# Patient Record
Sex: Female | Born: 1955 | Race: Black or African American | Hispanic: No | Marital: Single | Smoking: Former smoker
Health system: Southern US, Community
[De-identification: ages and names within clinical notes are randomized; demographics above are authoritative.]

## PROBLEM LIST (undated history)

## (undated) DIAGNOSIS — F99 Mental disorder, not otherwise specified: Secondary | ICD-10-CM

## (undated) DIAGNOSIS — T1491XA Suicide attempt, initial encounter: Secondary | ICD-10-CM

## (undated) DIAGNOSIS — J8410 Pulmonary fibrosis, unspecified: Secondary | ICD-10-CM

## (undated) DIAGNOSIS — M1990 Unspecified osteoarthritis, unspecified site: Secondary | ICD-10-CM

## (undated) DIAGNOSIS — R51 Headache: Secondary | ICD-10-CM

## (undated) DIAGNOSIS — J449 Chronic obstructive pulmonary disease, unspecified: Secondary | ICD-10-CM

## (undated) DIAGNOSIS — K621 Rectal polyp: Secondary | ICD-10-CM

## (undated) DIAGNOSIS — F32A Depression, unspecified: Secondary | ICD-10-CM

## (undated) DIAGNOSIS — C20 Malignant neoplasm of rectum: Secondary | ICD-10-CM

## (undated) DIAGNOSIS — F329 Major depressive disorder, single episode, unspecified: Secondary | ICD-10-CM

## (undated) DIAGNOSIS — K219 Gastro-esophageal reflux disease without esophagitis: Secondary | ICD-10-CM

## (undated) DIAGNOSIS — R52 Pain, unspecified: Secondary | ICD-10-CM

## (undated) DIAGNOSIS — J45909 Unspecified asthma, uncomplicated: Secondary | ICD-10-CM

## (undated) DIAGNOSIS — G629 Polyneuropathy, unspecified: Secondary | ICD-10-CM

## (undated) DIAGNOSIS — I10 Essential (primary) hypertension: Secondary | ICD-10-CM

## (undated) HISTORY — DX: Essential (primary) hypertension: I10

## (undated) HISTORY — PX: TONSILLECTOMY: SUR1361

## (undated) HISTORY — DX: Unspecified osteoarthritis, unspecified site: M19.90

## (undated) HISTORY — PX: TONSILLECTOMY AND ADENOIDECTOMY: SHX28

## (undated) HISTORY — DX: Mental disorder, not otherwise specified: F99

---

## 1998-01-28 ENCOUNTER — Encounter: Admission: RE | Admit: 1998-01-28 | Discharge: 1998-01-28 | Payer: Self-pay | Admitting: *Deleted

## 1998-03-27 ENCOUNTER — Encounter: Admission: RE | Admit: 1998-03-27 | Discharge: 1998-03-27 | Payer: Self-pay | Admitting: Internal Medicine

## 1998-04-10 ENCOUNTER — Encounter: Admission: RE | Admit: 1998-04-10 | Discharge: 1998-04-10 | Payer: Self-pay | Admitting: Internal Medicine

## 1998-04-30 ENCOUNTER — Encounter: Admission: RE | Admit: 1998-04-30 | Discharge: 1998-04-30 | Payer: Self-pay | Admitting: Internal Medicine

## 1998-09-21 ENCOUNTER — Encounter: Admission: RE | Admit: 1998-09-21 | Discharge: 1998-09-21 | Payer: Self-pay | Admitting: Internal Medicine

## 1998-09-24 ENCOUNTER — Ambulatory Visit (HOSPITAL_COMMUNITY): Admission: RE | Admit: 1998-09-24 | Discharge: 1998-09-24 | Payer: Self-pay

## 1998-10-06 ENCOUNTER — Encounter: Admission: RE | Admit: 1998-10-06 | Discharge: 1998-10-06 | Payer: Self-pay | Admitting: Hematology and Oncology

## 1998-10-15 ENCOUNTER — Encounter: Admission: RE | Admit: 1998-10-15 | Discharge: 1998-10-15 | Payer: Self-pay | Admitting: Internal Medicine

## 1999-03-03 ENCOUNTER — Encounter: Admission: RE | Admit: 1999-03-03 | Discharge: 1999-03-03 | Payer: Self-pay | Admitting: Internal Medicine

## 1999-03-03 ENCOUNTER — Encounter: Payer: Self-pay | Admitting: Internal Medicine

## 1999-03-03 ENCOUNTER — Ambulatory Visit (HOSPITAL_COMMUNITY): Admission: RE | Admit: 1999-03-03 | Discharge: 1999-03-03 | Payer: Self-pay | Admitting: Internal Medicine

## 1999-04-07 ENCOUNTER — Ambulatory Visit (HOSPITAL_COMMUNITY): Admission: RE | Admit: 1999-04-07 | Discharge: 1999-04-07 | Payer: Self-pay | Admitting: Internal Medicine

## 1999-04-07 ENCOUNTER — Encounter: Admission: RE | Admit: 1999-04-07 | Discharge: 1999-04-07 | Payer: Self-pay | Admitting: Internal Medicine

## 1999-04-12 ENCOUNTER — Ambulatory Visit (HOSPITAL_COMMUNITY): Admission: RE | Admit: 1999-04-12 | Discharge: 1999-04-12 | Payer: Self-pay | Admitting: *Deleted

## 1999-05-25 ENCOUNTER — Other Ambulatory Visit: Admission: RE | Admit: 1999-05-25 | Discharge: 1999-05-25 | Payer: Self-pay | Admitting: *Deleted

## 1999-05-25 ENCOUNTER — Encounter: Admission: RE | Admit: 1999-05-25 | Discharge: 1999-05-25 | Payer: Self-pay | Admitting: Internal Medicine

## 1999-06-08 ENCOUNTER — Encounter: Admission: RE | Admit: 1999-06-08 | Discharge: 1999-06-08 | Payer: Self-pay | Admitting: Hematology and Oncology

## 1999-06-30 ENCOUNTER — Encounter: Admission: RE | Admit: 1999-06-30 | Discharge: 1999-06-30 | Payer: Self-pay | Admitting: *Deleted

## 1999-07-04 ENCOUNTER — Ambulatory Visit (HOSPITAL_COMMUNITY): Admission: RE | Admit: 1999-07-04 | Discharge: 1999-07-04 | Payer: Self-pay | Admitting: *Deleted

## 1999-11-16 ENCOUNTER — Encounter: Admission: RE | Admit: 1999-11-16 | Discharge: 1999-11-16 | Payer: Self-pay | Admitting: Internal Medicine

## 1999-12-06 ENCOUNTER — Ambulatory Visit (HOSPITAL_COMMUNITY): Admission: RE | Admit: 1999-12-06 | Discharge: 1999-12-06 | Payer: Self-pay | Admitting: *Deleted

## 2000-04-05 ENCOUNTER — Encounter: Admission: RE | Admit: 2000-04-05 | Discharge: 2000-04-05 | Payer: Self-pay | Admitting: Internal Medicine

## 2000-04-10 ENCOUNTER — Ambulatory Visit (HOSPITAL_COMMUNITY): Admission: RE | Admit: 2000-04-10 | Discharge: 2000-04-10 | Payer: Self-pay | Admitting: Internal Medicine

## 2000-04-27 ENCOUNTER — Encounter: Admission: RE | Admit: 2000-04-27 | Discharge: 2000-04-27 | Payer: Self-pay | Admitting: Hematology and Oncology

## 2000-05-26 ENCOUNTER — Encounter: Admission: RE | Admit: 2000-05-26 | Discharge: 2000-05-26 | Payer: Self-pay | Admitting: Internal Medicine

## 2000-06-28 ENCOUNTER — Ambulatory Visit (HOSPITAL_COMMUNITY): Admission: RE | Admit: 2000-06-28 | Discharge: 2000-06-28 | Payer: Self-pay | Admitting: *Deleted

## 2000-08-31 ENCOUNTER — Encounter: Admission: RE | Admit: 2000-08-31 | Discharge: 2000-08-31 | Payer: Self-pay | Admitting: Hematology and Oncology

## 2000-10-13 ENCOUNTER — Encounter: Admission: RE | Admit: 2000-10-13 | Discharge: 2000-10-13 | Payer: Self-pay | Admitting: Internal Medicine

## 2000-10-13 ENCOUNTER — Other Ambulatory Visit: Admission: RE | Admit: 2000-10-13 | Discharge: 2000-10-13 | Payer: Self-pay | Admitting: Internal Medicine

## 2000-10-19 ENCOUNTER — Ambulatory Visit (HOSPITAL_BASED_OUTPATIENT_CLINIC_OR_DEPARTMENT_OTHER): Admission: RE | Admit: 2000-10-19 | Discharge: 2000-10-19 | Payer: Self-pay | Admitting: *Deleted

## 2001-01-22 ENCOUNTER — Encounter: Admission: RE | Admit: 2001-01-22 | Discharge: 2001-01-22 | Payer: Self-pay | Admitting: Internal Medicine

## 2001-04-02 ENCOUNTER — Encounter: Admission: RE | Admit: 2001-04-02 | Discharge: 2001-04-02 | Payer: Self-pay

## 2001-08-10 ENCOUNTER — Encounter: Admission: RE | Admit: 2001-08-10 | Discharge: 2001-08-10 | Payer: Self-pay

## 2001-10-18 ENCOUNTER — Encounter: Admission: RE | Admit: 2001-10-18 | Discharge: 2001-10-18 | Payer: Self-pay | Admitting: Internal Medicine

## 2001-10-23 ENCOUNTER — Encounter: Admission: RE | Admit: 2001-10-23 | Discharge: 2001-10-23 | Payer: Self-pay | Admitting: Internal Medicine

## 2001-12-15 ENCOUNTER — Encounter: Payer: Self-pay | Admitting: Emergency Medicine

## 2001-12-15 ENCOUNTER — Emergency Department (HOSPITAL_COMMUNITY): Admission: EM | Admit: 2001-12-15 | Discharge: 2001-12-15 | Payer: Self-pay | Admitting: Emergency Medicine

## 2002-01-15 ENCOUNTER — Encounter: Admission: RE | Admit: 2002-01-15 | Discharge: 2002-01-15 | Payer: Self-pay | Admitting: Internal Medicine

## 2002-03-11 ENCOUNTER — Emergency Department (HOSPITAL_COMMUNITY): Admission: EM | Admit: 2002-03-11 | Discharge: 2002-03-11 | Payer: Self-pay | Admitting: Emergency Medicine

## 2002-03-11 ENCOUNTER — Encounter: Payer: Self-pay | Admitting: Emergency Medicine

## 2002-03-15 ENCOUNTER — Encounter: Admission: RE | Admit: 2002-03-15 | Discharge: 2002-03-15 | Payer: Self-pay | Admitting: Internal Medicine

## 2002-04-16 ENCOUNTER — Encounter: Admission: RE | Admit: 2002-04-16 | Discharge: 2002-04-16 | Payer: Self-pay | Admitting: Internal Medicine

## 2002-07-02 ENCOUNTER — Encounter: Admission: RE | Admit: 2002-07-02 | Discharge: 2002-07-02 | Payer: Self-pay | Admitting: Internal Medicine

## 2002-07-15 ENCOUNTER — Encounter: Admission: RE | Admit: 2002-07-15 | Discharge: 2002-07-15 | Payer: Self-pay | Admitting: Internal Medicine

## 2002-08-07 ENCOUNTER — Encounter: Admission: RE | Admit: 2002-08-07 | Discharge: 2002-08-07 | Payer: Self-pay | Admitting: Internal Medicine

## 2002-08-26 ENCOUNTER — Encounter: Admission: RE | Admit: 2002-08-26 | Discharge: 2002-08-26 | Payer: Self-pay | Admitting: Internal Medicine

## 2002-08-28 ENCOUNTER — Ambulatory Visit (HOSPITAL_COMMUNITY): Admission: RE | Admit: 2002-08-28 | Discharge: 2002-08-28 | Payer: Self-pay | Admitting: *Deleted

## 2002-09-04 ENCOUNTER — Ambulatory Visit (HOSPITAL_COMMUNITY): Admission: RE | Admit: 2002-09-04 | Discharge: 2002-09-04 | Payer: Self-pay | Admitting: Internal Medicine

## 2002-11-25 ENCOUNTER — Encounter: Admission: RE | Admit: 2002-11-25 | Discharge: 2002-11-25 | Payer: Self-pay | Admitting: Internal Medicine

## 2002-12-02 ENCOUNTER — Encounter: Admission: RE | Admit: 2002-12-02 | Discharge: 2002-12-02 | Payer: Self-pay | Admitting: Internal Medicine

## 2003-02-24 ENCOUNTER — Emergency Department (HOSPITAL_COMMUNITY): Admission: EM | Admit: 2003-02-24 | Discharge: 2003-02-25 | Payer: Self-pay | Admitting: Emergency Medicine

## 2003-03-23 ENCOUNTER — Emergency Department (HOSPITAL_COMMUNITY): Admission: EM | Admit: 2003-03-23 | Discharge: 2003-03-24 | Payer: Self-pay | Admitting: Emergency Medicine

## 2003-03-23 ENCOUNTER — Encounter: Payer: Self-pay | Admitting: Emergency Medicine

## 2003-03-24 ENCOUNTER — Encounter: Admission: RE | Admit: 2003-03-24 | Discharge: 2003-03-24 | Payer: Self-pay | Admitting: Internal Medicine

## 2003-04-02 ENCOUNTER — Emergency Department (HOSPITAL_COMMUNITY): Admission: AD | Admit: 2003-04-02 | Discharge: 2003-04-02 | Payer: Self-pay | Admitting: Family Medicine

## 2003-04-02 ENCOUNTER — Encounter: Payer: Self-pay | Admitting: Family Medicine

## 2003-04-07 ENCOUNTER — Encounter: Admission: RE | Admit: 2003-04-07 | Discharge: 2003-04-07 | Payer: Self-pay | Admitting: Internal Medicine

## 2004-01-09 ENCOUNTER — Ambulatory Visit (HOSPITAL_COMMUNITY): Admission: RE | Admit: 2004-01-09 | Discharge: 2004-01-09 | Payer: Self-pay | Admitting: Internal Medicine

## 2004-01-09 ENCOUNTER — Encounter: Admission: RE | Admit: 2004-01-09 | Discharge: 2004-01-09 | Payer: Self-pay | Admitting: Internal Medicine

## 2004-02-12 ENCOUNTER — Ambulatory Visit (HOSPITAL_COMMUNITY): Admission: RE | Admit: 2004-02-12 | Discharge: 2004-02-12 | Payer: Self-pay | Admitting: Internal Medicine

## 2004-03-26 ENCOUNTER — Ambulatory Visit: Payer: Self-pay | Admitting: Internal Medicine

## 2004-08-26 ENCOUNTER — Ambulatory Visit: Payer: Self-pay | Admitting: Internal Medicine

## 2004-11-04 ENCOUNTER — Ambulatory Visit: Payer: Self-pay | Admitting: Internal Medicine

## 2005-12-08 ENCOUNTER — Ambulatory Visit: Payer: Self-pay | Admitting: Internal Medicine

## 2005-12-08 ENCOUNTER — Encounter (INDEPENDENT_AMBULATORY_CARE_PROVIDER_SITE_OTHER): Payer: Self-pay | Admitting: Internal Medicine

## 2008-01-24 ENCOUNTER — Emergency Department (HOSPITAL_COMMUNITY): Admission: EM | Admit: 2008-01-24 | Discharge: 2008-01-24 | Payer: Self-pay | Admitting: Family Medicine

## 2008-01-24 IMAGING — CR DG CHEST 2V
2 series · 2 of 2 positions shown · non-contrast
Comparison: [DATE] study report

CLINICAL DATA: History given of shortness of breath, chest pain,
and palpitations.  History of hypertension and tobacco smoking.

CHEST - 2 VIEW

[w chest pa]
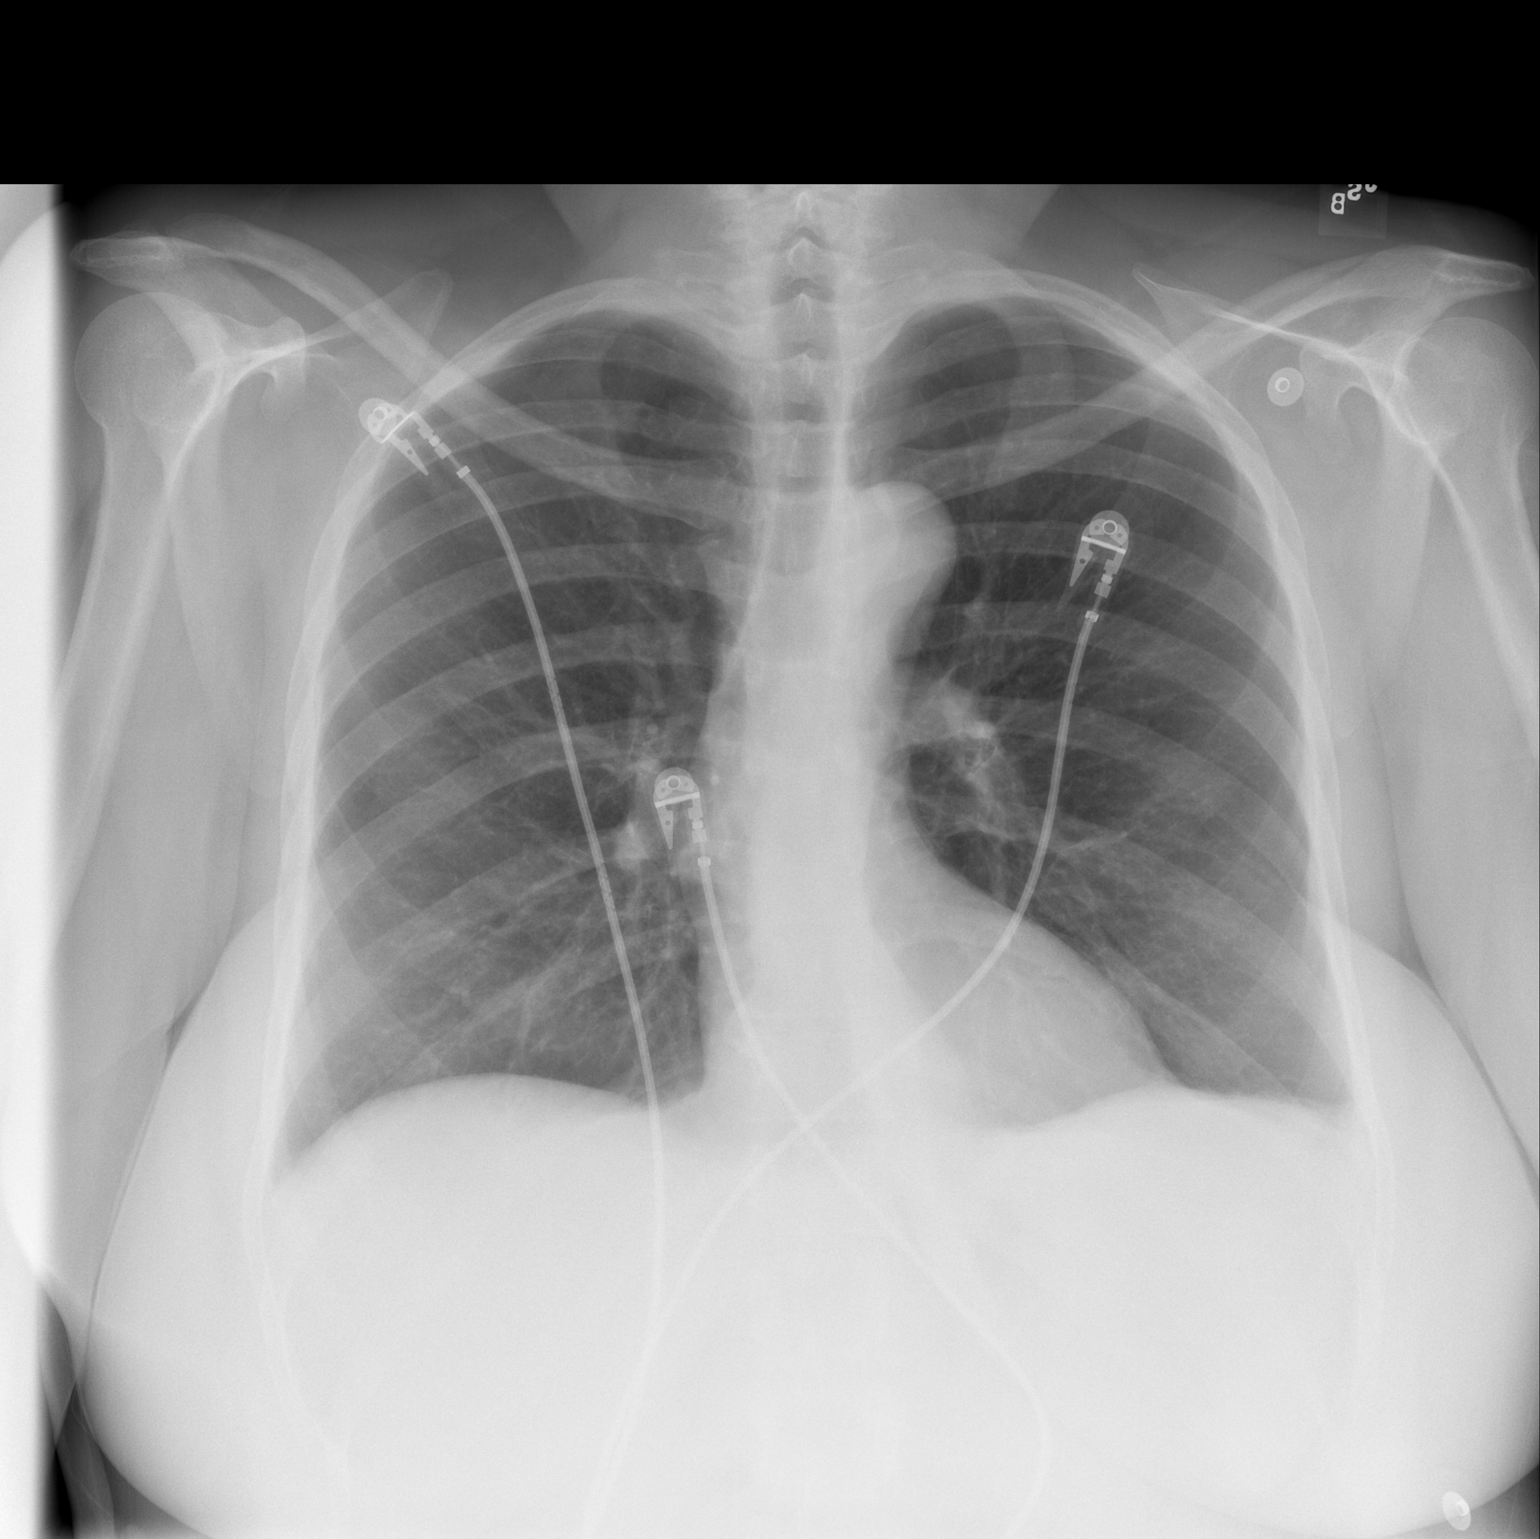

[w chest lat]
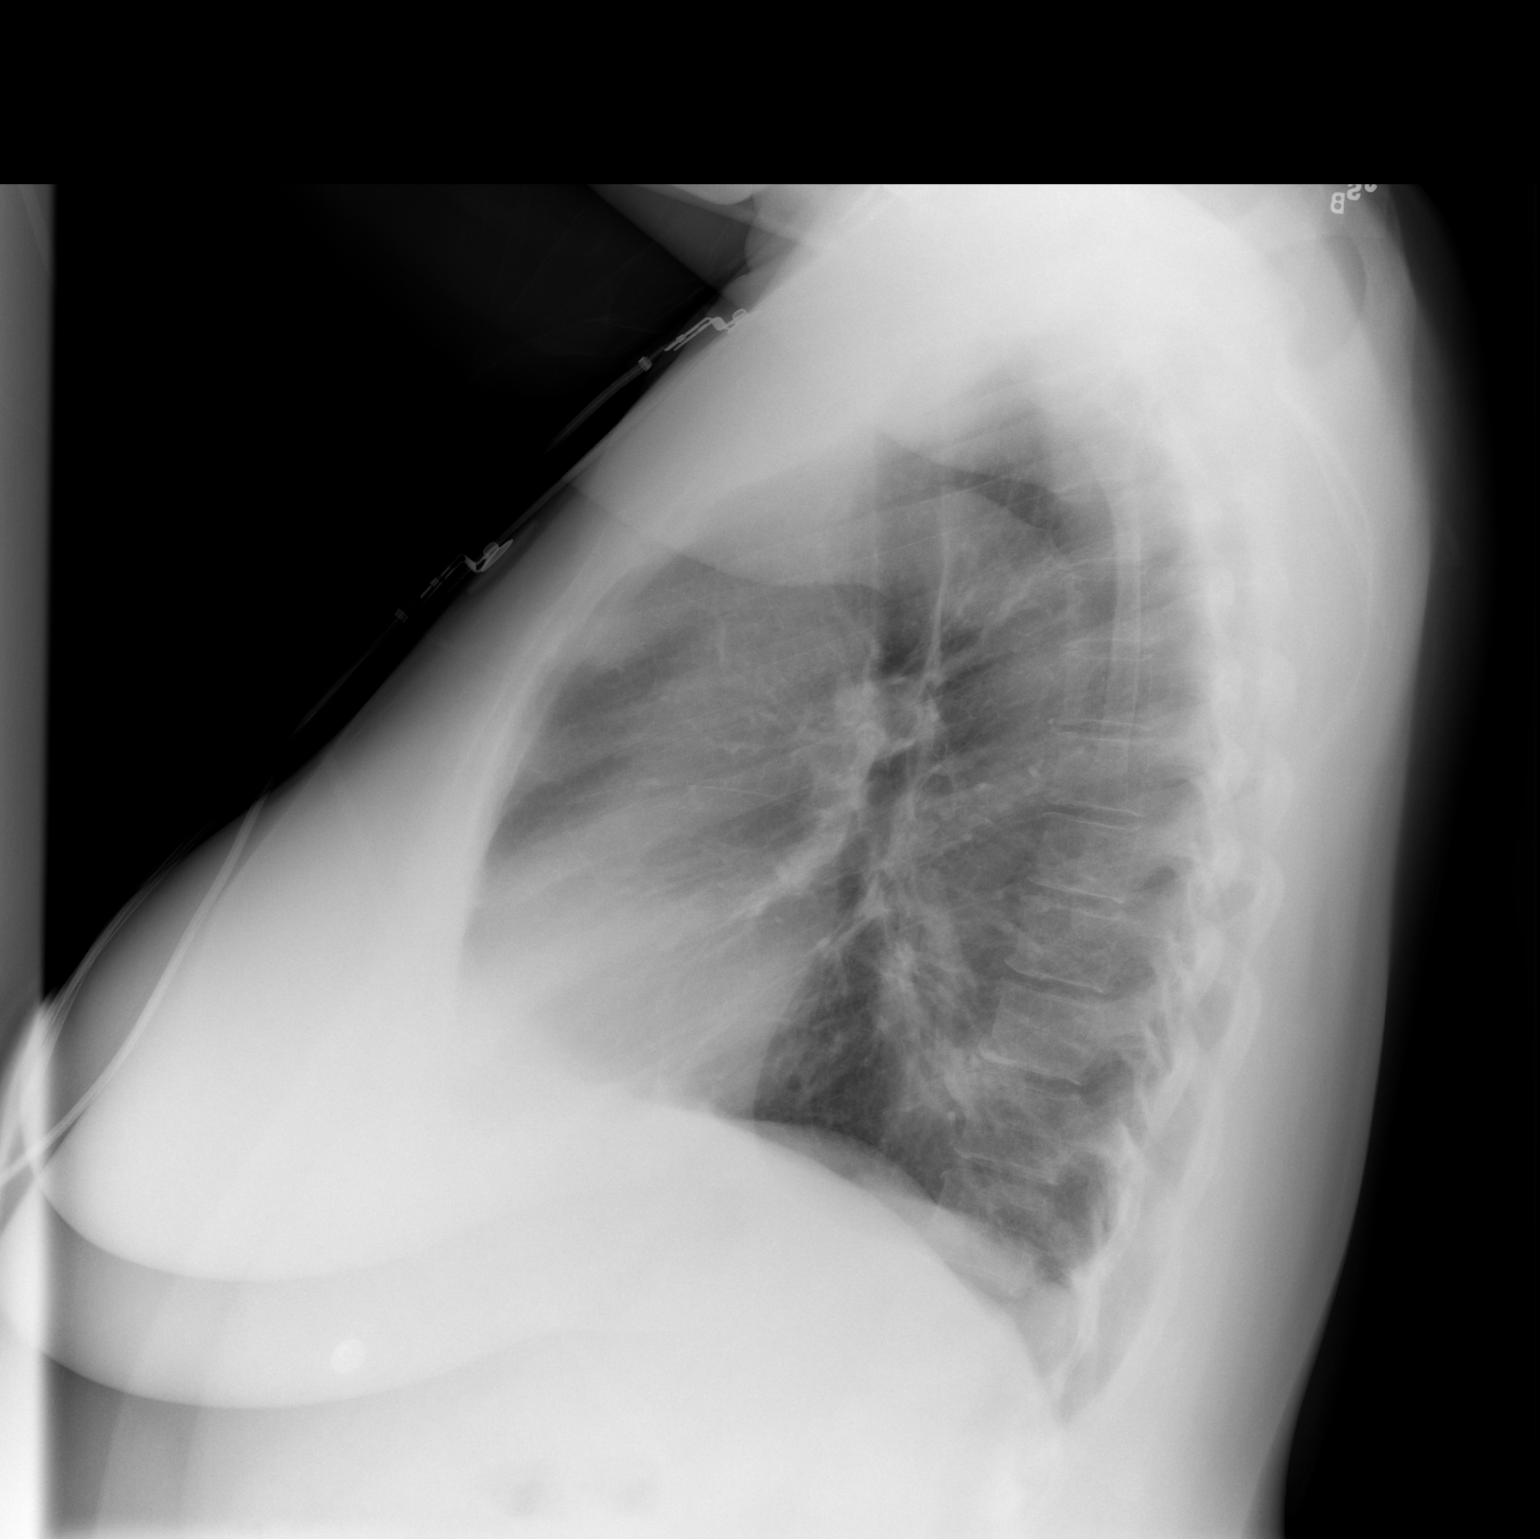

[2 of 2 positions shown; findings below may reference images not displayed]

FINDINGS: Cardiac silhouette is not enlarged.  The lungs are free
of infiltrates.  No pleural effusion is seen.  Bones appear average
for age.  There is tortuosity of the thoracic aorta.
IMPRESSION: No acute process seen.

## 2008-01-24 IMAGING — CR DG KNEE COMPLETE 4+V*R*
4 series · 4 of 4 positions shown · non-contrast
Comparison: None

CLINICAL DATA: Knee pain status post fall.

RIGHT KNEE - COMPLETE 4+ VIEW

[t knee ap right]
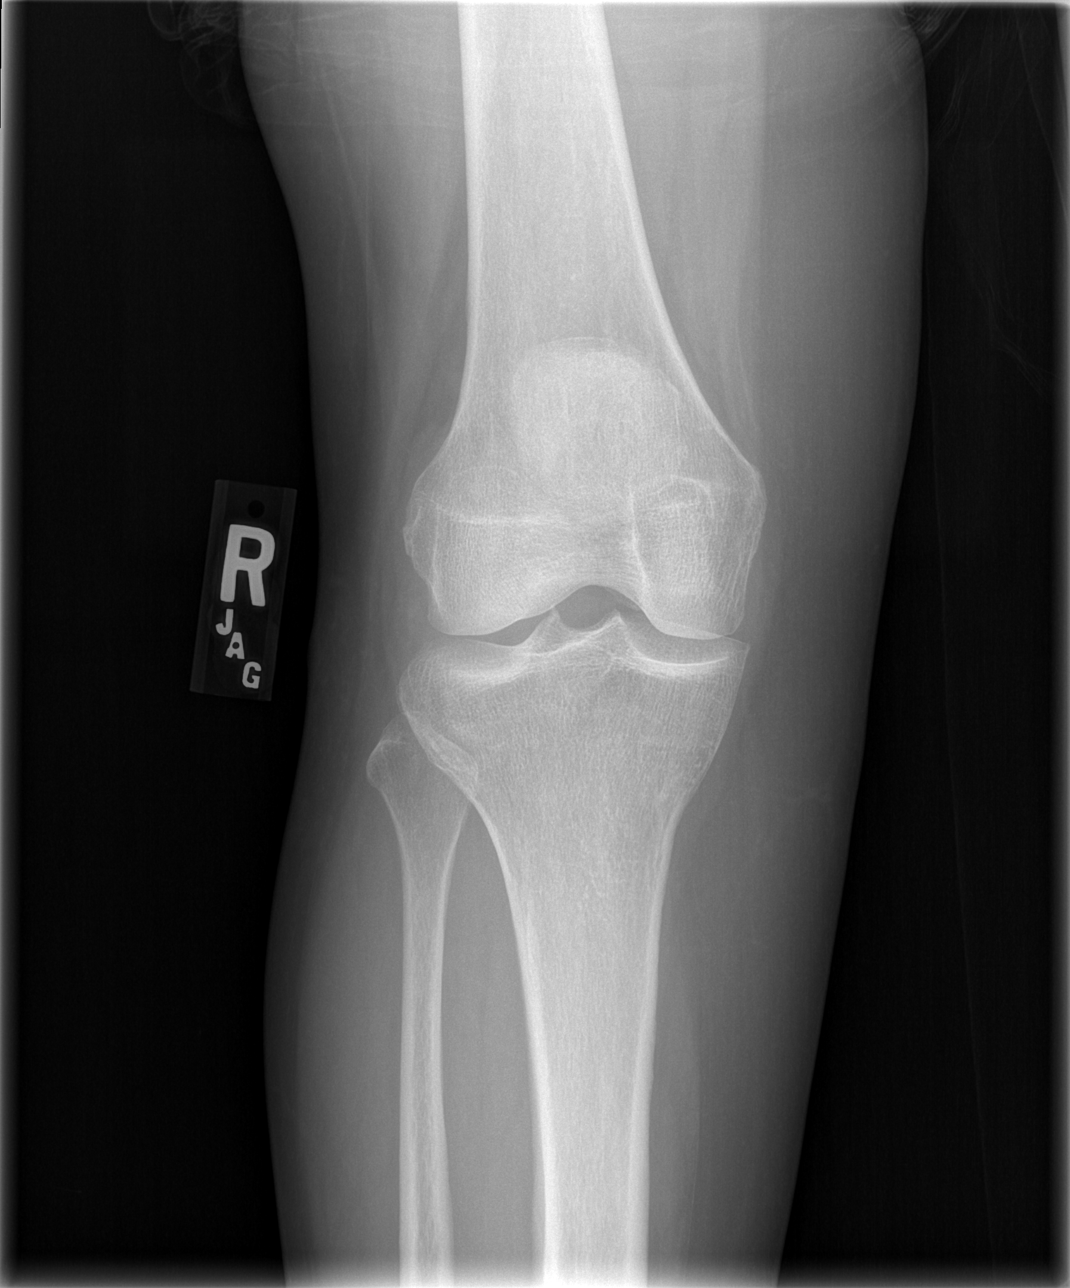

[t knee oblique right (1 of 2)]
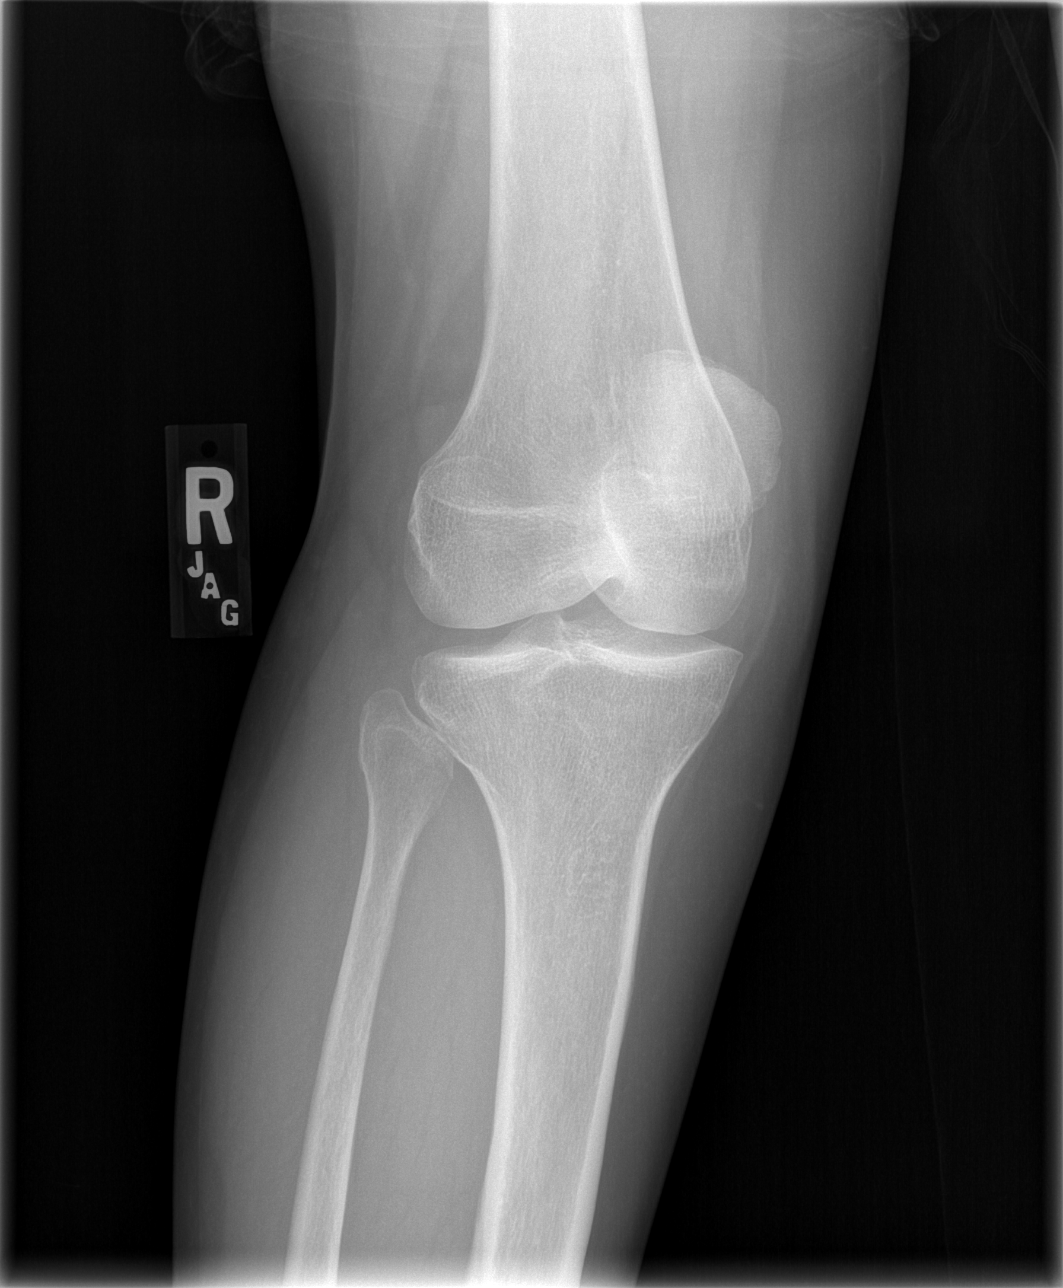

[t knee oblique right (2 of 2)]
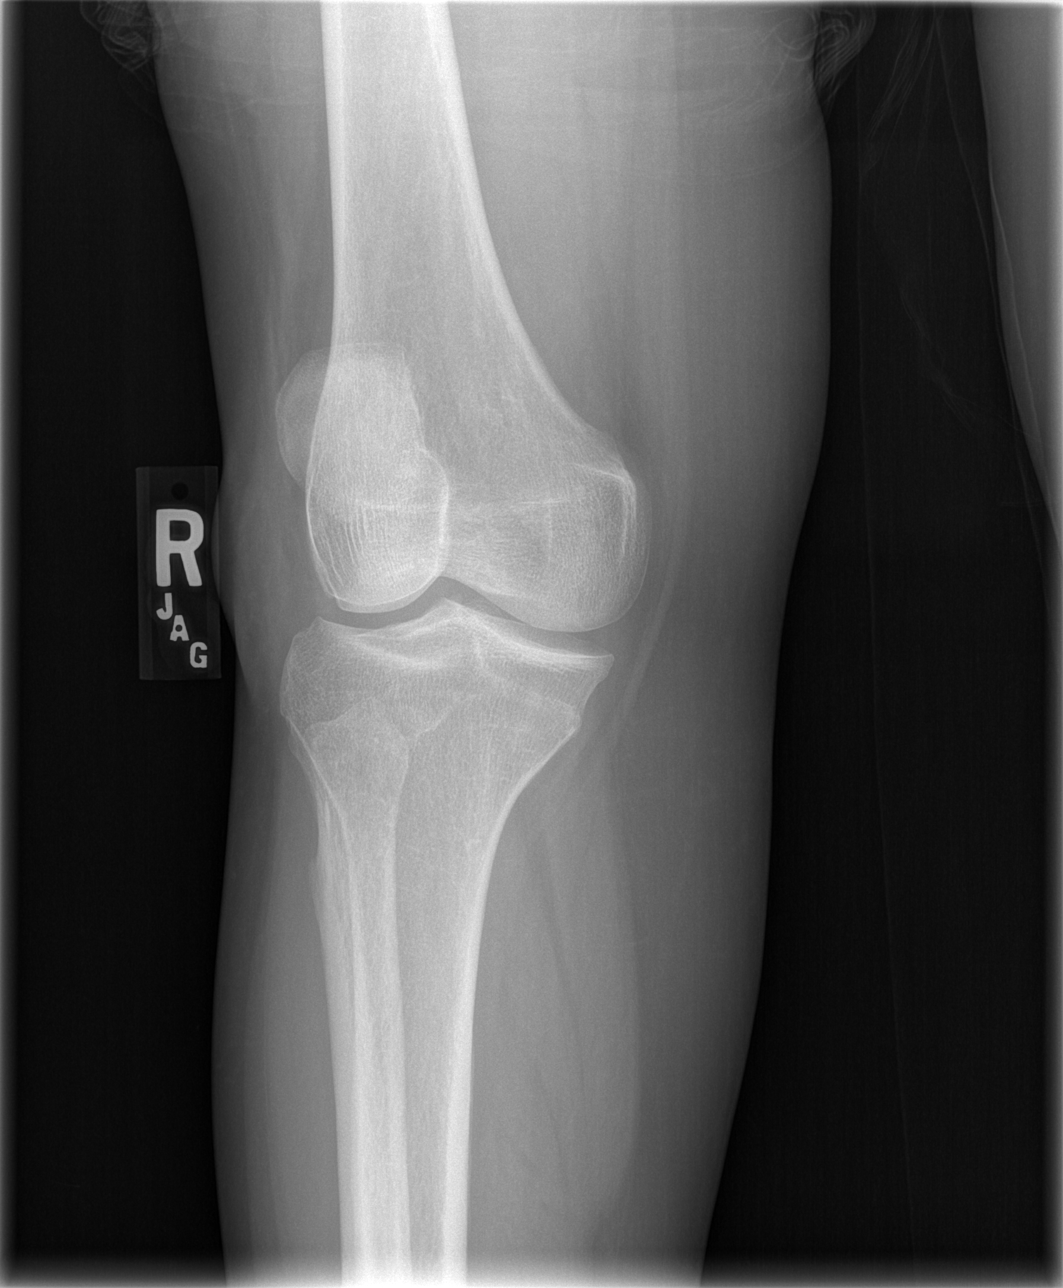

[t knee lat right]
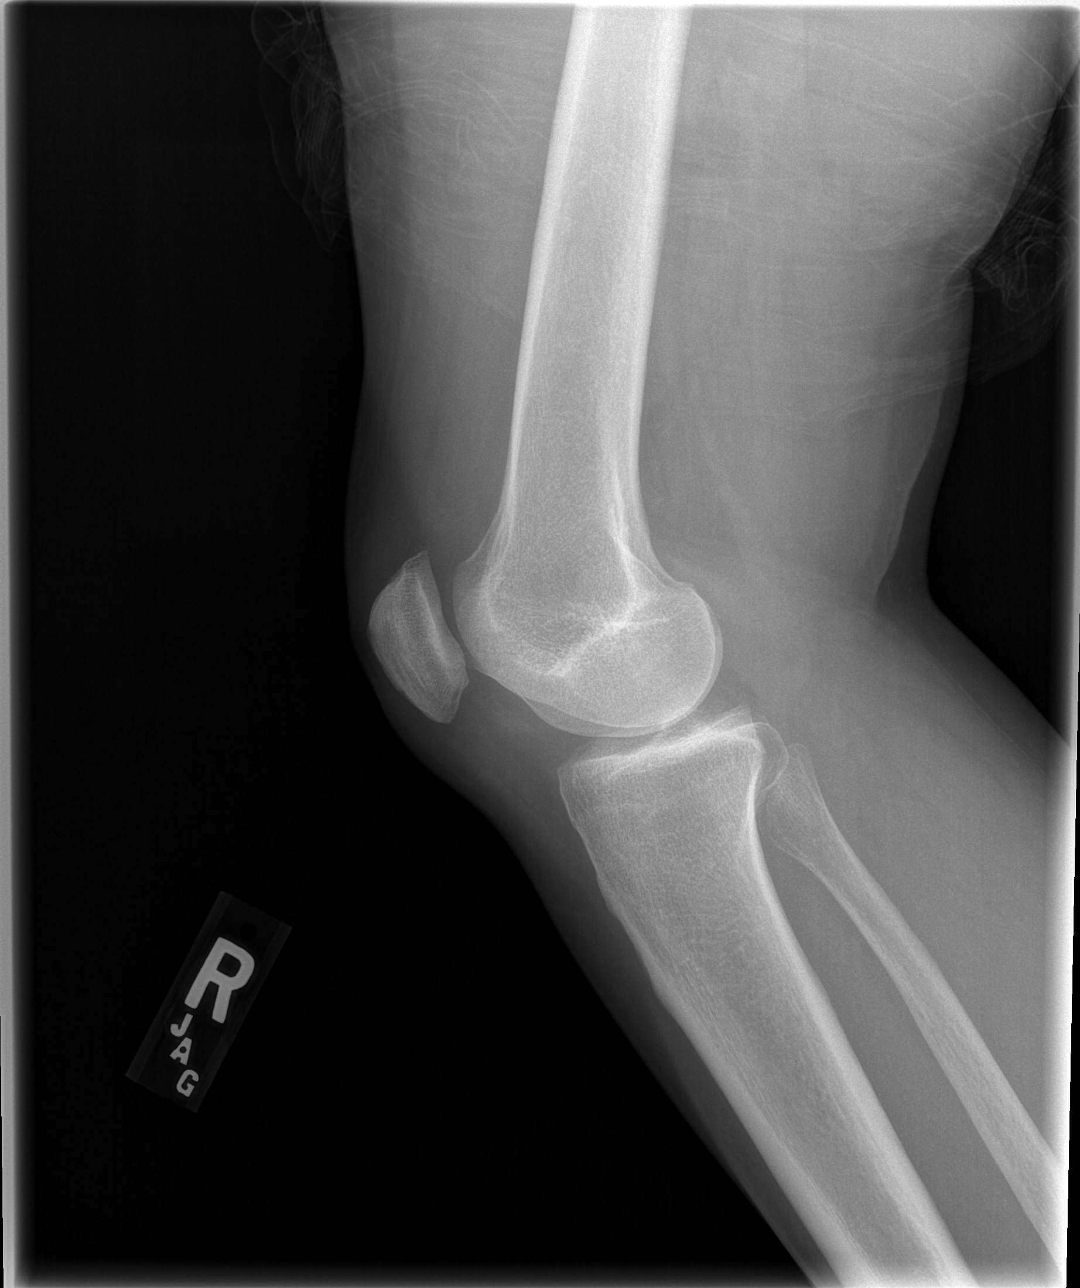

[4 of 4 positions shown; findings below may reference images not displayed]

FINDINGS: There is a small knee joint effusion.  No acute fracture
or dislocation is demonstrated.  The joint spaces appear preserved.
IMPRESSION: Small knee joint effusion.  No acute osseous findings.

## 2008-01-28 ENCOUNTER — Ambulatory Visit: Payer: Self-pay | Admitting: Family Medicine

## 2008-01-28 ENCOUNTER — Ambulatory Visit: Payer: Self-pay | Admitting: *Deleted

## 2008-01-28 LAB — CONVERTED CEMR LAB: Microalb, Ur: 3.57 mg/dL — ABNORMAL HIGH (ref 0.00–1.89)

## 2008-02-01 ENCOUNTER — Ambulatory Visit: Payer: Self-pay | Admitting: Internal Medicine

## 2008-02-05 ENCOUNTER — Ambulatory Visit: Payer: Self-pay | Admitting: Family Medicine

## 2008-06-09 ENCOUNTER — Ambulatory Visit: Payer: Self-pay | Admitting: Family Medicine

## 2008-07-15 ENCOUNTER — Ambulatory Visit: Payer: Self-pay | Admitting: Internal Medicine

## 2008-08-21 ENCOUNTER — Ambulatory Visit: Payer: Self-pay | Admitting: Internal Medicine

## 2008-08-21 ENCOUNTER — Encounter (INDEPENDENT_AMBULATORY_CARE_PROVIDER_SITE_OTHER): Payer: Self-pay | Admitting: Family Medicine

## 2008-08-21 LAB — CONVERTED CEMR LAB
ALT: 11 units/L (ref 0–35)
AST: 14 units/L (ref 0–37)
Albumin: 4.1 g/dL (ref 3.5–5.2)
Alkaline Phosphatase: 66 units/L (ref 39–117)
BUN: 15 mg/dL (ref 6–23)
Basophils Absolute: 0 10*3/uL (ref 0.0–0.1)
Basophils Relative: 0 % (ref 0–1)
CO2: 21 meq/L (ref 19–32)
Calcium: 9 mg/dL (ref 8.4–10.5)
Chloride: 109 meq/L (ref 96–112)
Cholesterol: 148 mg/dL (ref 0–200)
Creatinine, Ser: 1.18 mg/dL (ref 0.40–1.20)
Eosinophils Absolute: 0.1 10*3/uL (ref 0.0–0.7)
Eosinophils Relative: 3 % (ref 0–5)
Glucose, Bld: 101 mg/dL — ABNORMAL HIGH (ref 70–99)
HCT: 31.4 % — ABNORMAL LOW (ref 36.0–46.0)
HDL: 56 mg/dL (ref >39)
Hemoglobin: 11.3 g/dL — ABNORMAL LOW (ref 12.0–15.0)
LDL Cholesterol: 67 mg/dL (ref 0–99)
Lymphocytes Relative: 26 % (ref 12–46)
Lymphs Abs: 1.1 10*3/uL (ref 0.7–4.0)
MCHC: 36 g/dL (ref 30.0–36.0)
MCV: 92.4 fL (ref 78.0–100.0)
Monocytes Absolute: 0.3 10*3/uL (ref 0.1–1.0)
Monocytes Relative: 6 % (ref 3–12)
Neutro Abs: 2.6 10*3/uL (ref 1.7–7.7)
Neutrophils Relative %: 65 % (ref 43–77)
Platelets: 185 10*3/uL (ref 150–400)
Potassium: 3.5 meq/L (ref 3.5–5.3)
RBC: 3.4 M/uL — ABNORMAL LOW (ref 3.87–5.11)
RDW: 15.5 % (ref 11.5–15.5)
Sodium: 143 meq/L (ref 135–145)
TSH: 2.562 microintl units/mL (ref 0.350–4.500)
Total Bilirubin: 1.4 mg/dL — ABNORMAL HIGH (ref 0.3–1.2)
Total CHOL/HDL Ratio: 2.6
Total Protein: 7.1 g/dL (ref 6.0–8.3)
Triglycerides: 127 mg/dL (ref <150)
VLDL: 25 mg/dL (ref 0–40)
Vit D, 25-Hydroxy: 7 ng/mL — ABNORMAL LOW (ref 30–89)
WBC: 4.1 10*3/uL (ref 4.0–10.5)

## 2008-09-26 ENCOUNTER — Encounter (INDEPENDENT_AMBULATORY_CARE_PROVIDER_SITE_OTHER): Payer: Self-pay | Admitting: Family Medicine

## 2008-09-26 ENCOUNTER — Ambulatory Visit: Payer: Self-pay | Admitting: Family Medicine

## 2008-10-15 ENCOUNTER — Ambulatory Visit (HOSPITAL_COMMUNITY): Admission: RE | Admit: 2008-10-15 | Discharge: 2008-10-15 | Payer: Self-pay | Admitting: Family Medicine

## 2008-10-15 IMAGING — MG MM DIGITAL SCREENING BILAT W/ CAD
5 series · 5 of 5 positions shown · non-contrast
Comparison: none

DG SCREEN MAMMOGRAM BILATERAL
Bilateral CC and MLO view(s) were taken.
Technologist: PACHECO ARAYA, RT, RM

DIGITAL SCREENING MAMMOGRAM WITH CAD:
The breast tissue is almost entirely fatty.  No masses or malignant type calcifications are 
identified.  Compared with prior studies.

[R CC]
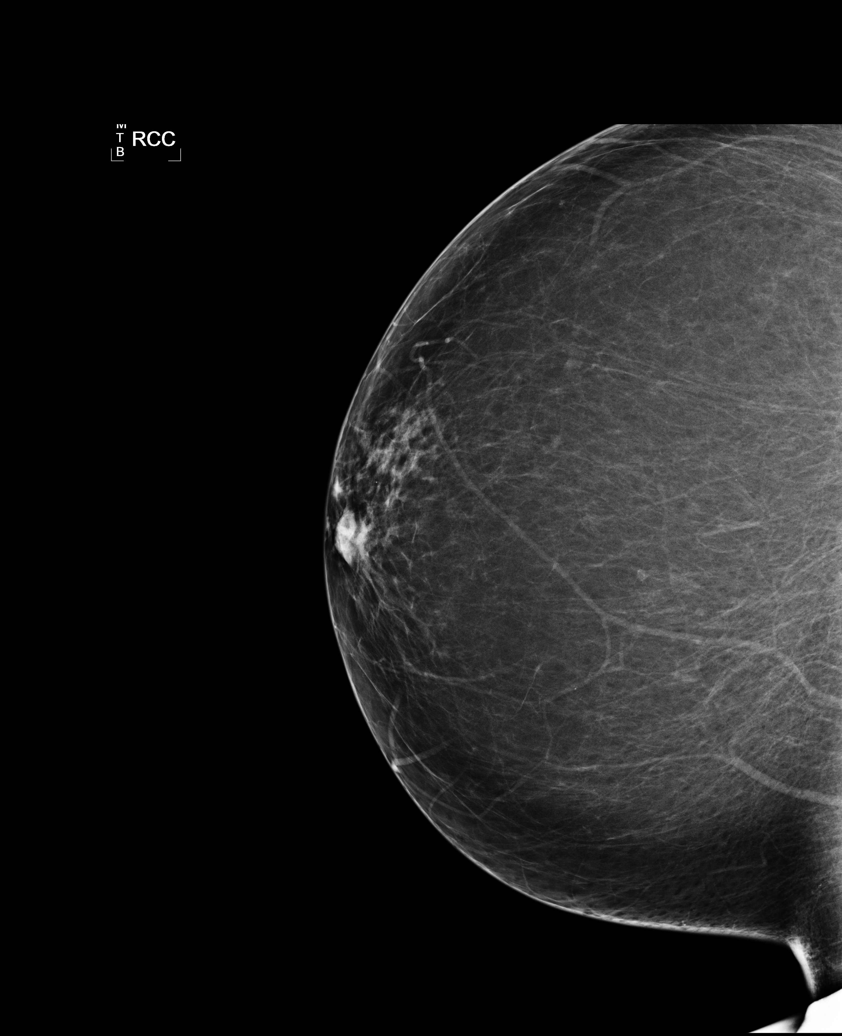

[R MLO]
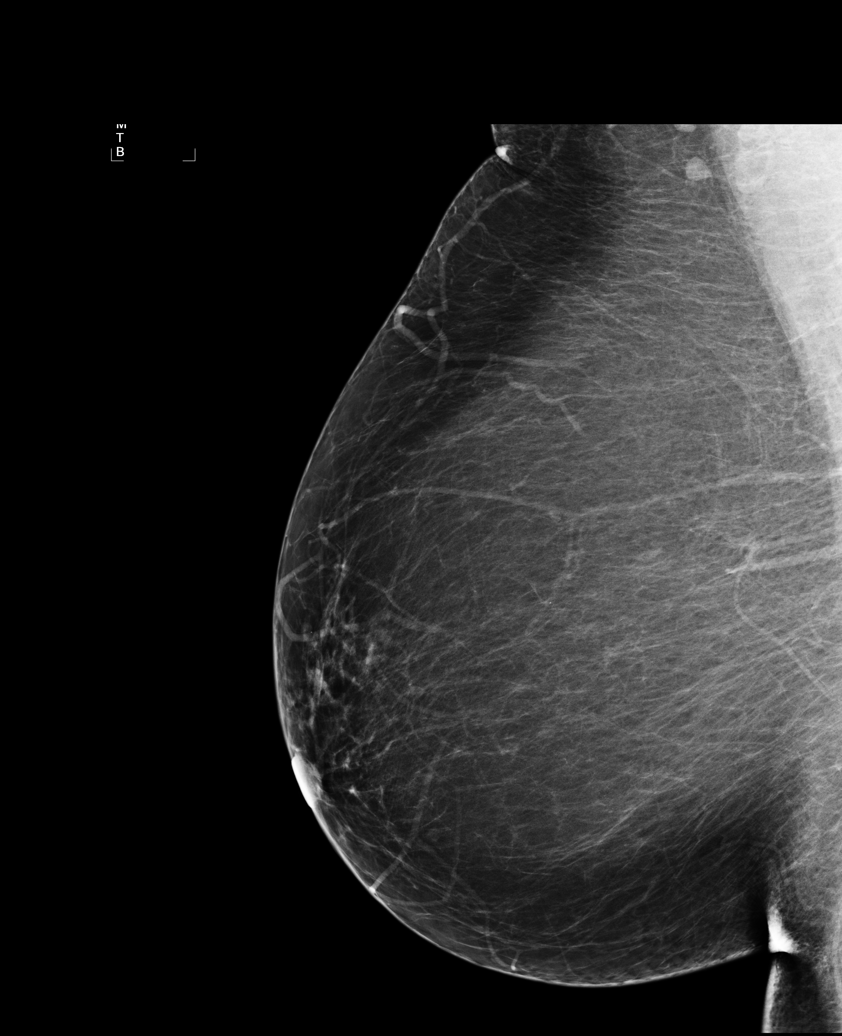

[L CC]
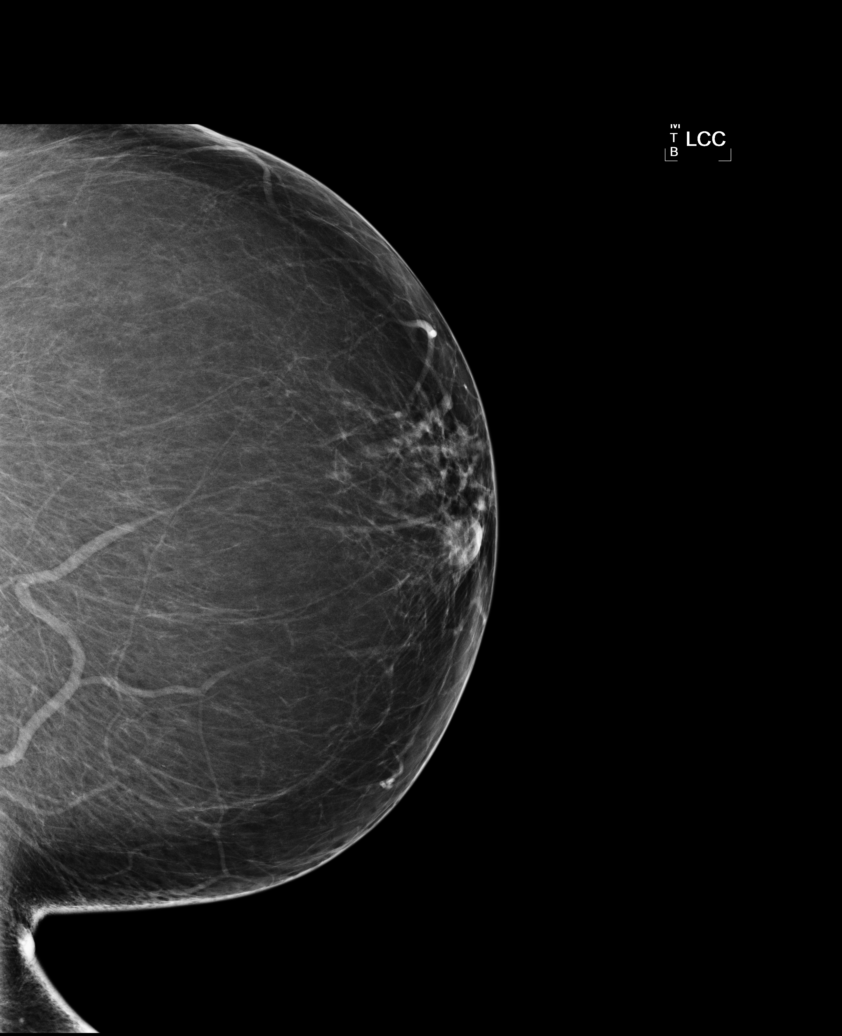

[L MLO (1 of 2)]
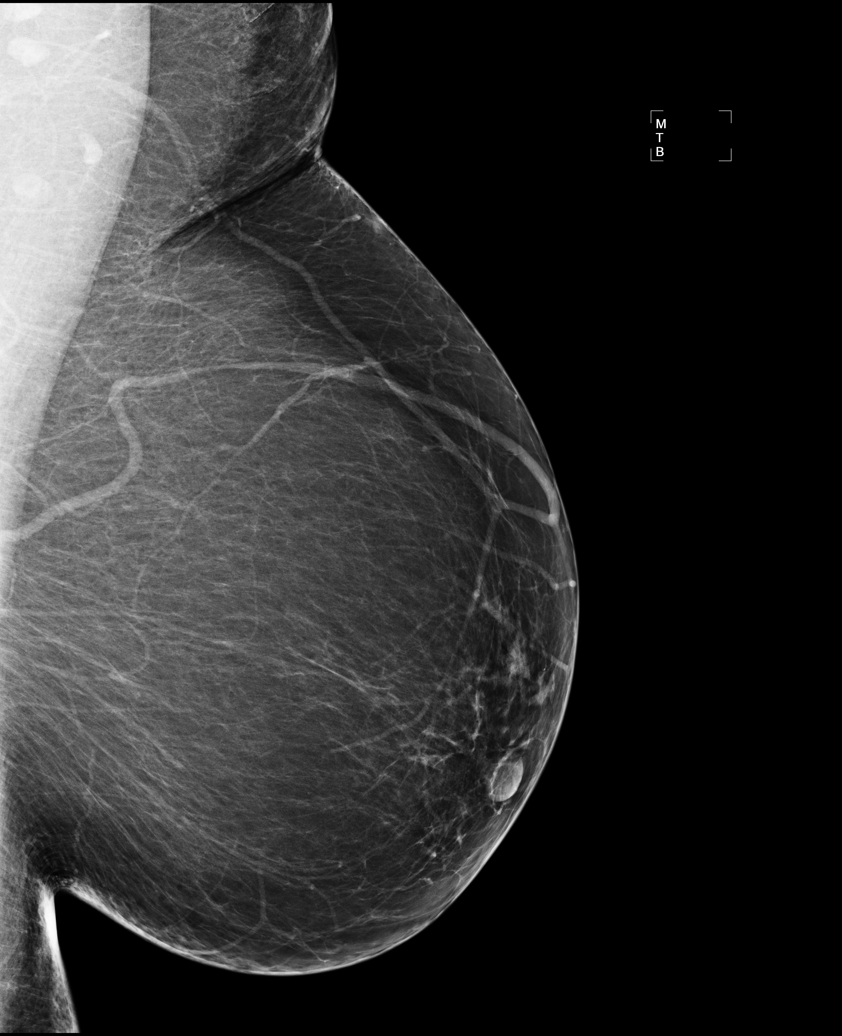

[L MLO (2 of 2)]
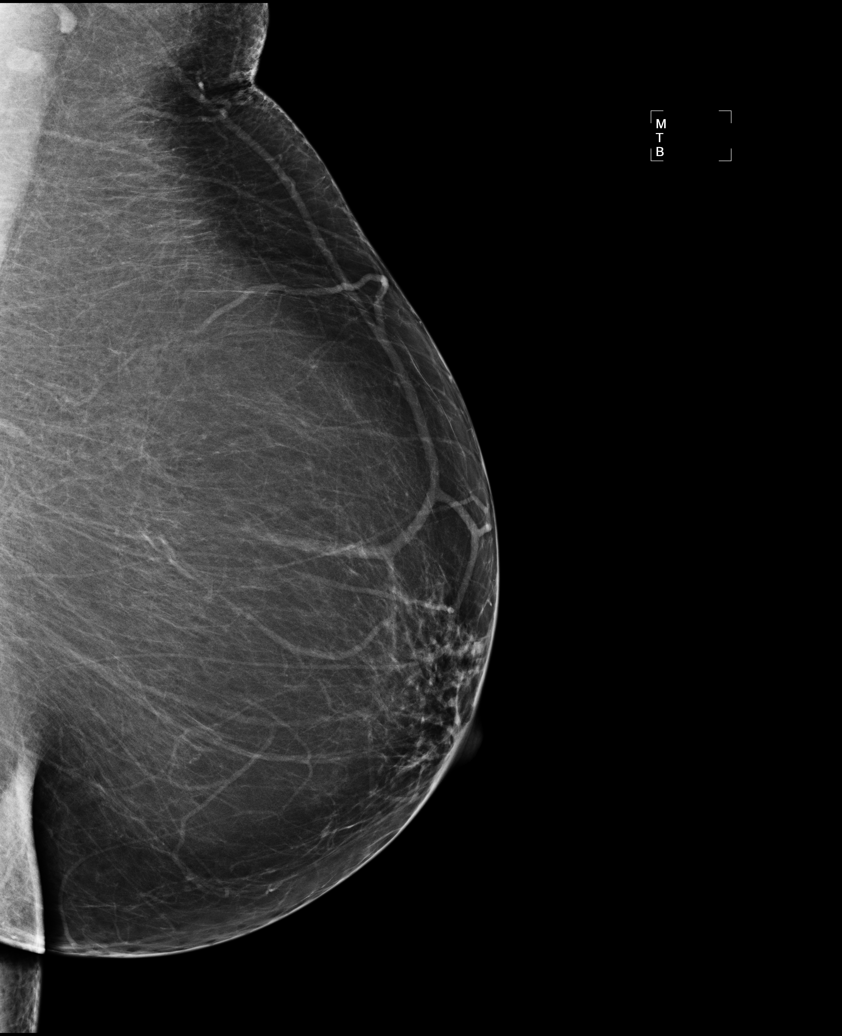

[5 of 5 positions shown; findings below may reference images not displayed]

IMPRESSION: No specific mammographic evidence of malignancy.  Next screening mammogram is recommended in one 
year.

A result letter of this screening mammogram will be mailed directly to the patient.

ASSESSMENT: Negative - BI-RADS 1

Screening mammogram in 1 year.
ANALYZED BY COMPUTER AIDED DETECTION. , THIS PROCEDURE WAS A DIGITAL MAMMOGRAM.

## 2009-01-16 ENCOUNTER — Ambulatory Visit: Payer: Self-pay | Admitting: Family Medicine

## 2009-01-16 LAB — CONVERTED CEMR LAB
Microalb, Ur: 4.69 mg/dL — ABNORMAL HIGH (ref 0.00–1.89)
Sed Rate: 8 mm/hr (ref 0–22)
Vit D, 25-Hydroxy: 29 ng/mL — ABNORMAL LOW (ref 30–89)

## 2009-01-17 ENCOUNTER — Encounter (INDEPENDENT_AMBULATORY_CARE_PROVIDER_SITE_OTHER): Payer: Self-pay | Admitting: Family Medicine

## 2009-04-14 ENCOUNTER — Ambulatory Visit: Payer: Self-pay | Admitting: Internal Medicine

## 2009-08-11 ENCOUNTER — Ambulatory Visit: Payer: Self-pay | Admitting: Internal Medicine

## 2009-08-18 ENCOUNTER — Ambulatory Visit: Payer: Self-pay | Admitting: Family Medicine

## 2009-08-26 ENCOUNTER — Ambulatory Visit: Payer: Self-pay | Admitting: Internal Medicine

## 2009-08-26 ENCOUNTER — Ambulatory Visit (HOSPITAL_COMMUNITY): Admission: RE | Admit: 2009-08-26 | Discharge: 2009-08-26 | Payer: Self-pay | Admitting: Family Medicine

## 2009-08-26 IMAGING — CR DG FOOT COMPLETE 3+V*R*
3 series · 3 of 3 positions shown · non-contrast
Comparison: None

CLINICAL DATA: Bilateral foot pain

RIGHT FOOT COMPLETE - 3+ VIEW

[t foot ap right]
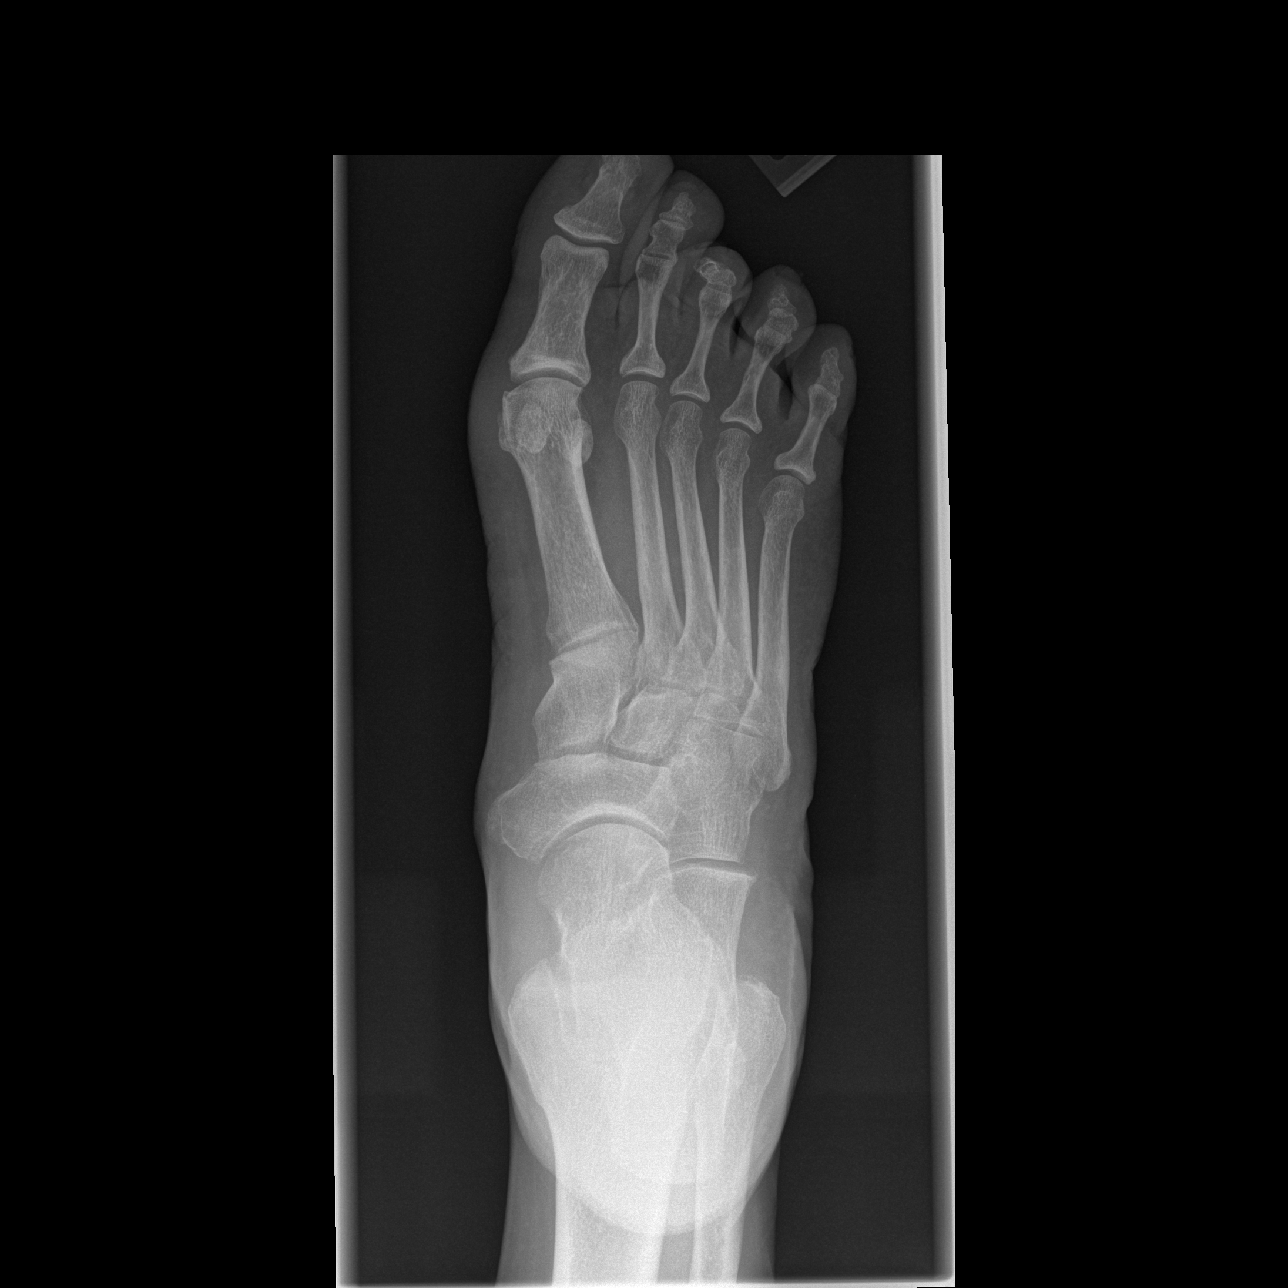

[t foot oblique right]
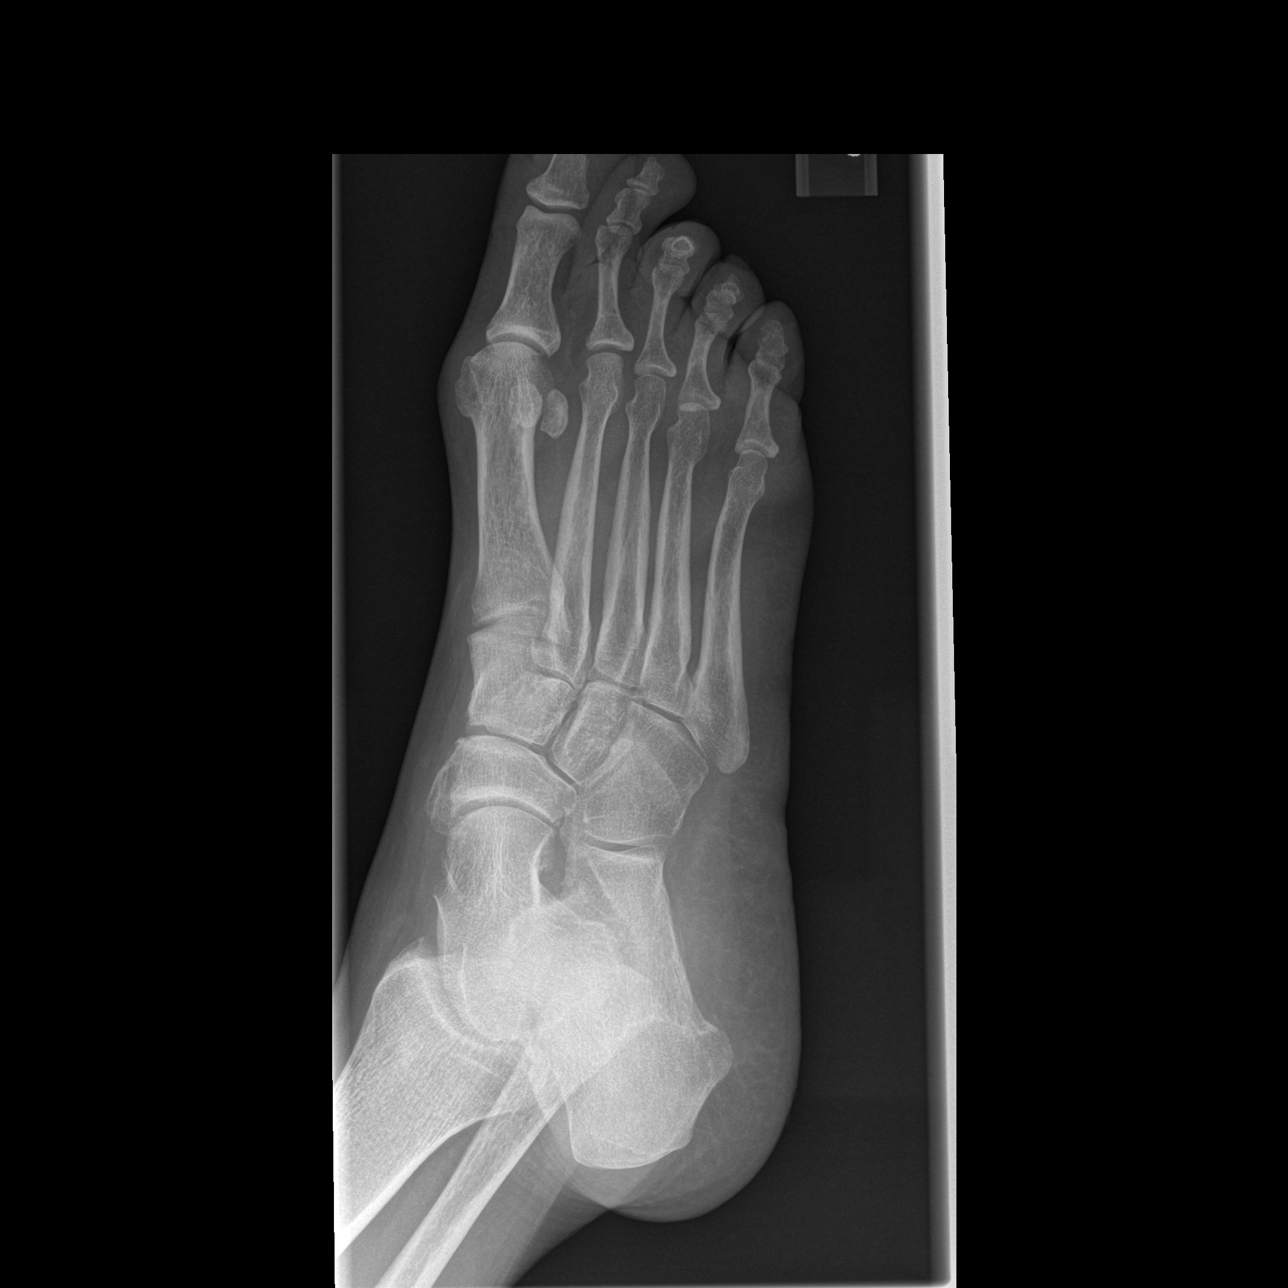

[t foot lat right]
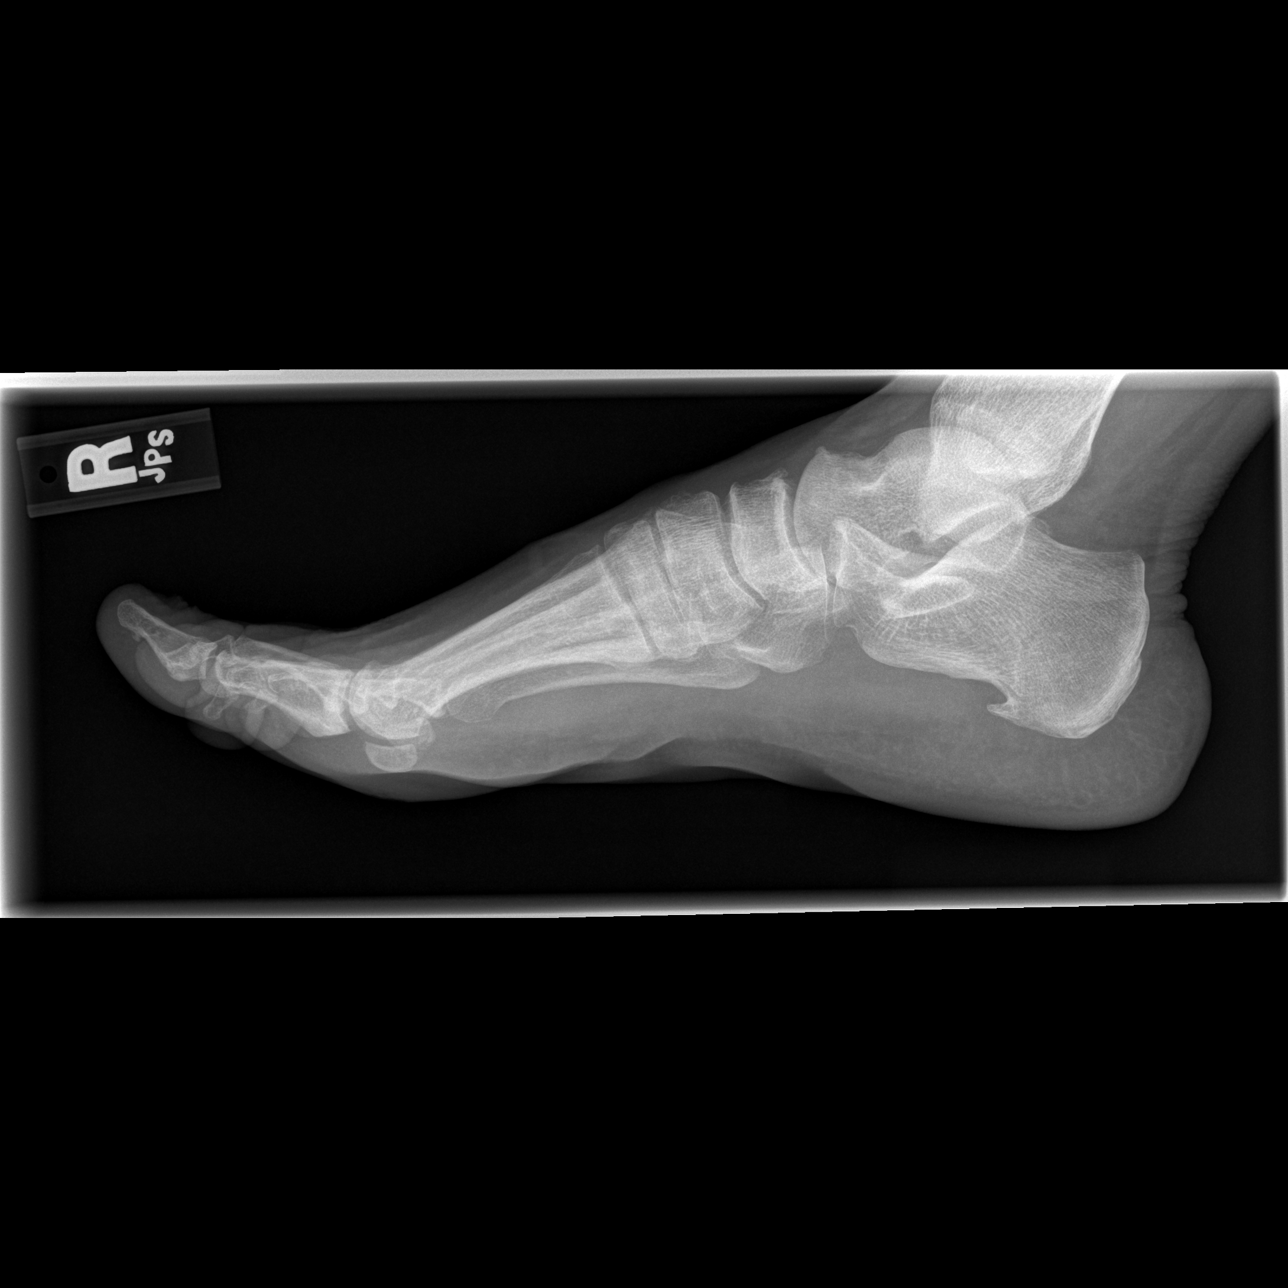

[3 of 3 positions shown; findings below may reference images not displayed]

FINDINGS: No fracture or dislocation.  There is mild to moderate
degenerative change of the first MTP joint.  A calcaneal spur is
noted.
IMPRESSION: 1.  Degenerative changes of the first MTP joint and calcaneal spur.
2.  No acute findings.

## 2009-08-26 IMAGING — CR DG FOOT COMPLETE 3+V*L*
3 series · 3 of 3 positions shown · non-contrast
Comparison: None

CLINICAL DATA: Bilateral foot pain

LEFT FOOT - COMPLETE 3+ VIEW

[t foot ap left]
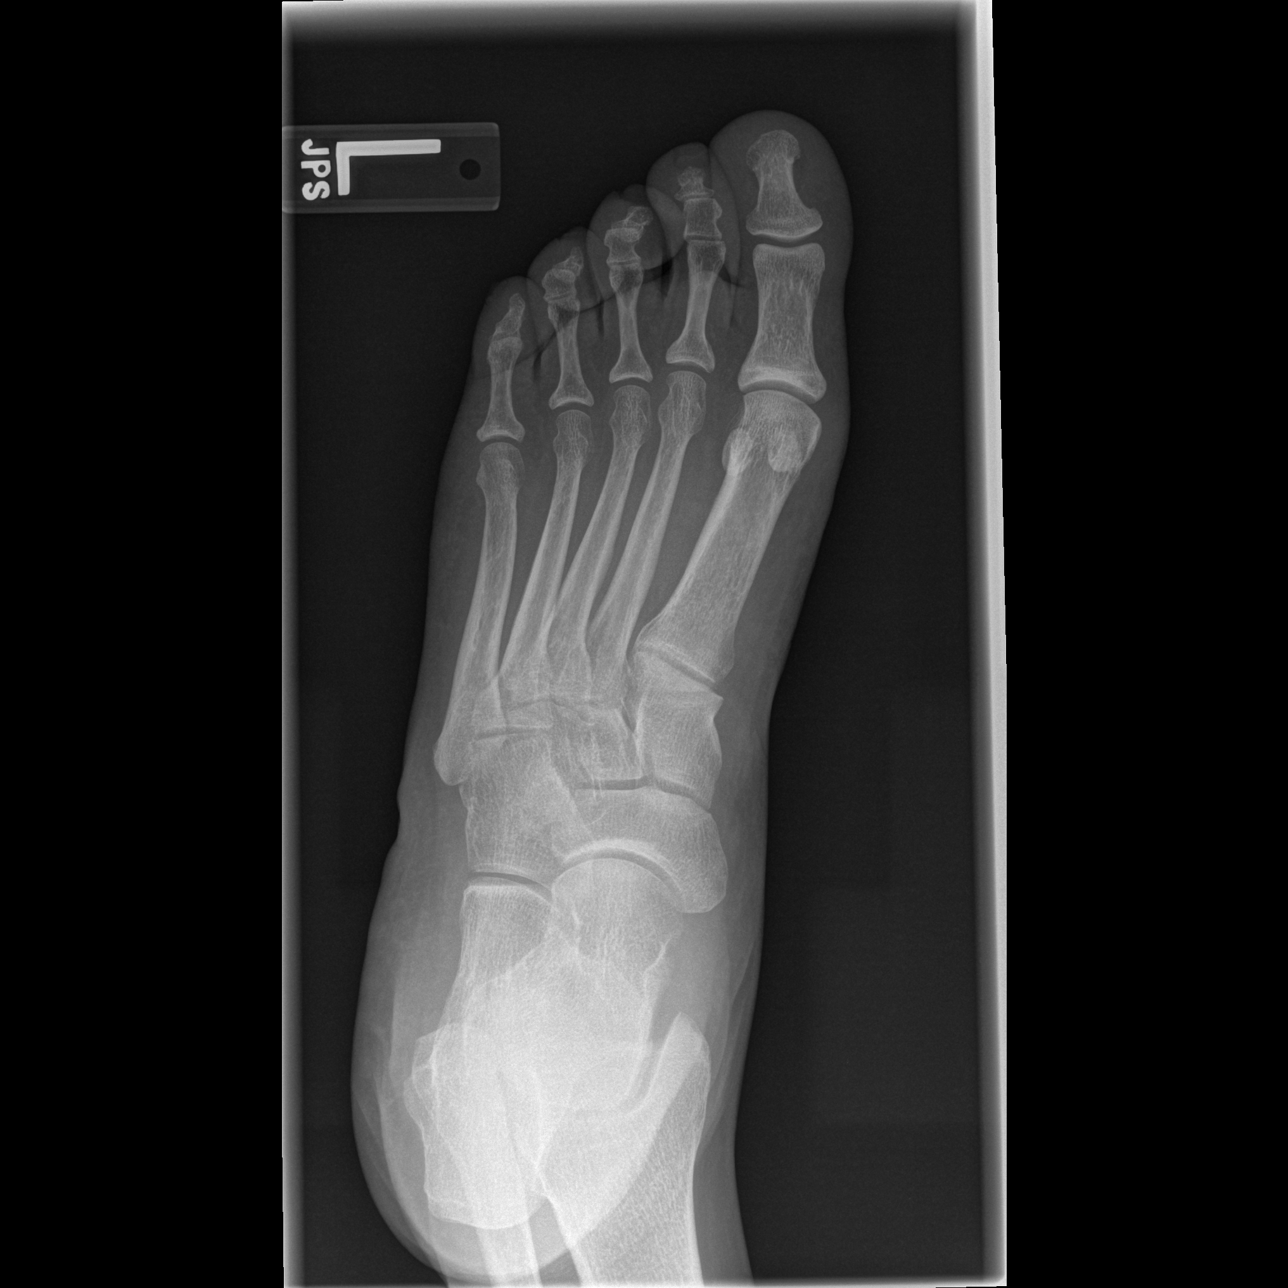

[t foot oblique left]
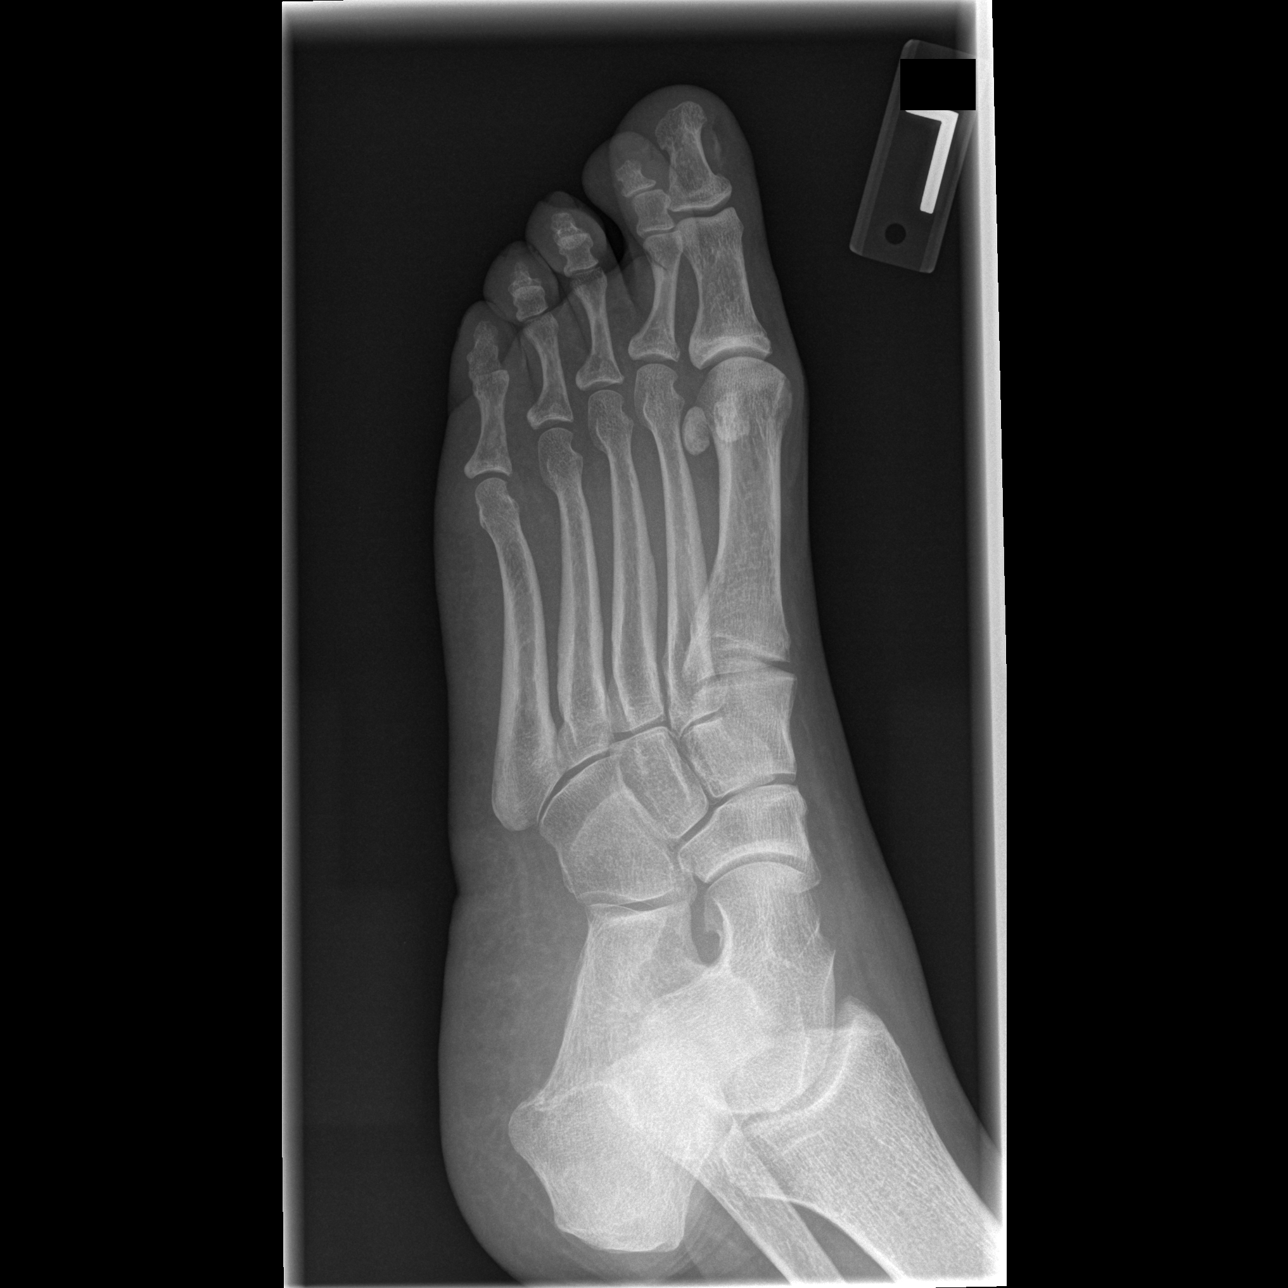

[t foot lat left]
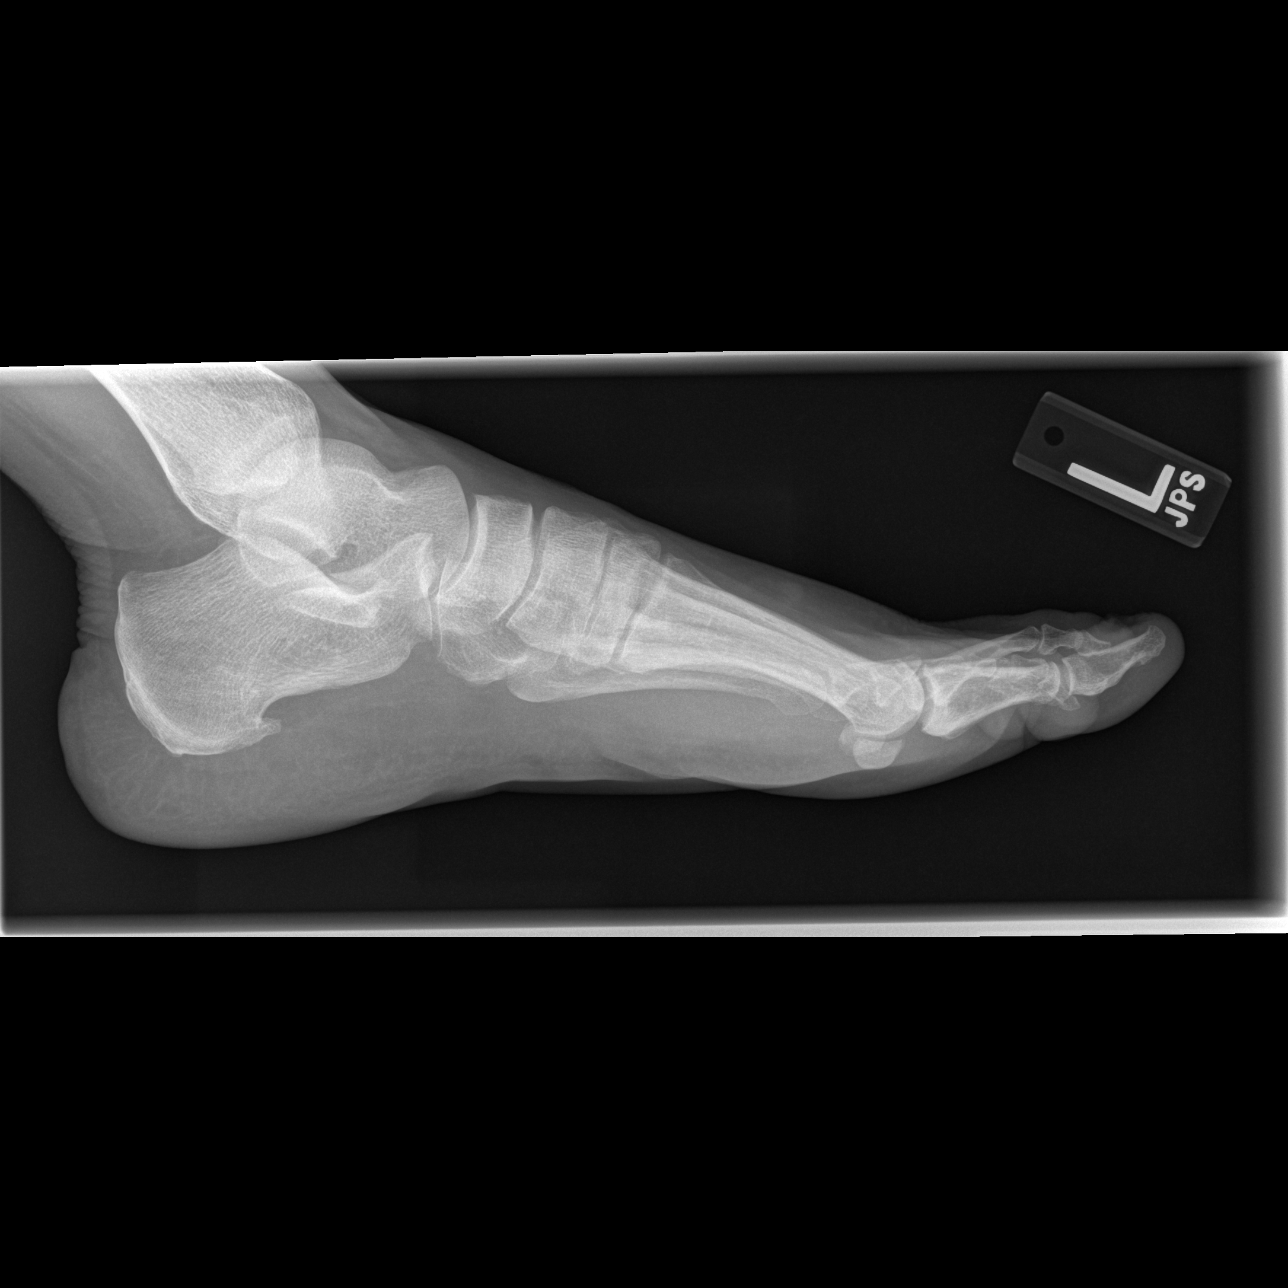

[3 of 3 positions shown; findings below may reference images not displayed]

FINDINGS: No fracture, dislocation, or obvious bony deformities.
There is a prominent calcaneal spur.  No significant arthropathy.
IMPRESSION: No acute or significant findings.  A calcaneal spur is noted.

## 2009-10-26 ENCOUNTER — Ambulatory Visit: Payer: Self-pay | Admitting: Family Medicine

## 2009-10-26 LAB — CONVERTED CEMR LAB
ALT: 14 units/L (ref 0–35)
AST: 15 units/L (ref 0–37)
Albumin: 4.3 g/dL (ref 3.5–5.2)
Alkaline Phosphatase: 69 units/L (ref 39–117)
BUN: 19 mg/dL (ref 6–23)
CO2: 23 meq/L (ref 19–32)
Calcium: 9.6 mg/dL (ref 8.4–10.5)
Chloride: 103 meq/L (ref 96–112)
Creatinine, Ser: 1.21 mg/dL — ABNORMAL HIGH (ref 0.40–1.20)
Glucose, Bld: 103 mg/dL — ABNORMAL HIGH (ref 70–99)
Potassium: 3.9 meq/L (ref 3.5–5.3)
Sodium: 139 meq/L (ref 135–145)
Total Bilirubin: 1.3 mg/dL — ABNORMAL HIGH (ref 0.3–1.2)
Total Protein: 7.3 g/dL (ref 6.0–8.3)
Vit D, 25-Hydroxy: 44 ng/mL (ref 30–89)

## 2009-11-24 ENCOUNTER — Ambulatory Visit: Payer: Self-pay | Admitting: Family Medicine

## 2010-01-26 ENCOUNTER — Ambulatory Visit: Payer: Self-pay | Admitting: Internal Medicine

## 2010-04-04 ENCOUNTER — Ambulatory Visit (HOSPITAL_COMMUNITY): Admission: RE | Admit: 2010-04-04 | Discharge: 2010-04-04 | Payer: Self-pay | Admitting: Family Medicine

## 2010-04-04 IMAGING — CR DG LUMBAR SPINE COMPLETE 4+V
5 series · 5 of 5 positions shown · non-contrast
Comparison: [DATE]

CLINICAL DATA: Low back pain, hip pain

LUMBAR SPINE - COMPLETE 4+ VIEW

[t l-spine a.p.]
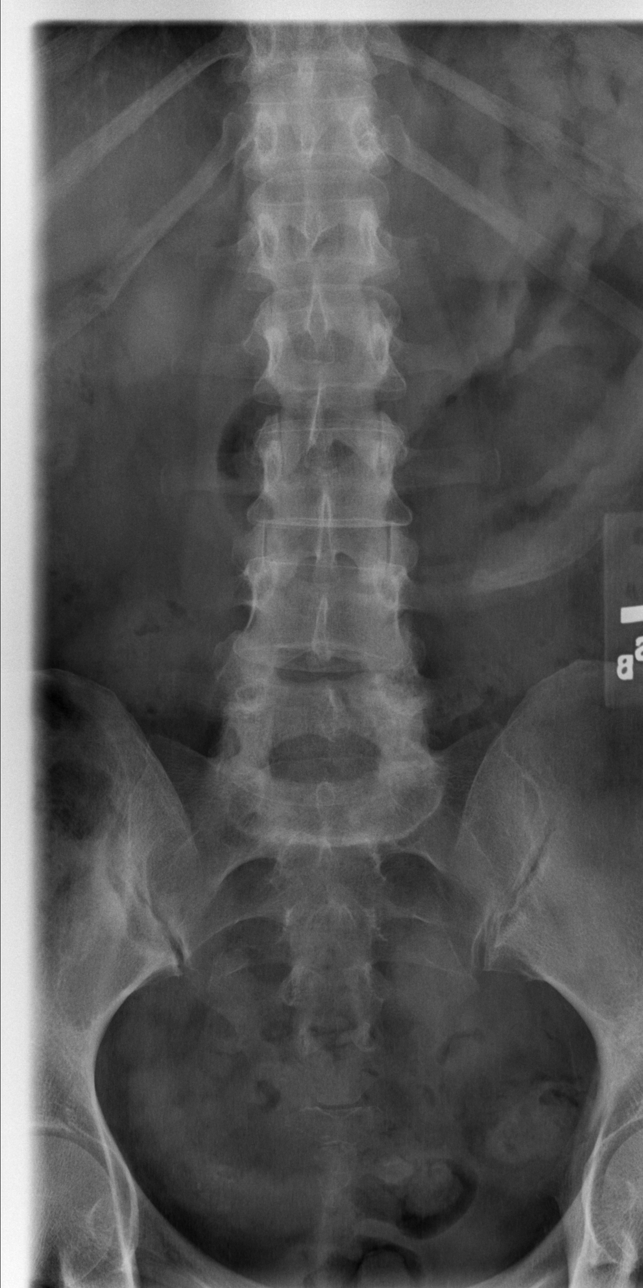

[t l-spine oblique exposure (1 of 2)]
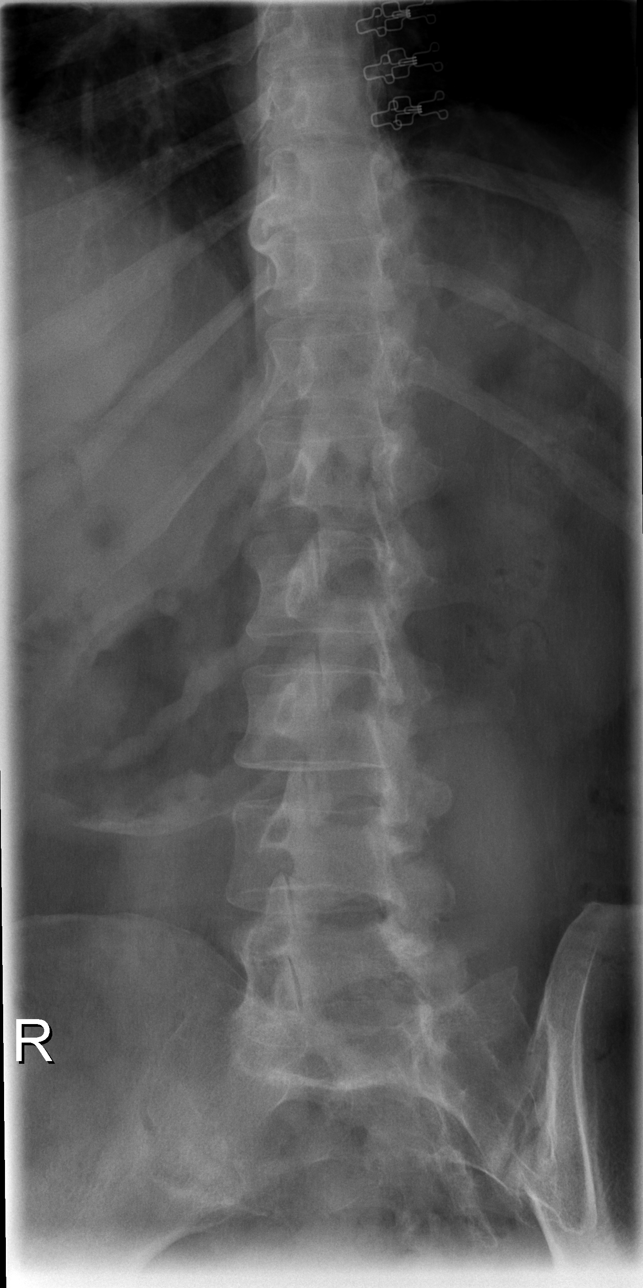

[t l-spine oblique exposure (2 of 2)]
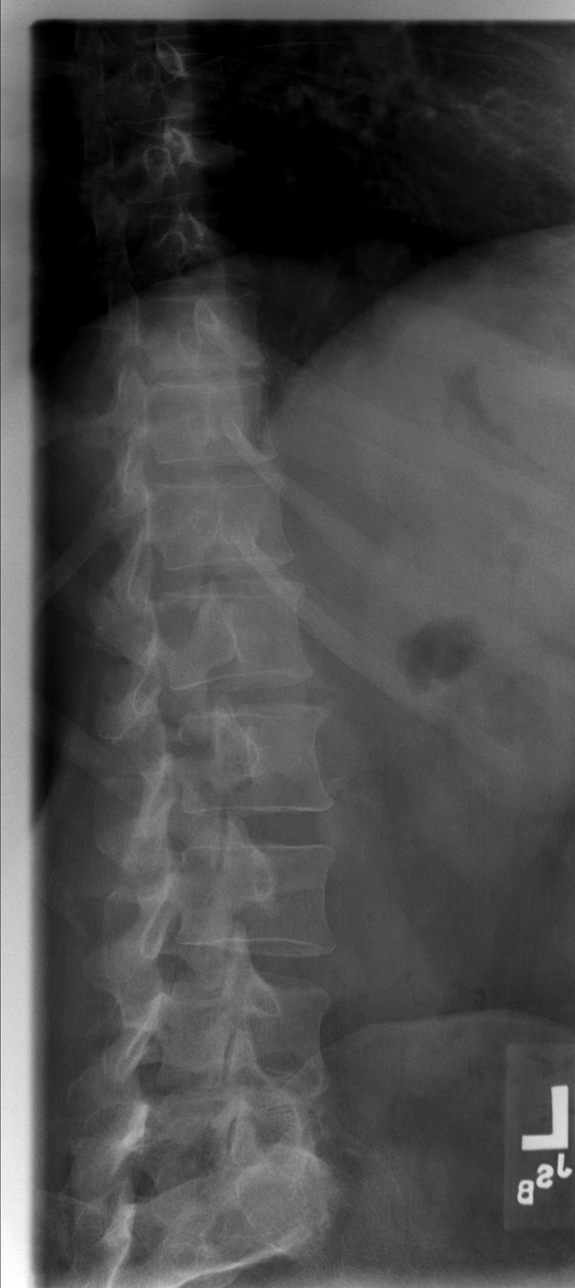

[t l-spine lat]
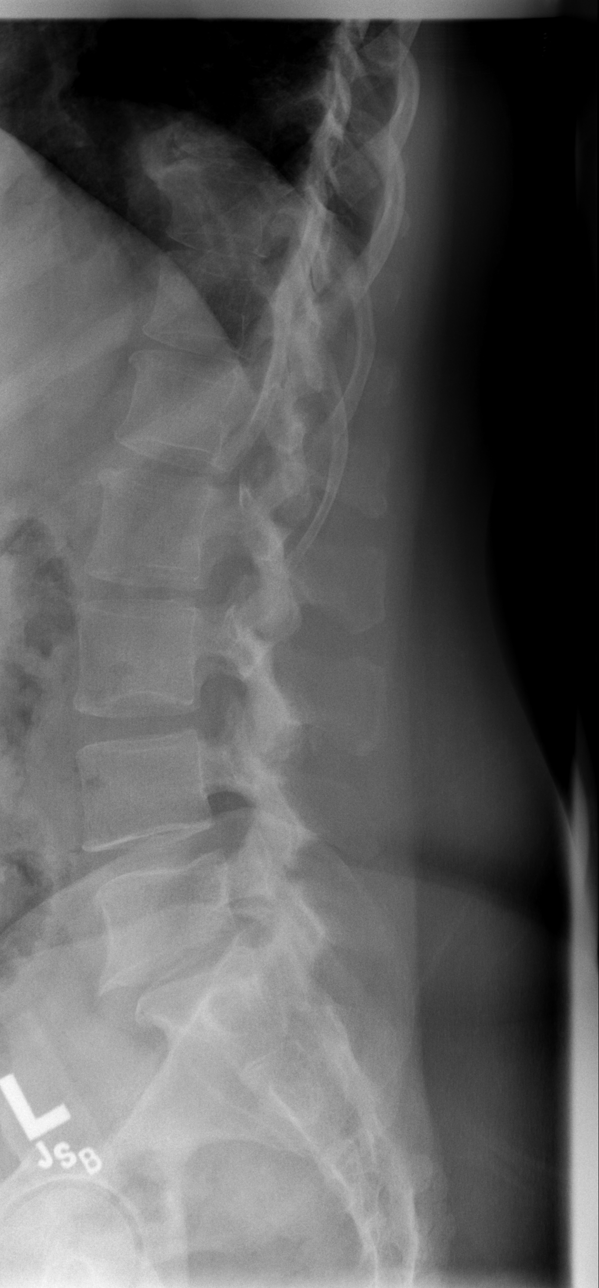

[t l-spine l5-s1 spot]
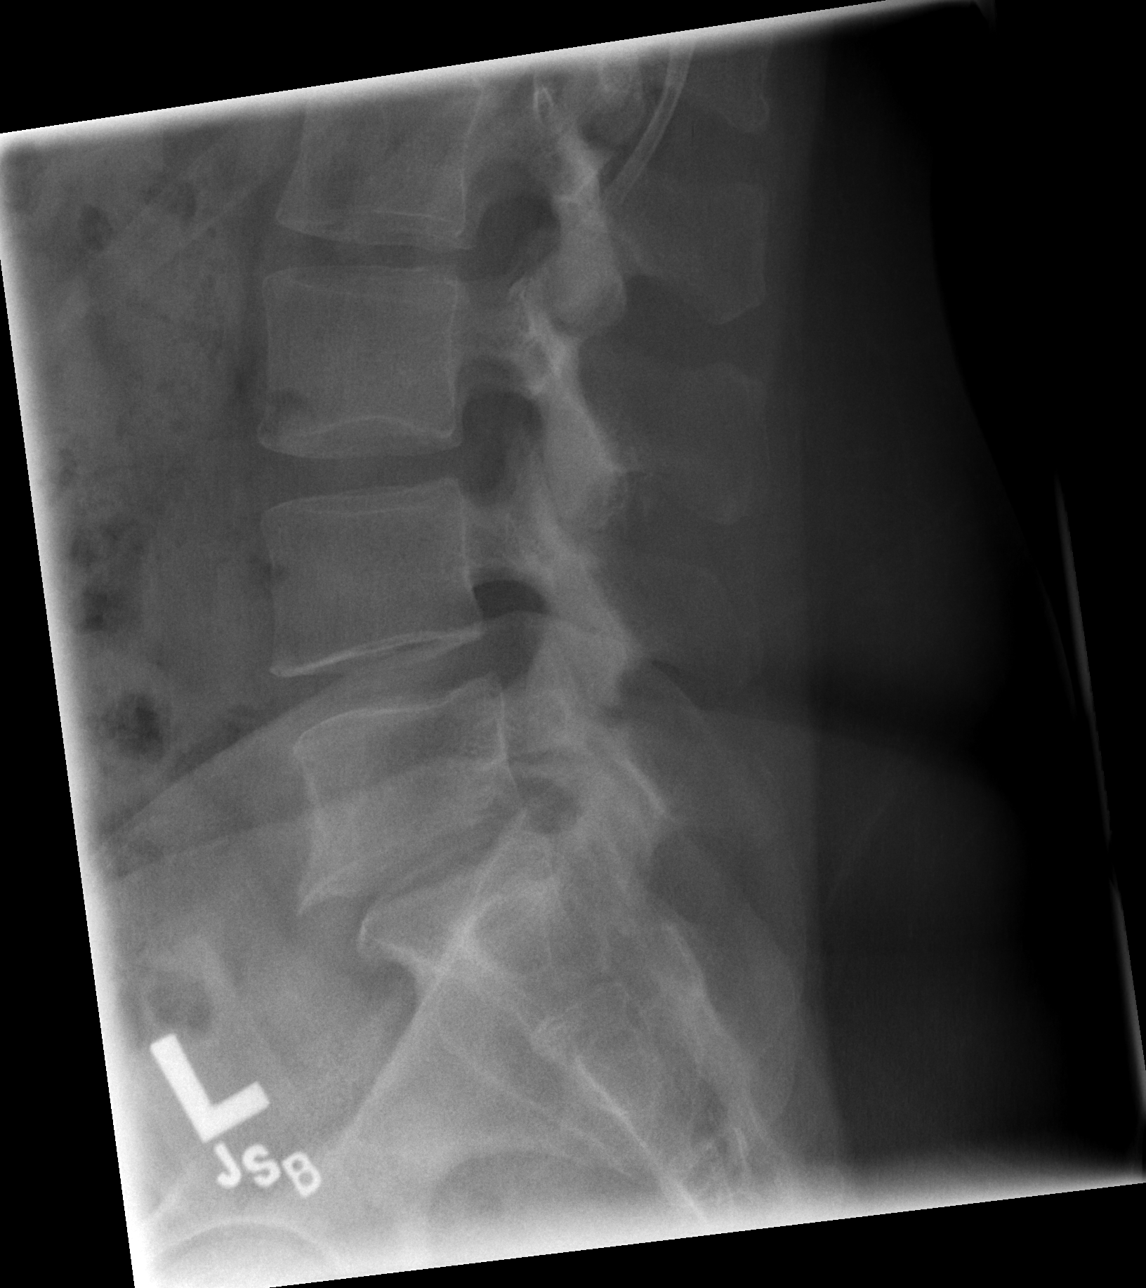

[5 of 5 positions shown; findings below may reference images not displayed]

FINDINGS: Normal lumbar spine alignment.  Degenerative changes of
the lower thoracic spine and L5 S1 level.  Negative for compression
fracture, wedge shaped deformity.  No pars defects.  Intact
pedicles.  Minimal sclerosis of the right SI joint consistent with
mild arthropathy.
IMPRESSION: Lower thoracic and L5 S1 degenerative changes.
Right SI joint mild sclerosis.

## 2010-04-04 IMAGING — CR DG SI JOINTS 3+V
3 series · 3 of 3 positions shown · non-contrast
Comparison: [DATE]

CLINICAL DATA: Low back pain, left hip pain

BILATERAL SACROILIAC JOINTS - 3+ VIEW

[t sacroiliac joints (1 of 3)]
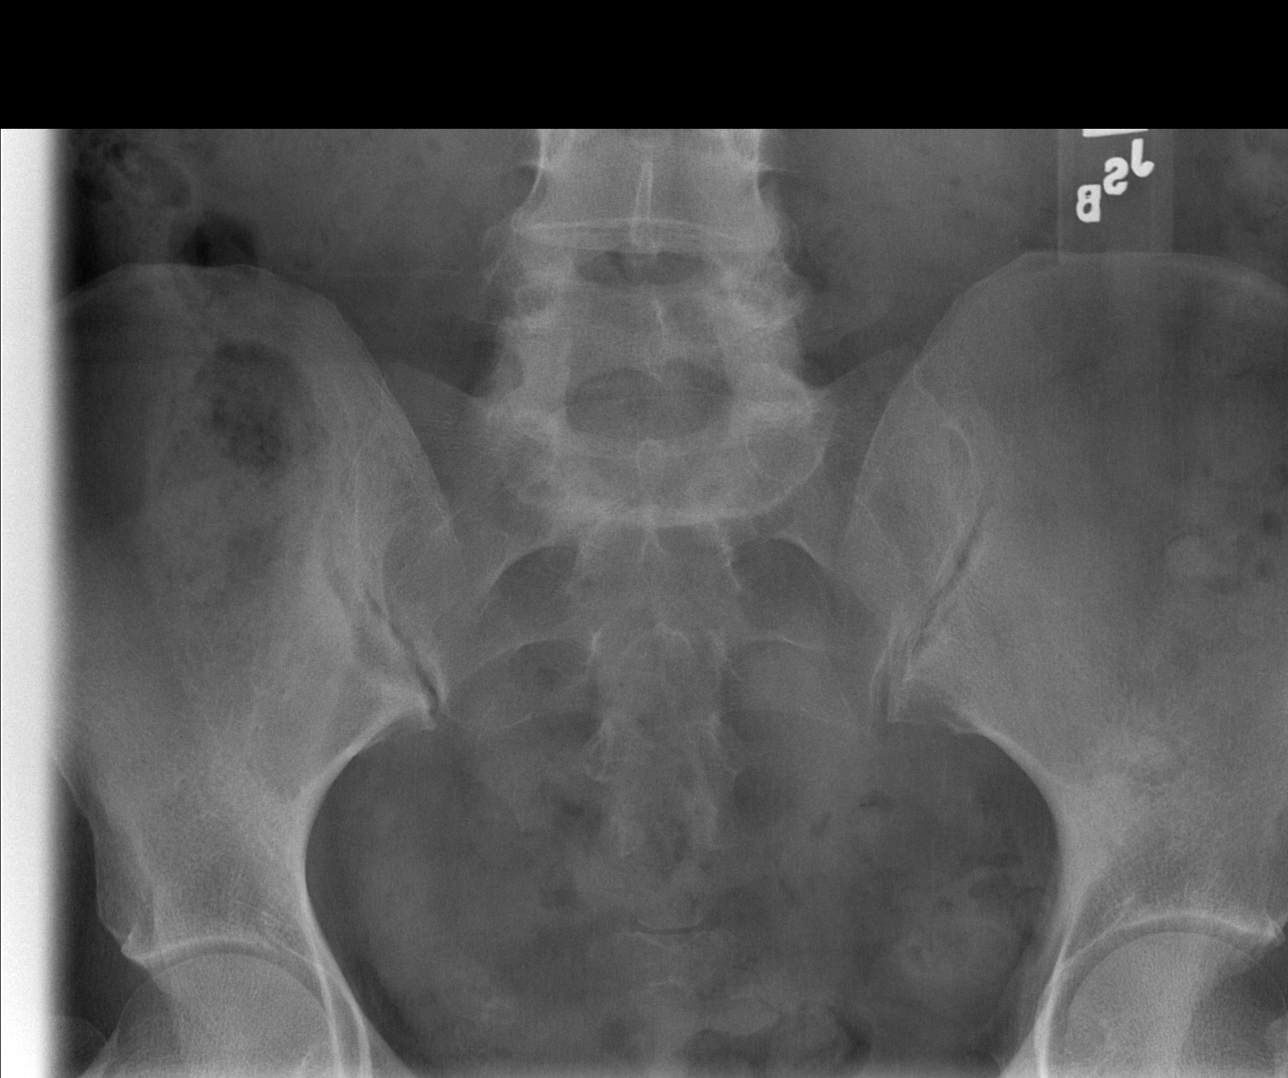

[t sacroiliac joints (2 of 3)]
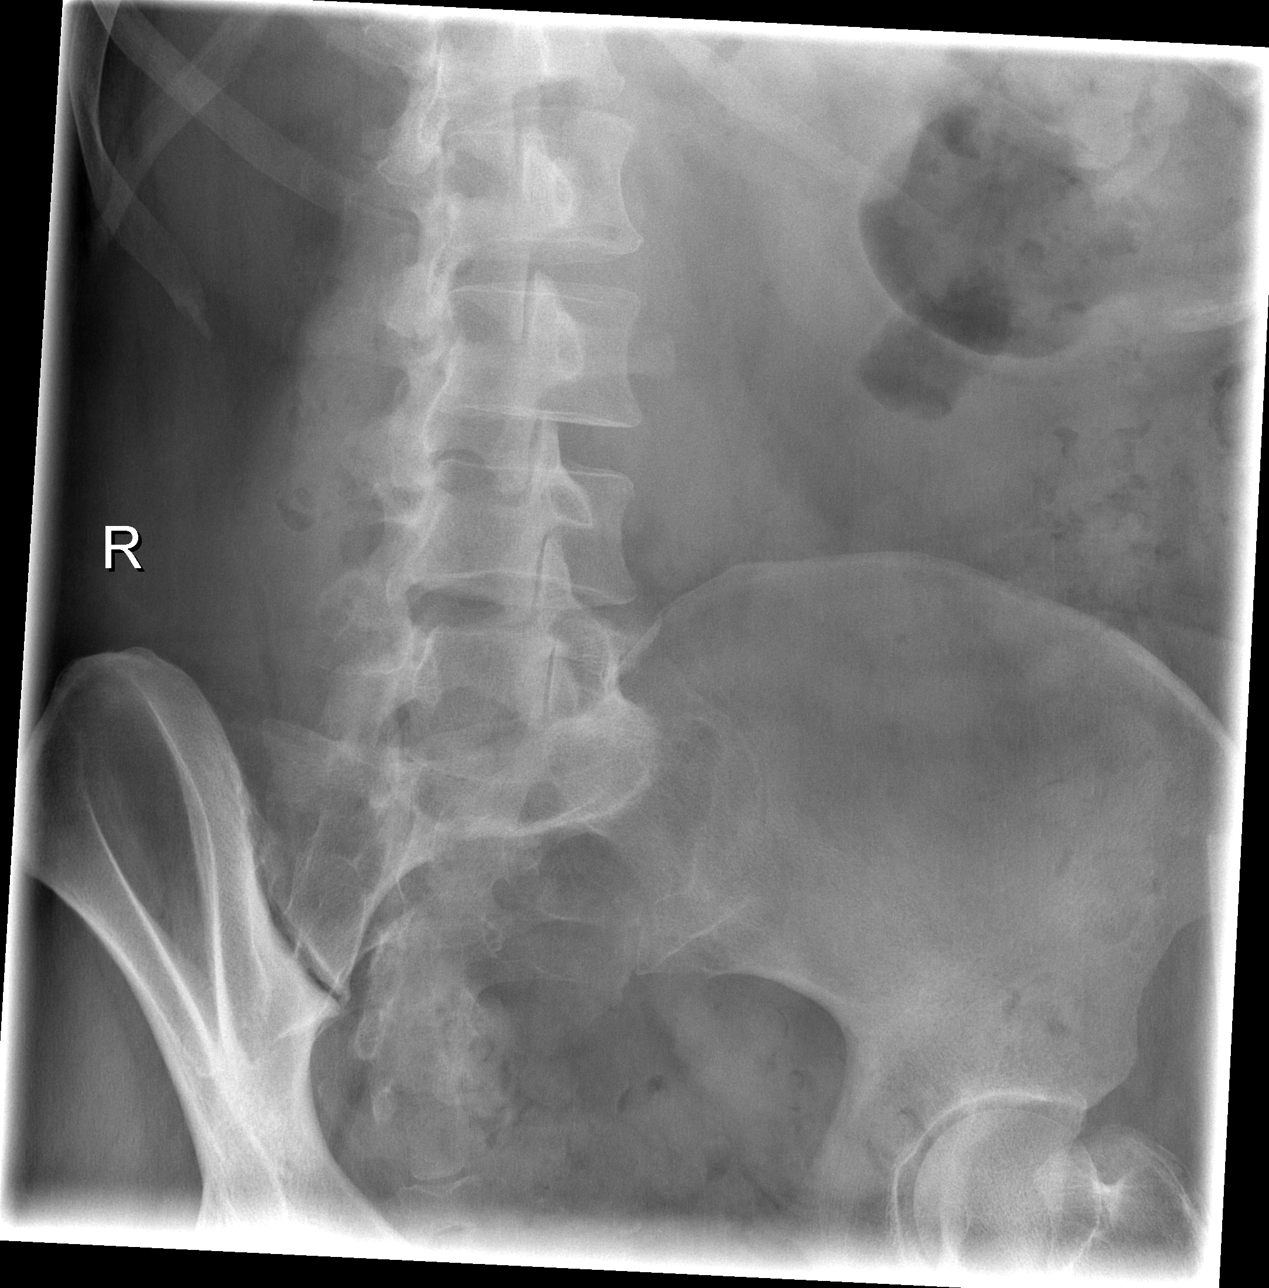

[t sacroiliac joints (3 of 3)]
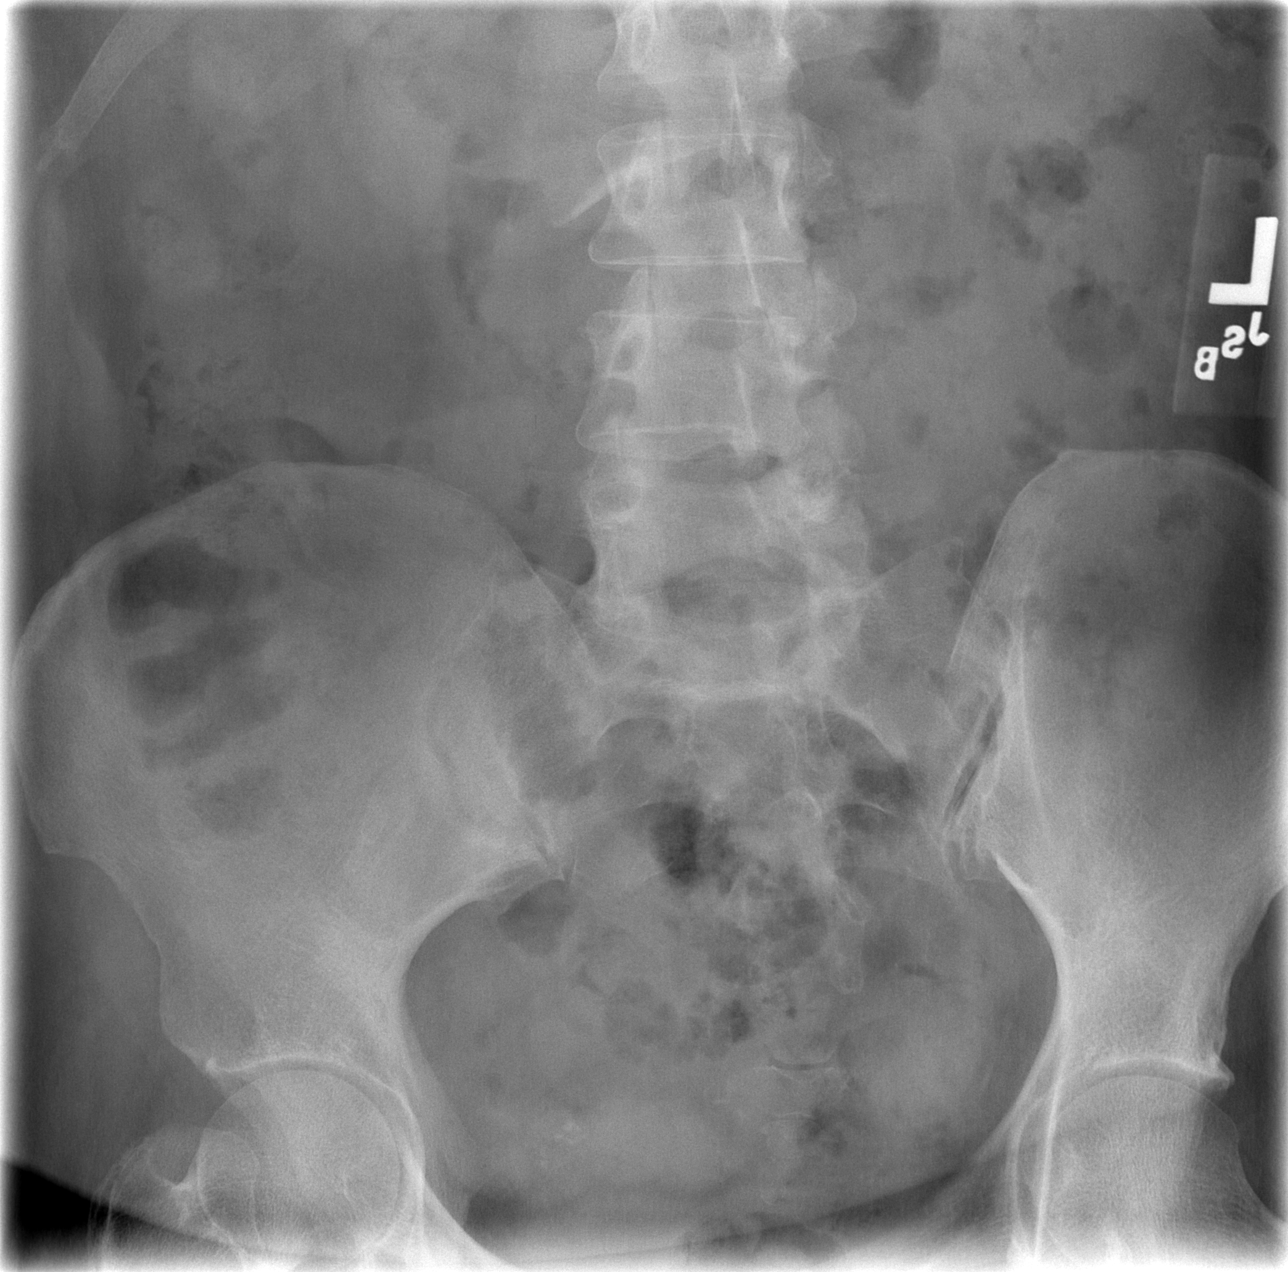

[3 of 3 positions shown; findings below may reference images not displayed]

FINDINGS: Slight sclerosis of the right SI joint consistent with
mild arthropathy.  Both SI joints remain patent.  No fusion or
ankylosis.  Left SI joint unremarkable.  Sacrum intact.
IMPRESSION: Mild right SI joint arthropathy

## 2010-05-26 ENCOUNTER — Encounter
Admission: RE | Admit: 2010-05-26 | Discharge: 2010-06-12 | Payer: Self-pay | Source: Home / Self Care | Attending: Family Medicine | Admitting: Family Medicine

## 2010-08-03 ENCOUNTER — Encounter (INDEPENDENT_AMBULATORY_CARE_PROVIDER_SITE_OTHER): Payer: Self-pay | Admitting: Family Medicine

## 2010-08-03 LAB — CONVERTED CEMR LAB: Microalb, Ur: 4.87 mg/dL — ABNORMAL HIGH (ref 0.00–1.89)

## 2010-10-07 ENCOUNTER — Other Ambulatory Visit (HOSPITAL_COMMUNITY): Payer: Self-pay | Admitting: Family Medicine

## 2010-10-07 DIAGNOSIS — G459 Transient cerebral ischemic attack, unspecified: Secondary | ICD-10-CM

## 2010-10-07 DIAGNOSIS — R569 Unspecified convulsions: Secondary | ICD-10-CM

## 2010-10-08 ENCOUNTER — Ambulatory Visit (HOSPITAL_COMMUNITY)
Admission: RE | Admit: 2010-10-08 | Discharge: 2010-10-08 | Disposition: A | Discharge status: Home / Self Care | Payer: Self-pay | Source: Ambulatory Visit | Attending: Family Medicine | Admitting: Family Medicine

## 2010-10-08 DIAGNOSIS — R569 Unspecified convulsions: Secondary | ICD-10-CM | POA: Insufficient documentation

## 2010-10-08 DIAGNOSIS — Z8673 Personal history of transient ischemic attack (TIA), and cerebral infarction without residual deficits: Secondary | ICD-10-CM | POA: Insufficient documentation

## 2010-10-08 DIAGNOSIS — G459 Transient cerebral ischemic attack, unspecified: Secondary | ICD-10-CM

## 2010-10-08 DIAGNOSIS — I672 Cerebral atherosclerosis: Secondary | ICD-10-CM | POA: Insufficient documentation

## 2010-10-08 IMAGING — CT CT HEAD W/O CM
2 series · 16 of 30 positions shown, 20 images · non-contrast
Comparison: [DATE] MR by report only

CLINICAL DATA: TIA, seizures

CT HEAD WITHOUT CONTRAST
TECHNIQUE: Contiguous axial images were obtained from the base of
the skull through the vertex without contrast.

[Series 2: head w/o · axial · non-contrast · 0.49mm/px · z∈[+795,+915]mm · 13 of 30 slices shown, 17 images]
[im 3/30  brain]
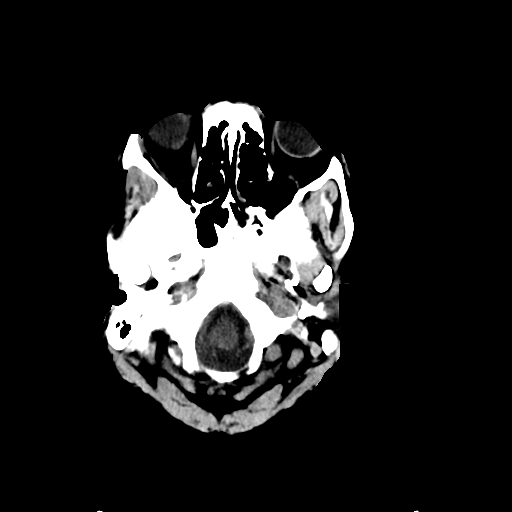
[im 3/30  bone]
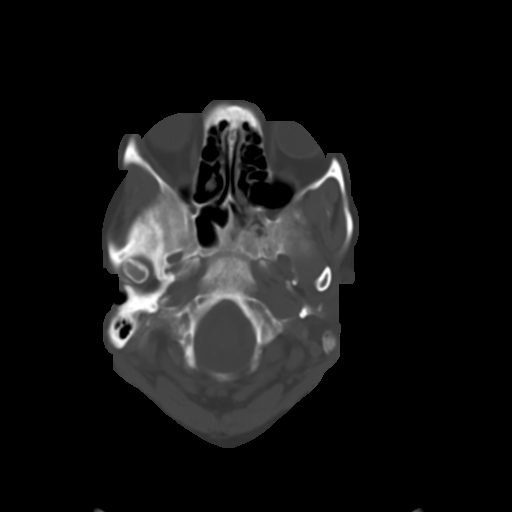
[im 5/30  brain]
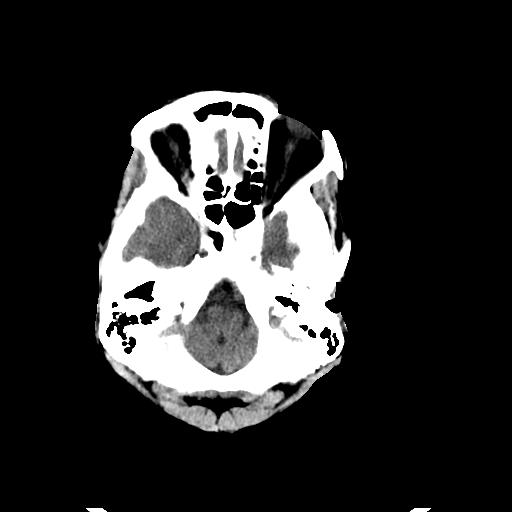
[im 7/30  brain]
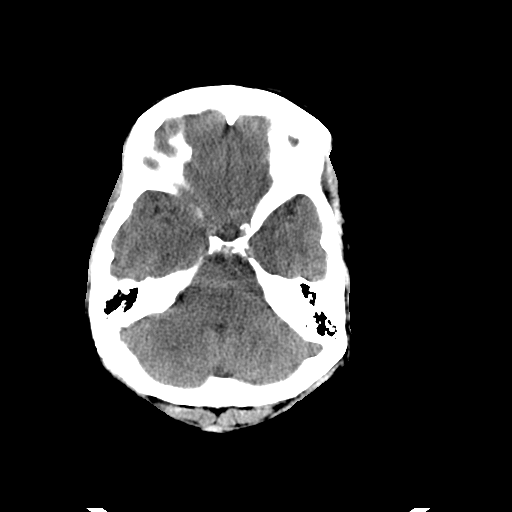
[im 9/30  brain]
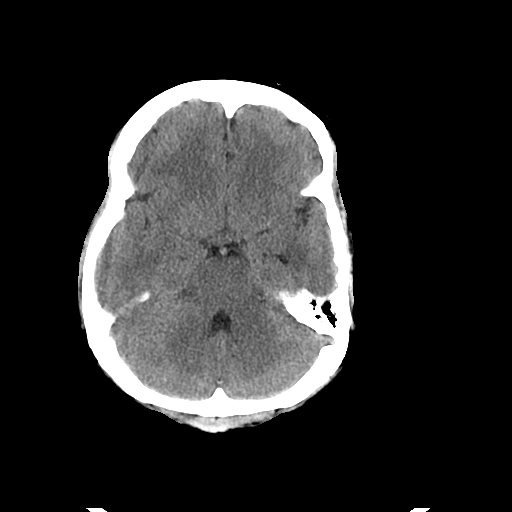
[im 11/30  brain]
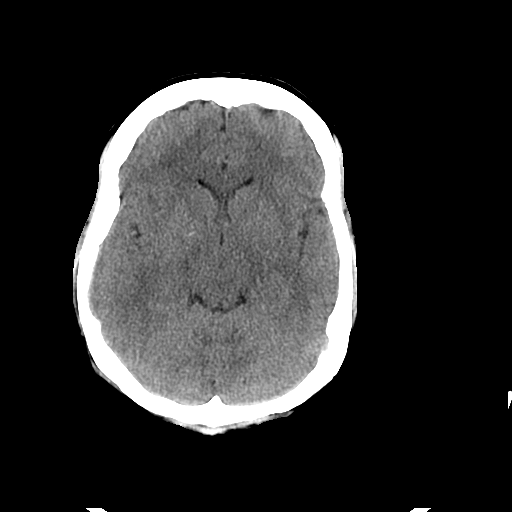
[im 11/30  bone]
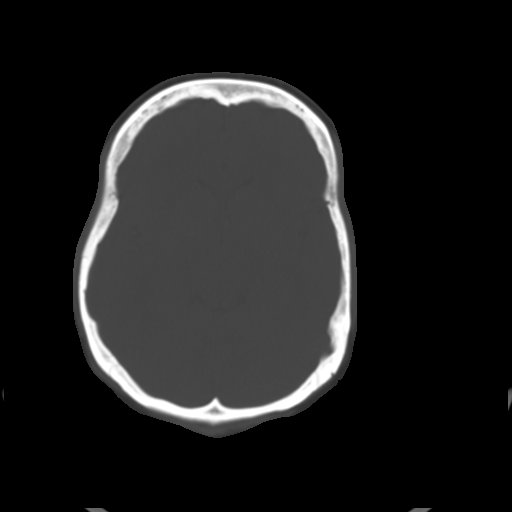
[im 13/30  brain]
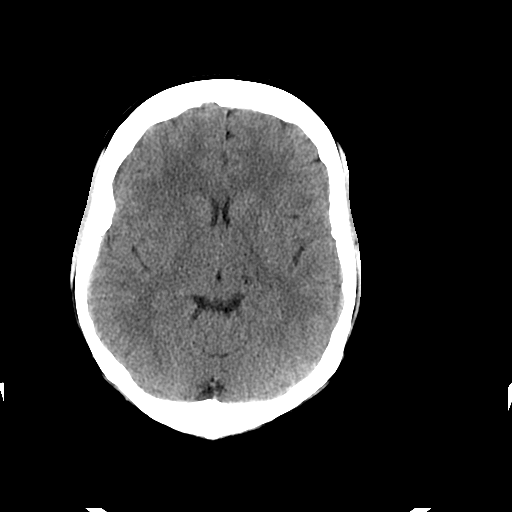
[im 15/30  brain]
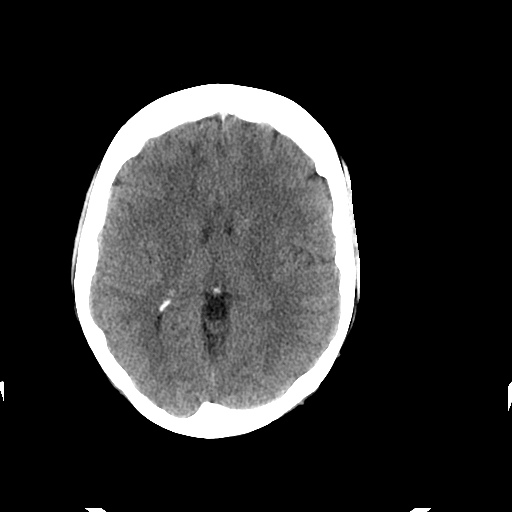
[im 17/30  brain]
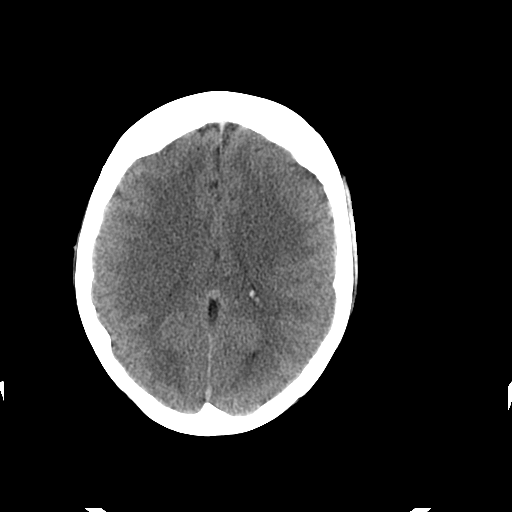
[im 19/30  brain]
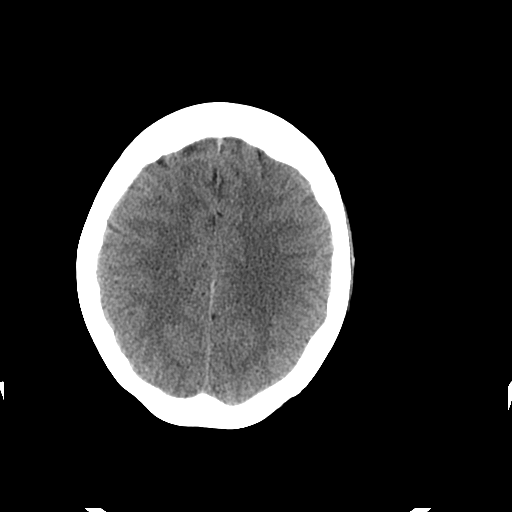
[im 19/30  bone]
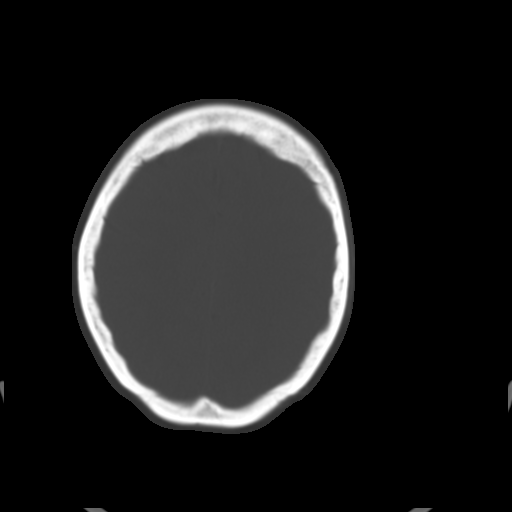
[im 21/30  brain]
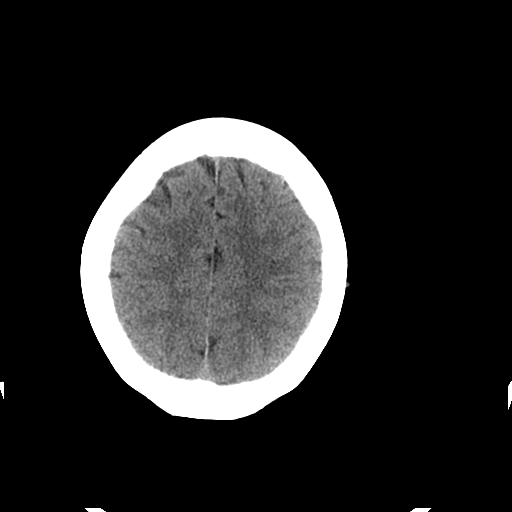
[im 23/30  brain]
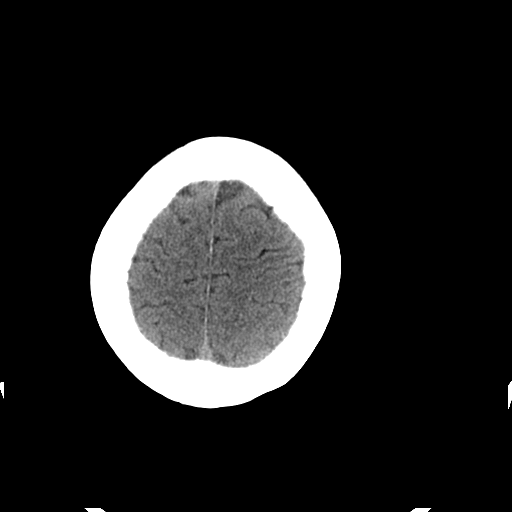
[im 25/30  brain]
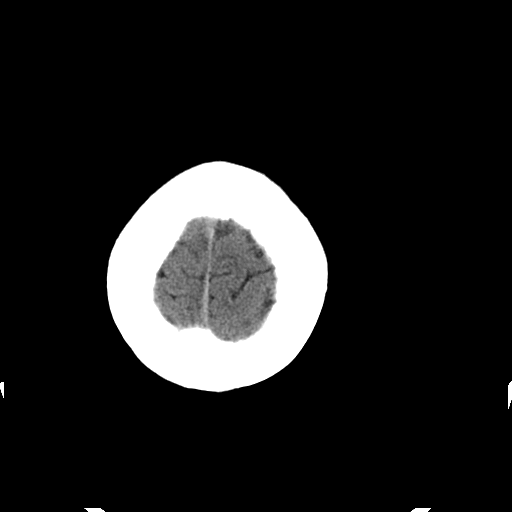
[im 27/30  brain]
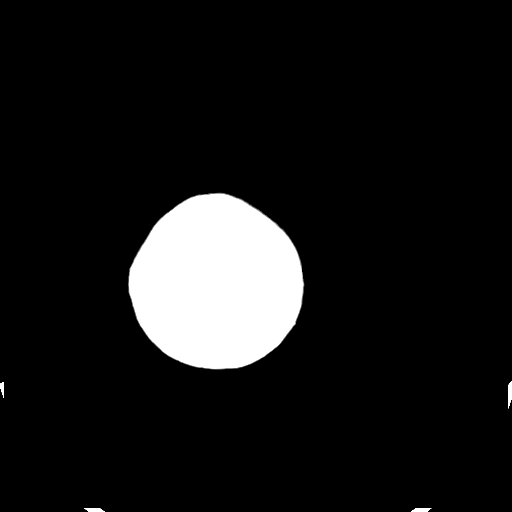
[im 27/30  bone]
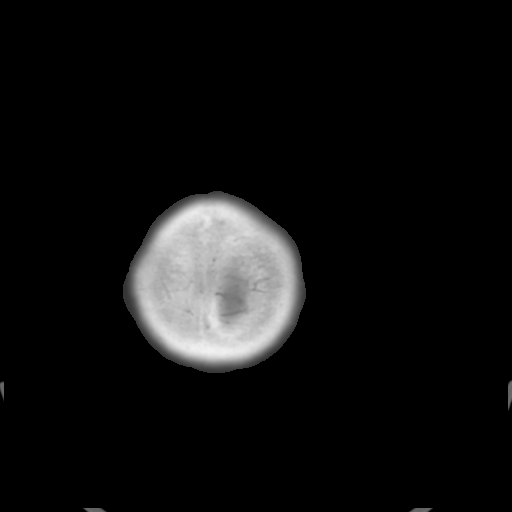

[Series 3: head w/o bone · axial · non-contrast · 0.49mm/px · z∈[+795,+835]mm · 3 of 30 slices shown]
[im 3/30  bone]
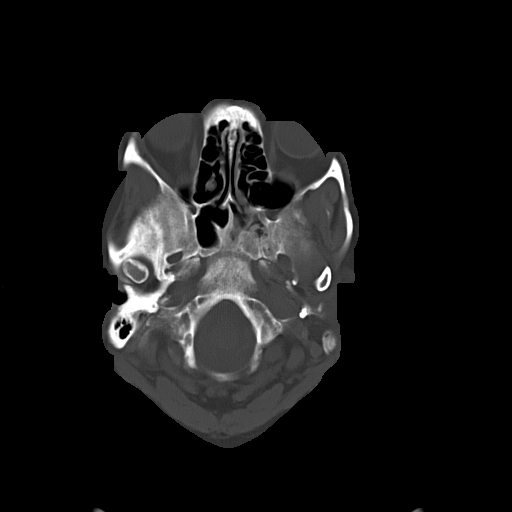
[im 7/30  bone]
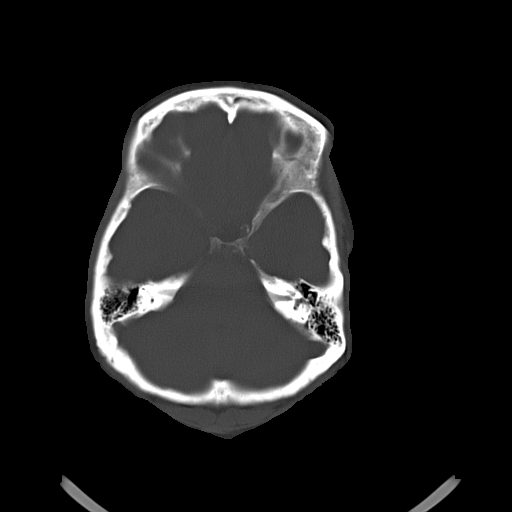
[im 11/30  bone]
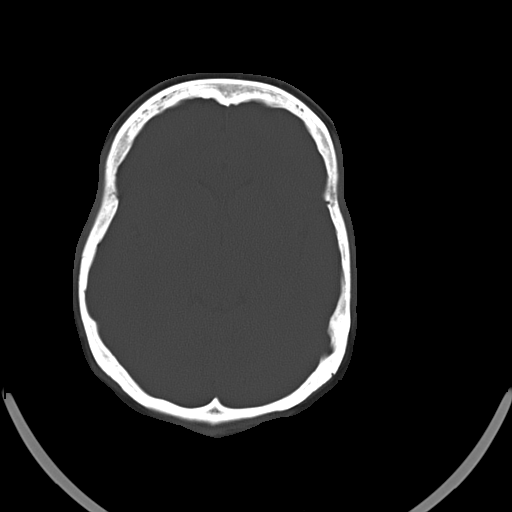

[16 of 30 positions shown; findings below may reference images not displayed]

FINDINGS: Atherosclerotic and physiologic intracranial
calcifications.  There is a subtle nonspecific low attenuation
focus in the deep right parietal white matter image [DATE]. There is
no evidence of acute intracranial hemorrhage,   mass lesion, acute
infarction,   mass effect, or midline shift. Acute infarct may be
inapparent on noncontrast CT.  No other intra-axial abnormalities
are seen, and the ventricles and sulci are within normal limits in
size and symmetry.   No abnormal extra-axial fluid collections or
masses are identified.  No significant calvarial abnormality.
IMPRESSION: 1. Negative for bleed or other acute intracranial process.
2.  Nonspecific hypoattenuation in the right parietal white matter.

## 2011-03-11 LAB — DIFFERENTIAL
Basophils Absolute: 0
Basophils Relative: 0
Eosinophils Absolute: 0.1
Eosinophils Relative: 1
Lymphocytes Relative: 28
Lymphs Abs: 1.4
Monocytes Absolute: 0.3
Monocytes Relative: 7
Neutro Abs: 3.1
Neutrophils Relative %: 64

## 2011-03-11 LAB — BASIC METABOLIC PANEL
BUN: 15
CO2: 27
Calcium: 9.7
Chloride: 98
Creatinine, Ser: 1.09
GFR calc Af Amer: >60
GFR calc non Af Amer: 53 — ABNORMAL LOW
Glucose, Bld: 449 — ABNORMAL HIGH
Potassium: 3.5
Sodium: 134 — ABNORMAL LOW

## 2011-03-11 LAB — CBC
HCT: 42
Hemoglobin: 14.5
MCHC: 34.5
MCV: 98.2
Platelets: 200
RBC: 4.27
RDW: 14.3
WBC: 4.9

## 2011-03-15 ENCOUNTER — Ambulatory Visit: Payer: No Typology Code available for payment source | Attending: Family Medicine

## 2011-09-08 ENCOUNTER — Other Ambulatory Visit (HOSPITAL_COMMUNITY): Payer: Self-pay | Admitting: Internal Medicine

## 2011-09-08 DIAGNOSIS — Z1231 Encounter for screening mammogram for malignant neoplasm of breast: Secondary | ICD-10-CM

## 2011-09-16 ENCOUNTER — Ambulatory Visit (HOSPITAL_COMMUNITY)
Admission: RE | Admit: 2011-09-16 | Discharge: 2011-09-16 | Disposition: A | Discharge status: Home / Self Care | Payer: Self-pay | Source: Ambulatory Visit | Attending: Internal Medicine | Admitting: Internal Medicine

## 2011-09-16 DIAGNOSIS — Z1231 Encounter for screening mammogram for malignant neoplasm of breast: Secondary | ICD-10-CM | POA: Insufficient documentation

## 2011-09-19 ENCOUNTER — Other Ambulatory Visit: Payer: Self-pay | Admitting: Internal Medicine

## 2011-09-19 DIAGNOSIS — R928 Other abnormal and inconclusive findings on diagnostic imaging of breast: Secondary | ICD-10-CM

## 2011-10-11 ENCOUNTER — Other Ambulatory Visit: Payer: Self-pay | Admitting: Obstetrics and Gynecology

## 2011-10-11 ENCOUNTER — Ambulatory Visit (INDEPENDENT_AMBULATORY_CARE_PROVIDER_SITE_OTHER): Payer: Self-pay | Admitting: *Deleted

## 2011-10-11 ENCOUNTER — Encounter: Payer: Self-pay | Admitting: *Deleted

## 2011-10-11 VITALS — BP 153/96 | HR 88 | Temp 98.7°F | Ht 63.0 in | Wt 173.4 lb

## 2011-10-11 DIAGNOSIS — R928 Other abnormal and inconclusive findings on diagnostic imaging of breast: Secondary | ICD-10-CM

## 2011-10-11 DIAGNOSIS — Z01419 Encounter for gynecological examination (general) (routine) without abnormal findings: Secondary | ICD-10-CM

## 2011-10-11 NOTE — Progress Notes (Signed)
Patient referred from the [facility] due to needing additional imaging of the left breast. Screening mammogram was completed 09/16/11.  Pap Smear:    Pap smear completed today. Patients last Pap smear was 09/26/08 and was normal. Per patient no history of abnormal Pap smears. Pap smear results above is in EPIC.  Physical exam: Breasts Breasts symmetrical. No skin abnormalities bilateral breasts. No nipple retraction bilateral breasts. No nipple discharge bilateral breasts. No lymphadenopathy. No lumps palpated bilateral breasts. No complaints of pain or tenderness on exam. Patient referred to the [facility] for a left breast diagnostic mammogram per recommendation from the [facility]. Appointment scheduled for Friday, Oct 14, 2011 at 1000.         Pelvic/Bimanual   Ext Genitalia No lesions, no swelling and no discharge observed on external genitalia.         Vagina Vagina pink and normal texture. No lesions or discharge observed in vagina.          Cervix Cervix is present. Cervix pink and of normal texture. Cervix friable. No discharge observed at cervical os.     Uterus Uterus is present and palpable. Uterus in normal position and normal size.        Adnexae Bilateral ovaries present and palpable. No tenderness on palpation.          Rectovaginal No rectal exam completed today since patient had no rectal complaints. No skin abnormalities observed on exam.

## 2011-10-11 NOTE — Patient Instructions (Signed)
Taught patient how to perform BSE and gave educational materials to take home. Let her know [facility] will cover Pap smears every 3 years unless has a history of abnormal Pap smears. Patient referred to the [facility] for a left breast diagnostic mammogram per recommendation from the [facility]. Appointment scheduled for Friday, Oct 14, 2011 at 1000. Patient aware of appointment and will be there. Discussed with patient elevated BP today of 153/96. Encouraged to have BP taken several times and to keep a log of results. Gave patient form to complete. Patient goes to [facility] for primary care and encouraged her to follow up with them on her BP. Let patient know will follow up with her within the next couple weeks with results. Patient verbalized understanding.

## 2011-10-19 ENCOUNTER — Ambulatory Visit
Admission: RE | Admit: 2011-10-19 | Discharge: 2011-10-19 | Disposition: A | Discharge status: Home / Self Care | Payer: No Typology Code available for payment source | Source: Ambulatory Visit | Attending: Obstetrics and Gynecology | Admitting: Obstetrics and Gynecology

## 2011-10-19 ENCOUNTER — Other Ambulatory Visit: Payer: Self-pay | Admitting: Obstetrics and Gynecology

## 2011-10-19 DIAGNOSIS — R928 Other abnormal and inconclusive findings on diagnostic imaging of breast: Secondary | ICD-10-CM

## 2011-10-19 IMAGING — MG MM DIGITAL DIAGNOSTIC LIMITED*L*
3 series · 3 of 3 positions shown · non-contrast
Comparison: [DATE]

CLINICAL DATA: The patient returns for evaluation of
calcifications in the left breast noted on recent screening study
dated [DATE].

DIGITAL DIAGNOSTIC LEFT MAMMOGRAM

[L CC]
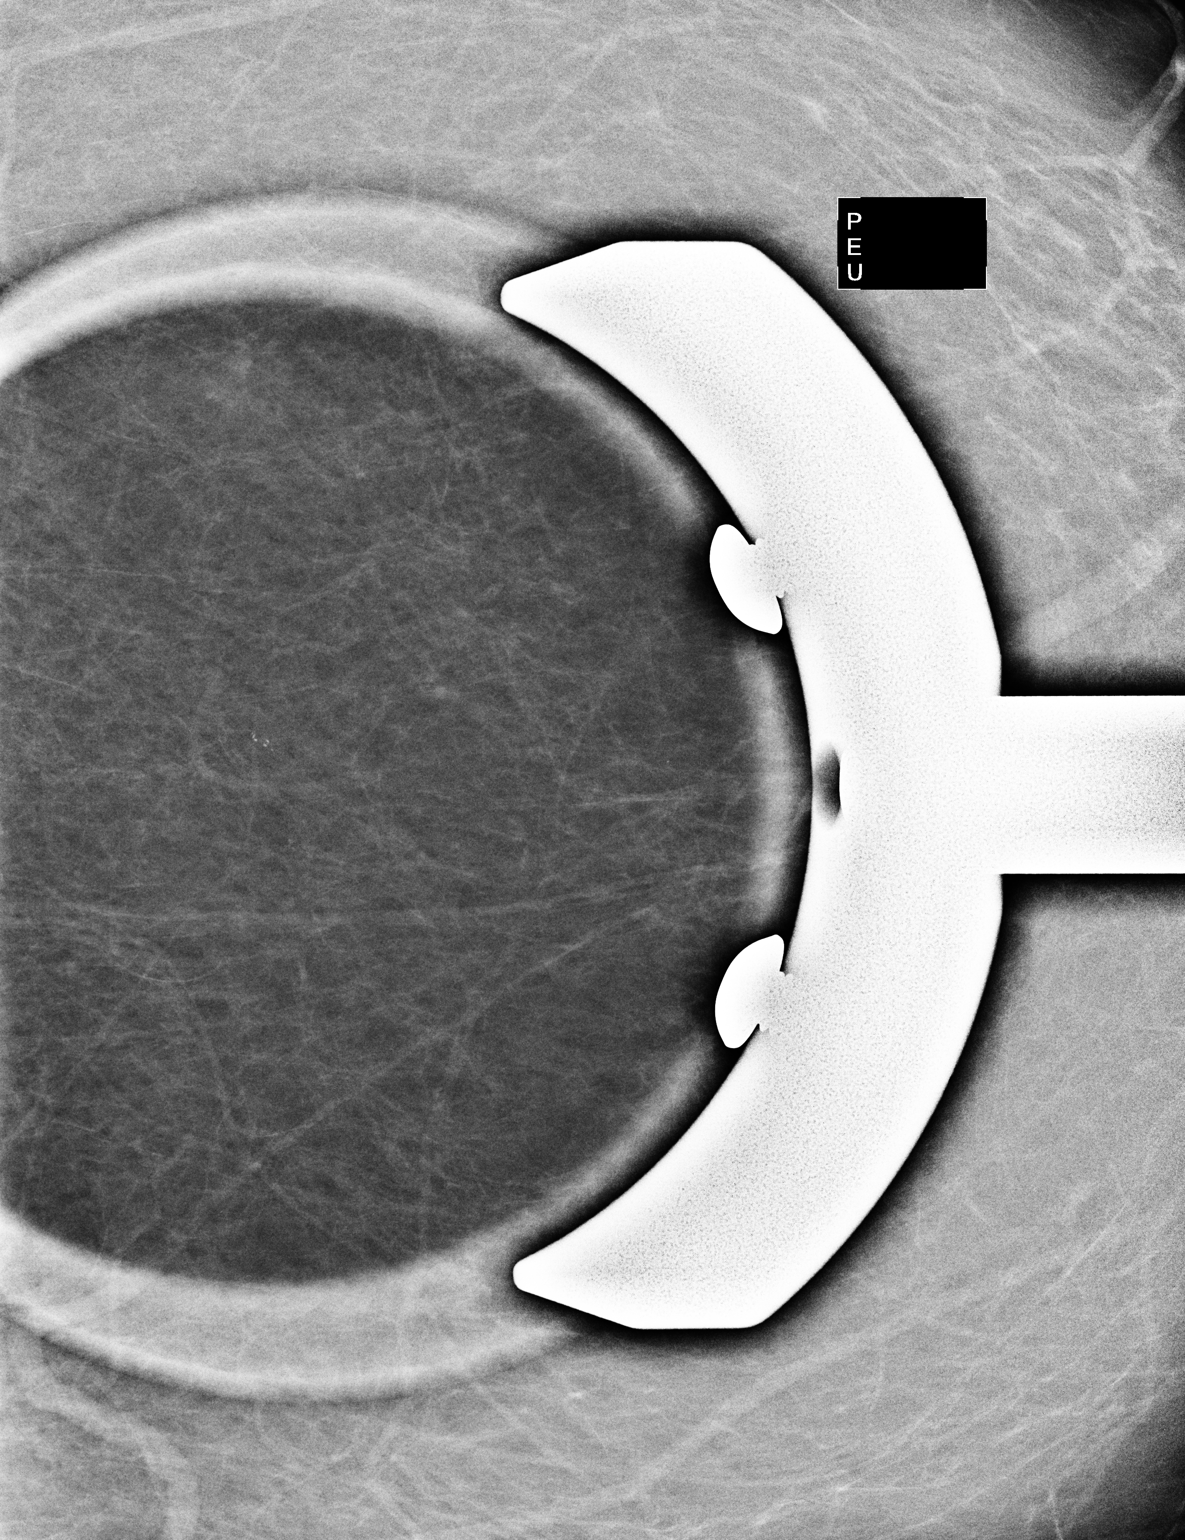

[L ML (1 of 2)]
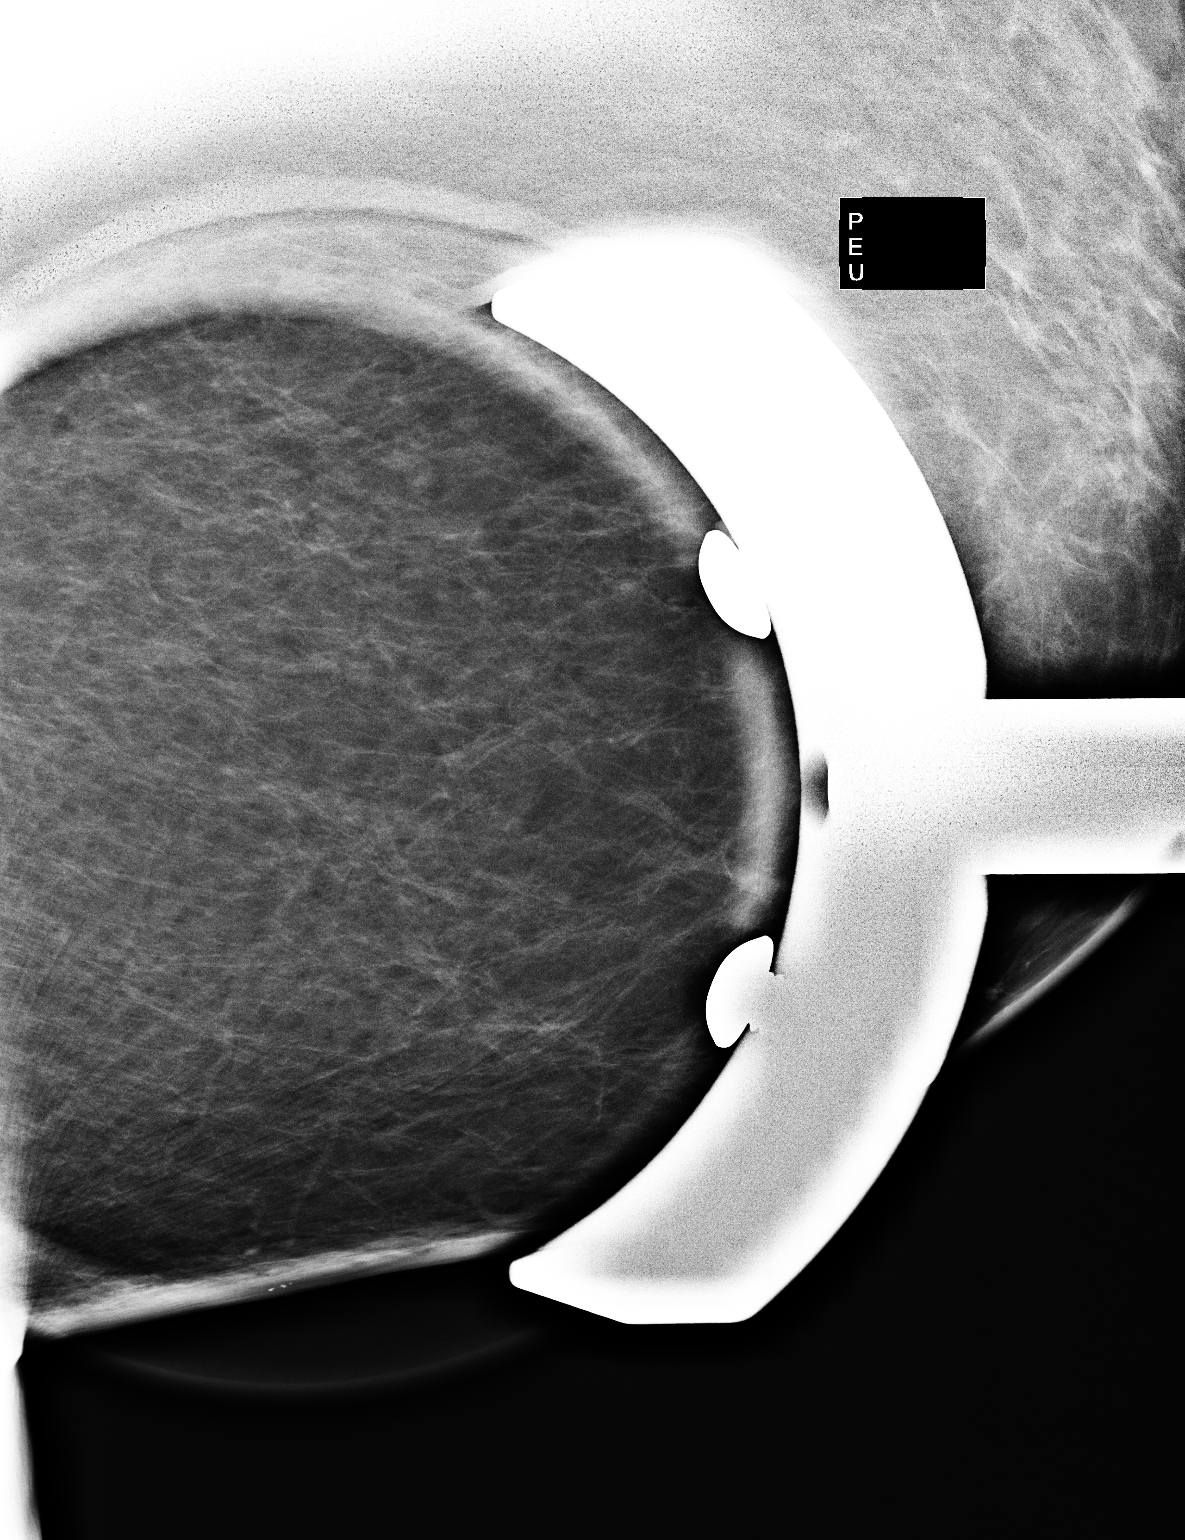

[L ML (2 of 2)]
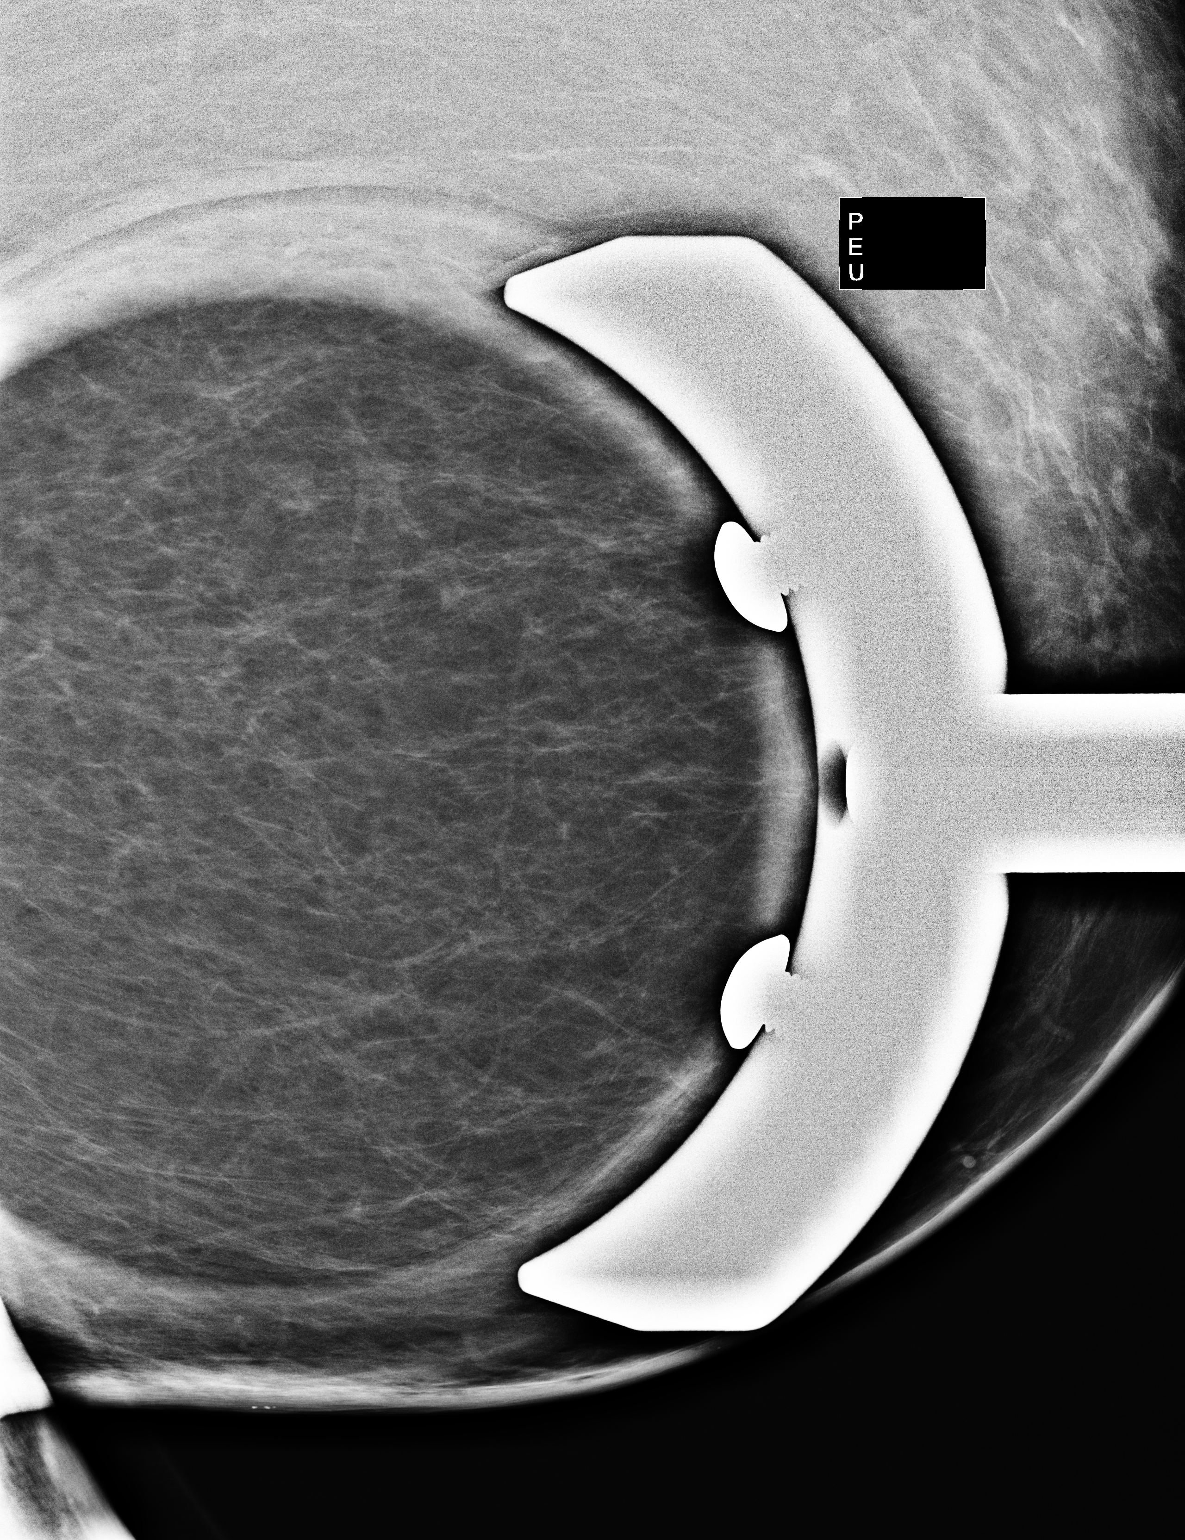

[3 of 3 positions shown; findings below may reference images not displayed]

FINDINGS: Additional views confirm the presence of dermal
calcifications in the 6 o'clock position of the left breast.
IMPRESSION: Benign dermal calcifications at 6 o'clock in the left breast. No
mammographic evidence of malignancy.  Yearly screening mammography
is suggested.

BI-RADS CATEGORY 2:  Benign finding(s).

## 2011-10-21 ENCOUNTER — Telehealth: Payer: Self-pay | Admitting: *Deleted

## 2011-10-21 NOTE — Telephone Encounter (Signed)
Telephoned patient at home # and left message to return call to [employer] [phone]

## 2011-10-25 ENCOUNTER — Telehealth: Payer: Self-pay

## 2011-10-25 NOTE — Telephone Encounter (Signed)
Called and informed pt of normal pap results and to verify that she knew of making yearly screening mammograms from [facility] appt.  Pt stated that she knew of the mammo results and to make yearly screening mammograms.  Pt had no further questions.

## 2012-09-13 ENCOUNTER — Other Ambulatory Visit: Payer: Self-pay | Admitting: Gastroenterology

## 2012-10-12 ENCOUNTER — Ambulatory Visit (INDEPENDENT_AMBULATORY_CARE_PROVIDER_SITE_OTHER): Payer: Medicare Other | Admitting: General Surgery

## 2012-10-19 ENCOUNTER — Ambulatory Visit (INDEPENDENT_AMBULATORY_CARE_PROVIDER_SITE_OTHER): Payer: Medicare Other | Admitting: General Surgery

## 2012-10-19 ENCOUNTER — Encounter (INDEPENDENT_AMBULATORY_CARE_PROVIDER_SITE_OTHER): Payer: Self-pay | Admitting: General Surgery

## 2012-10-19 VITALS — BP 132/84 | HR 70 | Temp 98.9°F | Resp 18 | Ht 63.0 in | Wt 172.2 lb

## 2012-10-19 DIAGNOSIS — K635 Polyp of colon: Secondary | ICD-10-CM

## 2012-10-19 DIAGNOSIS — D126 Benign neoplasm of colon, unspecified: Secondary | ICD-10-CM

## 2012-10-19 NOTE — Progress Notes (Signed)
Patient ID: Erica [name], female   DOB: 27-Aug-1955, 57 y.o.   MRN: [id number]  Chief Complaint  Patient presents with  . New Evaluation    polyp    [name] is a 57 y.o. female.  referred by Dr Tanna Savoy 35 yof who underwent a screening colonoscopy by Dr. Paulita Fujita which showed 3 sessile polyps in the descending and transverse colon that were removed. She has some occasional loose stools and had some brb on toilet paper but is otherwise asymptomatic.There were 12 sessile polyps found in the sigmoid, descending, transverse, and descending colon that were removed completely. There were also many diverticuli found in the sigmoid colon. There was also a sessile polyp found in the sigmoid colon that was 35 mm in size. It was friable. This was not amenable to endoscopic removal. This was biopsied and was tattooed with [place] ink. The remainder of the scope was negative. Her pathology from this showed all of the other polyps to be multiple tubular and tubulovillous adenomas that were resected completely. 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 She comes in today to discuss these findings as well as a possible surgery.  Past Medical History  Diagnosis Date  . Diabetes mellitus   . Hypertension   . Mental disorder     depression  . Arthritis     Past Surgical History  Procedure Laterality Date  . Tonsillectomy and adenoidectomy    . Cesarean section      Family History  Problem Relation Age of Onset  . Hypertension Father   . Stroke Father   . Diabetes Father   . Heart disease Mother   . Hypertension Mother   . Hyperlipidemia Mother   . Hypertension Sister   . Hypertension Brother   . Hypertension Sister   . Hypertension Brother     Social History History  Substance Use Topics  . Smoking status: Current Every Day Smoker -- 0.25 packs/day for 20 years    Types: Cigarettes  . Smokeless tobacco: Not on file  . Alcohol Use: 4.2 oz/week   7 Cans of beer per week    Allergies  Allergen Reactions  . Penicillins     Current Outpatient Prescriptions  Medication Sig Dispense Refill  . amLODipine (NORVASC) 10 MG tablet       . baclofen (LIORESAL) 10 MG tablet       . busPIRone (BUSPAR) 15 MG tablet Take 15 mg by mouth 2 (two) times daily. 1 1/2 bid      . citalopram (CELEXA) 20 MG tablet       . gabapentin (NEURONTIN) 300 MG capsule       . hydrochlorothiazide (HYDRODIURIL) 25 MG tablet Take 12.5 mg by mouth daily.      . metFORMIN (GLUCOPHAGE) 1000 MG tablet Take 1,000 mg by mouth 2 (two) times daily with a meal.      . pantoprazole (PROTONIX) 40 MG tablet Take 40 mg by mouth daily.      Marland Kitchen PROAIR HFA 108 (90 BASE) MCG/ACT inhaler       . traMADol (ULTRAM) 50 MG tablet        No current facility-administered medications for this visit.    Review of Systems Review of Systems  Constitutional: Positive for chills and fatigue. Negative for fever and unexpected weight change.  HENT: Negative for hearing loss, congestion, sore throat, trouble swallowing and voice change.   Eyes: Negative for visual disturbance.  Respiratory:  Positive for shortness of breath. Negative for cough and wheezing.   Cardiovascular: Negative for chest pain, palpitations and leg swelling.  Gastrointestinal: Positive for diarrhea. Negative for nausea, vomiting, abdominal pain, constipation, blood in stool, abdominal distention and anal bleeding.  Genitourinary: Negative for hematuria, vaginal bleeding and difficulty urinating.  Musculoskeletal: Negative for arthralgias.  Skin: Negative for rash and wound.  Neurological: Negative for seizures, syncope and headaches.  Hematological: Negative for adenopathy. Bruises/bleeds easily.  Psychiatric/Behavioral: Negative for confusion.    Blood pressure 132/84, pulse 70, temperature 98.9 F (37.2 C), resp. rate 18, height 5' 3" (1.6 m), weight 172 lb 3.2 oz (78.109 kg).  Physical Exam Physical Exam    Vitals reviewed. Constitutional: She is oriented to person, place, and time. She appears well-developed and well-nourished.  Eyes: No scleral icterus.  Neck: Neck supple.  Cardiovascular: Normal rate, regular rhythm and normal heart sounds.   Pulmonary/Chest: Effort normal and breath sounds normal. She has no wheezes. She has no rales.  Abdominal: Soft. Bowel sounds are normal. She exhibits no distension. There is no tenderness. No hernia.    Musculoskeletal: Normal range of motion.  Lymphadenopathy:    She has no cervical adenopathy.  Neurological: She is alert and oriented to person, place, and time.    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 This is not amenable to endoscopic therapy. I discussed with her the indication for surgery to be both prevention of cancer as well as a possibility there could also be cancer in this larger lesion especially with high-grade dysplasia. I discussed a laparoscopic sigmoid colectomy with the extraction port. We discussed the surgery, preparation, hospital stay being about 4-7 days. We discussed that she would have restrictions for 3-4 weeks and would take probably 2-3 months before she was back to her normal self. She does live alone and I talked to her sister today about the fact that she will need help postoperatively also. We discussed the risks of surgery being but not limited to bleeding, infection which I told her about 10%, anastomotic leak, incision being infected requiring this to be open and healing by secondary intention, intra-abdominal abscess requiring drainage, reoperation, DVT, PE, cardiac complications. She understands all these risks as well as the fact that this may change her  bowel habits moving forward in the future.She and her sister both asked questions. She did have a vasovagal response once we were talking with this and she had a lie down after hearing the details of the procedure as well.I used a  diagram to show the procedure as well. We'll plan on proceeding in the next several weeks.       Mitchelle Sultan 10/19/2012, 9:41 AM

## 2012-10-26 ENCOUNTER — Encounter (INDEPENDENT_AMBULATORY_CARE_PROVIDER_SITE_OTHER): Payer: Self-pay

## 2012-11-02 ENCOUNTER — Other Ambulatory Visit (HOSPITAL_COMMUNITY): Payer: Self-pay | Admitting: General Surgery

## 2012-11-02 NOTE — Patient Instructions (Addendum)
20 KAZIA GRISANTI  11/02/2012   Your procedure is scheduled on: 11-14-2012  Report to [facility] at 630  AM.  Call this number if you have problems the morning of surgery [phone]   Remember: follow bowel prep  Instructions and please bring inhaler on day of surgery    Do not eat food or drink liquids :After Midnight.    Take these medicines the morning of surgery with A SIP OF WATER: amlodipine, citalopram, gabapentin, pantaprozole, tramadol if needed                                [place]   Do not wear jewelry, make-up or nail polish.  Do not wear lotions, powders, or perfumes. You may wear deodorant.   Men may shave face and neck.  Do not bring valuables to the hospital.  Contacts, dentures or bridgework may not be worn into surgery.  Leave suitcase in the car. After surgery it may be brought to your room.  For patients admitted to the hospital, checkout time is 11:00 AM the day of discharge.    Please read over the following fact sheets that you were given: MRSA Information, blood fact sheet  Call Paulette Blanch RN pre op nurse if needed [phone]    [facility]. PATIENT SIGNATURE___________________________________________

## 2012-11-06 ENCOUNTER — Encounter (HOSPITAL_COMMUNITY): Payer: Self-pay | Admitting: Pharmacy Technician

## 2012-11-06 ENCOUNTER — Ambulatory Visit (HOSPITAL_COMMUNITY)
Admission: RE | Admit: 2012-11-06 | Discharge: 2012-11-06 | Disposition: A | Discharge status: Home / Self Care | Payer: Medicare Other | Source: Ambulatory Visit | Attending: General Surgery | Admitting: General Surgery

## 2012-11-06 ENCOUNTER — Encounter (HOSPITAL_COMMUNITY): Payer: Self-pay

## 2012-11-06 ENCOUNTER — Encounter (HOSPITAL_COMMUNITY)
Admission: RE | Admit: 2012-11-06 | Discharge: 2012-11-06 | Disposition: A | Discharge status: Home / Self Care | Payer: Medicare Other | Source: Ambulatory Visit | Attending: General Surgery | Admitting: General Surgery

## 2012-11-06 DIAGNOSIS — Z0183 Encounter for blood typing: Secondary | ICD-10-CM | POA: Insufficient documentation

## 2012-11-06 DIAGNOSIS — J45909 Unspecified asthma, uncomplicated: Secondary | ICD-10-CM | POA: Insufficient documentation

## 2012-11-06 DIAGNOSIS — Z01818 Encounter for other preprocedural examination: Secondary | ICD-10-CM | POA: Insufficient documentation

## 2012-11-06 DIAGNOSIS — I10 Essential (primary) hypertension: Secondary | ICD-10-CM | POA: Insufficient documentation

## 2012-11-06 DIAGNOSIS — Z01810 Encounter for preprocedural cardiovascular examination: Secondary | ICD-10-CM | POA: Insufficient documentation

## 2012-11-06 DIAGNOSIS — Z01812 Encounter for preprocedural laboratory examination: Secondary | ICD-10-CM | POA: Insufficient documentation

## 2012-11-06 HISTORY — DX: Headache: R51

## 2012-11-06 HISTORY — DX: Unspecified asthma, uncomplicated: J45.909

## 2012-11-06 HISTORY — DX: Major depressive disorder, single episode, unspecified: F32.9

## 2012-11-06 HISTORY — DX: Gastro-esophageal reflux disease without esophagitis: K21.9

## 2012-11-06 HISTORY — DX: Depression, unspecified: F32.A

## 2012-11-06 LAB — CBC WITH DIFFERENTIAL/PLATELET
Basophils Absolute: 0 10*3/uL (ref 0.0–0.1)
Basophils Relative: 0 % (ref 0–1)
Eosinophils Absolute: 0.1 10*3/uL (ref 0.0–0.7)
Eosinophils Relative: 2 % (ref 0–5)
HCT: 30.4 % — ABNORMAL LOW (ref 36.0–46.0)
Hemoglobin: 11 g/dL — ABNORMAL LOW (ref 12.0–15.0)
Lymphocytes Relative: 25 % (ref 12–46)
Lymphs Abs: 0.9 10*3/uL (ref 0.7–4.0)
MCH: 33.2 pg (ref 26.0–34.0)
MCHC: 36.2 g/dL — ABNORMAL HIGH (ref 30.0–36.0)
MCV: 91.8 fL (ref 78.0–100.0)
Monocytes Absolute: 0.3 10*3/uL (ref 0.1–1.0)
Monocytes Relative: 8 % (ref 3–12)
Neutro Abs: 2.4 10*3/uL (ref 1.7–7.7)
Neutrophils Relative %: 66 % (ref 43–77)
Platelets: 186 10*3/uL (ref 150–400)
RBC: 3.31 MIL/uL — ABNORMAL LOW (ref 3.87–5.11)
RDW: 15 % (ref 11.5–15.5)
WBC: 3.6 10*3/uL — ABNORMAL LOW (ref 4.0–10.5)

## 2012-11-06 LAB — COMPREHENSIVE METABOLIC PANEL
ALT: 11 U/L (ref 0–35)
AST: 23 U/L (ref 0–37)
Albumin: 3.5 g/dL (ref 3.5–5.2)
Alkaline Phosphatase: 82 U/L (ref 39–117)
BUN: 24 mg/dL — ABNORMAL HIGH (ref 6–23)
CO2: 22 mEq/L (ref 19–32)
Calcium: 9.1 mg/dL (ref 8.4–10.5)
Chloride: 108 mEq/L (ref 96–112)
Creatinine, Ser: 1.46 mg/dL — ABNORMAL HIGH (ref 0.50–1.10)
GFR calc Af Amer: 45 mL/min — ABNORMAL LOW (ref >90)
GFR calc non Af Amer: 39 mL/min — ABNORMAL LOW (ref >90)
Glucose, Bld: 127 mg/dL — ABNORMAL HIGH (ref 70–99)
Potassium: 4 mEq/L (ref 3.5–5.1)
Sodium: 140 mEq/L (ref 135–145)
Total Bilirubin: 0.9 mg/dL (ref 0.3–1.2)
Total Protein: 8 g/dL (ref 6.0–8.3)

## 2012-11-06 LAB — SURGICAL PCR SCREEN
MRSA, PCR: NEGATIVE
Staphylococcus aureus: NEGATIVE

## 2012-11-06 LAB — CEA: CEA: 3.8 ng/mL (ref 0.0–5.0)

## 2012-11-06 IMAGING — CR DG CHEST 2V
2 series · 2 of 2 positions shown · non-contrast
Comparison: [DATE]

CLINICAL DATA: Asthma.  Hypertension.  Preop respiratory exam for
sigmoid colectomy.

CHEST - 2 VIEW

[w chest pa]
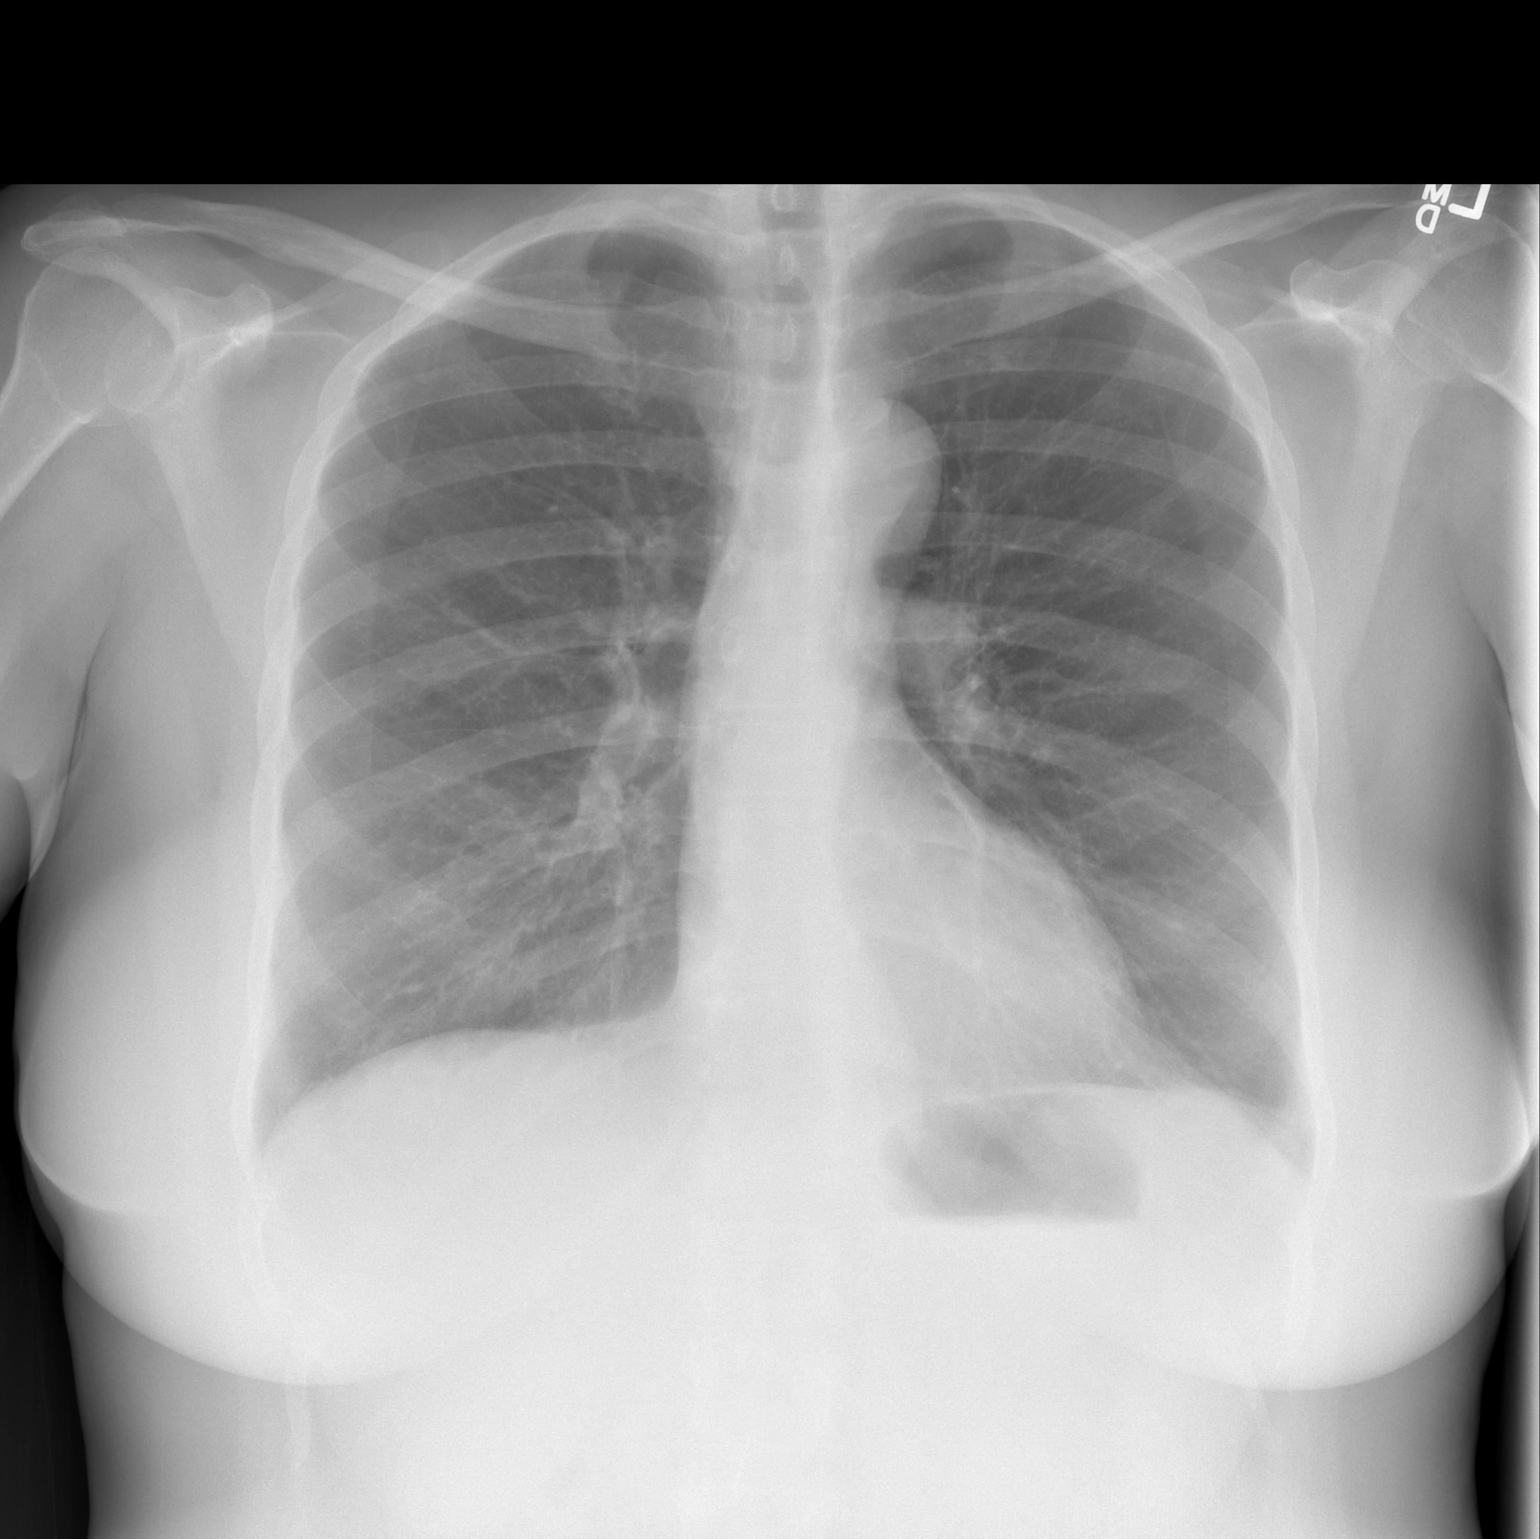

[w chest lat]
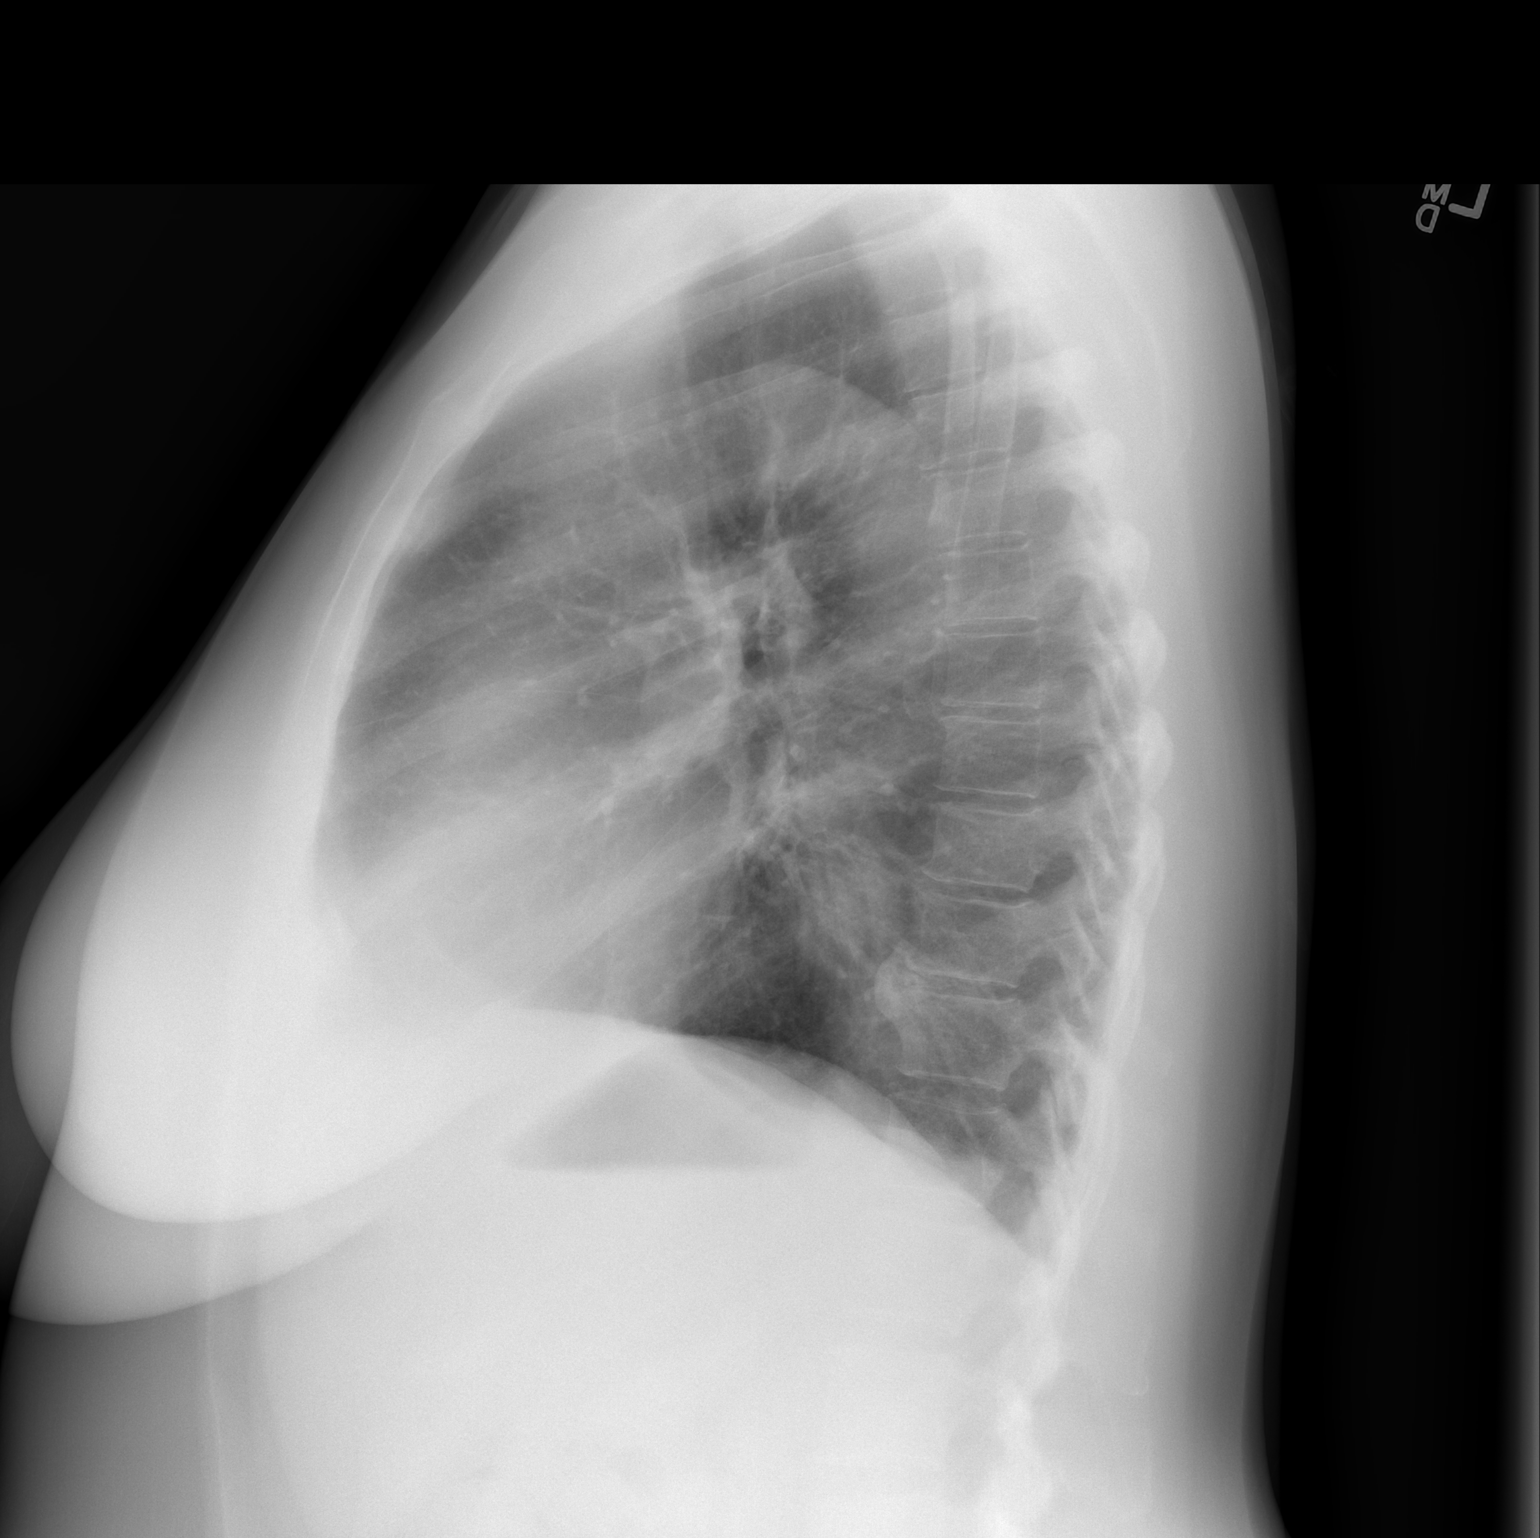

[2 of 2 positions shown; findings below may reference images not displayed]

FINDINGS: Heart size is normal.  Both lungs are clear.  No evidence
of pleural effusion.  Mild tortuosity of thoracic aorta stable.  No
mass or lymphadenopathy identified.
IMPRESSION: Stable exam.  No active disease.

## 2012-11-07 LAB — ABO/RH: ABO/RH(D): O POS

## 2012-11-13 MED ORDER — CLINDAMYCIN PHOSPHATE 900 MG/50ML IV SOLN
900.0000 mg | INTRAVENOUS | Status: DC
  Filled 2012-11-13: qty 50

## 2012-11-13 MED ORDER — GENTAMICIN SULFATE 40 MG/ML IJ SOLN
310.0000 mg | INTRAVENOUS | Status: DC
  Filled 2012-11-13 (×2): qty 7.75

## 2012-11-14 ENCOUNTER — Observation Stay (HOSPITAL_COMMUNITY)
Admission: RE | Admit: 2012-11-14 | Discharge: 2012-11-15 | Disposition: A | Discharge status: Home / Self Care | Payer: Medicare Other | Source: Ambulatory Visit | Attending: General Surgery | Admitting: General Surgery

## 2012-11-14 ENCOUNTER — Encounter (HOSPITAL_COMMUNITY): Payer: Self-pay | Admitting: *Deleted

## 2012-11-14 ENCOUNTER — Encounter (HOSPITAL_COMMUNITY)
Admission: RE | Disposition: A | Discharge status: Home / Self Care | Payer: Self-pay | Source: Ambulatory Visit | Attending: General Surgery

## 2012-11-14 ENCOUNTER — Inpatient Hospital Stay (HOSPITAL_COMMUNITY): Payer: Medicare Other | Admitting: *Deleted

## 2012-11-14 DIAGNOSIS — Z79899 Other long term (current) drug therapy: Secondary | ICD-10-CM | POA: Insufficient documentation

## 2012-11-14 DIAGNOSIS — K620 Anal polyp: Principal | ICD-10-CM | POA: Insufficient documentation

## 2012-11-14 DIAGNOSIS — K621 Rectal polyp: Principal | ICD-10-CM | POA: Insufficient documentation

## 2012-11-14 DIAGNOSIS — F17200 Nicotine dependence, unspecified, uncomplicated: Secondary | ICD-10-CM | POA: Insufficient documentation

## 2012-11-14 DIAGNOSIS — J45909 Unspecified asthma, uncomplicated: Secondary | ICD-10-CM | POA: Insufficient documentation

## 2012-11-14 DIAGNOSIS — K219 Gastro-esophageal reflux disease without esophagitis: Secondary | ICD-10-CM | POA: Insufficient documentation

## 2012-11-14 DIAGNOSIS — D371 Neoplasm of uncertain behavior of stomach: Secondary | ICD-10-CM

## 2012-11-14 DIAGNOSIS — E119 Type 2 diabetes mellitus without complications: Secondary | ICD-10-CM | POA: Insufficient documentation

## 2012-11-14 DIAGNOSIS — D378 Neoplasm of uncertain behavior of other specified digestive organs: Secondary | ICD-10-CM

## 2012-11-14 DIAGNOSIS — I10 Essential (primary) hypertension: Secondary | ICD-10-CM | POA: Insufficient documentation

## 2012-11-14 DIAGNOSIS — D375 Neoplasm of uncertain behavior of rectum: Secondary | ICD-10-CM

## 2012-11-14 HISTORY — PX: LAPAROSCOPIC SIGMOID COLECTOMY: SHX5928

## 2012-11-14 LAB — TYPE AND SCREEN
ABO/RH(D): O POS
Antibody Screen: NEGATIVE

## 2012-11-14 LAB — CBC
HCT: 24.5 % — ABNORMAL LOW (ref 36.0–46.0)
Hemoglobin: 9.1 g/dL — ABNORMAL LOW (ref 12.0–15.0)
MCH: 33.8 pg (ref 26.0–34.0)
MCHC: 37.1 g/dL — ABNORMAL HIGH (ref 30.0–36.0)
MCV: 91.1 fL (ref 78.0–100.0)
Platelets: 149 10*3/uL — ABNORMAL LOW (ref 150–400)
RBC: 2.69 MIL/uL — ABNORMAL LOW (ref 3.87–5.11)
RDW: 15 % (ref 11.5–15.5)
WBC: 6.5 10*3/uL (ref 4.0–10.5)

## 2012-11-14 LAB — CREATININE, SERUM
Creatinine, Ser: 1.36 mg/dL — ABNORMAL HIGH (ref 0.50–1.10)
GFR calc Af Amer: 49 mL/min — ABNORMAL LOW (ref >90)
GFR calc non Af Amer: 42 mL/min — ABNORMAL LOW (ref >90)

## 2012-11-14 LAB — GLUCOSE, CAPILLARY
Glucose-Capillary: 140 mg/dL — ABNORMAL HIGH (ref 70–99)
Glucose-Capillary: 165 mg/dL — ABNORMAL HIGH (ref 70–99)
Glucose-Capillary: 138 mg/dL — ABNORMAL HIGH (ref 70–99)
Glucose-Capillary: 150 mg/dL — ABNORMAL HIGH (ref 70–99)

## 2012-11-14 SURGERY — COLECTOMY, SIGMOID, LAPAROSCOPIC
Anesthesia: General | Wound class: Clean

## 2012-11-14 MED ORDER — ACETAMINOPHEN 650 MG RE SUPP: 650.0000 mg | Freq: Four times a day (QID) | RECTAL | Status: DC | PRN

## 2012-11-14 MED ORDER — CISATRACURIUM BESYLATE (PF) 10 MG/5ML IV SOLN
INTRAVENOUS | Status: DC | PRN
  Administered 2012-11-14: 8 mg via INTRAVENOUS

## 2012-11-14 MED ORDER — LIP MEDEX EX OINT
TOPICAL_OINTMENT | CUTANEOUS | Status: AC
  Administered 2012-11-14: 16:00:00
  Filled 2012-11-14: qty 7

## 2012-11-14 MED ORDER — LACTATED RINGERS IV SOLN
INTRAVENOUS | Status: DC | PRN
  Administered 2012-11-14: 08:00:00 via INTRAVENOUS

## 2012-11-14 MED ORDER — ACETAMINOPHEN 325 MG PO TABS: 650.0000 mg | ORAL_TABLET | Freq: Four times a day (QID) | ORAL | Status: DC | PRN

## 2012-11-14 MED ORDER — ONDANSETRON HCL 4 MG/2ML IJ SOLN: 4.0000 mg | Freq: Four times a day (QID) | INTRAMUSCULAR | Status: DC | PRN

## 2012-11-14 MED ORDER — 0.9 % SODIUM CHLORIDE (POUR BTL) OPTIME
TOPICAL | Status: DC | PRN
  Administered 2012-11-14: 2000 mL

## 2012-11-14 MED ORDER — PANTOPRAZOLE SODIUM 40 MG PO TBEC
40.0000 mg | DELAYED_RELEASE_TABLET | Freq: Every day | ORAL | Status: DC
  Administered 2012-11-15: 40 mg via ORAL
  Filled 2012-11-14: qty 1

## 2012-11-14 MED ORDER — HYDROMORPHONE HCL PF 1 MG/ML IJ SOLN
0.2500 mg | INTRAMUSCULAR | Status: DC | PRN
  Administered 2012-11-14 (×2): 0.5 mg via INTRAVENOUS

## 2012-11-14 MED ORDER — AMLODIPINE BESYLATE 10 MG PO TABS
10.0000 mg | ORAL_TABLET | Freq: Every morning | ORAL | Status: DC
Start: 2012-11-15 — End: 2012-11-15
  Administered 2012-11-15: 10 mg via ORAL
  Filled 2012-11-14: qty 1

## 2012-11-14 MED ORDER — HYDROCHLOROTHIAZIDE 12.5 MG PO CAPS
12.5000 mg | ORAL_CAPSULE | Freq: Every morning | ORAL | Status: DC
  Administered 2012-11-15: 12.5 mg via ORAL
  Filled 2012-11-14: qty 1

## 2012-11-14 MED ORDER — MORPHINE SULFATE 2 MG/ML IJ SOLN
2.0000 mg | INTRAMUSCULAR | Status: DC | PRN
  Administered 2012-11-14: 2 mg via INTRAVENOUS
  Filled 2012-11-14: qty 1

## 2012-11-14 MED ORDER — ALVIMOPAN 12 MG PO CAPS
12.0000 mg | ORAL_CAPSULE | Freq: Once | ORAL | Status: AC
  Administered 2012-11-14: 12 mg via ORAL
  Filled 2012-11-14: qty 1

## 2012-11-14 MED ORDER — SODIUM CHLORIDE 0.9 % IV SOLN
INTRAVENOUS | Status: DC | PRN
  Administered 2012-11-14 (×2): via INTRAVENOUS

## 2012-11-14 MED ORDER — MEPERIDINE HCL 50 MG/ML IJ SOLN
6.2500 mg | INTRAMUSCULAR | Status: DC | PRN
  Administered 2012-11-14: 12.5 mg via INTRAVENOUS

## 2012-11-14 MED ORDER — CLINDAMYCIN PHOSPHATE 900 MG/50ML IV SOLN
INTRAVENOUS | Status: DC | PRN
  Administered 2012-11-14: 900 mg via INTRAVENOUS

## 2012-11-14 MED ORDER — ALBUTEROL SULFATE HFA 108 (90 BASE) MCG/ACT IN AERS
2.0000 | INHALATION_SPRAY | RESPIRATORY_TRACT | Status: DC | PRN
  Filled 2012-11-14: qty 6.7

## 2012-11-14 MED ORDER — INSULIN ASPART 100 UNIT/ML ~~LOC~~ SOLN
0.0000 [IU] | Freq: Three times a day (TID) | SUBCUTANEOUS | Status: DC
  Administered 2012-11-14: 2 [IU] via SUBCUTANEOUS

## 2012-11-14 MED ORDER — PROMETHAZINE HCL 25 MG/ML IJ SOLN: 6.2500 mg | INTRAMUSCULAR | Status: DC | PRN

## 2012-11-14 MED ORDER — LIDOCAINE HCL 4 % MT SOLN
OROMUCOSAL | Status: DC | PRN
  Administered 2012-11-14: 4 mL via TOPICAL

## 2012-11-14 MED ORDER — OXYCODONE HCL 5 MG PO TABS
5.0000 mg | ORAL_TABLET | ORAL | Status: DC | PRN
  Administered 2012-11-15 (×2): 5 mg via ORAL
  Filled 2012-11-14 (×2): qty 1

## 2012-11-14 MED ORDER — LIP MEDEX EX OINT: TOPICAL_OINTMENT | CUTANEOUS | Status: DC | PRN

## 2012-11-14 MED ORDER — NEOSTIGMINE METHYLSULFATE 1 MG/ML IJ SOLN
INTRAMUSCULAR | Status: DC | PRN
  Administered 2012-11-14: 5 mg via INTRAVENOUS

## 2012-11-14 MED ORDER — FENTANYL CITRATE 0.05 MG/ML IJ SOLN
INTRAMUSCULAR | Status: DC | PRN
  Administered 2012-11-14 (×2): 100 ug via INTRAVENOUS
  Administered 2012-11-14: 50 ug via INTRAVENOUS

## 2012-11-14 MED ORDER — DEXAMETHASONE SODIUM PHOSPHATE 10 MG/ML IJ SOLN
INTRAMUSCULAR | Status: DC | PRN
  Administered 2012-11-14: 10 mg via INTRAVENOUS

## 2012-11-14 MED ORDER — BUPIVACAINE-EPINEPHRINE 0.25% -1:200000 IJ SOLN
INTRAMUSCULAR | Status: DC | PRN
  Administered 2012-11-14: 15 mL

## 2012-11-14 MED ORDER — LIDOCAINE HCL (CARDIAC) 20 MG/ML IV SOLN
INTRAVENOUS | Status: DC | PRN
  Administered 2012-11-14: 75 mg via INTRAVENOUS

## 2012-11-14 MED ORDER — CISATRACURIUM BESYLATE (PF) 10 MG/5ML IV SOLN: INTRAVENOUS | Status: DC | PRN

## 2012-11-14 MED ORDER — BACLOFEN 10 MG PO TABS
10.0000 mg | ORAL_TABLET | Freq: Every day | ORAL | Status: DC | PRN
  Filled 2012-11-14: qty 1

## 2012-11-14 MED ORDER — ONDANSETRON HCL 4 MG/2ML IJ SOLN
INTRAMUSCULAR | Status: DC | PRN
  Administered 2012-11-14 (×2): 2 mg via INTRAVENOUS

## 2012-11-14 MED ORDER — LACTATED RINGERS IR SOLN
Status: DC | PRN
  Administered 2012-11-14: 1000 mL

## 2012-11-14 MED ORDER — CITALOPRAM HYDROBROMIDE 20 MG PO TABS
20.0000 mg | ORAL_TABLET | Freq: Every morning | ORAL | Status: DC
  Administered 2012-11-15: 20 mg via ORAL
  Filled 2012-11-14: qty 1

## 2012-11-14 MED ORDER — GABAPENTIN 300 MG PO CAPS
300.0000 mg | ORAL_CAPSULE | Freq: Two times a day (BID) | ORAL | Status: DC
  Administered 2012-11-14 – 2012-11-15 (×2): 300 mg via ORAL
  Filled 2012-11-14 (×4): qty 1

## 2012-11-14 MED ORDER — GLYCOPYRROLATE 0.2 MG/ML IJ SOLN
INTRAMUSCULAR | Status: DC | PRN
  Administered 2012-11-14: .5 mg via INTRAVENOUS
  Administered 2012-11-14: .2 mg via INTRAVENOUS
  Administered 2012-11-14: 0.2 mg via INTRAVENOUS

## 2012-11-14 MED ORDER — SODIUM CHLORIDE 0.9 % IV SOLN: INTRAVENOUS | Status: DC

## 2012-11-14 MED ORDER — MIDAZOLAM HCL 5 MG/5ML IJ SOLN
INTRAMUSCULAR | Status: DC | PRN
  Administered 2012-11-14 (×2): 1 mg via INTRAVENOUS

## 2012-11-14 MED ORDER — SUCCINYLCHOLINE CHLORIDE 20 MG/ML IJ SOLN
INTRAMUSCULAR | Status: DC | PRN
  Administered 2012-11-14: 100 mg via INTRAVENOUS

## 2012-11-14 MED ORDER — SODIUM CHLORIDE 0.9 % IV SOLN
115.5000 mg | INTRAVENOUS | Status: DC | PRN
  Administered 2012-11-14: 310 mg via INTRAVENOUS

## 2012-11-14 MED ORDER — PROPOFOL 10 MG/ML IV BOLUS
INTRAVENOUS | Status: DC | PRN
  Administered 2012-11-14: 175 mg via INTRAVENOUS

## 2012-11-14 MED ORDER — HEPARIN SODIUM (PORCINE) 5000 UNIT/ML IJ SOLN
5000.0000 [IU] | Freq: Three times a day (TID) | INTRAMUSCULAR | Status: DC
  Administered 2012-11-14 – 2012-11-15 (×2): 5000 [IU] via SUBCUTANEOUS
  Filled 2012-11-14 (×5): qty 1

## 2012-11-14 MED ORDER — LACTATED RINGERS IV SOLN: INTRAVENOUS | Status: DC

## 2012-11-14 MED ORDER — MEPERIDINE HCL 50 MG/ML IJ SOLN: 6.2500 mg | INTRAMUSCULAR | Status: DC | PRN

## 2012-11-14 SURGICAL SUPPLY — 86 items
ADH SKN CLS APL DERMABOND .7 (GAUZE/BANDAGES/DRESSINGS) ×1
APPLIER CLIP 5 13 M/L LIGAMAX5 (MISCELLANEOUS)
APR CLP MED LRG 5 ANG JAW (MISCELLANEOUS)
BLADE EXTENDED COATED 6.5IN (ELECTRODE) IMPLANT
BLADE HEX COATED 2.75 (ELECTRODE) ×3 IMPLANT
BLADE SURG SZ10 CARB STEEL (BLADE) IMPLANT
CABLE HIGH FREQUENCY MONO STRZ (ELECTRODE) ×2 IMPLANT
CANISTER SUCTION 2500CC (MISCELLANEOUS) ×2 IMPLANT
CELLS DAT CNTRL 66122 CELL SVR (MISCELLANEOUS) IMPLANT
CHLORAPREP W/TINT 26ML (MISCELLANEOUS) ×2 IMPLANT
CLIP APPLIE 5 13 M/L LIGAMAX5 (MISCELLANEOUS) IMPLANT
CLOTH BEACON ORANGE TIMEOUT ST (SAFETY) ×2 IMPLANT
CONNECTOR 5 IN 1 STRAIGHT STRL (MISCELLANEOUS) IMPLANT
COVER MAYO STAND STRL (DRAPES) ×2 IMPLANT
COVER SURGICAL LIGHT HANDLE (MISCELLANEOUS) ×1 IMPLANT
DECANTER SPIKE VIAL GLASS SM (MISCELLANEOUS) ×2 IMPLANT
DERMABOND ADVANCED (GAUZE/BANDAGES/DRESSINGS) ×1
DERMABOND ADVANCED .7 DNX12 (GAUZE/BANDAGES/DRESSINGS) ×1 IMPLANT
DRAIN CHANNEL 19F RND (DRAIN) ×1 IMPLANT
DRAPE LAPAROSCOPIC ABDOMINAL (DRAPES) ×2 IMPLANT
DRAPE LG THREE QUARTER DISP (DRAPES) IMPLANT
DRAPE WARM FLUID 44X44 (DRAPE) ×2 IMPLANT
DRSG TEGADERM 6X8 (GAUZE/BANDAGES/DRESSINGS) ×1 IMPLANT
DRSG TELFA PLUS 4X6 ADH ISLAND (GAUZE/BANDAGES/DRESSINGS) ×1 IMPLANT
ELECT REM PT RETURN 9FT ADLT (ELECTROSURGICAL) ×2
ELECTRODE REM PT RTRN 9FT ADLT (ELECTROSURGICAL) ×1 IMPLANT
ENSEAL DEVICE STD TIP 35CM (ENDOMECHANICALS) IMPLANT
EVACUATOR SILICONE 100CC (DRAIN) ×2 IMPLANT
GLOVE BIOGEL PI IND STRL 7.0 (GLOVE) ×1 IMPLANT
GLOVE BIOGEL PI IND STRL 7.5 (GLOVE) ×1 IMPLANT
GLOVE BIOGEL PI INDICATOR 7.0 (GLOVE) ×1
GLOVE BIOGEL PI INDICATOR 7.5 (GLOVE) ×1
GLOVE SS BIOGEL STRL SZ 7 (GLOVE) ×1 IMPLANT
GLOVE SUPERSENSE BIOGEL SZ 7 (GLOVE) ×1
GOWN STRL NON-REIN LRG LVL3 (GOWN DISPOSABLE) ×2 IMPLANT
GOWN STRL REIN XL XLG (GOWN DISPOSABLE) ×4 IMPLANT
HAND ACTIVATED (MISCELLANEOUS) ×2 IMPLANT
KIT BASIN OR (CUSTOM PROCEDURE TRAY) ×2 IMPLANT
LEGGING LITHOTOMY PAIR STRL (DRAPES) ×1 IMPLANT
LIGASURE IMPACT 36 18CM CVD LR (INSTRUMENTS) ×1 IMPLANT
NS IRRIG 1000ML POUR BTL (IV SOLUTION) ×4 IMPLANT
PENCIL BUTTON HOLSTER BLD 10FT (ELECTRODE) ×3 IMPLANT
RETRACTOR WND ALEXIS 18 MED (MISCELLANEOUS) IMPLANT
RTRCTR WOUND ALEXIS 18CM MED (MISCELLANEOUS)
SCALPEL HARMONIC ACE (MISCELLANEOUS) ×2 IMPLANT
SCISSORS LAP 5X35 DISP (ENDOMECHANICALS) IMPLANT
SET IRRIG TUBING LAPAROSCOPIC (IRRIGATION / IRRIGATOR) IMPLANT
SLEEVE XCEL OPT CAN 5 100 (ENDOMECHANICALS) ×3 IMPLANT
SOLUTION ANTI FOG 6CC (MISCELLANEOUS) ×2 IMPLANT
SPONGE DRAIN TRACH 4X4 STRL 2S (GAUZE/BANDAGES/DRESSINGS) ×1 IMPLANT
SPONGE GAUZE 4X4 12PLY (GAUZE/BANDAGES/DRESSINGS) ×2 IMPLANT
SPONGE LAP 18X18 X RAY DECT (DISPOSABLE) ×4 IMPLANT
STAPLER VISISTAT 35W (STAPLE) ×2 IMPLANT
STRIP CLOSURE SKIN 1/2X4 (GAUZE/BANDAGES/DRESSINGS) IMPLANT
SUCTION POOLE TIP (SUCTIONS) ×2 IMPLANT
SUT MNCRL AB 4-0 PS2 18 (SUTURE) ×1 IMPLANT
SUT MON AB 4-0 SH 27 (SUTURE) ×2 IMPLANT
SUT PDS AB 1 CT1 27 (SUTURE) IMPLANT
SUT PDS AB 1 CTX 36 (SUTURE) IMPLANT
SUT PDS AB 4-0 SH 27 (SUTURE) IMPLANT
SUT PROLENE 2 0 KS (SUTURE) IMPLANT
SUT SILK 2 0 (SUTURE)
SUT SILK 2 0 SH CR/8 (SUTURE) ×2 IMPLANT
SUT SILK 2-0 18XBRD TIE 12 (SUTURE) ×1 IMPLANT
SUT SILK 3 0 (SUTURE)
SUT SILK 3 0 SH CR/8 (SUTURE) ×2 IMPLANT
SUT SILK 3-0 18XBRD TIE 12 (SUTURE) ×1 IMPLANT
SUT VIC AB 2-0 CT1 27 (SUTURE)
SUT VIC AB 2-0 CT1 27XBRD (SUTURE) IMPLANT
SUT VIC AB 3-0 PS2 18 (SUTURE)
SUT VIC AB 3-0 PS2 18XBRD (SUTURE) IMPLANT
SUT VIC AB 4-0 SH 18 (SUTURE) IMPLANT
SUT VICRYL 0 UR6 27IN ABS (SUTURE) IMPLANT
SYR BULB IRRIGATION 50ML (SYRINGE) ×2 IMPLANT
SYS LAPSCP GELPORT 120MM (MISCELLANEOUS)
SYSTEM LAPSCP GELPORT 120MM (MISCELLANEOUS) ×1 IMPLANT
TOWEL OR 17X26 10 PK STRL BLUE (TOWEL DISPOSABLE) ×3 IMPLANT
TRAY FOLEY CATH 14FRSI W/METER (CATHETERS) ×2 IMPLANT
TRAY LAP CHOLE (CUSTOM PROCEDURE TRAY) ×2 IMPLANT
TROCAR BLADELESS OPT 5 100 (ENDOMECHANICALS) ×1 IMPLANT
TROCAR XCEL BLUNT TIP 100MML (ENDOMECHANICALS) IMPLANT
TROCAR XCEL NON-BLD 5MMX100MML (ENDOMECHANICALS) ×2 IMPLANT
TUBING CONNECTING 10 (TUBING) IMPLANT
TUBING FILTER THERMOFLATOR (ELECTROSURGICAL) ×2 IMPLANT
YANKAUER SUCT BULB TIP 10FT TU (MISCELLANEOUS) ×2 IMPLANT
YANKAUER SUCT BULB TIP NO VENT (SUCTIONS) ×3 IMPLANT

## 2012-11-14 NOTE — Discharge Instructions (Signed)
[facility] LAPAROSCOP[facility]: POST OP INSTRUCTIONS  Always review your discharge instruction sheet given to you by the facility where your surgery was performed. IF YOU HAVE DISABILITY OR FAMILY LEAVE FORMS, YOU MUST BRING THEM TO THE OFFICE FOR PROCESSING.   DO NOT GIVE THEM TO YOUR DOCTOR.  1. A prescription for pain medication may be given to you upon discharge.  Take your pain medication as prescribed, if needed.  If narcotic pain medicine is not needed, then you may take acetaminophen (Tylenol), naprosyn (Alleve), or ibuprofen (Advil) as needed. 2. Take your usually prescribed medications unless otherwise directed. 3. If you need a refill on your pain medication, please contact your pharmacy.  They will contact our office to request authorization. Prescriptions will not be filled after 5pm or on week-ends. 4. You should follow a light diet the first few days after arrival home, such as soup and crackers, etc.  Be sure to include lots of fluids daily. 5. Most patients will experience some swelling and bruising in the area of the incisions.  Ice packs will help.  Swelling and bruising can take several days to resolve.  6. It is common to experience some constipation if taking pain medication after surgery.  Increasing fluid intake and taking a stool softener (such as Colace) will usually help or prevent this problem from occurring.  A mild laxative (Milk of Magnesia or Miralax) should be taken according to package instructions if there are no bowel movements after 48 hours. 7. Unless discharge instructions indicate otherwise, you may remove your bandages 48 hours after surgery, and you may shower at that time.  You may have steri-strips (small skin tapes) in place directly over the incision.  These strips should be left on the skin for 7-10 days.  If your surgeon used skin glue on the incision, you may shower in 24 hours.  The glue will flake  off over the next 2-3 weeks.  Any sutures or staples will be removed at the office during your follow-up visit. 8. ACTIVITIES:  You may resume regular (light) daily activities beginning the next day--such as daily self-care, walking, climbing stairs--gradually increasing activities as tolerated.  You may have sexual intercourse when it is comfortable.  Refrain from any heavy lifting or straining until approved by your doctor. a. You may drive when you are no longer taking prescription pain medication, you can comfortably wear a seatbelt, and you can safely maneuver your car and apply brakes. b. RETURN TO WORK:  __________________________________________________________ 9. You should see your doctor in the office for a follow-up appointment approximately 2-3 weeks after your surgery.  Make sure that you call for this appointment within a day or two after you arrive home to insure a convenient appointment time. 10. OTHER INSTRUCTIONS: __________________________________________________________________________________________________________________________ __________________________________________________________________________________________________________________________ WHEN TO CALL YOUR DOCTOR: 1. Fever over 101.0 2. Inability to urinate 3. Continued bleeding from incision. 4. Increased pain, redness, or drainage from the incision. 5. Increasing abdominal pain  The clinic staff is available to answer your questions during regular business hours.  Please don't hesitate to call and ask to speak to one of the nurses for clinical concerns.  If you have a medical emergency, go to the nearest emergency room or call 911.  A surgeon from [facility] is always on call at the hospital. [name], [place], [place], [place]  [address] ? [address], [place], [place]   [address] [phone] ? [phone] ? [phone] Web site: [email or web address]

## 2012-11-14 NOTE — Op Note (Signed)
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 Diagnostic laparoscopy 2. Sigmoidoscopy Surgeon: Dr. Serita Grammes Asst.: Dr. Leighton Ruff Anesthesia: Gen. Estimated blood loss: Minimal Drains: None Complications: None Specimens: None Sponge needle count correct at end of operation Disposition to recovery stable  Indications: This is a 57 year old female underwent a screening colonoscopy with the finding of multiple polyps. Her number Detrol resected and benign. 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 She I discussed going to the operating room for a sigmoid colectomy. We discussed the risks and benefits prior to beginning.  Procedure: After informed consent was obtained the patient was taken to the operating room. She underwent a bowel preparation at home. She was administered antibiotics per the protocol. She was also administered subcutaneous heparin and entereg. She had sequential compression devices on her legs for DVT prophylaxis. She was placed under general anesthesia. She was placed in the lithotomy position and appropriately padded. A Foley catheter was placed. She was then prepped and draped in the standard sterile surgical fashion. A surgical timeout was performed.  I infiltrated Marcaine in her left upper quadrant. I made an incision and used the Optiview technique to enter into her peritoneum. This was done without any injury. Her abdomen was then insufflated to 15 mmHg pressure. I then placed 3 further 5 mm trocars. One was near the umbilicus, one in the right lower quadrant, and one in the midline upper abdomen. I then used a Harmonic scalpel to release the sigmoid colon from the sidewall. I really wasn't able to view anything until I had done this as it was very adherent. Once I  had done this I moved the small bowel and the omentum out of the way. I then mobilized the sigmoid colon and did not visualize a tattoo. I looked proximally and did not notice a tattoo on the way up into the descending colon. At this point I went into the pelvis. I used a Harmonic scalpel to release the rectum from the sidewall on the left side. As I was doing this several centimeters down on the rectum I noticed that there was an appearance of a tattoo. This was much lower than what we had thought previously. Dr Marcello Moores then performed a rigid proctoscopy while I viewed from above. It appears that this is about at 13-14 cm in the rectum. We brought the sigmoidoscope in and visualized this as well. This is a mass in the posterolateral position. This was certainly in the rectum and not in the sigmoid colon. Biopsies were benign at this point. We discussed the case and decided that the best option would be to stop where we were right now. She certainly might be a candidate for a TEM and not need what would be an LAR.  We will plan on sending her for a rectal ultrasound first. If that appears that this is a benign polyp and I think discussion for a TEM would be reasonable. We then removed the scope. I then removed my trocars and desufflated the abdomen. I then closed these with 4 Monocryl and Dermabond. She tolerated this well was extubated and transferred to recovery stable.

## 2012-11-14 NOTE — H&P (View-Only) (Signed)
Patient ID: Erica [name], female   DOB: 02/17/1956, 57 y.o.   MRN: [id number]  Chief Complaint  Patient presents with  . New Evaluation    polyp    [name] is a 57 y.o. female.  referred by Dr OutlawHPI 57 yof who underwent a screening colonoscopy by Dr. Outlaw which showed 3 sessile polyps in the descending and transverse colon that were removed. She has some occasional loose stools and had some brb on toilet paper but is otherwise asymptomatic.There were 12 sessile polyps found in the sigmoid, descending, transverse, and descending colon that were removed completely. There were also many diverticuli found in the sigmoid colon. There was also a sessile polyp found in the sigmoid colon that was 35 mm in size. It was friable. This was not amenable to endoscopic removal. This was biopsied and was tattooed with [place] ink. The remainder of the scope was negative. Her pathology from this showed all of the other polyps to be multiple tubular and tubulovillous adenomas that were resected completely. 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 She comes in today to discuss these findings as well as a possible surgery.  Past Medical History  Diagnosis Date  . Diabetes mellitus   . Hypertension   . Mental disorder     depression  . Arthritis     Past Surgical History  Procedure Laterality Date  . Tonsillectomy and adenoidectomy    . Cesarean section      Family History  Problem Relation Age of Onset  . Hypertension Father   . Stroke Father   . Diabetes Father   . Heart disease Mother   . Hypertension Mother   . Hyperlipidemia Mother   . Hypertension Sister   . Hypertension Brother   . Hypertension Sister   . Hypertension Brother     Social History History  Substance Use Topics  . Smoking status: Current Every Day Smoker -- 0.25 packs/day for 20 years    Types: Cigarettes  . Smokeless tobacco: Not on file  . Alcohol Use: 4.2 oz/week   7 Cans of beer per week    Allergies  Allergen Reactions  . Penicillins     Current Outpatient Prescriptions  Medication Sig Dispense Refill  . amLODipine (NORVASC) 10 MG tablet       . baclofen (LIORESAL) 10 MG tablet       . busPIRone (BUSPAR) 15 MG tablet Take 15 mg by mouth 2 (two) times daily. 1 1/2 bid      . citalopram (CELEXA) 20 MG tablet       . gabapentin (NEURONTIN) 300 MG capsule       . hydrochlorothiazide (HYDRODIURIL) 25 MG tablet Take 12.5 mg by mouth daily.      . metFORMIN (GLUCOPHAGE) 1000 MG tablet Take 1,000 mg by mouth 2 (two) times daily with a meal.      . pantoprazole (PROTONIX) 40 MG tablet Take 40 mg by mouth daily.      . PROAIR HFA 108 (90 BASE) MCG/ACT inhaler       . traMADol (ULTRAM) 50 MG tablet        No current facility-administered medications for this visit.    Review of Systems Review of Systems  Constitutional: Positive for chills and fatigue. Negative for fever and unexpected weight change.  HENT: Negative for hearing loss, congestion, sore throat, trouble swallowing and voice change.   Eyes: Negative for visual disturbance.  Respiratory:   Positive for shortness of breath. Negative for cough and wheezing.   Cardiovascular: Negative for chest pain, palpitations and leg swelling.  Gastrointestinal: Positive for diarrhea. Negative for nausea, vomiting, abdominal pain, constipation, blood in stool, abdominal distention and anal bleeding.  Genitourinary: Negative for hematuria, vaginal bleeding and difficulty urinating.  Musculoskeletal: Negative for arthralgias.  Skin: Negative for rash and wound.  Neurological: Negative for seizures, syncope and headaches.  Hematological: Negative for adenopathy. Bruises/bleeds easily.  Psychiatric/Behavioral: Negative for confusion.    Blood pressure 132/84, pulse 70, temperature 98.9 F (37.2 C), resp. rate 18, height 5' 3" (1.6 m), weight 172 lb 3.2 oz (78.109 kg).  Physical Exam Physical Exam    Vitals reviewed. Constitutional: She is oriented to person, place, and time. She appears well-developed and well-nourished.  Eyes: No scleral icterus.  Neck: Neck supple.  Cardiovascular: Normal rate, regular rhythm and normal heart sounds.   Pulmonary/Chest: Effort normal and breath sounds normal. She has no wheezes. She has no rales.  Abdominal: Soft. Bowel sounds are normal. She exhibits no distension. There is no tenderness. No hernia.    Musculoskeletal: Normal range of motion.  Lymphadenopathy:    She has no cervical adenopathy.  Neurological: She is alert and oriented to person, place, and time.    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 This is not amenable to endoscopic therapy. I discussed with her the indication for surgery to be both prevention of cancer as well as a possibility there could also be cancer in this larger lesion especially with high-grade dysplasia. I discussed a laparoscopic sigmoid colectomy with the extraction port. We discussed the surgery, preparation, hospital stay being about 4-7 days. We discussed that she would have restrictions for 3-4 weeks and would take probably 2-3 months before she was back to her normal self. She does live alone and I talked to her sister today about the fact that she will need help postoperatively also. We discussed the risks of surgery being but not limited to bleeding, infection which I told her about 10%, anastomotic leak, incision being infected requiring this to be open and healing by secondary intention, intra-abdominal abscess requiring drainage, reoperation, DVT, PE, cardiac complications. She understands all these risks as well as the fact that this may change her  bowel habits moving forward in the future.She and her sister both asked questions. She did have a vasovagal response once we were talking with this and she had a lie down after hearing the details of the procedure as well.I used a  diagram to show the procedure as well. We'll plan on proceeding in the next several weeks.       Lace Chenevert 10/19/2012, 9:41 AM

## 2012-11-14 NOTE — Preoperative (Signed)
Beta Blockers   Reason not to administer Beta Blockers:Not Applicable 

## 2012-11-14 NOTE — Transfer of Care (Signed)
Immediate Anesthesia Transfer of Care Note  Patient: Erica [name]  Procedure(s) Performed: Procedure(s): DIAGNOSTIC LAPAROSCOPY AND SIGMOIDMOIDOSCOPY  (N/A)  Patient Location: PACU  Anesthesia Type:General  Level of Consciousness: awake, alert , oriented and patient cooperative  Airway & Oxygen Therapy: Patient Spontanous Breathing and Patient connected to face mask oxygen  Post-op Assessment: Report given to PACU RN, Post -op Vital signs reviewed and stable and Patient moving all extremities  Post vital signs: Reviewed and stable  Complications: No apparent anesthesia complications

## 2012-11-14 NOTE — Interval H&P Note (Signed)
History and Physical Interval Note:  11/14/2012 8:02 AM  Erica [name]  has presented today for surgery, with the diagnosis of sigmoid polyp  The various methods of treatment have been discussed with the patient and family. After consideration of risks, benefits and other options for treatment, the patient has consented to  Procedure(s): LAPAROSCOPIC SIGMOID COLECTOMY (N/A) as a surgical intervention .  The patient's history has been reviewed, patient examined, no change in status, stable for surgery.  I have reviewed the patient's chart and labs.  Questions were answered to the patient's satisfaction.     Yahia Bottger

## 2012-11-14 NOTE — Anesthesia Preprocedure Evaluation (Addendum)
Anesthesia Evaluation  Patient identified by MRN, date of birth, ID band Patient awake    Reviewed: Allergy & Precautions, H&P , NPO status , Patient's Chart, lab work & pertinent test results  Airway Mallampati: II TM Distance: >3 FB Neck ROM: Full    Dental no notable dental hx.    Pulmonary asthma , Current Smoker,  breath sounds clear to auscultation  Pulmonary exam normal       Cardiovascular hypertension, Pt. on medications Rhythm:Regular Rate:Normal     Neuro/Psych negative neurological ROS  negative psych ROS   GI/Hepatic Neg liver ROS, GERD-  Medicated and Controlled,  Endo/Other  negative endocrine ROSdiabetes, Type 2, Oral Hypoglycemic Agents  Renal/GU negative Renal ROS  negative genitourinary   Musculoskeletal negative musculoskeletal ROS (+)   Abdominal   Peds negative pediatric ROS (+)  Hematology negative hematology ROS (+)   Anesthesia Other Findings Upper front teeth caps  Reproductive/Obstetrics negative OB ROS                          Anesthesia Physical Anesthesia Plan  ASA: II  Anesthesia Plan: General   Post-op Pain Management:    Induction: Intravenous  Airway Management Planned: Oral ETT  Additional Equipment:   Intra-op Plan:   Post-operative Plan: Extubation in OR  Informed Consent: I have reviewed the patients History and Physical, chart, labs and discussed the procedure including the risks, benefits and alternatives for the proposed anesthesia with the patient or authorized representative who has indicated his/her understanding and acceptance.   Dental advisory given  Plan Discussed with: CRNA  Anesthesia Plan Comments:         Anesthesia Quick Evaluation

## 2012-11-14 NOTE — Anesthesia Postprocedure Evaluation (Signed)
  Anesthesia Post-op Note  Patient: Erica [name]  Procedure(s) Performed: Procedure(s) (LRB): DIAGNOSTIC LAPAROSCOPY AND SIGMOIDMOIDOSCOPY  (N/A)  Patient Location: PACU  Anesthesia Type: General  Level of Consciousness: awake and alert   Airway and Oxygen Therapy: Patient Spontanous Breathing  Post-op Pain: mild  Post-op Assessment: Post-op Vital signs reviewed, Patient's Cardiovascular Status Stable, Respiratory Function Stable, Patent Airway and No signs of Nausea or vomiting  Last Vitals:  Filed Vitals:   11/14/12 1345  BP: 112/71  Pulse: 60  Temp: 36.7 C  Resp: 14    Post-op Vital Signs: stable   Complications: No apparent anesthesia complications

## 2012-11-14 NOTE — Progress Notes (Signed)
Pt iv line disconnected line from or extension tubing.  Tubing back up with blood and small amount on floor. Tubing clamped and removed.  IV line flushed and tubing changed.  Dr Marcello Moores made aware.  Patient vital signs are stable (see flowsheet).  Patient reports no ill effects.  Patient requeted foley catheter removed Dr Marcello Moores aware catheter removed.   No new orders at this time

## 2012-11-15 ENCOUNTER — Encounter (HOSPITAL_COMMUNITY): Payer: Self-pay | Admitting: General Surgery

## 2012-11-15 LAB — GLUCOSE, CAPILLARY: Glucose-Capillary: 87 mg/dL (ref 70–99)

## 2012-11-15 MED ORDER — SODIUM CHLORIDE 0.9 % IJ SOLN: 3.0000 mL | Freq: Two times a day (BID) | INTRAMUSCULAR | Status: DC

## 2012-11-15 MED ORDER — OXYCODONE HCL 5 MG PO TABS: 5.0000 mg | ORAL_TABLET | ORAL | Status: DC | PRN

## 2012-11-15 NOTE — Progress Notes (Signed)
Assessment unchanged. Pt verbalized understanding of dc instructions as well as My Chart introduction through teach back. Scripts X 1 given as provided by MD. Discharged via wheelchair to front entrance to meet awaiting vehicle to carry home. Accompanied by family and NT.

## 2012-11-15 NOTE — Care Management Note (Deleted)
Page 1 of 2   11/15/2012     11:17:14 AM Rolm Bookbinder, MD Physician Signed  Discharge Summaries Service date: 11/15/2012 7:54 AM  Physician Discharge Summary    Patient ID: Erica [name] MRN: [id number] DOB/AGE: 58-23-1957 57 y.o.   Admit date: 11/14/2012 Discharge date: 11/15/2012   Admission Diagnoses: Unresectable sigmoid tva with hgd Diabetes   Hypertension   Discharge Diagnoses:   Same as above except rectal tva   Discharged Condition: good   Hospital Course: 7 yof who had sigmoid polyp that was unresectable by endoscopy and presented yesterday for resection.  This eventually was found to be in rectum and might be amenable to more local therapy.  We did diagnostic lsc and sigmoidoscopy to find polyp at 13-14 cm.  Decided to stop and she has recovered well from this.  Will be discharged today with plan to see Dr Paulita Fujita to undergo ultrasound and then I will see her back.   Consults: None   Significant Diagnostic Studies: none   Treatments: surgery: dx lsc     Discharge Exam: Blood pressure 124/74, pulse 61, temperature 98.5 F (36.9 C), temperature source Oral, resp. rate 20, height _0  (1.6 m), weight 171 lb (77.565 kg), SpO2 96.00%. General appearance: no distress GI: soft approp tender wounds clean   Disposition:home    Medication List      TAKE these medications           albuterol 108 (90 BASE) MCG/ACT inhaler   Commonly known as:  PROVENTIL HFA;VENTOLIN HFA   Inhale 2 puffs into the lungs every 4 (four) hours as needed for wheezing.         amLODipine 10 MG tablet   Commonly known as:  NORVASC   Take 10 mg by mouth every morning.         baclofen 10 MG tablet   Commonly known as:  LIORESAL   Take 10 mg by mouth daily as needed (for stomach).         citalopram 20 MG tablet   Commonly known as:  CELEXA   Take 20 mg by mouth every morning.         gabapentin 300 MG capsule   Commonly known as:  NEURONTIN   Take 300 mg by mouth 2  (two) times daily.         hydrochlorothiazide 12.5 MG capsule   Commonly known as:  MICROZIDE   Take 12.5 mg by mouth every morning.         metFORMIN 1000 MG tablet   Commonly known as:  GLUCOPHAGE   Take 1,000 mg by mouth 2 (two) times daily with [name] meal.         oxyCODONE 5 MG immediate release tablet   Commonly known as:  Oxy IR/ROXICODONE   Take 1 tablet (5 mg total) by mouth every 4 (four) hours as needed.         pantoprazole 40 MG tablet   Commonly known as:  PROTONIX   Take 40 mg by mouth daily.         traMADol 50 MG tablet   Commonly known as:  ULTRAM   Take 50 mg by mouth every 6 (six) hours as needed.                 Follow-up Information     Follow up with [facility], MD In 3 weeks.     Contact information:     [address] [place] [address] [phone]          Signed: Rolm Bookbinder 11/15/2012, 7:54 AM      Routing History...     Date/Time From To Method   11/15/2012 7:56 AM Rolm Bookbinder, MD Rolm Bookbinder, MD In Basket   11/15/2012 7:56 AM Rolm Bookbinder, MD Philis Fendt, MD Fax         CARE MANAGEMENT NOTE 11/15/2012  Patient:  Erica [name],Erica [name]   Account Number:  [id number]  Date Initiated:  11/15/2012  Documentation initiated by:  Sunday Spillers  Subjective/Objective Assessment:   57 yo female admitted s/p laproscopy. PTA lived at home alone.     Action/Plan:   Home when stable   Anticipated DC Date:  11/15/2012   Anticipated DC Plan:  [place]  CM consult      Choice offered to / List presented to:             Status of service:  Completed, signed off Medicare Important Message given?   (If response is "NO", the following Medicare IM given date fields will be blank) Date Medicare IM given:   Date Additional Medicare IM given:    Discharge Disposition:  HOME/SELF CARE  Per UR Regulation:  Reviewed for med. necessity/level of care/duration of  stay  If discussed at [place] of Stay Meetings, dates discussed:    Comments:

## 2012-11-15 NOTE — Discharge Summary (Signed)
Physician Discharge Summary  Patient ID: Erica [name] MRN: [id number] DOB/AGE: Jul 03, 1955 57 y.o.  Admit date: 11/14/2012 Discharge date: 11/15/2012  Admission Diagnoses: Unresectable sigmoid tva with hgd Diabetes  Hypertension  Discharge Diagnoses:  Same as above except rectal tva  Discharged Condition: good  Hospital Course: 58 yof who had sigmoid polyp that was unresectable by endoscopy and presented yesterday for resection.  This eventually was found to be in rectum and might be amenable to more local therapy.  We did diagnostic lsc and sigmoidoscopy to find polyp at 13-14 cm.  Decided to stop and she has recovered well from this.  Will be discharged today with plan to see Dr Paulita Fujita to undergo ultrasound and then I will see her back.  Consults: None  Significant Diagnostic Studies: none  Treatments: surgery: dx lsc   Discharge Exam: Blood pressure 124/74, pulse 61, temperature 98.5 F (36.9 C), temperature source Oral, resp. rate 20, height _0  (1.6 m), weight 171 lb (77.565 kg), SpO2 96.00%. General appearance: no distress GI: soft approp tender wounds clean  Disposition:home    Medication List    TAKE these medications       albuterol 108 (90 BASE) MCG/ACT inhaler  Commonly known as:  PROVENTIL HFA;VENTOLIN HFA  Inhale 2 puffs into the lungs every 4 (four) hours as needed for wheezing.     amLODipine 10 MG tablet  Commonly known as:  NORVASC  Take 10 mg by mouth every morning.     baclofen 10 MG tablet  Commonly known as:  LIORESAL  Take 10 mg by mouth daily as needed (for stomach).     citalopram 20 MG tablet  Commonly known as:  CELEXA  Take 20 mg by mouth every morning.     gabapentin 300 MG capsule  Commonly known as:  NEURONTIN  Take 300 mg by mouth 2 (two) times daily.     hydrochlorothiazide 12.5 MG capsule  Commonly known as:  MICROZIDE  Take 12.5 mg by mouth every morning.     metFORMIN 1000 MG tablet  Commonly known as:  GLUCOPHAGE  Take  1,000 mg by mouth 2 (two) times daily with a meal.     oxyCODONE 5 MG immediate release tablet  Commonly known as:  Oxy IR/ROXICODONE  Take 1 tablet (5 mg total) by mouth every 4 (four) hours as needed.     pantoprazole 40 MG tablet  Commonly known as:  PROTONIX  Take 40 mg by mouth daily.     traMADol 50 MG tablet  Commonly known as:  ULTRAM  Take 50 mg by mouth every 6 (six) hours as needed.           Follow-up Information   Follow up with [facility], MD In 3 weeks.   Contact information:   [address] [place] [address] [phone]       Signed: Rolm Bookbinder 11/15/2012, 7:54 AM

## 2012-11-15 NOTE — Care Management Note (Signed)
    Page 1 of 1   11/15/2012     11:19:30 AM   CARE MANAGEMENT NOTE 11/15/2012  Patient:  Erica [name], Erica [name]   Account Number:  [id number]  Date Initiated:  11/15/2012  Documentation initiated by:  Sunday Spillers  Subjective/Objective Assessment:   57 yo female admitted s/p laproscopy. PTA lived at home alone.     Action/Plan:   Home when stable   Anticipated DC Date:  11/15/2012   Anticipated DC Plan:  [place]  CM consult      Choice offered to / List presented to:             Status of service:  Completed, signed off Medicare Important Message given?   (If response is "NO", the following Medicare IM given date fields will be blank) Date Medicare IM given:   Date Additional Medicare IM given:    Discharge Disposition:  HOME/SELF CARE  Per UR Regulation:  Reviewed for med. necessity/level of care/duration of stay  If discussed at [place] of Stay Meetings, dates discussed:    Comments:

## 2012-11-19 ENCOUNTER — Telehealth (INDEPENDENT_AMBULATORY_CARE_PROVIDER_SITE_OTHER): Payer: Self-pay

## 2012-11-19 NOTE — Telephone Encounter (Signed)
Called pt's son to speak to him about the disability paper work but he didn't answer. You can't leave messages on his phone. I will notify medical records that i have tried to call him.

## 2012-11-19 NOTE — Telephone Encounter (Signed)
Jeneen Rinks returned my call. We talked about the disability forms that he has for [place] to complete for him to be out of work to help with his mom's recovery. The pt had surgery on 11/14/12 by Dr Donne Hazel but it was not like they had planned so Dr Donne Hazel backed out of the pt just doing an exploratory so the recovery only being for about a week. The pt was then referred to Dr Paulita Fujita for a EUS that is being done on 11/21/12. The pt will follow back up with [place] once Dr Donne Hazel gets the EUS results to see what the next step will be from [place]. The pt's son wants [place] to wait on the disability forms for now since everything has changed so much. I did advise that they pt's son might have to get additional forms for Dr Paulita Fujita to complete to. The pt's son understands.

## 2012-11-20 ENCOUNTER — Other Ambulatory Visit: Payer: Self-pay | Admitting: Gastroenterology

## 2012-11-20 NOTE — Addendum Note (Signed)
Addended by: Arta Silence on: 11/20/2012 01:32 PM   Modules accepted: Orders

## 2012-11-21 ENCOUNTER — Encounter (HOSPITAL_COMMUNITY): Payer: Self-pay | Admitting: *Deleted

## 2012-11-21 ENCOUNTER — Encounter (HOSPITAL_COMMUNITY)
Admission: RE | Disposition: A | Discharge status: Home / Self Care | Payer: Self-pay | Source: Ambulatory Visit | Attending: Gastroenterology

## 2012-11-21 ENCOUNTER — Ambulatory Visit (HOSPITAL_COMMUNITY)
Admission: RE | Admit: 2012-11-21 | Discharge: 2012-11-21 | Disposition: A | Discharge status: Home / Self Care | Payer: Medicare Other | Source: Ambulatory Visit | Attending: Gastroenterology | Admitting: Gastroenterology

## 2012-11-21 DIAGNOSIS — K621 Rectal polyp: Secondary | ICD-10-CM | POA: Insufficient documentation

## 2012-11-21 DIAGNOSIS — D126 Benign neoplasm of colon, unspecified: Secondary | ICD-10-CM | POA: Insufficient documentation

## 2012-11-21 DIAGNOSIS — K620 Anal polyp: Secondary | ICD-10-CM | POA: Insufficient documentation

## 2012-11-21 HISTORY — PX: FLEXIBLE SIGMOIDOSCOPY: SHX5431

## 2012-11-21 HISTORY — PX: EUS: SHX5427

## 2012-11-21 LAB — GLUCOSE, CAPILLARY: Glucose-Capillary: 137 mg/dL — ABNORMAL HIGH (ref 70–99)

## 2012-11-21 SURGERY — ULTRASOUND, LOWER GI TRACT, ENDOSCOPIC
Anesthesia: Moderate Sedation

## 2012-11-21 MED ORDER — SODIUM CHLORIDE 0.9 % IV SOLN
INTRAVENOUS | Status: DC
  Administered 2012-11-21: 500 mL via INTRAVENOUS

## 2012-11-21 MED ORDER — DIPHENHYDRAMINE HCL 50 MG/ML IJ SOLN
INTRAMUSCULAR | Status: AC
  Filled 2012-11-21: qty 1

## 2012-11-21 MED ORDER — MIDAZOLAM HCL 10 MG/2ML IJ SOLN
INTRAMUSCULAR | Status: AC
  Filled 2012-11-21: qty 2

## 2012-11-21 MED ORDER — FENTANYL CITRATE 0.05 MG/ML IJ SOLN
INTRAMUSCULAR | Status: DC | PRN
  Administered 2012-11-21 (×2): 25 ug via INTRAVENOUS

## 2012-11-21 MED ORDER — FENTANYL CITRATE 0.05 MG/ML IJ SOLN
INTRAMUSCULAR | Status: AC
  Filled 2012-11-21: qty 2

## 2012-11-21 MED ORDER — MIDAZOLAM HCL 10 MG/2ML IJ SOLN
INTRAMUSCULAR | Status: DC | PRN
  Administered 2012-11-21 (×2): 2 mg via INTRAVENOUS

## 2012-11-21 NOTE — Discharge Instructions (Signed)
Endorectal ultrasound (rectal ultrasound)  Post procedure instructions:  Read the instructions outlined below and refer to this sheet in the next few weeks. These discharge instructions provide you with general information on caring for yourself after you leave the hospital. Your doctor may also give you specific instructions. While your treatment has been planned according to the most current medical practices available, unavoidable complications occasionally occur. If you have any problems or questions after discharge, call Dr. Paulita Fujita at [facility] [phone]).  HOME CARE INSTRUCTIONS  ACTIVITY:  You may resume your regular activity, but move at a slower pace for the next 24 hours.   Take frequent rest periods for the next 24 hours.   Walking will help get rid of the air and reduce the bloated feeling in your belly (abdomen).   No driving for 24 hours (because of the medicine (anesthesia) used during the test).   You may shower.   Do not sign any important legal documents or operate any machinery for 24 hours (because of the anesthesia used during the test).  NUTRITION:  Drink plenty of fluids.   You may resume your normal diet as instructed by your doctor.   Begin with a light meal and progress to your normal diet. Heavy or fried foods are harder to digest and may make you feel sick to your stomach (nauseated).   Avoid alcoholic beverages for 24 hours or as instructed.  MEDICATIONS:  You may resume your normal medications unless your doctor tells you otherwise.  WHAT TO EXPECT TODAY:  Some feelings of bloating in the abdomen.   Passage of more gas than usual.   Spotting of blood in your stool or on the toilet paper.  IF YOU HAD POLYPS REMOVED DURING THE COLONOSCOPY:  No aspirin products for 7 days or as instructed.   No alcohol for 7 days or as instructed.   Eat a soft diet for the next 24 hours.   FINDING OUT THE RESULTS OF YOUR TEST  Not all test  results are available during your visit. If your test results are not back during the visit, make an appointment with your caregiver to find out the results. Do not assume everything is normal if you have not heard from your caregiver or the medical facility. It is important for you to follow up on all of your test results.     SEEK IMMEDIATE MEDICAL CARE IF:   You have more than a spotting of blood in your stool.   Your belly is swollen (abdominal distention).   You are nauseated or vomiting.   You have a fever.   You have abdominal pain or discomfort that is severe or gets worse throughout the day.    Document Released: 01/12/2004 Document Revised: 02/09/2011 Document Reviewed: 01/10/2008 [facility] Patient Information 2012 [place].

## 2012-11-21 NOTE — H&P (Signed)
Patient interval history reviewed.  Patient examined again.  There has been no change from documented H/P dated 11/20/12 (scanned into chart from our office) except as documented above.  Assessment:  1.  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  Plan:  1.  Rectal ultrasound. 2.  Risks (bleeding, infection, bowel perforation that could require surgery, sedation-related changes in cardiopulmonary systems), benefits (identification and possible treatment of source of symptoms, exclusion of certain causes of symptoms), and alternatives (watchful waiting, radiographic imaging studies, empiric medical treatment) of endorectal ultrasound were explained to patient in detail and patient wishes to proceed.

## 2012-11-21 NOTE — Op Note (Addendum)
[facility] [place], [id number]   OPERATIVE PROCEDURE REPORT  PATIENT: Erica [name], Erica [name]  MR#: [id number] BIRTHDATE: 24-Apr-1956  GENDER: Female ENDOSCOPIST: Arta Silence, MD REFERRED BY:  Rolm Bookbinder, M.D. PROCEDURE DATE:  11/21/2012 PROCEDURE:   Endorectal ultrasound ASA CLASS:   Class III INDICATIONS:colon polyp with high grade dysplasia MEDICATIONS: Fentanyl 50 mcg IV and Versed 4 mg IV  DESCRIPTION OF PROCEDURE:   After the risks benefits and alternatives of the procedure were thoroughly explained, informed consent was obtained.  Throughout the procedure, the patients blood pressure, pulse and oxygen saturations were monitored continuously. Under direct visualization, the     endoscope was introduced through the anus  and advanced to the descending colon      . Water was used as necessary to provide an acoustic interface. Imaging was obtained at 7.5 and 12Mhz. Upon completion of the imaging, water was removed and the patient was sent to the recovery room in satisfactory condition.    FINDINGS:   Polyp not palpable on digital rectal exam. Endoscopically, tattoo maring and polyp was seen proximal to the valves of [place].   Endoscopically, the lesion still appears to be in the distal sigmoid colon, with the understanding that surgical findings suggest otherwise.  The polyp was in a region of several twists and turns, and thus good en face views of the polyp were not possible.  There was a large hyperechoic round spot with anechoic spaces distal to the polyp, which appears most typical of the uterus.  I did not see any perirectal adenopathy.  STAGING: Distal colonic polyp, unable to obtain en face views for reasons as above.  ENDOSCOPIC IMPRESSION: Distal colonic polyp, unable to obtain en face views for reasons as above.  RECOMMENDATIONS: 1.  Watch for potential complications of procedure. 2.  If views of lesional wall are needed  pre-operatively, would consider MRI pelvis. 3.  Otherwise, would remove lesion in whatever operative fashion is deemed best.   _______________________________ eSigned:  Arta Silence, MD 11/21/2012 8:38 AM   CC:

## 2012-12-03 ENCOUNTER — Telehealth (INDEPENDENT_AMBULATORY_CARE_PROVIDER_SITE_OTHER): Payer: Self-pay | Admitting: General Surgery

## 2012-12-03 NOTE — Telephone Encounter (Signed)
Message copied by Margarette Asal on Mon Dec 03, 2012  1:58 PM ------      Message from: [place], [place]      Created: Fri Nov 30, 2012  5:40 PM       Lars Mage      She should have had rectal [place]. I haven't seen results and asked outlaw but haven't heard. Would you check on it. If it looks benign I want to have gross look at her for tem      MW ------

## 2012-12-03 NOTE — Telephone Encounter (Signed)
Patient returning call from our office.  Patient given appointment for 12/04/2012 @ 9:40 am w/Dr. Marcello Moores.  Patient advised to arrive 20 minutes prior to appt for registration.

## 2012-12-03 NOTE — Telephone Encounter (Signed)
Erica [name] talked to dr Marcello Moores. She will see her and talk about endoscopic resection. Will you call [name] and tell her and get her [name] with dr Marcello Moores ASAP please. Mw

## 2012-12-03 NOTE — Telephone Encounter (Signed)
I called patient to offer an appt for tomorrow or on 12/10/2012 with Dr Marcello Moores in one of her new patient ca spots. The evaluation is to discuss rectal polyp. Awaiting call back to make appt.

## 2012-12-04 ENCOUNTER — Ambulatory Visit (INDEPENDENT_AMBULATORY_CARE_PROVIDER_SITE_OTHER): Payer: Medicare Other | Admitting: General Surgery

## 2012-12-17 ENCOUNTER — Ambulatory Visit (INDEPENDENT_AMBULATORY_CARE_PROVIDER_SITE_OTHER): Payer: Medicare Other | Admitting: General Surgery

## 2012-12-17 ENCOUNTER — Encounter (INDEPENDENT_AMBULATORY_CARE_PROVIDER_SITE_OTHER): Payer: Self-pay | Admitting: General Surgery

## 2012-12-17 VITALS — BP 128/70 | HR 86 | Temp 98.0°F | Resp 18 | Ht 63.0 in | Wt 173.4 lb

## 2012-12-17 DIAGNOSIS — Z86010 Personal history of colonic polyps: Secondary | ICD-10-CM

## 2012-12-17 NOTE — Progress Notes (Signed)
Chief Complaint  Patient presents with  . New Evaluation    eval rectal polyps per Donne Hazel    HISTORY: Erica [name] is a 57 y.o. female who presents to the office with a rectal polyp.  This was found on colonoscopy and biopsy showed high grade dysplasia.  She was originally thought to have a sigmoid polyp but on laparoscopy this was seen to be more proximal rectum and well below sacral promentory.  Given the morbidity increase with an LAR, it was decided to abort the procedure.  Workup thus far has included a rectal [place] that was unable to be completed due to interference and CEA, which was WNL.  It is unclear if there is invasion or not.   Past Medical History  Diagnosis Date  . Diabetes mellitus   . Hypertension   . Mental disorder     depression  . Arthritis   . Depression   . Asthma   . GERD (gastroesophageal reflux disease)   . [id number](725.3)       Past Surgical History  Procedure Laterality Date  . Tonsillectomy and adenoidectomy    . Cesarean section    . Tonsillectomy    . Laparoscopic sigmoid colectomy N/A 11/14/2012    Procedure: DIAGNOSTIC LAPAROSCOPY AND SIGMOIDMOIDOSCOPY ;  Surgeon: Rolm Bookbinder, MD;  Location: [facility];  Service: General;  Laterality: N/A;  . Eus N/A 11/21/2012    Procedure: LOWER ENDOSCOPIC ULTRASOUND (EUS);  Surgeon: Arta Silence, MD;  Location: [facility];  Service: Endoscopy;  Laterality: N/A;  . Flexible sigmoidoscopy N/A 11/21/2012    Procedure: FLEXIBLE SIGMOIDOSCOPY;  Surgeon: Arta Silence, MD;  Location: [facility];  Service: Endoscopy;  Laterality: N/A;      Current Outpatient Prescriptions  Medication Sig Dispense Refill  . albuterol (PROVENTIL HFA;VENTOLIN HFA) 108 (90 BASE) MCG/ACT inhaler Inhale 2 puffs into the lungs every 4 (four) hours as needed for wheezing.      Marland Kitchen amLODipine (NORVASC) 10 MG tablet Take 10 mg by mouth every morning.       . baclofen (LIORESAL) 10 MG tablet Take 10 mg by mouth daily as needed (for  stomach).       . citalopram (CELEXA) 20 MG tablet Take 20 mg by mouth every morning.       . gabapentin (NEURONTIN) 300 MG capsule Take 300 mg by mouth 2 (two) times daily.       . hydrochlorothiazide (MICROZIDE) 12.5 MG capsule Take 12.5 mg by mouth every morning.      . metFORMIN (GLUCOPHAGE) 1000 MG tablet Take 1,000 mg by mouth 2 (two) times daily with a meal.      . pantoprazole (PROTONIX) 40 MG tablet Take 40 mg by mouth daily.      . traMADol (ULTRAM) 50 MG tablet Take 50 mg by mouth every 6 (six) hours as needed.        No current facility-administered medications for this visit.      Allergies  Allergen Reactions  . Penicillins Hives, Itching and Swelling    Tongue swells up      Family History  Problem Relation Age of Onset  . Hypertension Father   . Stroke Father   . Diabetes Father   . Heart disease Mother   . Hypertension Mother   . Hyperlipidemia Mother   . Hypertension Sister   . Hypertension Brother   . Hypertension Sister   . Hypertension Brother       History   Social History  .  Marital Status: Single    Spouse Name: N/A    Number of Children: N/A  . Years of Education: N/A   Social History Main Topics  . Smoking status: Current Every Day Smoker -- 0.25 packs/day for 20 years    Types: Cigarettes  . Smokeless tobacco: Never Used     Comment: one every 2-3 days  . Alcohol Use: 0.0 oz/week     Comment: 3 cans a week  . Drug Use: Yes    Special: "Crack" cocaine     Comment: last time was a year ago   . Sexually Active: No   Other Topics Concern  . None   Social History Narrative  . None       REVIEW OF SYSTEMS - PERTINENT POSITIVES ONLY: Review of Systems - General ROS: negative for - chills, fever or weight loss Hematological and Lymphatic ROS: negative for - bleeding problems or blood clots Respiratory ROS: no cough, shortness of breath, or wheezing Cardiovascular ROS: no chest pain or dyspnea on exertion Gastrointestinal ROS: no  abdominal pain, change in bowel habits, or black or bloody stools Genito-Urinary ROS: no dysuria, trouble voiding, or hematuria  EXAM: Filed Vitals:   12/17/12 1332  BP: 128/70  Pulse: 86  Temp: 98 F (36.7 C)  Resp: 18    Gen:  No acute distress.  Well nourished and well groomed.   Neurological: Alert and oriented to person, place, and time. Coordination normal.  Head: Normocephalic and atraumatic.  Eyes: Conjunctivae are normal. Pupils are equal, round, and reactive to light. No scleral icterus.  Neck: Normal range of motion. Neck supple. No tracheal deviation or thyromegaly present.  No cervical lymphadenopathy. Cardiovascular: Normal rate, regular rhythm Respiratory: Effort normal.  No respiratory distress. No chest wall tenderness. Breath sounds normal.  No wheezes, rales or rhonchi.  GI: Soft. Bowel sounds are normal. The abdomen is soft and nontender.  There is no rebound and no guarding.  Musculoskeletal: Normal range of motion. Extremities are nontender.  Skin: Skin is warm and dry. No rash noted. No diaphoresis. No erythema. No pallor. No clubbing, cyanosis, or edema.   Psychiatric: Normal mood and affect. Behavior is normal. Judgment and thought content normal.   Anorectal Exam: deferred due to previous my endoscopic exam in OR which showed tumor in proximal rectum in left lateral position   LABORATORY RESULTS: Available labs are reviewed  Lab Results  Component Value Date   WBC 6.5 11/14/2012   HGB 9.1* 11/14/2012   HCT 24.5* 11/14/2012   MCV 91.1 11/14/2012   PLT 149* 11/14/2012    Lab Results  Component Value Date   CEA 3.8 11/06/2012     ASSESSMENT AND PLAN: Erica [name] is a 57 y.o. F with a concerning rectal polyp.  We had a long discussion regarding her treatment.  We discussed the risks associated with LAR and endoscopic resection (recurrence, bleeding, perforation).  She would like to avoid a rectal resection unless absolutely necessary.  Given this information,  I have suggested that we perform a sigmoidoscopy and attempt resection.  If we are able to get this to lift up, then we will proceed with this plan.  If it doesn't lift off the submucosa, I will assume cancer is present and plan on an LAR the following day, since she will already be prepped.  We discussed both procedures in detail, and she has agreed to proceed.   The surgery and anatomy were described to the patient as  well as the risks of surgery and the possible complications.  These include: Bleeding, infection and possible wound complications such as hernia, damage to adjacent structures, leak of surgical connections, which can lead to other surgeries and possibly an ostomy (5-7%), possible need for other procedures, such as abscess drains in radiology, possible prolonged hospital stay, possible diarrhea from removal of part of the colon, possible constipation from narcotics, prolonged fatigue/weakness or appetite loss, possible early recurrence of cancer, possible complications of their medical problems such as heart disease or arrhythmias or lung problems, death (less than 1%). I believe the patient understands and wishes to proceed with the surgery.   Rosario Adie, MD Colon and Rectal Surgery / [facility]      Visit Diagnoses: 1. Personal history of colonic polyps     Primary Care Physician: Philis Fendt, MD

## 2012-12-17 NOTE — Patient Instructions (Signed)
[facility]  ONE-DAY (1) PRE-OP HOME COLON PREP INSTRUCTIONS: ** MIRALAX / GATORADE PREP **  You must follow the instructions below carefully.  If you have questions or problems, please call and speak to someone in the clinic department at our office:   [phone].     INSTRUCTIONS: 1. Five days prior to your procedure do not eat nuts, popcorn, or fruit with seeds.  Stop all fiber supplements such as Metamucil, Citrucel, etc. 2. Two days before surgery fill the prescription at a pharmacy of your choice and purchase the additional supplies below.         MIRALAX - GATORADE -- DULCOLAX TABS:   Purchase a bottle of MIRALAX  (255 gm bottle)    In addition, purchase four (4) DULCOLAX TABLETS (no prescription required- ask the pharmacist if you can't find them)    Purchase one 64 oz GATORADE.  (Do NOT purchase red Gatorade; any other flavor is acceptable) and place in refrigerator to get cold.  3.   Day Before Surgery:   6 am: take the 4 Dulcolax tablets   You may only have clear liquids (tea, coffee, juice, broth, jello, soft drinks, gummy bears).  You cannot have solid foods, cream, milk or milk products.  Drink at lease 8 ounces of liquids every hour while awake.   Mix the entire bottle of MiraLax and the Gatorade in a large container.    10:00am: Begin drinking the Gatorade mixture until gone (8 oz every 15-30 minutes).      You may suck on a lime wedge or hard candy to "freshen your palate" in between glasses   If you are a diabetic, take your blood sugar reading several time throughout the prep.  Have some juice available to take if your sugar level gets too low   You may feel chilled while taking the prep.  Have some warm tea or broth to help warm up.   Continue clear liquids until midnight or bedtime  3. The day of your procedure:   Do not eat or drink ANYTHING after midnight before your surgery.     If you take Heart or Blood Pressure medicine, ask the pre-op nurses about  these during your preop appointment.   Further pre-operative instructions will be given to you from the hospital.   Expect to be contacted 5-7 days before your surgery.

## 2012-12-28 ENCOUNTER — Telehealth (INDEPENDENT_AMBULATORY_CARE_PROVIDER_SITE_OTHER): Payer: Self-pay | Admitting: *Deleted

## 2012-12-28 NOTE — Telephone Encounter (Signed)
Patient called in to report that she has begun seeing bright red blood in the commode following most of her BM's.  Patient states her BMs have been more on the loose side rather than hard.  Patient denies any pain or other symptoms.  Instructed patient that it could just be one of her polyps that has a small acute bleed following a BM however this message will be sent on to Dr. Marcello Moores for her opinion.  Explained that if it is a polyp then there really isn't anything to do for it but to just watch it and call back if bleeding becomes moderate to severe.

## 2012-12-28 NOTE — Telephone Encounter (Signed)
Could be from polyp, could be hemorrhoid or fissure.  Ok as long as it is a Forensic psychologist.  She should be concerned if she begins to pass large amts of blood or blood clots.  Otherwise we will evaluate at time of scope

## 2012-12-28 NOTE — Telephone Encounter (Signed)
I notified the pt of Dr Marcello Moores' advice.  She understands.

## 2013-01-15 ENCOUNTER — Encounter (HOSPITAL_COMMUNITY): Payer: Self-pay | Admitting: Pharmacy Technician

## 2013-01-21 NOTE — Progress Notes (Signed)
Dr. Marcello Moores - Erica [name] is coming to [facility] tomorrow  8/12 at 10:00 am for her preop - her surgery is on 8/19.  Please enter her preop orders in Epic for the surgery.   Thanks

## 2013-01-22 ENCOUNTER — Inpatient Hospital Stay (HOSPITAL_COMMUNITY)
Admission: RE | Admit: 2013-01-22 | Discharge: 2013-01-22 | Disposition: A | Discharge status: Home / Self Care | Payer: Medicare Other | Source: Ambulatory Visit

## 2013-01-23 ENCOUNTER — Encounter (HOSPITAL_COMMUNITY)
Admission: RE | Admit: 2013-01-23 | Discharge: 2013-01-23 | Disposition: A | Discharge status: Home / Self Care | Payer: Medicare Other | Source: Ambulatory Visit | Attending: General Surgery | Admitting: General Surgery

## 2013-01-23 ENCOUNTER — Telehealth (INDEPENDENT_AMBULATORY_CARE_PROVIDER_SITE_OTHER): Payer: Self-pay

## 2013-01-23 ENCOUNTER — Encounter (HOSPITAL_COMMUNITY): Payer: Self-pay

## 2013-01-23 DIAGNOSIS — K621 Rectal polyp: Secondary | ICD-10-CM | POA: Insufficient documentation

## 2013-01-23 DIAGNOSIS — K620 Anal polyp: Secondary | ICD-10-CM | POA: Insufficient documentation

## 2013-01-23 DIAGNOSIS — Z01812 Encounter for preprocedural laboratory examination: Secondary | ICD-10-CM | POA: Insufficient documentation

## 2013-01-23 DIAGNOSIS — Z0183 Encounter for blood typing: Secondary | ICD-10-CM | POA: Insufficient documentation

## 2013-01-23 HISTORY — DX: Polyneuropathy, unspecified: G62.9

## 2013-01-23 HISTORY — DX: Rectal polyp: K62.1

## 2013-01-23 HISTORY — DX: Pain, unspecified: R52

## 2013-01-23 LAB — BASIC METABOLIC PANEL
BUN: 24 mg/dL — ABNORMAL HIGH (ref 6–23)
CO2: 23 mEq/L (ref 19–32)
Calcium: 9.3 mg/dL (ref 8.4–10.5)
Chloride: 105 mEq/L (ref 96–112)
Creatinine, Ser: 1.12 mg/dL — ABNORMAL HIGH (ref 0.50–1.10)
GFR calc Af Amer: 62 mL/min — ABNORMAL LOW (ref >90)
GFR calc non Af Amer: 53 mL/min — ABNORMAL LOW (ref >90)
Glucose, Bld: 150 mg/dL — ABNORMAL HIGH (ref 70–99)
Potassium: 3.8 mEq/L (ref 3.5–5.1)
Sodium: 138 mEq/L (ref 135–145)

## 2013-01-23 LAB — CBC
HCT: 33.4 % — ABNORMAL LOW (ref 36.0–46.0)
Hemoglobin: 12.3 g/dL (ref 12.0–15.0)
MCH: 33.8 pg (ref 26.0–34.0)
MCHC: 36.8 g/dL — ABNORMAL HIGH (ref 30.0–36.0)
MCV: 91.8 fL (ref 78.0–100.0)
Platelets: 208 10*3/uL (ref 150–400)
RBC: 3.64 MIL/uL — ABNORMAL LOW (ref 3.87–5.11)
RDW: 14.9 % (ref 11.5–15.5)
WBC: 3.9 10*3/uL — ABNORMAL LOW (ref 4.0–10.5)

## 2013-01-23 NOTE — Telephone Encounter (Signed)
Late Entry --  Pat @ [facility] calling in to the office requesting a copy of Pre-Op Bowel Prep.  Faxed bowel prep to [phone] attn:  Fraser Din.  Fax confirmation rec'd.

## 2013-01-23 NOTE — Patient Instructions (Signed)
YOUR SURGERY IS SCHEDULED AT [facility]  ON:  Tuesday 8/19  REPORT TO [facility] AT:  5:30 AM      PHONE # FOR SHORT STAY IS [phone]  DO NOT EAT OR DRINK ANYTHING AFTER MIDNIGHT THE NIGHT BEFORE YOUR SURGERY.  YOU MAY BRUSH YOUR TEETH, RINSE OUT YOUR MOUTH--BUT NO WATER, NO FOOD, NO CHEWING GUM, NO MINTS, NO CANDIES, NO CHEWING TOBACCO.  PLEASE TAKE THE FOLLOWING MEDICATIONS THE AM OF YOUR SURGERY WITH A FEW SIPS OF WATER:  AMLODIPINE, CITALOPRAM, GABAPENTIN, PANTOPRAZOLE, TRAMADOL IF NEEDED FOR PAIN.   USE YOUR ALBUTEROL INHALER.  IF YOU USE INHALERS--USE YOUR INHALERS THE AM OF YOUR SURGERY AND BRING INHALERS TO [place].    IF YOU ARE DIABETIC:  DO NOT TAKE ANY DIABETIC MEDICATIONS THE AM OF YOUR SURGERY.  IF YOU TAKE INSULIN IN THE EVENINGS--PLEASE ONLY TAKE 1/2 NORMAL EVENING DOSE THE NIGHT BEFORE YOUR SURGERY.  NO INSULIN THE AM OF YOUR SURGERY.   DO NOT BRING VALUABLES, MONEY, CREDIT CARDS.  DO NOT WEAR JEWELRY, MAKE-UP, NAIL POLISH AND NO METAL PINS OR CLIPS IN YOUR HAIR. CONTACT LENS, DENTURES / PARTIALS, GLASSES SHOULD NOT BE WORN TO SURGERY AND IN MOST CASES-HEARING AIDS WILL NEED TO BE REMOVED.  BRING YOUR GLASSES CASE, ANY EQUIPMENT NEEDED FOR YOUR CONTACT LENS. FOR PATIENTS ADMITTED TO THE HOSPITAL--CHECK OUT TIME THE DAY OF DISCHARGE IS 11:00 AM.  ALL INPATIENT ROOMS ARE PRIVATE - WITH BATHROOM, TELEPHONE, TELEVISION AND WIFI INTERNET.                                PLEASE READ OVER ANY  FACT SHEETS THAT YOU WERE GIVEN:  BLOOD TRANSFUSION INFORMATION FAILURE TO FOLLOW THESE INSTRUCTIONS MAY RESULT IN THE CANCELLATION OF YOUR SURGERY.   PATIENT SIGNATURE_________________________________

## 2013-01-23 NOTE — Pre-Procedure Instructions (Signed)
PT'S PREOP BUN AND CREATININE ABNORMAL - REVIEWED IN EPIC BY DR. Marcello Moores.

## 2013-01-23 NOTE — Pre-Procedure Instructions (Signed)
NO PREOP ORDERS IN EPIC FOR DAY OF SURGERY.  LABS CBC, BMET, T/S WERE DONE TODAY AT PREOP VISIT AS PER ANESTHESIOLOGIST'S GUIDELINES. CXR AND EKG REPORTS ARE IN EPIC FROM 11/06/12.  PT STATED NOT SURE IF SHE HAS HER BOWEL PREP INSTRUCTIONS - DR. THOMAS'S OFFICE FAXED A COPY OF ONE DAY MIRALAX PREP THAT I GAVE TO PT AND PT STATES SHE WILL CALL OFFICE IF ANY QUESTIONS ABOUT THE PREP.

## 2013-01-28 ENCOUNTER — Other Ambulatory Visit (INDEPENDENT_AMBULATORY_CARE_PROVIDER_SITE_OTHER): Payer: Self-pay | Admitting: General Surgery

## 2013-01-28 ENCOUNTER — Ambulatory Visit (HOSPITAL_COMMUNITY)
Admission: RE | Admit: 2013-01-28 | Discharge: 2013-01-28 | Disposition: A | Discharge status: Home / Self Care | Payer: Medicare Other | Source: Ambulatory Visit | Attending: General Surgery | Admitting: General Surgery

## 2013-01-28 ENCOUNTER — Encounter (HOSPITAL_COMMUNITY)
Admission: RE | Disposition: A | Discharge status: Home / Self Care | Payer: Self-pay | Source: Ambulatory Visit | Attending: General Surgery

## 2013-01-28 ENCOUNTER — Encounter (HOSPITAL_COMMUNITY): Payer: Self-pay | Admitting: *Deleted

## 2013-01-28 DIAGNOSIS — C19 Malignant neoplasm of rectosigmoid junction: Secondary | ICD-10-CM

## 2013-01-28 DIAGNOSIS — E119 Type 2 diabetes mellitus without complications: Secondary | ICD-10-CM | POA: Insufficient documentation

## 2013-01-28 DIAGNOSIS — J45909 Unspecified asthma, uncomplicated: Secondary | ICD-10-CM | POA: Insufficient documentation

## 2013-01-28 DIAGNOSIS — K219 Gastro-esophageal reflux disease without esophagitis: Secondary | ICD-10-CM | POA: Insufficient documentation

## 2013-01-28 DIAGNOSIS — D128 Benign neoplasm of rectum: Secondary | ICD-10-CM | POA: Insufficient documentation

## 2013-01-28 DIAGNOSIS — Z79899 Other long term (current) drug therapy: Secondary | ICD-10-CM | POA: Insufficient documentation

## 2013-01-28 DIAGNOSIS — I10 Essential (primary) hypertension: Secondary | ICD-10-CM | POA: Insufficient documentation

## 2013-01-28 HISTORY — PX: FLEXIBLE SIGMOIDOSCOPY: SHX5431

## 2013-01-28 SURGERY — SIGMOIDOSCOPY, FLEXIBLE
Anesthesia: Moderate Sedation

## 2013-01-28 MED ORDER — MIDAZOLAM HCL 10 MG/2ML IJ SOLN
INTRAMUSCULAR | Status: AC
  Filled 2013-01-28: qty 4

## 2013-01-28 MED ORDER — SPOT INK MARKER SYRINGE KIT
PACK | SUBMUCOSAL | Status: AC
  Filled 2013-01-28: qty 5

## 2013-01-28 MED ORDER — FENTANYL CITRATE 0.05 MG/ML IJ SOLN
INTRAMUSCULAR | Status: DC | PRN
  Administered 2013-01-28 (×2): 25 ug via INTRAVENOUS

## 2013-01-28 MED ORDER — SODIUM CHLORIDE 0.9 % IV SOLN
INTRAVENOUS | Status: DC
  Administered 2013-01-28: 500 mL via INTRAVENOUS

## 2013-01-28 MED ORDER — GENTAMICIN SULFATE 40 MG/ML IJ SOLN
300.0000 mg | INTRAVENOUS | Status: AC
  Administered 2013-01-29: 300 mg via INTRAVENOUS
  Filled 2013-01-28: qty 7.5

## 2013-01-28 MED ORDER — CLINDAMYCIN PHOSPHATE 900 MG/50ML IV SOLN
900.0000 mg | INTRAVENOUS | Status: AC
  Administered 2013-01-29: 900 mg via INTRAVENOUS
  Filled 2013-01-28: qty 50

## 2013-01-28 MED ORDER — EPINEPHRINE HCL 0.1 MG/ML IJ SOSY
PREFILLED_SYRINGE | INTRAMUSCULAR | Status: AC
  Filled 2013-01-28: qty 10

## 2013-01-28 MED ORDER — FENTANYL CITRATE 0.05 MG/ML IJ SOLN
INTRAMUSCULAR | Status: AC
  Filled 2013-01-28: qty 4

## 2013-01-28 MED ORDER — MIDAZOLAM HCL 10 MG/2ML IJ SOLN
INTRAMUSCULAR | Status: DC | PRN
  Administered 2013-01-28: 2 mg via INTRAVENOUS
  Administered 2013-01-28: 1 mg via INTRAVENOUS
  Administered 2013-01-28: 2 mg via INTRAVENOUS

## 2013-01-28 MED ORDER — DIPHENHYDRAMINE HCL 50 MG/ML IJ SOLN
INTRAMUSCULAR | Status: AC
  Filled 2013-01-28: qty 1

## 2013-01-28 NOTE — H&P (Signed)
Chief Complaint   Patient presents with   .  New Evaluation     eval rectal polyps per Donne Hazel   HISTORY:  Erica [name] is a 57 y.o. female who presents to the office with a rectal polyp. This was found on colonoscopy and biopsy showed high grade dysplasia. She was originally thought to have a sigmoid polyp but on laparoscopy this was seen to be more proximal rectum and well below sacral promentory. Given the morbidity increase with an LAR, it was decided to abort the procedure. Workup thus far has included a rectal [place] that was unable to be completed due to interference and CEA, which was WNL. It is unclear if there is invasion or not.  Past Medical History   Diagnosis  Date   .  Diabetes mellitus    .  Hypertension    .  Mental disorder      depression   .  Arthritis    .  Depression    .  Asthma    .  GERD (gastroesophageal reflux disease)    .  ZOXWRUEA(540.9)     Past Surgical History   Procedure  Laterality  Date   .  Tonsillectomy and adenoidectomy     .  Cesarean section     .  Tonsillectomy     .  Laparoscopic sigmoid colectomy  N/A  11/14/2012     Procedure: DIAGNOSTIC LAPAROSCOPY AND SIGMOIDMOIDOSCOPY ; Surgeon: Rolm Bookbinder, MD; Location: [facility]; Service: General; Laterality: N/A;   .  Eus  N/A  11/21/2012     Procedure: LOWER ENDOSCOPIC ULTRASOUND (EUS); Surgeon: Arta Silence, MD; Location: [facility]; Service: Endoscopy; Laterality: N/A;   .  Flexible sigmoidoscopy  N/A  11/21/2012     Procedure: FLEXIBLE SIGMOIDOSCOPY; Surgeon: Arta Silence, MD; Location: [facility]; Service: Endoscopy; Laterality: N/A;    Current Outpatient Prescriptions   Medication  Sig  Dispense  Refill   .  albuterol (PROVENTIL HFA;VENTOLIN HFA) 108 (90 BASE) MCG/ACT inhaler  Inhale 2 puffs into the lungs every 4 (four) hours as needed for wheezing.     Marland Kitchen  amLODipine (NORVASC) 10 MG tablet  Take 10 mg by mouth every morning.     .  baclofen (LIORESAL) 10 MG tablet  Take 10 mg by mouth  daily as needed (for stomach).     .  citalopram (CELEXA) 20 MG tablet  Take 20 mg by mouth every morning.     .  gabapentin (NEURONTIN) 300 MG capsule  Take 300 mg by mouth 2 (two) times daily.     .  hydrochlorothiazide (MICROZIDE) 12.5 MG capsule  Take 12.5 mg by mouth every morning.     .  metFORMIN (GLUCOPHAGE) 1000 MG tablet  Take 1,000 mg by mouth 2 (two) times daily with a meal.     .  pantoprazole (PROTONIX) 40 MG tablet  Take 40 mg by mouth daily.     .  traMADol (ULTRAM) 50 MG tablet  Take 50 mg by mouth every 6 (six) hours as needed.      No current facility-administered medications for this visit.    Allergies   Allergen  Reactions   .  Penicillins  Hives, Itching and Swelling     Tongue swells up    Family History   Problem  Relation  Age of Onset   .  Hypertension  Father    .  Stroke  Father    .  Diabetes  Father    .  Heart disease  Mother    .  Hypertension  Mother    .  Hyperlipidemia  Mother    .  Hypertension  Sister    .  Hypertension  Brother    .  Hypertension  Sister    .  Hypertension  Brother     History    Social History   .  Marital Status:  Single     Spouse Name:  N/A     Number of Children:  N/A   .  Years of Education:  N/A    Social History Main Topics   .  Smoking status:  Current Every Day Smoker -- 0.25 packs/day for 20 years     Types:  Cigarettes   .  Smokeless tobacco:  Never Used      Comment: one every 2-3 days   .  Alcohol Use:  0.0 oz/week      Comment: 3 cans a week   .  Drug Use:  Yes     Special:  "Crack" cocaine      Comment: last time was a year ago   .  Sexually Active:  No    Other Topics  Concern   .  None    Social History Narrative   .  None   REVIEW OF SYSTEMS - PERTINENT POSITIVES ONLY:  Review of Systems - General ROS: negative for - chills, fever or weight loss  Hematological and Lymphatic ROS: negative for - bleeding problems or blood clots  Respiratory ROS: no cough, shortness of breath, or wheezing   Cardiovascular ROS: no chest pain or dyspnea on exertion  Gastrointestinal ROS: no abdominal pain, change in bowel habits, or black or bloody stools  Genito-Urinary ROS: no dysuria, trouble voiding, or hematuria  EXAM:  Filed Vitals:   01/28/13 0659  BP: 146/94  Temp: 98 F (36.7 C)  Resp: 12   Gen: No acute distress. Well nourished and well groomed.  Neurological: Alert and oriented to person, place, and time. Coordination normal.  Head: Normocephalic and atraumatic.  Eyes: Conjunctivae are normal. Pupils are equal, round, and reactive to light. No scleral icterus.  Neck: Normal range of motion. Neck supple. No tracheal deviation or thyromegaly present. No cervical lymphadenopathy.  Cardiovascular: Normal rate, regular rhythm  Respiratory: Effort normal. No respiratory distress. No chest wall tenderness. Breath sounds normal. No wheezes, rales or rhonchi.  GI: Soft. Bowel sounds are normal. The abdomen is soft and nontender. There is no rebound and no guarding.  Musculoskeletal: Normal range of motion. Extremities are nontender.  Skin: Skin is warm and dry. No rash noted. No diaphoresis. No erythema. No pallor. No clubbing, cyanosis, or edema.  Psychiatric: Normal mood and affect. Behavior is normal. Judgment and thought content normal.  Anorectal Exam: deferred due to previous my endoscopic exam in OR which showed tumor in proximal rectum in left lateral position  LABORATORY RESULTS:  Available labs are reviewed  Lab Results   Component  Value  Date    WBC  6.5  11/14/2012    HGB  9.1*  11/14/2012    HCT  24.5*  11/14/2012    MCV  91.1  11/14/2012    PLT  149*  11/14/2012    Lab Results   Component  Value  Date    CEA  3.8  11/06/2012   ASSESSMENT AND PLAN:  Erica [name] is a 57 y.o. F with a concerning  rectal polyp. We had a long discussion regarding her treatment. We discussed the risks associated with LAR and endoscopic resection (recurrence, bleeding, perforation). She would like to  avoid a rectal resection unless absolutely necessary. Given this information, I have suggested that we perform a sigmoidoscopy and attempt resection. If we are able to get this to lift up, then we will proceed with this plan. If it doesn't lift off the submucosa, I will assume cancer is present and plan on an LAR the following day, since she will already be prepped. We discussed both procedures in detail, and she has agreed to proceed.  The surgery and anatomy were described to the patient as well as the risks of surgery and the possible complications.  These include: Bleeding, infection and possible wound complications such as hernia, damage to adjacent structures, leak of surgical connections, which can lead to other surgeries and possibly an ostomy (5-7%), possible need for other procedures, such as abscess drains in radiology, possible prolonged hospital stay, possible diarrhea from removal of part of the colon, possible constipation from narcotics, prolonged fatigue/weakness or appetite loss, possible early recurrence of cancer, possible complications of their medical problems such as heart disease or arrhythmias or lung problems, death (less than 1%).  I believe the patient understands and wishes to proceed with the surgery.  Rosario Adie, MD  Colon and Rectal Surgery / [facility]

## 2013-01-28 NOTE — Discharge Instructions (Signed)
Sedation or General Anesthesia, Adult Care After Refer to this sheet in the next 24 hours. These instructions provide you with information on caring for yourself after your procedure. Your caregiver may also give you more specific instructions. Your treatment has been planned according to current medical practices, but problems sometimes occur. Call your caregiver if you have any problems or questions after your procedure.  HOME CARE INSTRUCTIONS   Do not participate in any activities that require you to be alert or coordinated. Do not:  Drive.  Swim.  Ride a bicycle.  Operate heavy machinery.  Cook.  Use power tools.  Climb ladders.  Work at [employer].  Take a bath.  Do not drink alcohol.  Do not make any important decisions or sign legal documents.  Stay with an adult.  The first meal following your procedure should be light and small. Avoid solid foods if you feel sick to your stomach (nauseous) or if you throw up (vomit).  Drink enough fluids to keep your urine clear or pale yellow.  Only take your usual medicines or new medicines if your caregiver approves them.  Only take over-the-counter or prescription medicines for pain, discomfort, or fever as directed by your caregiver.  Keep all follow-up appointments as directed by your caregiver. SEEK IMMEDIATE MEDICAL CARE IF:   You are not feeling normal or behaving normally after 24 hours.  You have persistent nausea and vomiting.  You are unable to drink fluids or eat food.  You have difficulty urinating.  You have difficulty breathing or speaking.  You have blue or gray skin.  There is difficulty waking or you cannot be woken up.  You have heavy bleeding, redness, or a lot of swelling where the sedative or anesthesia entered your skin (intravenous site).  You have a rash. MAKE SURE YOU:  Understand these instructions.  Will watch your condition.  Will get help right away if you are not doing well or  get worse. Document Released: 05/30/2005 Document Revised: 11/29/2011 Document Reviewed: 09/28/2011 [facility] Patient Information 2014 [place], [place].   Post Colonoscopy Instructions  1. DIET: FClear liquids only today.  Nothing by mouth after midnight.  2. You may have some mild rectal bleeding for the first few days after the procedure.  This should get less and less with time.  Resume any blood thinners 2 days after your procedure unless directed otherwise by your physician. 3. Take your usually prescribed home medications unless otherwise directed. a. If you have any pain, it is helpful to get up and walk around, as it is usually from excess gas. b. If this is not helpful, you can take an over-the-counter pain medication.  Choose one of the following that works best for you: i. Naproxen (Aleve, etc)  Two 29m tabs twice a day ii. Ibuprofen (Advil, etc) Three 2053mtabs four times a day (every meal & bedtime) iii. If you still have pain after using one of these, please call the office 4. It is normal to not have a bowel movement for 2-3 days after colonoscopy.    5. ACTIVITIES as tolerated:   6. You may resume regular (light) daily activities beginning the next day--such as daily self-care, walking, climbing stairs--gradually increasing activities as tolerated.    WHEN TO CALL [id number]. Fever over 101.5 F (38.5 C)  2. Severe abdominal or chest pain  3. Large amount of rectal bleeding, passing multiple blood clots  4. Dizziness or shortness of breath 5. Increasing nausea  or vomiting   The clinic staff is available to answer your questions during regular business hours (8:30am-5pm).  Please don't hesitate to call and ask to speak to one of our nurses for clinical concerns.   If you have a medical emergency, go to the nearest emergency room or call 911.  A surgeon from [facility] is always on call at the [facility], [place], [place], [place], [place]  [address] ? MAIN: [phone] ? TOLL FREE: [phone] ?  FAX [phone] [email or web address]

## 2013-01-28 NOTE — Op Note (Signed)
[facility] [place], [address]   FLEXIBLE SIGMOIDOSCOPY PROCEDURE REPORT  PATIENT: Erica [name], Erica [name]  MR#: [id number] BIRTHDATE: [birth year] , 41  yrs. old GENDER: Female ENDOSCOPIST: Rosario Adie, MD REFERRED BY: PROCEDURE DATE:  01/28/2013 PROCEDURE:   Sigmoidoscopy, diagnostic and Proctosigmoidoscopy, diagnostoc ASA CLASS:   Class III INDICATIONS:follow up of rectal adenomatous polyp(s). MEDICATIONS: Fentanyl 50 mcg IV and Versed 5 mg IV  DESCRIPTION OF PROCEDURE:   After the risks benefits and alternatives of the procedure were thoroughly explained, informed consent was obtained.  revealed no abnormalities of the rectum. The [id number] ([id number])  endoscope was introduced through the anus  and advanced to the sigmoid colon , limited by No adverse events experienced.   The quality of the prep was adequate .  The instrument was then slowly withdrawn as the mucosa was fully examined.         COLON FINDINGS: A flat polyp was found in the rectum.  A 1:10,000 Epinephrine solution and saline Injection was given to lift the mucosal wall.  Injection therapy was attempted but not successful. Polyp did not lift well.  There is concern for invasive component. Distal margin injected with SPOT. Retroflexion was not performed.    The scope was then withdrawn from the patient and the procedure terminated.  COMPLICATIONS: There were no complications.  ENDOSCOPIC IMPRESSION: Flat polyp was found in the rectum  RECOMMENDATIONS: surgery  [  _______________________________ eSigned:  Rosario Adie, MD 88/75/7972 8:10 AM   CC:

## 2013-01-29 ENCOUNTER — Encounter (HOSPITAL_COMMUNITY)
Admission: RE | Disposition: A | Discharge status: Home Health | Payer: Self-pay | Source: Ambulatory Visit | Attending: General Surgery

## 2013-01-29 ENCOUNTER — Inpatient Hospital Stay (HOSPITAL_COMMUNITY): Payer: Medicare Other | Admitting: Anesthesiology

## 2013-01-29 ENCOUNTER — Inpatient Hospital Stay (HOSPITAL_COMMUNITY)
Admission: RE | Admit: 2013-01-29 | Discharge: 2013-02-08 | DRG: 333 | Disposition: A | Discharge status: Home Health | Payer: Medicare Other | Source: Ambulatory Visit | Attending: General Surgery | Admitting: General Surgery

## 2013-01-29 ENCOUNTER — Encounter (HOSPITAL_COMMUNITY): Payer: Self-pay | Admitting: *Deleted

## 2013-01-29 ENCOUNTER — Encounter (HOSPITAL_COMMUNITY): Payer: Self-pay | Admitting: Anesthesiology

## 2013-01-29 DIAGNOSIS — Y838 Other surgical procedures as the cause of abnormal reaction of the patient, or of later complication, without mention of misadventure at the time of the procedure: Secondary | ICD-10-CM | POA: Diagnosis not present

## 2013-01-29 DIAGNOSIS — F17200 Nicotine dependence, unspecified, uncomplicated: Secondary | ICD-10-CM | POA: Diagnosis present

## 2013-01-29 DIAGNOSIS — Z79899 Other long term (current) drug therapy: Secondary | ICD-10-CM

## 2013-01-29 DIAGNOSIS — C19 Malignant neoplasm of rectosigmoid junction: Principal | ICD-10-CM | POA: Diagnosis present

## 2013-01-29 DIAGNOSIS — I10 Essential (primary) hypertension: Secondary | ICD-10-CM | POA: Diagnosis present

## 2013-01-29 DIAGNOSIS — K560 Paralytic ileus: Secondary | ICD-10-CM | POA: Diagnosis not present

## 2013-01-29 DIAGNOSIS — T8140XA Infection following a procedure, unspecified, initial encounter: Secondary | ICD-10-CM | POA: Diagnosis not present

## 2013-01-29 DIAGNOSIS — Z01812 Encounter for preprocedural laboratory examination: Secondary | ICD-10-CM

## 2013-01-29 DIAGNOSIS — F3289 Other specified depressive episodes: Secondary | ICD-10-CM | POA: Diagnosis present

## 2013-01-29 DIAGNOSIS — Z791 Long term (current) use of non-steroidal anti-inflammatories (NSAID): Secondary | ICD-10-CM

## 2013-01-29 DIAGNOSIS — C20 Malignant neoplasm of rectum: Secondary | ICD-10-CM | POA: Insufficient documentation

## 2013-01-29 DIAGNOSIS — F329 Major depressive disorder, single episode, unspecified: Secondary | ICD-10-CM | POA: Diagnosis present

## 2013-01-29 DIAGNOSIS — N289 Disorder of kidney and ureter, unspecified: Secondary | ICD-10-CM | POA: Diagnosis not present

## 2013-01-29 DIAGNOSIS — Y9210 Unspecified residential institution as the place of occurrence of the external cause: Secondary | ICD-10-CM | POA: Diagnosis not present

## 2013-01-29 DIAGNOSIS — E119 Type 2 diabetes mellitus without complications: Secondary | ICD-10-CM | POA: Diagnosis present

## 2013-01-29 DIAGNOSIS — K5900 Constipation, unspecified: Secondary | ICD-10-CM | POA: Diagnosis not present

## 2013-01-29 DIAGNOSIS — E871 Hypo-osmolality and hyponatremia: Secondary | ICD-10-CM | POA: Diagnosis not present

## 2013-01-29 DIAGNOSIS — K219 Gastro-esophageal reflux disease without esophagitis: Secondary | ICD-10-CM | POA: Diagnosis present

## 2013-01-29 HISTORY — PX: LAPAROSCOPIC LOW ANTERIOR RESECTION: SHX5904

## 2013-01-29 LAB — TYPE AND SCREEN
ABO/RH(D): O POS
Antibody Screen: NEGATIVE

## 2013-01-29 LAB — GLUCOSE, CAPILLARY
Glucose-Capillary: 144 mg/dL — ABNORMAL HIGH (ref 70–99)
Glucose-Capillary: 211 mg/dL — ABNORMAL HIGH (ref 70–99)
Glucose-Capillary: 153 mg/dL — ABNORMAL HIGH (ref 70–99)
Glucose-Capillary: 128 mg/dL — ABNORMAL HIGH (ref 70–99)
Glucose-Capillary: 123 mg/dL — ABNORMAL HIGH (ref 70–99)

## 2013-01-29 SURGERY — RESECTION, RECTUM, LOW ANTERIOR, LAPAROSCOPIC
Anesthesia: General | Site: Abdomen | Wound class: Contaminated

## 2013-01-29 MED ORDER — PROPOFOL 10 MG/ML IV BOLUS
INTRAVENOUS | Status: DC | PRN
  Administered 2013-01-29: 120 mg via INTRAVENOUS

## 2013-01-29 MED ORDER — INSULIN ASPART 100 UNIT/ML ~~LOC~~ SOLN
0.0000 [IU] | SUBCUTANEOUS | Status: DC
  Administered 2013-01-29: 2 [IU] via SUBCUTANEOUS
  Administered 2013-01-29: 3 [IU] via SUBCUTANEOUS
  Administered 2013-01-30: 2 [IU] via SUBCUTANEOUS
  Administered 2013-01-30: 08:00:00 via SUBCUTANEOUS
  Administered 2013-01-30 – 2013-01-31 (×6): 3 [IU] via SUBCUTANEOUS
  Administered 2013-01-31: 5 [IU] via SUBCUTANEOUS
  Administered 2013-01-31 – 2013-02-01 (×3): 3 [IU] via SUBCUTANEOUS
  Administered 2013-02-01: 2 [IU] via SUBCUTANEOUS
  Administered 2013-02-01 (×4): 3 [IU] via SUBCUTANEOUS
  Administered 2013-02-02: 2 [IU] via SUBCUTANEOUS
  Administered 2013-02-02 (×2): 3 [IU] via SUBCUTANEOUS
  Administered 2013-02-02: 2 [IU] via SUBCUTANEOUS

## 2013-01-29 MED ORDER — SODIUM CHLORIDE 0.9 % IJ SOLN: 9.0000 mL | INTRAMUSCULAR | Status: DC | PRN

## 2013-01-29 MED ORDER — LACTATED RINGERS IV SOLN
INTRAVENOUS | Status: DC
  Administered 2013-01-29: 14:00:00 via INTRAVENOUS

## 2013-01-29 MED ORDER — GLYCOPYRROLATE 0.2 MG/ML IJ SOLN
INTRAMUSCULAR | Status: DC | PRN
  Administered 2013-01-29: .8 mg via INTRAVENOUS

## 2013-01-29 MED ORDER — MORPHINE SULFATE (PF) 1 MG/ML IV SOLN
INTRAVENOUS | Status: DC
  Administered 2013-01-29: 12:00:00 via INTRAVENOUS
  Administered 2013-01-29: 3 mg via INTRAVENOUS
  Administered 2013-01-29: 1.5 mg via INTRAVENOUS
  Administered 2013-01-30: 4.5 mg via INTRAVENOUS
  Administered 2013-01-30: 1.5 mg via INTRAVENOUS
  Administered 2013-01-30: 3 mg via INTRAVENOUS
  Administered 2013-01-30: 21:00:00 via INTRAVENOUS
  Administered 2013-01-31: 3 mg via INTRAVENOUS
  Administered 2013-01-31 (×2): 1.5 mg via INTRAVENOUS
  Administered 2013-01-31: 4 mg via INTRAVENOUS
  Administered 2013-01-31: 3 mg via INTRAVENOUS
  Administered 2013-02-01: 1.5 mg via INTRAVENOUS
  Administered 2013-02-01: 6 mg via INTRAVENOUS
  Administered 2013-02-01: 18:00:00 via INTRAVENOUS
  Administered 2013-02-01: 1.5 mg via INTRAVENOUS
  Administered 2013-02-01: 4.5 mg via INTRAVENOUS
  Administered 2013-02-02 (×3): 3 mg via INTRAVENOUS
  Administered 2013-02-02: 1.5 mg via INTRAVENOUS
  Administered 2013-02-02: 4.7 mg via INTRAVENOUS
  Administered 2013-02-02: 1.5 mg via INTRAVENOUS
  Administered 2013-02-02 – 2013-02-03 (×2): 3 mg via INTRAVENOUS
  Administered 2013-02-03: 01:00:00 via INTRAVENOUS
  Administered 2013-02-03: 3 mg via INTRAVENOUS
  Administered 2013-02-03: 1.5 mg via INTRAVENOUS
  Administered 2013-02-03: 4.5 mg via INTRAVENOUS
  Administered 2013-02-03 – 2013-02-04 (×3): 1.5 mg via INTRAVENOUS
  Filled 2013-01-29 (×3): qty 25

## 2013-01-29 MED ORDER — 0.9 % SODIUM CHLORIDE (POUR BTL) OPTIME
TOPICAL | Status: DC | PRN
  Administered 2013-01-29: 2000 mL

## 2013-01-29 MED ORDER — NEOSTIGMINE METHYLSULFATE 1 MG/ML IJ SOLN
INTRAMUSCULAR | Status: DC | PRN
  Administered 2013-01-29: 4 mg via INTRAVENOUS

## 2013-01-29 MED ORDER — DIPHENHYDRAMINE HCL 12.5 MG/5ML PO ELIX: 12.5000 mg | ORAL_SOLUTION | Freq: Four times a day (QID) | ORAL | Status: DC | PRN

## 2013-01-29 MED ORDER — SUFENTANIL CITRATE 50 MCG/ML IV SOLN
INTRAVENOUS | Status: DC | PRN
  Administered 2013-01-29: 15 ug via INTRAVENOUS
  Administered 2013-01-29 (×2): 10 ug via INTRAVENOUS
  Administered 2013-01-29: 5 ug via INTRAVENOUS
  Administered 2013-01-29: 10 ug via INTRAVENOUS

## 2013-01-29 MED ORDER — ALVIMOPAN 12 MG PO CAPS
12.0000 mg | ORAL_CAPSULE | Freq: Once | ORAL | Status: AC
  Administered 2013-01-29: 12 mg via ORAL
  Filled 2013-01-29: qty 1

## 2013-01-29 MED ORDER — PROMETHAZINE HCL 25 MG/ML IJ SOLN: 6.2500 mg | INTRAMUSCULAR | Status: DC | PRN

## 2013-01-29 MED ORDER — HYDROCHLOROTHIAZIDE 12.5 MG PO CAPS
12.5000 mg | ORAL_CAPSULE | Freq: Every morning | ORAL | Status: DC
  Administered 2013-01-30 – 2013-01-31 (×2): 12.5 mg via ORAL
  Filled 2013-01-29 (×3): qty 1

## 2013-01-29 MED ORDER — HYDROMORPHONE HCL PF 1 MG/ML IJ SOLN
INTRAMUSCULAR | Status: DC | PRN
  Administered 2013-01-29 (×2): 1 mg via INTRAVENOUS

## 2013-01-29 MED ORDER — MIDAZOLAM HCL 5 MG/5ML IJ SOLN
INTRAMUSCULAR | Status: DC | PRN
  Administered 2013-01-29: 2 mg via INTRAVENOUS

## 2013-01-29 MED ORDER — LIDOCAINE HCL (CARDIAC) 20 MG/ML IV SOLN
INTRAVENOUS | Status: DC | PRN
  Administered 2013-01-29: 90 mg via INTRAVENOUS

## 2013-01-29 MED ORDER — DIPHENHYDRAMINE HCL 50 MG/ML IJ SOLN: 12.5000 mg | Freq: Four times a day (QID) | INTRAMUSCULAR | Status: DC | PRN

## 2013-01-29 MED ORDER — HYDROMORPHONE HCL PF 1 MG/ML IJ SOLN
0.2500 mg | INTRAMUSCULAR | Status: DC | PRN
  Administered 2013-01-29 (×2): 0.5 mg via INTRAVENOUS

## 2013-01-29 MED ORDER — PANTOPRAZOLE SODIUM 40 MG PO TBEC
40.0000 mg | DELAYED_RELEASE_TABLET | Freq: Every day | ORAL | Status: DC
  Administered 2013-01-30 – 2013-02-08 (×10): 40 mg via ORAL
  Filled 2013-01-29 (×11): qty 1

## 2013-01-29 MED ORDER — ONDANSETRON HCL 4 MG/2ML IJ SOLN
INTRAMUSCULAR | Status: DC | PRN
  Administered 2013-01-29: 4 mg via INTRAVENOUS

## 2013-01-29 MED ORDER — LACTATED RINGERS IV SOLN
INTRAVENOUS | Status: DC | PRN
  Administered 2013-01-29 (×2): via INTRAVENOUS

## 2013-01-29 MED ORDER — NALOXONE HCL 0.4 MG/ML IJ SOLN: 0.4000 mg | INTRAMUSCULAR | Status: DC | PRN

## 2013-01-29 MED ORDER — LACTATED RINGERS IV SOLN: INTRAVENOUS | Status: DC

## 2013-01-29 MED ORDER — BACLOFEN 10 MG PO TABS
10.0000 mg | ORAL_TABLET | Freq: Every day | ORAL | Status: DC | PRN
  Filled 2013-01-29: qty 1

## 2013-01-29 MED ORDER — SUCCINYLCHOLINE CHLORIDE 20 MG/ML IJ SOLN
INTRAMUSCULAR | Status: DC | PRN
  Administered 2013-01-29: 100 mg via INTRAVENOUS

## 2013-01-29 MED ORDER — METFORMIN HCL 500 MG PO TABS
1000.0000 mg | ORAL_TABLET | Freq: Two times a day (BID) | ORAL | Status: DC
  Administered 2013-01-31 (×2): 1000 mg via ORAL
  Filled 2013-01-29 (×5): qty 2

## 2013-01-29 MED ORDER — BUPIVACAINE-EPINEPHRINE 0.25% -1:200000 IJ SOLN
INTRAMUSCULAR | Status: DC | PRN
  Administered 2013-01-29: 13 mL

## 2013-01-29 MED ORDER — CLINDAMYCIN PHOSPHATE 900 MG/50ML IV SOLN
900.0000 mg | Freq: Once | INTRAVENOUS | Status: AC
  Administered 2013-01-29: 900 mg via INTRAVENOUS
  Filled 2013-01-29: qty 50

## 2013-01-29 MED ORDER — MEPERIDINE HCL 50 MG/ML IJ SOLN: 6.2500 mg | INTRAMUSCULAR | Status: DC | PRN

## 2013-01-29 MED ORDER — GABAPENTIN 300 MG PO CAPS
300.0000 mg | ORAL_CAPSULE | Freq: Two times a day (BID) | ORAL | Status: DC
Start: 2013-01-29 — End: 2013-02-08
  Administered 2013-01-29 – 2013-02-08 (×20): 300 mg via ORAL
  Filled 2013-01-29 (×22): qty 1

## 2013-01-29 MED ORDER — ALBUTEROL SULFATE HFA 108 (90 BASE) MCG/ACT IN AERS
2.0000 | INHALATION_SPRAY | RESPIRATORY_TRACT | Status: DC | PRN
  Filled 2013-01-29: qty 6.7

## 2013-01-29 MED ORDER — ENOXAPARIN SODIUM 40 MG/0.4ML ~~LOC~~ SOLN
40.0000 mg | SUBCUTANEOUS | Status: DC
  Administered 2013-01-30 – 2013-01-31 (×2): 40 mg via SUBCUTANEOUS
  Filled 2013-01-29 (×4): qty 0.4

## 2013-01-29 MED ORDER — LACTATED RINGERS IR SOLN
Status: DC | PRN
  Administered 2013-01-29: 1000 mL

## 2013-01-29 MED ORDER — CITALOPRAM HYDROBROMIDE 20 MG PO TABS
20.0000 mg | ORAL_TABLET | Freq: Every morning | ORAL | Status: DC
  Administered 2013-01-30 – 2013-02-08 (×10): 20 mg via ORAL
  Filled 2013-01-29 (×10): qty 1

## 2013-01-29 MED ORDER — ROCURONIUM BROMIDE 100 MG/10ML IV SOLN
INTRAVENOUS | Status: DC | PRN
  Administered 2013-01-29: 40 mg via INTRAVENOUS
  Administered 2013-01-29: 10 mg via INTRAVENOUS

## 2013-01-29 MED ORDER — AMLODIPINE BESYLATE 10 MG PO TABS
10.0000 mg | ORAL_TABLET | Freq: Every morning | ORAL | Status: DC
  Administered 2013-01-30 – 2013-02-08 (×10): 10 mg via ORAL
  Filled 2013-01-29 (×11): qty 1

## 2013-01-29 MED ORDER — HEPARIN SODIUM (PORCINE) 5000 UNIT/ML IJ SOLN
5000.0000 [IU] | Freq: Once | INTRAMUSCULAR | Status: AC
  Administered 2013-01-29: 5000 [IU] via SUBCUTANEOUS
  Filled 2013-01-29: qty 1

## 2013-01-29 MED ORDER — ONDANSETRON HCL 4 MG/2ML IJ SOLN
4.0000 mg | Freq: Four times a day (QID) | INTRAMUSCULAR | Status: DC | PRN
  Administered 2013-01-30 – 2013-02-03 (×3): 4 mg via INTRAVENOUS
  Filled 2013-01-29 (×3): qty 2

## 2013-01-29 MED ORDER — ALVIMOPAN 12 MG PO CAPS
12.0000 mg | ORAL_CAPSULE | Freq: Two times a day (BID) | ORAL | Status: AC
Start: 2013-01-30 — End: 2013-02-05
  Administered 2013-01-30 – 2013-02-05 (×14): 12 mg via ORAL
  Filled 2013-01-29 (×15): qty 1

## 2013-01-29 SURGICAL SUPPLY — 84 items
ADH SKN CLS APL DERMABOND .7 (GAUZE/BANDAGES/DRESSINGS) ×1
APPLIER CLIP 5 13 M/L LIGAMAX5 (MISCELLANEOUS) ×2
APR CLP MED LRG 5 ANG JAW (MISCELLANEOUS) ×1
BLADE EXTENDED COATED 6.5IN (ELECTRODE) ×2 IMPLANT
BLADE HEX COATED 2.75 (ELECTRODE) ×4 IMPLANT
BLADE SURG SZ10 CARB STEEL (BLADE) ×2 IMPLANT
CABLE HIGH FREQUENCY MONO STRZ (ELECTRODE) ×2 IMPLANT
CANISTER SUCTION 2500CC (MISCELLANEOUS) ×2 IMPLANT
CELLS DAT CNTRL 66122 CELL SVR (MISCELLANEOUS) IMPLANT
CHLORAPREP W/TINT 26ML (MISCELLANEOUS) ×2 IMPLANT
CLIP APPLIE 5 13 M/L LIGAMAX5 (MISCELLANEOUS) ×1 IMPLANT
CLOTH BEACON ORANGE TIMEOUT ST (SAFETY) ×2 IMPLANT
COVER MAYO STAND STRL (DRAPES) ×4 IMPLANT
DECANTER SPIKE VIAL GLASS SM (MISCELLANEOUS) ×1 IMPLANT
DERMABOND ADVANCED (GAUZE/BANDAGES/DRESSINGS) ×1
DERMABOND ADVANCED .7 DNX12 (GAUZE/BANDAGES/DRESSINGS) IMPLANT
DRAIN CHANNEL 19F RND (DRAIN) IMPLANT
DRAPE LAPAROSCOPIC ABDOMINAL (DRAPES) ×2 IMPLANT
DRAPE LG THREE QUARTER DISP (DRAPES) ×1 IMPLANT
DRAPE UTILITY 15X26 (DRAPE) ×4 IMPLANT
DRAPE WARM FLUID 44X44 (DRAPE) ×2 IMPLANT
DRSG OPSITE POSTOP 4X8 (GAUZE/BANDAGES/DRESSINGS) ×1 IMPLANT
ELECT REM PT RETURN 9FT ADLT (ELECTROSURGICAL) ×2
ELECTRODE REM PT RTRN 9FT ADLT (ELECTROSURGICAL) ×1 IMPLANT
EVACUATOR SILICONE 100CC (DRAIN) ×1 IMPLANT
GLOVE BIO SURGEON STRL SZ 6.5 (GLOVE) ×6 IMPLANT
GLOVE BIOGEL PI IND STRL 7.0 (GLOVE) ×2 IMPLANT
GLOVE BIOGEL PI IND STRL 8 (GLOVE) IMPLANT
GLOVE BIOGEL PI INDICATOR 7.0 (GLOVE) ×4
GLOVE BIOGEL PI INDICATOR 8 (GLOVE) ×2
GLOVE ECLIPSE 6.5 STRL STRAW (GLOVE) ×2 IMPLANT
GLOVE ECLIPSE 8.0 STRL XLNG CF (GLOVE) ×2 IMPLANT
GOWN BRE IMP PREV XXLGXLNG (GOWN DISPOSABLE) ×4 IMPLANT
GOWN STRL REIN XL XLG (GOWN DISPOSABLE) ×10 IMPLANT
KIT BASIN OR (CUSTOM PROCEDURE TRAY) ×2 IMPLANT
LEGGING LITHOTOMY PAIR STRL (DRAPES) ×2 IMPLANT
LIGASURE IMPACT 36 18CM CVD LR (INSTRUMENTS) IMPLANT
NS IRRIG 1000ML POUR BTL (IV SOLUTION) ×3 IMPLANT
PENCIL BUTTON HOLSTER BLD 10FT (ELECTRODE) ×4 IMPLANT
RETRACTOR LONE STAR DISPOSABLE (INSTRUMENTS) ×2 IMPLANT
RETRACTOR STAY HOOK 5MM (MISCELLANEOUS) ×2 IMPLANT
RETRACTOR WND ALEXIS 18 MED (MISCELLANEOUS) IMPLANT
RTRCTR WOUND ALEXIS 18CM MED (MISCELLANEOUS)
SCISSORS LAP 5X35 DISP (ENDOMECHANICALS) ×2 IMPLANT
SEALER TISSUE G2 CVD JAW 35 (ENDOMECHANICALS) IMPLANT
SEALER TISSUE G2 CVD JAW 45CM (ENDOMECHANICALS)
SEALER TISSUE G2 STRG ARTC 35C (ENDOMECHANICALS) ×2 IMPLANT
SET IRRIG TUBING LAPAROSCOPIC (IRRIGATION / IRRIGATOR) ×2 IMPLANT
SLEEVE XCEL OPT CAN 5 100 (ENDOMECHANICALS) ×5 IMPLANT
SOLUTION ANTI FOG 6CC (MISCELLANEOUS) ×2 IMPLANT
SPONGE DRAIN TRACH 4X4 STRL 2S (GAUZE/BANDAGES/DRESSINGS) ×1 IMPLANT
SPONGE GAUZE 4X4 12PLY (GAUZE/BANDAGES/DRESSINGS) ×2 IMPLANT
SPONGE LAP 18X18 X RAY DECT (DISPOSABLE) ×4 IMPLANT
STAPLER CIRC ILS CVD 33MM 37CM (STAPLE) ×1 IMPLANT
STAPLER CUT CVD 40MM BLUE (STAPLE) ×1 IMPLANT
STAPLER CUT RELOAD BLUE (STAPLE) ×1 IMPLANT
STAPLER VISISTAT 35W (STAPLE) ×2 IMPLANT
SUCTION POOLE TIP (SUCTIONS) ×2 IMPLANT
SUT ETHILON 2 0 PS N (SUTURE) ×3 IMPLANT
SUT NOVA NAB DX-16 0-1 5-0 T12 (SUTURE) IMPLANT
SUT PDS AB 1 CTX 36 (SUTURE) ×2 IMPLANT
SUT PDS AB 1 TP1 96 (SUTURE) ×2 IMPLANT
SUT PROLENE 2 0 KS (SUTURE) ×2 IMPLANT
SUT SILK 2 0 (SUTURE)
SUT SILK 2 0 SH CR/8 (SUTURE) ×2 IMPLANT
SUT SILK 2-0 18XBRD TIE 12 (SUTURE) ×1 IMPLANT
SUT SILK 3 0 (SUTURE)
SUT SILK 3 0 SH CR/8 (SUTURE) ×2 IMPLANT
SUT SILK 3-0 18XBRD TIE 12 (SUTURE) ×1 IMPLANT
SUT VIC AB 2-0 CT1 27 (SUTURE) ×2
SUT VIC AB 2-0 CT1 TAPERPNT 27 (SUTURE) IMPLANT
SUT VIC AB 2-0 SH 18 (SUTURE) ×2 IMPLANT
SUT VIC AB 4-0 PS2 18 (SUTURE) ×1 IMPLANT
SUT VICRYL 2 0 18  UND BR (SUTURE)
SUT VICRYL 2 0 18 UND BR (SUTURE) IMPLANT
SYS LAPSCP GELPORT 120MM (MISCELLANEOUS)
SYSTEM LAPSCP GELPORT 120MM (MISCELLANEOUS) IMPLANT
TOWEL OR 17X26 10 PK STRL BLUE (TOWEL DISPOSABLE) ×4 IMPLANT
TRAY FOLEY CATH 14FRSI W/METER (CATHETERS) ×2 IMPLANT
TRAY LAP CHOLE (CUSTOM PROCEDURE TRAY) ×2 IMPLANT
TROCAR BLADELESS OPT 5 100 (ENDOMECHANICALS) ×2 IMPLANT
TROCAR XCEL BLUNT TIP 100MML (ENDOMECHANICALS) ×2 IMPLANT
TUBING FILTER THERMOFLATOR (ELECTROSURGICAL) ×2 IMPLANT
YANKAUER SUCT BULB TIP 10FT TU (MISCELLANEOUS) ×4 IMPLANT

## 2013-01-29 NOTE — Anesthesia Preprocedure Evaluation (Addendum)
Anesthesia Evaluation  Patient identified by MRN, date of birth, ID band Patient awake    Reviewed: Allergy & Precautions, H&P , NPO status , Patient's Chart, lab work & pertinent test results  Airway Mallampati: II TM Distance: >3 FB Neck ROM: Full    Dental no notable dental hx.    Pulmonary asthma , Current Smoker,  breath sounds clear to auscultation  Pulmonary exam normal       Cardiovascular hypertension, Pt. on medications Rhythm:Regular Rate:Normal     Neuro/Psych negative neurological ROS  negative psych ROS   GI/Hepatic Neg liver ROS, GERD-  Medicated and Controlled,  Endo/Other  negative endocrine ROSdiabetes, Type 2, Oral Hypoglycemic Agents  Renal/GU negative Renal ROS  negative genitourinary   Musculoskeletal negative musculoskeletal ROS (+)   Abdominal   Peds negative pediatric ROS (+)  Hematology negative hematology ROS (+)   Anesthesia Other Findings Upper front teeth caps  Reproductive/Obstetrics negative OB ROS                          Anesthesia Physical Anesthesia Plan  ASA: II  Anesthesia Plan: General   Post-op Pain Management:    Induction: Intravenous  Airway Management Planned: Oral ETT  Additional Equipment:   Intra-op Plan:   Post-operative Plan: Extubation in OR  Informed Consent: I have reviewed the patients History and Physical, chart, labs and discussed the procedure including the risks, benefits and alternatives for the proposed anesthesia with the patient or authorized representative who has indicated his/her understanding and acceptance.   Dental advisory given  Plan Discussed with: CRNA  Anesthesia Plan Comments:         Anesthesia Quick Evaluation  

## 2013-01-29 NOTE — H&P (View-Only) (Signed)
Dr. Marcello Moores - Erica [name] is coming to [facility] tomorrow  8/12 at 10:00 am for her preop - her surgery is on 8/19.  Please enter her preop orders in Epic for the surgery.   Thanks

## 2013-01-29 NOTE — Op Note (Signed)
01/29/2013  11:05 AM  PATIENT:  Erica [name]  57 y.o. 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  PROCEDURE:  LAPAROSCOPIC LOW ANTERIOR RESECTION, Rigid Proctoscopy  SURGEON:  Surgeon(s): Leighton Ruff, MD Adin Hector, MD  ASSISTANT: Johney [place]   ANESTHESIA:   local and general  EBL: 176m  Total I/O In: 1000 [I.V.:1000] Out: -   Delay start of Pharmacological VTE agent (>24hrs) due to surgical blood loss or risk of bleeding:  no  DRAINS: (90F) Jackson-Pratt drain(s) with closed bulb suction in the pelvis   SPECIMEN:  Source of Specimen:  Rectosigmoid  DISPOSITION OF SPECIMEN:  PATHOLOGY  COUNTS:  YES  PLAN OF CARE: Admit to inpatient   PATIENT DISPOSITION:  PACU - hemodynamically stable.  INDICATION: this is a 57year old female with a tubulovillous adenoma noted in the proximal rectum on previous exam. An endoscopic resection was attempted but was unsuccessful. She is here today for a low anterior resection. The risks and benefits of this procedure were explained to the patient present OR and consent was signed and placed on chart.  OR FINDINGS: Rectal polyp noted at ~13 cm, anastomosis at ~9cm  DESCRIPTION: The patient was identified in the preoperative holding area and taken to the OR where they were laid supine on the operating room table. Gen. anesthesia was smoothly induced.  The patient was then placed in lithotomy position. A Foley catheter was inserted under sterile conditions.  The patient was then prepped and draped in the usual sterile fashion. A surgical timeout was performed indicating the correct patient, procedure, positioning and preoperative antibiotics. SCDs were noted to be in place and functioning prior to the operation.  I began by gaining entry  into the abdomen using an optical entry trocar. My entry was clean. There were no signs of damage to underlying structures. The abdomen was insufflated to approximately 15 mm of mercury. A second 5 mm port was placed in the right upper quadrant under direct visualization. A third port was placed in the upper midline also under direct visualization. Lastly a 5 mm port was placed approximately 2 cm above the pubis under direct visualization.  The abdomen was evaluated. The small and large bowel appeared to be normal caliber. There were no signs of peritoneal metastases. The liver appeared normal with no signs of metastatic disease.   The patient was then placed in Trendelenburg position. The small bowel was evacuated from the pelvis. The omentum was placed into the upper abdomen. The sigmoid colon was identified adherent to the left lateral sidewall. These adhesions were taken down using scopic scissors. After this was completed it was determined that the patient had a large redundant sigmoid and will most likely not need any mobilization of the splenic flexure. I identified the IMA at the takeoff of the aorta. I opened the mesentery approximately 2 cm above this. I dissected out the artery and vein. I identified the left ureter and its standard position. This was mobilized away from the vascular structures. The vascular structures were then transected using the Enseal device.   After this was completed I continued to mobilize the rectum from the presacral space. This was done using blunt dissection and the Enseal device. I mobilized posteriorly down to the level of the coccyx. I identified the tattoo placed previously endoscopically.  I mobilized the lateral sidewalls and transected the peritoneum using the Enseal device. I continued anteriorly to the level of the anterior flexion. Once my 2 dissection planes were connected anteriorly, the rectum was freely mobilized.   After this I performed a rigid proctoscopy  to evaluate the exact location of the polyp. This was identified as well as a distal resection margin. I then used the Enseal device to fan out the distal rectum mesentery. Once this was cleared, I made a Pfannenstiel incision approximately 2 cm above the pubis an Alexis wound protector was placed. The sigmoid colon was mobilized out of the pelvis. A portion of the distal sigmoid was transected to be involved with the specimen. This was done using a blue load contour device. The remaining mesentery was divided down to the level of our previous dissection. I then placed a contour blue load stapler into the pelvis and transected the rectum at the previously identified distal rectal margin. The specimen was then marked with a suture on the distal margin. We opened this on the back table and the polyp was noted within 3-4 cm of the distal margin.  After this was completed the remaining sigmoid was evaluated. The staple line was removed. EEA sizers were used to identify the correct size of stapler. A 33 mm EEA stapler was selected. A pursestring here was placed across the colon. A 2-0 Prolene suture was placed into the pursestring device.  5 3-0 silk sutures were used to secure the pursestring suture in place. The anvil was then placed into the colon. The pursestring suture was tied into place over the anvil. This was then placed back into the pelvis. An EEA stapler was inserted into the rectum and brought out to the rectal stump margin. Our anastomosis was created with no other surrounding tissues within it. There was no tension noted up across the anastomosis. The anastomosis was insufflated under water and there were no air leaks. The anastomosis was evaluated using the proctoscope and was noted to be approximately 9 cm from the anal verge. At this point a 32 [place] Blake drain was placed into the pelvis. This was brought out through the right port site. This was secured in place with a 2-0 nylon suture. The abdomen  was irrigated with normal saline. All dirty equipment was then removed. We switched to clean instruments drapes and gowns.  The peritoneum was then closed using 2 running 2-0 Vicryl sutures. The fascia was then closed using 2 #1 looped PDS sutures.  The subcutaneous tissue was reapproximated using interrupted 2-0 Vicryl sutures. The dermal layer was also reapproximated using interrupted 2-0 Vicryl sutures. The skin was closed using a running 4-0 Vicryl suture.  The remaining 25 mm port sites were closed using 4-0 Vicryl suture. Dermabond was placed over these port sites. A sterile dressing was placed over the Pfannenstiel incision. The patient was then awakened from anesthesia and sent to the postanesthesia care unit in stable condition. All counts were correct per operating room staff.

## 2013-01-29 NOTE — Progress Notes (Signed)
Pt complains of lower right lip numbness and swelling; Dr. Marcell Barlow, anesthes, in to check pt; pt reassured

## 2013-01-29 NOTE — OR Nursing (Signed)
Timeout for procedure done at 0810 not 0820 as first documented. Correct time added. Adonis Huguenin RN

## 2013-01-29 NOTE — Interval H&P Note (Signed)
History and Physical Interval Note:  01/29/2013 7:16 AM  Erica [name]  has presented today for surgery, with the diagnosis of rectal polyp  The various methods of treatment have been discussed with the patient and family. After consideration of risks, benefits and other options for treatment, the patient has consented to  Procedure(s): LAPAROSCOPIC LOW ANTERIOR RESECTION (N/A) as a surgical intervention .  The patient's history has been reviewed, patient examined, no change in status, stable for surgery.  I have reviewed the patient's chart and labs.  Questions were answered to the patient's satisfaction.     Rosario Adie, MD  Colorectal and [facility]

## 2013-01-29 NOTE — Transfer of Care (Signed)
Immediate Anesthesia Transfer of Care Note  Patient: Erica [name]  Procedure(s) Performed: Procedure(s): LAPAROSCOPIC LOW ANTERIOR RESECTION, Rigid Proctoscopy (N/A)  Patient Location: PACU  Anesthesia Type:General  Level of Consciousness: sedated  Airway & Oxygen Therapy: Patient Spontanous Breathing and Patient connected to face mask oxygen  Post-op Assessment: Report given to PACU RN and Post -op Vital signs reviewed and stable  Post vital signs: Reviewed and stable  Complications: No apparent anesthesia complications

## 2013-01-29 NOTE — Anesthesia Postprocedure Evaluation (Signed)
  Anesthesia Post-op Note  Patient: Erica [name]  Procedure(s) Performed: Procedure(s) (LRB): LAPAROSCOPIC LOW ANTERIOR RESECTION, Rigid Proctoscopy (N/A)  Patient Location: PACU  Anesthesia Type: General  Level of Consciousness: awake and alert   Airway and Oxygen Therapy: Patient Spontanous Breathing  Post-op Pain: mild  Post-op Assessment: Post-op Vital signs reviewed, Patient's Cardiovascular Status Stable, Respiratory Function Stable, Patent Airway and No signs of Nausea or vomiting  Last Vitals:  Filed Vitals:   01/29/13 1315  BP: 111/80  Pulse: 94  Temp:   Resp: 16    Post-op Vital Signs: stable   Complications: Pt c/o numb lower right lip. Slight amount of swelling. ? Emergence self inflicted bite or pressure from ETT. Ressurance of resolution expressed to patient.

## 2013-01-30 ENCOUNTER — Encounter (HOSPITAL_COMMUNITY): Payer: Self-pay | Admitting: General Surgery

## 2013-01-30 DIAGNOSIS — C20 Malignant neoplasm of rectum: Secondary | ICD-10-CM | POA: Insufficient documentation

## 2013-01-30 LAB — CBC
HCT: 30.2 % — ABNORMAL LOW (ref 36.0–46.0)
Hemoglobin: 10.9 g/dL — ABNORMAL LOW (ref 12.0–15.0)
MCH: 32.5 pg (ref 26.0–34.0)
MCHC: 36.1 g/dL — ABNORMAL HIGH (ref 30.0–36.0)
MCV: 90.1 fL (ref 78.0–100.0)
Platelets: 163 10*3/uL (ref 150–400)
RBC: 3.35 MIL/uL — ABNORMAL LOW (ref 3.87–5.11)
RDW: 14.9 % (ref 11.5–15.5)
WBC: 10.6 10*3/uL — ABNORMAL HIGH (ref 4.0–10.5)

## 2013-01-30 LAB — BASIC METABOLIC PANEL
BUN: 16 mg/dL (ref 6–23)
CO2: 26 mEq/L (ref 19–32)
Calcium: 8.7 mg/dL (ref 8.4–10.5)
Chloride: 100 mEq/L (ref 96–112)
Creatinine, Ser: 1.47 mg/dL — ABNORMAL HIGH (ref 0.50–1.10)
GFR calc Af Amer: 44 mL/min — ABNORMAL LOW (ref >90)
GFR calc non Af Amer: 38 mL/min — ABNORMAL LOW (ref >90)
Glucose, Bld: 121 mg/dL — ABNORMAL HIGH (ref 70–99)
Potassium: 3.8 mEq/L (ref 3.5–5.1)
Sodium: 133 mEq/L — ABNORMAL LOW (ref 135–145)

## 2013-01-30 LAB — GLUCOSE, CAPILLARY
Glucose-Capillary: 117 mg/dL — ABNORMAL HIGH (ref 70–99)
Glucose-Capillary: 124 mg/dL — ABNORMAL HIGH (ref 70–99)
Glucose-Capillary: 158 mg/dL — ABNORMAL HIGH (ref 70–99)
Glucose-Capillary: 192 mg/dL — ABNORMAL HIGH (ref 70–99)
Glucose-Capillary: 166 mg/dL — ABNORMAL HIGH (ref 70–99)

## 2013-01-30 MED ORDER — KCL IN DEXTROSE-NACL 20-5-0.45 MEQ/L-%-% IV SOLN
INTRAVENOUS | Status: DC
  Administered 2013-01-30 – 2013-02-01 (×3): via INTRAVENOUS
  Administered 2013-02-01: 50 mL/h via INTRAVENOUS
  Administered 2013-02-02: 100 mL/h via INTRAVENOUS
  Administered 2013-02-02: 75 mL/h via INTRAVENOUS
  Administered 2013-02-03: 01:00:00 via INTRAVENOUS
  Filled 2013-01-30 (×9): qty 1000

## 2013-01-30 NOTE — Care Management Note (Addendum)
    Page 1 of 1   02/08/2013     3:18:33 PM   CARE MANAGEMENT NOTE 02/08/2013  Patient:  Erica [name], Erica [name]   Account Number:  [id number]  Date Initiated:  01/30/2013  Documentation initiated by:  Sunday Spillers  Subjective/Objective Assessment:   57 yo female admitted s/p LAR.  PTA lived at home alone.     Action/Plan:   Home when stable   Anticipated DC Date:  02/09/2013   Anticipated DC Plan:  [place]  CM consult      [facility] Choice  HOME HEALTH   Choice offered to / List presented to:  C-1 Patient        [place] arranged  HH-1 RN      [place].   Status of service:  Completed, signed off Medicare Important Message given?   (If response is "NO", the following Medicare IM given date fields will be blank) Date Medicare IM given:   Date Additional Medicare IM given:    Discharge Disposition:  [place]  Per UR Regulation:  Reviewed for med. necessity/level of care/duration of stay  If discussed at [place] of Stay Meetings, dates discussed:    Comments:  02-08-13 Sunday Spillers RN CM 1500 Recieived [name] request to arrange [facility] RN for wound care. Spoke with patient states she has services with [facility] and wants to use them for wound care. Contacted them at [phone] and they state they do not provide skilled services only custodial. Patient then chose [facility] for [facility] services, contacted [facility] and they will follow for [facility] services.

## 2013-01-30 NOTE — Progress Notes (Signed)
1 Day Post-Op lap LAR Subjective: Doing ok, "very sore".  No nausea, has been oob once  Objective: Vital signs in last 24 hours: Temp:  [97.7 F (36.5 C)-99.7 F (37.6 C)] 98.5 F (36.9 C) (08/20 0959) Pulse Rate:  [71-98] 72 (08/20 0959) Resp:  [8-22] 22 (08/20 0959) BP: (107-152)/(68-96) 131/86 mmHg (08/20 0959) SpO2:  [97 %-100 %] 100 % (08/20 0959) Weight:  [176 lb (79.833 kg)] 176 lb (79.833 kg) (08/19 1530)   Intake/Output from previous day: 08/19 0701 - 08/20 0700 In: 2868.8 [I.V.:2868.8] Out: 2110 [Urine:1950; Drains:160] Intake/Output this shift: Total I/O In: 0  Out: 615 [Urine:600; Drains:15]   General appearance: alert and cooperative GI: normal findings: soft, non-tender  Incision: no significant drainage  Lab Results:   Recent Labs  01/30/13 0405  WBC 10.6*  HGB 10.9*  HCT 30.2*  PLT 163   BMET  Recent Labs  01/30/13 0405  NA 133*  K 3.8  CL 100  CO2 26  GLUCOSE 121*  BUN 16  CREATININE 1.47*  CALCIUM 8.7   PT/INR No results found for this basename: LABPROT, INR,  in the last 72 hours ABG No results found for this basename: PHART, PCO2, PO2, HCO3,  in the last 72 hours  MEDS, Scheduled . alvimopan  12 mg Oral BID  . amLODipine  10 mg Oral q morning - 10a  . citalopram  20 mg Oral q morning - 10a  . enoxaparin (LOVENOX) injection  40 mg Subcutaneous Q24H  . gabapentin  300 mg Oral BID  . hydrochlorothiazide  12.5 mg Oral q morning - 10a  . insulin aspart  0-15 Units Subcutaneous Q4H  . [START ON 01/31/2013] metFORMIN  1,000 mg Oral BID WC  . morphine   Intravenous Q4H  . pantoprazole  40 mg Oral Daily    Studies/Results: No results found.  Assessment: s/p Procedure(s): LAPAROSCOPIC LOW ANTERIOR RESECTION, Rigid Proctoscopy There are no active problems to display for this patient.   Doing well.  Expected post op course  Plan: Advance diet to clears, decrease IVF's to 50m/h Cont PCA for pain control Ambulate TID D/C  foley tomorrow    LOS: 1 day     .ARosario Adie MWhittierSurgery, [place]  01/30/2013 10:12 AM

## 2013-01-30 NOTE — Evaluation (Signed)
Physical Therapy Evaluation Patient Details Name: Erica [name] MRN: [id number] DOB: 09/22/1955 Today's Date: 01/30/2013 Time: 9024-0973 PT Time Calculation (min): 20 min  PT Assessment / Plan / Recommendation History of Present Illness  57 yo female admitted for Laparocopic anterior resection of rectal polyp performed 01/29/13  Clinical Impression  Pt limited by pain and lightheadedness today   PT Assessment  Patient needs continued PT services    Follow Up Recommendations  No PT follow up    Does the patient have the potential to tolerate intense rehabilitation      Barriers to Discharge   pt lives in second floor apartment    Equipment Recommendations  Rolling walker with 5" wheels    Recommendations for Other Services     Frequency Min 3X/week    Precautions / Restrictions     Pertinent Vitals/Pain Pt using PCA, but is still limited by abdominal pain      Mobility  Bed Mobility Bed Mobility: Supine to Sit;Sit to Supine Supine to Sit: 3: Mod assist;HOB elevated Sit to Supine: 1: +2 Total assist;HOB elevated Sit to Supine: Patient Percentage: 50% Details for Bed Mobility Assistance: pt very limited by pain  Transfers Transfers: Sit to Stand;Stand to Sit Sit to Stand: 3: Mod assist Stand to Sit: 3: Mod assist Details for Transfer Assistance: painful  Ambulation/Gait Ambulation/Gait Assistance: 3: Mod assist Ambulation Distance (Feet): 3 Feet Assistive device: Rolling walker Ambulation/Gait Assistance Details: mod assist needed as pt became very lightheaded and had to return to supine  Pt had just used PCA  Gait Pattern: Step-to pattern;Decreased step length - right;Decreased step length - left;Antalgic General Gait Details: limited by pain in abdomen    Exercises     PT Diagnosis: Difficulty walking;Acute pain;Generalized weakness  PT Problem List: Pain;Decreased activity tolerance;Decreased mobility;Decreased strength;Decreased safety awareness;Decreased  balance;Decreased knowledge of use of DME PT Treatment Interventions: Gait training;Functional mobility training;Therapeutic activities;Therapeutic exercise;Balance training;Patient/family education     PT Goals(Current goals can be found in the care plan section) Acute Rehab PT Goals Patient Stated Goal: to have pain decrease PT Goal Formulation: With patient Time For Goal Achievement: 02/13/13 Potential to Achieve Goals: Good  Visit Information  Last PT Received On: 01/30/13 Assistance Needed: +2 History of Present Illness: 57 yo female admitted for Laparocopic anterior resection of rectal polyp performed 01/29/13       Prior [place] expects to be discharged to:: Private residence Living Arrangements: Other relatives Available Help at Discharge: Family Type of Home: Apartment Home Access: Stairs to enter Technical brewer of Steps: flight Entrance Stairs-Rails: Right;Left Home Layout: One level Home Equipment: None Prior Function Level of Independence: Independent Communication Communication: No difficulties    Cognition  Cognition Arousal/Alertness: Awake/alert Behavior During Therapy: WFL for tasks assessed/performed Overall Cognitive Status: Within Functional Limits for tasks assessed    Extremity/Trunk Assessment Lower Extremity Assessment Lower Extremity Assessment: Generalized weakness;Overall [facility] for tasks assessed Cervical / Trunk Assessment Cervical / Trunk Assessment: Other exceptions Cervical / Trunk Exceptions: pt with round abdomen, guarding, not able to perform abd sets due to pain   Balance Balance Balance Assessed: Yes Static Sitting Balance Static Sitting - Balance Support: No upper extremity supported;Feet supported Static Sitting - Level of Assistance: 5: Stand by assistance Static Standing Balance Static Standing - Balance Support: Bilateral upper extremity supported Static Standing - Level of Assistance: 3:  Mod assist Static Standing - Comment/# of Minutes: Pt limited by pain and lightheadedness  End of  Session PT - End of Session Activity Tolerance: Patient limited by pain Patient left: in bed Nurse Communication: Mobility status  GP    Donato Heinz. [place], [place] 01/30/2013, 4:24 PM

## 2013-01-31 LAB — CBC
HCT: 34.1 % — ABNORMAL LOW (ref 36.0–46.0)
Hemoglobin: 12.3 g/dL (ref 12.0–15.0)
MCH: 32.5 pg (ref 26.0–34.0)
MCHC: 36.1 g/dL — ABNORMAL HIGH (ref 30.0–36.0)
MCV: 90.2 fL (ref 78.0–100.0)
Platelets: 190 10*3/uL (ref 150–400)
RBC: 3.78 MIL/uL — ABNORMAL LOW (ref 3.87–5.11)
RDW: 14.8 % (ref 11.5–15.5)
WBC: 9.9 10*3/uL (ref 4.0–10.5)

## 2013-01-31 LAB — BASIC METABOLIC PANEL
BUN: 15 mg/dL (ref 6–23)
CO2: 26 mEq/L (ref 19–32)
Calcium: 9.2 mg/dL (ref 8.4–10.5)
Chloride: 100 mEq/L (ref 96–112)
Creatinine, Ser: 1.14 mg/dL — ABNORMAL HIGH (ref 0.50–1.10)
GFR calc Af Amer: 60 mL/min — ABNORMAL LOW (ref >90)
GFR calc non Af Amer: 52 mL/min — ABNORMAL LOW (ref >90)
Glucose, Bld: 217 mg/dL — ABNORMAL HIGH (ref 70–99)
Potassium: 3.8 mEq/L (ref 3.5–5.1)
Sodium: 136 mEq/L (ref 135–145)

## 2013-01-31 LAB — GLUCOSE, CAPILLARY
Glucose-Capillary: 198 mg/dL — ABNORMAL HIGH (ref 70–99)
Glucose-Capillary: 203 mg/dL — ABNORMAL HIGH (ref 70–99)
Glucose-Capillary: 181 mg/dL — ABNORMAL HIGH (ref 70–99)
Glucose-Capillary: 177 mg/dL — ABNORMAL HIGH (ref 70–99)
Glucose-Capillary: 163 mg/dL — ABNORMAL HIGH (ref 70–99)
Glucose-Capillary: 179 mg/dL — ABNORMAL HIGH (ref 70–99)

## 2013-01-31 NOTE — Progress Notes (Signed)
2 Days Post-Op lap LAR Subjective: Doing better, "less sore".  No nausea, ambulating with PT Objective: Vital signs in last 24 hours: Temp:  [98.8 F (37.1 C)-99.5 F (37.5 C)] 98.8 F (37.1 C) (08/21 0521) Pulse Rate:  [72-102] 102 (08/21 0521) Resp:  [10-23] 15 (08/21 0833) BP: (126-151)/(82-99) 133/88 mmHg (08/21 0521) SpO2:  [98 %-100 %] 100 % (08/21 0833)   Intake/Output from previous day: 08/20 0701 - 08/21 0700 In: 1557.1 [P.O.:360; I.V.:1197.1] Out: 3006 [Urine:2801; Drains:205] Intake/Output this shift: Total I/O In: -  Out: 170 [Urine:100; Drains:70]   General appearance: alert and cooperative GI: normal findings: soft, non-tender  Incision: no significant drainage  Lab Results:   Recent Labs  01/30/13 0405 01/31/13 0415  WBC 10.6* 9.9  HGB 10.9* 12.3  HCT 30.2* 34.1*  PLT 163 190   BMET  Recent Labs  01/30/13 0405 01/31/13 0415  NA 133* 136  K 3.8 3.8  CL 100 100  CO2 26 26  GLUCOSE 121* 217*  BUN 16 15  CREATININE 1.47* 1.14*  CALCIUM 8.7 9.2   PT/INR No results found for this basename: LABPROT, INR,  in the last 72 hours ABG No results found for this basename: PHART, PCO2, PO2, HCO3,  in the last 72 hours  MEDS, Scheduled . alvimopan  12 mg Oral BID  . amLODipine  10 mg Oral q morning - 10a  . citalopram  20 mg Oral q morning - 10a  . enoxaparin (LOVENOX) injection  40 mg Subcutaneous Q24H  . gabapentin  300 mg Oral BID  . hydrochlorothiazide  12.5 mg Oral q morning - 10a  . insulin aspart  0-15 Units Subcutaneous Q4H  . metFORMIN  1,000 mg Oral BID WC  . morphine   Intravenous Q4H  . pantoprazole  40 mg Oral Daily    Studies/Results: No results found.  Assessment: s/p Procedure(s): LAPAROSCOPIC LOW ANTERIOR RESECTION, Rigid Proctoscopy Patient Active Problem List   Diagnosis Date Noted  . Rectal cancer 01/30/2013    Doing well.  Expected post op course  Plan: Cont clears, IVF's to 64m/h Cont PCA for pain  control Ambulate TID D/C foley     LOS: 2 days     .ARosario Adie MTiv[place]Surgery, [place]  01/31/2013 10:13 AM

## 2013-01-31 NOTE — Progress Notes (Addendum)
Physical Therapy Treatment Patient Details Name: TANINA BARB MRN: [id number] DOB: 03-24-1956 Today's Date: 01/31/2013 Time: 1000-1023 PT Time Calculation (min): 23 min  PT Assessment / Plan / Recommendation  History of Present Illness 57 yo female admitted for Laparocopic anterior resection of rectal polyp performed 01/29/13   PT Comments   Progressing slowly with mobility. Still limited significantly by pain. Will definitely need to work on bed mobility.  Follow Up Recommendations  No PT follow up     Does the patient have the potential to tolerate intense rehabilitation     Barriers to Discharge        Equipment Recommendations  Rolling walker with 5" wheels    Recommendations for Other Services    Frequency Min 3X/week   Progress towards PT Goals Progress towards PT goals: Progressing toward goals  Plan Current plan remains appropriate    Precautions / Restrictions Precautions Precautions: Fall Precaution Comments: abdominal surgery, drain Restrictions Weight Bearing Restrictions: No   Pertinent Vitals/Pain Abdomen 6/10 at rest; 8/10 with activity. PCA available.     Mobility  Bed Mobility Bed Mobility: Supine to Sit;Sit to Supine Supine to Sit: 3: Mod assist Sit to Supine: 3: Mod assist Details for Bed Mobility Assistance: Limited by pain. Assist for trunk to upright/supine (HOB 45 degrees) and bil LEs onto bed. Increased time.  Transfers Transfers: Sit to Stand;Stand to Sit Sit to Stand: 4: Min assist;From bed Stand to Sit: 4: Min assist;To bed Ambulation/Gait Ambulation/Gait Assistance: 4: Min assist Ambulation Distance (Feet): 135 Feet Assistive device: Rolling walker Ambulation/Gait Assistance Details: Slow gait speed. assist to stabilize and maneuver with RW intermittently.  Gait Pattern: Step-through pattern;Decreased stride length    Exercises     PT Diagnosis:    PT Problem List:   PT Treatment Interventions:     PT Goals (current goals can now  be found in the care plan section)    Visit Information  Last PT Received On: 01/31/13 Assistance Needed: +1 History of Present Illness: 57 yo female admitted for Laparocopic anterior resection of rectal polyp performed 01/29/13    Subjective Data      Cognition  Cognition Arousal/Alertness: Awake/alert Behavior During Therapy: WFL for tasks assessed/performed Overall Cognitive Status: Within Functional Limits for tasks assessed    Balance     End of Session PT - End of Session Activity Tolerance: Patient limited by pain Patient left: in bed;with call bell/phone within reach;with family/visitor present   GP     Weston Anna, MPT Pager: [phone]

## 2013-02-01 ENCOUNTER — Other Ambulatory Visit (INDEPENDENT_AMBULATORY_CARE_PROVIDER_SITE_OTHER): Payer: Self-pay | Admitting: General Surgery

## 2013-02-01 DIAGNOSIS — C20 Malignant neoplasm of rectum: Secondary | ICD-10-CM

## 2013-02-01 LAB — BASIC METABOLIC PANEL
BUN: 21 mg/dL (ref 6–23)
CO2: 27 mEq/L (ref 19–32)
Calcium: 8.9 mg/dL (ref 8.4–10.5)
Chloride: 97 mEq/L (ref 96–112)
Creatinine, Ser: 1.7 mg/dL — ABNORMAL HIGH (ref 0.50–1.10)
GFR calc Af Amer: 37 mL/min — ABNORMAL LOW (ref >90)
GFR calc non Af Amer: 32 mL/min — ABNORMAL LOW (ref >90)
Glucose, Bld: 153 mg/dL — ABNORMAL HIGH (ref 70–99)
Potassium: 4.1 mEq/L (ref 3.5–5.1)
Sodium: 131 mEq/L — ABNORMAL LOW (ref 135–145)

## 2013-02-01 LAB — GLUCOSE, CAPILLARY
Glucose-Capillary: 132 mg/dL — ABNORMAL HIGH (ref 70–99)
Glucose-Capillary: 155 mg/dL — ABNORMAL HIGH (ref 70–99)
Glucose-Capillary: 160 mg/dL — ABNORMAL HIGH (ref 70–99)
Glucose-Capillary: 165 mg/dL — ABNORMAL HIGH (ref 70–99)
Glucose-Capillary: 193 mg/dL — ABNORMAL HIGH (ref 70–99)

## 2013-02-01 LAB — CBC
HCT: 30.1 % — ABNORMAL LOW (ref 36.0–46.0)
Hemoglobin: 11 g/dL — ABNORMAL LOW (ref 12.0–15.0)
MCH: 33 pg (ref 26.0–34.0)
MCHC: 36.5 g/dL — ABNORMAL HIGH (ref 30.0–36.0)
MCV: 90.4 fL (ref 78.0–100.0)
Platelets: 175 10*3/uL (ref 150–400)
RBC: 3.33 MIL/uL — ABNORMAL LOW (ref 3.87–5.11)
RDW: 14.4 % (ref 11.5–15.5)
WBC: 9.4 10*3/uL (ref 4.0–10.5)

## 2013-02-01 MED ORDER — HEPARIN SODIUM (PORCINE) 5000 UNIT/ML IJ SOLN
5000.0000 [IU] | Freq: Three times a day (TID) | INTRAMUSCULAR | Status: DC
  Administered 2013-02-01 – 2013-02-08 (×23): 5000 [IU] via SUBCUTANEOUS
  Filled 2013-02-01 (×26): qty 1

## 2013-02-01 NOTE — Progress Notes (Signed)
PHARMACIST - PHYSICIAN COMMUNICATION CONCERNING:  METFORMIN SAFE ADMINISTRATION POLICY  DESCRIPTION:  The Pharmacy Committee has adopted a policy that restricts the use of metformin in hospitalized patients until all the contraindications to administration have been ruled out. Specific contraindications are: _0  Serum creatinine ? 1.5 for males _1  Serum creatinine ? 1.4 for females _2  Shock, acute MI, sepsis, hypoxemia, dehydration _3  Planned administration of intravenous iodinated contrast media _4  Heart Failure patients with low EF _5  Acute or chronic metabolic acidosis (including DKA)  ACTION: Metformin has been placed on DISCONTINUE STATUS due to SCr = 1.7.  RECOMMENDATION: Consider resuming metformin when SCr is improved to <1.4.   Clayburn Pert, PharmD, BCPS Pager: [phone] 02/01/2013  6:26 AM

## 2013-02-01 NOTE — Progress Notes (Signed)
3 Days Post-Op lap LAR Subjective: Doing better, urinating well. Pain controlled.  No nausea, ambulating with PT Objective: Vital signs in last 24 hours: Temp:  [99.2 F (37.3 C)-99.5 F (37.5 C)] 99.2 F (37.3 C) (08/22 0518) Pulse Rate:  [86-100] 88 (08/22 0518) Resp:  [15-20] 20 (08/22 0518) BP: (101-148)/(52-88) 140/88 mmHg (08/22 0518) SpO2:  [100 %] 100 % (08/22 0518)   Intake/Output from previous day: 08/21 0701 - 08/22 0700 In: 1429.2 [P.O.:240; I.V.:1189.2] Out: 2979 [Urine:1500; Drains:275] Intake/Output this shift:    General appearance: alert and cooperative GI: normal findings: soft, non-tender JP: serosanguinous output Incision: no significant drainage  Lab Results:   Recent Labs  01/31/13 0415 02/01/13 0433  WBC 9.9 9.4  HGB 12.3 11.0*  HCT 34.1* 30.1*  PLT 190 175   BMET  Recent Labs  01/31/13 0415 02/01/13 0433  NA 136 131*  K 3.8 4.1  CL 100 97  CO2 26 27  GLUCOSE 217* 153*  BUN 15 21  CREATININE 1.14* 1.70*  CALCIUM 9.2 8.9   PT/INR No results found for this basename: LABPROT, INR,  in the last 72 hours ABG No results found for this basename: PHART, PCO2, PO2, HCO3,  in the last 72 hours  MEDS, Scheduled . alvimopan  12 mg Oral BID  . amLODipine  10 mg Oral q morning - 10a  . citalopram  20 mg Oral q morning - 10a  . gabapentin  300 mg Oral BID  . heparin subcutaneous  5,000 Units Subcutaneous Q8H  . insulin aspart  0-15 Units Subcutaneous Q4H  . morphine   Intravenous Q4H  . pantoprazole  40 mg Oral Daily    Studies/Results: No results found.  Assessment: s/p Procedure(s): LAPAROSCOPIC LOW ANTERIOR RESECTION, Rigid Proctoscopy Patient Active Problem List   Diagnosis Date Noted  . Rectal cancer 01/30/2013    Doing well.   Plan: Pt's Cr up, GFR down, uop adequate (Pt has baseline renal insufficiency and baseline cr of 1.3-1.4)- Will increase IVF's to 162m/h, will switch to Hep SQ for dvt prophylaxis, will stop HCTZ  and hold Metformin.  Will recheck labs in AM.  If not resolving by tomorrow with added fluids, may need nephrology consult  Cont PCA for pain control Ambulate TID Will advance diet to fulls     LOS: 3 days     .ARosario Adie MWhite SalmonSurgery, [place]  02/01/2013 7:51 AM

## 2013-02-02 LAB — CBC
HCT: 28.4 % — ABNORMAL LOW (ref 36.0–46.0)
Hemoglobin: 10.3 g/dL — ABNORMAL LOW (ref 12.0–15.0)
MCH: 32.5 pg (ref 26.0–34.0)
MCHC: 36.3 g/dL — ABNORMAL HIGH (ref 30.0–36.0)
MCV: 89.6 fL (ref 78.0–100.0)
Platelets: 192 10*3/uL (ref 150–400)
RBC: 3.17 MIL/uL — ABNORMAL LOW (ref 3.87–5.11)
RDW: 14.2 % (ref 11.5–15.5)
WBC: 7.7 10*3/uL (ref 4.0–10.5)

## 2013-02-02 LAB — BASIC METABOLIC PANEL
BUN: 19 mg/dL (ref 6–23)
CO2: 25 mEq/L (ref 19–32)
Calcium: 8.9 mg/dL (ref 8.4–10.5)
Chloride: 99 mEq/L (ref 96–112)
Creatinine, Ser: 1.26 mg/dL — ABNORMAL HIGH (ref 0.50–1.10)
GFR calc Af Amer: 53 mL/min — ABNORMAL LOW (ref >90)
GFR calc non Af Amer: 46 mL/min — ABNORMAL LOW (ref >90)
Glucose, Bld: 174 mg/dL — ABNORMAL HIGH (ref 70–99)
Potassium: 4.4 mEq/L (ref 3.5–5.1)
Sodium: 131 mEq/L — ABNORMAL LOW (ref 135–145)

## 2013-02-02 LAB — GLUCOSE, CAPILLARY
Glucose-Capillary: 144 mg/dL — ABNORMAL HIGH (ref 70–99)
Glucose-Capillary: 149 mg/dL — ABNORMAL HIGH (ref 70–99)
Glucose-Capillary: 150 mg/dL — ABNORMAL HIGH (ref 70–99)
Glucose-Capillary: 170 mg/dL — ABNORMAL HIGH (ref 70–99)
Glucose-Capillary: 192 mg/dL — ABNORMAL HIGH (ref 70–99)

## 2013-02-02 MED ORDER — METFORMIN HCL 500 MG PO TABS
1000.0000 mg | ORAL_TABLET | Freq: Two times a day (BID) | ORAL | Status: DC
  Administered 2013-02-02 – 2013-02-07 (×11): 1000 mg via ORAL
  Filled 2013-02-02 (×14): qty 2

## 2013-02-02 MED ORDER — INSULIN ASPART 100 UNIT/ML ~~LOC~~ SOLN
0.0000 [IU] | Freq: Three times a day (TID) | SUBCUTANEOUS | Status: DC
Start: 2013-02-03 — End: 2013-02-08
  Administered 2013-02-03 (×2): 2 [IU] via SUBCUTANEOUS
  Administered 2013-02-03: 3 [IU] via SUBCUTANEOUS
  Administered 2013-02-04 – 2013-02-08 (×6): via SUBCUTANEOUS
  Administered 2013-02-08: 2 [IU] via SUBCUTANEOUS

## 2013-02-02 NOTE — Progress Notes (Signed)
Patient ID: Erica [name], female   DOB: [birth year], 57 y.o.   MRN: [id number] 4 Days Post-Op  Subjective: New complaints. Some incisional pain but it is gradually better. She denies nausea. Tolerating full liquids. Would like something to eat. No bowel movements or flatus yet.  Objective: Vital signs in last 24 hours: Temp:  [98.2 F (36.8 C)-99.4 F (37.4 C)] 98.2 F (36.8 C) (08/23 0530) Pulse Rate:  [82-89] 82 (08/23 0530) Resp:  [13-21] 17 (08/23 0530) BP: (115-158)/(78-106) 137/82 mmHg (08/23 0530) SpO2:  [98 %-100 %] 100 % (08/23 0530) Last BM Date: 01/28/13  Intake/Output from previous day: 08/22 0701 - 08/23 0700 In: 2485 [P.O.:240; I.V.:2245] Out: 2185 [Urine:1850; Drains:335] Intake/Output this shift:    General appearance: alert, cooperative and no distress Resp: clear to auscultation bilaterally GI: normal findings: soft, non-tender Incision/Wound: dressing clean and dry. No evidence of infection. JP drainage is serous.  Lab Results:   Recent Labs  02/01/13 0433 02/02/13 0430  WBC 9.4 7.7  HGB 11.0* 10.3*  HCT 30.1* 28.4*  PLT 175 192   BMET  Recent Labs  02/01/13 0433 02/02/13 0430  NA 131* 131*  K 4.1 4.4  CL 97 99  CO2 27 25  GLUCOSE 153* 174*  BUN 21 19  CREATININE 1.70* 1.26*  CALCIUM 8.9 8.9   CBG (last 3)   Recent Labs  02/01/13 1958 02/02/13 0029 02/02/13 0420  GLUCAP 160* 192* 170*      Studies/Results: No results found.  Anti-infectives: Anti-infectives   Start     Dose/Rate Route Frequency Ordered Stop   01/29/13 1800  clindamycin (CLEOCIN) IVPB 900 mg     900 mg 100 mL/hr over 30 Minutes Intravenous One time only - 1800 01/29/13 1423 01/29/13 1900   01/29/13 0600  clindamycin (CLEOCIN) IVPB 900 mg     900 mg 100 mL/hr over 30 Minutes Intravenous On call to O.R. 01/28/13 1609 01/29/13 0713   01/29/13 0600  gentamicin (GARAMYCIN) 300 mg in dextrose 5 % 100 mL IVPB     300 mg 107.5 mL/hr over 60 Minutes Intravenous  On call to O.R. 01/28/13 1609 01/29/13 0731      Assessment/Plan: s/p Procedure(s): LAPAROSCOPIC LOW ANTERIOR RESECTION, Rigid Proctoscopy Doing well without apparent complication. She is hungry and will advance diet. She will start this cautiously. Renal function improved. Very good urine output. Diabetes mellitus. Will restart metformin as renal function improved.   LOS: 4 days    Lindbergh Winkles T 02/02/2013

## 2013-02-03 LAB — CBC
HCT: 28.9 % — ABNORMAL LOW (ref 36.0–46.0)
Hemoglobin: 10.5 g/dL — ABNORMAL LOW (ref 12.0–15.0)
MCH: 32.8 pg (ref 26.0–34.0)
MCHC: 36.3 g/dL — ABNORMAL HIGH (ref 30.0–36.0)
MCV: 90.3 fL (ref 78.0–100.0)
Platelets: 206 10*3/uL (ref 150–400)
RBC: 3.2 MIL/uL — ABNORMAL LOW (ref 3.87–5.11)
RDW: 13.9 % (ref 11.5–15.5)
WBC: 7.9 10*3/uL (ref 4.0–10.5)

## 2013-02-03 LAB — BASIC METABOLIC PANEL
BUN: 15 mg/dL (ref 6–23)
CO2: 26 mEq/L (ref 19–32)
Calcium: 9 mg/dL (ref 8.4–10.5)
Chloride: 98 mEq/L (ref 96–112)
Creatinine, Ser: 1.15 mg/dL — ABNORMAL HIGH (ref 0.50–1.10)
GFR calc Af Amer: 60 mL/min — ABNORMAL LOW (ref >90)
GFR calc non Af Amer: 51 mL/min — ABNORMAL LOW (ref >90)
Glucose, Bld: 174 mg/dL — ABNORMAL HIGH (ref 70–99)
Potassium: 4.4 mEq/L (ref 3.5–5.1)
Sodium: 130 mEq/L — ABNORMAL LOW (ref 135–145)

## 2013-02-03 LAB — GLUCOSE, CAPILLARY
Glucose-Capillary: 174 mg/dL — ABNORMAL HIGH (ref 70–99)
Glucose-Capillary: 145 mg/dL — ABNORMAL HIGH (ref 70–99)
Glucose-Capillary: 137 mg/dL — ABNORMAL HIGH (ref 70–99)
Glucose-Capillary: 120 mg/dL — ABNORMAL HIGH (ref 70–99)

## 2013-02-03 MED ORDER — METOPROLOL TARTRATE 1 MG/ML IV SOLN
5.0000 mg | Freq: Four times a day (QID) | INTRAVENOUS | Status: DC | PRN
  Filled 2013-02-03: qty 5

## 2013-02-03 MED ORDER — OXYCODONE-ACETAMINOPHEN 5-325 MG PO TABS
1.0000 | ORAL_TABLET | ORAL | Status: DC | PRN
  Administered 2013-02-03 (×2): 1 via ORAL
  Administered 2013-02-04: 2 via ORAL
  Administered 2013-02-04: 1 via ORAL
  Administered 2013-02-04 – 2013-02-08 (×22): 2 via ORAL
  Filled 2013-02-03 (×9): qty 2
  Filled 2013-02-03: qty 1
  Filled 2013-02-03 (×5): qty 2
  Filled 2013-02-03: qty 1
  Filled 2013-02-03 (×6): qty 2
  Filled 2013-02-03: qty 1
  Filled 2013-02-03 (×4): qty 2

## 2013-02-03 MED ORDER — HYDROCHLOROTHIAZIDE 12.5 MG PO CAPS
12.5000 mg | ORAL_CAPSULE | Freq: Every morning | ORAL | Status: DC
  Administered 2013-02-03 – 2013-02-08 (×6): 12.5 mg via ORAL
  Filled 2013-02-03 (×7): qty 1

## 2013-02-03 MED ORDER — POTASSIUM CHLORIDE IN NACL 20-0.9 MEQ/L-% IV SOLN
INTRAVENOUS | Status: DC
  Administered 2013-02-03 – 2013-02-05 (×2): 50 mL/h via INTRAVENOUS
  Filled 2013-02-03 (×3): qty 1000

## 2013-02-03 NOTE — Progress Notes (Signed)
Physical Therapy Treatment Patient Details Name: Erica [name] MRN: [id number] DOB: [birth year] Today's Date: 02/03/2013 Time: 1115-1140 PT Time Calculation (min): 25 min  PT Assessment / Plan / Recommendation  History of Present Illness 57 yo female admitted for Laparocopic anterior resection of rectal polyp performed 01/29/13   PT Comments   Progressing slowly with mobility. Still having most difficulty with bed mobility. Possible d/c home on Monday.   Follow Up Recommendations  No PT follow up     Does the patient have the potential to tolerate intense rehabilitation     Barriers to Discharge        Equipment Recommendations  Rolling walker with 5" wheels    Recommendations for Other Services OT consult  Frequency Min 3X/week   Progress towards PT Goals Progress towards PT goals: Progressing toward goals  Plan Current plan remains appropriate    Precautions / Restrictions Precautions Precautions: Fall Precaution Comments: abdominal surgery, drain Restrictions Weight Bearing Restrictions: No   Pertinent Vitals/Pain Abdomen 5/10 with activity. Also groin, perineal area with sitting.     Mobility  Bed Mobility Bed Mobility: Supine to Sit Supine to Sit: HOB elevated;With rails;4: Min assist Details for Bed Mobility Assistance: Increased time. Heavy reliance on bed rail. HOB 60 degrees.  Transfers Transfers: Sit to Stand;Stand to Sit Sit to Stand: 4: Min assist;From bed Stand to Sit: 4: Min assist;To chair/3-in-1 Details for Transfer Assistance: VCs safety, hand placement. assist to rise, stabilize, control descent Ambulation/Gait Ambulation/Gait Assistance: 4: Min guard Ambulation Distance (Feet): 500 Feet Assistive device: Rolling walker Ambulation/Gait Assistance Details: slow gait speed.  Gait Pattern: Step-through pattern;Decreased stride length    Exercises     PT Diagnosis:    PT Problem List:   PT Treatment Interventions:     PT Goals (current goals can  now be found in the care plan section)    Visit Information  Last PT Received On: 02/03/13 Assistance Needed: +1 History of Present Illness: 57 yo female admitted for Laparocopic anterior resection of rectal polyp performed 01/29/13    Subjective Data      Cognition  Cognition Arousal/Alertness: Awake/alert Behavior During Therapy: WFL for tasks assessed/performed Overall Cognitive Status: Within Functional Limits for tasks assessed    Balance     End of Session PT - End of Session Activity Tolerance: Patient limited by pain Patient left: in chair;with call bell/phone within reach   GP     Weston Anna, MPT Pager: [phone]

## 2013-02-03 NOTE — Progress Notes (Addendum)
Patient ID: Erica [name], female   DOB: [birth year], 57 y.o.   MRN: [id number] 5 Days Post-Op  Subjective: No major complaints. Has incisional pain when getting up and down. Tolerating regular diet without nausea. Having flatus but no bowel movements yet  Objective: Vital signs in last 24 hours: Temp:  [98.6 F (37 C)-98.7 F (37.1 C)] 98.6 F (37 C) (08/24 0500) Pulse Rate:  [79-92] 92 (08/24 0500) Resp:  [13-24] 17 (08/24 0500) BP: (132-157)/(92-98) 157/98 mmHg (08/24 0500) SpO2:  [98 %-100 %] 100 % (08/24 0500) Last BM Date: 01/28/13  Intake/Output from previous day: 08/23 0701 - 08/24 0700 In: 2041.7 [P.O.:80; I.V.:1961.7] Out: 2250 [Urine:1950; Drains:300] Intake/Output this shift:    General appearance: alert, cooperative and no distress GI: normal findings: soft, non-tender Incision/Wound: no erythema or drainage. JP drainage is serous.  Lab Results:   Recent Labs  02/02/13 0430 02/03/13 0428  WBC 7.7 7.9  HGB 10.3* 10.5*  HCT 28.4* 28.9*  PLT 192 206   BMET  Recent Labs  02/02/13 0430 02/03/13 0428  NA 131* 130*  K 4.4 4.4  CL 99 98  CO2 25 26  GLUCOSE 174* 174*  BUN 19 15  CREATININE 1.26* 1.15*  CALCIUM 8.9 9.0     Studies/Results: No results found.  Anti-infectives: Anti-infectives   Start     Dose/Rate Route Frequency Ordered Stop   01/29/13 1800  clindamycin (CLEOCIN) IVPB 900 mg     900 mg 100 mL/hr over 30 Minutes Intravenous One time only - 1800 01/29/13 1423 01/29/13 1900   01/29/13 0600  clindamycin (CLEOCIN) IVPB 900 mg     900 mg 100 mL/hr over 30 Minutes Intravenous On call to O.R. 01/28/13 1609 01/29/13 0713   01/29/13 0600  gentamicin (GARAMYCIN) 300 mg in dextrose 5 % 100 mL IVPB     300 mg 107.5 mL/hr over 60 Minutes Intravenous On call to O.R. 01/28/13 1609 01/29/13 0731      Assessment/Plan: s/p Procedure(s): LAPAROSCOPIC LOW ANTERIOR RESECTION, Rigid Proctoscopy Doing well postoperatively without complication  apparent. Renal function has returned to baseline. Hyponatremia. Will decrease IV fluids and change to normal saline Intermittent hypertension. And IV Lopressor when necessary. Try oral pain medication.   LOS: 5 days    Asna Muldrow T 02/03/2013

## 2013-02-04 ENCOUNTER — Telehealth: Payer: Self-pay | Admitting: Oncology

## 2013-02-04 LAB — GLUCOSE, CAPILLARY
Glucose-Capillary: 150 mg/dL — ABNORMAL HIGH (ref 70–99)
Glucose-Capillary: 120 mg/dL — ABNORMAL HIGH (ref 70–99)
Glucose-Capillary: 122 mg/dL — ABNORMAL HIGH (ref 70–99)
Glucose-Capillary: 115 mg/dL — ABNORMAL HIGH (ref 70–99)
Glucose-Capillary: 113 mg/dL — ABNORMAL HIGH (ref 70–99)

## 2013-02-04 NOTE — Progress Notes (Signed)
Pt refused morning labs.  

## 2013-02-04 NOTE — Progress Notes (Signed)
PT Cancellation Note  Patient Details Name: Erica [name] MRN: [id number] DOB: 03-02-1956   Cancelled Treatment:    Reason Eval/Treat Not Completed: Medical issues which prohibited therapy (pt getting up with nursing, states not able to walk  at the )   Claretha Cooper 02/04/2013, 3:54 PM Tresa Endo PT [phone]

## 2013-02-04 NOTE — Progress Notes (Signed)
No change in assessment. Will continue current plan of care. Stacey Drain

## 2013-02-04 NOTE — Progress Notes (Signed)
Patient ID: Erica [name], female   DOB: 01-13-1956, 57 y.o.   MRN: [id number] 6 Days Post-Op  Subjective: No major complaints.Tolerating regular diet with some nausea and gets full after only a few bites.  Having flatus but no bowel movements yet.    Objective: Vital signs in last 24 hours: Temp:  [97.4 F (36.3 C)-99.4 F (37.4 C)] 99.4 F (37.4 C) (08/25 0537) Pulse Rate:  [78-87] 87 (08/25 0537) Resp:  [14-18] 18 (08/25 0823) BP: (143-155)/(84-92) 143/85 mmHg (08/25 0537) SpO2:  [100 %] 100 % (08/25 0823) FiO2 (%):  [39 %] 39 % (08/25 0823) Last BM Date: 01/28/13  Intake/Output from previous day: 08/24 0701 - 08/25 0700 In: 1260.8 [P.O.:160; I.V.:1100.8] Out: 2450 [Urine:2250; Drains:200] Intake/Output this shift: Total I/O In: -  Out: 430 [Urine:400; Drains:30]  General appearance: alert, cooperative and no distress GI: normal findings: soft, non-tender Incision/Wound: no erythema or drainage. JP drainage is serous.  Lab Results:   Recent Labs  02/02/13 0430 02/03/13 0428  WBC 7.7 7.9  HGB 10.3* 10.5*  HCT 28.4* 28.9*  PLT 192 206   BMET  Recent Labs  02/02/13 0430 02/03/13 0428  NA 131* 130*  K 4.4 4.4  CL 99 98  CO2 25 26  GLUCOSE 174* 174*  BUN 19 15  CREATININE 1.26* 1.15*  CALCIUM 8.9 9.0     Studies/Results: No results found.  Anti-infectives: Anti-infectives   Start     Dose/Rate Route Frequency Ordered Stop   01/29/13 1800  clindamycin (CLEOCIN) IVPB 900 mg     900 mg 100 mL/hr over 30 Minutes Intravenous One time only - 1800 01/29/13 1423 01/29/13 1900   01/29/13 0600  clindamycin (CLEOCIN) IVPB 900 mg     900 mg 100 mL/hr over 30 Minutes Intravenous On call to O.R. 01/28/13 1609 01/29/13 0713   01/29/13 0600  gentamicin (GARAMYCIN) 300 mg in dextrose 5 % 100 mL IVPB     300 mg 107.5 mL/hr over 60 Minutes Intravenous On call to O.R. 01/28/13 1609 01/29/13 0731      Assessment/Plan: s/p Procedure(s): LAPAROSCOPIC LOW ANTERIOR  RESECTION, Rigid Proctoscopy Doing well postoperatively without complication apparent, although seems to have a bit of an ileus Renal function has returned to baseline. Hyponatremia.Cont normal saline Intermittent hypertension. And IV Lopressor when necessary. Try oral pain medication. Cont diet as tolerated Cont ambulation   LOS: 6 days    Erica [name] C. 3/84/5364

## 2013-02-04 NOTE — Telephone Encounter (Signed)
C/D 02/04/13 for appt. 02/21/13

## 2013-02-05 LAB — CREATININE, SERUM
Creatinine, Ser: 1.23 mg/dL — ABNORMAL HIGH (ref 0.50–1.10)
GFR calc Af Amer: 55 mL/min — ABNORMAL LOW (ref >90)
GFR calc non Af Amer: 47 mL/min — ABNORMAL LOW (ref >90)

## 2013-02-05 LAB — GLUCOSE, CAPILLARY
Glucose-Capillary: 123 mg/dL — ABNORMAL HIGH (ref 70–99)
Glucose-Capillary: 128 mg/dL — ABNORMAL HIGH (ref 70–99)
Glucose-Capillary: 117 mg/dL — ABNORMAL HIGH (ref 70–99)
Glucose-Capillary: 118 mg/dL — ABNORMAL HIGH (ref 70–99)

## 2013-02-05 NOTE — Progress Notes (Signed)
Patient ID: Erica [name], female   DOB: 06/22/1955, 57 y.o.   MRN: [id number] 7 Days Post-Op  Subjective: No major complaints.Tolerating regular diet better.  Having flatus but no bowel movements yet.    Objective: Vital signs in last 24 hours: Temp:  [98.9 F (37.2 C)-99 F (37.2 C)] 98.9 F (37.2 C) (08/26 0600) Pulse Rate:  [94-102] 102 (08/26 0600) Resp:  [18] 18 (08/26 0600) BP: (126-145)/(81-90) 145/83 mmHg (08/26 0600) SpO2:  [100 %] 100 % (08/26 0600) Last BM Date: 01/28/13  Intake/Output from previous day: 08/25 0701 - 08/26 0700 In: 1474.5 [P.O.:240; I.V.:1234.5] Out: 0891 [Urine:3050; Drains:140] Intake/Output this shift:    General appearance: alert, cooperative and no distress GI: normal findings: soft, non-tender Incision/Wound: no erythema or drainage. JP drainage is serous.  Lab Results:   Recent Labs  02/03/13 0428  WBC 7.9  HGB 10.5*  HCT 28.9*  PLT 206   BMET  Recent Labs  02/03/13 0428 02/05/13 0418  NA 130*  --   K 4.4  --   CL 98  --   CO2 26  --   GLUCOSE 174*  --   BUN 15  --   CREATININE 1.15* 1.23*  CALCIUM 9.0  --      Studies/Results: No results found.  Anti-infectives: Anti-infectives   Start     Dose/Rate Route Frequency Ordered Stop   01/29/13 1800  clindamycin (CLEOCIN) IVPB 900 mg     900 mg 100 mL/hr over 30 Minutes Intravenous One time only - 1800 01/29/13 1423 01/29/13 1900   01/29/13 0600  clindamycin (CLEOCIN) IVPB 900 mg     900 mg 100 mL/hr over 30 Minutes Intravenous On call to O.R. 01/28/13 1609 01/29/13 0713   01/29/13 0600  gentamicin (GARAMYCIN) 300 mg in dextrose 5 % 100 mL IVPB     300 mg 107.5 mL/hr over 60 Minutes Intravenous On call to O.R. 01/28/13 1609 01/29/13 0731      Assessment/Plan: s/p Procedure(s): LAPAROSCOPIC LOW ANTERIOR RESECTION, Rigid Proctoscopy Doing well postoperatively without complication apparent, seems that ileus is resolving Renal function has returned to  baseline. Intermittent hypertension. IV Lopressor when necessary. Cont oral pain medication. Cont diet as tolerated Cont ambulation Cont JP until BM Hopefully home later today or tom   LOS: 7 days    [place], [name] 0/07/6283

## 2013-02-05 NOTE — Progress Notes (Signed)
Physical Therapy Treatment Patient Details Name: Erica [name] MRN: [id number] DOB: 1956-01-29 Today's Date: 02/05/2013 Time: 5894-8347 PT Time Calculation (min): 15 min  PT Assessment / Plan / Recommendation  History of Present Illness 57 yo female admitted for Laparocopic anterior resection of rectal polyp performed 01/29/13   PT Comments   Pt reports feeling dizzy and in pain which limited ambulation today. Pt states she will walk again later.  Follow Up Recommendations  No PT follow up     Does the patient have the potential to tolerate intense rehabilitation     Barriers to Discharge        Equipment Recommendations  Rolling walker with 5" wheels    Recommendations for Other Services    Frequency Min 3X/week   Progress towards PT Goals Progress towards PT goals: Progressing toward goals  Plan Current plan remains appropriate    Precautions / Restrictions Precautions Precautions: Fall Precaution Comments: abdominal surgery, drain   Pertinent Vitals/Pain Reports abdominal pain, not rated.    Mobility  Bed Mobility Supine to Sit: 6: Modified independent (Device/Increase time) Details for Bed Mobility Assistance: Pt getting up and heading to bathroom independently. Cautioned pt to call for assist due to c/o feeling dizzy. Transfers Sit to Stand: 6: Modified independent (Device/Increase time) Stand to Sit: 6: Modified independent (Device/Increase time) Ambulation/Gait Ambulation/Gait Assistance: 4: Min guard Ambulation Distance (Feet): 80 Feet Assistive device: Rolling walker Ambulation/Gait Assistance Details: Up in room without RW, noted to hold onto bed, doorframe, etc. Gait Pattern: Step-through pattern General Gait Details: limited by pain in abdomen    Exercises     PT Diagnosis:    PT Problem List:   PT Treatment Interventions:     PT Goals (current goals can now be found in the care plan section)    Visit Information  Last PT Received On:  02/05/13 Assistance Needed: +1 History of Present Illness: 57 yo female admitted for Laparocopic anterior resection of rectal polyp performed 01/29/13    Subjective Data      Cognition  Cognition Arousal/Alertness: Awake/alert Behavior During Therapy: [facility] for tasks assessed/performed    Balance     End of Session PT - End of Session Activity Tolerance: Patient limited by fatigue (c/o dizziness.) Patient left: in chair;with call bell/phone within reach   GP     Claretha Cooper 02/05/2013, 11:47 AM Tresa Endo PT [phone]

## 2013-02-06 ENCOUNTER — Inpatient Hospital Stay (HOSPITAL_COMMUNITY): Payer: Medicare Other

## 2013-02-06 LAB — GLUCOSE, CAPILLARY
Glucose-Capillary: 99 mg/dL (ref 70–99)
Glucose-Capillary: 108 mg/dL — ABNORMAL HIGH (ref 70–99)
Glucose-Capillary: 130 mg/dL — ABNORMAL HIGH (ref 70–99)
Glucose-Capillary: 144 mg/dL — ABNORMAL HIGH (ref 70–99)

## 2013-02-06 IMAGING — CR DG ABDOMEN ACUTE W/ 1V CHEST
3 series · 3 of 3 positions shown · non-contrast
Comparison: Chest radiograph [DATE]

CLINICAL DATA: Abdominal pain, diabetes, hypertension, asthma,
history GERD

ACUTE ABDOMEN SERIES (ABDOMEN 2 VIEW & CHEST 1 VIEW)

[w chest pa]
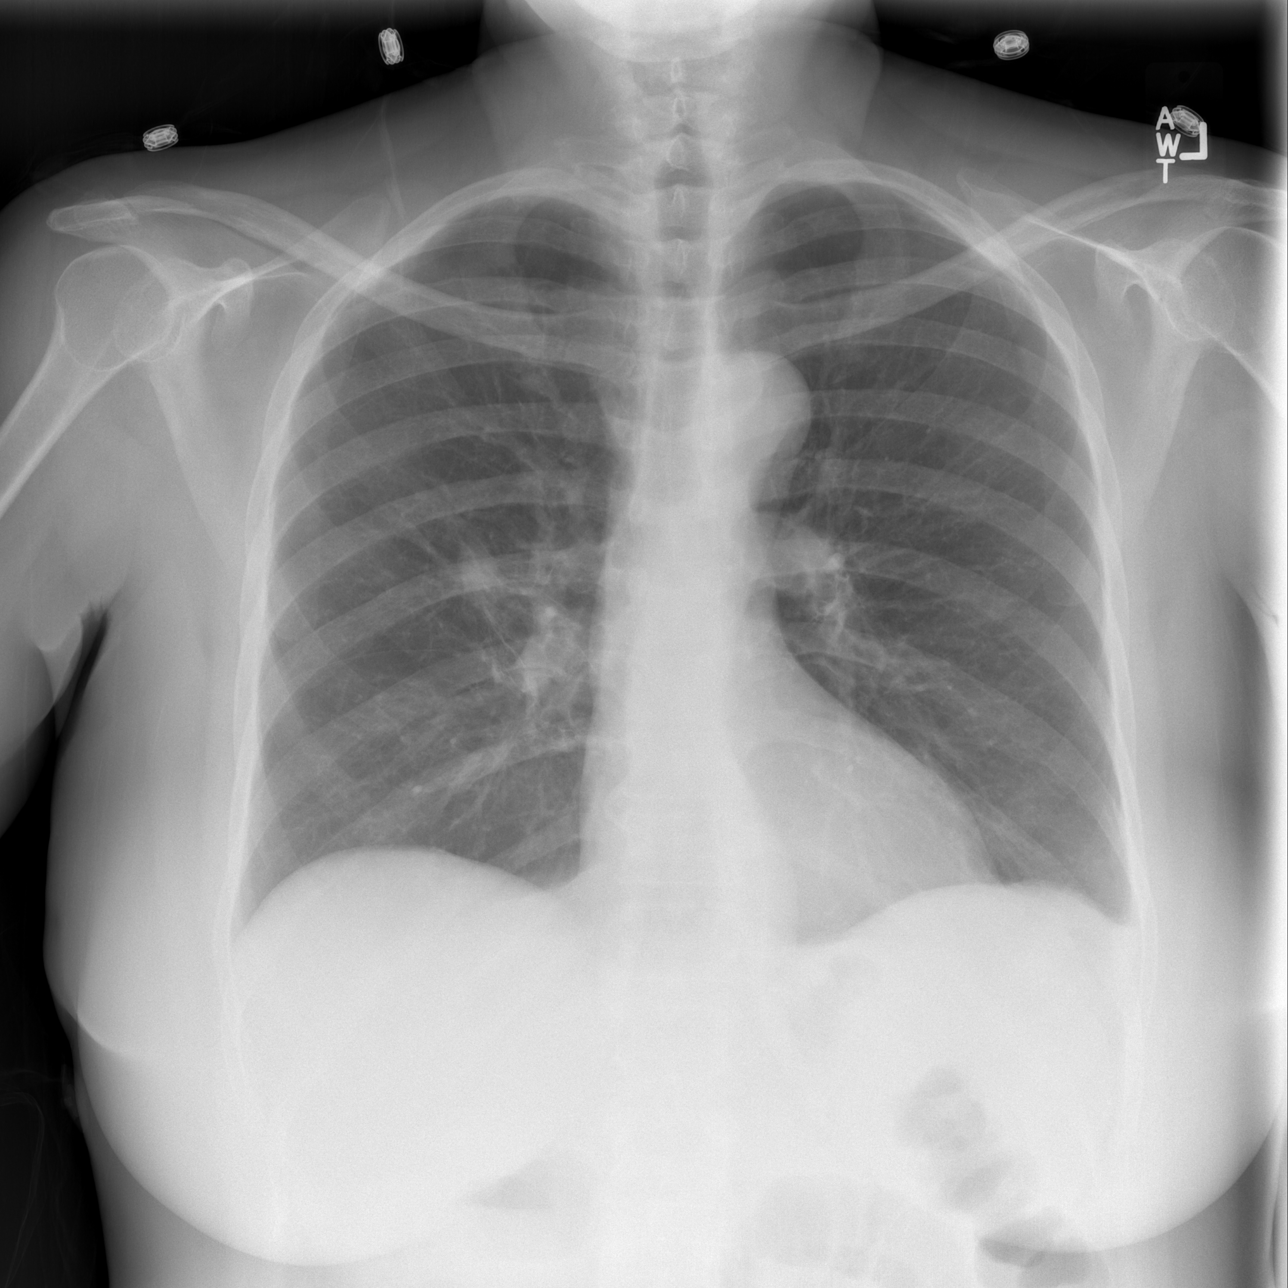

[w abdomen upright *]
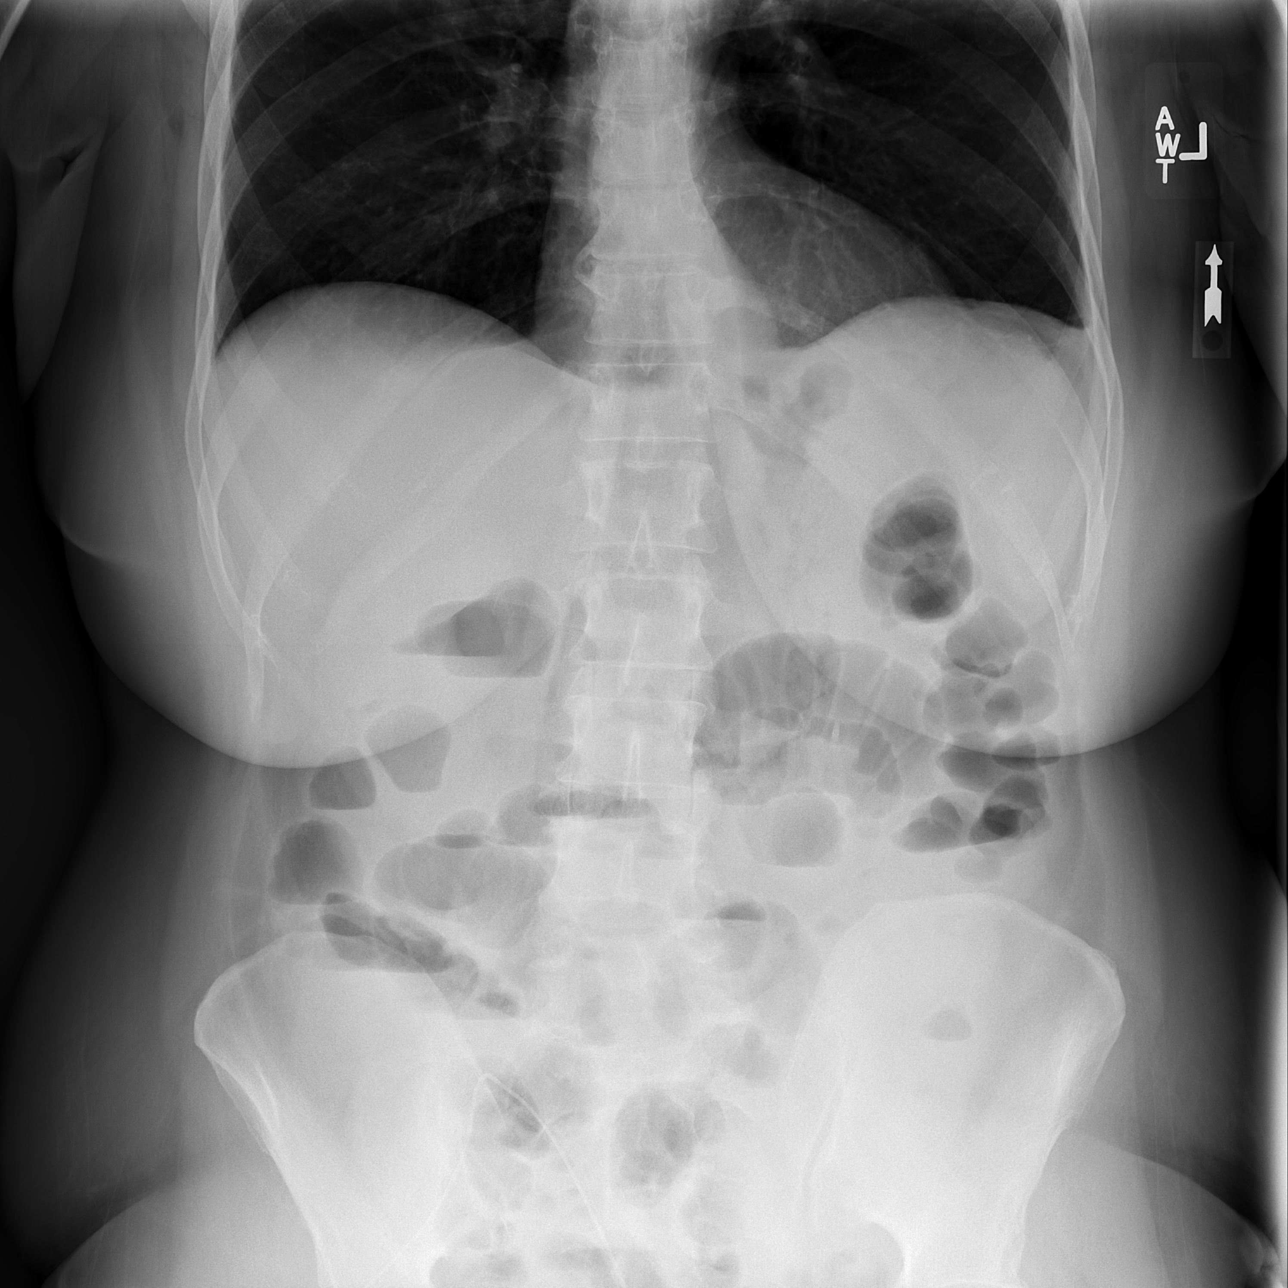

[t abdomen supine]
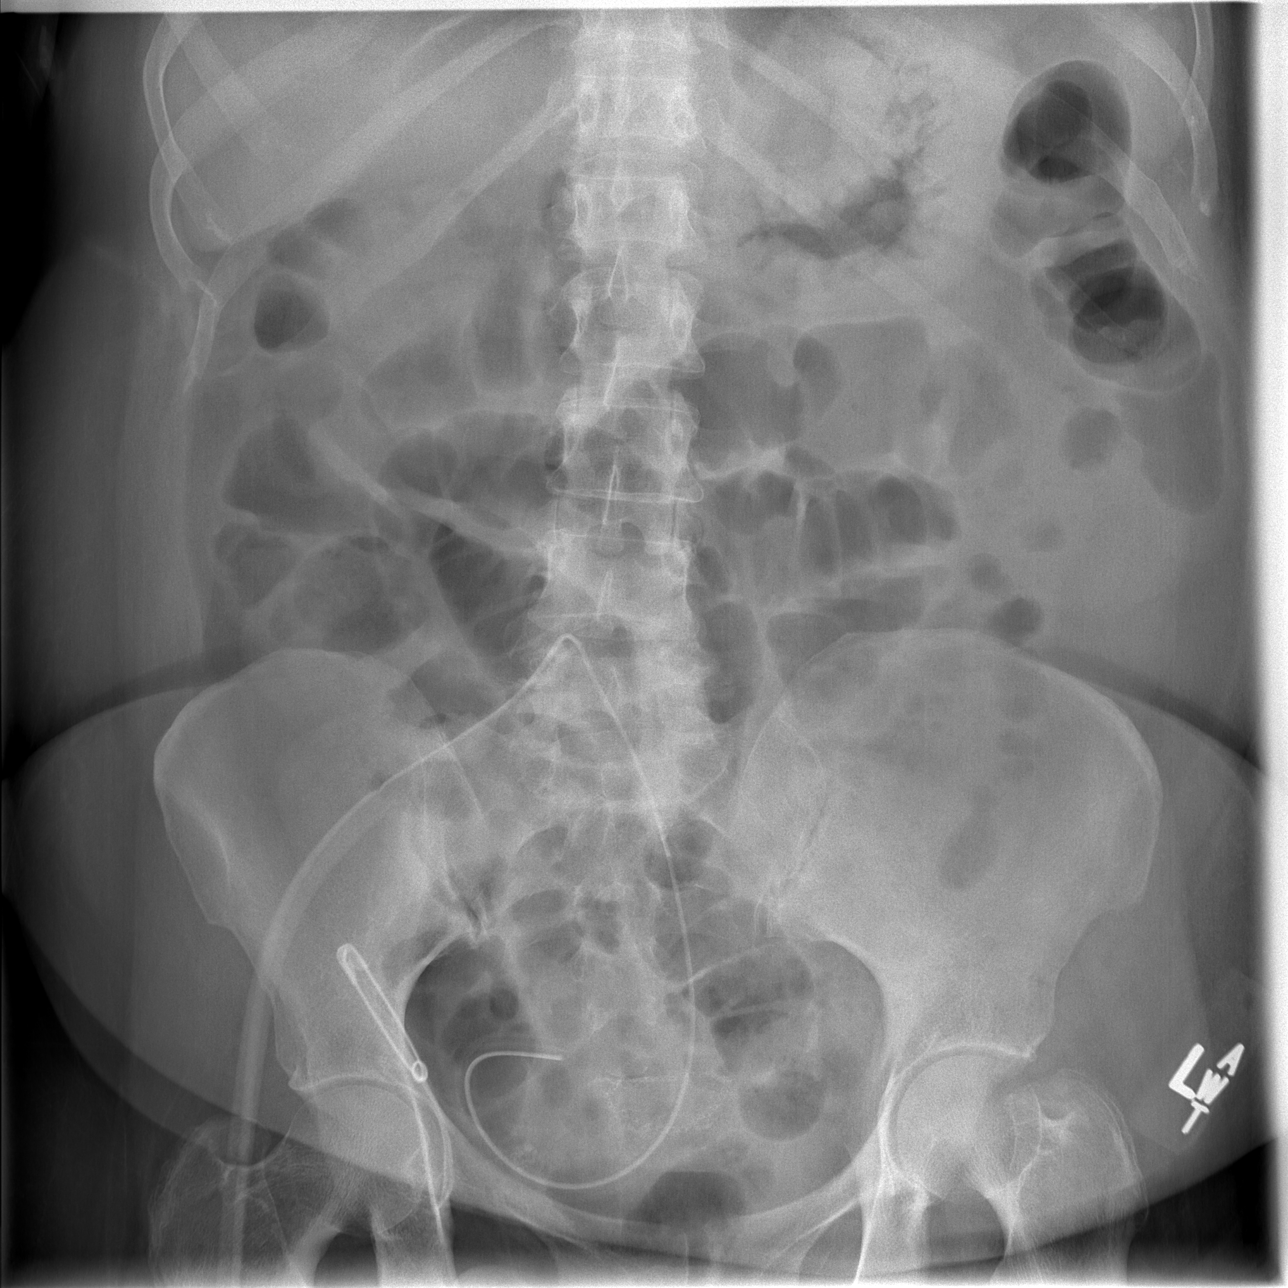

[3 of 3 positions shown; findings below may reference images not displayed]

FINDINGS: Normal heart size and pulmonary vascularity.
Tortuous thoracic aorta.
New right perihilar nodular opacity 2.3 x 1.4 cm diameter cannot
exclude nodule.
Suspect small left pleural effusion.
No pneumothorax.
Catheter in pelvis.
Air filled loops of small bowel the mid abdomen, minimally
prominent, though gas is present throughout the colon.
No definite evidence of obstruction, wall thickening or free
intraperitoneal air.
Bones unremarkable.
No urinary tract calcification.
IMPRESSION: Nonspecific bowel gas pattern.
Question minimal left pleural effusion.
New right perihilar nodular density 2.3 x 1.4 cm in size, cannot
exclude developing pulmonary mass/nodule; CT chest with contrast
recommended to exclude neoplasm.

## 2013-02-06 MED ORDER — KCL IN DEXTROSE-NACL 10-5-0.45 MEQ/L-%-% IV SOLN
INTRAVENOUS | Status: DC
  Administered 2013-02-06 – 2013-02-07 (×2): via INTRAVENOUS
  Administered 2013-02-07: 20 mL/h via INTRAVENOUS
  Administered 2013-02-08: 04:00:00 via INTRAVENOUS
  Filled 2013-02-06 (×3): qty 1000

## 2013-02-06 MED ORDER — PROMETHAZINE HCL 25 MG/ML IJ SOLN: 12.5000 mg | Freq: Four times a day (QID) | INTRAMUSCULAR | Status: DC | PRN

## 2013-02-06 MED ORDER — KETOROLAC TROMETHAMINE 15 MG/ML IJ SOLN
15.0000 mg | Freq: Once | INTRAMUSCULAR | Status: AC
  Administered 2013-02-06: 15 mg via INTRAVENOUS
  Filled 2013-02-06: qty 1

## 2013-02-06 MED ORDER — MORPHINE SULFATE 2 MG/ML IJ SOLN: 0.5000 mg | INTRAMUSCULAR | Status: DC | PRN

## 2013-02-06 NOTE — Progress Notes (Signed)
Discussed with Dr.Martin via telephone that patient states her po percocet are not holding her pain,  She says about "2 hours and then I am in pain again".  Discussed that patient has had BM today but still having intermittent nausea.  IV medications are ordered in addition to po percocet for breakthrough pain control as well as IV antiemetics.

## 2013-02-06 NOTE — Progress Notes (Signed)
Patient ID: TAYLORE HINDE, female   DOB: 02-01-1956, 57 y.o.   MRN: [id number] 8 Days Post-Op  Subjective: No major complaints.  Having more nausea.  Having flatus but no bowel movements yet.  Having more lower back and abdominal pain.     Objective: Vital signs in last 24 hours: Temp:  [98.9 F (37.2 C)-99.5 F (37.5 C)] 98.9 F (37.2 C) (08/27 0618) Pulse Rate:  [100-103] 102 (08/27 0618) Resp:  [16-18] 16 (08/27 0618) BP: (124-140)/(68-85) 124/68 mmHg (08/27 0618) SpO2:  [95 %-100 %] 95 % (08/27 0618) Last BM Date: 01/28/13  Intake/Output from previous day: 08/26 0701 - 08/27 0700 In: 440 [P.O.:240; I.V.:200] Out: 510 [Urine:400; Drains:110] Intake/Output this shift: Total I/O In: 60 [P.O.:60] Out: 10 [Drains:10]  General appearance: alert, cooperative and no distress GI: normal findings: soft, non-tender Incision/Wound: no erythema or drainage. JP drainage is serous.  Lab Results:  No results found for this basename: WBC, HGB, HCT, PLT,  in the last 72 hours BMET  Recent Labs  02/05/13 0418  CREATININE 1.23*     Studies/Results: No results found.  Anti-infectives: Anti-infectives   Start     Dose/Rate Route Frequency Ordered Stop   01/29/13 1800  clindamycin (CLEOCIN) IVPB 900 mg     900 mg 100 mL/hr over 30 Minutes Intravenous One time only - 1800 01/29/13 1423 01/29/13 1900   01/29/13 0600  clindamycin (CLEOCIN) IVPB 900 mg     900 mg 100 mL/hr over 30 Minutes Intravenous On call to O.R. 01/28/13 1609 01/29/13 0713   01/29/13 0600  gentamicin (GARAMYCIN) 300 mg in dextrose 5 % 100 mL IVPB     300 mg 107.5 mL/hr over 60 Minutes Intravenous On call to O.R. 01/28/13 1609 01/29/13 0731      Assessment/Plan: s/p Procedure(s): LAPAROSCOPIC LOW ANTERIOR RESECTION, Rigid Proctoscopy Post op ileus Renal function has returned to baseline. Intermittent hypertension. IV Lopressor when necessary. Cont oral pain medication. Cont diet as tolerated Cont  ambulation Cont JP until BM Will get AAS to evaluate abdomen.  Will try miralax if no signs of obstruction.  Will repeat labs and possibly get a CT in AM if pain/constipation doesn't get better with miralax    LOS: 8 days    Avaleigh Decuir C. 08/17/3541

## 2013-02-07 ENCOUNTER — Other Ambulatory Visit (HOSPITAL_COMMUNITY): Payer: Medicare Other

## 2013-02-07 LAB — BASIC METABOLIC PANEL
BUN: 24 mg/dL — ABNORMAL HIGH (ref 6–23)
CO2: 25 mEq/L (ref 19–32)
Calcium: 8.2 mg/dL — ABNORMAL LOW (ref 8.4–10.5)
Chloride: 95 mEq/L — ABNORMAL LOW (ref 96–112)
Creatinine, Ser: 1.51 mg/dL — ABNORMAL HIGH (ref 0.50–1.10)
GFR calc Af Amer: 43 mL/min — ABNORMAL LOW (ref >90)
GFR calc non Af Amer: 37 mL/min — ABNORMAL LOW (ref >90)
Glucose, Bld: 161 mg/dL — ABNORMAL HIGH (ref 70–99)
Potassium: 3.6 mEq/L (ref 3.5–5.1)
Sodium: 128 mEq/L — ABNORMAL LOW (ref 135–145)

## 2013-02-07 LAB — CBC
HCT: 26.1 % — ABNORMAL LOW (ref 36.0–46.0)
Hemoglobin: 9.7 g/dL — ABNORMAL LOW (ref 12.0–15.0)
MCH: 32.9 pg (ref 26.0–34.0)
MCHC: 37.2 g/dL — ABNORMAL HIGH (ref 30.0–36.0)
MCV: 88.5 fL (ref 78.0–100.0)
Platelets: 225 10*3/uL (ref 150–400)
RBC: 2.95 MIL/uL — ABNORMAL LOW (ref 3.87–5.11)
RDW: 14.1 % (ref 11.5–15.5)
WBC: 14.4 10*3/uL — ABNORMAL HIGH (ref 4.0–10.5)

## 2013-02-07 LAB — GLUCOSE, CAPILLARY
Glucose-Capillary: 133 mg/dL — ABNORMAL HIGH (ref 70–99)
Glucose-Capillary: 101 mg/dL — ABNORMAL HIGH (ref 70–99)
Glucose-Capillary: 106 mg/dL — ABNORMAL HIGH (ref 70–99)

## 2013-02-07 MED ORDER — SODIUM CHLORIDE 0.9 % IV BOLUS (SEPSIS)
500.0000 mL | Freq: Once | INTRAVENOUS | Status: AC
  Administered 2013-02-07: 500 mL via INTRAVENOUS

## 2013-02-07 MED ORDER — CIPROFLOXACIN HCL 500 MG PO TABS
500.0000 mg | ORAL_TABLET | Freq: Two times a day (BID) | ORAL | Status: DC
  Administered 2013-02-07 – 2013-02-08 (×3): 500 mg via ORAL
  Filled 2013-02-07 (×5): qty 1

## 2013-02-07 MED ORDER — IOHEXOL 300 MG/ML  SOLN: 25.0000 mL | INTRAMUSCULAR | Status: AC

## 2013-02-07 MED ORDER — METRONIDAZOLE 500 MG PO TABS
500.0000 mg | ORAL_TABLET | Freq: Three times a day (TID) | ORAL | Status: DC
  Administered 2013-02-07 – 2013-02-08 (×4): 500 mg via ORAL
  Filled 2013-02-07 (×6): qty 1

## 2013-02-07 NOTE — Progress Notes (Signed)
Patient received x1 dose of IV Toradol and po Percocet for pain on 7-11 shift.  Patient states pain at a comfortable level.  Denies nausea.  Resting in bed with eyes closed

## 2013-02-07 NOTE — Progress Notes (Signed)
Physical Therapy Treatment Patient Details Name: Erica [name] MRN: [id number] DOB: Aug 30, 1955 Today's Date: 02/07/2013 Time: 1510-1530 PT Time Calculation (min): 20 min  PT Assessment / Plan / Recommendation  History of Present Illness 57 yo female admitted for Laparocopic anterior resection of rectal polyp performed 01/29/13   PT Comments   Pt tolerated longer ambulation today. Encouraged pt to walk more if feeling up to it.  Follow Up Recommendations  No PT follow up     Does the patient have the potential to tolerate intense rehabilitation     Barriers to Discharge        Equipment Recommendations  Rolling walker with 5" wheels    Recommendations for Other Services    Frequency Min 3X/week   Progress towards PT Goals Progress towards PT goals: Progressing toward goals  Plan Current plan remains appropriate    Precautions / Restrictions Precautions Precaution Comments: abdominal surgery,    Pertinent Vitals/Pain abd 6 had meds.    Mobility  Transfers Sit to Stand: 6: Modified independent (Device/Increase time) Stand to Sit: 6: Modified independent (Device/Increase time) Ambulation/Gait Ambulation/Gait Assistance: 4: Min guard Ambulation Distance (Feet): 400 Feet Assistive device: Rolling walker Ambulation/Gait Assistance Details: stopped x 3 for rest, Gait Pattern: Step-through pattern General Gait Details: limited by pain in abdomen    Exercises     PT Diagnosis:    PT Problem List:   PT Treatment Interventions:     PT Goals (current goals can now be found in the care plan section)    Visit Information  Last PT Received On: 02/07/13 History of Present Illness: 57 yo female admitted for Laparocopic anterior resection of rectal polyp performed 01/29/13    Subjective Data      Cognition  Cognition Arousal/Alertness: Awake/alert    Balance     End of Session PT - End of Session Activity Tolerance: Patient limited by pain Patient left:  (in bathroom)    GP     Claretha Cooper 02/07/2013, 3:39 PM

## 2013-02-07 NOTE — Progress Notes (Signed)
PHARMACIST - PHYSICIAN COMMUNICATION DR:  Marcello Moores CONCERNING:  METFORMIN SAFE ADMINISTRATION POLICY  RECOMMENDATION: Metformin has been placed on DISCONTINUE (rejected order) STATUS and should be reordered only after any of the conditions below are ruled out.  Current safety recommendations include avoiding metformin for a minimum of 48 hours after the patient's exposure to intravenous contrast media.  DESCRIPTION:  The Pharmacy Committee has adopted a policy that restricts the use of metformin in hospitalized patients until all the contraindications to administration have been ruled out. Specific contraindications are: _0  Serum creatinine ? 1.5 for males _1  Serum creatinine ? 1.4 for females _2  Shock, acute MI, sepsis, hypoxemia, dehydration _3  Planned administration of intravenous iodinated contrast media _4  Heart Failure patients with low EF _5  Acute or chronic metabolic acidosis (including DKA)     Current SCr 1.51, and levels have been quite variable during this admission. Will monitor SCr closely and resume Metformin when appropriate. Currently on Novolog sliding scale.  Thank you  Minda Ditto PharmD Pager [phone] 02/07/2013, 2:45 PM

## 2013-02-07 NOTE — Progress Notes (Signed)
Patient called out in panic stated she was bleeding. Went to room and noticed foul smelling red-pus like drainage from incision. Cleaned patient up, dressed incision, reassured patient. Notified MD of patient and family concerns. Will continue to monitor.

## 2013-02-07 NOTE — Progress Notes (Signed)
Patient ID: Erica [name], female   DOB: Oct 19, 1955, 57 y.o.   MRN: [id number] 9 Days Post-Op  Subjective: Stated she had several BM's last night.  This morning she felt better but began to have drainage from her wound.  Objective: Vital signs in last 24 hours: Temp:  [98.6 F (37 C)-99.2 F (37.3 C)] 98.6 F (37 C) (08/28 0526) Pulse Rate:  [96-116] 96 (08/28 0954) Resp:  [18] 18 (08/28 0526) BP: (111-153)/(77-90) 111/86 mmHg (08/28 0954) SpO2:  [100 %] 100 % (08/28 0526) Last BM Date: 02/07/13  Intake/Output from previous day: 08/27 0701 - 08/28 0700 In: 7939 [P.O.:300; I.V.:1015] Out: 6886 [Urine:1200; Drains:55] Intake/Output this shift: Total I/O In: 240 [P.O.:240] Out: -   General appearance: alert, cooperative and no distress GI: normal findings: soft, non-tender Incision/Wound: no erythema, purulent drainage. JP drainage is serous.  Lab Results:   Recent Labs  02/07/13 0447  WBC 14.4*  HGB 9.7*  HCT 26.1*  PLT 225   BMET  Recent Labs  02/05/13 0418 02/07/13 0447  NA  --  128*  K  --  3.6  CL  --  95*  CO2  --  25  GLUCOSE  --  161*  BUN  --  24*  CREATININE 1.23* 1.51*  CALCIUM  --  8.2*     Studies/Results: Dg Abd Acute W/chest  02/06/2013   *RADIOLOGY REPORT*  Clinical Data: Abdominal pain, diabetes, hypertension, asthma, history GERD  ACUTE ABDOMEN SERIES (ABDOMEN 2 VIEW & CHEST 1 VIEW)  Comparison: Chest radiograph 11/06/2012  Findings: Normal heart size and pulmonary vascularity. Tortuous thoracic aorta. New right perihilar nodular opacity 2.3 x 1.4 cm diameter cannot exclude nodule. Suspect small left pleural effusion. No pneumothorax. Catheter in pelvis. Air filled loops of small bowel the mid abdomen, minimally prominent, though gas is present throughout the colon. No definite evidence of obstruction, wall thickening or free intraperitoneal air. Bones unremarkable. No urinary tract calcification.  IMPRESSION: Nonspecific bowel gas pattern.  Question minimal left pleural effusion. New right perihilar nodular density 2.3 x 1.4 cm in size, cannot exclude developing pulmonary mass/nodule; CT chest with contrast recommended to exclude neoplasm.   Original Report Authenticated By: Lavonia Dana, M.D.    Anti-infectives: Anti-infectives   Start     Dose/Rate Route Frequency Ordered Stop   01/29/13 1800  clindamycin (CLEOCIN) IVPB 900 mg     900 mg 100 mL/hr over 30 Minutes Intravenous One time only - 1800 01/29/13 1423 01/29/13 1900   01/29/13 0600  clindamycin (CLEOCIN) IVPB 900 mg     900 mg 100 mL/hr over 30 Minutes Intravenous On call to O.R. 01/28/13 1609 01/29/13 0713   01/29/13 0600  gentamicin (GARAMYCIN) 300 mg in dextrose 5 % 100 mL IVPB     300 mg 107.5 mL/hr over 60 Minutes Intravenous On call to O.R. 01/28/13 1609 01/29/13 0731      Assessment/Plan: s/p Procedure(s): LAPAROSCOPIC LOW ANTERIOR RESECTION, Rigid Proctoscopy Post op ileus resolving Renal function slightly elevated from baseline, 529m bolus given  Intermittent hypertension. IV Lopressor when necessary. Cont oral pain medication. Cont diet as tolerated Cont ambulation D/C JP Wound infection: wound opened and packed, cultures sent, will place on 10 day course of cipro and flagyl   LOS: 9 days    Haidynn Almendarez C. 84/84/7207

## 2013-02-08 LAB — CBC
HCT: 25.9 % — ABNORMAL LOW (ref 36.0–46.0)
Hemoglobin: 9.5 g/dL — ABNORMAL LOW (ref 12.0–15.0)
MCH: 32.2 pg (ref 26.0–34.0)
MCHC: 36.7 g/dL — ABNORMAL HIGH (ref 30.0–36.0)
MCV: 87.8 fL (ref 78.0–100.0)
Platelets: 252 10*3/uL (ref 150–400)
RBC: 2.95 MIL/uL — ABNORMAL LOW (ref 3.87–5.11)
RDW: 14.2 % (ref 11.5–15.5)
WBC: 11 10*3/uL — ABNORMAL HIGH (ref 4.0–10.5)

## 2013-02-08 LAB — GLUCOSE, CAPILLARY
Glucose-Capillary: 118 mg/dL — ABNORMAL HIGH (ref 70–99)
Glucose-Capillary: 126 mg/dL — ABNORMAL HIGH (ref 70–99)
Glucose-Capillary: 120 mg/dL — ABNORMAL HIGH (ref 70–99)
Glucose-Capillary: 128 mg/dL — ABNORMAL HIGH (ref 70–99)

## 2013-02-08 LAB — BASIC METABOLIC PANEL
BUN: 19 mg/dL (ref 6–23)
CO2: 24 mEq/L (ref 19–32)
Calcium: 8.6 mg/dL (ref 8.4–10.5)
Chloride: 102 mEq/L (ref 96–112)
Creatinine, Ser: 1.19 mg/dL — ABNORMAL HIGH (ref 0.50–1.10)
GFR calc Af Amer: 57 mL/min — ABNORMAL LOW (ref >90)
GFR calc non Af Amer: 49 mL/min — ABNORMAL LOW (ref >90)
Glucose, Bld: 155 mg/dL — ABNORMAL HIGH (ref 70–99)
Potassium: 3.6 mEq/L (ref 3.5–5.1)
Sodium: 133 mEq/L — ABNORMAL LOW (ref 135–145)

## 2013-02-08 MED ORDER — CIPROFLOXACIN HCL 500 MG PO TABS: 500.0000 mg | ORAL_TABLET | Freq: Two times a day (BID) | ORAL | Status: AC

## 2013-02-08 MED ORDER — METRONIDAZOLE 500 MG PO TABS: 500.0000 mg | ORAL_TABLET | Freq: Three times a day (TID) | ORAL | Status: DC

## 2013-02-08 MED ORDER — CIPROFLOXACIN HCL 500 MG PO TABS: 500.0000 mg | ORAL_TABLET | Freq: Two times a day (BID) | ORAL | Status: DC

## 2013-02-08 MED ORDER — OXYCODONE-ACETAMINOPHEN 5-325 MG PO TABS: 1.0000 | ORAL_TABLET | ORAL | Status: DC | PRN

## 2013-02-08 MED ORDER — METRONIDAZOLE 500 MG PO TABS: 500.0000 mg | ORAL_TABLET | Freq: Three times a day (TID) | ORAL | Status: AC

## 2013-02-08 NOTE — Progress Notes (Signed)
Assessment unchanged. Pt and sister verbalized understanding of dc instructions through teach back. Familiar with My Chart with plans to sign up. Home Health arranged by Case Manager with [facility]. Pt's sister works medically and understands how and observed dressing change this am. Dressing change to be done daily and HHRN will be in home to continue teaching with pt and sister. Scripts x 1 given as provided by MD. Dressing supplies provided. Discharged via wc to front entrance to meet awaiting vehicle to carry home. Accompanied by sister and NT.

## 2013-02-08 NOTE — Progress Notes (Signed)
Patient refused labs. Janeth Rase, RN

## 2013-02-08 NOTE — Discharge Instructions (Signed)
WOUND CARE  It is important that the wound be kept open.   -Keeping the skin edges apart will allow the wound to gradually heal from the base upwards.   - If the skin edges of the wound close too early, a new fluid pocket can form and infection can occur. -This is the reason to pack deeper wounds with gauze or ribbon -This is why drained wounds cannot be sewed closed right away  A healthy wound should form a lining of bright red "beefy" granulating tissue that will help shrink the wound and help the edges grow new skin into it.   -A little mucus / yellow discharge is normal (the body's natural way to try and form a scab) and should be gently washed off with soap and water with daily dressing changes.  -Green or foul smelling drainage implies bacterial colonization and can slow wound healing - a short course of antibiotic ointment (3-5 days) can help it clear up.  Call the doctor if it does not improve or worsens  -Avoid use of antibiotic ointments for more than a week as they can slow wound healing over time.    -Sometimes other wound care products will be used to reduce need for dressing changes and/or help clean up dirty wounds -Sometimes the surgeon needs to debride the wound in the office to remove dead or infected tissue out of the wound so it can heal more quickly and safely.    Change the dressing at least once a day -Wash the wound with mild soap and water gently every day.  It is good to shower or bathe the wound to help it clean out. -Use clean 4x4 gauze for medium/large wounds or ribbon plain NU-gauze for smaller wounds (it does not need to be sterile, just clean) -Keep the raw wound moist with a little saline or KY (saline) gel on the gauze.  -A dry wound will take longer to heal.  -Keep the skin dry around the wound to prevent breakdown and irritation. -Pack the wound down to the base -The goal is to keep the skin apart, not overpack the wound -Use a Q-tip or blunt-tipped kabob  stick toothpick to push the gauze down to the base in narrow or deep wounds   -Cover with a clean gauze and tape -paper or Medipore tape tend to be gentle on the skin -rotate the orientation of the tape to avoid repeated stress/trauma on the skin -using an ACE or Coban wrap on wounds on arms or legs can be used instead.  Complete all antibiotics through the entire prescription to help the infection heal and prevent new places of infection   Returning the see the surgeon is helpful to follow the healing process and help the wound close as fast as possible.   

## 2013-02-08 NOTE — Discharge Summary (Signed)
Doing much better after wound opened.  Labs much better as well.  Sister is an Therapist, sports, who is going to help her with her wound.

## 2013-02-08 NOTE — Discharge Summary (Signed)
Physician Discharge Summary  Erica [name] GYJ:[id number] DOB: July 12, 1955 DOA: 01/29/2013  PCP: Philis Fendt, MD  Consultation: none  Admit date: 01/29/2013 Discharge date: 02/08/2013  Recommendations for Outpatient Follow-up:  1. HH for dressing changes.  Her sister who is a retired Marine scientist will assist with the need dressing changes  Follow-up Information   Follow up with Rosario Adie., MD. Schedule an appointment as soon as possible for a visit in 3 weeks.   Specialty:  General Surgery   Contact information:   [address] [place] [address] [phone]      Discharge Diagnoses:  1. Adenocarcinoma of colon, stage I 2. Post operative ileus 3. High blood pressure 4. Acute renal insufficiency 5. Wound infection   Surgical Procedure: Laparoscopic low anterior resection, rigid proctoscopy(Dr. Marcello Moores 01/29/13)  Discharge Condition: stable Disposition: home  Diet recommendation: bland, low fiber diet for 2 weeks.  Then regular diet may be resumed.    Filed Weights   01/29/13 1530  Weight: 176 lb (79.833 kg)     Hospital Course:  Erica [name] is a 57 year old female with a history of diabetes mellitus, HTN, GERD and depression who was found to have a rectal polyp following a colonoscopy.  The biopsy showed a high grade dysplasia which subsequently resulted in a laparoscopic lower anterior resection on 01/29/13.  She did not have any intraoperative complications.  She was transferred to the floor.  She developed renal insufficiency on POD #3, she was managed with IVFs, avoiding nephrotoxins which continued to improve and normalized on POD #6.   She had intermittent hypertension which was treated with IV metoprolol PRN.  Her diet was advanced, but she developed an ileus which subsequently resolved.  Her JP drain was DC'd.  She developed a wound site infection and started on Cipro/Flagyl, she will continue this for an additional 6 days along with NS wet to dry  dressing changes for which home health was consulted.  On POD #10 she was tolerating a diet, pain was under adequate control with oral medication, having BMs and VSS.  She was therefore felt stable for discharge.     Discharge Instructions   Future Appointments Provider Department Dept Phone   5/88/5027 74:12 AM Leighton Ruff, MD [facility], [place] [phone]       Medication List         albuterol 108 (90 BASE) MCG/ACT inhaler  Commonly known as:  PROVENTIL HFA;VENTOLIN HFA  Inhale 2 puffs into the lungs every 4 (four) hours as needed for wheezing.     amLODipine 10 MG tablet  Commonly known as:  NORVASC  Take 10 mg by mouth every morning.     baclofen 10 MG tablet  Commonly known as:  LIORESAL  Take 10 mg by mouth daily as needed (for stomach).     ciprofloxacin 500 MG tablet  Commonly known as:  CIPRO  Take 1 tablet (500 mg total) by mouth 2 (two) times daily.     citalopram 20 MG tablet  Commonly known as:  CELEXA  Take 20 mg by mouth every morning.     gabapentin 300 MG capsule  Commonly known as:  NEURONTIN  Take 300 mg by mouth 2 (two) times daily.     hydrochlorothiazide 12.5 MG capsule  Commonly known as:  MICROZIDE  Take 12.5 mg by mouth every morning.     metFORMIN 1000 MG tablet  Commonly known as:  GLUCOPHAGE  Take 1,000  mg by mouth 2 (two) times daily with a meal.     metroNIDAZOLE 500 MG tablet  Commonly known as:  FLAGYL  Take 1 tablet (500 mg total) by mouth every 8 (eight) hours.     oxyCODONE-acetaminophen 5-325 MG per tablet  Commonly known as:  PERCOCET/ROXICET  Take 1-2 tablets by mouth every 4 (four) hours as needed.     pantoprazole 40 MG tablet  Commonly known as:  PROTONIX  Take 40 mg by mouth daily.     traMADol 50 MG tablet  Commonly known as:  ULTRAM  Take 50 mg by mouth every 6 (six) hours as needed for pain.           Follow-up Information   Follow up with Rosario Adie., MD. Schedule an appointment as  soon as possible for a visit in 3 weeks.   Specialty:  General Surgery   Contact information:   [address] [place] [address] [phone]        The results of significant diagnostics from this hospitalization (including imaging, microbiology, ancillary and laboratory) are listed below for reference.    Significant Diagnostic Studies: Dg Abd Acute W/chest  02/06/2013   *RADIOLOGY REPORT*  Clinical Data: Abdominal pain, diabetes, hypertension, asthma, history GERD  ACUTE ABDOMEN SERIES (ABDOMEN 2 VIEW & CHEST 1 VIEW)  Comparison: Chest radiograph 11/06/2012  Findings: Normal heart size and pulmonary vascularity. Tortuous thoracic aorta. New right perihilar nodular opacity 2.3 x 1.4 cm diameter cannot exclude nodule. Suspect small left pleural effusion. No pneumothorax. Catheter in pelvis. Air filled loops of small bowel the mid abdomen, minimally prominent, though gas is present throughout the colon. No definite evidence of obstruction, wall thickening or free intraperitoneal air. Bones unremarkable. No urinary tract calcification.  IMPRESSION: Nonspecific bowel gas pattern. Question minimal left pleural effusion. New right perihilar nodular density 2.3 x 1.4 cm in size, cannot exclude developing pulmonary mass/nodule; CT chest with contrast recommended to exclude neoplasm.   Original Report Authenticated By: Lavonia Dana, M.D.    Microbiology: Recent Results (from the past 240 hour(s))  WOUND CULTURE     Status: None   Collection Time    02/07/13 10:59 AM      Result Value Range Status   Specimen Description WOUND   Final   Special Requests Normal   Final   Gram Stain     Final   Value: ABUNDANT WBC PRESENT, PREDOMINANTLY PMN     NO SQUAMOUS EPITHELIAL CELLS SEEN     FEW GRAM POSITIVE COCCI IN PAIRS     Performed at [facility]   Culture     Final   Value: NO GROWTH 1 DAY     Performed at [facility]   Report Status PENDING   Incomplete      Labs: Basic Metabolic Panel:  Recent Labs Lab 02/02/13 0430 02/03/13 0428 02/05/13 0418 02/07/13 0447 02/08/13 0845  NA 131* 130*  --  128* 133*  K 4.4 4.4  --  3.6 3.6  CL 99 98  --  95* 102  CO2 25 26  --  25 24  GLUCOSE 174* 174*  --  161* 155*  BUN 19 15  --  24* 19  CREATININE 1.26* 1.15* 1.23* 1.51* 1.19*  CALCIUM 8.9 9.0  --  8.2* 8.6   CBC:  Recent Labs Lab 02/02/13 0430 02/03/13 0428 02/07/13 0447 02/08/13 0845  WBC 7.7 7.9 14.4* 11.0*  HGB 10.3*  10.5* 9.7* 9.5*  HCT 28.4* 28.9* 26.1* 25.9*  MCV 89.6 90.3 88.5 87.8  PLT 192 206 225 252   CBG:  Recent Labs Lab 02/07/13 1136 02/07/13 1513 02/07/13 2132 02/08/13 0718 02/08/13 1144  GLUCAP 101* 106* 118* 126* 120*    Principal Problem:   Rectal cancer   Time coordinating discharge: 30 mins  Signed:  Edna Rede, ANP-BC

## 2013-02-08 NOTE — Progress Notes (Signed)
Physical Therapy Treatment Patient Details Name: Erica [name] MRN: [id number] DOB: 1956-03-26 Today's Date: 02/08/2013 Time: 0920-0940 PT Time Calculation (min): 20 min  PT Assessment / Plan / Recommendation  History of Present Illness Pt improving. hopes to go home this weekend   PT Comments   Pt is able to walk moderate distances with RW independently and is able to go up and down stairs.  She is ready to d/c to home from PT standpoint  Follow Up Recommendations  No PT follow up     Does the patient have the potential to tolerate intense rehabilitation     Barriers to Discharge        Equipment Recommendations  Rolling walker with 5" wheels    Recommendations for Other Services    Frequency     Progress towards PT Goals Progress towards PT goals: Progressing toward goals  Plan Current plan remains appropriate    Precautions / Restrictions     Pertinent Vitals/Pain C/o increased pain at end of walk    Mobility  Bed Mobility Supine to Sit: 6: Modified independent (Device/Increase time);7: Independent Transfers Transfers: Sit to Stand;Stand to Sit Sit to Stand: 7: Independent Stand to Sit: 7: Independent Details for Transfer Assistance: pt uses care to descend into sitting Ambulation/Gait Ambulation/Gait Assistance: 6: Modified independent (Device/Increase time) Ambulation Distance (Feet): 200 Feet Assistive device: Rolling walker Ambulation/Gait Assistance Details: safety Gait Pattern: Step-through pattern Gait velocity: slowed down as pt fatigues General Gait Details: pt is improving in amulation tolerance, but still is limited by fatigue Stairs: Yes Stairs Assistance: 6: Modified independent (Device/Increase time) Stair Management Technique: One rail Right Number of Stairs: 3    Exercises Other Exercises Other Exercises: pt educated to do small bouts of activity througout the day  Other Exercises: standing with core activation and UE ROM    PT Diagnosis:     PT Problem List:   PT Treatment Interventions:     PT Goals (current goals can now be found in the care plan section)    Visit Information  Last PT Received On: 02/08/13 Assistance Needed: +1 History of Present Illness: Pt improving. hopes to go home this weekend    Subjective Data      Cognition  Cognition Arousal/Alertness: Awake/alert Behavior During Therapy: WFL for tasks assessed/performed Overall Cognitive Status: Within Functional Limits for tasks assessed    Balance  Static Sitting Balance Static Sitting - Balance Support: No upper extremity supported Static Sitting - Level of Assistance: 7: Independent Static Standing Balance Static Standing - Balance Support: No upper extremity supported Static Standing - Level of Assistance: 7: Independent  End of Session PT - End of Session Activity Tolerance: Patient tolerated treatment well Patient left: in chair   GP   Helene Kelp K. Owens Shark, [place] 02/08/2013, 9:49 AM

## 2013-02-09 LAB — WOUND CULTURE
Culture: NO GROWTH
Special Requests: NORMAL

## 2013-02-15 ENCOUNTER — Telehealth (INDEPENDENT_AMBULATORY_CARE_PROVIDER_SITE_OTHER): Payer: Self-pay | Admitting: General Surgery

## 2013-02-15 NOTE — Telephone Encounter (Signed)
Pt called to report a new pain on the Lt side (only) of her incision, describing it as "like a bee sting."  It comes about every 30 minutes or so.  She denies fever and states the wound is healing well.  Discussed with Dr. Marcello Moores, who states it is probable nerve irritation.  Recommended scheduled NSAIDs and okay to use ice on it.  Called pt with this recommendation and she understands to take ibuprofen 400-600 mg Q6H around the clock for 48-72 hours, then prn, in addition to whatever narcotic pain reliever she needs.  Pt will call back next week if no improvement or worsening.

## 2013-02-18 ENCOUNTER — Encounter (INDEPENDENT_AMBULATORY_CARE_PROVIDER_SITE_OTHER): Payer: Self-pay

## 2013-02-18 ENCOUNTER — Telehealth (INDEPENDENT_AMBULATORY_CARE_PROVIDER_SITE_OTHER): Payer: Self-pay

## 2013-02-18 NOTE — Telephone Encounter (Signed)
Patient ask if she should continue to pack her incision site. I advised her she should continue to do the packing  As much is allowed due to healing. She has P/o appt 02-26-13   With DR. Marcello Moores

## 2013-02-21 ENCOUNTER — Ambulatory Visit: Payer: Medicare Other | Admitting: Oncology

## 2013-02-21 ENCOUNTER — Ambulatory Visit: Payer: Medicare Other

## 2013-02-26 ENCOUNTER — Ambulatory Visit (INDEPENDENT_AMBULATORY_CARE_PROVIDER_SITE_OTHER): Payer: Medicare Other | Admitting: General Surgery

## 2013-02-26 ENCOUNTER — Encounter (INDEPENDENT_AMBULATORY_CARE_PROVIDER_SITE_OTHER): Payer: Self-pay | Admitting: General Surgery

## 2013-02-26 VITALS — BP 120/78 | HR 90 | Temp 98.4°F | Resp 18 | Ht 63.0 in | Wt 168.0 lb

## 2013-02-26 DIAGNOSIS — Z5189 Encounter for other specified aftercare: Secondary | ICD-10-CM

## 2013-02-26 DIAGNOSIS — T814XXD Infection following a procedure, subsequent encounter: Secondary | ICD-10-CM

## 2013-02-26 MED ORDER — OXYCODONE-ACETAMINOPHEN 5-325 MG PO TABS: 1.0000 | ORAL_TABLET | ORAL | Status: DC | PRN

## 2013-02-26 NOTE — Progress Notes (Signed)
Erica [name] is a 57 y.o. female who is status post a LAR on 8/19.  This was complicated by a wound infection.  She is packing this daily.  She is having some urgency but otherwise is doing well.  She is still having some pain laterally from her incision.    Objective: Filed Vitals:   02/26/13 1004  BP: 120/78  Pulse: 90  Temp: 98.4 F (36.9 C)  Resp: 18    General appearance: alert and cooperative GI: soft, non-tender; bowel sounds normal; no masses,  no organomegaly  Incision: healing well, good granulation tissue   Assessment: s/p  Patient Active Problem List   Diagnosis Date Noted  . Rectal cancer 01/30/2013   Stage 1 rectal cancer  Plan: Doing well.  Refilled narcotics.  She will try to wean as tolerated.  Cont packing.  RTO in 3-4 wks    .Rosario Adie, [facility], [place]   02/26/2013 10:18 AM

## 2013-02-26 NOTE — Patient Instructions (Signed)
Continue packing daily.  Increase activity slowly.  No heavy lifting for 4 more weeks.

## 2013-03-01 ENCOUNTER — Telehealth (INDEPENDENT_AMBULATORY_CARE_PROVIDER_SITE_OTHER): Payer: Self-pay | Admitting: General Surgery

## 2013-03-01 NOTE — Telephone Encounter (Signed)
Pt called for advice on gas and no BM in last several days.  Explained she should be walking AMAP to help with gas.  She states she has been walking and would like to try OTC meds for gas.  Stated that would be okay to try.  Also asked about using Miralax to help promote regular BM.  Stated that would be okay as well, and to mix a capful into 10-12 oz of fluid; okay to use it BID until she becomes regular.

## 2013-03-06 ENCOUNTER — Encounter (HOSPITAL_COMMUNITY): Payer: Self-pay

## 2013-03-21 ENCOUNTER — Ambulatory Visit (INDEPENDENT_AMBULATORY_CARE_PROVIDER_SITE_OTHER): Payer: Medicare Other | Admitting: General Surgery

## 2013-03-21 ENCOUNTER — Encounter (INDEPENDENT_AMBULATORY_CARE_PROVIDER_SITE_OTHER): Payer: Self-pay | Admitting: General Surgery

## 2013-03-21 ENCOUNTER — Encounter (INDEPENDENT_AMBULATORY_CARE_PROVIDER_SITE_OTHER): Payer: Self-pay

## 2013-03-21 VITALS — BP 122/78 | HR 84 | Temp 98.4°F | Resp 15 | Ht 63.0 in | Wt 169.0 lb

## 2013-03-21 DIAGNOSIS — Z9889 Other specified postprocedural states: Secondary | ICD-10-CM

## 2013-03-21 NOTE — Patient Instructions (Signed)
Continue packing wound daily

## 2013-03-21 NOTE — Progress Notes (Signed)
Erica [name] is a 57 y.o. female who is status post a LAR on 8/19. This was complicated by a wound infection. She is packing this daily.  Her BM's are getting better.  Her pain is getting better.  Objective:  Filed Vitals:   03/21/13 1647  BP: 122/78  Pulse: 84  Temp: 98.4 F (36.9 C)  Resp: 15    General appearance: alert and cooperative  GI: soft, non-tender; bowel sounds normal; no masses, no organomegaly  Incision: healing well, good granulation tissue   Assessment:  s/p  Patient Active Problem List    Diagnosis  Date Noted   .  Rectal cancer  01/30/2013   Stage 1 rectal cancer  Plan:  Doing well. Will transition to non narcotic pain relievers.  Cont packing. RTO in 3-4 wks

## 2013-04-09 ENCOUNTER — Telehealth (INDEPENDENT_AMBULATORY_CARE_PROVIDER_SITE_OTHER): Payer: Self-pay | Admitting: *Deleted

## 2013-04-09 NOTE — Telephone Encounter (Signed)
Erica [name] with [facility] called to ask for approval to continue wound care.  She states that patient is not comfortable with her own dressing changes yet and they also give patient supplies.  Explained that I would send Dr. Marcello Moores a message to ask then we would give her a call back to update her.  Erica [name] states understanding at this time.

## 2013-04-10 NOTE — Telephone Encounter (Signed)
That's fine.  I signed something for that yesterday, I believe.

## 2013-04-10 NOTE — Telephone Encounter (Signed)
Called and spoke to Erica [name] to update her on below message from Dr. Marcello Moores.  She states understanding at this time.

## 2013-04-12 ENCOUNTER — Ambulatory Visit (INDEPENDENT_AMBULATORY_CARE_PROVIDER_SITE_OTHER): Payer: Medicare Other | Admitting: General Surgery

## 2013-04-12 ENCOUNTER — Encounter (INDEPENDENT_AMBULATORY_CARE_PROVIDER_SITE_OTHER): Payer: Self-pay | Admitting: General Surgery

## 2013-04-12 VITALS — BP 138/88 | HR 92 | Temp 98.0°F | Resp 15 | Ht 63.0 in | Wt 169.8 lb

## 2013-04-12 DIAGNOSIS — R109 Unspecified abdominal pain: Secondary | ICD-10-CM

## 2013-04-12 NOTE — Patient Instructions (Signed)
Keep your scheduled f/u apt

## 2013-04-12 NOTE — Progress Notes (Signed)
Erica [name] is a 57 y.o. female who is status post a LAR on 8/19. This was complicated by a wound infection. She is packing this daily.  She presents today with an increase in abd pain. She states that this is periumbilical and describes it as a sharp, shooting pain.  She denies nausea or constipation.  She relates the pain to movement.    Objective:  Filed Vitals:   04/12/13 1610  BP: 138/88  Pulse: 92  Temp: 98 F (36.7 C)  Resp: 15   General appearance: alert and cooperative  GI: soft, non-tender; bowel sounds normal; no masses, no organomegaly  Incision: healing well, good granulation tissue   Assessment:  s/p  Patient Active Problem List    Diagnosis  Date Noted   .  Rectal cancer  01/30/2013   Stage 1 rectal cancer   Plan:  Sounds most like nerve pain.  She will cont the ibuprofen.  I will see her back on 11/10.  If she is still having symptoms then, we may order a CT to investigate further.

## 2013-04-22 ENCOUNTER — Ambulatory Visit (INDEPENDENT_AMBULATORY_CARE_PROVIDER_SITE_OTHER): Payer: Medicare Other | Admitting: General Surgery

## 2013-04-22 ENCOUNTER — Encounter (INDEPENDENT_AMBULATORY_CARE_PROVIDER_SITE_OTHER): Payer: Self-pay | Admitting: General Surgery

## 2013-04-22 VITALS — BP 140/86 | HR 92 | Temp 98.1°F | Resp 15 | Ht 63.0 in | Wt 169.8 lb

## 2013-04-22 DIAGNOSIS — C20 Malignant neoplasm of rectum: Secondary | ICD-10-CM

## 2013-04-22 NOTE — Patient Instructions (Signed)
Return to the office in 3 months.  Stop packing and cover with a dry dressing until no drainage on dressing.  We will have you get a CEA lab as well.

## 2013-04-22 NOTE — Progress Notes (Signed)
Erica [name] is a 57 y.o. female who is here for a follow up visit regarding her stage 1 rectal cancer.  She is s/p LAR in late Aug 2014.  This was complicated by a wound infection.  She has been packing this daily.    Objective: Filed Vitals:   04/22/13 1024  BP: 140/86  Pulse: 92  Temp: 98.1 F (36.7 C)  Resp: 15    General appearance: alert and cooperative GI: soft, non-tender; bowel sounds normal; no masses,  no organomegaly wound: healing well  Path:  1. Colon, segmental resection, rectosigmoid - INVASIVE WELL DIFFERENTIATED ADENOCARCINOMA ARISING IN BACKGROUND OF TUBULOVILLOUS ADENOMA, INVADING INTO THE SUBMUCOSA. - NO EVIDENCE OF ANGIOLYMPHATIC INVASION IDENTIFIED. - EIGHT LYMPH NODES, NEGATIVE FOR METASTATIC CARCINOMA (0/8 ), PLEASE SEE COMMENT - MULTIPLE SERRATED POLYPS/ADENOMA. - RESECTION MARGINS, NEGATIVE FOR ATYPIA OR MALIGNANCY. 2. Colon, resection margin (donut), anastomotic rings - BENIGN COLONIC MUCOSA, NO EVIDENCE OF MALIGNANCY.  Lab Results  Component Value Date   CEA 3.8 11/06/2012     Assessment and Plan: Erica [name] is doing well.  She is 3 mo s/p resection of her stage 1 rectal cancer.  I will get a CEA level today.  I will see her back in 4 months.      Rosario Adie, [facility], [place]

## 2013-05-18 LAB — CEA: CEA: 3.9 ng/mL (ref 0.0–5.0)

## 2013-05-20 ENCOUNTER — Telehealth (INDEPENDENT_AMBULATORY_CARE_PROVIDER_SITE_OTHER): Payer: Self-pay

## 2013-05-20 NOTE — Telephone Encounter (Signed)
Called and notified the pt of her CEA results.

## 2013-05-20 NOTE — Telephone Encounter (Signed)
Message copied by Gweneth Fritter on Mon May 20, 2013  4:36 PM ------      Message from: [place], Gifford Shave.      Created: Mon May 20, 2013  8:38 AM      Regarding: please notify pt       CEA level is stable.  Thanks            AT      ----- Message -----         From: [facility] Interface         Sent: 05/18/2013   1:21 AM           To: Leighton Ruff, MD                   ------

## 2013-05-22 ENCOUNTER — Other Ambulatory Visit (HOSPITAL_COMMUNITY): Payer: Self-pay | Admitting: Internal Medicine

## 2013-05-22 DIAGNOSIS — Z1231 Encounter for screening mammogram for malignant neoplasm of breast: Secondary | ICD-10-CM

## 2013-05-23 ENCOUNTER — Ambulatory Visit (HOSPITAL_COMMUNITY)
Admission: RE | Admit: 2013-05-23 | Discharge: 2013-05-23 | Disposition: A | Discharge status: Home / Self Care | Payer: Medicare Other | Source: Ambulatory Visit | Attending: Internal Medicine | Admitting: Internal Medicine

## 2013-05-23 DIAGNOSIS — Z1231 Encounter for screening mammogram for malignant neoplasm of breast: Secondary | ICD-10-CM

## 2013-05-23 IMAGING — MG MM 3D TOMO
6 of 10 series · 6 of 26 positions shown · non-contrast
Comparison: Previous exam(s)

ACR Breast Density Category a: The breast tissue is almost entirely
fatty.

CLINICAL DATA: Screening.

EXAM:
DIGITAL SCREENING BILATERAL MAMMOGRAM WITH 3D TOMO WITH CAD
DIGITAL BREAST TOMOSYNTHESIS
Digital breast tomosynthesis images are acquired in two projections.
These images are reviewed in combination with the digital mammogram,
confirming the findings below.

[R MLO (1 of 2)]
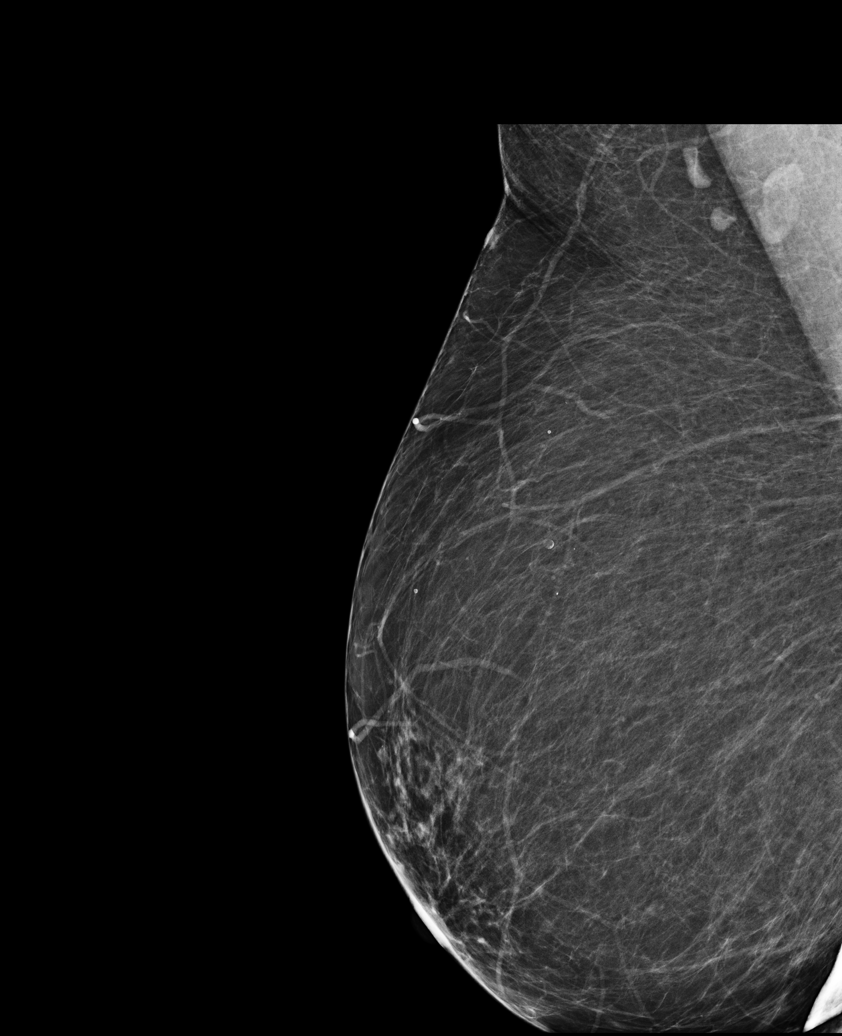

[L MLO (1 of 2)]
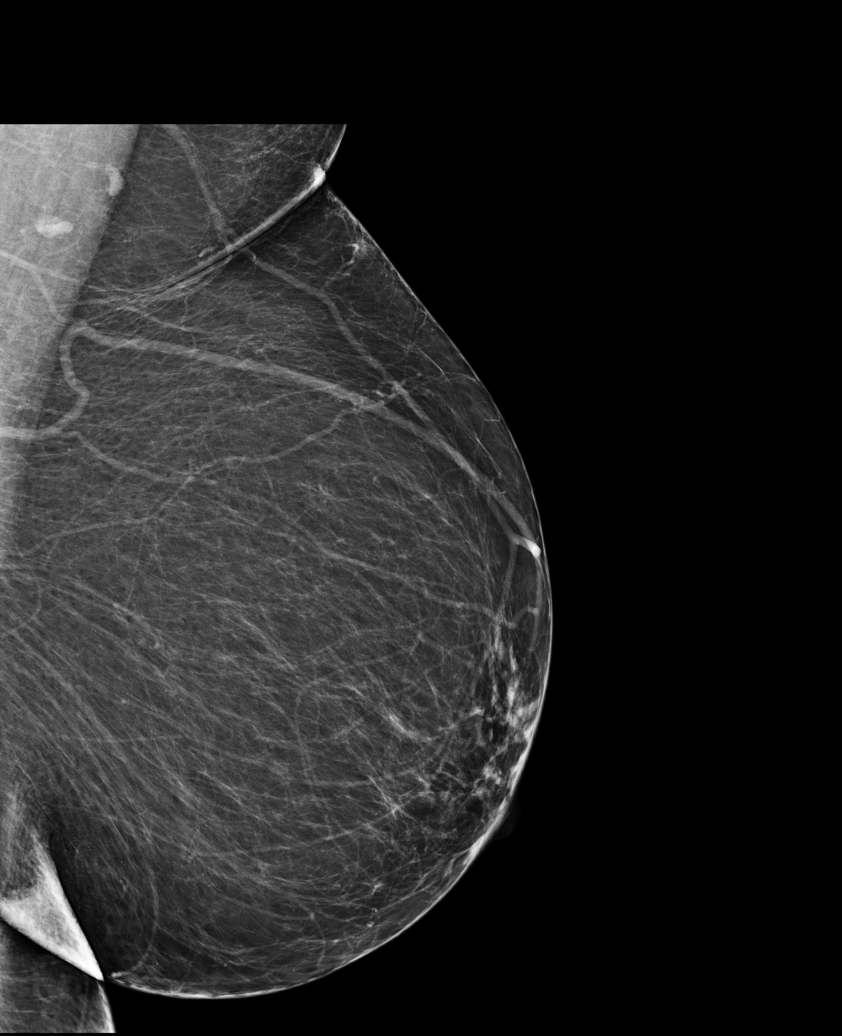

[R CC]
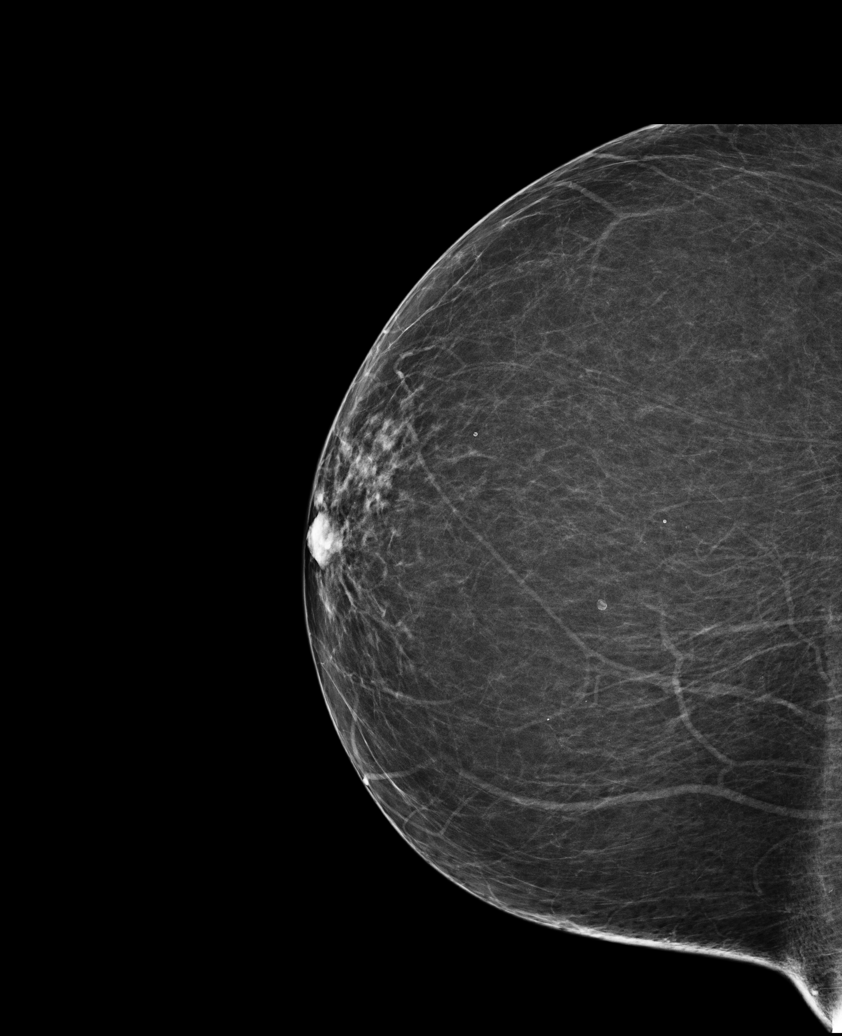

[L MLO (2 of 2)]
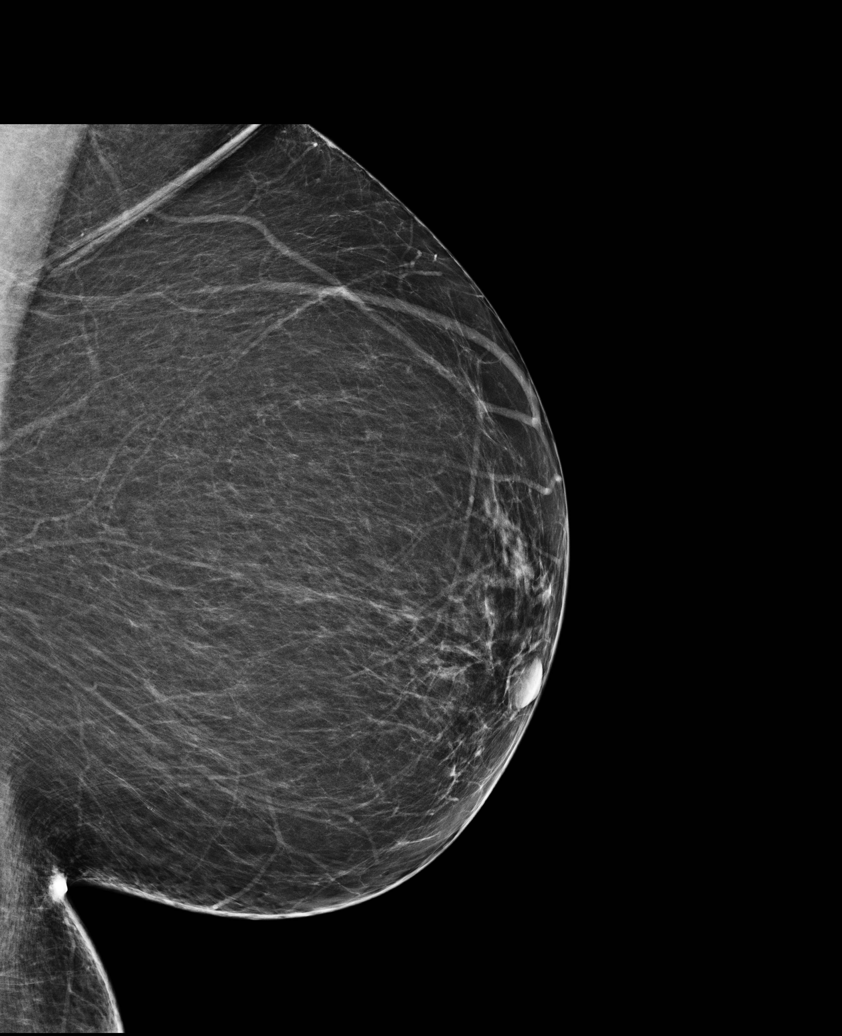

[L CC]
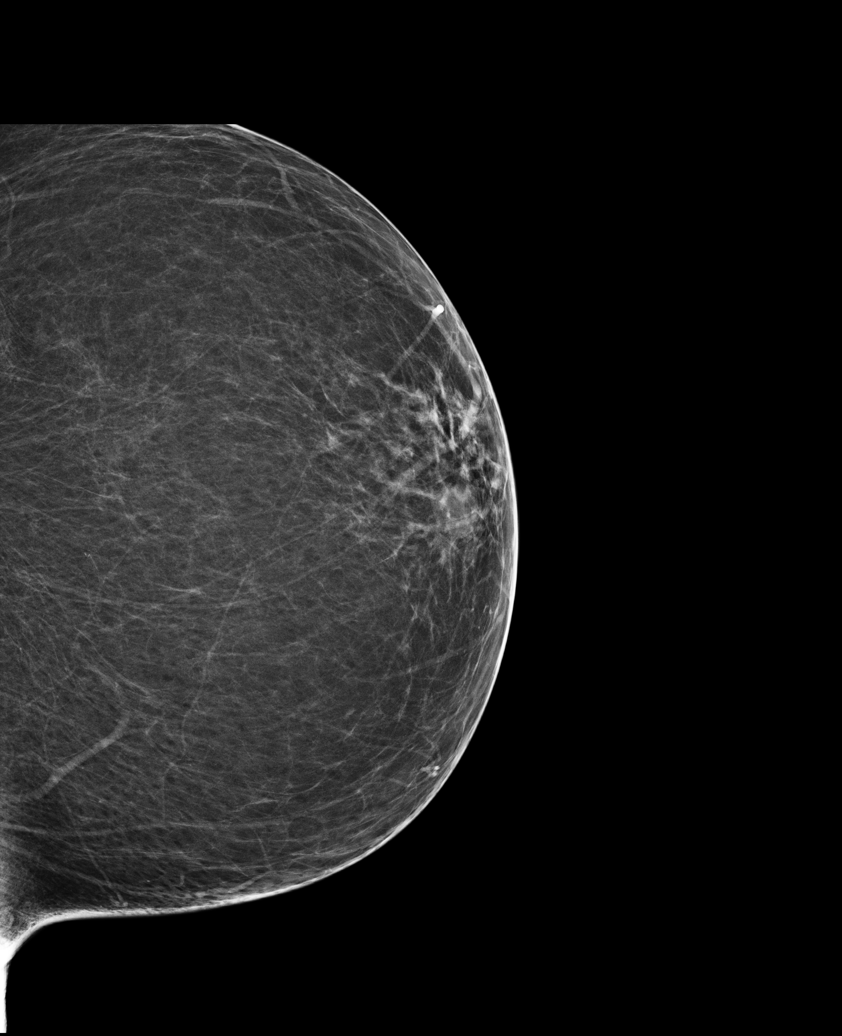

[R MLO (2 of 2)]
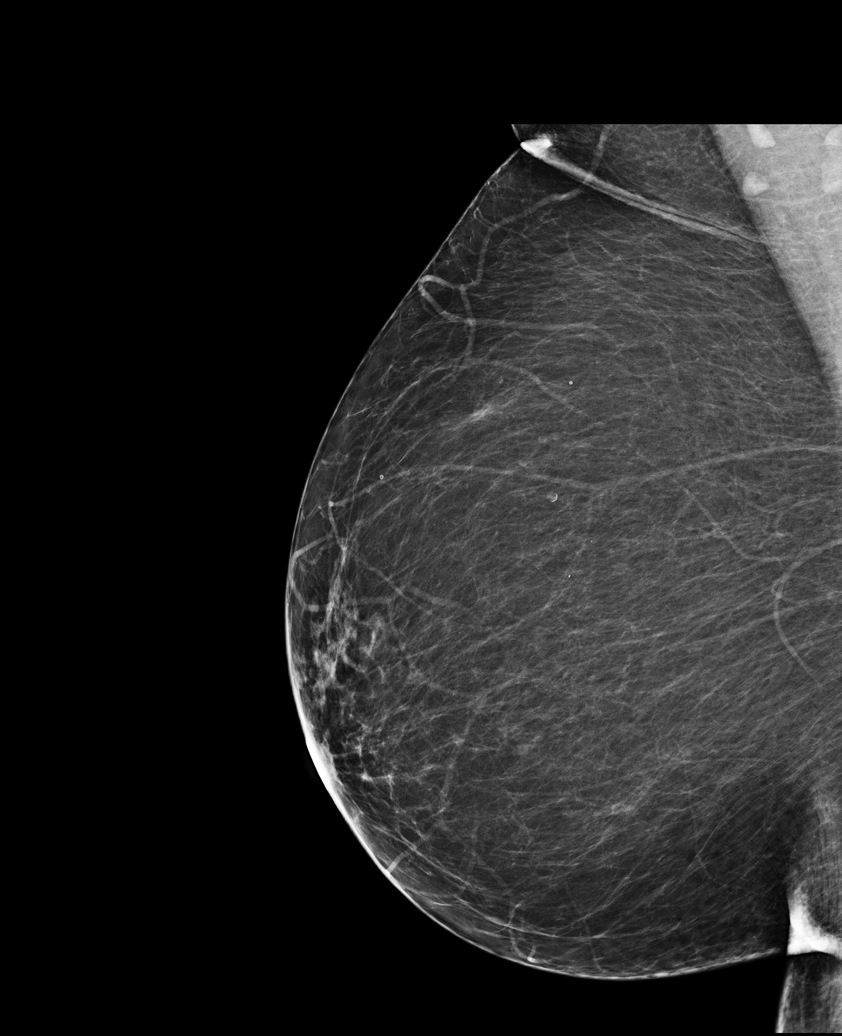

[6 of 26 positions shown; findings below may reference images not displayed]

FINDINGS: There are no findings suspicious for malignancy. Images were
processed with CAD.
IMPRESSION: No mammographic evidence of malignancy. A result letter of this
screening mammogram will be mailed directly to the patient.

RECOMMENDATION:
Screening mammogram in one year. (Code:[Q7])

BI-RADS CATEGORY  1: Negative

## 2013-06-24 ENCOUNTER — Telehealth (INDEPENDENT_AMBULATORY_CARE_PROVIDER_SITE_OTHER): Payer: Self-pay

## 2013-06-24 NOTE — Telephone Encounter (Signed)
Patient states she is having bilaterally abdomen pain, denies constipation ,diarrhea , she has not changed her diet. She is asking for appt the next week or two please advise

## 2013-06-24 NOTE — Telephone Encounter (Signed)
That's fine.  Please schedule apt

## 2013-06-24 NOTE — Telephone Encounter (Signed)
I called pt and scheduled her to come in and get checked tomorrow.  I had a spot that came open.

## 2013-06-25 ENCOUNTER — Encounter (INDEPENDENT_AMBULATORY_CARE_PROVIDER_SITE_OTHER): Payer: Medicare Other | Admitting: General Surgery

## 2013-07-15 ENCOUNTER — Encounter (INDEPENDENT_AMBULATORY_CARE_PROVIDER_SITE_OTHER): Payer: Medicare Other | Admitting: General Surgery

## 2013-07-24 ENCOUNTER — Encounter (HOSPITAL_COMMUNITY): Payer: Self-pay

## 2013-07-30 ENCOUNTER — Ambulatory Visit (INDEPENDENT_AMBULATORY_CARE_PROVIDER_SITE_OTHER): Payer: Medicare Other | Admitting: General Surgery

## 2013-08-01 ENCOUNTER — Telehealth (INDEPENDENT_AMBULATORY_CARE_PROVIDER_SITE_OTHER): Payer: Self-pay
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
## 2013-08-19 ENCOUNTER — Other Ambulatory Visit (INDEPENDENT_AMBULATORY_CARE_PROVIDER_SITE_OTHER): Payer: Self-pay

## 2013-08-19 ENCOUNTER — Ambulatory Visit (INDEPENDENT_AMBULATORY_CARE_PROVIDER_SITE_OTHER): Payer: Medicare Other | Admitting: General Surgery

## 2013-08-19 ENCOUNTER — Encounter (INDEPENDENT_AMBULATORY_CARE_PROVIDER_SITE_OTHER): Payer: Self-pay | Admitting: General Surgery

## 2013-08-19 VITALS — BP 132/76 | HR 76 | Temp 98.0°F | Resp 18 | Ht 63.0 in | Wt 180.0 lb

## 2013-08-19 DIAGNOSIS — C20 Malignant neoplasm of rectum: Secondary | ICD-10-CM

## 2013-08-19 DIAGNOSIS — C189 Malignant neoplasm of colon, unspecified: Secondary | ICD-10-CM

## 2013-08-19 NOTE — Patient Instructions (Signed)
Please go get your CEA level checked.  Dr. Erlinda Hong office to be colon to schedule colonoscopy same. If you do not hear from the office, let me know.

## 2013-08-19 NOTE — Progress Notes (Signed)
Erica [name] is a 58 y.o. female who is here for a follow up visit regarding Who is status post a low anterior resection for a stage I rectal cancer. She reports good bowel movements. She denies any rectal bleeding. She is still having some left-sided abdominal pain which correlates with a scar from her previous incisional wound infection.  She denies any weight loss.  Objective: Filed Vitals:   08/19/13 1720  BP: 132/76  Pulse: 76  Temp: 98 F (36.7 C)  Resp: 18    General appearance: alert and cooperative Resp: clear to auscultation bilaterally Cardio: regular rate and rhythm GI: normal findings: soft, non-tender No hernias palpated at Pfannenstiel incision. Tenderness noted at area of scar tissue on the left portion of the Pfannenstiel incision.  Assessment and Plan: TERRANCE LANAHAN Seems to be doing well after surgery. She has a stage I rectal cancer and her CEA levels have been normal. She will need to undergo a repeat colonoscopy in the next few months. I will repeat her CEA level today. I will see her back in 3 months.    Rosario Adie, MD [facility], [place]

## 2013-08-29 ENCOUNTER — Ambulatory Visit (INDEPENDENT_AMBULATORY_CARE_PROVIDER_SITE_OTHER): Payer: Medicare Other | Admitting: General Surgery

## 2013-09-24 ENCOUNTER — Emergency Department (HOSPITAL_COMMUNITY)
Admission: EM | Admit: 2013-09-24 | Discharge: 2013-09-24 | Disposition: A | Discharge status: Home / Self Care | Payer: No Typology Code available for payment source | Attending: Emergency Medicine | Admitting: Emergency Medicine

## 2013-09-24 ENCOUNTER — Emergency Department (HOSPITAL_COMMUNITY): Payer: No Typology Code available for payment source

## 2013-09-24 ENCOUNTER — Encounter (HOSPITAL_COMMUNITY): Payer: Self-pay | Admitting: Emergency Medicine

## 2013-09-24 DIAGNOSIS — S59909A Unspecified injury of unspecified elbow, initial encounter: Secondary | ICD-10-CM | POA: Diagnosis not present

## 2013-09-24 DIAGNOSIS — I10 Essential (primary) hypertension: Secondary | ICD-10-CM | POA: Diagnosis not present

## 2013-09-24 DIAGNOSIS — S99929A Unspecified injury of unspecified foot, initial encounter: Secondary | ICD-10-CM

## 2013-09-24 DIAGNOSIS — Y92410 Unspecified street and highway as the place of occurrence of the external cause: Secondary | ICD-10-CM | POA: Insufficient documentation

## 2013-09-24 DIAGNOSIS — S0993XA Unspecified injury of face, initial encounter: Secondary | ICD-10-CM | POA: Diagnosis not present

## 2013-09-24 DIAGNOSIS — K219 Gastro-esophageal reflux disease without esophagitis: Secondary | ICD-10-CM | POA: Diagnosis not present

## 2013-09-24 DIAGNOSIS — S79919A Unspecified injury of unspecified hip, initial encounter: Secondary | ICD-10-CM | POA: Diagnosis not present

## 2013-09-24 DIAGNOSIS — Z8719 Personal history of other diseases of the digestive system: Secondary | ICD-10-CM | POA: Insufficient documentation

## 2013-09-24 DIAGNOSIS — Z79899 Other long term (current) drug therapy: Secondary | ICD-10-CM | POA: Diagnosis not present

## 2013-09-24 DIAGNOSIS — Z880 Allergy status to penicillin: Secondary | ICD-10-CM | POA: Insufficient documentation

## 2013-09-24 DIAGNOSIS — S46909A Unspecified injury of unspecified muscle, fascia and tendon at shoulder and upper arm level, unspecified arm, initial encounter: Secondary | ICD-10-CM | POA: Diagnosis present

## 2013-09-24 DIAGNOSIS — S199XXA Unspecified injury of neck, initial encounter: Secondary | ICD-10-CM

## 2013-09-24 DIAGNOSIS — S8990XA Unspecified injury of unspecified lower leg, initial encounter: Secondary | ICD-10-CM | POA: Diagnosis not present

## 2013-09-24 DIAGNOSIS — S6990XA Unspecified injury of unspecified wrist, hand and finger(s), initial encounter: Secondary | ICD-10-CM

## 2013-09-24 DIAGNOSIS — E119 Type 2 diabetes mellitus without complications: Secondary | ICD-10-CM | POA: Insufficient documentation

## 2013-09-24 DIAGNOSIS — S4980XA Other specified injuries of shoulder and upper arm, unspecified arm, initial encounter: Secondary | ICD-10-CM | POA: Insufficient documentation

## 2013-09-24 DIAGNOSIS — S59919A Unspecified injury of unspecified forearm, initial encounter: Secondary | ICD-10-CM

## 2013-09-24 DIAGNOSIS — S79929A Unspecified injury of unspecified thigh, initial encounter: Secondary | ICD-10-CM

## 2013-09-24 DIAGNOSIS — Z8739 Personal history of other diseases of the musculoskeletal system and connective tissue: Secondary | ICD-10-CM | POA: Insufficient documentation

## 2013-09-24 DIAGNOSIS — IMO0002 Reserved for concepts with insufficient information to code with codable children: Secondary | ICD-10-CM

## 2013-09-24 DIAGNOSIS — Y9389 Activity, other specified: Secondary | ICD-10-CM | POA: Diagnosis not present

## 2013-09-24 DIAGNOSIS — Z8669 Personal history of other diseases of the nervous system and sense organs: Secondary | ICD-10-CM | POA: Diagnosis not present

## 2013-09-24 DIAGNOSIS — S99919A Unspecified injury of unspecified ankle, initial encounter: Secondary | ICD-10-CM

## 2013-09-24 DIAGNOSIS — J45909 Unspecified asthma, uncomplicated: Secondary | ICD-10-CM | POA: Insufficient documentation

## 2013-09-24 DIAGNOSIS — M25519 Pain in unspecified shoulder: Secondary | ICD-10-CM

## 2013-09-24 DIAGNOSIS — M25569 Pain in unspecified knee: Secondary | ICD-10-CM

## 2013-09-24 DIAGNOSIS — F17200 Nicotine dependence, unspecified, uncomplicated: Secondary | ICD-10-CM | POA: Insufficient documentation

## 2013-09-24 DIAGNOSIS — F329 Major depressive disorder, single episode, unspecified: Secondary | ICD-10-CM | POA: Insufficient documentation

## 2013-09-24 DIAGNOSIS — F3289 Other specified depressive episodes: Secondary | ICD-10-CM | POA: Diagnosis not present
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
## 2013-09-24 MED ORDER — HYDROCODONE-ACETAMINOPHEN 5-325 MG PO TABS: 1.0000 | ORAL_TABLET | Freq: Four times a day (QID) | ORAL | Status: DC | PRN

## 2013-09-24 NOTE — Discharge Instructions (Signed)
Acromioclavicular Injuries The AC (acromioclavicular) joint is the joint in the shoulder where the collarbone (clavicle) meets the shoulder blade (scapula). The part of the shoulder blade connected to the collarbone is called the acromion. Common problems with and treatments for the [facility] joint are detailed below. ARTHRITIS Arthritis occurs when the joint has been injured and the smooth padding between the joints (cartilage) is lost. This is the wear and tear seen in most joints of the body if they have been overused. This causes the joint to produce pain and swelling which is worse with activity.  AC JOINT SEPARATION AC joint separation means that the ligaments connecting the acromion of the shoulder blade and collarbone have been damaged, and the two bones no longer line up. AC separations can be anywhere from mild to severe, and are "graded" depending upon which ligaments are torn and how badly they are torn.  Grade I Injury: the least damage is done, and the [facility] joint still lines up.  Grade II Injury: damage to the ligaments which reinforce the [facility] joint. In a Grade II injury, these ligaments are stretched but not entirely torn. When stressed, the [facility] joint becomes painful and unstable.  Grade III Injury: AC and secondary ligaments are completely torn, and the collarbone is no longer attached to the shoulder blade. This results in deformity; a prominence of the end of the clavicle. AC JOINT FRACTURE AC joint fracture means that there has been a break in the bones of the [facility] joint, usually the end of the clavicle. TREATMENT TREATMENT OF AC ARTHRITIS  There is currently no way to replace the cartilage damaged by arthritis. The best way to improve the condition is to decrease the activities which aggravate the problem. Application of ice to the joint helps decrease pain and soreness (inflammation). The use of non-steroidal anti-inflammatory medication is helpful.  If less conservative measures do not  work, then cortisone shots (injections) may be used. These are anti-inflammatories; they decrease the soreness in the joint and swelling.  If non-surgical measures fail, surgery may be recommended. The procedure is generally removal of a portion of the end of the clavicle. This is the part of the collarbone closest to your acromion which is stabilized with ligaments to the acromion of the shoulder blade. This surgery may be performed using a tube-like instrument with a light (arthroscope) for looking into a joint. It may also be performed as an open surgery through a small incision by the surgeon. Most patients will have good range of motion within 6 weeks and may return to all activity including sports by 8-12 weeks, barring complications. TREATMENT OF AN AC SEPARATION  The initial treatment is to decrease pain. This is best accomplished by immobilizing the arm in a sling and placing an ice pack to the shoulder for 20 to 30 minutes every 2 hours as needed. As the pain starts to subside, it is important to begin moving the fingers, wrist, elbow and eventually the shoulder in order to prevent a stiff or "frozen" shoulder. Instruction on when and how much to move the shoulder will be provided by your caregiver. The length of time needed to regain full motion and function depends on the amount or grade of the injury. Recovery from a Grade I AC separation usually takes 10 to 14 days, whereas a Grade III may take 6 to 8 weeks.  Grade I and II separations usually do not require surgery. Even Grade III injuries usually allow return to full  activity with few restrictions. Treatment is also based on the activity demands of the injured shoulder. For example, a high level quarterback with an injured throwing arm will receive more aggressive treatment than someone with a desk job who rarely uses his/her arm for strenuous activities. In some cases, a painful lump may persist which could require a later surgery. Surgery  can be very successful, but the benefits must be weighed against the potential risks. TREATMENT OF AN AC JOINT FRACTURE Fracture treatment depends on the type of fracture. Sometimes a splint or sling may be all that is required. Other times surgery may be required for repair. This is more frequently the case when the ligaments supporting the clavicle are completely torn. Your caregiver will help you with these decisions and together you can decide what will be the best treatment. HOME CARE INSTRUCTIONS   Apply ice to the injury for 15-20 minutes each hour while awake for 2 days. Put the ice in a plastic bag and place a towel between the bag of ice and skin.  If a sling has been applied, wear it constantly for as long as directed by your caregiver, even at night. The sling or splint can be removed for bathing or showering or as directed. Be sure to keep the shoulder in the same place as when the sling is on. Do not lift the arm.  If a figure-of-eight splint has been applied it should be tightened gently by another person every day. Tighten it enough to keep the shoulders held back. Allow enough room to place the index finger between the body and strap. Loosen the splint immediately if there is numbness or tingling in the hands.  Take over-the-counter or prescription medicines for pain, discomfort or fever as directed by your caregiver.  If you or your child has received a follow up appointment, it is very important to keep that appointment in order to avoid long term complications, chronic pain or disability. SEEK MEDICAL CARE IF:   The pain is not relieved with medications.  There is increased swelling or discoloration that continues to get worse rather than better.  You or your child has been unable to follow up as instructed.  There is progressive numbness and tingling in the arm, forearm or hand. SEEK IMMEDIATE MEDICAL CARE IF:   The arm is numb, cold or pale.  There is increasing pain  in the hand, forearm or fingers. MAKE SURE YOU:   Understand these instructions.  Will watch your condition.  Will get help right away if you are not doing well or get worse. Document Released: 03/09/2005 Document Revised: 08/22/2011 Document Reviewed: 09/01/2008 [facility] Patient Information 2014 [place]. Knee Effusion The medical term for having fluid in your knee is effusion. This is often due to an internal derangement of the knee. This means something is wrong inside the knee. Some of the causes of fluid in the knee may be torn cartilage, a torn ligament, or bleeding into the joint from an injury. Your knee is likely more difficult to bend and move. This is often because there is increased pain and pressure in the joint. The time it takes for recovery from a knee effusion depends on different factors, including:   Type of injury.  Your age.  Physical and medical conditions.  Rehabilitation Strategies. How long you will be away from your normal activities will depend on what kind of knee problem you have and how much damage is present. Your knee has two  types of cartilage. Articular cartilage covers the bone ends and lets your knee bend and move smoothly. Two menisci, thick pads of cartilage that form a rim inside the joint, help absorb shock and stabilize your knee. Ligaments bind the bones together and support your knee joint. Muscles move the joint, help support your knee, and take stress off the joint itself. CAUSES  Often an effusion in the knee is caused by an injury to one of the menisci. This is often a tear in the cartilage. Recovery after a meniscus injury depends on how much meniscus is damaged and whether you have damaged other knee tissue. Small tears may heal on their own with conservative treatment. Conservative means rest, limited weight bearing activity and muscle strengthening exercises. Your recovery may take up to 6 weeks.  TREATMENT  Larger tears may require  surgery. Meniscus injuries may be treated during arthroscopy. Arthroscopy is a procedure in which your surgeon uses a small telescope like instrument to look in your knee. Your caregiver can make a more accurate diagnosis (learning what is wrong) by performing an arthroscopic procedure. If your injury is on the inner margin of the meniscus, your surgeon may trim the meniscus back to a smooth rim. In other cases your surgeon will try to repair a damaged meniscus with stitches (sutures). This may make rehabilitation take longer, but may provide better long term result by helping your knee keep its shock absorption capabilities. Ligaments which are completely torn usually require surgery for repair. HOME CARE INSTRUCTIONS  Use crutches as instructed.  If a brace is applied, use as directed.  Once you are home, an ice pack applied to your swollen knee may help with discomfort and help decrease swelling.  Keep your knee raised (elevated) when you are not up and around or on crutches.  Only take over-the-counter or prescription medicines for pain, discomfort, or fever as directed by your caregiver.  Your caregivers will help with instructions for rehabilitation of your knee. This often includes strengthening exercises.  You may resume a normal diet and activities as directed. SEEK MEDICAL CARE IF:   There is increased swelling in your knee.  You notice redness, swelling, or increasing pain in your knee.  An unexplained oral temperature above 102 F (38.9 C) develops. SEEK IMMEDIATE MEDICAL CARE IF:   You develop a rash.  You have difficulty breathing.  You have any allergic reactions from medications you may have been given.  There is severe pain with any motion of the knee. MAKE SURE YOU:   Understand these instructions.  Will watch your condition.  Will get help right away if you are not doing well or get worse. Document Released: 08/20/2003 Document Revised: 08/22/2011 Document  Reviewed: 10/24/2007 [place] [facility] Patient Information 2014 [place].
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
## 2013-09-24 NOTE — ED Notes (Signed)
Transported to radiology.
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
## 2013-09-27 NOTE — ED Provider Notes (Signed)
Medical screening examination/treatment/procedure(s) were performed by non-physician practitioner and as supervising physician I was immediately available for consultation/collaboration.   EKG Interpretation None       Erica [name]. Alvino Chapel, MD 09/27/13 1100

## 2013-10-10 LAB — CEA: CEA: 5 ng/mL (ref 0.0–5.0)

## 2013-10-15 ENCOUNTER — Other Ambulatory Visit (INDEPENDENT_AMBULATORY_CARE_PROVIDER_SITE_OTHER): Payer: Self-pay

## 2013-10-15 ENCOUNTER — Telehealth (INDEPENDENT_AMBULATORY_CARE_PROVIDER_SITE_OTHER): Payer: Self-pay

## 2013-10-15 DIAGNOSIS — C20 Malignant neoplasm of rectum: Secondary | ICD-10-CM

## 2013-10-15 NOTE — Telephone Encounter (Signed)
Informed patient her CEA was slightly elevated but with in normal limits per Dr. Marcello Moores Appointment with DR. Marcello Moores 11/20/13 @ 4:45 Mailed Lab CEA to be drawn before next OV

## 2013-11-18 ENCOUNTER — Other Ambulatory Visit (INDEPENDENT_AMBULATORY_CARE_PROVIDER_SITE_OTHER): Payer: Self-pay | Admitting: General Surgery

## 2013-11-19 LAB — CEA: CEA: 5 ng/mL (ref 0.0–5.0)

## 2013-11-20 ENCOUNTER — Ambulatory Visit (INDEPENDENT_AMBULATORY_CARE_PROVIDER_SITE_OTHER): Payer: Medicare Other | Admitting: General Surgery

## 2013-11-20 ENCOUNTER — Encounter (INDEPENDENT_AMBULATORY_CARE_PROVIDER_SITE_OTHER): Payer: Self-pay | Admitting: General Surgery

## 2013-11-20 VITALS — BP 142/90 | HR 78 | Temp 98.2°F | Resp 18 | Ht 63.0 in | Wt 178.0 lb

## 2013-11-20 DIAGNOSIS — C20 Malignant neoplasm of rectum: Secondary | ICD-10-CM

## 2013-11-20 NOTE — Patient Instructions (Signed)
Return to the office in 3 months.  We will repeat your CT scans at that point.

## 2013-11-20 NOTE — Progress Notes (Signed)
Erica [name] is a 58 y.o. female who is here for a follow up visit regarding her rectal cancer.  She is status post a low anterior resection for a stage I rectal cancer in Aug 2014. She reports good bowel movements. She has occasional rectal bleeding when wiping after several loose stools.  She denies constipation. She is still having some left-sided abdominal pain which correlates with a scar from her previous incisional wound infection. She denies any weight loss. She is having some urgency after meals.  She is scheduled for a colonoscopy in Aug.   Objective:  Filed Vitals:   11/20/13 1628  BP: 142/90  Pulse: 78  Temp: 98.2 F (36.8 C)  Resp: 18    General appearance: alert and cooperative  Resp: clear to auscultation bilaterally  Cardio: regular rate and rhythm  GI: normal findings: soft, non-tender  No hernias palpated at Pfannenstiel incision. Tenderness noted at area of scar tissue on the left portion of the Pfannenstiel incision.   Lab Results  Component Value Date   CEA 5.0 11/18/2013  3/15: 5.0 12/14: 3.9 5/14: 3.7 Path:  1. 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  - NO EVIDENCE OF ANGIOLYMPHATIC INVASION IDENTIFIED.  - EIGHT LYMPH NODES, NEGATIVE FOR METASTATIC CARCINOMA (0/8 ), PLEASE SEE COMMENT  - MULTIPLE SERRATED POLYPS/ADENOMA.  - RESECTION MARGINS, NEGATIVE FOR ATYPIA OR MALIGNANCY.  2. Colon, resection margin (donut), anastomotic rings  - BENIGN COLONIC MUCOSA, NO EVIDENCE OF MALIGNANCY. T1N0  Assessment and Plan:  ANYI FELS Seems to be doing well after surgery. She had a stage I rectal cancer and her CEA levels have been normal. Her prognosis is excellent.  She will need to undergo a repeat colonoscopy in the next few months. I will repeat her CEA level in 3 months. I will see her back in 3 months.

## 2014-02-04 ENCOUNTER — Other Ambulatory Visit: Payer: Self-pay | Admitting: Gastroenterology

## 2014-03-06 ENCOUNTER — Other Ambulatory Visit (INDEPENDENT_AMBULATORY_CARE_PROVIDER_SITE_OTHER): Payer: Self-pay | Admitting: General Surgery

## 2014-03-06 DIAGNOSIS — C20 Malignant neoplasm of rectum: Secondary | ICD-10-CM

## 2014-03-06 DIAGNOSIS — Z85048 Personal history of other malignant neoplasm of rectum, rectosigmoid junction, and anus: Secondary | ICD-10-CM

## 2014-03-11 ENCOUNTER — Ambulatory Visit
Admission: RE | Admit: 2014-03-11 | Discharge: 2014-03-11 | Disposition: A | Discharge status: Home / Self Care | Payer: Medicare Other | Source: Ambulatory Visit | Attending: General Surgery | Admitting: General Surgery

## 2014-03-11 DIAGNOSIS — C20 Malignant neoplasm of rectum: Secondary | ICD-10-CM

## 2014-03-11 IMAGING — CT CT ABD-PELV W/ CM
2 of 7 series · 16 of 46 positions shown, 18 images · IV contrast (READICAT/WATER & [ID] OMNI 300)
Comparison: None.

CLINICAL DATA: Rectal cancer.  Diarrhea.

EXAM:
CT CHEST, ABDOMEN, AND PELVIS WITH CONTRAST
TECHNIQUE: Multidetector CT imaging of the chest, abdomen and pelvis was
performed following the standard protocol during bolus
administration of intravenous contrast.
CONTRAST:  100mL OMNIPAQUE IOHEXOL 300 MG/ML  SOLN

[Series 4: thin recons · axial · 0.82mm/px · z∈[-630,-56]mm · 13 of 656 slices shown, 15 images]
[im 41/656  soft-tissue]
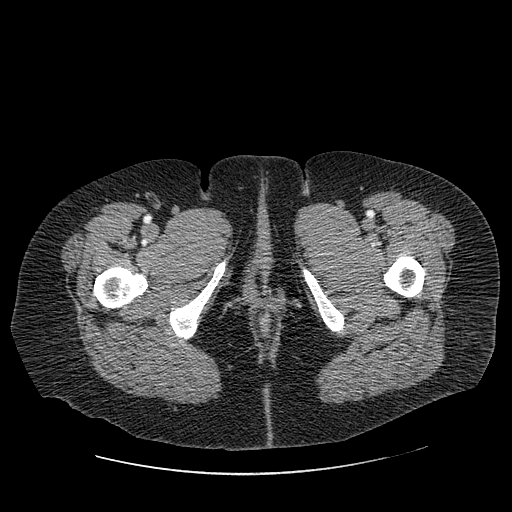
[im 41/656  bone]
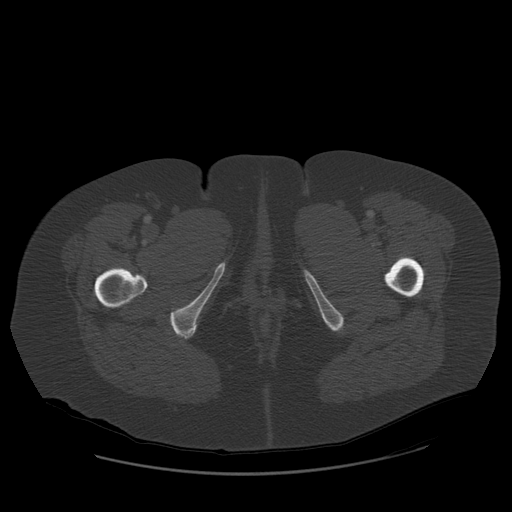
[im 82/656  soft-tissue]
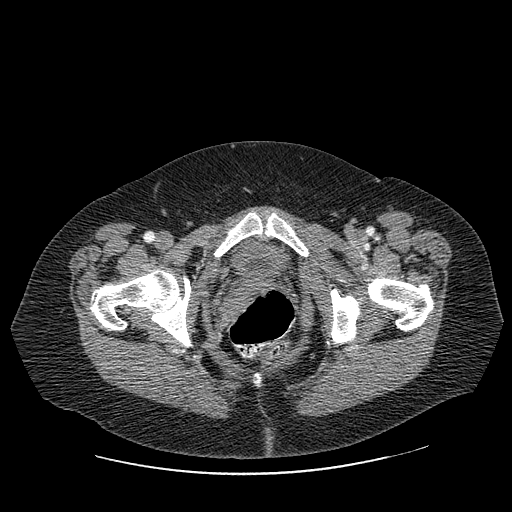
[im 123/656  soft-tissue]
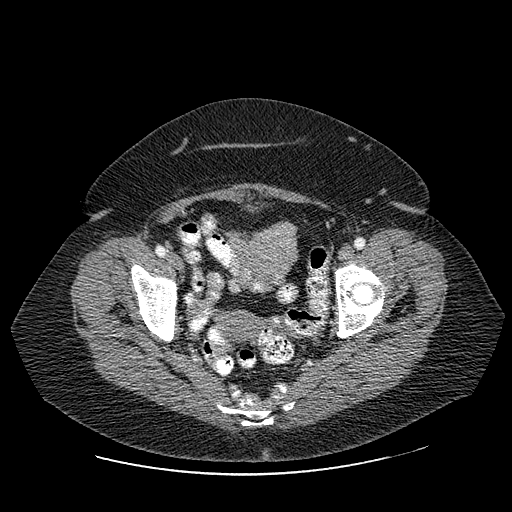
[im 205/656  soft-tissue]
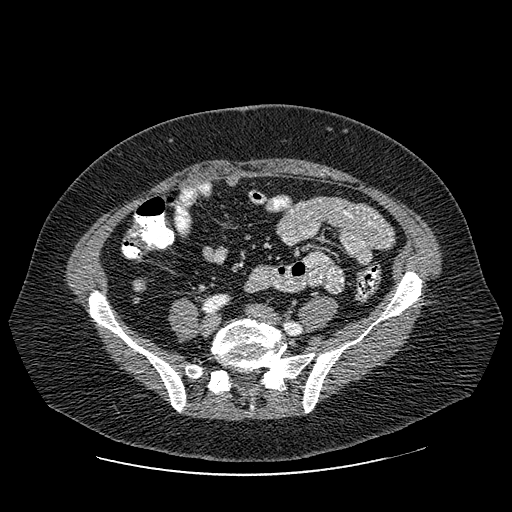
[im 246/656  soft-tissue]
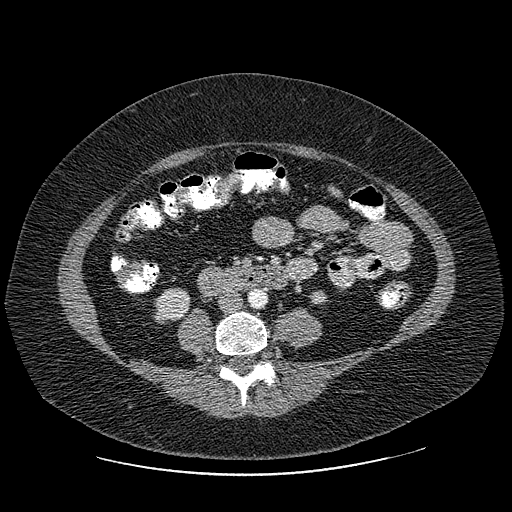
[im 287/656  soft-tissue]
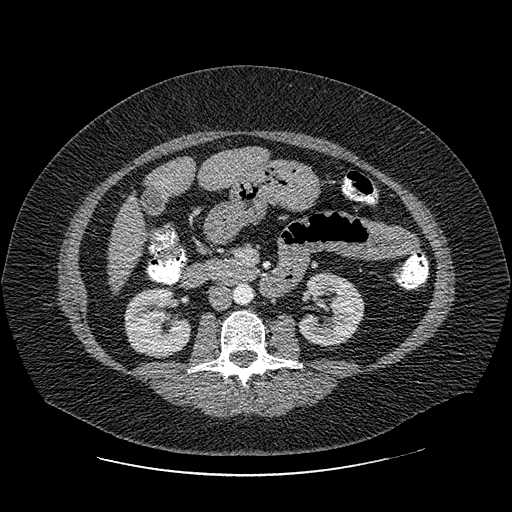
[im 328/656  soft-tissue]
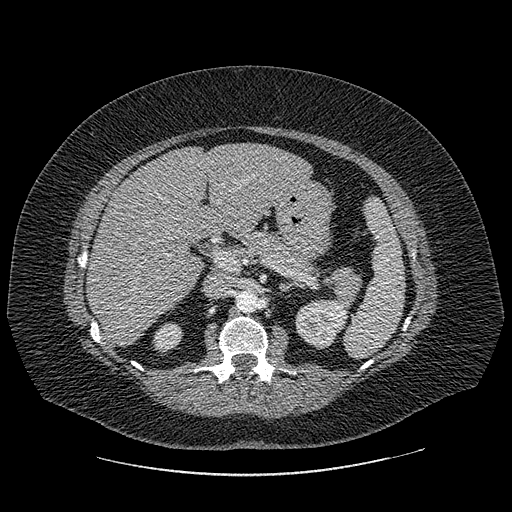
[im 369/656  soft-tissue]
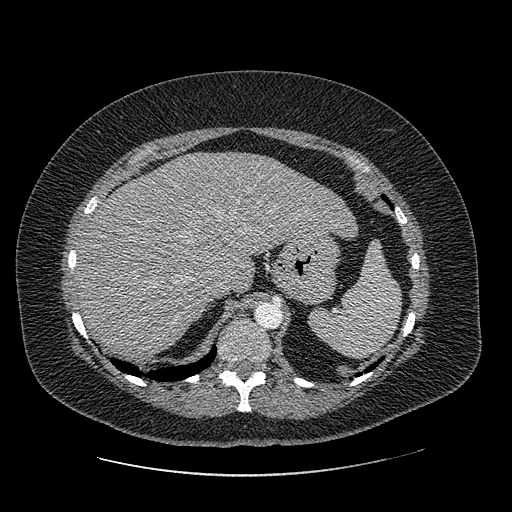
[im 410/656  soft-tissue]
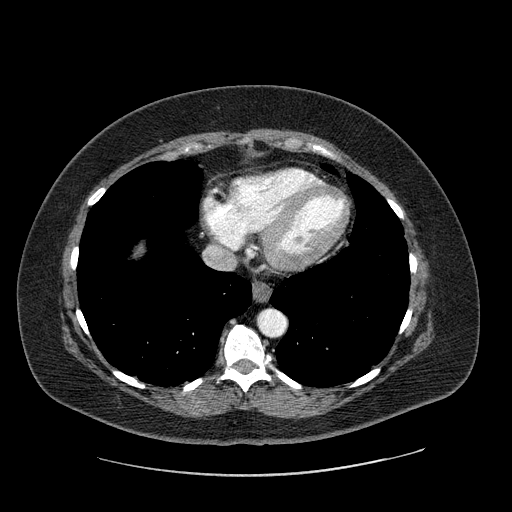
[im 410/656  bone]
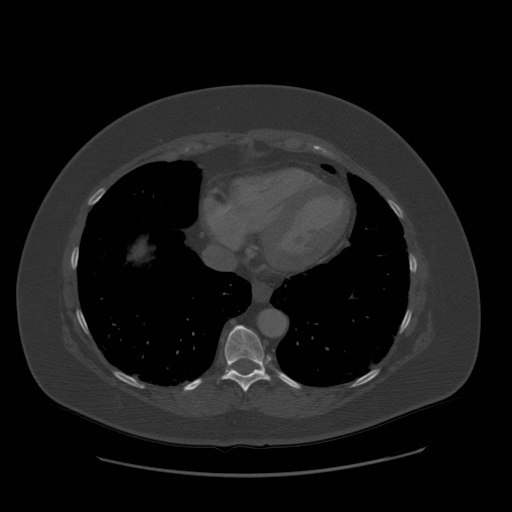
[im 451/656  soft-tissue]
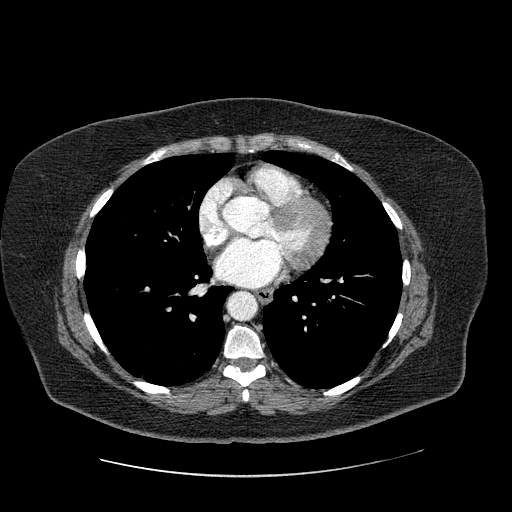
[im 533/656  soft-tissue]
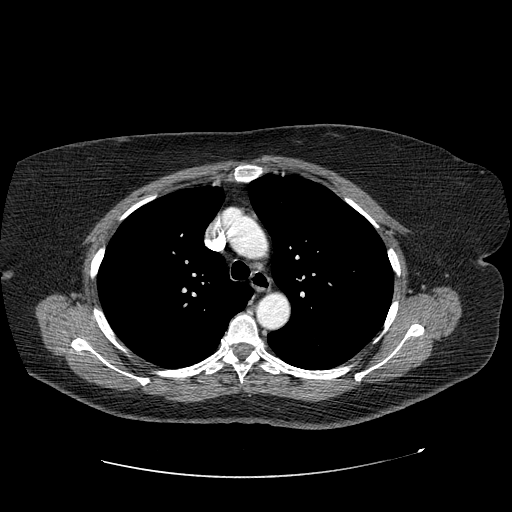
[im 574/656  soft-tissue]
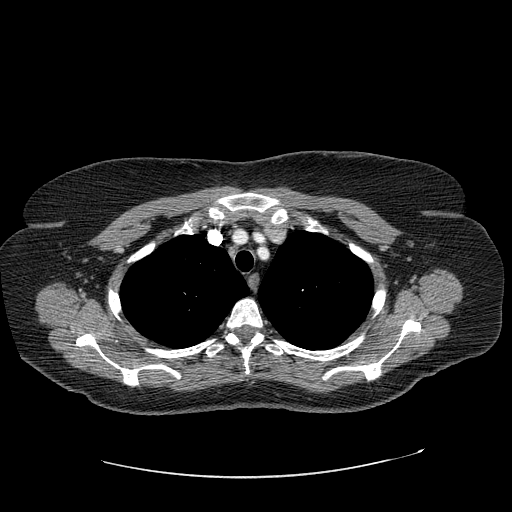
[im 615/656  soft-tissue]
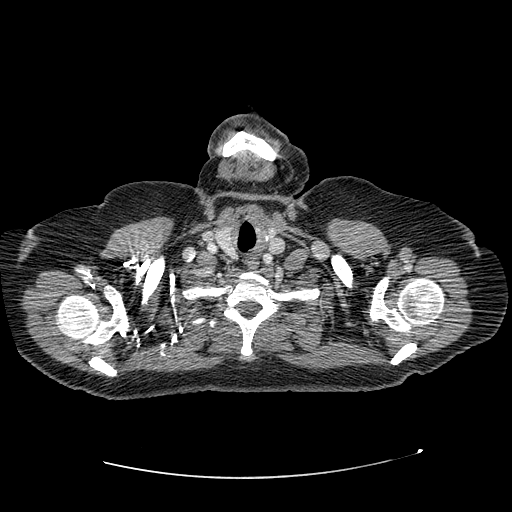

[Series 400: cor · coronal · 1.28mm/px · 3 of 168 slices shown]
[im 56/168  soft-tissue]
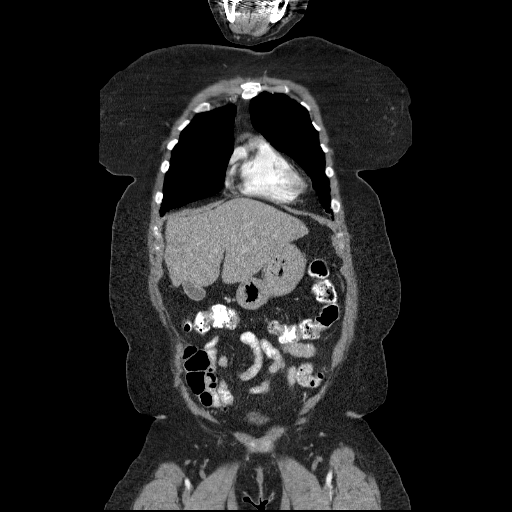
[im 75/168  soft-tissue]
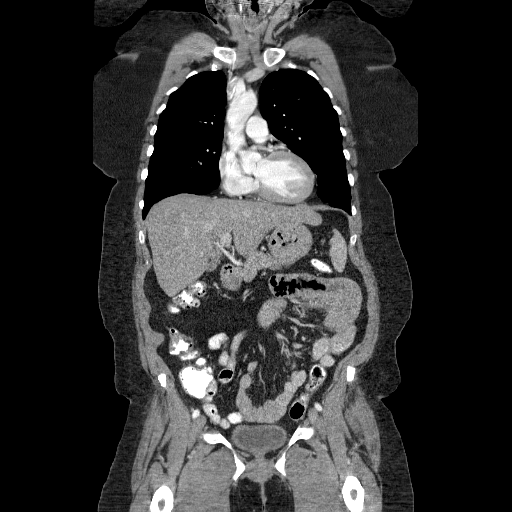
[im 93/168  soft-tissue]
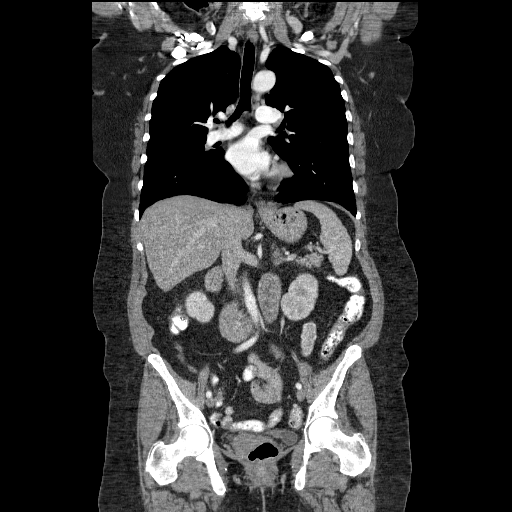

[16 of 46 positions shown; findings below may reference images not displayed]

FINDINGS: CT CHEST FINDINGS

Chest wall:

No breast masses, supraclavicular or axillary lymphadenopathy. The
thyroid gland is grossly normal. The bony thorax is intact. No
destructive bone lesions or spinal canal compromise.

Mediastinum:

The heart is normal in size. No pericardial effusion. No mediastinal
or hilar mass or lymphadenopathy. Small scattered lymph nodes are
noted. The aorta is normal in caliber. No dissection. Moderate
tortuosity. The branch vessels are patent. Coronary artery
calcifications are noted. The esophagus is grossly normal.

Lungs:

Biapical scarring changes. Mild emphysematous changes. Peripheral
interstitial thickening, faint nodularity and early architectural
distortion could reflect early changes of interstitial lung disease.
Nodularity along the left major fissure is most likely small
intrapulmonary lymph nodes. No focal airspace consolidation, pleural
effusion or bronchiectasis. The tracheobronchial tree is grossly
normal.

CT ABDOMEN AND PELVIS FINDINGS

Hepatobiliary: No focal hepatic lesions or intrahepatic biliary
dilatation. The portal and hepatic veins are patent. The gallbladder
is within normal limits. No common bile duct dilatation.

Pancreas: Normal.

Spleen: Normal.

Adrenals/Urinary Tract: The adrenal glands and kidneys are normal.

Stomach/Bowel: The stomach, duodenum, small bowel and colon are
unremarkable. No inflammatory changes, mass lesions or obstructive
findings. Mild sigmoid diverticulosis but no findings for acute
diverticulitis. The terminal ileum is normal. The appendix is
normal. No findings to suggest recurrent rectal tumor.

Vascular/Lymphatic: The aorta is normal in caliber. The branch
vessels are patent. The major venous structures are patent. No
mesenteric or retroperitoneal mass or lymphadenopathy.

Other: The uterus and ovaries are normal. The bladder is normal. No
pelvic mass, adenopathy or free pelvic fluid collections. No
inguinal mass or adenopathy.

Musculoskeletal: No significant bony findings. There is a rim
calcified disc protrusion and degenerative disc disease noted at
L5-S1.
IMPRESSION: 1. No acute abdominal/pelvic findings, mass lesions or adenopathy.
No findings to suggest recurrent rectal cancer and no adenopathy or
metastatic disease.
2. Emphysematous changes.
3. Suspect early interstitial lung disease.
4. Tortuous and mildly ectatic thoracoabdominal aorta but no
significant atherosclerotic calcifications, aneurysm or dissection.

## 2014-03-11 MED ORDER — IOHEXOL 300 MG/ML  SOLN
100.0000 mL | Freq: Once | INTRAMUSCULAR | Status: AC | PRN
  Administered 2014-03-11: 100 mL via INTRAVENOUS

## 2014-04-14 ENCOUNTER — Encounter (INDEPENDENT_AMBULATORY_CARE_PROVIDER_SITE_OTHER): Payer: Self-pay | Admitting: General Surgery

## 2014-04-18 ENCOUNTER — Emergency Department (INDEPENDENT_AMBULATORY_CARE_PROVIDER_SITE_OTHER)
Admission: EM | Admit: 2014-04-18 | Discharge: 2014-04-18 | Disposition: A | Discharge status: Home / Self Care | Payer: Medicare Other | Source: Home / Self Care | Attending: Family Medicine | Admitting: Family Medicine

## 2014-04-18 ENCOUNTER — Ambulatory Visit (HOSPITAL_COMMUNITY): Payer: Medicare Other | Attending: Emergency Medicine

## 2014-04-18 ENCOUNTER — Encounter (HOSPITAL_COMMUNITY): Payer: Self-pay | Admitting: Emergency Medicine

## 2014-04-18 DIAGNOSIS — R05 Cough: Secondary | ICD-10-CM | POA: Diagnosis present

## 2014-04-18 DIAGNOSIS — J449 Chronic obstructive pulmonary disease, unspecified: Secondary | ICD-10-CM | POA: Insufficient documentation

## 2014-04-18 DIAGNOSIS — J441 Chronic obstructive pulmonary disease with (acute) exacerbation: Secondary | ICD-10-CM

## 2014-04-18 DIAGNOSIS — R509 Fever, unspecified: Secondary | ICD-10-CM | POA: Insufficient documentation

## 2014-04-18 LAB — POCT I-STAT, CHEM 8
BUN: 14 mg/dL (ref 6–23)
Calcium, Ion: 1.16 mmol/L (ref 1.12–1.23)
Chloride: 104 mEq/L (ref 96–112)
Creatinine, Ser: 1.1 mg/dL (ref 0.50–1.10)
Glucose, Bld: 179 mg/dL — ABNORMAL HIGH (ref 70–99)
HCT: 35 % — ABNORMAL LOW (ref 36.0–46.0)
Hemoglobin: 11.9 g/dL — ABNORMAL LOW (ref 12.0–15.0)
Potassium: 4.5 mEq/L (ref 3.7–5.3)
Sodium: 141 mEq/L (ref 137–147)
TCO2: 28 mmol/L (ref 0–100)

## 2014-04-18 IMAGING — CR DG CHEST 2V
2 series · 2 of 2 positions shown · non-contrast
Comparison: [DATE]

CLINICAL DATA: Fever and cough

EXAM:
CHEST  2 VIEW

[w chest pa]
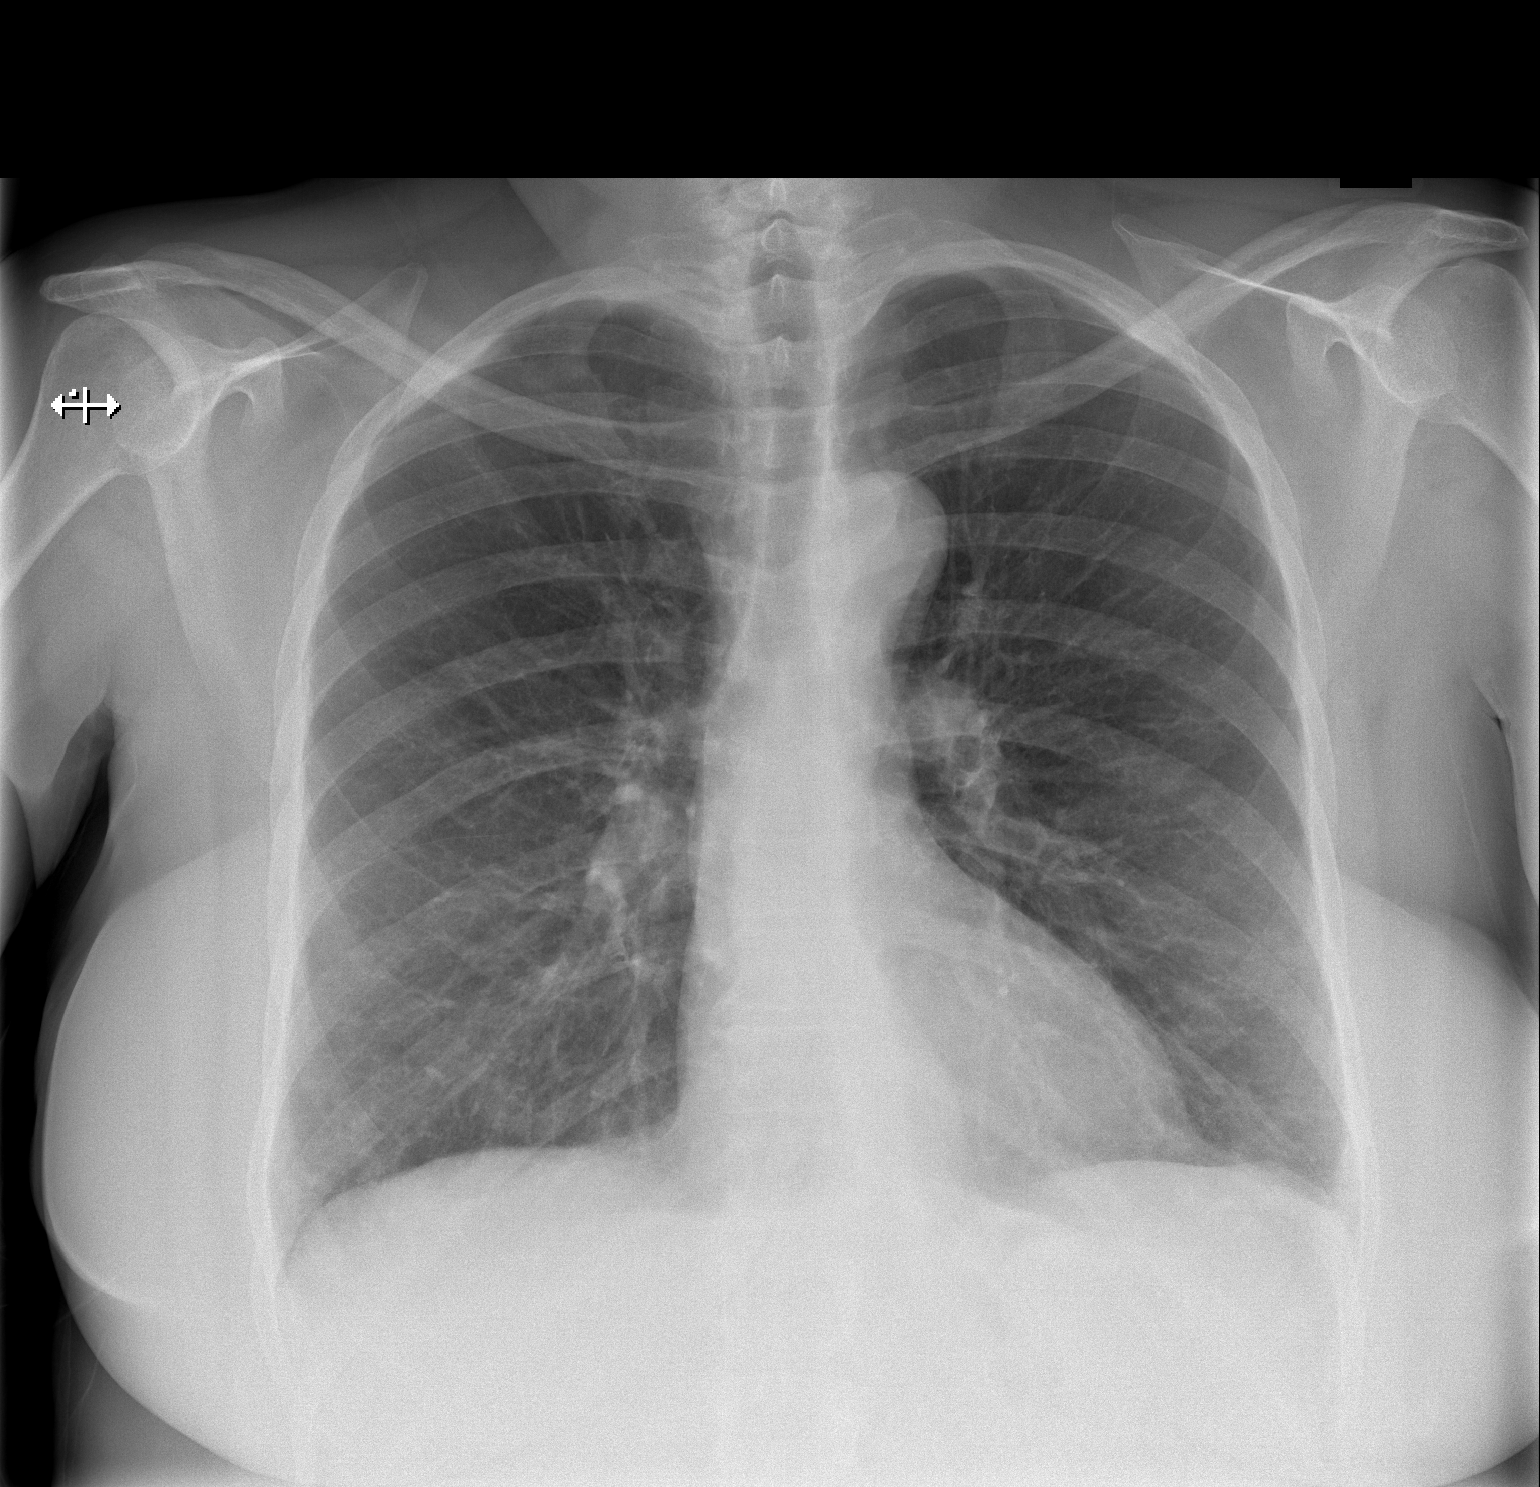

[w chest lat]
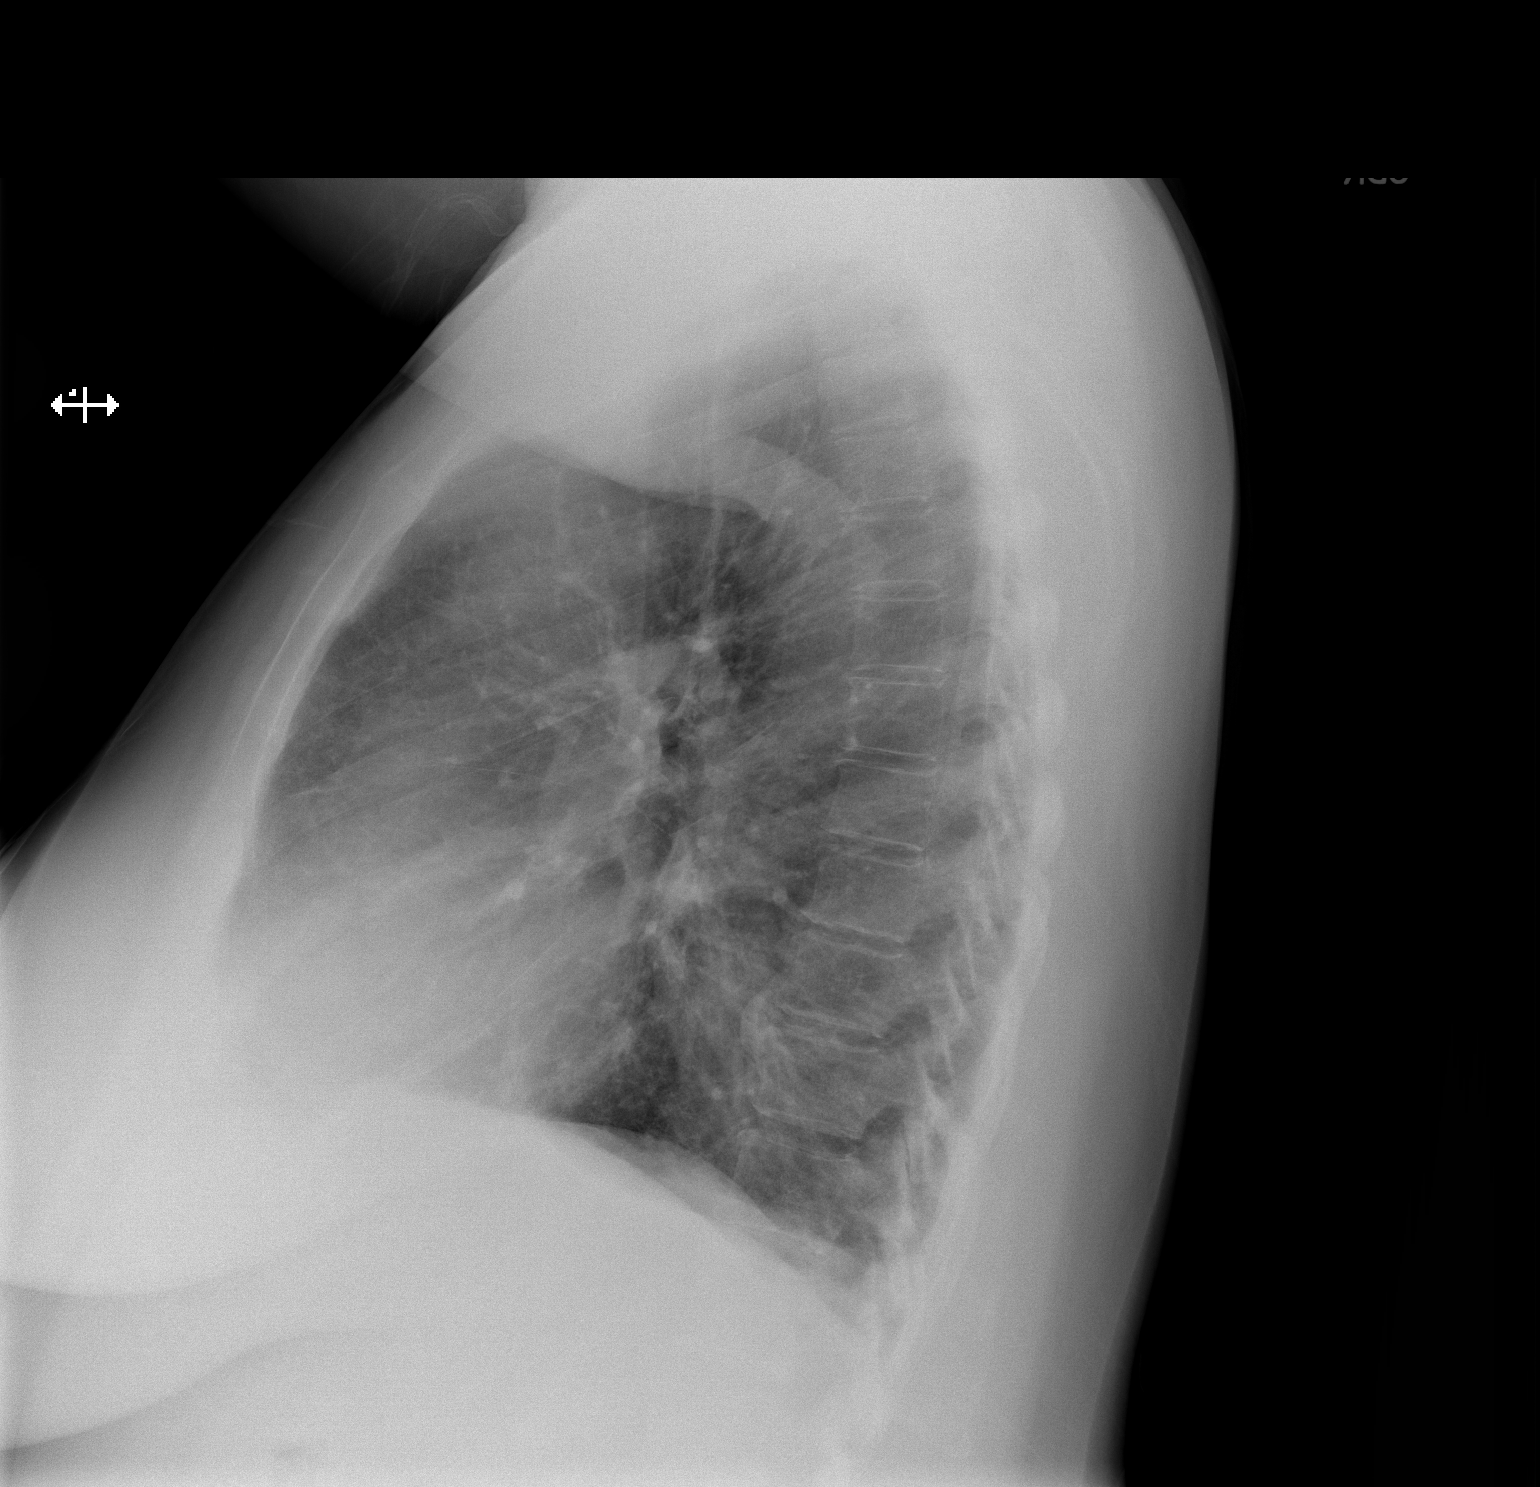

[2 of 2 positions shown; findings below may reference images not displayed]

FINDINGS: Cardiac shadow is within normal limits. The lungs are well aerated
bilaterally with and mildly hyperinflated. No focal infiltrate or
sizable effusion is seen. No bony abnormality is noted.
IMPRESSION: COPD without acute abnormality.

## 2014-04-18 MED ORDER — GUAIFENESIN-CODEINE 100-10 MG/5ML PO SOLN: 5.0000 mL | ORAL | Status: DC | PRN

## 2014-04-18 MED ORDER — LEVOFLOXACIN 500 MG PO TABS: 500.0000 mg | ORAL_TABLET | Freq: Every day | ORAL | Status: DC

## 2014-04-18 NOTE — Discharge Instructions (Signed)
Chronic Obstructive Pulmonary Disease Exacerbation Chronic obstructive pulmonary disease (COPD) is a common lung condition in which airflow from the lungs is limited. COPD is a general term that can be used to describe many different lung problems that limit airflow, including chronic bronchitis and emphysema. COPD exacerbations are episodes when breathing symptoms become much worse and require extra treatment. Without treatment, COPD exacerbations can be life threatening, and frequent COPD exacerbations can cause further damage to your lungs. CAUSES   Respiratory infections.   Exposure to smoke.   Exposure to air pollution, chemical fumes, or dust. Sometimes there is no apparent cause or trigger. RISK FACTORS  Smoking cigarettes.  Older age.  Frequent prior COPD exacerbations. SIGNS AND SYMPTOMS   Increased coughing.   Increased thick spit (sputum) production.   Increased wheezing.   Increased shortness of breath.   Rapid breathing.   Chest tightness. DIAGNOSIS  Your medical history, a physical exam, and tests will help your health care provider make a diagnosis. Tests may include:  A chest X-ray.  Basic lab tests.  Sputum testing.  An arterial blood gas test. TREATMENT  Depending on the severity of your COPD exacerbation, you may need to be admitted to a hospital for treatment. Some of the treatments commonly used to treat COPD exacerbations are:   Antibiotic medicines.   Bronchodilators. These are drugs that expand the air passages. They may be given with an inhaler or nebulizer. Spacer devices may be needed to help improve drug delivery.  Corticosteroid medicines.  Supplemental oxygen therapy.  HOME CARE INSTRUCTIONS   Do not smoke. Quitting smoking is very important to prevent COPD from getting worse and exacerbations from happening as often.  Avoid exposure to all substances that irritate the airway, especially to tobacco smoke.   If you were  prescribed an antibiotic medicine, finish it all even if you start to feel better.  Take all medicines as directed by your health care provider.It is important to use correct technique with inhaled medicines.  Drink enough fluids to keep your urine clear or pale yellow (unless you have a medical condition that requires fluid restriction).  Use a cool mist vaporizer. This makes it easier to clear your chest when you cough.   If you have a home nebulizer and oxygen, continue to use them as directed.   Maintain all necessary vaccinations to prevent infections.   Exercise regularly.   Eat a healthy diet.   Keep all follow-up appointments as directed by your health care provider. SEEK IMMEDIATE MEDICAL CARE IF:  You have worsening shortness of breath.   You have trouble talking.   You have severe chest pain.  You have blood in your sputum.  You have a fever.  You have weakness, vomit repeatedly, or faint.   You feel confused.   You continue to get worse. MAKE SURE YOU:   Understand these instructions.  Will watch your condition.  Will get help right away if you are not doing well or get worse. Document Released: 03/27/2007 Document Revised: 10/14/2013 Document Reviewed: 02/01/2013 [facility] Patient Information 2015 [place], [place]. This information is not intended to replace advice given to you by your health care provider. Make sure you discuss any questions you have with your health care provider.

## 2014-04-18 NOTE — ED Provider Notes (Signed)
CSN: [id number]     Arrival date & time 04/18/14  1424 History   None    Chief Complaint  Patient presents with  . Cough  . Fever  . Sore Throat   (Consider location/radiation/quality/duration/timing/severity/associated sxs/prior Treatment) Patient is a 58 y.o. female presenting with cough, fever, and pharyngitis.  Cough Associated symptoms: chills, fever, shortness of breath and sore throat   Associated symptoms: no chest pain   Fever Associated symptoms: chills, congestion, cough, diarrhea (chronic ), nausea, sore throat and vomiting   Associated symptoms: no chest pain   Sore Throat Associated symptoms include shortness of breath. Pertinent negatives include no chest pain and no abdominal pain.         58 year old female with a history of hypertension, diabetes, rectal cancer status post excision, presents complaining of being sick for 2 weeks. For 2 weeks she has had a productive cough that has gotten worse. She has shortness of breath with coughing. She has a sore throat and has had a few episodes of vomiting, as recently as last night. She has had subjective fever and chills as well. She has been taking over-the-counter cough and cold preparations with only mild relief of her symptoms. Her symptoms have progressively worsened for the entire 2 weeks. She has no recent travel or sick contacts. No leg swelling. No pleuritic chest pain. She has no previous history of pneumonia.she is a current every day smoker, 0.25 packs of cigarettes daily.    Past Medical History  Diagnosis Date  . Diabetes mellitus   . Hypertension   . Mental disorder     depression  . Depression   . Asthma   . GERD (gastroesophageal reflux disease)   . Headache(784.0)     "mild"  . Neuropathy     feet   . Arthritis     especially in shoulders  . Rectal polyp     very little bleeding with bowel movements- no pain  . Pain     arthritis pain - takes tramadol as needed   Past Surgical History  Procedure  Laterality Date  . Tonsillectomy and adenoidectomy    . Cesarean section    . Tonsillectomy    . Laparoscopic sigmoid colectomy N/A 11/14/2012    Procedure: DIAGNOSTIC LAPAROSCOPY AND SIGMOIDMOIDOSCOPY ;  Surgeon: Rolm Bookbinder, MD;  Location: [facility];  Service: General;  Laterality: N/A;  . Eus N/A 11/21/2012    Procedure: LOWER ENDOSCOPIC ULTRASOUND (EUS);  Surgeon: Arta Silence, MD;  Location: [facility];  Service: Endoscopy;  Laterality: N/A;  . Flexible sigmoidoscopy N/A 11/21/2012    Procedure: FLEXIBLE SIGMOIDOSCOPY;  Surgeon: Arta Silence, MD;  Location: [facility];  Service: Endoscopy;  Laterality: N/A;  . Flexible sigmoidoscopy N/A 01/28/2013    Procedure: FLEXIBLE SIGMOIDOSCOPY;  Surgeon: Leighton Ruff, MD;  Location: [facility];  Service: Endoscopy;  Laterality: N/A;  . Laparoscopic low anterior resection N/A 01/29/2013    Procedure: LAPAROSCOPIC LOW ANTERIOR RESECTION, Rigid Proctoscopy;  Surgeon: Leighton Ruff, MD;  Location: [facility];  Service: General;  Laterality: N/A;   Family History  Problem Relation Age of Onset  . Hypertension Father   . Stroke Father   . Diabetes Father   . Heart disease Mother   . Hypertension Mother   . Hyperlipidemia Mother   . Hypertension Sister   . Hypertension Brother   . Hypertension Sister   . Hypertension Brother    History  Substance Use Topics  . Smoking status: Current Every Day Smoker --  0.25 packs/day for 20 years    Types: Cigarettes  . Smokeless tobacco: Never Used     Comment: one every 2-3 days  . Alcohol Use: 0.0 oz/week     Comment: 3 cans beer a week      pt states crack cocaine was 5 or 6 yrs ago    OB History    Gravida Para Term Preterm AB TAB SAB Ectopic Multiple Living   _0 Review of Systems  Constitutional: Positive for fever, chills and fatigue.  HENT: Positive for congestion and sore throat.   Respiratory: Positive for cough and shortness of breath.   Cardiovascular: Negative for  chest pain.  Gastrointestinal: Positive for nausea, vomiting and diarrhea (chronic ). Negative for abdominal pain.  All other systems reviewed and are negative.   Allergies  Penicillins  Home Medications   Prior to Admission medications   Medication Sig Start Date End Date Taking? Authorizing Provider  albuterol (PROVENTIL HFA;VENTOLIN HFA) 108 (90 BASE) MCG/ACT inhaler Inhale 2 puffs into the lungs every 4 (four) hours as needed for wheezing.   Yes Historical Provider, MD  amLODipine (NORVASC) 10 MG tablet Take 10 mg by mouth every morning.  09/24/12  Yes Historical Provider, MD  baclofen (LIORESAL) 10 MG tablet Take 10 mg by mouth daily as needed (for stomach).  10/04/12  Yes Historical Provider, MD  citalopram (CELEXA) 20 MG tablet Take 20 mg by mouth every morning.  09/28/12  Yes Historical Provider, MD  gabapentin (NEURONTIN) 300 MG capsule Take 300 mg by mouth 2 (two) times daily.  08/09/12  Yes Historical Provider, MD  hydrochlorothiazide (MICROZIDE) 12.5 MG capsule Take 12.5 mg by mouth every morning.   Yes Historical Provider, MD  metFORMIN (GLUCOPHAGE) 1000 MG tablet Take 1,000 mg by mouth 2 (two) times daily with a meal.   Yes Historical Provider, MD  pantoprazole (PROTONIX) 40 MG tablet Take 40 mg by mouth daily.   Yes Historical Provider, MD  pravastatin (PRAVACHOL) 10 MG tablet Take 10 mg by mouth daily.  07/02/13  Yes Historical Provider, MD  traMADol (ULTRAM) 50 MG tablet Take 50 mg by mouth every 6 (six) hours as needed for pain.  08/09/12  Yes Historical Provider, MD  guaiFENesin-codeine 100-10 MG/5ML syrup Take 5 mLs by mouth every 4 (four) hours as needed for cough. 04/18/14   Liam Graham, PA-C  HYDROcodone-acetaminophen (NORCO/VICODIN) 5-325 MG per tablet Take 1-2 tablets by mouth every 6 (six) hours as needed. 09/24/13   Montine Circle, PA-C  levofloxacin (LEVAQUIN) 500 MG tablet Take 1 tablet (500 mg total) by mouth daily. 04/18/14   Liam Graham, PA-C  meloxicam  (MOBIC) 15 MG tablet daily. 11/14/13   Historical Provider, MD   BP 166/87 mmHg  Pulse 58  Temp(Src) 100.4 F (38 C) (Oral)  Resp 18  SpO2 100% Physical Exam  Constitutional: She is oriented to person, place, and time. Vital signs are normal. She appears well-developed and well-nourished. No distress.  HENT:  Head: Normocephalic and atraumatic.  Right Ear: External ear normal.  Left Ear: External ear normal.  Nose: Nose normal. Right sinus exhibits no maxillary sinus tenderness and no frontal sinus tenderness. Left sinus exhibits no maxillary sinus tenderness and no frontal sinus tenderness.  Mouth/Throat: Oropharynx is clear and moist. No oropharyngeal exudate.  Eyes: Conjunctivae are normal. Right eye exhibits no discharge. Left eye exhibits no discharge.  Neck: Normal range  of motion. Neck supple.  Cardiovascular: Normal rate, regular rhythm and normal heart sounds.   Pulmonary/Chest: Effort normal. No respiratory distress. She has no wheezes. She has no rhonchi. She has rales in the right lower field.  Lymphadenopathy:    She has no cervical adenopathy.  Neurological: She is alert and oriented to person, place, and time. She has normal strength. Coordination normal.  Skin: Skin is warm and dry. No rash noted. She is not diaphoretic.  Psychiatric: She has a normal mood and affect. Judgment normal.  Nursing note and vitals reviewed.   ED Course  Procedures (including critical care time) Labs Review Labs Reviewed  POCT I-STAT, CHEM 8 - Abnormal; Notable for the following:    Glucose, Bld 179 (*)    Hemoglobin 11.9 (*)    HCT 35.0 (*)    All other components within normal limits    Imaging Review Dg Chest 2 View  04/18/2014   CLINICAL DATA:  Fever and cough  EXAM: CHEST  2 VIEW  COMPARISON:  03/11/2014  FINDINGS: Cardiac shadow is within normal limits. The lungs are well aerated bilaterally with and mildly hyperinflated. No focal infiltrate or sizable effusion is seen. No bony  abnormality is noted.  IMPRESSION: COPD without acute abnormality.   Electronically Signed   By: Inez Catalina M.D.   On: 04/18/2014 15:53     MDM   1. COPD with exacerbation    She has a recent CT scan of the chest showing emphysematous changes in the lungs and early interstitial lung disease.   XR shows COPD, no consolidation.  Treat As an exacerbation of COPD. She has poorly controlled diabetes, avoid prednisone in this patient. Will treat with antibiotic and cough suppressant. She will follow-up with her primary care physician for adjustment of her diabetes treatment. Emergency department if worsening   Meds ordered this encounter  Medications  . levofloxacin (LEVAQUIN) 500 MG tablet    Sig: Take 1 tablet (500 mg total) by mouth daily.    Dispense:  7 tablet    Refill:  0    Order Specific Question:  Supervising Provider    Answer:  Billy Fischer [phone]  . guaiFENesin-codeine 100-10 MG/5ML syrup    Sig: Take 5 mLs by mouth every 4 (four) hours as needed for cough.    Dispense:  120 mL    Refill:  0    Order Specific Question:  Supervising Provider    Answer:  Ihor Gully D [[id number]]     Liam Graham, PA-C 04/18/14 1610

## 2014-04-18 NOTE — ED Notes (Signed)
Pt started with a cold two weeks ago.  Pt states it has gotten worse with the cough being the most troublesome.  She states her throat has been sore and now she is hoarse.

## 2014-06-11 ENCOUNTER — Other Ambulatory Visit (INDEPENDENT_AMBULATORY_CARE_PROVIDER_SITE_OTHER): Payer: Self-pay | Admitting: General Surgery

## 2014-06-11 DIAGNOSIS — C20 Malignant neoplasm of rectum: Secondary | ICD-10-CM

## 2014-07-01 ENCOUNTER — Other Ambulatory Visit (HOSPITAL_COMMUNITY): Payer: Self-pay | Admitting: Internal Medicine

## 2014-07-01 DIAGNOSIS — Z1231 Encounter for screening mammogram for malignant neoplasm of breast: Secondary | ICD-10-CM

## 2014-07-08 ENCOUNTER — Ambulatory Visit (HOSPITAL_COMMUNITY): Payer: Medicare Other

## 2014-07-10 ENCOUNTER — Ambulatory Visit (HOSPITAL_COMMUNITY)
Admission: RE | Admit: 2014-07-10 | Discharge: 2014-07-10 | Disposition: A | Discharge status: Home / Self Care | Payer: Medicare Other | Source: Ambulatory Visit | Attending: Internal Medicine | Admitting: Internal Medicine

## 2014-07-10 DIAGNOSIS — Z1231 Encounter for screening mammogram for malignant neoplasm of breast: Secondary | ICD-10-CM | POA: Diagnosis present

## 2014-07-10 IMAGING — MG MM SCREENING BREAST TOMO BILATERAL
8 series · 8 of 24 positions shown · non-contrast
Comparison: Previous exam(s)

ACR Breast Density Category a: The breast tissue is almost entirely
fatty.

CLINICAL DATA: Screening.

EXAM:
DIGITAL SCREENING BILATERAL MAMMOGRAM WITH 3D TOMO WITH CAD

[R CC]
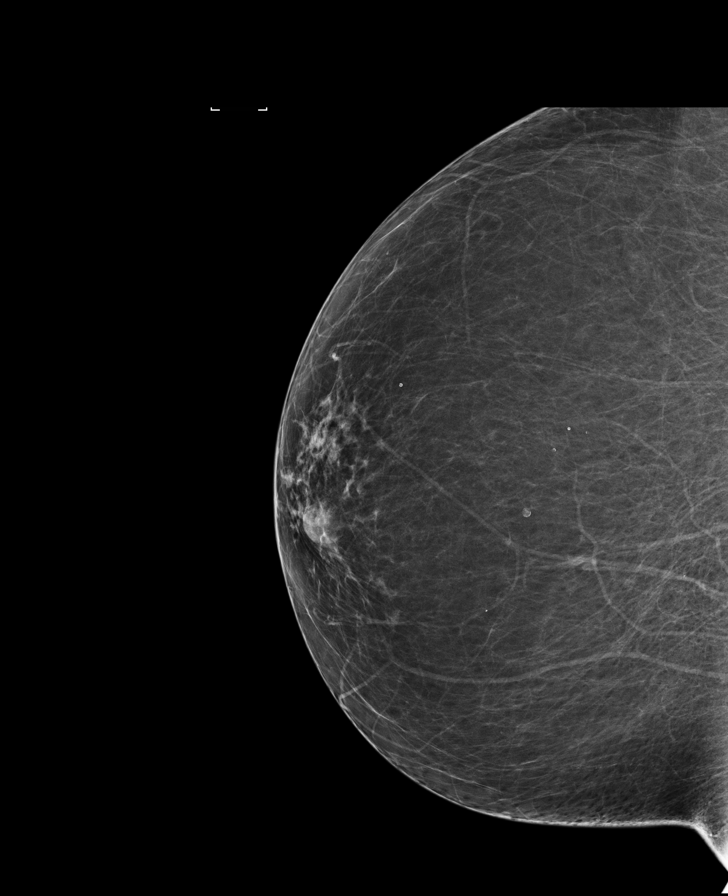

[L CC]
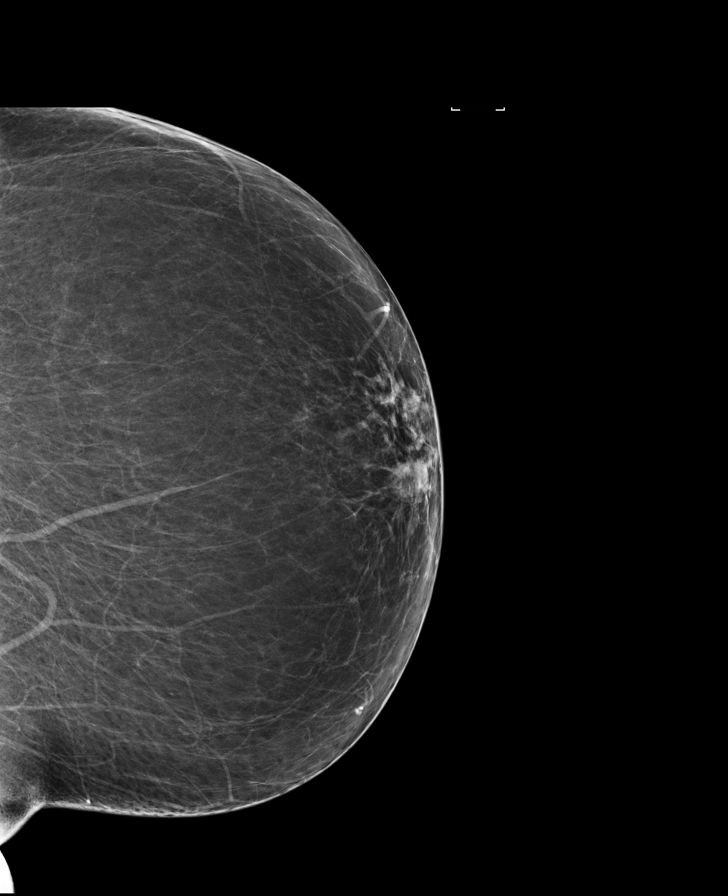

[L MLO]
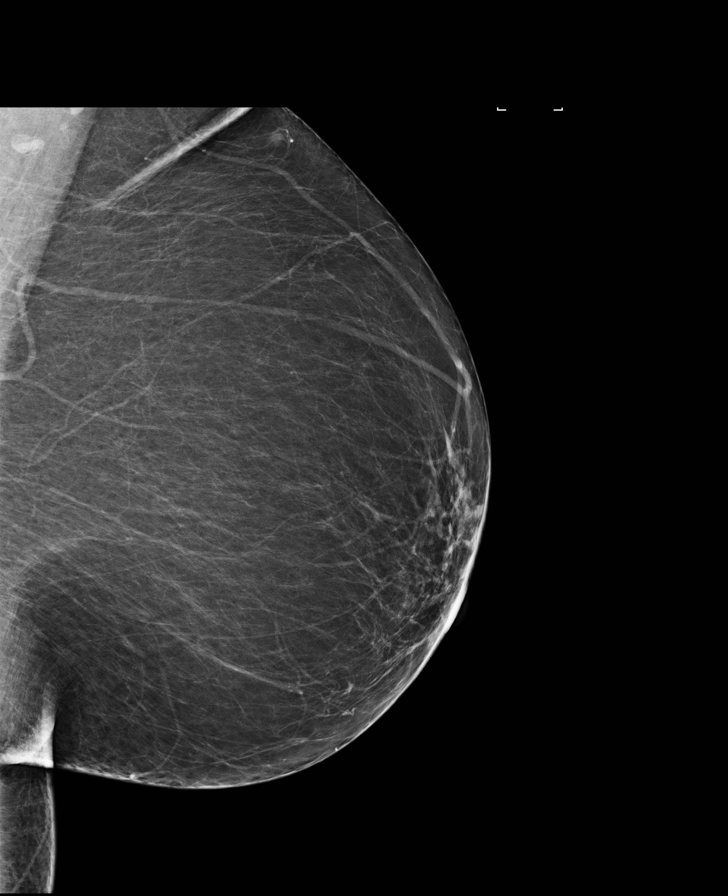

[R MLO]
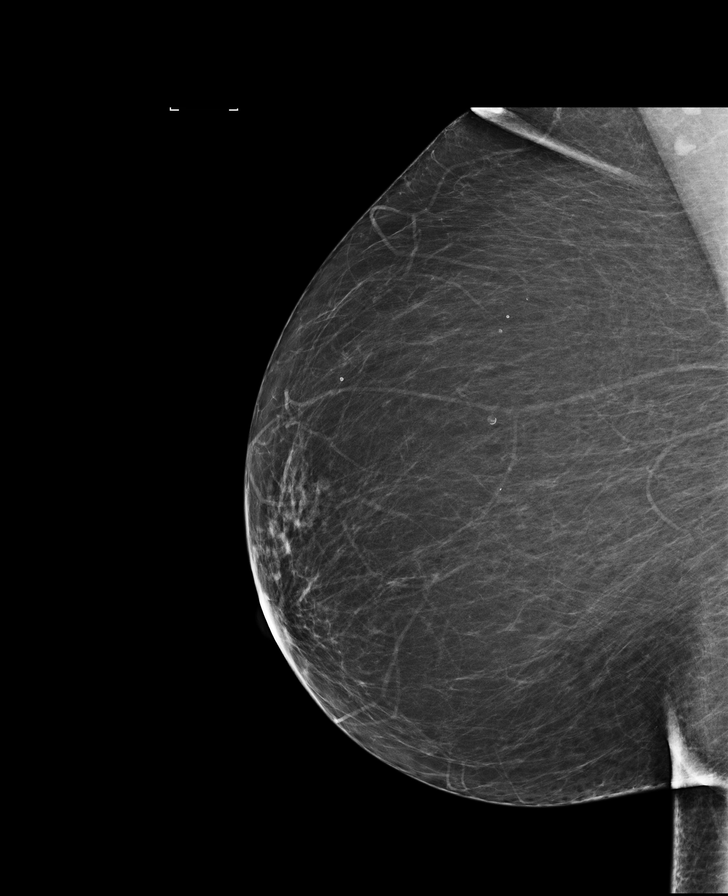

[L CC tomo · tomo slice 37/72.0]
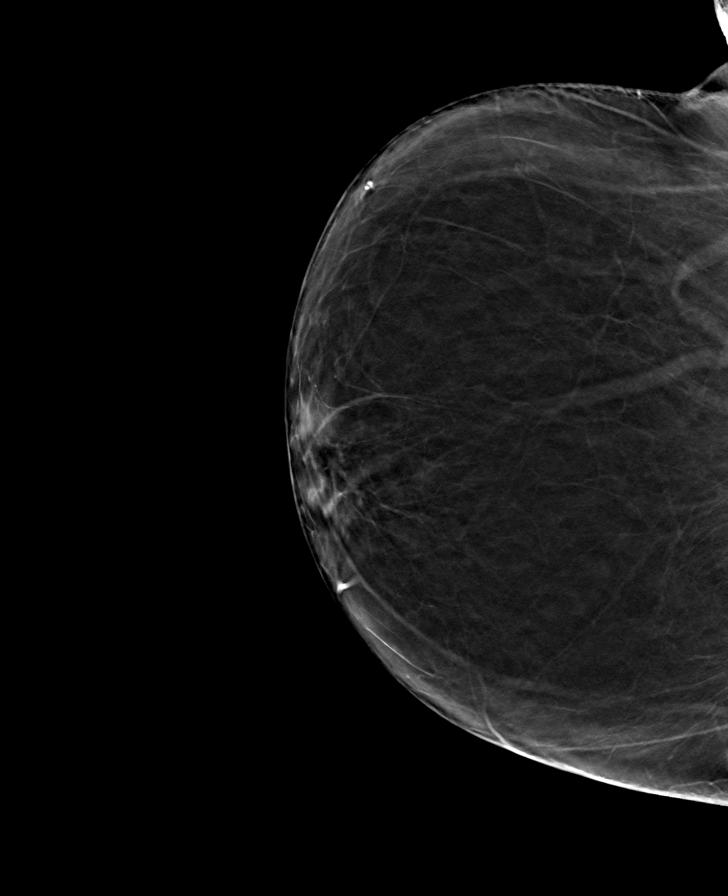

[R CC tomo · tomo slice 39/77.0]
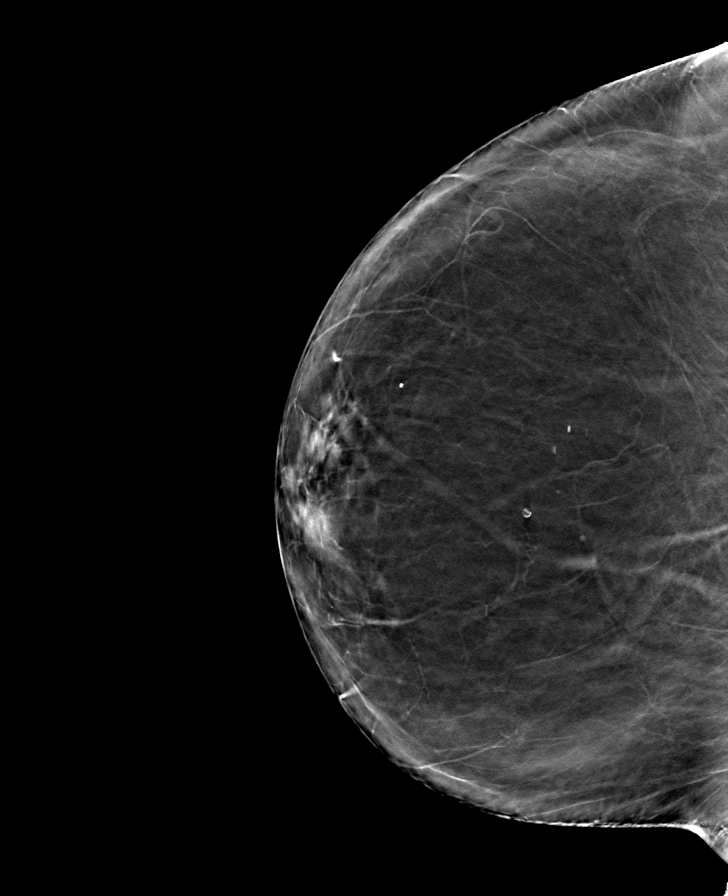

[R MLO tomo · tomo slice 41/82.0]
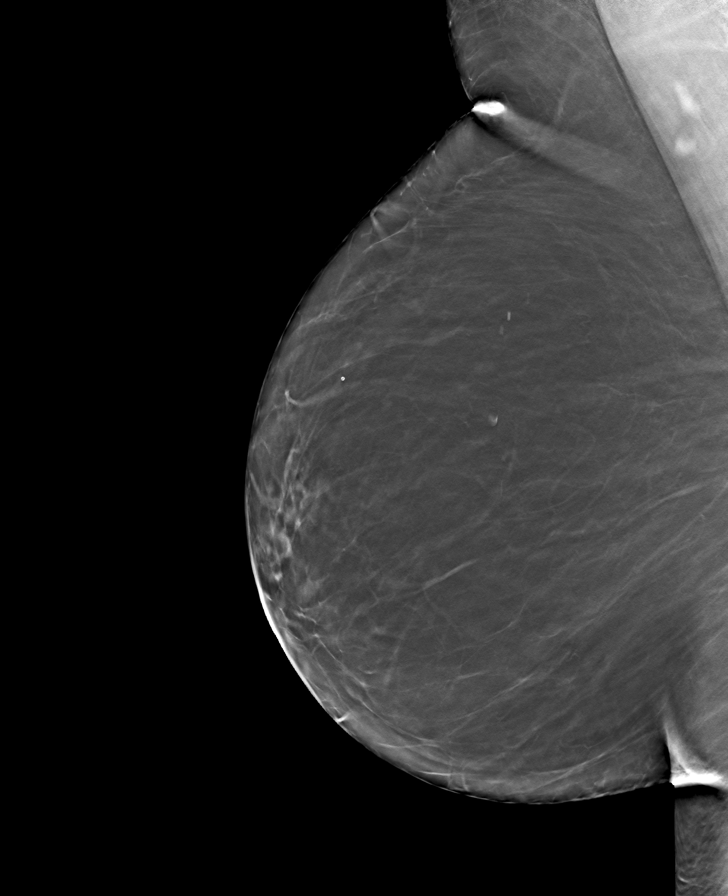

[L MLO tomo · tomo slice 40/79.0]
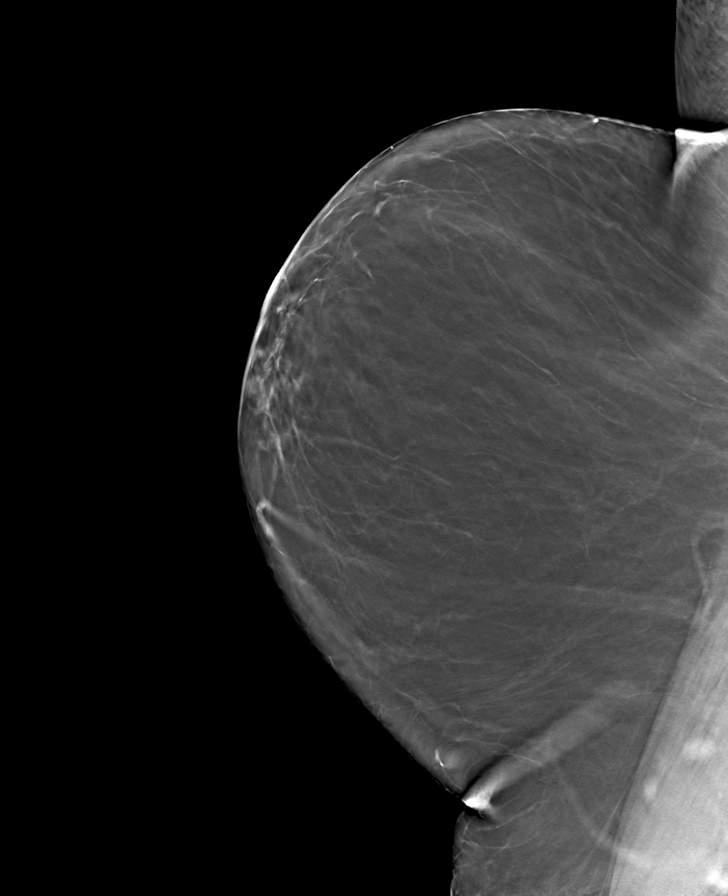

[8 of 24 positions shown; findings below may reference images not displayed]

FINDINGS: There are no findings suspicious for malignancy. Images were
processed with CAD.
IMPRESSION: No mammographic evidence of malignancy. A result letter of this
screening mammogram will be mailed directly to the patient.

RECOMMENDATION:
Screening mammogram in one year. (Code:[[place]])

BI-RADS CATEGORY  1: Negative.

## 2014-07-25 ENCOUNTER — Encounter: Payer: Self-pay | Admitting: *Deleted

## 2014-07-25 ENCOUNTER — Ambulatory Visit: Payer: Medicare Other | Admitting: Obstetrics and Gynecology

## 2014-07-25 ENCOUNTER — Telehealth: Payer: Self-pay | Admitting: *Deleted

## 2014-07-25 NOTE — Telephone Encounter (Signed)
Erica [name] missed an appointment for a pap smear/annual exam. Called home number- heard message this person is not accepting calls. Called work number was told she was not there, was at [employer] appointment. Will send letter.

## 2014-09-19 ENCOUNTER — Other Ambulatory Visit: Payer: Self-pay | Admitting: General Surgery

## 2014-09-19 DIAGNOSIS — C20 Malignant neoplasm of rectum: Secondary | ICD-10-CM

## 2014-09-22 ENCOUNTER — Telehealth: Payer: Self-pay | Admitting: *Deleted

## 2014-09-22 NOTE — Telephone Encounter (Signed)
Left VM for patient to call office regarding an appointment for Friday. Gave direct # to navigator office.

## 2014-09-23 ENCOUNTER — Telehealth: Payer: Self-pay | Admitting: *Deleted

## 2014-09-23 NOTE — Telephone Encounter (Signed)
Spoke with patient and provided new patient appointment for 09/26/14 to arrive at 0900 for 0930 appointment with Dr. Burr Medico. Informed of location of Power, valet service, and registration process. Reminded to bring insurance cards and a current medication list, including supplements. Patient verbalizes understanding.

## 2014-09-26 ENCOUNTER — Encounter: Payer: Self-pay | Admitting: Physical Therapy

## 2014-09-26 ENCOUNTER — Ambulatory Visit: Payer: Medicare Other | Attending: Hematology | Admitting: Physical Therapy

## 2014-09-26 ENCOUNTER — Ambulatory Visit: Payer: Medicare Other | Admitting: Nutrition

## 2014-09-26 ENCOUNTER — Encounter: Payer: Self-pay | Admitting: Hematology

## 2014-09-26 ENCOUNTER — Telehealth: Payer: Self-pay | Admitting: Hematology

## 2014-09-26 ENCOUNTER — Encounter: Payer: Self-pay | Admitting: *Deleted

## 2014-09-26 ENCOUNTER — Other Ambulatory Visit (HOSPITAL_BASED_OUTPATIENT_CLINIC_OR_DEPARTMENT_OTHER): Payer: Medicare Other

## 2014-09-26 ENCOUNTER — Ambulatory Visit: Payer: Medicare Other

## 2014-09-26 ENCOUNTER — Ambulatory Visit (HOSPITAL_BASED_OUTPATIENT_CLINIC_OR_DEPARTMENT_OTHER): Payer: Medicare Other | Admitting: Hematology

## 2014-09-26 VITALS — BP 157/83 | HR 74 | Temp 97.8°F | Resp 19 | Ht 63.0 in | Wt 174.4 lb

## 2014-09-26 DIAGNOSIS — E119 Type 2 diabetes mellitus without complications: Secondary | ICD-10-CM | POA: Insufficient documentation

## 2014-09-26 DIAGNOSIS — C20 Malignant neoplasm of rectum: Secondary | ICD-10-CM | POA: Diagnosis not present

## 2014-09-26 DIAGNOSIS — I10 Essential (primary) hypertension: Secondary | ICD-10-CM | POA: Insufficient documentation

## 2014-09-26 DIAGNOSIS — K219 Gastro-esophageal reflux disease without esophagitis: Secondary | ICD-10-CM | POA: Insufficient documentation

## 2014-09-26 DIAGNOSIS — M6289 Other specified disorders of muscle: Secondary | ICD-10-CM

## 2014-09-26 DIAGNOSIS — G629 Polyneuropathy, unspecified: Secondary | ICD-10-CM | POA: Diagnosis not present

## 2014-09-26 DIAGNOSIS — J45909 Unspecified asthma, uncomplicated: Secondary | ICD-10-CM | POA: Insufficient documentation

## 2014-09-26 DIAGNOSIS — N8184 Pelvic muscle wasting: Secondary | ICD-10-CM | POA: Diagnosis present

## 2014-09-26 DIAGNOSIS — Z85048 Personal history of other malignant neoplasm of rectum, rectosigmoid junction, and anus: Secondary | ICD-10-CM

## 2014-09-26 DIAGNOSIS — R970 Elevated carcinoembryonic antigen [CEA]: Secondary | ICD-10-CM

## 2014-09-26 LAB — CBC & DIFF AND RETIC
BASO%: 0.2 % (ref 0.0–2.0)
Basophils Absolute: 0 10*3/uL (ref 0.0–0.1)
EOS%: 2.1 % (ref 0.0–7.0)
Eosinophils Absolute: 0.1 10*3/uL (ref 0.0–0.5)
HCT: 34.5 % — ABNORMAL LOW (ref 34.8–46.6)
HGB: 12.5 g/dL (ref 11.6–15.9)
Immature Retic Fract: 8 % (ref 1.60–10.00)
LYMPH%: 21.9 % (ref 14.0–49.7)
MCH: 33.8 pg (ref 25.1–34.0)
MCHC: 36.2 g/dL — ABNORMAL HIGH (ref 31.5–36.0)
MCV: 93.2 fL (ref 79.5–101.0)
MONO#: 0.3 10*3/uL (ref 0.1–0.9)
MONO%: 7.4 % (ref 0.0–14.0)
NEUT#: 3 10*3/uL (ref 1.5–6.5)
NEUT%: 68.4 % (ref 38.4–76.8)
Platelets: 202 10*3/uL (ref 145–400)
RBC: 3.7 10*6/uL (ref 3.70–5.45)
RDW: 15.1 % — ABNORMAL HIGH (ref 11.2–14.5)
Retic %: 3.7 % — ABNORMAL HIGH (ref 0.70–2.10)
Retic Ct Abs: 136.9 10*3/uL — ABNORMAL HIGH (ref 33.70–90.70)
WBC: 4.3 10*3/uL (ref 3.9–10.3)
lymph#: 1 10*3/uL (ref 0.9–3.3)

## 2014-09-26 LAB — COMPREHENSIVE METABOLIC PANEL (CC13)
ALT: 11 U/L (ref 0–55)
AST: 16 U/L (ref 5–34)
Albumin: 3.4 g/dL — ABNORMAL LOW (ref 3.5–5.0)
Alkaline Phosphatase: 100 U/L (ref 40–150)
Anion Gap: 11 mEq/L (ref 3–11)
BUN: 21.5 mg/dL (ref 7.0–26.0)
CO2: 22 mEq/L (ref 22–29)
Calcium: 7.3 mg/dL — ABNORMAL LOW (ref 8.4–10.4)
Chloride: 106 mEq/L (ref 98–109)
Creatinine: 1.3 mg/dL — ABNORMAL HIGH (ref 0.6–1.1)
EGFR: 52 mL/min/{1.73_m2} — ABNORMAL LOW (ref >90)
Glucose: 193 mg/dl — ABNORMAL HIGH (ref 70–140)
Potassium: 4.5 mEq/L (ref 3.5–5.1)
Sodium: 139 mEq/L (ref 136–145)
Total Bilirubin: 1.26 mg/dL — ABNORMAL HIGH (ref 0.20–1.20)
Total Protein: 8 g/dL (ref 6.4–8.3)

## 2014-09-26 NOTE — Progress Notes (Signed)
[facility]  [phone] Fax:([phone]) [phone]  Clinic New Consult Note   Patient Care Team: Nolene Ebbs, MD as PCP - General (Internal Medicine) 09/26/2014  CHIEF COMPLAINTS/PURPOSE OF CONSULTATION:  History of rectal cancer, arising CEA    Oncology History   Rectal cancer   Staging form: Colon and Rectum, AJCC 7th Edition     Pathologic: Stage I (T1, N0, cM0) - Signed by Truitt Merle, MD on 09/26/2014       Rectal cancer   09/13/2012 Procedure Colonoscopy & endorectal ultrasound-Dr. Outlaw=Distal colonic polyp   11/14/2012 Procedure Sigmoidoscopy & Laparoscopy=Dr. Donne Hazel   01/29/2013 Pathologic Stage pT1N0cM0, stage I, well-differentiated adenocarcinoma arising in background of tubular villous adenoma, invading into the submucosa. No evidence of angiolymphatic invasion. 18 lymph nodes were negative.   01/29/2013 Surgery Rectosigmoid segmental resection by Dr. Marcello Moores. Surgical margin was negative.   02/04/2013 Initial Diagnosis Rectal cancer   03/11/2014 Imaging CT C/A/P=No acute abdominal/pelvic findings, mass lesions or adenopathy. No findings to suggest recurrent rectal cancer and no adenopathy or metastatic disease. 2. Emphysematous changes. 3. Suspect early interstitial lung disease. 4. Tortuous and mil   09/12/2014 Tumor Marker CEA=6.4    HISTORY OF PRESENTING ILLNESS:  Erica [name] 59 y.o. female is here because of her history of stage I rectal cancer and recent abnormal CEA. She presents with her sister to the clinic today.   Her rectal cancer was diagnosed in August 2014 by screening colonoscopy. She was found to have multiple colon polyps in April 2014, which were removed by colonoscopy and pathology showedtubular or tubulovillous adenoma, and a rectal polyp biopsy showed high-grade dysplasia. She underwent LAR on 01/29/2013, surgical pathology showed invasive well-differentiated adenocarcinoma, pT1N0, 8 lymph nodes were negative. She has been followed by  Dr. Marcello Moores since then. Her last CTs scan was in September 2015 which was negative for disease recurrence, and colonoscopy on 02/04/2014 showed no evidence of disease recurrence, except 3 colon polyps which were removed. Her CEA however on 09/12/2014 was elevated at 6.4. She was referred by Dr. Marcello Moores for further evaluation.  She has some residual low abdominal pain since the surgery, she has loose BM 2-4 times daily, no blood, appetite and energy level is good, no significant change lately, except low back pain for the past 6 month, which is 6/10, no radiation pain. No recently weight loss.   MEDICAL HISTORY:  Past Medical History  Diagnosis Date  . Diabetes mellitus   . Hypertension   . Mental disorder     depression  . Depression   . Asthma   . GERD (gastroesophageal reflux disease)   . Headache(784.0)     "mild"  . Neuropathy     feet   . Arthritis     especially in shoulders  . Rectal polyp     very little bleeding with bowel movements- no pain  . Pain     arthritis pain - takes tramadol as needed    SURGICAL HISTORY: Past Surgical History  Procedure Laterality Date  . Tonsillectomy and adenoidectomy    . Cesarean section    . Tonsillectomy    . Laparoscopic sigmoid colectomy N/A 11/14/2012    Procedure: DIAGNOSTIC LAPAROSCOPY AND SIGMOIDMOIDOSCOPY ;  Surgeon: Rolm Bookbinder, MD;  Location: [facility];  Service: General;  Laterality: N/A;  . Eus N/A 11/21/2012    Procedure: LOWER ENDOSCOPIC ULTRASOUND (EUS);  Surgeon: Arta Silence, MD;  Location: [facility];  Service: Endoscopy;  Laterality: N/A;  .  Flexible sigmoidoscopy N/A 11/21/2012    Procedure: FLEXIBLE SIGMOIDOSCOPY;  Surgeon: Arta Silence, MD;  Location: [facility];  Service: Endoscopy;  Laterality: N/A;  . Flexible sigmoidoscopy N/A 01/28/2013    Procedure: FLEXIBLE SIGMOIDOSCOPY;  Surgeon: Leighton Ruff, MD;  Location: [facility];  Service: Endoscopy;  Laterality: N/A;  . Laparoscopic low anterior resection  N/A 01/29/2013    Procedure: LAPAROSCOPIC LOW ANTERIOR RESECTION, Rigid Proctoscopy;  Surgeon: Leighton Ruff, MD;  Location: [facility];  Service: General;  Laterality: N/A;    SOCIAL HISTORY: History   Social History  . Marital Status: Single    Spouse Name: N/A  . Number of Children: One son, 33   . Years of Education: N/A   Occupational History  . Disabled due to depression, DM    Social History Main Topics  . Smoking status: Current Every Day Smoker -- 0.25 packs/day for 20 years    Types: Cigarettes  . Smokeless tobacco: Never Used     Comment: one every 2-3 days  . Alcohol Use: 0.0 oz/week     Comment: 3 cans beer a week      pt states crack cocaine was 5 or 6 yrs ago   . Drug Use: Yes    Special: "Crack" cocaine     Comment: last time was a year ago   . Sexual Activity: No   Other Topics Concern  . Not on file   Social History Narrative    FAMILY HISTORY: Family History  Problem Relation Age of Onset  . Hypertension Father   . Stroke Father   . Diabetes Father   . Heart disease Mother   . Hypertension Mother   . Hyperlipidemia Mother   . Hypertension Sister   . Hypertension Brother   . Hypertension Sister   . Hypertension Brother     ALLERGIES:  is allergic to penicillins.  MEDICATIONS:  Current Outpatient Prescriptions  Medication Sig Dispense Refill  . albuterol (PROVENTIL HFA;VENTOLIN HFA) 108 (90 BASE) MCG/ACT inhaler Inhale 2 puffs into the lungs every 4 (four) hours as needed for wheezing.    Marland Kitchen amLODipine (NORVASC) 10 MG tablet Take 10 mg by mouth every morning.     . baclofen (LIORESAL) 10 MG tablet Take 10 mg by mouth daily as needed (for stomach).     . citalopram (CELEXA) 20 MG tablet Take 20 mg by mouth every morning.     . gabapentin (NEURONTIN) 300 MG capsule Take 300 mg by mouth 2 (two) times daily.     Marland Kitchen guaiFENesin-codeine 100-10 MG/5ML syrup Take 5 mLs by mouth every 4 (four) hours as needed for cough. 120 mL 0  . hydrochlorothiazide  (MICROZIDE) 12.5 MG capsule Take 12.5 mg by mouth every morning.    Marland Kitchen HYDROcodone-acetaminophen (NORCO/VICODIN) 5-325 MG per tablet Take 1-2 tablets by mouth every 6 (six) hours as needed. 15 tablet 0  . levofloxacin (LEVAQUIN) 500 MG tablet Take 1 tablet (500 mg total) by mouth daily. 7 tablet 0  . meloxicam (MOBIC) 15 MG tablet daily.    . metFORMIN (GLUCOPHAGE) 1000 MG tablet Take 1,000 mg by mouth 2 (two) times daily with a meal.    . pantoprazole (PROTONIX) 40 MG tablet Take 40 mg by mouth daily.    . pravastatin (PRAVACHOL) 10 MG tablet Take 10 mg by mouth daily.     . traMADol (ULTRAM) 50 MG tablet Take 50 mg by mouth every 6 (six) hours as needed for pain.  No current facility-administered medications for this visit.    REVIEW OF SYSTEMS:   Constitutional: Denies fevers, chills or abnormal night sweats Eyes: Denies blurriness of vision, double vision or watery eyes Ears, nose, mouth, throat, and face: Denies mucositis or sore throat Respiratory: Denies cough, dyspnea or wheezes Cardiovascular: Denies palpitation, chest discomfort or lower extremity swelling Gastrointestinal:  Denies nausea, heartburn or change in bowel habits Skin: Denies abnormal skin rashes Lymphatics: Denies new lymphadenopathy or easy bruising Neurological:Denies numbness, tingling or new weaknesses Behavioral/Psych: Mood is stable, no new changes  All other systems were reviewed with the patient and are negative.  PHYSICAL EXAMINATION: ECOG PERFORMANCE STATUS: 1 - Symptomatic but completely ambulatory  There were no vitals filed for this visit. There were no vitals filed for this visit.  GENERAL:alert, no distress and comfortable SKIN: skin color, texture, turgor are normal, no rashes or significant lesions EYES: normal, conjunctiva are pink and non-injected, sclera clear OROPHARYNX:no exudate, no erythema and lips, buccal mucosa, and tongue normal  NECK: supple, thyroid normal size, non-tender,  without nodularity LYMPH:  no palpable lymphadenopathy in the cervical, axillary or inguinal LUNGS: clear to auscultation and percussion with normal breathing effort HEART: regular rate & rhythm and no murmurs and no lower extremity edema ABDOMEN:abdomen soft, mild tenderness at the low pelvic area, bo rebound pain, and normal bowel sounds Musculoskeletal:no cyanosis of digits and no clubbing  PSYCH: alert & oriented x 3 with fluent speech NEURO: no focal motor/sensory deficits  LABORATORY DATA:  I have reviewed the data as listed Lab Results  Component Value Date   WBC 11.0* 02/08/2013   HGB 11.9* 04/18/2014   HCT 35.0* 04/18/2014   MCV 87.8 02/08/2013   PLT 252 02/08/2013    Recent Labs  04/18/14 1500  NA 141  K 4.5  CL 104  GLUCOSE 179*  BUN 14  CREATININE 1.10   PATHOLOGY REPORT:  Diagnosis 02/04/2014 Surgical [P], ascending, transverse, descending, polyp - FRAGMENTS OF TUBULAR ADENOMAS (X3); NEGATIVE FOR HIGH GRADE DYSPLASIA OR MALIGNANCY.  Diagnosis 01/29/2013 1. Colon, segmental resection, rectosigmoid - INVASIVE WELL DIFFERENTIATED ADENOCARCINOMA ARISING IN BACKGROUND OF TUBULOVILLOUS ADENOMA, INVADING INTO THE SUBMUCOSA. - NO EVIDENCE OF ANGIOLYMPHATIC INVASION IDENTIFIED. - EIGHT LYMPH NODES, NEGATIVE FOR METASTATIC CARCINOMA (0/8 ), PLEASE SEE COMMENT - MULTIPLE SERRATED POLYPS/ADENOMA. - RESECTION MARGINS, NEGATIVE FOR ATYPIA OR MALIGNANCY. 2. Colon, resection margin (donut), anastomotic rings - BENIGN COLONIC MUCOSA, NO EVIDENCE OF MALIGNANCY. Microscopic Comment 1. COLON AND RECTUM Specimen: Rectosigmoid colon Procedure: Segmental resection Tumor site: Upper rectum Specimen integrity: Intact Macroscopic intactness of mesorectum: Near complete Macroscopic tumor perforation: No Invasive tumor: Maximum size: 1 cm Histologic type(s): Invasive adenocarcinoma Histologic grade and differentiation: G1: well differentiated/low grade Type of polyp in  which invasive carcinoma arose: Tubulovillous adenoma Microscopic extension of invasive tumor: Invading into submucosa Lymph-Vascular invasion: Not identified Peri-neural invasion: Not identified Tumor deposit(s) (discontinuous extramural extension): No Resection margins: Proximal: 11.2 cm Distal: 2.5 cm Radial: 1.6 cm Treatment effect (neoadjuvant therapy): Not identified Number of lymph nodes examined 6+2=8 ; Number positive 0 Additional polyp(s): Multiple serrated polyps. Non-neoplastic findings: Not identified Pathologic Staging: T1, N0, Mx Ancillary studies: The tumor block was sent to [facility] for MSI testing. Comment: The pericolonic fatty tissue was submitted for clearing process. Six lymph nodes were identified and sections show no evidence of metastatic carcinoma. Per Dr. Leighton Ruff' request, ten additional tissue blocks of pericolonic fatty tissue were submitted for microscopic examination. Two tiny small lymph nodes were identified  microscopically and show no evidence of metastatic carcinoma. Case was discussed with Dr. Marcello Moores on 02-04-2013. RADIOGRAPHIC STUDIES: I have personally reviewed the radiological images as listed and agreed with the findings in the report. No results found.  ASSESSMENT & PLAN:  59 year old female, with past medical history of stage I rectal adenocarcinoma in August 2014, status post complete surgical resection. Now presents with a rising CEA.  1. History of rectal adenocarcinoma, stage I, ruled out recurrence -I reviewed her last colonoscopy, CT scan, and lab findings with patient and her sister in great details. -Giving the early stage rectal cancer, normal colonoscopy in August 2015, negative CT scan in September 2015, the possibility of cancer recurrence is not very high. However giving the right CVA, cancer recurrence or secondary GI malignancy needs to be ruled out -I recommend to repeat her CEA today, and obtain a PET scan for further  evaluation. -Depends on the PET scan finding, she may need further GI workup or biopsy if the PET scan is abnormal. -If the PET scan is negative, I recommend her to be followed closely, with repeating CEA every 3 months, and repeating colonoscopy and CT scan in one year.  2. HTN, DM, depression -Follow up with primary care physician  Follow-up: I'll plan to see her back after PET scan.  All questions were answered. The patient knows to call the clinic with any problems, questions or concerns. I spent 45 minutes counseling the patient face to face. The total time spent in the appointment was 60 minutes and more than 50% was on counseling.     Truitt Merle, MD 09/26/2014 7:30 AM

## 2014-09-26 NOTE — Telephone Encounter (Signed)
Appointments by amber,and avs printed for patient

## 2014-09-26 NOTE — Progress Notes (Signed)
Patient is seen in GI Clinic.  59 year old female diagnosed with Rectal cancer, stage 1 in August 2014 now with rising CEA.  PMH includes HTN, DM, depression, asthma, GERD.  Medications include celexa, glucophage, protonix.  Labs were reviewed.  Height:  63 inches. Weight: 174.4 pounds. UBW: 175 pounds. BMI: 30.9.  Patient noted to have rising CEA.  Patient being evaluated by MD. Patient denies problems with oral intake or weight changes. She states she has occasional diarrhea. She describes a lactose intolerance. She presents today with her older sister.  Nutrition Diagnosis:  Food and Nutrition Related Knowledge Deficit related to possible recurrent rectal cancer as evidenced by no prior need for nutrition related knowledge.  Intervention: Educated patient to consume 3 meals with snacks consisting of plant based diet that is CHO controlled. Encouraged increased protein foods. Education provided on eating with diarrhea. Fact sheets given.  Questions answered. Teach back method used.  Monitoring, Evaluation, Goals: Patient will consume healthy plant based diet for glycemic control.  Next Visit:  To be scheduled as needed.

## 2014-09-26 NOTE — Progress Notes (Signed)
Checked in new patient with no issues prior to seeing the dr. She has not traveled.

## 2014-09-26 NOTE — Therapy (Signed)
[name] [address] [place], [place], [address] Phone: [phone]   Fax:  [phone]  Physical Therapy Evaluation  Patient Details  Name: Erica [name] MRN: [id number] Date of Birth: 06-24-1955 Referring Provider:  Truitt Merle, MD  Encounter Date: 09/26/2014      PT End of Session - 09/26/14 1243    Visit Number 1   PT Start Time 0830   PT Stop Time 0900   PT Time Calculation (min) 30 min   Activity Tolerance Patient tolerated treatment well   Behavior During Therapy [facility] for tasks assessed/performed      Past Medical History  Diagnosis Date  . Diabetes mellitus   . Hypertension   . Mental disorder     depression  . Depression   . Asthma   . GERD (gastroesophageal reflux disease)   . Headache(784.0)     "mild"  . Neuropathy     feet   . Arthritis     especially in shoulders  . Rectal polyp     very little bleeding with bowel movements- no pain  . Pain     arthritis pain - takes tramadol as needed    Past Surgical History  Procedure Laterality Date  . Tonsillectomy and adenoidectomy    . Cesarean section    . Tonsillectomy    . Laparoscopic sigmoid colectomy N/A 11/14/2012    Procedure: DIAGNOSTIC LAPAROSCOPY AND SIGMOIDMOIDOSCOPY ;  Surgeon: Rolm Bookbinder, MD;  Location: [facility];  Service: General;  Laterality: N/A;  . Eus N/A 11/21/2012    Procedure: LOWER ENDOSCOPIC ULTRASOUND (EUS);  Surgeon: Arta Silence, MD;  Location: [facility];  Service: Endoscopy;  Laterality: N/A;  . Flexible sigmoidoscopy N/A 11/21/2012    Procedure: FLEXIBLE SIGMOIDOSCOPY;  Surgeon: Arta Silence, MD;  Location: [facility];  Service: Endoscopy;  Laterality: N/A;  . Flexible sigmoidoscopy N/A 01/28/2013    Procedure: FLEXIBLE SIGMOIDOSCOPY;  Surgeon: Leighton Ruff, MD;  Location: [facility];  Service: Endoscopy;  Laterality: N/A;  . Laparoscopic low anterior resection N/A 01/29/2013    Procedure: LAPAROSCOPIC  LOW ANTERIOR RESECTION, Rigid Proctoscopy;  Surgeon: Leighton Ruff, MD;  Location: [facility];  Service: General;  Laterality: N/A;    There were no vitals filed for this visit.  Visit Diagnosis:  Pelvic floor dysfunction - Plan: PT plan of care cert/re-cert      Subjective Assessment - 09/26/14 1237    Subjective Patient is a 59 year old female with diagnosis of rectal cancer on 09/14/2014.  Patient is present in GI clinic.     Currently in Pain? Yes   Pain Score 6    Pain Location Abdomen   Pain Orientation Lower   Pain Descriptors / Indicators Dull   Pain Type Chronic pain   Pain Onset More than a month ago   Pain Frequency Constant   Aggravating Factors  strenous exercise   Pain Relieving Factors rest   Multiple Pain Sites No            [facility] PT Assessment - 09/26/14 0001    Assessment   Medical Diagnosis rectal cancer   Onset Date 09/14/14   Precautions   Precautions Other (comment)   Precaution Comments No ultrasound   Balance Screen   Has the patient fallen in the past 6 months No   Has the patient had a decrease in activity level because of a fear of falling?  No   Is the patient reluctant to leave  their home because of a fear of falling?  No   Prior Function   Level of Independence Independent with basic ADLs   Observation/Other Assessments   Focus on Therapeutic Outcomes (FOTO)  None   AROM   Overall AROM  --  lumbar ROM is full   Strength   Overall Strength --  hip strength is 4/5   Balance   Balance Assessed --  reach 5 inches with body going backwards                           PT Education - 10-11-2014 1242    Education provided Yes   Education Details toileting technique, energy conservation , pelvic floor contraction, skin care of rectum, walking program   Person(s) Educated Patient   Methods Explanation;Demonstration;Verbal cues;Handout   Comprehension Returned demonstration;Verbalized understanding             PT Long  Term Goals - 2014/10/11 1247    PT LONG TERM GOAL #1   Title understand toileting technique, energy conservation, walking program, pelvic floor contraction,  and skin care of rectum.    Time 1   Period Days   Status Achieved               Plan - 10-11-14 1244    Clinical Impression Statement Patient is a 59 year old female with diagnosis of Rectal Cancer. Patient was in GI clinic for education on toileting technique, energy conservation, walking program, pelvic floor contraction,  and skin care of rectum.    Pt will benefit from skilled therapeutic intervention in order to improve on the following deficits --  toileting technique, energy conservation, walking program, pelvic floor contraction,  and skin care of rectum.    Rehab Potential Excellent   PT Frequency 1x / week   PT Duration --  1 time visit   PT Treatment/Interventions Patient/family education;Therapeutic exercise   PT Next Visit Plan after treatments patient may benefit from physical therapy for pelvic floor rehab    PT Home Exercise Plan current HEP   Recommended Other Services None   Consulted and Agree with Plan of Care Patient          G-Codes - 10-11-2014 1247    Functional Assessment Tool Used Therapist discretion   Functional Limitation Other PT primary   Other PT Primary Current Status (I0973) At least 1 percent but less than 20 percent impaired, limited or restricted   Other PT Primary Goal Status (Z3299) At least 1 percent but less than 20 percent impaired, limited or restricted   Other PT Primary Discharge Status (M4268) At least 1 percent but less than 20 percent impaired, limited or restricted       Problem List Patient Active Problem List   Diagnosis Date Noted  . Rectal cancer 01/30/2013    [name],PT 10/11/14, 12:50 PM  [facility] [address], [place], [place], [address] Phone: [phone]   Fax:   [phone]

## 2014-09-26 NOTE — Patient Instructions (Signed)
Skin Care and Bowel Hygiene  Anyone who has frequent bowel movements, diarrhea, or bowel leakage (fecal incontinence) may experience soreness or skin irritation around the anal region.  Occasionally, the skin can become so inflamed that it breaks into open sores.  Prevent skin breakdown by following good skin care habits.  Cleaning and Washing Techniques After having a bowel movement, men and women should tighten their anal sphincter before wiping.  Women should always wipe from front to back to prevent fecal matter from getting into the urethra and vagina.   Tips for Cleaning and Washing . wipe from front to back towards the anus . always wipe gently with soft toilet paper, or ideally with moist toilet paper . wipe only once with each piece of toilet paper so as not to re-contaminate the area . wash in warm water alone or with a minimal amount of mild, fragrance-free soap . use non-biological washing powder . gently pat skin completely dry, avoiding rubbing . if drying the skin after washing is difficult or uncomfortable, try using a hairdryer on a low setting (use very carefully) . allow air to get to the irritated area for some part of every day . use protective skin creams containing zinc as recommended by your doctor  What To Avoid . baths with extra-hot water . soaking for long periods of time in the bathtub . disinfectants and antiseptics  . bath oils, bath salts, and talcum powder . using plastic pants, pads, and sheets, which cause sweating . scratching at the irritated area  Additional Tips . some people find that citrus and acidic foods cause or worsen skin irritation . eat a healthy, balanced diet that is high in fiber  . drink plenty of fluids . wear cotton underwear to allow the skin to breath . talk to your healthcare provider about further treatment options; persistent problems need medical attention   Tips for Energy Conservation for Activities of Daily  Living . Plan ahead to avoid rushing. . Sit down to bathe and dry off. Wear a terry robe instead of drying off. . Use a shower/bath organizer to decrease leaning and reaching. . Use extension handles on sponges and brushes. Susa Simmonds grab rails in the bathroom or use an elevated toilet seat. Hoyle Barr out clothes and toiletries before dressing. . Minimize leaning over to put on clothes and shoes. Bring your foot to your knee to apply socks and shoes. . Wear comfortable shoes and low-heeled, slip on shoes. Wear button front shirts rather than pullovers. Housekeeping . Schedule household tasks throughout the week. . Do housework sitting down when possible. . Delegate heavy housework, shopping, laundry and child care when possible. . Drag or slide objects rather than lifting. . Sit when ironing and take rest periods. . Stop working before becoming overly tired. Shopping . Organize list by aisle. . Use a grocery cart for support. Marland Kitchen Shop at less busy times. . Ask for help with getting to the car. Meal Preparation . Use convenience and easy-to-prepare foods. . Use small appliances that take less effort to use. Marland Kitchen Prepare meals sitting down. . Soak dishes instead of scrubbing and let dishes air dry. . Prepare double portions and freeze half. Child Care . Plan activities that can be done sitting down, such as drawing pictures, playing games, reading, and computer games. . Encourage children to climb up onto your lap or into the highchair instead of being lifted. . Make a game of the household chores so that children  will want to help. . Delegate child care when possible.  Toileting Techniques for Bowel Movements (Defecation) Using your belly (abdomen) and pelvic floor muscles to have a bowel movement is usually instinctive.  Sometimes people can have problems with these muscles and have to relearn proper defecation (emptying) techniques.  If you have weakness in your muscles, organs that are  falling out, decreased sensation in your pelvis, or ignore your urge to go, you may find yourself straining to have a bowel movement.  You are straining if you are: . holding your breath or taking in a huge gulp of air and holding it  . keeping your lips and jaw tensed and closed tightly . turning red in the face because of excessive pushing or forcing . developing or worsening your  hemorrhoids . getting faint while pushing . not emptying completely and have to defecate many times a day  If you are straining, you are actually making it harder for yourself to have a bowel movement.  Many people find they are pulling up with the pelvic floor muscles and closing off instead of opening the anus. Due to lack pelvic floor relaxation and coordination the abdominal muscles, one has to work harder to push the feces out.  Many people have never been taught how to defecate efficiently and effectively.  Notice what happens to your body when you are having a bowel movement.  While you are sitting on the toilet pay attention to the following areas: . Jaw and mouth position . Angle of your hips   . Whether your feet touch the ground or not . Arm placement  . Spine position . Waist . Belly tension . Anus (opening of the anal canal)  An Evacuation/Defecation Plan   Here are the 4 basic points:  1. Lean forward enough for your elbows to rest on your knees 2. Support your feet on the floor or use a low stool if your feet don't touch the floor  3. Push out your belly as if you have swallowed a beach ball--you should feel a widening of your waist 4. Open and relax your pelvic floor muscles, rather than tightening around the anus      The following conditions my require modifications to your toileting posture:  . If you have had surgery in the past that limits your back, hip, pelvic, knee or ankle flexibility . Constipation   Your healthcare practitioner may make the following additional suggestions  and adjustments:  1) Sit on the toilet  a) Make sure your feet are supported. b) Notice your hip angle and spine position--most people find it effective to lean forward or raise their knees, which can help the muscles around the anus to relax  c) When you lean forward, place your forearms on your thighs for support  2) Relax suggestions a) Breath deeply in through your nose and out slowly through your mouth as if you are smelling the flowers and blowing out the candles. b) To become aware of how to relax your muscles, contracting and releasing muscles can be helpful.  Pull your pelvic floor muscles in tightly by using the image of holding back gas, or closing around the anus (visualize making a circle smaller) and lifting the anus up and in.  Then release the muscles and your anus should drop down and feel open. Repeat 5 times ending with the feeling of relaxation. c) Keep your pelvic floor muscles relaxed; let your belly bulge out. d) The digestive tract  starts at the mouth and ends at the anal opening, so be sure to relax both ends of the tube.  Place your tongue on the roof of your mouth with your teeth separated.  This helps relax your mouth and will help to relax the anus at the same time.  3) Empty (defecation) a) Keep your pelvic floor and sphincter relaxed, then bulge your anal muscles.  Make the anal opening wide.  b) Stick your belly out as if you have swallowed a beach ball. c) Make your belly wall hard using your belly muscles while continuing to breathe. Doing this makes it easier to open your anus. d) Breath out and give a grunt (or try using other sounds such as ahhhh, shhhhh, ohhhh or grrrrrrr).  4) Finish a) As you finish your bowel movement, pull the pelvic floor muscles up and in.  This will leave your anus in the proper place rather than remaining pushed out and down. If you leave your anus pushed out and down, it will start to feel as though that is normal and give you  incorrect signals about needing to have a bowel movement.     Ways to get started on an exercise program 1.  Start for 10 minutes per day with a walking program. 2. Work towards 30 minutes of exercise per day 3. When you do an aerobic exercise program start on a low level 4. Water aerobics is a good place due to decreased strain on your joints 5. Begin your exercise program gradually and progress slowly over time 6. When exercising use correct form. a. Keep neutral spine b. Engage abdominals c. Keep chest up  d. Chin down e. Do not lock your knees  Pelvic Floor Exercises for Bowel Control Exercises using both the external anal sphincter and the deep pelvic floor muscles can help you to improve your bowel control. When done correctly, these exercises can tone and strengthen the muscles to help you hold back gas and prevent fecal incontinence (leakage of stool). Exercise programs take time; you may not see any noticeable change in your bowel control immediately.  In some cases it may take several months to regain control. Bowel Control Muscles The anus and the anal canal, has rings of muscle around it. The outer ring of muscle is called the external anal sphincter; it is a voluntary muscle which you can learn to tighten and close more efficiently. When you contract it you will feel the skin around your anus tighten and pull in as if the anus is winking. Try to keep the buttocks muscles relaxed. The inner ring around the anus is the internal anal sphincter. It is an involuntary and automatic muscle; you don't have to think to keep it closed or open.  This muscle should be closed at all times, except when you are actually trying to have a bowel movement.  In addition to the sphincter muscles, there are deeper muscles called the levator ani that form a sling from your tailbone to your pubic bone. The levator ani muscle has a specific part called the puborectalis that holds stool in until you give  the signal to relax and empty.  When you contract these muscles it creates a feeling of lifting the anus inward.   External Anal Sphincter     Levator ani deep layer   Effective Exercises for Control of Gas and Bowels . Identify the specific areas of the pelvic floor muscles you need to use.  This can  be done using a mirror to see if you are contracting the correct muscles or by placing the pad of your finger at or just inside the anal opening. . Develop an exercise plan for strength, endurance and quick response of the muscles and stick with it.  You must make the muscles do more than they are used to doing. . Incorporate the exercises into your daily activities. Patient was instructed today in a home exercise program, posture, and was educated on the importance of a walking program and energy conservation.

## 2014-09-26 NOTE — [facility] Oncology Navigator Note (Signed)
Met with patient and family member during GI [facility] new visit. Explained the role of the GI Nurse Navigator and provided New Patient Packet with information on: 1.  Colorectal cancer 2. Support groups 3. Advanced Directives 4. Fall Safety Plan 5. PET scan  Answered questions, reviewed current treatment plan using TEACH back and provided emotional support. Provided copy of current treatment plan.   , RN, BSN GI Oncology Navigator [facility] 

## 2014-09-26 NOTE — Progress Notes (Signed)
[facility] Psychosocial Distress Screening Clinical Social Work  Clinical Social Work met with and her sister at [facility] to introduce self, review role of CSW and distress screening protocol.The patient scored a 7 on the Psychosocial Distress Thermometer which indicates severe distress. Pt did share her physical concerns with MD. She feels her biggest issues are related to coming up with a treatment plan which has led to additional anxiety. CSW reviewed common emotions and coping techniques. She gets some help with her anxiety by playing with her dog, Ernie. Pt very interested in GI Group and CSW encouraged to attend. Pt is very close with her 88 yr old niece and concerns about helping her cope. CSW reviewed resources to assist such as [facility]. They are interested in attending the next Family Night at the [facility]. Transportation should not be an issue, but CSW provided resources to assist as needed. Pt and sister agree to reach out as needed.   ONCBCN DISTRESS SCREENING 09/26/2014  Screening Type Initial Screening  Distress experienced in past week (1-10) 7  Emotional problem type Nervousness/Anxiety  Physical Problem type Sleep/insomnia;Changes in urination  Physician notified of physical symptoms Yes  Referral to clinical social work Yes     Clinical Social Worker follow up needed: No.  If yes, follow up plan:  Loren Racer, [place]  [facility] Phone: [phone] Fax: [phone]

## 2014-09-27 LAB — CEA: CEA: 7.1 ng/mL — ABNORMAL HIGH (ref 0.0–5.0)

## 2014-09-28 ENCOUNTER — Encounter: Payer: Self-pay | Admitting: Hematology

## 2014-10-07 ENCOUNTER — Ambulatory Visit (HOSPITAL_COMMUNITY): Admission: RE | Admit: 2014-10-07 | Payer: Medicare Other | Source: Ambulatory Visit

## 2014-10-14 ENCOUNTER — Encounter (HOSPITAL_COMMUNITY)
Admission: RE | Admit: 2014-10-14 | Discharge: 2014-10-14 | Disposition: A | Discharge status: Home / Self Care | Payer: Medicare Other | Source: Ambulatory Visit | Attending: Hematology | Admitting: Hematology

## 2014-10-14 DIAGNOSIS — Z79899 Other long term (current) drug therapy: Secondary | ICD-10-CM | POA: Diagnosis not present

## 2014-10-14 DIAGNOSIS — C20 Malignant neoplasm of rectum: Secondary | ICD-10-CM

## 2014-10-14 LAB — GLUCOSE, CAPILLARY: Glucose-Capillary: 186 mg/dL — ABNORMAL HIGH (ref 70–99)

## 2014-10-14 IMAGING — PT NM PET TUM IMG INITIAL (PI) SKULL BASE T - THIGH
8 series · 25 of 25 positions shown · non-contrast
Comparison: [DATE] CT scans

CLINICAL DATA: Subsequent Treatment strategy for rectal cancer.
Rising CEA.

EXAM:
NUCLEAR MEDICINE PET SKULL BASE TO THIGH
TECHNIQUE: 9.5 mCi F-18 FDG was injected intravenously. Full-ring PET imaging
was performed from the skull base to thigh after the radiotracer. CT
data was obtained and used for attenuation correction and anatomic
localization.
FASTING BLOOD GLUCOSE:  Value: 186 Mg/dl

[Series 3: pet sk_thigh ac · axial · 5.0mm · 4.07mm/px · z∈[-1272,-364]mm · 4 of 228 slices shown]
[im 1/228]
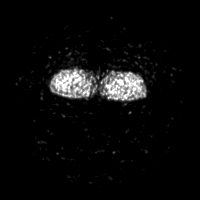
[im 76/228]
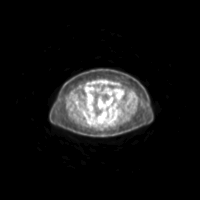
[im 152/228]
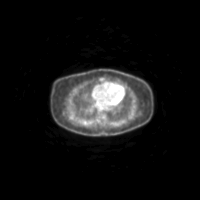
[im 228/228]
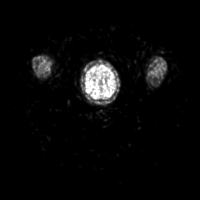

[Series 4: ct sk_thigh 5.0 b31f · axial · 5.0mm · 0.98mm/px · z∈[-1272,-364]mm · 5 of 228 slices shown]
[im 1/228]
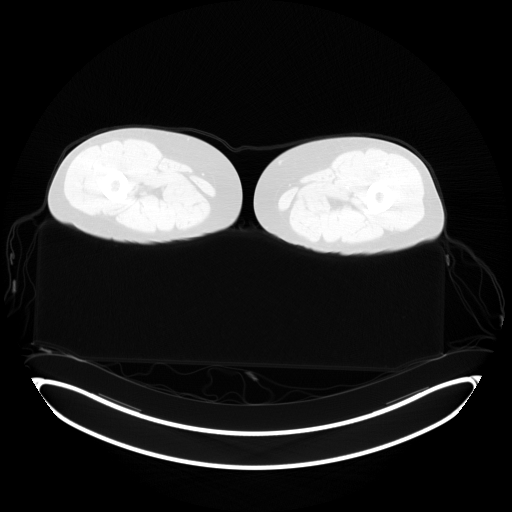
[im 57/228]
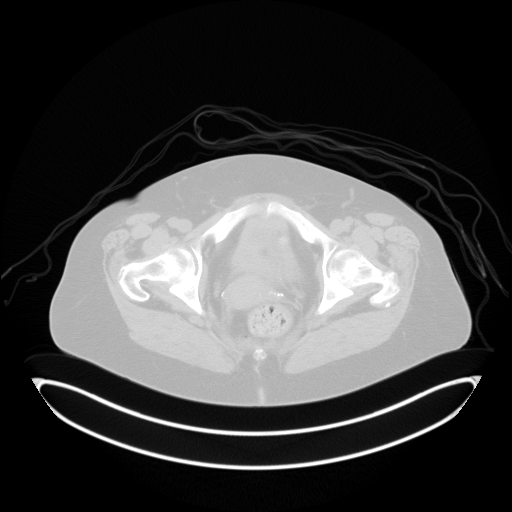
[im 114/228]
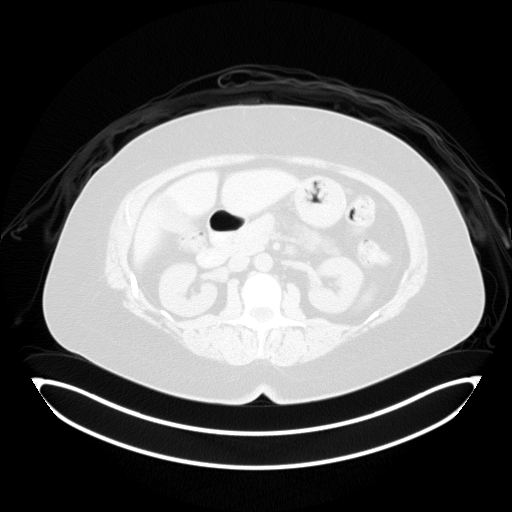
[im 171/228]
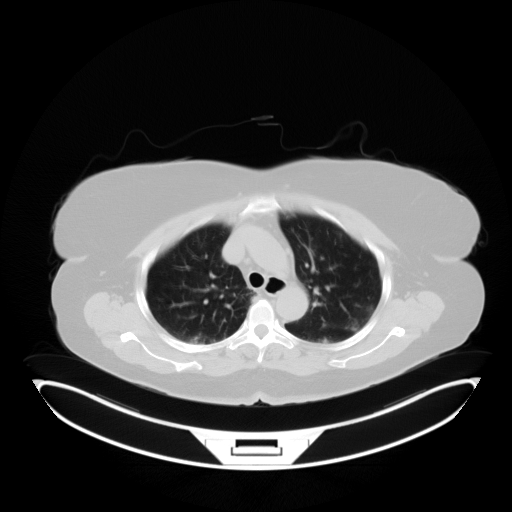
[im 228/228  brain]
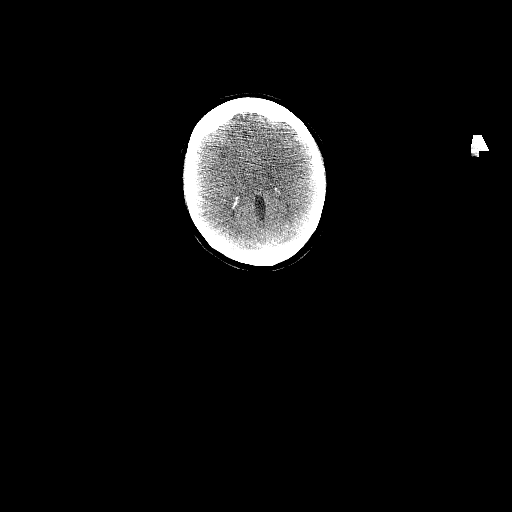

[Series 6: ct sk_thigh 5.0 b70f (id)_bone · axial · 5.0mm · 0.66mm/px · z∈[-768,-496]mm · 2 of 69 slices shown]
[im 1/69  bone]
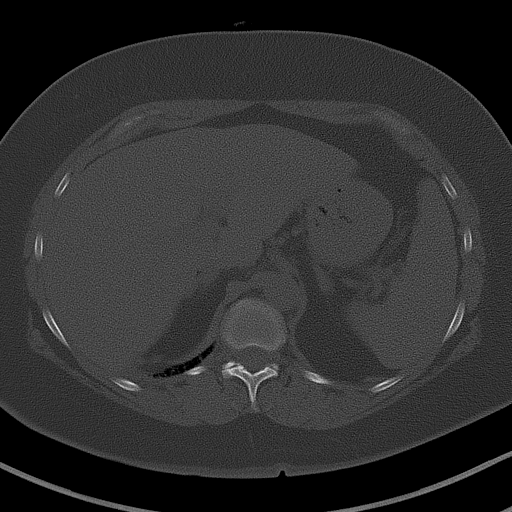
[im 69/69  bone]
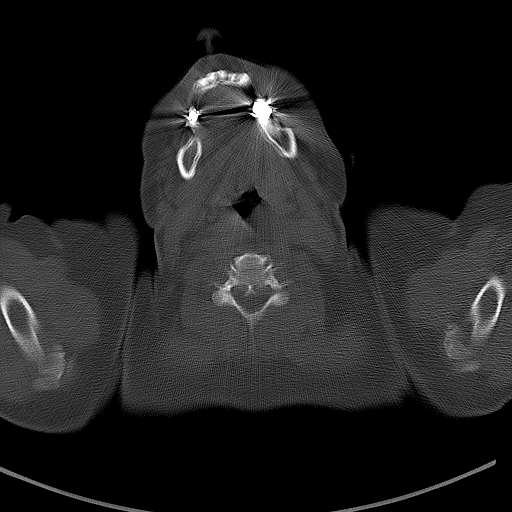

[Series 8: pet sk_thigh nac · axial · 5.0mm · 4.07mm/px · z∈[-1272,-364]mm · 5 of 228 slices shown]
[im 1/228]
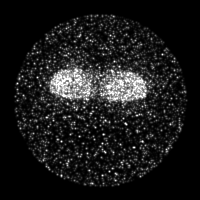
[im 57/228]
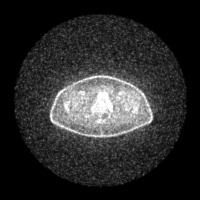
[im 114/228]
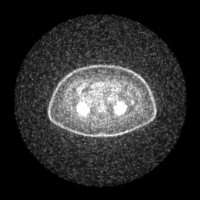
[im 171/228]
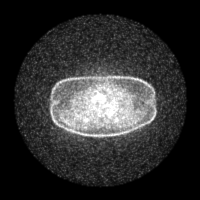
[im 228/228]
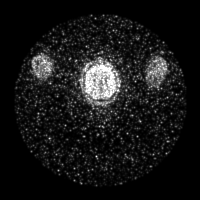

[Series 604: mip collection<mip range> · coronal · 1.88mm/px · 1 of 32 slices shown]
[im 1/32]
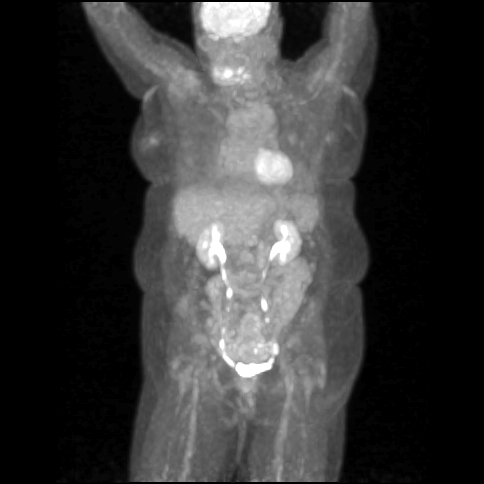

[Series 605: range-ct sk_thigh 5.0 (id)<alpha range> · 2 of 65 slices shown (1 of 2)]
[im 1/65]
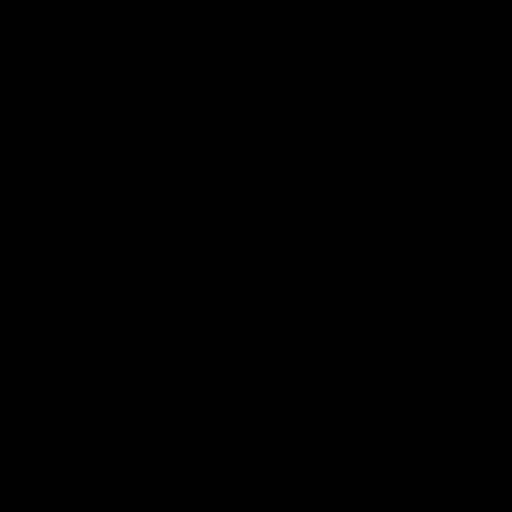
[im 65/65]
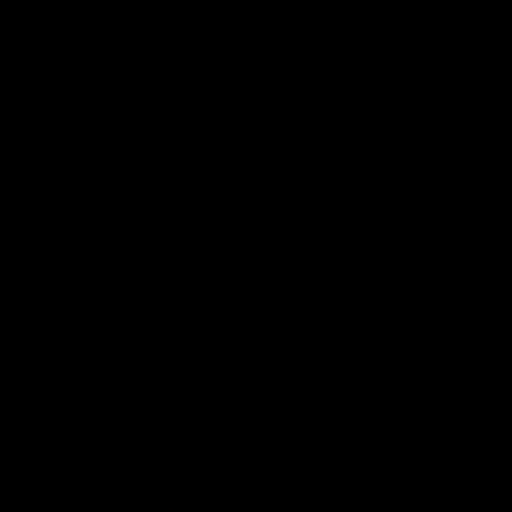

[Series 607: range-ct sk_thigh 5.0 (id)<alpha range> · 5 of 222 slices shown (2 of 2)]
[im 1/222]
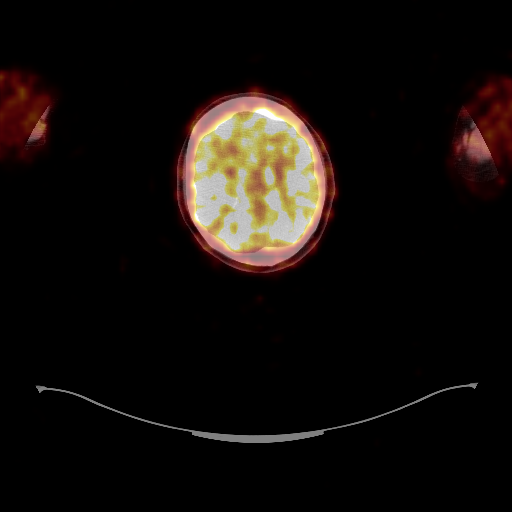
[im 56/222]
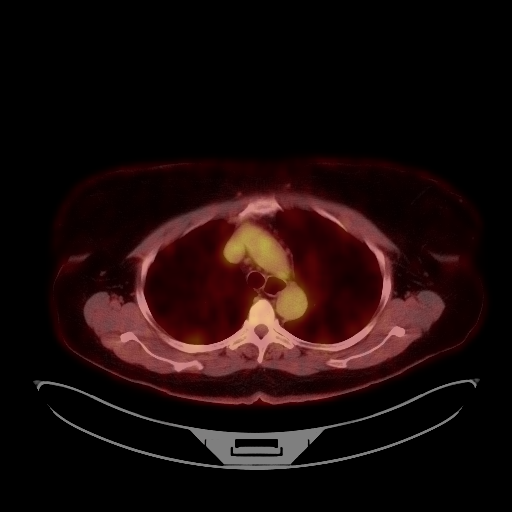
[im 111/222]
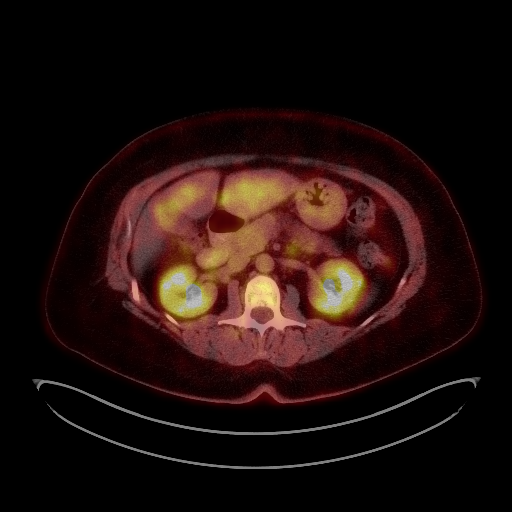
[im 166/222]
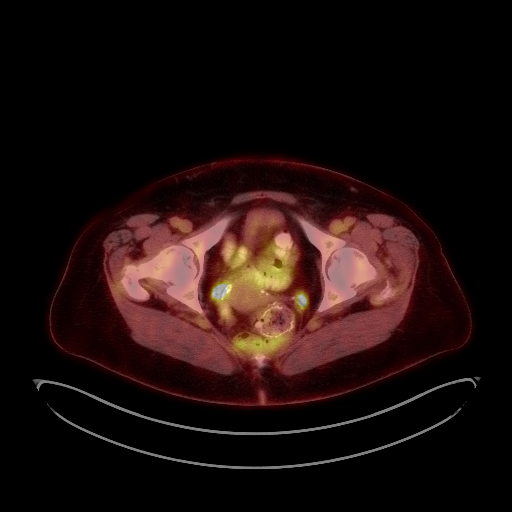
[im 222/222]
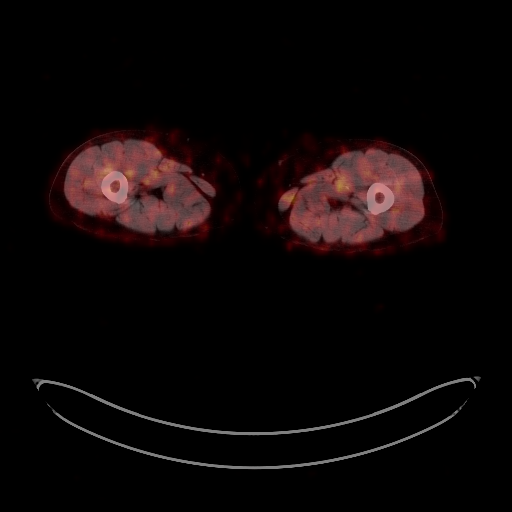

[Series 1035: results mm oncology reading · 0.89mm/px · 1 of 3 slices shown]
[im 1/3]
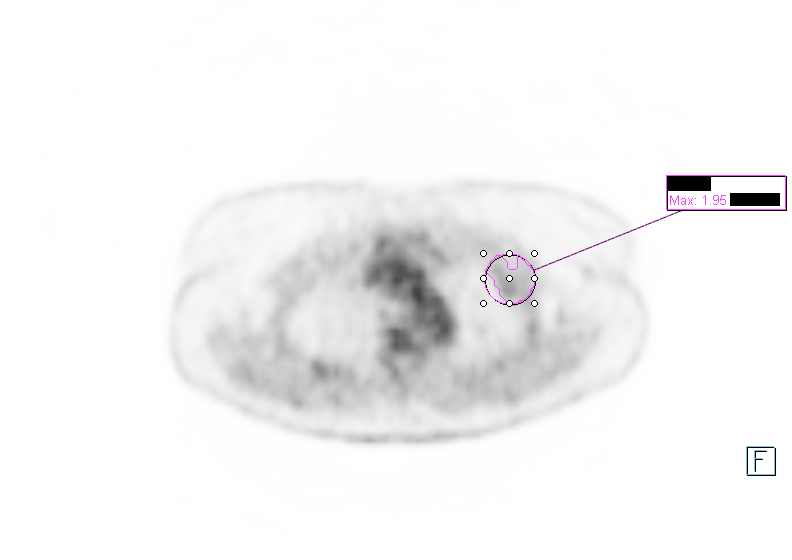

[25 of 25 positions shown; findings below may reference images not displayed]

FINDINGS: NECK

No hypermetabolic lymph nodes in the neck.

CHEST

Examination of the lungs is somewhat limited by breathing motion
artifact. There is peribronchial thickening and patchy ground-glass
opacity in both lungs. There are also numerous ill-defined pulmonary
nodules. A left upper lobe lesion on image number 21 measures a
maximum of 8.5 mm and is mildly hypermetabolic with SUV max of 1.95.

A lingular lesion on image number 40 measures 10 mm and is
hypermetabolic with SUV max of 4.17.

A vague nodular density in the left lower lobe peripherally measures
approximately 9 mm and is mildly hypermetabolic with SUV max of 2.5.

Nodules along the left major fissure were present on the prior study
but have enlarged slightly. No obvious hypermetabolism.

No enlarged or hypermetabolic mediastinal or hilar lymph nodes.
Small scattered nodes are noted.

ABDOMEN/PELVIS

No abnormal hypermetabolic activity within the liver, pancreas,
adrenal glands, or spleen. No hypermetabolic lymph nodes in the
abdomen or pelvis.

SKELETON

No focal hypermetabolic activity to suggest skeletal metastasis.
IMPRESSION: 1. New and enlarging vague pulmonary nodules bilaterally worrisome
for metastatic disease.
2. No metabolically active mediastinal or hilar mass or adenopathy.
3. No findings for abdominal/pelvic metastatic disease.

## 2014-10-14 MED ORDER — FLUDEOXYGLUCOSE F - 18 (FDG) INJECTION
9.5000 | Freq: Once | INTRAVENOUS | Status: AC | PRN
  Administered 2014-10-14: 9.5 via INTRAVENOUS

## 2014-10-17 ENCOUNTER — Other Ambulatory Visit: Payer: Self-pay | Admitting: *Deleted

## 2014-10-17 ENCOUNTER — Ambulatory Visit: Payer: Medicare Other | Admitting: Hematology

## 2014-10-20 ENCOUNTER — Telehealth: Payer: Self-pay | Admitting: Hematology

## 2014-10-20 NOTE — Telephone Encounter (Signed)
Unable to reach to confirm appointment. Mailed calendar

## 2014-10-28 ENCOUNTER — Ambulatory Visit (HOSPITAL_BASED_OUTPATIENT_CLINIC_OR_DEPARTMENT_OTHER): Payer: Medicare Other

## 2014-10-28 ENCOUNTER — Encounter: Payer: Self-pay | Admitting: Hematology

## 2014-10-28 ENCOUNTER — Ambulatory Visit (HOSPITAL_BASED_OUTPATIENT_CLINIC_OR_DEPARTMENT_OTHER): Payer: Medicare Other | Admitting: Hematology

## 2014-10-28 ENCOUNTER — Telehealth: Payer: Self-pay | Admitting: Hematology

## 2014-10-28 VITALS — BP 140/88 | HR 69 | Temp 97.8°F | Resp 18 | Ht 63.0 in | Wt 177.5 lb

## 2014-10-28 DIAGNOSIS — R970 Elevated carcinoembryonic antigen [CEA]: Secondary | ICD-10-CM

## 2014-10-28 DIAGNOSIS — C20 Malignant neoplasm of rectum: Secondary | ICD-10-CM

## 2014-10-28 DIAGNOSIS — Z720 Tobacco use: Secondary | ICD-10-CM

## 2014-10-28 DIAGNOSIS — Z85048 Personal history of other malignant neoplasm of rectum, rectosigmoid junction, and anus: Secondary | ICD-10-CM

## 2014-10-28 LAB — COMPREHENSIVE METABOLIC PANEL (CC13)
ALT: 10 U/L (ref 0–55)
AST: 16 U/L (ref 5–34)
Albumin: 3.4 g/dL — ABNORMAL LOW (ref 3.5–5.0)
Alkaline Phosphatase: 106 U/L (ref 40–150)
Anion Gap: 10 mEq/L (ref 3–11)
BUN: 22.5 mg/dL (ref 7.0–26.0)
CO2: 23 mEq/L (ref 22–29)
Calcium: 9 mg/dL (ref 8.4–10.4)
Chloride: 107 mEq/L (ref 98–109)
Creatinine: 1.2 mg/dL — ABNORMAL HIGH (ref 0.6–1.1)
EGFR: 55 mL/min/{1.73_m2} — ABNORMAL LOW (ref >90)
Glucose: 177 mg/dl — ABNORMAL HIGH (ref 70–140)
Potassium: 4.2 mEq/L (ref 3.5–5.1)
Sodium: 140 mEq/L (ref 136–145)
Total Bilirubin: 1.01 mg/dL (ref 0.20–1.20)
Total Protein: 7.8 g/dL (ref 6.4–8.3)

## 2014-10-28 LAB — CBC WITH DIFFERENTIAL/PLATELET
BASO%: 1.2 % (ref 0.0–2.0)
Basophils Absolute: 0.1 10*3/uL (ref 0.0–0.1)
EOS%: 1.8 % (ref 0.0–7.0)
Eosinophils Absolute: 0.1 10*3/uL (ref 0.0–0.5)
HCT: 34.4 % — ABNORMAL LOW (ref 34.8–46.6)
HGB: 11.9 g/dL (ref 11.6–15.9)
LYMPH%: 24.6 % (ref 14.0–49.7)
MCH: 33.1 pg (ref 25.1–34.0)
MCHC: 34.7 g/dL (ref 31.5–36.0)
MCV: 95.6 fL (ref 79.5–101.0)
MONO#: 0.3 10*3/uL (ref 0.1–0.9)
MONO%: 5.7 % (ref 0.0–14.0)
NEUT#: 3.1 10*3/uL (ref 1.5–6.5)
NEUT%: 66.7 % (ref 38.4–76.8)
Platelets: 198 10*3/uL (ref 145–400)
RBC: 3.6 10*6/uL — ABNORMAL LOW (ref 3.70–5.45)
RDW: 15 % — ABNORMAL HIGH (ref 11.2–14.5)
WBC: 4.7 10*3/uL (ref 3.9–10.3)
lymph#: 1.2 10*3/uL (ref 0.9–3.3)

## 2014-10-28 NOTE — Progress Notes (Signed)
[place]  [phone] Fax:([phone]) [phone]  Clinic New Consult Note   Patient Care Team: Nolene Ebbs, MD as PCP - General (Internal Medicine) 10/28/2014  CHIEF COMPLAINTS/PURPOSE OF CONSULTATION:  History of rectal cancer, arising CEA    Oncology History   Rectal cancer   Staging form: Colon and Rectum, AJCC 7th Edition     Pathologic: Stage I (T1, N0, cM0) - Signed by Truitt Merle, MD on 09/26/2014       Rectal cancer   09/13/2012 Procedure Colonoscopy & endorectal ultrasound-Dr. Outlaw=Distal colonic polyp   11/14/2012 Procedure Sigmoidoscopy & Laparoscopy=Dr. Donne Hazel   01/29/2013 Pathologic Stage pT1N0cM0, stage I, well-differentiated adenocarcinoma arising in background of tubular villous adenoma, invading into the submucosa. No evidence of angiolymphatic invasion. 18 lymph nodes were negative.   01/29/2013 Surgery Rectosigmoid segmental resection by Dr. Marcello Moores. Surgical margin was negative.   02/04/2013 Initial Diagnosis Rectal cancer   03/11/2014 Imaging CT C/A/P=No acute abdominal/pelvic findings, mass lesions or adenopathy. No findings to suggest recurrent rectal cancer and no adenopathy or metastatic disease. 2. Emphysematous changes. 3. Suspect early interstitial lung disease. 4. Tortuous and mil   09/12/2014 Tumor Marker CEA=6.4    HISTORY OF PRESENTING ILLNESS:  Erica [name] 59 y.o. female is here because of her history of stage I rectal cancer and recent abnormal CEA. She presents with her sister to the clinic today.   Her rectal cancer was diagnosed in August 2014 by screening colonoscopy. She was found to have multiple colon polyps in April 2014, which were removed by colonoscopy and pathology showedtubular or tubulovillous adenoma, and a rectal polyp biopsy showed high-grade dysplasia. She underwent LAR on 01/29/2013, surgical pathology showed invasive well-differentiated adenocarcinoma, pT1N0, 8 lymph nodes were negative. She has been followed by  Dr. Marcello Moores since then. Her last CTs scan was in September 2015 which was negative for disease recurrence, and colonoscopy on 02/04/2014 showed no evidence of disease recurrence, except 3 colon polyps which were removed. Her CEA however on 09/12/2014 was elevated at 6.4. She was referred by Dr. Marcello Moores for further evaluation.  She has some residual low abdominal pain since the surgery, she has loose BM 2-4 times daily, no blood, appetite and energy level is good, no significant change lately, except low back pain for the past 6 month, which is 6/10, no radiation pain. No recently weight loss.   INTERIM HISTORY: Arraya returns for follow-up. She has been doing well, only complaint is intermittent pain in the left low back, no radiation pain, no leg weakness or numbness. She had similar pain before. She has mild intermittent chronic dry cough from her COPD and smoking. She has cut down her cigarette smoking, but has not quit yet. She otherwise feels well, no abdominal discomfort, no nausea, bowel movement is normal. She is going to follow-up with Dr. Marcello Moores in a few weeks.  MEDICAL HISTORY:  Past Medical History  Diagnosis Date  . Diabetes mellitus   . Hypertension   . Mental disorder     depression  . Depression   . Asthma   . GERD (gastroesophageal reflux disease)   . Headache(784.0)     "mild"  . Neuropathy     feet   . Arthritis     especially in shoulders  . Rectal polyp     very little bleeding with bowel movements- no pain  . Pain     arthritis pain - takes tramadol as needed    SURGICAL HISTORY: Past Surgical  History  Procedure Laterality Date  . Tonsillectomy and adenoidectomy    . Cesarean section    . Tonsillectomy    . Laparoscopic sigmoid colectomy N/A 11/14/2012    Procedure: DIAGNOSTIC LAPAROSCOPY AND SIGMOIDMOIDOSCOPY ;  Surgeon: Rolm Bookbinder, MD;  Location: [facility];  Service: General;  Laterality: N/A;  . Eus N/A 11/21/2012    Procedure: LOWER ENDOSCOPIC ULTRASOUND  (EUS);  Surgeon: Arta Silence, MD;  Location: [facility];  Service: Endoscopy;  Laterality: N/A;  . Flexible sigmoidoscopy N/A 11/21/2012    Procedure: FLEXIBLE SIGMOIDOSCOPY;  Surgeon: Arta Silence, MD;  Location: [facility];  Service: Endoscopy;  Laterality: N/A;  . Flexible sigmoidoscopy N/A 01/28/2013    Procedure: FLEXIBLE SIGMOIDOSCOPY;  Surgeon: Leighton Ruff, MD;  Location: [facility];  Service: Endoscopy;  Laterality: N/A;  . Laparoscopic low anterior resection N/A 01/29/2013    Procedure: LAPAROSCOPIC LOW ANTERIOR RESECTION, Rigid Proctoscopy;  Surgeon: Leighton Ruff, MD;  Location: [facility];  Service: General;  Laterality: N/A;    SOCIAL HISTORY: History   Social History  . Marital Status: Single    Spouse Name: N/A  . Number of Children: One son, 106   . Years of Education: N/A   Occupational History  . Disabled due to depression, DM    Social History Main Topics  . Smoking status: Current Every Day Smoker -- 0.25 packs/day for 20 years    Types: Cigarettes  . Smokeless tobacco: Never Used     Comment: one every 2-3 days  . Alcohol Use: 0.0 oz/week     Comment: 3 cans beer a week      pt states crack cocaine was 5 or 6 yrs ago   . Drug Use: Yes    Special: "Crack" cocaine     Comment: last time was a year ago   . Sexual Activity: No   Other Topics Concern  . Not on file   Social History Narrative    FAMILY HISTORY: Family History  Problem Relation Age of Onset  . Hypertension Father   . Stroke Father   . Diabetes Father   . Heart disease Mother   . Hypertension Mother   . Hyperlipidemia Mother   . Hypertension Sister   . Hypertension Brother   . Hypertension Sister   . Hypertension Brother   . Cancer Maternal Aunt     cancer, unknown type   . Cancer Maternal Uncle     bone cancer   . Cancer Paternal Aunt     lung cancer  . Cancer Paternal Uncle     lung cancer  . Cancer Maternal Uncle     colon cancer and prostate cancer   . Cancer  Maternal Uncle     prostate cancer   . Cancer Other 47    colon cancer     ALLERGIES:  is allergic to penicillins.  MEDICATIONS:  Current Outpatient Prescriptions  Medication Sig Dispense Refill  . albuterol (PROVENTIL HFA;VENTOLIN HFA) 108 (90 BASE) MCG/ACT inhaler Inhale 2 puffs into the lungs every 4 (four) hours as needed for wheezing.    Marland Kitchen amLODipine (NORVASC) 10 MG tablet Take 10 mg by mouth every morning.     . citalopram (CELEXA) 20 MG tablet Take 20 mg by mouth every morning.     . gabapentin (NEURONTIN) 300 MG capsule Take 300 mg by mouth 2 (two) times daily.     . hydrochlorothiazide (MICROZIDE) 12.5 MG capsule Take 12.5 mg by mouth every morning.    Marland Kitchen  ibuprofen (ADVIL,MOTRIN) 800 MG tablet Take 1 tablet by mouth as needed.    . metFORMIN (GLUCOPHAGE) 1000 MG tablet Take 500 mg by mouth 2 (two) times daily with a meal.     . pantoprazole (PROTONIX) 40 MG tablet Take 40 mg by mouth daily.    . pravastatin (PRAVACHOL) 10 MG tablet Take 10 mg by mouth daily.     . traMADol (ULTRAM) 50 MG tablet Take 50 mg by mouth every 6 (six) hours as needed for pain.      No current facility-administered medications for this visit.    REVIEW OF SYSTEMS:   Constitutional: Denies fevers, chills or abnormal night sweats Eyes: Denies blurriness of vision, double vision or watery eyes Ears, nose, mouth, throat, and face: Denies mucositis or sore throat Respiratory: mild intermittent dry cough, no dyspnea or wheezes Cardiovascular: Denies palpitation, chest discomfort or lower extremity swelling Gastrointestinal:  Denies nausea, heartburn or change in bowel habits Skin: Denies abnormal skin rashes Lymphatics: Denies new lymphadenopathy or easy bruising Neurological:Denies numbness, tingling or new weaknesses Behavioral/Psych: Mood is stable, no new changes  All other systems were reviewed with the patient and are negative.  PHYSICAL EXAMINATION: ECOG PERFORMANCE STATUS: 1 - Symptomatic but  completely ambulatory  Filed Vitals:   10/28/14 1318  BP: 140/88  Pulse: 69  Temp: 97.8 F (36.6 C)  Resp: 18   Filed Weights   10/28/14 1318  Weight: 177 lb 8 oz (80.513 kg)    GENERAL:alert, no distress and comfortable SKIN: skin color, texture, turgor are normal, no rashes or significant lesions EYES: normal, conjunctiva are pink and non-injected, sclera clear OROPHARYNX:no exudate, no erythema and lips, buccal mucosa, and tongue normal  NECK: supple, thyroid normal size, non-tender, without nodularity LYMPH:  no palpable lymphadenopathy in the cervical, axillary or inguinal LUNGS: clear to auscultation and percussion with normal breathing effort HEART: regular rate & rhythm and no murmurs and no lower extremity edema ABDOMEN:abdomen soft, mild tenderness at the low pelvic area, bo rebound pain, and normal bowel sounds Musculoskeletal:no cyanosis of digits and no clubbing. (+) tenderness at the left upper back, posterior iliac crest area  PSYCH: alert & oriented x 3 with fluent speech NEURO: no focal motor/sensory deficits  LABORATORY DATA:  I have reviewed the data as listed Lab Results  Component Value Date   WBC 4.3 09/26/2014   HGB 12.5 09/26/2014   HCT 34.5* 09/26/2014   MCV 93.2 09/26/2014   PLT 202 09/26/2014    Recent Labs  04/18/14 1500 09/26/14 1012  NA 141 139  K 4.5 4.5  CL 104  --   CO2  --  22  GLUCOSE 179* 193*  BUN 14 21.5  CREATININE 1.10 1.3*  CALCIUM  --  7.3*  PROT  --  8.0  ALBUMIN  --  3.4*  AST  --  16  ALT  --  11  ALKPHOS  --  100  BILITOT  --  1.26*   PATHOLOGY REPORT:  Diagnosis 02/04/2014 Surgical [P], ascending, transverse, descending, polyp - FRAGMENTS OF TUBULAR ADENOMAS (X3); NEGATIVE FOR HIGH GRADE DYSPLASIA OR MALIGNANCY.  Diagnosis 01/29/2013 1. Colon, segmental resection, rectosigmoid - INVASIVE WELL DIFFERENTIATED ADENOCARCINOMA ARISING IN BACKGROUND OF TUBULOVILLOUS ADENOMA, INVADING INTO THE SUBMUCOSA. - NO  EVIDENCE OF ANGIOLYMPHATIC INVASION IDENTIFIED. - EIGHT LYMPH NODES, NEGATIVE FOR METASTATIC CARCINOMA (0/8 ), PLEASE SEE COMMENT - MULTIPLE SERRATED POLYPS/ADENOMA. - RESECTION MARGINS, NEGATIVE FOR ATYPIA OR MALIGNANCY. 2. Colon, resection margin (donut), anastomotic rings -  BENIGN COLONIC MUCOSA, NO EVIDENCE OF MALIGNANCY. Microscopic Comment 1. COLON AND RECTUM Specimen: Rectosigmoid colon Procedure: Segmental resection Tumor site: Upper rectum Specimen integrity: Intact Macroscopic intactness of mesorectum: Near complete Macroscopic tumor perforation: No Invasive tumor: Maximum size: 1 cm Histologic type(s): Invasive adenocarcinoma Histologic grade and differentiation: G1: well differentiated/low grade Type of polyp in which invasive carcinoma arose: Tubulovillous adenoma Microscopic extension of invasive tumor: Invading into submucosa Lymph-Vascular invasion: Not identified Peri-neural invasion: Not identified Tumor deposit(s) (discontinuous extramural extension): No Resection margins: Proximal: 11.2 cm Distal: 2.5 cm Radial: 1.6 cm Treatment effect (neoadjuvant therapy): Not identified Number of lymph nodes examined 6+2=8 ; Number positive 0 Additional polyp(s): Multiple serrated polyps. Non-neoplastic findings: Not identified Pathologic Staging: T1, N0, Mx Ancillary studies: The tumor block was sent to [facility] for MSI testing. Comment: The pericolonic fatty tissue was submitted for clearing process. Six lymph nodes were identified and sections show no evidence of metastatic carcinoma. Per Dr. Leighton Ruff' request, ten additional tissue blocks of pericolonic fatty tissue were submitted for microscopic examination. Two tiny small lymph nodes were identified microscopically and show no evidence of metastatic carcinoma. Case was discussed with Dr. Marcello Moores on 02-04-2013. RADIOGRAPHIC STUDIES: I have personally reviewed the radiological images as listed and agreed with the  findings in the report.  Nm Pet Image Initial (pi) Skull Base To Thigh  10/14/2014   CLINICAL DATA:  Subsequent Treatment strategy for rectal cancer. Rising CEA.  EXAM: NUCLEAR MEDICINE PET SKULL BASE TO THIGH  TECHNIQUE: 9.5 mCi F-18 FDG was injected intravenously. Full-ring PET imaging was performed from the skull base to thigh after the radiotracer. CT data was obtained and used for attenuation correction and anatomic localization.  FASTING BLOOD GLUCOSE:  Value: 186 Mg/dl  COMPARISON:  03/11/2014 CT scans  FINDINGS: NECK  No hypermetabolic lymph nodes in the neck.  CHEST  Examination of the lungs is somewhat limited by breathing motion artifact. There is peribronchial thickening and patchy ground-glass opacity in both lungs. There are also numerous ill-defined pulmonary nodules. A left upper lobe lesion on image number 21 measures a maximum of 8.5 mm and is mildly hypermetabolic with SUV max of 4.94.  A lingular lesion on image number 40 measures 10 mm and is hypermetabolic with SUV max of 4.96.  A vague nodular density in the left lower lobe peripherally measures approximately 9 mm and is mildly hypermetabolic with SUV max of 2.5.  Nodules along the left major fissure were present on the prior study but have enlarged slightly. No obvious hypermetabolism.  No enlarged or hypermetabolic mediastinal or hilar lymph nodes. Small scattered nodes are noted.  ABDOMEN/PELVIS  No abnormal hypermetabolic activity within the liver, pancreas, adrenal glands, or spleen. No hypermetabolic lymph nodes in the abdomen or pelvis.  SKELETON  No focal hypermetabolic activity to suggest skeletal metastasis.  IMPRESSION: 1. New and enlarging vague pulmonary nodules bilaterally worrisome for metastatic disease. 2. No metabolically active mediastinal or hilar mass or adenopathy. 3. No findings for abdominal/pelvic metastatic disease.   Electronically Signed   By: Marijo Sanes M.D.   On: 10/14/2014 15:35    ASSESSMENT & PLAN:    59 year old female, with past medical history of stage I rectal adenocarcinoma in August 2014, status post complete surgical resection. Now presents with a rising CEA.  1. History of rectal adenocarcinoma, stage I, arising CEA, indeterminate lung nodules -she is now 9 months out of her colon cancer surgery. -she has slightly elevated CEA 7.1 on 09/26/2014, previously  was 5 about 11 month ago (preop). -I reviewed her PET scan results, which showed multiple faint small lung nodules, new since her last CT scan in September 2015. I reviewed the scan with radiologist, who feels those nodules are difficult to biopsy.  PET scan otherwise was negative for definitive metastatic disease. -I recommend a close follow-up for her lung nodules, we'll repeat CT chest with IV contrast in 3 month -Repeat CEA and her lab today   2. HTN, DM, depression -Follow up with primary care physician  Follow-up: I'll see her back in 3 months with repeated CT chest and lab.  All questions were answered. The patient knows to call the clinic with any problems, questions or concerns. I spent 25 minutes counseling the patient face to face. The total time spent in the appointment was 30 minutes and more than 50% was on counseling.     Truitt Merle, MD 10/28/2014 1:41 PM

## 2014-10-28 NOTE — Telephone Encounter (Signed)
Pt confirmed labs/ov per 05/17 POF, gave pt AVS and Calendar.... KJ

## 2014-10-29 LAB — CEA: CEA: 7 ng/mL — ABNORMAL HIGH (ref 0.0–5.0)

## 2014-11-04 ENCOUNTER — Telehealth: Payer: Self-pay | Admitting: Hematology

## 2014-11-04 NOTE — Telephone Encounter (Signed)
Pt came in to find out about order for CT Chest w/IV contrast, still no orders but shows on 05/17 POF, I s/w Dr. Burr Medico on the 17th and she was going to put the order in, s/w Dr. Ernestina Penna desk nurse 05/24 she is going to get with Dr. Burr Medico concerning the order for this week and the next one before next visit..... KJ

## 2014-11-05 ENCOUNTER — Other Ambulatory Visit: Payer: Self-pay | Admitting: *Deleted

## 2014-11-05 ENCOUNTER — Other Ambulatory Visit: Payer: Self-pay | Admitting: Hematology

## 2014-11-05 DIAGNOSIS — C20 Malignant neoplasm of rectum: Secondary | ICD-10-CM

## 2015-01-07 ENCOUNTER — Other Ambulatory Visit: Payer: Self-pay | Admitting: *Deleted

## 2015-01-07 ENCOUNTER — Telehealth: Payer: Self-pay

## 2015-01-07 NOTE — Telephone Encounter (Signed)
Pt called requesting time of her visit on 8/2. She also mentioned Dr Burr Medico wanted CT before her next visit. Per radiology the CT order stated "expected date of 8/3" so was not scheduled yet Called pt back and left voice message and told her there were 4 slots available on 8/1 for CT and to call radiology scheduling ASAP, gave phone number. Also mentioned this will be with contrast and she needs to come by to pick that up. Clarified the pt's date and time with Dr Burr Medico.

## 2015-01-09 ENCOUNTER — Telehealth: Payer: Self-pay | Admitting: Hematology

## 2015-01-09 ENCOUNTER — Other Ambulatory Visit: Payer: Self-pay | Admitting: *Deleted

## 2015-01-09 DIAGNOSIS — C20 Malignant neoplasm of rectum: Secondary | ICD-10-CM

## 2015-01-09 NOTE — Telephone Encounter (Signed)
Left message to confirm lab 08/01

## 2015-01-12 ENCOUNTER — Other Ambulatory Visit (HOSPITAL_BASED_OUTPATIENT_CLINIC_OR_DEPARTMENT_OTHER): Payer: Medicare Other

## 2015-01-12 ENCOUNTER — Ambulatory Visit (HOSPITAL_COMMUNITY)
Admission: RE | Admit: 2015-01-12 | Discharge: 2015-01-12 | Disposition: A | Discharge status: Home / Self Care | Payer: Medicare Other | Source: Ambulatory Visit | Attending: Hematology | Admitting: Hematology

## 2015-01-12 DIAGNOSIS — J849 Interstitial pulmonary disease, unspecified: Secondary | ICD-10-CM | POA: Diagnosis not present

## 2015-01-12 DIAGNOSIS — R970 Elevated carcinoembryonic antigen [CEA]: Secondary | ICD-10-CM | POA: Diagnosis present

## 2015-01-12 DIAGNOSIS — R918 Other nonspecific abnormal finding of lung field: Secondary | ICD-10-CM | POA: Diagnosis present

## 2015-01-12 DIAGNOSIS — C20 Malignant neoplasm of rectum: Secondary | ICD-10-CM

## 2015-01-12 LAB — CBC WITH DIFFERENTIAL/PLATELET
BASO%: 0.5 % (ref 0.0–2.0)
Basophils Absolute: 0 10*3/uL (ref 0.0–0.1)
EOS%: 2.1 % (ref 0.0–7.0)
Eosinophils Absolute: 0.1 10*3/uL (ref 0.0–0.5)
HCT: 34.1 % — ABNORMAL LOW (ref 34.8–46.6)
HGB: 12.1 g/dL (ref 11.6–15.9)
LYMPH%: 27.7 % (ref 14.0–49.7)
MCH: 34 pg (ref 25.1–34.0)
MCHC: 35.5 g/dL (ref 31.5–36.0)
MCV: 95.6 fL (ref 79.5–101.0)
MONO#: 0.2 10*3/uL (ref 0.1–0.9)
MONO%: 6.2 % (ref 0.0–14.0)
NEUT#: 2.3 10*3/uL (ref 1.5–6.5)
NEUT%: 63.5 % (ref 38.4–76.8)
Platelets: 198 10*3/uL (ref 145–400)
RBC: 3.57 10*6/uL — ABNORMAL LOW (ref 3.70–5.45)
RDW: 14.5 % (ref 11.2–14.5)
WBC: 3.6 10*3/uL — ABNORMAL LOW (ref 3.9–10.3)
lymph#: 1 10*3/uL (ref 0.9–3.3)

## 2015-01-12 LAB — COMPREHENSIVE METABOLIC PANEL (CC13)
ALT: 13 U/L (ref 0–55)
AST: 15 U/L (ref 5–34)
Albumin: 3.4 g/dL — ABNORMAL LOW (ref 3.5–5.0)
Alkaline Phosphatase: 118 U/L (ref 40–150)
Anion Gap: 6 mEq/L (ref 3–11)
BUN: 20.7 mg/dL (ref 7.0–26.0)
CO2: 23 mEq/L (ref 22–29)
Calcium: 9 mg/dL (ref 8.4–10.4)
Chloride: 106 mEq/L (ref 98–109)
Creatinine: 1.5 mg/dL — ABNORMAL HIGH (ref 0.6–1.1)
EGFR: 42 mL/min/{1.73_m2} — ABNORMAL LOW (ref >90)
Glucose: 395 mg/dl — ABNORMAL HIGH (ref 70–140)
Potassium: 3.8 mEq/L (ref 3.5–5.1)
Sodium: 135 mEq/L — ABNORMAL LOW (ref 136–145)
Total Bilirubin: 1.41 mg/dL — ABNORMAL HIGH (ref 0.20–1.20)
Total Protein: 7.8 g/dL (ref 6.4–8.3)

## 2015-01-12 IMAGING — CT CT CHEST W/ CM
2 of 4 series · 15 of 36 positions shown, 18 images · IV contrast (OMNIPAQUE)
Comparison: PET-CT on [DATE] and chest CT on [DATE]

CLINICAL DATA: Followup indeterminate pulmonary nodules. Rectal
carcinoma.

EXAM:
CT CHEST WITH CONTRAST
TECHNIQUE: Multidetector CT imaging of the chest was performed during
intravenous contrast administration.
CONTRAST:  60mL OMNIPAQUE IOHEXOL 300 MG/ML  SOLN

[Series 2: chest with st · axial · 0.73mm/px · z∈[-334,-54]mm · 12 of 66 slices shown, 15 images]
[im 5/66  mediastinal]
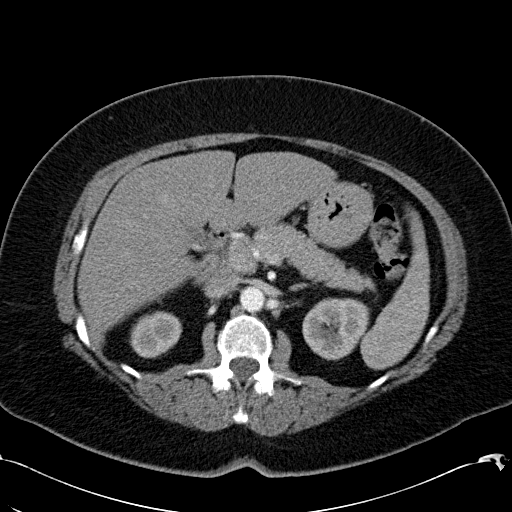
[im 5/66  lung]
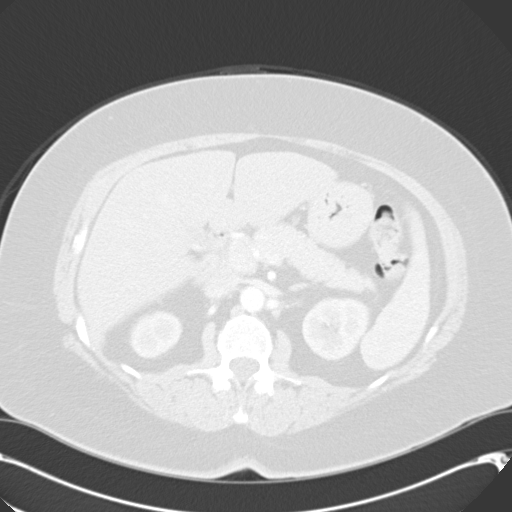
[im 10/66  lung]
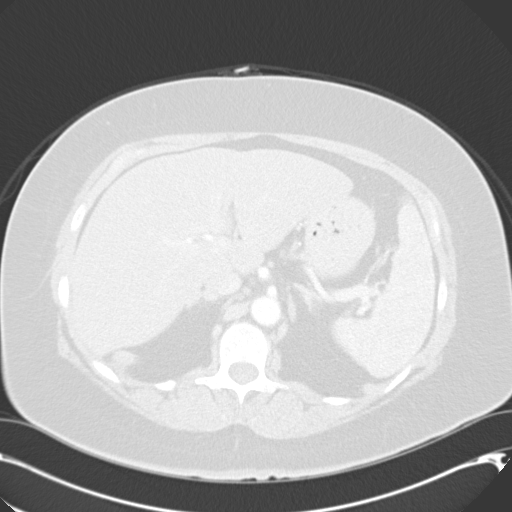
[im 14/66  lung]
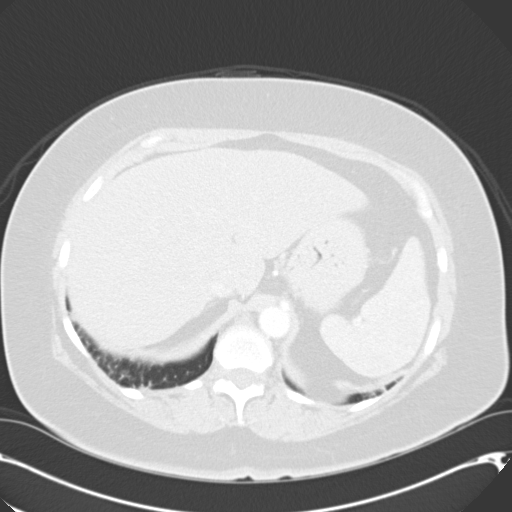
[im 19/66  lung]
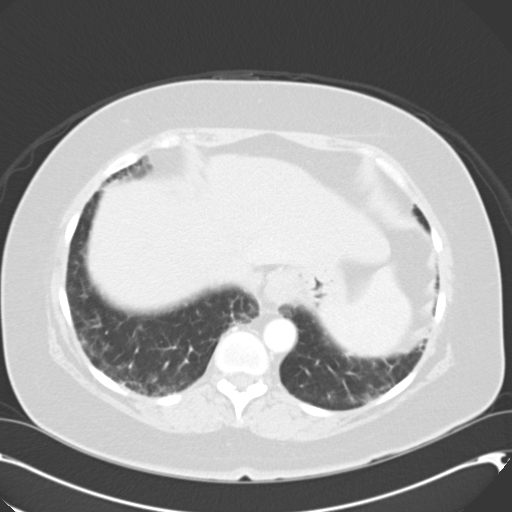
[im 24/66  mediastinal]
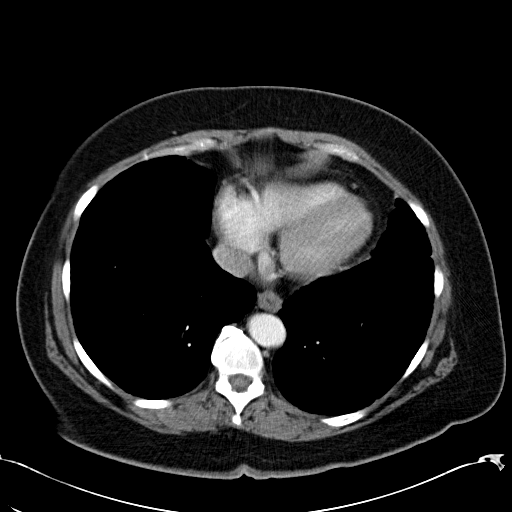
[im 24/66  lung]
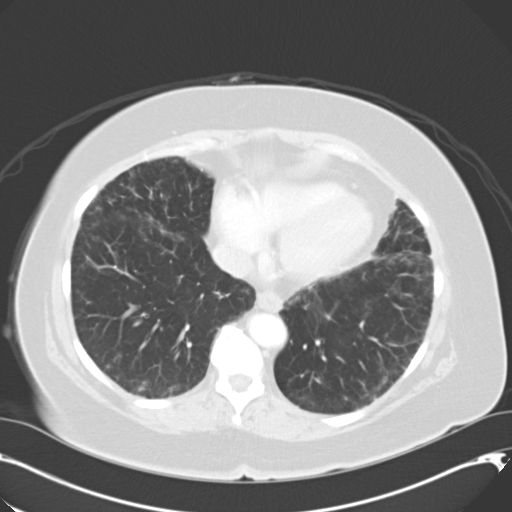
[im 28/66  lung]
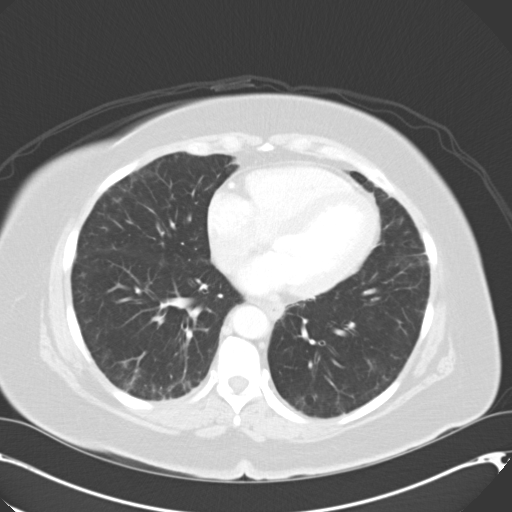
[im 38/66  lung]
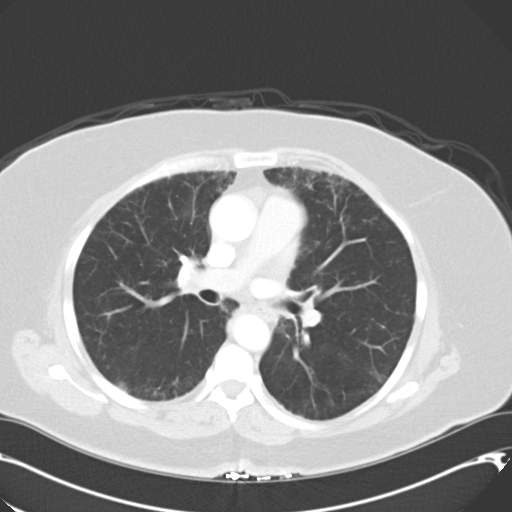
[im 42/66  lung]
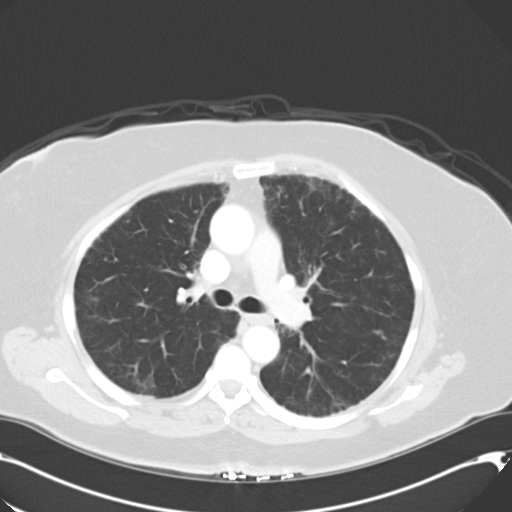
[im 47/66  mediastinal]
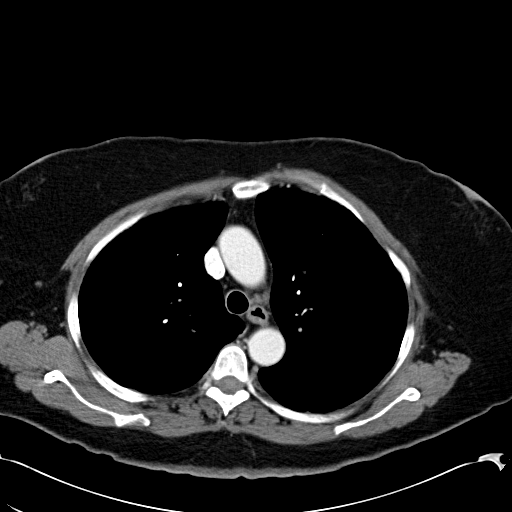
[im 47/66  lung]
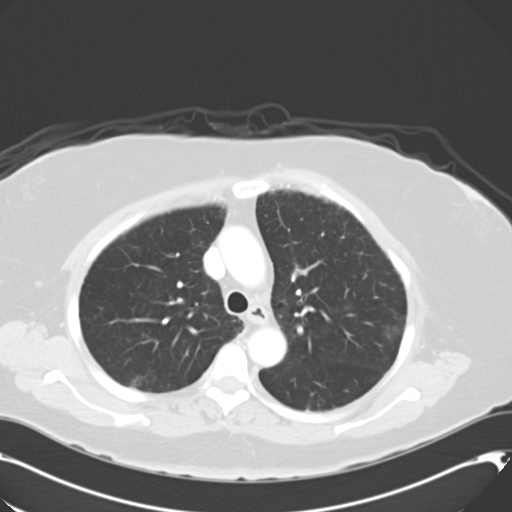
[im 52/66  lung]
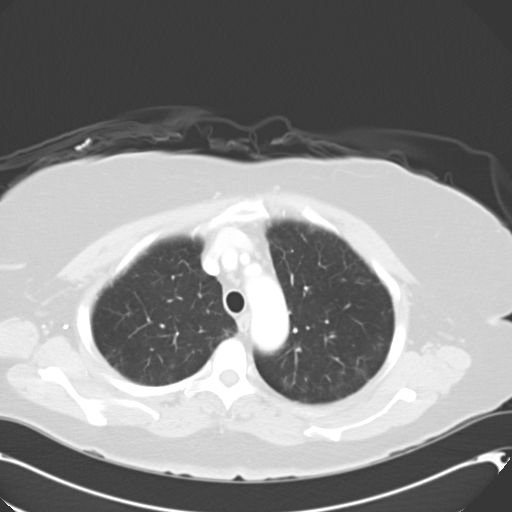
[im 56/66  lung]
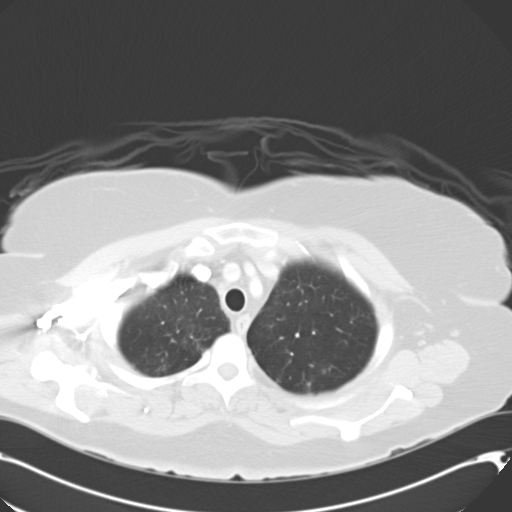
[im 61/66  lung]
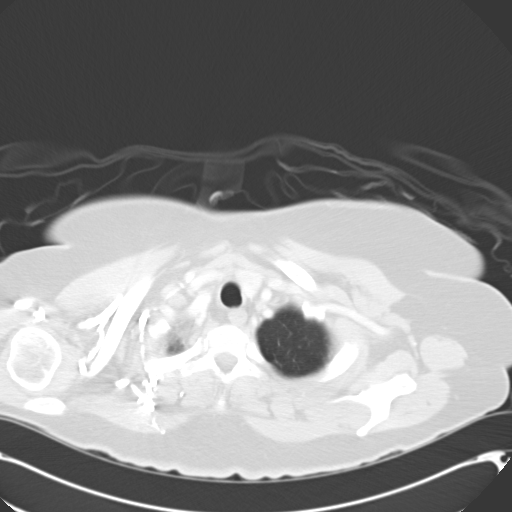

[Series 602: cor · coronal · 0.73mm/px · 3 of 79 slices shown]
[im 16/79  lung]
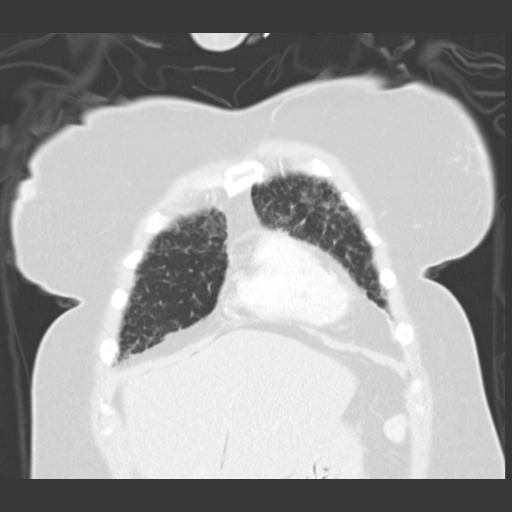
[im 32/79  lung]
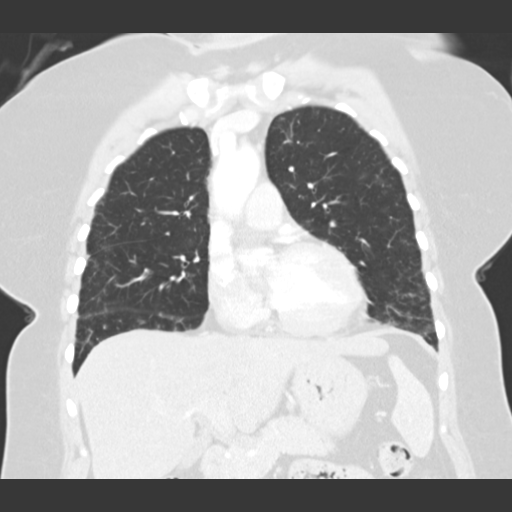
[im 47/79  lung]
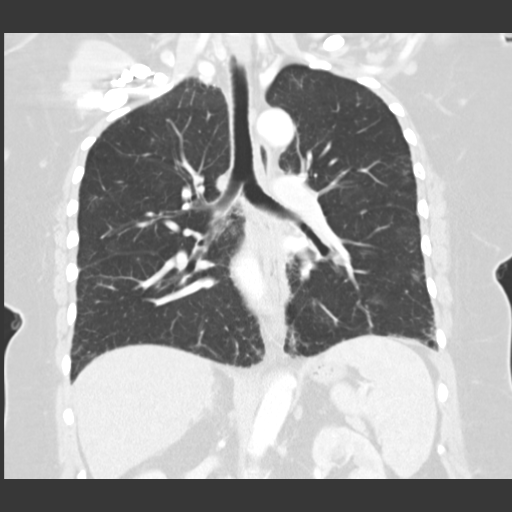

[15 of 36 positions shown; findings below may reference images not displayed]

FINDINGS: Mediastinum/Lymph Nodes: No masses or pathologically enlarged lymph
nodes identified. Small less than 1 cm mediastinal lymph nodes
remain stable.

Lungs/Pleura: Prominence of pulmonary interstitial markings and
thickening of interlobular septi in the subpleural lung zones is
again seen bilaterally, consistent with chronic interstitial lung
disease. Several ill-defined pulmonary nodules seen on previous
study have decreased in size since previous exam. Other small sub-cm
solid appearing pulmonary nodules are again noted, without
significant change. Largest of these seen in the left midlung
adjacent to the major fissure measures 7 mm, and most likely
represents an intrapulmonary lymph node. No evidence of pleural
effusion.

Musculoskeletal/Soft Tissues: No suspicious bone lesions or other
significant chest wall abnormality.

Upper Abdomen:  Unremarkable.
IMPRESSION: No significant change in small bilateral sub-cm solid pulmonary
nodules, which are likely benign. Continued attention on follow-up
CT recommended.

Chronic interstitial lung disease.  No acute findings identified.

## 2015-01-12 MED ORDER — IOHEXOL 300 MG/ML  SOLN
100.0000 mL | Freq: Once | INTRAMUSCULAR | Status: AC | PRN
  Administered 2015-01-12: 60 mL via INTRAVENOUS

## 2015-01-13 ENCOUNTER — Encounter: Payer: Self-pay | Admitting: Hematology

## 2015-01-13 ENCOUNTER — Telehealth: Payer: Self-pay | Admitting: Hematology

## 2015-01-13 ENCOUNTER — Ambulatory Visit (HOSPITAL_BASED_OUTPATIENT_CLINIC_OR_DEPARTMENT_OTHER): Payer: Medicare Other | Admitting: Hematology

## 2015-01-13 VITALS — BP 137/84 | HR 77 | Temp 97.7°F | Resp 18 | Ht 63.0 in | Wt 168.8 lb

## 2015-01-13 DIAGNOSIS — Z85048 Personal history of other malignant neoplasm of rectum, rectosigmoid junction, and anus: Secondary | ICD-10-CM

## 2015-01-13 DIAGNOSIS — I10 Essential (primary) hypertension: Secondary | ICD-10-CM

## 2015-01-13 DIAGNOSIS — F329 Major depressive disorder, single episode, unspecified: Secondary | ICD-10-CM

## 2015-01-13 DIAGNOSIS — R911 Solitary pulmonary nodule: Secondary | ICD-10-CM

## 2015-01-13 DIAGNOSIS — E119 Type 2 diabetes mellitus without complications: Secondary | ICD-10-CM

## 2015-01-13 DIAGNOSIS — C20 Malignant neoplasm of rectum: Secondary | ICD-10-CM

## 2015-01-13 DIAGNOSIS — R970 Elevated carcinoembryonic antigen [CEA]: Secondary | ICD-10-CM

## 2015-01-13 LAB — CEA: CEA: 8.1 ng/mL — ABNORMAL HIGH (ref 0.0–5.0)

## 2015-01-13 NOTE — Progress Notes (Signed)
[place]  [phone] [phone]  Clinic New Consult Note   Patient Care Team: Nolene Ebbs, MD as PCP - General (Internal Medicine) Truitt Merle, MD as Consulting Physician (Hematology) Leighton Ruff, MD as Consulting Physician (General Surgery) Arta Silence, MD as Consulting Physician (Gastroenterology) 01/13/2015  CHIEF COMPLAINTS/PURPOSE OF CONSULTATION:  History of rectal cancer, arising CEA    Oncology History   Rectal cancer   Staging form: Colon and Rectum, AJCC 7th Edition     Pathologic: Stage I (T1, N0, cM0) - Signed by Truitt Merle, MD on 09/26/2014       Rectal cancer   09/13/2012 Procedure Colonoscopy & endorectal ultrasound-Dr. Outlaw=Distal colonic polyp   11/14/2012 Procedure Sigmoidoscopy & Laparoscopy=Dr. Donne Hazel   01/29/2013 Pathologic Stage pT1N0cM0, stage I, well-differentiated adenocarcinoma arising in background of tubular villous adenoma, invading into the submucosa. No evidence of angiolymphatic invasion. 18 lymph nodes were negative.   01/29/2013 Surgery Rectosigmoid segmental resection by Dr. Marcello Moores. Surgical margin was negative.   02/04/2013 Initial Diagnosis Rectal cancer   03/11/2014 Imaging CT C/A/P=No acute abdominal/pelvic findings, mass lesions or adenopathy. No findings to suggest recurrent rectal cancer and no adenopathy or metastatic disease. 2. Emphysematous changes. 3. Suspect early interstitial lung disease. 4. Tortuous and mil   09/12/2014 Tumor Marker CEA=6.4   10/14/2014 Imaging PET showed new and enlarging vague pulmonary nodules bilaterally worrisome for metastatic disease, no other evidence of cancer recurrence    12/09/2014 Tumor Marker 7.5   01/12/2015 Imaging CT chest showed no significant change in small bilateral subcentimeter solid pulmonary nodules, which are likely benign. Chronic interstitial lung disease.   01/12/2015 Tumor Marker CEA 8.1    HISTORY OF PRESENTING ILLNESS:  Erica [name] 59 y.o. female is  here because of her history of stage I rectal cancer and recent abnormal CEA. She presents with her sister to the clinic today.   Her rectal cancer was diagnosed in August 2014 by screening colonoscopy. She was found to have multiple colon polyps in April 2014, which were removed by colonoscopy and pathology showedtubular or tubulovillous adenoma, and a rectal polyp biopsy showed high-grade dysplasia. She underwent LAR on 01/29/2013, surgical pathology showed invasive well-differentiated adenocarcinoma, pT1N0, 8 lymph nodes were negative. She has been followed by Dr. Marcello Moores since then. Her last CTs scan was in September 2015 which was negative for disease recurrence, and colonoscopy on 02/04/2014 showed no evidence of disease recurrence, except 3 colon polyps which were removed. Her CEA however on 09/12/2014 was elevated at 6.4. She was referred by Dr. Marcello Moores for further evaluation.  She has some residual low abdominal pain since the surgery, she has loose BM 2-4 times daily, no blood, appetite and energy level is good, no significant change lately, except low back pain for the past 6 month, which is 6/10, no radiation pain. No recently weight loss.   INTERIM HISTORY: Erica [name] returns for follow-up. She is doing well overall. She had back pain flare last week, and was treated for UTI and felt better. She has occasional low abdominal cramps, no nausea, bloating, change of her bowel habits. She'll continue to see blood in her stool she contributes to her hemorrhoids. She has good appetite and energy level, no other new complaints.   MEDICAL HISTORY:  Past Medical History  Diagnosis Date  . Diabetes mellitus   . Hypertension   . Mental disorder     depression  . Depression   . Asthma   . GERD (gastroesophageal reflux  disease)   . Headache(784.0)     "mild"  . Neuropathy     feet   . Arthritis     especially in shoulders  . Rectal polyp     very little bleeding with bowel movements- no pain  .  Pain     arthritis pain - takes tramadol as needed    SURGICAL HISTORY: Past Surgical History  Procedure Laterality Date  . Tonsillectomy and adenoidectomy    . Cesarean section    . Tonsillectomy    . Laparoscopic sigmoid colectomy N/A 11/14/2012    Procedure: DIAGNOSTIC LAPAROSCOPY AND SIGMOIDMOIDOSCOPY ;  Surgeon: Rolm Bookbinder, MD;  Location: [facility];  Service: General;  Laterality: N/A;  . Eus N/A 11/21/2012    Procedure: LOWER ENDOSCOPIC ULTRASOUND (EUS);  Surgeon: Arta Silence, MD;  Location: [facility];  Service: Endoscopy;  Laterality: N/A;  . Flexible sigmoidoscopy N/A 11/21/2012    Procedure: FLEXIBLE SIGMOIDOSCOPY;  Surgeon: Arta Silence, MD;  Location: [facility];  Service: Endoscopy;  Laterality: N/A;  . Flexible sigmoidoscopy N/A 01/28/2013    Procedure: FLEXIBLE SIGMOIDOSCOPY;  Surgeon: Leighton Ruff, MD;  Location: [facility];  Service: Endoscopy;  Laterality: N/A;  . Laparoscopic low anterior resection N/A 01/29/2013    Procedure: LAPAROSCOPIC LOW ANTERIOR RESECTION, Rigid Proctoscopy;  Surgeon: Leighton Ruff, MD;  Location: [facility];  Service: General;  Laterality: N/A;    SOCIAL HISTORY: History   Social History  . Marital Status: Single    Spouse Name: N/A  . Number of Children: One son, 79   . Years of Education: N/A   Occupational History  . Disabled due to depression, DM    Social History Main Topics  . Smoking status: Current Every Day Smoker -- 0.25 packs/day for 20 years    Types: Cigarettes  . Smokeless tobacco: Never Used     Comment: one every 2-3 days  . Alcohol Use: 0.0 oz/week     Comment: 3 cans beer a week      pt states crack cocaine was 5 or 6 yrs ago   . Drug Use: Yes    Special: "Crack" cocaine     Comment: last time was a year ago   . Sexual Activity: No   Other Topics Concern  . Not on file   Social History Narrative    FAMILY HISTORY: Family History  Problem Relation Age of Onset  . Hypertension Father   . Stroke  Father   . Diabetes Father   . Heart disease Mother   . Hypertension Mother   . Hyperlipidemia Mother   . Hypertension Sister   . Hypertension Brother   . Hypertension Sister   . Hypertension Brother   . Cancer Maternal Aunt     cancer, unknown type   . Cancer Maternal Uncle     bone cancer   . Cancer Paternal Aunt     lung cancer  . Cancer Paternal Uncle     lung cancer  . Cancer Maternal Uncle     colon cancer and prostate cancer   . Cancer Maternal Uncle     prostate cancer   . Cancer Other 47    colon cancer     ALLERGIES:  is allergic to penicillins.  MEDICATIONS:  Current Outpatient Prescriptions  Medication Sig Dispense Refill  . albuterol (PROVENTIL HFA;VENTOLIN HFA) 108 (90 BASE) MCG/ACT inhaler Inhale 2 puffs into the lungs every 4 (four) hours as needed for wheezing.    Marland Kitchen amLODipine (NORVASC)  10 MG tablet Take 10 mg by mouth every morning.     . ciprofloxacin (CIPRO) 500 MG tablet Take 1 tablet by mouth 2 (two) times daily.  0  . citalopram (CELEXA) 20 MG tablet Take 20 mg by mouth every morning.     . gabapentin (NEURONTIN) 300 MG capsule Take 300 mg by mouth 2 (two) times daily.     . hydrochlorothiazide (MICROZIDE) 12.5 MG capsule Take 12.5 mg by mouth every morning.    Marland Kitchen ibuprofen (ADVIL,MOTRIN) 800 MG tablet Take 1 tablet by mouth as needed.    . metFORMIN (GLUCOPHAGE) 1000 MG tablet Take 500 mg by mouth 2 (two) times daily with a meal.     . pantoprazole (PROTONIX) 40 MG tablet Take 40 mg by mouth daily.    . traMADol (ULTRAM) 50 MG tablet Take 50 mg by mouth every 6 (six) hours as needed for pain.     . pravastatin (PRAVACHOL) 10 MG tablet Take 10 mg by mouth daily.      No current facility-administered medications for this visit.    REVIEW OF SYSTEMS:   Constitutional: Denies fevers, chills or abnormal night sweats Eyes: Denies blurriness of vision, double vision or watery eyes Ears, nose, mouth, throat, and face: Denies mucositis or sore  throat Respiratory: mild intermittent dry cough, no dyspnea or wheezes Cardiovascular: Denies palpitation, chest discomfort or lower extremity swelling Gastrointestinal:  Denies nausea, heartburn or change in bowel habits Skin: Denies abnormal skin rashes Lymphatics: Denies new lymphadenopathy or easy bruising Neurological:Denies numbness, tingling or new weaknesses Behavioral/Psych: Mood is stable, no new changes  All other systems were reviewed with the patient and are negative.  PHYSICAL EXAMINATION: ECOG PERFORMANCE STATUS: 1 - Symptomatic but completely ambulatory  Filed Vitals:   01/13/15 1031  BP: 137/84  Pulse: 77  Temp: 97.7 F (36.5 C)  Resp: 18   Filed Weights   01/13/15 1031  Weight: 168 lb 12.8 oz (76.567 kg)    GENERAL:alert, no distress and comfortable SKIN: skin color, texture, turgor are normal, no rashes or significant lesions EYES: normal, conjunctiva are pink and non-injected, sclera clear OROPHARYNX:no exudate, no erythema and lips, buccal mucosa, and tongue normal  NECK: supple, thyroid normal size, non-tender, without nodularity LYMPH:  no palpable lymphadenopathy in the cervical, axillary or inguinal LUNGS: clear to auscultation and percussion with normal breathing effort HEART: regular rate & rhythm and no murmurs and no lower extremity edema ABDOMEN:abdomen soft, mild tenderness at the low pelvic area, bo rebound pain, and normal bowel sounds Musculoskeletal:no cyanosis of digits and no clubbing. (+) tenderness at the left upper back, posterior iliac crest area  PSYCH: alert & oriented x 3 with fluent speech NEURO: no focal motor/sensory deficits  LABORATORY DATA:  I have reviewed the data as listed CBC Latest Ref Rng 01/12/2015 10/28/2014 09/26/2014  WBC 3.9 - 10.3 10e3/uL 3.6(L) 4.7 4.3  Hemoglobin 11.6 - 15.9 g/dL 12.1 11.9 12.5  Hematocrit 34.8 - 46.6 % 34.1(L) 34.4(L) 34.5(L)  Platelets 145 - 400 10e3/uL 198 198 202    Recent Labs   04/18/14 1500 09/26/14 1012 10/28/14 1424 01/12/15 1444  NA 141 139 140 135*  K 4.5 4.5 4.2 3.8  CL 104  --   --   --   CO2  --  _0 GLUCOSE 179* 193* 177* 395*  BUN 14 21.5 22.5 20.7  CREATININE 1.10 1.3* 1.2* 1.5*  CALCIUM  --  7.3* 9.0 9.0  PROT  --  8.0 7.8 7.8  ALBUMIN  --  3.4* 3.4* 3.4*  AST  --  _0 ALT  --  _1 ALKPHOS  --  100 106 118  BILITOT  --  1.26* 1.01 1.41*   CEA  Status: Finalresult Visible to patient:  Not Released Nextappt: Today at 10:00 AM in Oncology Burr Medico, Krista Blue, MD) Dx:  Rectal cancer              Ref Range 1d ago  68moago  360mogo     CEA 0.0 - 5.0 ng/mL 8.1 (H) 7.0 (H) 7.1 (H)         PATHOLOGY REPORT: Diagnosis 02/04/2014 Surgical [P], ascending, transverse, descending, polyp - FRAGMENTS OF TUBULAR ADENOMAS (X3); NEGATIVE FOR HIGH GRADE DYSPLASIA OR MALIGNANCY.  Diagnosis 01/29/2013 1. Colon, segmental resection, rectosigmoid - INVASIVE WELL DIFFERENTIATED ADENOCARCINOMA ARISING IN BACKGROUND OF TUBULOVILLOUS ADENOMA, INVADING INTO THE SUBMUCOSA. - NO EVIDENCE OF ANGIOLYMPHATIC INVASION IDENTIFIED. - EIGHT LYMPH NODES, NEGATIVE FOR METASTATIC CARCINOMA (0/8 ), PLEASE SEE COMMENT - MULTIPLE SERRATED POLYPS/ADENOMA. - RESECTION MARGINS, NEGATIVE FOR ATYPIA OR MALIGNANCY. 2. Colon, resection margin (donut), anastomotic rings - BENIGN COLONIC MUCOSA, NO EVIDENCE OF MALIGNANCY. Microscopic Comment 1. COLON AND RECTUM Specimen: Rectosigmoid colon Procedure: Segmental resection Tumor site: Upper rectum Specimen integrity: Intact Macroscopic intactness of mesorectum: Near complete Macroscopic tumor perforation: No Invasive tumor: Maximum size: 1 cm Histologic type(s): Invasive adenocarcinoma Histologic grade and differentiation: G1: well differentiated/low grade Type of polyp in which invasive carcinoma arose: Tubulovillous adenoma Microscopic extension of invasive tumor: Invading into  submucosa Lymph-Vascular invasion: Not identified Peri-neural invasion: Not identified Tumor deposit(s) (discontinuous extramural extension): No Resection margins: Proximal: 11.2 cm Distal: 2.5 cm Radial: 1.6 cm Treatment effect (neoadjuvant therapy): Not identified Number of lymph nodes examined 6+2=8 ; Number positive 0 Additional polyp(s): Multiple serrated polyps. Non-neoplastic findings: Not identified Pathologic Staging: T1, N0, Mx Ancillary studies: The tumor block was sent to [facility] for MSI testing. Comment: The pericolonic fatty tissue was submitted for clearing process. Six lymph nodes were identified and sections show no evidence of metastatic carcinoma. Per Dr. AlLeighton Ruffrequest, ten additional tissue blocks of pericolonic fatty tissue were submitted for microscopic examination. Two tiny small lymph nodes were identified microscopically and show no evidence of metastatic carcinoma. Case was discussed with Dr. ThMarcello Mooresn 02-04-2013. RADIOGRAPHIC STUDIES: I have personally reviewed the radiological images as listed and agreed with the findings in the report.  Ct Chest W Contrast  01/12/2015   CLINICAL DATA:  Followup indeterminate pulmonary nodules. Rectal carcinoma.  EXAM: CT CHEST WITH CONTRAST  TECHNIQUE: Multidetector CT imaging of the chest was performed during intravenous contrast administration.  CONTRAST:  6052mMNIPAQUE IOHEXOL 300 MG/ML  SOLN  COMPARISON:  PET-CT on 10/14/2014 and chest CT on 03/11/2014  FINDINGS: Mediastinum/Lymph Nodes: No masses or pathologically enlarged lymph nodes identified. Small less than 1 cm mediastinal lymph nodes remain stable.  Lungs/Pleura: Prominence of pulmonary interstitial markings and thickening of interlobular septi in the subpleural lung zones is again seen bilaterally, consistent with chronic interstitial lung disease. Several ill-defined pulmonary nodules seen on previous study have decreased in size since previous exam. Other  small sub-cm solid appearing pulmonary nodules are again noted, without significant change. Largest of these seen in the left midlung adjacent to the major fissure measures 7 mm, and most likely represents an intrapulmonary lymph node. No evidence of pleural effusion.  Musculoskeletal/Soft Tissues: No suspicious bone lesions or other significant  chest wall abnormality.  Upper Abdomen:  Unremarkable.  IMPRESSION: No significant change in small bilateral sub-cm solid pulmonary nodules, which are likely benign. Continued attention on follow-up CT recommended.  Chronic interstitial lung disease.  No acute findings identified.   Electronically Signed   By: Earle Gell M.D.   On: 01/12/2015 16:13    ASSESSMENT & PLAN:  59 year old female, with past medical history of stage I rectal adenocarcinoma in August 2014, status post complete surgical resection. Now presents with a rising CEA.  1. History of rectal adenocarcinoma, stage I, arising CEA, indeterminate lung nodules -she is now 2 years out of her colon cancer surgery. -Her CEA has been mildly elevated, overall stable, it was 8.1 yesterday -I reviewed her CT chest from yesterday, which showed stable bilateral small lung nodules, some are smaller than last time, most likely benign changes. -I'll continue surveillance, we'll obtain CEA every 3 months, we'll repeat CT scan in 6 months, [place] if her CEA significantly higher in 3 month    2. HTN, DM, depression -Follow up with primary care physician -Her blood glucose was 395 yesterday, she was off metformin due to the CT scan, I strongly encouraged her to follow-up with her primary care physician  Follow-up: I'll see her back in 3 months with repeated lab   All questions were answered. The patient knows to call the clinic with any problems, questions or concerns. I spent 25 minutes counseling the patient face to face. The total time spent in the appointment was 30 minutes and more than 50% was on  counseling.     Truitt Merle, MD 01/13/2015 11:03 AM

## 2015-01-13 NOTE — Telephone Encounter (Signed)
per pof to sch pt appt-gave pt copy of avs °

## 2015-03-16 ENCOUNTER — Telehealth: Payer: Self-pay | Admitting: *Deleted

## 2015-03-16 NOTE — Telephone Encounter (Signed)
  Oncology Nurse Navigator Documentation    Navigator Encounter Type: 6 month;Telephone (03/16/15 1125): Left VM for Mahati to return call to GI Nurse Navigator.

## 2015-03-17 NOTE — Telephone Encounter (Signed)
Returned call to Ono: Bowel function is normal. Weight is stable. Her blood sugars are still not under control. PCP stopped her metformin and she is now on daily insulin 10 units and her sugars are up to 596. She has a call into her PCP and is awaiting return call with instructions. Has been on this dose for about 2 weeks now. Plans to ask PCP to refer her to dermatologist also for some areas on her skin. Reviewed her follow up appointments with her for lab and seeing Dr. Burr Medico. She has no needs from oncology standpoint.

## 2015-03-17 NOTE — Telephone Encounter (Signed)
Patient called requesting to speak with Randa Lynn navigator.  "Returning call."  Call transferred.

## 2015-04-08 ENCOUNTER — Other Ambulatory Visit (HOSPITAL_BASED_OUTPATIENT_CLINIC_OR_DEPARTMENT_OTHER): Payer: Commercial Managed Care - HMO

## 2015-04-08 DIAGNOSIS — R911 Solitary pulmonary nodule: Secondary | ICD-10-CM

## 2015-04-08 DIAGNOSIS — Z85048 Personal history of other malignant neoplasm of rectum, rectosigmoid junction, and anus: Secondary | ICD-10-CM

## 2015-04-08 DIAGNOSIS — C20 Malignant neoplasm of rectum: Secondary | ICD-10-CM

## 2015-04-08 LAB — CBC WITH DIFFERENTIAL/PLATELET
BASO%: 0.9 % (ref 0.0–2.0)
Basophils Absolute: 0 10*3/uL (ref 0.0–0.1)
EOS%: 2.5 % (ref 0.0–7.0)
Eosinophils Absolute: 0.1 10*3/uL (ref 0.0–0.5)
HCT: 34.3 % — ABNORMAL LOW (ref 34.8–46.6)
HGB: 12 g/dL (ref 11.6–15.9)
LYMPH%: 30.8 % (ref 14.0–49.7)
MCH: 33.3 pg (ref 25.1–34.0)
MCHC: 35.1 g/dL (ref 31.5–36.0)
MCV: 94.9 fL (ref 79.5–101.0)
MONO#: 0.3 10*3/uL (ref 0.1–0.9)
MONO%: 7.6 % (ref 0.0–14.0)
NEUT#: 2.2 10*3/uL (ref 1.5–6.5)
NEUT%: 58.2 % (ref 38.4–76.8)
Platelets: 196 10*3/uL (ref 145–400)
RBC: 3.61 10*6/uL — ABNORMAL LOW (ref 3.70–5.45)
RDW: 15 % — ABNORMAL HIGH (ref 11.2–14.5)
WBC: 3.8 10*3/uL — ABNORMAL LOW (ref 3.9–10.3)
lymph#: 1.2 10*3/uL (ref 0.9–3.3)

## 2015-04-08 LAB — COMPREHENSIVE METABOLIC PANEL (CC13)
ALT: 11 U/L (ref 0–55)
AST: 11 U/L (ref 5–34)
Albumin: 3.5 g/dL (ref 3.5–5.0)
Alkaline Phosphatase: 181 U/L — ABNORMAL HIGH (ref 40–150)
Anion Gap: 9 mEq/L (ref 3–11)
BUN: 30.4 mg/dL — ABNORMAL HIGH (ref 7.0–26.0)
CO2: 22 mEq/L (ref 22–29)
Calcium: 9.2 mg/dL (ref 8.4–10.4)
Chloride: 103 mEq/L (ref 98–109)
Creatinine: 1.7 mg/dL — ABNORMAL HIGH (ref 0.6–1.1)
EGFR: 37 mL/min/{1.73_m2} — ABNORMAL LOW (ref >90)
Glucose: 423 mg/dl — ABNORMAL HIGH (ref 70–140)
Potassium: 4.4 mEq/L (ref 3.5–5.1)
Sodium: 134 mEq/L — ABNORMAL LOW (ref 136–145)
Total Bilirubin: 0.97 mg/dL (ref 0.20–1.20)
Total Protein: 7.7 g/dL (ref 6.4–8.3)

## 2015-04-09 LAB — CEA: CEA: 6.9 ng/mL — ABNORMAL HIGH (ref 0.0–5.0)

## 2015-04-15 ENCOUNTER — Ambulatory Visit (HOSPITAL_BASED_OUTPATIENT_CLINIC_OR_DEPARTMENT_OTHER): Payer: Medicare Other | Admitting: Hematology

## 2015-04-15 ENCOUNTER — Encounter: Payer: Self-pay | Admitting: Hematology

## 2015-04-15 ENCOUNTER — Telehealth: Payer: Self-pay | Admitting: Hematology

## 2015-04-15 VITALS — BP 122/78 | HR 74 | Temp 98.4°F | Resp 18 | Ht 63.0 in | Wt 167.1 lb

## 2015-04-15 DIAGNOSIS — I10 Essential (primary) hypertension: Secondary | ICD-10-CM

## 2015-04-15 DIAGNOSIS — Z85048 Personal history of other malignant neoplasm of rectum, rectosigmoid junction, and anus: Secondary | ICD-10-CM

## 2015-04-15 DIAGNOSIS — N189 Chronic kidney disease, unspecified: Secondary | ICD-10-CM

## 2015-04-15 DIAGNOSIS — R911 Solitary pulmonary nodule: Secondary | ICD-10-CM

## 2015-04-15 DIAGNOSIS — E119 Type 2 diabetes mellitus without complications: Secondary | ICD-10-CM | POA: Diagnosis not present

## 2015-04-15 DIAGNOSIS — F329 Major depressive disorder, single episode, unspecified: Secondary | ICD-10-CM

## 2015-04-15 DIAGNOSIS — C20 Malignant neoplasm of rectum: Secondary | ICD-10-CM

## 2015-04-15 NOTE — Telephone Encounter (Signed)
Gave and printed appts ched and avs for pt for DEC  °

## 2015-04-15 NOTE — Progress Notes (Signed)
[facility]  [phone] [phone]  Clinic follow up Note   Patient Care Team: Nolene Ebbs, MD as PCP - General (Internal Medicine) Truitt Merle, MD as Consulting Physician (Hematology) Leighton Ruff, MD as Consulting Physician (General Surgery) Arta Silence, MD as Consulting Physician (Gastroenterology) 04/15/2015  CHIEF COMPLAINTS:  History of rectal cancer, elevated CEA    Oncology History   Rectal cancer   Staging form: Colon and Rectum, AJCC 7th Edition     Pathologic: Stage I (T1, N0, cM0) - Signed by Truitt Merle, MD on 09/26/2014       Rectal cancer ([place])   09/13/2012 Procedure Colonoscopy & endorectal ultrasound-Dr. Outlaw=Distal colonic polyp   11/14/2012 Procedure Sigmoidoscopy & Laparoscopy=Dr. Donne Hazel   01/29/2013 Pathologic Stage pT1N0cM0, stage I, well-differentiated adenocarcinoma arising in background of tubular villous adenoma, invading into the submucosa. No evidence of angiolymphatic invasion. 18 lymph nodes were negative.   01/29/2013 Surgery Rectosigmoid segmental resection by Dr. Marcello Moores. Surgical margin was negative.   02/04/2013 Initial Diagnosis Rectal cancer   03/11/2014 Imaging CT C/A/P=No acute abdominal/pelvic findings, mass lesions or adenopathy. No findings to suggest recurrent rectal cancer and no adenopathy or metastatic disease. 2. Emphysematous changes. 3. Suspect early interstitial lung disease. 4. Tortuous and mil   09/12/2014 Tumor Marker CEA=6.4   10/14/2014 Imaging PET showed new and enlarging vague pulmonary nodules bilaterally worrisome for metastatic disease, no other evidence of cancer recurrence    12/09/2014 Tumor Marker 7.5   01/12/2015 Imaging CT chest showed no significant change in small bilateral subcentimeter solid pulmonary nodules, which are likely benign. Chronic interstitial lung disease.   01/12/2015 Tumor Marker CEA 8.1    HISTORY OF PRESENTING ILLNESS:  Erica [name] 59 y.o. female is here because of  her history of stage I rectal cancer and recent abnormal CEA. She presents with her sister to the clinic today.   Her rectal cancer was diagnosed in August 2014 by screening colonoscopy. She was found to have multiple colon polyps in April 2014, which were removed by colonoscopy and pathology showedtubular or tubulovillous adenoma, and a rectal polyp biopsy showed high-grade dysplasia. She underwent LAR on 01/29/2013, surgical pathology showed invasive well-differentiated adenocarcinoma, pT1N0, 8 lymph nodes were negative. She has been followed by Dr. Marcello Moores since then. Her last CTs scan was in September 2015 which was negative for disease recurrence, and colonoscopy on 02/04/2014 showed no evidence of disease recurrence, except 3 colon polyps which were removed. Her CEA however on 09/12/2014 was elevated at 6.4. She was referred by Dr. Marcello Moores for further evaluation.  She has some residual low abdominal pain since the surgery, she has loose BM 2-4 times daily, no blood, appetite and energy level is good, no significant change lately, except low back pain for the past 6 month, which is 6/10, no radiation pain. No recently weight loss.   INTERIM HISTORY: Evan returns for follow-up. She is not feeling very well lately. She complains of intermittent nausea, and she vomited a few times. She has occasional low abdominal cramps, her bowel movements normal, no change. Her other main concern is her uncontrolled hyperglycemia. She has been diabetic for 5 years, her blood glucose has been controlled in the range of 100-200 up to a few months ago, and her BG has been in 300-450 mg/dl lately. Her primary care physician initially increased her metformin dose, and just started her on insulin earlier this week. Her appetite and po intake are fair, and she lost about 10  lbs in the past 6 months.   MEDICAL HISTORY:  Past Medical History  Diagnosis Date  . Diabetes mellitus   . Hypertension   . Mental disorder      depression  . Depression   . Asthma   . GERD (gastroesophageal reflux disease)   . Headache(784.0)     "mild"  . Neuropathy ([facility])     feet   . Arthritis     especially in shoulders  . Rectal polyp     very little bleeding with bowel movements- no pain  . Pain     arthritis pain - takes tramadol as needed    SURGICAL HISTORY: Past Surgical History  Procedure Laterality Date  . Tonsillectomy and adenoidectomy    . Cesarean section    . Tonsillectomy    . Laparoscopic sigmoid colectomy N/A 11/14/2012    Procedure: DIAGNOSTIC LAPAROSCOPY AND SIGMOIDMOIDOSCOPY ;  Surgeon: Rolm Bookbinder, MD;  Location: [facility];  Service: General;  Laterality: N/A;  . Eus N/A 11/21/2012    Procedure: LOWER ENDOSCOPIC ULTRASOUND (EUS);  Surgeon: Arta Silence, MD;  Location: [facility];  Service: Endoscopy;  Laterality: N/A;  . Flexible sigmoidoscopy N/A 11/21/2012    Procedure: FLEXIBLE SIGMOIDOSCOPY;  Surgeon: Arta Silence, MD;  Location: [facility];  Service: Endoscopy;  Laterality: N/A;  . Flexible sigmoidoscopy N/A 01/28/2013    Procedure: FLEXIBLE SIGMOIDOSCOPY;  Surgeon: Leighton Ruff, MD;  Location: [facility];  Service: Endoscopy;  Laterality: N/A;  . Laparoscopic low anterior resection N/A 01/29/2013    Procedure: LAPAROSCOPIC LOW ANTERIOR RESECTION, Rigid Proctoscopy;  Surgeon: Leighton Ruff, MD;  Location: [facility];  Service: General;  Laterality: N/A;    SOCIAL HISTORY: History   Social History  . Marital Status: Single    Spouse Name: N/A  . Number of Children: One son, 70   . Years of Education: N/A   Occupational History  . Disabled due to depression, DM    Social History Main Topics  . Smoking status: Current Every Day Smoker -- 0.25 packs/day for 20 years    Types: Cigarettes  . Smokeless tobacco: Never Used     Comment: one every 2-3 days  . Alcohol Use: 0.0 oz/week     Comment: 3 cans beer a week      pt states crack cocaine was 5 or 6 yrs ago   . Drug Use: Yes     Special: "Crack" cocaine     Comment: last time was a year ago   . Sexual Activity: No   Other Topics Concern  . Not on file   Social History Narrative    FAMILY HISTORY: Family History  Problem Relation Age of Onset  . Hypertension Father   . Stroke Father   . Diabetes Father   . Heart disease Mother   . Hypertension Mother   . Hyperlipidemia Mother   . Hypertension Sister   . Hypertension Brother   . Hypertension Sister   . Hypertension Brother   . Cancer Maternal Aunt     cancer, unknown type   . Cancer Maternal Uncle     bone cancer   . Cancer Paternal Aunt     lung cancer  . Cancer Paternal Uncle     lung cancer  . Cancer Maternal Uncle     colon cancer and prostate cancer   . Cancer Maternal Uncle     prostate cancer   . Cancer Other 47    colon cancer  ALLERGIES:  is allergic to penicillins.  MEDICATIONS:  Current Outpatient Prescriptions  Medication Sig Dispense Refill  . albuterol (PROVENTIL HFA;VENTOLIN HFA) 108 (90 BASE) MCG/ACT inhaler Inhale 2 puffs into the lungs every 4 (four) hours as needed for wheezing.    Marland Kitchen amLODipine (NORVASC) 10 MG tablet Take 10 mg by mouth every morning.     . ciprofloxacin (CIPRO) 500 MG tablet Take 1 tablet by mouth 2 (two) times daily.  0  . citalopram (CELEXA) 20 MG tablet Take 20 mg by mouth every morning.     . gabapentin (NEURONTIN) 300 MG capsule Take 300 mg by mouth 2 (two) times daily.     . hydrochlorothiazide (MICROZIDE) 12.5 MG capsule Take 12.5 mg by mouth every morning.    Marland Kitchen ibuprofen (ADVIL,MOTRIN) 800 MG tablet Take 1 tablet by mouth as needed.    . metFORMIN (GLUCOPHAGE) 1000 MG tablet Take 500 mg by mouth 2 (two) times daily with a meal.     . pantoprazole (PROTONIX) 40 MG tablet Take 40 mg by mouth daily.    . pravastatin (PRAVACHOL) 10 MG tablet Take 10 mg by mouth daily.     . traMADol (ULTRAM) 50 MG tablet Take 50 mg by mouth every 6 (six) hours as needed for pain.      No current  facility-administered medications for this visit.    REVIEW OF SYSTEMS:   Constitutional: Denies fevers, chills or abnormal night sweats Eyes: Denies blurriness of vision, double vision or watery eyes Ears, nose, mouth, throat, and face: Denies mucositis or sore throat Respiratory: mild intermittent dry cough, no dyspnea or wheezes Cardiovascular: Denies palpitation, chest discomfort or lower extremity swelling Gastrointestinal:  Denies nausea, heartburn or change in bowel habits Skin: Denies abnormal skin rashes Lymphatics: Denies new lymphadenopathy or easy bruising Neurological:Denies numbness, tingling or new weaknesses Behavioral/Psych: Mood is stable, no new changes  All other systems were reviewed with the patient and are negative.  PHYSICAL EXAMINATION: ECOG PERFORMANCE STATUS: 1 - Symptomatic but completely ambulatory  Filed Vitals:   04/15/15 1002  BP: 122/78  Pulse: 74  Temp: 98.4 F (36.9 C)  Resp: 18   Filed Weights   04/15/15 1002  Weight: 167 lb 1.6 oz (75.796 kg)    GENERAL:alert, no distress and comfortable SKIN: skin color, texture, turgor are normal, no rashes or significant lesions EYES: normal, conjunctiva are pink and non-injected, sclera clear OROPHARYNX:no exudate, no erythema and lips, buccal mucosa, and tongue normal  NECK: supple, thyroid normal size, non-tender, without nodularity LYMPH:  no palpable lymphadenopathy in the cervical, axillary or inguinal LUNGS: clear to auscultation and percussion with normal breathing effort HEART: regular rate & rhythm and no murmurs and no lower extremity edema ABDOMEN:abdomen soft, mild tenderness at the low pelvic area, bo rebound pain, and normal bowel sounds Musculoskeletal:no cyanosis of digits and no clubbing. No focal tenderness  PSYCH: alert & oriented x 3 with fluent speech NEURO: no focal motor/sensory deficits  LABORATORY DATA:  I have reviewed the data as listed CBC Latest Ref Rng 04/08/2015  01/12/2015 10/28/2014  WBC 3.9 - 10.3 10e3/uL 3.8(L) 3.6(L) 4.7  Hemoglobin 11.6 - 15.9 g/dL 12.0 12.1 11.9  Hematocrit 34.8 - 46.6 % 34.3(L) 34.1(L) 34.4(L)  Platelets 145 - 400 10e3/uL 196 198 198    Recent Labs  04/18/14 1500  10/28/14 1424 01/12/15 1444 04/08/15 0928  NA 141  < > 140 135* 134*  K 4.5  < > 4.2 3.8 4.4  CL 104  --   --   --   --  CO2  --   < > _0 GLUCOSE 179*  < > 177* 395* 423*  BUN 14  < > 22.5 20.7 30.4*  CREATININE 1.10  < > 1.2* 1.5* 1.7*  CALCIUM  --   < > 9.0 9.0 9.2  PROT  --   < > 7.8 7.8 7.7  ALBUMIN  --   < > 3.4* 3.4* 3.5  AST  --   < > _1 ALT  --   < > _2 ALKPHOS  --   < > 106 118 181*  BILITOT  --   < > 1.01 1.41* 0.97  < > = values in this interval not displayed.   CEA  Status: Finalresult Visible to patient:  Not Released Nextappt: Today at 09:30 AM in Oncology Burr Medico, Krista Blue, MD) Dx:  Rectal cancer [facility])              Ref Range 7d ago  62moago  573mogo  13m33moo     CEA 0.0 - 5.0 ng/mL 6.9 (H) 8.1 (H) 7.0 (H) 7.1 (H)         PATHOLOGY REPORT: Diagnosis 02/04/2014 Surgical [P], ascending, transverse, descending, polyp - FRAGMENTS OF TUBULAR ADENOMAS (X3); NEGATIVE FOR HIGH GRADE DYSPLASIA OR MALIGNANCY.  Diagnosis 01/29/2013 1. Colon, segmental resection, rectosigmoid - INVASIVE WELL DIFFERENTIATED ADENOCARCINOMA ARISING IN BACKGROUND OF TUBULOVILLOUS ADENOMA, INVADING INTO THE SUBMUCOSA. - NO EVIDENCE OF ANGIOLYMPHATIC INVASION IDENTIFIED. - EIGHT LYMPH NODES, NEGATIVE FOR METASTATIC CARCINOMA (0/8 ), PLEASE SEE COMMENT - MULTIPLE SERRATED POLYPS/ADENOMA. - RESECTION MARGINS, NEGATIVE FOR ATYPIA OR MALIGNANCY. 2. Colon, resection margin (donut), anastomotic rings - BENIGN COLONIC MUCOSA, NO EVIDENCE OF MALIGNANCY. Microscopic Comment 1. COLON AND RECTUM Specimen: Rectosigmoid colon Procedure: Segmental resection Tumor site: Upper rectum Specimen integrity: Intact Macroscopic  intactness of mesorectum: Near complete Macroscopic tumor perforation: No Invasive tumor: Maximum size: 1 cm Histologic type(s): Invasive adenocarcinoma Histologic grade and differentiation: G1: well differentiated/low grade Type of polyp in which invasive carcinoma arose: Tubulovillous adenoma Microscopic extension of invasive tumor: Invading into submucosa Lymph-Vascular invasion: Not identified Peri-neural invasion: Not identified Tumor deposit(s) (discontinuous extramural extension): No Resection margins: Proximal: 11.2 cm Distal: 2.5 cm Radial: 1.6 cm Treatment effect (neoadjuvant therapy): Not identified Number of lymph nodes examined 6+2=8 ; Number positive 0 Additional polyp(s): Multiple serrated polyps. Non-neoplastic findings: Not identified Pathologic Staging: T1, N0, Mx Ancillary studies: The tumor block was sent to [facility] for MSI testing. Comment: The pericolonic fatty tissue was submitted for clearing process. Six lymph nodes were identified and sections show no evidence of metastatic carcinoma. Per Dr. AliLeighton Ruffequest, ten additional tissue blocks of pericolonic fatty tissue were submitted for microscopic examination. Two tiny small lymph nodes were identified microscopically and show no evidence of metastatic carcinoma. Case was discussed with Dr. ThoMarcello Moores 02-04-2013. RADIOGRAPHIC STUDIES: I have personally reviewed the radiological images as listed and agreed with the findings in the report.  No results found.  ASSESSMENT & PLAN:  59 6ar old female, with past medical history of stage I rectal adenocarcinoma in August 2014, status post complete surgical resection. Now presents with a rising CEA.  1. History of rectal adenocarcinoma, stage I, arising CEA, indeterminate lung nodules -she is now 2 years out of her colon cancer surgery. -Her CEA has been mildly elevated, overall stable, it was 6.9 last week -her repeated CT chest from 01/12/2015 showed stable  bilateral small lung nodules, some are smaller than last  time, most likely benign changes. -given her GI symptoms (nausea and mild abdominal pain), and recently uncontrolled hyperglycemia, I would like to obtain a repeated PET scan (not able to do CT with contrast due to her renal issue) -If PET scan negative, I'll continue surveillance, she will follow up with lab including CEA every 3-4 month, if NED, then every 6-12 months -She is scheduled to see Dr. Marcello Moores and GI for colonoscopy later this month    2. HTN, DM, depression -Follow up with primary care physician -Her Cr is 1.7 with GFR 37 last week, I am wondering if she needs to come off metformin and just use long and short acting insulin  3. CKD -her Cr has been increasing in the past 3 month, likely related to uncontrolled DM -will avoid nephrotoxins such as NSAIDs and IV contrast  Follow-up: I'll see her back in 1 month with PET scan. She will follow up with Dr. Jeanie Cooks for her DM and CKD medication adjustment    All questions were answered. The patient knows to call the clinic with any problems, questions or concerns. I spent 20 minutes counseling the patient face to face. The total time spent in the appointment was 25 minutes and more than 50% was on counseling.     Truitt Merle, MD 04/15/2015 7:06 AM

## 2015-05-06 ENCOUNTER — Ambulatory Visit (HOSPITAL_COMMUNITY)
Admission: RE | Admit: 2015-05-06 | Discharge: 2015-05-06 | Disposition: A | Discharge status: Home / Self Care | Payer: Commercial Managed Care - HMO | Source: Ambulatory Visit | Attending: Hematology | Admitting: Hematology

## 2015-05-06 ENCOUNTER — Telehealth: Payer: Self-pay | Admitting: *Deleted

## 2015-05-06 DIAGNOSIS — Z0189 Encounter for other specified special examinations: Secondary | ICD-10-CM | POA: Insufficient documentation

## 2015-05-06 DIAGNOSIS — C20 Malignant neoplasm of rectum: Secondary | ICD-10-CM

## 2015-05-06 LAB — GLUCOSE, CAPILLARY: Glucose-Capillary: 308 mg/dL — ABNORMAL HIGH (ref 65–99)

## 2015-05-06 NOTE — Telephone Encounter (Signed)
Erica [name] with [name] called reporting "patient's blood sugar was elevated too high past the limit for PET scan today.  FYI to notify Dr. Burr Medico scan has been rescheduled."  Patient's PET scan has been rescheduled to May 14, 2015 at 10:00 pm.  Next scheduled F/U 05-20-2015.

## 2015-05-13 DIAGNOSIS — I10 Essential (primary) hypertension: Secondary | ICD-10-CM | POA: Diagnosis not present

## 2015-05-14 ENCOUNTER — Encounter (HOSPITAL_COMMUNITY)
Admission: RE | Admit: 2015-05-14 | Discharge: 2015-05-14 | Disposition: A | Discharge status: Home / Self Care | Payer: Commercial Managed Care - HMO | Source: Ambulatory Visit | Attending: Hematology | Admitting: Hematology

## 2015-05-14 DIAGNOSIS — C20 Malignant neoplasm of rectum: Secondary | ICD-10-CM | POA: Insufficient documentation

## 2015-05-14 DIAGNOSIS — J849 Interstitial pulmonary disease, unspecified: Secondary | ICD-10-CM | POA: Insufficient documentation

## 2015-05-14 DIAGNOSIS — K5730 Diverticulosis of large intestine without perforation or abscess without bleeding: Secondary | ICD-10-CM | POA: Diagnosis not present

## 2015-05-14 LAB — GLUCOSE, CAPILLARY: Glucose-Capillary: 229 mg/dL — ABNORMAL HIGH (ref 65–99)

## 2015-05-14 IMAGING — CT NM PET TUM IMG RESTAG (PS) SKULL BASE T - THIGH
1 of 7 series · 1 of 25 positions shown · non-contrast
Comparison: [DATE]

CLINICAL DATA: Subsequent treatment strategy for rectal carcinoma.

EXAM:
NUCLEAR MEDICINE PET SKULL BASE TO THIGH
TECHNIQUE: 8.9 mCi F-18 FDG was injected intravenously. Full-ring PET imaging
was performed from the skull base to thigh after the radiotracer. CT
data was obtained and used for attenuation correction and anatomic
localization.
FASTING BLOOD GLUCOSE:  Value: 229 mg/dl

[Series 3: pet sk_thigh ac · axial · 5.0mm · 4.07mm/px · 1 of 206 slices shown]
[im 103/206]
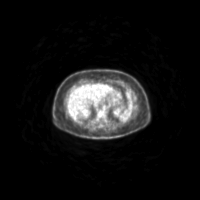

[1 of 25 positions shown; findings below may reference images not displayed]

FINDINGS: NECK

No hypermetabolic lymph nodes in the neck.

CHEST

No hypermetabolic mediastinal or hilar nodes. 9 mm right
paratracheal mediastinal lymph node on image 62/series 4 is slightly
decreased in size compared to 11 mm previously, and shows no
metabolic activity.

Chronic interstitial lung disease is again demonstrated. Small
ill-defined nodular opacities in both lungs have resolved or
decreased in size compared to previous study, consistent with
resolving inflammatory or infectious etiology. Stable sub-cm left
lung nodules along the major fissure are likely due to small
pulmonary lymph nodes.

ABDOMEN/PELVIS

No abnormal hypermetabolic activity within the liver, pancreas,
adrenal glands, or spleen. No hypermetabolic lymph nodes in the
abdomen or pelvis.

Colonic diverticulosis again noted, without evidence of
diverticulitis. No hypermetabolic masses or nodes seen in rectal
region.

SKELETON

No focal hypermetabolic activity to suggest skeletal metastasis.
IMPRESSION: No evidence of recurrent or metastatic carcinoma within the chest,
abdomen, or pelvis.

Resolved and/or decreased size of ill-defined bilateral pulmonary
nodular opacities, consistent with resolving infectious or
inflammatory etiology. Chronic interstitial lung disease again
noted.

## 2015-05-14 MED ORDER — FLUDEOXYGLUCOSE F - 18 (FDG) INJECTION
8.8500 | Freq: Once | INTRAVENOUS | Status: AC | PRN
  Administered 2015-05-14: 8.85 via INTRAVENOUS

## 2015-05-20 ENCOUNTER — Telehealth: Payer: Self-pay | Admitting: Hematology

## 2015-05-20 ENCOUNTER — Encounter: Payer: Medicare Other | Admitting: Hematology

## 2015-05-20 ENCOUNTER — Encounter: Payer: Self-pay | Admitting: Hematology

## 2015-05-20 NOTE — Telephone Encounter (Signed)
pt cld to r/s appt-gave r/s time & date

## 2015-05-20 NOTE — Progress Notes (Signed)
  This encounter was created in error - please disregard.

## 2015-05-27 ENCOUNTER — Encounter: Payer: Self-pay | Admitting: Hematology

## 2015-05-27 ENCOUNTER — Encounter: Payer: Commercial Managed Care - HMO | Admitting: Hematology

## 2015-05-27 NOTE — Progress Notes (Signed)
  This encounter was created in error - please disregard.

## 2015-07-17 DIAGNOSIS — K219 Gastro-esophageal reflux disease without esophagitis: Secondary | ICD-10-CM | POA: Diagnosis not present

## 2015-07-17 DIAGNOSIS — J4520 Mild intermittent asthma, uncomplicated: Secondary | ICD-10-CM | POA: Diagnosis not present

## 2015-07-17 DIAGNOSIS — M79609 Pain in unspecified limb: Secondary | ICD-10-CM | POA: Diagnosis not present

## 2015-07-17 DIAGNOSIS — I10 Essential (primary) hypertension: Secondary | ICD-10-CM | POA: Diagnosis not present

## 2015-07-17 DIAGNOSIS — E1321 Other specified diabetes mellitus with diabetic nephropathy: Secondary | ICD-10-CM | POA: Diagnosis not present

## 2015-07-17 DIAGNOSIS — Z719 Counseling, unspecified: Secondary | ICD-10-CM | POA: Diagnosis not present

## 2015-07-21 ENCOUNTER — Telehealth: Payer: Self-pay | Admitting: Hematology

## 2015-07-21 NOTE — Telephone Encounter (Signed)
pt wanted to r/s appt-gave pt copy of avs

## 2015-07-29 ENCOUNTER — Encounter: Payer: Self-pay | Admitting: Hematology

## 2015-07-29 ENCOUNTER — Ambulatory Visit (HOSPITAL_BASED_OUTPATIENT_CLINIC_OR_DEPARTMENT_OTHER): Payer: Medicare HMO | Admitting: Hematology

## 2015-07-29 ENCOUNTER — Ambulatory Visit (HOSPITAL_BASED_OUTPATIENT_CLINIC_OR_DEPARTMENT_OTHER): Payer: Medicare HMO

## 2015-07-29 ENCOUNTER — Telehealth: Payer: Self-pay | Admitting: Hematology

## 2015-07-29 VITALS — BP 147/79 | HR 76 | Temp 97.4°F | Resp 18 | Ht 63.0 in | Wt 174.5 lb

## 2015-07-29 DIAGNOSIS — E119 Type 2 diabetes mellitus without complications: Secondary | ICD-10-CM | POA: Diagnosis not present

## 2015-07-29 DIAGNOSIS — Z720 Tobacco use: Secondary | ICD-10-CM

## 2015-07-29 DIAGNOSIS — R911 Solitary pulmonary nodule: Secondary | ICD-10-CM | POA: Diagnosis not present

## 2015-07-29 DIAGNOSIS — C20 Malignant neoplasm of rectum: Secondary | ICD-10-CM | POA: Diagnosis not present

## 2015-07-29 DIAGNOSIS — S6991XA Unspecified injury of right wrist, hand and finger(s), initial encounter: Secondary | ICD-10-CM

## 2015-07-29 DIAGNOSIS — Z85038 Personal history of other malignant neoplasm of large intestine: Secondary | ICD-10-CM

## 2015-07-29 DIAGNOSIS — F329 Major depressive disorder, single episode, unspecified: Secondary | ICD-10-CM

## 2015-07-29 DIAGNOSIS — N189 Chronic kidney disease, unspecified: Secondary | ICD-10-CM

## 2015-07-29 DIAGNOSIS — I10 Essential (primary) hypertension: Secondary | ICD-10-CM

## 2015-07-29 LAB — CBC WITH DIFFERENTIAL/PLATELET
BASO%: 0.4 % (ref 0.0–2.0)
Basophils Absolute: 0 10*3/uL (ref 0.0–0.1)
EOS%: 1.7 % (ref 0.0–7.0)
Eosinophils Absolute: 0.1 10*3/uL (ref 0.0–0.5)
HCT: 38 % (ref 34.8–46.6)
HGB: 12.9 g/dL (ref 11.6–15.9)
LYMPH%: 16.3 % (ref 14.0–49.7)
MCH: 31.7 pg (ref 25.1–34.0)
MCHC: 34.1 g/dL (ref 31.5–36.0)
MCV: 93 fL (ref 79.5–101.0)
MONO#: 0.4 10*3/uL (ref 0.1–0.9)
MONO%: 6.1 % (ref 0.0–14.0)
NEUT#: 4.9 10*3/uL (ref 1.5–6.5)
NEUT%: 75.5 % (ref 38.4–76.8)
Platelets: 186 10*3/uL (ref 145–400)
RBC: 4.08 10*6/uL (ref 3.70–5.45)
RDW: 14.5 % (ref 11.2–14.5)
WBC: 6.5 10*3/uL (ref 3.9–10.3)
lymph#: 1.1 10*3/uL (ref 0.9–3.3)

## 2015-07-29 LAB — COMPREHENSIVE METABOLIC PANEL
ALT: 11 U/L (ref 0–55)
AST: 15 U/L (ref 5–34)
Albumin: 3.9 g/dL (ref 3.5–5.0)
Alkaline Phosphatase: 122 U/L (ref 40–150)
Anion Gap: 10 mEq/L (ref 3–11)
BUN: 25.3 mg/dL (ref 7.0–26.0)
CO2: 23 mEq/L (ref 22–29)
Calcium: 9.5 mg/dL (ref 8.4–10.4)
Chloride: 106 mEq/L (ref 98–109)
Creatinine: 1.6 mg/dL — ABNORMAL HIGH (ref 0.6–1.1)
EGFR: 40 mL/min/{1.73_m2} — ABNORMAL LOW (ref >90)
Glucose: 264 mg/dl — ABNORMAL HIGH (ref 70–140)
Potassium: 4.6 mEq/L (ref 3.5–5.1)
Sodium: 139 mEq/L (ref 136–145)
Total Bilirubin: 1.49 mg/dL — ABNORMAL HIGH (ref 0.20–1.20)
Total Protein: 8.9 g/dL — ABNORMAL HIGH (ref 6.4–8.3)

## 2015-07-29 NOTE — Telephone Encounter (Signed)
per pof to sch pt appt-gave pt copy of avs-sent back to la

## 2015-07-29 NOTE — Telephone Encounter (Signed)
tried calling patient again, phone saying not avaiable right now

## 2015-07-29 NOTE — Telephone Encounter (Signed)
per pof to sch pt appt for Xray-pt left and could not get hrough phone-Dr Burr Medico put order in after pt left labs

## 2015-07-29 NOTE — Progress Notes (Signed)
[place]  [phone] Fax:([phone]) [phone]  Clinic follow up Note   Patient Care Team: Nolene Ebbs, MD as PCP - General (Internal Medicine) Truitt Merle, MD as Consulting Physician (Hematology) Leighton Ruff, MD as Consulting Physician (General Surgery) Arta Silence, MD as Consulting Physician (Gastroenterology) 07/29/2015  CHIEF COMPLAINTS:  History of rectal cancer, elevated CEA    Oncology History   Rectal cancer   Staging form: Colon and Rectum, AJCC 7th Edition     Pathologic: Stage I (T1, N0, cM0) - Signed by Truitt Merle, MD on 09/26/2014       Rectal cancer ([place])   09/13/2012 Procedure Colonoscopy & endorectal ultrasound-Dr. Outlaw=Distal colonic polyp   11/14/2012 Procedure Sigmoidoscopy & Laparoscopy=Dr. Donne Hazel   01/29/2013 Pathologic Stage pT1N0cM0, stage I, well-differentiated adenocarcinoma arising in background of tubular villous adenoma, invading into the submucosa. No evidence of angiolymphatic invasion. 18 lymph nodes were negative.   01/29/2013 Surgery Rectosigmoid segmental resection by Dr. Marcello Moores. Surgical margin was negative.   02/04/2013 Initial Diagnosis Rectal cancer   03/11/2014 Imaging CT C/A/P=No acute abdominal/pelvic findings, mass lesions or adenopathy. No findings to suggest recurrent rectal cancer and no adenopathy or metastatic disease. 2. Emphysematous changes. 3. Suspect early interstitial lung disease. 4. Tortuous and mil   09/12/2014 Tumor Marker CEA=6.4   10/14/2014 Imaging PET showed new and enlarging vague pulmonary nodules bilaterally worrisome for metastatic disease, no other evidence of cancer recurrence    12/09/2014 Tumor Marker 7.5   01/12/2015 Imaging CT chest showed no significant change in small bilateral subcentimeter solid pulmonary nodules, which are likely benign. Chronic interstitial lung disease.   01/12/2015 Tumor Marker CEA 8.1    HISTORY OF PRESENTING ILLNESS:  Erica [name] 60 y.o. female is here because of  her history of stage I rectal cancer and recent abnormal CEA. She presents with her sister to the clinic today.   Her rectal cancer was diagnosed in August 2014 by screening colonoscopy. She was found to have multiple colon polyps in April 2014, which were removed by colonoscopy and pathology showedtubular or tubulovillous adenoma, and a rectal polyp biopsy showed high-grade dysplasia. She underwent LAR on 01/29/2013, surgical pathology showed invasive well-differentiated adenocarcinoma, pT1N0, 8 lymph nodes were negative. She has been followed by Dr. Marcello Moores since then. Her last CTs scan was in September 2015 which was negative for disease recurrence, and colonoscopy on 02/04/2014 showed no evidence of disease recurrence, except 3 colon polyps which were removed. Her CEA however on 09/12/2014 was elevated at 6.4. She was referred by Dr. Marcello Moores for further evaluation.  She has some residual low abdominal pain since the surgery, she has loose BM 2-4 times daily, no blood, appetite and energy level is good, no significant change lately, except low back pain for the past 6 month, which is 6/10, no radiation pain. No recently weight loss.   CURRENT THERAPY: Surveillance  INTERIM HISTORY: Erica [name] returns for follow-up. She was initially scheduled to see me in early December, and has rescheduled her appointment twice due to cold and a sinus infection. She complains about bilateral leg stiffness and pain in the past 2 months, especially when she walks. She was seen by her primary care physician, and was giving pain medication tramadol, and her diabetic and hypertension medication will adjusted also. She also has arthritis pain in her fingers, ankles. She incidentally slammed her finger at a door last week, and still has moderate pain and limited range of motion Of the right middle finger.  MEDICAL HISTORY:  Past Medical History  Diagnosis Date  . Diabetes mellitus   . Hypertension   . Mental disorder      depression  . Depression   . Asthma   . GERD (gastroesophageal reflux disease)   . Headache(784.0)     "mild"  . Neuropathy (H[facility])     feet   . Arthritis     especially in shoulders  . Rectal polyp     very little bleeding with bowel movements- no pain  . Pain     arthritis pain - takes tramadol as needed    SURGICAL HISTORY: Past Surgical History  Procedure Laterality Date  . Tonsillectomy and adenoidectomy    . Cesarean section    . Tonsillectomy    . Laparoscopic sigmoid colectomy N/A 11/14/2012    Procedure: DIAGNOSTIC LAPAROSCOPY AND SIGMOIDMOIDOSCOPY ;  Surgeon: Rolm Bookbinder, MD;  Location: [facility];  Service: General;  Laterality: N/A;  . Eus N/A 11/21/2012    Procedure: LOWER ENDOSCOPIC ULTRASOUND (EUS);  Surgeon: Arta Silence, MD;  Location: [facility];  Service: Endoscopy;  Laterality: N/A;  . Flexible sigmoidoscopy N/A 11/21/2012    Procedure: FLEXIBLE SIGMOIDOSCOPY;  Surgeon: Arta Silence, MD;  Location: [facility];  Service: Endoscopy;  Laterality: N/A;  . Flexible sigmoidoscopy N/A 01/28/2013    Procedure: FLEXIBLE SIGMOIDOSCOPY;  Surgeon: Leighton Ruff, MD;  Location: [facility];  Service: Endoscopy;  Laterality: N/A;  . Laparoscopic low anterior resection N/A 01/29/2013    Procedure: LAPAROSCOPIC LOW ANTERIOR RESECTION, Rigid Proctoscopy;  Surgeon: Leighton Ruff, MD;  Location: [facility];  Service: General;  Laterality: N/A;    SOCIAL HISTORY: History   Social History  . Marital Status: Single    Spouse Name: N/A  . Number of Children: One son, 13   . Years of Education: N/A   Occupational History  . Disabled due to depression, DM    Social History Main Topics  . Smoking status: Current Every Day Smoker -- 0.25 packs/day for 20 years    Types: Cigarettes  . Smokeless tobacco: Never Used     Comment: one every 2-3 days  . Alcohol Use: 0.0 oz/week     Comment: 3 cans beer a week      pt states crack cocaine was 5 or 6 yrs ago   . Drug Use: Yes     Special: "Crack" cocaine     Comment: last time was a year ago   . Sexual Activity: No   Other Topics Concern  . Not on file   Social History Narrative    FAMILY HISTORY: Family History  Problem Relation Age of Onset  . Hypertension Father   . Stroke Father   . Diabetes Father   . Heart disease Mother   . Hypertension Mother   . Hyperlipidemia Mother   . Hypertension Sister   . Hypertension Brother   . Hypertension Sister   . Hypertension Brother   . Cancer Maternal Aunt     cancer, unknown type   . Cancer Maternal Uncle     bone cancer   . Cancer Paternal Aunt     lung cancer  . Cancer Paternal Uncle     lung cancer  . Cancer Maternal Uncle     colon cancer and prostate cancer   . Cancer Maternal Uncle     prostate cancer   . Cancer Other 47    colon cancer     ALLERGIES:  is allergic to penicillins.  MEDICATIONS:  Current Outpatient Prescriptions  Medication Sig Dispense Refill  . albuterol (PROVENTIL HFA;VENTOLIN HFA) 108 (90 BASE) MCG/ACT inhaler Inhale 2 puffs into the lungs every 4 (four) hours as needed for wheezing.    Marland Kitchen amLODipine (NORVASC) 10 MG tablet Take 10 mg by mouth every morning.     . citalopram (CELEXA) 20 MG tablet Take 20 mg by mouth every morning.     . gabapentin (NEURONTIN) 300 MG capsule Take 300 mg by mouth 2 (two) times daily.     . hydrochlorothiazide (MICROZIDE) 12.5 MG capsule Take 12.5 mg by mouth every morning.    Marland Kitchen ibuprofen (ADVIL,MOTRIN) 800 MG tablet Take 1 tablet by mouth as needed.    . metFORMIN (GLUCOPHAGE) 1000 MG tablet Take 500 mg by mouth 2 (two) times daily with a meal.     . pantoprazole (PROTONIX) 40 MG tablet Take 40 mg by mouth daily.    Nelva Nay SOLOSTAR 300 UNIT/ML SOPN Inject 20 Units into the skin at bedtime.  1  . traMADol (ULTRAM) 50 MG tablet Take 50 mg by mouth every 6 (six) hours as needed for pain.      No current facility-administered medications for this visit.    REVIEW OF SYSTEMS:     Constitutional: Denies fevers, chills or abnormal night sweats Eyes: Denies blurriness of vision, double vision or watery eyes Ears, nose, mouth, throat, and face: Denies mucositis or sore throat Respiratory: mild intermittent dry cough, no dyspnea or wheezes Cardiovascular: Denies palpitation, chest discomfort or lower extremity swelling Gastrointestinal:  Denies nausea, heartburn or change in bowel habits Skin: Denies abnormal skin rashes Lymphatics: Denies new lymphadenopathy or easy bruising Neurological:Denies numbness, tingling or new weaknesses Behavioral/Psych: Mood is stable, no new changes  All other systems were reviewed with the patient and are negative.  PHYSICAL EXAMINATION: ECOG PERFORMANCE STATUS: 1 - Symptomatic but completely ambulatory  There were no vitals filed for this visit. There were no vitals filed for this visit.  GENERAL:alert, no distress and comfortable SKIN: skin color, texture, turgor are normal, no rashes or significant lesions EYES: normal, conjunctiva are pink and non-injected, sclera clear OROPHARYNX:no exudate, no erythema and lips, buccal mucosa, and tongue normal  NECK: supple, thyroid normal size, non-tender, without nodularity LYMPH:  no palpable lymphadenopathy in the cervical, axillary or inguinal LUNGS: clear to auscultation and percussion with normal breathing effort HEART: regular rate & rhythm and no murmurs and no lower extremity edema ABDOMEN:abdomen soft, mild tenderness at the low pelvic area, bo rebound pain, and normal bowel sounds Musculoskeletal:no cyanosis of digits and no clubbing. No focal tenderness  PSYCH: alert & oriented x 3 with fluent speech NEURO: no focal motor/sensory deficits  LABORATORY DATA:  I have reviewed the data as listed CBC Latest Ref Rng 04/08/2015 01/12/2015 10/28/2014  WBC 3.9 - 10.3 10e3/uL 3.8(L) 3.6(L) 4.7  Hemoglobin 11.6 - 15.9 g/dL 12.0 12.1 11.9  Hematocrit 34.8 - 46.6 % 34.3(L) 34.1(L) 34.4(L)   Platelets 145 - 400 10e3/uL 196 198 198    Recent Labs  10/28/14 1424 01/12/15 1444 04/08/15 0928  NA 140 135* 134*  K 4.2 3.8 4.4  CO2 _0 GLUCOSE 177* 395* 423*  BUN 22.5 20.7 30.4*  CREATININE 1.2* 1.5* 1.7*  CALCIUM 9.0 9.0 9.2  PROT 7.8 7.8 7.7  ALBUMIN 3.4* 3.4* 3.5  AST _1 ALT _2 ALKPHOS 106 118 181*  BILITOT 1.01 1.41* 0.97     CEA  Status: Finalresult Visible to patient:  Not Released Nextappt: Today at 09:30 AM in [facility], Krista Blue, MD) Dx:  Rectal cancer [facility])              Ref Range 7d ago  5moago  539mogo  42m12moo     CEA 0.0 - 5.0 ng/mL 6.9 (H) 8.1 (H) 7.0 (H) 7.1 (H)         PATHOLOGY REPORT: Diagnosis 02/04/2014 Surgical [P], ascending, transverse, descending, polyp - FRAGMENTS OF TUBULAR ADENOMAS (X3); NEGATIVE FOR HIGH GRADE DYSPLASIA OR MALIGNANCY.  Diagnosis 01/29/2013 1. Colon, segmental resection, rectosigmoid - INVASIVE WELL DIFFERENTIATED ADENOCARCINOMA ARISING IN BACKGROUND OF TUBULOVILLOUS ADENOMA, INVADING INTO THE SUBMUCOSA. - NO EVIDENCE OF ANGIOLYMPHATIC INVASION IDENTIFIED. - EIGHT LYMPH NODES, NEGATIVE FOR METASTATIC CARCINOMA (0/8 ), PLEASE SEE COMMENT - MULTIPLE SERRATED POLYPS/ADENOMA. - RESECTION MARGINS, NEGATIVE FOR ATYPIA OR MALIGNANCY. 2. Colon, resection margin (donut), anastomotic rings - BENIGN COLONIC MUCOSA, NO EVIDENCE OF MALIGNANCY. Microscopic Comment 1. COLON AND RECTUM Specimen: Rectosigmoid colon Procedure: Segmental resection Tumor site: Upper rectum Specimen integrity: Intact Macroscopic intactness of mesorectum: Near complete Macroscopic tumor perforation: No Invasive tumor: Maximum size: 1 cm Histologic type(s): Invasive adenocarcinoma Histologic grade and differentiation: G1: well differentiated/low grade Type of polyp in which invasive carcinoma arose: Tubulovillous adenoma Microscopic extension of invasive tumor: Invading into  submucosa Lymph-Vascular invasion: Not identified Peri-neural invasion: Not identified Tumor deposit(s) (discontinuous extramural extension): No Resection margins: Proximal: 11.2 cm Distal: 2.5 cm Radial: 1.6 cm Treatment effect (neoadjuvant therapy): Not identified Number of lymph nodes examined 6+2=8 ; Number positive 0 Additional polyp(s): Multiple serrated polyps. Non-neoplastic findings: Not identified Pathologic Staging: T1, N0, Mx Ancillary studies: The tumor block was sent to [facility] for MSI testing. Comment: The pericolonic fatty tissue was submitted for clearing process. Six lymph nodes were identified and sections show no evidence of metastatic carcinoma. Per Dr. AliLeighton Ruffequest, ten additional tissue blocks of pericolonic fatty tissue were submitted for microscopic examination. Two tiny small lymph nodes were identified microscopically and show no evidence of metastatic carcinoma. Case was discussed with Dr. ThoMarcello Moores 02-04-2013. RADIOGRAPHIC STUDIES: I have personally reviewed the radiological images as listed and agreed with the findings in the report.  PET 05/14/2015  IMPRESSION: No evidence of recurrent or metastatic carcinoma within the chest, abdomen, or pelvis.  Resolved and/or decreased size of ill-defined bilateral pulmonary nodular opacities, consistent with resolving infectious or inflammatory etiology. Chronic interstitial lung disease again noted.  ASSESSMENT & PLAN:  [age]ar old female, with past medical history of stage I rectal adenocarcinoma in August 2014, status post complete surgical resection. Now presents with a rising CEA.  1. History of rectal adenocarcinoma, stage I, arising CEA, indeterminate lung nodules -she is now 2 years out of her colon cancer surgery. -Her CEA has been mildly elevated, overall stable, it was 6.9 last week -her repeated CT chest from 01/12/2015 showed stable bilateral small lung nodules, some are smaller than last  time, most likely benign changes. -I discussed her PET scan from 05/14/2015, which showed no evidence of recurrent disease. Pulmonary nodules has decreased in size, consistent with benign process. -continue surveillance, she will follow up with lab including CEA every 6 months  2. HTN, DM, depression -Follow up with primary care physician -Her Cr is 1.7 with GFR 37 last week, I am wondering if she needs to come off metformin and just use long and short acting insulin  3. CKD -her Cr has been increasing in the  past 3 month, likely related to uncontrolled DM -will avoid nephrotoxins such as NSAIDs and IV contrast  4. Right finer injury -will obtain a hand x-ray to rule out fracture.  Follow-up: I'll see her back in 6 month with lab one week before   All questions were answered. The patient knows to call the clinic with any problems, questions or concerns. I spent 20 minutes counseling the patient face to face. The total time spent in the appointment was 25 minutes and more than 50% was on counseling.     Truitt Merle, MD 07/29/2015 7:17 AM

## 2015-07-30 LAB — CEA (PARALLEL TESTING): CEA: 5.6 ng/mL

## 2015-07-30 LAB — CEA: CEA: 9.1 ng/mL — ABNORMAL HIGH (ref 0.0–4.7)

## 2015-08-04 ENCOUNTER — Telehealth: Payer: Self-pay | Admitting: *Deleted

## 2015-08-04 DIAGNOSIS — C20 Malignant neoplasm of rectum: Secondary | ICD-10-CM

## 2015-08-04 NOTE — Telephone Encounter (Signed)
-----  Message from Truitt Merle, MD sent at 08/02/2015 11:03 PM EST ----- Erica [name],  Please call pt and let her know lab results. Her CEA was normal, but CMP showed mildly elevated tbil, and I recommend her to repeat lab in 3 months,  please send a POF for lab appointment in 3 month, she is scheduled to see me back in 6 months with lab.  Truitt Merle

## 2015-08-04 NOTE — Progress Notes (Signed)
Done

## 2015-08-05 ENCOUNTER — Telehealth: Payer: Self-pay | Admitting: Hematology

## 2015-08-05 NOTE — Telephone Encounter (Signed)
cld & spoke to pt and gave appt time & date for 5/22 lab appt

## 2015-08-31 ENCOUNTER — Other Ambulatory Visit: Payer: Self-pay

## 2015-08-31 DIAGNOSIS — Z1231 Encounter for screening mammogram for malignant neoplasm of breast: Secondary | ICD-10-CM

## 2015-09-03 ENCOUNTER — Other Ambulatory Visit (HOSPITAL_COMMUNITY): Payer: Self-pay | Admitting: Internal Medicine

## 2015-09-03 ENCOUNTER — Ambulatory Visit (HOSPITAL_COMMUNITY)
Admission: RE | Admit: 2015-09-03 | Discharge: 2015-09-03 | Disposition: A | Discharge status: Home / Self Care | Payer: Commercial Managed Care - HMO | Source: Ambulatory Visit | Attending: Vascular Surgery | Admitting: Vascular Surgery

## 2015-09-03 DIAGNOSIS — M79609 Pain in unspecified limb: Secondary | ICD-10-CM

## 2015-09-03 DIAGNOSIS — M79604 Pain in right leg: Secondary | ICD-10-CM | POA: Insufficient documentation

## 2015-09-03 DIAGNOSIS — M79605 Pain in left leg: Secondary | ICD-10-CM | POA: Diagnosis present

## 2015-09-22 ENCOUNTER — Ambulatory Visit: Payer: Commercial Managed Care - HMO

## 2015-11-02 ENCOUNTER — Other Ambulatory Visit (HOSPITAL_BASED_OUTPATIENT_CLINIC_OR_DEPARTMENT_OTHER): Payer: Commercial Managed Care - HMO

## 2015-11-02 DIAGNOSIS — C20 Malignant neoplasm of rectum: Secondary | ICD-10-CM | POA: Diagnosis not present

## 2015-11-02 LAB — COMPREHENSIVE METABOLIC PANEL
ALT: <9 U/L (ref 0–55)
AST: 11 U/L (ref 5–34)
Albumin: 3.6 g/dL (ref 3.5–5.0)
Alkaline Phosphatase: 110 U/L (ref 40–150)
Anion Gap: 8 mEq/L (ref 3–11)
BUN: 30.6 mg/dL — ABNORMAL HIGH (ref 7.0–26.0)
CO2: 19 mEq/L — ABNORMAL LOW (ref 22–29)
Calcium: 8.9 mg/dL (ref 8.4–10.4)
Chloride: 108 mEq/L (ref 98–109)
Creatinine: 2 mg/dL — ABNORMAL HIGH (ref 0.6–1.1)
EGFR: 30 mL/min/{1.73_m2} — ABNORMAL LOW (ref >90)
Glucose: 280 mg/dl — ABNORMAL HIGH (ref 70–140)
Potassium: 4.2 mEq/L (ref 3.5–5.1)
Sodium: 135 mEq/L — ABNORMAL LOW (ref 136–145)
Total Bilirubin: 1.45 mg/dL — ABNORMAL HIGH (ref 0.20–1.20)
Total Protein: 8 g/dL (ref 6.4–8.3)

## 2015-11-02 LAB — CBC WITH DIFFERENTIAL/PLATELET
BASO%: 0 % (ref 0.0–2.0)
Basophils Absolute: 0 10*3/uL (ref 0.0–0.1)
EOS%: 1 % (ref 0.0–7.0)
Eosinophils Absolute: 0 10*3/uL (ref 0.0–0.5)
HCT: 33.2 % — ABNORMAL LOW (ref 34.8–46.6)
HGB: 11.9 g/dL (ref 11.6–15.9)
LYMPH%: 25.5 % (ref 14.0–49.7)
MCH: 33.1 pg (ref 25.1–34.0)
MCHC: 35.8 g/dL (ref 31.5–36.0)
MCV: 92.5 fL (ref 79.5–101.0)
MONO#: 0.3 10*3/uL (ref 0.1–0.9)
MONO%: 6.6 % (ref 0.0–14.0)
NEUT#: 2.7 10*3/uL (ref 1.5–6.5)
NEUT%: 66.9 % (ref 38.4–76.8)
Platelets: 169 10*3/uL (ref 145–400)
RBC: 3.59 10*6/uL — ABNORMAL LOW (ref 3.70–5.45)
RDW: 16.3 % — ABNORMAL HIGH (ref 11.2–14.5)
WBC: 4.1 10*3/uL (ref 3.9–10.3)
lymph#: 1 10*3/uL (ref 0.9–3.3)

## 2015-11-03 LAB — CEA: CEA: 8.3 ng/mL — ABNORMAL HIGH (ref 0.0–4.7)

## 2015-11-03 LAB — CEA (PARALLEL TESTING): CEA: 5.8 ng/mL — ABNORMAL HIGH

## 2016-01-26 ENCOUNTER — Other Ambulatory Visit (HOSPITAL_BASED_OUTPATIENT_CLINIC_OR_DEPARTMENT_OTHER): Payer: Medicare HMO

## 2016-01-26 DIAGNOSIS — C20 Malignant neoplasm of rectum: Secondary | ICD-10-CM | POA: Diagnosis not present

## 2016-01-26 LAB — CBC WITH DIFFERENTIAL/PLATELET
BASO%: 0.9 % (ref 0.0–2.0)
Basophils Absolute: 0 10*3/uL (ref 0.0–0.1)
EOS%: 2.7 % (ref 0.0–7.0)
Eosinophils Absolute: 0.1 10*3/uL (ref 0.0–0.5)
HCT: 35.4 % (ref 34.8–46.6)
HGB: 12.1 g/dL (ref 11.6–15.9)
LYMPH%: 32.1 % (ref 14.0–49.7)
MCH: 33.2 pg (ref 25.1–34.0)
MCHC: 34.3 g/dL (ref 31.5–36.0)
MCV: 96.9 fL (ref 79.5–101.0)
MONO#: 0.3 10*3/uL (ref 0.1–0.9)
MONO%: 8.7 % (ref 0.0–14.0)
NEUT#: 1.8 10*3/uL (ref 1.5–6.5)
NEUT%: 55.6 % (ref 38.4–76.8)
Platelets: 193 10*3/uL (ref 145–400)
RBC: 3.65 10*6/uL — ABNORMAL LOW (ref 3.70–5.45)
RDW: 15.6 % — ABNORMAL HIGH (ref 11.2–14.5)
WBC: 3.3 10*3/uL — ABNORMAL LOW (ref 3.9–10.3)
lymph#: 1.1 10*3/uL (ref 0.9–3.3)

## 2016-01-26 LAB — CEA (IN HOUSE-CHCC): CEA (CHCC-In House): 6.34 ng/mL — ABNORMAL HIGH (ref 0.00–5.00)

## 2016-01-26 LAB — COMPREHENSIVE METABOLIC PANEL
ALT: <9 U/L (ref 0–55)
AST: 12 U/L (ref 5–34)
Albumin: 3.4 g/dL — ABNORMAL LOW (ref 3.5–5.0)
Alkaline Phosphatase: 102 U/L (ref 40–150)
Anion Gap: 8 mEq/L (ref 3–11)
BUN: 31.8 mg/dL — ABNORMAL HIGH (ref 7.0–26.0)
CO2: 20 mEq/L — ABNORMAL LOW (ref 22–29)
Calcium: 8.8 mg/dL (ref 8.4–10.4)
Chloride: 108 mEq/L (ref 98–109)
Creatinine: 1.9 mg/dL — ABNORMAL HIGH (ref 0.6–1.1)
EGFR: 31 mL/min/{1.73_m2} — ABNORMAL LOW (ref >90)
Glucose: 174 mg/dl — ABNORMAL HIGH (ref 70–140)
Potassium: 4.2 mEq/L (ref 3.5–5.1)
Sodium: 137 mEq/L (ref 136–145)
Total Bilirubin: 1.07 mg/dL (ref 0.20–1.20)
Total Protein: 7.6 g/dL (ref 6.4–8.3)

## 2016-01-27 LAB — CEA: CEA: 6.8 ng/mL — ABNORMAL HIGH (ref 0.0–4.7)

## 2016-02-02 ENCOUNTER — Ambulatory Visit (HOSPITAL_BASED_OUTPATIENT_CLINIC_OR_DEPARTMENT_OTHER): Payer: Medicare HMO | Admitting: Hematology

## 2016-02-02 ENCOUNTER — Encounter: Payer: Self-pay | Admitting: Hematology

## 2016-02-02 ENCOUNTER — Telehealth: Payer: Self-pay | Admitting: Hematology

## 2016-02-02 VITALS — BP 127/83 | HR 59 | Temp 97.5°F | Resp 18 | Ht 63.0 in | Wt 174.1 lb

## 2016-02-02 DIAGNOSIS — G8929 Other chronic pain: Secondary | ICD-10-CM | POA: Diagnosis not present

## 2016-02-02 DIAGNOSIS — Z85038 Personal history of other malignant neoplasm of large intestine: Secondary | ICD-10-CM | POA: Diagnosis not present

## 2016-02-02 DIAGNOSIS — M545 Low back pain: Secondary | ICD-10-CM

## 2016-02-02 DIAGNOSIS — I10 Essential (primary) hypertension: Secondary | ICD-10-CM

## 2016-02-02 DIAGNOSIS — N189 Chronic kidney disease, unspecified: Secondary | ICD-10-CM | POA: Diagnosis not present

## 2016-02-02 DIAGNOSIS — F329 Major depressive disorder, single episode, unspecified: Secondary | ICD-10-CM

## 2016-02-02 DIAGNOSIS — R918 Other nonspecific abnormal finding of lung field: Secondary | ICD-10-CM

## 2016-02-02 DIAGNOSIS — C20 Malignant neoplasm of rectum: Secondary | ICD-10-CM

## 2016-02-02 DIAGNOSIS — E119 Type 2 diabetes mellitus without complications: Secondary | ICD-10-CM

## 2016-02-02 DIAGNOSIS — Z720 Tobacco use: Secondary | ICD-10-CM

## 2016-02-02 NOTE — Progress Notes (Signed)
[place]  [phone]) [phone] [phone]) [phone]  Clinic follow up Note   Patient Care Team: Nolene Ebbs, MD as PCP - General (Internal Medicine) Truitt Merle, MD as Consulting Physician (Hematology) Leighton Ruff, MD as Consulting Physician (General Surgery) Arta Silence, MD as Consulting Physician (Gastroenterology) 02/02/2016  CHIEF COMPLAINTS:  History of rectal cancer, elevated CEA    Oncology History   Rectal cancer   Staging form: Colon and Rectum, AJCC 7th Edition     Pathologic: Stage I (T1, N0, cM0) - Signed by Truitt Merle, MD on 09/26/2014       Rectal cancer ([place])   09/13/2012 Procedure    Colonoscopy & endorectal ultrasound-Dr. Outlaw=Distal colonic polyp      11/14/2012 Procedure    Sigmoidoscopy & Laparoscopy=Dr. Donne Hazel      01/29/2013 Pathologic Stage    pT1N0cM0, stage I, well-differentiated adenocarcinoma arising in background of tubular villous adenoma, invading into the submucosa. No evidence of angiolymphatic invasion. 18 lymph nodes were negative.      01/29/2013 Surgery    Rectosigmoid segmental resection by Dr. Marcello Moores. Surgical margin was negative.      02/04/2013 Initial Diagnosis    Rectal cancer      03/11/2014 Imaging    CT C/A/P=No acute abdominal/pelvic findings, mass lesions or adenopathy. No findings to suggest recurrent rectal cancer and no adenopathy or metastatic disease. 2. Emphysematous changes. 3. Suspect early interstitial lung disease. 4. Tortuous and mil      09/12/2014 Tumor Marker    CEA=6.4      10/14/2014 Imaging    PET showed new and enlarging vague pulmonary nodules bilaterally worrisome for metastatic disease, no other evidence of cancer recurrence       12/09/2014 Tumor Marker    7.5      01/12/2015 Imaging    CT chest showed no significant change in small bilateral subcentimeter solid pulmonary nodules, which are likely benign. Chronic interstitial lung disease.      01/12/2015 Tumor Marker   CEA 8.1       HISTORY OF PRESENTING ILLNESS:  Erica [name] 60 y.o. female is here because of her history of stage I rectal cancer and recent abnormal CEA. She presents with her sister to the clinic today.   Her rectal cancer was diagnosed in August 2014 by screening colonoscopy. She was found to have multiple colon polyps in April 2014, which were removed by colonoscopy and pathology showedtubular or tubulovillous adenoma, and a rectal polyp biopsy showed high-grade dysplasia. She underwent LAR on 01/29/2013, surgical pathology showed invasive well-differentiated adenocarcinoma, pT1N0, 8 lymph nodes were negative. She has been followed by Dr. Marcello Moores since then. Her last CTs scan was in September 2015 which was negative for disease recurrence, and colonoscopy on 02/04/2014 showed no evidence of disease recurrence, except 3 colon polyps which were removed. Her CEA however on 09/12/2014 was elevated at 6.4. She was referred by Dr. Marcello Moores for further evaluation.  She has some residual low abdominal pain since the surgery, she has loose BM 2-4 times daily, no blood, appetite and energy level is good, no significant change lately, except low back pain for the past 6 month, which is 6/10, no radiation pain. No recently weight loss.   CURRENT THERAPY: Surveillance  INTERIM HISTORY: Jamilia returns for follow-up. She was last seen by me 6 months ago. She is doing well overall, she has chronic back pain, which has been worse in the past year. She is seen primary care physician  for that, and takes pain medication as needed. She is able to function well. She denies any other pain, no abdominal discomfort, bloating, nausea, or change of her bowel habits. Her weight is stable. No other new complaints.  MEDICAL HISTORY:  Past Medical History:  Diagnosis Date  . Arthritis    especially in shoulders  . Asthma   . Depression   . Diabetes mellitus   . GERD (gastroesophageal reflux disease)   . Headache(784.0)      "mild"  . Hypertension   . Mental disorder    depression  . Neuropathy ([facility])    feet   . Pain    arthritis pain - takes tramadol as needed  . Rectal polyp    very little bleeding with bowel movements- no pain    SURGICAL HISTORY: Past Surgical History:  Procedure Laterality Date  . CESAREAN SECTION    . EUS N/A 11/21/2012   Procedure: LOWER ENDOSCOPIC ULTRASOUND (EUS);  Surgeon: Arta Silence, MD;  Location: [facility];  Service: Endoscopy;  Laterality: N/A;  . FLEXIBLE SIGMOIDOSCOPY N/A 11/21/2012   Procedure: FLEXIBLE SIGMOIDOSCOPY;  Surgeon: Arta Silence, MD;  Location: [facility];  Service: Endoscopy;  Laterality: N/A;  . FLEXIBLE SIGMOIDOSCOPY N/A 01/28/2013   Procedure: FLEXIBLE SIGMOIDOSCOPY;  Surgeon: Leighton Ruff, MD;  Location: [facility];  Service: Endoscopy;  Laterality: N/A;  . LAPAROSCOPIC LOW ANTERIOR RESECTION N/A 01/29/2013   Procedure: LAPAROSCOPIC LOW ANTERIOR RESECTION, Rigid Proctoscopy;  Surgeon: Leighton Ruff, MD;  Location: [facility];  Service: General;  Laterality: N/A;  . LAPAROSCOPIC SIGMOID COLECTOMY N/A 11/14/2012   Procedure: DIAGNOSTIC LAPAROSCOPY AND SIGMOIDMOIDOSCOPY ;  Surgeon: Rolm Bookbinder, MD;  Location: [facility];  Service: General;  Laterality: N/A;  . TONSILLECTOMY    . TONSILLECTOMY AND ADENOIDECTOMY      SOCIAL HISTORY: History   Social History  . Marital Status: Single    Spouse Name: N/A  . Number of Children: One son, 14   . Years of Education: N/A   Occupational History  . Disabled due to depression, DM    Social History Main Topics  . Smoking status: Current Every Day Smoker -- 0.25 packs/day for 20 years    Types: Cigarettes  . Smokeless tobacco: Never Used     Comment: one every 2-3 days  . Alcohol Use: 0.0 oz/week     Comment: 3 cans beer a week      pt states crack cocaine was 5 or 6 yrs ago   . Drug Use: Yes    Special: "Crack" cocaine     Comment: last time was a year ago   . Sexual Activity: No   Other  Topics Concern  . Not on file   Social History Narrative    FAMILY HISTORY: Family History  Problem Relation Age of Onset  . Hypertension Father   . Stroke Father   . Diabetes Father   . Heart disease Mother   . Hypertension Mother   . Hyperlipidemia Mother   . Hypertension Sister   . Hypertension Brother   . Hypertension Sister   . Hypertension Brother   . Cancer Maternal Aunt     cancer, unknown type   . Cancer Maternal Uncle     bone cancer   . Cancer Paternal Aunt     lung cancer  . Cancer Paternal Uncle     lung cancer  . Cancer Maternal Uncle     colon cancer and prostate cancer   . Cancer Maternal  Uncle     prostate cancer   . Cancer Other 47    colon cancer     ALLERGIES:  is allergic to penicillins.  MEDICATIONS:  Current Outpatient Prescriptions  Medication Sig Dispense Refill  . albuterol (PROVENTIL HFA;VENTOLIN HFA) 108 (90 BASE) MCG/ACT inhaler Inhale 2 puffs into the lungs every 4 (four) hours as needed for wheezing.    . canagliflozin (INVOKANA) 300 MG TABS tablet Take 300 mg by mouth daily before breakfast.    . citalopram (CELEXA) 20 MG tablet Take 20 mg by mouth every morning.     . gabapentin (NEURONTIN) 300 MG capsule Take 100 mg by mouth 3 (three) times daily.     Marland Kitchen ibuprofen (ADVIL,MOTRIN) 800 MG tablet Take 1 tablet by mouth as needed.    Marland Kitchen lisinopril-hydrochlorothiazide (PRINZIDE,ZESTORETIC) 10-12.5 MG tablet Take 1 tablet by mouth daily.  5  . methocarbamol (ROBAXIN) 500 MG tablet Reported on 07/29/2015    . pantoprazole (PROTONIX) 40 MG tablet Take 40 mg by mouth daily.    . simvastatin (ZOCOR) 10 MG tablet TK 1 T PO D HS  5  . TOUJEO SOLOSTAR 300 UNIT/ML SOPN Inject 30 Units into the skin at bedtime.   1  . traMADol (ULTRAM) 50 MG tablet Take 50 mg by mouth every 6 (six) hours as needed for pain.      No current facility-administered medications for this visit.     REVIEW OF SYSTEMS:   Constitutional: Denies fevers, chills or abnormal  night sweats Eyes: Denies blurriness of vision, double vision or watery eyes Ears, nose, mouth, throat, and face: Denies mucositis or sore throat Respiratory: mild intermittent dry cough, no dyspnea or wheezes Cardiovascular: Denies palpitation, chest discomfort or lower extremity swelling Gastrointestinal:  Denies nausea, heartburn or change in bowel habits Skin: Denies abnormal skin rashes Lymphatics: Denies new lymphadenopathy or easy bruising Neurological:Denies numbness, tingling or new weaknesses Behavioral/Psych: Mood is stable, no new changes  All other systems were reviewed with the patient and are negative.  PHYSICAL EXAMINATION: ECOG PERFORMANCE STATUS: 1 - Symptomatic but completely ambulatory  Vitals:   02/02/16 1044  BP: 127/83  Pulse: (!) 59  Resp: 18  Temp: 97.5 F (36.4 C)   Filed Weights   02/02/16 1044  Weight: 174 lb 1.6 oz (79 kg)    GENERAL:alert, no distress and comfortable SKIN: skin color, texture, turgor are normal, no rashes or significant lesions EYES: normal, conjunctiva are pink and non-injected, sclera clear OROPHARYNX:no exudate, no erythema and lips, buccal mucosa, and tongue normal  NECK: supple, thyroid normal size, non-tender, without nodularity LYMPH:  no palpable lymphadenopathy in the cervical, axillary or inguinal LUNGS: clear to auscultation and percussion with normal breathing effort HEART: regular rate & rhythm and no murmurs and no lower extremity edema ABDOMEN:abdomen soft, mild tenderness at the low pelvic area, bo rebound pain, and normal bowel sounds Musculoskeletal:no cyanosis of digits and no clubbing. No focal tenderness  PSYCH: alert & oriented x 3 with fluent speech NEURO: no focal motor/sensory deficits  LABORATORY DATA:  I have reviewed the data as listed CBC Latest Ref Rng & Units 01/26/2016 11/02/2015 07/29/2015  WBC 3.9 - 10.3 10e3/uL 3.3(L) 4.1 6.5  Hemoglobin 11.6 - 15.9 g/dL 12.1 11.9 12.9  Hematocrit 34.8 - 46.6  % 35.4 33.2(L) 38.0  Platelets 145 - 400 10e3/uL 193 169 186    Recent Labs  07/29/15 1206 11/02/15 1017 01/26/16 0942  NA 139 135* 137  K 4.6 4.2 4.2  CO2 23 19* 20*  GLUCOSE 264* 280* 174*  BUN 25.3 30.6* 31.8*  CREATININE 1.6* 2.0* 1.9*  CALCIUM 9.5 8.9 8.8  PROT 8.9* 8.0 7.6  ALBUMIN 3.9 3.6 3.4*  AST _0 ALT 11 <9 <9  ALKPHOS 122 110 102  BILITOT 1.49* 1.45* 1.07    Results for DARRIONA, DEHAAS (MRN [id number]) as of 02/02/2016 11:14  Ref. Range 04/08/2015 09:28 07/29/2015 12:06 11/02/2015 10:17 01/26/2016 09:42  CEA Latest Units: ng/mL 6.9 (H) 5.6 5.8 (H)   CEA Latest Ref Range: 0.0 - 4.7 ng/mL  9.1 (H) 8.3 (H) 6.8 (H)  CEA ([facility]) Latest Ref Range: 0.00 - 5.00 ng/mL    6.34 (H)    PATHOLOGY REPORT: Diagnosis 02/04/2014 Surgical [P], ascending, transverse, descending, polyp - FRAGMENTS OF TUBULAR ADENOMAS (X3); NEGATIVE FOR HIGH GRADE DYSPLASIA OR MALIGNANCY.  Diagnosis 01/29/2013 1. Colon, segmental resection, rectosigmoid - INVASIVE WELL DIFFERENTIATED ADENOCARCINOMA ARISING IN BACKGROUND OF TUBULOVILLOUS ADENOMA, INVADING INTO THE SUBMUCOSA. - NO EVIDENCE OF ANGIOLYMPHATIC INVASION IDENTIFIED. - EIGHT LYMPH NODES, NEGATIVE FOR METASTATIC CARCINOMA (0/8 ), PLEASE SEE COMMENT - MULTIPLE SERRATED POLYPS/ADENOMA. - RESECTION MARGINS, NEGATIVE FOR ATYPIA OR MALIGNANCY. 2. Colon, resection margin (donut), anastomotic rings - BENIGN COLONIC MUCOSA, NO EVIDENCE OF MALIGNANCY. Microscopic Comment 1. COLON AND RECTUM Specimen: Rectosigmoid colon Procedure: Segmental resection Tumor site: Upper rectum Specimen integrity: Intact Macroscopic intactness of mesorectum: Near complete Macroscopic tumor perforation: No Invasive tumor: Maximum size: 1 cm Histologic type(s): Invasive adenocarcinoma Histologic grade and differentiation: G1: well differentiated/low grade Type of polyp in which invasive carcinoma arose: Tubulovillous adenoma Microscopic extension of  invasive tumor: Invading into submucosa Lymph-Vascular invasion: Not identified Peri-neural invasion: Not identified Tumor deposit(s) (discontinuous extramural extension): No Resection margins: Proximal: 11.2 cm Distal: 2.5 cm Radial: 1.6 cm Treatment effect (neoadjuvant therapy): Not identified Number of lymph nodes examined 6+2=8 ; Number positive 0 Additional polyp(s): Multiple serrated polyps. Non-neoplastic findings: Not identified Pathologic Staging: T1, N0, Mx Ancillary studies: The tumor block was sent to [facility] for MSI testing. Comment: The pericolonic fatty tissue was submitted for clearing process. Six lymph nodes were identified and sections show no evidence of metastatic carcinoma. Per Dr. Leighton Ruff' request, ten additional tissue blocks of pericolonic fatty tissue were submitted for microscopic examination. Two tiny small lymph nodes were identified microscopically and show no evidence of metastatic carcinoma. Case was discussed with Dr. Marcello Moores on 02-04-2013. RADIOGRAPHIC STUDIES: I have personally reviewed the radiological images as listed and agreed with the findings in the report.  PET 05/14/2015  IMPRESSION: No evidence of recurrent or metastatic carcinoma within the chest, abdomen, or pelvis.  Resolved and/or decreased size of ill-defined bilateral pulmonary nodular opacities, consistent with resolving infectious or inflammatory etiology. Chronic interstitial lung disease again noted.  ASSESSMENT & PLAN:  60 year old female, with past medical history of stage I rectal adenocarcinoma in August 2014, status post complete surgical resection. Now presents with a rising CEA.  1. History of rectal adenocarcinoma, stage I, arising CEA, indeterminate lung nodules -she is now 3 years out of her colon cancer surgery. -We discussed that risk of cancer recurrence after 3 years will further decrease -Her CEA has been mildly elevated, overall stable, it was 6.8 last  week, slightly lower than before. -her repeated CT chest from 01/12/2015 showed stable bilateral small lung nodules, some are smaller than last time, most likely benign changes. -I discussed her PET scan from 05/14/2015, which showed no evidence of recurrent disease. Pulmonary nodules has  decreased in size, consistent with benign process. -continue surveillance, she will follow up with lab including CEA every 6 months, plan to repeat CT scan without contrast in 6 months.  2. HTN, DM, depression -Follow up with primary care physician -Her medication has been adjusted recently.  3. CKD -her Cr was 1.9 last week, overall stable -will avoid nephrotoxins such as NSAIDs and IV contrast  4. Worsening back pain -She has chronic low back pain for several years, worse in the past year -I encouraged her to follow-up with her primary care physician, to see if she needs to see orthopedic surgeon  Follow-up: I'll see her back in 6 month with lab and CT chest, abdomen pelvis without contrast one week before   All questions were answered. The patient knows to call the clinic with any problems, questions or concerns. I spent 20 minutes counseling the patient face to face. The total time spent in the appointment was 25 minutes and more than 50% was on counseling.     Truitt Merle, MD 02/02/2016 11:12 AM

## 2016-02-02 NOTE — Telephone Encounter (Signed)
Gave patient avs report and appointments for February. [facility] will call re scan - patient aware.

## 2016-02-06 ENCOUNTER — Other Ambulatory Visit: Payer: Self-pay | Admitting: Internal Medicine

## 2016-02-06 DIAGNOSIS — E2839 Other primary ovarian failure: Secondary | ICD-10-CM

## 2016-06-02 ENCOUNTER — Encounter (HOSPITAL_COMMUNITY): Payer: Self-pay | Admitting: Emergency Medicine

## 2016-06-02 ENCOUNTER — Emergency Department (HOSPITAL_COMMUNITY): Payer: Medicare HMO

## 2016-06-02 ENCOUNTER — Emergency Department (HOSPITAL_COMMUNITY)
Admission: EM | Admit: 2016-06-02 | Discharge: 2016-06-02 | Disposition: A | Discharge status: Home / Self Care | Payer: Medicare HMO | Attending: Emergency Medicine | Admitting: Emergency Medicine

## 2016-06-02 DIAGNOSIS — J45909 Unspecified asthma, uncomplicated: Secondary | ICD-10-CM | POA: Insufficient documentation

## 2016-06-02 DIAGNOSIS — E1140 Type 2 diabetes mellitus with diabetic neuropathy, unspecified: Secondary | ICD-10-CM | POA: Insufficient documentation

## 2016-06-02 DIAGNOSIS — Z79899 Other long term (current) drug therapy: Secondary | ICD-10-CM | POA: Diagnosis not present

## 2016-06-02 DIAGNOSIS — R0789 Other chest pain: Secondary | ICD-10-CM

## 2016-06-02 DIAGNOSIS — F17210 Nicotine dependence, cigarettes, uncomplicated: Secondary | ICD-10-CM | POA: Insufficient documentation

## 2016-06-02 DIAGNOSIS — I10 Essential (primary) hypertension: Secondary | ICD-10-CM | POA: Diagnosis not present

## 2016-06-02 DIAGNOSIS — R079 Chest pain, unspecified: Secondary | ICD-10-CM | POA: Diagnosis present

## 2016-06-02 DIAGNOSIS — J0110 Acute frontal sinusitis, unspecified: Secondary | ICD-10-CM | POA: Diagnosis not present

## 2016-06-02 DIAGNOSIS — Z85048 Personal history of other malignant neoplasm of rectum, rectosigmoid junction, and anus: Secondary | ICD-10-CM | POA: Insufficient documentation

## 2016-06-02 LAB — I-STAT TROPONIN, ED
Troponin i, poc: 0 ng/mL (ref 0.00–0.08)
Troponin i, poc: 0 ng/mL (ref 0.00–0.08)

## 2016-06-02 LAB — CBC WITH DIFFERENTIAL/PLATELET
Basophils Absolute: 0 10*3/uL (ref 0.0–0.1)
Basophils Relative: 0 %
Eosinophils Absolute: 0.1 10*3/uL (ref 0.0–0.7)
Eosinophils Relative: 1 %
HCT: 32.6 % — ABNORMAL LOW (ref 36.0–46.0)
Hemoglobin: 12 g/dL (ref 12.0–15.0)
Lymphocytes Relative: 19 %
Lymphs Abs: 1.1 10*3/uL (ref 0.7–4.0)
MCH: 32.8 pg (ref 26.0–34.0)
MCHC: 36.8 g/dL — ABNORMAL HIGH (ref 30.0–36.0)
MCV: 89.3 fL (ref 78.0–100.0)
Monocytes Absolute: 0.4 10*3/uL (ref 0.1–1.0)
Monocytes Relative: 7 %
Neutro Abs: 4 10*3/uL (ref 1.7–7.7)
Neutrophils Relative %: 73 %
Platelets: 212 10*3/uL (ref 150–400)
RBC: 3.65 MIL/uL — ABNORMAL LOW (ref 3.87–5.11)
RDW: 15.4 % (ref 11.5–15.5)
WBC: 5.5 10*3/uL (ref 4.0–10.5)

## 2016-06-02 LAB — COMPREHENSIVE METABOLIC PANEL
ALT: 13 U/L — ABNORMAL LOW (ref 14–54)
AST: 20 U/L (ref 15–41)
Albumin: 3.7 g/dL (ref 3.5–5.0)
Alkaline Phosphatase: 94 U/L (ref 38–126)
Anion gap: 10 (ref 5–15)
BUN: 15 mg/dL (ref 6–20)
CO2: 22 mmol/L (ref 22–32)
Calcium: 9.2 mg/dL (ref 8.9–10.3)
Chloride: 106 mmol/L (ref 101–111)
Creatinine, Ser: 1.3 mg/dL — ABNORMAL HIGH (ref 0.44–1.00)
GFR calc Af Amer: 50 mL/min — ABNORMAL LOW (ref >60)
GFR calc non Af Amer: 43 mL/min — ABNORMAL LOW (ref >60)
Glucose, Bld: 136 mg/dL — ABNORMAL HIGH (ref 65–99)
Potassium: 4.1 mmol/L (ref 3.5–5.1)
Sodium: 138 mmol/L (ref 135–145)
Total Bilirubin: 1 mg/dL (ref 0.3–1.2)
Total Protein: 8.5 g/dL — ABNORMAL HIGH (ref 6.5–8.1)

## 2016-06-02 IMAGING — DX DG CHEST 2V
2 series · 2 of 2 positions shown · non-contrast
Comparison: Chest radiographs [DATE], chest CT [DATE] and
PET-CT [DATE].

CLINICAL DATA: Central chest pain for 1 or 2 weeks. Cough. Rectal
carcinoma.

EXAM:
CHEST  2 VIEW

[chest pa]
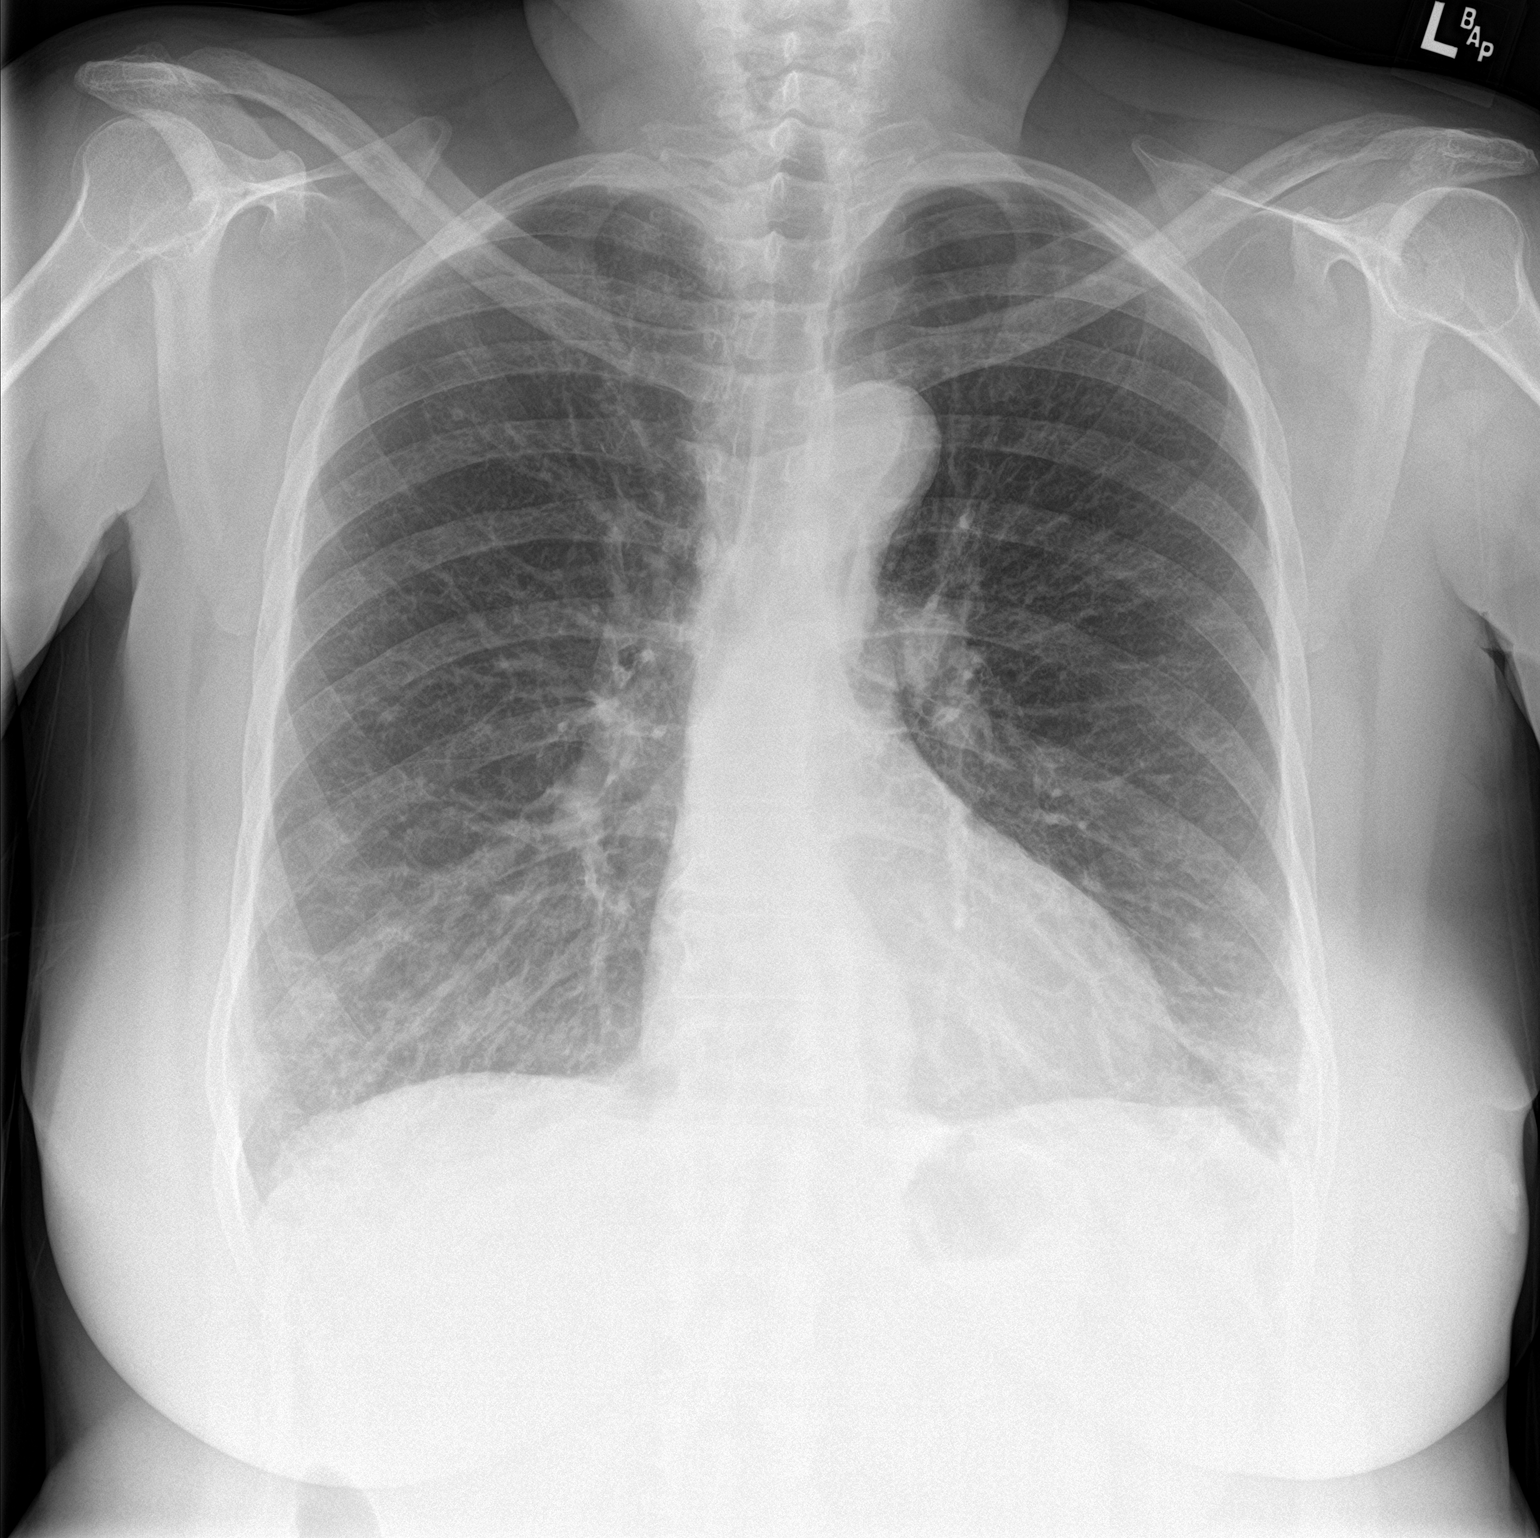

[chest lat]
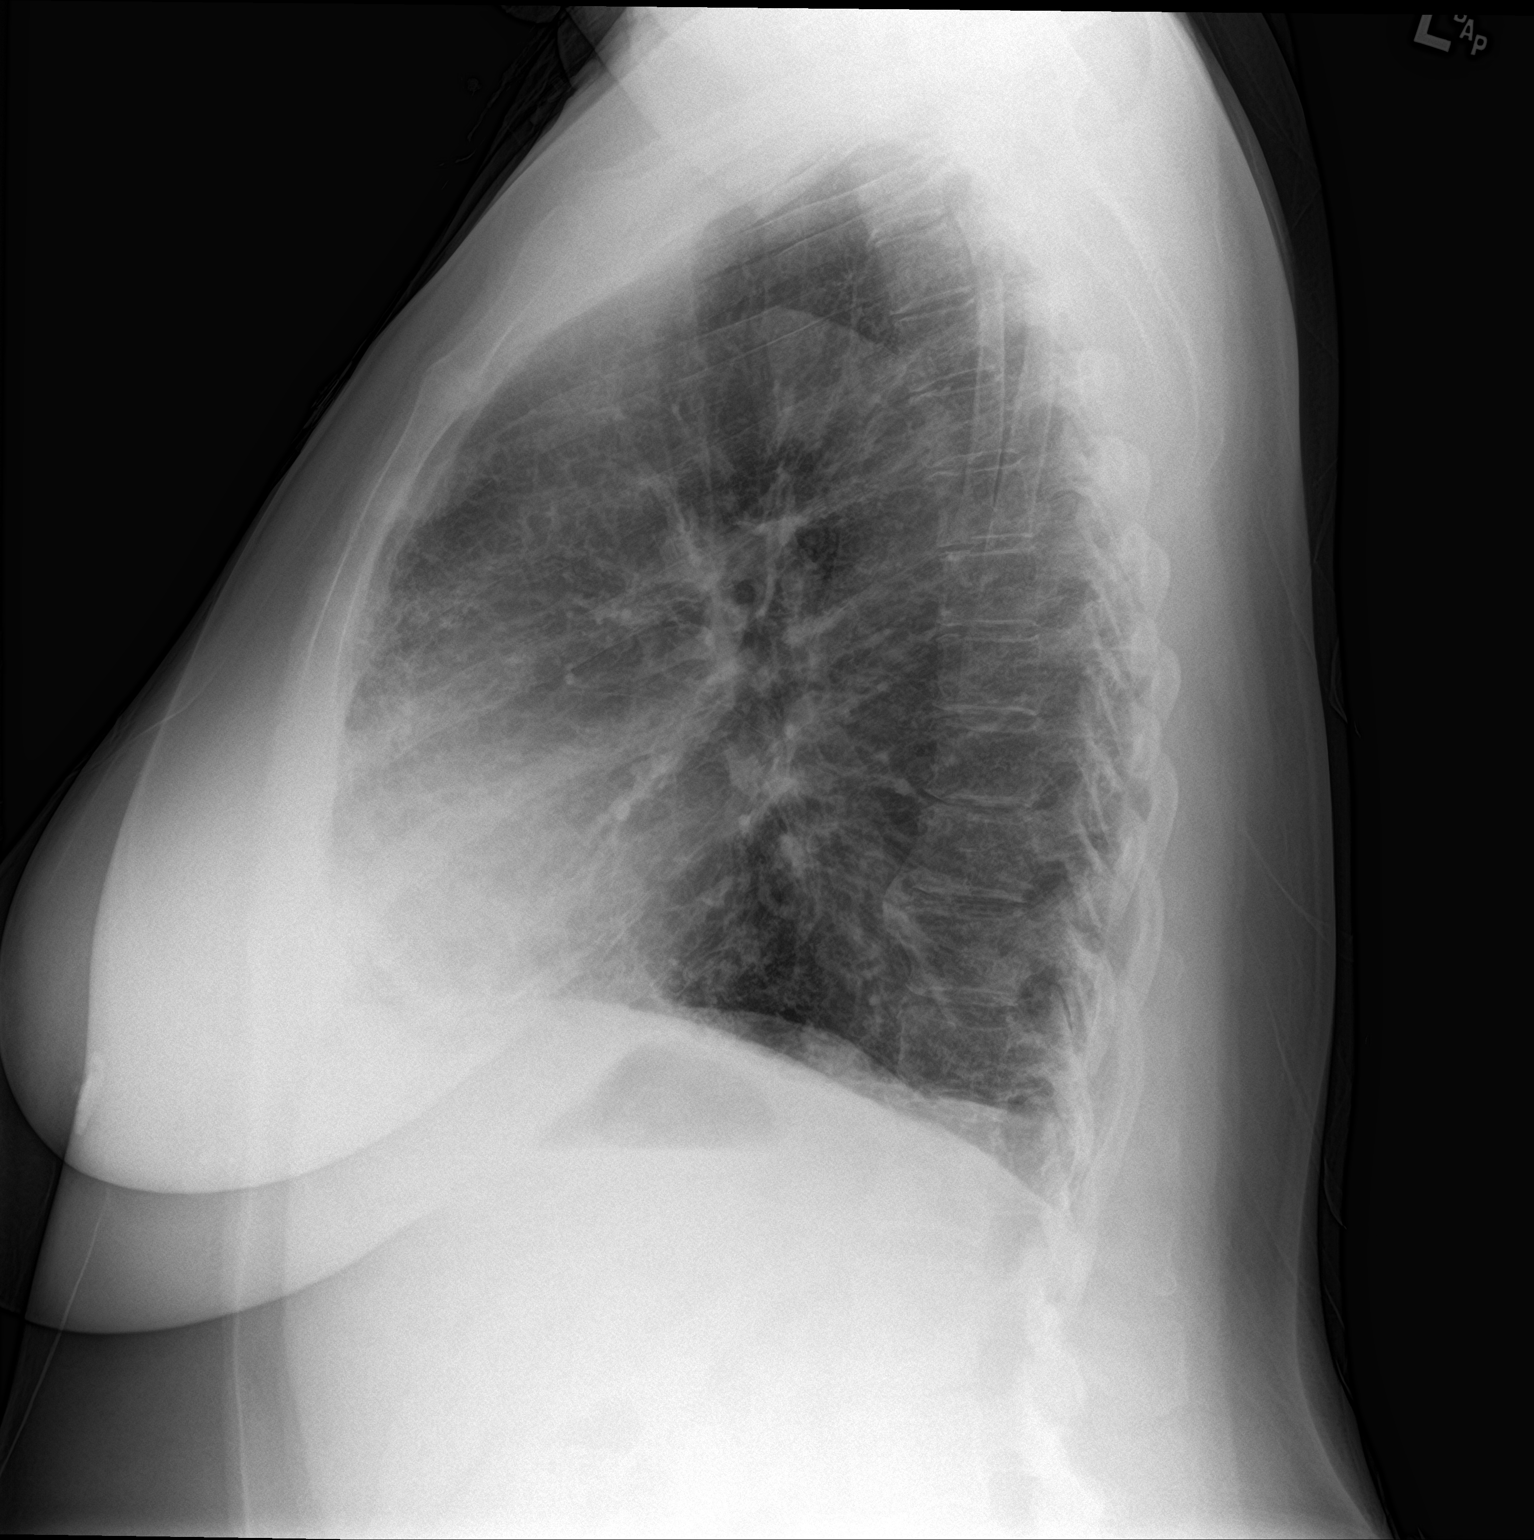

[2 of 2 positions shown; findings below may reference images not displayed]

FINDINGS: The heart size and mediastinal contours are stable. There is mild
interstitial prominence at both lung bases which appears mildly
progressive on the lateral view. No edema, confluent airspace
opacity, suspicious pulmonary nodule or pleural effusion identified.
The bones appear unchanged.
IMPRESSION: Suspected increased basilar interstitial lung disease, suspicious
for pulmonary fibrosis. No acute findings identified.

## 2016-06-02 MED ORDER — SALINE SPRAY 0.65 % NA SOLN: 1.0000 | NASAL | 0 refills | Status: DC | PRN

## 2016-06-02 MED ORDER — DOXYCYCLINE HYCLATE 100 MG PO CAPS: 100.0000 mg | ORAL_CAPSULE | Freq: Two times a day (BID) | ORAL | 0 refills | Status: DC

## 2016-06-02 MED ORDER — FLUTICASONE PROPIONATE 50 MCG/ACT NA SUSP: 2.0000 | Freq: Every day | NASAL | 2 refills | Status: DC

## 2016-06-02 MED ORDER — ONDANSETRON HCL 4 MG/2ML IJ SOLN
4.0000 mg | Freq: Once | INTRAMUSCULAR | Status: AC
  Administered 2016-06-02: 4 mg via INTRAVENOUS
  Filled 2016-06-02: qty 2

## 2016-06-02 MED ORDER — KETOROLAC TROMETHAMINE 30 MG/ML IJ SOLN
30.0000 mg | Freq: Once | INTRAMUSCULAR | Status: AC
  Administered 2016-06-02: 30 mg via INTRAVENOUS
  Filled 2016-06-02: qty 1

## 2016-06-02 MED ORDER — SODIUM CHLORIDE 0.9 % IV BOLUS (SEPSIS)
1000.0000 mL | Freq: Once | INTRAVENOUS | Status: AC
  Administered 2016-06-02: 1000 mL via INTRAVENOUS

## 2016-06-02 MED ORDER — PREDNISONE 10 MG PO TABS: 20.0000 mg | ORAL_TABLET | Freq: Every day | ORAL | 0 refills | Status: AC

## 2016-06-02 NOTE — ED Provider Notes (Signed)
Emergency Department Provider Note   I have reviewed the triage vital signs and the nursing notes.   HISTORY  Chief Complaint Chest Pain   [name] is a 60 y.o. female with past medical history significant for hypertension, diabetes and rectal cancer came to ED with complaint of persistent cough with cloudy colored mucus, sinus congestion, postnasal drip, chest pain and pain at roof of her mouth for 1-1/2 month. She saw her PCP with the same complaint one and a half month ago, she was given a course of antibiotic, with no relief. She states that her sinus pain and chest pain is getting worse. She developed pain in her upper palate for the last 2 weeks. Her chest pain is associated with coughing, she denies any worsening of pain with deep breathing. She denies any fever or chills. She also complained of headache, denies any change in vision. Also complained of mild nausea and vomiting since yesterday. She also complained of chronic abdominal pain and occasional diarrhea. No recent change in her abdominal pain or diarrhea.   Past Medical History:  Diagnosis Date  . Arthritis    especially in shoulders  . Asthma   . Depression   . Diabetes mellitus   . GERD (gastroesophageal reflux disease)   . Headache(784.0)    "mild"  . Hypertension   . Mental disorder    depression  . Neuropathy (HCC)    feet   . Pain    arthritis pain - takes tramadol as needed  . Rectal polyp    very little bleeding with bowel movements- no pain    Patient Active Problem List   Diagnosis Date Noted  . Rectal cancer ([place]) 01/30/2013    Past Surgical History:  Procedure Laterality Date  . CESAREAN SECTION    . EUS N/A 11/21/2012   Procedure: LOWER ENDOSCOPIC ULTRASOUND (EUS);  Surgeon: Arta Silence, MD;  Location: [facility];  Service: Endoscopy;  Laterality: N/A;  . FLEXIBLE SIGMOIDOSCOPY N/A 11/21/2012   Procedure: FLEXIBLE SIGMOIDOSCOPY;  Surgeon: Arta Silence, MD;  Location:  [facility];  Service: Endoscopy;  Laterality: N/A;  . FLEXIBLE SIGMOIDOSCOPY N/A 01/28/2013   Procedure: FLEXIBLE SIGMOIDOSCOPY;  Surgeon: Leighton Ruff, MD;  Location: [facility];  Service: Endoscopy;  Laterality: N/A;  . LAPAROSCOPIC LOW ANTERIOR RESECTION N/A 01/29/2013   Procedure: LAPAROSCOPIC LOW ANTERIOR RESECTION, Rigid Proctoscopy;  Surgeon: Leighton Ruff, MD;  Location: [facility];  Service: General;  Laterality: N/A;  . LAPAROSCOPIC SIGMOID COLECTOMY N/A 11/14/2012   Procedure: DIAGNOSTIC LAPAROSCOPY AND SIGMOIDMOIDOSCOPY ;  Surgeon: Rolm Bookbinder, MD;  Location: [facility];  Service: General;  Laterality: N/A;  . TONSILLECTOMY    . TONSILLECTOMY AND ADENOIDECTOMY      Current Outpatient Rx  . Order #: [id number] Class: Historical Med  . Order #: [id number] Class: Historical Med  . Order #: [id number] Class: Historical Med  . Order #: [id number] Class: Historical Med  . Order #: [id number] Class: Historical Med  . Order #: [id number] Class: Historical Med  . Order #: [id number] Class: Historical Med  . Order #: [id number] Class: Historical Med  . Order #: [id number] Class: Historical Med    Allergies Penicillins  Family History  Problem Relation Age of Onset  . Hypertension Father   . Stroke Father   . Diabetes Father   . Heart disease Mother   . Hypertension Mother   . Hyperlipidemia Mother   . Hypertension Sister   . Hypertension Brother   . Hypertension Sister   . Hypertension Brother   .  Cancer Other 47    colon cancer   . Cancer Maternal Aunt     cancer, unknown type   . Cancer Maternal Uncle     bone cancer   . Cancer Paternal Aunt     lung cancer  . Cancer Paternal Uncle     lung cancer  . Cancer Maternal Uncle     colon cancer and prostate cancer   . Cancer Maternal Uncle     prostate cancer     Social History Social History  Substance Use Topics  . Smoking status: Current Every Day Smoker    Packs/day: 0.25    Years: 20.00    Types: Cigarettes  . Smokeless  tobacco: Never Used     Comment: one every 2-3 days  . Alcohol use 0.0 oz/week     Comment: 3 cans beer a week      pt states crack cocaine was 5 or 6 yrs ago     Review of Systems Constitutional: No fever/chills Eyes: No visual changes. ENT: No sore throat.Sinus pain and congestion. Cardiovascular: chest pain. Respiratory: Denies shortness of breath.Cough with cloudy mucus. Gastrointestinal:  abdominal pain.  nausea,  vomiting.  Intermittent diarrhea.  No constipation. Genitourinary: Negative for dysuria. Musculoskeletal: Negative for back pain. Skin: Negative for rash. Neurological: Negative for headaches, focal weakness or numbness. 10-point ROS otherwise negative.  ____________________________________________   PHYSICAL EXAM:  VITAL SIGNS: ED Triage Vitals  Enc Vitals Group     BP 06/02/16 2014 165/85     Pulse Rate 06/02/16 2014 80     Resp 06/02/16 2014 18     Temp 06/02/16 2014 98.1 F (36.7 C)     Temp Source 06/02/16 2014 Oral     SpO2 06/02/16 2014 100 %     Weight 06/02/16 2014 179 lb (81.2 kg)     Height 06/02/16 2014 _0  (1.6 m)     Head Circumference --      Peak Flow --      Pain Score 06/02/16 2015 7     Pain Loc --      Pain Edu? --      Excl. in Cut Off? --    Constitutional: Alert and oriented. Well appearing and in no acute distress. Eyes: Conjunctivae are normal. PERRL. EOMI. Head: Atraumatic.Tenderness along the frontal and maxillary sinus. Nose: Erythematous and the meatus both nasal turbinate. No visible discharge. Mouth/Throat: Tender, nodular swelling at upper palate, Mucous membranes are moist.  Oropharynx non-erythematous. Neck: No stridor.  No meningeal signs.  Cardiovascular: Normal rate, regular rhythm. Good peripheral circulation. Grossly normal heart sounds.   Respiratory: Normal respiratory effort.  No retractions. Lungs CTAB. Gastrointestinal: Soft and nontender. No distention.  Musculoskeletal: No lower extremity tenderness nor  edema. No gross deformities of extremities. Neurologic:  Normal speech and language. No gross focal neurologic deficits are appreciated.  Skin:  Skin is warm, dry and intact. No rash noted. Psychiatric: Mood and affect are normal. Speech and behavior are normal.  ____________________________________________   LABS (all labs ordered are listed, but only abnormal results are displayed)  Labs Reviewed  CBC WITH DIFFERENTIAL/PLATELET - Abnormal; Notable for the following:       Result Value   RBC 3.65 (*)    HCT 32.6 (*)    MCHC 36.8 (*)    All other components within normal limits  COMPREHENSIVE METABOLIC PANEL - Abnormal; Notable for the following:    Glucose, Bld 136 (*)    Creatinine,  Ser 1.30 (*)    Total Protein 8.5 (*)    ALT 13 (*)    GFR calc non Af Amer 43 (*)    GFR calc Af Amer 50 (*)    All other components within normal limits  I-STAT TROPOININ, ED  I-STAT TROPOININ, ED   ____________________________________________  EKG   EKG Interpretation  Date/Time:    Ventricular Rate:    PR Interval:    QRS Duration:   QT Interval:    QTC Calculation:   R Axis:     Text Interpretation:        ____________________________________________  RADIOLOGY  Dg Chest 2 View  Result Date: 06/02/2016 CLINICAL DATA:  Central chest pain for 1 or 2 weeks. Cough. Rectal carcinoma. EXAM: CHEST  2 VIEW COMPARISON:  Chest radiographs 04/18/2014, chest CT 01/12/2015 and PET-CT 05/14/2015. FINDINGS: The heart size and mediastinal contours are stable. There is mild interstitial prominence at both lung bases which appears mildly progressive on the lateral view. No edema, confluent airspace opacity, suspicious pulmonary nodule or pleural effusion identified. The bones appear unchanged. IMPRESSION: Suspected increased basilar interstitial lung disease, suspicious for pulmonary fibrosis. No acute findings identified. Electronically Signed   By: Richardean Sale M.D.   On: 06/02/2016 21:05     ____________________________________________   PROCEDURES  Procedure(s) performed:   Procedures   ____________________________________________   INITIAL IMPRESSION / ASSESSMENT AND PLAN / ED COURSE  Pertinent labs & imaging results that were available during my care of the patient were reviewed by me and considered in my medical decision making (see chart for details).  Erica [name] is a 60 y.o. female with past medical history significant for hypertension, diabetes and rectal cancer came to ED with complaint of persistent cough with cloudy colored mucus, sinus congestion, postnasal drip, chest pain and pain at roof of her mouth for 1-1/2 month. She saw her PCP with the same complaint one and a half month ago, she was given a course of antibiotic, with no relief. She states that her sinus pain and chest pain is getting worse. She developed pain in her upper palate for the last 2 weeks. Her chest pain is associated with coughing, she denies any worsening of pain with deep breathing. She denies any fever or chills. She also complained of headache, denies any change in vision. Also complained of mild nausea and vomiting since yesterday.  Her chest x-ray showed increased bibasilar interstitial lung disease, suspicious for pulmonary fibrosis.  Her basic labs and EKG including troponin X 2 are within normal limits.  She was given some fluid and Zofran and Toradol. She needs to be seen by a pulmonologist for her lung findings.  She is being discharged home with a course of doxycycline, prednisone and Flonase.  ____________________________________________  FINAL CLINICAL IMPRESSION(S) / ED DIAGNOSES   sinusitis, pulmonary fibrosis.   MEDICATIONS GIVEN DURING THIS VISIT:  Medications  sodium chloride 0.9 % bolus 1,000 mL (1,000 mLs Intravenous New Bag/Given 06/02/16 2218)  ondansetron (ZOFRAN) injection 4 mg (4 mg Intravenous Given 06/02/16 2219)  ketorolac (TORADOL) 30 MG/ML  injection 30 mg (30 mg Intravenous Given 06/02/16 2219)     NEW OUTPATIENT MEDICATIONS STARTED DURING THIS VISIT:  New Prescriptions   No medications on file      Note:  This document was prepared using Dragon voice recognition software and may include unintentional dictation errors.  Emergency Medicine   Lorella Nimrod, MD 06/02/16 [id number]    Veryl Speak, MD 06/02/16  [id number]

## 2016-06-02 NOTE — ED Notes (Signed)
Patient Alert and oriented X4. Stable and ambulatory. Patient verbalized understanding of the discharge instructions.  Patient belongings were taken by the patient.  

## 2016-06-02 NOTE — ED Triage Notes (Signed)
Pt to ED with c/o upper chest pain with cough x's 1 month.  Pt st's she was seen by her MD and given antibiotics and cough meds. But st's she is not any better

## 2016-06-02 NOTE — Discharge Instructions (Signed)
Thank you for visiting ED today. Please take your medications as directed. Please make an appointment with pulmonologist at the above-mentioned number for your persistent cough.

## 2016-06-08 ENCOUNTER — Other Ambulatory Visit: Payer: Self-pay | Admitting: General Surgery

## 2016-06-21 ENCOUNTER — Encounter: Payer: Self-pay | Admitting: Pulmonary Disease

## 2016-06-21 ENCOUNTER — Other Ambulatory Visit: Payer: Self-pay | Admitting: Hematology

## 2016-06-21 ENCOUNTER — Ambulatory Visit (INDEPENDENT_AMBULATORY_CARE_PROVIDER_SITE_OTHER): Payer: Medicare HMO | Admitting: Pulmonary Disease

## 2016-06-21 VITALS — BP 140/80 | HR 76 | Ht 63.0 in | Wt 178.8 lb

## 2016-06-21 DIAGNOSIS — G4710 Hypersomnia, unspecified: Secondary | ICD-10-CM | POA: Insufficient documentation

## 2016-06-21 DIAGNOSIS — J439 Emphysema, unspecified: Secondary | ICD-10-CM | POA: Diagnosis not present

## 2016-06-21 DIAGNOSIS — J449 Chronic obstructive pulmonary disease, unspecified: Secondary | ICD-10-CM | POA: Insufficient documentation

## 2016-06-21 DIAGNOSIS — J849 Interstitial pulmonary disease, unspecified: Secondary | ICD-10-CM | POA: Diagnosis not present

## 2016-06-21 DIAGNOSIS — Z720 Tobacco use: Secondary | ICD-10-CM | POA: Insufficient documentation

## 2016-06-21 DIAGNOSIS — K219 Gastro-esophageal reflux disease without esophagitis: Secondary | ICD-10-CM | POA: Insufficient documentation

## 2016-06-21 DIAGNOSIS — R059 Cough, unspecified: Secondary | ICD-10-CM | POA: Insufficient documentation

## 2016-06-21 DIAGNOSIS — R05 Cough: Secondary | ICD-10-CM

## 2016-06-21 MED ORDER — MONTELUKAST SODIUM 10 MG PO TABS: 10.0000 mg | ORAL_TABLET | Freq: Every day | ORAL | 0 refills | Status: DC

## 2016-06-21 NOTE — Assessment & Plan Note (Signed)
Tried to take Protonix at bedtime. Head of bed 30. Dietary changes. May need Zantac 150 twice a day if not better.

## 2016-06-21 NOTE — Assessment & Plan Note (Signed)
Smoking cessation  

## 2016-06-21 NOTE — Assessment & Plan Note (Signed)
Patient with recent  sinus issues necessitating ER visit in December 2017. Chest x-ray done in December 2017 showed increased interstitial markings at the bottom. Patient known to have rectal cancer, in remission. She had a chest CT scan in August 2016 which showed several subcentimeter pulmonary nodules, bilateral as well as some atelectasis/mild thickening of the interstitium at the bases. During my exam, I do not her much crackles. We need to get a chest CT scan, HRCT, to determine whether she has ILD or not. Chest CT scan will be good also to make sure that her lung nodules are stable given the fact that she had rectal cancer. She is scheduled to have a scan of her abdomen and chest next month by her [place] MD.  I will need to touch base with her so that we only do the test once.

## 2016-06-21 NOTE — Patient Instructions (Signed)
It was a pleasure taking care of you today!  You are diagnosed with Chronic Obstructive Pulmonary Disease or COPD.  COPD is a preventable and treatable disease that makes it difficult to empty air out of the lungs (airflow obstruction).  This can lead to shortness of breath.   Smoking makes COPD worse.   Rescue medications: Albuterol 2 puffs every 4 hours as needed for shortness of breath.   Please rinse your mouth each time you use your maintenance medication.  Please call the office if you are having issues with your medications  You also diagnosed with reflux disease making your cough worse. Please take your Protonix at bedtime. Continue using her Flonase, 2 squirts per nostril at bedtime. We will prescribe you  Singulair, 10 mg per tablet, 1 tablet at bedtime.  We will get back at you  regarding when chest CT scan will be done. We'll schedule you for a breathing test. We will also do a sleep study to determine whether we have CPAP or not.   Return to clinic in 6-8 weeks with Dr. Corrie Dandy or NP

## 2016-06-21 NOTE — Assessment & Plan Note (Signed)
Has hypersomnia, snoring, fatigue, gasping, unrefreshed sleep. Hypersomnia affects her functionality. ESS 10.  Plan for a split night sleep study. Her niece has sleep apnea and uses CPAP. Anticipate no issues with CPAP.

## 2016-06-21 NOTE — Assessment & Plan Note (Signed)
Patient with one pack per week smoking history since her 42s. Down to a cigarette a day. Was diagnosed with asthma/COPD several years ago. Uses albuterol when necessary, usually 1-2 times a week. Her dyspnea is not significantly worse. She is functional.  Plan : 1. Smoking cessation done. 2. She is happy with her current exertional dyspnea being controlled. She uses albuterol when necessary. I told her to give [place] a call if she gets more symptomatic. At that point, she'll probably need Symbicort or Dulera or breo.  3. Needs PFT.  4. Will need alpha one 5. Need to ask re: PNA vaccine on f/u.

## 2016-06-21 NOTE — Assessment & Plan Note (Signed)
Related to upper airway cough syndrome and reflux disease. Cough usually worse at night. Rx GERD. Cont Flonase. Add singulair 10 mg/tab, 1 tab at HS

## 2016-06-21 NOTE — Progress Notes (Signed)
Subjective:    Patient ID: Erica [name], female    DOB: 1955-09-28, 61 y.o.   MRN: [id number]  HPI   This is the case of Erica [name], 61 y.o. Female, who was referred by Dr. Lorella Nimrod in consultation regarding abnormal chest ct scan.   As you very well know, patient smoked 1 pack per week since her 61s, curently down to a cigarette a day, was diagnosed with asthma/copd several yrs ago. Uses alb MDI prn, usually 1-2 x/week. She gets more than SOB with more than ADLs. She is not on o2.   She had rectal CA in 2014 for which she had surgery. Still in remission.  She had a chest ct scan in 01/2015 as part of CA surveillance.  That showed several pulm nodules (subcm) which have been stable, and some smaller.  Also with increased markings at the bases.   She had sinus issues in December.  Wen tot ER as she was not getting better.  CXR showed inc interstitial markings, hence the consult today. She is almost back to her baseline.   She has GERD, controlled with meds. She coughs daily, coughs in sleep. Cough worse at night.  On and off wheezing at HS.   Her sinus issues are better. Has PND.    Has snoring, gaspin, choking, unrefreshed sleep.  Wakes up tired and fatigued.  ESS 10. Hypersomnia affects her fxnality.     Review of Systems  Constitutional: Positive for unexpected weight change. Negative for fever.  HENT: Positive for congestion, postnasal drip and sneezing. Negative for dental problem, ear pain, nosebleeds, rhinorrhea, sinus pressure, sore throat and trouble swallowing.   Eyes: Negative.  Negative for redness and itching.  Respiratory: Positive for cough, shortness of breath and wheezing. Negative for chest tightness.   Cardiovascular: Negative.  Negative for palpitations and leg swelling.  Gastrointestinal: Negative.  Negative for nausea and vomiting.  Genitourinary: Negative.  Negative for dysuria.  Musculoskeletal: Negative.  Negative for joint swelling.  Skin: Negative.   Negative for rash.  Neurological: Positive for headaches.  Hematological: Negative.  Does not bruise/bleed easily.  Psychiatric/Behavioral: Negative.  Negative for dysphoric mood. The patient is not nervous/anxious.     Past Medical History:  Diagnosis Date  . Arthritis    especially in shoulders  . Asthma   . Depression   . Diabetes mellitus   . GERD (gastroesophageal reflux disease)   . Headache(784.0)    "mild"  . Hypertension   . Mental disorder    depression  . Neuropathy (HCC)    feet   . Pain    arthritis pain - takes tramadol as needed  . Rectal polyp    very little bleeding with bowel movements- no pain   (-) other CA. (-) DVT  Family History  Problem Relation Age of Onset  . Hypertension Father   . Stroke Father   . Diabetes Father   . Heart disease Mother   . Hypertension Mother   . Hyperlipidemia Mother   . Hypertension Sister   . Hypertension Brother   . Hypertension Sister   . Hypertension Brother   . Cancer Other 47    colon cancer   . Cancer Maternal Aunt     cancer, unknown type   . Cancer Maternal Uncle     bone cancer   . Cancer Paternal Aunt     lung cancer  . Cancer Paternal Uncle     lung cancer  .  Cancer Maternal Uncle     colon cancer and prostate cancer   . Cancer Maternal Uncle     prostate cancer      Past Surgical History:  Procedure Laterality Date  . CESAREAN SECTION    . EUS N/A 11/21/2012   Procedure: LOWER ENDOSCOPIC ULTRASOUND (EUS);  Surgeon: Arta Silence, MD;  Location: [facility];  Service: Endoscopy;  Laterality: N/A;  . FLEXIBLE SIGMOIDOSCOPY N/A 11/21/2012   Procedure: FLEXIBLE SIGMOIDOSCOPY;  Surgeon: Arta Silence, MD;  Location: [facility];  Service: Endoscopy;  Laterality: N/A;  . FLEXIBLE SIGMOIDOSCOPY N/A 01/28/2013   Procedure: FLEXIBLE SIGMOIDOSCOPY;  Surgeon: Leighton Ruff, MD;  Location: [facility];  Service: Endoscopy;  Laterality: N/A;  . LAPAROSCOPIC LOW ANTERIOR RESECTION N/A 01/29/2013    Procedure: LAPAROSCOPIC LOW ANTERIOR RESECTION, Rigid Proctoscopy;  Surgeon: Leighton Ruff, MD;  Location: [facility];  Service: General;  Laterality: N/A;  . LAPAROSCOPIC SIGMOID COLECTOMY N/A 11/14/2012   Procedure: DIAGNOSTIC LAPAROSCOPY AND SIGMOIDMOIDOSCOPY ;  Surgeon: Rolm Bookbinder, MD;  Location: [facility];  Service: General;  Laterality: N/A;  . TONSILLECTOMY    . TONSILLECTOMY AND ADENOIDECTOMY      Social History   Social History  . Marital status: Single    Spouse name: N/A  . Number of children: N/A  . Years of education: N/A   Occupational History  . Not on file.   Social History Main Topics  . Smoking status: Current Every Day Smoker    Packs/day: 0.25    Years: 20.00    Types: Cigarettes  . Smokeless tobacco: Never Used     Comment: one every 2-3 days  . Alcohol use 0.0 oz/week     Comment: 3 cans beer a week      pt states crack cocaine was 5 or 6 yrs ago   . Drug use:     Types: "Crack" cocaine     Comment: last time was a year ago   . Sexual activity: No   Other Topics Concern  . Not on file   Social History Narrative  . No narrative on file    [place]. Worked as a Theme park manager. Occasionally drinks beer.   Allergies  Allergen Reactions  . Penicillins Hives, Itching and Swelling    Tongue swells up Has patient had a PCN reaction causing immediate rash, facial/tongue/throat swelling, SOB or lightheadedness with hypotension: Yes Has patient had a PCN reaction causing severe rash involving mucus membranes or skin necrosis: Yes Has patient had a PCN reaction that required hospitalization Yes Has patient had a PCN reaction occurring within the last 10 years: No If all of the above answers are "NO", then may proceed with Cephalosporin use.      Outpatient Medications Prior to Visit  Medication Sig Dispense Refill  . albuterol (PROVENTIL HFA;VENTOLIN HFA) 108 (90 BASE) MCG/ACT inhaler Inhale 2 puffs into the lungs every 4 (four) hours as needed  for wheezing.    . fluticasone (FLONASE) 50 MCG/ACT nasal spray Place 2 sprays into both nostrils daily. 16 g 2  . gabapentin (NEURONTIN) 300 MG capsule Take 300 mg by mouth 3 (three) times daily.     Marland Kitchen losartan-hydrochlorothiazide (HYZAAR) 50-12.5 MG tablet Take 1 tablet by mouth daily.    . Multiple Vitamin (MULTIVITAMIN WITH MINERALS) TABS tablet Take 1 tablet by mouth daily.    . pantoprazole (PROTONIX) 40 MG tablet Take 40 mg by mouth daily.    . simvastatin (ZOCOR) 10 MG tablet Take  89ms at bedtime  5  . sodium chloride (OCEAN) 0.65 % SOLN nasal spray Place 1 spray into both nostrils as needed for congestion. 88 mL 0  . tiZANidine (ZANAFLEX) 4 MG tablet Take 4 mg by mouth 3 (three) times daily as needed for spasms.  5  . TOUJEO SOLOSTAR 300 UNIT/ML SOPN Inject 40 Units into the skin at bedtime.   1  . traMADol (ULTRAM) 50 MG tablet Take 50 mg by mouth 2 (two) times daily as needed for pain.    .Marland Kitchenvortioxetine HBr (TRINTELLIX) 20 MG TABS Take 20 mg by mouth daily.    .Marland Kitchendoxycycline (VIBRAMYCIN) 100 MG capsule Take 1 capsule (100 mg total) by mouth 2 (two) times daily. (Patient not taking: Reported on 06/21/2016) 20 capsule 0   No facility-administered medications prior to visit.    Meds ordered this encounter  Medications  . montelukast (SINGULAIR) 10 MG tablet    Sig: Take 1 tablet (10 mg total) by mouth at bedtime.    Dispense:  30 tablet    Refill:  0         Objective:   Physical Exam  Vitals:  Vitals:   06/21/16 1446  BP: 140/80  Pulse: 76  SpO2: 100%  Weight: 178 lb 12.8 oz (81.1 kg)  Height: _0  (1.6 m)    Constitutional/General:  Pleasant, well-nourished, well-developed, not in any distress,  Comfortably seating.  Well kempt  Body mass index is 31.67 kg/m. Wt Readings from Last 3 Encounters:  06/21/16 178 lb 12.8 oz (81.1 kg)  06/02/16 179 lb (81.2 kg)  02/02/16 174 lb 1.6 oz (79 kg)    HEENT: Pupils equal and reactive to light and accommodation.  Anicteric sclerae. Normal nasal mucosa.   No oral  lesions,  mouth clear,  oropharynx clear, no postnasal drip. (-) Oral thrush. No dental caries.  Airway - Mallampati class III  Neck: No masses. Midline trachea. No JVD, (-) LAD. (-) bruits appreciated.  Respiratory/Chest: Grossly normal chest. (-) deformity. (-) Accessory muscle use.  Symmetric expansion. (-) Tenderness on palpation.  Resonant on percussion.  Diminished BS on both lower lung zones. (-) wheezing,  rhonchi . Occasional bibasilar crackles.  (-) egophony  Cardiovascular: Regular rate and  rhythm, heart sounds normal, no murmur or gallops, no peripheral edema  Gastrointestinal:  Normal bowel sounds. Soft, non-tender. No hepatosplenomegaly.  (-) masses.   Musculoskeletal:  Normal muscle tone. Normal gait.   Extremities: Grossly normal. (-) clubbing, cyanosis.  (-) edema  Skin: (-) rash,lesions seen.   Neurological/Psychiatric : alert, oriented to time, place, person. Normal mood and affect          Assessment & Plan:  Interstitial lung disease ([facility] Patient with recent  sinus issues necessitating ER visit in December 2017. Chest x-ray done in December 2017 showed increased interstitial markings at the bottom. Patient known to have rectal cancer, in remission. She had a chest CT scan in August 2016 which showed several subcentimeter pulmonary nodules, bilateral as well as some atelectasis/mild thickening of the interstitium at the bases. During my exam, I do not her much crackles. We need to get a chest CT scan, HRCT, to determine whether she has ILD or not. Chest CT scan will be good also to make sure that her lung nodules are stable given the fact that she had rectal cancer. She is scheduled to have a scan of her abdomen and chest next month by her CA MD.  I will need  to touch base with her so that we only do the test once.  COPD (chronic obstructive pulmonary disease) ([place]) Patient with one pack per week  smoking history since her 60s. Down to a cigarette a day. Was diagnosed with asthma/COPD several years ago. Uses albuterol when necessary, usually 1-2 times a week. Her dyspnea is not significantly worse. She is functional.  Plan : 1. Smoking cessation done. 2. She is happy with her current exertional dyspnea being controlled. She uses albuterol when necessary. I told her to give [place] a call if she gets more symptomatic. At that point, she'll probably need Symbicort or Dulera or breo.  3. Needs PFT.  4. Will need alpha one 5. Need to ask re: PNA vaccine on f/u.   Cough Related to upper airway cough syndrome and reflux disease. Cough usually worse at night. Rx GERD. Cont Flonase. Add singulair 10 mg/tab, 1 tab at HS  GERD (gastroesophageal reflux disease) Tried to take Protonix at bedtime. Head of bed 30. Dietary changes. May need Zantac 150 twice a day if not better.  Hypersomnia Has hypersomnia, snoring, fatigue, gasping, unrefreshed sleep. Hypersomnia affects her functionality. ESS 10.  Plan for a split night sleep study. Her niece has sleep apnea and uses CPAP. Anticipate no issues with CPAP.  Tobacco user Smoking cessation      Thank you very much for letting me participate in this patient's care. Please do not hesitate to give me a call if you have any questions or concerns regarding the treatment plan.   Patient will follow up with me in 6-8 weeks    J. Shirl Harris, MD 06/21/2016   3:24 PM Pulmonary and [place] Pager: [phone] Office: [phone], Fax: [phone]

## 2016-06-22 ENCOUNTER — Other Ambulatory Visit: Payer: Self-pay

## 2016-06-22 DIAGNOSIS — J849 Interstitial pulmonary disease, unspecified: Secondary | ICD-10-CM

## 2016-06-30 ENCOUNTER — Inpatient Hospital Stay: Admission: RE | Admit: 2016-06-30 | Payer: Medicare HMO | Source: Ambulatory Visit

## 2016-07-06 ENCOUNTER — Ambulatory Visit (INDEPENDENT_AMBULATORY_CARE_PROVIDER_SITE_OTHER)
Admission: RE | Admit: 2016-07-06 | Discharge: 2016-07-06 | Disposition: A | Discharge status: Home / Self Care | Payer: Medicare HMO | Source: Ambulatory Visit | Attending: Pulmonary Disease | Admitting: Pulmonary Disease

## 2016-07-06 DIAGNOSIS — J849 Interstitial pulmonary disease, unspecified: Secondary | ICD-10-CM

## 2016-07-06 IMAGING — CT CT CHEST HIGH RESOLUTION W/O CM
2 of 5 series · 15 of 36 positions shown, 18 images · non-contrast
Comparison: [DATE] and [DATE].

CLINICAL DATA: Chronic shortness of breath and productive cough.
Smoker. Interstitial lung disease.

EXAM:
CT CHEST WITHOUT CONTRAST
TECHNIQUE: Multidetector CT imaging of the chest was performed following the
standard protocol without intravenous contrast. High resolution
imaging of the lungs, as well as inspiratory and expiratory imaging,
was performed.

[Series 2: high resolution · axial · 0.68mm/px · z∈[-308,-32]mm · 12 of 154 slices shown, 15 images]
[im 8/154  mediastinal]
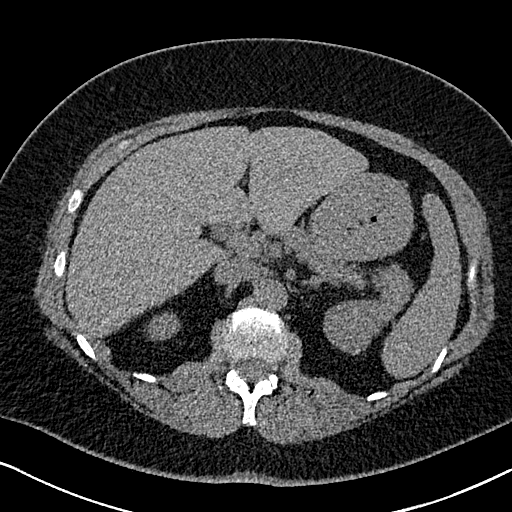
[im 8/154  lung]
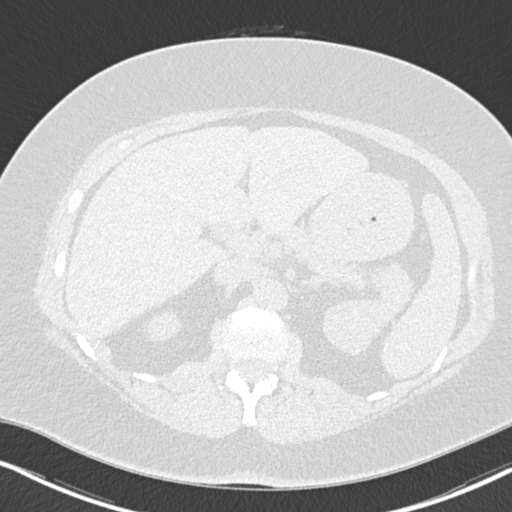
[im 22/154  lung]
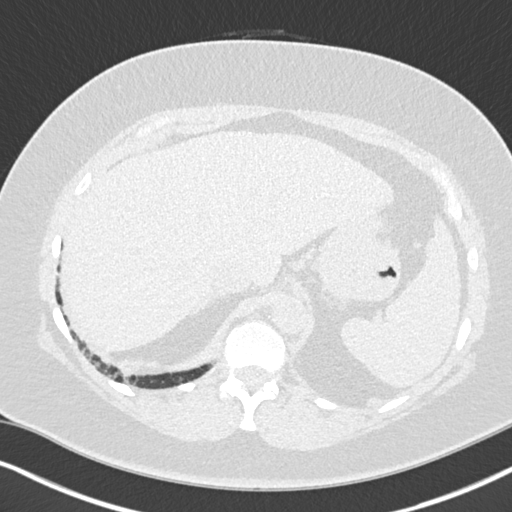
[im 37/154  lung]
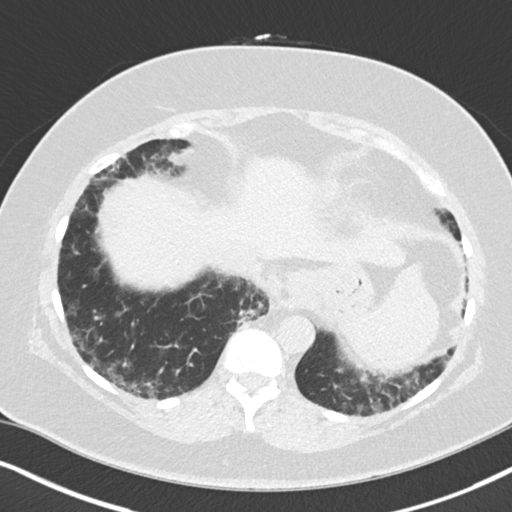
[im 44/154  lung]
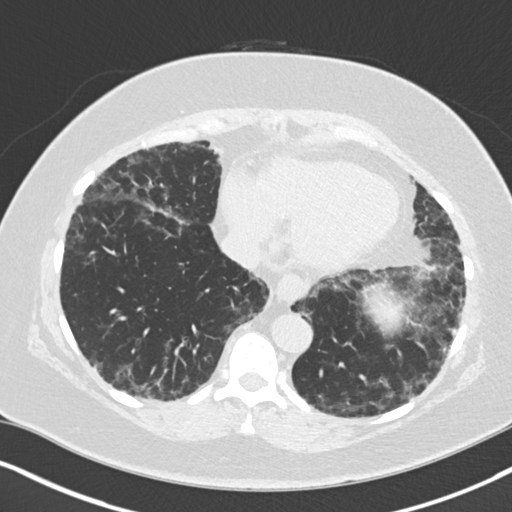
[im 59/154  mediastinal]
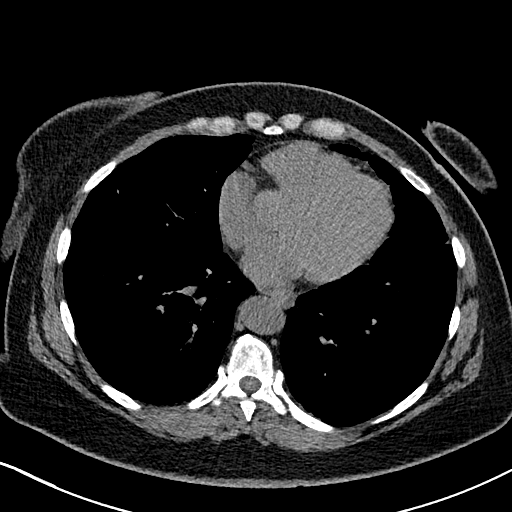
[im 59/154  lung]
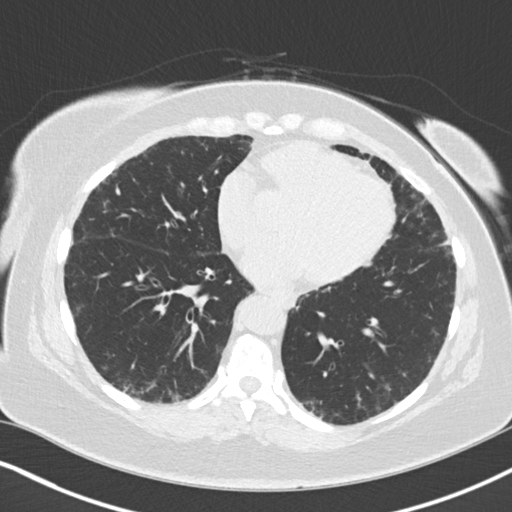
[im 73/154  lung]
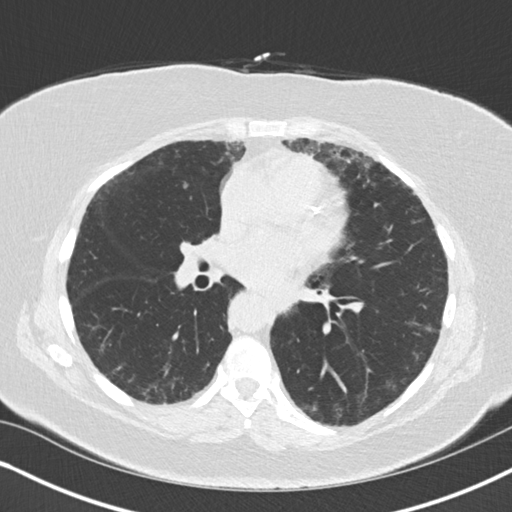
[im 81/154  lung]
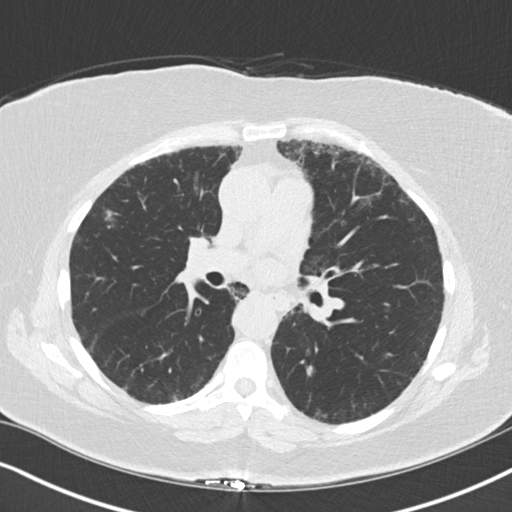
[im 95/154  lung]
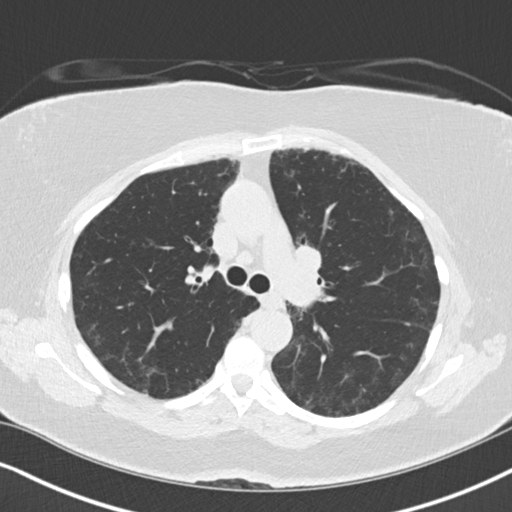
[im 110/154  mediastinal]
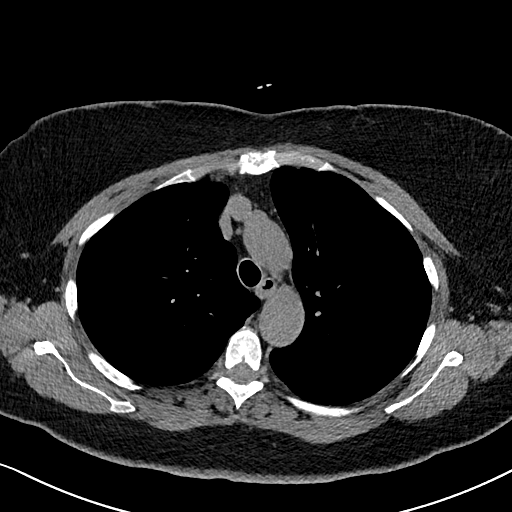
[im 110/154  lung]
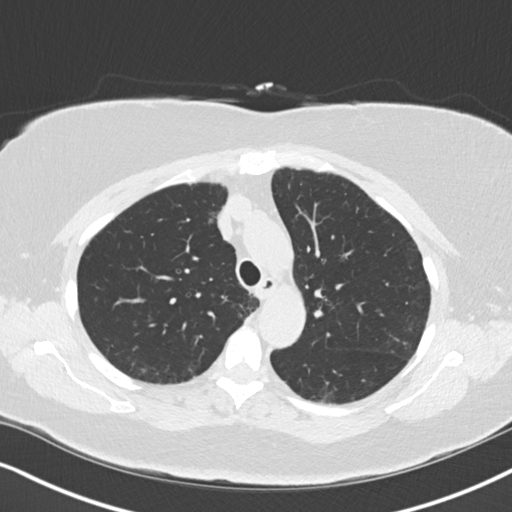
[im 117/154  lung]
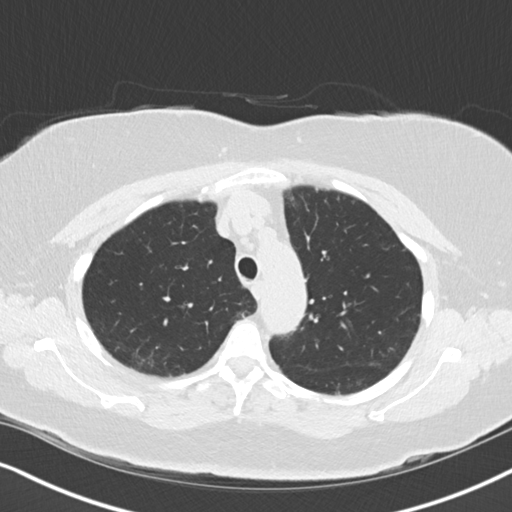
[im 132/154  lung]
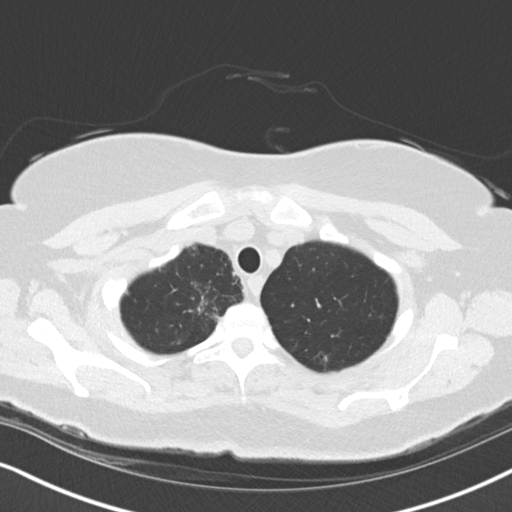
[im 146/154  lung]
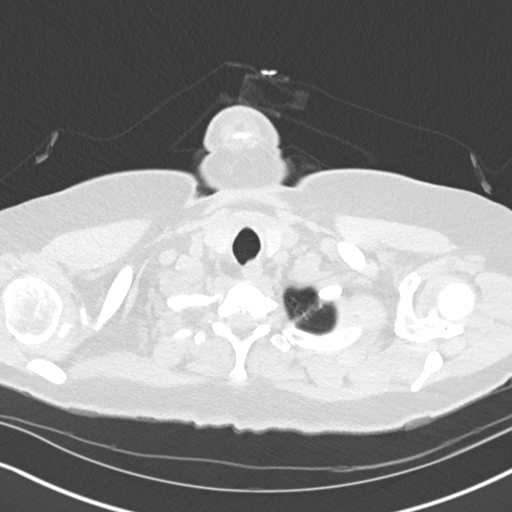

[Series 8: coronal · coronal · 0.61mm/px · 3 of 124 slices shown]
[im 25/124  lung]
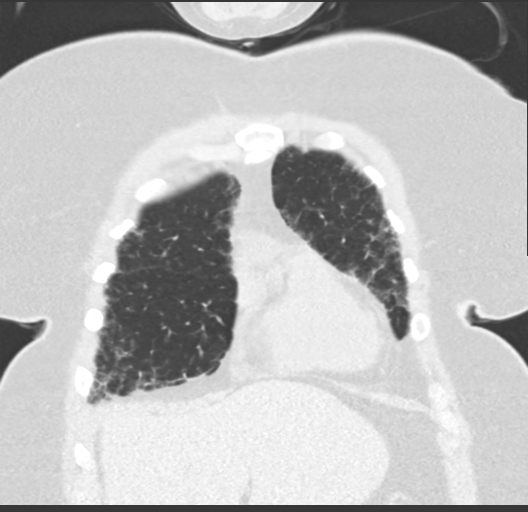
[im 50/124  lung]
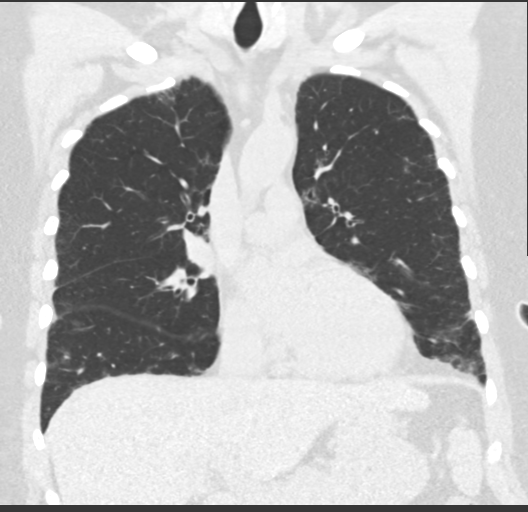
[im 74/124  lung]
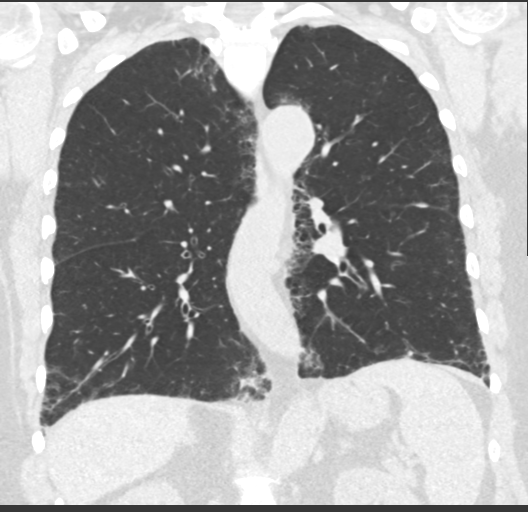

[15 of 36 positions shown; findings below may reference images not displayed]

FINDINGS: Cardiovascular: Coronary artery calcification. Heart size normal. No
pericardial effusion.

Mediastinum/Nodes: 11 mm low right paratracheal lymph node, similar.
Hilar regions are difficult to definitively evaluate without IV
contrast. No axillary adenopathy. Esophagus is grossly unremarkable.

Lungs/Pleura: Peripheral and basilar predominant pattern of
subpleural reticulation, ground-glass and traction bronchiolectasis.
No honeycombing. Findings have progressed from [DATE]. Scattered
subpleural lymph nodes are unchanged from [DATE]. No pleural
fluid. Debris is seen dependently in the trachea. Airway is
otherwise unremarkable.

Upper Abdomen: Visualized portion of the liver is unremarkable.
Stones are seen in the gallbladder. Visualized portions of the
adrenal glands, kidneys, spleen, pancreas, stomach and bowel are
grossly unremarkable. No upper abdominal adenopathy.

Musculoskeletal: No worrisome lytic or sclerotic lesions.
IMPRESSION: 1. Pulmonary parenchymal pattern of fibrosis is progressive from
[DATE], indicative of usual interstitial pneumonitis.
2. Coronary artery calcification.
3. Cholelithiasis.

## 2016-07-13 ENCOUNTER — Encounter (HOSPITAL_COMMUNITY)
Admission: RE | Disposition: A | Discharge status: Home / Self Care | Payer: Self-pay | Source: Ambulatory Visit | Attending: General Surgery

## 2016-07-13 ENCOUNTER — Ambulatory Visit (HOSPITAL_COMMUNITY)
Admission: RE | Admit: 2016-07-13 | Discharge: 2016-07-13 | Disposition: A | Discharge status: Home / Self Care | Payer: Medicare HMO | Source: Ambulatory Visit | Attending: General Surgery | Admitting: General Surgery

## 2016-07-13 DIAGNOSIS — E119 Type 2 diabetes mellitus without complications: Secondary | ICD-10-CM | POA: Insufficient documentation

## 2016-07-13 DIAGNOSIS — R159 Full incontinence of feces: Secondary | ICD-10-CM | POA: Insufficient documentation

## 2016-07-13 DIAGNOSIS — Z7951 Long term (current) use of inhaled steroids: Secondary | ICD-10-CM | POA: Insufficient documentation

## 2016-07-13 DIAGNOSIS — J45909 Unspecified asthma, uncomplicated: Secondary | ICD-10-CM | POA: Diagnosis not present

## 2016-07-13 DIAGNOSIS — K219 Gastro-esophageal reflux disease without esophagitis: Secondary | ICD-10-CM | POA: Insufficient documentation

## 2016-07-13 DIAGNOSIS — G629 Polyneuropathy, unspecified: Secondary | ICD-10-CM | POA: Diagnosis not present

## 2016-07-13 DIAGNOSIS — Z85048 Personal history of other malignant neoplasm of rectum, rectosigmoid junction, and anus: Secondary | ICD-10-CM | POA: Insufficient documentation

## 2016-07-13 DIAGNOSIS — F17210 Nicotine dependence, cigarettes, uncomplicated: Secondary | ICD-10-CM | POA: Diagnosis not present

## 2016-07-13 DIAGNOSIS — F329 Major depressive disorder, single episode, unspecified: Secondary | ICD-10-CM | POA: Diagnosis not present

## 2016-07-13 DIAGNOSIS — Z79899 Other long term (current) drug therapy: Secondary | ICD-10-CM | POA: Insufficient documentation

## 2016-07-13 DIAGNOSIS — I10 Essential (primary) hypertension: Secondary | ICD-10-CM | POA: Diagnosis not present

## 2016-07-13 HISTORY — PX: RECTAL ULTRASOUND: SHX2306

## 2016-07-13 HISTORY — PX: ANAL RECTAL MANOMETRY: SHX6358

## 2016-07-13 SURGERY — US RECTUM
Anesthesia: Moderate Sedation

## 2016-07-13 SURGERY — MANOMETRY, ANORECTAL

## 2016-07-13 NOTE — Discharge Instructions (Signed)
Follow up in 2-3 weeks

## 2016-07-13 NOTE — H&P (Addendum)
Erica [name] is an 61 y.o. female.   Chief Complaint: fecal incontinence HPI: Pt with fecal incontinence on a weekly basis.  Here for further workup.  She has a h/o an LAR for proximal rectal cancer.  Past Medical History:  Diagnosis Date  . Arthritis    especially in shoulders  . Asthma   . Depression   . Diabetes mellitus   . GERD (gastroesophageal reflux disease)   . Headache(784.0)    "mild"  . Hypertension   . Mental disorder    depression  . Neuropathy (HCC)    feet   . Pain    arthritis pain - takes tramadol as needed  . Rectal polyp    very little bleeding with bowel movements- no pain    Past Surgical History:  Procedure Laterality Date  . CESAREAN SECTION    . EUS N/A 11/21/2012   Procedure: LOWER ENDOSCOPIC ULTRASOUND (EUS);  Surgeon: Arta Silence, MD;  Location: [facility];  Service: Endoscopy;  Laterality: N/A;  . FLEXIBLE SIGMOIDOSCOPY N/A 11/21/2012   Procedure: FLEXIBLE SIGMOIDOSCOPY;  Surgeon: Arta Silence, MD;  Location: [facility];  Service: Endoscopy;  Laterality: N/A;  . FLEXIBLE SIGMOIDOSCOPY N/A 01/28/2013   Procedure: FLEXIBLE SIGMOIDOSCOPY;  Surgeon: Leighton Ruff, MD;  Location: [facility];  Service: Endoscopy;  Laterality: N/A;  . LAPAROSCOPIC LOW ANTERIOR RESECTION N/A 01/29/2013   Procedure: LAPAROSCOPIC LOW ANTERIOR RESECTION, Rigid Proctoscopy;  Surgeon: Leighton Ruff, MD;  Location: [facility];  Service: General;  Laterality: N/A;  . LAPAROSCOPIC SIGMOID COLECTOMY N/A 11/14/2012   Procedure: DIAGNOSTIC LAPAROSCOPY AND SIGMOIDMOIDOSCOPY ;  Surgeon: Rolm Bookbinder, MD;  Location: [facility];  Service: General;  Laterality: N/A;  . TONSILLECTOMY    . TONSILLECTOMY AND ADENOIDECTOMY      Family History  Problem Relation Age of Onset  . Hypertension Father   . Stroke Father   . Diabetes Father   . Heart disease Mother   . Hypertension Mother   . Hyperlipidemia Mother   . Hypertension Sister   . Hypertension Brother   . Hypertension Sister    . Hypertension Brother   . Cancer Other 47    colon cancer   . Cancer Maternal Aunt     cancer, unknown type   . Cancer Maternal Uncle     bone cancer   . Cancer Paternal Aunt     lung cancer  . Cancer Paternal Uncle     lung cancer  . Cancer Maternal Uncle     colon cancer and prostate cancer   . Cancer Maternal Uncle     prostate cancer    Social History:  reports that she has been smoking Cigarettes.  She has a 5.00 pack-year smoking history. She has never used smokeless tobacco. She reports that she drinks alcohol. She reports that she uses drugs, including "Crack" cocaine.  Allergies:  Allergies  Allergen Reactions  . Penicillins Hives, Itching and Swelling    Tongue swells up Has patient had a PCN reaction causing immediate rash, facial/tongue/throat swelling, SOB or lightheadedness with hypotension: Yes Has patient had a PCN reaction causing severe rash involving mucus membranes or skin necrosis: Yes Has patient had a PCN reaction that required hospitalization Yes Has patient had a PCN reaction occurring within the last 10 years: No If all of the above answers are "NO", then may proceed with Cephalosporin use.     Medications Prior to Admission  Medication Sig Dispense Refill  . albuterol (PROVENTIL HFA;VENTOLIN HFA) 108 (90  BASE) MCG/ACT inhaler Inhale 2 puffs into the lungs every 4 (four) hours as needed for wheezing.    . fluticasone (FLONASE) 50 MCG/ACT nasal spray Place 2 sprays into both nostrils daily. 16 g 2  . gabapentin (NEURONTIN) 300 MG capsule Take 300 mg by mouth 3 (three) times daily.     Marland Kitchen losartan-hydrochlorothiazide (HYZAAR) 50-12.5 MG tablet Take 1 tablet by mouth daily.    . Multiple Vitamin (MULTIVITAMIN WITH MINERALS) TABS tablet Take 1 tablet by mouth daily.    . pantoprazole (PROTONIX) 40 MG tablet Take 40 mg by mouth daily.    . simvastatin (ZOCOR) 10 MG tablet Take 42ms at bedtime  5  . sodium chloride (OCEAN) 0.65 % SOLN nasal spray Place 1  spray into both nostrils as needed for congestion. 88 mL 0  . tiZANidine (ZANAFLEX) 4 MG tablet Take 4 mg by mouth 3 (three) times daily as needed for spasms.  5  . TOUJEO SOLOSTAR 300 UNIT/ML SOPN Inject 40 Units into the skin at bedtime.   1  . traMADol (ULTRAM) 50 MG tablet Take 50 mg by mouth 2 (two) times daily as needed for pain.    .Marland Kitchenvortioxetine HBr (TRINTELLIX) 20 MG TABS Take 20 mg by mouth daily.    .Marland Kitchendoxycycline (VIBRAMYCIN) 100 MG capsule Take 1 capsule (100 mg total) by mouth 2 (two) times daily. (Patient not taking: Reported on 06/21/2016) 20 capsule 0  . montelukast (SINGULAIR) 10 MG tablet Take 1 tablet (10 mg total) by mouth at bedtime. 30 tablet 0    No results found for this or any previous visit (from the past 48 hour(s)). No results found.  Review of Systems  Constitutional: Negative for chills and fever.  HENT: Negative for hearing loss and tinnitus.   Eyes: Negative for blurred vision and double vision.  Respiratory: Negative for cough and shortness of breath.   Cardiovascular: Negative for chest pain and palpitations.  Gastrointestinal: Negative for abdominal pain, nausea and vomiting.  Genitourinary: Negative for dysuria, frequency and urgency.  Skin: Negative for itching and rash.    Height (P) _0  (1.6 m), weight (P) 80.7 kg (178 lb). Physical Exam  Constitutional: She is oriented to person, place, and time. She appears well-developed and well-nourished.  HENT:  Head: Normocephalic and atraumatic.  Eyes: Conjunctivae and EOM are normal. Pupils are equal, round, and reactive to light.  Neck: Normal range of motion. Neck supple.  Cardiovascular: Normal rate and regular rhythm.   Respiratory: Effort normal and breath sounds normal. No respiratory distress.  GI: Soft. She exhibits no distension. There is no tenderness.  Musculoskeletal: Normal range of motion.  Neurological: She is alert and oriented to person, place, and time.     Assessment/Plan Anal [place] and manometry  eval bowel diary  TRosario Adie, MD 11/32/4401 11:28 AM

## 2016-07-13 NOTE — Op Note (Signed)
07/13/2016  11:31 AM  PATIENT:  Erica [name]  61 y.o. female  Patient Care Team: Nolene Ebbs, MD as PCP - General (Internal Medicine) Truitt Merle, MD as Consulting Physician (Hematology) Leighton Ruff, MD as Consulting Physician (General Surgery) Arta Silence, MD as Consulting Physician (Gastroenterology)  PRE-OPERATIVE DIAGNOSIS:  fecal incontinence  POST-OPERATIVE DIAGNOSIS:  Fecal incontinence  PROCEDURE:  Procedure(s): RECTAL ULTRASOUND  SURGEON:  Surgeon(s): Leighton Ruff, MD  ANESTHESIA:   none  EBL: none  PATIENT DISPOSITION:  PACU - hemodynamically stable.   INDICATION: fecal incontinence  OR FINDINGS: intact sphincters  DESCRIPTION: The patient was identified in the holding area and taken to the procedure room where they were laid in lateral decubitus position on the exam room table.  A timeout was performed indicating the correct patient and procedure.  I began by performing a digital rectal exam.  There were no abnormal masses.   The US probe was inserted without difficulty.  The levators were identified and the probe was withdrawn slowly.  The internal sphincter was identified and followed distally.   The external sphincter was also identified and followed distally. There were no obvious defects in the external sphincter.  The internal sphincter did thin distally.    Distal thinning of int sphincter    External shincter    Int sphincter    Pelvic floor

## 2016-07-14 ENCOUNTER — Encounter (HOSPITAL_COMMUNITY): Payer: Self-pay | Admitting: General Surgery

## 2016-07-26 ENCOUNTER — Telehealth: Payer: Self-pay | Admitting: Pulmonary Disease

## 2016-07-26 NOTE — Telephone Encounter (Signed)
Notes Recorded by Rush Landmark, MD on 07/06/2016 at 3:42 PM EST Please call the patient that the chest CT scan shows scarring at the bottom of her lungs, mildly worse from 2016 and 2015. I can show it to her on follow-up in March. We can also do blood work in March to help determine the cause.   lmomtcb x 1

## 2016-07-28 NOTE — Telephone Encounter (Signed)
lmomtcb x 2  

## 2016-07-29 NOTE — Telephone Encounter (Signed)
lmomtcb x 3  

## 2016-08-01 NOTE — Telephone Encounter (Signed)
lmtcb for pt.

## 2016-08-02 ENCOUNTER — Other Ambulatory Visit: Payer: Medicare HMO

## 2016-08-02 NOTE — Telephone Encounter (Signed)
Unable to contact pt.  Will close this message per protocol.

## 2016-08-04 ENCOUNTER — Ambulatory Visit (HOSPITAL_BASED_OUTPATIENT_CLINIC_OR_DEPARTMENT_OTHER): Payer: Medicare HMO | Attending: Pulmonary Disease | Admitting: Pulmonary Disease

## 2016-08-04 DIAGNOSIS — G4710 Hypersomnia, unspecified: Secondary | ICD-10-CM | POA: Insufficient documentation

## 2016-08-05 ENCOUNTER — Other Ambulatory Visit (HOSPITAL_BASED_OUTPATIENT_CLINIC_OR_DEPARTMENT_OTHER): Payer: Medicare HMO

## 2016-08-05 DIAGNOSIS — Z85038 Personal history of other malignant neoplasm of large intestine: Secondary | ICD-10-CM

## 2016-08-05 DIAGNOSIS — C20 Malignant neoplasm of rectum: Secondary | ICD-10-CM

## 2016-08-05 LAB — COMPREHENSIVE METABOLIC PANEL
ALT: 12 U/L (ref 0–55)
AST: 14 U/L (ref 5–34)
Albumin: 3.3 g/dL — ABNORMAL LOW (ref 3.5–5.0)
Alkaline Phosphatase: 105 U/L (ref 40–150)
Anion Gap: 6 mEq/L (ref 3–11)
BUN: 19.1 mg/dL (ref 7.0–26.0)
CO2: 24 mEq/L (ref 22–29)
Calcium: 9.1 mg/dL (ref 8.4–10.4)
Chloride: 110 mEq/L — ABNORMAL HIGH (ref 98–109)
Creatinine: 1.2 mg/dL — ABNORMAL HIGH (ref 0.6–1.1)
EGFR: 55 mL/min/{1.73_m2} — ABNORMAL LOW (ref >90)
Glucose: 188 mg/dl — ABNORMAL HIGH (ref 70–140)
Potassium: 4.5 mEq/L (ref 3.5–5.1)
Sodium: 141 mEq/L (ref 136–145)
Total Bilirubin: 1.29 mg/dL — ABNORMAL HIGH (ref 0.20–1.20)
Total Protein: 7.4 g/dL (ref 6.4–8.3)

## 2016-08-05 LAB — CBC WITH DIFFERENTIAL/PLATELET
BASO%: 0 % (ref 0.0–2.0)
Basophils Absolute: 0 10*3/uL (ref 0.0–0.1)
EOS%: 1.8 % (ref 0.0–7.0)
Eosinophils Absolute: 0.1 10*3/uL (ref 0.0–0.5)
HCT: 32.4 % — ABNORMAL LOW (ref 34.8–46.6)
HGB: 11.6 g/dL (ref 11.6–15.9)
LYMPH%: 25.5 % (ref 14.0–49.7)
MCH: 32.6 pg (ref 25.1–34.0)
MCHC: 35.8 g/dL (ref 31.5–36.0)
MCV: 91 fL (ref 79.5–101.0)
MONO#: 0.3 10*3/uL (ref 0.1–0.9)
MONO%: 6.1 % (ref 0.0–14.0)
NEUT#: 2.9 10*3/uL (ref 1.5–6.5)
NEUT%: 66.6 % (ref 38.4–76.8)
Platelets: 177 10*3/uL (ref 145–400)
RBC: 3.56 10*6/uL — ABNORMAL LOW (ref 3.70–5.45)
RDW: 16.8 % — ABNORMAL HIGH (ref 11.2–14.5)
WBC: 4.4 10*3/uL (ref 3.9–10.3)
lymph#: 1.1 10*3/uL (ref 0.9–3.3)

## 2016-08-05 LAB — CEA (IN HOUSE-CHCC): CEA (CHCC-In House): 12.27 ng/mL — ABNORMAL HIGH (ref 0.00–5.00)

## 2016-08-06 LAB — CEA: CEA: 11.1 ng/mL — ABNORMAL HIGH (ref 0.0–4.7)

## 2016-08-09 ENCOUNTER — Telehealth: Payer: Self-pay | Admitting: *Deleted

## 2016-08-09 ENCOUNTER — Telehealth: Payer: Self-pay | Admitting: Hematology

## 2016-08-09 ENCOUNTER — Ambulatory Visit: Payer: Medicare HMO | Admitting: Hematology

## 2016-08-09 NOTE — Telephone Encounter (Signed)
lvm to inform pt of cxl appt for 2/27 and r/s to 3/5 due to CT scan not done yet. leftcontact number for central radiology for pt to return their call to sch scans asap

## 2016-08-09 NOTE — Telephone Encounter (Signed)
Attempted to call pt unsuccessfully.  Left message requesting a call back to collaborative nurse for CT scan info. Per Morey Hummingbird in radiology central scheduling, she had tried to contact pt several times without answers to schedule CT scan.  This nurse attempted to call both sister [name] and son Jeneen Rinks - also without answers, and no voice mail available.

## 2016-08-10 ENCOUNTER — Telehealth: Payer: Self-pay | Admitting: Pulmonary Disease

## 2016-08-10 NOTE — Procedures (Signed)
    NAME: SEQUOYAH RAMONE DATE OF BIRTH:  04-11-1956 MEDICAL RECORD NUMBER [id number]  LOCATION: [facility]  PHYSICIAN: [place] OF STUDY: 08/04/2016  SLEEP STUDY TYPE: Out of Center Sleep Test               CLINICAL INFORMATION  Sleep Study Type: NPSG  Indication for sleep study: Excessive Daytime Sleepiness  Epworth Sleepiness Score: 5   SLEEP STUDY TECHNIQUE  As per the AASM Manual for the Scoring of Sleep and Associated Events v2.3 (April 2016) with a hypopnea requiring 4% desaturations.  The channels recorded and monitored were frontal, central and occipital EEG, electrooculogram (EOG), submentalis EMG (chin), nasal and oral airflow, thoracic and abdominal wall motion, anterior tibialis EMG, snore microphone, electrocardiogram, and pulse oximetry.    MEDICATIONS  Medications self-administered by patient taken the night of the study : [place]. meds reviewed per chart review.  SLEEP ARCHITECTURE  The study was initiated at 10:29:52 PM and ended at 4:42:50 AM.  Sleep onset time was 107.8 minutes and the sleep efficiency was 56.0%. The total sleep time was 209.0 minutes.  Stage REM latency was 206.0 minutes.  The patient spent 5.26% of the night in stage N1 sleep, 67.22% in stage N2 sleep, 0.00% in stage N3 and 27.51% in REM.  Alpha intrusion was absent.  Supine sleep was 91.30%.   RESPIRATORY PARAMETERS  The overall apnea/hypopnea index (AHI) was 1.4 per hour. There were 5 total apneas, including 1 obstructive, 4 central and 0 mixed apneas. There were 0 hypopneas and 1 RERAs.  The AHI during Stage REM sleep was 5.2 per hour. AHI while supine was 1.6 per hour.  The mean oxygen saturation was 98.18%. The minimum SpO2 during sleep was 94.00%.  Loud snoring was noted during this study.  CARDIAC DATA  The 2 lead EKG demonstrated sinus rhythm. The mean heart rate was 52.72 beats per minute. Other EKG findings include: None.    LEG MOVEMENT DATA  The total PLMS were 378 with a resulting PLMS index of 108.52. Associated arousal with leg movement index was 22.1 .  IMPRESSIONS  1. No significant obstructive sleep apnea occurred during this study (AHI = 1.4/h). 2. No significant central sleep apnea occurred during this study (CAI = 1.1/h). 3. No significant oxygen desaturation was noted during this study (Min O2 = 94.00%). 4. The patient snored with Loud snoring volume. 5. No cardiac abnormalities were noted during this study. 6. Severe periodic limb movements of sleep occurred during the study. Associated arousals were significant. 7.  DIAGNOSIS  1. No evidence for significant OSA based on this study.  RECOMMENDATIONS  1. Avoid alcohol, sedatives and other CNS depressants that may worsen sleep apnea and disrupt normal sleep architecture. 2. Sleep hygiene should be reviewed to assess factors that may improve sleep quality. 3. Weight management and regular exercise should be initiated or continued if appropriate. 4. Follow up in the office to discuss results as scheduled.  Monica Becton, MD 08/10/2016, 2:34 AM [place] Pager [phone] After 3 pm or if no answer, call [phone]

## 2016-08-10 NOTE — Telephone Encounter (Signed)
lmom tcb x1

## 2016-08-10 NOTE — Telephone Encounter (Signed)
  Please call the pt and tell the pt the LAB SLEEP STUDY did not show OSA. There were some events but over all it was normal. No sleep apnea seen.   Follow up as scheduled  Thanks!   J. Shirl Harris, MD 08/10/2016, 2:36 AM

## 2016-08-11 ENCOUNTER — Ambulatory Visit: Payer: Medicare HMO | Admitting: Pulmonary Disease

## 2016-08-11 ENCOUNTER — Ambulatory Visit (INDEPENDENT_AMBULATORY_CARE_PROVIDER_SITE_OTHER): Payer: Medicare HMO | Admitting: Pulmonary Disease

## 2016-08-11 ENCOUNTER — Telehealth: Payer: Self-pay | Admitting: *Deleted

## 2016-08-11 DIAGNOSIS — J449 Chronic obstructive pulmonary disease, unspecified: Secondary | ICD-10-CM

## 2016-08-11 LAB — PULMONARY FUNCTION TEST
DL/VA % pred: 66 %
DL/VA: 3.18 ml/min/mmHg/L
DLCO cor % pred: 60 %
DLCO cor: 14.73 ml/min/mmHg
DLCO unc % pred: 61 %
DLCO unc: 14.81 ml/min/mmHg
FEF 25-75 Post: 1.72 L/sec
FEF 25-75 Pre: 0.95 L/sec
FEF2575-%Change-Post: 81 %
FEF2575-%Pred-Post: 86 %
FEF2575-%Pred-Pre: 47 %
FEV1-%Change-Post: 16 %
FEV1-%Pred-Post: 98 %
FEV1-%Pred-Pre: 84 %
FEV1-Post: 2.01 L
FEV1-Pre: 1.72 L
FEV1FVC-%Change-Post: 0 %
FEV1FVC-%Pred-Pre: 84 %
FEV6-%Change-Post: 17 %
FEV6-%Pred-Post: 117 %
FEV6-%Pred-Pre: 100 %
FEV6-Post: 2.95 L
FEV6-Pre: 2.51 L
FEV6FVC-%Change-Post: 0 %
FEV6FVC-%Pred-Post: 101 %
FEV6FVC-%Pred-Pre: 101 %
FVC-%Change-Post: 16 %
FVC-%Pred-Post: 114 %
FVC-%Pred-Pre: 98 %
FVC-Post: 2.99 L
FVC-Pre: 2.56 L
Post FEV1/FVC ratio: 67 %
Post FEV6/FVC ratio: 99 %
Pre FEV1/FVC ratio: 67 %
Pre FEV6/FVC Ratio: 98 %
RV % pred: 136 %
RV: 2.77 L
TLC % pred: 105 %
TLC: 5.31 L

## 2016-08-11 NOTE — Telephone Encounter (Signed)
Spoke with pt today and gave pt phone number to radiology scheduling dept to schedule CT scans prior to office visit on 3/15 with Dr. Burr Medico.   Gave pt date and time for lab on Wed 08/17/16.  Pt voiced understanding and stated she would contact radiology scheduling now.

## 2016-08-12 ENCOUNTER — Ambulatory Visit (INDEPENDENT_AMBULATORY_CARE_PROVIDER_SITE_OTHER): Payer: Medicare HMO | Admitting: Pulmonary Disease

## 2016-08-12 ENCOUNTER — Encounter: Payer: Self-pay | Admitting: Pulmonary Disease

## 2016-08-12 ENCOUNTER — Other Ambulatory Visit (INDEPENDENT_AMBULATORY_CARE_PROVIDER_SITE_OTHER): Payer: Medicare HMO

## 2016-08-12 DIAGNOSIS — J439 Emphysema, unspecified: Secondary | ICD-10-CM | POA: Diagnosis not present

## 2016-08-12 DIAGNOSIS — R05 Cough: Secondary | ICD-10-CM | POA: Diagnosis not present

## 2016-08-12 DIAGNOSIS — J849 Interstitial pulmonary disease, unspecified: Secondary | ICD-10-CM

## 2016-08-12 DIAGNOSIS — K219 Gastro-esophageal reflux disease without esophagitis: Secondary | ICD-10-CM

## 2016-08-12 DIAGNOSIS — R059 Cough, unspecified: Secondary | ICD-10-CM

## 2016-08-12 LAB — SEDIMENTATION RATE: Sed Rate: 14 mm/hr (ref 0–30)

## 2016-08-12 LAB — C-REACTIVE PROTEIN: CRP: 0.6 mg/dL (ref 0.5–20.0)

## 2016-08-12 LAB — APTT: aPTT: 26.7 s (ref 23.4–32.7)

## 2016-08-12 NOTE — Progress Notes (Signed)
Subjective:    Patient ID: Erica [name], female    DOB: 1955-10-21, 61 y.o.   MRN: [id number]  HPI   This is the case of Erica [name], 61 y.o. Female, who was referred by Dr. Lorella Nimrod in consultation regarding abnormal chest ct scan.   As you very well know, patient smoked 1 pack per week since her 31s, curently down to a cigarette a day, was diagnosed with asthma/copd several yrs ago. Uses alb MDI prn, usually 1-2 x/week. She gets more than SOB with more than ADLs. She is not on o2.   She had rectal CA in 2014 for which she had surgery. Still in remission.  She had a chest ct scan in 01/2015 as part of CA surveillance.  That showed several pulm nodules (subcm) which have been stable, and some smaller.  Also with increased markings at the bases.   She had sinus issues in December.  Wen tot ER as she was not getting better.  CXR showed inc interstitial markings, hence the consult today. She is almost back to her baseline.   She has GERD, controlled with meds. She coughs daily, coughs in sleep. Cough worse at night.  On and off wheezing at HS.   Her sinus issues are better. Has PND.    Has snoring, gaspin, choking, unrefreshed sleep.  Wakes up tired and fatigued.  ESS 10. Hypersomnia affects her fxnality.    ROV  08/12/16 Patient returns to the office as follow-up after her tests. Since last seen, she has quit smoking. She had PFT  which showed mild COPD with an FEV1 of 1. 72 or 84% of predicted. Diffusion capacity was 61%. Her sleep study was (-) for OSA. She is able to do her ADLs without much difficulty. Gets winded with more than ADLs. Her reflux is controlled with medicines and diet changes. Her cough is also stable.   Review of Systems  Constitutional: Positive for unexpected weight change. Negative for fever.  HENT: Positive for congestion, postnasal drip and sneezing. Negative for dental problem, ear pain, nosebleeds, rhinorrhea, sinus pressure, sore throat and trouble  swallowing.   Eyes: Negative.  Negative for redness and itching.  Respiratory: Positive for cough, shortness of breath and wheezing. Negative for chest tightness.   Cardiovascular: Negative.  Negative for palpitations and leg swelling.  Gastrointestinal: Negative.  Negative for nausea and vomiting.  Genitourinary: Negative.  Negative for dysuria.  Musculoskeletal: Negative.  Negative for joint swelling.  Skin: Negative.  Negative for rash.  Neurological: Positive for headaches.  Hematological: Negative.  Does not bruise/bleed easily.  Psychiatric/Behavioral: Negative.  Negative for dysphoric mood. The patient is not nervous/anxious.            Objective:   Physical Exam  Vitals:  Vitals:   08/12/16 1336  BP: (!) 132/100  Pulse: 87  SpO2: 98%  Weight: 174 lb (78.9 kg)  Height: _0  (1.6 m)    Constitutional/General:  Pleasant, well-nourished, well-developed, not in any distress,  Comfortably seating.  Well kempt  Body mass index is 30.82 kg/m. Wt Readings from Last 3 Encounters:  08/12/16 174 lb (78.9 kg)  08/04/16 174 lb (78.9 kg)  07/13/16 (P) 178 lb (80.7 kg)    HEENT: Pupils equal and reactive to light and accommodation. Anicteric sclerae. Normal nasal mucosa.   No oral  lesions,  mouth clear,  oropharynx clear, no postnasal drip. (-) Oral thrush. No dental caries.  Airway - Mallampati class III  Neck: No masses. Midline trachea. No JVD, (-) LAD. (-) bruits appreciated.  Respiratory/Chest: Grossly normal chest. (-) deformity. (-) Accessory muscle use.  Symmetric expansion. (-) Tenderness on palpation.  Resonant on percussion.  Diminished BS on both lower lung zones. (-) wheezing,  rhonchi . Occasional bibasilar crackles.  (-) egophony  Cardiovascular: Regular rate and  rhythm, heart sounds normal, no murmur or gallops, no peripheral edema  Gastrointestinal:  Normal bowel sounds. Soft, non-tender. No hepatosplenomegaly.  (-) masses.   Musculoskeletal:   Normal muscle tone. Normal gait.   Extremities: Grossly normal. (-) clubbing, cyanosis.  (-) edema  Skin: (-) rash,lesions seen.   Neurological/Psychiatric : alert, oriented to time, place, person. Normal mood and affect          Assessment & Plan:  Interstitial lung disease [facility]) Patient with recent  sinus issues necessitating ER visit in December 2017. Chest x-ray done in December 2017 showed increased interstitial markings at the bottom. Patient known to have rectal cancer, in remission. She had a chest CT scan in August 2016 which showed several subcentimeter pulmonary nodules, bilateral as well as some atelectasis/mild thickening of the interstitium at the bases.  HRCT chest done on 07/06/16 showed changes c/w IPF/UIP pattern, mildly worse that 2016 and 2015 ct scan.   Plan :  I extensively discussed with the patient the  findings on the CXR/Chest CT scan.  I discussed the consideration of Interstitial Lung Disease (ILD) and the need for extensive work up.   We will obtain the following blood work:  ESR, CRP ANA, anti DS DNA Ab RF,  Anti CCP Ab CK total Anti Jo Ab ANCA (C-ANCA, P-ANCA) Anti Scl Ab  We will hold off on biopsy as her chest CT scan is typical of IPF. I discussed with patient regarding treatment such as pirfenidone. She wanted to hold off. I discussed with her side effects of medicine. I agree with her holding off on the medicines as she is not too symptomatic. Medicines might cause more harm than good. I told her however, if she gets more symptomatic, we need to start her on medicines.  I mentioned to her to importance of having reflux controlled as untreated reflux can make IPF worse.  Her sinus issues are also controlled.  May need an HRCT in 6-12 mos as f/u, sooner if she is more symptomatic.      COPD (chronic obstructive pulmonary disease) ([place]) Patient with one pack per week smoking history since her 53s. Down to a cigarette a day. Was  diagnosed with asthma/COPD several years ago. Uses albuterol when necessary, usually 1-2 times a week. Her dyspnea is not significantly worse. She is functional.  Pt has quit smoking since last seen!  Plan : 1. She is happy with her current exertional dyspnea being controlled. She uses albuterol when necessary. I told her to give [place] a call if she gets more symptomatic. At that point, she'll probably need Symbicort or Dulera or breo.  2.  Will need alpha one on 08/12/16 3. She had a pneumonia vaccine last year. She got it from her pharmacy. She will call pharmacy to determine which one she got. She will need the other pneumonia vaccine once she finds out which vaccine she got. Yearly influenza vaccine.  GERD (gastroesophageal reflux disease) Cont Protonix at bedtime. Head of bed 30. Dietary changes. May need Zantac 150 twice a day if not better.  Cough Related to upper airway cough syndrome and reflux disease. Cough usually  worse at night. Rx GERD. Cont Flonase,  singulair 10 mg/tab, 1 tab at [facility]      Patient will follow up with [place]  in 6 mos with NP.     Monica Becton, MD 08/12/2016   2:12 PM [facility] [place] Pager: [phone] Office: [phone], Fax: [phone]

## 2016-08-12 NOTE — Assessment & Plan Note (Addendum)
Patient with recent  sinus issues necessitating ER visit in December 2017. Chest x-ray done in December 2017 showed increased interstitial markings at the bottom. Patient known to have rectal cancer, in remission. She had a chest CT scan in August 2016 which showed several subcentimeter pulmonary nodules, bilateral as well as some atelectasis/mild thickening of the interstitium at the bases.  HRCT chest done on 07/06/16 showed changes c/w IPF/UIP pattern, mildly worse that 2016 and 2015 ct scan.   Plan :  I extensively discussed with the patient the  findings on the CXR/Chest CT scan.  I discussed the consideration of Interstitial Lung Disease (ILD) and the need for extensive work up.   We will obtain the following blood work:  ESR, CRP ANA, anti DS DNA Ab RF,  Anti CCP Ab CK total Anti Jo Ab ANCA (C-ANCA, P-ANCA) Anti Scl Ab  We will hold off on biopsy as her chest CT scan is typical of IPF. I discussed with patient regarding treatment such as pirfenidone. She wanted to hold off. I discussed with her side effects of medicine. I agree with her holding off on the medicines as she is not too symptomatic. Medicines might cause more harm than good. I told her however, if she gets more symptomatic, we need to start her on medicines.  I mentioned to her to importance of having reflux controlled as untreated reflux can make IPF worse.  Her sinus issues are also controlled.  May need an HRCT in 6-12 mos as f/u, sooner if she is more symptomatic.

## 2016-08-12 NOTE — Assessment & Plan Note (Addendum)
Related to upper airway cough syndrome and reflux disease. Cough usually worse at night. Rx GERD. Cont Flonase,  singulair 10 mg/tab, 1 tab at HS

## 2016-08-12 NOTE — Assessment & Plan Note (Addendum)
Patient with one pack per week smoking history since her 34s. Down to a cigarette a day. Was diagnosed with asthma/COPD several years ago. Uses albuterol when necessary, usually 1-2 times a week. Her dyspnea is not significantly worse. She is functional.  Pt has quit smoking since last seen!  Plan : 1. She is happy with her current exertional dyspnea being controlled. She uses albuterol when necessary. I told her to give [place] a call if she gets more symptomatic. At that point, she'll probably need Symbicort or Dulera or breo.  2.  Will need alpha one on 08/12/16 3. She had a pneumonia vaccine last year. She got it from her pharmacy. She will call pharmacy to determine which one she got. She will need the other pneumonia vaccine once she finds out which vaccine she got. Yearly influenza vaccine.

## 2016-08-12 NOTE — Telephone Encounter (Signed)
Pt came into the office today 08/12/16, and results were given to her by AD. Nothing further is needed at this time.

## 2016-08-12 NOTE — Patient Instructions (Signed)
It was a pleasure taking care of you today!  You are diagnosed with Interstitial Lung Disease (ILD).  This usually causes swelling and/or scarring of your lungs causing you to be short of breath, have cough, or have low oxygen level.  You also have COPD, GERD.   We will do blood work today.  Please make sure you take your medications on a regular basis.  Your medications include the following: Albuterol 2 puffs every 4 hrs as needed for shortness of breath. Take Protonix at bedtime.   Please call the office if your symptoms worsen. You may have a flare up of ILD and this may require steroids and antibiotics.   Please make sure your vaccines are up to date.   Return to clinic in 6 months.

## 2016-08-12 NOTE — Assessment & Plan Note (Addendum)
Cont Protonix at bedtime. Head of bed 30. Dietary changes. May need Zantac 150 twice a day if not better.

## 2016-08-13 LAB — CK TOTAL AND CKMB (NOT AT ARMC)
CK, MB: 0.8 ng/mL (ref 0.0–5.0)
Relative Index: 1.2 (ref 0.0–4.0)
Total CK: 68 U/L (ref 7–177)

## 2016-08-13 LAB — ANTI-JO 1 ANTIBODY, IGG: Anti JO-1: <0.2 AI (ref 0.0–0.9)

## 2016-08-15 ENCOUNTER — Ambulatory Visit: Payer: Medicare HMO | Admitting: Hematology

## 2016-08-15 LAB — ANA: Anti Nuclear Antibody(ANA): NEGATIVE

## 2016-08-15 LAB — CYCLIC CITRUL PEPTIDE ANTIBODY, IGG: Cyclic Citrullin Peptide Ab: <16 Units

## 2016-08-16 LAB — ANTI-DNA ANTIBODY, DOUBLE-STRANDED: ds DNA Ab: <1 IU/mL

## 2016-08-16 LAB — ANTI-SCLERODERMA ANTIBODY: Scleroderma (Scl-70) (ENA) Antibody, IgG: <1

## 2016-08-16 LAB — ANCA SCREEN W REFLEX TITER: ANCA Screen: POSITIVE — AB

## 2016-08-16 LAB — C-ANCA TITER: C-ANCA: 1:40 {titer} — ABNORMAL HIGH

## 2016-08-17 ENCOUNTER — Other Ambulatory Visit (HOSPITAL_BASED_OUTPATIENT_CLINIC_OR_DEPARTMENT_OTHER): Payer: Medicare HMO

## 2016-08-17 ENCOUNTER — Ambulatory Visit (HOSPITAL_COMMUNITY)
Admission: RE | Admit: 2016-08-17 | Discharge: 2016-08-17 | Disposition: A | Discharge status: Home / Self Care | Payer: Medicare HMO | Source: Ambulatory Visit | Attending: Hematology | Admitting: Hematology

## 2016-08-17 DIAGNOSIS — K8020 Calculus of gallbladder without cholecystitis without obstruction: Secondary | ICD-10-CM | POA: Diagnosis not present

## 2016-08-17 DIAGNOSIS — C20 Malignant neoplasm of rectum: Secondary | ICD-10-CM

## 2016-08-17 DIAGNOSIS — I700 Atherosclerosis of aorta: Secondary | ICD-10-CM | POA: Diagnosis not present

## 2016-08-17 DIAGNOSIS — Z85038 Personal history of other malignant neoplasm of large intestine: Secondary | ICD-10-CM | POA: Diagnosis not present

## 2016-08-17 DIAGNOSIS — K5730 Diverticulosis of large intestine without perforation or abscess without bleeding: Secondary | ICD-10-CM | POA: Diagnosis not present

## 2016-08-17 LAB — COMPREHENSIVE METABOLIC PANEL
ALT: 8 U/L (ref 0–55)
AST: 14 U/L (ref 5–34)
Albumin: 3.4 g/dL — ABNORMAL LOW (ref 3.5–5.0)
Alkaline Phosphatase: 132 U/L (ref 40–150)
Anion Gap: 6 mEq/L (ref 3–11)
BUN: 19 mg/dL (ref 7.0–26.0)
CO2: 26 mEq/L (ref 22–29)
Calcium: 9 mg/dL (ref 8.4–10.4)
Chloride: 106 mEq/L (ref 98–109)
Creatinine: 1.3 mg/dL — ABNORMAL HIGH (ref 0.6–1.1)
EGFR: 51 mL/min/{1.73_m2} — ABNORMAL LOW (ref >90)
Glucose: 202 mg/dl — ABNORMAL HIGH (ref 70–140)
Potassium: 4.4 mEq/L (ref 3.5–5.1)
Sodium: 137 mEq/L (ref 136–145)
Total Bilirubin: 1.23 mg/dL — ABNORMAL HIGH (ref 0.20–1.20)
Total Protein: 7.6 g/dL (ref 6.4–8.3)

## 2016-08-17 LAB — CBC WITH DIFFERENTIAL/PLATELET
BASO%: 0.7 % (ref 0.0–2.0)
Basophils Absolute: 0 10*3/uL (ref 0.0–0.1)
EOS%: 3.1 % (ref 0.0–7.0)
Eosinophils Absolute: 0.1 10*3/uL (ref 0.0–0.5)
HCT: 33.9 % — ABNORMAL LOW (ref 34.8–46.6)
HGB: 11.7 g/dL (ref 11.6–15.9)
LYMPH%: 25.7 % (ref 14.0–49.7)
MCH: 33.1 pg (ref 25.1–34.0)
MCHC: 34.7 g/dL (ref 31.5–36.0)
MCV: 95.4 fL (ref 79.5–101.0)
MONO#: 0.3 10*3/uL (ref 0.1–0.9)
MONO%: 7.4 % (ref 0.0–14.0)
NEUT#: 2.5 10*3/uL (ref 1.5–6.5)
NEUT%: 63.1 % (ref 38.4–76.8)
Platelets: 183 10*3/uL (ref 145–400)
RBC: 3.55 10*6/uL — ABNORMAL LOW (ref 3.70–5.45)
RDW: 15.3 % — ABNORMAL HIGH (ref 11.2–14.5)
WBC: 3.9 10*3/uL (ref 3.9–10.3)
lymph#: 1 10*3/uL (ref 0.9–3.3)

## 2016-08-17 LAB — CEA (IN HOUSE-CHCC): CEA (CHCC-In House): 14.56 ng/mL — ABNORMAL HIGH (ref 0.00–5.00)

## 2016-08-17 IMAGING — CT CT ABD-PELV W/O CM
2 of 4 series · 16 of 46 positions shown, 18 images · non-contrast
Comparison: [DATE] high-resolution chest CT. [DATE] PET-CT.

CLINICAL DATA: Stage I rectal carcinoma diagnosed [8T] status post
rectosigmoid resection [DATE], presenting for restaging on
surveillance.

EXAM:
CT ABDOMEN AND PELVIS WITHOUT CONTRAST
TECHNIQUE: Multidetector CT imaging of the abdomen and pelvis was performed
following the standard protocol without IV contrast.

[Series 2: axial st · axial · 0.96mm/px · z∈[+1088,+1452]mm · 13 of 83 slices shown, 15 images]
[im 5/83  soft-tissue]
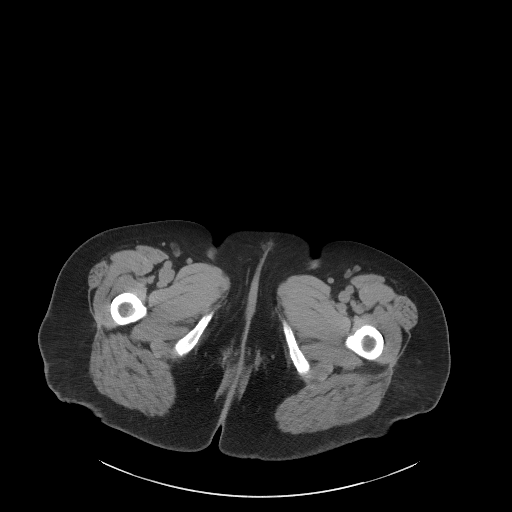
[im 5/83  bone]
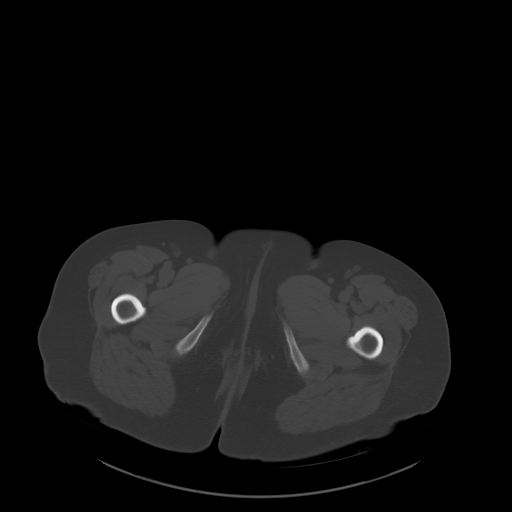
[im 13/83  soft-tissue]
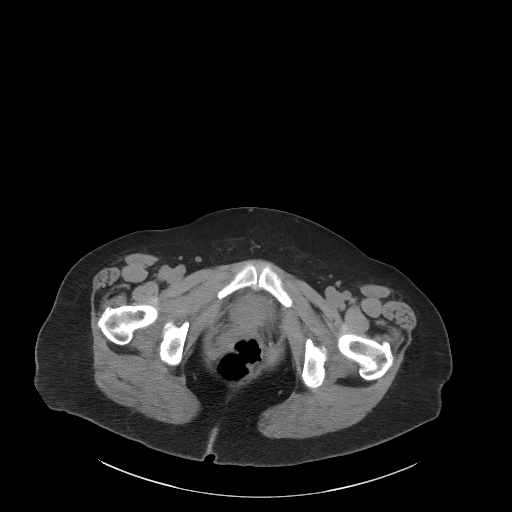
[im 17/83  soft-tissue]
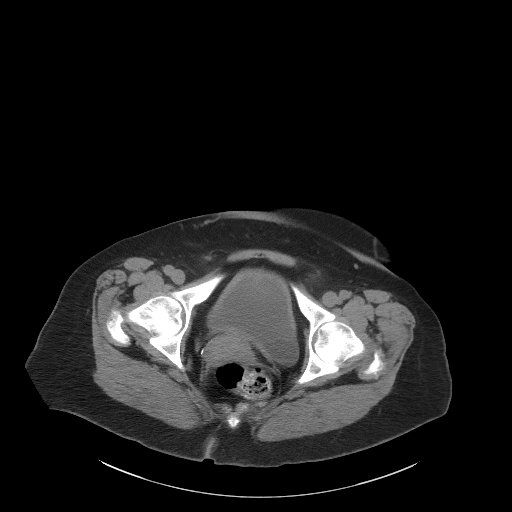
[im 25/83  soft-tissue]
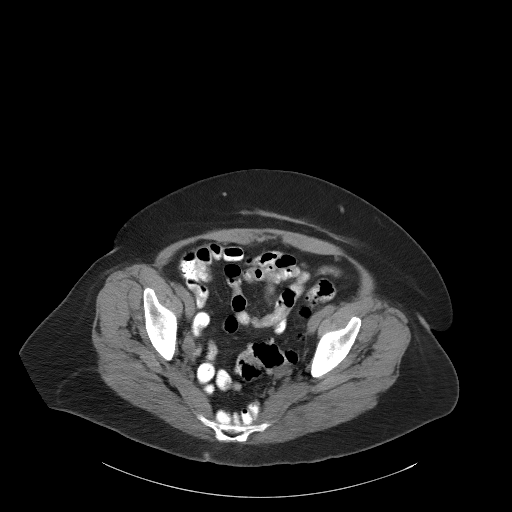
[im 29/83  soft-tissue]
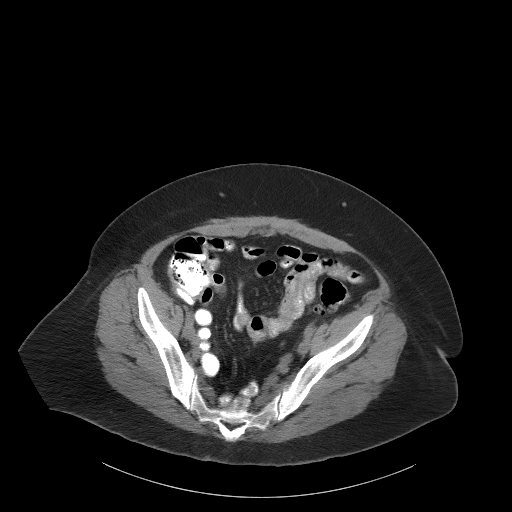
[im 37/83  soft-tissue]
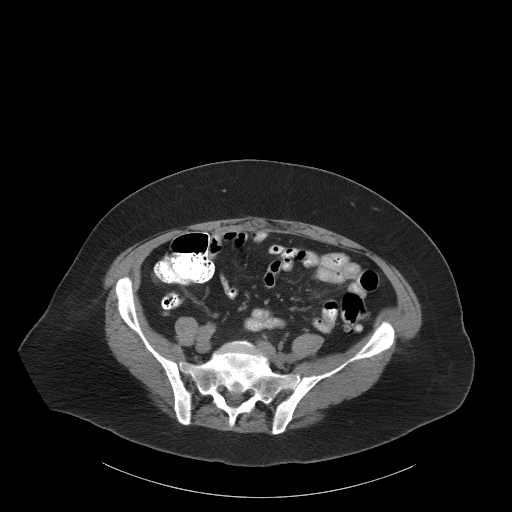
[im 42/83  soft-tissue]
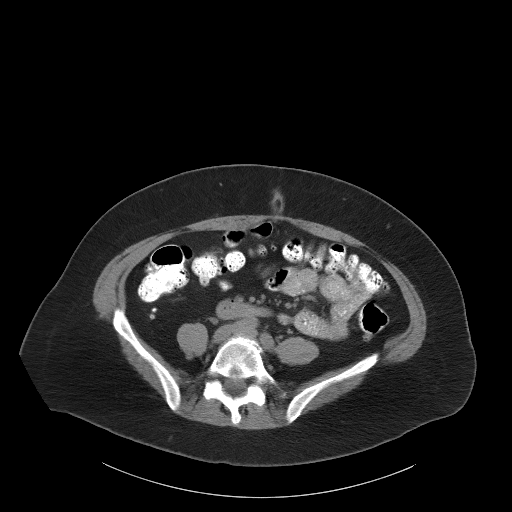
[im 46/83  soft-tissue]
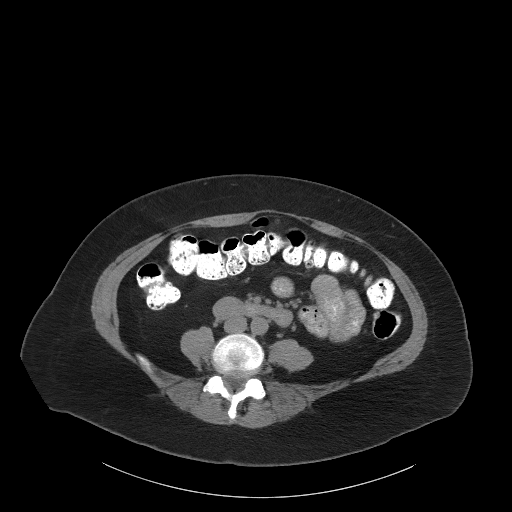
[im 54/83  soft-tissue]
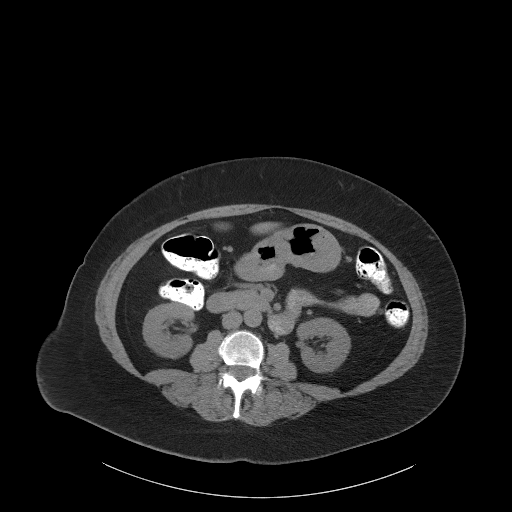
[im 54/83  bone]
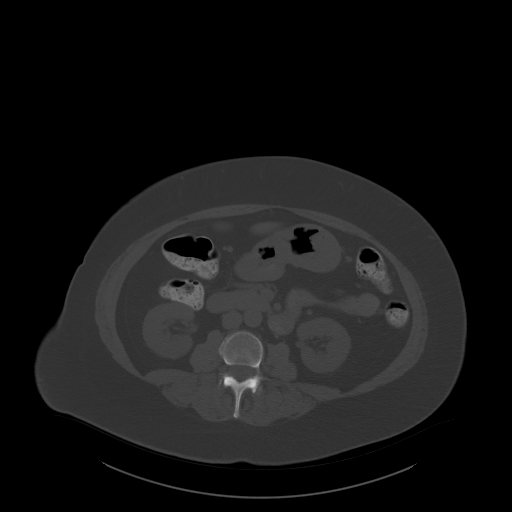
[im 58/83  soft-tissue]
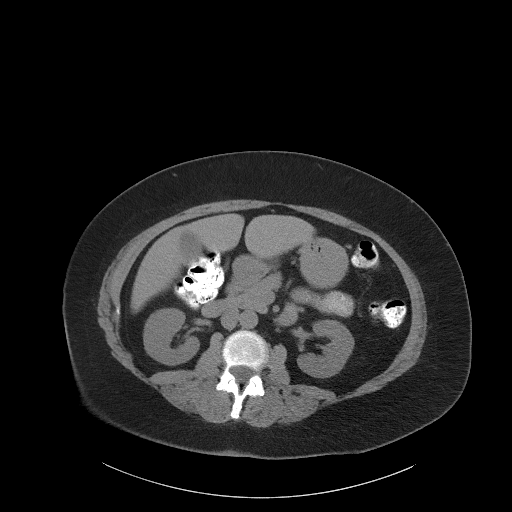
[im 66/83  soft-tissue]
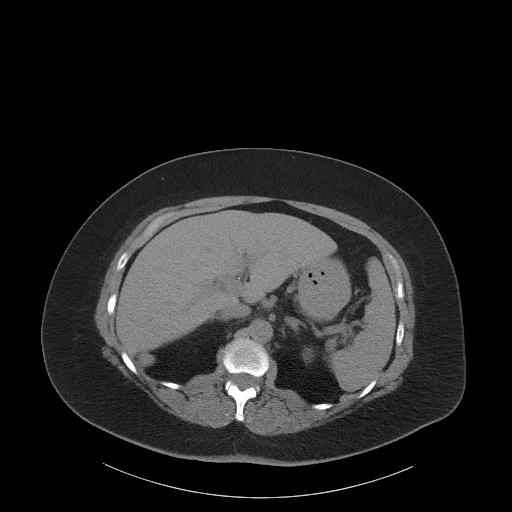
[im 70/83  soft-tissue]
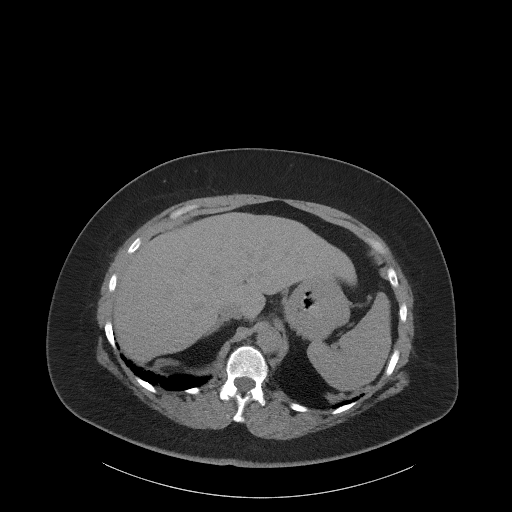
[im 78/83  soft-tissue]
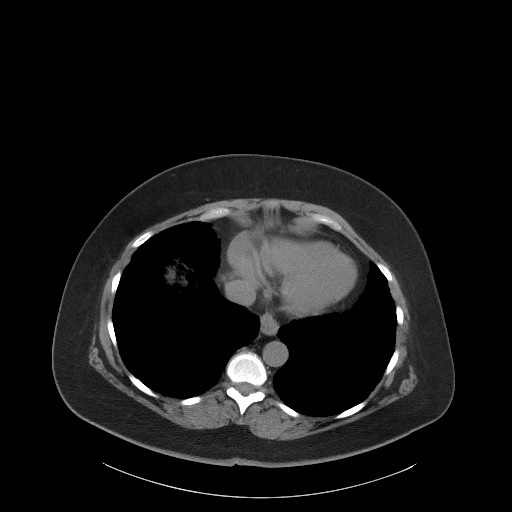

[Series 5: coronal st · coronal · 0.73mm/px · 3 of 101 slices shown]
[im 34/101  soft-tissue]
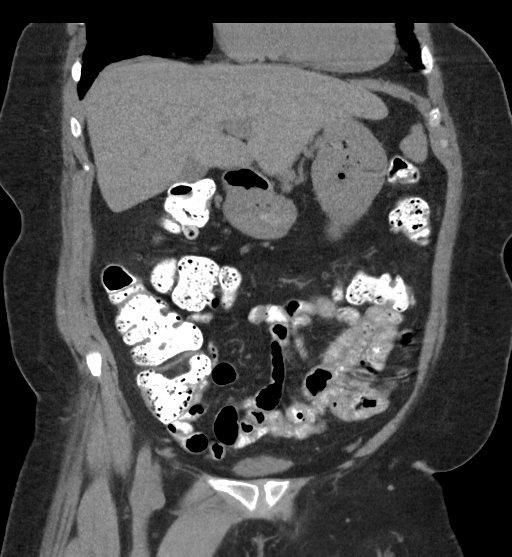
[im 45/101  soft-tissue]
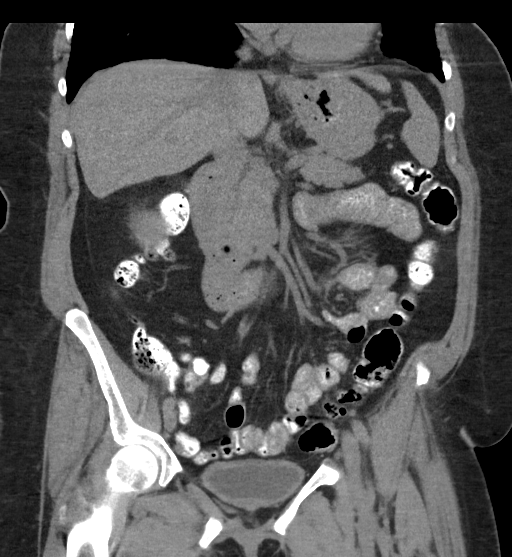
[im 56/101  soft-tissue]
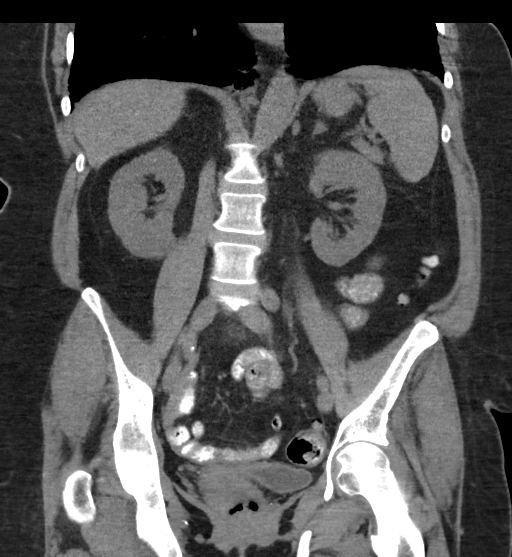

[16 of 46 positions shown; findings below may reference images not displayed]

FINDINGS: Lower chest: Patchy subpleural reticulation and ground-glass
attenuation at both lung bases, not convincingly changed since
[DATE]. No significant pulmonary nodules at the visualized lung
bases.

Hepatobiliary: Normal liver with no liver mass. Cholelithiasis, with
no gallbladder distention, gallbladder wall thickening or
pericholecystic fluid. No biliary ductal dilatation.

Pancreas: Normal, with no mass or duct dilation.

Spleen: Normal size. No mass.

Adrenals/Urinary Tract: Stable appearance of the adrenal glands with
no discrete nodules . No renal stones. No hydronephrosis. No contour
deforming renal mass. Relatively collapsed and grossly normal
bladder with no definite bladder wall thickening.

Stomach/Bowel: Collapsed and grossly normal stomach. Normal caliber
small bowel with no small bowel wall thickening. Normal appendix.
Stable postsurgical changes from subtotal rectosigmoid resection
with intact appearing rectosigmoid anastomosis, with no mass or wall
thickening at the colorectal anastomosis. Oral contrast progresses
to the distal rectum. Scattered mild colonic diverticulosis, most
prominent in the sigmoid colon, with no large bowel wall thickening
or pericolonic fat stranding.

Vascular/Lymphatic: Mildly atherosclerotic nonaneurysmal abdominal
aorta. No pathologically enlarged lymph nodes in the abdomen or
pelvis.

Reproductive: Grossly normal uterus.  No adnexal mass.

Other: No pneumoperitoneum, ascites or focal fluid collection.

Musculoskeletal: No aggressive appearing focal osseous lesions.
Moderate thoracolumbar spondylosis.
IMPRESSION: 1. No evidence of local tumor recurrence at the colorectal
anastomosis.
2. No evidence of metastatic disease in the abdomen or pelvis.
3. No appreciable interval change in interstitial lung disease at
the lung bases.
4. Additional findings include aortic atherosclerosis,
cholelithiasis and mild colonic diverticulosis.

## 2016-08-18 LAB — ALPHA-1 ANTITRYPSIN PHENOTYPE: A-1 Antitrypsin: 120 mg/dL (ref 83–199)

## 2016-08-23 NOTE — Progress Notes (Signed)
[place]  [phone] [phone]  Clinic follow up Note   Patient Care Team: Nolene Ebbs, MD as PCP - General (Internal Medicine) Truitt Merle, MD as Consulting Physician (Hematology) Leighton Ruff, MD as Consulting Physician (General Surgery) Arta Silence, MD as Consulting Physician (Gastroenterology) 08/25/2016  CHIEF COMPLAINTS:  History of rectal cancer, elevated CEA    Oncology History   Rectal cancer   Staging form: Colon and Rectum, AJCC 7th Edition     Pathologic: Stage I (T1, N0, cM0) - Signed by Truitt Merle, MD on 09/26/2014       Rectal cancer ([place])   09/13/2012 Procedure    Colonoscopy & endorectal ultrasound-Dr. Outlaw=Distal colonic polyp      11/14/2012 Procedure    Sigmoidoscopy & Laparoscopy=Dr. Donne Hazel      01/29/2013 Pathologic Stage    pT1N0cM0, stage I, well-differentiated adenocarcinoma arising in background of tubular villous adenoma, invading into the submucosa. No evidence of angiolymphatic invasion. 18 lymph nodes were negative.      01/29/2013 Surgery    Rectosigmoid segmental resection by Dr. Marcello Moores. Surgical margin was negative.      02/04/2013 Initial Diagnosis    Rectal cancer      03/11/2014 Imaging    CT C/A/P=No acute abdominal/pelvic findings, mass lesions or adenopathy. No findings to suggest recurrent rectal cancer and no adenopathy or metastatic disease. 2. Emphysematous changes. 3. Suspect early interstitial lung disease. 4. Tortuous and mil      09/12/2014 Tumor Marker    CEA=6.4      10/14/2014 Imaging    PET showed new and enlarging vague pulmonary nodules bilaterally worrisome for metastatic disease, no other evidence of cancer recurrence       12/09/2014 Tumor Marker    7.5      01/12/2015 Imaging    CT chest showed no significant change in small bilateral subcentimeter solid pulmonary nodules, which are likely benign. Chronic interstitial lung disease.      01/12/2015 Tumor Marker   CEA 8.1      07/13/2016 - 07/13/2016 Hospital Admission    Admit date: 07/13/2016  Patient presented to ED with complain of fecal incontinence      08/17/2016 Imaging     CT A/P WO CONTRAST IMPRESSION: 1. No evidence of local tumor recurrence at the colorectal anastomosis. 2. No evidence of metastatic disease in the abdomen or pelvis. 3. No appreciable interval change in interstitial lung disease at the lung bases. 4. Additional findings include aortic atherosclerosis, cholelithiasis and mild colonic diverticulosis.       HISTORY OF PRESENTING ILLNESS:  Erica [name] 61 y.o. female is here because of her history of stage I rectal cancer and recent abnormal CEA. She presents with her sister to the clinic today.   Her rectal cancer was diagnosed in August 2014 by screening colonoscopy. She was found to have multiple colon polyps in April 2014, which were removed by colonoscopy and pathology showedtubular or tubulovillous adenoma, and a rectal polyp biopsy showed high-grade dysplasia. She underwent LAR on 01/29/2013, surgical pathology showed invasive well-differentiated adenocarcinoma, pT1N0, 8 lymph nodes were negative. She has been followed by Dr. Marcello Moores since then. Her last CTs scan was in September 2015 which was negative for disease recurrence, and colonoscopy on 02/04/2014 showed no evidence of disease recurrence, except 3 colon polyps which were removed. Her CEA however on 09/12/2014 was elevated at 6.4. She was referred by Dr. Marcello Moores for further evaluation.  She has some residual low  abdominal pain since the surgery, she has loose BM 2-4 times daily, no blood, appetite and energy level is good, no significant change lately, except low back pain for the past 6 month, which is 6/10, no radiation pain. No recently weight loss.   CURRENT THERAPY: Surveillance  INTERIM HISTORY: Kaleigha returns for follow-up.Patient is feeling good but has discovered she has gallbladder stones. She had  ultrasounds done. Breathing is breather, has a cough which is dry on some days. Blood counts are normal.  MEDICAL HISTORY:  Past Medical History:  Diagnosis Date  . Arthritis    especially in shoulders  . Asthma   . Depression   . Diabetes mellitus   . GERD (gastroesophageal reflux disease)   . Headache(784.0)    "mild"  . Hypertension   . Mental disorder    depression  . Neuropathy ([facility])    feet   . Pain    arthritis pain - takes tramadol as needed  . Rectal polyp    very little bleeding with bowel movements- no pain    SURGICAL HISTORY: Past Surgical History:  Procedure Laterality Date  . ANAL RECTAL MANOMETRY N/A 07/13/2016   Procedure: ANO RECTAL MANOMETRY;  Surgeon: Leighton Ruff, MD;  Location: [facility];  Service: Endoscopy;  Laterality: N/A;  . CESAREAN SECTION    . EUS N/A 11/21/2012   Procedure: LOWER ENDOSCOPIC ULTRASOUND (EUS);  Surgeon: Arta Silence, MD;  Location: [facility];  Service: Endoscopy;  Laterality: N/A;  . FLEXIBLE SIGMOIDOSCOPY N/A 11/21/2012   Procedure: FLEXIBLE SIGMOIDOSCOPY;  Surgeon: Arta Silence, MD;  Location: [facility];  Service: Endoscopy;  Laterality: N/A;  . FLEXIBLE SIGMOIDOSCOPY N/A 01/28/2013   Procedure: FLEXIBLE SIGMOIDOSCOPY;  Surgeon: Leighton Ruff, MD;  Location: [facility];  Service: Endoscopy;  Laterality: N/A;  . LAPAROSCOPIC LOW ANTERIOR RESECTION N/A 01/29/2013   Procedure: LAPAROSCOPIC LOW ANTERIOR RESECTION, Rigid Proctoscopy;  Surgeon: Leighton Ruff, MD;  Location: [facility];  Service: General;  Laterality: N/A;  . LAPAROSCOPIC SIGMOID COLECTOMY N/A 11/14/2012   Procedure: DIAGNOSTIC LAPAROSCOPY AND SIGMOIDMOIDOSCOPY ;  Surgeon: Rolm Bookbinder, MD;  Location: [facility];  Service: General;  Laterality: N/A;  . RECTAL ULTRASOUND N/A 07/13/2016   Procedure: RECTAL ULTRASOUND;  Surgeon: Leighton Ruff, MD;  Location: [facility];  Service: Endoscopy;  Laterality: N/A;  . TONSILLECTOMY    . TONSILLECTOMY AND ADENOIDECTOMY       SOCIAL HISTORY: History   Social History  . Marital Status: Single    Spouse Name: N/A  . Number of Children: One son, 70   . Years of Education: N/A   Occupational History  . Disabled due to depression, DM    Social History Main Topics  . Smoking status: Current Every Day Smoker -- 0.25 packs/day for 20 years    Types: Cigarettes  . Smokeless tobacco: Never Used     Comment: one every 2-3 days  . Alcohol Use: 0.0 oz/week     Comment: 3 cans beer a week      pt states crack cocaine was 5 or 6 yrs ago   . Drug Use: Yes    Special: "Crack" cocaine     Comment: last time was a year ago   . Sexual Activity: No   Other Topics Concern  . Not on file   Social History Narrative    FAMILY HISTORY: Family History  Problem Relation Age of Onset  . Hypertension Father   . Stroke Father   . Diabetes Father   .  Heart disease Mother   . Hypertension Mother   . Hyperlipidemia Mother   . Hypertension Sister   . Hypertension Brother   . Hypertension Sister   . Hypertension Brother   . Cancer Other 47    colon cancer   . Cancer Maternal Aunt     cancer, unknown type   . Cancer Maternal Uncle     bone cancer   . Cancer Paternal Aunt     lung cancer  . Cancer Paternal Uncle     lung cancer  . Cancer Maternal Uncle     colon cancer and prostate cancer   . Cancer Maternal Uncle     prostate cancer     ALLERGIES:  is allergic to penicillins.  MEDICATIONS:  Current Outpatient Prescriptions  Medication Sig Dispense Refill  . albuterol (PROVENTIL HFA;VENTOLIN HFA) 108 (90 BASE) MCG/ACT inhaler Inhale 2 puffs into the lungs every 4 (four) hours as needed for wheezing.    . fluticasone (FLONASE) 50 MCG/ACT nasal spray Place 2 sprays into both nostrils daily. 16 g 2  . gabapentin (NEURONTIN) 300 MG capsule Take 300 mg by mouth 3 (three) times daily.     Marland Kitchen losartan-hydrochlorothiazide (HYZAAR) 50-12.5 MG tablet Take 1 tablet by mouth daily.    . montelukast (SINGULAIR) 10  MG tablet Take 1 tablet (10 mg total) by mouth at bedtime. 30 tablet 0  . Multiple Vitamin (MULTIVITAMIN WITH MINERALS) TABS tablet Take 1 tablet by mouth daily.    . pantoprazole (PROTONIX) 40 MG tablet Take 40 mg by mouth daily.    . simvastatin (ZOCOR) 10 MG tablet Take 1ms at bedtime  5  . sodium chloride (OCEAN) 0.65 % SOLN nasal spray Place 1 spray into both nostrils as needed for congestion. 88 mL 0  . tiZANidine (ZANAFLEX) 4 MG tablet Take 4 mg by mouth 3 (three) times daily as needed for spasms.  5  . TOUJEO SOLOSTAR 300 UNIT/ML SOPN Inject 50 Units into the skin at bedtime.   1  . traMADol (ULTRAM) 50 MG tablet Take 50 mg by mouth 2 (two) times daily as needed for pain.     No current facility-administered medications for this visit.     REVIEW OF SYSTEMS:   Constitutional: Denies fevers, chills or abnormal night sweats Eyes: Denies blurriness of vision, double vision or watery eyes Ears, nose, mouth, throat, and face: Denies mucositis or sore throat Respiratory: no dyspnea or wheezes (+) cough  Cardiovascular: Denies palpitation, chest discomfort or lower extremity swelling Gastrointestinal:  Denies nausea, heartburn or change in bowel habits Skin: Denies abnormal skin rashes Lymphatics: Denies new lymphadenopathy or easy bruising Neurological:Denies numbness, tingling or new weaknesses Behavioral/Psych: Mood is stable, no new changes  All other systems were reviewed with the patient and are negative.  PHYSICAL EXAMINATION:  ECOG PERFORMANCE STATUS: 1 - Symptomatic but completely ambulatory  Vitals:   08/25/16 1433  BP: (!) 151/78  Pulse: 66  Resp: 18  Temp: 98.5 F (36.9 C)   Filed Weights   08/25/16 1433  Weight: 175 lb 4.8 oz (79.5 kg)    GENERAL:alert, no distress and comfortable SKIN: skin color, texture, turgor are normal, no rashes or significant lesions EYES: normal, conjunctiva are pink and non-injected, sclera clear OROPHARYNX:no exudate, no erythema  and lips, buccal mucosa, and tongue normal  NECK: supple, thyroid normal size, non-tender, without nodularity LYMPH:  no palpable lymphadenopathy in the cervical, axillary or inguinal LUNGS: clear to auscultation and percussion with normal  breathing effort HEART: regular rate & rhythm and no murmurs and no lower extremity edema ABDOMEN:abdomen soft, mild tenderness at the low pelvic area, bo rebound pain, and normal bowel sounds Musculoskeletal:no cyanosis of digits and no clubbing. No focal tenderness  PSYCH: alert & oriented x 3 with fluent speech NEURO: no focal motor/sensory deficits  LABORATORY DATA:  I have reviewed the data as listed CBC Latest Ref Rng & Units 08/17/2016 08/05/2016 06/02/2016  WBC 3.9 - 10.3 10e3/uL 3.9 4.4 5.5  Hemoglobin 11.6 - 15.9 g/dL 11.7 11.6 12.0  Hematocrit 34.8 - 46.6 % 33.9(L) 32.4(L) 32.6(L)  Platelets 145 - 400 10e3/uL 183 177 212    Recent Labs  06/02/16 2017 08/05/16 1117 08/17/16 1145  NA 138 141 137  K 4.1 4.5 4.4  CL 106  --   --   CO2 _0 GLUCOSE 136* 188* 202*  BUN 15 19.1 19.0  CREATININE 1.30* 1.2* 1.3*  CALCIUM 9.2 9.1 9.0  GFRNONAA 43*  --   --   GFRAA 50*  --   --   PROT 8.5* 7.4 7.6  ALBUMIN 3.7 3.3* 3.4*  AST _1 ALT 13* 12 8  ALKPHOS 94 105 132  BILITOT 1.0 1.29* 1.23*    Results for SUSANA, DUELL (MRN [id number]) as of 08/23/2016 14:17  Ref. Range 07/29/2015 12:06 11/02/2015 10:17 01/26/2016 09:42 08/05/2016 11:17 08/17/2016 11:45  CEA Latest Units: ng/mL 5.6 5.8 (H)     CEA Latest Ref Range: 0.0 - 4.7 ng/mL 9.1 (H) 8.3 (H) 6.8 (H) 11.1 (H)   CEA ([facility]) Latest Ref Range: 0.00 - 5.00 ng/mL   6.34 (H) 12.27 (H) 14.56 (H)   PATHOLOGY REPORT: Diagnosis 02/04/2014 Surgical [P], ascending, transverse, descending, polyp - FRAGMENTS OF TUBULAR ADENOMAS (X3); NEGATIVE FOR HIGH GRADE DYSPLASIA OR MALIGNANCY.  Diagnosis 01/29/2013 1. Colon, segmental resection, rectosigmoid - INVASIVE WELL DIFFERENTIATED  ADENOCARCINOMA ARISING IN BACKGROUND OF TUBULOVILLOUS ADENOMA, INVADING INTO THE SUBMUCOSA. - NO EVIDENCE OF ANGIOLYMPHATIC INVASION IDENTIFIED. - EIGHT LYMPH NODES, NEGATIVE FOR METASTATIC CARCINOMA (0/8 ), PLEASE SEE COMMENT - MULTIPLE SERRATED POLYPS/ADENOMA. - RESECTION MARGINS, NEGATIVE FOR ATYPIA OR MALIGNANCY. 2. Colon, resection margin (donut), anastomotic rings - BENIGN COLONIC MUCOSA, NO EVIDENCE OF MALIGNANCY.  RADIOGRAPHIC STUDIES: I have personally reviewed the radiological images as listed and agreed with the findings in the report.  PET 05/14/2015  IMPRESSION: No evidence of recurrent or metastatic carcinoma within the chest, abdomen, or pelvis.  Resolved and/or decreased size of ill-defined bilateral pulmonary nodular opacities, consistent with resolving infectious or inflammatory etiology. Chronic interstitial lung disease again Noted.  CT CHEST HIGH RESOLUTION 07/06/16 IMPRESSION: 1. Pulmonary parenchymal pattern of fibrosis is progressive from 03/11/2014, indicative of usual interstitial pneumonitis. 2. Coronary artery calcification. 3. Cholelithiasis.  CT A/P WO CONTRAST 08/17/2016 IMPRESSION: 1. No evidence of local tumor recurrence at the colorectal anastomosis. 2. No evidence of metastatic disease in the abdomen or pelvis. 3. No appreciable interval change in interstitial lung disease at the lung bases. 4. Additional findings include aortic atherosclerosis, cholelithiasis and mild colonic diverticulosis.   ASSESSMENT & PLAN:  61 y.o. female, with past medical history of stage I rectal adenocarcinoma in August 2014, status post complete surgical resection. Now presents with a rising CEA.  1. History of rectal adenocarcinoma, stage I, arising CEA, indeterminate lung nodules -she is more than 3 years out of her colon cancer surgery. -We discussed that risk of cancer recurrence after 3 years will further decrease -  Her CEA has been mildly elevated,  overall stable, it was 14.56 last week, slightly higher than before. -her repeated CT chest from 01/12/2015 showed stable bilateral small lung nodules, some are smaller than last time, most likely benign changes. -I discussed her PET scan from 05/14/2015, which showed no evidence of recurrent disease. Pulmonary nodules has previously decreased in size, consistent with benign process. -She is clinically doing well, exam was unremarkable, no clinical concern for recurrence. -I discussed her surveillance CT scan from 08/17/2016, which showed no evidence of recurrence. -It has been 4 years out of her initial diagnosis, the risk of recurrence is much lower now. -continue surveillance, she will follow up with lab including CEA every 6 months. If she is clinically doing well, I do not think this needs more surveillance CT scans. -Patient has mild stool incontinence, I encouraged her to do pelvic physical therapy.  2. HTN, DM, depression -Follow up with primary care physician -Her medication has been adjusted previously.  3. CKD -her Cr was 1.9 previously , overall stable -will avoid nephrotoxins such as NSAIDs and IV contrast  4. Worsening back pain -She has had chronic low back pain for several years, worse in the past year -I previously encouraged her to follow-up with her primary care physician, to see if she needs to see orthopedic surgeon  5. Smoking - CEA, 14.36 on 08/17/2016, could be related to smoking - Patient reports she quit smoking a month ago. Smokes only 1-2 times a week.  - I advised the patient to quit smoking completely   Plan - I will refer her to a pelvic PT - I have printed out her lab results and we discussed them - f/u & lab in 6 months  All questions were answered. The patient knows to call the clinic with any problems, questions or concerns.  I spent 20 minutes counseling the patient face to face. The total time spent in the appointment was 25 minutes and more than 50%  was on counseling.  This document serves as a record of services personally performed by Truitt Merle, MD. It was created on her behalf by Brandt Loosen, a trained medical scribe. The creation of this record is based on the scribe's personal observations and the provider's statements to them. This document has been checked and approved by the attending provider.     Truitt Merle, MD 08/25/2016

## 2016-08-25 ENCOUNTER — Ambulatory Visit (HOSPITAL_BASED_OUTPATIENT_CLINIC_OR_DEPARTMENT_OTHER): Payer: Medicare HMO | Admitting: Hematology

## 2016-08-25 VITALS — BP 151/78 | HR 66 | Temp 98.5°F | Resp 18 | Ht 63.0 in | Wt 175.3 lb

## 2016-08-25 DIAGNOSIS — E119 Type 2 diabetes mellitus without complications: Secondary | ICD-10-CM | POA: Diagnosis not present

## 2016-08-25 DIAGNOSIS — Z720 Tobacco use: Secondary | ICD-10-CM

## 2016-08-25 DIAGNOSIS — F329 Major depressive disorder, single episode, unspecified: Secondary | ICD-10-CM

## 2016-08-25 DIAGNOSIS — R911 Solitary pulmonary nodule: Secondary | ICD-10-CM

## 2016-08-25 DIAGNOSIS — N189 Chronic kidney disease, unspecified: Secondary | ICD-10-CM

## 2016-08-25 DIAGNOSIS — I10 Essential (primary) hypertension: Secondary | ICD-10-CM

## 2016-08-25 DIAGNOSIS — C20 Malignant neoplasm of rectum: Secondary | ICD-10-CM | POA: Diagnosis not present

## 2016-08-25 DIAGNOSIS — R970 Elevated carcinoembryonic antigen [CEA]: Secondary | ICD-10-CM

## 2016-08-26 ENCOUNTER — Encounter: Payer: Self-pay | Admitting: Hematology

## 2016-08-31 ENCOUNTER — Other Ambulatory Visit: Payer: Self-pay

## 2016-08-31 DIAGNOSIS — J849 Interstitial pulmonary disease, unspecified: Secondary | ICD-10-CM

## 2016-08-31 NOTE — Progress Notes (Signed)
Pt returned Jasmine's call earlier this afternoon - discussed results/recs as stated by AD.  Pt was okay with the additional labs and will come at her convenience for lab draw.  Spoke with [facility] for assistance on ordering the correct labs.    When [place] went to sign the orders within Epic, she was unable to do so - sounds like the lab has not been 'built' within the system yet.  Have LMOM TCB x1 to ask pt wait a few days before coming in for the lab draw.

## 2016-09-07 ENCOUNTER — Telehealth: Payer: Self-pay | Admitting: Hematology

## 2016-09-07 ENCOUNTER — Other Ambulatory Visit: Payer: Medicare HMO

## 2016-09-07 DIAGNOSIS — J849 Interstitial pulmonary disease, unspecified: Secondary | ICD-10-CM

## 2016-09-07 NOTE — Telephone Encounter (Signed)
Called patient to inform her of next scheduled appointments.

## 2016-09-08 LAB — ANTIMYELOPEROXIDASE (MPO) ABS: Myeloperoxidase Ab: <9 U/mL (ref 0.0–9.0)

## 2016-09-08 LAB — ANTI-JO 1 ANTIBODY, IGG: Anti JO-1: <0.2 AI (ref 0.0–0.9)

## 2016-09-08 LAB — ANTIPROTEINASE 3 (PR-3) ABS: ANCA Proteinase 3: <3.5 U/mL (ref 0.0–3.5)

## 2017-02-15 ENCOUNTER — Ambulatory Visit: Payer: Medicare HMO | Admitting: Adult Health

## 2017-02-28 NOTE — Progress Notes (Signed)
[facility]  [phone]) [phone] Fax:([phone]) [phone]  Clinic follow up Note   Patient Care Team: Nolene Ebbs, MD as PCP - General (Internal Medicine) Truitt Merle, MD as Consulting Physician (Hematology) Leighton Ruff, MD as Consulting Physician (General Surgery) Arta Silence, MD as Consulting Physician (Gastroenterology) 03/02/2017    CHIEF COMPLAINTS:  History of rectal cancer, elevated CEA    Oncology History   Rectal cancer   Staging form: Colon and Rectum, AJCC 7th Edition     Pathologic: Stage I (T1, N0, cM0) - Signed by Truitt Merle, MD on 09/26/2014       Rectal cancer ([place])   09/13/2012 Procedure    Colonoscopy & endorectal ultrasound-Dr. Outlaw=Distal colonic polyp      11/14/2012 Procedure    Sigmoidoscopy & Laparoscopy=Dr. Donne Hazel      01/29/2013 Pathologic Stage    pT1N0cM0, stage I, well-differentiated adenocarcinoma arising in background of tubular villous adenoma, invading into the submucosa. No evidence of angiolymphatic invasion. 18 lymph nodes were negative.      01/29/2013 Surgery    Rectosigmoid segmental resection by Dr. Marcello Moores. Surgical margin was negative.      02/04/2013 Initial Diagnosis    Rectal cancer      03/11/2014 Imaging    CT C/A/P=No acute abdominal/pelvic findings, mass lesions or adenopathy. No findings to suggest recurrent rectal cancer and no adenopathy or metastatic disease. 2. Emphysematous changes. 3. Suspect early interstitial lung disease. 4. Tortuous and mil      09/12/2014 Tumor Marker    CEA=6.4      10/14/2014 Imaging    PET showed new and enlarging vague pulmonary nodules bilaterally worrisome for metastatic disease, no other evidence of cancer recurrence       12/09/2014 Tumor Marker    7.5      01/12/2015 Imaging    CT chest showed no significant change in small bilateral subcentimeter solid pulmonary nodules, which are likely benign. Chronic interstitial lung disease.      01/12/2015 Tumor Marker     CEA 8.1      07/13/2016 - 07/13/2016 Hospital Admission    Admit date: 07/13/2016  Patient presented to ED with complain of fecal incontinence      08/17/2016 Imaging     CT A/P WO CONTRAST IMPRESSION: 1. No evidence of local tumor recurrence at the colorectal anastomosis. 2. No evidence of metastatic disease in the abdomen or pelvis. 3. No appreciable interval change in interstitial lung disease at the lung bases. 4. Additional findings include aortic atherosclerosis, cholelithiasis and mild colonic diverticulosis.       HISTORY OF PRESENTING ILLNESS:  Erica [name] 61 y.o. female is here because of her history of stage I rectal cancer and recent abnormal CEA. She presents with her sister to the clinic today.   Her rectal cancer was diagnosed in August 2014 by screening colonoscopy. She was found to have multiple colon polyps in April 2014, which were removed by colonoscopy and pathology showedtubular or tubulovillous adenoma, and a rectal polyp biopsy showed high-grade dysplasia. She underwent LAR on 01/29/2013, surgical pathology showed invasive well-differentiated adenocarcinoma, pT1N0, 8 lymph nodes were negative. She has been followed by Dr. Marcello Moores since then. Her last CTs scan was in September 2015 which was negative for disease recurrence, and colonoscopy on 02/04/2014 showed no evidence of disease recurrence, except 3 colon polyps which were removed. Her CEA however on 09/12/2014 was elevated at 6.4. She was referred by Dr. Marcello Moores for further evaluation.  She  has some residual low abdominal pain since the surgery, she has loose BM 2-4 times daily, no blood, appetite and energy level is good, no significant change lately, except low back pain for the past 6 month, which is 6/10, no radiation pain. No recently weight loss.   CURRENT THERAPY: Surveillance  INTERIM HISTORY: Wenda returns for follow-up.She has been doing well since our last visit. She takes her insulin at night now  and has not eaten today. She checks her glucose about 3x weekly at home. She says she is drinking water adequately. Her smoking has decreased and she has a good appetite.   MEDICAL HISTORY:  Past Medical History:  Diagnosis Date  . Arthritis    especially in shoulders  . Asthma   . Depression   . Diabetes mellitus   . GERD (gastroesophageal reflux disease)   . Headache(784.0)    "mild"  . Hypertension   . Mental disorder    depression  . Neuropathy    feet   . Pain    arthritis pain - takes tramadol as needed  . Rectal polyp    very little bleeding with bowel movements- no pain    SURGICAL HISTORY: Past Surgical History:  Procedure Laterality Date  . ANAL RECTAL MANOMETRY N/A 07/13/2016   Procedure: ANO RECTAL MANOMETRY;  Surgeon: Leighton Ruff, MD;  Location: [facility];  Service: Endoscopy;  Laterality: N/A;  . CESAREAN SECTION    . EUS N/A 11/21/2012   Procedure: LOWER ENDOSCOPIC ULTRASOUND (EUS);  Surgeon: Arta Silence, MD;  Location: [facility];  Service: Endoscopy;  Laterality: N/A;  . FLEXIBLE SIGMOIDOSCOPY N/A 11/21/2012   Procedure: FLEXIBLE SIGMOIDOSCOPY;  Surgeon: Arta Silence, MD;  Location: [facility];  Service: Endoscopy;  Laterality: N/A;  . FLEXIBLE SIGMOIDOSCOPY N/A 01/28/2013   Procedure: FLEXIBLE SIGMOIDOSCOPY;  Surgeon: Leighton Ruff, MD;  Location: [facility];  Service: Endoscopy;  Laterality: N/A;  . LAPAROSCOPIC LOW ANTERIOR RESECTION N/A 01/29/2013   Procedure: LAPAROSCOPIC LOW ANTERIOR RESECTION, Rigid Proctoscopy;  Surgeon: Leighton Ruff, MD;  Location: [facility];  Service: General;  Laterality: N/A;  . LAPAROSCOPIC SIGMOID COLECTOMY N/A 11/14/2012   Procedure: DIAGNOSTIC LAPAROSCOPY AND SIGMOIDMOIDOSCOPY ;  Surgeon: Rolm Bookbinder, MD;  Location: [facility];  Service: General;  Laterality: N/A;  . RECTAL ULTRASOUND N/A 07/13/2016   Procedure: RECTAL ULTRASOUND;  Surgeon: Leighton Ruff, MD;  Location: [facility];  Service: Endoscopy;  Laterality:  N/A;  . TONSILLECTOMY    . TONSILLECTOMY AND ADENOIDECTOMY      SOCIAL HISTORY: History   Social History  . Marital Status: Single    Spouse Name: N/A  . Number of Children: One son, 30   . Years of Education: N/A   Occupational History  . Disabled due to depression, DM    Social History Main Topics  . Smoking status: Current Every Day Smoker -- 0.25 packs/day for 20 years    Types: Cigarettes  . Smokeless tobacco: Never Used     Comment: one every 2-3 days  . Alcohol Use: 0.0 oz/week     Comment: 3 cans beer a week      pt states crack cocaine was 5 or 6 yrs ago   . Drug Use: Yes    Special: "Crack" cocaine     Comment: last time was a year ago   . Sexual Activity: No   Other Topics Concern  . Not on file   Social History Narrative    FAMILY HISTORY: Family History  Problem Relation  Age of Onset  . Hypertension Father   . Stroke Father   . Diabetes Father   . Heart disease Mother   . Hypertension Mother   . Hyperlipidemia Mother   . Hypertension Sister   . Hypertension Brother   . Hypertension Sister   . Hypertension Brother   . Cancer Other 47       colon cancer   . Cancer Maternal Aunt        cancer, unknown type   . Cancer Maternal Uncle        bone cancer   . Cancer Paternal Aunt        lung cancer  . Cancer Paternal Uncle        lung cancer  . Cancer Maternal Uncle        colon cancer and prostate cancer   . Cancer Maternal Uncle        prostate cancer     ALLERGIES:  is allergic to penicillins.  MEDICATIONS:  Current Outpatient Prescriptions  Medication Sig Dispense Refill  . albuterol (PROVENTIL HFA;VENTOLIN HFA) 108 (90 BASE) MCG/ACT inhaler Inhale 2 puffs into the lungs every 4 (four) hours as needed for wheezing.    . fluticasone (FLONASE) 50 MCG/ACT nasal spray Place 2 sprays into both nostrils daily. 16 g 2  . gabapentin (NEURONTIN) 300 MG capsule Take 300 mg by mouth 3 (three) times daily.     Marland Kitchen losartan-hydrochlorothiazide  (HYZAAR) 50-12.5 MG tablet Take 1 tablet by mouth daily.    . montelukast (SINGULAIR) 10 MG tablet Take 1 tablet (10 mg total) by mouth at bedtime. 30 tablet 0  . Multiple Vitamin (MULTIVITAMIN WITH MINERALS) TABS tablet Take 1 tablet by mouth daily.    . pantoprazole (PROTONIX) 40 MG tablet Take 40 mg by mouth daily.    . simvastatin (ZOCOR) 10 MG tablet Take 43ms at bedtime  5  . sodium chloride (OCEAN) 0.65 % SOLN nasal spray Place 1 spray into both nostrils as needed for congestion. 88 mL 0  . tiZANidine (ZANAFLEX) 4 MG tablet Take 4 mg by mouth 3 (three) times daily as needed for spasms.  5  . TOUJEO SOLOSTAR 300 UNIT/ML SOPN Inject 50 Units into the skin at bedtime.   1  . traMADol (ULTRAM) 50 MG tablet Take 50 mg by mouth 2 (two) times daily as needed for pain.     No current facility-administered medications for this visit.     REVIEW OF SYSTEMS:   Constitutional: Denies fevers, chills or abnormal night sweats Eyes: Denies blurriness of vision, double vision or watery eyes Ears, nose, mouth, throat, and face: Denies mucositis or sore throat Respiratory: no dyspnea or wheezes  Cardiovascular: Denies palpitation, chest discomfort or lower extremity swelling Gastrointestinal:  Denies nausea, heartburn or change in bowel habits Skin: Denies abnormal skin rashes Lymphatics: Denies new lymphadenopathy or easy bruising Neurological:Denies numbness, tingling or new weaknesses Behavioral/Psych: Mood is stable, no new changes  All other systems were reviewed with the patient and are negative.  PHYSICAL EXAMINATION:  ECOG PERFORMANCE STATUS: 1 - Symptomatic but completely ambulatory  Vitals:   03/02/17 1508  BP: (!) 188/103  Pulse: 62  Resp: (!) 24  Temp: 98.2 F (36.8 C)  SpO2: 100%   Filed Weights   03/02/17 1508  Weight: 171 lb 11.2 oz (77.9 kg)    GENERAL:alert, no distress and comfortable SKIN: skin color, texture, turgor are normal, no rashes or significant  lesions EYES: normal, conjunctiva are pink  and non-injected, sclera clear OROPHARYNX:no exudate, no erythema and lips, buccal mucosa, and tongue normal  NECK: supple, thyroid normal size, non-tender, without nodularity LYMPH:  no palpable lymphadenopathy in the cervical, axillary or inguinal LUNGS: clear to auscultation and percussion with normal breathing effort HEART: regular rate & rhythm and no murmurs and no lower extremity edema ABDOMEN:abdomen soft, mild tenderness at the low pelvic area, bo rebound pain, and normal bowel sounds Musculoskeletal:no cyanosis of digits and no clubbing. No focal tenderness  PSYCH: alert & oriented x 3 with fluent speech NEURO: no focal motor/sensory deficits  LABORATORY DATA:  I have reviewed the data as listed CBC Latest Ref Rng & Units 03/02/2017 08/17/2016 08/05/2016  WBC 3.9 - 10.3 10e3/uL 4.4 3.9 4.4  Hemoglobin 11.6 - 15.9 g/dL 12.5 11.7 11.6  Hematocrit 34.8 - 46.6 % 35.4 33.9(L) 32.4(L)  Platelets 145 - 400 10e3/uL 192 183 177    Recent Labs  06/02/16 2017 08/05/16 1117 08/17/16 1145 03/02/17 1428  NA 138 141 137 131*  K 4.1 4.5 4.4 4.8  CL 106  --   --   --   CO2 _0 GLUCOSE 136* 188* 202* 482*  BUN 15 19.1 19.0 20.0  CREATININE 1.30* 1.2* 1.3* 1.7*  CALCIUM 9.2 9.1 9.0 9.2  GFRNONAA 43*  --   --   --   GFRAA 50*  --   --   --   PROT 8.5* 7.4 7.6 8.0  ALBUMIN 3.7 3.3* 3.4* 3.2*  AST _1 ALT 13* _2 ALKPHOS 94 105 132 139  BILITOT 1.0 1.29* 1.23* 0.99    Results for DYNESHIA, BACCAM (MRN [id number]) as of 08/23/2016 14:17  Ref. Range 07/29/2015 12:06 11/02/2015 10:17 01/26/2016 09:42 08/05/2016 11:17 08/17/2016 11:45  CEA Latest Units: ng/mL 5.6 5.8 (H)     CEA Latest Ref Range: 0.0 - 4.7 ng/mL 9.1 (H) 8.3 (H) 6.8 (H) 11.1 (H)   CEA ([facility]) Latest Ref Range: 0.00 - 5.00 ng/mL   6.34 (H) 12.27 (H) 14.56 (H)   PATHOLOGY REPORT: Diagnosis 02/04/2014 Surgical [P], ascending, transverse, descending, polyp -  FRAGMENTS OF TUBULAR ADENOMAS (X3); NEGATIVE FOR HIGH GRADE DYSPLASIA OR MALIGNANCY.  Diagnosis 01/29/2013 1. Colon, segmental resection, rectosigmoid - INVASIVE WELL DIFFERENTIATED ADENOCARCINOMA ARISING IN BACKGROUND OF TUBULOVILLOUS ADENOMA, INVADING INTO THE SUBMUCOSA. - NO EVIDENCE OF ANGIOLYMPHATIC INVASION IDENTIFIED. - EIGHT LYMPH NODES, NEGATIVE FOR METASTATIC CARCINOMA (0/8 ), PLEASE SEE COMMENT - MULTIPLE SERRATED POLYPS/ADENOMA. - RESECTION MARGINS, NEGATIVE FOR ATYPIA OR MALIGNANCY. 2. Colon, resection margin (donut), anastomotic rings - BENIGN COLONIC MUCOSA, NO EVIDENCE OF MALIGNANCY.  RADIOGRAPHIC STUDIES: I have personally reviewed the radiological images as listed and agreed with the findings in the report.  PET 05/14/2015  IMPRESSION: No evidence of recurrent or metastatic carcinoma within the chest, abdomen, or pelvis.  Resolved and/or decreased size of ill-defined bilateral pulmonary nodular opacities, consistent with resolving infectious or inflammatory etiology. Chronic interstitial lung disease again Noted.  CT CHEST HIGH RESOLUTION 07/06/16 IMPRESSION: 1. Pulmonary parenchymal pattern of fibrosis is progressive from 03/11/2014, indicative of usual interstitial pneumonitis. 2. Coronary artery calcification. 3. Cholelithiasis.  CT A/P WO CONTRAST 08/17/2016 IMPRESSION: 1. No evidence of local tumor recurrence at the colorectal anastomosis. 2. No evidence of metastatic disease in the abdomen or pelvis. 3. No appreciable interval change in interstitial lung disease at the lung bases. 4. Additional findings include aortic atherosclerosis, cholelithiasis and mild colonic diverticulosis.   ASSESSMENT &  PLAN:  61 y.o. female, with past medical history of stage I rectal adenocarcinoma in August 2014, status post complete surgical resection. Now presents with a rising CEA.  1. History of rectal adenocarcinoma, stage I, arising CEA, indeterminate lung  nodules -she is 4.5 years out of her colon cancer surgery. -We discussed that risk of cancer recurrence after 3 years will further decrease -Her CEA has been mildly elevated, overall stable, it was 14.56 6 months ago, today's results still pending. -her repeated CT chest from 01/12/2015 showed stable bilateral small lung nodules, some are smaller than last time, most likely benign changes. -I previously discussed her PET scan from 05/14/2015, which showed no evidence of recurrent disease. Pulmonary nodules has previously decreased in size, consistent with benign process. -I discussed her surveillance CT scan from 08/17/2016, which showed no evidence of recurrence. --She is clinically doing well, exam was unremarkable, no clinical concern for recurrence. CEA pending. - I strongly encouraged her to quit smoking entirely. - f/u in 6 months and then I will discharge her after 5 years of cancer surveillance  2.  DM and hyperglycemia -Follow up with primary care physician -Her medication has been adjusted previously. - Glucose 482 on 03/02/17, she is asymptomatic, anion gap and CO2 normal. No signs of DKA. she received insulin last night, and has not eaten anything today - I strongly encouraged her to drink more water, I offered her IVF here, she declined  -I will call her PCP about insulin dose adjustment   3. CKD -her Cr was 1.7 today, worse than before, likely related to her uncontrolled hyperglycemia -I strongly encouraged her to drink more water -will avoid nephrotoxins such as NSAIDs and IV contrast  4. HTN -Continue medication, and a follow-up with primary care physician  5. Smoking cessation  - CEA, 14.36 on 08/17/2016, could be related to smoking - Patient reports she quit smoking a month ago. Smokes only 1-2 times a week.  - I advised the patient to quit smoking completely   Plan - We have discussed her labs in detail, especially her hyperglycemia - I have printed out her lab results  and we discussed them - f/u in 6 months. She can be discharged after next visit  -I will fax note to PCP, and ask my nurse to call so Dr. Jeanie Cooks will advise her insulin dose adjustment  - she will receive flu shot with PCP   All questions were answered. The patient knows to call the clinic with any problems, questions or concerns.  I spent 20 minutes counseling the patient face to face. The total time spent in the appointment was 25 minutes and more than 50% was on counseling.  This document serves as a record of services personally performed by Truitt Merle, MD. It was created on her behalf by Brandt Loosen, a trained medical scribe. The creation of this record is based on the scribe's personal observations and the provider's statements to them. This document has been checked and approved by the attending provider.     Truitt Merle, MD 03/02/2017

## 2017-03-02 ENCOUNTER — Other Ambulatory Visit (HOSPITAL_BASED_OUTPATIENT_CLINIC_OR_DEPARTMENT_OTHER): Payer: Medicare HMO

## 2017-03-02 ENCOUNTER — Ambulatory Visit (HOSPITAL_BASED_OUTPATIENT_CLINIC_OR_DEPARTMENT_OTHER): Payer: Medicare HMO | Admitting: Hematology

## 2017-03-02 ENCOUNTER — Encounter: Payer: Self-pay | Admitting: Hematology

## 2017-03-02 ENCOUNTER — Telehealth: Payer: Self-pay | Admitting: Hematology

## 2017-03-02 ENCOUNTER — Other Ambulatory Visit: Payer: Self-pay | Admitting: *Deleted

## 2017-03-02 VITALS — BP 188/103 | HR 62 | Temp 98.2°F | Resp 24 | Ht 63.0 in | Wt 171.7 lb

## 2017-03-02 DIAGNOSIS — R911 Solitary pulmonary nodule: Secondary | ICD-10-CM | POA: Diagnosis not present

## 2017-03-02 DIAGNOSIS — C20 Malignant neoplasm of rectum: Secondary | ICD-10-CM

## 2017-03-02 DIAGNOSIS — E119 Type 2 diabetes mellitus without complications: Secondary | ICD-10-CM | POA: Diagnosis not present

## 2017-03-02 DIAGNOSIS — Z720 Tobacco use: Secondary | ICD-10-CM | POA: Diagnosis not present

## 2017-03-02 DIAGNOSIS — N189 Chronic kidney disease, unspecified: Secondary | ICD-10-CM

## 2017-03-02 DIAGNOSIS — R739 Hyperglycemia, unspecified: Secondary | ICD-10-CM

## 2017-03-02 DIAGNOSIS — I10 Essential (primary) hypertension: Secondary | ICD-10-CM | POA: Diagnosis not present

## 2017-03-02 DIAGNOSIS — Z794 Long term (current) use of insulin: Secondary | ICD-10-CM | POA: Diagnosis not present

## 2017-03-02 LAB — COMPREHENSIVE METABOLIC PANEL
ALT: 8 U/L (ref 0–55)
AST: 12 U/L (ref 5–34)
Albumin: 3.2 g/dL — ABNORMAL LOW (ref 3.5–5.0)
Alkaline Phosphatase: 139 U/L (ref 40–150)
Anion Gap: 7 mEq/L (ref 3–11)
BUN: 20 mg/dL (ref 7.0–26.0)
CO2: 24 mEq/L (ref 22–29)
Calcium: 9.2 mg/dL (ref 8.4–10.4)
Chloride: 100 mEq/L (ref 98–109)
Creatinine: 1.7 mg/dL — ABNORMAL HIGH (ref 0.6–1.1)
EGFR: 37 mL/min/{1.73_m2} — ABNORMAL LOW (ref >90)
Glucose: 482 mg/dl — ABNORMAL HIGH (ref 70–140)
Potassium: 4.8 mEq/L (ref 3.5–5.1)
Sodium: 131 mEq/L — ABNORMAL LOW (ref 136–145)
Total Bilirubin: 0.99 mg/dL (ref 0.20–1.20)
Total Protein: 8 g/dL (ref 6.4–8.3)

## 2017-03-02 LAB — CBC WITH DIFFERENTIAL/PLATELET
BASO%: 1.3 % (ref 0.0–2.0)
Basophils Absolute: 0.1 10*3/uL (ref 0.0–0.1)
EOS%: 1.7 % (ref 0.0–7.0)
Eosinophils Absolute: 0.1 10*3/uL (ref 0.0–0.5)
HCT: 35.4 % (ref 34.8–46.6)
HGB: 12.5 g/dL (ref 11.6–15.9)
LYMPH%: 25.8 % (ref 14.0–49.7)
MCH: 33.2 pg (ref 25.1–34.0)
MCHC: 35.2 g/dL (ref 31.5–36.0)
MCV: 94.3 fL (ref 79.5–101.0)
MONO#: 0.4 10*3/uL (ref 0.1–0.9)
MONO%: 8.3 % (ref 0.0–14.0)
NEUT#: 2.8 10*3/uL (ref 1.5–6.5)
NEUT%: 62.9 % (ref 38.4–76.8)
Platelets: 192 10*3/uL (ref 145–400)
RBC: 3.75 10*6/uL (ref 3.70–5.45)
RDW: 14.6 % — ABNORMAL HIGH (ref 11.2–14.5)
WBC: 4.4 10*3/uL (ref 3.9–10.3)
lymph#: 1.1 10*3/uL (ref 0.9–3.3)

## 2017-03-02 LAB — CEA (IN HOUSE-CHCC): CEA (CHCC-In House): 18.87 ng/mL — ABNORMAL HIGH (ref 0.00–5.00)

## 2017-03-02 NOTE — Telephone Encounter (Signed)
Gave avs and calendar for march 2019

## 2017-03-10 ENCOUNTER — Telehealth: Payer: Self-pay | Admitting: *Deleted

## 2017-03-10 NOTE — Telephone Encounter (Signed)
Labs faxed to Dr Thedora Hinders PCP.  Nurse Vicente Males informed of Pt's hyperglycemia (482) and to discuss as  insulin adjustment per Dr Burr Medico.   Nurse Vicente Males verified receiving fax

## 2017-05-03 ENCOUNTER — Encounter (HOSPITAL_COMMUNITY): Payer: Self-pay

## 2017-08-25 ENCOUNTER — Other Ambulatory Visit: Payer: Self-pay | Admitting: Internal Medicine

## 2017-08-25 DIAGNOSIS — Z1231 Encounter for screening mammogram for malignant neoplasm of breast: Secondary | ICD-10-CM

## 2017-08-28 ENCOUNTER — Ambulatory Visit: Payer: Medicare HMO

## 2017-08-28 NOTE — Progress Notes (Signed)
[place]  [phone] [phone]  Clinic follow up Note   Patient Care Team: Nolene Ebbs, MD as PCP - General (Internal Medicine) Truitt Merle, MD as Consulting Physician (Hematology) Leighton Ruff, MD as Consulting Physician (General Surgery) Arta Silence, MD as Consulting Physician (Gastroenterology) 08/30/2017    CHIEF COMPLAINTS:  History of rectal cancer, elevated CEA    Oncology History   Rectal cancer   Staging form: Colon and Rectum, AJCC 7th Edition     Pathologic: Stage I (T1, N0, cM0) - Signed by Truitt Merle, MD on 09/26/2014       Rectal cancer ([place])   09/13/2012 Procedure    Colonoscopy & endorectal ultrasound-Dr. Outlaw=Distal colonic polyp      11/14/2012 Procedure    Sigmoidoscopy & Laparoscopy=Dr. Donne Hazel      01/29/2013 Pathologic Stage    pT1N0cM0, stage I, well-differentiated adenocarcinoma arising in background of tubular villous adenoma, invading into the submucosa. No evidence of angiolymphatic invasion. 18 lymph nodes were negative.      01/29/2013 Surgery    Rectosigmoid segmental resection by Dr. Marcello Moores. Surgical margin was negative.      02/04/2013 Initial Diagnosis    Rectal cancer      03/11/2014 Imaging    CT C/A/P=No acute abdominal/pelvic findings, mass lesions or adenopathy. No findings to suggest recurrent rectal cancer and no adenopathy or metastatic disease. 2. Emphysematous changes. 3. Suspect early interstitial lung disease. 4. Tortuous and mil      09/12/2014 Tumor Marker    CEA=6.4      10/14/2014 Imaging    PET showed new and enlarging vague pulmonary nodules bilaterally worrisome for metastatic disease, no other evidence of cancer recurrence       12/09/2014 Tumor Marker    7.5      01/12/2015 Imaging    CT chest showed no significant change in small bilateral subcentimeter solid pulmonary nodules, which are likely benign. Chronic interstitial lung disease.      01/12/2015 Tumor Marker     CEA 8.1      07/13/2016 - 07/13/2016 Hospital Admission    Admit date: 07/13/2016  Patient presented to ED with complain of fecal incontinence      08/17/2016 Imaging     CT A/P WO CONTRAST IMPRESSION: 1. No evidence of local tumor recurrence at the colorectal anastomosis. 2. No evidence of metastatic disease in the abdomen or pelvis. 3. No appreciable interval change in interstitial lung disease at the lung bases. 4. Additional findings include aortic atherosclerosis, cholelithiasis and mild colonic diverticulosis.       HISTORY OF PRESENTING ILLNESS:  Erica [name] 62 y.o. female is here because of her history of stage I rectal cancer and recent abnormal CEA. She presents with her sister to the clinic today.   Her rectal cancer was diagnosed in August 2014 by screening colonoscopy. She was found to have multiple colon polyps in April 2014, which were removed by colonoscopy and pathology showedtubular or tubulovillous adenoma, and a rectal polyp biopsy showed high-grade dysplasia. She underwent LAR on 01/29/2013, surgical pathology showed invasive well-differentiated adenocarcinoma, pT1N0, 8 lymph nodes were negative. She has been followed by Dr. Marcello Moores since then. Her last CTs scan was in September 2015 which was negative for disease recurrence, and colonoscopy on 02/04/2014 showed no evidence of disease recurrence, except 3 colon polyps which were removed. Her CEA however on 09/12/2014 was elevated at 6.4. She was referred by Dr. Marcello Moores for further evaluation.  She  has some residual low abdominal pain since the surgery, she has loose BM 2-4 times daily, no blood, appetite and energy level is good, no significant change lately, except low back pain for the past 6 month, which is 6/10, no radiation pain. No recently weight loss.   CURRENT THERAPY: Surveillance  INTERIM HISTORY: FRANSHESCA CHIPMAN returns for follow-up. She presents to the clinic today with a friend. She reports she has  been doing well other than intermittent abdominal pain for the past 2 months. She saw Dr. Paulita Fujita and he prescribed FDgard for functional dyspepsia. She has been taking it for 2 weeks. She states she has been having normal bowel movements. She reports Dr. Marcello Moores discharged her from her service 1 year ago. She states she has been having a productive cough, secondary to tobacco use. She hasn't seen her pulmonologist in 1 year. She has interstitial lung disease.   On review of systems, pt denies abnormal bleeding, melena, or any other complaints at this time. Pertinent positives are listed and detailed within the above HPI.   MEDICAL HISTORY:  Past Medical History:  Diagnosis Date  . Arthritis    especially in shoulders  . Asthma   . Depression   . Diabetes mellitus   . GERD (gastroesophageal reflux disease)   . Headache(784.0)    "mild"  . Hypertension   . Mental disorder    depression  . Neuropathy    feet   . Pain    arthritis pain - takes tramadol as needed  . Rectal polyp    very little bleeding with bowel movements- no pain    SURGICAL HISTORY: Past Surgical History:  Procedure Laterality Date  . ANAL RECTAL MANOMETRY N/A 07/13/2016   Procedure: ANO RECTAL MANOMETRY;  Surgeon: Leighton Ruff, MD;  Location: [facility];  Service: Endoscopy;  Laterality: N/A;  . CESAREAN SECTION    . EUS N/A 11/21/2012   Procedure: LOWER ENDOSCOPIC ULTRASOUND (EUS);  Surgeon: Arta Silence, MD;  Location: [facility];  Service: Endoscopy;  Laterality: N/A;  . FLEXIBLE SIGMOIDOSCOPY N/A 11/21/2012   Procedure: FLEXIBLE SIGMOIDOSCOPY;  Surgeon: Arta Silence, MD;  Location: [facility];  Service: Endoscopy;  Laterality: N/A;  . FLEXIBLE SIGMOIDOSCOPY N/A 01/28/2013   Procedure: FLEXIBLE SIGMOIDOSCOPY;  Surgeon: Leighton Ruff, MD;  Location: [facility];  Service: Endoscopy;  Laterality: N/A;  . LAPAROSCOPIC LOW ANTERIOR RESECTION N/A 01/29/2013   Procedure: LAPAROSCOPIC LOW ANTERIOR RESECTION,  Rigid Proctoscopy;  Surgeon: Leighton Ruff, MD;  Location: [facility];  Service: General;  Laterality: N/A;  . LAPAROSCOPIC SIGMOID COLECTOMY N/A 11/14/2012   Procedure: DIAGNOSTIC LAPAROSCOPY AND SIGMOIDMOIDOSCOPY ;  Surgeon: Rolm Bookbinder, MD;  Location: [facility];  Service: General;  Laterality: N/A;  . RECTAL ULTRASOUND N/A 07/13/2016   Procedure: RECTAL ULTRASOUND;  Surgeon: Leighton Ruff, MD;  Location: [facility];  Service: Endoscopy;  Laterality: N/A;  . TONSILLECTOMY    . TONSILLECTOMY AND ADENOIDECTOMY      SOCIAL HISTORY: History   Social History  . Marital Status: Single    Spouse Name: N/A  . Number of Children: One son, 90   . Years of Education: N/A   Occupational History  . Disabled due to depression, DM    Social History Main Topics  . Smoking status: Current Every Day Smoker -- 0.25 packs/day for 20 years    Types: Cigarettes  . Smokeless tobacco: Never Used     Comment: one every 2-3 days  . Alcohol Use: 0.0 oz/week  Comment: 3 cans beer a week      pt states crack cocaine was 5 or 6 yrs ago   . Drug Use: Yes    Special: "Crack" cocaine     Comment: last time was a year ago   . Sexual Activity: No   Other Topics Concern  . Not on file   Social History Narrative    FAMILY HISTORY: Family History  Problem Relation Age of Onset  . Hypertension Father   . Stroke Father   . Diabetes Father   . Heart disease Mother   . Hypertension Mother   . Hyperlipidemia Mother   . Hypertension Sister   . Hypertension Brother   . Hypertension Sister   . Hypertension Brother   . Cancer Other 47       colon cancer   . Cancer Maternal Aunt        cancer, unknown type   . Cancer Maternal Uncle        bone cancer   . Cancer Paternal Aunt        lung cancer  . Cancer Paternal Uncle        lung cancer  . Cancer Maternal Uncle        colon cancer and prostate cancer   . Cancer Maternal Uncle        prostate cancer     ALLERGIES:  is allergic to  penicillins.  MEDICATIONS:  Current Outpatient Medications  Medication Sig Dispense Refill  . albuterol (PROVENTIL HFA;VENTOLIN HFA) 108 (90 BASE) MCG/ACT inhaler Inhale 2 puffs into the lungs every 4 (four) hours as needed for wheezing.    . fluticasone (FLONASE) 50 MCG/ACT nasal spray Place 2 sprays into both nostrils daily. 16 g 2  . gabapentin (NEURONTIN) 300 MG capsule Take 300 mg by mouth 3 (three) times daily.     Marland Kitchen losartan-hydrochlorothiazide (HYZAAR) 50-12.5 MG tablet Take 1 tablet by mouth daily.    . montelukast (SINGULAIR) 10 MG tablet Take 1 tablet (10 mg total) by mouth at bedtime. 30 tablet 0  . Multiple Vitamin (MULTIVITAMIN WITH MINERALS) TABS tablet Take 1 tablet by mouth daily.    . NON FORMULARY Take 1 capsule by mouth 4 (four) times daily. FDgard for reflux.    . nortriptyline (PAMELOR) 25 MG capsule TK 1 C PO QHS PRF SLP  5  . pantoprazole (PROTONIX) 40 MG tablet Take 40 mg by mouth daily.    . simvastatin (ZOCOR) 10 MG tablet Take 67ms at bedtime  5  . sodium chloride (OCEAN) 0.65 % SOLN nasal spray Place 1 spray into both nostrils as needed for congestion. 88 mL 0  . tiZANidine (ZANAFLEX) 4 MG tablet Take 4 mg by mouth 3 (three) times daily as needed for spasms.  5  . TOUJEO SOLOSTAR 300 UNIT/ML SOPN Inject 100 Units into the skin at bedtime.   1  . traMADol (ULTRAM) 50 MG tablet Take 50 mg by mouth 2 (two) times daily as needed for pain.     No current facility-administered medications for this visit.     REVIEW OF SYSTEMS:   Constitutional: Denies fevers, chills or abnormal night sweats Eyes: Denies blurriness of vision, double vision or watery eyes Ears, nose, mouth, throat, and face: Denies mucositis or sore throat Respiratory: no dyspnea or wheezes  Cardiovascular: Denies palpitation, chest discomfort or lower extremity swelling Gastrointestinal:  Denies nausea, change in bowel habits (+) indegestion Skin: Denies abnormal skin rashes Lymphatics: Denies  new  lymphadenopathy or easy bruising Neurological:Denies numbness, tingling or new weaknesses Behavioral/Psych: Mood is stable, no new changes  All other systems were reviewed with the patient and are negative.  PHYSICAL EXAMINATION:  ECOG PERFORMANCE STATUS: 1 - Symptomatic but completely ambulatory  Vitals:   08/30/17 0945  BP: (!) 175/92  Pulse: 72  Resp: 19  Temp: 97.8 F (36.6 C)  SpO2: 100%   Filed Weights   08/30/17 0945  Weight: 170 lb 14.4 oz (77.5 kg)    GENERAL:alert, no distress and comfortable SKIN: skin color, texture, turgor are normal, no rashes or significant lesions EYES: normal, conjunctiva are pink and non-injected, sclera clear OROPHARYNX:no exudate, no erythema and lips, buccal mucosa, and tongue normal  NECK: supple, thyroid normal size, non-tender, without nodularity LYMPH:  no palpable lymphadenopathy in the cervical, axillary or inguinal LUNGS: (+) bilateral fine rales at the base of the lungs HEART: regular rate & rhythm and no murmurs and no lower extremity edema ABDOMEN:abdomen soft, mild tenderness in mid abdomen, bo rebound pain, and normal bowel sounds Musculoskeletal:no cyanosis of digits and no clubbing. No focal tenderness  PSYCH: alert & oriented x 3 with fluent speech NEURO: no focal motor/sensory deficits  LABORATORY DATA:  I have reviewed the data as listed CBC Latest Ref Rng & Units 08/30/2017 03/02/2017 08/17/2016  WBC 3.9 - 10.3 K/uL 4.5 4.4 3.9  Hemoglobin 11.6 - 15.9 g/dL - 12.5 11.7  Hematocrit 34.8 - 46.6 % 31.0(L) 35.4 33.9(L)  Platelets 145 - 400 K/uL 166 192 183   Recent Labs    03/02/17 1428 08/30/17 0905  NA 131* 136  K 4.8 4.4  CL  --  105  CO2 24 24  GLUCOSE 482* 269*  BUN 20.0 22  CREATININE 1.7* 1.36*  CALCIUM 9.2 8.8  GFRNONAA  --  40*  GFRAA  --  47*  PROT 8.0 8.1  ALBUMIN 3.2* 3.0*  AST 12 14  ALT 8 10  ALKPHOS 139 148  BILITOT 0.99 1.1   CEA (IN HOUSE-[facility])  Order: [id number]  Status:  Final result  Visible to patient:  No (Not Released) Next appt:  09/19/2017 at 12:30 PM in Radiology ([facility]) Dx:  Rectal cancer ([place])   Ref Range & Units 09:05 (08/30/17) 56moago (03/02/17) 119yrgo (08/17/16) 1y39yro (08/05/16) 41yr66yr (01/26/16)  CEA ([facility]-In House) 0.00 - 5.00 ng/mL 19.85 Abnormally high   18.87 Abnormally high  CM 14.56 Abnormally high  CM 12.27 Abnormally high  CM 6.34 Abnormally high           PATHOLOGY REPORT: Diagnosis 02/04/2014 Surgical [P], ascending, transverse, descending, polyp - FRAGMENTS OF TUBULAR ADENOMAS (X3); NEGATIVE FOR HIGH GRADE DYSPLASIA OR MALIGNANCY.  Diagnosis 01/29/2013 1. Colon, segmental resection, rectosigmoid - INVASIVE WELL DIFFERENTIATED ADENOCARCINOMA ARISING IN BACKGROUND OF TUBULOVILLOUS ADENOMA, INVADING INTO THE SUBMUCOSA. - NO EVIDENCE OF ANGIOLYMPHATIC INVASION IDENTIFIED. - EIGHT LYMPH NODES, NEGATIVE FOR METASTATIC CARCINOMA (0/8 ), PLEASE SEE COMMENT - MULTIPLE SERRATED POLYPS/ADENOMA. - RESECTION MARGINS, NEGATIVE FOR ATYPIA OR MALIGNANCY. 2. Colon, resection margin (donut), anastomotic rings - BENIGN COLONIC MUCOSA, NO EVIDENCE OF MALIGNANCY.  RADIOGRAPHIC STUDIES: I have personally reviewed the radiological images as listed and agreed with the findings in the report.  PET 05/14/2015  IMPRESSION: No evidence of recurrent or metastatic carcinoma within the chest, abdomen, or pelvis. Resolved and/or decreased size of ill-defined bilateral pulmonary nodular opacities, consistent with resolving infectious or inflammatory etiology. Chronic interstitial lung disease again Noted.  CT CHEST HIGH  RESOLUTION 07/06/16 IMPRESSION: 1. Pulmonary parenchymal pattern of fibrosis is progressive from 03/11/2014, indicative of usual interstitial pneumonitis. 2. Coronary artery calcification. 3. Cholelithiasis.  CT A/P WO CONTRAST 08/17/2016 IMPRESSION: 1. No evidence of local tumor recurrence at the colorectal anastomosis. 2. No evidence of  metastatic disease in the abdomen or pelvis. 3. No appreciable interval change in interstitial lung disease at the lung bases. 4. Additional findings include aortic atherosclerosis, cholelithiasis and mild colonic diverticulosis.   ASSESSMENT & PLAN:  62 y.o. female, with past medical history of stage I rectal adenocarcinoma in August 2014, status post complete surgical resection. Now presents with a rising CEA.  1. History of rectal adenocarcinoma, stage I, arising CEA, indeterminate lung nodules -she is 4.5 years out of her colon cancer surgery. -We previously discussed that risk of cancer recurrence after 3 years will further decrease -her repeated CT chest from 01/12/2015 showed stable bilateral small lung nodules, some are smaller than last time, most likely benign changes. -I previously discussed her PET scan from 05/14/2015, which showed no evidence of recurrent disease. Pulmonary nodules has previously decreased in size, consistent with benign process. - I strongly encouraged her to quit smoking entirely. -I previously discussed her surveillance CT scan from 08/17/2016, which showed no evidence of recurrence. -Her CEA has been mildly elevated, overall stable, it was 14.56 1 year ago and 18.87 6 months ago, and 19.85 today -She is clinically doing well other than intermittent indigestion and bilateral rales in her lungs, exam was unremarkable, no clinical concern for recurrence.  -labs reviewed, she has developed mild anemia now. She denies melena or any abnormal bleeding. She is due for a colonoscopy and I encouraged her to f/u with Dr. Paulita Fujita and have one done this year. I encouraged her to start taking a multivitamin with iron - f/u in 1 year and I will likely discharge her afterwards   2.  DM and hyperglycemia -Follow up with primary care physician -Her medication has been adjusted previously. - Glucose 482 on 03/02/17, she is asymptomatic, anion gap and CO2 normal. No signs of DKA.  she received insulin the night before, and has not eaten anything today - I previously encouraged her to drink more water, I offered her IVF here, she declined  -I will call her PCP about insulin dose adjustment   3. CKD -her Cr was 1.7 previously, worse than before, likely related to her uncontrolled hyperglycemia -I strongly encouraged her to drink more water -will avoid nephrotoxins such as NSAIDs and IV contrast  4. HTN -Continue medication, and a follow-up with primary care physician  5. Smoking cessation  - CEA, 14.36 on 08/17/2016, could be related to smoking - Patient reports she quit smoking a month ago. Smokes only 1-2 times a week.  - I previously advised the patient to quit smoking completely   6. Interstitial Lung Disease -She was previously followed by pulmonologist, Dr. Corrie Dandy at [facility] [place].  -CT Chest revealed fibrosis -Today she has bilateral rales consistent with fibrosis and has not seen her pulmonologist in over a year. I advised her she needs to follow up with them and quit smoking   Plan -F/u with pulmonology  -F/u with Dr. Paulita Fujita and schedule colonoscopy  -Start multivitamin with iron  -F/u in 1 year with lab and I will likely discharge her at that time    All questions were answered. The patient knows to call the clinic with any problems, questions or concerns.  I spent 20 minutes counseling  the patient face to face. The total time spent in the appointment was 25 minutes and more than 50% was on counseling.  This document serves as a record of services personally performed by Truitt Merle, MD. It was created on her behalf by Theresia Bough, a trained medical scribe. The creation of this record is based on the scribe's personal observations and the provider's statements to them.   I have reviewed the above documentation for accuracy and completeness, and I agree with the above.     Truitt Merle, MD 08/30/2017 2:29 PM

## 2017-08-29 ENCOUNTER — Other Ambulatory Visit: Payer: Self-pay | Admitting: *Deleted

## 2017-08-29 ENCOUNTER — Other Ambulatory Visit: Payer: Self-pay | Admitting: Hematology

## 2017-08-29 DIAGNOSIS — C20 Malignant neoplasm of rectum: Secondary | ICD-10-CM

## 2017-08-30 ENCOUNTER — Inpatient Hospital Stay: Payer: Medicare HMO | Attending: Hematology | Admitting: Hematology

## 2017-08-30 ENCOUNTER — Inpatient Hospital Stay: Payer: Medicare HMO

## 2017-08-30 ENCOUNTER — Encounter: Payer: Self-pay | Admitting: Hematology

## 2017-08-30 ENCOUNTER — Telehealth: Payer: Self-pay | Admitting: Hematology

## 2017-08-30 VITALS — BP 175/92 | HR 72 | Temp 97.8°F | Resp 19 | Ht 63.0 in | Wt 170.9 lb

## 2017-08-30 DIAGNOSIS — E1122 Type 2 diabetes mellitus with diabetic chronic kidney disease: Secondary | ICD-10-CM | POA: Diagnosis not present

## 2017-08-30 DIAGNOSIS — D649 Anemia, unspecified: Secondary | ICD-10-CM | POA: Diagnosis not present

## 2017-08-30 DIAGNOSIS — C20 Malignant neoplasm of rectum: Secondary | ICD-10-CM | POA: Diagnosis present

## 2017-08-30 DIAGNOSIS — J849 Interstitial pulmonary disease, unspecified: Secondary | ICD-10-CM | POA: Diagnosis not present

## 2017-08-30 DIAGNOSIS — R970 Elevated carcinoembryonic antigen [CEA]: Secondary | ICD-10-CM | POA: Diagnosis not present

## 2017-08-30 DIAGNOSIS — Z794 Long term (current) use of insulin: Secondary | ICD-10-CM

## 2017-08-30 DIAGNOSIS — Z720 Tobacco use: Secondary | ICD-10-CM | POA: Diagnosis not present

## 2017-08-30 DIAGNOSIS — N189 Chronic kidney disease, unspecified: Secondary | ICD-10-CM | POA: Diagnosis not present

## 2017-08-30 DIAGNOSIS — R911 Solitary pulmonary nodule: Secondary | ICD-10-CM | POA: Diagnosis not present

## 2017-08-30 DIAGNOSIS — E1165 Type 2 diabetes mellitus with hyperglycemia: Secondary | ICD-10-CM | POA: Insufficient documentation

## 2017-08-30 DIAGNOSIS — J439 Emphysema, unspecified: Secondary | ICD-10-CM

## 2017-08-30 DIAGNOSIS — I129 Hypertensive chronic kidney disease with stage 1 through stage 4 chronic kidney disease, or unspecified chronic kidney disease: Secondary | ICD-10-CM

## 2017-08-30 LAB — CMP (CANCER CENTER ONLY)
ALT: 10 U/L (ref 0–55)
AST: 14 U/L (ref 5–34)
Albumin: 3 g/dL — ABNORMAL LOW (ref 3.5–5.0)
Alkaline Phosphatase: 148 U/L (ref 40–150)
Anion gap: 7 (ref 3–11)
BUN: 22 mg/dL (ref 7–26)
CO2: 24 mmol/L (ref 22–29)
Calcium: 8.8 mg/dL (ref 8.4–10.4)
Chloride: 105 mmol/L (ref 98–109)
Creatinine: 1.36 mg/dL — ABNORMAL HIGH (ref 0.60–1.10)
GFR, Est AFR Am: 47 mL/min — ABNORMAL LOW (ref >60)
GFR, Estimated: 40 mL/min — ABNORMAL LOW (ref >60)
Glucose, Bld: 269 mg/dL — ABNORMAL HIGH (ref 70–140)
Potassium: 4.4 mmol/L (ref 3.5–5.1)
Sodium: 136 mmol/L (ref 136–145)
Total Bilirubin: 1.1 mg/dL (ref 0.2–1.2)
Total Protein: 8.1 g/dL (ref 6.4–8.3)

## 2017-08-30 LAB — CBC WITH DIFFERENTIAL (CANCER CENTER ONLY)
Basophils Absolute: 0 10*3/uL (ref 0.0–0.1)
Basophils Relative: 1 %
Eosinophils Absolute: 0.1 10*3/uL (ref 0.0–0.5)
Eosinophils Relative: 2 %
HCT: 31 % — ABNORMAL LOW (ref 34.8–46.6)
Hemoglobin: 11 g/dL — ABNORMAL LOW (ref 11.6–15.9)
Lymphocytes Relative: 22 %
Lymphs Abs: 1 10*3/uL (ref 0.9–3.3)
MCH: 33 pg (ref 25.1–34.0)
MCHC: 35.4 g/dL (ref 31.5–36.0)
MCV: 93.2 fL (ref 79.5–101.0)
Monocytes Absolute: 0.3 10*3/uL (ref 0.1–0.9)
Monocytes Relative: 8 %
Neutro Abs: 3.1 10*3/uL (ref 1.5–6.5)
Neutrophils Relative %: 67 %
Platelet Count: 166 10*3/uL (ref 145–400)
RBC: 3.33 MIL/uL — ABNORMAL LOW (ref 3.70–5.45)
RDW: 15.2 % — ABNORMAL HIGH (ref 11.2–14.5)
WBC Count: 4.5 10*3/uL (ref 3.9–10.3)

## 2017-08-30 LAB — CEA (IN HOUSE-CHCC): CEA (CHCC-In House): 19.85 ng/mL — ABNORMAL HIGH (ref 0.00–5.00)

## 2017-08-30 NOTE — Telephone Encounter (Signed)
Appointments scheduled AVS/Calendar printed per 3/20 los

## 2017-09-04 ENCOUNTER — Telehealth: Payer: Self-pay | Admitting: *Deleted

## 2017-09-04 NOTE — Telephone Encounter (Signed)
TCT patient regarding recent lab results from last week. Spoke with pt and informed her that her CEA remained slightly elevated-similar to last year. Pt voiced understanding. Per Dr. Ernestina Penna note last week, pt comes in yearly to see her and lab work. No change in this at this time.

## 2017-09-06 ENCOUNTER — Ambulatory Visit (INDEPENDENT_AMBULATORY_CARE_PROVIDER_SITE_OTHER): Payer: Medicare HMO | Admitting: Adult Health

## 2017-09-06 ENCOUNTER — Ambulatory Visit (INDEPENDENT_AMBULATORY_CARE_PROVIDER_SITE_OTHER)
Admission: RE | Admit: 2017-09-06 | Discharge: 2017-09-06 | Disposition: A | Discharge status: Home / Self Care | Payer: Medicare HMO | Source: Ambulatory Visit | Attending: Adult Health | Admitting: Adult Health

## 2017-09-06 ENCOUNTER — Encounter: Payer: Self-pay | Admitting: Adult Health

## 2017-09-06 VITALS — BP 126/70 | HR 77 | Ht 63.0 in | Wt 170.0 lb

## 2017-09-06 DIAGNOSIS — J439 Emphysema, unspecified: Secondary | ICD-10-CM | POA: Diagnosis not present

## 2017-09-06 DIAGNOSIS — J849 Interstitial pulmonary disease, unspecified: Secondary | ICD-10-CM | POA: Diagnosis not present

## 2017-09-06 IMAGING — DX DG CHEST 2V
2 series · 2 of 2 positions shown · non-contrast
Comparison: Chest x-ray of [DATE] and chest CT scan of
[DATE]

CLINICAL DATA: History of pulmonary fibrosis, COPD, current smoker.
No current complaints.

EXAM:
CHEST - 2 VIEW

[chest pa]
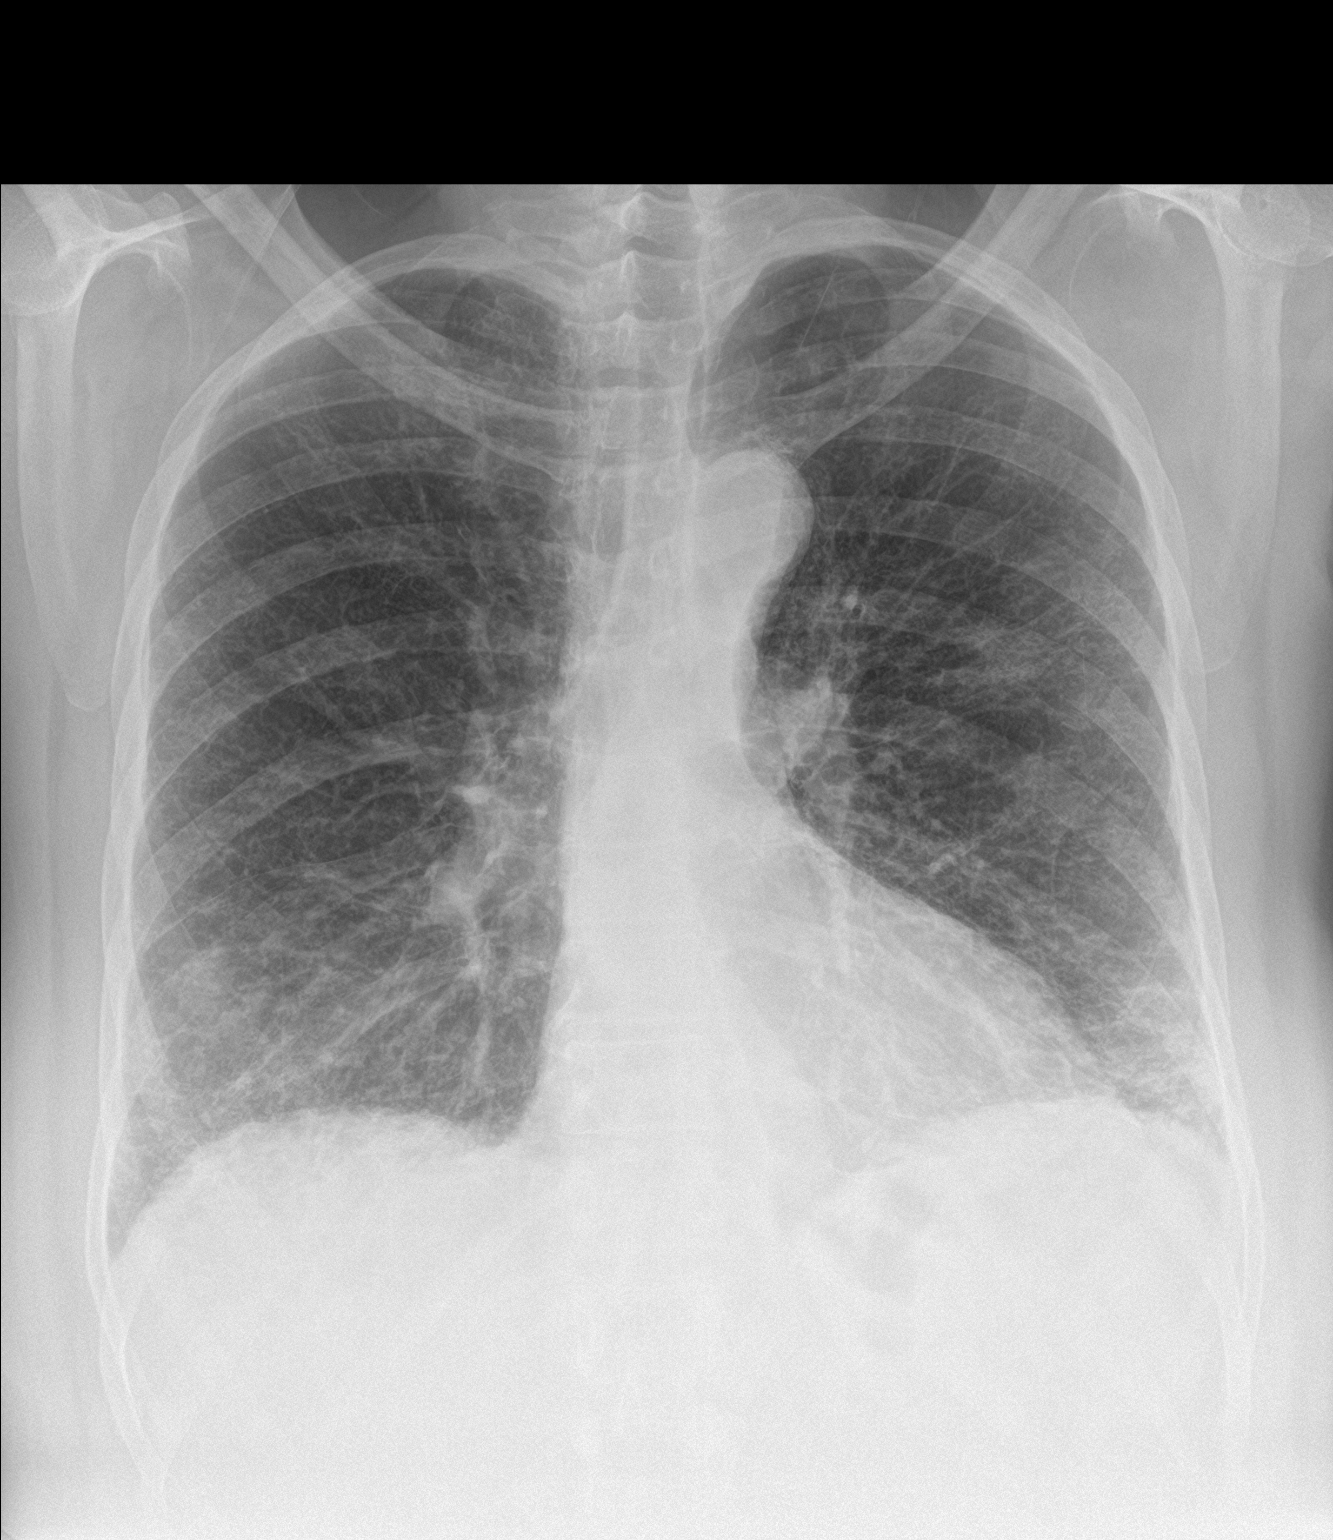

[chest lat]
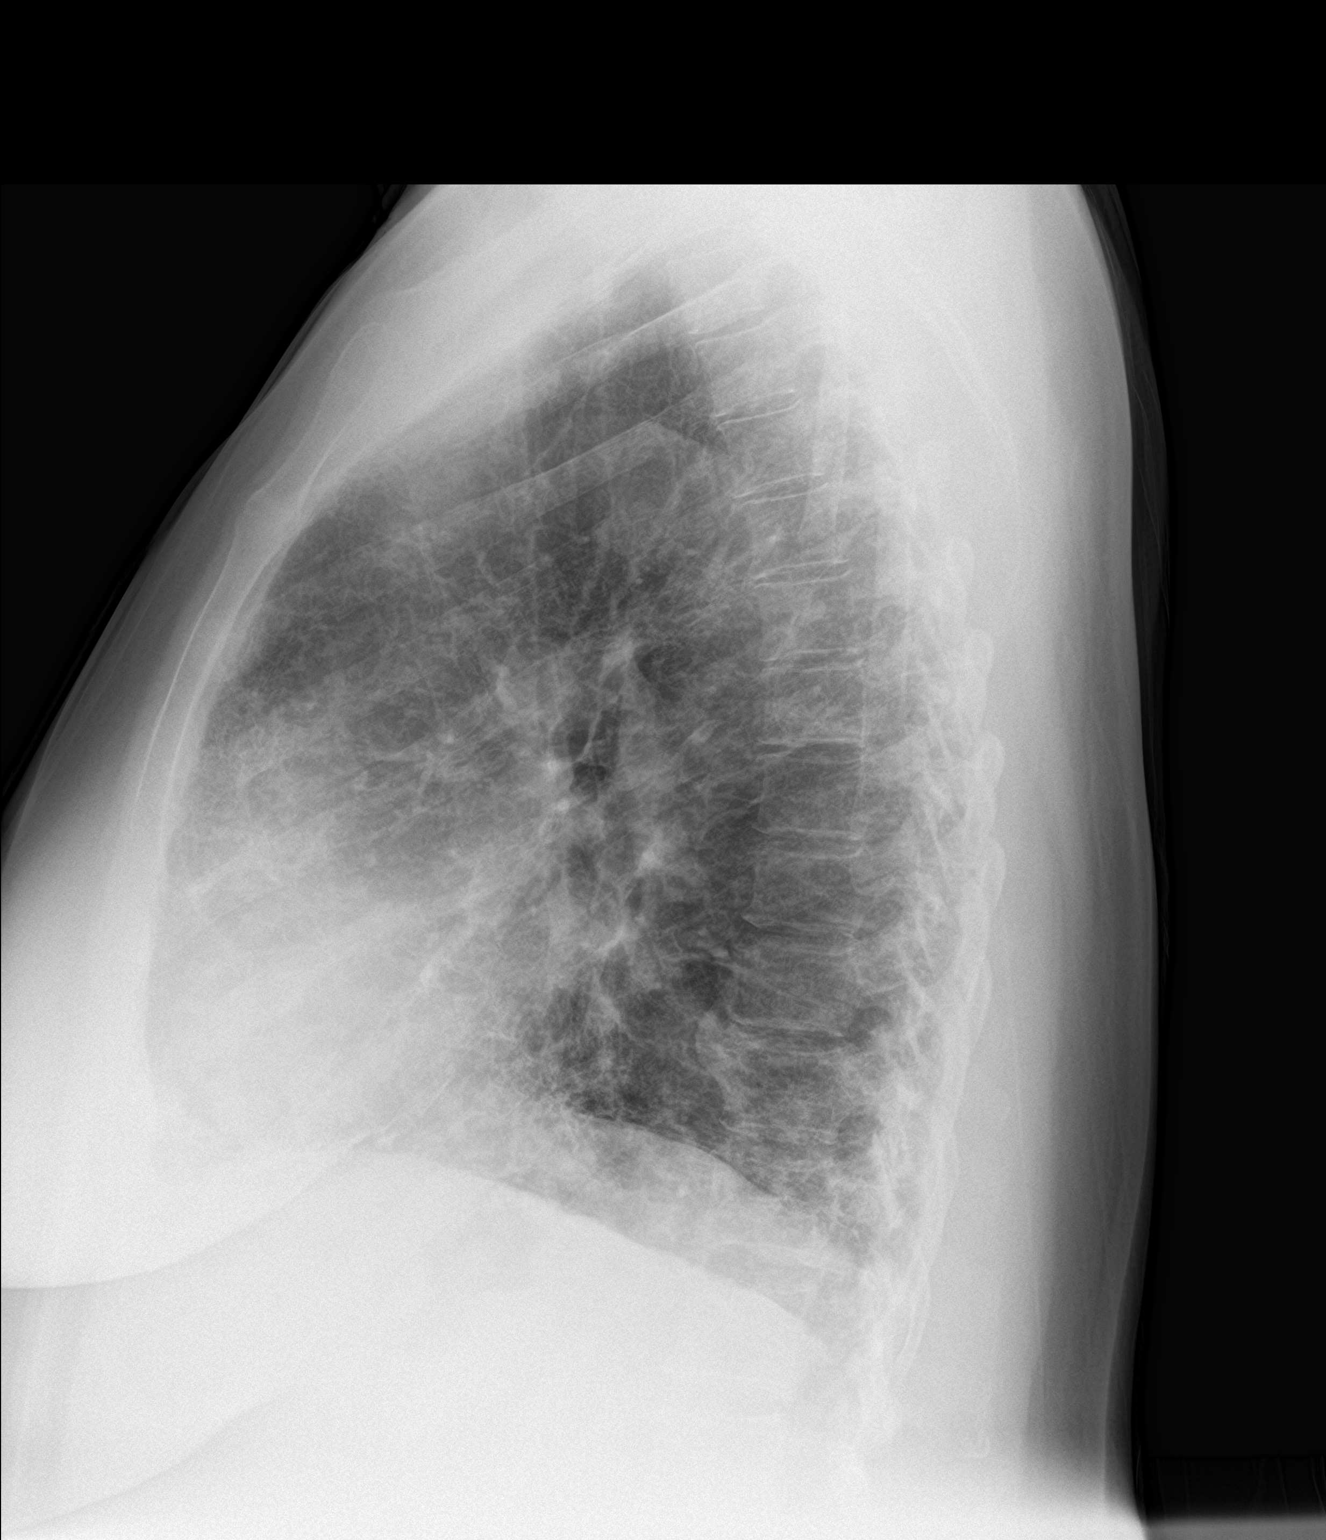

[2 of 2 positions shown; findings below may reference images not displayed]

FINDINGS: The lungs are adequately inflated. The interstitial markings are
increased bilaterally. This is especially prominent in the lower
lobes laterally. There is no alveolar infiltrate or pleural
effusion. The heart and pulmonary vascularity are normal. The
mediastinum is normal in width. The bony thorax exhibits no acute
abnormality.
IMPRESSION: Interval progression of the pulmonary fibrotic process bilaterally.
No acute pneumonia nor CHF.

## 2017-09-06 NOTE — Assessment & Plan Note (Signed)
ILD changes on CT chest . Autoimmune neg  Check PFT on return , consider adding antifibrotics on return  Check cxr today . , may need HRCT chest repeated.   Plan  Patient Instructions  Chest xray today  Follow up with Dr. Chase Caller in [facility] clinic in 6 weeks with PFT  Please contact office for sooner follow up if symptoms do not improve or worsen or seek emergency care

## 2017-09-06 NOTE — Patient Instructions (Signed)
Chest xray today  Follow up with Dr. Chase Caller in [facility] clinic in 6 weeks with PFT  Please contact office for sooner follow up if symptoms do not improve or worsen or seek emergency care

## 2017-09-06 NOTE — Assessment & Plan Note (Signed)
Smoking cessation  Check PFT on return  Consider adding maintenance therapy on return

## 2017-09-06 NOTE — Progress Notes (Signed)
_0  ID: Erica [name], female    DOB: [birth year], 62 y.o.   MRN: [id number]  Chief Complaint  Patient presents with  . Follow-up    ILD     Referring provider: Nolene Ebbs, MD  HPI: 62 yo female former smoker followed for COPD and ILD  PMH + for Rectal cancer (2014)   TEST  PFT  which showed mild COPD with an FEV1 of 1. 72 or 84% of predicted. Diffusion capacity was 61%.  sleep study was (-) for OSA HRCT chest done on 07/06/16 showed changes c/w IPF/UIP pattern, mildly worse that 2016 and 2015 ct scan.    09/06/2017 Follow up : COPD and ILD  Pt returns for 1 year follow up . She was last seen in March 2018 . Found to have mild COPD and ILD changes on CT chest that are mild worse than 2015 CT scan .  She had autoimmune labs that essentially neg. She did not keep follow up .  Recommended on smoking cessation.  Says that she has baseline dyspnea with activity none at rest , and dry cough .  Over last year feels dyspnea with activity has gotten worse. She gets winded easily .  No increased cough .  Still smoking, cessation discussed.    Allergies  Allergen Reactions  . Penicillins Hives, Itching and Swelling    Tongue swells up Has patient had a PCN reaction causing immediate rash, facial/tongue/throat swelling, SOB or lightheadedness with hypotension: Yes Has patient had a PCN reaction causing severe rash involving mucus membranes or skin necrosis: Yes Has patient had a PCN reaction that required hospitalization Yes Has patient had a PCN reaction occurring within the last 10 years: No If all of the above answers are "NO", then may proceed with Cephalosporin use.     Immunization History  Administered Date(s) Administered  . Influenza Split 03/13/2017  . Influenza-Unspecified 03/13/2016    Past Medical History:  Diagnosis Date  . Arthritis    especially in shoulders  . Asthma   . Depression   . Diabetes mellitus   . GERD (gastroesophageal reflux disease)   .  Headache(784.0)    "mild"  . Hypertension   . Mental disorder    depression  . Neuropathy    feet   . Pain    arthritis pain - takes tramadol as needed  . Rectal polyp    very little bleeding with bowel movements- no pain    Tobacco History: Social History   Tobacco Use  Smoking Status Former Smoker  . Packs/day: 0.25  . Years: 20.00  . Pack years: 5.00  . Types: Cigarettes  . Last attempt to quit: 07/15/2016  . Years since quitting: 1.1  Smokeless Tobacco Never Used  Tobacco Comment   started smoking again July 2018, 2 cigs daily   Counseling given: Not Answered Comment: started smoking again July 2018, 2 cigs daily   Outpatient Encounter Medications as of 09/06/2017  Medication Sig  . albuterol (PROVENTIL HFA;VENTOLIN HFA) 108 (90 BASE) MCG/ACT inhaler Inhale 2 puffs into the lungs every 4 (four) hours as needed for wheezing.  . fluticasone (FLONASE) 50 MCG/ACT nasal spray Place 2 sprays into both nostrils daily.  Marland Kitchen gabapentin (NEURONTIN) 300 MG capsule Take 300 mg by mouth 3 (three) times daily.   Marland Kitchen losartan-hydrochlorothiazide (HYZAAR) 50-12.5 MG tablet Take 1 tablet by mouth daily.  . montelukast (SINGULAIR) 10 MG tablet Take 1 tablet (10 mg total) by mouth at bedtime.  Marland Kitchen  Multiple Vitamin (MULTIVITAMIN WITH MINERALS) TABS tablet Take 1 tablet by mouth daily.  . NON FORMULARY Take 1 capsule by mouth 4 (four) times daily. FDgard for reflux.  . nortriptyline (PAMELOR) 25 MG capsule TK 1 C PO QHS PRF SLP  . pantoprazole (PROTONIX) 40 MG tablet Take 40 mg by mouth daily.  . simvastatin (ZOCOR) 10 MG tablet Take 31ms at bedtime  . sodium chloride (OCEAN) 0.65 % SOLN nasal spray Place 1 spray into both nostrils as needed for congestion.  .Marland KitchentiZANidine (ZANAFLEX) 4 MG tablet Take 4 mg by mouth 3 (three) times daily as needed for spasms.  .Nelva NaySOLOSTAR 300 UNIT/ML SOPN Inject 100 Units into the skin at bedtime.   . [DISCONTINUED] traMADol (ULTRAM) 50 MG tablet Take 50 mg  by mouth 2 (two) times daily as needed for pain.   No facility-administered encounter medications on file as of 09/06/2017.      Review of Systems  Constitutional:   No  weight loss, night sweats,  Fevers, chills, + fatigue, or  lassitude.  HEENT:   No headaches,  Difficulty swallowing,  Tooth/dental problems, or  Sore throat,                No sneezing, itching, ear ache, nasal congestion, post nasal drip,   CV:  No chest pain,  Orthopnea, PND, swelling in lower extremities, anasarca, dizziness, palpitations, syncope.   GI  No heartburn, indigestion, abdominal pain, nausea, vomiting, diarrhea, change in bowel habits, loss of appetite, bloody stools.   Resp: .  No chest wall deformity  Skin: no rash or lesions.  GU: no dysuria, change in color of urine, no urgency or frequency.  No flank pain, no hematuria   MS:  No joint pain or swelling.  No decreased range of motion.  No back pain.    Physical Exam  BP 126/70 (BP Location: Left Arm, Cuff Size: Normal)   Pulse 77   Ht _0  (1.6 m)   Wt 170 lb (77.1 kg)   SpO2 100%   BMI 30.11 kg/m   GEN: A/Ox3; pleasant , NAD   HEENT:  [place]/AT,  EACs-clear, TMs-wnl, NOSE-clear, THROAT-clear, no lesions, no postnasal drip or exudate noted.   NECK:  Supple w/ fair ROM; no JVD; normal carotid impulses w/o bruits; no thyromegaly or nodules palpated; no lymphadenopathy.    RESP  Faint BB crackles . no accessory muscle use, no dullness to percussion  CARD:  RRR, no m/r/g, no peripheral edema, pulses intact, no cyanosis or clubbing.  GI:   Soft & nt; nml bowel sounds; no organomegaly or masses detected.   Musco: Warm bil, no deformities or joint swelling noted.   Neuro: alert, no focal deficits noted.    Skin: Warm, no lesions or rashes    Lab Results:  CBC   BNP No results found for: BNP  ProBNP No results found for: PROBNP  Imaging: Dg Chest 2 View  Result Date: 09/06/2017 CLINICAL DATA:  History of pulmonary  fibrosis, COPD, current smoker. No current complaints. EXAM: CHEST - 2 VIEW COMPARISON:  Chest x-ray of June 02, 2016 and chest CT scan of July 06, 2016 FINDINGS: The lungs are adequately inflated. The interstitial markings are increased bilaterally. This is especially prominent in the lower lobes laterally. There is no alveolar infiltrate or pleural effusion. The heart and pulmonary vascularity are normal. The mediastinum is normal in width. The bony thorax exhibits no acute abnormality. IMPRESSION: Interval progression of the  pulmonary fibrotic process bilaterally. No acute pneumonia nor CHF. Electronically Signed   By: David  Martinique M.D.   On: 09/06/2017 12:24     Assessment & Plan:   COPD (chronic obstructive pulmonary disease) (HCC) Smoking cessation  Check PFT on return  Consider adding maintenance therapy on return    Interstitial lung disease ([place]) ILD changes on CT chest . Autoimmune neg  Check PFT on return , consider adding antifibrotics on return  Check cxr today . , may need HRCT chest repeated.   Plan  Patient Instructions  Chest xray today  Follow up with Dr. Chase Caller in ILD clinic in 6 weeks with PFT  Please contact office for sooner follow up if symptoms do not improve or worsen or seek emergency care         Rexene Edison, NP 09/06/2017

## 2017-09-12 NOTE — Progress Notes (Signed)
LMOM TCB x2

## 2017-09-15 ENCOUNTER — Encounter: Payer: Self-pay | Admitting: *Deleted

## 2017-09-15 NOTE — Progress Notes (Signed)
LMOM TCB x3

## 2017-09-19 ENCOUNTER — Ambulatory Visit: Payer: Medicare HMO

## 2017-10-23 ENCOUNTER — Other Ambulatory Visit: Payer: Self-pay | Admitting: Internal Medicine

## 2017-10-23 DIAGNOSIS — R05 Cough: Secondary | ICD-10-CM

## 2017-10-23 DIAGNOSIS — R059 Cough, unspecified: Secondary | ICD-10-CM

## 2017-10-24 ENCOUNTER — Encounter: Payer: Self-pay | Admitting: Internal Medicine

## 2017-10-24 ENCOUNTER — Ambulatory Visit (INDEPENDENT_AMBULATORY_CARE_PROVIDER_SITE_OTHER): Payer: Medicare HMO | Admitting: Internal Medicine

## 2017-10-24 ENCOUNTER — Ambulatory Visit: Payer: Medicare HMO | Admitting: Internal Medicine

## 2017-10-24 VITALS — BP 120/80 | HR 82 | Ht 63.0 in

## 2017-10-24 DIAGNOSIS — J439 Emphysema, unspecified: Secondary | ICD-10-CM

## 2017-10-24 DIAGNOSIS — J849 Interstitial pulmonary disease, unspecified: Secondary | ICD-10-CM | POA: Diagnosis not present

## 2017-10-24 DIAGNOSIS — J019 Acute sinusitis, unspecified: Secondary | ICD-10-CM

## 2017-10-24 DIAGNOSIS — Z87891 Personal history of nicotine dependence: Secondary | ICD-10-CM

## 2017-10-24 DIAGNOSIS — R059 Cough, unspecified: Secondary | ICD-10-CM

## 2017-10-24 DIAGNOSIS — R05 Cough: Secondary | ICD-10-CM

## 2017-10-24 LAB — PULMONARY FUNCTION TEST
FEF 25-75 Pre: 1.25 L/sec
FEF2575-%Pred-Pre: 64 %
FEV1-%Pred-Pre: 95 %
FEV1-Pre: 1.93 L
FEV1FVC-%Pred-Pre: 87 %
FEV6-%Pred-Pre: 112 %
FEV6-Pre: 2.78 L
FEV6FVC-%Pred-Pre: 103 %
FVC-%Pred-Pre: 108 %
FVC-Pre: 2.79 L
Pre FEV1/FVC ratio: 69 %
Pre FEV6/FVC Ratio: 100 %

## 2017-10-24 MED ORDER — DOXYCYCLINE HYCLATE 100 MG PO TABS: 100.0000 mg | ORAL_TABLET | Freq: Two times a day (BID) | ORAL | 0 refills | Status: DC

## 2017-10-24 MED ORDER — TIOTROPIUM BROMIDE MONOHYDRATE 1.25 MCG/ACT IN AERS: 2.0000 | INHALATION_SPRAY | Freq: Every day | RESPIRATORY_TRACT | 5 refills | Status: DC

## 2017-10-24 NOTE — Progress Notes (Signed)
Subjective:     Patient ID: Erica [name], female   DOB: November 28, 1955, 62 y.o.   MRN: [id number]  HPI   This is the case of Erica [name], 62 y.o. Female, who was referred by Dr. Lorella Nimrod in consultation regarding abnormal chest ct scan.   As you very well know, patient smoked 1 pack per week since her 62s, curently down to a cigarette a day, was diagnosed with asthma/copd several yrs ago. Uses alb MDI prn, usually 1-2 x/week. She gets more than SOB with more than ADLs. She is not on o2.   She had rectal CA in 2014 for which she had surgery. Still in remission.  She had a chest ct scan in 01/2015 as part of CA surveillance.  That showed several pulm nodules (subcm) which have been stable, and some smaller.  Also with increased markings at the bases.   She had sinus issues in December.  Wen tot ER as she was not getting better.  CXR showed inc interstitial markings, hence the consult today. She is almost back to her baseline.   She has GERD, controlled with meds. She coughs daily, coughs in sleep. Cough worse at night.  On and off wheezing at HS.   Her sinus issues are better. Has PND.    Has snoring, gaspin, choking, unrefreshed sleep.  Wakes up tired and fatigued.  ESS 10. Hypersomnia affects her fxnality.    ROV  08/12/16 Patient returns to the office as follow-up after her tests. Since last seen, she has quit smoking. She had PFT  which showed mild COPD with an FEV1 of 1. 72 or 84% of predicted. Diffusion capacity was 61%. Her sleep study was (-) for OSA. She is able to do her ADLs without much difficulty. Gets winded with more than ADLs. Her reflux is controlled with medicines and diet changes. Her cough is also stable.     09/06/2017 Follow up : COPD and ILD  Pt returns for 1 year follow up . She was last seen in March 2018 . Found to have mild COPD and ILD changes on CT chest that are mild worse than 2015 CT scan .  She had autoimmune labs that essentially neg. She did not keep  follow up .  Recommended on smoking cessation.  Says that she has baseline dyspnea with activity none at rest , and dry cough .  Over last year feels dyspnea with activity has gotten worse. She gets winded easily .  No increased cough .  Still smoking, cessation discussed.   OV 10/24/2017  Chief Complaint  Patient presents with  . Follow-up    PFT was attempted today but pt unable to complete due to some sinus problems. Pt has some yellow to green mucus postnasally and also is coughing up a lot of mucus as well. Pt also states she has increased SOB due to symptoms.    Erica [name] presents to the interstitial lung disease clinic for evaluation of potential interstitial lung disease.  She is followed in the general pulmonary clinic as stated above.  Her other issues ongoing smoking.  She is somewhat of a poor historian and as soon as I walked in she reported a new complaint of acute sinus congestion for the last 2 weeks after having picked up a cold from her sister.  Also the ongoing spring pollen season is making things worse.  She is coughing up some green sputum.  She feels this needs to be treated  by me and she wants an antibiotic for this.  There is no associated wheezing or cough or shortness of breath because of this.  We discussed her underlying lung disease and she tells me that she is aware that she has COPD/emphysema.  But she is totally unaware about pulmonary fibrosis or interstitial lung disease.  She tells me that overall compared to last year she is stable and she only has mild dyspnea on exertion without much of a cough.  There is no chest pain no orthopnea proximal nocturnal dyspnea wheezing or hemoptysis or weight loss.  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  Only takes albuerol as needed.  Her last CT scan of the chest high resolution was in January 2018 that thoracic radiology Dr. Lorin Picket described as indicative of UIP.  I  personally visualized the CT and I personally think this is indeterminate for UIP.  There is bilateral bibasal craniocaudal gradients reticular abnormalities but I am not so sure that these are subpleural and more of I do not see clear-cut traction bronchiectasis or honeycombing although I will agree that there is no groundglass.   She has ongoing smoking    [name], Erica [name] (MRN [id number]) as of 10/24/2017 16:28  Ref. Range 08/12/2016 14:23 09/07/2016 11:20  Anit Nuclear Antibody(ANA) Latest Ref Range: NEGATIVE  NEG   ANCA Proteinase 3 Latest Ref Range: 0.0 - 3.5 U/mL  <3.5  Anti JO-1 Latest Ref Range: 0.0 - 0.9 AI <1.7 <6.1  Cyclic Citrullin Peptide Ab Latest Units: Units <16   ds DNA Ab Latest Units: IU/mL <1   Myeloperoxidase Ab Latest Ref Range: 0.0 - 9.0 U/mL  <9.0  Cytoplasmic (C-ANCA) Latest Ref Range: <1:20  1:40 (H)   Scleroderma (Scl-70) (ENA) Antibody, IgG Latest Ref Range: <1.0 NEG AI  <1.0 NEG      [name], Erica [name] (MRN [id number]) as of 10/24/2017 16:28  Ref. Range 08/11/2016 10:13 10/24/2017 14:58  FVC-Pre Latest Units: L 2.56 2.79  FVC-%Pred-Pre Latest Units: % 98 108  [name], Erica [name] (MRN [id number]) as of 10/24/2017 16:28  Ref. Range 08/11/2016 10:13  DLCO cor Latest Units: ml/min/mmHg 14.73  DLCO cor % pred Latest Units: % 60    Simple office walk 185 feet x  3 laps goal with forehead probe 10/24/2017   O2 used Room air  Number laps completed 3  Comments about pace Normal pace  Resting Pulse Ox/HR 97% and 77/min  Final Pulse Ox/HR 97% and 103/min  Desaturated </= 88% no  Desaturated <= 3% points no  Got Tachycardic >/= 90/min yes  Symptoms at end of test No comment  Miscellaneous comments No comment from CMA      has a past medical history of Arthritis, Asthma, Depression, Diabetes mellitus, GERD (gastroesophageal reflux disease), Headache(784.0), Hypertension, Mental disorder, Neuropathy, Pain, and Rectal polyp.   reports that she has  been smoking cigarettes.  She has a 5.00 pack-year smoking history. She has never used smokeless tobacco.  Past Surgical History:  Procedure Laterality Date  . ANAL RECTAL MANOMETRY N/A 07/13/2016   Procedure: ANO RECTAL MANOMETRY;  Surgeon: Leighton Ruff, MD;  Location: [facility];  Service: Endoscopy;  Laterality: N/A;  . CESAREAN SECTION    . EUS N/A 11/21/2012   Procedure: LOWER ENDOSCOPIC ULTRASOUND (EUS);  Surgeon: Arta Silence, MD;  Location: [facility];  Service: Endoscopy;  Laterality: N/A;  . FLEXIBLE SIGMOIDOSCOPY N/A 11/21/2012  Procedure: FLEXIBLE SIGMOIDOSCOPY;  Surgeon: Arta Silence, MD;  Location: [facility];  Service: Endoscopy;  Laterality: N/A;  . FLEXIBLE SIGMOIDOSCOPY N/A 01/28/2013   Procedure: FLEXIBLE SIGMOIDOSCOPY;  Surgeon: Leighton Ruff, MD;  Location: [facility];  Service: Endoscopy;  Laterality: N/A;  . LAPAROSCOPIC LOW ANTERIOR RESECTION N/A 01/29/2013   Procedure: LAPAROSCOPIC LOW ANTERIOR RESECTION, Rigid Proctoscopy;  Surgeon: Leighton Ruff, MD;  Location: [facility];  Service: General;  Laterality: N/A;  . LAPAROSCOPIC SIGMOID COLECTOMY N/A 11/14/2012   Procedure: DIAGNOSTIC LAPAROSCOPY AND SIGMOIDMOIDOSCOPY ;  Surgeon: Rolm Bookbinder, MD;  Location: [facility];  Service: General;  Laterality: N/A;  . RECTAL ULTRASOUND N/A 07/13/2016   Procedure: RECTAL ULTRASOUND;  Surgeon: Leighton Ruff, MD;  Location: [facility];  Service: Endoscopy;  Laterality: N/A;  . TONSILLECTOMY    . TONSILLECTOMY AND ADENOIDECTOMY      Allergies  Allergen Reactions  . Penicillins Hives, Itching and Swelling    Tongue swells up Has patient had a PCN reaction causing immediate rash, facial/tongue/throat swelling, SOB or lightheadedness with hypotension: Yes Has patient had a PCN reaction causing severe rash involving mucus membranes or skin necrosis: Yes Has patient had a PCN reaction that required hospitalization Yes Has patient had a PCN reaction occurring within the last 10  years: No If all of the above answers are "NO", then may proceed with Cephalosporin use.     Immunization History  Administered Date(s) Administered  . Influenza Split 03/13/2017  . Influenza-Unspecified 03/13/2016    Family History  Problem Relation Age of Onset  . Hypertension Father   . Stroke Father   . Diabetes Father   . Heart disease Mother   . Hypertension Mother   . Hyperlipidemia Mother   . Hypertension Sister   . Hypertension Brother   . Hypertension Sister   . Hypertension Brother   . Cancer Other 47       colon cancer   . Cancer Maternal Aunt        cancer, unknown type   . Cancer Maternal Uncle        bone cancer   . Cancer Paternal Aunt        lung cancer  . Cancer Paternal Uncle        lung cancer  . Cancer Maternal Uncle        colon cancer and prostate cancer   . Cancer Maternal Uncle        prostate cancer      Current Outpatient Medications:  .  albuterol (PROVENTIL HFA;VENTOLIN HFA) 108 (90 BASE) MCG/ACT inhaler, Inhale 2 puffs into the lungs every 4 (four) hours as needed for wheezing., Disp: , Rfl:  .  amLODipine (NORVASC) 10 MG tablet, TK 1 T PO QD, Disp: , Rfl: 2 .  fluticasone (FLONASE) 50 MCG/ACT nasal spray, Place 2 sprays into both nostrils daily., Disp: 16 g, Rfl: 2 .  gabapentin (NEURONTIN) 300 MG capsule, Take 300 mg by mouth 3 (three) times daily. , Disp: , Rfl:  .  losartan-hydrochlorothiazide (HYZAAR) 50-12.5 MG tablet, Take 1 tablet by mouth daily., Disp: , Rfl:  .  Multiple Vitamin (MULTIVITAMIN WITH MINERALS) TABS tablet, Take 1 tablet by mouth daily., Disp: , Rfl:  .  NON FORMULARY, Take 1 capsule by mouth 4 (four) times daily. FDgard for reflux., Disp: , Rfl:  .  nortriptyline (PAMELOR) 25 MG capsule, TK 1 C PO QHS PRF SLP, Disp: , Rfl: 5 .  pantoprazole (PROTONIX) 40 MG tablet, Take 40 mg  by mouth daily., Disp: , Rfl:  .  simvastatin (ZOCOR) 10 MG tablet, Take 55ms at bedtime, Disp: , Rfl: 5 .  tiZANidine (ZANAFLEX) 4 MG  tablet, Take 4 mg by mouth 3 (three) times daily as needed for spasms., Disp: , Rfl: 5 .  TOUJEO SOLOSTAR 300 UNIT/ML SOPN, Inject 100 Units into the skin at bedtime. , Disp: , Rfl: 1 .  traMADol (ULTRAM) 50 MG tablet, TK 1 T PO Q 6 H PRF SEVERE PAIN, Disp: , Rfl: 0 .  montelukast (SINGULAIR) 10 MG tablet, Take 1 tablet (10 mg total) by mouth at bedtime. (Patient not taking: Reported on 10/24/2017), Disp: 30 tablet, Rfl: 0 .  sodium chloride (OCEAN) 0.65 % SOLN nasal spray, Place 1 spray into both nostrils as needed for congestion. (Patient not taking: Reported on 10/24/2017), Disp: 88 mL, Rfl: 0    Review of Systems     Objective:   Physical Exam  Constitutional: She is oriented to person, place, and time. She appears well-developed and well-nourished. No distress.  HENT:  Head: Normocephalic and atraumatic.  Right Ear: External ear normal.  Left Ear: External ear normal.  Mouth/Throat: Oropharynx is clear and moist. No oropharyngeal exudate.  Runny nose, congested naris  Eyes: Pupils are equal, round, and reactive to light. Conjunctivae and EOM are normal. Right eye exhibits no discharge. Left eye exhibits no discharge. No scleral icterus.  Neck: Normal range of motion. Neck supple. No JVD present. No tracheal deviation present. No thyromegaly present.  Cardiovascular: Normal rate, regular rhythm, normal heart sounds and intact distal pulses. Exam reveals no gallop and no friction rub.  No murmur heard. Pulmonary/Chest: Effort normal and breath sounds normal. No respiratory distress. She has no wheezes. She has no rales. She exhibits no tenderness.  Abdominal: Soft. Bowel sounds are normal. She exhibits no distension and no mass. There is no tenderness. There is no rebound and no guarding.  Musculoskeletal: Normal range of motion. She exhibits no edema or tenderness.  Lymphadenopathy:    She has no cervical adenopathy.  Neurological: She is alert and oriented to person, place, and time.  She has normal reflexes. No cranial nerve deficit. She exhibits normal muscle tone. Coordination normal.  Skin: Skin is warm and dry. No rash noted. She is not diaphoretic. No erythema. No pallor.  Psychiatric: She has a normal mood and affect. Her behavior is normal. Judgment and thought content normal.  Vitals reviewed.  Today's Vitals   10/24/17 1548  BP: 120/80  Pulse: 82  SpO2: 99%  Height: _0  (1.6 m)       Assessment:       ICD-10-CM   1. Acute non-recurrent sinusitis, unspecified location J01.90   2. Pulmonary emphysema, unspecified emphysema type ([place] J43.9   3. Interstitial lung disease ([place] J84.9   4. Smoking history Z87.891   5. Interstitial pulmonary disease ([place] J84.9        Plan:     Acute non-recurrent sinusitis, unspecified location - new - you got it from sick contact of your sister and pollen season - Take doxycycline 1037mpo twice daily x 5 days; take after meals and avoid sunlight    Pulmonary emphysema, unspecified emphysema type ([place]-ok to use albuterol byut only as needed - start spiriva respimat daily - take sample if we have it, will send script  [facility]- this was [facility] Jan 2018 of uknown cause and unspecificed type - unclear if you have it  now based on walk test and exam with the lack of crackles - in 4-6 weeks do HRCT supine and prone  Smoking history - need to quit  Followup - return in 4-6 weeks to regular clinic 15 min slot but after HRCT    Dr. Brand Males, M.D., [facility].C.P Pulmonary and Critical Care Medicine Staff Physician, [place] Director - Interstitial Lung Disease  Program  Pulmonary [place] at [place], [place], [address]  Pager: [phone], If no answer or between  15:00h - 7:00h: call [phone] Telephone: [phone]

## 2017-10-24 NOTE — Patient Instructions (Addendum)
Acute non-recurrent sinusitis, unspecified location - you got it from sick contact of your sister and pollen season - Take doxycycline 150m po twice daily x 5 days; take after meals and avoid sunlight    Pulmonary emphysema, unspecified emphysema type ([place] -ok to use albuterol byut only as needed - start spiriva respimat daily - take sample if we have it, will send script  Interstitial lung disease ([place] - this was there Jan 2018 of uknown cause and unspecificed type - unclear if you have it now based on walk test and exam - in 4-6 weeks do HRCT supine and prone  Smoking history - need to quit  Followup - return in 4-6 weeks to regular clinic 15 min slot but after HRCT

## 2017-10-24 NOTE — Progress Notes (Signed)
Patient could not completed the spiro in beginning of the PFT. Stopped PFT due to not feeling well. Advised MR nurse.

## 2017-11-03 ENCOUNTER — Inpatient Hospital Stay: Admission: RE | Admit: 2017-11-03 | Payer: Medicare HMO | Source: Ambulatory Visit

## 2017-11-22 ENCOUNTER — Inpatient Hospital Stay: Admission: RE | Admit: 2017-11-22 | Payer: Medicare HMO | Source: Ambulatory Visit

## 2018-01-12 ENCOUNTER — Ambulatory Visit: Payer: Medicare HMO | Admitting: Pulmonary Disease

## 2018-03-08 ENCOUNTER — Ambulatory Visit (INDEPENDENT_AMBULATORY_CARE_PROVIDER_SITE_OTHER): Payer: Medicare HMO | Admitting: Primary Care

## 2018-03-08 ENCOUNTER — Encounter: Payer: Self-pay | Admitting: Primary Care

## 2018-03-08 DIAGNOSIS — Z720 Tobacco use: Secondary | ICD-10-CM

## 2018-03-08 DIAGNOSIS — J849 Interstitial pulmonary disease, unspecified: Secondary | ICD-10-CM

## 2018-03-08 MED ORDER — ALBUTEROL SULFATE (2.5 MG/3ML) 0.083% IN NEBU: 2.5000 mg | INHALATION_SOLUTION | Freq: Four times a day (QID) | RESPIRATORY_TRACT | 12 refills | Status: DC | PRN

## 2018-03-08 MED ORDER — TIOTROPIUM BROMIDE MONOHYDRATE 1.25 MCG/ACT IN AERS: 2.0000 | INHALATION_SPRAY | Freq: Every day | RESPIRATORY_TRACT | 0 refills | Status: DC

## 2018-03-08 MED ORDER — PREDNISONE 10 MG PO TABS: ORAL_TABLET | ORAL | 0 refills | Status: DC

## 2018-03-08 MED ORDER — MONTELUKAST SODIUM 10 MG PO TABS: 10.0000 mg | ORAL_TABLET | Freq: Every day | ORAL | 5 refills | Status: DC

## 2018-03-08 MED ORDER — SALINE SPRAY 0.65 % NA SOLN: 1.0000 | NASAL | 5 refills | Status: DC | PRN

## 2018-03-08 NOTE — Patient Instructions (Addendum)
Start short course of oral steroid  Referral to DME company for new nebulizer start Use Albuterol nebulizer every 4-6 hours for sob/wheezing    Please reschedule HRCT  Needs follow-up with Dr. Chase Caller only in 4 weeks (after completed HRCT)

## 2018-03-08 NOTE — Assessment & Plan Note (Signed)
Recently quit smoking, congratulated on her hard work

## 2018-03-08 NOTE — Assessment & Plan Note (Signed)
Needs to complete HRCT and follow back with Dr. Chase Caller

## 2018-03-08 NOTE — Progress Notes (Signed)
_0  ID: Erica [name], female    DOB: Oct 06, 1955, 61 y.o.   MRN: [id number]  Chief Complaint  Patient presents with  . Acute Visit    SOB and cough with white to yellow mucus with waking at night for a few months    Referring provider: Nolene Ebbs, MD  HPI: 62 year old female, former smoker (quit 12/2017). PMH mild COPD, interstitial lung disease, GERD, rectal cancer. Patient of Dr. Chase Caller, last seen in May 2019. Ordered for HRCT and recommended 4-6 week follow, neither of which have been done.   10/24/17- OV Ramaswamy  Erica [name] presents to the interstitial lung disease clinic for evaluation of potential interstitial lung disease.  She is followed in the general pulmonary clinic as stated above.  Her other issues ongoing smoking.  She is somewhat of a poor historian and as soon as I walked in she reported a new complaint of acute sinus congestion for the last 2 weeks after having picked up a cold from her sister.  Also the ongoing spring pollen season is making things worse.  She is coughing up some green sputum.  She feels this needs to be treated by me and she wants an antibiotic for this.  There is no associated wheezing or cough or shortness of breath because of this.  We discussed her underlying lung disease and she tells me that she is aware that she has COPD/emphysema.  But she is totally unaware about pulmonary fibrosis or interstitial lung disease.  She tells me that overall compared to last year she is stable and she only has mild dyspnea on exertion without much of a cough.  There is no chest pain no orthopnea proximal nocturnal dyspnea wheezing or hemoptysis or weight loss.  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  Only takes albuerol as needed.  Her last CT scan of the chest high resolution was in January 2018 that thoracic radiology Dr. Lorin Picket described as indicative of UIP.  I personally visualized the CT and I  personally think this is indeterminate for UIP.  There is bilateral bibasal craniocaudal gradients reticular abnormalities but I am not so sure that these are subpleural and more of I do not see clear-cut traction bronchiectasis or honeycombing although I will agree that there is no groundglass. She has ongoing smoking.   03/08/2018  Patient presents today with complaints of cough and shortness of breath x2 months. Recently quit smoking. States that she missed HRCT because she was sick. Cough is described as productive with foamy white/light yellow coloring. She was given Norel AD for cough and congestion. Completed a course of doxycyline.Gets tired when walking to the car or down an asile in the grocery store. Sob with stairs only. Continues Spiriva respimat, using rescue inhaler occasionally with no real improvement.    Testing reviewed: > PFTs 08/11/17- mild COPD or emphysema/ FVC 2.99 (114%), FEV1 (2.01) ratio 67 > CXR 09/06/17- interval progression of the pulmonary fibrotic process bilaterally. No acute PNA or CHF.  > PFTs 10/25/27- FVC 2.79 (108%), FEV1 1.93 (95%), ratio 69 (pre-bronchodilator only)    Allergies  Allergen Reactions  . Penicillins Hives, Itching and Swelling    Tongue swells up Has patient had a PCN reaction causing immediate rash, facial/tongue/throat swelling, SOB or lightheadedness with hypotension: Yes Has patient had a PCN reaction causing severe rash involving mucus membranes or skin necrosis: Yes Has patient had a PCN reaction that  required hospitalization Yes Has patient had a PCN reaction occurring within the last 10 years: No If all of the above answers are "NO", then may proceed with Cephalosporin use.     Immunization History  Administered Date(s) Administered  . Influenza Split 03/13/2017  . Influenza-Unspecified 03/13/2016    Past Medical History:  Diagnosis Date  . Arthritis    especially in shoulders  . Asthma   . Depression   . Diabetes  mellitus   . GERD (gastroesophageal reflux disease)   . Headache(784.0)    "mild"  . Hypertension   . Mental disorder    depression  . Neuropathy    feet   . Pain    arthritis pain - takes tramadol as needed  . Rectal polyp    very little bleeding with bowel movements- no pain    Tobacco History: Social History   Tobacco Use  Smoking Status Former Smoker  . Packs/day: 0.25  . Years: 20.00  . Pack years: 5.00  . Types: Cigarettes  . Last attempt to quit: 01/05/2018  . Years since quitting: 0.1  Smokeless Tobacco Never Used  Tobacco Comment   started smoking again July 2018, 2 cigs daily   Counseling given: Not Answered Comment: started smoking again July 2018, 2 cigs daily   Outpatient Medications Prior to Visit  Medication Sig Dispense Refill  . albuterol (PROVENTIL HFA;VENTOLIN HFA) 108 (90 BASE) MCG/ACT inhaler Inhale 2 puffs into the lungs every 4 (four) hours as needed for wheezing.    Marland Kitchen amLODipine (NORVASC) 10 MG tablet TK 1 T PO QD  2  . fluticasone (FLONASE) 50 MCG/ACT nasal spray Place 2 sprays into both nostrils daily. 16 g 2  . gabapentin (NEURONTIN) 300 MG capsule Take 300 mg by mouth 3 (three) times daily.     Marland Kitchen losartan-hydrochlorothiazide (HYZAAR) 50-12.5 MG tablet Take 1 tablet by mouth daily.    . Multiple Vitamin (MULTIVITAMIN WITH MINERALS) TABS tablet Take 1 tablet by mouth daily.    . NON FORMULARY Take 1 capsule by mouth 4 (four) times daily. FDgard for reflux.    . nortriptyline (PAMELOR) 25 MG capsule TK 1 C PO QHS PRF SLP  5  . pantoprazole (PROTONIX) 40 MG tablet Take 40 mg by mouth daily.    . simvastatin (ZOCOR) 10 MG tablet Take 88ms at bedtime  5  . Tiotropium Bromide Monohydrate (SPIRIVA RESPIMAT) 1.25 MCG/ACT AERS Inhale 2 puffs into the lungs daily. 1 Inhaler 5  . tiZANidine (ZANAFLEX) 4 MG tablet Take 4 mg by mouth 3 (three) times daily as needed for spasms.  5  . TOUJEO SOLOSTAR 300 UNIT/ML SOPN Inject 100 Units into the skin at  bedtime.   1  . doxycycline (VIBRA-TABS) 100 MG tablet Take 1 tablet (100 mg total) by mouth 2 (two) times daily. 10 tablet 0  . montelukast (SINGULAIR) 10 MG tablet Take 1 tablet (10 mg total) by mouth at bedtime. (Patient not taking: Reported on 10/24/2017) 30 tablet 0  . sodium chloride (OCEAN) 0.65 % SOLN nasal spray Place 1 spray into both nostrils as needed for congestion. (Patient not taking: Reported on 10/24/2017) 88 mL 0  . traMADol (ULTRAM) 50 MG tablet TK 1 T PO Q 6 H PRF SEVERE PAIN  0   No facility-administered medications prior to visit.     Review of Systems  Review of Systems  Constitutional: Negative.   HENT: Negative.   Respiratory: Positive for cough and shortness of breath. Negative  for wheezing.   Cardiovascular: Negative.     Physical Exam  BP 138/84 (BP Location: Left Arm, Cuff Size: Normal)   Pulse 80   Ht _0  (1.6 m)   Wt 173 lb (78.5 kg)   SpO2 98%   BMI 30.65 kg/m  Physical Exam  Constitutional: She appears well-developed and well-nourished.  HENT:  Head: Normocephalic and atraumatic.  Eyes: Pupils are equal, round, and reactive to light. EOM are normal.  Neck: Normal range of motion. Neck supple.  Cardiovascular: Normal rate and regular rhythm.  Pulmonary/Chest: Effort normal. No respiratory distress. She has no wheezes.  Clear upper lobes, fine rales bases.   Skin: Skin is warm and dry.  Psychiatric: She has a normal mood and affect. Her behavior is normal. Judgment and thought content normal.     Lab Results:  CBC    Component Value Date/Time   WBC 4.5 08/30/2017 0905   WBC 4.4 03/02/2017 1428   WBC 5.5 06/02/2016 2017   RBC 3.33 (L) 08/30/2017 0905   HGB 11.0 (L) 08/30/2017 0905   HGB 12.5 03/02/2017 1428   HCT 31.0 (L) 08/30/2017 0905   HCT 35.4 03/02/2017 1428   PLT 166 08/30/2017 0905   PLT 192 03/02/2017 1428   MCV 93.2 08/30/2017 0905   MCV 94.3 03/02/2017 1428   MCH 33.0 08/30/2017 0905   MCHC 35.4 08/30/2017 0905   RDW  15.2 (H) 08/30/2017 0905   RDW 14.6 (H) 03/02/2017 1428   LYMPHSABS 1.0 08/30/2017 0905   LYMPHSABS 1.1 03/02/2017 1428   MONOABS 0.3 08/30/2017 0905   MONOABS 0.4 03/02/2017 1428   EOSABS 0.1 08/30/2017 0905   EOSABS 0.1 03/02/2017 1428   BASOSABS 0.0 08/30/2017 0905   BASOSABS 0.1 03/02/2017 1428    BMET    Component Value Date/Time   NA 136 08/30/2017 0905   NA 131 (L) 03/02/2017 1428   K 4.4 08/30/2017 0905   K 4.8 03/02/2017 1428   CL 105 08/30/2017 0905   CO2 24 08/30/2017 0905   CO2 24 03/02/2017 1428   GLUCOSE 269 (H) 08/30/2017 0905   GLUCOSE 482 (H) 03/02/2017 1428   BUN 22 08/30/2017 0905   BUN 20.0 03/02/2017 1428   CREATININE 1.36 (H) 08/30/2017 0905   CREATININE 1.7 (H) 03/02/2017 1428   CALCIUM 8.8 08/30/2017 0905   CALCIUM 9.2 03/02/2017 1428   GFRNONAA 40 (L) 08/30/2017 0905   GFRAA 47 (L) 08/30/2017 0905    BNP No results found for: BNP  ProBNP No results found for: PROBNP  Imaging: No results found.   Assessment & Plan:   COPD (chronic obstructive pulmonary disease) (HCC) Mild COPD exacerbation Tx prednisone taper and albuterol neb No clinical concern for bacterial infection, defer abx at this time  Continue Spirivia respimat 2 puffs daily (sample given)  Interstitial lung disease ([place]) Needs to complete HRCT and follow back with Dr. Chase Caller   Tobacco user Recently quit smoking, congratulated on her hard work      Martyn Ehrich, NP 03/08/2018

## 2018-03-08 NOTE — Assessment & Plan Note (Addendum)
Mild COPD exacerbation Tx prednisone taper and albuterol neb No clinical concern for bacterial infection, defer abx at this time  Continue Spirivia respimat 2 puffs daily (sample given)

## 2018-03-20 ENCOUNTER — Inpatient Hospital Stay: Admission: RE | Admit: 2018-03-20 | Payer: Medicare HMO | Source: Ambulatory Visit

## 2018-04-09 ENCOUNTER — Ambulatory Visit (INDEPENDENT_AMBULATORY_CARE_PROVIDER_SITE_OTHER)
Admission: RE | Admit: 2018-04-09 | Discharge: 2018-04-09 | Disposition: A | Discharge status: Home / Self Care | Payer: Medicare HMO | Source: Ambulatory Visit | Attending: Primary Care | Admitting: Primary Care

## 2018-04-09 DIAGNOSIS — J849 Interstitial pulmonary disease, unspecified: Secondary | ICD-10-CM

## 2018-04-09 IMAGING — CT CT CHEST HIGH RESOLUTION W/O CM
2 of 7 series · 14 of 36 positions shown, 17 images · non-contrast
Comparison: [DATE], PET [DATE] and CT chest [DATE].

CLINICAL DATA: Interstitial lung disease.

EXAM:
CT CHEST WITHOUT CONTRAST
TECHNIQUE: Multidetector CT imaging of the chest was performed following the
standard protocol without intravenous contrast. High resolution
imaging of the lungs, as well as inspiratory and expiratory imaging,
was performed.

[Series 5: high resolution · axial · 0.61mm/px · z∈[-396,-148]mm · 11 of 150 slices shown, 14 images]
[im 13/150  mediastinal]
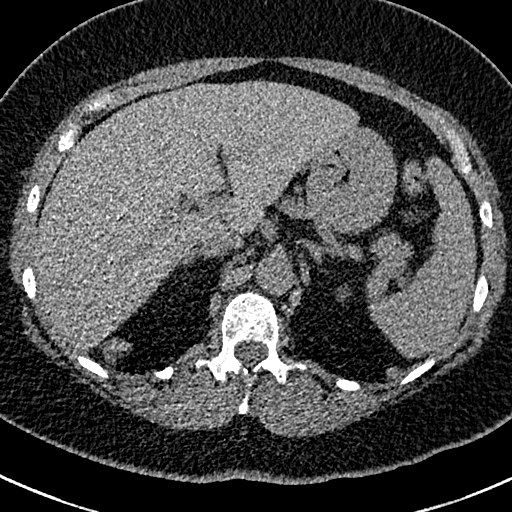
[im 13/150  lung]
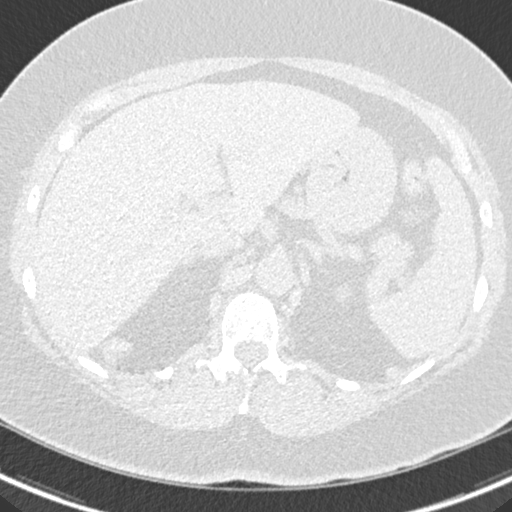
[im 25/150  lung]
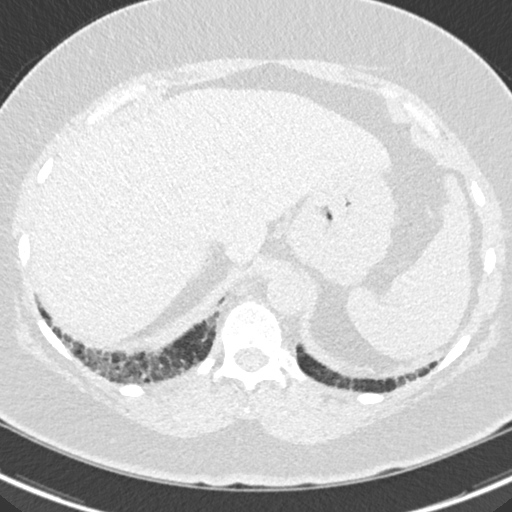
[im 38/150  lung]
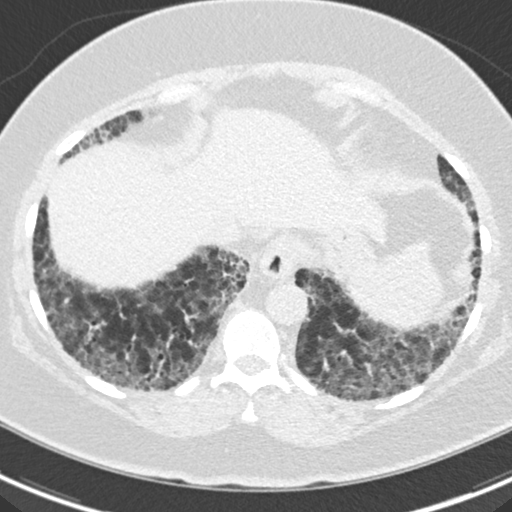
[im 50/150  lung]
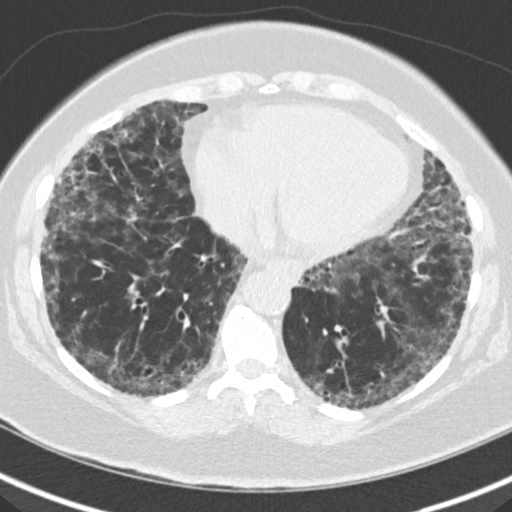
[im 63/150  mediastinal]
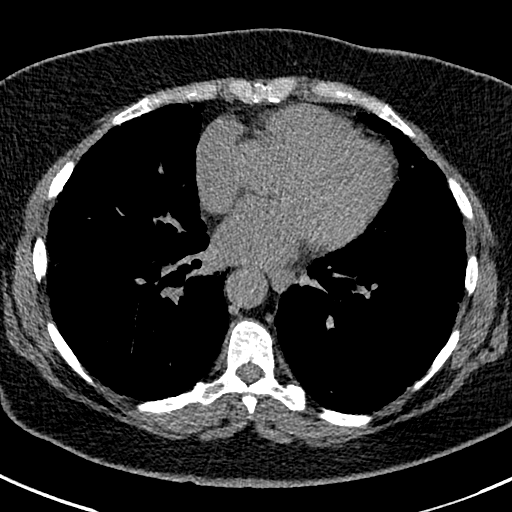
[im 63/150  lung]
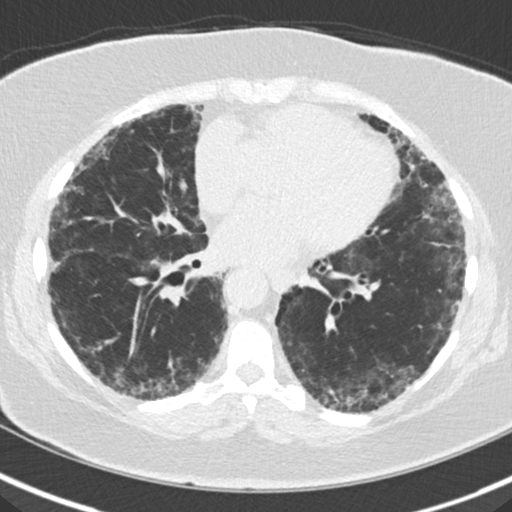
[im 75/150  lung]
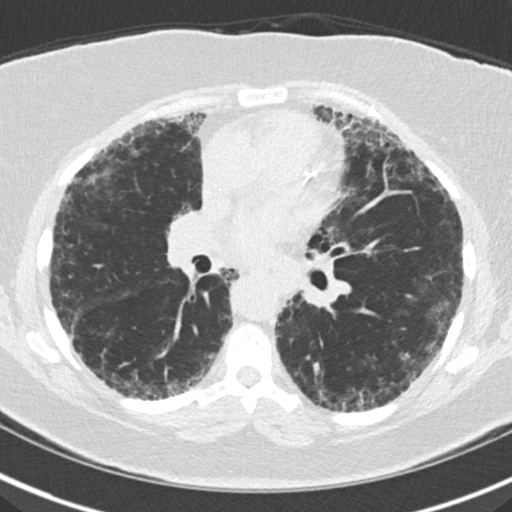
[im 87/150  lung]
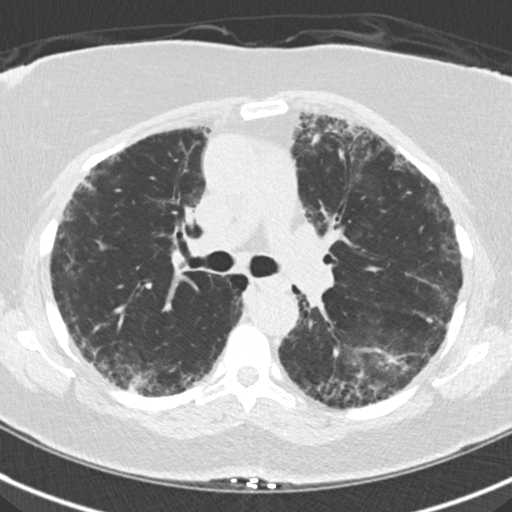
[im 100/150  lung]
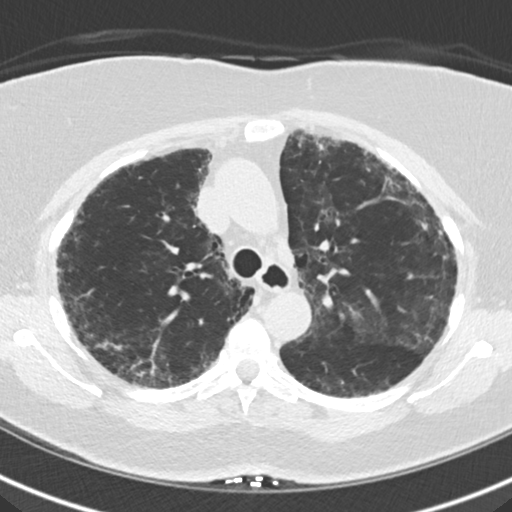
[im 112/150  mediastinal]
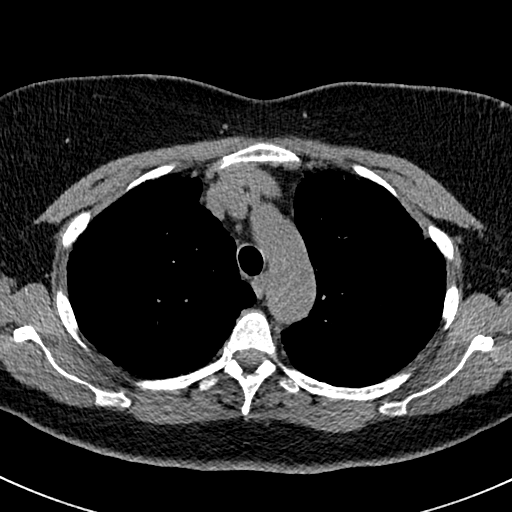
[im 112/150  lung]
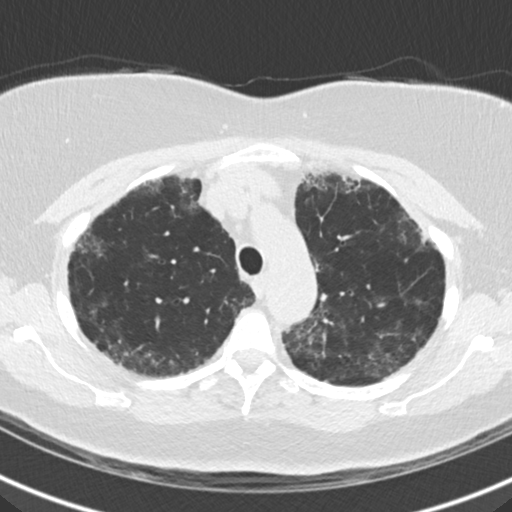
[im 125/150  lung]
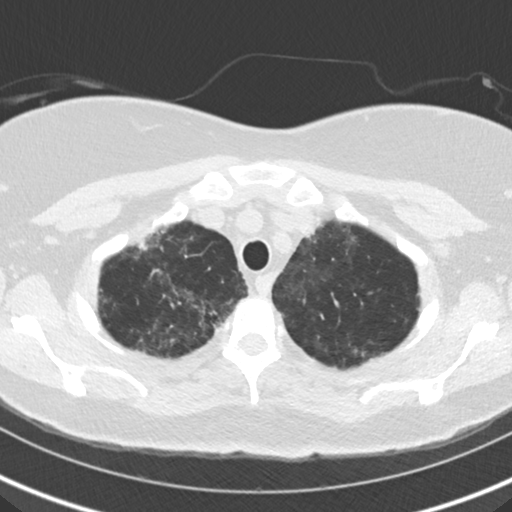
[im 137/150  lung]
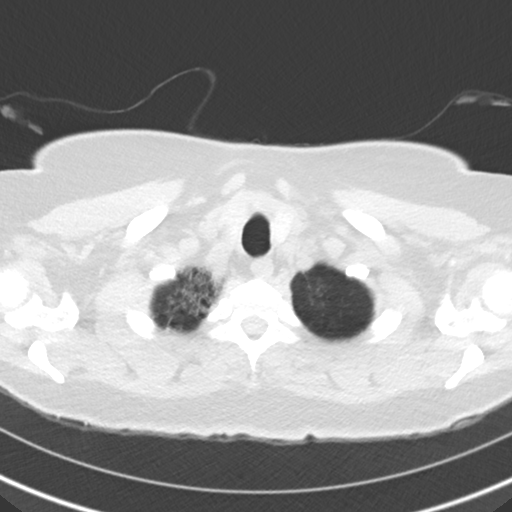

[Series 8: coronal · coronal · 0.59mm/px · 3 of 151 slices shown]
[im 31/151  lung]
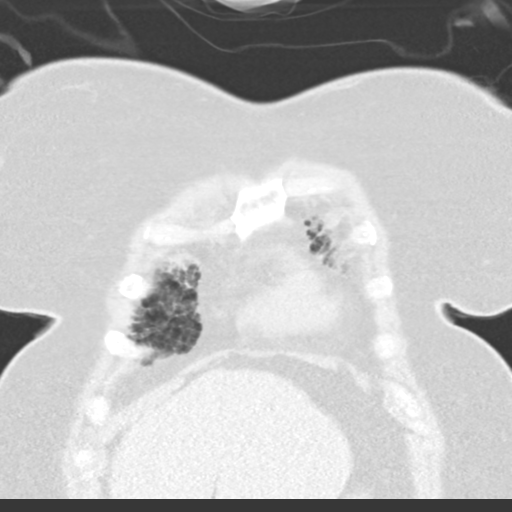
[im 61/151  lung]
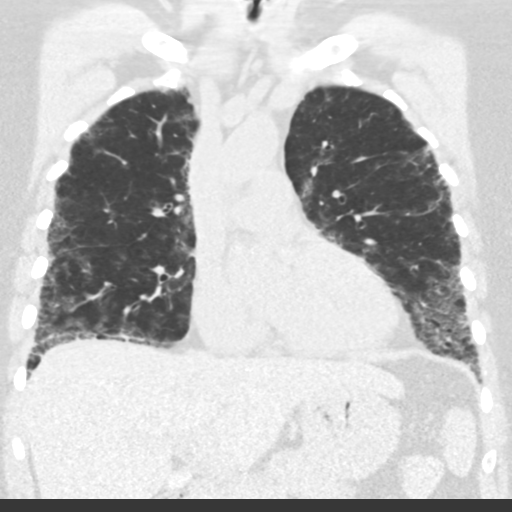
[im 91/151  lung]
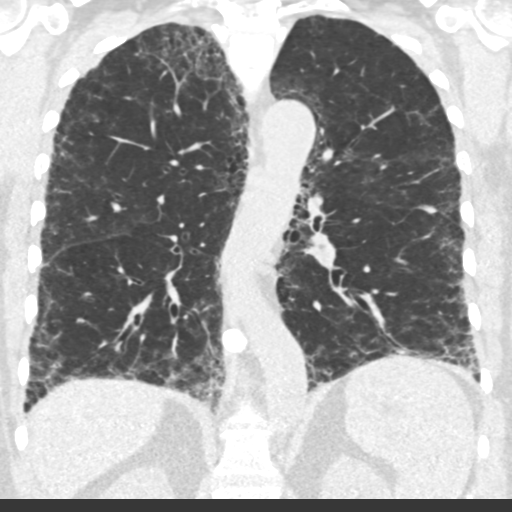

[14 of 36 positions shown; findings below may reference images not displayed]

FINDINGS: Cardiovascular: Coronary artery calcification. Heart is at the upper
limits of normal in size to mildly enlarged. No pericardial
effusion.

Mediastinum/Nodes: Mediastinal lymph nodes measure up to 1.2 cm in
the low right paratracheal station, as before. Hilar regions are
difficult to evaluate without IV contrast. No axillary adenopathy.
Mild esophageal dilatation. Esophagus is otherwise unremarkable.

Lungs/Pleura: Worsening peripheral and basilar predominant
subpleural reticulation, ground-glass, traction
bronchiectasis/bronchiolectasis and probable honeycombing. No air
trapping. Perifissural nodules measure up to 8 x 13 mm along the
left major fissure, minimally increased from [DATE] with still
felt to represent subpleural lymph nodes. No pleural fluid. Airway
is unremarkable.

Upper Abdomen: Visualized portion of the liver is unremarkable.
Stones layer in the gallbladder. Visualized portions of the adrenal
glands, kidneys, spleen, pancreas, stomach and bowel are grossly
unremarkable. There may be a small hiatal hernia. Upper abdominal
lymph nodes are not enlarged by CT size criteria.

Musculoskeletal: Degenerative changes in the spine.
IMPRESSION: 1. Progressive pulmonary parenchymal pattern of fibrosis, as
described above. Findings are consistent with UIP per consensus
guidelines: Diagnosis of Idiopathic Pulmonary Fibrosis: An Official
ATS/ERS/JRS/ALAT Clinical Practice Guideline. Am J Respir Crit Care
Med Vol 198, MIGDAEL 5, [T7]-e[DATE].
2. Cholelithiasis.
3. Coronary artery calcification.

## 2018-04-10 ENCOUNTER — Ambulatory Visit: Payer: Medicare HMO | Admitting: Internal Medicine

## 2018-04-13 ENCOUNTER — Ambulatory Visit: Payer: Medicare HMO | Admitting: Internal Medicine

## 2018-04-26 ENCOUNTER — Telehealth: Payer: Self-pay | Admitting: Internal Medicine

## 2018-04-27 NOTE — Telephone Encounter (Signed)
Pt had HRCT performed 04/09/18.  Attempted to call pt but unable to reach pt. Left message for pt to return call.

## 2018-04-30 NOTE — Telephone Encounter (Signed)
She had a follow-up apt on 10/29 to with Dr. Chase Caller which she no showed. CT showed progressive pulmonary fibrosis. Needs to follow up with Dr. Chase Caller in [facility] clinic please.

## 2018-04-30 NOTE — Telephone Encounter (Signed)
Called and spoke with pt who is requesting to know the results of her CT that was performed 04/09/18. Due to MR being out of the office and since Derl Barrow, NP sending this to [facility] to see if she can let us know the results of pt's scan.  Beth, please advise on this for pt. Thanks!

## 2018-04-30 NOTE — Telephone Encounter (Signed)
Called and spoke to patient, made aware of CT results. Appointment made for 05/15/2018 @ 11:30am. Patient voiced understanding. Nothing further is needed at this time.

## 2018-05-15 ENCOUNTER — Ambulatory Visit: Payer: Medicare HMO | Admitting: Internal Medicine

## 2018-06-01 ENCOUNTER — Ambulatory Visit: Payer: Medicare HMO | Admitting: Nurse Practitioner

## 2018-07-27 ENCOUNTER — Encounter: Payer: Self-pay | Admitting: Nurse Practitioner

## 2018-07-27 ENCOUNTER — Ambulatory Visit (INDEPENDENT_AMBULATORY_CARE_PROVIDER_SITE_OTHER): Payer: Medicare HMO | Admitting: Nurse Practitioner

## 2018-07-27 VITALS — BP 138/82 | HR 82 | Ht 63.0 in | Wt 181.4 lb

## 2018-07-27 DIAGNOSIS — J439 Emphysema, unspecified: Secondary | ICD-10-CM

## 2018-07-27 DIAGNOSIS — J849 Interstitial pulmonary disease, unspecified: Secondary | ICD-10-CM

## 2018-07-27 DIAGNOSIS — Z720 Tobacco use: Secondary | ICD-10-CM

## 2018-07-27 MED ORDER — TIOTROPIUM BROMIDE MONOHYDRATE 1.25 MCG/ACT IN AERS: 2.0000 | INHALATION_SPRAY | Freq: Every day | RESPIRATORY_TRACT | 0 refills | Status: DC

## 2018-07-27 MED ORDER — PREDNISONE 10 MG PO TABS: ORAL_TABLET | ORAL | 0 refills | Status: DC

## 2018-07-27 NOTE — Assessment & Plan Note (Signed)
Discussed with patient that she needs to follow-up with Dr. Chase Caller.  She has had multiple no-shows.  We discussed that her last CT scan was abnormal.  He has scheduled a follow-up appointment with Dr. Chase Caller in March.  States that she will keep this appointment.

## 2018-07-27 NOTE — Assessment & Plan Note (Signed)
Concern for COPD exacerbation.  Will treat with prednisone taper.  Will defer antibiotics at this time.  Will give samples of Spiriva so the patient can restart her inhaler.  Discussed that she needs to follow-up with Dr. Chase Caller as scheduled due to abnormal CT scan.  She states that she will keep upcoming appointment.  Patient Instructions  Will order prednisone taper Will restart Spiriva - 2 puffs daily - samples given Continue Proventil as needed Follow up with Dr. Chase Caller in March as scheduled

## 2018-07-27 NOTE — Patient Instructions (Signed)
Will order prednisone taper Will restart Spiriva - 2 puffs daily - samples given Continue Proventil as needed Follow up with Dr. Chase Caller in March as scheduled

## 2018-07-27 NOTE — Progress Notes (Signed)
_0  ID: Erica [name], female    DOB: 1956-01-23, 63 y.o.   MRN: [id number]  Chief Complaint  Patient presents with  . Follow-up    follow up    Referring provider: Nolene Ebbs, MD  HPI 63 year old female, former smoker (quit 12/2017). PMH mild COPD, interstitial lung disease, GERD, rectal cancer. Patient of Dr. Chase [name] HRCT in October showed IPF  - patient was instructed to follow up in ILD clinic with Dr. Chase [name], but has no showed 4 times with Dr. Chase [name] and NP.   Tests: HRCT 04/09/18 - Progressive pulmonary parenchymal pattern of fibrosis, as described above. Findings are consistent with UIP per consensus guidelines: Diagnosis of Idiopathic Pulmonary Fibrosis: An Official ATS/ERS/JRS/ALAT Clinical Practice Guideline.  CXR 09/06/17 - Interval progression of the pulmonary fibrotic process bilaterally. No acute pneumonia nor CHF.  PFT: PFT Results Latest Ref Rng & Units 10/24/2017 08/11/2016  FVC-Pre L 2.79 2.56  FVC-Predicted Pre % 108 98  FVC-Post L - 2.99  FVC-Predicted Post % - 114  Pre FEV1/FVC % % 69 67  Post FEV1/FCV % % - 67  FEV1-Pre L 1.93 1.72  FEV1-Predicted Pre % 95 84  FEV1-Post L - 2.01  DLCO UNC% % - 61  DLCO COR %Predicted % - 66  TLC L - 5.31  TLC % Predicted % - 105  RV % Predicted % - 136   10/24/17 - OV Erica [name] presents to the interstitial lung disease clinic for evaluation of potential interstitial lung disease.  She is followed in the general pulmonary clinic as stated above.  Her other issues ongoing smoking.  She is somewhat of a poor historian and as soon as I walked in she reported a new complaint of acute sinus congestion for the last 2 weeks after having picked up a cold from her sister.  Also the ongoing spring pollen season is making things worse.  She is coughing up some green sputum.  She feels this needs to be treated by me and she wants an antibiotic for this.  There is no associated wheezing or cough or shortness of  breath because of this.  We discussed her underlying lung disease and she tells me that she is aware that she has COPD/emphysema.  But she is totally unaware about pulmonary fibrosis or interstitial lung disease.  She tells me that overall compared to last year she is stable and she only has mild dyspnea on exertion without much of a cough.  There is no chest pain no orthopnea proximal nocturnal dyspnea wheezing or hemoptysis or weight loss.  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  Only takes albuerol as needed.  Her last CT scan of the chest high resolution was in January 2018 that thoracic radiology Dr. Lorin [name] described as indicative of UIP.  I personally visualized the CT and I personally think this is indeterminate for UIP.  There is bilateral bibasal craniocaudal gradients reticular abnormalities but I am not so sure that these are subpleural and more of I do not see clear-cut traction bronchiectasis or honeycombing although I will agree that there is no groundglass.  OV 07/27/18 - Follow up Patient presents today for a follow-up visit.  She was last seen by Erica [name] on 03/08/2018.  She had a follow-up HRCT in October 2019 and was advised to follow-up with Dr. Chase [name] after scan.  That scan showed progressive pulmonary pattern of fibrosis consistent with  UIP with diagnosis of idiopathic pulmonary fibrosis.  Patient has had multiple no-shows since that visit.  She states today that over the last 3 to 4 weeks she has had increased shortness of breath. States that she ran out of her Spiriva.  She has not used her Spiriva in the last 2 to 3 months.  She states that she gets tired when walking to her car or down the aisle in the grocery store.  She gets short of breath going up stairs.  She denies any recent fever or sinus congestion.  She denies any chest pain or edema.  She states that since her last visit she did start back smoking.  She states that  she has quit smoking again.  She has not smoked in the past month.   Allergies  Allergen Reactions  . Penicillins Hives, Itching and Swelling    Tongue swells up Has patient had a PCN reaction causing immediate rash, facial/tongue/throat swelling, SOB or lightheadedness with hypotension: Yes Has patient had a PCN reaction causing severe rash involving mucus membranes or skin necrosis: Yes Has patient had a PCN reaction that required hospitalization Yes Has patient had a PCN reaction occurring within the last 10 years: No If all of the above answers are "NO", then may proceed with Cephalosporin use.     Immunization History  Administered Date(s) Administered  . Influenza Split 03/13/2017  . Influenza Whole 04/26/2018  . Influenza-Unspecified 03/13/2016    Past Medical History:  Diagnosis Date  . Arthritis    especially in shoulders  . Asthma   . Depression   . Diabetes mellitus   . GERD (gastroesophageal reflux disease)   . Headache(784.0)    "mild"  . Hypertension   . Mental disorder    depression  . Neuropathy    feet   . Pain    arthritis pain - takes tramadol as needed  . Rectal polyp    very little bleeding with bowel movements- no pain    Tobacco History: Social History   Tobacco Use  Smoking Status Former Smoker  . Packs/day: 0.25  . Years: 20.00  . Pack years: 5.00  . Types: Cigarettes  . Last attempt to quit: 01/05/2018  . Years since quitting: 0.5  Smokeless Tobacco Never Used  Tobacco Comment   started smoking again July 2018, 2 cigs daily   Counseling given: Yes Comment: started smoking again July 2018, 2 cigs daily   Outpatient Encounter Medications as of 07/27/2018  Medication Sig  . albuterol (PROVENTIL HFA;VENTOLIN HFA) 108 (90 BASE) MCG/ACT inhaler Inhale 2 puffs into the lungs every 4 (four) hours as needed for wheezing.  Marland Kitchen albuterol (PROVENTIL) (2.5 MG/3ML) 0.083% nebulizer solution Take 3 mLs (2.5 mg total) by nebulization every 6  (six) hours as needed for wheezing or shortness of breath.  Marland Kitchen amLODipine (NORVASC) 10 MG tablet TK 1 T PO QD  . fluticasone (FLONASE) 50 MCG/ACT nasal spray Place 2 sprays into both nostrils daily.  Marland Kitchen gabapentin (NEURONTIN) 300 MG capsule Take 300 mg by mouth 3 (three) times daily.   Marland Kitchen losartan-hydrochlorothiazide (HYZAAR) 50-12.5 MG tablet Take 1 tablet by mouth daily.  . montelukast (SINGULAIR) 10 MG tablet Take 1 tablet (10 mg total) by mouth at bedtime.  . Multiple Vitamin (MULTIVITAMIN WITH MINERALS) TABS tablet Take 1 tablet by mouth daily.  . NON FORMULARY Take 1 capsule by mouth 4 (four) times daily. FDgard for reflux.  . nortriptyline (PAMELOR) 25 MG capsule TK 1 C  PO QHS PRF SLP  . pantoprazole (PROTONIX) 40 MG tablet Take 40 mg by mouth daily.  . simvastatin (ZOCOR) 10 MG tablet Take 53ms at bedtime  . sodium chloride (OCEAN) 0.65 % SOLN nasal spray Place 1 spray into both nostrils as needed for congestion.  . Tiotropium Bromide Monohydrate (SPIRIVA RESPIMAT) 1.25 MCG/ACT AERS Inhale 2 puffs into the lungs daily.  . Tiotropium Bromide Monohydrate (SPIRIVA RESPIMAT) 1.25 MCG/ACT AERS Inhale 2 puffs into the lungs daily.  .Marland KitchentiZANidine (ZANAFLEX) 4 MG tablet Take 4 mg by mouth 3 (three) times daily as needed for spasms.  .Nelva NaySOLOSTAR 300 UNIT/ML SOPN Inject 100 Units into the skin at bedtime.   . predniSONE (DELTASONE) 10 MG tablet Take 4 tabs for 2 days, then 3 tabs for 2 days, then 2 tabs for 2 days, then 1 tab for 2 days, then stop  . Tiotropium Bromide Monohydrate (SPIRIVA RESPIMAT) 1.25 MCG/ACT AERS Inhale 2 puffs into the lungs daily.  . [DISCONTINUED] predniSONE (DELTASONE) 10 MG tablet Take 4 tabs po daily x 3 days; then 3 tabs daily x3 days; then 2 tabs daily x3 days; then 1 tab daily x 3 days; then stop (Patient not taking: Reported on 07/27/2018)   No facility-administered encounter medications on file as of 07/27/2018.      Review of Systems  Review of Systems    Constitutional: Negative.  Negative for chills and fever.  HENT: Negative.   Respiratory: Positive for shortness of breath. Negative for cough and wheezing.   Cardiovascular: Negative.  Negative for chest pain, palpitations and leg swelling.  Gastrointestinal: Negative.   Allergic/Immunologic: Negative.   Neurological: Negative.   Psychiatric/Behavioral: Negative.        Physical Exam  BP 138/82   Pulse 82   Ht _0  (1.6 m)   Wt 181 lb 6.4 oz (82.3 kg)   SpO2 95%   BMI 32.13 kg/m   Wt Readings from Last 5 Encounters:  07/27/18 181 lb 6.4 oz (82.3 kg)  03/08/18 173 lb (78.5 kg)  09/06/17 170 lb (77.1 kg)  08/30/17 170 lb 14.4 oz (77.5 kg)  03/02/17 171 lb 11.2 oz (77.9 kg)     Physical Exam Vitals signs and nursing note reviewed.  Constitutional:      General: She is not in acute distress.    Appearance: She is well-developed.  Cardiovascular:     Rate and Rhythm: Normal rate and regular rhythm.  Pulmonary:     Effort: Pulmonary effort is normal. No respiratory distress.     Breath sounds: Normal breath sounds. No wheezing or rhonchi.  Musculoskeletal:        General: No swelling.  Neurological:     Mental Status: She is alert and oriented to person, place, and time.       Assessment & Plan:   COPD (chronic obstructive pulmonary disease) ([facility] [place] Concern for COPD exacerbation.  Will treat with prednisone taper.  Will defer antibiotics at this time.  Will give samples of Spiriva so the patient can restart her inhaler.  Discussed that she needs to follow-up with Dr. RChase Calleras scheduled due to abnormal CT scan.  She states that she will keep upcoming appointment.  Patient Instructions  Will order prednisone taper Will restart Spiriva - 2 puffs daily - samples given Continue Proventil as needed Follow up with Dr. RChase Callerin March as scheduled    Interstitial lung disease ([facility] Discussed with patient that she needs to follow-up with Dr. RChase [name]  She  has had multiple no-shows.  We discussed that her last CT scan was abnormal.  He has scheduled a follow-up appointment with Dr. Chase [name] in March.  States that she will keep this appointment.  Tobacco user Patient states that she has quit smoking. Smoking cessation instruction/counseling given:  commended patient for quitting and reviewed strategies for preventing relapses      Fenton Foy, NP 07/27/2018

## 2018-07-27 NOTE — Assessment & Plan Note (Signed)
Patient states that she has quit smoking. Smoking cessation instruction/counseling given:  commended patient for quitting and reviewed strategies for preventing relapses

## 2018-08-27 NOTE — Progress Notes (Signed)
[place]   Telephone:([phone]) [phone] [phone]   Clinic Follow up Note   Patient Care Team: Nolene Ebbs, MD as PCP - General (Internal Medicine) Truitt Merle, MD as Consulting Physician (Hematology) Leighton Ruff, MD as Consulting Physician (General Surgery) Arta Silence, MD as Consulting Physician (Gastroenterology)  Date of Service:  08/29/2018  CHIEF COMPLAINT: F/u for History of rectal cancer  SUMMARY OF ONCOLOGIC HISTORY: Oncology History   Rectal cancer   Staging form: Colon and Rectum, AJCC 7th Edition     Pathologic: Stage I (T1, N0, cM0) - Signed by Truitt Merle, MD on 09/26/2014       Rectal cancer ([place])   09/13/2012 Procedure    Colonoscopy & endorectal ultrasound-Dr. Outlaw=Distal colonic polyp    11/14/2012 Procedure    Sigmoidoscopy & Laparoscopy=Dr. Donne Hazel    01/29/2013 Pathologic Stage    pT1N0cM0, stage I, well-differentiated adenocarcinoma arising in background of tubular villous adenoma, invading into the submucosa. No evidence of angiolymphatic invasion. 18 lymph nodes were negative.    01/29/2013 Surgery    Rectosigmoid segmental resection by Dr. Marcello Moores. Surgical margin was negative.    02/04/2013 Initial Diagnosis    Rectal cancer    03/11/2014 Imaging    CT C/A/P=No acute abdominal/pelvic findings, mass lesions or adenopathy. No findings to suggest recurrent rectal cancer and no adenopathy or metastatic disease. 2. Emphysematous changes. 3. Suspect early interstitial lung disease. 4. Tortuous and mil    09/12/2014 Tumor Marker    CEA=6.4    10/14/2014 Imaging    PET showed new and enlarging vague pulmonary nodules bilaterally worrisome for metastatic disease, no other evidence of cancer recurrence     12/09/2014 Tumor Marker    7.5    01/12/2015 Imaging    CT chest showed no significant change in small bilateral subcentimeter solid pulmonary nodules, which are likely benign. Chronic interstitial lung disease.    01/12/2015 Tumor  Marker    CEA 8.1    07/13/2016 - 07/13/2016 Hospital Admission    Admit date: 07/13/2016  Patient presented to ED with complain of fecal incontinence    08/17/2016 Imaging     CT A/P WO CONTRAST IMPRESSION: 1. No evidence of local tumor recurrence at the colorectal anastomosis. 2. No evidence of metastatic disease in the abdomen or pelvis. 3. No appreciable interval change in interstitial lung disease at the lung bases. 4. Additional findings include aortic atherosclerosis, cholelithiasis and mild colonic diverticulosis.      CURRENT THERAPY:  Surveillance  INTERVAL HISTORY:  Erica [name] is here for a follow up. She was last seen by me 1 year ago. She presents to the clinic today with her friend. She notes she is doing well. She denies pain or abdominal issues. She has been seeing her pulmonologist for COPD and lung fibrosis. She is not currently on Oxygen. She cannot walk more than 14-20 steps without SOB. She has quit smoking but will have a cigarette occasionally.  She had a coloscopy in the last 6 months with Dr. Paulita Fujita and to repeat in 5 years. She notes she is due for mammogram. Her last was in 2016.     REVIEW OF SYSTEMS:   Constitutional: Denies fevers, chills or abnormal weight loss Eyes: Denies blurriness of vision Ears, nose, mouth, throat, and face: Denies mucositis or sore throat Respiratory: (+) SOB (+) Cough  Cardiovascular: Denies palpitation, chest discomfort or lower extremity swelling Gastrointestinal:  Denies nausea, heartburn or change in bowel habits Skin: Denies  abnormal skin rashes Lymphatics: Denies new lymphadenopathy or easy bruising Neurological:Denies numbness, tingling or new weaknesses Behavioral/Psych: Mood is stable, no new changes  All other systems were reviewed with the patient and are negative.  MEDICAL HISTORY:  Past Medical History:  Diagnosis Date  . Arthritis    especially in shoulders  . Asthma   . Depression   . Diabetes  mellitus   . GERD (gastroesophageal reflux disease)   . Headache(784.0)    "mild"  . Hypertension   . Mental disorder    depression  . Neuropathy    feet   . Pain    arthritis pain - takes tramadol as needed  . Rectal polyp    very little bleeding with bowel movements- no pain    SURGICAL HISTORY: Past Surgical History:  Procedure Laterality Date  . ANAL RECTAL MANOMETRY N/A 07/13/2016   Procedure: ANO RECTAL MANOMETRY;  Surgeon: Leighton Ruff, MD;  Location: [facility];  Service: Endoscopy;  Laterality: N/A;  . CESAREAN SECTION    . EUS N/A 11/21/2012   Procedure: LOWER ENDOSCOPIC ULTRASOUND (EUS);  Surgeon: Arta Silence, MD;  Location: [facility];  Service: Endoscopy;  Laterality: N/A;  . FLEXIBLE SIGMOIDOSCOPY N/A 11/21/2012   Procedure: FLEXIBLE SIGMOIDOSCOPY;  Surgeon: Arta Silence, MD;  Location: [facility];  Service: Endoscopy;  Laterality: N/A;  . FLEXIBLE SIGMOIDOSCOPY N/A 01/28/2013   Procedure: FLEXIBLE SIGMOIDOSCOPY;  Surgeon: Leighton Ruff, MD;  Location: [facility];  Service: Endoscopy;  Laterality: N/A;  . LAPAROSCOPIC LOW ANTERIOR RESECTION N/A 01/29/2013   Procedure: LAPAROSCOPIC LOW ANTERIOR RESECTION, Rigid Proctoscopy;  Surgeon: Leighton Ruff, MD;  Location: [facility]S;  Service: General;  Laterality: N/A;  . LAPAROSCOPIC SIGMOID COLECTOMY N/A 11/14/2012   Procedure: DIAGNOSTIC LAPAROSCOPY AND SIGMOIDMOIDOSCOPY ;  Surgeon: Rolm Bookbinder, MD;  Location: [facility];  Service: General;  Laterality: N/A;  . RECTAL ULTRASOUND N/A 07/13/2016   Procedure: RECTAL ULTRASOUND;  Surgeon: Leighton Ruff, MD;  Location: [facility];  Service: Endoscopy;  Laterality: N/A;  . TONSILLECTOMY    . TONSILLECTOMY AND ADENOIDECTOMY      I have reviewed the social history and family history with the patient and they are unchanged from previous note.  ALLERGIES:  is allergic to penicillins.  MEDICATIONS:  Current Outpatient Medications  Medication Sig Dispense Refill  .  albuterol (PROVENTIL HFA;VENTOLIN HFA) 108 (90 BASE) MCG/ACT inhaler Inhale 2 puffs into the lungs every 4 (four) hours as needed for wheezing.    Marland Kitchen albuterol (PROVENTIL) (2.5 MG/3ML) 0.083% nebulizer solution Take 3 mLs (2.5 mg total) by nebulization every 6 (six) hours as needed for wheezing or shortness of breath. 75 mL 12  . amLODipine (NORVASC) 10 MG tablet TK 1 T PO QD  2  . fluticasone (FLONASE) 50 MCG/ACT nasal spray Place 2 sprays into both nostrils daily. 16 g 2  . gabapentin (NEURONTIN) 300 MG capsule Take 300 mg by mouth 3 (three) times daily.     Marland Kitchen losartan-hydrochlorothiazide (HYZAAR) 50-12.5 MG tablet Take 1 tablet by mouth daily.    . montelukast (SINGULAIR) 10 MG tablet Take 1 tablet (10 mg total) by mouth at bedtime. 30 tablet 5  . Multiple Vitamin (MULTIVITAMIN WITH MINERALS) TABS tablet Take 1 tablet by mouth daily.    . NON FORMULARY Take 1 capsule by mouth 4 (four) times daily. FDgard for reflux.    . nortriptyline (PAMELOR) 25 MG capsule TK 1 C PO QHS PRF SLP  5  . pantoprazole (PROTONIX) 40 MG tablet Take  40 mg by mouth daily.    . simvastatin (ZOCOR) 10 MG tablet Take 28ms at bedtime  5  . sodium chloride (OCEAN) 0.65 % SOLN nasal spray Place 1 spray into both nostrils as needed for congestion. 88 mL 5  . Tiotropium Bromide Monohydrate (SPIRIVA RESPIMAT) 1.25 MCG/ACT AERS Inhale 2 puffs into the lungs daily. 1 Inhaler 5  . Tiotropium Bromide Monohydrate (SPIRIVA RESPIMAT) 1.25 MCG/ACT AERS Inhale 2 puffs into the lungs daily. 4 g 0  . Tiotropium Bromide Monohydrate (SPIRIVA RESPIMAT) 1.25 MCG/ACT AERS Inhale 2 puffs into the lungs daily. 2 Inhaler 0  . tiZANidine (ZANAFLEX) 4 MG tablet Take 4 mg by mouth 3 (three) times daily as needed for spasms.  5  . TOUJEO SOLOSTAR 300 UNIT/ML SOPN Inject 100 Units into the skin at bedtime.   1   No current facility-administered medications for this visit.     PHYSICAL EXAMINATION: ECOG PERFORMANCE STATUS: 2 - Symptomatic, <50%  confined to bed  Vitals:   08/29/18 0944 08/29/18 0946  BP: (!) 157/92 (!) 170/88  Pulse: 67   Resp: 17   Temp: 97.6 F (36.4 C)   SpO2: 98%    Filed Weights   08/29/18 0944  Weight: 185 lb 4.8 oz (84.1 kg)    GENERAL:alert, no distress and comfortable SKIN: skin color, texture, turgor are normal, no rashes or significant lesions EYES: normal, Conjunctiva are pink and non-injected, sclera clear OROPHARYNX:no exudate, no erythema and lips, buccal mucosa, and tongue normal  NECK: supple, thyroid normal size, non-tender, without nodularity LYMPH:  no palpable lymphadenopathy in the cervical, axillary or inguinal LUNGS: clear to auscultation and percussion with normal breathing effort (+) B/l crackles at base of lungs related to fibrosis HEART: regular rate & rhythm and no murmurs and no lower extremity edema ABDOMEN:abdomen soft, non-tender and normal bowel sounds Musculoskeletal:no cyanosis of digits and no clubbing  NEURO: alert & oriented x 3 with fluent speech, no focal motor/sensory deficits  LABORATORY DATA:  I have reviewed the data as listed CBC Latest Ref Rng & Units 08/29/2018 08/30/2017 03/02/2017  WBC 4.0 - 10.5 K/uL 4.2 4.5 4.4  Hemoglobin 12.0 - 15.0 g/dL 11.0(L) 11.0(L) 12.5  Hematocrit 36.0 - 46.0 % 32.0(L) 31.0(L) 35.4  Platelets 150 - 400 K/uL 171 166 192     CMP Latest Ref Rng & Units 08/29/2018 08/30/2017 03/02/2017  Glucose 70 - 99 mg/dL 189(H) 269(H) 482(H)  BUN 8 - 23 mg/dL 19 22 20.0  Creatinine 0.44 - 1.00 mg/dL 1.38(H) 1.36(H) 1.7(H)  Sodium 135 - 145 mmol/L 139 136 131(L)  Potassium 3.5 - 5.1 mmol/L 4.0 4.4 4.8  Chloride 98 - 111 mmol/L 109 105 -  CO2 22 - 32 mmol/L 21(L) 24 24  Calcium 8.9 - 10.3 mg/dL 8.2(L) 8.8 9.2  Total Protein 6.5 - 8.1 g/dL 8.0 8.1 8.0  Total Bilirubin 0.3 - 1.2 mg/dL 1.2 1.1 0.99  Alkaline Phos 38 - 126 U/L 124 148 139  AST 15 - 41 U/L _0 ALT 0 - 44 U/L _1 RADIOGRAPHIC STUDIES: I have personally  reviewed the radiological images as listed and agreed with the findings in the report. No results found.   ASSESSMENT & PLAN:  Erica TAKACHis a 63y.o. female with   1. History of rectal adenocarcinoma, stage I, arising CEA, indeterminate lung nodules -She was diagnosed in 01/2013. She is s/p surgical resection.  -She has had stable  b/l small lung nodules on scans, latest 04/09/18. Most likely benign changes.  -Latest colonoscopy was in the last 6 months with Dr. Paulita Fujita. She plans to repeat in 5 years.  -From a colon cancer standpoint she is clinically doing well. Labs reviewed, CBC WNL except mild anemia with hg of 11, CMP within normal limits except hyperglycemia and mildly elevated creatinine. CEA is still pending. Physical exam stable. There is clinically no concern for recurrence.  -She is more than 5 years since diagnosis. Her risk for recurrence is now minimal. I discussed discharging her from my care as she can follow up with her PCP and GI. She is agreeable.  -I discussed the signs and symptoms of cancer recurrence to watch for. She can see me as needed in the future.   2. DM and hyperglycemia -Not completely controlled. Continue to follow up with primary care physician  3. CKD -likely related to her uncontrolled hyperglycemia -will avoid nephrotoxins such as NSAIDs and IV contrast  4. HTN  -Continue medication, and a follow-up with primary care physician -BP elevated today at 170/88 (08/29/18)  5. Smoking cessation  -Mildly Elevated CEA could be related to smoking -Patient reports she quit smoking a month ago. Smokes only occasionally  -I advised her to continue cessation.   6. Interstitial Lung Disease -Evidence of fibrosis seen on scans  -She now has a cough and continues to have SOB due to COPD. She is not on oxygen.  -On exam today she had B/l crackles at base of lungs related to fibrosis -She will continue to follow up with her pulmonologist.  -She notes she quit  smoking but will have a cigarette occasionally. I encouraged her to continue cessation.   7. Cancer screening  -I strongly encouraged her to remain up to date on all her appropriate cancer screening. Last mammogram was in 2016. She agreed to proceed with Mammogram this month and I encouraged her to continue yearly.  -She will follow-up with her PCP for Pap smear   Plan Mamm[facility] at [facility] in a month F/u open and as needed     No problem-specific Assessment & Plan notes found for this encounter.   Orders Placed This Encounter  Procedures  . MM Digital Screening    Standing Status:   Future    Standing Expiration Date:   08/29/2019    Order Specific Question:   Reason for Exam (SYMPTOM  OR DIAGNOSIS REQUIRED)    Answer:   screening    Order Specific Question:   Preferred imaging location?    Answer:   [facility]   All questions were answered. The patient knows to call the clinic with any problems, questions or concerns. No barriers to learning was detected. I spent 15 minutes counseling the patient face to face. The total time spent in the appointment was 20 minutes and more than 50% was on counseling and review of test results     Truitt Merle, MD 08/29/2018   I, Joslyn Devon, am acting as scribe for Truitt Merle, MD.   I have reviewed the above documentation for accuracy and completeness, and I agree with the above.

## 2018-08-29 ENCOUNTER — Inpatient Hospital Stay (HOSPITAL_BASED_OUTPATIENT_CLINIC_OR_DEPARTMENT_OTHER): Payer: Medicare HMO | Admitting: Hematology

## 2018-08-29 ENCOUNTER — Inpatient Hospital Stay: Payer: Medicare HMO | Attending: Hematology

## 2018-08-29 ENCOUNTER — Telehealth: Payer: Self-pay | Admitting: Hematology

## 2018-08-29 ENCOUNTER — Other Ambulatory Visit: Payer: Self-pay

## 2018-08-29 ENCOUNTER — Encounter: Payer: Self-pay | Admitting: Hematology

## 2018-08-29 VITALS — BP 170/88 | HR 67 | Temp 97.6°F | Resp 17 | Ht 63.0 in | Wt 185.3 lb

## 2018-08-29 DIAGNOSIS — J449 Chronic obstructive pulmonary disease, unspecified: Secondary | ICD-10-CM | POA: Diagnosis not present

## 2018-08-29 DIAGNOSIS — E1122 Type 2 diabetes mellitus with diabetic chronic kidney disease: Secondary | ICD-10-CM

## 2018-08-29 DIAGNOSIS — E1165 Type 2 diabetes mellitus with hyperglycemia: Secondary | ICD-10-CM

## 2018-08-29 DIAGNOSIS — J8410 Pulmonary fibrosis, unspecified: Secondary | ICD-10-CM

## 2018-08-29 DIAGNOSIS — Z720 Tobacco use: Secondary | ICD-10-CM

## 2018-08-29 DIAGNOSIS — Z1231 Encounter for screening mammogram for malignant neoplasm of breast: Secondary | ICD-10-CM

## 2018-08-29 DIAGNOSIS — I129 Hypertensive chronic kidney disease with stage 1 through stage 4 chronic kidney disease, or unspecified chronic kidney disease: Secondary | ICD-10-CM | POA: Diagnosis not present

## 2018-08-29 DIAGNOSIS — C20 Malignant neoplasm of rectum: Secondary | ICD-10-CM

## 2018-08-29 DIAGNOSIS — Z85048 Personal history of other malignant neoplasm of rectum, rectosigmoid junction, and anus: Secondary | ICD-10-CM

## 2018-08-29 DIAGNOSIS — N189 Chronic kidney disease, unspecified: Secondary | ICD-10-CM

## 2018-08-29 LAB — CBC WITH DIFFERENTIAL (CANCER CENTER ONLY)
Abs Immature Granulocytes: 0.02 10*3/uL (ref 0.00–0.07)
Basophils Absolute: 0 10*3/uL (ref 0.0–0.1)
Basophils Relative: 0 %
Eosinophils Absolute: 0.1 10*3/uL (ref 0.0–0.5)
Eosinophils Relative: 1 %
HCT: 32 % — ABNORMAL LOW (ref 36.0–46.0)
Hemoglobin: 11 g/dL — ABNORMAL LOW (ref 12.0–15.0)
Immature Granulocytes: 1 %
Lymphocytes Relative: 22 %
Lymphs Abs: 0.9 10*3/uL (ref 0.7–4.0)
MCH: 31.6 pg (ref 26.0–34.0)
MCHC: 34.4 g/dL (ref 30.0–36.0)
MCV: 92 fL (ref 80.0–100.0)
Monocytes Absolute: 0.3 10*3/uL (ref 0.1–1.0)
Monocytes Relative: 7 %
Neutro Abs: 2.9 10*3/uL (ref 1.7–7.7)
Neutrophils Relative %: 69 %
Platelet Count: 171 10*3/uL (ref 150–400)
RBC: 3.48 MIL/uL — ABNORMAL LOW (ref 3.87–5.11)
RDW: 16.7 % — ABNORMAL HIGH (ref 11.5–15.5)
WBC Count: 4.2 10*3/uL (ref 4.0–10.5)
nRBC: 0 % (ref 0.0–0.2)

## 2018-08-29 LAB — CMP (CANCER CENTER ONLY)
ALT: 11 U/L (ref 0–44)
AST: 16 U/L (ref 15–41)
Albumin: 2.9 g/dL — ABNORMAL LOW (ref 3.5–5.0)
Alkaline Phosphatase: 124 U/L (ref 38–126)
Anion gap: 9 (ref 5–15)
BUN: 19 mg/dL (ref 8–23)
CO2: 21 mmol/L — ABNORMAL LOW (ref 22–32)
Calcium: 8.2 mg/dL — ABNORMAL LOW (ref 8.9–10.3)
Chloride: 109 mmol/L (ref 98–111)
Creatinine: 1.38 mg/dL — ABNORMAL HIGH (ref 0.44–1.00)
GFR, Est AFR Am: 47 mL/min — ABNORMAL LOW (ref >60)
GFR, Estimated: 40 mL/min — ABNORMAL LOW (ref >60)
Glucose, Bld: 189 mg/dL — ABNORMAL HIGH (ref 70–99)
Potassium: 4 mmol/L (ref 3.5–5.1)
Sodium: 139 mmol/L (ref 135–145)
Total Bilirubin: 1.2 mg/dL (ref 0.3–1.2)
Total Protein: 8 g/dL (ref 6.5–8.1)

## 2018-08-29 LAB — CEA (IN HOUSE-CHCC): CEA (CHCC-In House): 25.61 ng/mL — ABNORMAL HIGH (ref 0.00–5.00)

## 2018-08-29 NOTE — Telephone Encounter (Signed)
Per los, f/u open

## 2018-08-30 ENCOUNTER — Ambulatory Visit: Payer: Medicare HMO | Admitting: Internal Medicine

## 2018-08-30 ENCOUNTER — Encounter: Payer: Self-pay | Admitting: Nurse Practitioner

## 2018-08-30 ENCOUNTER — Ambulatory Visit (INDEPENDENT_AMBULATORY_CARE_PROVIDER_SITE_OTHER): Payer: Medicare HMO | Admitting: Nurse Practitioner

## 2018-08-30 DIAGNOSIS — J849 Interstitial pulmonary disease, unspecified: Secondary | ICD-10-CM

## 2018-08-30 NOTE — Assessment & Plan Note (Signed)
Patient has been stable since last visit.  He is now taking Symbicort and Spiriva.  She states that this is working well for her.  Patient Instructions  Continue Spiriva Continue Symbicort - started by PCP Continue Singulair Continue Flonase  Follow up: Follow up with Dr. Chase Caller , only, not NP,  at his first available appointment in around 2 months or sooner if needed

## 2018-08-30 NOTE — Progress Notes (Signed)
_0  ID: Erica [name], female    DOB: 1956/05/30, 63 y.o.   MRN: [id number]  Chief Complaint  Patient presents with  . Follow-up    Interstitial lung disease, emphysema    Referring provider: Nolene Ebbs, MD  HPI 63 year old female,former smoker (quit 12/2017). PMHmildCOPD, interstitial lung disease, GERD, rectal cancer. Patient of Dr. Chase Caller HRCT in October showed IPF  - patient was instructed to follow up in ILD clinic with Dr. Chase Caller, but has no showed 4 times with Dr. Chase Caller and NP.   Tests: HRCT 04/09/18 - Progressive pulmonary parenchymal pattern of fibrosis, as described above. Findings are consistent with UIP per consensus guidelines: Diagnosis of Idiopathic Pulmonary Fibrosis: An Official ATS/ERS/JRS/ALAT Clinical Practice Guideline.  CXR 09/06/17 - Interval progression of the pulmonary fibrotic process bilaterally. No acute pneumonia nor CHF.  PFT Results Latest Ref Rng & Units 10/24/2017 08/11/2016  FVC-Pre L 2.79 2.56  FVC-Predicted Pre % 108 98  FVC-Post L - 2.99  FVC-Predicted Post % - 114  Pre FEV1/FVC % % 69 67  Post FEV1/FCV % % - 67  FEV1-Pre L 1.93 1.72  FEV1-Predicted Pre % 95 84  FEV1-Post L - 2.01  DLCO UNC% % - 61  DLCO COR %Predicted % - 66  TLC L - 5.31  TLC % Predicted % - 105  RV % Predicted % - 136    10/24/17 - OV Erica [name] presents to the interstitial lung disease clinic for evaluation of potential interstitial lung disease. She is followed in the general pulmonary clinic as stated above. Her other issues ongoing smoking. She is somewhat of a poor historian and as soon as I walked in she reported a new complaint of acute sinus congestion for the last 2 weeks after having picked up a cold from her sister. Also the ongoing spring pollen season is making things worse. She is coughing up some green sputum. She feels this needs to be treated by me and she wants an antibiotic for this. There is no associated wheezing or  cough or shortness of breath because of this.  We discussed her underlying lung disease and she tells me that she is aware that she has COPD/emphysema. But she is totally unaware about pulmonary fibrosis or interstitial lung disease. She tells me that overall compared to last year she is stable and she only has mild dyspnea on exertion without much of a cough. There is no chest pain no orthopnea proximal nocturnal dyspnea wheezing or hemoptysis or weight loss. 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 Only takes albuerol as needed. Her last CT scan of the chest high resolution was in January 2018 that thoracic radiology Dr. Lorin Picket described as indicative of UIP. I personally visualized the CT and I personally think this is indeterminate for UIP. There is bilateral bibasal craniocaudal gradients reticular abnormalities but I am not so sure that these are subpleural and more of I do not see clear-cut traction bronchiectasis or honeycombing although I will agree that there is no groundglass.  OV 07/27/18 - Follow up Patient presents today for a follow-up visit.  She was last seen by Derl Barrow on 03/08/2018.  She had a follow-up HRCT in October 2019 and was advised to follow-up with Dr. Chase Caller after scan.  That scan showed progressive pulmonary pattern of fibrosis consistent with UIP with diagnosis of idiopathic pulmonary fibrosis.  Patient has had multiple no-shows since that visit.  She states today that over the last 3 to 4 weeks she has had increased shortness of breath. States that she ran out of her Spiriva.  She has not used her Spiriva in the last 2 to 3 months.  She states that she gets tired when walking to her car or down the aisle in the grocery store.  She gets short of breath going up stairs.  She denies any recent fever or sinus congestion.  She denies any chest pain or edema.  She states that since her last visit she did start back  smoking.  She states that she has quit smoking again.  She has not smoked in the past month. Plan: Will order prednisone taper Will restart Spiriva - 2 puffs daily - samples given Continue Proventil as needed  OV 08/30/18 - Follow up Patient presents today for a follow-up.  She states that she has been doing well since her last visit with me on 07/27/2018.  She was treated for COPD exacerbation at last visit.  She completed her prednisone taper.  States that she is feeling much better.  Since her last visit with me she has seen her PCP who started her on Symbicort along with Spiriva.  She states that this is working well for her.  She states that she can walk further without getting winded now.  She has noticed in the afternoon she can go further walking her dog. Denies f/c/s, n/v/d, hemoptysis, PND, leg swelling.     Allergies  Allergen Reactions  . Penicillins Hives, Itching and Swelling    Tongue swells up Has patient had a PCN reaction causing immediate rash, facial/tongue/throat swelling, SOB or lightheadedness with hypotension: Yes Has patient had a PCN reaction causing severe rash involving mucus membranes or skin necrosis: Yes Has patient had a PCN reaction that required hospitalization Yes Has patient had a PCN reaction occurring within the last 10 years: No If all of the above answers are "NO", then may proceed with Cephalosporin use.     Immunization History  Administered Date(s) Administered  . Influenza Split 03/13/2017  . Influenza Whole 04/26/2018  . Influenza-Unspecified 03/13/2016    Past Medical History:  Diagnosis Date  . Arthritis    especially in shoulders  . Asthma   . Depression   . Diabetes mellitus   . GERD (gastroesophageal reflux disease)   . Headache(784.0)    "mild"  . Hypertension   . Mental disorder    depression  . Neuropathy    feet   . Pain    arthritis pain - takes tramadol as needed  . Rectal polyp    very little bleeding with bowel  movements- no pain    Tobacco History: Social History   Tobacco Use  Smoking Status Former Smoker  . Packs/day: 0.25  . Years: 20.00  . Pack years: 5.00  . Types: Cigarettes  . Last attempt to quit: 01/05/2018  . Years since quitting: 0.6  Smokeless Tobacco Never Used  Tobacco Comment   started smoking again July 2018, 2 cigs daily   Counseling given: Not Answered Comment: started smoking again July 2018, 2 cigs daily   Outpatient Encounter Medications as of 08/30/2018  Medication Sig  . albuterol (PROVENTIL HFA;VENTOLIN HFA) 108 (90 BASE) MCG/ACT inhaler Inhale 2 puffs into the lungs every 4 (four) hours as needed for wheezing.  Marland Kitchen albuterol (PROVENTIL) (2.5 MG/3ML) 0.083% nebulizer solution Take 3 mLs (2.5 mg total) by nebulization every 6 (six) hours as needed  for wheezing or shortness of breath.  Marland Kitchen amLODipine (NORVASC) 10 MG tablet TK 1 T PO QD  . gabapentin (NEURONTIN) 300 MG capsule Take 300 mg by mouth 3 (three) times daily.   Marland Kitchen losartan-hydrochlorothiazide (HYZAAR) 50-12.5 MG tablet Take 1 tablet by mouth daily.  . montelukast (SINGULAIR) 10 MG tablet Take 1 tablet (10 mg total) by mouth at bedtime.  . Multiple Vitamin (MULTIVITAMIN WITH MINERALS) TABS tablet Take 1 tablet by mouth daily.  . NON FORMULARY Take 1 capsule by mouth 4 (four) times daily. FDgard for reflux.  . nortriptyline (PAMELOR) 25 MG capsule TK 1 C PO QHS PRF SLP  . pantoprazole (PROTONIX) 40 MG tablet Take 40 mg by mouth daily.  . simvastatin (ZOCOR) 10 MG tablet Take 53ms at bedtime  . Tiotropium Bromide Monohydrate (SPIRIVA RESPIMAT) 1.25 MCG/ACT AERS Inhale 2 puffs into the lungs daily.  .Marland KitchentiZANidine (ZANAFLEX) 4 MG tablet Take 4 mg by mouth 3 (three) times daily as needed for spasms.  .Nelva NaySOLOSTAR 300 UNIT/ML SOPN Inject 100 Units into the skin at bedtime.   . [DISCONTINUED] Tiotropium Bromide Monohydrate (SPIRIVA RESPIMAT) 1.25 MCG/ACT AERS Inhale 2 puffs into the lungs daily.  .  [DISCONTINUED] Tiotropium Bromide Monohydrate (SPIRIVA RESPIMAT) 1.25 MCG/ACT AERS Inhale 2 puffs into the lungs daily.  . [DISCONTINUED] fluticasone (FLONASE) 50 MCG/ACT nasal spray Place 2 sprays into both nostrils daily. (Patient not taking: Reported on 08/30/2018)  . [DISCONTINUED] sodium chloride (OCEAN) 0.65 % SOLN nasal spray Place 1 spray into both nostrils as needed for congestion. (Patient not taking: Reported on 08/30/2018)   No facility-administered encounter medications on file as of 08/30/2018.      Review of Systems  Review of Systems  Constitutional: Negative.  Negative for chills and fever.  HENT: Negative.   Respiratory: Positive for shortness of breath. Negative for cough and wheezing.   Cardiovascular: Negative.  Negative for chest pain, palpitations and leg swelling.  Gastrointestinal: Negative.   Allergic/Immunologic: Negative.   Neurological: Negative.   Psychiatric/Behavioral: Negative.        Physical Exam  BP 130/70 (BP Location: Right Arm, Patient Position: Sitting, Cuff Size: Normal)   Pulse 69   Ht _0  (1.6 m)   Wt 182 lb 12.8 oz (82.9 kg)   SpO2 98%   BMI 32.38 kg/m   Wt Readings from Last 5 Encounters:  08/30/18 182 lb 12.8 oz (82.9 kg)  08/29/18 185 lb 4.8 oz (84.1 kg)  07/27/18 181 lb 6.4 oz (82.3 kg)  03/08/18 173 lb (78.5 kg)  09/06/17 170 lb (77.1 kg)     Physical Exam Vitals signs and nursing note reviewed.  Constitutional:      General: She is not in acute distress.    Appearance: She is well-developed.  Cardiovascular:     Rate and Rhythm: Normal rate and regular rhythm.  Pulmonary:     Effort: Pulmonary effort is normal. No respiratory distress.     Breath sounds: Normal breath sounds. No wheezing or rhonchi.  Musculoskeletal:        General: No swelling.  Neurological:     Mental Status: She is alert and oriented to person, place, and time.       Assessment & Plan:   Interstitial lung disease ([place] Patient has  been stable since last visit.  He is now taking Symbicort and Spiriva.  She states that this is working well for her.  Patient Instructions  Continue Spiriva Continue Symbicort - started by  PCP Continue Singulair Continue Flonase  Follow up: Follow up with Dr. Chase Caller , only, not NP,  at his first available appointment in around 2 months or sooner if needed       Fenton Foy, NP 08/30/2018

## 2018-08-30 NOTE — Patient Instructions (Signed)
Continue Spiriva Continue Symbicort - started by PCP Continue Singulair Continue Flonase  Follow up: Follow up with Dr. Chase Caller , only, not NP,  at his first available appointment in around 2 months or sooner if needed

## 2018-08-31 ENCOUNTER — Ambulatory Visit: Payer: Medicare HMO | Admitting: Hematology

## 2018-08-31 ENCOUNTER — Other Ambulatory Visit: Payer: Medicare HMO

## 2018-10-10 ENCOUNTER — Other Ambulatory Visit: Payer: Self-pay

## 2018-10-10 ENCOUNTER — Ambulatory Visit (INDEPENDENT_AMBULATORY_CARE_PROVIDER_SITE_OTHER): Payer: Medicare HMO | Admitting: Primary Care

## 2018-10-10 DIAGNOSIS — J449 Chronic obstructive pulmonary disease, unspecified: Secondary | ICD-10-CM

## 2018-10-10 DIAGNOSIS — J40 Bronchitis, not specified as acute or chronic: Secondary | ICD-10-CM | POA: Diagnosis not present

## 2018-10-10 DIAGNOSIS — J440 Chronic obstructive pulmonary disease with acute lower respiratory infection: Secondary | ICD-10-CM | POA: Diagnosis not present

## 2018-10-10 MED ORDER — TIOTROPIUM BROMIDE MONOHYDRATE 1.25 MCG/ACT IN AERS: 2.0000 | INHALATION_SPRAY | Freq: Every day | RESPIRATORY_TRACT | 5 refills | Status: DC

## 2018-10-10 MED ORDER — DOXYCYCLINE HYCLATE 100 MG PO TABS: 100.0000 mg | ORAL_TABLET | Freq: Two times a day (BID) | ORAL | 0 refills | Status: DC

## 2018-10-10 NOTE — Patient Instructions (Addendum)
Continue Symbicort twice daily Continue Spiriva daily (refill sent) Albuterol inh/neb every 6 hours for shortness of breath/wheezing  Continue Mucinex twice a day Continue Singulair and Zyretc   RX: Doxycycline twice daily x 1 week   Follow up: If no improvement please return or call     Allergies, Adult An allergy means that your body reacts to something that bothers it (allergen). It is not a normal reaction. This can happen from something that you:  Eat.  Breathe in.  Touch. You can have an allergy (be allergic) to:  Outdoor things, like: ? Pollen. ? Grass. ? Weeds.  Indoor things, like: ? Dust. ? Smoke. ? Pet dander.  Foods.  Medicines.  Things that bother your skin, like: ? Detergents. ? Chemicals. ? Latex.  Perfume.  Bugs. An allergy cannot spread from person to person (is not contagious). Follow these instructions at home:         Stay away from things that you know you are allergic to.  If you have allergies to things in the air, wash out your nose each day. Do it with one of these: ? A salt-water (saline) spray. ? A container (neti pot).  Take over-the-counter and prescription medicines only as told by your doctor.  Keep all follow-up visits as told by your doctor. This is important.  If you are at risk for a very bad allergy reaction (anaphylaxis), keep an auto-injector with you all the time. This is called an epinephrine injection. ? This is pre-measured medicine with a needle. You can put it into your skin by yourself. ? Right after you have a very bad allergy reaction, you or a person with you must give the medicine in less than a few minutes. This is an emergency.  If you have ever had a very bad allergy reaction, wear a medical alert bracelet or necklace. Your very bad allergy should be written on it. Contact a health care provider if:  Your symptoms do not get better with treatment. Get help right away if:  You have symptoms of  a very bad allergy reaction. These include: ? A swollen mouth, tongue, or throat. ? Pain or tightness in your chest. ? Trouble breathing. ? Being short of breath. ? Dizziness. ? Fainting. ? Very bad pain in your belly (abdomen). ? Throwing up (vomiting). ? Watery poop (diarrhea). Summary  An allergy means that your body reacts to something that bothers it (allergen). It is not a normal reaction.  Stay away from things that make your body react.  Take over-the-counter and prescription medicines only as told by your doctor.  If you are at risk for a very bad allergy reaction, carry an auto-injector (epinephrine injection) all the time. Also, wear a medical alert bracelet or necklace so people know about your allergy. This information is not intended to replace advice given to you by your health care provider. Make sure you discuss any questions you have with your health care provider. Document Released: 09/24/2012 Document Revised: 09/12/2016 Document Reviewed: 09/12/2016 Elsevier Interactive Patient Education  2019 [employer]  Acute Bronchitis, Adult Acute bronchitis is when air tubes (bronchi) in the lungs suddenly get swollen. The condition can make it hard to breathe. It can also cause these symptoms:  A cough.  Coughing up clear, yellow, or green mucus.  Wheezing.  Chest congestion.  Shortness of breath.  A fever.  Body aches.  Chills.  A sore throat. Follow these instructions at home:  Medicines  Take  over-the-counter and prescription medicines only as told by your doctor.  If you were prescribed an antibiotic medicine, take it as told by your doctor. Do not stop taking the antibiotic even if you start to feel better. General instructions  Rest.  Drink enough fluids to keep your pee (urine) pale yellow.  Avoid smoking and secondhand smoke. If you smoke and you need help quitting, ask your doctor. Quitting will help your lungs heal faster.  Use an  inhaler, cool mist vaporizer, or humidifier as told by your doctor.  Keep all follow-up visits as told by your doctor. This is important. How is this prevented? To lower your risk of getting this condition again:  Wash your hands often with soap and water. If you cannot use soap and water, use hand sanitizer.  Avoid contact with people who have cold symptoms.  Try not to touch your hands to your mouth, nose, or eyes.  Make sure to get the flu shot every year. Contact a doctor if:  Your symptoms do not get better in 2 weeks. Get help right away if:  You cough up blood.  You have chest pain.  You have very bad shortness of breath.  You become dehydrated.  You faint (pass out) or keep feeling like you are going to pass out.  You keep throwing up (vomiting).  You have a very bad headache.  Your fever or chills gets worse. This information is not intended to replace advice given to you by your health care provider. Make sure you discuss any questions you have with your health care provider. Document Released: 11/16/2007 Document Revised: 01/11/2017 Document Reviewed: 11/18/2015 Elsevier Interactive Patient Education  2019 [employer].

## 2018-10-10 NOTE — Progress Notes (Signed)
Virtual Visit via Telephone Note  I connected with Erica [name] on 10/10/18 at  4:00 PM EDT by telephone and verified that I am speaking with the correct person using two identifiers.  Location: Patient: Erica [name] Provider: Dr. Waldron Labs, NP   I discussed the limitations, risks, security and privacy concerns of performing an evaluation and management service by telephone and the availability of in person appointments. I also discussed with the patient that there may be a patient responsible charge related to this service. The patient expressed understanding and agreed to proceed.   History of Present Illness: 63 year old female, former smoker. PMH significant for COPD, ILD, GERD. Patient of Dr. Chase Caller, last seen by pulmonary NP on 08/30/18. Maintained on Symbicort 80, Spiriva respimat 1.27mg.    10/10/2018 Patient called for acute visit. Complains of cough with cloudy white-yellow mucus. Feels that it has been more productive recently. Associated nasal congestion, runny nose, sneezing. Taking mucinex twice a day. Started Zyrtec allergy medication this week. Breathing has been a little better. Uses nebulizer at night. Denies wheezing.   Observations/Objective:  - No shortness of breath, wheezing or cough noted during phone conversation  Assessment and Plan:  COPD - Stable; continue Symbicort and Spiriva  Bronchitis: - Likely triggered by seasonal allergies  - Take doxycycline 1 tab twice daily x 7 days - Continue mucinex twice daily  Allergic rhinitis: - Continue Zyrtec daily OTC prn seasonal allergy symptoms  - Continue Singulair 12mqhs   Follow Up Instructions:  - Return if no improvement    I discussed the assessment and treatment plan with the patient. The patient was provided an opportunity to ask questions and all were answered. The patient agreed with the plan and demonstrated an understanding of the instructions.   The patient was advised to call  back or seek an in-person evaluation if the symptoms worsen or if the condition fails to improve as anticipated.  I provided 15 minutes of non-face-to-face time during this encounter.   ElMartyn EhrichNP

## 2018-10-11 ENCOUNTER — Encounter: Payer: Self-pay | Admitting: Primary Care

## 2018-11-02 ENCOUNTER — Other Ambulatory Visit: Payer: Self-pay | Admitting: Hematology

## 2018-11-02 ENCOUNTER — Ambulatory Visit: Payer: Medicare HMO | Admitting: Internal Medicine

## 2018-11-02 DIAGNOSIS — N644 Mastodynia: Secondary | ICD-10-CM

## 2018-11-22 ENCOUNTER — Ambulatory Visit
Admission: RE | Admit: 2018-11-22 | Discharge: 2018-11-22 | Disposition: A | Discharge status: Home / Self Care | Payer: Medicare HMO | Source: Ambulatory Visit | Attending: Hematology | Admitting: Hematology

## 2018-11-22 ENCOUNTER — Other Ambulatory Visit: Payer: Self-pay

## 2018-11-22 DIAGNOSIS — N644 Mastodynia: Secondary | ICD-10-CM

## 2018-11-22 IMAGING — MG DIGITAL DIAGNOSTIC BILATERAL MAMMOGRAM WITH TOMO AND CAD
6 of 10 series · 6 of 30 positions shown · non-contrast
Comparison: Previous exam(s).

CLINICAL DATA: 63-year-old female presenting for evaluation of
focal left breast and left axillary pain.

EXAM:
DIGITAL DIAGNOSTIC BILATERAL MAMMOGRAM WITH CAD AND TOMO
ULTRASOUND LEFT BREAST

[L MLO synth-2D]
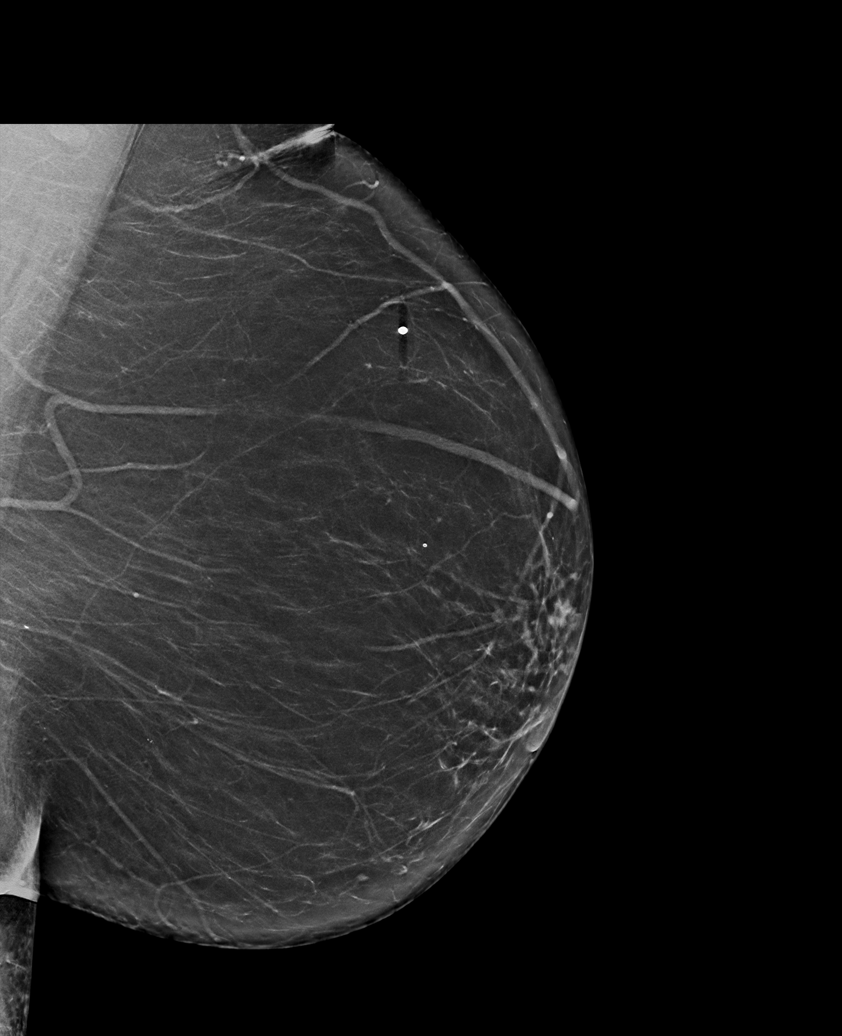

[L CC synth-2D (1 of 2)]
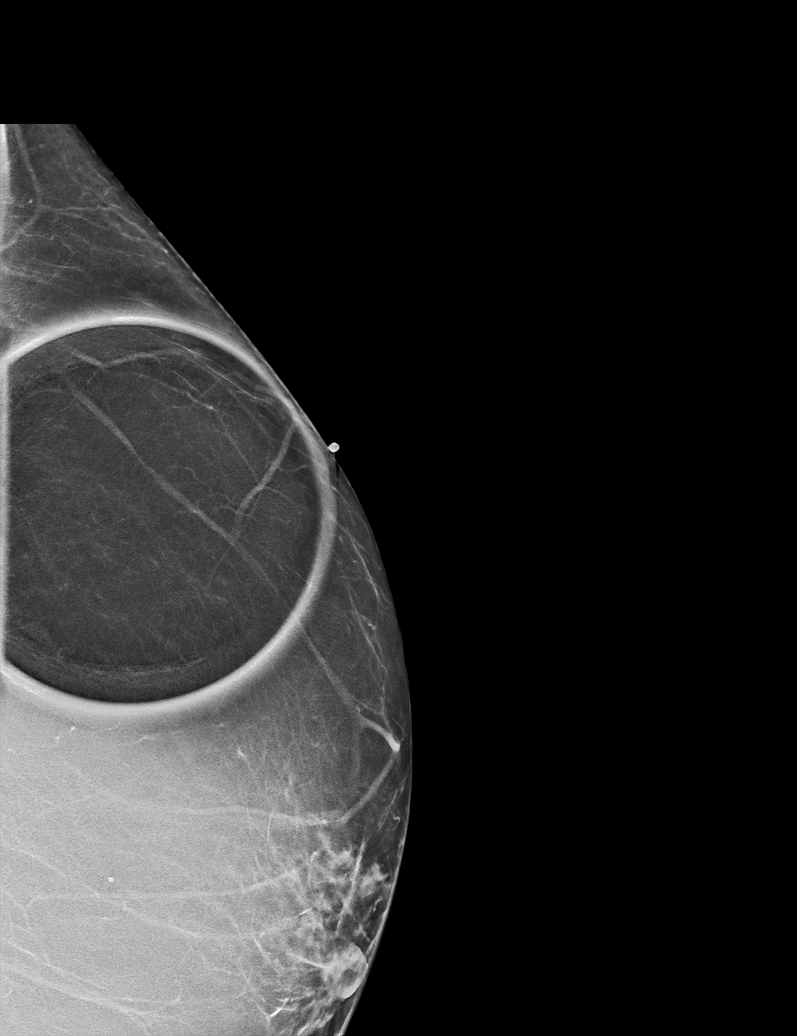

[L CC synth-2D (2 of 2)]
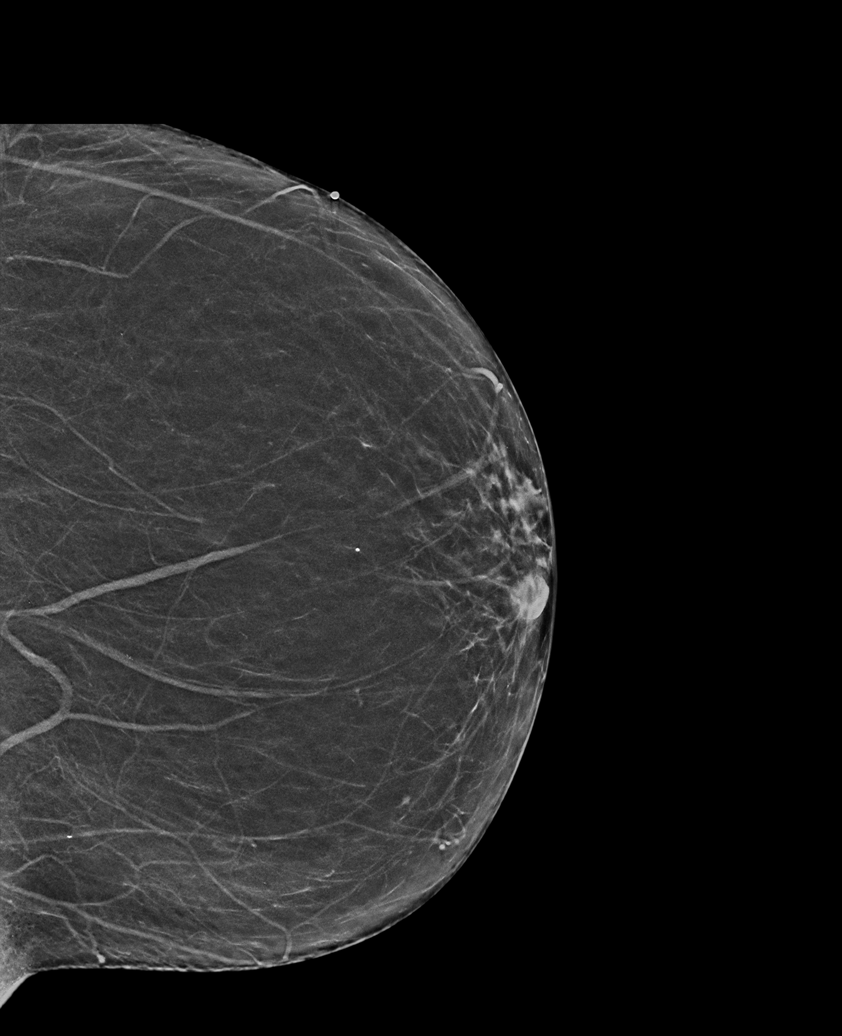

[R MLO synth-2D]
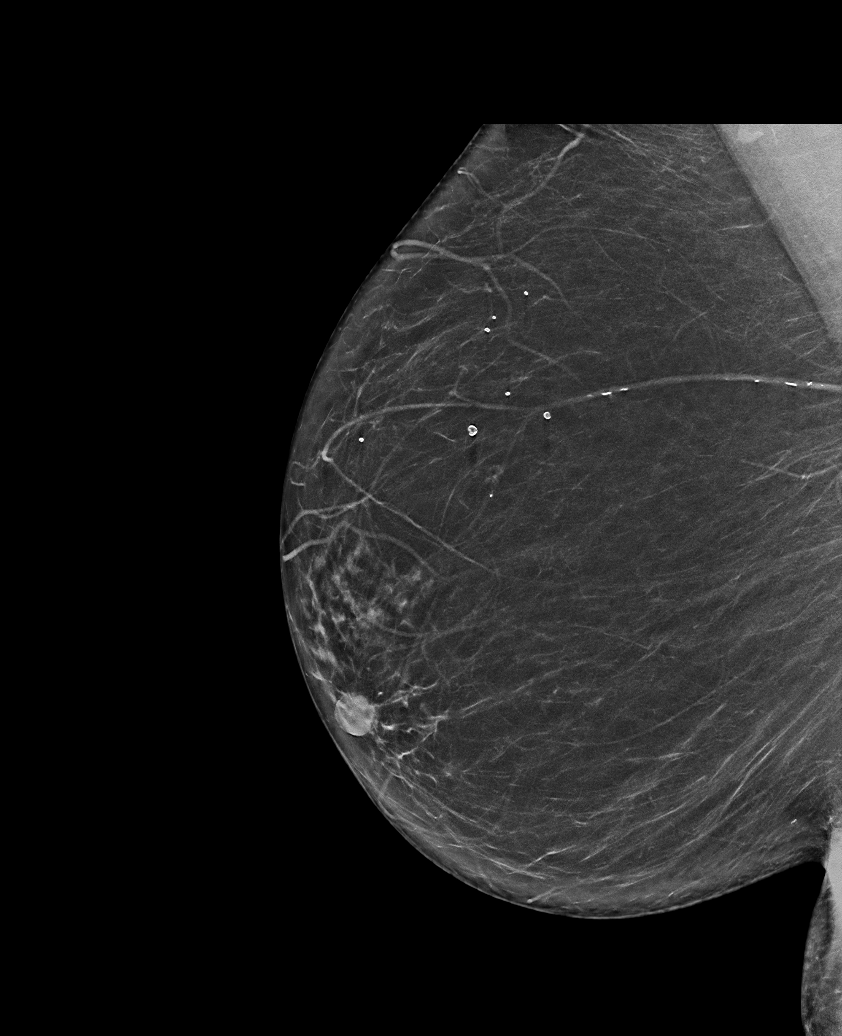

[R CC synth-2D]
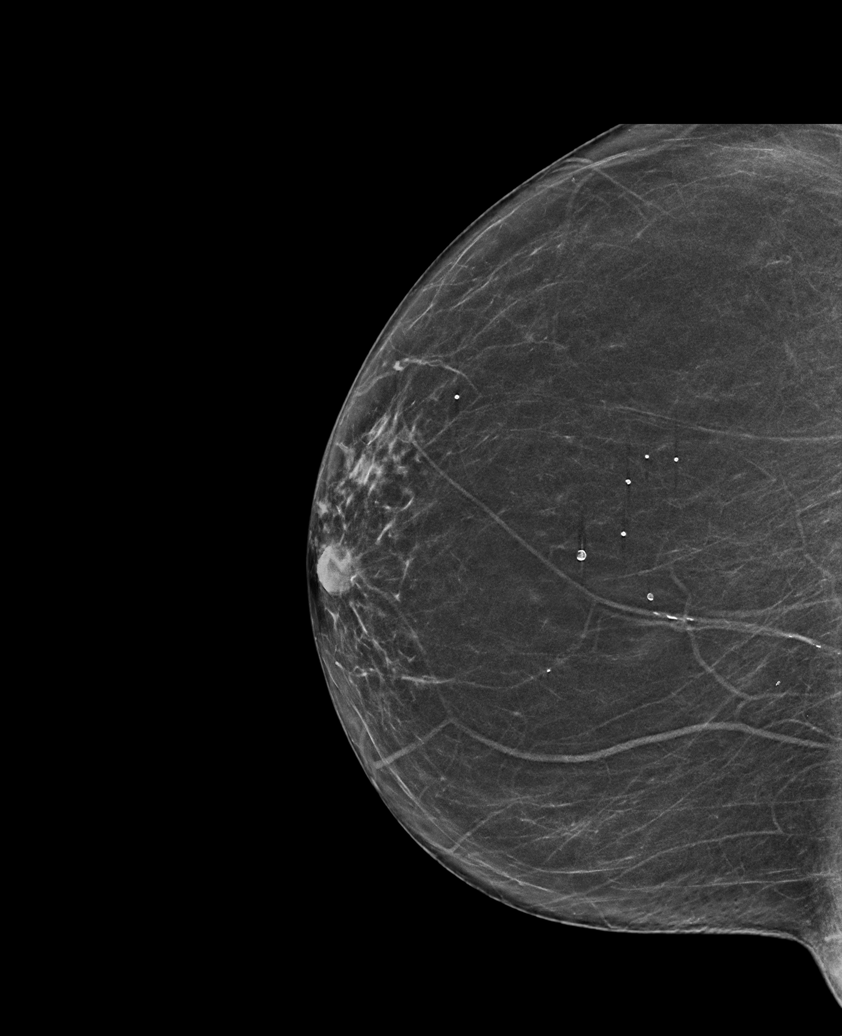

[L CC tomo · tomo slice 31/62.0]
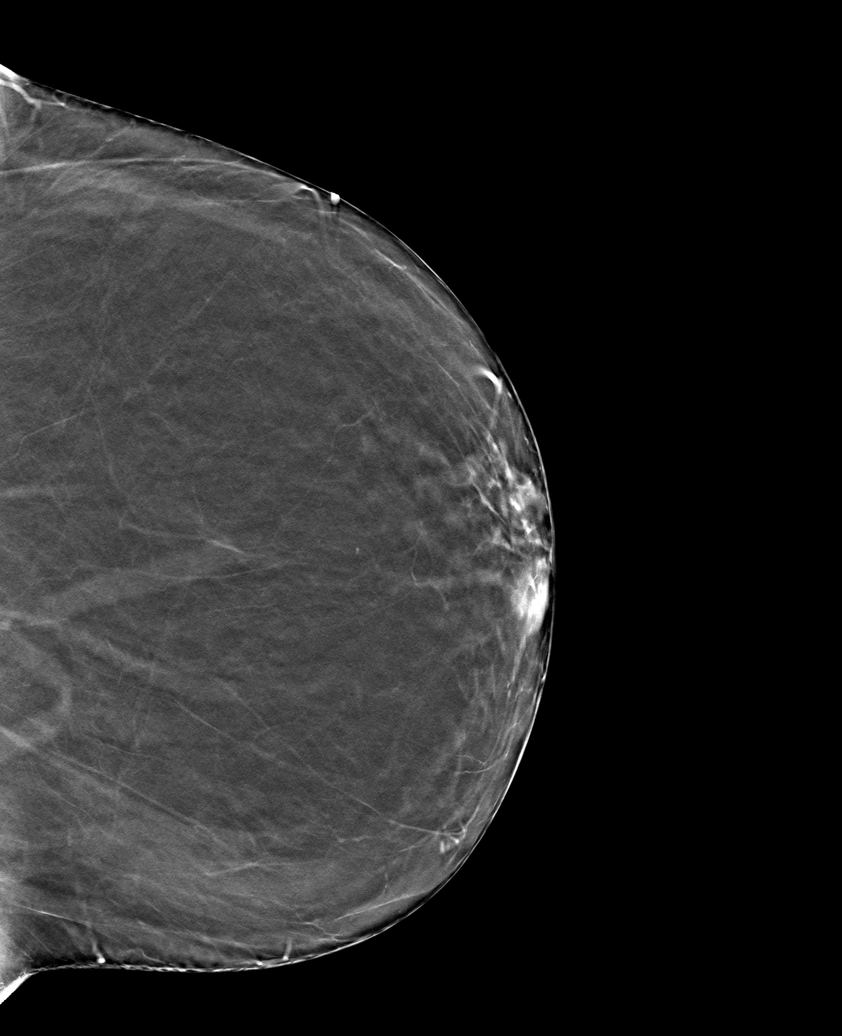

[6 of 30 positions shown; findings below may reference images not displayed]

ACR Breast Density Category b: There are scattered areas of
fibroglandular density.
FINDINGS: A BB has been placed along the upper-outer aspect of the left breast
indicating the tender site of concern. There are no suspicious
mammographic findings deep to the marker. No suspicious
calcifications, masses or areas of distortion are seen in the
bilateral breasts.

Mammographic images were processed with CAD.

Physical exam of the tender site in the upper-outer left breast
demonstrates no discrete palpable masses. At the time of the
ultrasound, the patient also described pain that extended into her
left axilla.

Ultrasound targeted to the upper-outer quadrant of the left breast
demonstrates normal fibroglandular tissue. No masses or suspicious
areas of shadowing are identified. Ultrasound of the left axilla
demonstrates multiple normal-appearing lymph nodes.
IMPRESSION: 1. There are no suspicious mammographic or targeted sonographic
abnormalities at the palpable site of concern in the upper-outer
left breast or in the left axilla.

2.  No mammographic evidence of malignancy in the bilateral breasts.

RECOMMENDATION:
1. Clinical follow-up recommended for the tender areas of concern in
the left breast and left axilla. Any further workup should be based
on clinical grounds.

2.  Screening mammogram in one year.(Code:[DG])

I have discussed the findings and recommendations with the patient.
Results were also provided in writing at the conclusion of the
visit. If applicable, a reminder letter will be sent to the patient
regarding the next appointment.

BI-RADS CATEGORY  1: Negative.

## 2018-11-22 IMAGING — US ULTRASOUND LEFT BREAST LIMITED
1 series · 3 of 3 positions shown · non-contrast
Comparison: Previous exam(s).

CLINICAL DATA: 63-year-old female presenting for evaluation of
focal left breast and left axillary pain.

EXAM:
DIGITAL DIAGNOSTIC BILATERAL MAMMOGRAM WITH CAD AND TOMO
ULTRASOUND LEFT BREAST

[Series 1: ultrasound left breast limited · 0.06mm/px · 3 of 3 slices shown]
[im 1/3]
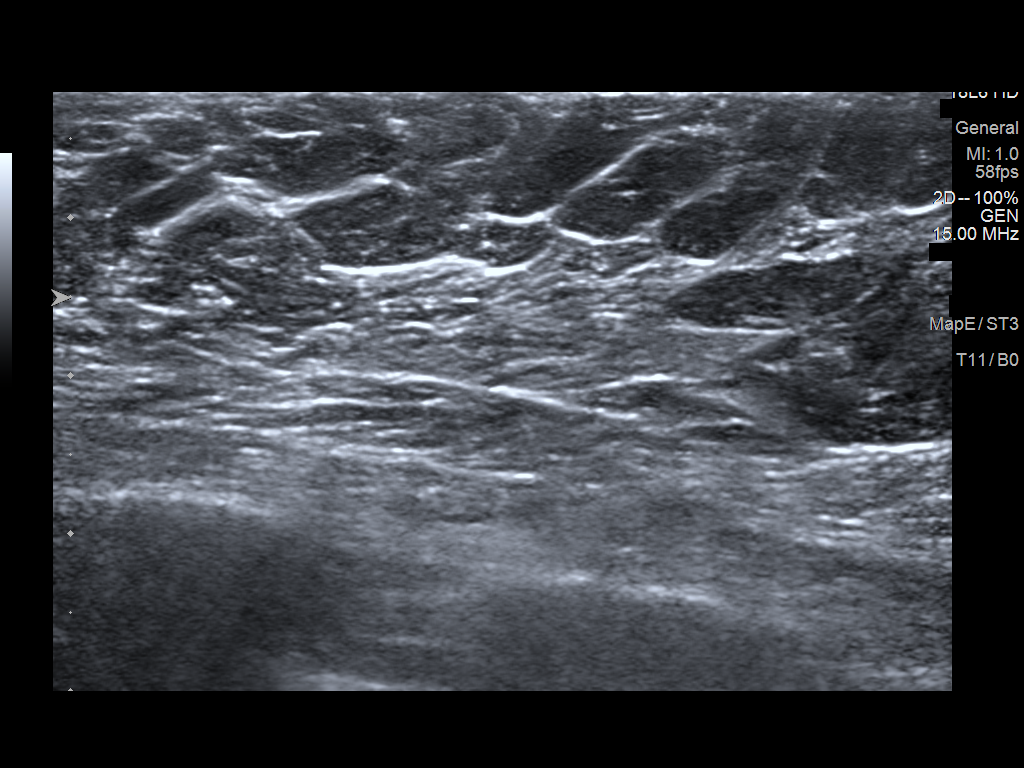
[im 2/3]
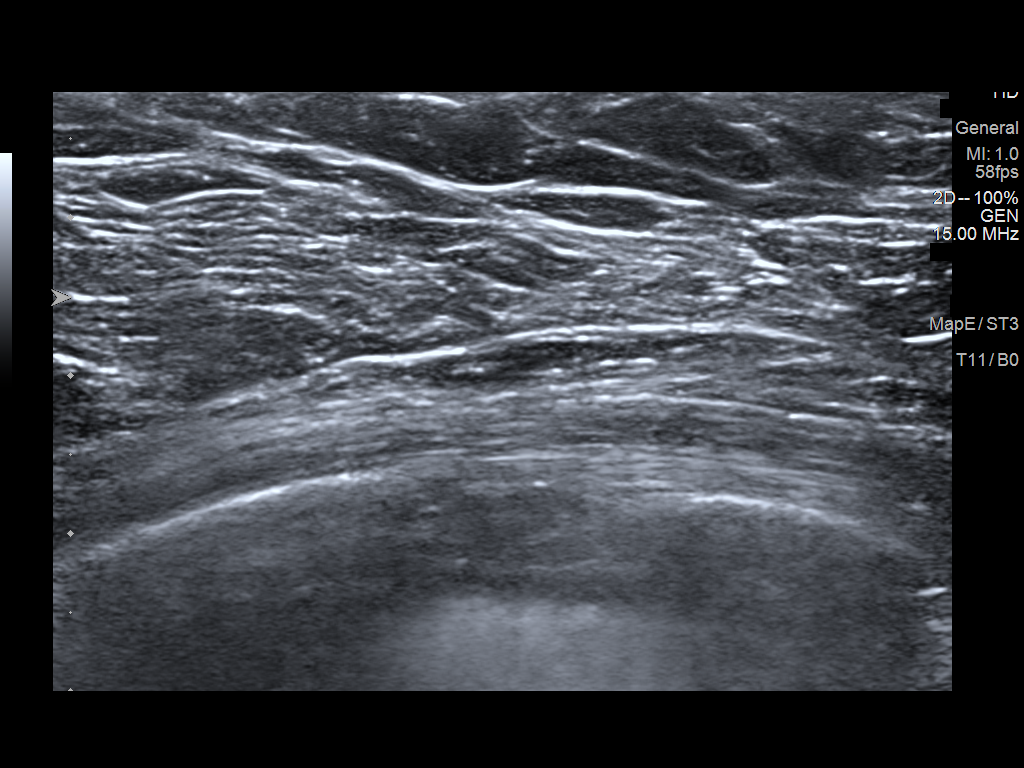
[im 3/3]
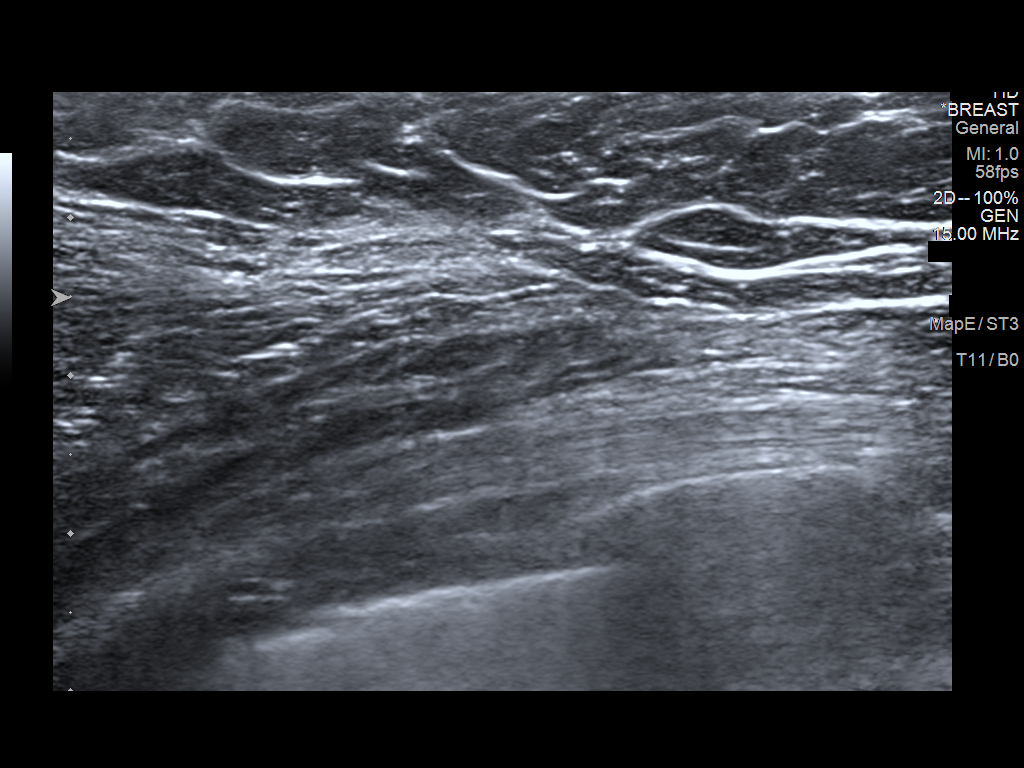

[3 of 3 positions shown; findings below may reference images not displayed]

ACR Breast Density Category b: There are scattered areas of
fibroglandular density.
FINDINGS: A BB has been placed along the upper-outer aspect of the left breast
indicating the tender site of concern. There are no suspicious
mammographic findings deep to the marker. No suspicious
calcifications, masses or areas of distortion are seen in the
bilateral breasts.

Mammographic images were processed with CAD.

Physical exam of the tender site in the upper-outer left breast
demonstrates no discrete palpable masses. At the time of the
ultrasound, the patient also described pain that extended into her
left axilla.

Ultrasound targeted to the upper-outer quadrant of the left breast
demonstrates normal fibroglandular tissue. No masses or suspicious
areas of shadowing are identified. Ultrasound of the left axilla
demonstrates multiple normal-appearing lymph nodes.
IMPRESSION: 1. There are no suspicious mammographic or targeted sonographic
abnormalities at the palpable site of concern in the upper-outer
left breast or in the left axilla.

2.  No mammographic evidence of malignancy in the bilateral breasts.

RECOMMENDATION:
1. Clinical follow-up recommended for the tender areas of concern in
the left breast and left axilla. Any further workup should be based
on clinical grounds.

2.  Screening mammogram in one year.(Code:[DG])

I have discussed the findings and recommendations with the patient.
Results were also provided in writing at the conclusion of the
visit. If applicable, a reminder letter will be sent to the patient
regarding the next appointment.

BI-RADS CATEGORY  1: Negative.

## 2018-12-03 ENCOUNTER — Telehealth: Payer: Self-pay | Admitting: Primary Care

## 2018-12-03 DIAGNOSIS — J40 Bronchitis, not specified as acute or chronic: Secondary | ICD-10-CM

## 2018-12-03 MED ORDER — PREDNISONE 10 MG PO TABS: ORAL_TABLET | ORAL | 0 refills | Status: DC

## 2018-12-03 NOTE — Telephone Encounter (Signed)
Attempted to call pt but unable to reach. Left message for pt to return call.  We will go ahead and place specimen cups up front for pt with the instructions for the sputum specimen collection so that way when she calls back, we can let her know all the information stated by [facility].

## 2018-12-03 NOTE — Telephone Encounter (Signed)
Left message for patient to call back.

## 2018-12-03 NOTE — Telephone Encounter (Signed)
Rx prednisone taper and needs respiratory sputum sample. If not better needs televisit. Orders already placed.

## 2018-12-03 NOTE — Telephone Encounter (Signed)
Patient is returning phone call.  Patient phone number is [phone].

## 2018-12-03 NOTE — Telephone Encounter (Signed)
Primary Pulmonologist: MR Last office visit and with whom: 10/10/2018 with Derl Barrow What do we see them for (pulmonary problems): ILD Last OV assessment/plan: Instructions  Continue Symbicort twice daily Continue Spiriva daily (refill sent) Albuterol inh/neb every 6 hours for shortness of breath/wheezing  Continue Mucinex twice a day Continue Singulair and Zyretc   RX: Doxycycline twice daily x 1 week   Follow up: If no improvement please return or call      Was appointment offered to patient (explain)?  Pt wants recommendations   Reason for call: Called and spoke with pt who stated she has had worsening SOB over the last two weeks. Pt also has complaints of cough with yellow phlegm.  Pt also has had a lot of headaches and has had pain in her face. Pt does have some rattling in chest.   Pt has had to use rescue inhaler at least three times and has used nebulizer at least once daily to help with her symptoms.   Pt is still using spiriva as well as all her other medications which are prescribed for her to take. Pt has not tried any other meds other than what she is currently taking.  Pt denied any complaints of loss of smell, no weakness, no diarrhea or muscle pain. Pt also denied any complaints of fever, rash or sore throat and also denied any joint pain or vomiting.  Pt wants to know what we recommend to help with her symptoms. Beth, please advise on this for pt. Thanks!  (

## 2018-12-04 NOTE — Telephone Encounter (Signed)
Pt returning call and can be reached @ [phone].Erica [name]

## 2018-12-04 NOTE — Telephone Encounter (Signed)
Called and spoke with pt letting her know the pred Rx had been sent to pharmacy for her and also let her know that we were needing to do a sputum culture. Stated to pt that the specimen collection kit and info was up front at foyer for her and she could come by office to pick it up. Pt expressed understanding. Nothing further needed.

## 2018-12-04 NOTE — Telephone Encounter (Signed)
ATC pt, no answer. Left message for pt to call back.  

## 2018-12-06 ENCOUNTER — Other Ambulatory Visit: Payer: Medicare HMO

## 2018-12-06 DIAGNOSIS — J40 Bronchitis, not specified as acute or chronic: Secondary | ICD-10-CM

## 2018-12-09 LAB — RESPIRATORY CULTURE OR RESPIRATORY AND SPUTUM CULTURE
ISOLATE 1:: NORMAL
MICRO NUMBER:: 607546
SPECIMEN QUALITY:: ADEQUATE

## 2018-12-10 ENCOUNTER — Telehealth: Payer: Self-pay

## 2018-12-10 NOTE — Progress Notes (Signed)
Please let patient know sputum culture was normal

## 2018-12-10 NOTE — Progress Notes (Signed)
Left voicemail for patient to return call.

## 2018-12-10 NOTE — Telephone Encounter (Signed)
-----  Message from Martyn Ehrich, NP sent at 12/10/2018  8:57 AM EDT ----- Please let patient know sputum culture was normal

## 2018-12-11 ENCOUNTER — Telehealth: Payer: Self-pay | Admitting: Primary Care

## 2018-12-11 NOTE — Telephone Encounter (Signed)
Relayed results.  Nothing further is needed.

## 2019-01-10 ENCOUNTER — Encounter (HOSPITAL_COMMUNITY): Payer: Self-pay | Admitting: *Deleted

## 2019-01-10 ENCOUNTER — Emergency Department (HOSPITAL_COMMUNITY)
Admission: EM | Admit: 2019-01-10 | Discharge: 2019-01-11 | Disposition: A | Discharge status: Home / Self Care | Payer: Medicare HMO | Attending: Emergency Medicine | Admitting: Emergency Medicine

## 2019-01-10 ENCOUNTER — Other Ambulatory Visit: Payer: Self-pay

## 2019-01-10 DIAGNOSIS — H5711 Ocular pain, right eye: Secondary | ICD-10-CM | POA: Insufficient documentation

## 2019-01-10 DIAGNOSIS — Z5321 Procedure and treatment not carried out due to patient leaving prior to being seen by health care provider: Secondary | ICD-10-CM | POA: Insufficient documentation

## 2019-01-10 DIAGNOSIS — H538 Other visual disturbances: Secondary | ICD-10-CM | POA: Insufficient documentation
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
## 2019-02-20 ENCOUNTER — Ambulatory Visit (INDEPENDENT_AMBULATORY_CARE_PROVIDER_SITE_OTHER): Payer: Medicare HMO | Admitting: Podiatry

## 2019-02-20 ENCOUNTER — Other Ambulatory Visit: Payer: Self-pay

## 2019-02-20 ENCOUNTER — Encounter: Payer: Self-pay | Admitting: Podiatry

## 2019-02-20 VITALS — BP 170/95 | HR 69

## 2019-02-20 DIAGNOSIS — L03031 Cellulitis of right toe: Secondary | ICD-10-CM | POA: Diagnosis not present

## 2019-02-20 MED ORDER — NEOMYCIN-POLYMYXIN-HC 3.5-10000-1 OT SOLN: OTIC | 0 refills | Status: DC

## 2019-02-20 NOTE — Patient Instructions (Signed)

## 2019-02-23 NOTE — Progress Notes (Signed)
Subjective:   Patient ID: Erica [name], female   DOB: 63 y.o.   MRN: [id number]   HPI Patient presents with a very damaged right hallux nail that she states is been draining locally and has been painful.  Does not seem to remember any specific injury associated with this and states that it has been localized and painful.  Patient does not smoke likes to be active   Review of Systems  All other systems reviewed and are negative.       Objective:  Physical Exam Vitals signs and nursing note reviewed.  Constitutional:      Appearance: She is well-developed.  Pulmonary:     Effort: Pulmonary effort is normal.  Musculoskeletal: Normal range of motion.  Skin:    General: Skin is warm.  Neurological:     Mental Status: She is alert.     Neurovascular status intact muscle strength was found to be adequate with patient's right hallux nail found to be partially loose painful when palpated and gradually making shoe gear more difficult with no proximal edema erythema drainage noted and patient having good digital perfusion     Assessment:  Significant paronychia infection localized right hallux to the medial lateral and central border     Plan:  H&P and using sterile instrumentation I remove the medial lateral border and I did note localized necrotic tissue process which I flushed and did not note any proximal extension or infection.  The rest of the nail was loose I went ahead and removed it and I explained soaks and the fact that this is a local process and should heal uneventfully.  Reappoint if any issues were to occur and encouraged this to call very strongly if she has any redness swelling or other pathology

## 2019-03-05 ENCOUNTER — Other Ambulatory Visit: Payer: Self-pay

## 2019-03-05 ENCOUNTER — Ambulatory Visit (INDEPENDENT_AMBULATORY_CARE_PROVIDER_SITE_OTHER): Payer: Medicare HMO | Admitting: Podiatry

## 2019-03-05 ENCOUNTER — Encounter: Payer: Self-pay | Admitting: Podiatry

## 2019-03-05 DIAGNOSIS — L03031 Cellulitis of right toe: Secondary | ICD-10-CM

## 2019-03-05 MED ORDER — DOXYCYCLINE HYCLATE 100 MG PO TABS: 100.0000 mg | ORAL_TABLET | Freq: Two times a day (BID) | ORAL | 0 refills | Status: DC

## 2019-03-11 NOTE — Progress Notes (Signed)
Subjective:   Patient ID: Erica [name], female   DOB: 63 y.o.   MRN: [id number]   HPI Patient presents stating concerned about some irritation around the ingrown toenail correction left stating there is some redness and drainage and she was concerned about this   ROS      Objective:  Physical Exam  Neurovascular status unchanged with mild redness around the left hallux medial side localized in nature with no proximal edema erythema drainage noted     Assessment:  Low-grade paronychia with previous ingrown that appears to be localized with no proximal spread currently     Plan:  H&P condition reviewed and discussed and as precautionary measure placed on doxycycline and instructed that if any further redness or drainage were to occur to inform us immediately

## 2019-04-08 ENCOUNTER — Other Ambulatory Visit: Payer: Self-pay

## 2019-04-08 ENCOUNTER — Ambulatory Visit (INDEPENDENT_AMBULATORY_CARE_PROVIDER_SITE_OTHER): Payer: Medicare HMO | Admitting: Internal Medicine

## 2019-04-08 ENCOUNTER — Encounter: Payer: Self-pay | Admitting: Internal Medicine

## 2019-04-08 VITALS — BP 132/76 | HR 78 | Temp 97.0°F | Ht 63.0 in | Wt 188.6 lb

## 2019-04-08 DIAGNOSIS — J849 Interstitial pulmonary disease, unspecified: Secondary | ICD-10-CM | POA: Diagnosis not present

## 2019-04-08 NOTE — Patient Instructions (Addendum)
ICD-10-CM   1. Interstitial lung disease ([place])  J84.9      - I am concerned you  have Interstitial Lung Disease (ILD) other wise called Pulmonary fibrosis - this is getting worse I I think  -  There are MANY varieties of this - the variety you likely have is one called IPF   - To narrow down possibilities, assess severity and discuss treatment please do the following tests  - do spirometry and dlco test (no BD response, no DLCO; choose a location depending on schedule and convenience)  - do walking test on room air in the office (not 6 min walk test)  - do overnight oxygen test on room air  - do High Resolution CT chest wo contrast (only Dr Rosario Jacks or Dr Weber Cooks or Dr Polly Cobia to read)   Followup - next few to several weeks but after completing above  - pneumovax vaccine at followup  - address coronary artery calcification at followup

## 2019-04-08 NOTE — Progress Notes (Addendum)
HPI   This is the case of Erica [name], 63 y.o. Female, who was referred by Dr. Lorella Nimrod in consultation regarding abnormal chest ct scan.   As you very well know, patient smoked 1 pack per week since her 21s, curently down to a cigarette a day, was diagnosed with asthma/copd several yrs ago. Uses alb MDI prn, usually 1-2 x/week. She gets more than SOB with more than ADLs. She is not on o2.   She had rectal CA in 2014 for which she had surgery. Still in remission.  She had a chest ct scan in 01/2015 as part of CA surveillance.  That showed several pulm nodules (subcm) which have been stable, and some smaller.  Also with increased markings at the bases.   She had sinus issues in December.  Wen tot ER as she was not getting better.  CXR showed inc interstitial markings, hence the consult today. She is almost back to her baseline.   She has GERD, controlled with meds. She coughs daily, coughs in sleep. Cough worse at night.  On and off wheezing at HS.   Her sinus issues are better. Has PND.    Has snoring, gaspin, choking, unrefreshed sleep.  Wakes up tired and fatigued.  ESS 10. Hypersomnia affects her fxnality.    ROV  08/12/16 Patient returns to the office as follow-up after her tests. Since last seen, she has quit smoking. She had PFT  which showed mild COPD with an FEV1 of 1. 72 or 84% of predicted. Diffusion capacity was 61%. Her sleep study was (-) for OSA. She is able to do her ADLs without much difficulty. Gets winded with more than ADLs. Her reflux is controlled with medicines and diet changes. Her cough is also stable.     09/06/2017 Follow up : COPD and ILD  Pt returns for 1 year follow up . She was last seen in March 2018 . Found to have mild COPD and ILD changes on CT chest that are mild worse than 2015 CT scan .  She had autoimmune labs that essentially neg. She did not keep follow up .  Recommended on smoking cessation.  Says that she has baseline dyspnea with  activity none at rest , and dry cough .  Over last year feels dyspnea with activity has gotten worse. She gets winded easily .  No increased cough .  Still smoking, cessation discussed.   OV 10/24/2017  Chief Complaint  Patient presents with  . Follow-up    PFT was attempted today but pt unable to complete due to some sinus problems. Pt has some yellow to green mucus postnasally and also is coughing up a lot of mucus as well. Pt also states she has increased SOB due to symptoms.    Erica [name] presents to the interstitial lung disease clinic for evaluation of potential interstitial lung disease.  She is followed in the general pulmonary clinic as stated above.  Her other issues ongoing smoking.  She is somewhat of a poor historian and as soon as I walked in she reported a new complaint of acute sinus congestion for the last 2 weeks after having picked up a cold from her sister.  Also the ongoing spring pollen season is making things worse.  She is coughing up some green sputum.  She feels this needs to be treated by me and she wants an antibiotic for this.  There is no associated wheezing or cough or shortness  of breath because of this.  We discussed her underlying lung disease and she tells me that she is aware that she has COPD/emphysema.  But she is totally unaware about pulmonary fibrosis or interstitial lung disease.  She tells me that overall compared to last year she is stable and she only has mild dyspnea on exertion without much of a cough.  There is no chest pain no orthopnea proximal nocturnal dyspnea wheezing or hemoptysis or weight loss.  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  Only takes albuerol as needed.  Her last CT scan of the chest high resolution was in January 2018 that thoracic radiology Dr. Lorin Picket described as indicative of UIP.  I personally visualized the CT and I personally think this is indeterminate for UIP.  There is  bilateral bibasal craniocaudal gradients reticular abnormalities but I am not so sure that these are subpleural and more of I do not see clear-cut traction bronchiectasis or honeycombing although I will agree that there is no groundglass.   She has ongoing smoking    Results for SHEKELA, GOODRIDGE (MRN [id number]) as of 10/24/2017 16:28  Ref. Range 08/12/2016 14:23 09/07/2016 11:20  Anit Nuclear Antibody(ANA) Latest Ref Range: NEGATIVE  NEG   ANCA Proteinase 3 Latest Ref Range: 0.0 - 3.5 U/mL  <3.5  Anti JO-1 Latest Ref Range: 0.0 - 0.9 AI <4.7 <4.2  Cyclic Citrullin Peptide Ab Latest Units: Units <16   ds DNA Ab Latest Units: IU/mL <1   Myeloperoxidase Ab Latest Ref Range: 0.0 - 9.0 U/mL  <9.0  Cytoplasmic (C-ANCA) Latest Ref Range: <1:20  1:40 (H)   Scleroderma (Scl-70) (ENA) Antibody, IgG Latest Ref Range: <1.0 NEG AI  <1.0 NEG        OV 04/08/2019  Subjective:  Patient ID: Erica [name], female , DOB: 26-Jun-1955 , age 71 y.o. , MRN: [id number] , ADDRESS: [place] [id number]   04/08/2019 -   Chief Complaint  Patient presents with  . Follow-up    Patient reports that she still has sob with exertion and some cough. She states that some days are better than others.    3Follow-up combination interstitial lung disease/UIP pattern with negative serology and emphysema  [name] 63 y.o. -returns for follow-up.  Last seen by myself in May 2019.  At that point in time I ordered a high-resolution CT chest.  She had it but has not properly followed up.  She in between had a telephone visit with one of the nurse practitioners and was given COPD medication refill.  After that she had a visit with another nurse practitioner.  She tells me that in the last year she is quit smoking.  She is also quit working because of dyspnea on exertion relieved by rest.  She tells me that since last year her dyspnea is progressive.  She is able to climb 1 flight of stairs and then  has to stop because of extreme shortness of breath.  She is continuing with her inhalers.  She does not have nighttime or daytime oxygen.  Review of the CT scan and visualization of the CT scan from 1 year ago shows UIP features.  There are no other new issues.      Simple office walk 185 feet x  3 laps goal with forehead probe 10/24/2017  04/08/2019   O2 used Room air Room air  Number laps completed 3 3  Comments about pace Normal pace   Resting Pulse Ox/HR 97% and 77/min 100%  Final Pulse Ox/HR 97% and 103/min 97% and 105/min  Desaturated </= 88% no no  Desaturated <= 3% points no Yes, 3 points  Got Tachycardic >/= 90/min yes yes  Symptoms at end of test No comment x  Miscellaneous comments No comment from CMA x     Results for LAYNE, LEBON (MRN [id number]) as of 10/24/2017 16:28  Ref. Range 08/11/2016 10:13 10/24/2017 14:58  FVC-Pre Latest Units: L 2.56 2.79  FVC-%Pred-Pre Latest Units: % 98 108  Results for NORRIS, BODLEY (MRN [id number]) as of 10/24/2017 16:28  Ref. Range 08/11/2016 10:13  DLCO cor Latest Units: ml/min/mmHg 14.73  DLCO cor % pred Latest Units: % 60       CLINICAL DATA:  Interstitial lung disease.  EXAM: CT CHEST WITHOUT CONTRAST - 04/09/2018  TECHNIQUE: Multidetector CT imaging of the chest was performed following the standard protocol without intravenous contrast. High resolution imaging of the lungs, as well as inspiratory and expiratory imaging, was performed.  COMPARISON:  07/06/2016, PET 05/14/2015 and CT chest 01/12/2015.  FINDINGS: Cardiovascular: Coronary artery calcification. Heart is at the upper limits of normal in size to mildly enlarged. No pericardial effusion.  Mediastinum/Nodes: Mediastinal lymph nodes measure up to 1.2 cm in the low right paratracheal station, as before. Hilar regions are difficult to evaluate without IV contrast. No axillary adenopathy. Mild esophageal dilatation. Esophagus is otherwise  unremarkable.  Lungs/Pleura: Worsening peripheral and basilar predominant subpleural reticulation, ground-glass, traction bronchiectasis/bronchiolectasis and probable honeycombing. No air trapping. Perifissural nodules measure up to 8 x 13 mm along the left major fissure, minimally increased from 07/06/2016 with still felt to represent subpleural lymph nodes. No pleural fluid. Airway is unremarkable.  Upper Abdomen: Visualized portion of the liver is unremarkable. Stones layer in the gallbladder. Visualized portions of the adrenal glands, kidneys, spleen, pancreas, stomach and bowel are grossly unremarkable. There may be a small hiatal hernia. Upper abdominal lymph nodes are not enlarged by CT size criteria.  Musculoskeletal: Degenerative changes in the spine.  IMPRESSION: 1. Progressive pulmonary parenchymal pattern of fibrosis, as described above. Findings are consistent with UIP per consensus guidelines: Diagnosis of Idiopathic Pulmonary Fibrosis: An Official ATS/ERS/JRS/ALAT Clinical Practice Guideline. [place], Iss 5, [phone], Feb 11 2017. 2. Cholelithiasis. 3. Coronary artery calcification.   ROS - per HPI     has a past medical history of Arthritis, Asthma, Depression, Diabetes mellitus, GERD (gastroesophageal reflux disease), Headache(784.0), Hypertension, Mental disorder, Neuropathy, Pain, and Rectal polyp.   reports that she quit smoking about 15 months ago. Her smoking use included cigarettes. She has a 5.00 pack-year smoking history. She has never used smokeless tobacco.  Past Surgical History:  Procedure Laterality Date  . ANAL RECTAL MANOMETRY N/A 07/13/2016   Procedure: ANO RECTAL MANOMETRY;  Surgeon: Leighton Ruff, MD;  Location: [facility];  Service: Endoscopy;  Laterality: N/A;  . CESAREAN SECTION    . EUS N/A 11/21/2012   Procedure: LOWER ENDOSCOPIC ULTRASOUND (EUS);  Surgeon: Arta Silence, MD;  Location: [facility];   Service: Endoscopy;  Laterality: N/A;  . FLEXIBLE SIGMOIDOSCOPY N/A 11/21/2012   Procedure: FLEXIBLE SIGMOIDOSCOPY;  Surgeon: Arta Silence, MD;  Location: [facility];  Service: Endoscopy;  Laterality: N/A;  . FLEXIBLE SIGMOIDOSCOPY N/A 01/28/2013   Procedure: FLEXIBLE SIGMOIDOSCOPY;  Surgeon: Leighton Ruff, MD;  Location: [facility];  Service: Endoscopy;  Laterality:  N/A;  . LAPAROSCOPIC LOW ANTERIOR RESECTION N/A 01/29/2013   Procedure: LAPAROSCOPIC LOW ANTERIOR RESECTION, Rigid Proctoscopy;  Surgeon: Leighton Ruff, MD;  Location: [facility];  Service: General;  Laterality: N/A;  . LAPAROSCOPIC SIGMOID COLECTOMY N/A 11/14/2012   Procedure: DIAGNOSTIC LAPAROSCOPY AND SIGMOIDMOIDOSCOPY ;  Surgeon: Rolm Bookbinder, MD;  Location: [facility];  Service: General;  Laterality: N/A;  . RECTAL ULTRASOUND N/A 07/13/2016   Procedure: RECTAL ULTRASOUND;  Surgeon: Leighton Ruff, MD;  Location: [facility];  Service: Endoscopy;  Laterality: N/A;  . TONSILLECTOMY    . TONSILLECTOMY AND ADENOIDECTOMY      Allergies  Allergen Reactions  . Penicillins Hives, Itching and Swelling    Tongue swells up Has patient had a PCN reaction causing immediate rash, facial/tongue/throat swelling, SOB or lightheadedness with hypotension: Yes Has patient had a PCN reaction causing severe rash involving mucus membranes or skin necrosis: Yes Has patient had a PCN reaction that required hospitalization Yes Has patient had a PCN reaction occurring within the last 10 years: No If all of the above answers are "NO", then may proceed with Cephalosporin use.     Immunization History  Administered Date(s) Administered  . Influenza Split 03/13/2017  . Influenza Whole 04/26/2018  . Influenza,inj,Quad PF,6+ Mos 03/09/2019  . Influenza-Unspecified 03/13/2016    Family History  Problem Relation Age of Onset  . Hypertension Father   . Stroke Father   . Diabetes Father   . Heart disease Mother   . Hypertension Mother   .  Hyperlipidemia Mother   . Hypertension Sister   . Hypertension Brother   . Hypertension Sister   . Hypertension Brother   . Cancer Other 47       colon cancer   . Cancer Maternal Aunt        cancer, unknown type   . Cancer Maternal Uncle        bone cancer   . Cancer Paternal Aunt        lung cancer  . Cancer Paternal Uncle        lung cancer  . Cancer Maternal Uncle        colon cancer and prostate cancer   . Cancer Maternal Uncle        prostate cancer      Current Outpatient Medications:  .  albuterol (PROVENTIL HFA;VENTOLIN HFA) 108 (90 BASE) MCG/ACT inhaler, Inhale 2 puffs into the lungs every 4 (four) hours as needed for wheezing., Disp: , Rfl:  .  albuterol (PROVENTIL) (2.5 MG/3ML) 0.083% nebulizer solution, Take 3 mLs (2.5 mg total) by nebulization every 6 (six) hours as needed for wheezing or shortness of breath., Disp: 75 mL, Rfl: 12 .  amLODipine (NORVASC) 10 MG tablet, TK 1 T PO QD, Disp: , Rfl: 2 .  cetirizine (ZYRTEC) 10 MG tablet, TK 1 T PO QPM PRF ALLERGIES, Disp: , Rfl:  .  doxycycline (VIBRA-TABS) 100 MG tablet, Take 1 tablet (100 mg total) by mouth 2 (two) times daily., Disp: 20 tablet, Rfl: 0 .  gabapentin (NEURONTIN) 300 MG capsule, Take 300 mg by mouth 3 (three) times daily. , Disp: , Rfl:  .  losartan-hydrochlorothiazide (HYZAAR) 50-12.5 MG tablet, Take 1 tablet by mouth daily., Disp: , Rfl:  .  montelukast (SINGULAIR) 10 MG tablet, Take 1 tablet (10 mg total) by mouth at bedtime., Disp: 30 tablet, Rfl: 5 .  Multiple Vitamin (MULTIVITAMIN WITH MINERALS) TABS tablet, Take 1 tablet by mouth daily., Disp: , Rfl:  .  neomycin-polymyxin-hydrocortisone (CORTISPORIN) OTIC solution, Apply 1 to 2 drops to toe BID after soaking, Disp: 10 mL, Rfl: 0 .  NON FORMULARY, Take 1 capsule by mouth 4 (four) times daily. FDgard for reflux., Disp: , Rfl:  .  nortriptyline (PAMELOR) 25 MG capsule, TK 1 C PO QHS PRF SLP, Disp: , Rfl: 5 .  nystatin (MYCOSTATIN) 100000 UNIT/ML  suspension, SAS 5 ML PO Q 6 H FOR 5 DAYS, Disp: , Rfl:  .  pantoprazole (PROTONIX) 40 MG tablet, Take 40 mg by mouth daily., Disp: , Rfl:  .  simvastatin (ZOCOR) 10 MG tablet, Take 22ms at bedtime, Disp: , Rfl: 5 .  SYMBICORT 160-4.5 MCG/ACT inhaler, INL 2 PFS PO BID, Disp: , Rfl:  .  Tiotropium Bromide Monohydrate (SPIRIVA RESPIMAT) 1.25 MCG/ACT AERS, Inhale 2 puffs into the lungs daily., Disp: 1 Inhaler, Rfl: 5 .  tiZANidine (ZANAFLEX) 4 MG tablet, Take 4 mg by mouth 3 (three) times daily as needed for spasms., Disp: , Rfl: 5 .  tobramycin-dexamethasone (TOBRADEX) ophthalmic solution, , Disp: , Rfl:  .  TOUJEO SOLOSTAR 300 UNIT/ML SOPN, Inject 100 Units into the skin at bedtime. , Disp: , Rfl: 1 .  traMADol (ULTRAM) 50 MG tablet, TK 1 T PO  BID PRN, Disp: , Rfl:  .  venlafaxine XR (EFFEXOR-XR) 75 MG 24 hr capsule, TK ONE C PO D, Disp: , Rfl:       Objective:   Vitals:   04/08/19 1202  BP: 132/76  Pulse: 78  Temp: (!) 97 F (36.1 C)  TempSrc: Temporal  SpO2: 98%  Weight: 85.5 kg  Height: _0  (1.6 m)    Estimated body mass index is 33.41 kg/m as calculated from the following:   Height as of this encounter: _1  (1.6 m).   Weight as of this encounter: 85.5 kg.  _2 @  Filed Weights   04/08/19 1202  Weight: 85.5 kg     Physical Exam  General Appearance:    Alert, cooperative, no distress, appears stated age - yes , Deconditioned looking - no , OBESE  - yes, Sitting on Wheelchair -  no  Head:    Normocephalic, without obvious abnormality, atraumatic  Eyes:    PERRL, conjunctiva/corneas clear,  Ears:    Normal TM's and external ear canals, both ears  Nose:   Nares normal, septum midline, mucosa normal, no drainage    or sinus tenderness. OXYGEN ON  - no . Patient is @ ra   Throat:   Lips, mucosa, and tongue normal; teeth and gums normal. Cyanosis on lips - no  Neck:   Supple, symmetrical, trachea midline, no adenopathy;    thyroid:  no  enlargement/tenderness/nodules; no carotid   bruit or JVD  Back:     Symmetric, no curvature, ROM normal, no CVA tenderness  Lungs:     Distress - no , Wheeze no, Barrell Chest - no, Purse lip breathing - no, Crackles - yes at base   Chest Wall:    No tenderness or deformity.    Heart:    Regular rate and rhythm, S1 and S2 normal, no rub   or gallop, Murmur - no  Breast Exam:    NOT DONE  Abdomen:     Soft, non-tender, bowel sounds active all four quadrants,    no masses, no organomegaly. Visceral obesity - yes  Genitalia:   NOT DONE  Rectal:   NOT DONE  Extremities:   Extremities - normal, Has Cane -  yes, Clubbing - no, Edema - no  Pulses:   2+ and symmetric all extremities  Skin:   Stigmata of Connective Tissue Disease - no  Lymph nodes:   Cervical, supraclavicular, and axillary nodes normal  Psychiatric:  Neurologic:   Pleasant - yes, Anxious - no, Flat affect - yes  CAm-ICU - neg, Alert and Oriented x 3 - yes, Moves all 4s - yes, Speech - normal, Cognition - intact           Assessment:       ICD-10-CM   1. Interstitial lung disease ([place])  J84.9        Plan:     Patient Instructions     ICD-10-CM   1. Interstitial lung disease ([place])  J84.9      - I am concerned you  have Interstitial Lung Disease (ILD) other wise called Pulmonary fibrosis - this is getting worse I I think  -  There are MANY varieties of this - the variety you likely have is one called IPF   - To narrow down possibilities and assess severity please do the following tests  - do spirometry and dlco test (no BD response, no DLCO; choose a location depending on schedule and convenience)  - do walking test on room air in the office (not 6 min walk test)  - do overnight oxygen test on room air  - do High Resolution CT chest wo contrast (only Dr Rosario Jacks or Dr Weber Cooks or Dr Polly Cobia to read)   Followup - next few to several weeks but after completing above  - pneumovax vaccine at followup   > 50% of  this > 25 min visit spent in face to face counseling or coordination of care - by this undersigned MD - Dr Brand Males. This includes one or more of the following documented above: discussion of test results, diagnostic or treatment recommendations, prognosis, risks and benefits of management options, instructions, education, compliance or risk-factor reduction   SIGNATURE    Dr. Brand Males, M.D., F.C.C.P,  Pulmonary and Critical Care Medicine Staff Physician, [place] Director - Interstitial Lung Disease  Program  Pulmonary [place] at [place], [place], [address]  Pager: [phone], If no answer or between  15:00h - 7:00h: call [phone] Telephone: [phone]  12:34 PM 04/08/2019

## 2019-04-12 ENCOUNTER — Encounter (HOSPITAL_COMMUNITY): Payer: Self-pay

## 2019-04-12 ENCOUNTER — Other Ambulatory Visit: Payer: Self-pay

## 2019-04-12 ENCOUNTER — Ambulatory Visit (HOSPITAL_COMMUNITY)
Admission: RE | Admit: 2019-04-12 | Discharge: 2019-04-12 | Disposition: A | Discharge status: Home / Self Care | Payer: Medicare HMO | Source: Ambulatory Visit | Attending: Internal Medicine | Admitting: Internal Medicine

## 2019-04-12 DIAGNOSIS — J849 Interstitial pulmonary disease, unspecified: Secondary | ICD-10-CM | POA: Diagnosis present

## 2019-04-12 IMAGING — CT CT CHEST HIGH RESOLUTION W/O CM
2 of 6 series · 15 of 36 positions shown, 18 images · non-contrast
Comparison: High-resolution chest CT [DATE].

CLINICAL DATA: 63-year-old female with history of rectal cancer
diagnosed in [KM]. Cough and shortness of breath on exertion for 1
year. Evaluate for interstitial lung disease.

EXAM:
CT CHEST WITHOUT CONTRAST
TECHNIQUE: Multidetector CT imaging of the chest was performed following the
standard protocol without intravenous contrast. High resolution
imaging of the lungs, as well as inspiratory and expiratory imaging,
was performed.

[Series 2: thorax · axial · 0.71mm/px · z∈[-314,-60]mm · 12 of 151 slices shown, 15 images]
[im 12/151  mediastinal]
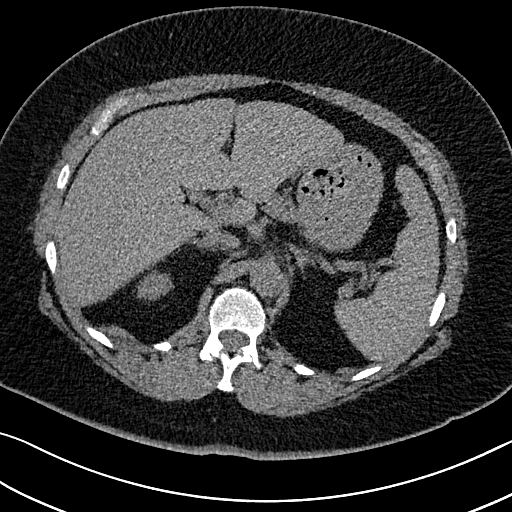
[im 12/151  lung]
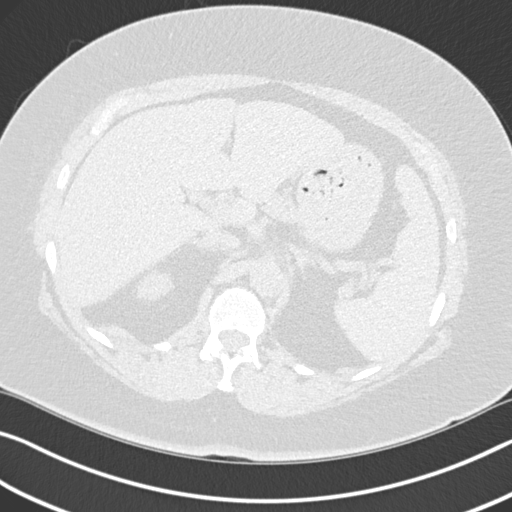
[im 24/151  lung]
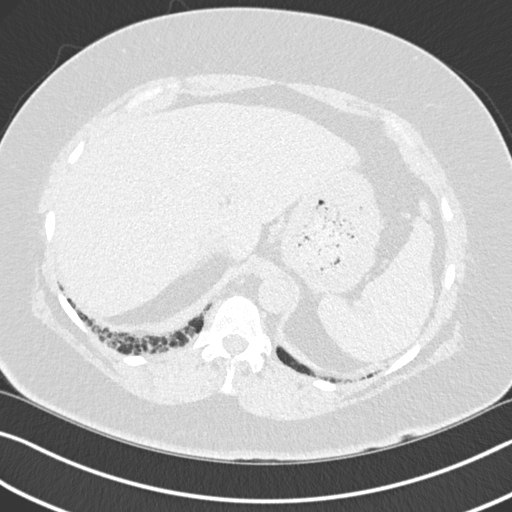
[im 35/151  lung]
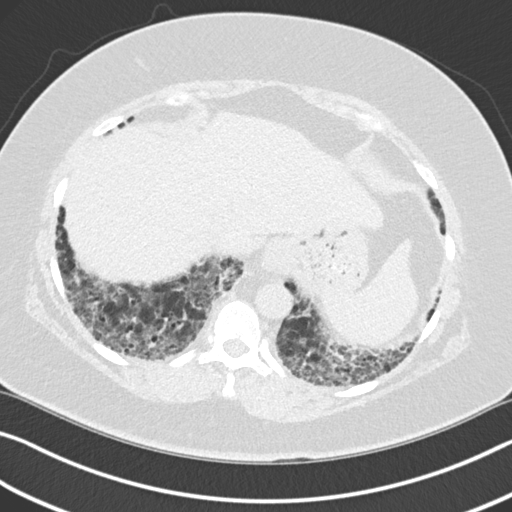
[im 47/151  lung]
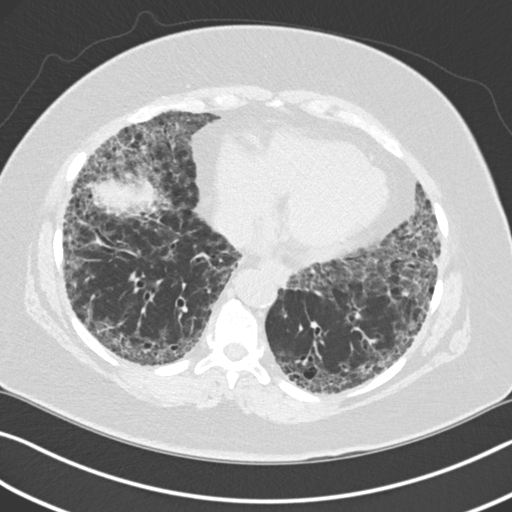
[im 58/151  mediastinal]
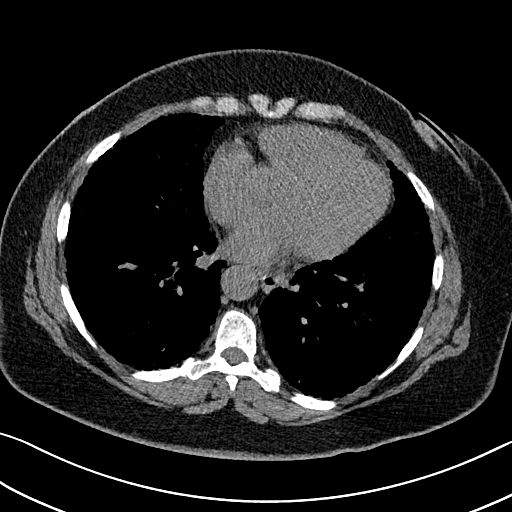
[im 58/151  lung]
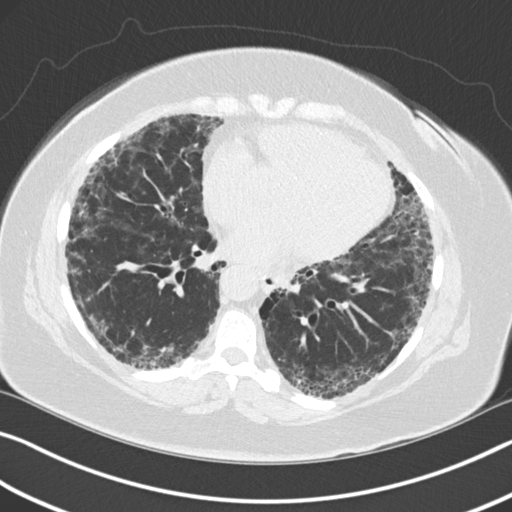
[im 70/151  lung]
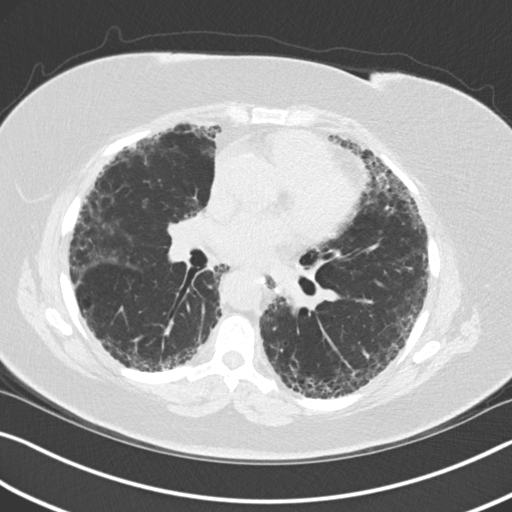
[im 81/151  lung]
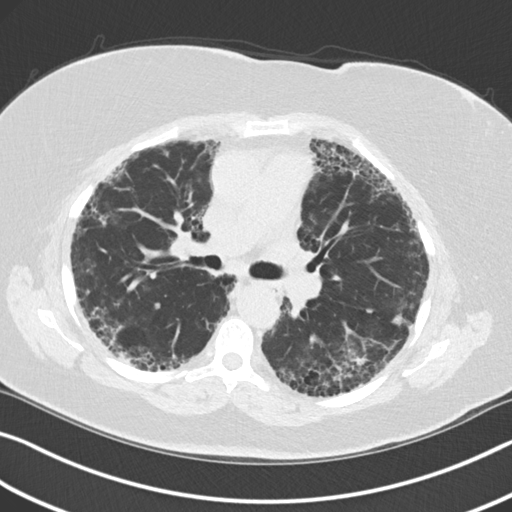
[im 93/151  lung]
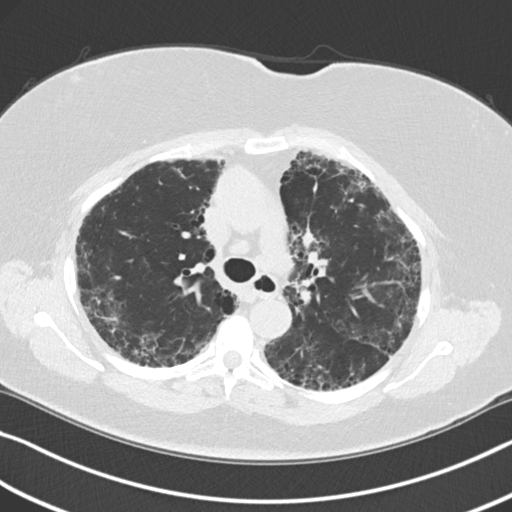
[im 104/151  mediastinal]
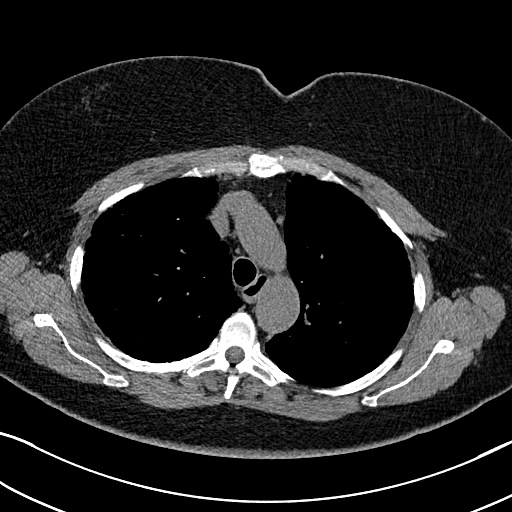
[im 104/151  lung]
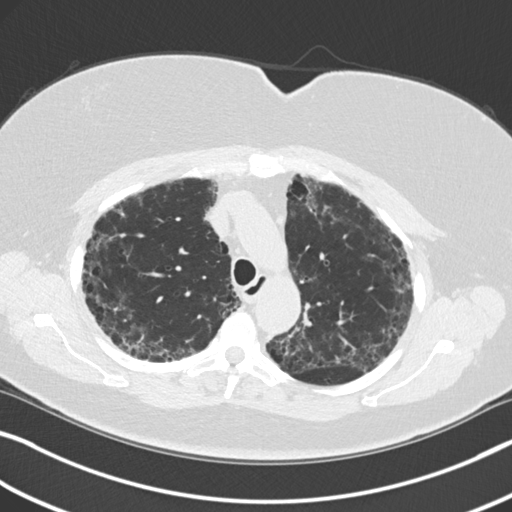
[im 116/151  lung]
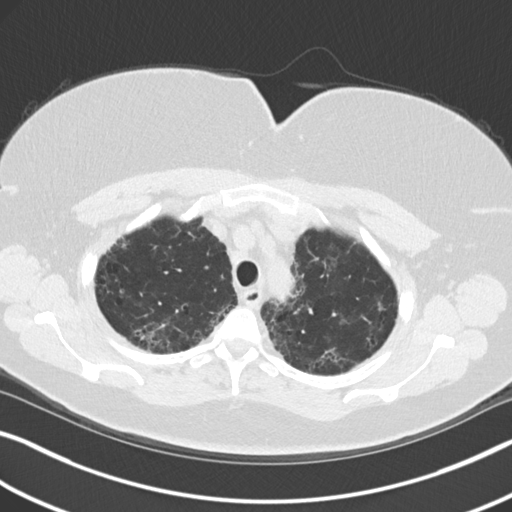
[im 127/151  lung]
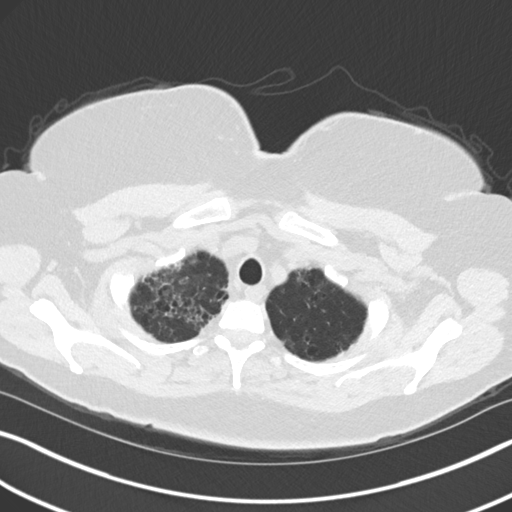
[im 139/151  lung]
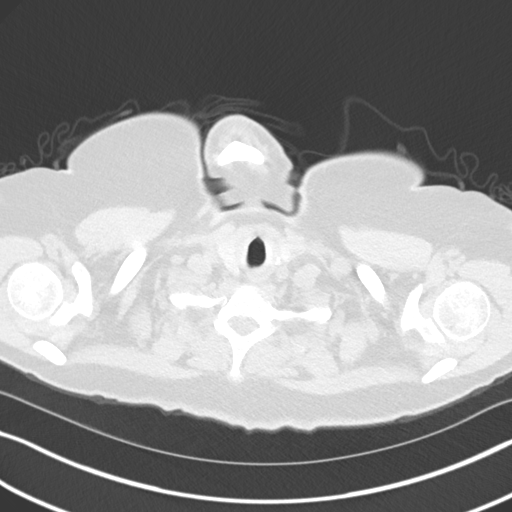

[Series 8: coronal · coronal · 0.67mm/px · 3 of 107 slices shown]
[im 22/107  lung]
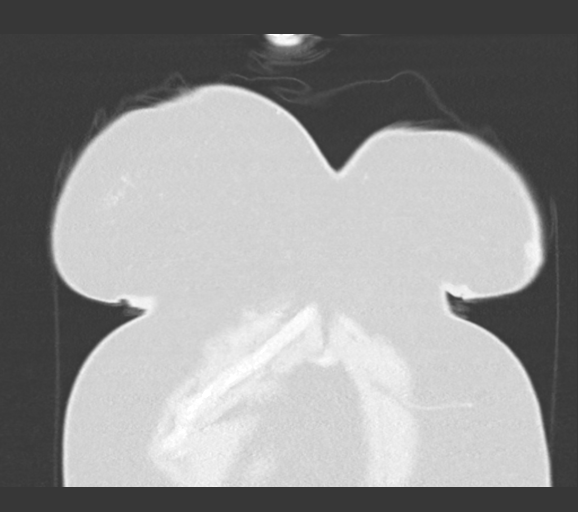
[im 43/107  lung]
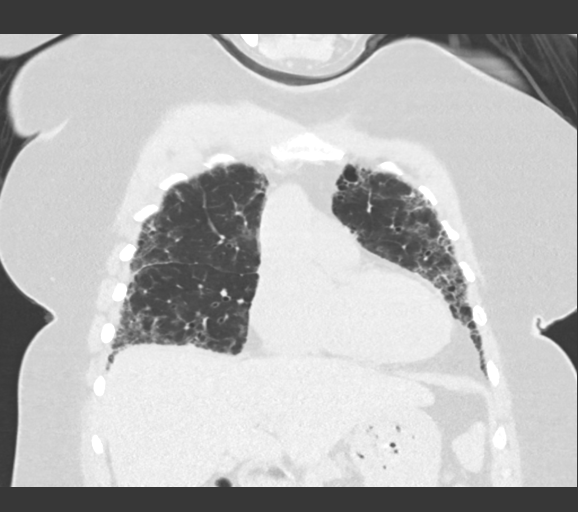
[im 64/107  lung]
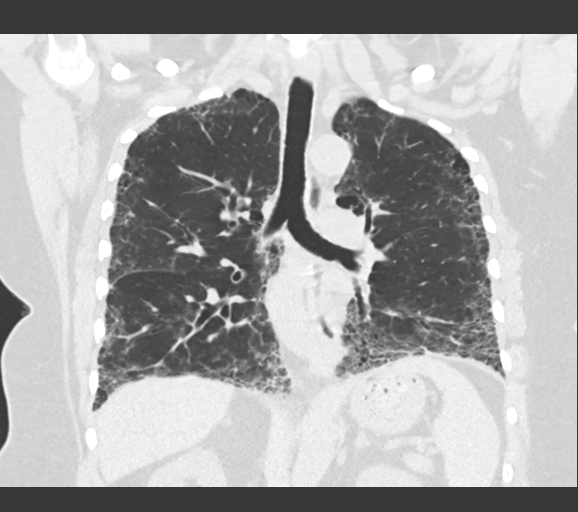

[15 of 36 positions shown; findings below may reference images not displayed]

FINDINGS: Cardiovascular: Heart size is normal. There is no significant
pericardial fluid, thickening or pericardial calcification. There is
aortic atherosclerosis, as well as atherosclerosis of the great
vessels of the mediastinum and the coronary arteries, including
calcified atherosclerotic plaque in the left anterior descending
coronary arteries.

Mediastinum/Nodes: No pathologically enlarged mediastinal or hilar
lymph nodes. Please note that accurate exclusion of hilar adenopathy
is limited on noncontrast CT scans. Esophagus is unremarkable in
appearance. No axillary lymphadenopathy.

Lungs/Pleura: High-resolution images demonstrate widespread patchy
areas of ground-glass attenuation, septal thickening, thickening of
the peribronchovascular interstitium, cylindrical bronchiectasis,
peripheral bronchiolectasis and areas of honeycombing. These
findings have a definitive craniocaudal gradient and arm minimally
progressive compared to the prior study. Inspiratory and expiratory
imaging is unremarkable. No acute consolidative airspace disease. No
pleural effusions. No suspicious appearing pulmonary nodules or
masses are noted.

Upper Abdomen: Partially calcified gallstone in the neck of the
gallbladder.

Musculoskeletal: There are no aggressive appearing lytic or blastic
lesions noted in the visualized portions of the skeleton.
IMPRESSION: 1. The appearance of the lungs remains compatible with interstitial
lung disease, with a spectrum of findings considered diagnostic of
usual interstitial pneumonia (UIP) per current ATS guidelines.
Minimal progression compared to the prior study.
2. Aortic atherosclerosis, in addition to left anterior descending
coronary artery disease. Please note that although the presence of
coronary artery calcium documents the presence of coronary artery
disease, the severity of this disease and any potential stenosis
cannot be assessed on this non-gated CT examination. Assessment for
potential risk factor modification, dietary therapy or pharmacologic
therapy may be warranted, if clinically indicated.
3. Cholelithiasis.

Aortic Atherosclerosis ([KM]-[KM]).

## 2019-04-26 ENCOUNTER — Other Ambulatory Visit: Payer: Self-pay

## 2019-04-26 ENCOUNTER — Ambulatory Visit (INDEPENDENT_AMBULATORY_CARE_PROVIDER_SITE_OTHER): Payer: Medicare HMO | Admitting: Pulmonary Disease

## 2019-04-26 ENCOUNTER — Encounter: Payer: Self-pay | Admitting: Pulmonary Disease

## 2019-04-26 ENCOUNTER — Telehealth: Payer: Self-pay | Admitting: Internal Medicine

## 2019-04-26 DIAGNOSIS — J849 Interstitial pulmonary disease, unspecified: Secondary | ICD-10-CM | POA: Diagnosis not present

## 2019-04-26 DIAGNOSIS — Z720 Tobacco use: Secondary | ICD-10-CM | POA: Diagnosis not present

## 2019-04-26 DIAGNOSIS — J439 Emphysema, unspecified: Secondary | ICD-10-CM | POA: Diagnosis not present

## 2019-04-26 MED ORDER — PREDNISONE 10 MG PO TABS: ORAL_TABLET | ORAL | 0 refills | Status: DC

## 2019-04-26 MED ORDER — AZITHROMYCIN 250 MG PO TABS: ORAL_TABLET | ORAL | 0 refills | Status: DC

## 2019-04-26 MED ORDER — SPIRIVA RESPIMAT 1.25 MCG/ACT IN AERS: 2.0000 | INHALATION_SPRAY | Freq: Every day | RESPIRATORY_TRACT | 0 refills | Status: DC

## 2019-04-26 NOTE — Telephone Encounter (Signed)
Primary Pulmonologist: Ramaswamy Last office visit and with whom: 04/08/19 w/ MR What do we see them for (pulmonary problems): ILD Last OV assessment/plan:  Plan:     Patient Instructions        ICD-10-CM    1. Interstitial lung disease ([place])  J84.9         - I am concerned you  have Interstitial Lung Disease (ILD) other wise called Pulmonary fibrosis - this is getting worse I I think  -  There are MANY varieties of this - the variety you likely have is one called IPF   - To narrow down possibilities and assess severity please do the following tests             - do spirometry and dlco test (no BD response, no DLCO; choose a location depending on schedule and convenience)             - do walking test on room air in the office (not 6 min walk test)             - do overnight oxygen test on room air             - do High Resolution CT chest wo contrast (only Dr Rosario Jacks or Dr Weber Cooks or Dr Polly Cobia to read)     Followup - next few to several weeks but after completing above             - pneumovax vaccine at followup    > 50% of this > 25 min visit spent in face to face counseling or coordination of care - by this undersigned MD - Dr Brand Males. This includes one or more of the following documented above: discussion of test results, diagnostic or treatment recommendations, prognosis, risks and benefits of management options, instructions, education, compliance or risk-factor reduction   Was appointment offered to patient (explain)?  Patient is scheduled for televisit with Mack NP for today 04/26/19 @ 1630 (pt is not at home to do this earlier and needs the help of her son).  Patient unable to do video as she does not have MyChart and did not want to sign up for this without son present.  Reason for call: called spoke with patient who reports prod cough with 'gold' mucus, wheezing, increased SHOB x2 weeks.  Denies any hemoptysis, head congestion, PND, tightness, chest pain, sick  contacts.  Would also like 10.30.2020 HRCT results.  ONO has not yet been performed.  PFT and follow up visit scheduled for 12.30.2020.

## 2019-04-26 NOTE — Assessment & Plan Note (Signed)
Continue to not smoke

## 2019-04-26 NOTE — Assessment & Plan Note (Signed)
Likely COPD exacerbation today Patient is all out of Spiriva Respimat 1.25  Plan: Z-Pak today Prednisone taper today Continue Symbicort 160 Continue Spiriva Respimat 1.25, samples provided today Patient likely would be a good candidate for breztri, this can be considered if we have samples in December/2020 office visit after pulmonary function testing Explained that patient needs to contact her office if symptoms are worsening

## 2019-04-26 NOTE — Assessment & Plan Note (Signed)
Likely IPF So far negative connective tissue work-up October/2020 high-resolution CT chest diagnostic for UIP  Plan: At next office visit after pulmonary function testing discussed potential need for antifibrotic's if we are noticing pulmonary function testing worsening.  CT scan in October/2020 shows minimal progression.

## 2019-04-26 NOTE — Telephone Encounter (Signed)
OK noted. Happy to speak with her.   Aaron Edelman

## 2019-04-26 NOTE — Patient Instructions (Signed)
You were seen today by Lauraine Rinne, NP  for:   1. Interstitial lung disease ([place])  Complete pulmonary function testing as scheduled with EW NP in December/2020  2. Pulmonary emphysema, unspecified emphysema type ([place])  - azithromycin (ZITHROMAX) 250 MG tablet; 541m (two tablets) today, then 2524m(1 tablet) for the next 4 days  Dispense: 6 tablet; Refill: 0 - predniSONE (DELTASONE) 10 MG tablet; Take 2 tablets (2060motal) daily for the next 5 days. Take in the AM with food.  Dispense: 10 tablet; Refill: 0  Symbicort 160 >>> 2 puffs in the morning right when you wake up, rinse out your mouth after use, 12 hours later 2 puffs, rinse after use >>> Take this daily, no matter what >>> This is not a rescue inhaler   Spiriva Respimat 1.25 >>> 2 puffs daily >>> Do this every day >>>This is not a rescue inhaler  Only use your albuterol as a rescue medication to be used if you can't catch your breath by resting or doing a relaxed purse lip breathing pattern.  - The less you use it, the better it will work when you need it. - Ok to use up to 2 puffs  every 4 hours if you must but call for immediate appointment if use goes up over your usual need - Don't leave home without it !!  (think of it like the spare tire for your car)   Note your daily symptoms > remember "red flags" for COPD:   >>>Increase in cough >>>increase in sputum production >>>increase in shortness of breath or activity  intolerance.   If you notice these symptoms, please call the office to be seen.    3.  Former tobacco user  Continue to not smoke   We recommend today:  No orders of the defined types were placed in this encounter.  No orders of the defined types were placed in this encounter.  Meds ordered this encounter  Medications  . azithromycin (ZITHROMAX) 250 MG tablet    Sig: 500m45mwo tablets) today, then 250mg38mtablet) for the next 4 days    Dispense:  6 tablet    Refill:  0  . predniSONE  (DELTASONE) 10 MG tablet    Sig: Take 2 tablets (20mg 21ml) daily for the next 5 days. Take in the AM with food.    Dispense:  10 tablet    Refill:  0  . Tiotropium Bromide Monohydrate (SPIRIVA RESPIMAT) 1.25 MCG/ACT AERS    Sig: Inhale 2 puffs into the lungs daily.    Dispense:  24 g    Refill:  0    Order Specific Question:   Lot Number?    Answer:   [id number] Specific Question:   Quantity    Answer:   6    Follow Up:    Return in about 6 weeks (around 06/07/2019), or if symptoms worsen or fail to improve, for Follow up with Beth WDerl BarrowBC, Follow up for PFT.   Please do your part to reduce the spread of COVID-19:      Reduce your risk of any infection  and COVID19 by using the similar precautions used for avoiding the common cold or flu:  . WashMarland Kitchenyour hands often with soap and warm water for at least 20 seconds.  If soap and water are not readily available, use an alcohol-based hand sanitizer with at least 60% alcohol.  . If coughing or sneezing,  cover your mouth and nose by coughing or sneezing into the elbow areas of your shirt or coat, into a tissue or into your sleeve (not your hands). Langley Gauss A MASK when in public  . Avoid shaking hands with others and consider head nods or verbal greetings only. . Avoid touching your eyes, nose, or mouth with unwashed hands.  . Avoid close contact with people who are sick. . Avoid places or events with large numbers of people in one location, like concerts or sporting events. . If you have some symptoms but not all symptoms, continue to monitor at home and seek medical attention if your symptoms worsen. . If you are having a medical emergency, call 911.   [place] / e-Visit: [email or web address]         [facility]: [facility]: [phone]                   [facility]: [phone]     It is flu season:   >>> Best ways to protect herself from the flu: Receive the yearly flu vaccine, practice good hand hygiene washing with soap and also using hand sanitizer when available, eat a nutritious meals, get adequate rest, hydrate appropriately   Please contact the office if your symptoms worsen or you have concerns that you are not improving.   Thank you for choosing [facility] for your healthcare, and for allowing [place] to partner with you on your healthcare journey. I am thankful to be able to provide care to you today.   Wyn Quaker FNP-C

## 2019-04-26 NOTE — Progress Notes (Signed)
Virtual Visit via Telephone Note  I connected with Erica Erica [name] on 04/26/19 at  4:30 PM EST by telephone and verified that I am speaking with the correct person using two identifiers.  Location: Patient: Home Provider: [facility] - [address], [place], [place],  [address]   I discussed the limitations, risks, security and privacy concerns of performing an evaluation and management service by telephone and the availability of in person appointments. I also discussed with the patient that there may be a patient responsible charge related to this service. The patient expressed understanding and agreed to proceed.  Patient consented to consult via telephone: Yes People present and their role in pt care: Pt   History of Present Illness:  63 year old Erica [name] former smoker followed in our office for COPD and interstitial lung disease  Past medical history: Rectal cancer, GERD Smoking history: Former smoker, quit 2019, 10-pack-year smoking history Maintenance: Symbicort 160, Spiriva Respimat 1.25 Patient of Dr. Chase Caller  Chief complaint: Cough, congestion, wheezing  Patient was last seen in our office on 04/08/2019.  It was felt at that office visit that her interstitial lung disease or pulmonary fibrosis may have been getting worse.  It was recommended that she complete a breathing test, walking test, overnight oximetry as well as a high-resolution CT chest.  63 year old Erica [name] former smoker contacted our office on 04/26/2019 due to acute worsening symptoms.  Patient reporting that patient contacted our office reporting she is had a productive cough with colored mucus, increased wheezing as well as increased shortness of breath for the last 2 weeks.  Patient denied hemoptysis, postnasal drip, tightness or chest pain.  She also denied recent sick contacts.  Patient reporting that mucus production increased 2 weeks ago.  She has had increased mucus production as well as  mucus color changed to yellow gold mucus.  At baseline she typically does not have a cough and does not produce mucus.  Over the last couple days she is also noticed increased wheezing.  She continues to be adherent to Symbicort 160.  Unfortunately she ran out of Spiriva Respimat 1 week ago.  She did not contact our office.  She is wondering if we have any samples.  Patient did complete high-resolution CT chest.  Patient is scheduled on 06/12/2019 with a EW NP as well as pulmonary function testing.  Observations/Objective:  04/12/2019-high-resolution CT chest-appearance of the lungs remains compatible with interstitial lung disease consider diagnostic for UIP, minimal progression compared to prior study, aortic arthrosclerosis  08/11/2016-pulmonary function test-FVC 2.56 (98% predicted), postbronchodilator ratio 67, postbronchodilator FEV1 2.01 (98% predicted), positive bronchodilator response, DLCO 14.81 (61% predicted)   Ref. Range 08/12/2016 14:23 09/07/2016 11:20  Anit Nuclear Antibody(ANA) Latest Ref Range: NEGATIVE  NEG   ANCA Proteinase 3 Latest Ref Range: 0.0 - 3.5 U/mL  <3.5  Anti JO-1 Latest Ref Range: 0.0 - 0.9 AI <5.7 <8.4  Cyclic Citrullin Peptide Ab Latest Units: Units <16   ds DNA Ab Latest Units: IU/mL <1   Myeloperoxidase Ab Latest Ref Range: 0.0 - 9.0 U/mL  <9.0  Cytoplasmic (C-ANCA) Latest Ref Range: <1:20  1:40 (H)   Scleroderma (Scl-70) (ENA) Antibody, IgG Latest Ref Range: <1.0 NEG AI  <1.0 NEG    SIX MIN WALK 04/08/2019 10/24/2017  Supplimental Oxygen during Test? (L/min) No No  Tech Comments: pt walked a moderate pace, tolerated walk well. Fast paced walk  no sob noted.     Assessment and Plan:  Interstitial lung  disease ([place]) Likely IPF So far negative connective tissue work-up October/2020 high-resolution CT chest diagnostic for UIP  Plan: At next office visit after pulmonary function testing discussed potential need for antifibrotic's if we are  noticing pulmonary function testing worsening.  CT scan in October/2020 shows minimal progression.  COPD (chronic obstructive pulmonary disease) ([place]) Likely COPD exacerbation today Patient is all out of Spiriva Respimat 1.25  Plan: Z-Pak today Prednisone taper today Continue Symbicort 160 Continue Spiriva Respimat 1.25, samples provided today Patient likely would be a good candidate for breztri, this can be considered if we have samples in December/2020 office visit after pulmonary function testing Explained that patient needs to contact her office if symptoms are worsening   Tobacco user Continue to not smoke   Follow Up Instructions:  Return in about 6 weeks (around 06/07/2019), or if symptoms worsen or fail to improve, for Follow up with Derl Barrow AGNP-BC, Follow up for PFT.   I discussed the assessment and treatment plan with the patient. The patient was provided an opportunity to ask questions and all were answered. The patient agreed with the plan and demonstrated an understanding of the instructions.   The patient was advised to call back or seek an in-person evaluation if the symptoms worsen or if the condition fails to improve as anticipated.  I provided 24 minutes of non-face-to-face time during this encounter.   Lauraine Rinne, NP

## 2019-05-09 ENCOUNTER — Encounter (HOSPITAL_COMMUNITY): Payer: Self-pay | Admitting: Family Medicine

## 2019-05-09 ENCOUNTER — Emergency Department (HOSPITAL_COMMUNITY)
Admission: EM | Admit: 2019-05-09 | Discharge: 2019-05-14 | Disposition: A | Discharge status: Other Acute Inpatient Hospital | Payer: Medicare HMO | Attending: Emergency Medicine | Admitting: Emergency Medicine

## 2019-05-09 ENCOUNTER — Other Ambulatory Visit: Payer: Self-pay

## 2019-05-09 ENCOUNTER — Inpatient Hospital Stay: Admission: RE | Admit: 2019-05-09 | Payer: Self-pay | Source: Intra-hospital | Admitting: Psychiatry

## 2019-05-09 DIAGNOSIS — Z79899 Other long term (current) drug therapy: Secondary | ICD-10-CM | POA: Diagnosis not present

## 2019-05-09 DIAGNOSIS — F1410 Cocaine abuse, uncomplicated: Secondary | ICD-10-CM | POA: Diagnosis not present

## 2019-05-09 DIAGNOSIS — J45909 Unspecified asthma, uncomplicated: Secondary | ICD-10-CM | POA: Diagnosis not present

## 2019-05-09 DIAGNOSIS — E119 Type 2 diabetes mellitus without complications: Secondary | ICD-10-CM | POA: Insufficient documentation

## 2019-05-09 DIAGNOSIS — F332 Major depressive disorder, recurrent severe without psychotic features: Secondary | ICD-10-CM | POA: Diagnosis not present

## 2019-05-09 DIAGNOSIS — Z20828 Contact with and (suspected) exposure to other viral communicable diseases: Secondary | ICD-10-CM | POA: Diagnosis not present

## 2019-05-09 DIAGNOSIS — F1010 Alcohol abuse, uncomplicated: Secondary | ICD-10-CM | POA: Diagnosis not present

## 2019-05-09 DIAGNOSIS — I10 Essential (primary) hypertension: Secondary | ICD-10-CM | POA: Diagnosis not present

## 2019-05-09 DIAGNOSIS — T50904A Poisoning by unspecified drugs, medicaments and biological substances, undetermined, initial encounter: Secondary | ICD-10-CM | POA: Diagnosis present

## 2019-05-09 DIAGNOSIS — F322 Major depressive disorder, single episode, severe without psychotic features: Secondary | ICD-10-CM

## 2019-05-09 DIAGNOSIS — Z87891 Personal history of nicotine dependence: Secondary | ICD-10-CM | POA: Insufficient documentation

## 2019-05-09 DIAGNOSIS — T1491XA Suicide attempt, initial encounter: Secondary | ICD-10-CM

## 2019-05-09 LAB — COMPREHENSIVE METABOLIC PANEL
ALT: 16 U/L (ref 0–44)
AST: 31 U/L (ref 15–41)
Albumin: 3.3 g/dL — ABNORMAL LOW (ref 3.5–5.0)
Alkaline Phosphatase: 132 U/L — ABNORMAL HIGH (ref 38–126)
Anion gap: 12 (ref 5–15)
BUN: 32 mg/dL — ABNORMAL HIGH (ref 8–23)
CO2: 17 mmol/L — ABNORMAL LOW (ref 22–32)
Calcium: 8.5 mg/dL — ABNORMAL LOW (ref 8.9–10.3)
Chloride: 101 mmol/L (ref 98–111)
Creatinine, Ser: 1.51 mg/dL — ABNORMAL HIGH (ref 0.44–1.00)
GFR calc Af Amer: 42 mL/min — ABNORMAL LOW (ref >60)
GFR calc non Af Amer: 36 mL/min — ABNORMAL LOW (ref >60)
Glucose, Bld: 494 mg/dL — ABNORMAL HIGH (ref 70–99)
Potassium: 4.3 mmol/L (ref 3.5–5.1)
Sodium: 130 mmol/L — ABNORMAL LOW (ref 135–145)
Total Bilirubin: 1.2 mg/dL (ref 0.3–1.2)
Total Protein: 8 g/dL (ref 6.5–8.1)

## 2019-05-09 LAB — CBC
HCT: 32.7 % — ABNORMAL LOW (ref 36.0–46.0)
Hemoglobin: 11.5 g/dL — ABNORMAL LOW (ref 12.0–15.0)
MCH: 32.5 pg (ref 26.0–34.0)
MCHC: 35.2 g/dL (ref 30.0–36.0)
MCV: 92.4 fL (ref 80.0–100.0)
Platelets: 181 10*3/uL (ref 150–400)
RBC: 3.54 MIL/uL — ABNORMAL LOW (ref 3.87–5.11)
RDW: 15.5 % (ref 11.5–15.5)
WBC: 6.6 10*3/uL (ref 4.0–10.5)
nRBC: 0 % (ref 0.0–0.2)

## 2019-05-09 LAB — SALICYLATE LEVEL: Salicylate Lvl: <7 mg/dL (ref 2.8–30.0)

## 2019-05-09 LAB — RAPID URINE DRUG SCREEN, HOSP PERFORMED
Amphetamines: NOT DETECTED
Barbiturates: NOT DETECTED
Benzodiazepines: NOT DETECTED
Cocaine: POSITIVE — AB
Opiates: NOT DETECTED
Tetrahydrocannabinol: NOT DETECTED

## 2019-05-09 LAB — ACETAMINOPHEN LEVEL: Acetaminophen (Tylenol), Serum: <10 ug/mL — ABNORMAL LOW (ref 10–30)

## 2019-05-09 LAB — SARS CORONAVIRUS 2 (TAT 6-24 HRS): SARS Coronavirus 2: NEGATIVE

## 2019-05-09 LAB — CBG MONITORING, ED
Glucose-Capillary: 436 mg/dL — ABNORMAL HIGH (ref 70–99)
Glucose-Capillary: 382 mg/dL — ABNORMAL HIGH (ref 70–99)
Glucose-Capillary: 362 mg/dL — ABNORMAL HIGH (ref 70–99)

## 2019-05-09 LAB — ETHANOL: Alcohol, Ethyl (B): 21 mg/dL — ABNORMAL HIGH (ref <10)

## 2019-05-09 MED ORDER — HYDROCHLOROTHIAZIDE 12.5 MG PO CAPS
12.5000 mg | ORAL_CAPSULE | Freq: Every day | ORAL | Status: DC
  Administered 2019-05-09 – 2019-05-14 (×6): 12.5 mg via ORAL
  Filled 2019-05-09 (×6): qty 1

## 2019-05-09 MED ORDER — NORTRIPTYLINE HCL 25 MG PO CAPS
25.0000 mg | ORAL_CAPSULE | Freq: Every evening | ORAL | Status: DC | PRN
  Administered 2019-05-14: 25 mg via ORAL
  Filled 2019-05-09 (×3): qty 1

## 2019-05-09 MED ORDER — GABAPENTIN 300 MG PO CAPS
300.0000 mg | ORAL_CAPSULE | Freq: Three times a day (TID) | ORAL | Status: DC
  Administered 2019-05-09 – 2019-05-14 (×16): 300 mg via ORAL
  Filled 2019-05-09 (×17): qty 1

## 2019-05-09 MED ORDER — INSULIN GLARGINE 100 UNIT/ML ~~LOC~~ SOLN
100.0000 [IU] | Freq: Every day | SUBCUTANEOUS | Status: DC
  Administered 2019-05-09 – 2019-05-13 (×5): 100 [IU] via SUBCUTANEOUS
  Filled 2019-05-09 (×6): qty 1

## 2019-05-09 MED ORDER — SODIUM CHLORIDE 0.9 % IV BOLUS
1000.0000 mL | Freq: Once | INTRAVENOUS | Status: AC
  Administered 2019-05-09: 1000 mL via INTRAVENOUS

## 2019-05-09 MED ORDER — PANTOPRAZOLE SODIUM 40 MG PO TBEC
40.0000 mg | DELAYED_RELEASE_TABLET | Freq: Every day | ORAL | Status: DC
  Administered 2019-05-09 – 2019-05-14 (×6): 40 mg via ORAL
  Filled 2019-05-09 (×6): qty 1

## 2019-05-09 MED ORDER — ALBUTEROL SULFATE HFA 108 (90 BASE) MCG/ACT IN AERS
2.0000 | INHALATION_SPRAY | RESPIRATORY_TRACT | Status: DC | PRN
  Administered 2019-05-11 – 2019-05-13 (×3): 2 via RESPIRATORY_TRACT
  Filled 2019-05-09: qty 13.4

## 2019-05-09 MED ORDER — SIMVASTATIN 10 MG PO TABS
10.0000 mg | ORAL_TABLET | Freq: Every day | ORAL | Status: DC
  Administered 2019-05-09 – 2019-05-13 (×4): 10 mg via ORAL
  Filled 2019-05-09 (×5): qty 1

## 2019-05-09 MED ORDER — AMLODIPINE BESYLATE 5 MG PO TABS
10.0000 mg | ORAL_TABLET | Freq: Every day | ORAL | Status: DC
  Administered 2019-05-09 – 2019-05-14 (×6): 10 mg via ORAL
  Filled 2019-05-09 (×6): qty 2

## 2019-05-09 MED ORDER — LOSARTAN POTASSIUM 50 MG PO TABS
50.0000 mg | ORAL_TABLET | Freq: Every day | ORAL | Status: DC
  Administered 2019-05-09 – 2019-05-14 (×6): 50 mg via ORAL
  Filled 2019-05-09 (×6): qty 1

## 2019-05-09 MED ORDER — LOSARTAN POTASSIUM-HCTZ 50-12.5 MG PO TABS: 1.0000 | ORAL_TABLET | Freq: Every day | ORAL | Status: DC

## 2019-05-09 NOTE — ED Notes (Signed)
Informed Dr. Maryan Rued that pt needs something for CBG =385. Takes Toujeo at home.

## 2019-05-09 NOTE — ED Notes (Signed)
Pt ambulated to restroom x2 assist.

## 2019-05-09 NOTE — Consult Note (Signed)
  [place], 63 y.o., Yr. old female patient at [facility] assessed via tele psych by this provider and Dr. Tamsen Meek chart reviewed on 05/09/19.     On evaluation HALEEMA VANDERHEYDEN reports she is at the hospital because she took too many pills.  Patient states that the overdose was intentional reporting her stressor is having a lot of things happening but would not elaborate on what was happening with her.  Patient reports that she lives alone would not give any information about any family.  Patient also reports that she has a history of prior suicide attempt and 1 psychiatric hospitalization.  Reports she is taking on psychotropic medication that is prescribed to her by her PCP but cannot remember the name of it.  Patient admits to cocaine use with no further elaboration.   Recommendations: Inpatient psychiatric treatment  Disposition:   Recommend psychiatric Inpatient admission when medically cleared.   Earleen Newport, NP

## 2019-05-09 NOTE — ED Notes (Signed)
In bed resting. Took po meds and is cooperative with staff. Able to do ADLs.

## 2019-05-09 NOTE — Progress Notes (Signed)
Received Erica [name] awake in her bed at the change of shift watching TV with the sitter at the bedside. Later she was OOB to the bathroom without incident. She continues to have thoughts about suicide. She was compliant with her HS medications and insulin.  She slept throughout the night.

## 2019-05-09 NOTE — BH Assessment (Addendum)
Assessment Note  Erica [name] is an 63 y.o. female that presents this date with IVC after intentionally overdosing. Per IVC patient has been depressed over the last week and overdosed on her medical medications prior to arrival. Patient is observed to be very drowsy and renders limited history as this Probation officer attempts to assess. Patient's memory is noted to be recent impaired and patient is oriented x 2. Patient speaks in a low soft voice that is inaudible at times. Patient admits to ongoing thoughts of self harm and stated she "took a handful of muscle relaxers" in an attempt to end her life. Patient denies any H/I or AVH. Patient reports one prior attempt at self harm over 30 years ago and cannot recall exact time frame or where she was hospitalized. Patient states she was diagnosed with depression "years ago" and currently receiving OP care from [facility] who assists with medication management. Patient reports current medication compliance although reports worsening symptoms of depression over the last week to include: feeling hopeless and isolating. Patient states she currently resides alone although reports her son has recently moved back in with her to assist with ongoing care. Patient reports current stressors to include ongoing concerns with the COVID pandemic and lack of family support. Patient's UDS was positive for cocaine and BAL was noted to be 21. Patient states she has been using  crack cocaine once or twice a week for the last six months with last use on 05/08/19 when she reported using 2 grams. Patient also reports periodic alcohol use stating she "drinks a couple beers once in a while" with last use on 05/08/19 when she reported drinking one 40 once beer. Patient denies any current withdrawals. Patient denies any history of abuse or current legal charges. Patient is observed to be drowsy at the time of assessment and displays active thought blocking. Patient denies any H/I or AVH although  reports ongoing thoughts of self harm. Patient does not appear to be responding to internal stimuli. Patient denies access to weapons.  Patient'smood was depressed and judgement is impaired. Patient'sconcentration was poor. Patient'sinsight and impulse control are poor. Case was staffed with Rankin NP who recommended inpatient admission.   Diagnosis: F33.2 MDD recurrent without psychotic features, severe, Cocaine use, Alcohol abuse  Past Medical History:  Past Medical History:  Diagnosis Date  . Arthritis    especially in shoulders  . Asthma   . Depression   . Diabetes mellitus   . GERD (gastroesophageal reflux disease)   . Headache(784.0)    "mild"  . Hypertension   . Mental disorder    depression  . Neuropathy    feet   . Pain    arthritis pain - takes tramadol as needed  . Rectal polyp    very little bleeding with bowel movements- no pain    Past Surgical History:  Procedure Laterality Date  . ANAL RECTAL MANOMETRY N/A 07/13/2016   Procedure: ANO RECTAL MANOMETRY;  Surgeon: Leighton Ruff, MD;  [facility];  Service: Endoscopy;  Laterality: N/A;  . CESAREAN SECTION    . EUS N/A 11/21/2012   Procedure: LOWER ENDOSCOPIC ULTRASOUND (EUS);  Surgeon: Arta Silence, MD;  Location: [facility];  Service: Endoscopy;  Laterality: N/A;  . FLEXIBLE SIGMOIDOSCOPY N/A 11/21/2012   Procedure: FLEXIBLE SIGMOIDOSCOPY;  Surgeon: Arta Silence, MD;  [facility];  Service: Endoscopy;  Laterality: N/A;  . FLEXIBLE SIGMOIDOSCOPY N/A 01/28/2013   Procedure: FLEXIBLE SIGMOIDOSCOPY;  Surgeon: Leighton Ruff, MD;  Location:  [facility];  Service: Endoscopy;  Laterality: N/A;  . LAPAROSCOPIC LOW ANTERIOR RESECTION N/A 01/29/2013   Procedure: LAPAROSCOPIC LOW ANTERIOR RESECTION, Rigid Proctoscopy;  Surgeon: Leighton Ruff, MD;  [facility];  Service: General;  Laterality: N/A;  . LAPAROSCOPIC SIGMOID COLECTOMY N/A 11/14/2012   Procedure: DIAGNOSTIC LAPAROSCOPY AND  SIGMOIDMOIDOSCOPY ;  Surgeon: Rolm Bookbinder, MD;  [facility];  Service: General;  Laterality: N/A;  . RECTAL ULTRASOUND N/A 07/13/2016   Procedure: RECTAL ULTRASOUND;  Surgeon: Leighton Ruff, MD;  [facility];  Service: Endoscopy;  Laterality: N/A;  . TONSILLECTOMY    . TONSILLECTOMY AND ADENOIDECTOMY      Family History:  Family History  Problem Relation Age of Onset  . Hypertension Father   . Stroke Father   . Diabetes Father   . Heart disease Mother   . Hypertension Mother   . Hyperlipidemia Mother   . Hypertension Sister   . Hypertension Brother   . Hypertension Sister   . Hypertension Brother   . Cancer Other 47       colon cancer   . Cancer Maternal Aunt        cancer, unknown type   . Cancer Maternal Uncle        bone cancer   . Cancer Paternal Aunt        lung cancer  . Cancer Paternal Uncle        lung cancer  . Cancer Maternal Uncle        colon cancer and prostate cancer   . Cancer Maternal Uncle        prostate cancer     Social History:  reports that she quit smoking about 16 months ago. Her smoking use included cigarettes. She has a 10.00 pack-year smoking history. She has never used smokeless tobacco. She reports current alcohol use. She reports current drug use. Drug: "Crack" cocaine.  Additional Social History:  Alcohol / Drug Use Pain Medications: See MAR Prescriptions: See MAR Over the Counter: See MAR History of alcohol / drug use?: Yes Longest period of sobriety (when/how long): Unknown Negative Consequences of Use: (Denies) Withdrawal Symptoms: (Denies) Substance #1 Name of Substance 1: Cocaine 1 - Age of First Use: 34 1 - Amount (size/oz): Varies 1 - Frequency: Varies 1 - Duration: Ongoing 1 - Last Use / Amount: 05/08/19 1 gram  CIWA: CIWA-Ar BP: (!) (P) 154/81 Pulse Rate: (P) 69 COWS:    Allergies:  Allergies  Allergen Reactions  . Penicillins Hives, Itching and Swelling    Tongue swells up Has patient had a  PCN reaction causing immediate rash, facial/tongue/throat swelling, SOB or lightheadedness with hypotension: Yes Has patient had a PCN reaction causing severe rash involving mucus membranes or skin necrosis: Yes Has patient had a PCN reaction that required hospitalization Yes Has patient had a PCN reaction occurring within the last 10 years: No If all of the above answers are "NO", then may proceed with Cephalosporin use.     Home Medications: (Not in a hospital admission)   OB/GYN Status:  No LMP recorded. Patient is postmenopausal.  General Assessment Data [facility]: [facility] TTS Assessment: In system Is this a Tele or Face-to-Face Assessment?: Face-to-Face Is this an Initial Assessment or a Re-assessment for this encounter?: Initial Assessment Patient Accompanied by:: N/A Language Other than English: No Living Arrangements: Other (Comment) What gender do you identify as?: Female Marital status: Single Living Arrangements: Other relatives Can pt return to current living arrangement?:  Yes Admission Status: Involuntary Petitioner: ED Attending Is patient capable of signing voluntary admission?: Yes Referral Source: Self/Family/Friend Insurance type: Medicare  Medical Screening Exam ([place]) Medical Exam completed: Yes  Crisis Care Plan Living Arrangements: Other relatives Legal Guardian: (NA) Name of Psychiatrist: [facility] Name of Therapist: None  Education Status Is patient currently in school?: No Is the patient employed, unemployed or receiving disability?: Unemployed  Risk to self with the past 6 months Suicidal Ideation: Yes-Currently Present Has patient been a risk to self within the past 6 months prior to admission? : No Suicidal Intent: Yes-Currently Present Has patient had any suicidal intent within the past 6 months prior to admission? : No Is patient at risk for suicide?: Yes Suicidal Plan?: Yes-Currently Present Has patient had  any suicidal plan within the past 6 months prior to admission? : No Specify Current Suicidal Plan: Overdose Access to Means: Yes Specify Access to Suicidal Means: Pt had medications What has been your use of drugs/alcohol within the last 12 months?: Current use Previous Attempts/Gestures: Yes How many times?: 1 Other Self Harm Risks: (Excessive SA use) Triggers for Past Attempts: Unknown Intentional Self Injurious Behavior: None Family Suicide History: No Recent stressful life event(s): Other (Comment)(Stress from [facility]) Persecutory voices/beliefs?: No Depression: Yes Depression Symptoms: (Pt is vague in reference to symptoms ) Substance abuse history and/or treatment for substance abuse?: No Suicide prevention information given to non-admitted patients: Not applicable  Risk to Others within the past 6 months Homicidal Ideation: No Does patient have any lifetime risk of violence toward others beyond the six months prior to admission? : No Thoughts of Harm to Others: No Current Homicidal Intent: No Current Homicidal Plan: No Access to Homicidal Means: No Identified Victim: NA History of harm to others?: No Assessment of Violence: None Noted Violent Behavior Description: NA Does patient have access to weapons?: No Criminal Charges Pending?: No Does patient have a court date: No Is patient on probation?: No  Psychosis Hallucinations: None noted Delusions: None noted  Mental Status Report Appearance/Hygiene: In scrubs Eye Contact: Fair Motor Activity: Freedom of movement Speech: Soft, Slow Level of Consciousness: Drowsy Mood: Depressed Affect: Appropriate to circumstance Anxiety Level: Minimal Thought Processes: Thought Blocking Judgement: Partial Orientation: Person, Place Obsessive Compulsive Thoughts/Behaviors: None  Cognitive Functioning Concentration: Decreased Memory: Recent Impaired Is patient IDD: No Insight: Fair Impulse Control: Poor Appetite:  Poor Have you had any weight changes? : No Change Sleep: Decreased Total Hours of Sleep: 3 Vegetative Symptoms: None  ADLScreening [facility] Assessment Services) Patient's cognitive ability adequate to safely complete daily activities?: Yes Patient able to express need for assistance with ADLs?: Yes Independently performs ADLs?: Yes (appropriate for developmental age)  Prior Inpatient Therapy Prior Inpatient Therapy: Yes Prior Therapy Dates: (Pt states 30 years ago) Prior Therapy Facilty/Provider(s): (Pt cannot recall) Reason for Treatment: MH issues  Prior Outpatient Therapy Prior Outpatient Therapy: Yes Prior Therapy Dates: Ongoing Prior Therapy Facilty/Provider(s): [facility] Reason for Treatment: Med mang Does patient have an ACCT team?: No Does patient have Intensive In-House Services?  : No Does patient have [facility] services? : No Does patient have P4CC services?: No  ADL Screening (condition at time of admission) Patient's cognitive ability adequate to safely complete daily activities?: Yes Is the patient deaf or have difficulty hearing?: No Does the patient have difficulty seeing, even when wearing glasses/contacts?: No Does the patient have difficulty concentrating, remembering, or making decisions?: No Patient able to express need for assistance with ADLs?: Yes  Does the patient have difficulty dressing or bathing?: No Independently performs ADLs?: Yes (appropriate for developmental age) Does the patient have difficulty walking or climbing stairs?: No Weakness of Legs: None Weakness of Arms/Hands: None  Home Assistive Devices/Equipment Home Assistive Devices/Equipment: None  Therapy Consults (therapy consults require a physician order) PT Evaluation Needed: No OT Evalulation Needed: No SLP Evaluation Needed: No Abuse/Neglect Assessment (Assessment to be complete while patient is alone) Abuse/Neglect Assessment Can Be Completed: Yes Physical Abuse: Denies Verbal  Abuse: Denies Sexual Abuse: Denies Exploitation of patient/patient's resources: Denies Self-Neglect: Denies Values / Beliefs Cultural Requests During Hospitalization: None Spiritual Requests During Hospitalization: None Consults Spiritual Care Consult Needed: No Social Work Consult Needed: No Regulatory affairs officer (For Healthcare) Does Patient Have a Medical Advance Directive?: No Would patient like information on creating a medical advance directive?: No - Patient declined          Disposition:  Disposition Initial Assessment Completed for this Encounter: Yes Disposition of Patient: Admit Type of inpatient treatment program: Adult Patient refused recommended treatment: No  On Site Evaluation by:   Reviewed with Physician:    Mamie Nick 05/09/2019 3:16 PM

## 2019-05-09 NOTE — ED Notes (Signed)
Spoke with [facility] who verify that the [facility] test is currently running and should be resulted around 4 pm.

## 2019-05-09 NOTE — BH Assessment (Signed)
[facility] Assessment Progress Note  Per Shuvon Rankin, FNP, this pt requires psychiatric hospitalization.  This Probation officer has been in communication with Ian Malkin, Counselor [phone]) at [facility] to refer pt.  As of this writing, negative Covid-19 test results have been returned for pt.  TTS assessment is in the process of being entered by Viviana Simpler, LCAS.  Pt is under IVC initiated by EDP Gerlene Fee, MD, and IVC documents have been faxed to [phone]  Pt's nurse, Diane [phone]), has been notified, and agrees to stay in communication with Kerry Dory to facilitate anticipated transfer.  Presuming pt it accepted to [facility], she is to be transported via [facility].  As of this writing, final disposition is pending.   Jalene Mullet, [place] Coordinator [phone]

## 2019-05-09 NOTE — ED Notes (Signed)
Kristi with Poison Control closed out pt's chart.  Cleared.

## 2019-05-09 NOTE — BH Assessment (Signed)
Patient has been accepted to [facility].  Accepting physician is Dr. Dwyane Dee.  Attending Physician will be Dr. Mallie Darting.  Patient has been assigned to room 324, by [facility].  Call report to [phone].  Representative/Transfer Coordinator is Dispensing optician Patient pre-admitted by [facility] Patient Access Gust Rung.)  [facility] ER Staff Marcello Moores H, TTS & Arnoldo Lenis, RN) made aware of acceptance.  Patient bed is available anytime after 8:30pm.

## 2019-05-09 NOTE — ED Provider Notes (Signed)
[facility] Emergency Department Provider Note MRN:  [id number]  Arrival date & time: 05/09/19     Chief Complaint   Drug Overdose   History of Present Illness   Erica [name] is a 63 y.o. year-old female with a history of depression, diabetes presenting to the ED with chief complaint of overdose.  Overdose on tramadol and reportedly alcohol as well.  Patient is somnolent and altered.  I was unable to obtain an accurate HPI, PMH, or ROS due to the patient's altered mental status.  Level 5 caveat.  Review of Systems  Positive for ingestion, altered mental status  Patient's Health History    Past Medical History:  Diagnosis Date  . Arthritis    especially in shoulders  . Asthma   . Depression   . Diabetes mellitus   . GERD (gastroesophageal reflux disease)   . Headache(784.0)    "mild"  . Hypertension   . Mental disorder    depression  . Neuropathy    feet   . Pain    arthritis pain - takes tramadol as needed  . Rectal polyp    very little bleeding with bowel movements- no pain    Past Surgical History:  Procedure Laterality Date  . ANAL RECTAL MANOMETRY N/A 07/13/2016   Procedure: ANO RECTAL MANOMETRY;  Surgeon: Leighton Ruff, MD;  [facility];  Service: Endoscopy;  Laterality: N/A;  . CESAREAN SECTION    . EUS N/A 11/21/2012   Procedure: LOWER ENDOSCOPIC ULTRASOUND (EUS);  Surgeon: Arta Silence, MD;  Location: [facility];  Service: Endoscopy;  Laterality: N/A;  . FLEXIBLE SIGMOIDOSCOPY N/A 11/21/2012   Procedure: FLEXIBLE SIGMOIDOSCOPY;  Surgeon: Arta Silence, MD;  [facility];  Service: Endoscopy;  Laterality: N/A;  . FLEXIBLE SIGMOIDOSCOPY N/A 01/28/2013   Procedure: FLEXIBLE SIGMOIDOSCOPY;  Surgeon: Leighton Ruff, MD;  [facility];  Service: Endoscopy;  Laterality: N/A;  . LAPAROSCOPIC LOW ANTERIOR RESECTION N/A 01/29/2013   Procedure: LAPAROSCOPIC LOW ANTERIOR RESECTION, Rigid Proctoscopy;  Surgeon:  Leighton Ruff, MD;  [facility];  Service: General;  Laterality: N/A;  . LAPAROSCOPIC SIGMOID COLECTOMY N/A 11/14/2012   Procedure: DIAGNOSTIC LAPAROSCOPY AND SIGMOIDMOIDOSCOPY ;  Surgeon: Rolm Bookbinder, MD;  Location[facility];  Service: General;  Laterality: N/A;  . RECTAL ULTRASOUND N/A 07/13/2016   Procedure: RECTAL ULTRASOUND;  Surgeon: Leighton Ruff, MD;  [facility];  Service: Endoscopy;  Laterality: N/A;  . TONSILLECTOMY    . TONSILLECTOMY AND ADENOIDECTOMY      Family History  Problem Relation Age of Onset  . Hypertension Father   . Stroke Father   . Diabetes Father   . Heart disease Mother   . Hypertension Mother   . Hyperlipidemia Mother   . Hypertension Sister   . Hypertension Brother   . Hypertension Sister   . Hypertension Brother   . Cancer Other 47       colon cancer   . Cancer Maternal Aunt        cancer, unknown type   . Cancer Maternal Uncle        bone cancer   . Cancer Paternal Aunt        lung cancer  . Cancer Paternal Uncle        lung cancer  . Cancer Maternal Uncle        colon cancer and prostate cancer   . Cancer Maternal Uncle        prostate cancer  Social History   Socioeconomic History  . Marital status: Single    Spouse name: Not on file  . Number of children: Not on file  . Years of education: Not on file  . Highest education level: Not on file  Occupational History  . Not on file  Social Needs  . Financial resource strain: Not on file  . Food insecurity    Worry: Not on file    Inability: Not on file  . Transportation needs    Medical: Not on file    Non-medical: Not on file  Tobacco Use  . Smoking status: Former Smoker    Packs/day: 0.50    Years: 20.00    Pack years: 10.00    Types: Cigarettes    Quit date: 01/05/2018    Years since quitting: 1.3  . Smokeless tobacco: Never Used  . Tobacco comment: Unknown   Substance and Sexual Activity  . Alcohol use: Yes    Comment: Unknown   . Drug use: Yes     Types: "Crack" cocaine    Comment: Unknown   . Sexual activity: Never    Birth control/protection: None  Lifestyle  . Physical activity    Days per week: Not on file    Minutes per session: Not on file  . Stress: Not on file  Relationships  . Social Herbalist on phone: Not on file    Gets together: Not on file    Attends religious service: Not on file    Active member of club or organization: Not on file    Attends meetings of clubs or organizations: Not on file    Relationship status: Not on file  . Intimate partner violence    Fear of current or ex partner: Not on file    Emotionally abused: Not on file    Physically abused: Not on file    Forced sexual activity: Not on file  Other Topics Concern  . Not on file  Social History Narrative  . Not on file     Physical Exam  Vital Signs and Nursing Notes reviewed Vitals:   05/09/19 0514 05/09/19 0515  BP: (!) 155/75   Pulse:  (!) 57  Resp:    Temp:    SpO2:  100%    CONSTITUTIONAL: Well-appearing, NAD NEURO: Somnolent, wakes to painful stimulus, states name EYES:  eyes equal and reactive ENT/NECK:  no LAD, no JVD CARDIO: Regular rate, well-perfused, normal S1 and S2 PULM:  CTAB no wheezing or rhonchi GI/GU:  normal bowel sounds, non-distended, non-tender MSK/SPINE:  No gross deformities, no edema SKIN:  no rash, atraumatic PSYCH:  Appropriate speech and behavior  Diagnostic and Interventional Summary    EKG Interpretation  Date/Time:  Thursday May 09 2019 01:37:44 EST Ventricular Rate:  68 PR Interval:    QRS Duration: 128 QT Interval:  453 QTC Calculation: 482 R Axis:     Text Interpretation: Sinus rhythm Probable left ventricular hypertrophy Anterior ST elevation, probably due to LVH No significant change was found Confirmed by Gerlene Fee [phone]) on 05/09/2019 3:00:14 AM      Labs Reviewed  COMPREHENSIVE METABOLIC PANEL - Abnormal; Notable for the following components:      Result  Value   Sodium 130 (*)    CO2 17 (*)    Glucose, Bld 494 (*)    BUN 32 (*)    Creatinine, Ser 1.51 (*)    Calcium 8.5 (*)    Albumin  3.3 (*)    Alkaline Phosphatase 132 (*)    GFR calc non Af Amer 36 (*)    GFR calc Af Amer 42 (*)    All other components within normal limits  ETHANOL - Abnormal; Notable for the following components:   Alcohol, Ethyl (B) 21 (*)    All other components within normal limits  ACETAMINOPHEN LEVEL - Abnormal; Notable for the following components:   Acetaminophen (Tylenol), Serum <10 (*)    All other components within normal limits  CBC - Abnormal; Notable for the following components:   RBC 3.54 (*)    Hemoglobin 11.5 (*)    HCT 32.7 (*)    All other components within normal limits  RAPID URINE DRUG SCREEN, HOSP PERFORMED - Abnormal; Notable for the following components:   Cocaine POSITIVE (*)    All other components within normal limits  SARS CORONAVIRUS 2 (TAT 8-25 HRS)  SALICYLATE LEVEL  CBG MONITORING, ED    No orders to display    Medications  albuterol (VENTOLIN HFA) 108 (90 Base) MCG/ACT inhaler 2 puff (has no administration in time range)  amLODipine (NORVASC) tablet 10 mg (has no administration in time range)  gabapentin (NEURONTIN) capsule 300 mg (has no administration in time range)  losartan-hydrochlorothiazide (HYZAAR) 50-12.5 MG per tablet 1 tablet (has no administration in time range)  nortriptyline (PAMELOR) capsule 25 mg (has no administration in time range)  pantoprazole (PROTONIX) EC tablet 40 mg (has no administration in time range)  simvastatin (ZOCOR) tablet 10 mg (has no administration in time range)  sodium chloride 0.9 % bolus 1,000 mL (0 mLs Intravenous Stopped 05/09/19 0518)     Procedures  /  Critical Care Procedures  ED Course and Medical Decision Making  I have reviewed the triage vital signs and the nursing notes.  Pertinent labs & imaging results that were available during my care of the patient were reviewed  by me and considered in my medical decision making (see below for details).     Mixed overdose, largely reassuring vital signs, somnolent but protecting airway, no evidence of trauma, will need close observation.  Will monitor for respiratory depression and/or serotonin syndrome given a tramadol overdose.  Currently without clonus.  Discussed case with poison control, patient is largely only at risk for sedation with the tramadol overdose.  Patient was monitored for several hours in the emergency department.  She is now much more awake and able to converse.  She admits to recent depression.  She initially denied intentional self-harm with the overdose of tramadol, but she is interested in discussing this with the mental health experts.  I am concerned that this was an intentional suicide attempt.  IVC paperwork completed, awaiting TTS recommendations.  Medically cleared.  Barth Kirks. Sedonia Small, [place] [email or web address]  Final Clinical Impressions(s) / ED Diagnoses     ICD-10-CM   1. Drug overdose, undetermined intent, initial encounter  T50.904A   2. Current severe episode of major depressive disorder without psychotic features without prior episode ([place])  F32.2     ED Discharge Orders    None       Discharge Instructions Discussed with and Provided to Patient:   Discharge Instructions   None       Maudie Flakes, MD 05/09/19 [phone]

## 2019-05-09 NOTE — ED Notes (Signed)
Spoke with Kerry Dory at [facility] concerning pt admission there.

## 2019-05-09 NOTE — ED Triage Notes (Signed)
Patient is from home and transported via [facility]. According to EMS, patient overdosed on several pills, believed to be Tramadol, and alcohol consumption. EMS also brought a joint of unknown substance. About 30 minutes after consumption, she became altered. EMS obtained an IV and 568m NS administered. A female at the scene reported to EMS she has been manic, bi-polar for about a day.

## 2019-05-09 NOTE — ED Notes (Signed)
Pt requested supplies to get cleaned up and change clothes and it was given to her. IV access removed from left and right hand. Pt has been calm and cooperative all day.

## 2019-05-10 DIAGNOSIS — F332 Major depressive disorder, recurrent severe without psychotic features: Secondary | ICD-10-CM | POA: Diagnosis not present

## 2019-05-10 LAB — CBG MONITORING, ED
Glucose-Capillary: 158 mg/dL — ABNORMAL HIGH (ref 70–99)
Glucose-Capillary: 366 mg/dL — ABNORMAL HIGH (ref 70–99)
Glucose-Capillary: 394 mg/dL — ABNORMAL HIGH (ref 70–99)
Glucose-Capillary: 390 mg/dL — ABNORMAL HIGH (ref 70–99)
Glucose-Capillary: 328 mg/dL — ABNORMAL HIGH (ref 70–99)
Glucose-Capillary: 238 mg/dL — ABNORMAL HIGH (ref 70–99)
Glucose-Capillary: 231 mg/dL — ABNORMAL HIGH (ref 70–99)

## 2019-05-10 MED ORDER — INSULIN ASPART 100 UNIT/ML ~~LOC~~ SOLN
10.0000 [IU] | Freq: Once | SUBCUTANEOUS | Status: AC
  Administered 2019-05-10: 10 [IU] via SUBCUTANEOUS
  Filled 2019-05-10: qty 0.1

## 2019-05-10 MED ORDER — ACETAMINOPHEN 325 MG PO TABS
650.0000 mg | ORAL_TABLET | Freq: Four times a day (QID) | ORAL | Status: DC | PRN
  Administered 2019-05-10 – 2019-05-13 (×2): 650 mg via ORAL
  Filled 2019-05-10 (×2): qty 2

## 2019-05-10 MED ORDER — INSULIN ASPART 100 UNIT/ML ~~LOC~~ SOLN
5.0000 [IU] | Freq: Once | SUBCUTANEOUS | Status: AC
  Administered 2019-05-10: 5 [IU] via SUBCUTANEOUS
  Filled 2019-05-10: qty 0.05

## 2019-05-10 NOTE — ED Notes (Signed)
Pt alert and oriented. Pt calm, cooperative, no s/s of distress. Pt flat, guarded.

## 2019-05-10 NOTE — ED Notes (Signed)
[place] (son) [phone] [place](niece)

## 2019-05-10 NOTE — ED Provider Notes (Signed)
Emergency Medicine Observation Re-evaluation Note  Erica [name] is a 63 y.o. female, seen on rounds today.  Pt initially presented to the ED for complaints of Drug Overdose Currently, the patient is awake and alert.  Physical Exam  BP (!) 151/91 (BP Location: Right Arm)   Pulse 84   Temp 98.2 F (36.8 C) (Oral)   Resp 18   SpO2 97%  Physical Exam  ED Course / MDM  EKG:EKG Interpretation  Date/Time:  Thursday May 09 2019 01:37:44 EST Ventricular Rate:  68 PR Interval:    QRS Duration: 128 QT Interval:  453 QTC Calculation: 482 R Axis:     Text Interpretation: Sinus rhythm Probable left ventricular hypertrophy Anterior ST elevation, probably due to LVH No significant change was found Confirmed by Gerlene Fee [phone]) on 05/09/2019 3:00:14 AM Also confirmed by Gerlene Fee [phone]), editor Hattie Perch ([id number])  on 05/10/2019 7:09:27 AM    I have reviewed the labs performed to date as well as medications administered while in observation.  Recent changes in the last 24 hours include none. Plan  Current plan is for probable admission [facility]. Patient is under full IVC at this time.   Hayden Rasmussen, MD 05/10/19 [phone]

## 2019-05-10 NOTE — ED Notes (Signed)
Pt Blood glucose continues to be elevated. MD aware and orders were given. Pt has a bed at [facility] behavorial [facility] when blood glucose are lower.

## 2019-05-11 DIAGNOSIS — F332 Major depressive disorder, recurrent severe without psychotic features: Secondary | ICD-10-CM | POA: Diagnosis not present

## 2019-05-11 LAB — CBG MONITORING, ED
Glucose-Capillary: 136 mg/dL — ABNORMAL HIGH (ref 70–99)
Glucose-Capillary: 446 mg/dL — ABNORMAL HIGH (ref 70–99)
Glucose-Capillary: 528 mg/dL (ref 70–99)

## 2019-05-11 MED ORDER — LIP MEDEX EX OINT
TOPICAL_OINTMENT | Freq: Once | CUTANEOUS | Status: AC
  Administered 2019-05-11: 12:00:00 via TOPICAL
  Filled 2019-05-11: qty 7

## 2019-05-11 MED ORDER — INSULIN ASPART 100 UNIT/ML ~~LOC~~ SOLN
0.0000 [IU] | Freq: Three times a day (TID) | SUBCUTANEOUS | Status: DC
  Administered 2019-05-11: 15 [IU] via SUBCUTANEOUS
  Administered 2019-05-12 (×2): 11 [IU] via SUBCUTANEOUS
  Administered 2019-05-13: 8 [IU] via SUBCUTANEOUS
  Administered 2019-05-13: 15 [IU] via SUBCUTANEOUS
  Filled 2019-05-11: qty 0.15

## 2019-05-11 MED ORDER — SERTRALINE HCL 50 MG PO TABS
50.0000 mg | ORAL_TABLET | Freq: Every day | ORAL | Status: DC
  Administered 2019-05-11 – 2019-05-14 (×4): 50 mg via ORAL
  Filled 2019-05-11 (×4): qty 1

## 2019-05-11 NOTE — ED Notes (Signed)
Pt CBG 446 , was checked after eating dinner. Will recheck

## 2019-05-11 NOTE — ED Notes (Signed)
Pt is tearful in room, and is guarded at this time. Pt refused to take her medications. Pt encouraged to take medications, pt states " I don't care if I live or I die, it doesn't matter.".Pt reported that she had a HA but refused tylenol.

## 2019-05-11 NOTE — Consult Note (Addendum)
Corena Pilgrim, MD Psych ED Progress Note  05/11/2019 11:47 AM Erica [name]  MRN:  [id number] Subjective:  I just want to die.   Objective: Erica [name], 63 y.o., Yr. old female patient at [facility] assessed via tele psych by this provider and Dr. Drucie Ip chart reviewed on 05/11/19.     On evaluation Erica [name] is observed crying and does not appear to be forth coming with her state of depression. It is immediately apparent that patient is refusing to take her medications in an attempt to die. She does endorse ongoing suicidal thoughts with a plan to stop taking her medications. She does not participate with the interview much however is noted to be soft spoken and despondent. Discussed with patient the importance of taking her medications so that she can feel better. Discussed with her the reasons for coming to the hospital was to get help, and we will not let her die in the hospital. Also discussed with her about force med policy as she is IVC'd.    Principal Problem: MDD (major depressive disorder), recurrent severe, without psychosis ([place]) Diagnosis:  Principal Problem:   MDD (major depressive disorder), recurrent severe, without psychosis ([place]) Active Problems:   Suicide attempt ([place])   Cocaine abuse ([place])  Total Time spent with patient: 30 minutes  Past Psychiatric History: Depression  Past Medical History:  Past Medical History:  Diagnosis Date  . Arthritis    especially in shoulders  . Asthma   . Depression   . Diabetes mellitus   . GERD (gastroesophageal reflux disease)   . Headache(784.0)    "mild"  . Hypertension   . Mental disorder    depression  . Neuropathy    feet   . Pain    arthritis pain - takes tramadol as needed  . Rectal polyp    very little bleeding with bowel movements- no pain    Past Surgical History:  Procedure Laterality Date  . ANAL RECTAL MANOMETRY N/A 07/13/2016   Procedure: ANO RECTAL MANOMETRY;  Surgeon: Leighton Ruff, MD;  Location:  [facility]OSCOPY;  Service: Endoscopy;  Laterality: N/A;  . CESAREAN SECTION    . EUS N/A 11/21/2012   Procedure: LOWER ENDOSCOPIC ULTRASOUND (EUS);  Surgeon: Arta Silence, MD;  Location: [facility];  Service: Endoscopy;  Laterality: N/A;  . FLEXIBLE SIGMOIDOSCOPY N/A 11/21/2012   Procedure: FLEXIBLE SIGMOIDOSCOPY;  Surgeon: Arta Silence, MD;  Location: [facility];  Service: Endoscopy;  Laterality: N/A;  . FLEXIBLE SIGMOIDOSCOPY N/A 01/28/2013   Procedure: FLEXIBLE SIGMOIDOSCOPY;  Surgeon: Leighton Ruff, MD;  Location: [facility];  Service: Endoscopy;  Laterality: N/A;  . LAPAROSCOPIC LOW ANTERIOR RESECTION N/A 01/29/2013   Procedure: LAPAROSCOPIC LOW ANTERIOR RESECTION, Rigid Proctoscopy;  Surgeon: Leighton Ruff, MD;  Location: [facility] ORS;  Service: General;  Laterality: N/A;  . LAPAROSCOPIC SIGMOID COLECTOMY N/A 11/14/2012   Procedure: DIAGNOSTIC LAPAROSCOPY AND SIGMOIDMOIDOSCOPY ;  Surgeon: Rolm Bookbinder, MD;  Location: [facility];  Service: General;  Laterality: N/A;  . RECTAL ULTRASOUND N/A 07/13/2016   Procedure: RECTAL ULTRASOUND;  Surgeon: Leighton Ruff, MD;  Location: [facility];  Service: Endoscopy;  Laterality: N/A;  . TONSILLECTOMY    . TONSILLECTOMY AND ADENOIDECTOMY     Family History:  Family History  Problem Relation Age of Onset  . Hypertension Father   . Stroke Father   . Diabetes Father   . Heart disease Mother   . Hypertension Mother   . Hyperlipidemia Mother   . Hypertension Sister   .  Hypertension Brother   . Hypertension Sister   . Hypertension Brother   . Cancer Other 47       colon cancer   . Cancer Maternal Aunt        cancer, unknown type   . Cancer Maternal Uncle        bone cancer   . Cancer Paternal Aunt        lung cancer  . Cancer Paternal Uncle        lung cancer  . Cancer Maternal Uncle        colon cancer and prostate cancer   . Cancer Maternal Uncle        prostate cancer    Family Psychiatric  History: Unknown Social History:  Social  History   Substance and Sexual Activity  Alcohol Use Yes   Comment: Unknown      Social History   Substance and Sexual Activity  Drug Use Yes  . Types: "Crack" cocaine   Comment: Unknown     Social History   Socioeconomic History  . Marital status: Single    Spouse name: Not on file  . Number of children: Not on file  . Years of education: Not on file  . Highest education level: Not on file  Occupational History  . Not on file  Social Needs  . Financial resource strain: Not on file  . Food insecurity    Worry: Not on file    Inability: Not on file  . Transportation needs    Medical: Not on file    Non-medical: Not on file  Tobacco Use  . Smoking status: Former Smoker    Packs/day: 0.50    Years: 20.00    Pack years: 10.00    Types: Cigarettes    Quit date: 01/05/2018    Years since quitting: 1.3  . Smokeless tobacco: Never Used  . Tobacco comment: Unknown   Substance and Sexual Activity  . Alcohol use: Yes    Comment: Unknown   . Drug use: Yes    Types: "Crack" cocaine    Comment: Unknown   . Sexual activity: Never    Birth control/protection: None  Lifestyle  . Physical activity    Days per week: Not on file    Minutes per session: Not on file  . Stress: Not on file  Relationships  . Social Herbalist on phone: Not on file    Gets together: Not on file    Attends religious service: Not on file    Active member of club or organization: Not on file    Attends meetings of clubs or organizations: Not on file    Relationship status: Not on file  Other Topics Concern  . Not on file  Social History Narrative  . Not on file    Sleep: Fair  Appetite:  Good  Current Medications: Current Facility-Administered Medications  Medication Dose Route Frequency Provider Last Rate Last Dose  . acetaminophen (TYLENOL) tablet 650 mg  650 mg Oral Q6H PRN Sherwood Gambler, MD   650 mg at 05/10/19 1831  . albuterol (VENTOLIN HFA) 108 (90 Base) MCG/ACT  inhaler 2 puff  2 puff Inhalation Q4H PRN Maudie Flakes, MD      . amLODipine (NORVASC) tablet 10 mg  10 mg Oral Daily Maudie Flakes, MD   10 mg at 05/11/19 1005  . gabapentin (NEURONTIN) capsule 300 mg  300 mg Oral TID Maudie Flakes, MD  300 mg at 05/11/19 1030  . losartan (COZAAR) tablet 50 mg  50 mg Oral Daily Maudie Flakes, MD   50 mg at 05/11/19 1031   And  . hydrochlorothiazide (MICROZIDE) capsule 12.5 mg  12.5 mg Oral Daily Maudie Flakes, MD   12.5 mg at 05/11/19 1032  . insulin glargine (LANTUS) injection 100 Units  100 Units Subcutaneous QHS Blanchie Dessert, MD   100 Units at 05/10/19 2251  . nortriptyline (PAMELOR) capsule 25 mg  25 mg Oral QHS PRN Maudie Flakes, MD      . pantoprazole (PROTONIX) EC tablet 40 mg  40 mg Oral Daily Maudie Flakes, MD   40 mg at 05/11/19 1032  . simvastatin (ZOCOR) tablet 10 mg  10 mg Oral q1800 Maudie Flakes, MD   10 mg at 05/10/19 1831   Current Outpatient Medications  Medication Sig Dispense Refill  . albuterol (PROVENTIL HFA;VENTOLIN HFA) 108 (90 BASE) MCG/ACT inhaler Inhale 2 puffs into the lungs every 4 (four) hours as needed for wheezing.    Marland Kitchen albuterol (PROVENTIL) (2.5 MG/3ML) 0.083% nebulizer solution Take 3 mLs (2.5 mg total) by nebulization every 6 (six) hours as needed for wheezing or shortness of breath. 75 mL 12  . amLODipine (NORVASC) 10 MG tablet Take 10 mg by mouth daily.   2  . cetirizine (ZYRTEC) 10 MG tablet Take 10 mg by mouth daily as needed for allergies.     Marland Kitchen gabapentin (NEURONTIN) 300 MG capsule Take 300 mg by mouth 3 (three) times daily.     Marland Kitchen losartan-hydrochlorothiazide (HYZAAR) 50-12.5 MG tablet Take 1 tablet by mouth daily.    . montelukast (SINGULAIR) 10 MG tablet Take 1 tablet (10 mg total) by mouth at bedtime. 30 tablet 5  . Multiple Vitamin (MULTIVITAMIN WITH MINERALS) TABS tablet Take 1 tablet by mouth daily.    . nortriptyline (PAMELOR) 25 MG capsule Take 25 mg by mouth at bedtime as needed for sleep.    5  . pantoprazole (PROTONIX) 40 MG tablet Take 40 mg by mouth daily.    . simvastatin (ZOCOR) 10 MG tablet Take 10 mg by mouth daily.   5  . SYMBICORT 160-4.5 MCG/ACT inhaler Inhale 2 puffs into the lungs 2 (two) times daily.     . Tiotropium Bromide Monohydrate (SPIRIVA RESPIMAT) 1.25 MCG/ACT AERS Inhale 2 puffs into the lungs daily. 24 g 0  . TOUJEO SOLOSTAR 300 UNIT/ML SOPN Inject 100 Units into the skin at bedtime.   1  . traMADol (ULTRAM) 50 MG tablet Take 50 mg by mouth 2 (two) times daily as needed for moderate pain.       Lab Results:  Results for orders placed or performed during the hospital encounter of 05/09/19 (from the past 48 hour(s))  CBG monitoring, ED     Status: Abnormal   Collection Time: 05/09/19  6:35 PM  Result Value Ref Range   Glucose-Capillary 382 (H) 70 - 99 mg/dL   Comment 1 Notify RN    Comment 2 Document in Chart   CBG monitoring, ED     Status: Abnormal   Collection Time: 05/09/19  8:49 PM  Result Value Ref Range   Glucose-Capillary 362 (H) 70 - 99 mg/dL   Comment 1 Notify RN   CBG monitoring, ED     Status: Abnormal   Collection Time: 05/10/19  7:14 AM  Result Value Ref Range   Glucose-Capillary 158 (H) 70 - 99 mg/dL  CBG monitoring, ED  Status: Abnormal   Collection Time: 05/10/19 11:32 AM  Result Value Ref Range   Glucose-Capillary 366 (H) 70 - 99 mg/dL  CBG monitoring, ED     Status: Abnormal   Collection Time: 05/10/19 12:11 PM  Result Value Ref Range   Glucose-Capillary 394 (H) 70 - 99 mg/dL  CBG monitoring, ED     Status: Abnormal   Collection Time: 05/10/19  1:01 PM  Result Value Ref Range   Glucose-Capillary 390 (H) 70 - 99 mg/dL  CBG monitoring, ED     Status: Abnormal   Collection Time: 05/10/19  2:05 PM  Result Value Ref Range   Glucose-Capillary 328 (H) 70 - 99 mg/dL  CBG monitoring, ED     Status: Abnormal   Collection Time: 05/10/19  3:48 PM  Result Value Ref Range   Glucose-Capillary 238 (H) 70 - 99 mg/dL  CBG  monitoring, ED     Status: Abnormal   Collection Time: 05/10/19 10:20 PM  Result Value Ref Range   Glucose-Capillary 231 (H) 70 - 99 mg/dL  CBG monitoring, ED     Status: Abnormal   Collection Time: 05/11/19  7:12 AM  Result Value Ref Range   Glucose-Capillary 136 (H) 70 - 99 mg/dL    Blood Alcohol level:  Lab Results  Component Value Date   ETH 21 (H) 05/09/2019    Physical Findings: AIMS:  , ,  ,  ,    CIWA:    COWS:     Musculoskeletal: Strength & Muscle Tone: within normal limits Gait & Station: normal Patient leans: N/A  Psychiatric Specialty Exam: Physical Exam  Nursing note and vitals reviewed. Constitutional: She is oriented to person, place, and time. She appears well-developed.  HENT:  Head: Normocephalic.  Eyes: Pupils are equal, round, and reactive to light.  Neurological: She is alert and oriented to person, place, and time.    Review of Systems  Psychiatric/Behavioral: Positive for depression, substance abuse and suicidal ideas. Negative for hallucinations and memory loss. The patient is nervous/anxious and has insomnia.   All other systems reviewed and are negative.   Blood pressure (!) 143/92, pulse 86, temperature 98.1 F (36.7 C), temperature source Oral, resp. rate 20, SpO2 100 %.There is no height or weight on file to calculate BMI.  General Appearance: Fairly Groomed  Eye Contact:  Fair  Speech:  Clear and Coherent and Slow  Volume:  Decreased  Mood:  Depressed, Dysphoric, Hopeless and Worthless  Affect:  Flat, Restricted and Tearful  Thought Process:  Linear and Descriptions of Associations: Intact  Orientation:  Full (Time, Place, and Person)  Thought Content:  Illogical  Suicidal Thoughts:  Yes.  with intent/plan  Homicidal Thoughts:  No  Memory:  Immediate;   Fair Recent;   Fair  Judgement:  Intact  Insight:  Fair  Psychomotor Activity:  Decreased and Psychomotor Retardation  Concentration:  Concentration: Fair and Attention Span: Poor   Recall:  [employer] of Knowledge:  Fair  Language:  Negative  Akathisia:  No  Handed:  Right  AIMS (if indicated):     Assets:  Financial Resources/Insurance Social Support  ADL's:  Intact  Cognition:  WNL  Sleep:       Treatment Plan Summary: Daily contact with patient to assess and evaluate symptoms and progress in treatment and Medication management  Continue current medications. Patient has been accepted to [facility] pending blood sugars. Will start Zoloft 74m po daily for depression and anxiety. She may  benefit from adding SGA or Lithium to augment depression, and decrease suicidal ideations. Will need to discuss treatment options with patient as she already has diabetes and other comorbid conditions that can potentially be worsened by antipsychotic use. She still continues to meet inpatient criteria, she also meets criteria for IVC which we can continue to uphold due to her unwillingness to take her medications in an attempt to die. There is evidence that patient may deteriorate as evident by her blood glucose readings being greater than 400, and her Blood pressure being elevated. Failure to treat her illness would pose an imminent threat including death to the patient if not treated.    Suella Broad, FNP 05/11/2019, 11:47 AM   Patient seen face-to-face for psychiatric evaluation, chart reviewed and case discussed with the physician extender and developed treatment plan. Reviewed the information documented and agree with the treatment plan. Corena Pilgrim, MD

## 2019-05-11 NOTE — ED Notes (Signed)
RN attempt to call report to [facility] stated that they were not aware of this patient coming, in addition the situation at the facility has changed and that they don't have the staffing at this time.

## 2019-05-11 NOTE — ED Notes (Signed)
Informed Dr. Lita Mains that pt's CBG = 446 after she had eaten her dinner to see if any intervention is needed.

## 2019-05-11 NOTE — ED Notes (Signed)
Pt requested to be discharged after being transferred from the TCU to the Acute Unit.

## 2019-05-11 NOTE — ED Notes (Signed)
Pt is alert and oriented x 4 and is verbally responsive at this time. Pt denies any pain at this time.

## 2019-05-12 DIAGNOSIS — F332 Major depressive disorder, recurrent severe without psychotic features: Secondary | ICD-10-CM | POA: Diagnosis not present

## 2019-05-12 LAB — COMPREHENSIVE METABOLIC PANEL
ALT: 16 U/L (ref 0–44)
AST: 22 U/L (ref 15–41)
Albumin: 3.2 g/dL — ABNORMAL LOW (ref 3.5–5.0)
Alkaline Phosphatase: 110 U/L (ref 38–126)
Anion gap: 8 (ref 5–15)
BUN: 35 mg/dL — ABNORMAL HIGH (ref 8–23)
CO2: 22 mmol/L (ref 22–32)
Calcium: 8.7 mg/dL — ABNORMAL LOW (ref 8.9–10.3)
Chloride: 101 mmol/L (ref 98–111)
Creatinine, Ser: 1.69 mg/dL — ABNORMAL HIGH (ref 0.44–1.00)
GFR calc Af Amer: 37 mL/min — ABNORMAL LOW (ref >60)
GFR calc non Af Amer: 32 mL/min — ABNORMAL LOW (ref >60)
Glucose, Bld: 502 mg/dL (ref 70–99)
Potassium: 5 mmol/L (ref 3.5–5.1)
Sodium: 131 mmol/L — ABNORMAL LOW (ref 135–145)
Total Bilirubin: 1 mg/dL (ref 0.3–1.2)
Total Protein: 7.6 g/dL (ref 6.5–8.1)

## 2019-05-12 LAB — CBG MONITORING, ED
Glucose-Capillary: 86 mg/dL (ref 70–99)
Glucose-Capillary: 97 mg/dL (ref 70–99)
Glucose-Capillary: 302 mg/dL — ABNORMAL HIGH (ref 70–99)
Glucose-Capillary: 331 mg/dL — ABNORMAL HIGH (ref 70–99)
Glucose-Capillary: 312 mg/dL — ABNORMAL HIGH (ref 70–99)

## 2019-05-12 LAB — HEMOGLOBIN A1C
Hgb A1c MFr Bld: 7.9 % — ABNORMAL HIGH (ref 4.8–5.6)
Mean Plasma Glucose: 180.03 mg/dL

## 2019-05-12 NOTE — Consult Note (Addendum)
Erica Pilgrim, MD Psych ED Progress Note  05/12/2019 11:33 AM Erica [name]  MRN:  [id number] Subjective:  I just want to die.   Objective: Erica [name], 63 y.o., Yr. old female patient at [facility] assessed via tele psych by this provider and Dr. Drucie Ip chart reviewed on 05/12/19.     On evaluation Erica [name] presents with improved mood and affect. She is observed sitting up in the bed at this time. She is appropriate and shows some improvement in her insight. SHe reports improved suicidal ideations r/t her family and support system. SHe is also been compliant with her medications, and is expressing willingness to continue with treatment. She continues to endorse passive suicidal ideations at this time.   Principal Problem: MDD (major depressive disorder), recurrent severe, without psychosis ([place]) Diagnosis:  Principal Problem:   MDD (major depressive disorder), recurrent severe, without psychosis ([place]) Active Problems:   Suicide attempt ([place])   Cocaine abuse ([place])  Total Time spent with patient: 30 minutes  Past Psychiatric History: Depression  Past Medical History:  Past Medical History:  Diagnosis Date  . Arthritis    especially in shoulders  . Asthma   . Depression   . Diabetes mellitus   . GERD (gastroesophageal reflux disease)   . Headache(784.0)    "mild"  . Hypertension   . Mental disorder    depression  . Neuropathy    feet   . Pain    arthritis pain - takes tramadol as needed  . Rectal polyp    very little bleeding with bowel movements- no pain    Past Surgical History:  Procedure Laterality Date  . ANAL RECTAL MANOMETRY N/A 07/13/2016   Procedure: ANO RECTAL MANOMETRY;  Surgeon: Leighton Ruff, MD;  Location: [facility];  Service: Endoscopy;  Laterality: N/A;  . CESAREAN SECTION    . EUS N/A 11/21/2012   Procedure: LOWER ENDOSCOPIC ULTRASOUND (EUS);  Surgeon: Arta Silence, MD;  Location: [facility];  Service: Endoscopy;  Laterality: N/A;  .  FLEXIBLE SIGMOIDOSCOPY N/A 11/21/2012   Procedure: FLEXIBLE SIGMOIDOSCOPY;  Surgeon: Arta Silence, MD;  Location: [facility];  Service: Endoscopy;  Laterality: N/A;  . FLEXIBLE SIGMOIDOSCOPY N/A 01/28/2013   Procedure: FLEXIBLE SIGMOIDOSCOPY;  Surgeon: Leighton Ruff, MD;  Location: [facility];  Service: Endoscopy;  Laterality: N/A;  . LAPAROSCOPIC LOW ANTERIOR RESECTION N/A 01/29/2013   Procedure: LAPAROSCOPIC LOW ANTERIOR RESECTION, Rigid Proctoscopy;  Surgeon: Leighton Ruff, MD;  Location: [facility];  Service: General;  Laterality: N/A;  . LAPAROSCOPIC SIGMOID COLECTOMY N/A 11/14/2012   Procedure: DIAGNOSTIC LAPAROSCOPY AND SIGMOIDMOIDOSCOPY ;  Surgeon: Rolm Bookbinder, MD;  Location: [facility];  Service: General;  Laterality: N/A;  . RECTAL ULTRASOUND N/A 07/13/2016   Procedure: RECTAL ULTRASOUND;  Surgeon: Leighton Ruff, MD;  Location: [facility];  Service: Endoscopy;  Laterality: N/A;  . TONSILLECTOMY    . TONSILLECTOMY AND ADENOIDECTOMY     Family History:  Family History  Problem Relation Age of Onset  . Hypertension Father   . Stroke Father   . Diabetes Father   . Heart disease Mother   . Hypertension Mother   . Hyperlipidemia Mother   . Hypertension Sister   . Hypertension Brother   . Hypertension Sister   . Hypertension Brother   . Cancer Other 47       colon cancer   . Cancer Maternal Aunt        cancer, unknown type   . Cancer Maternal Uncle  bone cancer   . Cancer Paternal Aunt        lung cancer  . Cancer Paternal Uncle        lung cancer  . Cancer Maternal Uncle        colon cancer and prostate cancer   . Cancer Maternal Uncle        prostate cancer    Family Psychiatric  History: Unknown Social History:  Social History   Substance and Sexual Activity  Alcohol Use Yes   Comment: Unknown      Social History   Substance and Sexual Activity  Drug Use Yes  . Types: "Crack" cocaine   Comment: Unknown     Social History   Socioeconomic History   . Marital status: Single    Spouse name: Not on file  . Number of children: Not on file  . Years of education: Not on file  . Highest education level: Not on file  Occupational History  . Not on file  Social Needs  . Financial resource strain: Not on file  . Food insecurity    Worry: Not on file    Inability: Not on file  . Transportation needs    Medical: Not on file    Non-medical: Not on file  Tobacco Use  . Smoking status: Former Smoker    Packs/day: 0.50    Years: 20.00    Pack years: 10.00    Types: Cigarettes    Quit date: 01/05/2018    Years since quitting: 1.3  . Smokeless tobacco: Never Used  . Tobacco comment: Unknown   Substance and Sexual Activity  . Alcohol use: Yes    Comment: Unknown   . Drug use: Yes    Types: "Crack" cocaine    Comment: Unknown   . Sexual activity: Never    Birth control/protection: None  Lifestyle  . Physical activity    Days per week: Not on file    Minutes per session: Not on file  . Stress: Not on file  Relationships  . Social Herbalist on phone: Not on file    Gets together: Not on file    Attends religious service: Not on file    Active member of club or organization: Not on file    Attends meetings of clubs or organizations: Not on file    Relationship status: Not on file  Other Topics Concern  . Not on file  Social History Narrative  . Not on file    Sleep: Fair  Appetite:  Good  Current Medications: Current Facility-Administered Medications  Medication Dose Route Frequency Provider Last Rate Last Dose  . acetaminophen (TYLENOL) tablet 650 mg  650 mg Oral Q6H PRN Sherwood Gambler, MD   650 mg at 05/10/19 1831  . albuterol (VENTOLIN HFA) 108 (90 Base) MCG/ACT inhaler 2 puff  2 puff Inhalation Q4H PRN Maudie Flakes, MD   2 puff at 05/11/19 1427  . amLODipine (NORVASC) tablet 10 mg  10 mg Oral Daily Maudie Flakes, MD   10 mg at 05/12/19 0932  . gabapentin (NEURONTIN) capsule 300 mg  300 mg Oral TID  Maudie Flakes, MD   300 mg at 05/12/19 0932  . losartan (COZAAR) tablet 50 mg  50 mg Oral Daily Maudie Flakes, MD   50 mg at 05/12/19 0932   And  . hydrochlorothiazide (MICROZIDE) capsule 12.5 mg  12.5 mg Oral Daily Maudie Flakes, MD   12.5 mg  at 05/12/19 0932  . insulin aspart (novoLOG) injection 0-15 Units  0-15 Units Subcutaneous TID WC Blanchie Dessert, MD   15 Units at 05/11/19 2255  . insulin glargine (LANTUS) injection 100 Units  100 Units Subcutaneous QHS Blanchie Dessert, MD   100 Units at 05/11/19 2255  . nortriptyline (PAMELOR) capsule 25 mg  25 mg Oral QHS PRN Maudie Flakes, MD      . pantoprazole (PROTONIX) EC tablet 40 mg  40 mg Oral Daily Maudie Flakes, MD   40 mg at 05/12/19 0932  . sertraline (ZOLOFT) tablet 50 mg  50 mg Oral Daily Suella Broad, FNP   50 mg at 05/12/19 0932  . simvastatin (ZOCOR) tablet 10 mg  10 mg Oral q1800 Maudie Flakes, MD   10 mg at 05/10/19 1831   Current Outpatient Medications  Medication Sig Dispense Refill  . albuterol (PROVENTIL HFA;VENTOLIN HFA) 108 (90 BASE) MCG/ACT inhaler Inhale 2 puffs into the lungs every 4 (four) hours as needed for wheezing.    Marland Kitchen albuterol (PROVENTIL) (2.5 MG/3ML) 0.083% nebulizer solution Take 3 mLs (2.5 mg total) by nebulization every 6 (six) hours as needed for wheezing or shortness of breath. 75 mL 12  . amLODipine (NORVASC) 10 MG tablet Take 10 mg by mouth daily.   2  . cetirizine (ZYRTEC) 10 MG tablet Take 10 mg by mouth daily as needed for allergies.     Marland Kitchen gabapentin (NEURONTIN) 300 MG capsule Take 300 mg by mouth 3 (three) times daily.     Marland Kitchen losartan-hydrochlorothiazide (HYZAAR) 50-12.5 MG tablet Take 1 tablet by mouth daily.    . montelukast (SINGULAIR) 10 MG tablet Take 1 tablet (10 mg total) by mouth at bedtime. 30 tablet 5  . Multiple Vitamin (MULTIVITAMIN WITH MINERALS) TABS tablet Take 1 tablet by mouth daily.    . nortriptyline (PAMELOR) 25 MG capsule Take 25 mg by mouth at bedtime as  needed for sleep.   5  . pantoprazole (PROTONIX) 40 MG tablet Take 40 mg by mouth daily.    . simvastatin (ZOCOR) 10 MG tablet Take 10 mg by mouth daily.   5  . SYMBICORT 160-4.5 MCG/ACT inhaler Inhale 2 puffs into the lungs 2 (two) times daily.     . Tiotropium Bromide Monohydrate (SPIRIVA RESPIMAT) 1.25 MCG/ACT AERS Inhale 2 puffs into the lungs daily. 24 g 0  . TOUJEO SOLOSTAR 300 UNIT/ML SOPN Inject 100 Units into the skin at bedtime.   1  . traMADol (ULTRAM) 50 MG tablet Take 50 mg by mouth 2 (two) times daily as needed for moderate pain.       Lab Results:  Results for orders placed or performed during the hospital encounter of 05/09/19 (from the past 48 hour(s))  CBG monitoring, ED     Status: Abnormal   Collection Time: 05/10/19 12:11 PM  Result Value Ref Range   Glucose-Capillary 394 (H) 70 - 99 mg/dL  CBG monitoring, ED     Status: Abnormal   Collection Time: 05/10/19  1:01 PM  Result Value Ref Range   Glucose-Capillary 390 (H) 70 - 99 mg/dL  CBG monitoring, ED     Status: Abnormal   Collection Time: 05/10/19  2:05 PM  Result Value Ref Range   Glucose-Capillary 328 (H) 70 - 99 mg/dL  CBG monitoring, ED     Status: Abnormal   Collection Time: 05/10/19  3:48 PM  Result Value Ref Range   Glucose-Capillary 238 (H) 70 - 99  mg/dL  CBG monitoring, ED     Status: Abnormal   Collection Time: 05/10/19 10:20 PM  Result Value Ref Range   Glucose-Capillary 231 (H) 70 - 99 mg/dL  CBG monitoring, ED     Status: Abnormal   Collection Time: 05/11/19  7:12 AM  Result Value Ref Range   Glucose-Capillary 136 (H) 70 - 99 mg/dL  CBG monitoring, ED     Status: Abnormal   Collection Time: 05/11/19  4:53 PM  Result Value Ref Range   Glucose-Capillary 446 (H) 70 - 99 mg/dL   Comment 1 Notify RN    Comment 2 Document in Chart   CBG monitoring, ED     Status: Abnormal   Collection Time: 05/11/19  9:24 PM  Result Value Ref Range   Glucose-Capillary 528 (HH) 70 - 99 mg/dL  Hemoglobin A1c      Status: Abnormal   Collection Time: 05/11/19 10:20 PM  Result Value Ref Range   Hgb A1c MFr Bld 7.9 (H) 4.8 - 5.6 %    Comment: (NOTE) Pre diabetes:          5.7%-6.4% Diabetes:              >6.4% Glycemic control for   <7.0% adults with diabetes    Mean Plasma Glucose 180.03 mg/dL    Comment: Performed at [facility], [place] [address], [place], [place] [address]  Comprehensive metabolic panel     Status: Abnormal   Collection Time: 05/11/19 10:20 PM  Result Value Ref Range   Sodium 131 (L) 135 - 145 mmol/L   Potassium 5.0 3.5 - 5.1 mmol/L   Chloride 101 98 - 111 mmol/L   CO2 22 22 - 32 mmol/L   Glucose, Bld 502 (HH) 70 - 99 mg/dL    Comment: BROOKS,RN ON 05/11/19 @ 2304 BY LE   BUN 35 (H) 8 - 23 mg/dL   Creatinine, Ser 1.69 (H) 0.44 - 1.00 mg/dL   Calcium 8.7 (L) 8.9 - 10.3 mg/dL   Total Protein 7.6 6.5 - 8.1 g/dL   Albumin 3.2 (L) 3.5 - 5.0 g/dL   AST 22 15 - 41 U/L   ALT 16 0 - 44 U/L   Alkaline Phosphatase 110 38 - 126 U/L   Total Bilirubin 1.0 0.3 - 1.2 mg/dL   GFR calc non Af Amer 32 (L) >60 mL/min   GFR calc Af Amer 37 (L) >60 mL/min   Anion gap 8 5 - 15    Comment: Performed at [facility], [place] [address], [place], [place] [address]  CBG monitoring, ED     Status: None   Collection Time: 05/12/19  6:07 AM  Result Value Ref Range   Glucose-Capillary 86 70 - 99 mg/dL  CBG monitoring, ED     Status: None   Collection Time: 05/12/19  7:38 AM  Result Value Ref Range   Glucose-Capillary 97 70 - 99 mg/dL    Blood Alcohol level:  Lab Results  Component Value Date   ETH 21 (H) 05/09/2019    Physical Findings: AIMS:  , ,  ,  ,    CIWA:    COWS:     Musculoskeletal: Strength & Muscle Tone: within normal limits Gait & Station: normal Patient leans: N/A  Psychiatric Specialty Exam: Physical Exam  Nursing note and vitals reviewed. Constitutional: She is oriented to person, place, and time. She appears well-developed.  HENT:  Head:  Normocephalic.  Eyes: Pupils are  equal, round, and reactive to light.  Neurological: She is alert and oriented to person, place, and time.    Review of Systems  Psychiatric/Behavioral: Positive for depression, substance abuse and suicidal ideas. Negative for hallucinations and memory loss. The patient is nervous/anxious and has insomnia.   All other systems reviewed and are negative.   Blood pressure 126/87, pulse 85, temperature 98.1 F (36.7 C), temperature source Oral, resp. rate 20, SpO2 99 %.There is no height or weight on file to calculate BMI.  General Appearance: Fairly Groomed  Eye Contact:  Fair  Speech:  Clear and Coherent and Slow  Volume:  Decreased  Mood:  Depressed  Affect:  Depressed and Flat  Thought Process:  Coherent, Linear and Descriptions of Associations: Intact  Orientation:  Full (Time, Place, and Person)  Thought Content:  Logical  Suicidal Thoughts:  No  Homicidal Thoughts:  No  Memory:  Immediate;   Fair Recent;   Fair  Judgement:  Intact  Insight:  Fair  Psychomotor Activity:  Decreased and Psychomotor Retardation  Concentration:  Concentration: Fair and Attention Span: Poor  Recall:  [employer] of Knowledge:  Fair  Language:  Negative  Akathisia:  No  Handed:  Right  AIMS (if indicated):     Assets:  Financial Resources/Insurance Social Support  ADL's:  Intact  Cognition:  WNL  Sleep:       Treatment Plan Summary: Daily contact with patient to assess and evaluate symptoms and progress in treatment and Medication management  Continue current medications.Continue to recommend inpatient treatment at this time. Continue Zoloft 62m po daily for depression.  [facility] elevated blood sugars, and census/staffing.   TSuella Broad FNP 05/12/2019, 11:33 AM   Patient seen face-to-face for psychiatric evaluation, chart reviewed and case discussed with the physician  extender and developed treatment plan. Reviewed the information documented and agree with the treatment plan. MCorena Pilgrim MD

## 2019-05-12 NOTE — ED Notes (Signed)
[facility] called, stated blood sugar needs to be under 300 for 24 hrs.

## 2019-05-12 NOTE — ED Notes (Signed)
Called [facility] to see if bed available. Bed is not available at this time.[facility] will call if becomes available. Will f/u

## 2019-05-12 NOTE — ED Notes (Signed)
Pt calm, cooperative, guarded, no s/s of stress.

## 2019-05-13 DIAGNOSIS — F332 Major depressive disorder, recurrent severe without psychotic features: Secondary | ICD-10-CM | POA: Diagnosis not present

## 2019-05-13 LAB — CBG MONITORING, ED
Glucose-Capillary: 113 mg/dL — ABNORMAL HIGH (ref 70–99)
Glucose-Capillary: 381 mg/dL — ABNORMAL HIGH (ref 70–99)
Glucose-Capillary: 258 mg/dL — ABNORMAL HIGH (ref 70–99)
Glucose-Capillary: 300 mg/dL — ABNORMAL HIGH (ref 70–99)

## 2019-05-13 MED ORDER — INSULIN ASPART 100 UNIT/ML ~~LOC~~ SOLN
8.0000 [IU] | Freq: Once | SUBCUTANEOUS | Status: DC
  Filled 2019-05-13: qty 0.08

## 2019-05-13 MED ORDER — LORATADINE 10 MG PO TABS
10.0000 mg | ORAL_TABLET | Freq: Once | ORAL | Status: AC
  Administered 2019-05-13: 10 mg via ORAL
  Filled 2019-05-13: qty 1

## 2019-05-13 NOTE — Care Management (Signed)
Patient accepted to: [facility]  Accepting MD Jonelle Sports The number to give report [phone] The date and time that the patient can be transported on 05-14-2019 after 9am   Writer informed the RN working with the patient.

## 2019-05-13 NOTE — ED Notes (Signed)
Attempted to call report and they are still under the impression that Blood Sugar has to be below 300 for 24 hours.  Nurse will check and call back.

## 2019-05-13 NOTE — Progress Notes (Signed)
Inpatient Diabetes Program Recommendations  AACE/ADA: New Consensus Statement on Inpatient Glycemic Control (2015)  Target Ranges:  Prepandial:   less than 140 mg/dL      Peak postprandial:   less than 180 mg/dL (1-2 hours)      Critically ill patients:  140 - 180 mg/dL   Lab Results  Component Value Date   GLUCAP 381 (H) 05/13/2019   HGBA1C 7.9 (H) 05/11/2019    Review of Glycemic Control  Diabetes history: DM2 Outpatient Diabetes medications: Toujeo 100 units QHS Current orders for Inpatient glycemic control: Lantus 100 units QHS, Novolog 0-15 units tidwc, Novolog 8 units x 1  HgbA1C - 7.9% Post-prandial elevated after eating pancakes and syrup for breakfast. Diet changed to CHO mod med.  Inpatient Diabetes Program Recommendations:     Add Novolog 8 units tidwc for meal coverage insulin. Do not give if pt eats < 50% meal.  HgbA1C in acceptable range. Discussed with RN.  Thank you. Lorenda Peck, RD, LDN, CDE Inpatient Diabetes Coordinator [phone]

## 2019-05-13 NOTE — Care Management (Signed)
Writer informed TTS that the patient has been placed at [facility]

## 2019-05-13 NOTE — ED Notes (Signed)
Attempted to call report again after being told that Dr Dwyane Dee said send patient.  Pts lunchtime blood sugar was elevated and Erica Maffucci NP said they could not take her.

## 2019-05-13 NOTE — Progress Notes (Signed)
Received Erica [name] this PM at shift change awake in in her room. She is OOB at intervals without incident. She was compliant with her medications. She endorsed feeling depressed, but denied feeling suicidal at the present time. She slept throughout the night.

## 2019-05-14 DIAGNOSIS — F332 Major depressive disorder, recurrent severe without psychotic features: Secondary | ICD-10-CM | POA: Diagnosis not present

## 2019-05-14 LAB — CBG MONITORING, ED
Glucose-Capillary: 54 mg/dL — ABNORMAL LOW (ref 70–99)
Glucose-Capillary: 82 mg/dL (ref 70–99)

## 2019-05-14 MED ORDER — LOSARTAN POTASSIUM 50 MG PO TABS: 50.0000 mg | ORAL_TABLET | Freq: Every day | ORAL | 0 refills | Status: DC

## 2019-05-14 MED ORDER — HYDROCHLOROTHIAZIDE 12.5 MG PO CAPS: 12.5000 mg | ORAL_CAPSULE | Freq: Every day | ORAL | 0 refills | Status: DC

## 2019-05-14 MED ORDER — SERTRALINE HCL 50 MG PO TABS: 50.0000 mg | ORAL_TABLET | Freq: Every day | ORAL | 0 refills | Status: DC

## 2019-05-14 NOTE — Discharge Summary (Addendum)
  Patient to be transferred to [facility] to NP note

## 2019-05-14 NOTE — ED Notes (Signed)
Transported to [facility] by [facility]. All belongings given to [facility]. Pt was calm and cooperative.

## 2019-05-14 NOTE — ED Notes (Signed)
CBG this am was 54. Gave 2 OJ and peanut butter crackers which pt consumed. She is not symptomatic but said, "I was feeling a little funny."  Notified Dr. Roslynn Amble and will recheck in 30 minutes unless advised otherwise.

## 2019-05-23 ENCOUNTER — Encounter: Payer: Self-pay | Admitting: Internal Medicine

## 2019-05-28 ENCOUNTER — Telehealth: Payer: Self-pay | Admitting: Internal Medicine

## 2019-05-28 NOTE — Telephone Encounter (Signed)
Saw her end oct 2020 and ordred CT for ILD. 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 She then saw Wyn Quaker 04/26/2019 and rec is to discuss antifuibrotics. However, this next followup with ith APP 06/12/2019. Prefer she have her PFT and then come and see me so I can discuss diagnosis and RX with her. Can be in Jan 2021 as well

## 2019-05-28 NOTE — Telephone Encounter (Signed)
Pt is scheduled to have PFT 12/30 at 3pm.  We need to leave this appt as scheduled for pt due to the PFT schedule being booked out until around Feb now.  Pt's appt with Eustaquio Maize can be cancelled and rescheduled with MR in January.  Attempted to call pt to get her on the schedule for a f/u with MR so he can discuss the results of the PFT and CT with her but unable to reach. Left message for pt to return call.

## 2019-05-29 ENCOUNTER — Telehealth: Payer: Self-pay | Admitting: Internal Medicine

## 2019-05-29 NOTE — Telephone Encounter (Signed)
appt for pt with MR needs to be 82mn in ILD clinic after PFT with MR.  [facility]. Pt will need to have appt scheduled with MR following PFT.  Attempted to call pt but unable to reach. Left message for pt to return call.

## 2019-05-29 NOTE — Telephone Encounter (Signed)
ono on RA - 1 min 4 sec of pulse ox </- 88%  Plan  - no need for night o2     SIGNATURE    Dr. Brand Males, M.D., F.C.C.P,  Pulmonary and Critical Care Medicine Staff Physician, [place] Director - Interstitial Lung Disease  Program  Pulmonary [place] at [place], [place], [address]  Pager: [phone], If no answer or between  15:00h - 7:00h: call [phone] Telephone: [phone]  4:54 PM 05/29/2019

## 2019-05-29 NOTE — Telephone Encounter (Signed)
Patient is returning phone call.  Patient phone number is [phone].

## 2019-05-29 NOTE — Telephone Encounter (Signed)
MR, do you want her to be in a 30 min slot? Please advise before I call her to get scheduled.

## 2019-05-30 NOTE — Telephone Encounter (Signed)
Called and spoke with pt letting her know that MR wanted her to see him after the PFT and she verbalized understanding. appt scheduled for pt with MR 1/5. Nothing further needed.

## 2019-05-30 NOTE — Telephone Encounter (Signed)
Called and spoke with pt letting her know the results of the ono and pt verbalized understanding.nothing further needed.

## 2019-06-10 ENCOUNTER — Other Ambulatory Visit (HOSPITAL_COMMUNITY)
Admission: RE | Admit: 2019-06-10 | Discharge: 2019-06-10 | Disposition: A | Discharge status: Home / Self Care | Payer: Medicare HMO | Source: Ambulatory Visit | Attending: Internal Medicine | Admitting: Internal Medicine

## 2019-06-10 DIAGNOSIS — Z20828 Contact with and (suspected) exposure to other viral communicable diseases: Secondary | ICD-10-CM | POA: Diagnosis not present

## 2019-06-10 DIAGNOSIS — Z01812 Encounter for preprocedural laboratory examination: Secondary | ICD-10-CM | POA: Insufficient documentation

## 2019-06-10 LAB — SARS CORONAVIRUS 2 (TAT 6-24 HRS): SARS Coronavirus 2: NEGATIVE

## 2019-06-12 ENCOUNTER — Ambulatory Visit: Payer: Medicare HMO | Admitting: Primary Care

## 2019-06-13 ENCOUNTER — Other Ambulatory Visit: Payer: Self-pay

## 2019-06-13 ENCOUNTER — Ambulatory Visit (INDEPENDENT_AMBULATORY_CARE_PROVIDER_SITE_OTHER): Payer: Medicare HMO | Admitting: Internal Medicine

## 2019-06-13 DIAGNOSIS — J849 Interstitial pulmonary disease, unspecified: Secondary | ICD-10-CM | POA: Diagnosis not present

## 2019-06-13 LAB — PULMONARY FUNCTION TEST
DL/VA % pred: 47 %
DL/VA: 1.99 ml/min/mmHg/L
DLCO unc % pred: 38 %
DLCO unc: 7.7 ml/min/mmHg
FEF 25-75 Post: 2.33 L/sec
FEF 25-75 Pre: 2.12 L/sec
FEF2575-%Change-Post: 9 %
FEF2575-%Pred-Post: 121 %
FEF2575-%Pred-Pre: 110 %
FEV1-%Change-Post: 3 %
FEV1-%Pred-Post: 106 %
FEV1-%Pred-Pre: 102 %
FEV1-Post: 2.13 L
FEV1-Pre: 2.05 L
FEV1FVC-%Change-Post: 0 %
FEV1FVC-%Pred-Pre: 103 %
FEV6-%Change-Post: 4 %
FEV6-%Pred-Post: 106 %
FEV6-%Pred-Pre: 102 %
FEV6-Post: 2.63 L
FEV6-Pre: 2.52 L
FEV6FVC-%Change-Post: 0 %
FEV6FVC-%Pred-Post: 103 %
FEV6FVC-%Pred-Pre: 103 %
FVC-%Change-Post: 4 %
FVC-%Pred-Post: 102 %
FVC-%Pred-Pre: 98 %
FVC-Post: 2.63 L
FVC-Pre: 2.52 L
Post FEV1/FVC ratio: 81 %
Post FEV6/FVC ratio: 100 %
Pre FEV1/FVC ratio: 81 %
Pre FEV6/FVC Ratio: 100 %
RV % pred: 75 %
RV: 1.57 L
TLC % pred: 88 %
TLC: 4.48 L

## 2019-06-13 NOTE — Progress Notes (Signed)
PFT done today. 

## 2019-06-17 ENCOUNTER — Ambulatory Visit: Payer: Medicare HMO | Admitting: Podiatry

## 2019-06-18 ENCOUNTER — Ambulatory Visit (INDEPENDENT_AMBULATORY_CARE_PROVIDER_SITE_OTHER): Payer: Medicare HMO | Admitting: Internal Medicine

## 2019-06-18 ENCOUNTER — Encounter: Payer: Self-pay | Admitting: Internal Medicine

## 2019-06-18 ENCOUNTER — Other Ambulatory Visit: Payer: Self-pay

## 2019-06-18 VITALS — BP 154/84 | HR 91 | Temp 97.8°F | Ht 63.0 in | Wt 192.8 lb

## 2019-06-18 DIAGNOSIS — J431 Panlobular emphysema: Secondary | ICD-10-CM | POA: Diagnosis not present

## 2019-06-18 DIAGNOSIS — I2584 Coronary atherosclerosis due to calcified coronary lesion: Secondary | ICD-10-CM

## 2019-06-18 DIAGNOSIS — J849 Interstitial pulmonary disease, unspecified: Secondary | ICD-10-CM

## 2019-06-18 DIAGNOSIS — J84112 Idiopathic pulmonary fibrosis: Secondary | ICD-10-CM

## 2019-06-18 DIAGNOSIS — I2510 Atherosclerotic heart disease of native coronary artery without angina pectoris: Secondary | ICD-10-CM

## 2019-06-18 NOTE — Progress Notes (Signed)
This is the case of Erica [name], 64 y.o. Female, who was referred by Dr. Lorella Nimrod in consultation regarding abnormal chest ct scan.   As you very well know, patient smoked 1 pack per week since her 56s, curently down to [name], was diagnosed with asthma/copd several yrs ago. Uses alb MDI prn, usually 1-2 x/week. She gets more than SOB with more than ADLs. She is not on o2.   She had rectal CA in 2014 for which she had surgery. Still in remission.  She had [name] in 01/2015 as part of CA surveillance.  That showed several pulm nodules (subcm) which have been stable, and some smaller.  Also with increased markings at the bases.   She had sinus issues in December.  Wen tot ER as she was not getting better.  CXR showed inc interstitial markings, hence the consult today. She is almost back to her baseline.   She has GERD, controlled with meds. She coughs daily, coughs in sleep. Cough worse at night.  On and off wheezing at HS.   Her sinus issues are better. Has PND.    Has snoring, gaspin, choking, unrefreshed sleep.  Wakes up tired and fatigued.  ESS 10. Hypersomnia affects her fxnality.    ROV  08/12/16 Patient returns to the office as follow-up after her tests. Since last seen, she has quit smoking. She had PFT  which showed mild COPD with an FEV1 of 1. 72 or 84% of predicted. Diffusion capacity was 61%. Her sleep study was (-) for OSA. She is able to do her ADLs without much difficulty. Gets winded with more than ADLs. Her reflux is controlled with medicines and diet changes. Her cough is also stable.     09/06/2017 Follow up : COPD and ILD  Pt returns for 1 year follow up . She was last seen in March 2018 . Found to have mild COPD and ILD changes on CT chest that are mild worse than 2015 CT scan .  She had autoimmune labs that essentially neg. She did not keep follow up .  Recommended on smoking cessation.  Says that she has baseline dyspnea with activity  none at rest , and dry cough .  Over last year feels dyspnea with activity has gotten worse. She gets winded easily .  No increased cough .  Still smoking, cessation discussed.   OV 10/24/2017  Chief Complaint  Patient presents with  . Follow-up    PFT was attempted today but pt unable to complete due to some sinus problems. Pt has some yellow to green mucus postnasally and also is coughing up [name] as well. Pt also states she has increased SOB due to symptoms.    Erica [name] presents to the interstitial lung disease clinic for evaluation of potential interstitial lung disease.  She is followed in the general pulmonary clinic as stated above.  Her other issues ongoing smoking.  She is somewhat of [name] poor historian and as soon as I walked in she reported [name] of acute sinus congestion for the last 2 weeks after having picked up [name] cold from her sister.  Also the ongoing spring pollen season is making things worse.  She is coughing up some green sputum.  She feels this needs to be treated by me and she wants an antibiotic for this.  There is no associated wheezing or cough or shortness of breath  because of this.  We discussed her underlying lung disease and she tells me that she is aware that she has COPD/emphysema.  But she is totally unaware about pulmonary fibrosis or interstitial lung disease.  She tells me that overall compared to last year she is stable and she only has mild dyspnea on exertion without much of [name].  There is no chest pain no orthopnea proximal nocturnal dyspnea wheezing or hemoptysis or weight loss.  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  Only takes albuerol as needed.  Her last CT scan of the chest high resolution was in January 2018 that thoracic radiology Dr. Lorin Picket described as indicative of UIP.  I personally visualized the CT and I personally think this is indeterminate for UIP.  There is bilateral  bibasal craniocaudal gradients reticular abnormalities but I am not so sure that these are subpleural and more of I do not see clear-cut traction bronchiectasis or honeycombing although I will agree that there is no groundglass.   She has ongoing smoking    [name], Erica [name] (MRN [id number]) as of 10/24/2017 16:28  Ref. Range 08/12/2016 14:23 09/07/2016 11:20  Anit Nuclear Antibody(ANA) Latest Ref Range: NEGATIVE  NEG   ANCA Proteinase 3 Latest Ref Range: 0.0 - 3.5 U/mL  <3.5  Anti JO-1 Latest Ref Range: 0.0 - 0.9 AI <3.1 <5.9  Cyclic Citrullin Peptide Ab Latest Units: Units <16   ds DNA Ab Latest Units: IU/mL <1   Myeloperoxidase Ab Latest Ref Range: 0.0 - 9.0 U/mL  <9.0  Cytoplasmic (C-ANCA) Latest Ref Range: <1:20  1:40 (H)   Scleroderma (Scl-70) (ENA) Antibody, IgG Latest Ref Range: <1.0 NEG AI  <1.0 NEG        OV 04/08/2019  Subjective:  Patient ID: Erica [name], female , DOB: Jul 23, 1955 , age 64 y.o. , MRN: [id number] , ADDRESS: [place] [id number]   04/08/2019 -   Chief Complaint  Patient presents with  . Follow-up    Patient reports that she still has sob with exertion and some cough. She states that some days are better than others.    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 -returns for follow-up.  Last seen by myself in May 2019.  At that point in time I ordered [name] high-resolution CT chest.  She had it but has not properly followed up.  She in between had [name] telephone visit with one of the nurse practitioners and was given COPD medication refill.  After that she had [name] visit with another nurse practitioner.  She tells me that in the last year she is quit smoking.  She is also quit working because of dyspnea on exertion relieved by rest.  She tells me that since last year her dyspnea is progressive.  She is able to climb 1 flight of stairs and then has to  stop because of extreme shortness of breath.  She is continuing with her inhalers.  She does not have nighttime or daytime oxygen.  Review of the CT scan and visualization of the CT scan from 1 year ago shows UIP features.  There are no other new issues.     CLINICAL DATA:  Interstitial lung disease.  EXAM: CT CHEST WITHOUT CONTRAST - 04/09/2018  TECHNIQUE: Multidetector CT imaging of the chest was performed following the standard  protocol without intravenous contrast. High resolution imaging of the lungs, as well as inspiratory and expiratory imaging, was performed.  COMPARISON:  07/06/2016, PET 05/14/2015 and CT chest 01/12/2015.  FINDINGS: Cardiovascular: Coronary artery calcification. Heart is at the upper limits of normal in size to mildly enlarged. No pericardial effusion.  Mediastinum/Nodes: Mediastinal lymph nodes measure up to 1.2 cm in the low right paratracheal station, as before. Hilar regions are difficult to evaluate without IV contrast. No axillary adenopathy. Mild esophageal dilatation. Esophagus is otherwise unremarkable.  Lungs/Pleura: Worsening peripheral and basilar predominant subpleural reticulation, ground-glass, traction bronchiectasis/bronchiolectasis and probable honeycombing. No air trapping. Perifissural nodules measure up to 8 x 13 mm along the left major fissure, minimally increased from 07/06/2016 with still felt to represent subpleural lymph nodes. No pleural fluid. Airway is unremarkable.  Upper Abdomen: Visualized portion of the liver is unremarkable. Stones layer in the gallbladder. Visualized portions of the adrenal glands, kidneys, spleen, pancreas, stomach and bowel are grossly unremarkable. There may be [name] small hiatal hernia. Upper abdominal lymph nodes are not enlarged by CT size criteria.  Musculoskeletal: Degenerative changes in the spine.  IMPRESSION: 1. Progressive pulmonary parenchymal pattern of fibrosis,  as described above. Findings are consistent with UIP per consensus guidelines: Diagnosis of Idiopathic Pulmonary Fibrosis: An Official ATS/ERS/JRS/ALAT Clinical Practice Guideline. [place], Iss 5, [id number], Feb 11 2017. 2. Cholelithiasis. 3. Coronary artery calcification.   ROS  OV 06/18/2019  Subjective:  Patient ID: Erica [name], female , DOB: 06-02-1956 , age 43 y.o. 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 Follow-up    cough, mucus is tan     [name] 64 y.o. -follow-up for her chest CT and PFT results.  In the interim denies any complaints.  Her symptom score is listed below.  She does not have any chest pain with exertion.  In talking to she only reports the symptoms.  I had to press her multiple different ways to really know if she understands what is going on in terms of etiologies.  After much conversation she currently at her COPD.  She had no idea about pulmonary fibrosis or interstitial lung disease.  I used several different colloquial terms.  This has been explained to her in the past.  But she understands she is got progressive shortness of breath and this is due to her lung disease.  So we went over the concept of pulmonary fibrosis again.  Her CT shows progressive UIP pattern of pulmonary fibrosis.  His previous serologies are negative.  She has coronary artery calcification and emphysema.  Her symptom burden is listed below.  Her walking desaturation test shows progression.  She is not had [name] stress test in [name] long time.  She does not have any chest pains  She says she had overnight oxygen study but we do not have those results with [place].   SYMPTOM SCALE - ILD 06/18/2019 188#  O2 use ra  Shortness  of Breath 0 -> 5 scale with 5 being worst (score 6 If unable to do)  At rest 1  Simple tasks - showers, clothes change, eating, shaving 2  Household (dishes, doing bed, laundry) 2  Shopping 2  Walking level at own  pace 2  Walking keeping up with others of same age 68  Walking up Stairs 3  Walking up Hill 3  Total (40 - 48) Dyspnea Score 28  How bad is your cough? 4  How bad is your fatigue 4         COMPARISON:  High-resolution chest CT 10/2 01/2018.   FINDINGS: Cardiovascular: Heart size is normal. There is no significant pericardial fluid, thickening or pericardial calcification. There is aortic atherosclerosis, as well as atherosclerosis of the great vessels of the mediastinum and the coronary arteries, including calcified atherosclerotic plaque in the left anterior descending coronary arteries.  Mediastinum/Nodes: No pathologically enlarged mediastinal or hilar lymph nodes. Please note that accurate exclusion of hilar adenopathy is limited on noncontrast CT scans. Esophagus is unremarkable in appearance. No axillary lymphadenopathy.  Lungs/Pleura: High-resolution images demonstrate widespread patchy areas of ground-glass attenuation, septal thickening, thickening of the peribronchovascular interstitium, cylindrical bronchiectasis, peripheral bronchiolectasis and areas of honeycombing. These findings have [name] definitive craniocaudal gradient and arm minimally progressive compared to the prior study. Inspiratory and expiratory imaging is unremarkable. No acute consolidative airspace disease. No pleural effusions. No suspicious appearing pulmonary nodules or masses are noted.  Upper Abdomen: Partially calcified gallstone in the neck of the gallbladder.  Musculoskeletal: There are no aggressive appearing lytic or blastic lesions noted in the visualized portions of the skeleton.  IMPRESSION: 1. The appearance of the lungs remains compatible with interstitial lung  disease, with [name] spectrum of findings considered diagnostic of usual interstitial pneumonia (UIP) per current ATS guidelines. Minimal progression compared to the prior study. 2. Aortic atherosclerosis, in addition to left anterior descending coronary artery disease. Please note that although the presence of coronary artery calcium documents the presence of coronary artery disease, the severity of this disease and any potential stenosis cannot be assessed on this non-gated CT examination. Assessment for potential risk factor modification, dietary therapy or pharmacologic therapy may be warranted, if clinically indicated. 3. Cholelithiasis.  Aortic Atherosclerosis (ICD10-I70.0).   Electronically Signed   By: Vinnie Langton M.D.   On: 04/12/2019 16:30  xxxxxxxxxxxxxxxxxxxxxxxxxxxxxxxxxxxxxxxxxxxxxxxxxxxxxxxxxxxx  Simple office walk 185 feet x  3 laps goal with forehead probe 10/24/2017  04/08/2019  06/18/2019 188#  O2 used Room air Room air Room air  Number laps completed _0 Comments about pace Normal pace    Resting Pulse Ox/HR 97% and 77/min 100% 95% and 91/min  Final Pulse Ox/HR 97% and 103/min 97% and 105/min 90% and 106/min  Desaturated </= 88% no no no  Desaturated <= 3% points no Yes, 3 points Yes, 3 poitns  Got Tachycardic >/= 90/min yes yes yes  Symptoms at end of test No comment x No dyspnea  Miscellaneous comments No comment from CMA x x     [name], Erica [name] (MRN [id number]) as of 06/18/2019 12:12  Ref. Range 08/11/2016 10:13 10/24/2017 14:58 06/13/2019 08:56  FVC-Pre Latest Units: L 2.56 2.79 2.52  FVC-%Pred-Pre Latest Units: % 98 108 98    [name], Erica [name] (MRN [id number]) as of 06/18/2019 12:12  Ref. Range 08/11/2016 10:13 10/24/2017 14:58 06/13/2019 08:56  DLCO unc Latest Units: ml/min/mmHg 14.81  7.70  DLCO unc % pred Latest Units: % 61  38    ROS - per HPI     has [name] past medical history of Arthritis, Asthma, Depression, Diabetes  mellitus, GERD (gastroesophageal reflux disease), Headache(784.0), Hypertension, Mental disorder, Neuropathy, Pain, and Rectal polyp.   reports that she quit  smoking about 17 months ago. Her smoking use included cigarettes. She has [name] 10.00 pack-year smoking history. She has never used smokeless tobacco.  Past Surgical History:  Procedure Laterality Date  . ANAL RECTAL MANOMETRY N/[name] 07/13/2016   Procedure: ANO RECTAL MANOMETRY;  Surgeon: Leighton Ruff, MD;  Location: [facility];  Service: Endoscopy;  Laterality: N/[name];  . CESAREAN SECTION    . EUS N/[name] 11/21/2012   Procedure: LOWER ENDOSCOPIC ULTRASOUND (EUS);  Surgeon: Arta Silence, MD;  Location: [facility];  Service: Endoscopy;  Laterality: N/[name];  . FLEXIBLE SIGMOIDOSCOPY N/[name] 11/21/2012   Procedure: FLEXIBLE SIGMOIDOSCOPY;  Surgeon: Arta Silence, MD;  Location: [facility];  Service: Endoscopy;  Laterality: N/[name];  . FLEXIBLE SIGMOIDOSCOPY N/[name] 01/28/2013   Procedure: FLEXIBLE SIGMOIDOSCOPY;  Surgeon: Leighton Ruff, MD;  Location: [facility];  Service: Endoscopy;  Laterality: N/[name];  . LAPAROSCOPIC LOW ANTERIOR RESECTION N/[name] 01/29/2013   Procedure: LAPAROSCOPIC LOW ANTERIOR RESECTION, Rigid Proctoscopy;  Surgeon: Leighton Ruff, MD;  Location: [facility];  Service: General;  Laterality: N/[name];  . LAPAROSCOPIC SIGMOID COLECTOMY N/[name] 11/14/2012   Procedure: DIAGNOSTIC LAPAROSCOPY AND SIGMOIDMOIDOSCOPY ;  Surgeon: Rolm Bookbinder, MD;  Location: [facility];  Service: General;  Laterality: N/[name];  . RECTAL ULTRASOUND N/[name] 07/13/2016   Procedure: RECTAL ULTRASOUND;  Surgeon: Leighton Ruff, MD;  Location: [facility];  Service: Endoscopy;  Laterality: N/[name];  . TONSILLECTOMY    . TONSILLECTOMY AND ADENOIDECTOMY      Allergies  Allergen Reactions  . Penicillins Hives, Itching and Swelling    Tongue swells up Has patient had [name] PCN reaction causing immediate rash, facial/tongue/throat swelling, SOB or lightheadedness with hypotension: Yes Has patient had [name] PCN  reaction causing severe rash involving mucus membranes or skin necrosis: Yes Has patient had [name] PCN reaction that required hospitalization Yes Has patient had [name] PCN reaction occurring within the last 10 years: No If all of the above answers are "NO", then may proceed with Cephalosporin use.     Immunization History  Administered Date(s) Administered  . Influenza Split 03/13/2017  . Influenza Whole 04/26/2018  . Influenza,inj,Quad PF,6+ Mos 03/09/2019  . Influenza-Unspecified 03/13/2016    Family History  Problem Relation Age of Onset  . Hypertension Father   . Stroke Father   . Diabetes Father   . Heart disease Mother   . Hypertension Mother   . Hyperlipidemia Mother   . Hypertension Sister   . Hypertension Brother   . Hypertension Sister   . Hypertension Brother   . Cancer Other 47       colon cancer   . Cancer Maternal Aunt        cancer, unknown type   . Cancer Maternal Uncle        bone cancer   . Cancer Paternal Aunt        lung cancer  . Cancer Paternal Uncle        lung cancer  . Cancer Maternal Uncle        colon cancer and prostate cancer   . Cancer Maternal Uncle        prostate cancer      Current Outpatient Medications:  .  albuterol (PROVENTIL HFA;VENTOLIN HFA) 108 (90 BASE) MCG/ACT inhaler, Inhale 2 puffs into the lungs every 4 (four) hours as needed for wheezing., Disp: , Rfl:  .  albuterol (PROVENTIL) (2.5 MG/3ML) 0.083% nebulizer solution, Take 3 mLs (2.5 mg total) by nebulization every 6 (six) hours as needed for wheezing or shortness of breath., Disp:  75 mL, Rfl: 12 .  amLODipine (NORVASC) 10 MG tablet, Take 10 mg by mouth daily. , Disp: , Rfl: 2 .  gabapentin (NEURONTIN) 300 MG capsule, Take 300 mg by mouth 3 (three) times daily. , Disp: , Rfl:  .  hydrochlorothiazide (MICROZIDE) 12.5 MG capsule, Take 1 capsule (12.5 mg total) by mouth daily., Disp: 30 capsule, Rfl: 0 .  losartan (COZAAR) 50 MG tablet, Take 1 tablet (50 mg total) by mouth daily.,  Disp: 30 tablet, Rfl: 0 .  losartan-hydrochlorothiazide (HYZAAR) 50-12.5 MG tablet, Take 1 tablet by mouth daily., Disp: , Rfl:  .  montelukast (SINGULAIR) 10 MG tablet, Take 1 tablet (10 mg total) by mouth at bedtime., Disp: 30 tablet, Rfl: 5 .  Multiple Vitamin (MULTIVITAMIN WITH MINERALS) TABS tablet, Take 1 tablet by mouth daily., Disp: , Rfl:  .  nortriptyline (PAMELOR) 25 MG capsule, Take 25 mg by mouth at bedtime as needed for sleep. , Disp: , Rfl: 5 .  pantoprazole (PROTONIX) 40 MG tablet, Take 40 mg by mouth daily., Disp: , Rfl:  .  sertraline (ZOLOFT) 50 MG tablet, Take 1 tablet (50 mg total) by mouth daily., Disp: 30 tablet, Rfl: 0 .  simvastatin (ZOCOR) 10 MG tablet, Take 10 mg by mouth daily. , Disp: , Rfl: 5 .  SYMBICORT 160-4.5 MCG/ACT inhaler, Inhale 2 puffs into the lungs 2 (two) times daily. , Disp: , Rfl:  .  Tiotropium Bromide Monohydrate (SPIRIVA RESPIMAT) 1.25 MCG/ACT AERS, Inhale 2 puffs into the lungs daily., Disp: 24 g, Rfl: 0 .  TOUJEO SOLOSTAR 300 UNIT/ML SOPN, Inject 100 Units into the skin at bedtime. , Disp: , Rfl: 1 .  traMADol (ULTRAM) 50 MG tablet, Take 50 mg by mouth 2 (two) times daily as needed for moderate pain. , Disp: , Rfl:       Objective:   Vitals:   06/18/19 1213  BP: (!) 154/84  Pulse: 91  Temp: 97.8 F (36.6 C)  TempSrc: Temporal  SpO2: 95%  Weight: 192 lb 12.8 oz (87.5 kg)  Height: _0  (1.6 m)    Estimated body mass index is 34.15 kg/m as calculated from the following:   Height as of this encounter: _1  (1.6 m).   Weight as of this encounter: 192 lb 12.8 oz (87.5 kg).  _2 @  [employer]   06/18/19 1213  Weight: 192 lb 12.8 oz (87.5 kg)     Physical Exam  General Appearance:    Alert, cooperative, no distress, appears stated age - yes , Deconditioned looking - yes , OBESE  - yes, Sitting on Wheelchair -  no  Head:    Normocephalic, without obvious abnormality, atraumatic  Eyes:    PERRL, conjunctiva/corneas  clear,  Ears:    Normal TM's and external ear canals, both ears  Nose:   Nares normal, septum midline, mucosa normal, no drainage    or sinus tenderness. OXYGEN ON  - no . Patient is @ ra   Throat:   Lips, mucosa, and tongue normal; teeth and gums normal. Cyanosis on lips - no  Neck:   Supple, symmetrical, trachea midline, no adenopathy;    thyroid:  no enlargement/tenderness/nodules; no carotid   bruit or JVD  Back:     Symmetric, no curvature, ROM normal, no CVA tenderness  Lungs:     Distress - no , Wheeze no, Barrell Chest - no, Purse lip breathing - no, Crackles - yes mild at base   Chest Wall:  No tenderness or deformity.    Heart:    Regular rate and rhythm, S1 and S2 normal, no rub   or gallop, Murmur - no  Breast Exam:    NOT DONE  Abdomen:     Soft, non-tender, bowel sounds active all four quadrants,    no masses, no organomegaly. Visceral obesity - yes  Genitalia:   NOT DONE  Rectal:   NOT DONE  Extremities:   Extremities - normal, Has Cane - no, Clubbing - no, Edema - no  Pulses:   2+ and symmetric all extremities  Skin:   Stigmata of Connective Tissue Disease - no  Lymph nodes:   Cervical, supraclavicular, and axillary nodes normal  Psychiatric:  Neurologic:   Pleasant - yes, Anxious - no, Flat affect - mild yes  CAm-ICU - neg, Alert and Oriented x 3 - yes, Moves all 4s - yes, Speech - normal, Cognition - intact           Assessment:       ICD-10-CM   1. Coronary artery calcification  I25.10 Ambulatory referral to Cardiology   I25.84 CANCELED: Ambulatory referral to Cardiology  2. ILD (interstitial lung disease) (HCC)  J84.9 Pulse oximetry, overnight  3. IPF (idiopathic pulmonary fibrosis) ([facility])  J84.112   4. 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  Specifically discussed the variety of pulmonary fibrosis is idiopathic pulmonary fibrosis which is progressive.  She is already getting worse.   Explained the multimodal approach of treatment.  Today focused on antifibrotic discussion.  Explained to her in the future we will have to discuss about role of rehab, clinical trials, transplant and weight loss.  In this visit we extensively discussed pirfenidone.  Discussed the GI side effects.  We discussed the choice between the 2 agents the other one being nintedanib.  She prefers nausea and vomiting and weight loss and anorexia as [name] side effect as opposed to diarrhea.  She is agreeable to wear sunscreen.  She is agreeable to take it diligently and compliant.  She will monitor her liver function test.  Advise close monitoring.    Plan:     Patient Instructions     ICD-10-CM   1. Coronary artery calcification  I25.10 Ambulatory referral to Cardiology   I25.84 CANCELED: Ambulatory referral to Cardiology  2. ILD (interstitial lung disease) (HCC)  J84.9 Pulse oximetry, overnight  3. IPF (idiopathic pulmonary fibrosis) ([facility])  J84.112   4. Panlobular emphysema (HCC)  J43.1      #Coronary artery calcification  Plan -Refer Dr. Christen Butter of [facility] cardiovascular   #Interstitial lung disease -The specific variety of interstitial lung disease otherwise call pulmonary fibrosis that you have is called IPF [idiopathic pulmonary fibrosis]  -This is [name] progressive disease which means it will get worse with time  -In your case it is already getting worse  Plan  -Start [name] medicine called Esbriet  -Close monitoring is required  -We discussed all the side effects  - Test overnight oxygen on room air at home  -Consider getting pneumonia vaccine today Pneumovax  - Please talk to PCP Nolene Ebbs, MD -  and ensure you get  shingrix ([place]) inactivated vaccine against shingles  -COVID-19 vaccine if you are eligible but is side effects might make you ineligible.  #Emphysema  -Currently stable  Plan -Continue Spiriva, Symbicort and Singulair   Follow-up -Repeat liver function test in 4-6  weeks to ensure  you are tolerating Esbriet okay -Return in 4-6 weeks to interstitial lung disease clinic or 30-minute slot in the regular clinic  (Level 04: 45-59 min visit spent in  in-site visit  in total care time and counseling or/and coordination of care by this undersigned MD - Dr Brand Males. This includes one or more of the following on this same day 06/18/2019: pre-charting, chart review, note writing, documentation discussion of test results, diagnostic or treatment recommendations, prognosis, risks and benefits of management options, instructions, education, compliance or risk-factor reduction. It excludes time spent by the [place] or office staff in the care of the patient)    SIGNATURE    Dr. Brand Males, M.D., F.C.C.P,  Pulmonary and Critical Care Medicine Staff Physician, [place] Director - Interstitial Lung Disease  Program  Pulmonary [place] at [place], [place], [address]  Pager: [phone], If no answer or between  15:00h - 7:00h: call [phone] Telephone: [phone]  12:51 PM 06/18/2019

## 2019-06-18 NOTE — Patient Instructions (Addendum)
ICD-10-CM   1. Coronary artery calcification  I25.10 Ambulatory referral to Cardiology   I25.84 CANCELED: Ambulatory referral to Cardiology  2. ILD (interstitial lung disease) (HCC)  J84.9 Pulse oximetry, overnight  3. IPF (idiopathic pulmonary fibrosis) ([place])  J84.112   4. Panlobular emphysema (HCC)  J43.1      #Coronary artery calcification  Plan -Refer Dr. Christen Butter of [facility] cardiovascular   #Interstitial lung disease -The specific variety of interstitial lung disease otherwise call pulmonary fibrosis that you have is called IPF [idiopathic pulmonary fibrosis]  -This is a progressive disease which means it will get worse with time  -In your case it is already getting worse  Plan  -Start a medicine called Esbriet  -Close monitoring is required  -We discussed all the side effects  - WILL CHASE ONO RESULT FROM AROUND XMAS 2020  -Consider getting pneumonia vaccine today Pneumovax  - Please talk to PCP Nolene Ebbs, MD -  and ensure you get  shingrix ([place]) inactivated vaccine against shingles  -COVID-19 vaccine if you are eligible but is side effects might make you ineligible.  #Emphysema  -Currently stable  Plan -Continue Spiriva, Symbicort and Singulair   Follow-up -Repeat liver function test in 4-6 weeks to ensure you are tolerating Esbriet okay -Return in 4-6 weeks to interstitial lung disease clinic or 30-minute slot in the regular clinic

## 2019-06-24 ENCOUNTER — Ambulatory Visit: Payer: Medicare HMO | Admitting: Podiatry

## 2019-06-26 ENCOUNTER — Telehealth: Payer: Self-pay | Admitting: Internal Medicine

## 2019-06-26 ENCOUNTER — Telehealth: Payer: Self-pay | Admitting: Pharmacy Technician

## 2019-06-26 DIAGNOSIS — J84112 Idiopathic pulmonary fibrosis: Secondary | ICD-10-CM

## 2019-06-26 MED ORDER — ESBRIET 267 MG PO TABS: ORAL_TABLET | ORAL | 0 refills | Status: DC

## 2019-06-26 NOTE — Telephone Encounter (Signed)
Received notification from [facility] regarding a prior authorization for ESBRIET. Authorization has been APPROVED from 06/14/19 to 06/12/20.   Will send document to scan center.  Authorization # [id number] Phone # [phone]  Ran test claim, patient's copay for 1 month supply is $4.00. Patient can fill through [facility].  12:45 PM Beatriz Chancellor, CPhT

## 2019-06-26 NOTE — Telephone Encounter (Signed)
Called [employer] and spoke with Janette. Per Madlyn Frankel, the form that was sent for pt's new Esbriet start cannot be accepted due to a typo on the form. A new form was faxed over to the office which I stated to [facility] that we would be on a lookout for.  I asked Janette if the prescriber's portion was all that was needed to be fixed and she stated that they have tried to reach out to the pt but have not been able to get in touch with her. They will contact the pt one more time on 1/18 to see if they are able to reach her then.  I did give pt's forms that we had faxed to [employer] to Rachael, so I am going to route this to her as an FYI about what has happened.

## 2019-06-26 NOTE — Telephone Encounter (Signed)
Received [employer].   Submitted a Prior Authorization request to [facility] for [place] via Cover My Meds. Will update once we receive a response.

## 2019-06-26 NOTE — Telephone Encounter (Signed)
Prescription sent to [facility].  Will add to monitoring system and set up first shipment as well as provide Esbriet counseling.   Mariella Saa, PharmD, [place], [place] [facility] Pharmacist [phone]  06/26/2019 2:28 PM

## 2019-06-27 ENCOUNTER — Other Ambulatory Visit: Payer: Self-pay

## 2019-06-27 ENCOUNTER — Encounter: Payer: Self-pay | Admitting: Podiatry

## 2019-06-27 ENCOUNTER — Ambulatory Visit (INDEPENDENT_AMBULATORY_CARE_PROVIDER_SITE_OTHER): Payer: Medicare HMO | Admitting: Podiatry

## 2019-06-27 DIAGNOSIS — L600 Ingrowing nail: Secondary | ICD-10-CM | POA: Diagnosis not present

## 2019-06-27 DIAGNOSIS — L03031 Cellulitis of right toe: Secondary | ICD-10-CM

## 2019-06-27 NOTE — Telephone Encounter (Signed)
Pharmacy team has completed Benefits investigation and has triaged patient to [facility]. Patient had Medicare and Medicaid, no patient assistance is needed.

## 2019-06-28 NOTE — Progress Notes (Signed)
Subjective:   Patient ID: Erica [name], female   DOB: 64 y.o.   MRN: [id number]   HPI Patient presents with the medial border of the right hallux being irritated states the rest the nail is doing pretty well but this 1 small border she cannot cut herself or take care of it she like to see if it can be fixed permanently.  Patient states that her sugar has been under good control and she has been taking care of herself currently    ROS      Objective:  Physical Exam  Neurovascular status was found to be intact with good digital flow.  I noted on the right hallux medial side there is incurvation with a nail spicule that is painful with no active drainage or redness associated with it     Assessment:  Nail spicule with ingrown toenail component right medial border that does not appear to be infected     Plan:  H&P reviewed condition and recommended removal of the spicule.  I explained procedure risk and patient wants this done and signed consent form.  I infiltrated the right hallux 60 mg Xylocaine Marcaine mixture remove the medial spicule and exposed the matrix and applied chemical agent of phenol 3 applications 30 seconds followed by alcohol lavage sterile dressing.  Give instructions on soaks reappoint

## 2019-06-28 NOTE — Telephone Encounter (Signed)
Patient's 1st Esbriet shipment has been scheduled from [facility] on 1/18 and will deliver on 1/19

## 2019-07-01 MED FILL — ESBRIET 267 MG TABS: 267 | 30 days supply | Qty: 207 | Fill #0

## 2019-07-02 NOTE — Telephone Encounter (Signed)
Erica [name] please advise if this message can be closed. Thanks.

## 2019-07-08 ENCOUNTER — Other Ambulatory Visit: Payer: Self-pay

## 2019-07-08 ENCOUNTER — Ambulatory Visit (INDEPENDENT_AMBULATORY_CARE_PROVIDER_SITE_OTHER): Payer: Medicare HMO | Admitting: Cardiology

## 2019-07-08 ENCOUNTER — Other Ambulatory Visit: Payer: Self-pay | Admitting: Cardiology

## 2019-07-08 ENCOUNTER — Encounter: Payer: Self-pay | Admitting: Cardiology

## 2019-07-08 VITALS — BP 163/92 | HR 82 | Temp 98.4°F | Ht 63.0 in | Wt 198.3 lb

## 2019-07-08 DIAGNOSIS — R0600 Dyspnea, unspecified: Secondary | ICD-10-CM | POA: Diagnosis not present

## 2019-07-08 DIAGNOSIS — I10 Essential (primary) hypertension: Secondary | ICD-10-CM | POA: Diagnosis not present

## 2019-07-08 DIAGNOSIS — I2510 Atherosclerotic heart disease of native coronary artery without angina pectoris: Secondary | ICD-10-CM

## 2019-07-08 DIAGNOSIS — Z87898 Personal history of other specified conditions: Secondary | ICD-10-CM

## 2019-07-08 DIAGNOSIS — Z87891 Personal history of nicotine dependence: Secondary | ICD-10-CM | POA: Diagnosis not present

## 2019-07-08 DIAGNOSIS — J439 Emphysema, unspecified: Secondary | ICD-10-CM

## 2019-07-08 DIAGNOSIS — R0609 Other forms of dyspnea: Secondary | ICD-10-CM

## 2019-07-08 DIAGNOSIS — F1491 Cocaine use, unspecified, in remission: Secondary | ICD-10-CM

## 2019-07-08 MED ORDER — METOPROLOL SUCCINATE ER 50 MG PO TB24: 50.0000 mg | ORAL_TABLET | Freq: Every day | ORAL | 1 refills | Status: DC

## 2019-07-08 NOTE — Progress Notes (Signed)
Primary Physician/Referring:  Nolene Ebbs, MD  Patient ID: Erica [name], female    DOB: 1955-07-22, 64 y.o.   MRN: [id number]  Chief Complaint  Patient presents with  . Establish Care  . Other    Abnormal CT scan of chest   HPI:    HPI: Erica [name]  is a 64 y.o. AAF with rectal CA in 2014 that is in remission, T2DM, former tobacco use, emphysema, interstitial lung disease, idiopathic pulmonary fibrosis, referred to [place] for evaluation of coronary artery calcification seen on CT scan by Dr. Chase Caller.  She has chronic dyspnea on exertion that is progressively worsening. No exertional chest pain. She has gained 20 lbs over the last 6 months. Denies any claudication symptoms.  Diabetes is uncontrolled. She has not seen her PCP recently, but does do virtual visits.  She quit smoking in May 2020. No alcohol use. She formerly used cocaine, but has been quit since 05/06/2019.  Past Medical History:  Diagnosis Date  . Arthritis    especially in shoulders  . Asthma   . Depression   . Diabetes mellitus   . GERD (gastroesophageal reflux disease)   . Headache(784.0)    "mild"  . Hypertension   . Mental disorder    depression  . Neuropathy    feet   . Pain    arthritis pain - takes tramadol as needed  . Rectal polyp    very little bleeding with bowel movements- no pain   Past Surgical History:  Procedure Laterality Date  . ANAL RECTAL MANOMETRY N/A 07/13/2016   Procedure: ANO RECTAL MANOMETRY;  Surgeon: Leighton Ruff, MD;  Location: [facility] ENDOSCOPY;  Service: Endoscopy;  Laterality: N/A;  . CESAREAN SECTION    . EUS N/A 11/21/2012   Procedure: LOWER ENDOSCOPIC ULTRASOUND (EUS);  Surgeon: Arta Silence, MD;  Location: [facility];  Service: Endoscopy;  Laterality: N/A;  . FLEXIBLE SIGMOIDOSCOPY N/A 11/21/2012   Procedure: FLEXIBLE SIGMOIDOSCOPY;  Surgeon: Arta Silence, MD;  Location: [facility];  Service: Endoscopy;  Laterality: N/A;  . FLEXIBLE SIGMOIDOSCOPY N/A 01/28/2013   Procedure: FLEXIBLE SIGMOIDOSCOPY;  Surgeon: Leighton Ruff, MD;  Location: [facility];  Service: Endoscopy;  Laterality: N/A;  . LAPAROSCOPIC LOW ANTERIOR RESECTION N/A 01/29/2013   Procedure: LAPAROSCOPIC LOW ANTERIOR RESECTION, Rigid Proctoscopy;  Surgeon: Leighton Ruff, MD;  Location: [facility] ORS;  Service: General;  Laterality: N/A;  . LAPAROSCOPIC SIGMOID COLECTOMY N/A 11/14/2012   Procedure: DIAGNOSTIC LAPAROSCOPY AND SIGMOIDMOIDOSCOPY ;  Surgeon: Rolm Bookbinder, MD;  Location: [facility];  Service: General;  Laterality: N/A;  . RECTAL ULTRASOUND N/A 07/13/2016   Procedure: RECTAL ULTRASOUND;  Surgeon: Leighton Ruff, MD;  Location: [facility] END[facility];  Service: Endoscopy;  Laterality: N/A;  . TONSILLECTOMY    . TONSILLECTOMY AND ADENOIDECTOMY     Social History   Socioeconomic History  . Marital status: Single    Spouse name: Not on file  . Number of children: 1  . Years of education: Not on file  . Highest education level: Not on file  Occupational History  . Not on file  Tobacco Use  . Smoking status: Former Smoker    Packs/day: 0.50    Years: 20.00    Pack years: 10.00    Types: Cigarettes    Quit date: 01/05/2018    Years since quitting: 1.5  . Smokeless tobacco: Never Used  . Tobacco comment: Unknown   Substance and Sexual Activity  . Alcohol use: Not Currently  . Drug use: Yes  Types: "Crack" cocaine    Comment: Unknown   . Sexual activity: Never    Birth control/protection: None  Other Topics Concern  . Not on file  Social History Narrative  . Not on file   Social Determinants of Health   Financial Resource Strain:   . Difficulty of Paying Living Expenses: Not on file  Food Insecurity:   . Worried About Charity fundraiser in the Last Year: Not on file  . Ran Out of Food in the Last Year: Not on file  Transportation Needs:   . Lack of Transportation (Medical): Not on file  . Lack of Transportation (Non-Medical): Not on file  Physical Activity:   . Days of Exercise  per Week: Not on file  . Minutes of Exercise per Session: Not on file  Stress:   . Feeling of Stress : Not on file  Social Connections:   . Frequency of Communication with Friends and Family: Not on file  . Frequency of Social Gatherings with Friends and Family: Not on file  . Attends Religious Services: Not on file  . Active Member of Clubs or Organizations: Not on file  . Attends Archivist Meetings: Not on file  . Marital Status: Not on file  Intimate Partner Violence:   . Fear of Current or Ex-Partner: Not on file  . Emotionally Abused: Not on file  . Physically Abused: Not on file  . Sexually Abused: Not on file   ROS  Review of Systems  Constitution: Positive for weight gain. Negative for decreased appetite, malaise/fatigue and weight loss.  Eyes: Negative for visual disturbance.  Cardiovascular: Positive for dyspnea on exertion. Negative for chest pain, claudication, leg swelling, orthopnea, palpitations and syncope.  Respiratory: Negative for hemoptysis and wheezing.   Endocrine: Negative for cold intolerance and heat intolerance.  Hematologic/Lymphatic: Does not bruise/bleed easily.  Skin: Negative for nail changes.  Musculoskeletal: Negative for muscle weakness and myalgias.  Gastrointestinal: Negative for abdominal pain, change in bowel habit, nausea and vomiting.  Neurological: Negative for difficulty with concentration, dizziness, focal weakness and headaches.  Psychiatric/Behavioral: Negative for altered mental status and suicidal ideas.  All other systems reviewed and are negative.  Objective  Blood pressure (!) 163/92, pulse 82, temperature 98.4 F (36.9 C), height _0  (1.6 m), weight 198 lb 4.8 oz (89.9 kg), SpO2 92 %. Body mass index is 35.13 kg/m.   Physical Exam  Constitutional: She is oriented to person, place, and time. Vital signs are normal. She appears well-developed and well-nourished.  Short statured and mod obese  HENT:  Head:  Normocephalic and atraumatic.  Cardiovascular: Normal rate, regular rhythm, normal heart sounds and intact distal pulses.  Pulmonary/Chest: Effort normal and breath sounds normal. No accessory muscle usage. No respiratory distress.  Abdominal: Soft. Bowel sounds are normal.  Musculoskeletal:        General: Normal range of motion.     Cervical back: Normal range of motion.  Neurological: She is alert and oriented to person, place, and time.  Skin: Skin is warm and dry.  Vitals reviewed.  Radiology:  CT of chest 04/12/2019:  1. The appearance of the lungs remains compatible with interstitial lung disease, with a spectrum of findings considered diagnostic of usual interstitial pneumonia (UIP) per current ATS guidelines. Minimal progression compared to the prior study. 2. Aortic atherosclerosis, in addition to left anterior descending coronary artery disease. Please note that although the presence of coronary artery calcium documents the presence of coronary  artery disease, the severity of this disease and any potential stenosis cannot be assessed on this non-gated CT examination. Assessment for potential risk factor modification, dietary therapy or pharmacologic therapy may be warranted, if clinically indicated. 3. Cholelithiasis.  Aortic Atherosclerosis (ICD10-I70.0).  Laboratory examination:   Recent Labs    08/29/18 0915 05/09/19 0133 05/11/19 2220  NA 139 130* 131*  K 4.0 4.3 5.0  CL 109 101 101  CO2 21* 17* 22  GLUCOSE 189* 494* 502*  BUN 19 32* 35*  CREATININE 1.38* 1.51* 1.69*  CALCIUM 8.2* 8.5* 8.7*  GFRNONAA 40* 36* 32*  GFRAA 47* 42* 37*   CMP Latest Ref Rng & Units 05/11/2019 05/09/2019 08/29/2018  Glucose 70 - 99 mg/dL 502(HH) 494(H) 189(H)  BUN 8 - 23 mg/dL 35(H) 32(H) 19  Creatinine 0.44 - 1.00 mg/dL 1.69(H) 1.51(H) 1.38(H)  Sodium 135 - 145 mmol/L 131(L) 130(L) 139  Potassium 3.5 - 5.1 mmol/L 5.0 4.3 4.0  Chloride 98 - 111 mmol/L 101 101 109  CO2 22  - 32 mmol/L 22 17(L) 21(L)  Calcium 8.9 - 10.3 mg/dL 8.7(L) 8.5(L) 8.2(L)  Total Protein 6.5 - 8.1 g/dL 7.6 8.0 8.0  Total Bilirubin 0.3 - 1.2 mg/dL 1.0 1.2 1.2  Alkaline Phos 38 - 126 U/L 110 132(H) 124  AST 15 - 41 U/L _0 ALT 0 - 44 U/L _1 CBC Latest Ref Rng & Units 05/09/2019 08/29/2018 08/30/2017  WBC 4.0 - 10.5 K/uL 6.6 4.2 4.5  Hemoglobin 12.0 - 15.0 g/dL 11.5(L) 11.0(L) 11.0(L)  Hematocrit 36.0 - 46.0 % 32.7(L) 32.0(L) 31.0(L)  Platelets 150 - 400 K/uL 181 171 166   Lipid Panel     Component Value Date/Time   CHOL 148 08/21/2008 2129   TRIG 127 08/21/2008 2129   HDL 56 08/21/2008 2129   CHOLHDL 2.6 Ratio 08/21/2008 2129   VLDL 25 08/21/2008 2129   LDLCALC 67 08/21/2008 2129   HEMOGLOBIN A1C Lab Results  Component Value Date   HGBA1C 7.9 (H) 05/11/2019   MPG 180.03 05/11/2019   TSH No results for input(s): TSH in the last 8760 hours. Medications   Current Outpatient Medications  Medication Instructions  . albuterol (PROVENTIL HFA;VENTOLIN HFA) 108 (90 BASE) MCG/ACT inhaler 2 puffs, Every 4 hours PRN  . albuterol (PROVENTIL) 2.5 mg, Nebulization, Every 6 hours PRN  . amLODipine (NORVASC) 10 mg, Oral, Daily  . gabapentin (NEURONTIN) 300 mg, Oral, 3 times daily  . hydrochlorothiazide (MICROZIDE) 12.5 mg, Oral, Daily  . losartan (COZAAR) 50 mg, Oral, Daily  . metoprolol succinate (TOPROL-XL) 50 MG 24 hr tablet TAKE 1 TABLET(50 MG) BY MOUTH DAILY WITH OR IMMEDIATELY FOLLOWING A MEAL  . montelukast (SINGULAIR) 10 mg, Oral, Daily at bedtime  . Multiple Vitamin (MULTIVITAMIN WITH MINERALS) TABS tablet 1 tablet, Oral, Daily  . nortriptyline (PAMELOR) 25 mg, Oral, At bedtime PRN  . pantoprazole (PROTONIX) 40 mg, Daily  . Pirfenidone (ESBRIET) 267 MG TABS Take 1 tablet (267 mg total) by mouth 3 (three) times daily for 7 days, THEN 2 tablets (534 mg total) 3 (three) times daily for 7 days, THEN 3 tablets (801 mg total) 3 (three) times daily for 16 days.  Marland Kitchen  sertraline (ZOLOFT) 50 mg, Oral, Daily  . simvastatin (ZOCOR) 10 mg, Oral, Daily  . SYMBICORT 160-4.5 MCG/ACT inhaler 2 puffs, Inhalation, 2 times daily  . Tiotropium Bromide Monohydrate (SPIRIVA RESPIMAT) 1.25 MCG/ACT AERS 2 puffs, Inhalation, Daily  . Toujeo SoloStar 100 Units,  Subcutaneous, Daily at bedtime  . traMADol (ULTRAM) 50 mg, Oral, 2 times daily PRN    Cardiac Studies:   none  Assessment     ICD-10-CM   1. Coronary artery calcification seen on CT scan  I25.10 Lipid Profile    PCV MYOCARDIAL PERFUSION WITH LEXISCAN  2. Essential hypertension  I10 EKG 12-Lead    PCV ECHOCARDIOGRAM COMPLETE  3. Dyspnea on exertion  R06.00 PCV ECHOCARDIOGRAM COMPLETE  4. Former tobacco use  Z87.891   5. Pulmonary emphysema, unspecified emphysema type ([place])  J43.9   6. History of cocaine use  Z87.898     EKG 07/08/2019: Normal sinus rhythm at 70 bpm, left atrial abnormality, left axis deviation, PRWP cannot exclude anterior infarct old.   Recommendations:   Erica [name]  is a 64 y.o. AAF with rectal CA in 2014 that is in remission, T2DM, former tobacco use, emphysema, interstitial lung disease, idiopathic pulmonary fibrosis, referred to [place] for evaluation of coronary artery calcification seen on CT scan by Dr. Chase Caller.  Patient has had progressively worsening dyspnea on exertion over the last 6 months. No symptoms of angina; however, given her risk factors, her dyspnea could be angina equivalent. Also has known Coronary artery calcification on CT scan that has been present since 2019. Will schedule for lexiscan nuclear stress testing for further evaluation. Will also obtain echocardiogram to exclude any structural abnormalities.   Blood pressure is elevated today. Will add metoprolol succinate 50 mg daily. Has CKD stage 3; therefore, have not further increased her losartan. She will continue to need aggressive risk factor modification. I have stressed the importance of getting her diabetes  under control and working towards weight loss. Will provide [facility] diet sheet. She has not had recent lipids, will obtain these today for further risk stratification. Continue with Simvastatin. She is a former smoker and also has previously used Cocaine, but has been clean since November 2020. I will plan to see her back after the test for follow up and for further recommendations.    *I have discussed this case with Dr. Einar Gip and he personally examined the patient and participated in formulating the plan.*   Miquel Dunn, MSN, APRN, FNP-C [facility]. [place] Office: [phone] Fax: [phone]

## 2019-07-09 LAB — LIPID PANEL
Chol/HDL Ratio: 3.9 ratio (ref 0.0–4.4)
Cholesterol, Total: 209 mg/dL — ABNORMAL HIGH (ref 100–199)
HDL: 53 mg/dL (ref >39)
LDL Chol Calc (NIH): 122 mg/dL — ABNORMAL HIGH (ref 0–99)
Triglycerides: 191 mg/dL — ABNORMAL HIGH (ref 0–149)
VLDL Cholesterol Cal: 34 mg/dL (ref 5–40)

## 2019-07-11 ENCOUNTER — Telehealth: Payer: Self-pay

## 2019-07-11 MED ORDER — ROSUVASTATIN CALCIUM 20 MG PO TABS: 20.0000 mg | ORAL_TABLET | Freq: Every day | ORAL | 6 refills | Status: DC

## 2019-07-11 NOTE — Telephone Encounter (Signed)
Discussed results. Sent in rosuvastatin 44m. Patient verbalized understanding.

## 2019-07-11 NOTE — Telephone Encounter (Signed)
-----  Message from Miquel Dunn, NP sent at 07/11/2019 12:58 PM EST ----- Cholesterol is elevated. I would recommend that she change from simvastatin to Crestor 20 mg daily if she has not already tried this

## 2019-07-12 ENCOUNTER — Telehealth: Payer: Self-pay | Admitting: Podiatry

## 2019-07-12 NOTE — Telephone Encounter (Signed)
I spoke to pt and she states she has a clear drainage from the ingrown toenail procedure. I asked pt if she had redness, or discolored streaks from the toe or swelling or fever. Pt states she does not have those symptoms. I told pt that after an ingrown toenail procedure you may have redness, swelling, oozing from the area about 3-4 weeks. I instructed pt to continue the 1/4 cup epsom salt and 1 qt warm water soaks twice daily for 2 weeks and we would get her in Wednesday of next week. I told pt that if she should have a change of symptoms give [place] a call. Pt states understanding and I transferred to schedulers.

## 2019-07-12 NOTE — Telephone Encounter (Signed)
Pt had ingrown removed 06/27/19 and is having complications with her toe and believes its might be infected. Please give patient a call

## 2019-07-15 ENCOUNTER — Other Ambulatory Visit: Payer: Medicare HMO

## 2019-07-16 ENCOUNTER — Other Ambulatory Visit: Payer: Medicare HMO

## 2019-07-16 ENCOUNTER — Other Ambulatory Visit: Payer: Self-pay

## 2019-07-16 ENCOUNTER — Ambulatory Visit (INDEPENDENT_AMBULATORY_CARE_PROVIDER_SITE_OTHER): Payer: Medicare HMO

## 2019-07-16 DIAGNOSIS — I10 Essential (primary) hypertension: Secondary | ICD-10-CM | POA: Diagnosis not present

## 2019-07-16 DIAGNOSIS — R0609 Other forms of dyspnea: Secondary | ICD-10-CM | POA: Diagnosis not present

## 2019-07-17 ENCOUNTER — Ambulatory Visit: Payer: Medicare HMO | Admitting: Podiatry

## 2019-07-22 ENCOUNTER — Ambulatory Visit: Payer: Medicare HMO | Admitting: Podiatry

## 2019-07-22 ENCOUNTER — Ambulatory Visit (INDEPENDENT_AMBULATORY_CARE_PROVIDER_SITE_OTHER): Payer: Medicare HMO

## 2019-07-22 ENCOUNTER — Other Ambulatory Visit: Payer: Self-pay

## 2019-07-22 DIAGNOSIS — I2510 Atherosclerotic heart disease of native coronary artery without angina pectoris: Secondary | ICD-10-CM

## 2019-07-25 NOTE — Progress Notes (Signed)
Called patient and [facility] for results.

## 2019-07-25 NOTE — Progress Notes (Signed)
Called patient and [facility] for results.

## 2019-07-26 ENCOUNTER — Encounter: Payer: Self-pay | Admitting: Podiatry

## 2019-07-26 ENCOUNTER — Ambulatory Visit (INDEPENDENT_AMBULATORY_CARE_PROVIDER_SITE_OTHER): Payer: Medicare HMO | Admitting: Podiatry

## 2019-07-26 ENCOUNTER — Other Ambulatory Visit: Payer: Self-pay

## 2019-07-26 VITALS — Temp 97.2°F

## 2019-07-26 DIAGNOSIS — D169 Benign neoplasm of bone and articular cartilage, unspecified: Secondary | ICD-10-CM | POA: Diagnosis not present

## 2019-07-26 DIAGNOSIS — L03031 Cellulitis of right toe: Secondary | ICD-10-CM

## 2019-07-26 NOTE — Progress Notes (Signed)
Subjective:   Patient ID: Erica [name], female   DOB: 64 y.o.   MRN: [id number]   HPI Presents stating concerned about the nail that was taken off a month ago and also has a bone spur on the right midfoot that at times can be bothersome   ROS      Objective:  Physical Exam  Neurovascular status intact with the right hallux nail medial border slightly crusted but overall healing well with no active drainage and prominence of the navicular right     Assessment:  Ingrown toenail correction right that actually looks relatively normal and a prominence of the posterior tibial bone right at the insertion navicular F2     Plan:  H&P reviewed conditions and at this point I do not recommend treatment for ingrown unless were to start to drain or become more sore and do not recommend surgery on that right bone spur unless it becomes more tender

## 2019-07-29 ENCOUNTER — Other Ambulatory Visit: Payer: Self-pay | Admitting: Internal Medicine

## 2019-07-29 DIAGNOSIS — J84112 Idiopathic pulmonary fibrosis: Secondary | ICD-10-CM

## 2019-07-30 ENCOUNTER — Telehealth: Payer: Self-pay | Admitting: Internal Medicine

## 2019-07-30 NOTE — Telephone Encounter (Signed)
Routing to MR.

## 2019-07-30 NOTE — Telephone Encounter (Signed)
LMTCB x1 for pt.  

## 2019-07-30 NOTE — Telephone Encounter (Signed)
Spoke with pt. She feels like she is having side effects to Esbriet. Reports increased fatigue, leg and back pain. Pt is currently taking 3 tablets daily. States that she increased to 3 a day is when her symptoms started. This was 1 week ago.  Aaron Edelman - please advise. Thanks.

## 2019-07-30 NOTE — Telephone Encounter (Signed)
Dr. Chase Caller will be in office tomorrow.  Please route to him he can further evaluate and decide if he wants to modify the patient's treatment therapies.Wyn Quaker, FNP

## 2019-07-30 NOTE — Telephone Encounter (Signed)
Patient is returning phone call.  Patient phone number is [phone].

## 2019-07-31 NOTE — Telephone Encounter (Signed)
Reduce pirfenidone to 2 tablets 3 times a day over the next several days and stay there for at least another 2 weeks and then try at 3 tablets 3 times a day  After talking to the patient please forward the phone message to [employer] who can then monitor the patient's side effects over the course of next week when I am doing night shift

## 2019-07-31 NOTE — Telephone Encounter (Signed)
Spoke with pt. She is aware of MR's response. Message will be routed to [employer].

## 2019-08-01 ENCOUNTER — Ambulatory Visit: Payer: Medicare HMO | Admitting: Internal Medicine

## 2019-08-02 MED FILL — ESBRIET 267 MG TABS: 267 | 30 days supply | Qty: 270 | Fill #0

## 2019-08-02 NOTE — Telephone Encounter (Signed)
Was supposed to come and see me in January 2021 after she had a spirometry on June 13, 2019.  Do not see any follow-up at all.  Plan -Your spirometry and DLCO appointment in April 2021 -Make a 15-minute slot to see me in April 2021 ILD clinic or any other day.  [If you do not get the spirometry by then just make a visit and do symptom score on walk test]

## 2019-08-05 ENCOUNTER — Telehealth: Payer: Self-pay | Admitting: Internal Medicine

## 2019-08-05 NOTE — Telephone Encounter (Signed)
Pt was scheduled for a visit 08/01/19 with MR but the office was closed. We attempted to call pt 2/17 to see if she wanted to have her visit be made a televisit with APP or change her appt to a different day with MR but were unable to reach pt. Message was left for pt 2/17 about changing her appt.

## 2019-08-05 NOTE — Telephone Encounter (Signed)
Rx for Esbriet was already sent to [place] on 07/29/2019 Spoke with the pt and notified that this was done  Nothing further needed

## 2019-08-06 ENCOUNTER — Ambulatory Visit: Payer: Medicare HMO | Admitting: Cardiology

## 2019-08-06 ENCOUNTER — Other Ambulatory Visit: Payer: Self-pay

## 2019-08-06 ENCOUNTER — Encounter: Payer: Self-pay | Admitting: Cardiology

## 2019-08-06 VITALS — BP 132/80 | HR 69 | Temp 97.8°F | Ht 63.0 in | Wt 195.0 lb

## 2019-08-06 DIAGNOSIS — R0609 Other forms of dyspnea: Secondary | ICD-10-CM

## 2019-08-06 DIAGNOSIS — I2510 Atherosclerotic heart disease of native coronary artery without angina pectoris: Secondary | ICD-10-CM

## 2019-08-06 DIAGNOSIS — I10 Essential (primary) hypertension: Secondary | ICD-10-CM

## 2019-08-06 DIAGNOSIS — E782 Mixed hyperlipidemia: Secondary | ICD-10-CM

## 2019-08-06 MED ORDER — ATORVASTATIN CALCIUM 20 MG PO TABS: 20.0000 mg | ORAL_TABLET | Freq: Every day | ORAL | 2 refills | Status: DC

## 2019-08-06 NOTE — Progress Notes (Signed)
Primary Physician/Referring:  Nolene Ebbs, MD  Patient ID: Erica [name], female    DOB: Aug 06, 1955, 64 y.o.   MRN: [id number]  Chief Complaint  Patient presents with  . Hypertension  . Coronary Artery Disease  . Follow-up    4 weeks   HPI:    HPI: Erica [name]  is a 64 y.o. AAF with rectal CA in 2014 that is in remission, T2DM, former tobacco use, emphysema, interstitial lung disease, idiopathic pulmonary fibrosis, recently evaluated by [place] for coronary artery calcification on CT scan. She underwent echocardiogram and lexiscan nuclear stress testing and now presents for follow up. She was started on Metoprolol as well as Crestor. She now presents for follow up.   Symptoms have been stable since last seen by [place]. Dyspnea is actually some better. No chest pain. She is tolerating medications well. Has noticed worsening back pain since being on Crestor. She is working to lose weight.   Diabetes is uncontrolled, but improving.  She quit smoking in May 2020. No alcohol use. She formerly used cocaine, but has been quit since 05/06/2019.  Past Medical History:  Diagnosis Date  . Arthritis    especially in shoulders  . Asthma   . Depression   . Diabetes mellitus   . GERD (gastroesophageal reflux disease)   . Headache(784.0)    "mild"  . Hypertension   . Mental disorder    depression  . Neuropathy    feet   . Pain    arthritis pain - takes tramadol as needed  . Rectal polyp    very little bleeding with bowel movements- no pain   Past Surgical History:  Procedure Laterality Date  . ANAL RECTAL MANOMETRY N/A 07/13/2016   Procedure: ANO RECTAL MANOMETRY;  Surgeon: Leighton Ruff, MD;  Location: [facility];  Service: Endoscopy;  Laterality: N/A;  . CESAREAN SECTION    . EUS N/A 11/21/2012   Procedure: LOWER ENDOSCOPIC ULTRASOUND (EUS);  Surgeon: Arta Silence, MD;  Location: [facility];  Service: Endoscopy;  Laterality: N/A;  . FLEXIBLE SIGMOIDOSCOPY N/A 11/21/2012   Procedure:  FLEXIBLE SIGMOIDOSCOPY;  Surgeon: Arta Silence, MD;  Location: [facility];  Service: Endoscopy;  Laterality: N/A;  . FLEXIBLE SIGMOIDOSCOPY N/A 01/28/2013   Procedure: FLEXIBLE SIGMOIDOSCOPY;  Surgeon: Leighton Ruff, MD;  Location: [facility];  Service: Endoscopy;  Laterality: N/A;  . LAPAROSCOPIC LOW ANTERIOR RESECTION N/A 01/29/2013   Procedure: LAPAROSCOPIC LOW ANTERIOR RESECTION, Rigid Proctoscopy;  Surgeon: Leighton Ruff, MD;  Location: [facility];  Service: General;  Laterality: N/A;  . LAPAROSCOPIC SIGMOID COLECTOMY N/A 11/14/2012   Procedure: DIAGNOSTIC LAPAROSCOPY AND SIGMOIDMOIDOSCOPY ;  Surgeon: Rolm Bookbinder, MD;  Location: [facility];  Service: General;  Laterality: N/A;  . RECTAL ULTRASOUND N/A 07/13/2016   Procedure: RECTAL ULTRASOUND;  Surgeon: Leighton Ruff, MD;  Location: [facility];  Service: Endoscopy;  Laterality: N/A;  . TONSILLECTOMY    . TONSILLECTOMY AND ADENOIDECTOMY     Social History   Socioeconomic History  . Marital status: Single    Spouse name: Not on file  . Number of children: 1  . Years of education: Not on file  . Highest education level: Not on file  Occupational History  . Not on file  Tobacco Use  . Smoking status: Former Smoker    Packs/day: 0.50    Years: 20.00    Pack years: 10.00    Types: Cigarettes    Quit date: 01/05/2018    Years since quitting: 1.5  .  Smokeless tobacco: Never Used  . Tobacco comment: Unknown   Substance and Sexual Activity  . Alcohol use: Not Currently  . Drug use: Yes    Types: "Crack" cocaine    Comment: Unknown   . Sexual activity: Never    Birth control/protection: None  Other Topics Concern  . Not on file  Social History Narrative  . Not on file   Social Determinants of Health   Financial Resource Strain:   . Difficulty of Paying Living Expenses: Not on file  Food Insecurity:   . Worried About Charity fundraiser in the Last Year: Not on file  . Ran Out of Food in the Last Year: Not on file    Transportation Needs:   . Lack of Transportation (Medical): Not on file  . Lack of Transportation (Non-Medical): Not on file  Physical Activity:   . Days of Exercise per Week: Not on file  . Minutes of Exercise per Session: Not on file  Stress:   . Feeling of Stress : Not on file  Social Connections:   . Frequency of Communication with Friends and Family: Not on file  . Frequency of Social Gatherings with Friends and Family: Not on file  . Attends Religious Services: Not on file  . Active Member of Clubs or Organizations: Not on file  . Attends Archivist Meetings: Not on file  . Marital Status: Not on file  Intimate Partner Violence:   . Fear of Current or Ex-Partner: Not on file  . Emotionally Abused: Not on file  . Physically Abused: Not on file  . Sexually Abused: Not on file   ROS  Review of Systems  Constitution: Positive for weight gain. Negative for decreased appetite, malaise/fatigue and weight loss.  Eyes: Negative for visual disturbance.  Cardiovascular: Positive for dyspnea on exertion. Negative for chest pain, claudication, leg swelling, orthopnea, palpitations and syncope.  Respiratory: Negative for hemoptysis and wheezing.   Endocrine: Negative for cold intolerance and heat intolerance.  Hematologic/Lymphatic: Does not bruise/bleed easily.  Skin: Negative for nail changes.  Musculoskeletal: Positive for myalgias (since starting crestor). Negative for muscle weakness.  Gastrointestinal: Negative for abdominal pain, change in bowel habit, nausea and vomiting.  Neurological: Negative for difficulty with concentration, dizziness, focal weakness and headaches.  Psychiatric/Behavioral: Negative for altered mental status and suicidal ideas.  All other systems reviewed and are negative.  Objective  Blood pressure 132/80, pulse 69, temperature 97.8 F (36.6 C), height _0  (1.6 m), weight 195 lb (88.5 kg), SpO2 99 %. Body mass index is 34.54 kg/m.    Physical Exam  Constitutional: She is oriented to person, place, and time. Vital signs are normal. She appears well-developed and well-nourished.  Short statured and mod obese  HENT:  Head: Normocephalic and atraumatic.  Cardiovascular: Normal rate, regular rhythm, normal heart sounds and intact distal pulses.  Pulmonary/Chest: Effort normal and breath sounds normal. No accessory muscle usage. No respiratory distress.  Abdominal: Soft. Bowel sounds are normal.  Musculoskeletal:        General: Normal range of motion.     Cervical back: Normal range of motion.  Neurological: She is alert and oriented to person, place, and time.  Skin: Skin is warm and dry.  Vitals reviewed.  Radiology:  CT of chest 04/12/2019:  1. The appearance of the lungs remains compatible with interstitial lung disease, with a spectrum of findings considered diagnostic of usual interstitial pneumonia (UIP) per current ATS guidelines. Minimal progression compared  to the prior study. 2. Aortic atherosclerosis, in addition to left anterior descending coronary artery disease. Please note that although the presence of coronary artery calcium documents the presence of coronary artery disease, the severity of this disease and any potential stenosis cannot be assessed on this non-gated CT examination. Assessment for potential risk factor modification, dietary therapy or pharmacologic therapy may be warranted, if clinically indicated. 3. Cholelithiasis.  Aortic Atherosclerosis (ICD10-I70.0).  Laboratory examination:   Recent Labs    08/29/18 0915 05/09/19 0133 05/11/19 2220  NA 139 130* 131*  K 4.0 4.3 5.0  CL 109 101 101  CO2 21* 17* 22  GLUCOSE 189* 494* 502*  BUN 19 32* 35*  CREATININE 1.38* 1.51* 1.69*  CALCIUM 8.2* 8.5* 8.7*  GFRNONAA 40* 36* 32*  GFRAA 47* 42* 37*   CMP Latest Ref Rng & Units 05/11/2019 05/09/2019 08/29/2018  Glucose 70 - 99 mg/dL 502(HH) 494(H) 189(H)  BUN 8 - 23 mg/dL 35(H)  32(H) 19  Creatinine 0.44 - 1.00 mg/dL 1.69(H) 1.51(H) 1.38(H)  Sodium 135 - 145 mmol/L 131(L) 130(L) 139  Potassium 3.5 - 5.1 mmol/L 5.0 4.3 4.0  Chloride 98 - 111 mmol/L 101 101 109  CO2 22 - 32 mmol/L 22 17(L) 21(L)  Calcium 8.9 - 10.3 mg/dL 8.7(L) 8.5(L) 8.2(L)  Total Protein 6.5 - 8.1 g/dL 7.6 8.0 8.0  Total Bilirubin 0.3 - 1.2 mg/dL 1.0 1.2 1.2  Alkaline Phos 38 - 126 U/L 110 132(H) 124  AST 15 - 41 U/L _0 ALT 0 - 44 U/L _1 CBC Latest Ref Rng & Units 05/09/2019 08/29/2018 08/30/2017  WBC 4.0 - 10.5 K/uL 6.6 4.2 4.5  Hemoglobin 12.0 - 15.0 g/dL 11.5(L) 11.0(L) 11.0(L)  Hematocrit 36.0 - 46.0 % 32.7(L) 32.0(L) 31.0(L)  Platelets 150 - 400 K/uL 181 171 166   Lipid Panel     Component Value Date/Time   CHOL 209 (H) 07/08/2019 1032   TRIG 191 (H) 07/08/2019 1032   HDL 53 07/08/2019 1032   CHOLHDL 3.9 07/08/2019 1032   CHOLHDL 2.6 Ratio 08/21/2008 2129   VLDL 25 08/21/2008 2129   LDLCALC 122 (H) 07/08/2019 1032   HEMOGLOBIN A1C Lab Results  Component Value Date   HGBA1C 7.9 (H) 05/11/2019   MPG 180.03 05/11/2019   TSH No results for input(s): TSH in the last 8760 hours. Medications   Current Outpatient Medications  Medication Instructions  . albuterol (PROVENTIL HFA;VENTOLIN HFA) 108 (90 BASE) MCG/ACT inhaler 2 puffs, Every 4 hours PRN  . albuterol (PROVENTIL) 2.5 mg, Nebulization, Every 6 hours PRN  . amLODipine (NORVASC) 10 mg, Oral, Daily  . atorvastatin (LIPITOR) 20 mg, Oral, Daily  . Esbriet 801 mg, Oral, 3 times daily  . gabapentin (NEURONTIN) 300 mg, Oral, 3 times daily  . hydrochlorothiazide (MICROZIDE) 12.5 mg, Oral, Daily  . losartan (COZAAR) 50 mg, Oral, Daily  . metoprolol succinate (TOPROL-XL) 50 MG 24 hr tablet TAKE 1 TABLET(50 MG) BY MOUTH DAILY WITH OR IMMEDIATELY FOLLOWING A MEAL  . Multiple Vitamin (MULTIVITAMIN WITH MINERALS) TABS tablet 1 tablet, Oral, Daily  . nortriptyline (PAMELOR) 25 mg, Oral, At bedtime PRN  . pantoprazole  (PROTONIX) 40 mg, Daily  . sertraline (ZOLOFT) 50 mg, Oral, Daily  . SYMBICORT 160-4.5 MCG/ACT inhaler 2 puffs, Inhalation, 2 times daily  . Tiotropium Bromide Monohydrate (SPIRIVA RESPIMAT) 1.25 MCG/ACT AERS 2 puffs, Inhalation, Daily  . Toujeo SoloStar 100 Units, Subcutaneous, Daily at bedtime    Cardiac Studies:  Lexiscan (Walking with mod Bruce)Tetrofosmin Stress Test  07/22/2019: Nondiagnostic ECG stress. There is  apical thinning an mild soft tissue attenuation artifact in inferior wall without ischemia or infarct.  All segments of left ventricle demonstrated normal wall motion and thickening. Stress LV EF is moderately dysfunctional 39%, however visually appearted to be normal.  No previous exam available for comparison. Low risk study.   Echocardiogram 07/16/2019:  Left ventricle cavity is normal in size. Moderate concentric hypertrophy  of the left ventricle. Normal global wall motion. Normal LV systolic  function with EF 56%. Doppler evidence of grade I (impaired) diastolic  dysfunction, normal LAP.  Left atrial cavity is moderately dilated.  Trileaflet aortic valve. Moderate (Grade II) aortic regurgitation.  Inadequate TR jet to estimate pulmonary artery systolic pressure. Normal  right atrial pressure.  Trace pericardial effusion.  Assessment     ICD-10-CM   1. Coronary artery calcification seen on CT scan  I25.10   2. Essential hypertension  I10   3. Dyspnea on exertion  R06.00   4. Mixed hyperlipidemia  E78.2     EKG 07/08/2019: Normal sinus rhythm at 70 bpm, left atrial abnormality, left axis deviation, PRWP cannot exclude anterior infarct old.   Recommendations:   Erica [name]  is a 64 y.o. AAF with rectal CA in 2014 that is in remission, T2DM, former tobacco use, emphysema, interstitial lung disease, idiopathic pulmonary fibrosis, recently evaluated by [place] for coronary artery calcification on CT scan. Underwent lexiscan nuclear stress test and echocardiogram and  now presents for follow up.  I have reviewed and discussed her recent echocardiogram and lexiscan nuclear stress test results with the patient, stress test was considered low risk study. She is noted to have moderate LVH and grade 1 diastolic dysfunction likely related to hypertension, blood pressure has improved with Metoprolol. Will continue the same. She does have moderate aortic regurgitation that will need continued surveillance, will repeat in 1 year. I suspect her dyspnea is related to COPD and deconditioning. I would recommend continued aggressive risk factor modification. She will continue to work with her PCP for diabetes management.   In regard to hyperlipidemia, she was started on Crestor during the interim, but developed myalgia symptoms. I will change to Lipitor 20 mg daily. She will likely need higher dose, but will start with 20 mg to see if she tolerates. Goal LDL<55 given her CAD and DM. Will recheck her lipids and CMP in 3 months. I have encouraged her to continue with dietary changes to help with weight loss and also regular exercise. We will see her back in 3 months after labs for follow up.     Miquel Dunn, MSN, APRN, FNP-C [facility]. [place] Office: [phone] Fax: [phone]

## 2019-08-08 NOTE — Telephone Encounter (Signed)
Please advise if anything else is needed for this encounter. Thanks.

## 2019-08-15 ENCOUNTER — Ambulatory Visit: Payer: Medicare HMO

## 2019-08-15 ENCOUNTER — Other Ambulatory Visit: Payer: Self-pay

## 2019-08-15 ENCOUNTER — Ambulatory Visit (INDEPENDENT_AMBULATORY_CARE_PROVIDER_SITE_OTHER): Payer: Medicare HMO | Admitting: Internal Medicine

## 2019-08-15 ENCOUNTER — Encounter: Payer: Self-pay | Admitting: Internal Medicine

## 2019-08-15 VITALS — BP 128/76

## 2019-08-15 DIAGNOSIS — J439 Emphysema, unspecified: Secondary | ICD-10-CM

## 2019-08-15 DIAGNOSIS — J84112 Idiopathic pulmonary fibrosis: Secondary | ICD-10-CM | POA: Diagnosis not present

## 2019-08-15 NOTE — Progress Notes (Signed)
This is the case of Erica [name], 64 y.o. Female, who was referred by Dr. Lorella Nimrod in consultation regarding abnormal chest ct scan.   As you very well know, patient smoked 1 pack per week since her 56s, curently down to a cigarette a day, was diagnosed with asthma/copd several yrs ago. Uses alb MDI prn, usually 1-2 x/week. Erica [name] gets more than SOB with more than ADLs. Erica [name] is not on o2.   Erica [name] had rectal CA in 2014 for which Erica [name] had surgery. Still in remission.  Erica [name] had a chest ct scan in 01/2015 as part of CA surveillance.  That showed several pulm nodules (subcm) which have been stable, and some smaller.  Also with increased markings at the bases.   Erica [name] had sinus issues in December.  Wen tot ER as Erica [name] was not getting better.  CXR showed inc interstitial markings, hence the consult today. Erica [name] is almost back to her baseline.   Erica [name] has GERD, controlled with meds. Erica [name] coughs daily, coughs in sleep. Cough worse at night.  On and off wheezing at HS.   Her sinus issues are better. Has PND.    Has snoring, gaspin, choking, unrefreshed sleep.  Wakes up tired and fatigued.  ESS 10. Hypersomnia affects her fxnality.    ROV  08/12/16 Patient returns to the office as follow-up after her tests. Since last seen, Erica [name] has quit smoking. Erica [name] had PFT  which showed mild COPD with an FEV1 of 1. 72 or 84% of predicted. Diffusion capacity was 61%. Her sleep study was (-) for OSA. Erica [name] is able to do her ADLs without much difficulty. Gets winded with more than ADLs. Her reflux is controlled with medicines and diet changes. Her cough is also stable.     09/06/2017 Follow up : COPD and ILD  Pt returns for 1 year follow up . Erica [name] was last seen in March 2018 . Found to have mild COPD and ILD changes on CT chest that are mild worse than 2015 CT scan .  Erica [name] had autoimmune labs that essentially neg. Erica [name] did not keep follow up .  Recommended on smoking cessation.  Says that Erica [name] has baseline dyspnea with activity  none at rest , and dry cough .  Over last year feels dyspnea with activity has gotten worse. Erica [name] gets winded easily .  No increased cough .  Still smoking, cessation discussed.   OV 10/24/2017  Chief Complaint  Patient presents with  . Follow-up    PFT was attempted today but pt unable to complete due to some sinus problems. Pt has some yellow to green mucus postnasally and also is coughing up a lot of mucus as well. Pt also states Erica [name] has increased SOB due to symptoms.    Erica [name] presents to the interstitial lung disease clinic for evaluation of potential interstitial lung disease.  Erica [name] is followed in the general pulmonary clinic as stated above.  Her other issues ongoing smoking.  Erica [name] is somewhat of a poor historian and as soon as I walked in Erica [name] reported a new complaint of acute sinus congestion for the last 2 weeks after having picked up a cold from her sister.  Also the ongoing spring pollen season is making things worse.  Erica [name] is coughing up some green sputum.  Erica [name] feels this needs to be treated by me and Erica [name] wants an antibiotic for this.  There is no associated wheezing or cough or shortness of breath  because of this.  We discussed her underlying lung disease and Erica [name] tells me that Erica [name] is aware that Erica [name] has COPD/emphysema.  But Erica [name] is totally unaware about pulmonary fibrosis or interstitial lung disease.  Erica [name] tells me that overall compared to last year Erica [name] is stable and Erica [name] only has mild dyspnea on exertion without much of a cough.  There is no chest pain no orthopnea proximal nocturnal dyspnea wheezing or hemoptysis or weight loss.  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  Only takes albuerol as needed.  Her last CT scan of the chest high resolution was in January 2018 that thoracic radiology Dr. Lorin Picket described as indicative of UIP.  I personally visualized the CT and I personally think this is indeterminate for UIP.  There is bilateral  bibasal craniocaudal gradients reticular abnormalities but I am not so sure that these are subpleural and more of I do not see clear-cut traction bronchiectasis or honeycombing although I will agree that there is no groundglass.   Erica [name] has ongoing smoking    Results for POLLIE, POMA (MRN [id number]) as of 10/24/2017 16:28  Ref. Range 08/12/2016 14:23 09/07/2016 11:20  Anit Nuclear Antibody(ANA) Latest Ref Range: NEGATIVE  NEG   ANCA Proteinase 3 Latest Ref Range: 0.0 - 3.5 U/mL  <3.5  Anti JO-1 Latest Ref Range: 0.0 - 0.9 AI <3.1 <5.9  Cyclic Citrullin Peptide Ab Latest Units: Units <16   ds DNA Ab Latest Units: IU/mL <1   Myeloperoxidase Ab Latest Ref Range: 0.0 - 9.0 U/mL  <9.0  Cytoplasmic (C-ANCA) Latest Ref Range: <1:20  1:40 (H)   Scleroderma (Scl-70) (ENA) Antibody, IgG Latest Ref Range: <1.0 NEG AI  <1.0 NEG        OV 04/08/2019  Subjective:  Patient ID: Erica [name], female , DOB: Jul 23, 1955 , age 64 y.o. , MRN: [id number] , ADDRESS: [place] [address]   04/08/2019 -   Chief Complaint  Patient presents with  . Follow-up    Patient reports that Erica [name] still has sob with exertion and some cough. Erica [name] states that some days are better than others.    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 -returns for follow-up.  Last seen by myself in May 2019.  At that point in time I ordered a high-resolution CT chest.  Erica [name] had it but has not properly followed up.  Erica [name] in between had a telephone visit with one of the nurse practitioners and was given COPD medication refill.  After that Erica [name] had a visit with another nurse practitioner.  Erica [name] tells me that in the last year Erica [name] is quit smoking.  Erica [name] is also quit working because of dyspnea on exertion relieved by rest.  Erica [name] tells me that since last year her dyspnea is progressive.  Erica [name] is able to climb 1 flight of stairs and then has to  stop because of extreme shortness of breath.  Erica [name] is continuing with her inhalers.  Erica [name] does not have nighttime or daytime oxygen.  Review of the CT scan and visualization of the CT scan from 1 year ago shows UIP features.  There are no other new issues.     CLINICAL DATA:  Interstitial lung disease.  EXAM: CT CHEST WITHOUT CONTRAST - 04/09/2018  TECHNIQUE: Multidetector CT imaging of the chest was performed following the standard  protocol without intravenous contrast. High resolution imaging of the lungs, as well as inspiratory and expiratory imaging, was performed.  COMPARISON:  07/06/2016, PET 05/14/2015 and CT chest 01/12/2015.  FINDINGS: Cardiovascular: Coronary artery calcification. Heart is at the upper limits of normal in size to mildly enlarged. No pericardial effusion.  Mediastinum/Nodes: Mediastinal lymph nodes measure up to 1.2 cm in the low right paratracheal station, as before. Hilar regions are difficult to evaluate without IV contrast. No axillary adenopathy. Mild esophageal dilatation. Esophagus is otherwise unremarkable.  Lungs/Pleura: Worsening peripheral and basilar predominant subpleural reticulation, ground-glass, traction bronchiectasis/bronchiolectasis and probable honeycombing. No air trapping. Perifissural nodules measure up to 8 x 13 mm along the left major fissure, minimally increased from 07/06/2016 with still felt to represent subpleural lymph nodes. No pleural fluid. Airway is unremarkable.  Upper Abdomen: Visualized portion of the liver is unremarkable. Stones layer in the gallbladder. Visualized portions of the adrenal glands, kidneys, spleen, pancreas, stomach and bowel are grossly unremarkable. There may be a small hiatal hernia. Upper abdominal lymph nodes are not enlarged by CT size criteria.  Musculoskeletal: Degenerative changes in the spine.  IMPRESSION: 1. Progressive pulmonary parenchymal pattern of fibrosis,  as described above. Findings are consistent with UIP per consensus guidelines: Diagnosis of Idiopathic Pulmonary Fibrosis: An Official ATS/ERS/JRS/ALAT Clinical Practice Guideline. [place], Iss 5, [phone], Feb 11 2017. 2. Cholelithiasis. 3. Coronary artery calcification.   ROS  OV 06/18/2019  Subjective:  Patient ID: Erica [name], female , DOB: June 01, 1956 , age 58 y.o. 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 Follow-up    cough, mucus is tan     [name] 64 y.o. -follow-up for her chest CT and PFT results.  In the interim denies any complaints.  Her symptom score is listed below.  Erica [name] does not have any chest pain with exertion.  In talking to Erica [name] only reports the symptoms.  I had to press her multiple different ways to really know if Erica [name] understands what is going on in terms of etiologies.  After much conversation Erica [name] currently at her COPD.  Erica [name] had no idea about pulmonary fibrosis or interstitial lung disease.  I used several different colloquial terms.  This has been explained to her in the past.  But Erica [name] understands Erica [name] is got progressive shortness of breath and this is due to her lung disease.  So we went over the concept of pulmonary fibrosis again.  Her CT shows progressive UIP pattern of pulmonary fibrosis.  His previous serologies are negative.  Erica [name] has coronary artery calcification and emphysema.  Her symptom burden is listed below.  Her walking desaturation test shows progression.  Erica [name] is not had a stress test in a long time.  Erica [name] does not have any chest pains  Erica [name] says Erica [name] had overnight oxygen study but we do not have those results with [place].          COMPARISON:  High-resolution chest CT 10/2  01/2018.   FINDINGS: Cardiovascular: Heart size is normal. There is no significant pericardial fluid, thickening or pericardial calcification. There is aortic atherosclerosis, as well as atherosclerosis of the great vessels of the mediastinum and the coronary arteries, including calcified atherosclerotic plaque in the  left anterior descending coronary arteries.  Mediastinum/Nodes: No pathologically enlarged mediastinal or hilar lymph nodes. Please note that accurate exclusion of hilar adenopathy is limited on noncontrast CT scans. Esophagus is unremarkable in appearance. No axillary lymphadenopathy.  Lungs/Pleura: High-resolution images demonstrate widespread patchy areas of ground-glass attenuation, septal thickening, thickening of the peribronchovascular interstitium, cylindrical bronchiectasis, peripheral bronchiolectasis and areas of honeycombing. These findings have a definitive craniocaudal gradient and arm minimally progressive compared to the prior study. Inspiratory and expiratory imaging is unremarkable. No acute consolidative airspace disease. No pleural effusions. No suspicious appearing pulmonary nodules or masses are noted.  Upper Abdomen: Partially calcified gallstone in the neck of the gallbladder.  Musculoskeletal: There are no aggressive appearing lytic or blastic lesions noted in the visualized portions of the skeleton.  IMPRESSION: 1. The appearance of the lungs remains compatible with interstitial lung disease, with a spectrum of findings considered diagnostic of usual interstitial pneumonia (UIP) per current ATS guidelines. Minimal progression compared to the prior study. 2. Aortic atherosclerosis, in addition to left anterior descending coronary artery disease. Please note that although the presence of coronary artery calcium documents the presence of coronary artery disease, the severity of this disease and any potential stenosis cannot be assessed  on this non-gated CT examination. Assessment for potential risk factor modification, dietary therapy or pharmacologic therapy may be warranted, if clinically indicated. 3. Cholelithiasis.  Aortic Atherosclerosis (ICD10-I70.0).   Electronically Signed   By: Vinnie Langton M.D.   On: 04/12/2019 16:30  xxxxxxxxxxxxxxxxxxxxxxxxxxxxxxxxxxxxxxxxxxxxxxxxxxxxxxxxxxxx     OV 08/15/2019  Subjective:  Patient ID: Erica Baldonado, female , DOB: [birth year] , age 69 y.o. , MRN: [id number] , ADDRESS: [address] [place] [address]   08/15/2019 -   Chief Complaint  Patient presents with  . Follow-up    Pt states Erica [name] is some better since last visit. Pt still becomes SOB with exertion and will have occ SOB if not doing anything. Pt also has an occ cough but denies any CP.   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  IPF diagnosis given June 18, 2019.  Commenced on pirfenidone January 2021.  Ex-smoker  GERD  Last CT scan of the chest October 2020.  [name] 64 y.o. -presents for follow-up with her sister.  Erica [name] feels Erica [name] is slowly getting worse.  Last visit early January 2021 started on pirfenidone given the diagnosis of IPF.  Overnight pulse ox study was normal in December 2020 so Erica [name] is not on oxygen.  Erica [name] tells me that Erica [name] feels Erica [name] is getting slowly worse.  A few months ago Erica [name] could climb the 20 steps to her apartment stopping only once.  Currently Erica [name] is stopping 2 times.  Her sister is extremely worried about the apartment situation because it is on the first or second floor.  And there is one flight of stairs to climb.  Also after Erica [name] started the pirfenidone Erica [name] had tolerance issues with GI symptoms.  And Erica [name] was reduced to 2 tablets 3 times daily.  Erica [name] is now back up at 3 pills 3 times daily and is tolerating this just fine.  Symptom scores are listed below walking desaturation test suggest that Erica [name] is indeed getting worse.  Erica [name] is seeing Dr.  Einar Gip for coronary artery calcification.  Notes reviewed.    SYMPTOM SCALE - ILD 06/18/2019 188# 08/15/2019 195#  O2 use ra ra  Shortness of Breath 0 -> 5 scale with 5 being worst (  score 6 If unable to do)   At rest 1 3  Simple tasks - showers, clothes change, eating, shaving 2 3  Household (dishes, doing bed, laundry) 2 4  Shopping 2 4  Walking level at own pace 2 4  Walking up Stairs 3 2  Total (40 - 48) Dyspnea Score 13 20  How bad is your cough? 4 x  How bad is your fatigue 4 4  nausea  3  vomit  3  diarrhea  3  anxiety  3  depression  2    2    Simple office walk 185 feet x  3 laps goal with forehead probe 10/24/2017  04/08/2019  06/18/2019 188# 08/15/2019   O2 used Room air Room air Room air Room air  Number laps completed _0 Comments about pace Normal pace   avgg  Resting Pulse Ox/HR 97% and 77/min 100% 95% and 91/min 96% and 101/min  Final Pulse Ox/HR 97% and 103/min 97% and 105/min 90% and 106/min 90% and 122/min  Desaturated </= 88% no no no no  Desaturated <= 3% points no Yes, 3 points Yes, 3 poitns Yes, 6 points  Got Tachycardic >/= 90/min yes yes yes yes  Symptoms at end of test No comment x No dyspnea Moderate duspnea  Miscellaneous comments No comment from CMA x x ? worse     Results for IVETTE, CASTRONOVA (MRN [id number]) as of 06/18/2019 12:12  Ref. Range 08/11/2016 10:13 10/24/2017 14:58 06/13/2019 08:56  FVC-Pre Latest Units: L 2.56 2.79 2.52  FVC-%Pred-Pre Latest Units: % 98 108 98    [name], DRAGAN A (MRN [id number]) as of 06/18/2019 12:12  Ref. Range 08/11/2016 10:13 10/24/2017 14:58 06/13/2019 08:56  DLCO unc Latest Units: ml/min/mmHg 14.81  7.70  DLCO unc % pred Latest Units: % 61  38    ROS - per HPI     has a past medical history of Arthritis, Asthma, Depression, Diabetes mellitus, GERD (gastroesophageal reflux disease), Headache(784.0), Hypertension, Mental disorder, Neuropathy, Pain, and Rectal polyp.   reports that Erica [name] quit smoking  about 19 months ago. Her smoking use included cigarettes. Erica [name] has a 10.00 pack-year smoking history. Erica [name] has never used smokeless tobacco.  Past Surgical History:  Procedure Laterality Date  . ANAL RECTAL MANOMETRY N/A 07/13/2016   Procedure: ANO RECTAL MANOMETRY;  Surgeon: Leighton Ruff, MD;  Location: [facility];  Service: Endoscopy;  Laterality: N/A;  . CESAREAN SECTION    . EUS N/A 11/21/2012   Procedure: LOWER ENDOSCOPIC ULTRASOUND (EUS);  Surgeon: Arta Silence, MD;  Location: [facility];  Service: Endoscopy;  Laterality: N/A;  . FLEXIBLE SIGMOIDOSCOPY N/A 11/21/2012   Procedure: FLEXIBLE SIGMOIDOSCOPY;  Surgeon: Arta Silence, MD;  Location: [facility];  Service: Endoscopy;  Laterality: N/A;  . FLEXIBLE SIGMOIDOSCOPY N/A 01/28/2013   Procedure: FLEXIBLE SIGMOIDOSCOPY;  Surgeon: Leighton Ruff, MD;  Location: [facility];  Service: Endoscopy;  Laterality: N/A;  . LAPAROSCOPIC LOW ANTERIOR RESECTION N/A 01/29/2013   Procedure: LAPAROSCOPIC LOW ANTERIOR RESECTION, Rigid Proctoscopy;  Surgeon: Leighton Ruff, MD;  Location: [facility];  Service: General;  Laterality: N/A;  . LAPAROSCOPIC SIGMOID COLECTOMY N/A 11/14/2012   Procedure: DIAGNOSTIC LAPAROSCOPY AND SIGMOIDMOIDOSCOPY ;  Surgeon: Rolm Bookbinder, MD;  Location: [facility];  Service: General;  Laterality: N/A;  . RECTAL ULTRASOUND N/A 07/13/2016   Procedure: RECTAL ULTRASOUND;  Surgeon: Leighton Ruff, MD;  Location: [facility];  Service: Endoscopy;  Laterality: N/A;  . TONSILLECTOMY    .  TONSILLECTOMY AND ADENOIDECTOMY      Allergies  Allergen Reactions  . Penicillins Hives, Itching and Swelling    Tongue swells up Has patient had a PCN reaction causing immediate rash, facial/tongue/throat swelling, SOB or lightheadedness with hypotension: Yes Has patient had a PCN reaction causing severe rash involving mucus membranes or skin necrosis: Yes Has patient had a PCN reaction that required hospitalization Yes Has patient had a PCN  reaction occurring within the last 10 years: No If all of the above answers are "NO", then may proceed with Cephalosporin use.     Immunization History  Administered Date(s) Administered  . Influenza Split 03/13/2017  . Influenza Whole 04/26/2018  . Influenza,inj,Quad PF,6+ Mos 03/09/2019  . Influenza-Unspecified 03/13/2016    Family History  Problem Relation Age of Onset  . Hypertension Father   . Stroke Father   . Diabetes Father   . Heart disease Mother   . Hypertension Mother   . Hyperlipidemia Mother   . Hypertension Sister   . Hypertension Brother   . Hypertension Sister   . Hypertension Brother   . Cancer Other 47       colon cancer   . Cancer Maternal Aunt        cancer, unknown type   . Cancer Maternal Uncle        bone cancer   . Cancer Paternal Aunt        lung cancer  . Cancer Paternal Uncle        lung cancer  . Cancer Maternal Uncle        colon cancer and prostate cancer   . Cancer Maternal Uncle        prostate cancer      Current Outpatient Medications:  .  albuterol (PROVENTIL HFA;VENTOLIN HFA) 108 (90 BASE) MCG/ACT inhaler, Inhale 2 puffs into the lungs every 4 (four) hours as needed for wheezing., Disp: , Rfl:  .  albuterol (PROVENTIL) (2.5 MG/3ML) 0.083% nebulizer solution, Take 3 mLs (2.5 mg total) by nebulization every 6 (six) hours as needed for wheezing or shortness of breath., Disp: 75 mL, Rfl: 12 .  amLODipine (NORVASC) 10 MG tablet, Take 10 mg by mouth daily. , Disp: , Rfl: 2 .  atorvastatin (LIPITOR) 20 MG tablet, Take 1 tablet (20 mg total) by mouth daily., Disp: 30 tablet, Rfl: 2 .  gabapentin (NEURONTIN) 300 MG capsule, Take 300 mg by mouth 3 (three) times daily. , Disp: , Rfl:  .  hydrochlorothiazide (MICROZIDE) 12.5 MG capsule, Take 1 capsule (12.5 mg total) by mouth daily., Disp: 30 capsule, Rfl: 0 .  losartan (COZAAR) 50 MG tablet, Take 1 tablet (50 mg total) by mouth daily., Disp: 30 tablet, Rfl: 0 .  metoprolol succinate  (TOPROL-XL) 50 MG 24 hr tablet, TAKE 1 TABLET(50 MG) BY MOUTH DAILY WITH OR IMMEDIATELY FOLLOWING A MEAL, Disp: 90 tablet, Rfl: 3 .  Multiple Vitamin (MULTIVITAMIN WITH MINERALS) TABS tablet, Take 1 tablet by mouth daily., Disp: , Rfl:  .  nortriptyline (PAMELOR) 25 MG capsule, Take 25 mg by mouth at bedtime as needed for sleep. , Disp: , Rfl: 5 .  pantoprazole (PROTONIX) 40 MG tablet, Take 40 mg by mouth daily., Disp: , Rfl:  .  Pirfenidone (ESBRIET) 267 MG TABS, Take 3 tablets (801 mg total) by mouth in the morning, at noon, and at bedtime., Disp: 270 tablet, Rfl: 11 .  sertraline (ZOLOFT) 50 MG tablet, Take 1 tablet (50 mg total) by mouth daily., Disp:  30 tablet, Rfl: 0 .  SYMBICORT 160-4.5 MCG/ACT inhaler, Inhale 2 puffs into the lungs 2 (two) times daily. , Disp: , Rfl:  .  Tiotropium Bromide Monohydrate (SPIRIVA RESPIMAT) 1.25 MCG/ACT AERS, Inhale 2 puffs into the lungs daily., Disp: 24 g, Rfl: 0 .  TOUJEO SOLOSTAR 300 UNIT/ML SOPN, Inject 100 Units into the skin at bedtime. , Disp: , Rfl: 1      Objective:   Vitals:   08/15/19 1637  BP: 128/76    Estimated body mass index is 34.54 kg/m as calculated from the following:   Height as of 08/06/19: _0  (1.6 m).   Weight as of 08/06/19: 195 lb (88.5 kg).  _1 @  There were no vitals filed for this visit.   Physical Exam Obese female with a mask alert and oriented x3.  Bilateral bibasal Velcro crackles present.  No clubbing no cyanosis no edema.  Alert and oriented x3.  Pleasant       Assessment:       ICD-10-CM   1. IPF (idiopathic pulmonary fibrosis) ([facility])  I68.032        Plan:     Patient Instructions        ICD-10-CM   1. Coronary artery calcification  I25.10 Ambulatory referral to Cardiology   I25.84 CANCELED: Ambulatory referral to Cardiology  2. ILD (interstitial lung disease) ([facility])  J84.9 Pulse oximetry, overnight  3. IPF (idiopathic pulmonary fibrosis) ([place])  J84.112   4. Panlobular emphysema  ([facility])  J43.1      #IPF -You might be getting worse despite the fact he started pirfenidone/Esbriet -You had some temporary problems tolerating the pirfenidone but glad you are back on 3 pills 3 times daily and you are handling it fine -Your living condition which is at the first or second floor is a difficult situation in this disease.  You need to be living on the ground floor  Plan  -Continue Esbriet -3 pills 3 times daily separated 5 hours apart and take with food -  -Apply sunscreen with the pills when he stepped out -Check liver function test today or sometime this week -We will do a letter stating to whom it may concern -that you need to live on the ground floor with minimum or no steps to reach to the front door -Retest overnight oxygen study on room air [it was normal in December but it could have changed now]   #Emphysema  -Currently stable  Plan -Continue Spiriva, Symbicort and Singulair   Follow-up -Get spirometry and DLCO done at a first available slot.  If this cannot be done in the next 4 weeks that is fine -Repeat liver function test in 4 weeks to ensure you are tolerating Esbriet okay -Return in 4-5 weeks to see myself or nurse practitioner -to review progress  -If continuing to decline having tolerance problems with pirfenidone then will have to rotate it TO  nintedanib   (Level 04: Estb 30-39 min  visit type: on-site physical face to visit visit spent in total care time and counseling or/and coordination of care by this undersigned MD - Dr Brand Males. This includes one or more of the following on this same day 08/15/2019: pre-charting, chart review, note writing, documentation discussion of test results, diagnostic or treatment recommendations, prognosis, risks and benefits of management options, instructions, education, compliance or risk-factor reduction. It excludes time spent by the [facility] or office staff in the care of the patient . Actual time is 30  min)  SIGNATURE    Dr. Brand Males, M.D., F.C.C.P,  Pulmonary and Critical Care Medicine Staff Physician, [place] Director - Interstitial Lung Disease  Program  Pulmonary [place] [facility] [place], [place], [address]  Pager: [phone], If no answer or between  15:00h - 7:00h: call [phone] Telephone: [phone]  5:37 PM 08/15/2019

## 2019-08-15 NOTE — Patient Instructions (Addendum)
ICD-10-CM   1. Coronary artery calcification  I25.10 Ambulatory referral to Cardiology   I25.84 CANCELED: Ambulatory referral to Cardiology  2. ILD (interstitial lung disease) (HCC)  J84.9 Pulse oximetry, overnight  3. IPF (idiopathic pulmonary fibrosis) ([place])  J84.112   4. Panlobular emphysema ([place])  J43.1      #IPF -You might be getting worse despite the fact he started pirfenidone/Esbriet -You had some temporary problems tolerating the pirfenidone but glad you are back on 3 pills 3 times daily and you are handling it fine -Your living condition which is at the first or second floor is a difficult situation in this disease.  You need to be living on the ground floor  Plan  -Continue Esbriet -3 pills 3 times daily separated 5 hours apart and take with food -  -Apply sunscreen with the pills when he stepped out -Check liver function test today or sometime this week -We will do a letter stating to whom it may concern -that you need to live on the ground floor with minimum or no steps to reach to the front door -Retest overnight oxygen study on room air [it was normal in December but it could have changed now]   #Emphysema  -Currently stable  Plan -Continue Spiriva, Symbicort and Singulair   Follow-up -Get spirometry and DLCO done at a first available slot.  If this cannot be done in the next 4 weeks that is fine -Repeat liver function test in 4 weeks to ensure you are tolerating Esbriet okay -Return in 4-5 weeks to see myself or nurse practitioner -to review progress  -If continuing to decline having tolerance problems with pirfenidone then will have to rotate it TO  nintedanib

## 2019-08-19 ENCOUNTER — Ambulatory Visit: Payer: Medicare HMO | Attending: Internal Medicine

## 2019-08-19 DIAGNOSIS — Z23 Encounter for immunization: Secondary | ICD-10-CM

## 2019-08-19 NOTE — Progress Notes (Addendum)
   Covid-19 Vaccination Clinic  Name:  Erica [name]    MRN: [id number] DOB: Oct 28, 1955  08/19/2019  [name] was observed post Covid-19 immunization for 30 minutes based on pre-vaccination screening without incident. She was provided with Vaccine Information Sheet and instruction to access the V-Safe system.   [name] was instructed to call 911 with any severe reactions post vaccine: Marland Kitchen Difficulty breathing  . Swelling of face and throat  . A fast heartbeat  . A bad rash all over body  . Dizziness and weakness   Immunizations Administered    Name Date Dose VIS Date Route   Pfizer COVID-19 Vaccine 08/19/2019  9:42 AM 0.3 mL 05/24/2019 Intramuscular   Manufacturer: [place]   Lot: [id number]   [place]: [id number]

## 2019-09-02 MED FILL — ESBRIET 267 MG TABS: 267 | 30 days supply | Qty: 270 | Fill #1

## 2019-09-03 ENCOUNTER — Telehealth: Payer: Self-pay | Admitting: Adult Health

## 2019-09-03 NOTE — Telephone Encounter (Signed)
LMTCB x1 for pt.  

## 2019-09-04 NOTE — Telephone Encounter (Signed)
LMTCB

## 2019-09-04 NOTE — Telephone Encounter (Signed)
She can try Miralax, or colace. They are both over the counter. If she needs something stronger she can try Dulcolax tablets.Also over the counter. Thanks

## 2019-09-04 NOTE — Telephone Encounter (Signed)
Spoke with pt. States that she is now having issues with constipation while taking Esbriet. Pt was having issues with nausea, vomiting and diarrhea but those symptoms have resolved. She would like recommendations.  Judson Roch - please advise. Thanks.

## 2019-09-05 NOTE — Telephone Encounter (Signed)
Patient made aware of SG recommendations AJ:OINOMVEHMCNO. Nothing further needed at this time.

## 2019-09-12 ENCOUNTER — Ambulatory Visit: Payer: Medicare HMO | Admitting: Adult Health

## 2019-09-18 ENCOUNTER — Ambulatory Visit: Payer: Medicare HMO | Attending: Internal Medicine

## 2019-09-18 DIAGNOSIS — Z23 Encounter for immunization: Secondary | ICD-10-CM

## 2019-09-18 NOTE — Progress Notes (Signed)
   Covid-19 Vaccination Clinic  Name:  BRYSON PALEN    MRN: [id number] DOB: August 20, 1955  09/18/2019  Ms. Gaynor was observed post Covid-19 immunization for 30 minutes based on pre-vaccination screening without incident. She was provided with Vaccine Information Sheet and instruction to access the V-Safe system.   Ms. Coppin was instructed to call 911 with any severe reactions post vaccine: Marland Kitchen Difficulty breathing  . Swelling of face and throat  . A fast heartbeat  . A bad rash all over body  . Dizziness and weakness   Immunizations Administered    Name Date Dose VIS Date Route   Pfizer COVID-19 Vaccine 09/18/2019 11:03 AM 0.3 mL 05/24/2019 Intramuscular   Manufacturer: [employer], [employer]   Lot: [id number]   [place]: [id number]

## 2019-09-20 ENCOUNTER — Other Ambulatory Visit: Payer: Self-pay

## 2019-09-20 ENCOUNTER — Encounter: Payer: Self-pay | Admitting: Adult Health

## 2019-09-20 ENCOUNTER — Other Ambulatory Visit: Payer: Self-pay | Admitting: *Deleted

## 2019-09-20 ENCOUNTER — Encounter (HOSPITAL_COMMUNITY): Payer: Self-pay | Admitting: *Deleted

## 2019-09-20 ENCOUNTER — Ambulatory Visit (INDEPENDENT_AMBULATORY_CARE_PROVIDER_SITE_OTHER): Payer: Medicare HMO | Admitting: Adult Health

## 2019-09-20 VITALS — BP 126/90 | HR 92 | Temp 98.2°F | Ht 63.0 in | Wt 192.8 lb

## 2019-09-20 DIAGNOSIS — J439 Emphysema, unspecified: Secondary | ICD-10-CM | POA: Diagnosis not present

## 2019-09-20 DIAGNOSIS — J849 Interstitial pulmonary disease, unspecified: Secondary | ICD-10-CM

## 2019-09-20 DIAGNOSIS — J84112 Idiopathic pulmonary fibrosis: Secondary | ICD-10-CM

## 2019-09-20 LAB — HEPATIC FUNCTION PANEL
ALT: 15 U/L (ref 0–35)
AST: 22 U/L (ref 0–37)
Albumin: 3.8 g/dL (ref 3.5–5.2)
Alkaline Phosphatase: 98 U/L (ref 39–117)
Bilirubin, Direct: 0.2 mg/dL (ref 0.0–0.3)
Total Bilirubin: 0.6 mg/dL (ref 0.2–1.2)
Total Protein: 7.7 g/dL (ref 6.0–8.3)

## 2019-09-20 MED ORDER — SYMBICORT 160-4.5 MCG/ACT IN AERO: 2.0000 | INHALATION_SPRAY | Freq: Two times a day (BID) | RESPIRATORY_TRACT | 5 refills | Status: DC

## 2019-09-20 MED ORDER — SPIRIVA RESPIMAT 1.25 MCG/ACT IN AERS: 2.0000 | INHALATION_SPRAY | Freq: Every day | RESPIRATORY_TRACT | 5 refills | Status: DC

## 2019-09-20 MED ORDER — ALBUTEROL SULFATE HFA 108 (90 BASE) MCG/ACT IN AERS: 2.0000 | INHALATION_SPRAY | RESPIRATORY_TRACT | 5 refills | Status: DC | PRN

## 2019-09-20 NOTE — Addendum Note (Signed)
Addended by: Vanessa Barbara on: 09/20/2019 12:09 PM   Modules accepted: Orders

## 2019-09-20 NOTE — Assessment & Plan Note (Signed)
Interstitial lung disease with UIP pattern with progressive increased symptom burden. Patient is now on Esbriet.  Needs lab work today for hepatic function Will refer to pulmonary rehab for physical deconditioning and to help with activity tolerance  Plan  Patient Instructions  Refer to pulmonary rehab.  Set up for overnight oximetry test .  Continue on Esbriet Three times a day  .  Labs today .  Continue on Symbicort and Spiriva.  Albuterol As needed  For wheezing /shortness of breath .  Follow up with Dr. Chase [name] in 3 months and As needed

## 2019-09-20 NOTE — Progress Notes (Signed)
_0  ID: Erica [name], female    DOB: 1955/09/20, 64 y.o.   MRN: [id number]  Chief Complaint  Patient presents with  . Follow-up    ILD     Referring provider: Nolene Ebbs, MD  HPI: 64 year old female former smoker followed for COPD and interstitial lung disease Past medical history significant for rectal cancer in 2014 status post resection Polysubstance abuse-cocaine Medical history significant for diabetes renal insufficiency  TEST/EVENTS :  PFTwhich showed mild COPD with an FEV1 of 1. 72 or84% of predicted. Diffusion capacity was 61%. sleep study was (-) for OSA HRCT chest done on 07/06/16 showed changes c/w IPF/UIP pattern, mildly worse that 2016 and 2015 ct scan.  CT chest 2019 >Progressive pulmonary parenchymal pattern of fibrosis, as described above. Findings are consistent with UIP per consensus Guidelines HRCT chest 03/2019 >The appearance of the lungs remains compatible with interstitial lung disease, with a spectrum of findings considered diagnostic of usual interstitial pneumonia (UIP) per current ATS guidelines. Minimal progression compared to the prior study.  Autoimmune/CTD labs negative   Started on Esbriet 06/2019 .   ONO 05/2019 negative.   09/20/2019 Follow up : ILD , COPD  Patient presents for a 1 month follow-up.  Patient has underlying interstitial lung disease.  She is recently started on Esbriet in January 2021.  She says she is tolerating well.  She has no significant side effects such as GI distress or nausea vomiting or diarrhea.  Weight has been stable.  She says she has minimal dry cough.  She does get short of breath with activity.  Has decreased activity tolerance seems to be getting slowly worse.  She is not as active due to this.  She had a overnight oximetry test done in December 2020.  This was normal.  However seems that she is having increased symptom burden.  She has received both of her Covid vaccines. She has underlying COPD  with emphysema.  She is on Symbicort and Spiriva.  Denies any flare of cough or wheezing.  Has rare albuterol use.     Allergies  Allergen Reactions  . Penicillins Hives, Itching and Swelling    Tongue swells up Has patient had a PCN reaction causing immediate rash, facial/tongue/throat swelling, SOB or lightheadedness with hypotension: Yes Has patient had a PCN reaction causing severe rash involving mucus membranes or skin necrosis: Yes Has patient had a PCN reaction that required hospitalization Yes Has patient had a PCN reaction occurring within the last 10 years: No If all of the above answers are "NO", then may proceed with Cephalosporin use.     Immunization History  Administered Date(s) Administered  . Influenza Split 03/13/2017  . Influenza Whole 04/26/2018  . Influenza,inj,Quad PF,6+ Mos 03/09/2019  . Influenza-Unspecified 03/13/2016  . PFIZER SARS-COV-2 Vaccination 08/19/2019, 09/18/2019    Past Medical History:  Diagnosis Date  . Arthritis    especially in shoulders  . Asthma   . Depression   . Diabetes mellitus   . GERD (gastroesophageal reflux disease)   . Headache(784.0)    "mild"  . Hypertension   . Mental disorder    depression  . Neuropathy    feet   . Pain    arthritis pain - takes tramadol as needed  . Rectal polyp    very little bleeding with bowel movements- no pain    Tobacco History: Social History   Tobacco Use  Smoking Status Former Smoker  . Packs/day: 0.50  . Years: 20.00  .  Pack years: 10.00  . Types: Cigarettes  . Quit date: 01/05/2018  . Years since quitting: 1.7  Smokeless Tobacco Never Used  Tobacco Comment   Unknown    Counseling given: Not Answered Comment: Unknown    Outpatient Medications Prior to Visit  Medication Sig Dispense Refill  . albuterol (PROVENTIL HFA;VENTOLIN HFA) 108 (90 BASE) MCG/ACT inhaler Inhale 2 puffs into the lungs every 4 (four) hours as needed for wheezing.    Marland Kitchen albuterol (PROVENTIL) (2.5  MG/3ML) 0.083% nebulizer solution Take 3 mLs (2.5 mg total) by nebulization every 6 (six) hours as needed for wheezing or shortness of breath. 75 mL 12  . amLODipine (NORVASC) 10 MG tablet Take 10 mg by mouth daily.   2  . gabapentin (NEURONTIN) 300 MG capsule Take 300 mg by mouth 3 (three) times daily.     . hydrochlorothiazide (MICROZIDE) 12.5 MG capsule Take 1 capsule (12.5 mg total) by mouth daily. 30 capsule 0  . losartan (COZAAR) 50 MG tablet Take 1 tablet (50 mg total) by mouth daily. 30 tablet 0  . metoprolol succinate (TOPROL-XL) 50 MG 24 hr tablet TAKE 1 TABLET(50 MG) BY MOUTH DAILY WITH OR IMMEDIATELY FOLLOWING A MEAL 90 tablet 3  . Multiple Vitamin (MULTIVITAMIN WITH MINERALS) TABS tablet Take 1 tablet by mouth daily.    . pantoprazole (PROTONIX) 40 MG tablet Take 40 mg by mouth daily.    . Pirfenidone (ESBRIET) 267 MG TABS Take 3 tablets (801 mg total) by mouth in the morning, at noon, and at bedtime. 270 tablet 11  . sertraline (ZOLOFT) 50 MG tablet Take 1 tablet (50 mg total) by mouth daily. 30 tablet 0  . SYMBICORT 160-4.5 MCG/ACT inhaler Inhale 2 puffs into the lungs 2 (two) times daily.     . Tiotropium Bromide Monohydrate (SPIRIVA RESPIMAT) 1.25 MCG/ACT AERS Inhale 2 puffs into the lungs daily. 24 g 0  . TOUJEO SOLOSTAR 300 UNIT/ML SOPN Inject 100 Units into the skin at bedtime.   1  . atorvastatin (LIPITOR) 20 MG tablet Take 1 tablet (20 mg total) by mouth daily. (Patient not taking: Reported on 09/20/2019) 30 tablet 2  . nortriptyline (PAMELOR) 25 MG capsule Take 25 mg by mouth at bedtime as needed for sleep.   5   No facility-administered medications prior to visit.     Review of Systems:   Constitutional:   No  weight loss, night sweats,  Fevers, chills,  +fatigue, or  lassitude.  HEENT:   No headaches,  Difficulty swallowing,  Tooth/dental problems, or  Sore throat,                No sneezing, itching, ear ache, nasal congestion, post nasal drip,   CV:  No chest  pain,  Orthopnea, PND, swelling in lower extremities, anasarca, dizziness, palpitations, syncope.   GI  No heartburn, indigestion, abdominal pain, nausea, vomiting, diarrhea, change in bowel habits, loss of appetite, bloody stools.   Resp:    No chest wall deformity  Skin: no rash or lesions.  GU: no dysuria, change in color of urine, no urgency or frequency.  No flank pain, no hematuria   MS:  No joint pain or swelling.  No decreased range of motion.  No back pain.    Physical Exam  BP 126/90 (BP Location: Left Arm, Cuff Size: Normal)   Pulse 92   Temp 98.2 F (36.8 C) (Temporal)   Ht _0  (1.6 m)   Wt 192 lb 12.8 oz (  87.5 kg)   SpO2 93%   BMI 34.15 kg/m   GEN: A/Ox3; pleasant , NAD, well nourished    HEENT:  [place]/AT,    NOSE-clear, THROAT-clear, no lesions, no postnasal drip or exudate noted.   NECK:  Supple w/ fair ROM; no JVD; normal carotid impulses w/o bruits; no thyromegaly or nodules palpated; no lymphadenopathy.    RESP  BB crackles . no accessory muscle use, no dullness to percussion  CARD:  RRR, no m/r/g, no peripheral edema, pulses intact, no cyanosis or clubbing.  GI:   Soft & nt; nml bowel sounds; no organomegaly or masses detected.   Musco: Warm bil, no deformities or joint swelling noted.   Neuro: alert, no focal deficits noted.    Skin: Warm, no lesions or rashes    Lab Results:  CBC  BMET  BNP No results found for: BNP  ProBNP No results found for: PROBNP  Imaging: No results found.    PFT Results Latest Ref Rng & Units 06/13/2019 10/24/2017 08/11/2016  FVC-Pre L 2.52 2.79 2.56  FVC-Predicted Pre % 98 108 98  FVC-Post L 2.63 - 2.99  FVC-Predicted Post % 102 - 114  Pre FEV1/FVC % % 81 69 67  Post FEV1/FCV % % 81 - 67  FEV1-Pre L 2.05 1.93 1.72  FEV1-Predicted Pre % 102 95 84  FEV1-Post L 2.13 - 2.01  DLCO UNC% % 38 - 61  DLCO COR %Predicted % 47 - 66  TLC L 4.48 - 5.31  TLC % Predicted % 88 - 105  RV % Predicted % 75 - 136     No results found for: NITRICOXIDE      Assessment & Plan:   Interstitial lung disease (HCC) Interstitial lung disease with UIP pattern with progressive increased symptom burden. Patient is now on Esbriet.  Needs lab work today for hepatic function Will refer to pulmonary rehab for physical deconditioning and to help with activity tolerance  Plan  Patient Instructions  Refer to pulmonary rehab.  Set up for overnight oximetry test .  Continue on Esbriet Three times a day  .  Labs today .  Continue on Symbicort and Spiriva.  Albuterol As needed  For wheezing /shortness of breath .  Follow up with Dr. Chase Caller in 3 months and As needed          COPD (chronic obstructive pulmonary disease) ([place]) Stable on current regimen no changes     Rexene Edison, NP 09/20/2019

## 2019-09-20 NOTE — Patient Instructions (Addendum)
Refer to pulmonary rehab.  Set up for overnight oximetry test .  Continue on Esbriet Three times a day  .  Labs today .  Continue on Symbicort and Spiriva.  Albuterol As needed  For wheezing /shortness of breath .  Follow up with Dr. Chase Caller in 3 months and As needed

## 2019-09-20 NOTE — Progress Notes (Signed)
Received referral from Dr. Chase Caller for this pt to participate in pulmonary rehab with the the diagnosis of ILD. Reviewed pt medical history through epic.  Pt with history of polysubstance abuse and reports quiting in 04/2019 after hospitalization for suicidal attempt.  Pt has also abstained from alcohol.  Pt  seen by Dr. Einar Gip team for calcification seen on CT.  Pt completed lexiscan which showed low risk. Clinical review of pt follow up appt on 4/9 with Dr. Chase Caller - Pulmonary office note.  Pt with Covid Risk Score -5 . Pt appropriate for scheduling for Pulmonary rehab.  Will forward to support staff for verification of insurance eligibility/benefits and pulmonary rehab staff for scheduling with pt consent. Cherre Huger, BSN Cardiac and Training and development officer

## 2019-09-20 NOTE — Assessment & Plan Note (Signed)
Stable on current regimen no changes

## 2019-09-23 NOTE — Progress Notes (Signed)
Patient identification verified. Results of recent lab work reviewed. Per Rexene Edison NP, LFTs look good. Please continue with office visit recommendations. Patient verbalized understanding of results and plan of care.

## 2019-09-24 ENCOUNTER — Telehealth (HOSPITAL_COMMUNITY): Payer: Self-pay

## 2019-09-24 NOTE — Telephone Encounter (Signed)
Pt insurance is active and benefits verified through [facility] Co-pay 0, DED $203/$203 met, out of pocket $3,450/0 met, co-insurance 20%. no pre-authorization required, REF# [id number]

## 2019-10-01 ENCOUNTER — Other Ambulatory Visit: Payer: Self-pay

## 2019-10-01 ENCOUNTER — Encounter: Payer: Self-pay | Admitting: Pulmonary Disease

## 2019-10-01 ENCOUNTER — Ambulatory Visit (INDEPENDENT_AMBULATORY_CARE_PROVIDER_SITE_OTHER): Payer: Medicare HMO | Admitting: Pulmonary Disease

## 2019-10-01 ENCOUNTER — Telehealth (HOSPITAL_COMMUNITY): Payer: Self-pay

## 2019-10-01 ENCOUNTER — Telehealth: Payer: Self-pay | Admitting: Internal Medicine

## 2019-10-01 DIAGNOSIS — F332 Major depressive disorder, recurrent severe without psychotic features: Secondary | ICD-10-CM | POA: Diagnosis not present

## 2019-10-01 DIAGNOSIS — J849 Interstitial pulmonary disease, unspecified: Secondary | ICD-10-CM | POA: Diagnosis not present

## 2019-10-01 DIAGNOSIS — R079 Chest pain, unspecified: Secondary | ICD-10-CM | POA: Diagnosis not present

## 2019-10-01 DIAGNOSIS — J439 Emphysema, unspecified: Secondary | ICD-10-CM

## 2019-10-01 NOTE — Progress Notes (Signed)
Virtual Visit via Telephone Note  I connected with Erica [name] on 10/01/19 at  3:00 PM EDT by telephone and verified that I am speaking with the correct person using two identifiers.  Location: Patient: Home Provider: [facility] - [address], [place], [place], [place] [address]   I discussed the limitations, risks, security and privacy concerns of performing an evaluation and management service by telephone and the availability of in person appointments. I also discussed with the patient that there may be a patient responsible charge related to this service. The patient expressed understanding and agreed to proceed.  Patient consented to consult via telephone: Yes People present and their role in pt care: Pt    History of Present Illness:  64 year old female former smoker followed in our office for COPD and interstitial lung disease  Past medical history: Rectal cancer, GERD Smoking history: Former smoker, quit 2019, 10-pack-year smoking history Maintenance: Symbicort 160, Spiriva Respimat 1.25, Esbriet Patient of Dr. Chase Caller  Chief complaint: Chest pain  64 year old female former smoker completing a televisit with our office.  Patient reporting that she has had increased chest pain that is intermittent over the last 2 weeks.  She reports that the pattern that is coupled with her stress.  She does occasionally have some heart racing.  She is unable to tell us if her oxygen levels are dropping.  She does not have a pulse oximeter.  She reports she is under considerable amount of environmental stress due to her living situation as well as family.  She has known history of polysubstance abuse as well as mental health.  She is followed by primary care.  She has not yet contacted them.  Observations/Objective:  04/12/2019-high-resolution CT chest-appearance of the lungs remains compatible with interstitial lung disease consider diagnostic for UIP, minimal progression  compared to prior study, aortic arthrosclerosis  08/11/2016-pulmonary function test-FVC 2.56 (98% predicted), postbronchodilator ratio 67, postbronchodilator FEV1 2.01 (98% predicted), positive bronchodilator response, DLCO 14.81 (61% predicted)  Social History   Tobacco Use  Smoking Status Former Smoker  . Packs/day: 0.50  . Years: 20.00  . Pack years: 10.00  . Types: Cigarettes  . Quit date: 01/05/2018  . Years since quitting: 1.7  Smokeless Tobacco Never Used  Tobacco Comment   Unknown    Immunization History  Administered Date(s) Administered  . Influenza Split 03/13/2017  . Influenza Whole 04/26/2018  . Influenza,inj,Quad PF,6+ Mos 03/09/2019  . Influenza-Unspecified 03/13/2016  . PFIZER SARS-COV-2 Vaccination 08/19/2019, 09/18/2019      Assessment and Plan:  Interstitial lung disease ([place]) Plan: Continue with pulmonary rehab Continue Esbriet Keep follow-up in July/2021  COPD (chronic obstructive pulmonary disease) ([place]) Plan: Continue Symbicort 160 Continue Spiriva Respimat Continue forward with pulmonary rehab  MDD (major depressive disorder), recurrent severe, without psychosis ([place]) Patient denies suicidal and homicidal ideations today Worsened anxiety Significant emotional stress  Plan: Please schedule an appointment with primary care for further evaluation and management of anxiety/depression  Chest pain History of cocaine abuse Atypical chest pain for 2 weeks Stress/anxiety worsens chest pain  Plan: Explained to patient that this is difficult to fully assess telephonically.  If symptoms worsen she will need to seek emergent care evaluation in urgent care or emergency room  Most likely her chest pain is directly related to her worsened emotional stress regarding her living situation family.  I encouraged her to follow-up with primary care.  She is comfortable with that plan.   Follow Up Instructions:  Return in about 3 months (around 12/31/2019),  or if symptoms worsen or fail to improve, for Follow up with Dr. Purnell Shoemaker.   I discussed the assessment and treatment plan with the patient. The patient was provided an opportunity to ask questions and all were answered. The patient agreed with the plan and demonstrated an understanding of the instructions.   The patient was advised to call back or seek an in-person evaluation if the symptoms worsen or if the condition fails to improve as anticipated.  I provided 24 minutes of non-face-to-face time during this encounter.   Lauraine Rinne, NP

## 2019-10-01 NOTE — Assessment & Plan Note (Signed)
History of cocaine abuse Atypical chest pain for 2 weeks Stress/anxiety worsens chest pain  Plan: Explained to patient that this is difficult to fully assess telephonically.  If symptoms worsen she will need to seek emergent care evaluation in urgent care or emergency room  Most likely her chest pain is directly related to her worsened emotional stress regarding her living situation family.  I encouraged her to follow-up with primary care.  She is comfortable with that plan.

## 2019-10-01 NOTE — Assessment & Plan Note (Signed)
Patient denies suicidal and homicidal ideations today Worsened anxiety Significant emotional stress  Plan: Please schedule an appointment with primary care for further evaluation and management of anxiety/depression

## 2019-10-01 NOTE — Patient Instructions (Addendum)
You were seen today by Lauraine Rinne, NP  for:   1. Interstitial lung disease ([place])  Continue Esbriet  Continue forward with pulmonary rehab  2. Pulmonary emphysema, unspecified emphysema type (HCC)  Continue Symbicort 160  Continue Spiriva Respimat 1.25  3. MDD (major depressive disorder), recurrent severe, without psychosis ([place])  I am sorry for your worsened emotional stress as well as anxiety and depression  Please contact primary care for an earlier office visit  If you start to have suicidal or homicidal thoughts please contact 911  4. Chest pain, unspecified type  I agree with you believe that your chest pain is directly related to the emotional stress that you been under  If symptoms worsen such as: More regular or persistent chest pain, significant shortness of breath, heart racing, coughing up blood please seek emergent evaluation.  Follow-up with primary care regarding your worsened anxiety   Follow Up:    Return in about 3 months (around 12/31/2019), or if symptoms worsen or fail to improve, for Follow up with Dr. Purnell Shoemaker.  44mn appt   Please do your part to reduce the spread of COVID-19:      Reduce your risk of any infection  and COVID19 by using the similar precautions used for avoiding the common cold or flu:  .Marland KitchenWash your hands often with soap and warm water for at least 20 seconds.  If soap and water are not readily available, use an alcohol-based hand sanitizer with at least 60% alcohol.  . If coughing or sneezing, cover your mouth and nose by coughing or sneezing into the elbow areas of your shirt or coat, into a tissue or into your sleeve (not your hands). .Langley GaussA MASK when in public  . Avoid shaking hands with others and consider head nods or verbal greetings only. . Avoid touching your eyes, nose, or mouth with unwashed hands.  . Avoid close contact with people who are sick. . Avoid places or events with large numbers of people in one  location, like concerts or sporting events. . If you have some symptoms but not all symptoms, continue to monitor at home and seek medical attention if your symptoms worsen. . If you are having a medical emergency, call 911.   AMikes/ e-Visit: [email or web address]        [facility]: [facility]: [phone]                  [facility] [phone]    It is flu season:   >>> Best ways to protect herself from the flu: Receive the yearly flu vaccine, practice good hand hygiene washing with soap and also using hand sanitizer when available, eat a nutritious meals, get adequate rest, hydrate appropriately   Please contact the office if your symptoms worsen or you have concerns that you are not improving.   Thank you for choosing [facility] for your healthcare, and for allowing [facility] partner with you on your healthcare journey. I am thankful to be able to provide care to you today.   BWyn QuakerFNP-C

## 2019-10-01 NOTE — Assessment & Plan Note (Signed)
Plan: Continue Symbicort 160 Continue Spiriva Respimat Continue forward with pulmonary rehab

## 2019-10-01 NOTE — Assessment & Plan Note (Signed)
Plan: Continue with pulmonary rehab Continue Esbriet Keep follow-up in July/2021

## 2019-10-03 NOTE — Telephone Encounter (Signed)
Error.

## 2019-10-07 MED FILL — ESBRIET 267 MG TABS: 267 | 30 days supply | Qty: 270 | Fill #2

## 2019-10-16 ENCOUNTER — Telehealth: Payer: Self-pay | Admitting: Internal Medicine

## 2019-10-16 NOTE — Telephone Encounter (Signed)
Letter has been typed. Will give to MR tomorrow to sign.

## 2019-10-16 NOTE — Telephone Encounter (Signed)
Ys do letter  To Whom It May Concern  Erica [name] with date of birth [birth year] and address [place] [address] - has pulmonary fibrosis that makes her very limited in activities and short of breath. Therefore, it is imperative that landlord move her to downstairs unit  Please do not hesitate to contact me if you have any questions  Thanks    SIGNATURE    Dr. Brand Males, M.D., F.C.C.P,  Pulmonary and Critical Care Medicine Staff Physician, [place] Director - Interstitial Lung Disease  Program  Pulmonary [place] at [place], [place], [address]  5:17 PM 10/16/2019

## 2019-10-16 NOTE — Telephone Encounter (Signed)
Spoke with pt, states she is trying to move and is requesting a letter from our office stating that she needs to be on the ground floor d/t her lung condition.  Pt states this has been discussed previously.  I reviewed last two office visit notes from 10/01/19 and 09/20/19, but I did not see any documentation reflecting this.  Also no notes in pt's letter tab in chart.  Pt would like this mailed to her.  Verified address on file.  MR please advise if ok to write letter, and please advise on what to include on letter.  Thanks!

## 2019-10-17 ENCOUNTER — Telehealth (HOSPITAL_COMMUNITY): Payer: Self-pay | Admitting: *Deleted

## 2019-10-17 NOTE — Telephone Encounter (Signed)
LM for appointment in pulmonary rehab orientation/walk test 10/21/2019 @ 1030.  LM to call [phone] to confirm.

## 2019-10-17 NOTE — Telephone Encounter (Signed)
Left message for patient to call back. The letter has been signed and placed in the mail.

## 2019-10-18 ENCOUNTER — Telehealth: Payer: Self-pay | Admitting: Adult Health

## 2019-10-18 NOTE — Telephone Encounter (Signed)
Received ONO results from 10/01/2019.  Per Tammy >> ONO normal, only 4 minutes out of > 5.5hr showed decreased oxygen. Would hold off on oxygen therapy at this time.  Spoke with pt. She is aware of results. Nothing further was needed.

## 2019-10-21 ENCOUNTER — Ambulatory Visit (HOSPITAL_COMMUNITY): Payer: Medicare HMO

## 2019-10-21 ENCOUNTER — Encounter (HOSPITAL_COMMUNITY): Payer: Self-pay | Admitting: *Deleted

## 2019-10-21 NOTE — Progress Notes (Signed)
Patient was a no call, no show for orientation/walk test for pulmonary rehab.  Referral will be cancelled.

## 2019-10-22 NOTE — Telephone Encounter (Signed)
LMTCB x2 for pt 

## 2019-10-23 NOTE — Telephone Encounter (Signed)
ATC patient left message. Spoke with Cherina about letter and she stated that the letter was mailed to the patient. This is the 3rd attempt to contact patient per protocol will close encounter.

## 2019-10-25 ENCOUNTER — Telehealth (HOSPITAL_COMMUNITY): Payer: Self-pay | Admitting: Internal Medicine

## 2019-10-26 ENCOUNTER — Other Ambulatory Visit (HOSPITAL_COMMUNITY): Payer: Medicare HMO | Attending: Internal Medicine

## 2019-10-28 ENCOUNTER — Other Ambulatory Visit: Payer: Self-pay | Admitting: Internal Medicine

## 2019-10-28 ENCOUNTER — Telehealth: Payer: Self-pay | Admitting: Adult Health

## 2019-10-28 DIAGNOSIS — Z1231 Encounter for screening mammogram for malignant neoplasm of breast: Secondary | ICD-10-CM

## 2019-10-28 NOTE — Telephone Encounter (Signed)
Attempted to reach Almyra Free but the number provided is only for automated responses. Will attempt to call back later.   Patient is scheduled for a PFT tomorrow but she did not complete her COVID test on 5/15. PFT will need to be rescheduled.

## 2019-10-30 NOTE — Telephone Encounter (Signed)
ATC Almyra Free and could not get through to her  ATC the pt and had to [facility]

## 2019-10-31 LAB — COMPREHENSIVE METABOLIC PANEL
ALT: 17 IU/L (ref 0–32)
AST: 28 IU/L (ref 0–40)
Albumin/Globulin Ratio: 1.1 — ABNORMAL LOW (ref 1.2–2.2)
Albumin: 3.9 g/dL (ref 3.8–4.8)
Alkaline Phosphatase: 100 IU/L (ref 48–121)
BUN/Creatinine Ratio: 15 (ref 12–28)
BUN: 19 mg/dL (ref 8–27)
Bilirubin Total: 0.5 mg/dL (ref 0.0–1.2)
CO2: 21 mmol/L (ref 20–29)
Calcium: 9.1 mg/dL (ref 8.7–10.3)
Chloride: 107 mmol/L — ABNORMAL HIGH (ref 96–106)
Creatinine, Ser: 1.31 mg/dL — ABNORMAL HIGH (ref 0.57–1.00)
GFR calc Af Amer: 49 mL/min/{1.73_m2} — ABNORMAL LOW (ref >59)
GFR calc non Af Amer: 43 mL/min/{1.73_m2} — ABNORMAL LOW (ref >59)
Globulin, Total: 3.7 g/dL (ref 1.5–4.5)
Glucose: 78 mg/dL (ref 65–99)
Potassium: 4.2 mmol/L (ref 3.5–5.2)
Sodium: 140 mmol/L (ref 134–144)
Total Protein: 7.6 g/dL (ref 6.0–8.5)

## 2019-10-31 LAB — LIPID PANEL
Chol/HDL Ratio: 2.5 ratio (ref 0.0–4.4)
Cholesterol, Total: 144 mg/dL (ref 100–199)
HDL: 58 mg/dL (ref >39)
LDL Chol Calc (NIH): 58 mg/dL (ref 0–99)
Triglycerides: 169 mg/dL — ABNORMAL HIGH (ref 0–149)
VLDL Cholesterol Cal: 28 mg/dL (ref 5–40)

## 2019-10-31 NOTE — Progress Notes (Signed)
Significant improvement and cholesterol, triglycerides are still mildly elevated continue protocol with diet change.  Renal function is stable, liver enzymes normal.  Discuss on office visit.

## 2019-10-31 NOTE — Telephone Encounter (Signed)
LMTCB x2 for pt 

## 2019-11-01 NOTE — Telephone Encounter (Signed)
We have attempted to contact Almyra Free and the pt several times with no success or call back from them. Per triage protocol, message will be closed.

## 2019-11-04 ENCOUNTER — Ambulatory Visit: Payer: Medicare HMO | Admitting: Cardiology

## 2019-11-19 ENCOUNTER — Ambulatory Visit: Payer: Medicare HMO | Admitting: Cardiology

## 2019-11-19 ENCOUNTER — Encounter: Payer: Self-pay | Admitting: Cardiology

## 2019-11-19 ENCOUNTER — Other Ambulatory Visit: Payer: Self-pay

## 2019-11-19 VITALS — BP 155/80 | HR 73 | Ht 63.0 in | Wt 192.0 lb

## 2019-11-19 DIAGNOSIS — J439 Emphysema, unspecified: Secondary | ICD-10-CM

## 2019-11-19 DIAGNOSIS — E119 Type 2 diabetes mellitus without complications: Secondary | ICD-10-CM

## 2019-11-19 DIAGNOSIS — I2510 Atherosclerotic heart disease of native coronary artery without angina pectoris: Secondary | ICD-10-CM

## 2019-11-19 DIAGNOSIS — N183 Chronic kidney disease, stage 3 unspecified: Secondary | ICD-10-CM

## 2019-11-19 DIAGNOSIS — E781 Pure hyperglyceridemia: Secondary | ICD-10-CM

## 2019-11-19 DIAGNOSIS — E782 Mixed hyperlipidemia: Secondary | ICD-10-CM

## 2019-11-19 DIAGNOSIS — I129 Hypertensive chronic kidney disease with stage 1 through stage 4 chronic kidney disease, or unspecified chronic kidney disease: Secondary | ICD-10-CM

## 2019-11-19 DIAGNOSIS — Z87891 Personal history of nicotine dependence: Secondary | ICD-10-CM

## 2019-11-19 DIAGNOSIS — F1491 Cocaine use, unspecified, in remission: Secondary | ICD-10-CM

## 2019-11-19 MED ORDER — FENOFIBRATE 145 MG PO TABS: 145.0000 mg | ORAL_TABLET | Freq: Every day | ORAL | 0 refills | Status: DC

## 2019-11-19 NOTE — Progress Notes (Signed)
Erica [name] Date of Birth: 31-Dec-1955 MRN: [id number] Primary Care Provider:Avbuere, Christean Grief, MD Former Cardiology Providers: Dr. Adrian Prows, Jeri Lager, APRN, FNP-C Primary Cardiologist: Rex Kras, DO, [facility] (established care 11/19/2019)  Date: 11/19/19 Last Visit: 08/06/2019  Chief Complaint  Patient presents with  . Coronary Artery Disease    History of coronary artery calcification  . Hyperlipidemia    PT not on statin due to side effect and cost  . Results    Labs  . Follow-up    3 month    HPI  Erica [name] is a 64 y.o.  female who presents to the office with a chief complaint of " review test results, management of hyperlipidemia." Patient's past medical history and cardiovascular risk factors include: rectal CA in 2014 that is in remission, T2DM, former tobacco use, emphysema, interstitial lung disease, idiopathic pulmonary fibrosis, and was initially evaluated by [place] for coronary artery calcification on CT scan.   In the interim, patient has undergone a nuclear stress test as well as echocardiogram results are were reviewed with her during prior office visits and noted below for further reference.  Since the patient symptoms were well controlled the goal was to improve her modifiable cardiovascular risk factors.   Patient's office blood pressure is not well controlled. Patient does not check her blood pressures at home on a regular basis. She states that she will invest in a blood pressure cuff to do this more regularly. She currently takes losartan and amlodipine. Is educated on importance of a low-salt diet.  In regard to hyperlipidemia patient was on Crestor initially.  But she developed myalgias and was transitioned to Lipitor 20 mg p.o. nightly. However, patient states that she currently is not taking statin medications as Lipitor was not approved by her insurance company. I do not have her most recent medication list or medication bottles to verify this information.  Patient is asked to call the office back to reconcile her meds more accurately. Her lipid profile has improved and her LDL level is less than 70 mg/dL.  However, patient triglyceride levels are still not well controlled.  Since last office visit patient has not had any chest pain at rest or with effort related activities. Patient states that her shortness of breath is relatively stable. She does have good days and bad days. But her intensity, frequency and the duration of effort related dyspnea has not worsened. She has other chronic pulmonary conditions that she is currently working with Dr. Chase Caller. She has an upcoming appointment to see him within the next 1 week.  Independently reviewed the most recent blood work from 10/30/2019.  ALLERGIES: Allergies  Allergen Reactions  . Penicillins Hives, Itching and Swelling    Tongue swells up Has patient had a PCN reaction causing immediate rash, facial/tongue/throat swelling, SOB or lightheadedness with hypotension: Yes Has patient had a PCN reaction causing severe rash involving mucus membranes or skin necrosis: Yes Has patient had a PCN reaction that required hospitalization Yes Has patient had a PCN reaction occurring within the last 10 years: No If all of the above answers are "NO", then may proceed with Cephalosporin use.      MEDICATION LIST PRIOR TO VISIT: Current Outpatient Medications on File Prior to Visit  Medication Sig Dispense Refill  . albuterol (PROVENTIL) (2.5 MG/3ML) 0.083% nebulizer solution Take 3 mLs (2.5 mg total) by nebulization every 6 (six) hours as needed for wheezing or shortness of breath. 75 mL 12  . albuterol (  VENTOLIN HFA) 108 (90 Base) MCG/ACT inhaler Inhale 2 puffs into the lungs every 4 (four) hours as needed for wheezing. 1 g 5  . amLODipine (NORVASC) 10 MG tablet Take 10 mg by mouth daily.   2  . aspirin EC 81 MG tablet Take 81 mg by mouth daily.    Marland Kitchen gabapentin (NEURONTIN) 300 MG capsule Take 300 mg by  mouth 3 (three) times daily.     . hydrochlorothiazide (MICROZIDE) 12.5 MG capsule Take 1 capsule (12.5 mg total) by mouth daily. 30 capsule 0  . losartan (COZAAR) 50 MG tablet Take 1 tablet (50 mg total) by mouth daily. 30 tablet 0  . metoprolol succinate (TOPROL-XL) 50 MG 24 hr tablet TAKE 1 TABLET(50 MG) BY MOUTH DAILY WITH OR IMMEDIATELY FOLLOWING A MEAL 90 tablet 3  . Multiple Vitamin (MULTIVITAMIN WITH MINERALS) TABS tablet Take 1 tablet by mouth daily.    . pantoprazole (PROTONIX) 40 MG tablet Take 40 mg by mouth daily.    . Pirfenidone (ESBRIET) 267 MG TABS Take 3 tablets (801 mg total) by mouth in the morning, at noon, and at bedtime. 270 tablet 11  . sertraline (ZOLOFT) 50 MG tablet Take 1 tablet (50 mg total) by mouth daily. 30 tablet 0  . SYMBICORT 160-4.5 MCG/ACT inhaler Inhale 2 puffs into the lungs 2 (two) times daily. 1 Inhaler 5  . Tiotropium Bromide Monohydrate (SPIRIVA RESPIMAT) 1.25 MCG/ACT AERS Inhale 2 puffs into the lungs daily. 24 g 5  . TOUJEO SOLOSTAR 300 UNIT/ML SOPN Inject 90 Units into the skin at bedtime. 50 in th A.M. AND 40 IN THE P.M.  1   No current facility-administered medications on file prior to visit.    PAST MEDICAL HISTORY: Past Medical History:  Diagnosis Date  . Arthritis    especially in shoulders  . Asthma   . Depression   . Diabetes mellitus   . GERD (gastroesophageal reflux disease)   . Headache(784.0)    "mild"  . Hypertension   . Mental disorder    depression  . Neuropathy    feet   . Pain    arthritis pain - takes tramadol as needed  . Rectal polyp    very little bleeding with bowel movements- no pain    PAST SURGICAL HISTORY: Past Surgical History:  Procedure Laterality Date  . ANAL RECTAL MANOMETRY N/A 07/13/2016   Procedure: ANO RECTAL MANOMETRY;  Surgeon: Leighton Ruff, MD;  Location: [facility];  Service: Endoscopy;  Laterality: N/A;  . CESAREAN SECTION    . EUS N/A 11/21/2012   Procedure: LOWER ENDOSCOPIC ULTRASOUND  (EUS);  Surgeon: Arta Silence, MD;  Location: [facility];  Service: Endoscopy;  Laterality: N/A;  . FLEXIBLE SIGMOIDOSCOPY N/A 11/21/2012   Procedure: FLEXIBLE SIGMOIDOSCOPY;  Surgeon: Arta Silence, MD;  Location: [facility];  Service: Endoscopy;  Laterality: N/A;  . FLEXIBLE SIGMOIDOSCOPY N/A 01/28/2013   Procedure: FLEXIBLE SIGMOIDOSCOPY;  Surgeon: Leighton Ruff, MD;  Location: [facility];  Service: Endoscopy;  Laterality: N/A;  . LAPAROSCOPIC LOW ANTERIOR RESECTION N/A 01/29/2013   Procedure: LAPAROSCOPIC LOW ANTERIOR RESECTION, Rigid Proctoscopy;  Surgeon: Leighton Ruff, MD;  Location: [facility]S;  Service: General;  Laterality: N/A;  . LAPAROSCOPIC SIGMOID COLECTOMY N/A 11/14/2012   Procedure: DIAGNOSTIC LAPAROSCOPY AND SIGMOIDMOIDOSCOPY ;  Surgeon: Rolm Bookbinder, MD;  Location: [facility];  Service: General;  Laterality: N/A;  . RECTAL ULTRASOUND N/A 07/13/2016   Procedure: RECTAL ULTRASOUND;  Surgeon: Leighton Ruff, MD;  Location: [facility];  Service: Endoscopy;  Laterality: N/A;  . TONSILLECTOMY    . TONSILLECTOMY AND ADENOIDECTOMY      FAMILY HISTORY: The patient's family history includes Cancer in her maternal aunt, maternal uncle, maternal uncle, maternal uncle, paternal aunt, and paternal uncle; Cancer (age of onset: 67) in an other family member; Diabetes in her father; Heart disease in her mother; Hyperlipidemia in her mother; Hypertension in her brother, brother, father, mother, sister, and sister; Stroke in her father.   SOCIAL HISTORY:  The patient  reports that she quit smoking about 22 months ago. Her smoking use included cigarettes. She has a 10.00 pack-year smoking history. She has never used smokeless tobacco. She reports previous alcohol use. She reports current drug use. Drug: "Crack" cocaine.  Review of Systems  Constitution: Negative for chills and fever.  HENT: Negative for hoarse voice and nosebleeds.   Eyes: Negative for discharge, double vision and pain.    Cardiovascular: Positive for dyspnea on exertion (chronic and stable). Negative for chest pain, claudication, leg swelling, near-syncope, orthopnea, palpitations, paroxysmal nocturnal dyspnea and syncope.  Respiratory: Positive for shortness of breath. Negative for hemoptysis.   Musculoskeletal: Negative for muscle cramps and myalgias.  Gastrointestinal: Negative for abdominal pain, constipation, diarrhea, hematemesis, hematochezia, melena, nausea and vomiting.  Neurological: Positive for light-headedness (chronic and stable). Negative for dizziness.    PHYSICAL EXAM: Vitals with BMI 11/19/2019 09/20/2019 08/15/2019  Height _0  _1  -  Weight 192 lbs 192 lbs 13 oz -  BMI 99.83 38.25 -  Systolic 053 976 734  Diastolic 80 90 76  Pulse 73 92 -    CONSTITUTIONAL: Well-developed and well-nourished. No acute distress.  SKIN: Skin is warm and dry. No rash noted. No cyanosis. No pallor. No jaundice HEAD: Normocephalic and atraumatic.  EYES: No scleral icterus MOUTH/THROAT: Moist oral membranes.  NECK: No JVD present. No thyromegaly noted. No carotid bruits  LYMPHATIC: No visible cervical adenopathy.  CHEST Normal respiratory effort. No intercostal retractions  LUNGS: Clear to auscultation bilaterally.  No stridor. No wheezes. No rales.  CARDIOVASCULAR: Regular rate and rhythm, positive S1-S2, no murmurs rubs or gallops appreciated. ABDOMINAL: Obese, soft, nontender, nondistended, positive bowel sounds all 4 quadrants. No apparent ascites.  EXTREMITIES: No peripheral edema  HEMATOLOGIC: No significant bruising NEUROLOGIC: Oriented to person, place, and time. Nonfocal. Normal muscle tone.  PSYCHIATRIC: Normal mood and affect. Normal behavior. Cooperative  RADIOLOGY: CT of chest 04/12/2019:  1. The appearance of the lungs remains compatible with interstitial lung disease, with a spectrum of findings considered diagnostic of usual interstitial pneumonia (UIP) per current ATS guidelines. Minimal  progression compared to the prior study. 2. Aortic atherosclerosis, in addition to left anterior descending coronary artery disease.  3. Cholelithiasis. Aortic Atherosclerosis (ICD10-I70.0).  CARDIAC DATABASE: EKG: 07/08/2019: Normal sinus rhythm at 70 bpm, left atrial abnormality, left axis deviation, PRWP cannot exclude anterior infarct old.   Echocardiogram: 07/16/2019: LVEF 56%, moderate LVH, grade 1 diastolic impairment, moderately dilated left atrium, moderate AR, trace pericardial effusion.  Stress Testing:  Lexiscan tetrofosmin stress test 07/22/2019: There is  apical thinning an mild soft tissue attenuation artifact in inferior wall without ischemia or infarct. All segments of left ventricle demonstrated normal wall motion and thickening. Stress LV EF is moderately dysfunctional 39%, however visually appearted to be normal. No previous exam available for comparison. Low risk study.   Heart Catheterization: None  LABORATORY DATA: CBC Latest Ref Rng & Units 05/09/2019 08/29/2018 08/30/2017  WBC 4.0 - 10.5 K/uL 6.6 4.2 4.5  Hemoglobin  12.0 - 15.0 g/dL 11.5(L) 11.0(L) 11.0(L)  Hematocrit 36.0 - 46.0 % 32.7(L) 32.0(L) 31.0(L)  Platelets 150 - 400 K/uL 181 171 166    CMP Latest Ref Rng & Units 10/30/2019 09/20/2019 05/11/2019  Glucose 65 - 99 mg/dL 78 - 502(HH)  BUN 8 - 27 mg/dL 19 - 35(H)  Creatinine 0.57 - 1.00 mg/dL 1.31(H) - 1.69(H)  Sodium 134 - 144 mmol/L 140 - 131(L)  Potassium 3.5 - 5.2 mmol/L 4.2 - 5.0  Chloride 96 - 106 mmol/L 107(H) - 101  CO2 20 - 29 mmol/L 21 - 22  Calcium 8.7 - 10.3 mg/dL 9.1 - 8.7(L)  Total Protein 6.0 - 8.5 g/dL 7.6 7.7 7.6  Total Bilirubin 0.0 - 1.2 mg/dL 0.5 0.6 1.0  Alkaline Phos 48 - 121 IU/L 100 98 110  AST 0 - 40 IU/L _0 ALT 0 - 32 IU/L _1 Lipid Panel     Component Value Date/Time   CHOL 144 10/30/2019 1657   TRIG 169 (H) 10/30/2019 1657   HDL 58 10/30/2019 1657   CHOLHDL 2.5 10/30/2019 1657   CHOLHDL 2.6 Ratio  08/21/2008 2129   VLDL 25 08/21/2008 2129   LDLCALC 58 10/30/2019 1657   LABVLDL 28 10/30/2019 1657    Lab Results  Component Value Date   HGBA1C 7.9 (H) 05/11/2019   No components found for: NTPROBNP Lab Results  Component Value Date   TSH 2.562 08/21/2008    Cardiac Panel (last 3 results) No results for input(s): CKTOTAL, CKMB, TROPONINIHS, RELINDX in the last 72 hours.  IMPRESSION:    ICD-10-CM   1. Coronary artery calcification seen on CT scan  I25.10 EKG 12-Lead  2. Benign hypertension with CKD (chronic kidney disease) stage III  I12.9    N18.30   3. Non-insulin dependent type 2 diabetes mellitus ([place])  E11.9   4. Mixed hyperlipidemia  E78.2   5. Former tobacco use  Z87.891   6. History of cocaine use  Z87.898   7. Pulmonary emphysema, unspecified emphysema type ([place])  J43.9   8. Hypertriglyceridemia  E78.1 fenofibrate (TRICOR) 145 MG tablet     RECOMMENDATIONS: SHALISHA CLAUSING is a 64 y.o. female whose past medical history and cardiovascular risk factors include: rectal CA in 2014 that is in remission, T2DM, former tobacco use, emphysema, interstitial lung disease, idiopathic pulmonary fibrosis, and was initially evaluated by [place] for coronary artery calcification on CT scan.   Coronary artery calcification:  Patient has undergone echocardiogram and nuclear stress test to evaluate for structural heart disease and reversible ischemia.  Details noted above for further reference.  Continue aspirin.  Patient would benefit from statin therapy but was not able to tolerate Crestor.  She was given a prescription for Lipitor at the last office visit but states that she was unable to fill it due to cost and insurance coverage.  However, her lipid profile has improved and I suspect that she is on statin therapy.  However, I do not have a medication list or bottles today for review.  She is asked to call the office once she reaches home to verify her home medications.  If the patient  is not on statin therapy she would benefit from a low-dose statin therapy given her coronary calcifications.  Patient denies any chest pain at rest or with effort related activities.  Patient does have effort related dyspnea which could be multifactorial from her underlying chronic pulmonary conditions and also uncontrolled  blood pressure.  She has follow-up appointment with Dr. Chase Caller in the next week.  Hyperlipidemia:  Based on the most recent lipid profile her cholesterol panel has improved compared to prior.  She will call the office back to see exactly what she is taking on a regular basis.  If she is not on statin therapy would still recommend a low-dose statin to optimize her lipid profile.  Patient does not endorse myalgias.  Most recent lipid profile reviewed  Patient's triglyceride levels are elevated on repeat blood work.  Will prescribe fenofibrate.  Benign essential hypertension with chronic kidney disease stage III . Office blood pressure is not at goal.  . Medications were difficult to reconcile at today's visit. . Patient is asked to invest in a blood pressure cuff and to keep a log of her blood pressure readings. . Patient states that her primary care provider is currently managing her antihypertensive medications. . I have asked her to take this log at her next office visit or to call the office sooner if her systolic blood pressures at home are consistently greater than 140 mmHg so the medications can further be uptitrated. . Low salt diet recommended. A diet that is rich in fruits, vegetables, legumes, and low-fat dairy products and low in snacks, sweets, and meats (such as the Dietary Approaches to Stop Hypertension [DASH] diet).    FINAL MEDICATION LIST END OF ENCOUNTER: Meds ordered this encounter  Medications  . fenofibrate (TRICOR) 145 MG tablet    Sig: Take 1 tablet (145 mg total) by mouth daily.    Dispense:  90 tablet    Refill:  0     Current  Outpatient Medications:  .  albuterol (PROVENTIL) (2.5 MG/3ML) 0.083% nebulizer solution, Take 3 mLs (2.5 mg total) by nebulization every 6 (six) hours as needed for wheezing or shortness of breath., Disp: 75 mL, Rfl: 12 .  albuterol (VENTOLIN HFA) 108 (90 Base) MCG/ACT inhaler, Inhale 2 puffs into the lungs every 4 (four) hours as needed for wheezing., Disp: 1 g, Rfl: 5 .  amLODipine (NORVASC) 10 MG tablet, Take 10 mg by mouth daily. , Disp: , Rfl: 2 .  aspirin EC 81 MG tablet, Take 81 mg by mouth daily., Disp: , Rfl:  .  gabapentin (NEURONTIN) 300 MG capsule, Take 300 mg by mouth 3 (three) times daily. , Disp: , Rfl:  .  hydrochlorothiazide (MICROZIDE) 12.5 MG capsule, Take 1 capsule (12.5 mg total) by mouth daily., Disp: 30 capsule, Rfl: 0 .  losartan (COZAAR) 50 MG tablet, Take 1 tablet (50 mg total) by mouth daily., Disp: 30 tablet, Rfl: 0 .  metoprolol succinate (TOPROL-XL) 50 MG 24 hr tablet, TAKE 1 TABLET(50 MG) BY MOUTH DAILY WITH OR IMMEDIATELY FOLLOWING A MEAL, Disp: 90 tablet, Rfl: 3 .  Multiple Vitamin (MULTIVITAMIN WITH MINERALS) TABS tablet, Take 1 tablet by mouth daily., Disp: , Rfl:  .  pantoprazole (PROTONIX) 40 MG tablet, Take 40 mg by mouth daily., Disp: , Rfl:  .  Pirfenidone (ESBRIET) 267 MG TABS, Take 3 tablets (801 mg total) by mouth in the morning, at noon, and at bedtime., Disp: 270 tablet, Rfl: 11 .  sertraline (ZOLOFT) 50 MG tablet, Take 1 tablet (50 mg total) by mouth daily., Disp: 30 tablet, Rfl: 0 .  SYMBICORT 160-4.5 MCG/ACT inhaler, Inhale 2 puffs into the lungs 2 (two) times daily., Disp: 1 Inhaler, Rfl: 5 .  Tiotropium Bromide Monohydrate (SPIRIVA RESPIMAT) 1.25 MCG/ACT AERS, Inhale 2 puffs into the  lungs daily., Disp: 24 g, Rfl: 5 .  TOUJEO SOLOSTAR 300 UNIT/ML SOPN, Inject 90 Units into the skin at bedtime. 50 in th A.M. AND 40 IN THE P.M., Disp: , Rfl: 1 .  fenofibrate (TRICOR) 145 MG tablet, Take 1 tablet (145 mg total) by mouth daily., Disp: 90 tablet, Rfl:  0  Orders Placed This Encounter  Procedures  . EKG 12-Lead   --Continue cardiac medications as reconciled in final medication list. --Return in about 3 months (around 02/19/2020) for re-evaluation of symptoms., Lipid follow-up.. Or sooner if needed. --Continue follow-up with your primary care physician regarding the management of your other chronic comorbid conditions.  Patient's questions and concerns were addressed to her satisfaction. She voices understanding of the instructions provided during this encounter.   This note was created using a voice recognition software as a result there may be grammatical errors inadvertently enclosed that do not reflect the nature of this encounter. Every attempt is made to correct such errors.  Rex Kras, [place], [facility]  Pager: [phone] Office: [phone]

## 2019-11-25 ENCOUNTER — Ambulatory Visit: Payer: Medicare HMO

## 2019-11-30 ENCOUNTER — Other Ambulatory Visit (HOSPITAL_COMMUNITY): Payer: Medicare HMO

## 2019-12-05 ENCOUNTER — Ambulatory Visit
Admission: RE | Admit: 2019-12-05 | Discharge: 2019-12-05 | Disposition: A | Discharge status: Home / Self Care | Payer: Medicare HMO | Source: Ambulatory Visit | Attending: Internal Medicine | Admitting: Internal Medicine

## 2019-12-05 ENCOUNTER — Other Ambulatory Visit: Payer: Self-pay

## 2019-12-05 DIAGNOSIS — Z1231 Encounter for screening mammogram for malignant neoplasm of breast: Secondary | ICD-10-CM

## 2019-12-05 IMAGING — MG DIGITAL SCREENING BILAT W/ TOMO W/ CAD
8 series · 8 of 24 positions shown · non-contrast
Comparison: Previous exam(s).

ACR Breast Density Category a: The breast tissue is almost entirely
fatty.

CLINICAL DATA: Screening.

EXAM:
DIGITAL SCREENING BILATERAL MAMMOGRAM WITH TOMO AND CAD

[R MLO synth-2D]
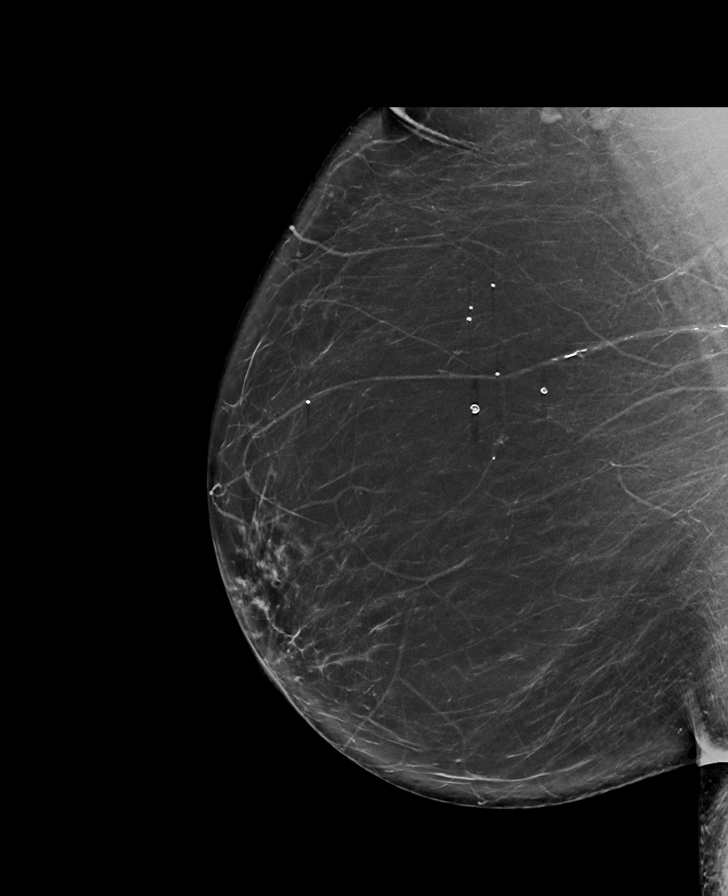

[L CC synth-2D]
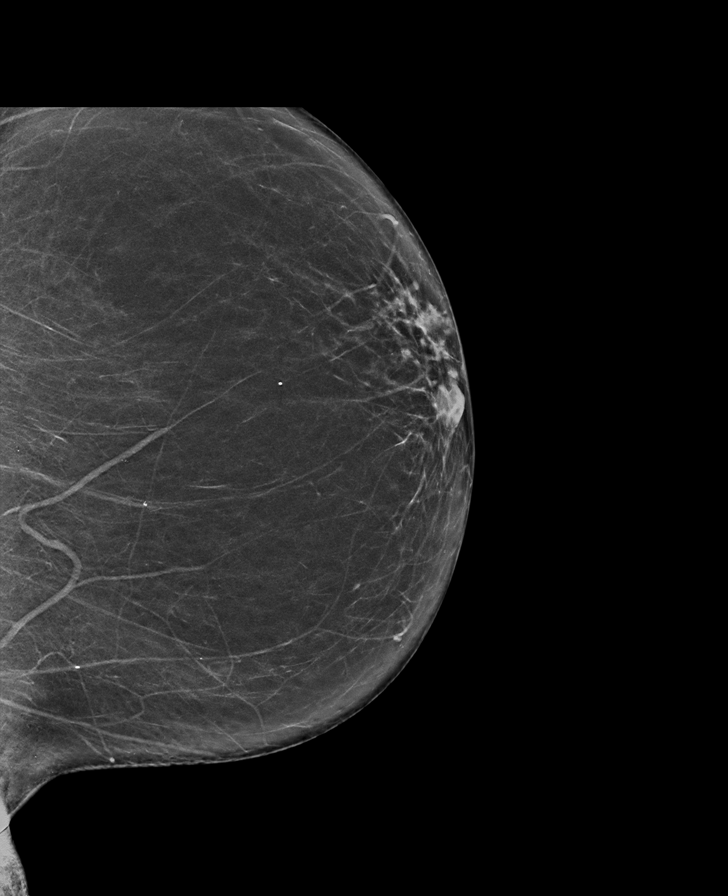

[L MLO synth-2D]
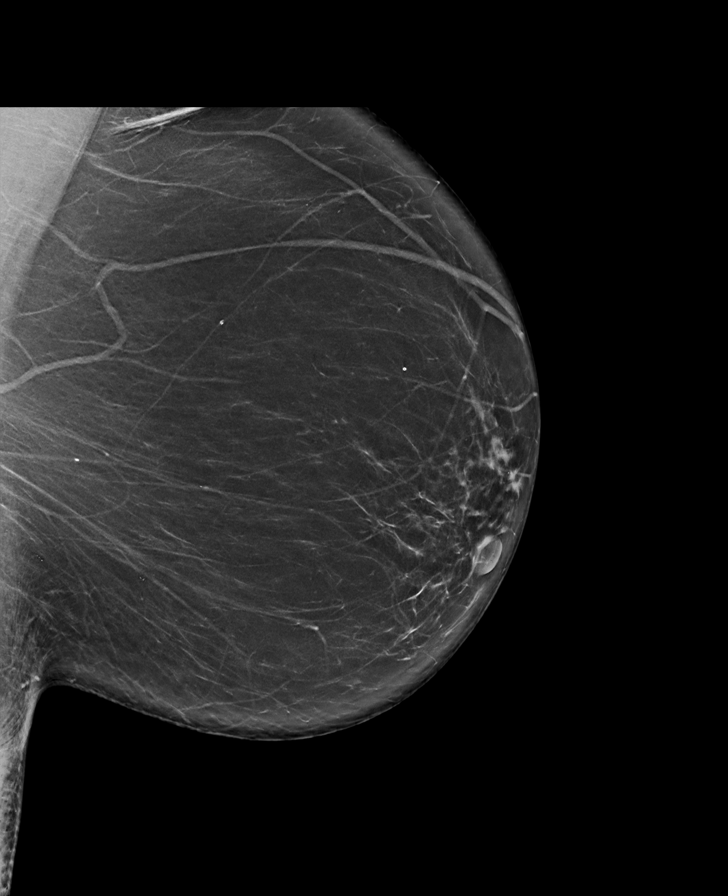

[R CC synth-2D]
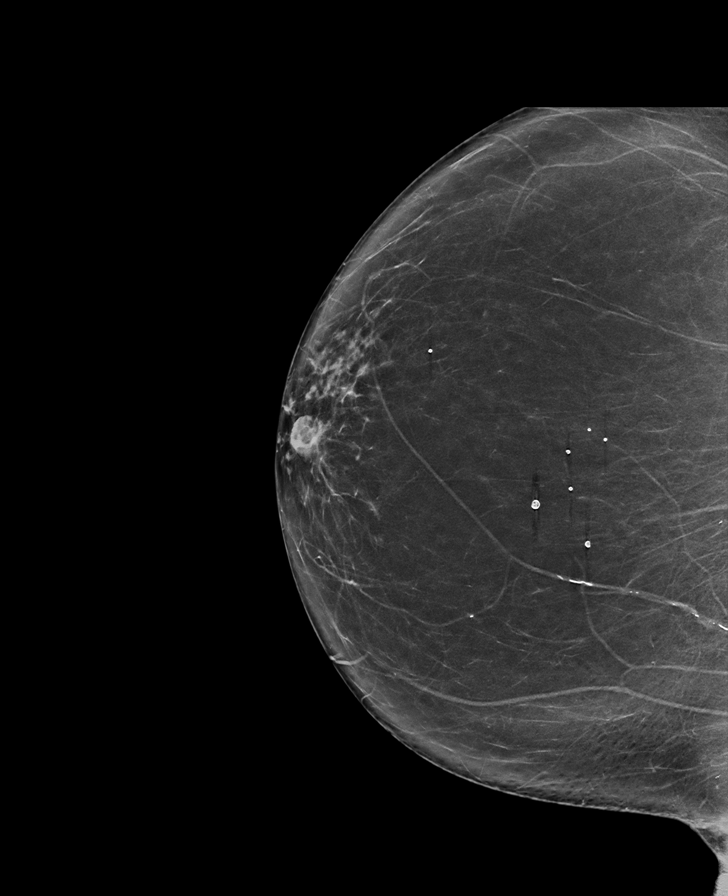

[L CC tomo · tomo slice 37/72.0]
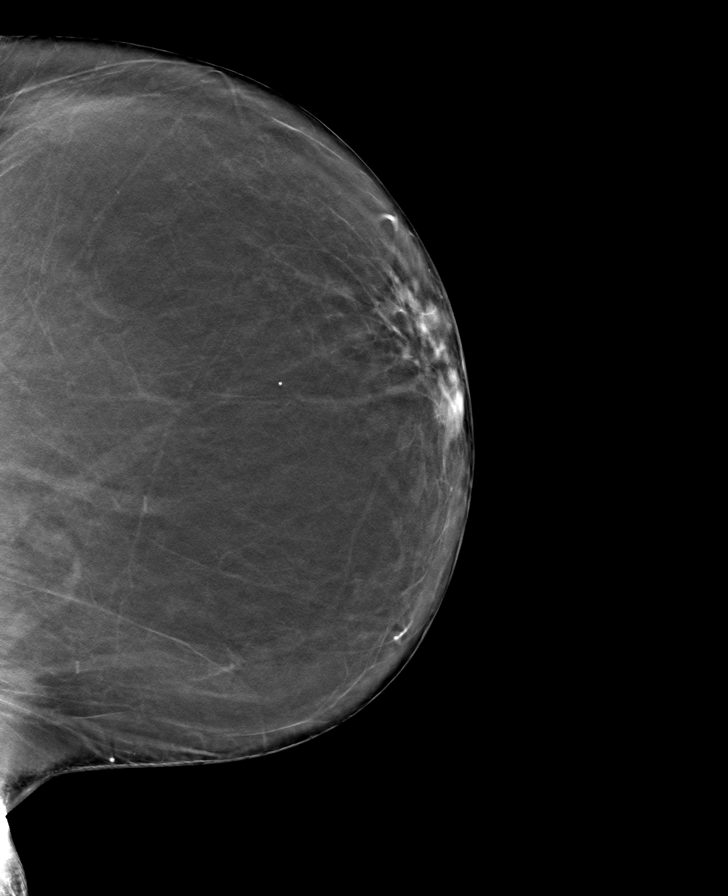

[R CC tomo · tomo slice 39/76.0]
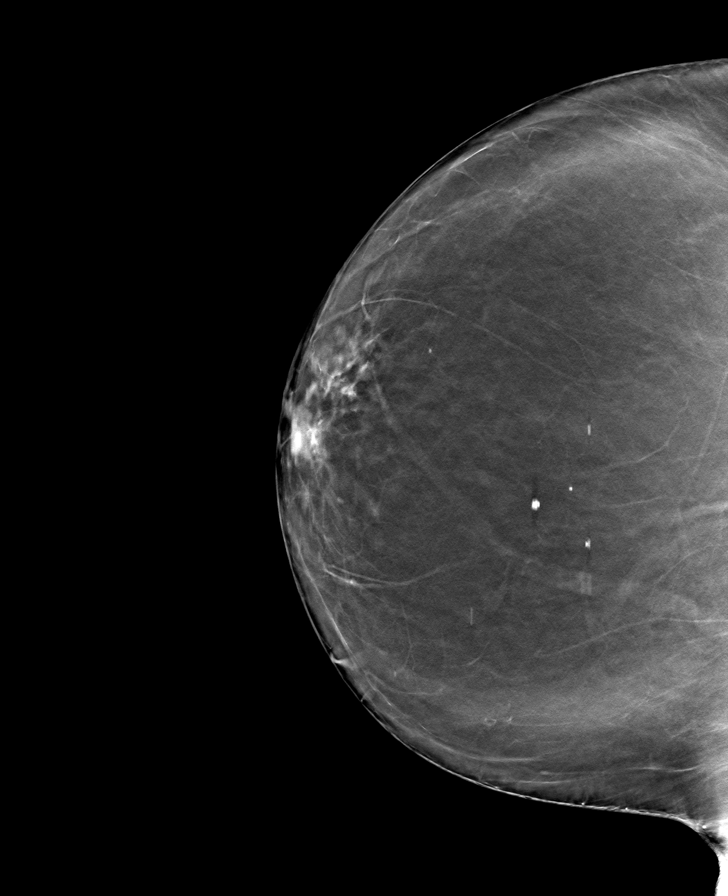

[R MLO tomo · tomo slice 43/85.0]
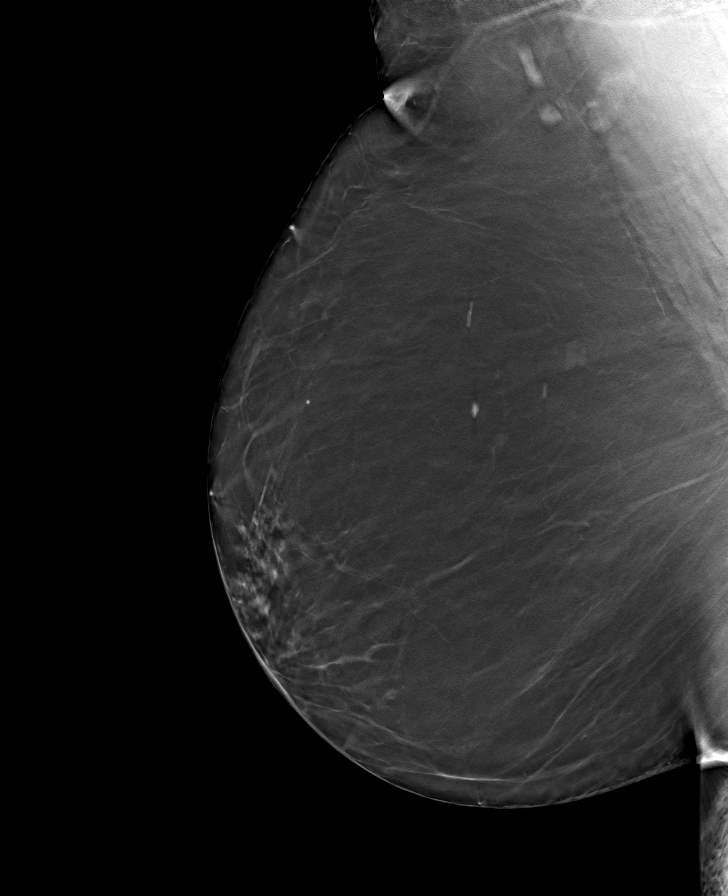

[L MLO tomo · tomo slice 43/86.0]
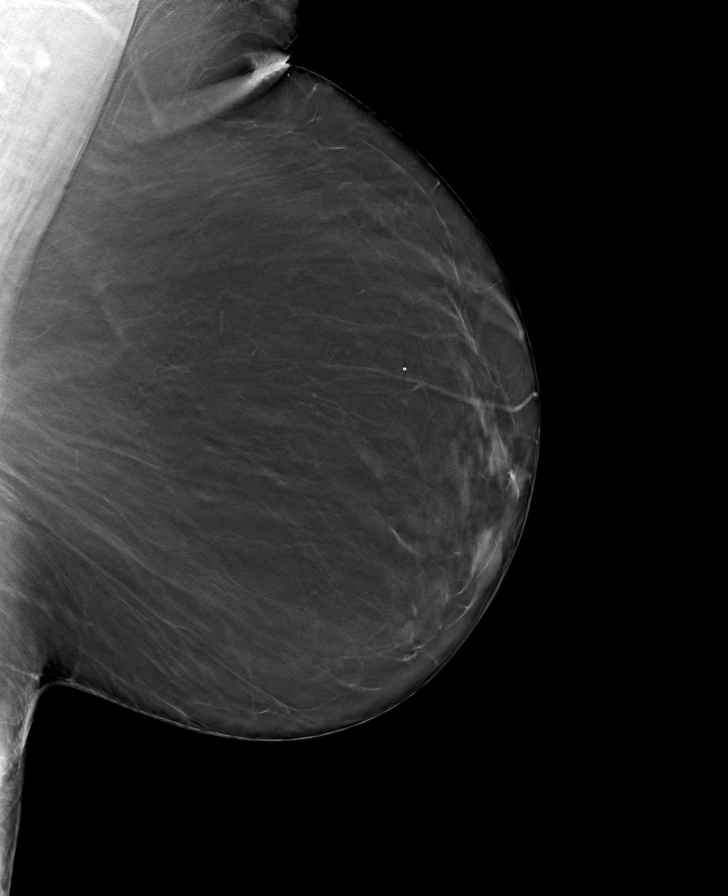

[8 of 24 positions shown; findings below may reference images not displayed]

FINDINGS: There are no findings suspicious for malignancy. Images were
processed with CAD.
IMPRESSION: No mammographic evidence of malignancy. A result letter of this
screening mammogram will be mailed directly to the patient.

RECOMMENDATION:
Screening mammogram in one year. (Code:[TA])

BI-RADS CATEGORY  1: Negative.

## 2019-12-11 MED FILL — ESBRIET 267 MG TABS: 267 | 30 days supply | Qty: 270 | Fill #4

## 2019-12-20 ENCOUNTER — Ambulatory Visit: Payer: Medicare HMO | Admitting: Adult Health

## 2019-12-31 ENCOUNTER — Ambulatory Visit (INDEPENDENT_AMBULATORY_CARE_PROVIDER_SITE_OTHER): Payer: Medicare HMO | Admitting: Pulmonary Disease

## 2019-12-31 ENCOUNTER — Other Ambulatory Visit: Payer: Self-pay

## 2019-12-31 ENCOUNTER — Encounter: Payer: Self-pay | Admitting: Pulmonary Disease

## 2019-12-31 VITALS — BP 130/78 | HR 60 | Temp 97.8°F | Ht 63.0 in | Wt 189.2 lb

## 2019-12-31 DIAGNOSIS — J849 Interstitial pulmonary disease, unspecified: Secondary | ICD-10-CM

## 2019-12-31 DIAGNOSIS — J439 Emphysema, unspecified: Secondary | ICD-10-CM

## 2019-12-31 NOTE — Assessment & Plan Note (Signed)
Plan: Continue Symbicort 160 Continue Spiriva Respimat 1.25 Patient needs spirometry with DLCO, ordered today Encourage patient to reconsider stance on pulmonary rehab, and when she is ready we are more than happy to refer her there

## 2019-12-31 NOTE — Progress Notes (Signed)
_0  ID: Erica [name], female    DOB: December 05, 1955, 64 y.o.   MRN: [id number]  Chief Complaint  Patient presents with  . Follow-up    ILD, COPD    Referring provider: Nolene Ebbs, MD  HPI:  64 year old female former smoker followed in our office for COPD and interstitial lung disease  Past medical history: Rectal cancer, GERD Smoking history: Former smoker, quit 2019, 10-pack-year smoking history Maintenance: Symbicort 160, Spiriva Respimat 1.25, Esbriet Patient of Dr. Chase Caller  12/31/2019  - Visit   64 year old female former smoker followed in our office for COPD and interstitial lung disease.  Patient last completed follow-up with our office in April/2021 that was a virtual visit at that period of time is recommended that she seek emergent evaluation due to her atypical chest pain.  She was also encouraged to seek follow-up with primary care due to worsened anxiety.  She was encouraged to remain on Symbicort 160, Spiriva Respimat as well as continue with pulmonary rehab and continue her Esbriet.  She is followed in our office by Dr. Chase Caller.  Patient presenting to office today as a follow-up.  She reports that she is still having her baseline dyspnea.  She reports adherence to Symbicort 160 and Spiriva Respimat 1.25.  She continues to take Esbriet.  Last lab work from 2 months ago shows stable LFTs.  Patient reports that she feels tired.  She is unsure if this is related to Esbriet.  She never went back to pulmonary rehab.  We will discuss this today.  She continues to have aspects of atypical chest pain.  She is followed by cardiology for this.  This is a chronic issue.  Patient is due for pulmonary function testing that was ordered in March/2021.  We will discuss this today.  Walk today in office stable, patient able to complete 3 laps without any oxygen desaturations.  Questionaires / Pulmonary Flowsheets:   ACT:  No flowsheet data found.  MMRC: mMRC Dyspnea Scale  mMRC Score  12/31/2019 2    Epworth:  Results of the Epworth flowsheet 06/21/2016  Sitting and reading 2  Watching TV 2  Sitting, inactive in a public place (e.g. a theatre or a meeting) 2  As a passenger in a car for an hour without a break 0  Lying down to rest in the afternoon when circumstances permit 2  Sitting and talking to someone 0  Sitting quietly after a lunch without alcohol 2  In a car, while stopped for a few minutes in traffic 0  Total score 10    Tests:   04/12/2019-high-resolution CT chest-appearance of the lungs remains compatible with interstitial lung disease consider diagnostic for UIP, minimal progression compared to prior study, aortic arthrosclerosis  08/11/2016-pulmonary function test-FVC 2.56 (98% predicted), postbronchodilator ratio 67, postbronchodilator FEV1 2.01 (98% predicted), positive bronchodilator response, DLCO 14.81 (61% predicted)   FENO:  No results found for: NITRICOXIDE  PFT: PFT Results Latest Ref Rng & Units 06/13/2019 10/24/2017 08/11/2016  FVC-Pre L 2.52 2.79 2.56  FVC-Predicted Pre % 98 108 98  FVC-Post L 2.63 - 2.99  FVC-Predicted Post % 102 - 114  Pre FEV1/FVC % % 81 69 67  Post FEV1/FCV % % 81 - 67  FEV1-Pre L 2.05 1.93 1.72  FEV1-Predicted Pre % 102 95 84  FEV1-Post L 2.13 - 2.01  DLCO UNC% % 38 - 61  DLCO COR %Predicted % 47 - 66  TLC L 4.48 - 5.31  TLC % Predicted %  88 - 105  RV % Predicted % 75 - 136    WALK:  SIX MIN WALK 12/31/2019 08/15/2019 06/18/2019 04/08/2019 10/24/2017  Supplimental Oxygen during Test? (L/min) _0   Tech Comments: Average pace with no desats Pt walked at an average pace completing all required laps having complaints of moderate SOB. moderate pace walk with no symptoms pt walked a moderate pace, tolerated walk well. Fast paced walk  no sob noted.     Imaging: MM 3D SCREEN BREAST BILATERAL  Result Date: 12/10/2019 CLINICAL DATA:  Screening. EXAM: DIGITAL SCREENING BILATERAL MAMMOGRAM WITH  TOMO AND CAD COMPARISON:  Previous exam(s). ACR Breast Density Category a: The breast tissue is almost entirely fatty. FINDINGS: There are no findings suspicious for malignancy. Images were processed with CAD. IMPRESSION: No mammographic evidence of malignancy. A result letter of this screening mammogram will be mailed directly to the patient. RECOMMENDATION: Screening mammogram in one year. (Code:SM-B-01Y) BI-RADS CATEGORY  1: Negative. Electronically Signed   By: Everlean Alstrom M.D.   On: 12/10/2019 10:18    Lab Results:  CBC    Component Value Date/Time   WBC 6.6 05/09/2019 0133   RBC 3.54 (L) 05/09/2019 0133   HGB 11.5 (L) 05/09/2019 0133   HGB 11.0 (L) 08/29/2018 0915   HGB 12.5 03/02/2017 1428   HCT 32.7 (L) 05/09/2019 0133   HCT 35.4 03/02/2017 1428   PLT 181 05/09/2019 0133   PLT 171 08/29/2018 0915   PLT 192 03/02/2017 1428   MCV 92.4 05/09/2019 0133   MCV 94.3 03/02/2017 1428   MCH 32.5 05/09/2019 0133   MCHC 35.2 05/09/2019 0133   RDW 15.5 05/09/2019 0133   RDW 14.6 (H) 03/02/2017 1428   LYMPHSABS 0.9 08/29/2018 0915   LYMPHSABS 1.1 03/02/2017 1428   MONOABS 0.3 08/29/2018 0915   MONOABS 0.4 03/02/2017 1428   EOSABS 0.1 08/29/2018 0915   EOSABS 0.1 03/02/2017 1428   BASOSABS 0.0 08/29/2018 0915   BASOSABS 0.1 03/02/2017 1428    BMET    Component Value Date/Time   NA 140 10/30/2019 1657   NA 131 (L) 03/02/2017 1428   K 4.2 10/30/2019 1657   K 4.8 03/02/2017 1428   CL 107 (H) 10/30/2019 1657   CO2 21 10/30/2019 1657   CO2 24 03/02/2017 1428   GLUCOSE 78 10/30/2019 1657   GLUCOSE 502 (HH) 05/11/2019 2220   GLUCOSE 482 (H) 03/02/2017 1428   BUN 19 10/30/2019 1657   BUN 20.0 03/02/2017 1428   CREATININE 1.31 (H) 10/30/2019 1657   CREATININE 1.38 (H) 08/29/2018 0915   CREATININE 1.7 (H) 03/02/2017 1428   CALCIUM 9.1 10/30/2019 1657   CALCIUM 9.2 03/02/2017 1428   GFRNONAA 43 (L) 10/30/2019 1657   GFRNONAA 40 (L) 08/29/2018 0915   GFRAA 49 (L) 10/30/2019  1657   GFRAA 47 (L) 08/29/2018 0915    BNP No results found for: BNP  ProBNP No results found for: PROBNP  Specialty Problems      Pulmonary Problems   COPD (chronic obstructive pulmonary disease) ([place])    PFT (08/2016)  FEV1 1. 72/84%.  DLCO 61%      Cough   Interstitial lung disease (HCC)      Allergies  Allergen Reactions  . Penicillins Hives, Itching and Swelling    Tongue swells up Has patient had a PCN reaction causing immediate rash, facial/tongue/throat swelling, SOB or lightheadedness with hypotension: Yes Has patient had a PCN reaction causing severe rash involving mucus  membranes or skin necrosis: Yes Has patient had a PCN reaction that required hospitalization Yes Has patient had a PCN reaction occurring within the last 10 years: No If all of the above answers are "NO", then may proceed with Cephalosporin use.     Immunization History  Administered Date(s) Administered  . Influenza Split 03/13/2017  . Influenza Whole 04/26/2018  . Influenza,inj,Quad PF,6+ Mos 03/09/2019  . Influenza-Unspecified 03/13/2016  . PFIZER SARS-COV-2 Vaccination 08/19/2019, 09/18/2019    Past Medical History:  Diagnosis Date  . Arthritis    especially in shoulders  . Asthma   . Depression   . Diabetes mellitus   . GERD (gastroesophageal reflux disease)   . Headache(784.0)    "mild"  . Hypertension   . Mental disorder    depression  . Neuropathy    feet   . Pain    arthritis pain - takes tramadol as needed  . Rectal polyp    very little bleeding with bowel movements- no pain    Tobacco History: Social History   Tobacco Use  Smoking Status Former Smoker  . Packs/day: 0.50  . Years: 20.00  . Pack years: 10.00  . Types: Cigarettes  . Quit date: 01/05/2018  . Years since quitting: 1.9  Smokeless Tobacco Never Used  Tobacco Comment   Unknown    Counseling given: Yes Comment: Unknown    Continue to not smoke  Outpatient Encounter Medications as of  12/31/2019  Medication Sig  . albuterol (PROVENTIL) (2.5 MG/3ML) 0.083% nebulizer solution Take 3 mLs (2.5 mg total) by nebulization every 6 (six) hours as needed for wheezing or shortness of breath.  Marland Kitchen albuterol (VENTOLIN HFA) 108 (90 Base) MCG/ACT inhaler Inhale 2 puffs into the lungs every 4 (four) hours as needed for wheezing.  Marland Kitchen amLODipine (NORVASC) 10 MG tablet Take 10 mg by mouth daily.   Marland Kitchen aspirin EC 81 MG tablet Take 81 mg by mouth daily.  . fenofibrate (TRICOR) 145 MG tablet Take 1 tablet (145 mg total) by mouth daily.  Marland Kitchen gabapentin (NEURONTIN) 300 MG capsule Take 300 mg by mouth 3 (three) times daily.   . hydrochlorothiazide (MICROZIDE) 12.5 MG capsule Take 1 capsule (12.5 mg total) by mouth daily.  Marland Kitchen losartan (COZAAR) 50 MG tablet Take 1 tablet (50 mg total) by mouth daily.  . metoprolol succinate (TOPROL-XL) 50 MG 24 hr tablet TAKE 1 TABLET(50 MG) BY MOUTH DAILY WITH OR IMMEDIATELY FOLLOWING A MEAL  . Multiple Vitamin (MULTIVITAMIN WITH MINERALS) TABS tablet Take 1 tablet by mouth daily.  . pantoprazole (PROTONIX) 40 MG tablet Take 40 mg by mouth daily.  . Pirfenidone (ESBRIET) 267 MG TABS Take 3 tablets (801 mg total) by mouth in the morning, at noon, and at bedtime.  . sertraline (ZOLOFT) 50 MG tablet Take 1 tablet (50 mg total) by mouth daily.  . SYMBICORT 160-4.5 MCG/ACT inhaler Inhale 2 puffs into the lungs 2 (two) times daily.  . Tiotropium Bromide Monohydrate (SPIRIVA RESPIMAT) 1.25 MCG/ACT AERS Inhale 2 puffs into the lungs daily.  Nelva Nay SOLOSTAR 300 UNIT/ML SOPN Inject 90 Units into the skin at bedtime. 50 in th A.M. AND 40 IN THE P.M.   No facility-administered encounter medications on file as of 12/31/2019.     Review of Systems  Review of Systems  Constitutional: Positive for fatigue. Negative for activity change and fever.  HENT: Positive for congestion. Negative for sinus pressure, sinus pain and sore throat.   Respiratory: Positive for cough (coughing gold  mucous) and shortness of breath. Negative for wheezing.   Cardiovascular: Negative for chest pain and palpitations.  Gastrointestinal: Negative for diarrhea, nausea and vomiting.  Musculoskeletal: Negative for arthralgias.  Neurological: Negative for dizziness.  Psychiatric/Behavioral: Negative for sleep disturbance. The patient is not nervous/anxious.      Physical Exam  BP 130/78 (BP Location: Left Arm, Cuff Size: Normal)   Pulse 60   Temp 97.8 F (36.6 C) (Oral)   Ht _0  (1.6 m)   Wt 189 lb 3.2 oz (85.8 kg)   SpO2 98%   BMI 33.52 kg/m   Wt Readings from Last 5 Encounters:  12/31/19 189 lb 3.2 oz (85.8 kg)  11/19/19 192 lb (87.1 kg)  09/20/19 192 lb 12.8 oz (87.5 kg)  08/06/19 195 lb (88.5 kg)  07/08/19 198 lb 4.8 oz (89.9 kg)    BMI Readings from Last 5 Encounters:  12/31/19 33.52 kg/m  11/19/19 34.01 kg/m  09/20/19 34.15 kg/m  08/06/19 34.54 kg/m  07/08/19 35.13 kg/m     Physical Exam Vitals and nursing note reviewed.  Constitutional:      General: She is not in acute distress.    Appearance: Normal appearance.  HENT:     Head: Normocephalic and atraumatic.     Right Ear: External ear normal.     Left Ear: External ear normal.     Mouth/Throat:     Mouth: Mucous membranes are moist.     Pharynx: Oropharynx is clear.  Eyes:     Pupils: Pupils are equal, round, and reactive to light.  Cardiovascular:     Rate and Rhythm: Normal rate and regular rhythm.     Pulses: Normal pulses.     Heart sounds: Normal heart sounds. No murmur heard.   Pulmonary:     Effort: Pulmonary effort is normal. No respiratory distress.     Breath sounds: No decreased air movement. Rales (Bibasilar) present. No decreased breath sounds or wheezing.  Musculoskeletal:     Cervical back: Normal range of motion.  Skin:    General: Skin is warm and dry.     Capillary Refill: Capillary refill takes less than 2 seconds.  Neurological:     General: No focal deficit present.      Mental Status: She is alert and oriented to person, place, and time. Mental status is at baseline.     Gait: Gait normal.  Psychiatric:        Mood and Affect: Mood normal.        Behavior: Behavior normal.        Thought Content: Thought content normal.        Judgment: Judgment normal.       Assessment & Plan:   COPD (chronic obstructive pulmonary disease) (HCC) Plan: Continue Symbicort 160 Continue Spiriva Respimat 1.25 Patient needs spirometry with DLCO, ordered today Encourage patient to reconsider stance on pulmonary rehab, and when she is ready we are more than happy to refer her there  Interstitial lung disease ([place]) Plan: Continue Esbriet Patient needs spirometry with DLCO Follow-up with Dr. Chase Caller in [facility] clinic     Return in about 2 months (around 03/02/2020), or if symptoms worsen or fail to improve, for Follow up with Dr. Purnell Shoemaker, [facility] clinic - 41mn slot, Follow up for PFT - 393m, Spiro with DLCO.   BrLauraine RinneNP 12/31/2019   This appointment required 31 minutes of patient care (this includes precharting, chart review, review of results, face-to-face care, etc.).

## 2019-12-31 NOTE — Patient Instructions (Addendum)
You were seen today by Lauraine Rinne, NP  for:   1. Interstitial lung disease ([place])  - Pulmonary function test; Future  Walk today in office  Continue Esbriet at this time  We will repeat a breathing test to ensure that you are not having progression  If you become interested in going back to pulmonary rehab please notify our office and we can rerefer you back  2. Pulmonary emphysema, unspecified emphysema type (HCC)  Continue Symbicort 160 >>> 2 puffs in the morning right when you wake up, rinse out your mouth after use, 12 hours later 2 puffs, rinse after use >>> Take this daily, no matter what >>> This is not a rescue inhaler   Spiriva Respimat 1.25 >>> 2 puffs daily >>> Do this every day >>>This is not a rescue inhaler  Note your daily symptoms > remember "red flags" for COPD:   >>>Increase in cough >>>increase in sputum production >>>increase in shortness of breath or activity  intolerance.   If you notice these symptoms, please call the office to be seen.     We recommend today:  Orders Placed This Encounter  Procedures  . Pulmonary function test    Arlyce Harman with dlco    Standing Status:   Future    Standing Expiration Date:   12/30/2020    Order Specific Question:   Where should this test be performed?    Answer:   [facility]   Orders Placed This Encounter  Procedures  . Pulmonary function test   No orders of the defined types were placed in this encounter.   Follow Up:    Return in about 2 months (around 03/02/2020), or if symptoms worsen or fail to improve, for Follow up with Dr. Purnell Shoemaker, [facility] clinic - 29mn slot, Follow up for PFT - 330m, Spiro with DLCO.     Please do your part to reduce the spread of COVID-19:      Reduce your risk of any infection  and COVID19 by using the similar precautions used for avoiding the common cold or flu:  . Marland Kitchenash your hands often with soap and warm water for at least 20 seconds.  If soap and water are not  readily available, use an alcohol-based hand sanitizer with at least 60% alcohol.  . If coughing or sneezing, cover your mouth and nose by coughing or sneezing into the elbow areas of your shirt or coat, into a tissue or into your sleeve (not your hands). . Langley Gauss MASK when in public  . Avoid shaking hands with others and consider head nods or verbal greetings only. . Avoid touching your eyes, nose, or mouth with unwashed hands.  . Avoid close contact with people who are sick. . Avoid places or events with large numbers of people in one location, like concerts or sporting events. . If you have some symptoms but not all symptoms, continue to monitor at home and seek medical attention if your symptoms worsen. . If you are having a medical emergency, call 911.   ADVassar e-Visit: [email or web address]       [facility]: [phone] [id number] [phone]                 [facility]: [phone]   It is flu season:   >>> Best ways to protect herself from the flu: Receive the yearly  flu vaccine, practice good hand hygiene washing with soap and also using hand sanitizer when available, eat a nutritious meals, get adequate rest, hydrate appropriately   Please contact the office if your symptoms worsen or you have concerns that you are not improving.   Thank you for choosing [facility] for your healthcare, and for allowing [place] to partner with you on your healthcare journey. I am thankful to be able to provide care to you today.   Wyn Quaker FNP-C

## 2019-12-31 NOTE — Assessment & Plan Note (Signed)
Plan: Continue Esbriet Patient needs spirometry with DLCO Follow-up with Dr. Chase Caller in [facility] clinic

## 2020-01-14 MED FILL — ESBRIET 267 MG TABS: 267 | 30 days supply | Qty: 270 | Fill #5

## 2020-01-28 ENCOUNTER — Telehealth: Payer: Self-pay | Admitting: Internal Medicine

## 2020-01-28 ENCOUNTER — Other Ambulatory Visit (INDEPENDENT_AMBULATORY_CARE_PROVIDER_SITE_OTHER): Payer: Medicare HMO

## 2020-01-28 DIAGNOSIS — Z5181 Encounter for therapeutic drug level monitoring: Secondary | ICD-10-CM | POA: Diagnosis not present

## 2020-01-28 DIAGNOSIS — J849 Interstitial pulmonary disease, unspecified: Secondary | ICD-10-CM

## 2020-01-28 LAB — HEPATIC FUNCTION PANEL
ALT: 13 U/L (ref 0–35)
AST: 21 U/L (ref 0–37)
Albumin: 3.7 g/dL (ref 3.5–5.2)
Alkaline Phosphatase: 103 U/L (ref 39–117)
Bilirubin, Direct: 0.1 mg/dL (ref 0.0–0.3)
Total Bilirubin: 0.5 mg/dL (ref 0.2–1.2)
Total Protein: 8.1 g/dL (ref 6.0–8.3)

## 2020-01-28 NOTE — Progress Notes (Signed)
LFT normal

## 2020-01-28 NOTE — Telephone Encounter (Signed)
Spoke with MR and he said it was okay to order LFT. Lab has been ordered. Pt  Has been made aware. Stated that we will contact her with results.  Nothing further needed.

## 2020-01-29 NOTE — Progress Notes (Signed)
Called and left detailed message on voicemail per DPR on LFT result per Dr Chase Caller. Advised patient to please feel free to call back if she had any questions. Nothing further needed at this time.

## 2020-02-13 ENCOUNTER — Other Ambulatory Visit: Payer: Self-pay | Admitting: Cardiology

## 2020-02-13 DIAGNOSIS — E781 Pure hyperglyceridemia: Secondary | ICD-10-CM

## 2020-02-13 MED FILL — ESBRIET 267 MG TABS: 267 | 30 days supply | Qty: 270 | Fill #6

## 2020-02-19 ENCOUNTER — Ambulatory Visit: Payer: Medicare HMO | Admitting: Cardiology

## 2020-02-21 ENCOUNTER — Telehealth: Payer: Self-pay | Admitting: Internal Medicine

## 2020-02-21 NOTE — Telephone Encounter (Signed)
Pt returning a phone call. Pt can be reached at [phone].

## 2020-02-21 NOTE — Telephone Encounter (Signed)
Tried calling the pt and there was no answer- LMTCB and will reroute to triage as urgent and attempt to call again before days end

## 2020-02-21 NOTE — Telephone Encounter (Signed)
Left a voicemail on patient's phone to call our office back regarding prior message.

## 2020-02-22 NOTE — Telephone Encounter (Signed)
Tried calling again and had to [facility]- will attempt once again 9/13 due to nature of call

## 2020-02-27 NOTE — Telephone Encounter (Signed)
lmtcb for pt.  

## 2020-03-03 ENCOUNTER — Encounter: Payer: Self-pay | Admitting: Emergency Medicine

## 2020-03-03 NOTE — Telephone Encounter (Signed)
Letter has been printed and placed in the mail for pt. Nothing further needed.

## 2020-03-03 NOTE — Telephone Encounter (Signed)
lmtcb for pt.   Will send letter to pt as we have tried several times to reach pt with no success.   Triage, please print and mail letter. Letter has already been generated. Please close message once complete. Thanks.

## 2020-03-11 ENCOUNTER — Ambulatory Visit: Payer: Medicare HMO | Admitting: Cardiology

## 2020-03-13 ENCOUNTER — Ambulatory Visit: Payer: Medicare HMO | Admitting: Cardiology

## 2020-03-13 NOTE — Progress Notes (Deleted)
Erica [name] Date of Birth: 12/04/1955 MRN: [id number] Primary Care Provider:Avbuere, Christean Grief, MD Former Cardiology Providers: Dr. Adrian Prows, Jeri Lager, APRN, FNP-C Primary Cardiologist: Rex Kras, DO, [facility] (established care 11/19/2019)  Date: 03/13/20 Last Office Visit: 11/19/2019  No chief complaint on file.   HPI   Erica [name] is a 64 y.o.  female who presents to the office with a chief complaint of " ***." Patient's past medical history and cardiovascular risk factors include: rectal CA in 2014 that is in remission, T2DM, former tobacco use, emphysema, interstitial lung disease, idiopathic pulmonary fibrosis, and coronary artery calcification on CT scan.   Given the coronary calcification patient had undergone subsequently an echocardiogram and stress test. Results are noted below for further reference. Since then shared decision was to improve her modifiable cardiovascular risk factors.   Hypertension: Patient's blood pressure at today's office visit is***. Medications reconciled. Patient states that her blood pressure is currently being managed by PCP. Last office visit she was encouraged to invest in a blood pressure cuff so that she can monitor it on a regular basis. Patient states that her home blood pressures range between***.  Hyperlipidemia: Patient was on Crestor in the past but was discontinued secondary to myalgias. *** Last lipid profile from May 2021 LDL less than 70 mg/dL but her triglyceride levels were elevated at 169 g/dL. She does not want to start on pharmacological therapy lifestyle modifications and the shared decision was to reevaluate. ***Note she is in hospice   In regard to hyperlipidemia patient was on Crestor initially.  But she developed myalgias and was transitioned to Lipitor 20 mg p.o. nightly. However, patient states that she currently is not taking statin medications as Lipitor was not approved by her insurance company. I do not have her most recent  medication list or medication bottles to verify this information. Patient is asked to call the office back to reconcile her meds more accurately. Her lipid profile has improved and her LDL level is less than 70 mg/dL.  However, patient triglyceride levels are still not well controlled.  Since last office visit patient has not had any chest pain at rest or with effort related activities. Patient states that her shortness of breath is relatively stable. She does have good days and bad days. But her intensity, frequency and the duration of effort related dyspnea has not worsened. She has other chronic pulmonary conditions that she is currently working with Dr. Chase Caller. She has an upcoming appointment to see him within the next 1 week.  Independently reviewed the most recent blood work from 10/30/2019.  ALLERGIES: Allergies  Allergen Reactions  . Penicillins Hives, Itching and Swelling    Tongue swells up Has patient had a PCN reaction causing immediate rash, facial/tongue/throat swelling, SOB or lightheadedness with hypotension: Yes Has patient had a PCN reaction causing severe rash involving mucus membranes or skin necrosis: Yes Has patient had a PCN reaction that required hospitalization Yes Has patient had a PCN reaction occurring within the last 10 years: No If all of the above answers are "NO", then may proceed with Cephalosporin use.      MEDICATION LIST PRIOR TO VISIT: Current Outpatient Medications on File Prior to Visit  Medication Sig Dispense Refill  . albuterol (PROVENTIL) (2.5 MG/3ML) 0.083% nebulizer solution Take 3 mLs (2.5 mg total) by nebulization every 6 (six) hours as needed for wheezing or shortness of breath. 75 mL 12  . albuterol (VENTOLIN HFA) 108 (90 Base) MCG/ACT inhaler  Inhale 2 puffs into the lungs every 4 (four) hours as needed for wheezing. 1 g 5  . amLODipine (NORVASC) 10 MG tablet Take 10 mg by mouth daily.   2  . aspirin EC 81 MG tablet Take 81 mg by mouth  daily.    . fenofibrate (TRICOR) 145 MG tablet TAKE 1 TABLET(145 MG) BY MOUTH DAILY 90 tablet 1  . gabapentin (NEURONTIN) 300 MG capsule Take 300 mg by mouth 3 (three) times daily.     . hydrochlorothiazide (MICROZIDE) 12.5 MG capsule Take 1 capsule (12.5 mg total) by mouth daily. 30 capsule 0  . losartan (COZAAR) 50 MG tablet Take 1 tablet (50 mg total) by mouth daily. 30 tablet 0  . metoprolol succinate (TOPROL-XL) 50 MG 24 hr tablet TAKE 1 TABLET(50 MG) BY MOUTH DAILY WITH OR IMMEDIATELY FOLLOWING A MEAL 90 tablet 3  . Multiple Vitamin (MULTIVITAMIN WITH MINERALS) TABS tablet Take 1 tablet by mouth daily.    . pantoprazole (PROTONIX) 40 MG tablet Take 40 mg by mouth daily.    . Pirfenidone (ESBRIET) 267 MG TABS Take 3 tablets (801 mg total) by mouth in the morning, at noon, and at bedtime. 270 tablet 11  . sertraline (ZOLOFT) 50 MG tablet Take 1 tablet (50 mg total) by mouth daily. 30 tablet 0  . SYMBICORT 160-4.5 MCG/ACT inhaler Inhale 2 puffs into the lungs 2 (two) times daily. 1 Inhaler 5  . Tiotropium Bromide Monohydrate (SPIRIVA RESPIMAT) 1.25 MCG/ACT AERS Inhale 2 puffs into the lungs daily. 24 g 5  . TOUJEO SOLOSTAR 300 UNIT/ML SOPN Inject 90 Units into the skin at bedtime. 50 in th A.M. AND 40 IN THE P.M.  1   No current facility-administered medications on file prior to visit.    PAST MEDICAL HISTORY: Past Medical History:  Diagnosis Date  . Arthritis    especially in shoulders  . Asthma   . Depression   . Diabetes mellitus   . GERD (gastroesophageal reflux disease)   . Headache(784.0)    "mild"  . Hypertension   . Mental disorder    depression  . Neuropathy    feet   . Pain    arthritis pain - takes tramadol as needed  . Rectal polyp    very little bleeding with bowel movements- no pain    PAST SURGICAL HISTORY: Past Surgical History:  Procedure Laterality Date  . ANAL RECTAL MANOMETRY N/A 07/13/2016   Procedure: ANO RECTAL MANOMETRY;  Surgeon: Leighton Ruff,  MD;  Location: [facility];  Service: Endoscopy;  Laterality: N/A;  . CESAREAN SECTION    . EUS N/A 11/21/2012   Procedure: LOWER ENDOSCOPIC ULTRASOUND (EUS);  Surgeon: Arta Silence, MD;  Location: [facility];  Service: Endoscopy;  Laterality: N/A;  . FLEXIBLE SIGMOIDOSCOPY N/A 11/21/2012   Procedure: FLEXIBLE SIGMOIDOSCOPY;  Surgeon: Arta Silence, MD;  Location: [facility];  Service: Endoscopy;  Laterality: N/A;  . FLEXIBLE SIGMOIDOSCOPY N/A 01/28/2013   Procedure: FLEXIBLE SIGMOIDOSCOPY;  Surgeon: Leighton Ruff, MD;  Location: [facility];  Service: Endoscopy;  Laterality: N/A;  . LAPAROSCOPIC LOW ANTERIOR RESECTION N/A 01/29/2013   Procedure: LAPAROSCOPIC LOW ANTERIOR RESECTION, Rigid Proctoscopy;  Surgeon: Leighton Ruff, MD;  Location: [facility];  Service: General;  Laterality: N/A;  . LAPAROSCOPIC SIGMOID COLECTOMY N/A 11/14/2012   Procedure: DIAGNOSTIC LAPAROSCOPY AND SIGMOIDMOIDOSCOPY ;  Surgeon: Rolm Bookbinder, MD;  Location: [facility];  Service: General;  Laterality: N/A;  . RECTAL ULTRASOUND N/A 07/13/2016   Procedure: RECTAL ULTRASOUND;  Surgeon:  Leighton Ruff, MD;  Location: [facility];  Service: Endoscopy;  Laterality: N/A;  . TONSILLECTOMY    . TONSILLECTOMY AND ADENOIDECTOMY      FAMILY HISTORY: The patient's family history includes Cancer in her maternal aunt, maternal uncle, maternal uncle, maternal uncle, paternal aunt, and paternal uncle; Cancer (age of onset: 44) in an other family member; Diabetes in her father; Heart disease in her mother; Hyperlipidemia in her mother; Hypertension in her brother, brother, father, mother, sister, and sister; Stroke in her father.   SOCIAL HISTORY:  The patient  reports that she quit smoking about 2 years ago. Her smoking use included cigarettes. She has a 10.00 pack-year smoking history. She has never used smokeless tobacco. She reports previous alcohol use. She reports current drug use. Drug: "Crack" cocaine.  Review of Systems    Constitutional: Negative for chills and fever.  HENT: Negative for hoarse voice and nosebleeds.   Eyes: Negative for discharge, double vision and pain.  Cardiovascular: Negative for chest pain, claudication, leg swelling, near-syncope, orthopnea, palpitations, paroxysmal nocturnal dyspnea and syncope. Dyspnea on exertion: chronic and stable.  Respiratory: Positive for shortness of breath. Negative for hemoptysis.   Musculoskeletal: Negative for muscle cramps and myalgias.  Gastrointestinal: Negative for abdominal pain, constipation, diarrhea, hematemesis, hematochezia, melena, nausea and vomiting.  Neurological: Positive for light-headedness (chronic and stable). Negative for dizziness.    PHYSICAL EXAM: Vitals with BMI 12/31/2019 11/19/2019 09/20/2019  Height _0  _1  _2   Weight 189 lbs 3 oz 192 lbs 192 lbs 13 oz  BMI 33.52 09.62 83.66  Systolic 294 765 465  Diastolic 78 80 90  Pulse 60 73 92    CONSTITUTIONAL: Well-developed and well-nourished. No acute distress.  SKIN: Skin is warm and dry. No rash noted. No cyanosis. No pallor. No jaundice HEAD: Normocephalic and atraumatic.  EYES: No scleral icterus MOUTH/THROAT: Moist oral membranes.  NECK: No JVD present. No thyromegaly noted. No carotid bruits  LYMPHATIC: No visible cervical adenopathy.  CHEST Normal respiratory effort. No intercostal retractions  LUNGS: Clear to auscultation bilaterally.  No stridor. No wheezes. No rales.  CARDIOVASCULAR: Regular rate and rhythm, positive S1-S2, no murmurs rubs or gallops appreciated. ABDOMINAL: Obese, soft, nontender, nondistended, positive bowel sounds all 4 quadrants. No apparent ascites.  EXTREMITIES: No peripheral edema  HEMATOLOGIC: No significant bruising NEUROLOGIC: Oriented to person, place, and time. Nonfocal. Normal muscle tone.  PSYCHIATRIC: Normal mood and affect. Normal behavior. Cooperative  RADIOLOGY: CT of chest 04/12/2019:  1. The appearance of the lungs remains  compatible with interstitial lung disease, with a spectrum of findings considered diagnostic of usual interstitial pneumonia (UIP) per current ATS guidelines. Minimal progression compared to the prior study. 2. Aortic atherosclerosis, in addition to left anterior descending coronary artery disease.  3. Cholelithiasis. Aortic Atherosclerosis (ICD10-I70.0).  CARDIAC DATABASE: EKG: 07/08/2019: Normal sinus rhythm at 70 bpm, left atrial abnormality, left axis deviation, PRWP cannot exclude anterior infarct old.   Echocardiogram: 07/16/2019: LVEF 56%, moderate LVH, grade 1 diastolic impairment, moderately dilated left atrium, moderate AR, trace pericardial effusion.  Stress Testing:  Lexiscan tetrofosmin stress test 07/22/2019: There is  apical thinning an mild soft tissue attenuation artifact in inferior wall without ischemia or infarct. All segments of left ventricle demonstrated normal wall motion and thickening. Stress LV EF is moderately dysfunctional 39%, however visually appearted to be normal. No previous exam available for comparison. Low risk study.   Heart Catheterization: None  LABORATORY DATA: CBC Latest Ref Rng & Units 05/09/2019  08/29/2018 08/30/2017  WBC 4.0 - 10.5 K/uL 6.6 4.2 4.5  Hemoglobin 12.0 - 15.0 g/dL 11.5(L) 11.0(L) 11.0(L)  Hematocrit 36 - 46 % 32.7(L) 32.0(L) 31.0(L)  Platelets 150 - 400 K/uL 181 171 166    CMP Latest Ref Rng & Units 01/28/2020 10/30/2019 09/20/2019  Glucose 65 - 99 mg/dL - 78 -  BUN 8 - 27 mg/dL - 19 -  Creatinine 0.57 - 1.00 mg/dL - 1.31(H) -  Sodium 134 - 144 mmol/L - 140 -  Potassium 3.5 - 5.2 mmol/L - 4.2 -  Chloride 96 - 106 mmol/L - 107(H) -  CO2 20 - 29 mmol/L - 21 -  Calcium 8.7 - 10.3 mg/dL - 9.1 -  Total Protein 6.0 - 8.3 g/dL 8.1 7.6 7.7  Total Bilirubin 0.2 - 1.2 mg/dL 0.5 0.5 0.6  Alkaline Phos 39 - 117 U/L 103 100 98  AST 0 - 37 U/L _0 ALT 0 - 35 U/L _1 Lipid Panel     Component Value Date/Time   CHOL 144  10/30/2019 1657   TRIG 169 (H) 10/30/2019 1657   HDL 58 10/30/2019 1657   CHOLHDL 2.5 10/30/2019 1657   CHOLHDL 2.6 Ratio 08/21/2008 2129   VLDL 25 08/21/2008 2129   LDLCALC 58 10/30/2019 1657   LABVLDL 28 10/30/2019 1657    Lab Results  Component Value Date   HGBA1C 7.9 (H) 05/11/2019   No components found for: NTPROBNP Lab Results  Component Value Date   TSH 2.562 08/21/2008    Cardiac Panel (last 3 results) No results for input(s): CKTOTAL, CKMB, TROPONINIHS, RELINDX in the last 72 hours.  IMPRESSION:  No diagnosis found.   RECOMMENDATIONS: Erica [name] is a 64 y.o. female whose past medical history and cardiovascular risk factors include: rectal CA in 2014 that is in remission, T2DM, former tobacco use, emphysema, interstitial lung disease, idiopathic pulmonary fibrosis, and was initially evaluated by [place] for coronary artery calcification on CT scan.   Coronary artery calcification:  Patient has undergone echocardiogram and nuclear stress test to evaluate for structural heart disease and reversible ischemia.  Details noted above for further reference.  Continue aspirin.  Patient would benefit from statin therapy but was not able to tolerate Crestor.  She was given a prescription for Lipitor at the last office visit but states that she was unable to fill it due to cost and insurance coverage.  However, her lipid profile has improved and I suspect that she is on statin therapy.  However, I do not have a medication list or bottles today for review.  She is asked to call the office once she reaches home to verify her home medications.  If the patient is not on statin therapy she would benefit from a low-dose statin therapy given her coronary calcifications.  Patient denies any chest pain at rest or with effort related activities.  Patient does have effort related dyspnea which could be multifactorial from her underlying chronic pulmonary conditions and also uncontrolled blood  pressure.  She has follow-up appointment with Dr. Chase Caller in the next week.  Hyperlipidemia:  Based on the most recent lipid profile her cholesterol panel has improved compared to prior.  She will call the office back to see exactly what she is taking on a regular basis.  If she is not on statin therapy would still recommend a low-dose statin to optimize her lipid profile.  Patient does not endorse myalgias.  Most recent  lipid profile reviewed  Patient's triglyceride levels are elevated on repeat blood work.  Will prescribe fenofibrate.  Benign essential hypertension with chronic kidney disease stage III . Office blood pressure is not at goal.  . Medications were difficult to reconcile at today's visit. . Patient is asked to invest in a blood pressure cuff and to keep a log of her blood pressure readings. . Patient states that her primary care provider is currently managing her antihypertensive medications. . I have asked her to take this log at her next office visit or to call the office sooner if her systolic blood pressures at home are consistently greater than 140 mmHg so the medications can further be uptitrated. . Low salt diet recommended. A diet that is rich in fruits, vegetables, legumes, and low-fat dairy products and low in snacks, sweets, and meats (such as the Dietary Approaches to Stop Hypertension [DASH] diet).    FINAL MEDICATION LIST END OF ENCOUNTER: No orders of the defined types were placed in this encounter.    Current Outpatient Medications:  .  albuterol (PROVENTIL) (2.5 MG/3ML) 0.083% nebulizer solution, Take 3 mLs (2.5 mg total) by nebulization every 6 (six) hours as needed for wheezing or shortness of breath., Disp: 75 mL, Rfl: 12 .  albuterol (VENTOLIN HFA) 108 (90 Base) MCG/ACT inhaler, Inhale 2 puffs into the lungs every 4 (four) hours as needed for wheezing., Disp: 1 g, Rfl: 5 .  amLODipine (NORVASC) 10 MG tablet, Take 10 mg by mouth daily. , Disp: , Rfl:  2 .  aspirin EC 81 MG tablet, Take 81 mg by mouth daily., Disp: , Rfl:  .  fenofibrate (TRICOR) 145 MG tablet, TAKE 1 TABLET(145 MG) BY MOUTH DAILY, Disp: 90 tablet, Rfl: 1 .  gabapentin (NEURONTIN) 300 MG capsule, Take 300 mg by mouth 3 (three) times daily. , Disp: , Rfl:  .  hydrochlorothiazide (MICROZIDE) 12.5 MG capsule, Take 1 capsule (12.5 mg total) by mouth daily., Disp: 30 capsule, Rfl: 0 .  losartan (COZAAR) 50 MG tablet, Take 1 tablet (50 mg total) by mouth daily., Disp: 30 tablet, Rfl: 0 .  metoprolol succinate (TOPROL-XL) 50 MG 24 hr tablet, TAKE 1 TABLET(50 MG) BY MOUTH DAILY WITH OR IMMEDIATELY FOLLOWING A MEAL, Disp: 90 tablet, Rfl: 3 .  Multiple Vitamin (MULTIVITAMIN WITH MINERALS) TABS tablet, Take 1 tablet by mouth daily., Disp: , Rfl:  .  pantoprazole (PROTONIX) 40 MG tablet, Take 40 mg by mouth daily., Disp: , Rfl:  .  Pirfenidone (ESBRIET) 267 MG TABS, Take 3 tablets (801 mg total) by mouth in the morning, at noon, and at bedtime., Disp: 270 tablet, Rfl: 11 .  sertraline (ZOLOFT) 50 MG tablet, Take 1 tablet (50 mg total) by mouth daily., Disp: 30 tablet, Rfl: 0 .  SYMBICORT 160-4.5 MCG/ACT inhaler, Inhale 2 puffs into the lungs 2 (two) times daily., Disp: 1 Inhaler, Rfl: 5 .  Tiotropium Bromide Monohydrate (SPIRIVA RESPIMAT) 1.25 MCG/ACT AERS, Inhale 2 puffs into the lungs daily., Disp: 24 g, Rfl: 5 .  TOUJEO SOLOSTAR 300 UNIT/ML SOPN, Inject 90 Units into the skin at bedtime. 50 in th A.M. AND 40 IN THE P.M., Disp: , Rfl: 1  No orders of the defined types were placed in this encounter.  --Continue cardiac medications as reconciled in final medication list. --No follow-ups on file. Or sooner if needed. --Continue follow-up with your primary care physician regarding the management of your other chronic comorbid conditions.  Patient's questions and concerns were addressed  to her satisfaction. She voices understanding of the instructions provided during this encounter.   This  note was created using a voice recognition software as a result there may be grammatical errors inadvertently enclosed that do not reflect the nature of this encounter. Every attempt is made to correct such errors.  Rex Kras, [place], [facility]  Pager: [phone] Office: [phone]

## 2020-03-16 MED FILL — ESBRIET 267 MG TABS: 267 | 30 days supply | Qty: 270 | Fill #6

## 2020-03-23 ENCOUNTER — Telehealth: Payer: Self-pay | Admitting: Internal Medicine

## 2020-03-23 ENCOUNTER — Ambulatory Visit: Payer: Medicare HMO | Admitting: Cardiology

## 2020-03-23 ENCOUNTER — Encounter: Payer: Self-pay | Admitting: Cardiology

## 2020-03-23 ENCOUNTER — Other Ambulatory Visit: Payer: Self-pay

## 2020-03-23 VITALS — BP 142/85 | HR 81 | Ht 63.0 in | Wt 189.0 lb

## 2020-03-23 DIAGNOSIS — E781 Pure hyperglyceridemia: Secondary | ICD-10-CM

## 2020-03-23 DIAGNOSIS — E782 Mixed hyperlipidemia: Secondary | ICD-10-CM

## 2020-03-23 DIAGNOSIS — I129 Hypertensive chronic kidney disease with stage 1 through stage 4 chronic kidney disease, or unspecified chronic kidney disease: Secondary | ICD-10-CM

## 2020-03-23 DIAGNOSIS — N183 Chronic kidney disease, stage 3 unspecified: Secondary | ICD-10-CM

## 2020-03-23 DIAGNOSIS — E119 Type 2 diabetes mellitus without complications: Secondary | ICD-10-CM

## 2020-03-23 DIAGNOSIS — I2510 Atherosclerotic heart disease of native coronary artery without angina pectoris: Secondary | ICD-10-CM

## 2020-03-23 DIAGNOSIS — Z87891 Personal history of nicotine dependence: Secondary | ICD-10-CM

## 2020-03-23 NOTE — Progress Notes (Signed)
_0  ID: Erica [name], female    DOB: Nov 05, 1955, 64 y.o.   MRN: [id number]  No chief complaint on file.   Referring provider: Nolene Ebbs, MD  HPI: 64 year old female former smoker followed in our office for COPD and interstitial lung disease Past medical history: Rectal cancer, GERD Smoking history: Former smoker, quit 2019, 10-pack-year smoking history Maintenance: Symbicort 160, Spiriva Respimat 1.25, Esbriet Patient of Dr. Chase Caller  Previous [facility] pulmonary encounter: 12/31/2019  64 year old female former smoker followed in our office for COPD and interstitial lung disease.  Patient last completed follow-up with our office in April/2021 that was a virtual visit at that period of time is recommended that she seek emergent evaluation due to her atypical chest pain.  She was also encouraged to seek follow-up with primary care due to worsened anxiety.  She was encouraged to remain on Symbicort 160, Spiriva Respimat as well as continue with pulmonary rehab and continue her Esbriet.  She is followed in our office by Dr. Chase Caller.  Patient presenting to office today as a follow-up.  She reports that she is still having her baseline dyspnea.  She reports adherence to Symbicort 160 and Spiriva Respimat 1.25.  She continues to take Esbriet.  Last lab work from 2 months ago shows stable LFTs.  Patient reports that she feels tired.  She is unsure if this is related to Esbriet.  She never went back to pulmonary rehab.  We will discuss this today.  She continues to have aspects of atypical chest pain.  She is followed by cardiology for this.  This is a chronic issue.  Patient is due for pulmonary function testing that was ordered in March/2021.  We will discuss this today.  Walk today in office stable, patient able to complete 3 laps without any oxygen desaturations.   03/24/2020- Interim hx Patient presents today for acute visit for left pleuritic pain. Needs CXR prior to visit, LM.           Allergies  Allergen Reactions  . Penicillins Hives, Itching and Swelling    Tongue swells up Has patient had a PCN reaction causing immediate rash, facial/tongue/throat swelling, SOB or lightheadedness with hypotension: Yes Has patient had a PCN reaction causing severe rash involving mucus membranes or skin necrosis: Yes Has patient had a PCN reaction that required hospitalization Yes Has patient had a PCN reaction occurring within the last 10 years: No If all of the above answers are "NO", then may proceed with Cephalosporin use.     Immunization History  Administered Date(s) Administered  . Influenza Split 03/13/2017  . Influenza Whole 04/26/2018  . Influenza,inj,Quad PF,6+ Mos 03/09/2019  . Influenza-Unspecified 03/13/2016  . PFIZER SARS-COV-2 Vaccination 08/19/2019, 09/18/2019    Past Medical History:  Diagnosis Date  . Arthritis    especially in shoulders  . Asthma   . Depression   . Diabetes mellitus   . GERD (gastroesophageal reflux disease)   . Headache(784.0)    "mild"  . Hypertension   . Mental disorder    depression  . Neuropathy    feet   . Pain    arthritis pain - takes tramadol as needed  . Rectal polyp    very little bleeding with bowel movements- no pain    Tobacco History: Social History   Tobacco Use  Smoking Status Former Smoker  . Packs/day: 0.50  . Years: 20.00  . Pack years: 10.00  . Types: Cigarettes  . Quit date: 01/05/2018  .  Years since quitting: 2.2  Smokeless Tobacco Never Used  Tobacco Comment   Unknown    Counseling given: Not Answered Comment: Unknown    Outpatient Medications Prior to Visit  Medication Sig Dispense Refill  . albuterol (PROVENTIL) (2.5 MG/3ML) 0.083% nebulizer solution Take 3 mLs (2.5 mg total) by nebulization every 6 (six) hours as needed for wheezing or shortness of breath. 75 mL 12  . albuterol (VENTOLIN HFA) 108 (90 Base) MCG/ACT inhaler Inhale 2 puffs into the lungs every 4 (four)  hours as needed for wheezing. 1 g 5  . amLODipine (NORVASC) 10 MG tablet Take 10 mg by mouth daily.   2  . aspirin EC 81 MG tablet Take 81 mg by mouth daily.    Marland Kitchen atorvastatin (LIPITOR) 20 MG tablet Take 20 mg by mouth daily.    . busPIRone (BUSPAR) 10 MG tablet Take 10 mg by mouth as needed.    . fenofibrate (TRICOR) 145 MG tablet TAKE 1 TABLET(145 MG) BY MOUTH DAILY (Patient taking differently: 145 mg. ) 90 tablet 1  . fluticasone (FLONASE) 50 MCG/ACT nasal spray Place 1-2 sprays into both nostrils as needed.    . gabapentin (NEURONTIN) 300 MG capsule Take 600 mg by mouth 3 (three) times daily.     . hydrochlorothiazide (MICROZIDE) 12.5 MG capsule Take 1 capsule (12.5 mg total) by mouth daily. 30 capsule 0  . losartan (COZAAR) 50 MG tablet Take 1 tablet (50 mg total) by mouth daily. 30 tablet 0  . metoprolol succinate (TOPROL-XL) 50 MG 24 hr tablet TAKE 1 TABLET(50 MG) BY MOUTH DAILY WITH OR IMMEDIATELY FOLLOWING A MEAL 90 tablet 3  . Multiple Vitamin (MULTIVITAMIN ADULT PO) Take 1 tablet by mouth daily.    . pantoprazole (PROTONIX) 40 MG tablet Take 40 mg by mouth daily.    . Pirfenidone (ESBRIET) 267 MG TABS Take 3 tablets (801 mg total) by mouth in the morning, at noon, and at bedtime. 270 tablet 11  . sertraline (ZOLOFT) 50 MG tablet Take 1 tablet (50 mg total) by mouth daily. 30 tablet 0  . SYMBICORT 160-4.5 MCG/ACT inhaler Inhale 2 puffs into the lungs 2 (two) times daily. 1 Inhaler 5  . Tiotropium Bromide Monohydrate (SPIRIVA RESPIMAT) 1.25 MCG/ACT AERS Inhale 2 puffs into the lungs daily. 24 g 5  . TOUJEO SOLOSTAR 300 UNIT/ML SOPN Inject 90 Units into the skin at bedtime. 50 in th A.M. AND 40 IN THE P.M.  1   No facility-administered medications prior to visit.      Review of Systems  Review of Systems   Physical Exam  There were no vitals taken for this visit. Physical Exam   Lab Results:  CBC    Component Value Date/Time   WBC 6.6 05/09/2019 0133   RBC 3.54 (L)  05/09/2019 0133   HGB 11.5 (L) 05/09/2019 0133   HGB 11.0 (L) 08/29/2018 0915   HGB 12.5 03/02/2017 1428   HCT 32.7 (L) 05/09/2019 0133   HCT 35.4 03/02/2017 1428   PLT 181 05/09/2019 0133   PLT 171 08/29/2018 0915   PLT 192 03/02/2017 1428   MCV 92.4 05/09/2019 0133   MCV 94.3 03/02/2017 1428   MCH 32.5 05/09/2019 0133   MCHC 35.2 05/09/2019 0133   RDW 15.5 05/09/2019 0133   RDW 14.6 (H) 03/02/2017 1428   LYMPHSABS 0.9 08/29/2018 0915   LYMPHSABS 1.1 03/02/2017 1428   MONOABS 0.3 08/29/2018 0915   MONOABS 0.4 03/02/2017 1428   EOSABS 0.1  08/29/2018 0915   EOSABS 0.1 03/02/2017 1428   BASOSABS 0.0 08/29/2018 0915   BASOSABS 0.1 03/02/2017 1428    BMET    Component Value Date/Time   NA 140 10/30/2019 1657   NA 131 (L) 03/02/2017 1428   K 4.2 10/30/2019 1657   K 4.8 03/02/2017 1428   CL 107 (H) 10/30/2019 1657   CO2 21 10/30/2019 1657   CO2 24 03/02/2017 1428   GLUCOSE 78 10/30/2019 1657   GLUCOSE 502 (HH) 05/11/2019 2220   GLUCOSE 482 (H) 03/02/2017 1428   BUN 19 10/30/2019 1657   BUN 20.0 03/02/2017 1428   CREATININE 1.31 (H) 10/30/2019 1657   CREATININE 1.38 (H) 08/29/2018 0915   CREATININE 1.7 (H) 03/02/2017 1428   CALCIUM 9.1 10/30/2019 1657   CALCIUM 9.2 03/02/2017 1428   GFRNONAA 43 (L) 10/30/2019 1657   GFRNONAA 40 (L) 08/29/2018 0915   GFRAA 49 (L) 10/30/2019 1657   GFRAA 47 (L) 08/29/2018 0915    BNP No results found for: BNP  ProBNP No results found for: PROBNP  Imaging: No results found.   Assessment & Plan:   No problem-specific Assessment & Plan notes found for this encounter.     Martyn Ehrich, NP 03/23/2020

## 2020-03-23 NOTE — Telephone Encounter (Signed)
Called pt but line went straight to VM. Left message for her to return call.

## 2020-03-23 NOTE — Telephone Encounter (Signed)
Called spoke with patient she states she is having upper left chest sharp pain on occasion hits her. Walking up and down the steps is when she notices it most. Patient wants to be seen in office. She is not having any other symptoms.   Appointment made 03/24/20 at 12 with Derl Barrow NP

## 2020-03-23 NOTE — Progress Notes (Signed)
Erica [name] Date of Birth: Jun 13, 1956 MRN: [id number] Primary Care Provider:Avbuere, Christean Grief, MD Former Cardiology Providers: Dr. Adrian Prows, Jeri Lager, APRN, FNP-C Primary Cardiologist: Rex Kras, DO, [facility] (established care 11/19/2019)  Date: 03/23/20 Last Office Visit: 11/19/2019  Chief Complaint  Patient presents with  . Coronary Artery Disease    HPI   Erica [name] is a 64 y.o.  female who presents to the office with a chief complaint of " CAD and lipid management." Patient's past medical history and cardiovascular risk factors include: rectal CA in 2014 that is in remission, T2DM, former tobacco use, emphysema, interstitial lung disease, idiopathic pulmonary fibrosis, and coronary artery calcification on CT scan.   Given the coronary calcification patient had undergone subsequently an echocardiogram and stress test. Results are noted below for further reference. Since then shared decision was to improve her modifiable cardiovascular risk factors.  Patient was also started on aspirin 81 mg p.o. daily and statin therapy.  Follow-up lipid profile noted continued elevated triglyceride levels and at the last visit was started on TriCor.  Patient was supposed to have a repeat fasting lipid profile prior to today's office visit but was unable to do so.  Patient states that she is overall able to tolerate her medication without any side effects or intolerances.  From a cardiovascular standpoint patient states that she is doing well at times she has sporadic discomfort over the left anterior chest wall, intensity 7 out of 10, last for few seconds, self-limited, the pain is not brought on by effort related activities at times does improve with rest.  She has undergone an ischemic evaluation in the recent past which was again reviewed with her in detail at today's visit.  Patient is office blood pressures were better upon rechecking them at the end of the visit.  Still not at goal.  Currently  managed by primary team.  Patient is asked to keep a log of her blood pressures and to review it further with her PCP.  Medications reconciled.  ALLERGIES: Allergies  Allergen Reactions  . Penicillins Hives, Itching and Swelling    Tongue swells up Has patient had a PCN reaction causing immediate rash, facial/tongue/throat swelling, SOB or lightheadedness with hypotension: Yes Has patient had a PCN reaction causing severe rash involving mucus membranes or skin necrosis: Yes Has patient had a PCN reaction that required hospitalization Yes Has patient had a PCN reaction occurring within the last 10 years: No If all of the above answers are "NO", then may proceed with Cephalosporin use.      MEDICATION LIST PRIOR TO VISIT: Current Outpatient Medications on File Prior to Visit  Medication Sig Dispense Refill  . albuterol (PROVENTIL) (2.5 MG/3ML) 0.083% nebulizer solution Take 3 mLs (2.5 mg total) by nebulization every 6 (six) hours as needed for wheezing or shortness of breath. 75 mL 12  . albuterol (VENTOLIN HFA) 108 (90 Base) MCG/ACT inhaler Inhale 2 puffs into the lungs every 4 (four) hours as needed for wheezing. 1 g 5  . amLODipine (NORVASC) 10 MG tablet Take 10 mg by mouth daily.   2  . aspirin EC 81 MG tablet Take 81 mg by mouth daily.    Marland Kitchen atorvastatin (LIPITOR) 20 MG tablet Take 20 mg by mouth daily.    . busPIRone (BUSPAR) 10 MG tablet Take 10 mg by mouth as needed.    . fenofibrate (TRICOR) 145 MG tablet TAKE 1 TABLET(145 MG) BY MOUTH DAILY (Patient taking differently: 145 mg. )  90 tablet 1  . fluticasone (FLONASE) 50 MCG/ACT nasal spray Place 1-2 sprays into both nostrils as needed.    . gabapentin (NEURONTIN) 300 MG capsule Take 600 mg by mouth 3 (three) times daily.     . hydrochlorothiazide (MICROZIDE) 12.5 MG capsule Take 1 capsule (12.5 mg total) by mouth daily. 30 capsule 0  . losartan (COZAAR) 50 MG tablet Take 1 tablet (50 mg total) by mouth daily. 30 tablet 0  .  metoprolol succinate (TOPROL-XL) 50 MG 24 hr tablet TAKE 1 TABLET(50 MG) BY MOUTH DAILY WITH OR IMMEDIATELY FOLLOWING A MEAL 90 tablet 3  . Multiple Vitamin (MULTIVITAMIN ADULT PO) Take 1 tablet by mouth daily.    . pantoprazole (PROTONIX) 40 MG tablet Take 40 mg by mouth daily.    . Pirfenidone (ESBRIET) 267 MG TABS Take 3 tablets (801 mg total) by mouth in the morning, at noon, and at bedtime. 270 tablet 11  . sertraline (ZOLOFT) 50 MG tablet Take 1 tablet (50 mg total) by mouth daily. 30 tablet 0  . SYMBICORT 160-4.5 MCG/ACT inhaler Inhale 2 puffs into the lungs 2 (two) times daily. 1 Inhaler 5  . Tiotropium Bromide Monohydrate (SPIRIVA RESPIMAT) 1.25 MCG/ACT AERS Inhale 2 puffs into the lungs daily. 24 g 5  . TOUJEO SOLOSTAR 300 UNIT/ML SOPN Inject 90 Units into the skin at bedtime. 50 in th A.M. AND 40 IN THE P.M.  1   No current facility-administered medications on file prior to visit.    PAST MEDICAL HISTORY: Past Medical History:  Diagnosis Date  . Arthritis    especially in shoulders  . Asthma   . Depression   . Diabetes mellitus   . GERD (gastroesophageal reflux disease)   . Headache(784.0)    "mild"  . Hypertension   . Mental disorder    depression  . Neuropathy    feet   . Pain    arthritis pain - takes tramadol as needed  . Rectal polyp    very little bleeding with bowel movements- no pain    PAST SURGICAL HISTORY: Past Surgical History:  Procedure Laterality Date  . ANAL RECTAL MANOMETRY N/A 07/13/2016   Procedure: ANO RECTAL MANOMETRY;  Surgeon: Leighton Ruff, MD;  Location: [facility];  Service: Endoscopy;  Laterality: N/A;  . CESAREAN SECTION    . EUS N/A 11/21/2012   Procedure: LOWER ENDOSCOPIC ULTRASOUND (EUS);  Surgeon: Arta Silence, MD;  Location: [facility];  Service: Endoscopy;  Laterality: N/A;  . FLEXIBLE SIGMOIDOSCOPY N/A 11/21/2012   Procedure: FLEXIBLE SIGMOIDOSCOPY;  Surgeon: Arta Silence, MD;  Location: [facility];  Service: Endoscopy;   Laterality: N/A;  . FLEXIBLE SIGMOIDOSCOPY N/A 01/28/2013   Procedure: FLEXIBLE SIGMOIDOSCOPY;  Surgeon: Leighton Ruff, MD;  Location: [facility];  Service: Endoscopy;  Laterality: N/A;  . LAPAROSCOPIC LOW ANTERIOR RESECTION N/A 01/29/2013   Procedure: LAPAROSCOPIC LOW ANTERIOR RESECTION, Rigid Proctoscopy;  Surgeon: Leighton Ruff, MD;  Location: [facility];  Service: General;  Laterality: N/A;  . LAPAROSCOPIC SIGMOID COLECTOMY N/A 11/14/2012   Procedure: DIAGNOSTIC LAPAROSCOPY AND SIGMOIDMOIDOSCOPY ;  Surgeon: Rolm Bookbinder, MD;  Location: [facility];  Service: General;  Laterality: N/A;  . RECTAL ULTRASOUND N/A 07/13/2016   Procedure: RECTAL ULTRASOUND;  Surgeon: Leighton Ruff, MD;  Location: [facility];  Service: Endoscopy;  Laterality: N/A;  . TONSILLECTOMY    . TONSILLECTOMY AND ADENOIDECTOMY      FAMILY HISTORY: The patient's family history includes Cancer in her maternal aunt, maternal uncle, maternal uncle, maternal uncle,  paternal aunt, and paternal uncle; Cancer (age of onset: 46) in an other family member; Diabetes in her father; Heart disease in her mother; Hyperlipidemia in her mother; Hypertension in her brother, brother, father, mother, sister, and sister; Stroke in her father.   SOCIAL HISTORY:  The patient  reports that she quit smoking about 2 years ago. Her smoking use included cigarettes. She has a 10.00 pack-year smoking history. She has never used smokeless tobacco. She reports previous alcohol use. She reports current drug use. Drug: "Crack" cocaine.  Review of Systems  Constitutional: Negative for chills and fever.  HENT: Negative for hoarse voice and nosebleeds.   Eyes: Negative for discharge, double vision and pain.  Cardiovascular: Negative for chest pain, claudication, leg swelling, near-syncope, orthopnea, palpitations, paroxysmal nocturnal dyspnea and syncope.  Respiratory: Positive for shortness of breath (chronic and stable). Negative for hemoptysis.    Musculoskeletal: Negative for muscle cramps and myalgias.  Gastrointestinal: Negative for abdominal pain, constipation, diarrhea, hematemesis, hematochezia, melena, nausea and vomiting.  Neurological: Positive for light-headedness (chronic and stable). Negative for dizziness.    PHYSICAL EXAM: Vitals with BMI 03/23/2020 12/31/2019 11/19/2019  Height _0  _1  _2   Weight 189 lbs 189 lbs 3 oz 192 lbs  BMI 33.49 87.56 43.32  Systolic 951 884 166  Diastolic 85 78 80  Pulse 81 60 73    CONSTITUTIONAL: Well-developed and well-nourished. No acute distress.  SKIN: Skin is warm and dry. No rash noted. No cyanosis. No pallor. No jaundice HEAD: Normocephalic and atraumatic.  EYES: No scleral icterus MOUTH/THROAT: Moist oral membranes.  NECK: No JVD present. No thyromegaly noted. No carotid bruits  LYMPHATIC: No visible cervical adenopathy.  CHEST Normal respiratory effort. No intercostal retractions  LUNGS: Clear to auscultation bilaterally.  No stridor. No wheezes. No rales.  CARDIOVASCULAR: Regular rate and rhythm, positive S1-S2, no murmurs rubs or gallops appreciated. ABDOMINAL: Obese, soft, nontender, nondistended, positive bowel sounds all 4 quadrants. No apparent ascites.  EXTREMITIES: No peripheral edema  HEMATOLOGIC: No significant bruising NEUROLOGIC: Oriented to person, place, and time. Nonfocal. Normal muscle tone.  PSYCHIATRIC: Normal mood and affect. Normal behavior. Cooperative  RADIOLOGY: CT of chest 04/12/2019:  1. The appearance of the lungs remains compatible with interstitial lung disease, with a spectrum of findings considered diagnostic of usual interstitial pneumonia (UIP) per current ATS guidelines. Minimal progression compared to the prior study. 2. Aortic atherosclerosis, in addition to left anterior descending coronary artery disease.  3. Cholelithiasis. Aortic Atherosclerosis (ICD10-I70.0).  CARDIAC DATABASE: EKG: 07/08/2019: Normal sinus rhythm at 70  bpm, left atrial abnormality, left axis deviation, PRWP cannot exclude anterior infarct old.   Echocardiogram: 07/16/2019: LVEF 56%, moderate LVH, grade 1 diastolic impairment, moderately dilated left atrium, moderate AR, trace pericardial effusion.  Stress Testing:  Lexiscan tetrofosmin stress test 07/22/2019: There is  apical thinning an mild soft tissue attenuation artifact in inferior wall without ischemia or infarct. All segments of left ventricle demonstrated normal wall motion and thickening. Stress LV EF is moderately dysfunctional 39%, however visually appearted to be normal. No previous exam available for comparison. Low risk study.   Heart Catheterization: None  LABORATORY DATA: CBC Latest Ref Rng & Units 05/09/2019 08/29/2018 08/30/2017  WBC 4.0 - 10.5 K/uL 6.6 4.2 4.5  Hemoglobin 12.0 - 15.0 g/dL 11.5(L) 11.0(L) 11.0(L)  Hematocrit 36 - 46 % 32.7(L) 32.0(L) 31.0(L)  Platelets 150 - 400 K/uL 181 171 166    CMP Latest Ref Rng & Units 01/28/2020 10/30/2019 09/20/2019  Glucose  65 - 99 mg/dL - 78 -  BUN 8 - 27 mg/dL - 19 -  Creatinine 0.57 - 1.00 mg/dL - 1.31(H) -  Sodium 134 - 144 mmol/L - 140 -  Potassium 3.5 - 5.2 mmol/L - 4.2 -  Chloride 96 - 106 mmol/L - 107(H) -  CO2 20 - 29 mmol/L - 21 -  Calcium 8.7 - 10.3 mg/dL - 9.1 -  Total Protein 6.0 - 8.3 g/dL 8.1 7.6 7.7  Total Bilirubin 0.2 - 1.2 mg/dL 0.5 0.5 0.6  Alkaline Phos 39 - 117 U/L 103 100 98  AST 0 - 37 U/L _0 ALT 0 - 35 U/L _1 Lipid Panel     Component Value Date/Time   CHOL 144 10/30/2019 1657   TRIG 169 (H) 10/30/2019 1657   HDL 58 10/30/2019 1657   CHOLHDL 2.5 10/30/2019 1657   CHOLHDL 2.6 Ratio 08/21/2008 2129   VLDL 25 08/21/2008 2129   LDLCALC 58 10/30/2019 1657   LABVLDL 28 10/30/2019 1657    Lab Results  Component Value Date   HGBA1C 7.9 (H) 05/11/2019   No components found for: NTPROBNP Lab Results  Component Value Date   TSH 2.562 08/21/2008    Cardiac Panel (last 3  results) No results for input(s): CKTOTAL, CKMB, TROPONINIHS, RELINDX in the last 72 hours.  IMPRESSION:    ICD-10-CM   1. Coronary atherosclerosis due to calcified coronary lesion of native artery  I25.10    I25.84   2. Mixed hyperlipidemia  E78.2 LDL cholesterol, direct    Lipid Panel With LDL/HDL Ratio    Comprehensive Metabolic Panel (CMET)  3. Hypertriglyceridemia  E78.1 LDL cholesterol, direct    Lipid Panel With LDL/HDL Ratio    Comprehensive Metabolic Panel (CMET)  4. Benign hypertension with CKD (chronic kidney disease) stage III (HCC)  I12.9    N18.30   5. Non-insulin dependent type 2 diabetes mellitus ([place])  E11.9   6. Former tobacco use  Z87.891      RECOMMENDATIONS: Erica [name] is a 64 y.o. female whose past medical history and cardiovascular risk factors include: rectal CA in 2014 that is in remission, T2DM, former tobacco use, emphysema, interstitial lung disease, idiopathic pulmonary fibrosis, and was initially evaluated by [place] for coronary artery calcification on CT scan.   Coronary artery calcification:  Given the coronary artery calcification patient has undergone an echo and stress test results which were reviewed with her and noted above for further reference.  Since last visit patient has atypical chest pain would recommend monitoring her symptoms, continue current medical therapy, patient will be evaluated by her pulmonologist tomorrow as well.    Patient is asked to seek medical attention if her symptoms increase in intensity, frequency, duration or has typical chest pain as discussed at today's visit.    Continue aspirin and statin therapy.    Hyperlipidemia:  Continue Lipitor.  Check fasting lipid profile, direct LDL.    Hypertriglyceridemia:  Since last visit patient was started on TriCor.  Patient is tolerating the medication well without any side effects or intolerances.  Recheck fasting lipid profile to reevaluate indices.  Benign essential  hypertension with chronic kidney disease stage III . Office blood pressure are improving. . Medications were difficult to reconcile at today's visit. . Patient is asked to keep a log of her blood pressures and to review it with her PCP.  Currently managed by primary care provider.   FINAL MEDICATION  LIST END OF ENCOUNTER: No orders of the defined types were placed in this encounter.    Current Outpatient Medications:  .  albuterol (PROVENTIL) (2.5 MG/3ML) 0.083% nebulizer solution, Take 3 mLs (2.5 mg total) by nebulization every 6 (six) hours as needed for wheezing or shortness of breath., Disp: 75 mL, Rfl: 12 .  albuterol (VENTOLIN HFA) 108 (90 Base) MCG/ACT inhaler, Inhale 2 puffs into the lungs every 4 (four) hours as needed for wheezing., Disp: 1 g, Rfl: 5 .  amLODipine (NORVASC) 10 MG tablet, Take 10 mg by mouth daily. , Disp: , Rfl: 2 .  aspirin EC 81 MG tablet, Take 81 mg by mouth daily., Disp: , Rfl:  .  atorvastatin (LIPITOR) 20 MG tablet, Take 20 mg by mouth daily., Disp: , Rfl:  .  busPIRone (BUSPAR) 10 MG tablet, Take 10 mg by mouth as needed., Disp: , Rfl:  .  fenofibrate (TRICOR) 145 MG tablet, TAKE 1 TABLET(145 MG) BY MOUTH DAILY (Patient taking differently: 145 mg. ), Disp: 90 tablet, Rfl: 1 .  fluticasone (FLONASE) 50 MCG/ACT nasal spray, Place 1-2 sprays into both nostrils as needed., Disp: , Rfl:  .  gabapentin (NEURONTIN) 300 MG capsule, Take 600 mg by mouth 3 (three) times daily. , Disp: , Rfl:  .  hydrochlorothiazide (MICROZIDE) 12.5 MG capsule, Take 1 capsule (12.5 mg total) by mouth daily., Disp: 30 capsule, Rfl: 0 .  losartan (COZAAR) 50 MG tablet, Take 1 tablet (50 mg total) by mouth daily., Disp: 30 tablet, Rfl: 0 .  metoprolol succinate (TOPROL-XL) 50 MG 24 hr tablet, TAKE 1 TABLET(50 MG) BY MOUTH DAILY WITH OR IMMEDIATELY FOLLOWING A MEAL, Disp: 90 tablet, Rfl: 3 .  Multiple Vitamin (MULTIVITAMIN ADULT PO), Take 1 tablet by mouth daily., Disp: , Rfl:  .   pantoprazole (PROTONIX) 40 MG tablet, Take 40 mg by mouth daily., Disp: , Rfl:  .  Pirfenidone (ESBRIET) 267 MG TABS, Take 3 tablets (801 mg total) by mouth in the morning, at noon, and at bedtime., Disp: 270 tablet, Rfl: 11 .  sertraline (ZOLOFT) 50 MG tablet, Take 1 tablet (50 mg total) by mouth daily., Disp: 30 tablet, Rfl: 0 .  SYMBICORT 160-4.5 MCG/ACT inhaler, Inhale 2 puffs into the lungs 2 (two) times daily., Disp: 1 Inhaler, Rfl: 5 .  Tiotropium Bromide Monohydrate (SPIRIVA RESPIMAT) 1.25 MCG/ACT AERS, Inhale 2 puffs into the lungs daily., Disp: 24 g, Rfl: 5 .  TOUJEO SOLOSTAR 300 UNIT/ML SOPN, Inject 90 Units into the skin at bedtime. 50 in th A.M. AND 40 IN THE P.M., Disp: , Rfl: 1  Orders Placed This Encounter  Procedures  . LDL cholesterol, direct  . Lipid Panel With LDL/HDL Ratio  . Comprehensive Metabolic Panel (CMET)   --Continue cardiac medications as reconciled in final medication list. --Return in about 1 year (around 03/23/2021) for Follow up, Lipid, CAC . Or sooner if needed. --Continue follow-up with your primary care physician regarding the management of your other chronic comorbid conditions.  Patient's questions and concerns were addressed to her satisfaction. She voices understanding of the instructions provided during this encounter.   This note was created using a voice recognition software as a result there may be grammatical errors inadvertently enclosed that do not reflect the nature of this encounter. Every attempt is made to correct such errors.  Rex Kras, [place], [facility]  Pager: [phone] Office: [phone]

## 2020-03-24 ENCOUNTER — Ambulatory Visit: Payer: Medicare HMO | Admitting: Primary Care

## 2020-03-24 DIAGNOSIS — R0781 Pleurodynia: Secondary | ICD-10-CM

## 2020-03-28 ENCOUNTER — Other Ambulatory Visit: Payer: Self-pay | Admitting: Cardiology

## 2020-03-30 ENCOUNTER — Encounter: Payer: Self-pay | Admitting: Primary Care

## 2020-03-30 ENCOUNTER — Ambulatory Visit (INDEPENDENT_AMBULATORY_CARE_PROVIDER_SITE_OTHER): Payer: Medicare HMO

## 2020-03-30 ENCOUNTER — Other Ambulatory Visit: Payer: Self-pay

## 2020-03-30 ENCOUNTER — Ambulatory Visit (INDEPENDENT_AMBULATORY_CARE_PROVIDER_SITE_OTHER): Payer: Medicare HMO | Admitting: Primary Care

## 2020-03-30 ENCOUNTER — Telehealth: Payer: Self-pay | Admitting: Primary Care

## 2020-03-30 VITALS — BP 132/80 | HR 79 | Temp 97.6°F | Ht 63.0 in | Wt 185.0 lb

## 2020-03-30 DIAGNOSIS — J84112 Idiopathic pulmonary fibrosis: Secondary | ICD-10-CM

## 2020-03-30 DIAGNOSIS — J439 Emphysema, unspecified: Secondary | ICD-10-CM | POA: Diagnosis not present

## 2020-03-30 DIAGNOSIS — J849 Interstitial pulmonary disease, unspecified: Secondary | ICD-10-CM | POA: Diagnosis not present

## 2020-03-30 DIAGNOSIS — J189 Pneumonia, unspecified organism: Secondary | ICD-10-CM

## 2020-03-30 DIAGNOSIS — R0781 Pleurodynia: Secondary | ICD-10-CM | POA: Diagnosis not present

## 2020-03-30 IMAGING — DX DG CHEST 2V
2 series · 2 of 2 positions shown · non-contrast
Comparison: [DATE] CT chest. [DATE] chest radiograph and
prior.

CLINICAL DATA: Left-sided pleuritic pain.

EXAM:
CHEST - 2 VIEW

[chest pa]
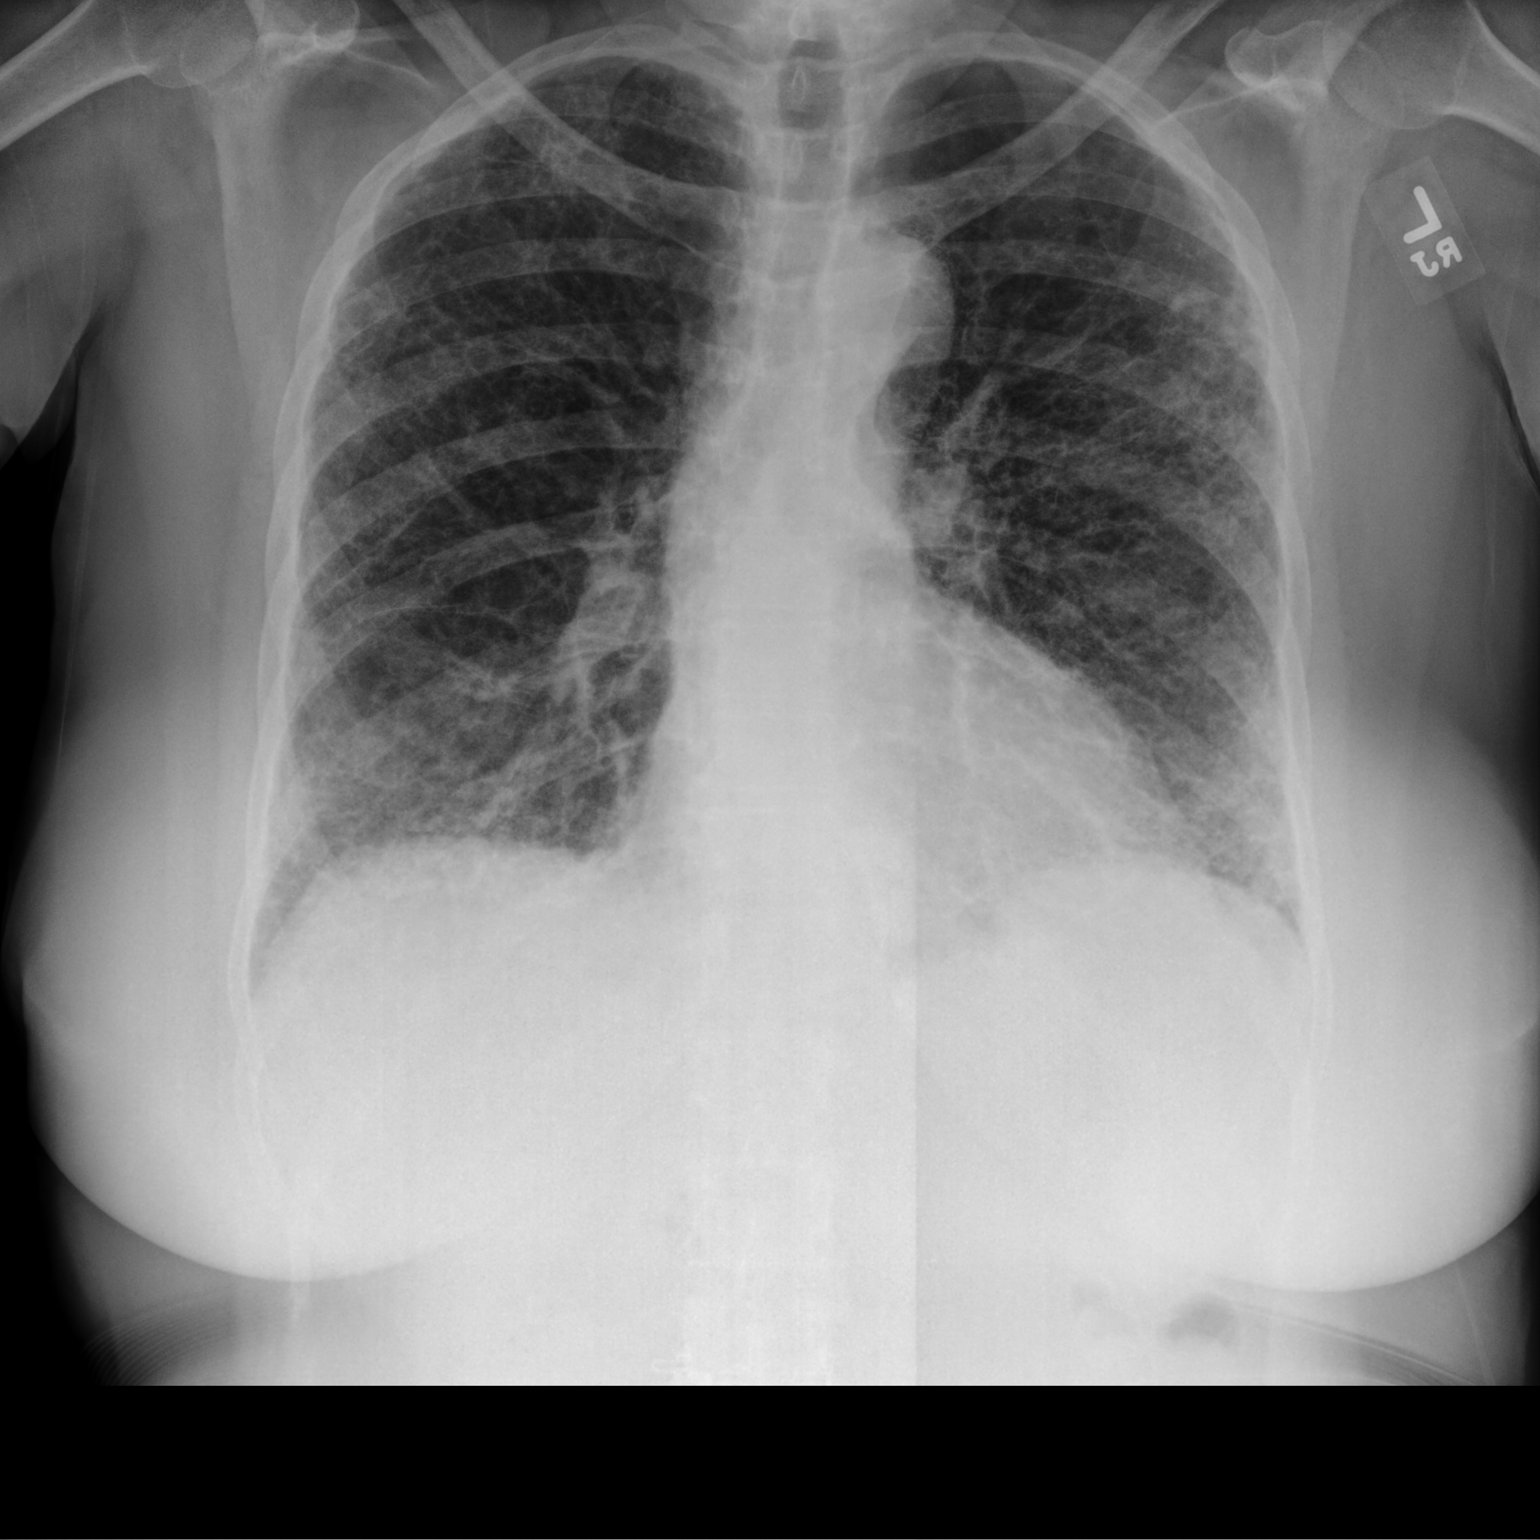

[chest lat]
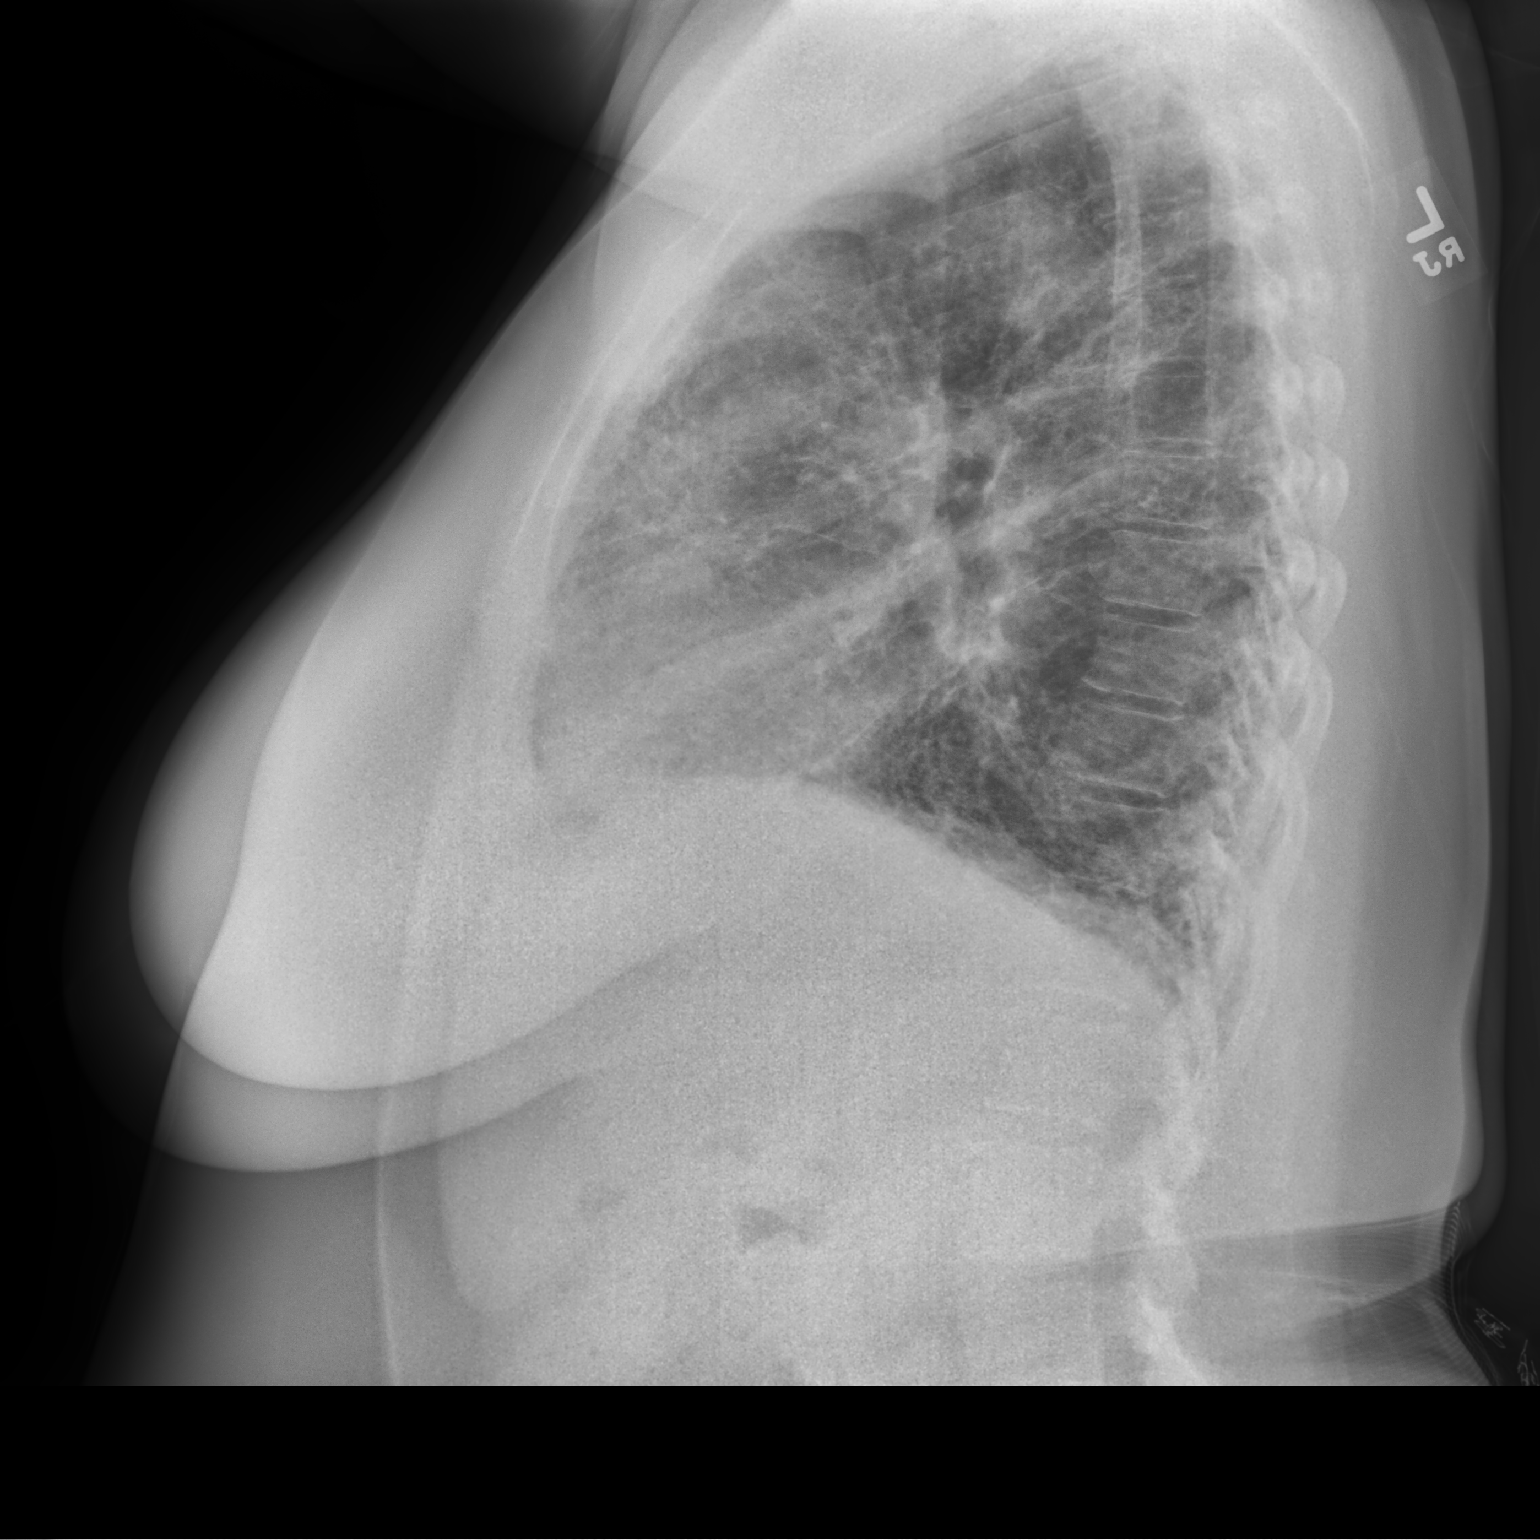

[2 of 2 positions shown; findings below may reference images not displayed]

FINDINGS: Diffuse interstitial prominence, more conspicuous than prior
radiograph. Patchy peripheral left upper lung and bibasilar
opacities. No pneumothorax or pleural effusion. Cardiomediastinal
silhouette within normal limits. No acute osseous abnormality.
IMPRESSION: Sequela of chronic lung disease.

Patchy periphery left upper and bibasilar opacities. Differential
includes atelectasis, infection and additional foci of fibrosis.

## 2020-03-30 MED ORDER — DOXYCYCLINE HYCLATE 100 MG PO TABS: 100.0000 mg | ORAL_TABLET | Freq: Two times a day (BID) | ORAL | 0 refills | Status: DC

## 2020-03-30 NOTE — Assessment & Plan Note (Addendum)
-  Continue Symbicort 160 two puffs twice daily and Spiriva 1.70mg two puffs once daily

## 2020-03-30 NOTE — Assessment & Plan Note (Addendum)
-  Breathing is baseline, chronic cough. Tolerating Esbriet 872m TID.  - HRCT in October 2020 showed minimal progression usual interstitial pneumonia (UIP).  - Overdue for PFTs, last pulmonary function testing was in December 2020.  - [facility] FU [facility] 1 month with [facility]

## 2020-03-30 NOTE — Telephone Encounter (Signed)
Patient had [employer] and received a letter that she can no longer get Esbriet filled at [facility]. Do you know where I should send prescription to that would be in network for her?

## 2020-03-30 NOTE — Assessment & Plan Note (Signed)
-  Complains of intermittent left sided pleuritic pain x 1 month and cough with yellow mucus.  - CXR today showed patchy periphery lefty upper and bibasilar opacities.  - RX doxycycline 1 tab twice daily x 7 days  - Recommend patient take mucinex 636m twice daily and use IS 3x/day

## 2020-03-30 NOTE — Progress Notes (Signed)
_0  ID: Erica [name], female    DOB: 12-Oct-1955, 64 y.o.   MRN: [id number]  Chief Complaint  Patient presents with  . Acute Visit    Pt has had pain on left side of chest which she states is sometimes in her back. This has been going on x1 month. Pt has an occ cough with yellow phlegm and also has had some increased SOB. Pt denies any complaints of wheezing.    Referring provider: Nolene Ebbs, MD  HPI: 64 year old female former smoker followed in our office for COPD and interstitial lung disease Past medical history: Rectal cancer, GERD Smoking history: Former smoker, quit 2019, 10-pack-year smoking history Maintenance: Symbicort 160, Spiriva Respimat 1.25, Esbriet Patient of Dr. Chase Caller  Previous [facility] pulmonary encounter: 12/31/2019  64 year old female former smoker followed in our office for COPD and interstitial lung disease.  Patient last completed follow-up with our office in April/2021 that was a virtual visit at that period of time is recommended that she seek emergent evaluation due to her atypical chest pain.  She was also encouraged to seek follow-up with primary care due to worsened anxiety.  She was encouraged to remain on Symbicort 160, Spiriva Respimat as well as continue with pulmonary rehab and continue her Esbriet.  She is followed in our office by Dr. Chase Caller.  Patient presenting to office today as a follow-up.  She reports that she is still having her baseline dyspnea.  She reports adherence to Symbicort 160 and Spiriva Respimat 1.25.  She continues to take Esbriet.  Last lab work from 2 months ago shows stable LFTs.  Patient reports that she feels tired.  She is unsure if this is related to Esbriet.  She never went back to pulmonary rehab.  We will discuss this today.  She continues to have aspects of atypical chest pain.  She is followed by cardiology for this.  This is a chronic issue.  Patient is due for pulmonary function testing that was ordered in  March/2021.  We will discuss this today.  Walk today in office stable, patient able to complete 3 laps without any oxygen desaturations.   03/24/2020- Interim hx Patient presents today for acute visit for left pleuritic pain. Hx COPD and ILD. Maintained on Symbicort 160, Spiriva Respimat 1.72mg and Esbriet TID.  Patient reports having left sided back pain x 1 month. Associated cough with yellow mucus. Her breathing is actually a little better. Left sided chest pain does not occur every day, happens mainly with exertion and last for 1 min. She is fairly complaint with her inhalers. She trys to see if she can do without them 1-2 days a week. She walks her dog twice a day without stopping but she is fatigued afterwards. Appetite is good, she is occasionally nauseous. Her weight is stable, she is down 4 lbs since July 2021. Sleeps well at night. Denies fever, chills, chest tightness, wheezing.    Allergies  Allergen Reactions  . Penicillins Hives, Itching and Swelling    Tongue swells up Has patient had a PCN reaction causing immediate rash, facial/tongue/throat swelling, SOB or lightheadedness with hypotension: Yes Has patient had a PCN reaction causing severe rash involving mucus membranes or skin necrosis: Yes Has patient had a PCN reaction that required hospitalization Yes Has patient had a PCN reaction occurring within the last 10 years: No If all of the above answers are "NO", then may proceed with Cephalosporin use.     Immunization History  Administered Date(s)  Administered  . Influenza Split 03/13/2017  . Influenza Whole 04/26/2018  . Influenza, High Dose Seasonal PF 02/29/2020  . Influenza,inj,Quad PF,6+ Mos 03/09/2019  . Influenza-Unspecified 03/13/2016  . PFIZER SARS-COV-2 Vaccination 08/19/2019, 09/18/2019    Past Medical History:  Diagnosis Date  . Arthritis    especially in shoulders  . Asthma   . Depression   . Diabetes mellitus   . GERD (gastroesophageal reflux  disease)   . Headache(784.0)    "mild"  . Hypertension   . Mental disorder    depression  . Neuropathy    feet   . Pain    arthritis pain - takes tramadol as needed  . Rectal polyp    very little bleeding with bowel movements- no pain    Tobacco History: Social History   Tobacco Use  Smoking Status Former Smoker  . Packs/day: 0.50  . Years: 20.00  . Pack years: 10.00  . Types: Cigarettes  . Quit date: 01/05/2018  . Years since quitting: 2.2  Smokeless Tobacco Never Used  Tobacco Comment   Unknown    Counseling given: Not Answered Comment: Unknown    Outpatient Medications Prior to Visit  Medication Sig Dispense Refill  . albuterol (PROVENTIL) (2.5 MG/3ML) 0.083% nebulizer solution Take 3 mLs (2.5 mg total) by nebulization every 6 (six) hours as needed for wheezing or shortness of breath. 75 mL 12  . albuterol (VENTOLIN HFA) 108 (90 Base) MCG/ACT inhaler Inhale 2 puffs into the lungs every 4 (four) hours as needed for wheezing. 1 g 5  . amLODipine (NORVASC) 10 MG tablet Take 10 mg by mouth daily.   2  . aspirin EC 81 MG tablet Take 81 mg by mouth daily.    . busPIRone (BUSPAR) 10 MG tablet Take 10 mg by mouth as needed.    . fenofibrate (TRICOR) 145 MG tablet TAKE 1 TABLET(145 MG) BY MOUTH DAILY (Patient taking differently: 145 mg. ) 90 tablet 1  . fluticasone (FLONASE) 50 MCG/ACT nasal spray Place 1-2 sprays into both nostrils as needed.    . gabapentin (NEURONTIN) 300 MG capsule Take 600 mg by mouth 3 (three) times daily.     . hydrochlorothiazide (MICROZIDE) 12.5 MG capsule Take 1 capsule (12.5 mg total) by mouth daily. 30 capsule 0  . losartan (COZAAR) 50 MG tablet Take 1 tablet (50 mg total) by mouth daily. 30 tablet 0  . metoprolol succinate (TOPROL-XL) 50 MG 24 hr tablet TAKE 1 TABLET(50 MG) BY MOUTH DAILY WITH OR IMMEDIATELY FOLLOWING A MEAL 90 tablet 3  . Multiple Vitamin (MULTIVITAMIN ADULT PO) Take 1 tablet by mouth daily.    . pantoprazole (PROTONIX) 40 MG  tablet Take 40 mg by mouth daily.    . Pirfenidone (ESBRIET) 267 MG TABS Take 3 tablets (801 mg total) by mouth in the morning, at noon, and at bedtime. 270 tablet 11  . sertraline (ZOLOFT) 50 MG tablet Take 1 tablet (50 mg total) by mouth daily. 30 tablet 0  . SYMBICORT 160-4.5 MCG/ACT inhaler Inhale 2 puffs into the lungs 2 (two) times daily. 1 Inhaler 5  . Tiotropium Bromide Monohydrate (SPIRIVA RESPIMAT) 1.25 MCG/ACT AERS Inhale 2 puffs into the lungs daily. 24 g 5  . TOUJEO SOLOSTAR 300 UNIT/ML SOPN Inject 90 Units into the skin at bedtime. 50 in th A.M. AND 40 IN THE P.M.  1  . atorvastatin (LIPITOR) 20 MG tablet Take 20 mg by mouth daily.     No facility-administered medications prior to  visit.    Review of Systems  Review of Systems  Constitutional: Negative.   Respiratory: Positive for cough. Negative for chest tightness and wheezing.        DOE    Physical Exam  BP 132/80 (BP Location: Left Arm, Cuff Size: Normal)   Pulse 79   Temp 97.6 F (36.4 C) (Oral)   Ht _0  (1.6 m)   Wt 185 lb (83.9 kg)   SpO2 97%   BMI 32.77 kg/m  Physical Exam Constitutional:      Appearance: Normal appearance.  HENT:     Head: Normocephalic and atraumatic.  Cardiovascular:     Rate and Rhythm: Normal rate and regular rhythm.  Pulmonary:     Effort: Pulmonary effort is normal.     Breath sounds: Rales present. No wheezing or rhonchi.     Comments: Scattered rales bilateral bases  Neurological:     General: No focal deficit present.     Mental Status: She is alert and oriented to person, place, and time. Mental status is at baseline.  Psychiatric:        Mood and Affect: Mood normal.        Behavior: Behavior normal.        Thought Content: Thought content normal.        Judgment: Judgment normal.      Lab Results:  CBC    Component Value Date/Time   WBC 6.6 05/09/2019 0133   RBC 3.54 (L) 05/09/2019 0133   HGB 11.5 (L) 05/09/2019 0133   HGB 11.0 (L) 08/29/2018 0915    HGB 12.5 03/02/2017 1428   HCT 32.7 (L) 05/09/2019 0133   HCT 35.4 03/02/2017 1428   PLT 181 05/09/2019 0133   PLT 171 08/29/2018 0915   PLT 192 03/02/2017 1428   MCV 92.4 05/09/2019 0133   MCV 94.3 03/02/2017 1428   MCH 32.5 05/09/2019 0133   MCHC 35.2 05/09/2019 0133   RDW 15.5 05/09/2019 0133   RDW 14.6 (H) 03/02/2017 1428   LYMPHSABS 0.9 08/29/2018 0915   LYMPHSABS 1.1 03/02/2017 1428   MONOABS 0.3 08/29/2018 0915   MONOABS 0.4 03/02/2017 1428   EOSABS 0.1 08/29/2018 0915   EOSABS 0.1 03/02/2017 1428   BASOSABS 0.0 08/29/2018 0915   BASOSABS 0.1 03/02/2017 1428    BMET    Component Value Date/Time   NA 140 10/30/2019 1657   NA 131 (L) 03/02/2017 1428   K 4.2 10/30/2019 1657   K 4.8 03/02/2017 1428   CL 107 (H) 10/30/2019 1657   CO2 21 10/30/2019 1657   CO2 24 03/02/2017 1428   GLUCOSE 78 10/30/2019 1657   GLUCOSE 502 (HH) 05/11/2019 2220   GLUCOSE 482 (H) 03/02/2017 1428   BUN 19 10/30/2019 1657   BUN 20.0 03/02/2017 1428   CREATININE 1.31 (H) 10/30/2019 1657   CREATININE 1.38 (H) 08/29/2018 0915   CREATININE 1.7 (H) 03/02/2017 1428   CALCIUM 9.1 10/30/2019 1657   CALCIUM 9.2 03/02/2017 1428   GFRNONAA 43 (L) 10/30/2019 1657   GFRNONAA 40 (L) 08/29/2018 0915   GFRAA 49 (L) 10/30/2019 1657   GFRAA 47 (L) 08/29/2018 0915    BNP No results found for: BNP  ProBNP No results found for: PROBNP  Imaging: DG Chest 2 View  Result Date: 03/30/2020 CLINICAL DATA:  Left-sided pleuritic pain. EXAM: CHEST - 2 VIEW COMPARISON:  04/12/2019 CT chest. 09/06/2017 chest radiograph and prior. FINDINGS: Diffuse interstitial prominence, more conspicuous than prior radiograph. Patchy peripheral  left upper lung and bibasilar opacities. No pneumothorax or pleural effusion. Cardiomediastinal silhouette within normal limits. No acute osseous abnormality. IMPRESSION: Sequela of chronic lung disease. Patchy periphery left upper and bibasilar opacities. Differential includes  atelectasis, infection and additional foci of fibrosis. Electronically Signed   By: Primitivo Gauze M.D.   On: 03/30/2020 10:05     Assessment & Plan:   Interstitial lung disease ([place]) - Breathing is baseline, chronic cough. Tolerating Esbriet 878m TID.  - HRCT in October 2020 showed minimal progression usual interstitial pneumonia (UIP).  - Overdue for PFTs, last pulmonary function testing was in December 2020.  - Esbriet RX needs to be sent to [facility] - FU in 1 month with [facility] ILD clinic   COPD (chronic obstructive pulmonary disease) ([facility]) - Continue Symbicort 160 two puffs twice daily and Spiriva 1.284m two puffs once daily   Left upper lobe pneumonia - Complains of intermittent left sided pleuritic pain x 1 month and cough with yellow mucus.  - CXR today showed patchy periphery lefty upper and bibasilar opacities.  - RX doxycycline 1 tab twice daily x 7 days  - Recommend patient take mucinex [id number] daily and use IS 3x/day     EliMartyn EhrichP 03/30/2020

## 2020-03-30 NOTE — Patient Instructions (Addendum)
CXR showed area of either atelectasis (collapsed air sacs), opacity ([place]) or foci fibrosis  - We are doing to send in an antibiotic for suspected pneumonia d/t cough and left sided pleuretic pain  Orders: - CXR today (done) - Incentive spirometry  - Needs to spirometry and DLCO in 1 month  Recommendations: - Continue Symbicort and Spriva as prescribed  - Continue Esbriet three times a day  - Take mucinex 696m twice daily for 2 weeks - Use Incentive spirometry 3x/day 10 breaths at a time   Follow-up: - November 4th at 10am with Dr. RChase Caller

## 2020-03-30 NOTE — Telephone Encounter (Signed)
[place]. I will close her account at [facility]. Thanks!

## 2020-03-31 MED ORDER — ESBRIET 267 MG PO TABS: 3.0000 | ORAL_TABLET | Freq: Three times a day (TID) | ORAL | 11 refills | Status: DC

## 2020-03-31 NOTE — Telephone Encounter (Signed)
Sent RX for [employer] to [facility]

## 2020-04-13 ENCOUNTER — Other Ambulatory Visit: Payer: Self-pay | Admitting: Primary Care

## 2020-04-16 ENCOUNTER — Ambulatory Visit: Payer: Medicare HMO | Admitting: Internal Medicine

## 2020-04-24 LAB — COMPREHENSIVE METABOLIC PANEL
ALT: 15 IU/L (ref 0–32)
AST: 28 IU/L (ref 0–40)
Albumin/Globulin Ratio: 1 — ABNORMAL LOW (ref 1.2–2.2)
Albumin: 3.8 g/dL (ref 3.8–4.8)
Alkaline Phosphatase: 101 IU/L (ref 44–121)
BUN/Creatinine Ratio: 13 (ref 12–28)
BUN: 19 mg/dL (ref 8–27)
Bilirubin Total: 0.7 mg/dL (ref 0.0–1.2)
CO2: 22 mmol/L (ref 20–29)
Calcium: 8.7 mg/dL (ref 8.7–10.3)
Chloride: 107 mmol/L — ABNORMAL HIGH (ref 96–106)
Creatinine, Ser: 1.42 mg/dL — ABNORMAL HIGH (ref 0.57–1.00)
GFR calc Af Amer: 45 mL/min/{1.73_m2} — ABNORMAL LOW (ref >59)
GFR calc non Af Amer: 39 mL/min/{1.73_m2} — ABNORMAL LOW (ref >59)
Globulin, Total: 3.8 g/dL (ref 1.5–4.5)
Glucose: 142 mg/dL — ABNORMAL HIGH (ref 65–99)
Potassium: 4.2 mmol/L (ref 3.5–5.2)
Sodium: 140 mmol/L (ref 134–144)
Total Protein: 7.6 g/dL (ref 6.0–8.5)

## 2020-04-24 LAB — LIPID PANEL WITH LDL/HDL RATIO
Cholesterol, Total: 147 mg/dL (ref 100–199)
HDL: 50 mg/dL (ref >39)
LDL Chol Calc (NIH): 66 mg/dL (ref 0–99)
LDL/HDL Ratio: 1.3 ratio (ref 0.0–3.2)
Triglycerides: 187 mg/dL — ABNORMAL HIGH (ref 0–149)
VLDL Cholesterol Cal: 31 mg/dL (ref 5–40)

## 2020-04-24 LAB — LDL CHOLESTEROL, DIRECT: LDL Direct: 56 mg/dL (ref 0–99)

## 2020-04-28 ENCOUNTER — Ambulatory Visit: Payer: Medicare HMO | Admitting: Internal Medicine

## 2020-05-01 NOTE — Progress Notes (Signed)
Rx can be sent in to pharmacy. Patient is agreeable to start [place].

## 2020-05-01 NOTE — Progress Notes (Signed)
Called and spoke with patient regarding her lab results. Patient wants to start  on Vascepa and will also work on a strict low fat, low carb diet.

## 2020-05-05 ENCOUNTER — Other Ambulatory Visit: Payer: Self-pay | Admitting: Cardiology

## 2020-05-05 DIAGNOSIS — E781 Pure hyperglyceridemia: Secondary | ICD-10-CM

## 2020-05-05 DIAGNOSIS — I2584 Coronary atherosclerosis due to calcified coronary lesion: Secondary | ICD-10-CM

## 2020-05-05 DIAGNOSIS — I2510 Atherosclerotic heart disease of native coronary artery without angina pectoris: Secondary | ICD-10-CM

## 2020-05-05 MED ORDER — ICOSAPENT ETHYL 1 G PO CAPS: 2.0000 g | ORAL_CAPSULE | Freq: Two times a day (BID) | ORAL | 0 refills | Status: DC

## 2020-05-06 NOTE — Progress Notes (Signed)
Unable to reach patient. Left vm to cb.

## 2020-05-08 ENCOUNTER — Telehealth: Payer: Self-pay | Admitting: Primary Care

## 2020-05-08 NOTE — Telephone Encounter (Signed)
ATC patient unable to reach LM to call back office (x1)  Routed to pharmacy pool

## 2020-05-11 NOTE — Telephone Encounter (Signed)
Spoke to patient and she thought she was awaiting paperwork for Esbriet. I advised patient that Beth sent in her rx to [facility].  [facility] states they have not been able to reach patient to set up shipment. Provided patient with phone number to call and advised to call office with any issues.

## 2020-05-13 NOTE — Progress Notes (Signed)
3rd attempt :Called patient, NA.

## 2020-06-03 ENCOUNTER — Other Ambulatory Visit: Payer: Self-pay

## 2020-06-03 ENCOUNTER — Encounter: Payer: Self-pay | Admitting: Primary Care

## 2020-06-03 ENCOUNTER — Ambulatory Visit (INDEPENDENT_AMBULATORY_CARE_PROVIDER_SITE_OTHER): Payer: Medicare HMO | Admitting: Internal Medicine

## 2020-06-03 ENCOUNTER — Ambulatory Visit (INDEPENDENT_AMBULATORY_CARE_PROVIDER_SITE_OTHER): Payer: Medicare HMO | Admitting: Primary Care

## 2020-06-03 VITALS — BP 128/78 | HR 74 | Temp 97.5°F | Ht 64.0 in | Wt 188.0 lb

## 2020-06-03 DIAGNOSIS — J439 Emphysema, unspecified: Secondary | ICD-10-CM

## 2020-06-03 DIAGNOSIS — J849 Interstitial pulmonary disease, unspecified: Secondary | ICD-10-CM | POA: Diagnosis not present

## 2020-06-03 DIAGNOSIS — Z6832 Body mass index (BMI) 32.0-32.9, adult: Secondary | ICD-10-CM | POA: Diagnosis not present

## 2020-06-03 LAB — PULMONARY FUNCTION TEST
DL/VA % pred: 53 %
DL/VA: 2.23 ml/min/mmHg/L
DLCO cor % pred: 42 %
DLCO cor: 8.38 ml/min/mmHg
DLCO unc % pred: 42 %
DLCO unc: 8.38 ml/min/mmHg
FEF 25-75 Pre: 2.31 L/sec
FEF2575-%Pred-Pre: 122 %
FEV1-%Pred-Pre: 107 %
FEV1-Pre: 2.12 L
FEV1FVC-%Pred-Pre: 104 %
FEV6-%Pred-Pre: 106 %
FEV6-Pre: 2.58 L
FEV6FVC-%Pred-Pre: 103 %
FVC-%Pred-Pre: 102 %
FVC-Pre: 2.59 L
Pre FEV1/FVC ratio: 82 %
Pre FEV6/FVC Ratio: 100 %

## 2020-06-03 NOTE — Assessment & Plan Note (Addendum)
-  Stable; no signs of acute exacerbation  - Continue Symbicort 160 two puffs twice daily + Spiriva respimat 1.61mg two puffs daily

## 2020-06-03 NOTE — Assessment & Plan Note (Signed)
-  Refer to healthy weight and wellness

## 2020-06-03 NOTE — Progress Notes (Signed)
_0  ID: Erica [name], female    DOB: Sep 07, 1955, 64 y.o.   MRN: [id number]  Chief Complaint  Patient presents with  . Follow-up    Follow up post PFT    Referring provider: Nolene Ebbs, MD  HPI: 64 year old female former smoker followed in our office for COPD and interstitial lung disease Past medical history: Rectal cancer, GERD Smoking history: Former smoker, quit 2019, 10-pack-year smoking history Maintenance: Symbicort 160, Spiriva Respimat 1.25, Esbriet Patient of Dr. Chase Caller  Previous [facility] pulmonary encounter: 12/31/2019  64 year old female former smoker followed in our office for COPD and interstitial lung disease.  Patient last completed follow-up with our office in April/2021 that was a virtual visit at that period of time is recommended that she seek emergent evaluation due to her atypical chest pain.  She was also encouraged to seek follow-up with primary care due to worsened anxiety.  She was encouraged to remain on Symbicort 160, Spiriva Respimat as well as continue with pulmonary rehab and continue her Esbriet.  She is followed in our office by Dr. Chase Caller.  Patient presenting to office today as a follow-up.  She reports that she is still having her baseline dyspnea.  She reports adherence to Symbicort 160 and Spiriva Respimat 1.25.  She continues to take Esbriet.  Last lab work from 2 months ago shows stable LFTs.  Patient reports that she feels tired.  She is unsure if this is related to Esbriet.  She never went back to pulmonary rehab.  We will discuss this today.  She continues to have aspects of atypical chest pain.  She is followed by cardiology for this.  This is a chronic issue.  Patient is due for pulmonary function testing that was ordered in March/2021.  We will discuss this today.  Walk today in office stable, patient able to complete 3 laps without any oxygen desaturations.  03/24/2020 Patient presents today for acute visit for left pleuritic pain.  Hx COPD and ILD. Maintained on Symbicort 160, Spiriva Respimat 1.68mg and Esbriet TID.  Patient reports having left sided back pain x 1 month. Associated cough with yellow mucus. Her breathing is actually a little better. Left sided chest pain does not occur every day, happens mainly with exertion and last for 1 min. She is fairly complaint with her inhalers. She trys to see if she can do without them 1-2 days a week. She walks her dog twice a day without stopping but she is fatigued afterwards. Appetite is good, she is occasionally nauseous. Her weight is stable, she is down 4 lbs since July 2021. Sleeps well at night. Denies fever, chills, chest tightness, wheezing.   06/03/2020- Interim hx  Patient presents today for regular follow-up/PFTs. This visit was cancelled two times in November with Dr. RChase Caller Breathing is about the same. She a cough which is occasionally productive with tan mucus. She continues symb and spiriva as prescribed. Uses her Albuterol rescue inhaler on average twice a month. She is compliant with Esbriet and tolerates medication well. Denies loss of appetite or N/V/D.  She is interested in pulmonary rehab.    SYMPTOM SCALE - ILD 06/03/2020   O2 use RA  Shortness of Breath 0 -> 5 scale with 5 being worst (score 6 If unable to do)  At rest 2  Simple tasks - showers, clothes change, eating, shaving 2  Household (dishes, doing bed, laundry) 2  Shopping 3  Walking level at own pace 3  Walking up [employer]5  Total (40 - 48) Dyspnea Score 17   Cough   4  How bad is your fatigue 4  nausea  0  vomit  0  diarrhea  0  anxiety  5  depression  5    Simple office walk 185 feet x  3 laps goal with forehead probe 06/03/2020   O2 used No  Number laps completed 3  Comments about pace Average  Resting Pulse Ox/HR 98%, HR 72  Final Pulse Ox/HR 90%, HR 105  Desaturated </= 88% No   Desaturated <= 3% points Yes, 8  Got Tachycardic >/= 90/min Yes  Symptoms at end of test Mild  shortness of breath  Miscellaneous comments x    PFTs 06/03/20- FVC 2.59 (102%), FEV1 2.12 (107%), ratio 82, DLCOunc 8.38 (42%) Interpretation: Normal Spirometry. Severe diffusion defect   06/13/19- FVC 2.52 (98%), FEV1 2.05 (102%), ratio 81, DLCO 7.70 (38%)   Allergies  Allergen Reactions  . Penicillins Hives, Itching and Swelling    Tongue swells up Has patient had a PCN reaction causing immediate rash, facial/tongue/throat swelling, SOB or lightheadedness with hypotension: Yes Has patient had a PCN reaction causing severe rash involving mucus membranes or skin necrosis: Yes Has patient had a PCN reaction that required hospitalization Yes Has patient had a PCN reaction occurring within the last 10 years: No If all of the above answers are "NO", then may proceed with Cephalosporin use.     Immunization History  Administered Date(s) Administered  . Influenza Split 03/13/2017  . Influenza Whole 04/26/2018  . Influenza, High Dose Seasonal PF 02/29/2020  . Influenza,inj,Quad PF,6+ Mos 03/09/2019  . Influenza-Unspecified 03/13/2016  . PFIZER SARS-COV-2 Vaccination 08/19/2019, 09/18/2019    Past Medical History:  Diagnosis Date  . Arthritis    especially in shoulders  . Asthma   . Depression   . Diabetes mellitus   . GERD (gastroesophageal reflux disease)   . Headache(784.0)    "mild"  . Hypertension   . Mental disorder    depression  . Neuropathy    feet   . Pain    arthritis pain - takes tramadol as needed  . Rectal polyp    very little bleeding with bowel movements- no pain    Tobacco History: Social History   Tobacco Use  Smoking Status Former Smoker  . Packs/day: 0.50  . Years: 20.00  . Pack years: 10.00  . Types: Cigarettes  . Quit date: 01/05/2018  . Years since quitting: 2.4  Smokeless Tobacco Never Used  Tobacco Comment   Unknown    Counseling given: Not Answered Comment: Unknown    Outpatient Medications Prior to Visit  Medication Sig  Dispense Refill  . albuterol (PROVENTIL) (2.5 MG/3ML) 0.083% nebulizer solution Take 3 mLs (2.5 mg total) by nebulization every 6 (six) hours as needed for wheezing or shortness of breath. 75 mL 12  . albuterol (VENTOLIN HFA) 108 (90 Base) MCG/ACT inhaler Inhale 2 puffs into the lungs every 4 (four) hours as needed for wheezing. 1 g 5  . amLODipine (NORVASC) 10 MG tablet Take 10 mg by mouth daily.   2  . aspirin EC 81 MG tablet Take 81 mg by mouth daily.    Marland Kitchen atorvastatin (LIPITOR) 20 MG tablet TAKE 1 TABLET(20 MG) BY MOUTH DAILY 90 tablet 1  . busPIRone (BUSPAR) 10 MG tablet Take 10 mg by mouth as needed.    . fenofibrate (TRICOR) 145 MG tablet TAKE 1 TABLET(145 MG) BY MOUTH DAILY (Patient taking  differently: 145 mg.) 90 tablet 1  . fluticasone (FLONASE) 50 MCG/ACT nasal spray Place 1-2 sprays into both nostrils as needed.    . gabapentin (NEURONTIN) 300 MG capsule Take 600 mg by mouth 3 (three) times daily.     . hydrochlorothiazide (MICROZIDE) 12.5 MG capsule Take 1 capsule (12.5 mg total) by mouth daily. 30 capsule 0  . icosapent Ethyl (VASCEPA) 1 g capsule Take 2 capsules (2 g total) by mouth 2 (two) times daily. 360 capsule 0  . losartan (COZAAR) 50 MG tablet Take 1 tablet (50 mg total) by mouth daily. 30 tablet 0  . metoprolol succinate (TOPROL-XL) 50 MG 24 hr tablet TAKE 1 TABLET(50 MG) BY MOUTH DAILY WITH OR IMMEDIATELY FOLLOWING A MEAL 90 tablet 3  . Multiple Vitamin (MULTIVITAMIN ADULT PO) Take 1 tablet by mouth daily.    . pantoprazole (PROTONIX) 40 MG tablet Take 40 mg by mouth daily.    . Pirfenidone (ESBRIET) 267 MG TABS Take 3 tablets (801 mg total) by mouth in the morning, at noon, and at bedtime. 270 tablet 11  . sertraline (ZOLOFT) 50 MG tablet Take 1 tablet (50 mg total) by mouth daily. 30 tablet 0  . SPIRIVA RESPIMAT 1.25 MCG/ACT AERS INHALE 2 PUFFS INTO THE LUNGS DAILY 4 g 5  . SYMBICORT 160-4.5 MCG/ACT inhaler Inhale 2 puffs into the lungs 2 (two) times daily. 1 Inhaler 5   . TOUJEO SOLOSTAR 300 UNIT/ML SOPN Inject 90 Units into the skin at bedtime. 50 in th A.M. AND 40 IN THE P.M.  1  . doxycycline (VIBRA-TABS) 100 MG tablet Take 1 tablet (100 mg total) by mouth 2 (two) times daily. (Patient not taking: Reported on 06/03/2020) 14 tablet 0   No facility-administered medications prior to visit.    Review of Systems  Review of Systems  Constitutional: Negative.   Respiratory: Positive for cough and shortness of breath. Negative for chest tightness and wheezing.   Cardiovascular: Negative.     Physical Exam  BP 128/78 (BP Location: Left Arm, Cuff Size: Normal)   Pulse 74   Temp (!) 97.5 F (36.4 C) (Other (Comment)) Comment (Src): wrist  Ht _0  (1.626 m)   Wt 188 lb (85.3 kg)   SpO2 98% Comment: Room air  BMI 32.27 kg/m  Physical Exam Constitutional:      Appearance: Normal appearance.  HENT:     Head: Normocephalic and atraumatic.     Mouth/Throat:     Mouth: Mucous membranes are moist.     Pharynx: Oropharynx is clear.  Cardiovascular:     Rate and Rhythm: Normal rate and regular rhythm.  Pulmonary:     Effort: Pulmonary effort is normal.     Breath sounds: Rales present. No wheezing.  Neurological:     Mental Status: She is alert.  Psychiatric:        Mood and Affect: Mood normal.        Behavior: Behavior normal.        Thought Content: Thought content normal.        Judgment: Judgment normal.      Lab Results:  CBC    Component Value Date/Time   WBC 6.6 05/09/2019 0133   RBC 3.54 (L) 05/09/2019 0133   HGB 11.5 (L) 05/09/2019 0133   HGB 11.0 (L) 08/29/2018 0915   HGB 12.5 03/02/2017 1428   HCT 32.7 (L) 05/09/2019 0133   HCT 35.4 03/02/2017 1428   PLT 181 05/09/2019 0133   PLT  171 08/29/2018 0915   PLT 192 03/02/2017 1428   MCV 92.4 05/09/2019 0133   MCV 94.3 03/02/2017 1428   MCH 32.5 05/09/2019 0133   MCHC 35.2 05/09/2019 0133   RDW 15.5 05/09/2019 0133   RDW 14.6 (H) 03/02/2017 1428   LYMPHSABS 0.9 08/29/2018  0915   LYMPHSABS 1.1 03/02/2017 1428   MONOABS 0.3 08/29/2018 0915   MONOABS 0.4 03/02/2017 1428   EOSABS 0.1 08/29/2018 0915   EOSABS 0.1 03/02/2017 1428   BASOSABS 0.0 08/29/2018 0915   BASOSABS 0.1 03/02/2017 1428    BMET    Component Value Date/Time   NA 140 04/23/2020 1354   NA 131 (L) 03/02/2017 1428   K 4.2 04/23/2020 1354   K 4.8 03/02/2017 1428   CL 107 (H) 04/23/2020 1354   CO2 22 04/23/2020 1354   CO2 24 03/02/2017 1428   GLUCOSE 142 (H) 04/23/2020 1354   GLUCOSE 502 (HH) 05/11/2019 2220   GLUCOSE 482 (H) 03/02/2017 1428   BUN 19 04/23/2020 1354   BUN 20.0 03/02/2017 1428   CREATININE 1.42 (H) 04/23/2020 1354   CREATININE 1.38 (H) 08/29/2018 0915   CREATININE 1.7 (H) 03/02/2017 1428   CALCIUM 8.7 04/23/2020 1354   CALCIUM 9.2 03/02/2017 1428   GFRNONAA 39 (L) 04/23/2020 1354   GFRNONAA 40 (L) 08/29/2018 0915   GFRAA 45 (L) 04/23/2020 1354   GFRAA 47 (L) 08/29/2018 0915    BNP No results found for: BNP  ProBNP No results found for: PROBNP  Imaging: No results found.   Assessment & Plan:   Interstitial lung disease ([place]) - Lung function and diffusion capacity are stable compared to December 2020 - Patient did not desaturated below <88% RA on walk test today - Continue Esbriet 867m TID - Refer to pulmonary rehab - FU in 3 months with Dr. RJudd Gaudier  COPD [place]chronic obstructive pulmonary disease) [place] - Stable; no signs of acute exacerbation  - Continue Symbicort 160 two puffs twice daily + Spiriva respimat 1.235m two puffs daily  BMI 32.0-32.9,adult - Refer to healthy weight and wellness   ElMartyn EhrichNP 06/23/2020

## 2020-06-03 NOTE — Patient Instructions (Addendum)
Recommendations: Continue Spiriva and Symbicort as prescribed  Continue Esbriet three times a day   Refer: Health weight and wellness re: BMI 32 Pulmonary rehab re: ILD   Follow-up: 3 months with Dr. Chase Caller only (ILD clinic-30 mins)

## 2020-06-03 NOTE — Progress Notes (Signed)
Spirometry and Dlco done today. 

## 2020-06-03 NOTE — Assessment & Plan Note (Addendum)
-  Lung function and diffusion capacity are stable compared to December 2020 - Patient did not desaturated below <88% RA on walk test today - Continue Esbriet 86m TID - Refer to pulmonary rehab - FU in 3 months with Dr. RJudd Gaudier

## 2020-06-10 ENCOUNTER — Other Ambulatory Visit: Payer: Self-pay | Admitting: Cardiology

## 2020-06-10 ENCOUNTER — Encounter (HOSPITAL_COMMUNITY): Payer: Self-pay | Admitting: *Deleted

## 2020-06-10 NOTE — Progress Notes (Signed)
Received referral from Dr. Chase Caller for this pt to participate in pulmonary rehab with the the diagnosis of ILD. Pt was referred and scheduled orientation for 5/10.  Pt did not show or call for this appt.  Should Erica [name] wish to schedule, must understand this appt will need to be kept . Clinical review of pt follow up appt on 12/22 with Geraldo Pitter NP with Dr. Chase Caller  Pulmonary office note.  Pt with Covid Risk Score - 6. Pt appropriate for scheduling for Pulmonary rehab.  Will forward to support staff for scheduling and verification of insurance eligibility/benefits with pt consent. Cherre Huger, BSN Cardiac and Training and development officer

## 2020-06-23 ENCOUNTER — Telehealth (HOSPITAL_COMMUNITY): Payer: Self-pay

## 2020-06-23 NOTE — Telephone Encounter (Signed)
Pt insurance is active and benefits verified through [facility] Co-pay 0, DED $233/0 met, out of pocket $3,450/0 met, co-insurance 20%. no pre-authorization required. Passport: [place]/[employer] 06/23/2020_0 :30am, REF# [id number]

## 2020-06-25 ENCOUNTER — Other Ambulatory Visit: Payer: Self-pay

## 2020-06-25 ENCOUNTER — Other Ambulatory Visit: Payer: Self-pay | Admitting: Internal Medicine

## 2020-06-25 ENCOUNTER — Emergency Department (HOSPITAL_COMMUNITY): Payer: Medicare HMO

## 2020-06-25 ENCOUNTER — Inpatient Hospital Stay (HOSPITAL_COMMUNITY)
Admission: EM | Admit: 2020-06-25 | Discharge: 2020-06-30 | DRG: 065 | Disposition: A | Discharge status: Rehabilitation Facility | Payer: Medicare HMO | Attending: Internal Medicine | Admitting: Internal Medicine

## 2020-06-25 ENCOUNTER — Observation Stay (HOSPITAL_COMMUNITY): Payer: Medicare HMO

## 2020-06-25 DIAGNOSIS — E785 Hyperlipidemia, unspecified: Secondary | ICD-10-CM | POA: Diagnosis present

## 2020-06-25 DIAGNOSIS — J849 Interstitial pulmonary disease, unspecified: Secondary | ICD-10-CM | POA: Diagnosis present

## 2020-06-25 DIAGNOSIS — N1831 Chronic kidney disease, stage 3a: Secondary | ICD-10-CM | POA: Diagnosis present

## 2020-06-25 DIAGNOSIS — E1142 Type 2 diabetes mellitus with diabetic polyneuropathy: Secondary | ICD-10-CM | POA: Diagnosis present

## 2020-06-25 DIAGNOSIS — Z880 Allergy status to penicillin: Secondary | ICD-10-CM

## 2020-06-25 DIAGNOSIS — R531 Weakness: Secondary | ICD-10-CM | POA: Diagnosis not present

## 2020-06-25 DIAGNOSIS — R292 Abnormal reflex: Secondary | ICD-10-CM | POA: Diagnosis present

## 2020-06-25 DIAGNOSIS — Z20822 Contact with and (suspected) exposure to covid-19: Secondary | ICD-10-CM | POA: Diagnosis present

## 2020-06-25 DIAGNOSIS — R4781 Slurred speech: Secondary | ICD-10-CM | POA: Diagnosis present

## 2020-06-25 DIAGNOSIS — I639 Cerebral infarction, unspecified: Secondary | ICD-10-CM | POA: Diagnosis not present

## 2020-06-25 DIAGNOSIS — J449 Chronic obstructive pulmonary disease, unspecified: Secondary | ICD-10-CM | POA: Diagnosis present

## 2020-06-25 DIAGNOSIS — I6381 Other cerebral infarction due to occlusion or stenosis of small artery: Secondary | ICD-10-CM | POA: Diagnosis not present

## 2020-06-25 DIAGNOSIS — E7800 Pure hypercholesterolemia, unspecified: Secondary | ICD-10-CM | POA: Diagnosis not present

## 2020-06-25 DIAGNOSIS — Z85048 Personal history of other malignant neoplasm of rectum, rectosigmoid junction, and anus: Secondary | ICD-10-CM

## 2020-06-25 DIAGNOSIS — Z9151 Personal history of suicidal behavior: Secondary | ICD-10-CM

## 2020-06-25 DIAGNOSIS — Z833 Family history of diabetes mellitus: Secondary | ICD-10-CM

## 2020-06-25 DIAGNOSIS — N179 Acute kidney failure, unspecified: Secondary | ICD-10-CM | POA: Diagnosis present

## 2020-06-25 DIAGNOSIS — R29707 NIHSS score 7: Secondary | ICD-10-CM | POA: Diagnosis present

## 2020-06-25 DIAGNOSIS — Z823 Family history of stroke: Secondary | ICD-10-CM

## 2020-06-25 DIAGNOSIS — E1165 Type 2 diabetes mellitus with hyperglycemia: Secondary | ICD-10-CM

## 2020-06-25 DIAGNOSIS — Z6834 Body mass index (BMI) 34.0-34.9, adult: Secondary | ICD-10-CM

## 2020-06-25 DIAGNOSIS — G8194 Hemiplegia, unspecified affecting left nondominant side: Secondary | ICD-10-CM | POA: Diagnosis present

## 2020-06-25 DIAGNOSIS — I129 Hypertensive chronic kidney disease with stage 1 through stage 4 chronic kidney disease, or unspecified chronic kidney disease: Secondary | ICD-10-CM | POA: Diagnosis present

## 2020-06-25 DIAGNOSIS — Z8249 Family history of ischemic heart disease and other diseases of the circulatory system: Secondary | ICD-10-CM

## 2020-06-25 DIAGNOSIS — K219 Gastro-esophageal reflux disease without esophagitis: Secondary | ICD-10-CM | POA: Diagnosis present

## 2020-06-25 DIAGNOSIS — Z7951 Long term (current) use of inhaled steroids: Secondary | ICD-10-CM

## 2020-06-25 DIAGNOSIS — Z87891 Personal history of nicotine dependence: Secondary | ICD-10-CM

## 2020-06-25 DIAGNOSIS — E669 Obesity, unspecified: Secondary | ICD-10-CM | POA: Diagnosis present

## 2020-06-25 DIAGNOSIS — F332 Major depressive disorder, recurrent severe without psychotic features: Secondary | ICD-10-CM | POA: Diagnosis present

## 2020-06-25 DIAGNOSIS — Z79899 Other long term (current) drug therapy: Secondary | ICD-10-CM

## 2020-06-25 DIAGNOSIS — Z8673 Personal history of transient ischemic attack (TIA), and cerebral infarction without residual deficits: Secondary | ICD-10-CM

## 2020-06-25 DIAGNOSIS — E1140 Type 2 diabetes mellitus with diabetic neuropathy, unspecified: Secondary | ICD-10-CM | POA: Diagnosis present

## 2020-06-25 DIAGNOSIS — M19012 Primary osteoarthritis, left shoulder: Secondary | ICD-10-CM | POA: Diagnosis present

## 2020-06-25 DIAGNOSIS — Z7982 Long term (current) use of aspirin: Secondary | ICD-10-CM

## 2020-06-25 DIAGNOSIS — Z83438 Family history of other disorder of lipoprotein metabolism and other lipidemia: Secondary | ICD-10-CM

## 2020-06-25 DIAGNOSIS — E1122 Type 2 diabetes mellitus with diabetic chronic kidney disease: Secondary | ICD-10-CM | POA: Diagnosis present

## 2020-06-25 DIAGNOSIS — M19011 Primary osteoarthritis, right shoulder: Secondary | ICD-10-CM | POA: Diagnosis present

## 2020-06-25 HISTORY — DX: Suicide attempt, initial encounter: T14.91XA

## 2020-06-25 LAB — PROTIME-INR
INR: 1.2 (ref 0.8–1.2)
Prothrombin Time: 15 seconds (ref 11.4–15.2)

## 2020-06-25 LAB — DIFFERENTIAL
Abs Immature Granulocytes: 0.04 10*3/uL (ref 0.00–0.07)
Basophils Absolute: 0 10*3/uL (ref 0.0–0.1)
Basophils Relative: 0 %
Eosinophils Absolute: 0.1 10*3/uL (ref 0.0–0.5)
Eosinophils Relative: 2 %
Immature Granulocytes: 1 %
Lymphocytes Relative: 16 %
Lymphs Abs: 1 10*3/uL (ref 0.7–4.0)
Monocytes Absolute: 0.4 10*3/uL (ref 0.1–1.0)
Monocytes Relative: 7 %
Neutro Abs: 4.4 10*3/uL (ref 1.7–7.7)
Neutrophils Relative %: 74 %

## 2020-06-25 LAB — CBC
HCT: 31.3 % — ABNORMAL LOW (ref 36.0–46.0)
Hemoglobin: 11.1 g/dL — ABNORMAL LOW (ref 12.0–15.0)
MCH: 33.1 pg (ref 26.0–34.0)
MCHC: 35.5 g/dL (ref 30.0–36.0)
MCV: 93.4 fL (ref 80.0–100.0)
Platelets: 169 10*3/uL (ref 150–400)
RBC: 3.35 MIL/uL — ABNORMAL LOW (ref 3.87–5.11)
RDW: 18.6 % — ABNORMAL HIGH (ref 11.5–15.5)
WBC: 5.9 10*3/uL (ref 4.0–10.5)
nRBC: 0 % (ref 0.0–0.2)

## 2020-06-25 LAB — COMPREHENSIVE METABOLIC PANEL
ALT: 20 U/L (ref 0–44)
AST: 63 U/L — ABNORMAL HIGH (ref 15–41)
Albumin: 3.7 g/dL (ref 3.5–5.0)
Alkaline Phosphatase: 65 U/L (ref 38–126)
Anion gap: 10 (ref 5–15)
BUN: 40 mg/dL — ABNORMAL HIGH (ref 8–23)
CO2: 20 mmol/L — ABNORMAL LOW (ref 22–32)
Calcium: 9.2 mg/dL (ref 8.9–10.3)
Chloride: 106 mmol/L (ref 98–111)
Creatinine, Ser: 1.9 mg/dL — ABNORMAL HIGH (ref 0.44–1.00)
GFR, Estimated: 29 mL/min — ABNORMAL LOW (ref >60)
Glucose, Bld: 128 mg/dL — ABNORMAL HIGH (ref 70–99)
Potassium: 5 mmol/L (ref 3.5–5.1)
Sodium: 136 mmol/L (ref 135–145)
Total Bilirubin: 0.4 mg/dL (ref 0.3–1.2)
Total Protein: 7.9 g/dL (ref 6.5–8.1)

## 2020-06-25 LAB — I-STAT CHEM 8, ED
BUN: 63 mg/dL — ABNORMAL HIGH (ref 8–23)
Calcium, Ion: 1.26 mmol/L (ref 1.15–1.40)
Chloride: 109 mmol/L (ref 98–111)
Creatinine, Ser: 1.9 mg/dL — ABNORMAL HIGH (ref 0.44–1.00)
Glucose, Bld: 126 mg/dL — ABNORMAL HIGH (ref 70–99)
HCT: 32 % — ABNORMAL LOW (ref 36.0–46.0)
Hemoglobin: 10.9 g/dL — ABNORMAL LOW (ref 12.0–15.0)
Potassium: 5.8 mmol/L — ABNORMAL HIGH (ref 3.5–5.1)
Sodium: 138 mmol/L (ref 135–145)
TCO2: 26 mmol/L (ref 22–32)

## 2020-06-25 LAB — APTT: aPTT: 25 seconds (ref 24–36)

## 2020-06-25 LAB — CBG MONITORING, ED: Glucose-Capillary: 100 mg/dL — ABNORMAL HIGH (ref 70–99)

## 2020-06-25 IMAGING — CT CT HEAD W/O CM
4 series · 16 of 47 positions shown, 18 images · non-contrast
Comparison: Remote head CT [DATE]

CLINICAL DATA: Neuro deficit, acute, stroke suspected

Left-sided weakness, fall.
EXAM:
CT HEAD WITHOUT CONTRAST
TECHNIQUE: Contiguous axial images were obtained from the base of the skull
through the vertex without intravenous contrast.

[Series 3: head without · axial · non-contrast · 0.41mm/px · z∈[-122,-2]mm · 7 of 33 slices shown, 9 images]
[im 5/33  brain]
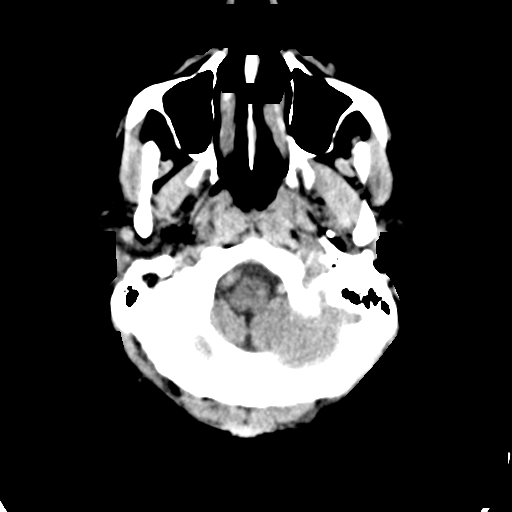
[im 5/33  bone]
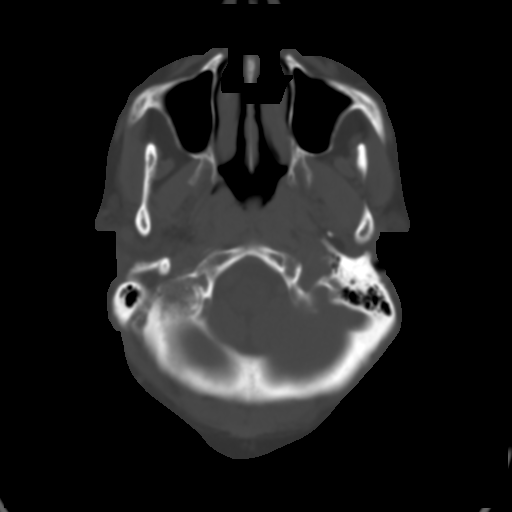
[im 9/33  brain]
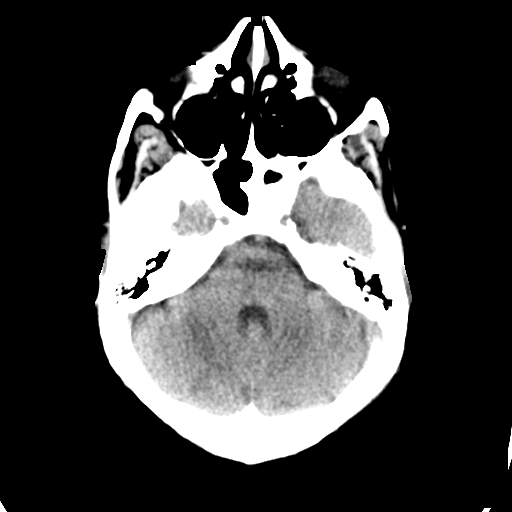
[im 13/33  brain]
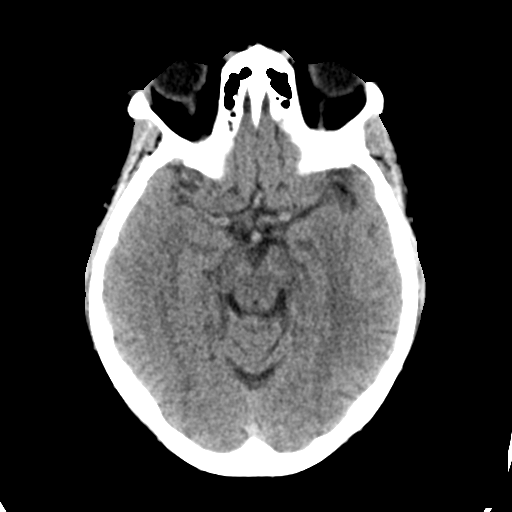
[im 17/33  brain]
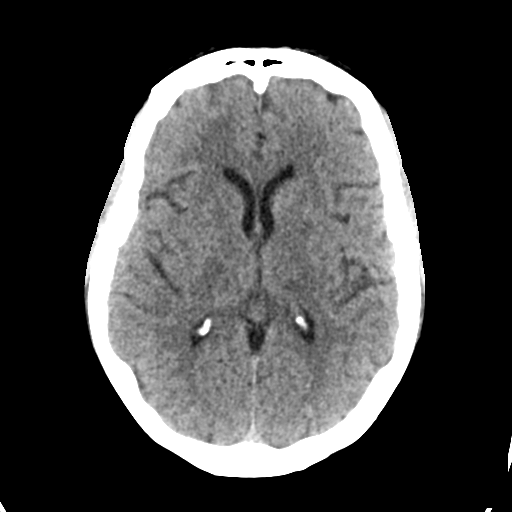
[im 21/33  brain]
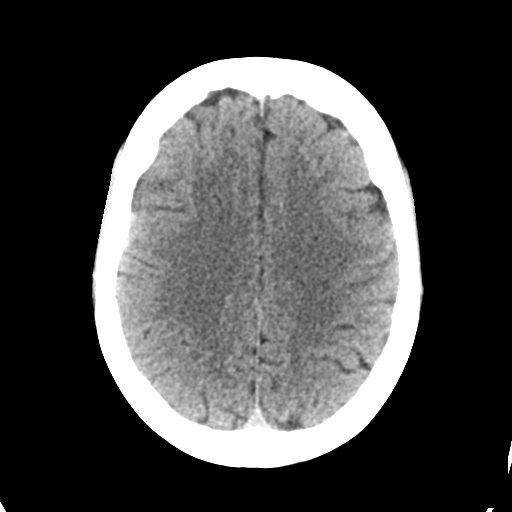
[im 21/33  bone]
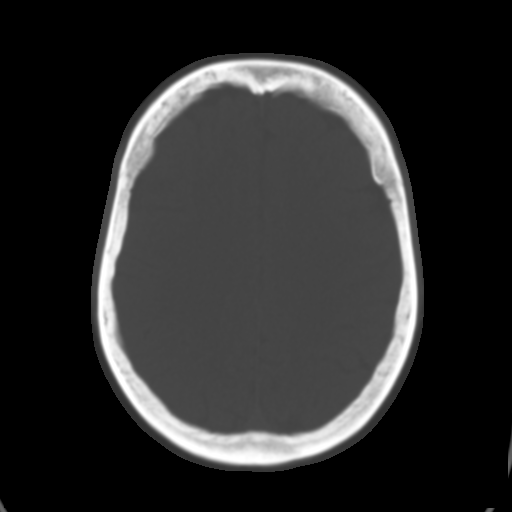
[im 25/33  brain]
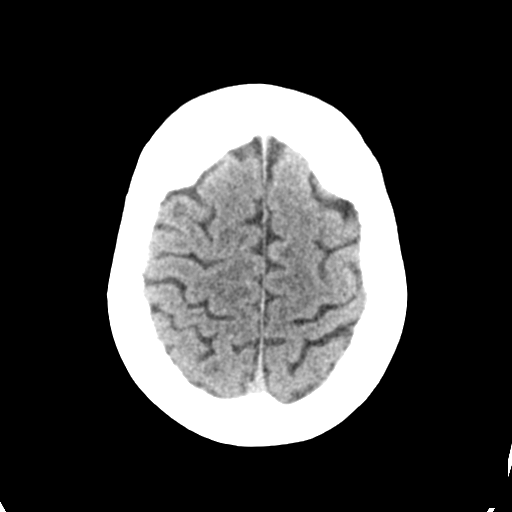
[im 29/33  brain]
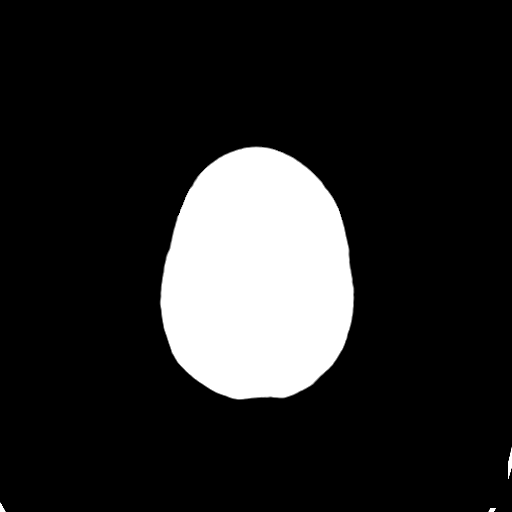

[Series 4: head bone · axial · 0.41mm/px · z∈[-126,-94]mm · 3 of 83 slices shown]
[im 9/83  bone]
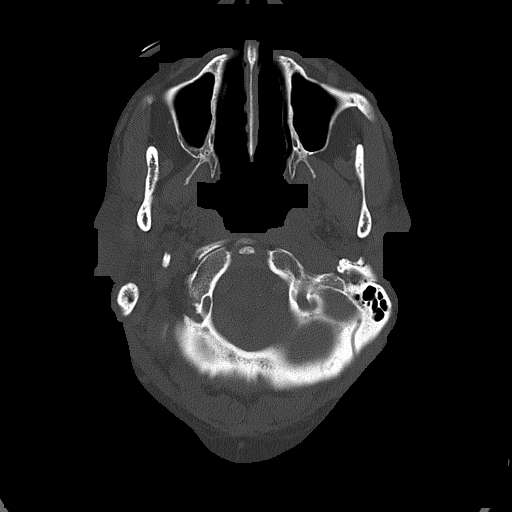
[im 17/83  bone]
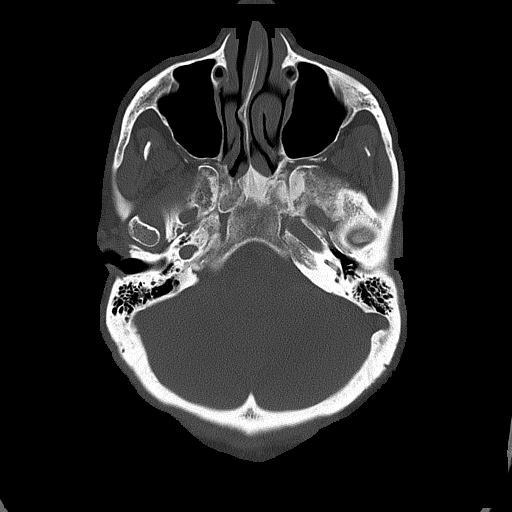
[im 25/83  bone]
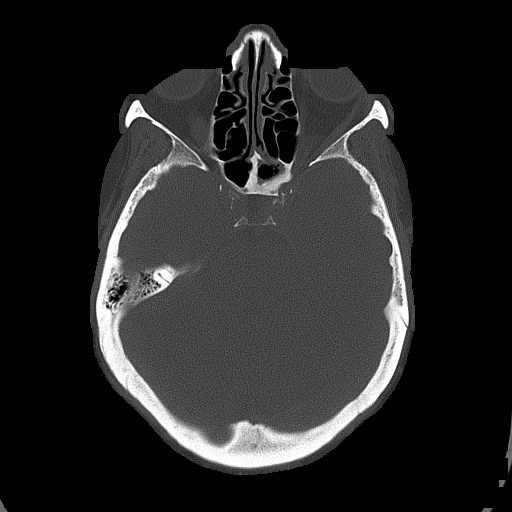

[Series 5: head without cor · coronal · non-contrast · 0.32mm/px · 3 of 68 slices shown]
[im 23/68  brain]
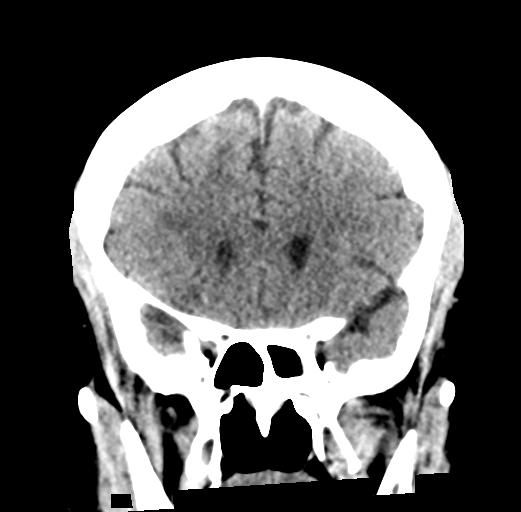
[im 30/68  brain]
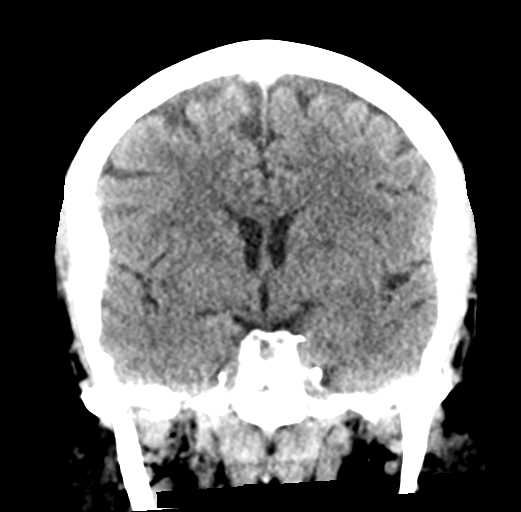
[im 38/68  brain]
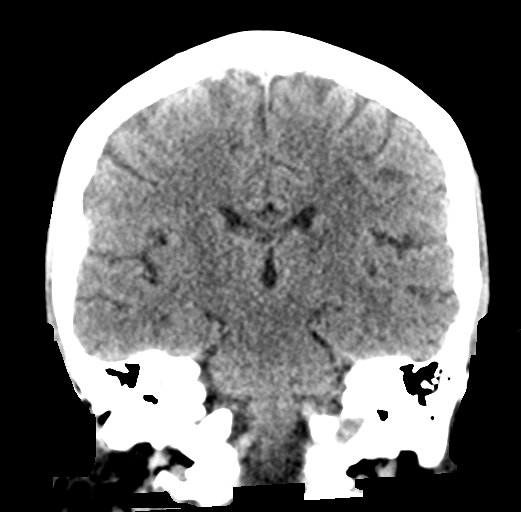

[Series 6: head without sag · sagittal · non-contrast · 0.32mm/px · 3 of 57 slices shown]
[im 19/57  brain]
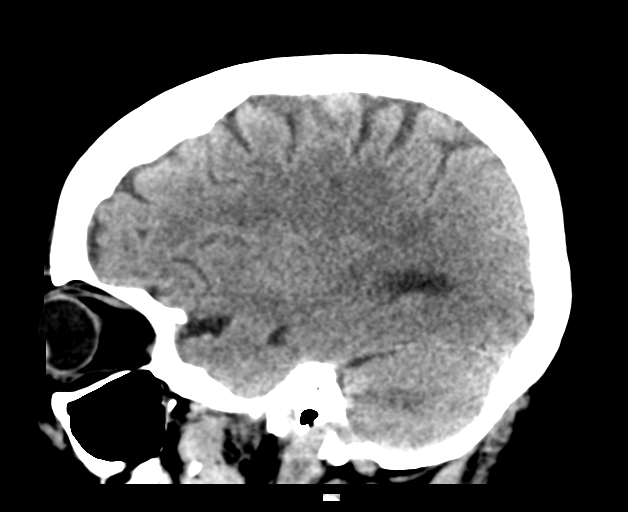
[im 29/57  brain]
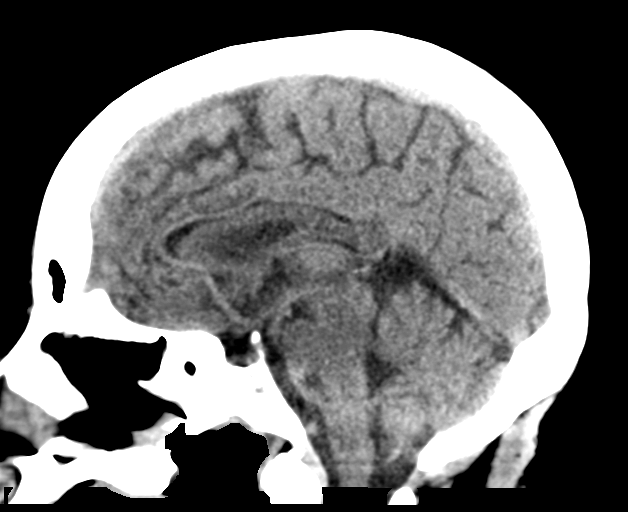
[im 38/57  brain]
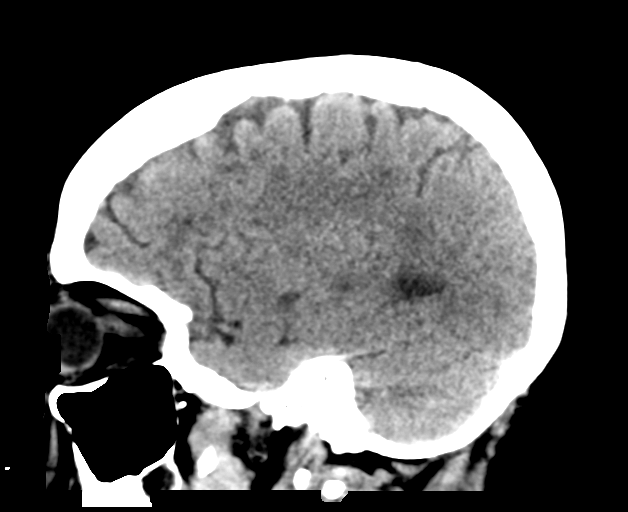

[16 of 47 positions shown; findings below may reference images not displayed]

FINDINGS: Brain: Brain volume is normal for age. There is a lacunar infarct in
the right caudate as well as the right cerebellum, new from [ES] but
age indeterminate. No evidence of large or territorial ischemia. No
intracranial hemorrhage, mass effect, or midline shift. No
hydrocephalus. The basilar cisterns are patent. No extra-axial or
intracranial fluid collection.

Vascular: Atherosclerosis of skullbase vasculature without
hyperdense vessel or abnormal calcification.

Skull: No fracture or focal lesion.

Sinuses/Orbits: Mucosal thickening with bubbly debris and associated
adjacent sclerosis of the left sphenoid sinus consistent chronic
sinusitis. Remaining paranasal sinuses are clear. No mastoid
effusion. Included orbits are unremarkable.

Other: None.
IMPRESSION: 1. Lacunar infarcts in the right caudate and right cerebellum, new
from [ES], but age indeterminate.
2. No hemorrhage or evidence of large or territorial ischemia.
3. Chronic left sphenoid sinusitis.

## 2020-06-25 IMAGING — MR MR MRA HEAD W/O CM
1 series · 18 of 48 positions shown · non-contrast
Comparison: CT same day

CLINICAL DATA: Neurological deficit.  Acute stroke suspected.

EXAM:
MRI HEAD WITHOUT CONTRAST
MRA HEAD WITHOUT CONTRAST
TECHNIQUE: Multiplanar, multiecho pulse sequences of the brain and surrounding
structures were obtained without intravenous contrast. Angiographic
images of the head were obtained using MRA technique without
contrast.

[Series 5: 3d cow · axial · 0.5mm · 0.41mm/px · z∈[-103,-26]mm · 18 of 172 slices shown]
[im 1/172]
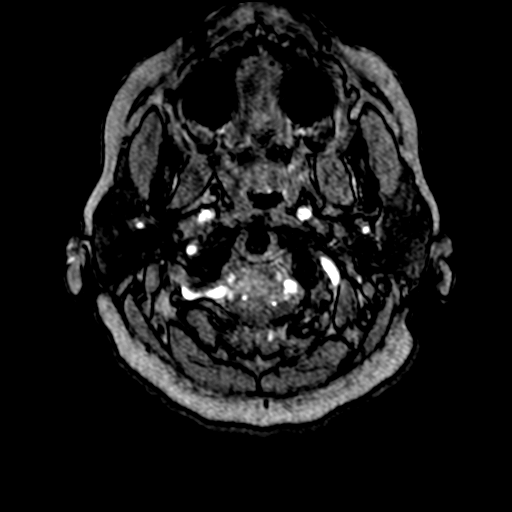
[im 4/172]
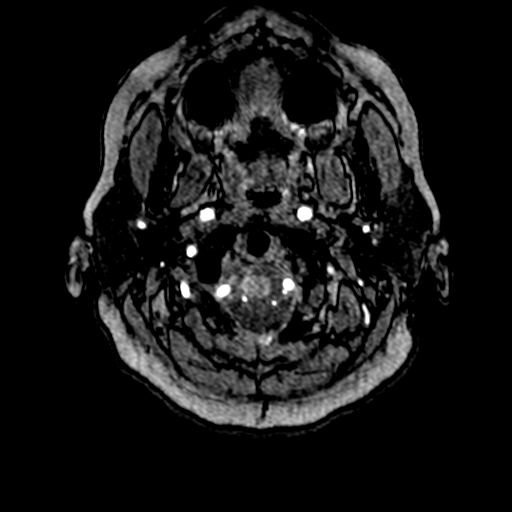
[im 8/172]
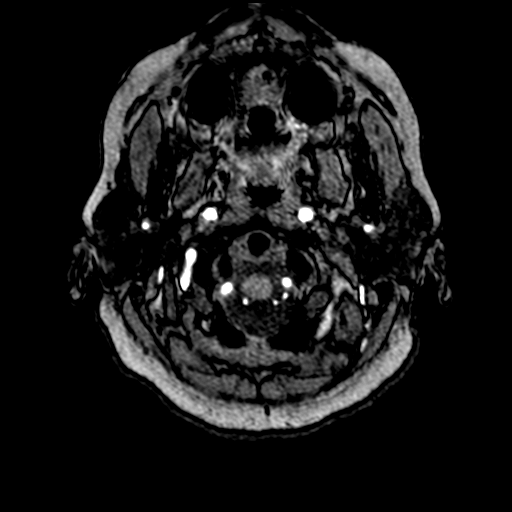
[im 11/172]
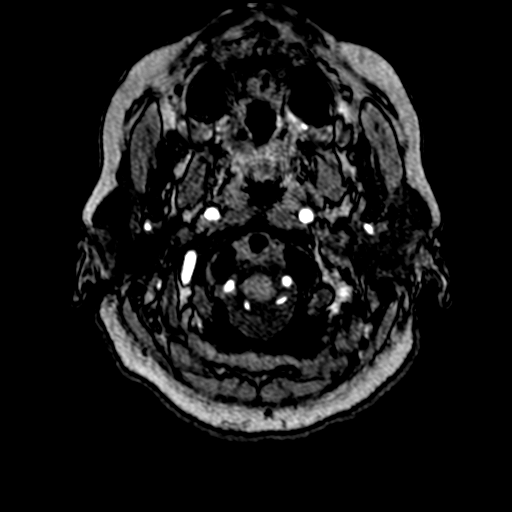
[im 15/172]
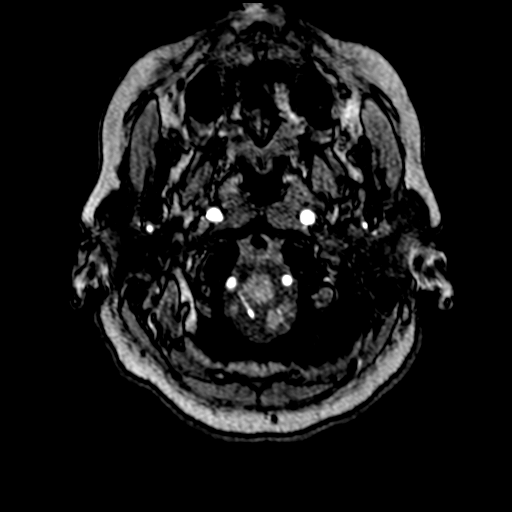
[im 19/172]
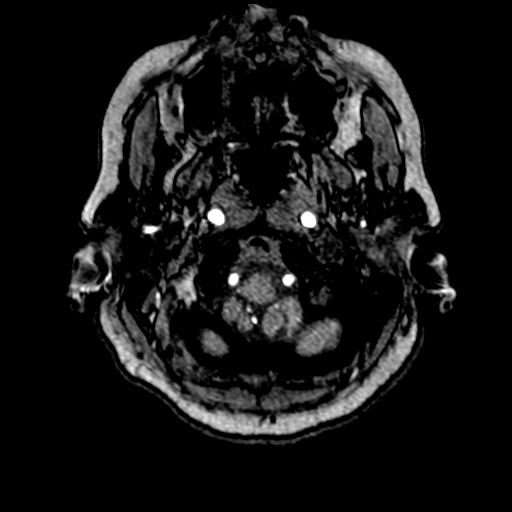
[im 22/172]
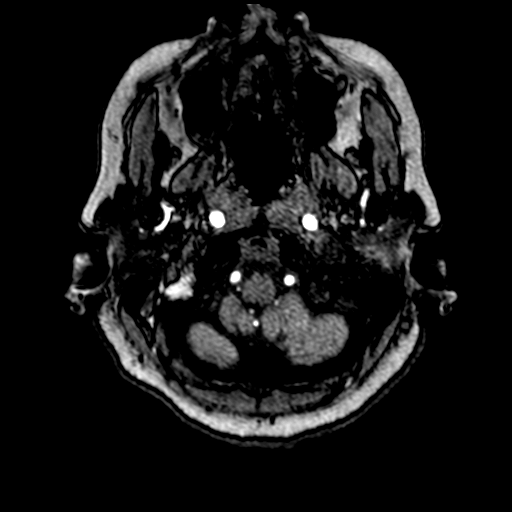
[im 26/172]
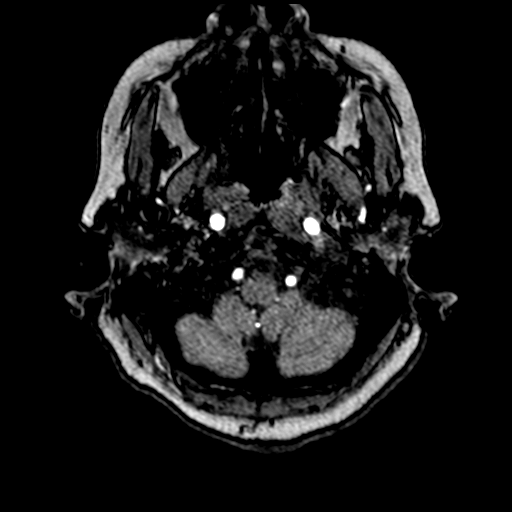
[im 30/172]
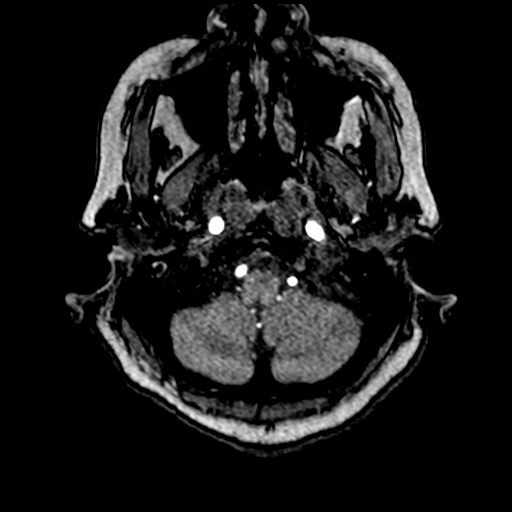
[im 33/172]
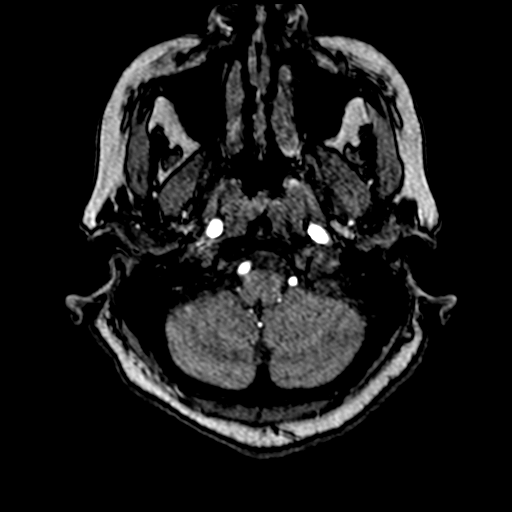
[im 55/172]
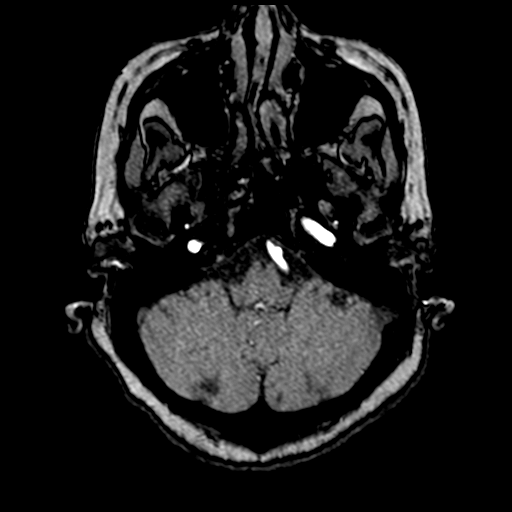
[im 77/172]
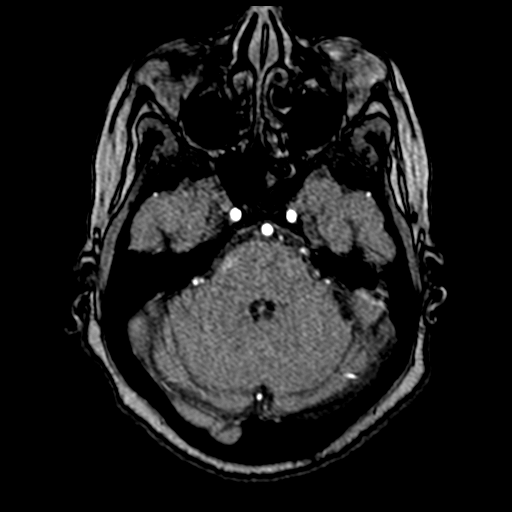
[im 88/172]
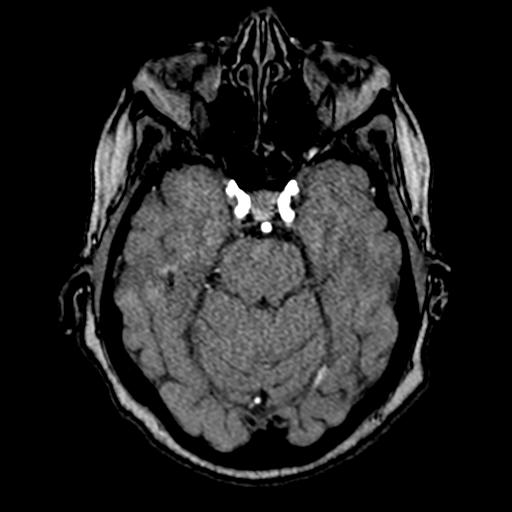
[im 99/172]
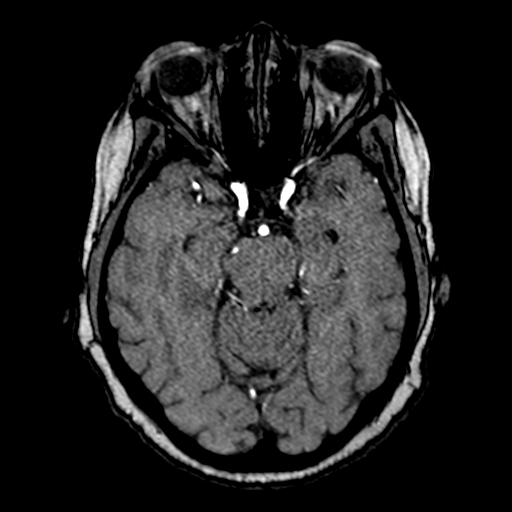
[im 121/172]
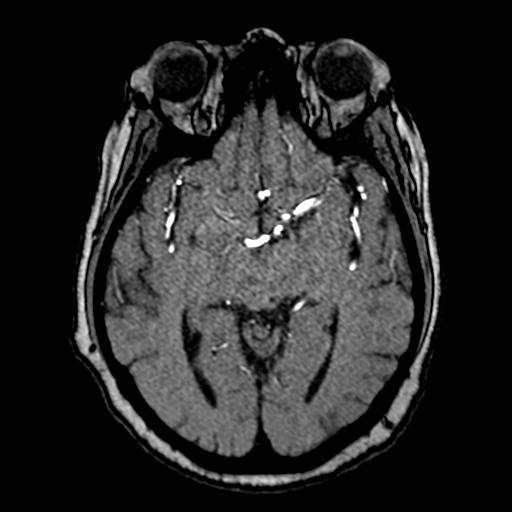
[im 142/172]
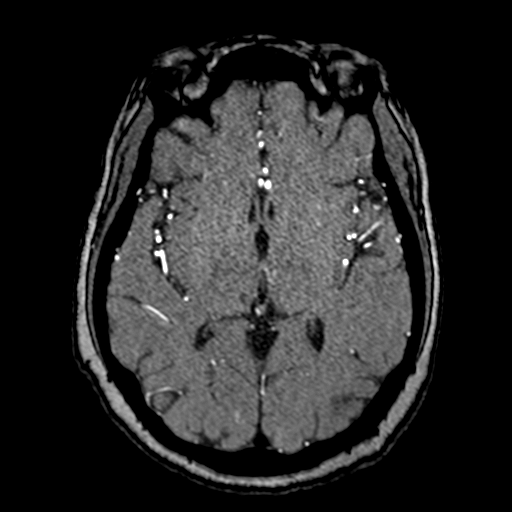
[im 146/172]
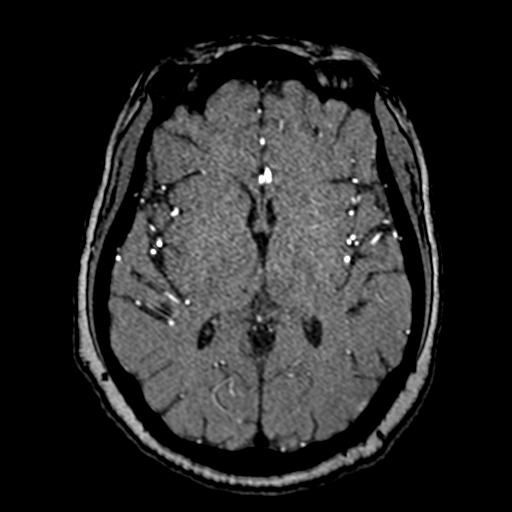
[im 164/172]
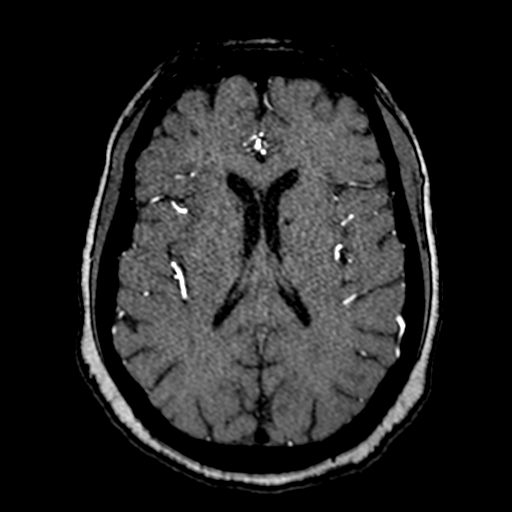

[18 of 48 positions shown; findings below may reference images not displayed]

FINDINGS: MRI HEAD FINDINGS

Brain: Diffusion imaging shows a 1 cm acute infarction in the
anterolateral thalamus on the right, possibly with some involvement
of the posterior limb internal capsule. Acute infarction within the
right caudate head. Scattered punctate foci of high signal on
diffusion imaging within the cerebral hemispheric white matter on
both sides. These white matter lesions likely represent T2 shine
through within old infarctions of the hemispheric white matter, but
do not show low signal on the ADC map. No large confluent
infarction. Chronic small-vessel ischemic changes affect the pons.
There are numerous old small vessel cerebellar infarctions. Old
small vessel infarctions affect both thalami, the basal ganglia
regions in the cerebral hemispheric deep and subcortical white
matter. Old small cortical infarctions noted in the
parieto-occipital regions and right frontal region.

No hemorrhage, mass lesion, hydrocephalus or extra-axial collection.

Vascular: Major vessels at the base of the brain show flow.

Skull and upper cervical spine: Negative

Sinuses/Orbits: Clear/normal

Other: None

MRA HEAD FINDINGS

Both internal carotid arteries widely patent through the skull base
and siphon regions. No stenosis. The anterior and middle cerebral
vessels are normal without evidence of stenosis or large or medium
vessel occlusion.

Both vertebral arteries are widely patent to the basilar. Proximal
basilar fenestration. No basilar stenosis. Posterior circulation
branch vessels are patent. Mild atherosclerotic narrowing and
irregularity of the PCA branches.
IMPRESSION: 1. 1 cm acute infarction in the anterolateral thalamus on the right,
possibly with some involvement of the posterior limb internal
capsule. Acute infarction within the right caudate head. Scattered
other foci of high signal on diffusion imaging within the
hemispheric white matter, not showing restricted diffusion on the
ADC map, and therefore probably representing T2 shine through from
old white matter infarctions.
2. Extensive old small vessel infarctions throughout the brain as
outlined above. Small old cortical infarctions in the right frontal
lobe and both parieto-occipital regions.
3. No large or medium vessel occlusion or correctable proximal
stenosis. Mild atherosclerotic irregularity of the PCA branches.

## 2020-06-25 IMAGING — MR MR HEAD W/O CM
12 of 13 series · 44 of 48 positions shown · non-contrast
Comparison: CT same day

CLINICAL DATA: Neurological deficit.  Acute stroke suspected.

EXAM:
MRI HEAD WITHOUT CONTRAST
MRA HEAD WITHOUT CONTRAST
TECHNIQUE: Multiplanar, multiecho pulse sequences of the brain and surrounding
structures were obtained without intravenous contrast. Angiographic
images of the head were obtained using MRA technique without
contrast.

[Series 5: DWI · axial · 3.0mm · 0.88mm/px · z∈[-110,+34]mm · 8 of 104 slices shown (1 of 4)]
[im 1/104]
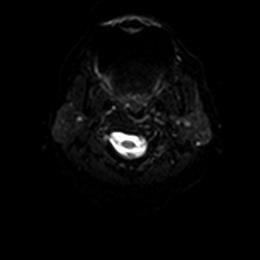
[im 15/104]
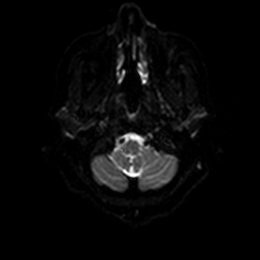
[im 30/104]
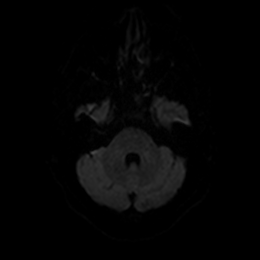
[im 45/104]
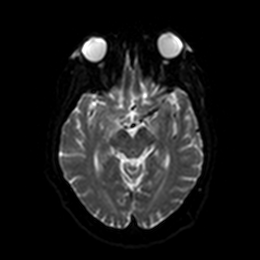
[im 59/104]
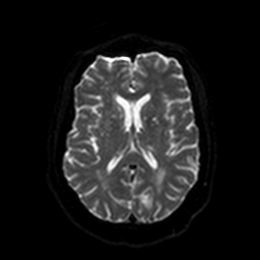
[im 74/104]
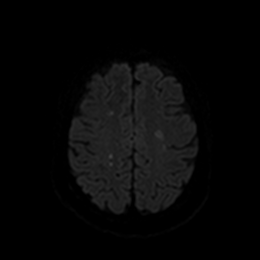
[im 89/104]
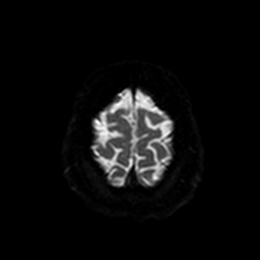
[im 104/104]
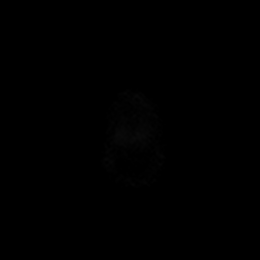

[Series 6: DWI · axial · 3.0mm · 0.88mm/px · z∈[-110,+34]mm · 4 of 52 slices shown (2 of 4)]
[im 1/52]
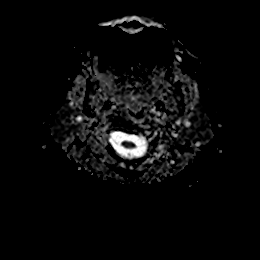
[im 18/52]
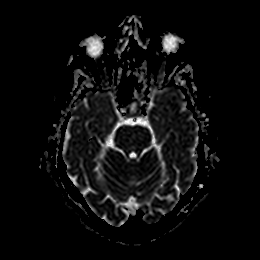
[im 35/52]
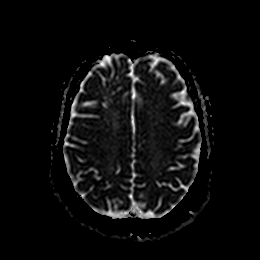
[im 52/52]
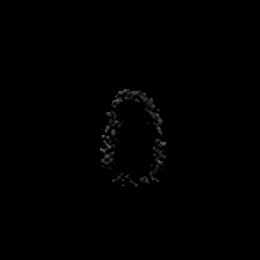

[Series 7: DWI · coronal · 4.0mm · 0.88mm/px · 5 of 76 slices shown (3 of 4)]
[im 1/76]
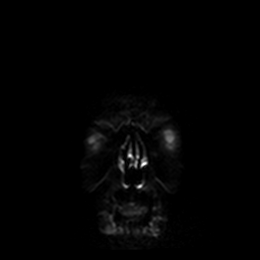
[im 19/76]
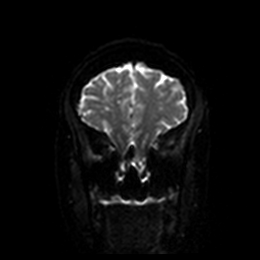
[im 38/76]
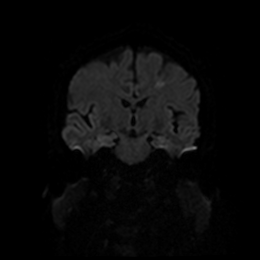
[im 57/76]
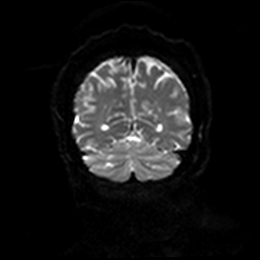
[im 76/76]
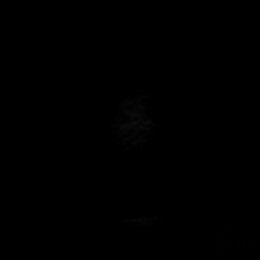

[Series 8: DWI · coronal · 4.0mm · 0.88mm/px · 3 of 38 slices shown (4 of 4)]
[im 1/38]
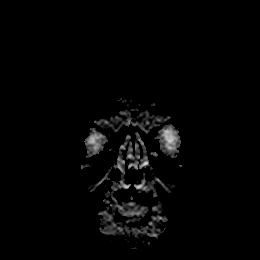
[im 19/38]
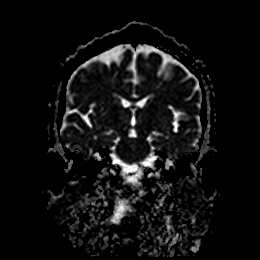
[im 38/38]
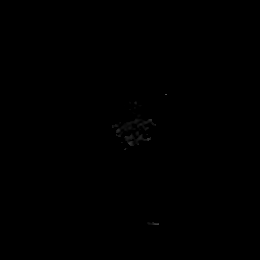

[Series 9: T1 · sagittal · 5.0mm · 0.75mm/px · 2 of 23 slices shown]
[im 1/23]
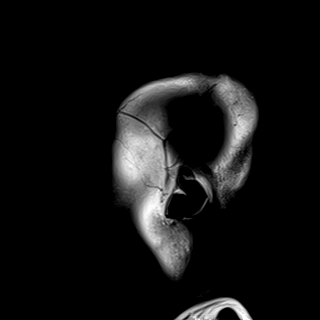
[im 23/23]
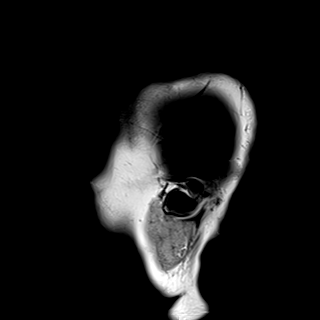

[Series 10: T2 · axial · 5.0mm · 0.72mm/px · z∈[-113,+23]mm · 2 of 25 slices shown (1 of 2)]
[im 1/25]
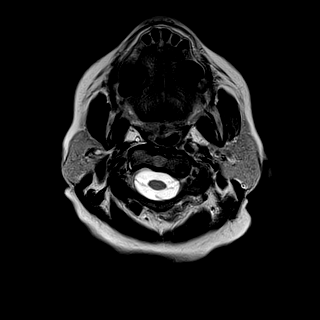
[im 25/25]
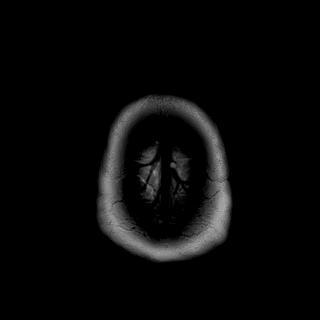

[Series 11: FLAIR · axial · 5.0mm · 0.45mm/px · z∈[-116,+20]mm · 2 of 25 slices shown]
[im 1/25]
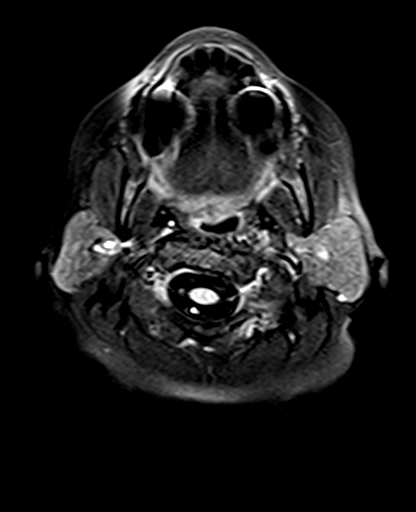
[im 25/25]
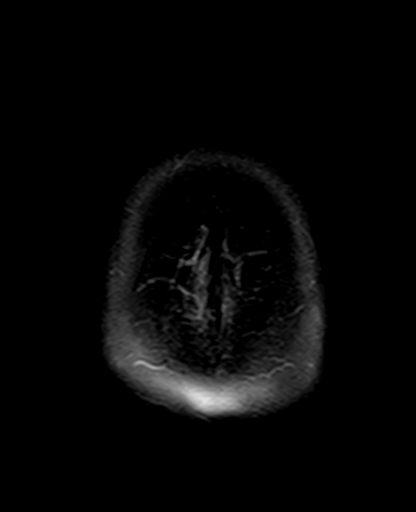

[Series 12: mag_images · axial · 3.0mm · 0.90mm/px · z∈[-121,+47]mm · 4 of 60 slices shown]
[im 1/60]
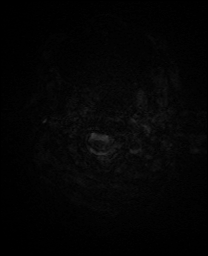
[im 20/60]
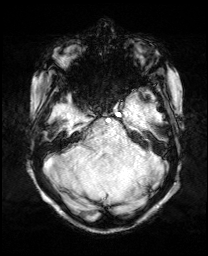
[im 40/60]
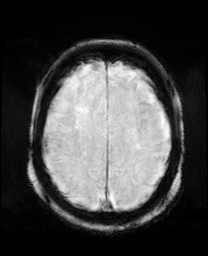
[im 60/60]
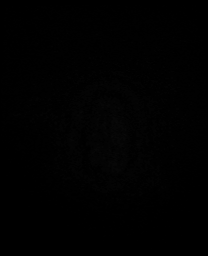

[Series 13: pha_images · axial · 3.0mm · 0.90mm/px · z∈[-121,+44]mm · 4 of 59 slices shown]
[im 1/59]
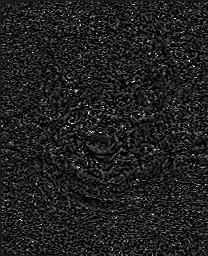
[im 20/59]
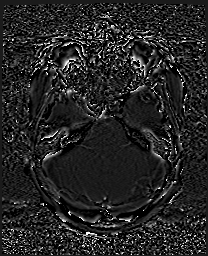
[im 39/59]
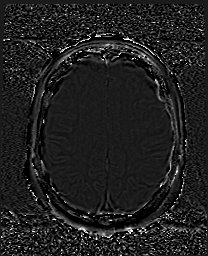
[im 59/59]
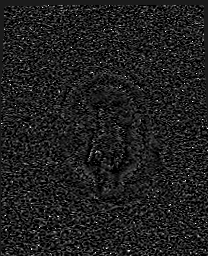

[Series 14: swi_images · axial · 3.0mm · 0.90mm/px · z∈[-121,+47]mm · 4 of 60 slices shown]
[im 1/60]
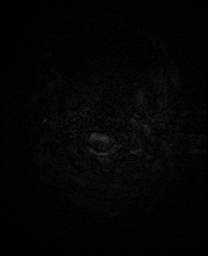
[im 20/60]
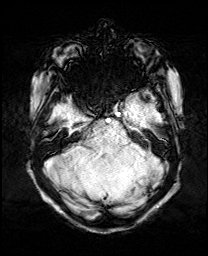
[im 40/60]
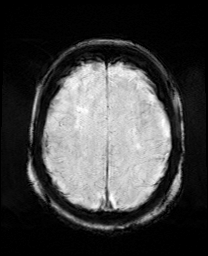
[im 60/60]
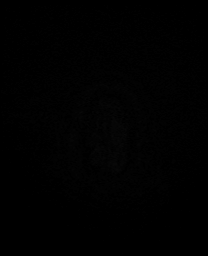

[Series 15: mip_images(sw) · axial · 24.0mm · 0.90mm/px · z∈[-111,+37]mm · 4 of 53 slices shown]
[im 1/53]
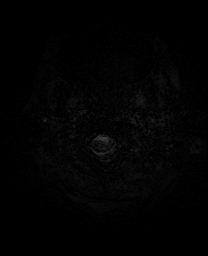
[im 18/53]
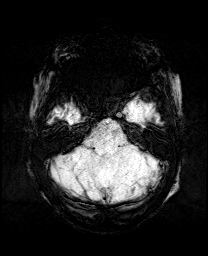
[im 35/53]
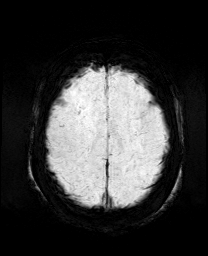
[im 53/53]
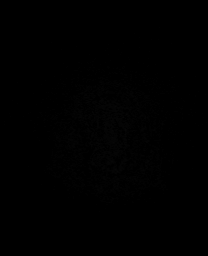

[Series 17: T2 · coronal · 5.0mm · 0.34mm/px · 2 of 29 slices shown (2 of 2)]
[im 1/29]
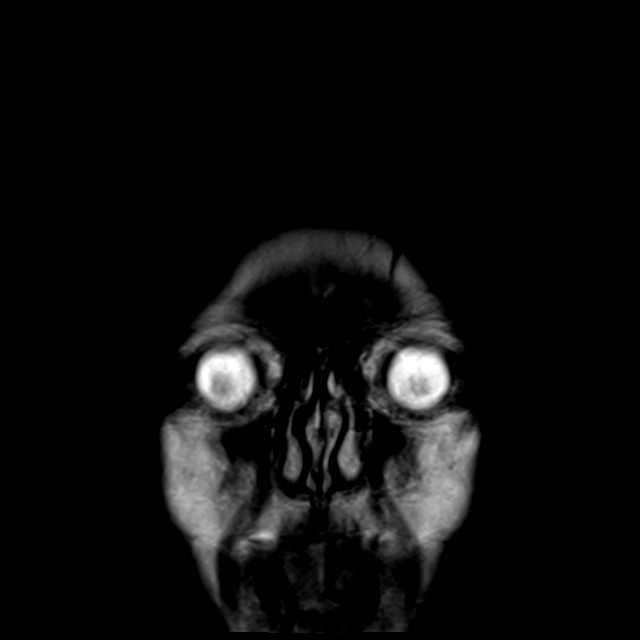
[im 29/29]
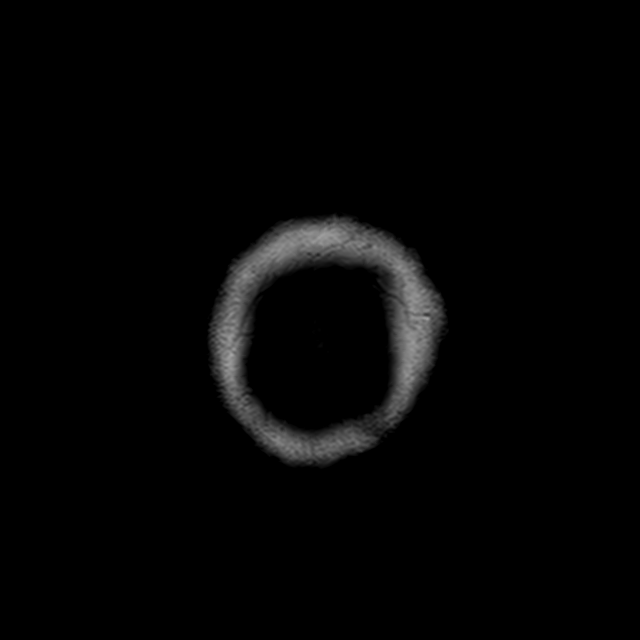

[44 of 48 positions shown; findings below may reference images not displayed]

FINDINGS: MRI HEAD FINDINGS

Brain: Diffusion imaging shows a 1 cm acute infarction in the
anterolateral thalamus on the right, possibly with some involvement
of the posterior limb internal capsule. Acute infarction within the
right caudate head. Scattered punctate foci of high signal on
diffusion imaging within the cerebral hemispheric white matter on
both sides. These white matter lesions likely represent T2 shine
through within old infarctions of the hemispheric white matter, but
do not show low signal on the ADC map. No large confluent
infarction. Chronic small-vessel ischemic changes affect the pons.
There are numerous old small vessel cerebellar infarctions. Old
small vessel infarctions affect both thalami, the basal ganglia
regions in the cerebral hemispheric deep and subcortical white
matter. Old small cortical infarctions noted in the
parieto-occipital regions and right frontal region.

No hemorrhage, mass lesion, hydrocephalus or extra-axial collection.

Vascular: Major vessels at the base of the brain show flow.

Skull and upper cervical spine: Negative

Sinuses/Orbits: Clear/normal

Other: None

MRA HEAD FINDINGS

Both internal carotid arteries widely patent through the skull base
and siphon regions. No stenosis. The anterior and middle cerebral
vessels are normal without evidence of stenosis or large or medium
vessel occlusion.

Both vertebral arteries are widely patent to the basilar. Proximal
basilar fenestration. No basilar stenosis. Posterior circulation
branch vessels are patent. Mild atherosclerotic narrowing and
irregularity of the PCA branches.
IMPRESSION: 1. 1 cm acute infarction in the anterolateral thalamus on the right,
possibly with some involvement of the posterior limb internal
capsule. Acute infarction within the right caudate head. Scattered
other foci of high signal on diffusion imaging within the
hemispheric white matter, not showing restricted diffusion on the
ADC map, and therefore probably representing T2 shine through from
old white matter infarctions.
2. Extensive old small vessel infarctions throughout the brain as
outlined above. Small old cortical infarctions in the right frontal
lobe and both parieto-occipital regions.
3. No large or medium vessel occlusion or correctable proximal
stenosis. Mild atherosclerotic irregularity of the PCA branches.

## 2020-06-25 MED ORDER — STROKE: EARLY STAGES OF RECOVERY BOOK
Freq: Once | Status: AC
  Filled 2020-06-25: qty 1

## 2020-06-25 MED ORDER — SODIUM CHLORIDE 0.9 % IV SOLN: INTRAVENOUS | Status: AC

## 2020-06-25 MED ORDER — ACETAMINOPHEN 650 MG RE SUPP: 650.0000 mg | RECTAL | Status: DC | PRN

## 2020-06-25 MED ORDER — ENOXAPARIN SODIUM 30 MG/0.3ML ~~LOC~~ SOLN
30.0000 mg | SUBCUTANEOUS | Status: DC
  Administered 2020-06-25 – 2020-06-27 (×3): 30 mg via SUBCUTANEOUS
  Filled 2020-06-25 (×3): qty 0.3

## 2020-06-25 MED ORDER — ALBUTEROL SULFATE HFA 108 (90 BASE) MCG/ACT IN AERS
2.0000 | INHALATION_SPRAY | RESPIRATORY_TRACT | Status: DC | PRN
  Filled 2020-06-25: qty 6.7

## 2020-06-25 MED ORDER — INSULIN GLARGINE 100 UNIT/ML ~~LOC~~ SOLN
25.0000 [IU] | Freq: Two times a day (BID) | SUBCUTANEOUS | Status: DC
  Administered 2020-06-26 – 2020-06-30 (×10): 25 [IU] via SUBCUTANEOUS
  Filled 2020-06-25 (×12): qty 0.25

## 2020-06-25 MED ORDER — ICOSAPENT ETHYL 1 G PO CAPS
2.0000 g | ORAL_CAPSULE | Freq: Two times a day (BID) | ORAL | Status: DC
  Administered 2020-06-26 – 2020-06-30 (×8): 2 g via ORAL
  Filled 2020-06-25 (×11): qty 2

## 2020-06-25 MED ORDER — SENNOSIDES-DOCUSATE SODIUM 8.6-50 MG PO TABS: 1.0000 | ORAL_TABLET | Freq: Every evening | ORAL | Status: DC | PRN

## 2020-06-25 MED ORDER — ASPIRIN EC 81 MG PO TBEC
81.0000 mg | DELAYED_RELEASE_TABLET | Freq: Every day | ORAL | Status: DC
  Administered 2020-06-26 – 2020-06-30 (×5): 81 mg via ORAL
  Filled 2020-06-25 (×5): qty 1

## 2020-06-25 MED ORDER — INSULIN ASPART 100 UNIT/ML ~~LOC~~ SOLN
0.0000 [IU] | Freq: Three times a day (TID) | SUBCUTANEOUS | Status: DC
  Administered 2020-06-26: 2 [IU] via SUBCUTANEOUS
  Administered 2020-06-26: 5 [IU] via SUBCUTANEOUS
  Administered 2020-06-27: 2 [IU] via SUBCUTANEOUS
  Administered 2020-06-27: 5 [IU] via SUBCUTANEOUS
  Administered 2020-06-27: 3 [IU] via SUBCUTANEOUS
  Administered 2020-06-28: 1 [IU] via SUBCUTANEOUS
  Administered 2020-06-28: 3 [IU] via SUBCUTANEOUS
  Administered 2020-06-29: 5 [IU] via SUBCUTANEOUS

## 2020-06-25 MED ORDER — MOMETASONE FURO-FORMOTEROL FUM 200-5 MCG/ACT IN AERO
2.0000 | INHALATION_SPRAY | Freq: Two times a day (BID) | RESPIRATORY_TRACT | Status: DC
  Administered 2020-06-25 – 2020-06-30 (×10): 2 via RESPIRATORY_TRACT
  Filled 2020-06-25: qty 8.8

## 2020-06-25 MED ORDER — ACETAMINOPHEN 160 MG/5ML PO SOLN: 650.0000 mg | ORAL | Status: DC | PRN

## 2020-06-25 MED ORDER — FENOFIBRATE 160 MG PO TABS
160.0000 mg | ORAL_TABLET | Freq: Every day | ORAL | Status: DC
  Administered 2020-06-26 – 2020-06-30 (×5): 160 mg via ORAL
  Filled 2020-06-25 (×5): qty 1

## 2020-06-25 MED ORDER — GABAPENTIN 300 MG PO CAPS
300.0000 mg | ORAL_CAPSULE | Freq: Three times a day (TID) | ORAL | Status: DC
  Administered 2020-06-26 – 2020-06-30 (×13): 300 mg via ORAL
  Filled 2020-06-25 (×14): qty 1

## 2020-06-25 MED ORDER — SERTRALINE HCL 50 MG PO TABS
50.0000 mg | ORAL_TABLET | Freq: Every day | ORAL | Status: DC
Start: 2020-06-26 — End: 2020-06-25

## 2020-06-25 MED ORDER — PANTOPRAZOLE SODIUM 40 MG PO TBEC
40.0000 mg | DELAYED_RELEASE_TABLET | Freq: Every day | ORAL | Status: DC
  Administered 2020-06-26 – 2020-06-30 (×5): 40 mg via ORAL
  Filled 2020-06-25 (×6): qty 1

## 2020-06-25 MED ORDER — ACETAMINOPHEN 325 MG PO TABS
650.0000 mg | ORAL_TABLET | ORAL | Status: DC | PRN
  Administered 2020-06-26 – 2020-06-29 (×3): 650 mg via ORAL
  Filled 2020-06-25 (×3): qty 2

## 2020-06-25 MED ORDER — PIRFENIDONE 267 MG PO TABS
3.0000 | ORAL_TABLET | Freq: Three times a day (TID) | ORAL | Status: DC
  Administered 2020-06-29 – 2020-06-30 (×3): 801 mg via ORAL

## 2020-06-25 MED ORDER — UMECLIDINIUM BROMIDE 62.5 MCG/INH IN AEPB
1.0000 | INHALATION_SPRAY | Freq: Every day | RESPIRATORY_TRACT | Status: DC
  Filled 2020-06-25 (×2): qty 7

## 2020-06-25 NOTE — Consult Note (Signed)
Neurology Consultation Reason for Consult: Left-sided weakness and mechanical falls Requesting Physician: Neva Seat  CC: Left leg weakness   History is obtained from: Patient and chart review, as well as older sister at bedside  HPI: Erica [name] is a 65 y.o. right-handed female with a past medical history significant for hypertension, hypercholesterolemia, diabetes complicated by neuropathy, coronary artery disease, depression c/b prior suicide attempt, arthritis, 10-pack-year history of smoking (quit in 2020), COPD/asthma, crack/cocaine use (unclear if continuing)  The patient sister reports that the patient has had some slurred speech on and off for the past week.  The patient lives with her son, and that her sister comes intermittently to help her with self-care issues such as her hair as the patient is too weak to keep her arms overhead for long.  Sister also has significant concern that the patient does not have good medication adherence; patient is currently managing her medications herself.  The patient was in her baseline state of health (intermittently has blurry vision, has palpitations, stable COPD cough and dyspnea on exertion, stable generalized weakness, and additional left upper extremity weakness secondary to orthopedic issues), other than 2 episodes of feeling faint last week that lasted about 10 minutes and resolved with sitting down.  This has not been an issue in the past few days.  At about 10 PM on 06/24/2020 she had a fall due to left-sided weakness.  She was unable to get to the bathroom overnight due to this weakness and had 3 episodes of incontinence that she denies any other urinary symptoms or bowel symptoms.  Her sister did come by this morning to take her out for her birthday etc. and given the patient's condition brought her to the ED instead.  Notably she was hospitalized 04/2019 for suicidal ideation (had not been taking her medications, U tox was positive for  cocaine).  She had an inpatient psychiatric admission at that time  LKW: 8 PM on 06/24/2020 tPA given?: No, due to out of the window Premorbid modified rankin scale:     2 - Slight disability. Able to look after own affairs without assistance, but unable to carry out all previous activities.  ROS: A 14 point ROS was performed and is negative except as noted in the HPI.   Past Medical History:  Diagnosis Date  . Arthritis    especially in shoulders  . Asthma   . Depression   . Diabetes mellitus   . GERD (gastroesophageal reflux disease)   . Headache(784.0)    "mild"  . Hypertension   . Mental disorder    depression  . Neuropathy    feet   . Pain    arthritis pain - takes tramadol as needed  . Rectal polyp    very little bleeding with bowel movements- no pain   Past Surgical History:  Procedure Laterality Date  . ANAL RECTAL MANOMETRY N/A 07/13/2016   Procedure: ANO RECTAL MANOMETRY;  Surgeon: Leighton Ruff, MD;  Location: [facility];  Service: Endoscopy;  Laterality: N/A;  . CESAREAN SECTION    . EUS N/A 11/21/2012   Procedure: LOWER ENDOSCOPIC ULTRASOUND (EUS);  Surgeon: Arta Silence, MD;  Location: [facility];  Service: Endoscopy;  Laterality: N/A;  . FLEXIBLE SIGMOIDOSCOPY N/A 11/21/2012   Procedure: FLEXIBLE SIGMOIDOSCOPY;  Surgeon: Arta Silence, MD;  Location: [facility];  Service: Endoscopy;  Laterality: N/A;  . FLEXIBLE SIGMOIDOSCOPY N/A 01/28/2013   Procedure: FLEXIBLE SIGMOIDOSCOPY;  Surgeon: Leighton Ruff, MD;  Location: [facility];  Service: Endoscopy;  Laterality: N/A;  . LAPAROSCOPIC LOW ANTERIOR RESECTION N/A 01/29/2013   Procedure: LAPAROSCOPIC LOW ANTERIOR RESECTION, Rigid Proctoscopy;  Surgeon: Leighton Ruff, MD;  Location: [facility];  Service: General;  Laterality: N/A;  . LAPAROSCOPIC SIGMOID COLECTOMY N/A 11/14/2012   Procedure: DIAGNOSTIC LAPAROSCOPY AND SIGMOIDMOIDOSCOPY ;  Surgeon: Rolm Bookbinder, MD;  Location: [facility];  Service: General;   Laterality: N/A;  . RECTAL ULTRASOUND N/A 07/13/2016   Procedure: RECTAL ULTRASOUND;  Surgeon: Leighton Ruff, MD;  Location: [facility];  Service: Endoscopy;  Laterality: N/A;  . TONSILLECTOMY    . TONSILLECTOMY AND ADENOIDECTOMY      Current Outpatient Medications  Medication Instructions  . albuterol (PROVENTIL) 2.5 mg, Nebulization, Every 6 hours PRN  . albuterol (VENTOLIN HFA) 108 (90 Base) MCG/ACT inhaler 2 puffs, Inhalation, Every 4 hours PRN  . amLODipine (NORVASC) 10 mg, Oral, Daily  . aspirin EC 81 mg, Oral, Daily  . atorvastatin (LIPITOR) 20 MG tablet TAKE 1 TABLET(20 MG) BY MOUTH DAILY  . busPIRone (BUSPAR) 10 mg, Oral, As needed  . Esbriet 801 mg, Oral, 3 times daily  . fenofibrate (TRICOR) 145 MG tablet TAKE 1 TABLET(145 MG) BY MOUTH DAILY  . fluticasone (FLONASE) 50 MCG/ACT nasal spray 1-2 sprays, Each Nare, As needed  . gabapentin (NEURONTIN) 600 mg, Oral, 3 times daily  . hydrochlorothiazide (MICROZIDE) 12.5 mg, Oral, Daily  . icosapent Ethyl (VASCEPA) 2 g, Oral, 2 times daily  . losartan (COZAAR) 50 mg, Oral, Daily  . metoprolol succinate (TOPROL-XL) 50 MG 24 hr tablet TAKE 1 TABLET(50 MG) BY MOUTH DAILY WITH OR IMMEDIATELY FOLLOWING A MEAL  . Multiple Vitamin (MULTIVITAMIN ADULT PO) 1 tablet, Oral, Daily  . pantoprazole (PROTONIX) 40 mg, Daily  . sertraline (ZOLOFT) 50 mg, Oral, Daily  . SPIRIVA RESPIMAT 1.25 MCG/ACT AERS 2 puffs, Inhalation, Daily  . SYMBICORT 160-4.5 MCG/ACT inhaler 2 puffs, Inhalation, 2 times daily  . Toujeo SoloStar 90 Units, Subcutaneous, Daily at bedtime, 50 in th A.M. AND 40 IN THE P.M.     Family History  Problem Relation Age of Onset  . Hypertension Father   . Stroke Father   . Diabetes Father   . Heart disease Mother   . Hypertension Mother   . Hyperlipidemia Mother   . Hypertension Sister   . Hypertension Brother   . Hypertension Sister   . Hypertension Brother   . Cancer Other 47       colon cancer   . Cancer Maternal Aunt         cancer, unknown type   . Cancer Maternal Uncle        bone cancer   . Cancer Paternal Aunt        lung cancer  . Cancer Paternal Uncle        lung cancer  . Cancer Maternal Uncle        colon cancer and prostate cancer   . Cancer Maternal Uncle        prostate cancer     Social History:  reports that she quit smoking about 2 years ago. Her smoking use included cigarettes. She has a 10.00 pack-year smoking history. She has never used smokeless tobacco. She reports previous alcohol use. She reports current drug use. Drug: "Crack" cocaine.  At this time she denies substance use, but this history was obtained in the presence of her elder sister and needs to be confirmed when family is not present   Exam: Current vital signs: BP  130/86   Pulse 70   Temp 98 F (36.7 C)   Resp 20   SpO2 99%  Vital signs in last 24 hours: Temp:  [98 F (36.7 C)-98.3 F (36.8 C)] 98 F (36.7 C) (01/13 1811) Pulse Rate:  [64-72] 70 (01/13 1811) Resp:  [19-24] 20 (01/13 1811) BP: (130-163)/(83-110) 130/86 (01/13 1811) SpO2:  [97 %-100 %] 99 % (01/13 1811)   Physical Exam  Constitutional: Appears well-developed and well-nourished.  Psych: Affect somewhat flat, poor eye contact, bradykinetic, hypophonic, slow to respond at times Eyes: No scleral injection HENT: No OP obstruction, moderately poor dentition MSK: no joint deformities.  Cardiovascular: Normal rate and regular rhythm.  Respiratory: Mild wheezing, baseline per patient/family GI: Soft.  No distension. There is no tenderness.  Skin: WDI  Neuro: Mental Status: Patient is awake, alert, oriented to person, place, month, year, and situation.  She is sometimes very slow to respond Patient is able to give a clear and coherent history. No signs of aphasia or neglect Cranial Nerves: II: Visual Fields are full. Pupils are equal, round, and reactive to light.   III,IV, VI: EOMI without ptosis or diploplia.  V: Facial sensation is  notable for some loss of sensation in left V3 area VII: Facial movement is symmetric.  VIII: hearing is intact to voice X: Uvula elevates symmetrically XI: Shoulder shrug is symmetric. XII: tongue is midline without atrophy or fasciculations.  Motor: Right exterior extremity is 5/5 throughout, she guards with any movement of the left upper extremity due to shoulder pain and generally has poor effort but with coaching and seems to be at least 4/5 throughout.  Right lower extremity is 4/5, left lower extremity is 3/5 Sensory: She reports reduced sensation in the left lower extremity, 75% reduction in pinprick and temperature compared to the right Deep Tendon Reflexes: 3+ and symmetric in the biceps, brachioradialis, patella, positive Hoffmann's, negative jaw jerk, negative clonus Plantars: Toes are downgoing bilaterally. Cerebellar: FNF and HKS are intact bilaterally  NIHSS total 3 Score breakdown:  1- left upper extremity weakness, 1-left lower extremity weakness, 1- partial sensory loss on the left leg and left face  I have reviewed labs in epic and the results pertinent to this consultation are: AKI (creatinine 1.9 from baseline of 1.4), associated with increased BUN from baseline of 19 -> 40 CBC with a normocytic anemia (hemoglobin 11.1) with increased RDW of 18.6  Lab Results  Component Value Date   HGBA1C 7.9 (H) 05/11/2019    Lab Results  Component Value Date   CHOL 147 04/23/2020   HDL 50 04/23/2020   LDLCALC 66 04/23/2020   LDLDIRECT 56 04/23/2020   TRIG 187 (H) 04/23/2020   CHOLHDL 2.5 10/30/2019   Lab Results  Component Value Date   TSH 2.562 08/21/2008    No results found for: RPR No results found for: HIV1X2   I have reviewed the images obtained: Head CT personally reviewed, significant chronic microvascular change which could be obscuring an acute/subacute stroke  MRI brain with R caudate and R thalamic acute stroke Punctate DWI lesions, most of which do  appear to be T2 shine through, but one L frontal lesion may have associated ADC change:    MRA scattered athero w/o significant stenosis   Impression: This is a 65 year old woman with vascular risk factors as above, presenting with 1 week of slurred speech, and left-sided weakness and sensation changes.  Her exam corroborates the history provided but additionally she has hyperreflexia symmetrically  of the bilateral upper extremities, negative jaw jerk, concerning for potential C-spine pathology.  She generally has a great deal of fatigue and poor exercise tolerance as well.  Given her prior high risk behaviors (U tox pending), will additionally obtain RPR and HIV to rule out potentially infectious contributions to her presentation.  Recommendations:  # Multifocal strokes, cardioembolic versus aortic arch atheroembolic - Stroke labs TSH, RPR, HIV, HgbA1c, fasting lipid panel, UA, UDS  If RPR is positive, consider LP to rule out neurosyphilis  - MRI brain completed as above - MRA of the brain without contrast completed as above - Frequent neuro checks - Echocardiogram - Carotid dopplers given borderline renal function is a relative contraindication to CTA - Continue home atorvastatin 20 mg, consider escalation if LDL above goal of less than 70 - Continue home ASA 81 mg daily -- stop if no cardiac indication AND if indication for [facility] found - Consider Plavix 300 mg load with 75 mg daily for 21 - 90 day course if no need for LP - Risk factor modification - Telemetry monitoring; 30 day event monitor on discharge if no arrythmias captured  - Blood pressure goal   - Permissive hypertension to 220/120 until carotid duplex completed to confirm no critical stenosis  - PT consult, OT consult, Speech consult - Stroke team to follow  # Hyper-reflexia - MRI C-spine  Extensive counseling was done with both patient and sister at bedside.  MRI brain was reviewed with the patient and sister, as well as  risk factor modification counseling and discussion of the findings on my examination and plan as above.  Lesleigh Noe MD-PhD [facility] [phone]

## 2020-06-25 NOTE — ED Provider Notes (Signed)
[facility] EMERGENCY DEPARTMENT Provider Note   CSN: [id number] Arrival date & time: 06/25/20  1638     History Chief Complaint  Patient presents with  . Stroke Symptoms    Erica [name] is a 65 y.o. female.  HPI   65 year old female past medical history of HTN, HLD, DM presents to the emergency department with left-sided weakness and mechanical falls.  Patient states she last felt normal last night around 8 PM before she went to bed.  When she tried to get up from bed around 9 PM she had left sided weakness that resulted in a mechanical fall.  This happened again overnight around 1 AM.  All day today she has not been able to weight-bear or walk due to the left-sided weakness.  She is now experiencing weakness in the left upper extremity, sensory changes to the left side of the lips.  Denies any headache, vision change.  Family member at bedside feels that her speech has been slurred off and on for the past week.  No recent fever, chest pain or other acute illness.  She does have strong family history of stroke.  Past Medical History:  Diagnosis Date  . Arthritis    especially in shoulders  . Asthma   . Depression   . Diabetes mellitus   . GERD (gastroesophageal reflux disease)   . Headache(784.0)    "mild"  . Hypertension   . Mental disorder    depression  . Neuropathy    feet   . Pain    arthritis pain - takes tramadol as needed  . Rectal polyp    very little bleeding with bowel movements- no pain    Patient Active Problem List   Diagnosis Date Noted  . CVA (cerebral vascular accident) ([place]) 06/25/2020  . BMI 32.0-32.9,adult 06/03/2020  . Left upper lobe pneumonia 03/30/2020  . Chest pain 10/01/2019  . 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 Suicide attempt ([place]) 05/09/2019  . Cocaine abuse ([place]) 05/09/2019  . Interstitial lung disease ([place]) 06/21/2016  . COPD (chronic obstructive pulmonary  disease) ([place]) 06/21/2016  . Cough 06/21/2016  . GERD (gastroesophageal reflux disease) 06/21/2016  . Hypersomnia 06/21/2016  . Tobacco user 06/21/2016  . Rectal cancer ([place]) 01/30/2013    Past Surgical History:  Procedure Laterality Date  . ANAL RECTAL MANOMETRY N/A 07/13/2016   Procedure: ANO RECTAL MANOMETRY;  Surgeon: Leighton Ruff, MD;  Location: [facility];  Service: Endoscopy;  Laterality: N/A;  . CESAREAN SECTION    . EUS N/A 11/21/2012   Procedure: LOWER ENDOSCOPIC ULTRASOUND (EUS);  Surgeon: Arta Silence, MD;  Location: [facility];  Service: Endoscopy;  Laterality: N/A;  . FLEXIBLE SIGMOIDOSCOPY N/A 11/21/2012   Procedure: FLEXIBLE SIGMOIDOSCOPY;  Surgeon: Arta Silence, MD;  Location: [facility];  Service: Endoscopy;  Laterality: N/A;  . FLEXIBLE SIGMOIDOSCOPY N/A 01/28/2013   Procedure: FLEXIBLE SIGMOIDOSCOPY;  Surgeon: Leighton Ruff, MD;  Location: [facility];  Service: Endoscopy;  Laterality: N/A;  . LAPAROSCOPIC LOW ANTERIOR RESECTION N/A 01/29/2013   Procedure: LAPAROSCOPIC LOW ANTERIOR RESECTION, Rigid Proctoscopy;  Surgeon: Leighton Ruff, MD;  Location: [facility];  Service: General;  Laterality: N/A;  . LAPAROSCOPIC SIGMOID COLECTOMY N/A 11/14/2012   Procedure: DIAGNOSTIC LAPAROSCOPY AND SIGMOIDMOIDOSCOPY ;  Surgeon: Rolm Bookbinder, MD;  Location: [facility];  Service: General;  Laterality: N/A;  . RECTAL ULTRASOUND N/A 07/13/2016   Procedure: RECTAL ULTRASOUND;  Surgeon: Leighton Ruff, MD;  Location: [facility];  Service: Endoscopy;  Laterality: N/A;  . TONSILLECTOMY    . TONSILLECTOMY AND ADENOIDECTOMY       OB History    Gravida  3   Para      Term      Preterm      AB  2   Living  1     SAB      IAB  2   Ectopic      Multiple      Live Births              Family History  Problem Relation Age of Onset  . Hypertension Father   . Stroke Father   . Diabetes Father   . Heart disease Mother   . Hypertension Mother   . Hyperlipidemia  Mother   . Hypertension Sister   . Hypertension Brother   . Hypertension Sister   . Hypertension Brother   . Cancer Other 47       colon cancer   . Cancer Maternal Aunt        cancer, unknown type   . Cancer Maternal Uncle        bone cancer   . Cancer Paternal Aunt        lung cancer  . Cancer Paternal Uncle        lung cancer  . Cancer Maternal Uncle        colon cancer and prostate cancer   . Cancer Maternal Uncle        prostate cancer     Social History   Tobacco Use  . Smoking status: Former Smoker    Packs/day: 0.50    Years: 20.00    Pack years: 10.00    Types: Cigarettes    Quit date: 01/05/2018    Years since quitting: 2.4  . Smokeless tobacco: Never Used  . Tobacco comment: Unknown   Vaping Use  . Vaping Use: Never used  Substance Use Topics  . Alcohol use: Not Currently  . Drug use: Yes    Types: "Crack" cocaine    Comment: Unknown     Home Medications Prior to Admission medications   Medication Sig Start Date End Date Taking? Authorizing Provider  albuterol (PROVENTIL) (2.5 MG/3ML) 0.083% nebulizer solution Take 3 mLs (2.5 mg total) by nebulization every 6 (six) hours as needed for wheezing or shortness of breath. 03/08/18   Martyn Ehrich, NP  albuterol (VENTOLIN HFA) 108 (90 Base) MCG/ACT inhaler Inhale 2 puffs into the lungs every 4 (four) hours as needed for wheezing. 09/20/19   Parrett, Fonnie Mu, NP  amLODipine (NORVASC) 10 MG tablet Take 10 mg by mouth daily.  09/04/17   [provider]  aspirin EC 81 MG tablet Take 81 mg by mouth daily.    [provider]  atorvastatin (LIPITOR) 20 MG tablet TAKE 1 TABLET(20 MG) BY MOUTH DAILY 03/30/20   Tolia, Sunit, DO  busPIRone (BUSPAR) 10 MG tablet Take 10 mg by mouth as needed. 02/04/20   [provider]  fenofibrate (TRICOR) 145 MG tablet TAKE 1 TABLET(145 MG) BY MOUTH DAILY Patient taking differently: 145 mg. 02/13/20   Tolia, Sunit, DO  fluticasone (FLONASE) 50 MCG/ACT nasal spray  Place 1-2 sprays into both nostrils as needed. 02/14/20   [provider]  gabapentin (NEURONTIN) 300 MG capsule Take 600 mg by mouth 3 (three) times daily.  08/09/12   [provider]  hydrochlorothiazide (MICROZIDE) 12.5 MG capsule Take 1 capsule (12.5  mg total) by mouth daily. 05/14/19   Rankin, Shuvon B, NP  icosapent Ethyl (VASCEPA) 1 g capsule Take 2 capsules (2 g total) by mouth 2 (two) times daily. 05/05/20 08/03/20  Tolia, Sunit, DO  losartan (COZAAR) 50 MG tablet Take 1 tablet (50 mg total) by mouth daily. 05/14/19   Rankin, Shuvon B, NP  metoprolol succinate (TOPROL-XL) 50 MG 24 hr tablet TAKE 1 TABLET(50 MG) BY MOUTH DAILY WITH OR IMMEDIATELY FOLLOWING A MEAL 07/08/19   Miquel Dunn, NP  Multiple Vitamin (MULTIVITAMIN ADULT PO) Take 1 tablet by mouth daily. 07/11/19   [provider]  pantoprazole (PROTONIX) 40 MG tablet Take 40 mg by mouth daily.    [provider]  Pirfenidone (ESBRIET) 267 MG TABS Take 3 tablets (801 mg total) by mouth in the morning, at noon, and at bedtime. 03/31/20   Martyn Ehrich, NP  sertraline (ZOLOFT) 50 MG tablet Take 1 tablet (50 mg total) by mouth daily. 05/14/19   Rankin, Shuvon B, NP  SPIRIVA RESPIMAT 1.25 MCG/ACT AERS INHALE 2 PUFFS INTO THE LUNGS DAILY 04/13/20   Martyn Ehrich, NP  SYMBICORT 160-4.5 MCG/ACT inhaler Inhale 2 puffs into the lungs 2 (two) times daily. 09/20/19   Parrett, Fonnie Mu, NP  TOUJEO SOLOSTAR 300 UNIT/ML SOPN Inject 90 Units into the skin at bedtime. 50 in th A.M. AND 40 IN THE P.M. 03/10/15   [provider]    Allergies    Penicillins  Review of Systems   Review of Systems  Constitutional: Negative for chills and fever.  HENT: Negative for congestion.   Eyes: Negative for visual disturbance.  Respiratory: Negative for shortness of breath.   Cardiovascular: Negative for chest pain.  Gastrointestinal: Negative for abdominal pain, diarrhea and vomiting.  Genitourinary:  Negative for dysuria.  Skin: Negative for rash.  Neurological: Positive for speech difficulty, weakness and numbness. Negative for facial asymmetry and headaches.    Physical Exam Updated Vital Signs BP 130/86   Pulse 70   Temp 98 F (36.7 C)   Resp 20   SpO2 99%   Physical Exam Vitals and nursing note reviewed.  Constitutional:      Appearance: Normal appearance.  HENT:     Head: Normocephalic.     Mouth/Throat:     Mouth: Mucous membranes are moist.  Cardiovascular:     Rate and Rhythm: Normal rate.  Pulmonary:     Effort: Pulmonary effort is normal. No respiratory distress.  Abdominal:     Palpations: Abdomen is soft.     Tenderness: There is no abdominal tenderness.  Skin:    General: Skin is warm.  Neurological:     Mental Status: She is alert and oriented to person, place, and time.     Comments: See NIH  Psychiatric:        Mood and Affect: Mood normal.     ED Results / Procedures / Treatments   Labs (all labs ordered are listed, but only abnormal results are displayed) Labs Reviewed  CBC - Abnormal; Notable for the following components:      Result Value   RBC 3.35 (*)    Hemoglobin 11.1 (*)    HCT 31.3 (*)    RDW 18.6 (*)    All other components within normal limits  COMPREHENSIVE METABOLIC PANEL - Abnormal; Notable for the following components:   CO2 20 (*)    Glucose, Bld 128 (*)    BUN 40 (*)  Creatinine, Ser 1.90 (*)    AST 63 (*)    GFR, Estimated 29 (*)    All other components within normal limits  I-STAT CHEM 8, ED - Abnormal; Notable for the following components:   Potassium 5.8 (*)    BUN 63 (*)    Creatinine, Ser 1.90 (*)    Glucose, Bld 126 (*)    Hemoglobin 10.9 (*)    HCT 32.0 (*)    All other components within normal limits  CBG MONITORING, ED - Abnormal; Notable for the following components:   Glucose-Capillary 100 (*)    All other components within normal limits  SARS CORONAVIRUS 2 (TAT 6-24 HRS)  PROTIME-INR  APTT   DIFFERENTIAL  HIV ANTIBODY (ROUTINE TESTING W REFLEX)  HEMOGLOBIN A1C  LIPID PANEL    EKG None  Radiology CT HEAD WO CONTRAST  Result Date: 06/25/2020 CLINICAL DATA:  Neuro deficit, acute, stroke suspected Left-sided weakness, fall. EXAM: CT HEAD WITHOUT CONTRAST TECHNIQUE: Contiguous axial images were obtained from the base of the skull through the vertex without intravenous contrast. COMPARISON:  Remote head CT 10/08/2010 FINDINGS: Brain: Brain volume is normal for age. There is a lacunar infarct in the right caudate as well as the right cerebellum, new from 2012 but age indeterminate. No evidence of large or territorial ischemia. No intracranial hemorrhage, mass effect, or midline shift. No hydrocephalus. The basilar cisterns are patent. No extra-axial or intracranial fluid collection. Vascular: Atherosclerosis of skullbase vasculature without hyperdense vessel or abnormal calcification. Skull: No fracture or focal lesion. Sinuses/Orbits: Mucosal thickening with bubbly debris and associated adjacent sclerosis of the left sphenoid sinus consistent chronic sinusitis. Remaining paranasal sinuses are clear. No mastoid effusion. Included orbits are unremarkable. Other: None. IMPRESSION: 1. Lacunar infarcts in the right caudate and right cerebellum, new from 2012, but age indeterminate. 2. No hemorrhage or evidence of large or territorial ischemia. 3. Chronic left sphenoid sinusitis. Electronically Signed   By: Keith Rake M.D.   On: 06/25/2020 17:43    Procedures .Critical Care Performed by: Lorelle Gibbs, DO Authorized by: Lorelle Gibbs, DO   Critical care provider statement:    Critical care time (minutes):  45   Critical care was time spent personally by me on the following activities:  Discussions with consultants, evaluation of patient's response to treatment, examination of patient, ordering and performing treatments and interventions, ordering and review of laboratory  studies, ordering and review of radiographic studies, pulse oximetry, re-evaluation of patient's condition, obtaining history from patient or surrogate and review of old charts   I assumed direction of critical care for this patient from another provider in my specialty: no     (including critical care time)  Medications Ordered in ED Medications  aspirin EC tablet 81 mg (has no administration in time range)  fenofibrate tablet 160 mg (has no administration in time range)  icosapent Ethyl (VASCEPA) 1 g capsule 2 g (has no administration in time range)  sertraline (ZOLOFT) tablet 50 mg (has no administration in time range)  pantoprazole (PROTONIX) EC tablet 40 mg (has no administration in time range)  gabapentin (NEURONTIN) capsule 600 mg (has no administration in time range)  albuterol (VENTOLIN HFA) 108 (90 Base) MCG/ACT inhaler 2 puff (has no administration in time range)  Pirfenidone TABS 801 mg (has no administration in time range)  Tiotropium Bromide Monohydrate AERS 2 puff (has no administration in time range)  mometasone-formoterol (DULERA) 200-5 MCG/ACT inhaler 2 puff (has no administration in time  range)   stroke: mapping our early stages of recovery book (has no administration in time range)  0.9 %  sodium chloride infusion (has no administration in time range)  acetaminophen (TYLENOL) tablet 650 mg (has no administration in time range)    Or  acetaminophen (TYLENOL) 160 MG/5ML solution 650 mg (has no administration in time range)    Or  acetaminophen (TYLENOL) suppository 650 mg (has no administration in time range)  senna-docusate (Senokot-S) tablet 1 tablet (has no administration in time range)  enoxaparin (LOVENOX) injection 30 mg (has no administration in time range)    ED Course  I have reviewed the triage vital signs and the nursing notes.  Pertinent labs & imaging results that were available during my care of the patient were reviewed by me and considered in my  medical decision making (see chart for details).    MDM Rules/Calculators/A&P                          65 year old female presents emergency department left-sided weakness.  She has an NIH of 7, vitals are stable.  We are almost at 24 hours from left-sided weakness onset.  CT of the head shows right-sided lacunar infarcts, one in the location that could explain her left-sided weakness.  Neurology has been consulted, patient will be admitted for stroke work-up.  Patients evaluation and results requires admission for further treatment and care. Patient agrees with admission plan, offers no new complaints and is stable/unchanged at time of admit.  Final Clinical Impression(s) / ED Diagnoses Final diagnoses:  None    Rx / DC Orders ED Discharge Orders    None       Lorelle Gibbs, DO 06/25/20 1922

## 2020-06-25 NOTE — H&P (Addendum)
History and Physical   Erica [name] CLE:[id number] DOB: 02-24-1956 DOA: 06/25/2020  PCP: Nolene Ebbs, MD   Patient coming from: PCP office  Chief Complaint: Left-sided weakness, fall  HPI: Erica [name] is a 65 y.o. female with medical history significant of hypertension, hyperlipidemia, diabetes, COPD, ILD, GERD, depression, cocaine use, rectal cancer who presents with 1 day of left-sided weakness and recent fall.  Patient began to experience left-sided weakness yesterday worse in her lower extremity.  She also had a fall yesterday.  Due to this she went to see her PCP today who had concerns for possible stroke and sent her to the ED for further evaluation.  Upon further questioning she states that she was last normal around 9:00 yesterday evening, when her symptoms started.  In addition to her weakness she noticed some numbness at her left lip and a feeling like she just been to the dentist as far as her teeth are concerned.  She has some chronic shortness of breath with her pulmonary disease and her sister states she has had some recent leg cramping but denies other new symptoms.  No fever, cough, chest pain, abdominal pain.  ED Course: Vitals in the ED significant for blood pressure in the 944H to 675F systolic, respiratory rate in the low 20s.  Lab work-up showed BMP with BUN 40, creatinine of 1.9 from baseline 1.4, bicarb 20.  LFTs with AST of 63.  CBC showed hemoglobin 11.1 which is stable.  PT, PTT, INR within normal limits.  Respiratory panel for flu and COVID pending.  CT head showed right lacunar infarcts which were age-indeterminate but no hemorrhage.  Neurology was consulted and MRI was ordered.  Review of Systems: As per HPI otherwise all other systems reviewed and are negative.  Past Medical History:  Diagnosis Date  . Arthritis    especially in shoulders  . Asthma   . Depression   . Diabetes mellitus   . GERD (gastroesophageal reflux disease)   . Headache(784.0)    "mild"   . Hypertension   . Mental disorder    depression  . Neuropathy    feet   . Pain    arthritis pain - takes tramadol as needed  . Rectal polyp    very little bleeding with bowel movements- no pain    Past Surgical History:  Procedure Laterality Date  . ANAL RECTAL MANOMETRY N/A 07/13/2016   Procedure: ANO RECTAL MANOMETRY;  Surgeon: Leighton Ruff, MD;  Location: [facility];  Service: Endoscopy;  Laterality: N/A;  . CESAREAN SECTION    . EUS N/A 11/21/2012   Procedure: LOWER ENDOSCOPIC ULTRASOUND (EUS);  Surgeon: Arta Silence, MD;  Location: [facility];  Service: Endoscopy;  Laterality: N/A;  . FLEXIBLE SIGMOIDOSCOPY N/A 11/21/2012   Procedure: FLEXIBLE SIGMOIDOSCOPY;  Surgeon: Arta Silence, MD;  Location: [facility];  Service: Endoscopy;  Laterality: N/A;  . FLEXIBLE SIGMOIDOSCOPY N/A 01/28/2013   Procedure: FLEXIBLE SIGMOIDOSCOPY;  Surgeon: Leighton Ruff, MD;  Location: [facility];  Service: Endoscopy;  Laterality: N/A;  . LAPAROSCOPIC LOW ANTERIOR RESECTION N/A 01/29/2013   Procedure: LAPAROSCOPIC LOW ANTERIOR RESECTION, Rigid Proctoscopy;  Surgeon: Leighton Ruff, MD;  Location: [facility];  Service: General;  Laterality: N/A;  . LAPAROSCOPIC SIGMOID COLECTOMY N/A 11/14/2012   Procedure: DIAGNOSTIC LAPAROSCOPY AND SIGMOIDMOIDOSCOPY ;  Surgeon: Rolm Bookbinder, MD;  Location: [facility];  Service: General;  Laterality: N/A;  . RECTAL ULTRASOUND N/A 07/13/2016   Procedure: RECTAL ULTRASOUND;  Surgeon: Leighton Ruff, MD;  Location: [facility];  Service: Endoscopy;  Laterality: N/A;  . TONSILLECTOMY    . TONSILLECTOMY AND ADENOIDECTOMY      Social History  reports that she quit smoking about 2 years ago. Her smoking use included cigarettes. She has a 10.00 pack-year smoking history. She has never used smokeless tobacco. She reports previous alcohol use. She reports current drug use. Drug: "Crack" cocaine.  Allergies  Allergen Reactions  . Penicillins Hives, Itching and Swelling     Tongue swells up Has patient had a PCN reaction causing immediate rash, facial/tongue/throat swelling, SOB or lightheadedness with hypotension: Yes Has patient had a PCN reaction causing severe rash involving mucus membranes or skin necrosis: Yes Has patient had a PCN reaction that required hospitalization Yes Has patient had a PCN reaction occurring within the last 10 years: No If all of the above answers are "NO", then may proceed with Cephalosporin use.     Family History  Problem Relation Age of Onset  . Hypertension Father   . Stroke Father   . Diabetes Father   . Heart disease Mother   . Hypertension Mother   . Hyperlipidemia Mother   . Hypertension Sister   . Hypertension Brother   . Hypertension Sister   . Hypertension Brother   . Cancer Other 47       colon cancer   . Cancer Maternal Aunt        cancer, unknown type   . Cancer Maternal Uncle        bone cancer   . Cancer Paternal Aunt        lung cancer  . Cancer Paternal Uncle        lung cancer  . Cancer Maternal Uncle        colon cancer and prostate cancer   . Cancer Maternal Uncle        prostate cancer   Reviewed on admission  Prior to Admission medications   Medication Sig Start Date End Date Taking? Authorizing Provider  albuterol (PROVENTIL) (2.5 MG/3ML) 0.083% nebulizer solution Take 3 mLs (2.5 mg total) by nebulization every 6 (six) hours as needed for wheezing or shortness of breath. 03/08/18   Martyn Ehrich, NP  albuterol (VENTOLIN HFA) 108 (90 Base) MCG/ACT inhaler Inhale 2 puffs into the lungs every 4 (four) hours as needed for wheezing. 09/20/19   Parrett, Fonnie Mu, NP  amLODipine (NORVASC) 10 MG tablet Take 10 mg by mouth daily.  09/04/17   [provider]  aspirin EC 81 MG tablet Take 81 mg by mouth daily.    [provider]  atorvastatin (LIPITOR) 20 MG tablet TAKE 1 TABLET(20 MG) BY MOUTH DAILY 03/30/20   Tolia, Sunit, DO  busPIRone (BUSPAR) 10 MG tablet Take 10 mg by mouth as  needed. 02/04/20   [provider]  fenofibrate (TRICOR) 145 MG tablet TAKE 1 TABLET(145 MG) BY MOUTH DAILY Patient taking differently: 145 mg. 02/13/20   Tolia, Sunit, DO  fluticasone (FLONASE) 50 MCG/ACT nasal spray Place 1-2 sprays into both nostrils as needed. 02/14/20   [provider]  gabapentin (NEURONTIN) 300 MG capsule Take 600 mg by mouth 3 (three) times daily.  08/09/12   [provider]  hydrochlorothiazide (MICROZIDE) 12.5 MG capsule Take 1 capsule (12.5 mg total) by mouth daily. 05/14/19   Rankin, Shuvon B, NP  icosapent Ethyl (VASCEPA) 1 g capsule Take 2 capsules (2 g total) by mouth 2 (two) times daily. 05/05/20 08/03/20  Tolia, Sunit, DO  losartan (  COZAAR) 50 MG tablet Take 1 tablet (50 mg total) by mouth daily. 05/14/19   Rankin, Shuvon B, NP  metoprolol succinate (TOPROL-XL) 50 MG 24 hr tablet TAKE 1 TABLET(50 MG) BY MOUTH DAILY WITH OR IMMEDIATELY FOLLOWING A MEAL 07/08/19   Miquel Dunn, NP  Multiple Vitamin (MULTIVITAMIN ADULT PO) Take 1 tablet by mouth daily. 07/11/19   [provider]  pantoprazole (PROTONIX) 40 MG tablet Take 40 mg by mouth daily.    [provider]  Pirfenidone (ESBRIET) 267 MG TABS Take 3 tablets (801 mg total) by mouth in the morning, at noon, and at bedtime. 03/31/20   Martyn Ehrich, NP  sertraline (ZOLOFT) 50 MG tablet Take 1 tablet (50 mg total) by mouth daily. 05/14/19   Rankin, Shuvon B, NP  SPIRIVA RESPIMAT 1.25 MCG/ACT AERS INHALE 2 PUFFS INTO THE LUNGS DAILY 04/13/20   Martyn Ehrich, NP  SYMBICORT 160-4.5 MCG/ACT inhaler Inhale 2 puffs into the lungs 2 (two) times daily. 09/20/19   Parrett, Fonnie Mu, NP  TOUJEO SOLOSTAR 300 UNIT/ML SOPN Inject 90 Units into the skin at bedtime. 50 in th A.M. AND 40 IN THE P.M. 03/10/15   [provider]    Physical Exam: Vitals:   06/25/20 1700 06/25/20 1715 06/25/20 1730 06/25/20 1811  BP: (!) 155/93 (!) 163/105 (!) 158/110 130/86  Pulse: 65 71 64 70   Resp: (!) 24 19 (!) 23 20  Temp:    98 F (36.7 C)  SpO2: 97% 100% 99% 99%   Physical Exam Constitutional:      General: She is not in acute distress.    Appearance: Normal appearance.  HENT:     Head: Normocephalic and atraumatic.     Mouth/Throat:     Mouth: Mucous membranes are moist.     Pharynx: Oropharynx is clear.  Eyes:     Extraocular Movements: Extraocular movements intact.     Pupils: Pupils are equal, round, and reactive to light.  Cardiovascular:     Rate and Rhythm: Normal rate and regular rhythm.     Pulses: Normal pulses.     Heart sounds: Normal heart sounds.  Pulmonary:     Effort: Pulmonary effort is normal. No respiratory distress.     Breath sounds: Normal breath sounds.  Abdominal:     General: Bowel sounds are normal. There is no distension.     Palpations: Abdomen is soft.     Tenderness: There is no abdominal tenderness.  Musculoskeletal:        General: No swelling or deformity.  Skin:    General: Skin is warm and dry.  Neurological:     Comments: Mental Status: Patient is awake, alert, oriented x3 No signs of aphasia or neglect Cranial Nerves: II: Pupils equal, round, and reactive to light.  III,IV, VI: EOMI without ptosis or diploplia.  V: Facial sensation is symmetric tolight touch other than some numbness and tingling at her left lip VII: Facial movement is symmetric.  VIII: hearing is intact to voice X: Uvula elevates symmetrically XI: Shoulder shrug is decreased on the left. XII: tongue is midline without atrophy or fasciculations.  Motor: Good effort thorughout, at Least 5 out of 5 right upper extremity, 4+ out of 5 left upper extremity, 5 out of 5 right lower extremity, 4 out of 5 left lower extremity. Sensory: Sensation is grossly intact bilateral UEs & LEs Cerebellar: Finger-Nose intact on right but difficulty on left.      Labs  on Admission: I have personally reviewed following labs and imaging studies  CBC: Recent Labs   Lab 06/25/20 1700 06/25/20 1724  WBC 5.9  --   NEUTROABS 4.4  --   HGB 11.1* 10.9*  HCT 31.3* 32.0*  MCV 93.4  --   PLT 169  --     Basic Metabolic Panel: Recent Labs  Lab 06/25/20 1700 06/25/20 1724  NA 136 138  K 5.0 5.8*  CL 106 109  CO2 20*  --   GLUCOSE 128* 126*  BUN 40* 63*  CREATININE 1.90* 1.90*  CALCIUM 9.2  --     GFR: CrCl cannot be calculated (Unknown ideal weight.).  Liver Function Tests: Recent Labs  Lab 06/25/20 1700  AST 63*  ALT 20  ALKPHOS 65  BILITOT 0.4  PROT 7.9  ALBUMIN 3.7    Urine analysis: No results found for: COLORURINE, APPEARANCEUR, LABSPEC, PHURINE, GLUCOSEU, HGBUR, BILIRUBINUR, KETONESUR, PROTEINUR, UROBILINOGEN, NITRITE, LEUKOCYTESUR  Radiological Exams on Admission: CT HEAD WO CONTRAST  Result Date: 06/25/2020 CLINICAL DATA:  Neuro deficit, acute, stroke suspected Left-sided weakness, fall. EXAM: CT HEAD WITHOUT CONTRAST TECHNIQUE: Contiguous axial images were obtained from the base of the skull through the vertex without intravenous contrast. COMPARISON:  Remote head CT 10/08/2010 FINDINGS: Brain: Brain volume is normal for age. There is a lacunar infarct in the right caudate as well as the right cerebellum, new from 2012 but age indeterminate. No evidence of large or territorial ischemia. No intracranial hemorrhage, mass effect, or midline shift. No hydrocephalus. The basilar cisterns are patent. No extra-axial or intracranial fluid collection. Vascular: Atherosclerosis of skullbase vasculature without hyperdense vessel or abnormal calcification. Skull: No fracture or focal lesion. Sinuses/Orbits: Mucosal thickening with bubbly debris and associated adjacent sclerosis of the left sphenoid sinus consistent chronic sinusitis. Remaining paranasal sinuses are clear. No mastoid effusion. Included orbits are unremarkable. Other: None. IMPRESSION: 1. Lacunar infarcts in the right caudate and right cerebellum, new from 2012, but age  indeterminate. 2. No hemorrhage or evidence of large or territorial ischemia. 3. Chronic left sphenoid sinusitis. Electronically Signed   By: Keith Rake M.D.   On: 06/25/2020 17:43    EKG: Independently reviewed.  Sinus rhythm, 73 bpm RSR prime in aVF with baseline wander throughout.  Assessment/Plan Principal Problem:   CVA (cerebral vascular accident) ([place]) Active Problems:   Interstitial lung disease (HCC)   COPD (chronic obstructive pulmonary disease) (HCC)   GERD (gastroesophageal reflux disease)   MDD (major depressive disorder), recurrent severe, without psychosis ([place])  CVA Hyperlipidemia > One day of L sided weakness and had a fall day prior to admission > Also noting some left upper extremity weakness and some numbness and tingling left side of her face > CT head with right sided lacunar infarcts, age indeterminant.  No hemorrhage. - Neurology consulted in ED - Allow for permissive HTN (systolic < 622 and diastolic < 633) - Continue ASA 81 mg daily  - Continue Fenofribrate and Vescepa - Echocardiogram  - Carotid doppler - A1C  - Lipid panel  - Tele monitoring  - SLP eval - PT/OT  AKI > Creatinine elevated to 1.9 from baseline of 1.4 - Avoid nephrotoxic agents - Trend renal function electrolytes - Maintenance IV fluids overnight  COPD ILD - Continue home pirfenidone - Continue home Spiriva - Replace home Symbicort with formulary Dulera - As needed albuterol  Hypertension - Hold home antihypertensives in the setting of need for permissive hypertension  GERD - Continue PPI  Depression with history of suicidal attempt - Not currently on Zoloft that she is not sure what she is on may be BuSpar will need to complete med rec   Diabetes > On Toujeo 50 units in a.m. and 40 units in the p.m. at home - Lantus 25 units in a.m. and 25 units in p.m.  DVT prophylaxis: Lovenox  Code Status:   Full  Family Communication:  Sister, [name] updated at bedside.   States she would like to be kept in the loop. Disposition Plan:   Patient is from:  Home  Anticipated DC to:  Pending work-up  Anticipated DC date:  1 to 2 days  Anticipated DC barriers: None  Consults called:  Neurology consulted in ED  Admission status:  Observation, telemetry   Severity of Illness: The appropriate patient status for this patient is OBSERVATION. Observation status is judged to be reasonable and necessary in order to provide the required intensity of service to ensure the patient's safety. The patient's presenting symptoms, physical exam findings, and initial radiographic and laboratory data in the context of their medical condition is felt to place them at decreased risk for further clinical deterioration. Furthermore, it is anticipated that the patient will be medically stable for discharge from the hospital within 2 midnights of admission. The following factors support the patient status of observation.   " The patient's presenting symptoms include left-sided weakness, fall. " The physical exam findings include left-sided weakness, abnormal sensation face. " The initial radiographic and laboratory data are creatinine elevated to 1.9 from baseline of 1.4.  Right-sided lacunar infarcts on CT.    Marcelyn Bruins MD [facility]  How to contact the [facility] Attending or Consulting provider [place] or covering provider during after hours [place], for this patient?   1. Check the care team in [facility] and look for a) attending/consulting [facility] provider listed and b) the [facility] team listed 2. Log into [email or web address] and use [place]'s universal password to access. If you do not have the password, please contact the hospital operator. 3. Locate the [facility] provider you are looking for under [facility] and page to a number that you can be directly reached. 4. If you still have difficulty reaching the provider, please page the [facility] (Director on Call) for the Hospitalists listed on  amion for assistance.  06/25/2020, 8:19 PM

## 2020-06-25 NOTE — ED Notes (Signed)
Pt transported to MRI

## 2020-06-25 NOTE — ED Triage Notes (Signed)
Pt came by EMS from PCP office. LSN 2100 1/12.  Left sided weakness caused pt to fall on 1/12. Pt has a hx of hypertension but has not taken medication for it today. Pt is not on blood thinners at this time.   EMS VS  BP 168/102 HR-70 O2-100% RR-18  BS-152

## 2020-06-26 ENCOUNTER — Observation Stay (HOSPITAL_COMMUNITY): Payer: Medicare HMO

## 2020-06-26 ENCOUNTER — Encounter (HOSPITAL_COMMUNITY): Payer: Self-pay | Admitting: Internal Medicine

## 2020-06-26 ENCOUNTER — Observation Stay (HOSPITAL_BASED_OUTPATIENT_CLINIC_OR_DEPARTMENT_OTHER): Payer: Medicare HMO

## 2020-06-26 DIAGNOSIS — M19012 Primary osteoarthritis, left shoulder: Secondary | ICD-10-CM | POA: Diagnosis present

## 2020-06-26 DIAGNOSIS — D61818 Other pancytopenia: Secondary | ICD-10-CM | POA: Diagnosis not present

## 2020-06-26 DIAGNOSIS — I6389 Other cerebral infarction: Secondary | ICD-10-CM | POA: Diagnosis not present

## 2020-06-26 DIAGNOSIS — I6320 Cerebral infarction due to unspecified occlusion or stenosis of unspecified precerebral arteries: Secondary | ICD-10-CM | POA: Diagnosis not present

## 2020-06-26 DIAGNOSIS — I129 Hypertensive chronic kidney disease with stage 1 through stage 4 chronic kidney disease, or unspecified chronic kidney disease: Secondary | ICD-10-CM | POA: Diagnosis present

## 2020-06-26 DIAGNOSIS — J410 Simple chronic bronchitis: Secondary | ICD-10-CM | POA: Diagnosis not present

## 2020-06-26 DIAGNOSIS — D649 Anemia, unspecified: Secondary | ICD-10-CM | POA: Diagnosis not present

## 2020-06-26 DIAGNOSIS — Z9151 Personal history of suicidal behavior: Secondary | ICD-10-CM | POA: Diagnosis not present

## 2020-06-26 DIAGNOSIS — R292 Abnormal reflex: Secondary | ICD-10-CM | POA: Diagnosis present

## 2020-06-26 DIAGNOSIS — I6381 Other cerebral infarction due to occlusion or stenosis of small artery: Secondary | ICD-10-CM | POA: Diagnosis present

## 2020-06-26 DIAGNOSIS — Z20822 Contact with and (suspected) exposure to covid-19: Secondary | ICD-10-CM | POA: Diagnosis present

## 2020-06-26 DIAGNOSIS — J449 Chronic obstructive pulmonary disease, unspecified: Secondary | ICD-10-CM | POA: Diagnosis present

## 2020-06-26 DIAGNOSIS — E1142 Type 2 diabetes mellitus with diabetic polyneuropathy: Secondary | ICD-10-CM | POA: Diagnosis not present

## 2020-06-26 DIAGNOSIS — K219 Gastro-esophageal reflux disease without esophagitis: Secondary | ICD-10-CM | POA: Diagnosis present

## 2020-06-26 DIAGNOSIS — I639 Cerebral infarction, unspecified: Secondary | ICD-10-CM | POA: Diagnosis present

## 2020-06-26 DIAGNOSIS — J849 Interstitial pulmonary disease, unspecified: Secondary | ICD-10-CM | POA: Diagnosis present

## 2020-06-26 DIAGNOSIS — N1832 Chronic kidney disease, stage 3b: Secondary | ICD-10-CM | POA: Diagnosis not present

## 2020-06-26 DIAGNOSIS — Z85048 Personal history of other malignant neoplasm of rectum, rectosigmoid junction, and anus: Secondary | ICD-10-CM | POA: Diagnosis not present

## 2020-06-26 DIAGNOSIS — E1169 Type 2 diabetes mellitus with other specified complication: Secondary | ICD-10-CM | POA: Diagnosis not present

## 2020-06-26 DIAGNOSIS — N179 Acute kidney failure, unspecified: Secondary | ICD-10-CM | POA: Diagnosis present

## 2020-06-26 DIAGNOSIS — I10 Essential (primary) hypertension: Secondary | ICD-10-CM | POA: Diagnosis not present

## 2020-06-26 DIAGNOSIS — I63331 Cerebral infarction due to thrombosis of right posterior cerebral artery: Secondary | ICD-10-CM | POA: Diagnosis not present

## 2020-06-26 DIAGNOSIS — R29707 NIHSS score 7: Secondary | ICD-10-CM | POA: Diagnosis present

## 2020-06-26 DIAGNOSIS — Z6834 Body mass index (BMI) 34.0-34.9, adult: Secondary | ICD-10-CM | POA: Diagnosis not present

## 2020-06-26 DIAGNOSIS — G4700 Insomnia, unspecified: Secondary | ICD-10-CM

## 2020-06-26 DIAGNOSIS — R4781 Slurred speech: Secondary | ICD-10-CM | POA: Diagnosis present

## 2020-06-26 DIAGNOSIS — M19011 Primary osteoarthritis, right shoulder: Secondary | ICD-10-CM | POA: Diagnosis present

## 2020-06-26 DIAGNOSIS — E669 Obesity, unspecified: Secondary | ICD-10-CM | POA: Diagnosis present

## 2020-06-26 DIAGNOSIS — G8194 Hemiplegia, unspecified affecting left nondominant side: Secondary | ICD-10-CM | POA: Diagnosis present

## 2020-06-26 DIAGNOSIS — E7800 Pure hypercholesterolemia, unspecified: Secondary | ICD-10-CM | POA: Diagnosis not present

## 2020-06-26 DIAGNOSIS — I69993 Ataxia following unspecified cerebrovascular disease: Secondary | ICD-10-CM | POA: Diagnosis not present

## 2020-06-26 DIAGNOSIS — I69354 Hemiplegia and hemiparesis following cerebral infarction affecting left non-dominant side: Secondary | ICD-10-CM | POA: Diagnosis not present

## 2020-06-26 DIAGNOSIS — R7309 Other abnormal glucose: Secondary | ICD-10-CM | POA: Diagnosis not present

## 2020-06-26 DIAGNOSIS — E785 Hyperlipidemia, unspecified: Secondary | ICD-10-CM | POA: Diagnosis present

## 2020-06-26 DIAGNOSIS — R0989 Other specified symptoms and signs involving the circulatory and respiratory systems: Secondary | ICD-10-CM | POA: Diagnosis not present

## 2020-06-26 DIAGNOSIS — E1122 Type 2 diabetes mellitus with diabetic chronic kidney disease: Secondary | ICD-10-CM | POA: Diagnosis present

## 2020-06-26 DIAGNOSIS — R531 Weakness: Secondary | ICD-10-CM | POA: Diagnosis present

## 2020-06-26 DIAGNOSIS — F332 Major depressive disorder, recurrent severe without psychotic features: Secondary | ICD-10-CM | POA: Diagnosis present

## 2020-06-26 DIAGNOSIS — E1140 Type 2 diabetes mellitus with diabetic neuropathy, unspecified: Secondary | ICD-10-CM | POA: Diagnosis present

## 2020-06-26 DIAGNOSIS — Z7951 Long term (current) use of inhaled steroids: Secondary | ICD-10-CM | POA: Diagnosis not present

## 2020-06-26 DIAGNOSIS — N1831 Chronic kidney disease, stage 3a: Secondary | ICD-10-CM | POA: Diagnosis present

## 2020-06-26 LAB — LIPID PANEL
Cholesterol: 149 mg/dL (ref <200)
Cholesterol: 155 mg/dL (ref 0–200)
HDL: 50 mg/dL (ref >=50)
HDL: 43 mg/dL (ref >40)
LDL Cholesterol (Calc): 81 mg/dL (calc)
LDL Cholesterol: 86 mg/dL (ref 0–99)
Non-HDL Cholesterol (Calc): 99 mg/dL (calc) (ref <130)
Total CHOL/HDL Ratio: 3 (calc) (ref <5.0)
Total CHOL/HDL Ratio: 3.6 RATIO
Triglycerides: 101 mg/dL (ref <150)
Triglycerides: 130 mg/dL (ref <150)
VLDL: 26 mg/dL (ref 0–40)

## 2020-06-26 LAB — RAPID URINE DRUG SCREEN, HOSP PERFORMED
Amphetamines: NOT DETECTED
Barbiturates: NOT DETECTED
Benzodiazepines: NOT DETECTED
Cocaine: NOT DETECTED
Opiates: NOT DETECTED
Tetrahydrocannabinol: NOT DETECTED

## 2020-06-26 LAB — CBG MONITORING, ED
Glucose-Capillary: 217 mg/dL — ABNORMAL HIGH (ref 70–99)
Glucose-Capillary: 71 mg/dL (ref 70–99)
Glucose-Capillary: 127 mg/dL — ABNORMAL HIGH (ref 70–99)

## 2020-06-26 LAB — URINALYSIS, ROUTINE W REFLEX MICROSCOPIC
Bacteria, UA: NONE SEEN
Bilirubin Urine: NEGATIVE
Glucose, UA: NEGATIVE mg/dL
Hgb urine dipstick: NEGATIVE
Ketones, ur: NEGATIVE mg/dL
Nitrite: NEGATIVE
Protein, ur: NEGATIVE mg/dL
Specific Gravity, Urine: 1.009 (ref 1.005–1.030)
pH: 5 (ref 5.0–8.0)

## 2020-06-26 LAB — COMPLETE METABOLIC PANEL WITH GFR
AG Ratio: 0.9 (calc) — ABNORMAL LOW (ref 1.0–2.5)
ALT: 12 U/L (ref 6–29)
AST: 20 U/L (ref 10–35)
Albumin: 4 g/dL (ref 3.6–5.1)
Alkaline phosphatase (APISO): 72 U/L (ref 37–153)
BUN/Creatinine Ratio: 23 (calc) — ABNORMAL HIGH (ref 6–22)
BUN: 41 mg/dL — ABNORMAL HIGH (ref 7–25)
CO2: 23 mmol/L (ref 20–32)
Calcium: 9.2 mg/dL (ref 8.6–10.4)
Chloride: 105 mmol/L (ref 98–110)
Creat: 1.79 mg/dL — ABNORMAL HIGH (ref 0.50–0.99)
GFR, Est African American: 34 mL/min/{1.73_m2} — ABNORMAL LOW (ref >=60)
GFR, Est Non African American: 29 mL/min/{1.73_m2} — ABNORMAL LOW (ref >=60)
Globulin: 4.3 g/dL (calc) — ABNORMAL HIGH (ref 1.9–3.7)
Glucose, Bld: 138 mg/dL — ABNORMAL HIGH (ref 65–99)
Potassium: 4.3 mmol/L (ref 3.5–5.3)
Sodium: 137 mmol/L (ref 135–146)
Total Bilirubin: 1.1 mg/dL (ref 0.2–1.2)
Total Protein: 8.3 g/dL — ABNORMAL HIGH (ref 6.1–8.1)

## 2020-06-26 LAB — ECHOCARDIOGRAM COMPLETE
Area-P 1/2: 3.12 cm2
Height: 61 in
S' Lateral: 2.5 cm
Weight: 2955.93 oz

## 2020-06-26 LAB — CBC
HCT: 32 % — ABNORMAL LOW (ref 35.0–45.0)
Hemoglobin: 11.3 g/dL — ABNORMAL LOW (ref 11.7–15.5)
MCH: 32.3 pg (ref 27.0–33.0)
MCHC: 35.3 g/dL (ref 32.0–36.0)
MCV: 91.4 fL (ref 80.0–100.0)
MPV: 10.1 fL (ref 7.5–12.5)
Platelets: 212 10*3/uL (ref 140–400)
RBC: 3.5 10*6/uL — ABNORMAL LOW (ref 3.80–5.10)
RDW: 16.6 % — ABNORMAL HIGH (ref 11.0–15.0)
WBC: 6.2 10*3/uL (ref 3.8–10.8)

## 2020-06-26 LAB — GLUCOSE, CAPILLARY
Glucose-Capillary: 225 mg/dL — ABNORMAL HIGH (ref 70–99)
Glucose-Capillary: 139 mg/dL — ABNORMAL HIGH (ref 70–99)

## 2020-06-26 LAB — HEMOGLOBIN A1C
Hgb A1c MFr Bld: 5.2 % (ref 4.8–5.6)
Mean Plasma Glucose: 102.54 mg/dL

## 2020-06-26 LAB — RPR: RPR Ser Ql: NONREACTIVE

## 2020-06-26 LAB — HIV ANTIBODY (ROUTINE TESTING W REFLEX): HIV Screen 4th Generation wRfx: NONREACTIVE

## 2020-06-26 LAB — TSH
TSH: 3.53 mIU/L (ref 0.40–4.50)
TSH: 3.642 u[IU]/mL (ref 0.350–4.500)

## 2020-06-26 LAB — VITAMIN B12: Vitamin B-12: 874 pg/mL (ref 180–914)

## 2020-06-26 LAB — SARS CORONAVIRUS 2 (TAT 6-24 HRS): SARS Coronavirus 2: NEGATIVE

## 2020-06-26 IMAGING — MR MR CERVICAL SPINE W/O CM
6 series · 32 of 48 positions shown · non-contrast
Comparison: None.

CLINICAL DATA: Myelopathy

EXAM:
MRI CERVICAL SPINE WITHOUT CONTRAST
TECHNIQUE: Multiplanar, multisequence MR imaging of the cervical spine was
performed. No intravenous contrast was administered.

[Series 5: T2 · sagittal · 3.0mm · 0.69mm/px · 4 of 15 slices shown (1 of 2)]
[im 1/15]
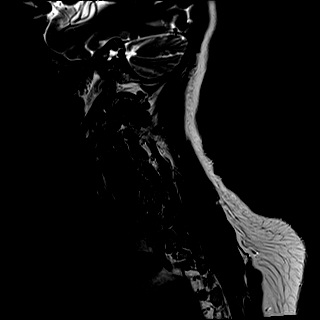
[im 5/15]
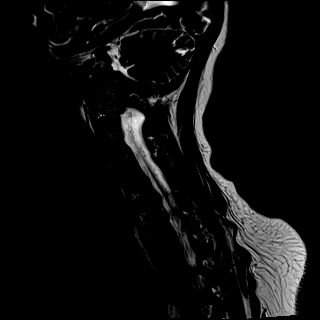
[im 10/15]
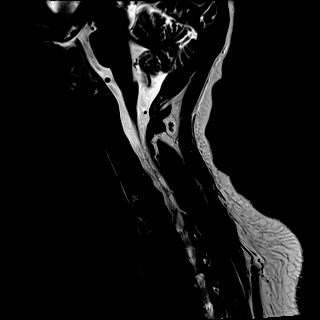
[im 15/15]
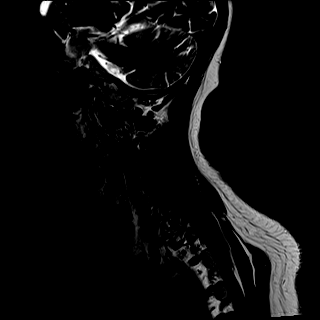

[Series 6: T1 · sagittal · 3.0mm · 0.69mm/px · 4 of 15 slices shown (1 of 2)]
[im 1/15]
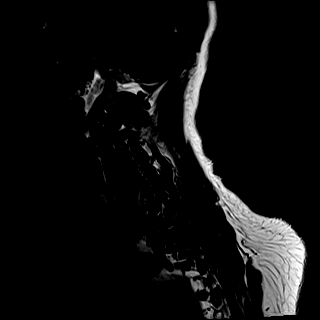
[im 5/15]
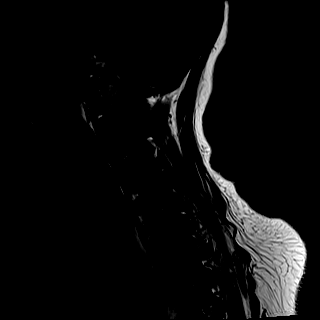
[im 10/15]
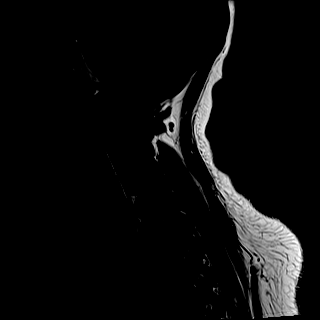
[im 15/15]
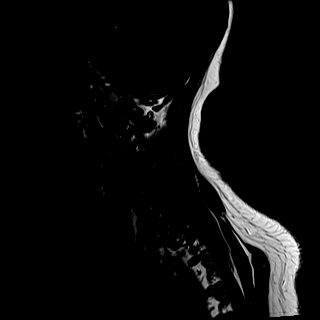

[Series 7: STIR · sagittal · 3.0mm · 0.86mm/px · 4 of 15 slices shown]
[im 1/15]
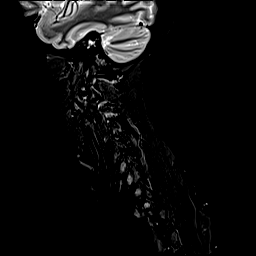
[im 5/15]
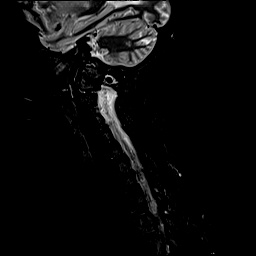
[im 10/15]
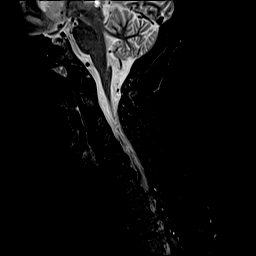
[im 15/15]
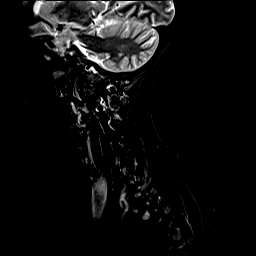

[Series 8: T2 · axial · 3.0mm · 0.66mm/px · z∈[-152,-31]mm · 9 of 40 slices shown (2 of 2)]
[im 1/40]
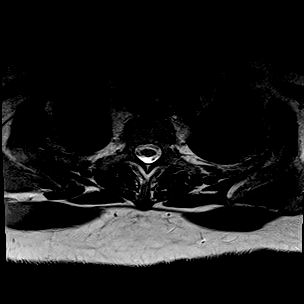
[im 8/40]
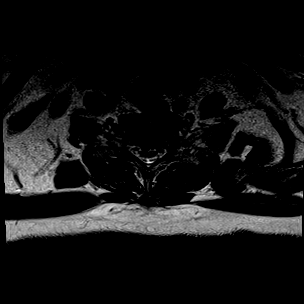
[im 11/40]
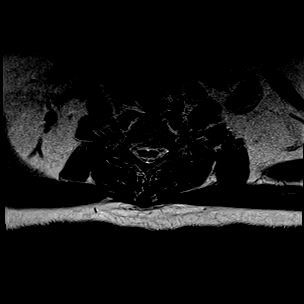
[im 18/40]
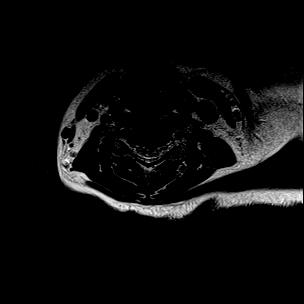
[im 22/40]
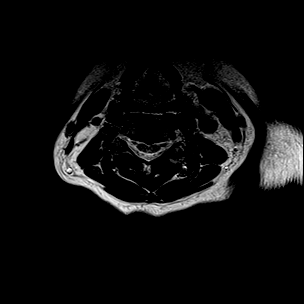
[im 29/40]
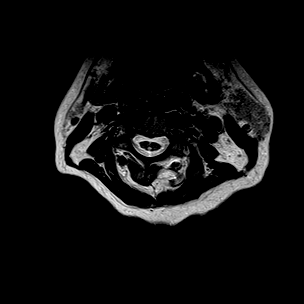
[im 32/40]
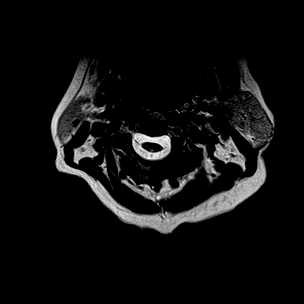
[im 36/40]
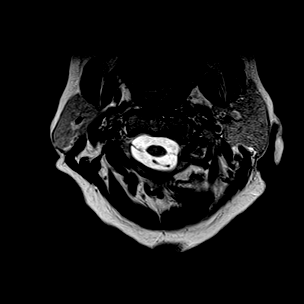
[im 40/40]
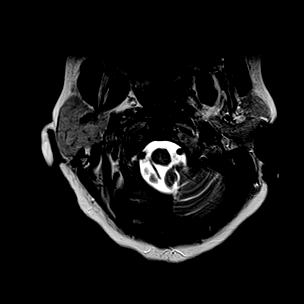

[Series 9: GRE · axial · 3.0mm · 0.39mm/px · z∈[-152,-130]mm · 2 of 40 slices shown]
[im 1/40]
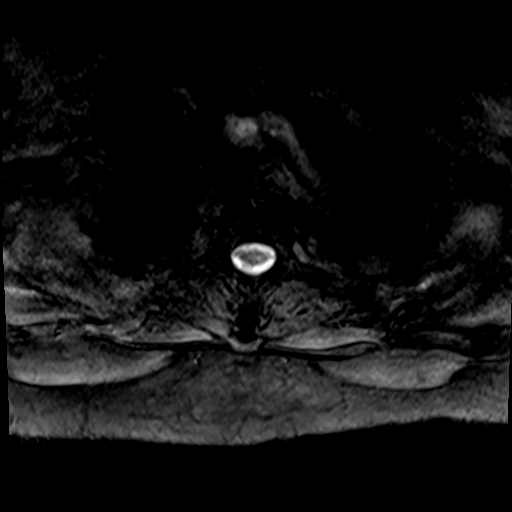
[im 8/40]
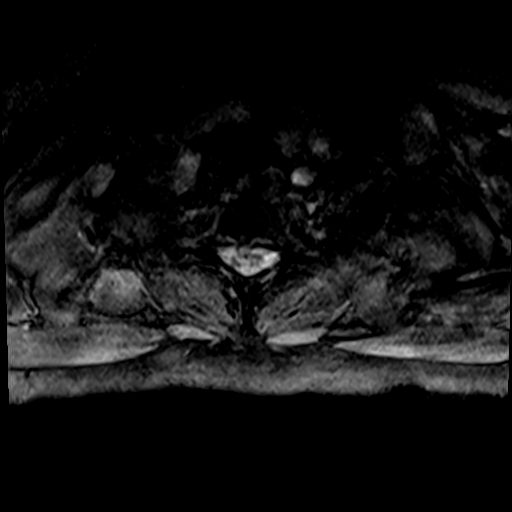

[Series 10: T1 · axial · 3.0mm · 0.39mm/px · z∈[-152,-31]mm · 9 of 40 slices shown (2 of 2)]
[im 1/40]
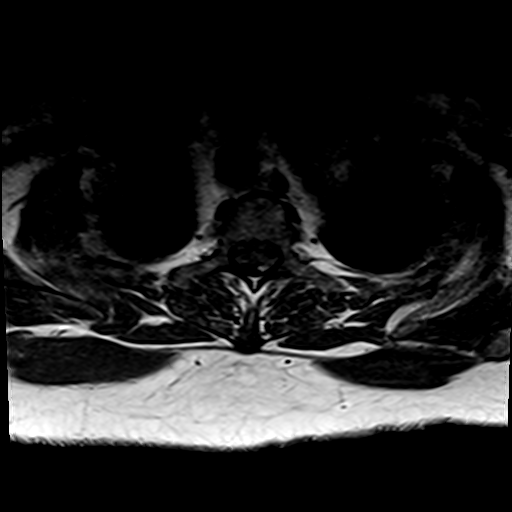
[im 8/40]
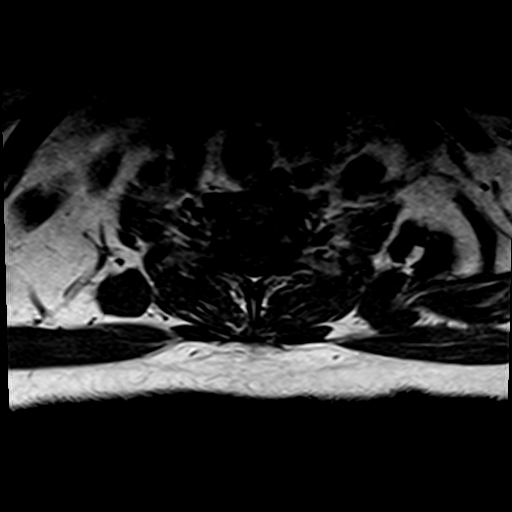
[im 11/40]
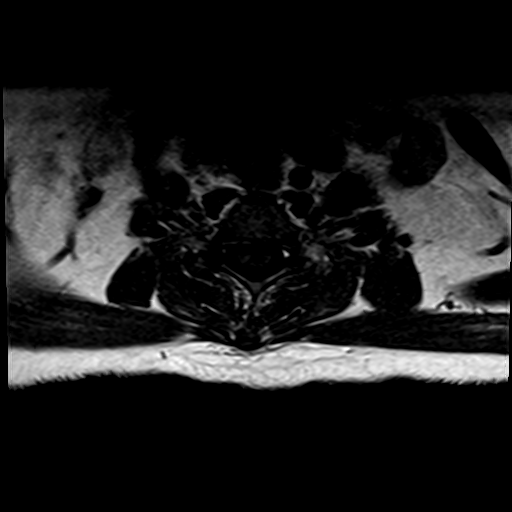
[im 18/40]
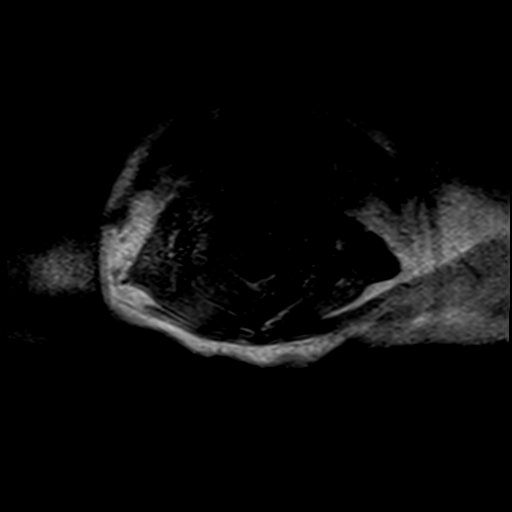
[im 22/40]
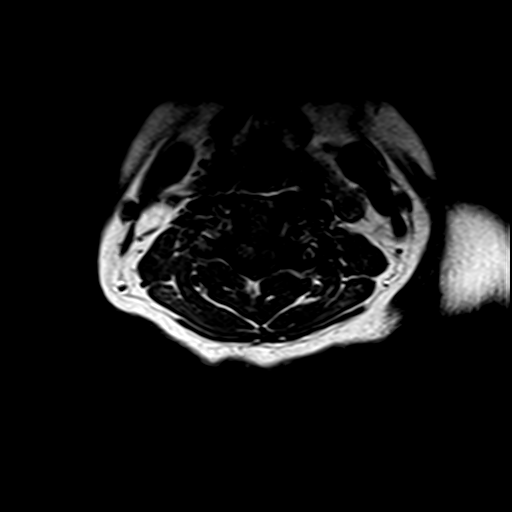
[im 29/40]
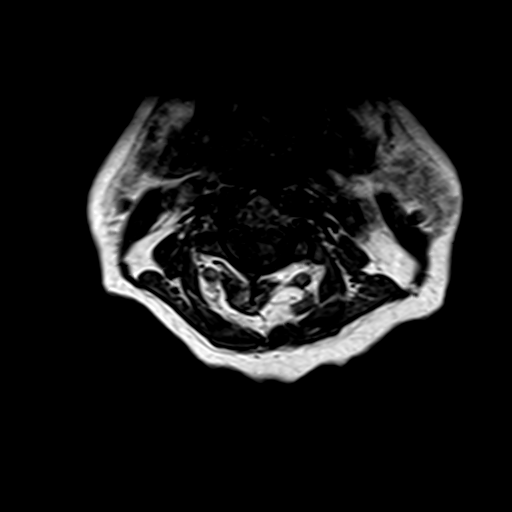
[im 32/40]
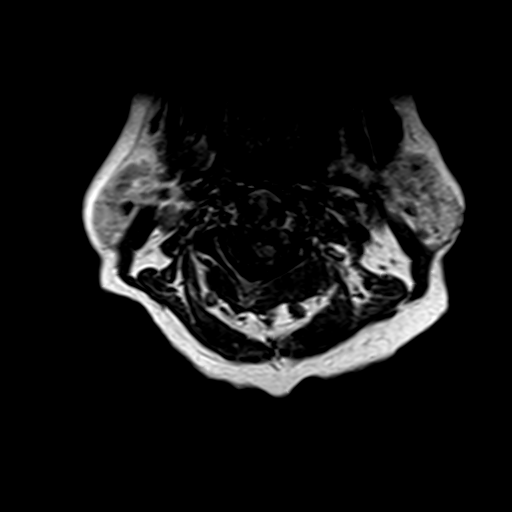
[im 36/40]
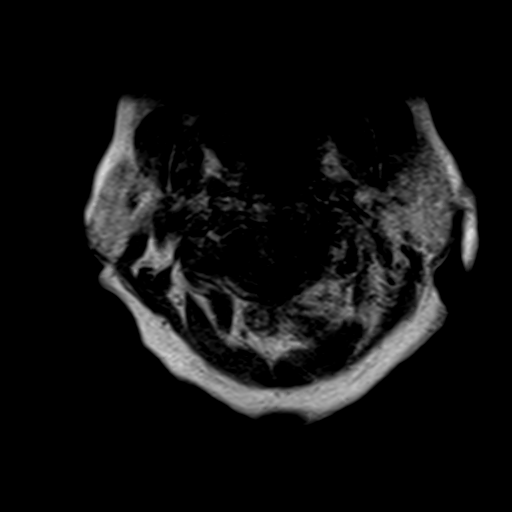
[im 40/40]
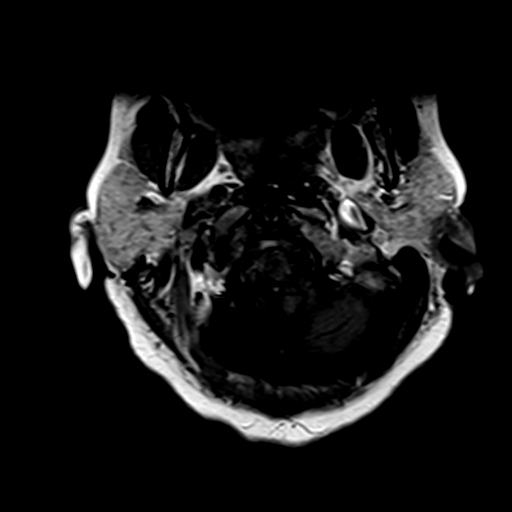

[32 of 48 positions shown; findings below may reference images not displayed]

FINDINGS: Alignment: Reversal of normal cervical lordosis may be positional or
due to muscle spasm. No static subluxation.

Vertebrae: No fracture, evidence of discitis, or bone lesion.

Cord: Mild hydrosyringomyelia measuring 3 mm in greatest transverse
dimension, concentrated within the dorsal spinal cord at the C2-6
levels. The spinal cord is mildly atrophic.

Posterior Fossa, vertebral arteries, paraspinal tissues: Negative.

Disc levels:

C1-2: Unremarkable.

C2-3: Normal disc space and facet joints. There is no spinal canal
stenosis. No neural foraminal stenosis.

C3-4: Normal disc space and facet joints. There is no spinal canal
stenosis. No neural foraminal stenosis.

C4-5: Small central disc protrusion indenting the ventral spinal
cord. There is no spinal canal stenosis. No neural foraminal
stenosis.

C5-6: Intermediate sized central disc protrusion indenting the
ventral spinal cord. Mild spinal canal stenosis. No neural foraminal
stenosis.

C6-7: Intermediate sized central disc protrusion indenting the
ventral spinal cord. Moderate spinal canal stenosis. No neural
foraminal stenosis.

C7-T1: Small central disc extrusion with inferior migration,
contacting the ventral spinal cord. Mild spinal canal stenosis. No
neural foraminal stenosis.
IMPRESSION: 1. Mild hydrosyringomyelia concentrated within the dorsal spinal
cord at the C2-6 levels. Mild generalized atrophy of the upper
cervical spinal cord.
2. Moderate C6-7 and mild C5-6 and C7-T1 spinal canal stenosis
secondary to central disc protrusions.

## 2020-06-26 MED ORDER — CLOPIDOGREL BISULFATE 300 MG PO TABS
300.0000 mg | ORAL_TABLET | Freq: Once | ORAL | Status: AC
  Administered 2020-06-26: 300 mg via ORAL
  Filled 2020-06-26: qty 1

## 2020-06-26 MED ORDER — CLOPIDOGREL BISULFATE 75 MG PO TABS: 75.0000 mg | ORAL_TABLET | Freq: Every day | ORAL | Status: DC

## 2020-06-26 MED ORDER — GABAPENTIN 600 MG PO TABS: 600.0000 mg | ORAL_TABLET | Freq: Three times a day (TID) | ORAL | Status: DC

## 2020-06-26 MED ORDER — CLOPIDOGREL BISULFATE 75 MG PO TABS
75.0000 mg | ORAL_TABLET | Freq: Every day | ORAL | Status: DC
  Administered 2020-06-27 – 2020-06-30 (×4): 75 mg via ORAL
  Filled 2020-06-26 (×4): qty 1

## 2020-06-26 MED ORDER — ATORVASTATIN CALCIUM 10 MG PO TABS
20.0000 mg | ORAL_TABLET | Freq: Every day | ORAL | Status: DC
  Administered 2020-06-26: 20 mg via ORAL
  Filled 2020-06-26: qty 2

## 2020-06-26 MED ORDER — ATORVASTATIN CALCIUM 40 MG PO TABS
40.0000 mg | ORAL_TABLET | Freq: Every day | ORAL | Status: DC
  Administered 2020-06-27 – 2020-06-30 (×4): 40 mg via ORAL
  Filled 2020-06-26 (×4): qty 1

## 2020-06-26 MED ORDER — CLOPIDOGREL BISULFATE 300 MG PO TABS: 300.0000 mg | ORAL_TABLET | Freq: Once | ORAL | Status: DC

## 2020-06-26 MED ORDER — BUSPIRONE HCL 10 MG PO TABS
10.0000 mg | ORAL_TABLET | Freq: Every day | ORAL | Status: DC
  Administered 2020-06-27 – 2020-06-30 (×4): 10 mg via ORAL
  Filled 2020-06-26 (×4): qty 1

## 2020-06-26 MED ORDER — TRAZODONE HCL 50 MG PO TABS
25.0000 mg | ORAL_TABLET | Freq: Every evening | ORAL | Status: AC | PRN
  Administered 2020-06-26 – 2020-06-27 (×2): 50 mg via ORAL
  Filled 2020-06-26 (×2): qty 1

## 2020-06-26 NOTE — ED Notes (Signed)
Lunch Tray Ordered @ [id number].

## 2020-06-26 NOTE — ED Notes (Signed)
Pt to mri

## 2020-06-26 NOTE — Evaluation (Signed)
Physical Therapy Evaluation Patient Details Name: Erica [name] MRN: [id number] DOB: 1955/07/02 Today's Date: 06/26/2020   History of Present Illness  Pt is a 65 y/o female admitted secondary to fall and L sided weakness. 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 PMH includes COPD, MDD with suicide attempt, HTN, CAD, substance abuse and DM.  Clinical Impression  Pt admitted secondary to problem above with deficits below. Pt with weakness and decreased coordination in L extremities. Required manual blocking of LLE and max A +2 to stand and attempt to take steps at edge of stretcher. Pt was previously mod I with use of cane. Feel she would benefit from CIR level therapies to increase independence and safety. Has good family support. Will continue to follow acutely.     Follow Up Recommendations CIR    Equipment Recommendations  Other (comment) (TBD)    Recommendations for Other Services Rehab consult     Precautions / Restrictions Precautions Precautions: Fall Restrictions Weight Bearing Restrictions: No      Mobility  Bed Mobility Overal bed mobility: Needs Assistance Bed Mobility: Supine to Sit;Sit to Supine     Supine to sit: Mod assist;+2 for physical assistance;+2 for safety/equipment Sit to supine: Mod assist;+2 for physical assistance;+2 for safety/equipment   General bed mobility comments: Required assist for trunk and LEs. Increased time on stretcher    Transfers Overall transfer level: Needs assistance Equipment used: Rolling walker (2 wheeled);2 person hand held assist Transfers: Sit to/from Stand Sit to Stand: Max assist;+2 physical assistance         General transfer comment: Assist for lift and steadying to stand and EOB. Used RW, however, pt with increased knee buckling, so transitioned to 2 person HHA. Required manual blocking of LLE. difficulty taking steps at EOB.  Ambulation/Gait                Stairs            Wheelchair  Mobility    Modified Rankin (Stroke Patients Only) Modified Rankin (Stroke Patients Only) Pre-Morbid Rankin Score: No significant disability Modified Rankin: Moderately severe disability     Balance Overall balance assessment: Needs assistance Sitting-balance support: No upper extremity supported Sitting balance-Leahy Scale: Fair     Standing balance support: Bilateral upper extremity supported;During functional activity Standing balance-Leahy Scale: Poor Standing balance comment: Reliant on UE and external support                             Pertinent Vitals/Pain Pain Assessment: 0-10 Pain Score: 6  Pain Location: B knees; headache Pain Descriptors / Indicators: Aching Pain Intervention(s): Limited activity within patient's tolerance;Monitored during session;Repositioned    Home Living Family/patient expects to be discharged to:: Private residence Living Arrangements: Children Available Help at Discharge: Family;Available 24 hours/day (sister checks on her everyday) Type of Home: Apartment Home Access: Stairs to enter Entrance Stairs-Rails: Left Entrance Stairs-Number of Steps: 14 Home Layout: One level Home Equipment: Shower seat;Cane - single point      Prior Function Level of Independence: Independent with assistive device(s)         Comments: did her own medication management. uses cane for mobility     Hand Dominance   Dominant Hand: Right    Extremity/Trunk Assessment   Upper Extremity Assessment Upper Extremity Assessment: Defer to OT evaluation LUE Deficits / Details: Apparent RTC insufficiency at baseline; ROM limited at shoujlder; apparent sensory motor deficits LUE Sensation:  decreased light touch;decreased proprioception LUE Coordination: decreased fine motor;decreased gross motor    Lower Extremity Assessment Lower Extremity Assessment: LLE deficits/detail LLE Deficits / Details: Poor coordination and noted buckling during  stance phase. Pt performed LAQ, but could not hold leg straight. Weakness in ankle DF as well. Grossly 2/5 throughout    Cervical / Trunk Assessment Cervical / Trunk Assessment: Normal  Communication   Communication: No difficulties  Cognition Arousal/Alertness: Awake/alert Behavior During Therapy: WFL for tasks assessed/performed Overall Cognitive Status: Impaired/Different from baseline Area of Impairment: Attention;Awareness;Safety/judgement;Problem solving;Following commands;Memory                   Current Attention Level: Selective Memory: Decreased recall of precautions;Decreased short-term memory Following Commands: Follows one step commands with increased time Safety/Judgement: Decreased awareness of deficits;Decreased awareness of safety Awareness: Emergent Problem Solving: Slow processing General Comments: Impaired awareness o f safety and deficits. Difficulty following instructions to perform visual field tracking. Slow processing      General Comments General comments (skin integrity, edema, etc.): Pt's sister present    Exercises     Assessment/Plan    PT Assessment Patient needs continued PT services  PT Problem List Decreased strength;Decreased activity tolerance;Decreased balance;Decreased mobility;Decreased range of motion;Decreased knowledge of use of DME;Decreased cognition;Decreased safety awareness;Decreased knowledge of precautions       PT Treatment Interventions DME instruction;Gait training;Therapeutic activities;Functional mobility training;Therapeutic exercise;Balance training;Patient/family education;Neuromuscular re-education;Cognitive remediation    PT Goals (Current goals can be found in the Care Plan section)  Acute Rehab PT Goals Patient Stated Goal: to get stronger and be independent PT Goal Formulation: With patient/family Time For Goal Achievement: 07/10/20 Potential to Achieve Goals: Good    Frequency Min 4X/week   Barriers  to discharge        Co-evaluation PT/OT/SLP Co-Evaluation/Treatment: Yes Reason for Co-Treatment: For patient/therapist safety;To address functional/ADL transfers PT goals addressed during session: Mobility/safety with mobility;Balance;Strengthening/ROM         AM-PAC PT "6 Clicks" Mobility  Outcome Measure Help needed turning from your back to your side while in a flat bed without using bedrails?: A Lot Help needed moving from lying on your back to sitting on the side of a flat bed without using bedrails?: A Lot Help needed moving to and from a bed to a chair (including a wheelchair)?: A Lot Help needed standing up from a chair using your arms (e.g., wheelchair or bedside chair)?: A Lot Help needed to walk in hospital room?: A Lot Help needed climbing 3-5 steps with a railing? : Total 6 Click Score: 11    End of Session Equipment Utilized During Treatment: Gait belt Activity Tolerance: Patient tolerated treatment well Patient left: in bed;with call bell/phone within reach;with nursing/sitter in room;with family/visitor present (MD present; on stretcher in ED) Nurse Communication: Mobility status PT Visit Diagnosis: Hemiplegia and hemiparesis Hemiplegia - Right/Left: Left Hemiplegia - dominant/non-dominant: Non-dominant Hemiplegia - caused by: Cerebral infarction    Time: 4315-4008 PT Time Calculation (min) (ACUTE ONLY): 24 min   Charges:   PT Evaluation $PT Eval Moderate Complexity: 1 Mod          Reuel Derby, PT, DPT  Acute Rehabilitation Services  Pager: [phone] Office: [phone]   Rudean Hitt 06/26/2020, 1:45 PM

## 2020-06-26 NOTE — Progress Notes (Addendum)
STROKE TEAM PROGRESS NOTE  HPI per record: Erica [name] is a 65 y.o. right-handed female with a past medical history significant for hypertension, hypercholesterolemia, diabetes complicated by neuropathy, coronary artery disease, depression c/b prior suicide attempt, arthritis, 10-pack-year history of smoking (quit in 2020), COPD/asthma, crack/cocaine use (unclear if continuing). The patient sister reports that the patient has had some slurred speech on and off for the past week.  The patient lives with her son, and that her sister comes intermittently to help her with self-care issues such as her hair as the patient is too weak to keep her arms overhead for long.  Sister also has significant concern that the patient does not have good medication adherence; patient is currently managing her medications herself.The patient was in her baseline state of health (intermittently has blurry vision, has palpitations, stable COPD cough and dyspnea on exertion, stable generalized weakness, and additional left upper extremity weakness secondary to orthopedic issues), other than 2 episodes of feeling faint last week that lasted about 10 minutes and resolved with sitting down. This has not been an issue in the past few days. At about 10 PM on 06/24/2020 she had a fall due to left-sided weakness.  She was unable to get to the bathroom overnight due to this weakness and had 3 episodes of incontinence that she denies any other urinary symptoms or bowel symptoms.  Her sister did come by this morning to take her out for her birthday etc. and given the patient's condition brought her to the ED instead. Notably she was hospitalized 04/2019 for suicidal ideation (had not been taking her medications, U tox was positive for cocaine).  She had an inpatient psychiatric admission at that time.  INTERVAL HISTORY  No acute overnight events.  Patient evaluated at bedside this morning, with sister present in the room.  She states her  left-sided weakness started on Tuesday evening.  She states that she does not feel close to her baseline and her left side feels funny on her face, and left and left leg.  Also notes slurred speech which has improved.  PT was in the room and they suggested to go to inpatient rehabilitation for the patient due to left-sided weakness.  She is informed that she had stroke, she states to help her to feel better.  We will not consider doing loop recorder in this patient due to history of medication noncompliance, but suggest 30-day outpatient monitoring with her cardiologist Dr. Everardo Pacific.  As her RPR and HIV are negative, not planning to do LP but starting Plavix loading today and will continue DAPT for 3 weeks and then Plavix alone.  Blood pressure well controlled.  For details of neurological exam see below.  Updated Dr. Jamse Arn about current plan from stroke team.   Vitals:   06/26/20 0715 06/26/20 0800 06/26/20 0932 06/26/20 1200  BP:  (!) 143/85  (!) 167/102  Pulse: 65 67  63  Resp: (!) 23 (!) 27  14  Temp:   98.4 F (36.9 C) 98.8 F (37.1 C)  TempSrc:   Oral Oral  SpO2: 98% 100%  98%  Weight:      Height:       CBC:  Recent Labs  Lab 06/25/20 1525 06/25/20 1700 06/25/20 1724  WBC 6.2 5.9  --   NEUTROABS  --  4.4  --   HGB 11.3* 11.1* 10.9*  HCT 32.0* 31.3* 32.0*  MCV 91.4 93.4  --   PLT 212 169  --  Basic Metabolic Panel:  Recent Labs  Lab 06/25/20 1525 06/25/20 1700 06/25/20 1724  NA 137 136 138  K 4.3 5.0 5.8*  CL 105 106 109  CO2 23 20*  --   GLUCOSE 138* 128* 126*  BUN 41* 40* 63*  CREATININE 1.79* 1.90* 1.90*  CALCIUM 9.2 9.2  --    Lipid Panel:  Recent Labs  Lab 06/26/20 0527  CHOL 155  TRIG 130  HDL 43  CHOLHDL 3.6  VLDL 26  LDLCALC 86   HgbA1c:  Recent Labs  Lab 06/26/20 0527  HGBA1C 5.2   Urine Drug Screen:  Recent Labs  Lab 06/26/20 0643  LABOPIA NONE DETECTED  COCAINSCRNUR NONE DETECTED  LABBENZ NONE DETECTED  AMPHETMU NONE DETECTED   THCU NONE DETECTED  LABBARB NONE DETECTED    Alcohol Level No results for input(s): ETH in the last 168 hours.  IMAGING past 24 hours CT head wo contrast 06/26/2019  IMPRESSION: 1. Lacunar infarcts in the right caudate and right cerebellum, new from 2012, but age indeterminate. 2. No hemorrhage or evidence of large or territorial ischemia. 3. Chronic left sphenoid sinusitis.  MR Brain wo contrast 06/26/2019  IMPRESSION: 1. 1 cm acute infarction in the anterolateral thalamus on the right, possibly with some involvement of the posterior limb internal capsule. Acute infarction within the right caudate head. Scattered other foci of high signal on diffusion imaging within the hemispheric white matter, not showing restricted diffusion on the ADC map, and therefore probably representing T2 shine through from old white matter infarctions. 2. Extensive old small vessel infarctions throughout the brain as outlined above. Small old cortical infarctions in the right frontal lobe and both parieto-occipital regions. 3. No large or medium vessel occlusion or correctable proximal stenosis. Mild atherosclerotic irregularity of the PCA branches.  MRA head findings 06/26/2019  MRA HEAD FINDINGS  Both internal carotid arteries widely patent through the skull base and siphon regions. No stenosis. The anterior and middle cerebral vessels are normal without evidence of stenosis or large or medium vessel occlusion.  Both vertebral arteries are widely patent to the basilar. Proximal basilar fenestration. No basilar stenosis. Posterior circulation branch vessels are patent. Mild atherosclerotic narrowing and irregularity of the PCA branches.   MR Cervical spine wo contrast 06/27/2019  IMPRESSION: 1. 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 Mild generalized atrophy of the upper cervical spinal cord. 2. Moderate C6-7 and mild C5-6 and C7-T1  spinal canal stenosis secondary to central disc protrusions.  Echocardiogram 06/27/19  IMPRESSIONS    1. Left ventricular ejection fraction, by estimation, is 55 to 60%. The  left ventricle has normal function. The left ventricle has no regional  wall motion abnormalities. Left ventricular diastolic parameters are  indeterminate.  2. Right ventricular systolic function is normal. The right ventricular  size is normal.  3. The mitral valve is normal in structure. No evidence of mitral valve  regurgitation.  4. The aortic valve is tricuspid. Aortic valve regurgitation is trivial.  No aortic stenosis is present.  5. The inferior vena cava is normal in size with greater than 50%  respiratory variability, suggesting right atrial pressure of 3 mmHg.   VAS US Carotid 06/27/2019  Summary:  Right Carotid: Velocities in the right ICA are consistent with a 1-39%  stenosis.   Left Carotid: Velocities in the left ICA are consistent with a 1-39%  stenosis.   Vertebrals: Bilateral vertebral arteries demonstrate antegrade flow.  PHYSICAL EXAM General: Well-developed, well-nourished female lying comfortably in bed, NAD HEENT: [place]/AT, PERRLA, MMM Cardiovascular: Normal rate and regular rhythm Respiratory: No respiratory distress, breathing on room air comfortably GI: Soft, nondistended, no rigidity no tenderness Extremities: No peripheral edema Neurological:  Mental Status: Alert, oriented, thought content appropriate.  Speech fluent without evidence of aphasia.  Able to follow 3 step commands without difficulty. Cranial Nerves: II:  Visual fields grossly normal, pupils equal, round, reactive to light and accommodation III,IV, VI: ptosis not present, extra-ocular motions intact bilaterally V,VII: smile symmetric, reduced facial light touch sensation on left. VIII: hearing normal bilaterally IX,X: uvula rises symmetrically XI: bilateral shoulder shrug XII: midline tongue  extension without atrophy or fasciculations  Motor: Right : Upper extremity   5/5    Left:     Upper extremity   4/5  Lower extremity   5/5     Lower extremity   4/5 Tone and bulk:normal tone throughout; no atrophy noted Sensory: Reduced light touch sensation on left side( if 100% on Right, 40% on left) Deep Tendon Reflexes:  Right: Upper Extremity   Left: Upper extremity   biceps (C-5 to C-6) 2/4   biceps (C-5 to C-6) 2/4 tricep (C7) 2/4    triceps (C7) 2/4 Brachioradialis (C6) 2/4  Brachioradialis (C6) 2/4  Lower Extremity Lower Extremity  quadriceps (L-2 to L-4) 2/4   quadriceps (L-2 to L-4) 2/4 Achilles (S1) 2/4   Achilles (S1) 2/4  Plantars: Right: downgoing   Left: downgoing Cerebellar: normal finger-to-nose,  normal heel-to-shin test Gait: Left sided weakness.   ASSESSMENT/PLAN Erica [name] is a 65 y.o. female with history of hypertension, hypercholesteremia, diabetes complicated by neuropathy, CAD, depression C/V prior suicide attempt, arthritis, 10-pack-year history of smoking quit in 2020, COPD/asthma, polysubstance abuse presented with slurred speech on and off for past week and left leg weakness.  Did not receive tPA, due to out of window.  MRI shows 1 cm acute infarction in anterolateral thalamus on the right, possibly with some involvement of posterior limb internal capsule and acute infarction within the right caudate head.  Stroke: Lacunar infarcts in right caudate head and right anterior lateral thalamus, likely due to small vessel disease. However, given two separate location will need to rule out emoblic source  CT head: Lacunar infarcts in the right caudate and right cerebellum, new from 2012, but age indeterminate.  MRI : 1 cm acute infarction in the anterolateral thalamus on the right, possibly with some involvement of the posterior limb internal capsule. Acute infarction within the right caudate head.   MRA : Both internal carotid arteries widely patent  through the skull base and siphon regions. No stenosis.   Carotid Doppler: Velocities in the left and right ICA are consistent with a 1-39%  stenosis.   2D Echo: Left ventricular ejection fraction, by estimation, is 55 to 60%.   30 day cardiac event monitoring as outpt to rule out afib  LDL 86  HgbA1c 5.2  RPR/HIV neg  VTE prophylaxis -Lovenox 30 mg  aspirin 81 mg daily prior to admission, now on aspirin 81 mg daily and Plavix DAPT for 3 weeks, then Plavix 75 mg alone.   Therapy recommendations: CIR  Disposition: Pending  Hypertension  Home meds: Amlodipine 10 mg, hydrochlorothiazide 12.5 mg, losartan 50 mg, metoprolol 50 mg  Stable . Gradually normalize BP in 2-3 days . Long-term BP goal normotensive  Hyperlipidemia  Home meds: Fenofibrate 145 mg, Vascepa, Lipitor 20 mg  LDL 86,  goal < 70  Increase Lipitor from 20 to 40  Continue statin at discharge  Diabetes type II Controlled   HgbA1c 5.2, goal < 7.0  On Toujeo 50 units in a.m. and 40 units in the p.m. 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 Please call with questions. Pt will follow up with stroke clinic NP at [facility] in about 4 weeks. Thanks for the consult.   Rosalin Hawking, MD PhD Stroke Neurology 06/26/2020 5:08 PM      To contact Stroke Continuity provider, please refer to [email or web address] After hours, contact General Neurology

## 2020-06-26 NOTE — Progress Notes (Signed)
  Echocardiogram 2D Echocardiogram has been performed.  Erica [name] G Erica [name] 06/26/2020, 9:21 AM

## 2020-06-26 NOTE — Progress Notes (Signed)
Inpatient Rehab Admissions Coordinator Note:   Per therapy recommendations, pt was screened for CIR candidacy by Shann Medal, PT, DPT.  Noted pt is observation status at this time. Pt may not have the medical necessity to warrant an inpatient rehab stay if they remain observation. If pt were to qualify for inpatient status, pt appears to qualify for CIR and we request that attending place an order for IP Rehab MD at that time.  Please contact me with questions.   Shann Medal, PT, DPT [phone] 06/26/20 2:08 PM

## 2020-06-26 NOTE — Progress Notes (Signed)
Occupational therapy Evaluation  PTA pt lives with son and is independent with mobility and ADL. Family assists with IADL tasks as needed. Pt demonstrates a significant decline in functional status requiring Max A +2 with limited mobility and Max A with LB ADL. Mobility limited this date by complaints of B knee pain from fall. May benefit from use of pain patches to help progress mobility. Recommend rehab at CIR to maximize functional level of independence. Will follow acutely.    06/26/20 1347  OT Visit Information  Last OT Received On 06/26/20  Assistance Needed +2  PT/OT/SLP Co-Evaluation/Treatment Yes  Reason for Co-Treatment Necessary to address cognition/behavior during functional activity;To address functional/ADL transfers  OT goals addressed during session ADL's and self-care  History of Present Illness Pt is a 65 y/o female admitted secondary to fall and L sided weakness. 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 PMH includes COPD, MDD with suicide attempt, HTN, CAD, substance abuse and DM.  Precautions  Precautions Fall  Restrictions  Weight Bearing Restrictions No  Home Living  Family/patient expects to be discharged to: Private residence  Living Arrangements Children  Available Help at Discharge Family;Available 24 hours/day (sister checks on her everyday)  Type of [place] to enter  Entrance Stairs-Number of Steps 14  Entrance Stairs-Rails Left  Home Layout One level  Bathroom Shower/Tub Tub/shower unit;Curtain  Bathroom Toilet Handicapped height  Bathroom Accessibility Yes  How Accessible Accessible via walker  [place] seat;Cane - single point  Prior Function  Level of Independence Independent with assistive device(s)  Comments did her own medication management. uses cane for mobility  Communication  Communication No difficulties  Pain Assessment  Pain Location B knees; headache  Pain Descriptors / Indicators Aching   Cognition  Arousal/Alertness Awake/alert  Behavior During Therapy WFL for tasks assessed/performed  Overall Cognitive Status Impaired/Different from baseline  Area of Impairment Attention;Awareness;Safety/judgement;Problem solving;Following commands;Memory  Current Attention Level Selective  Memory Decreased recall of precautions;Decreased short-term memory  Following Commands Follows one step commands with increased time  Safety/Judgement Decreased awareness of deficits;Decreased awareness of safety  Awareness Emergent  Problem Solving Slow processing  General Comments Impaired awareness of safety and deficits. Difficulty following instructions to perform visual field tracking. Slow processing  Upper Extremity Assessment  Upper Extremity Assessment LUE deficits/detail  LUE Deficits / Details Apparent RTC insufficiency at baseline; ROM limited at shoujlder; apparent sensory motor deficits; able to touch finger to nose with increased time. poor inhand manipulation skills although able to complete full grip and oppose thumb to fingers  LUE Sensation decreased light touch;decreased proprioception  LUE Coordination decreased fine motor;decreased gross motor  Lower Extremity Assessment  Lower Extremity Assessment Defer to PT evaluation  LLE Deficits / Details L knee buckling  Cervical / Trunk Assessment  Cervical / Trunk Assessment Other exceptions (posterior bias)  ADL  Overall ADL's  Needs assistance/impaired  Eating/Feeding Set up  Grooming Minimal assistance;Sitting  Upper Body Bathing Minimal assistance;Sitting  Lower Body Bathing Maximal assistance;Sit to/from stand  Upper Body Dressing  Moderate assistance;Sitting  Lower Body Dressing Maximal assistance;Sit to/from Control and instrumentation engineer for physical assistance (simulated)  Toileting- Clothing Manipulation and Hygiene Maximal assistance  Functional mobility during ADLs Maximal assistance;+2 for physical  assistance  General ADL Comments fear of falling  Vision- History  Baseline Vision/History Wears glasses  Wears Glasses At all times  Patient Visual Report No change from baseline  Vision- Assessment  Vision Assessment?  Vision impaired- to be further tested in functional context;Yes  Eye Alignment WFL  Ocular Range of Motion [facility]  Alignment/Gaze Preference WDL  Tracking/Visual Pursuits Decreased smoothness of horizontal tracking;Decreased smoothness of vertical tracking  Saccades Additional eye shifts occurred during testing  Visual Fields No apparent deficits  Additional Comments will further assess  Praxis  Praxis tested?  (sensory motor deficits LUE)  Bed Mobility  Overal bed mobility Needs Assistance  Bed Mobility Supine to Sit;Sit to Supine  Supine to sit Mod assist;+2 for physical assistance;+2 for safety/equipment  Sit to supine Mod assist;+2 for physical assistance;+2 for safety/equipment  General bed mobility comments Required assist for trunk and LEs. Increased time on stretcher  Transfers  Overall transfer level Needs assistance  Equipment used Rolling walker (2 wheeled);2 person hand held assist  Transfers Sit to/from Stand  Sit to Stand Max assist;+2 physical assistance  General transfer comment Assist for lift and steadying to stand and EOB. Used RW, however, pt with increased knee buckling, so transitioned to 2 person HHA. Required manual blocking of LLE. difficulty taking steps at EOB.  Balance  Overall balance assessment Needs assistance  Sitting-balance support No upper extremity supported  Sitting balance-Leahy Scale Fair  Standing balance support Bilateral upper extremity supported;During functional activity  Standing balance-Leahy Scale Poor  Standing balance comment Reliant on UE and external support; posterior bias  OT - End of Session  Equipment Utilized During Treatment Gait belt;Rolling walker  Activity Tolerance Patient tolerated treatment well   Patient left in bed;with call bell/phone within reach;with family/visitor present  Nurse Communication Mobility status;Other (comment) (DC needs)  OT Assessment  OT Recommendation/Assessment Patient needs continued OT Services  OT Visit Diagnosis Unsteadiness on feet (R26.81);Other abnormalities of gait and mobility (R26.89);Muscle weakness (generalized) (M62.81);History of falling (Z91.81);Other symptoms and signs involving the nervous system (R29.898);Other symptoms and signs involving cognitive function;Pain;Hemiplegia and hemiparesis  Hemiplegia - Right/Left Left  Hemiplegia - dominant/non-dominant Non-Dominant  Hemiplegia - caused by Cerebral infarction  Pain - Right/Left  (B)  Pain - part of body Knee  OT Problem List Decreased strength;Decreased range of motion;Decreased activity tolerance;Impaired balance (sitting and/or standing);Impaired vision/perception;Decreased coordination;Decreased cognition;Decreased safety awareness;Decreased knowledge of use of DME or AE;Decreased knowledge of precautions;Impaired sensation;Impaired tone;Impaired UE functional use;Pain  OT Plan  OT Frequency (ACUTE ONLY) Min 2X/week  OT Treatment/Interventions (ACUTE ONLY) Self-care/ADL training;Therapeutic exercise;Neuromuscular education;DME and/or AE instruction;Therapeutic activities;Cognitive remediation/compensation;Visual/perceptual remediation/compensation;Patient/family education;Balance training  AM-PAC OT "6 Clicks" Daily Activity Outcome Measure (Version 2)  Help from another person eating meals? 3  Help from another person taking care of personal grooming? 3  Help from another person toileting, which includes using toliet, bedpan, or urinal? 2  Help from another person bathing (including washing, rinsing, drying)? 2  Help from another person to put on and taking off regular upper body clothing? 2  Help from another person to put on and taking off regular lower body clothing? 2  6 Click Score  14  OT Recommendation  Recommendations for Other Services Rehab consult  Follow Up Recommendations CIR;Supervision/Assistance - 24 hour  OT Equipment 3 in 1 bedside commode  Individuals Consulted  Consulted and Agree with Results and Recommendations Patient;Family member/caregiver  Family Member Consulted sister  Acute Rehab OT Goals  Patient Stated Goal to get stronger and be independent  OT Goal Formulation With patient/family  Time For Goal Achievement 07/10/20  Potential to Achieve Goals Good  OT Time Calculation  OT Start Time (ACUTE ONLY) 1015  OT Stop Time (  ACUTE ONLY) 1039  OT Time Calculation (min) 24 min  OT General Charges  $OT Visit 1 Visit  OT Evaluation  $OT Eval Moderate Complexity 1 Mod  Written Expression  Dominant Hand Right  Maurie Boettcher, OT/L   Acute OT Clinical Specialist Acute Rehabilitation Services Pager [phone] Office [phone]

## 2020-06-26 NOTE — Progress Notes (Signed)
Carotid duplex has been completed.   Preliminary results in CV Proc.   Erica [name] 06/26/2020 9:48 AM

## 2020-06-26 NOTE — ED Notes (Signed)
Pt assisted to bedside commode

## 2020-06-26 NOTE — Progress Notes (Signed)
TRH night shift.  The patient requested something to help her sleep.  Trazodone 25 to 50 mg p.o. nightly as needed ordered.  Tennis Must, MD

## 2020-06-26 NOTE — Progress Notes (Signed)
PROGRESS NOTE    MARRION [name]  WRU:[id number]  DOB: 11/21/1955  DOA: 06/25/2020 PCP: Nolene Ebbs, MD Outpatient Specialists:   Hospital course:  Erica [name] is a 65 y.o. right-handed female with a past medical history significant for hypertension, hypercholesterolemia, diabetes complicated by neuropathy, coronary artery disease, depression c/b prior suicide attempt, arthritis, 10-pack-year history of smoking (quit in 2020), COPD/asthma, crack/cocaine use (unclear if continuing) who has had 1 week of slurred speech and 1 day of left-sided weakness yesterday worse in her lower extremity resulting in a fall.  She went to see her PCP who referred her for ED to rule out stroke.  Work-up in the ED was notable for multifocal strokes in the right caudate and right thalamus, thought to be cardioembolic versus aortic arch atheroembolic.  Subjective:  Patient herself states she feels okay.  Attentive sister is at bedside who is relieved that she is getting some attention and care, she has been worried about her for a while.   Objective: Vitals:   06/26/20 0800 06/26/20 0932 06/26/20 1200 06/26/20 1600  BP: (!) 143/85  (!) 167/102 (!) 168/90  Pulse: 67  63 72  Resp: (!) _0 Temp:  98.4 F (36.9 C) 98.8 F (37.1 C)   TempSrc:  Oral Oral   SpO2: 100%  98% 99%  Weight:      Height:        Intake/Output Summary (Last 24 hours) at 06/26/2020 1620 Last data filed at 06/26/2020 1041 Gross per 24 hour  Intake 1000 ml  Output --  Net 1000 ml   Filed Weights   06/25/20 2200  Weight: 83.8 kg     Exam:  General: Well groomed female looking older than stated age lying in bed in no acute distress Eyes: sclera anicteric, conjuctiva mild injection bilaterally CVS: S1-S2, regular  Respiratory:  decreased air entry bilaterally secondary to decreased inspiratory effort, rales at bases  GI: NABS, soft, NT  LE: No edema.  Neuro: Per neuro evaluation Psych: patient is logical and  coherent, judgement and insight appear normal.  Assessment & Plan:   CVA With acute stroke, followed by neurology She was loaded with Plavix and will continue 75 mg daily Was started on aspirin 81 mg daily which will be continued Continue atorvastatin recently on 20 mg, may need to increase to 40 per neurology recommendation PT recommends referral to CIR, CIR referral placed  HTN Permissive hypertension given acute stroke Losartan, metoprolol and amlodipine are being held  COPD/ILD No evidence for acute flare Continue Incruse Ellipta and as needed inhaled bronchodilators Continue Esbriet for treatment of ILD  GERD Continue Protonix daily  Depression with history of suicide attempt Continue BuSpar and Neurontin  Diabetes Hold Toujeo Solostar Continue glargine 25 units and SSI AC at bedtime    DVT prophylaxis: Lovenox Code Status: Full Family Communication: Patient's sister was at bedside throughout Disposition Plan:   Patient is from: Home  Anticipated Discharge Location: CIR  Barriers to Discharge: Completing stroke work-up  Is patient medically stable for Discharge: Yes   Consultants:  Neurology  Procedures:  None  Antimicrobials:  None   Data Reviewed:  Basic Metabolic Panel: Recent Labs  Lab 06/25/20 1525 06/25/20 1700 06/25/20 1724  NA 137 136 138  K 4.3 5.0 5.8*  CL 105 106 109  CO2 23 20*  --   GLUCOSE 138* 128* 126*  BUN 41* 40* 63*  CREATININE 1.79* 1.90* 1.90*  CALCIUM 9.2  9.2  --    Liver Function Tests: Recent Labs  Lab 06/25/20 1525 06/25/20 1700  AST 20 63*  ALT 12 20  ALKPHOS  --  65  BILITOT 1.1 0.4  PROT 8.3* 7.9  ALBUMIN  --  3.7   No results for input(s): LIPASE, AMYLASE in the last 168 hours. No results for input(s): AMMONIA in the last 168 hours. CBC: Recent Labs  Lab 06/25/20 1525 06/25/20 1700 06/25/20 1724  WBC 6.2 5.9  --   NEUTROABS  --  4.4  --   HGB 11.3* 11.1* 10.9*  HCT 32.0* 31.3* 32.0*  MCV  91.4 93.4  --   PLT 212 169  --    Cardiac Enzymes: No results for input(s): CKTOTAL, CKMB, CKMBINDEX, TROPONINI in the last 168 hours. BNP (last 3 results) No results for input(s): PROBNP in the last 8760 hours. CBG: Recent Labs  Lab 06/25/20 1815 06/26/20 0218 06/26/20 0808 06/26/20 1155  GLUCAP 100* 217* 71 127*    Recent Results (from the past 240 hour(s))  SARS CORONAVIRUS 2 (TAT 6-24 HRS) Nasopharyngeal Nasopharyngeal Swab     Status: None   Collection Time: 06/25/20  6:21 PM   Specimen: Nasopharyngeal Swab  Result Value Ref Range Status   SARS Coronavirus 2 NEGATIVE NEGATIVE Final    Comment: (NOTE) SARS-CoV-2 target nucleic acids are NOT DETECTED.  The SARS-CoV-2 RNA is generally detectable in upper and lower respiratory specimens during the acute phase of infection. Negative results do not preclude SARS-CoV-2 infection, do not rule out co-infections with other pathogens, and should not be used as the sole basis for treatment or other patient management decisions. Negative results must be combined with clinical observations, patient history, and epidemiological information. The expected result is Negative.  Fact Sheet for Patients: SugarRoll.be  Fact Sheet for Healthcare Providers: https://www.woods-mathews.com/  This test is not yet approved or cleared by the [place] FDA and  has been authorized for detection and/or diagnosis of SARS-CoV-2 by FDA under an Emergency Use Authorization (EUA). This EUA will remain  in effect (meaning this test can be used) for the duration of the COVID-19 declaration under Se ction 564(b)(1) of the Act, 21 U.S.C. section 360bbb-3(b)(1), unless the authorization is terminated or revoked sooner.  Performed at [facility], [place] [address], [place], [place] [address]       Studies: CT HEAD WO CONTRAST  Result Date: 06/25/2020 CLINICAL DATA:  Neuro deficit, acute, stroke  suspected Left-sided weakness, fall. EXAM: CT HEAD WITHOUT CONTRAST TECHNIQUE: Contiguous axial images were obtained from the base of the skull through the vertex without intravenous contrast. COMPARISON:  Remote head CT 10/08/2010 FINDINGS: Brain: Brain volume is normal for age. There is a lacunar infarct in the right caudate as well as the right cerebellum, new from 2012 but age indeterminate. No evidence of large or territorial ischemia. No intracranial hemorrhage, mass effect, or midline shift. No hydrocephalus. The basilar cisterns are patent. No extra-axial or intracranial fluid collection. Vascular: Atherosclerosis of skullbase vasculature without hyperdense vessel or abnormal calcification. Skull: No fracture or focal lesion. Sinuses/Orbits: Mucosal thickening with bubbly debris and associated adjacent sclerosis of the left sphenoid sinus consistent chronic sinusitis. Remaining paranasal sinuses are clear. No mastoid effusion. Included orbits are unremarkable. Other: None. IMPRESSION: 1. Lacunar infarcts in the right caudate and right cerebellum, new from 2012, but age indeterminate. 2. No hemorrhage or evidence of large or territorial ischemia. 3. Chronic left sphenoid sinusitis. Electronically Signed  By: Keith Rake M.D.   On: 06/25/2020 17:43   MR ANGIO HEAD WO CONTRAST  Result Date: 06/25/2020 CLINICAL DATA:  Neurological deficit.  Acute stroke suspected. EXAM: MRI HEAD WITHOUT CONTRAST MRA HEAD WITHOUT CONTRAST TECHNIQUE: Multiplanar, multiecho pulse sequences of the brain and surrounding structures were obtained without intravenous contrast. Angiographic images of the head were obtained using MRA technique without contrast. COMPARISON:  CT same day FINDINGS: MRI HEAD FINDINGS Brain: Diffusion imaging shows a 1 cm acute infarction in the anterolateral thalamus on the right, possibly with some involvement of the posterior limb internal capsule. Acute infarction within the right caudate head.  Scattered punctate foci of high signal on diffusion imaging within the cerebral hemispheric white matter on both sides. These white matter lesions likely represent T2 shine through within old infarctions of the hemispheric white matter, but do not show low signal on the ADC map. No large confluent infarction. Chronic small-vessel ischemic changes affect the pons. There are numerous old small vessel cerebellar infarctions. Old small vessel infarctions affect both thalami, the basal ganglia regions in the cerebral hemispheric deep and subcortical white matter. Old small cortical infarctions noted in the parieto-occipital regions and right frontal region. No hemorrhage, mass lesion, hydrocephalus or extra-axial collection. Vascular: Major vessels at the base of the brain show flow. Skull and upper cervical spine: Negative Sinuses/Orbits: Clear/normal Other: None MRA HEAD FINDINGS Both internal carotid arteries widely patent through the skull base and siphon regions. No stenosis. The anterior and middle cerebral vessels are normal without evidence of stenosis or large or medium vessel occlusion. Both vertebral arteries are widely patent to the basilar. Proximal basilar fenestration. No basilar stenosis. Posterior circulation branch vessels are patent. Mild atherosclerotic narrowing and irregularity of the PCA branches. IMPRESSION: 1. 1 cm acute infarction in the anterolateral thalamus on the right, possibly with some involvement of the posterior limb internal capsule. Acute infarction within the right caudate head. Scattered other foci of high signal on diffusion imaging within the hemispheric white matter, not showing restricted diffusion on the ADC map, and therefore probably representing T2 shine through from old white matter infarctions. 2. Extensive old small vessel infarctions throughout the brain as outlined above. Small old cortical infarctions in the right frontal lobe and both parieto-occipital regions. 3. No  large or medium vessel occlusion or correctable proximal stenosis. Mild atherosclerotic irregularity of the PCA branches. Electronically Signed   By: Nelson Chimes M.D.   On: 06/25/2020 20:56   MR Brain Wo Contrast (neuro protocol)  Result Date: 06/25/2020 CLINICAL DATA:  Neurological deficit.  Acute stroke suspected. EXAM: MRI HEAD WITHOUT CONTRAST MRA HEAD WITHOUT CONTRAST TECHNIQUE: Multiplanar, multiecho pulse sequences of the brain and surrounding structures were obtained without intravenous contrast. Angiographic images of the head were obtained using MRA technique without contrast. COMPARISON:  CT same day FINDINGS: MRI HEAD FINDINGS Brain: Diffusion imaging shows a 1 cm acute infarction in the anterolateral thalamus on the right, possibly with some involvement of the posterior limb internal capsule. Acute infarction within the right caudate head. Scattered punctate foci of high signal on diffusion imaging within the cerebral hemispheric white matter on both sides. These white matter lesions likely represent T2 shine through within old infarctions of the hemispheric white matter, but do not show low signal on the ADC map. No large confluent infarction. Chronic small-vessel ischemic changes affect the pons. There are numerous old small vessel cerebellar infarctions. Old small vessel infarctions affect both thalami, the basal ganglia regions in  the cerebral hemispheric deep and subcortical white matter. Old small cortical infarctions noted in the parieto-occipital regions and right frontal region. No hemorrhage, mass lesion, hydrocephalus or extra-axial collection. Vascular: Major vessels at the base of the brain show flow. Skull and upper cervical spine: Negative Sinuses/Orbits: Clear/normal Other: None MRA HEAD FINDINGS Both internal carotid arteries widely patent through the skull base and siphon regions. No stenosis. The anterior and middle cerebral vessels are normal without evidence of stenosis or  large or medium vessel occlusion. Both vertebral arteries are widely patent to the basilar. Proximal basilar fenestration. No basilar stenosis. Posterior circulation branch vessels are patent. Mild atherosclerotic narrowing and irregularity of the PCA branches. IMPRESSION: 1. 1 cm acute infarction in the anterolateral thalamus on the right, possibly with some involvement of the posterior limb internal capsule. Acute infarction within the right caudate head. Scattered other foci of high signal on diffusion imaging within the hemispheric white matter, not showing restricted diffusion on the ADC map, and therefore probably representing T2 shine through from old white matter infarctions. 2. Extensive old small vessel infarctions throughout the brain as outlined above. Small old cortical infarctions in the right frontal lobe and both parieto-occipital regions. 3. No large or medium vessel occlusion or correctable proximal stenosis. Mild atherosclerotic irregularity of the PCA branches. Electronically Signed   By: Nelson Chimes M.D.   On: 06/25/2020 20:56   MR CERVICAL SPINE WO CONTRAST  Result Date: 06/26/2020 CLINICAL DATA:  Myelopathy EXAM: MRI CERVICAL SPINE WITHOUT CONTRAST TECHNIQUE: Multiplanar, multisequence MR imaging of the cervical spine was performed. No intravenous contrast was administered. COMPARISON:  None. FINDINGS: Alignment: Reversal of normal cervical lordosis may be positional or due to muscle spasm. No static subluxation. Vertebrae: No fracture, evidence of discitis, or bone lesion. 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 The spinal cord is mildly atrophic. Posterior Fossa, vertebral arteries, paraspinal tissues: Negative. Disc levels: C1-2: Unremarkable. C2-3: Normal disc space and facet joints. There is no spinal canal stenosis. No neural foraminal stenosis. C3-4: Normal disc space and facet  joints. There is no spinal canal stenosis. No neural foraminal stenosis. C4-5: Small central disc protrusion indenting the ventral spinal cord. There is no spinal canal stenosis. No neural foraminal stenosis. C5-6: Intermediate sized central disc protrusion indenting the ventral spinal cord. Mild spinal canal stenosis. No neural foraminal stenosis. C6-7: Intermediate sized central disc protrusion indenting the ventral spinal cord. Moderate spinal canal stenosis. No neural foraminal stenosis. C7-T1: Small central disc extrusion with inferior migration, contacting the ventral spinal cord. Mild spinal canal stenosis. No neural foraminal stenosis. IMPRESSION: 1. 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 Mild generalized atrophy of the upper cervical spinal cord. 2. Moderate C6-7 and mild C5-6 and C7-T1 spinal canal stenosis secondary to central disc protrusions. Electronically Signed   By: Ulyses Jarred M.D.   On: 06/26/2020 03:34   ECHOCARDIOGRAM COMPLETE  Result Date: 06/26/2020    ECHOCARDIOGRAM REPORT   Patient Name:   KAMBREA CARRASCO Date of Exam: 06/26/2020 Medical Rec #:  [id number]    Height:       61.0 in Accession #:    [id number]   Weight:       184.7 lb Date of Birth:  04-10-1956    BSA:          1.826 m Patient Age:    50 years  BP:           143/85 mmHg Patient Gender: F            HR:           68 bpm. Exam Location:  Inpatient Procedure: 2D Echo, Cardiac Doppler and Color Doppler Indications:    Stroke 434.91 / I163.9  History:        Patient has prior history of Echocardiogram examinations, most                 recent 07/16/2019. Risk Factors:Hypertension, Diabetes and GERD.  Sonographer:    Jonelle Sidle Dance Referring Phys: [id number] Dickson  1. Left ventricular ejection fraction, by estimation, is 55 to 60%. The left ventricle has normal function. The left ventricle has no regional wall motion abnormalities. Left ventricular diastolic  parameters are indeterminate.  2. Right ventricular systolic function is normal. The right ventricular size is normal.  3. The mitral valve is normal in structure. No evidence of mitral valve regurgitation.  4. The aortic valve is tricuspid. Aortic valve regurgitation is trivial. No aortic stenosis is present.  5. The inferior vena cava is normal in size with greater than 50% respiratory variability, suggesting right atrial pressure of 3 mmHg. Comparison(s): No prior Echocardiogram. There is no 07/16/19 study available in the system. FINDINGS  Left Ventricle: LV Mass Index 95 g/m2 within reference range; RWT 0.85 concentric remodeling. Left ventricular ejection fraction, by estimation, is 55 to 60%. The left ventricle has normal function. The left ventricle has no regional wall motion abnormalities. The left ventricular internal cavity size was small. There is borderline concentric left ventricular hypertrophy. Left ventricular diastolic parameters are indeterminate. Right Ventricle: The right ventricular size is normal. No increase in right ventricular wall thickness. Right ventricular systolic function is normal. Left Atrium: Left atrial size was normal in size. Right Atrium: Right atrial size was normal in size. Pericardium: There is no evidence of pericardial effusion. Mitral Valve: The mitral valve is normal in structure. No evidence of mitral valve regurgitation. Tricuspid Valve: The tricuspid valve is normal in structure. Tricuspid valve regurgitation is trivial. Aortic Valve: The aortic valve is tricuspid. Aortic valve regurgitation is trivial. No aortic stenosis is present. Pulmonic Valve: The pulmonic valve was not well visualized. Pulmonic valve regurgitation is not visualized. Aorta: The aortic root and ascending aorta are structurally normal, with no evidence of dilitation. Venous: The inferior vena cava is normal in size with greater than 50% respiratory variability, suggesting right atrial pressure of 3  mmHg. IAS/Shunts: The atrial septum is grossly normal.  LEFT VENTRICLE PLAX 2D LVIDd:         3.30 cm  Diastology LVIDs:         2.50 cm  LV e' medial:    3.26 cm/s LV PW:         1.60 cm  LV E/e' medial:  17.6 LV IVS:        1.40 cm  LV e' lateral:   5.00 cm/s LVOT diam:     1.90 cm  LV E/e' lateral: 11.5 LV SV:         49 LV SV Index:   27 LVOT Area:     2.84 cm  RIGHT VENTRICLE             IVC RV Basal diam:  1.90 cm     IVC diam: 1.50 cm RV S prime:     16.20 cm/s TAPSE (M-mode): 2.0  cm LEFT ATRIUM             Index       RIGHT ATRIUM           Index LA diam:        3.90 cm 2.14 cm/m  RA Area:     10.10 cm LA Vol (A2C):   35.3 ml 19.33 ml/m RA Volume:   16.40 ml  8.98 ml/m LA Vol (A4C):   40.0 ml 21.90 ml/m LA Biplane Vol: 38.8 ml 21.25 ml/m  AORTIC VALVE LVOT Vmax:   78.30 cm/s LVOT Vmean:  52.900 cm/s LVOT VTI:    0.172 m  AORTA Ao Root diam: 3.30 cm Ao Asc diam:  3.50 cm MITRAL VALVE MV Area (PHT): 3.12 cm    SHUNTS MV Decel Time: 243 msec    Systemic VTI:  0.17 m MV E velocity: 57.50 cm/s  Systemic Diam: 1.90 cm MV A velocity: 89.30 cm/s MV E/A ratio:  0.64 Rudean Haskell MD Electronically signed by Rudean Haskell MD Signature Date/Time: 06/26/2020/11:56:54 AM    Final    VAS US CAROTID (at [facility] and [facility] only)  Result Date: 06/26/2020 Carotid Arterial Duplex Study Indications:       CVA. Risk Factors:      Hypertension, Diabetes. Comparison Study:  no prior Performing Technologist: Abram Sander RVS  Examination Guidelines: A complete evaluation includes B-mode imaging, spectral Doppler, color Doppler, and power Doppler as needed of all accessible portions of each vessel. Bilateral testing is considered an integral part of a complete examination. Limited examinations for reoccurring indications may be performed as noted.  Right Carotid Findings: +----------+--------+--------+--------+------------------+--------+           PSV cm/sEDV cm/sStenosisPlaque DescriptionComments  +----------+--------+--------+--------+------------------+--------+ CCA Prox  62      12              heterogenous               +----------+--------+--------+--------+------------------+--------+ CCA Distal31      9               heterogenous               +----------+--------+--------+--------+------------------+--------+ ICA Prox  41      14      1-39%   heterogenous               +----------+--------+--------+--------+------------------+--------+ ICA Distal121     36                                         +----------+--------+--------+--------+------------------+--------+ ECA       40      6                                          +----------+--------+--------+--------+------------------+--------+ +----------+--------+-------+--------+-------------------+           PSV cm/sEDV cmsDescribeArm Pressure (mmHG) +----------+--------+-------+--------+-------------------+ [id number]                                         +----------+--------+-------+--------+-------------------+ +---------+--------+--+--------+-+---------+ VertebralPSV cm/s34EDV cm/s7Antegrade +---------+--------+--+--------+-+---------+  Left Carotid Findings: +----------+--------+--------+--------+------------------+--------+           PSV cm/sEDV cm/sStenosisPlaque DescriptionComments +----------+--------+--------+--------+------------------+--------+ CCA Prox  32      9               heterogenous               +----------+--------+--------+--------+------------------+--------+ CCA Distal39      10              heterogenous               +----------+--------+--------+--------+------------------+--------+ ICA Prox  48      17      1-39%   heterogenous               +----------+--------+--------+--------+------------------+--------+ ICA Distal72      22                                         +----------+--------+--------+--------+------------------+--------+  ECA       52      6                                          +----------+--------+--------+--------+------------------+--------+ +----------+--------+--------+--------+-------------------+           PSV cm/sEDV cm/sDescribeArm Pressure (mmHG) +----------+--------+--------+--------+-------------------+ [id number]                                          +----------+--------+--------+--------+-------------------+ +---------+--------+--+--------+--+---------+ VertebralPSV cm/s46EDV cm/s12Antegrade +---------+--------+--+--------+--+---------+   Summary: Right Carotid: Velocities in the right ICA are consistent with a 1-39% stenosis. Left Carotid: Velocities in the left ICA are consistent with a 1-39% stenosis. Vertebrals: Bilateral vertebral arteries demonstrate antegrade flow. *See table(s) above for measurements and observations.  Electronically signed by Antony Contras MD on 06/26/2020 at 1:20:06 PM.    Final      Scheduled Meds: . aspirin EC  81 mg Oral Daily  . atorvastatin  20 mg Oral Daily  . [START ON 06/27/2020] clopidogrel  75 mg Oral Daily  . enoxaparin (LOVENOX) injection  30 mg Subcutaneous Q24H  . fenofibrate  160 mg Oral Daily  . gabapentin  300 mg Oral TID  . icosapent Ethyl  2 g Oral BID  . insulin aspart  0-15 Units Subcutaneous TID WC  . insulin glargine  25 Units Subcutaneous BID  . mometasone-formoterol  2 puff Inhalation BID  . pantoprazole  40 mg Oral Daily  . Pirfenidone  3 tablet Oral TID  . umeclidinium bromide  1 puff Inhalation Daily   Continuous Infusions:  Principal Problem:   CVA (cerebral vascular accident) ([place]) Active Problems:   Interstitial lung disease ([place])   COPD (chronic obstructive pulmonary disease) ([facility])   GERD (gastroesophageal reflux disease)   MDD (major depressive disorder), recurrent severe, without psychosis ([place])   Acute CVA (cerebrovascular accident) ([place])     Laurinda Carreno Derek Jack, [facility]  If  7PM-7AM, please contact night-coverage [email or web address] Password [email or web address] 06/26/2020, 4:20 PM    LOS: 0 days

## 2020-06-27 DIAGNOSIS — E7800 Pure hypercholesterolemia, unspecified: Secondary | ICD-10-CM | POA: Diagnosis not present

## 2020-06-27 LAB — GLUCOSE, CAPILLARY
Glucose-Capillary: 135 mg/dL — ABNORMAL HIGH (ref 70–99)
Glucose-Capillary: 187 mg/dL — ABNORMAL HIGH (ref 70–99)
Glucose-Capillary: 223 mg/dL — ABNORMAL HIGH (ref 70–99)
Glucose-Capillary: 155 mg/dL — ABNORMAL HIGH (ref 70–99)

## 2020-06-27 NOTE — Progress Notes (Signed)
Inpatient Rehab Admissions Coordinator:   I met with Pt. To discuss potential CIR admit. She stated interest. Her son was on the phone during meeting with pt. And he confirmed that he can provide 24/7 support and up to mod A at d/c. I will open a case with pt.'s insurance and pursue for potential admit this week, pending bed availability and insurance authorization.   Clemens Catholic, [place], [place] Admissions Coordinator  [phone] ([place]) [phone] (office)

## 2020-06-27 NOTE — Progress Notes (Signed)
PROGRESS NOTE    Erica [name]  PPI:[id number]  DOB: [birth year]  DOA: 06/25/2020 PCP: Nolene Ebbs, MD Outpatient Specialists:   Hospital course:  Erica [name] is a 65 y.o. right-handed female with a past medical history significant for hypertension, hypercholesterolemia, diabetes complicated by neuropathy, coronary artery disease, depression c/b prior suicide attempt, arthritis, 10-pack-year history of smoking (quit in 2020), COPD/asthma, crack/cocaine use (unclear if continuing) who has had 1 week of slurred speech and 1 day of left-sided weakness yesterday worse in her lower extremity resulting in a fall.  She went to see her PCP who referred her for ED to rule out stroke.  Work-up in the ED was notable for multifocal strokes in the right caudate and right thalamus, thought to be cardioembolic versus aortic arch atheroembolic.  Subjective:  Patient herself states she feels good.  Is hoping to go to rehab soon.  Has no acute concerns.   Objective: Vitals:   06/27/20 0400 06/27/20 0740 06/27/20 1133 06/27/20 1531  BP: (!) 162/92 (!) 170/99 (!) 159/88 (!) 153/82  Pulse: 62 68 78 73  Resp:  _0 Temp: 98.2 F (36.8 C) (!) 97.3 F (36.3 C) (!) 97.3 F (36.3 C) 97.9 F (36.6 C)  TempSrc: Oral Oral Oral Oral  SpO2: 100% 100% 98% 100%  Weight:      Height:        Intake/Output Summary (Last 24 hours) at 06/27/2020 1622 Last data filed at 06/27/2020 0740 Gross per 24 hour  Intake 117 ml  Output 1500 ml  Net -1383 ml   Filed Weights   06/25/20 2200  Weight: 83.8 kg     Exam:  General: Well groomed female looking older than stated age lying in bed in no acute distress, pleasantly conversant Eyes: sclera anicteric, conjuctiva mild injection bilaterally CVS: S1-S2, regular  Respiratory:  decreased air entry bilaterally secondary to decreased inspiratory effort, rales at bases  GI: NABS, soft, NT  LE: No edema.  Neuro: Per neuro evaluation Psych: patient is  logical and coherent, judgement and insight appear normal.  Assessment & Plan:   65 year old female with likely embolic stroke is awaiting placement in CIR  CVA Plan is for DAPT with aspirin and Plavix for 3 weeks then continuation with Plavix 75 mg alone She was loaded with Plavix on 06/26/2020.  She will continue Plavix until 07/17/2020 Continue atorvastatin recently on 20 mg, may need to increase to 40 per neurology recommendation PT recommends referral to CIR, CIR referral placed  HTN Permissive hypertension given acute stroke Losartan, metoprolol and amlodipine are being held Can consider normalizing blood pressure in 2 to 3-day  COPD/ILD No evidence for acute flare Continue Incruse Ellipta and as needed inhaled bronchodilators Continue Esbriet for treatment of ILD  GERD Continue Protonix daily  Depression with history of suicide attempt Continue BuSpar and Neurontin  Diabetes Hold Toujeo Solostar Continue glargine 25 units and SSI AC at bedtime    DVT prophylaxis: Lovenox Code Status: Full Family Communication: Patient's sister was at bedside throughout Disposition Plan:   Patient is from: Home  Anticipated Discharge Location: CIR  Barriers to Discharge: Completing stroke work-up  Is patient medically stable for Discharge: Yes   Consultants:  Neurology  Procedures:  None  Antimicrobials:  None   Data Reviewed:  Basic Metabolic Panel: Recent Labs  Lab 06/25/20 1525 06/25/20 1700 06/25/20 1724  NA 137 136 138  K 4.3 5.0 5.8*  CL 105 106 109  CO2  23 20*  --   GLUCOSE 138* 128* 126*  BUN 41* 40* 63*  CREATININE 1.79* 1.90* 1.90*  CALCIUM 9.2 9.2  --    Liver Function Tests: Recent Labs  Lab 06/25/20 1525 06/25/20 1700  AST 20 63*  ALT 12 20  ALKPHOS  --  65  BILITOT 1.1 0.4  PROT 8.3* 7.9  ALBUMIN  --  3.7   No results for input(s): LIPASE, AMYLASE in the last 168 hours. No results for input(s): AMMONIA in the last 168  hours. CBC: Recent Labs  Lab 06/25/20 1525 06/25/20 1700 06/25/20 1724  WBC 6.2 5.9  --   NEUTROABS  --  4.4  --   HGB 11.3* 11.1* 10.9*  HCT 32.0* 31.3* 32.0*  MCV 91.4 93.4  --   PLT 212 169  --    Cardiac Enzymes: No results for input(s): CKTOTAL, CKMB, CKMBINDEX, TROPONINI in the last 168 hours. BNP (last 3 results) No results for input(s): PROBNP in the last 8760 hours. CBG: Recent Labs  Lab 06/26/20 1757 06/26/20 2137 06/27/20 0634 06/27/20 1136 06/27/20 1620  GLUCAP 225* 139* 135* 187* 223*    Recent Results (from the past 240 hour(s))  SARS CORONAVIRUS 2 (TAT 6-24 HRS) Nasopharyngeal Nasopharyngeal Swab     Status: None   Collection Time: 06/25/20  6:21 PM   Specimen: Nasopharyngeal Swab  Result Value Ref Range Status   SARS Coronavirus 2 NEGATIVE NEGATIVE Final    Comment: (NOTE) SARS-CoV-2 target nucleic acids are NOT DETECTED.  The SARS-CoV-2 RNA is generally detectable in upper and lower respiratory specimens during the acute phase of infection. Negative results do not preclude SARS-CoV-2 infection, do not rule out co-infections with other pathogens, and should not be used as the sole basis for treatment or other patient management decisions. Negative results must be combined with clinical observations, patient history, and epidemiological information. The expected result is Negative.  Fact Sheet for Patients: SugarRoll.be  Fact Sheet for Healthcare Providers: https://www.woods-mathews.com/  This test is not yet approved or cleared by the [place] FDA and  has been authorized for detection and/or diagnosis of SARS-CoV-2 by FDA under an Emergency Use Authorization (EUA). This EUA will remain  in effect (meaning this test can be used) for the duration of the COVID-19 declaration under Se ction 564(b)(1) of the Act, 21 U.S.C. section 360bbb-3(b)(1), unless the authorization is terminated or revoked  sooner.  Performed at [facility], [place] [address] [place], [place] [address]       Studies: CT HEAD WO CONTRAST  Result Date: 06/25/2020 CLINICAL DATA:  Neuro deficit, acute, stroke suspected Left-sided weakness, fall. EXAM: CT HEAD WITHOUT CONTRAST TECHNIQUE: Contiguous axial images were obtained from the base of the skull through the vertex without intravenous contrast. COMPARISON:  Remote head CT 10/08/2010 FINDINGS: Brain: Brain volume is normal for age. There is a lacunar infarct in the right caudate as well as the right cerebellum, new from 2012 but age indeterminate. No evidence of large or territorial ischemia. No intracranial hemorrhage, mass effect, or midline shift. No hydrocephalus. The basilar cisterns are patent. No extra-axial or intracranial fluid collection. Vascular: Atherosclerosis of skullbase vasculature without hyperdense vessel or abnormal calcification. Skull: No fracture or focal lesion. Sinuses/Orbits: Mucosal thickening with bubbly debris and associated adjacent sclerosis of the left sphenoid sinus consistent chronic sinusitis. Remaining paranasal sinuses are clear. No mastoid effusion. Included orbits are unremarkable. Other: None. IMPRESSION: 1. Lacunar infarcts in the right caudate and right  cerebellum, new from 2012, but age indeterminate. 2. No hemorrhage or evidence of large or territorial ischemia. 3. Chronic left sphenoid sinusitis. Electronically Signed   By: Keith Rake M.D.   On: 06/25/2020 17:43   MR ANGIO HEAD WO CONTRAST  Result Date: 06/25/2020 CLINICAL DATA:  Neurological deficit.  Acute stroke suspected. EXAM: MRI HEAD WITHOUT CONTRAST MRA HEAD WITHOUT CONTRAST TECHNIQUE: Multiplanar, multiecho pulse sequences of the brain and surrounding structures were obtained without intravenous contrast. Angiographic images of the head were obtained using MRA technique without contrast. COMPARISON:  CT same day FINDINGS: MRI HEAD FINDINGS Brain: Diffusion  imaging shows a 1 cm acute infarction in the anterolateral thalamus on the right, possibly with some involvement of the posterior limb internal capsule. Acute infarction within the right caudate head. Scattered punctate foci of high signal on diffusion imaging within the cerebral hemispheric white matter on both sides. These white matter lesions likely represent T2 shine through within old infarctions of the hemispheric white matter, but do not show low signal on the ADC map. No large confluent infarction. Chronic small-vessel ischemic changes affect the pons. There are numerous old small vessel cerebellar infarctions. Old small vessel infarctions affect both thalami, the basal ganglia regions in the cerebral hemispheric deep and subcortical white matter. Old small cortical infarctions noted in the parieto-occipital regions and right frontal region. No hemorrhage, mass lesion, hydrocephalus or extra-axial collection. Vascular: Major vessels at the base of the brain show flow. Skull and upper cervical spine: Negative Sinuses/Orbits: Clear/normal Other: None MRA HEAD FINDINGS Both internal carotid arteries widely patent through the skull base and siphon regions. No stenosis. The anterior and middle cerebral vessels are normal without evidence of stenosis or large or medium vessel occlusion. Both vertebral arteries are widely patent to the basilar. Proximal basilar fenestration. No basilar stenosis. Posterior circulation branch vessels are patent. Mild atherosclerotic narrowing and irregularity of the PCA branches. IMPRESSION: 1. 1 cm acute infarction in the anterolateral thalamus on the right, possibly with some involvement of the posterior limb internal capsule. Acute infarction within the right caudate head. Scattered other foci of high signal on diffusion imaging within the hemispheric white matter, not showing restricted diffusion on the ADC map, and therefore probably representing T2 shine through from old white  matter infarctions. 2. Extensive old small vessel infarctions throughout the brain as outlined above. Small old cortical infarctions in the right frontal lobe and both parieto-occipital regions. 3. No large or medium vessel occlusion or correctable proximal stenosis. Mild atherosclerotic irregularity of the PCA branches. Electronically Signed   By: Nelson Chimes M.D.   On: 06/25/2020 20:56   MR Brain Wo Contrast (neuro protocol)  Result Date: 06/25/2020 CLINICAL DATA:  Neurological deficit.  Acute stroke suspected. EXAM: MRI HEAD WITHOUT CONTRAST MRA HEAD WITHOUT CONTRAST TECHNIQUE: Multiplanar, multiecho pulse sequences of the brain and surrounding structures were obtained without intravenous contrast. Angiographic images of the head were obtained using MRA technique without contrast. COMPARISON:  CT same day FINDINGS: MRI HEAD FINDINGS Brain: Diffusion imaging shows a 1 cm acute infarction in the anterolateral thalamus on the right, possibly with some involvement of the posterior limb internal capsule. Acute infarction within the right caudate head. Scattered punctate foci of high signal on diffusion imaging within the cerebral hemispheric white matter on both sides. These white matter lesions likely represent T2 shine through within old infarctions of the hemispheric white matter, but do not show low signal on the ADC map. No large confluent infarction. Chronic  small-vessel ischemic changes affect the pons. There are numerous old small vessel cerebellar infarctions. Old small vessel infarctions affect both thalami, the basal ganglia regions in the cerebral hemispheric deep and subcortical white matter. Old small cortical infarctions noted in the parieto-occipital regions and right frontal region. No hemorrhage, mass lesion, hydrocephalus or extra-axial collection. Vascular: Major vessels at the base of the brain show flow. Skull and upper cervical spine: Negative Sinuses/Orbits: Clear/normal Other: None MRA  HEAD FINDINGS Both internal carotid arteries widely patent through the skull base and siphon regions. No stenosis. The anterior and middle cerebral vessels are normal without evidence of stenosis or large or medium vessel occlusion. Both vertebral arteries are widely patent to the basilar. Proximal basilar fenestration. No basilar stenosis. Posterior circulation branch vessels are patent. Mild atherosclerotic narrowing and irregularity of the PCA branches. IMPRESSION: 1. 1 cm acute infarction in the anterolateral thalamus on the right, possibly with some involvement of the posterior limb internal capsule. Acute infarction within the right caudate head. Scattered other foci of high signal on diffusion imaging within the hemispheric white matter, not showing restricted diffusion on the ADC map, and therefore probably representing T2 shine through from old white matter infarctions. 2. Extensive old small vessel infarctions throughout the brain as outlined above. Small old cortical infarctions in the right frontal lobe and both parieto-occipital regions. 3. No large or medium vessel occlusion or correctable proximal stenosis. Mild atherosclerotic irregularity of the PCA branches. Electronically Signed   By: Nelson Chimes M.D.   On: 06/25/2020 20:56   MR CERVICAL SPINE WO CONTRAST  Result Date: 06/26/2020 CLINICAL DATA:  Myelopathy EXAM: MRI CERVICAL SPINE WITHOUT CONTRAST TECHNIQUE: Multiplanar, multisequence MR imaging of the cervical spine was performed. No intravenous contrast was administered. COMPARISON:  None. FINDINGS: Alignment: Reversal of normal cervical lordosis may be positional or due to muscle spasm. No static subluxation. Vertebrae: No fracture, evidence of discitis, or bone lesion. 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 The spinal cord is mildly atrophic. Posterior Fossa, vertebral arteries,  paraspinal tissues: Negative. Disc levels: C1-2: Unremarkable. C2-3: Normal disc space and facet joints. There is no spinal canal stenosis. No neural foraminal stenosis. C3-4: Normal disc space and facet joints. There is no spinal canal stenosis. No neural foraminal stenosis. C4-5: Small central disc protrusion indenting the ventral spinal cord. There is no spinal canal stenosis. No neural foraminal stenosis. C5-6: Intermediate sized central disc protrusion indenting the ventral spinal cord. Mild spinal canal stenosis. No neural foraminal stenosis. C6-7: Intermediate sized central disc protrusion indenting the ventral spinal cord. Moderate spinal canal stenosis. No neural foraminal stenosis. C7-T1: Small central disc extrusion with inferior migration, contacting the ventral spinal cord. Mild spinal canal stenosis. No neural foraminal stenosis. IMPRESSION: 1. 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 Mild generalized atrophy of the upper cervical spinal cord. 2. Moderate C6-7 and mild C5-6 and C7-T1 spinal canal stenosis secondary to central disc protrusions. Electronically Signed   By: Ulyses Jarred M.D.   On: 06/26/2020 03:34   ECHOCARDIOGRAM COMPLETE  Result Date: 06/26/2020    ECHOCARDIOGRAM REPORT   Patient Name:   Erica [name] Date of Exam: 06/26/2020 Medical Rec #:  [id number]    Height:       61.0 in Accession #:    [id number]   Weight:       184.7 lb Date of Birth:  10/31/1955  BSA:          1.826 m Patient Age:    26 years     BP:           143/85 mmHg Patient Gender: F            HR:           68 bpm. Exam Location:  Inpatient Procedure: 2D Echo, Cardiac Doppler and Color Doppler Indications:    Stroke 434.91 / I163.9  History:        Patient has prior history of Echocardiogram examinations, most                 recent 07/16/2019. Risk Factors:Hypertension, Diabetes and GERD.  Sonographer:    Jonelle Sidle Dance Referring Phys: [id number] [place]   1. Left ventricular ejection fraction, by estimation, is 55 to 60%. The left ventricle has normal function. The left ventricle has no regional wall motion abnormalities. Left ventricular diastolic parameters are indeterminate.  2. Right ventricular systolic function is normal. The right ventricular size is normal.  3. The mitral valve is normal in structure. No evidence of mitral valve regurgitation.  4. The aortic valve is tricuspid. Aortic valve regurgitation is trivial. No aortic stenosis is present.  5. The inferior vena cava is normal in size with greater than 50% respiratory variability, suggesting right atrial pressure of 3 mmHg. Comparison(s): No prior Echocardiogram. There is no 07/16/19 study available in the system. FINDINGS  Left Ventricle: LV Mass Index 95 g/m2 within reference range; RWT 0.85 concentric remodeling. Left ventricular ejection fraction, by estimation, is 55 to 60%. The left ventricle has normal function. The left ventricle has no regional wall motion abnormalities. The left ventricular internal cavity size was small. There is borderline concentric left ventricular hypertrophy. Left ventricular diastolic parameters are indeterminate. Right Ventricle: The right ventricular size is normal. No increase in right ventricular wall thickness. Right ventricular systolic function is normal. Left Atrium: Left atrial size was normal in size. Right Atrium: Right atrial size was normal in size. Pericardium: There is no evidence of pericardial effusion. Mitral Valve: The mitral valve is normal in structure. No evidence of mitral valve regurgitation. Tricuspid Valve: The tricuspid valve is normal in structure. Tricuspid valve regurgitation is trivial. Aortic Valve: The aortic valve is tricuspid. Aortic valve regurgitation is trivial. No aortic stenosis is present. Pulmonic Valve: The pulmonic valve was not well visualized. Pulmonic valve regurgitation is not visualized. Aorta: The aortic root and  ascending aorta are structurally normal, with no evidence of dilitation. Venous: The inferior vena cava is normal in size with greater than 50% respiratory variability, suggesting right atrial pressure of 3 mmHg. IAS/Shunts: The atrial septum is grossly normal.  LEFT VENTRICLE PLAX 2D LVIDd:         3.30 cm  Diastology LVIDs:         2.50 cm  LV e' medial:    3.26 cm/s LV PW:         1.60 cm  LV E/e' medial:  17.6 LV IVS:        1.40 cm  LV e' lateral:   5.00 cm/s LVOT diam:     1.90 cm  LV E/e' lateral: 11.5 LV SV:         49 LV SV Index:   27 LVOT Area:     2.84 cm  RIGHT VENTRICLE             IVC RV Basal diam:  1.90 cm     IVC diam: 1.50 cm RV S prime:     16.20 cm/s TAPSE (M-mode): 2.0 cm LEFT ATRIUM             Index       RIGHT ATRIUM           Index LA diam:        3.90 cm 2.14 cm/m  RA Area:     10.10 cm LA Vol (A2C):   35.3 ml 19.33 ml/m RA Volume:   16.40 ml  8.98 ml/m LA Vol (A4C):   40.0 ml 21.90 ml/m LA Biplane Vol: 38.8 ml 21.25 ml/m  AORTIC VALVE LVOT Vmax:   78.30 cm/s LVOT Vmean:  52.900 cm/s LVOT VTI:    0.172 m  AORTA Ao Root diam: 3.30 cm Ao Asc diam:  3.50 cm MITRAL VALVE MV Area (PHT): 3.12 cm    SHUNTS MV Decel Time: 243 msec    Systemic VTI:  0.17 m MV E velocity: 57.50 cm/s  Systemic Diam: 1.90 cm MV A velocity: 89.30 cm/s MV E/A ratio:  0.64 Rudean Haskell MD Electronically signed by Rudean Haskell MD Signature Date/Time: 06/26/2020/11:56:54 AM    Final    VAS US CAROTID (at [facility] and [facility] only)  Result Date: 06/26/2020 Carotid Arterial Duplex Study Indications:       CVA. Risk Factors:      Hypertension, Diabetes. Comparison Study:  no prior Performing Technologist: Abram Sander RVS  Examination Guidelines: A complete evaluation includes B-mode imaging, spectral Doppler, color Doppler, and power Doppler as needed of all accessible portions of each vessel. Bilateral testing is considered an integral part of a complete examination. Limited examinations for reoccurring  indications may be performed as noted.  Right Carotid Findings: +----------+--------+--------+--------+------------------+--------+           PSV cm/sEDV cm/sStenosisPlaque DescriptionComments +----------+--------+--------+--------+------------------+--------+ CCA Prox  62      12              heterogenous               +----------+--------+--------+--------+------------------+--------+ CCA Distal31      9               heterogenous               +----------+--------+--------+--------+------------------+--------+ ICA Prox  41      14      1-39%   heterogenous               +----------+--------+--------+--------+------------------+--------+ ICA Distal121     36                                         +----------+--------+--------+--------+------------------+--------+ ECA       40      6                                          +----------+--------+--------+--------+------------------+--------+ +----------+--------+-------+--------+-------------------+           PSV cm/sEDV cmsDescribeArm Pressure (mmHG) +----------+--------+-------+--------+-------------------+ [id number]                                         +----------+--------+-------+--------+-------------------+ +---------+--------+--+--------+-+---------+ VertebralPSV cm/s34EDV cm/s7Antegrade +---------+--------+--+--------+-+---------+  Left Carotid Findings: +----------+--------+--------+--------+------------------+--------+           PSV cm/sEDV cm/sStenosisPlaque DescriptionComments +----------+--------+--------+--------+------------------+--------+ CCA Prox  32      9               heterogenous               +----------+--------+--------+--------+------------------+--------+ CCA Distal39      10              heterogenous               +----------+--------+--------+--------+------------------+--------+ ICA Prox  48      17      1-39%   heterogenous                +----------+--------+--------+--------+------------------+--------+ ICA Distal72      22                                         +----------+--------+--------+--------+------------------+--------+ ECA       52      6                                          +----------+--------+--------+--------+------------------+--------+ +----------+--------+--------+--------+-------------------+           PSV cm/sEDV cm/sDescribeArm Pressure (mmHG) +----------+--------+--------+--------+-------------------+ [id number]                                          +----------+--------+--------+--------+-------------------+ +---------+--------+--+--------+--+---------+ VertebralPSV cm/s46EDV cm/s12Antegrade +---------+--------+--+--------+--+---------+   Summary: Right Carotid: Velocities in the right ICA are consistent with a 1-39% stenosis. Left Carotid: Velocities in the left ICA are consistent with a 1-39% stenosis. Vertebrals: Bilateral vertebral arteries demonstrate antegrade flow. *See table(s) above for measurements and observations.  Electronically signed by Antony Contras MD on 06/26/2020 at 1:20:06 PM.    Final      Scheduled Meds: . aspirin EC  81 mg Oral Daily  . atorvastatin  40 mg Oral Daily  . busPIRone  10 mg Oral Q0600  . clopidogrel  75 mg Oral Daily  . enoxaparin (LOVENOX) injection  30 mg Subcutaneous Q24H  . fenofibrate  160 mg Oral Daily  . gabapentin  300 mg Oral TID  . icosapent Ethyl  2 g Oral BID  . insulin aspart  0-15 Units Subcutaneous TID WC  . insulin glargine  25 Units Subcutaneous BID  . mometasone-formoterol  2 puff Inhalation BID  . pantoprazole  40 mg Oral Daily  . Pirfenidone  3 tablet Oral TID  . umeclidinium bromide  1 puff Inhalation Daily   Continuous Infusions:  Principal Problem:   CVA (cerebral vascular accident) ([place]) Active Problems:   Interstitial lung disease ([place])   COPD (chronic obstructive pulmonary disease) ([facility])   GERD  (gastroesophageal reflux disease)   MDD (major depressive disorder), recurrent severe, without psychosis ([place])   Acute CVA (cerebrovascular accident) ([place])     Cheyanna Strick Derek Jack, [facility]  If 7PM-7AM, please contact night-coverage [email or web address] Password [email or web address] 06/27/2020, 4:22 PM    LOS: 1 day

## 2020-06-27 NOTE — Evaluation (Addendum)
Speech Language Pathology Evaluation Patient Details Name: Erica [name] MRN: [id number] DOB: Jun 19, 1955 Today's Date: 06/27/2020 Time: 2831-5176 SLP Time Calculation (min) (ACUTE ONLY): 18.03 min  Problem List:  Patient Active Problem List   Diagnosis Date Noted  . Acute CVA (cerebrovascular accident) ([place]) 06/26/2020  . CVA (cerebral vascular accident) ([place]) 06/25/2020  . BMI 32.0-32.9,adult 06/03/2020  . Left upper lobe pneumonia 03/30/2020  . Chest pain 10/01/2019  . MDD (major depressive disorder), recurrent severe, without psychosis ([place]) 05/09/2019  . Suicide attempt ([place]) 05/09/2019  . Cocaine abuse ([place]) 05/09/2019  . Interstitial lung disease ([place]) 06/21/2016  . COPD (chronic obstructive pulmonary disease) ([place]) 06/21/2016  . Cough 06/21/2016  . GERD (gastroesophageal reflux disease) 06/21/2016  . Hypersomnia 06/21/2016  . Tobacco user 06/21/2016  . Rectal cancer ([place]) 01/30/2013   Past Medical History:  Past Medical History:  Diagnosis Date  . Arthritis    especially in shoulders  . Asthma   . Depression   . Diabetes mellitus   . GERD (gastroesophageal reflux disease)   . Headache(784.0)    "mild"  . Hypertension   . Mental disorder    depression  . Neuropathy    feet   . Pain    arthritis pain - takes tramadol as needed  . Rectal polyp    very little bleeding with bowel movements- no pain   Past Surgical History:  Past Surgical History:  Procedure Laterality Date  . ANAL RECTAL MANOMETRY N/A 07/13/2016   Procedure: ANO RECTAL MANOMETRY;  Surgeon: Leighton Ruff, MD;  Location: [facility];  Service: Endoscopy;  Laterality: N/A;  . CESAREAN SECTION    . EUS N/A 11/21/2012   Procedure: LOWER ENDOSCOPIC ULTRASOUND (EUS);  Surgeon: Arta Silence, MD;  Location: [facility];  Service: Endoscopy;  Laterality: N/A;  . FLEXIBLE SIGMOIDOSCOPY N/A 11/21/2012   Procedure: FLEXIBLE SIGMOIDOSCOPY;  Surgeon: Arta Silence, MD;  Location: [facility];  Service:  Endoscopy;  Laterality: N/A;  . FLEXIBLE SIGMOIDOSCOPY N/A 01/28/2013   Procedure: FLEXIBLE SIGMOIDOSCOPY;  Surgeon: Leighton Ruff, MD;  Location: [facility];  Service: Endoscopy;  Laterality: N/A;  . LAPAROSCOPIC LOW ANTERIOR RESECTION N/A 01/29/2013   Procedure: LAPAROSCOPIC LOW ANTERIOR RESECTION, Rigid Proctoscopy;  Surgeon: Leighton Ruff, MD;  Location: [facility];  Service: General;  Laterality: N/A;  . LAPAROSCOPIC SIGMOID COLECTOMY N/A 11/14/2012   Procedure: DIAGNOSTIC LAPAROSCOPY AND SIGMOIDMOIDOSCOPY ;  Surgeon: Rolm Bookbinder, MD;  Location: [facility];  Service: General;  Laterality: N/A;  . RECTAL ULTRASOUND N/A 07/13/2016   Procedure: RECTAL ULTRASOUND;  Surgeon: Leighton Ruff, MD;  Location: [facility];  Service: Endoscopy;  Laterality: N/A;  . TONSILLECTOMY    . TONSILLECTOMY AND ADENOIDECTOMY     HPI:  Pt is a 65 y/o female admitted secondary to fall and L sided weakness. 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 PMH includes COPD, MDD with suicide attempt, HTN, CAD, substance abuse and DM.   Assessment / Plan / Recommendation Clinical Impression  Speech Language Evaluation completed; Pt presents with mild dysarthria and mild/moderate cognitive impairment. Pt was administered the [facility] SLUMS Examination and achieved a score of 14/30 revealing needs in the areas of memory/short term recall, executive functioning & thought organization, and calculations; further note slow processing of information and benefit from a brief delay and repetition of questions. Mild dysarthria is characterized by decreased articulatory precision; Pt demonstrated slowed rate of speech to compensate resulting in intellitibility being 100% accurate - SLP reinforced this strategy of slow speech. Pt  is oriented and note naming is intact; Pt is aware of her impairments. Pt lives with her son but is responsible for her bills and would like to remain as independent as possible. Pt will benefit from more in depth  cognitive assessment and ST treatment as indicated at CIR. ST will continue follow acutely    SLP Assessment  SLP Recommendation/Assessment: ST will continue to follow  SLP Visit Diagnosis: Dysarthria and anarthria (R47.1);Cognitive communication deficit (R41.841)    Follow Up Recommendations  Inpatient Rehab     1/wk - 1 week     SLP Evaluation Cognition  Overall Cognitive Status: Impaired/Different from baseline Arousal/Alertness: Awake/alert Orientation Level: Oriented X4 Attention: Focused Focused Attention: Appears intact Memory: Impaired Memory Impairment: Decreased short term memory;Decreased recall of new information Decreased Short Term Memory: Verbal basic Awareness: Appears intact Problem Solving: Impaired Problem Solving Impairment: Verbal basic Executive Function: Reasoning;Organizing;Decision Making Reasoning: Impaired Reasoning Impairment: Verbal basic Organizing: Impaired Organizing Impairment: Verbal basic Decision Making: Impaired Decision Making Impairment: Verbal basic       Comprehension  Auditory Comprehension Overall Auditory Comprehension: Appears within functional limits for tasks assessed    Expression Verbal Expression Overall Verbal Expression: Appears within functional limits for tasks assessed Written Expression Dominant Hand: Right   Oral / Motor  Oral Motor/Sensory Function Overall Oral Motor/Sensory Function: Within functional limits Motor Speech Overall Motor Speech: Impaired Articulation: Impaired Level of Impairment: Sentence Intelligibility: Intelligible Motor Planning: Witnin functional limits     Halo Shevlin H. Roddie Mc, CCC-SLP Speech Language Pathologist  Wende Bushy 06/27/2020, 11:06 AM

## 2020-06-27 NOTE — Progress Notes (Signed)
PT Cancellation Note  Patient Details Name: Erica [name] MRN: [id number] DOB: 05-16-1956   Cancelled Treatment:    Reason Eval/Treat Not Completed: Other (comment). Pt with ST in room a treatment attempt. Will follow up today as time allows vs another date. Acute PT to continue.   Willow Ora, PTA, CLT Acute Rehab Services [phone] 06/27/20, 11:50 AM   Willow Ora 06/27/2020, 11:49 AM

## 2020-06-28 DIAGNOSIS — E7800 Pure hypercholesterolemia, unspecified: Secondary | ICD-10-CM | POA: Diagnosis not present

## 2020-06-28 LAB — CBC
HCT: 28.5 % — ABNORMAL LOW (ref 36.0–46.0)
Hemoglobin: 10.2 g/dL — ABNORMAL LOW (ref 12.0–15.0)
MCH: 32.5 pg (ref 26.0–34.0)
MCHC: 35.8 g/dL (ref 30.0–36.0)
MCV: 90.8 fL (ref 80.0–100.0)
Platelets: 171 10*3/uL (ref 150–400)
RBC: 3.14 MIL/uL — ABNORMAL LOW (ref 3.87–5.11)
RDW: 17.2 % — ABNORMAL HIGH (ref 11.5–15.5)
WBC: 3.8 10*3/uL — ABNORMAL LOW (ref 4.0–10.5)
nRBC: 0 % (ref 0.0–0.2)

## 2020-06-28 LAB — BASIC METABOLIC PANEL
Anion gap: 8 (ref 5–15)
BUN: 29 mg/dL — ABNORMAL HIGH (ref 8–23)
CO2: 21 mmol/L — ABNORMAL LOW (ref 22–32)
Calcium: 8.7 mg/dL — ABNORMAL LOW (ref 8.9–10.3)
Chloride: 112 mmol/L — ABNORMAL HIGH (ref 98–111)
Creatinine, Ser: 1.54 mg/dL — ABNORMAL HIGH (ref 0.44–1.00)
GFR, Estimated: 37 mL/min — ABNORMAL LOW (ref >60)
Glucose, Bld: 153 mg/dL — ABNORMAL HIGH (ref 70–99)
Potassium: 3.9 mmol/L (ref 3.5–5.1)
Sodium: 141 mmol/L (ref 135–145)

## 2020-06-28 LAB — GLUCOSE, CAPILLARY
Glucose-Capillary: 84 mg/dL (ref 70–99)
Glucose-Capillary: 162 mg/dL — ABNORMAL HIGH (ref 70–99)
Glucose-Capillary: 142 mg/dL — ABNORMAL HIGH (ref 70–99)
Glucose-Capillary: 193 mg/dL — ABNORMAL HIGH (ref 70–99)

## 2020-06-28 LAB — MAGNESIUM: Magnesium: 1.9 mg/dL (ref 1.7–2.4)

## 2020-06-28 MED ORDER — ALBUTEROL SULFATE (2.5 MG/3ML) 0.083% IN NEBU: 2.5000 mg | INHALATION_SOLUTION | RESPIRATORY_TRACT | Status: DC | PRN

## 2020-06-28 MED ORDER — ENOXAPARIN SODIUM 40 MG/0.4ML ~~LOC~~ SOLN
40.0000 mg | SUBCUTANEOUS | Status: DC
  Administered 2020-06-28 – 2020-06-29 (×2): 40 mg via SUBCUTANEOUS
  Filled 2020-06-28 (×2): qty 0.4

## 2020-06-28 NOTE — PMR Pre-admission (Addendum)
PMR Admission Coordinator Pre-Admission Assessment  Patient: Erica [name] is an 65 y.o., female MRN: [id number] DOB: 1956-05-02 Height: _0  (154.9 cm) Weight: 83.8 kg              Insurance Information HMO: yes    PPO:      PCP:      IPA:      80/20:      OTHER:  PRIMARY: [employer]      Policy#: [id number]      Subscriber: pt CM Name: Erica [name]      Phone#: [phone] ext [phone]     Fax#: [phone] Received approval for admission from [facility]  On 1/17 for admission on 1/17 with 5 day window to admit. Requests team conference notes within 7 days of admission.  Pre-Cert#: [id number]      Employer: n/a Benefits:  Phone #:     Name:  Eff. Date:  06/13/2016 - Present    Deductible: :does not have for in-network providers plan is under dual eligible special needs plan with Medicaid Cost Share Protection OOP Max: $3,450 ($0 met) CIR: $0 co-pay, 100% coverage, 0% co-insurance SNF: $0 co-pay 1-100 days, 100% coverage, 0% co-insurance Outpatient: 100% coverage, 0% co-insurance       Home Health:  100% coverage, 0% co-insurance; limited by medical necessity DME: 100% coverage, 0% co-insurance Providers: in network  SECONDARY: none   Policy#:       Phone#:    Development worker, community:       Phone#:   [employer], [employer]" for patients in Inpatient Rehabilitation Facilities with attached "Privacy Act [place] Records" was provided and verbally reviewed with: Patient  Emergency Contact Information Contact Information    Name Relation Home Work Mobile   Erica [name] Sister [phone]     Erica [name] [phone]       Current Medical History  Patient Admitting Diagnosis: CVA  History of Present Illness: Erica [name] is a 65 year old right-handed female with history of hypertension, rectal cancer, hyperlipidemia diabetes mellitus, COPD as well as cocaine use.  Per chart review lives with her children.  1 level home 4 steps to entry.  Independent with  assistive device.  She has a sister who checks on her routinely.  Presented 06/25/2020 with left-sided weakness slurred speech as well as a fall.  Patient had reportedly recent episode of questionable syncope.  CT/MRI showed a 1 cm acute infarction in the anterior lateral thalamus on the right with some involvement of the posterior limb internal capsule.  Acute infarction within the right caudate head.  Scattered other foci of high signal diffusion imaging within the hemispheric white matter.  Extensive old small vessel infarctions throughout the brain.  Patient did not receive tPA.  MRA was unremarkable.  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  Moderate C6-7 and mild C5-6 and C7-T1 spinal canal stenosis secondary to central disc protrusions.  Admission chemistries unremarkable except glucose 128 creatinine 1.90 SARS coronavirus negative urine drug screen negative.  Carotid Dopplers with no ICA stenosis.  Echocardiogram with ejection fraction of 55 to 60% no wall motion abnormalities.  Maintained on Plavix and aspirin for CVA prophylaxis x3 weeks then Plavix alone.  Lovenox for DVT prophylaxis.  Tolerating a regular consistency diet.  Due to patient's left-sided weakness and slurred speech physical medicine rehab consult was requested and patient was admitted for a comprehensive rehab program.  Complete NIHSS TOTAL: 1  Glasgow Coma Scale Score: 15  Past Medical History  Past Medical History:  Diagnosis Date  . Arthritis    especially in shoulders  . Asthma   . Depression   . Diabetes mellitus   . GERD (gastroesophageal reflux disease)   . Headache(784.0)    "mild"  . Hypertension   . Mental disorder    depression  . Neuropathy    feet   . Pain    arthritis pain - takes tramadol as needed  . Rectal polyp    very little bleeding with bowel movements- no pain    Family History  family history includes Cancer in her  maternal aunt, maternal uncle, maternal uncle, maternal uncle, paternal aunt, and paternal uncle; Cancer (age of onset: 19) in an other family member; Diabetes in her father; Heart disease in her mother; Hyperlipidemia in her mother; Hypertension in her brother, brother, father, mother, sister, and sister; Stroke in her father.  Prior Rehab/Hospitalizations:  Has the patient had prior rehab or hospitalizations prior to admission? No  Has the patient had major surgery during 100 days prior to admission? No  Current Medications   Current Facility-Administered Medications:  .  acetaminophen (TYLENOL) tablet 650 mg, 650 mg, Oral, Q4H PRN, 650 mg at 06/29/20 1651 **OR** acetaminophen (TYLENOL) 160 MG/5ML solution 650 mg, 650 mg, Per Tube, Q4H PRN **OR** acetaminophen (TYLENOL) suppository 650 mg, 650 mg, Rectal, Q4H PRN, Marcelyn Bruins, MD .  albuterol (PROVENTIL) (2.5 MG/3ML) 0.083% nebulizer solution 2.5 mg, 2.5 mg, Nebulization, Q4H PRN, Bonnell Public Tublu, MD .  amLODipine (NORVASC) tablet 10 mg, 10 mg, Oral, Daily, Jennye Boroughs, MD, 10 mg at 06/30/20 1026 .  aspirin EC tablet 81 mg, 81 mg, Oral, Daily, Marcelyn Bruins, MD, 81 mg at 06/30/20 1026 .  atorvastatin (LIPITOR) tablet 40 mg, 40 mg, Oral, Daily, Rosalin Hawking, MD, 40 mg at 06/30/20 1025 .  busPIRone (BUSPAR) tablet 10 mg, 10 mg, Oral, Q0600, Vashti Hey, MD, 10 mg at 06/30/20 0641 .  clopidogrel (PLAVIX) tablet 75 mg, 75 mg, Oral, Daily, Dagar, Anjali, MD, 75 mg at 06/30/20 1025 .  enoxaparin (LOVENOX) injection 40 mg, 40 mg, Subcutaneous, Q24H, Hammons, Kimberly B, RPH, 40 mg at 06/29/20 2316 .  escitalopram (LEXAPRO) tablet 10 mg, 10 mg, Oral, Daily, Jennye Boroughs, MD, 10 mg at 06/30/20 1026 .  fenofibrate tablet 160 mg, 160 mg, Oral, Daily, Marcelyn Bruins, MD, 160 mg at 06/30/20 1026 .  gabapentin (NEURONTIN) capsule 300 mg, 300 mg, Oral, TID, Marcelyn Bruins, MD, 300 mg at 06/30/20 1026 .   hydrOXYzine (ATARAX/VISTARIL) tablet 25 mg, 25 mg, Oral, BID, Jennye Boroughs, MD, 25 mg at 06/30/20 1026 .  icosapent Ethyl (VASCEPA) 1 g capsule 2 g, 2 g, Oral, BID, Marcelyn Bruins, MD, 2 g at 06/30/20 1025 .  insulin aspart (novoLOG) injection 0-15 Units, 0-15 Units, Subcutaneous, TID WC, Marcelyn Bruins, MD, 5 Units at 06/29/20 1650 .  insulin glargine (LANTUS) injection 25 Units, 25 Units, Subcutaneous, BID, Marcelyn Bruins, MD, 25 Units at 06/30/20 1026 .  metoprolol succinate (TOPROL-XL) 24 hr tablet 50 mg, 50 mg, Oral, Daily, Jennye Boroughs, MD, 50 mg at 06/30/20 1026 .  mometasone-formoterol (DULERA) 200-5 MCG/ACT inhaler 2 puff, 2 puff, Inhalation, BID, Marcelyn Bruins, MD, 2 puff at 06/30/20 [phone] .  pantoprazole (PROTONIX) EC tablet 40 mg, 40 mg, Oral, Daily, Marcelyn Bruins, MD, 40 mg at 06/30/20 1026 .  Pirfenidone TABS 801 mg,  3 tablet, Oral, TID, Marcelyn Bruins, MD, 801 mg at 06/30/20 1034 .  senna-docusate (Senokot-S) tablet 1 tablet, 1 tablet, Oral, QHS PRN, Marcelyn Bruins, MD .  umeclidinium bromide (INCRUSE ELLIPTA) 62.5 MCG/INH 1 puff, 1 puff, Inhalation, Daily, Marcelyn Bruins, MD  Patients Current Diet:  Diet Order            Diet regular Room service appropriate? Yes; Fluid consistency: Thin  Diet effective now                 Precautions / Restrictions Precautions Precautions: Fall Restrictions Weight Bearing Restrictions: No   Has the patient had 2 or more falls or a fall with injury in the past year?No  Prior Activity Level Community (5-7x/wk): Pt was avtive in the community PTA  Prior Functional Level Prior Function Level of Independence: Independent with assistive device(s) Comments: did her own medication management. uses cane for mobility  Self Care: Did the patient need help bathing, dressing, using the toilet or eating?  Independent  Indoor Mobility: Did the patient need assistance with walking from room to room  (with or without device)? Independent  Stairs: Did the patient need assistance with internal or external stairs (with or without device)? Independent  Functional Cognition: Did the patient need help planning regular tasks such as shopping or remembering to take medications? Independent  Home Assistive Devices / Equipment Home Assistive Devices/Equipment: None Home Equipment: Shower seat,Cane - single point  Prior Device Use: Indicate devices/aids used by the patient prior to current illness, exacerbation or injury? Walker  Current Functional Level Cognition  Arousal/Alertness: Awake/alert Overall Cognitive Status: Impaired/Different from baseline Current Attention Level: Selective Orientation Level: Oriented X4 Following Commands: Follows one step commands with increased time Safety/Judgement: Decreased awareness of deficits,Decreased awareness of safety General Comments: Impaired awareness of safety and deficits. "Leave that RW right by my chair so i can reach it." "I can do it by myself," when told how to safely get back to bed by calling nurse. Joking appropriately with therapists. Highly motivated. Attention: Focused Focused Attention: Appears intact Memory: Impaired Memory Impairment: Decreased short term memory,Decreased recall of new information Decreased Short Term Memory: Verbal basic Awareness: Appears intact Problem Solving: Impaired Problem Solving Impairment: Verbal basic Executive Function: Reasoning,Organizing,Decision Making Reasoning: Impaired Reasoning Impairment: Verbal basic Organizing: Impaired Organizing Impairment: Verbal basic Decision Making: Impaired Decision Making Impairment: Verbal basic    Extremity Assessment (includes Sensation/Coordination)  Upper Extremity Assessment: LUE deficits/detail LUE Deficits / Details: Apparent RTC insufficiency at baseline; ROM limited at shoujlder; apparent sensory motor deficits; able to touch finger to nose with  increased time. poor inhand manipulation skills although able to complete full grip and oppose thumb to fingers LUE Sensation: decreased light touch,decreased proprioception LUE Coordination: decreased fine motor,decreased gross motor  Lower Extremity Assessment: Defer to PT evaluation LLE Deficits / Details: L knee buckling    ADLs  Overall ADL's : Needs assistance/impaired Eating/Feeding: Set up Grooming: Minimal assistance,Sitting Upper Body Bathing: Minimal assistance,Sitting Lower Body Bathing: Maximal assistance,Sit to/from stand Upper Body Dressing : Moderate assistance,Sitting Lower Body Dressing: Maximal assistance,Sit to/from stand Toilet Transfer: Minimal assistance,RW Toileting- Clothing Manipulation and Hygiene: Minimal assistance,Sit to/from stand Functional mobility during ADLs: Minimal assistance,Rolling walker General ADL Comments: Min A for short distance functional mobility with RW. Mod cues for walker management.    Mobility  Overal bed mobility: Needs Assistance Bed Mobility: Supine to Sit,Sit to Supine Supine to sit: Mod assist,+2 for physical assistance,+2 for safety/equipment Sit  to supine: Mod assist,+2 for physical assistance,+2 for safety/equipment General bed mobility comments: Patient seated in recliner upon entry.    Transfers  Overall transfer level: Needs assistance Equipment used: Rolling walker (2 wheeled) Transfers: Sit to/from Stand Sit to Stand: Min assist,Min guard General transfer comment: Min A from low surfaces and Min guard from elevated surfaces.    Ambulation / Gait / Stairs / Wheelchair Mobility  Ambulation/Gait Ambulation/Gait assistance: Herbalist (Feet): 120 Feet Assistive device: Rolling walker (2 wheeled) Gait Pattern/deviations: Step-to pattern,Step-through pattern,Decreased stance time - left,Decreased step length - right General Gait Details: Slow, unsteady gait with left knee instability; cues for step through  gait pattern and RW proximity/management. 2 seated rest breaks due to LLE fatigue. Requires standing rest breaks due to UE soreness due to heavy use of them during gait. Gait velocity: decreased Gait velocity interpretation: <1.8 ft/sec, indicate of risk for recurrent falls    Posture / Balance Dynamic Sitting Balance Sitting balance - Comments: Patient able to maintain sitting balance while threading BLE through LB clothing. Balance Overall balance assessment: Needs assistance Sitting-balance support: Feet supported,No upper extremity supported Sitting balance-Leahy Scale: Good Sitting balance - Comments: Patient able to maintain sitting balance while threading BLE through LB clothing. Standing balance support: During functional activity Standing balance-Leahy Scale: Fair Standing balance comment: Able to maintain standing balance with unilateral UE support on RW during hygiene/clothing management.    Special needs/care consideration  Diabetic Management: DM2 with sQ Lantus and Novolog,  and Designated visitor Raylen Ken (son)     Previous Home Environment (from acute therapy documentation) Living Arrangements: Children  Lives With: Son Available Help at Discharge: Family,Available 24 hours/day Type of Home: Apartment Home Layout: One level Home Access: Stairs to enter Entrance Stairs-Rails: Left Entrance Stairs-Number of Steps: 14 Bathroom Shower/Tub: Tub/shower unit,Curtain Biochemist, clinical: Handicapped height Bathroom Accessibility: Yes How Accessible: Accessible via walker [place]: No  Discharge Living Setting Plans for Discharge Living Setting: Patient's home Type of Home at Discharge: Apartment Discharge Home Layout: One level Discharge Home Access: Stairs to enter Entrance Stairs-Rails: Left Entrance Stairs-Number of Steps: 14 Discharge Bathroom Shower/Tub: [place] unit Discharge Bathroom Toilet: Handicapped height Discharge Bathroom Accessibility:  Yes How Accessible: Accessible via walker Does the patient have any problems obtaining your medications?: No  Social/Family/Support Systems Patient Roles: Other (Comment) Contact Information: [phone] Anticipated Caregiver: Brandice Busser (son) Spoke with him over the phone. He confirmed he can provide up to mod A support and supervision 24/7 at d/c Anticipated Caregiver's Contact Information: [phone] Ability/Limitations of Caregiver: Can provide mod A Caregiver Availability: 24/7 Discharge Plan Discussed with Primary Caregiver: Yes Is Caregiver In Agreement with Plan?: Yes Does Caregiver/Family have Issues with Lodging/Transportation while Pt is in Rehab?: No   Goals Patient/Family Goal for Rehab: PT/OT/SLP Min A Expected length of stay: 7-10  days Pt/Family Agrees to Admission and willing to participate: Yes Program Orientation Provided & Reviewed with Pt/Caregiver Including Roles  & Responsibilities: Yes   Decrease burden of Care through IP rehab admission: Specialzed equipment needs, Decrease number of caregivers, Bowel and bladder program and Patient/family education   Possible need for SNF placement upon discharge:not anticipated  Patient Condition: This patient's condition remains as documented in the consult dated 06/30/2020, in which the Rehabilitation Physician determined and documented that the patient's condition is appropriate for intensive rehabilitative care in an inpatient rehabilitation facility. Will admit to inpatient rehab today.  Preadmission Screen Completed By:  Genella Mech, CCC-SLP,  06/30/2020 10:50 AM ______________________________________________________________________   Discussed status with Dr. Naaman Plummer on 06/30/20 at  74 and received approval for admission today.  Admission Coordinator:  Genella Mech, time 1050/Date 06/30/20

## 2020-06-28 NOTE — Progress Notes (Addendum)
PROGRESS NOTE    Erica [name]  BRK:[id number]  DOB: January 04, 1956  DOA: 06/25/2020 PCP: Nolene Ebbs, MD Outpatient Specialists:   Hospital course:  Erica [name] is a 65 y.o. right-handed female with a past medical history significant for hypertension, hypercholesterolemia, diabetes complicated by neuropathy, coronary artery disease, depression c/b prior suicide attempt, arthritis, 10-pack-year history of smoking (quit in 2020), COPD/asthma, crack/cocaine use (unclear if continuing) who has had 1 week of slurred speech and 1 day of left-sided weakness yesterday worse in her lower extremity resulting in a fall.  She went to see her PCP who referred her for ED to rule out stroke.  Work-up in the ED was notable for multifocal strokes in the right caudate and right thalamus, thought to be cardioembolic versus aortic arch atheroembolic.  Subjective: Patient notes "my wet cough is coming back. Am I getting my Esbrient? That will kick it out." Denies SOB or chills or malaise. Is looking forward to Rehab. Notes her lip is less numb and feeling is coming back in hand.    Objective: Vitals:   06/27/20 1531 06/28/20 0003 06/28/20 0426 06/28/20 0741  BP: (!) 153/82 (!) 149/79 (!) 162/79   Pulse: 73 77 64   Resp: _0 Temp: 97.9 F (36.6 C) 98.3 F (36.8 C) 98.6 F (37 C)   TempSrc: Oral Oral Oral   SpO2: 100% 98% 100% 100%  Weight:      Height:        Intake/Output Summary (Last 24 hours) at 06/28/2020 1122 Last data filed at 06/28/2020 1595 Gross per 24 hour  Intake 360 ml  Output 500 ml  Net -140 ml   Filed Weights   06/25/20 2200  Weight: 83.8 kg     Exam:  General: Well groomed female looking older than stated age lying in bed in no acute distress, pleasantly conversant Eyes: sclera anicteric, conjuctiva mild injection bilaterally CVS: S1-S2, regular  Respiratory:  decreased air entry bilaterally, no wheezes GI: NABS, soft, NT  LE: No edema.  Neuro: Per neuro  evaluation Psych: patient is logical and coherent, judgement and insight appear normal.  Assessment & Plan:   65 year old female with likely embolic stroke is awaiting placement in CIR  CVA Plan is for DAPT with aspirin and Plavix for 3 weeks then continuation with Plavix 75 mg alone She was loaded with Plavix on 06/26/2020.  She will continue Plavix until 07/17/2020 Continue atorvastatin recently on 20 mg, may need to increase to 40 per neurology recommendation PT recommends referral to CIR, CIR referral placed  COPD/ILD Reviewed MAR, Esbrient for ILD was started last night, not given earlier because of inability to get med--I informed patient she is presently on it.  No evidence for acute flare Continue Incruse Ellipta and as needed inhaled bronchodilators  HTN Permissive hypertension given acute stroke Losartan, metoprolol and amlodipine are being held Can consider restarting blood pressure meds in 2 to 3-day  AKI Creatinine improving while in house.  GERD Continue Protonix daily  Depression with history of suicide attempt Continue BuSpar and Neurontin  Diabetes Hold Toujeo Solostar Continue glargine 25 units and SSI AC at bedtime    DVT prophylaxis: Lovenox Code Status: Full Family Communication: Spoke at length with patient, she will talk to her sister Disposition Plan:   Patient is from: Home  Anticipated Discharge Location: CIR  Barriers to Discharge: Completing stroke work-up  Is patient medically stable for Discharge: Yes   Consultants:  Neurology  Procedures:  None  Antimicrobials:  None   Data Reviewed:  Basic Metabolic Panel: Recent Labs  Lab 06/25/20 1525 06/25/20 1700 06/25/20 1724 06/28/20 0316  NA 137 136 138 141  K 4.3 5.0 5.8* 3.9  CL 105 106 109 112*  CO2 23 20*  --  21*  GLUCOSE 138* 128* 126* 153*  BUN 41* 40* 63* 29*  CREATININE 1.79* 1.90* 1.90* 1.54*  CALCIUM 9.2 9.2  --  8.7*  MG  --   --   --  1.9   Liver Function  Tests: Recent Labs  Lab 06/25/20 1525 06/25/20 1700  AST 20 63*  ALT 12 20  ALKPHOS  --  65  BILITOT 1.1 0.4  PROT 8.3* 7.9  ALBUMIN  --  3.7   No results for input(s): LIPASE, AMYLASE in the last 168 hours. No results for input(s): AMMONIA in the last 168 hours. CBC: Recent Labs  Lab 06/25/20 1525 06/25/20 1700 06/25/20 1724 06/28/20 0316  WBC 6.2 5.9  --  3.8*  NEUTROABS  --  4.4  --   --   HGB 11.3* 11.1* 10.9* 10.2*  HCT 32.0* 31.3* 32.0* 28.5*  MCV 91.4 93.4  --  90.8  PLT 212 169  --  171   Cardiac Enzymes: No results for input(s): CKTOTAL, CKMB, CKMBINDEX, TROPONINI in the last 168 hours. BNP (last 3 results) No results for input(s): PROBNP in the last 8760 hours. CBG: Recent Labs  Lab 06/27/20 0634 06/27/20 1136 06/27/20 1620 06/27/20 2107 06/28/20 0616  GLUCAP 135* 187* 223* 155* 84    Recent Results (from the past 240 hour(s))  SARS CORONAVIRUS 2 (TAT 6-24 HRS) Nasopharyngeal Nasopharyngeal Swab     Status: None   Collection Time: 06/25/20  6:21 PM   Specimen: Nasopharyngeal Swab  Result Value Ref Range Status   SARS Coronavirus 2 NEGATIVE NEGATIVE Final    Comment: (NOTE) SARS-CoV-2 target nucleic acids are NOT DETECTED.  The SARS-CoV-2 RNA is generally detectable in upper and lower respiratory specimens during the acute phase of infection. Negative results do not preclude SARS-CoV-2 infection, do not rule out co-infections with other pathogens, and should not be used as the sole basis for treatment or other patient management decisions. Negative results must be combined with clinical observations, patient history, and epidemiological information. The expected result is Negative.  Fact Sheet for Patients: SugarRoll.be  Fact Sheet for Healthcare Providers: https://www.woods-mathews.com/  This test is not yet approved or cleared by the [place] FDA and  has been authorized for detection and/or  diagnosis of SARS-CoV-2 by FDA under an Emergency Use Authorization (EUA). This EUA will remain  in effect (meaning this test can be used) for the duration of the COVID-19 declaration under Se ction 564(b)(1) of the Act, 21 U.S.C. section 360bbb-3(b)(1), unless the authorization is terminated or revoked sooner.  Performed at [facility], [place] [address] [place], [place] [address]       Studies: No results found.   Scheduled Meds: . aspirin EC  81 mg Oral Daily  . atorvastatin  40 mg Oral Daily  . busPIRone  10 mg Oral Q0600  . clopidogrel  75 mg Oral Daily  . enoxaparin (LOVENOX) injection  30 mg Subcutaneous Q24H  . fenofibrate  160 mg Oral Daily  . gabapentin  300 mg Oral TID  . icosapent Ethyl  2 g Oral BID  . insulin aspart  0-15 Units Subcutaneous TID WC  . insulin glargine  25  Units Subcutaneous BID  . mometasone-formoterol  2 puff Inhalation BID  . pantoprazole  40 mg Oral Daily  . Pirfenidone  3 tablet Oral TID  . umeclidinium bromide  1 puff Inhalation Daily   Continuous Infusions:  Principal Problem:   CVA (cerebral vascular accident) ([place]) Active Problems:   Interstitial lung disease ([place])   COPD (chronic obstructive pulmonary disease) ([facility])   GERD (gastroesophageal reflux disease)   MDD (major depressive disorder), recurrent severe, without psychosis ([place])   Acute CVA (cerebrovascular accident) ([place])     Adreanne Yono Derek Jack, [facility]  If 7PM-7AM, please contact night-coverage [email or web address] Password [email or web address] 06/28/2020, 11:22 AM    LOS: 2 days

## 2020-06-28 NOTE — Consult Note (Signed)
Physical Medicine and Rehabilitation Consult Reason for Consult: Left-sided weakness with slurred speech Referring Physician: Internal medicine   HPI: Erica [name] is a 65 y.o. right-handed female with history of hypertension hyperlipidemia diabetes mellitus COPD cocaine use as well as rectal cancer.  Per chart review patient lives with her children.  1 level home 4 steps to entry.  Independent with assistive device.  Her sister checks on her routinely.  Presented 06/25/2020 with left-sided weakness/slurred speech as well as fall.  Patient had reported recent episodes of questionable syncope.  CT/MRI showed a 1 cm acute infarction in the anterior lateral thalamus on the right with some involvement of the posterior limb internal capsule.  Acute infarction within the right caudate head.  Scattered other foci of high signal diffusion imaging within the hemispheric white matter.  Extensive old small vessel infarctions throughout the brain.  Patient did not receive tPA.  MRA was unremarkable.  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  Moderate C6-7 and mild C5-6 and C7-T1 spinal canal stenosis secondary to central disc protrusions.  Admission chemistries unremarkable except glucose 128, creatinine 1.90, SARS coronavirus negative, urine drug screen negative.  Carotid Dopplers with no ICA stenosis.  Echocardiogram with ejection fraction of 55 to 60% no wall motion abnormalities.  Maintain on aspirin and Plavix for CVA prophylaxis x3 weeks then Plavix alone.  Maintain on a regular diet.  Therapy evaluations completed recommendations of physical medicine rehab consult secondary left-sided weakness and slurred speech as well as decreased functional mobility.  Occupational Therapy in room.  Ambulated from chair to toilet with min assist x1. Review of Systems  Constitutional: Negative for chills and fever.  HENT: Negative for hearing loss.    Eyes: Negative for blurred vision and double vision.  Respiratory: Negative for cough and shortness of breath.   Cardiovascular: Negative for chest pain, palpitations and leg swelling.  Gastrointestinal: Positive for constipation. Negative for heartburn, nausea and vomiting.       GERD  Genitourinary: Negative for dysuria, flank pain and hematuria.  Musculoskeletal: Positive for joint pain and myalgias.  Skin: Negative for rash.  Neurological: Positive for speech change, weakness and headaches.  Psychiatric/Behavioral: Positive for depression. The patient has insomnia.   All other systems reviewed and are negative.  Past Medical History:  Diagnosis Date  . Arthritis    especially in shoulders  . Asthma   . Depression   . Diabetes mellitus   . GERD (gastroesophageal reflux disease)   . Headache(784.0)    "mild"  . Hypertension   . Mental disorder    depression  . Neuropathy    feet   . Pain    arthritis pain - takes tramadol as needed  . Rectal polyp    very little bleeding with bowel movements- no pain   Past Surgical History:  Procedure Laterality Date  . ANAL RECTAL MANOMETRY N/A 07/13/2016   Procedure: ANO RECTAL MANOMETRY;  Surgeon: Leighton Ruff, MD;  Location: [facility];  Service: Endoscopy;  Laterality: N/A;  . CESAREAN SECTION    . EUS N/A 11/21/2012   Procedure: LOWER ENDOSCOPIC ULTRASOUND (EUS);  Surgeon: Arta Silence, MD;  Location: [facility];  Service: Endoscopy;  Laterality: N/A;  . FLEXIBLE SIGMOIDOSCOPY N/A 11/21/2012   Procedure: FLEXIBLE SIGMOIDOSCOPY;  Surgeon: Arta Silence, MD;  Location: [facility];  Service: Endoscopy;  Laterality: N/A;  . FLEXIBLE SIGMOIDOSCOPY N/A 01/28/2013   Procedure: FLEXIBLE  SIGMOIDOSCOPY;  Surgeon: Leighton Ruff, MD;  Location: [facility];  Service: Endoscopy;  Laterality: N/A;  . LAPAROSCOPIC LOW ANTERIOR RESECTION N/A 01/29/2013   Procedure: LAPAROSCOPIC LOW ANTERIOR RESECTION, Rigid Proctoscopy;  Surgeon: Leighton Ruff, MD;  Location: [facility];  Service: General;  Laterality: N/A;  . LAPAROSCOPIC SIGMOID COLECTOMY N/A 11/14/2012   Procedure: DIAGNOSTIC LAPAROSCOPY AND SIGMOIDMOIDOSCOPY ;  Surgeon: Rolm Bookbinder, MD;  Location: [facility];  Service: General;  Laterality: N/A;  . RECTAL ULTRASOUND N/A 07/13/2016   Procedure: RECTAL ULTRASOUND;  Surgeon: Leighton Ruff, MD;  Location: [facility];  Service: Endoscopy;  Laterality: N/A;  . TONSILLECTOMY    . TONSILLECTOMY AND ADENOIDECTOMY     Family History  Problem Relation Age of Onset  . Hypertension Father   . Stroke Father   . Diabetes Father   . Heart disease Mother   . Hypertension Mother   . Hyperlipidemia Mother   . Hypertension Sister   . Hypertension Brother   . Hypertension Sister   . Hypertension Brother   . Cancer Other 47       colon cancer   . Cancer Maternal Aunt        cancer, unknown type   . Cancer Maternal Uncle        bone cancer   . Cancer Paternal Aunt        lung cancer  . Cancer Paternal Uncle        lung cancer  . Cancer Maternal Uncle        colon cancer and prostate cancer   . Cancer Maternal Uncle        prostate cancer    Social History:  reports that she quit smoking about 2 years ago. Her smoking use included cigarettes. She has a 10.00 pack-year smoking history. She has never used smokeless tobacco. She reports previous alcohol use. She reports current drug use. Drug: "Crack" cocaine. Allergies:  Allergies  Allergen Reactions  . Penicillins Hives, Itching and Swelling    Tongue swells up Has patient had a PCN reaction causing immediate rash, facial/tongue/throat swelling, SOB or lightheadedness with hypotension: Yes Has patient had a PCN reaction causing severe rash involving mucus membranes or skin necrosis: Yes Has patient had a PCN reaction that required hospitalization Yes Has patient had a PCN reaction occurring within the last 10 years: No If all of the above answers are "NO", then may proceed with  Cephalosporin use.    Medications Prior to Admission  Medication Sig Dispense Refill  . albuterol (PROVENTIL) (2.5 MG/3ML) 0.083% nebulizer solution Take 3 mLs (2.5 mg total) by nebulization every 6 (six) hours as needed for wheezing or shortness of breath. 75 mL 12  . albuterol (VENTOLIN HFA) 108 (90 Base) MCG/ACT inhaler Inhale 2 puffs into the lungs every 4 (four) hours as needed for wheezing. 1 g 5  . amLODipine (NORVASC) 10 MG tablet Take 10 mg by mouth daily.   2  . aspirin EC 81 MG tablet Take 81 mg by mouth daily.    Marland Kitchen atorvastatin (LIPITOR) 20 MG tablet TAKE 1 TABLET(20 MG) BY MOUTH DAILY (Patient taking differently: Take 20 mg by mouth daily.) 90 tablet 1  . fenofibrate (TRICOR) 145 MG tablet TAKE 1 TABLET(145 MG) BY MOUTH DAILY (Patient taking differently: 145 mg.) 90 tablet 1  . fluticasone (FLONASE) 50 MCG/ACT nasal spray Place 1-2 sprays into both nostrils daily as needed for rhinitis or allergies.    Marland Kitchen gabapentin (NEURONTIN) 600 MG tablet Take  600 mg by mouth 3 (three) times daily.    . hydrochlorothiazide (MICROZIDE) 12.5 MG capsule Take 1 capsule (12.5 mg total) by mouth daily. 30 capsule 0  . icosapent Ethyl (VASCEPA) 1 g capsule Take 2 capsules (2 g total) by mouth 2 (two) times daily. 360 capsule 0  . losartan (COZAAR) 50 MG tablet Take 1 tablet (50 mg total) by mouth daily. 30 tablet 0  . metoprolol succinate (TOPROL-XL) 50 MG 24 hr tablet TAKE 1 TABLET(50 MG) BY MOUTH DAILY WITH OR IMMEDIATELY FOLLOWING A MEAL (Patient taking differently: Take 50 mg by mouth daily.) 90 tablet 3  . Multiple Vitamin (MULTIVITAMIN ADULT PO) Take 1 tablet by mouth daily.    . pantoprazole (PROTONIX) 40 MG tablet Take 40 mg by mouth daily.    . Pirfenidone (ESBRIET) 267 MG TABS Take 3 tablets (801 mg total) by mouth in the morning, at noon, and at bedtime. 270 tablet 11  . SPIRIVA RESPIMAT 1.25 MCG/ACT AERS INHALE 2 PUFFS INTO THE LUNGS DAILY 4 g 5  . SYMBICORT 160-4.5 MCG/ACT inhaler Inhale 2  puffs into the lungs 2 (two) times daily. 1 Inhaler 5  . TOUJEO SOLOSTAR 300 UNIT/ML SOPN Inject 40-50 Units into the skin at bedtime. 50 units  in th A.M. and  40 units subcutaneous in the P.M.  1  . busPIRone (BUSPAR) 10 MG tablet Take 10 mg by mouth as needed.      Home: Home Living Family/patient expects to be discharged to:: Private residence Living Arrangements: Children Available Help at Discharge: Family,Available 24 hours/day Type of Home: Apartment Home Access: Stairs to enter Entrance Stairs-Number of Steps: 14 Entrance Stairs-Rails: Left Home Layout: One level Bathroom Shower/Tub: Tub/shower unit,Curtain Biochemist, clinical: Handicapped height Bathroom Accessibility: Yes Home Equipment: Shower seat,Cane - single point  Lives With: Son  Functional History: Prior Function Level of Independence: Independent with assistive device(s) Comments: did her own medication management. uses cane for mobility Functional Status:  Mobility: Bed Mobility Overal bed mobility: Needs Assistance Bed Mobility: Supine to Sit,Sit to Supine Supine to sit: Mod assist,+2 for physical assistance,+2 for safety/equipment Sit to supine: Mod assist,+2 for physical assistance,+2 for safety/equipment General bed mobility comments: Required assist for trunk and LEs. Increased time on stretcher Transfers Overall transfer level: Needs assistance Equipment used: Rolling walker (2 wheeled),2 person hand held assist Transfers: Sit to/from Stand Sit to Stand: Max assist,+2 physical assistance General transfer comment: Assist for lift and steadying to stand and EOB. Used RW, however, pt with increased knee buckling, so transitioned to 2 person HHA. Required manual blocking of LLE. difficulty taking steps at EOB.      ADL: ADL Overall ADL's : Needs assistance/impaired Eating/Feeding: Set up Grooming: Minimal assistance,Sitting Upper Body Bathing: Minimal assistance,Sitting Lower Body Bathing: Maximal  assistance,Sit to/from stand Upper Body Dressing : Moderate assistance,Sitting Lower Body Dressing: Maximal assistance,Sit to/from stand Toilet Transfer: Maximal assistance,+2 for physical assistance (simulated) Toileting- Clothing Manipulation and Hygiene: Maximal assistance Functional mobility during ADLs: Maximal assistance,+2 for physical assistance General ADL Comments: fear of falling  Cognition: Cognition Overall Cognitive Status: Impaired/Different from baseline Arousal/Alertness: Awake/alert Orientation Level: Oriented X4 Attention: Focused Focused Attention: Appears intact Memory: Impaired Memory Impairment: Decreased short term memory,Decreased recall of new information Decreased Short Term Memory: Verbal basic Awareness: Appears intact Problem Solving: Impaired Problem Solving Impairment: Verbal basic Executive Function: Reasoning,Organizing,Decision Making Reasoning: Impaired Reasoning Impairment: Verbal basic Organizing: Impaired Organizing Impairment: Verbal basic Decision Making: Impaired Decision Making Impairment: Verbal basic Cognition Arousal/Alertness:  Awake/alert Behavior During Therapy: WFL for tasks assessed/performed Overall Cognitive Status: Impaired/Different from baseline Area of Impairment: Attention,Awareness,Safety/judgement,Problem solving,Following commands,Memory Current Attention Level: Selective Memory: Decreased recall of precautions,Decreased short-term memory Following Commands: Follows one step commands with increased time Safety/Judgement: Decreased awareness of deficits,Decreased awareness of safety Awareness: Emergent Problem Solving: Slow processing General Comments: Impaired awareness of safety and deficits. Difficulty following instructions to perform visual field tracking. Slow processing  Blood pressure 139/67, pulse 66, temperature 97.6 F (36.4 C), temperature source Oral, resp. rate 16, height _0  (1.549 m), weight 83.8  kg, SpO2 100 %. Physical Exam Neurological:     Comments: Patient is alert in no acute distress.  Makes eye contact with examiner.  Follows simple commands.  Oriented to person and place.    General: No acute distress Mood and affect are appropriate Heart: Regular rate and rhythm no rubs murmurs or extra sounds Lungs: Clear to auscultation, breathing unlabored, no rales or wheezes Abdomen: Positive bowel sounds, soft nontender to palpation, nondistended Extremities: No clubbing, cyanosis, or edema Skin: No evidence of breakdown, no evidence of rash Neurologic: Cranial nerves II through XII intact, motor strength is 5/5 in right and 4/5 left deltoid, bicep, tricep, grip, hip flexor, knee extensors, ankle dorsiflexor and plantar flexor Sensory exam normal sensation to light touch bilateral upper and lower extremities, patient feels the left side is cold and tingly compared to the right side Cerebellar exam normal finger to nose to finger as well as heel to shin in bilateral upper and lower extremities Musculoskeletal: Full range of motion in all 4 extremities. No joint swelling   Results for orders placed or performed during the hospital encounter of 06/25/20 (from the past 24 hour(s))  Glucose, capillary     Status: Abnormal   Collection Time: 06/27/20  9:07 PM  Result Value Ref Range   Glucose-Capillary 155 (H) 70 - 99 mg/dL  Basic metabolic panel     Status: Abnormal   Collection Time: 06/28/20  3:16 AM  Result Value Ref Range   Sodium 141 135 - 145 mmol/L   Potassium 3.9 3.5 - 5.1 mmol/L   Chloride 112 (H) 98 - 111 mmol/L   CO2 21 (L) 22 - 32 mmol/L   Glucose, Bld 153 (H) 70 - 99 mg/dL   BUN 29 (H) 8 - 23 mg/dL   Creatinine, Ser 1.54 (H) 0.44 - 1.00 mg/dL   Calcium 8.7 (L) 8.9 - 10.3 mg/dL   GFR, Estimated 37 (L) >60 mL/min   Anion gap 8 5 - 15  Magnesium     Status: None   Collection Time: 06/28/20  3:16 AM  Result Value Ref Range   Magnesium 1.9 1.7 - 2.4 mg/dL  CBC      Status: Abnormal   Collection Time: 06/28/20  3:16 AM  Result Value Ref Range   WBC 3.8 (L) 4.0 - 10.5 K/uL   RBC 3.14 (L) 3.87 - 5.11 MIL/uL   Hemoglobin 10.2 (L) 12.0 - 15.0 g/dL   HCT 28.5 (L) 36.0 - 46.0 %   MCV 90.8 80.0 - 100.0 fL   MCH 32.5 26.0 - 34.0 pg   MCHC 35.8 30.0 - 36.0 g/dL   RDW 17.2 (H) 11.5 - 15.5 %   Platelets 171 150 - 400 K/uL   nRBC 0.0 0.0 - 0.2 %  Glucose, capillary     Status: None   Collection Time: 06/28/20  6:16 AM  Result Value Ref Range   Glucose-Capillary 84 70 -  99 mg/dL  Glucose, capillary     Status: Abnormal   Collection Time: 06/28/20 11:22 AM  Result Value Ref Range   Glucose-Capillary 162 (H) 70 - 99 mg/dL   Comment 1 Notify RN    Comment 2 Document in Chart   Glucose, capillary     Status: Abnormal   Collection Time: 06/28/20  5:02 PM  Result Value Ref Range   Glucose-Capillary 142 (H) 70 - 99 mg/dL   No results found.   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 Does the need for close, 24 hr/day medical supervision in concert with the patient's rehab needs make it unreasonable for this patient to be served in a less intensive setting? Yes 2. Co-Morbidities requiring supervision/potential complications: COPD, history of rectal CA, hypertension, hyperlipidemia, history of cocaine abuse 3. Due to bladder management, bowel management, safety, skin/wound care, disease management, medication administration, pain management and patient education, does the patient require 24 hr/day rehab nursing? Yes 4. Does the patient require coordinated care of a physician, rehab nurse, therapy disciplines of PT, OT to address physical and functional deficits in the context of the above medical diagnosis(es)? Yes Addressing deficits in the following areas: balance, endurance, locomotion, strength, transferring, bowel/bladder control, bathing, dressing, toileting and psychosocial  support 5. Can the patient actively participate in an intensive therapy program of at least 3 hrs of therapy per day at least 5 days per week? Yes 6. The potential for patient to make measurable gains while on inpatient rehab is excellent 7. Anticipated functional outcomes upon discharge from inpatient rehab are modified independent and supervision  with PT, modified independent and supervision with OT, n/a with SLP. 8. Estimated rehab length of stay to reach the above functional goals is: 7 to 10 days 9. Anticipated discharge destination: Home 10. Overall Rehab/Functional Prognosis: excellent  RECOMMENDATIONS: This patient's condition is appropriate for continued rehabilitative care in the following setting: CIR Patient has agreed to participate in recommended program. Yes Note that insurance prior authorization may be required for reimbursement for recommended care.  Comment:   Cathlyn Parsons, PA-C 06/28/2020  "I have personally performed a face to face diagnostic evaluation of this patient.  Additionally, I have reviewed and concur with the physician assistant's documentation above." Charlett Blake M.D. [place] FAAPM&R Diplomate Am Board of Electrodiagnostic Med Fellow Am Board of Interventional Pain

## 2020-06-29 DIAGNOSIS — E7800 Pure hypercholesterolemia, unspecified: Secondary | ICD-10-CM | POA: Diagnosis not present

## 2020-06-29 DIAGNOSIS — I6320 Cerebral infarction due to unspecified occlusion or stenosis of unspecified precerebral arteries: Secondary | ICD-10-CM

## 2020-06-29 LAB — GLUCOSE, CAPILLARY
Glucose-Capillary: 114 mg/dL — ABNORMAL HIGH (ref 70–99)
Glucose-Capillary: 132 mg/dL — ABNORMAL HIGH (ref 70–99)
Glucose-Capillary: 154 mg/dL — ABNORMAL HIGH (ref 70–99)
Glucose-Capillary: 244 mg/dL — ABNORMAL HIGH (ref 70–99)
Glucose-Capillary: 84 mg/dL (ref 70–99)

## 2020-06-29 MED ORDER — HYDROXYZINE HCL 25 MG PO TABS
25.0000 mg | ORAL_TABLET | Freq: Two times a day (BID) | ORAL | Status: DC
  Administered 2020-06-29 – 2020-06-30 (×2): 25 mg via ORAL
  Filled 2020-06-29 (×2): qty 1

## 2020-06-29 MED ORDER — METOPROLOL SUCCINATE ER 50 MG PO TB24
50.0000 mg | ORAL_TABLET | Freq: Every day | ORAL | Status: DC
Start: 2020-06-29 — End: 2020-06-30
  Administered 2020-06-29 – 2020-06-30 (×2): 50 mg via ORAL
  Filled 2020-06-29 (×2): qty 1

## 2020-06-29 MED ORDER — HYDROCHLOROTHIAZIDE 12.5 MG PO CAPS: 12.5000 mg | ORAL_CAPSULE | Freq: Every day | ORAL | Status: DC

## 2020-06-29 MED ORDER — LOSARTAN POTASSIUM 50 MG PO TABS: 50.0000 mg | ORAL_TABLET | Freq: Every day | ORAL | Status: DC

## 2020-06-29 MED ORDER — AMLODIPINE BESYLATE 10 MG PO TABS
10.0000 mg | ORAL_TABLET | Freq: Every day | ORAL | Status: DC
Start: 2020-06-29 — End: 2020-06-30
  Administered 2020-06-29 – 2020-06-30 (×2): 10 mg via ORAL
  Filled 2020-06-29 (×2): qty 1

## 2020-06-29 MED ORDER — ESCITALOPRAM OXALATE 10 MG PO TABS
10.0000 mg | ORAL_TABLET | Freq: Every day | ORAL | Status: DC
  Administered 2020-06-30: 10 mg via ORAL
  Filled 2020-06-29: qty 1

## 2020-06-29 NOTE — Progress Notes (Signed)
Occupational Therapy Treatment Patient Details Name: Erica [name] MRN: [id number] DOB: [birth year] Today's Date: 06/29/2020    History of present illness Pt is a 65 y/o female admitted secondary to fall and L sided weakness. 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 PMH includes COPD, MDD with suicide attempt, HTN, CAD, substance abuse and DM.   OT comments  OT treatment session with focus on self-care re-education, ADL transfers, and short-distance functional mobility with use of RW. Patient able to ambulate to commode in bathroom with use of RW and Min cues for walker management including side steps through bathroom door. Patient able to complete 3/3 parts of toileting with Min A and LB dressing to don underwear with Min A and cues for safety. Patient continues to be limited by L-sided weakness, decreased balance, decreased knowledge of precautions, and decreased safety awareness. Patient is very motivated and would greatly benefit from CIR. OT will continue to follow acutely.    Follow Up Recommendations  CIR;Supervision/Assistance - 24 hour    Equipment Recommendations  3 in 1 bedside commode    Recommendations for Other Services Rehab consult    Precautions / Restrictions Precautions Precautions: Fall Restrictions Weight Bearing Restrictions: No       Mobility Bed Mobility Overal bed mobility: Needs Assistance             General bed mobility comments: Patient seated in recliner upon entry.  Transfers Overall transfer level: Needs assistance Equipment used: Rolling walker (2 wheeled) Transfers: Sit to/from Stand Sit to Stand: Min assist;Min guard         General transfer comment: Min A from low surfaces and Min guard from elevated surfaces.    Balance Overall balance assessment: Needs assistance Sitting-balance support: Feet supported;No upper extremity supported Sitting balance-Leahy Scale: Good Sitting balance - Comments: Patient able to maintain  sitting balance while threading BLE through LB clothing.   Standing balance support: During functional activity Standing balance-Leahy Scale: Fair Standing balance comment: Able to maintain standing balance with unilateral UE support on RW during hygiene/clothing management.                           ADL either performed or assessed with clinical judgement   ADL                           Toilet Transfer: Minimal assistance;RW   Toileting- Clothing Manipulation and Hygiene: Minimal assistance;Sit to/from stand       Functional mobility during ADLs: Minimal assistance;Rolling walker General ADL Comments: Min A for short distance functional mobility with RW. Mod cues for walker management.     Vision       Perception     Praxis      Cognition Arousal/Alertness: Awake/alert Behavior During Therapy: WFL for tasks assessed/performed Overall Cognitive Status: Impaired/Different from baseline Area of Impairment: Safety/judgement;Awareness                     Memory: Decreased recall of precautions   Safety/Judgement: Decreased awareness of deficits;Decreased awareness of safety Awareness: Emergent   General Comments: Impaired awareness of safety and deficits. "Leave that RW right by my chair so i can reach it." "I can do it by myself," when told how to safely get back to bed by calling nurse. Joking appropriately with therapists. Highly motivated.        Exercises  Shoulder Instructions       General Comments VSS    Pertinent Vitals/ Pain       Pain Assessment: No/denies pain Faces Pain Scale: Hurts little more Pain Location: bil knees; headache Pain Descriptors / Indicators: Aching;Sore Pain Intervention(s): Monitored during session  Home Living                                          Prior Functioning/Environment              Frequency  Min 2X/week        Progress Toward Goals  OT Goals(current  goals can now be found in the care plan section)  Progress towards OT goals: Progressing toward goals  Acute Rehab OT Goals Patient Stated Goal: to get stronger and be independent OT Goal Formulation: With patient/family Time For Goal Achievement: 07/10/20 Potential to Achieve Goals: Good ADL Goals Pt Will Perform Grooming: with modified independence;standing Pt Will Perform Upper Body Dressing: with modified independence;sitting Pt Will Perform Lower Body Dressing: with modified independence;sit to/from stand Pt Will Transfer to Toilet: with modified independence;ambulating;regular height toilet Pt Will Perform Toileting - Clothing Manipulation and hygiene: with modified independence;sit to/from stand Additional ADL Goal #1: Patient will demonstrate good safety awareness during ADLs/ADL transfers with LRAD demonstrating decreased fall risk.  Plan Discharge plan remains appropriate;Frequency remains appropriate    Co-evaluation                 AM-PAC OT "6 Clicks" Daily Activity     Outcome Measure   Help from another person eating meals?: A Little Help from another person taking care of personal grooming?: A Little Help from another person toileting, which includes using toliet, bedpan, or urinal?: A Little Help from another person bathing (including washing, rinsing, drying)?: A Lot Help from another person to put on and taking off regular upper body clothing?: A Little Help from another person to put on and taking off regular lower body clothing?: A Little 6 Click Score: 17    End of Session Equipment Utilized During Treatment: Gait belt;Rolling walker  OT Visit Diagnosis: Unsteadiness on feet (R26.81);Other abnormalities of gait and mobility (R26.89);Muscle weakness (generalized) (M62.81);History of falling (Z91.81);Other symptoms and signs involving the nervous system (R29.898);Other symptoms and signs involving cognitive function;Pain;Hemiplegia and  hemiparesis Hemiplegia - Right/Left: Left Hemiplegia - dominant/non-dominant: Non-Dominant Hemiplegia - caused by: Cerebral infarction   Activity Tolerance Patient tolerated treatment well   Patient Left in chair;with call bell/phone within reach;with chair alarm set   Nurse Communication          Time: 1320-1340 OT Time Calculation (min): 20 min  Charges: OT General Charges $OT Visit: 1 Visit OT Treatments $Self Care/Home Management : 8-22 mins  Erica [name] H. OTR/L Supplemental OT, Department of rehab services [phone]   Janeil Schexnayder R H. 06/29/2020, 1:51 PM

## 2020-06-29 NOTE — Progress Notes (Addendum)
Inpatient Rehab Admissions Coordinator:   I do not have a CIR bed for this Pt. Today. I opened a case with her insurance yesterday but have not yet received a response. I attempted to notify patient via her mobile phone and room phone  But was not able to reach her. I will follow for potential admit later this week pending bed availability and insurance auth.Clemens Catholic, [place], [place] Admissions Coordinator  [phone] ([place]) [phone] (office)

## 2020-06-29 NOTE — Plan of Care (Signed)
  Problem: Education: Goal: Knowledge of disease or condition will improve Outcome: Progressing Goal: Knowledge of secondary prevention will improve Outcome: Progressing Goal: Knowledge of patient specific risk factors addressed and post discharge goals established will improve Outcome: Progressing   Problem: Coping: Goal: Will verbalize positive feelings about self Outcome: Progressing Goal: Will identify appropriate support needs Outcome: Progressing   Problem: Health Behavior/Discharge Planning: Goal: Ability to manage health-related needs will improve Outcome: Progressing   Problem: Self-Care: Goal: Ability to participate in self-care as condition permits will improve Outcome: Progressing Goal: Verbalization of feelings and concerns over difficulty with self-care will improve Outcome: Progressing   Problem: Ischemic Stroke/TIA Tissue Perfusion: Goal: Complications of ischemic stroke/TIA will be minimized Outcome: Progressing

## 2020-06-29 NOTE — Progress Notes (Addendum)
Progress Note    Erica [name]  LAG:[id number] DOB: [birth year]  DOA: 06/25/2020 PCP: Nolene Ebbs, MD      Brief Narrative:    Medical records reviewed and are as summarized below:  Erica [name] is a 65 y.o. female with a past medical history significant for hypertension,hypercholesterolemia,diabetes complicated by neuropathy, coronary artery disease,depression with prior suicide attempt, arthritis, 10-pack-year history of smoking (quit in 2020), COPD/asthma, crack/cocaine use, who presented to the hospital with 1 week history of slurred speech 1 day history of left-sided weakness resulting in a fall.  She initially went to see her PCP who referred her to the emergency room for further evaluation.   Work-up in the ED was notable for multifocal strokes in the right caudate and right thalamus, thought to be cardioembolic versus aortic arch atheroembolic.      Assessment/Plan:   Principal Problem:   CVA (cerebral vascular accident) ([place]) Active Problems:   Interstitial lung disease (HCC)   COPD (chronic obstructive pulmonary disease) (HCC)   GERD (gastroesophageal reflux disease)   MDD (major depressive disorder), recurrent severe, without psychosis ([place])   Acute CVA (cerebrovascular accident) ([place])   Body mass index is 34.91 kg/m.  (Obesity)    CVA Plan is for DAPT with aspirin and Plavix for 3 weeks then continuation with Plavix 75 mg alone She was loaded with Plavix on 06/26/2020.  She will continue Plavix until 07/17/2020 Continue Lipitor. Awaiting placement to CIR.  COPD/ILD Continue pirfenidone (Esbriet) Continue bronchodilators  HTN Resume amlodipine and metoprolol HCTZ and lisinopril still on hold.  AKI Creatinine is slowly improving.  Repeat BMP tomorrow.  GERD Continue Protonix daily  Depression with history of suicide attempt Continue BuSpar and Neurontin  Diabetes Hold Toujeo Solostar Continue Lantus and NovoLog as  needed.    Patient says she takes hydroxyzine 25 mg twice daily and escitalopram 10 mg daily at home.  This would not on her admission med rec.  Patient requested that these medicines be ordered    Diet Order            Diet regular Room service appropriate? Yes; Fluid consistency: Thin  Diet effective now                    Consultants:  Neurologist  Physiatrist  Procedures:  None    Medications:   . aspirin EC  81 mg Oral Daily  . atorvastatin  40 mg Oral Daily  . busPIRone  10 mg Oral Q0600  . clopidogrel  75 mg Oral Daily  . enoxaparin (LOVENOX) injection  40 mg Subcutaneous Q24H  . fenofibrate  160 mg Oral Daily  . gabapentin  300 mg Oral TID  . icosapent Ethyl  2 g Oral BID  . insulin aspart  0-15 Units Subcutaneous TID WC  . insulin glargine  25 Units Subcutaneous BID  . mometasone-formoterol  2 puff Inhalation BID  . pantoprazole  40 mg Oral Daily  . Pirfenidone  3 tablet Oral TID  . umeclidinium bromide  1 puff Inhalation Daily   Continuous Infusions:   Anti-infectives (From admission, onward)   None             Family Communication/Anticipated D/C date and plan/Code Status   DVT prophylaxis: enoxaparin (LOVENOX) injection 40 mg Start: 06/28/20 2030     Code Status: Full Code  Family Communication: None Disposition Plan:    Status is: Inpatient  Remains inpatient appropriate because:Unsafe d/c plan  Dispo: The patient is from: Home              Anticipated d/c is to: CIR              Anticipated d/c date is: 2 days              Patient currently is medically stable to d/c.           Subjective:   C/o weakness in the left leg.  Objective:    Vitals:   06/29/20 0314 06/29/20 0801 06/29/20 0837 06/29/20 1217  BP: (!) 172/76  (!) 166/81 (!) 153/89  Pulse: 63  60 64  Resp: _0 Temp: 98.1 F (36.7 C)  98 F (36.7 C) 98 F (36.7 C)  TempSrc: Oral  Oral Oral  SpO2: 96% 100% 100% 100%  Weight:       Height:       No data found.   Intake/Output Summary (Last 24 hours) at 06/29/2020 1505 Last data filed at 06/29/2020 0405 Gross per 24 hour  Intake --  Output 800 ml  Net -800 ml   Filed Weights   06/25/20 2200  Weight: 83.8 kg    Exam:  GEN: NAD SKIN: No rash EYES: EOMI ENT: MMM CV: RRR PULM: CTA B ABD: soft, ND, NT, +BS CNS: AAO x 3, left hemiparesis (power 4/5) EXT: No edema or tenderness   Data Reviewed:   I have personally reviewed following labs and imaging studies:  Labs: Labs show the following:   Basic Metabolic Panel: Recent Labs  Lab 06/25/20 1525 06/25/20 1700 06/25/20 1724 06/28/20 0316  NA 137 136 138 141  K 4.3 5.0 5.8* 3.9  CL 105 106 109 112*  CO2 23 20*  --  21*  GLUCOSE 138* 128* 126* 153*  BUN 41* 40* 63* 29*  CREATININE 1.79* 1.90* 1.90* 1.54*  CALCIUM 9.2 9.2  --  8.7*  MG  --   --   --  1.9   GFR Estimated Creatinine Clearance: 35.3 mL/min (A) (by C-G formula based on SCr of 1.54 mg/dL (H)). Liver Function Tests: Recent Labs  Lab 06/25/20 1525 06/25/20 1700  AST 20 63*  ALT 12 20  ALKPHOS  --  65  BILITOT 1.1 0.4  PROT 8.3* 7.9  ALBUMIN  --  3.7   No results for input(s): LIPASE, AMYLASE in the last 168 hours. No results for input(s): AMMONIA in the last 168 hours. Coagulation profile Recent Labs  Lab 06/25/20 1700  INR 1.2    CBC: Recent Labs  Lab 06/25/20 1525 06/25/20 1700 06/25/20 1724 06/28/20 0316  WBC 6.2 5.9  --  3.8*  NEUTROABS  --  4.4  --   --   HGB 11.3* 11.1* 10.9* 10.2*  HCT 32.0* 31.3* 32.0* 28.5*  MCV 91.4 93.4  --  90.8  PLT 212 169  --  171   Cardiac Enzymes: No results for input(s): CKTOTAL, CKMB, CKMBINDEX, TROPONINI in the last 168 hours. BNP (last 3 results) No results for input(s): PROBNP in the last 8760 hours. CBG: Recent Labs  Lab 06/28/20 1702 06/28/20 2138 06/29/20 0628 06/29/20 0837 06/29/20 1214  GLUCAP 142* 193* 132* 84 114*   D-Dimer: No results for  input(s): DDIMER in the last 72 hours. Hgb A1c: No results for input(s): HGBA1C in the last 72 hours. Lipid Profile: No results for input(s): CHOL, HDL, LDLCALC, TRIG, CHOLHDL, LDLDIRECT in the last 72 hours. Thyroid function  studies: No results for input(s): TSH, T4TOTAL, T3FREE, THYROIDAB in the last 72 hours.  Invalid input(s): FREET3 Anemia work up: No results for input(s): VITAMINB12, FOLATE, FERRITIN, TIBC, IRON, RETICCTPCT in the last 72 hours. Sepsis Labs: Recent Labs  Lab 06/25/20 1525 06/25/20 1700 06/28/20 0316  WBC 6.2 5.9 3.8*    Microbiology Recent Results (from the past 240 hour(s))  SARS CORONAVIRUS 2 (TAT 6-24 HRS) Nasopharyngeal Nasopharyngeal Swab     Status: None   Collection Time: 06/25/20  6:21 PM   Specimen: Nasopharyngeal Swab  Result Value Ref Range Status   SARS Coronavirus 2 NEGATIVE NEGATIVE Final    Comment: (NOTE) SARS-CoV-2 target nucleic acids are NOT DETECTED.  The SARS-CoV-2 RNA is generally detectable in upper and lower respiratory specimens during the acute phase of infection. Negative results do not preclude SARS-CoV-2 infection, do not rule out co-infections with other pathogens, and should not be used as the sole basis for treatment or other patient management decisions. Negative results must be combined with clinical observations, patient history, and epidemiological information. The expected result is Negative.  Fact Sheet for Patients: SugarRoll.be  Fact Sheet for Healthcare Providers: https://www.woods-mathews.com/  This test is not yet approved or cleared by the [place] FDA and  has been authorized for detection and/or diagnosis of SARS-CoV-2 by FDA under an Emergency Use Authorization (EUA). This EUA will remain  in effect (meaning this test can be used) for the duration of the COVID-19 declaration under Se ction 564(b)(1) of the Act, 21 U.S.C. section 360bbb-3(b)(1), unless  the authorization is terminated or revoked sooner.  Performed at [facility], [place] [address], [place], [place] [address]     Procedures and diagnostic studies:  No results found.             LOS: 3 days   Ivon Roedel  [facility]   Pager on [email or web address]. If 7PM-7AM, please contact night-coverage at [email or web address]     06/29/2020, 3:05 PM

## 2020-06-29 NOTE — Progress Notes (Signed)
Physical Therapy Treatment Patient Details Name: Erica [name] MRN: [id number] DOB: 05/27/1956 Today's Date: 06/29/2020    History of Present Illness Pt is a 65 y/o female admitted secondary to fall and L sided weakness. 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 PMH includes COPD, MDD with suicide attempt, HTN, CAD, substance abuse and DM.    PT Comments    Patient progressing well towards PT goals. Tolerated gait training today with Min A for balance/safety and use of RW for support. Continues to have left knee instability throughout gait worsened when fatigued requiring seated and standing rest breaks. Pt with poor awareness of safety/deficits. Motivated to get to rehab and return to independence. Will continue to follow.   Follow Up Recommendations  CIR     Equipment Recommendations  Rolling walker with 5" wheels    Recommendations for Other Services       Precautions / Restrictions Precautions Precautions: Fall Restrictions Weight Bearing Restrictions: No    Mobility  Bed Mobility               General bed mobility comments: Sitting on toilet upon PT arrival.  Transfers Overall transfer level: Needs assistance Equipment used: Rolling walker (2 wheeled) Transfers: Sit to/from Stand Sit to Stand: Min assist;Min guard         General transfer comment: Min A-Min guard for safety to stand from toilet x1, from chair x2. No evidence of knee buckling during transition.  Ambulation/Gait Ambulation/Gait assistance: Min assist Gait Distance (Feet): 120 Feet Assistive device: Rolling walker (2 wheeled) Gait Pattern/deviations: Step-to pattern;Step-through pattern;Decreased stance time - left;Decreased step length - right Gait velocity: decreased Gait velocity interpretation: <1.8 ft/sec, indicate of risk for recurrent falls General Gait Details: Slow, unsteady gait with left knee instability; cues for step through gait pattern and RW proximity/management. 2  seated rest breaks due to LLE fatigue. Requires standing rest breaks due to UE soreness due to heavy use of them during gait.   Stairs             Wheelchair Mobility    Modified Rankin (Stroke Patients Only) Modified Rankin (Stroke Patients Only) Pre-Morbid Rankin Score: No significant disability Modified Rankin: Moderately severe disability     Balance Overall balance assessment: Needs assistance Sitting-balance support: Feet supported;No upper extremity supported Sitting balance-Leahy Scale: Good     Standing balance support: During functional activity Standing balance-Leahy Scale: Fair Standing balance comment: Able to stand at sink and wash hands with close min guard, favoring right leg.                            Cognition Arousal/Alertness: Awake/alert Behavior During Therapy: WFL for tasks assessed/performed Overall Cognitive Status: Impaired/Different from baseline Area of Impairment: Safety/judgement;Memory;Awareness                     Memory: Decreased recall of precautions;Decreased short-term memory   Safety/Judgement: Decreased awareness of deficits;Decreased awareness of safety Awareness: Emergent   General Comments: Impaired awareness of safety and deficits. "Leave that RW right by my chair so i can reach it." "I can do it by myself," when told how to safely get back to bed by calling nurse. Joking appropriately with therapists. Highly motivated.      Exercises      General Comments        Pertinent Vitals/Pain Pain Assessment: Faces Faces Pain Scale: Hurts little more Pain Location: bil knees;  headache Pain Descriptors / Indicators: Aching;Sore Pain Intervention(s): Monitored during session;Repositioned    Home Living                      Prior Function            PT Goals (current goals can now be found in the care plan section) Progress towards PT goals: Progressing toward goals    Frequency     Min 4X/week      PT Plan Current plan remains appropriate    Co-evaluation              AM-PAC PT "6 Clicks" Mobility   Outcome Measure  Help needed turning from your back to your side while in a flat bed without using bedrails?: A Little Help needed moving from lying on your back to sitting on the side of a flat bed without using bedrails?: A Little Help needed moving to and from a bed to a chair (including a wheelchair)?: A Little Help needed standing up from a chair using your arms (e.g., wheelchair or bedside chair)?: A Little Help needed to walk in hospital room?: A Little Help needed climbing 3-5 steps with a railing? : A Lot 6 Click Score: 17    End of Session Equipment Utilized During Treatment: Gait belt Activity Tolerance: Patient tolerated treatment well Patient left: in chair;with call bell/phone within reach;with chair alarm set Nurse Communication: Mobility status PT Visit Diagnosis: Hemiplegia and hemiparesis Hemiplegia - Right/Left: Left Hemiplegia - dominant/non-dominant: Non-dominant Hemiplegia - caused by: Cerebral infarction     Time: 4458-4835 PT Time Calculation (min) (ACUTE ONLY): 24 min  Charges:  $Gait Training: 23-37 mins                     Marisa Severin, PT, DPT Acute Rehabilitation Services Pager [phone] Office [phone]       Marguarite Arbour A Sabra Heck 06/29/2020, 12:48 PM

## 2020-06-29 NOTE — Plan of Care (Signed)

## 2020-06-30 ENCOUNTER — Encounter (HOSPITAL_COMMUNITY): Payer: Self-pay | Admitting: Internal Medicine

## 2020-06-30 ENCOUNTER — Inpatient Hospital Stay (HOSPITAL_COMMUNITY)
Admission: RE | Admit: 2020-06-30 | Discharge: 2020-07-15 | DRG: 057 | Disposition: A | Discharge status: Home Health | Payer: Medicare HMO | Source: Intra-hospital | Attending: Physical Medicine & Rehabilitation | Admitting: Physical Medicine & Rehabilitation

## 2020-06-30 ENCOUNTER — Encounter (HOSPITAL_COMMUNITY): Payer: Self-pay | Admitting: Physical Medicine & Rehabilitation

## 2020-06-30 ENCOUNTER — Telehealth (HOSPITAL_COMMUNITY): Payer: Self-pay | Admitting: *Deleted

## 2020-06-30 ENCOUNTER — Other Ambulatory Visit: Payer: Self-pay

## 2020-06-30 DIAGNOSIS — I6381 Other cerebral infarction due to occlusion or stenosis of small artery: Secondary | ICD-10-CM

## 2020-06-30 DIAGNOSIS — I69993 Ataxia following unspecified cerebrovascular disease: Secondary | ICD-10-CM

## 2020-06-30 DIAGNOSIS — R0989 Other specified symptoms and signs involving the circulatory and respiratory systems: Secondary | ICD-10-CM

## 2020-06-30 DIAGNOSIS — I2584 Coronary atherosclerosis due to calcified coronary lesion: Secondary | ICD-10-CM

## 2020-06-30 DIAGNOSIS — J449 Chronic obstructive pulmonary disease, unspecified: Secondary | ICD-10-CM | POA: Diagnosis present

## 2020-06-30 DIAGNOSIS — E785 Hyperlipidemia, unspecified: Secondary | ICD-10-CM | POA: Diagnosis present

## 2020-06-30 DIAGNOSIS — I69328 Other speech and language deficits following cerebral infarction: Secondary | ICD-10-CM

## 2020-06-30 DIAGNOSIS — D61818 Other pancytopenia: Secondary | ICD-10-CM | POA: Diagnosis present

## 2020-06-30 DIAGNOSIS — E781 Pure hyperglyceridemia: Secondary | ICD-10-CM

## 2020-06-30 DIAGNOSIS — I69393 Ataxia following cerebral infarction: Secondary | ICD-10-CM | POA: Diagnosis not present

## 2020-06-30 DIAGNOSIS — J8410 Pulmonary fibrosis, unspecified: Secondary | ICD-10-CM | POA: Diagnosis present

## 2020-06-30 DIAGNOSIS — R197 Diarrhea, unspecified: Secondary | ICD-10-CM | POA: Diagnosis not present

## 2020-06-30 DIAGNOSIS — E1122 Type 2 diabetes mellitus with diabetic chronic kidney disease: Secondary | ICD-10-CM | POA: Diagnosis present

## 2020-06-30 DIAGNOSIS — W1830XD Fall on same level, unspecified, subsequent encounter: Secondary | ICD-10-CM

## 2020-06-30 DIAGNOSIS — E669 Obesity, unspecified: Secondary | ICD-10-CM

## 2020-06-30 DIAGNOSIS — Z7982 Long term (current) use of aspirin: Secondary | ICD-10-CM

## 2020-06-30 DIAGNOSIS — Z794 Long term (current) use of insulin: Secondary | ICD-10-CM

## 2020-06-30 DIAGNOSIS — I639 Cerebral infarction, unspecified: Secondary | ICD-10-CM

## 2020-06-30 DIAGNOSIS — E1142 Type 2 diabetes mellitus with diabetic polyneuropathy: Secondary | ICD-10-CM

## 2020-06-30 DIAGNOSIS — R7309 Other abnormal glucose: Secondary | ICD-10-CM

## 2020-06-30 DIAGNOSIS — I69392 Facial weakness following cerebral infarction: Secondary | ICD-10-CM

## 2020-06-30 DIAGNOSIS — K219 Gastro-esophageal reflux disease without esophagitis: Secondary | ICD-10-CM | POA: Diagnosis present

## 2020-06-30 DIAGNOSIS — M1990 Unspecified osteoarthritis, unspecified site: Secondary | ICD-10-CM | POA: Diagnosis present

## 2020-06-30 DIAGNOSIS — Z9049 Acquired absence of other specified parts of digestive tract: Secondary | ICD-10-CM

## 2020-06-30 DIAGNOSIS — E1169 Type 2 diabetes mellitus with other specified complication: Secondary | ICD-10-CM

## 2020-06-30 DIAGNOSIS — F419 Anxiety disorder, unspecified: Secondary | ICD-10-CM | POA: Diagnosis present

## 2020-06-30 DIAGNOSIS — I69354 Hemiplegia and hemiparesis following cerebral infarction affecting left non-dominant side: Secondary | ICD-10-CM | POA: Diagnosis present

## 2020-06-30 DIAGNOSIS — F332 Major depressive disorder, recurrent severe without psychotic features: Secondary | ICD-10-CM | POA: Diagnosis present

## 2020-06-30 DIAGNOSIS — Z87891 Personal history of nicotine dependence: Secondary | ICD-10-CM

## 2020-06-30 DIAGNOSIS — J410 Simple chronic bronchitis: Secondary | ICD-10-CM

## 2020-06-30 DIAGNOSIS — Z7951 Long term (current) use of inhaled steroids: Secondary | ICD-10-CM

## 2020-06-30 DIAGNOSIS — D649 Anemia, unspecified: Secondary | ICD-10-CM

## 2020-06-30 DIAGNOSIS — E11649 Type 2 diabetes mellitus with hypoglycemia without coma: Secondary | ICD-10-CM | POA: Diagnosis not present

## 2020-06-30 DIAGNOSIS — Z8249 Family history of ischemic heart disease and other diseases of the circulatory system: Secondary | ICD-10-CM

## 2020-06-30 DIAGNOSIS — Z85048 Personal history of other malignant neoplasm of rectum, rectosigmoid junction, and anus: Secondary | ICD-10-CM

## 2020-06-30 DIAGNOSIS — I129 Hypertensive chronic kidney disease with stage 1 through stage 4 chronic kidney disease, or unspecified chronic kidney disease: Secondary | ICD-10-CM | POA: Diagnosis present

## 2020-06-30 DIAGNOSIS — Z833 Family history of diabetes mellitus: Secondary | ICD-10-CM

## 2020-06-30 DIAGNOSIS — N1832 Chronic kidney disease, stage 3b: Secondary | ICD-10-CM

## 2020-06-30 DIAGNOSIS — N179 Acute kidney failure, unspecified: Secondary | ICD-10-CM

## 2020-06-30 DIAGNOSIS — I2510 Atherosclerotic heart disease of native coronary artery without angina pectoris: Secondary | ICD-10-CM

## 2020-06-30 DIAGNOSIS — I10 Essential (primary) hypertension: Secondary | ICD-10-CM | POA: Diagnosis not present

## 2020-06-30 DIAGNOSIS — Z83438 Family history of other disorder of lipoprotein metabolism and other lipidemia: Secondary | ICD-10-CM

## 2020-06-30 DIAGNOSIS — E1159 Type 2 diabetes mellitus with other circulatory complications: Secondary | ICD-10-CM

## 2020-06-30 DIAGNOSIS — Z6833 Body mass index (BMI) 33.0-33.9, adult: Secondary | ICD-10-CM

## 2020-06-30 DIAGNOSIS — Z9151 Personal history of suicidal behavior: Secondary | ICD-10-CM

## 2020-06-30 DIAGNOSIS — Z880 Allergy status to penicillin: Secondary | ICD-10-CM

## 2020-06-30 DIAGNOSIS — E7800 Pure hypercholesterolemia, unspecified: Secondary | ICD-10-CM | POA: Diagnosis not present

## 2020-06-30 DIAGNOSIS — Z79899 Other long term (current) drug therapy: Secondary | ICD-10-CM

## 2020-06-30 LAB — BASIC METABOLIC PANEL
Anion gap: 7 (ref 5–15)
BUN: 30 mg/dL — ABNORMAL HIGH (ref 8–23)
CO2: 22 mmol/L (ref 22–32)
Calcium: 9 mg/dL (ref 8.9–10.3)
Chloride: 111 mmol/L (ref 98–111)
Creatinine, Ser: 1.74 mg/dL — ABNORMAL HIGH (ref 0.44–1.00)
GFR, Estimated: 32 mL/min — ABNORMAL LOW (ref >60)
Glucose, Bld: 135 mg/dL — ABNORMAL HIGH (ref 70–99)
Potassium: 4.1 mmol/L (ref 3.5–5.1)
Sodium: 140 mmol/L (ref 135–145)

## 2020-06-30 LAB — GLUCOSE, CAPILLARY
Glucose-Capillary: 99 mg/dL (ref 70–99)
Glucose-Capillary: 95 mg/dL (ref 70–99)
Glucose-Capillary: 237 mg/dL — ABNORMAL HIGH (ref 70–99)
Glucose-Capillary: 190 mg/dL — ABNORMAL HIGH (ref 70–99)

## 2020-06-30 MED ORDER — ASPIRIN EC 81 MG PO TBEC
81.0000 mg | DELAYED_RELEASE_TABLET | Freq: Every day | ORAL | Status: DC
  Administered 2020-07-01 – 2020-07-15 (×15): 81 mg via ORAL
  Filled 2020-06-30 (×15): qty 1

## 2020-06-30 MED ORDER — ALBUTEROL SULFATE (2.5 MG/3ML) 0.083% IN NEBU: 2.5000 mg | INHALATION_SOLUTION | RESPIRATORY_TRACT | Status: DC | PRN

## 2020-06-30 MED ORDER — ENOXAPARIN SODIUM 40 MG/0.4ML ~~LOC~~ SOLN
40.0000 mg | SUBCUTANEOUS | Status: DC
  Administered 2020-06-30 – 2020-07-09 (×10): 40 mg via SUBCUTANEOUS
  Filled 2020-06-30 (×10): qty 0.4

## 2020-06-30 MED ORDER — SENNOSIDES-DOCUSATE SODIUM 8.6-50 MG PO TABS: 1.0000 | ORAL_TABLET | Freq: Every evening | ORAL | Status: DC | PRN

## 2020-06-30 MED ORDER — CLOPIDOGREL BISULFATE 75 MG PO TABS
75.0000 mg | ORAL_TABLET | Freq: Every day | ORAL | Status: DC
  Administered 2020-07-01 – 2020-07-15 (×15): 75 mg via ORAL
  Filled 2020-06-30 (×15): qty 1

## 2020-06-30 MED ORDER — GABAPENTIN 300 MG PO CAPS
300.0000 mg | ORAL_CAPSULE | Freq: Three times a day (TID) | ORAL | Status: DC
  Administered 2020-06-30 – 2020-07-15 (×44): 300 mg via ORAL
  Filled 2020-06-30 (×44): qty 1

## 2020-06-30 MED ORDER — MOMETASONE FURO-FORMOTEROL FUM 200-5 MCG/ACT IN AERO
2.0000 | INHALATION_SPRAY | Freq: Two times a day (BID) | RESPIRATORY_TRACT | Status: DC
  Administered 2020-06-30 – 2020-07-14 (×27): 2 via RESPIRATORY_TRACT
  Filled 2020-06-30: qty 8.8

## 2020-06-30 MED ORDER — TOUJEO SOLOSTAR 300 UNIT/ML ~~LOC~~ SOPN: 25.0000 [IU] | PEN_INJECTOR | Freq: Two times a day (BID) | SUBCUTANEOUS | 1 refills | Status: DC

## 2020-06-30 MED ORDER — PANTOPRAZOLE SODIUM 40 MG PO TBEC
40.0000 mg | DELAYED_RELEASE_TABLET | Freq: Every day | ORAL | Status: DC
  Administered 2020-07-01 – 2020-07-15 (×15): 40 mg via ORAL
  Filled 2020-06-30 (×15): qty 1

## 2020-06-30 MED ORDER — LOSARTAN POTASSIUM 50 MG PO TABS
50.0000 mg | ORAL_TABLET | Freq: Every day | ORAL | Status: DC
  Administered 2020-07-01: 50 mg via ORAL
  Filled 2020-06-30: qty 1

## 2020-06-30 MED ORDER — CLOPIDOGREL BISULFATE 75 MG PO TABS: 75.0000 mg | ORAL_TABLET | Freq: Every day | ORAL | 0 refills | Status: DC

## 2020-06-30 MED ORDER — ESCITALOPRAM OXALATE 10 MG PO TABS: 10.0000 mg | ORAL_TABLET | Freq: Every day | ORAL | Status: DC

## 2020-06-30 MED ORDER — ACETAMINOPHEN 650 MG RE SUPP: 650.0000 mg | RECTAL | Status: DC | PRN

## 2020-06-30 MED ORDER — HYDROXYZINE HCL 25 MG PO TABS
25.0000 mg | ORAL_TABLET | Freq: Two times a day (BID) | ORAL | Status: DC
  Administered 2020-06-30 – 2020-07-15 (×30): 25 mg via ORAL
  Filled 2020-06-30 (×30): qty 1

## 2020-06-30 MED ORDER — ENOXAPARIN SODIUM 40 MG/0.4ML ~~LOC~~ SOLN: 40.0000 mg | SUBCUTANEOUS | Status: DC

## 2020-06-30 MED ORDER — LIVING WELL WITH DIABETES BOOK
Freq: Once | Status: AC
  Filled 2020-06-30: qty 1

## 2020-06-30 MED ORDER — PIRFENIDONE 267 MG PO TABS
3.0000 | ORAL_TABLET | Freq: Three times a day (TID) | ORAL | Status: DC
  Administered 2020-06-30 – 2020-07-15 (×44): 801 mg via ORAL
  Filled 2020-06-30 (×22): qty 3

## 2020-06-30 MED ORDER — METOPROLOL SUCCINATE ER 50 MG PO TB24
50.0000 mg | ORAL_TABLET | Freq: Every day | ORAL | Status: DC
  Administered 2020-07-01 – 2020-07-15 (×15): 50 mg via ORAL
  Filled 2020-06-30 (×15): qty 1

## 2020-06-30 MED ORDER — ATORVASTATIN CALCIUM 40 MG PO TABS: 40.0000 mg | ORAL_TABLET | Freq: Every day | ORAL | Status: DC

## 2020-06-30 MED ORDER — ICOSAPENT ETHYL 1 G PO CAPS
2.0000 g | ORAL_CAPSULE | Freq: Two times a day (BID) | ORAL | Status: DC
  Administered 2020-06-30 – 2020-07-15 (×31): 2 g via ORAL
  Filled 2020-06-30 (×32): qty 2

## 2020-06-30 MED ORDER — INSULIN GLARGINE 100 UNIT/ML ~~LOC~~ SOLN
25.0000 [IU] | Freq: Two times a day (BID) | SUBCUTANEOUS | Status: DC
  Administered 2020-06-30 – 2020-07-05 (×10): 25 [IU] via SUBCUTANEOUS
  Filled 2020-06-30 (×12): qty 0.25

## 2020-06-30 MED ORDER — ACETAMINOPHEN 325 MG PO TABS
650.0000 mg | ORAL_TABLET | ORAL | Status: DC | PRN
  Administered 2020-06-30 – 2020-07-12 (×3): 650 mg via ORAL
  Filled 2020-06-30 (×4): qty 2

## 2020-06-30 MED ORDER — UMECLIDINIUM BROMIDE 62.5 MCG/INH IN AEPB
1.0000 | INHALATION_SPRAY | Freq: Every day | RESPIRATORY_TRACT | Status: DC
  Administered 2020-07-02 – 2020-07-14 (×12): 1 via RESPIRATORY_TRACT
  Filled 2020-06-30 (×2): qty 7

## 2020-06-30 MED ORDER — ATORVASTATIN CALCIUM 40 MG PO TABS
40.0000 mg | ORAL_TABLET | Freq: Every day | ORAL | Status: DC
  Administered 2020-07-01 – 2020-07-15 (×15): 40 mg via ORAL
  Filled 2020-06-30 (×15): qty 1

## 2020-06-30 MED ORDER — BLOOD PRESSURE CONTROL BOOK
Freq: Once | Status: AC
  Filled 2020-06-30: qty 1

## 2020-06-30 MED ORDER — BUSPIRONE HCL 5 MG PO TABS
10.0000 mg | ORAL_TABLET | Freq: Every day | ORAL | Status: DC
  Administered 2020-07-01 – 2020-07-15 (×13): 10 mg via ORAL
  Filled 2020-06-30 (×15): qty 2

## 2020-06-30 MED ORDER — INSULIN ASPART 100 UNIT/ML ~~LOC~~ SOLN
0.0000 [IU] | Freq: Three times a day (TID) | SUBCUTANEOUS | Status: DC
  Administered 2020-06-30 – 2020-07-01 (×2): 5 [IU] via SUBCUTANEOUS
  Administered 2020-07-01 – 2020-07-02 (×2): 3 [IU] via SUBCUTANEOUS
  Administered 2020-07-02: 5 [IU] via SUBCUTANEOUS
  Administered 2020-07-03 – 2020-07-04 (×2): 2 [IU] via SUBCUTANEOUS
  Administered 2020-07-05: 3 [IU] via SUBCUTANEOUS
  Administered 2020-07-06: 2 [IU] via SUBCUTANEOUS
  Administered 2020-07-06: 5 [IU] via SUBCUTANEOUS
  Administered 2020-07-08 (×2): 3 [IU] via SUBCUTANEOUS
  Administered 2020-07-09: 2 [IU] via SUBCUTANEOUS
  Administered 2020-07-09 – 2020-07-11 (×3): 3 [IU] via SUBCUTANEOUS
  Administered 2020-07-12 (×2): 2 [IU] via SUBCUTANEOUS
  Administered 2020-07-13: 5 [IU] via SUBCUTANEOUS
  Administered 2020-07-14: 2 [IU] via SUBCUTANEOUS
  Administered 2020-07-14: 3 [IU] via SUBCUTANEOUS

## 2020-06-30 MED ORDER — GABAPENTIN 600 MG PO TABS: 300.0000 mg | ORAL_TABLET | Freq: Two times a day (BID) | ORAL | Status: DC

## 2020-06-30 MED ORDER — ESCITALOPRAM OXALATE 10 MG PO TABS
10.0000 mg | ORAL_TABLET | Freq: Every day | ORAL | Status: DC
  Administered 2020-07-01 – 2020-07-05 (×5): 10 mg via ORAL
  Filled 2020-06-30 (×5): qty 1

## 2020-06-30 MED ORDER — HYDROXYZINE HCL 25 MG PO TABS: 25.0000 mg | ORAL_TABLET | Freq: Two times a day (BID) | ORAL | 0 refills | Status: DC

## 2020-06-30 MED ORDER — AMLODIPINE BESYLATE 10 MG PO TABS
10.0000 mg | ORAL_TABLET | Freq: Every day | ORAL | Status: DC
  Administered 2020-07-01 – 2020-07-15 (×15): 10 mg via ORAL
  Filled 2020-06-30 (×15): qty 1

## 2020-06-30 MED ORDER — ASPIRIN EC 81 MG PO TBEC: 81.0000 mg | DELAYED_RELEASE_TABLET | Freq: Every day | ORAL | Status: DC

## 2020-06-30 MED ORDER — ACETAMINOPHEN 160 MG/5ML PO SOLN: 650.0000 mg | ORAL | Status: DC | PRN

## 2020-06-30 MED ORDER — ONDANSETRON HCL 4 MG/2ML IJ SOLN
4.0000 mg | Freq: Four times a day (QID) | INTRAMUSCULAR | Status: DC | PRN
  Administered 2020-06-30: 4 mg via INTRAVENOUS
  Filled 2020-06-30: qty 2

## 2020-06-30 MED ORDER — FENOFIBRATE 160 MG PO TABS
160.0000 mg | ORAL_TABLET | Freq: Every day | ORAL | Status: DC
  Administered 2020-07-01 – 2020-07-15 (×15): 160 mg via ORAL
  Filled 2020-06-30 (×15): qty 1

## 2020-06-30 NOTE — Progress Notes (Signed)
Report called and given to 4W RN. Ready for transport from 3W.

## 2020-06-30 NOTE — Progress Notes (Signed)
PMR Admission Coordinator Pre-Admission Assessment  Patient: Erica [name] is an 65 y.o., female MRN: [id number] DOB: [birth year] Height: 5' 1" (154.9 cm) Weight: 83.8 kg                                                                                                                                                  Insurance Information HMO: yes    PPO:      PCP:      IPA:      80/20:      OTHER:  PRIMARY: [employer]      Policy#: [id number]      Subscriber: pt CM Name: Lestine Box      Phone#: [phone] ext [phone]     Fax#: [phone] Received approval for admission from [facility]  On 1/17 for admission on 1/17 with 5 day window to admit. Requests team conference notes within 7 days of admission.  Pre-Cert#: [id number]      Employer: n/a Benefits:  Phone #:     Name:  Eff. Date:  06/13/2016 - Present    Deductible: :does not have for in-network providers plan is under dual eligible special needs plan with Medicaid Cost Share Protection OOP Max: $3,450 ($0 met) CIR: $0 co-pay, 100% coverage, 0% co-insurance SNF: $0 co-pay 1-100 days, 100% coverage, 0% co-insurance Outpatient: 100% coverage, 0% co-insurance       Home Health:  100% coverage, 0% co-insurance; limited by medical necessity DME: 100% coverage, 0% co-insurance Providers: in network  SECONDARY: none   Policy#:       Phone#:    Development worker, community:       Phone#:   The Engineer, [employer]" for patients in Inpatient Rehabilitation Facilities with attached "Privacy Act [place] Records" was provided and verbally reviewed with: Patient  Emergency Contact Information         Contact Information    Name Relation Home Work Mobile   Kelly Sister [phone]     [place], [place] [phone]       Current Medical History  Patient Admitting Diagnosis: CVA   History of Present Illness: lealer marsland is a 65 year old right-handed female with history of hypertension, rectal cancer,  hyperlipidemia diabetes mellitus, COPD as well as cocaine use. Per chart review lives with her children. 1 level home 4 steps to entry. Independent with assistive device. She has a sister who checks on her routinely. Presented 06/25/2020 with left-sided weakness slurred speech as well as a fall. Patient had reportedly recent episode of questionable syncope. CT/MRI showed a 1 cm acute infarction in the anterior lateral thalamus on the right with some involvement of the posterior limb internal capsule. Acute infarction within the right caudate head. Scattered other foci of high signal diffusion imaging within the hemispheric white matter. Extensive old small vessel infarctions throughout the brain. Patient did  not receive tPA. MRA was unremarkable. 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 Moderate C6-7 and mild C5-6 and C7-T1 spinal canal stenosis secondary to central disc protrusions. Admission chemistries unremarkable except glucose 128 creatinine 1.90 SARS coronavirus negative urine drug screen negative. Carotid Dopplers with no ICA stenosis. Echocardiogram with ejection fraction of 55 to 60% no wall motion abnormalities. Maintained on Plavix and aspirin for CVA prophylaxis x3 weeks then Plavix alone. Lovenox for DVT prophylaxis. Tolerating a regular consistency diet. Due to patient's left-sided weakness and slurred speech physical medicine rehab consult was requested and patient was admitted for a comprehensive rehab program.  Complete NIHSS TOTAL: 1 Glasgow Coma Scale Score: 15  Past Medical History      Past Medical History:  Diagnosis Date  . Arthritis    especially in shoulders  . Asthma   . Depression   . Diabetes mellitus   . GERD (gastroesophageal reflux disease)   . Headache(784.0)    "mild"  . Hypertension   . Mental disorder    depression  . Neuropathy    feet   . Pain     arthritis pain - takes tramadol as needed  . Rectal polyp    very little bleeding with bowel movements- no pain    Family History  family history includes Cancer in her maternal aunt, maternal uncle, maternal uncle, maternal uncle, paternal aunt, and paternal uncle; Cancer (age of onset: 1) in an other family member; Diabetes in her father; Heart disease in her mother; Hyperlipidemia in her mother; Hypertension in her brother, brother, father, mother, sister, and sister; Stroke in her father.  Prior Rehab/Hospitalizations:  Has the patient had prior rehab or hospitalizations prior to admission? No  Has the patient had major surgery during 100 days prior to admission? No  Current Medications   Current Facility-Administered Medications:  .  acetaminophen (TYLENOL) tablet 650 mg, 650 mg, Oral, Q4H PRN, 650 mg at 06/29/20 1651 **OR** acetaminophen (TYLENOL) 160 MG/5ML solution 650 mg, 650 mg, Per Tube, Q4H PRN **OR** acetaminophen (TYLENOL) suppository 650 mg, 650 mg, Rectal, Q4H PRN, Marcelyn Bruins, MD .  albuterol (PROVENTIL) (2.5 MG/3ML) 0.083% nebulizer solution 2.5 mg, 2.5 mg, Nebulization, Q4H PRN, Bonnell Public Tublu, MD .  amLODipine (NORVASC) tablet 10 mg, 10 mg, Oral, Daily, Jennye Boroughs, MD, 10 mg at 06/30/20 1026 .  aspirin EC tablet 81 mg, 81 mg, Oral, Daily, Marcelyn Bruins, MD, 81 mg at 06/30/20 1026 .  atorvastatin (LIPITOR) tablet 40 mg, 40 mg, Oral, Daily, Rosalin Hawking, MD, 40 mg at 06/30/20 1025 .  busPIRone (BUSPAR) tablet 10 mg, 10 mg, Oral, Q0600, Vashti Hey, MD, 10 mg at 06/30/20 0641 .  clopidogrel (PLAVIX) tablet 75 mg, 75 mg, Oral, Daily, Dagar, Anjali, MD, 75 mg at 06/30/20 1025 .  enoxaparin (LOVENOX) injection 40 mg, 40 mg, Subcutaneous, Q24H, Hammons, Kimberly B, RPH, 40 mg at 06/29/20 2316 .  escitalopram (LEXAPRO) tablet 10 mg, 10 mg, Oral, Daily, Jennye Boroughs, MD, 10 mg at 06/30/20 1026 .  fenofibrate tablet 160 mg, 160  mg, Oral, Daily, Marcelyn Bruins, MD, 160 mg at 06/30/20 1026 .  gabapentin (NEURONTIN) capsule 300 mg, 300 mg, Oral, TID, Marcelyn Bruins, MD, 300 mg at 06/30/20 1026 .  hydrOXYzine (ATARAX/VISTARIL) tablet 25 mg, 25 mg, Oral, BID, Jennye Boroughs, MD, 25 mg at 06/30/20 1026 .  icosapent Ethyl (VASCEPA) 1 g capsule 2 g, 2 g, Oral, BID,  Marcelyn Bruins, MD, 2 g at 06/30/20 1025 .  insulin aspart (novoLOG) injection 0-15 Units, 0-15 Units, Subcutaneous, TID WC, Marcelyn Bruins, MD, 5 Units at 06/29/20 1650 .  insulin glargine (LANTUS) injection 25 Units, 25 Units, Subcutaneous, BID, Marcelyn Bruins, MD, 25 Units at 06/30/20 1026 .  metoprolol succinate (TOPROL-XL) 24 hr tablet 50 mg, 50 mg, Oral, Daily, Jennye Boroughs, MD, 50 mg at 06/30/20 1026 .  mometasone-formoterol (DULERA) 200-5 MCG/ACT inhaler 2 puff, 2 puff, Inhalation, BID, Marcelyn Bruins, MD, 2 puff at 06/30/20 [phone] .  pantoprazole (PROTONIX) EC tablet 40 mg, 40 mg, Oral, Daily, Marcelyn Bruins, MD, 40 mg at 06/30/20 1026 .  Pirfenidone TABS 801 mg, 3 tablet, Oral, TID, Marcelyn Bruins, MD, 801 mg at 06/30/20 1034 .  senna-docusate (Senokot-S) tablet 1 tablet, 1 tablet, Oral, QHS PRN, Marcelyn Bruins, MD .  umeclidinium bromide (INCRUSE ELLIPTA) 62.5 MCG/INH 1 puff, 1 puff, Inhalation, Daily, Marcelyn Bruins, MD  Patients Current Diet:  Diet Order                  Diet regular Room service appropriate? Yes; Fluid consistency: Thin  Diet effective now                  Precautions / Restrictions Precautions Precautions: Fall Restrictions Weight Bearing Restrictions: No   Has the patient had 2 or more falls or a fall with injury in the past year?No  Prior Activity Level Community (5-7x/wk): Pt was avtive in the community PTA  Prior Functional Level Prior Function Level of Independence: Independent with assistive device(s) Comments: did her own medication management. uses  cane for mobility  Self Care: Did the patient need help bathing, dressing, using the toilet or eating?  Independent  Indoor Mobility: Did the patient need assistance with walking from room to room (with or without device)? Independent  Stairs: Did the patient need assistance with internal or external stairs (with or without device)? Independent  Functional Cognition: Did the patient need help planning regular tasks such as shopping or remembering to take medications? Independent  Home Assistive Devices / Equipment Home Assistive Devices/Equipment: None Home Equipment: Shower seat,Cane - single point  Prior Device Use: Indicate devices/aids used by the patient prior to current illness, exacerbation or injury? Walker  Current Functional Level Cognition  Arousal/Alertness: Awake/alert Overall Cognitive Status: Impaired/Different from baseline Current Attention Level: Selective Orientation Level: Oriented X4 Following Commands: Follows one step commands with increased time Safety/Judgement: Decreased awareness of deficits,Decreased awareness of safety General Comments: Impaired awareness of safety and deficits. "Leave that RW right by my chair so i can reach it." "I can do it by myself," when told how to safely get back to bed by calling nurse. Joking appropriately with therapists. Highly motivated. Attention: Focused Focused Attention: Appears intact Memory: Impaired Memory Impairment: Decreased short term memory,Decreased recall of new information Decreased Short Term Memory: Verbal basic Awareness: Appears intact Problem Solving: Impaired Problem Solving Impairment: Verbal basic Executive Function: Reasoning,Organizing,Decision Making Reasoning: Impaired Reasoning Impairment: Verbal basic Organizing: Impaired Organizing Impairment: Verbal basic Decision Making: Impaired Decision Making Impairment: Verbal basic    Extremity Assessment (includes  Sensation/Coordination)  Upper Extremity Assessment: LUE deficits/detail LUE Deficits / Details: Apparent RTC insufficiency at baseline; ROM limited at shoujlder; apparent sensory motor deficits; able to touch finger to nose with increased time. poor inhand manipulation skills although able to complete full grip and oppose thumb to fingers LUE Sensation: decreased light  touch,decreased proprioception LUE Coordination: decreased fine motor,decreased gross motor  Lower Extremity Assessment: Defer to PT evaluation LLE Deficits / Details: L knee buckling    ADLs  Overall ADL's : Needs assistance/impaired Eating/Feeding: Set up Grooming: Minimal assistance,Sitting Upper Body Bathing: Minimal assistance,Sitting Lower Body Bathing: Maximal assistance,Sit to/from stand Upper Body Dressing : Moderate assistance,Sitting Lower Body Dressing: Maximal assistance,Sit to/from stand Toilet Transfer: Minimal assistance,RW Toileting- Clothing Manipulation and Hygiene: Minimal assistance,Sit to/from stand Functional mobility during ADLs: Minimal assistance,Rolling walker General ADL Comments: Min A for short distance functional mobility with RW. Mod cues for walker management.    Mobility  Overal bed mobility: Needs Assistance Bed Mobility: Supine to Sit,Sit to Supine Supine to sit: Mod assist,+2 for physical assistance,+2 for safety/equipment Sit to supine: Mod assist,+2 for physical assistance,+2 for safety/equipment General bed mobility comments: Patient seated in recliner upon entry.    Transfers  Overall transfer level: Needs assistance Equipment used: Rolling walker (2 wheeled) Transfers: Sit to/from Stand Sit to Stand: Min assist,Min guard General transfer comment: Min A from low surfaces and Min guard from elevated surfaces.    Ambulation / Gait / Stairs / Wheelchair Mobility  Ambulation/Gait Ambulation/Gait assistance: Herbalist (Feet): 120 Feet Assistive device:  Rolling walker (2 wheeled) Gait Pattern/deviations: Step-to pattern,Step-through pattern,Decreased stance time - left,Decreased step length - right General Gait Details: Slow, unsteady gait with left knee instability; cues for step through gait pattern and RW proximity/management. 2 seated rest breaks due to LLE fatigue. Requires standing rest breaks due to UE soreness due to heavy use of them during gait. Gait velocity: decreased Gait velocity interpretation: <1.8 ft/sec, indicate of risk for recurrent falls    Posture / Balance Dynamic Sitting Balance Sitting balance - Comments: Patient able to maintain sitting balance while threading BLE through LB clothing. Balance Overall balance assessment: Needs assistance Sitting-balance support: Feet supported,No upper extremity supported Sitting balance-Leahy Scale: Good Sitting balance - Comments: Patient able to maintain sitting balance while threading BLE through LB clothing. Standing balance support: During functional activity Standing balance-Leahy Scale: Fair Standing balance comment: Able to maintain standing balance with unilateral UE support on RW during hygiene/clothing management.    Special needs/care consideration  Diabetic Management: DM2 with sQ Lantus and Novolog,  and Designated visitor Yoselin Amerman (son)     Previous Home Environment (from acute therapy documentation) Living Arrangements: Children  Lives With: Son Available Help at Discharge: Family,Available 24 hours/day Type of Home: Apartment Home Layout: One level Home Access: Stairs to enter Entrance Stairs-Rails: Left Entrance Stairs-Number of Steps: 14 Bathroom Shower/Tub: Tub/shower unit,Curtain Biochemist, clinical: Handicapped height Bathroom Accessibility: Yes How Accessible: Accessible via walker [place]: No  Discharge Living Setting Plans for Discharge Living Setting: Patient's home Type of Home at Discharge: Apartment Discharge Home Layout:  One level Discharge Home Access: Stairs to enter Entrance Stairs-Rails: Left Entrance Stairs-Number of Steps: 14 Discharge Bathroom Shower/Tub: [place] unit Discharge Bathroom Toilet: Handicapped height Discharge Bathroom Accessibility: Yes How Accessible: Accessible via walker Does the patient have any problems obtaining your medications?: No  Social/Family/Support Systems Patient Roles: Other (Comment) Contact Information: [phone] Anticipated Caregiver: Freddy Spadafora (son) Spoke with him over the phone. He confirmed he can provide up to mod A support and supervision 24/7 at d/c Anticipated Caregiver's Contact Information: [phone] Ability/Limitations of Caregiver: Can provide mod A Caregiver Availability: 24/7 Discharge Plan Discussed with Primary Caregiver: Yes Is Caregiver In Agreement with Plan?: Yes Does Caregiver/Family have Issues with Lodging/Transportation while Pt  is in Rehab?: No   Goals Patient/Family Goal for Rehab: PT/OT/SLP Min A Expected length of stay: 7-10  days Pt/Family Agrees to Admission and willing to participate: Yes Program Orientation Provided & Reviewed with Pt/Caregiver Including Roles  & Responsibilities: Yes   Decrease burden of Care through IP rehab admission: Specialzed equipment needs, Decrease number of caregivers, Bowel and bladder program and Patient/family education   Possible need for SNF placement upon discharge:not anticipated  Patient Condition: This patient's condition remains as documented in the consult dated 06/30/2020, in which the Rehabilitation Physician determined and documented that the patient's condition is appropriate for intensive rehabilitative care in an inpatient rehabilitation facility. Will admit to inpatient rehab today.  Preadmission Screen Completed By:  Genella Mech, CCC-SLP, 06/30/2020 10:50 AM

## 2020-06-30 NOTE — H&P (Signed)
Physical Medicine and Rehabilitation Admission H&P    Chief Complaint  Patient presents with  . Stroke Symptoms  : HPI: Erica [name] is a 65 year old right-handed female with history of hypertension, rectal cancer, hyperlipidemia diabetes mellitus, COPD as well as cocaine use.  Per chart review lives with her children.  1 level home 4 steps to entry.  Independent with assistive device.  She has a sister who checks on her routinely.  Presented 06/25/2020 with left-sided weakness slurred speech as well as a fall.  Patient had reportedly recent episode of questionable syncope.  CT/MRI showed a 1 cm acute infarction in the anterior lateral thalamus on the right with some involvement of the posterior limb internal capsule.  Acute infarction within the right caudate head.  Scattered other foci of high signal diffusion imaging within the hemispheric white matter.  Extensive old small vessel infarctions throughout the brain.  Patient did not receive tPA.  MRA was unremarkable.  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  Moderate C6-7 and mild C5-6 and C7-T1 spinal canal stenosis secondary to central disc protrusions.  Admission chemistries unremarkable except glucose 128 creatinine 1.90 SARS coronavirus negative urine drug screen negative.  Carotid Dopplers with no ICA stenosis.  Echocardiogram with ejection fraction of 55 to 60% no wall motion abnormalities.  Maintained on Plavix and aspirin for CVA prophylaxis x3 weeks then Plavix alone.  Lovenox for DVT prophylaxis.  Tolerating a regular consistency diet.  Due to patient's left-sided weakness and slurred speech physical medicine rehab consult was requested and patient was admitted for a comprehensive rehab program.  Review of Systems  Constitutional: Negative for chills and fever.  HENT: Negative for hearing loss.   Eyes: Negative for blurred vision and double vision.  Respiratory:  Negative for cough and shortness of breath.   Cardiovascular: Negative for chest pain, palpitations and leg swelling.  Gastrointestinal: Positive for constipation. Negative for heartburn, nausea and vomiting.  Genitourinary: Negative for dysuria and flank pain.  Musculoskeletal: Positive for back pain, joint pain and myalgias.  Skin: Negative for rash.  Neurological: Positive for speech change, weakness and headaches.  Psychiatric/Behavioral: Positive for depression. The patient has insomnia.   All other systems reviewed and are negative.  Past Medical History:  Diagnosis Date  . Arthritis    especially in shoulders  . Asthma   . Depression   . Diabetes mellitus   . GERD (gastroesophageal reflux disease)   . Headache(784.0)    "mild"  . Hypertension   . Mental disorder    depression  . Neuropathy    feet   . Pain    arthritis pain - takes tramadol as needed  . Rectal polyp    very little bleeding with bowel movements- no pain   Past Surgical History:  Procedure Laterality Date  . ANAL RECTAL MANOMETRY N/A 07/13/2016   Procedure: ANO RECTAL MANOMETRY;  Surgeon: Leighton Ruff, MD;  Location: [facility];  Service: Endoscopy;  Laterality: N/A;  . CESAREAN SECTION    . EUS N/A 11/21/2012   Procedure: LOWER ENDOSCOPIC ULTRASOUND (EUS);  Surgeon: Arta Silence, MD;  Location: [facility];  Service: Endoscopy;  Laterality: N/A;  . FLEXIBLE SIGMOIDOSCOPY N/A 11/21/2012   Procedure: FLEXIBLE SIGMOIDOSCOPY;  Surgeon: Arta Silence, MD;  Location: [facility];  Service: Endoscopy;  Laterality: N/A;  . FLEXIBLE SIGMOIDOSCOPY N/A 01/28/2013   Procedure: FLEXIBLE SIGMOIDOSCOPY;  Surgeon: Leighton Ruff, MD;  Location: [facility];  Service: Endoscopy;  Laterality: N/A;  . LAPAROSCOPIC LOW ANTERIOR RESECTION N/A 01/29/2013   Procedure: LAPAROSCOPIC LOW ANTERIOR RESECTION, Rigid Proctoscopy;  Surgeon: Leighton Ruff, MD;  Location: [facility];  Service: General;  Laterality: N/A;  .  LAPAROSCOPIC SIGMOID COLECTOMY N/A 11/14/2012   Procedure: DIAGNOSTIC LAPAROSCOPY AND SIGMOIDMOIDOSCOPY ;  Surgeon: Rolm Bookbinder, MD;  Location: [facility];  Service: General;  Laterality: N/A;  . RECTAL ULTRASOUND N/A 07/13/2016   Procedure: RECTAL ULTRASOUND;  Surgeon: Leighton Ruff, MD;  Location: [facility];  Service: Endoscopy;  Laterality: N/A;  . TONSILLECTOMY    . TONSILLECTOMY AND ADENOIDECTOMY     Family History  Problem Relation Age of Onset  . Hypertension Father   . Stroke Father   . Diabetes Father   . Heart disease Mother   . Hypertension Mother   . Hyperlipidemia Mother   . Hypertension Sister   . Hypertension Brother   . Hypertension Sister   . Hypertension Brother   . Cancer Other 47       colon cancer   . Cancer Maternal Aunt        cancer, unknown type   . Cancer Maternal Uncle        bone cancer   . Cancer Paternal Aunt        lung cancer  . Cancer Paternal Uncle        lung cancer  . Cancer Maternal Uncle        colon cancer and prostate cancer   . Cancer Maternal Uncle        prostate cancer    Social History:  reports that she quit smoking about 2 years ago. Her smoking use included cigarettes. She has a 10.00 pack-year smoking history. She has never used smokeless tobacco. She reports previous alcohol use. She reports current drug use. Drug: "Crack" cocaine. Allergies:  Allergies  Allergen Reactions  . Penicillins Hives, Itching and Swelling    Tongue swells up Has patient had a PCN reaction causing immediate rash, facial/tongue/throat swelling, SOB or lightheadedness with hypotension: Yes Has patient had a PCN reaction causing severe rash involving mucus membranes or skin necrosis: Yes Has patient had a PCN reaction that required hospitalization Yes Has patient had a PCN reaction occurring within the last 10 years: No If all of the above answers are "NO", then may proceed with Cephalosporin use.    Medications Prior to Admission  Medication  Sig Dispense Refill  . albuterol (PROVENTIL) (2.5 MG/3ML) 0.083% nebulizer solution Take 3 mLs (2.5 mg total) by nebulization every 6 (six) hours as needed for wheezing or shortness of breath. 75 mL 12  . albuterol (VENTOLIN HFA) 108 (90 Base) MCG/ACT inhaler Inhale 2 puffs into the lungs every 4 (four) hours as needed for wheezing. 1 g 5  . amLODipine (NORVASC) 10 MG tablet Take 10 mg by mouth daily.   2  . aspirin EC 81 MG tablet Take 81 mg by mouth daily.    Marland Kitchen atorvastatin (LIPITOR) 20 MG tablet TAKE 1 TABLET(20 MG) BY MOUTH DAILY (Patient taking differently: Take 20 mg by mouth daily.) 90 tablet 1  . fenofibrate (TRICOR) 145 MG tablet TAKE 1 TABLET(145 MG) BY MOUTH DAILY (Patient taking differently: 145 mg.) 90 tablet 1  . fluticasone (FLONASE) 50 MCG/ACT nasal spray Place 1-2 sprays into both nostrils daily as needed for rhinitis or allergies.    Marland Kitchen gabapentin (NEURONTIN) 600 MG tablet Take 600 mg by mouth 3 (three) times daily.    Marland Kitchen  hydrochlorothiazide (MICROZIDE) 12.5 MG capsule Take 1 capsule (12.5 mg total) by mouth daily. 30 capsule 0  . icosapent Ethyl (VASCEPA) 1 g capsule Take 2 capsules (2 g total) by mouth 2 (two) times daily. 360 capsule 0  . losartan (COZAAR) 50 MG tablet Take 1 tablet (50 mg total) by mouth daily. 30 tablet 0  . metoprolol succinate (TOPROL-XL) 50 MG 24 hr tablet TAKE 1 TABLET(50 MG) BY MOUTH DAILY WITH OR IMMEDIATELY FOLLOWING A MEAL (Patient taking differently: Take 50 mg by mouth daily.) 90 tablet 3  . Multiple Vitamin (MULTIVITAMIN ADULT PO) Take 1 tablet by mouth daily.    . pantoprazole (PROTONIX) 40 MG tablet Take 40 mg by mouth daily.    . Pirfenidone (ESBRIET) 267 MG TABS Take 3 tablets (801 mg total) by mouth in the morning, at noon, and at bedtime. 270 tablet 11  . SPIRIVA RESPIMAT 1.25 MCG/ACT AERS INHALE 2 PUFFS INTO THE LUNGS DAILY 4 g 5  . SYMBICORT 160-4.5 MCG/ACT inhaler Inhale 2 puffs into the lungs 2 (two) times daily. 1 Inhaler 5  . TOUJEO  SOLOSTAR 300 UNIT/ML SOPN Inject 40-50 Units into the skin at bedtime. 50 units  in th A.M. and  40 units subcutaneous in the P.M.  1  . busPIRone (BUSPAR) 10 MG tablet Take 10 mg by mouth as needed.      Drug Regimen Review Drug regimen was reviewed and remains appropriate with no significant issues identified  Home: Home Living Family/patient expects to be discharged to:: Private residence Living Arrangements: Children Available Help at Discharge: Family,Available 24 hours/day Type of Home: Apartment Home Access: Stairs to enter Entrance Stairs-Number of Steps: 14 Entrance Stairs-Rails: Left Home Layout: One level Bathroom Shower/Tub: Tub/shower unit,Curtain Biochemist, clinical: Handicapped height Bathroom Accessibility: Yes Home Equipment: Shower seat,Cane - single point  Lives With: Son   Functional History: Prior Function Level of Independence: Independent with assistive device(s) Comments: did her own medication management. uses cane for mobility  Functional Status:  Mobility: Bed Mobility Overal bed mobility: Needs Assistance Bed Mobility: Supine to Sit,Sit to Supine Supine to sit: Mod assist,+2 for physical assistance,+2 for safety/equipment Sit to supine: Mod assist,+2 for physical assistance,+2 for safety/equipment General bed mobility comments: Patient seated in recliner upon entry. Transfers Overall transfer level: Needs assistance Equipment used: Rolling walker (2 wheeled) Transfers: Sit to/from Stand Sit to Stand: Min assist,Min guard General transfer comment: Min A from low surfaces and Min guard from elevated surfaces. Ambulation/Gait Ambulation/Gait assistance: Min assist Gait Distance (Feet): 120 Feet Assistive device: Rolling walker (2 wheeled) Gait Pattern/deviations: Step-to pattern,Step-through pattern,Decreased stance time - left,Decreased step length - right General Gait Details: Slow, unsteady gait with left knee instability; cues for step through  gait pattern and RW proximity/management. 2 seated rest breaks due to LLE fatigue. Requires standing rest breaks due to UE soreness due to heavy use of them during gait. Gait velocity: decreased Gait velocity interpretation: <1.8 ft/sec, indicate of risk for recurrent falls    ADL: ADL Overall ADL's : Needs assistance/impaired Eating/Feeding: Set up Grooming: Minimal assistance,Sitting Upper Body Bathing: Minimal assistance,Sitting Lower Body Bathing: Maximal assistance,Sit to/from stand Upper Body Dressing : Moderate assistance,Sitting Lower Body Dressing: Maximal assistance,Sit to/from stand Toilet Transfer: Minimal assistance,RW Toileting- Clothing Manipulation and Hygiene: Minimal assistance,Sit to/from stand Functional mobility during ADLs: Minimal assistance,Rolling walker General ADL Comments: Min A for short distance functional mobility with RW. Mod cues for walker management.  Cognition: Cognition Overall Cognitive Status: Impaired/Different from baseline Arousal/Alertness:  Awake/alert Orientation Level: Oriented X4 Attention: Focused Focused Attention: Appears intact Memory: Impaired Memory Impairment: Decreased short term memory,Decreased recall of new information Decreased Short Term Memory: Verbal basic Awareness: Appears intact Problem Solving: Impaired Problem Solving Impairment: Verbal basic Executive Function: Reasoning,Organizing,Decision Making Reasoning: Impaired Reasoning Impairment: Verbal basic Organizing: Impaired Organizing Impairment: Verbal basic Decision Making: Impaired Decision Making Impairment: Verbal basic Cognition Arousal/Alertness: Awake/alert Behavior During Therapy: WFL for tasks assessed/performed Overall Cognitive Status: Impaired/Different from baseline Area of Impairment: Safety/judgement,Awareness Current Attention Level: Selective Memory: Decreased recall of precautions Following Commands: Follows one step commands with  increased time Safety/Judgement: Decreased awareness of deficits,Decreased awareness of safety Awareness: Emergent Problem Solving: Slow processing General Comments: Impaired awareness of safety and deficits. "Leave that RW right by my chair so i can reach it." "I can do it by myself," when told how to safely get back to bed by calling nurse. Joking appropriately with therapists. Highly motivated.  Physical Exam: Blood pressure (!) 174/90, pulse 61, temperature 98.4 F (36.9 C), temperature source Oral, resp. rate 16, height _0  (1.549 m), weight 83.8 kg, SpO2 100 %. Physical Exam Constitutional:      Appearance: Normal appearance.     Comments: Obese; appears fatigued  HENT:     Head: Normocephalic and atraumatic.     Nose: Nose normal.     Mouth/Throat:     Mouth: Mucous membranes are moist.     Pharynx: Oropharynx is clear.  Eyes:     Extraocular Movements: Extraocular movements intact.     Pupils: Pupils are equal, round, and reactive to light.  Cardiovascular:     Rate and Rhythm: Normal rate and regular rhythm.     Pulses: Normal pulses.     Heart sounds: No murmur heard. No gallop.   Pulmonary:     Effort: Pulmonary effort is normal. No respiratory distress.     Breath sounds: No wheezing.  Abdominal:     General: Bowel sounds are normal. There is no distension.     Palpations: Abdomen is soft.     Tenderness: There is no abdominal tenderness.  Musculoskeletal:     Cervical back: Normal range of motion.  Skin:    General: Skin is warm and dry.  Neurological:     Mental Status: She is alert.     Comments: Patient is alert in no acute distress.  Makes eye contact with examiner. Fair insight and awareness. Answered biographical questions.  Oriented x3 and follows commands. Left face and left arm/hand 1/2 sensation. LLE 1+/2. RUE and RLE 2/2. Motor 4+/5 RUE and RLE. LUE 1+ prox to 2-/5 distally. LLE 2-/5 HF, KE and 1+/5 distally. DTR's 2++ LUE and LLE. DTR's 1+ RUE and  RLE  Psychiatric:        Mood and Affect: Mood normal.        Behavior: Behavior normal.     Results for orders placed or performed during the hospital encounter of 06/25/20 (from the past 48 hour(s))  Glucose, capillary     Status: Abnormal   Collection Time: 06/28/20 11:22 AM  Result Value Ref Range   Glucose-Capillary 162 (H) 70 - 99 mg/dL    Comment: Glucose reference range applies only to samples taken after fasting for at least 8 hours.   Comment 1 Notify RN    Comment 2 Document in Chart   Glucose, capillary     Status: Abnormal   Collection Time: 06/28/20  5:02 PM  Result Value Ref Range  Glucose-Capillary 142 (H) 70 - 99 mg/dL    Comment: Glucose reference range applies only to samples taken after fasting for at least 8 hours.  Glucose, capillary     Status: Abnormal   Collection Time: 06/28/20  9:38 PM  Result Value Ref Range   Glucose-Capillary 193 (H) 70 - 99 mg/dL    Comment: Glucose reference range applies only to samples taken after fasting for at least 8 hours.  Glucose, capillary     Status: Abnormal   Collection Time: 06/29/20  6:28 AM  Result Value Ref Range   Glucose-Capillary 132 (H) 70 - 99 mg/dL    Comment: Glucose reference range applies only to samples taken after fasting for at least 8 hours.  Glucose, capillary     Status: None   Collection Time: 06/29/20  8:37 AM  Result Value Ref Range   Glucose-Capillary 84 70 - 99 mg/dL    Comment: Glucose reference range applies only to samples taken after fasting for at least 8 hours.  Glucose, capillary     Status: Abnormal   Collection Time: 06/29/20 12:14 PM  Result Value Ref Range   Glucose-Capillary 114 (H) 70 - 99 mg/dL    Comment: Glucose reference range applies only to samples taken after fasting for at least 8 hours.  Glucose, capillary     Status: Abnormal   Collection Time: 06/29/20  4:39 PM  Result Value Ref Range   Glucose-Capillary 244 (H) 70 - 99 mg/dL    Comment: Glucose reference range  applies only to samples taken after fasting for at least 8 hours.  Glucose, capillary     Status: Abnormal   Collection Time: 06/29/20 11:31 PM  Result Value Ref Range   Glucose-Capillary 154 (H) 70 - 99 mg/dL    Comment: Glucose reference range applies only to samples taken after fasting for at least 8 hours.  Basic metabolic panel     Status: Abnormal   Collection Time: 06/30/20  5:13 AM  Result Value Ref Range   Sodium 140 135 - 145 mmol/L   Potassium 4.1 3.5 - 5.1 mmol/L   Chloride 111 98 - 111 mmol/L   CO2 22 22 - 32 mmol/L   Glucose, Bld 135 (H) 70 - 99 mg/dL    Comment: Glucose reference range applies only to samples taken after fasting for at least 8 hours.   BUN 30 (H) 8 - 23 mg/dL   Creatinine, Ser 1.74 (H) 0.44 - 1.00 mg/dL   Calcium 9.0 8.9 - 10.3 mg/dL   GFR, Estimated 32 (L) >60 mL/min    Comment: (NOTE) Calculated using the CKD-EPI Creatinine Equation (2021)    Anion gap 7 5 - 15    Comment: Performed at [facility] [address] [place], [place] [address]  Glucose, capillary     Status: None   Collection Time: 06/30/20  6:50 AM  Result Value Ref Range   Glucose-Capillary 99 70 - 99 mg/dL    Comment: Glucose reference range applies only to samples taken after fasting for at least 8 hours.   No results found.     Medical Problem List and Plan: 1. 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 Pt felt weaker today and evaluation reflected that.  -patient may shower  -ELOS/Goals: 7-12 days, min assist to supervision with PT, OT, SLP   2.  Antithrombotics: -DVT/anticoagulation: Lovenox  -antiplatelet therapy: Aspirin 81 mg daily  and Plavix 75 mg daily x3 weeks then Plavix alone 3. Pain Management: Neurontin 300 mg 3 times daily 4. Mood: Lexapro 10 mg daily, BuSpar 10 mg daily, Vistaril 25 mg twice daily  -antipsychotic agents: N/A 5. Neuropsych: This patient is capable of making  decisions on her own behalf. 6. Skin/Wound Care: Routine skin checks 7. Fluids/Electrolytes/Nutrition: Routine in and outs with follow-up chemistries 8.  Hypertension.  Norvasc 10 mg daily, Toprol-XL 50 mg daily 9.  Diabetes mellitus with peripheral neuropathy.  Hemoglobin A1c 5.2.  Lantus insulin 25 units twice daily.    -Check blood sugars before meals and at bedtime 10.  COPD/asthma Pirfenidone 801 mg 3 times daily, Ellipta inhaler 1 puff daily, Dulera 2 puffs twice daily  -Pirfenidone makes her nauseas, sometimes have diarrhea---treat supportively as she wants to stay on medication 11.  Hyperlipidemia.  Lipitor, fenofibrate,Vascepa 2 mg twice daily 12.  History of cocaine use remote tobacco abuse.  Urine drug screen negative 13.  GERD.  Protonix     Cathlyn Parsons, PA-C 06/30/2020

## 2020-06-30 NOTE — Care Management Important Message (Signed)
Important Message  Patient Details  Name: Erica [name] MRN: [id number] Date of Birth: 10-16-1955   Medicare Important Message Given:  Yes     Orbie Pyo 06/30/2020, 3:08 PM

## 2020-06-30 NOTE — Progress Notes (Signed)
Patient ID: Erica [name], female   DOB: September 23, 1955, 65 y.o.   MRN: [id number] Met with the patient to review role of the nurse CM and collaboration with SW to facilitate preparation for discharge. Reviewed secondary stroke risks and management of HTN, HLD, DM. DAPT for three weeks then Plavix solo per MD. Patient given handouts on DASH diet, dyslipidemia and booklets on HTN and DM management. Continue to follow along to discharge to address questions and concerns about medications and secondary stroke risks. Margarito Liner

## 2020-06-30 NOTE — Progress Notes (Signed)
Inpatient Rehab Admissions Coordinator:    I have a CIR bed for this Pt. Today. I have notified pt, reviewed insurance information, and received necessary consent. RN may call [phone] for report.  Clemens Catholic, [place], [place] Admissions Coordinator  [phone] ([place]) [phone] (office)

## 2020-06-30 NOTE — H&P (Signed)
Physical Medicine and Rehabilitation Admission H&P        Chief Complaint  Patient presents with  . Stroke Symptoms  : HPI: Erica [name] is a 65 year old right-handed female with history of hypertension, rectal cancer, hyperlipidemia diabetes mellitus, COPD as well as cocaine use.  Per chart review lives with her children.  1 level home 4 steps to entry.  Independent with assistive device.  She has a sister who checks on her routinely.  Presented 06/25/2020 with left-sided weakness slurred speech as well as a fall.  Patient had reportedly recent episode of questionable syncope.  CT/MRI showed a 1 cm acute infarction in the anterior lateral thalamus on the right with some involvement of the posterior limb internal capsule.  Acute infarction within the right caudate head.  Scattered other foci of high signal diffusion imaging within the hemispheric white matter.  Extensive old small vessel infarctions throughout the brain.  Patient did not receive tPA.  MRA was unremarkable.  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  Moderate C6-7 and mild C5-6 and C7-T1 spinal canal stenosis secondary to central disc protrusions.  Admission chemistries unremarkable except glucose 128 creatinine 1.90 SARS coronavirus negative urine drug screen negative.  Carotid Dopplers with no ICA stenosis.  Echocardiogram with ejection fraction of 55 to 60% no wall motion abnormalities.  Maintained on Plavix and aspirin for CVA prophylaxis x3 weeks then Plavix alone.  Lovenox for DVT prophylaxis.  Tolerating a regular consistency diet.  Due to patient's left-sided weakness and slurred speech physical medicine rehab consult was requested and patient was admitted for a comprehensive rehab program.   Review of Systems  Constitutional: Negative for chills and fever.  HENT: Negative for hearing loss.   Eyes: Negative for blurred vision and double vision.   Respiratory: Negative for cough and shortness of breath.   Cardiovascular: Negative for chest pain, palpitations and leg swelling.  Gastrointestinal: Positive for constipation. Negative for heartburn, nausea and vomiting.  Genitourinary: Negative for dysuria and flank pain.  Musculoskeletal: Positive for back pain, joint pain and myalgias.  Skin: Negative for rash.  Neurological: Positive for speech change, weakness and headaches.  Psychiatric/Behavioral: Positive for depression. The patient has insomnia.   All other systems reviewed and are negative.   Past Medical History:  Diagnosis Date  . Arthritis      especially in shoulders  . Asthma    . Depression    . Diabetes mellitus    . GERD (gastroesophageal reflux disease)    . Headache(784.0)      "mild"  . Hypertension    . Mental disorder      depression  . Neuropathy      feet   . Pain      arthritis pain - takes tramadol as needed  . Rectal polyp      very little bleeding with bowel movements- no pain         Past Surgical History:  Procedure Laterality Date  . ANAL RECTAL MANOMETRY N/A 07/13/2016    Procedure: ANO RECTAL MANOMETRY;  Surgeon: Leighton Ruff, MD;  Location: [facility];  Service: Endoscopy;  Laterality: N/A;  . CESAREAN SECTION      . EUS N/A 11/21/2012    Procedure: LOWER ENDOSCOPIC ULTRASOUND (EUS);  Surgeon: Arta Silence, MD;  Location: [facility];  Service: Endoscopy;  Laterality: N/A;  . FLEXIBLE SIGMOIDOSCOPY N/A 11/21/2012    Procedure: FLEXIBLE  SIGMOIDOSCOPY;  Surgeon: Arta Silence, MD;  Location: [facility];  Service: Endoscopy;  Laterality: N/A;  . FLEXIBLE SIGMOIDOSCOPY N/A 01/28/2013    Procedure: FLEXIBLE SIGMOIDOSCOPY;  Surgeon: Leighton Ruff, MD;  Location: [facility];  Service: Endoscopy;  Laterality: N/A;  . LAPAROSCOPIC LOW ANTERIOR RESECTION N/A 01/29/2013    Procedure: LAPAROSCOPIC LOW ANTERIOR RESECTION, Rigid Proctoscopy;  Surgeon: Leighton Ruff, MD;  Location: [facility];   Service: General;  Laterality: N/A;  . LAPAROSCOPIC SIGMOID COLECTOMY N/A 11/14/2012    Procedure: DIAGNOSTIC LAPAROSCOPY AND SIGMOIDMOIDOSCOPY ;  Surgeon: Rolm Bookbinder, MD;  Location: [facility];  Service: General;  Laterality: N/A;  . RECTAL ULTRASOUND N/A 07/13/2016    Procedure: RECTAL ULTRASOUND;  Surgeon: Leighton Ruff, MD;  Location: [facility];  Service: Endoscopy;  Laterality: N/A;  . TONSILLECTOMY      . TONSILLECTOMY AND ADENOIDECTOMY             Family History  Problem Relation Age of Onset  . Hypertension Father    . Stroke Father    . Diabetes Father    . Heart disease Mother    . Hypertension Mother    . Hyperlipidemia Mother    . Hypertension Sister    . Hypertension Brother    . Hypertension Sister    . Hypertension Brother    . Cancer Other 47        colon cancer   . Cancer Maternal Aunt          cancer, unknown type   . Cancer Maternal Uncle          bone cancer   . Cancer Paternal Aunt          lung cancer  . Cancer Paternal Uncle          lung cancer  . Cancer Maternal Uncle          colon cancer and prostate cancer   . Cancer Maternal Uncle          prostate cancer     Social History:  reports that she quit smoking about 2 years ago. Her smoking use included cigarettes. She has a 10.00 pack-year smoking history. She has never used smokeless tobacco. She reports previous alcohol use. She reports current drug use. Drug: "Crack" cocaine. Allergies:       Allergies  Allergen Reactions  . Penicillins Hives, Itching and Swelling      Tongue swells up Has patient had a PCN reaction causing immediate rash, facial/tongue/throat swelling, SOB or lightheadedness with hypotension: Yes Has patient had a PCN reaction causing severe rash involving mucus membranes or skin necrosis: Yes Has patient had a PCN reaction that required hospitalization Yes Has patient had a PCN reaction occurring within the last 10 years: No If all of the above answers are "NO", then may  proceed with Cephalosporin use.            Medications Prior to Admission  Medication Sig Dispense Refill  . albuterol (PROVENTIL) (2.5 MG/3ML) 0.083% nebulizer solution Take 3 mLs (2.5 mg total) by nebulization every 6 (six) hours as needed for wheezing or shortness of breath. 75 mL 12  . albuterol (VENTOLIN HFA) 108 (90 Base) MCG/ACT inhaler Inhale 2 puffs into the lungs every 4 (four) hours as needed for wheezing. 1 g 5  . amLODipine (NORVASC) 10 MG tablet Take 10 mg by mouth daily.    2  . aspirin EC 81 MG tablet Take 81 mg by mouth daily.      Marland Kitchen  atorvastatin (LIPITOR) 20 MG tablet TAKE 1 TABLET(20 MG) BY MOUTH DAILY (Patient taking differently: Take 20 mg by mouth daily.) 90 tablet 1  . fenofibrate (TRICOR) 145 MG tablet TAKE 1 TABLET(145 MG) BY MOUTH DAILY (Patient taking differently: 145 mg.) 90 tablet 1  . fluticasone (FLONASE) 50 MCG/ACT nasal spray Place 1-2 sprays into both nostrils daily as needed for rhinitis or allergies.      Marland Kitchen gabapentin (NEURONTIN) 600 MG tablet Take 600 mg by mouth 3 (three) times daily.      . hydrochlorothiazide (MICROZIDE) 12.5 MG capsule Take 1 capsule (12.5 mg total) by mouth daily. 30 capsule 0  . icosapent Ethyl (VASCEPA) 1 g capsule Take 2 capsules (2 g total) by mouth 2 (two) times daily. 360 capsule 0  . losartan (COZAAR) 50 MG tablet Take 1 tablet (50 mg total) by mouth daily. 30 tablet 0  . metoprolol succinate (TOPROL-XL) 50 MG 24 hr tablet TAKE 1 TABLET(50 MG) BY MOUTH DAILY WITH OR IMMEDIATELY FOLLOWING A MEAL (Patient taking differently: Take 50 mg by mouth daily.) 90 tablet 3  . Multiple Vitamin (MULTIVITAMIN ADULT PO) Take 1 tablet by mouth daily.      . pantoprazole (PROTONIX) 40 MG tablet Take 40 mg by mouth daily.      . Pirfenidone (ESBRIET) 267 MG TABS Take 3 tablets (801 mg total) by mouth in the morning, at noon, and at bedtime. 270 tablet 11  . SPIRIVA RESPIMAT 1.25 MCG/ACT AERS INHALE 2 PUFFS INTO THE LUNGS DAILY 4 g 5  . SYMBICORT  160-4.5 MCG/ACT inhaler Inhale 2 puffs into the lungs 2 (two) times daily. 1 Inhaler 5  . TOUJEO SOLOSTAR 300 UNIT/ML SOPN Inject 40-50 Units into the skin at bedtime. 50 units  in th A.M. and  40 units subcutaneous in the P.M.   1  . busPIRone (BUSPAR) 10 MG tablet Take 10 mg by mouth as needed.          Drug Regimen Review Drug regimen was reviewed and remains appropriate with no significant issues identified   Home: Home Living Family/patient expects to be discharged to:: Private residence Living Arrangements: Children Available Help at Discharge: Family,Available 24 hours/day Type of Home: Apartment Home Access: Stairs to enter Entrance Stairs-Number of Steps: 14 Entrance Stairs-Rails: Left Home Layout: One level Bathroom Shower/Tub: Tub/shower unit,Curtain Biochemist, clinical: Handicapped height Bathroom Accessibility: Yes Home Equipment: Shower seat,Cane - single point  Lives With: Son   Functional History: Prior Function Level of Independence: Independent with assistive device(s) Comments: did her own medication management. uses cane for mobility   Functional Status:  Mobility: Bed Mobility Overal bed mobility: Needs Assistance Bed Mobility: Supine to Sit,Sit to Supine Supine to sit: Mod assist,+2 for physical assistance,+2 for safety/equipment Sit to supine: Mod assist,+2 for physical assistance,+2 for safety/equipment General bed mobility comments: Patient seated in recliner upon entry. Transfers Overall transfer level: Needs assistance Equipment used: Rolling walker (2 wheeled) Transfers: Sit to/from Stand Sit to Stand: Min assist,Min guard General transfer comment: Min A from low surfaces and Min guard from elevated surfaces. Ambulation/Gait Ambulation/Gait assistance: Min assist Gait Distance (Feet): 120 Feet Assistive device: Rolling walker (2 wheeled) Gait Pattern/deviations: Step-to pattern,Step-through pattern,Decreased stance time - left,Decreased step  length - right General Gait Details: Slow, unsteady gait with left knee instability; cues for step through gait pattern and RW proximity/management. 2 seated rest breaks due to LLE fatigue. Requires standing rest breaks due to UE soreness due to heavy use of them  during gait. Gait velocity: decreased Gait velocity interpretation: <1.8 ft/sec, indicate of risk for recurrent falls   ADL: ADL Overall ADL's : Needs assistance/impaired Eating/Feeding: Set up Grooming: Minimal assistance,Sitting Upper Body Bathing: Minimal assistance,Sitting Lower Body Bathing: Maximal assistance,Sit to/from stand Upper Body Dressing : Moderate assistance,Sitting Lower Body Dressing: Maximal assistance,Sit to/from stand Toilet Transfer: Minimal assistance,RW Toileting- Clothing Manipulation and Hygiene: Minimal assistance,Sit to/from stand Functional mobility during ADLs: Minimal assistance,Rolling walker General ADL Comments: Min A for short distance functional mobility with RW. Mod cues for walker management.   Cognition: Cognition Overall Cognitive Status: Impaired/Different from baseline Arousal/Alertness: Awake/alert Orientation Level: Oriented X4 Attention: Focused Focused Attention: Appears intact Memory: Impaired Memory Impairment: Decreased short term memory,Decreased recall of new information Decreased Short Term Memory: Verbal basic Awareness: Appears intact Problem Solving: Impaired Problem Solving Impairment: Verbal basic Executive Function: Reasoning,Organizing,Decision Making Reasoning: Impaired Reasoning Impairment: Verbal basic Organizing: Impaired Organizing Impairment: Verbal basic Decision Making: Impaired Decision Making Impairment: Verbal basic Cognition Arousal/Alertness: Awake/alert Behavior During Therapy: WFL for tasks assessed/performed Overall Cognitive Status: Impaired/Different from baseline Area of Impairment: Safety/judgement,Awareness Current Attention Level:  Selective Memory: Decreased recall of precautions Following Commands: Follows one step commands with increased time Safety/Judgement: Decreased awareness of deficits,Decreased awareness of safety Awareness: Emergent Problem Solving: Slow processing General Comments: Impaired awareness of safety and deficits. "Leave that RW right by my chair so i can reach it." "I can do it by myself," when told how to safely get back to bed by calling nurse. Joking appropriately with therapists. Highly motivated.   Physical Exam: Blood pressure (!) 174/90, pulse 61, temperature 98.4 F (36.9 C), temperature source Oral, resp. rate 16, height _0  (1.549 m), weight 83.8 kg, SpO2 100 %. Physical Exam Constitutional:      Appearance: Normal appearance.     Comments: Obese; appears fatigued  HENT:     Head: Normocephalic and atraumatic.     Nose: Nose normal.     Mouth/Throat:     Mouth: Mucous membranes are moist.     Pharynx: Oropharynx is clear.  Eyes:     Extraocular Movements: Extraocular movements intact.     Pupils: Pupils are equal, round, and reactive to light.  Cardiovascular:     Rate and Rhythm: Normal rate and regular rhythm.     Pulses: Normal pulses.     Heart sounds: No murmur heard. No gallop.   Pulmonary:     Effort: Pulmonary effort is normal. No respiratory distress.     Breath sounds: No wheezing.  Abdominal:     General: Bowel sounds are normal. There is no distension.     Palpations: Abdomen is soft.     Tenderness: There is no abdominal tenderness.  Musculoskeletal:     Cervical back: Normal range of motion.  Skin:    General: Skin is warm and dry.  Neurological:     Mental Status: She is alert.     Comments: Patient is alert in no acute distress.  Makes eye contact with examiner. Fair insight and awareness. Answered biographical questions.  Oriented x3 and follows commands. Left face and left arm/hand 1/2 sensation. LLE 1+/2. RUE and RLE 2/2. Motor 4+/5 RUE and RLE. LUE  1+ prox to 2-/5 distally. LLE 2-/5 HF, KE and 1+/5 distally. DTR's 2++ LUE and LLE. DTR's 1+ RUE and RLE  Psychiatric:        Mood and Affect: Mood normal.        Behavior: Behavior normal.  Lab Results Last 48 Hours        Results for orders placed or performed during the hospital encounter of 06/25/20 (from the past 48 hour(s))  Glucose, capillary     Status: Abnormal    Collection Time: 06/28/20 11:22 AM  Result Value Ref Range    Glucose-Capillary 162 (H) 70 - 99 mg/dL      Comment: Glucose reference range applies only to samples taken after fasting for at least 8 hours.    Comment 1 Notify RN      Comment 2 Document in Chart    Glucose, capillary     Status: Abnormal    Collection Time: 06/28/20  5:02 PM  Result Value Ref Range    Glucose-Capillary 142 (H) 70 - 99 mg/dL      Comment: Glucose reference range applies only to samples taken after fasting for at least 8 hours.  Glucose, capillary     Status: Abnormal    Collection Time: 06/28/20  9:38 PM  Result Value Ref Range    Glucose-Capillary 193 (H) 70 - 99 mg/dL      Comment: Glucose reference range applies only to samples taken after fasting for at least 8 hours.  Glucose, capillary     Status: Abnormal    Collection Time: 06/29/20  6:28 AM  Result Value Ref Range    Glucose-Capillary 132 (H) 70 - 99 mg/dL      Comment: Glucose reference range applies only to samples taken after fasting for at least 8 hours.  Glucose, capillary     Status: None    Collection Time: 06/29/20  8:37 AM  Result Value Ref Range    Glucose-Capillary 84 70 - 99 mg/dL      Comment: Glucose reference range applies only to samples taken after fasting for at least 8 hours.  Glucose, capillary     Status: Abnormal    Collection Time: 06/29/20 12:14 PM  Result Value Ref Range    Glucose-Capillary 114 (H) 70 - 99 mg/dL      Comment: Glucose reference range applies only to samples taken after fasting for at least 8 hours.  Glucose, capillary      Status: Abnormal    Collection Time: 06/29/20  4:39 PM  Result Value Ref Range    Glucose-Capillary 244 (H) 70 - 99 mg/dL      Comment: Glucose reference range applies only to samples taken after fasting for at least 8 hours.  Glucose, capillary     Status: Abnormal    Collection Time: 06/29/20 11:31 PM  Result Value Ref Range    Glucose-Capillary 154 (H) 70 - 99 mg/dL      Comment: Glucose reference range applies only to samples taken after fasting for at least 8 hours.  Basic metabolic panel     Status: Abnormal    Collection Time: 06/30/20  5:13 AM  Result Value Ref Range    Sodium 140 135 - 145 mmol/L    Potassium 4.1 3.5 - 5.1 mmol/L    Chloride 111 98 - 111 mmol/L    CO2 22 22 - 32 mmol/L    Glucose, Bld 135 (H) 70 - 99 mg/dL      Comment: Glucose reference range applies only to samples taken after fasting for at least 8 hours.    BUN 30 (H) 8 - 23 mg/dL    Creatinine, Ser 1.74 (H) 0.44 - 1.00 mg/dL    Calcium 9.0 8.9 - 10.3 mg/dL  GFR, Estimated 32 (L) >60 mL/min      Comment: (NOTE) Calculated using the CKD-EPI Creatinine Equation (2021)      Anion gap 7 5 - 15      Comment: Performed at [facility], [place] [address] [place], [place] [address]  Glucose, capillary     Status: None    Collection Time: 06/30/20  6:50 AM  Result Value Ref Range    Glucose-Capillary 99 70 - 99 mg/dL      Comment: Glucose reference range applies only to samples taken after fasting for at least 8 hours.      Imaging Results (Last 48 hours)  No results found.           Medical Problem List and Plan: 1. 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 Pt felt weaker today and evaluation reflected that.             -patient may shower             -ELOS/Goals: 7-12 days, min assist to supervision with PT, OT, SLP              2.  Antithrombotics: -DVT/anticoagulation: Lovenox             -antiplatelet  therapy: Aspirin 81 mg daily and Plavix 75 mg daily x3 weeks then Plavix alone 3. Pain Management: Neurontin 300 mg 3 times daily 4. Mood: Lexapro 10 mg daily, BuSpar 10 mg daily, Vistaril 25 mg twice daily             -antipsychotic agents: N/A 5. Neuropsych: This patient is capable of making decisions on her own behalf. 6. Skin/Wound Care: Routine skin checks 7. Fluids/Electrolytes/Nutrition: Routine in and outs with follow-up chemistries 8.  Hypertension.  Norvasc 10 mg daily, Toprol-XL 50 mg daily 9.  Diabetes mellitus with peripheral neuropathy.  Hemoglobin A1c 5.2.  Lantus insulin 25 units twice daily.               -Check blood sugars before meals and at bedtime 10.  COPD/asthma Pirfenidone 801 mg 3 times daily, Ellipta inhaler 1 puff daily, Dulera 2 puffs twice daily             -Pirfenidone makes her nauseas, sometimes have diarrhea---treat supportively as she wants to stay on medication 11.  Hyperlipidemia.  Lipitor, fenofibrate,Vascepa 2 mg twice daily 12.  History of cocaine use remote tobacco abuse.  Urine drug screen negative 13.  GERD.  Protonix       Lavon Paganini Angiulli, PA-C 06/30/2020   I have personally performed a face to face diagnostic evaluation of this patient and formulated the key components of the plan.  Additionally, I have personally reviewed laboratory data, imaging studies, as well as relevant notes and concur with the physician assistant's documentation above.  The patient's status has not changed from the original H&P.  Any changes in documentation from the acute care chart have been noted above.  Meredith Staggers, MD, [facility]rown

## 2020-06-30 NOTE — Discharge Summary (Signed)
Physician Discharge Summary  Erica [name] XBM:[id number] DOB: 21-Nov-1955 DOA: 06/25/2020  PCP: Nolene Ebbs, MD  Admit date: 06/25/2020 Discharge date: 06/30/2020  Discharge disposition: Inpatient rehab center [facility])   Recommendations for Outpatient Follow-Up:   Outpatient follow-up with neurologist within 1 month of discharge. Outpatient follow-up with PCP within 1 to 2 weeks of discharge.   Discharge Diagnosis:   Principal Problem:   CVA (cerebral vascular accident) ([place]) Active Problems:   Interstitial lung disease (HCC)   COPD (chronic obstructive pulmonary disease) (HCC)   GERD (gastroesophageal reflux disease)   MDD (major depressive disorder), recurrent severe, without psychosis ([place])   Acute CVA (cerebrovascular accident) ([place])    Discharge Condition: Stable.  Diet recommendation:  Diet Order            Diet - low sodium heart healthy           Diet Carb Modified           Diet regular Room service appropriate? Yes; Fluid consistency: Thin  Diet effective now                   Code Status: Full Code     Hospital Course:   [name] is a 65 y.o. female with a past medical history significant for hypertension,hypercholesterolemia,diabetes complicated by neuropathy, coronary artery disease,depression with prior suicide attempt, arthritis, 10-pack-year history of smoking (quit in 2020), COPD/asthma, crack/cocaine use, who presented to the hospital with 1 week history of slurred speech 1 day history of left-sided weakness resulting in a fall.  She initially went to see her PCP who referred her to the emergency room for further evaluation.   Work-up in the ED was notable for multifocal strokes in the right caudate and right thalamus, thought to be cardioembolic versus aortic arch atheroembolic.  He was evaluated by the neurologist who recommended dual antiplatelet therapy with aspirin and Plavix.  Patient will continue with aspirin until 07/17/2020.   Thereafter, she will continue with Plavix monotherapy.  She has CKD stage IIIa.  She developed acute kidney injury requiring IV fluids. She was evaluated by PT and OT who recommended further rehabilitation at the inpatient rehab center.  Her condition has improved and she is deemed stable for discharge to the inpatient rehab center today.   Medical Consultants:    Neurologist   Discharge Exam:    Vitals:   06/30/20 0400 06/30/20 0806 06/30/20 0813 06/30/20 1026  BP: (!) 174/90  (!) 154/85 (!) 141/98  Pulse: 61  61 65  Resp: 16  16   Temp: 98.4 F (36.9 C)  98 F (36.7 C)   TempSrc: Oral  Oral   SpO2: 100% 98% 100%   Weight:      Height:         GEN: NAD SKIN: No rash EYES: EOMI ENT: MMM CV: RRR PULM: CTA B ABD: soft, ND, NT, +BS CNS: AAO x 3, mild left hemiparesis (power 4/5) in both upper and lower extremities EXT: No edema or tenderness   The results of significant diagnostics from this hospitalization (including imaging, microbiology, ancillary and laboratory) are listed below for reference.     Procedures and Diagnostic Studies:   CT HEAD WO CONTRAST  Result Date: 06/25/2020 CLINICAL DATA:  Neuro deficit, acute, stroke suspected Left-sided weakness, fall. EXAM: CT HEAD WITHOUT CONTRAST TECHNIQUE: Contiguous axial images were obtained from the base of the skull through the vertex without intravenous contrast. COMPARISON:  Remote head CT  10/08/2010 FINDINGS: Brain: Brain volume is normal for age. There is a lacunar infarct in the right caudate as well as the right cerebellum, new from 2012 but age indeterminate. No evidence of large or territorial ischemia. No intracranial hemorrhage, mass effect, or midline shift. No hydrocephalus. The basilar cisterns are patent. No extra-axial or intracranial fluid collection. Vascular: Atherosclerosis of skullbase vasculature without hyperdense vessel or abnormal calcification. Skull: No fracture or focal lesion. Sinuses/Orbits:  Mucosal thickening with bubbly debris and associated adjacent sclerosis of the left sphenoid sinus consistent chronic sinusitis. Remaining paranasal sinuses are clear. No mastoid effusion. Included orbits are unremarkable. Other: None. IMPRESSION: 1. Lacunar infarcts in the right caudate and right cerebellum, new from 2012, but age indeterminate. 2. No hemorrhage or evidence of large or territorial ischemia. 3. Chronic left sphenoid sinusitis. Electronically Signed   By: Keith Rake M.D.   On: 06/25/2020 17:43   MR ANGIO HEAD WO CONTRAST  Result Date: 06/25/2020 CLINICAL DATA:  Neurological deficit.  Acute stroke suspected. EXAM: MRI HEAD WITHOUT CONTRAST MRA HEAD WITHOUT CONTRAST TECHNIQUE: Multiplanar, multiecho pulse sequences of the brain and surrounding structures were obtained without intravenous contrast. Angiographic images of the head were obtained using MRA technique without contrast. COMPARISON:  CT same day FINDINGS: MRI HEAD FINDINGS Brain: Diffusion imaging shows a 1 cm acute infarction in the anterolateral thalamus on the right, possibly with some involvement of the posterior limb internal capsule. Acute infarction within the right caudate head. Scattered punctate foci of high signal on diffusion imaging within the cerebral hemispheric white matter on both sides. These white matter lesions likely represent T2 shine through within old infarctions of the hemispheric white matter, but do not show low signal on the ADC map. No large confluent infarction. Chronic small-vessel ischemic changes affect the pons. There are numerous old small vessel cerebellar infarctions. Old small vessel infarctions affect both thalami, the basal ganglia regions in the cerebral hemispheric deep and subcortical white matter. Old small cortical infarctions noted in the parieto-occipital regions and right frontal region. No hemorrhage, mass lesion, hydrocephalus or extra-axial collection. Vascular: Major vessels at the  base of the brain show flow. Skull and upper cervical spine: Negative Sinuses/Orbits: Clear/normal Other: None MRA HEAD FINDINGS Both internal carotid arteries widely patent through the skull base and siphon regions. No stenosis. The anterior and middle cerebral vessels are normal without evidence of stenosis or large or medium vessel occlusion. Both vertebral arteries are widely patent to the basilar. Proximal basilar fenestration. No basilar stenosis. Posterior circulation branch vessels are patent. Mild atherosclerotic narrowing and irregularity of the PCA branches. IMPRESSION: 1. 1 cm acute infarction in the anterolateral thalamus on the right, possibly with some involvement of the posterior limb internal capsule. Acute infarction within the right caudate head. Scattered other foci of high signal on diffusion imaging within the hemispheric white matter, not showing restricted diffusion on the ADC map, and therefore probably representing T2 shine through from old white matter infarctions. 2. Extensive old small vessel infarctions throughout the brain as outlined above. Small old cortical infarctions in the right frontal lobe and both parieto-occipital regions. 3. No large or medium vessel occlusion or correctable proximal stenosis. Mild atherosclerotic irregularity of the PCA branches. Electronically Signed   By: Nelson Chimes M.D.   On: 06/25/2020 20:56   MR Brain Wo Contrast (neuro protocol)  Result Date: 06/25/2020 CLINICAL DATA:  Neurological deficit.  Acute stroke suspected. EXAM: MRI HEAD WITHOUT CONTRAST MRA HEAD WITHOUT CONTRAST TECHNIQUE: Multiplanar, multiecho  pulse sequences of the brain and surrounding structures were obtained without intravenous contrast. Angiographic images of the head were obtained using MRA technique without contrast. COMPARISON:  CT same day FINDINGS: MRI HEAD FINDINGS Brain: Diffusion imaging shows a 1 cm acute infarction in the anterolateral thalamus on the right, possibly  with some involvement of the posterior limb internal capsule. Acute infarction within the right caudate head. Scattered punctate foci of high signal on diffusion imaging within the cerebral hemispheric white matter on both sides. These white matter lesions likely represent T2 shine through within old infarctions of the hemispheric white matter, but do not show low signal on the ADC map. No large confluent infarction. Chronic small-vessel ischemic changes affect the pons. There are numerous old small vessel cerebellar infarctions. Old small vessel infarctions affect both thalami, the basal ganglia regions in the cerebral hemispheric deep and subcortical white matter. Old small cortical infarctions noted in the parieto-occipital regions and right frontal region. No hemorrhage, mass lesion, hydrocephalus or extra-axial collection. Vascular: Major vessels at the base of the brain show flow. Skull and upper cervical spine: Negative Sinuses/Orbits: Clear/normal Other: None MRA HEAD FINDINGS Both internal carotid arteries widely patent through the skull base and siphon regions. No stenosis. The anterior and middle cerebral vessels are normal without evidence of stenosis or large or medium vessel occlusion. Both vertebral arteries are widely patent to the basilar. Proximal basilar fenestration. No basilar stenosis. Posterior circulation branch vessels are patent. Mild atherosclerotic narrowing and irregularity of the PCA branches. IMPRESSION: 1. 1 cm acute infarction in the anterolateral thalamus on the right, possibly with some involvement of the posterior limb internal capsule. Acute infarction within the right caudate head. Scattered other foci of high signal on diffusion imaging within the hemispheric white matter, not showing restricted diffusion on the ADC map, and therefore probably representing T2 shine through from old white matter infarctions. 2. Extensive old small vessel infarctions throughout the brain as  outlined above. Small old cortical infarctions in the right frontal lobe and both parieto-occipital regions. 3. No large or medium vessel occlusion or correctable proximal stenosis. Mild atherosclerotic irregularity of the PCA branches. Electronically Signed   By: Nelson Chimes M.D.   On: 06/25/2020 20:56   MR CERVICAL SPINE WO CONTRAST  Result Date: 06/26/2020 CLINICAL DATA:  Myelopathy EXAM: MRI CERVICAL SPINE WITHOUT CONTRAST TECHNIQUE: Multiplanar, multisequence MR imaging of the cervical spine was performed. No intravenous contrast was administered. COMPARISON:  None. FINDINGS: Alignment: Reversal of normal cervical lordosis may be positional or due to muscle spasm. No static subluxation. Vertebrae: No fracture, evidence of discitis, or bone lesion. 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 The spinal cord is mildly atrophic. Posterior Fossa, vertebral arteries, paraspinal tissues: Negative. Disc levels: C1-2: Unremarkable. C2-3: Normal disc space and facet joints. There is no spinal canal stenosis. No neural foraminal stenosis. C3-4: Normal disc space and facet joints. There is no spinal canal stenosis. No neural foraminal stenosis. C4-5: Small central disc protrusion indenting the ventral spinal cord. There is no spinal canal stenosis. No neural foraminal stenosis. C5-6: Intermediate sized central disc protrusion indenting the ventral spinal cord. Mild spinal canal stenosis. No neural foraminal stenosis. C6-7: Intermediate sized central disc protrusion indenting the ventral spinal cord. Moderate spinal canal stenosis. No neural foraminal stenosis. C7-T1: Small central disc extrusion with inferior migration, contacting the ventral spinal cord. Mild spinal canal stenosis. No neural foraminal stenosis. IMPRESSION:  1. 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 Mild  generalized atrophy of the upper cervical spinal cord. 2. Moderate C6-7 and mild C5-6 and C7-T1 spinal canal stenosis secondary to central disc protrusions. Electronically Signed   By: Ulyses Jarred M.D.   On: 06/26/2020 03:34   ECHOCARDIOGRAM COMPLETE  Result Date: 06/26/2020    ECHOCARDIOGRAM REPORT   Patient Name:   Erica [name] Date of Exam: 06/26/2020 Medical Rec #:  [id number]    Height:       61.0 in Accession #:    [id number]   Weight:       184.7 lb Date of Birth:  1955/06/28    BSA:          1.826 m Patient Age:    57 years     BP:           143/85 mmHg Patient Gender: F            HR:           68 bpm. Exam Location:  Inpatient Procedure: 2D Echo, Cardiac Doppler and Color Doppler Indications:    Stroke 434.91 / I163.9  History:        Patient has prior history of Echocardiogram examinations, most                 recent 07/16/2019. Risk Factors:Hypertension, Diabetes and GERD.  Sonographer:    Jonelle Sidle Dance Referring Phys: [id number] [place]  1. Left ventricular ejection fraction, by estimation, is 55 to 60%. The left ventricle has normal function. The left ventricle has no regional wall motion abnormalities. Left ventricular diastolic parameters are indeterminate.  2. Right ventricular systolic function is normal. The right ventricular size is normal.  3. The mitral valve is normal in structure. No evidence of mitral valve regurgitation.  4. The aortic valve is tricuspid. Aortic valve regurgitation is trivial. No aortic stenosis is present.  5. The inferior vena cava is normal in size with greater than 50% respiratory variability, suggesting right atrial pressure of 3 mmHg. Comparison(s): No prior Echocardiogram. There is no 07/16/19 study available in the system. FINDINGS  Left Ventricle: LV Mass Index 95 g/m2 within reference range; RWT 0.85 concentric remodeling. Left ventricular ejection fraction, by estimation, is 55 to 60%. The left ventricle has normal function. The left  ventricle has no regional wall motion abnormalities. The left ventricular internal cavity size was small. There is borderline concentric left ventricular hypertrophy. Left ventricular diastolic parameters are indeterminate. Right Ventricle: The right ventricular size is normal. No increase in right ventricular wall thickness. Right ventricular systolic function is normal. Left Atrium: Left atrial size was normal in size. Right Atrium: Right atrial size was normal in size. Pericardium: There is no evidence of pericardial effusion. Mitral Valve: The mitral valve is normal in structure. No evidence of mitral valve regurgitation. Tricuspid Valve: The tricuspid valve is normal in structure. Tricuspid valve regurgitation is trivial. Aortic Valve: The aortic valve is tricuspid. Aortic valve regurgitation is trivial. No aortic stenosis is present. Pulmonic Valve: The pulmonic valve was not well visualized. Pulmonic valve regurgitation is not visualized. Aorta: The aortic root and ascending aorta are structurally normal, with no evidence of dilitation. Venous: The inferior vena cava is normal in size with greater than 50% respiratory variability, suggesting right atrial pressure of 3 mmHg. IAS/Shunts: The atrial septum is grossly normal.  LEFT VENTRICLE PLAX 2D LVIDd:  3.30 cm  Diastology LVIDs:         2.50 cm  LV e' medial:    3.26 cm/s LV PW:         1.60 cm  LV E/e' medial:  17.6 LV IVS:        1.40 cm  LV e' lateral:   5.00 cm/s LVOT diam:     1.90 cm  LV E/e' lateral: 11.5 LV SV:         49 LV SV Index:   27 LVOT Area:     2.84 cm  RIGHT VENTRICLE             IVC RV Basal diam:  1.90 cm     IVC diam: 1.50 cm RV S prime:     16.20 cm/s TAPSE (M-mode): 2.0 cm LEFT ATRIUM             Index       RIGHT ATRIUM           Index LA diam:        3.90 cm 2.14 cm/m  RA Area:     10.10 cm LA Vol (A2C):   35.3 ml 19.33 ml/m RA Volume:   16.40 ml  8.98 ml/m LA Vol (A4C):   40.0 ml 21.90 ml/m LA Biplane Vol: 38.8 ml  21.25 ml/m  AORTIC VALVE LVOT Vmax:   78.30 cm/s LVOT Vmean:  52.900 cm/s LVOT VTI:    0.172 m  AORTA Ao Root diam: 3.30 cm Ao Asc diam:  3.50 cm MITRAL VALVE MV Area (PHT): 3.12 cm    SHUNTS MV Decel Time: 243 msec    Systemic VTI:  0.17 m MV E velocity: 57.50 cm/s  Systemic Diam: 1.90 cm MV A velocity: 89.30 cm/s MV E/A ratio:  0.64 Rudean Haskell MD Electronically signed by Rudean Haskell MD Signature Date/Time: 06/26/2020/11:56:54 AM    Final    VAS US CAROTID (at [facility] and [facility] only)  Result Date: 06/26/2020 Carotid Arterial Duplex Study Indications:       CVA. Risk Factors:      Hypertension, Diabetes. Comparison Study:  no prior Performing Technologist: Abram Sander RVS  Examination Guidelines: A complete evaluation includes B-mode imaging, spectral Doppler, color Doppler, and power Doppler as needed of all accessible portions of each vessel. Bilateral testing is considered an integral part of a complete examination. Limited examinations for reoccurring indications may be performed as noted.  Right Carotid Findings: +----------+--------+--------+--------+------------------+--------+           PSV cm/sEDV cm/sStenosisPlaque DescriptionComments +----------+--------+--------+--------+------------------+--------+ CCA Prox  62      12              heterogenous               +----------+--------+--------+--------+------------------+--------+ CCA Distal31      9               heterogenous               +----------+--------+--------+--------+------------------+--------+ ICA Prox  41      14      1-39%   heterogenous               +----------+--------+--------+--------+------------------+--------+ ICA Distal121     36                                         +----------+--------+--------+--------+------------------+--------+ ECA  40      6                                          +----------+--------+--------+--------+------------------+--------+  +----------+--------+-------+--------+-------------------+           PSV cm/sEDV cmsDescribeArm Pressure (mmHG) +----------+--------+-------+--------+-------------------+ [id number]                                         +----------+--------+-------+--------+-------------------+ +---------+--------+--+--------+-+---------+ VertebralPSV cm/s34EDV cm/s7Antegrade +---------+--------+--+--------+-+---------+  Left Carotid Findings: +----------+--------+--------+--------+------------------+--------+           PSV cm/sEDV cm/sStenosisPlaque DescriptionComments +----------+--------+--------+--------+------------------+--------+ CCA Prox  32      9               heterogenous               +----------+--------+--------+--------+------------------+--------+ CCA Distal39      10              heterogenous               +----------+--------+--------+--------+------------------+--------+ ICA Prox  48      17      1-39%   heterogenous               +----------+--------+--------+--------+------------------+--------+ ICA Distal72      22                                         +----------+--------+--------+--------+------------------+--------+ ECA       52      6                                          +----------+--------+--------+--------+------------------+--------+ +----------+--------+--------+--------+-------------------+           PSV cm/sEDV cm/sDescribeArm Pressure (mmHG) +----------+--------+--------+--------+-------------------+ [id number]                                          +----------+--------+--------+--------+-------------------+ +---------+--------+--+--------+--+---------+ VertebralPSV cm/s46EDV cm/s12Antegrade +---------+--------+--+--------+--+---------+   Summary: Right Carotid: Velocities in the right ICA are consistent with a 1-39% stenosis. Left Carotid: Velocities in the left ICA are consistent with a 1-39% stenosis.  Vertebrals: Bilateral vertebral arteries demonstrate antegrade flow. *See table(s) above for measurements and observations.  Electronically signed by Antony Contras MD on 06/26/2020 at 1:20:06 PM.    Final      Labs:   Basic Metabolic Panel: Recent Labs  Lab 06/25/20 1525 06/25/20 1700 06/25/20 1724 06/28/20 0316 06/30/20 0513  NA 137 136 138 141 140  K 4.3 5.0 5.8* 3.9 4.1  CL 105 106 109 112* 111  CO2 23 20*  --  21* 22  GLUCOSE 138* 128* 126* 153* 135*  BUN 41* 40* 63* 29* 30*  CREATININE 1.79* 1.90* 1.90* 1.54* 1.74*  CALCIUM 9.2 9.2  --  8.7* 9.0  MG  --   --   --  1.9  --    GFR Estimated Creatinine Clearance: 31.2 mL/min (A) (by C-G formula based on SCr of 1.74 mg/dL (H)). Liver Function Tests: Recent Labs  Lab 06/25/20 1525 06/25/20 1700  AST 20 63*  ALT 12 20  ALKPHOS  --  65  BILITOT 1.1 0.4  PROT 8.3* 7.9  ALBUMIN  --  3.7   No results for input(s): LIPASE, AMYLASE in the last 168 hours. No results for input(s): AMMONIA in the last 168 hours. Coagulation profile Recent Labs  Lab 06/25/20 1700  INR 1.2    CBC: Recent Labs  Lab 06/25/20 1525 06/25/20 1700 06/25/20 1724 06/28/20 0316  WBC 6.2 5.9  --  3.8*  NEUTROABS  --  4.4  --   --   HGB 11.3* 11.1* 10.9* 10.2*  HCT 32.0* 31.3* 32.0* 28.5*  MCV 91.4 93.4  --  90.8  PLT 212 169  --  171   Cardiac Enzymes: No results for input(s): CKTOTAL, CKMB, CKMBINDEX, TROPONINI in the last 168 hours. BNP: Invalid input(s): POCBNP CBG: Recent Labs  Lab 06/29/20 0837 06/29/20 1214 06/29/20 1639 06/29/20 2331 06/30/20 0650  GLUCAP 84 114* 244* 154* 99   D-Dimer No results for input(s): DDIMER in the last 72 hours. Hgb A1c No results for input(s): HGBA1C in the last 72 hours. Lipid Profile No results for input(s): CHOL, HDL, LDLCALC, TRIG, CHOLHDL, LDLDIRECT in the last 72 hours. Thyroid function studies No results for input(s): TSH, T4TOTAL, T3FREE, THYROIDAB in the last 72 hours.  Invalid  input(s): FREET3 Anemia work up No results for input(s): VITAMINB12, FOLATE, FERRITIN, TIBC, IRON, RETICCTPCT in the last 72 hours. Microbiology Recent Results (from the past 240 hour(s))  SARS CORONAVIRUS 2 (TAT 6-24 HRS) Nasopharyngeal Nasopharyngeal Swab     Status: None   Collection Time: 06/25/20  6:21 PM   Specimen: Nasopharyngeal Swab  Result Value Ref Range Status   SARS Coronavirus 2 NEGATIVE NEGATIVE Final    Comment: (NOTE) SARS-CoV-2 target nucleic acids are NOT DETECTED.  The SARS-CoV-2 RNA is generally detectable in upper and lower respiratory specimens during the acute phase of infection. Negative results do not preclude SARS-CoV-2 infection, do not rule out co-infections with other pathogens, and should not be used as the sole basis for treatment or other patient management decisions. Negative results must be combined with clinical observations, patient history, and epidemiological information. The expected result is Negative.  Fact Sheet for Patients: SugarRoll.be  Fact Sheet for Healthcare Providers: https://www.woods-mathews.com/  This test is not yet approved or cleared by the [place] FDA and  has been authorized for detection and/or diagnosis of SARS-CoV-2 by FDA under an Emergency Use Authorization (EUA). This EUA will remain  in effect (meaning this test can be used) for the duration of the COVID-19 declaration under Se ction 564(b)(1) of the Act, 21 U.S.C. section 360bbb-3(b)(1), unless the authorization is terminated or revoked sooner.  Performed at [facility], [place] [address] [place], [place] [address]      Discharge Instructions:   Discharge Instructions    Ambulatory referral to Neurology   Complete by: As directed    Follow up with stroke clinic NP (Jessica Vanschaick or Cecille Rubin, if both not available, consider Zachery Dauer, or [place]) at [facility] in about 4 weeks. Thanks.   Diet -  low sodium heart healthy   Complete by: As directed    Diet Carb Modified   Complete by: As directed    Increase activity slowly   Complete by: As directed      Allergies as of 06/30/2020      Reactions   Penicillins Hives, Itching, Swelling  Tongue swells up Has patient had a PCN reaction causing immediate rash, facial/tongue/throat swelling, SOB or lightheadedness with hypotension: Yes Has patient had a PCN reaction causing severe rash involving mucus membranes or skin necrosis: Yes Has patient had a PCN reaction that required hospitalization Yes Has patient had a PCN reaction occurring within the last 10 years: No If all of the above answers are "NO", then may proceed with Cephalosporin use.      Medication List    TAKE these medications   albuterol (2.5 MG/3ML) 0.083% nebulizer solution Commonly known as: PROVENTIL Take 3 mLs (2.5 mg total) by nebulization every 6 (six) hours as needed for wheezing or shortness of breath.   albuterol 108 (90 Base) MCG/ACT inhaler Commonly known as: VENTOLIN HFA Inhale 2 puffs into the lungs every 4 (four) hours as needed for wheezing.   amLODipine 10 MG tablet Commonly known as: NORVASC Take 10 mg by mouth daily.   aspirin EC 81 MG tablet Take 81 mg by mouth daily.   atorvastatin 40 MG tablet Commonly known as: LIPITOR Take 1 tablet (40 mg total) by mouth daily. What changed:   medication strength  See the new instructions.   busPIRone 10 MG tablet Commonly known as: BUSPAR Take 10 mg by mouth as needed.   clopidogrel 75 MG tablet Commonly known as: PLAVIX Take 1 tablet (75 mg total) by mouth daily for 16 days. Start taking on: July 01, 2020   Esbriet 267 MG Tabs Generic drug: Pirfenidone Take 3 tablets (801 mg total) by mouth in the morning, at noon, and at bedtime.   escitalopram 10 MG tablet Commonly known as: LEXAPRO Take 1 tablet (10 mg total) by mouth daily. Start taking on: July 01, 2020   fenofibrate  145 MG tablet Commonly known as: TRICOR TAKE 1 TABLET(145 MG) BY MOUTH DAILY What changed: See the new instructions.   fluticasone 50 MCG/ACT nasal spray Commonly known as: FLONASE Place 1-2 sprays into both nostrils daily as needed for rhinitis or allergies.   gabapentin 600 MG tablet Commonly known as: NEURONTIN Take 0.5 tablets (300 mg total) by mouth 2 (two) times daily. What changed:   how much to take  when to take this   hydrochlorothiazide 12.5 MG capsule Commonly known as: MICROZIDE Take 1 capsule (12.5 mg total) by mouth daily.   hydrOXYzine 25 MG tablet Commonly known as: ATARAX/VISTARIL Take 1 tablet (25 mg total) by mouth 2 (two) times daily.   icosapent Ethyl 1 g capsule Commonly known as: Vascepa Take 2 capsules (2 g total) by mouth 2 (two) times daily.   losartan 50 MG tablet Commonly known as: COZAAR Take 1 tablet (50 mg total) by mouth daily.   metoprolol succinate 50 MG 24 hr tablet Commonly known as: TOPROL-XL TAKE 1 TABLET(50 MG) BY MOUTH DAILY WITH OR IMMEDIATELY FOLLOWING A MEAL What changed: See the new instructions.   MULTIVITAMIN ADULT PO Take 1 tablet by mouth daily.   pantoprazole 40 MG tablet Commonly known as: PROTONIX Take 40 mg by mouth daily.   Spiriva Respimat 1.25 MCG/ACT Aers Generic drug: Tiotropium Bromide Monohydrate INHALE 2 PUFFS INTO THE LUNGS DAILY   Symbicort 160-4.5 MCG/ACT inhaler Generic drug: budesonide-formoterol Inhale 2 puffs into the lungs 2 (two) times daily.   Toujeo SoloStar 300 UNIT/ML Solostar Pen Generic drug: insulin glargine (1 Unit Dial) Inject 25 Units into the skin 2 (two) times daily. 50 units  in th A.M. and  40 units subcutaneous in the  P.M. What changed:   how much to take  when to take this       Follow-up Information    [facility]. Schedule an appointment as soon as possible for a visit in 4 week(s).   Specialty: Neurology Contact information: [place] [phone]               Time coordinating discharge: 32 minutes  Signed:  Jennye Boroughs  [facility] 06/30/2020, 10:54 AM   Pager on [email or web address]. If 7PM-7AM, please contact night-coverage at [email or web address]

## 2020-06-30 NOTE — Progress Notes (Signed)
Inpatient Rehabilitation Medication Review by a Pharmacist  A complete drug regimen review was completed for this patient to identify any potential clinically significant medication issues.  Clinically significant medication issues were identified:  Yes  Type of Medication Issue Identified Description of Issue Urgent (address now) Non-Urgent (address on AM team rounds) Plan Plan Accepted by Provider? (Yes / No / Pending AM Rounds)  Drug Interaction(s) (clinically significant)       Duplicate Therapy       Allergy       No Medication Administration End Date       Incorrect Dose       Additional Drug Therapy Needed       Other  The following medications were listed on pt's discharge summary from [facility] (these meds were listed on pt's PTA med list, but pt did not receive them at [facility]): Hydrochlorothiazide, losartan (ordered at [facility], but d/c'd before any doses given) Multivitamin, Flonase PRN (not ordered at [facility]) Non urgent Review by CIR team on AM rounds Pending AM rounds    Name of provider notified for urgent issues identified:  N/A  For non-urgent medication issues to be resolved on team rounds tomorrow morning a [facility] Secure Chat Handoff was sent to:  Marlowe Sh[place], PA; Reesa Chew, PA  Time spent performing this drug regimen review (minutes):  [place], PharmD, BCPS, [facility] Clinical Pharmacist 06/30/2020 2:36 PM

## 2020-06-30 NOTE — Progress Notes (Signed)
Patient ID: Erica [name], female   DOB: [birth year], 65 y.o.   MRN: [id number] Admit to unit, reviewed rehabilitation therapy schedule, medications and plan of care Margarito Liner

## 2020-06-30 NOTE — Progress Notes (Signed)
Signed      Expand All Collapse All           Physical Medicine and Rehabilitation Consult Reason for Consult: Left-sided weakness with slurred speech Referring Physician: Internal medicine   HPI: Erica [name] is a 65 y.o. right-handed female with history of hypertension hyperlipidemia diabetes mellitus COPD cocaine use as well as rectal cancer.  Per chart review patient lives with her children.  1 level home 4 steps to entry.  Independent with assistive device.  Her sister checks on her routinely.  Presented 06/25/2020 with left-sided weakness/slurred speech as well as fall.  Patient had reported recent episodes of questionable syncope.  CT/MRI showed a 1 cm acute infarction in the anterior lateral thalamus on the right with some involvement of the posterior limb internal capsule.  Acute infarction within the right caudate head.  Scattered other foci of high signal diffusion imaging within the hemispheric white matter.  Extensive old small vessel infarctions throughout the brain.  Patient did not receive tPA.  MRA was unremarkable.  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  Moderate C6-7 and mild C5-6 and C7-T1 spinal canal stenosis secondary to central disc protrusions.  Admission chemistries unremarkable except glucose 128, creatinine 1.90, SARS coronavirus negative, urine drug screen negative.  Carotid Dopplers with no ICA stenosis.  Echocardiogram with ejection fraction of 55 to 60% no wall motion abnormalities.  Maintain on aspirin and Plavix for CVA prophylaxis x3 weeks then Plavix alone.  Maintain on a regular diet.  Therapy evaluations completed recommendations of physical medicine rehab consult secondary left-sided weakness and slurred speech as well as decreased functional mobility.  Occupational Therapy in room.  Ambulated from chair to toilet with min assist x1. Review of Systems  Constitutional: Negative  for chills and fever.  HENT: Negative for hearing loss.   Eyes: Negative for blurred vision and double vision.  Respiratory: Negative for cough and shortness of breath.   Cardiovascular: Negative for chest pain, palpitations and leg swelling.  Gastrointestinal: Positive for constipation. Negative for heartburn, nausea and vomiting.       GERD  Genitourinary: Negative for dysuria, flank pain and hematuria.  Musculoskeletal: Positive for joint pain and myalgias.  Skin: Negative for rash.  Neurological: Positive for speech change, weakness and headaches.  Psychiatric/Behavioral: Positive for depression. The patient has insomnia.   All other systems reviewed and are negative.      Past Medical History:  Diagnosis Date  . Arthritis    especially in shoulders  . Asthma   . Depression   . Diabetes mellitus   . GERD (gastroesophageal reflux disease)   . Headache(784.0)    "mild"  . Hypertension   . Mental disorder    depression  . Neuropathy    feet   . Pain    arthritis pain - takes tramadol as needed  . Rectal polyp    very little bleeding with bowel movements- no pain        Past Surgical History:  Procedure Laterality Date  . ANAL RECTAL MANOMETRY N/A 07/13/2016   Procedure: ANO RECTAL MANOMETRY;  Surgeon: Leighton Ruff, MD;  Location: [facility];  Service: Endoscopy;  Laterality: N/A;  . CESAREAN SECTION    . EUS N/A 11/21/2012   Procedure: LOWER ENDOSCOPIC ULTRASOUND (EUS);  Surgeon: Arta Silence, MD;  Location: [facility];  Service: Endoscopy;  Laterality: N/A;  . FLEXIBLE SIGMOIDOSCOPY N/A 11/21/2012   Procedure: FLEXIBLE SIGMOIDOSCOPY;  Surgeon: Arta Silence, MD;  Location: [facility];  Service: Endoscopy;  Laterality: N/A;  . FLEXIBLE SIGMOIDOSCOPY N/A 01/28/2013   Procedure: FLEXIBLE SIGMOIDOSCOPY;  Surgeon: Leighton Ruff, MD;  Location: [facility];  Service: Endoscopy;  Laterality: N/A;  . LAPAROSCOPIC LOW ANTERIOR RESECTION N/A  01/29/2013   Procedure: LAPAROSCOPIC LOW ANTERIOR RESECTION, Rigid Proctoscopy;  Surgeon: Leighton Ruff, MD;  Location: [facility]S;  Service: General;  Laterality: N/A;  . LAPAROSCOPIC SIGMOID COLECTOMY N/A 11/14/2012   Procedure: DIAGNOSTIC LAPAROSCOPY AND SIGMOIDMOIDOSCOPY ;  Surgeon: Rolm Bookbinder, MD;  Location: [facility];  Service: General;  Laterality: N/A;  . RECTAL ULTRASOUND N/A 07/13/2016   Procedure: RECTAL ULTRASOUND;  Surgeon: Leighton Ruff, MD;  Location: [facility];  Service: Endoscopy;  Laterality: N/A;  . TONSILLECTOMY    . TONSILLECTOMY AND ADENOIDECTOMY          Family History  Problem Relation Age of Onset  . Hypertension Father   . Stroke Father   . Diabetes Father   . Heart disease Mother   . Hypertension Mother   . Hyperlipidemia Mother   . Hypertension Sister   . Hypertension Brother   . Hypertension Sister   . Hypertension Brother   . Cancer Other 47       colon cancer   . Cancer Maternal Aunt        cancer, unknown type   . Cancer Maternal Uncle        bone cancer   . Cancer Paternal Aunt        lung cancer  . Cancer Paternal Uncle        lung cancer  . Cancer Maternal Uncle        colon cancer and prostate cancer   . Cancer Maternal Uncle        prostate cancer    Social History:  reports that she quit smoking about 2 years ago. Her smoking use included cigarettes. She has a 10.00 pack-year smoking history. She has never used smokeless tobacco. She reports previous alcohol use. She reports current drug use. Drug: "Crack" cocaine. Allergies:       Allergies  Allergen Reactions  . Penicillins Hives, Itching and Swelling    Tongue swells up Has patient had a PCN reaction causing immediate rash, facial/tongue/throat swelling, SOB or lightheadedness with hypotension: Yes Has patient had a PCN reaction causing severe rash involving mucus membranes or skin necrosis: Yes Has patient had a PCN reaction that required  hospitalization Yes Has patient had a PCN reaction occurring within the last 10 years: No If all of the above answers are "NO", then may proceed with Cephalosporin use.          Medications Prior to Admission  Medication Sig Dispense Refill  . albuterol (PROVENTIL) (2.5 MG/3ML) 0.083% nebulizer solution Take 3 mLs (2.5 mg total) by nebulization every 6 (six) hours as needed for wheezing or shortness of breath. 75 mL 12  . albuterol (VENTOLIN HFA) 108 (90 Base) MCG/ACT inhaler Inhale 2 puffs into the lungs every 4 (four) hours as needed for wheezing. 1 g 5  . amLODipine (NORVASC) 10 MG tablet Take 10 mg by mouth daily.   2  . aspirin EC 81 MG tablet Take 81 mg by mouth daily.    Marland Kitchen atorvastatin (LIPITOR) 20 MG tablet TAKE 1 TABLET(20 MG) BY MOUTH DAILY (Patient taking differently: Take 20 mg by mouth daily.) 90 tablet 1  . fenofibrate (TRICOR) 145 MG tablet TAKE 1 TABLET(145 MG) BY MOUTH  DAILY (Patient taking differently: 145 mg.) 90 tablet 1  . fluticasone (FLONASE) 50 MCG/ACT nasal spray Place 1-2 sprays into both nostrils daily as needed for rhinitis or allergies.    Marland Kitchen gabapentin (NEURONTIN) 600 MG tablet Take 600 mg by mouth 3 (three) times daily.    . hydrochlorothiazide (MICROZIDE) 12.5 MG capsule Take 1 capsule (12.5 mg total) by mouth daily. 30 capsule 0  . icosapent Ethyl (VASCEPA) 1 g capsule Take 2 capsules (2 g total) by mouth 2 (two) times daily. 360 capsule 0  . losartan (COZAAR) 50 MG tablet Take 1 tablet (50 mg total) by mouth daily. 30 tablet 0  . metoprolol succinate (TOPROL-XL) 50 MG 24 hr tablet TAKE 1 TABLET(50 MG) BY MOUTH DAILY WITH OR IMMEDIATELY FOLLOWING A MEAL (Patient taking differently: Take 50 mg by mouth daily.) 90 tablet 3  . Multiple Vitamin (MULTIVITAMIN ADULT PO) Take 1 tablet by mouth daily.    . pantoprazole (PROTONIX) 40 MG tablet Take 40 mg by mouth daily.    . Pirfenidone (ESBRIET) 267 MG TABS Take 3 tablets (801 mg total) by mouth in the  morning, at noon, and at bedtime. 270 tablet 11  . SPIRIVA RESPIMAT 1.25 MCG/ACT AERS INHALE 2 PUFFS INTO THE LUNGS DAILY 4 g 5  . SYMBICORT 160-4.5 MCG/ACT inhaler Inhale 2 puffs into the lungs 2 (two) times daily. 1 Inhaler 5  . TOUJEO SOLOSTAR 300 UNIT/ML SOPN Inject 40-50 Units into the skin at bedtime. 50 units  in th A.M. and  40 units subcutaneous in the P.M.  1  . busPIRone (BUSPAR) 10 MG tablet Take 10 mg by mouth as needed.      Home: Home Living Family/patient expects to be discharged to:: Private residence Living Arrangements: Children Available Help at Discharge: Family,Available 24 hours/day Type of Home: Apartment Home Access: Stairs to enter Entrance Stairs-Number of Steps: 14 Entrance Stairs-Rails: Left Home Layout: One level Bathroom Shower/Tub: Tub/shower unit,Curtain Biochemist, clinical: Handicapped height Bathroom Accessibility: Yes Home Equipment: Shower seat,Cane - single point  Lives With: Son  Functional History: Prior Function Level of Independence: Independent with assistive device(s) Comments: did her own medication management. uses cane for mobility Functional Status:  Mobility: Bed Mobility Overal bed mobility: Needs Assistance Bed Mobility: Supine to Sit,Sit to Supine Supine to sit: Mod assist,+2 for physical assistance,+2 for safety/equipment Sit to supine: Mod assist,+2 for physical assistance,+2 for safety/equipment General bed mobility comments: Required assist for trunk and LEs. Increased time on stretcher Transfers Overall transfer level: Needs assistance Equipment used: Rolling walker (2 wheeled),2 person hand held assist Transfers: Sit to/from Stand Sit to Stand: Max assist,+2 physical assistance General transfer comment: Assist for lift and steadying to stand and EOB. Used RW, however, pt with increased knee buckling, so transitioned to 2 person HHA. Required manual blocking of LLE. difficulty taking steps at EOB.  ADL: ADL Overall  ADL's : Needs assistance/impaired Eating/Feeding: Set up Grooming: Minimal assistance,Sitting Upper Body Bathing: Minimal assistance,Sitting Lower Body Bathing: Maximal assistance,Sit to/from stand Upper Body Dressing : Moderate assistance,Sitting Lower Body Dressing: Maximal assistance,Sit to/from stand Toilet Transfer: Maximal assistance,+2 for physical assistance (simulated) Toileting- Clothing Manipulation and Hygiene: Maximal assistance Functional mobility during ADLs: Maximal assistance,+2 for physical assistance General ADL Comments: fear of falling  Cognition: Cognition Overall Cognitive Status: Impaired/Different from baseline Arousal/Alertness: Awake/alert Orientation Level: Oriented X4 Attention: Focused Focused Attention: Appears intact Memory: Impaired Memory Impairment: Decreased short term memory,Decreased recall of new information Decreased Short Term Memory: Verbal basic Awareness:  Appears intact Problem Solving: Impaired Problem Solving Impairment: Verbal basic Executive Function: Reasoning,Organizing,Decision Making Reasoning: Impaired Reasoning Impairment: Verbal basic Organizing: Impaired Organizing Impairment: Verbal basic Decision Making: Impaired Decision Making Impairment: Verbal basic Cognition Arousal/Alertness: Awake/alert Behavior During Therapy: WFL for tasks assessed/performed Overall Cognitive Status: Impaired/Different from baseline Area of Impairment: Attention,Awareness,Safety/judgement,Problem solving,Following commands,Memory Current Attention Level: Selective Memory: Decreased recall of precautions,Decreased short-term memory Following Commands: Follows one step commands with increased time Safety/Judgement: Decreased awareness of deficits,Decreased awareness of safety Awareness: Emergent Problem Solving: Slow processing General Comments: Impaired awareness of safety and deficits. Difficulty following instructions to perform visual  field tracking. Slow processing  Blood pressure 139/67, pulse 66, temperature 97.6 F (36.4 C), temperature source Oral, resp. rate 16, height _0  (1.549 m), weight 83.8 kg, SpO2 100 %. Physical Exam Neurological:     Comments: Patient is alert in no acute distress.  Makes eye contact with examiner.  Follows simple commands.  Oriented to person and place.    General: No acute distress Mood and affect are appropriate Heart: Regular rate and rhythm no rubs murmurs or extra sounds Lungs: Clear to auscultation, breathing unlabored, no rales or wheezes Abdomen: Positive bowel sounds, soft nontender to palpation, nondistended Extremities: No clubbing, cyanosis, or edema Skin: No evidence of breakdown, no evidence of rash Neurologic: Cranial nerves II through XII intact, motor strength is 5/5 in right and 4/5 left deltoid, bicep, tricep, grip, hip flexor, knee extensors, ankle dorsiflexor and plantar flexor Sensory exam normal sensation to light touch bilateral upper and lower extremities, patient feels the left side is cold and tingly compared to the right side Cerebellar exam normal finger to nose to finger as well as heel to shin in bilateral upper and lower extremities Musculoskeletal: Full range of motion in all 4 extremities. No joint swelling   Lab Results Last 24 Hours       Results for orders placed or performed during the hospital encounter of 06/25/20 (from the past 24 hour(s))  Glucose, capillary     Status: Abnormal   Collection Time: 06/27/20  9:07 PM  Result Value Ref Range   Glucose-Capillary 155 (H) 70 - 99 mg/dL  Basic metabolic panel     Status: Abnormal   Collection Time: 06/28/20  3:16 AM  Result Value Ref Range   Sodium 141 135 - 145 mmol/L   Potassium 3.9 3.5 - 5.1 mmol/L   Chloride 112 (H) 98 - 111 mmol/L   CO2 21 (L) 22 - 32 mmol/L   Glucose, Bld 153 (H) 70 - 99 mg/dL   BUN 29 (H) 8 - 23 mg/dL   Creatinine, Ser 1.54 (H) 0.44 - 1.00 mg/dL    Calcium 8.7 (L) 8.9 - 10.3 mg/dL   GFR, Estimated 37 (L) >60 mL/min   Anion gap 8 5 - 15  Magnesium     Status: None   Collection Time: 06/28/20  3:16 AM  Result Value Ref Range   Magnesium 1.9 1.7 - 2.4 mg/dL  CBC     Status: Abnormal   Collection Time: 06/28/20  3:16 AM  Result Value Ref Range   WBC 3.8 (L) 4.0 - 10.5 K/uL   RBC 3.14 (L) 3.87 - 5.11 MIL/uL   Hemoglobin 10.2 (L) 12.0 - 15.0 g/dL   HCT 28.5 (L) 36.0 - 46.0 %   MCV 90.8 80.0 - 100.0 fL   MCH 32.5 26.0 - 34.0 pg   MCHC 35.8 30.0 - 36.0 g/dL   RDW 17.2 (H)  11.5 - 15.5 %   Platelets 171 150 - 400 K/uL   nRBC 0.0 0.0 - 0.2 %  Glucose, capillary     Status: None   Collection Time: 06/28/20  6:16 AM  Result Value Ref Range   Glucose-Capillary 84 70 - 99 mg/dL  Glucose, capillary     Status: Abnormal   Collection Time: 06/28/20 11:22 AM  Result Value Ref Range   Glucose-Capillary 162 (H) 70 - 99 mg/dL   Comment 1 Notify RN    Comment 2 Document in Chart   Glucose, capillary     Status: Abnormal   Collection Time: 06/28/20  5:02 PM  Result Value Ref Range   Glucose-Capillary 142 (H) 70 - 99 mg/dL     Imaging Results (Last 48 hours)  No results found.     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 Does the need for close, 24 hr/day medical supervision in concert with the patient's rehab needs make it unreasonable for this patient to be served in a less intensive setting? Yes 2. Co-Morbidities requiring supervision/potential complications: COPD, history of rectal CA, hypertension, hyperlipidemia, history of cocaine abuse 3. Due to bladder management, bowel management, safety, skin/wound care, disease management, medication administration, pain management and patient education, does the patient require 24 hr/day rehab nursing? Yes 4. Does the patient require coordinated care of a physician, rehab nurse, therapy  disciplines of PT, OT to address physical and functional deficits in the context of the above medical diagnosis(es)? Yes Addressing deficits in the following areas: balance, endurance, locomotion, strength, transferring, bowel/bladder control, bathing, dressing, toileting and psychosocial support 5. Can the patient actively participate in an intensive therapy program of at least 3 hrs of therapy per day at least 5 days per week? Yes 6. The potential for patient to make measurable gains while on inpatient rehab is excellent 7. Anticipated functional outcomes upon discharge from inpatient rehab are modified independent and supervision  with PT, modified independent and supervision with OT, n/a with SLP. 8. Estimated rehab length of stay to reach the above functional goals is: 7 to 10 days 9. Anticipated discharge destination: Home 10. Overall Rehab/Functional Prognosis: excellent  RECOMMENDATIONS: This patient's condition is appropriate for continued rehabilitative care in the following setting: CIR Patient has agreed to participate in recommended program. Yes Note that insurance prior authorization may be required for reimbursement for recommended care.  Comment:   Cathlyn Parsons, PA-C 06/28/2020  "I have personally performed a face to face diagnostic evaluation of this patient.  Additionally, I have reviewed and concur with the physician assistant's documentation above." Charlett Blake M.D. [place] FAAPM&R Diplomate Am Board of Electrodiagnostic Med Fellow Am Board of Interventional Pain

## 2020-07-01 ENCOUNTER — Inpatient Hospital Stay (HOSPITAL_COMMUNITY): Payer: Medicare HMO

## 2020-07-01 ENCOUNTER — Inpatient Hospital Stay (HOSPITAL_COMMUNITY): Payer: Medicare HMO | Admitting: Physical Therapy

## 2020-07-01 ENCOUNTER — Inpatient Hospital Stay (HOSPITAL_COMMUNITY): Payer: Medicare HMO | Admitting: Occupational Therapy

## 2020-07-01 DIAGNOSIS — I10 Essential (primary) hypertension: Secondary | ICD-10-CM

## 2020-07-01 DIAGNOSIS — E1142 Type 2 diabetes mellitus with diabetic polyneuropathy: Secondary | ICD-10-CM

## 2020-07-01 DIAGNOSIS — I69993 Ataxia following unspecified cerebrovascular disease: Secondary | ICD-10-CM

## 2020-07-01 DIAGNOSIS — E1159 Type 2 diabetes mellitus with other circulatory complications: Secondary | ICD-10-CM

## 2020-07-01 DIAGNOSIS — N179 Acute kidney failure, unspecified: Secondary | ICD-10-CM

## 2020-07-01 LAB — CBC WITH DIFFERENTIAL/PLATELET
Abs Immature Granulocytes: 0.02 10*3/uL (ref 0.00–0.07)
Basophils Absolute: 0 10*3/uL (ref 0.0–0.1)
Basophils Relative: 0 %
Eosinophils Absolute: 0.1 10*3/uL (ref 0.0–0.5)
Eosinophils Relative: 3 %
HCT: 31.6 % — ABNORMAL LOW (ref 36.0–46.0)
Hemoglobin: 11 g/dL — ABNORMAL LOW (ref 12.0–15.0)
Immature Granulocytes: 1 %
Lymphocytes Relative: 30 %
Lymphs Abs: 1.1 10*3/uL (ref 0.7–4.0)
MCH: 32.2 pg (ref 26.0–34.0)
MCHC: 34.8 g/dL (ref 30.0–36.0)
MCV: 92.4 fL (ref 80.0–100.0)
Monocytes Absolute: 0.3 10*3/uL (ref 0.1–1.0)
Monocytes Relative: 7 %
Neutro Abs: 2.3 10*3/uL (ref 1.7–7.7)
Neutrophils Relative %: 59 %
Platelets: 194 10*3/uL (ref 150–400)
RBC: 3.42 MIL/uL — ABNORMAL LOW (ref 3.87–5.11)
RDW: 16.9 % — ABNORMAL HIGH (ref 11.5–15.5)
WBC: 3.8 10*3/uL — ABNORMAL LOW (ref 4.0–10.5)
nRBC: 0 % (ref 0.0–0.2)

## 2020-07-01 LAB — COMPREHENSIVE METABOLIC PANEL
ALT: 21 U/L (ref 0–44)
AST: 30 U/L (ref 15–41)
Albumin: 3.4 g/dL — ABNORMAL LOW (ref 3.5–5.0)
Alkaline Phosphatase: 59 U/L (ref 38–126)
Anion gap: 7 (ref 5–15)
BUN: 27 mg/dL — ABNORMAL HIGH (ref 8–23)
CO2: 23 mmol/L (ref 22–32)
Calcium: 9 mg/dL (ref 8.9–10.3)
Chloride: 111 mmol/L (ref 98–111)
Creatinine, Ser: 1.91 mg/dL — ABNORMAL HIGH (ref 0.44–1.00)
GFR, Estimated: 29 mL/min — ABNORMAL LOW (ref >60)
Glucose, Bld: 75 mg/dL (ref 70–99)
Potassium: 3.8 mmol/L (ref 3.5–5.1)
Sodium: 141 mmol/L (ref 135–145)
Total Bilirubin: 0.7 mg/dL (ref 0.3–1.2)
Total Protein: 7.2 g/dL (ref 6.5–8.1)

## 2020-07-01 LAB — GLUCOSE, CAPILLARY
Glucose-Capillary: 80 mg/dL (ref 70–99)
Glucose-Capillary: 176 mg/dL — ABNORMAL HIGH (ref 70–99)
Glucose-Capillary: 221 mg/dL — ABNORMAL HIGH (ref 70–99)
Glucose-Capillary: 273 mg/dL — ABNORMAL HIGH (ref 70–99)

## 2020-07-01 NOTE — Patient Care Conference (Signed)
Inpatient RehabilitationTeam Conference and Plan of Care Update Date: 07/01/2020   Time: 11:15 AM    Patient Name: Erica [name]      Medical Record Number: [id number]  Date of Birth: Sep 01, 1955 Sex: Female         Room/Bed: 4W20C/4W20C-01 Payor Info: Payor: [employer] MEDICARE / Plan: [facility] [employer] / Product Type: *No Product type* /    Admit Date/Time:  06/30/2020  1:51 PM  Primary Diagnosis:  <principal problem not specified>  Hospital Problems: Active Problems:   Right thalamic infarction [facility])   Diabetic peripheral neuropathy ([place])   Essential hypertension   AKI (acute kidney injury) ([place])   Ataxia, late effect of cerebrovascular disease    Expected Discharge Date: Expected Discharge Date: 07/11/20 [facility] pending)  Team Members Present: Physician leading conference: Dr. Delice Lesch Care Coodinator Present: Dorien Chihuahua, RN, BSN, CRRN;Becky Dupree, LCSW Nurse Present: Benjie Karvonen, RN PT Present: Ginnie Smart, PT OT Present: Leretha Pol, OT SLP Present: Nadara Mode, SLP PPS Coordinator present : Gunnar Fusi, SLP     Current Status/Progress Goal Weekly Team Focus  Bowel/Bladder   Continent of B/B LBM 11/17  remain continent with normal bowel pattern  toilet prn   Swallow/Nutrition/ Hydration             ADL's             Mobility             Communication             Safety/Cognition/ Behavioral Observations            Pain   c/o HA relieved with tylenol  pt will be free of pain  assess pain qshift and prn notify MD for pain not relieved   Skin   skin intact  skin will remain intact and free of breakdown or infection  assess skin qshift and prn     Discharge Planning:  have not met yet-new patient being evaluted today. Supposedly plan is home with son and family to provide 24/7 supervision?   Team Discussion: Post CVA with mild cognitive impairments noted and numbness left side of face and left hand. Continent of bowel and bladder. Left knee  buckles with activity.  Patient on target to meet rehab goals: yes, min assist for transfers and able to ambulate with a rolling walker  *See Care Plan and progress notes for long and short-term goals.   Revisions to Treatment Plan:   Teaching Needs: Transfers, toileting, secondary stroke risk management, medications, etc.  Current Barriers to Discharge: Decreased caregiver support and Home enviroment access/layout 14 steps to apartment  Possible Resolutions to Barriers: Family education Move to another apartment at discharge (planned moving PTA in the future)    Medical Summary Current Status: Left hemiparesis secondary to right thalamic and posterior limb of internal capsule infarct d/t small vessel disease.  Barriers to Discharge: Medical stability;Home enviroment access/layout;Decreased family/caregiver support   Possible Resolutions to [employer] Focus: Therapies, optimize DM/HTN meds, counsel regarding substance abuse   Continued Need for Acute Rehabilitation Level of Care: The patient requires daily medical management by a physician with specialized training in physical medicine and rehabilitation for the following reasons: Direction of a multidisciplinary physical rehabilitation program to maximize functional independence : Yes Medical management of patient stability for increased activity during participation in an intensive rehabilitation regime.: Yes Analysis of laboratory values and/or radiology reports with any subsequent need for medication adjustment and/or medical intervention. : Yes  I attest that I was present, lead the team conference, and concur with the assessment and plan of the team.   Margarito Liner 07/01/2020, 3:54 PM

## 2020-07-01 NOTE — Progress Notes (Signed)
Inpatient Rehabilitation  Patient information reviewed and entered into eRehab system by Mychael Soots M. Mavi Un, M.A., CCC/SLP, PPS Coordinator.  Information including medical coding, functional ability and quality indicators will be reviewed and updated through discharge.    

## 2020-07-01 NOTE — Progress Notes (Signed)
Physical Therapy Session Note  Patient Details  Name: Erica [name] MRN: [id number] Date of Birth: 03/14/1956  Today's Date: 07/01/2020 PT Individual Time: 9458-5929 PT Individual Time Calculation (min): 24 min   Short Term Goals: Week 1:  PT Short Term Goal 1 (Week 1): Pt will complete bed mobility with supervision PT Short Term Goal 2 (Week 1): pt will complete bed<>chair transfers with supervision and LRAD PT Short Term Goal 3 (Week 1): Pt will ambulate 184f with CGA and LRAD PT Short Term Goal 4 (Week 1): Pt will navigate up/down at least 4 steps with minA and LRAD  Skilled Therapeutic Interventions/Progress Updates:   Pt received sitting in WC and agreeable to PT. Pt performed 5xSTS: 21sec(average of 3 trials) (>15 sec indicates increased fall risk) min assist throughout to facilitate anterior weight shift and UE support on RW. PT instructed pt in TUG: 61 sec (average of 3 trials; >13.5 sec indicates increased fall risk) BUE support on RW and min assist from PT.  Cues for step length/height on the LLE. Gait in room to locate lotion on dresser, min assist from PT fore safety with cues as listed above. Patient returned to room and left sitting in EOB with call bell in reach and all needs met.           Therapy Documentation Precautions:  Precautions Precautions: Fall Restrictions Weight Bearing Restrictions: No    Pain: Pain Assessment Pain Scale: 0-10 Pain Score: 0-No pain  Balance: Balance Balance Assessed: Yes Standardized Balance Assessment Standardized Balance Assessment: Timed Up and Go Test Timed Up and Go Test TUG: Normal TUG Normal TUG (seconds): 61 Static Sitting Balance Static Sitting - Level of Assistance: 5: Stand by assistance Dynamic Sitting Balance Dynamic Sitting - Level of Assistance: 4: Min assist Static Standing Balance Static Standing - Level of Assistance: 4: Min assist Dynamic Standing Balance Dynamic Standing - Level of Assistance: 4: Min  assist Exercises:   Other Treatments:      Therapy/Group: Individual Therapy  ALorie Phenix1/19/2022, 5:55 PM

## 2020-07-01 NOTE — Evaluation (Signed)
Physical Therapy Assessment and Plan  Patient Details  Name: Erica [name] MRN: [id number] Date of Birth: 1955/07/19  PT Diagnosis: Abnormality of gait, Cognitive deficits, Difficulty walking, Hemiparesis non-dominant, Impaired cognition, Impaired sensation and Muscle weakness Rehab Potential: Good ELOS: 10-14 days   Today's Date: 07/01/2020 PT Individual Time: 1000-1100 PT Individual Time Calculation (min): 60 min    Hospital Problem: Active Problems:   Right thalamic infarction [facility])   Diabetic peripheral neuropathy ([facility])   Essential hypertension   AKI (acute kidney injury) ([place])   Ataxia, late effect of cerebrovascular disease   Past Medical History:  Past Medical History:  Diagnosis Date  . Arthritis    especially in shoulders  . Asthma   . Depression   . Diabetes mellitus   . GERD (gastroesophageal reflux disease)   . Headache(784.0)    "mild"  . Hypertension   . Mental disorder    depression  . Neuropathy    feet   . Pain    arthritis pain - takes tramadol as needed  . Rectal polyp    very little bleeding with bowel movements- no pain  . Suicide attempt [facility])    Past Surgical History:  Past Surgical History:  Procedure Laterality Date  . ANAL RECTAL MANOMETRY N/A 07/13/2016   Procedure: ANO RECTAL MANOMETRY;  Surgeon: Leighton Ruff, MD;  Location: [facility] ENDOSCOPY;  Service: Endoscopy;  Laterality: N/A;  . CESAREAN SECTION    . EUS N/A 11/21/2012   Procedure: LOWER ENDOSCOPIC ULTRASOUND (EUS);  Surgeon: Arta Silence, MD;  Location: [facility];  Service: Endoscopy;  Laterality: N/A;  . FLEXIBLE SIGMOIDOSCOPY N/A 11/21/2012   Procedure: FLEXIBLE SIGMOIDOSCOPY;  Surgeon: Arta Silence, MD;  Location: [facility]OSCOPY;  Service: Endoscopy;  Laterality: N/A;  . FLEXIBLE SIGMOIDOSCOPY N/A 01/28/2013   Procedure: FLEXIBLE SIGMOIDOSCOPY;  Surgeon: Leighton Ruff, MD;  Location: [facility] ENDOS[facility];  Service: Endoscopy;  Laterality: N/A;  . LAPAROSCOPIC LOW ANTERIOR RESECTION N/A  01/29/2013   Procedure: LAPAROSCOPIC LOW ANTERIOR RESECTION, Rigid Proctoscopy;  Surgeon: Leighton Ruff, MD;  Location: [facility] ORS;  Service: General;  Laterality: N/A;  . LAPAROSCOPIC SIGMOID COLECTOMY N/A 11/14/2012   Procedure: DIAGNOSTIC LAPAROSCOPY AND SIGMOIDMOIDOSCOPY ;  Surgeon: Rolm Bookbinder, MD;  Location: [facility]S;  Service: General;  Laterality: N/A;  . RECTAL ULTRASOUND N/A 07/13/2016   Procedure: RECTAL ULTRASOUND;  Surgeon: Leighton Ruff, MD;  Location: [facility] ENDOS[facility];  Service: Endoscopy;  Laterality: N/A;  . TONSILLECTOMY    . TONSILLECTOMY AND ADENOIDECTOMY      Assessment & Plan Clinical Impression: Patient is a 65 year old right-handed female with history of hypertension, rectal cancer, hyperlipidemia diabetes mellitus, COPD as well as cocaine use. Per chart review lives with her children. 1 level home 4 steps to entry. Independent with assistive device. She has a sister who checks on her routinely. Presented 06/25/2020 with left-sided weakness slurred speech as well as a fall. Patient had reportedly recent episode of questionable syncope. CT/MRI showed a 1 cm acute infarction in the anterior lateral thalamus on the right with some involvement of the posterior limb internal capsule. Acute infarction within the right caudate head. Scattered other foci of high signal diffusion imaging within the hemispheric white matter. Extensive old small vessel infarctions throughout the brain. Patient did not receive tPA. MRA was unremarkable. 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 Moderate C6-7 and mild C5-6 and C7-T1 spinal canal stenosis secondary to central disc protrusions. Admission chemistries unremarkable except glucose 128 creatinine  1.90 SARS coronavirus negative urine drug screen negative. Carotid Dopplers with no ICA stenosis. Echocardiogram with ejection fraction of 55 to 60% no wall motion abnormalities.  Maintained on Plavix and aspirin for CVA prophylaxis x3 weeks then Plavix alone. Lovenox for DVT prophylaxis. Tolerating a regular consistency diet. Due to patient's left-sided weakness and slurred speech physical medicine rehab consult was requested and patient was admitted for a comprehensive rehab program. Patient transferred to [facility] on 06/30/2020 .   Patient currently requires min with mobility secondary to muscle weakness, impaired timing and sequencing, unbalanced muscle activation and decreased motor planning, decreased motor planning and decreased standing balance and hemiplegia.  Prior to hospitalization, patient was independent  with mobility and lived with Son in a Apartment (2nd floor with no elevator access) home.  Home access is 14Stairs to enter.  Patient will benefit from skilled PT intervention to maximize safe functional mobility, minimize fall risk and decrease caregiver burden for planned discharge home with 24 hour supervision.  Anticipate patient will benefit from follow up [place] at discharge.  PT - End of Session Activity Tolerance: Tolerates 30+ min activity with multiple rests Endurance Deficit: Yes Endurance Deficit Description: benefits from brief seated rest for recovery during functional mobility tasks PT Assessment Rehab Potential (ACUTE/IP ONLY): Good PT Barriers to Discharge: [place] home environment;Decreased caregiver support;Home environment access/layout;Lack of/limited family support;Insurance for SNF coverage PT Barriers to Discharge Comments: 14 steps to enter home PT Patient demonstrates impairments in the following area(s): Balance;Motor;Endurance;Perception;Safety;Sensory;Skin Integrity PT Transfers Functional Problem(s): Bed Mobility;Bed to Chair;Car PT Locomotion Functional Problem(s): Ambulation;Stairs PT Plan PT Intensity: Minimum of 1-2 x/day ,45 to 90 minutes PT Frequency: 5 out of 7 days PT Duration Estimated Length of Stay: 10-14 days PT  Treatment/Interventions: Ambulation/gait training;Balance/vestibular training;Cognitive remediation/compensation;Community reintegration;Discharge planning;Disease management/prevention;DME/adaptive equipment instruction;Functional electrical stimulation;Functional mobility training;Neuromuscular re-education;Pain management;Patient/family education;Psychosocial support;Skin care/wound management;Stair training;Splinting/orthotics;Therapeutic Activities;Therapeutic Exercise;Wheelchair propulsion/positioning;UE/LE Coordination activities;UE/LE Strength taining/ROM;Visual/perceptual remediation/compensation PT Transfers Anticipated Outcome(s): mod I with LRAD PT Locomotion Anticipated Outcome(s): supervision with LRAD PT Recommendation Recommendations for Other Services: Neuropsych consult Follow Up Recommendations: Home health PT;24 hour supervision/assistance Patient destination: Home Equipment Recommended: To be determined   PT Evaluation Precautions/Restrictions   General   Vital Signs Pain Pain Assessment Pain Scale: 0-10 Pain Score: 0-No pain Home Living/Prior Functioning Home Living Living Arrangements: Children Available Help at Discharge: Available 24 hours/day;Family (Son) Type of Home: Apartment (2nd floor with no elevator access) Home Access: Stairs to enter Entrance Stairs-Number of Steps: 14 Entrance Stairs-Rails: Left Home Layout: One level Bathroom Shower/Tub: Tub/shower unit;Curtain Bathroom Toilet: Handicapped height Bathroom Accessibility: Yes Additional Comments: Owns a shower chair and uses a shower hose  Lives With: Son Prior Function Level of Independence: Independent with gait;Independent with transfers  Able to Take Stairs?: Yes Driving: Yes Vocation: On disability Comments: Occasionally uses her cane for mobility Vision/Perception  Perception Perception: Within Functional Limits Praxis Praxis: Intact  Cognition Overall Cognitive Status:  Impaired/Different from baseline Arousal/Alertness: Awake/alert Orientation Level: Oriented X4 Attention: Focused Focused Attention: Appears intact Memory: Impaired Memory Impairment: Decreased short term memory;Decreased recall of new information Decreased Short Term Memory: Verbal basic Awareness: Appears intact Problem Solving: Impaired Problem Solving Impairment: Verbal complex;Functional complex Executive Function: Reasoning;Organizing;Decision Making Reasoning: Impaired Reasoning Impairment: Verbal complex Organizing: Impaired Organizing Impairment: Verbal complex Decision Making: Impaired Decision Making Impairment: Verbal complex Safety/Judgment: Impaired Sensation Sensation Light Touch: Impaired by gross assessment Hot/Cold: Appears Intact Proprioception: Impaired by gross assessment (L knee with weight bearing) Stereognosis: Appears Statistician  Movements are Fluid and Coordinated: No Fine Motor Movements are Fluid and Coordinated: No Finger Nose Finger Test: Intact but limited due to LUE weakness, no dysmetria noted Heel Shin Test: Intact but limited due to LLE hip flexion weakness, no dyscoordination noted. Motor  Motor Motor: Hemiplegia   Trunk/Postural Assessment  Cervical Assessment Cervical Assessment: Within Functional Limits Thoracic Assessment Thoracic Assessment: Within Functional Limits Lumbar Assessment Lumbar Assessment: Within Functional Limits Postural Control Postural Control: Within Functional Limits  Balance Balance Balance Assessed: Yes Static Sitting Balance Static Sitting - Balance Support: Feet supported;No upper extremity supported Static Sitting - Level of Assistance: 5: Stand by assistance Dynamic Sitting Balance Dynamic Sitting - Balance Support: Feet supported;During functional activity Dynamic Sitting - Level of Assistance: 4: Min assist Static Standing Balance Static Standing - Balance Support: Bilateral  upper extremity supported Static Standing - Level of Assistance: 4: Min assist Dynamic Standing Balance Dynamic Standing - Balance Support: During functional activity;Bilateral upper extremity supported Dynamic Standing - Level of Assistance: 4: Min assist Extremity Assessment      RLE Assessment RLE Assessment: Within Functional Limits LLE Assessment LLE Assessment: Exceptions to [facility] Strength Comments: Ankle DF 2+/5, knee ext 4-/5, knee flex 3+/5, hip flex 2+/5  Care Tool Care Tool Bed Mobility Roll left and right activity   Roll left and right assist level: Contact Guard/Touching assist    Sit to lying activity   Sit to lying assist level: Minimal Assistance - Patient > 75%    Lying to sitting edge of bed activity   Lying to sitting edge of bed assist level: Minimal Assistance - Patient > 75%     Care Tool Transfers Sit to stand transfer   Sit to stand assist level: Minimal Assistance - Patient > 75%    Chair/bed transfer   Chair/bed transfer assist level: Minimal Assistance - Patient > 75%     Physiological scientist transfer assist level: Minimal Assistance - Patient > 75%      Care Tool Locomotion Ambulation   Assist level: Minimal Assistance - Patient > 75% Assistive device: Walker-rolling Max distance: 20f  Walk 10 feet activity   Assist level: Minimal Assistance - Patient > 75% Assistive device: Walker-rolling   Walk 50 feet with 2 turns activity   Assist level: Minimal Assistance - Patient > 75% Assistive device: Walker-rolling  Walk 150 feet activity   Assist level: Minimal Assistance - Patient > 75% Assistive device: Walker-rolling  Walk 10 feet on uneven surfaces activity Walk 10 feet on uneven surfaces activity did not occur: Safety/medical concerns      Stairs Stair activity did not occur: Safety/medical concerns        Walk up/down 1 step activity Walk up/down 1 step or curb (drop down) activity did not occur:  Safety/medical concerns     Walk up/down 4 steps activity did not occuR: Safety/medical concerns  Walk up/down 4 steps activity      Walk up/down 12 steps activity Walk up/down 12 steps activity did not occur: Safety/medical concerns      Pick up small objects from floor Pick up small object from the floor (from standing position) activity did not occur: Safety/medical concerns      Wheelchair Will patient use wheelchair at discharge?: No          Wheel 50 feet with 2 turns activity      Wheel 150 feet activity  Refer to Care Plan for Long Term Goals  SHORT TERM GOAL WEEK 1 PT Short Term Goal 1 (Week 1): Pt will complete bed mobility with supervision PT Short Term Goal 2 (Week 1): pt will complete bed<>chair transfers with supervision and LRAD PT Short Term Goal 3 (Week 1): Pt will ambulate 172f with CGA and LRAD PT Short Term Goal 4 (Week 1): Pt will navigate up/down at least 4 steps with minA and LRAD  Recommendations for other services: Neuropsych  Skilled Therapeutic Intervention Mobility Bed Mobility Bed Mobility: Supine to Sit;Sit to Supine Supine to Sit: Minimal Assistance - Patient > 75% Sit to Supine: Minimal Assistance - Patient > 75% Transfers Transfers: Sit to Stand;Stand Pivot Transfers;Stand to Sit Sit to Stand: Minimal Assistance - Patient > 75% Stand to Sit: Minimal Assistance - Patient > 75% Stand Pivot Transfers: Minimal Assistance - Patient > 75% Stand Pivot Transfer Details: Tactile cues for initiation;Tactile cues for weight shifting;Tactile cues for placement;Verbal cues for gait pattern;Verbal cues for precautions/safety;Verbal cues for technique;Verbal cues for sequencing;Verbal cues for safe use of DME/AE;Visual cues for safe use of DME/AE Transfer (Assistive device): Rolling walker Locomotion  Gait Ambulation: Yes Gait Assistance: Minimal Assistance - Patient > 75% Gait Distance (Feet): 75 Feet Assistive device: Rolling walker Gait  Assistance Details: Verbal cues for gait pattern;Verbal cues for precautions/safety;Verbal cues for technique;Verbal cues for sequencing;Visual cues/gestures for sequencing;Visual cues/gestures for precautions/safety;Visual cues for safe use of DME/AE;Tactile cues for sequencing;Tactile cues for weight shifting Gait Gait: Yes Gait Pattern: Impaired Gait Pattern: Decreased stance time - right;Decreased stance time - left;Decreased step length - left;Decreased step length - right;Step-to pattern;Decreased hip/knee flexion - right;Decreased hip/knee flexion - left;Decreased dorsiflexion - left;Decreased weight shift to left;Narrow base of support;Trunk flexed;Poor foot clearance - left Gait velocity: decreased Stairs / Additional Locomotion Stairs: No Wheelchair Mobility Wheelchair Mobility: No  Skilled Intervention: Pt received supine in bed, awake and agreeable to therapy. Initiated functional mobility as outlined above. Required minA for donning pants while seated EOB, requiring minA for standing balance while she pulled them over hips. Able to perform upper body dressing with setupA. Retrieved proper fitting w/c with cushion for skin protection and comfort. Pt requesting to void, so she ambulated with minA and RW to bathroom requiring cues for RW management and safety awareness. Pt continent of bladder, charted. Car transfer performed with minA and RW, again requiring safety cues for awareness during transfer. Gait training x760fwith minA and RW, gait deficits include decreased bilateral step length, decreased L foot clearance, decreased gait speed. No formal LOB or knee buckling and was able to navigate environmental barriers appropriately. Returned to her room with totalA in w/c and performed stand step pivot transfer with minA and RW to recliner. She remained seated in recliner at end of session with her needs within reach and chair alarm on.  Instructed pt in results of PT evaluation as detailed  above, PT POC, rehab potential, rehab goals, and discharge recommendations. Additionally discussed CIR's policies regarding fall safety and use of chair alarm and/or quick release belt. Pt verbalized understanding and in agreement. Will update pt's family members as they become available.   Discharge Criteria: Patient will be discharged from PT if patient refuses treatment 3 consecutive times without medical reason, if treatment goals not met, if there is a change in medical status, if patient makes no progress towards goals or if patient is discharged from hospital.  The above assessment, treatment plan, treatment alternatives and goals were  discussed and mutually agreed upon: by patient  [name] PT, DPT 07/01/2020, 12:53 PM

## 2020-07-01 NOTE — Progress Notes (Signed)
[facility] Individual Statement of Services  Patient Name:  Erica [name]  Date:  07/01/2020  Welcome to the [facility].  Our goal is to provide you with an individualized program based on your diagnosis and situation, designed to meet your specific needs.  With this comprehensive rehabilitation program, you will be expected to participate in at least 3 hours of rehabilitation therapies Monday-Friday, with modified therapy programming on the weekends.  Your rehabilitation program will include the following services:  Physical Therapy (PT), Occupational Therapy (OT), Speech Therapy (ST), 24 hour per day rehabilitation nursing, Care Coordinator, Rehabilitation Medicine, Nutrition Services and Pharmacy Services  Weekly team conferences will be held on wednesday to discuss your progress.  Your Inpatient Rehabilitation Care Coordinator will talk with you frequently to get your input and to update you on team discussions.  Team conferences with you and your family in attendance may also be held.  Expected length of stay: 10-14 days  Overall anticipated outcome: independent to supervision level  Depending on your progress and recovery, your program may change. Your Inpatient Rehabilitation Care Coordinator will coordinate services and will keep you informed of any changes. Your Inpatient Rehabilitation Care Coordinator's name and contact numbers are listed  below.  The following services may also be recommended but are not provided by the [facility] will be made to provide these services after discharge if needed.  Arrangements include referral to agencies that provide these services.  Your insurance has been verified to be:  [place] primary doctor is:  Nolene Ebbs  Pertinent information will be shared with  your doctor and your insurance company.  Inpatient Rehabilitation Care Coordinator:  Ovidio Kin, [place] or Emilia Beck  Information discussed with and copy given to patient by: Elease Hashimoto, 07/01/2020, 9:58 AM

## 2020-07-01 NOTE — Evaluation (Addendum)
Occupational Therapy Assessment and Plan  Patient Details  Name: Erica [name] MRN: [id number] Date of Birth: December 29, 1955  OT Diagnosis: cognitive deficits, hemiplegia affecting non-dominant side and muscle weakness (generalized) Rehab Potential: Rehab Potential (ACUTE ONLY): Excellent ELOS: 10-14 days   Today's Date: 07/01/2020 OT Individual Time:  - 1430-1530 Total Time: 60 min       Hospital Problem: Active Problems:   Right thalamic infarction [facility])   Diabetic peripheral neuropathy (HCC)   Essential hypertension   AKI (acute kidney injury) ([place])   Ataxia, late effect of cerebrovascular disease   Past Medical History:  Past Medical History:  Diagnosis Date  . Arthritis    especially in shoulders  . Asthma   . Depression   . Diabetes mellitus   . GERD (gastroesophageal reflux disease)   . Headache(784.0)    "mild"  . Hypertension   . Mental disorder    depression  . Neuropathy    feet   . Pain    arthritis pain - takes tramadol as needed  . Rectal polyp    very little bleeding with bowel movements- no pain  . Suicide attempt [facility])    Past Surgical History:  Past Surgical History:  Procedure Laterality Date  . ANAL RECTAL MANOMETRY N/A 07/13/2016   Procedure: ANO RECTAL MANOMETRY;  Surgeon: Leighton Ruff, MD;  Location: [facility] ENDOSCOPY;  Service: Endoscopy;  Laterality: N/A;  . CESAREAN SECTION    . EUS N/A 11/21/2012   Procedure: LOWER ENDOSCOPIC ULTRASOUND (EUS);  Surgeon: Arta Silence, MD;  Location: [facility];  Service: Endoscopy;  Laterality: N/A;  . FLEXIBLE SIGMOIDOSCOPY N/A 11/21/2012   Procedure: FLEXIBLE SIGMOIDOSCOPY;  Surgeon: Arta Silence, MD;  Location: [facility] ENDOSCOPY;  Service: Endoscopy;  Laterality: N/A;  . FLEXIBLE SIGMOIDOSCOPY N/A 01/28/2013   Procedure: FLEXIBLE SIGMOIDOSCOPY;  Surgeon: Leighton Ruff, MD;  Location: [facility] ENDOSCOPY;  Service: Endoscopy;  Laterality: N/A;  . LAPAROSCOPIC LOW ANTERIOR RESECTION N/A 01/29/2013   Procedure:  LAPAROSCOPIC LOW ANTERIOR RESECTION, Rigid Proctoscopy;  Surgeon: Leighton Ruff, MD;  Location: [facility] ORS;  Service: General;  Laterality: N/A;  . LAPAROSCOPIC SIGMOID COLECTOMY N/A 11/14/2012   Procedure: DIAGNOSTIC LAPAROSCOPY AND SIGMOIDMOIDOSCOPY ;  Surgeon: Rolm Bookbinder, MD;  Location: [facility] ORS;  Service: General;  Laterality: N/A;  . RECTAL ULTRASOUND N/A 07/13/2016   Procedure: RECTAL ULTRASOUND;  Surgeon: Leighton Ruff, MD;  Location: [facility] ENDOSCOPY;  Service: Endoscopy;  Laterality: N/A;  . TONSILLECTOMY    . TONSILLECTOMY AND ADENOIDECTOMY      Assessment & Plan Clinical Impression: Erica [name] is a 65 year old right-handed female with history of hypertension, rectal cancer, hyperlipidemia diabetes mellitus, COPD as well as cocaine use. Per chart review lives with her children. 1 level home 4 steps to entry. Independent with assistive device. She has a sister who checks on her routinely. Presented 06/25/2020 with left-sided weakness slurred speech as well as a fall. Patient had reportedly recent episode of questionable syncope. CT/MRI showed a 1 cm acute infarction in the anterior lateral thalamus on the right with some involvement of the posterior limb internal capsule. Acute infarction within the right caudate head. Scattered other foci of high signal diffusion imaging within the hemispheric white matter. Extensive old small vessel infarctions throughout the brain. Patient did not receive tPA. MRA was unremarkable. 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 Moderate C6-7 and mild C5-6 and C7-T1 spinal canal stenosis secondary to central disc protrusions. Admission chemistries unremarkable except glucose  128 creatinine 1.90 SARS coronavirus negative urine drug screen negative. Carotid Dopplers with no ICA stenosis. Echocardiogram with ejection fraction of 55 to 60% no wall motion abnormalities. Maintained on Plavix  and aspirin for CVA prophylaxis x3 weeks then Plavix alone. Lovenox for DVT prophylaxis. .  Patient transferred to CIR on 06/30/2020 .    Patient currently requires min with basic self-care skills secondary to muscle weakness, decreased cardiorespiratoy endurance, decreased coordination and decreased motor planning, decreased visual motor skills, decreased attention, decreased problem solving, decreased safety awareness and decreased memory and decreased sitting balance, decreased standing balance and hemiplegia.  Prior to hospitalization, patient could complete BADLs and IADLs with modified independent .  Patient will benefit from skilled intervention to decrease level of assist with basic self-care skills prior to discharge home with care partner.  Anticipate patient will require 24 hour supervision and follow up home health.  OT - End of Session Activity Tolerance: Tolerates 30+ min activity with multiple rests Endurance Deficit: Yes Endurance Deficit Description: reports 7/10 difficulty to complete toileting with mild shortness of breath OT Assessment Rehab Potential (ACUTE ONLY): Excellent OT Patient demonstrates impairments in the following area(s): Balance;Safety;Cognition;Sensory;Endurance;Vision;Motor OT Basic ADL's Functional Problem(s): Grooming;Bathing;Dressing;Toileting OT Advanced ADL's Functional Problem(s): Simple Meal Preparation OT Transfers Functional Problem(s): Toilet;Tub/Shower OT Plan OT Intensity: Minimum of 1-2 x/day, 45 to 90 minutes OT Frequency: 5 out of 7 days OT Duration/Estimated Length of Stay: 10-14 days OT Treatment/Interventions: Balance/vestibular training;Discharge planning;Self Care/advanced ADL retraining;Therapeutic Activities;UE/LE Coordination activities;Cognitive remediation/compensation;Disease mangement/prevention;Functional mobility training;Patient/family education;Therapeutic Exercise;DME/adaptive equipment instruction;Neuromuscular  re-education;Psychosocial support;UE/LE Strength taining/ROM OT Basic Self-Care Anticipated Outcome(s): mod I OT Toileting Anticipated Outcome(s): mod I OT Bathroom Transfers Anticipated Outcome(s): mod I OT Recommendation Patient destination: Home Follow Up Recommendations: 24 hour supervision/assistance Equipment Recommended: To be determined   OT Evaluation Precautions/Restrictions  Precautions Precautions: Fall Restrictions Weight Bearing Restrictions: No General Chart Reviewed: Yes Pain Pain Assessment Pain Scale: 0-10 Pain Score: 0-No pain Home Living/Prior Functioning Home Living Living Arrangements: Children Available Help at Discharge: Available 24 hours/day,Family Type of Home: Apartment Home Access: Stairs to enter Entrance Stairs-Number of Steps: 14 Entrance Stairs-Rails: Left Home Layout: One level Bathroom Shower/Tub: Engineer, petroleum: Handicapped height Bathroom Accessibility: Yes Additional Comments: Owns a shower chair and uses a shower hose  Lives With: Son IADL History Homemaking Responsibilities: Yes Meal Prep Responsibility: Therapist, occupational Responsibility: Primary Cleaning Responsibility: Primary Education: GED Prior Function Level of Independence: Independent with gait,Independent with transfers,Independent with basic ADLs  Able to Take Stairs?: Yes Driving: Yes Vocation: On disability Comments: Occasionally uses her cane for mobility Vision Baseline Vision/History: Wears glasses Wears Glasses: Reading only Patient Visual Report: No change from baseline Vision Assessment?: Yes Eye Alignment: Within Functional Limits Ocular Range of Motion: Within Functional Limits Alignment/Gaze Preference: Within Defined Limits Tracking/Visual Pursuits: Decreased smoothness of horizontal tracking;Decreased smoothness of vertical tracking Saccades: Additional eye shifts occurred during testing Visual Fields: No apparent  deficits Perception  Perception: Within Functional Limits Praxis Praxis: Intact Cognition Overall Cognitive Status: Impaired/Different from baseline Arousal/Alertness: Awake/alert Orientation Level: Person;Place;Situation Person: Oriented Place: Oriented Situation: Oriented Year: 2022 Month: January Day of Week: Correct Memory: Impaired Memory Impairment: Decreased short term memory;Decreased recall of new information Decreased Short Term Memory: Verbal basic;Functional basic Immediate Memory Recall: Sock;Blue;Bed Memory Recall Sock: Without Cue Memory Recall Blue: With Cue Memory Recall Bed: Not able to recall Attention: Focused Focused Attention: Appears intact Awareness: Appears intact Problem Solving: Impaired Problem Solving Impairment: Verbal complex;Functional complex;Functional basic Executive Function:  Reasoning;Organizing;Decision Making Reasoning: Impaired Reasoning Impairment: Verbal complex Organizing: Impaired Organizing Impairment: Verbal complex Decision Making: Impaired Decision Making Impairment: Verbal complex Safety/Judgment: Impaired Sensation Sensation Light Touch: Impaired by gross assessment (light touch intact BUE; pt reports numbness and tingling in all fingers of left hand) Hot/Cold: Appears Intact Proprioception: Impaired by gross assessment Stereognosis: Not tested Coordination Gross Motor Movements are Fluid and Coordinated: No Fine Motor Movements are Fluid and Coordinated: No Finger Nose Finger Test: LUE impaired slow and decreased precision; RUE intact Motor  Motor Motor: Hemiplegia  Trunk/Postural Assessment  Cervical Assessment Cervical Assessment: Within Functional Limits Thoracic Assessment Thoracic Assessment: Within Functional Limits Lumbar Assessment Lumbar Assessment: Within Functional Limits Postural Control Postural Control: Within Functional Limits  Balance Balance Balance Assessed: Yes Static Sitting  Balance Static Sitting - Balance Support: Feet supported;No upper extremity supported Static Sitting - Level of Assistance: 5: Stand by assistance Dynamic Sitting Balance Dynamic Sitting - Balance Support: Feet supported;During functional activity Dynamic Sitting - Level of Assistance: 4: Min assist Static Standing Balance Static Standing - Balance Support: Bilateral upper extremity supported Static Standing - Level of Assistance: 4: Min assist Dynamic Standing Balance Dynamic Standing - Balance Support: During functional activity;Bilateral upper extremity supported Dynamic Standing - Level of Assistance: 4: Min assist Extremity/Trunk Assessment RUE Assessment RUE Assessment: Exceptions to [facility] General Strength Comments: 4/5 LUE Assessment LUE Assessment: Exceptions to [facility] General Strength Comments: 4-/5  Care Tool Care Tool Self Care Eating   Eating Assist Level: Independent    Oral Care    Oral Care Assist Level: Supervision/Verbal cueing    Bathing         Assist Level: Minimal Assistance - Patient > 75%    Upper Body Dressing(including orthotics)       Assist Level: Minimal Assistance - Patient > 75%    Lower Body Dressing (excluding footwear)     Assist for lower body dressing: Minimal Assistance - Patient > 75%    Putting on/Taking off footwear     Assist for footwear: Minimal Assistance - Patient > 75%       Care Tool Toileting Toileting activity   Assist for toileting: Contact Guard/Touching assist     Care Tool Bed Mobility Roll left and right activity   Roll left and right assist level: Contact Guard/Touching assist    Sit to lying activity   Sit to lying assist level: Minimal Assistance - Patient > 75%    Lying to sitting edge of bed activity   Lying to sitting edge of bed assist level: Minimal Assistance - Patient > 75%     Care Tool Transfers Sit to stand transfer   Sit to stand assist level: Minimal Assistance - Patient > 75%     Chair/bed transfer   Chair/bed transfer assist level: Minimal Assistance - Patient > 75%     Toilet transfer   Assist Level: Minimal Assistance - Patient > 75%     Care Tool Cognition Expression of Ideas and Wants Expression of Ideas and Wants: Without difficulty (complex and basic) - expresses complex messages without difficulty and with speech that is clear and easy to understand   Understanding Verbal and Non-Verbal Content Understanding Verbal and Non-Verbal Content: Understands (complex and basic) - clear comprehension without cues or repetitions   Memory/Recall Ability *first 3 days only Memory/Recall Ability *first 3 days only: Current season;That he or she is in a hospital/hospital unit;Location of own room;Staff names and faces    Refer to Care Plan  for Long Term Goals  SHORT TERM GOAL WEEK 1 OT Short Term Goal 1 (Week 1): Pt will complete toilet transfer with supervision. OT Short Term Goal 2 (Week 1): Pt will complete UB/LB bathing with supervision. OT Short Term Goal 3 (Week 1): Pt will complete LB dressing with supervision. OT Short Term Goal 4 (Week 1): Pt will complete shower transfer with supervision.  Recommendations for other services: None    Skilled Therapeutic Intervention ADL Lower Body Dressing: Contact guard Where Assessed-Lower Body Dressing: Edge of bed Toileting: Contact guard Where Assessed-Toileting: Bedside Commode Toilet Transfer: Minimal assistance Toilet Transfer Method: Ambulating Mobility  Bed Mobility Bed Mobility: Supine to Sit;Sit to Supine Supine to Sit: Minimal Assistance - Patient > 75% Sit to Supine: Minimal Assistance - Patient > 75% Transfers Sit to Stand: Minimal Assistance - Patient > 75% Stand to Sit: Minimal Assistance - Patient > 75%   Skilled Intervention:  Pt semi upright in bed, no c/o pain, agreeable to OT session.  Initial evaluation complete, educated pt and sister regarding OT scope of practice, and collaborated with  pt and sister regarding OT POC.  Self care and functional mobility completed per above levels of assist required.  While toileting pt requested privacy, therefore OT educated pt to notify therapist prior to standing.  Upon OT returning to check on pt shortly after, pt was standing without assist.  Re-educated pt on importance of waiting for assist during functional mobility due to current unsteadiness. Assessed fine motor using box and blocks test: Right hand 31 blocks; left hand 27 blocks (1 minute).  Issued medium resistive sponge and instructed pt on gross grasp 2 times per day to facilitate increased grip strength.  Pt returned to bed at end of session, call bell in reach, bed alarm on.   Discharge Criteria: Patient will be discharged from OT if patient refuses treatment 3 consecutive times without medical reason, if treatment goals not met, if there is a change in medical status, if patient makes no progress towards goals or if patient is discharged from hospital.  The above assessment, treatment plan, treatment alternatives and goals were discussed and mutually agreed upon: by patient and by family  Ezekiel Slocumb 07/01/2020, 4:19 PM

## 2020-07-01 NOTE — Progress Notes (Signed)
[place] PHYSICAL MEDICINE & REHABILITATION PROGRESS NOTE  Subjective/Complaints: Patient seen laying in bed this morning.  She states she slept well overnight.  She is ready to begin therapies.  She notes stable left facial numbness and finger tingling.  ROS: Denies CP, SOB, N/V/D  Objective: Vital Signs: Blood pressure (!) 141/75, pulse 67, temperature 97.8 F (36.6 C), resp. rate 19, height _0  (1.6 m), weight 86.6 kg, SpO2 100 %. No results found. Recent Labs    07/01/20 0705  WBC 3.8*  HGB 11.0*  HCT 31.6*  PLT 194   Recent Labs    06/30/20 0513 07/01/20 0705  NA 140 141  K 4.1 3.8  CL 111 111  CO2 22 23  GLUCOSE 135* 75  BUN 30* 27*  CREATININE 1.74* 1.91*  CALCIUM 9.0 9.0    Intake/Output Summary (Last 24 hours) at 07/01/2020 1059 Last data filed at 06/30/2020 1832 Gross per 24 hour  Intake 200 ml  Output --  Net 200 ml        Physical Exam: BP (!) 141/75   Pulse 67   Temp 97.8 F (36.6 C)   Resp 19   Ht _1  (1.6 m)   Wt 86.6 kg   SpO2 100%   BMI 33.82 kg/m  Constitutional: No distress . Vital signs reviewed.  Obese. HENT: Normocephalic.  Atraumatic. Eyes: EOMI. No discharge. Cardiovascular: No JVD.  RRR. Respiratory: Normal effort.  No stridor.  Bilateral clear to auscultation. GI: Non-distended.  BS +. Skin: Warm and dry.  Intact. Psych: Normal mood.  Normal behavior. Musc: No edema in extremities.  No tenderness in extremities. Neuro: Alert Motor: 5/5 throughout bilateral upper extremities Left-sided ataxia  Assessment/Plan: 1. Functional deficits which require 3+ hours per day of interdisciplinary therapy in a comprehensive inpatient rehab setting.  Physiatrist is providing close team supervision and 24 hour management of active medical problems listed below.  Physiatrist and rehab team continue to assess barriers to discharge/monitor patient progress toward functional and medical goals   Care Tool:  Bathing               Bathing assist       Upper Body Dressing/Undressing Upper body dressing        Upper body assist      Lower Body Dressing/Undressing Lower body dressing            Lower body assist       Toileting Toileting    Toileting assist Assist for toileting: Contact Guard/Touching assist     Transfers Chair/bed transfer  Transfers assist           Locomotion Ambulation   Ambulation assist      Assist level: Moderate Assistance - Patient 50 - 74% Assistive device: Walker-standard     Walk 10 feet activity   Assist           Walk 50 feet activity   Assist           Walk 150 feet activity   Assist           Walk 10 feet on uneven surface  activity   Assist           Wheelchair     Assist               Wheelchair 50 feet with 2 turns activity    Assist            Wheelchair 150 feet activity  Assist           Medical Problem List and Plan: 1.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   Begin CIR evaluations  Team conference today to discuss current and goals and coordination of care, home and environmental barriers, and discharge planning with nursing, case manager, and therapies. Please see conference note from today as well.  2. Antithrombotics: -DVT/anticoagulation:Lovenox -antiplatelet therapy: Aspirin 81 mg daily and Plavix 75 mg daily x3 weeks then Plavix alone 3. Pain Management:Neurontin 300 mg 3 times daily  Monitor with increased mobility 4. Mood:Lexapro 10 mg daily, BuSpar 10 mg daily, Vistaril 25 mg twice daily -antipsychotic agents: N/A 5. Neuropsych: This patientiscapable of making decisions on herown behalf. 6. Skin/Wound Care:Routine skin checks 7. Fluids/Electrolytes/Nutrition:Routine in and outs 8. Hypertension. Norvasc 10 mg daily, Toprol-XL 50 mg daily  Monitor with  increased mobility. 9. Diabetes mellitus with peripheral neuropathy. Hemoglobin A1c 5.2. Lantus insulin 25 units twice daily.   Monitor with increased mobility  10. COPD/asthmaPirfenidone801 mg 3 times daily, Ellipta inhaler 1 puff daily, Dulera 2 puffs twice daily -Pirfenidone makes her nauseas, sometimes have diarrhea---treat supportively as she wants to stay on medication 11.  Hyperlipidemia: Lipitor, fenofibrate,Vascepa2 mg twice daily 12. History of cocaine use remote tobacco abuse. Urine drug screen negative 13. GERD. Protonix 14.?  AKI on CKD  Creatinine 1.91 on 1/19  Encourage fluids  LOS: 1 days A FACE TO FACE EVALUATION WAS PERFORMED  Furkan Keenum Lorie Phenix 07/01/2020, 10:59 AM

## 2020-07-01 NOTE — Evaluation (Addendum)
Speech Language Pathology Assessment and Plan  Patient Details  Name: Erica [name] MRN: [id number] Date of Birth: 1956-02-27  SLP Diagnosis: Cognitive Impairments  Rehab Potential: Excellent ELOS: 7-10 days   Today's Date: 07/01/2020 SLP Individual Time: 5400-8676 SLP Individual Time Calculation (min): 30 min  Hospital Problem: Active Problems:   Right thalamic infarction [facility])   Diabetic peripheral neuropathy (HCC)   Essential hypertension   AKI (acute kidney injury) ([place])   Ataxia, late effect of cerebrovascular disease  Past Medical History:  Past Medical History:  Diagnosis Date  . Arthritis    especially in shoulders  . Asthma   . Depression   . Diabetes mellitus   . GERD (gastroesophageal reflux disease)   . Headache(784.0)    "mild"  . Hypertension   . Mental disorder    depression  . Neuropathy    feet   . Pain    arthritis pain - takes tramadol as needed  . Rectal polyp    very little bleeding with bowel movements- no pain  . Suicide attempt [facility])    Past Surgical History:  Past Surgical History:  Procedure Laterality Date  . ANAL RECTAL MANOMETRY N/A 07/13/2016   Procedure: ANO RECTAL MANOMETRY;  Surgeon: Leighton Ruff, MD;  [facility];  Service: Endoscopy;  Laterality: N/A;  . CESAREAN SECTION    . EUS N/A 11/21/2012   Procedure: LOWER ENDOSCOPIC ULTRASOUND (EUS);  Surgeon: Arta Silence, MD;  [facility];  Service: Endoscopy;  Laterality: N/A;  . FLEXIBLE SIGMOIDOSCOPY N/A 11/21/2012   Procedure: FLEXIBLE SIGMOIDOSCOPY;  Surgeon: Arta Silence, MD;  [facility];  Service: Endoscopy;  Laterality: N/A;  . FLEXIBLE SIGMOIDOSCOPY N/A 01/28/2013   Procedure: FLEXIBLE SIGMOIDOSCOPY;  Surgeon: Leighton Ruff, MD;  [facility];  Service: Endoscopy;  Laterality: N/A;  . LAPAROSCOPIC LOW ANTERIOR RESECTION N/A 01/29/2013   Procedure: LAPAROSCOPIC LOW ANTERIOR RESECTION, Rigid Proctoscopy;  Surgeon: Leighton Ruff, MD;   [facility];  Service: General;  Laterality: N/A;  . LAPAROSCOPIC SIGMOID COLECTOMY N/A 11/14/2012   Procedure: DIAGNOSTIC LAPAROSCOPY AND SIGMOIDMOIDOSCOPY ;  Surgeon: Rolm Bookbinder, MD;  [facility];  Service: General;  Laterality: N/A;  . RECTAL ULTRASOUND N/A 07/13/2016   Procedure: RECTAL ULTRASOUND;  Surgeon: Leighton Ruff, MD;  [facility];  Service: Endoscopy;  Laterality: N/A;  . TONSILLECTOMY    . TONSILLECTOMY AND ADENOIDECTOMY      Assessment / Plan / Recommendation Clinical Impression Patient is a 65 year old right-handed female with history of hypertension, rectal cancer, hyperlipidemia diabetes mellitus, COPD as well as cocaine use. Per chart review lives with her children. 1 level home 4 steps to entry. Independent with assistive device. She has a sister who checks on her routinely. Presented 06/25/2020 with left-sided weakness slurred speech as well as a fall. Patient had reportedly recent episode of questionable syncope. CT/MRI showed a 1 cm acute infarction in the anterior lateral thalamus on the right with some involvement of the posterior limb internal capsule. Acute infarction within the right caudate head. Scattered other foci of high signal diffusion imaging within the hemispheric white matter. Extensive old small vessel infarctions throughout the brain. Patient did not receive tPA. MRA was unremarkable. 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 Moderate C6-7 and mild C5-6 and C7-T1 spinal canal stenosis secondary to central disc protrusions. Admission chemistries unremarkable except glucose 128 creatinine 1.90 SARS coronavirus negative urine drug screen negative. Carotid Dopplers with no ICA  stenosis. Echocardiogram with ejection fraction of 55 to 60% no wall motion abnormalities. Maintained on Plavix and aspirin for CVA prophylaxis x3 weeks then Plavix alone. Lovenox for  DVT prophylaxis. Tolerating a regular consistency diet. Due to patient's left-sided weakness and slurred speech physical medicine rehab consult was requested and patient was admitted for a comprehensive rehab program. Patient transferred to CIR on 06/30/2020 .   Pt presents with moderate cognitive communication impairment with most notable deficits in areas of executive function, short term recall, working memory, attention and problem solving. Pt did report receiving an upsetting phone call right before assessment and states that impacted performance. Pt oriented x4 with good recall of events leading up to hospitalization and rationale behind stay.  Pt also endorses impaired memory at baseline. Pt OME generally WFL, however pt states she still has mild loss of sensation in left cheek/lip. Pt speech 100% intelligible at complex conversation level, pt states speech still feels "a little slow but not slurred". Pt provided education re: compensatory strategies for speech intelligibility. Pt will benefit from skilled ST in CIR to increasing cognitive function to goal Supervision/Mod I for safe d/c to prior level of function.    Skilled Therapeutic Interventions          Patient participating in differing assessments of cognitive linguistic function. Please see above for details.    SLP Assessment  Patient will need skilled [place] [facility] during CIR admission    Recommendations  Recommendations for Other Services: Neuropsych consult Patient destination: Home Follow up Recommendations: Other (comment) (tbd) Equipment Recommended: To be determined    SLP Frequency 3 to 5 out of 7 days   SLP Duration  SLP Intensity  SLP Treatment/Interventions 7-10 days  Minumum of 1-2 x/day, 30 to 90 minutes  Cognitive remediation/compensation;Therapeutic Activities;Therapeutic Exercise;Patient/family education    Pain Pain Assessment Pain Scale: 0-10 Pain Score: 0-No pain  Prior  Functioning Cognitive/Linguistic Baseline: Within functional limits Type of Home: Apartment (2nd floor with no elevator access)  Lives With: Son Available Help at Discharge: Available 24 hours/day;Family (Son) Education: GED Vocation: On disability  SLP Evaluation Cognition Overall Cognitive Status: Impaired/Different from baseline Arousal/Alertness: Awake/alert Orientation Level: Oriented X4 Attention: Focused Focused Attention: Appears intact Memory: Impaired Memory Impairment: Decreased short term memory;Decreased recall of new information Decreased Short Term Memory: Verbal basic Awareness: Appears intact Problem Solving: Impaired Problem Solving Impairment: Verbal complex;Functional complex Executive Function: Reasoning;Organizing;Decision Making Reasoning: Impaired Reasoning Impairment: Verbal complex Organizing: Impaired Organizing Impairment: Verbal complex Decision Making: Impaired Decision Making Impairment: Verbal complex Safety/Judgment: Impaired  Comprehension Auditory Comprehension Overall Auditory Comprehension: Appears within functional limits for tasks assessed Expression Verbal Expression Overall Verbal Expression: Appears within functional limits for tasks assessed Oral Motor Oral Motor/Sensory Function Overall Oral Motor/Sensory Function: Within functional limits Motor Speech Overall Motor Speech: Appears within functional limits for tasks assessed  Care Tool Care Tool Cognition Expression of Ideas and Wants Expression of Ideas and Wants: Without difficulty (complex and basic) - expresses complex messages without difficulty and with speech that is clear and easy to understand   Understanding Verbal and Non-Verbal Content Understanding Verbal and Non-Verbal Content: Understands (complex and basic) - clear comprehension without cues or repetitions   Memory/Recall Ability *first 3 days only Memory/Recall Ability *first 3 days only: Current season;That  he or she is in a hospital/hospital unit;Location of own room;Staff names and faces    Short Term Goals: Week 1: SLP Short Term Goal 1 (Week 1): Pt will increase functional  recall of novel information with use of trained compensatory strategies with 80% accuracy SLP Short Term Goal 2 (Week 1): Pt will complete complex problem solving tasks to increase safety at current and d/c environment with Supervision A SLP Short Term Goal 3 (Week 1): Pt will increase executive function skills (planning, organizing) to 90% accuracy with Supervision A SLP Short Term Goal 4 (Week 1): Pt will increase sustained attention to >15 minutes with Supervision A  Refer to Care Plan for Long Term Goals  Recommendations for other services: Neuropsych  Discharge Criteria: Patient will be discharged from SLP if patient refuses treatment 3 consecutive times without medical reason, if treatment goals not met, if there is a change in medical status, if patient makes no progress towards goals or if patient is discharged from hospital.  The above assessment, treatment plan, treatment alternatives and goals were discussed and mutually agreed upon: by patient  Dewaine Conger 07/01/2020, 12:57 PM

## 2020-07-01 NOTE — Progress Notes (Signed)
Inpatient Rehabilitation Care Coordinator Assessment and Plan Patient Details  Name: Erica [name] MRN: [id number] Date of Birth: 08/12/1955  Today's Date: 07/01/2020  Hospital Problems: Active Problems:   Right thalamic infarction [facility])  Past Medical History:  Past Medical History:  Diagnosis Date  . Arthritis    especially in shoulders  . Asthma   . Depression   . Diabetes mellitus   . GERD (gastroesophageal reflux disease)   . Headache(784.0)    "mild"  . Hypertension   . Mental disorder    depression  . Neuropathy    feet   . Pain    arthritis pain - takes tramadol as needed  . Rectal polyp    very little bleeding with bowel movements- no pain  . Suicide attempt [facility])    Past Surgical History:  Past Surgical History:  Procedure Laterality Date  . ANAL RECTAL MANOMETRY N/A 07/13/2016   Procedure: ANO RECTAL MANOMETRY;  Surgeon: Leighton Ruff, MD;  [facility];  Service: Endoscopy;  Laterality: N/A;  . CESAREAN SECTION    . EUS N/A 11/21/2012   Procedure: LOWER ENDOSCOPIC ULTRASOUND (EUS);  Surgeon: Arta Silence, MD;  [facility];  Service: Endoscopy;  Laterality: N/A;  . FLEXIBLE SIGMOIDOSCOPY N/A 11/21/2012   Procedure: FLEXIBLE SIGMOIDOSCOPY;  Surgeon: Arta Silence, MD;  [facility];  Service: Endoscopy;  Laterality: N/A;  . FLEXIBLE SIGMOIDOSCOPY N/A 01/28/2013   Procedure: FLEXIBLE SIGMOIDOSCOPY;  Surgeon: Leighton Ruff, MD;  [facility];  Service: Endoscopy;  Laterality: N/A;  . LAPAROSCOPIC LOW ANTERIOR RESECTION N/A 01/29/2013   Procedure: LAPAROSCOPIC LOW ANTERIOR RESECTION, Rigid Proctoscopy;  Surgeon: Leighton Ruff, MD;  [facility];  Service: General;  Laterality: N/A;  . LAPAROSCOPIC SIGMOID COLECTOMY N/A 11/14/2012   Procedure: DIAGNOSTIC LAPAROSCOPY AND SIGMOIDMOIDOSCOPY ;  Surgeon: Rolm Bookbinder, MD;  [facility];  Service: General;  Laterality: N/A;  . RECTAL ULTRASOUND N/A 07/13/2016   Procedure:  RECTAL ULTRASOUND;  Surgeon: Leighton Ruff, MD;  [facility];  Service: Endoscopy;  Laterality: N/A;  . TONSILLECTOMY    . TONSILLECTOMY AND ADENOIDECTOMY     Social History:  reports that she quit smoking about 2 years ago. Her smoking use included cigarettes. She has a 10.00 pack-year smoking history. She has never used smokeless tobacco. She reports previous alcohol use. She reports previous drug use. Drug: "Crack" cocaine.  Family / Support Systems Marital Status: Single Patient Roles: Parent,Other (Comment) (sibling) Children: Dan Humphreys [phone] Other Supports: [name] jeffries-sister [phone] Anticipated Caregiver: Jeneen Rinks Ability/Limitations of Caregiver: Taking class at home for certification-not working currently Caregiver Availability: 24/7 Family Dynamics: Close with only son and two sister's. She has always been independent and taken care of herself. She does not like relying upon others  Social History Preferred language: English Religion: None Cultural Background: No issues Education: HS Read: Yes Write: Yes Employment Status: Retired Public relations account executive Issues: No issues Guardian/Conservator: None-according to MD pt is capable of making her own decisions while here   Abuse/Neglect Abuse/Neglect Assessment Can Be Completed: Yes Physical Abuse: Denies Verbal Abuse: Denies Sexual Abuse: Denies Exploitation of patient/patient's resources: Denies Self-Neglect: Denies  Emotional Status Pt's affect, behavior and adjustment status: Pt is motivated to do well and feels she can do for herself no matter what. She does not plan to ask her son to assist her. She takes care of her little dog and hopes son will move out once he is finished with his class for certification Recent Psychosocial Issues: other  health issues-was in the process of moving to first floor apartment due to 14 steps too many for her pulmonary issues Psychiatric History: No  issues-deferred depression screen due to coping appropriately and feels she is doing well. She knows she will get independent again Substance Abuse History: history of subtance abuse-cocaine pt reports not now, too many health issues  Patient / Family Perceptions, Expectations & Goals Pt/Family understanding of illness & functional limitations: Pt and sister can explain her stroke and deficits. Pt is encouraged by the progress she is making already and not worried about going home and her care. She says she will manage always has Premorbid pt/family roles/activities: Mom, sister, retiree, Doctor, general practice, etc Anticipated changes in roles/activities/participation: resume Pt/family expectations/goals: Pt and sister have a good understanding of her stroke and treatment plan going forward. Pt has no worries and feels it will all work out. Sister is one who tries to get pt to be more compliant with her diet and MD recommendations.Pt feels she is just [employer]: None Premorbid Home Care/DME Agencies: Other (Comment) (has cane and tub seat) Transportation available at discharge: Pt did drive but now will have son drive her to her appointments  Discharge Planning Living Arrangements: Children Support Systems: Children,Other relatives,Friends/neighbors Type of Residence: Private residence Insurance Resources: Multimedia programmer (specify) Actor) Financial Resources: Radio broadcast assistant Screen Referred: No Living Expenses: Rent Money Management: Patient Does the patient have any problems obtaining your medications?: No Home Management: Patient Patient/Family Preliminary Plans: Return home with her son who is there for a short time finishing a class for a certification. He is not currently working and can be there with her. Pt has a dog she takes out and cares for. She was in the process of moving a first floor apartment but currently has 14 steps to get to  her second floor apartment. Team evaluating her and setting goals. Care Coordinator Barriers to Discharge: Other (comments) Care Coordinator Barriers to Discharge Comments: 14 steps to apartment Care Coordinator Anticipated Follow Up Needs: HH/OP  Clinical Impression Pleasant carefree attitude pt who is not worried about anything feels she can manage. Her son is living with her currently and can assist, but pt does not want him too. Was in the process of moving to first floor but on hold due to her condition and weather situation. Cares for small dog. Await team evaluations and work on a safe plan for her.  Elease Hashimoto 07/01/2020, 9:55 AM

## 2020-07-01 NOTE — IPOC Note (Addendum)
Individualized overall Plan of Care [facility]) Patient Details Name: Erica [name] MRN: [id number] DOB: Oct 27, 1955  Admitting Diagnosis: Right thalamic infarction [facility])  Hospital Problems: Principal Problem:   Right thalamic infarction [facility]) Active Problems:   Diabetic peripheral neuropathy ([place])   Essential hypertension   AKI (acute kidney injury) ([place])   Ataxia, late effect of cerebrovascular disease   Labile blood glucose   Labile blood pressure     Functional Problem List: Nursing Endurance,Medication Management,Safety  PT Balance,Motor,Endurance,Perception,Safety,Sensory,Skin Integrity  OT Balance,Safety,Cognition,Sensory,Endurance,Vision,Motor  SLP Cognition,Safety  TR         Basic ADL's: OT Grooming,Bathing,Dressing,Toileting     Advanced  ADL's: OT Simple Meal Preparation     Transfers: PT Bed Mobility,Bed to Chair,Car  OT Toilet,Tub/Shower     Locomotion: PT Ambulation,Stairs     Additional Impairments: OT    SLP        TR      Anticipated Outcomes Item Anticipated Outcome  Self Feeding    Swallowing      Basic self-care  mod I  Toileting  mod I   Bathroom Transfers mod I  Bowel/Bladder  n/a  Transfers  mod I with LRAD  Locomotion  supervision with LRAD  Communication     Cognition  Supervision A  Pain  n/a  Safety/Judgment  Maintain safety with cues/reminders   Therapy Plan: PT Intensity: Minimum of 1-2 x/day ,45 to 90 minutes PT Frequency: 5 out of 7 days PT Duration Estimated Length of Stay: 10-14 days OT Intensity: Minimum of 1-2 x/day, 45 to 90 minutes OT Frequency: 5 out of 7 days OT Duration/Estimated Length of Stay: 10-14 days SLP Intensity: Minumum of 1-2 x/day, 30 to 90 minutes SLP Frequency: 3 to 5 out of 7 days SLP Duration/Estimated Length of Stay: 7-10 days    Team Interventions: Nursing Interventions Patient/Family Education,Disease Management/Prevention,Medication [employer] Planning  PT interventions  Ambulation/gait training,Balance/vestibular training,Cognitive remediation/compensation,Community reintegration,Discharge planning,Disease IT sales professional stimulation,Functional mobility training,Neuromuscular re-education,Pain management,Patient/family education,Psychosocial support,Skin care/wound management,Stair training,Splinting/orthotics,Therapeutic Activities,Therapeutic Exercise,Wheelchair propulsion/positioning,UE/LE Coordination activities,UE/LE Strength taining/ROM,Visual/perceptual remediation/compensation  OT Interventions Balance/vestibular training,Discharge planning,Self Care/advanced ADL retraining,Therapeutic Activities,UE/LE Coordination activities,Cognitive remediation/compensation,Disease mangement/prevention,Functional mobility training,Patient/family education,Therapeutic Community education officer re-education,Psychosocial support,UE/LE Strength taining/ROM  SLP Interventions Cognitive remediation/compensation,Therapeutic Activities,Therapeutic Exercise,Patient/family education  TR Interventions    SW/CM Interventions Discharge Planning,Psychosocial Support,Patient/Family Education   Barriers to Discharge MD  Medical stability, Weight and History of substance abuse  Nursing Decreased caregiver support,Home environment access/layout    PT Inaccessible home environment,Decreased caregiver support,Home environment access/layout,Lack of/limited family support,Insurance for SNF coverage 14 steps to enter home  OT      SLP      SW Other (comments) 14 steps to apartment   Team Discharge Planning: Destination: PT-Home ,OT- Home , SLP-Home Projected Follow-up: PT-Home health PT,24 hour supervision/assistance, OT-  24 hour supervision/assistance, SLP-Other (comment) (tbd) Projected Equipment Needs: PT-To be determined, OT- To be determined, SLP-To be determined Equipment Details: PT- ,  OT-  Patient/family involved in discharge planning: PT- Patient,  OT-Patient,Family member/caregiver, SLP-Patient  MD ELOS: 9-13 days. Medical Rehab Prognosis:  Good Assessment: 65 year old right-handed female with history of hypertension, rectal cancer, hyperlipidemia diabetes mellitus, COPD as well as cocaine use. Presented 06/25/2020 with left-sided weakness slurred speech as well as a fall. Patient had reportedly recent episode of questionable syncope. CT/MRI showed a 1 cm acute infarction in the anterior lateral thalamus on the right with some involvement of the posterior limb internal capsule. Acute infarction within the right caudate head. Scattered  other foci of high signal diffusion imaging within the hemispheric white matter. Extensive old small vessel infarctions throughout the brain. Patient did not receive tPA. MRA was unremarkable. 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 Moderate C6-7 and mild C5-6 and C7-T1 spinal canal stenosis secondary to central disc protrusions. Admission chemistries unremarkable except glucose 128 creatinine 1.90 SARS coronavirus negative urine drug screen negative. Carotid Dopplers with no ICA stenosis. Echocardiogram with ejection fraction of 55 to 60% no wall motion abnormalities. Maintained on Plavix and aspirin for CVA prophylaxis x3 weeks then Plavix alone. Tolerating a regular consistency diet. Due to patient's ataxia and slurred speech physical medicine rehab consult was requested and patient was admitted for a comprehensive rehab program.  Patient with resulting functional deficits with mobility, transfers, endurance, self-care.  We will set goals for mod I with PT/OT and supervision with SLP.  Due to the current state of emergency, patients may not be receiving their 3-hours of Medicare-mandated therapy.  See Team Conference Notes for weekly updates to the plan of care

## 2020-07-02 ENCOUNTER — Inpatient Hospital Stay (HOSPITAL_COMMUNITY): Payer: Medicare HMO

## 2020-07-02 ENCOUNTER — Inpatient Hospital Stay (HOSPITAL_COMMUNITY): Payer: Medicare HMO | Admitting: Occupational Therapy

## 2020-07-02 ENCOUNTER — Inpatient Hospital Stay (HOSPITAL_COMMUNITY): Payer: Medicare HMO | Admitting: Speech Pathology

## 2020-07-02 DIAGNOSIS — R7309 Other abnormal glucose: Secondary | ICD-10-CM

## 2020-07-02 DIAGNOSIS — R0989 Other specified symptoms and signs involving the circulatory and respiratory systems: Secondary | ICD-10-CM

## 2020-07-02 LAB — GLUCOSE, CAPILLARY
Glucose-Capillary: 181 mg/dL — ABNORMAL HIGH (ref 70–99)
Glucose-Capillary: 92 mg/dL (ref 70–99)
Glucose-Capillary: 215 mg/dL — ABNORMAL HIGH (ref 70–99)
Glucose-Capillary: 259 mg/dL — ABNORMAL HIGH (ref 70–99)

## 2020-07-02 MED ORDER — TRAZODONE HCL 50 MG PO TABS
25.0000 mg | ORAL_TABLET | Freq: Every evening | ORAL | Status: DC | PRN
Start: 2020-07-02 — End: 2020-07-15
  Administered 2020-07-02: 50 mg via ORAL
  Filled 2020-07-02: qty 1

## 2020-07-02 NOTE — Progress Notes (Signed)
Physical Therapy Session Note  Patient Details  Name: Erica [name] MRN: [id number] Date of Birth: [birth year]  Today's Date: 07/02/2020 PT Individual Time: 0800-0855 PT Individual Time Calculation (min): 55 min   Short Term Goals: Week 1:  PT Short Term Goal 1 (Week 1): Pt will complete bed mobility with supervision PT Short Term Goal 2 (Week 1): pt will complete bed<>chair transfers with supervision and LRAD PT Short Term Goal 3 (Week 1): Pt will ambulate 158f with CGA and LRAD PT Short Term Goal 4 (Week 1): Pt will navigate up/down at least 4 steps with minA and LRAD  Skilled Therapeutic Interventions/Progress Updates:    Pt greeted supine in bed, awake and agreeable to therapy. No reports of pain but endorses being drowsy from poor nights rest. Supine<>sit with supervision with use of bed features and pt reporting need to void. Sit<>stand with CGA from EOB height to RW and she ambulated with minA and RW to her bathroom. Pt able to manage lower body dressing with CGA for balance and pt was continent of bladder, charted. W/c transport to main therapy gym for time management.  Gait training [id number]+ [id number] CGA and RW, cues for increasing L heel strike and L foot clearance as well as attempting to progress from her step-to gait pattern to step-through. No formal LOB, knee buckling, or knee hyperextension noted but unsteadiness present. Pt reporting moderate fatigue after completion, requiring extended seated rest break for recovery.  Stair training 2x4 steps, 6inch steps, (seated rest break b/w bouts) with 2 hand rails, requiring minA for completion. x1 instance of L knee buckling during ascent and x1 knee hyperextension during descent. Required mod cues for proper sequencing with R foot leading ascent and L foot leading descent. She will require further stair training for both endurance AND technique.  W/c transport back to her room with totalA for time management. Pt requesting to remain  sitting up in her recliner. Ambulated with CGA and RW within her room with difficulty noted managing tight spaces with her RW. She remained seated in recliner at end of session with chair alarm on and her needs within reach.  Therapy Documentation Precautions:  Precautions Precautions: Fall Restrictions Weight Bearing Restrictions: No  Therapy/Group: Individual Therapy  Karli Wickizer P Sherral Dirocco PT 07/02/2020, 7:33 AM

## 2020-07-02 NOTE — Progress Notes (Signed)
Occupational Therapy Session Note  Patient Details  Name: Erica [name] MRN: [id number] Date of Birth: 10-16-1955  Today's Date: 07/02/2020 OT Individual Time: 1859-0931 OT Individual Time Calculation (min): 51 min    Short Term Goals: Week 1:  OT Short Term Goal 1 (Week 1): Pt will complete toilet transfer with supervision. OT Short Term Goal 2 (Week 1): Pt will complete UB/LB bathing with supervision. OT Short Term Goal 3 (Week 1): Pt will complete LB dressing with supervision. OT Short Term Goal 4 (Week 1): Pt will complete shower transfer with supervision.  Skilled Therapeutic Interventions/Progress Updates:    Pt sitting up in recliner, no c/o pain, requesting to use bathroom.  Pt completed sit>stand and ambulated to bathroom using RW with supervision.  Toilet transfer completed with supervision with VCs for closer approach to 3 in 1 commode prior to sitting.  Pt had continent episode of urine and completed toileting with supervision.  Ambulated using RW to sink with CGA.  Pt completed UB bathing and dressing with setup.  Pt completed LB bathing with CGA during standing portion.  Pt required min assist to thread pant leg over left foot.  Pt able to donn underwear and socks with supervision needing min VCs for problem solving and compensatory technique implementation.  Pt also applied lotion to UB/LB with setup.  Pt ambulated from sink to recliner with supervision.  BLE elevated, call bell in reach, seat alarm on.    Therapy Documentation Precautions:  Precautions Precautions: Fall Restrictions Weight Bearing Restrictions: No   Therapy/Group: Individual Therapy  Ezekiel Slocumb 07/02/2020, 10:32 AM

## 2020-07-02 NOTE — Progress Notes (Signed)
[place] PHYSICAL MEDICINE & REHABILITATION PROGRESS NOTE  Subjective/Complaints: Patient seen sitting up in bed this morning.  She states she did not sleep well overnight because "a lot on my mind".  She states he had a good first day of therapies yesterday.  ROS: Denies CP, SOB, N/V/D  Objective: Vital Signs: Blood pressure (!) 156/81, pulse (!) 59, temperature 97.8 F (36.6 C), temperature source Oral, resp. rate 16, height _0  (1.6 m), weight 86.6 kg, SpO2 100 %. No results found. Recent Labs    07/01/20 0705  WBC 3.8*  HGB 11.0*  HCT 31.6*  PLT 194   Recent Labs    06/30/20 0513 07/01/20 0705  NA 140 141  K 4.1 3.8  CL 111 111  CO2 22 23  GLUCOSE 135* 75  BUN 30* 27*  CREATININE 1.74* 1.91*  CALCIUM 9.0 9.0    Intake/Output Summary (Last 24 hours) at 07/02/2020 1038 Last data filed at 07/01/2020 1900 Gross per 24 hour  Intake 240 ml  Output --  Net 240 ml        Physical Exam: BP (!) 156/81 (BP Location: Right Arm)   Pulse (!) 59   Temp 97.8 F (36.6 C) (Oral)   Resp 16   Ht _1  (1.6 m)   Wt 86.6 kg   SpO2 100%   BMI 33.82 kg/m  Constitutional: No distress . Vital signs reviewed.  Obese. HENT: Normocephalic.  Atraumatic. Eyes: EOMI. No discharge. Cardiovascular: No JVD.  RRR. Respiratory: Normal effort.  No stridor.  Bilateral clear to auscultation. GI: Non-distended.  BS +. Skin: Warm and dry.  Intact. Psych: Normal mood.  Normal behavior. Musc: No edema in extremities.  No tenderness in extremities. Neuro: Alert Motor: RUE/RLE: 5/5 proximal distally in LUE: 4+/5 proximal distal LLE: Hip flexion, knee extension 4+/5, ankle dorsiflexion 4/5 with apraxia Left-sided ataxia  Assessment/Plan: 1. Functional deficits which require 3+ hours per day of interdisciplinary therapy in a comprehensive inpatient rehab setting.  Physiatrist is providing close team supervision and 24 hour management of active medical problems listed  below.  Physiatrist and rehab team continue to assess barriers to discharge/monitor patient progress toward functional and medical goals   Care Tool:  Bathing              Bathing assist Assist Level: Minimal Assistance - Patient > 75%     Upper Body Dressing/Undressing Upper body dressing        Upper body assist Assist Level: Minimal Assistance - Patient > 75%    Lower Body Dressing/Undressing Lower body dressing      What is the patient wearing?: Pants     Lower body assist Assist for lower body dressing: Minimal Assistance - Patient > 75%     Toileting Toileting    Toileting assist Assist for toileting: Minimal Assistance - Patient > 75%     Transfers Chair/bed transfer  Transfers assist     Chair/bed transfer assist level: Contact Guard/Touching assist     Locomotion Ambulation   Ambulation assist      Assist level: Contact Guard/Touching assist Assistive device: Walker-rolling Max distance: 16f   Walk 10 feet activity   Assist     Assist level: Contact Guard/Touching assist Assistive device: Walker-rolling   Walk 50 feet activity   Assist    Assist level: Contact Guard/Touching assist Assistive device: Walker-rolling    Walk 150 feet activity   Assist    Assist level: Contact Guard/Touching assist Assistive device:  Walker-rolling    Walk 10 feet on uneven surface  activity   Assist Walk 10 feet on uneven surfaces activity did not occur: Safety/medical concerns         Wheelchair     Assist Will patient use wheelchair at discharge?: No             Wheelchair 50 feet with 2 turns activity    Assist            Wheelchair 150 feet activity     Assist           Medical Problem List and Plan: 1. 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   Continue CIR 2.  Antithrombotics: -DVT/anticoagulation:Lovenox -antiplatelet therapy: Aspirin 81 mg daily and Plavix 75 mg daily x3 weeks then Plavix alone 3. Pain Management:Neurontin 300 mg 3 times daily  Controlled with meds on 1/20  Monitor with increased mobility 4. Mood:Lexapro 10 mg daily, BuSpar 10 mg daily, Vistaril 25 mg twice daily -antipsychotic agents: N/A 5. Neuropsych: This patientiscapable of making decisions on herown behalf. 6. Skin/Wound Care:Routine skin checks 7. Fluids/Electrolytes/Nutrition:Routine in and outs 8. Hypertension. Norvasc 10 mg daily, Toprol-XL 50 mg daily  Slightly labile on 1/20, monitor for trend  Monitor with increased mobility. 9. Diabetes mellitus with peripheral neuropathy. Hemoglobin A1c 5.2. Lantus insulin 25 units twice daily.   Labile on 1/20, monitor for trend  Monitor with increased mobility  10. COPD/asthmaPirfenidone801 mg 3 times daily, Ellipta inhaler 1 puff daily, Dulera 2 puffs twice daily -Pirfenidone makes her nauseas, sometimes have diarrhea---treat supportively as she wants to stay on medication 11.  Hyperlipidemia: Lipitor, fenofibrate,Vascepa2 mg twice daily 12. History of cocaine use remote tobacco abuse. Urine drug screen negative 13. GERD. Protonix 14.?  AKI on CKD  Creatinine 1.91 on 1/19, labs ordered for tomorrow  Encourage fluids  LOS: 2 days A FACE TO FACE EVALUATION WAS PERFORMED  Nyesha Cliff Lorie Phenix 07/02/2020, 10:38 AM

## 2020-07-02 NOTE — Progress Notes (Signed)
Speech Language Pathology Daily Session Note  Patient Details  Name: Erica [name] MRN: [id number] Date of Birth: Oct 25, 1955  Today's Date: 07/02/2020 SLP Individual Time: 1340-1430 SLP Individual Time Calculation (min): 50 min  Short Term Goals: Week 1: SLP Short Term Goal 1 (Week 1): Pt will increase functional recall of novel information with use of trained compensatory strategies with 80% accuracy SLP Short Term Goal 2 (Week 1): Pt will complete complex problem solving tasks to increase safety at current and d/c environment with Supervision A SLP Short Term Goal 3 (Week 1): Pt will increase executive function skills (planning, organizing) to 90% accuracy with Supervision A SLP Short Term Goal 4 (Week 1): Pt will increase sustained attention to >15 minutes with Supervision A  Skilled Therapeutic Interventions:   Patient seen to address cognitive-linguistic goals with SLP completing portions of Cognistat. 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 Patient was in average range for Attention with repeating sequences of numbers, average range for Orientation, mild-mod impairment for reasoning and severe impairment for recall. She was not able to recall any of the 4 words after 2 minutes or 3 new words after 1 minute. She acted as though she had no recollection of SLP even telling her words (or the repeated review). Patient would respond bluntly at times when asked about PLOF and would give vague, general responses when asked about daily events related to her therapies, goals, etc. Patient reported that she was sleepy and "the gabepentan makes me sleepy". At times, patient appeared to be feigning sleepiness, as she would close her eyes, but quickly alert when food service tech entered room to take her meal order. Patient continues to benefit from skilled SLP intervention prior to discharge to maximize  cognitive-linguistic and speech function.  Pain Pain Assessment Pain Scale: 0-10 Pain Score: 0-No pain  Therapy/Group: Individual Therapy  Sonia Baller, MA, CCC-SLP Speech Therapy

## 2020-07-02 NOTE — Progress Notes (Signed)
Pt. Refused Buspar@ 6AM; writer educated Pt on drug and its use, but Pt. states it isn't working for her and she doesn't want to take it.

## 2020-07-03 ENCOUNTER — Inpatient Hospital Stay (HOSPITAL_COMMUNITY): Payer: Medicare HMO

## 2020-07-03 ENCOUNTER — Inpatient Hospital Stay (HOSPITAL_COMMUNITY): Payer: Medicare HMO | Admitting: Occupational Therapy

## 2020-07-03 ENCOUNTER — Inpatient Hospital Stay (HOSPITAL_COMMUNITY): Payer: Medicare HMO | Admitting: Speech Pathology

## 2020-07-03 DIAGNOSIS — D649 Anemia, unspecified: Secondary | ICD-10-CM

## 2020-07-03 LAB — BASIC METABOLIC PANEL
Anion gap: 9 (ref 5–15)
BUN: 35 mg/dL — ABNORMAL HIGH (ref 8–23)
CO2: 22 mmol/L (ref 22–32)
Calcium: 9.1 mg/dL (ref 8.9–10.3)
Chloride: 111 mmol/L (ref 98–111)
Creatinine, Ser: 1.87 mg/dL — ABNORMAL HIGH (ref 0.44–1.00)
GFR, Estimated: 29 mL/min — ABNORMAL LOW (ref >60)
Glucose, Bld: 145 mg/dL — ABNORMAL HIGH (ref 70–99)
Potassium: 4 mmol/L (ref 3.5–5.1)
Sodium: 142 mmol/L (ref 135–145)

## 2020-07-03 LAB — GLUCOSE, CAPILLARY
Glucose-Capillary: 115 mg/dL — ABNORMAL HIGH (ref 70–99)
Glucose-Capillary: 102 mg/dL — ABNORMAL HIGH (ref 70–99)
Glucose-Capillary: 140 mg/dL — ABNORMAL HIGH (ref 70–99)
Glucose-Capillary: 290 mg/dL — ABNORMAL HIGH (ref 70–99)

## 2020-07-03 NOTE — Progress Notes (Signed)
Speech Language Pathology Daily Session Note  Patient Details  Name: Erica [name] MRN: [id number] Date of Birth: Sep 23, 1955  Today's Date: 07/03/2020 SLP Individual Time: 1300-1400 SLP Individual Time Calculation (min): 60 min  Short Term Goals: Week 1: SLP Short Term Goal 1 (Week 1): Pt will increase functional recall of novel information with use of trained compensatory strategies with 80% accuracy SLP Short Term Goal 2 (Week 1): Pt will complete complex problem solving tasks to increase safety at current and d/c environment with Supervision A SLP Short Term Goal 3 (Week 1): Pt will increase executive function skills (planning, organizing) to 90% accuracy with Supervision A SLP Short Term Goal 4 (Week 1): Pt will increase sustained attention to >15 minutes with Supervision A  Skilled Therapeutic Interventions:   Patient seen for skilled ST session focusing on cognitive function. Patient told SLP "Im awake today" (she was sleepy yesterday and said it was from [facility] medication). Patient was agreeable to transferring from recliner to [facility]. She ambulated by [facility] to speech therapy office, with minA initially for hand position when pushing wheels. She participated in cognitive tasks of counting money and making change, reading and interpreting medication labels, and reading comprehension at sentence or short paragraph level. Patient was 90% accurate for making change and counting money. She was 70% accurate for answering basic level reading comprehension questions. She had significant difficulty with solving daily math problems (calculating tip, calculating total amount, etc) but she did report this would have been hard for her before. Patient continues to benefit from skilled SLP intervention to maximize her cognitive-linguistic abilities prior to discharge.  Pain Pain Assessment Pain Scale: 0-10 Pain Score: 0-No pain  Therapy/Group: Individual Therapy  Sonia Baller, MA, CCC-SLP Speech  Therapy

## 2020-07-03 NOTE — Progress Notes (Signed)
Occupational Therapy Session Note  Patient Details  Name: KIARIA QUINNELL MRN: [id number] Date of Birth: Jun 26, 1955  Today's Date: 07/03/2020 OT Individual Time: 9323-5573 OT Individual Time Calculation (min): 40 min   Short Term Goals: Week 1:  OT Short Term Goal 1 (Week 1): Pt will complete toilet transfer with supervision. OT Short Term Goal 2 (Week 1): Pt will complete UB/LB bathing with supervision. OT Short Term Goal 3 (Week 1): Pt will complete LB dressing with supervision. OT Short Term Goal 4 (Week 1): Pt will complete shower transfer with supervision.  Skilled Therapeutic Interventions/Progress Updates:    Pt greeted while ambulating in the room with NT. OT provided close supervision for remainder of transfer to toilet, pt using the RW. Close supervision for toileting tasks with pt having continent bladder void. Min cuing for terminal knee extension on the Lt side while standing to wash hands and complete oral care in front of the sink. Once she returned to the recliner, worked on [employer] while guided through theraputty strengthening activities (OT providing pt with putty during session). At end of session pt remained sitting up in the recliner, left with all needs within reach and chair alarm set.  Therapy Documentation Precautions:  Precautions Precautions: Fall Restrictions Weight Bearing Restrictions: No Vital Signs: Oxygen Therapy SpO2: 98 % O2 Device: Room Air Pain: Pain Assessment Pain Scale: 0-10 Pain Score: 0-No pain ADL: ADL Lower Body Dressing: Contact guard Where Assessed-Lower Body Dressing: Edge of bed Toileting: Contact guard Where Assessed-Toileting: Bedside Commode Toilet Transfer: Minimal assistance Toilet Transfer Method: Ambulating     Therapy/Group: Individual Therapy  Dontavian Marchi A Mariella Blackwelder 07/03/2020, 12:35 PM

## 2020-07-03 NOTE — Progress Notes (Signed)
[place] PHYSICAL MEDICINE & REHABILITATION PROGRESS NOTE  Subjective/Complaints: Patient seen sitting up in her chair, working with therapies this AM.  She states she slept well overnight.  She continue to have numbness.   ROS: +Left sided numbness, unchanged. Denies CP, SOB, N/V/D  Objective: Vital Signs: Blood pressure (!) 144/82, pulse 68, temperature 98.4 F (36.9 C), resp. rate 18, height _0  (1.6 m), weight 86.6 kg, SpO2 98 %. No results found. Recent Labs    07/01/20 0705  WBC 3.8*  HGB 11.0*  HCT 31.6*  PLT 194   Recent Labs    07/01/20 0705 07/03/20 0529  NA 141 142  K 3.8 4.0  CL 111 111  CO2 23 22  GLUCOSE 75 145*  BUN 27* 35*  CREATININE 1.91* 1.87*  CALCIUM 9.0 9.1   No intake or output data in the 24 hours ending 07/03/20 1029      Physical Exam: BP (!) 144/82 (BP Location: Right Arm)   Pulse 68   Temp 98.4 F (36.9 C)   Resp 18   Ht _1  (1.6 m)   Wt 86.6 kg   SpO2 98%   BMI 33.82 kg/m  Constitutional: No distress . Vital signs reviewed. Obese.  HENT: Normocephalic.  Atraumatic. Eyes: EOMI. No discharge. Cardiovascular: No JVD.  RRR. Respiratory: Normal effort.  No stridor.  Bilateral clear to auscultation. GI: Non-distended.  BS +. Skin: Warm and dry.  Intact. Psych: Normal mood.  Normal behavior. Musc: No edema in extremities.  No tenderness in extremities. Neuro: Alert Motor: RUE/RLE: 5/5 proximal distally in LUE: 4+/5 proximal distal, unchanged LLE: Hip flexion, knee extension 4+/5, ankle dorsiflexion 4/5 with apraxia, unchanged Left-sided ataxia  Assessment/Plan: 1. Functional deficits which require 3+ hours per day of interdisciplinary therapy in a comprehensive inpatient rehab setting.  Physiatrist is providing close team supervision and 24 hour management of active medical problems listed below.  Physiatrist and rehab team continue to assess barriers to discharge/monitor patient progress toward functional and medical  goals   Care Tool:  Bathing              Bathing assist Assist Level: Minimal Assistance - Patient > 75%     Upper Body Dressing/Undressing Upper body dressing        Upper body assist Assist Level: Minimal Assistance - Patient > 75%    Lower Body Dressing/Undressing Lower body dressing      What is the patient wearing?: Pants     Lower body assist Assist for lower body dressing: Minimal Assistance - Patient > 75%     Toileting Toileting    Toileting assist Assist for toileting: Contact Guard/Touching assist     Transfers Chair/bed transfer  Transfers assist     Chair/bed transfer assist level: Contact Guard/Touching assist     Locomotion Ambulation   Ambulation assist      Assist level: Contact Guard/Touching assist Assistive device: Walker-rolling Max distance: 120'   Walk 10 feet activity   Assist     Assist level: Contact Guard/Touching assist Assistive device: Walker-rolling   Walk 50 feet activity   Assist    Assist level: Contact Guard/Touching assist Assistive device: Walker-rolling    Walk 150 feet activity   Assist    Assist level: Contact Guard/Touching assist Assistive device: Walker-rolling    Walk 10 feet on uneven surface  activity   Assist Walk 10 feet on uneven surfaces activity did not occur: Safety/medical concerns  Wheelchair     Assist Will patient use wheelchair at discharge?: No             Wheelchair 50 feet with 2 turns activity    Assist            Wheelchair 150 feet activity     Assist           Medical Problem List and Plan: 1. 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   Continue CIR 2. Antithrombotics: -DVT/anticoagulation:Lovenox -antiplatelet therapy: Aspirin 81 mg daily and Plavix 75 mg daily x3 weeks then Plavix alone 3. Pain  Management:Neurontin 300 mg 3 times daily  Controlled with meds on 1/21  Monitor with increased mobility 4. Mood:Lexapro 10 mg daily, BuSpar 10 mg daily, Vistaril 25 mg twice daily -antipsychotic agents: N/A 5. Neuropsych: This patientiscapable of making decisions on herown behalf. 6. Skin/Wound Care:Routine skin checks 7. Fluids/Electrolytes/Nutrition:Routine in and outs 8. Hypertension. Norvasc 10 mg daily, Toprol-XL 50 mg daily  Slightly labile on 1/21, monitor for trend  Monitor with increased mobility. 9. Diabetes mellitus with peripheral neuropathy. Hemoglobin A1c 5.2.   Lantus insulin 25 units twice daily, decreased to 23 BID on 1/21  Plan to initiate Novolog to stabilize CBGs after accommodated to decreased insulin  Monitor with increased mobility  10. COPD/asthmaPirfenidone801 mg 3 times daily, Ellipta inhaler 1 puff daily, Dulera 2 puffs twice daily -Pirfenidone makes her nauseas, sometimes have diarrhea---treat supportively as she wants to stay on medication 11.  Hyperlipidemia: Lipitor, fenofibrate,Vascepa2 mg twice daily 12. History of cocaine use remote tobacco abuse. Urine drug screen negative 13. GERD. Protonix 14.?  AKI on CKD  Creatinine 1.87 on 1/21, labs ordered for Monday  Encourage fluids 15, ?Pancytopenia  Labs ordered for Monday  LOS: 3 days A FACE TO FACE EVALUATION WAS PERFORMED  Norvell Caswell Lorie Phenix 07/03/2020, 10:29 AM

## 2020-07-03 NOTE — Progress Notes (Signed)
Physical Therapy Session Note  Patient Details  Name: Erica [name] MRN: [id number] Date of Birth: 1955/08/29  Today's Date: 07/03/2020 PT Individual Time: 0900-0945 PT Individual Time Calculation (min): 45 min   Short Term Goals: Week 1:  PT Short Term Goal 1 (Week 1): Pt will complete bed mobility with supervision PT Short Term Goal 2 (Week 1): pt will complete bed<>chair transfers with supervision and LRAD PT Short Term Goal 3 (Week 1): Pt will ambulate 174f with CGA and LRAD PT Short Term Goal 4 (Week 1): Pt will navigate up/down at least 4 steps with minA and LRAD  Skilled Therapeutic Interventions/Progress Updates:    Pt presents up in recliner and agreeable to therapy session. Session focused on functional mobility and NMR to address LLE motor control, balance, and coordination. During gait training with RW with focus on increasing L heel strike/L hip flexion activation, stance time on L and stability in L knee with overall CGA to occasional min assist when decreased foot clearance occurs with cues for upright posture as well x 120' and then x 50' requiring seated rest break due to muscle fatigue and increased effort of breathing. Stair negotiation training with step to pattern with cues for which foot to lead with and decreased stability in L knee requiring occasional blocking assistance with min assist x 4 steps using bilateral rails. Reports she has single rail at home so progressed to practice with single rail sideways and performed this with min assist x 4 steps with difficulty with placement of LLE during descending to allow enough space for RLE to also fit. Sit <> stands with cues for technique to push up from w/c instead of pulling on RW and then standing NMR for heel raises and toe raises x 10 reps each. Returned to room and performed toileting with overall CGA for transfers and functional dynamic standing balance using grab bar for support and able to manage clothing and hygiene  independently. Returned to recliner end of session with CGA and cues for safe technique without AD.   Therapy Documentation Precautions:  Precautions Precautions: Fall Restrictions Weight Bearing Restrictions: No   Pain: Denies pain.    Therapy/Group: Individual Therapy  GCanary BrimBIvory Broad PT, DPT, CBIS  07/03/2020, 12:07 PM

## 2020-07-03 NOTE — Progress Notes (Signed)
Pt. refused Buspar, because she says it does'nt work for her. Will leave a note for Dr.

## 2020-07-03 NOTE — Progress Notes (Signed)
Physical Therapy Session Note  Patient Details  Name: Erica [name] MRN: [id number] Date of Birth: 05-May-1956  Today's Date: 07/03/2020 PT Individual Time: 4235-3614 + 4315-4008 PT Individual Time Calculation (min): 32 min  + 40 min  Short Term Goals: Week 1:  PT Short Term Goal 1 (Week 1): Pt will complete bed mobility with supervision PT Short Term Goal 2 (Week 1): pt will complete bed<>chair transfers with supervision and LRAD PT Short Term Goal 3 (Week 1): Pt will ambulate 180f with CGA and LRAD PT Short Term Goal 4 (Week 1): Pt will navigate up/down at least 4 steps with minA and LRAD  Skilled Therapeutic Interventions/Progress Updates:     1st session: Pt greeted supine in bed, sleeping but awakens easily to voice, agreeable to therapy. No reports of pain. Supine<>sit with supervision with bed features, able to sit EOB with SBA. A few minutes needed for upright acclimation. Pt requesting to brush teeth and get ready for the day.  Sit<>stand with CGA from EOB to RW, cues for hand placement and forward weight shift. Ambulated a few feet to the sink with close supervision and RW. While standing with supervision, she brushed her teeth and washed her face with supervision. Encouraged incorporating LUE as much as possible to regain functional strength. Stand>sit with supervision to w/c with cues for controlled lowering and hand placement.  Provided her tennis shoes and she donned these with setupA while seated in w/c, increased time needed but able to complete (including tieing them). Used figure-4 technique without cues.  W/c transport from her room to dayroom gym for time management. Gait training with CGA and RW while weaving in/out of cones that were spaced ~323fapart, focusing on RW management, navigating environment, and safety awareness. Cues required for increasing L foot clearance, sequencing of weaving technique, and progressing from step-to to a step-through gait. Then she performed  standing toe taps to cone height with RW and supervision, focusing on L hip flexion and L foot coordination, x10 reps.   Returned to her room in her w/c with totalA for energy conservation. Ambulated within her room with close supervision and RW to the recliner where she required mod cues for safety approach and RW management within tight spaces. She remained seated in recliner with chair alarm on and her needs within reach.  2nd session: Pt received sitting in recliner, awake and agreeable to therapy. Daughter at bedside. Brief conversation with daughter regarding DC planning and barriers to DC. Daughter voices concern for pt's safety with the 14 steps to enter apartment as well as her son's ability to provide assist. Daughter requesting HHA's for assisting. Reached out to CSW for guidance. Sit<>stand with supervision from recliner height to RW with cues for hand placement and ambulated within her room with close supervision and RW. Continues to show decreased L foot clearance and frequent L toe drag but she is able to correct with cues. W/c transport for time management to main therapy gym.  Focused remainder of session on stair training and gait training. Wheeled to the practice stairs and therapist demonstrated side=stepping technique with 1 handrail on L. Encouraged her to focus on foot placement in order to provide her with adequate space for her next foot as she steps up and down. She navigated up/down 2x7 steps with minA via side-stepping with R foot leading ascent and L foot leading descent. Required frequent cues for technique and proper sequencing and x2 instances of L knee hyperextension with stance.  Gait training x164f with close supervision and RW. Continues to demo poor L foot clearance but this fluctuates and worsens with fatigue at end of gait distance. Cues throughout for normalizing gait pattern. Pt reporting fatigue from busy day of therapies.  W/c transport back to her room with  totalA for time management. Ambulated within her room with close supervision and RW. Required cues for safety approach to recliner as she abandons walker prematurely. She remained seated in recliner with needs in reach and chair alarm on.  Therapy Documentation Precautions:  Precautions Precautions: Fall Restrictions Weight Bearing Restrictions: No  Therapy/Group: Individual Therapy  Erica [name] P [name] PT 07/03/2020, 7:50 AM

## 2020-07-04 ENCOUNTER — Encounter (HOSPITAL_COMMUNITY): Payer: Medicare HMO | Admitting: Occupational Therapy

## 2020-07-04 ENCOUNTER — Inpatient Hospital Stay (HOSPITAL_COMMUNITY): Payer: Medicare HMO | Admitting: Physical Therapy

## 2020-07-04 ENCOUNTER — Inpatient Hospital Stay (HOSPITAL_COMMUNITY): Payer: Medicare HMO | Admitting: Occupational Therapy

## 2020-07-04 LAB — GLUCOSE, CAPILLARY
Glucose-Capillary: 100 mg/dL — ABNORMAL HIGH (ref 70–99)
Glucose-Capillary: 102 mg/dL — ABNORMAL HIGH (ref 70–99)
Glucose-Capillary: 146 mg/dL — ABNORMAL HIGH (ref 70–99)
Glucose-Capillary: 171 mg/dL — ABNORMAL HIGH (ref 70–99)

## 2020-07-04 MED ORDER — FAMOTIDINE 20 MG PO TABS
20.0000 mg | ORAL_TABLET | Freq: Every day | ORAL | Status: DC
  Administered 2020-07-04 – 2020-07-15 (×12): 20 mg via ORAL
  Filled 2020-07-04 (×12): qty 1

## 2020-07-04 NOTE — Progress Notes (Signed)
Erica [name] PHYSICAL MEDICINE & REHABILITATION PROGRESS NOTE  Subjective/Complaints:   Pt emphatic we can give her meds to 'fix" numbness on L face/lip- explained unable to fix it with meds- sometimes it doesn't resolve, sometimes it does.   Also c/o reflux S'xs- on PPI 40 mg daily- will add Pepcid 20 mg daily- due to renal issues, not BID.   ROS: - L sided numbess- upset about this- "you have to change it".  Pt denies SOB, abd pain, CP, N/V/C/D, and vision changes   Objective: Vital Signs: Blood pressure (!) 155/78, pulse 67, temperature 97.8 F (36.6 C), resp. rate 18, height _0  (1.6 m), weight 86.6 kg, SpO2 100 %. No results found. No results for input(s): WBC, HGB, HCT, PLT in the last 72 hours. Recent Labs    07/03/20 0529  NA 142  K 4.0  CL 111  CO2 22  GLUCOSE 145*  BUN 35*  CREATININE 1.87*  CALCIUM 9.1    Intake/Output Summary (Last 24 hours) at 07/04/2020 1530 Last data filed at 07/04/2020 1300 Gross per 24 hour  Intake 600 ml  Output --  Net 600 ml        Physical Exam: BP (!) 155/78   Pulse 67   Temp 97.8 F (36.6 C)   Resp 18   Ht _1  (1.6 m)   Wt 86.6 kg   SpO2 100%   BMI 33.82 kg/m  Constitutional: No distress . Vital signs reviewed. Obese. Sitting up in w/c at bedside, hair in pigtails, NAD HENT: Normocephalic.  Atraumatic. Numbness L face/L lip Eyes: EOMI. No discharge. Cardiovascular: RRR Respiratory: CTA B/L- no W/R/R- good air movement GI: Soft, NT, ND, (+)BS  Skin: Warm and dry.  Intact. Psych: irritable over numbness Musc: No edema in extremities.  No tenderness in extremities. Neuro: Alert Motor: RUE/RLE: 5/5 proximal distally in LUE: 4+/5 proximal distal, unchanged LLE: Hip flexion, knee extension 4+/5, ankle dorsiflexion 4/5 with apraxia, unchanged Left-sided ataxia  Assessment/Plan: 1. Functional deficits which require 3+ hours per day of interdisciplinary therapy in a comprehensive inpatient rehab  setting.  Physiatrist is providing close team supervision and 24 hour management of active medical problems listed below.  Physiatrist and rehab team continue to assess barriers to discharge/monitor patient progress toward functional and medical goals   Care Tool:  Bathing              Bathing assist Assist Level: Minimal Assistance - Patient > 75%     Upper Body Dressing/Undressing Upper body dressing        Upper body assist Assist Level: Minimal Assistance - Patient > 75%    Lower Body Dressing/Undressing Lower body dressing      What is the patient wearing?: Pants     Lower body assist Assist for lower body dressing: Minimal Assistance - Patient > 75%     Toileting Toileting    Toileting assist Assist for toileting: Supervision/Verbal cueing     Transfers Chair/bed transfer  Transfers assist     Chair/bed transfer assist level: Contact Guard/Touching assist     Locomotion Ambulation   Ambulation assist      Assist level: Contact Guard/Touching assist Assistive device: Walker-rolling Max distance: 120'   Walk 10 feet activity   Assist     Assist level: Contact Guard/Touching assist Assistive device: Walker-rolling   Walk 50 feet activity   Assist    Assist level: Contact Guard/Touching assist Assistive device: Walker-rolling    Walk 150 feet  activity   Assist    Assist level: Contact Guard/Touching assist Assistive device: Walker-rolling    Walk 10 feet on uneven surface  activity   Assist Walk 10 feet on uneven surfaces activity did not occur: Safety/medical concerns         Wheelchair     Assist Will patient use wheelchair at discharge?: No             Wheelchair 50 feet with 2 turns activity    Assist            Wheelchair 150 feet activity     Assist           Medical Problem List and Plan: 1. 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   Continue CIR 2. Antithrombotics: -DVT/anticoagulation:Lovenox -antiplatelet therapy: Aspirin 81 mg daily and Plavix 75 mg daily x3 weeks then Plavix alone 3. Pain Management:Neurontin 300 mg 3 times daily  Controlled with meds on 1/21  1/22- upset about not treating numbness of face- explained there's no meds that fix it.   Monitor with increased mobility 4. Mood:Lexapro 10 mg daily, BuSpar 10 mg daily, Vistaril 25 mg twice daily -antipsychotic agents: N/A 5. Neuropsych: This patientiscapable of making decisions on herown behalf. 6. Skin/Wound Care:Routine skin checks 7. Fluids/Electrolytes/Nutrition:Routine in and outs 8. Hypertension. Norvasc 10 mg daily, Toprol-XL 50 mg daily  Slightly labile on 1/21, monitor for trend  1/22- BP 155/78- if stil up tomorrow, will increase meds  Monitor with increased mobility. 9. Diabetes mellitus with peripheral neuropathy. Hemoglobin A1c 5.2.   Lantus insulin 25 units twice daily, decreased to 23 BID on 1/21  Plan to initiate Novolog to stabilize CBGs after accommodated to decreased insulin  1/22- wait to add for now  Monitor with increased mobility  10. COPD/asthmaPirfenidone801 mg 3 times daily, Ellipta inhaler 1 puff daily, Dulera 2 puffs twice daily -Pirfenidone makes her nauseas, sometimes have diarrhea---treat supportively as she wants to stay on medication 11.  Hyperlipidemia: Lipitor, fenofibrate,Vascepa2 mg twice daily 12. History of cocaine use remote tobacco abuse. Urine drug screen negative 13. GERD. Protonix  1/22- add Pepcid 20 mg daily per pharmacy dosing- since still having GERD Sx's.  14.?  AKI on CKD  Creatinine 1.87 on 1/21, labs ordered for Monday  Encourage fluids 15, ?Pancytopenia  Labs ordered for Monday  LOS: 4 days A FACE TO FACE EVALUATION WAS PERFORMED  [name] 07/04/2020, 3:30 PM

## 2020-07-04 NOTE — Progress Notes (Signed)
Occupational Therapy Session Note  Patient Details  Name: Erica [name] MRN: [id number] Date of Birth: 1956/02/02  Today's Date: 07/04/2020 OT Group Time: 1100-1155 OT Group Time Calculation (min): 55 min  Skilled Therapeutic Interventions/Progress Updates:    Pt engaged in therapeutic w/c level dance group focusing on patient choice, UE/LE strengthening, salience, activity tolerance, and social participation. Pt was guided through various dance-based exercises involving UEs/LEs and trunk. All music was selected by group members. Emphasis placed on Lt NMR and activity tolerance. Pt declined standing though was provided with opportunities to do so. Min encouragement provided to increase involvement of the Lt side, pt participating well and initiating rest breaks as needed. At end of session, RN returned pt to her room.      Therapy Documentation Precautions:  Precautions Precautions: Fall Restrictions Weight Bearing Restrictions: No Pain: no s/s pain during tx   ADL: ADL Lower Body Dressing: Contact guard Where Assessed-Lower Body Dressing: Edge of bed Toileting: Contact guard Where Assessed-Toileting: Bedside Commode Toilet Transfer: Minimal assistance Toilet Transfer Method: Ambulating      Therapy/Group: Group Therapy  Rona Tomson A Conlin Brahm 07/04/2020, 12:29 PM

## 2020-07-04 NOTE — Progress Notes (Signed)
Slept good. Continent BM this AM. Coughing spell after meds given. Patient reports having coughing spells PTA. HS CBG 290, patient said she ate a peach cobbler with supper. Erica [name] A

## 2020-07-04 NOTE — Progress Notes (Signed)
Physical Therapy Session Note  Patient Details  Name: Erica [name] MRN: [id number] Date of Birth: [birth year]  Today's Date: 07/04/2020 PT Individual Time: [phone] and 1950-9326 PT Individual Time Calculation (min): 58 min and 47 min  Short Term Goals: Week 1:  PT Short Term Goal 1 (Week 1): Pt will complete bed mobility with supervision PT Short Term Goal 2 (Week 1): pt will complete bed<>chair transfers with supervision and LRAD PT Short Term Goal 3 (Week 1): Pt will ambulate 122f with CGA and LRAD PT Short Term Goal 4 (Week 1): Pt will navigate up/down at least 4 steps with minA and LRAD  Skilled Therapeutic Interventions/Progress Updates:    Session 1: Pt received supine in bed and agreeable to therapy session. Reports having diarrhea this AM due to a medication for her lungs. Supine>sitting R EOB, HOB flat but using bedrail, with supervision and increased time. Sitting EOB donned tennis shoes with set-up assist but otherwise completed without help. Sit>stand EOB>RW cuing for proper UE placement and CGA for steadying. Gait ~166fx2 in/out bathroom using RW, CGA for steadying, cuing for increased upright trunk posture and safe AD management over bathroom door threshold. Standing with CGA for safety performed LB clothing management without assist. Sit<>stand to/from [facility]sing grab bar and RW with CGA/close supervision - continent of BM with more diarrhea, seated peri-care without assist. Standing hand hygiene at sink with CGA for safety. Pt reports she feels "depressed" stating she "just gets this way some times" - therapist provided emotional support and pt declines wanting to discuss it further. Transported to/from gym in w/c for time management and energy conservation. Gait training 10040fsing RW with CGA/min assist for balance - demos excessive trunk/hip flexion, decreased B LE step lengths (R more than L due to decreased L stance time), decreased L LE foot clearance - cuing for improvement  and for reciprocal stepping pattern. Repeated sit<>stands to/from EOM, no UE support, with CGA/min assist for balance and max cuing for increased upright posture via increased glute activation and hip extension while maintaining L quad activation. Pt reporting becoming fatigued. Standing L LE NMR targeting stance control via repeated R LE foot taps on/off 4" step, L UE support around therapist's shoulders, min assist for balance and max cuing with mirror feedback to maintain glute activation for hip extension and upright posture, guarding L knee but no buckling. Gait training ~100f33fing RW with CGA/min assist for balance and continued cuing as above with some improvements noted. Transported back to room and left sitting in w/c with needs in reach and seat belt alarm on. MD entering for morning assessment.  Session 2: Pt received sitting in recliner with her sister, Chleo, present and pt agreeable to therapy session though reporting fatigue. Pt's sister joined session. Donned shoes with set-up assist. Sit<>stands using RW with CGA for steadying throughout session - continues to require cuing for proper UE placement when using AD with poor recall from earlier session. Ambulatory transfer recliner>w/c ~12ft18fng RW with CGA for steadying and continued cuing for reciprocal stepping pattern. Pt's sister reports she is currently in the process of finding pt a new place to live and arranging pt with more support up D/C - therapist recommended she speak with BeckyJacqlyn Larsenon Monday to further discuss. Transported to/from gym in w/c for time management and energy conservation. Pt requests to get a snack from vending machine. Gait training ~50ft 45fo/from vending machine using RW with CGA/min assist for steadying/balance - continued  cuing for increased hip extension for improved upright posture to promote reciprocal stepping pattern with increased L LE stance time and increased R LE step length - cuing for increased L LE  foot clearance during swing (intermittently catches toes) - improving gait compared to AM session. Standing at vending machine with varying UE support on RW and CGA for steadying able to insert money and select item appropriate for heart health carb modified diet. Stair navigation training for L LE NMR - 8 steps using B HRs - reciprocal stepping pattern on ascent and step-to pattern leading with LLE on descent - min assist for lifting/balance - cuing for increased L LE glute/quad activation when stepping up - on 1st step down pt had L knee hyperextension therefore therapist guarded/blocked for remainder of steps. Standing L LE NMR via repeated R LE forward/backwards stepping over hockey stick with L UE support on therapist's shoulders - heavy min assist for balance and cuing/facilitation for increased L LE glute activation, guarding knee but no buckling noted. Stand pivot w/c<>EOM, B HHA, with min assist for balance and max cuing for upright posture and stepping sequencing. Transported back to room. Gait training ~65f back to recliner using RW with continued cuing for reciprocal stepping pattern. Pt left seated in recliner with needs in reach, chair alarm on, and her sister present.  Therapy Documentation Precautions:  Precautions Precautions: Fall Restrictions Weight Bearing Restrictions: No  Pain:   Session 1: Denies pain during session.  Session 2: No reports of pain throughout session.  Therapy/Group: Individual Therapy  CTawana Scale, PT, DPT, CSRS  07/04/2020, 8:03 AM

## 2020-07-04 NOTE — Progress Notes (Signed)
Occupational Therapy Session Note  Patient Details  Name: Erica [name] MRN: [id number] Date of Birth: 12/11/1955  Today's Date: 07/04/2020 OT Individual Time: 1406-1506 OT Individual Time Calculation (min): 60 min    Short Term Goals: Week 1:  OT Short Term Goal 1 (Week 1): Pt will complete toilet transfer with supervision. OT Short Term Goal 2 (Week 1): Pt will complete UB/LB bathing with supervision. OT Short Term Goal 3 (Week 1): Pt will complete LB dressing with supervision. OT Short Term Goal 4 (Week 1): Pt will complete shower transfer with supervision.  Skilled Therapeutic Interventions/Progress Updates:    Pt received in bed sleeping but awoke fairly easily. Pt agreeable to getting washed up and changing clothing. Her sister arrived at this point.  She declines a shower as she is afraid she will be cold but agreed to washing up in bathroom. She did need to toilet.  Using RW, pt stood and ambulated to bathroom with CGA.  In standing for clothing management and to wash her bottom, pt only needed occasional CGA.  She utilized her left side well with all tasks.  Pt requested to sit at sink to brush teeth and shave her chin. Pt did well with the razor and shaving cream demonstrating safe coordination. For LUE, pt has decreased sh flexion AROM to 140 vs 180 on her R. Had pt work with dowel bar and clasped hands for self assisted ROM.  Pt did extremely well achieving full ROM. Encouraged her to work on the clasped hands exercise over the weekend.    Sister discussed the 14 steps pt needs to go up at home and hoping she can find another apartment.  Pt resting in wc with sister by her side waiting for her next therapy appointment.   Therapy Documentation Precautions:  Precautions Precautions: Fall Restrictions Weight Bearing Restrictions: No  Pain: no c/o pain     Therapy/Group: Individual Therapy  [place] 07/04/2020, 4:23 PM

## 2020-07-05 LAB — GLUCOSE, CAPILLARY
Glucose-Capillary: 102 mg/dL — ABNORMAL HIGH (ref 70–99)
Glucose-Capillary: 68 mg/dL — ABNORMAL LOW (ref 70–99)
Glucose-Capillary: 77 mg/dL (ref 70–99)
Glucose-Capillary: 188 mg/dL — ABNORMAL HIGH (ref 70–99)
Glucose-Capillary: 245 mg/dL — ABNORMAL HIGH (ref 70–99)

## 2020-07-05 MED ORDER — ESCITALOPRAM OXALATE 10 MG PO TABS
20.0000 mg | ORAL_TABLET | Freq: Every day | ORAL | Status: DC
  Administered 2020-07-06 – 2020-07-15 (×10): 20 mg via ORAL
  Filled 2020-07-05 (×10): qty 2

## 2020-07-05 MED ORDER — INSULIN GLARGINE 100 UNIT/ML ~~LOC~~ SOLN
23.0000 [IU] | Freq: Two times a day (BID) | SUBCUTANEOUS | Status: DC
  Administered 2020-07-05 – 2020-07-07 (×4): 23 [IU] via SUBCUTANEOUS
  Filled 2020-07-05 (×5): qty 0.23

## 2020-07-05 MED ORDER — LOPERAMIDE HCL 2 MG PO CAPS
2.0000 mg | ORAL_CAPSULE | ORAL | Status: DC | PRN
  Administered 2020-07-06 – 2020-07-14 (×5): 2 mg via ORAL
  Filled 2020-07-05 (×5): qty 1

## 2020-07-05 NOTE — Progress Notes (Signed)
[place] PHYSICAL MEDICINE & REHABILITATION PROGRESS NOTE  Subjective/Complaints:   Pt reports needs to see psychiatry due to severe depression- very depressed.  Said reflux Sx's better.  Bowels working too well- 3-4 loose BMs/day due to Pirfenidone for pulm fibrosis-  Wants ot make loose stools better.   Also, had low BG of 68 this AM.    ROS: - L sided numbess  Pt denies SOB, abd pain, CP, N/V/C/D, and vision changes   Objective: Vital Signs: Blood pressure (!) 141/84, pulse 65, temperature 97.8 F (36.6 C), resp. rate 18, height _0  (1.6 m), weight 86.6 kg, SpO2 95 %. No results found. No results for input(s): WBC, HGB, HCT, PLT in the last 72 hours. Recent Labs    07/03/20 0529  NA 142  K 4.0  CL 111  CO2 22  GLUCOSE 145*  BUN 35*  CREATININE 1.87*  CALCIUM 9.1    Intake/Output Summary (Last 24 hours) at 07/05/2020 1527 Last data filed at 07/05/2020 1300 Gross per 24 hour  Intake 960 ml  Output --  Net 960 ml        Physical Exam: BP (!) 141/84 (BP Location: Left Arm)   Pulse 65   Temp 97.8 F (36.6 C)   Resp 18   Ht _1  (1.6 m)   Wt 86.6 kg   SpO2 95%   BMI 33.82 kg/m  Constitutional: depressed affect, tearful, sitting up edge of bed, appropriate, NAD HENT: Normocephalic.  Atraumatic. Numbness L face/L lip Eyes: EOMI. No discharge. Cardiovascular: RRR Respiratory: CTA B/L- no W/R/R- decreased slightly at bases GI: Soft, NT, ND, (+)BS hyperactive Skin: Warm and dry.  Intact. Psych: depressed Musc: No edema in extremities.  No tenderness in extremities. Neuro: Alert Motor: RUE/RLE: 5/5 proximal distally in LUE: 4+/5 proximal distal, unchanged LLE: Hip flexion, knee extension 4+/5, ankle dorsiflexion 4/5 with apraxia, unchanged Left-sided ataxia  Assessment/Plan: 1. Functional deficits which require 3+ hours per day of interdisciplinary therapy in a comprehensive inpatient rehab setting.  Physiatrist is providing close team supervision  and 24 hour management of active medical problems listed below.  Physiatrist and rehab team continue to assess barriers to discharge/monitor patient progress toward functional and medical goals   Care Tool:  Bathing    Body parts bathed by patient: Right arm,Left arm,Chest,Abdomen,Front perineal area,Buttocks,Right upper leg,Left upper leg,Right lower leg,Left lower leg,Face         Bathing assist Assist Level: Contact Guard/Touching assist     Upper Body Dressing/Undressing Upper body dressing   What is the patient wearing?: Pull over shirt    Upper body assist Assist Level: Supervision/Verbal cueing    Lower Body Dressing/Undressing Lower body dressing      What is the patient wearing?: Pants,Underwear/pull up     Lower body assist Assist for lower body dressing: Minimal Assistance - Patient > 75%     Toileting Toileting    Toileting assist Assist for toileting: Contact Guard/Touching assist     Transfers Chair/bed transfer  Transfers assist     Chair/bed transfer assist level: Contact Guard/Touching assist Chair/bed transfer assistive device: Programmer, multimedia   Ambulation assist      Assist level: Contact Guard/Touching assist Assistive device: Walker-rolling Max distance: 128f   Walk 10 feet activity   Assist     Assist level: Contact Guard/Touching assist Assistive device: Walker-rolling   Walk 50 feet activity   Assist    Assist level: Contact Guard/Touching assist Assistive device:  Walker-rolling    Walk 150 feet activity   Assist    Assist level: Contact Guard/Touching assist Assistive device: Walker-rolling    Walk 10 feet on uneven surface  activity   Assist Walk 10 feet on uneven surfaces activity did not occur: Safety/medical concerns         Wheelchair     Assist Will patient use wheelchair at discharge?: No             Wheelchair 50 feet with 2 turns activity    Assist             Wheelchair 150 feet activity     Assist           Medical Problem List and Plan: 1. 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   Continue CIR 2. Antithrombotics: -DVT/anticoagulation:Lovenox -antiplatelet therapy: Aspirin 81 mg daily and Plavix 75 mg daily x3 weeks then Plavix alone 3. Pain Management:Neurontin 300 mg 3 times daily  Controlled with meds on 1/21  1/22- upset about not treating numbness of face- explained there's no meds that fix it.   Monitor with increased mobility 4. Mood:Lexapro 10 mg daily, BuSpar 10 mg daily, Vistaril 25 mg twice daily  1/23- will increase Lexapro to 20 mg daily due to severe depression and ask Neuropsych to see her.  -antipsychotic agents: N/A 5. Neuropsych: This patientiscapable of making decisions on herown behalf. 6. Skin/Wound Care:Routine skin checks 7. Fluids/Electrolytes/Nutrition:Routine in and outs 8. Hypertension. Norvasc 10 mg daily, Toprol-XL 50 mg daily  Slightly labile on 1/21, monitor for trend  1/22- BP 155/78- if stil up tomorrow, will increase meds  1/23- BP better- con't regimen  Monitor with increased mobility. 9. Diabetes mellitus with peripheral neuropathy. Hemoglobin A1c 5.2.   Lantus insulin 25 units twice daily, decreased to 23 BID on 1/21  Plan to initiate Novolog to stabilize CBGs after accommodated to decreased insulin  11/23- BG of 68 this Am- will decrease Lantus to 23 units BID  Monitor with increased mobility  10. COPD/asthmaPirfenidone801 mg 3 times daily, Ellipta inhaler 1 puff daily, Dulera 2 puffs twice daily -Pirfenidone makes her nauseas, sometimes have diarrhea---treat supportively as she wants to stay on medication 11.  Hyperlipidemia: Lipitor, fenofibrate,Vascepa2 mg twice daily 12. History of cocaine use remote tobacco abuse. Urine drug screen  negative 13. GERD. Protonix  1/22- add Pepcid 20 mg daily per pharmacy dosing- since still having GERD Sx's.  14.?  AKI on CKD  Creatinine 1.87 on 1/21, labs ordered for Monday  Encourage fluids 15, ?Pancytopenia  Labs ordered for Monday 16. Loose stools  1/23- due to Pirfenone for pulm fibrosis- will try imodium prn if bad- having 3-4 loose stools/day    LOS: 5 days A FACE TO FACE EVALUATION WAS PERFORMED  Erica [name] 07/05/2020, 3:27 PM

## 2020-07-05 NOTE — Significant Event (Signed)
Hypoglycemic Event  CBG: 68  Treatment: 4 oz juice/soda  Symptoms: None  Follow-up CBG: Time 0627 CBG Result:102  Possible Reasons for Event: Unknown  Comments/MD notified:treated per protocol    Erica [name] A

## 2020-07-06 ENCOUNTER — Inpatient Hospital Stay (HOSPITAL_COMMUNITY): Payer: Medicare HMO | Admitting: Occupational Therapy

## 2020-07-06 ENCOUNTER — Inpatient Hospital Stay (HOSPITAL_COMMUNITY): Payer: Medicare HMO

## 2020-07-06 LAB — CBC WITH DIFFERENTIAL/PLATELET
Abs Immature Granulocytes: 0.01 10*3/uL (ref 0.00–0.07)
Basophils Absolute: 0 10*3/uL (ref 0.0–0.1)
Basophils Relative: 0 %
Eosinophils Absolute: 0.1 10*3/uL (ref 0.0–0.5)
Eosinophils Relative: 3 %
HCT: 27.7 % — ABNORMAL LOW (ref 36.0–46.0)
Hemoglobin: 10.1 g/dL — ABNORMAL LOW (ref 12.0–15.0)
Immature Granulocytes: 0 %
Lymphocytes Relative: 32 %
Lymphs Abs: 1.1 10*3/uL (ref 0.7–4.0)
MCH: 32.9 pg (ref 26.0–34.0)
MCHC: 36.5 g/dL — ABNORMAL HIGH (ref 30.0–36.0)
MCV: 90.2 fL (ref 80.0–100.0)
Monocytes Absolute: 0.3 10*3/uL (ref 0.1–1.0)
Monocytes Relative: 7 %
Neutro Abs: 2 10*3/uL (ref 1.7–7.7)
Neutrophils Relative %: 58 %
Platelets: 163 10*3/uL (ref 150–400)
RBC: 3.07 MIL/uL — ABNORMAL LOW (ref 3.87–5.11)
RDW: 16.4 % — ABNORMAL HIGH (ref 11.5–15.5)
WBC: 3.5 10*3/uL — ABNORMAL LOW (ref 4.0–10.5)
nRBC: 0 % (ref 0.0–0.2)

## 2020-07-06 LAB — BASIC METABOLIC PANEL WITH GFR
Anion gap: 8 (ref 5–15)
BUN: 30 mg/dL — ABNORMAL HIGH (ref 8–23)
CO2: 19 mmol/L — ABNORMAL LOW (ref 22–32)
Calcium: 8.8 mg/dL — ABNORMAL LOW (ref 8.9–10.3)
Chloride: 111 mmol/L (ref 98–111)
Creatinine, Ser: 1.71 mg/dL — ABNORMAL HIGH (ref 0.44–1.00)
GFR, Estimated: 33 mL/min — ABNORMAL LOW
Glucose, Bld: 136 mg/dL — ABNORMAL HIGH (ref 70–99)
Potassium: 3.8 mmol/L (ref 3.5–5.1)
Sodium: 138 mmol/L (ref 135–145)

## 2020-07-06 LAB — GLUCOSE, CAPILLARY
Glucose-Capillary: 127 mg/dL — ABNORMAL HIGH (ref 70–99)
Glucose-Capillary: 84 mg/dL (ref 70–99)
Glucose-Capillary: 212 mg/dL — ABNORMAL HIGH (ref 70–99)
Glucose-Capillary: 147 mg/dL — ABNORMAL HIGH (ref 70–99)

## 2020-07-06 NOTE — Progress Notes (Signed)
Patient ID: Erica [name], female   DOB: August 31, 1955, 65 y.o.   MRN: [id number] Spoke with [name]-sister and pt regarding her housing situation. She was in the process of moving and the apartment is not ready but [facility] and she will be in motel, until [address] apartment is ready and inspected. Sister feels she was having issues with taking her medications prior to CVA. She is concerned about her care. Made aware can't stay here and will need to make alternative arrangements.

## 2020-07-06 NOTE — Progress Notes (Signed)
Physical Therapy Session Note  Patient Details  Name: Erica [name] MRN: [id number] Date of Birth: 09-Oct-1955  Today's Date: 07/06/2020 PT Individual Time: 0800-0857 PT Individual Time Calculation (min): 57 min   Short Term Goals: Week 1:  PT Short Term Goal 1 (Week 1): Pt will complete bed mobility with supervision PT Short Term Goal 2 (Week 1): pt will complete bed<>chair transfers with supervision and LRAD PT Short Term Goal 3 (Week 1): Pt will ambulate 176f with CGA and LRAD PT Short Term Goal 4 (Week 1): Pt will navigate up/down at least 4 steps with minA and LRAD  Skilled Therapeutic Interventions/Progress Updates:    Pt greeted sitting EOB with bed alarm on, agreeable to therapy. No reports of pain. She reports no updates on her moving process in regards to her apartment. She donned shoes with setupA while seated EOB, requiring extra time for tieing them but able to complete herself. Sit<>stand with supervision from low EOB height to RW. Ambulated a few feet to the w/c with supervision and RW with cues for safety approrach and RW management. Stand>sit with supervision with cues for hand placement and controlled lowering. W/c transport to main therapy gym for time management.  Gait training [id number] close supervision and RW. She's showing improved ability to clear her L foot with increased L heel strike, a wider BOS, and now doing a step-through gait rather than step-to. Gait speed also improved. No knee buckling or LOB during gait but mild unsteadiness present. Last ~3039ff gait with worsening gait deficits due to fatigue. Ed on energy conservation and awareness of limitations for safety.   Stair training up.down x8 steps + x4 steps (seated rest) with mostly CGA and 1 hand rail. Step-to pattern with R foot leading ascent and L foot leading descent. x2 instances of L knee hyperextension towards the end of the trial but no formal LOB noted. Increased work of breathing after x8 steps but  recovers quickly with a seated rest. Again, showing improved ability to clear her L foot on the step compared to prior sessions.  Stand<>pivot transfer with CGA and no AD from w/c to mat table. Sit>supine with supervision onto mat table with small wedge under upper back for comfort. Performed the following supine there-ex: -2x10 BLE bridges -2x10 LLE SAQ with 2.5# ankle weight -2x10 LLE knee-to-chest with 2.5# ankle weight -2x10 LLE clam shells *Therapist cueing for sequencing, muscle activation, and dosing throughout  Supine<>sit with supervision on mat table and stand<>Pivot with CGA and no AD back to her w/c. W/c transport back to her room with totalA for time management. Sit<>stand in the room from w/c to RW and she ambulated in-room distances with close supervision and RW to her recliner, demonstrated improved safety approach and safety awareness with tight spaces. She remained seated in recliner with chair alarm on and needs within reach.  Therapy Documentation Precautions:  Precautions Precautions: Fall Restrictions Weight Bearing Restrictions: No  Therapy/Group: Individual Therapy  Erica [name] PT 07/06/2020, 7:34 AM

## 2020-07-06 NOTE — Progress Notes (Signed)
Occupational Therapy Session Note  Patient Details  Name: Erica [name] MRN: [id number] Date of Birth: January 31, 1956  Today's Date: 07/06/2020 OT Individual Time: 8871-9597 OT Individual Time Calculation (min): 72 min    Short Term Goals: Week 1:  OT Short Term Goal 1 (Week 1): Pt will complete toilet transfer with supervision. OT Short Term Goal 2 (Week 1): Pt will complete UB/LB bathing with supervision. OT Short Term Goal 3 (Week 1): Pt will complete LB dressing with supervision. OT Short Term Goal 4 (Week 1): Pt will complete shower transfer with supervision.  Skilled Therapeutic Interventions/Progress Updates:    Patient seated edge of bed, sister present for session.  Patient denies pain, agrees to participate in therapy session.  Started with flat affect but with increased engagement at end of session.  Ambulation with RW to/from bed, w/c, toilet with CGA.  She completed grooming, upper body bathing and dressing w/c level at sink with CS/set up.  She completed toileting and washed/ changed lower body clothes on toilet surface with min A for left foot and CM in stance.   Completed BITS system activities to address balance, visual scanning and coordination: Trial 1:  2 min in stance, dots with right hand = 1.59 sec (no significant difference right vs left side of screen) Trial 2:  2 min in stance, dots with left hand = 2.04 sec Trial 3:  Saccades, in stance, A-Z = 1:24, reaction 3.25 sec Completed FMC with beads - minimal difficulty Ambulation with RW to recliner at close of session with CS/CGA, seat alarm set and call bell in hand.     Therapy Documentation Precautions:  Precautions Precautions: Fall Restrictions Weight Bearing Restrictions: No  Therapy/Group: Individual Therapy  Carlos Levering 07/06/2020, 7:46 AM

## 2020-07-06 NOTE — Progress Notes (Addendum)
[place] PHYSICAL MEDICINE & REHABILITATION PROGRESS NOTE  Subjective/Complaints: Patient seen ambulating from restroom to bed with rolling walker this morning.  She states she slept well overnight.  She states she had a good weekend.  She has questions regarding numbness and improvement again.  ROS: +L sided numbess, baseline shortness of breath, unchanged.  Denies abd pain, CP, N/V/C/D   Objective: Vital Signs: Blood pressure 136/82, pulse 75, temperature 98.3 F (36.8 C), resp. rate 18, height _0  (1.6 m), weight 86.6 kg, SpO2 97 %. No results found. Recent Labs    07/06/20 0514  WBC 3.5*  HGB 10.1*  HCT 27.7*  PLT 163   Recent Labs    07/06/20 0514  NA 138  K 3.8  CL 111  CO2 19*  GLUCOSE 136*  BUN 30*  CREATININE 1.71*  CALCIUM 8.8*    Intake/Output Summary (Last 24 hours) at 07/06/2020 1315 Last data filed at 07/06/2020 0700 Gross per 24 hour  Intake 720 ml  Output --  Net 720 ml        Physical Exam: BP 136/82   Pulse 75   Temp 98.3 F (36.8 C)   Resp 18   Ht _1  (1.6 m)   Wt 86.6 kg   SpO2 97%   BMI 33.82 kg/m  Constitutional: No distress . Vital signs reviewed. HENT: Normocephalic.  Atraumatic. Eyes: EOMI. No discharge. Cardiovascular: No JVD.  RRR. Respiratory: Normal effort.  No stridor.  Bilateral clear to auscultation. GI: Non-distended.  BS +. Skin: Warm and dry.  Intact. Psych: Normal mood.  Normal behavior. Musc: No edema in extremities.  No tenderness in extremities. Neuro: Alert Motor: RUE/RLE: 5/5 proximal distally in LUE: 4+/5 proximal distal, unchanged LLE: Hip flexion, knee extension 4+/5, ankle dorsiflexion 4+/5 with apraxia Left-sided ataxia and numbness  Assessment/Plan: 1. Functional deficits which require 3+ hours per day of interdisciplinary therapy in a comprehensive inpatient rehab setting.  Physiatrist is providing close team supervision and 24 hour management of active medical problems listed  below.  Physiatrist and rehab team continue to assess barriers to discharge/monitor patient progress toward functional and medical goals   Care Tool:  Bathing    Body parts bathed by patient: Right arm,Left arm,Chest,Abdomen,Front perineal area,Buttocks,Right upper leg,Left upper leg,Right lower leg,Left lower leg,Face         Bathing assist Assist Level: Contact Guard/Touching assist     Upper Body Dressing/Undressing Upper body dressing   What is the patient wearing?: Pull over shirt    Upper body assist Assist Level: Supervision/Verbal cueing    Lower Body Dressing/Undressing Lower body dressing      What is the patient wearing?: Pants,Underwear/pull up     Lower body assist Assist for lower body dressing: Minimal Assistance - Patient > 75%     Toileting Toileting    Toileting assist Assist for toileting: Contact Guard/Touching assist     Transfers Chair/bed transfer  Transfers assist     Chair/bed transfer assist level: Supervision/Verbal cueing Chair/bed transfer assistive device: Programmer, multimedia   Ambulation assist      Assist level: Supervision/Verbal cueing Assistive device: Walker-rolling Max distance: 177f   Walk 10 feet activity   Assist     Assist level: Supervision/Verbal cueing Assistive device: Walker-rolling   Walk 50 feet activity   Assist    Assist level: Supervision/Verbal cueing Assistive device: Walker-rolling    Walk 150 feet activity   Assist    Assist level: Supervision/Verbal cueing  Assistive device: Walker-rolling    Walk 10 feet on uneven surface  activity   Assist Walk 10 feet on uneven surfaces activity did not occur: Safety/medical concerns         Wheelchair     Assist Will patient use wheelchair at discharge?: No             Wheelchair 50 feet with 2 turns activity    Assist            Wheelchair 150 feet activity     Assist            Medical Problem List and Plan: 1. 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   Continue CIR 2. Antithrombotics: -DVT/anticoagulation:Lovenox -antiplatelet therapy: Aspirin 81 mg daily and Plavix 75 mg daily x3 weeks then Plavix alone 3. Pain Management:Neurontin 300 mg 3 times daily  Controlled with meds on 1/24  Monitor with increased mobility 4. Mood:Lexapro 10 mg daily, BuSpar 10 mg daily, Vistaril 25 mg twice daily  Increased Lexapro to 20 mg daily due to severe depression and ask Neuropsych to see her.  -antipsychotic agents: N/A 5. Neuropsych: This patientiscapable of making decisions on herown behalf. 6. Skin/Wound Care:Routine skin checks 7. Fluids/Electrolytes/Nutrition:Routine in and outs 8. Hypertension. Norvasc 10 mg daily, Toprol-XL 50 mg daily  Slightly labile on 1/24, monitor for trend  Monitor with increased mobility. 9. Diabetes mellitus with peripheral neuropathy. Hemoglobin A1c 5.2.   Lantus insulin 25 units twice daily, decreased to 23 BID on 1/23  Plan to initiate Novolog to stabilize CBGs after accommodated to decreased insulin  Labile on 1/24, monitor trend  Monitor with increased mobility  10. COPD/asthmaPirfenidone801 mg 3 times daily, Ellipta inhaler 1 puff daily, Dulera 2 puffs twice daily -Pirfenidone makes her nauseas, sometimes have diarrhea---treat supportively as she wants to stay on medication 11.  Hyperlipidemia: Lipitor, fenofibrate,Vascepa2 mg twice daily 12. History of cocaine use remote tobacco abuse. Urine drug screen negative 13. GERD. Protonix  1/22- add Pepcid 20 mg daily per pharmacy dosing- since still having GERD Sx's.  14.?  AKI on CKD  Creatinine 1.71 on 1/24  Encourage fluids 15, Pancytopenia  No obvious offending medications  Continue to trend down, labs ordered for tomorrow 16. Loose  stools  1/23- due to Pirfenone for pulm fibrosis-trial imodium prn if bad- having 3-4 loose stools/day  LOS: 6 days A FACE TO FACE EVALUATION WAS PERFORMED  Cheyne Boulden Lorie Phenix 07/06/2020, 1:15 PM

## 2020-07-07 ENCOUNTER — Inpatient Hospital Stay (HOSPITAL_COMMUNITY): Payer: Medicare HMO

## 2020-07-07 ENCOUNTER — Inpatient Hospital Stay (HOSPITAL_COMMUNITY): Payer: Medicare HMO | Admitting: Occupational Therapy

## 2020-07-07 DIAGNOSIS — N1832 Chronic kidney disease, stage 3b: Secondary | ICD-10-CM

## 2020-07-07 LAB — CREATININE, SERUM
Creatinine, Ser: 1.8 mg/dL — ABNORMAL HIGH (ref 0.44–1.00)
GFR, Estimated: 31 mL/min — ABNORMAL LOW (ref >60)

## 2020-07-07 LAB — GLUCOSE, CAPILLARY
Glucose-Capillary: 171 mg/dL — ABNORMAL HIGH (ref 70–99)
Glucose-Capillary: 56 mg/dL — ABNORMAL LOW (ref 70–99)
Glucose-Capillary: 99 mg/dL (ref 70–99)
Glucose-Capillary: 105 mg/dL — ABNORMAL HIGH (ref 70–99)
Glucose-Capillary: 212 mg/dL — ABNORMAL HIGH (ref 70–99)

## 2020-07-07 MED ORDER — INSULIN GLARGINE 100 UNIT/ML ~~LOC~~ SOLN
20.0000 [IU] | Freq: Two times a day (BID) | SUBCUTANEOUS | Status: DC
  Administered 2020-07-07 – 2020-07-10 (×6): 20 [IU] via SUBCUTANEOUS
  Filled 2020-07-07 (×7): qty 0.2

## 2020-07-07 NOTE — Progress Notes (Signed)
Speech Language Pathology Daily Session Note  Patient Details  Name: DAKIYAH HEINKE MRN: [id number] Date of Birth: [birth year]  Today's Date: 07/07/2020 SLP Individual Time: 3662-9476 SLP Individual Time Calculation (min): 30 min  Short Term Goals: Week 1: SLP Short Term Goal 1 (Week 1): Pt will increase functional recall of novel information with use of trained compensatory strategies with 80% accuracy SLP Short Term Goal 2 (Week 1): Pt will complete complex problem solving tasks to increase safety at current and d/c environment with Supervision A SLP Short Term Goal 3 (Week 1): Pt will increase executive function skills (planning, organizing) to 90% accuracy with Supervision A SLP Short Term Goal 4 (Week 1): Pt will increase sustained attention to >15 minutes with Supervision A  Skilled Therapeutic Interventions:Skilled ST services focused on cognitive skills. Pt's sister was present for the initial 5 minutes of treatment session and confirmed she will be the primary providing supervision, however has spoke to [facility] about requesting home health service for night time supervision, especially taking nighttime medications. Son lives with pt, but family expressed him not being a Architectural technologist. Pt expressed lethargy and requested to not participate in ST services, howe after ST provided education pertaining to a pattern of refusal demonstrating non-participation pt agreed to participate. Pt did expressed mixed feelings of depression along with fatigue. Pt stated " It the gabapention makes me feel this way" and supported same effect occurred at home while consuming medication. SLP facilitated continued medication management with x4 pill organizer. Pt demonstrated verbal problem solving skills in x3 a day medication, however demonstrated difficulty carrying over functional task in organizer requiring max A verbal cues further impacted by lethargy (intermittmently closing eyes.) Pt requested to end task due  to lethargy, pt missed 15 minutes of treatment.  Pt was left in room with call bell within reach and bed alarm set. SLP recommends to continue skilled services.     Pain Pain Assessment Pain Score: 0-No pain  Therapy/Group: Individual Therapy  Deandria Klute  [facility] 07/07/2020, 2:44 PM

## 2020-07-07 NOTE — Progress Notes (Signed)
Occupational Therapy Session Note  Patient Details  Name: Erica [name] MRN: [id number] Date of Birth: 1955-08-04  Today's Date: 07/07/2020 OT Individual Time: 0930-1000 OT Individual Time Calculation (min): 30 min    Short Term Goals: Week 1:  OT Short Term Goal 1 (Week 1): Pt will complete toilet transfer with supervision. OT Short Term Goal 2 (Week 1): Pt will complete UB/LB bathing with supervision. OT Short Term Goal 3 (Week 1): Pt will complete LB dressing with supervision. OT Short Term Goal 4 (Week 1): Pt will complete shower transfer with supervision.  Skilled Therapeutic Interventions/Progress Updates:    Pt sleeping in bed upon arrival but easily aroused and agreeable to sitting EOB for LUE [facility] tasks. Pt presented with yellow theraputty with 10 small beads embedded. Pt tasked with removing all beads using Lt hand only. Pt completed task with minimal difficulty, primarily with manipulating small beads. Pt retrieved beads from table and placed back into theraputty. Pt completed various strengthing and [place] tasks with theraputty before returning to container. Pt returned to supine with supervision. Pt remained in bed with all needs within reach and bed alarm activated.  Therapy Documentation Precautions:  Precautions Precautions: Fall Restrictions Weight Bearing Restrictions: No Pain: Pain Assessment Pain Scale: 0-10 Pain Score: 0-No pain   Therapy/Group: Individual Therapy  Leroy Libman 07/07/2020, 10:03 AM

## 2020-07-07 NOTE — Progress Notes (Signed)
Occupational Therapy Session Note  Patient Details  Name: Erica [name] MRN: [id number] Date of Birth: [birth year]  Today's Date: 07/07/2020 OT Individual Time: 1107-1200 OT Individual Time Calculation (min): 53 min    Short Term Goals: Week 1:  OT Short Term Goal 1 (Week 1): Pt will complete toilet transfer with supervision. OT Short Term Goal 2 (Week 1): Pt will complete UB/LB bathing with supervision. OT Short Term Goal 3 (Week 1): Pt will complete LB dressing with supervision. OT Short Term Goal 4 (Week 1): Pt will complete shower transfer with supervision.  Skilled Therapeutic Interventions/Progress Updates:    Pt just finishing up with toileting with nurse tech present.  Pts sister present for session reporting initially desire to be cleared to take pt to bathroom with her assist.  OT assessed pts sister assisting pt to ambulate from EOB to bathroom and complete toilet transfer.  Pts sister assisted pt with independence and good safety awareness therefore cleared to assist in future.  Pt requesting to shower this session.  Pt completed tub bench transfer at walk in shower level using grab bars with CGA.  Pt completed all UB dressing including doffing/donn bra and shirt overhead with supervision sitting at tub bench.  Pt completed all LB dressing including doff/donning underwear and pants with CGA and increased time.  Pt bathed UB and LB at overall supervision level using grab bars during sit<>stand portion.  Pt educated on safety technique of drying floor surfaces and self completely prior to ambulating to bedroom.  Pt stood at sink and brushed teeth with supervision.  Pt ambulated to other side of bed and stand to sit at recliner with close supervision. Call bell in reach, seat alarm on.  Therapy Documentation Precautions:  Precautions Precautions: Fall Restrictions Weight Bearing Restrictions: No   Therapy/Group: Individual Therapy  Ezekiel Slocumb 07/07/2020, 3:25 PM

## 2020-07-07 NOTE — Progress Notes (Signed)
Hypoglycemic Event  CBG: 56  Treatment: 8 oz juice/soda  Symptoms: None  Follow-up CBG: [id number] CBG Result:171  Possible Reasons for Event: Unknown  Comments/MD notified:treated per protocol    Erica [name]

## 2020-07-07 NOTE — Progress Notes (Addendum)
Speech Language Pathology Daily Session Note  Patient Details  Name: Erica [name] MRN: [id number] Date of Birth: 03-13-1956  Today's Date: 07/06/2020 SLP Individual Time:  1030-1130    Short Term Goals: Week 1: SLP Short Term Goal 1 (Week 1): Pt will increase functional recall of novel information with use of trained compensatory strategies with 80% accuracy SLP Short Term Goal 2 (Week 1): Pt will complete complex problem solving tasks to increase safety at current and d/c environment with Supervision A SLP Short Term Goal 3 (Week 1): Pt will increase executive function skills (planning, organizing) to 90% accuracy with Supervision A SLP Short Term Goal 4 (Week 1): Pt will increase sustained attention to >15 minutes with Supervision A  Skilled Therapeutic Interventions:SLP focused on cognitive skills. Pt appeared lethargic, eyes closing intermittently but was able to participate in treatment session. Pt was able to confirm medication consumed prior to admission when presented with list. Pt demonstrated recall of medication name/function/times per day with min A fade to supervision A verbal cues for medication list. SLP facilitated mildly complex problem solving and error awareness in x3 a day pill organizer, pt only completed medication consumed x1 a day due to time restrictions mod I, however verbal problem solving of medication consumed x2 and x3 a day required min A verbal cues. SLP will complete the task next treatment session. Pt was left in room with call bell within reach and chair alarm set. Recommend to continue ST services.      Pain    Therapy/Group: Individual Therapy  Shanae Luo  [facility] 07/06/2020, 5:00 PM

## 2020-07-07 NOTE — Progress Notes (Signed)
Physical Therapy Session Note  Patient Details  Name: Erica [name] MRN: [id number] Date of Birth: 1955-12-19  Today's Date: 07/07/2020 PT Individual Time:Session1:0833-0902; [id number]   PT Individual Time Calculation: 29 min & 29 min  Short Term Goals: Week 1:  PT Short Term Goal 1 (Week 1): Pt will complete bed mobility with supervision PT Short Term Goal 2 (Week 1): pt will complete bed<>chair transfers with supervision and LRAD PT Short Term Goal 3 (Week 1): Pt will ambulate 179f with CGA and LRAD PT Short Term Goal 4 (Week 1): Pt will navigate up/down at least 4 steps with minA and LRAD  Skilled Therapeutic Interventions/Progress Updates:    Session1:  Patient in supine reports finished breakfast and feeling okay.  Did have HA last night, but went to sleep without medicating for it.  Performed supine to sit with S.  Donned shoes seated EOB with A to open up laces and increased time.  S for balance with reaching to feet and bringing leg up to her.  Patient requesting to brush teeth and wash face.  Sit to stand to RW with min A and cues for hand placement.  Patient ambulated to sink with RW and min A.  Brushed teeth and washed face standing with intermittent UE support and CGA to close S. Patient seated in w/c and pushed in w/c to dayroom for time management and energy conservation.  Sit to stand to RW CGA and ambulated 80' with RW and min A cues for L LE extension in stance.  Patient standing for taps with R to 4" step with HHA on L and cues for tall posture x 5 reps, then tapping L foot to step x 5.  Patient assisted in w/c to room and into bathroom per pt request.  Stand pivot using grabbar with min A to toilet pt managing to doff pants.  Patient left with call bell in reach and nursing staff aware pt to call when ready.   Session2: Patient in recliner with sister in the room.  Unable to recall events of this morning's session. Sit to stand with min A to RW and ambulated to w/c x 10'  with CGA mod cues for positioning over seat prior to sitting in w/c for safety.  Assisted in w/c to gym.  Patient ambulated 8' with RW and CGA to stairs, negotiated 8 (3") steps with bilateral rails and min A mod cues for sequencing and L foot placement for safety.  Patient mildly SOB and checked SpO2 99% on RA.  Patient seated on mat and initiated Berg balance test as noted below, but unable to complete due to time constraints.  Patient assisted in w/c and back to room.  Ambulated 10' with HHA min to mod A to sit in recliner.  Left with sister in the room and chair alarm active.   Therapy Documentation Precautions:  Precautions Precautions: Fall Restrictions Weight Bearing Restrictions: No Pain: Pain Assessment Pain Score: 0-No pain  Balance: Standardized Balance Assessment Standardized Balance Assessment: Berg Balance Test Berg Balance Test Sit to Stand: Needs minimal aid to stand or to stabilize Standing Unsupported: Able to stand 2 minutes with supervision Sitting with Back Unsupported but Feet Supported on Floor or Stool: Able to sit safely and securely 2 minutes Stand to Sit: Controls descent by using hands Transfers: Needs one person to assist Standing Unsupported with Eyes Closed: Able to stand 10 seconds with supervision From Standing, Reach Forward with Outstretched Arm: Can reach forward >12 cm  safely (5") From Standing Position, Pick up Object from Floor: Unable to try/needs assist to keep balance From Standing Position, Turn to Look Behind Over each Shoulder: Turn sideways only but maintains balance   Therapy/Group: Individual Therapy  Reginia Naas  Progress, [place] 07/07/2020, 5:29 PM

## 2020-07-07 NOTE — Progress Notes (Signed)
[place] PHYSICAL MEDICINE & REHABILITATION PROGRESS NOTE  Subjective/Complaints: Patient seen sitting up at the edge of his bed.  She states she slept well overnight. Limited engagement.  Good sitting balance noted.   ROS: +Left sided numbess, baseline shortness of breath, stable. Denies abd pain, CP, N/V/C/D   Objective: Vital Signs: Blood pressure (!) 148/73, pulse 68, temperature 98 F (36.7 C), resp. rate 16, height _0  (1.6 m), weight 86.6 kg, SpO2 100 %. No results found. Recent Labs    07/06/20 0514  WBC 3.5*  HGB 10.1*  HCT 27.7*  PLT 163   Recent Labs    07/06/20 0514 07/07/20 0534  NA 138  --   K 3.8  --   CL 111  --   CO2 19*  --   GLUCOSE 136*  --   BUN 30*  --   CREATININE 1.71* 1.80*  CALCIUM 8.8*  --     Intake/Output Summary (Last 24 hours) at 07/07/2020 1302 Last data filed at 07/07/2020 0840 Gross per 24 hour  Intake 720 ml  Output --  Net 720 ml        Physical Exam: BP (!) 148/73 (BP Location: Right Arm)   Pulse 68   Temp 98 F (36.7 C)   Resp 16   Ht _1  (1.6 m)   Wt 86.6 kg   SpO2 100%   BMI 33.82 kg/m  Constitutional: No distress . Vital signs reviewed. HENT: Normocephalic.  Atraumatic. Eyes: EOMI. No discharge. Cardiovascular: No JVD.  RRR. Respiratory: Normal effort.  No stridor.  Bilateral clear to auscultation. GI: Non-distended.  BS +. Skin: Warm and dry.  Intact. Psych: Normal mood.  Normal behavior. Musc: No edema in extremities.  No tenderness in extremities. Neuro: Alert Motor: RUE/RLE: 5/5 proximal distally in LUE: 4+/5 proximal distal, stable LLE: Hip flexion, knee extension 4+/5, ankle dorsiflexion 4+/5 with apraxia, stable Left-sided ataxia and numbness  Assessment/Plan: 1. Functional deficits which require 3+ hours per day of interdisciplinary therapy in a comprehensive inpatient rehab setting.  Physiatrist is providing close team supervision and 24 hour management of active medical problems listed  below.  Physiatrist and rehab team continue to assess barriers to discharge/monitor patient progress toward functional and medical goals   Care Tool:  Bathing    Body parts bathed by patient: Right arm,Left arm,Chest,Abdomen,Front perineal area,Buttocks,Right upper leg,Left upper leg,Face         Bathing assist Assist Level: Supervision/Verbal cueing     Upper Body Dressing/Undressing Upper body dressing   What is the patient wearing?: Bra,Pull over shirt    Upper body assist Assist Level: Supervision/Verbal cueing    Lower Body Dressing/Undressing Lower body dressing      What is the patient wearing?: Underwear/pull up,Pants     Lower body assist Assist for lower body dressing: Minimal Assistance - Patient > 75%     Toileting Toileting    Toileting assist Assist for toileting: Supervision/Verbal cueing     Transfers Chair/bed transfer  Transfers assist     Chair/bed transfer assist level: Supervision/Verbal cueing Chair/bed transfer assistive device: Programmer, multimedia   Ambulation assist      Assist level: Supervision/Verbal cueing Assistive device: Walker-rolling Max distance: 153f   Walk 10 feet activity   Assist     Assist level: Supervision/Verbal cueing Assistive device: Walker-rolling   Walk 50 feet activity   Assist    Assist level: Supervision/Verbal cueing Assistive device: Walker-rolling    Walk  150 feet activity   Assist    Assist level: Supervision/Verbal cueing Assistive device: Walker-rolling    Walk 10 feet on uneven surface  activity   Assist Walk 10 feet on uneven surfaces activity did not occur: Safety/medical concerns         Wheelchair     Assist Will patient use wheelchair at discharge?: No             Wheelchair 50 feet with 2 turns activity    Assist            Wheelchair 150 feet activity     Assist           Medical Problem List and Plan: 1.  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   Continue CIR 2. Antithrombotics: -DVT/anticoagulation:Lovenox -antiplatelet therapy: Aspirin 81 mg daily and Plavix 75 mg daily x3 weeks then Plavix alone 3. Pain Management:Neurontin 300 mg 3 times daily  Controlled with meds on 1/25  Monitor with increased mobility 4. Mood:Lexapro 10 mg daily, BuSpar 10 mg daily, Vistaril 25 mg twice daily  Increased Lexapro to 20 mg daily due to severe depression and ask Neuropsych to see her.  -antipsychotic agents: N/A 5. Neuropsych: This patientiscapable of making decisions on herown behalf. 6. Skin/Wound Care:Routine skin checks 7. Fluids/Electrolytes/Nutrition:Routine in and outs 8. Hypertension. Norvasc 10 mg daily, Toprol-XL 50 mg daily  Slightly elevated on 1/25, will consider medication increase if persistent  Monitor with increased mobility. 9. Diabetes mellitus with peripheral neuropathy. Hemoglobin A1c 5.2.   Lantus insulin 25 units twice daily, decreased to 23 BID on 1/23, decreased to 20 BID on 1/25  Plan to initiate Novolog to stabilize CBGs after accommodated to decreased insulin  Monitor with increased mobility  10. COPD/asthmaPirfenidone801 mg 3 times daily, Ellipta inhaler 1 puff daily, Dulera 2 puffs twice daily -Pirfenidone makes her nauseas, sometimes have diarrhea---treat supportively as she wants to stay on medication 11.  Hyperlipidemia: Lipitor, fenofibrate,Vascepa2 mg twice daily 12. History of cocaine use remote tobacco abuse. Urine drug screen negative 13. GERD. Protonix  1/22- add Pepcid 20 mg daily per pharmacy dosing- since still having GERD Sx's.  14.?  AKI on CKD  Creatinine 1.80 on 1/24  Encourage fluids 15, Pancytopenia  No obvious offending medications  Continue to trend down, labs ordered for tomorrow 16. Loose stools  1/23-  due to Pirfenone for pulm fibrosis-trial imodium prn if bad- having 3-4 loose stools/day  LOS: 7 days A FACE TO FACE EVALUATION WAS PERFORMED  Asar Evilsizer Lorie Phenix 07/07/2020, 1:02 PM

## 2020-07-08 ENCOUNTER — Inpatient Hospital Stay (HOSPITAL_COMMUNITY): Payer: Medicare HMO

## 2020-07-08 ENCOUNTER — Inpatient Hospital Stay (HOSPITAL_COMMUNITY): Payer: Medicare HMO | Admitting: Occupational Therapy

## 2020-07-08 DIAGNOSIS — D61818 Other pancytopenia: Secondary | ICD-10-CM

## 2020-07-08 DIAGNOSIS — I10 Essential (primary) hypertension: Secondary | ICD-10-CM

## 2020-07-08 LAB — CBC WITH DIFFERENTIAL/PLATELET
Abs Immature Granulocytes: 0.01 10*3/uL (ref 0.00–0.07)
Basophils Absolute: 0 10*3/uL (ref 0.0–0.1)
Basophils Relative: 0 %
Eosinophils Absolute: 0.1 10*3/uL (ref 0.0–0.5)
Eosinophils Relative: 2 %
HCT: 27.9 % — ABNORMAL LOW (ref 36.0–46.0)
Hemoglobin: 10 g/dL — ABNORMAL LOW (ref 12.0–15.0)
Immature Granulocytes: 0 %
Lymphocytes Relative: 29 %
Lymphs Abs: 1 10*3/uL (ref 0.7–4.0)
MCH: 32.3 pg (ref 26.0–34.0)
MCHC: 35.8 g/dL (ref 30.0–36.0)
MCV: 90 fL (ref 80.0–100.0)
Monocytes Absolute: 0.3 10*3/uL (ref 0.1–1.0)
Monocytes Relative: 7 %
Neutro Abs: 2.1 10*3/uL (ref 1.7–7.7)
Neutrophils Relative %: 62 %
Platelets: 142 10*3/uL — ABNORMAL LOW (ref 150–400)
RBC: 3.1 MIL/uL — ABNORMAL LOW (ref 3.87–5.11)
RDW: 16.4 % — ABNORMAL HIGH (ref 11.5–15.5)
WBC: 3.5 10*3/uL — ABNORMAL LOW (ref 4.0–10.5)
nRBC: 0 % (ref 0.0–0.2)

## 2020-07-08 LAB — GLUCOSE, CAPILLARY
Glucose-Capillary: 153 mg/dL — ABNORMAL HIGH (ref 70–99)
Glucose-Capillary: 193 mg/dL — ABNORMAL HIGH (ref 70–99)
Glucose-Capillary: 184 mg/dL — ABNORMAL HIGH (ref 70–99)
Glucose-Capillary: 164 mg/dL — ABNORMAL HIGH (ref 70–99)

## 2020-07-08 MED ORDER — HYDRALAZINE HCL 10 MG PO TABS
10.0000 mg | ORAL_TABLET | Freq: Three times a day (TID) | ORAL | Status: DC
  Administered 2020-07-08 – 2020-07-09 (×3): 10 mg via ORAL
  Filled 2020-07-08 (×3): qty 1

## 2020-07-08 NOTE — Progress Notes (Signed)
Physical Therapy Session Note  Patient Details  Name: Erica [name] MRN: [id number] Date of Birth: 10/27/1955  Today's Date: 07/08/2020 PT Individual Time: 0900-1000 PT Individual Time Calculation (min): 60 min   Short Term Goals: Week 2:  PT Short Term Goal 1 (Week 2): STG=LTG due to ELOS  Skilled Therapeutic Interventions/Progress Updates:     Patient sitting EOB upon PT arrival. Patient alert and agreeable to PT session. Patient denied pain during session.  Therapeutic Activity: Transfers: Patient performed sit to/from stand x2 with RW x2, with L HR x2, and without AD x2 with CGA. Provided verbal cues for hand placement, reaching back to sit, and scooting forward.  Gait Training:  Patient ambulated >150 feet x2, pt's room<>second door of main gym toward MW, using RW with CGA and w/c follow for safety due to decreased activity tolerance. Ambulated with decreased L stance time, decreased L step height with intermittent toe drag, forward trunk flexion, and downward head gaze. Provided verbal cues for erect posture, looking ahead and visual scanning of environment, increased L hamstring activation in pre-swing and heel strike at initial contact for improved foot clearance, and increased step length on R for improved weight shift and stance time on L.  Patient ascended/descended 8-6" steps using B upper extremities on L rail to simulate home set up with CGA. Performed step-to gait pattern leading with R ascending and L descending. Provided cues for technique and sequencing and blocked L knee in stance on descent due to poor knee control.   Neuromuscular Re-ed: Patient performed the following upper/lower extremity motor control and balance activities: -step-ups with L leg on 6" step with light B upper extremity support focused on hamstring activation to lift foot on/off step and controlled concentric and eccentric quad and glute activation while ascending/descending -boxing activity for dynamic  balance, focused on equal force production from R/L when punching heavy bag with gloves, 2x2 min  Patient sitting EOB with her sister int he room at end of session with breaks locked, bed alarm set, and all needs within reach. Discussed d/c planning with her sister, has concerns about patient being able to return to her apartment. CSW made aware.   Therapy Documentation Precautions:  Precautions Precautions: Fall Restrictions Weight Bearing Restrictions: No   Therapy/Group: Individual Therapy  Zia Kanner L Wilmot Quevedo PT, DPT  07/08/2020, 7:40 PM

## 2020-07-08 NOTE — Progress Notes (Signed)
Occupational Therapy Note  Patient Details  Name: Erica [name] MRN: [id number] Date of Birth: 1956-03-25  Today's Date: 07/08/2020 OT Missed Time: 14 Minutes Missed Time Reason: Patient unwilling/refused to participate without medical reason (pt reports she doesnt feel up to it today.)  Pt sitting up at side of bed with sister present.  Pt reporting feeling tired due to "something else going on" in life, however pt did not wish to elaborate.  Therapist provided social/emotional support through therapeutic use of self and encouraged pt centered treatment, however pt reporting "I just don't feel up to it today".       Erica [name] 07/08/2020, 1:22 PM

## 2020-07-08 NOTE — Progress Notes (Addendum)
Patient ID: Erica [name], female   DOB: 12-31-1955, 65 y.o.   MRN: [id number]  Met with pt and sister-Erica [name] in the room to discuss team conference progress toward her goals of supervision level and discharge date 2/2. Pt has concerns her son will not assist her and wanted to know her options. She is doing too well to for her insurance to cover a NH and sister has offered for her to go to her home in [place] but pt does not want to go to [place]. Sister plans to come daily to see her and do what is needed but will not stay over night there. Pt still needing to check to see when needs to be out of her apartment, wants worker to call [employer] with [employer] who is working on her new apartment. Will call her to see when will be ready. Spoke with her and she reports awaiting for new apartment to be inspected and then would be ready to move in. Pt and family will need to pack up her current apartment.

## 2020-07-08 NOTE — Progress Notes (Signed)
[place] PHYSICAL MEDICINE & REHABILITATION PROGRESS NOTE  Subjective/Complaints: Patient seen sitting up in bed this AM.  Good sitting balance.  She states she slept well overnight.  She has questions regarding discharge.   ROS: +Left sided numbness, baseline shortness of breath, unchanged. Denies abd pain, CP, N/V/C/D   Objective: Vital Signs: Blood pressure (!) 154/76, pulse 66, temperature 97.9 F (36.6 C), temperature source Oral, resp. rate 14, height _0  (1.6 m), weight 86.6 kg, SpO2 99 %. No results found. Recent Labs    07/06/20 0514 07/08/20 0507  WBC 3.5* 3.5*  HGB 10.1* 10.0*  HCT 27.7* 27.9*  PLT 163 142*   Recent Labs    07/06/20 0514 07/07/20 0534  NA 138  --   K 3.8  --   CL 111  --   CO2 19*  --   GLUCOSE 136*  --   BUN 30*  --   CREATININE 1.71* 1.80*  CALCIUM 8.8*  --     Intake/Output Summary (Last 24 hours) at 07/08/2020 1101 Last data filed at 07/08/2020 5697 Gross per 24 hour  Intake 830 ml  Output --  Net 830 ml        Physical Exam: BP (!) 154/76   Pulse 66   Temp 97.9 F (36.6 C) (Oral)   Resp 14   Ht _1  (1.6 m)   Wt 86.6 kg   SpO2 99%   BMI 33.82 kg/m  Constitutional: No distress . Vital signs reviewed. HENT: Normocephalic.  Atraumatic. Eyes: EOMI. No discharge. Cardiovascular: No JVD.  RRR. Respiratory: Normal effort.  No stridor.  Bilateral clear to auscultation. GI: Non-distended.  BS +. Skin: Warm and dry.  Intact. Psych: Normal mood.  Normal behavior. Musc: No edema in extremities.  No tenderness in extremities. Neuro: Alert Motor: RUE/RLE: 5/5 proximal distally in LUE: 4+/5 proximal distal, stable LLE: Hip flexion, knee extension 4+/5, ankle dorsiflexion 4+/5 with apraxia, unchanged Left-sided numbness, unchanged  Assessment/Plan: 1. Functional deficits which require 3+ hours per day of interdisciplinary therapy in a comprehensive inpatient rehab setting.  Physiatrist is providing close team supervision  and 24 hour management of active medical problems listed below.  Physiatrist and rehab team continue to assess barriers to discharge/monitor patient progress toward functional and medical goals   Care Tool:  Bathing    Body parts bathed by patient: Right arm,Left arm,Chest,Abdomen,Front perineal area,Buttocks,Right upper leg,Left upper leg,Face         Bathing assist Assist Level: Supervision/Verbal cueing     Upper Body Dressing/Undressing Upper body dressing   What is the patient wearing?: Bra,Pull over shirt    Upper body assist Assist Level: Supervision/Verbal cueing    Lower Body Dressing/Undressing Lower body dressing      What is the patient wearing?: Underwear/pull up,Pants     Lower body assist Assist for lower body dressing: Contact Guard/Touching assist     Toileting Toileting    Toileting assist Assist for toileting: Supervision/Verbal cueing     Transfers Chair/bed transfer  Transfers assist     Chair/bed transfer assist level: Contact Guard/Touching assist Chair/bed transfer assistive device: Programmer, multimedia   Ambulation assist      Assist level: Contact Guard/Touching assist Assistive device: Walker-rolling Max distance: 80   Walk 10 feet activity   Assist     Assist level: Contact Guard/Touching assist Assistive device: Walker-rolling   Walk 50 feet activity   Assist    Assist level: Contact Guard/Touching assist  Assistive device: Walker-rolling    Walk 150 feet activity   Assist    Assist level: Supervision/Verbal cueing Assistive device: Walker-rolling    Walk 10 feet on uneven surface  activity   Assist Walk 10 feet on uneven surfaces activity did not occur: Safety/medical concerns         Wheelchair     Assist Will patient use wheelchair at discharge?: No             Wheelchair 50 feet with 2 turns activity    Assist            Wheelchair 150 feet activity      Assist           Medical Problem List and Plan: 1. 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   Continue CIR  Team conference today to discuss current and goals and coordination of care, home and environmental barriers, and discharge planning with nursing, case manager, and therapies. Please see conference note from today as well.  2. Antithrombotics: -DVT/anticoagulation:Lovenox -antiplatelet therapy: Aspirin 81 mg daily and Plavix 75 mg daily x3 weeks then Plavix alone 3. Pain Management:Neurontin 300 mg 3 times daily  Controlled with meds on 1/26  Monitor with increased mobility 4. Mood:Lexapro 10 mg daily, BuSpar 10 mg daily, Vistaril 25 mg twice daily  Increased Lexapro to 20 mg daily due to severe depression  Neuropsych eval -antipsychotic agents: N/A 5. Neuropsych: This patientiscapable of making decisions on herown behalf. 6. Skin/Wound Care:Routine skin checks 7. Fluids/Electrolytes/Nutrition:Routine in and outs 8. Hypertension. Norvasc 10 mg daily, Toprol-XL 50 mg daily  Hydralazine 10 TID started on 1/26  Monitor with increased mobility. 9. Diabetes mellitus with peripheral neuropathy. Hemoglobin A1c 5.2.   Lantus insulin 25 units twice daily, decreased to 23 BID on 1/23, decreased to 20 BID on 1/25  Plan to initiate Novolog to stabilize CBGs after accommodated to decreased insulin  Labile and elevated on 1/26  Monitor with increased mobility  10. COPD/asthmaPirfenidone801 mg 3 times daily, Ellipta inhaler 1 puff daily, Dulera 2 puffs twice daily -Pirfenidone makes her nauseas, sometimes have diarrhea---treat supportively as she wants to stay on medication 11.  Hyperlipidemia: Lipitor, fenofibrate,Vascepa2 mg twice daily 12. History of cocaine use remote tobacco abuse. Urine drug screen negative 13. GERD. Protonix  1/22-  add Pepcid 20 mg daily per pharmacy dosing- since still having GERD Sx's.  14.?  AKI on CKD  Creatinine 1.80 on 1/24  Encourage fluids 15, Pancytopenia  No obvious offending medications  Trending down/stable on 1/26- will discuss with pharmacy 16. Loose stools  1/23- due to Pirfenone for pulm fibrosis-trial imodium prn if bad- having 3-4 loose stools/day  LOS: 8 days A FACE TO FACE EVALUATION WAS PERFORMED  Erica [name] Erica [name] 07/08/2020, 11:01 AM

## 2020-07-08 NOTE — Progress Notes (Signed)
Physical Therapy Weekly Progress Note  Patient Details  Name: Erica [name] MRN: [id number] Date of Birth: 1955/08/29  Beginning of progress report period: July 01, 2020 End of progress report period: July 08, 2020  Today's Date: 07/08/2020 PT Individual Time:Session1: 1130-1200; Arthor Captain: 2130-8657 PT Individual Time Calculation (min): 30 min &35 min (missed 25 min)  Patient has met 4 of 4 short term goals.  Patient able to achieve CGA level for ambulation with cues for L foot clearance and for safety backing up to chair as well as for hand placement on w/c needing supervision for transfers.  She is able to negotiate steps with CGA with rail and is on target to meet modI/S level LTG's, though at times is limited by fatigue, awareness, memory and c/o SOB with h/o COPD and feels mask inhibits her.   Patient continues to demonstrate the following deficits muscle weakness, unbalanced muscle activation, decreased coordination and decreased motor planning and decreased attention, decreased problem solving and decreased memory and therefore will continue to benefit from skilled PT intervention to increase functional independence with mobility.  Patient progressing toward long term goals..  Continue plan of care.  PT Short Term Goals Week 1:  PT Short Term Goal 1 (Week 1): Pt will complete bed mobility with supervision PT Short Term Goal 1 - Progress (Week 1): Met PT Short Term Goal 2 (Week 1): pt will complete bed<>chair transfers with supervision and LRAD PT Short Term Goal 3 (Week 1): Pt will ambulate 12f with CGA and LRAD PT Short Term Goal 3 - Progress (Week 1): Met PT Short Term Goal 4 (Week 1): Pt will navigate up/down at least 4 steps with minA and LRAD PT Short Term Goal 4 - Progress (Week 1): Met Week 2:  PT Short Term Goal 1 (Week 2): STG=LTG due to ELOS  Skilled Therapeutic Interventions/Progress Updates:  Ambulation/gait training;Balance/vestibular training;Cognitive  remediation/compensation;Community reintegration;Discharge planning;Disease management/prevention;DME/adaptive equipment instruction;Functional electrical stimulation;Functional mobility training;Neuromuscular re-education;Pain management;Patient/family education;Psychosocial support;Skin care/wound management;Stair training;Splinting/orthotics;Therapeutic Activities;Therapeutic Exercise;Wheelchair propulsion/positioning;UE/LE Coordination activities;UE/LE Strength taining/ROM;Visual/perceptual remediation/compensation   Session1:  Patient seated EOB with sister present in the room.  Reports already had therapy today including practicing steps.  She performed sit to stand with S to RW min cues for hand placement.  Patient ambulated 100' with CGA with RW and w/c follow for safety.  Patient performed standing reaching to L for awareness, L LE stance stability and standing tolerance to reach for horseshoes and toss to staub.  Walked to pick up horseshoes x 3 with RW for support and CGA.  Patient c/o fatigue in standing.  Performed balance in parallel bars on rocker board balance with 1 UE support even weight shift with board in middle x 30 sec, then rocking R/L for weight shift and hip strength.  Patient assisted in w/c to room and ambulated with S x 12' to recliner.  Left with chair alarm active and needs in reach, sister in the room.   Session2:Patient in supine with sister in the room.  Reports not feeling well this pm and having diarrhea today due to medication she takes for lungs with having issues with side effects before.  Agreeable to bed exercises.  Patient performed supine ankle pumps, heel slides, SLR, SAQ w/ 2# weights, sidelying hip abduction with 2#, bridging and seated LAQ w/ 2# al x 10 reps.  Transferred to armchair at bedside CGA for squat pivot.  Attempted standing holding sink for side steps up to 4" step, but pt reports unable  after one attempt.  Stated "I'm done" after LAQ so assisted to stand  and step to [facility] no AD with min A holding bed rail.  Sit to supine with S.  Assist for adjusting hips/shoulders.  Left in supine with call bell/needs in reach and bed alarm active. Missed 25 minutes of skilled PT due to fatigue.   Therapy Documentation Precautions:  Precautions Precautions: Fall Restrictions Weight Bearing Restrictions: No Pain: Pain Assessment Pain Scale: 0-10 Pain Score: 0-No pain  Therapy/Group: Individual Therapy  Reginia Naas  Magda Kiel, PT 07/08/2020, 12:17 PM

## 2020-07-08 NOTE — Patient Care Conference (Signed)
Inpatient RehabilitationTeam Conference and Plan of Care Update Date: 07/08/2020   Time: 10:59 AM    Patient Name: Erica [name]      Medical Record Number: [id number]  Date of Birth: [birth year] Sex: Female         Room/Bed: 4W20C/4W20C-01 Payor Info: Payor: [employer] MEDICARE / Plan: [place] HMO / Product Type: *No Product type* /    Admit Date/Time:  06/30/2020  1:51 PM  Primary Diagnosis:  Right thalamic infarction [facility])  Hospital Problems: Principal Problem:   Right thalamic infarction [facility]) Active Problems:   Diabetic peripheral neuropathy ([place])   Essential hypertension   AKI (acute kidney injury) ([place])   Ataxia, late effect of cerebrovascular disease   Labile blood glucose   Labile blood pressure   Anemia   Stage 3b chronic kidney disease ([place])   Benign essential HTN   Pancytopenia [facility])    Expected Discharge Date: Expected Discharge Date: 07/15/20  Team Members Present: Physician leading conference: Dr. Delice Lesch Care Coodinator Present: Dorien Chihuahua, RN, BSN, CRRN;Becky Dupree, LCSW Nurse Present: Benjie Karvonen, RN PT Present: Magda Kiel, PT OT Present: Leretha Pol, OT SLP Present: Charolett Bumpers, SLP PPS Coordinator present : Gunnar Fusi, SLP     Current Status/Progress Goal Weekly Team Focus  Bowel/Bladder             Swallow/Nutrition/ Hydration             ADL's   close supervision/CGA functional mobility and self care  mod I  self care training, functional transfers and mobility, visual scanning, neuro re-ed/strenthening LUE   Mobility   CGA transfers, gait w/ RW and stairs, L side weakness and occasional knee buckling  mod I transfers, bed mobility, S ambulation, CGA 14 steps  L NMR/strength, awareness, safety, balance, gait and stairs   Communication             Safety/Cognition/ Behavioral Observations  impacted by fatigue, max-supervision A, mainly min A  Mod I problem solving (might downgrade), Supervision A recall and attention   complex problem solving (medication/money), recall, sustained attention and education   Pain             Skin               Discharge Planning:  Home with son who is not dependable and sister coming by from [facility] to assist intermittently. Pt will probably be alone some   Team Discussion: AKI being monitored per MD. BP stable. Continent of bowel and bladder. Progress limited by fatigue, lethargy and left knee weakness and decreased safety awareness.  Patient on target to meet rehab goals: yes, currently supervision overall with supervision- mod I goals  *See Care Plan and progress notes for long and short-term goals.   Revisions to Treatment Plan:  Address mild problem solving issues  Teaching Needs: Transfers, toileting, medications, etc. Cues for safety  Current Barriers to Discharge: Decreased caregiver support, Home enviroment access/layout and Behavior  Possible Resolutions to Barriers: Discussion with sister and patient's son regarding change to discharge to hotel as new apartment is not ready. 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  Barriers to Discharge: Medical stability;Home enviroment access/layout;Decreased family/caregiver support;Other (comments)  Barriers to Discharge Comments: Depression/Lethargy Possible Resolutions to [employer] Focus: Therapies, follow labs - Cr, hematology, optimize DM/HTN meds, pt/family edu,  Neuropsych eval   Continued Need for Acute Rehabilitation Level of Care: The patient requires daily medical management by a physician with specialized training in physical medicine and rehabilitation for the following reasons: Direction of a multidisciplinary physical rehabilitation program to maximize functional independence : Yes Medical management of patient stability for  increased activity during participation in an intensive rehabilitation regime.: Yes Analysis of laboratory values and/or radiology reports with any subsequent need for medication adjustment and/or medical intervention. : Yes   I attest that I was present, lead the team conference, and concur with the assessment and plan of the team.   Dorien Chihuahua B 07/08/2020, 3:46 PM

## 2020-07-09 ENCOUNTER — Inpatient Hospital Stay (HOSPITAL_COMMUNITY): Payer: Medicare HMO | Admitting: Speech Pathology

## 2020-07-09 ENCOUNTER — Inpatient Hospital Stay (HOSPITAL_COMMUNITY): Payer: Medicare HMO

## 2020-07-09 ENCOUNTER — Inpatient Hospital Stay (HOSPITAL_COMMUNITY): Payer: Medicare HMO | Admitting: Occupational Therapy

## 2020-07-09 LAB — CBC WITH DIFFERENTIAL/PLATELET
Abs Immature Granulocytes: 0.02 10*3/uL (ref 0.00–0.07)
Basophils Absolute: 0 10*3/uL (ref 0.0–0.1)
Basophils Relative: 0 %
Eosinophils Absolute: 0.1 10*3/uL (ref 0.0–0.5)
Eosinophils Relative: 2 %
HCT: 27.5 % — ABNORMAL LOW (ref 36.0–46.0)
Hemoglobin: 9.7 g/dL — ABNORMAL LOW (ref 12.0–15.0)
Immature Granulocytes: 1 %
Lymphocytes Relative: 24 %
Lymphs Abs: 0.8 10*3/uL (ref 0.7–4.0)
MCH: 31.7 pg (ref 26.0–34.0)
MCHC: 35.3 g/dL (ref 30.0–36.0)
MCV: 89.9 fL (ref 80.0–100.0)
Monocytes Absolute: 0.2 10*3/uL (ref 0.1–1.0)
Monocytes Relative: 7 %
Neutro Abs: 2.2 10*3/uL (ref 1.7–7.7)
Neutrophils Relative %: 66 %
Platelets: 142 10*3/uL — ABNORMAL LOW (ref 150–400)
RBC: 3.06 MIL/uL — ABNORMAL LOW (ref 3.87–5.11)
RDW: 16.6 % — ABNORMAL HIGH (ref 11.5–15.5)
WBC: 3.4 10*3/uL — ABNORMAL LOW (ref 4.0–10.5)
nRBC: 0 % (ref 0.0–0.2)

## 2020-07-09 LAB — GLUCOSE, CAPILLARY
Glucose-Capillary: 74 mg/dL (ref 70–99)
Glucose-Capillary: 161 mg/dL — ABNORMAL HIGH (ref 70–99)
Glucose-Capillary: 123 mg/dL — ABNORMAL HIGH (ref 70–99)
Glucose-Capillary: 264 mg/dL — ABNORMAL HIGH (ref 70–99)

## 2020-07-09 MED ORDER — HYDRALAZINE HCL 25 MG PO TABS
25.0000 mg | ORAL_TABLET | Freq: Three times a day (TID) | ORAL | Status: DC
  Administered 2020-07-09 – 2020-07-13 (×12): 25 mg via ORAL
  Filled 2020-07-09 (×12): qty 1

## 2020-07-09 NOTE — Progress Notes (Signed)
Speech Language Pathology Daily Session Note  Patient Details  Name: Erica [name] MRN: [id number] Date of Birth: April 13, 1956  Today's Date: 07/09/2020 SLP Individual Time: 1500-1530 SLP Individual Time Calculation (min): 30 min  Short Term Goals: Week 1: SLP Short Term Goal 1 (Week 1): Pt will increase functional recall of novel information with use of trained compensatory strategies with 80% accuracy SLP Short Term Goal 2 (Week 1): Pt will complete complex problem solving tasks to increase safety at current and d/c environment with Supervision A SLP Short Term Goal 3 (Week 1): Pt will increase executive function skills (planning, organizing) to 90% accuracy with Supervision A SLP Short Term Goal 4 (Week 1): Pt will increase sustained attention to >15 minutes with Supervision A  Skilled Therapeutic Interventions:   Patient seen to address cognitive goals with sister present in room. Patient appeared apathetic and during discussion regarding discharge plans and progress, patient stated "Im going home to a mess". Sister clarified that patient's son has been living with patient but has not been providing any help and seems to cause patient some distress. Patient continues to report that she becomes drowsy with medications(gabepentin). SLP encouraged patient to continue to try to focus on her own recovery. (She has apparently been refusing some therapies recently). Patient continues to benefit from skilled SLP intervention to maximize cognitive function prior to discharge.  Pain Pain Assessment Pain Scale: 0-10 Pain Score: 0-No pain  Therapy/Group: Individual Therapy  Sonia Baller, MA, CCC-SLP Speech Therapy

## 2020-07-09 NOTE — Progress Notes (Signed)
Physical Therapy Session Note  Patient Details  Name: Erica [name] MRN: [id number] Date of Birth: [birth year]  Today's Date: 07/09/2020 PT Individual Time: 4403-4742 + 1300-1410 PT Individual Time Calculation (min): 43 min  + 70 min  Short Term Goals: Week 2:  PT Short Term Goal 1 (Week 2): STG=LTG due to ELOS  Skilled Therapeutic Interventions/Progress Updates:     1st session: Pt greeted sitting EOB, agreeable to therapy and just finished her breakfast. No c/o pain but reports need to void. Sit<>stand with close supervision to RW and she ambulated with close supervision within her room to the bathroom toilet (regular height). She was able to maintain standing with close supervision while she lowered pants and required cues for controlled lowering to low toilet height. Pt continent of bladder, charted. Ambulated to the sink with supervision and RW and patient performed oral care and hand hygiene with supervision while standing. Supervision for transfer to her w/c with RW with cues for safety approach. RN arriving for morning medications and MD arriving for morning rounds. Pt donned tennis shoes with setupA while seated EOB, requiring extra time for completion. W/c transport to main therapy gym for time management. Focused remainder of session on gait training. Ambulated 110f + 1768fwith supervision and RW, gait deficits include decreased L step length, decreased L weight shift, decreased L hip flexion. Cues for normalizing gait pattern and improving RW proximity.   She ended session seated in recliner with chair alarm on and her needs within reach. Updated that her new apartment has passed inspection and she will now need [facility] services to assist with moving her belongings. New apartment is on ground level with no stairs. Pt appears pleased.   2nd session: Pt received sitting upright in recliner, daughter at bedside, pt agreeable (reluctant) to therapy session. No reports of pain. Brief  conversation regarding DC planning with new apartment. Pt unsure of when move will take place but was told that it would occur prior to her DC.  Sit<>stand with distant supervision to RW and she ambulated with distant supervision and RW within her room to the w/c. Stand>sit with supervision with cues for controlled lowering. W/c transport for time management to ortho gym. Completed the BERG balance test as outlned below. She required several seated rest breaks and interruptions due to fatigue in order to complete this test. She also reported need to have a BM during the test. She was able to ambulate to/from bathroom, ~5062fway, with supervision and RW where she was continent of bowel and bladder, charted.  Patient demonstrates increased fall risk as noted by score of   33/56 on Berg Balance Scale.  (<36= high risk for falls, close to 100%; 37-45 significant >80%; 46-51 moderate >50%; 52-55 lower >25%). Educated her on the results of her tests and the high risk falls category this places her. Educated her on always using her RW for functional mobility to reduce falls risk and she voiced understanding and intent to comply.   Ambulated with supervision and RW to Nustep where she required CGA for safely transferring to it. She completed x8 minutes at workload of 5, using both BUE's and BLE's, focusing on LLE hip and knee extension strengthening as well as cardiovascular endurance. Pt with around ~40 steps/minute cadence.   Performed car transfer with supervision and RW with car height set to simulate level of a mid-size SUV. Able to manage LE's appropriately.  Returned to her [place] with [place] time management. Ambulated  with RW and supervision within her room back to her recliner. She remained seated in recliner with needs in reach and chair alarm on.    Therapy Documentation Precautions:  Precautions Precautions: Fall Restrictions Weight Bearing Restrictions: No  Therapy/Group: Individual  Therapy  [name] PT 07/09/2020, 7:30 AM

## 2020-07-09 NOTE — Progress Notes (Signed)
Occupational Therapy Session Note  Patient Details  Name: JAMINA MACBETH MRN: [id number] Date of Birth: 1956/01/11  Today's Date: 07/09/2020 OT Individual Time: 1100-1210 OT Individual Time Calculation (min): 70 min    Short Term Goals: Week 1:  OT Short Term Goal 1 (Week 1): Pt will complete toilet transfer with supervision. OT Short Term Goal 2 (Week 1): Pt will complete UB/LB bathing with supervision. OT Short Term Goal 3 (Week 1): Pt will complete LB dressing with supervision. OT Short Term Goal 4 (Week 1): Pt will complete shower transfer with supervision.  Skilled Therapeutic Interventions/Progress Updates:    Pt sitting up EOB, agreeable to OT session, no c/o pain.  Pt issued walker bag and educated on use for ADL item retrieval; return demonstrated with supervision for RW mgt.  Pt transported to ADL bathroom and educated on safe tub bench transfer at tubshower level.  Pt completed with CGA w/c to tub bench using RW.  Pt completed all LB bathing and dressing at supervision level and UB bathing and dressing with setup.  Pt transferred out of tub to w/c using RW with supervision.  Pt transported back to room, ambulating around bed to recliner with supervision using RW.  Call bell in reach, seat alarm on.  Therapy Documentation Precautions:  Precautions Precautions: Fall Restrictions Weight Bearing Restrictions: No   Therapy/Group: Individual Therapy  Ezekiel Slocumb 07/09/2020, 4:32 PM

## 2020-07-09 NOTE — Progress Notes (Signed)
[place] PHYSICAL MEDICINE & REHABILITATION PROGRESS NOTE  Subjective/Complaints: Patient seen standing up, and ambulating with therapy this morning.  She states she slept well overnight.  Discussed pulmonary meds with patient.  ROS: +Left sided numbness, baseline shortness of breath, stable.  Denies abd pain, CP, N/V/C/D   Objective: Vital Signs: Blood pressure (!) 155/86, pulse 67, temperature 97.9 F (36.6 C), temperature source Oral, resp. rate 18, height _0  (1.6 m), weight 86.6 kg, SpO2 98 %. No results found. Recent Labs    07/08/20 0507 07/09/20 0505  WBC 3.5* 3.4*  HGB 10.0* 9.7*  HCT 27.9* 27.5*  PLT 142* 142*   Recent Labs    07/07/20 0534  CREATININE 1.80*    Intake/Output Summary (Last 24 hours) at 07/09/2020 1237 Last data filed at 07/09/2020 0932 Gross per 24 hour  Intake 118 ml  Output --  Net 118 ml        Physical Exam: BP (!) 155/86 (BP Location: Left Arm)   Pulse 67   Temp 97.9 F (36.6 C) (Oral)   Resp 18   Ht _1  (1.6 m)   Wt 86.6 kg   SpO2 98%   BMI 33.82 kg/m  Constitutional: No distress . Vital signs reviewed. HENT: Normocephalic.  Atraumatic. Eyes: EOMI. No discharge. Cardiovascular: No JVD.  RRR. Respiratory: Normal effort.  No stridor.  Bilateral clear to auscultation. GI: Non-distended.  BS +. Skin: Warm and dry.  Intact. Psych: Flat.  Normal behavior. Musc: No edema in extremities.  No tenderness in extremities. Neuro: Alert Motor: RUE/RLE: 5/5 proximal distally in LUE: 4+/5 proximal distal, stable LLE: Hip flexion, knee extension 4+/5, ankle dorsiflexion 4+/5 with apraxia, unchanged Left-sided numbness, stable  Assessment/Plan: 1. Functional deficits which require 3+ hours per day of interdisciplinary therapy in a comprehensive inpatient rehab setting.  Physiatrist is providing close team supervision and 24 hour management of active medical problems listed below.  Physiatrist and rehab team continue to assess  barriers to discharge/monitor patient progress toward functional and medical goals   Care Tool:  Bathing    Body parts bathed by patient: Right arm,Left arm,Chest,Abdomen,Front perineal area,Buttocks,Right upper leg,Left upper leg,Face         Bathing assist Assist Level: Supervision/Verbal cueing     Upper Body Dressing/Undressing Upper body dressing   What is the patient wearing?: Bra,Pull over shirt    Upper body assist Assist Level: Supervision/Verbal cueing    Lower Body Dressing/Undressing Lower body dressing      What is the patient wearing?: Underwear/pull up,Pants     Lower body assist Assist for lower body dressing: Contact Guard/Touching assist     Toileting Toileting    Toileting assist Assist for toileting: Supervision/Verbal cueing     Transfers Chair/bed transfer  Transfers assist     Chair/bed transfer assist level: Supervision/Verbal cueing Chair/bed transfer assistive device: Programmer, multimedia   Ambulation assist      Assist level: Contact Guard/Touching assist Assistive device: Walker-rolling Max distance: >150 ft   Walk 10 feet activity   Assist     Assist level: Contact Guard/Touching assist Assistive device: Walker-rolling   Walk 50 feet activity   Assist    Assist level: Contact Guard/Touching assist Assistive device: Walker-rolling    Walk 150 feet activity   Assist    Assist level: Contact Guard/Touching assist Assistive device: Walker-rolling    Walk 10 feet on uneven surface  activity   Assist Walk 10 feet on uneven surfaces  activity did not occur: Safety/medical concerns         Wheelchair     Assist Will patient use wheelchair at discharge?: No             Wheelchair 50 feet with 2 turns activity    Assist            Wheelchair 150 feet activity     Assist           Medical Problem List and Plan: 1. 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   Continue CIR 2. Antithrombotics: -DVT/anticoagulation:Lovenox -antiplatelet therapy: Aspirin 81 mg daily and Plavix 75 mg daily x3 weeks then Plavix alone 3. Pain Management:Neurontin 300 mg 3 times daily  Controlled with meds on 1/27  Monitor with increased mobility 4. Mood:Lexapro 10 mg daily, BuSpar 10 mg daily, Vistaril 25 mg twice daily  Increased Lexapro to 20 mg daily due to severe depression  Neuropsych eval -antipsychotic agents: N/A 5. Neuropsych: This patientiscapable of making decisions on herown behalf. 6. Skin/Wound Care:Routine skin checks 7. Fluids/Electrolytes/Nutrition:Routine in and outs 8. Hypertension. Norvasc 10 mg daily, Toprol-XL 50 mg daily  Hydralazine 10 TID started on 1/26, increased on 1/27  Monitor with increased mobility. 9. Diabetes mellitus with peripheral neuropathy. Hemoglobin A1c 5.2.   Lantus insulin 25 units twice daily, decreased to 23 BID on 1/23, decreased to 20 BID on 1/25  Plan to initiate Novolog to stabilize CBGs after accommodated to decreased insulin  Labile on 1/27, monitor for trend  Monitor with increased mobility  10. COPD/asthmaPirfenidone801 mg 3 times daily, Ellipta inhaler 1 puff daily, Dulera 2 puffs twice daily -Pirfenidone makes her nauseas, sometimes have diarrhea---treat supportively as she wants to stay on medication 11.  Hyperlipidemia: Lipitor, fenofibrate,Vascepa2 mg twice daily 12. History of cocaine use remote tobacco abuse. Urine drug screen negative 13. GERD. Protonix  1/22- add Pepcid 20 mg daily per pharmacy dosing- since still having GERD Sx's.  14.?  AKI on CKD  Creatinine 1.80 on 1/24, labs ordered for tomorrow  Encourage fluids 15, Pancytopenia  ?  Pirfenidone  Trending down/stable on 1/27, discussed with pharmacy, will repeat labs tomorrow and consider discussion with  Eddie Dibbles regarding medication if necessary 16. Loose stools  1/23- due to Pirfenone for pulm fibrosis-trial imodium prn if bad- having 3-4 loose stools/day  LOS: 9 days A FACE TO FACE EVALUATION WAS PERFORMED  Arcenia Scarbro Lorie Phenix 07/09/2020, 12:37 PM

## 2020-07-10 LAB — CBC WITH DIFFERENTIAL/PLATELET
Abs Immature Granulocytes: 0.02 10*3/uL (ref 0.00–0.07)
Basophils Absolute: 0 10*3/uL (ref 0.0–0.1)
Basophils Relative: 0 %
Eosinophils Absolute: 0.1 10*3/uL (ref 0.0–0.5)
Eosinophils Relative: 3 %
HCT: 28 % — ABNORMAL LOW (ref 36.0–46.0)
Hemoglobin: 9.7 g/dL — ABNORMAL LOW (ref 12.0–15.0)
Immature Granulocytes: 1 %
Lymphocytes Relative: 29 %
Lymphs Abs: 1.2 10*3/uL (ref 0.7–4.0)
MCH: 31.3 pg (ref 26.0–34.0)
MCHC: 34.6 g/dL (ref 30.0–36.0)
MCV: 90.3 fL (ref 80.0–100.0)
Monocytes Absolute: 0.3 10*3/uL (ref 0.1–1.0)
Monocytes Relative: 6 %
Neutro Abs: 2.5 10*3/uL (ref 1.7–7.7)
Neutrophils Relative %: 61 %
Platelets: 130 10*3/uL — ABNORMAL LOW (ref 150–400)
RBC: 3.1 MIL/uL — ABNORMAL LOW (ref 3.87–5.11)
RDW: 16.6 % — ABNORMAL HIGH (ref 11.5–15.5)
WBC: 4 10*3/uL (ref 4.0–10.5)
nRBC: 0 % (ref 0.0–0.2)

## 2020-07-10 LAB — GLUCOSE, CAPILLARY
Glucose-Capillary: 53 mg/dL — ABNORMAL LOW (ref 70–99)
Glucose-Capillary: 69 mg/dL — ABNORMAL LOW (ref 70–99)
Glucose-Capillary: 79 mg/dL (ref 70–99)
Glucose-Capillary: 104 mg/dL — ABNORMAL HIGH (ref 70–99)
Glucose-Capillary: 104 mg/dL — ABNORMAL HIGH (ref 70–99)
Glucose-Capillary: 202 mg/dL — ABNORMAL HIGH (ref 70–99)

## 2020-07-10 LAB — BASIC METABOLIC PANEL
Anion gap: 11 (ref 5–15)
BUN: 27 mg/dL — ABNORMAL HIGH (ref 8–23)
CO2: 20 mmol/L — ABNORMAL LOW (ref 22–32)
Calcium: 8.8 mg/dL — ABNORMAL LOW (ref 8.9–10.3)
Chloride: 111 mmol/L (ref 98–111)
Creatinine, Ser: 1.78 mg/dL — ABNORMAL HIGH (ref 0.44–1.00)
GFR, Estimated: 31 mL/min — ABNORMAL LOW (ref >60)
Glucose, Bld: 149 mg/dL — ABNORMAL HIGH (ref 70–99)
Potassium: 3.5 mmol/L (ref 3.5–5.1)
Sodium: 142 mmol/L (ref 135–145)

## 2020-07-10 MED ORDER — INSULIN GLARGINE 100 UNIT/ML ~~LOC~~ SOLN
16.0000 [IU] | Freq: Two times a day (BID) | SUBCUTANEOUS | Status: DC
  Administered 2020-07-10 – 2020-07-11 (×2): 16 [IU] via SUBCUTANEOUS
  Filled 2020-07-10 (×3): qty 0.16

## 2020-07-10 NOTE — Progress Notes (Signed)
Occupational Therapy Weekly Progress Note  Patient Details  Name: Erica [name] MRN: [id number] Date of Birth: Nov 24, 1955  Beginning of progress report period: July 01, 2020 End of progress report period: July 10, 2020  Today's Date: 07/10/2020 OT Individual Time: 9223-0097 OT Individual Time Calculation (min): 35 min    Patient has met 4 of 4 short term goals.  Pt has made good progress this week from min A to a supervision level overall. This week will progress to working on reaching a mod I level by focusing on balance and endurance.   Patient continues to demonstrate the following deficits: muscle weakness, decreased cardiorespiratoy endurance and decreased standing balance and therefore will continue to benefit from skilled OT intervention to enhance overall performance with BADL and iADL.  Patient progressing toward long term goals..  Continue plan of care.  OT Short Term Goals Week 1:  OT Short Term Goal 1 (Week 1): Pt will complete toilet transfer with supervision. OT Short Term Goal 1 - Progress (Week 1): Met OT Short Term Goal 2 (Week 1): Pt will complete UB/LB bathing with supervision. OT Short Term Goal 2 - Progress (Week 1): Met OT Short Term Goal 3 (Week 1): Pt will complete LB dressing with supervision. OT Short Term Goal 3 - Progress (Week 1): Met OT Short Term Goal 4 (Week 1): Pt will complete shower transfer with supervision. OT Short Term Goal 4 - Progress (Week 1): Met Week 2:  OT Short Term Goal 1 (Week 2): STGs = LTGs  Skilled Therapeutic Interventions/Progress Updates:   Pt received in bed and agreeable to b/d. Due to short session time and pt having showered yesterday, pt agreeable to washing up at sink. She initially ambulated with RW to gather clothing and into bathroom with distant supervision.  In bathroom, toileted with mod I. At sink she stood to bathe with S then dressed. One safety cue needed to sit down to doff pants over feet versus standing.    She wanted to brush teeth but session time running over, so her NT came in to continue assisting.    Therapy Documentation Precautions:  Precautions Precautions: Fall Restrictions Weight Bearing Restrictions: No    Vital Signs: Therapy Vitals Temp: 97.7 F (36.5 C) Temp Source: Oral Pulse Rate: (!) 58 Resp: 16 BP: 138/76 Patient Position (if appropriate): Lying Oxygen Therapy SpO2: 94 % O2 Device: Room Air Pain: Pain Assessment Pain Score: 0-No pain ADL: ADL Eating: Independent Grooming: Supervision/safety Where Assessed-Grooming: Standing at sink Upper Body Bathing: Supervision/safety Lower Body Bathing: Supervision/safety Upper Body Dressing: Supervision/safety Lower Body Dressing: Supervision/safety Where Assessed-Lower Body Dressing: Edge of bed Toileting: Modified independent Where Assessed-Toileting: Glass blower/designer: Distant supervision Armed forces technical officer Method: Ambulating Tub/Shower Transfer: Close supervison Tub/Shower Transfer Method: [employer] Equipment: Radio broadcast assistant   Therapy/Group: Individual Therapy  [place] 07/10/2020, 8:47 AM

## 2020-07-10 NOTE — Progress Notes (Signed)
Hypoglycemic Event  CBG: 53  Treatment: 4 oz juice/soda  Symptoms: None  Follow-up CBG: Time:622 CBG Result:79  Possible Reasons for Event: Unknown  Comments/MD notified: Marlowe [place] PA notified    Kathreen Cosier

## 2020-07-10 NOTE — Progress Notes (Signed)
[facility] PHYSICAL MEDICINE & REHABILITATION PROGRESS NOTE  Subjective/Complaints: Patient seen standing up with rolling walker this morning.  She states she slept well overnight.  She denies complaints.  She appears in better spirits this morning.  ROS: +Left sided numbness, baseline shortness of breath, unchanged.  Denies abd pain, CP, N/V/C/D   Objective: Vital Signs: Blood pressure 138/76, pulse (!) 58, temperature 97.7 F (36.5 C), temperature source Oral, resp. rate 16, height _0  (1.6 m), weight 86.6 kg, SpO2 94 %. No results found. Recent Labs    07/09/20 0505 07/10/20 0748  WBC 3.4* 4.0  HGB 9.7* 9.7*  HCT 27.5* 28.0*  PLT 142* 130*   Recent Labs    07/10/20 0748  NA 142  K 3.5  CL 111  CO2 20*  GLUCOSE 149*  BUN 27*  CREATININE 1.78*  CALCIUM 8.8*    Intake/Output Summary (Last 24 hours) at 07/10/2020 1126 Last data filed at 07/10/2020 1000 Gross per 24 hour  Intake 377 ml  Output 1 ml  Net 376 ml        Physical Exam: BP 138/76 (BP Location: Right Arm)   Pulse (!) 58   Temp 97.7 F (36.5 C) (Oral)   Resp 16   Ht _1  (1.6 m)   Wt 86.6 kg   SpO2 94%   BMI 33.82 kg/m  Constitutional: No distress . Vital signs reviewed. HENT: Normocephalic.  Atraumatic. Eyes: EOMI. No discharge. Cardiovascular: No JVD.  RRR. Respiratory: Normal effort.  No stridor.  Bilateral clear to auscultation. GI: Non-distended.  BS +. Skin: Warm and dry.  Intact. Psych: Flat, but improved this morning.  Normal behavior. Musc: No edema in extremities.  No tenderness in extremities. Neuro: Alert Motor: RUE/RLE: 5/5 proximal distally in LUE: 4+/5 proximal distal, stable LLE: Hip flexion, knee extension 4+/5, ankle dorsiflexion 4+/5 with apraxia, stable Left-sided numbness, stable  Assessment/Plan: 1. Functional deficits which require 3+ hours per day of interdisciplinary therapy in a comprehensive inpatient rehab setting.  Physiatrist is providing close team  supervision and 24 hour management of active medical problems listed below.  Physiatrist and rehab team continue to assess barriers to discharge/monitor patient progress toward functional and medical goals   Care Tool:  Bathing    Body parts bathed by patient: Right arm,Left arm,Chest,Abdomen,Front perineal area,Buttocks,Right upper leg,Left upper leg,Face,Left lower leg,Right lower leg         Bathing assist Assist Level: Supervision/Verbal cueing     Upper Body Dressing/Undressing Upper body dressing   What is the patient wearing?: Bra,Pull over shirt    Upper body assist Assist Level: Set up assist    Lower Body Dressing/Undressing Lower body dressing      What is the patient wearing?: Pants,Underwear/pull up     Lower body assist Assist for lower body dressing: Supervision/Verbal cueing     Toileting Toileting    Toileting assist Assist for toileting: Supervision/Verbal cueing     Transfers Chair/bed transfer  Transfers assist     Chair/bed transfer assist level: Supervision/Verbal cueing Chair/bed transfer assistive device: Programmer, multimedia   Ambulation assist      Assist level: Contact Guard/Touching assist Assistive device: Walker-rolling Max distance: >150 ft   Walk 10 feet activity   Assist     Assist level: Contact Guard/Touching assist Assistive device: Walker-rolling   Walk 50 feet activity   Assist    Assist level: Contact Guard/Touching assist Assistive device: Walker-rolling    Walk 150 feet  activity   Assist    Assist level: Contact Guard/Touching assist Assistive device: Walker-rolling    Walk 10 feet on uneven surface  activity   Assist Walk 10 feet on uneven surfaces activity did not occur: Safety/medical concerns         Wheelchair     Assist Will patient use wheelchair at discharge?: No             Wheelchair 50 feet with 2 turns activity    Assist             Wheelchair 150 feet activity     Assist           Medical Problem List and Plan: 1. 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   Continue CIR 2. Antithrombotics: -DVT/anticoagulation:Lovenox on hold, see #15 -antiplatelet therapy: Aspirin 81 mg daily and Plavix 75 mg daily x3 weeks then Plavix alone 3. Pain Management:Neurontin 300 mg 3 times daily  Controlled with meds on 1/28  Monitor with increased mobility 4. Mood:Lexapro 10 mg daily, BuSpar 10 mg daily, Vistaril 25 mg twice daily  Increased Lexapro to 20 mg daily due to severe depression  Neuropsych eval -antipsychotic agents: N/A 5. Neuropsych: This patientiscapable of making decisions on herown behalf. 6. Skin/Wound Care:Routine skin checks 7. Fluids/Electrolytes/Nutrition:Routine in and outs 8. Hypertension. Norvasc 10 mg daily, Toprol-XL 50 mg daily  Hydralazine 10 TID started on 1/26, increased on 1/27  Improving on 1/28  Monitor with increased mobility. 9. Diabetes mellitus with peripheral neuropathy. Hemoglobin A1c 5.2.   Lantus insulin 25 units twice daily, decreased to 23 BID on 1/23, decreased to 20 BID on 1/25, decreased to 16 twice daily on 1/28  Plan to initiate Novolog to stabilize CBGs after accommodated to decreased insulin  Labile with hypoglycemia on 1/20  Monitor with increased mobility  10. COPD/asthmaPirfenidone801 mg 3 times daily, Ellipta inhaler 1 puff daily, Dulera 2 puffs twice daily -Pirfenidone makes her nauseas, sometimes have diarrhea---treat supportively as she wants to stay on medication 11.  Hyperlipidemia: Lipitor, fenofibrate,Vascepa2 mg twice daily 12. History of cocaine use remote tobacco abuse. Urine drug screen negative 13. GERD. Protonix  1/22- add Pepcid 20 mg daily per pharmacy dosing- since still having GERD Sx's.  14.?  AKI on  CKD  Creatinine 1.78 on 1/28  Encourage fluids 15, Pancytopenia  ?  Pirfenidone  Cell lines stabilizing (fluctuates at baseline), except for platelets-HIT ordered  Labs ordered for Monday 16. Loose stools  1/23- due to Pirfenone for pulm fibrosis-trial imodium prn if bad- having 3-4 loose stools/day  LOS: 10 days A FACE TO FACE EVALUATION WAS PERFORMED  Erica [name] 07/10/2020, 11:26 AM

## 2020-07-10 NOTE — Progress Notes (Signed)
Physical Therapy Session Note  Patient Details  Name: Erica [name] MRN: [id number] Date of Birth: Dec 10, 1955  Today's Date: 07/10/2020 PT Individual Time: 1015-1110 + 0272-5366 PT Individual Time Calculation (min): 55 min  + 13 min  Short Term Goals: Week 1:  PT Short Term Goal 1 (Week 1): Pt will complete bed mobility with supervision PT Short Term Goal 1 - Progress (Week 1): Met PT Short Term Goal 2 (Week 1): pt will complete bed<>chair transfers with supervision and LRAD PT Short Term Goal 3 (Week 1): Pt will ambulate 158f with CGA and LRAD PT Short Term Goal 3 - Progress (Week 1): Met PT Short Term Goal 4 (Week 1): Pt will navigate up/down at least 4 steps with minA and LRAD PT Short Term Goal 4 - Progress (Week 1): Met  Skilled Therapeutic Interventions/Progress Updates:     1st session: [place] in bathroom on arrival and performed sit<>stand from toilet to RW, unsupervised. Ambulated from bathroom to EOB with distant supervision and RW. Pt c/o L knee pain with antalgic and effortful gait pattern. At first she reports this as a "muscle cramp" but reports origin at anterior patella. She reports this pain began when she sat down on the toilet a few minutes ago. She reports she fell on her L knee when she had the stroke. Pt has full pain-free AROM in L knee and is nontender to palpation with no edema present. Suspect this may be of arthritic origin. Coordinated with RN to provide pain medication for our session and also discussed possibility of topical ointment for future management.  W/c transport for time management to day room therapy gym. Performed dynamic standing balance while incorporating bean bag toss with unilateral UE support to RW. She actually had more difficulty with RUE > LUE in terms of consistency and accuracy of her throws. No LOB during this but she did rely quite heavily on RW for support and did not feel comfortable trying to toss without UE  support.   Ambulated ~144fwith supervision and RW within the gym to high/low table. Performed standing with RW support and supervision while using LUE for placing pinch pins to bars, focusing on [facility] LUE and gross functional reaching with LUE. She was able to complete x10 black pinch pin's.  Gait training x12535fith supervision and RW. Gait deficits include slightly antaligc with decreased L weight shift and reduced gait speed, starting with step-through pattern and regressing to step-to pattern. No knee buckling or LOB but further gait distances limited primarily by L knee pain (pt reports 6/10).  W/c transport for time management and energy conservation back to her room with totalA. She ambulated with supervision and her RW within her room to her recliner. She remained seated in recliner with chair alarm on and daughter at bedside, needs within reach.  2nd session: Pt received with sister ambulating patient to the bathroom. Hand-off of care to therapist. Pt able to perform stand>sit with distant supervision to toilet height but required cues for safety approach. Pt continent of bladder and able to manage lower body dressing mod I. Ambulated back to the EOB with supervision and RW but then reporting she does not want to do therapy due to general fatigue and her L knee bothering her. Unable to motivate her to participate. Therefore ,returned to bed with supervision and she remained supine in bed with bed alarm on and needs in reach. She missed 32 minutes of skilled  therapy.   Therapy Documentation Precautions:  Precautions Precautions: Fall Restrictions Weight Bearing Restrictions: No General: PT Amount of Missed Time (min): 32 Minutes PT Missed Treatment Reason: Patient unwilling to participate  Therapy/Group: Individual Therapy  [name] 07/10/2020, 7:29 AM

## 2020-07-10 NOTE — Progress Notes (Signed)
Pt c/o of left leg cramping with therapy, notified PA, after speaking to pt she was c/o left knee pain. Has prev hx of trauma to that knee per pt and uses a gel on it at home.

## 2020-07-10 NOTE — Progress Notes (Signed)
Inpatient Rehabilitation Care Coordinator Discharge Note  The overall goal for the admission was met for:   Discharge location: [place] ASSISTING-ALMOST 24/7 SUPERVISION  Length of Stay: Yes-15 DAYS  Discharge activity level: Yes-SUPERVISION DUE TO SAFETY ISSUES  Home/community participation: Yes  Services provided included: MD, RD, PT, OT, SLP, RN, CM, Pharmacy, Neuropsych and SW  Financial Services: Private Insurance: [place] offered to/list presented to:YES  Follow-up services arranged: Home Health: [facility]-PT,OT,SP, DME: [facility]-ROLLING WALK[facility], TUB BENCH AND 3 IN 1 and Patient/Family has no preference for HH/DME agencies  Comments (or additional information):SISTER-[name] WAS HERE FOR EDUCATION WILL BE HELPING HER AT DISCHARGE. PT'S SON IS NOT DEPENDABLE EVEN THOUGH LIVES WITH HER AT HOME. PT WAS SUPPOSE TO MOVE END OF THE MONTH AND UNSURE WHERE THAT STANDS PT AND SISTER LOOKING INTO THIS. [facility] ASSISTING HER WITH THIS. [name] OFFERED FOR PT TO GO TO HER HOME IN [place] AND PT REFUSED  Patient/Family verbalized understanding of follow-up arrangements: Yes  Individual responsible for coordination of the follow-up plan: [name]-SISTER [phone]  Confirmed correct DME delivered: Elease Hashimoto 07/10/2020    [place], Gardiner Rhyme

## 2020-07-10 NOTE — Progress Notes (Signed)
Speech Language Pathology Weekly Progress and Session Note  Patient Details  Name: Erica [name] MRN: [id number] Date of Birth: December 01, 1955  Beginning of progress report period: 07/03/20 End of progress report period: 07/10/20  Today's Date: 07/10/2020 SLP Individual Time: 0930-1015 SLP Individual Time Calculation (min): 45 min  Short Term Goals: Week 1: SLP Short Term Goal 1 (Week 1): Pt will increase functional recall of novel information with use of trained compensatory strategies with 80% accuracy SLP Short Term Goal 1 - Progress (Week 1): Met SLP Short Term Goal 2 (Week 1): Pt will complete complex problem solving tasks to increase safety at current and d/c environment with Supervision A SLP Short Term Goal 2 - Progress (Week 1): Met SLP Short Term Goal 3 (Week 1): Pt will increase executive function skills (planning, organizing) to 90% accuracy with Supervision A SLP Short Term Goal 3 - Progress (Week 1): Met SLP Short Term Goal 4 (Week 1): Pt will increase sustained attention to >15 minutes with Supervision A SLP Short Term Goal 4 - Progress (Week 1): Met    New Short Term Goals: Week 2: SLP Short Term Goal 1 (Week 2): STG=LTG (ELOS: d/c 2/2)  Weekly Progress Updates:  Patient has made good progress and met all STG's for cognitive function and currently at supervision A for mild complex ADL's (medication management, etc). She is on track to meet LTG"s by anticipated discharge date.   Intensity: Minumum of 1-2 x/day, 30 to 90 minutes Frequency: 3 to 5 out of 7 days Duration/Length of Stay: 2/2 Treatment/Interventions: Cognitive remediation/compensation;Therapeutic Activities;Therapeutic Exercise;Patient/family education   Daily Session  Skilled Therapeutic Interventions: Patient seen to address cognitive function skills with finishing medication pill box organizer. Paitent was able to complete with supervision level A and demonstrated adequate attention, awareness of errors and  self-correction of errors. She continues to benefit from skilled SLP intervention to maximize cognitive function prior to discharge home.    General     Pain  No pain  Therapy/Group: Individual Therapy  Sonia Baller, MA, CCC-SLP Speech Therapy

## 2020-07-11 LAB — GLUCOSE, CAPILLARY
Glucose-Capillary: 105 mg/dL — ABNORMAL HIGH (ref 70–99)
Glucose-Capillary: 55 mg/dL — ABNORMAL LOW (ref 70–99)
Glucose-Capillary: 169 mg/dL — ABNORMAL HIGH (ref 70–99)
Glucose-Capillary: 160 mg/dL — ABNORMAL HIGH (ref 70–99)
Glucose-Capillary: 144 mg/dL — ABNORMAL HIGH (ref 70–99)

## 2020-07-11 LAB — HEPARIN INDUCED PLATELET AB (HIT ANTIBODY): Heparin Induced Plt Ab: 0.261 OD (ref 0.000–0.400)

## 2020-07-11 MED ORDER — DICLOFENAC SODIUM 1 % EX GEL
2.0000 g | Freq: Four times a day (QID) | CUTANEOUS | Status: DC
  Administered 2020-07-11 – 2020-07-15 (×8): 2 g via TOPICAL
  Filled 2020-07-11: qty 100

## 2020-07-11 MED ORDER — INSULIN GLARGINE 100 UNIT/ML ~~LOC~~ SOLN
12.0000 [IU] | Freq: Two times a day (BID) | SUBCUTANEOUS | Status: DC
  Administered 2020-07-11 – 2020-07-15 (×8): 12 [IU] via SUBCUTANEOUS
  Filled 2020-07-11 (×10): qty 0.12

## 2020-07-11 NOTE — Progress Notes (Signed)
Hypoglycemic Event  CBG: 55  Treatment: 4 oz juice/soda  Symptoms: None  Follow-up CBG: [id number] CBG Result:105  Possible Reasons for Event: Unknown  Comments/MD notified: Kirsteins MD on call notified    Kathreen Cosier

## 2020-07-11 NOTE — Progress Notes (Signed)
[place] PHYSICAL MEDICINE & REHABILITATION PROGRESS NOTE  Subjective/Complaints: . "top of mouth" feels funny , no other c/os, discussed low am CBG, had apple at bedtime ROS:  Denies abd pain, CP, N/V/C/D   Objective: Vital Signs: Blood pressure (!) 128/97, pulse 73, temperature 97.9 F (36.6 C), resp. rate 18, height _0  (1.6 m), weight 86.6 kg, SpO2 98 %. No results found. Recent Labs    07/09/20 0505 07/10/20 0748  WBC 3.4* 4.0  HGB 9.7* 9.7*  HCT 27.5* 28.0*  PLT 142* 130*   Recent Labs    07/10/20 0748  NA 142  K 3.5  CL 111  CO2 20*  GLUCOSE 149*  BUN 27*  CREATININE 1.78*  CALCIUM 8.8*    Intake/Output Summary (Last 24 hours) at 07/11/2020 0855 Last data filed at 07/11/2020 0719 Gross per 24 hour  Intake 238 ml  Output 1 ml  Net 237 ml        Physical Exam: BP (!) 128/97   Pulse 73   Temp 97.9 F (36.6 C)   Resp 18   Ht _1  (1.6 m)   Wt 86.6 kg   SpO2 98%   BMI 33.82 kg/m  ENT- hard palate looks normal  General: No acute distress Mood and affect are appropriate Heart: Regular rate and rhythm no rubs murmurs or extra sounds Lungs: Clear to auscultation, breathing unlabored, no rales or wheezes Abdomen: Positive bowel sounds, soft nontender to palpation, nondistended Extremities: No clubbing, cyanosis, or edema Skin: No evidence of breakdown, no evidence of rash   Psych: Flat, but improved this morning.  Normal behavior. Musc: No edema in extremities.  No tenderness in extremities. Neuro: Alert Motor: RUE/RLE: 5/5 proximal distally in LUE: 4+/5 proximal distal, stable LLE: Hip flexion, knee extension 4+/5, ankle dorsiflexion 4+/5 with apraxia, stable Left-sided numbness, stable  Assessment/Plan: 1. Functional deficits which require 3+ hours per day of interdisciplinary therapy in a comprehensive inpatient rehab setting.  Physiatrist is providing close team supervision and 24 hour management of active medical problems listed  below.  Physiatrist and rehab team continue to assess barriers to discharge/monitor patient progress toward functional and medical goals   Care Tool:  Bathing    Body parts bathed by patient: Right arm,Left arm,Chest,Abdomen,Front perineal area,Buttocks,Right upper leg,Left upper leg,Face,Left lower leg,Right lower leg         Bathing assist Assist Level: Supervision/Verbal cueing     Upper Body Dressing/Undressing Upper body dressing   What is the patient wearing?: Bra,Pull over shirt    Upper body assist Assist Level: Independent    Lower Body Dressing/Undressing Lower body dressing      What is the patient wearing?: Pants,Underwear/pull up     Lower body assist Assist for lower body dressing: Supervision/Verbal cueing     Toileting Toileting    Toileting assist Assist for toileting: Independent with assistive device     Transfers Chair/bed transfer  Transfers assist     Chair/bed transfer assist level: Supervision/Verbal cueing Chair/bed transfer assistive device: Programmer, multimedia   Ambulation assist      Assist level: Contact Guard/Touching assist Assistive device: Walker-rolling Max distance: >150 ft   Walk 10 feet activity   Assist     Assist level: Contact Guard/Touching assist Assistive device: Walker-rolling   Walk 50 feet activity   Assist    Assist level: Contact Guard/Touching assist Assistive device: Walker-rolling    Walk 150 feet activity   Assist  Assist level: Contact Guard/Touching assist Assistive device: Walker-rolling    Walk 10 feet on uneven surface  activity   Assist Walk 10 feet on uneven surfaces activity did not occur: Safety/medical concerns         Wheelchair     Assist Will patient use wheelchair at discharge?: No             Wheelchair 50 feet with 2 turns activity    Assist            Wheelchair 150 feet activity     Assist            Medical Problem List and Plan: 1. 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   Continue CIR PT, OT ,SLP  2. Antithrombotics: -DVT/anticoagulation:Lovenox on hold, see #15 -antiplatelet therapy: Aspirin 81 mg daily and Plavix 75 mg daily x3 weeks then Plavix alone 3. Pain Management:Neurontin 300 mg 3 times daily  Controlled with meds on 1/28  Monitor with increased mobility 4. Mood:Lexapro 10 mg daily, BuSpar 10 mg daily, Vistaril 25 mg twice daily  Increased Lexapro to 20 mg daily due to severe depression  Neuropsych eval -antipsychotic agents: N/A 5. Neuropsych: This patientiscapable of making decisions on herown behalf. 6. Skin/Wound Care:Routine skin checks 7. Fluids/Electrolytes/Nutrition:Routine in and outs 8. Hypertension. Norvasc 10 mg daily, Toprol-XL 50 mg daily  Hydralazine 10 TID started on 1/26, increased on 1/27  Improving on 1/28  Monitor with increased mobility. 9. Diabetes mellitus with peripheral neuropathy. Hemoglobin A1c 5.2.   Lantus insulin 25 units twice daily, decreased to 23 BID on 1/23, decreased to 20 BID on 1/25, decreased to 16 twice daily on 1/28  Plan to initiate Novolog to stabilize CBGs after accommodated to decreased insulin   CBG (last 3)  Recent Labs    07/10/20 1945 07/11/20 0552 07/11/20 0614  GLUCAP 202* 55* 105*  still with low am CBG will reduce Lantus to 12U    Monitor with increased mobility  10. COPD/asthmaPirfenidone801 mg 3 times daily, Ellipta inhaler 1 puff daily, Dulera 2 puffs twice daily -Pirfenidone makes her nauseas, sometimes have diarrhea---treat supportively as she wants to stay on medication 11.  Hyperlipidemia: Lipitor, fenofibrate,Vascepa2 mg twice daily 12. History of cocaine use remote tobacco abuse. Urine drug screen negative 13. GERD. Protonix  1/22- add Pepcid 20 mg  daily per pharmacy dosing- since still having GERD Sx's.  14.?  AKI on CKD  Creatinine 1.78 on 1/28  Encourage fluids 15, Pancytopenia  ?  Pirfenidone  Cell lines stabilizing (fluctuates at baseline), except for platelets-HIT ordered  Labs ordered for Monday 16. Loose stools  1/23- due to Pirfenone for pulm fibrosis-trial imodium prn if bad- having 3-4 loose stools/day  LOS: 11 days A FACE TO FACE EVALUATION WAS PERFORMED  Charlett Blake 07/11/2020, 8:55 AM

## 2020-07-11 NOTE — Progress Notes (Signed)
Occupational Therapy Session Note  Patient Details  Name: Erica [name] MRN: [id number] Date of Birth: 04-Nov-1955  Today's Date: 07/11/2020 OT Individual Time: 6644-0347 OT Individual Time Calculation (min): 31 min    Short Term Goals: Week 2:  OT Short Term Goal 1 (Week 2): STGs = LTGs  Skilled Therapeutic Interventions/Progress Updates:    Pt in bed to start session, agreeable to participate in mobility and LUE neuromuscular re-education.  She was able to transfer to the EOB with supervision but needed mod assist for donning her tie up shoes.  She then ambulated from her room to the dayroom with min assist.  She had to stop at times secondary to fatigue with increased toe drag and trunk flexion noted.  Once in the dayroom she sat on the mat and worked on LUE FM coordination, picking up small wooden 2" pegs and placing them in the peg board.  She was able to pick up pegs one at a times and translate them from fingertips to palm and then back to place them in the grid.  She also removed them from the grid in the same manner, both with 100% accuracy.  Had her ambulate back to the room after completion of task with overall min assist again.  She then transferred back to the bed to rest with her sister present as well.  Call button and phone in reach with safety bed alarm in place.    Therapy Documentation Precautions:  Precautions Precautions: Fall Precaution Comments: left hemiparesis Restrictions Weight Bearing Restrictions: No  Pain: Pain Assessment Pain Scale: Faces Pain Score: 0-No pain ADL: See Care Tool Section for some details of mobility and selfcare  Therapy/Group: Individual Therapy  Chevy Virgo OTR/L 07/11/2020, 4:38 PM

## 2020-07-12 LAB — GLUCOSE, CAPILLARY
Glucose-Capillary: 97 mg/dL (ref 70–99)
Glucose-Capillary: 141 mg/dL — ABNORMAL HIGH (ref 70–99)
Glucose-Capillary: 135 mg/dL — ABNORMAL HIGH (ref 70–99)
Glucose-Capillary: 394 mg/dL — ABNORMAL HIGH (ref 70–99)

## 2020-07-12 NOTE — Progress Notes (Addendum)
Occupational Therapy Session Note  Patient Details  Name: Erica [name] MRN: [id number] Date of Birth: 1955-12-12  Today's Date: 07/12/2020 OT Individual Time: 1300-1400 OT Individual Time Calculation (min): 60 min    Short Term Goals: Week 2:  OT Short Term Goal 1 (Week 2): STGs = LTGs  Skilled Therapeutic Interventions/Progress Updates:    Pt received supine with no c/o pain, requesting to take shower. Pt completed bed mobility to EOB with supervision. Sit > stand with close supervision with use of the RW. She ambulated around room to gather items for shower. Min cueing required for safe RW management with some deficits evident in safety awareness. She transferred onto [facility] while shower heated up. She required min cueing and min A for safely doffing LB clothing. In the shower pt was able to wash UB and LB thoroughly with set up assist and intermittent supervision. Pt returned to the EOB after shower with CGA for safety. Socks donned EOB with supervision. Pants donned x2 attempts sit <> stand with RW, but with supervision overall. She donned shirt with mod I. Oral care in standing with supervision. Pt was taken via w/c to the therapy gym. Warm up [facility] activity at midline, threading small beads onto a string bimanually with focus on LUE attention/coordination. Pt then stood and completed functional reaching activity with the LUE without RUE support with clothespins of different resistances. Reaching across midline in standing to challenge dynamic standing balance with CGA provided. Min cueing for hand prehension and proper grasping pattern. Pt returned to her room and was left sitting in the recliner with all needs met, chair alarm set.   Therapy Documentation Precautions:  Precautions Precautions: Fall Precaution Comments: left hemiparesis Restrictions Weight Bearing Restrictions: No   Therapy/Group: Individual Therapy  Curtis Sites 07/12/2020, 6:23 AM

## 2020-07-12 NOTE — Progress Notes (Signed)
[facility] PHYSICAL MEDICINE & REHABILITATION PROGRESS NOTE  Subjective/Complaints: . No issues overnite, discussed BP and CBGs   ROS:  Denies abd pain, CP, N/V/C/D   Objective: Vital Signs: Blood pressure (!) 157/81, pulse 62, temperature 98.1 F (36.7 C), temperature source Oral, resp. rate 17, height _0  (1.6 m), weight 86.6 kg, SpO2 100 %. No results found. Recent Labs    07/10/20 0748  WBC 4.0  HGB 9.7*  HCT 28.0*  PLT 130*   Recent Labs    07/10/20 0748  NA 142  K 3.5  CL 111  CO2 20*  GLUCOSE 149*  BUN 27*  CREATININE 1.78*  CALCIUM 8.8*    Intake/Output Summary (Last 24 hours) at 07/12/2020 0936 Last data filed at 07/11/2020 1943 Gross per 24 hour  Intake 418 ml  Output --  Net 418 ml        Physical Exam: BP (!) 157/81 (BP Location: Left Arm)   Pulse 62   Temp 98.1 F (36.7 C) (Oral)   Resp 17   Ht _1  (1.6 m)   Wt 86.6 kg   SpO2 100%   BMI 33.82 kg/m   General: No acute distress Mood and affect are appropriate Heart: Regular rate and rhythm no rubs murmurs or extra sounds Lungs: Clear to auscultation, breathing unlabored, no rales or wheezes Abdomen: Positive bowel sounds, soft nontender to palpation, nondistended Extremities: No clubbing, cyanosis, or edema Skin: No evidence of breakdown, no evidence of rash    Psych: Flat, but improved this morning.  Normal behavior. Musc: No edema in extremities.  No tenderness in extremities. Neuro: Alert Motor: RUE/RLE: 5/5 proximal distally in LUE: 4+/5 proximal distal, stable LLE: Hip flexion, knee extension 4+/5, ankle dorsiflexion 4+/5 with apraxia, stable Left-sided numbness, stable  Assessment/Plan: 1. Functional deficits which require 3+ hours per day of interdisciplinary therapy in a comprehensive inpatient rehab setting.  Physiatrist is providing close team supervision and 24 hour management of active medical problems listed below.  Physiatrist and rehab team continue to assess  barriers to discharge/monitor patient progress toward functional and medical goals   Care Tool:  Bathing    Body parts bathed by patient: Right arm,Left arm,Chest,Abdomen,Front perineal area,Buttocks,Right upper leg,Left upper leg,Face,Left lower leg,Right lower leg         Bathing assist Assist Level: Supervision/Verbal cueing     Upper Body Dressing/Undressing Upper body dressing   What is the patient wearing?: Bra,Pull over shirt    Upper body assist Assist Level: Independent    Lower Body Dressing/Undressing Lower body dressing      What is the patient wearing?: Pants,Underwear/pull up     Lower body assist Assist for lower body dressing: Supervision/Verbal cueing     Toileting Toileting    Toileting assist Assist for toileting: Independent with assistive device     Transfers Chair/bed transfer  Transfers assist     Chair/bed transfer assist level: Minimal Assistance - Patient > 75% Chair/bed transfer assistive device: Programmer, multimedia   Ambulation assist      Assist level: Minimal Assistance - Patient > 75% Assistive device: Walker-rolling Max distance: 100'   Walk 10 feet activity   Assist     Assist level: Contact Guard/Touching assist Assistive device: Walker-rolling   Walk 50 feet activity   Assist    Assist level: Contact Guard/Touching assist Assistive device: Walker-rolling    Walk 150 feet activity   Assist    Assist level: Contact Guard/Touching assist  Assistive device: Walker-rolling    Walk 10 feet on uneven surface  activity   Assist Walk 10 feet on uneven surfaces activity did not occur: Safety/medical concerns         Wheelchair     Assist Will patient use wheelchair at discharge?: No             Wheelchair 50 feet with 2 turns activity    Assist            Wheelchair 150 feet activity     Assist           Medical Problem List and Plan: 1. 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   Continue CIR PT, OT ,SLP  2. Antithrombotics: -DVT/anticoagulation:Lovenox on hold, see #15 -antiplatelet therapy: Aspirin 81 mg daily and Plavix 75 mg daily x3 weeks then Plavix alone 3. Pain Management:Neurontin 300 mg 3 times daily  Controlled with meds on 1/28  Monitor with increased mobility 4. Mood:Lexapro 10 mg daily, BuSpar 10 mg daily, Vistaril 25 mg twice daily  Increased Lexapro to 20 mg daily due to severe depression  Neuropsych eval -antipsychotic agents: N/A 5. Neuropsych: This patientiscapable of making decisions on herown behalf. 6. Skin/Wound Care:Routine skin checks 7. Fluids/Electrolytes/Nutrition:Routine in and outs 8. Hypertension. Norvasc 10 mg daily, Toprol-XL 50 mg daily  Hydralazine 10 TID started on 1/26, increased on 4/65  Some systolic elevation today will monitor before additional dosage change  Vitals:   07/12/20 0419 07/12/20 0837  BP: (!) 157/81   Pulse: 62   Resp: 17   Temp: 98.1 F (36.7 C)   SpO2: 100% 100%   9. Diabetes mellitus with peripheral neuropathy. Hemoglobin A1c 5.2.   Lantus insulin 25 units twice daily, decreased to 23 BID on 1/23, decreased to 20 BID on 1/25, decreased to 16 twice daily on 1/28  Plan to initiate Novolog to stabilize CBGs after accommodated to decreased insulin   CBG (last 3)  Recent Labs    07/11/20 1621 07/11/20 2056 07/12/20 0605  GLUCAP 160* 144* 97  still with low am CBG will reduce Lantus to 12U    Monitor with increased mobility  10. COPD/asthmaPirfenidone801 mg 3 times daily, Ellipta inhaler 1 puff daily, Dulera 2 puffs twice daily -Pirfenidone makes her nauseas, sometimes have diarrhea---treat supportively as she wants to stay on medication 11.  Hyperlipidemia: Lipitor, fenofibrate,Vascepa2 mg twice daily 12. History of cocaine  use remote tobacco abuse. Urine drug screen negative 13. GERD. Protonix  1/22- add Pepcid 20 mg daily per pharmacy dosing- since still having GERD Sx's.  14.?  AKI on CKD  Creatinine 1.78 on 1/28  Encourage fluids 15, Pancytopenia  ?  Pirfenidone  Cell lines stabilizing (fluctuates at baseline), except for platelets-HIT ordered Heparin induced platelet antibodies are negative   Labs ordered for Monday 16. Loose stools  1/23- due to Pirfenone for pulm fibrosis-trial imodium prn if bad- having 3-4 loose stools/day  LOS: 12 days A FACE TO FACE EVALUATION WAS PERFORMED  Charlett Blake 07/12/2020, 9:36 AM

## 2020-07-13 LAB — CBC
HCT: 30.6 % — ABNORMAL LOW (ref 36.0–46.0)
Hemoglobin: 10.7 g/dL — ABNORMAL LOW (ref 12.0–15.0)
MCH: 31.8 pg (ref 26.0–34.0)
MCHC: 35 g/dL (ref 30.0–36.0)
MCV: 90.8 fL (ref 80.0–100.0)
Platelets: 146 10*3/uL — ABNORMAL LOW (ref 150–400)
RBC: 3.37 MIL/uL — ABNORMAL LOW (ref 3.87–5.11)
RDW: 17.2 % — ABNORMAL HIGH (ref 11.5–15.5)
WBC: 6.8 10*3/uL (ref 4.0–10.5)
nRBC: 0 % (ref 0.0–0.2)

## 2020-07-13 LAB — GLUCOSE, CAPILLARY
Glucose-Capillary: 94 mg/dL (ref 70–99)
Glucose-Capillary: 102 mg/dL — ABNORMAL HIGH (ref 70–99)
Glucose-Capillary: 208 mg/dL — ABNORMAL HIGH (ref 70–99)
Glucose-Capillary: 105 mg/dL — ABNORMAL HIGH (ref 70–99)

## 2020-07-13 MED ORDER — HYDRALAZINE HCL 25 MG PO TABS
37.5000 mg | ORAL_TABLET | Freq: Three times a day (TID) | ORAL | Status: DC
Start: 2020-07-13 — End: 2020-07-15
  Administered 2020-07-13 – 2020-07-15 (×6): 37.5 mg via ORAL
  Filled 2020-07-13 (×6): qty 2

## 2020-07-13 NOTE — Discharge Summary (Signed)
Physical Therapy Discharge Summary  Patient Details  Name: Erica [name] MRN: [id number] Date of Birth: 01/30/1956  Today's Date: 07/14/2020 PT Individual Time: 1415-1524 PT Individual Time Calculation (min): 69 min   Patient has met 8 of 9 long term goals due to improved activity tolerance, improved balance, improved postural control, increased strength, ability to compensate for deficits, functional use of  left upper extremity and left lower extremity, improved attention, improved awareness and improved coordination.  Patient to discharge at an ambulatory level Modified Independent.   Patient's care partner is independent to provide the necessary physical assistance at discharge.  Reasons goals not met: Stair goal set to 14 with CGA. During her rehabilitation, she has changed living arrangements where she will no longer need to navigate these stairs. She has found a ground level apartment with level entry.   Recommendation:  Patient will benefit from ongoing skilled PT services in home health setting to continue to advance safe functional mobility, address ongoing impairments in LLE weakness, gait deficits, endurance and higher level balance impairments in order to minimize fall risk.  Equipment: RW  Reasons for discharge: treatment goals met and discharge from hospital  Patient/family agrees with progress made and goals achieved: Yes  PT Discharge Precautions/Restrictions Precautions Precautions: Fall Precaution Comments: Mild L hemiparesis Restrictions Weight Bearing Restrictions: No Pain Pain Assessment Pain Score: 0-No pain Vision/Perception  Vision - Assessment Eye Alignment: Within Functional Limits Ocular Range of Motion: Within Functional Limits Alignment/Gaze Preference: Within Defined Limits Perception Perception: Within Functional Limits Praxis Praxis: Intact  Cognition Overall Cognitive Status: Impaired/Different from baseline Arousal/Alertness:  Awake/alert Orientation Level: Oriented X4 Attention: Focused Focused Attention: Appears intact Memory: Impaired Memory Impairment: Decreased short term memory;Decreased recall of new information Decreased Short Term Memory: Verbal complex;Functional complex Problem Solving: Impaired Problem Solving Impairment: Functional complex;Functional basic Safety/Judgment: Appears intact Sensation Sensation Light Touch: Impaired by gross assessment (Reports persistent L facial numbness) Hot/Cold: Appears Intact Proprioception: Appears Intact Stereognosis: Not tested Coordination Gross Motor Movements are Fluid and Coordinated: No Fine Motor Movements are Fluid and Coordinated: No Coordination and Movement Description: Blocked movement patterns in LLE, especially with functional complex tasks. Improved since date of evaluation Motor  Motor Motor: Hemiplegia Motor - Discharge Observations: Mild L hemiparesis, much improve since date of evalulation  Mobility Bed Mobility Bed Mobility: Rolling Right;Rolling Left;Sit to Supine;Supine to Sit Rolling Right: Independent Rolling Left: Independent Supine to Sit: Independent Sit to Supine: Independent Transfers Transfers: Sit to Stand;Stand to Sit;Stand Pivot Transfers Sit to Stand: Independent with assistive device Stand to Sit: Independent with assistive device Stand Pivot Transfers: Independent with assistive device Transfer (Assistive device): Rolling walker Locomotion  Gait Ambulation: Yes Gait Assistance: Independent with assistive device Gait Distance (Feet): 150 Feet Assistive device: Rolling walker Gait Gait: Yes Gait Pattern: Impaired Gait Pattern: Step-to pattern;Decreased step length - right;Decreased step length - left;Decreased stance time - left;Decreased hip/knee flexion - left;Decreased dorsiflexion - left;Decreased weight shift to left;Trunk flexed Gait velocity: decreased Stairs / Additional Locomotion Stairs:  Yes Stairs Assistance: Contact Guard/Touching assist Stair Management Technique: Two rails Number of Stairs: 8 Height of Stairs: 6 Wheelchair Mobility Wheelchair Mobility: No  Trunk/Postural Assessment  Cervical Assessment Cervical Assessment: Within Functional Limits Thoracic Assessment Thoracic Assessment: Within Functional Limits Lumbar Assessment Lumbar Assessment: Within Functional Limits Postural Control Postural Control: Within Functional Limits  Balance  Berg Balance Test Sit to Stand: Able to stand  independently using hands Standing Unsupported: Able to stand safely 2 minutes Sitting with   Back Unsupported but Feet Supported on Floor or Stool: Able to sit safely and securely 2 minutes Stand to Sit: Sits safely with minimal use of hands Transfers: Able to transfer safely, definite need of hands Standing Unsupported with Eyes Closed: Able to stand 10 seconds safely Standing Ubsupported with Feet Together: Able to place feet together independently and stand for 1 minute with supervision From Standing, Reach Forward with Outstretched Arm: Can reach forward >12 cm safely (5") From Standing Position, Pick up Object from Floor: Able to pick up shoe, needs supervision From Standing Position, Turn to Look Behind Over each Shoulder: Looks behind one side only/other side shows less weight shift Turn 360 Degrees: Needs close supervision or verbal cueing Standing Unsupported, Alternately Place Feet on Step/Stool: Needs assistance to keep from falling or unable to try Standing Unsupported, One Foot in Front: Able to take small step independently and hold 30 seconds Standing on One Leg: Unable to try or needs assist to prevent fall Total Score: 37  Timed Up and Go Test TUG: Normal TUG Normal TUG (seconds): 42.33 (average of 3 trials)   Extremity Assessment  RUE Assessment RUE Assessment: Within Functional Limits General Strength Comments: Grossly 5/5 LUE Assessment LUE  Assessment: Exceptions to WFL General Strength Comments: Grossly 4/5 RLE Assessment RLE Assessment: Within Functional Limits General Strength Comments: Grossly 4/5 LLE Assessment LLE Assessment: Exceptions to WFL General Strength Comments: Ankle DF 3/5, knee ext 4/5, knee flex 4-/5, hip flex 3/5  Skilled Intervention: Pt received ambulating in room with sister providing supervision, pt using her RW. Ambulated mod I with RW to her w/c. Removed her her hospital socks indep with figure-4 technique and was able to don both sock and shoes indep although took ++ time to complete. W/c transport to ortho therapy gym. Performed car transfer with supervision and RW, cues needed for safety approach but she was able to manage her BLE's appropriately. Ambulated up/down x10ft ramp with supervision and RW but able to ambulate level surfaces mod I with RW. Completed the BERG balance test, scoring 37/56 placing patient at high falls risk and indicative of using RW for all mobility. This has been educated to the patient and she voiced understanding. Completing the BERG required several seated rest breaks due to fatigue. Her most difficutl task that impacted her score was alternating toe taps on platform, single leg stance, and 360deg turns. But her BERG improved 4 points since date of evaluation. Performed Nustep x8 minutes with BUE and BLE, with workload of 2, focusing on BLE strengthening and cardiovascular endurance. Returned to her room with totalA in w/c and ambulated within her room mod I with RW. Ended session seated in recliner with sister at bedside ,needs in reach. All questions and concerns addressed. Pt voiced readiness for upcoming DC.  Patient demonstrates increased fall risk as noted by score of   37/56 on Berg Balance Scale.  (<36= high risk for falls, close to 100%; 37-45 significant >80%; 46-51 moderate >50%; 52-55 lower >25%)    P  PT, DPT 07/14/2020, 7:55 AM   

## 2020-07-13 NOTE — Discharge Summary (Addendum)
Physician Discharge Summary  Patient ID: Erica [name] MRN: [id number] DOB/AGE: Apr 30, 1956 65 y.o.  Admit date: 06/30/2020 Discharge date: 07/15/2020  Discharge Diagnoses:  Principal Problem:   Right thalamic infarction [facility]) Active Problems:   Diabetic peripheral neuropathy ([place])   Essential hypertension   AKI (acute kidney injury) ([place])   Ataxia, late effect of cerebrovascular disease   Labile blood glucose   Labile blood pressure   Anemia   Stage 3b chronic kidney disease (HCC)   Benign essential HTN   Pancytopenia (HCC) Mood stabilization COPD/asthma Hyperlipidemia History of cocaine use remote tobacco abuse GERD  Discharged Condition: Stable  Significant Diagnostic Studies: CT HEAD WO CONTRAST  Result Date: 06/25/2020 CLINICAL DATA:  Neuro deficit, acute, stroke suspected Left-sided weakness, fall. EXAM: CT HEAD WITHOUT CONTRAST TECHNIQUE: Contiguous axial images were obtained from the base of the skull through the vertex without intravenous contrast. COMPARISON:  Remote head CT 10/08/2010 FINDINGS: Brain: Brain volume is normal for age. There is a lacunar infarct in the right caudate as well as the right cerebellum, new from 2012 but age indeterminate. No evidence of large or territorial ischemia. No intracranial hemorrhage, mass effect, or midline shift. No hydrocephalus. The basilar cisterns are patent. No extra-axial or intracranial fluid collection. Vascular: Atherosclerosis of skullbase vasculature without hyperdense vessel or abnormal calcification. Skull: No fracture or focal lesion. Sinuses/Orbits: Mucosal thickening with bubbly debris and associated adjacent sclerosis of the left sphenoid sinus consistent chronic sinusitis. Remaining paranasal sinuses are clear. No mastoid effusion. Included orbits are unremarkable. Other: None. IMPRESSION: 1. Lacunar infarcts in the right caudate and right cerebellum, new from 2012, but age indeterminate. 2. No hemorrhage or evidence of  large or territorial ischemia. 3. Chronic left sphenoid sinusitis. Electronically Signed   By: Keith Rake M.D.   On: 06/25/2020 17:43   MR ANGIO HEAD WO CONTRAST  Result Date: 06/25/2020 CLINICAL DATA:  Neurological deficit.  Acute stroke suspected. EXAM: MRI HEAD WITHOUT CONTRAST MRA HEAD WITHOUT CONTRAST TECHNIQUE: Multiplanar, multiecho pulse sequences of the brain and surrounding structures were obtained without intravenous contrast. Angiographic images of the head were obtained using MRA technique without contrast. COMPARISON:  CT same day FINDINGS: MRI HEAD FINDINGS Brain: Diffusion imaging shows a 1 cm acute infarction in the anterolateral thalamus on the right, possibly with some involvement of the posterior limb internal capsule. Acute infarction within the right caudate head. Scattered punctate foci of high signal on diffusion imaging within the cerebral hemispheric white matter on both sides. These white matter lesions likely represent T2 shine through within old infarctions of the hemispheric white matter, but do not show low signal on the ADC map. No large confluent infarction. Chronic small-vessel ischemic changes affect the pons. There are numerous old small vessel cerebellar infarctions. Old small vessel infarctions affect both thalami, the basal ganglia regions in the cerebral hemispheric deep and subcortical white matter. Old small cortical infarctions noted in the parieto-occipital regions and right frontal region. No hemorrhage, mass lesion, hydrocephalus or extra-axial collection. Vascular: Major vessels at the base of the brain show flow. Skull and upper cervical spine: Negative Sinuses/Orbits: Clear/normal Other: None MRA HEAD FINDINGS Both internal carotid arteries widely patent through the skull base and siphon regions. No stenosis. The anterior and middle cerebral vessels are normal without evidence of stenosis or large or medium vessel occlusion. Both vertebral arteries are  widely patent to the basilar. Proximal basilar fenestration. No basilar stenosis. Posterior circulation branch vessels are patent. Mild atherosclerotic narrowing and irregularity  of the PCA branches. IMPRESSION: 1. 1 cm acute infarction in the anterolateral thalamus on the right, possibly with some involvement of the posterior limb internal capsule. Acute infarction within the right caudate head. Scattered other foci of high signal on diffusion imaging within the hemispheric white matter, not showing restricted diffusion on the ADC map, and therefore probably representing T2 shine through from old white matter infarctions. 2. Extensive old small vessel infarctions throughout the brain as outlined above. Small old cortical infarctions in the right frontal lobe and both parieto-occipital regions. 3. No large or medium vessel occlusion or correctable proximal stenosis. Mild atherosclerotic irregularity of the PCA branches. Electronically Signed   By: Nelson Chimes M.D.   On: 06/25/2020 20:56   MR Brain Wo Contrast (neuro protocol)  Result Date: 06/25/2020 CLINICAL DATA:  Neurological deficit.  Acute stroke suspected. EXAM: MRI HEAD WITHOUT CONTRAST MRA HEAD WITHOUT CONTRAST TECHNIQUE: Multiplanar, multiecho pulse sequences of the brain and surrounding structures were obtained without intravenous contrast. Angiographic images of the head were obtained using MRA technique without contrast. COMPARISON:  CT same day FINDINGS: MRI HEAD FINDINGS Brain: Diffusion imaging shows a 1 cm acute infarction in the anterolateral thalamus on the right, possibly with some involvement of the posterior limb internal capsule. Acute infarction within the right caudate head. Scattered punctate foci of high signal on diffusion imaging within the cerebral hemispheric white matter on both sides. These white matter lesions likely represent T2 shine through within old infarctions of the hemispheric white matter, but do not show low signal on  the ADC map. No large confluent infarction. Chronic small-vessel ischemic changes affect the pons. There are numerous old small vessel cerebellar infarctions. Old small vessel infarctions affect both thalami, the basal ganglia regions in the cerebral hemispheric deep and subcortical white matter. Old small cortical infarctions noted in the parieto-occipital regions and right frontal region. No hemorrhage, mass lesion, hydrocephalus or extra-axial collection. Vascular: Major vessels at the base of the brain show flow. Skull and upper cervical spine: Negative Sinuses/Orbits: Clear/normal Other: None MRA HEAD FINDINGS Both internal carotid arteries widely patent through the skull base and siphon regions. No stenosis. The anterior and middle cerebral vessels are normal without evidence of stenosis or large or medium vessel occlusion. Both vertebral arteries are widely patent to the basilar. Proximal basilar fenestration. No basilar stenosis. Posterior circulation branch vessels are patent. Mild atherosclerotic narrowing and irregularity of the PCA branches. IMPRESSION: 1. 1 cm acute infarction in the anterolateral thalamus on the right, possibly with some involvement of the posterior limb internal capsule. Acute infarction within the right caudate head. Scattered other foci of high signal on diffusion imaging within the hemispheric white matter, not showing restricted diffusion on the ADC map, and therefore probably representing T2 shine through from old white matter infarctions. 2. Extensive old small vessel infarctions throughout the brain as outlined above. Small old cortical infarctions in the right frontal lobe and both parieto-occipital regions. 3. No large or medium vessel occlusion or correctable proximal stenosis. Mild atherosclerotic irregularity of the PCA branches. Electronically Signed   By: Nelson Chimes M.D.   On: 06/25/2020 20:56   MR CERVICAL SPINE WO CONTRAST  Result Date: 06/26/2020 CLINICAL DATA:   Myelopathy EXAM: MRI CERVICAL SPINE WITHOUT CONTRAST TECHNIQUE: Multiplanar, multisequence MR imaging of the cervical spine was performed. No intravenous contrast was administered. COMPARISON:  None. FINDINGS: Alignment: Reversal of normal cervical lordosis may be positional or due to muscle spasm. No static subluxation. Vertebrae: No  fracture, evidence of discitis, or bone lesion. 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 The spinal cord is mildly atrophic. Posterior Fossa, vertebral arteries, paraspinal tissues: Negative. Disc levels: C1-2: Unremarkable. C2-3: Normal disc space and facet joints. There is no spinal canal stenosis. No neural foraminal stenosis. C3-4: Normal disc space and facet joints. There is no spinal canal stenosis. No neural foraminal stenosis. C4-5: Small central disc protrusion indenting the ventral spinal cord. There is no spinal canal stenosis. No neural foraminal stenosis. C5-6: Intermediate sized central disc protrusion indenting the ventral spinal cord. Mild spinal canal stenosis. No neural foraminal stenosis. C6-7: Intermediate sized central disc protrusion indenting the ventral spinal cord. Moderate spinal canal stenosis. No neural foraminal stenosis. C7-T1: Small central disc extrusion with inferior migration, contacting the ventral spinal cord. Mild spinal canal stenosis. No neural foraminal stenosis. IMPRESSION: 1. 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 Mild generalized atrophy of the upper cervical spinal cord. 2. Moderate C6-7 and mild C5-6 and C7-T1 spinal canal stenosis secondary to central disc protrusions. Electronically Signed   By: Ulyses Jarred M.D.   On: 06/26/2020 03:34   ECHOCARDIOGRAM COMPLETE  Result Date: 06/26/2020    ECHOCARDIOGRAM REPORT   Patient Name:   Erica [name] Date of Exam: 06/26/2020 Medical Rec #:   [id number]    Height:       61.0 in Accession #:    [id number]   Weight:       184.7 lb Date of Birth:  1955/11/14    BSA:          1.826 m Patient Age:    31 years     BP:           143/85 mmHg Patient Gender: F            HR:           68 bpm. Exam Location:  Inpatient Procedure: 2D Echo, Cardiac Doppler and Color Doppler Indications:    Stroke 434.91 / I163.9  History:        Patient has prior history of Echocardiogram examinations, most                 recent 07/16/2019. Risk Factors:Hypertension, Diabetes and GERD.  Sonographer:    Jonelle Sidle Dance Referring Phys: [id number] [place]  1. Left ventricular ejection fraction, by estimation, is 55 to 60%. The left ventricle has normal function. The left ventricle has no regional wall motion abnormalities. Left ventricular diastolic parameters are indeterminate.  2. Right ventricular systolic function is normal. The right ventricular size is normal.  3. The mitral valve is normal in structure. No evidence of mitral valve regurgitation.  4. The aortic valve is tricuspid. Aortic valve regurgitation is trivial. No aortic stenosis is present.  5. The inferior vena cava is normal in size with greater than 50% respiratory variability, suggesting right atrial pressure of 3 mmHg. Comparison(s): No prior Echocardiogram. There is no 07/16/19 study available in the system. FINDINGS  Left Ventricle: LV Mass Index 95 g/m2 within reference range; RWT 0.85 concentric remodeling. Left ventricular ejection fraction, by estimation, is 55 to 60%. The left ventricle has normal function. The left ventricle has no regional wall motion abnormalities. The left ventricular internal cavity size was small. There is borderline concentric left ventricular hypertrophy. Left ventricular diastolic parameters are indeterminate. Right Ventricle: The right ventricular size is  normal. No increase in right ventricular wall thickness. Right ventricular systolic function is normal. Left  Atrium: Left atrial size was normal in size. Right Atrium: Right atrial size was normal in size. Pericardium: There is no evidence of pericardial effusion. Mitral Valve: The mitral valve is normal in structure. No evidence of mitral valve regurgitation. Tricuspid Valve: The tricuspid valve is normal in structure. Tricuspid valve regurgitation is trivial. Aortic Valve: The aortic valve is tricuspid. Aortic valve regurgitation is trivial. No aortic stenosis is present. Pulmonic Valve: The pulmonic valve was not well visualized. Pulmonic valve regurgitation is not visualized. Aorta: The aortic root and ascending aorta are structurally normal, with no evidence of dilitation. Venous: The inferior vena cava is normal in size with greater than 50% respiratory variability, suggesting right atrial pressure of 3 mmHg. IAS/Shunts: The atrial septum is grossly normal.  LEFT VENTRICLE PLAX 2D LVIDd:         3.30 cm  Diastology LVIDs:         2.50 cm  LV e' medial:    3.26 cm/s LV PW:         1.60 cm  LV E/e' medial:  17.6 LV IVS:        1.40 cm  LV e' lateral:   5.00 cm/s LVOT diam:     1.90 cm  LV E/e' lateral: 11.5 LV SV:         49 LV SV Index:   27 LVOT Area:     2.84 cm  RIGHT VENTRICLE             IVC RV Basal diam:  1.90 cm     IVC diam: 1.50 cm RV S prime:     16.20 cm/s TAPSE (M-mode): 2.0 cm LEFT ATRIUM             Index       RIGHT ATRIUM           Index LA diam:        3.90 cm 2.14 cm/m  RA Area:     10.10 cm LA Vol (A2C):   35.3 ml 19.33 ml/m RA Volume:   16.40 ml  8.98 ml/m LA Vol (A4C):   40.0 ml 21.90 ml/m LA Biplane Vol: 38.8 ml 21.25 ml/m  AORTIC VALVE LVOT Vmax:   78.30 cm/s LVOT Vmean:  52.900 cm/s LVOT VTI:    0.172 m  AORTA Ao Root diam: 3.30 cm Ao Asc diam:  3.50 cm MITRAL VALVE MV Area (PHT): 3.12 cm    SHUNTS MV Decel Time: 243 msec    Systemic VTI:  0.17 m MV E velocity: 57.50 cm/s  Systemic Diam: 1.90 cm MV A velocity: 89.30 cm/s MV E/A ratio:  0.64 Rudean Haskell MD Electronically signed  by Rudean Haskell MD Signature Date/Time: 06/26/2020/11:56:54 AM    Final    VAS US CAROTID (at [facility] and [facility] only)  Result Date: 06/26/2020 Carotid Arterial Duplex Study Indications:       CVA. Risk Factors:      Hypertension, Diabetes. Comparison Study:  no prior Performing Technologist: Abram Sander RVS  Examination Guidelines: A complete evaluation includes B-mode imaging, spectral Doppler, color Doppler, and power Doppler as needed of all accessible portions of each vessel. Bilateral testing is considered an integral part of a complete examination. Limited examinations for reoccurring indications may be performed as noted.  Right Carotid Findings: +----------+--------+--------+--------+------------------+--------+           PSV cm/sEDV cm/sStenosisPlaque DescriptionComments +----------+--------+--------+--------+------------------+--------+  CCA Prox  62      12              heterogenous               +----------+--------+--------+--------+------------------+--------+ CCA Distal31      9               heterogenous               +----------+--------+--------+--------+------------------+--------+ ICA Prox  41      14      1-39%   heterogenous               +----------+--------+--------+--------+------------------+--------+ ICA Distal121     36                                         +----------+--------+--------+--------+------------------+--------+ ECA       40      6                                          +----------+--------+--------+--------+------------------+--------+ +----------+--------+-------+--------+-------------------+           PSV cm/sEDV cmsDescribeArm Pressure (mmHG) +----------+--------+-------+--------+-------------------+ [id number]                                         +----------+--------+-------+--------+-------------------+ +---------+--------+--+--------+-+---------+ VertebralPSV cm/s34EDV cm/s7Antegrade  +---------+--------+--+--------+-+---------+  Left Carotid Findings: +----------+--------+--------+--------+------------------+--------+           PSV cm/sEDV cm/sStenosisPlaque DescriptionComments +----------+--------+--------+--------+------------------+--------+ CCA Prox  32      9               heterogenous               +----------+--------+--------+--------+------------------+--------+ CCA Distal39      10              heterogenous               +----------+--------+--------+--------+------------------+--------+ ICA Prox  48      17      1-39%   heterogenous               +----------+--------+--------+--------+------------------+--------+ ICA Distal72      22                                         +----------+--------+--------+--------+------------------+--------+ ECA       52      6                                          +----------+--------+--------+--------+------------------+--------+ +----------+--------+--------+--------+-------------------+           PSV cm/sEDV cm/sDescribeArm Pressure (mmHG) +----------+--------+--------+--------+-------------------+ [id number]                                          +----------+--------+--------+--------+-------------------+ +---------+--------+--+--------+--+---------+ VertebralPSV cm/s46EDV cm/s12Antegrade +---------+--------+--+--------+--+---------+   Summary: Right Carotid: Velocities in the right ICA are consistent with a 1-39% stenosis. Left Carotid: Velocities  in the left ICA are consistent with a 1-39% stenosis. Vertebrals: Bilateral vertebral arteries demonstrate antegrade flow. *See table(s) above for measurements and observations.  Electronically signed by Antony Contras MD on 06/26/2020 at 1:20:06 PM.    Final     Labs:  Basic Metabolic Panel: Recent Labs  Lab 07/10/20 0748 07/14/20 0448  NA 142  --   K 3.5  --   CL 111  --   CO2 20*  --   GLUCOSE 149*  --   BUN 27*  --    CREATININE 1.78* 1.91*  CALCIUM 8.8*  --     CBC: Recent Labs  Lab 07/09/20 0505 07/10/20 0748 07/13/20 1224  WBC 3.4* 4.0 6.8  NEUTROABS 2.2 2.5  --   HGB 9.7* 9.7* 10.7*  HCT 27.5* 28.0* 30.6*  MCV 89.9 90.3 90.8  PLT 142* 130* 146*    CBG: Recent Labs  Lab 07/13/20 2104 07/14/20 0605 07/14/20 1127 07/14/20 1647 07/14/20 2121  GLUCAP 105* 143* 97 187* 150*   Family history. Father with hypertension CVA diabetes mellitus. Mother with CAD hypertension hyperlipidemia. Denies any esophageal cancer or rectal cancer  Brief HPI:   Erica [name] is a 65 y.o. right-handed female with history of hypertension rectal cancer hyperlipidemia diabetes mellitus COPD as well as remote cocaine use. Per chart review lives with her children 1 level home 4 steps to entry. Independent with assistive device. She has a sister who checks on her routinely. Presented 06/25/2020 with left-sided weakness slurred speech as well as a fall. Patient had reportedly recent episode of questionable syncope. CT/MRI showed a 1 cm acute infarction in the anterior lateral thalamus on the right with some involvement of the posterior limb internal capsule. Acute infarction within the right caudate head. Scattered other foci of high signal diffusion imaging within the hemispheric white matter. Extensive old small vessel infarctions throughout the brain. Patient did not receive TPA. MRA was unremarkable. 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 Moderate C6-7 and mild C5-6 and C7-T1 spinal canal stenosis secondary to central disc protrusions. Admission chemistries unremarkable except glucose 128 creatinine 1.90 SARS coronavirus negative and urine drug screen negative. Carotid Dopplers no ICA stenosis. Echocardiogram with ejection fraction of 55 to 60% no wall motion abnormalities. Maintained on Plavix and aspirin x3 weeks then Plavix alone. Lovenox for DVT  prophylaxis. Tolerating a regular diet. Due to patient's left side weakness slurred speech and decreased functional mobility she was admitted for a comprehensive rehab program.   Hospital Course: Erica [name] was admitted to rehab 06/30/2020 for inpatient therapies to consist of PT, ST and OT at least three hours five days a week. Past admission physiatrist, therapy team and rehab RN have worked together to provide customized collaborative inpatient rehab. Pertaining to patient's right thalamic and posterior limb internal capsule secondary small vessel disease remained stable she would follow-up with neurology services. Aspirin and Plavix x3 weeks total then Plavix alone. Initially on Lovenox for DVT prophylaxis noted some pancytopenia. Heparin-induced platelet antibodies negative. Pain managed with use of Neurontin scheduled. Mood stabilization with Lexapro BuSpar as well as Vistaril with emotional support provided. Neuropsychology was involved. Blood pressure monitored with Norvasc 10 mg daily Toprol as well as hydralazine. Blood sugars overall controlled hemoglobin A1c 5.2 insulin therapy as directed. Patient with history of COPD asthma maintained on inhalers. Hyperlipidemia with Lipitor as well as fenofibrate. She did have a history of cocaine remote tobacco  abuse urine drug screens were negative. GERD with Protonix ongoing. AKI /CKD latest creatinine 1.78.   Blood pressures were monitored on TID basis and soft and monitored  Diabetes has been monitored with ac/hs CBG checks and SSI was use prn for tighter BS control.    Rehab course: During patient's stay in rehab weekly team conferences were held to monitor patient's progress, set goals and discuss barriers to discharge. At admission, patient required minimal guard sit to stand minimal assist 120 feet rolling walker. Minimal assist upper body bathing max is lower body bathing mod assist upper body dressing max is lower body dressing  Physical exam.  Blood pressure 174/90 pulse 61 temperature 98.4 respirations 16 oxygen saturations 100% room air Constitutional. Mood was a bit flat HEENT Head. Normocephalic and atraumatic Eyes. Pupils round and reactive to light no discharge without nystagmus Neck. Supple nontender no JVD without thyromegaly Cardiac regular rate rhythm no extra sounds or murmur heard Abdomen. Soft nontender positive bowel sounds without rebound Respiratory effort normal no respiratory distress without wheeze Skin. Warm and dry Neurologic. No acute distress makes eye contact with examiner fair insight and awareness answers biographical questions oriented x3 and follows commands. Left face and left arm hand 1/2 sensation. Left lower extremity 1+/2 right upper right lower extremity 2/2. Motor 4+/5 right upper right lower extremity. Left upper extremity 1+ proximal to minus/5 distally. Left lower extremity 2 -/5 hip flexors knee extension and 1+/5 distally. DTRs 2+ left upper left lower extremity.    He/She  has had improvement in activity tolerance, balance, postural control as well as ability to compensate for deficits. He/She has had improvement in functional use RUE/LUE  and RLE/LLE as well as improvement in awareness. Patient completed bed mobility to edge of bed with supervision sit to stand with close supervision use of rolling walker. She ambulates around the room to gather items for showering. Minimal cues required for safety. Transferred onto bedside commode while shower heated up. She required minimal cueing and minimal assist for safety doffing lower body clothing. In the shower she was able to wash upper lower body thoroughly with set up assist with intermittent supervision. Socks donned edge of bed supervision. Pants donned x2 sit to stand rolling walker. She can ambulate up 175 feet with supervision rolling walker. Patient was able to complete with supervision level assist and demonstrated adequate attention and awareness  of errors and self correction of errors during evaluation of cognition. Full family teaching completed plan discharge to home       Disposition: Discharged to home    Diet: Carb modified  Special Instructions: No driving smoking or alcohol  Medications at discharge. 1. Tylenol as needed 2. Aspirin 81 mg p.o. daily x2 days and stop 3. Norvasc 10 mg p.o. daily 4. Lipitor 40 mg p.o. daily 5. BuSpar 10 mg p.o. daily 6. Plavix 75 mg p.o. daily 7. Voltaren gel 2 g 4 times a day to affected area 8. Lexapro 20 mg p.o. daily 9. Pepcid 20 mg p.o. daily 10. Fenofibrate 160 mg p.o. daily 11. Neurontin 300 mg p.o. 3 times daily 12. Hydralazine 25 mg p.o. every 8 hours 13. Hydroxyzine 25 mg p.o. twice daily 14.Vascepa 2 g p.o. twice daily 15. Toujeo insulin 12 units subcutaneous twice daily 16. Imodium as needed diarrhea 17. Toprol-XL 50 mg p.o. daily 18. Dulera 2 puffs twice daily 19. Protonix 40 mg p.o. daily 20. Ellipta 1 puff daily 21.Pirenidone 801 mg p.o. 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   Specialty: Physical Medicine and Rehabilitation Why: Office to call for appointment Contact information: [address] [place] [address] [phone]                 Signed: Cathlyn Parsons 07/15/2020, 5:10 AM Patient was seen, face-face, and physical exam performed by me on day of discharge, greater than 30 minutes of total time spent.. Please  see progress note from day of discharge as well.  Delice Lesch, MD, ABPMR

## 2020-07-13 NOTE — Progress Notes (Signed)
CBG at HS 394, patient reports eating [name] piece of pound cake. Erica [name]

## 2020-07-13 NOTE — Progress Notes (Signed)
[facility] PHYSICAL MEDICINE & REHABILITATION PROGRESS NOTE  Subjective/Complaints: Patient seen sitting up in bed this morning.  She states she slept well overnight.  She states she feels like there is something in her mouth.  She notes persistent numbness.  ROS: + Left facial and finger numbness.  Denies abd pain, CP, N/V/C/D   Objective: Vital Signs: Blood pressure (!) 155/75, pulse 73, temperature 98.2 F (36.8 C), resp. rate 17, height _0  (1.6 m), weight 86.6 kg, SpO2 94 %. No results found. No results for input(s): WBC, HGB, HCT, PLT in the last 72 hours. No results for input(s): NA, K, CL, CO2, GLUCOSE, BUN, CREATININE, CALCIUM in the last 72 hours.  Intake/Output Summary (Last 24 hours) at 07/13/2020 1157 Last data filed at 07/13/2020 0900 Gross per 24 hour  Intake 458 ml  Output --  Net 458 ml        Physical Exam: BP (!) 155/75   Pulse 73   Temp 98.2 F (36.8 C)   Resp 17   Ht _1  (1.6 m)   Wt 86.6 kg   SpO2 94%   BMI 33.82 kg/m  Constitutional: No distress . Vital signs reviewed. HENT: Normocephalic.  Atraumatic. Eyes: EOMI. No discharge. Cardiovascular: No JVD.  RRR. Respiratory: Normal effort.  No stridor.  Bilateral clear to auscultation. GI: Non-distended.  BS +. Skin: Warm and dry.  Intact. Psych: Flat affect, but improving.  Normal behavior. Musc: No edema in extremities.  No tenderness in extremities. Neuro: Alert Motor: RUE/RLE: 5/5 proximal distally in LUE: 4+/5 proximal distal, unchanged LLE: Hip flexion, knee extension 4+/5, ankle dorsiflexion 4+/5 with apraxia, unchanged Left-sided numbness, stable  Assessment/Plan: 1. Functional deficits which require 3+ hours per day of interdisciplinary therapy in a comprehensive inpatient rehab setting.  Physiatrist is providing close team supervision and 24 hour management of active medical problems listed below.  Physiatrist and rehab team continue to assess barriers to discharge/monitor patient  progress toward functional and medical goals   Care Tool:  Bathing    Body parts bathed by patient: Right arm,Left arm,Chest,Abdomen,Front perineal area,Buttocks,Right upper leg,Left upper leg,Face,Left lower leg,Right lower leg         Bathing assist Assist Level: Supervision/Verbal cueing     Upper Body Dressing/Undressing Upper body dressing   What is the patient wearing?: Bra,Pull over shirt    Upper body assist Assist Level: Independent    Lower Body Dressing/Undressing Lower body dressing      What is the patient wearing?: Pants,Underwear/pull up     Lower body assist Assist for lower body dressing: Supervision/Verbal cueing     Toileting Toileting    Toileting assist Assist for toileting: Independent with assistive device     Transfers Chair/bed transfer  Transfers assist     Chair/bed transfer assist level: Minimal Assistance - Patient > 75% Chair/bed transfer assistive device: Programmer, multimedia   Ambulation assist      Assist level: Minimal Assistance - Patient > 75% Assistive device: Walker-rolling Max distance: 100'   Walk 10 feet activity   Assist     Assist level: Contact Guard/Touching assist Assistive device: Walker-rolling   Walk 50 feet activity   Assist    Assist level: Contact Guard/Touching assist Assistive device: Walker-rolling    Walk 150 feet activity   Assist    Assist level: Contact Guard/Touching assist Assistive device: Walker-rolling    Walk 10 feet on uneven surface  activity   Assist Walk 10 feet  on uneven surfaces activity did not occur: Safety/medical concerns         Wheelchair     Assist Will patient use wheelchair at discharge?: No             Wheelchair 50 feet with 2 turns activity    Assist            Wheelchair 150 feet activity     Assist           Medical Problem List and Plan: 1. 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   Continue CIR 2. Antithrombotics: -DVT/anticoagulation:Lovenox on hold, see #15 -antiplatelet therapy: Aspirin 81 mg daily and Plavix 75 mg daily x3 weeks then Plavix alone 3. Pain Management:Neurontin 300 mg 3 times daily  Controlled with meds on 1/31  Monitor with increased mobility 4. Mood:Lexapro 10 mg daily, BuSpar 10 mg daily, Vistaril 25 mg twice daily  Increased Lexapro to 20 mg daily due to severe depression, appears to be improving  Neuropsych eval -antipsychotic agents: N/A 5. Neuropsych: This patientiscapable of making decisions on herown behalf. 6. Skin/Wound Care:Routine skin checks 7. Fluids/Electrolytes/Nutrition:Routine in and outs 8. Hypertension. Norvasc 10 mg daily, Toprol-XL 50 mg daily  Hydralazine 10 TID started on 1/26, increased on 1/27, increased again on 1/31 Vitals:   07/13/20 0805 07/13/20 0808  BP:  (!) 155/75  Pulse:  73  Resp:    Temp:    SpO2: 94%    9. Diabetes mellitus with peripheral neuropathy. Hemoglobin A1c 5.2.   Lantus insulin 25 units twice daily, decreased to 23 BID on 1/23, decreased to 20 BID on 1/25, decreased to 16 twice daily on 1/28, decreased to 12 on 1/29  Plan to initiate Novolog to stabilize CBGs after accommodated to decreased insulin   CBG (last 3)  Recent Labs    07/12/20 2100 07/13/20 0612 07/13/20 1138  GLUCAP 394* 94 102*   Extremely labile on 1/31, monitor for trend  Monitor with increased mobility  10. COPD/asthmaPirfenidone801 mg 3 times daily, Ellipta inhaler 1 puff daily, Dulera 2 puffs twice daily -Pirfenidone makes her nauseas, sometimes have diarrhea---treat supportively as she wants to stay on medication 11.  Hyperlipidemia: Lipitor, fenofibrate,Vascepa2 mg twice daily 12. History of cocaine use remote tobacco abuse. Urine drug screen negative 13. GERD. Protonix  1/22-  add Pepcid 20 mg daily per pharmacy dosing- since still having GERD Sx's.  14.?  AKI on CKD  Creatinine 1.78 on 1/28  Encourage fluids 15, Pancytopenia  ?  Pirfenidone  Cell lines stabilizing (fluctuates at baseline), except for platelets-HIT negative  Labs pending 16. Loose stools  1/23- due to Pirfenone for pulm fibrosis-trial imodium prn if bad- having 3-4 loose stools/day  LOS: 13 days A FACE TO FACE EVALUATION WAS PERFORMED  Roshad Hack Lorie Phenix 07/13/2020, 11:57 AM

## 2020-07-13 NOTE — Progress Notes (Signed)
Physical Therapy Session Note  Patient Details  Name: Erica [name] MRN: [id number] Date of Birth: 07/15/1955  Today's Date: 07/13/2020 PT Individual Time: 6759-1638 PT Individual Time Calculation (min): 55 min   Short Term Goals: Week 1:  PT Short Term Goal 1 (Week 1): Pt will complete bed mobility with supervision PT Short Term Goal 1 - Progress (Week 1): Met PT Short Term Goal 2 (Week 1): pt will complete bed<>chair transfers with supervision and LRAD PT Short Term Goal 3 (Week 1): Pt will ambulate 156f with CGA and LRAD PT Short Term Goal 3 - Progress (Week 1): Met PT Short Term Goal 4 (Week 1): Pt will navigate up/down at least 4 steps with minA and LRAD PT Short Term Goal 4 - Progress (Week 1): Met  Skilled Therapeutic Interventions/Progress Updates:    Pt received sleeping in bed, awakens easily to voice, reluctant but agreeable to therapy. No reports of pain. Flat affect throughout session. Supine<>sit mod I with bed features. She don's tennis shoes with setupA, requiring extra time for tieing her shoes. Sit<>stand mod I with RW and she ambulates from her room to main therapy gym, ~1776f with distant supervision and RW. Gait deficits include decreased gait speed, decreased L weight shift, effortful gait pattern but no knee buckling or LOB noted. After seated rest on mat table, performed TUG x3 trials with RW and distant supervision scoring an average of 42.33 seconds which is an improvement from her 61 second on evaluation. TUG score >13.5 seconds indicative of increased falls risk. She also completed 5xSTS x3 trials with supervision (and unsupported with arms across chest), scoring an average of 16.33 seconds.  5xSTS score > 15 seconds indicative of increased falls risk. Ambulated with supervision and RW inside // bars to perform NMR dynamic standing balance on blue air-ex pad. Performed feet together and feet apart on foam pad with fingtertip support on // bar. Instructed on floating  hands over bars to challenge balance and she was able to maintain unsupported standing on foam pad for ~10 second intervals. Incorporated thoracic rotations with 1kg med ball while on blue pad but patient had difficulty gripping the ball and also was fatigued from the balance exercises.   Sit>supine with supervision on mat table, although effortful. Performed the following supine there-ex: -2x10 bridges -2x10 heel slides, bilaterally -2x10 SLR, bilaterally -2x10 hip abduction, bilaterally  Supine<>sit with supervision from mat table. Ambulated back to her room with supervision and RW to EOB. Onset of fatigue after ~125101fPt requesting to remain seated EOB at end of session. She had needs in reach and bed alarm on at end of session.  Therapy Documentation Precautions:  Precautions Precautions: Fall Precaution Comments: left hemiparesis Restrictions Weight Bearing Restrictions: No   Therapy/Group: Individual Therapy  Kierre Hintz P Cassidi Modesitt PT 07/13/2020, 7:56 AM

## 2020-07-13 NOTE — Progress Notes (Signed)
Occupational Therapy Session Note  Patient Details  Name: Erica [name] MRN: [id number] Date of Birth: Jul 25, 1955  Today's Date: 07/13/2020 OT Individual Time: 1000-1058 OT Individual Time Calculation (min): 58 min    Short Term Goals: Week 2:  OT Short Term Goal 1 (Week 2): STGs = LTGs  Skilled Therapeutic Interventions/Progress Updates:    Pt semi upright in bed, no c/o pain, reports she washed up yesterday and would like to defer bathing today, but does need to use bathroom. Pt completed supine to sit with independence.  Pt used reacher with initial education on use to retrieve shoes sitting EOB and donned with mod I.  Pt ambulated to bathroom, completed toilet transfer using RW with distant supervision.  Toileting completed with mod I.  Pt ambulated to sink and washed hands with distant supervision.  Pt ambulated to ADL apartment using RW with supervision requiring one seated RB halfway.  VCs needed for safe approach to arm chair prior to stand to sit.  Issued pt a reacher bag and practiced using reacher to retrieve item impeding walk path with supervision.  Educated pt regarding compensatory techniques and enviornmental modifications in kitchen to increase safety and independence.  Discussed energy conservation techniques as well including planning and pacing.  Pt reports good understanding of all education.  Pt ambulated into kitchen and retrieved items placed in refrigerator and sink using RW with supervision.  Transported pt back to room for time mgt.  Pt ambulated 5 feet to EOB and stand to sit using RW with distant supervision.  Pt sitting EOB, call bell in reach, bed alarm on at end of session.    Therapy Documentation Precautions:  Precautions Precautions: Fall Precaution Comments: left hemiparesis Restrictions Weight Bearing Restrictions: No   Therapy/Group: Individual Therapy  Ezekiel Slocumb 07/13/2020, 10:17 AM

## 2020-07-13 NOTE — Progress Notes (Signed)
Speech Language Pathology Daily Session Note  Patient Details  Name: SAREN CORKERN MRN: [id number] Date of Birth: [birth year]  Today's Date: 07/13/2020 SLP Individual Time: 1102-1200 SLP Individual Time Calculation (min): 58 min  Short Term Goals: Week 2: SLP Short Term Goal 1 (Week 2): STG=LTG (ELOS: d/c 2/2)  Skilled Therapeutic Interventions:Skilled ST services focused on education and cognitive skills. SLP facilitated reassessment of cognitive skills utilizing highest level of assessment, CLQT. Pt demonstrated severe deficits in executive function, mild deficits in attention and [facility] for memory. Pt expressed continued deficits in problem solving and overall fatigue. SLP believes pt's executive function skills most likely reflect moderate deficits, based on pt's baseline more basic lifestyle, however pt was driving and managing complex task independently. SLP provided education pertaining to continue cognitive deficits and need for assistance with higher level task such as money/medication/time management. Pt was left in room with call bell within reach and bed alarm set. SLP recommends to continue skilled services.     Pain Pain Assessment Pain Score: 0-No pain  Therapy/Group: Individual Therapy  Marilea Gwynne  [facility] 07/13/2020, 4:57 PM

## 2020-07-13 NOTE — Consult Note (Signed)
Neuropsychological Consultation   Patient:   Erica [name]   DOB:   [birth year]  MR Number:  [id number]  Location:  [facility] [id number] [place] [id number] Dept: [place]: [phone]           Date of Service:   07/13/2020  Start Time:   9 AM End Time:   10 AM  Provider/Observer:  Ilean Skill, Psy.D.       Clinical Neuropsychologist       Billing Code/Service: [phone]  Chief Complaint:    Erica [name] is a 65 year old female with history of hypertension, rectal cancer, hyperlipidemia, diabetes, COPD as well as cocaine use.  Patient with past history of depression and anxiety with acute anxiety around the time of her stroke related to move into a ground-floor apartment.  Patient reported that she has not been recent to using cocaine but has done so in the past, however, she did have a positive urine drug screen for cocaine at admission.  Patient presented on 06/25/2020 with left-sided weakness, slurred speech as well as a recent fall.  Patient reports that she had been feeling very tired and fatigued on days prior and now looking back thinks that she may have been having stroke symptoms earlier.  Patient with for the recent episode of questionable syncope.  CT/MRI showed a 1 cm acute infarction in the anterior lateral thalamus on the right and some involvement of the posterior limb internal capsule.  Acute infarction within the right caudate head.  Scattered other foci of high signal diffusion imaging within the hemispheric white matter.  Extensive old small vessel infarctions throughout the brain noted.  Patient also showed abnormality cervical spine and canal stenosis secondary to central disc protrusions.  Due to patient's left-sided weakness and slurred speech physical medicine rehab consult was done and the patient was admitted for the comprehensive inpatient rehabilitation  program.  Reason for Service:  Patient was referred for neuropsychological consultation due to coping and adjustment issues related to recent CVA.  Below is the HPI for the current admission.  Erica [name] is a 65 year old right-handed female with history of hypertension, rectal cancer, hyperlipidemia diabetes mellitus, COPD as well as cocaine use. Per chart review lives with her children. 1 level home 4 steps to entry. Independent with assistive device. She has a sister who checks on her routinely. Presented 06/25/2020 with left-sided weakness slurred speech as well as a fall. Patient had reportedly recent episode of questionable syncope. CT/MRI showed a 1 cm acute infarction in the anterior lateral thalamus on the right with some involvement of the posterior limb internal capsule. Acute infarction within the right caudate head. Scattered other foci of high signal diffusion imaging within the hemispheric white matter. Extensive old small vessel infarctions throughout the brain. Patient did not receive tPA. MRA was unremarkable. 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 Moderate C6-7 and mild C5-6 and C7-T1 spinal canal stenosis secondary to central disc protrusions. Admission chemistries unremarkable except glucose 128 creatinine 1.90 SARS coronavirus negative urine drug screen negative. Carotid Dopplers with no ICA stenosis. Echocardiogram with ejection fraction of 55 to 60% no wall motion abnormalities. Maintained on Plavix and aspirin for CVA prophylaxis x3 weeks then Plavix alone. Lovenox for DVT prophylaxis. Tolerating a regular consistency diet. Due to patient's left-sided weakness and slurred speech physical medicine rehab consult was requested and patient  was admitted for a comprehensive rehab program.  Current Status:  Patient was alert and oriented with good mental status and cognition.  Patient spoke most  prominently of changes in somatosensory sensation from the left side of her face and left hand and some motor weakness.  Patient described events over the preceding day or 2 from her clear stroke event.  She describes significant stress as she was in the middle of a move from an apartment with multiple steps to a ground-floor apartment when these events happen.  Patient is getting assistance from family members to complete her move prior to discharge.  Patient reports that she has been somewhat anxious about a number of things that have been going on and it was a very stressful time for her prior to the stroke.  Patient denies any significant depressive symptomatology at this point and reports that her anxiety has been improving and they have been completing aspects of her move with help from others.  Patient reports good motivation for ongoing therapeutic involvement in efforts.  Behavioral Observation: Erica [name]  presents as a 65 y.o.-year-old Right African American Female who appeared her stated age. her dress was Appropriate and she was Well Groomed and her manners were Appropriate to the situation.  her participation was indicative of Appropriate and Redirectable behaviors.  There were physical disabilities noted.  she displayed an appropriate level of cooperation and motivation.     Interactions:    Active Appropriate and Redirectable  Attention:   abnormal and attention span appeared shorter than expected for age  Memory:   within normal limits; recent and remote memory intact  Visuo-spatial:  not examined  Speech (Volume):  normal  Speech:   normal; normal  Thought Process:  Coherent and Relevant  Though Content:  WNL; not suicidal and not homicidal  Orientation:   person, place, time/date and situation  Judgment:   Fair  Planning:   Poor  Affect:    Anxious  Mood:    Anxious  Insight:   Fair  Intelligence:   normal  Substance Use:  There is a documented history of  cocaine abuse confirmed by the patient.     Medical History:   Past Medical History:  Diagnosis Date  . Arthritis    especially in shoulders  . Asthma   . Depression   . Diabetes mellitus   . GERD (gastroesophageal reflux disease)   . Headache(784.0)    "mild"  . Hypertension   . Mental disorder    depression  . Neuropathy    feet   . Pain    arthritis pain - takes tramadol as needed  . Rectal polyp    very little bleeding with bowel movements- no pain  . Suicide attempt [facility])          Patient Active Problem List   Diagnosis Date Noted  . Benign essential HTN   . Pancytopenia ([place])   . Stage 3b chronic kidney disease ([place])   . Anemia   . Labile blood glucose   . Labile blood pressure   . Diabetic peripheral neuropathy ([place])   . Essential hypertension   . AKI (acute kidney injury) ([place])   . Ataxia, late effect of cerebrovascular disease   . 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[place]) 06/30/2020  . Acute CVA (cerebrovascular accident) ([place]) 06/26/2020  . CVA (cerebral vascular accident) ([place]) 06/25/2020  . BMI 32.0-32.9,adult 06/03/2020  . Left upper lobe pneumonia 03/30/2020  . Chest pain 10/01/2019  .  MDD (major depressive disorder), recurrent severe, without psychosis ([place]) 05/09/2019  . Suicide attempt ([place]) 05/09/2019  . Cocaine abuse ([place]) 05/09/2019  . Interstitial lung disease ([place]) 06/21/2016  . COPD (chronic obstructive pulmonary disease) ([place]) 06/21/2016  . Cough 06/21/2016  . GERD (gastroesophageal reflux disease) 06/21/2016  . Hypersomnia 06/21/2016  . Tobacco user 06/21/2016  . Rectal cancer ([place]) 01/30/2013     Psychiatric History:  Patient with past history of anxiety/depression with acute anxiety recently due to psychosocial stressors.  Patient with prior medications including Lexapro that are being continued currently.  Patient has prescription for BuSpar and trazodone as needed for sleep.  Family Med/Psych History:  Family History  Problem Relation  Age of Onset  . Hypertension Father   . Stroke Father   . Diabetes Father   . Heart disease Mother   . Hypertension Mother   . Hyperlipidemia Mother   . Hypertension Sister   . Hypertension Brother   . Hypertension Sister   . Hypertension Brother   . Cancer Other 47       colon cancer   . Cancer Maternal Aunt        cancer, unknown type   . Cancer Maternal Uncle        bone cancer   . Cancer Paternal Aunt        lung cancer  . Cancer Paternal Uncle        lung cancer  . Cancer Maternal Uncle        colon cancer and prostate cancer   . Cancer Maternal Uncle        prostate cancer     Impression/DX:  Erica [name] is a 65 year old female with history of hypertension, rectal cancer, hyperlipidemia, diabetes, COPD as well as cocaine use.  Patient with past history of depression and anxiety with acute anxiety around the time of her stroke related to move into a ground-floor apartment.  Patient reported that she has not been recent to using cocaine but has done so in the past, however, she did have a positive urine drug screen for cocaine at admission.  Patient presented on 06/25/2020 with left-sided weakness, slurred speech as well as a recent fall.  Patient reports that she had been feeling very tired and fatigued on days prior and now looking back thinks that she may have been having stroke symptoms earlier.  Patient with for the recent episode of questionable syncope.  CT/MRI showed a 1 cm acute infarction in the anterior lateral thalamus on the right and some involvement of the posterior limb internal capsule.  Acute infarction within the right caudate head.  Scattered other foci of high signal diffusion imaging within the hemispheric white matter.  Extensive old small vessel infarctions throughout the brain noted.  Patient also showed abnormality cervical spine and canal stenosis secondary to central disc protrusions.  Due to patient's left-sided weakness and slurred speech physical medicine  rehab consult was done and the patient was admitted for the comprehensive inpatient rehabilitation program.  Patient was alert and oriented with good mental status and cognition.  Patient spoke most prominently of changes in somatosensory sensation from the left side of her face and left hand and some motor weakness.  Patient described events over the preceding day or 2 from her clear stroke event.  She describes significant stress as she was in the middle of a move from an apartment with multiple steps to a ground-floor apartment when these events happen.  Patient is getting  assistance from family members to complete her move prior to discharge.  Patient reports that she has been somewhat anxious about a number of things that have been going on and it was a very stressful time for her prior to the stroke.  Patient denies any significant depressive symptomatology at this point and reports that her anxiety has been improving and they have been completing aspects of her move with help from others.  Patient reports good motivation for ongoing therapeutic involvement in efforts.   Disposition/Plan:  Today we worked on coping and adjustment issues and assessed her level of anxiety and depression.  Patient denies any severe depression at this point and feels like her current medication regimen is helping.  Patient does acknowledge acute worsening of her anxiety symptoms prior to her stroke reports that neither her anxiety or depression are limiting her ability to fully participate in therapeutic efforts.  Diagnosis:    Right thalamic infarction [facility]) - Plan: Ambulatory referral to Neurology, Ambulatory referral to Physical Medicine Rehab         Electronically Signed   _______________________ Ilean Skill, Psy.D. Clinical Neuropsychologist

## 2020-07-14 LAB — GLUCOSE, CAPILLARY
Glucose-Capillary: 143 mg/dL — ABNORMAL HIGH (ref 70–99)
Glucose-Capillary: 97 mg/dL (ref 70–99)
Glucose-Capillary: 187 mg/dL — ABNORMAL HIGH (ref 70–99)
Glucose-Capillary: 150 mg/dL — ABNORMAL HIGH (ref 70–99)

## 2020-07-14 LAB — CREATININE, SERUM
Creatinine, Ser: 1.91 mg/dL — ABNORMAL HIGH (ref 0.44–1.00)
GFR, Estimated: 29 mL/min — ABNORMAL LOW (ref >60)

## 2020-07-14 MED ORDER — DICLOFENAC SODIUM 1 % EX GEL: 2.0000 g | Freq: Four times a day (QID) | CUTANEOUS | 0 refills | Status: DC

## 2020-07-14 MED ORDER — ACETAMINOPHEN 325 MG PO TABS: 650.0000 mg | ORAL_TABLET | ORAL | Status: DC | PRN

## 2020-07-14 MED ORDER — METOPROLOL SUCCINATE ER 50 MG PO TB24: ORAL_TABLET | ORAL | 3 refills | Status: DC

## 2020-07-14 MED ORDER — INSULIN GLARGINE 100 UNIT/ML SOLOSTAR PEN: 12.0000 [IU] | PEN_INJECTOR | Freq: Two times a day (BID) | SUBCUTANEOUS | 11 refills | Status: DC

## 2020-07-14 MED ORDER — FAMOTIDINE 20 MG PO TABS: 20.0000 mg | ORAL_TABLET | Freq: Every day | ORAL | 0 refills | Status: DC

## 2020-07-14 MED ORDER — BUSPIRONE HCL 10 MG PO TABS: 10.0000 mg | ORAL_TABLET | Freq: Every day | ORAL | 0 refills | Status: DC

## 2020-07-14 MED ORDER — ESCITALOPRAM OXALATE 20 MG PO TABS: 20.0000 mg | ORAL_TABLET | Freq: Every day | ORAL | 0 refills | Status: DC

## 2020-07-14 MED ORDER — ICOSAPENT ETHYL 1 G PO CAPS: 2.0000 g | ORAL_CAPSULE | Freq: Two times a day (BID) | ORAL | 0 refills | Status: DC

## 2020-07-14 MED ORDER — HYDRALAZINE HCL 25 MG PO TABS: 37.5000 mg | ORAL_TABLET | Freq: Three times a day (TID) | ORAL | 0 refills | Status: DC

## 2020-07-14 MED ORDER — ALBUTEROL SULFATE HFA 108 (90 BASE) MCG/ACT IN AERS: 2.0000 | INHALATION_SPRAY | RESPIRATORY_TRACT | 5 refills | Status: DC | PRN

## 2020-07-14 MED ORDER — HYDROXYZINE HCL 25 MG PO TABS: 25.0000 mg | ORAL_TABLET | Freq: Two times a day (BID) | ORAL | 0 refills | Status: DC

## 2020-07-14 MED ORDER — TOUJEO SOLOSTAR 300 UNIT/ML ~~LOC~~ SOPN: 12.0000 [IU] | PEN_INJECTOR | Freq: Two times a day (BID) | SUBCUTANEOUS | 1 refills | Status: DC

## 2020-07-14 MED ORDER — FENOFIBRATE 145 MG PO TABS: ORAL_TABLET | ORAL | 1 refills | Status: DC

## 2020-07-14 MED ORDER — ATORVASTATIN CALCIUM 40 MG PO TABS: 40.0000 mg | ORAL_TABLET | Freq: Every day | ORAL | 0 refills | Status: DC

## 2020-07-14 MED ORDER — AMLODIPINE BESYLATE 10 MG PO TABS: 10.0000 mg | ORAL_TABLET | Freq: Every day | ORAL | 2 refills | Status: DC

## 2020-07-14 MED ORDER — ASPIRIN 81 MG PO TBEC: 81.0000 mg | DELAYED_RELEASE_TABLET | Freq: Every day | ORAL | 11 refills | Status: DC

## 2020-07-14 MED ORDER — CLOPIDOGREL BISULFATE 75 MG PO TABS: 75.0000 mg | ORAL_TABLET | Freq: Every day | ORAL | 1 refills | Status: DC

## 2020-07-14 MED ORDER — GABAPENTIN 300 MG PO CAPS: 300.0000 mg | ORAL_CAPSULE | Freq: Three times a day (TID) | ORAL | 0 refills | Status: DC

## 2020-07-14 MED ORDER — LOPERAMIDE HCL 2 MG PO CAPS: 2.0000 mg | ORAL_CAPSULE | ORAL | 0 refills | Status: DC | PRN

## 2020-07-14 NOTE — Progress Notes (Signed)
[facility] PHYSICAL MEDICINE & REHABILITATION PROGRESS NOTE  Subjective/Complaints: Patient seen standing at the sink this morning with a rolling walker.  She states she slept well overnight.  She notes persistent numbness, discussed again with patient.  ROS: + Left facial and finger numbness, unchanged.  Denies abd pain, CP, N/V/C/D   Objective: Vital Signs: Blood pressure (!) 150/74, pulse 64, temperature 97.9 F (36.6 C), temperature source Oral, resp. rate 16, height _0  (1.6 m), weight 86.6 kg, SpO2 98 %. No results found. Recent Labs    07/13/20 1224  WBC 6.8  HGB 10.7*  HCT 30.6*  PLT 146*   Recent Labs    07/14/20 0448  CREATININE 1.91*    Intake/Output Summary (Last 24 hours) at 07/14/2020 0902 Last data filed at 07/14/2020 0734 Gross per 24 hour  Intake 720 ml  Output --  Net 720 ml        Physical Exam: BP (!) 150/74   Pulse 64   Temp 97.9 F (36.6 C) (Oral)   Resp 16   Ht _1  (1.6 m)   Wt 86.6 kg   SpO2 98%   BMI 33.82 kg/m  Constitutional: No distress . Vital signs reviewed. HENT: Normocephalic.  Atraumatic. Eyes: EOMI. No discharge. Cardiovascular: No JVD.  RRR. Respiratory: Normal effort.  No stridor.  Bilateral clear to auscultation. GI: Non-distended.  BS +. Skin: Warm and dry.  Intact. Psych: Flat, but improving affect.  Normal behavior. Musc: No edema in extremities.  No tenderness in extremities. Neuro: Alert Motor: RUE/RLE: 5/5 proximal distally in LUE: 4+/5 proximal distal, stable LLE: Hip flexion, knee extension 4+/5, ankle dorsiflexion 4+/5 with apraxia, stable Left-sided numbness, stable  Assessment/Plan: 1. Functional deficits which require 3+ hours per day of interdisciplinary therapy in a comprehensive inpatient rehab setting.  Physiatrist is providing close team supervision and 24 hour management of active medical problems listed below.  Physiatrist and rehab team continue to assess barriers to discharge/monitor patient  progress toward functional and medical goals   Care Tool:  Bathing    Body parts bathed by patient: Right arm,Left arm,Chest,Abdomen,Front perineal area,Buttocks,Right upper leg,Left upper leg,Face,Left lower leg,Right lower leg         Bathing assist Assist Level: Supervision/Verbal cueing     Upper Body Dressing/Undressing Upper body dressing   What is the patient wearing?: Bra,Pull over shirt    Upper body assist Assist Level: Independent    Lower Body Dressing/Undressing Lower body dressing      What is the patient wearing?: Pants,Underwear/pull up     Lower body assist Assist for lower body dressing: Supervision/Verbal cueing     Toileting Toileting    Toileting assist Assist for toileting: Independent with assistive device     Transfers Chair/bed transfer  Transfers assist     Chair/bed transfer assist level: Minimal Assistance - Patient > 75% Chair/bed transfer assistive device: Programmer, multimedia   Ambulation assist      Assist level: Minimal Assistance - Patient > 75% Assistive device: Walker-rolling Max distance: 100'   Walk 10 feet activity   Assist     Assist level: Contact Guard/Touching assist Assistive device: Walker-rolling   Walk 50 feet activity   Assist    Assist level: Contact Guard/Touching assist Assistive device: Walker-rolling    Walk 150 feet activity   Assist    Assist level: Contact Guard/Touching assist Assistive device: Walker-rolling    Walk 10 feet on uneven surface  activity  Assist Walk 10 feet on uneven surfaces activity did not occur: Safety/medical concerns         Wheelchair     Assist Will patient use wheelchair at discharge?: No             Wheelchair 50 feet with 2 turns activity    Assist            Wheelchair 150 feet activity     Assist           Medical Problem List and Plan: 1. 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   Continue CIR, patient and family education 2. Antithrombotics: -DVT/anticoagulation:Lovenox on hold, see #15 -antiplatelet therapy: Aspirin 81 mg daily and Plavix 75 mg daily x3 weeks then Plavix alone 3. Pain Management:Neurontin 300 mg 3 times daily  Controlled with meds on 2/1  Monitor with increased mobility 4. Mood:Lexapro 10 mg daily, BuSpar 10 mg daily, Vistaril 25 mg twice daily  Increased Lexapro to 20 mg daily due to severe depression, appears to be improving  Neuropsych eval -antipsychotic agents: N/A 5. Neuropsych: This patientiscapable of making decisions on herown behalf. 6. Skin/Wound Care:Routine skin checks 7. Fluids/Electrolytes/Nutrition:Routine in and outs 8. Hypertension. Norvasc 10 mg daily, Toprol-XL 50 mg daily  Hydralazine 10 TID started on 1/26, increased on 1/27, increased again on 1/31 Vitals:   07/14/20 0826 07/14/20 0827  BP:    Pulse:    Resp:    Temp:    SpO2: 99% 98%   Slightly elevated on 2/1 9. Diabetes mellitus with peripheral neuropathy. Hemoglobin A1c 5.2.   Lantus insulin 25 units twice daily, decreased to 23 BID on 1/23, decreased to 20 BID on 1/25, decreased to 16 twice daily on 1/28, decreased to 12 on 1/29  Plan to initiate Novolog to stabilize CBGs after accommodated to decreased insulin   CBG (last 3)  Recent Labs    07/13/20 1642 07/13/20 2104 07/14/20 0605  GLUCAP 208* 105* 143*   Slightly labile on 2/1  Monitor with increased mobility  10. COPD/asthmaPirfenidone801 mg 3 times daily, Ellipta inhaler 1 puff daily, Dulera 2 puffs twice daily -Pirfenidone makes her nauseas, sometimes have diarrhea---treat supportively as she wants to stay on medication 11.  Hyperlipidemia: Lipitor, fenofibrate,Vascepa2 mg twice daily 12. History of cocaine use remote tobacco abuse. Urine drug screen negative 13.  GERD. Protonix  1/22- add Pepcid 20 mg daily per pharmacy dosing- since still having GERD Sx's.  14.?  AKI on CKD  Creatinine 1.91 on 2/1  Encourage fluids 15, Pancytopenia  ?  Pirfenidone  Cell lines stabilizing (fluctuates at baseline), improving on 1/31  HIT negative 16. Loose stools  1/23- due to Pirfenone for pulm fibrosis-trial imodium prn if bad- having 3-4 loose stools/day  LOS: 14 days A FACE TO FACE EVALUATION WAS PERFORMED  Aianna Fahs Lorie Phenix 07/14/2020, 9:02 AM

## 2020-07-14 NOTE — Discharge Instructions (Signed)
Inpatient Rehab Discharge Instructions  MARGEL JOENS Discharge date and time: No discharge date for patient encounter.   Activities/Precautions/ Functional Status: Activity: activity as tolerated Diet: regular diet Wound Care: Routine skin checks Functional status:  ___ No restrictions     ___ Walk up steps independently ___ 24/7 supervision/assistance   ___ Walk up steps with assistance ___ Intermittent supervision/assistance  ___ Bathe/dress independently ___ Walk with walker     _x__ Bathe/dress with assistance ___ Walk Independently    ___ Shower independently ___ Walk with assistance    ___ Shower with assistance ___ No alcohol     ___ Return to work/school ________  Special Instructions: No driving smoking or alcohol  Continue aspirin 81 mg daily x2 more days and stop   COMMUNITY REFERRALS UPON DISCHARGE:    Home Health:   PT, OT, SP                   Agency: [facility], [address] [facility]                                                 Agency/Supplier: [facility]  [phone]   STROKE/TIA DISCHARGE INSTRUCTIONS SMOKING Cigarette smoking nearly doubles your risk of having a stroke & is the single most alterable risk factor  If you smoke or have smoked in the last 12 months, you are advised to quit smoking for your health.  Most of the excess cardiovascular risk related to smoking disappears within a year of stopping.  Ask you doctor about anti-smoking medications  [place] Quit Line: 1-800-QUIT NOW  Free Smoking Cessation Classes [phone]  CHOLESTEROL Know your levels; limit fat & cholesterol in your diet  Lipid Panel     Component Value Date/Time   CHOL 155 06/26/2020 0527   CHOL 147 04/23/2020 1354   TRIG 130 06/26/2020 0527   HDL 43 06/26/2020 0527   HDL 50 04/23/2020 1354   CHOLHDL 3.6 06/26/2020 0527   VLDL 26 06/26/2020 0527   LDLCALC 86 06/26/2020 0527   LDLCALC 81  06/25/2020 1525      Many patients benefit from treatment even if their cholesterol is at goal.  Goal: Total Cholesterol (CHOL) less than 160  Goal:  Triglycerides (TRIG) less than 150  Goal:  HDL greater than 40  Goal:  LDL (LDLCALC) less than 100   BLOOD PRESSURE American Stroke Association blood pressure target is less that 120/80 mm/Hg  Your discharge blood pressure is:     Monitor your blood pressure  Limit your salt and alcohol intake  Many individuals will require more than one medication for high blood pressure  DIABETES (A1c is a blood sugar average for last 3 months) Goal HGBA1c is under 7% (HBGA1c is blood sugar average for last 3 months)  Diabetes: No known diagnosis of diabetes    Lab Results  Component Value Date   HGBA1C 5.2 06/26/2020     Your HGBA1c can be lowered with medications, healthy diet, and exercise.  Check your blood sugar as directed by your physician  Call your physician if you experience unexplained or low blood sugars.  PHYSICAL ACTIVITY/REHABILITATION Goal is 30 minutes at least 4 days per week  Activity: Increase activity slowly, Therapies:  Return to work:   Activity  decreases your risk of heart attack and stroke and makes your heart stronger.  It helps control your weight and blood pressure; helps you relax and can improve your mood.  Participate in a regular exercise program.  Talk with your doctor about the best form of exercise for you (dancing, walking, swimming, cycling).  DIET/WEIGHT Goal is to maintain a healthy weight  Your discharge diet is:  Diet Order            Diet regular Room service appropriate? Yes; Fluid consistency: Thin  Diet effective now                 liquids Your height is:    Your current weight is:   Your Body Mass Index (BMI) is:     Following the type of diet specifically designed for you will help prevent another stroke.  Your goal weight range is:    Your goal Body Mass Index (BMI) is  19-24.  Healthy food habits can help reduce 3 risk factors for stroke:  High cholesterol, hypertension, and excess weight.  RESOURCES Stroke/Support Group:  Call [phone]   STROKE EDUCATION PROVIDED/REVIEWED AND GIVEN TO PATIENT Stroke warning signs and symptoms How to activate emergency medical system (call 911). Medications prescribed at discharge. Need for follow-up after discharge. Personal risk factors for stroke. Pneumonia vaccine given:  Flu vaccine given:  My questions have been answered, the writing is legible, and I understand these instructions.  I will adhere to these goals & educational materials that have been provided to me after my discharge from the hospital.      My questions have been answered and I understand these instructions. I will adhere to these goals and the provided educational materials after my discharge from the hospital.  Patient/Caregiver Signature _______________________________ Date __________  Clinician Signature _______________________________________ Date __________  Please bring this form and your medication list with you to all your follow-up doctor's appointments.

## 2020-07-14 NOTE — Progress Notes (Signed)
Speech Language Pathology Discharge Summary  Patient Details  Name: Erica [name] MRN: [id number] Date of Birth: 02/02/1956  Today's Date: 07/14/2020 SLP Individual Time: 1033-1115 SLP Individual Time Calculation (min): 42 min   Skilled Therapeutic Interventions: Skilled ST services focused on education and cognitive skills. SLP facilitated continued reassessment of cognitive skills administering subsections Cognistat to better reflect pt's baseline responsibilities. Pt scored severe impairment in construction task (mildly complex problem solving) with deficits in identifying multiple solutions during problem solving. The other subsections short term recall, calculation, abstract language and judgement were al within mild range. SLP provided education to pt and pt's sister via phone, pertaining to the need for assistance in higher level cognitive tasks. Handout for memory strategies was provided and all questions answered to satisfaction. Pt was left in room with call bell within reach and chair alarm set.    Patient has met 2 of 3 long term goals.  Patient to discharge at overall Min;Supervision level.  Reasons goals not met: formal assessment reflected less than mod I skill level for higher level problem solving   Clinical Impression/Discharge Summary:   Pt met 2 out 3 goals, discharging at min-supervision A. Pt made improvement in sustained attention, short term recall and mildly complex-complex problem solving skills in functional task such as medication and money management. Pt demonstrated continued deficits especially in areas of advanced problem solving scoring severe on both the construction task (measuring mildly complex problem solving)from the Cognistat and on the CLQT executive function (measure complex problem solving.) Education was completed with pt's sister and handout given for memory strategies. Pt benefited from skilled ST services in order to maximize functional independence and  reduce burden of care, requiring supervision with higher level cognitive needs and continued skilled ST services.  Care Partner:  Caregiver Able to Provide Assistance: Yes  Type of Caregiver Assistance: Physical;Cognitive  Recommendation:  Outpatient SLP;Home Health SLP      Equipment: N/A   Reasons for discharge: Discharged from hospital   Patient/Family Agrees with Progress Made and Goals Achieved: Yes       07/14/2020, 4:08 PM    

## 2020-07-14 NOTE — Progress Notes (Signed)
Occupational Therapy Discharge Summary  Patient Details  Name: Erica [name] MRN: [id number] Date of Birth: 07-07-1955  Today's Date: 07/14/2020 OT Individual Time: 0900-1000 OT Individual Time Calculation (min): 60 min    Patient has met 10 of 10 long term goals due to improved activity tolerance, improved balance, ability to compensate for deficits, functional use of  LEFT upper and LEFT lower extremity, improved awareness and improved coordination.  Patient to discharge at overall Modified Independent level for ADLs and supervision for IADLs.  Patient's care partner is independent to provide the necessary cognitive assistance at discharge.    Recommendation:  Patient will benefit from ongoing skilled OT services in home health setting to continue to advance functional skills in the area of BADL.  Equipment: BSC, tub bench, and RW  Reasons for discharge: treatment goals met and discharge from hospital   Skilled Intervention:  Pt semi upright in bed, reports no pain, requesting to wash up at sink this AM.  Pt retrieved all ADL items at ambulation level using RW and walker bag and returned to sink and stand to sit with mod I.  Pt completed oral hygiene, dressed, and bathed sitting/standing at sink with mod I and increased time.  Pt returned to recliner at end of session.  OT assessed LUE strength and fine motor control per below findings.  Call bell in reach, seat alarm on.  Patient/family agrees with progress made and goals achieved: Yes  OT Discharge Precautions/Restrictions  Precautions Precautions: Fall Precaution Comments: Mild L hemiparesis Restrictions Weight Bearing Restrictions: No Pain Pain Assessment Pain Scale: 0-10 Pain Score: 0-No pain ADL ADL Equipment Provided: Other (comment) (walker and reacher bag) Eating: Independent Where Assessed-Eating: Chair Grooming: Modified independent Where Assessed-Grooming: Standing at sink Upper Body Bathing: Modified  independent Where Assessed-Upper Body Bathing: Shower Lower Body Bathing: Modified independent Where Assessed-Lower Body Bathing: Shower (sitting on tub bench) Upper Body Dressing: Modified independent (Device) Where Assessed-Upper Body Dressing: Sitting at sink Lower Body Dressing: Modified independent Where Assessed-Lower Body Dressing: Sitting at sink,Standing at sink Toileting: Modified independent Where Assessed-Toileting: Glass blower/designer: Diplomatic Services operational officer Method: Human resources officer: Modified independent Clinical cytogeneticist Method: Optometrist: Gaffer Baseline Vision/History: Wears glasses Wears Glasses: Reading only Patient Visual Report: No change from baseline Vision Assessment?: No apparent visual deficits Perception  Perception: Within Functional Limits Praxis Praxis: Intact Cognition Overall Cognitive Status: Impaired/Different from baseline Arousal/Alertness: Awake/alert Orientation Level: Oriented X4 Attention: Focused Focused Attention: Appears intact Memory: Impaired Memory Impairment: Decreased short term memory;Decreased recall of new information Decreased Short Term Memory: Verbal complex;Functional complex Awareness: Appears intact Problem Solving: Impaired Problem Solving Impairment: Functional complex Reasoning: Appears intact Organizing: Appears intact Decision Making: Appears intact Safety/Judgment: Appears intact Sensation Sensation Light Touch: Impaired by gross assessment (Pt reports left sided numbness in face and hand) Hot/Cold: Appears Intact Proprioception: Appears Intact Stereognosis: Not tested Coordination Gross Motor Movements are Fluid and Coordinated: No Fine Motor Movements are Fluid and Coordinated: No Finger Nose Finger Test: LUE much improved since IE; WFL Motor  Motor Motor: Hemiplegia Motor - Discharge Observations: Mild L hemiparesis, much  improve since date of evalulation Mobility  Bed Mobility Rolling Right: Independent Rolling Left: Independent Supine to Sit: Independent Sit to Supine: Independent Transfers Sit to Stand: Independent with assistive device Stand to Sit: Independent with assistive device  Trunk/Postural Assessment  Cervical Assessment Cervical Assessment: Within Functional Limits Thoracic Assessment Thoracic Assessment: Within Functional Limits Lumbar Assessment Lumbar Assessment: Within  Functional Limits Postural Control Postural Control: Within Functional Limits  Balance Balance Balance Assessed: Yes Static Sitting Balance Static Sitting - Balance Support: Feet supported;No upper extremity supported Static Sitting - Level of Assistance: 7: Independent Dynamic Sitting Balance Dynamic Sitting - Balance Support: Feet supported;No upper extremity supported Dynamic Sitting - Level of Assistance: 6: Modified independent (Device/Increase time) Static Standing Balance Static Standing - Balance Support: Bilateral upper extremity supported Static Standing - Level of Assistance: 6: Modified independent (Device/Increase time) Dynamic Standing Balance Dynamic Standing - Balance Support: Right upper extremity supported Dynamic Standing - Level of Assistance: 6: Modified independent (Device/Increase time) Extremity/Trunk Assessment RUE Assessment RUE Assessment: Within Functional Limits LUE Assessment LUE Assessment: Within Functional Limits General Strength Comments: Grossly 4/5   Erica [name] L Erica [name] 07/14/2020, 9:18 AM

## 2020-07-15 LAB — GLUCOSE, CAPILLARY: Glucose-Capillary: 100 mg/dL — ABNORMAL HIGH (ref 70–99)

## 2020-07-15 MED ORDER — BLOOD GLUCOSE METER KIT: PACK | 0 refills | Status: DC

## 2020-07-15 NOTE — Progress Notes (Signed)
[place] PHYSICAL MEDICINE & REHABILITATION PROGRESS NOTE  Subjective/Complaints: Patient seen laying in bed this AM.  She states she slept well overnight.  She is ready for discharge.   ROS: + Left facial and finger numbness, stable. Denies abd pain, CP, N/V/C/D   Objective: Vital Signs: Blood pressure (!) 164/80, pulse (!) 56, temperature 97.6 F (36.4 C), resp. rate 18, height _0  (1.6 m), weight 86.6 kg, SpO2 94 %. No results found. Recent Labs    07/13/20 1224  WBC 6.8  HGB 10.7*  HCT 30.6*  PLT 146*   Recent Labs    07/14/20 0448  CREATININE 1.91*    Intake/Output Summary (Last 24 hours) at 07/15/2020 1112 Last data filed at 07/15/2020 0837 Gross per 24 hour  Intake 118 ml  Output --  Net 118 ml        Physical Exam: BP (!) 164/80   Pulse (!) 56   Temp 97.6 F (36.4 C)   Resp 18   Ht _1  (1.6 m)   Wt 86.6 kg   SpO2 94%   BMI 33.82 kg/m  Constitutional: No distress . Vital signs reviewed. HENT: Normocephalic.  Atraumatic. Eyes: EOMI. No discharge. Cardiovascular: No JVD.  RRR. Respiratory: Normal effort.  No stridor.  Bilateral clear to auscultation. GI: Non-distended.  BS +. Skin: Warm and dry.  Intact. Psych: Flat, but improving affect. Normal behavior. Musc: No edema in extremities.  No tenderness in extremities. Neuro: Alert Motor: RUE/RLE: 5/5 proximal distally in LUE: 4+/5 proximal distal, stable LLE: Hip flexion, knee extension 4+/5, ankle dorsiflexion 4+/5 with apraxia, unchanged Left-sided numbness, stable  Assessment/Plan: 1. Functional deficits which require 3+ hours per day of interdisciplinary therapy in a comprehensive inpatient rehab setting.  Physiatrist is providing close team supervision and 24 hour management of active medical problems listed below.  Physiatrist and rehab team continue to assess barriers to discharge/monitor patient progress toward functional and medical goals   Care Tool:  Bathing    Body parts  bathed by patient: Right arm,Left arm,Chest,Abdomen,Front perineal area,Buttocks,Right upper leg,Left upper leg,Face,Left lower leg,Right lower leg         Bathing assist Assist Level: Independent with assistive device     Upper Body Dressing/Undressing Upper body dressing   What is the patient wearing?: Bra,Pull over shirt    Upper body assist Assist Level: Independent    Lower Body Dressing/Undressing Lower body dressing      What is the patient wearing?: Pants,Underwear/pull up     Lower body assist Assist for lower body dressing: Independent with assitive device     Toileting Toileting    Toileting assist Assist for toileting: Independent with assistive device     Transfers Chair/bed transfer  Transfers assist     Chair/bed transfer assist level: Independent with assistive device Chair/bed transfer assistive device: Programmer, multimedia   Ambulation assist      Assist level: Independent with assistive device Assistive device: Walker-rolling Max distance: 150   Walk 10 feet activity   Assist     Assist level: Independent with assistive device Assistive device: Walker-rolling   Walk 50 feet activity   Assist    Assist level: Independent with assistive device Assistive device: Walker-rolling    Walk 150 feet activity   Assist    Assist level: Independent with assistive device Assistive device: Walker-rolling    Walk 10 feet on uneven surface  activity   Assist Walk 10 feet on uneven surfaces activity  did not occur: Safety/medical concerns   Assist level: Supervision/Verbal cueing Assistive device: Aeronautical engineer Will patient use wheelchair at discharge?: No             Wheelchair 50 feet with 2 turns activity    Assist            Wheelchair 150 feet activity     Assist           Medical Problem List and Plan: 1. 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   DC today  Will see patient for hospital follow up in 1 month post-discharge 2. Antithrombotics: -DVT/anticoagulation:Lovenox on hold, see #15 -antiplatelet therapy: Aspirin 81 mg daily and Plavix 75 mg daily x3 weeks then Plavix alone 3. Pain Management:Neurontin 300 mg 3 times daily  Controlled with meds on 2/2  Monitor with increased mobility 4. Mood:Lexapro 10 mg daily, BuSpar 10 mg daily, Vistaril 25 mg twice daily  Increased Lexapro to 20 mg daily due to severe depression, appears to be improving  Neuropsych eval -antipsychotic agents: N/A 5. Neuropsych: This patientiscapable of making decisions on herown behalf. 6. Skin/Wound Care:Routine skin checks 7. Fluids/Electrolytes/Nutrition:Routine in and outs 8. Hypertension. Norvasc 10 mg daily, Toprol-XL 50 mg daily  Hydralazine 10 TID started on 1/26, increased on 1/27, increased again on 1/31 Vitals:   07/15/20 0452 07/15/20 0724  BP: (!) 155/89 (!) 164/80  Pulse: 64 (!) 56  Resp: 18   Temp: 97.6 F (36.4 C)   SpO2: 94%    Elevated on 2/2, monitor in ambulatory setting with further adjustments as necessary. 9. Diabetes mellitus with peripheral neuropathy. Hemoglobin A1c 5.2.   Lantus insulin 25 units twice daily, decreased to 23 BID on 1/23, decreased to 20 BID on 1/25, decreased to 16 twice daily on 1/28, decreased to 12 on 1/29  Plan to initiate Novolog to stabilize CBGs after accommodated to decreased insulin   CBG (last 3)  Recent Labs    07/14/20 1647 07/14/20 2121 07/15/20 0608  GLUCAP 187* 150* 100*   Slightly labile on 2/2  Monitor with increased mobility  10. COPD/asthmaPirfenidone801 mg 3 times daily, Ellipta inhaler 1 puff daily, Dulera 2 puffs twice daily -Pirfenidone makes her nauseas, sometimes have diarrhea---treat supportively as she wants to stay on medication 11.   Hyperlipidemia: Lipitor, fenofibrate,Vascepa2 mg twice daily 12. History of cocaine use remote tobacco abuse. Urine drug screen negative 13. GERD. Protonix  1/22- add Pepcid 20 mg daily per pharmacy dosing- since still having GERD Sx's.  14.?  AKI on CKD  Creatinine 1.91 on 2/1, continue to monitor in ambulatory setting  Encourage fluids 15, Pancytopenia  ?  Pirfenidone  Cell lines stabilizing (fluctuates at baseline), improving on 1/31, continue to monitor in ambulatory setting  HIT negative 16. Loose stools  1/23- due to [place] for pulm fibrosis-trial imodium prn if bad- having 3-4 loose stools/day  > 30 minutes spent in total in discharge planning between myself and PA regarding aforementioned, as well discussion regarding DME equipment, follow-up appointments, follow-up therapies, discharge medications, discharge recommendations, answering questions  LOS: 15 days A FACE TO FACE EVALUATION WAS PERFORMED  Kedarius Aloisi Lorie Phenix 07/15/2020, 11:12 AM

## 2020-07-15 NOTE — Progress Notes (Signed)
Patient d/c home with sister. A&O x4 at the time of d/c, denied pain or discomfort. All questions and concerns addressed. Eleven  tablets of home meds given back to her sister. We continue to monitor.

## 2020-07-15 NOTE — Plan of Care (Signed)
Goal met for d/c

## 2020-07-27 ENCOUNTER — Encounter: Payer: Medicare HMO | Admitting: Registered Nurse

## 2020-07-29 ENCOUNTER — Encounter: Payer: Self-pay | Admitting: Registered Nurse

## 2020-07-29 ENCOUNTER — Other Ambulatory Visit: Payer: Self-pay

## 2020-07-29 ENCOUNTER — Other Ambulatory Visit: Payer: Self-pay | Admitting: Internal Medicine

## 2020-07-29 ENCOUNTER — Encounter: Payer: Medicare HMO | Attending: Registered Nurse | Admitting: Registered Nurse

## 2020-07-29 VITALS — BP 157/88 | HR 74 | Temp 98.4°F | Ht 63.0 in | Wt 186.6 lb

## 2020-07-29 DIAGNOSIS — I10 Essential (primary) hypertension: Secondary | ICD-10-CM

## 2020-07-29 DIAGNOSIS — I639 Cerebral infarction, unspecified: Secondary | ICD-10-CM | POA: Insufficient documentation

## 2020-07-29 DIAGNOSIS — I6381 Other cerebral infarction due to occlusion or stenosis of small artery: Secondary | ICD-10-CM

## 2020-07-29 DIAGNOSIS — E1142 Type 2 diabetes mellitus with diabetic polyneuropathy: Secondary | ICD-10-CM

## 2020-07-29 DIAGNOSIS — E785 Hyperlipidemia, unspecified: Secondary | ICD-10-CM | POA: Diagnosis present

## 2020-07-29 NOTE — Progress Notes (Signed)
Subjective:    Patient ID: Erica [name], female    DOB: 07-10-1955, 65 y.o.   MRN: [id number]  HPI: Erica [name] is a 65 y.o. female who is here for hospital follow up visit of her Right Thalamic Infarction, Essential Hypertension, Dyslipidemia and Diabetic Peripheral neuropathy. She presented to [facility] on 06/25/2020 with left sided weakness and mechanical falls. Neurology consulted.   CT Head WO Contrast:  IMPRESSION: 1. Lacunar infarcts in the right caudate and right cerebellum, new from 2012, but age indeterminate. 2. No hemorrhage or evidence of large or territorial ischemia. 3. Chronic left sphenoid sinusitis. MR Brain WO Contrast: MR Angio IMPRESSION: 1. 1 cm acute infarction in the anterolateral thalamus on the right, possibly with some involvement of the posterior limb internal capsule. Acute infarction within the right caudate head. Scattered other foci of high signal on diffusion imaging within the hemispheric white matter, not showing restricted diffusion on the ADC map, and therefore probably representing T2 shine through from old white matter infarctions. 2. Extensive old small vessel infarctions throughout the brain as outlined above. Small old cortical infarctions in the right frontal lobe and both parieto-occipital regions. 3. No large or medium vessel occlusion or correctable proximal stenosis. Mild atherosclerotic irregularity of the PCA branches.  Erica [name] was maintained on Plavix and aspirin x 3 weeks then Plavix alone per discharge summary.   Erica [name] was admitted to inpatient rehabilitation on 06/30/2020 and discharged home on 07/15/2020. She is receiving home health therapy with [facility]. She states she has pain in her left lower extremity. She rates her pain 5. Also reports she has a good appetite.   Sister in room.   Pain Inventory Average Pain 5 Pain Right Now 5 My pain is constant, burning and tingling  LOCATION OF PAIN  Left  hand, left leg, left side of face BOWEL Number of stools per week: 7 Oral laxative use No  Type of laxative  Enema or suppository use No  History of colostomy No  Incontinent No   BLADDER Normal In and out cath, frequency n/a Able to self cath No  Bladder incontinence No  Frequent urination Yes  Leakage with coughing No  Difficulty starting stream No  Incomplete bladder emptying Yes    Mobility use a walker ability to climb steps?  yes do you drive?  no use a wheelchair transfers alone Do you have any goals in this area?  yes  Function disabled: date disabled 2014  Neuro/Psych numbness tingling trouble walking depression anxiety loss of taste or smell  Prior Studies Any changes since last visit?  no New Patient  Physicians involved in your care Any changes since last visit?  no New Patient   Family History  Problem Relation Age of Onset  . Hypertension Father   . Stroke Father   . Diabetes Father   . Heart disease Mother   . Hypertension Mother   . Hyperlipidemia Mother   . Hypertension Sister   . Hypertension Brother   . Hypertension Sister   . Hypertension Brother   . Cancer Other 47       colon cancer   . Cancer Maternal Aunt        cancer, unknown type   . Cancer Maternal Uncle        bone cancer   . Cancer Paternal Aunt        lung cancer  . Cancer Paternal Uncle  lung cancer  . Cancer Maternal Uncle        colon cancer and prostate cancer   . Cancer Maternal Uncle        prostate cancer    Social History   Socioeconomic History  . Marital status: Single    Spouse name: Not on file  . Number of children: 1  . Years of education: Not on file  . Highest education level: Not on file  Occupational History  . Not on file  Tobacco Use  . Smoking status: Former Smoker    Packs/day: 0.50    Years: 20.00    Pack years: 10.00    Types: Cigarettes    Quit date: 01/05/2018    Years since quitting: 2.5  . Smokeless tobacco:  Never Used  . Tobacco comment: Unknown   Vaping Use  . Vaping Use: Never used  Substance and Sexual Activity  . Alcohol use: Not Currently  . Drug use: Not Currently    Types: "Crack" cocaine    Comment: Unknown   . Sexual activity: Never    Birth control/protection: None  Other Topics Concern  . Not on file  Social History Narrative  . Not on file   Social Determinants of Health   Financial Resource Strain: Not on file  Food Insecurity: Not on file  Transportation Needs: Not on file  Physical Activity: Not on file  Stress: Not on file  Social Connections: Not on file   Past Surgical History:  Procedure Laterality Date  . ANAL RECTAL MANOMETRY N/A 07/13/2016   Procedure: ANO RECTAL MANOMETRY;  Surgeon: Leighton Ruff, MD;  Location: [facility];  Service: Endoscopy;  Laterality: N/A;  . CESAREAN SECTION    . EUS N/A 11/21/2012   Procedure: LOWER ENDOSCOPIC ULTRASOUND (EUS);  Surgeon: Arta Silence, MD;  Location: [facility];  Service: Endoscopy;  Laterality: N/A;  . FLEXIBLE SIGMOIDOSCOPY N/A 11/21/2012   Procedure: FLEXIBLE SIGMOIDOSCOPY;  Surgeon: Arta Silence, MD;  Location: [facility];  Service: Endoscopy;  Laterality: N/A;  . FLEXIBLE SIGMOIDOSCOPY N/A 01/28/2013   Procedure: FLEXIBLE SIGMOIDOSCOPY;  Surgeon: Leighton Ruff, MD;  Location: [facility];  Service: Endoscopy;  Laterality: N/A;  . LAPAROSCOPIC LOW ANTERIOR RESECTION N/A 01/29/2013   Procedure: LAPAROSCOPIC LOW ANTERIOR RESECTION, Rigid Proctoscopy;  Surgeon: Leighton Ruff, MD;  Location: [facility];  Service: General;  Laterality: N/A;  . LAPAROSCOPIC SIGMOID COLECTOMY N/A 11/14/2012   Procedure: DIAGNOSTIC LAPAROSCOPY AND SIGMOIDMOIDOSCOPY ;  Surgeon: Rolm Bookbinder, MD;  Location: [facility];  Service: General;  Laterality: N/A;  . RECTAL ULTRASOUND N/A 07/13/2016   Procedure: RECTAL ULTRASOUND;  Surgeon: Leighton Ruff, MD;  Location: [facility];  Service: Endoscopy;  Laterality: N/A;  . TONSILLECTOMY    .  TONSILLECTOMY AND ADENOIDECTOMY     Past Medical History:  Diagnosis Date  . Arthritis    especially in shoulders  . Asthma   . Depression   . Diabetes mellitus   . GERD (gastroesophageal reflux disease)   . Headache(784.0)    "mild"  . Hypertension   . Mental disorder    depression  . Neuropathy    feet   . Pain    arthritis pain - takes tramadol as needed  . Rectal polyp    very little bleeding with bowel movements- no pain  . Suicide attempt ([place])    There were no vitals taken for this visit.  Opioid Risk Score:   Fall Risk Score:  `1  Depression screen [facility] 2/9  No flowsheet data found. Review of Systems  Constitutional: Positive for unexpected weight change (wt gain).  Respiratory: Positive for cough, shortness of breath and wheezing.   Cardiovascular: Positive for leg swelling.  Musculoskeletal:       Left leg pain & weakness    Neurological: Positive for weakness and numbness.       Fingers stings in left hand  Left side of face stings  Psychiatric/Behavioral:       Depression  Anxiety  All other systems reviewed and are negative.      Objective:   Physical Exam Vitals and nursing note reviewed.  Constitutional:      Appearance: Normal appearance.  Cardiovascular:     Rate and Rhythm: Normal rate and regular rhythm.     Pulses: Normal pulses.     Heart sounds: Normal heart sounds.  Pulmonary:     Effort: Pulmonary effort is normal.     Breath sounds: Normal breath sounds.  Musculoskeletal:     Cervical back: Normal range of motion and neck supple.     Comments: Normal Muscle Bulk and Muscle Testing Reveals:  Upper Extremities: Full ROM and Muscle Strength 5/5  Lower Extremities: Right Lower Extremity: Full ROM and Muscle Strength 5/5 Left Lower Extremity: Decreased ROM and Muscle Strength 5/5 Arises from Table slowly using walker  Narrow Based  Gait   Skin:    General: Skin is warm and dry.  Neurological:     Mental Status: She is alert  and oriented to person, place, and time.  Psychiatric:        Mood and Affect: Mood normal.        Behavior: Behavior normal.           Assessment & Plan:  1. Right Thalamic Infarction: Continue Home Health Therapy with [facility]. She has a neurology Appointment . Continue current medication regimen. Continue to monitor.  2.Essential Hypertension: Continue current medication regimen. Continue to monitor. PCP Following.  3. Dyslipidemia: Continue current medication regimen. PCP Following. Continue to Monitor.  4. Diabetic Peripheral neuropathy. Continue current medication regimen. PCP Following. Continue to monitor.   F/U with Dr Posey Pronto in 4- 6 weeks

## 2020-07-30 ENCOUNTER — Encounter: Payer: Medicare HMO | Admitting: Registered Nurse

## 2020-07-30 LAB — BASIC METABOLIC PANEL WITH GFR
BUN/Creatinine Ratio: 12 (calc) (ref 6–22)
BUN: 18 mg/dL (ref 7–25)
CO2: 23 mmol/L (ref 20–32)
Calcium: 8.9 mg/dL (ref 8.6–10.4)
Chloride: 114 mmol/L — ABNORMAL HIGH (ref 98–110)
Creat: 1.53 mg/dL — ABNORMAL HIGH (ref 0.50–0.99)
GFR, Est African American: 41 mL/min/{1.73_m2} — ABNORMAL LOW (ref >=60)
GFR, Est Non African American: 35 mL/min/{1.73_m2} — ABNORMAL LOW (ref >=60)
Glucose, Bld: 49 mg/dL — ABNORMAL LOW (ref 65–99)
Potassium: 4.6 mmol/L (ref 3.5–5.3)
Sodium: 144 mmol/L (ref 135–146)

## 2020-07-30 LAB — CBC
HCT: 29.9 % — ABNORMAL LOW (ref 35.0–45.0)
Hemoglobin: 10.2 g/dL — ABNORMAL LOW (ref 11.7–15.5)
MCH: 32.1 pg (ref 27.0–33.0)
MCHC: 34.1 g/dL (ref 32.0–36.0)
MCV: 94 fL (ref 80.0–100.0)
MPV: 9.5 fL (ref 7.5–12.5)
Platelets: 141 10*3/uL (ref 140–400)
RBC: 3.18 10*6/uL — ABNORMAL LOW (ref 3.80–5.10)
RDW: 18.4 % — ABNORMAL HIGH (ref 11.0–15.0)
WBC: 6.5 10*3/uL (ref 3.8–10.8)

## 2020-08-03 ENCOUNTER — Ambulatory Visit (INDEPENDENT_AMBULATORY_CARE_PROVIDER_SITE_OTHER): Payer: Medicare HMO | Admitting: Family Medicine

## 2020-08-11 ENCOUNTER — Other Ambulatory Visit: Payer: Self-pay | Admitting: Cardiology

## 2020-08-11 DIAGNOSIS — E781 Pure hyperglyceridemia: Secondary | ICD-10-CM

## 2020-08-17 ENCOUNTER — Ambulatory Visit (INDEPENDENT_AMBULATORY_CARE_PROVIDER_SITE_OTHER): Payer: Medicare HMO | Admitting: Family Medicine

## 2020-08-19 ENCOUNTER — Encounter: Payer: Self-pay | Admitting: Adult Health

## 2020-08-19 ENCOUNTER — Ambulatory Visit (INDEPENDENT_AMBULATORY_CARE_PROVIDER_SITE_OTHER): Payer: Medicare HMO | Admitting: Adult Health

## 2020-08-19 VITALS — BP 127/78 | HR 68 | Ht 63.0 in | Wt 187.0 lb

## 2020-08-19 DIAGNOSIS — I69354 Hemiplegia and hemiparesis following cerebral infarction affecting left non-dominant side: Secondary | ICD-10-CM

## 2020-08-19 DIAGNOSIS — I639 Cerebral infarction, unspecified: Secondary | ICD-10-CM | POA: Diagnosis not present

## 2020-08-19 DIAGNOSIS — R29818 Other symptoms and signs involving the nervous system: Secondary | ICD-10-CM

## 2020-08-19 NOTE — Patient Instructions (Signed)
Continue working with therapies at home - may consider transitioning to outpatient therapies once completed  Ensure follow up visit is scheduled with your cardiologist - it is recommended to complete 30 day heart monitor to rule out atrial fibrillation in setting of recent stroke  Continue clopidogrel 75 mg daily  and atorvastatin  for secondary stroke prevention  Continue to follow up with PCP/cardiology regarding cholesterol, blood pressure and diabetes management  Maintain strict control of hypertension with blood pressure goal below 130/90, diabetes with hemoglobin A1c goal below 7% and cholesterol with LDL cholesterol (bad cholesterol) goal below 70 mg/dL.       Followup in the future with me in 3 months or call earlier if needed       Thank you for coming to see [place] at [facility]. I hope we have been able to provide you high quality care today.  You may receive a patient satisfaction survey over the next few weeks. We would appreciate your feedback and comments so that we may continue to improve ourselves and the health of our patients.    Stroke Prevention Some medical conditions and lifestyle choices can lead to a higher risk for a stroke. You can help to prevent a stroke by eating healthy foods and exercising. It also helps to not smoke and to manage any health problems you may have. How can this condition affect me? A stroke is an emergency. It should be treated right away. A stroke can lead to brain damage or threaten your life. There is a better chance of surviving and getting better after a stroke if you get medical help right away. What can increase my risk? The following medical conditions may increase your risk of a stroke:  Diseases of the heart and blood vessels (cardiovascular disease).  High blood pressure (hypertension).  Diabetes.  High cholesterol.  Sickle cell disease.  Problems with blood clotting.  Being very  overweight.  Sleeping problems (obstructivesleep apnea). Other risk factors include:  Being older than age 50.  A history of blood clots, stroke, or mini-stroke (TIA).  Race, ethnic background, or a family history of stroke.  Smoking or using tobacco products.  Taking birth control pills, especially if you smoke.  Heavy alcohol and drug use.  Not being active. What actions can I take to prevent this? Manage your health conditions  High cholesterol. ? Eat a healthy diet. If this is not enough to manage your cholesterol, you may need to take medicines. ? Take medicines as told by your doctor.  High blood pressure. ? Try to keep your blood pressure below 130/80. ? If your blood pressure cannot be managed through a healthy diet and regular exercise, you may need to take medicines. ? Take medicines as told by your doctor. ? Ask your doctor if you should check your blood pressure at home. ? Have your blood pressure checked every year.  Diabetes. ? Eat a healthy diet and get regular exercise. If your blood sugar (glucose) cannot be managed through diet and exercise, you may need to take medicines. ? Take medicines as told by your doctor.  Talk to your doctor about getting checked for sleeping problems. Signs of a problem can include: ? Snoring a lot. ? Feeling very tired.  Make sure that you manage any other conditions you have. Nutrition  Follow instructions from your doctor about what to eat or drink. You may be told to: ? Eat and drink fewer calories each day. ? Limit  how much salt (sodium) you use to 1,500 milligrams (mg) each day. ? Use only healthy fats for cooking, such as olive oil, canola oil, and sunflower oil. ? Eat healthy foods. To do this:  Choose foods that are high in fiber. These include whole grains, and fresh fruits and vegetables.  Eat at least 5 servings of fruits and vegetables a day. Try to fill one-half of your plate with fruits and vegetables at  each meal.  Choose low-fat (lean) proteins. These include low-fat cuts of meat, chicken without skin, fish, tofu, beans, and nuts.  Eat low-fat dairy products. ? Avoid foods that:  Are high in salt.  Have saturated fat.  Have trans fat.  Have cholesterol.  Are processed or pre-made. ? Count how many carbohydrates you eat and drink each day.   Lifestyle  If you drink alcohol: ? Limit how much you have to:  0-1 drink a day for women who are not pregnant.  0-2 drinks a day for men. ? Know how much alcohol is in your drink. In the [place] one drink equals one 12 oz bottle of beer (322m), one 5 oz glass of wine (1457m, or one 1 oz glass of hard liquor (4491m  Do not smoke or use any products that have nicotine or tobacco. If you need help quitting, ask your doctor.  Avoid secondhand smoke.  Do not use drugs. Activity  Try to stay at a healthy weight.  Get at least 30 minutes of exercise on most days, such as: ? Fast walking. ? Biking. ? Swimming.   Medicines  Take over-the-counter and prescription medicines only as told by your doctor.  Avoid taking birth control pills. Talk to your doctor about the risks of taking birth control pills if: ? You are over 35 85ars old. ? You smoke. ? You get very bad headaches. ? You have had a blood clot. Where to find more information  American Stroke Association: www.strokeassociation.org Get help right away if:  You or a loved one has any signs of a stroke. "BE FAST" is an easy way to remember the warning signs: ? B - Balance. Dizziness, sudden trouble walking, or loss of balance. ? E - Eyes. Trouble seeing or a change in how you see. ? F - Face. Sudden weakness or loss of feeling of the face. The face or eyelid may droop on one side. ? A - Arms. Weakness or loss of feeling in an arm. This happens all of a sudden and most often on one side of the body. ? S - Speech. Sudden trouble speaking, slurred speech, or trouble  understanding what people say. ? T - Time. Time to call emergency services. Write down what time symptoms started.  You or a loved one has other signs of a stroke, such as: ? A sudden, very bad headache with no known cause. ? Feeling like you may vomit (nausea). ? Vomiting. ? A seizure. These symptoms may be an emergency. Get help right away. Call your local emergency services (911 in the [place]  Do not wait to see if the symptoms will go away.  Do not drive yourself to the hospital. Summary  You can help to prevent a stroke by eating healthy, exercising, and not smoking. It also helps to manage any health problems you have.  Do not smoke or use any products that contain nicotine or tobacco.  Get help right away if you or a loved one has any signs of a stroke.  This information is not intended to replace advice given to you by your health care provider. Make sure you discuss any questions you have with your health care provider. Document Revised: 12/30/2019 Document Reviewed: 12/30/2019 Elsevier Patient Education  [place].

## 2020-08-19 NOTE — Progress Notes (Signed)
[facility] [address] [place]. [place] [address] [phone]       [place] Erica [name] Date of Birth:  [birth year] Medical Record Number:  [id number]   Reason for Referral:  hospital stroke follow up    SUBJECTIVE:   CHIEF COMPLAINT:  Chief Complaint  Patient presents with  . Follow-up    RM 42 With son Jeneen Rinks) Pt states she is having facial numbness on L side of face, L fingers are number and L leg weakness     HPI:   Erica [name] is a 65 y.o. female with history of hypertension, hypercholesteremia, diabetes complicated by neuropathy, CAD, depression C/V prior suicide attempt, arthritis, 10-pack-year history of smoking quit in 2020, COPD/asthma, polysubstance abuse who presented to [facility] ED on 06/25/2020 with slurred speech on and off for past week and left leg weakness.    Personally reviewed hospitalization pertinent progress notes, lab work and imaging with summary provided.  Evaluated by Dr. Erlinda Hong with stroke work-up revealing right caudate head and right anterior lateral thalamic infarcts, likely secondary to small vessel disease however given 2 separate locations unable to rule out embolic source with 26-JFH cardiac event monitor outpatient.  Recommended DAPT for 3 weeks and Plavix alone.  LDL 86 -increase atorvastatin from 20 mg to 40 mg daily and ongoing use of fenofibrate and Vascepa.  Controlled DM with A1c 5.2.  Other stroke risk factors include advanced age, obesity, CAD, hx of cocaine use and former tobacco use.  Residual deficits of left hemiparesis and left hemisensory impairment.  Evaluated by therapies and discharged to CIR for ongoing therapy needs  Stroke: Lacunar infarcts in right caudate head and right anterior lateral thalamus, likely due to small vessel disease. However, given two separate location will need to rule out emoblic source  CT head: Lacunar infarcts in the right caudate and right cerebellum, new from 2012,  but age indeterminate.  MRI : 1 cm acute infarction in the anterolateral thalamus on the right, possibly with some involvement of the posterior limb internal capsule. Acute infarction within the right caudate head.   MRA : Both internal carotid arteries widely patent through the skull base and siphon regions. No stenosis.   Carotid Doppler: Velocities in the left and right ICA are consistent with a 1-39%  stenosis.   2D Echo: Left ventricular ejection fraction, by estimation, is 55 to 60%.   30 day cardiac event monitoring as outpt to rule out afib  LDL 86  HgbA1c 5.2  RPR/HIV neg  VTE prophylaxis -Lovenox 30 mg  aspirin 81 mg daily prior to admission, now on aspirin 81 mg daily and Plavix DAPT for 3 weeks, then Plavix 75 mg alone.   Therapy recommendations: CIR  Disposition: d/c to CIR on 06/30/2020  Today, 08/19/2020, Erica [name] is being seen for hospital follow-up accompanied by her son.  She was discharged home from [place] on 07/15/2020.  Reports residual left sided weakness with numbness/tingling and left lower facial numbness Working with [facility] PT/OT 2x/ week -reports continued improvement since discharge Currently ambulating with rolling walker but she was not using previously -denies any recent falls Lives with her son who will assist as needed but she has been slowly becoming more independent with ADLs Denies new stroke/TIA symptoms  Completed 3 weeks DAPT and remains on Plavix alone -denies side effects Reports compliance on atorvastatin 40 mg daily -denies side effects Blood pressure today satisfactory 127/78 -monitors at home and  typically stable Glucose levels routinely monitored at home which have been fluctuating per patient report  She has not had follow-up with PCP or cardiology since discharge  No further concerns at this time     ROS:   14 system review of systems performed and negative with exception of those listed in HPI  PMH:  Past Medical History:   Diagnosis Date  . Arthritis    especially in shoulders  . Asthma   . Depression   . Diabetes mellitus   . GERD (gastroesophageal reflux disease)   . Headache(784.0)    "mild"  . Hypertension   . Mental disorder    depression  . Neuropathy    feet   . Pain    arthritis pain - takes tramadol as needed  . Rectal polyp    very little bleeding with bowel movements- no pain  . Suicide attempt ([place])     PSH:  Past Surgical History:  Procedure Laterality Date  . ANAL RECTAL MANOMETRY N/A 07/13/2016   Procedure: ANO RECTAL MANOMETRY;  Surgeon: Leighton Ruff, MD;  Location: [facility] ENDOSCOPY;  Service: Endoscopy;  Laterality: N/A;  . CESAREAN SECTION    . EUS N/A 11/21/2012   Procedure: LOWER ENDOSCOPIC ULTRASOUND (EUS);  Surgeon: Arta Silence, MD;  Location: [facility];  Service: Endoscopy;  Laterality: N/A;  . FLEXIBLE SIGMOIDOSCOPY N/A 11/21/2012   Procedure: FLEXIBLE SIGMOIDOSCOPY;  Surgeon: Arta Silence, MD;  Location: [facility] END[facility];  Service: Endoscopy;  Laterality: N/A;  . FLEXIBLE SIGMOIDOSCOPY N/A 01/28/2013   Procedure: FLEXIBLE SIGMOIDOSCOPY;  Surgeon: Leighton Ruff, MD;  Location: [facility] ENDOSCOPY;  Service: Endoscopy;  Laterality: N/A;  . LAPAROSCOPIC LOW ANTERIOR RESECTION N/A 01/29/2013   Procedure: LAPAROSCOPIC LOW ANTERIOR RESECTION, Rigid Proctoscopy;  Surgeon: Leighton Ruff, MD;  Location: [facility] ORS;  Service: General;  Laterality: N/A;  . LAPAROSCOPIC SIGMOID COLECTOMY N/A 11/14/2012   Procedure: DIAGNOSTIC LAPAROSCOPY AND SIGMOIDMOIDOSCOPY ;  Surgeon: Rolm Bookbinder, MD;  Location: [facility] ORS;  Service: General;  Laterality: N/A;  . RECTAL ULTRASOUND N/A 07/13/2016   Procedure: RECTAL ULTRASOUND;  Surgeon: Leighton Ruff, MD;  Location: [facility] ENDOSCOPY;  Service: Endoscopy;  Laterality: N/A;  . TONSILLECTOMY    . TONSILLECTOMY AND ADENOIDECTOMY      Social History:  Social History   Socioeconomic History  . Marital status: Single    Spouse name: Not on file  . Number of  children: 1  . Years of education: Not on file  . Highest education level: Not on file  Occupational History  . Not on file  Tobacco Use  . Smoking status: Former Smoker    Packs/day: 0.50    Years: 20.00    Pack years: 10.00    Types: Cigarettes    Quit date: 01/05/2018    Years since quitting: 2.6  . Smokeless tobacco: Never Used  . Tobacco comment: Unknown   Vaping Use  . Vaping Use: Never used  Substance and Sexual Activity  . Alcohol use: Not Currently  . Drug use: Not Currently    Types: "Crack" cocaine    Comment: Unknown   . Sexual activity: Never    Birth control/protection: None  Other Topics Concern  . Not on file  Social History Narrative  . Not on file   Social Determinants of Health   Financial Resource Strain: Not on file  Food Insecurity: Not on file  Transportation Needs: Not on file  Physical Activity: Not on file  Stress: Not on file  Social Connections: Not  on file  Intimate Partner Violence: Not on file    Family History:  Family History  Problem Relation Age of Onset  . Hypertension Father   . Stroke Father   . Diabetes Father   . Heart disease Mother   . Hypertension Mother   . Hyperlipidemia Mother   . Hypertension Sister   . Hypertension Brother   . Hypertension Sister   . Hypertension Brother   . Cancer Other 47       colon cancer   . Cancer Maternal Aunt        cancer, unknown type   . Cancer Maternal Uncle        bone cancer   . Cancer Paternal Aunt        lung cancer  . Cancer Paternal Uncle        lung cancer  . Cancer Maternal Uncle        colon cancer and prostate cancer   . Cancer Maternal Uncle        prostate cancer     Medications:   Current Outpatient Medications on File Prior to Visit  Medication Sig Dispense Refill  . acetaminophen (TYLENOL) 325 MG tablet Take 2 tablets (650 mg total) by mouth every 4 (four) hours as needed for mild pain (or temp > 37.5 C (99.5 F)).    Marland Kitchen albuterol (VENTOLIN HFA) 108 (90  Base) MCG/ACT inhaler Inhale 2 puffs into the lungs every 4 (four) hours as needed for wheezing. 1 g 5  . amLODipine (NORVASC) 10 MG tablet Take 1 tablet (10 mg total) by mouth daily. 30 tablet 2  . atorvastatin (LIPITOR) 40 MG tablet Take 1 tablet (40 mg total) by mouth daily. 30 tablet 0  . BD PEN NEEDLE NANO 2ND GEN 32G X 4 MM MISC See admin instructions.    . blood glucose meter kit and supplies Dispense based on patient and insurance preference. Use up to four times daily as directed. (FOR ICD-10 E10.9, E11.9). 1 each 0  . blood glucose meter kit and supplies Dispense based on patient and insurance preference. Use up to four times daily as directed. (FOR ICD-10 E10.9, E11.9). 1 each 0  . busPIRone (BUSPAR) 10 MG tablet Take 1 tablet (10 mg total) by mouth daily at 6 (six) AM. 30 tablet 0  . clopidogrel (PLAVIX) 75 MG tablet Take 1 tablet (75 mg total) by mouth daily. 30 tablet 1  . diclofenac Sodium (VOLTAREN) 1 % GEL Apply 2 g topically 4 (four) times daily. 2 g 0  . escitalopram (LEXAPRO) 20 MG tablet Take 1 tablet (20 mg total) by mouth daily. 30 tablet 0  . famotidine (PEPCID) 20 MG tablet Take 1 tablet (20 mg total) by mouth daily. 30 tablet 0  . fenofibrate (TRICOR) 145 MG tablet TAKE 1 TABLET(145 MG) BY MOUTH DAILY 90 tablet 1  . gabapentin (NEURONTIN) 300 MG capsule Take 1 capsule (300 mg total) by mouth 3 (three) times daily. 90 capsule 0  . hydrALAZINE (APRESOLINE) 25 MG tablet Take 1.5 tablets (37.5 mg total) by mouth every 8 (eight) hours. 90 tablet 0  . icosapent Ethyl (VASCEPA) 1 g capsule Take 2 capsules (2 g total) by mouth 2 (two) times daily. 360 capsule 0  . metoprolol succinate (TOPROL-XL) 50 MG 24 hr tablet TAKE 1 TABLET(50 MG) BY MOUTH DAILY WITH OR IMMEDIATELY FOLLOWING A MEAL 90 tablet 3  . Multiple Vitamin (MULTIVITAMIN ADULT PO) Take 1 tablet by mouth daily.    Marland Kitchen  nortriptyline (PAMELOR) 25 MG capsule Take 25 mg by mouth at bedtime as needed.    . Pirfenidone (ESBRIET)  267 MG TABS Take 3 tablets (801 mg total) by mouth in the morning, at noon, and at bedtime. 270 tablet 11  . SPIRIVA RESPIMAT 1.25 MCG/ACT AERS INHALE 2 PUFFS INTO THE LUNGS DAILY 4 g 5  . SYMBICORT 160-4.5 MCG/ACT inhaler Inhale 2 puffs into the lungs 2 (two) times daily. 1 Inhaler 5  . TOUJEO SOLOSTAR 300 UNIT/ML Solostar Pen Inject 12 Units into the skin 2 (two) times daily. 50 units  in th A.M. and  40 units subcutaneous in the P.M. 1.5 mL 1   No current facility-administered medications on file prior to visit.    Allergies:   Allergies  Allergen Reactions  . Penicillins Hives, Itching and Swelling    Tongue swells up Has patient had a PCN reaction causing immediate rash, facial/tongue/throat swelling, SOB or lightheadedness with hypotension: Yes Has patient had a PCN reaction causing severe rash involving mucus membranes or skin necrosis: Yes Has patient had a PCN reaction that required hospitalization Yes Has patient had a PCN reaction occurring within the last 10 years: No If all of the above answers are "NO", then may proceed with Cephalosporin use.       OBJECTIVE:  Physical Exam  Vitals:   08/19/20 0905  BP: 127/78  Pulse: 68  Weight: 187 lb (84.8 kg)  Height: _0  (1.6 m)   Body mass index is 33.13 kg/m. No exam data present  General: well developed, well nourished,  very pleasant elderly African-American female, seated, in no evident distress Head: head normocephalic and atraumatic.   Neck: supple with no carotid or supraclavicular bruits Cardiovascular: regular rate and rhythm, no murmurs Musculoskeletal: no deformity Skin:  no rash/petichiae Vascular:  Normal pulses all extremities   Neurologic Exam Mental Status: Awake and fully alert.   Fluent speech and language.  Oriented to place and time. Recent and remote memory intact. Attention span, concentration and fund of knowledge appropriate during visit. Mood and affect appropriate and cooperative with  history taking and exam.  Cranial Nerves: Fundoscopic exam reveals sharp disc margins. Pupils equal, briskly reactive to light. Extraocular movements full without nystagmus. Visual fields full to confrontation. Hearing intact.  Decreased light touch left lower facial sensation.  Mild left lower facial weakness.  Tongue and palate moves normally and symmetrically.  Motor: Normal bulk and tone and normal strength on the right side. Mild left hemiparesis 4/5 upper and lower extremity Sensory.:  Decreased light touch sensation left upper and lower extremity Coordination: Rapid alternating movements normal in all extremities except decreased left hand. Finger-to-nose and heel-to-shin performed accurately on right side.  Orbits right arm over left arm Gait and Station: Arises from chair without difficulty. Stance is normal. Gait demonstrates  decreased stride length and step height LLE with mild imbalance and use of rolling walker Reflexes: 1+ and symmetric. Toes downgoing.     NIHSS  2 Modified Rankin  3      ASSESSMENT: Erica [name] is a 65 y.o. year old female presented with 1 week history of intermittent slurred speech and left leg weakness on 06/25/2020 with stroke work-up revealing lacunar infarcts in the right caudate head and right anterior lateral thalamus, likely secondary to small vessel disease although embolic source could not be ruled out due to 2 separate locations. Vascular risk factors include HTN, HLD, DM, CAD, history of tobacco use and history of  polysubstance abuse.      PLAN:  1. Ischemic stroke:  a. Residual deficit: Mild left hemiparesis and hemisensory impairment.  Continue working with [facility] PT/OT and possibly transition to outpatient therapies once completed if indicated b. Continue clopidogrel 75 mg daily  and atorvastatin for secondary stroke prevention.   c. Advised to follow-up with her established cardiologist to complete 30-day cardiac event monitor as advised at  hospital discharge  d. discussed secondary stroke prevention measures and importance of close PCP follow up for aggressive stroke risk factor management  2. HTN: BP goal <130/90.  Stable on current regimen per PCP/cardiology 3. HLD: LDL goal <70. Recent LDL 86 - on atorvastatin 40 mg daily, fenofibrate and Vascepa per PCP.  Advised to schedule follow-up visit with PCP for repeat lipid panel next 1 to 2 months and ongoing prescribing of statin 4. DMII: A1c goal<7.0. Recent A1c 5.2 on Toujeo monitored by PCP    Follow up in 3 months or call earlier if needed   CC:  [place] provider: Dr. Leonie Man PCP: Nolene Ebbs, MD    I spent 45 minutes of face-to-face and non-face-to-face time with patient and son.  This included previsit chart review including recent hospitalization pertinent progress notes, lab work and imaging, lab review, study review, order entry, electronic health record documentation, patient education regarding recent stroke including etiology, residual deficits, importance of managing stroke risk factors and answered all other questions to patient and sons satisfaction   Frann Rider, AGNP-BC  [facility] [address] [place], [place] [address]  Phone [phone] Fax [phone] Note: This document was prepared with digital dictation and possible smart phrase technology. Any transcriptional errors that result from this process are unintentional.

## 2020-08-26 ENCOUNTER — Ambulatory Visit: Payer: Medicare HMO | Admitting: Podiatry

## 2020-08-27 ENCOUNTER — Telehealth: Payer: Self-pay | Admitting: Podiatry

## 2020-08-27 NOTE — Telephone Encounter (Signed)
Patient wanted Dr. Paulla Dolly to know that due to a recent stroke, that she is on blood thinners. Wanted to inform Regal in case he decided to do in office procedure.

## 2020-08-28 ENCOUNTER — Other Ambulatory Visit: Payer: Self-pay

## 2020-08-28 ENCOUNTER — Ambulatory Visit (INDEPENDENT_AMBULATORY_CARE_PROVIDER_SITE_OTHER): Payer: Medicare HMO | Admitting: Podiatry

## 2020-08-28 DIAGNOSIS — D169 Benign neoplasm of bone and articular cartilage, unspecified: Secondary | ICD-10-CM | POA: Diagnosis not present

## 2020-08-28 DIAGNOSIS — L03031 Cellulitis of right toe: Secondary | ICD-10-CM

## 2020-08-28 DIAGNOSIS — L600 Ingrowing nail: Secondary | ICD-10-CM | POA: Diagnosis not present

## 2020-08-30 NOTE — Progress Notes (Signed)
Subjective:   Patient ID: Erica [name], female   DOB: 65 y.o.   MRN: [id number]   HPI Patient presents stating she has had poor health and she has had trouble with her feet in general and her nails along with ingrown toenail condition that she tries to take of herself and has spur formation also.   ROS      Objective:  Physical Exam  Neurovascular status unchanged from previous visit with nail disease with spur formation of the fifth digit bilateral with keratotic tissue formation painful when pressed     Assessment:  Numerous different problems with nail disease lesion formation rotated digit deformity     Plan:  H&P education rendered debridement techniques discussed and done today and we will see her back and may ultimately require more aggressive treatment plan for this condition

## 2020-08-30 NOTE — Progress Notes (Signed)
I agree with the above plan

## 2020-09-03 ENCOUNTER — Other Ambulatory Visit (INDEPENDENT_AMBULATORY_CARE_PROVIDER_SITE_OTHER): Payer: Medicare HMO

## 2020-09-03 ENCOUNTER — Ambulatory Visit (INDEPENDENT_AMBULATORY_CARE_PROVIDER_SITE_OTHER): Payer: Medicare HMO | Admitting: Internal Medicine

## 2020-09-03 ENCOUNTER — Encounter: Payer: Medicare HMO | Attending: Registered Nurse | Admitting: Physical Medicine & Rehabilitation

## 2020-09-03 ENCOUNTER — Encounter: Payer: Self-pay | Admitting: Internal Medicine

## 2020-09-03 ENCOUNTER — Other Ambulatory Visit: Payer: Self-pay

## 2020-09-03 VITALS — BP 122/88 | HR 68 | Temp 97.5°F | Ht 63.0 in | Wt 187.0 lb

## 2020-09-03 DIAGNOSIS — J84112 Idiopathic pulmonary fibrosis: Secondary | ICD-10-CM

## 2020-09-03 DIAGNOSIS — K521 Toxic gastroenteritis and colitis: Secondary | ICD-10-CM | POA: Diagnosis not present

## 2020-09-03 DIAGNOSIS — Z5181 Encounter for therapeutic drug level monitoring: Secondary | ICD-10-CM

## 2020-09-03 DIAGNOSIS — I639 Cerebral infarction, unspecified: Secondary | ICD-10-CM | POA: Insufficient documentation

## 2020-09-03 DIAGNOSIS — J439 Emphysema, unspecified: Secondary | ICD-10-CM

## 2020-09-03 DIAGNOSIS — E1142 Type 2 diabetes mellitus with diabetic polyneuropathy: Secondary | ICD-10-CM | POA: Insufficient documentation

## 2020-09-03 DIAGNOSIS — I10 Essential (primary) hypertension: Secondary | ICD-10-CM | POA: Insufficient documentation

## 2020-09-03 DIAGNOSIS — E785 Hyperlipidemia, unspecified: Secondary | ICD-10-CM | POA: Insufficient documentation

## 2020-09-03 LAB — HEPATIC FUNCTION PANEL
ALT: 16 U/L (ref 0–35)
AST: 26 U/L (ref 0–37)
Albumin: 3.9 g/dL (ref 3.5–5.2)
Alkaline Phosphatase: 51 U/L (ref 39–117)
Bilirubin, Direct: 0.2 mg/dL (ref 0.0–0.3)
Total Bilirubin: 0.9 mg/dL (ref 0.2–1.2)
Total Protein: 7.6 g/dL (ref 6.0–8.3)

## 2020-09-03 MED ORDER — SPIRIVA RESPIMAT 1.25 MCG/ACT IN AERS: 2.0000 | INHALATION_SPRAY | Freq: Every day | RESPIRATORY_TRACT | 5 refills | Status: DC

## 2020-09-03 MED ORDER — SYMBICORT 160-4.5 MCG/ACT IN AERO: 2.0000 | INHALATION_SPRAY | Freq: Two times a day (BID) | RESPIRATORY_TRACT | 5 refills | Status: DC

## 2020-09-03 NOTE — Progress Notes (Signed)
This is the case of Erica [name], 65 y.o. Female, who was referred by Dr. Lorella Nimrod in consultation regarding abnormal chest ct scan.   As you very well know, patient smoked 1 pack per week since her 56s, curently down to a cigarette a day, was diagnosed with asthma/copd several yrs ago. Uses alb MDI prn, usually 1-2 x/week. She gets more than SOB with more than ADLs. She is not on o2.   She had rectal CA in 2014 for which she had surgery. Still in remission.  She had a chest ct scan in 01/2015 as part of CA surveillance.  That showed several pulm nodules (subcm) which have been stable, and some smaller.  Also with increased markings at the bases.   She had sinus issues in December.  Wen tot ER as she was not getting better.  CXR showed inc interstitial markings, hence the consult today. She is almost back to her baseline.   She has GERD, controlled with meds. She coughs daily, coughs in sleep. Cough worse at night.  On and off wheezing at HS.   Her sinus issues are better. Has PND.    Has snoring, gaspin, choking, unrefreshed sleep.  Wakes up tired and fatigued.  ESS 10. Hypersomnia affects her fxnality.    ROV  08/12/16 Patient returns to the office as follow-up after her tests. Since last seen, she has quit smoking. She had PFT  which showed mild COPD with an FEV1 of 1. 72 or 84% of predicted. Diffusion capacity was 61%. Her sleep study was (-) for OSA. She is able to do her ADLs without much difficulty. Gets winded with more than ADLs. Her reflux is controlled with medicines and diet changes. Her cough is also stable.     09/06/2017 Follow up : COPD and ILD  Pt returns for 1 year follow up . She was last seen in March 2018 . Found to have mild COPD and ILD changes on CT chest that are mild worse than 2015 CT scan .  She had autoimmune labs that essentially neg. She did not keep follow up .  Recommended on smoking cessation.  Says that she has baseline dyspnea with activity  none at rest , and dry cough .  Over last year feels dyspnea with activity has gotten worse. She gets winded easily .  No increased cough .  Still smoking, cessation discussed.   OV 10/24/2017  Chief Complaint  Patient presents with  . Follow-up    PFT was attempted today but pt unable to complete due to some sinus problems. Pt has some yellow to green mucus postnasally and also is coughing up a lot of mucus as well. Pt also states she has increased SOB due to symptoms.    Haeleigh Streiff presents to the interstitial lung disease clinic for evaluation of potential interstitial lung disease.  She is followed in the general pulmonary clinic as stated above.  Her other issues ongoing smoking.  She is somewhat of a poor historian and as soon as I walked in she reported a new complaint of acute sinus congestion for the last 2 weeks after having picked up a cold from her sister.  Also the ongoing spring pollen season is making things worse.  She is coughing up some green sputum.  She feels this needs to be treated by me and she wants an antibiotic for this.  There is no associated wheezing or cough or shortness of breath  because of this.  We discussed her underlying lung disease and she tells me that she is aware that she has COPD/emphysema.  But she is totally unaware about pulmonary fibrosis or interstitial lung disease.  She tells me that overall compared to last year she is stable and she only has mild dyspnea on exertion without much of a cough.  There is no chest pain no orthopnea proximal nocturnal dyspnea wheezing or hemoptysis or weight loss.  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  Only takes albuerol as needed.  Her last CT scan of the chest high resolution was in January 2018 that thoracic radiology Dr. Lorin Picket described as indicative of UIP.  I personally visualized the CT and I personally think this is indeterminate for UIP.  There is bilateral  bibasal craniocaudal gradients reticular abnormalities but I am not so sure that these are subpleural and more of I do not see clear-cut traction bronchiectasis or honeycombing although I will agree that there is no groundglass.   She has ongoing smoking    Results for POLLIE, POMA (MRN [id number]) as of 10/24/2017 16:28  Ref. Range 08/12/2016 14:23 09/07/2016 11:20  Anit Nuclear Antibody(ANA) Latest Ref Range: NEGATIVE  NEG   ANCA Proteinase 3 Latest Ref Range: 0.0 - 3.5 U/mL  <3.5  Anti JO-1 Latest Ref Range: 0.0 - 0.9 AI <3.1 <5.9  Cyclic Citrullin Peptide Ab Latest Units: Units <16   ds DNA Ab Latest Units: IU/mL <1   Myeloperoxidase Ab Latest Ref Range: 0.0 - 9.0 U/mL  <9.0  Cytoplasmic (C-ANCA) Latest Ref Range: <1:20  1:40 (H)   Scleroderma (Scl-70) (ENA) Antibody, IgG Latest Ref Range: <1.0 NEG AI  <1.0 NEG        OV 04/08/2019  Subjective:  Patient ID: Erica [name], female , DOB: Jul 23, 1955 , age 83 y.o. , MRN: [id number] , ADDRESS: [place] [address]   04/08/2019 -   Chief Complaint  Patient presents with  . Follow-up    Patient reports that she still has sob with exertion and some cough. She states that some days are better than others.    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 -returns for follow-up.  Last seen by myself in May 2019.  At that point in time I ordered a high-resolution CT chest.  She had it but has not properly followed up.  She in between had a telephone visit with one of the nurse practitioners and was given COPD medication refill.  After that she had a visit with another nurse practitioner.  She tells me that in the last year she is quit smoking.  She is also quit working because of dyspnea on exertion relieved by rest.  She tells me that since last year her dyspnea is progressive.  She is able to climb 1 flight of stairs and then has to  stop because of extreme shortness of breath.  She is continuing with her inhalers.  She does not have nighttime or daytime oxygen.  Review of the CT scan and visualization of the CT scan from 1 year ago shows UIP features.  There are no other new issues.     CLINICAL DATA:  Interstitial lung disease.  EXAM: CT CHEST WITHOUT CONTRAST - 04/09/2018  TECHNIQUE: Multidetector CT imaging of the chest was performed following the standard  protocol without intravenous contrast. High resolution imaging of the lungs, as well as inspiratory and expiratory imaging, was performed.  COMPARISON:  07/06/2016, PET 05/14/2015 and CT chest 01/12/2015.  FINDINGS: Cardiovascular: Coronary artery calcification. Heart is at the upper limits of normal in size to mildly enlarged. No pericardial effusion.  Mediastinum/Nodes: Mediastinal lymph nodes measure up to 1.2 cm in the low right paratracheal station, as before. Hilar regions are difficult to evaluate without IV contrast. No axillary adenopathy. Mild esophageal dilatation. Esophagus is otherwise unremarkable.  Lungs/Pleura: Worsening peripheral and basilar predominant subpleural reticulation, ground-glass, traction bronchiectasis/bronchiolectasis and probable honeycombing. No air trapping. Perifissural nodules measure up to 8 x 13 mm along the left major fissure, minimally increased from 07/06/2016 with still felt to represent subpleural lymph nodes. No pleural fluid. Airway is unremarkable.  Upper Abdomen: Visualized portion of the liver is unremarkable. Stones layer in the gallbladder. Visualized portions of the adrenal glands, kidneys, spleen, pancreas, stomach and bowel are grossly unremarkable. There may be a small hiatal hernia. Upper abdominal lymph nodes are not enlarged by CT size criteria.  Musculoskeletal: Degenerative changes in the spine.  IMPRESSION: 1. Progressive pulmonary parenchymal pattern of fibrosis,  as described above. Findings are consistent with UIP per consensus guidelines: Diagnosis of Idiopathic Pulmonary Fibrosis: An Official ATS/ERS/JRS/ALAT Clinical Practice Guideline. [place], Iss 5, [phone], Feb 11 2017. 2. Cholelithiasis. 3. Coronary artery calcification.   ROS  OV 06/18/2019  Subjective:  Patient ID: Erica [name], female , DOB: June 01, 1956 , age 58 y.o. 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 Follow-up    cough, mucus is tan     HPI KATRINIA STRAKER 65 y.o. -follow-up for her chest CT and PFT results.  In the interim denies any complaints.  Her symptom score is listed below.  She does not have any chest pain with exertion.  In talking to she only reports the symptoms.  I had to press her multiple different ways to really know if she understands what is going on in terms of etiologies.  After much conversation she currently at her COPD.  She had no idea about pulmonary fibrosis or interstitial lung disease.  I used several different colloquial terms.  This has been explained to her in the past.  But she understands she is got progressive shortness of breath and this is due to her lung disease.  So we went over the concept of pulmonary fibrosis again.  Her CT shows progressive UIP pattern of pulmonary fibrosis.  His previous serologies are negative.  She has coronary artery calcification and emphysema.  Her symptom burden is listed below.  Her walking desaturation test shows progression.  She is not had a stress test in a long time.  She does not have any chest pains  She says she had overnight oxygen study but we do not have those results with [place].          COMPARISON:  High-resolution chest CT 10/2  01/2018.   FINDINGS: Cardiovascular: Heart size is normal. There is no significant pericardial fluid, thickening or pericardial calcification. There is aortic atherosclerosis, as well as atherosclerosis of the great vessels of the mediastinum and the coronary arteries, including calcified atherosclerotic plaque in the  left anterior descending coronary arteries.  Mediastinum/Nodes: No pathologically enlarged mediastinal or hilar lymph nodes. Please note that accurate exclusion of hilar adenopathy is limited on noncontrast CT scans. Esophagus is unremarkable in appearance. No axillary lymphadenopathy.  Lungs/Pleura: High-resolution images demonstrate widespread patchy areas of ground-glass attenuation, septal thickening, thickening of the peribronchovascular interstitium, cylindrical bronchiectasis, peripheral bronchiolectasis and areas of honeycombing. These findings have a definitive craniocaudal gradient and arm minimally progressive compared to the prior study. Inspiratory and expiratory imaging is unremarkable. No acute consolidative airspace disease. No pleural effusions. No suspicious appearing pulmonary nodules or masses are noted.  Upper Abdomen: Partially calcified gallstone in the neck of the gallbladder.  Musculoskeletal: There are no aggressive appearing lytic or blastic lesions noted in the visualized portions of the skeleton.  IMPRESSION: 1. The appearance of the lungs remains compatible with interstitial lung disease, with a spectrum of findings considered diagnostic of usual interstitial pneumonia (UIP) per current ATS guidelines. Minimal progression compared to the prior study. 2. Aortic atherosclerosis, in addition to left anterior descending coronary artery disease. Please note that although the presence of coronary artery calcium documents the presence of coronary artery disease, the severity of this disease and any potential stenosis cannot be assessed  on this non-gated CT examination. Assessment for potential risk factor modification, dietary therapy or pharmacologic therapy may be warranted, if clinically indicated. 3. Cholelithiasis.  Aortic Atherosclerosis (ICD10-I70.0).   Electronically Signed   By: Vinnie Langton M.D.   On: 04/12/2019 16:30  xxxxxxxxxxxxxxxxxxxxxxxxxxxxxxxxxxxxxxxxxxxxxxxxxxxxxxxxxxxx     OV 08/15/2019  Subjective:  Patient ID: Erica Baldonado, female , DOB: [birth year] , age 69 y.o. , MRN: [id number] , ADDRESS: [address] [place] [address]   08/15/2019 -   Chief Complaint  Patient presents with  . Follow-up    Pt states she is some better since last visit. Pt still becomes SOB with exertion and will have occ SOB if not doing anything. Pt also has an occ cough but denies any CP.   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  IPF diagnosis given June 18, 2019.  Commenced on pirfenidone January 2021.  Ex-smoker  GERD  Last CT scan of the chest October 2020.  HPI ORLANDO THALMANN 65 y.o. -presents for follow-up with her sister.  She feels she is slowly getting worse.  Last visit early January 2021 started on pirfenidone given the diagnosis of IPF.  Overnight pulse ox study was normal in December 2020 so she is not on oxygen.  She tells me that she feels she is getting slowly worse.  A few months ago she could climb the 20 steps to her apartment stopping only once.  Currently she is stopping 2 times.  Her sister is extremely worried about the apartment situation because it is on the first or second floor.  And there is one flight of stairs to climb.  Also after she started the pirfenidone she had tolerance issues with GI symptoms.  And she was reduced to 2 tablets 3 times daily.  She is now back up at 3 pills 3 times daily and is tolerating this just fine.  Symptom scores are listed below walking desaturation test suggest that she is indeed getting worse.  She is seeing Dr.  Einar Gip for coronary artery calcification.  Notes reviewed.    SYMPTOM SCALE - ILD 06/18/2019 188# 08/15/2019 195#  O2 use ra ra  Shortness of Breath 0 -> 5 scale with 5 being worst (  score 6 If unable to do)   At rest 1 3  Simple tasks - showers, clothes change, eating, shaving 2 3  Household (dishes, doing bed, laundry) 2 4  Shopping 2 4  Walking level at own pace 2 4  Walking up Stairs 3 2  Total (40 - 48) Dyspnea Score 13 20  How bad is your cough? 4 x  How bad is your fatigue 4 4  nausea  3  vomit  3  diarrhea  3  anxiety  3  depression  2    2    Simple office walk 185 feet x  3 laps goal with forehead probe 10/24/2017  04/08/2019  06/18/2019 188# 08/15/2019   O2 used Room air Room air Room air Room air  Number laps completed _0 Comments about pace Normal pace   avgg  Resting Pulse Ox/HR 97% and 77/min 100% 95% and 91/min 96% and 101/min  Final Pulse Ox/HR 97% and 103/min 97% and 105/min 90% and 106/min 90% and 122/min  Desaturated </= 88% no no no no  Desaturated <= 3% points no Yes, 3 points Yes, 3 poitns Yes, 6 points  Got Tachycardic >/= 90/min yes yes yes yes  Symptoms at end of test No comment x No dyspnea Moderate duspnea  Miscellaneous comments No comment from CMA x x ? worse     Results for CARALYNN, GELBER (MRN [id number]) as of 06/18/2019 12:12  Ref. Range 08/11/2016 10:13 10/24/2017 14:58 06/13/2019 08:56  FVC-Pre Latest Units: L 2.56 2.79 2.52  FVC-%Pred-Pre Latest Units: % 98 108 98    Results for MCKENSEY, BERGHUIS A (MRN [id number]) as of 06/18/2019 12:12  Ref. Range 08/11/2016 10:13 10/24/2017 14:58 06/13/2019 08:56  DLCO unc Latest Units: ml/min/mmHg 14.81  7.70  DLCO unc % pred Latest Units: % 61  38    ROS - per HPI    OV 09/03/2020  Subjective:  Patient ID: Erica [name], female , DOB: Jul 16, 1955 , age 18 y.o. , MRN: [id number] , ADDRESS: [address] [place] [address] PCP Nolene Ebbs, MD Patient Care Team: Nolene Ebbs, MD as PCP -  General (Internal Medicine) Truitt Merle, MD as Consulting Physician (Hematology) Leighton Ruff, MD as Consulting Physician (General Surgery) Arta Silence, MD as Consulting Physician (Gastroenterology)  This Provider for this visit: Treatment Team:  Attending Provider: Brand Males, MD    09/03/2020 -   Chief Complaint  Patient presents with  . Follow-up    Pt states shortness of breath with activity, reports productive cough with improvement, goldish phlegm.     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  IPF diagnosis given June 18, 2019.  Commenced on pirfenidone January 2021.  Ex-smoker  GERD  Last CT scan of the chest October 2020.  HPI JAYEL SCADUTO 65 y.o. -follow-up IPF with associated emphysema.  She is on pirfenidone 3 pills 3 times daily.  She is having some diarrhea and GI side effects.  Her companion was with her asked if they could reduce the dose.  Did indicate that reducing the dose can help reduce side effects but also with lowered benefit profile.  Therefore they decided to stay on the full dose.  From a respiratory standpoint she is stable as documented by the symptom score below but she is lost weight.  She had a stroke in January 2022.  She is using a walker.  She feels numb on the face but on  gross testing here not much of focal neurologic deficit other than mild pronator drift in the left upper extremity.  She is asking for refills on her inhalers.  We did discuss briefly the concept of clinical trials as a care option.  She indicated she will reflect upon this.      SYMPTOM SCALE - ILD 06/18/2019 188# 08/15/2019 195# 09/03/2020 187#  O2 use ra ra ra  Shortness of Breath 0 -> 5 scale with 5 being worst (score 6 If unable to do)    At rest _0 Simple tasks - showers, clothes change, eating, shaving _1 Household (dishes, doing bed, laundry) _2 Shopping _3 Walking level at own pace _4 Walking up Stairs _5 Total  (40 - 48) Dyspnea Score _6 How bad is your cough? 4 x 3  How bad is your fatigue _7 nausea  3 3  vomit  3 3  diarrhea  3 3  anxiety  3 3  depression  _8 Simple office walk 185 feet x  3 laps goal with forehead probe 10/24/2017  04/08/2019  06/18/2019 188# 08/15/2019  09/03/2020   O2 used Room air Room air Room air Room air ra  Number laps completed _9 of 3 duie to stroke in [place] and walker used  Comments about pace Normal pace   avgg   Resting Pulse Ox/HR 97% and 77/min 100% 95% and 91/min 96% and 101/min 100% and 63  Final Pulse Ox/HR 97% and 103/min 97% and 105/min 90% and 106/min 90% and 122/min 96% and 85  Desaturated </= 88% _10   Desaturated <= 3% points no Yes, 3 points Yes, 3 poitns Yes, 6 points yes  Got Tachycardic >/= 90/min yes yes yes yes no  Symptoms at end of test No comment x No dyspnea Moderate duspnea Mild dyspena  Miscellaneous comments No comment from CMA x x ? worse        PFT  PFT Results Latest Ref Rng & Units 06/03/2020 06/13/2019 10/24/2017 08/11/2016  FVC-Pre L 2.59 2.52 2.79 2.56  FVC-Predicted Pre % 102 98 108 98  FVC-Post L - 2.63 - 2.99  FVC-Predicted Post % - 102 - 114  Pre FEV1/FVC % % 82 81 69 67  Post FEV1/FCV % % - 81 - 67  FEV1-Pre L 2.12 2.05 1.93 1.72  FEV1-Predicted Pre % 107 102 95 84  FEV1-Post L - 2.13 - 2.01  DLCO uncorrected ml/min/mmHg 8.38 7.70 - 14.81  DLCO UNC% % 42 38 - 61  DLCO corrected ml/min/mmHg 8.38 - - 14.73  DLCO COR %Predicted % 42 - - 60  DLVA Predicted % 53 47 - 66  TLC L - 4.48 - 5.31  TLC % Predicted % - 88 - 105  RV % Predicted % - 75 - 136       has a past medical history of Arthritis, Asthma, Depression, Diabetes mellitus, GERD (gastroesophageal reflux disease), Headache(784.0), Hypertension, Mental disorder, Neuropathy, Pain, Rectal polyp, and Suicide attempt ([place]).   reports that she quit smoking about 2 years ago. Her smoking use included cigarettes. She has a  10.00 pack-year smoking history. She has never used smokeless tobacco.  Past Surgical History:  Procedure Laterality Date  . ANAL RECTAL MANOMETRY N/A 07/13/2016   Procedure: Thalia Party  RECTAL MANOMETRY;  Surgeon: Leighton Ruff, MD;  Location: [facility];  Service: Endoscopy;  Laterality: N/A;  . CESAREAN SECTION    . EUS N/A 11/21/2012   Procedure: LOWER ENDOSCOPIC ULTRASOUND (EUS);  Surgeon: Arta Silence, MD;  Location: [facility];  Service: Endoscopy;  Laterality: N/A;  . FLEXIBLE SIGMOIDOSCOPY N/A 11/21/2012   Procedure: FLEXIBLE SIGMOIDOSCOPY;  Surgeon: Arta Silence, MD;  Location: [facility];  Service: Endoscopy;  Laterality: N/A;  . FLEXIBLE SIGMOIDOSCOPY N/A 01/28/2013   Procedure: FLEXIBLE SIGMOIDOSCOPY;  Surgeon: Leighton Ruff, MD;  Location: [facility];  Service: Endoscopy;  Laterality: N/A;  . LAPAROSCOPIC LOW ANTERIOR RESECTION N/A 01/29/2013   Procedure: LAPAROSCOPIC LOW ANTERIOR RESECTION, Rigid Proctoscopy;  Surgeon: Leighton Ruff, MD;  Location: [facility];  Service: General;  Laterality: N/A;  . LAPAROSCOPIC SIGMOID COLECTOMY N/A 11/14/2012   Procedure: DIAGNOSTIC LAPAROSCOPY AND SIGMOIDMOIDOSCOPY ;  Surgeon: Rolm Bookbinder, MD;  Location: [facility];  Service: General;  Laterality: N/A;  . RECTAL ULTRASOUND N/A 07/13/2016   Procedure: RECTAL ULTRASOUND;  Surgeon: Leighton Ruff, MD;  Location: [facility];  Service: Endoscopy;  Laterality: N/A;  . TONSILLECTOMY    . TONSILLECTOMY AND ADENOIDECTOMY      Allergies  Allergen Reactions  . Penicillins Hives, Itching and Swelling    Tongue swells up Has patient had a PCN reaction causing immediate rash, facial/tongue/throat swelling, SOB or lightheadedness with hypotension: Yes Has patient had a PCN reaction causing severe rash involving mucus membranes or skin necrosis: Yes Has patient had a PCN reaction that required hospitalization Yes Has patient had a PCN reaction occurring within the last 10 years: No If all of the above  answers are "NO", then may proceed with Cephalosporin use.     Immunization History  Administered Date(s) Administered  . Influenza Split 03/13/2017  . Influenza Whole 04/26/2018  . Influenza, High Dose Seasonal PF 02/29/2020  . Influenza,inj,Quad PF,6+ Mos 03/09/2019  . Influenza-Unspecified 03/13/2016  . PFIZER(Purple Top)SARS-COV-2 Vaccination 08/19/2019, 09/18/2019    Family History  Problem Relation Age of Onset  . Hypertension Father   . Stroke Father   . Diabetes Father   . Heart disease Mother   . Hypertension Mother   . Hyperlipidemia Mother   . Hypertension Sister   . Hypertension Brother   . Hypertension Sister   . Hypertension Brother   . Cancer Other 47       colon cancer   . Cancer Maternal Aunt        cancer, unknown type   . Cancer Maternal Uncle        bone cancer   . Cancer Paternal Aunt        lung cancer  . Cancer Paternal Uncle        lung cancer  . Cancer Maternal Uncle        colon cancer and prostate cancer   . Cancer Maternal Uncle        prostate cancer      Current Outpatient Medications:  .  acetaminophen (TYLENOL) 325 MG tablet, Take 2 tablets (650 mg total) by mouth every 4 (four) hours as needed for mild pain (or temp > 37.5 C (99.5 F))., Disp: , Rfl:  .  albuterol (VENTOLIN HFA) 108 (90 Base) MCG/ACT inhaler, Inhale 2 puffs into the lungs every 4 (four) hours as needed for wheezing., Disp: 1 g, Rfl: 5 .  atorvastatin (LIPITOR) 40 MG tablet, Take 1 tablet (40 mg total) by mouth daily., Disp: 30 tablet, Rfl: 0 .  BD PEN NEEDLE NANO 2ND GEN 32G X 4 MM MISC, See admin instructions., Disp: , Rfl:  .  blood glucose meter kit and supplies, Dispense based on patient and insurance preference. Use up to four times daily as directed. (FOR ICD-10 E10.9, E11.9)., Disp: 1 each, Rfl: 0 .  blood glucose meter kit and supplies, Dispense based on patient and insurance preference. Use up to four times daily as directed. (FOR ICD-10 E10.9, E11.9)., Disp: 1  each, Rfl: 0 .  busPIRone (BUSPAR) 10 MG tablet, Take 1 tablet (10 mg total) by mouth daily at 6 (six) AM., Disp: 30 tablet, Rfl: 0 .  clopidogrel (PLAVIX) 75 MG tablet, Take 1 tablet (75 mg total) by mouth daily., Disp: 30 tablet, Rfl: 1 .  diclofenac Sodium (VOLTAREN) 1 % GEL, Apply 2 g topically 4 (four) times daily., Disp: 2 g, Rfl: 0 .  escitalopram (LEXAPRO) 20 MG tablet, Take 1 tablet (20 mg total) by mouth daily., Disp: 30 tablet, Rfl: 0 .  famotidine (PEPCID) 20 MG tablet, Take 1 tablet (20 mg total) by mouth daily., Disp: 30 tablet, Rfl: 0 .  fenofibrate (TRICOR) 145 MG tablet, TAKE 1 TABLET(145 MG) BY MOUTH DAILY, Disp: 90 tablet, Rfl: 1 .  gabapentin (NEURONTIN) 300 MG capsule, Take 1 capsule (300 mg total) by mouth 3 (three) times daily., Disp: 90 capsule, Rfl: 0 .  hydrALAZINE (APRESOLINE) 25 MG tablet, Take 1.5 tablets (37.5 mg total) by mouth every 8 (eight) hours., Disp: 90 tablet, Rfl: 0 .  icosapent Ethyl (VASCEPA) 1 g capsule, Take 2 capsules (2 g total) by mouth 2 (two) times daily., Disp: 360 capsule, Rfl: 0 .  metoprolol succinate (TOPROL-XL) 50 MG 24 hr tablet, TAKE 1 TABLET(50 MG) BY MOUTH DAILY WITH OR IMMEDIATELY FOLLOWING A MEAL, Disp: 90 tablet, Rfl: 3 .  Multiple Vitamin (MULTIVITAMIN ADULT PO), Take 1 tablet by mouth daily., Disp: , Rfl:  .  nortriptyline (PAMELOR) 25 MG capsule, Take 25 mg by mouth at bedtime as needed., Disp: , Rfl:  .  Pirfenidone (ESBRIET) 267 MG TABS, Take 3 tablets (801 mg total) by mouth in the morning, at noon, and at bedtime., Disp: 270 tablet, Rfl: 11 .  SPIRIVA RESPIMAT 1.25 MCG/ACT AERS, INHALE 2 PUFFS INTO THE LUNGS DAILY, Disp: 4 g, Rfl: 5 .  SYMBICORT 160-4.5 MCG/ACT inhaler, Inhale 2 puffs into the lungs 2 (two) times daily., Disp: 1 Inhaler, Rfl: 5 .  TOUJEO SOLOSTAR 300 UNIT/ML Solostar Pen, Inject 12 Units into the skin 2 (two) times daily. 50 units  in th A.M. and  40 units subcutaneous in the P.M., Disp: 1.5 mL, Rfl: 1 .   amLODipine (NORVASC) 10 MG tablet, Take 1 tablet (10 mg total) by mouth daily. (Patient not taking: Reported on 09/03/2020), Disp: 30 tablet, Rfl: 2      Objective:   Vitals:   09/03/20 1451  BP: 122/88  Pulse: 68  Temp: (!) 97.5 F (36.4 C)  TempSrc: Temporal  SpO2: 100%  Weight: 187 lb (84.8 kg)  Height: 5' 3" (1.6 m)    Estimated body mass index is 33.13 kg/m as calculated from the following:   Height as of this encounter: 5' 3" (1.6 m).   Weight as of this encounter: 187 lb (84.8 kg).  _0 @  Filed Weights   09/03/20 1451  Weight: 187 lb (84.8 kg)     Physical Exam  General: No distress. Looks well Neuro: Alert and Oriented x 3. GCS 15. Speech normal./  Jhas walker. Mild pronator drift LUE and Psych: Pleasant Resp:  Barrel Chest - no.  Wheeze - no, Crackles - yes, No overt respiratory distress CVS: Normal heart sounds. Murmurs - no Ext: Stigmata of Connective Tissue Disease - no HEENT: Normal upper airway. PEERL +. No post nasal drip        Assessment:       ICD-10-CM   1. IPF (idiopathic pulmonary fibrosis) ([place])  J84.112   2. Pulmonary emphysema, unspecified emphysema type ([place])  J43.9   3. Encounter for therapeutic drug monitoring  Z51.81   4. Diarrhea due to drug  K52.1        Plan:     Patient Instructions     ICD-10-CM   1. IPF (idiopathic pulmonary fibrosis) ([place])  J84.112   2. Pulmonary emphysema, unspecified emphysema type ([place])  J43.9   3. Encounter for therapeutic drug monitoring  Z51.81   4. Diarrhea due to drug  K52.1     Clinically pulmonary fibrosis is stable.  To bed you had a stroke in January 2022 that is limiting her mobility but glad the stroke was only mild.  There is some amount of diarrhea because of pirfenidone.  However he resolved through shared decision making that he would not change your dose of pirfenidone balancing risk versus side effect versus benefit.  We briefly discussed clinical trials as a care option  is something for you to reflect upon  Plan -Check liver function test today for monitoring of the liver in the setting of pirfenidone -Continue pirfenidone at 3 pills 3 times daily with food -Avoid caffeine and sugars to improve your diarrhea -Continue inhaler therapy Spiriva and Symbicort scheduled for your emphysema [take refills] -Continue physical therapy/rehabilitation to improve from any stroke related deficits -Do high-resolution CT chest supine and prone in 3 months -Do spirometry and DLCO in 3 months  Follow-up -Return in 3 months to see Dr. Chase Caller but after spirometry and DLCO and high-resolution CT chest -30-minute slot         SIGNATURE    Dr. Brand Males, M.D., F.C.C.P,  Pulmonary and Critical Care Medicine Staff Physician, [place] Director - Interstitial Lung Disease  Program  [facility] [place] [facility] [place], [place], [address]  Pager: [phone], If no answer or between  15:00h - 7:00h: call [phone] Telephone: [phone]  3:52 PM 09/03/2020

## 2020-09-03 NOTE — Patient Instructions (Addendum)
ICD-10-CM   1. IPF (idiopathic pulmonary fibrosis) ([place])  J84.112   2. Pulmonary emphysema, unspecified emphysema type ([place])  J43.9   3. Encounter for therapeutic drug monitoring  Z51.81   4. Diarrhea due to drug  K52.1     Clinically pulmonary fibrosis is stable.  To bad you had a stroke in January 2022 that is limiting her mobility but glad the stroke was only mild.  There is some amount of diarrhea because of pirfenidone.  However he resolved through shared decision making that he would not change your dose of pirfenidone balancing risk versus side effect versus benefit.  We briefly discussed clinical trials as a care option is something for you to reflect upon  Plan -Check liver function test today for monitoring of the liver in the setting of pirfenidone -Continue pirfenidone at 3 pills 3 times daily with food -Avoid caffeine and sugars to improve your diarrhea -Continue inhaler therapy Spiriva and Symbicort scheduled for your emphysema [take refills] -Continue physical therapy/rehabilitation to improve from any stroke related deficits -Do high-resolution CT chest supine and prone in 3 months -Do spirometry and DLCO in 3 months  Follow-up -Return in 3 months to see Dr. Chase Caller but after spirometry and DLCO and high-resolution CT chest -30-minute slot

## 2020-09-08 ENCOUNTER — Other Ambulatory Visit: Payer: Self-pay

## 2020-09-08 ENCOUNTER — Ambulatory Visit: Payer: Medicare HMO | Admitting: Cardiology

## 2020-09-08 ENCOUNTER — Encounter: Payer: Self-pay | Admitting: Cardiology

## 2020-09-08 VITALS — BP 140/82 | HR 67 | Temp 98.2°F | Ht 63.0 in | Wt 186.0 lb

## 2020-09-08 DIAGNOSIS — I2510 Atherosclerotic heart disease of native coronary artery without angina pectoris: Secondary | ICD-10-CM

## 2020-09-08 DIAGNOSIS — Z87891 Personal history of nicotine dependence: Secondary | ICD-10-CM

## 2020-09-08 DIAGNOSIS — I129 Hypertensive chronic kidney disease with stage 1 through stage 4 chronic kidney disease, or unspecified chronic kidney disease: Secondary | ICD-10-CM

## 2020-09-08 DIAGNOSIS — N183 Chronic kidney disease, stage 3 unspecified: Secondary | ICD-10-CM

## 2020-09-08 DIAGNOSIS — E119 Type 2 diabetes mellitus without complications: Secondary | ICD-10-CM

## 2020-09-08 DIAGNOSIS — I2584 Coronary atherosclerosis due to calcified coronary lesion: Secondary | ICD-10-CM

## 2020-09-08 DIAGNOSIS — E781 Pure hyperglyceridemia: Secondary | ICD-10-CM

## 2020-09-08 DIAGNOSIS — E782 Mixed hyperlipidemia: Secondary | ICD-10-CM

## 2020-09-08 DIAGNOSIS — Z8673 Personal history of transient ischemic attack (TIA), and cerebral infarction without residual deficits: Secondary | ICD-10-CM

## 2020-09-08 NOTE — Progress Notes (Signed)
Erica [name] Date of Birth: [birth year] MRN: [id number] Primary Care Provider:Avbuere, Christean Grief, MD Former Cardiology Providers: Dr. Adrian Prows, Jeri Lager, APRN, FNP-C Primary Cardiologist: Rex Kras, DO, [facility] (established care 11/19/2019)  Date: 09/08/20 Last Office Visit: 03/23/2020  Chief Complaint  Patient presents with  . Coronary Artery Disease  . Cerebrovascular Accident    HPI   Erica [name] is a 65 y.o.  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    Patient is accompanied by her sister and she provides verbal consent with regards to discussing her medical information in her sister's presence.  Patient was scheduled for a 82-monthfollow-up visit for management of atherosclerosis due to calcified coronary plaques and to address other modifiable cardiovascular risk factors.  However, patient presents for follow-up sooner than her scheduled appointment due to her recent stroke that happened in January 2022.  Patient states that on June 25, 2020 she was experiencing left-sided weakness and difficulty ambulating; however, did not think much of it and went to see her PCP the following day.  Given her symptoms concerning for stroke she was sent to the emergency room for further evaluation and management.  She was found to have an acute stroke involving the right caudate and the right anterolateral thalamus.  She was not deemed an appropriate TPA candidate as she was out of the window.  Patient was hospitalized for majority of January 2022 and then later discharged to inpatient rehab  from which she was discharged home under the care of her family in February 2022.  Patient has completed her 3 weeks of dual antiplatelet therapy and is currently on Plavix.  Given her LDL levels her atorvastatin was increased from 20 to 40 mg p.o. nightly and she is tolerating the medication well without any side effects or intolerances.  Patient is blood pressure at the office is not well controlled; however, given the recent stroke will defer blood pressure management to her neurology and primary care providers at this time.  Ideally would recommend a goal blood pressure less than or equal to 130/80 mmHg if able to tolerate.  Patient does not have any chest pain or anginal equivalent.  She now is walking with a 2 wheel walker and still continues to have left-sided numbness and tingling involving the face and the extremities.  ALLERGIES: Allergies  Allergen Reactions  . Penicillins Hives, Itching and Swelling    Tongue swells up Has patient had a PCN reaction causing immediate rash, facial/tongue/throat swelling, SOB or lightheadedness with hypotension: Yes Has patient had a PCN reaction causing severe rash involving mucus membranes or skin necrosis: Yes Has patient had a PCN reaction that required hospitalization Yes Has patient had a PCN reaction occurring within the last 10 years: No If all of the above answers are "NO", then may proceed with Cephalosporin use.      MEDICATION LIST PRIOR TO VISIT: Current Outpatient Medications on File Prior to Visit  Medication Sig Dispense Refill  . acetaminophen (TYLENOL) 325 MG tablet Take 2 tablets (650 mg total) by mouth every 4 (four) hours as needed for mild pain (or temp > 37.5 C (99.5 F)).    .Marland Kitchen  albuterol (VENTOLIN HFA) 108 (90 Base) MCG/ACT inhaler Inhale 2 puffs into the lungs every 4 (four) hours as needed for wheezing. 1 g 5  . amLODipine (NORVASC) 10 MG tablet Take 1 tablet (10 mg total) by mouth daily. 30 tablet 2  . atorvastatin  (LIPITOR) 40 MG tablet Take 1 tablet (40 mg total) by mouth daily. 30 tablet 0  . busPIRone (BUSPAR) 10 MG tablet Take 1 tablet (10 mg total) by mouth daily at 6 (six) AM. 30 tablet 0  . clopidogrel (PLAVIX) 75 MG tablet Take 1 tablet (75 mg total) by mouth daily. 30 tablet 1  . diclofenac Sodium (VOLTAREN) 1 % GEL Apply 2 g topically 4 (four) times daily. 2 g 0  . escitalopram (LEXAPRO) 20 MG tablet Take 1 tablet (20 mg total) by mouth daily. 30 tablet 0  . famotidine (PEPCID) 20 MG tablet Take 1 tablet (20 mg total) by mouth daily. 30 tablet 0  . fenofibrate (TRICOR) 145 MG tablet TAKE 1 TABLET(145 MG) BY MOUTH DAILY 90 tablet 1  . gabapentin (NEURONTIN) 300 MG capsule Take 1 capsule (300 mg total) by mouth 3 (three) times daily. 90 capsule 0  . hydrALAZINE (APRESOLINE) 25 MG tablet Take 1.5 tablets (37.5 mg total) by mouth every 8 (eight) hours. 90 tablet 0  . icosapent Ethyl (VASCEPA) 1 g capsule Take 2 capsules (2 g total) by mouth 2 (two) times daily. 360 capsule 0  . metoprolol succinate (TOPROL-XL) 50 MG 24 hr tablet TAKE 1 TABLET(50 MG) BY MOUTH DAILY WITH OR IMMEDIATELY FOLLOWING A MEAL 90 tablet 3  . Multiple Vitamin (MULTIVITAMIN ADULT PO) Take 1 tablet by mouth daily.    . nortriptyline (PAMELOR) 25 MG capsule Take 25 mg by mouth at bedtime as needed.    . Pirfenidone (ESBRIET) 267 MG TABS Take 3 tablets (801 mg total) by mouth in the morning, at noon, and at bedtime. 270 tablet 11  . SYMBICORT 160-4.5 MCG/ACT inhaler Inhale 2 puffs into the lungs 2 (two) times daily. 1 each 5  . Tiotropium Bromide Monohydrate (SPIRIVA RESPIMAT) 1.25 MCG/ACT AERS Inhale 2 puffs into the lungs daily. 4 g 5  . TOUJEO SOLOSTAR 300 UNIT/ML Solostar Pen Inject 12 Units into the skin 2 (two) times daily. 50 units  in th A.M. and  40 units subcutaneous in the P.M. 1.5 mL 1  . BD PEN NEEDLE NANO 2ND GEN 32G X 4 MM MISC See admin instructions.    . blood glucose meter kit and supplies Dispense based on patient  and insurance preference. Use up to four times daily as directed. (FOR ICD-10 E10.9, E11.9). 1 each 0  . blood glucose meter kit and supplies Dispense based on patient and insurance preference. Use up to four times daily as directed. (FOR ICD-10 E10.9, E11.9). 1 each 0   No current facility-administered medications on file prior to visit.    PAST MEDICAL HISTORY: Past Medical History:  Diagnosis Date  . Arthritis    especially in shoulders  . Asthma   . Depression   . Diabetes mellitus   . GERD (gastroesophageal reflux disease)   . Headache(784.0)    "mild"  . Hypertension   . Mental disorder    depression  . Neuropathy    feet   . Pain    arthritis pain - takes tramadol as needed  . Rectal polyp    very little bleeding with bowel movements- no pain  . Suicide attempt ([place])  PAST SURGICAL HISTORY: Past Surgical History:  Procedure Laterality Date  . ANAL RECTAL MANOMETRY N/A 07/13/2016   Procedure: ANO RECTAL MANOMETRY;  Surgeon: Leighton Ruff, MD;  Location: [facility];  Service: Endoscopy;  Laterality: N/A;  . CESAREAN SECTION    . EUS N/A 11/21/2012   Procedure: LOWER ENDOSCOPIC ULTRASOUND (EUS);  Surgeon: Arta Silence, MD;  Location: [facility];  Service: Endoscopy;  Laterality: N/A;  . FLEXIBLE SIGMOIDOSCOPY N/A 11/21/2012   Procedure: FLEXIBLE SIGMOIDOSCOPY;  Surgeon: Arta Silence, MD;  Location: [facility];  Service: Endoscopy;  Laterality: N/A;  . FLEXIBLE SIGMOIDOSCOPY N/A 01/28/2013   Procedure: FLEXIBLE SIGMOIDOSCOPY;  Surgeon: Leighton Ruff, MD;  Location: [facility];  Service: Endoscopy;  Laterality: N/A;  . LAPAROSCOPIC LOW ANTERIOR RESECTION N/A 01/29/2013   Procedure: LAPAROSCOPIC LOW ANTERIOR RESECTION, Rigid Proctoscopy;  Surgeon: Leighton Ruff, MD;  Location: [facility];  Service: General;  Laterality: N/A;  . LAPAROSCOPIC SIGMOID COLECTOMY N/A 11/14/2012   Procedure: DIAGNOSTIC LAPAROSCOPY AND SIGMOIDMOIDOSCOPY ;  Surgeon: Rolm Bookbinder, MD;   Location: [facility];  Service: General;  Laterality: N/A;  . RECTAL ULTRASOUND N/A 07/13/2016   Procedure: RECTAL ULTRASOUND;  Surgeon: Leighton Ruff, MD;  Location: [facility];  Service: Endoscopy;  Laterality: N/A;  . TONSILLECTOMY    . TONSILLECTOMY AND ADENOIDECTOMY      FAMILY HISTORY: The patient's family history includes Cancer in her maternal aunt, maternal uncle, maternal uncle, maternal uncle, paternal aunt, and paternal uncle; Cancer (age of onset: 23) in an other family member; Diabetes in her father; Heart disease in her mother; Hyperlipidemia in her mother; Hypertension in her brother, brother, father, mother, sister, and sister; Stroke in her father.   SOCIAL HISTORY:  The patient  reports that she quit smoking about 2 years ago. Her smoking use included cigarettes. She has a 10.00 pack-year smoking history. She has never used smokeless tobacco. She reports previous alcohol use. She reports previous drug use. Drug: "Crack" cocaine.  Review of Systems  Constitutional: Negative for chills and fever.  HENT: Negative for hoarse voice and nosebleeds.   Eyes: Negative for discharge, double vision and pain.  Cardiovascular: Negative for chest pain, claudication, leg swelling, near-syncope, orthopnea, palpitations, paroxysmal nocturnal dyspnea and syncope.  Respiratory: Positive for shortness of breath (chronic and stable). Negative for hemoptysis.   Musculoskeletal: Negative for muscle cramps and myalgias.  Gastrointestinal: Negative for abdominal pain, constipation, diarrhea, hematemesis, hematochezia, melena, nausea and vomiting.  Neurological: Positive for light-headedness (chronic and stable), numbness (left sided face and LUE. ) and paresthesias (left sided face and LUE. ). Negative for dizziness.    PHYSICAL EXAM: Vitals with BMI 09/08/2020 09/03/2020 08/19/2020  Height _0  _1  _2   Weight 186 lbs 187 lbs 187 lbs  BMI 32.96 73.22 02.54  Systolic 270 623 762  Diastolic 82  88 78  Pulse 67 68 68    CONSTITUTIONAL: Appears older than stated age.  Hemodynamically stable.  well-nourished. No acute distress.  SKIN: Skin is warm and dry. No rash noted. No cyanosis. No pallor. No jaundice HEAD: Normocephalic and atraumatic.  EYES: No scleral icterus MOUTH/THROAT: Moist oral membranes.  NECK: No JVD present. No thyromegaly noted. No carotid bruits  LYMPHATIC: No visible cervical adenopathy.  CHEST Normal respiratory effort. No intercostal retractions  LUNGS: Clear to auscultation bilaterally.  No stridor. No wheezes. No rales.  CARDIOVASCULAR: Regular rate and rhythm, positive S1-S2, no murmurs rubs or gallops appreciated. ABDOMINAL: Obese, soft, nontender, nondistended, positive bowel sounds all 4 quadrants.  No apparent ascites.  EXTREMITIES: No peripheral edema  HEMATOLOGIC: No significant bruising NEUROLOGIC: Oriented to person, place, and time.  5 out of 5 strength in the right upper and lower extremity.  4 out of 5 strength in the left upper and lower extremity. PSYCHIATRIC: Normal mood and affect. Normal behavior. Cooperative  RADIOLOGY: CT of chest 04/12/2019:  1. The appearance of the lungs remains compatible with interstitial lung disease, with a spectrum of findings considered diagnostic of usual interstitial pneumonia (UIP) per current ATS guidelines. Minimal progression compared to the prior study. 2. Aortic atherosclerosis, in addition to left anterior descending coronary artery disease.  3. Cholelithiasis. Aortic Atherosclerosis (ICD10-I70.0).  CT head without contrast 06/25/2020: 1. Lacunar infarcts in the right caudate and right cerebellum, new from 2012, but age indeterminate. 2. No hemorrhage or evidence of large or territorial ischemia. 3. Chronic left sphenoid sinusitis.  MRI brain without contrast 06/25/2020: 1. 1 cm acute infarction in the anterolateral thalamus on the right, possibly with some involvement of the posterior limb internal  capsule. Acute infarction within the right caudate head. Scattered other foci of high signal on diffusion imaging within the hemispheric white matter, not showing restricted diffusion on the ADC map, and therefore probably representing T2 shine through from old white matter infarctions. 2. Extensive old small vessel infarctions throughout the brain as outlined above. Small old cortical infarctions in the right frontal lobe and both parieto-occipital regions. 3. No large or medium vessel occlusion or correctable proximal stenosis. Mild atherosclerotic irregularity of the PCA branches.  MR cervical spine without contrast 06/26/2020 1. 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 Mild generalized atrophy of the upper cervical spinal cord. 2. Moderate C6-7 and mild C5-6 and C7-T1 spinal canal stenosis secondary to central disc protrusions.  CARDIAC DATABASE: EKG: 06/25/2020: Sinus rhythm. Probable left atrial enlargement. Low voltage, precordial leads. Anteroseptal infarct, old.   Echocardiogram: 06/26/2020: Left ventricular ejection fraction, by estimation, is 55 to 60%. The left ventricle has normal function. The left ventricle has no regional wall motion abnormalities. Left ventricular diastolic parameters are indeterminate. Right ventricular systolic function is normal. The right ventricular size is normal. The mitral valve is normal in structure. No evidence of mitral valve regurgitation. The aortic valve is tricuspid. Aortic valve regurgitation is trivial. No aortic stenosis is present. The inferior vena cava is normal in size with greater than 50% respiratory variability, suggesting right atrial pressure of 3 mmHg.  Stress Testing:  Lexiscan tetrofosmin stress test 07/22/2019: There is  apical thinning an mild soft tissue attenuation artifact in inferior wall without ischemia or infarct. All segments of left ventricle demonstrated normal wall motion and  thickening. Stress LV EF is moderately dysfunctional 39%, however visually appearted to be normal. No previous exam available for comparison. Low risk study.   Heart Catheterization: None  LABORATORY DATA: CBC Latest Ref Rng & Units 07/29/2020 07/13/2020 07/10/2020  WBC 3.8 - 10.8 Thousand/uL 6.5 6.8 4.0  Hemoglobin 11.7 - 15.5 g/dL 10.2(L) 10.7(L) 9.7(L)  Hematocrit 35.0 - 45.0 % 29.9(L) 30.6(L) 28.0(L)  Platelets 140 - 400 Thousand/uL 141 146(L) 130(L)    CMP Latest Ref Rng & Units 09/03/2020 07/29/2020 07/14/2020  Glucose 65 - 99 mg/dL - 49(L) -  BUN 7 - 25 mg/dL - 18 -  Creatinine 0.50 - 0.99 mg/dL - 1.53(H) 1.91(H)  Sodium 135 - 146 mmol/L - 144 -  Potassium 3.5 - 5.3 mmol/L - 4.6 -  Chloride 98 - 110 mmol/L -  114(H) -  CO2 20 - 32 mmol/L - 23 -  Calcium 8.6 - 10.4 mg/dL - 8.9 -  Total Protein 6.0 - 8.3 g/dL 7.6 - -  Total Bilirubin 0.2 - 1.2 mg/dL 0.9 - -  Alkaline Phos 39 - 117 U/L 51 - -  AST 0 - 37 U/L 26 - -  ALT 0 - 35 U/L 16 - -    Lipid Panel  Lab Results  Component Value Date   CHOL 155 06/26/2020   HDL 43 06/26/2020   LDLCALC 86 06/26/2020   LDLDIRECT 56 04/23/2020   TRIG 130 06/26/2020   CHOLHDL 3.6 06/26/2020   Lab Results  Component Value Date   HGBA1C 5.2 06/26/2020   HGBA1C 7.9 (H) 05/11/2019   No components found for: NTPROBNP Lab Results  Component Value Date   TSH 3.642 06/26/2020   TSH 3.53 06/25/2020   TSH 2.562 08/21/2008    Cardiac Panel (last 3 results) No results for input(s): CKTOTAL, CKMB, TROPONINIHS, RELINDX in the last 72 hours.  IMPRESSION:    ICD-10-CM   1. Coronary atherosclerosis due to calcified coronary lesion of native artery  I25.10    I25.84   2. Ischemic stroke (s/p lacunar infarcts right caudate & right anterolateral thalamus)  Z86.73 LONG TERM MONITOR-LIVE TELEMETRY (3-14 DAYS)  3. Mixed hyperlipidemia  E78.2   4. Hypertriglyceridemia  E78.1   5. Benign hypertension with CKD (chronic kidney disease) stage III (HCC)   I12.9    N18.30   6. Non-insulin dependent type 2 diabetes mellitus ([place])  E11.9   7. Former tobacco use  Z87.891      RECOMMENDATIONS: Erica [name] is a 65 y.o. female whose past medical history and cardiovascular risk factors include: Recent stroke involving the right caudate nucleus and right anterolateral thalamus (March 2022), rectal CA in 2014 that is in remission, T2DM, former tobacco use, emphysema, interstitial lung disease, idiopathic pulmonary fibrosis, and coronary artery calcification on CT scan.    Status post ischemic stroke with residual deficits:  Based on the imaging most recent stroke involved the right caudate and the right anterolateral region of the thalamus. She is also noted to have "extensive old small vessel infarctions throughout the brain. Small old cortical infarctions in the right frontal lobe and both parieto-occipital regions."  Most recent echocardiogram noted preserved LVEF and therefore the likelihood of an LV thrombus is low.  Discussed undergoing loop recorder implantation versus mobile cardiac ambulatory telemetry.  Shared decision was to proceed with a 14-day mobile cardiac amatory telemetry to evaluate for possible atrial fibrillation.  Educated on the importance of secondary prevention:  Continue Plavix 75 mg p.o. daily.  Atorvastatin increased to 40 mg p.o. q. at bedtime, during her last hospitalization.  Goal LDL less than 70 mg/dL.  Most recent hemoglobin A1c within acceptable range.  Recommend better blood pressure management.  Per last neurology note goal blood pressure is <130/90.  Patient states that her blood pressure medications are managed by PCP.   Coronary artery calcification:  Continue Plavix and statin.  Patient has undergone appropriate cardiovascular work-up including an echocardiogram and stress test results noted above.    Continue to monitor.    Hyperlipidemia:  Continue atorvastatin 40 mg p.o. nightly.  Most recent  lipid profile reviewed.  She does not endorse any myalgias.  Recommend a goal LDL less than 70 mg/dL.  Hypertriglyceridemia:  Currently on TriCor and Vascepa.    Educated on the importance of dietary restrictions.  Continue current medical therapy.    Benign essential hypertension with chronic kidney disease stage III . Office blood pressure are improving. . Medications were difficult to reconcile at today's visit. . Patient is asked to keep a log of her blood pressures and to review it with her PCP.  Currently managed by primary care provider.   FINAL MEDICATION LIST END OF ENCOUNTER: No orders of the defined types were placed in this encounter.    Current Outpatient Medications:  .  acetaminophen (TYLENOL) 325 MG tablet, Take 2 tablets (650 mg total) by mouth every 4 (four) hours as needed for mild pain (or temp > 37.5 C (99.5 F))., Disp: , Rfl:  .  albuterol (VENTOLIN HFA) 108 (90 Base) MCG/ACT inhaler, Inhale 2 puffs into the lungs every 4 (four) hours as needed for wheezing., Disp: 1 g, Rfl: 5 .  amLODipine (NORVASC) 10 MG tablet, Take 1 tablet (10 mg total) by mouth daily., Disp: 30 tablet, Rfl: 2 .  atorvastatin (LIPITOR) 40 MG tablet, Take 1 tablet (40 mg total) by mouth daily., Disp: 30 tablet, Rfl: 0 .  busPIRone (BUSPAR) 10 MG tablet, Take 1 tablet (10 mg total) by mouth daily at 6 (six) AM., Disp: 30 tablet, Rfl: 0 .  clopidogrel (PLAVIX) 75 MG tablet, Take 1 tablet (75 mg total) by mouth daily., Disp: 30 tablet, Rfl: 1 .  diclofenac Sodium (VOLTAREN) 1 % GEL, Apply 2 g topically 4 (four) times daily., Disp: 2 g, Rfl: 0 .  escitalopram (LEXAPRO) 20 MG tablet, Take 1 tablet (20 mg total) by mouth daily., Disp: 30 tablet, Rfl: 0 .  famotidine (PEPCID) 20 MG tablet, Take 1 tablet (20 mg total) by mouth daily., Disp: 30 tablet, Rfl: 0 .  fenofibrate (TRICOR) 145 MG tablet, TAKE 1 TABLET(145 MG) BY MOUTH DAILY, Disp: 90 tablet, Rfl: 1 .  gabapentin (NEURONTIN) 300 MG  capsule, Take 1 capsule (300 mg total) by mouth 3 (three) times daily., Disp: 90 capsule, Rfl: 0 .  hydrALAZINE (APRESOLINE) 25 MG tablet, Take 1.5 tablets (37.5 mg total) by mouth every 8 (eight) hours., Disp: 90 tablet, Rfl: 0 .  icosapent Ethyl (VASCEPA) 1 g capsule, Take 2 capsules (2 g total) by mouth 2 (two) times daily., Disp: 360 capsule, Rfl: 0 .  metoprolol succinate (TOPROL-XL) 50 MG 24 hr tablet, TAKE 1 TABLET(50 MG) BY MOUTH DAILY WITH OR IMMEDIATELY FOLLOWING A MEAL, Disp: 90 tablet, Rfl: 3 .  Multiple Vitamin (MULTIVITAMIN ADULT PO), Take 1 tablet by mouth daily., Disp: , Rfl:  .  nortriptyline (PAMELOR) 25 MG capsule, Take 25 mg by mouth at bedtime as needed., Disp: , Rfl:  .  Pirfenidone (ESBRIET) 267 MG TABS, Take 3 tablets (801 mg total) by mouth in the morning, at noon, and at bedtime., Disp: 270 tablet, Rfl: 11 .  SYMBICORT 160-4.5 MCG/ACT inhaler, Inhale 2 puffs into the lungs 2 (two) times daily., Disp: 1 each, Rfl: 5 .  Tiotropium Bromide Monohydrate (SPIRIVA RESPIMAT) 1.25 MCG/ACT AERS, Inhale 2 puffs into the lungs daily., Disp: 4 g, Rfl: 5 .  TOUJEO SOLOSTAR 300 UNIT/ML Solostar Pen, Inject 12 Units into the skin 2 (two) times daily. 50 units  in th A.M. and  40 units subcutaneous in the P.M., Disp: 1.5 mL, Rfl: 1 .  BD PEN NEEDLE NANO 2ND GEN 32G X 4 MM MISC, See admin instructions., Disp: , Rfl:  .  blood glucose meter kit and supplies, Dispense based on patient and insurance preference. Use  up to four times daily as directed. (FOR ICD-10 E10.9, E11.9)., Disp: 1 each, Rfl: 0 .  blood glucose meter kit and supplies, Dispense based on patient and insurance preference. Use up to four times daily as directed. (FOR ICD-10 E10.9, E11.9)., Disp: 1 each, Rfl: 0  Orders Placed This Encounter  Procedures  . LONG TERM MONITOR-LIVE TELEMETRY (3-14 DAYS)   --Continue cardiac medications as reconciled in final medication list. --Return in about 3 months (around 12/09/2020) for Follow  up, Coronary artery calcification, recent CVA, monitor results.. Or sooner if needed. --Continue follow-up with your primary care physician regarding the management of your other chronic comorbid conditions.  Total encounter time 48 minutes. *Total Encounter Time as defined by the Centers for Medicare and Medicaid Services includes, in addition to the face-to-face time of a patient visit (documented in the note above) non-face-to-face time: obtaining and reviewing outside history, ordering and reviewing medications, tests or procedures, care coordination (communications with other health care professionals or caregivers) and documentation in the medical record.  Patient's questions and concerns were addressed to her satisfaction. She voices understanding of the instructions provided during this encounter.   This note was created using a voice recognition software as a result there may be grammatical errors inadvertently enclosed that do not reflect the nature of this encounter. Every attempt is made to correct such errors.  Rex Kras, [place], [facility]  Pager: [phone] Office: [phone]

## 2020-09-10 ENCOUNTER — Other Ambulatory Visit (HOSPITAL_COMMUNITY): Payer: Self-pay

## 2020-10-15 ENCOUNTER — Encounter: Payer: Medicare HMO | Attending: Registered Nurse | Admitting: Registered Nurse

## 2020-10-15 ENCOUNTER — Other Ambulatory Visit: Payer: Self-pay

## 2020-10-15 ENCOUNTER — Inpatient Hospital Stay: Payer: Medicare HMO

## 2020-10-15 VITALS — BP 140/77 | HR 73 | Temp 98.2°F | Ht 63.0 in | Wt 183.6 lb

## 2020-10-15 DIAGNOSIS — E785 Hyperlipidemia, unspecified: Secondary | ICD-10-CM | POA: Diagnosis present

## 2020-10-15 DIAGNOSIS — I10 Essential (primary) hypertension: Secondary | ICD-10-CM | POA: Diagnosis present

## 2020-10-15 DIAGNOSIS — E1142 Type 2 diabetes mellitus with diabetic polyneuropathy: Secondary | ICD-10-CM | POA: Diagnosis present

## 2020-10-15 DIAGNOSIS — I639 Cerebral infarction, unspecified: Secondary | ICD-10-CM | POA: Diagnosis present

## 2020-10-15 DIAGNOSIS — Z8673 Personal history of transient ischemic attack (TIA), and cerebral infarction without residual deficits: Secondary | ICD-10-CM

## 2020-10-15 DIAGNOSIS — I6381 Other cerebral infarction due to occlusion or stenosis of small artery: Secondary | ICD-10-CM

## 2020-10-15 NOTE — Progress Notes (Signed)
Subjective:    Patient ID: Erica [name], female    DOB: [birth year], 65 y.o.   MRN: [id number]  HPI: Erica [name] is a 65 y.o. female who returns for follow up of her Right Thalamic Infarction. She reports left temporal pain. She rates her pain 5. Her current exercise regime is walking with her walker and receiving home therapy with [facility].   Sister in room all questions answered.    Pain Inventory Average Pain 5 Pain Right Now 5 My pain is tingling  In the last 24 hours, has pain interfered with the following? General activity 5 Relation with others 5 Enjoyment of life 5 What TIME of day is your pain at its worst? evening Sleep (in general) Good  Pain is worse with: standing and unsure Pain improves with: none Relief from Meds: 0  Family History  Problem Relation Age of Onset  . Hypertension Father   . Stroke Father   . Diabetes Father   . Heart disease Mother   . Hypertension Mother   . Hyperlipidemia Mother   . Hypertension Sister   . Hypertension Brother   . Hypertension Sister   . Hypertension Brother   . Cancer Other 47       colon cancer   . Cancer Maternal Aunt        cancer, unknown type   . Cancer Maternal Uncle        bone cancer   . Cancer Paternal Aunt        lung cancer  . Cancer Paternal Uncle        lung cancer  . Cancer Maternal Uncle        colon cancer and prostate cancer   . Cancer Maternal Uncle        prostate cancer    Social History   Socioeconomic History  . Marital status: Single    Spouse name: Not on file  . Number of children: 1  . Years of education: Not on file  . Highest education level: Not on file  Occupational History  . Not on file  Tobacco Use  . Smoking status: Former Smoker    Packs/day: 0.50    Years: 20.00    Pack years: 10.00    Types: Cigarettes    Quit date: 01/05/2018    Years since quitting: 2.7  . Smokeless tobacco: Never Used  . Tobacco comment: Unknown   Vaping Use  . Vaping Use:  Never used  Substance and Sexual Activity  . Alcohol use: Not Currently  . Drug use: Not Currently    Types: "Crack" cocaine    Comment: Unknown   . Sexual activity: Never    Birth control/protection: None  Other Topics Concern  . Not on file  Social History Narrative  . Not on file   Social Determinants of Health   Financial Resource Strain: Not on file  Food Insecurity: Not on file  Transportation Needs: Not on file  Physical Activity: Not on file  Stress: Not on file  Social Connections: Not on file   Past Surgical History:  Procedure Laterality Date  . ANAL RECTAL MANOMETRY N/A 07/13/2016   Procedure: ANO RECTAL MANOMETRY;  Surgeon: Leighton Ruff, MD;  Location: [facility];  Service: Endoscopy;  Laterality: N/A;  . CESAREAN SECTION    . EUS N/A 11/21/2012   Procedure: LOWER ENDOSCOPIC ULTRASOUND (EUS);  Surgeon: Arta Silence, MD;  Location: [facility];  Service: Endoscopy;  Laterality: N/A;  .  FLEXIBLE SIGMOIDOSCOPY N/A 11/21/2012   Procedure: FLEXIBLE SIGMOIDOSCOPY;  Surgeon: Arta Silence, MD;  Location: [facility];  Service: Endoscopy;  Laterality: N/A;  . FLEXIBLE SIGMOIDOSCOPY N/A 01/28/2013   Procedure: FLEXIBLE SIGMOIDOSCOPY;  Surgeon: Leighton Ruff, MD;  Location: [facility];  Service: Endoscopy;  Laterality: N/A;  . LAPAROSCOPIC LOW ANTERIOR RESECTION N/A 01/29/2013   Procedure: LAPAROSCOPIC LOW ANTERIOR RESECTION, Rigid Proctoscopy;  Surgeon: Leighton Ruff, MD;  Location: [facility];  Service: General;  Laterality: N/A;  . LAPAROSCOPIC SIGMOID COLECTOMY N/A 11/14/2012   Procedure: DIAGNOSTIC LAPAROSCOPY AND SIGMOIDMOIDOSCOPY ;  Surgeon: Rolm Bookbinder, MD;  Location: [facility];  Service: General;  Laterality: N/A;  . RECTAL ULTRASOUND N/A 07/13/2016   Procedure: RECTAL ULTRASOUND;  Surgeon: Leighton Ruff, MD;  Location: [facility];  Service: Endoscopy;  Laterality: N/A;  . TONSILLECTOMY    . TONSILLECTOMY AND ADENOIDECTOMY     Past Surgical History:   Procedure Laterality Date  . ANAL RECTAL MANOMETRY N/A 07/13/2016   Procedure: ANO RECTAL MANOMETRY;  Surgeon: Leighton Ruff, MD;  Location: [facility];  Service: Endoscopy;  Laterality: N/A;  . CESAREAN SECTION    . EUS N/A 11/21/2012   Procedure: LOWER ENDOSCOPIC ULTRASOUND (EUS);  Surgeon: Arta Silence, MD;  Location: [facility];  Service: Endoscopy;  Laterality: N/A;  . FLEXIBLE SIGMOIDOSCOPY N/A 11/21/2012   Procedure: FLEXIBLE SIGMOIDOSCOPY;  Surgeon: Arta Silence, MD;  Location: [facility];  Service: Endoscopy;  Laterality: N/A;  . FLEXIBLE SIGMOIDOSCOPY N/A 01/28/2013   Procedure: FLEXIBLE SIGMOIDOSCOPY;  Surgeon: Leighton Ruff, MD;  Location: [facility];  Service: Endoscopy;  Laterality: N/A;  . LAPAROSCOPIC LOW ANTERIOR RESECTION N/A 01/29/2013   Procedure: LAPAROSCOPIC LOW ANTERIOR RESECTION, Rigid Proctoscopy;  Surgeon: Leighton Ruff, MD;  Location: [facility];  Service: General;  Laterality: N/A;  . LAPAROSCOPIC SIGMOID COLECTOMY N/A 11/14/2012   Procedure: DIAGNOSTIC LAPAROSCOPY AND SIGMOIDMOIDOSCOPY ;  Surgeon: Rolm Bookbinder, MD;  Location: [facility];  Service: General;  Laterality: N/A;  . RECTAL ULTRASOUND N/A 07/13/2016   Procedure: RECTAL ULTRASOUND;  Surgeon: Leighton Ruff, MD;  Location: [facility];  Service: Endoscopy;  Laterality: N/A;  . TONSILLECTOMY    . TONSILLECTOMY AND ADENOIDECTOMY     Past Medical History:  Diagnosis Date  . Arthritis    especially in shoulders  . Asthma   . Depression   . Diabetes mellitus   . GERD (gastroesophageal reflux disease)   . Headache(784.0)    "mild"  . Hypertension   . Mental disorder    depression  . Neuropathy    feet   . Pain    arthritis pain - takes tramadol as needed  . Rectal polyp    very little bleeding with bowel movements- no pain  . Suicide attempt ([place])    BP 140/77   Pulse 73   Temp 98.2 F (36.8 C)   Ht 5' 3" (1.6 m)   Wt 183 lb 9.6 oz (83.3 kg)   SpO2 96%   BMI 32.52 kg/m   Opioid Risk  Score:   Fall Risk Score:  `1  Depression screen PHQ 2/9  Depression screen PHQ 2/9 07/29/2020  Decreased Interest 3  Down, Depressed, Hopeless 3  PHQ - 2 Score 6  Altered sleeping 3  Tired, decreased energy 3  Change in appetite 3  Feeling bad or failure about yourself  3  Trouble concentrating 3  Moving slowly or fidgety/restless 3  Suicidal thoughts 1  PHQ-9 Score 25  Some recent data might be hidden  Review of Systems  Neurological: Positive for headaches.  All other systems reviewed and are negative.      Objective:   Physical Exam Vitals and nursing note reviewed.  Constitutional:      Appearance: Normal appearance.  Cardiovascular:     Rate and Rhythm: Normal rate and regular rhythm.     Pulses: Normal pulses.     Heart sounds: Normal heart sounds.  Pulmonary:     Effort: Pulmonary effort is normal.     Breath sounds: Normal breath sounds.  Musculoskeletal:     Cervical back: Normal range of motion and neck supple.     Comments: Normal Muscle Bulk and Muscle Testing Reveals:  Upper Extremities:  Full ROM and Muscle Strength 5/5 and Left Muscle Strength 4/5 Lower Extremities: Right: Full ROM and Muscle Strength 5/5 Left Lower Extremity: Full ROM and Muscle Strength 4/5 Arises from chair slowly using walker for support Narrow Based Gait   Skin:    General: Skin is warm and dry.  Neurological:     Mental Status: She is alert and oriented to person, place, and time.  Psychiatric:        Mood and Affect: Mood normal.        Behavior: Behavior normal.           Assessment & Plan:  1. Right Thalamic Infarction: Continue Home Health Therapy with [facility]. Neurology Following.. Continue current medication regimen. Continue to monitor. 10/15/2020 2.Essential Hypertension: Continue current medication regimen. Continue to monitor. PCP Following. 10/15/2020 3. Dyslipidemia: Continue current medication regimen. PCP Following. Continue to Monitor.  10/15/2020 4. Diabetic Peripheral neuropathy. Continue current medication regimen. PCP Following. Continue to monitor. 05/-10/2020  F/U in 4- 6 weeks

## 2020-10-18 ENCOUNTER — Encounter: Payer: Self-pay | Admitting: Registered Nurse

## 2020-11-03 ENCOUNTER — Emergency Department (HOSPITAL_COMMUNITY): Payer: Medicare HMO

## 2020-11-03 ENCOUNTER — Encounter (HOSPITAL_COMMUNITY): Payer: Self-pay | Admitting: Family Medicine

## 2020-11-03 ENCOUNTER — Inpatient Hospital Stay (HOSPITAL_COMMUNITY)
Admission: EM | Admit: 2020-11-03 | Discharge: 2020-11-06 | DRG: 065 | Disposition: A | Discharge status: Rehabilitation Facility | Payer: Medicare HMO | Attending: Student | Admitting: Student

## 2020-11-03 ENCOUNTER — Other Ambulatory Visit: Payer: Self-pay

## 2020-11-03 ENCOUNTER — Observation Stay (HOSPITAL_COMMUNITY): Payer: Medicare HMO

## 2020-11-03 DIAGNOSIS — G8194 Hemiplegia, unspecified affecting left nondominant side: Secondary | ICD-10-CM | POA: Diagnosis present

## 2020-11-03 DIAGNOSIS — K219 Gastro-esophageal reflux disease without esophagitis: Secondary | ICD-10-CM | POA: Diagnosis present

## 2020-11-03 DIAGNOSIS — I6381 Other cerebral infarction due to occlusion or stenosis of small artery: Principal | ICD-10-CM | POA: Diagnosis present

## 2020-11-03 DIAGNOSIS — E1142 Type 2 diabetes mellitus with diabetic polyneuropathy: Secondary | ICD-10-CM | POA: Diagnosis present

## 2020-11-03 DIAGNOSIS — R531 Weakness: Secondary | ICD-10-CM | POA: Diagnosis present

## 2020-11-03 DIAGNOSIS — Z8249 Family history of ischemic heart disease and other diseases of the circulatory system: Secondary | ICD-10-CM

## 2020-11-03 DIAGNOSIS — Z87891 Personal history of nicotine dependence: Secondary | ICD-10-CM

## 2020-11-03 DIAGNOSIS — E669 Obesity, unspecified: Secondary | ICD-10-CM | POA: Diagnosis present

## 2020-11-03 DIAGNOSIS — I639 Cerebral infarction, unspecified: Secondary | ICD-10-CM | POA: Diagnosis not present

## 2020-11-03 DIAGNOSIS — F419 Anxiety disorder, unspecified: Secondary | ICD-10-CM | POA: Diagnosis present

## 2020-11-03 DIAGNOSIS — M19011 Primary osteoarthritis, right shoulder: Secondary | ICD-10-CM | POA: Diagnosis present

## 2020-11-03 DIAGNOSIS — R200 Anesthesia of skin: Secondary | ICD-10-CM | POA: Diagnosis present

## 2020-11-03 DIAGNOSIS — Z833 Family history of diabetes mellitus: Secondary | ICD-10-CM

## 2020-11-03 DIAGNOSIS — Z880 Allergy status to penicillin: Secondary | ICD-10-CM

## 2020-11-03 DIAGNOSIS — Z20822 Contact with and (suspected) exposure to covid-19: Secondary | ICD-10-CM | POA: Diagnosis present

## 2020-11-03 DIAGNOSIS — Z823 Family history of stroke: Secondary | ICD-10-CM

## 2020-11-03 DIAGNOSIS — I5032 Chronic diastolic (congestive) heart failure: Secondary | ICD-10-CM | POA: Diagnosis present

## 2020-11-03 DIAGNOSIS — Z79899 Other long term (current) drug therapy: Secondary | ICD-10-CM

## 2020-11-03 DIAGNOSIS — Z7951 Long term (current) use of inhaled steroids: Secondary | ICD-10-CM

## 2020-11-03 DIAGNOSIS — Z801 Family history of malignant neoplasm of trachea, bronchus and lung: Secondary | ICD-10-CM

## 2020-11-03 DIAGNOSIS — R001 Bradycardia, unspecified: Secondary | ICD-10-CM | POA: Diagnosis not present

## 2020-11-03 DIAGNOSIS — N1832 Chronic kidney disease, stage 3b: Secondary | ICD-10-CM | POA: Diagnosis present

## 2020-11-03 DIAGNOSIS — R29701 NIHSS score 1: Secondary | ICD-10-CM | POA: Diagnosis present

## 2020-11-03 DIAGNOSIS — Z8673 Personal history of transient ischemic attack (TIA), and cerebral infarction without residual deficits: Secondary | ICD-10-CM

## 2020-11-03 DIAGNOSIS — E1122 Type 2 diabetes mellitus with diabetic chronic kidney disease: Secondary | ICD-10-CM | POA: Diagnosis present

## 2020-11-03 DIAGNOSIS — J449 Chronic obstructive pulmonary disease, unspecified: Secondary | ICD-10-CM | POA: Diagnosis present

## 2020-11-03 DIAGNOSIS — F32A Depression, unspecified: Secondary | ICD-10-CM | POA: Diagnosis present

## 2020-11-03 DIAGNOSIS — E1159 Type 2 diabetes mellitus with other circulatory complications: Secondary | ICD-10-CM | POA: Diagnosis present

## 2020-11-03 DIAGNOSIS — J84112 Idiopathic pulmonary fibrosis: Secondary | ICD-10-CM | POA: Diagnosis present

## 2020-11-03 DIAGNOSIS — D61818 Other pancytopenia: Secondary | ICD-10-CM | POA: Diagnosis present

## 2020-11-03 DIAGNOSIS — G950 Syringomyelia and syringobulbia: Secondary | ICD-10-CM | POA: Diagnosis present

## 2020-11-03 DIAGNOSIS — I10 Essential (primary) hypertension: Secondary | ICD-10-CM | POA: Diagnosis present

## 2020-11-03 DIAGNOSIS — I130 Hypertensive heart and chronic kidney disease with heart failure and stage 1 through stage 4 chronic kidney disease, or unspecified chronic kidney disease: Secondary | ICD-10-CM | POA: Diagnosis present

## 2020-11-03 DIAGNOSIS — F332 Major depressive disorder, recurrent severe without psychotic features: Secondary | ICD-10-CM | POA: Diagnosis not present

## 2020-11-03 DIAGNOSIS — M19012 Primary osteoarthritis, left shoulder: Secondary | ICD-10-CM | POA: Diagnosis present

## 2020-11-03 DIAGNOSIS — Z83438 Family history of other disorder of lipoprotein metabolism and other lipidemia: Secondary | ICD-10-CM

## 2020-11-03 DIAGNOSIS — E1165 Type 2 diabetes mellitus with hyperglycemia: Secondary | ICD-10-CM | POA: Diagnosis present

## 2020-11-03 DIAGNOSIS — K621 Rectal polyp: Secondary | ICD-10-CM | POA: Diagnosis present

## 2020-11-03 DIAGNOSIS — Z6832 Body mass index (BMI) 32.0-32.9, adult: Secondary | ICD-10-CM

## 2020-11-03 DIAGNOSIS — I2510 Atherosclerotic heart disease of native coronary artery without angina pectoris: Secondary | ICD-10-CM | POA: Diagnosis present

## 2020-11-03 DIAGNOSIS — C20 Malignant neoplasm of rectum: Secondary | ICD-10-CM | POA: Diagnosis present

## 2020-11-03 DIAGNOSIS — D649 Anemia, unspecified: Secondary | ICD-10-CM | POA: Diagnosis present

## 2020-11-03 DIAGNOSIS — Z85048 Personal history of other malignant neoplasm of rectum, rectosigmoid junction, and anus: Secondary | ICD-10-CM

## 2020-11-03 DIAGNOSIS — M1990 Unspecified osteoarthritis, unspecified site: Secondary | ICD-10-CM | POA: Diagnosis present

## 2020-11-03 DIAGNOSIS — E785 Hyperlipidemia, unspecified: Secondary | ICD-10-CM | POA: Diagnosis present

## 2020-11-03 DIAGNOSIS — J439 Emphysema, unspecified: Secondary | ICD-10-CM | POA: Diagnosis not present

## 2020-11-03 DIAGNOSIS — Z800 Family history of malignant neoplasm of digestive organs: Secondary | ICD-10-CM

## 2020-11-03 HISTORY — DX: Cerebral infarction, unspecified: I63.9

## 2020-11-03 LAB — CBC WITH DIFFERENTIAL/PLATELET
Abs Immature Granulocytes: 0.03 10*3/uL (ref 0.00–0.07)
Basophils Absolute: 0 10*3/uL (ref 0.0–0.1)
Basophils Relative: 0 %
Eosinophils Absolute: 0.1 10*3/uL (ref 0.0–0.5)
Eosinophils Relative: 2 %
HCT: 29.8 % — ABNORMAL LOW (ref 36.0–46.0)
Hemoglobin: 10.5 g/dL — ABNORMAL LOW (ref 12.0–15.0)
Immature Granulocytes: 1 %
Lymphocytes Relative: 25 %
Lymphs Abs: 1.3 10*3/uL (ref 0.7–4.0)
MCH: 32.6 pg (ref 26.0–34.0)
MCHC: 35.2 g/dL (ref 30.0–36.0)
MCV: 92.5 fL (ref 80.0–100.0)
Monocytes Absolute: 0.3 10*3/uL (ref 0.1–1.0)
Monocytes Relative: 6 %
Neutro Abs: 3.4 10*3/uL (ref 1.7–7.7)
Neutrophils Relative %: 66 %
Platelets: 147 10*3/uL — ABNORMAL LOW (ref 150–400)
RBC: 3.22 MIL/uL — ABNORMAL LOW (ref 3.87–5.11)
RDW: 16.3 % — ABNORMAL HIGH (ref 11.5–15.5)
WBC: 5.1 10*3/uL (ref 4.0–10.5)
nRBC: 0 % (ref 0.0–0.2)

## 2020-11-03 LAB — URINALYSIS, ROUTINE W REFLEX MICROSCOPIC
Bilirubin Urine: NEGATIVE
Glucose, UA: NEGATIVE mg/dL
Hgb urine dipstick: NEGATIVE
Ketones, ur: NEGATIVE mg/dL
Nitrite: NEGATIVE
Protein, ur: 30 mg/dL — AB
Specific Gravity, Urine: 1.011 (ref 1.005–1.030)
WBC, UA: >50 WBC/hpf — ABNORMAL HIGH (ref 0–5)
pH: 5 (ref 5.0–8.0)

## 2020-11-03 LAB — CBG MONITORING, ED: Glucose-Capillary: 117 mg/dL — ABNORMAL HIGH (ref 70–99)

## 2020-11-03 LAB — COMPREHENSIVE METABOLIC PANEL
ALT: 33 U/L (ref 0–44)
AST: 44 U/L — ABNORMAL HIGH (ref 15–41)
Albumin: 3.4 g/dL — ABNORMAL LOW (ref 3.5–5.0)
Alkaline Phosphatase: 45 U/L (ref 38–126)
Anion gap: 6 (ref 5–15)
BUN: 18 mg/dL (ref 8–23)
CO2: 21 mmol/L — ABNORMAL LOW (ref 22–32)
Calcium: 9.2 mg/dL (ref 8.9–10.3)
Chloride: 113 mmol/L — ABNORMAL HIGH (ref 98–111)
Creatinine, Ser: 1.8 mg/dL — ABNORMAL HIGH (ref 0.44–1.00)
GFR, Estimated: 31 mL/min — ABNORMAL LOW (ref >60)
Glucose, Bld: 98 mg/dL (ref 70–99)
Potassium: 4 mmol/L (ref 3.5–5.1)
Sodium: 140 mmol/L (ref 135–145)
Total Bilirubin: 1.3 mg/dL — ABNORMAL HIGH (ref 0.3–1.2)
Total Protein: 6.9 g/dL (ref 6.5–8.1)

## 2020-11-03 IMAGING — CR DG CHEST 2V
2 series · 2 of 2 positions shown · non-contrast
Comparison: PA and lateral chest [DATE].  CT chest [DATE].

CLINICAL DATA: Left-sided weakness for 3 days.

EXAM:
CHEST - 2 VIEW

[chest pa]
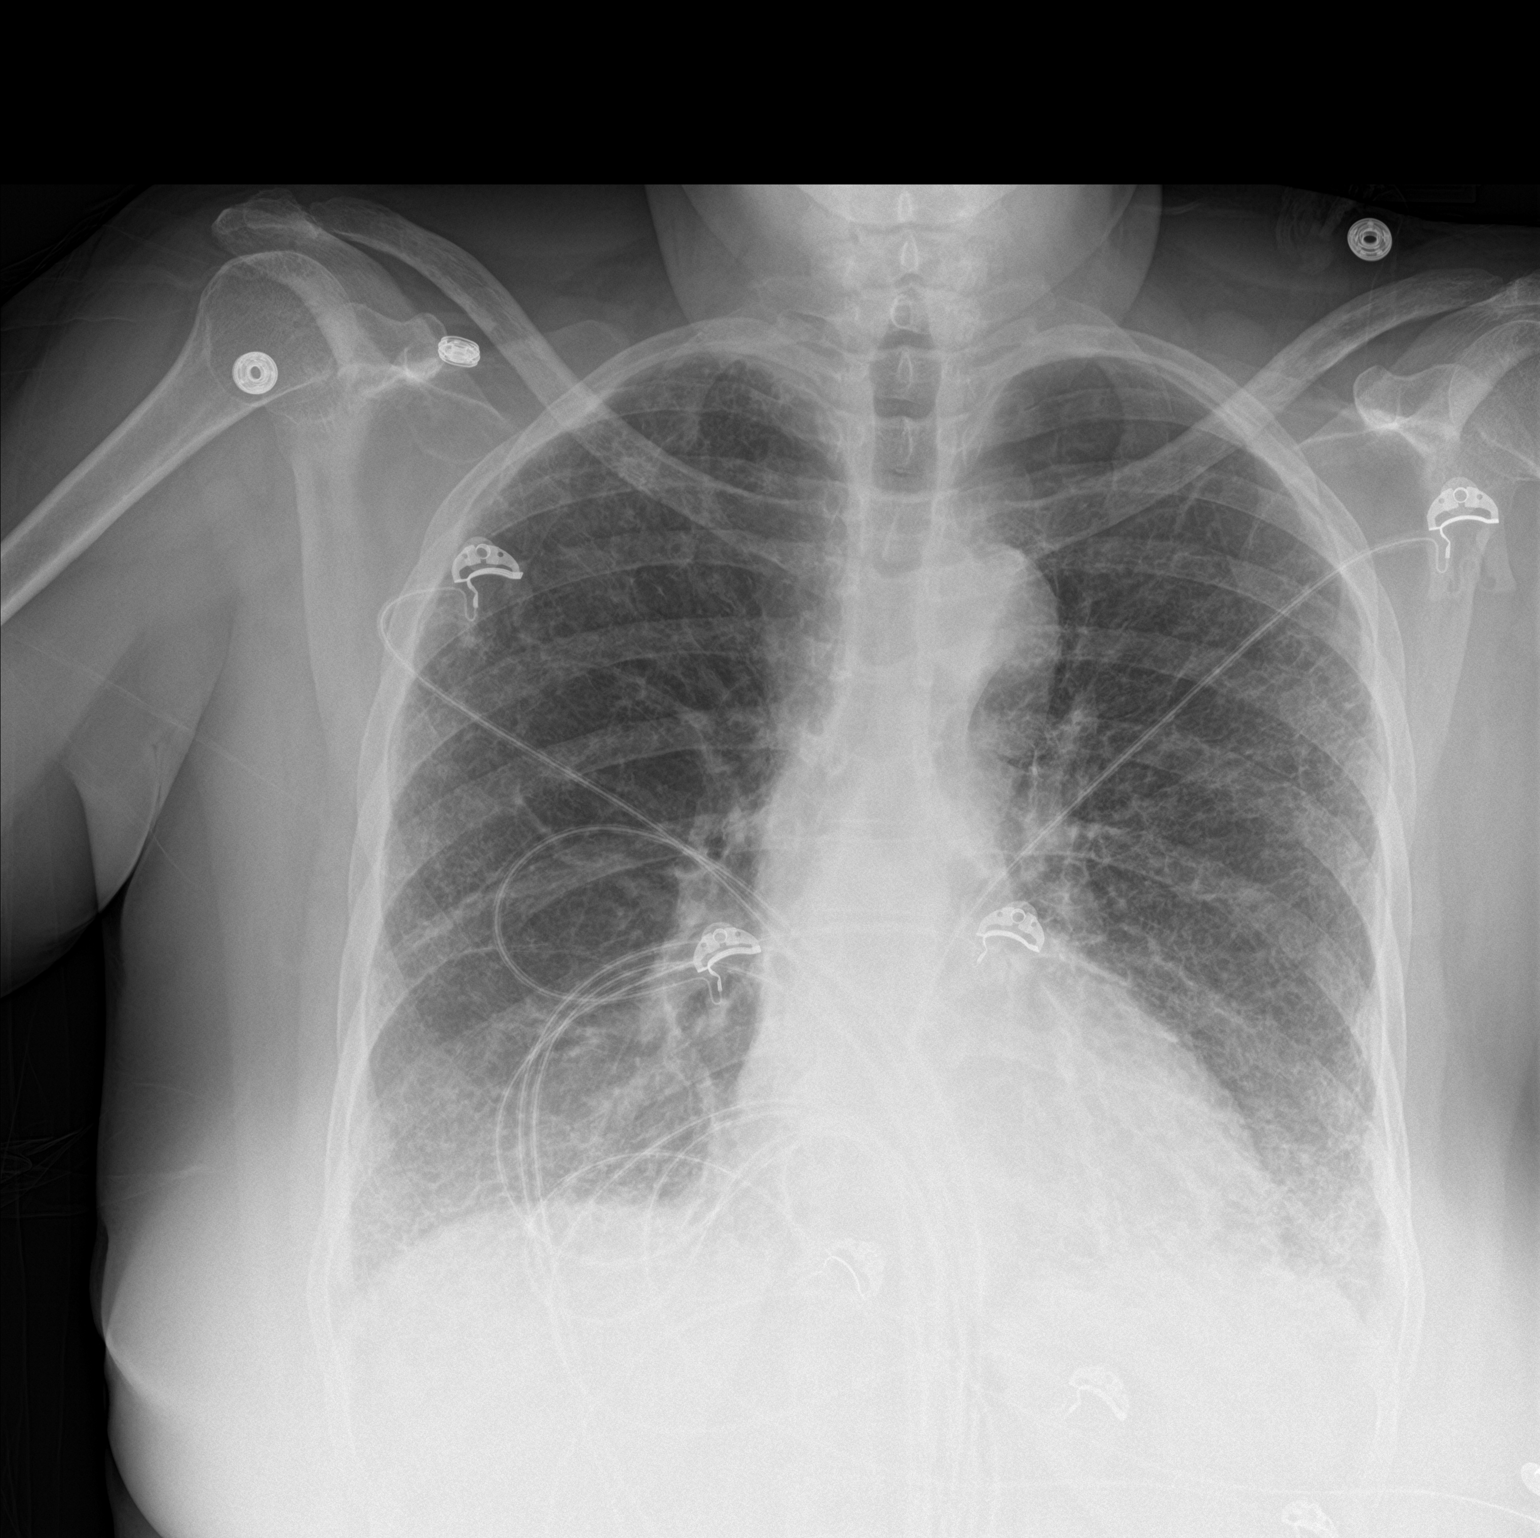

[chest lat]
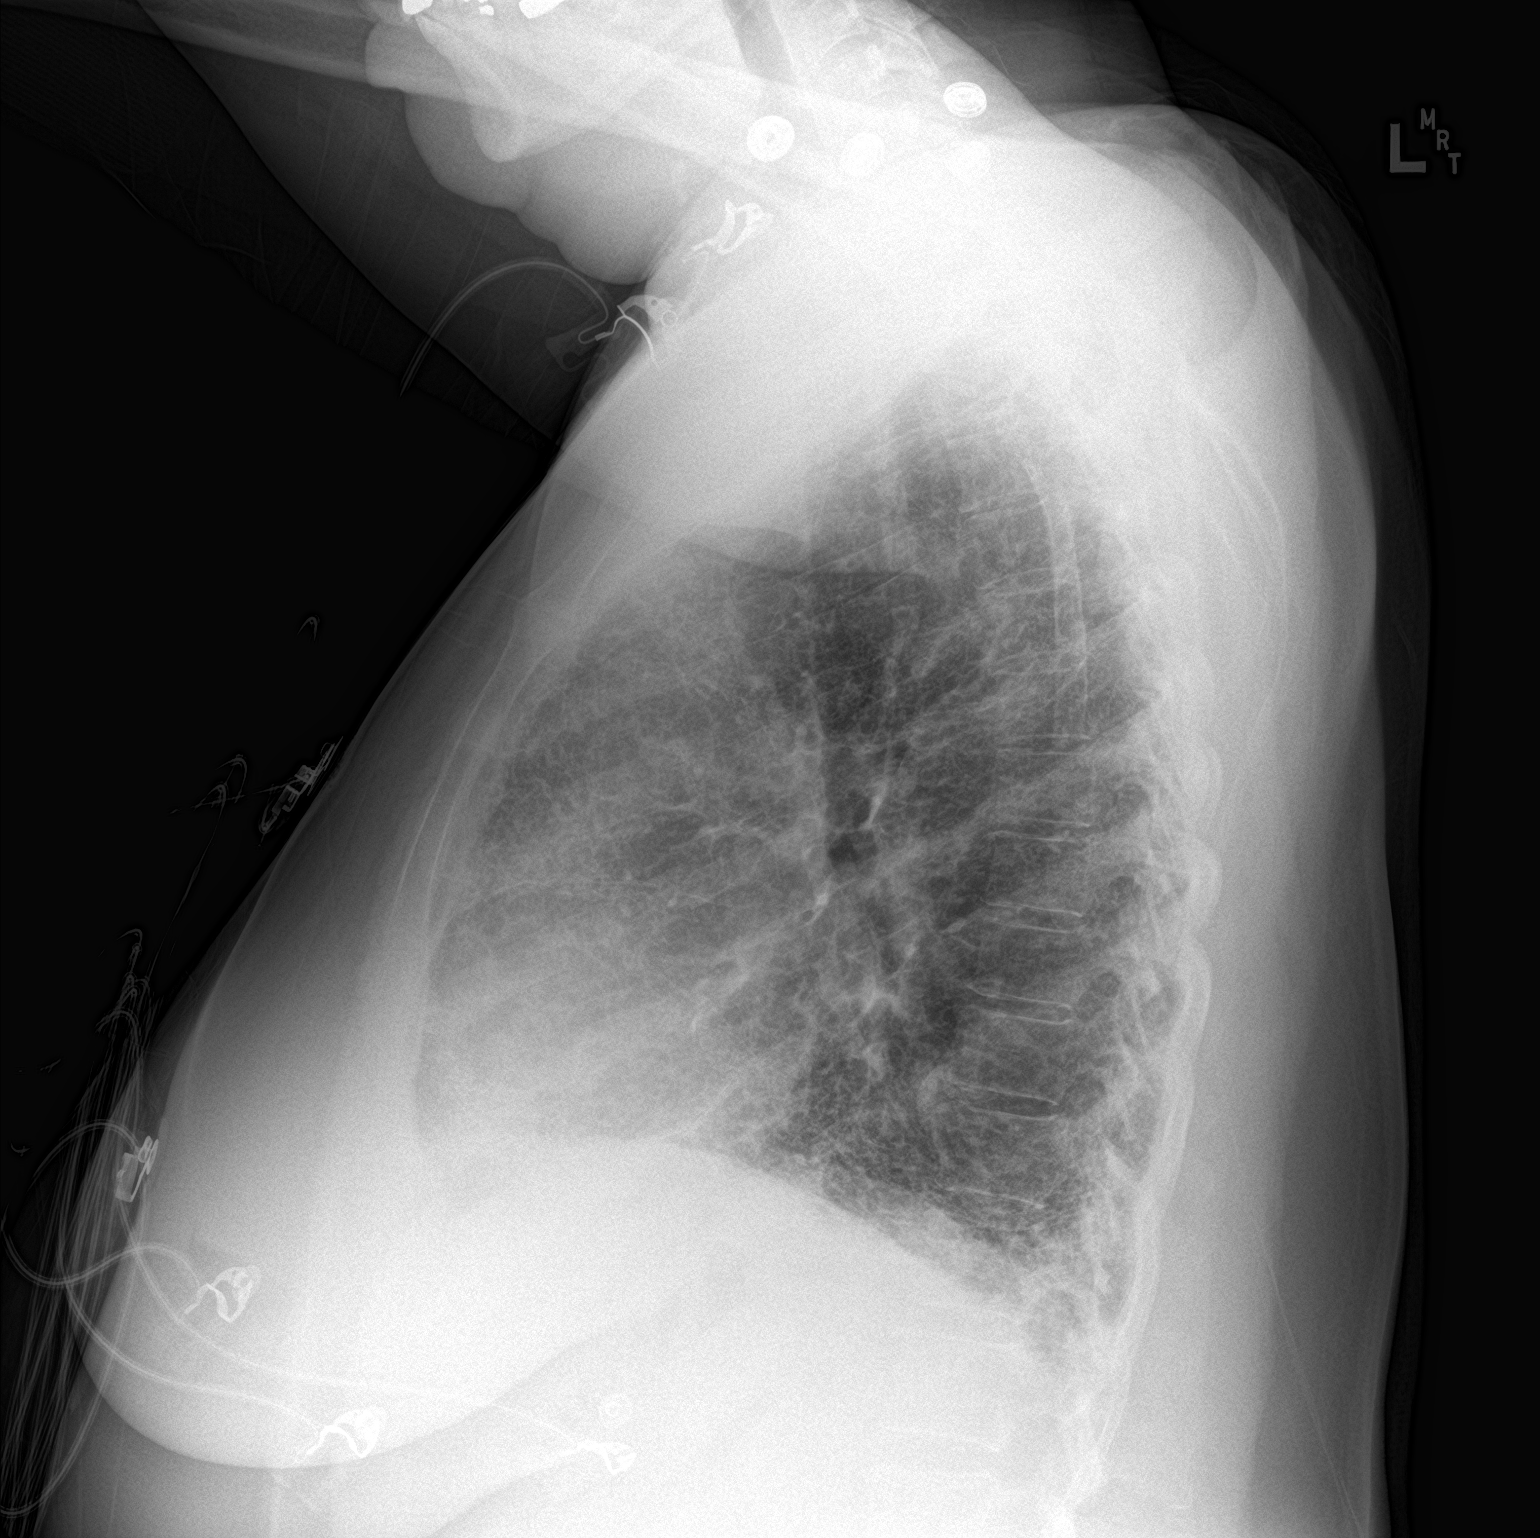

[2 of 2 positions shown; findings below may reference images not displayed]

FINDINGS: Extensive pulmonary fibrosis is identified as seen on the prior
exams. No consolidative process, pneumothorax or effusion. Heart
size is normal. No acute or focal bony abnormality.
IMPRESSION: No acute disease.

Pulmonary fibrosis.

## 2020-11-03 IMAGING — MR MR MRA HEAD W/O CM
1 series · 19 of 48 positions shown · non-contrast
Comparison: Previous MRI from [DATE] and MRA from [DATE].

CLINICAL DATA: Follow-up examination for acute stroke.

EXAM:
MRA HEAD WITHOUT CONTRAST
TECHNIQUE: Angiographic images of the Circle of Willis were acquired using MRA
technique without intravenous contrast.

[Series 5: 3d cow · axial · 0.5mm · 0.41mm/px · z∈[-59,+16]mm · 19 of 172 slices shown]
[im 1/172]
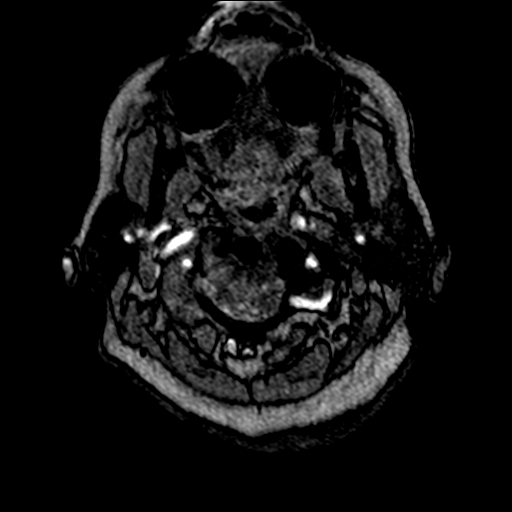
[im 4/172]
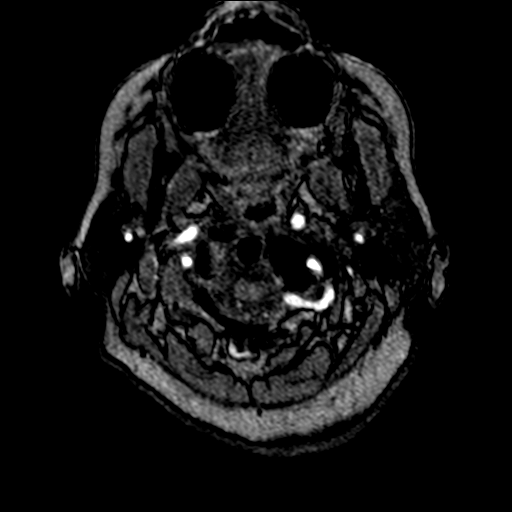
[im 8/172]
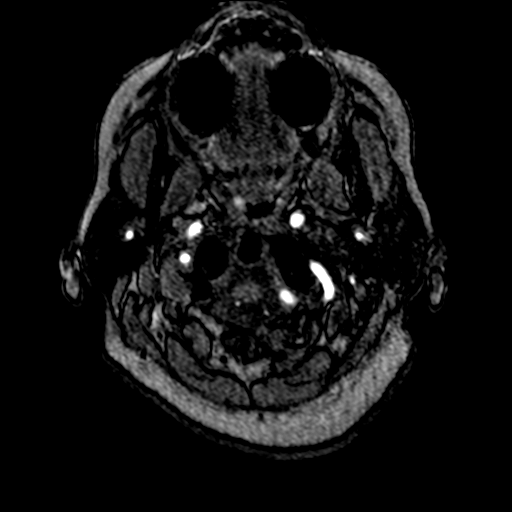
[im 11/172]
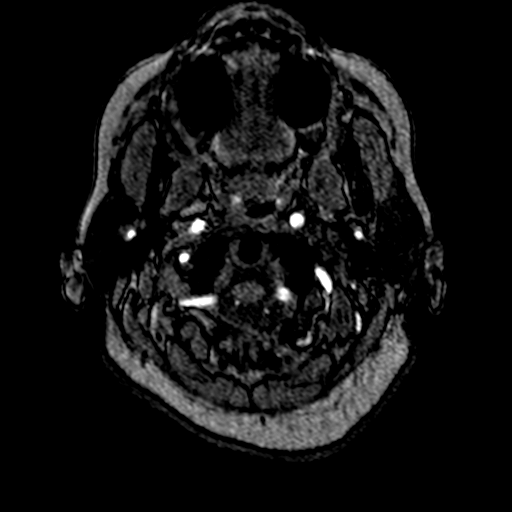
[im 15/172]
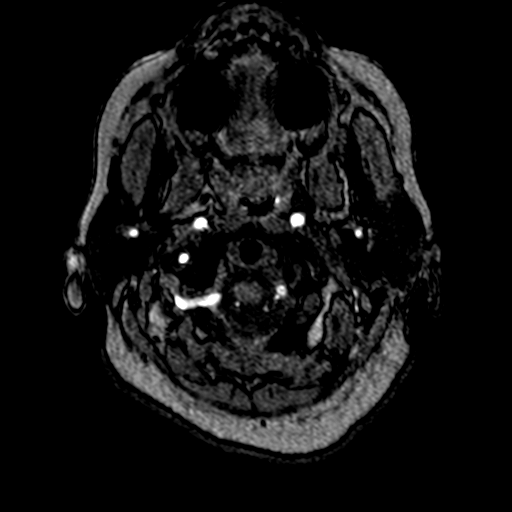
[im 19/172]
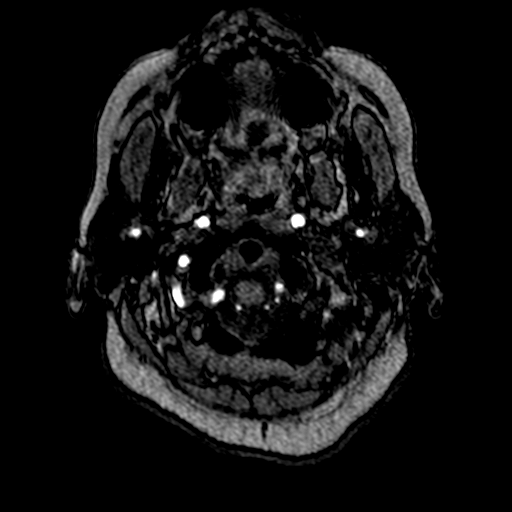
[im 22/172]
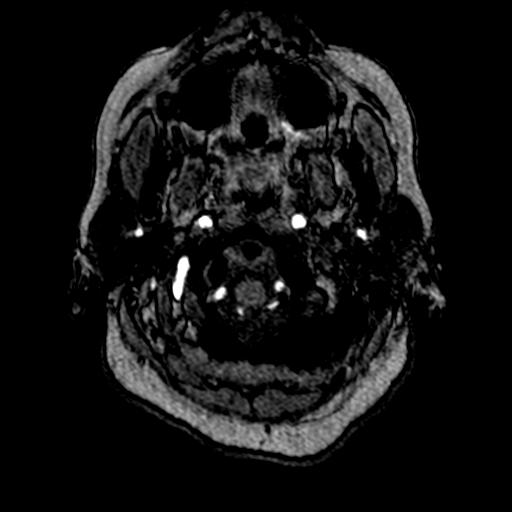
[im 26/172]
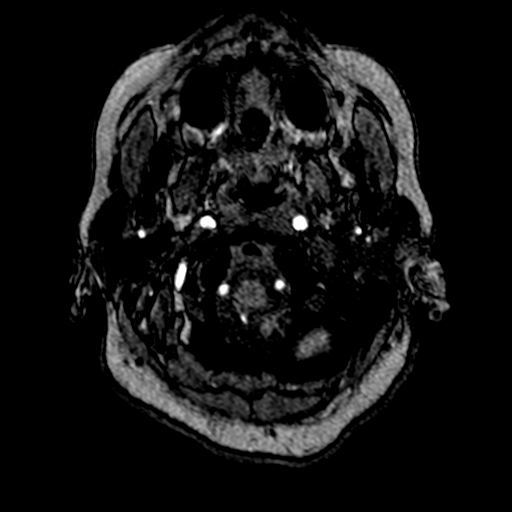
[im 30/172]
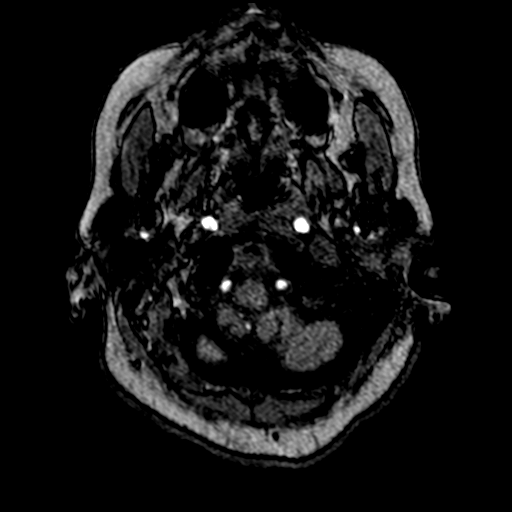
[im 33/172]
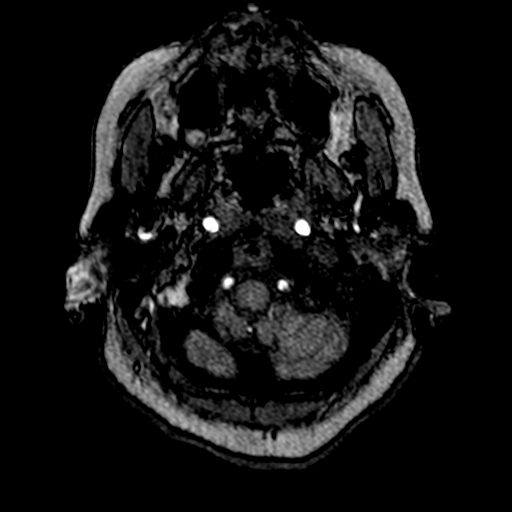
[im 37/172]
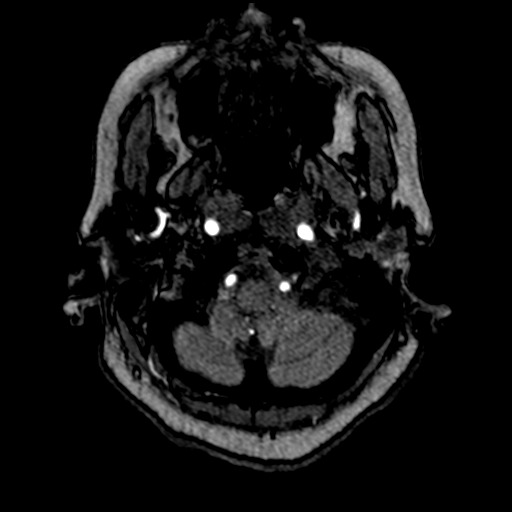
[im 55/172]
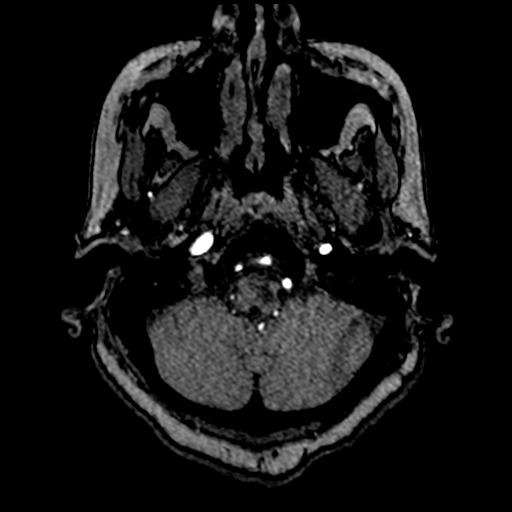
[im 77/172]
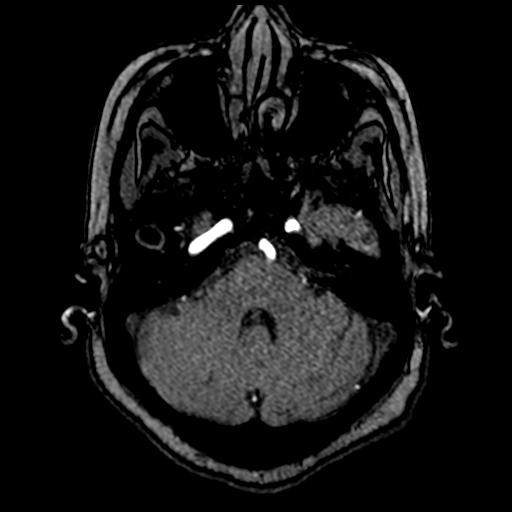
[im 88/172]
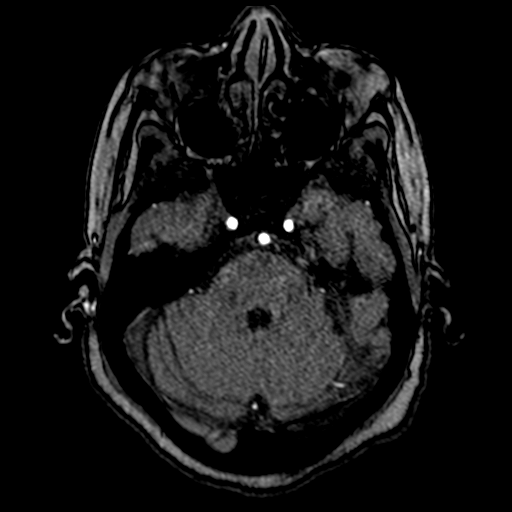
[im 99/172]
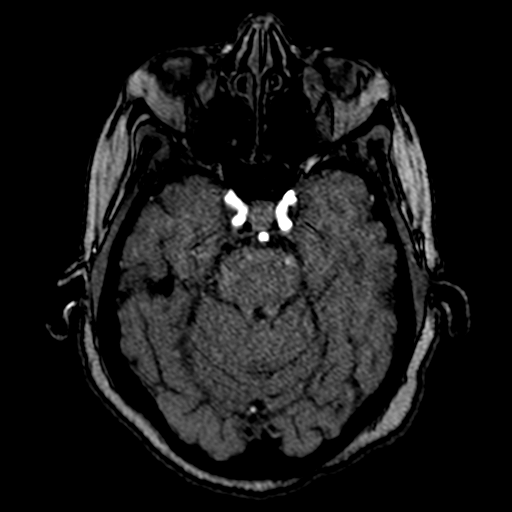
[im 121/172]
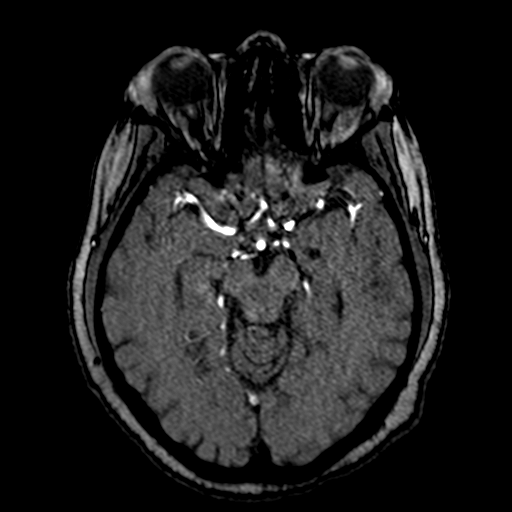
[im 142/172]
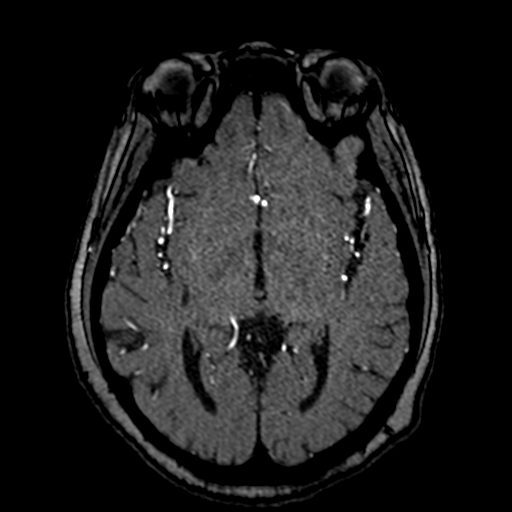
[im 146/172]
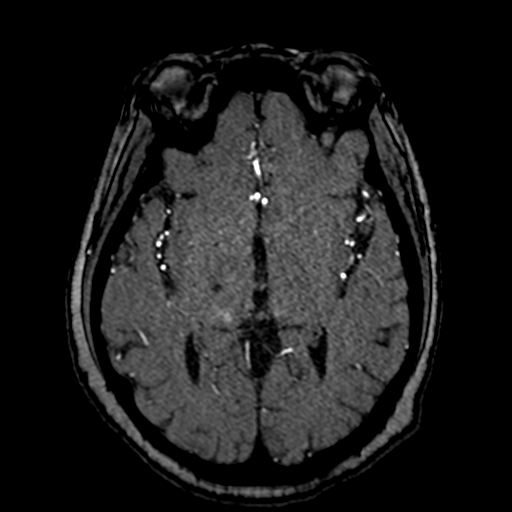
[im 164/172]
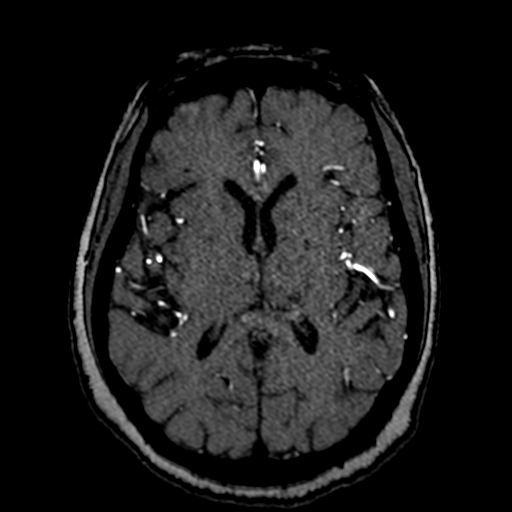

[19 of 48 positions shown; findings below may reference images not displayed]

FINDINGS: ANTERIOR CIRCULATION:

Visualized distal cervical segments of the internal carotid arteries
are patent with antegrade flow. Petrous segments widely patent
bilaterally. Mild atheromatous irregularity throughout the carotid
siphons without hemodynamically significant stenosis. A1 segments
patent bilaterally. Normal anterior communicating artery complex.
Anterior cerebral arteries patent to their distal aspects without
stenosis. No M1 stenosis or occlusion. Normal MCA bifurcations.
Distal MCA branches remain well perfused and symmetric.

POSTERIOR CIRCULATION:

Both vertebral arteries widely patent to the vertebrobasilar
junction without stenosis. Both PICA origins patent and normal.
Short-segment fenestration at the proximal basilar artery again
noted. Basilar widely patent distally without stenosis. Superior
cerebellar arteries patent bilaterally. Both PCAs primarily supplied
via the basilar. Mild atheromatous irregularity throughout the PCAs
bilaterally without large vessel occlusion. Short-segment moderate
left P2 stenosis, mildly progressed from previous (series [1A],
image 8).

No intracranial aneurysm.
IMPRESSION: 1. Negative MRA for large vessel occlusion.
2. Atheromatous irregularity involving the PCAs bilaterally with
superimposed moderate left P2 stenosis, mildly progressed from
previous.
3. No other hemodynamically significant or correctable stenosis
about the major intracranial arterial vasculature.

## 2020-11-03 IMAGING — MR MR CERVICAL SPINE W/O CM
5 series · 34 of 48 positions shown · non-contrast
Comparison: Prior MRI from [DATE].

CLINICAL DATA: Initial evaluation for spinal stenosis.

EXAM:
MRI CERVICAL SPINE WITHOUT CONTRAST
TECHNIQUE: Multiplanar, multisequence MR imaging of the cervical spine was
performed. No intravenous contrast was administered.

[Series 5: T2 · sagittal · 3.0mm · 0.69mm/px · 6 of 15 slices shown (1 of 2)]
[im 1/15]
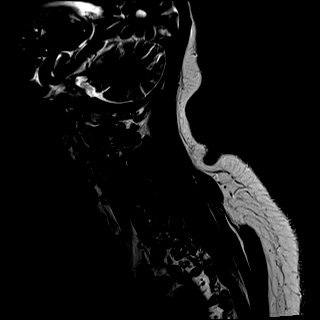
[im 3/15]
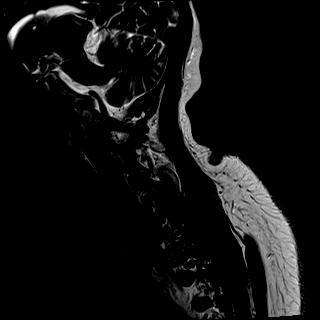
[im 6/15]
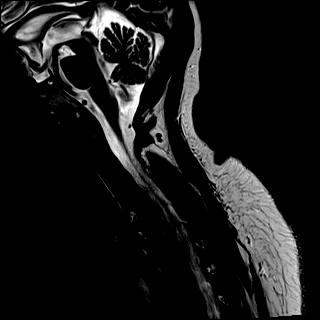
[im 9/15]
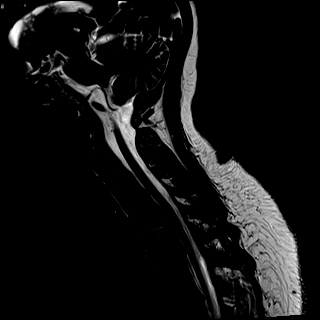
[im 12/15]
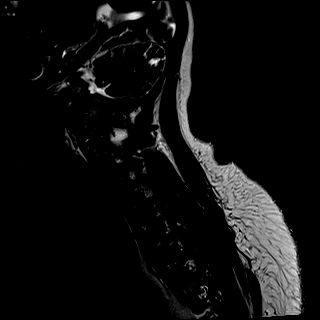
[im 15/15]
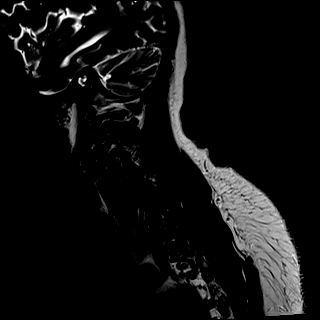

[Series 6: T1 · sagittal · 3.0mm · 0.69mm/px · 6 of 15 slices shown]
[im 1/15]
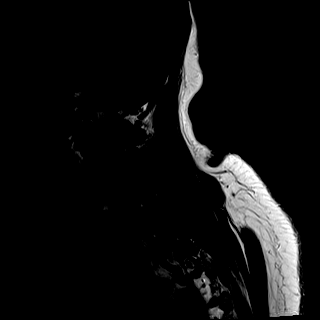
[im 3/15]
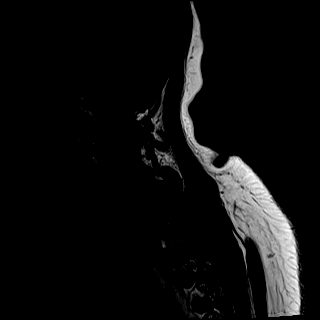
[im 6/15]
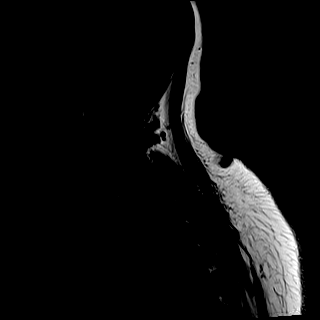
[im 9/15]
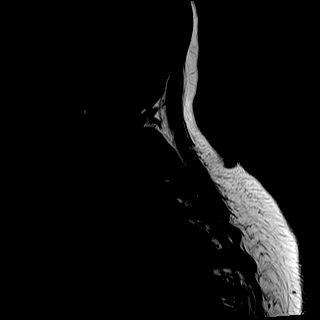
[im 12/15]
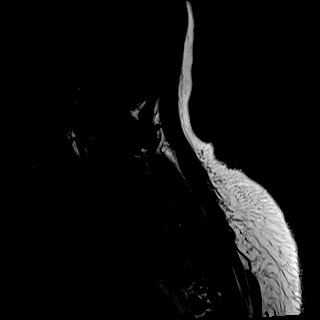
[im 15/15]
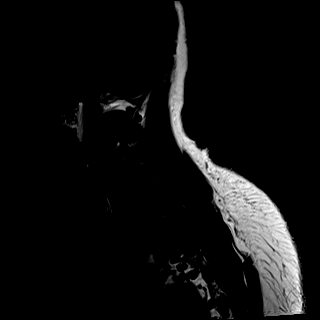

[Series 7: STIR · sagittal · 3.0mm · 0.86mm/px · 6 of 15 slices shown]
[im 1/15]
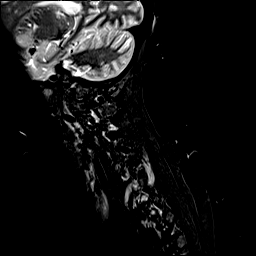
[im 3/15]
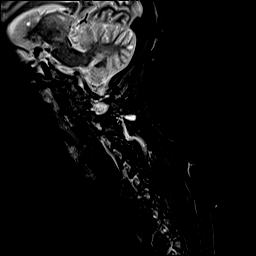
[im 6/15]
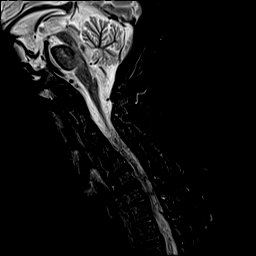
[im 9/15]
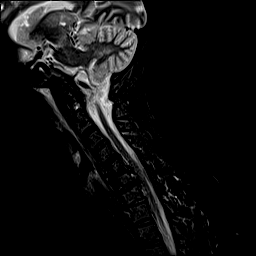
[im 12/15]
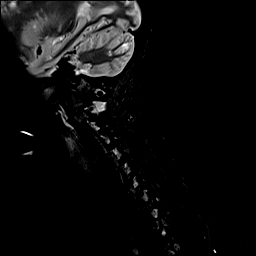
[im 15/15]
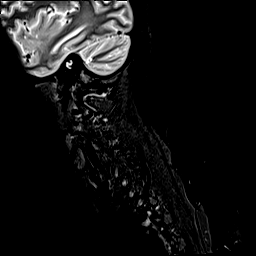

[Series 8: T2 · axial · 3.0mm · 0.66mm/px · z∈[-179,-59]mm · 9 of 40 slices shown (2 of 2)]
[im 1/40]
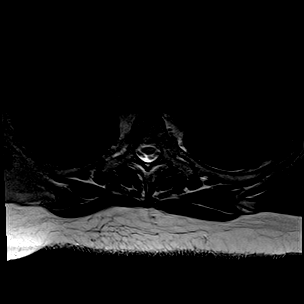
[im 6/40]
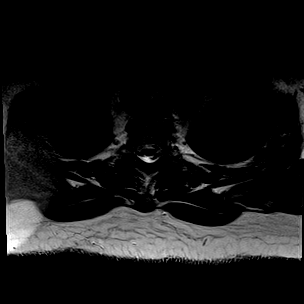
[im 12/40]
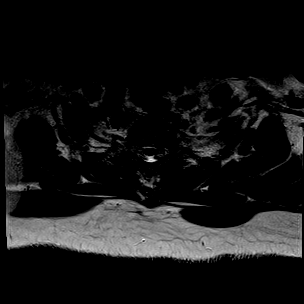
[im 17/40]
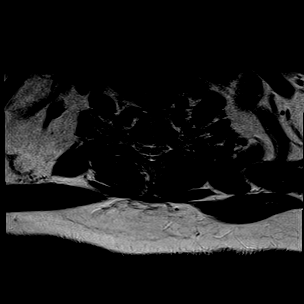
[im 20/40]
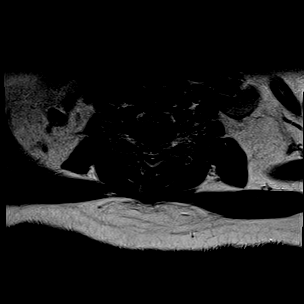
[im 23/40]
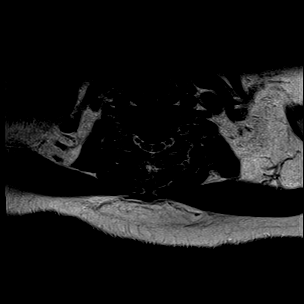
[im 28/40]
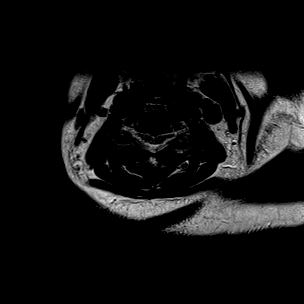
[im 34/40]
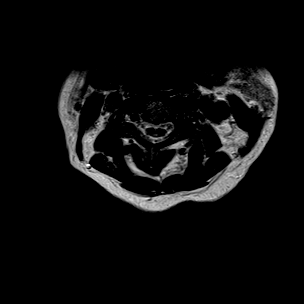
[im 40/40]
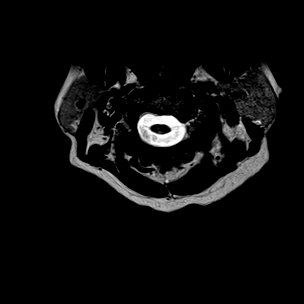

[Series 9: GRE · axial · 3.0mm · 0.39mm/px · z∈[-179,-77]mm · 7 of 40 slices shown]
[im 1/40]
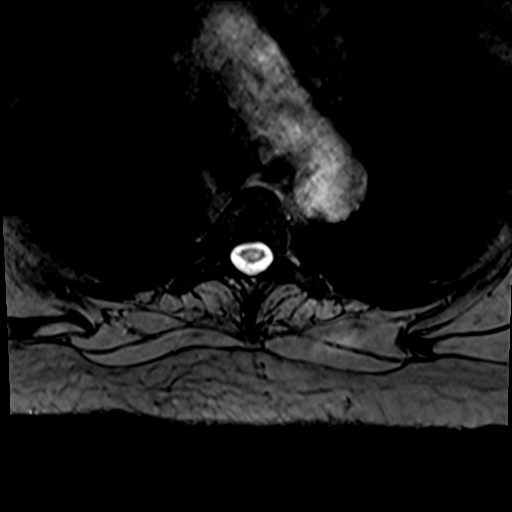
[im 6/40]
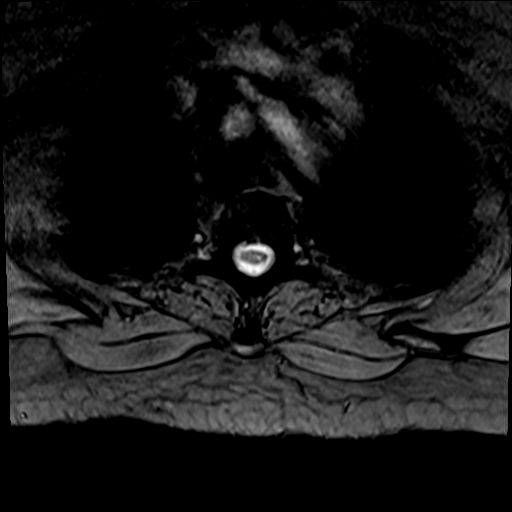
[im 12/40]
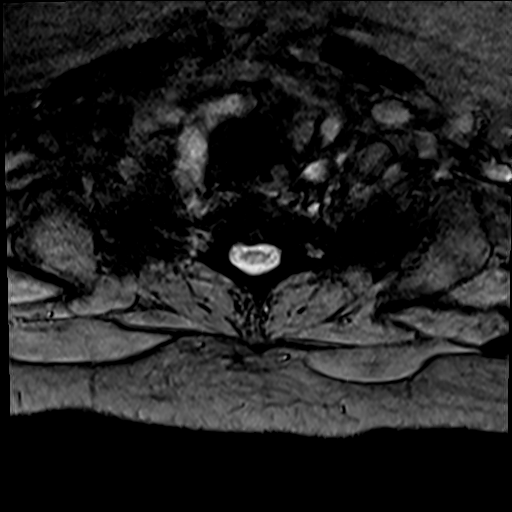
[im 17/40]
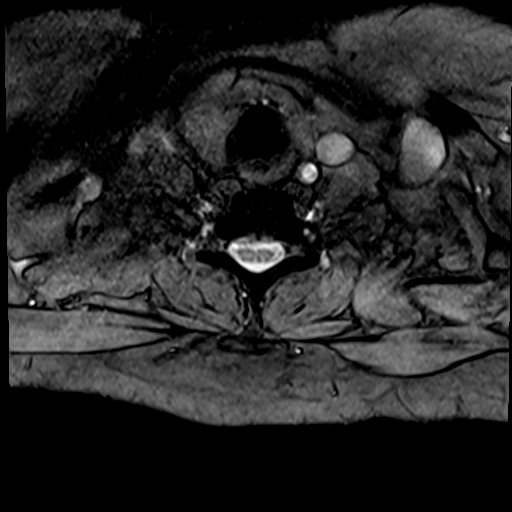
[im 23/40]
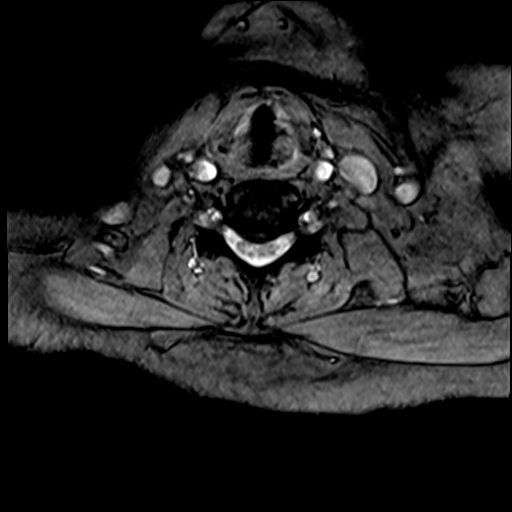
[im 28/40]
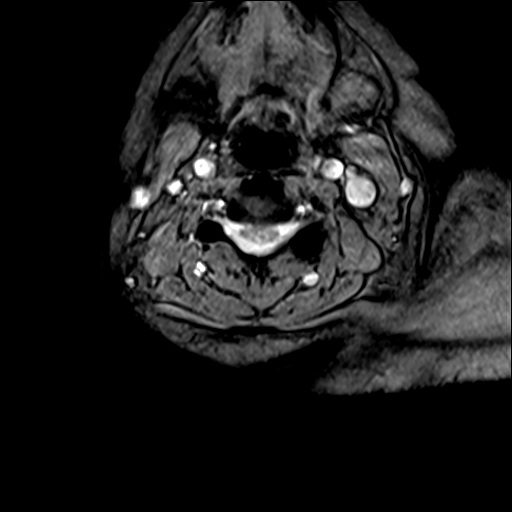
[im 34/40]
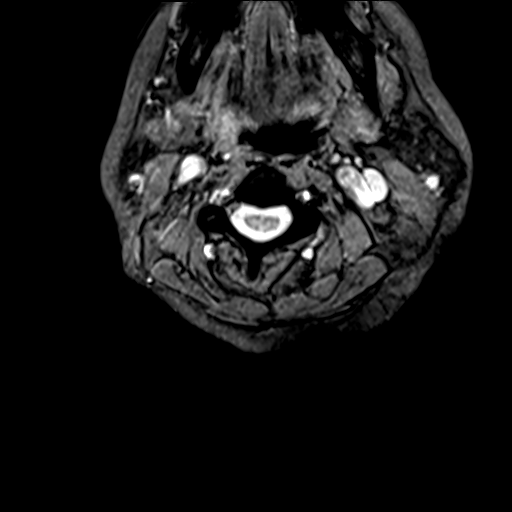

[34 of 48 positions shown; findings below may reference images not displayed]

FINDINGS: Alignment: Smooth reversal of the normal cervical lordosis with
trace stepwise anterolisthesis of C2 on C3 through C4 on C5, stable
from previous.

Vertebrae: Vertebral body height maintained without acute or
interval fracture. Bone marrow signal intensity within normal
limits. No discrete or worrisome osseous lesions. Discogenic
reactive endplate change noted about the C6-7 interspace. No
abnormal marrow edema.

Cord: Again seen is patchy signal abnormality involving the
central/dorsal aspect of the cervical spinal cord, extending from
the level of C2 through C7-T1. Signal change measures up to 3 mm in
maximal diameter at the level of C2-3. Overall appearance is
relatively unchanged from prior. While this finding could reflect
hydro syringomyelia as previously postulated, possible long segment
chronic myelomalacia related to a previous insult could also be
considered. The underlying cord is again noted to be diffusely
atrophic, also stable.

Posterior Fossa, vertebral arteries, paraspinal tissues: Atrophy
with chronic microvascular ischemic changes noted within the
visualized brain. Craniocervical junction within normal limits.
Paraspinous and prevertebral soft tissues within normal limits.
Normal flow voids seen within the vertebral arteries bilaterally.

Disc levels:

C2-C3: Trace anterolisthesis. Tiny central disc protrusion minimally
indents the ventral thecal sac. No spinal stenosis. Foramina are
widely patent.

C3-C4: Trace anterolisthesis. Shallow central disc protrusion
minimally indents the ventral thecal sac. Left-sided facet
degeneration. No spinal stenosis. Foramina remain widely patent.

C4-C5: Trace anterolisthesis. Small central disc protrusion indents
the ventral thecal sac. Mild flattening of the ventral cord without
significant spinal stenosis. Foramina remain widely patent.

C5-C6: Degenerative intervertebral disc space narrowing. Broad-based
central disc osteophyte complex indents the ventral thecal sac,
asymmetric to the left. Mild flattening of the ventral cord with
resultant mild spinal stenosis. Foramina remain patent. Appearance
is stable.

C6-C7: Degenerative intervertebral disc space narrowing. Broad-based
central disc osteophyte complex indents the ventral thecal sac,
contacting and flattening the ventral spinal cord. Mild spinal
stenosis. Foramina remain patent. Appearance is stable.

C7-T1: Right paracentral disc extrusion with both superior and
inferior migration, slightly decreased in size and regressed as
compared to previous. Disc material continues to contact the right
ventral cord with mild cord flattening (series 9, image 27).
Persistent mild spinal stenosis. Superimposed mild facet and
ligament flavum hypertrophy. Foramina remain patent.

T1-2: Normal interspace. Mild left greater than right facet
hypertrophy. No stenosis.

T2-3: Small right paracentral disc extrusion mildly indents the
right ventral thecal sac. Slight superior migration of disc
material. No significant spinal stenosis. Foramina remain patent.
IMPRESSION: 1. Persistent patchy signal abnormality involving the central/dorsal
aspect of the cervical spinal cord, extending from C2 through C7-T1,
relatively stable and unchanged from previous. While this finding
could reflect hydrosyringomyelia as previously described, possible
long segment chronic myelomalacia related to a previous insult could
also be considered (i.e. previous myelitis). The underlying cord is
diffusely atrophic, also stable.
2. Slight interval regression of right paracentral disc extrusion at
C7-T1, with persistent mild spinal stenosis and right-sided cord
flattening at this level.
3. Otherwise stable multilevel cervical spondylosis with resultant
mild spinal stenosis at C5-6 and C6-7.

## 2020-11-03 IMAGING — MR MR HEAD W/O CM
7 of 11 series · 25 of 48 positions shown · non-contrast
Comparison: [DATE]

CLINICAL DATA: Worsening of left arm and leg numbness. Acute
infarction in [REDACTED] of this year affecting the right thalamus.

EXAM:
MRI HEAD WITHOUT CONTRAST
TECHNIQUE: Multiplanar, multiecho pulse sequences of the brain and surrounding
structures were obtained without intravenous contrast.

[Series 3: DWI · axial · 3.0mm · 0.94mm/px · z∈[-73,+73]mm · 8 of 106 slices shown (1 of 2)]
[im 1/106]
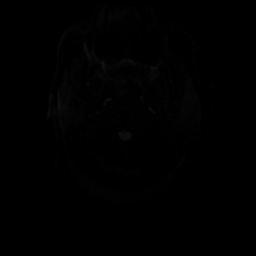
[im 16/106]
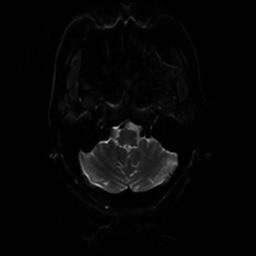
[im 31/106]
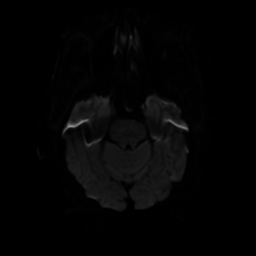
[im 46/106]
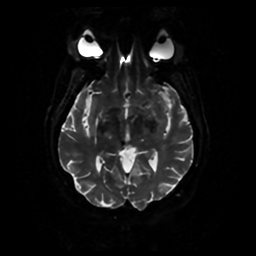
[im 61/106]
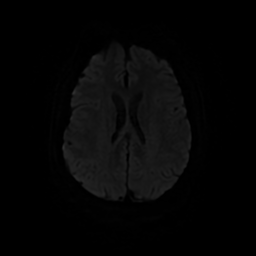
[im 76/106]
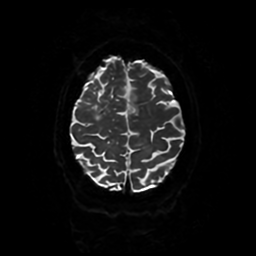
[im 91/106]
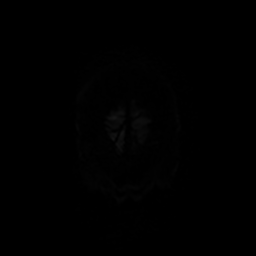
[im 106/106]
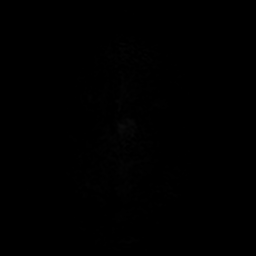

[Series 4: DWI · coronal · 4.0mm · 0.94mm/px · 5 of 72 slices shown (2 of 2)]
[im 1/72]
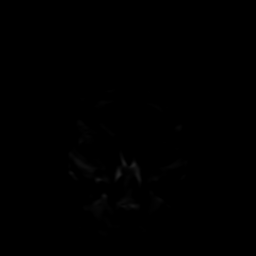
[im 18/72]
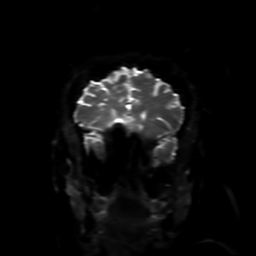
[im 36/72]
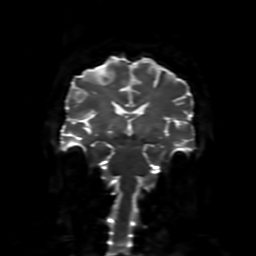
[im 54/72]
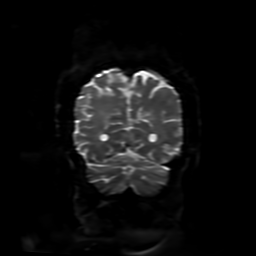
[im 72/72]
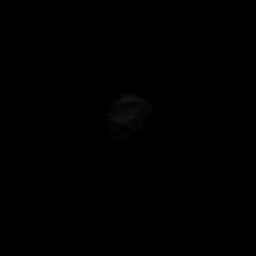

[Series 5: FLAIR · sagittal · 5.0mm · 0.23mm/px · 2 of 26 slices shown (1 of 2)]
[im 1/26]
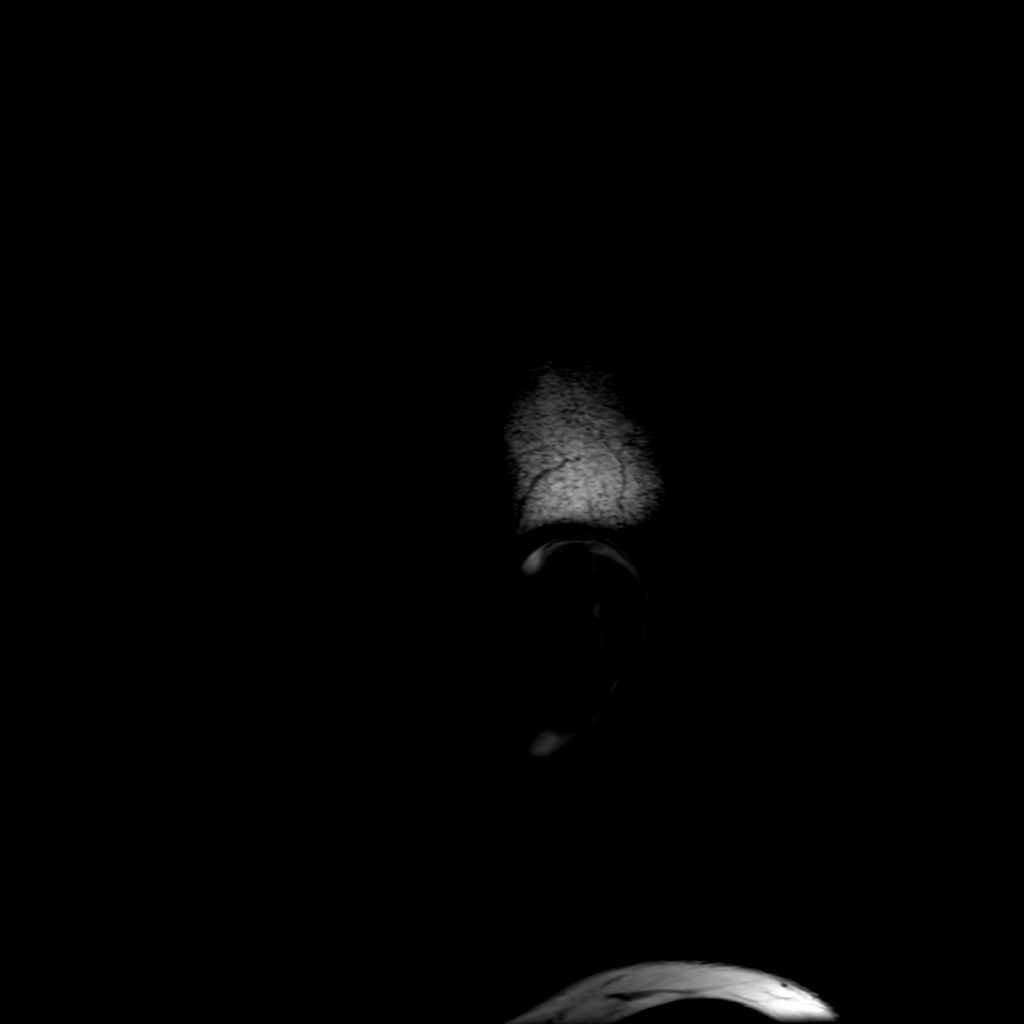
[im 26/26]
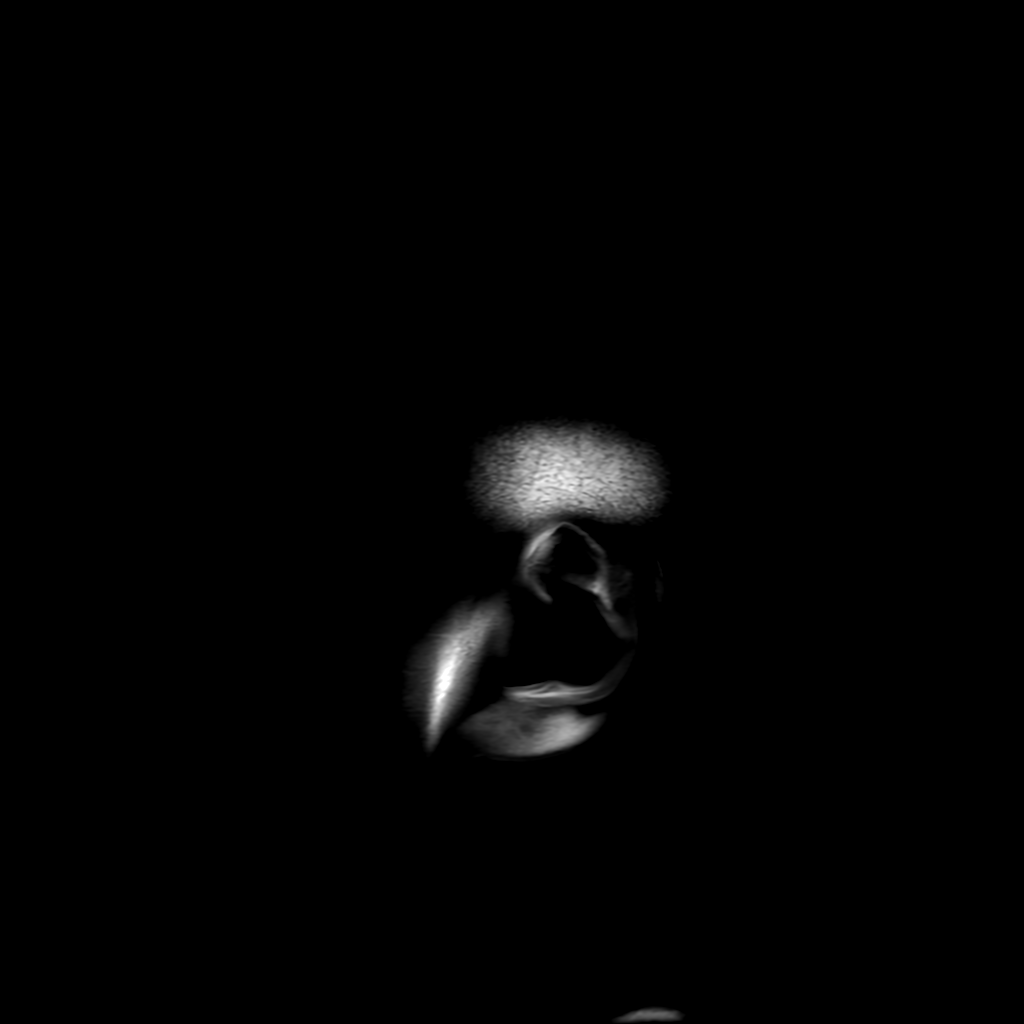

[Series 6: T2 · axial · 5.0mm · 0.23mm/px · 1 of 27 slices shown]
[im 1/27]
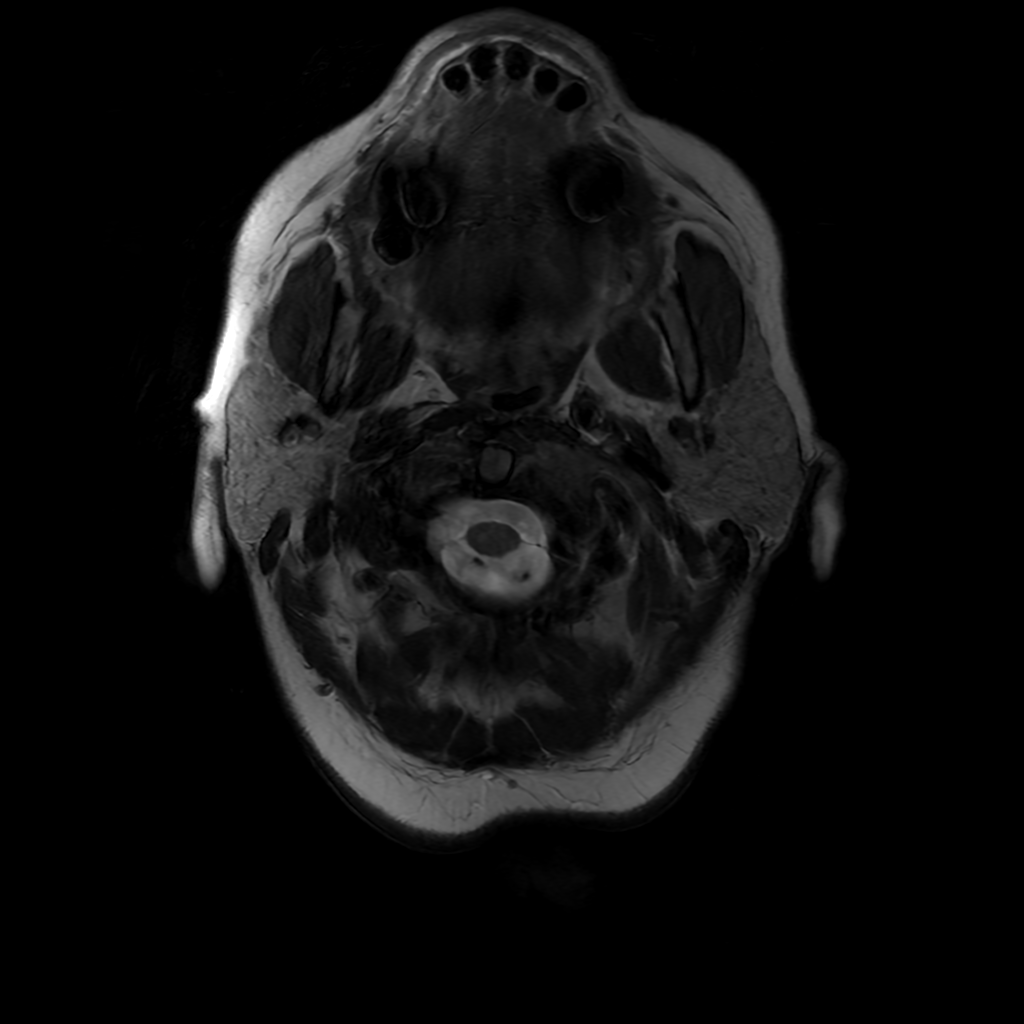

[Series 7: FLAIR · axial · 3.0mm · 0.45mm/px · z∈[-70,+79]mm · 2 of 27 slices shown (2 of 2)]
[im 1/27]
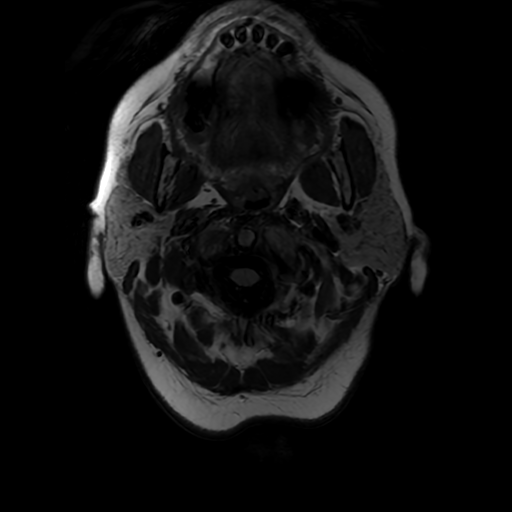
[im 27/27]
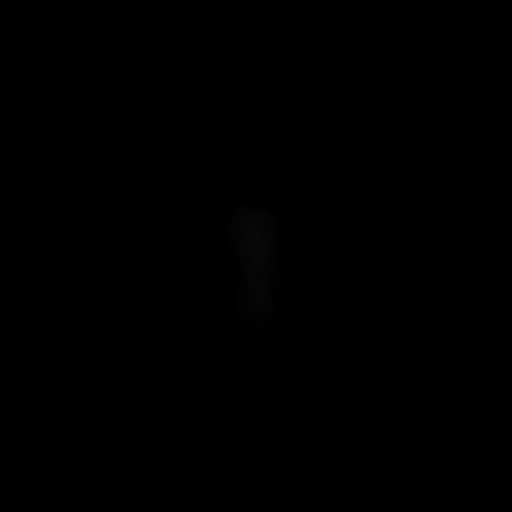

[Series 350: ADC · axial · 3.0mm · 0.94mm/px · z∈[-73,+73]mm · 4 of 53 slices shown (1 of 2)]
[im 1/53]
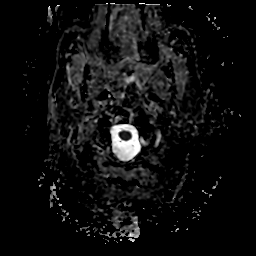
[im 18/53]
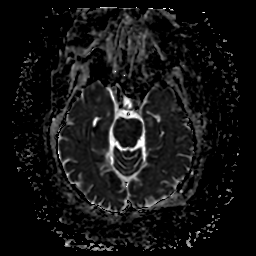
[im 35/53]
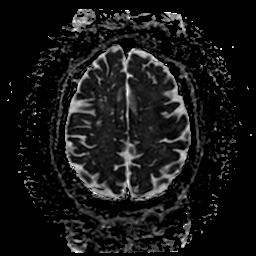
[im 53/53]
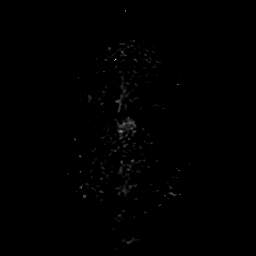

[Series 450: ADC · coronal · 4.0mm · 0.94mm/px · 3 of 37 slices shown (2 of 2)]
[im 1/37]
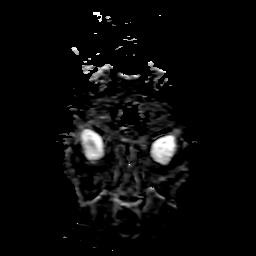
[im 19/37]
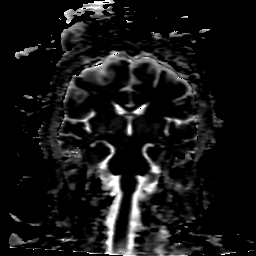
[im 37/37]
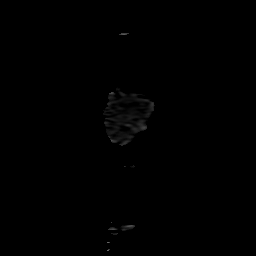

[25 of 48 positions shown; findings below may reference images not displayed]

FINDINGS: Brain: New punctate acute infarction at the right medial
parietooccipital junction region. No other acute finding. Chronic
small-vessel ischemic changes affect the pons. Few old small vessel
cerebellar infarctions. Old small vessel infarctions in the right
thalamus, the left thalamus, the basal ganglia and cerebral
hemispheric white matter. Scattered old cortical and subcortical
infarctions in both occipital lobes and both frontoparietal regions.
No mass, hemorrhage, hydrocephalus or extra-axial collection.

Vascular: Major vessels at the base of the brain show flow.

Skull and upper cervical spine: Negative

Sinuses/Orbits: Clear/normal

Other: None
IMPRESSION: Newly seen punctate acute infarction at the medial right
parietooccipital junction. No other change since the most recent
exam [DATE].

Chronic small-vessel ischemic changes of the pons, cerebellum,
thalami, basal ganglia and hemispheric white matter. Old cortical
and subcortical infarctions in the occipital regions and
frontoparietal regions.

## 2020-11-03 MED ORDER — SENNOSIDES-DOCUSATE SODIUM 8.6-50 MG PO TABS: 1.0000 | ORAL_TABLET | Freq: Every evening | ORAL | Status: DC | PRN

## 2020-11-03 MED ORDER — ROSUVASTATIN CALCIUM 20 MG PO TABS
20.0000 mg | ORAL_TABLET | Freq: Every day | ORAL | Status: DC
  Administered 2020-11-04 – 2020-11-06 (×3): 20 mg via ORAL
  Filled 2020-11-03 (×3): qty 1

## 2020-11-03 MED ORDER — INSULIN ASPART 100 UNIT/ML IJ SOLN: 0.0000 [IU] | Freq: Every day | INTRAMUSCULAR | Status: DC

## 2020-11-03 MED ORDER — HEPARIN SODIUM (PORCINE) 5000 UNIT/ML IJ SOLN
5000.0000 [IU] | Freq: Three times a day (TID) | INTRAMUSCULAR | Status: DC
  Administered 2020-11-04 – 2020-11-06 (×8): 5000 [IU] via SUBCUTANEOUS
  Filled 2020-11-03 (×8): qty 1

## 2020-11-03 MED ORDER — ACETAMINOPHEN 160 MG/5ML PO SOLN
650.0000 mg | ORAL | Status: DC | PRN
Start: 2020-11-03 — End: 2020-11-06

## 2020-11-03 MED ORDER — UMECLIDINIUM BROMIDE 62.5 MCG/INH IN AEPB
1.0000 | INHALATION_SPRAY | Freq: Every day | RESPIRATORY_TRACT | Status: DC
  Administered 2020-11-05 – 2020-11-06 (×2): 1 via RESPIRATORY_TRACT
  Filled 2020-11-03: qty 7

## 2020-11-03 MED ORDER — TRAZODONE HCL 50 MG PO TABS
50.0000 mg | ORAL_TABLET | Freq: Every day | ORAL | Status: DC
  Administered 2020-11-04 – 2020-11-05 (×3): 50 mg via ORAL
  Filled 2020-11-03 (×3): qty 1

## 2020-11-03 MED ORDER — VENLAFAXINE HCL ER 75 MG PO CP24
75.0000 mg | ORAL_CAPSULE | Freq: Every day | ORAL | Status: DC
  Administered 2020-11-04 – 2020-11-06 (×3): 75 mg via ORAL
  Filled 2020-11-03 (×3): qty 1

## 2020-11-03 MED ORDER — ALBUTEROL SULFATE HFA 108 (90 BASE) MCG/ACT IN AERS
2.0000 | INHALATION_SPRAY | RESPIRATORY_TRACT | Status: DC | PRN
  Filled 2020-11-03: qty 6.7

## 2020-11-03 MED ORDER — PIRFENIDONE 267 MG PO TABS
3.0000 | ORAL_TABLET | Freq: Three times a day (TID) | ORAL | Status: DC
  Administered 2020-11-04 (×2): 801 mg via ORAL
  Filled 2020-11-03 (×4): qty 3

## 2020-11-03 MED ORDER — GABAPENTIN 100 MG PO CAPS: 200.0000 mg | ORAL_CAPSULE | Freq: Two times a day (BID) | ORAL | Status: DC | PRN

## 2020-11-03 MED ORDER — DICLOFENAC SODIUM 1 % EX GEL
2.0000 g | Freq: Every day | CUTANEOUS | Status: DC | PRN
  Filled 2020-11-03: qty 100

## 2020-11-03 MED ORDER — INSULIN ASPART 100 UNIT/ML IJ SOLN
0.0000 [IU] | Freq: Three times a day (TID) | INTRAMUSCULAR | Status: DC
  Administered 2020-11-05 (×2): 2 [IU] via SUBCUTANEOUS
  Administered 2020-11-06: 1 [IU] via SUBCUTANEOUS

## 2020-11-03 MED ORDER — SODIUM CHLORIDE 0.9 % IV SOLN: INTRAVENOUS | Status: AC

## 2020-11-03 MED ORDER — MOMETASONE FURO-FORMOTEROL FUM 200-5 MCG/ACT IN AERO
2.0000 | INHALATION_SPRAY | Freq: Two times a day (BID) | RESPIRATORY_TRACT | Status: DC
  Administered 2020-11-04 – 2020-11-06 (×4): 2 via RESPIRATORY_TRACT
  Filled 2020-11-03: qty 8.8

## 2020-11-03 MED ORDER — ASPIRIN EC 81 MG PO TBEC
81.0000 mg | DELAYED_RELEASE_TABLET | Freq: Every day | ORAL | Status: DC
  Administered 2020-11-04 – 2020-11-06 (×4): 81 mg via ORAL
  Filled 2020-11-03 (×4): qty 1

## 2020-11-03 MED ORDER — ACETAMINOPHEN 650 MG RE SUPP
650.0000 mg | RECTAL | Status: DC | PRN
Start: 2020-11-03 — End: 2020-11-06

## 2020-11-03 MED ORDER — ACETAMINOPHEN 325 MG PO TABS
650.0000 mg | ORAL_TABLET | ORAL | Status: DC | PRN
  Administered 2020-11-05 – 2020-11-06 (×2): 650 mg via ORAL
  Filled 2020-11-03 (×2): qty 2

## 2020-11-03 MED ORDER — PIRFENIDONE 267 MG PO TABS: 3.0000 | ORAL_TABLET | Freq: Three times a day (TID) | ORAL | Status: DC

## 2020-11-03 MED ORDER — BUSPIRONE HCL 10 MG PO TABS
10.0000 mg | ORAL_TABLET | Freq: Two times a day (BID) | ORAL | Status: DC
  Administered 2020-11-04 – 2020-11-06 (×5): 10 mg via ORAL
  Filled 2020-11-03 (×7): qty 1

## 2020-11-03 MED ORDER — STROKE: EARLY STAGES OF RECOVERY BOOK: Freq: Once | Status: DC

## 2020-11-03 MED ORDER — CLOPIDOGREL BISULFATE 75 MG PO TABS
75.0000 mg | ORAL_TABLET | Freq: Every day | ORAL | Status: DC
  Administered 2020-11-04 – 2020-11-06 (×4): 75 mg via ORAL
  Filled 2020-11-03 (×4): qty 1

## 2020-11-03 MED ORDER — FAMOTIDINE 20 MG PO TABS
20.0000 mg | ORAL_TABLET | Freq: Every day | ORAL | Status: DC
  Administered 2020-11-04 – 2020-11-06 (×3): 20 mg via ORAL
  Filled 2020-11-03 (×3): qty 1

## 2020-11-03 MED ORDER — INSULIN GLARGINE 100 UNIT/ML ~~LOC~~ SOLN
25.0000 [IU] | Freq: Two times a day (BID) | SUBCUTANEOUS | Status: DC
  Administered 2020-11-04 – 2020-11-06 (×6): 25 [IU] via SUBCUTANEOUS
  Filled 2020-11-03 (×8): qty 0.25

## 2020-11-03 NOTE — ED Notes (Signed)
Patient transported to MRI 

## 2020-11-03 NOTE — ED Notes (Signed)
Pt to MRI

## 2020-11-03 NOTE — Consult Note (Signed)
NEUROLOGY CONSULTATION NOTE   Date of service: Nov 03, 2020 Patient Name: Erica [name] MRN:  [id number] DOB:  1955/09/21 Reason for consult: "Acute Stroke" Requesting Provider: Tegeler, Gwenyth Allegra, * _ _ _   _ __   _ __ _ _  __ __   _ __   __ _  History of Present Illness  Erica [name] is a 65 y.o. 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  She also reports about 4 weeks history of pain on the right side of her vertex of the head which she describes as an episode of brain freeze.  Describes it as a sensation that she gets when she puts something really cold in her mouth really fast.  Reports she gets a 3 to 4-minute episode of brain freeze about a couple times a day for the last 3 to 4 weeks.  Reports that this has been triggered by her stress and anxiety.  She feels that the right vertex of her head is tender to palpation.  She feels that Tylenol helps with this pain.  LKW: 10/31/20 NIHSS: 1 for left sided numbness TPA/Thrombectomy: outside the window.    ROS   Constitutional Denies weight loss, fever and chills.   HEENT Denies changes in vision and hearing.   Respiratory Denies SOB and cough.   CV Denies palpitations and CP   GI Denies abdominal pain, nausea, vomiting and diarrhea.   GU Denies dysuria and urinary frequency.   MSK Denies myalgia and joint pain.   Skin Denies rash and pruritus.   Neurological Denies headache and syncope.   Psychiatric Denies recent changes in mood. Denies anxiety and depression.    Past History   Past Medical History:  Diagnosis Date  . Arthritis    especially in shoulders  . Asthma   . Depression   . Diabetes mellitus   . GERD (gastroesophageal reflux disease)   .  Headache(784.0)    "mild"  . Hypertension   . Mental disorder    depression  . Neuropathy    feet   . Pain    arthritis pain - takes tramadol as needed  . Rectal polyp    very little bleeding with bowel movements- no pain  . Suicide attempt [facility])    Past Surgical History:  Procedure Laterality Date  . ANAL RECTAL MANOMETRY N/A 07/13/2016   Procedure: ANO RECTAL MANOMETRY;  Surgeon: Leighton Ruff, MD;  Location: [facility];  Service: Endoscopy;  Laterality: N/A;  . CESAREAN SECTION    . EUS N/A 11/21/2012   Procedure: LOWER ENDOSCOPIC ULTRASOUND (EUS);  Surgeon: Arta Silence, MD;  Location: [facility];  Service: Endoscopy;  Laterality: N/A;  . FLEXIBLE SIGMOIDOSCOPY N/A 11/21/2012   Procedure: FLEXIBLE SIGMOIDOSCOPY;  Surgeon: Arta Silence, MD;  Location: [facility];  Service: Endoscopy;  Laterality: N/A;  . FLEXIBLE SIGMOIDOSCOPY N/A 01/28/2013   Procedure: FLEXIBLE SIGMOIDOSCOPY;  Surgeon: Leighton Ruff, MD;  Location: [facility];  Service: Endoscopy;  Laterality: N/A;  . LAPAROSCOPIC LOW ANTERIOR RESECTION N/A 01/29/2013   Procedure: LAPAROSCOPIC LOW ANTERIOR RESECTION, Rigid Proctoscopy;  Surgeon: Leighton Ruff, MD;  Location: [facility];  Service: General;  Laterality: N/A;  . LAPAROSCOPIC SIGMOID COLECTOMY N/A 11/14/2012   Procedure: DIAGNOSTIC LAPAROSCOPY  AND SIGMOIDMOIDOSCOPY ;  Surgeon: Rolm Bookbinder, MD;  Location: [facility];  Service: General;  Laterality: N/A;  . RECTAL ULTRASOUND N/A 07/13/2016   Procedure: RECTAL ULTRASOUND;  Surgeon: Leighton Ruff, MD;  Location: [facility];  Service: Endoscopy;  Laterality: N/A;  . TONSILLECTOMY    . TONSILLECTOMY AND ADENOIDECTOMY     Family History  Problem Relation Age of Onset  . Hypertension Father   . Stroke Father   . Diabetes Father   . Heart disease Mother   . Hypertension Mother   . Hyperlipidemia Mother   . Hypertension Sister   . Hypertension Brother   . Hypertension Sister   . Hypertension Brother   . Cancer  Other 47       colon cancer   . Cancer Maternal Aunt        cancer, unknown type   . Cancer Maternal Uncle        bone cancer   . Cancer Paternal Aunt        lung cancer  . Cancer Paternal Uncle        lung cancer  . Cancer Maternal Uncle        colon cancer and prostate cancer   . Cancer Maternal Uncle        prostate cancer    Social History   Socioeconomic History  . Marital status: Single    Spouse name: Not on file  . Number of children: 1  . Years of education: Not on file  . Highest education level: Not on file  Occupational History  . Not on file  Tobacco Use  . Smoking status: Former Smoker    Packs/day: 0.50    Years: 20.00    Pack years: 10.00    Types: Cigarettes    Quit date: 01/05/2018    Years since quitting: 2.8  . Smokeless tobacco: Never Used  . Tobacco comment: Unknown   Vaping Use  . Vaping Use: Never used  Substance and Sexual Activity  . Alcohol use: Not Currently  . Drug use: Not Currently    Types: "Crack" cocaine    Comment: Unknown   . Sexual activity: Never    Birth control/protection: None  Other Topics Concern  . Not on file  Social History Narrative  . Not on file   Social Determinants of Health   Financial Resource Strain: Not on file  Food Insecurity: Not on file  Transportation Needs: Not on file  Physical Activity: Not on file  Stress: Not on file  Social Connections: Not on file   Allergies  Allergen Reactions  . Penicillins Hives, Itching and Swelling    Tongue swells up Has patient had a PCN reaction causing immediate rash, facial/tongue/throat swelling, SOB or lightheadedness with hypotension: Yes Has patient had a PCN reaction causing severe rash involving mucus membranes or skin necrosis: Yes Has patient had a PCN reaction that required hospitalization Yes Has patient had a PCN reaction occurring within the last 10 years: No If all of the above answers are "NO", then may proceed with Cephalosporin use.      Medications  (Not in a hospital admission)    Vitals   Vitals:   11/03/20 1800 11/03/20 1815 11/03/20 1830 11/03/20 2015  BP: (!) 145/70 (!) 178/90 (!) 168/88 (!) 163/86  Pulse: (!) 54 (!) 52 (!) 47 (!) 50  Resp: (!) 24 (!) _0 Temp:      TempSrc:      SpO2:  100% 100% 100% 100%  Weight:      Height:         Body mass index is 32.52 kg/m.  Physical Exam   General: Laying comfortably in bed; in no acute distress.  HENT: Normal oropharynx and mucosa. Normal external appearance of ears and nose.  Neck: Supple, no pain or tenderness  CV: No JVD. No peripheral edema.  Pulmonary: Symmetric Chest rise. Normal respiratory effort.  Abdomen: Soft to touch, non-tender.  Ext: No cyanosis, edema, or deformity  Skin: No rash. Normal palpation of skin.   Musculoskeletal: Normal digits and nails by inspection. No clubbing.   Neurologic Examination  Mental status/Cognition: Alert, oriented to self, place, month and year, good attention.  Speech/language: Fluent, comprehension intact, object naming intact, repetition intact.  Cranial nerves:   CN II Pupils equal and reactive to light, no VF deficits    CN III,IV,VI EOM intact, no gaze preference or deviation, no nystagmus    CN V normal sensation in V1, V2, and V3 segments bilaterally    CN VII no asymmetry, no nasolabial fold flattening    CN VIII normal hearing to speech    CN IX & X normal palatal elevation, no uvular deviation    CN XI 5/5 head turn and 5/5 shoulder shrug bilaterally    CN XII midline tongue protrusion    Motor:  Muscle bulk: normal, tone normal, pronator drift none tremor none Mvmt Root Nerve  Muscle Right Left Comments  SA C5/6 Ax Deltoid 5 5   EF C5/6 Mc Biceps 5 5   EE C6/7/8 Rad Triceps 5 5   WF C6/7 Med FCR     WE C7/8 PIN ECU     F Ab C8/T1 U ADM/FDI 5 5   HF L1/2/3 Fem Illopsoas 5 5   KE L2/3/4 Fem Quad     DF L4/5 D Peron Tib Ant 5 5   PF S1/2 Tibial Grc/Sol 5 5    Reflexes:  Right  Left Comments  Pectoralis      Biceps (C5/6) 2 2   Brachioradialis (C5/6) 2 2    Triceps (C6/7) 2 2    Patellar (L3/4) 2 2    Achilles (S1)      Hoffman      Plantar down down   Jaw jerk    Sensation:  Light touch Decreased to light touch in left lower face, palmer aspect of left hand and mildly in left leg.   Pin prick    Temperature    Vibration   Proprioception    Coordination/Complex Motor:  - Finger to Nose intact BL - Heel to shin intact BL - Rapid alternating movement are normal - Gait: Deferred.  Labs   CBC:  Recent Labs  Lab 11/03/20 1632  WBC 5.1  NEUTROABS 3.4  HGB 10.5*  HCT 29.8*  MCV 92.5  PLT 147*    Basic Metabolic Panel:  Lab Results  Component Value Date   NA 140 11/03/2020   K 4.0 11/03/2020   CO2 21 (L) 11/03/2020   GLUCOSE 98 11/03/2020   BUN 18 11/03/2020   CREATININE 1.80 (H) 11/03/2020   CALCIUM 9.2 11/03/2020   GFRNONAA 31 (L) 11/03/2020   GFRAA 41 (L) 07/29/2020    MR Angio head without contrast and Carotid Duplex BL: pending  MRI Brain:(personally reviewed and agree with radiology read) IMPRESSION: Newly seen punctate acute infarction at the medial right parietooccipital junction. No other change since the most recent  exam of January.  Chronic small-vessel ischemic changes of the pons, cerebellum, thalami, basal ganglia and hemispheric white matter. Old cortical and subcortical infarctions in the occipital regions and frontoparietal regions.  Impression   Erica [name] is a 65 y.o. 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 Her neurologic examination is notable for decreased to light touch in left lower face, palmer aspect  of left hand and mildly in left leg. No objective weakness on exam thou.  I don't think that the noted medial R parieto-occipital junction stroke fits with her clinical presentation. 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 The trigeminal spinal nucleus is usually located in the lateral medulla but does go down into the high cervical spinal cord and its involvement can present with facial numbness. Will get a repeat MRI cervical spine to assess for progression.  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  Secondary Diagnosis: Essential (primary) hypertension, Type 2 diabetes mellitus w/o complications, Obesity and CKD Stage 3 (GFR 30-59)  Recommendations   Plan:  - Frequent Neuro checks per stroke unit protocol - Recommend Vascular imaging with MRA Angio Head without contrast and US Carotid doppler - Recommend MRI cervical spine without contrast. - Recommend obtaining TTE  - Recommend obtaining Lipid panel with LDL - Please start statin if LDL > 70 - Recommend HbA1c - Antithrombotic - Aspirin 81nmg daily along with plavix 35m daily x 21 days, followed by Aspirin 861mdaily alone. - Recommend DVT ppx - SBP goal - permissive hypertension first 24 h < 220/110. Held home meds.  - Recommend Telemetry monitoring for arrythmia - Recommend bedside swallow screen prior to PO intake. - Stroke education booklet - Recommend PT/OT/SLP consult   ______________________________________________________________________   Thank you for the opportunity to take part in the care of this patient. If you have any further questions, please contact the neurology consultation attending.  Signed,  [place] Number [phone] _ _   _ __   _ __ _ _  __ __   _ __   __ _

## 2020-11-03 NOTE — ED Notes (Signed)
Patient transported to X-ray 

## 2020-11-03 NOTE — ED Provider Notes (Signed)
[facility] EMERGENCY DEPARTMENT Provider Note   CSN: [id number] Arrival date & time: 11/03/20  1534     History Chief Complaint  Patient presents with  . Numbness    Erica [name] is a 65 y.o. 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  Patient was hospitalized in January for right caudate and right anterolateral thalamic infarction. Her neuro deficits involved left facial, upper and lower extremity weakness and numbness. Following discharge, she had been following with neurology, cardiology and her primary care physician as well as working closely with physical therapy and reports that her symptoms has been improving well. She states that for the past two weeks she has been developing 3-4 minute episodes of brain freezes in her right occiput occurring approximately three times daily. She describes the symptoms as exacerbated by emotional distress and improve with laying down. In addition, over the past two days, patient states that her left facial numbness, left hand numbness and left leg weakness have been gradually worsening.   Past Medical History:  Diagnosis Date  . Arthritis    especially in shoulders  . Asthma   . Depression   . Diabetes mellitus   . GERD (gastroesophageal reflux disease)   . Headache(784.0)    "mild"  . Hypertension   . Mental disorder    depression  . Neuropathy    feet   . Pain    arthritis pain - takes tramadol as needed  . Rectal polyp    very little bleeding with bowel movements- no pain  . Suicide attempt [facility])     Patient Active Problem List   Diagnosis Date Noted  . Stroke ([place]) 11/03/2020  . Benign essential HTN   . Pancytopenia ([place])   . Stage 3b chronic kidney  disease ([place])   . Anemia   . Labile blood glucose   . Labile blood pressure   . Diabetic peripheral neuropathy ([place])   . Essential hypertension   . AKI (acute kidney injury) ([place])   . Ataxia, late effect of cerebrovascular disease   . 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[place]) 06/30/2020  . Acute CVA (cerebrovascular accident) ([place]) 06/26/2020  . CVA (cerebral vascular accident) ([place]) 06/25/2020  . BMI 32.0-32.9,adult 06/03/2020  . Left upper lobe pneumonia 03/30/2020  . Chest pain 10/01/2019  . MDD (major depressive disorder), recurrent severe, without psychosis ([place]) 05/09/2019  . Suicide attempt ([place]) 05/09/2019  . Cocaine abuse ([place]) 05/09/2019  . Interstitial lung disease ([place]) 06/21/2016  . COPD (chronic obstructive pulmonary disease) ([place]) 06/21/2016  . Cough 06/21/2016  . GERD (gastroesophageal reflux disease) 06/21/2016  . Hypersomnia 06/21/2016  . Tobacco user 06/21/2016  . Rectal cancer ([place]) 01/30/2013   Past Surgical History:  Procedure Laterality Date  . ANAL RECTAL MANOMETRY N/A 07/13/2016   Procedure: ANO RECTAL MANOMETRY;  Surgeon: Leighton Ruff, MD;  Location: [facility];  Service: Endoscopy;  Laterality: N/A;  . CESAREAN SECTION    . EUS N/A 11/21/2012   Procedure: LOWER ENDOSCOPIC ULTRASOUND (EUS);  Surgeon: Arta Silence, MD;  Location: [facility];  Service: Endoscopy;  Laterality: N/A;  . FLEXIBLE SIGMOIDOSCOPY N/A 11/21/2012   Procedure: FLEXIBLE SIGMOIDOSCOPY;  Surgeon: Arta Silence, MD;  Location: [facility];  Service: Endoscopy;  Laterality: N/A;  . FLEXIBLE SIGMOIDOSCOPY N/A 01/28/2013  Procedure: FLEXIBLE SIGMOIDOSCOPY;  Surgeon: Leighton Ruff, MD;  Location: [facility];  Service: Endoscopy;  Laterality: N/A;  . LAPAROSCOPIC LOW ANTERIOR RESECTION N/A 01/29/2013   Procedure: LAPAROSCOPIC LOW ANTERIOR RESECTION, Rigid Proctoscopy;  Surgeon: Leighton Ruff, MD;  Location: [facility];  Service: General;  Laterality: N/A;  . LAPAROSCOPIC SIGMOID COLECTOMY N/A  11/14/2012   Procedure: DIAGNOSTIC LAPAROSCOPY AND SIGMOIDMOIDOSCOPY ;  Surgeon: Rolm Bookbinder, MD;  Location: [facility];  Service: General;  Laterality: N/A;  . RECTAL ULTRASOUND N/A 07/13/2016   Procedure: RECTAL ULTRASOUND;  Surgeon: Leighton Ruff, MD;  Location: [facility];  Service: Endoscopy;  Laterality: N/A;  . TONSILLECTOMY    . TONSILLECTOMY AND ADENOIDECTOMY      OB History    Gravida  3   Para      Term      Preterm      AB  2   Living  1     SAB      IAB  2   Ectopic      Multiple      Live Births             Family History  Problem Relation Age of Onset  . Hypertension Father   . Stroke Father   . Diabetes Father   . Heart disease Mother   . Hypertension Mother   . Hyperlipidemia Mother   . Hypertension Sister   . Hypertension Brother   . Hypertension Sister   . Hypertension Brother   . Cancer Other 47       colon cancer   . Cancer Maternal Aunt        cancer, unknown type   . Cancer Maternal Uncle        bone cancer   . Cancer Paternal Aunt        lung cancer  . Cancer Paternal Uncle        lung cancer  . Cancer Maternal Uncle        colon cancer and prostate cancer   . Cancer Maternal Uncle        prostate cancer    Social History   Tobacco Use  . Smoking status: Former Smoker    Packs/day: 0.50    Years: 20.00    Pack years: 10.00    Types: Cigarettes    Quit date: 01/05/2018    Years since quitting: 2.8  . Smokeless tobacco: Never Used  . Tobacco comment: Unknown   Vaping Use  . Vaping Use: Never used  Substance Use Topics  . Alcohol use: Not Currently  . Drug use: Not Currently    Types: "Crack" cocaine    Comment: Unknown    Home Medications Prior to Admission medications   Medication Sig Start Date End Date Taking? Authorizing Provider  acetaminophen (TYLENOL) 325 MG tablet Take 2 tablets (650 mg total) by mouth every 4 (four) hours as needed for mild pain (or temp > 37.5 C (99.5 F)). 07/14/20   Angiulli, Lavon Paganini,  PA-C  albuterol (VENTOLIN HFA) 108 (90 Base) MCG/ACT inhaler Inhale 2 puffs into the lungs every 4 (four) hours as needed for wheezing. 07/14/20   Angiulli, Lavon Paganini, PA-C  amLODipine (NORVASC) 10 MG tablet Take 1 tablet (10 mg total) by mouth daily. 07/14/20   Angiulli, Lavon Paganini, PA-C  atorvastatin (LIPITOR) 40 MG tablet Take 1 tablet (40 mg total) by mouth daily. 07/14/20   Angiulli, Lavon Paganini, PA-C  BD PEN NEEDLE NANO 2ND GEN 32G  X 4 MM MISC See admin instructions. 07/10/20   [provider]  blood glucose meter kit and supplies Dispense based on patient and insurance preference. Use up to four times daily as directed. (FOR ICD-10 E10.9, E11.9). 07/15/20   Angiulli, Lavon Paganini, PA-C  blood glucose meter kit and supplies Dispense based on patient and insurance preference. Use up to four times daily as directed. (FOR ICD-10 E10.9, E11.9). 07/15/20   Angiulli, Lavon Paganini, PA-C  busPIRone (BUSPAR) 10 MG tablet Take 1 tablet (10 mg total) by mouth daily at 6 (six) AM. 07/14/20   Angiulli, Lavon Paganini, PA-C  clopidogrel (PLAVIX) 75 MG tablet Take 1 tablet (75 mg total) by mouth daily. 07/14/20   Angiulli, Lavon Paganini, PA-C  diclofenac Sodium (VOLTAREN) 1 % GEL Apply 2 g topically 4 (four) times daily. 07/14/20   Angiulli, Lavon Paganini, PA-C  escitalopram (LEXAPRO) 20 MG tablet Take 1 tablet (20 mg total) by mouth daily. 07/14/20   Angiulli, Lavon Paganini, PA-C  famotidine (PEPCID) 20 MG tablet Take 1 tablet (20 mg total) by mouth daily. 07/14/20   Angiulli, Lavon Paganini, PA-C  fenofibrate (TRICOR) 145 MG tablet TAKE 1 TABLET(145 MG) BY MOUTH DAILY 08/11/20   Tolia, Sunit, DO  gabapentin (NEURONTIN) 300 MG capsule Take 1 capsule (300 mg total) by mouth 3 (three) times daily. 07/14/20   Angiulli, Lavon Paganini, PA-C  hydrALAZINE (APRESOLINE) 25 MG tablet Take 1.5 tablets (37.5 mg total) by mouth every 8 (eight) hours. 07/14/20   Angiulli, Lavon Paganini, PA-C  icosapent Ethyl (VASCEPA) 1 g capsule Take 2 capsules (2 g total) by mouth 2 (two) times daily.  07/14/20 10/12/20  Angiulli, Lavon Paganini, PA-C  metoprolol succinate (TOPROL-XL) 50 MG 24 hr tablet TAKE 1 TABLET(50 MG) BY MOUTH DAILY WITH OR IMMEDIATELY FOLLOWING A MEAL 07/14/20   Angiulli, Lavon Paganini, PA-C  Multiple Vitamin (MULTIVITAMIN ADULT PO) Take 1 tablet by mouth daily. 07/11/19   [provider]  nortriptyline (PAMELOR) 25 MG capsule Take 25 mg by mouth at bedtime as needed. 07/10/20   [provider]  Pirfenidone (ESBRIET) 267 MG TABS Take 3 tablets (801 mg total) by mouth in the morning, at noon, and at bedtime. 03/31/20   Martyn Ehrich, NP  SYMBICORT 160-4.5 MCG/ACT inhaler Inhale 2 puffs into the lungs 2 (two) times daily. 09/03/20   Brand Males, MD  Tiotropium Bromide Monohydrate (SPIRIVA RESPIMAT) 1.25 MCG/ACT AERS Inhale 2 puffs into the lungs daily. 09/03/20   Brand Males, MD  TOUJEO SOLOSTAR 300 UNIT/ML Solostar Pen Inject 12 Units into the skin 2 (two) times daily. 50 units  in th A.M. and  40 units subcutaneous in the P.M. 07/14/20   Angiulli, Lavon Paganini, PA-C   Allergies    Penicillins  Review of Systems   Review of Systems  Constitutional: Negative for chills and fever.  HENT: Positive for hearing loss. Negative for ear pain and sore throat.   Eyes: Negative for pain and visual disturbance.  Respiratory: Negative for cough and shortness of breath.   Cardiovascular: Negative for chest pain and palpitations.  Gastrointestinal: Negative for abdominal pain and vomiting.  Genitourinary: Negative for dysuria and hematuria.  Musculoskeletal: Negative for arthralgias and back pain.  Skin: Negative for color change and rash.  Neurological: Positive for weakness and numbness. Negative for dizziness, seizures, syncope, light-headedness and headaches.  All other systems reviewed and are negative.  Physical Exam Updated Vital Signs BP (!) 163/86   Pulse (!) 50  Temp 98.4 F (36.9 C) (Oral)   Resp 16   Ht _0  (1.6 m)   Wt 83.3 kg   SpO2 100%   BMI  32.52 kg/m   Physical Exam Vitals and nursing note reviewed.  Constitutional:      General: She is not in acute distress.    Appearance: She is well-developed. She is not ill-appearing.  HENT:     Head: Normocephalic and atraumatic.     Mouth/Throat:     Mouth: Mucous membranes are moist.     Pharynx: Oropharynx is clear.  Eyes:     Extraocular Movements: Extraocular movements intact.     Conjunctiva/sclera: Conjunctivae normal.     Pupils: Pupils are equal, round, and reactive to light.  Cardiovascular:     Rate and Rhythm: Regular rhythm. Bradycardia present.     Heart sounds: No murmur heard.   Pulmonary:     Effort: Pulmonary effort is normal. No respiratory distress.     Breath sounds: Normal breath sounds.  Abdominal:     Palpations: Abdomen is soft.     Tenderness: There is no abdominal tenderness.  Musculoskeletal:     Cervical back: Neck supple.     Right lower leg: No edema.     Left lower leg: No edema.  Skin:    General: Skin is warm and dry.     Capillary Refill: Capillary refill takes less than 2 seconds.  Neurological:     Mental Status: She is alert and oriented to person, place, and time.     Comments: Cranial nerves: Patient not participating consistently with extraocular movements although movements are intact without nystagmus. Patient without hearing in right ear. Remaining cranial nerves intact  Examination of upper extremities: 5/5 strength bilaterally, diminished sensation to light touch of left hand, intact sensation of right arm  Examination of lower extremities: 4/5 strength of left lower extremity, 5/5 strength right lower extremity, intact sensation to light touch bilaterally.  Psychiatric:        Mood and Affect: Mood normal.        Behavior: Behavior normal.    ED Results / Procedures / Treatments   Labs (all labs ordered are listed, but only abnormal results are displayed) Labs Reviewed  COMPREHENSIVE METABOLIC PANEL - Abnormal;  Notable for the following components:      Result Value   Chloride 113 (*)    CO2 21 (*)    Creatinine, Ser 1.80 (*)    Albumin 3.4 (*)    AST 44 (*)    Total Bilirubin 1.3 (*)    GFR, Estimated 31 (*)    All other components within normal limits  CBC WITH DIFFERENTIAL/PLATELET - Abnormal; Notable for the following components:   RBC 3.22 (*)    Hemoglobin 10.5 (*)    HCT 29.8 (*)    RDW 16.3 (*)    Platelets 147 (*)    All other components within normal limits  URINALYSIS, ROUTINE W REFLEX MICROSCOPIC - Abnormal; Notable for the following components:   APPearance CLOUDY (*)    Protein, ur 30 (*)    Leukocytes,Ua LARGE (*)    WBC, UA >50 (*)    Bacteria, UA MANY (*)    All other components within normal limits   EKG EKG Interpretation  Date/Time:  Tuesday Nov 03 2020 15:40:13 EDT Ventricular Rate:  47 PR Interval:  171 QRS Duration: 90 QT Interval:  491 QTC Calculation: 435 R Axis:   -18 Text  Interpretation: Sinus bradycardia Probable left atrial enlargement Abnormal R-wave progression, late transition Probable left ventricular hypertrophy when compared to prior,slower rate. No STEMI Confirmed by Antony Blackbird [phone]) on 11/03/2020 3:53:07 PM  Radiology DG Chest 2 View  Result Date: 11/03/2020 CLINICAL DATA:  Left-sided weakness for 3 days. EXAM: CHEST - 2 VIEW COMPARISON:  PA and lateral chest 03/30/2020.  CT chest 04/12/2019. FINDINGS: Extensive pulmonary fibrosis is identified as seen on the prior exams. No consolidative process, pneumothorax or effusion. Heart size is normal. No acute or focal bony abnormality. IMPRESSION: No acute disease. Pulmonary fibrosis. Electronically Signed   By: Inge Rise M.D.   On: 11/03/2020 17:38   MR Brain Wo Contrast (neuro protocol)  Result Date: 11/03/2020 CLINICAL DATA:  Worsening of left arm and leg numbness. Acute infarction in January of this year affecting the right thalamus. EXAM: MRI HEAD WITHOUT CONTRAST TECHNIQUE:  Multiplanar, multiecho pulse sequences of the brain and surrounding structures were obtained without intravenous contrast. COMPARISON:  06/25/2010 FINDINGS: Brain: New punctate acute infarction at the right medial parietooccipital junction region. No other acute finding. Chronic small-vessel ischemic changes affect the pons. Few old small vessel cerebellar infarctions. Old small vessel infarctions in the right thalamus, the left thalamus, the basal ganglia and cerebral hemispheric white matter. Scattered old cortical and subcortical infarctions in both occipital lobes and both frontoparietal regions. No mass, hemorrhage, hydrocephalus or extra-axial collection. Vascular: Major vessels at the base of the brain show flow. Skull and upper cervical spine: Negative Sinuses/Orbits: Clear/normal Other: None IMPRESSION: Newly seen punctate acute infarction at the medial right parietooccipital junction. No other change since the most recent exam of January. Chronic small-vessel ischemic changes of the pons, cerebellum, thalami, basal ganglia and hemispheric white matter. Old cortical and subcortical infarctions in the occipital regions and frontoparietal regions. Electronically Signed   By: Nelson Chimes M.D.   On: 11/03/2020 19:14    Medications Ordered in ED Medications - No data to display  ED Course  I have reviewed the triage vital signs and the nursing notes.  Pertinent labs & imaging results that were available during my care of the patient were reviewed by me and considered in my medical decision making (see chart for details).    MDM Rules/Calculators/A&P                          Ms. Janus is a 65 year old woman with past medical history most significant for recent right caudate and right anterolateral thalamic CVA secondary to small vessel disease who presents for evaluation of right occipital "brain freezes" and worsening of left-sided deficits. Patient's presentation concerning for recrudescence  event due to an unknown etiology. Discussed case with neurology with recommendation for MRI brain without contrast. In addition, we will order BMP, CBC, urinalysis and CXR to evaluate for triggering illness.  Patient hypertensive and mildly bradycardic. CBC with stable normocytic anemia. CMP with creatinine at baseline of approximately 1.80. Urinalysis with large leukocytes and many bacteria although no dysuria so will consider asymptomatic bacteriuria. Patient's MRI brain revealing new punctate acute infarction at the medial right parietooccipital junction.  Discussed case with neurology with recommendation for admission for stroke work-up. Will place consult for unassigned admission to medicine. Discussed case with hospitalist who will admit patient.  Final Clinical Impression(s) / ED Diagnoses Final diagnoses:  Cerebrovascular accident (CVA), unspecified mechanism ([place])   Rx / DC Orders ED Discharge Orders    None  Cato Mulligan, MD 11/03/20 2056    Tegeler, Gwenyth Allegra, MD 11/04/20 [phone]

## 2020-11-03 NOTE — H&P (Addendum)
History and Physical    Erica [name] LMB:[id number] DOB: [birth year] DOA: 11/03/2020  PCP: Nolene Ebbs, MD   Patient coming from: Home   Chief Complaint: Left hand and face numbness, left leg weakness   HPI: Erica [name] is a 65 y.o. female with medical history significant for COPD, pulmonary fibrosis, coronary artery disease, depression, rectal cancer, CKD IIIb, diabetes mellitus, history of right thalamic infarction in January 2022, and hydrosyringomyelia of the cervical spine, now presenting to the emergency department with 3 days of left hand and facial numbness, and left leg weakness.  She has also been experiencing headache on the right side for approximately 4 weeks.  Headache has been intermittent, occurring a couple times a day, and attributed by the patient to anxiety.  Headache has been relieved with acetaminophen.  The left hand and facial numbness have been present for the past 3 days.  ED Course: Upon arrival to the ED, patient is found to be afebrile, saturating well on room air, bradycardic in the upper 40s to 50s, and with blood pressure 180/80.  EKG features sinus bradycardia with rate 47.  Chest x-ray negative for acute cardiopulmonary disease.  MRI brain demonstrates a new acute infarction at the medial right parietooccipital junction in addition to chronic small vessel ischemic changes.  Chemistry panel notable for creatinine 1.80.  CBC with hemoglobin 10.5 and platelets 147,000.  Neurology was consulted by the ED physician and hospitalists asked to admit. RN states that she passed swallow screen.   Review of Systems:  All other systems reviewed and apart from HPI, are negative.  Past Medical History:  Diagnosis Date  . Arthritis    especially in shoulders  . Asthma   . Depression   . Diabetes mellitus   . GERD (gastroesophageal reflux disease)   . Headache(784.0)    "mild"  . Hypertension   . Mental disorder    depression  . Neuropathy    feet   . Pain     arthritis pain - takes tramadol as needed  . Rectal polyp    very little bleeding with bowel movements- no pain  . Suicide attempt [facility])     Past Surgical History:  Procedure Laterality Date  . ANAL RECTAL MANOMETRY N/A 07/13/2016   Procedure: ANO RECTAL MANOMETRY;  Surgeon: Leighton Ruff, MD;  Location: [facility] ENDOSCOPY;  Service: Endoscopy;  Laterality: N/A;  . CESAREAN SECTION    . EUS N/A 11/21/2012   Procedure: LOWER ENDOSCOPIC ULTRASOUND (EUS);  Surgeon: Arta Silence, MD;  Location: [facility];  Service: Endoscopy;  Laterality: N/A;  . FLEXIBLE SIGMOIDOSCOPY N/A 11/21/2012   Procedure: FLEXIBLE SIGMOIDOSCOPY;  Surgeon: Arta Silence, MD;  Location: [facility];  Service: Endoscopy;  Laterality: N/A;  . FLEXIBLE SIGMOIDOSCOPY N/A 01/28/2013   Procedure: FLEXIBLE SIGMOIDOSCOPY;  Surgeon: Leighton Ruff, MD;  Location: [facility];  Service: Endoscopy;  Laterality: N/A;  . LAPAROSCOPIC LOW ANTERIOR RESECTION N/A 01/29/2013   Procedure: LAPAROSCOPIC LOW ANTERIOR RESECTION, Rigid Proctoscopy;  Surgeon: Leighton Ruff, MD;  Location: [facility]S;  Service: General;  Laterality: N/A;  . LAPAROSCOPIC SIGMOID COLECTOMY N/A 11/14/2012   Procedure: DIAGNOSTIC LAPAROSCOPY AND SIGMOIDMOIDOSCOPY ;  Surgeon: Rolm Bookbinder, MD;  Location: [facility];  Service: General;  Laterality: N/A;  . RECTAL ULTRASOUND N/A 07/13/2016   Procedure: RECTAL ULTRASOUND;  Surgeon: Leighton Ruff, MD;  Location: [facility];  Service: Endoscopy;  Laterality: N/A;  . TONSILLECTOMY    . TONSILLECTOMY AND ADENOIDECTOMY      Social History:  reports that she quit smoking about 2 years ago. Her smoking use included cigarettes. She has a 10.00 pack-year smoking history. She has never used smokeless tobacco. She reports previous alcohol use. She reports previous drug use. Drug: "Crack" cocaine.  Allergies  Allergen Reactions  . Penicillins Hives, Itching and Swelling    Tongue swells up Has patient had a PCN reaction causing  immediate rash, facial/tongue/throat swelling, SOB or lightheadedness with hypotension: Yes Has patient had a PCN reaction causing severe rash involving mucus membranes or skin necrosis: Yes Has patient had a PCN reaction that required hospitalization Yes Has patient had a PCN reaction occurring within the last 10 years: No If all of the above answers are "NO", then may proceed with Cephalosporin use.     Family History  Problem Relation Age of Onset  . Hypertension Father   . Stroke Father   . Diabetes Father   . Heart disease Mother   . Hypertension Mother   . Hyperlipidemia Mother   . Hypertension Sister   . Hypertension Brother   . Hypertension Sister   . Hypertension Brother   . Cancer Other 47       colon cancer   . Cancer Maternal Aunt        cancer, unknown type   . Cancer Maternal Uncle        bone cancer   . Cancer Paternal Aunt        lung cancer  . Cancer Paternal Uncle        lung cancer  . Cancer Maternal Uncle        colon cancer and prostate cancer   . Cancer Maternal Uncle        prostate cancer      Prior to Admission medications   Medication Sig Start Date End Date Taking? Authorizing Provider  acetaminophen (TYLENOL) 325 MG tablet Take 2 tablets (650 mg total) by mouth every 4 (four) hours as needed for mild pain (or temp > 37.5 C (99.5 F)). 07/14/20   Angiulli, Lavon Paganini, PA-C  albuterol (VENTOLIN HFA) 108 (90 Base) MCG/ACT inhaler Inhale 2 puffs into the lungs every 4 (four) hours as needed for wheezing. 07/14/20   Angiulli, Lavon Paganini, PA-C  amLODipine (NORVASC) 10 MG tablet Take 1 tablet (10 mg total) by mouth daily. 07/14/20   Angiulli, Lavon Paganini, PA-C  atorvastatin (LIPITOR) 40 MG tablet Take 1 tablet (40 mg total) by mouth daily. 07/14/20   Angiulli, Lavon Paganini, PA-C  BD PEN NEEDLE NANO 2ND GEN 32G X 4 MM MISC See admin instructions. 07/10/20   [provider]  blood glucose meter kit and supplies Dispense based on patient and insurance preference. Use  up to four times daily as directed. (FOR ICD-10 E10.9, E11.9). 07/15/20   Angiulli, Lavon Paganini, PA-C  blood glucose meter kit and supplies Dispense based on patient and insurance preference. Use up to four times daily as directed. (FOR ICD-10 E10.9, E11.9). 07/15/20   Angiulli, Lavon Paganini, PA-C  busPIRone (BUSPAR) 10 MG tablet Take 1 tablet (10 mg total) by mouth daily at 6 (six) AM. 07/14/20   Angiulli, Lavon Paganini, PA-C  clopidogrel (PLAVIX) 75 MG tablet Take 1 tablet (75 mg total) by mouth daily. 07/14/20   Angiulli, Lavon Paganini, PA-C  diclofenac Sodium (VOLTAREN) 1 % GEL Apply 2 g topically 4 (four) times daily. 07/14/20   Angiulli, Lavon Paganini, PA-C  escitalopram (LEXAPRO) 20 MG tablet Take 1 tablet (20 mg total) by mouth  daily. 07/14/20   Angiulli, Lavon Paganini, PA-C  famotidine (PEPCID) 20 MG tablet Take 1 tablet (20 mg total) by mouth daily. 07/14/20   Angiulli, Lavon Paganini, PA-C  fenofibrate (TRICOR) 145 MG tablet TAKE 1 TABLET(145 MG) BY MOUTH DAILY 08/11/20   Tolia, Sunit, DO  gabapentin (NEURONTIN) 300 MG capsule Take 1 capsule (300 mg total) by mouth 3 (three) times daily. 07/14/20   Angiulli, Lavon Paganini, PA-C  hydrALAZINE (APRESOLINE) 25 MG tablet Take 1.5 tablets (37.5 mg total) by mouth every 8 (eight) hours. 07/14/20   Angiulli, Lavon Paganini, PA-C  icosapent Ethyl (VASCEPA) 1 g capsule Take 2 capsules (2 g total) by mouth 2 (two) times daily. 07/14/20 10/12/20  Angiulli, Lavon Paganini, PA-C  metoprolol succinate (TOPROL-XL) 50 MG 24 hr tablet TAKE 1 TABLET(50 MG) BY MOUTH DAILY WITH OR IMMEDIATELY FOLLOWING A MEAL 07/14/20   Angiulli, Lavon Paganini, PA-C  Multiple Vitamin (MULTIVITAMIN ADULT PO) Take 1 tablet by mouth daily. 07/11/19   [provider]  nortriptyline (PAMELOR) 25 MG capsule Take 25 mg by mouth at bedtime as needed. 07/10/20   [provider]  Pirfenidone (ESBRIET) 267 MG TABS Take 3 tablets (801 mg total) by mouth in the morning, at noon, and at bedtime. 03/31/20   Martyn Ehrich, NP  SYMBICORT 160-4.5 MCG/ACT  inhaler Inhale 2 puffs into the lungs 2 (two) times daily. 09/03/20   Brand Males, MD  Tiotropium Bromide Monohydrate (SPIRIVA RESPIMAT) 1.25 MCG/ACT AERS Inhale 2 puffs into the lungs daily. 09/03/20   Brand Males, MD  TOUJEO SOLOSTAR 300 UNIT/ML Solostar Pen Inject 12 Units into the skin 2 (two) times daily. 50 units  in th A.M. and  40 units subcutaneous in the P.M. 07/14/20   Cathlyn Parsons, PA-C    Physical Exam: Vitals:   11/03/20 1815 11/03/20 1830 11/03/20 2015 11/03/20 2100  BP: (!) 178/90 (!) 168/88 (!) 163/86 (!) 147/92  Pulse: (!) 52 (!) 47 (!) 50 65  Resp: (!) _0 Temp:      TempSrc:      SpO2: 100% 100% 100% 97%  Weight:      Height:        Constitutional: NAD, calm  Eyes: PERTLA, lids and conjunctivae normal ENMT: Mucous membranes are moist. Posterior pharynx clear of any exudate or lesions.   Neck: supple, no masses  Respiratory:  no wheezing, no crackles. No accessory muscle use.  Cardiovascular: S1 & S2 heard, regular rate and rhythm. No extremity edema.   Abdomen: No distension, no tenderness, soft. Bowel sounds active.  Musculoskeletal: no clubbing / cyanosis. No joint deformity upper and lower extremities.   Skin: no significant rashes, lesions, ulcers. Warm, dry, well-perfused. Neurologic: CN 2-12 grossly intact. Sensation to light touch diminished in distal LUE. Strength 5/5 in all 4 limbs.  Psychiatric: Alert and oriented to person, place, and situation. Pleasant and cooperative.    Labs and Imaging on Admission: I have personally reviewed following labs and imaging studies  CBC: Recent Labs  Lab 11/03/20 1632  WBC 5.1  NEUTROABS 3.4  HGB 10.5*  HCT 29.8*  MCV 92.5  PLT 374*   Basic Metabolic Panel: Recent Labs  Lab 11/03/20 1632  NA 140  K 4.0  CL 113*  CO2 21*  GLUCOSE 98  BUN 18  CREATININE 1.80*  CALCIUM 9.2   GFR: Estimated Creatinine Clearance: 31.5 mL/min (A) (by C-G formula based on SCr of 1.8 mg/dL  (H)). Liver Function Tests:  Recent Labs  Lab 11/03/20 1632  AST 44*  ALT 33  ALKPHOS 45  BILITOT 1.3*  PROT 6.9  ALBUMIN 3.4*   No results for input(s): LIPASE, AMYLASE in the last 168 hours. No results for input(s): AMMONIA in the last 168 hours. Coagulation Profile: No results for input(s): INR, PROTIME in the last 168 hours. Cardiac Enzymes: No results for input(s): CKTOTAL, CKMB, CKMBINDEX, TROPONINI in the last 168 hours. BNP (last 3 results) No results for input(s): PROBNP in the last 8760 hours. HbA1C: No results for input(s): HGBA1C in the last 72 hours. CBG: No results for input(s): GLUCAP in the last 168 hours. Lipid Profile: No results for input(s): CHOL, HDL, LDLCALC, TRIG, CHOLHDL, LDLDIRECT in the last 72 hours. Thyroid Function Tests: No results for input(s): TSH, T4TOTAL, FREET4, T3FREE, THYROIDAB in the last 72 hours. Anemia Panel: No results for input(s): VITAMINB12, FOLATE, FERRITIN, TIBC, IRON, RETICCTPCT in the last 72 hours. Urine analysis:    Component Value Date/Time   COLORURINE YELLOW 11/03/2020 1638   APPEARANCEUR CLOUDY (A) 11/03/2020 1638   LABSPEC 1.011 11/03/2020 1638   PHURINE 5.0 11/03/2020 [place] 11/03/2020 1638   HGBUR NEGATIVE 11/03/2020 [place] 11/03/2020 [place] 11/03/2020 1638   PROTEINUR 30 (A) 11/03/2020 1638   NITRITE NEGATIVE 11/03/2020 1638   LEUKOCYTESUR LARGE (A) 11/03/2020 1638   Sepsis Labs: _0 (procalcitonin:4,lacticidven:4) )No results found for this or any previous visit (from the past 240 hour(s)).   Radiological Exams on Admission: DG Chest 2 View  Result Date: 11/03/2020 CLINICAL DATA:  Left-sided weakness for 3 days. EXAM: CHEST - 2 VIEW COMPARISON:  PA and lateral chest 03/30/2020.  CT chest 04/12/2019. FINDINGS: Extensive pulmonary fibrosis is identified as seen on the prior exams. No consolidative process, pneumothorax or effusion. Heart size is  normal. No acute or focal bony abnormality. IMPRESSION: No acute disease. Pulmonary fibrosis. Electronically Signed   By: Inge Rise M.D.   On: 11/03/2020 17:38   MR Brain Wo Contrast (neuro protocol)  Result Date: 11/03/2020 CLINICAL DATA:  Worsening of left arm and leg numbness. Acute infarction in January of this year affecting the right thalamus. EXAM: MRI HEAD WITHOUT CONTRAST TECHNIQUE: Multiplanar, multiecho pulse sequences of the brain and surrounding structures were obtained without intravenous contrast. COMPARISON:  06/25/2010 FINDINGS: Brain: New punctate acute infarction at the right medial parietooccipital junction region. No other acute finding. Chronic small-vessel ischemic changes affect the pons. Few old small vessel cerebellar infarctions. Old small vessel infarctions in the right thalamus, the left thalamus, the basal ganglia and cerebral hemispheric white matter. Scattered old cortical and subcortical infarctions in both occipital lobes and both frontoparietal regions. No mass, hemorrhage, hydrocephalus or extra-axial collection. Vascular: Major vessels at the base of the brain show flow. Skull and upper cervical spine: Negative Sinuses/Orbits: Clear/normal Other: None IMPRESSION: Newly seen punctate acute infarction at the medial right parietooccipital junction. No other change since the most recent exam of January. Chronic small-vessel ischemic changes of the pons, cerebellum, thalami, basal ganglia and hemispheric white matter. Old cortical and subcortical infarctions in the occipital regions and frontoparietal regions. Electronically Signed   By: Nelson Chimes M.D.   On: 11/03/2020 19:14    EKG: Independently reviewed. Sinus bradycardia, rate 47.   Assessment/Plan   1. Ischemic stroke  - Presents with 3 days of left hand and face tingling and mild LLE weakness and is found to have acute infarct in right parietooccipital  junction  - Appreciate neurology assessment and  recommendations  - Continue cardiac monitoring, neuro checks, permissive HTN   - Check MRI c-spine, MRA head, carotid US, echo, lipids, and A1c  - Start DAPT with aspirin and Plavix, consult PT/OT/SLP     2. Hydrosyringomyelia  - Check MRI c-spine    3. Hypertension  - Permit HTN to 217/981 in acute phase of ischemic CVA    4. CKD IIIb  - SCr is 1.80 on admission, up from 1.53 in February 2022  - Renally-dose medications, gentle IVF hydration overnight, repeat chem panel in am    5. COPD; IPF  - No cough or wheezing on admission, continue pirfenidone, ICS/LABA, Spiriva, and as-needed albuterol    6. Depression  - Stable, continue home regimen   7. Sinus bradycardia  - Rate upper 40s to 50s in ED  - Hold metoprolol, continue cardiac monitoring   8. Type II DM  - A1c was only 5.2% in January  - Continue CBG checks and long-acting insulin, use low-intensity SSI if needed     DVT prophylaxis: Lovenox  Code Status: Full  Level of Care: Level of care: Telemetry Medical Family Communication: none present  Disposition Plan:  Patient is from: Home  Anticipated d/c is to: TBD Anticipated d/c date is: 5/25 or 11/05/20 Patient currently: Pending MRA head, carotid US, echo, lipid panel, therapy assessments   Consults called: Neurology  Admission status: Observation     Vianne Bulls, MD [facility]  11/03/2020, 9:28 PM

## 2020-11-03 NOTE — ED Triage Notes (Signed)
Pt BIB [facility] From home with home health c/o acute onset of left hand and facial numbness x3 days. States feeling "brain freeze" x 3 weeks. No other neuro symptoms. Pt A&Ox4

## 2020-11-04 ENCOUNTER — Ambulatory Visit (HOSPITAL_COMMUNITY): Payer: Medicare HMO

## 2020-11-04 ENCOUNTER — Observation Stay (HOSPITAL_COMMUNITY): Payer: Medicare HMO

## 2020-11-04 DIAGNOSIS — I10 Essential (primary) hypertension: Secondary | ICD-10-CM | POA: Diagnosis not present

## 2020-11-04 DIAGNOSIS — I6300 Cerebral infarction due to thrombosis of unspecified precerebral artery: Secondary | ICD-10-CM | POA: Diagnosis not present

## 2020-11-04 DIAGNOSIS — I6389 Other cerebral infarction: Secondary | ICD-10-CM | POA: Diagnosis not present

## 2020-11-04 DIAGNOSIS — F32A Depression, unspecified: Secondary | ICD-10-CM | POA: Diagnosis present

## 2020-11-04 DIAGNOSIS — R001 Bradycardia, unspecified: Secondary | ICD-10-CM | POA: Diagnosis present

## 2020-11-04 DIAGNOSIS — G441 Vascular headache, not elsewhere classified: Secondary | ICD-10-CM | POA: Diagnosis present

## 2020-11-04 DIAGNOSIS — Z20822 Contact with and (suspected) exposure to covid-19: Secondary | ICD-10-CM | POA: Diagnosis present

## 2020-11-04 DIAGNOSIS — Z87891 Personal history of nicotine dependence: Secondary | ICD-10-CM | POA: Diagnosis not present

## 2020-11-04 DIAGNOSIS — Z880 Allergy status to penicillin: Secondary | ICD-10-CM | POA: Diagnosis not present

## 2020-11-04 DIAGNOSIS — Z833 Family history of diabetes mellitus: Secondary | ICD-10-CM | POA: Diagnosis not present

## 2020-11-04 DIAGNOSIS — I5032 Chronic diastolic (congestive) heart failure: Secondary | ICD-10-CM | POA: Diagnosis present

## 2020-11-04 DIAGNOSIS — R200 Anesthesia of skin: Secondary | ICD-10-CM | POA: Diagnosis present

## 2020-11-04 DIAGNOSIS — Z823 Family history of stroke: Secondary | ICD-10-CM | POA: Diagnosis not present

## 2020-11-04 DIAGNOSIS — J84112 Idiopathic pulmonary fibrosis: Secondary | ICD-10-CM | POA: Diagnosis present

## 2020-11-04 DIAGNOSIS — Z85048 Personal history of other malignant neoplasm of rectum, rectosigmoid junction, and anus: Secondary | ICD-10-CM | POA: Diagnosis not present

## 2020-11-04 DIAGNOSIS — D709 Neutropenia, unspecified: Secondary | ICD-10-CM | POA: Diagnosis not present

## 2020-11-04 DIAGNOSIS — D72819 Decreased white blood cell count, unspecified: Secondary | ICD-10-CM | POA: Diagnosis not present

## 2020-11-04 DIAGNOSIS — E1142 Type 2 diabetes mellitus with diabetic polyneuropathy: Secondary | ICD-10-CM | POA: Diagnosis present

## 2020-11-04 DIAGNOSIS — Z794 Long term (current) use of insulin: Secondary | ICD-10-CM | POA: Diagnosis not present

## 2020-11-04 DIAGNOSIS — N1832 Chronic kidney disease, stage 3b: Secondary | ICD-10-CM | POA: Diagnosis present

## 2020-11-04 DIAGNOSIS — E1122 Type 2 diabetes mellitus with diabetic chronic kidney disease: Secondary | ICD-10-CM | POA: Diagnosis present

## 2020-11-04 DIAGNOSIS — E785 Hyperlipidemia, unspecified: Secondary | ICD-10-CM | POA: Diagnosis present

## 2020-11-04 DIAGNOSIS — Z800 Family history of malignant neoplasm of digestive organs: Secondary | ICD-10-CM | POA: Diagnosis not present

## 2020-11-04 DIAGNOSIS — R531 Weakness: Secondary | ICD-10-CM | POA: Diagnosis present

## 2020-11-04 DIAGNOSIS — K219 Gastro-esophageal reflux disease without esophagitis: Secondary | ICD-10-CM | POA: Diagnosis present

## 2020-11-04 DIAGNOSIS — F419 Anxiety disorder, unspecified: Secondary | ICD-10-CM | POA: Diagnosis present

## 2020-11-04 DIAGNOSIS — J439 Emphysema, unspecified: Secondary | ICD-10-CM | POA: Diagnosis not present

## 2020-11-04 DIAGNOSIS — G950 Syringomyelia and syringobulbia: Secondary | ICD-10-CM | POA: Diagnosis present

## 2020-11-04 DIAGNOSIS — E1169 Type 2 diabetes mellitus with other specified complication: Secondary | ICD-10-CM | POA: Diagnosis not present

## 2020-11-04 DIAGNOSIS — Z6832 Body mass index (BMI) 32.0-32.9, adult: Secondary | ICD-10-CM | POA: Diagnosis not present

## 2020-11-04 DIAGNOSIS — D61818 Other pancytopenia: Secondary | ICD-10-CM | POA: Diagnosis present

## 2020-11-04 DIAGNOSIS — E1165 Type 2 diabetes mellitus with hyperglycemia: Secondary | ICD-10-CM | POA: Diagnosis present

## 2020-11-04 DIAGNOSIS — Z7982 Long term (current) use of aspirin: Secondary | ICD-10-CM | POA: Diagnosis not present

## 2020-11-04 DIAGNOSIS — E669 Obesity, unspecified: Secondary | ICD-10-CM | POA: Diagnosis present

## 2020-11-04 DIAGNOSIS — I130 Hypertensive heart and chronic kidney disease with heart failure and stage 1 through stage 4 chronic kidney disease, or unspecified chronic kidney disease: Secondary | ICD-10-CM | POA: Diagnosis present

## 2020-11-04 DIAGNOSIS — I639 Cerebral infarction, unspecified: Secondary | ICD-10-CM | POA: Diagnosis not present

## 2020-11-04 DIAGNOSIS — I6381 Other cerebral infarction due to occlusion or stenosis of small artery: Secondary | ICD-10-CM | POA: Diagnosis present

## 2020-11-04 DIAGNOSIS — I2510 Atherosclerotic heart disease of native coronary artery without angina pectoris: Secondary | ICD-10-CM | POA: Diagnosis present

## 2020-11-04 DIAGNOSIS — Z8673 Personal history of transient ischemic attack (TIA), and cerebral infarction without residual deficits: Secondary | ICD-10-CM | POA: Diagnosis not present

## 2020-11-04 DIAGNOSIS — I129 Hypertensive chronic kidney disease with stage 1 through stage 4 chronic kidney disease, or unspecified chronic kidney disease: Secondary | ICD-10-CM | POA: Diagnosis present

## 2020-11-04 DIAGNOSIS — N183 Chronic kidney disease, stage 3 unspecified: Secondary | ICD-10-CM | POA: Diagnosis present

## 2020-11-04 DIAGNOSIS — D649 Anemia, unspecified: Secondary | ICD-10-CM | POA: Diagnosis not present

## 2020-11-04 DIAGNOSIS — R112 Nausea with vomiting, unspecified: Secondary | ICD-10-CM | POA: Diagnosis not present

## 2020-11-04 DIAGNOSIS — I69354 Hemiplegia and hemiparesis following cerebral infarction affecting left non-dominant side: Secondary | ICD-10-CM | POA: Diagnosis not present

## 2020-11-04 DIAGNOSIS — Z79899 Other long term (current) drug therapy: Secondary | ICD-10-CM | POA: Diagnosis not present

## 2020-11-04 DIAGNOSIS — E119 Type 2 diabetes mellitus without complications: Secondary | ICD-10-CM | POA: Diagnosis not present

## 2020-11-04 DIAGNOSIS — J449 Chronic obstructive pulmonary disease, unspecified: Secondary | ICD-10-CM | POA: Diagnosis present

## 2020-11-04 LAB — CBC
HCT: 26.2 % — ABNORMAL LOW (ref 36.0–46.0)
Hemoglobin: 9.2 g/dL — ABNORMAL LOW (ref 12.0–15.0)
MCH: 32.9 pg (ref 26.0–34.0)
MCHC: 35.1 g/dL (ref 30.0–36.0)
MCV: 93.6 fL (ref 80.0–100.0)
Platelets: UNDETERMINED 10*3/uL (ref 150–400)
RBC: 2.8 MIL/uL — ABNORMAL LOW (ref 3.87–5.11)
RDW: 16 % — ABNORMAL HIGH (ref 11.5–15.5)
WBC: 5 10*3/uL (ref 4.0–10.5)
nRBC: 0 % (ref 0.0–0.2)

## 2020-11-04 LAB — ECHOCARDIOGRAM COMPLETE
AR max vel: 1.58 cm2
AV Area VTI: 1.57 cm2
AV Area mean vel: 1.64 cm2
AV Mean grad: 4 mmHg
AV Peak grad: 8.1 mmHg
Ao pk vel: 1.42 m/s
Area-P 1/2: 3.17 cm2
Calc EF: 64 %
Height: 63 in
MV VTI: 1.46 cm2
P 1/2 time: 754 msec
S' Lateral: 2.5 cm
Single Plane A2C EF: 48.3 %
Single Plane A4C EF: 73.3 %
Weight: 2937.59 oz

## 2020-11-04 LAB — BASIC METABOLIC PANEL
Anion gap: 7 (ref 5–15)
BUN: 19 mg/dL (ref 8–23)
CO2: 21 mmol/L — ABNORMAL LOW (ref 22–32)
Calcium: 8.8 mg/dL — ABNORMAL LOW (ref 8.9–10.3)
Chloride: 113 mmol/L — ABNORMAL HIGH (ref 98–111)
Creatinine, Ser: 1.7 mg/dL — ABNORMAL HIGH (ref 0.44–1.00)
GFR, Estimated: 33 mL/min — ABNORMAL LOW (ref >60)
Glucose, Bld: 100 mg/dL — ABNORMAL HIGH (ref 70–99)
Potassium: 3.5 mmol/L (ref 3.5–5.1)
Sodium: 141 mmol/L (ref 135–145)

## 2020-11-04 LAB — CBG MONITORING, ED
Glucose-Capillary: 87 mg/dL (ref 70–99)
Glucose-Capillary: 82 mg/dL (ref 70–99)

## 2020-11-04 LAB — LIPID PANEL
Cholesterol: 131 mg/dL (ref 0–200)
HDL: 58 mg/dL (ref >40)
LDL Cholesterol: 56 mg/dL (ref 0–99)
Total CHOL/HDL Ratio: 2.3 RATIO
Triglycerides: 87 mg/dL (ref <150)
VLDL: 17 mg/dL (ref 0–40)

## 2020-11-04 LAB — SARS CORONAVIRUS 2 (TAT 6-24 HRS): SARS Coronavirus 2: NEGATIVE

## 2020-11-04 LAB — GLUCOSE, CAPILLARY: Glucose-Capillary: 193 mg/dL — ABNORMAL HIGH (ref 70–99)

## 2020-11-04 LAB — HEPATIC FUNCTION PANEL
ALT: 32 U/L (ref 0–44)
AST: 46 U/L — ABNORMAL HIGH (ref 15–41)
Albumin: 3.3 g/dL — ABNORMAL LOW (ref 3.5–5.0)
Alkaline Phosphatase: 38 U/L (ref 38–126)
Bilirubin, Direct: 0.3 mg/dL — ABNORMAL HIGH (ref 0.0–0.2)
Indirect Bilirubin: 1 mg/dL — ABNORMAL HIGH (ref 0.3–0.9)
Total Bilirubin: 1.3 mg/dL — ABNORMAL HIGH (ref 0.3–1.2)
Total Protein: 6.9 g/dL (ref 6.5–8.1)

## 2020-11-04 LAB — HEMOGLOBIN A1C
Hgb A1c MFr Bld: 6.3 % — ABNORMAL HIGH (ref 4.8–5.6)
Mean Plasma Glucose: 134 mg/dL

## 2020-11-04 MED ORDER — HYDRALAZINE HCL 25 MG PO TABS
25.0000 mg | ORAL_TABLET | ORAL | Status: DC | PRN
  Administered 2020-11-04: 25 mg via ORAL
  Filled 2020-11-04: qty 1

## 2020-11-04 MED ORDER — LABETALOL HCL 5 MG/ML IV SOLN: 10.0000 mg | INTRAVENOUS | Status: DC | PRN

## 2020-11-04 MED ORDER — PIRFENIDONE 267 MG PO TABS
3.0000 | ORAL_TABLET | Freq: Three times a day (TID) | ORAL | Status: DC
  Administered 2020-11-04 – 2020-11-06 (×6): 801 mg via ORAL
  Filled 2020-11-04 (×5): qty 3

## 2020-11-04 NOTE — Progress Notes (Signed)
OT Cancellation Note  Patient Details Name: Erica [name] MRN: [id number] DOB: [birth year]   Cancelled Treatment:    Reason Eval/Treat Not Completed: Patient at procedure or test/ unavailable  Metta Clines 11/04/2020, 4:06 PM

## 2020-11-04 NOTE — Progress Notes (Signed)
Received pt from ED, GCS 15, NIH score 1, appears in no apparent distress. Pt conts to endorse numbness/tingling left face/left hand and left leg weakness, speech clear.

## 2020-11-04 NOTE — ED Notes (Signed)
Per Franky Macho, RPH - give Heparin around 0700 and then resume with normal q8 schedule

## 2020-11-04 NOTE — ED Notes (Signed)
[name], sister, requesting someone call her with an update on the patient. [phone]

## 2020-11-04 NOTE — Evaluation (Signed)
Physical Therapy Evaluation Patient Details Name: Erica [name] MRN: [id number] DOB: Oct 26, 1955 Today's Date: 11/04/2020   History of Present Illness  Pt is a 65 y/o female admitted 5/24 secondary to L hand and face numbness and LLE weakness. Found to have R parietoocipital junction infarct. PMH includes CVA, DM, rectal cancer, COPD, MDD, HTN, and CKD.  Clinical Impression  Pt admitted secondary to problem above with deficits below. Pt with LLE weakness and buckling during mobility tasks. Required manual blocking when taking steps at EOB this session. Required mod A for stability when taking steps and min A for transfers and bed mobility. Feel pt will likely benefit from CIR level therapies to increase independence and safety with mobility. Will continue to follow acutely.     Follow Up Recommendations CIR    Equipment Recommendations  Other (comment) (TBD)    Recommendations for Other Services       Precautions / Restrictions Precautions Precautions: Fall Restrictions Weight Bearing Restrictions: No      Mobility  Bed Mobility Overal bed mobility: Needs Assistance Bed Mobility: Supine to Sit;Sit to Supine     Supine to sit: Min assist Sit to supine: Min assist   General bed mobility comments: Min A for trunk elevation to come to sitting. Required min A for LE assist for return to supine.    Transfers Overall transfer level: Needs assistance Equipment used: 1 person hand held assist Transfers: Sit to/from Stand Sit to Stand: Min assist         General transfer comment: Min A for steadying assist to stand.  Ambulation/Gait Ambulation/Gait assistance: Mod assist   Assistive device: 1 person hand held assist       General Gait Details: Pt with increased L knee buckling during stance phase. Required manual blocking of L knee to take steps at EOB. Mod A for steadying.  Stairs            Wheelchair Mobility    Modified Rankin (Stroke Patients  Only) Modified Rankin (Stroke Patients Only) Pre-Morbid Rankin Score: Slight disability Modified Rankin: Moderately severe disability     Balance Overall balance assessment: Needs assistance Sitting-balance support: No upper extremity supported;Feet supported Sitting balance-Leahy Scale: Fair     Standing balance support: Single extremity supported Standing balance-Leahy Scale: Poor Standing balance comment: Reliant on UE and external support                             Pertinent Vitals/Pain Pain Assessment: No/denies pain    Home Living Family/patient expects to be discharged to:: Private residence Living Arrangements: Children Available Help at Discharge: Available 24 hours/day;Family Type of Home: Apartment Home Access: Level entry     Home Layout: One level Home Equipment: Clinical cytogeneticist - 2 wheels;Cane - single point;Bedside commode      Prior Function Level of Independence: Independent with assistive device(s)         Comments: Uses RW for ambulation     Hand Dominance        Extremity/Trunk Assessment   Upper Extremity Assessment Upper Extremity Assessment: Defer to OT evaluation    Lower Extremity Assessment Lower Extremity Assessment: LLE deficits/detail LLE Deficits / Details: Functional weakness noted. Grossly 3/5. Noted buckling with mobility tasks.    Cervical / Trunk Assessment Cervical / Trunk Assessment: Normal  Communication   Communication: No difficulties  Cognition Arousal/Alertness: Awake/alert Behavior During Therapy: WFL for tasks assessed/performed Overall Cognitive Status: No  family/caregiver present to determine baseline cognitive functioning                                        General Comments      Exercises     Assessment/Plan    PT Assessment Patient needs continued PT services  PT Problem List Decreased strength;Decreased balance;Decreased mobility;Decreased knowledge of use of  DME;Decreased knowledge of precautions;Impaired sensation       PT Treatment Interventions DME instruction;Gait training;Stair training;Functional mobility training;Therapeutic exercise;Therapeutic activities;Balance training;Patient/family education;Neuromuscular re-education    PT Goals (Current goals can be found in the Care Plan section)  Acute Rehab PT Goals Patient Stated Goal: to be independent PT Goal Formulation: With patient Time For Goal Achievement: 11/18/20 Potential to Achieve Goals: Good    Frequency Min 4X/week   Barriers to discharge        Co-evaluation               AM-PAC PT "6 Clicks" Mobility  Outcome Measure Help needed turning from your back to your side while in a flat bed without using bedrails?: A Little Help needed moving from lying on your back to sitting on the side of a flat bed without using bedrails?: A Little Help needed moving to and from a bed to a chair (including a wheelchair)?: A Little Help needed standing up from a chair using your arms (e.g., wheelchair or bedside chair)?: A Lot Help needed to walk in hospital room?: A Lot Help needed climbing 3-5 steps with a railing? : Total 6 Click Score: 14    End of Session Equipment Utilized During Treatment: Gait belt Activity Tolerance: Patient tolerated treatment well Patient left: in bed;with call bell/phone within reach (on stretcher in ED) Nurse Communication: Mobility status PT Visit Diagnosis: Unsteadiness on feet (R26.81);Muscle weakness (generalized) (M62.81);Difficulty in walking, not elsewhere classified (R26.2)    Time: 4492-0100 PT Time Calculation (min) (ACUTE ONLY): 16 min   Charges:   PT Evaluation $PT Eval Moderate Complexity: 1 Mod          Reuel Derby, PT, DPT  Acute Rehabilitation Services  Pager: [phone] Office: [phone]   Rudean Hitt 11/04/2020, 3:30 PM

## 2020-11-04 NOTE — Progress Notes (Signed)
STROKE TEAM PROGRESS NOTE   INTERVAL HISTORY  65 y.o. 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  She describes her headaches as a "brain freeze".  While she was working with her home health therapist she had left sided weakness.  She was asked to go to the ED for concern for a stroke.      Vitals:   11/04/20 1000 11/04/20 1030 11/04/20 1130 11/04/20 1200  BP: (!) 175/87 (!) 169/86 (!) 168/83 (!) 179/76  Pulse: 76 70 63 (!) 47  Resp: 20 20 (!) 22 15  Temp:    98.1 F (36.7 C)  TempSrc:    Oral  SpO2: 100% 100% 100% 99%  Weight:      Height:       CBC:  Recent Labs  Lab 11/03/20 1632 11/04/20 0418  WBC 5.1 5.0  NEUTROABS 3.4  --   HGB 10.5* 9.2*  HCT 29.8* 26.2*  MCV 92.5 93.6  PLT 147* PLATELET CLUMPS NOTED ON SMEAR, UNABLE TO ESTIMATE   Basic Metabolic Panel:  Recent Labs  Lab 11/03/20 1632 11/04/20 0418  NA 140 141  K 4.0 3.5  CL 113* 113*  CO2 21* 21*  GLUCOSE 98 100*  BUN 18 19  CREATININE 1.80* 1.70*  CALCIUM 9.2 8.8*   Lipid Panel:  Recent Labs  Lab 11/04/20 0418  CHOL 131  TRIG 87  HDL 58  CHOLHDL 2.3  VLDL 17  LDLCALC 56   HgbA1c: No results for input(s): HGBA1C in the last 168 hours. Urine Drug Screen: No results for input(s): LABOPIA, COCAINSCRNUR, LABBENZ, AMPHETMU, THCU, LABBARB in the last 168 hours.  Alcohol Level No results for input(s): ETH in the last 168 hours.  IMAGING   DG Chest 2 View Result Date: 11/03/2020 IMPRESSION: No acute disease. Pulmonary fibrosis.   MR ANGIO HEAD WO CONTRAST Result Date: 11/04/2020 IMPRESSION:  1. Negative MRA for large vessel occlusion.  2. Atheromatous irregularity involving the PCAs bilaterally with superimposed moderate left P2  stenosis, mildly progressed from previous.  3. No other hemodynamically significant or correctable stenosis about the major intracranial arterial vasculature.  MR Brain Wo Contrast (neuro protocol) Result Date: 11/03/2020 IMPRESSION: Newly seen punctate acute infarction at the medial right parietooccipital junction. No other change since the most recent exam of January. Chronic small-vessel ischemic changes of the pons, cerebellum, thalami, basal ganglia and hemispheric white matter. Old cortical and subcortical infarctions in the occipital regions and frontoparietal regions.   MR CERVICAL SPINE WO CONTRAST Result Date: 11/04/2020 IMPRESSION:  1. Persistent patchy signal abnormality involving the central/dorsal aspect of the cervical spinal cord, extending from C2 through C7-T1, relatively stable and unchanged from previous. 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 previous myelitis). The underlying cord is diffusely atrophic, also stable.  2. Slight interval regression of right paracentral disc extrusion at C7-T1, with persistent mild spinal stenosis and right-sided cord flattening at this level. 3. Otherwise stable multilevel cervical spondylosis with resultant mild spinal stenosis at C5-6 and C6-7.    PHYSICAL EXAM General: Well-developed, well-nourished Caucasian female lying comfortably in bed, NAD HEENT: PERRLA, MMM Cardiovascular: Normal rate and regular rhythm Respiratory: No  respiratory distress, breathing on room air comfortably GI: Soft, nondistended, no rigidity no tenderness Extremities: No peripheral edema  Neurological: Mental Status: Alert, oriented, thought content appropriate.  Speech fluent without evidence of aphasia.  Able to follow 3 step commands without difficulty. Cranial Nerves: II:  Visual fields grossly normal, pupils equal,  round, reactive to light and accommodation III,IV, VI: ptosis not present, extra-ocular motions intact bilaterally V,VII: smile symmetric, reduced facial light touch sensation on left. VIII: hearing normal bilaterally IX,X: uvula rises symmetrically XI: bilateral shoulder shrug XII: midline tongue extension without atrophy or fasciculations  Motor: Right :  Upper extremity   5/5                                      Left:     Upper extremity   4/5             Lower extremity   5/5                                                  Lower extremity   4/5 Tone and bulk:normal tone throughout Sensory: Reduced light touch sensation on left side Cerebellar: normal finger-to-nose,  normal heel-to-shin test Gait: deferred  ASSESSMENT/PLAN [name] is a 65 y.o. 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  Stroke:  right parietooccipital stroke likely due to small vessel disease.  Cannot rule out embolic source give recent right caudate head and right anterior lateral thalamic infarcts   MRI  punctate acute infarction at the medial right parietooccipital junction.  MRA  Negative for LVO, irregularity involving the PCAs bilaterally with superimposed moderate left P2 stenosis  Carotid Doppler  pending  2D Echo pending  Loop recorder prior to discharge, EP team made aware  LDL 56  HgbA1c 5.2  VTE prophylaxis - heparin sq    Diet   Diet Heart Room service appropriate? Yes; Fluid consistency: Thin    Plavix prior to admission, now on aspirin 81 mg daily and clopidogrel 75 mg daily.  DAPT with Aspirin and Plavix for 3 weeks then aspirin alone  Therapy recommendations:  pending  Disposition:  pending  Prior  strokes  06/25/2020 right caudate head and right anterior lateral thalamic infarcts, likely secondary to small vessel diseaset.  Placed on DAPT for 3 weeks and Plavix alone.  LDL 86 -increase atorvastatin from 20 mg to 40 mg daily and ongoing use of fenofibrate and Vascepa.  Residual deficits of left hemiparesis and left hemisensory impairment.  Discharged to CIR. Placed on 14-day mobile cardiac amatory telemetry to evaluate for possible atrial fibrillation   Hypertension  Home meds:  Amlodipine 58m daily, hydralazine 37.5 q 8hr, hyzaar 50-12.568mdaily, Toprol-XL 5041maily  Stable . Permissive hypertension (OK if < 220/120) but gradually normalize in 5-7 days . Long-term BP goal normotensive  Hyperlipidemia  Home meds:  crestor 34m73mily, resumed in hospital  LDL 56, goal < 70  Continue statin at discharge  Diabetes type II Controlled  Home meds:  Toujeo solostar 300 12 units bid  HgbA1c 5.2, goal < 7.0  CBGs Recent Labs    11/03/20 2324 11/04/20 1247  GLUCAP 117* 87      SSI  lantus 25 units bid  Other Stroke Risk Factors  Advanced Age >/= 79   Cigarette smoker, former, quit 01/05/2018  Obesity, Body mass index is 32.52 kg/m., BMI >/= 30 associated with increased stroke risk, recommend weight loss, diet and exercise as appropriate   Family hx stroke (father)  CAD: Coronary artery calcification:  Other Active Problems  Anxiety/depression: Effexor-XR 53m daily  Asthma: Dulera inhaler  GERD  Neuropathy feet: neurontin 2034mbid prn  arthritis  Hospital day # 0  Lissy Olivencia-Simmons, ACNP-BC Stroke NP 11/04/2020 at 2:30PM I have personally obtained history,examined this patient, reviewed notes, independently viewed imaging studies, participated in medical decision making and plan of care.ROS completed by me personally and pertinent positives fully documented  I have made any additions or clarifications directly to the above note. Agree with note  above.  She presented with left face and arm numbness likely due to right parieto-occipital small infarct.  Recommend aspirin Plavix for 3 weeks followed by aspirin alone and aggressive risk factor modification.  Check echocardiogram.  Recommend loop recorder prior to discharge for paroxysmal A. fib.  Long discussion with patient and answered questions.  Greater than 50% time during this 35-minute visit was spent on counseling and coordination of care about her stroke and discussion about stroke prevention and treatment.  PrAntony ContrasMD Medical Director [facility] Pager: [phone]/25/2022 2:28 PM  To contact Stroke Continuity provider, please refer to [email or web address], contact General Neurology

## 2020-11-04 NOTE — Progress Notes (Signed)
Progress Note    Erica [name]   TKW:[id number]  DOB: [birth year]  DOA: 11/03/2020     0  PCP: Nolene Ebbs, MD  CC: left hand and facial numbness, LLE weakness  Hospital Course: Erica [name] is a 65 y.o. female with medical history significant for recent CVA (Jan 2022), COPD, pulmonary fibrosis, coronary artery disease, depression, rectal cancer, CKD IIIb, diabetes mellitus, history of right thalamic infarction in January 2022, and hydrosyringomyelia of the cervical spine, who presented to the ER with 3 days of left hand and facial numbness, and left leg weakness.  She has also been experiencing headache (that she describes as brain freeze) on the right side for approximately 4 weeks. Headache has been intermittent, occurring a couple times a day, and attributed by the patient to anxiety. Headache has been relieved with acetaminophen.  The left hand and facial numbness have been present for the past 3 days.  Upon arrival to the ED, patient is found to be afebrile, saturating well on room air, bradycardic in the upper 40s to 50s, and with blood pressure 180/80.  EKG features sinus bradycardia with rate 47.  Chest x-ray negative for acute cardiopulmonary disease.  MRI brain demonstrates a new acute infarction at the medial right parietooccipital junction in addition to chronic small vessel ischemic changes.   Interval History:  Seen in the ER this afternoon still awaiting a room.  No major changes in neuro deficits as noted on admission.  ROS: Constitutional: negative for chills and fevers, Respiratory: negative for cough, Cardiovascular: negative for chest pain and Gastrointestinal: negative for abdominal pain  Assessment & Plan:  Acute CVA - Presents with 3 days of left hand and face tingling and mild LLE weakness and is found to have acute infarct in right parietooccipital junction  -Follow-up echo and carotid Doppler - Loop recorder recommended prior to discharge per neurology -  Continue aspirin and Plavix for 3 weeks followed by monotherapy aspirin -Continue statin - follow up PT/OT consults  Hydrosyringomyelia  -MRI C-spine obtained on admission.  Shows stable and unchanged patchy persistent signal abnormality extending from C2 through C7-T1  Hypertension  - Continue permissive hypertension, will start to slowly bring down  CKD IIIb  - SCr is 1.80 on admission, up from 1.53 in February 2022  - Renally-dose medications, s/p IVF overnight  COPD; IPF  - No cough or wheezing on admission, continue pirfenidone, ICS/LABA, Spiriva, and as-needed albuterol    Depression  - Stable, continue home regimen   Sinus bradycardia  - Rate upper 40s to 50s in ED  - Hold metoprolol, continue cardiac monitoring   Type II DM  - A1c was only 5.2% in January  - Continue CBG checks and long-acting insulin, use low-intensity SSI if needed     Old records reviewed in assessment of this patient  Antimicrobials:   DVT prophylaxis: heparin injection 5,000 Units Start: 11/03/20 2200   Code Status:   Code Status: Full Code Family Communication:   Disposition Plan: Status is: Observation  The patient will require care spanning > 2 midnights and should be moved to inpatient because: Ongoing diagnostic testing needed not appropriate for outpatient work up and need for loop recorder prior to discharge and PT/OT assessments  Dispo: The patient is from: Home              Anticipated d/c is to: pending PT/OT evals              Patient currently  is not medically stable to d/c.   Difficult to place patient No   Risk of unplanned readmission score:     Objective: Blood pressure (!) 179/76, pulse (!) 47, temperature 98.1 F (36.7 C), temperature source Oral, resp. rate 15, height _0  (1.6 m), weight 83.3 kg, SpO2 99 %.  Examination: General appearance: alert, cooperative and no distress Head: Normocephalic, without obvious abnormality, atraumatic Eyes:  EOMI Lungs: clear to auscultation bilaterally Heart: regular rate and rhythm and S1, S2 normal Abdomen: Soft, nontender, nondistended, bowel sounds present Extremities: No edema Skin: mobility and turgor normal Neurologic: 4/5 strength appreciated in left upper and lower extremity.  Subjective paresthesia in left lower and left upper extremity as well as left V2, V3 facial distributions  Consultants:   Neurology  Procedures:     Data Reviewed: I have personally reviewed following labs and imaging studies Results for orders placed or performed during the hospital encounter of 11/03/20 (from the past 24 hour(s))  Comprehensive metabolic panel     Status: Abnormal   Collection Time: 11/03/20  4:32 PM  Result Value Ref Range   Sodium 140 135 - 145 mmol/L   Potassium 4.0 3.5 - 5.1 mmol/L   Chloride 113 (H) 98 - 111 mmol/L   CO2 21 (L) 22 - 32 mmol/L   Glucose, Bld 98 70 - 99 mg/dL   BUN 18 8 - 23 mg/dL   Creatinine, Ser 1.80 (H) 0.44 - 1.00 mg/dL   Calcium 9.2 8.9 - 10.3 mg/dL   Total Protein 6.9 6.5 - 8.1 g/dL   Albumin 3.4 (L) 3.5 - 5.0 g/dL   AST 44 (H) 15 - 41 U/L   ALT 33 0 - 44 U/L   Alkaline Phosphatase 45 38 - 126 U/L   Total Bilirubin 1.3 (H) 0.3 - 1.2 mg/dL   GFR, Estimated 31 (L) >60 mL/min   Anion gap 6 5 - 15  CBC with Differential     Status: Abnormal   Collection Time: 11/03/20  4:32 PM  Result Value Ref Range   WBC 5.1 4.0 - 10.5 K/uL   RBC 3.22 (L) 3.87 - 5.11 MIL/uL   Hemoglobin 10.5 (L) 12.0 - 15.0 g/dL   HCT 29.8 (L) 36.0 - 46.0 %   MCV 92.5 80.0 - 100.0 fL   MCH 32.6 26.0 - 34.0 pg   MCHC 35.2 30.0 - 36.0 g/dL   RDW 16.3 (H) 11.5 - 15.5 %   Platelets 147 (L) 150 - 400 K/uL   nRBC 0.0 0.0 - 0.2 %   Neutrophils Relative % 66 %   Neutro Abs 3.4 1.7 - 7.7 K/uL   Lymphocytes Relative 25 %   Lymphs Abs 1.3 0.7 - 4.0 K/uL   Monocytes Relative 6 %   Monocytes Absolute 0.3 0.1 - 1.0 K/uL   Eosinophils Relative 2 %   Eosinophils Absolute 0.1 0.0 - 0.5  K/uL   Basophils Relative 0 %   Basophils Absolute 0.0 0.0 - 0.1 K/uL   Immature Granulocytes 1 %   Abs Immature Granulocytes 0.03 0.00 - 0.07 K/uL  Urinalysis, Routine w reflex microscopic     Status: Abnormal   Collection Time: 11/03/20  4:38 PM  Result Value Ref Range   Color, Urine YELLOW YELLOW   APPearance CLOUDY (A) CLEAR   Specific Gravity, Urine 1.011 1.005 - 1.030   pH 5.0 5.0 - 8.0   Glucose, UA NEGATIVE NEGATIVE mg/dL   Hgb urine dipstick NEGATIVE NEGATIVE  Bilirubin Urine NEGATIVE NEGATIVE   Ketones, ur NEGATIVE NEGATIVE mg/dL   Protein, ur 30 (A) NEGATIVE mg/dL   Nitrite NEGATIVE NEGATIVE   Leukocytes,Ua LARGE (A) NEGATIVE   RBC / HPF 0-5 0 - 5 RBC/hpf   WBC, UA >50 (H) 0 - 5 WBC/hpf   Bacteria, UA MANY (A) NONE SEEN   Squamous Epithelial / LPF 0-5 0 - 5   WBC Clumps PRESENT    Mucus PRESENT   CBG monitoring, ED     Status: Abnormal   Collection Time: 11/03/20 11:24 PM  Result Value Ref Range   Glucose-Capillary 117 (H) 70 - 99 mg/dL  SARS CORONAVIRUS 2 (TAT 6-24 HRS) Nasopharyngeal Nasopharyngeal Swab     Status: None   Collection Time: 11/04/20 12:57 AM   Specimen: Nasopharyngeal Swab  Result Value Ref Range   SARS Coronavirus 2 NEGATIVE NEGATIVE  Basic metabolic panel     Status: Abnormal   Collection Time: 11/04/20  4:18 AM  Result Value Ref Range   Sodium 141 135 - 145 mmol/L   Potassium 3.5 3.5 - 5.1 mmol/L   Chloride 113 (H) 98 - 111 mmol/L   CO2 21 (L) 22 - 32 mmol/L   Glucose, Bld 100 (H) 70 - 99 mg/dL   BUN 19 8 - 23 mg/dL   Creatinine, Ser 1.70 (H) 0.44 - 1.00 mg/dL   Calcium 8.8 (L) 8.9 - 10.3 mg/dL   GFR, Estimated 33 (L) >60 mL/min   Anion gap 7 5 - 15  CBC     Status: Abnormal   Collection Time: 11/04/20  4:18 AM  Result Value Ref Range   WBC 5.0 4.0 - 10.5 K/uL   RBC 2.80 (L) 3.87 - 5.11 MIL/uL   Hemoglobin 9.2 (L) 12.0 - 15.0 g/dL   HCT 26.2 (L) 36.0 - 46.0 %   MCV 93.6 80.0 - 100.0 fL   MCH 32.9 26.0 - 34.0 pg   MCHC 35.1 30.0 -  36.0 g/dL   RDW 16.0 (H) 11.5 - 15.5 %   Platelets PLATELET CLUMPS NOTED ON SMEAR, UNABLE TO ESTIMATE 150 - 400 K/uL   nRBC 0.0 0.0 - 0.2 %  Hepatic function panel     Status: Abnormal   Collection Time: 11/04/20  4:18 AM  Result Value Ref Range   Total Protein 6.9 6.5 - 8.1 g/dL   Albumin 3.3 (L) 3.5 - 5.0 g/dL   AST 46 (H) 15 - 41 U/L   ALT 32 0 - 44 U/L   Alkaline Phosphatase 38 38 - 126 U/L   Total Bilirubin 1.3 (H) 0.3 - 1.2 mg/dL   Bilirubin, Direct 0.3 (H) 0.0 - 0.2 mg/dL   Indirect Bilirubin 1.0 (H) 0.3 - 0.9 mg/dL  Lipid panel     Status: None   Collection Time: 11/04/20  4:18 AM  Result Value Ref Range   Cholesterol 131 0 - 200 mg/dL   Triglycerides 87 <150 mg/dL   HDL 58 >40 mg/dL   Total CHOL/HDL Ratio 2.3 RATIO   VLDL 17 0 - 40 mg/dL   LDL Cholesterol 56 0 - 99 mg/dL  CBG monitoring, ED     Status: None   Collection Time: 11/04/20 12:47 PM  Result Value Ref Range   Glucose-Capillary 87 70 - 99 mg/dL    Recent Results (from the past 240 hour(s))  SARS CORONAVIRUS 2 (TAT 6-24 HRS) Nasopharyngeal Nasopharyngeal Swab     Status: None   Collection Time: 11/04/20 12:57 AM  Specimen: Nasopharyngeal Swab  Result Value Ref Range Status   SARS Coronavirus 2 NEGATIVE NEGATIVE Final    Comment: (NOTE) SARS-CoV-2 target nucleic acids are NOT DETECTED.  The SARS-CoV-2 RNA is generally detectable in upper and lower respiratory specimens during the acute phase of infection. Negative results do not preclude SARS-CoV-2 infection, do not rule out co-infections with other pathogens, and should not be used as the sole basis for treatment or other patient management decisions. Negative results must be combined with clinical observations, patient history, and epidemiological information. The expected result is Negative.  Fact Sheet for Patients: SugarRoll.be  Fact Sheet for Healthcare Providers: https://www.woods-mathews.com/  This  test is not yet approved or cleared by the [place] FDA and  has been authorized for detection and/or diagnosis of SARS-CoV-2 by FDA under an Emergency Use Authorization (EUA). This EUA will remain  in effect (meaning this test can be used) for the duration of the COVID-19 declaration under Se ction 564(b)(1) of the Act, 21 U.S.C. section 360bbb-3(b)(1), unless the authorization is terminated or revoked sooner.  Performed at [facility], [place] [address] [place], [place] [address]      Radiology Studies: DG Chest 2 View  Result Date: 11/03/2020 CLINICAL DATA:  Left-sided weakness for 3 days. EXAM: CHEST - 2 VIEW COMPARISON:  PA and lateral chest 03/30/2020.  CT chest 04/12/2019. FINDINGS: Extensive pulmonary fibrosis is identified as seen on the prior exams. No consolidative process, pneumothorax or effusion. Heart size is normal. No acute or focal bony abnormality. IMPRESSION: No acute disease. Pulmonary fibrosis. Electronically Signed   By: Inge Rise M.D.   On: 11/03/2020 17:38   MR ANGIO HEAD WO CONTRAST  Result Date: 11/04/2020 CLINICAL DATA:  Follow-up examination for acute stroke. EXAM: MRA HEAD WITHOUT CONTRAST TECHNIQUE: Angiographic images of the Circle of Willis were acquired using MRA technique without intravenous contrast. COMPARISON:  Previous MRI from 11/03/2020 and MRA from 06/25/2020. FINDINGS: ANTERIOR CIRCULATION: Visualized distal cervical segments of the internal carotid arteries are patent with antegrade flow. Petrous segments widely patent bilaterally. Mild atheromatous irregularity throughout the carotid siphons without hemodynamically significant stenosis. A1 segments patent bilaterally. Normal anterior communicating artery complex. Anterior cerebral arteries patent to their distal aspects without stenosis. No M1 stenosis or occlusion. Normal MCA bifurcations. Distal MCA branches remain well perfused and symmetric. POSTERIOR CIRCULATION: Both vertebral  arteries widely patent to the vertebrobasilar junction without stenosis. Both PICA origins patent and normal. 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 Basilar widely patent distally without stenosis. Superior cerebellar arteries patent bilaterally. Both PCAs primarily supplied via the basilar. Mild atheromatous irregularity throughout the PCAs bilaterally without large vessel occlusion. Short-segment moderate left P2 stenosis, mildly progressed from previous (series 1029, image 8). No intracranial aneurysm. IMPRESSION: 1. Negative MRA for large vessel occlusion. 2. Atheromatous irregularity involving the PCAs bilaterally with superimposed moderate left P2 stenosis, mildly progressed from previous. 3. No other hemodynamically significant or correctable stenosis about the major intracranial arterial vasculature. Electronically Signed   By: Jeannine Boga M.D.   On: 11/04/2020 01:44   MR Brain Wo Contrast (neuro protocol)  Result Date: 11/03/2020 CLINICAL DATA:  Worsening of left arm and leg numbness. Acute infarction in January of this year affecting the right thalamus. EXAM: MRI HEAD WITHOUT CONTRAST TECHNIQUE: Multiplanar, multiecho pulse sequences of the brain and surrounding structures were obtained without intravenous contrast. COMPARISON:  06/25/2010 FINDINGS: Brain: New punctate acute infarction at the right medial parietooccipital junction region. No  other acute finding. Chronic small-vessel ischemic changes affect the pons. Few old small vessel cerebellar infarctions. Old small vessel infarctions in the right thalamus, the left thalamus, the basal ganglia and cerebral hemispheric white matter. Scattered old cortical and subcortical infarctions in both occipital lobes and both frontoparietal regions. No mass, hemorrhage, hydrocephalus or extra-axial collection. Vascular: Major vessels at the base of the brain show flow. Skull and upper cervical spine: Negative  Sinuses/Orbits: Clear/normal Other: None IMPRESSION: Newly seen punctate acute infarction at the medial right parietooccipital junction. No other change since the most recent exam of January. Chronic small-vessel ischemic changes of the pons, cerebellum, thalami, basal ganglia and hemispheric white matter. Old cortical and subcortical infarctions in the occipital regions and frontoparietal regions. Electronically Signed   By: Nelson Chimes M.D.   On: 11/03/2020 19:14   MR CERVICAL SPINE WO CONTRAST  Result Date: 11/04/2020 CLINICAL DATA:  Initial evaluation for spinal stenosis. EXAM: MRI CERVICAL SPINE WITHOUT CONTRAST TECHNIQUE: Multiplanar, multisequence MR imaging of the cervical spine was performed. No intravenous contrast was administered. COMPARISON:  Prior MRI from 06/26/2020. FINDINGS: Alignment: Smooth reversal of the normal cervical lordosis with trace stepwise anterolisthesis of C2 on C3 through C4 on C5, stable from previous. Vertebrae: Vertebral body height maintained without acute or interval fracture. Bone marrow signal intensity within normal limits. No discrete or worrisome osseous lesions. Discogenic reactive endplate change noted about the C6-7 interspace. No abnormal marrow edema. Cord: Again seen is patchy signal abnormality involving the central/dorsal aspect of the cervical spinal cord, extending from the level of C2 through C7-T1. Signal change measures up to 3 mm in maximal diameter at the level of C2-3. Overall appearance is relatively unchanged from prior. 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 The underlying cord is again noted to be diffusely atrophic, also stable. Posterior Fossa, vertebral arteries, paraspinal tissues: Atrophy with chronic microvascular ischemic changes noted within the visualized brain. Craniocervical junction within normal  limits. Paraspinous and prevertebral soft tissues within normal limits. Normal flow voids seen within the vertebral arteries bilaterally. Disc levels: C2-C3: Trace anterolisthesis. Tiny central disc protrusion minimally indents the ventral thecal sac. No spinal stenosis. Foramina are widely patent. C3-C4: Trace anterolisthesis. Shallow central disc protrusion minimally indents the ventral thecal sac. Left-sided facet degeneration. No spinal stenosis. Foramina remain widely patent. C4-C5: Trace anterolisthesis. Small central disc protrusion indents the ventral thecal sac. Mild flattening of the ventral cord without significant spinal stenosis. Foramina remain widely patent. C5-C6: Degenerative intervertebral disc space narrowing. Broad-based central disc osteophyte complex indents the ventral thecal sac, asymmetric to the left. Mild flattening of the ventral cord with resultant mild spinal stenosis. Foramina remain patent. Appearance is stable. C6-C7: Degenerative intervertebral disc space narrowing. Broad-based central disc osteophyte complex indents the ventral thecal sac, contacting and flattening the ventral spinal cord. Mild spinal stenosis. Foramina remain patent. Appearance is stable. C7-T1: Right paracentral disc extrusion with both superior and inferior migration, slightly decreased in size and regressed as compared to previous. Disc material continues to contact the right ventral cord with mild cord flattening (series 9, image 27). Persistent mild spinal stenosis. Superimposed mild facet and ligament flavum hypertrophy. Foramina remain patent. T1-2: Normal interspace. Mild left greater than right facet hypertrophy. No stenosis. T2-3: Small right paracentral disc extrusion mildly indents the right ventral thecal sac. Slight superior migration of disc material. No significant spinal stenosis. Foramina remain patent.  IMPRESSION: 1. Persistent patchy signal abnormality involving the central/dorsal aspect of the  cervical spinal cord, extending from C2 through C7-T1, relatively stable and unchanged from previous. 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 previous myelitis). The underlying cord is diffusely atrophic, also stable. 2. Slight interval regression of right paracentral disc extrusion at C7-T1, with persistent mild spinal stenosis and right-sided cord flattening at this level. 3. Otherwise stable multilevel cervical spondylosis with resultant mild spinal stenosis at C5-6 and C6-7. Electronically Signed   By: Jeannine Boga M.D.   On: 11/04/2020 01:18   MR CERVICAL SPINE WO CONTRAST  Final Result    MR ANGIO HEAD WO CONTRAST  Final Result    MR Brain Wo Contrast (neuro protocol)  Final Result    DG Chest 2 View  Final Result    VAS US CAROTID (at [facility] and [facility] only)    (Results Pending)    Scheduled Meds: .  stroke: mapping our early stages of recovery book   Does not apply Once  . aspirin EC  81 mg Oral Daily  . busPIRone  10 mg Oral BID  . clopidogrel  75 mg Oral Daily  . famotidine  20 mg Oral Daily  . heparin  5,000 Units Subcutaneous Q8H  . insulin aspart  0-5 Units Subcutaneous QHS  . insulin aspart  0-9 Units Subcutaneous TID WC  . insulin glargine  25 Units Subcutaneous BID  . mometasone-formoterol  2 puff Inhalation BID  . Pirfenidone  3 tablet Oral TID  . rosuvastatin  20 mg Oral Daily  . traZODone  50 mg Oral QHS  . umeclidinium bromide  1 puff Inhalation Daily  . venlafaxine XR  75 mg Oral Q breakfast   PRN Meds: acetaminophen **OR** acetaminophen (TYLENOL) oral liquid 160 mg/5 mL **OR** acetaminophen, albuterol, diclofenac Sodium, gabapentin, senna-docusate Continuous Infusions:   LOS: 0 days  Time spent: Greater than 50% of the 35 minute visit was spent in counseling/coordination of care for the patient as laid out in the A&P.    Dwyane Dee, MD [facility] 11/04/2020, 2:58 PM

## 2020-11-04 NOTE — ED Notes (Signed)
Pt remains in MRI at this time

## 2020-11-04 NOTE — Progress Notes (Signed)
*  PRELIMINARY RESULTS* Echocardiogram 2D Echocardiogram has been performed.  Luisa Hart RDCS 11/04/2020, 1:13 PM

## 2020-11-04 NOTE — ED Notes (Signed)
Pt had accident and urinated on herself. Pt cleaned up, bed linen changed, pt placed in brief, and pt now on purewick.

## 2020-11-04 NOTE — Progress Notes (Signed)
Rehab Admissions Coordinator Note:  Patient was screened by Cleatrice Burke for appropriateness for an Inpatient Acute Rehab Consult per therapy recs. .  At this time, we are recommending Inpatient Rehab consult. I will place order per protocol.  Cleatrice Burke RN MSN 11/04/2020, 7:01 PM  I can be reached at [phone].

## 2020-11-05 ENCOUNTER — Inpatient Hospital Stay (HOSPITAL_COMMUNITY): Payer: Medicare HMO

## 2020-11-05 DIAGNOSIS — I10 Essential (primary) hypertension: Secondary | ICD-10-CM | POA: Diagnosis not present

## 2020-11-05 DIAGNOSIS — I639 Cerebral infarction, unspecified: Secondary | ICD-10-CM

## 2020-11-05 LAB — CBC WITH DIFFERENTIAL/PLATELET
Abs Immature Granulocytes: 0.02 10*3/uL (ref 0.00–0.07)
Basophils Absolute: 0 10*3/uL (ref 0.0–0.1)
Basophils Relative: 0 %
Eosinophils Absolute: 0.1 10*3/uL (ref 0.0–0.5)
Eosinophils Relative: 2 %
HCT: 25.7 % — ABNORMAL LOW (ref 36.0–46.0)
Hemoglobin: 9.2 g/dL — ABNORMAL LOW (ref 12.0–15.0)
Immature Granulocytes: 1 %
Lymphocytes Relative: 31 %
Lymphs Abs: 1.1 10*3/uL (ref 0.7–4.0)
MCH: 32.5 pg (ref 26.0–34.0)
MCHC: 35.8 g/dL (ref 30.0–36.0)
MCV: 90.8 fL (ref 80.0–100.0)
Monocytes Absolute: 0.3 10*3/uL (ref 0.1–1.0)
Monocytes Relative: 8 %
Neutro Abs: 2.1 10*3/uL (ref 1.7–7.7)
Neutrophils Relative %: 58 %
Platelets: 140 10*3/uL — ABNORMAL LOW (ref 150–400)
RBC: 2.83 MIL/uL — ABNORMAL LOW (ref 3.87–5.11)
RDW: 15.9 % — ABNORMAL HIGH (ref 11.5–15.5)
WBC: 3.5 10*3/uL — ABNORMAL LOW (ref 4.0–10.5)
nRBC: 0 % (ref 0.0–0.2)

## 2020-11-05 LAB — BASIC METABOLIC PANEL
Anion gap: 7 (ref 5–15)
BUN: 17 mg/dL (ref 8–23)
CO2: 20 mmol/L — ABNORMAL LOW (ref 22–32)
Calcium: 8.7 mg/dL — ABNORMAL LOW (ref 8.9–10.3)
Chloride: 113 mmol/L — ABNORMAL HIGH (ref 98–111)
Creatinine, Ser: 1.63 mg/dL — ABNORMAL HIGH (ref 0.44–1.00)
GFR, Estimated: 35 mL/min — ABNORMAL LOW (ref >60)
Glucose, Bld: 98 mg/dL (ref 70–99)
Potassium: 3.1 mmol/L — ABNORMAL LOW (ref 3.5–5.1)
Sodium: 140 mmol/L (ref 135–145)

## 2020-11-05 LAB — GLUCOSE, CAPILLARY
Glucose-Capillary: 88 mg/dL (ref 70–99)
Glucose-Capillary: 183 mg/dL — ABNORMAL HIGH (ref 70–99)
Glucose-Capillary: 194 mg/dL — ABNORMAL HIGH (ref 70–99)
Glucose-Capillary: 127 mg/dL — ABNORMAL HIGH (ref 70–99)

## 2020-11-05 LAB — FOLATE: Folate: 38.9 ng/mL (ref >5.9)

## 2020-11-05 LAB — MAGNESIUM: Magnesium: 1.7 mg/dL (ref 1.7–2.4)

## 2020-11-05 LAB — VITAMIN B12: Vitamin B-12: 737 pg/mL (ref 180–914)

## 2020-11-05 MED ORDER — POTASSIUM CHLORIDE CRYS ER 20 MEQ PO TBCR
40.0000 meq | EXTENDED_RELEASE_TABLET | Freq: Once | ORAL | Status: AC
  Administered 2020-11-05: 40 meq via ORAL
  Filled 2020-11-05: qty 2

## 2020-11-05 MED ORDER — LOSARTAN POTASSIUM 50 MG PO TABS
50.0000 mg | ORAL_TABLET | Freq: Every day | ORAL | Status: DC
  Administered 2020-11-05 – 2020-11-06 (×2): 50 mg via ORAL
  Filled 2020-11-05 (×2): qty 1

## 2020-11-05 NOTE — PMR Pre-admission (Signed)
PMR Admission Coordinator Pre-Admission Assessment  Patient: Erica [name] is an 65 y.o., female MRN: [id number] DOB: February 28, 1956 Height: _0  (160 cm) Weight: 83.3 kg              Insurance Information  PRIMARY: [employer]      Policy#: [id number]      Subscriber: pt CM Name: Erica [name]     Phone#: [phone] ext [phone]     Fax#: [phone] Pre-Cert#: [id number] approved for 7 days with f/u Loraine ext [phone] same fax     Benefits:  Phone #: [phone]     Name: 5/26 Eff. Date: 06/13/2016     Deduct: $233      Out of Pocket Max: $3450      Life Max: none  CIR: $2524 per admit dual medicaid policy      SNF: no copay days 1 until 20; $188 co pay per day days 24 until 41; no copay days 42 until 100 Outpatient: 80%     Co-Pay: visits per medical neccesity Home Health: 100%      Co-Pay: visits per medical neccesity DME: 80%     Co-Pay: 205 Providers: in network  SECONDARY: Medicaid [place] Access      Policy#: [id number] L 2/70 [place]  Financial Counselor:       Phone#:   The "Data Collection Information Summary" for patients in Inpatient Rehabilitation Facilities with attached "Privacy Act [place] Records" was provided and verbally reviewed with: Patient  Emergency Contact Information Contact Information    Name Relation Home Work Mobile   [place] Sister [phone]     [name], [name] [phone]       Current Medical History  Patient Admitting Diagnosis: CVA  History of Present Illness:  HPI: Erica [name] is a 65 y.o. 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    Presented 11/03/2020 with persistent headache and left-sided weakness x3 days. MRI shows punctuate acute infarction at the medial right parieto-occipital junction.  Chronic small vessel ischemic changes.  MRA  of the head negative for large vessel occlusion.  MRI cervical spine with persistent patchy signal abnormality involving the central/dorsal aspect of the cervical spinal cord extending from C2-C7-T1 relatively stable and unchanged from previous tracings.  Admission chemistries unremarkable except chloride 113 glucose 100, creatinine 1.70, hemoglobin 9.2, hemoglobin A1c 6.3.  Echocardiogram with ejection fraction of 60 to 93% grade 1 diastolic dysfunction.  Currently maintained on aspirin 81 mg daily and Plavix 75 mg daily for CVA prophylaxis x3 weeks then aspirin alone.  Subcutaneous heparin for DVT prophylaxis. Plan for loop recorder to follow up in outpatient setting per patient request.   Complete NIHSS TOTAL: 0 Glasgow Coma Scale Score: 15  Past Medical History  Past Medical History:  Diagnosis Date  . Arthritis    especially in shoulders  . Asthma   . Depression   . Diabetes mellitus   . GERD (gastroesophageal reflux disease)   . Headache(784.0)    "mild"  . Hypertension   . Mental disorder    depression  . Neuropathy    feet   . Pain    arthritis pain - takes tramadol as needed  . Rectal polyp    very little bleeding with bowel movements- no pain  . Suicide attempt [facility])    Family History  family history includes Cancer in her  maternal aunt, maternal uncle, maternal uncle, maternal uncle, paternal aunt, and paternal uncle; Cancer (age of onset: 72) in an other family member; Diabetes in her father; Heart disease in her mother; Hyperlipidemia in her mother; Hypertension in her brother, brother, father, mother, sister, and sister; Stroke in her father.  Prior Rehab/Hospitalizations:  Has the patient had prior rehab or hospitalizations prior to admission? Yes CIR 06/2020  Has the patient had major surgery during 100 days prior to admission? yes  Current Medications   Current Facility-Administered Medications:  .   stroke: mapping our early stages of recovery book, , Does not  apply, Once, Opyd, Ilene Qua, MD .  acetaminophen (TYLENOL) tablet 650 mg, 650 mg, Oral, Q4H PRN, 650 mg at 11/05/20 1224 **OR** acetaminophen (TYLENOL) 160 MG/5ML solution 650 mg, 650 mg, Per Tube, Q4H PRN **OR** acetaminophen (TYLENOL) suppository 650 mg, 650 mg, Rectal, Q4H PRN, Opyd, Timothy S, MD .  albuterol (VENTOLIN HFA) 108 (90 Base) MCG/ACT inhaler 2 puff, 2 puff, Inhalation, Q4H PRN, Opyd, Timothy S, MD .  amLODipine (NORVASC) tablet 5 mg, 5 mg, Oral, Daily, Cyndia Skeeters, Taye T, MD, 5 mg at 11/06/20 0933 .  aspirin EC tablet 81 mg, 81 mg, Oral, Daily, Opyd, Ilene Qua, MD, 81 mg at 11/06/20 0933 .  busPIRone (BUSPAR) tablet 10 mg, 10 mg, Oral, BID, Opyd, Ilene Qua, MD, 10 mg at 11/06/20 0933 .  clopidogrel (PLAVIX) tablet 75 mg, 75 mg, Oral, Daily, Opyd, Ilene Qua, MD, 75 mg at 11/06/20 0933 .  diclofenac Sodium (VOLTAREN) 1 % topical gel 2 g, 2 g, Topical, Daily PRN, Opyd, Timothy S, MD .  famotidine (PEPCID) tablet 20 mg, 20 mg, Oral, Daily, Opyd, Ilene Qua, MD, 20 mg at 11/06/20 0934 .  gabapentin (NEURONTIN) capsule 200 mg, 200 mg, Oral, BID PRN, Opyd, Timothy S, MD .  heparin injection 5,000 Units, 5,000 Units, Subcutaneous, Q8H, Opyd, Ilene Qua, MD, 5,000 Units at 11/05/20 2313 .  hydrALAZINE (APRESOLINE) tablet 25 mg, 25 mg, Oral, Q4H PRN, Dwyane Dee, MD, 25 mg at 11/04/20 1723 .  insulin aspart (novoLOG) injection 0-5 Units, 0-5 Units, Subcutaneous, QHS, Opyd, Timothy S, MD .  insulin aspart (novoLOG) injection 0-9 Units, 0-9 Units, Subcutaneous, TID WC, Opyd, Ilene Qua, MD, 1 Units at 11/06/20 1120 .  insulin glargine (LANTUS) injection 25 Units, 25 Units, Subcutaneous, BID, Opyd, Ilene Qua, MD, 25 Units at 11/06/20 0934 .  labetalol (NORMODYNE) injection 10 mg, 10 mg, Intravenous, Q4H PRN, Dwyane Dee, MD .  losartan (COZAAR) tablet 50 mg, 50 mg, Oral, Daily, Dwyane Dee, MD, 50 mg at 11/06/20 0934 .  mometasone-formoterol (DULERA) 200-5 MCG/ACT inhaler 2 puff, 2 puff,  Inhalation, BID, Opyd, Ilene Qua, MD, 2 puff at 11/06/20 0820 .  Pirfenidone TABS 801 mg, 3 tablet, Oral, TID, Opyd, Ilene Qua, MD, 801 mg at 11/06/20 0936 .  rosuvastatin (CRESTOR) tablet 20 mg, 20 mg, Oral, Daily, Opyd, Ilene Qua, MD, 20 mg at 11/06/20 0934 .  senna-docusate (Senokot-S) tablet 1 tablet, 1 tablet, Oral, QHS PRN, Opyd, Ilene Qua, MD .  traZODone (DESYREL) tablet 50 mg, 50 mg, Oral, QHS, Opyd, Timothy S, MD, 50 mg at 11/05/20 2315 .  umeclidinium bromide (INCRUSE ELLIPTA) 62.5 MCG/INH 1 puff, 1 puff, Inhalation, Daily, Opyd, Ilene Qua, MD, 1 puff at 11/06/20 0820 .  venlafaxine XR (EFFEXOR-XR) 24 hr capsule 75 mg, 75 mg, Oral, Q breakfast, Opyd, Ilene Qua, MD, 75 mg at 11/06/20 8550  Patients Current Diet:  Diet Order  Diet Heart Room service appropriate? Yes; Fluid consistency: Thin  Diet effective now                Precautions / Restrictions Precautions Precautions: Fall Precaution Comments: mod fall Restrictions Weight Bearing Restrictions: No   Has the patient had 2 or more falls or a fall with injury in the past year?No  Prior Activity Level Community (5-7x/wk): Mod I with RW  Prior Functional Level Prior Function Level of Independence: Independent with assistive device(s) Comments: Ind wirh ADLs/selfcare, Uses RW for ambulation  Self Care: Did the patient need help bathing, dressing, using the toilet or eating?  Independent  Indoor Mobility: Did the patient need assistance with walking from room to room (with or without device)? Independent  Stairs: Did the patient need assistance with internal or external stairs (with or without device)? Independent  Functional Cognition: Did the patient need help planning regular tasks such as shopping or remembering to take medications? Needed some help  Home Assistive Devices / Equipment Home Assistive Devices/Equipment: Walker (specify type),Cane (specify quad or straight),Shower chair with back Home  Equipment: Shower seat,Walker - 2 wheels,Cane - single point,Bedside commode  Prior Device Use: Indicate devices/aids used by the patient prior to current illness, exacerbation or injury? Walker  Current Functional Level Cognition  Arousal/Alertness: Awake/alert Overall Cognitive Status: Impaired/Different from baseline Orientation Level: Oriented X4 Safety/Judgement: Decreased awareness of safety Attention: Selective Selective Attention: Impaired Selective Attention Impairment: Verbal basic Memory: Impaired Memory Impairment: Retrieval deficit Awareness: Impaired Awareness Impairment: Emergent impairment Problem Solving: Impaired Problem Solving Impairment: Verbal basic Executive Function: Initiating Initiating: Impaired    Extremity Assessment (includes Sensation/Coordination)  Upper Extremity Assessment: Overall WFL for tasks assessed,LUE deficits/detail LUE Deficits / Details: reports numbness and tingling in L hand LUE Sensation: decreased light touch  Lower Extremity Assessment: Defer to PT evaluation LLE Deficits / Details: Functional weakness noted. Grossly 3/5. Noted buckling with mobility tasks.    ADLs  Overall ADL's : Needs assistance/impaired Eating/Feeding: Independent Grooming: Wash/dry hands,Wash/dry face,Min guard,Sitting Upper Body Bathing: Set up,Supervision/ safety,Sitting Lower Body Bathing: Minimal assistance,Min guard,Sit to/from stand Upper Body Dressing : Set up,Supervision/safety,Sitting Lower Body Dressing: Minimal assistance,Sit to/from stand Toilet Transfer: Minimal assistance,Ambulation,RW,Regular Toilet,Grab bars Toileting- Clothing Manipulation and Hygiene: Min guard,Sit to/from stand Functional mobility during ADLs: Minimal assistance,Min guard,Rolling walker General ADL Comments: min A for initial stand from EOB with RW    Mobility  Overal bed mobility: Needs Assistance Bed Mobility: Supine to Sit,Sit to Supine Supine to sit: Min  guard Sit to supine: Min guard General bed mobility comments: Not assessed, patient received sitting on sofa in room with sister present and returned to sofa    Transfers  Overall transfer level: Needs assistance Equipment used: Rolling walker (2 wheeled) Transfers: Sit to/from Stand Sit to Stand: Min guard General transfer comment: Min A for steadying assist to stand.    Ambulation / Gait / Stairs / Wheelchair Mobility  Ambulation/Gait Ambulation/Gait assistance: Counsellor (Feet): 125 Feet Assistive device: Rolling walker (2 wheeled) General Gait Details: improved ability with ambulation this visit. No buckling of L knee noted this visit. Her left LE is weaker with ambulation and she fatigued significantly with this distance. Gait velocity: decr    Posture / Balance Balance Overall balance assessment: Needs assistance Sitting-balance support: Feet supported Sitting balance-Leahy Scale: Good Standing balance support: Bilateral upper extremity supported,During functional activity Standing balance-Leahy Scale: Fair Standing balance comment: Reliant on UE and min guard    Special  needs/care consideration LOOP placement deferred to outpatient follow up per patient request     Previous Home Environment  Living Arrangements:  (son lives with her)  Lives With: Son Available Help at Discharge: Available 24 hours/day,Family Type of Home: Apartment Home Layout: One level Home Access: Level entry Bathroom Shower/Tub: Tub/shower unit,Curtain Biochemist, clinical: Handicapped height Bathroom Accessibility: Yes Home Care Services: Yes Type of Home Care Services: [place] (if known): [facility] Additional Comments: Owns a shower chair and uses a shower hose  Discharge Living Setting Plans for Discharge Living Setting: Patient's home,Apartment,Lives with (comment) (son lives with patient) Type of Home at Discharge: Apartment Discharge Home  Layout: One level Discharge Home Access: Level entry Discharge Bathroom Shower/Tub: Tub/shower unit Discharge Bathroom Toilet: Handicapped height Discharge Bathroom Accessibility: Yes How Accessible: Accessible via walker Does the patient have any problems obtaining your medications?: No  Social/Family/Support Systems Patient Roles: Parent Contact Information: sister, [name] Anticipated Caregiver: sister and son prn Anticipated Caregiver's Contact Information: [name] [phone] Ability/Limitations of Caregiver: intermittent assist Caregiver Availability: Intermittent Discharge Plan Discussed with Primary Caregiver: Yes Is Caregiver In Agreement with Plan?: Yes Does Caregiver/Family have Issues with Lodging/Transportation while Pt is in Rehab?: No  Goals Patient/Family Goal for Rehab: Mod I to supervision with PT and OT Expected length of stay: ELOS 7 to 11 days Pt/Family Agrees to Admission and willing to participate: Yes Program Orientation Provided & Reviewed with Pt/Caregiver Including Roles  & Responsibilities: Yes  Decrease burden of Care through IP rehab admission: n/a  Possible need for SNF placement upon discharge:not anticipated  Patient Condition: This patient's condition remains as documented in the consult dated 11/05/2020, in which the Rehabilitation Physician determined and documented that the patient's condition is appropriate for intensive rehabilitative care in an inpatient rehabilitation facility. Will admit to inpatient rehab today.  Preadmission Screen Completed By:  Cleatrice Burke, RN, 11/06/2020 2:11 PM ______________________________________________________________________   Discussed status with Dr. Naaman Plummer on 11/06/2020 at  1432 and received approval for admission today.  Admission Coordinator:  Cleatrice Burke, time 8127 Date 11/06/2020

## 2020-11-05 NOTE — Progress Notes (Addendum)
Inpatient Rehabilitation Admissions Coordinator  I met at bedside with patient for assessment. I discussed goals and expectations of a possible CIR admit. She was with [place] previously for 15 days. She prefers direct discharge home. I discussed that we will assess her progress with therapies to see if this is a realistic plan. I will follow up tomorrow.  Danne Baxter, RN, MSN Rehab Admissions Coordinator [phone] 11/05/2020 11:59 AM   I spoke with patient's;s sister, [name], by phone to give her an update on patient wishes. I discussed that I need to see how she progresses with therapy and she would not work with PT today due to a headache. I will follow up tomorrow.  Danne Baxter, RN, MSN Rehab Admissions Coordinator [phone] 11/05/2020 1:46 PM

## 2020-11-05 NOTE — Consult Note (Signed)
Physical Medicine and Rehabilitation Consult Reason for Consult: Left-sided weakness Referring Physician: Dr.Girguis   HPI: Erica [name] is a 65 y.o. 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  Per chart review patient lives with her children.  Independent with assistive device.  1 level home with level entry.  Good supportive family.  Presented 11/03/2020 with persistent headache and left-sided weakness x3 days.  MRI shows punctate acute infarction at the medial right parieto-occipital junction.  Chronic small vessel ischemic changes.  MRA of the head negative for large vessel occlusion.  MRI cervical spine with persistent patchy signal abnormality involving the central/dorsal aspect of the cervical spinal cord extending from C2-C7-T1 relatively stable and unchanged from previous tracings.  Admission chemistries unremarkable except chloride 113 glucose 100, creatinine 1.70, hemoglobin 9.2, hemoglobin A1c 6.3.  Echocardiogram with ejection fraction of 60 to 19% grade 1 diastolic dysfunction.  Currently maintained on aspirin 81 mg daily and Plavix 75 mg daily for CVA prophylaxis x3 weeks then aspirin alone.  Subcutaneous heparin for DVT prophylaxis.  Awaiting plan for loop recorder.  Therapy evaluations completed due to patient decreased functional mobility left side weakness recommendations of physical medicine rehab consult.   Review of Systems  Constitutional: Negative for chills and fever.  HENT: Negative for hearing loss.   Eyes: Negative for blurred vision and double vision.  Respiratory: Negative for cough and shortness of breath.   Cardiovascular: Negative for chest pain, palpitations and leg swelling.  Gastrointestinal: Positive for constipation. Negative for heartburn, nausea and vomiting.        GERD  Genitourinary: Negative for dysuria, flank pain and hematuria.  Musculoskeletal: Positive for myalgias.  Skin: Negative for rash.  Neurological: Positive for weakness and headaches.  Psychiatric/Behavioral: Positive for depression. The patient has insomnia.        Anxiety  All other systems reviewed and are negative.  Past Medical History:  Diagnosis Date  . Arthritis    especially in shoulders  . Asthma   . Depression   . Diabetes mellitus   . GERD (gastroesophageal reflux disease)   . Headache(784.0)    "mild"  . Hypertension   . Mental disorder    depression  . Neuropathy    feet   . Pain    arthritis pain - takes tramadol as needed  . Rectal polyp    very little bleeding with bowel movements- no pain  . Suicide attempt [facility])    Past Surgical History:  Procedure Laterality Date  . ANAL RECTAL MANOMETRY N/A 07/13/2016   Procedure: ANO RECTAL MANOMETRY;  Surgeon: Leighton Ruff, MD;  Location: [facility];  Service: Endoscopy;  Laterality: N/A;  . CESAREAN SECTION    . EUS N/A 11/21/2012   Procedure: LOWER ENDOSCOPIC ULTRASOUND (EUS);  Surgeon: Arta Silence, MD;  Location: [facility];  Service: Endoscopy;  Laterality: N/A;  . FLEXIBLE SIGMOIDOSCOPY N/A 11/21/2012   Procedure: FLEXIBLE SIGMOIDOSCOPY;  Surgeon: Arta Silence, MD;  Location: [facility];  Service: Endoscopy;  Laterality: N/A;  . FLEXIBLE SIGMOIDOSCOPY N/A 01/28/2013   Procedure: FLEXIBLE SIGMOIDOSCOPY;  Surgeon: Leighton Ruff, MD;  Location: [facility];  Service: Endoscopy;  Laterality: N/A;  . LAPAROSCOPIC LOW ANTERIOR RESECTION N/A 01/29/2013   Procedure: LAPAROSCOPIC LOW ANTERIOR RESECTION, Rigid Proctoscopy;  Surgeon: Leighton Ruff, MD;  Location: [facility];  Service: General;  Laterality:  N/A;  . LAPAROSCOPIC SIGMOID COLECTOMY N/A 11/14/2012   Procedure: DIAGNOSTIC LAPAROSCOPY AND SIGMOIDMOIDOSCOPY ;  Surgeon: Rolm Bookbinder, MD;  Location: [facility];  Service: General;  Laterality: N/A;  .  RECTAL ULTRASOUND N/A 07/13/2016   Procedure: RECTAL ULTRASOUND;  Surgeon: Leighton Ruff, MD;  Location: [facility];  Service: Endoscopy;  Laterality: N/A;  . TONSILLECTOMY    . TONSILLECTOMY AND ADENOIDECTOMY     Family History  Problem Relation Age of Onset  . Hypertension Father   . Stroke Father   . Diabetes Father   . Heart disease Mother   . Hypertension Mother   . Hyperlipidemia Mother   . Hypertension Sister   . Hypertension Brother   . Hypertension Sister   . Hypertension Brother   . Cancer Other 47       colon cancer   . Cancer Maternal Aunt        cancer, unknown type   . Cancer Maternal Uncle        bone cancer   . Cancer Paternal Aunt        lung cancer  . Cancer Paternal Uncle        lung cancer  . Cancer Maternal Uncle        colon cancer and prostate cancer   . Cancer Maternal Uncle        prostate cancer    Social History:  reports that she quit smoking about 2 years ago. Her smoking use included cigarettes. She has a 10.00 pack-year smoking history. She has never used smokeless tobacco. She reports previous alcohol use. She reports previous drug use. Drug: "Crack" cocaine. Allergies:  Allergies  Allergen Reactions  . Penicillins Hives, Itching and Swelling    Tongue swells up Has patient had a PCN reaction causing immediate rash, facial/tongue/throat swelling, SOB or lightheadedness with hypotension: Yes Has patient had a PCN reaction causing severe rash involving mucus membranes or skin necrosis: Yes Has patient had a PCN reaction that required hospitalization Yes Has patient had a PCN reaction occurring within the last 10 years: No If all of the above answers are "NO", then may proceed with Cephalosporin use.    Medications Prior to Admission  Medication Sig Dispense Refill  . acetaminophen (TYLENOL) 325 MG tablet Take 2 tablets (650 mg total) by mouth every 4 (four) hours as needed for mild pain (or temp > 37.5 C (99.5 F)).    Marland Kitchen albuterol  (VENTOLIN HFA) 108 (90 Base) MCG/ACT inhaler Inhale 2 puffs into the lungs every 4 (four) hours as needed for wheezing. 1 g 5  . amLODipine (NORVASC) 10 MG tablet Take 1 tablet (10 mg total) by mouth daily. 30 tablet 2  . BD PEN NEEDLE NANO 2ND GEN 32G X 4 MM MISC See admin instructions.    . blood glucose meter kit and supplies Dispense based on patient and insurance preference. Use up to four times daily as directed. (FOR ICD-10 E10.9, E11.9). 1 each 0  . blood glucose meter kit and supplies Dispense based on patient and insurance preference. Use up to four times daily as directed. (FOR ICD-10 E10.9, E11.9). 1 each 0  . busPIRone (BUSPAR) 10 MG tablet Take 1 tablet (10 mg total) by mouth daily at 6 (six) AM. (Patient taking differently: Take 20 mg by mouth.) 30 tablet 0  . diclofenac Sodium (VOLTAREN) 1 % GEL Apply 2 g topically 4 (four) times daily. (Patient taking differently: Apply 2 g topically daily as needed (Knee pain).)  2 g 0  . famotidine (PEPCID) 20 MG tablet Take 1 tablet (20 mg total) by mouth daily. 30 tablet 0  . fenofibrate (TRICOR) 145 MG tablet TAKE 1 TABLET(145 MG) BY MOUTH DAILY (Patient taking differently: Take 145 mg by mouth daily.) 90 tablet 1  . gabapentin (NEURONTIN) 300 MG capsule Take 1 capsule (300 mg total) by mouth 3 (three) times daily. (Patient taking differently: Take 300 mg by mouth 3 (three) times daily as needed (pain).) 90 capsule 0  . hydrALAZINE (APRESOLINE) 25 MG tablet Take 1.5 tablets (37.5 mg total) by mouth every 8 (eight) hours. 90 tablet 0  . icosapent Ethyl (VASCEPA) 1 g capsule Take 2 capsules (2 g total) by mouth 2 (two) times daily. 360 capsule 0  . losartan-hydrochlorothiazide (HYZAAR) 50-12.5 MG tablet Take 1 tablet by mouth daily.    . metoprolol succinate (TOPROL-XL) 50 MG 24 hr tablet TAKE 1 TABLET(50 MG) BY MOUTH DAILY WITH OR IMMEDIATELY FOLLOWING A MEAL (Patient taking differently: Take 50 mg by mouth daily as needed (For blood pressure).) 90  tablet 3  . Multiple Vitamin (MULTIVITAMIN ADULT PO) Take 1 tablet by mouth daily.    . Pirfenidone (ESBRIET) 267 MG TABS Take 3 tablets (801 mg total) by mouth in the morning, at noon, and at bedtime. 270 tablet 11  . rosuvastatin (CRESTOR) 20 MG tablet Take 20 mg by mouth daily.    . SYMBICORT 160-4.5 MCG/ACT inhaler Inhale 2 puffs into the lungs 2 (two) times daily. 1 each 5  . Tiotropium Bromide Monohydrate (SPIRIVA RESPIMAT) 1.25 MCG/ACT AERS Inhale 2 puffs into the lungs daily. 4 g 5  . TOUJEO SOLOSTAR 300 UNIT/ML Solostar Pen Inject 12 Units into the skin 2 (two) times daily. 50 units  in th A.M. and  40 units subcutaneous in the P.M. 1.5 mL 1  . traZODone (DESYREL) 50 MG tablet Take 50 mg by mouth at bedtime.    Marland Kitchen venlafaxine XR (EFFEXOR-XR) 75 MG 24 hr capsule Take 75 mg by mouth daily with breakfast.      Home: Home Living Family/patient expects to be discharged to:: Private residence Living Arrangements: Children Available Help at Discharge: Available 24 hours/day,Family Type of Home: Apartment Home Access: Level entry Home Layout: One level Bathroom Shower/Tub: Tub/shower unit,Curtain Biochemist, clinical: Handicapped height Home Equipment: Media planner - 2 wheels,Cane - single point,Bedside commode  Functional History: Prior Function Level of Independence: Independent with assistive device(s) Comments: Uses RW for ambulation Functional Status:  Mobility: Bed Mobility Overal bed mobility: Needs Assistance Bed Mobility: Supine to Sit,Sit to Supine Supine to sit: Min assist Sit to supine: Min assist General bed mobility comments: Min A for trunk elevation to come to sitting. Required min A for LE assist for return to supine. Transfers Overall transfer level: Needs assistance Equipment used: 1 person hand held assist Transfers: Sit to/from Stand Sit to Stand: Min assist General transfer comment: Min A for steadying assist to stand. Ambulation/Gait Ambulation/Gait  assistance: Mod assist Assistive device: 1 person hand held assist General Gait Details: Pt with increased L knee buckling during stance phase. Required manual blocking of L knee to take steps at EOB. Mod A for steadying.    ADL:    Cognition: Cognition Overall Cognitive Status: No family/caregiver present to determine baseline cognitive functioning Orientation Level: Oriented X4 Cognition Arousal/Alertness: Awake/alert Behavior During Therapy: WFL for tasks assessed/performed Overall Cognitive Status: No family/caregiver present to determine baseline cognitive functioning  Blood pressure (!) 142/79, pulse 78, temperature  98.2 F (36.8 C), temperature source Oral, resp. rate 18, height _0  (1.6 m), weight 83.3 kg, SpO2 99 %. Physical Exam Constitutional:      General: She is not in acute distress.    Appearance: Normal appearance.  HENT:     Head: Normocephalic.     Nose: Nose normal.     Mouth/Throat:     Mouth: Mucous membranes are moist.  Eyes:     Extraocular Movements: Extraocular movements intact.     Pupils: Pupils are equal, round, and reactive to light.  Cardiovascular:     Rate and Rhythm: Normal rate and regular rhythm.  Pulmonary:     Effort: Pulmonary effort is normal. No respiratory distress.  Abdominal:     General: There is no distension.     Palpations: Abdomen is soft.  Musculoskeletal:        General: Swelling (mild LUE) present.     Cervical back: Normal range of motion.  Skin:    General: Skin is warm.  Neurological:     Mental Status: She is alert.     Comments: Patient is alert.  Makes eye contact with examiner.  Oriented to person and place.  Follows commands.  Fair awareness and insight.  RUE and RLE 4-5/5. LUE 3 to 3+/5 prox to distal. With decreased LT/pain sense. Mild pronator drift. LLE 3-4/5 prox to distal, sensory more spare.   Psychiatric:        Mood and Affect: Mood normal.        Behavior: Behavior normal.     Results for orders  placed or performed during the hospital encounter of 11/03/20 (from the past 24 hour(s))  CBG monitoring, ED     Status: None   Collection Time: 11/04/20 12:47 PM  Result Value Ref Range   Glucose-Capillary 87 70 - 99 mg/dL  CBG monitoring, ED     Status: None   Collection Time: 11/04/20  5:09 PM  Result Value Ref Range   Glucose-Capillary 82 70 - 99 mg/dL  Glucose, capillary     Status: Abnormal   Collection Time: 11/04/20  9:40 PM  Result Value Ref Range   Glucose-Capillary 193 (H) 70 - 99 mg/dL  Basic metabolic panel     Status: Abnormal   Collection Time: 11/05/20  3:04 AM  Result Value Ref Range   Sodium 140 135 - 145 mmol/L   Potassium 3.1 (L) 3.5 - 5.1 mmol/L   Chloride 113 (H) 98 - 111 mmol/L   CO2 20 (L) 22 - 32 mmol/L   Glucose, Bld 98 70 - 99 mg/dL   BUN 17 8 - 23 mg/dL   Creatinine, Ser 1.63 (H) 0.44 - 1.00 mg/dL   Calcium 8.7 (L) 8.9 - 10.3 mg/dL   GFR, Estimated 35 (L) >60 mL/min   Anion gap 7 5 - 15  CBC with Differential/Platelet     Status: Abnormal   Collection Time: 11/05/20  3:04 AM  Result Value Ref Range   WBC 3.5 (L) 4.0 - 10.5 K/uL   RBC 2.83 (L) 3.87 - 5.11 MIL/uL   Hemoglobin 9.2 (L) 12.0 - 15.0 g/dL   HCT 25.7 (L) 36.0 - 46.0 %   MCV 90.8 80.0 - 100.0 fL   MCH 32.5 26.0 - 34.0 pg   MCHC 35.8 30.0 - 36.0 g/dL   RDW 15.9 (H) 11.5 - 15.5 %   Platelets 140 (L) 150 - 400 K/uL   nRBC 0.0 0.0 - 0.2 %  Neutrophils Relative % 58 %   Neutro Abs 2.1 1.7 - 7.7 K/uL   Lymphocytes Relative 31 %   Lymphs Abs 1.1 0.7 - 4.0 K/uL   Monocytes Relative 8 %   Monocytes Absolute 0.3 0.1 - 1.0 K/uL   Eosinophils Relative 2 %   Eosinophils Absolute 0.1 0.0 - 0.5 K/uL   Basophils Relative 0 %   Basophils Absolute 0.0 0.0 - 0.1 K/uL   Immature Granulocytes 1 %   Abs Immature Granulocytes 0.02 0.00 - 0.07 K/uL  Magnesium     Status: None   Collection Time: 11/05/20  3:04 AM  Result Value Ref Range   Magnesium 1.7 1.7 - 2.4 mg/dL   DG Chest 2 View  Result  Date: 11/03/2020 CLINICAL DATA:  Left-sided weakness for 3 days. EXAM: CHEST - 2 VIEW COMPARISON:  PA and lateral chest 03/30/2020.  CT chest 04/12/2019. FINDINGS: Extensive pulmonary fibrosis is identified as seen on the prior exams. No consolidative process, pneumothorax or effusion. Heart size is normal. No acute or focal bony abnormality. IMPRESSION: No acute disease. Pulmonary fibrosis. Electronically Signed   By: Inge Rise M.D.   On: 11/03/2020 17:38   MR ANGIO HEAD WO CONTRAST  Result Date: 11/04/2020 CLINICAL DATA:  Follow-up examination for acute stroke. EXAM: MRA HEAD WITHOUT CONTRAST TECHNIQUE: Angiographic images of the Circle of Willis were acquired using MRA technique without intravenous contrast. COMPARISON:  Previous MRI from 11/03/2020 and MRA from 06/25/2020. FINDINGS: ANTERIOR CIRCULATION: Visualized distal cervical segments of the internal carotid arteries are patent with antegrade flow. Petrous segments widely patent bilaterally. Mild atheromatous irregularity throughout the carotid siphons without hemodynamically significant stenosis. A1 segments patent bilaterally. Normal anterior communicating artery complex. Anterior cerebral arteries patent to their distal aspects without stenosis. No M1 stenosis or occlusion. Normal MCA bifurcations. Distal MCA branches remain well perfused and symmetric. POSTERIOR CIRCULATION: Both vertebral arteries widely patent to the vertebrobasilar junction without stenosis. Both PICA origins patent and normal. 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 Basilar widely patent distally without stenosis. Superior cerebellar arteries patent bilaterally. Both PCAs primarily supplied via the basilar. Mild atheromatous irregularity throughout the PCAs bilaterally without large vessel occlusion. Short-segment moderate left P2 stenosis, mildly progressed from previous (series 1029, image 8). No intracranial aneurysm. IMPRESSION: 1.  Negative MRA for large vessel occlusion. 2. Atheromatous irregularity involving the PCAs bilaterally with superimposed moderate left P2 stenosis, mildly progressed from previous. 3. No other hemodynamically significant or correctable stenosis about the major intracranial arterial vasculature. Electronically Signed   By: Jeannine Boga M.D.   On: 11/04/2020 01:44   MR Brain Wo Contrast (neuro protocol)  Result Date: 11/03/2020 CLINICAL DATA:  Worsening of left arm and leg numbness. Acute infarction in January of this year affecting the right thalamus. EXAM: MRI HEAD WITHOUT CONTRAST TECHNIQUE: Multiplanar, multiecho pulse sequences of the brain and surrounding structures were obtained without intravenous contrast. COMPARISON:  06/25/2010 FINDINGS: Brain: New punctate acute infarction at the right medial parietooccipital junction region. No other acute finding. Chronic small-vessel ischemic changes affect the pons. Few old small vessel cerebellar infarctions. Old small vessel infarctions in the right thalamus, the left thalamus, the basal ganglia and cerebral hemispheric white matter. Scattered old cortical and subcortical infarctions in both occipital lobes and both frontoparietal regions. No mass, hemorrhage, hydrocephalus or extra-axial collection. Vascular: Major vessels at the base of the brain show flow. Skull and upper cervical spine: Negative Sinuses/Orbits: Clear/normal Other: None IMPRESSION: Newly seen punctate acute  infarction at the medial right parietooccipital junction. No other change since the most recent exam of January. Chronic small-vessel ischemic changes of the pons, cerebellum, thalami, basal ganglia and hemispheric white matter. Old cortical and subcortical infarctions in the occipital regions and frontoparietal regions. Electronically Signed   By: Nelson Chimes M.D.   On: 11/03/2020 19:14   MR CERVICAL SPINE WO CONTRAST  Result Date: 11/04/2020 CLINICAL DATA:  Initial evaluation  for spinal stenosis. EXAM: MRI CERVICAL SPINE WITHOUT CONTRAST TECHNIQUE: Multiplanar, multisequence MR imaging of the cervical spine was performed. No intravenous contrast was administered. COMPARISON:  Prior MRI from 06/26/2020. FINDINGS: Alignment: Smooth reversal of the normal cervical lordosis with trace stepwise anterolisthesis of C2 on C3 through C4 on C5, stable from previous. Vertebrae: Vertebral body height maintained without acute or interval fracture. Bone marrow signal intensity within normal limits. No discrete or worrisome osseous lesions. Discogenic reactive endplate change noted about the C6-7 interspace. No abnormal marrow edema. Cord: Again seen is patchy signal abnormality involving the central/dorsal aspect of the cervical spinal cord, extending from the level of C2 through C7-T1. Signal change measures up to 3 mm in maximal diameter at the level of C2-3. Overall appearance is relatively unchanged from prior. 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 The underlying cord is again noted to be diffusely atrophic, also stable. Posterior Fossa, vertebral arteries, paraspinal tissues: Atrophy with chronic microvascular ischemic changes noted within the visualized brain. Craniocervical junction within normal limits. Paraspinous and prevertebral soft tissues within normal limits. Normal flow voids seen within the vertebral arteries bilaterally. Disc levels: C2-C3: Trace anterolisthesis. Tiny central disc protrusion minimally indents the ventral thecal sac. No spinal stenosis. Foramina are widely patent. C3-C4: Trace anterolisthesis. Shallow central disc protrusion minimally indents the ventral thecal sac. Left-sided facet degeneration. No spinal stenosis. Foramina remain widely patent. C4-C5: Trace anterolisthesis. Small central disc protrusion indents the ventral thecal  sac. Mild flattening of the ventral cord without significant spinal stenosis. Foramina remain widely patent. C5-C6: Degenerative intervertebral disc space narrowing. Broad-based central disc osteophyte complex indents the ventral thecal sac, asymmetric to the left. Mild flattening of the ventral cord with resultant mild spinal stenosis. Foramina remain patent. Appearance is stable. C6-C7: Degenerative intervertebral disc space narrowing. Broad-based central disc osteophyte complex indents the ventral thecal sac, contacting and flattening the ventral spinal cord. Mild spinal stenosis. Foramina remain patent. Appearance is stable. C7-T1: Right paracentral disc extrusion with both superior and inferior migration, slightly decreased in size and regressed as compared to previous. Disc material continues to contact the right ventral cord with mild cord flattening (series 9, image 27). Persistent mild spinal stenosis. Superimposed mild facet and ligament flavum hypertrophy. Foramina remain patent. T1-2: Normal interspace. Mild left greater than right facet hypertrophy. No stenosis. T2-3: Small right paracentral disc extrusion mildly indents the right ventral thecal sac. Slight superior migration of disc material. No significant spinal stenosis. Foramina remain patent. IMPRESSION: 1. Persistent patchy signal abnormality involving the central/dorsal aspect of the cervical spinal cord, extending from C2 through C7-T1, relatively stable and unchanged from previous. 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 previous myelitis). The underlying cord is diffusely atrophic, also stable. 2. Slight interval regression of right paracentral disc extrusion at C7-T1, with persistent mild spinal stenosis and right-sided cord flattening at this  level. 3. Otherwise stable multilevel cervical spondylosis  with resultant mild spinal stenosis at C5-6 and C6-7. Electronically Signed   By: Jeannine Boga M.D.   On: 11/04/2020 01:18   ECHOCARDIOGRAM COMPLETE  Result Date: 11/04/2020    ECHOCARDIOGRAM REPORT   Patient Name:   Erica [name] Date of Exam: 11/04/2020 Medical Rec #:  [id number]    Height:       63.0 in Accession #:    [id number]   Weight:       183.6 lb Date of Birth:  1956-01-14    BSA:          1.865 m Patient Age:    60 years     BP:           157/76 mmHg Patient Gender: F            HR:           50 bpm. Exam Location:  Inpatient Procedure: 2D Echo, Cardiac Doppler and Color Doppler Indications:    CVA  History:        Patient has prior history of Echocardiogram examinations, most                 recent 06/26/2020. Risk Factors:Hypertension and Diabetes.  Sonographer:    Luisa Hart RDCS Referring Phys: [id number] [place]  1. Left ventricular ejection fraction, by estimation, is 60 to 65%. The left ventricle has normal function. The left ventricle has no regional wall motion abnormalities. There is moderate concentric left ventricular hypertrophy. Left ventricular diastolic parameters are consistent with Grade I diastolic dysfunction (impaired relaxation). Elevated left atrial pressure.  2. Right ventricular systolic function is normal. The right ventricular size is normal.  3. The mitral valve is normal in structure. No evidence of mitral valve regurgitation. No evidence of mitral stenosis.  4. The aortic valve is tricuspid. There is mild calcification of the aortic valve. There is mild thickening of the aortic valve. Aortic valve regurgitation is mild. Mild aortic valve sclerosis is present, with no evidence of aortic valve stenosis.  5. The inferior vena cava is normal in size with greater than 50% respiratory variability, suggesting right atrial pressure of 3 mmHg. Comparison(s): No significant change from prior study. Prior images reviewed side by side. FINDINGS  Left Ventricle:  Left ventricular ejection fraction, by estimation, is 60 to 65%. The left ventricle has normal function. The left ventricle has no regional wall motion abnormalities. The left ventricular internal cavity size was normal in size. There is  moderate concentric left ventricular hypertrophy. Left ventricular diastolic parameters are consistent with Grade I diastolic dysfunction (impaired relaxation). Elevated left atrial pressure. Right Ventricle: The right ventricular size is normal. No increase in right ventricular wall thickness. Right ventricular systolic function is normal. Left Atrium: Left atrial size was normal in size. Right Atrium: Right atrial size was normal in size. Pericardium: There is no evidence of pericardial effusion. Mitral Valve: The mitral valve is normal in structure. There is mild thickening of the mitral valve leaflet(s). No evidence of mitral valve regurgitation. No evidence of mitral valve stenosis. MV peak gradient, 5.4 mmHg. The mean mitral valve gradient is  1.0 mmHg. Tricuspid Valve: The tricuspid valve is normal in structure. Tricuspid valve regurgitation is not demonstrated. No evidence of tricuspid stenosis. Aortic Valve: The aortic valve is tricuspid. There is mild calcification of the aortic valve. There is mild thickening of the aortic valve. Aortic valve regurgitation is mild. Aortic  regurgitation PHT measures 754 msec. Mild aortic valve sclerosis is present, with no evidence of aortic valve stenosis. Aortic valve mean gradient measures 4.0 mmHg. Aortic valve peak gradient measures 8.1 mmHg. Aortic valve area, by VTI measures 1.57 cm. Pulmonic Valve: The pulmonic valve was normal in structure. Pulmonic valve regurgitation is not visualized. No evidence of pulmonic stenosis. Aorta: The aortic root is normal in size and structure. Venous: The inferior vena cava is normal in size with greater than 50% respiratory variability, suggesting right atrial pressure of 3 mmHg. IAS/Shunts: No  atrial level shunt detected by color flow Doppler.  LEFT VENTRICLE PLAX 2D LVIDd:         4.10 cm     Diastology LVIDs:         2.50 cm     LV e' medial:    3.73 cm/s LV PW:         1.50 cm     LV E/e' medial:  18.3 LV IVS:        1.50 cm     LV e' lateral:   4.54 cm/s LVOT diam:     1.80 cm     LV E/e' lateral: 15.0 LV SV:         56 LV SV Index:   30 LVOT Area:     2.54 cm  LV Volumes (MOD) LV vol d, MOD A2C: 33.1 ml LV vol d, MOD A4C: 54.0 ml LV vol s, MOD A2C: 17.1 ml LV vol s, MOD A4C: 14.4 ml LV SV MOD A2C:     16.0 ml LV SV MOD A4C:     54.0 ml LV SV MOD BP:      27.8 ml RIGHT VENTRICLE RV Basal diam:  3.40 cm     PULMONARY VEINS RV Mid diam:    2.20 cm     A Reversal Duration: 113.00 msec RV S prime:     16.50 cm/s  A Reversal Velocity: 27.70 cm/s TAPSE (M-mode): 2.4 cm      Diastolic Velocity:  67.67 cm/s                             S/D Velocity:        1.30                             Systolic Velocity:   20.94 cm/s LEFT ATRIUM             Index LA diam:        2.90 cm 1.56 cm/m LA Vol (A2C):   53.3 ml 28.59 ml/m LA Vol (A4C):   51.4 ml 27.57 ml/m LA Biplane Vol: 55.2 ml 29.60 ml/m  AORTIC VALVE                   PULMONIC VALVE AV Area (Vmax):    1.58 cm    PV Vmax:       0.72 m/s AV Area (Vmean):   1.64 cm    PV Vmean:      57.200 cm/s AV Area (VTI):     1.57 cm    PV VTI:        0.209 m AV Vmax:           142.00 cm/s PV Peak grad:  2.0 mmHg AV Vmean:          88.100 cm/s PV Mean  grad:  1.0 mmHg AV VTI:            0.359 m AV Peak Grad:      8.1 mmHg AV Mean Grad:      4.0 mmHg LVOT Vmax:         88.40 cm/s LVOT Vmean:        56.700 cm/s LVOT VTI:          0.222 m LVOT/AV VTI ratio: 0.62 AI PHT:            754 msec  AORTA Ao Root diam: 3.50 cm Ao Asc diam:  3.40 cm MITRAL VALVE MV Area (PHT): 3.17 cm    SHUNTS MV Area VTI:   1.46 cm    Systemic VTI:  0.22 m MV Peak grad:  5.4 mmHg    Systemic Diam: 1.80 cm MV Mean grad:  1.0 mmHg MV Vmax:       1.16 m/s MV Vmean:      50.8 cm/s MV Decel Time: 239  msec MV E velocity: 68.10 cm/s MV A velocity: 99.80 cm/s MV E/A ratio:  0.68 Mihai Croitoru MD Electronically signed by Sanda Klein MD Signature Date/Time: 11/04/2020/3:40:47 PM    Final      Assessment/Plan: Diagnosis: 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  1. Does the need for close, 24 hr/day medical supervision in concert with the patient's rehab needs make it unreasonable for this patient to be served in a less intensive setting? Yes 2. Co-Morbidities requiring supervision/potential complications: DM, COPD, HTN, CKD III 3. Due to bladder management, bowel management, safety, skin/wound care, disease management, medication administration, pain management and patient education, does the patient require 24 hr/day rehab nursing? Yes 4. Does the patient require coordinated care of a physician, rehab nurse, therapy disciplines of PT, OT, SLP to address physical and functional deficits in the context of the above medical diagnosis(es)? Yes Addressing deficits in the following areas: balance, endurance, locomotion, strength, transferring, bowel/bladder control, bathing, dressing, feeding, grooming, toileting and psychosocial support 5. Can the patient actively participate in an intensive therapy program of at least 3 hrs of therapy per day at least 5 days per week? Yes 6. The potential for patient to make measurable gains while on inpatient rehab is excellent 7. Anticipated functional outcomes upon discharge from inpatient rehab are supervision  with PT, supervision with OT, n/a with SLP. 8. Estimated rehab length of stay to reach the above functional goals is: 7-11 days 9. Anticipated discharge destination: Home 10. Overall Rehab/Functional Prognosis: excellent  RECOMMENDATIONS: This patient's condition is appropriate for continued rehabilitative care in the following setting: CIR Patient has agreed to participate in  recommended program. Potentially Note that insurance prior authorization may be required for reimbursement for recommended care.  Comment: Pt prefers to go home. Her son is there. She doesn't feel that she's too far from her baseline prior to admit. I think she could benefit from an admission to improve functional mobility, balance, safety, etc. Rehab Admissions Coordinator to follow up.  Thanks,  Meredith Staggers, MD, Mellody Drown  I have personally performed a face to face diagnostic evaluation of this patient. Additionally, I have examined pertinent labs and radiographic images. I have reviewed and concur with the physician assistant's documentation above.   Lavon Paganini Angiulli, PA-C 11/05/2020

## 2020-11-05 NOTE — Progress Notes (Signed)
Inpatient Rehabilitation Admissions Coordinator  I will begin insurance approval with [employer] to see if they will approve a CIR admit prior to her d/c home.  Danne Baxter, RN, MSN Rehab Admissions Coordinator [phone] 11/05/2020 2:21 PM

## 2020-11-05 NOTE — Progress Notes (Signed)
Carotid duplex has been completed.  Results can be found under chart review under CV PROC. 11/05/2020 11:18 AM Lakeshia Dohner RVT, RDMS

## 2020-11-05 NOTE — Consult Note (Addendum)
ELECTROPHYSIOLOGY CONSULT NOTE  Patient ID: Erica [name] MRN: [id number], DOB/AGE: 11/19/1955   Admit date: 11/03/2020 Date of Consult: 11/05/2020  Primary Physician: Nolene Ebbs, MD Primary Cardiologist: Dr. Terri Skains Reason for Consultation: Cryptogenic stroke ; recommendations regarding Implantable Loop Recorder, requested by Dr. Leonie Man  History of Present Illness Erica [name] was admitted on 11/03/2020 with L facial numbness, L hand numbness, mild LLE weakness    PMHx includes: prior stroke (Jan 2022), smoker, ILD/IPF/emphesema, rectal cancer (in remission), DM, HTN, depression  Neurology notes: right parietooccipital stroke likely due to small vessel disease.  Cannot rule out embolic source give recent right caudate head and right anterior lateral thalamic infarcts .  she has undergone workup for stroke including echocardiogram.  The patient has been monitored on telemetry which has demonstrated sinus rhythm with no arrhythmias.    Neurology has deferred TEE  11/05/20: Carotid US  Summary:  Right Carotid: The extracranial vessels were near-normal with only minimal  wall         thickening or plaque. Left Carotid: The extracranial vessels were near-normal with only minimal  wall        thickening or plaque.  Vertebrals: Bilateral vertebral arteries demonstrate antegrade flow.    Echocardiogram this admission demonstrated    IMPRESSIONS  1. Left ventricular ejection fraction, by estimation, is 60 to 65%. The  left ventricle has normal function. The left ventricle has no regional  wall motion abnormalities. There is moderate concentric left ventricular  hypertrophy. Left ventricular  diastolic parameters are consistent with Grade I diastolic dysfunction  (impaired relaxation). Elevated left atrial pressure.  2. Right ventricular systolic function is normal. The right ventricular  size is normal.  3. The mitral valve is normal in structure. No evidence of  mitral valve  regurgitation. No evidence of mitral stenosis.  4. The aortic valve is tricuspid. There is mild calcification of the  aortic valve. There is mild thickening of the aortic valve. Aortic valve  regurgitation is mild. Mild aortic valve sclerosis is present, with no  evidence of aortic valve stenosis.  5. The inferior vena cava is normal in size with greater than 50%  respiratory variability, suggesting right atrial pressure of 3 mmHg.   Comparison(s): No significant change from prior study. Prior images  reviewed side by side.   Lab work is reviewed. Hypokalemia, anemia deferred to attending service   Prior to admission, the patient denies chest pain, shortness of breath, dizziness, palpitations, or syncope.  They are recovering from their stroke with plans to CIR at discharge.    Past Medical History:  Diagnosis Date  . Arthritis    especially in shoulders  . Asthma   . Depression   . Diabetes mellitus   . GERD (gastroesophageal reflux disease)   . Headache(784.0)    "mild"  . Hypertension   . Mental disorder    depression  . Neuropathy    feet   . Pain    arthritis pain - takes tramadol as needed  . Rectal polyp    very little bleeding with bowel movements- no pain  . Suicide attempt [facility])      Surgical History:  Past Surgical History:  Procedure Laterality Date  . ANAL RECTAL MANOMETRY N/A 07/13/2016   Procedure: ANO RECTAL MANOMETRY;  Surgeon: Leighton Ruff, MD;  Location: [facility];  Service: Endoscopy;  Laterality: N/A;  . CESAREAN SECTION    . EUS N/A 11/21/2012   Procedure: LOWER ENDOSCOPIC ULTRASOUND (EUS);  Surgeon: Arta Silence, MD;  Location: [facility];  Service: Endoscopy;  Laterality: N/A;  . FLEXIBLE SIGMOIDOSCOPY N/A 11/21/2012   Procedure: FLEXIBLE SIGMOIDOSCOPY;  Surgeon: Arta Silence, MD;  Location: [facility];  Service: Endoscopy;  Laterality: N/A;  . FLEXIBLE SIGMOIDOSCOPY N/A 01/28/2013   Procedure: FLEXIBLE  SIGMOIDOSCOPY;  Surgeon: Leighton Ruff, MD;  Location: [facility];  Service: Endoscopy;  Laterality: N/A;  . LAPAROSCOPIC LOW ANTERIOR RESECTION N/A 01/29/2013   Procedure: LAPAROSCOPIC LOW ANTERIOR RESECTION, Rigid Proctoscopy;  Surgeon: Leighton Ruff, MD;  Location: [facility];  Service: General;  Laterality: N/A;  . LAPAROSCOPIC SIGMOID COLECTOMY N/A 11/14/2012   Procedure: DIAGNOSTIC LAPAROSCOPY AND SIGMOIDMOIDOSCOPY ;  Surgeon: Rolm Bookbinder, MD;  Location: [facility];  Service: General;  Laterality: N/A;  . RECTAL ULTRASOUND N/A 07/13/2016   Procedure: RECTAL ULTRASOUND;  Surgeon: Leighton Ruff, MD;  Location: [facility];  Service: Endoscopy;  Laterality: N/A;  . TONSILLECTOMY    . TONSILLECTOMY AND ADENOIDECTOMY       Medications Prior to Admission  Medication Sig Dispense Refill Last Dose  . acetaminophen (TYLENOL) 325 MG tablet Take 2 tablets (650 mg total) by mouth every 4 (four) hours as needed for mild pain (or temp > 37.5 C (99.5 F)).   Past Week at Unknown time  . albuterol (VENTOLIN HFA) 108 (90 Base) MCG/ACT inhaler Inhale 2 puffs into the lungs every 4 (four) hours as needed for wheezing. 1 g 5 Past Week at Unknown time  . amLODipine (NORVASC) 10 MG tablet Take 1 tablet (10 mg total) by mouth daily. 30 tablet 2 11/02/2020 at Unknown time  . BD PEN NEEDLE NANO 2ND GEN 32G X 4 MM MISC See admin instructions.   unknown at unknown  . blood glucose meter kit and supplies Dispense based on patient and insurance preference. Use up to four times daily as directed. (FOR ICD-10 E10.9, E11.9). 1 each 0 unknown at unknown  . blood glucose meter kit and supplies Dispense based on patient and insurance preference. Use up to four times daily as directed. (FOR ICD-10 E10.9, E11.9). 1 each 0 unknown at unknown  . busPIRone (BUSPAR) 10 MG tablet Take 1 tablet (10 mg total) by mouth daily at 6 (six) AM. (Patient taking differently: Take 20 mg by mouth.) 30 tablet 0 11/02/2020 at Unknown time  .  diclofenac Sodium (VOLTAREN) 1 % GEL Apply 2 g topically 4 (four) times daily. (Patient taking differently: Apply 2 g topically daily as needed (Knee pain).) 2 g 0 Past Week at Unknown time  . famotidine (PEPCID) 20 MG tablet Take 1 tablet (20 mg total) by mouth daily. 30 tablet 0 11/02/2020 at Unknown time  . fenofibrate (TRICOR) 145 MG tablet TAKE 1 TABLET(145 MG) BY MOUTH DAILY (Patient taking differently: Take 145 mg by mouth daily.) 90 tablet 1 11/02/2020 at Unknown time  . gabapentin (NEURONTIN) 300 MG capsule Take 1 capsule (300 mg total) by mouth 3 (three) times daily. (Patient taking differently: Take 300 mg by mouth 3 (three) times daily as needed (pain).) 90 capsule 0 Past Week at Unknown time  . hydrALAZINE (APRESOLINE) 25 MG tablet Take 1.5 tablets (37.5 mg total) by mouth every 8 (eight) hours. 90 tablet 0 11/02/2020 at Unknown time  . icosapent Ethyl (VASCEPA) 1 g capsule Take 2 capsules (2 g total) by mouth 2 (two) times daily. 360 capsule 0 11/02/2020 at Unknown time  . losartan-hydrochlorothiazide (HYZAAR) 50-12.5 MG tablet Take 1 tablet by mouth daily.   unknown at unknown  .  metoprolol succinate (TOPROL-XL) 50 MG 24 hr tablet TAKE 1 TABLET(50 MG) BY MOUTH DAILY WITH OR IMMEDIATELY FOLLOWING A MEAL (Patient taking differently: Take 50 mg by mouth daily as needed (For blood pressure).) 90 tablet 3 Past Week at 0800  . Multiple Vitamin (MULTIVITAMIN ADULT PO) Take 1 tablet by mouth daily.   11/02/2020 at Unknown time  . Pirfenidone (ESBRIET) 267 MG TABS Take 3 tablets (801 mg total) by mouth in the morning, at noon, and at bedtime. 270 tablet 11 11/02/2020 at Unknown time  . rosuvastatin (CRESTOR) 20 MG tablet Take 20 mg by mouth daily.   11/02/2020 at Unknown time  . SYMBICORT 160-4.5 MCG/ACT inhaler Inhale 2 puffs into the lungs 2 (two) times daily. 1 each 5 11/02/2020 at Unknown time  . Tiotropium Bromide Monohydrate (SPIRIVA RESPIMAT) 1.25 MCG/ACT AERS Inhale 2 puffs into the lungs daily. 4  g 5 11/02/2020 at Unknown time  . TOUJEO SOLOSTAR 300 UNIT/ML Solostar Pen Inject 12 Units into the skin 2 (two) times daily. 50 units  in th A.M. and  40 units subcutaneous in the P.M. 1.5 mL 1 11/02/2020 at Unknown time  . traZODone (DESYREL) 50 MG tablet Take 50 mg by mouth at bedtime.   11/02/2020 at Unknown time  . venlafaxine XR (EFFEXOR-XR) 75 MG 24 hr capsule Take 75 mg by mouth daily with breakfast.   11/02/2020 at Unknown time    Inpatient Medications:  .  stroke: mapping our early stages of recovery book   Does not apply Once  . aspirin EC  81 mg Oral Daily  . busPIRone  10 mg Oral BID  . clopidogrel  75 mg Oral Daily  . famotidine  20 mg Oral Daily  . heparin  5,000 Units Subcutaneous Q8H  . insulin aspart  0-5 Units Subcutaneous QHS  . insulin aspart  0-9 Units Subcutaneous TID WC  . insulin glargine  25 Units Subcutaneous BID  . mometasone-formoterol  2 puff Inhalation BID  . Pirfenidone  3 tablet Oral TID  . rosuvastatin  20 mg Oral Daily  . traZODone  50 mg Oral QHS  . umeclidinium bromide  1 puff Inhalation Daily  . venlafaxine XR  75 mg Oral Q breakfast    Allergies:  Allergies  Allergen Reactions  . Penicillins Hives, Itching and Swelling    Tongue swells up Has patient had a PCN reaction causing immediate rash, facial/tongue/throat swelling, SOB or lightheadedness with hypotension: Yes Has patient had a PCN reaction causing severe rash involving mucus membranes or skin necrosis: Yes Has patient had a PCN reaction that required hospitalization Yes Has patient had a PCN reaction occurring within the last 10 years: No If all of the above answers are "NO", then may proceed with Cephalosporin use.     Social History   Socioeconomic History  . Marital status: Single    Spouse name: Not on file  . Number of children: 1  . Years of education: Not on file  . Highest education level: Not on file  Occupational History  . Not on file  Tobacco Use  . Smoking status:  Former Smoker    Packs/day: 0.50    Years: 20.00    Pack years: 10.00    Types: Cigarettes    Quit date: 01/05/2018    Years since quitting: 2.8  . Smokeless tobacco: Never Used  . Tobacco comment: Unknown   Vaping Use  . Vaping Use: Never used  Substance and Sexual Activity  .  Alcohol use: Not Currently  . Drug use: Not Currently    Types: "Crack" cocaine    Comment: Unknown   . Sexual activity: Never    Birth control/protection: None  Other Topics Concern  . Not on file  Social History Narrative  . Not on file   Social Determinants of Health   Financial Resource Strain: Not on file  Food Insecurity: Not on file  Transportation Needs: Not on file  Physical Activity: Not on file  Stress: Not on file  Social Connections: Not on file  Intimate Partner Violence: Not on file     Family History  Problem Relation Age of Onset  . Hypertension Father   . Stroke Father   . Diabetes Father   . Heart disease Mother   . Hypertension Mother   . Hyperlipidemia Mother   . Hypertension Sister   . Hypertension Brother   . Hypertension Sister   . Hypertension Brother   . Cancer Other 47       colon cancer   . Cancer Maternal Aunt        cancer, unknown type   . Cancer Maternal Uncle        bone cancer   . Cancer Paternal Aunt        lung cancer  . Cancer Paternal Uncle        lung cancer  . Cancer Maternal Uncle        colon cancer and prostate cancer   . Cancer Maternal Uncle        prostate cancer       Review of Systems: All other systems reviewed and are otherwise negative except as noted above.  Physical Exam: Vitals:   11/04/20 2030 11/05/20 0022 11/05/20 0525 11/05/20 0722  BP: (!) 152/75 (!) 142/79 (!) 142/73   Pulse: 68 78 (!) 57   Resp: _0 Temp: 98.3 F (36.8 C) 98.2 F (36.8 C) 98 F (36.7 C)   TempSrc: Oral Oral Oral   SpO2: 100% 99% 100% 99%  Weight:      Height:        GEN- The patient is well appearing, alert and oriented x 3  today.   Head- normocephalic, atraumatic Eyes-  Sclera clear, conjunctiva pink Ears- hearing intact Oropharynx- clear Neck- supple Lungs- CTA b/l, normal work of breathing Heart- RRR, no murmurs, rubs or gallops  GI- soft, NT, ND Extremities- no clubbing, cyanosis, or edema MS- no significant deformity or atrophy Skin- no rash or lesion Psych- euthymic mood, full affect   Labs:   Lab Results  Component Value Date   WBC 3.5 (L) 11/05/2020   HGB 9.2 (L) 11/05/2020   HCT 25.7 (L) 11/05/2020   MCV 90.8 11/05/2020   PLT 140 (L) 11/05/2020    Recent Labs  Lab 11/04/20 0418 11/05/20 0304  NA 141 140  K 3.5 3.1*  CL 113* 113*  CO2 21* 20*  BUN 19 17  CREATININE 1.70* 1.63*  CALCIUM 8.8* 8.7*  PROT 6.9  --   BILITOT 1.3*  --   ALKPHOS 38  --   ALT 32  --   AST 46*  --   GLUCOSE 100* 98   Lab Results  Component Value Date   CKTOTAL 68 08/12/2016   CKMB 0.8 08/12/2016   Lab Results  Component Value Date   CHOL 131 11/04/2020   CHOL 155 06/26/2020   CHOL 149 06/25/2020   Lab Results  Component  Value Date   HDL 58 11/04/2020   HDL 43 06/26/2020   HDL 50 06/25/2020   Lab Results  Component Value Date   LDLCALC 56 11/04/2020   LDLCALC 86 06/26/2020   LDLCALC 81 06/25/2020   Lab Results  Component Value Date   TRIG 87 11/04/2020   TRIG 130 06/26/2020   TRIG 101 06/25/2020   Lab Results  Component Value Date   CHOLHDL 2.3 11/04/2020   CHOLHDL 3.6 06/26/2020   CHOLHDL 3.0 06/25/2020   Lab Results  Component Value Date   LDLDIRECT 56 04/23/2020    No results found for: DDIMER   Radiology/Studies:  DG Chest 2 View Result Date: 11/03/2020 CLINICAL DATA:  Left-sided weakness for 3 days. EXAM: CHEST - 2 VIEW COMPARISON:  PA and lateral chest 03/30/2020.  CT chest 04/12/2019. FINDINGS: Extensive pulmonary fibrosis is identified as seen on the prior exams. No consolidative process, pneumothorax or effusion. Heart size is normal. No acute or focal bony  abnormality. IMPRESSION: No acute disease. Pulmonary fibrosis. Electronically Signed   By: Inge Rise M.D.   On: 11/03/2020 17:38     MR ANGIO HEAD WO CONTRAST Result Date: 11/04/2020 CLINICAL DATA:  Follow-up examination for acute stroke. EXAM: MRA HEAD WITHOUT CONTRAST TECHNIQUE: Angiographic images of the Circle of Willis were acquired using MRA technique without intravenous contrast. COMPARISON:  Previous MRI from 11/03/2020 and MRA from 06/25/2020. FINDINGS: ANTERIOR CIRCULATION: Visualized distal cervical segments of the internal carotid arteries are patent with antegrade flow. Petrous segments widely patent bilaterally. Mild atheromatous irregularity throughout the carotid siphons without hemodynamically significant stenosis. A1 segments patent bilaterally. Normal anterior communicating artery complex. Anterior cerebral arteries patent to their distal aspects without stenosis. No M1 stenosis or occlusion. Normal MCA bifurcations. Distal MCA branches remain well perfused and symmetric. POSTERIOR CIRCULATION: Both vertebral arteries widely patent to the vertebrobasilar junction without stenosis. Both PICA origins patent and normal. 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 Basilar widely patent distally without stenosis. Superior cerebellar arteries patent bilaterally. Both PCAs primarily supplied via the basilar. Mild atheromatous irregularity throughout the PCAs bilaterally without large vessel occlusion. Short-segment moderate left P2 stenosis, mildly progressed from previous (series 1029, image 8). No intracranial aneurysm. IMPRESSION: 1. Negative MRA for large vessel occlusion. 2. Atheromatous irregularity involving the PCAs bilaterally with superimposed moderate left P2 stenosis, mildly progressed from previous. 3. No other hemodynamically significant or correctable stenosis about the major intracranial arterial vasculature. Electronically Signed   By: Jeannine Boga M.D.   On: 11/04/2020 01:44    MR Brain Wo Contrast (neuro protocol) Result Date: 11/03/2020 CLINICAL DATA:  Worsening of left arm and leg numbness. Acute infarction in January of this year affecting the right thalamus. EXAM: MRI HEAD WITHOUT CONTRAST TECHNIQUE: Multiplanar, multiecho pulse sequences of the brain and surrounding structures were obtained without intravenous contrast. COMPARISON:  06/25/2010 FINDINGS: Brain: New punctate acute infarction at the right medial parietooccipital junction region. No other acute finding. Chronic small-vessel ischemic changes affect the pons. Few old small vessel cerebellar infarctions. Old small vessel infarctions in the right thalamus, the left thalamus, the basal ganglia and cerebral hemispheric white matter. Scattered old cortical and subcortical infarctions in both occipital lobes and both frontoparietal regions. No mass, hemorrhage, hydrocephalus or extra-axial collection. Vascular: Major vessels at the base of the brain show flow. Skull and upper cervical spine: Negative Sinuses/Orbits: Clear/normal Other: None IMPRESSION: Newly seen punctate acute infarction at the medial right parietooccipital junction. No other change  since the most recent exam of January. Chronic small-vessel ischemic changes of the pons, cerebellum, thalami, basal ganglia and hemispheric white matter. Old cortical and subcortical infarctions in the occipital regions and frontoparietal regions. Electronically Signed   By: Nelson Chimes M.D.   On: 11/03/2020 19:14    MR CERVICAL SPINE WO CONTRAST Result Date: 11/04/2020 CLINICAL DATA:  Initial evaluation for spinal stenosis. EXAM: MRI CERVICAL SPINE WITHOUT CONTRAST TECHNIQUE: Multiplanar, multisequence MR imaging of the cervical spine was performed. No intravenous contrast was administered. COMPARISON:  Prior MRI from 06/26/2020. FINDINGS: Alignment: Smooth reversal of the normal cervical lordosis with trace stepwise  anterolisthesis of C2 on C3 through C4 on C5, stable from previous. Vertebrae: Vertebral body height maintained without acute or interval fracture. Bone marrow signal intensity within normal limits. No discrete or worrisome osseous lesions. Discogenic reactive endplate change noted about the C6-7 interspace. No abnormal marrow edema. Cord: Again seen is patchy signal abnormality involving the central/dorsal aspect of the cervical spinal cord, extending from the level of C2 through C7-T1. Signal change measures up to 3 mm in maximal diameter at the level of C2-3. Overall appearance is relatively unchanged from prior. 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 The underlying cord is again noted to be diffusely atrophic, also stable. Posterior Fossa, vertebral arteries, paraspinal tissues: Atrophy with chronic microvascular ischemic changes noted within the visualized brain. Craniocervical junction within normal limits. Paraspinous and prevertebral soft tissues within normal limits. Normal flow voids seen within the vertebral arteries bilaterally. Disc levels: C2-C3: Trace anterolisthesis. Tiny central disc protrusion minimally indents the ventral thecal sac. No spinal stenosis. Foramina are widely patent. C3-C4: Trace anterolisthesis. Shallow central disc protrusion minimally indents the ventral thecal sac. Left-sided facet degeneration. No spinal stenosis. Foramina remain widely patent. C4-C5: Trace anterolisthesis. Small central disc protrusion indents the ventral thecal sac. Mild flattening of the ventral cord without significant spinal stenosis. Foramina remain widely patent. C5-C6: Degenerative intervertebral disc space narrowing. Broad-based central disc osteophyte complex indents the ventral thecal sac, asymmetric to the left. Mild flattening of the ventral cord with resultant  mild spinal stenosis. Foramina remain patent. Appearance is stable. C6-C7: Degenerative intervertebral disc space narrowing. Broad-based central disc osteophyte complex indents the ventral thecal sac, contacting and flattening the ventral spinal cord. Mild spinal stenosis. Foramina remain patent. Appearance is stable. C7-T1: Right paracentral disc extrusion with both superior and inferior migration, slightly decreased in size and regressed as compared to previous. Disc material continues to contact the right ventral cord with mild cord flattening (series 9, image 27). Persistent mild spinal stenosis. Superimposed mild facet and ligament flavum hypertrophy. Foramina remain patent. T1-2: Normal interspace. Mild left greater than right facet hypertrophy. No stenosis. T2-3: Small right paracentral disc extrusion mildly indents the right ventral thecal sac. Slight superior migration of disc material. No significant spinal stenosis. Foramina remain patent. IMPRESSION: 1. Persistent patchy signal abnormality involving the central/dorsal aspect of the cervical spinal cord, extending from C2 through C7-T1, relatively stable and unchanged from previous. 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 previous myelitis). The underlying cord is diffusely atrophic, also stable. 2. Slight interval regression of right paracentral disc extrusion at C7-T1, with persistent mild spinal stenosis and right-sided cord flattening at this level. 3. Otherwise stable multilevel cervical spondylosis with resultant mild  spinal stenosis at C5-6 and C6-7. Electronically Signed   By: Jeannine Boga M.D.   On: 11/04/2020 01:18       12-lead ECG SR All prior EKG's in EPIC reviewed with no documented atrial fibrillation  Telemetry SR  She recently wore a 2 week monitor just entered into Epic yesterday,  noted no AFib by my review, has not yet been read officially  Assessment and Plan:  1. Cryptogenic stroke The patient presents with cryptogenic stroke.  I spoke at length with the patient about monitoring for afib with an implantable loop recorder, given she just completed a 2 week monitor.  Risks, benefits, and alteratives to implantable loop recorder were discussed with the patient today.   At this time, the patient is undecided but will think about it  ADDEND I have revisited with the patient her sister is with her, we re-discussed rational for the loop monitor, she mentions she has too much going on right now and just not ready to proceed to the implant. They are agreeable to follow up in the office and I made follow up with her primary cardiologist, Dr. Terri Skains for follow up out patient.  Please call with questions.   Tressie Ragin Dyane Dustman, PA-C 11/05/2020

## 2020-11-05 NOTE — Evaluation (Signed)
Speech Language Pathology Evaluation Patient Details Name: Erica [name] MRN: [id number] DOB: [birth year] Today's Date: 11/05/2020 Time: 2376-2831 SLP Time Calculation (min) (ACUTE ONLY): 38.93 min  Problem List:  Patient Active Problem List   Diagnosis Date Noted  . Stroke ([place]) 11/03/2020  . Sinus bradycardia on ECG 11/03/2020  . Benign essential HTN   . Pancytopenia ([place])   . Stage 3b chronic kidney disease ([place])   . Anemia   . Labile blood glucose   . Labile blood pressure   . Diabetic peripheral neuropathy ([place])   . Essential hypertension   . AKI (acute kidney injury) ([place])   . Ataxia, late effect of cerebrovascular disease   . 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[place]) 06/30/2020  . Acute CVA (cerebrovascular accident) ([place]) 06/26/2020  . CVA (cerebral vascular accident) ([place]) 06/25/2020  . BMI 32.0-32.9,adult 06/03/2020  . Left upper lobe pneumonia 03/30/2020  . Chest pain 10/01/2019  . MDD (major depressive disorder), recurrent severe, without psychosis ([place]) 05/09/2019  . Suicide attempt ([place]) 05/09/2019  . Cocaine abuse ([place]) 05/09/2019  . Interstitial lung disease ([place]) 06/21/2016  . COPD (chronic obstructive pulmonary disease) ([place]) 06/21/2016  . Cough 06/21/2016  . GERD (gastroesophageal reflux disease) 06/21/2016  . Hypersomnia 06/21/2016  . Tobacco user 06/21/2016  . Rectal cancer ([place]) 01/30/2013   Past Medical History:  Past Medical History:  Diagnosis Date  . Arthritis    especially in shoulders  . Asthma   . Depression   . Diabetes mellitus   . GERD (gastroesophageal reflux disease)   . Headache(784.0)    "mild"  . Hypertension   . Mental disorder    depression  . Neuropathy    feet   . Pain    arthritis pain - takes tramadol as needed  . Rectal polyp    very little bleeding with bowel movements- no pain  . Suicide attempt [facility])    Past Surgical History:  Past Surgical History:  Procedure Laterality Date  . ANAL RECTAL MANOMETRY N/A 07/13/2016    Procedure: ANO RECTAL MANOMETRY;  Surgeon: Leighton Ruff, MD;  Location: [facility] ENDOSCOPY;  Service: Endoscopy;  Laterality: N/A;  . CESAREAN SECTION    . EUS N/A 11/21/2012   Procedure: LOWER ENDOSCOPIC ULTRASOUND (EUS);  Surgeon: Arta Silence, MD;  Location: [facility];  Service: Endoscopy;  Laterality: N/A;  . FLEXIBLE SIGMOIDOSCOPY N/A 11/21/2012   Procedure: FLEXIBLE SIGMOIDOSCOPY;  Surgeon: Arta Silence, MD;  Location: [facility];  Service: Endoscopy;  Laterality: N/A;  . FLEXIBLE SIGMOIDOSCOPY N/A 01/28/2013   Procedure: FLEXIBLE SIGMOIDOSCOPY;  Surgeon: Leighton Ruff, MD;  Location: [facility];  Service: Endoscopy;  Laterality: N/A;  . LAPAROSCOPIC LOW ANTERIOR RESECTION N/A 01/29/2013   Procedure: LAPAROSCOPIC LOW ANTERIOR RESECTION, Rigid Proctoscopy;  Surgeon: Leighton Ruff, MD;  Location: [facility] ORS;  Service: General;  Laterality: N/A;  . LAPAROSCOPIC SIGMOID COLECTOMY N/A 11/14/2012   Procedure: DIAGNOSTIC LAPAROSCOPY AND SIGMOIDMOIDOSCOPY ;  Surgeon: Rolm Bookbinder, MD;  Location: [facility] ORS;  Service: General;  Laterality: N/A;  . RECTAL ULTRASOUND N/A 07/13/2016   Procedure: RECTAL ULTRASOUND;  Surgeon: Leighton Ruff, MD;  Location: [facility] ENDOS[facility];  Service: Endoscopy;  Laterality: N/A;  . TONSILLECTOMY    . TONSILLECTOMY AND ADENOIDECTOMY     HPI:  Pt is a 65 y/o female admitted 5/24 secondary to L hand and face numbness and LLE weakness. Found to have R parieto-occipital junction infarct. PMH includes right thalamic/posterior limb internal capsule CVA in Jan 2022, DM, rectal cancer, COPD, MDD, HTN, and CKD.  Prior CIR admission 1/18-2/22/22; pt was d/cd with moderate cognitive deficits.  She reports only a few HH SLP treatments prior to being D/Cd.  Her sister reports ongoing cognitive deficits prior to this admission with delays in initiation and processing.   Assessment / Plan / Recommendation Clinical Impression  Pt presents with acute-on-chronic cognitive deficits, exacerbated by  new CVA and c/b deficits in registration and storage of new information, impaired selective attention, delayed processing and initiation.  Pt's sister was present for exam, and verbalized hope that Erica [name] could return home after rehab with supplemental support services in place [facility] aid). Pt will benefit from acute care SLP f/u and further intensive therapy to address the deficits described above.    SLP Assessment  SLP Recommendation/Assessment: Patient needs continued Speech Lanaguage Pathology Services SLP Visit Diagnosis: Cognitive communication deficit (R41.841)    Follow Up Recommendations  Inpatient Rehab    Frequency and Duration min 2x/week  2 weeks      SLP Evaluation Cognition  Overall Cognitive Status: Impaired/Different from baseline Arousal/Alertness: Awake/alert Orientation Level: Oriented X4 Attention: Selective Selective Attention: Impaired Selective Attention Impairment: Verbal basic Memory: Impaired Memory Impairment: Retrieval deficit Awareness: Impaired Awareness Impairment: Emergent impairment Problem Solving: Impaired Problem Solving Impairment: Verbal basic Executive Function: Initiating Initiating: Impaired       Comprehension  Auditory Comprehension Overall Auditory Comprehension: Appears within functional limits for tasks assessed    Expression Expression Primary Mode of Expression: Verbal Verbal Expression Overall Verbal Expression: Appears within functional limits for tasks assessed Written Expression Dominant Hand: Right Written Expression: Not tested   Oral / Motor  Motor Speech Overall Motor Speech: Appears within functional limits for tasks assessed   GO                    Erica [name] 11/05/2020, 3:47 PM  Erica [name], [place] Office number [phone] Pager [phone]

## 2020-11-05 NOTE — Progress Notes (Signed)
STROKE TEAM PROGRESS NOTE   INTERVAL HISTORY Patient is doing well.  She has no complaints.  Carotid ultrasound shows no significant extracranial stenosis and transthoracic echo showed normal ejection fraction.  She has been seen by therapy who recommended inpatient rehab.     Vitals:   11/05/20 0022 11/05/20 0525 11/05/20 0722 11/05/20 1218  BP: (!) 142/79 (!) 142/73  (!) 158/81  Pulse: 78 (!) 57  62  Resp: _0 Temp: 98.2 F (36.8 C) 98 F (36.7 C)  98 F (36.7 C)  TempSrc: Oral Oral  Oral  SpO2: 99% 100% 99% 100%  Weight:      Height:       CBC:  Recent Labs  Lab 11/03/20 1632 11/04/20 0418 11/05/20 0304  WBC 5.1 5.0 3.5*  NEUTROABS 3.4  --  2.1  HGB 10.5* 9.2* 9.2*  HCT 29.8* 26.2* 25.7*  MCV 92.5 93.6 90.8  PLT 147* PLATELET CLUMPS NOTED ON SMEAR, UNABLE TO ESTIMATE 974*   Basic Metabolic Panel:  Recent Labs  Lab 11/04/20 0418 11/05/20 0304  NA 141 140  K 3.5 3.1*  CL 113* 113*  CO2 21* 20*  GLUCOSE 100* 98  BUN 19 17  CREATININE 1.70* 1.63*  CALCIUM 8.8* 8.7*  MG  --  1.7   Lipid Panel:  Recent Labs  Lab 11/04/20 0418  CHOL 131  TRIG 87  HDL 58  CHOLHDL 2.3  VLDL 17  LDLCALC 56   HgbA1c:  Recent Labs  Lab 11/04/20 0418  HGBA1C 6.3*   Urine Drug Screen: No results for input(s): LABOPIA, COCAINSCRNUR, LABBENZ, AMPHETMU, THCU, LABBARB in the last 168 hours.  Alcohol Level No results for input(s): ETH in the last 168 hours.  IMAGING   DG Chest 2 View Result Date: 11/03/2020 IMPRESSION: No acute disease. Pulmonary fibrosis.   MR ANGIO HEAD WO CONTRAST Result Date: 11/04/2020 IMPRESSION:  1. Negative MRA for large vessel occlusion.  2. Atheromatous irregularity involving the PCAs bilaterally with superimposed moderate left P2 stenosis, mildly progressed from previous.  3. No other hemodynamically significant or correctable stenosis about the major intracranial arterial vasculature.  MR Brain Wo Contrast (neuro protocol) Result  Date: 11/03/2020 IMPRESSION: Newly seen punctate acute infarction at the medial right parietooccipital junction. No other change since the most recent exam of January. Chronic small-vessel ischemic changes of the pons, cerebellum, thalami, basal ganglia and hemispheric white matter. Old cortical and subcortical infarctions in the occipital regions and frontoparietal regions.   MR CERVICAL SPINE WO CONTRAST Result Date: 11/04/2020 IMPRESSION:  1. Persistent patchy signal abnormality involving the central/dorsal aspect of the cervical spinal cord, extending from C2 through C7-T1, relatively stable and unchanged from previous. 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 previous myelitis). The underlying cord is diffusely atrophic, also stable.  2. Slight interval regression of right paracentral disc extrusion at C7-T1, with persistent mild spinal stenosis and right-sided cord flattening at this level. 3. Otherwise stable multilevel cervical spondylosis with resultant mild spinal stenosis at C5-6 and C6-7.    PHYSICAL EXAM General: Well-developed, well-nourished Caucasian female lying comfortably in bed, NAD HEENT: PERRLA, MMM Cardiovascular: Normal rate and regular rhythm Respiratory: No respiratory distress, breathing on room air comfortably GI: Soft, nondistended, no rigidity no tenderness Extremities: No peripheral edema  Neurological: Mental Status: Alert, oriented, thought content appropriate.  Speech fluent without evidence of aphasia.  Able  to follow 3 step commands without difficulty. Cranial Nerves: II:  Visual fields grossly normal, pupils equal, round, reactive to light and accommodation III,IV, VI: ptosis not present, extra-ocular motions intact bilaterally V,VII: smile symmetric, reduced facial light touch sensation on left. VIII: hearing normal  bilaterally IX,X: uvula rises symmetrically XI: bilateral shoulder shrug XII: midline tongue extension without atrophy or fasciculations  Motor: Right :  Upper extremity   5/5                                      Left:     Upper extremity   4/5             Lower extremity   5/5                                                  Lower extremity   4/5 Tone and bulk:normal tone throughout Sensory: Reduced light touch sensation on left side Cerebellar: normal finger-to-nose,  normal heel-to-shin test Gait: deferred  ASSESSMENT/PLAN [name] is a 65 y.o. 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  Stroke:  right parietooccipital stroke likely due to small vessel disease.  Cannot rule out embolic source give recent right caudate head and right anterior lateral thalamic infarcts   MRI  punctate acute infarction at the medial right parietooccipital junction.  MRA  Negative for LVO, irregularity involving the PCAs bilaterally with superimposed moderate left P2 stenosis  Carotid Doppler no significant bilateral extracranial stenosis  2D Echo normal ejection fraction.  Loop recorder prior to discharge, EP team made aware  LDL 56  HgbA1c 5.2  VTE prophylaxis - heparin sq    Diet   Diet Heart Room service appropriate? Yes; Fluid consistency: Thin    Plavix prior to admission, now on aspirin 81 mg daily and clopidogrel 75 mg daily.  DAPT with Aspirin and Plavix for 3 weeks then aspirin alone  Therapy recommendations: CLR  Disposition:  pending  Prior strokes  06/25/2020 right caudate head and right anterior lateral thalamic infarcts, likely secondary to small vessel diseaset.  Placed on DAPT for 3 weeks and Plavix  alone.  LDL 86 -increase atorvastatin from 20 mg to 40 mg daily and ongoing use of fenofibrate and Vascepa.  Residual deficits of left hemiparesis and left hemisensory impairment.  Discharged to CIR. Placed on 14-day mobile cardiac amatory telemetry to evaluate for possible atrial fibrillation   Hypertension  Home meds:  Amlodipine 36m daily, hydralazine 37.5 q 8hr, hyzaar 50-12.569mdaily, Toprol-XL 5044maily  Stable . Permissive hypertension (OK if < 220/120) but gradually normalize in 5-7 days . Long-term BP goal normotensive  Hyperlipidemia  Home meds:  crestor 61m68mily, resumed in hospital  LDL 56, goal < 70  Continue statin at discharge  Diabetes type II Controlled  Home meds:  Toujeo solostar 300 12 units bid  HgbA1c 5.2, goal < 7.0  CBGs Recent Labs    11/05/20 0600 11/05/20 1137 11/05/20 1615  GLUCAP 88 183* 194*  SSI  lantus 25 units bid  Other Stroke Risk Factors  Advanced Age >/= 68   Cigarette smoker, former, quit 01/05/2018  Obesity, Body mass index is 32.52 kg/m., BMI >/= 30 associated with increased stroke risk, recommend weight loss, diet and exercise as appropriate   Family hx stroke (father)  CAD: Coronary artery calcification:  Other Active Problems  Anxiety/depression: Effexor-XR 62m daily  Asthma: Dulera inhaler  GERD  Neuropathy feet: neurontin 2025mbid prn  arthritis  Hospital day # 1    Recommend aspirin Plavix for 3 weeks followed by aspirin alone and aggressive risk factor modification.  Recommend loop recorder prior to discharge for paroxysmal A. fib.  Long discussion with patient and answered questions.  Discussed with Dr. DaDwyane Dee Greater than 50% time during this 25-minute visit was spent on counseling and coordination of care about her stroke and discussion about stroke prevention and treatment. Stroke team will sign off.  Kindly call for questions.  Follow-up as outpatient stroke clinic in 2  months PrAntony ContrasMDChurchtownager: [phone]/26/2022 5:35 PM  To contact Stroke Continuity provider, please refer to [email or web address], contact General Neurology

## 2020-11-05 NOTE — Evaluation (Signed)
Occupational Therapy Evaluation Patient Details Name: Erica [name] MRN: [id number] DOB: Aug 01, 1955 Today's Date: 11/05/2020    History of Present Illness Pt is a 65 y/o female admitted 5/24 secondary to L hand and face numbness and LLE weakness. Found to have R parietoocipital junction infarct. PMH includes CVA, DM, rectal cancer, COPD, MDD, HTN, and CKD.   Clinical Impression   Pt presents with decline in function and safety with ADLs and ADL mobility with impaired balance and endurance; decreased sensation in L UE. PTA pt lived at home with her son and was active with [facility] OT and PT. Pt currently required min A with LB ADLs and mobility using RW. Pt would benefit from acute OT services to address impairments to maximize level of function and safety    Follow Up Recommendations  CIR    Equipment Recommendations  None recommended by OT    Recommendations for Other Services       Precautions / Restrictions Precautions Precautions: Fall Restrictions Weight Bearing Restrictions: No      Mobility Bed Mobility Overal bed mobility: Needs Assistance Bed Mobility: Supine to Sit;Sit to Supine     Supine to sit: Min guard Sit to supine: Min guard        Transfers Overall transfer level: Needs assistance Equipment used: 1 person hand held assist;Rolling walker (2 wheeled) Transfers: Sit to/from Stand Sit to Stand: Min assist         General transfer comment: Min A for steadying assist to stand.    Balance Overall balance assessment: Needs assistance Sitting-balance support: No upper extremity supported;Feet supported Sitting balance-Leahy Scale: Fair     Standing balance support: Single extremity supported;No upper extremity supported;During functional activity Standing balance-Leahy Scale: Poor                             ADL either performed or assessed with clinical judgement   ADL Overall ADL's : Needs assistance/impaired Eating/Feeding:  Independent   Grooming: Wash/dry hands;Wash/dry face;Min guard;Sitting   Upper Body Bathing: Set up;Supervision/ safety;Sitting   Lower Body Bathing: Minimal assistance;Min guard;Sit to/from stand   Upper Body Dressing : Set up;Supervision/safety;Sitting   Lower Body Dressing: Minimal assistance;Sit to/from stand   Toilet Transfer: Minimal assistance;Ambulation;RW;Regular Toilet;Grab bars   Toileting- Clothing Manipulation and Hygiene: Min guard;Sit to/from stand       Functional mobility during ADLs: Minimal assistance;Min guard;Rolling walker General ADL Comments: min A for initial stand from EOB with RW     Vision Patient Visual Report: No change from baseline       Perception     Praxis      Pertinent Vitals/Pain Pain Assessment: No/denies pain     Hand Dominance Right   Extremity/Trunk Assessment Upper Extremity Assessment Upper Extremity Assessment: Overall WFL for tasks assessed;LUE deficits/detail LUE Deficits / Details: reports numbness and tingling in L hand LUE Sensation: decreased light touch   Lower Extremity Assessment Lower Extremity Assessment: Defer to PT evaluation   Cervical / Trunk Assessment Cervical / Trunk Assessment: Normal   Communication Communication Communication: No difficulties   Cognition Arousal/Alertness: Awake/alert Behavior During Therapy: WFL for tasks assessed/performed Overall Cognitive Status: No family/caregiver present to determine baseline cognitive functioning                                     General Comments  Exercises     Shoulder Instructions      Home Living Family/patient expects to be discharged to:: Private residence Living Arrangements: Children Available Help at Discharge: Available 24 hours/day;Family Type of Home: Apartment Home Access: Level entry     Home Layout: One level     Bathroom Shower/Tub: Tub/shower unit;Curtain   Bathroom Toilet: Handicapped height      Home Equipment: Clinical cytogeneticist - 2 wheels;Cane - single point;Bedside commode   Additional Comments: Owns a shower chair and uses a shower hose      Prior Functioning/Environment Level of Independence: Independent with assistive device(s)        Comments: Ind wirh ADLs/selfcare, Uses RW for ambulation        OT Problem List: Decreased activity tolerance;Impaired balance (sitting and/or standing)      OT Treatment/Interventions: Self-care/ADL training;DME and/or AE instruction;Balance training;Therapeutic activities;Therapeutic exercise;Patient/family education    OT Goals(Current goals can be found in the care plan section) Acute Rehab OT Goals Patient Stated Goal: to be independent, go home OT Goal Formulation: With patient Time For Goal Achievement: 11/19/20 Potential to Achieve Goals: Good ADL Goals Pt Will Perform Grooming: with supervision;with set-up;standing Pt Will Perform Upper Body Bathing: with min guard assist;with supervision;standing Pt Will Perform Lower Body Bathing: with min guard assist;with supervision;sitting/lateral leans;sit to/from stand Pt Will Perform Upper Body Dressing: with min guard assist;with supervision;standing Pt Will Perform Lower Body Dressing: with min guard assist;with supervision;sitting/lateral leans;sit to/from stand Pt Will Transfer to Toilet: with min guard assist;with supervision;ambulating Pt Will Perform Toileting - Clothing Manipulation and hygiene: with supervision;with modified independence;sit to/from stand  OT Frequency: Min 2X/week   Barriers to D/C:            Co-evaluation              AM-PAC OT "6 Clicks" Daily Activity     Outcome Measure Help from another person eating meals?: None Help from another person taking care of personal grooming?: A Little Help from another person toileting, which includes using toliet, bedpan, or urinal?: A Little Help from another person bathing (including washing, rinsing,  drying)?: A Little Help from another person to put on and taking off regular upper body clothing?: None Help from another person to put on and taking off regular lower body clothing?: A Little 6 Click Score: 20   End of Session Equipment Utilized During Treatment: Gait belt  Activity Tolerance: Patient tolerated treatment well Patient left: in chair;with call bell/phone within reach;Other (comment) (CIR MD and transport coming in)  OT Visit Diagnosis: Unsteadiness on feet (R26.81);Muscle weakness (generalized) (M62.81)                Time: 6861-6837 OT Time Calculation (min): 23 min Charges:  OT General Charges $OT Visit: 1 Visit OT Evaluation $OT Eval Moderate Complexity: 1 Mod OT Treatments $Self Care/Home Management : 8-22 mins    Emmit Alexanders [facility] 11/05/2020, 2:09 PM

## 2020-11-05 NOTE — Progress Notes (Addendum)
Progress Note    Erica [name]   XEN:[id number]  DOB: November 30, 1955  DOA: 11/03/2020     1  PCP: Nolene Ebbs, MD  CC: left hand and facial numbness, LLE weakness  Hospital Course: Erica [name] is a 65 y.o. female with medical history significant for recent CVA (Jan 2022), COPD, pulmonary fibrosis, coronary artery disease, depression, rectal cancer, CKD IIIb, diabetes mellitus, history of right thalamic infarction in January 2022, and hydrosyringomyelia of the cervical spine, who presented to the ER with 3 days of left hand and facial numbness, and left leg weakness.  She has also been experiencing headache (that she describes as brain freeze) on the right side for approximately 4 weeks. Headache has been intermittent, occurring a couple times a day, and attributed by the patient to anxiety. Headache has been relieved with acetaminophen.  The left hand and facial numbness have been present for the past 3 days.  Upon arrival to the ED, patient is found to be afebrile, saturating well on room air, bradycardic in the upper 40s to 50s, and with blood pressure 180/80.  EKG features sinus bradycardia with rate 47.  Chest x-ray negative for acute cardiopulmonary disease.  MRI brain demonstrates a new acute infarction at the medial right parietooccipital junction in addition to chronic small vessel ischemic changes.   Interval History:  No events overnight.  Was complaining of a headache this morning when trying to work with PT. We discussed that she would likely benefit from further PT in CIR.  There was some hesitancy earlier in the day but after thinking about it further and after our discussion, she is amenable for pursuing discharge to CIR if still a candidate and when a bed available.  ROS: Constitutional: negative for chills and fevers, Respiratory: negative for cough, Cardiovascular: negative for chest pain and Gastrointestinal: negative for abdominal pain  Assessment & Plan:  Acute CVA -  Presents with 3 days of left hand and face tingling and mild LLE weakness and is found to have acute infarct in right parietooccipital junction  -Carotid Doppler reviewed, minimal stenosis/plaque - Echo reviewed, EF 60 to 10%, grade 1 diastolic dysfunction. - Loop recorder recommended prior to discharge per neurology; tentatively planned for 11/06/2020 - Continue aspirin and Plavix for 3 weeks followed by monotherapy aspirin -Continue statin -CIR recommended by PT.  Awaiting insurance evaluation/approval and bed availability  Hydrosyringomyelia  -MRI C-spine obtained on admission.  Shows stable and unchanged patchy persistent signal abnormality extending from C2 through C7-T1  Hypertension  - s/p permissive HTN - starting to resume home meds; losartan only for now and watch response  CKD IIIb  - SCr is 1.80 on admission, up from 1.53 in February 2022  - Renally-dose medications, s/p IVF on admission   COPD; IPF  - No cough or wheezing on admission, continue pirfenidone, ICS/LABA, Spiriva, and as-needed albuterol    Depression  - Stable, continue home regimen   Sinus bradycardia  - Rate upper 40s to 50s in ED  - Hold metoprolol, continue cardiac monitoring   Type II DM  - A1c 6.3% on admission  - Continue CBG checks and long-acting insulin, use low-intensity SSI if needed   - currently on Lantus 25 units BID; watch for hypoglycemia and decrease if necessary   Old records reviewed in assessment of this patient  Antimicrobials:   DVT prophylaxis: heparin injection 5,000 Units Start: 11/03/20 2200   Code Status:   Code Status: Full Code Family Communication:  Disposition Plan: Status is: Inpatient  Remains inpatient appropriate because:Inpatient level of care appropriate due to severity of illness   Dispo: The patient is from: Home              Anticipated d/c is to: CIR              Patient currently is medically stable to d/c pending scheduled placement of  loop recorder on 11/06/20   Difficult to place patient No  Risk of unplanned readmission score: Unplanned Admission- Pilot do not use: 36.49   Objective: Blood pressure (!) 158/81, pulse 62, temperature 98 F (36.7 C), temperature source Oral, resp. rate 16, height _0  (1.6 m), weight 83.3 kg, SpO2 100 %.  Examination: General appearance: alert, cooperative and no distress Head: Normocephalic, without obvious abnormality, atraumatic Eyes: EOMI Lungs: clear to auscultation bilaterally Heart: regular rate and rhythm and S1, S2 normal Abdomen: Soft, nontender, nondistended, bowel sounds present Extremities: No edema Skin: mobility and turgor normal Neurologic: 4/5 strength appreciated in left upper and lower extremity.  Subjective paresthesia in left lower and left upper extremity as well as left V2, V3 facial distributions  Consultants:   Neurology  Procedures:     Data Reviewed: I have personally reviewed following labs and imaging studies Results for orders placed or performed during the hospital encounter of 11/03/20 (from the past 24 hour(s))  CBG monitoring, ED     Status: None   Collection Time: 11/04/20  5:09 PM  Result Value Ref Range   Glucose-Capillary 82 70 - 99 mg/dL  Glucose, capillary     Status: Abnormal   Collection Time: 11/04/20  9:40 PM  Result Value Ref Range   Glucose-Capillary 193 (H) 70 - 99 mg/dL  Basic metabolic panel     Status: Abnormal   Collection Time: 11/05/20  3:04 AM  Result Value Ref Range   Sodium 140 135 - 145 mmol/L   Potassium 3.1 (L) 3.5 - 5.1 mmol/L   Chloride 113 (H) 98 - 111 mmol/L   CO2 20 (L) 22 - 32 mmol/L   Glucose, Bld 98 70 - 99 mg/dL   BUN 17 8 - 23 mg/dL   Creatinine, Ser 1.63 (H) 0.44 - 1.00 mg/dL   Calcium 8.7 (L) 8.9 - 10.3 mg/dL   GFR, Estimated 35 (L) >60 mL/min   Anion gap 7 5 - 15  CBC with Differential/Platelet     Status: Abnormal   Collection Time: 11/05/20  3:04 AM  Result Value Ref Range   WBC 3.5 (L)  4.0 - 10.5 K/uL   RBC 2.83 (L) 3.87 - 5.11 MIL/uL   Hemoglobin 9.2 (L) 12.0 - 15.0 g/dL   HCT 25.7 (L) 36.0 - 46.0 %   MCV 90.8 80.0 - 100.0 fL   MCH 32.5 26.0 - 34.0 pg   MCHC 35.8 30.0 - 36.0 g/dL   RDW 15.9 (H) 11.5 - 15.5 %   Platelets 140 (L) 150 - 400 K/uL   nRBC 0.0 0.0 - 0.2 %   Neutrophils Relative % 58 %   Neutro Abs 2.1 1.7 - 7.7 K/uL   Lymphocytes Relative 31 %   Lymphs Abs 1.1 0.7 - 4.0 K/uL   Monocytes Relative 8 %   Monocytes Absolute 0.3 0.1 - 1.0 K/uL   Eosinophils Relative 2 %   Eosinophils Absolute 0.1 0.0 - 0.5 K/uL   Basophils Relative 0 %   Basophils Absolute 0.0 0.0 - 0.1 K/uL   Immature Granulocytes  1 %   Abs Immature Granulocytes 0.02 0.00 - 0.07 K/uL  Magnesium     Status: None   Collection Time: 11/05/20  3:04 AM  Result Value Ref Range   Magnesium 1.7 1.7 - 2.4 mg/dL  Glucose, capillary     Status: None   Collection Time: 11/05/20  6:00 AM  Result Value Ref Range   Glucose-Capillary 88 70 - 99 mg/dL  Vitamin B12     Status: None   Collection Time: 11/05/20  9:53 AM  Result Value Ref Range   Vitamin B-12 737 180 - 914 pg/mL  Folate     Status: None   Collection Time: 11/05/20  9:53 AM  Result Value Ref Range   Folate 38.9 >5.9 ng/mL  Glucose, capillary     Status: Abnormal   Collection Time: 11/05/20 11:37 AM  Result Value Ref Range   Glucose-Capillary 183 (H) 70 - 99 mg/dL  Glucose, capillary     Status: Abnormal   Collection Time: 11/05/20  4:15 PM  Result Value Ref Range   Glucose-Capillary 194 (H) 70 - 99 mg/dL   Comment 1 Notify RN     Recent Results (from the past 240 hour(s))  SARS CORONAVIRUS 2 (TAT 6-24 HRS) Nasopharyngeal Nasopharyngeal Swab     Status: None   Collection Time: 11/04/20 12:57 AM   Specimen: Nasopharyngeal Swab  Result Value Ref Range Status   SARS Coronavirus 2 NEGATIVE NEGATIVE Final    Comment: (NOTE) SARS-CoV-2 target nucleic acids are NOT DETECTED.  The SARS-CoV-2 RNA is generally detectable in upper and  lower respiratory specimens during the acute phase of infection. Negative results do not preclude SARS-CoV-2 infection, do not rule out co-infections with other pathogens, and should not be used as the sole basis for treatment or other patient management decisions. Negative results must be combined with clinical observations, patient history, and epidemiological information. The expected result is Negative.  Fact Sheet for Patients: SugarRoll.be  Fact Sheet for Healthcare Providers: https://www.woods-mathews.com/  This test is not yet approved or cleared by the [place] FDA and  has been authorized for detection and/or diagnosis of SARS-CoV-2 by FDA under an Emergency Use Authorization (EUA). This EUA will remain  in effect (meaning this test can be used) for the duration of the COVID-19 declaration under Se ction 564(b)(1) of the Act, 21 U.S.C. section 360bbb-3(b)(1), unless the authorization is terminated or revoked sooner.  Performed at [facility], [place] [address], [place], [place] [address]      Radiology Studies: DG Chest 2 View  Result Date: 11/03/2020 CLINICAL DATA:  Left-sided weakness for 3 days. EXAM: CHEST - 2 VIEW COMPARISON:  PA and lateral chest 03/30/2020.  CT chest 04/12/2019. FINDINGS: Extensive pulmonary fibrosis is identified as seen on the prior exams. No consolidative process, pneumothorax or effusion. Heart size is normal. No acute or focal bony abnormality. IMPRESSION: No acute disease. Pulmonary fibrosis. Electronically Signed   By: Inge Rise M.D.   On: 11/03/2020 17:38   MR ANGIO HEAD WO CONTRAST  Result Date: 11/04/2020 CLINICAL DATA:  Follow-up examination for acute stroke. EXAM: MRA HEAD WITHOUT CONTRAST TECHNIQUE: Angiographic images of the Circle of Willis were acquired using MRA technique without intravenous contrast. COMPARISON:  Previous MRI from 11/03/2020 and MRA from 06/25/2020. FINDINGS:  ANTERIOR CIRCULATION: Visualized distal cervical segments of the internal carotid arteries are patent with antegrade flow. Petrous segments widely patent bilaterally. Mild atheromatous irregularity throughout the carotid siphons without hemodynamically significant stenosis.  A1 segments patent bilaterally. Normal anterior communicating artery complex. Anterior cerebral arteries patent to their distal aspects without stenosis. No M1 stenosis or occlusion. Normal MCA bifurcations. Distal MCA branches remain well perfused and symmetric. POSTERIOR CIRCULATION: Both vertebral arteries widely patent to the vertebrobasilar junction without stenosis. Both PICA origins patent and normal. 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 Basilar widely patent distally without stenosis. Superior cerebellar arteries patent bilaterally. Both PCAs primarily supplied via the basilar. Mild atheromatous irregularity throughout the PCAs bilaterally without large vessel occlusion. Short-segment moderate left P2 stenosis, mildly progressed from previous (series 1029, image 8). No intracranial aneurysm. IMPRESSION: 1. Negative MRA for large vessel occlusion. 2. Atheromatous irregularity involving the PCAs bilaterally with superimposed moderate left P2 stenosis, mildly progressed from previous. 3. No other hemodynamically significant or correctable stenosis about the major intracranial arterial vasculature. Electronically Signed   By: Jeannine Boga M.D.   On: 11/04/2020 01:44   MR Brain Wo Contrast (neuro protocol)  Result Date: 11/03/2020 CLINICAL DATA:  Worsening of left arm and leg numbness. Acute infarction in January of this year affecting the right thalamus. EXAM: MRI HEAD WITHOUT CONTRAST TECHNIQUE: Multiplanar, multiecho pulse sequences of the brain and surrounding structures were obtained without intravenous contrast. COMPARISON:  06/25/2010 FINDINGS: Brain: New punctate acute infarction at the  right medial parietooccipital junction region. No other acute finding. Chronic small-vessel ischemic changes affect the pons. Few old small vessel cerebellar infarctions. Old small vessel infarctions in the right thalamus, the left thalamus, the basal ganglia and cerebral hemispheric [name] matter. Scattered old cortical and subcortical infarctions in both occipital lobes and both frontoparietal regions. No mass, hemorrhage, hydrocephalus or extra-axial collection. Vascular: Major vessels at the base of the brain show flow. Skull and upper cervical spine: Negative Sinuses/Orbits: Clear/normal Other: None IMPRESSION: Newly seen punctate acute infarction at the medial right parietooccipital junction. No other change since the most recent exam of January. Chronic small-vessel ischemic changes of the pons, cerebellum, thalami, basal ganglia and hemispheric [name] matter. Old cortical and subcortical infarctions in the occipital regions and frontoparietal regions. Electronically Signed   By: Nelson Chimes M.D.   On: 11/03/2020 19:14   MR CERVICAL SPINE WO CONTRAST  Result Date: 11/04/2020 CLINICAL DATA:  Initial evaluation for spinal stenosis. EXAM: MRI CERVICAL SPINE WITHOUT CONTRAST TECHNIQUE: Multiplanar, multisequence MR imaging of the cervical spine was performed. No intravenous contrast was administered. COMPARISON:  Prior MRI from 06/26/2020. FINDINGS: Alignment: Smooth reversal of the normal cervical lordosis with trace stepwise anterolisthesis of C2 on C3 through C4 on C5, stable from previous. Vertebrae: Vertebral body height maintained without acute or interval fracture. Bone marrow signal intensity within normal limits. No discrete or worrisome osseous lesions. Discogenic reactive endplate change noted about the C6-7 interspace. No abnormal marrow edema. Cord: Again seen is patchy signal abnormality involving the central/dorsal aspect of the cervical spinal cord, extending from the level of C2 through C7-T1.  Signal change measures up to 3 mm in maximal diameter at the level of C2-3. Overall appearance is relatively unchanged from prior. 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 The underlying cord is again noted to be diffusely atrophic, also stable. Posterior Fossa, vertebral arteries, paraspinal tissues: Atrophy with chronic microvascular ischemic changes noted within the visualized brain. Craniocervical junction within normal limits. Paraspinous and prevertebral soft tissues within normal limits. Normal flow voids seen within the vertebral  arteries bilaterally. Disc levels: C2-C3: Trace anterolisthesis. Tiny central disc protrusion minimally indents the ventral thecal sac. No spinal stenosis. Foramina are widely patent. C3-C4: Trace anterolisthesis. Shallow central disc protrusion minimally indents the ventral thecal sac. Left-sided facet degeneration. No spinal stenosis. Foramina remain widely patent. C4-C5: Trace anterolisthesis. Small central disc protrusion indents the ventral thecal sac. Mild flattening of the ventral cord without significant spinal stenosis. Foramina remain widely patent. C5-C6: Degenerative intervertebral disc space narrowing. Broad-based central disc osteophyte complex indents the ventral thecal sac, asymmetric to the left. Mild flattening of the ventral cord with resultant mild spinal stenosis. Foramina remain patent. Appearance is stable. C6-C7: Degenerative intervertebral disc space narrowing. Broad-based central disc osteophyte complex indents the ventral thecal sac, contacting and flattening the ventral spinal cord. Mild spinal stenosis. Foramina remain patent. Appearance is stable. C7-T1: Right paracentral disc extrusion with both superior and inferior migration, slightly decreased in size and regressed as compared to previous. Disc material continues to  contact the right ventral cord with mild cord flattening (series 9, image 27). Persistent mild spinal stenosis. Superimposed mild facet and ligament flavum hypertrophy. Foramina remain patent. T1-2: Normal interspace. Mild left greater than right facet hypertrophy. No stenosis. T2-3: Small right paracentral disc extrusion mildly indents the right ventral thecal sac. Slight superior migration of disc material. No significant spinal stenosis. Foramina remain patent. IMPRESSION: 1. Persistent patchy signal abnormality involving the central/dorsal aspect of the cervical spinal cord, extending from C2 through C7-T1, relatively stable and unchanged from previous. 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 previous myelitis). The underlying cord is diffusely atrophic, also stable. 2. Slight interval regression of right paracentral disc extrusion at C7-T1, with persistent mild spinal stenosis and right-sided cord flattening at this level. 3. Otherwise stable multilevel cervical spondylosis with resultant mild spinal stenosis at C5-6 and C6-7. Electronically Signed   By: Jeannine Boga M.D.   On: 11/04/2020 01:18   ECHOCARDIOGRAM COMPLETE  Result Date: 11/04/2020    ECHOCARDIOGRAM REPORT   Patient Name:   Erica [name] Date of Exam: 11/04/2020 Medical Rec #:  [id number]    Height:       63.0 in Accession #:    [id number]   Weight:       183.6 lb Date of Birth:  September 27, 1955    BSA:          1.865 m Patient Age:    33 years     BP:           157/76 mmHg Patient Gender: F            HR:           50 bpm. Exam Location:  Inpatient Procedure: 2D Echo, Cardiac Doppler and Color Doppler Indications:    CVA  History:        Patient has prior history of Echocardiogram examinations, most                 recent 06/26/2020. Risk Factors:Hypertension and Diabetes.  Sonographer:    Luisa Hart RDCS  Referring Phys: [id number] [place]  1. Left ventricular ejection fraction, by estimation, is 60 to 65%. The left ventricle has normal function. The left ventricle has no regional wall motion abnormalities. There is moderate concentric left ventricular hypertrophy. Left ventricular diastolic parameters are consistent with Grade I diastolic dysfunction (impaired relaxation). Elevated left atrial pressure.  2. Right ventricular systolic function is normal. The right ventricular size is normal.  3. The mitral valve is normal in structure. No evidence of mitral valve regurgitation. No evidence of mitral stenosis.  4. The aortic valve is tricuspid. There is mild calcification of the aortic valve. There is mild thickening of the aortic valve. Aortic valve regurgitation is mild. Mild aortic valve sclerosis is present, with no evidence of aortic valve stenosis.  5. The inferior vena cava is normal in size with greater than 50% respiratory variability, suggesting right atrial pressure of 3 mmHg. Comparison(s): No significant change from prior study. Prior images reviewed side by side. FINDINGS  Left Ventricle: Left ventricular ejection fraction, by estimation, is 60 to 65%. The left ventricle has normal function. The left ventricle has no regional wall motion abnormalities. The left ventricular internal cavity size was normal in size. There is  moderate concentric left ventricular hypertrophy. Left ventricular diastolic parameters are consistent with Grade I diastolic dysfunction (impaired relaxation). Elevated left atrial pressure. Right Ventricle: The right ventricular size is normal. No increase in right ventricular wall thickness. Right ventricular systolic function is normal. Left Atrium: Left atrial size was normal in size. Right Atrium: Right atrial size was normal in size. Pericardium: There is no evidence of pericardial effusion. Mitral Valve: The mitral valve is normal in structure. There is mild  thickening of the mitral valve leaflet(s). No evidence of mitral valve regurgitation. No evidence of mitral valve stenosis. MV peak gradient, 5.4 mmHg. The mean mitral valve gradient is  1.0 mmHg. Tricuspid Valve: The tricuspid valve is normal in structure. Tricuspid valve regurgitation is not demonstrated. No evidence of tricuspid stenosis. Aortic Valve: The aortic valve is tricuspid. There is mild calcification of the aortic valve. There is mild thickening of the aortic valve. Aortic valve regurgitation is mild. Aortic regurgitation PHT measures 754 msec. Mild aortic valve sclerosis is present, with no evidence of aortic valve stenosis. Aortic valve mean gradient measures 4.0 mmHg. Aortic valve peak gradient measures 8.1 mmHg. Aortic valve area, by VTI measures 1.57 cm. Pulmonic Valve: The pulmonic valve was normal in structure. Pulmonic valve regurgitation is not visualized. No evidence of pulmonic stenosis. Aorta: The aortic root is normal in size and structure. Venous: The inferior vena cava is normal in size with greater than 50% respiratory variability, suggesting right atrial pressure of 3 mmHg. IAS/Shunts: No atrial level shunt detected by color flow Doppler.  LEFT VENTRICLE PLAX 2D LVIDd:         4.10 cm     Diastology LVIDs:         2.50 cm     LV e' medial:    3.73 cm/s LV PW:         1.50 cm     LV E/e' medial:  18.3 LV IVS:        1.50 cm     LV e' lateral:   4.54 cm/s LVOT diam:     1.80 cm     LV E/e' lateral: 15.0 LV SV:         56 LV SV Index:   30 LVOT Area:     2.54 cm  LV Volumes (MOD) LV vol d, MOD A2C: 33.1 ml LV vol d, MOD A4C: 54.0 ml LV vol s, MOD A2C: 17.1 ml LV vol s, MOD A4C: 14.4 ml LV SV MOD A2C:     16.0 ml LV SV MOD A4C:     54.0 ml LV SV MOD  BP:      27.8 ml RIGHT VENTRICLE RV Basal diam:  3.40 cm     PULMONARY VEINS RV Mid diam:    2.20 cm     A Reversal Duration: 113.00 msec RV S prime:     16.50 cm/s  A Reversal Velocity: 27.70 cm/s TAPSE (M-mode): 2.4 cm      Diastolic  Velocity:  09.38 cm/s                             S/D Velocity:        1.30                             Systolic Velocity:   18.29 cm/s LEFT ATRIUM             Index LA diam:        2.90 cm 1.56 cm/m LA Vol (A2C):   53.3 ml 28.59 ml/m LA Vol (A4C):   51.4 ml 27.57 ml/m LA Biplane Vol: 55.2 ml 29.60 ml/m  AORTIC VALVE                   PULMONIC VALVE AV Area (Vmax):    1.58 cm    PV Vmax:       0.72 m/s AV Area (Vmean):   1.64 cm    PV Vmean:      57.200 cm/s AV Area (VTI):     1.57 cm    PV VTI:        0.209 m AV Vmax:           142.00 cm/s PV Peak grad:  2.0 mmHg AV Vmean:          88.100 cm/s PV Mean grad:  1.0 mmHg AV VTI:            0.359 m AV Peak Grad:      8.1 mmHg AV Mean Grad:      4.0 mmHg LVOT Vmax:         88.40 cm/s LVOT Vmean:        56.700 cm/s LVOT VTI:          0.222 m LVOT/AV VTI ratio: 0.62 AI PHT:            754 msec  AORTA Ao Root diam: 3.50 cm Ao Asc diam:  3.40 cm MITRAL VALVE MV Area (PHT): 3.17 cm    SHUNTS MV Area VTI:   1.46 cm    Systemic VTI:  0.22 m MV Peak grad:  5.4 mmHg    Systemic Diam: 1.80 cm MV Mean grad:  1.0 mmHg MV Vmax:       1.16 m/s MV Vmean:      50.8 cm/s MV Decel Time: 239 msec MV E velocity: 68.10 cm/s MV A velocity: 99.80 cm/s MV E/A ratio:  0.68 Mihai Croitoru MD Electronically signed by Sanda Klein MD Signature Date/Time: 11/04/2020/3:40:47 PM    Final    VAS US CAROTID (at [facility] and [facility] only)  Result Date: 11/05/2020 Carotid Arterial Duplex Study Patient Name:  Erica [name]  Date of Exam:   11/05/2020 Medical Rec #: [id number]     Accession #:    [id number] Date of Birth: May 08, 1956     Patient Gender: F Patient Age:   65Y Exam Location:  [facility] Procedure:      VAS US CAROTID  Referring Phys: [id number] TIMOTHY S OPYD --------------------------------------------------------------------------------  Indications:       CVA. Risk Factors:      Hypertension, Diabetes, past history of smoking, coronary                    artery disease, prior CVA. Other  Factors:     COPD, CKD3. Comparison Study:  Previous exam 06/26/20 - WNL Performing Technologist: Rogelia Rohrer  Examination Guidelines: A complete evaluation includes B-mode imaging, spectral Doppler, color Doppler, and power Doppler as needed of all accessible portions of each vessel. Bilateral testing is considered an integral part of a complete examination. Limited examinations for reoccurring indications may be performed as noted.  Right Carotid Findings: +----------+--------+--------+--------+------------------+------------------+           PSV cm/sEDV cm/sStenosisPlaque DescriptionComments           +----------+--------+--------+--------+------------------+------------------+ CCA Prox  54      12                                intimal thickening +----------+--------+--------+--------+------------------+------------------+ CCA Distal39      14                                intimal thickening +----------+--------+--------+--------+------------------+------------------+ ICA Prox  42      15                                intimal thickening +----------+--------+--------+--------+------------------+------------------+ ICA Distal70      20                                                   +----------+--------+--------+--------+------------------+------------------+ ECA       27      6                                                    +----------+--------+--------+--------+------------------+------------------+ +----------+--------+-------+--------+-------------------+           PSV cm/sEDV cmsDescribeArm Pressure (mmHG) +----------+--------+-------+--------+-------------------+ [id number]                                         +----------+--------+-------+--------+-------------------+ +---------+--------+--+--------+--+---------+ VertebralPSV cm/s40EDV cm/s11Antegrade +---------+--------+--+--------+--+---------+  Left Carotid Findings:  +----------+--------+--------+--------+------------------+------------------+           PSV cm/sEDV cm/sStenosisPlaque DescriptionComments           +----------+--------+--------+--------+------------------+------------------+ CCA Prox  58      16                                intimal thickening +----------+--------+--------+--------+------------------+------------------+ CCA Distal50      15                                intimal thickening +----------+--------+--------+--------+------------------+------------------+ ICA Prox  43      16  intimal thickening +----------+--------+--------+--------+------------------+------------------+ ICA Distal50      19                                                   +----------+--------+--------+--------+------------------+------------------+ ECA       25      6                                                    +----------+--------+--------+--------+------------------+------------------+ +----------+--------+--------+--------+-------------------+           PSV cm/sEDV cm/sDescribeArm Pressure (mmHG) +----------+--------+--------+--------+-------------------+ [id number]                                          +----------+--------+--------+--------+-------------------+ +---------+--------+--+--------+--+---------+ VertebralPSV cm/s36EDV cm/s11Antegrade +---------+--------+--+--------+--+---------+   Summary: Right Carotid: The extracranial vessels were near-normal with only minimal wall                thickening or plaque. Left Carotid: The extracranial vessels were near-normal with only minimal wall               thickening or plaque. Vertebrals: Bilateral vertebral arteries demonstrate antegrade flow. *See table(s) above for measurements and observations.     Preliminary    VAS US CAROTID (at [facility] and [facility] only)      MR CERVICAL SPINE WO CONTRAST  Final Result    MR ANGIO HEAD WO  CONTRAST  Final Result    MR Brain Wo Contrast (neuro protocol)  Final Result    DG Chest 2 View  Final Result      Scheduled Meds: .  stroke: mapping our early stages of recovery book   Does not apply Once  . aspirin EC  81 mg Oral Daily  . busPIRone  10 mg Oral BID  . clopidogrel  75 mg Oral Daily  . famotidine  20 mg Oral Daily  . heparin  5,000 Units Subcutaneous Q8H  . insulin aspart  0-5 Units Subcutaneous QHS  . insulin aspart  0-9 Units Subcutaneous TID WC  . insulin glargine  25 Units Subcutaneous BID  . mometasone-formoterol  2 puff Inhalation BID  . Pirfenidone  3 tablet Oral TID  . rosuvastatin  20 mg Oral Daily  . traZODone  50 mg Oral QHS  . umeclidinium bromide  1 puff Inhalation Daily  . venlafaxine XR  75 mg Oral Q breakfast   PRN Meds: acetaminophen **OR** acetaminophen (TYLENOL) oral liquid 160 mg/5 mL **OR** acetaminophen, albuterol, diclofenac Sodium, gabapentin, hydrALAZINE, labetalol, senna-docusate Continuous Infusions:   LOS: 1 day  Time spent: Greater than 50% of the 35 minute visit was spent in counseling/coordination of care for the patient as laid out in the A&P.   Dwyane Dee, MD [facility] 11/05/2020, 4:36 PM

## 2020-11-05 NOTE — Progress Notes (Signed)
PT Cancellation Note  Patient Details Name: Erica [name] MRN: [id number] DOB: 02/05/1956   Cancelled Treatment:    Reason Eval/Treat Not Completed: Other (comment) (pt with headache and requested to rest) will follow up time permitting  Lyanne Co, DPT [facility] [phone]   [place] 11/05/2020, 1:19 PM

## 2020-11-06 ENCOUNTER — Encounter (HOSPITAL_COMMUNITY): Payer: Self-pay | Admitting: Physical Medicine & Rehabilitation

## 2020-11-06 ENCOUNTER — Inpatient Hospital Stay (HOSPITAL_COMMUNITY)
Admission: RE | Admit: 2020-11-06 | Discharge: 2020-11-21 | DRG: 057 | Disposition: A | Discharge status: Home Health | Payer: Medicare HMO | Source: Intra-hospital | Attending: Physical Medicine & Rehabilitation | Admitting: Physical Medicine & Rehabilitation

## 2020-11-06 DIAGNOSIS — E1122 Type 2 diabetes mellitus with diabetic chronic kidney disease: Secondary | ICD-10-CM | POA: Diagnosis present

## 2020-11-06 DIAGNOSIS — D61818 Other pancytopenia: Secondary | ICD-10-CM

## 2020-11-06 DIAGNOSIS — J439 Emphysema, unspecified: Secondary | ICD-10-CM

## 2020-11-06 DIAGNOSIS — R001 Bradycardia, unspecified: Secondary | ICD-10-CM

## 2020-11-06 DIAGNOSIS — F419 Anxiety disorder, unspecified: Secondary | ICD-10-CM

## 2020-11-06 DIAGNOSIS — Z823 Family history of stroke: Secondary | ICD-10-CM

## 2020-11-06 DIAGNOSIS — Z7982 Long term (current) use of aspirin: Secondary | ICD-10-CM

## 2020-11-06 DIAGNOSIS — D72819 Decreased white blood cell count, unspecified: Secondary | ICD-10-CM

## 2020-11-06 DIAGNOSIS — J449 Chronic obstructive pulmonary disease, unspecified: Secondary | ICD-10-CM | POA: Diagnosis present

## 2020-11-06 DIAGNOSIS — R112 Nausea with vomiting, unspecified: Secondary | ICD-10-CM | POA: Diagnosis present

## 2020-11-06 DIAGNOSIS — I5032 Chronic diastolic (congestive) heart failure: Secondary | ICD-10-CM

## 2020-11-06 DIAGNOSIS — N1832 Chronic kidney disease, stage 3b: Secondary | ICD-10-CM | POA: Diagnosis not present

## 2020-11-06 DIAGNOSIS — K219 Gastro-esophageal reflux disease without esophagitis: Secondary | ICD-10-CM | POA: Diagnosis present

## 2020-11-06 DIAGNOSIS — I6389 Other cerebral infarction: Secondary | ICD-10-CM

## 2020-11-06 DIAGNOSIS — J84112 Idiopathic pulmonary fibrosis: Secondary | ICD-10-CM | POA: Diagnosis present

## 2020-11-06 DIAGNOSIS — I10 Essential (primary) hypertension: Secondary | ICD-10-CM

## 2020-11-06 DIAGNOSIS — G441 Vascular headache, not elsewhere classified: Secondary | ICD-10-CM

## 2020-11-06 DIAGNOSIS — D709 Neutropenia, unspecified: Secondary | ICD-10-CM | POA: Diagnosis not present

## 2020-11-06 DIAGNOSIS — Z794 Long term (current) use of insulin: Secondary | ICD-10-CM

## 2020-11-06 DIAGNOSIS — R531 Weakness: Secondary | ICD-10-CM | POA: Diagnosis present

## 2020-11-06 DIAGNOSIS — Z85048 Personal history of other malignant neoplasm of rectum, rectosigmoid junction, and anus: Secondary | ICD-10-CM | POA: Diagnosis not present

## 2020-11-06 DIAGNOSIS — F32A Depression, unspecified: Secondary | ICD-10-CM | POA: Diagnosis present

## 2020-11-06 DIAGNOSIS — N183 Chronic kidney disease, stage 3 unspecified: Secondary | ICD-10-CM | POA: Diagnosis present

## 2020-11-06 DIAGNOSIS — Z87891 Personal history of nicotine dependence: Secondary | ICD-10-CM | POA: Diagnosis not present

## 2020-11-06 DIAGNOSIS — E1165 Type 2 diabetes mellitus with hyperglycemia: Secondary | ICD-10-CM | POA: Diagnosis present

## 2020-11-06 DIAGNOSIS — Z79899 Other long term (current) drug therapy: Secondary | ICD-10-CM

## 2020-11-06 DIAGNOSIS — E669 Obesity, unspecified: Secondary | ICD-10-CM | POA: Diagnosis not present

## 2020-11-06 DIAGNOSIS — E785 Hyperlipidemia, unspecified: Secondary | ICD-10-CM | POA: Diagnosis present

## 2020-11-06 DIAGNOSIS — E1169 Type 2 diabetes mellitus with other specified complication: Secondary | ICD-10-CM | POA: Diagnosis not present

## 2020-11-06 DIAGNOSIS — I129 Hypertensive chronic kidney disease with stage 1 through stage 4 chronic kidney disease, or unspecified chronic kidney disease: Secondary | ICD-10-CM | POA: Diagnosis present

## 2020-11-06 DIAGNOSIS — Z800 Family history of malignant neoplasm of digestive organs: Secondary | ICD-10-CM

## 2020-11-06 DIAGNOSIS — Z833 Family history of diabetes mellitus: Secondary | ICD-10-CM

## 2020-11-06 DIAGNOSIS — E119 Type 2 diabetes mellitus without complications: Secondary | ICD-10-CM | POA: Diagnosis not present

## 2020-11-06 DIAGNOSIS — I69354 Hemiplegia and hemiparesis following cerebral infarction affecting left non-dominant side: Principal | ICD-10-CM

## 2020-11-06 DIAGNOSIS — G950 Syringomyelia and syringobulbia: Secondary | ICD-10-CM

## 2020-11-06 LAB — BASIC METABOLIC PANEL WITH GFR
Anion gap: 5 (ref 5–15)
BUN: 16 mg/dL (ref 8–23)
CO2: 21 mmol/L — ABNORMAL LOW (ref 22–32)
Calcium: 8.9 mg/dL (ref 8.9–10.3)
Chloride: 114 mmol/L — ABNORMAL HIGH (ref 98–111)
Creatinine, Ser: 1.71 mg/dL — ABNORMAL HIGH (ref 0.44–1.00)
GFR, Estimated: 33 mL/min — ABNORMAL LOW
Glucose, Bld: 124 mg/dL — ABNORMAL HIGH (ref 70–99)
Potassium: 3.9 mmol/L (ref 3.5–5.1)
Sodium: 140 mmol/L (ref 135–145)

## 2020-11-06 LAB — CBC WITH DIFFERENTIAL/PLATELET
Abs Immature Granulocytes: 0.02 10*3/uL (ref 0.00–0.07)
Basophils Absolute: 0 10*3/uL (ref 0.0–0.1)
Basophils Relative: 0 %
Eosinophils Absolute: 0.1 10*3/uL (ref 0.0–0.5)
Eosinophils Relative: 2 %
HCT: 27 % — ABNORMAL LOW (ref 36.0–46.0)
Hemoglobin: 9.7 g/dL — ABNORMAL LOW (ref 12.0–15.0)
Immature Granulocytes: 1 %
Lymphocytes Relative: 29 %
Lymphs Abs: 0.9 10*3/uL (ref 0.7–4.0)
MCH: 32.4 pg (ref 26.0–34.0)
MCHC: 35.9 g/dL (ref 30.0–36.0)
MCV: 90.3 fL (ref 80.0–100.0)
Monocytes Absolute: 0.2 10*3/uL (ref 0.1–1.0)
Monocytes Relative: 6 %
Neutro Abs: 2 10*3/uL (ref 1.7–7.7)
Neutrophils Relative %: 62 %
Platelets: 139 10*3/uL — ABNORMAL LOW (ref 150–400)
RBC: 2.99 MIL/uL — ABNORMAL LOW (ref 3.87–5.11)
RDW: 16.1 % — ABNORMAL HIGH (ref 11.5–15.5)
WBC: 3.3 10*3/uL — ABNORMAL LOW (ref 4.0–10.5)
nRBC: 0 % (ref 0.0–0.2)

## 2020-11-06 LAB — MAGNESIUM: Magnesium: 1.9 mg/dL (ref 1.7–2.4)

## 2020-11-06 LAB — GLUCOSE, CAPILLARY
Glucose-Capillary: 108 mg/dL — ABNORMAL HIGH (ref 70–99)
Glucose-Capillary: 129 mg/dL — ABNORMAL HIGH (ref 70–99)
Glucose-Capillary: 106 mg/dL — ABNORMAL HIGH (ref 70–99)
Glucose-Capillary: 179 mg/dL — ABNORMAL HIGH (ref 70–99)

## 2020-11-06 MED ORDER — UMECLIDINIUM BROMIDE 62.5 MCG/INH IN AEPB
1.0000 | INHALATION_SPRAY | Freq: Every day | RESPIRATORY_TRACT | Status: DC
  Administered 2020-11-07 – 2020-11-18 (×8): 1 via RESPIRATORY_TRACT
  Filled 2020-11-06: qty 7

## 2020-11-06 MED ORDER — VENLAFAXINE HCL ER 75 MG PO CP24
75.0000 mg | ORAL_CAPSULE | Freq: Every day | ORAL | Status: DC
  Administered 2020-11-07 – 2020-11-21 (×15): 75 mg via ORAL
  Filled 2020-11-06 (×15): qty 1

## 2020-11-06 MED ORDER — INSULIN GLARGINE 100 UNIT/ML ~~LOC~~ SOLN: 25.0000 [IU] | Freq: Two times a day (BID) | SUBCUTANEOUS | 11 refills | Status: DC

## 2020-11-06 MED ORDER — HEPARIN SODIUM (PORCINE) 5000 UNIT/ML IJ SOLN: 5000.0000 [IU] | Freq: Three times a day (TID) | INTRAMUSCULAR | Status: DC

## 2020-11-06 MED ORDER — FAMOTIDINE 20 MG PO TABS
20.0000 mg | ORAL_TABLET | Freq: Every day | ORAL | Status: DC
  Administered 2020-11-07 – 2020-11-21 (×15): 20 mg via ORAL
  Filled 2020-11-06 (×15): qty 1

## 2020-11-06 MED ORDER — NOVOLOG 100 UNIT/ML IJ SOLN: 1.0000 [IU] | Freq: Three times a day (TID) | INTRAMUSCULAR | 11 refills | Status: DC

## 2020-11-06 MED ORDER — PIRFENIDONE 267 MG PO TABS
3.0000 | ORAL_TABLET | Freq: Three times a day (TID) | ORAL | Status: DC
  Administered 2020-11-06 – 2020-11-20 (×36): 801 mg via ORAL
  Filled 2020-11-06 (×43): qty 3

## 2020-11-06 MED ORDER — ACETAMINOPHEN 325 MG PO TABS
650.0000 mg | ORAL_TABLET | ORAL | Status: DC | PRN
  Administered 2020-11-17: 650 mg via ORAL
  Filled 2020-11-06 (×2): qty 2

## 2020-11-06 MED ORDER — SENNOSIDES-DOCUSATE SODIUM 8.6-50 MG PO TABS: 1.0000 | ORAL_TABLET | Freq: Every evening | ORAL | Status: DC | PRN

## 2020-11-06 MED ORDER — HEPARIN SODIUM (PORCINE) 5000 UNIT/ML IJ SOLN
5000.0000 [IU] | Freq: Three times a day (TID) | INTRAMUSCULAR | Status: DC
  Administered 2020-11-06 – 2020-11-19 (×37): 5000 [IU] via SUBCUTANEOUS
  Filled 2020-11-06 (×39): qty 1

## 2020-11-06 MED ORDER — ACETAMINOPHEN 160 MG/5ML PO SOLN
650.0000 mg | ORAL | Status: DC | PRN
Start: 2020-11-06 — End: 2020-11-21

## 2020-11-06 MED ORDER — CLOPIDOGREL BISULFATE 75 MG PO TABS
75.0000 mg | ORAL_TABLET | Freq: Every day | ORAL | Status: DC
  Administered 2020-11-07 – 2020-11-21 (×15): 75 mg via ORAL
  Filled 2020-11-06 (×15): qty 1

## 2020-11-06 MED ORDER — HYDRALAZINE HCL 25 MG PO TABS: 37.5000 mg | ORAL_TABLET | Freq: Three times a day (TID) | ORAL | 0 refills | Status: DC

## 2020-11-06 MED ORDER — ALBUTEROL SULFATE HFA 108 (90 BASE) MCG/ACT IN AERS
2.0000 | INHALATION_SPRAY | RESPIRATORY_TRACT | Status: DC | PRN
  Filled 2020-11-06: qty 6.7

## 2020-11-06 MED ORDER — CLOPIDOGREL BISULFATE 75 MG PO TABS: 75.0000 mg | ORAL_TABLET | Freq: Every day | ORAL | 0 refills | Status: DC

## 2020-11-06 MED ORDER — ACETAMINOPHEN 650 MG RE SUPP: 650.0000 mg | RECTAL | Status: DC | PRN

## 2020-11-06 MED ORDER — LOSARTAN POTASSIUM 50 MG PO TABS
50.0000 mg | ORAL_TABLET | Freq: Every day | ORAL | Status: DC
  Administered 2020-11-07 – 2020-11-21 (×15): 50 mg via ORAL
  Filled 2020-11-06 (×16): qty 1

## 2020-11-06 MED ORDER — TRAZODONE HCL 50 MG PO TABS
50.0000 mg | ORAL_TABLET | Freq: Every day | ORAL | Status: DC
  Administered 2020-11-06 – 2020-11-20 (×15): 50 mg via ORAL
  Filled 2020-11-06 (×15): qty 1

## 2020-11-06 MED ORDER — AMLODIPINE BESYLATE 5 MG PO TABS
5.0000 mg | ORAL_TABLET | Freq: Every day | ORAL | Status: DC
  Administered 2020-11-06: 5 mg via ORAL
  Filled 2020-11-06: qty 1

## 2020-11-06 MED ORDER — INSULIN ASPART 100 UNIT/ML IJ SOLN
0.0000 [IU] | Freq: Three times a day (TID) | INTRAMUSCULAR | Status: DC
  Administered 2020-11-07: 1 [IU] via SUBCUTANEOUS
  Administered 2020-11-07 – 2020-11-09 (×4): 2 [IU] via SUBCUTANEOUS
  Administered 2020-11-09 – 2020-11-10 (×2): 1 [IU] via SUBCUTANEOUS
  Administered 2020-11-11: 2 [IU] via SUBCUTANEOUS
  Administered 2020-11-11 – 2020-11-15 (×5): 1 [IU] via SUBCUTANEOUS
  Administered 2020-11-16: 3 [IU] via SUBCUTANEOUS
  Administered 2020-11-16 (×2): 2 [IU] via SUBCUTANEOUS
  Administered 2020-11-17 – 2020-11-18 (×4): 1 [IU] via SUBCUTANEOUS
  Administered 2020-11-18: 2 [IU] via SUBCUTANEOUS
  Administered 2020-11-19: 1 [IU] via SUBCUTANEOUS
  Administered 2020-11-19: 2 [IU] via SUBCUTANEOUS
  Administered 2020-11-20: 1 [IU] via SUBCUTANEOUS

## 2020-11-06 MED ORDER — AMLODIPINE BESYLATE 5 MG PO TABS
5.0000 mg | ORAL_TABLET | Freq: Every day | ORAL | Status: DC
  Administered 2020-11-07 – 2020-11-13 (×7): 5 mg via ORAL
  Filled 2020-11-06 (×7): qty 1

## 2020-11-06 MED ORDER — ASPIRIN 81 MG PO TBEC: 81.0000 mg | DELAYED_RELEASE_TABLET | Freq: Every day | ORAL | 11 refills | Status: DC

## 2020-11-06 MED ORDER — BUSPIRONE HCL 10 MG PO TABS
10.0000 mg | ORAL_TABLET | Freq: Two times a day (BID) | ORAL | Status: DC
  Administered 2020-11-06 – 2020-11-21 (×30): 10 mg via ORAL
  Filled 2020-11-06 (×30): qty 1

## 2020-11-06 MED ORDER — MOMETASONE FURO-FORMOTEROL FUM 200-5 MCG/ACT IN AERO
2.0000 | INHALATION_SPRAY | Freq: Two times a day (BID) | RESPIRATORY_TRACT | Status: DC
  Administered 2020-11-07 – 2020-11-18 (×14): 2 via RESPIRATORY_TRACT
  Filled 2020-11-06: qty 8.8

## 2020-11-06 MED ORDER — ROSUVASTATIN CALCIUM 20 MG PO TABS
20.0000 mg | ORAL_TABLET | Freq: Every day | ORAL | Status: DC
  Administered 2020-11-07 – 2020-11-21 (×15): 20 mg via ORAL
  Filled 2020-11-06 (×15): qty 1

## 2020-11-06 MED ORDER — ASPIRIN EC 81 MG PO TBEC
81.0000 mg | DELAYED_RELEASE_TABLET | Freq: Every day | ORAL | Status: DC
  Administered 2020-11-07 – 2020-11-21 (×15): 81 mg via ORAL
  Filled 2020-11-06 (×15): qty 1

## 2020-11-06 MED ORDER — INSULIN GLARGINE 100 UNIT/ML ~~LOC~~ SOLN
25.0000 [IU] | Freq: Two times a day (BID) | SUBCUTANEOUS | Status: DC
  Administered 2020-11-06 – 2020-11-11 (×10): 25 [IU] via SUBCUTANEOUS
  Filled 2020-11-06 (×12): qty 0.25

## 2020-11-06 MED ORDER — LOSARTAN POTASSIUM 50 MG PO TABS: 50.0000 mg | ORAL_TABLET | Freq: Every day | ORAL | Status: DC

## 2020-11-06 MED ORDER — DICLOFENAC SODIUM 1 % EX GEL
2.0000 g | Freq: Every day | CUTANEOUS | Status: DC | PRN
  Filled 2020-11-06: qty 100

## 2020-11-06 MED ORDER — TRAMADOL HCL 50 MG PO TABS
50.0000 mg | ORAL_TABLET | Freq: Three times a day (TID) | ORAL | Status: DC
  Administered 2020-11-06 – 2020-11-15 (×25): 50 mg via ORAL
  Filled 2020-11-06 (×26): qty 1

## 2020-11-06 MED ORDER — GABAPENTIN 100 MG PO CAPS: 200.0000 mg | ORAL_CAPSULE | Freq: Two times a day (BID) | ORAL | Status: DC | PRN

## 2020-11-06 NOTE — H&P (Signed)
Physical Medicine and Rehabilitation Admission H&P        Chief Complaint  Patient presents with  . 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  Per chart review lives with her children.  Independent with assistive device.  1 level home with level entry.  Good supportive family.  Presented 11/03/2020 with persistent headache and left-sided weakness x3 days.  MRI showed punctate acute infarction at the medial right parieto-occipital junction.  Chronic small vessel ischemic changes.  MRA of the head negative for large vessel occlusion.  MRI cervical spine with persistent patchy signal abnormality involving the central dorsal aspect of the cervical spinal cord extending from C2-C7-T1 relatively stable and unchanged from previous tracings.  Admission chemistries unremarkable except chloride 113 creatinine 1.70 hemoglobin 9.2 hemoglobin A1c 6.3.  Echocardiogram with ejection fraction of 60 to 74% grade 1 diastolic dysfunction.  Currently maintained on aspirin and Plavix for CVA prophylaxis x3 weeks and aspirin alone.  Subcutaneous heparin for DVT prophylaxis.  Awaiting plan for loop recorder.  Therapy evaluations completed due to patient's left-sided weakness was admitted for a comprehensive rehab program.   Review of Systems  Constitutional: Negative for chills and fever.  HENT: Negative for hearing loss.   Eyes: Negative for blurred vision and double vision.  Respiratory: Negative for cough and shortness of breath.   Cardiovascular: Negative for chest pain and palpitations.  Gastrointestinal: Positive for constipation. Negative for heartburn, nausea and vomiting.       GERD  Genitourinary: Negative for dysuria, flank pain and hematuria.  Musculoskeletal:  Positive for myalgias.  Neurological: Positive for headaches.  Psychiatric/Behavioral: Positive for depression. The patient has insomnia.        Anxiety  All other systems reviewed and are negative.   Past Medical History:  Diagnosis Date  . Arthritis      especially in shoulders  . Asthma    . Depression    . Diabetes mellitus    . GERD (gastroesophageal reflux disease)    . Headache(784.0)      "mild"  . Hypertension    . Mental disorder      depression  . Neuropathy      feet   . Pain      arthritis pain - takes tramadol as needed  . Rectal polyp      very little bleeding with bowel movements- no pain  . Suicide attempt [facility])           Past Surgical History:  Procedure Laterality Date  . ANAL RECTAL MANOMETRY N/A 07/13/2016    Procedure: ANO RECTAL MANOMETRY;  Surgeon: Leighton Ruff, MD;  Location: [facility];  Service: Endoscopy;  Laterality: N/A;  . CESAREAN SECTION      . EUS N/A 11/21/2012    Procedure: LOWER ENDOSCOPIC ULTRASOUND (EUS);  Surgeon: Arta Silence, MD;  Location: [facility];  Service: Endoscopy;  Laterality: N/A;  . FLEXIBLE SIGMOIDOSCOPY N/A 11/21/2012    Procedure: FLEXIBLE SIGMOIDOSCOPY;  Surgeon: Arta Silence, MD;  Location: [facility];  Service: Endoscopy;  Laterality: N/A;  . FLEXIBLE SIGMOIDOSCOPY N/A 01/28/2013    Procedure: FLEXIBLE SIGMOIDOSCOPY;  Surgeon: Leighton Ruff, MD;  Location: [facility];  Service: Endoscopy;  Laterality: N/A;  . LAPAROSCOPIC LOW ANTERIOR RESECTION N/A 01/29/2013    Procedure:  LAPAROSCOPIC LOW ANTERIOR RESECTION, Rigid Proctoscopy;  Surgeon: Leighton Ruff, MD;  Location: [facility];  Service: General;  Laterality: N/A;  . LAPAROSCOPIC SIGMOID COLECTOMY N/A 11/14/2012    Procedure: DIAGNOSTIC LAPAROSCOPY AND SIGMOIDMOIDOSCOPY ;  Surgeon: Rolm Bookbinder, MD;  Location: [facility];  Service: General;  Laterality: N/A;  . RECTAL ULTRASOUND N/A 07/13/2016    Procedure: RECTAL ULTRASOUND;  Surgeon: Leighton Ruff, MD;   Location: [facility];  Service: Endoscopy;  Laterality: N/A;  . TONSILLECTOMY      . TONSILLECTOMY AND ADENOIDECTOMY             Family History  Problem Relation Age of Onset  . Hypertension Father    . Stroke Father    . Diabetes Father    . Heart disease Mother    . Hypertension Mother    . Hyperlipidemia Mother    . Hypertension Sister    . Hypertension Brother    . Hypertension Sister    . Hypertension Brother    . Cancer Other 47        colon cancer   . Cancer Maternal Aunt          cancer, unknown type   . Cancer Maternal Uncle          bone cancer   . Cancer Paternal Aunt          lung cancer  . Cancer Paternal Uncle          lung cancer  . Cancer Maternal Uncle          colon cancer and prostate cancer   . Cancer Maternal Uncle          prostate cancer     Social History:  reports that she quit smoking about 2 years ago. Her smoking use included cigarettes. She has a 10.00 pack-year smoking history. She has never used smokeless tobacco. She reports previous alcohol use. She reports previous drug use. Drug: "Crack" cocaine. Allergies:       Allergies  Allergen Reactions  . Penicillins Hives, Itching and Swelling      Tongue swells up Has patient had a PCN reaction causing immediate rash, facial/tongue/throat swelling, SOB or lightheadedness with hypotension: Yes Has patient had a PCN reaction causing severe rash involving mucus membranes or skin necrosis: Yes Has patient had a PCN reaction that required hospitalization Yes Has patient had a PCN reaction occurring within the last 10 years: No If all of the above answers are "NO", then may proceed with Cephalosporin use.            Medications Prior to Admission  Medication Sig Dispense Refill  . acetaminophen (TYLENOL) 325 MG tablet Take 2 tablets (650 mg total) by mouth every 4 (four) hours as needed for mild pain (or temp > 37.5 C (99.5 F)).      Marland Kitchen albuterol (VENTOLIN HFA) 108 (90 Base) MCG/ACT inhaler  Inhale 2 puffs into the lungs every 4 (four) hours as needed for wheezing. 1 g 5  . amLODipine (NORVASC) 10 MG tablet Take 1 tablet (10 mg total) by mouth daily. 30 tablet 2  . BD PEN NEEDLE NANO 2ND GEN 32G X 4 MM MISC See admin instructions.      . blood glucose meter kit and supplies Dispense based on patient and insurance preference. Use up to four times daily as directed. (FOR ICD-10 E10.9, E11.9). 1 each 0  . blood glucose meter kit and supplies Dispense based on patient and insurance  preference. Use up to four times daily as directed. (FOR ICD-10 E10.9, E11.9). 1 each 0  . busPIRone (BUSPAR) 10 MG tablet Take 1 tablet (10 mg total) by mouth daily at 6 (six) AM. (Patient taking differently: Take 20 mg by mouth.) 30 tablet 0  . diclofenac Sodium (VOLTAREN) 1 % GEL Apply 2 g topically 4 (four) times daily. (Patient taking differently: Apply 2 g topically daily as needed (Knee pain).) 2 g 0  . famotidine (PEPCID) 20 MG tablet Take 1 tablet (20 mg total) by mouth daily. 30 tablet 0  . fenofibrate (TRICOR) 145 MG tablet TAKE 1 TABLET(145 MG) BY MOUTH DAILY (Patient taking differently: Take 145 mg by mouth daily.) 90 tablet 1  . gabapentin (NEURONTIN) 300 MG capsule Take 1 capsule (300 mg total) by mouth 3 (three) times daily. (Patient taking differently: Take 300 mg by mouth 3 (three) times daily as needed (pain).) 90 capsule 0  . hydrALAZINE (APRESOLINE) 25 MG tablet Take 1.5 tablets (37.5 mg total) by mouth every 8 (eight) hours. 90 tablet 0  . icosapent Ethyl (VASCEPA) 1 g capsule Take 2 capsules (2 g total) by mouth 2 (two) times daily. 360 capsule 0  . losartan-hydrochlorothiazide (HYZAAR) 50-12.5 MG tablet Take 1 tablet by mouth daily.      . metoprolol succinate (TOPROL-XL) 50 MG 24 hr tablet TAKE 1 TABLET(50 MG) BY MOUTH DAILY WITH OR IMMEDIATELY FOLLOWING A MEAL (Patient taking differently: Take 50 mg by mouth daily as needed (For blood pressure).) 90 tablet 3  . Multiple Vitamin  (MULTIVITAMIN ADULT PO) Take 1 tablet by mouth daily.      . Pirfenidone (ESBRIET) 267 MG TABS Take 3 tablets (801 mg total) by mouth in the morning, at noon, and at bedtime. 270 tablet 11  . rosuvastatin (CRESTOR) 20 MG tablet Take 20 mg by mouth daily.      . SYMBICORT 160-4.5 MCG/ACT inhaler Inhale 2 puffs into the lungs 2 (two) times daily. 1 each 5  . Tiotropium Bromide Monohydrate (SPIRIVA RESPIMAT) 1.25 MCG/ACT AERS Inhale 2 puffs into the lungs daily. 4 g 5  . TOUJEO SOLOSTAR 300 UNIT/ML Solostar Pen Inject 12 Units into the skin 2 (two) times daily. 50 units  in th A.M. and  40 units subcutaneous in the P.M. 1.5 mL 1  . traZODone (DESYREL) 50 MG tablet Take 50 mg by mouth at bedtime.      Marland Kitchen venlafaxine XR (EFFEXOR-XR) 75 MG 24 hr capsule Take 75 mg by mouth daily with breakfast.          Drug Regimen Review Drug regimen was reviewed and remains appropriate with no significant issues identified   Home: Home Living Family/patient expects to be discharged to:: Private residence Living Arrangements:  (son lives with her) Available Help at Discharge: Available 24 hours/day,Family Type of Home: Apartment Home Access: Level entry Home Layout: One level Bathroom Shower/Tub: Tub/shower unit,Curtain Biochemist, clinical: Handicapped height Bathroom Accessibility: Yes Home Equipment: Media planner - 2 wheels,Cane - single point,Bedside commode Additional Comments: Owns a shower chair and uses a shower hose  Lives With: Son   Functional History: Prior Function Level of Independence: Independent with assistive device(s) Comments: Ind wirh ADLs/selfcare, Uses RW for ambulation   Functional Status:  Mobility: Bed Mobility Overal bed mobility: Needs Assistance Bed Mobility: Supine to Sit,Sit to Supine Supine to sit: Min guard Sit to supine: Min guard General bed mobility comments: Min A for trunk elevation to come to sitting. Required min A  for LE assist for return to  supine. Transfers Overall transfer level: Needs assistance Equipment used: 1 person hand held assist,Rolling walker (2 wheeled) Transfers: Sit to/from Stand Sit to Stand: Min assist General transfer comment: Min A for steadying assist to stand. Ambulation/Gait Ambulation/Gait assistance: Mod assist Assistive device: 1 person hand held assist General Gait Details: Pt with increased L knee buckling during stance phase. Required manual blocking of L knee to take steps at EOB. Mod A for steadying.   ADL: ADL Overall ADL's : Needs assistance/impaired Eating/Feeding: Independent Grooming: Wash/dry hands,Wash/dry face,Min guard,Sitting Upper Body Bathing: Set up,Supervision/ safety,Sitting Lower Body Bathing: Minimal assistance,Min guard,Sit to/from stand Upper Body Dressing : Set up,Supervision/safety,Sitting Lower Body Dressing: Minimal assistance,Sit to/from stand Toilet Transfer: Minimal assistance,Ambulation,RW,Regular Toilet,Grab bars Toileting- Clothing Manipulation and Hygiene: Min guard,Sit to/from stand Functional mobility during ADLs: Minimal assistance,Min guard,Rolling walker General ADL Comments: min A for initial stand from EOB with RW   Cognition: Cognition Overall Cognitive Status: Impaired/Different from baseline Arousal/Alertness: Awake/alert Orientation Level: Oriented X4 Attention: Selective Selective Attention: Impaired Selective Attention Impairment: Verbal basic Memory: Impaired Memory Impairment: Retrieval deficit Awareness: Impaired Awareness Impairment: Emergent impairment Problem Solving: Impaired Problem Solving Impairment: Verbal basic Executive Function: Initiating Initiating: Impaired Cognition Arousal/Alertness: Awake/alert Behavior During Therapy: WFL for tasks assessed/performed Overall Cognitive Status: Impaired/Different from baseline   Physical Exam: Blood pressure (!) 165/81, pulse 84, temperature 98.4 F (36.9 C), temperature source  Oral, resp. rate 18, height _0  (1.6 m), weight 83.3 kg, SpO2 97 %. Physical Exam Constitutional:      General: She is not in acute distress.    Appearance: Normal appearance.  HENT:     Head: Normocephalic and atraumatic.     Right Ear: External ear normal.     Left Ear: External ear normal.     Nose: Nose normal.     Mouth/Throat:     Mouth: Mucous membranes are moist.  Eyes:     Pupils: Pupils are equal, round, and reactive to light.  Cardiovascular:     Rate and Rhythm: Normal rate and regular rhythm.     Pulses: Normal pulses.     Heart sounds: Normal heart sounds.  Pulmonary:     Effort: Pulmonary effort is normal.     Breath sounds: Normal breath sounds.  Abdominal:     General: Bowel sounds are normal. There is no distension.     Tenderness: There is no abdominal tenderness.  Skin:    General: Skin is warm.  Neurological:     Mental Status: She is alert.     Comments: Patient is alert in no acute distress.  Makes eye contact with examiner.  Oriented to person and place.  Follows commands.  RUE and RLE 4/5. LUE 3+ to 4/5 with decreased [place], LLE 4-/5. Sensation 1/2 LUE, 1+/2 LLE. Fair sitting balance. Reasonable insight and awarenes.    Psychiatric:        Mood and Affect: Mood normal.        Behavior: Behavior normal.        Lab Results Last 48 Hours        Results for orders placed or performed during the hospital encounter of 11/03/20 (from the past 48 hour(s))  CBG monitoring, ED     Status: None    Collection Time: 11/04/20 12:47 PM  Result Value Ref Range    Glucose-Capillary 87 70 - 99 mg/dL      Comment: Glucose reference range applies only to samples  taken after fasting for at least 8 hours.  CBG monitoring, ED     Status: None    Collection Time: 11/04/20  5:09 PM  Result Value Ref Range    Glucose-Capillary 82 70 - 99 mg/dL      Comment: Glucose reference range applies only to samples taken after fasting for at least 8 hours.  Glucose, capillary      Status: Abnormal    Collection Time: 11/04/20  9:40 PM  Result Value Ref Range    Glucose-Capillary 193 (H) 70 - 99 mg/dL      Comment: Glucose reference range applies only to samples taken after fasting for at least 8 hours.  Basic metabolic panel     Status: Abnormal    Collection Time: 11/05/20  3:04 AM  Result Value Ref Range    Sodium 140 135 - 145 mmol/L    Potassium 3.1 (L) 3.5 - 5.1 mmol/L    Chloride 113 (H) 98 - 111 mmol/L    CO2 20 (L) 22 - 32 mmol/L    Glucose, Bld 98 70 - 99 mg/dL      Comment: Glucose reference range applies only to samples taken after fasting for at least 8 hours.    BUN 17 8 - 23 mg/dL    Creatinine, Ser 1.63 (H) 0.44 - 1.00 mg/dL    Calcium 8.7 (L) 8.9 - 10.3 mg/dL    GFR, Estimated 35 (L) >60 mL/min      Comment: (NOTE) Calculated using the CKD-EPI Creatinine Equation (2021)      Anion gap 7 5 - 15      Comment: Performed at [facility] [address] [place], [place] [address]  CBC with Differential/Platelet     Status: Abnormal    Collection Time: 11/05/20  3:04 AM  Result Value Ref Range    WBC 3.5 (L) 4.0 - 10.5 K/uL    RBC 2.83 (L) 3.87 - 5.11 MIL/uL    Hemoglobin 9.2 (L) 12.0 - 15.0 g/dL    HCT 25.7 (L) 36.0 - 46.0 %    MCV 90.8 80.0 - 100.0 fL    MCH 32.5 26.0 - 34.0 pg    MCHC 35.8 30.0 - 36.0 g/dL    RDW 15.9 (H) 11.5 - 15.5 %    Platelets 140 (L) 150 - 400 K/uL      Comment: REPEATED TO VERIFY    nRBC 0.0 0.0 - 0.2 %    Neutrophils Relative % 58 %    Neutro Abs 2.1 1.7 - 7.7 K/uL    Lymphocytes Relative 31 %    Lymphs Abs 1.1 0.7 - 4.0 K/uL    Monocytes Relative 8 %    Monocytes Absolute 0.3 0.1 - 1.0 K/uL    Eosinophils Relative 2 %    Eosinophils Absolute 0.1 0.0 - 0.5 K/uL    Basophils Relative 0 %    Basophils Absolute 0.0 0.0 - 0.1 K/uL    Immature Granulocytes 1 %    Abs Immature Granulocytes 0.02 0.00 - 0.07 K/uL      Comment: Performed at [facility], [place] [address], [place], [place] [address]   Magnesium     Status: None    Collection Time: 11/05/20  3:04 AM  Result Value Ref Range    Magnesium 1.7 1.7 - 2.4 mg/dL      Comment: Performed at [facility] [address] [place], [place] [address]  Glucose, capillary  Status: None    Collection Time: 11/05/20  6:00 AM  Result Value Ref Range    Glucose-Capillary 88 70 - 99 mg/dL      Comment: Glucose reference range applies only to samples taken after fasting for at least 8 hours.  Vitamin B12     Status: None    Collection Time: 11/05/20  9:53 AM  Result Value Ref Range    Vitamin B-12 737 180 - 914 pg/mL      Comment: (NOTE) This assay is not validated for testing neonatal or myeloproliferative syndrome specimens for Vitamin B12 levels. Performed at [facility], [place] [address] [place], [place] [address]    Folate     Status: None    Collection Time: 11/05/20  9:53 AM  Result Value Ref Range    Folate 38.9 >5.9 ng/mL      Comment: Performed at [facility] [address], [place], [place] [address]  Glucose, capillary     Status: Abnormal    Collection Time: 11/05/20 11:37 AM  Result Value Ref Range    Glucose-Capillary 183 (H) 70 - 99 mg/dL      Comment: Glucose reference range applies only to samples taken after fasting for at least 8 hours.  Glucose, capillary     Status: Abnormal    Collection Time: 11/05/20  4:15 PM  Result Value Ref Range    Glucose-Capillary 194 (H) 70 - 99 mg/dL      Comment: Glucose reference range applies only to samples taken after fasting for at least 8 hours.    Comment 1 Notify RN    Glucose, capillary     Status: Abnormal    Collection Time: 11/05/20  9:45 PM  Result Value Ref Range    Glucose-Capillary 127 (H) 70 - 99 mg/dL      Comment: Glucose reference range applies only to samples taken after fasting for at least 8 hours.  Basic metabolic panel     Status: Abnormal    Collection Time: 11/06/20  2:30 AM  Result Value Ref Range    Sodium 140 135 - 145  mmol/L    Potassium 3.9 3.5 - 5.1 mmol/L    Chloride 114 (H) 98 - 111 mmol/L    CO2 21 (L) 22 - 32 mmol/L    Glucose, Bld 124 (H) 70 - 99 mg/dL      Comment: Glucose reference range applies only to samples taken after fasting for at least 8 hours.    BUN 16 8 - 23 mg/dL    Creatinine, Ser 1.71 (H) 0.44 - 1.00 mg/dL    Calcium 8.9 8.9 - 10.3 mg/dL    GFR, Estimated 33 (L) >60 mL/min      Comment: (NOTE) Calculated using the CKD-EPI Creatinine Equation (2021)      Anion gap 5 5 - 15      Comment: Performed at [facility] [address], [place] [address]  CBC with Differential/Platelet     Status: Abnormal    Collection Time: 11/06/20  2:30 AM  Result Value Ref Range    WBC 3.3 (L) 4.0 - 10.5 K/uL    RBC 2.99 (L) 3.87 - 5.11 MIL/uL    Hemoglobin 9.7 (L) 12.0 - 15.0 g/dL    HCT 27.0 (L) 36.0 - 46.0 %    MCV 90.3 80.0 - 100.0 fL    MCH 32.4 26.0 - 34.0 pg    MCHC 35.9 30.0 -  36.0 g/dL    RDW 16.1 (H) 11.5 - 15.5 %    Platelets 139 (L) 150 - 400 K/uL      Comment: REPEATED TO VERIFY    nRBC 0.0 0.0 - 0.2 %    Neutrophils Relative % 62 %    Neutro Abs 2.0 1.7 - 7.7 K/uL    Lymphocytes Relative 29 %    Lymphs Abs 0.9 0.7 - 4.0 K/uL    Monocytes Relative 6 %    Monocytes Absolute 0.2 0.1 - 1.0 K/uL    Eosinophils Relative 2 %    Eosinophils Absolute 0.1 0.0 - 0.5 K/uL    Basophils Relative 0 %    Basophils Absolute 0.0 0.0 - 0.1 K/uL    Immature Granulocytes 1 %    Abs Immature Granulocytes 0.02 0.00 - 0.07 K/uL      Comment: Performed at [facility] [address], [place], [place] [address]  Magnesium     Status: None    Collection Time: 11/06/20  2:30 AM  Result Value Ref Range    Magnesium 1.9 1.7 - 2.4 mg/dL      Comment: Performed at [facility] [address], [place], [place] [address]  Glucose, capillary     Status: Abnormal    Collection Time: 11/06/20  6:12 AM  Result Value Ref Range    Glucose-Capillary 108 (H) 70 - 99 mg/dL       Comment: Glucose reference range applies only to samples taken after fasting for at least 8 hours.  Glucose, capillary     Status: Abnormal    Collection Time: 11/06/20 11:08 AM  Result Value Ref Range    Glucose-Capillary 129 (H) 70 - 99 mg/dL      Comment: Glucose reference range applies only to samples taken after fasting for at least 8 hours.       Imaging Results (Last 48 hours)  ECHOCARDIOGRAM COMPLETE   Result Date: 11/04/2020    ECHOCARDIOGRAM REPORT   Patient Name:   Erica [name] Date of Exam: 11/04/2020 Medical Rec #:  [id number]    Height:       63.0 in Accession #:    [id number]   Weight:       183.6 lb Date of Birth:  August 16, 1955    BSA:          1.865 m Patient Age:    15 years     BP:           157/76 mmHg Patient Gender: F            HR:           50 bpm. Exam Location:  Inpatient Procedure: 2D Echo, Cardiac Doppler and Color Doppler Indications:    CVA  History:        Patient has prior history of Echocardiogram examinations, most                 recent 06/26/2020. Risk Factors:Hypertension and Diabetes.  Sonographer:    Luisa Hart RDCS Referring Phys: [id number] [place]  1. Left ventricular ejection fraction, by estimation, is 60 to 65%. The left ventricle has normal function. The left ventricle has no regional wall motion abnormalities. There is moderate concentric left ventricular hypertrophy. Left ventricular diastolic parameters are consistent with Grade I diastolic dysfunction (impaired relaxation). Elevated left atrial pressure.  2. Right ventricular systolic function is normal. The right ventricular size is normal.  3. The mitral valve is normal in structure. No evidence of mitral valve regurgitation. No evidence of mitral stenosis.  4. The aortic valve is tricuspid. There is mild calcification of the aortic valve. There is mild thickening of the aortic valve. Aortic valve regurgitation is mild. Mild aortic valve sclerosis is present, with no evidence of aortic  valve stenosis.  5. The inferior vena cava is normal in size with greater than 50% respiratory variability, suggesting right atrial pressure of 3 mmHg. Comparison(s): No significant change from prior study. Prior images reviewed side by side. FINDINGS  Left Ventricle: Left ventricular ejection fraction, by estimation, is 60 to 65%. The left ventricle has normal function. The left ventricle has no regional wall motion abnormalities. The left ventricular internal cavity size was normal in size. There is  moderate concentric left ventricular hypertrophy. Left ventricular diastolic parameters are consistent with Grade I diastolic dysfunction (impaired relaxation). Elevated left atrial pressure. Right Ventricle: The right ventricular size is normal. No increase in right ventricular wall thickness. Right ventricular systolic function is normal. Left Atrium: Left atrial size was normal in size. Right Atrium: Right atrial size was normal in size. Pericardium: There is no evidence of pericardial effusion. Mitral Valve: The mitral valve is normal in structure. There is mild thickening of the mitral valve leaflet(s). No evidence of mitral valve regurgitation. No evidence of mitral valve stenosis. MV peak gradient, 5.4 mmHg. The mean mitral valve gradient is  1.0 mmHg. Tricuspid Valve: The tricuspid valve is normal in structure. Tricuspid valve regurgitation is not demonstrated. No evidence of tricuspid stenosis. Aortic Valve: The aortic valve is tricuspid. There is mild calcification of the aortic valve. There is mild thickening of the aortic valve. Aortic valve regurgitation is mild. Aortic regurgitation PHT measures 754 msec. Mild aortic valve sclerosis is present, with no evidence of aortic valve stenosis. Aortic valve mean gradient measures 4.0 mmHg. Aortic valve peak gradient measures 8.1 mmHg. Aortic valve area, by VTI measures 1.57 cm. Pulmonic Valve: The pulmonic valve was normal in structure. Pulmonic valve  regurgitation is not visualized. No evidence of pulmonic stenosis. Aorta: The aortic root is normal in size and structure. Venous: The inferior vena cava is normal in size with greater than 50% respiratory variability, suggesting right atrial pressure of 3 mmHg. IAS/Shunts: No atrial level shunt detected by color flow Doppler.  LEFT VENTRICLE PLAX 2D LVIDd:         4.10 cm     Diastology LVIDs:         2.50 cm     LV e' medial:    3.73 cm/s LV PW:         1.50 cm     LV E/e' medial:  18.3 LV IVS:        1.50 cm     LV e' lateral:   4.54 cm/s LVOT diam:     1.80 cm     LV E/e' lateral: 15.0 LV SV:         56 LV SV Index:   30 LVOT Area:     2.54 cm  LV Volumes (MOD) LV vol d, MOD A2C: 33.1 ml LV vol d, MOD A4C: 54.0 ml LV vol s, MOD A2C: 17.1 ml LV vol s, MOD A4C: 14.4 ml LV SV MOD A2C:     16.0 ml LV SV MOD A4C:     54.0 ml LV SV MOD BP:      27.8 ml RIGHT VENTRICLE RV Basal diam:  3.40 cm     PULMONARY VEINS RV Mid diam:    2.20 cm     A Reversal Duration: 113.00 msec RV S prime:     16.50 cm/s  A Reversal Velocity: 27.70 cm/s TAPSE (M-mode): 2.4 cm      Diastolic Velocity:  06.00 cm/s                             S/D Velocity:        1.30                             Systolic Velocity:   45.99 cm/s LEFT ATRIUM             Index LA diam:        2.90 cm 1.56 cm/m LA Vol (A2C):   53.3 ml 28.59 ml/m LA Vol (A4C):   51.4 ml 27.57 ml/m LA Biplane Vol: 55.2 ml 29.60 ml/m  AORTIC VALVE                   PULMONIC VALVE AV Area (Vmax):    1.58 cm    PV Vmax:       0.72 m/s AV Area (Vmean):   1.64 cm    PV Vmean:      57.200 cm/s AV Area (VTI):     1.57 cm    PV VTI:        0.209 m AV Vmax:           142.00 cm/s PV Peak grad:  2.0 mmHg AV Vmean:          88.100 cm/s PV Mean grad:  1.0 mmHg AV VTI:            0.359 m AV Peak Grad:      8.1 mmHg AV Mean Grad:      4.0 mmHg LVOT Vmax:         88.40 cm/s LVOT Vmean:        56.700 cm/s LVOT VTI:          0.222 m LVOT/AV VTI ratio: 0.62 AI PHT:            754 msec  AORTA Ao  Root diam: 3.50 cm Ao Asc diam:  3.40 cm MITRAL VALVE MV Area (PHT): 3.17 cm    SHUNTS MV Area VTI:   1.46 cm    Systemic VTI:  0.22 m MV Peak grad:  5.4 mmHg    Systemic Diam: 1.80 cm MV Mean grad:  1.0 mmHg MV Vmax:       1.16 m/s MV Vmean:      50.8 cm/s MV Decel Time: 239 msec MV E velocity: 68.10 cm/s MV A velocity: 99.80 cm/s MV E/A ratio:  0.68 Mihai Croitoru MD Electronically signed by Sanda Klein MD Signature Date/Time: 11/04/2020/3:40:47 PM    Final     VAS US CAROTID (at [facility] and [facility] only)   Result Date: 11/05/2020 Carotid Arterial Duplex Study Patient Name:  Erica [name]  Date of Exam:   11/05/2020 Medical Rec #: [id number]     Accession #:    [id number] Date of Birth: 05/19/1956     Patient Gender: F Patient Age:   65Y Exam Location:  [facility] Procedure:      VAS US CAROTID Referring Phys: [id number] TIMOTHY S OPYD --------------------------------------------------------------------------------  Indications:  CVA. Risk Factors:      Hypertension, Diabetes, past history of smoking, coronary                    artery disease, prior CVA. Other Factors:     COPD, CKD3. Comparison Study:  Previous exam 06/26/20 - WNL Performing Technologist: Rogelia Rohrer  Examination Guidelines: A complete evaluation includes B-mode imaging, spectral Doppler, color Doppler, and power Doppler as needed of all accessible portions of each vessel. Bilateral testing is considered an integral part of a complete examination. Limited examinations for reoccurring indications may be performed as noted.  Right Carotid Findings: +----------+--------+--------+--------+------------------+------------------+           PSV cm/sEDV cm/sStenosisPlaque DescriptionComments           +----------+--------+--------+--------+------------------+------------------+ CCA Prox  54      12                                intimal thickening +----------+--------+--------+--------+------------------+------------------+ CCA  Distal39      14                                intimal thickening +----------+--------+--------+--------+------------------+------------------+ ICA Prox  42      15                                intimal thickening +----------+--------+--------+--------+------------------+------------------+ ICA Distal70      20                                                   +----------+--------+--------+--------+------------------+------------------+ ECA       27      6                                                    +----------+--------+--------+--------+------------------+------------------+ +----------+--------+-------+--------+-------------------+           PSV cm/sEDV cmsDescribeArm Pressure (mmHG) +----------+--------+-------+--------+-------------------+ [id number]                                         +----------+--------+-------+--------+-------------------+ +---------+--------+--+--------+--+---------+ VertebralPSV cm/s40EDV cm/s11Antegrade +---------+--------+--+--------+--+---------+  Left Carotid Findings: +----------+--------+--------+--------+------------------+------------------+           PSV cm/sEDV cm/sStenosisPlaque DescriptionComments           +----------+--------+--------+--------+------------------+------------------+ CCA Prox  58      16                                intimal thickening +----------+--------+--------+--------+------------------+------------------+ CCA Distal50      15                                intimal thickening +----------+--------+--------+--------+------------------+------------------+ ICA Prox  43      16  intimal thickening +----------+--------+--------+--------+------------------+------------------+ ICA Distal50      19                                                   +----------+--------+--------+--------+------------------+------------------+ ECA       25       6                                                    +----------+--------+--------+--------+------------------+------------------+ +----------+--------+--------+--------+-------------------+           PSV cm/sEDV cm/sDescribeArm Pressure (mmHG) +----------+--------+--------+--------+-------------------+ [id number]                                          +----------+--------+--------+--------+-------------------+ +---------+--------+--+--------+--+---------+ VertebralPSV cm/s36EDV cm/s11Antegrade +---------+--------+--+--------+--+---------+   Summary: Right Carotid: The extracranial vessels were near-normal with only minimal wall                thickening or plaque. Left Carotid: The extracranial vessels were near-normal with only minimal wall               thickening or plaque. Vertebrals: Bilateral vertebral arteries demonstrate antegrade flow. *See table(s) above for measurements and observations.  Electronically signed by Servando Snare MD on 11/05/2020 at 6:01:44 PM.    Final          BP (!) 165/81 (BP Location: Right Arm)   Pulse 84   Temp 98.4 F (36.9 C) (Oral)   Resp 18   Ht 5' 3" (1.6 m)   Wt 83.3 kg   SpO2 97%   BMI 32.52 kg/m     Medical Problem List and Plan: 1.  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  Patient declined  loop recorder             -patient may  shower             -ELOS/Goals: mod I to supervision, 7-9 days 2.  Antithrombotics: -DVT/anticoagulation: Subcutaneous heparin             -antiplatelet therapy: Aspirin 81 mg daily and Plavix 75 mg daily x 3 weeks then aspirin alone 3. Pain Management: Voltaren gel as needed Neurontin 300 mg twice daily as needed 4. Mood: Effexor 75 mg daily, BuSpar 10 mg twice daily, trazodone 50 mg nightly             -antipsychotic agents: N/A 5. Neuropsych: This patient is capable of making decisions on her own  behalf. 6. Skin/Wound Care: Routine skin checks 7. Fluids/Electrolytes/Nutrition: Routine in and outs with follow-up chemistries 8.  Hypertension.  Cozaar 50 mg daily, Norvasc 5 mg daily             -borderline control at present 9.  Idiopathic pulmonary fibrosis/COPD.Pirfenidone 801 mg 3 times daily, continue inhalers 10.  Diabetes mellitus.  Lantus insulin 25 units twice daily.  Check blood sugars before meals and at bedtime             -adjust insulin as necessary 11.  CKD stage III.  Follow-up chemistries 12.  History of rectal cancer.  Follow-up outpatient 13.  Hyperlipidemia.  Crestor 14.  GERD.  Pepcid     Lavon Paganini Angiulli, PA-C 11/06/2020  I have personally performed a face to face diagnostic evaluation of this patient and formulated the key components of the plan.  Additionally, I have personally reviewed laboratory data, imaging studies, as well as relevant notes and concur with the physician assistant's documentation above.  The patient's status has not changed from the original H&P.  Any changes in documentation from the acute care chart have been noted above.  Meredith Staggers, MD, Mellody Drown

## 2020-11-06 NOTE — Progress Notes (Signed)
PROGRESS NOTE  Erica [name] GYB:[id number] DOB: 12/16/1955   PCP: Nolene Ebbs, MD  Patient is from: Home  DOA: 11/03/2020 LOS: 2  Chief complaints: Left hand and face numbness, LLE weakness  Brief Narrative / Interim history: 65 year old F with PMH of recent right thalamic CVA in 06/2020, cervical hydrosyringomyelia, COPD, pulmonary fibrosis, CAD, rectal cancer, CKD-3B, DM-2 and depression presenting with 3 days of left hand and face numbness and LLE weakness for 3 days, and right-sided headache that she describes as "brain freeze" for about 4 weeks.  In ED, hypertensive to 180/80.  Bradycardic to 40s.  MRI brain demonstrated new acute infarction at the medial right parieto-occipital junction in addition to chronic small vessel ischemic changes.  Neurology consulted.    Patient has had routine CVA work-up with MRA head, carotid Dopplers and echocardiogram without significant finding.  MRI cervical spine was done and showed stable and persistent patchy signal abnormality involving the central and dorsal aspect of cervical spinal cord extending from C2 through C7-T1.  Neurology recommended DAPT for 3 weeks followed by aspirin alone.  Therapy recommended CIR. Patient is medically stable for transfer to CIR once bed available.  Subjective: Seen and examined earlier this morning.  No major events overnight of this morning.  No complaints.  Still with numbness in her face and left arm but improved.  Still with some weakness in right lower extremity.  She denies headache, vision change, chest pain, dyspnea, GI or UTI symptoms.  Objective: Vitals:   11/05/20 2040 11/06/20 0023 11/06/20 0509 11/06/20 0820  BP: (!) 143/70 (!) 144/73 (!) 159/91 (!) 159/73  Pulse: 65 64 61 (!) 59  Resp: _0 Temp: 98.6 F (37 C) 98.2 F (36.8 C) 98.5 F (36.9 C) 98.3 F (36.8 C)  TempSrc: Oral Oral Oral Oral  SpO2: 100% 100% 100% 99%  Weight:      Height:        Intake/Output Summary (Last 24 hours)  at 11/06/2020 1205 Last data filed at 11/05/2020 1723 Gross per 24 hour  Intake 480 ml  Output --  Net 480 ml   Filed Weights   11/03/20 1537  Weight: 83.3 kg    Examination:  GENERAL: No apparent distress.  Nontoxic. HEENT: MMM.  Vision and hearing grossly intact.  NECK: Supple.  No apparent JVD.  RESP: On RA.  No IWOB.  Fair aeration bilaterally. CVS:  RRR. Heart sounds normal.  ABD/GI/GU: BS+. Abd soft, NTND.  MSK/EXT:  Moves extremities. No apparent deformity. No edema.  SKIN: no apparent skin lesion or wound NEURO: Awake, alert and oriented appropriately. Speech clear. Cranial nerves II-XII grossly intact.  Motor 4/5 in RLE and 5/5 elsewhere.  Diminished light sensation in left upper extremity.  Patellar reflex symmetric.  No pronator drift.  Finger to nose slightly off on the left. PSYCH: Calm. Normal affect.  Procedures:  None  Microbiology summarized: AJGOT-15 PCR nonreactive  Assessment & Plan: Acute right parieto-occipital CVA-at least 3 days of symptoms prior to arrival.  Outside tPA window.  Still with some residual numbness in left face and left arm.  Not sure about the cause of a LLE weakness.  TTE with LVEF of 60 to 65% and G1 DD but no thrombus.  MRA and carotid US without flow obstructing stenosis. -Loop recorder recommended prior to discharge per neurology -DAPT with Plavix and aspirin for 3 weeks followed by aspirin alone -Continue statin -Discharged to CIR once bed available.  Hydrosyringomyelia: MRI  C-spine  showed stable and unchanged patchy persistent signal abnormality extending from C2 through C7-T1 -Outpatient follow-up with neurosurgery  Hypertension: BP slightly elevated. Chronic diastolic CHF: TTE with LVEF of 60-65% and G1 DD and elevated left atrial pressure.  Euvolemic -Slowly normalize BP -Continue home losartan -At home amlodipine at lower dose -Continue holding metoprolol given bradycardia. -Continue holding HCTZ  CKD IIIb:  Stable. Recent Labs    06/30/20 0513 07/01/20 0705 07/03/20 0529 07/06/20 0514 07/07/20 0534 07/10/20 0748 07/14/20 0448 07/29/20 0000 11/03/20 1632 11/04/20 0418 11/05/20 0304 11/06/20 0230  BUN 30* 27* 35* 30*  --  27*  --  _0 CREATININE 1.74* 1.91* 1.87* 1.71* 1.80* 1.78* 1.91* 1.53* 1.80* 1.70* 1.63* 1.71*  -Monitor intermittently  Chronic COPD/interstitial pulmonary fibrosis: No cardiopulmonary symptoms. -Continuepirfenidone,ICS/LABA, Spiriva, and as-needed albuterol  Anxiety and depression: Stable -Stable, continue home regimen  Sinus bradycardia: Rate upper 40s to 50s in ED.  Likely due to beta-blocker.  Improved. - Hold metoprolol  Controlled IDDM-2 with hyperglycemia and other complications: S2L 9.5%. Recent Labs  Lab 11/05/20 1137 11/05/20 1615 11/05/20 2145 11/06/20 0612 11/06/20 1108  GLUCAP 183* 194* 127* 108* 129*  -Continue Lantus 25 units twice daily -Continue SSI-sensitive -Continue statin  Pancytopenia-has mild leukopenia without neutropenia, anemia and thrombocytopenia.  Platelet clumping noted on smears on 5/25.  Overall, anemia and thrombocytopenia seems to be at baseline. -Continue monitoring -Check anemia panel in the morning  Class I obesity Body mass index is 32.52 kg/m.         DVT prophylaxis:  heparin injection 5,000 Units Start: 11/03/20 2200  Code Status: Full code Family Communication: Patient and/or RN. Available if any question.  Level of care: Telemetry Medical Status is: Inpatient  Remains inpatient appropriate because:Unsafe d/c plan   Dispo: The patient is from: Home              Anticipated d/c is to: Home              Patient currently is medically stable to d/c.   Difficult to place patient No       Consultants:  Neurology-signed off   Sch Meds:  Scheduled Meds: .  stroke: mapping our early stages of recovery book   Does not apply Once  . amLODipine  5 mg Oral Daily  .  aspirin EC  81 mg Oral Daily  . busPIRone  10 mg Oral BID  . clopidogrel  75 mg Oral Daily  . famotidine  20 mg Oral Daily  . heparin  5,000 Units Subcutaneous Q8H  . insulin aspart  0-5 Units Subcutaneous QHS  . insulin aspart  0-9 Units Subcutaneous TID WC  . insulin glargine  25 Units Subcutaneous BID  . losartan  50 mg Oral Daily  . mometasone-formoterol  2 puff Inhalation BID  . Pirfenidone  3 tablet Oral TID  . rosuvastatin  20 mg Oral Daily  . traZODone  50 mg Oral QHS  . umeclidinium bromide  1 puff Inhalation Daily  . venlafaxine XR  75 mg Oral Q breakfast   Continuous Infusions: PRN Meds:.acetaminophen **OR** acetaminophen (TYLENOL) oral liquid 160 mg/5 mL **OR** acetaminophen, albuterol, diclofenac Sodium, gabapentin, hydrALAZINE, labetalol, senna-docusate  Antimicrobials: Anti-infectives (From admission, onward)   None       I have personally reviewed the following labs and images: CBC: Recent Labs  Lab 11/03/20 1632 11/04/20 0418 11/05/20 0304 11/06/20 0230  WBC 5.1 5.0 3.5* 3.3*  NEUTROABS 3.4  --  2.1 2.0  HGB 10.5* 9.2* 9.2* 9.7*  HCT 29.8* 26.2* 25.7* 27.0*  MCV 92.5 93.6 90.8 90.3  PLT 147* PLATELET CLUMPS NOTED ON SMEAR, UNABLE TO ESTIMATE 140* 139*   BMP &GFR Recent Labs  Lab 11/03/20 1632 11/04/20 0418 11/05/20 0304 11/06/20 0230  NA 140 141 140 140  K 4.0 3.5 3.1* 3.9  CL 113* 113* 113* 114*  CO2 21* 21* 20* 21*  GLUCOSE 98 100* 98 124*  BUN _0 CREATININE 1.80* 1.70* 1.63* 1.71*  CALCIUM 9.2 8.8* 8.7* 8.9  MG  --   --  1.7 1.9   Estimated Creatinine Clearance: 33.1 mL/min (A) (by C-G formula based on SCr of 1.71 mg/dL (H)). Liver & Pancreas: Recent Labs  Lab 11/03/20 1632 11/04/20 0418  AST 44* 46*  ALT 33 32  ALKPHOS 45 38  BILITOT 1.3* 1.3*  PROT 6.9 6.9  ALBUMIN 3.4* 3.3*   No results for input(s): LIPASE, AMYLASE in the last 168 hours. No results for input(s): AMMONIA in the last 168 hours. Diabetic: Recent  Labs    11/04/20 0418  HGBA1C 6.3*   Recent Labs  Lab 11/05/20 1137 11/05/20 1615 11/05/20 2145 11/06/20 0612 11/06/20 1108  GLUCAP 183* 194* 127* 108* 129*   Cardiac Enzymes: No results for input(s): CKTOTAL, CKMB, CKMBINDEX, TROPONINI in the last 168 hours. No results for input(s): PROBNP in the last 8760 hours. Coagulation Profile: No results for input(s): INR, PROTIME in the last 168 hours. Thyroid Function Tests: No results for input(s): TSH, T4TOTAL, FREET4, T3FREE, THYROIDAB in the last 72 hours. Lipid Profile: Recent Labs    11/04/20 0418  CHOL 131  HDL 58  LDLCALC 56  TRIG 87  CHOLHDL 2.3   Anemia Panel: Recent Labs    11/05/20 0953  VITAMINB12 737  FOLATE 38.9   Urine analysis:    Component Value Date/Time   COLORURINE YELLOW 11/03/2020 1638   APPEARANCEUR CLOUDY (A) 11/03/2020 1638   LABSPEC 1.011 11/03/2020 1638   PHURINE 5.0 11/03/2020 [place] 11/03/2020 1638   [place] 11/03/2020 [place] 11/03/2020 [place] 11/03/2020 1638   PROTEINUR 30 (A) 11/03/2020 1638   NITRITE NEGATIVE 11/03/2020 1638   LEUKOCYTESUR LARGE (A) 11/03/2020 1638   Sepsis Labs: Invalid input(s): PROCALCITONIN, [place]  Microbiology: Recent Results (from the past 240 hour(s))  SARS CORONAVIRUS 2 (TAT 6-24 HRS) Nasopharyngeal Nasopharyngeal Swab     Status: None   Collection Time: 11/04/20 12:57 AM   Specimen: Nasopharyngeal Swab  Result Value Ref Range Status   SARS Coronavirus 2 NEGATIVE NEGATIVE Final    Comment: (NOTE) SARS-CoV-2 target nucleic acids are NOT DETECTED.  The SARS-CoV-2 RNA is generally detectable in upper and lower respiratory specimens during the acute phase of infection. Negative results do not preclude SARS-CoV-2 infection, do not rule out co-infections with other pathogens, and should not be used as the sole basis for treatment or other patient management decisions. Negative  results must be combined with clinical observations, patient history, and epidemiological information. The expected result is Negative.  Fact Sheet for Patients: SugarRoll.be  Fact Sheet for Healthcare Providers: https://www.woods-mathews.com/  This test is not yet approved or cleared by the [place] FDA and  has been authorized for detection and/or diagnosis of SARS-CoV-2 by FDA under an Emergency Use Authorization (EUA). This EUA will remain  in effect (meaning this test can be used) for the duration of the COVID-19  declaration under Se ction 564(b)(1) of the Act, 21 U.S.C. section 360bbb-3(b)(1), unless the authorization is terminated or revoked sooner.  Performed at [facility], [place] [address], [place], [place] [address]     Radiology Studies: No results found.     Luccas Towell T. Nelson  If 7PM-7AM, please contact night-coverage [email or web address] 11/06/2020, 12:05 PM

## 2020-11-06 NOTE — Progress Notes (Addendum)
Inpatient Rehabilitation Admissions Coordinator  I met with patient at bedside . I have insurance approval for CIR admit, but no bed immediately available. I will arrange for everything to be ready for admit, in case bed unexpectedly becomes available between today and Monday. Patient is in agreement. I have alerted acute team and TOC.  Danne Baxter, RN, MSN Rehab Admissions Coordinator [phone] 11/06/2020 11:22 AM   There is a possible CIR bed available today. I have alerted acute team to include [facility] and cardiology. I will follow up today.  Danne Baxter, RN, MSN Rehab Admissions Coordinator [phone] 11/06/2020 1:19 PM

## 2020-11-06 NOTE — Progress Notes (Signed)
Inpatient Rehabilitation Admissions Coordinator  CIR bed is available to admit today. I met with patient and her sister at bedside and they are in agreement. I will make the arrangements to admit today.  Danne Baxter, RN, MSN Rehab Admissions Coordinator [phone] 11/06/2020 2:34 PM

## 2020-11-06 NOTE — Progress Notes (Signed)
encounter: ED to Hosp-Admission (Discharged) from 11/03/2020 in [place] [facility]       Signed          Show:Clear all _0 Manual_1 Template_2 Copied  Added by: _3 Cristina Gong, RN   _4 Hover for details  PMR Admission Coordinator Pre-Admission Assessment   Patient: Erica [name] is an 65 y.o., female MRN: [id number] DOB: 1955/11/13 Height: 5' 3" (160 cm) Weight: 83.3 kg                                                                                                                                                  Insurance Information   PRIMARY: [employer]      Policy#: [id number]      Subscriber: pt CM Name: Erica [name]     Phone#: [phone] ext [phone]     Fax#: [phone] Pre-Cert#: [id number] approved for 7 days with f/u Erica [name] ext [phone] same fax     Benefits:  Phone #: [phone]     Name: 5/26 Eff. Date: 06/13/2016     Deduct: $233      Out of Pocket Max: $3450      Life Max: none  CIR: $2524 per admit dual medicaid policy      SNF: no copay days 1 until 20; $188 co pay per day days 24 until 41; no copay days 42 until 100 Outpatient: 80%     Co-Pay: visits per medical neccesity Home Health: 100%      Co-Pay: visits per medical neccesity DME: 80%     Co-Pay: 205 Providers: in network  SECONDARY: Medicaid [place] Access      Policy#: [id number] L 8/92 [place]   Financial Counselor:       Phone#:    The "Data Collection Information Summary" for patients in Inpatient Rehabilitation Facilities with attached "Privacy Act [place] Records" was provided and verbally reviewed with: Patient   Emergency Contact Information         Contact Information     Name Relation Home Work Mobile    Erica [name] [phone]        Erica [name] [phone]           Current Medical History  Patient Admitting Diagnosis: CVA   History of Present Illness:  HPI: Erica [name] is a 65 y.o. 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    Presented 11/03/2020 with persistent headache and left-sided weakness x3 days. MRI shows punctuate acute infarction at the medial right parieto-occipital junction.  Chronic small vessel ischemic changes.  MRA of the head negative for large vessel occlusion.  MRI cervical spine with persistent patchy signal abnormality involving the central/dorsal aspect of the cervical spinal cord extending  from C2-C7-T1 relatively stable and unchanged from previous tracings.  Admission chemistries unremarkable except chloride 113 glucose 100, creatinine 1.70, hemoglobin 9.2, hemoglobin A1c 6.3.  Echocardiogram with ejection fraction of 60 to 53% grade 1 diastolic dysfunction.  Currently maintained on aspirin 81 mg daily and Plavix 75 mg daily for CVA prophylaxis x3 weeks then aspirin alone.  Subcutaneous heparin for DVT prophylaxis. Plan for loop recorder to follow up in outpatient setting per patient request.    Complete NIHSS TOTAL: 0 Glasgow Coma Scale Score: 15   Past Medical History      Past Medical History:  Diagnosis Date  . Arthritis      especially in shoulders  . Asthma    . Depression    . Diabetes mellitus    . GERD (gastroesophageal reflux disease)    . Headache(784.0)      "mild"  . Hypertension    . Mental disorder      depression  . Neuropathy      feet   . Pain      arthritis pain - takes tramadol as needed  . Rectal polyp      very little bleeding with bowel movements- no pain  . Suicide attempt [facility])      Family History  family history includes Cancer in her maternal aunt, maternal uncle, maternal uncle, maternal uncle, paternal aunt, and paternal uncle; Cancer (age of onset: 51) in an other family member; Diabetes in her father; Heart disease in her mother; Hyperlipidemia in her  mother; Hypertension in her brother, brother, father, mother, [name], and [name]; Stroke in her father.   Prior Rehab/Hospitalizations:  Has the patient had prior rehab or hospitalizations prior to admission? Yes CIR 06/2020   Has the patient had major surgery during 100 days prior to admission? yes   Current Medications    Current Facility-Administered Medications:  .   stroke: mapping our early stages of recovery book, , Does not apply, Once, Opyd, Ilene Qua, MD .  acetaminophen (TYLENOL) tablet 650 mg, 650 mg, Oral, Q4H PRN, 650 mg at 11/05/20 1224 **OR** acetaminophen (TYLENOL) 160 MG/5ML solution 650 mg, 650 mg, Per Tube, Q4H PRN **OR** acetaminophen (TYLENOL) suppository 650 mg, 650 mg, Rectal, Q4H PRN, Opyd, Timothy S, MD .  albuterol (VENTOLIN HFA) 108 (90 Base) MCG/ACT inhaler 2 puff, 2 puff, Inhalation, Q4H PRN, Opyd, Timothy S, MD .  amLODipine (NORVASC) tablet 5 mg, 5 mg, Oral, Daily, Cyndia Skeeters, Taye T, MD, 5 mg at 11/06/20 0933 .  aspirin EC tablet 81 mg, 81 mg, Oral, Daily, Opyd, Ilene Qua, MD, 81 mg at 11/06/20 0933 .  busPIRone (BUSPAR) tablet 10 mg, 10 mg, Oral, BID, Opyd, Ilene Qua, MD, 10 mg at 11/06/20 0933 .  clopidogrel (PLAVIX) tablet 75 mg, 75 mg, Oral, Daily, Opyd, Ilene Qua, MD, 75 mg at 11/06/20 0933 .  diclofenac Sodium (VOLTAREN) 1 % topical gel 2 g, 2 g, Topical, Daily PRN, Opyd, Timothy S, MD .  famotidine (PEPCID) tablet 20 mg, 20 mg, Oral, Daily, Opyd, Ilene Qua, MD, 20 mg at 11/06/20 0934 .  gabapentin (NEURONTIN) capsule 200 mg, 200 mg, Oral, BID PRN, Opyd, Timothy S, MD .  heparin injection 5,000 Units, 5,000 Units, Subcutaneous, Q8H, Opyd, Ilene Qua, MD, 5,000 Units at 11/05/20 2313 .  hydrALAZINE (APRESOLINE) tablet 25 mg, 25 mg, Oral, Q4H PRN, Dwyane Dee, MD, 25 mg at 11/04/20 1723 .  insulin aspart (novoLOG) injection 0-5 Units, 0-5 Units, Subcutaneous, QHS, Opyd, Ilene Qua, MD .  insulin aspart (novoLOG) injection 0-9 Units, 0-9 Units, Subcutaneous, TID  WC, Opyd, Ilene Qua, MD, 1 Units at 11/06/20 1120 .  insulin glargine (LANTUS) injection 25 Units, 25 Units, Subcutaneous, BID, Opyd, Ilene Qua, MD, 25 Units at 11/06/20 0934 .  labetalol (NORMODYNE) injection 10 mg, 10 mg, Intravenous, Q4H PRN, Dwyane Dee, MD .  losartan (COZAAR) tablet 50 mg, 50 mg, Oral, Daily, Dwyane Dee, MD, 50 mg at 11/06/20 0934 .  mometasone-formoterol (DULERA) 200-5 MCG/ACT inhaler 2 puff, 2 puff, Inhalation, BID, Opyd, Ilene Qua, MD, 2 puff at 11/06/20 0820 .  Pirfenidone TABS 801 mg, 3 tablet, Oral, TID, Opyd, Ilene Qua, MD, 801 mg at 11/06/20 0936 .  rosuvastatin (CRESTOR) tablet 20 mg, 20 mg, Oral, Daily, Opyd, Ilene Qua, MD, 20 mg at 11/06/20 0934 .  senna-docusate (Senokot-S) tablet 1 tablet, 1 tablet, Oral, QHS PRN, Opyd, Ilene Qua, MD .  traZODone (DESYREL) tablet 50 mg, 50 mg, Oral, QHS, Opyd, Timothy S, MD, 50 mg at 11/05/20 2315 .  umeclidinium bromide (INCRUSE ELLIPTA) 62.5 MCG/INH 1 puff, 1 puff, Inhalation, Daily, Opyd, Ilene Qua, MD, 1 puff at 11/06/20 0820 .  venlafaxine XR (EFFEXOR-XR) 24 hr capsule 75 mg, 75 mg, Oral, Q breakfast, Opyd, Ilene Qua, MD, 75 mg at 11/06/20 6387   Patients Current Diet:     Diet Order                      Diet Heart Room service appropriate? Yes; Fluid consistency: Thin  Diet effective now                    Precautions / Restrictions Precautions Precautions: Fall Precaution Comments: mod fall Restrictions Weight Bearing Restrictions: No    Has the patient had 2 or more falls or a fall with injury in the past year?No   Prior Activity Level Community (5-7x/wk): Mod I with RW   Prior Functional Level Prior Function Level of Independence: Independent with assistive device(s) Comments: Ind wirh ADLs/selfcare, Uses RW for ambulation   Self Care: Did the patient need help bathing, dressing, using the toilet or eating?  Independent   Indoor Mobility: Did the patient need assistance with walking from  room to room (with or without device)? Independent   Stairs: Did the patient need assistance with internal or external stairs (with or without device)? Independent   Functional Cognition: Did the patient need help planning regular tasks such as shopping or remembering to take medications? Needed some help   Home Assistive Devices / Equipment Home Assistive Devices/Equipment: Walker (specify type),Cane (specify quad or straight),Shower chair with back Home Equipment: Shower seat,Walker - 2 wheels,Cane - single point,Bedside commode   Prior Device Use: Indicate devices/aids used by the patient prior to current illness, exacerbation or injury? Walker   Current Functional Level Cognition   Arousal/Alertness: Awake/alert Overall Cognitive Status: Impaired/Different from baseline Orientation Level: Oriented X4 Safety/Judgement: Decreased awareness of safety Attention: Selective Selective Attention: Impaired Selective Attention Impairment: Verbal basic Memory: Impaired Memory Impairment: Retrieval deficit Awareness: Impaired Awareness Impairment: Emergent impairment Problem Solving: Impaired Problem Solving Impairment: Verbal basic Executive Function: Initiating Initiating: Impaired    Extremity Assessment (includes Sensation/Coordination)   Upper Extremity Assessment: Overall WFL for tasks assessed,LUE deficits/detail LUE Deficits / Details: reports numbness and tingling in L hand LUE Sensation: decreased light touch  Lower Extremity Assessment: Defer to PT evaluation LLE Deficits / Details: Functional weakness noted. Grossly 3/5. Noted buckling with mobility tasks.  ADLs   Overall ADL's : Needs assistance/impaired Eating/Feeding: Independent Grooming: Wash/dry hands,Wash/dry face,Min guard,Sitting Upper Body Bathing: Set up,Supervision/ safety,Sitting Lower Body Bathing: Minimal assistance,Min guard,Sit to/from stand Upper Body Dressing : Set  up,Supervision/safety,Sitting Lower Body Dressing: Minimal assistance,Sit to/from stand Toilet Transfer: Minimal assistance,Ambulation,RW,Regular Toilet,Grab bars Toileting- Clothing Manipulation and Hygiene: Min guard,Sit to/from stand Functional mobility during ADLs: Minimal assistance,Min guard,Rolling walker General ADL Comments: min A for initial stand from EOB with RW     Mobility   Overal bed mobility: Needs Assistance Bed Mobility: Supine to Sit,Sit to Supine Supine to sit: Min guard Sit to supine: Min guard General bed mobility comments: Not assessed, patient received sitting on sofa in room with [name] present and returned to sofa     Transfers   Overall transfer level: Needs assistance Equipment used: Rolling walker (2 wheeled) Transfers: Sit to/from Stand Sit to Stand: Min guard General transfer comment: Min A for steadying assist to stand.     Ambulation / Gait / Stairs / Wheelchair Mobility   Ambulation/Gait Ambulation/Gait assistance: Counsellor (Feet): 125 Feet Assistive device: Rolling walker (2 wheeled) General Gait Details: improved ability with ambulation this visit. No buckling of L knee noted this visit. Her left LE is weaker with ambulation and she fatigued significantly with this distance. Gait velocity: decr     Posture / Balance Balance Overall balance assessment: Needs assistance Sitting-balance support: Feet supported Sitting balance-Leahy Scale: Good Standing balance support: Bilateral upper extremity supported,During functional activity Standing balance-Leahy Scale: Fair Standing balance comment: Reliant on UE and min guard     Special needs/care consideration LOOP placement deferred to outpatient follow up per patient request        Previous Home Environment  Living Arrangements:  (son lives with her)  Lives With: Son Available Help at Discharge: Available 24 hours/day,Family Type of Home: Apartment Home Layout: One level Home  Access: Level entry Bathroom Shower/Tub: Tub/shower unit,Curtain Biochemist, clinical: Handicapped height Bathroom Accessibility: Yes Home Care Services: Yes Type of Home Care Services: [place] (if known): [facility] Additional Comments: Owns a shower chair and uses a shower hose   Discharge Living Setting Plans for Discharge Living Setting: Patient's home,Apartment,Lives with (comment) (son lives with patient) Type of Home at Discharge: Apartment Discharge Home Layout: One level Discharge Home Access: Level entry Discharge Bathroom Shower/Tub: Tub/shower unit Discharge Bathroom Toilet: Handicapped height Discharge Bathroom Accessibility: Yes How Accessible: Accessible via walker Does the patient have any problems obtaining your medications?: No   Social/Family/Support Systems Patient Roles: Parent Contact Information: [name], [name] Anticipated Caregiver: [name] and son prn Anticipated Caregiver's Contact Information: [name] [phone] Ability/Limitations of Caregiver: intermittent assist Caregiver Availability: Intermittent Discharge Plan Discussed with Primary Caregiver: Yes Is Caregiver In Agreement with Plan?: Yes Does Caregiver/Family have Issues with Lodging/Transportation while Pt is in Rehab?: No   Goals Patient/Family Goal for Rehab: Mod I to supervision with PT and OT Expected length of stay: ELOS 7 to 11 days Pt/Family Agrees to Admission and willing to participate: Yes Program Orientation Provided & Reviewed with Pt/Caregiver Including Roles  & Responsibilities: Yes   Decrease burden of Care through IP rehab admission: n/a   Possible need for SNF placement upon discharge:not anticipated   Patient Condition: This patient's condition remains as documented in the consult dated 11/05/2020, in which the Rehabilitation Physician determined and documented that the patient's condition is appropriate for intensive rehabilitative care in an inpatient  rehabilitation facility. Will admit  to inpatient rehab today.   Preadmission Screen Completed By:  Cleatrice Burke, RN, 11/06/2020 2:11 PM ______________________________________________________________________   Discussed status with Dr. Naaman Plummer on 11/06/2020 at  1432 and received approval for admission today.   Admission Coordinator:  Cleatrice Burke, time 5397 Date 11/06/2020             Cosigned by: Meredith Staggers, MD at 11/06/2020  2:36 PM    Revision History                                            Note Details  Author Cristina Gong, RN File Time 11/06/2020  2:32 PM  Author Type Rehab Admission Coordinator Status Signed  Last Editor Cristina Gong, RN Service Physical Medicine and Rehabilitation

## 2020-11-06 NOTE — Progress Notes (Signed)
Patient admitted to rehab in hospital bed this afternoon. No complaints of pain at this time, call light, phone, and personal belongings within reach. Angie Fava

## 2020-11-06 NOTE — H&P (Signed)
Physical Medicine and Rehabilitation Admission H&P    Chief Complaint  Patient presents with  . 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  Per chart review lives with her children.  Independent with assistive device.  1 level home with level entry.  Good supportive family.  Presented 11/03/2020 with persistent headache and left-sided weakness x3 days.  MRI showed punctate acute infarction at the medial right parieto-occipital junction.  Chronic small vessel ischemic changes.  MRA of the head negative for large vessel occlusion.  MRI cervical spine with persistent patchy signal abnormality involving the central dorsal aspect of the cervical spinal cord extending from C2-C7-T1 relatively stable and unchanged from previous tracings.  Admission chemistries unremarkable except chloride 113 creatinine 1.70 hemoglobin 9.2 hemoglobin A1c 6.3.  Echocardiogram with ejection fraction of 60 to 98% grade 1 diastolic dysfunction.  Currently maintained on aspirin and Plavix for CVA prophylaxis x3 weeks and aspirin alone.  Subcutaneous heparin for DVT prophylaxis.  Awaiting plan for loop recorder.  Therapy evaluations completed due to patient's left-sided weakness was admitted for a comprehensive rehab program.  Review of Systems  Constitutional: Negative for chills and fever.  HENT: Negative for hearing loss.   Eyes: Negative for blurred vision and double vision.  Respiratory: Negative for cough and shortness of breath.   Cardiovascular: Negative for chest pain and palpitations.  Gastrointestinal: Positive for constipation. Negative for heartburn, nausea and vomiting.       GERD  Genitourinary: Negative for dysuria, flank pain and hematuria.  Musculoskeletal: Positive  for myalgias.  Neurological: Positive for headaches.  Psychiatric/Behavioral: Positive for depression. The patient has insomnia.        Anxiety  All other systems reviewed and are negative.  Past Medical History:  Diagnosis Date  . Arthritis    especially in shoulders  . Asthma   . Depression   . Diabetes mellitus   . GERD (gastroesophageal reflux disease)   . Headache(784.0)    "mild"  . Hypertension   . Mental disorder    depression  . Neuropathy    feet   . Pain    arthritis pain - takes tramadol as needed  . Rectal polyp    very little bleeding with bowel movements- no pain  . Suicide attempt [facility])    Past Surgical History:  Procedure Laterality Date  . ANAL RECTAL MANOMETRY N/A 07/13/2016   Procedure: ANO RECTAL MANOMETRY;  Surgeon: Leighton Ruff, MD;  Location: [facility];  Service: Endoscopy;  Laterality: N/A;  . CESAREAN SECTION    . EUS N/A 11/21/2012   Procedure: LOWER ENDOSCOPIC ULTRASOUND (EUS);  Surgeon: Arta Silence, MD;  Location: [facility];  Service: Endoscopy;  Laterality: N/A;  . FLEXIBLE SIGMOIDOSCOPY N/A 11/21/2012   Procedure: FLEXIBLE SIGMOIDOSCOPY;  Surgeon: Arta Silence, MD;  Location: [facility];  Service: Endoscopy;  Laterality: N/A;  . FLEXIBLE SIGMOIDOSCOPY N/A 01/28/2013   Procedure: FLEXIBLE SIGMOIDOSCOPY;  Surgeon: Leighton Ruff, MD;  Location: [facility];  Service: Endoscopy;  Laterality: N/A;  . LAPAROSCOPIC LOW ANTERIOR RESECTION N/A 01/29/2013   Procedure: LAPAROSCOPIC LOW ANTERIOR RESECTION, Rigid Proctoscopy;  Surgeon: Leighton Ruff, MD;  Location: [facility];  Service: General;  Laterality: N/A;  . LAPAROSCOPIC SIGMOID COLECTOMY N/A 11/14/2012   Procedure: DIAGNOSTIC LAPAROSCOPY AND SIGMOIDMOIDOSCOPY ;  Surgeon: Rodman Key  Donne Hazel, MD;  Location: [facility];  Service: General;  Laterality: N/A;  . RECTAL ULTRASOUND N/A 07/13/2016   Procedure: RECTAL ULTRASOUND;  Surgeon: Leighton Ruff, MD;  Location: [facility];  Service: Endoscopy;   Laterality: N/A;  . TONSILLECTOMY    . TONSILLECTOMY AND ADENOIDECTOMY     Family History  Problem Relation Age of Onset  . Hypertension Father   . Stroke Father   . Diabetes Father   . Heart disease Mother   . Hypertension Mother   . Hyperlipidemia Mother   . Hypertension Sister   . Hypertension Brother   . Hypertension Sister   . Hypertension Brother   . Cancer Other 47       colon cancer   . Cancer Maternal Aunt        cancer, unknown type   . Cancer Maternal Uncle        bone cancer   . Cancer Paternal Aunt        lung cancer  . Cancer Paternal Uncle        lung cancer  . Cancer Maternal Uncle        colon cancer and prostate cancer   . Cancer Maternal Uncle        prostate cancer    Social History:  reports that she quit smoking about 2 years ago. Her smoking use included cigarettes. She has a 10.00 pack-year smoking history. She has never used smokeless tobacco. She reports previous alcohol use. She reports previous drug use. Drug: "Crack" cocaine. Allergies:  Allergies  Allergen Reactions  . Penicillins Hives, Itching and Swelling    Tongue swells up Has patient had a PCN reaction causing immediate rash, facial/tongue/throat swelling, SOB or lightheadedness with hypotension: Yes Has patient had a PCN reaction causing severe rash involving mucus membranes or skin necrosis: Yes Has patient had a PCN reaction that required hospitalization Yes Has patient had a PCN reaction occurring within the last 10 years: No If all of the above answers are "NO", then may proceed with Cephalosporin use.    Medications Prior to Admission  Medication Sig Dispense Refill  . acetaminophen (TYLENOL) 325 MG tablet Take 2 tablets (650 mg total) by mouth every 4 (four) hours as needed for mild pain (or temp > 37.5 C (99.5 F)).    Marland Kitchen albuterol (VENTOLIN HFA) 108 (90 Base) MCG/ACT inhaler Inhale 2 puffs into the lungs every 4 (four) hours as needed for wheezing. 1 g 5  . amLODipine  (NORVASC) 10 MG tablet Take 1 tablet (10 mg total) by mouth daily. 30 tablet 2  . BD PEN NEEDLE NANO 2ND GEN 32G X 4 MM MISC See admin instructions.    . blood glucose meter kit and supplies Dispense based on patient and insurance preference. Use up to four times daily as directed. (FOR ICD-10 E10.9, E11.9). 1 each 0  . blood glucose meter kit and supplies Dispense based on patient and insurance preference. Use up to four times daily as directed. (FOR ICD-10 E10.9, E11.9). 1 each 0  . busPIRone (BUSPAR) 10 MG tablet Take 1 tablet (10 mg total) by mouth daily at 6 (six) AM. (Patient taking differently: Take 20 mg by mouth.) 30 tablet 0  . diclofenac Sodium (VOLTAREN) 1 % GEL Apply 2 g topically 4 (four) times daily. (Patient taking differently: Apply 2 g topically daily as needed (Knee pain).) 2 g 0  . famotidine (PEPCID) 20 MG tablet Take 1 tablet (20 mg total) by mouth daily.  30 tablet 0  . fenofibrate (TRICOR) 145 MG tablet TAKE 1 TABLET(145 MG) BY MOUTH DAILY (Patient taking differently: Take 145 mg by mouth daily.) 90 tablet 1  . gabapentin (NEURONTIN) 300 MG capsule Take 1 capsule (300 mg total) by mouth 3 (three) times daily. (Patient taking differently: Take 300 mg by mouth 3 (three) times daily as needed (pain).) 90 capsule 0  . hydrALAZINE (APRESOLINE) 25 MG tablet Take 1.5 tablets (37.5 mg total) by mouth every 8 (eight) hours. 90 tablet 0  . icosapent Ethyl (VASCEPA) 1 g capsule Take 2 capsules (2 g total) by mouth 2 (two) times daily. 360 capsule 0  . losartan-hydrochlorothiazide (HYZAAR) 50-12.5 MG tablet Take 1 tablet by mouth daily.    . metoprolol succinate (TOPROL-XL) 50 MG 24 hr tablet TAKE 1 TABLET(50 MG) BY MOUTH DAILY WITH OR IMMEDIATELY FOLLOWING A MEAL (Patient taking differently: Take 50 mg by mouth daily as needed (For blood pressure).) 90 tablet 3  . Multiple Vitamin (MULTIVITAMIN ADULT PO) Take 1 tablet by mouth daily.    . Pirfenidone (ESBRIET) 267 MG TABS Take 3 tablets  (801 mg total) by mouth in the morning, at noon, and at bedtime. 270 tablet 11  . rosuvastatin (CRESTOR) 20 MG tablet Take 20 mg by mouth daily.    . SYMBICORT 160-4.5 MCG/ACT inhaler Inhale 2 puffs into the lungs 2 (two) times daily. 1 each 5  . Tiotropium Bromide Monohydrate (SPIRIVA RESPIMAT) 1.25 MCG/ACT AERS Inhale 2 puffs into the lungs daily. 4 g 5  . TOUJEO SOLOSTAR 300 UNIT/ML Solostar Pen Inject 12 Units into the skin 2 (two) times daily. 50 units  in th A.M. and  40 units subcutaneous in the P.M. 1.5 mL 1  . traZODone (DESYREL) 50 MG tablet Take 50 mg by mouth at bedtime.    Marland Kitchen venlafaxine XR (EFFEXOR-XR) 75 MG 24 hr capsule Take 75 mg by mouth daily with breakfast.      Drug Regimen Review Drug regimen was reviewed and remains appropriate with no significant issues identified  Home: Home Living Family/patient expects to be discharged to:: Private residence Living Arrangements:  (son lives with her) Available Help at Discharge: Available 24 hours/day,Family Type of Home: Apartment Home Access: Level entry Home Layout: One level Bathroom Shower/Tub: Tub/shower unit,Curtain Biochemist, clinical: Handicapped height Bathroom Accessibility: Yes Home Equipment: Media planner - 2 wheels,Cane - single point,Bedside commode Additional Comments: Owns a shower chair and uses a shower hose  Lives With: Son   Functional History: Prior Function Level of Independence: Independent with assistive device(s) Comments: Ind wirh ADLs/selfcare, Uses RW for ambulation  Functional Status:  Mobility: Bed Mobility Overal bed mobility: Needs Assistance Bed Mobility: Supine to Sit,Sit to Supine Supine to sit: Min guard Sit to supine: Min guard General bed mobility comments: Min A for trunk elevation to come to sitting. Required min A for LE assist for return to supine. Transfers Overall transfer level: Needs assistance Equipment used: 1 person hand held assist,Rolling walker (2  wheeled) Transfers: Sit to/from Stand Sit to Stand: Min assist General transfer comment: Min A for steadying assist to stand. Ambulation/Gait Ambulation/Gait assistance: Mod assist Assistive device: 1 person hand held assist General Gait Details: Pt with increased L knee buckling during stance phase. Required manual blocking of L knee to take steps at EOB. Mod A for steadying.    ADL: ADL Overall ADL's : Needs assistance/impaired Eating/Feeding: Independent Grooming: Wash/dry hands,Wash/dry face,Min guard,Sitting Upper Body Bathing: Set up,Supervision/ safety,Sitting Lower Body Bathing:  Minimal assistance,Min guard,Sit to/from stand Upper Body Dressing : Set up,Supervision/safety,Sitting Lower Body Dressing: Minimal assistance,Sit to/from stand Toilet Transfer: Minimal assistance,Ambulation,RW,Regular Toilet,Grab bars Toileting- Clothing Manipulation and Hygiene: Min guard,Sit to/from stand Functional mobility during ADLs: Minimal assistance,Min guard,Rolling walker General ADL Comments: min A for initial stand from EOB with RW  Cognition: Cognition Overall Cognitive Status: Impaired/Different from baseline Arousal/Alertness: Awake/alert Orientation Level: Oriented X4 Attention: Selective Selective Attention: Impaired Selective Attention Impairment: Verbal basic Memory: Impaired Memory Impairment: Retrieval deficit Awareness: Impaired Awareness Impairment: Emergent impairment Problem Solving: Impaired Problem Solving Impairment: Verbal basic Executive Function: Initiating Initiating: Impaired Cognition Arousal/Alertness: Awake/alert Behavior During Therapy: WFL for tasks assessed/performed Overall Cognitive Status: Impaired/Different from baseline  Physical Exam: Blood pressure (!) 165/81, pulse 84, temperature 98.4 F (36.9 C), temperature source Oral, resp. rate 18, height _0  (1.6 m), weight 83.3 kg, SpO2 97 %. Physical Exam Constitutional:      General: She is  not in acute distress.    Appearance: Normal appearance.  HENT:     Head: Normocephalic and atraumatic.     Right Ear: External ear normal.     Left Ear: External ear normal.     Nose: Nose normal.     Mouth/Throat:     Mouth: Mucous membranes are moist.  Eyes:     Pupils: Pupils are equal, round, and reactive to light.  Cardiovascular:     Rate and Rhythm: Normal rate and regular rhythm.     Pulses: Normal pulses.     Heart sounds: Normal heart sounds.  Pulmonary:     Effort: Pulmonary effort is normal.     Breath sounds: Normal breath sounds.  Abdominal:     General: Bowel sounds are normal. There is no distension.     Tenderness: There is no abdominal tenderness.  Skin:    General: Skin is warm.  Neurological:     Mental Status: She is alert.     Comments: Patient is alert in no acute distress.  Makes eye contact with examiner.  Oriented to person and place.  Follows commands.  RUE and RLE 4/5. LUE 3+ to 4/5 with decreased Zuehl, LLE 4-/5. Sensation 1/2 LUE, 1+/2 LLE. Fair sitting balance. Reasonable insight and awarenes.    Psychiatric:        Mood and Affect: Mood normal.        Behavior: Behavior normal.     Results for orders placed or performed during the hospital encounter of 11/03/20 (from the past 48 hour(s))  CBG monitoring, ED     Status: None   Collection Time: 11/04/20 12:47 PM  Result Value Ref Range   Glucose-Capillary 87 70 - 99 mg/dL    Comment: Glucose reference range applies only to samples taken after fasting for at least 8 hours.  CBG monitoring, ED     Status: None   Collection Time: 11/04/20  5:09 PM  Result Value Ref Range   Glucose-Capillary 82 70 - 99 mg/dL    Comment: Glucose reference range applies only to samples taken after fasting for at least 8 hours.  Glucose, capillary     Status: Abnormal   Collection Time: 11/04/20  9:40 PM  Result Value Ref Range   Glucose-Capillary 193 (H) 70 - 99 mg/dL    Comment: Glucose reference range applies  only to samples taken after fasting for at least 8 hours.  Basic metabolic panel     Status: Abnormal   Collection Time: 11/05/20  3:04 AM  Result Value Ref Range   Sodium 140 135 - 145 mmol/L   Potassium 3.1 (L) 3.5 - 5.1 mmol/L   Chloride 113 (H) 98 - 111 mmol/L   CO2 20 (L) 22 - 32 mmol/L   Glucose, Bld 98 70 - 99 mg/dL    Comment: Glucose reference range applies only to samples taken after fasting for at least 8 hours.   BUN 17 8 - 23 mg/dL   Creatinine, Ser 1.63 (H) 0.44 - 1.00 mg/dL   Calcium 8.7 (L) 8.9 - 10.3 mg/dL   GFR, Estimated 35 (L) >60 mL/min    Comment: (NOTE) Calculated using the CKD-EPI Creatinine Equation (2021)    Anion gap 7 5 - 15    Comment: Performed at [facility] [address] [place], [place] [address]  CBC with Differential/Platelet     Status: Abnormal   Collection Time: 11/05/20  3:04 AM  Result Value Ref Range   WBC 3.5 (L) 4.0 - 10.5 K/uL   RBC 2.83 (L) 3.87 - 5.11 MIL/uL   Hemoglobin 9.2 (L) 12.0 - 15.0 g/dL   HCT 25.7 (L) 36.0 - 46.0 %   MCV 90.8 80.0 - 100.0 fL   MCH 32.5 26.0 - 34.0 pg   MCHC 35.8 30.0 - 36.0 g/dL   RDW 15.9 (H) 11.5 - 15.5 %   Platelets 140 (L) 150 - 400 K/uL    Comment: REPEATED TO VERIFY   nRBC 0.0 0.0 - 0.2 %   Neutrophils Relative % 58 %   Neutro Abs 2.1 1.7 - 7.7 K/uL   Lymphocytes Relative 31 %   Lymphs Abs 1.1 0.7 - 4.0 K/uL   Monocytes Relative 8 %   Monocytes Absolute 0.3 0.1 - 1.0 K/uL   Eosinophils Relative 2 %   Eosinophils Absolute 0.1 0.0 - 0.5 K/uL   Basophils Relative 0 %   Basophils Absolute 0.0 0.0 - 0.1 K/uL   Immature Granulocytes 1 %   Abs Immature Granulocytes 0.02 0.00 - 0.07 K/uL    Comment: Performed at [facility], [place] [address], [place], [place] [address]  Magnesium     Status: None   Collection Time: 11/05/20  3:04 AM  Result Value Ref Range   Magnesium 1.7 1.7 - 2.4 mg/dL    Comment: Performed at [facility] [address], [place], [place] [address]   Glucose, capillary     Status: None   Collection Time: 11/05/20  6:00 AM  Result Value Ref Range   Glucose-Capillary 88 70 - 99 mg/dL    Comment: Glucose reference range applies only to samples taken after fasting for at least 8 hours.  Vitamin B12     Status: None   Collection Time: 11/05/20  9:53 AM  Result Value Ref Range   Vitamin B-12 737 180 - 914 pg/mL    Comment: (NOTE) This assay is not validated for testing neonatal or myeloproliferative syndrome specimens for Vitamin B12 levels. Performed at [facility], [place] [address], [place], [place] [address]   Folate     Status: None   Collection Time: 11/05/20  9:53 AM  Result Value Ref Range   Folate 38.9 >5.9 ng/mL    Comment: Performed at [facility] [address], [place], [place] [address]  Glucose, capillary     Status: Abnormal   Collection Time: 11/05/20 11:37 AM  Result Value Ref Range   Glucose-Capillary 183 (H) 70 - 99  mg/dL    Comment: Glucose reference range applies only to samples taken after fasting for at least 8 hours.  Glucose, capillary     Status: Abnormal   Collection Time: 11/05/20  4:15 PM  Result Value Ref Range   Glucose-Capillary 194 (H) 70 - 99 mg/dL    Comment: Glucose reference range applies only to samples taken after fasting for at least 8 hours.   Comment 1 Notify RN   Glucose, capillary     Status: Abnormal   Collection Time: 11/05/20  9:45 PM  Result Value Ref Range   Glucose-Capillary 127 (H) 70 - 99 mg/dL    Comment: Glucose reference range applies only to samples taken after fasting for at least 8 hours.  Basic metabolic panel     Status: Abnormal   Collection Time: 11/06/20  2:30 AM  Result Value Ref Range   Sodium 140 135 - 145 mmol/L   Potassium 3.9 3.5 - 5.1 mmol/L   Chloride 114 (H) 98 - 111 mmol/L   CO2 21 (L) 22 - 32 mmol/L   Glucose, Bld 124 (H) 70 - 99 mg/dL    Comment: Glucose reference range applies only to samples taken after fasting for at least 8 hours.    BUN 16 8 - 23 mg/dL   Creatinine, Ser 1.71 (H) 0.44 - 1.00 mg/dL   Calcium 8.9 8.9 - 10.3 mg/dL   GFR, Estimated 33 (L) >60 mL/min    Comment: (NOTE) Calculated using the CKD-EPI Creatinine Equation (2021)    Anion gap 5 5 - 15    Comment: Performed at [facility] [address] [place], [place] [address]  CBC with Differential/Platelet     Status: Abnormal   Collection Time: 11/06/20  2:30 AM  Result Value Ref Range   WBC 3.3 (L) 4.0 - 10.5 K/uL   RBC 2.99 (L) 3.87 - 5.11 MIL/uL   Hemoglobin 9.7 (L) 12.0 - 15.0 g/dL   HCT 27.0 (L) 36.0 - 46.0 %   MCV 90.3 80.0 - 100.0 fL   MCH 32.4 26.0 - 34.0 pg   MCHC 35.9 30.0 - 36.0 g/dL   RDW 16.1 (H) 11.5 - 15.5 %   Platelets 139 (L) 150 - 400 K/uL    Comment: REPEATED TO VERIFY   nRBC 0.0 0.0 - 0.2 %   Neutrophils Relative % 62 %   Neutro Abs 2.0 1.7 - 7.7 K/uL   Lymphocytes Relative 29 %   Lymphs Abs 0.9 0.7 - 4.0 K/uL   Monocytes Relative 6 %   Monocytes Absolute 0.2 0.1 - 1.0 K/uL   Eosinophils Relative 2 %   Eosinophils Absolute 0.1 0.0 - 0.5 K/uL   Basophils Relative 0 %   Basophils Absolute 0.0 0.0 - 0.1 K/uL   Immature Granulocytes 1 %   Abs Immature Granulocytes 0.02 0.00 - 0.07 K/uL    Comment: Performed at [facility], [place] [address], [place], [place] [address]  Magnesium     Status: None   Collection Time: 11/06/20  2:30 AM  Result Value Ref Range   Magnesium 1.9 1.7 - 2.4 mg/dL    Comment: Performed at [facility] [address], [place], [place] [address]  Glucose, capillary     Status: Abnormal   Collection Time: 11/06/20  6:12 AM  Result Value Ref Range   Glucose-Capillary 108 (H) 70 - 99 mg/dL    Comment: Glucose reference range applies only to samples taken  after fasting for at least 8 hours.  Glucose, capillary     Status: Abnormal   Collection Time: 11/06/20 11:08 AM  Result Value Ref Range   Glucose-Capillary 129 (H) 70 - 99 mg/dL    Comment: Glucose reference range applies only  to samples taken after fasting for at least 8 hours.   ECHOCARDIOGRAM COMPLETE  Result Date: 11/04/2020    ECHOCARDIOGRAM REPORT   Patient Name:   Erica [name] Date of Exam: 11/04/2020 Medical Rec #:  [id number]    Height:       63.0 in Accession #:    [id number]   Weight:       183.6 lb Date of Birth:  [birth year]    BSA:          1.865 m Patient Age:    33 years     BP:           157/76 mmHg Patient Gender: F            HR:           50 bpm. Exam Location:  Inpatient Procedure: 2D Echo, Cardiac Doppler and Color Doppler Indications:    CVA  History:        Patient has prior history of Echocardiogram examinations, most                 recent 06/26/2020. Risk Factors:Hypertension and Diabetes.  Sonographer:    Luisa Hart RDCS Referring Phys: [id number] [place]  1. Left ventricular ejection fraction, by estimation, is 60 to 65%. The left ventricle has normal function. The left ventricle has no regional wall motion abnormalities. There is moderate concentric left ventricular hypertrophy. Left ventricular diastolic parameters are consistent with Grade I diastolic dysfunction (impaired relaxation). Elevated left atrial pressure.  2. Right ventricular systolic function is normal. The right ventricular size is normal.  3. The mitral valve is normal in structure. No evidence of mitral valve regurgitation. No evidence of mitral stenosis.  4. The aortic valve is tricuspid. There is mild calcification of the aortic valve. There is mild thickening of the aortic valve. Aortic valve regurgitation is mild. Mild aortic valve sclerosis is present, with no evidence of aortic valve stenosis.  5. The inferior vena cava is normal in size with greater than 50% respiratory variability, suggesting right atrial pressure of 3 mmHg. Comparison(s): No significant change from prior study. Prior images reviewed side by side. FINDINGS  Left Ventricle: Left ventricular ejection fraction, by estimation, is 60 to 65%. The left  ventricle has normal function. The left ventricle has no regional wall motion abnormalities. The left ventricular internal cavity size was normal in size. There is  moderate concentric left ventricular hypertrophy. Left ventricular diastolic parameters are consistent with Grade I diastolic dysfunction (impaired relaxation). Elevated left atrial pressure. Right Ventricle: The right ventricular size is normal. No increase in right ventricular wall thickness. Right ventricular systolic function is normal. Left Atrium: Left atrial size was normal in size. Right Atrium: Right atrial size was normal in size. Pericardium: There is no evidence of pericardial effusion. Mitral Valve: The mitral valve is normal in structure. There is mild thickening of the mitral valve leaflet(s). No evidence of mitral valve regurgitation. No evidence of mitral valve stenosis. MV peak gradient, 5.4 mmHg. The mean mitral valve gradient is  1.0 mmHg. Tricuspid Valve: The tricuspid valve is normal in structure. Tricuspid valve regurgitation is not demonstrated. No evidence of tricuspid stenosis. Aortic Valve: The  aortic valve is tricuspid. There is mild calcification of the aortic valve. There is mild thickening of the aortic valve. Aortic valve regurgitation is mild. Aortic regurgitation PHT measures 754 msec. Mild aortic valve sclerosis is present, with no evidence of aortic valve stenosis. Aortic valve mean gradient measures 4.0 mmHg. Aortic valve peak gradient measures 8.1 mmHg. Aortic valve area, by VTI measures 1.57 cm. Pulmonic Valve: The pulmonic valve was normal in structure. Pulmonic valve regurgitation is not visualized. No evidence of pulmonic stenosis. Aorta: The aortic root is normal in size and structure. Venous: The inferior vena cava is normal in size with greater than 50% respiratory variability, suggesting right atrial pressure of 3 mmHg. IAS/Shunts: No atrial level shunt detected by color flow Doppler.  LEFT VENTRICLE PLAX 2D  LVIDd:         4.10 cm     Diastology LVIDs:         2.50 cm     LV e' medial:    3.73 cm/s LV PW:         1.50 cm     LV E/e' medial:  18.3 LV IVS:        1.50 cm     LV e' lateral:   4.54 cm/s LVOT diam:     1.80 cm     LV E/e' lateral: 15.0 LV SV:         56 LV SV Index:   30 LVOT Area:     2.54 cm  LV Volumes (MOD) LV vol d, MOD A2C: 33.1 ml LV vol d, MOD A4C: 54.0 ml LV vol s, MOD A2C: 17.1 ml LV vol s, MOD A4C: 14.4 ml LV SV MOD A2C:     16.0 ml LV SV MOD A4C:     54.0 ml LV SV MOD BP:      27.8 ml RIGHT VENTRICLE RV Basal diam:  3.40 cm     PULMONARY VEINS RV Mid diam:    2.20 cm     A Reversal Duration: 113.00 msec RV S prime:     16.50 cm/s  A Reversal Velocity: 27.70 cm/s TAPSE (M-mode): 2.4 cm      Diastolic Velocity:  96.04 cm/s                             S/D Velocity:        1.30                             Systolic Velocity:   54.09 cm/s LEFT ATRIUM             Index LA diam:        2.90 cm 1.56 cm/m LA Vol (A2C):   53.3 ml 28.59 ml/m LA Vol (A4C):   51.4 ml 27.57 ml/m LA Biplane Vol: 55.2 ml 29.60 ml/m  AORTIC VALVE                   PULMONIC VALVE AV Area (Vmax):    1.58 cm    PV Vmax:       0.72 m/s AV Area (Vmean):   1.64 cm    PV Vmean:      57.200 cm/s AV Area (VTI):     1.57 cm    PV VTI:        0.209 m AV Vmax:  142.00 cm/s PV Peak grad:  2.0 mmHg AV Vmean:          88.100 cm/s PV Mean grad:  1.0 mmHg AV VTI:            0.359 m AV Peak Grad:      8.1 mmHg AV Mean Grad:      4.0 mmHg LVOT Vmax:         88.40 cm/s LVOT Vmean:        56.700 cm/s LVOT VTI:          0.222 m LVOT/AV VTI ratio: 0.62 AI PHT:            754 msec  AORTA Ao Root diam: 3.50 cm Ao Asc diam:  3.40 cm MITRAL VALVE MV Area (PHT): 3.17 cm    SHUNTS MV Area VTI:   1.46 cm    Systemic VTI:  0.22 m MV Peak grad:  5.4 mmHg    Systemic Diam: 1.80 cm MV Mean grad:  1.0 mmHg MV Vmax:       1.16 m/s MV Vmean:      50.8 cm/s MV Decel Time: 239 msec MV E velocity: 68.10 cm/s MV A velocity: 99.80 cm/s MV E/A ratio:   0.68 Mihai Croitoru MD Electronically signed by Sanda Klein MD Signature Date/Time: 11/04/2020/3:40:47 PM    Final    VAS US CAROTID (at [facility] and [facility] only)  Result Date: 11/05/2020 Carotid Arterial Duplex Study Patient Name:  Erica [name]  Date of Exam:   11/05/2020 Medical Rec #: [id number]     Accession #:    [id number] Date of Birth: 1955-07-25     Patient Gender: F Patient Age:   65Y Exam Location:  [facility] Procedure:      VAS US CAROTID Referring Phys: [id number] TIMOTHY S OPYD --------------------------------------------------------------------------------  Indications:       CVA. Risk Factors:      Hypertension, Diabetes, past history of smoking, coronary                    artery disease, prior CVA. Other Factors:     COPD, CKD3. Comparison Study:  Previous exam 06/26/20 - WNL Performing Technologist: Rogelia Rohrer  Examination Guidelines: A complete evaluation includes B-mode imaging, spectral Doppler, color Doppler, and power Doppler as needed of all accessible portions of each vessel. Bilateral testing is considered an integral part of a complete examination. Limited examinations for reoccurring indications may be performed as noted.  Right Carotid Findings: +----------+--------+--------+--------+------------------+------------------+           PSV cm/sEDV cm/sStenosisPlaque DescriptionComments           +----------+--------+--------+--------+------------------+------------------+ CCA Prox  54      12                                intimal thickening +----------+--------+--------+--------+------------------+------------------+ CCA Distal39      14                                intimal thickening +----------+--------+--------+--------+------------------+------------------+ ICA Prox  42      15                                intimal thickening +----------+--------+--------+--------+------------------+------------------+ ICA Distal70      20                                                    +----------+--------+--------+--------+------------------+------------------+  ECA       27      6                                                    +----------+--------+--------+--------+------------------+------------------+ +----------+--------+-------+--------+-------------------+           PSV cm/sEDV cmsDescribeArm Pressure (mmHG) +----------+--------+-------+--------+-------------------+ [id number]                                         +----------+--------+-------+--------+-------------------+ +---------+--------+--+--------+--+---------+ VertebralPSV cm/s40EDV cm/s11Antegrade +---------+--------+--+--------+--+---------+  Left Carotid Findings: +----------+--------+--------+--------+------------------+------------------+           PSV cm/sEDV cm/sStenosisPlaque DescriptionComments           +----------+--------+--------+--------+------------------+------------------+ CCA Prox  58      16                                intimal thickening +----------+--------+--------+--------+------------------+------------------+ CCA Distal50      15                                intimal thickening +----------+--------+--------+--------+------------------+------------------+ ICA Prox  43      16                                intimal thickening +----------+--------+--------+--------+------------------+------------------+ ICA Distal50      19                                                   +----------+--------+--------+--------+------------------+------------------+ ECA       25      6                                                    +----------+--------+--------+--------+------------------+------------------+ +----------+--------+--------+--------+-------------------+           PSV cm/sEDV cm/sDescribeArm Pressure (mmHG) +----------+--------+--------+--------+-------------------+ [id number]                                           +----------+--------+--------+--------+-------------------+ +---------+--------+--+--------+--+---------+ VertebralPSV cm/s36EDV cm/s11Antegrade +---------+--------+--+--------+--+---------+   Summary: Right Carotid: The extracranial vessels were near-normal with only minimal wall                thickening or plaque. Left Carotid: The extracranial vessels were near-normal with only minimal wall               thickening or plaque. Vertebrals: Bilateral vertebral arteries demonstrate antegrade flow. *See table(s) above for measurements and observations.  Electronically signed by Servando Snare MD on 11/05/2020 at 6:01:44 PM.    Final      BP (!) 165/81 (BP Location: Right Arm)   Pulse 84   Temp 98.4 F (36.9 C) (Oral)   Resp 18  Ht _0  (1.6 m)   Wt 83.3 kg   SpO2 97%   BMI 32.52 kg/m    Medical Problem List and Plan: 1.  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  Patient declined  loop recorder  -patient may  shower  -ELOS/Goals: mod I to supervision, 7-9 days 2.  Antithrombotics: -DVT/anticoagulation: Subcutaneous heparin  -antiplatelet therapy: Aspirin 81 mg daily and Plavix 75 mg daily x 3 weeks then aspirin alone 3. Pain Management: Voltaren gel as needed Neurontin 300 mg twice daily as needed 4. Mood: Effexor 75 mg daily, BuSpar 10 mg twice daily, trazodone 50 mg nightly  -antipsychotic agents: N/A 5. Neuropsych: This patient is capable of making decisions on her own behalf. 6. Skin/Wound Care: Routine skin checks 7. Fluids/Electrolytes/Nutrition: Routine in and outs with follow-up chemistries 8.  Hypertension.  Cozaar 50 mg daily, Norvasc 5 mg daily  -borderline control at present 9.  Idiopathic pulmonary fibrosis/COPD.Pirfenidone 801 mg 3 times daily, continue inhalers 10.  Diabetes mellitus.  Lantus insulin 25 units twice daily.  Check blood sugars before meals and at  bedtime  -adjust insulin as necessary 11.  CKD stage III.  Follow-up chemistries 12.  History of rectal cancer.  Follow-up outpatient 13.  Hyperlipidemia.  Crestor 14.  GERD.  Pepcid   Lavon Paganini Angiulli, PA-C 11/06/2020

## 2020-11-06 NOTE — Progress Notes (Signed)
Meredith Staggers, MD  Physician  Physical Medicine and Rehabilitation  Consult Note      Signed  Date of Service:  11/05/2020  5:28 AM      Related encounter: ED to Hosp-Admission (Discharged) from 11/03/2020 in [place] [facility]/SURG UNIT       Signed      Expand All Collapse All     Show:Clear all [x]Manual[x]Template[]Copied  Added by: [x]Angiulli, Lavon Paganini, PA-C[x]Swartz, Celesta Gentile, MD   []Hover for details           Physical Medicine and Rehabilitation Consult Reason for Consult: Left-sided weakness Referring Physician: Dr.Girguis     HPI: Erica [name] is a 65 y.o. 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  Per chart review patient lives with her children.  Independent with assistive device.  1 level home with level entry.  Good supportive family.  Presented 11/03/2020 with persistent headache and left-sided weakness x3 days.  MRI shows punctate acute infarction at the medial right parieto-occipital junction.  Chronic small vessel ischemic changes.  MRA of the head negative for large vessel occlusion.  MRI cervical spine with persistent patchy signal abnormality involving the central/dorsal aspect of the cervical spinal cord extending from C2-C7-T1 relatively stable and unchanged from previous tracings.  Admission chemistries unremarkable except chloride 113 glucose 100, creatinine 1.70, hemoglobin 9.2, hemoglobin A1c 6.3.  Echocardiogram with ejection fraction of 60 to 16% grade 1 diastolic dysfunction.  Currently maintained on aspirin 81 mg daily and Plavix 75 mg daily for CVA prophylaxis x3 weeks then aspirin alone.  Subcutaneous heparin for DVT prophylaxis.  Awaiting plan for loop recorder.  Therapy evaluations completed due to patient decreased functional mobility  left side weakness recommendations of physical medicine rehab consult.     Review of Systems  Constitutional: Negative for chills and fever.  HENT: Negative for hearing loss.   Eyes: Negative for blurred vision and double vision.  Respiratory: Negative for cough and shortness of breath.   Cardiovascular: Negative for chest pain, palpitations and leg swelling.  Gastrointestinal: Positive for constipation. Negative for heartburn, nausea and vomiting.       GERD  Genitourinary: Negative for dysuria, flank pain and hematuria.  Musculoskeletal: Positive for myalgias.  Skin: Negative for rash.  Neurological: Positive for weakness and headaches.  Psychiatric/Behavioral: Positive for depression. The patient has insomnia.        Anxiety  All other systems reviewed and are negative.       Past Medical History:  Diagnosis Date  . Arthritis      especially in shoulders  . Asthma    . Depression    . Diabetes mellitus    . GERD (gastroesophageal reflux disease)    . Headache(784.0)      "mild"  . Hypertension    . Mental disorder      depression  . Neuropathy      feet   . Pain      arthritis pain - takes tramadol as needed  . Rectal polyp      very little bleeding with bowel movements- no pain  . Suicide attempt [facility])           Past Surgical History:  Procedure Laterality Date  . ANAL RECTAL MANOMETRY N/A 07/13/2016    Procedure: ANO RECTAL MANOMETRY;  Surgeon: Leighton Ruff,  MD;  Location: [facility] ENDOSCOPY;  Service: Endoscopy;  Laterality: N/A;  . CESAREAN SECTION      . EUS N/A 11/21/2012    Procedure: LOWER ENDOSCOPIC ULTRASOUND (EUS);  Surgeon: Arta Silence, MD;  Location: [facility];  Service: Endoscopy;  Laterality: N/A;  . FLEXIBLE SIGMOIDOSCOPY N/A 11/21/2012    Procedure: FLEXIBLE SIGMOIDOSCOPY;  Surgeon: Arta Silence, MD;  Location: [facility] ENDOS[facility];  Service: Endoscopy;  Laterality: N/A;  . FLEXIBLE SIGMOIDOSCOPY N/A 01/28/2013    Procedure: FLEXIBLE SIGMOIDOSCOPY;   Surgeon: Leighton Ruff, MD;  Location: [facility];  Service: Endoscopy;  Laterality: N/A;  . LAPAROSCOPIC LOW ANTERIOR RESECTION N/A 01/29/2013    Procedure: LAPAROSCOPIC LOW ANTERIOR RESECTION, Rigid Proctoscopy;  Surgeon: Leighton Ruff, MD;  Location: [facility] ORS;  Service: General;  Laterality: N/A;  . LAPAROSCOPIC SIGMOID COLECTOMY N/A 11/14/2012    Procedure: DIAGNOSTIC LAPAROSCOPY AND SIGMOIDMOIDOSCOPY ;  Surgeon: Rolm Bookbinder, MD;  Location: [facility] ORS;  Service: General;  Laterality: N/A;  . RECTAL ULTRASOUND N/A 07/13/2016    Procedure: RECTAL ULTRASOUND;  Surgeon: Leighton Ruff, MD;  Location: [facility] ENDOS[facility];  Service: Endoscopy;  Laterality: N/A;  . TONSILLECTOMY      . TONSILLECTOMY AND ADENOIDECTOMY             Family History  Problem Relation Age of Onset  . Hypertension Father    . Stroke Father    . Diabetes Father    . Heart disease Mother    . Hypertension Mother    . Hyperlipidemia Mother    . Hypertension Sister    . Hypertension Brother    . Hypertension Sister    . Hypertension Brother    . Cancer Other 47        colon cancer   . Cancer Maternal Aunt          cancer, unknown type   . Cancer Maternal Uncle          bone cancer   . Cancer Paternal Aunt          lung cancer  . Cancer Paternal Uncle          lung cancer  . Cancer Maternal Uncle          colon cancer and prostate cancer   . Cancer Maternal Uncle          prostate cancer     Social History:  reports that she quit smoking about 2 years ago. Her smoking use included cigarettes. She has a 10.00 pack-year smoking history. She has never used smokeless tobacco. She reports previous alcohol use. She reports previous drug use. Drug: "Crack" cocaine. Allergies:       Allergies  Allergen Reactions  . Penicillins Hives, Itching and Swelling      Tongue swells up Has patient had a PCN reaction causing immediate rash, facial/tongue/throat swelling, SOB or lightheadedness with hypotension: Yes Has patient had  a PCN reaction causing severe rash involving mucus membranes or skin necrosis: Yes Has patient had a PCN reaction that required hospitalization Yes Has patient had a PCN reaction occurring within the last 10 years: No If all of the above answers are "NO", then may proceed with Cephalosporin use.            Medications Prior to Admission  Medication Sig Dispense Refill  . acetaminophen (TYLENOL) 325 MG tablet Take 2 tablets (650 mg total) by mouth every 4 (four) hours as needed for mild pain (or temp > 37.5 C (99.5 F)).      Marland Kitchen  albuterol (VENTOLIN HFA) 108 (90 Base) MCG/ACT inhaler Inhale 2 puffs into the lungs every 4 (four) hours as needed for wheezing. 1 g 5  . amLODipine (NORVASC) 10 MG tablet Take 1 tablet (10 mg total) by mouth daily. 30 tablet 2  . BD PEN NEEDLE NANO 2ND GEN 32G X 4 MM MISC See admin instructions.      . blood glucose meter kit and supplies Dispense based on patient and insurance preference. Use up to four times daily as directed. (FOR ICD-10 E10.9, E11.9). 1 each 0  . blood glucose meter kit and supplies Dispense based on patient and insurance preference. Use up to four times daily as directed. (FOR ICD-10 E10.9, E11.9). 1 each 0  . busPIRone (BUSPAR) 10 MG tablet Take 1 tablet (10 mg total) by mouth daily at 6 (six) AM. (Patient taking differently: Take 20 mg by mouth.) 30 tablet 0  . diclofenac Sodium (VOLTAREN) 1 % GEL Apply 2 g topically 4 (four) times daily. (Patient taking differently: Apply 2 g topically daily as needed (Knee pain).) 2 g 0  . famotidine (PEPCID) 20 MG tablet Take 1 tablet (20 mg total) by mouth daily. 30 tablet 0  . fenofibrate (TRICOR) 145 MG tablet TAKE 1 TABLET(145 MG) BY MOUTH DAILY (Patient taking differently: Take 145 mg by mouth daily.) 90 tablet 1  . gabapentin (NEURONTIN) 300 MG capsule Take 1 capsule (300 mg total) by mouth 3 (three) times daily. (Patient taking differently: Take 300 mg by mouth 3 (three) times daily as needed (pain).) 90  capsule 0  . hydrALAZINE (APRESOLINE) 25 MG tablet Take 1.5 tablets (37.5 mg total) by mouth every 8 (eight) hours. 90 tablet 0  . icosapent Ethyl (VASCEPA) 1 g capsule Take 2 capsules (2 g total) by mouth 2 (two) times daily. 360 capsule 0  . losartan-hydrochlorothiazide (HYZAAR) 50-12.5 MG tablet Take 1 tablet by mouth daily.      . metoprolol succinate (TOPROL-XL) 50 MG 24 hr tablet TAKE 1 TABLET(50 MG) BY MOUTH DAILY WITH OR IMMEDIATELY FOLLOWING A MEAL (Patient taking differently: Take 50 mg by mouth daily as needed (For blood pressure).) 90 tablet 3  . Multiple Vitamin (MULTIVITAMIN ADULT PO) Take 1 tablet by mouth daily.      . Pirfenidone (ESBRIET) 267 MG TABS Take 3 tablets (801 mg total) by mouth in the morning, at noon, and at bedtime. 270 tablet 11  . rosuvastatin (CRESTOR) 20 MG tablet Take 20 mg by mouth daily.      . SYMBICORT 160-4.5 MCG/ACT inhaler Inhale 2 puffs into the lungs 2 (two) times daily. 1 each 5  . Tiotropium Bromide Monohydrate (SPIRIVA RESPIMAT) 1.25 MCG/ACT AERS Inhale 2 puffs into the lungs daily. 4 g 5  . TOUJEO SOLOSTAR 300 UNIT/ML Solostar Pen Inject 12 Units into the skin 2 (two) times daily. 50 units  in th A.M. and  40 units subcutaneous in the P.M. 1.5 mL 1  . traZODone (DESYREL) 50 MG tablet Take 50 mg by mouth at bedtime.      Marland Kitchen venlafaxine XR (EFFEXOR-XR) 75 MG 24 hr capsule Take 75 mg by mouth daily with breakfast.          Home: Home Living Family/patient expects to be discharged to:: Private residence Living Arrangements: Children Available Help at Discharge: Available 24 hours/day,Family Type of Home: Apartment Home Access: Level entry Home Layout: One level Bathroom Shower/Tub: Tub/shower unit,Curtain Biochemist, clinical: Handicapped height Home Equipment: Media planner - 2 wheels,Cane - single  point,Bedside commode  Functional History: Prior Function Level of Independence: Independent with assistive device(s) Comments: Uses RW for  ambulation Functional Status:  Mobility: Bed Mobility Overal bed mobility: Needs Assistance Bed Mobility: Supine to Sit,Sit to Supine Supine to sit: Min assist Sit to supine: Min assist General bed mobility comments: Min A for trunk elevation to come to sitting. Required min A for LE assist for return to supine. Transfers Overall transfer level: Needs assistance Equipment used: 1 person hand held assist Transfers: Sit to/from Stand Sit to Stand: Min assist General transfer comment: Min A for steadying assist to stand. Ambulation/Gait Ambulation/Gait assistance: Mod assist Assistive device: 1 person hand held assist General Gait Details: Pt with increased L knee buckling during stance phase. Required manual blocking of L knee to take steps at EOB. Mod A for steadying.   ADL:   Cognition: Cognition Overall Cognitive Status: No family/caregiver present to determine baseline cognitive functioning Orientation Level: Oriented X4 Cognition Arousal/Alertness: Awake/alert Behavior During Therapy: WFL for tasks assessed/performed Overall Cognitive Status: No family/caregiver present to determine baseline cognitive functioning   Blood pressure (!) 142/79, pulse 78, temperature 98.2 F (36.8 C), temperature source Oral, resp. rate 18, height 5' 3" (1.6 m), weight 83.3 kg, SpO2 99 %. Physical Exam Constitutional:      General: She is not in acute distress.    Appearance: Normal appearance.  HENT:     Head: Normocephalic.     Nose: Nose normal.     Mouth/Throat:     Mouth: Mucous membranes are moist.  Eyes:     Extraocular Movements: Extraocular movements intact.     Pupils: Pupils are equal, round, and reactive to light.  Cardiovascular:     Rate and Rhythm: Normal rate and regular rhythm.  Pulmonary:     Effort: Pulmonary effort is normal. No respiratory distress.  Abdominal:     General: There is no distension.     Palpations: Abdomen is soft.  Musculoskeletal:         General: Swelling (mild LUE) present.     Cervical back: Normal range of motion.  Skin:    General: Skin is warm.  Neurological:     Mental Status: She is alert.     Comments: Patient is alert.  Makes eye contact with examiner.  Oriented to person and place.  Follows commands.  Fair awareness and insight.  RUE and RLE 4-5/5. LUE 3 to 3+/5 prox to distal. With decreased LT/pain sense. Mild pronator drift. LLE 3-4/5 prox to distal, sensory more spare.   Psychiatric:        Mood and Affect: Mood normal.        Behavior: Behavior normal.        Lab Results Last 24 Hours       Results for orders placed or performed during the hospital encounter of 11/03/20 (from the past 24 hour(s))  CBG monitoring, ED     Status: None    Collection Time: 11/04/20 12:47 PM  Result Value Ref Range    Glucose-Capillary 87 70 - 99 mg/dL  CBG monitoring, ED     Status: None    Collection Time: 11/04/20  5:09 PM  Result Value Ref Range    Glucose-Capillary 82 70 - 99 mg/dL  Glucose, capillary     Status: Abnormal    Collection Time: 11/04/20  9:40 PM  Result Value Ref Range    Glucose-Capillary 193 (H) 70 - 99 mg/dL  Basic metabolic panel  Status: Abnormal    Collection Time: 11/05/20  3:04 AM  Result Value Ref Range    Sodium 140 135 - 145 mmol/L    Potassium 3.1 (L) 3.5 - 5.1 mmol/L    Chloride 113 (H) 98 - 111 mmol/L    CO2 20 (L) 22 - 32 mmol/L    Glucose, Bld 98 70 - 99 mg/dL    BUN 17 8 - 23 mg/dL    Creatinine, Ser 1.63 (H) 0.44 - 1.00 mg/dL    Calcium 8.7 (L) 8.9 - 10.3 mg/dL    GFR, Estimated 35 (L) >60 mL/min    Anion gap 7 5 - 15  CBC with Differential/Platelet     Status: Abnormal    Collection Time: 11/05/20  3:04 AM  Result Value Ref Range    WBC 3.5 (L) 4.0 - 10.5 K/uL    RBC 2.83 (L) 3.87 - 5.11 MIL/uL    Hemoglobin 9.2 (L) 12.0 - 15.0 g/dL    HCT 25.7 (L) 36.0 - 46.0 %    MCV 90.8 80.0 - 100.0 fL    MCH 32.5 26.0 - 34.0 pg    MCHC 35.8 30.0 - 36.0 g/dL    RDW 15.9 (H) 11.5  - 15.5 %    Platelets 140 (L) 150 - 400 K/uL    nRBC 0.0 0.0 - 0.2 %    Neutrophils Relative % 58 %    Neutro Abs 2.1 1.7 - 7.7 K/uL    Lymphocytes Relative 31 %    Lymphs Abs 1.1 0.7 - 4.0 K/uL    Monocytes Relative 8 %    Monocytes Absolute 0.3 0.1 - 1.0 K/uL    Eosinophils Relative 2 %    Eosinophils Absolute 0.1 0.0 - 0.5 K/uL    Basophils Relative 0 %    Basophils Absolute 0.0 0.0 - 0.1 K/uL    Immature Granulocytes 1 %    Abs Immature Granulocytes 0.02 0.00 - 0.07 K/uL  Magnesium     Status: None    Collection Time: 11/05/20  3:04 AM  Result Value Ref Range    Magnesium 1.7 1.7 - 2.4 mg/dL       Imaging Results (Last 48 hours)  DG Chest 2 View   Result Date: 11/03/2020 CLINICAL DATA:  Left-sided weakness for 3 days. EXAM: CHEST - 2 VIEW COMPARISON:  PA and lateral chest 03/30/2020.  CT chest 04/12/2019. FINDINGS: Extensive pulmonary fibrosis is identified as seen on the prior exams. No consolidative process, pneumothorax or effusion. Heart size is normal. No acute or focal bony abnormality. IMPRESSION: No acute disease. Pulmonary fibrosis. Electronically Signed   By: Inge Rise M.D.   On: 11/03/2020 17:38    MR ANGIO HEAD WO CONTRAST   Result Date: 11/04/2020 CLINICAL DATA:  Follow-up examination for acute stroke. EXAM: MRA HEAD WITHOUT CONTRAST TECHNIQUE: Angiographic images of the Circle of Willis were acquired using MRA technique without intravenous contrast. COMPARISON:  Previous MRI from 11/03/2020 and MRA from 06/25/2020. FINDINGS: ANTERIOR CIRCULATION: Visualized distal cervical segments of the internal carotid arteries are patent with antegrade flow. Petrous segments widely patent bilaterally. Mild atheromatous irregularity throughout the carotid siphons without hemodynamically significant stenosis. A1 segments patent bilaterally. Normal anterior communicating artery complex. Anterior cerebral arteries patent to their distal aspects without stenosis. No M1 stenosis or  occlusion. Normal MCA bifurcations. Distal MCA branches remain well perfused and symmetric. POSTERIOR CIRCULATION: Both vertebral arteries widely patent to the vertebrobasilar junction without stenosis. Both PICA origins patent  and normal. 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 Basilar widely patent distally without stenosis. Superior cerebellar arteries patent bilaterally. Both PCAs primarily supplied via the basilar. Mild atheromatous irregularity throughout the PCAs bilaterally without large vessel occlusion. Short-segment moderate left P2 stenosis, mildly progressed from previous (series 1029, image 8). No intracranial aneurysm. IMPRESSION: 1. Negative MRA for large vessel occlusion. 2. Atheromatous irregularity involving the PCAs bilaterally with superimposed moderate left P2 stenosis, mildly progressed from previous. 3. No other hemodynamically significant or correctable stenosis about the major intracranial arterial vasculature. Electronically Signed   By: Jeannine Boga M.D.   On: 11/04/2020 01:44    MR Brain Wo Contrast (neuro protocol)   Result Date: 11/03/2020 CLINICAL DATA:  Worsening of left arm and leg numbness. Acute infarction in January of this year affecting the right thalamus. EXAM: MRI HEAD WITHOUT CONTRAST TECHNIQUE: Multiplanar, multiecho pulse sequences of the brain and surrounding structures were obtained without intravenous contrast. COMPARISON:  06/25/2010 FINDINGS: Brain: New punctate acute infarction at the right medial parietooccipital junction region. No other acute finding. Chronic small-vessel ischemic changes affect the pons. Few old small vessel cerebellar infarctions. Old small vessel infarctions in the right thalamus, the left thalamus, the basal ganglia and cerebral hemispheric white matter. Scattered old cortical and subcortical infarctions in both occipital lobes and both frontoparietal regions. No mass, hemorrhage, hydrocephalus or  extra-axial collection. Vascular: Major vessels at the base of the brain show flow. Skull and upper cervical spine: Negative Sinuses/Orbits: Clear/normal Other: None IMPRESSION: Newly seen punctate acute infarction at the medial right parietooccipital junction. No other change since the most recent exam of January. Chronic small-vessel ischemic changes of the pons, cerebellum, thalami, basal ganglia and hemispheric white matter. Old cortical and subcortical infarctions in the occipital regions and frontoparietal regions. Electronically Signed   By: Nelson Chimes M.D.   On: 11/03/2020 19:14    MR CERVICAL SPINE WO CONTRAST   Result Date: 11/04/2020 CLINICAL DATA:  Initial evaluation for spinal stenosis. EXAM: MRI CERVICAL SPINE WITHOUT CONTRAST TECHNIQUE: Multiplanar, multisequence MR imaging of the cervical spine was performed. No intravenous contrast was administered. COMPARISON:  Prior MRI from 06/26/2020. FINDINGS: Alignment: Smooth reversal of the normal cervical lordosis with trace stepwise anterolisthesis of C2 on C3 through C4 on C5, stable from previous. Vertebrae: Vertebral body height maintained without acute or interval fracture. Bone marrow signal intensity within normal limits. No discrete or worrisome osseous lesions. Discogenic reactive endplate change noted about the C6-7 interspace. No abnormal marrow edema. Cord: Again seen is patchy signal abnormality involving the central/dorsal aspect of the cervical spinal cord, extending from the level of C2 through C7-T1. Signal change measures up to 3 mm in maximal diameter at the level of C2-3. Overall appearance is relatively unchanged from prior. 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 The underlying cord is again noted to be diffusely atrophic, also stable. Posterior Fossa, vertebral arteries, paraspinal tissues:  Atrophy with chronic microvascular ischemic changes noted within the visualized brain. Craniocervical junction within normal limits. Paraspinous and prevertebral soft tissues within normal limits. Normal flow voids seen within the vertebral arteries bilaterally. Disc levels: C2-C3: Trace anterolisthesis. Tiny central disc protrusion minimally indents the ventral thecal sac. No spinal stenosis. Foramina are widely patent. C3-C4: Trace anterolisthesis. Shallow central disc protrusion minimally indents the ventral thecal sac. Left-sided facet degeneration. No spinal stenosis. Foramina remain widely patent. C4-C5:  Trace anterolisthesis. Small central disc protrusion indents the ventral thecal sac. Mild flattening of the ventral cord without significant spinal stenosis. Foramina remain widely patent. C5-C6: Degenerative intervertebral disc space narrowing. Broad-based central disc osteophyte complex indents the ventral thecal sac, asymmetric to the left. Mild flattening of the ventral cord with resultant mild spinal stenosis. Foramina remain patent. Appearance is stable. C6-C7: Degenerative intervertebral disc space narrowing. Broad-based central disc osteophyte complex indents the ventral thecal sac, contacting and flattening the ventral spinal cord. Mild spinal stenosis. Foramina remain patent. Appearance is stable. C7-T1: Right paracentral disc extrusion with both superior and inferior migration, slightly decreased in size and regressed as compared to previous. Disc material continues to contact the right ventral cord with mild cord flattening (series 9, image 27). Persistent mild spinal stenosis. Superimposed mild facet and ligament flavum hypertrophy. Foramina remain patent. T1-2: Normal interspace. Mild left greater than right facet hypertrophy. No stenosis. T2-3: Small right paracentral disc extrusion mildly indents the right ventral thecal sac. Slight superior migration of disc material. No significant spinal  stenosis. Foramina remain patent. IMPRESSION: 1. Persistent patchy signal abnormality involving the central/dorsal aspect of the cervical spinal cord, extending from C2 through C7-T1, relatively stable and unchanged from previous. 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 previous myelitis). The underlying cord is diffusely atrophic, also stable. 2. Slight interval regression of right paracentral disc extrusion at C7-T1, with persistent mild spinal stenosis and right-sided cord flattening at this level. 3. Otherwise stable multilevel cervical spondylosis with resultant mild spinal stenosis at C5-6 and C6-7. Electronically Signed   By: Jeannine Boga M.D.   On: 11/04/2020 01:18    ECHOCARDIOGRAM COMPLETE   Result Date: 11/04/2020    ECHOCARDIOGRAM REPORT   Patient Name:   Erica [name] Date of Exam: 11/04/2020 Medical Rec #:  [id number]    Height:       63.0 in Accession #:    [id number]   Weight:       183.6 lb Date of Birth:  13-Mar-1956    BSA:          1.865 m Patient Age:    52 years     BP:           157/76 mmHg Patient Gender: F            HR:           50 bpm. Exam Location:  Inpatient Procedure: 2D Echo, Cardiac Doppler and Color Doppler Indications:    CVA  History:        Patient has prior history of Echocardiogram examinations, most                 recent 06/26/2020. Risk Factors:Hypertension and Diabetes.  Sonographer:    Luisa Hart RDCS Referring Phys: [id number] [place]  1. Left ventricular ejection fraction, by estimation, is 60 to 65%. The left ventricle has normal function. The left ventricle has no regional wall motion abnormalities. There is moderate concentric left ventricular hypertrophy. Left ventricular diastolic parameters are consistent with Grade I diastolic dysfunction (impaired relaxation). Elevated left atrial pressure.  2. Right  ventricular systolic function is normal. The right ventricular size is normal.  3. The mitral valve is normal in structure. No evidence of mitral valve regurgitation. No evidence of mitral stenosis.  4. The aortic valve is tricuspid. There is mild calcification of the  aortic valve. There is mild thickening of the aortic valve. Aortic valve regurgitation is mild. Mild aortic valve sclerosis is present, with no evidence of aortic valve stenosis.  5. The inferior vena cava is normal in size with greater than 50% respiratory variability, suggesting right atrial pressure of 3 mmHg. Comparison(s): No significant change from prior study. Prior images reviewed side by side. FINDINGS  Left Ventricle: Left ventricular ejection fraction, by estimation, is 60 to 65%. The left ventricle has normal function. The left ventricle has no regional wall motion abnormalities. The left ventricular internal cavity size was normal in size. There is  moderate concentric left ventricular hypertrophy. Left ventricular diastolic parameters are consistent with Grade I diastolic dysfunction (impaired relaxation). Elevated left atrial pressure. Right Ventricle: The right ventricular size is normal. No increase in right ventricular wall thickness. Right ventricular systolic function is normal. Left Atrium: Left atrial size was normal in size. Right Atrium: Right atrial size was normal in size. Pericardium: There is no evidence of pericardial effusion. Mitral Valve: The mitral valve is normal in structure. There is mild thickening of the mitral valve leaflet(s). No evidence of mitral valve regurgitation. No evidence of mitral valve stenosis. MV peak gradient, 5.4 mmHg. The mean mitral valve gradient is  1.0 mmHg. Tricuspid Valve: The tricuspid valve is normal in structure. Tricuspid valve regurgitation is not demonstrated. No evidence of tricuspid stenosis. Aortic Valve: The aortic valve is tricuspid. There is mild calcification of the aortic  valve. There is mild thickening of the aortic valve. Aortic valve regurgitation is mild. Aortic regurgitation PHT measures 754 msec. Mild aortic valve sclerosis is present, with no evidence of aortic valve stenosis. Aortic valve mean gradient measures 4.0 mmHg. Aortic valve peak gradient measures 8.1 mmHg. Aortic valve area, by VTI measures 1.57 cm. Pulmonic Valve: The pulmonic valve was normal in structure. Pulmonic valve regurgitation is not visualized. No evidence of pulmonic stenosis. Aorta: The aortic root is normal in size and structure. Venous: The inferior vena cava is normal in size with greater than 50% respiratory variability, suggesting right atrial pressure of 3 mmHg. IAS/Shunts: No atrial level shunt detected by color flow Doppler.  LEFT VENTRICLE PLAX 2D LVIDd:         4.10 cm     Diastology LVIDs:         2.50 cm     LV e' medial:    3.73 cm/s LV PW:         1.50 cm     LV E/e' medial:  18.3 LV IVS:        1.50 cm     LV e' lateral:   4.54 cm/s LVOT diam:     1.80 cm     LV E/e' lateral: 15.0 LV SV:         56 LV SV Index:   30 LVOT Area:     2.54 cm  LV Volumes (MOD) LV vol d, MOD A2C: 33.1 ml LV vol d, MOD A4C: 54.0 ml LV vol s, MOD A2C: 17.1 ml LV vol s, MOD A4C: 14.4 ml LV SV MOD A2C:     16.0 ml LV SV MOD A4C:     54.0 ml LV SV MOD BP:      27.8 ml RIGHT VENTRICLE RV Basal diam:  3.40 cm     PULMONARY VEINS RV Mid diam:    2.20 cm     A Reversal Duration: 113.00 msec RV S prime:  16.50 cm/s  A Reversal Velocity: 27.70 cm/s TAPSE (M-mode): 2.4 cm      Diastolic Velocity:  16.10 cm/s                             S/D Velocity:        1.30                             Systolic Velocity:   96.04 cm/s LEFT ATRIUM             Index LA diam:        2.90 cm 1.56 cm/m LA Vol (A2C):   53.3 ml 28.59 ml/m LA Vol (A4C):   51.4 ml 27.57 ml/m LA Biplane Vol: 55.2 ml 29.60 ml/m  AORTIC VALVE                   PULMONIC VALVE AV Area (Vmax):    1.58 cm    PV Vmax:       0.72 m/s AV Area (Vmean):   1.64 cm     PV Vmean:      57.200 cm/s AV Area (VTI):     1.57 cm    PV VTI:        0.209 m AV Vmax:           142.00 cm/s PV Peak grad:  2.0 mmHg AV Vmean:          88.100 cm/s PV Mean grad:  1.0 mmHg AV VTI:            0.359 m AV Peak Grad:      8.1 mmHg AV Mean Grad:      4.0 mmHg LVOT Vmax:         88.40 cm/s LVOT Vmean:        56.700 cm/s LVOT VTI:          0.222 m LVOT/AV VTI ratio: 0.62 AI PHT:            754 msec  AORTA Ao Root diam: 3.50 cm Ao Asc diam:  3.40 cm MITRAL VALVE MV Area (PHT): 3.17 cm    SHUNTS MV Area VTI:   1.46 cm    Systemic VTI:  0.22 m MV Peak grad:  5.4 mmHg    Systemic Diam: 1.80 cm MV Mean grad:  1.0 mmHg MV Vmax:       1.16 m/s MV Vmean:      50.8 cm/s MV Decel Time: 239 msec MV E velocity: 68.10 cm/s MV A velocity: 99.80 cm/s MV E/A ratio:  0.68 Mihai Croitoru MD Electronically signed by Sanda Klein MD Signature Date/Time: 11/04/2020/3:40:47 PM    Final          Assessment/Plan: Diagnosis: 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  1. Does the need for close, 24 hr/day medical supervision in concert with the patient's rehab needs make it unreasonable for this patient to be served in a less intensive setting? Yes 2. Co-Morbidities requiring supervision/potential complications: DM, COPD, HTN, CKD III 3. Due to bladder management, bowel management, safety, skin/wound care, disease management, medication administration, pain management and patient education, does the patient require 24 hr/day rehab nursing? Yes 4. Does the patient require coordinated care of a physician, rehab nurse, therapy disciplines of PT, OT, SLP to address physical and functional deficits in the context  of the above medical diagnosis(es)? Yes Addressing deficits in the following areas: balance, endurance, locomotion, strength, transferring, bowel/bladder control, bathing, dressing, feeding, grooming, toileting and psychosocial  support 5. Can the patient actively participate in an intensive therapy program of at least 3 hrs of therapy per day at least 5 days per week? Yes 6. The potential for patient to make measurable gains while on inpatient rehab is excellent 7. Anticipated functional outcomes upon discharge from inpatient rehab are supervision  with PT, supervision with OT, n/a with SLP. 8. Estimated rehab length of stay to reach the above functional goals is: 7-11 days 9. Anticipated discharge destination: Home 10. Overall Rehab/Functional Prognosis: excellent   RECOMMENDATIONS: This patient's condition is appropriate for continued rehabilitative care in the following setting: CIR Patient has agreed to participate in recommended program. Potentially Note that insurance prior authorization may be required for reimbursement for recommended care.   Comment: Pt prefers to go home. Her son is there. She doesn't feel that she's too far from her baseline prior to admit. I think she could benefit from an admission to improve functional mobility, balance, safety, etc. Rehab Admissions Coordinator to follow up.   Thanks,   Meredith Staggers, MD, Mellody Drown   I have personally performed a face to face diagnostic evaluation of this patient. Additionally, I have examined pertinent labs and radiographic images. I have reviewed and concur with the physician assistant's documentation above.    Cathlyn Parsons, PA-C 11/05/2020          Revision History                        Routing History                   Note Details  Author Meredith Staggers, MD File Time 11/05/2020 11:07 AM  Author Type Physician Status Signed  Last Editor Meredith Staggers, MD Service Physical Medicine and Rehabilitation

## 2020-11-06 NOTE — Progress Notes (Signed)
Met with the patient to re-introduce self and role of the nurse CM. Reviewed therapy schedule, team conference and plan of care. Discussed secondary stroke risks including previous stroke, HTN, HLD, prediabetes and DAPT. Handouts given on prediabetes and high protein food options for lower albumin level. Continue to follow along to discharge to address educational needs and collaborate with the SW to facilitate preparation for discharge. Margarito Liner

## 2020-11-06 NOTE — Progress Notes (Signed)
Physical Therapy Treatment Patient Details Name: Erica [name] MRN: [id number] DOB: August 23, 1955 Today's Date: 11/06/2020    History of Present Illness Pt is a 65 y/o female admitted 5/24 secondary to L hand and face numbness and LLE weakness. Found to have R parietoocipital junction infarct. PMH includes CVA, DM, rectal cancer, COPD, MDD, HTN, and CKD.    PT Comments    Patient received sitting up on sofa with sister present in room. She is going through items in closet. She is agreeable to PT session. Patient performed sit to stand from sofa with min assist. Ambulated 125 feet with RW and min guard. Several brief standing rest breaks needed on walk back due to fatigue. No buckling of left knee noted with ambulation. No lob. Patient will continue to benefit from skilled PT while here to improve functional independence, strength and safety with mobility for return home.     Follow Up Recommendations  CIR     Equipment Recommendations  None recommended by PT    Recommendations for Other Services       Precautions / Restrictions Precautions Precautions: Fall Precaution Comments: mod fall Restrictions Weight Bearing Restrictions: No    Mobility  Bed Mobility               General bed mobility comments: Not assessed, patient received sitting on sofa in room with sister present and returned to sofa    Transfers Overall transfer level: Needs assistance Equipment used: Rolling walker (2 wheeled) Transfers: Sit to/from Stand Sit to Stand: Min guard            Ambulation/Gait Ambulation/Gait assistance: Min guard Gait Distance (Feet): 125 Feet Assistive device: Rolling walker (2 wheeled)   Gait velocity: decr   General Gait Details: improved ability with ambulation this visit. No buckling of L knee noted this visit. Her left LE is weaker with ambulation and she fatigued significantly with this distance.   Stairs             Wheelchair Mobility    Modified  Rankin (Stroke Patients Only) Modified Rankin (Stroke Patients Only) Pre-Morbid Rankin Score: Slight disability Modified Rankin: Moderately severe disability     Balance Overall balance assessment: Needs assistance Sitting-balance support: Feet supported Sitting balance-Leahy Scale: Good     Standing balance support: Bilateral upper extremity supported;During functional activity Standing balance-Leahy Scale: Fair Standing balance comment: Reliant on UE and min guard                            Cognition Arousal/Alertness: Awake/alert Behavior During Therapy: WFL for tasks assessed/performed Overall Cognitive Status: Impaired/Different from baseline Area of Impairment: Awareness;Safety/judgement                         Safety/Judgement: Decreased awareness of safety            Exercises Other Exercises Other Exercises: LLE exercises: Seated LAQ, Marching x 10 reps    General Comments        Pertinent Vitals/Pain Pain Assessment: No/denies pain    Home Living                      Prior Function            PT Goals (current goals can now be found in the care plan section) Acute Rehab PT Goals Patient Stated Goal: to be independent, go home PT Goal  Formulation: With patient Time For Goal Achievement: 11/18/20 Potential to Achieve Goals: Good Progress towards PT goals: Progressing toward goals    Frequency    Min 4X/week      PT Plan Current plan remains appropriate    Co-evaluation              AM-PAC PT "6 Clicks" Mobility   Outcome Measure  Help needed turning from your back to your side while in a flat bed without using bedrails?: A Little Help needed moving from lying on your back to sitting on the side of a flat bed without using bedrails?: A Little Help needed moving to and from a bed to a chair (including a wheelchair)?: A Little Help needed standing up from a chair using your arms (e.g., wheelchair or  bedside chair)?: A Little Help needed to walk in hospital room?: A Little Help needed climbing 3-5 steps with a railing? : A Lot 6 Click Score: 17    End of Session Equipment Utilized During Treatment: Gait belt Activity Tolerance: Patient tolerated treatment well;Patient limited by fatigue Patient left: Other (comment);with family/visitor present (left on sofa) Nurse Communication: Mobility status PT Visit Diagnosis: Unsteadiness on feet (R26.81);Muscle weakness (generalized) (M62.81);Difficulty in walking, not elsewhere classified (R26.2)     Time: 6948-5462 PT Time Calculation (min) (ACUTE ONLY): 17 min  Charges:  $Gait Training: 8-22 mins                     Tylin Stradley, PT, GCS 11/06/20,2:05 PM

## 2020-11-06 NOTE — Discharge Summary (Signed)
Physician Discharge Summary  Erica [name] ERD:[id number] DOB: 07/03/1955 DOA: 11/03/2020  PCP: Nolene Ebbs, MD  Admit date: 11/03/2020 Discharge date: 11/06/2020  Admitted From: Home Disposition: CIR  Recommendations for Outpatient Follow-up:  1. Follow up with cardiology as below 2. Outpatient follow-up with urology in 4 to 6 weeks 3. Please obtain CBC/BMP/Mag at follow up 4. Please follow up on the following pending results: None   Discharge Condition: Stable CODE STATUS: Full code   Follow-up Information    Tolia, Sunit, DO Follow up.   Specialties: Cardiology, Radiology, Vascular Surgery Why: 11/25/20 @ 11:30AM Contact information: [place] [address] [address] Hospital Course: 65 year old F with PMH of recent right thalamic CVA in 06/2020, cervical hydrosyringomyelia, COPD, pulmonary fibrosis, CAD, rectal cancer, CKD-3B, DM-2 and depression presenting with 3 days of left hand and face numbness and LLE weakness for 3 days, and right-sided headache that she describes as "brain freeze" for about 4 weeks.  In ED, hypertensive to 180/80.  Bradycardic to 40s.  MRI brain demonstrated new acute infarction at the medial right parieto-occipital junction in addition to chronic small vessel ischemic changes.  Neurology consulted.    Patient has had routine CVA work-up with MRA head, carotid Dopplers and echocardiogram without significant finding.  MRI cervical spine was done and showed stable and persistent patchy signal abnormality involving the central and dorsal aspect of cervical spinal cord extending from C2 through C7-T1.  Neurology recommended DAPT for 3 weeks followed by aspirin alone.   Patient cleared for discharge by neurology, and discharged to CIR per therapy recommendation. Cardiology arranged outpatient follow-up with loop recorder   See individual problem list below for more on hospital course.  Discharge  Diagnoses:  Acute right parieto-occipital ischemic CVA-at least 3 days of symptoms prior to arrival.  Outside tPA window.  Still with some residual numbness in left face and left arm.  Not sure about the cause of a LLE weakness.  TTE with LVEF of 60 to 65% and G1 DD but no thrombus.  MRA and carotid US without flow obstructing stenosis. -Outpatient follow-up with cardiology for loop recorder since patient is undecided now -DAPT with Plavix and aspirin for 3 weeks followed by aspirin alone -Continue Crestor -Continue therapy at CIR  Hydrosyringomyelia: MRI C-spine showed stable and unchanged patchy persistent signal abnormality extending from C2 through C7-T1 -Outpatient follow-up with neurosurgery as needed  Hypertension: BP slightly elevated. Chronic diastolic CHF: TTE with LVEF of 60-65% and G1 DD and elevated left atrial pressure.  Euvolemic -Slowly normalize BP -Continue home losartan and amlodipine -Continue holding hydralazine for few more days -Consider resuming metoprolol if bradycardia resolves -Discontinued HCTZ  CKD IIIb: Stable.               Recent Labs    06/30/20 0513 07/01/20 0705 07/03/20 0529 07/06/20 0514 07/07/20 0534 07/10/20 0748 07/14/20 0448 07/29/20 0000 11/03/20 1632 11/04/20 0418 11/05/20 0304 11/06/20 0230  BUN 30* 27* 35* 30*  --  27*  --  _0 CREATININE 1.74* 1.91* 1.87* 1.71* 1.80* 1.78* 1.91* 1.53* 1.80* 1.70* 1.63* 1.71*  -Monitor intermittently  Chronic COPD/interstitial pulmonary fibrosis: No cardiopulmonary symptoms. -Continuepirfenidone,ICS/LABA, Spiriva, and as-needed albuterol  Anxiety and depression: Stable -Stable, continue home regimen  Sinus bradycardia: Rate upper 40s to 50s in ED.  Likely due to beta-blocker.  Improved. - Hold  metoprolol  Controlled IDDM-2 with hyperglycemia and other complications: K9F 8.1%.        Recent Labs  Lab 11/05/20 1137 11/05/20 1615 11/05/20 2145 11/06/20 0612  11/06/20 1108  GLUCAP 183* 194* 127* 108* 129*  -Continue Lantus 25 units twice daily -Continue SSI-sensitive -Continue Crestor  Pancytopenia-has mild leukopenia without neutropenia, anemia and thrombocytopenia.  Platelet clumping noted on smears on 5/25.  Overall, anemia and thrombocytopenia seems to be at baseline. -Recheck CBC -Check anemia panel  Class I obesity Body mass index is 32.52 kg/m.            Discharge Exam: Vitals:   11/06/20 0820 11/06/20 1205  BP: (!) 159/73 (!) 165/81  Pulse: (!) 59 84  Resp: 18 18  Temp: 98.3 F (36.8 C) 98.4 F (36.9 C)  SpO2: 99% 97%   GENERAL: No apparent distress.  Nontoxic. HEENT: MMM.  Vision and hearing grossly intact.  NECK: Supple.  No apparent JVD.  RESP: On RA.  No IWOB.  Fair aeration bilaterally. CVS:  RRR. Heart sounds normal.  ABD/GI/GU: BS+. Abd soft, NTND.  MSK/EXT:  Moves extremities. No apparent deformity. No edema.  SKIN: no apparent skin lesion or wound NEURO: Awake, alert and oriented appropriately. Speech clear. Cranial nerves II-XII grossly intact.  Motor 4/5 in RLE and 5/5 elsewhere.  Diminished light sensation in left upper extremity.  Patellar reflex symmetric.  No pronator drift.  Finger to nose slightly off on the left. PSYCH: Calm. Normal affect.   Discharge Instructions   Allergies as of 11/06/2020      Reactions   Penicillins Hives, Itching, Swelling   Tongue swells up Has patient had a PCN reaction causing immediate rash, facial/tongue/throat swelling, SOB or lightheadedness with hypotension: Yes Has patient had a PCN reaction causing severe rash involving mucus membranes or skin necrosis: Yes Has patient had a PCN reaction that required hospitalization Yes Has patient had a PCN reaction occurring within the last 10 years: No If all of the above answers are "NO", then may proceed with Cephalosporin use.      Medication List    STOP taking these medications   fenofibrate 145 MG  tablet Commonly known as: TRICOR   icosapent Ethyl 1 g capsule Commonly known as: Vascepa   losartan-hydrochlorothiazide 50-12.5 MG tablet Commonly known as: HYZAAR   metoprolol succinate 50 MG 24 hr tablet Commonly known as: TOPROL-XL   Toujeo SoloStar 300 UNIT/ML Solostar Pen Generic drug: insulin glargine (1 Unit Dial) Replaced by: insulin glargine 100 UNIT/ML injection     TAKE these medications   acetaminophen 325 MG tablet Commonly known as: TYLENOL Take 2 tablets (650 mg total) by mouth every 4 (four) hours as needed for mild pain (or temp > 37.5 C (99.5 F)).   albuterol 108 (90 Base) MCG/ACT inhaler Commonly known as: VENTOLIN HFA Inhale 2 puffs into the lungs every 4 (four) hours as needed for wheezing.   amLODipine 10 MG tablet Commonly known as: NORVASC Take 1 tablet (10 mg total) by mouth daily.   aspirin 81 MG EC tablet Take 1 tablet (81 mg total) by mouth daily. Swallow whole. Start taking on: Nov 07, 2020   BD Pen Needle Nano 2nd Gen 32G X 4 MM Misc Generic drug: Insulin Pen Needle See admin instructions.   blood glucose meter kit and supplies Dispense based on patient and insurance preference. Use up to four times daily as directed. (FOR ICD-10 E10.9, E11.9).   blood glucose meter kit and  supplies Dispense based on patient and insurance preference. Use up to four times daily as directed. (FOR ICD-10 E10.9, E11.9).   busPIRone 10 MG tablet Commonly known as: BUSPAR Take 1 tablet (10 mg total) by mouth daily at 6 (six) AM. What changed:   how much to take  when to take this   clopidogrel 75 MG tablet Commonly known as: PLAVIX Take 1 tablet (75 mg total) by mouth daily for 21 days. Start taking on: Nov 07, 2020   diclofenac Sodium 1 % Gel Commonly known as: VOLTAREN Apply 2 g topically 4 (four) times daily. What changed:   when to take this  reasons to take this   Esbriet 267 MG Tabs Generic drug: Pirfenidone Take 3 tablets (801 mg  total) by mouth in the morning, at noon, and at bedtime.   famotidine 20 MG tablet Commonly known as: PEPCID Take 1 tablet (20 mg total) by mouth daily.   gabapentin 300 MG capsule Commonly known as: NEURONTIN Take 1 capsule (300 mg total) by mouth 3 (three) times daily. What changed:   when to take this  reasons to take this   hydrALAZINE 25 MG tablet Commonly known as: APRESOLINE Take 1.5 tablets (37.5 mg total) by mouth every 8 (eight) hours. Start taking on: Nov 09, 2020 What changed: These instructions start on Nov 09, 2020. If you are unsure what to do until then, ask your doctor or other care provider.   insulin glargine 100 UNIT/ML injection Commonly known as: LANTUS Inject 0.25 mLs (25 Units total) into the skin 2 (two) times daily. Replaces: Toujeo SoloStar 300 UNIT/ML Solostar Pen   losartan 50 MG tablet Commonly known as: COZAAR Take 1 tablet (50 mg total) by mouth daily. Start taking on: Nov 07, 2020   MULTIVITAMIN ADULT PO Take 1 tablet by mouth daily.   NovoLOG 100 UNIT/ML injection Generic drug: insulin aspart Inject 1-9 Units into the skin 3 (three) times daily with meals. CBG < 70: Implement Hypoglycemia Standing Orders and refer to Hypoglycemia Standing Orders sidebar report CBG 70 - 120: 0 units CBG 121 - 150: 1 unit CBG 151 - 200: 2 units CBG 201 - 250: 3 units CBG 251 - 300: 5 units CBG 301 - 350: 7 units CBG 351 - 400: 9 units CBG > 400: call MD and obtain STAT lab verification   rosuvastatin 20 MG tablet Commonly known as: CRESTOR Take 20 mg by mouth daily.   senna-docusate 8.6-50 MG tablet Commonly known as: Senokot-S Take 1 tablet by mouth at bedtime as needed for mild constipation.   Spiriva Respimat 1.25 MCG/ACT Aers Generic drug: Tiotropium Bromide Monohydrate Inhale 2 puffs into the lungs daily.   Symbicort 160-4.5 MCG/ACT inhaler Generic drug: budesonide-formoterol Inhale 2 puffs into the lungs 2 (two) times daily.    traZODone 50 MG tablet Commonly known as: DESYREL Take 50 mg by mouth at bedtime.   venlafaxine XR 75 MG 24 hr capsule Commonly known as: EFFEXOR-XR Take 75 mg by mouth daily with breakfast.       Consultations:  Neurology  Cardiology  Procedures/Studies:  2D Echo on 11/04/2020 1. Left ventricular ejection fraction, by estimation, is 60 to 65%. The  left ventricle has normal function. The left ventricle has no regional  wall motion abnormalities. There is moderate concentric left ventricular  hypertrophy. Left ventricular  diastolic parameters are consistent with Grade I diastolic dysfunction  (impaired relaxation). Elevated left atrial pressure.  2. Right ventricular systolic function is  normal. The right ventricular  size is normal.  3. The mitral valve is normal in structure. No evidence of mitral valve  regurgitation. No evidence of mitral stenosis.  4. The aortic valve is tricuspid. There is mild calcification of the  aortic valve. There is mild thickening of the aortic valve. Aortic valve  regurgitation is mild. Mild aortic valve sclerosis is present, with no  evidence of aortic valve stenosis.  5. The inferior vena cava is normal in size with greater than 50%  respiratory variability, suggesting right atrial pressure of 3 mmHg.    DG Chest 2 View  Result Date: 11/03/2020 CLINICAL DATA:  Left-sided weakness for 3 days. EXAM: CHEST - 2 VIEW COMPARISON:  PA and lateral chest 03/30/2020.  CT chest 04/12/2019. FINDINGS: Extensive pulmonary fibrosis is identified as seen on the prior exams. No consolidative process, pneumothorax or effusion. Heart size is normal. No acute or focal bony abnormality. IMPRESSION: No acute disease. Pulmonary fibrosis. Electronically Signed   By: Inge Rise M.D.   On: 11/03/2020 17:38   MR ANGIO HEAD WO CONTRAST  Result Date: 11/04/2020 CLINICAL DATA:  Follow-up examination for acute stroke. EXAM: MRA HEAD WITHOUT CONTRAST  TECHNIQUE: Angiographic images of the Circle of Willis were acquired using MRA technique without intravenous contrast. COMPARISON:  Previous MRI from 11/03/2020 and MRA from 06/25/2020. FINDINGS: ANTERIOR CIRCULATION: Visualized distal cervical segments of the internal carotid arteries are patent with antegrade flow. Petrous segments widely patent bilaterally. Mild atheromatous irregularity throughout the carotid siphons without hemodynamically significant stenosis. A1 segments patent bilaterally. Normal anterior communicating artery complex. Anterior cerebral arteries patent to their distal aspects without stenosis. No M1 stenosis or occlusion. Normal MCA bifurcations. Distal MCA branches remain well perfused and symmetric. POSTERIOR CIRCULATION: Both vertebral arteries widely patent to the vertebrobasilar junction without stenosis. Both PICA origins patent and normal. 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 Basilar widely patent distally without stenosis. Superior cerebellar arteries patent bilaterally. Both PCAs primarily supplied via the basilar. Mild atheromatous irregularity throughout the PCAs bilaterally without large vessel occlusion. Short-segment moderate left P2 stenosis, mildly progressed from previous (series 1029, image 8). No intracranial aneurysm. IMPRESSION: 1. Negative MRA for large vessel occlusion. 2. Atheromatous irregularity involving the PCAs bilaterally with superimposed moderate left P2 stenosis, mildly progressed from previous. 3. No other hemodynamically significant or correctable stenosis about the major intracranial arterial vasculature. Electronically Signed   By: Jeannine Boga M.D.   On: 11/04/2020 01:44   MR Brain Wo Contrast (neuro protocol)  Result Date: 11/03/2020 CLINICAL DATA:  Worsening of left arm and leg numbness. Acute infarction in January of this year affecting the right thalamus. EXAM: MRI HEAD WITHOUT CONTRAST TECHNIQUE:  Multiplanar, multiecho pulse sequences of the brain and surrounding structures were obtained without intravenous contrast. COMPARISON:  06/25/2010 FINDINGS: Brain: New punctate acute infarction at the right medial parietooccipital junction region. No other acute finding. Chronic small-vessel ischemic changes affect the pons. Few old small vessel cerebellar infarctions. Old small vessel infarctions in the right thalamus, the left thalamus, the basal ganglia and cerebral hemispheric white matter. Scattered old cortical and subcortical infarctions in both occipital lobes and both frontoparietal regions. No mass, hemorrhage, hydrocephalus or extra-axial collection. Vascular: Major vessels at the base of the brain show flow. Skull and upper cervical spine: Negative Sinuses/Orbits: Clear/normal Other: None IMPRESSION: Newly seen punctate acute infarction at the medial right parietooccipital junction. No other change since the most recent exam of January. Chronic small-vessel ischemic changes  of the pons, cerebellum, thalami, basal ganglia and hemispheric white matter. Old cortical and subcortical infarctions in the occipital regions and frontoparietal regions. Electronically Signed   By: Nelson Chimes M.D.   On: 11/03/2020 19:14   MR CERVICAL SPINE WO CONTRAST  Result Date: 11/04/2020 CLINICAL DATA:  Initial evaluation for spinal stenosis. EXAM: MRI CERVICAL SPINE WITHOUT CONTRAST TECHNIQUE: Multiplanar, multisequence MR imaging of the cervical spine was performed. No intravenous contrast was administered. COMPARISON:  Prior MRI from 06/26/2020. FINDINGS: Alignment: Smooth reversal of the normal cervical lordosis with trace stepwise anterolisthesis of C2 on C3 through C4 on C5, stable from previous. Vertebrae: Vertebral body height maintained without acute or interval fracture. Bone marrow signal intensity within normal limits. No discrete or worrisome osseous lesions. Discogenic reactive endplate change noted about  the C6-7 interspace. No abnormal marrow edema. Cord: Again seen is patchy signal abnormality involving the central/dorsal aspect of the cervical spinal cord, extending from the level of C2 through C7-T1. Signal change measures up to 3 mm in maximal diameter at the level of C2-3. Overall appearance is relatively unchanged from prior. 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 The underlying cord is again noted to be diffusely atrophic, also stable. Posterior Fossa, vertebral arteries, paraspinal tissues: Atrophy with chronic microvascular ischemic changes noted within the visualized brain. Craniocervical junction within normal limits. Paraspinous and prevertebral soft tissues within normal limits. Normal flow voids seen within the vertebral arteries bilaterally. Disc levels: C2-C3: Trace anterolisthesis. Tiny central disc protrusion minimally indents the ventral thecal sac. No spinal stenosis. Foramina are widely patent. C3-C4: Trace anterolisthesis. Shallow central disc protrusion minimally indents the ventral thecal sac. Left-sided facet degeneration. No spinal stenosis. Foramina remain widely patent. C4-C5: Trace anterolisthesis. Small central disc protrusion indents the ventral thecal sac. Mild flattening of the ventral cord without significant spinal stenosis. Foramina remain widely patent. C5-C6: Degenerative intervertebral disc space narrowing. Broad-based central disc osteophyte complex indents the ventral thecal sac, asymmetric to the left. Mild flattening of the ventral cord with resultant mild spinal stenosis. Foramina remain patent. Appearance is stable. C6-C7: Degenerative intervertebral disc space narrowing. Broad-based central disc osteophyte complex indents the ventral thecal sac, contacting and flattening the ventral spinal cord. Mild spinal stenosis. Foramina remain  patent. Appearance is stable. C7-T1: Right paracentral disc extrusion with both superior and inferior migration, slightly decreased in size and regressed as compared to previous. Disc material continues to contact the right ventral cord with mild cord flattening (series 9, image 27). Persistent mild spinal stenosis. Superimposed mild facet and ligament flavum hypertrophy. Foramina remain patent. T1-2: Normal interspace. Mild left greater than right facet hypertrophy. No stenosis. T2-3: Small right paracentral disc extrusion mildly indents the right ventral thecal sac. Slight superior migration of disc material. No significant spinal stenosis. Foramina remain patent. IMPRESSION: 1. Persistent patchy signal abnormality involving the central/dorsal aspect of the cervical spinal cord, extending from C2 through C7-T1, relatively stable and unchanged from previous. 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 previous myelitis). The underlying cord is diffusely atrophic, also stable. 2. Slight interval regression of right paracentral disc extrusion at C7-T1, with persistent mild spinal stenosis and right-sided cord flattening at this level. 3. Otherwise stable multilevel cervical spondylosis with resultant mild spinal stenosis at C5-6 and C6-7. Electronically Signed   By:  Jeannine Boga M.D.   On: 11/04/2020 01:18   ECHOCARDIOGRAM COMPLETE  Result Date: 11/04/2020    ECHOCARDIOGRAM REPORT   Patient Name:   Erica [name] Date of Exam: 11/04/2020 Medical Rec #:  [id number]    Height:       63.0 in Accession #:    [id number]   Weight:       183.6 lb Date of Birth:  [birth year]    BSA:          1.865 m Patient Age:    25 years     BP:           157/76 mmHg Patient Gender: F            HR:           50 bpm. Exam Location:  Inpatient Procedure: 2D Echo, Cardiac Doppler and Color Doppler  Indications:    CVA  History:        Patient has prior history of Echocardiogram examinations, most                 recent 06/26/2020. Risk Factors:Hypertension and Diabetes.  Sonographer:    Luisa Hart RDCS Referring Phys: [id number] Wilber  1. Left ventricular ejection fraction, by estimation, is 60 to 65%. The left ventricle has normal function. The left ventricle has no regional wall motion abnormalities. There is moderate concentric left ventricular hypertrophy. Left ventricular diastolic parameters are consistent with Grade I diastolic dysfunction (impaired relaxation). Elevated left atrial pressure.  2. Right ventricular systolic function is normal. The right ventricular size is normal.  3. The mitral valve is normal in structure. No evidence of mitral valve regurgitation. No evidence of mitral stenosis.  4. The aortic valve is tricuspid. There is mild calcification of the aortic valve. There is mild thickening of the aortic valve. Aortic valve regurgitation is mild. Mild aortic valve sclerosis is present, with no evidence of aortic valve stenosis.  5. The inferior vena cava is normal in size with greater than 50% respiratory variability, suggesting right atrial pressure of 3 mmHg. Comparison(s): No significant change from prior study. Prior images reviewed side by side. FINDINGS  Left Ventricle: Left ventricular ejection fraction, by estimation, is 60 to 65%. The left ventricle has normal function. The left ventricle has no regional wall motion abnormalities. The left ventricular internal cavity size was normal in size. There is  moderate concentric left ventricular hypertrophy. Left ventricular diastolic parameters are consistent with Grade I diastolic dysfunction (impaired relaxation). Elevated left atrial pressure. Right Ventricle: The right ventricular size is normal. No increase in right ventricular wall thickness. Right ventricular systolic function is normal. Left Atrium: Left atrial  size was normal in size. Right Atrium: Right atrial size was normal in size. Pericardium: There is no evidence of pericardial effusion. Mitral Valve: The mitral valve is normal in structure. There is mild thickening of the mitral valve leaflet(s). No evidence of mitral valve regurgitation. No evidence of mitral valve stenosis. MV peak gradient, 5.4 mmHg. The mean mitral valve gradient is  1.0 mmHg. Tricuspid Valve: The tricuspid valve is normal in structure. Tricuspid valve regurgitation is not demonstrated. No evidence of tricuspid stenosis. Aortic Valve: The aortic valve is tricuspid. There is mild calcification of the aortic valve. There is mild thickening of the aortic valve. Aortic valve regurgitation is mild. Aortic regurgitation PHT measures 754 msec. Mild aortic valve sclerosis is present, with no evidence of aortic valve stenosis. Aortic valve mean  gradient measures 4.0 mmHg. Aortic valve peak gradient measures 8.1 mmHg. Aortic valve area, by VTI measures 1.57 cm. Pulmonic Valve: The pulmonic valve was normal in structure. Pulmonic valve regurgitation is not visualized. No evidence of pulmonic stenosis. Aorta: The aortic root is normal in size and structure. Venous: The inferior vena cava is normal in size with greater than 50% respiratory variability, suggesting right atrial pressure of 3 mmHg. IAS/Shunts: No atrial level shunt detected by color flow Doppler.  LEFT VENTRICLE PLAX 2D LVIDd:         4.10 cm     Diastology LVIDs:         2.50 cm     LV e' medial:    3.73 cm/s LV PW:         1.50 cm     LV E/e' medial:  18.3 LV IVS:        1.50 cm     LV e' lateral:   4.54 cm/s LVOT diam:     1.80 cm     LV E/e' lateral: 15.0 LV SV:         56 LV SV Index:   30 LVOT Area:     2.54 cm  LV Volumes (MOD) LV vol d, MOD A2C: 33.1 ml LV vol d, MOD A4C: 54.0 ml LV vol s, MOD A2C: 17.1 ml LV vol s, MOD A4C: 14.4 ml LV SV MOD A2C:     16.0 ml LV SV MOD A4C:     54.0 ml LV SV MOD BP:      27.8 ml RIGHT VENTRICLE RV  Basal diam:  3.40 cm     PULMONARY VEINS RV Mid diam:    2.20 cm     A Reversal Duration: 113.00 msec RV S prime:     16.50 cm/s  A Reversal Velocity: 27.70 cm/s TAPSE (M-mode): 2.4 cm      Diastolic Velocity:  89.37 cm/s                             S/D Velocity:        1.30                             Systolic Velocity:   34.28 cm/s LEFT ATRIUM             Index LA diam:        2.90 cm 1.56 cm/m LA Vol (A2C):   53.3 ml 28.59 ml/m LA Vol (A4C):   51.4 ml 27.57 ml/m LA Biplane Vol: 55.2 ml 29.60 ml/m  AORTIC VALVE                   PULMONIC VALVE AV Area (Vmax):    1.58 cm    PV Vmax:       0.72 m/s AV Area (Vmean):   1.64 cm    PV Vmean:      57.200 cm/s AV Area (VTI):     1.57 cm    PV VTI:        0.209 m AV Vmax:           142.00 cm/s PV Peak grad:  2.0 mmHg AV Vmean:          88.100 cm/s PV Mean grad:  1.0 mmHg AV VTI:            0.359 m AV Peak  Grad:      8.1 mmHg AV Mean Grad:      4.0 mmHg LVOT Vmax:         88.40 cm/s LVOT Vmean:        56.700 cm/s LVOT VTI:          0.222 m LVOT/AV VTI ratio: 0.62 AI PHT:            754 msec  AORTA Ao Root diam: 3.50 cm Ao Asc diam:  3.40 cm MITRAL VALVE MV Area (PHT): 3.17 cm    SHUNTS MV Area VTI:   1.46 cm    Systemic VTI:  0.22 m MV Peak grad:  5.4 mmHg    Systemic Diam: 1.80 cm MV Mean grad:  1.0 mmHg MV Vmax:       1.16 m/s MV Vmean:      50.8 cm/s MV Decel Time: 239 msec MV E velocity: 68.10 cm/s MV A velocity: 99.80 cm/s MV E/A ratio:  0.68 Mihai Croitoru MD Electronically signed by Sanda Klein MD Signature Date/Time: 11/04/2020/3:40:47 PM    Final    VAS US CAROTID (at [facility] and [facility] only)  Result Date: 11/05/2020 Carotid Arterial Duplex Study Patient Name:  CLOIS MONTAVON  Date of Exam:   11/05/2020 Medical Rec #: [id number]     Accession #:    [id number] Date of Birth: 21-Feb-1956     Patient Gender: F Patient Age:   65Y Exam Location:  [facility] Procedure:      VAS US CAROTID Referring Phys: [id number] TIMOTHY S OPYD  --------------------------------------------------------------------------------  Indications:       CVA. Risk Factors:      Hypertension, Diabetes, past history of smoking, coronary                    artery disease, prior CVA. Other Factors:     COPD, CKD3. Comparison Study:  Previous exam 06/26/20 - WNL Performing Technologist: Rogelia Rohrer  Examination Guidelines: A complete evaluation includes B-mode imaging, spectral Doppler, color Doppler, and power Doppler as needed of all accessible portions of each vessel. Bilateral testing is considered an integral part of a complete examination. Limited examinations for reoccurring indications may be performed as noted.  Right Carotid Findings: +----------+--------+--------+--------+------------------+------------------+           PSV cm/sEDV cm/sStenosisPlaque DescriptionComments           +----------+--------+--------+--------+------------------+------------------+ CCA Prox  54      12                                intimal thickening +----------+--------+--------+--------+------------------+------------------+ CCA Distal39      14                                intimal thickening +----------+--------+--------+--------+------------------+------------------+ ICA Prox  42      15                                intimal thickening +----------+--------+--------+--------+------------------+------------------+ ICA Distal70      20                                                   +----------+--------+--------+--------+------------------+------------------+  ECA       27      6                                                    +----------+--------+--------+--------+------------------+------------------+ +----------+--------+-------+--------+-------------------+           PSV cm/sEDV cmsDescribeArm Pressure (mmHG) +----------+--------+-------+--------+-------------------+ [id number]                                          +----------+--------+-------+--------+-------------------+ +---------+--------+--+--------+--+---------+ VertebralPSV cm/s40EDV cm/s11Antegrade +---------+--------+--+--------+--+---------+  Left Carotid Findings: +----------+--------+--------+--------+------------------+------------------+           PSV cm/sEDV cm/sStenosisPlaque DescriptionComments           +----------+--------+--------+--------+------------------+------------------+ CCA Prox  58      16                                intimal thickening +----------+--------+--------+--------+------------------+------------------+ CCA Distal50      15                                intimal thickening +----------+--------+--------+--------+------------------+------------------+ ICA Prox  43      16                                intimal thickening +----------+--------+--------+--------+------------------+------------------+ ICA Distal50      19                                                   +----------+--------+--------+--------+------------------+------------------+ ECA       25      6                                                    +----------+--------+--------+--------+------------------+------------------+ +----------+--------+--------+--------+-------------------+           PSV cm/sEDV cm/sDescribeArm Pressure (mmHG) +----------+--------+--------+--------+-------------------+ [id number]                                          +----------+--------+--------+--------+-------------------+ +---------+--------+--+--------+--+---------+ VertebralPSV cm/s36EDV cm/s11Antegrade +---------+--------+--+--------+--+---------+   Summary: Right Carotid: The extracranial vessels were near-normal with only minimal wall                thickening or plaque. Left Carotid: The extracranial vessels were near-normal with only minimal wall               thickening or plaque. Vertebrals: Bilateral vertebral  arteries demonstrate antegrade flow. *See table(s) above for measurements and observations.  Electronically signed by Servando Snare MD on 11/05/2020 at 6:01:44 PM.    Final         The results of significant diagnostics from this hospitalization (including imaging, microbiology, ancillary and laboratory) are listed below for reference.  Microbiology: Recent Results (from the past 240 hour(s))  SARS CORONAVIRUS 2 (TAT 6-24 HRS) Nasopharyngeal Nasopharyngeal Swab     Status: None   Collection Time: 11/04/20 12:57 AM   Specimen: Nasopharyngeal Swab  Result Value Ref Range Status   SARS Coronavirus 2 NEGATIVE NEGATIVE Final    Comment: (NOTE) SARS-CoV-2 target nucleic acids are NOT DETECTED.  The SARS-CoV-2 RNA is generally detectable in upper and lower respiratory specimens during the acute phase of infection. Negative results do not preclude SARS-CoV-2 infection, do not rule out co-infections with other pathogens, and should not be used as the sole basis for treatment or other patient management decisions. Negative results must be combined with clinical observations, patient history, and epidemiological information. The expected result is Negative.  Fact Sheet for Patients: SugarRoll.be  Fact Sheet for Healthcare Providers: https://www.woods-mathews.com/  This test is not yet approved or cleared by the [place] FDA and  has been authorized for detection and/or diagnosis of SARS-CoV-2 by FDA under an Emergency Use Authorization (EUA). This EUA will remain  in effect (meaning this test can be used) for the duration of the COVID-19 declaration under Se ction 564(b)(1) of the Act, 21 U.S.C. section 360bbb-3(b)(1), unless the authorization is terminated or revoked sooner.  Performed at [facility], [place] [address], [place], [place] [address]      Labs:  CBC: Recent Labs  Lab 11/03/20 1632 11/04/20 0418 11/05/20 0304  11/06/20 0230  WBC 5.1 5.0 3.5* 3.3*  NEUTROABS 3.4  --  2.1 2.0  HGB 10.5* 9.2* 9.2* 9.7*  HCT 29.8* 26.2* 25.7* 27.0*  MCV 92.5 93.6 90.8 90.3  PLT 147* PLATELET CLUMPS NOTED ON SMEAR, UNABLE TO ESTIMATE 140* 139*   BMP &GFR Recent Labs  Lab 11/03/20 1632 11/04/20 0418 11/05/20 0304 11/06/20 0230  NA 140 141 140 140  K 4.0 3.5 3.1* 3.9  CL 113* 113* 113* 114*  CO2 21* 21* 20* 21*  GLUCOSE 98 100* 98 124*  BUN _0 CREATININE 1.80* 1.70* 1.63* 1.71*  CALCIUM 9.2 8.8* 8.7* 8.9  MG  --   --  1.7 1.9   Estimated Creatinine Clearance: 33.1 mL/min (A) (by C-G formula based on SCr of 1.71 mg/dL (H)). Liver & Pancreas: Recent Labs  Lab 11/03/20 1632 11/04/20 0418  AST 44* 46*  ALT 33 32  ALKPHOS 45 38  BILITOT 1.3* 1.3*  PROT 6.9 6.9  ALBUMIN 3.4* 3.3*   No results for input(s): LIPASE, AMYLASE in the last 168 hours. No results for input(s): AMMONIA in the last 168 hours. Diabetic: Recent Labs    11/04/20 0418  HGBA1C 6.3*   Recent Labs  Lab 11/05/20 1137 11/05/20 1615 11/05/20 2145 11/06/20 0612 11/06/20 1108  GLUCAP 183* 194* 127* 108* 129*   Cardiac Enzymes: No results for input(s): CKTOTAL, CKMB, CKMBINDEX, TROPONINI in the last 168 hours. No results for input(s): PROBNP in the last 8760 hours. Coagulation Profile: No results for input(s): INR, PROTIME in the last 168 hours. Thyroid Function Tests: No results for input(s): TSH, T4TOTAL, FREET4, T3FREE, THYROIDAB in the last 72 hours. Lipid Profile: Recent Labs    11/04/20 0418  CHOL 131  HDL 58  LDLCALC 56  TRIG 87  CHOLHDL 2.3   Anemia Panel: Recent Labs    11/05/20 0953  VITAMINB12 737  FOLATE 38.9   Urine analysis:    Component Value Date/Time   COLORURINE YELLOW 11/03/2020 1638   APPEARANCEUR CLOUDY (A) 11/03/2020 1638  LABSPEC 1.011 11/03/2020 1638   PHURINE 5.0 11/03/2020 [place] 11/03/2020 1638   [place] 11/03/2020 [place] 11/03/2020 [place] 11/03/2020 1638   PROTEINUR 30 (A) 11/03/2020 1638   NITRITE NEGATIVE 11/03/2020 1638   LEUKOCYTESUR LARGE (A) 11/03/2020 1638   Sepsis Labs: Invalid input(s): PROCALCITONIN, LACTICIDVEN   Time coordinating discharge: 40 minutes  SIGNED:  Mercy Riding, MD  [facility] 11/06/2020, 2:41 PM  If 7PM-7AM, please contact night-coverage [email or web address]

## 2020-11-06 NOTE — Progress Notes (Signed)
Inpatient Rehabilitation Medication Review by a Pharmacist  A complete drug regimen review was completed for this patient to identify any potential clinically significant medication issues.  Clinically significant medication issues were identified:  yes   Type of Medication Issue Identified Description of Issue Urgent (address now) Non-Urgent (address on AM team rounds) Plan Plan Accepted by Provider? (Yes / No / Pending AM Rounds)                                Additional Drug Therapy Needed  hydralazine, MVI Not urgent Messaged PA   Other         Name of provider notified for urgent issues identified: Marlowe Sh[place], PA  Provider Method of Notification: Secure chat   For non-urgent medication issues to be resolved on team rounds tomorrow morning a [facility] Secure Chat Handoff was sent to:    Pharmacist comments:   Time spent performing this drug regimen review (minutes):  10

## 2020-11-07 ENCOUNTER — Other Ambulatory Visit: Payer: Self-pay

## 2020-11-07 DIAGNOSIS — E1169 Type 2 diabetes mellitus with other specified complication: Secondary | ICD-10-CM | POA: Diagnosis not present

## 2020-11-07 DIAGNOSIS — E669 Obesity, unspecified: Secondary | ICD-10-CM | POA: Diagnosis not present

## 2020-11-07 DIAGNOSIS — I6389 Other cerebral infarction: Secondary | ICD-10-CM | POA: Diagnosis not present

## 2020-11-07 DIAGNOSIS — I10 Essential (primary) hypertension: Secondary | ICD-10-CM | POA: Diagnosis not present

## 2020-11-07 LAB — GLUCOSE, CAPILLARY
Glucose-Capillary: 110 mg/dL — ABNORMAL HIGH (ref 70–99)
Glucose-Capillary: 122 mg/dL — ABNORMAL HIGH (ref 70–99)
Glucose-Capillary: 163 mg/dL — ABNORMAL HIGH (ref 70–99)
Glucose-Capillary: 136 mg/dL — ABNORMAL HIGH (ref 70–99)

## 2020-11-07 NOTE — Progress Notes (Signed)
Inpatient Rehabilitation  Patient information reviewed and entered into eRehab system by [facility]. Loni Beckwith., CCC/SLP, PPS Coordinator.  Information including medical coding, functional ability and quality indicators will be reviewed and updated through discharge.

## 2020-11-07 NOTE — Progress Notes (Signed)
PROGRESS NOTE   Subjective/Complaints: Had a headache last night. Tramadol helped. Feeling well this morning  ROS: Patient denies fever, rash, sore throat, blurred vision, nausea, vomiting, diarrhea, cough, shortness of breath or chest pain, joint or back pain,   or mood change.    Objective:   No results found. Recent Labs    11/05/20 0304 11/06/20 0230  WBC 3.5* 3.3*  HGB 9.2* 9.7*  HCT 25.7* 27.0*  PLT 140* 139*   Recent Labs    11/05/20 0304 11/06/20 0230  NA 140 140  K 3.1* 3.9  CL 113* 114*  CO2 20* 21*  GLUCOSE 98 124*  BUN 17 16  CREATININE 1.63* 1.71*  CALCIUM 8.7* 8.9    Intake/Output Summary (Last 24 hours) at 11/07/2020 1249 Last data filed at 11/07/2020 0900 Gross per 24 hour  Intake 420 ml  Output --  Net 420 ml        Physical Exam: Vital Signs Blood pressure (!) 152/74, pulse (!) 55, temperature 97.9 F (36.6 C), temperature source Oral, resp. rate 17, weight 82.3 kg, SpO2 100 %.  General: Alert and oriented x 3, No apparent distress HEENT: Head is normocephalic, atraumatic, PERRLA, EOMI, sclera anicteric, oral mucosa pink and moist, dentition intact, ext ear canals clear,  Neck: Supple without JVD or lymphadenopathy Heart: Reg rate and rhythm. No murmurs rubs or gallops Chest: CTA bilaterally without wheezes, rales, or rhonchi; no distress Abdomen: Soft, non-tender, non-distended, bowel sounds positive. Extremities: No clubbing, cyanosis, or edema. Pulses are 2+ Psych: Pt's affect is appropriate. Pt is cooperative Skin: Clean and intact without signs of breakdown Neuro: Pt is cognitively appropriate with normal insight, memory, and awareness. Cranial nerves 2-12 are intact.  LT diminished LUE. Decreased depth perception and [place] LUE>LLE. RUE and RLE 4/5. LUE and LLE 3+ to 4-/5.   Musculoskeletal: Full ROM, No pain with AROM or PROM in the neck, trunk, or extremities. Posture  appropriate     Assessment/Plan: 1. Functional deficits which require 3+ hours per day of interdisciplinary therapy in a comprehensive inpatient rehab setting.  Physiatrist is providing close team supervision and 24 hour management of active medical problems listed below.  Physiatrist and rehab team continue to assess barriers to discharge/monitor patient progress toward functional and medical goals  Care Tool:  Bathing              Bathing assist       Upper Body Dressing/Undressing Upper body dressing   What is the patient wearing?: Hospital gown only    Upper body assist Assist Level: Set up assist    Lower Body Dressing/Undressing Lower body dressing      What is the patient wearing?: Pants     Lower body assist Assist for lower body dressing: Minimal Assistance - Patient > 75%     Toileting Toileting    Toileting assist Assist for toileting: Minimal Assistance - Patient > 75%     Transfers Chair/bed transfer  Transfers assist     Chair/bed transfer assist level: Minimal Assistance - Patient > 75%     Locomotion Ambulation   Ambulation assist      Assist level: Minimal  Assistance - Patient > 75% Assistive device: Walker-rolling Max distance: 55   Walk 10 feet activity   Assist     Assist level: Minimal Assistance - Patient > 75% Assistive device: Walker-rolling   Walk 50 feet activity   Assist    Assist level: Minimal Assistance - Patient > 75% Assistive device: Walker-rolling    Walk 150 feet activity   Assist Walk 150 feet activity did not occur: Safety/medical concerns         Walk 10 feet on uneven surface  activity   Assist Walk 10 feet on uneven surfaces activity did not occur: Safety/medical concerns         Wheelchair     Assist Will patient use wheelchair at discharge?: No             Wheelchair 50 feet with 2 turns activity    Assist            Wheelchair 150 feet activity      Assist          Blood pressure (!) 152/74, pulse (!) 55, temperature 97.9 F (36.6 C), temperature source Oral, resp. rate 17, weight 82.3 kg, SpO2 100 %.  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 Antithrombotics: -DVT/anticoagulation:Subcutaneous heparin -antiplatelet therapy: Aspirin 81 mg daily and Plavix 75 mg dailyx 3 weeks then aspirin alone 3. Pain Management:Voltaren gel as needed Neurontin 300 mg twice daily as needed  -added tramadol 65m q8 prn for severe headache 4. Mood:Effexor 75 mg daily, BuSpar 10 mg twice daily, trazodone 50 mg nightly -antipsychotic agents: N/A 5. Neuropsych: This patientiscapable of making decisions on herown behalf. 6. Skin/Wound Care:Routine skin checks 7. Fluids/Electrolytes/Nutrition:Routine in and outs with follow-up chemistries 8. Hypertension. Cozaar 50 mg daily, Norvasc 5 mg daily -borderline control at present--observe for pattern 9. Idiopathic pulmonary fibrosis/COPD.Pirfenidone801 mg 3 times daily, continue inhalers  -lungs clear, symptoms controlled at present 10. Diabetes mellitus. Lantus insulin 25 units twice daily. Check blood sugars before meals and at bedtime  CBG (last 3)  Recent Labs    11/06/20 2133 11/07/20 0633 11/07/20 1122  GLUCAP 179* 110* 122*    5/28 fair control. Follow for pattern 11. CKD stage III. Follow-up chemistries 12. History of rectal cancer. Follow-up outpatient 13. Hyperlipidemia. Crestor 14. GERD. Pepcid    LOS: 1 days A FACE TO FACE EVALUATION WAS PERFORMED  ZMeredith Staggers5/28/2022, 12:49 PM

## 2020-11-07 NOTE — Plan of Care (Signed)
  Problem: RH Balance Goal: LTG Patient will maintain dynamic sitting balance (PT) Description: LTG:  Patient will maintain dynamic sitting balance with assistance during mobility activities (PT) Flowsheets (Taken 11/07/2020 1204) LTG: Pt will maintain dynamic sitting balance during mobility activities with:: Independent with assistive device  Goal: LTG Patient will maintain dynamic standing balance (PT) Description: LTG:  Patient will maintain dynamic standing balance with assistance during mobility activities (PT) Flowsheets (Taken 11/07/2020 1204) LTG: Pt will maintain dynamic standing balance during mobility activities with:: Supervision/Verbal cueing   Problem: Sit to Stand Goal: LTG:  Patient will perform sit to stand with assistance level (PT) Description: LTG:  Patient will perform sit to stand with assistance level (PT) Flowsheets (Taken 11/07/2020 1204) LTG: PT will perform sit to stand in preparation for functional mobility with assistance level: Supervision/Verbal cueing   Problem: RH Bed Mobility Goal: LTG Patient will perform bed mobility with assist (PT) Description: LTG: Patient will perform bed mobility with assistance, with/without cues (PT). Flowsheets (Taken 11/07/2020 1204) LTG: Pt will perform bed mobility with assistance level of: Independent with assistive device    Problem: RH Bed to Chair Transfers Goal: LTG Patient will perform bed/chair transfers w/assist (PT) Description: LTG: Patient will perform bed to chair transfers with assistance (PT). Flowsheets (Taken 11/07/2020 1204) LTG: Pt will perform Bed to Chair Transfers with assistance level: Supervision/Verbal cueing   Problem: RH Car Transfers Goal: LTG Patient will perform car transfers with assist (PT) Description: LTG: Patient will perform car transfers with assistance (PT). Flowsheets (Taken 11/07/2020 1204) LTG: Pt will perform car transfers with assist:: Supervision/Verbal cueing   Problem: RH  Ambulation Goal: LTG Patient will ambulate in controlled environment (PT) Description: LTG: Patient will ambulate in a controlled environment, # of feet with assistance (PT). Flowsheets (Taken 11/07/2020 1204) LTG: Pt will ambulate in controlled environ  assist needed:: Supervision/Verbal cueing LTG: Ambulation distance in controlled environment: 150 Goal: LTG Patient will ambulate in home environment (PT) Description: LTG: Patient will ambulate in home environment, # of feet with assistance (PT). Flowsheets (Taken 11/07/2020 1204) LTG: Pt will ambulate in home environ  assist needed:: Supervision/Verbal cueing LTG: Ambulation distance in home environment: 50

## 2020-11-07 NOTE — Evaluation (Signed)
Occupational Therapy Assessment and Plan  Patient Details  Name: Erica [name] MRN: [id number] Date of Birth: 1956-01-31  OT Diagnosis: abnormal posture, cognitive deficits, disturbance of vision, hemiplegia affecting non-dominant side and muscle weakness (generalized) Rehab Potential: Rehab Potential (ACUTE ONLY): Excellent ELOS: 7-9 days   Today's Date: 11/07/2020 OT Individual Time: 2831-5176 OT Individual Time Calculation (min): 73 min     Hospital Problem: Principal Problem:   Parietal lobe infarction [facility])   Past Medical History:  Past Medical History:  Diagnosis Date  . Arthritis    especially in shoulders  . Asthma   . Depression   . Diabetes mellitus   . GERD (gastroesophageal reflux disease)   . Headache(784.0)    "mild"  . Hypertension   . Mental disorder    depression  . Neuropathy    feet   . Pain    arthritis pain - takes tramadol as needed  . Rectal polyp    very little bleeding with bowel movements- no pain  . Suicide attempt [facility])    Past Surgical History:  Past Surgical History:  Procedure Laterality Date  . ANAL RECTAL MANOMETRY N/A 07/13/2016   Procedure: ANO RECTAL MANOMETRY;  Surgeon: Leighton Ruff, MD;  Location: [facility] ENDOSCOPY;  Service: Endoscopy;  Laterality: N/A;  . CESAREAN SECTION    . EUS N/A 11/21/2012   Procedure: LOWER ENDOSCOPIC ULTRASOUND (EUS);  Surgeon: Arta Silence, MD;  Location: [facility];  Service: Endoscopy;  Laterality: N/A;  . FLEXIBLE SIGMOIDOSCOPY N/A 11/21/2012   Procedure: FLEXIBLE SIGMOIDOSCOPY;  Surgeon: Arta Silence, MD;  Location: [facility] ENDOSCOPY;  Service: Endoscopy;  Laterality: N/A;  . FLEXIBLE SIGMOIDOSCOPY N/A 01/28/2013   Procedure: FLEXIBLE SIGMOIDOSCOPY;  Surgeon: Leighton Ruff, MD;  Location: [facility] ENDOSCOPY;  Service: Endoscopy;  Laterality: N/A;  . LAPAROSCOPIC LOW ANTERIOR RESECTION N/A 01/29/2013   Procedure: LAPAROSCOPIC LOW ANTERIOR RESECTION, Rigid Proctoscopy;  Surgeon: Leighton Ruff, MD;  Location: [facility]  ORS;  Service: General;  Laterality: N/A;  . LAPAROSCOPIC SIGMOID COLECTOMY N/A 11/14/2012   Procedure: DIAGNOSTIC LAPAROSCOPY AND SIGMOIDMOIDOSCOPY ;  Surgeon: Rolm Bookbinder, MD;  Location: [facility] ORS;  Service: General;  Laterality: N/A;  . RECTAL ULTRASOUND N/A 07/13/2016   Procedure: RECTAL ULTRASOUND;  Surgeon: Leighton Ruff, MD;  Location: [facility] ENDOSCOPY;  Service: Endoscopy;  Laterality: N/A;  . TONSILLECTOMY    . TONSILLECTOMY AND ADENOIDECTOMY      Assessment & Plan Clinical Impression: Patient is a 65 y.o. year old female with recent admission to the hospital on 11/03/2020 with persistent headache and left-sided weakness x3 days. MRI showed punctate acute infarction at the medial right parieto-occipital junction  Patient transferred to CIR on 11/06/2020 .    Patient currently requires min with basic self-care skills secondary to muscle weakness, impaired timing and sequencing, unbalanced muscle activation and decreased coordination, decreased visual motor skills, decreased memory and decreased standing balance, decreased postural control, hemiplegia and decreased balance strategies.  Prior to hospitalization, patient could complete ADLs with modified independent .  Patient will benefit from skilled intervention to decrease level of assist with basic self-care skills and increase independence with basic self-care skills prior to discharge home with care partner.  Anticipate patient will require intermittent supervision and follow up home health.  OT - End of Session Activity Tolerance: Tolerates 30+ min activity with multiple rests Endurance Deficit: Yes OT Assessment Rehab Potential (ACUTE ONLY): Excellent OT Patient demonstrates impairments in the following area(s): Balance;Motor;Sensory;Cognition;Vision OT Basic ADL's Functional Problem(s): Grooming;Bathing;Dressing;Toileting OT Advanced ADL's Functional Problem(s): Simple  Meal Preparation OT Transfers Functional Problem(s):  Toilet;Tub/Shower OT Additional Impairment(s): Fuctional Use of Upper Extremity OT Plan OT Intensity: Minimum of 1-2 x/day, 45 to 90 minutes OT Frequency: 5 out of 7 days OT Duration/Estimated Length of Stay: 7-9 days OT Treatment/Interventions: Balance/vestibular training;Cognitive remediation/compensation;Community reintegration;DME/adaptive equipment instruction;Discharge planning;Disease mangement/prevention;Functional mobility training;Neuromuscular re-education;Patient/family education;Self Care/advanced ADL retraining;Therapeutic Activities;UE/LE Coordination activities;Visual/perceptual remediation/compensation;UE/LE Strength taining/ROM;Psychosocial support;Therapeutic Exercise OT Self Feeding Anticipated Outcome(s): independent OT Basic Self-Care Anticipated Outcome(s): modified independent OT Toileting Anticipated Outcome(s): modified independent OT Bathroom Transfers Anticipated Outcome(s): supervision OT Recommendation Patient destination: Home Follow Up Recommendations: Home health OT Equipment Recommended: None recommended by OT   OT Evaluation Precautions/Restrictions  Precautions Precautions: Fall Precaution Comments: mild L hemi Restrictions Weight Bearing Restrictions: No  Pain Pain Assessment Pain Score: 0-No pain Home Living/Prior Functioning Home Living Available Help at Discharge: Family,Available PRN/intermittently Type of Home: Apartment Home Access: Level entry Home Layout: One level Bathroom Shower/Tub: Tub/shower unit,Curtain Biochemist, clinical: Handicapped height Bathroom Accessibility: Yes Additional Comments: Owns a shower chair and uses a hand held shower  Lives With: Son IADL History Homemaking Responsibilities: Yes Meal Prep Responsibility: Therapist, occupational Responsibility: Primary Cleaning Responsibility: Primary Current License: No Occupation: Retired Prior Function Level of Independence: Requires assistive device for  independence,Independent with basic ADLs  Able to Take Stairs?: Yes Driving: No Vocation: Retired Comments: Ind wirh ADLs/selfcare, Uses RW (sometimes SPC) for ambulation Vision Baseline Vision/History: Wears glasses Wears Glasses: At all times (does not have them with her) Patient Visual Report: No change from baseline Vision Assessment?: Yes Eye Alignment: Within Functional Limits Ocular Range of Motion: Within Functional Limits Alignment/Gaze Preference: Within Defined Limits Tracking/Visual Pursuits: Decreased smoothness of horizontal tracking;Decreased smoothness of vertical tracking;Other (comment) (Pt with decreased ability to maintain visual fixation on target with tracking in all directions, overshooting ahead of target at times.  Some horizontal nystagmus noted as well greater than 3 beats.) Saccades: Undershoots;Overshoots Convergence: Impaired (comment) (left eye exotropic) Visual Fields: No apparent deficits Perception  Perception: Within Functional Limits Praxis Praxis: Intact Cognition Overall Cognitive Status: Impaired/Different from baseline Arousal/Alertness: Awake/alert Orientation Level: Person;Place;Situation Person: Oriented Place: Oriented Situation: Oriented Year: 2022 Month: May Day of Week: Correct Memory: Impaired Memory Impairment: Storage deficit;Retrieval deficit Immediate Memory Recall: Sock;Blue;Bed Memory Recall Sock: Without Cue Memory Recall Blue: Not able to recall Memory Recall Bed: Not able to recall Attention: Sustained;Selective Sustained Attention: Appears intact Selective Attention: Impaired Selective Attention Impairment: Functional basic;Functional complex Awareness: Impaired Awareness Impairment: Anticipatory impairment Problem Solving: Impaired Problem Solving Impairment: Functional basic Initiating: Impaired Safety/Judgment: Impaired Sensation Sensation Light Touch: Impaired Detail Light Touch Impaired Details: Impaired  LUE Hot/Cold: Appears Intact Proprioception: Appears Intact Stereognosis: Appears Intact Additional Comments: Pt able to detect light touch in the LUE throughout but reports numbness compared to the left, especially in the distal fingers. Coordination Gross Motor Movements are Fluid and Coordinated: No Fine Motor Movements are Fluid and Coordinated: No Coordination and Movement Description: Slightly slower finger to nose on the left side, but uses functionally at a diminshed level for selfcare tasks and grooming. Motor  Motor Motor: Hemiplegia Motor - Skilled Clinical Observations: L hemi LE> UE  Trunk/Postural Assessment  Cervical Assessment Cervical Assessment: Within Functional Limits Thoracic Assessment Thoracic Assessment: Exceptions to [facility] (thoracic rounding) Lumbar Assessment Lumbar Assessment: Exceptions to [facility] (posterior pelvic tilt)  Balance Balance Balance Assessed: Yes Static Sitting Balance Static Sitting - Balance Support: Feet supported Static Sitting - Level of Assistance: 5: Stand by assistance Dynamic Sitting Balance Dynamic  Sitting - Balance Support: Feet supported Dynamic Sitting - Level of Assistance: Other (comment) (min guard) Static Standing Balance Static Standing - Balance Support: During functional activity;Bilateral upper extremity supported Static Standing - Level of Assistance: 4: Min assist Dynamic Standing Balance Dynamic Standing - Balance Support: During functional activity;Bilateral upper extremity supported;No upper extremity supported Dynamic Standing - Level of Assistance: 4: Min assist Extremity/Trunk Assessment RUE Assessment RUE Assessment: Within Functional Limits LUE Assessment LUE Assessment: Exceptions to [facility] Passive Range of Motion (PROM) Comments: WFLS Active Range of Motion (AROM) Comments: WFLs General Strength Comments: 3+5 throughout with decreased coordination noted with finger to nose testing compared to the left. Uses  functionally at a diminished level for selfcare tasks.  Care Tool Care Tool Self Care Eating   Eating Assist Level: Independent    Oral Care    Oral Care Assist Level: Contact Guard/Toucning assist (standing at the sink)    Bathing   Body parts bathed by patient: Right arm;Left arm;Chest;Abdomen;Front perineal area;Buttocks;Right upper leg;Left upper leg;Right lower leg;Left lower leg;Face     Assist Level: Minimal Assistance - Patient > 75%    Upper Body Dressing(including orthotics)   What is the patient wearing?: Pull over shirt   Assist Level: Set up assist    Lower Body Dressing (excluding footwear)   What is the patient wearing?: Pants;Incontinence brief Assist for lower body dressing: Minimal Assistance - Patient > 75%    Putting on/Taking off footwear   What is the patient wearing?: Non-skid slipper socks;Ted hose Assist for footwear: Moderate Assistance - Patient 50 - 74%       Care Tool Toileting Toileting activity   Assist for toileting: Minimal Assistance - Patient > 75%     Care Tool Bed Mobility Roll left and right activity        Sit to lying activity   Sit to lying assist level: Supervision/Verbal cueing    Lying to sitting edge of bed activity   Lying to sitting edge of bed assist level: Supervision/Verbal cueing     Care Tool Transfers Sit to stand transfer   Sit to stand assist level: Minimal Assistance - Patient > 75%    Chair/bed transfer   Chair/bed transfer assist level: Minimal Assistance - Patient > 75%     Toilet transfer   Assist Level: Minimal Assistance - Patient > 75%     Care Tool Cognition Expression of Ideas and Wants Expression of Ideas and Wants: Without difficulty (complex and basic) - expresses complex messages without difficulty and with speech that is clear and easy to understand   Understanding Verbal and Non-Verbal Content Understanding Verbal and Non-Verbal Content: Usually understands - understands most  conversations, but misses some part/intent of message. Requires cues at times to understand   Memory/Recall Ability *first 3 days only Memory/Recall Ability *first 3 days only: Current season;That he or she is in a hospital/hospital unit    Refer to Care Plan for Long Term Goals  SHORT TERM GOAL WEEK 1 OT Short Term Goal 1 (Week 1): STGs equal to LTGs set at supervision to modified independent level.  Recommendations for other services: None    Skilled Therapeutic Intervention ADL ADL Eating: Independent Where Assessed-Eating: Wheelchair Grooming: Contact guard (standing at the sink) Where Assessed-Grooming: Standing at sink Upper Body Bathing: Supervision/safety Where Assessed-Upper Body Bathing: Edge of bed Lower Body Bathing: Minimal assistance Where Assessed-Lower Body Bathing: Edge of bed Upper Body Dressing: Supervision/safety Where Assessed-Upper Body Dressing: Edge of bed Lower  Body Dressing: Minimal assistance Where Assessed-Lower Body Dressing: Edge of bed Toileting: Minimal assistance Where Assessed-Toileting: Glass blower/designer: Psychiatric nurse Method: Counselling psychologist: Energy manager: Not assessed Social research officer, government: Not assessed Mobility  Bed Mobility Bed Mobility: Supine to Sit;Sit to Supine Supine to Sit: Supervision/Verbal cueing Sit to Supine: Supervision/Verbal cueing Transfers Sit to Stand: Minimal Assistance - Patient > 75% Stand to Sit: Minimal Assistance - Patient > 75%  Session Note:  Pt completed bathing and dressing EOB per her request.  She was able to complete all UB selfcare in sitting and LB selfcare sit to stand with min assist.  Min assist was also needed for toilet transfer to the bathroom without an assistive device.  Decreased step length noted with mobility, especially when advancing the RLE, secondary to decreased ability to support her weight with the LLE.  Min guard for  standing at the sink to complete oral hygiene.  She continues to report increased numbness in her LUE with greater distal numbness in the digits compared to the arm.  She was left in the bed at end of session with call button and phone in reach.  She is in agreement with estimated LOS at around one week with supervision to modified independent goals.    Discharge Criteria: Patient will be discharged from OT if patient refuses treatment 3 consecutive times without medical reason, if treatment goals not met, if there is a change in medical status, if patient makes no progress towards goals or if patient is discharged from hospital.  The above assessment, treatment plan, treatment alternatives and goals were discussed and mutually agreed upon: by patient  Monti Villers OTR/L 11/07/2020, 4:53 PM

## 2020-11-07 NOTE — Evaluation (Signed)
Physical Therapy Assessment and Plan  Patient Details  Name: Erica [name] MRN: [id number] Date of Birth: September 14, 1955  PT Diagnosis: Abnormal posture, Abnormality of gait, Cognitive deficits, Difficulty walking, Hemiparesis non-dominant, Impaired sensation and Muscle weakness Rehab Potential: Good ELOS: 7-10 days   Today's Date: 11/07/2020 PT Individual Time: 4196-2229 PT Individual Time Calculation (min): 58 min    Hospital Problem: Principal Problem:   Parietal lobe infarction [facility])   Past Medical History:  Past Medical History:  Diagnosis Date  . Arthritis    especially in shoulders  . Asthma   . Depression   . Diabetes mellitus   . GERD (gastroesophageal reflux disease)   . Headache(784.0)    "mild"  . Hypertension   . Mental disorder    depression  . Neuropathy    feet   . Pain    arthritis pain - takes tramadol as needed  . Rectal polyp    very little bleeding with bowel movements- no pain  . Suicide attempt [facility])    Past Surgical History:  Past Surgical History:  Procedure Laterality Date  . ANAL RECTAL MANOMETRY N/A 07/13/2016   Procedure: ANO RECTAL MANOMETRY;  Surgeon: Leighton Ruff, MD;  [facility] END[facility];  Service: Endoscopy;  Laterality: N/A;  . CESAREAN SECTION    . EUS N/A 11/21/2012   Procedure: LOWER ENDOSCOPIC ULTRASOUND (EUS);  Surgeon: Arta Silence, MD;  Location: [facility];  Service: Endoscopy;  Laterality: N/A;  . FLEXIBLE SIGMOIDOSCOPY N/A 11/21/2012   Procedure: FLEXIBLE SIGMOIDOSCOPY;  Surgeon: Arta Silence, MD;  [facility];  Service: Endoscopy;  Laterality: N/A;  . FLEXIBLE SIGMOIDOSCOPY N/A 01/28/2013   Procedure: FLEXIBLE SIGMOIDOSCOPY;  Surgeon: Leighton Ruff, MD;  [facility];  Service: Endoscopy;  Laterality: N/A;  . LAPAROSCOPIC LOW ANTERIOR RESECTION N/A 01/29/2013   Procedure: LAPAROSCOPIC LOW ANTERIOR RESECTION, Rigid Proctoscopy;  Surgeon: Leighton Ruff, MD;  [facility] ORS;  Service: General;   Laterality: N/A;  . LAPAROSCOPIC SIGMOID COLECTOMY N/A 11/14/2012   Procedure: DIAGNOSTIC LAPAROSCOPY AND SIGMOIDMOIDOSCOPY ;  Surgeon: Rolm Bookbinder, MD;  [facility]S;  Service: General;  Laterality: N/A;  . RECTAL ULTRASOUND N/A 07/13/2016   Procedure: RECTAL ULTRASOUND;  Surgeon: Leighton Ruff, MD;  [facility];  Service: Endoscopy;  Laterality: N/A;  . TONSILLECTOMY    . TONSILLECTOMY AND ADENOIDECTOMY      Assessment & Plan Clinical Impression: Patient is a 65 y.o. 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 Per chart review lives with her children. Independent with assistive device. 1 level home with level entry. Good supportive family. Presented 11/03/2020 with persistent headache and left-sided weakness x3 days. MRI showed punctate acute infarction at the medial right parieto-occipital junction. Chronic small vessel ischemic changes. MRA of the head negative for large vessel occlusion. MRI cervical spine with persistent patchy signal abnormality involving the central dorsal aspect of the cervical spinal cord extending from C2-C7-T1 relatively stable and unchanged from previous tracings. Admission chemistries unremarkable except chloride 113 creatinine 1.70 hemoglobin 9.2 hemoglobin A1c 6.3. Echocardiogram with ejection fraction of 60 to 79% grade 1 diastolic dysfunction. Currently maintained on aspirin and Plavix for CVA prophylaxis x3 weeks and aspirin alone. Subcutaneous heparin for DVT prophylaxis. Awaiting plan for loop recorder. Therapy evaluations completed due to patient's left-sided weakness was admitted for a comprehensive rehab program. Patient currently requires min with  mobility secondary to muscle weakness, decreased cardiorespiratoy endurance, decreased motor  planning, decreased initiation, decreased attention, decreased awareness, decreased problem solving, decreased safety awareness, decreased memory and delayed processing and decreased sitting balance, decreased standing balance, decreased postural control, hemiplegia and decreased balance strategies.  Prior to hospitalization, patient was modified independent  with mobility and lived with Son in a [place] home.  Home access is  Level entry.  Patient will benefit from skilled PT intervention to maximize safe functional mobility, minimize fall risk and decrease caregiver burden for planned discharge home with 24 hour supervision.  Anticipate patient will benefit from follow up [place] at discharge.  PT - End of Session Activity Tolerance: Tolerates 30+ min activity with multiple rests Endurance Deficit: Yes PT Assessment Rehab Potential (ACUTE/IP ONLY): Good PT Barriers to Discharge: Decreased caregiver support PT Barriers to Discharge Comments: h/o multiple CVAs PT Patient demonstrates impairments in the following area(s): Balance;Behavior;Endurance;Motor;Nutrition;Pain;Perception;Safety;Sensory;Skin Integrity PT Transfers Functional Problem(s): Bed Mobility;Bed to Chair;Car;Furniture PT Locomotion Functional Problem(s): Ambulation;Wheelchair Mobility;Stairs PT Plan PT Intensity: Minimum of 1-2 x/day ,45 to 90 minutes PT Frequency: 5 out of 7 days PT Duration Estimated Length of Stay: 7-10 days PT Treatment/Interventions: Ambulation/gait training;DME/adaptive equipment instruction;Psychosocial support;Functional electrical stimulation;UE/LE Strength taining/ROM;Skin care/wound management;Balance/vestibular training;UE/LE Coordination activities;Cognitive remediation/compensation;Functional mobility training;Splinting/orthotics;Visual/perceptual remediation/compensation;Community reintegration;Neuromuscular re-education;Stair training;Wheelchair propulsion/positioning;Discharge planning;Pain  management;Therapeutic Activities;Disease management/prevention;Patient/family education;Therapeutic Exercise PT Transfers Anticipated Outcome(s): spv PT Locomotion Anticipated Outcome(s): spv PT Recommendation Recommendations for Other Services: Therapeutic Recreation consult Therapeutic Recreation Interventions: Stress management Follow Up Recommendations: Home health PT;24 hour supervision/assistance Patient destination: Home Equipment Recommended: To be determined Equipment Details: owns RW and [facility]   PT Evaluation Precautions/Restrictions Precautions Precautions: Fall Precaution Comments: L hemi Restrictions Weight Bearing Restrictions: No Home Living/Prior Functioning Home Living Available Help at Discharge: Family;Available PRN/intermittently (patient reports he's there "most of the time") Type of Home: Apartment Home Access: Level entry Home Layout: One level Bathroom Shower/Tub: Tub/shower unit;Curtain Bathroom Toilet: Handicapped height Bathroom Accessibility: Yes  Lives With: Son Prior Function Level of Independence: Requires assistive device for independence;Independent with homemaking with ambulation;Independent with basic ADLs;Independent with transfers  Able to Take Stairs?: No Driving: No Comments: Ind wirh ADLs/selfcare, Uses RW (sometimes SPC) for ambulation Vision/Perception  Vision - Assessment Additional Comments: patient reports that son cannot bring glasses in for her at this time Perception Perception: Within Functional Limits Praxis Praxis: Intact  Cognition Overall Cognitive Status: Impaired/Different from baseline Arousal/Alertness: Awake/alert Orientation Level: Oriented X4 Selective Attention: Impaired Memory: Impaired Awareness: Impaired Problem Solving: Impaired Initiating: Impaired Safety/Judgment: Impaired Sensation Sensation Light Touch: Impaired by gross assessment Light Touch Impaired Details: Impaired LUE;Impaired  LLE Hot/Cold: Not tested Proprioception: Appears Intact Stereognosis: Appears Intact Coordination Gross Motor Movements are Fluid and Coordinated: No Fine Motor Movements are Fluid and Coordinated: No Coordination and Movement Description: L hemi Heel Shin Test: decreased ROM and fluidity L LE Motor  Motor Motor: Hemiplegia;Abnormal tone Motor - Skilled Clinical Observations: L hemi LE> UE   Trunk/Postural Assessment  Cervical Assessment Cervical Assessment: Within Functional Limits Thoracic Assessment Thoracic Assessment: Exceptions to [facility] (increased kyphosis) Lumbar Assessment Lumbar Assessment: Exceptions to [facility] (posterior pelvic tilt) Postural Control Postural Control: Deficits on evaluation Righting Reactions: delayed and inadequate Protective Responses: delayed and inadequate  Balance Balance Balance Assessed: Yes Standardized Balance Assessment Standardized Balance Assessment: Timed Up and Go Test Timed Up and Go Test TUG: Normal TUG Normal TUG (seconds): 100.95 Static Sitting Balance Static Sitting - Balance Support: Feet supported Static Sitting - Level of Assistance: 5: Stand  by assistance Dynamic Sitting Balance Dynamic Sitting - Balance Support: Feet supported Dynamic Sitting - Level of Assistance: 5: Stand by assistance Static Standing Balance Static Standing - Balance Support: During functional activity;Bilateral upper extremity supported Static Standing - Level of Assistance: 4: Min assist Dynamic Standing Balance Dynamic Standing - Balance Support: During functional activity;Bilateral upper extremity supported Dynamic Standing - Level of Assistance: 4: Min assist Extremity Assessment      RLE Assessment RLE Assessment: Exceptions to [facility] RLE Strength Right Hip Flexion: 4/5 Right Hip ABduction: 4/5 Right Hip ADduction: 4/5 Right Knee Flexion: 4/5 Right Knee Extension: 4/5 Right Ankle Dorsiflexion: 4/5 Right Ankle Plantar Flexion: 4/5 LLE  Assessment LLE Assessment: Exceptions to [facility] LLE Strength Left Hip Flexion: 3/5 Left Hip ABduction: 4-/5 Left Hip ADduction: 4-/5 Left Knee Flexion: 4-/5 Left Knee Extension: 3+/5 Left Ankle Dorsiflexion: 4-/5 Left Ankle Plantar Flexion: 4-/5  Care Tool Care Tool Bed Mobility Roll left and right activity   Roll left and right assist level: Supervision/Verbal cueing    Sit to lying activity   Sit to lying assist level: Supervision/Verbal cueing    Lying to sitting edge of bed activity   Lying to sitting edge of bed assist level: Supervision/Verbal cueing     Care Tool Transfers Sit to stand transfer   Sit to stand assist level: Minimal Assistance - Patient > 75%    Chair/bed transfer   Chair/bed transfer assist level: Minimal Assistance - Patient > 75%     Psychologist, counselling transfer activity did not occur: Safety/medical concerns        Care Tool Locomotion Ambulation   Assist level: Minimal Assistance - Patient > 75% Assistive device: Walker-rolling Max distance: 55  Walk 10 feet activity   Assist level: Minimal Assistance - Patient > 75% Assistive device: Walker-rolling   Walk 50 feet with 2 turns activity   Assist level: Minimal Assistance - Patient > 75% Assistive device: Walker-rolling  Walk 150 feet activity Walk 150 feet activity did not occur: Safety/medical concerns      Walk 10 feet on uneven surfaces activity Walk 10 feet on uneven surfaces activity did not occur: Safety/medical concerns      Stairs Stair activity did not occur: Safety/medical concerns        Walk up/down 1 step activity Walk up/down 1 step or curb (drop down) activity did not occur: Safety/medical concerns     Walk up/down 4 steps activity did not occuR: Safety/medical concerns  Walk up/down 4 steps activity      Walk up/down 12 steps activity Walk up/down 12 steps activity did not occur: Safety/medical concerns      Pick up small objects from floor  Pick up small object from the floor (from standing position) activity did not occur: Safety/medical concerns      Wheelchair Will patient use wheelchair at discharge?: No          Wheel 50 feet with 2 turns activity      Wheel 150 feet activity        Refer to Care Plan for Long Term Goals  SHORT TERM GOAL WEEK 1 PT Short Term Goal 1 (Week 1): STG = LTG based on ELOS  Recommendations for other services: Therapeutic Recreation  Stress management  Skilled Therapeutic Intervention Mobility Bed Mobility Bed Mobility: Rolling Right;Rolling Left;Supine to Sit;Sit to Supine Rolling Right: Supervision/verbal cueing Rolling Left: Supervision/Verbal cueing Supine to Sit: Supervision/Verbal cueing Sit  to Supine: Supervision/Verbal cueing Transfers Transfers: Sit to Stand;Stand to Sit;Stand Pivot Transfers Sit to Stand: Minimal Assistance - Patient > 75% Stand to Sit: Minimal Assistance - Patient > 75% Stand Pivot Transfers: Minimal Assistance - Patient > 75% Stand Pivot Transfer Details: Verbal cues for technique Transfer (Assistive device): 1 person hand held assist Locomotion  Gait Ambulation: Yes Gait Assistance: Minimal Assistance - Patient > 75% Gait Distance (Feet): 55 Feet Assistive device: Rolling walker Gait Assistance Details: Verbal cues for safe use of DME/AE;Verbal cues for gait pattern;Verbal cues for precautions/safety Gait Gait: Yes Gait Pattern: Impaired Gait Pattern: Step-to pattern;Decreased step length - left;Narrow base of support;Trunk flexed;Poor foot clearance - left;Decreased hip/knee flexion - left Gait velocity: decreased Stairs / Additional Locomotion Stairs: No Wheelchair Mobility Wheelchair Mobility: No  Patient received supine in bed, agreeable to PT eval. She reports 5/10 pain, headache. RN alerted and will provide rx as appropriate. PT providing rest breaks, distractions and repositioning to assist with pain management. She reports dulled  sensation to L hemi body, but was unable to report to PT if this was due to previous stroke or new with this stroke. Patient requires grossly CGA/MinA to complete all functional mobility. She has delayed initiation and general slowed mobility throughout. With gait, patient reporting feeling dizzy. With sitting, BP 147/78 (66). After ambulating 81f BP 155/81 (104). Patient completed the TUG in 1'40" with RW and MinA indicating increased risk for falling. PT discussed findings with patient and she verbalized understanding. At end of eval, patient requesting to return to bed. Bed alarm on, call light within reach.   Discharge Criteria: Patient will be discharged from PT if patient refuses treatment 3 consecutive times without medical reason, if treatment goals not met, if there is a change in medical status, if patient makes no progress towards goals or if patient is discharged from hospital.  The above assessment, treatment plan, treatment alternatives and goals were discussed and mutually agreed upon: by patient  JDebbora Dus5/28/2022, 10:48 AM

## 2020-11-08 LAB — GLUCOSE, CAPILLARY
Glucose-Capillary: 116 mg/dL — ABNORMAL HIGH (ref 70–99)
Glucose-Capillary: 191 mg/dL — ABNORMAL HIGH (ref 70–99)
Glucose-Capillary: 152 mg/dL — ABNORMAL HIGH (ref 70–99)
Glucose-Capillary: 119 mg/dL — ABNORMAL HIGH (ref 70–99)

## 2020-11-08 NOTE — Evaluation (Signed)
Speech Language Pathology Assessment and Plan  Patient Details  Name: Erica [name] MRN: [id number] Date of Birth: 1956-05-11  SLP Diagnosis: Cognitive Impairments  Rehab Potential: Good ELOS: 7-10 days   Today's Date: 11/08/2020 SLP Individual Time: 1005-1100 SLP Individual Time Calculation (min): 69 min  Hospital Problem: Principal Problem:   Parietal lobe infarction [facility])  Past Medical History:  Past Medical History:  Diagnosis Date  . Arthritis    especially in shoulders  . Asthma   . Depression   . Diabetes mellitus   . GERD (gastroesophageal reflux disease)   . Headache(784.0)    "mild"  . Hypertension   . Mental disorder    depression  . Neuropathy    feet   . Pain    arthritis pain - takes tramadol as needed  . Rectal polyp    very little bleeding with bowel movements- no pain  . Suicide attempt [facility])    Past Surgical History:  Past Surgical History:  Procedure Laterality Date  . ANAL RECTAL MANOMETRY N/A 07/13/2016   Procedure: ANO RECTAL MANOMETRY;  Surgeon: Leighton Ruff, MD;  [facility];  Service: Endoscopy;  Laterality: N/A;  . CESAREAN SECTION    . EUS N/A 11/21/2012   Procedure: LOWER ENDOSCOPIC ULTRASOUND (EUS);  Surgeon: Arta Silence, MD;  [facility];  Service: Endoscopy;  Laterality: N/A;  . FLEXIBLE SIGMOIDOSCOPY N/A 11/21/2012   Procedure: FLEXIBLE SIGMOIDOSCOPY;  Surgeon: Arta Silence, MD;  [facility];  Service: Endoscopy;  Laterality: N/A;  . FLEXIBLE SIGMOIDOSCOPY N/A 01/28/2013   Procedure: FLEXIBLE SIGMOIDOSCOPY;  Surgeon: Leighton Ruff, MD;  [facility];  Service: Endoscopy;  Laterality: N/A;  . LAPAROSCOPIC LOW ANTERIOR RESECTION N/A 01/29/2013   Procedure: LAPAROSCOPIC LOW ANTERIOR RESECTION, Rigid Proctoscopy;  Surgeon: Leighton Ruff, MD;  [facility];  Service: General;  Laterality: N/A;  . LAPAROSCOPIC SIGMOID COLECTOMY N/A 11/14/2012   Procedure: DIAGNOSTIC LAPAROSCOPY AND  SIGMOIDMOIDOSCOPY ;  Surgeon: Rolm Bookbinder, MD;  [facility];  Service: General;  Laterality: N/A;  . RECTAL ULTRASOUND N/A 07/13/2016   Procedure: RECTAL ULTRASOUND;  Surgeon: Leighton Ruff, MD;  [facility];  Service: Endoscopy;  Laterality: N/A;  . TONSILLECTOMY    . 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 Per chart review lives with her children. Independent with assistive device. 1 level home with level entry. Good supportive family. Presented 11/03/2020 with persistent headache and left-sided weakness x3 days. MRI showed punctate acute infarction at the medial right parieto-occipital junction. Chronic small vessel ischemic changes. MRA of the head negative for large vessel occlusion. MRI cervical spine with persistent patchy signal abnormality involving the central dorsal aspect of the cervical spinal cord extending from C2-C7-T1 relatively stable and unchanged from previous tracings. Admission chemistries unremarkable except chloride 113 creatinine 1.70 hemoglobin 9.2 hemoglobin A1c 6.3. Echocardiogram with ejection fraction of 60 to 65% grade 1 diastolic dysfunction. Currently maintained on aspirin and Plavix for CVA prophylaxis x3 weeks and aspirin alone. Subcutaneous heparin for DVT prophylaxis. Awaiting plan for loop recorder. Therapy evaluations completed due to patient's left-sided weakness was admitted for a comprehensive rehab program.  Pt presents with mild acute on chronic cognitive impairment as evidenced by SLUMS score 18/30 [facility] =  27+). Pt had previous CVA January 2022 with residual cognitive deficits, pt reports mild exacerbation following  this CVA mostly in area of memory and processing. Pt able to recall 0/5 unrelated words with short delay, mod cues increased to 1/5. Pt with deficits in working memory and mathematics with limited insight into errors during assessment. Pt's sister does med management but pt is responsible for money management and check writing. Pt would like to improve memory and processing speech. Expressive/receptive language judged to be WNL for age and function. Pt will benefit from skilled ST to maximize safety and independence with daily routine and train in effective compensatory strategies for cognitive deficits.     Skilled Therapeutic Interventions          Pt participating in [place] Mental Status Examination (SLUMS) as well as further non-standardized assessments of cognition.   SLP Assessment  Patient will need skilled [place] [facility] during CIR admission    Recommendations  Oral Care Recommendations: Oral care BID Patient destination: Home Follow up Recommendations: Home Health SLP Equipment Recommended: None recommended by SLP    SLP Frequency 3 to 5 out of 7 days   SLP Duration  SLP Intensity  SLP Treatment/Interventions 7-10 days  Minumum of 1-2 x/day, 30 to 90 minutes  Cognitive remediation/compensation;Therapeutic Activities;Therapeutic Exercise;Internal/external aids;Cueing hierarchy;Functional tasks;Medication managment;Patient/family education    Pain Pain Assessment Pain Scale: 0-10 Pain Score: 0-No pain  Prior Functioning Cognitive/Linguistic Baseline: Baseline deficits Baseline deficit details: processing and initiation Type of Home: Apartment  Lives With: Son Available Help at Discharge: Family;Available PRN/intermittently Vocation: Retired  SLP Evaluation Cognition Overall Cognitive Status: Impaired/Different from baseline Arousal/Alertness: Awake/alert Orientation Level: Oriented X4 Attention: Sustained;Selective Sustained  Attention: Appears intact Selective Attention: Impaired Selective Attention Impairment: Functional basic;Functional complex Memory: Impaired Memory Impairment: Storage deficit;Retrieval deficit Awareness: Impaired Awareness Impairment: Anticipatory impairment Problem Solving: Impaired Problem Solving Impairment: Functional basic Executive Function: Initiating Initiating: Impaired Safety/Judgment: Impaired  Comprehension Auditory Comprehension Overall Auditory Comprehension: Appears within functional limits for tasks assessed Expression Expression Primary Mode of Expression: Verbal Verbal Expression Overall Verbal Expression: Appears within functional limits for tasks assessed Oral Motor Oral Motor/Sensory Function Overall Oral Motor/Sensory Function: Within functional limits Motor Speech Overall Motor Speech: Appears within functional limits for tasks assessed  Care Tool Care Tool Cognition Expression of Ideas and Wants Expression of Ideas and Wants: Without difficulty (complex and basic) - expresses complex messages without difficulty and with speech that is clear and easy to understand   Understanding Verbal and Non-Verbal Content Understanding Verbal and Non-Verbal Content: Usually understands - understands most conversations, but misses some part/intent of message. Requires cues at times to understand   Memory/Recall Ability *first 3 days only Memory/Recall Ability *first 3 days only: Current season;That he or she is in a hospital/hospital unit    Short Term Goals: Week 1: SLP Short Term Goal 1 (Week 1): Pt will increase recall of functional and novel information to 70% accuracy with mod A cues SLP Short Term Goal 2 (Week 1): Pt will complete mildly complex problem solving tasks with min A for error awareness and self monitoring. SLP Short Term Goal 3 (Week 1): Pt will complete money management tasks with min A verbal cues  Refer to Care Plan for Long Term  Goals  Recommendations for other services: None   Discharge Criteria: Patient will be discharged from SLP if patient refuses treatment 3 consecutive times without medical reason, if treatment goals not met, if there is a  change in medical status, if patient makes no progress towards goals or if patient is discharged from hospital.  The above assessment, treatment plan, treatment alternatives and goals were discussed and mutually agreed upon: by patient  Dewaine Conger 11/08/2020, 11:01 AM

## 2020-11-08 NOTE — Progress Notes (Signed)
Occupational Therapy Session Note  Patient Details  Name: CAILAH REACH MRN: [id number] Date of Birth: 1955-09-11  Today's Date: 11/08/2020 OT Individual Time: 0735-0800 OT Individual Time Calculation (min): 25 min    Short Term Goals: Week 1:  OT Short Term Goal 1 (Week 1): STGs equal to LTGs set at supervision to modified independent level.  Skilled Therapeutic Interventions/Progress Updates:    Pt received supine with no c/o pain agreeable to OT session. She completed bed mobility with supervision to EOB. CGA to stand from EOB. Ambulatory transfer using RW to the sink with supervision overall. She was able to appropriately use BUE to complete oral care and grooming tasks at the sink. Supervision while completing this in standing.Pt took a seated rest break while her and OT discussed d/c planning and HHOT. Pt completed 200 ft of functional mobility after rest break at slow pace, CGA to supervision overall with the RW. She required several standing rest break and was able to self monitor need for these. Pt returned to her room and was left supine with all needs met, bed alarm set.   Therapy Documentation Precautions:  Precautions Precautions: Fall Precaution Comments: mild L hemi Restrictions Weight Bearing Restrictions: No   Therapy/Group: Individual Therapy  Curtis Sites 11/08/2020, 6:42 AM

## 2020-11-09 DIAGNOSIS — R112 Nausea with vomiting, unspecified: Secondary | ICD-10-CM | POA: Diagnosis not present

## 2020-11-09 DIAGNOSIS — D709 Neutropenia, unspecified: Secondary | ICD-10-CM | POA: Diagnosis not present

## 2020-11-09 DIAGNOSIS — I6389 Other cerebral infarction: Secondary | ICD-10-CM | POA: Diagnosis not present

## 2020-11-09 LAB — CBC WITH DIFFERENTIAL/PLATELET
Abs Immature Granulocytes: 0.01 10*3/uL (ref 0.00–0.07)
Basophils Absolute: 0 10*3/uL (ref 0.0–0.1)
Basophils Relative: 0 %
Eosinophils Absolute: 0.1 10*3/uL (ref 0.0–0.5)
Eosinophils Relative: 2 %
HCT: 27.5 % — ABNORMAL LOW (ref 36.0–46.0)
Hemoglobin: 9.7 g/dL — ABNORMAL LOW (ref 12.0–15.0)
Immature Granulocytes: 0 %
Lymphocytes Relative: 30 %
Lymphs Abs: 0.9 10*3/uL (ref 0.7–4.0)
MCH: 32.8 pg (ref 26.0–34.0)
MCHC: 35.3 g/dL (ref 30.0–36.0)
MCV: 92.9 fL (ref 80.0–100.0)
Monocytes Absolute: 0.2 10*3/uL (ref 0.1–1.0)
Monocytes Relative: 8 %
Neutro Abs: 1.7 10*3/uL (ref 1.7–7.7)
Neutrophils Relative %: 60 %
Platelets: 134 10*3/uL — ABNORMAL LOW (ref 150–400)
RBC: 2.96 MIL/uL — ABNORMAL LOW (ref 3.87–5.11)
RDW: 16.5 % — ABNORMAL HIGH (ref 11.5–15.5)
WBC: 2.9 10*3/uL — ABNORMAL LOW (ref 4.0–10.5)
nRBC: 0 % (ref 0.0–0.2)

## 2020-11-09 LAB — GLUCOSE, CAPILLARY
Glucose-Capillary: 139 mg/dL — ABNORMAL HIGH (ref 70–99)
Glucose-Capillary: 121 mg/dL — ABNORMAL HIGH (ref 70–99)
Glucose-Capillary: 156 mg/dL — ABNORMAL HIGH (ref 70–99)
Glucose-Capillary: 130 mg/dL — ABNORMAL HIGH (ref 70–99)

## 2020-11-09 LAB — COMPREHENSIVE METABOLIC PANEL
ALT: 34 U/L (ref 0–44)
AST: 47 U/L — ABNORMAL HIGH (ref 15–41)
Albumin: 3.2 g/dL — ABNORMAL LOW (ref 3.5–5.0)
Alkaline Phosphatase: 51 U/L (ref 38–126)
Anion gap: 6 (ref 5–15)
BUN: 20 mg/dL (ref 8–23)
CO2: 21 mmol/L — ABNORMAL LOW (ref 22–32)
Calcium: 9 mg/dL (ref 8.9–10.3)
Chloride: 109 mmol/L (ref 98–111)
Creatinine, Ser: 1.78 mg/dL — ABNORMAL HIGH (ref 0.44–1.00)
GFR, Estimated: 31 mL/min — ABNORMAL LOW (ref >60)
Glucose, Bld: 151 mg/dL — ABNORMAL HIGH (ref 70–99)
Potassium: 4.1 mmol/L (ref 3.5–5.1)
Sodium: 136 mmol/L (ref 135–145)
Total Bilirubin: 0.6 mg/dL (ref 0.3–1.2)
Total Protein: 6.6 g/dL (ref 6.5–8.1)

## 2020-11-09 MED ORDER — SORBITOL 70 % SOLN
30.0000 mL | Freq: Once | Status: AC
  Administered 2020-11-09: 30 mL via ORAL
  Filled 2020-11-09: qty 30

## 2020-11-09 MED ORDER — PROMETHAZINE HCL 25 MG PO TABS
12.5000 mg | ORAL_TABLET | Freq: Four times a day (QID) | ORAL | Status: DC | PRN
  Administered 2020-11-09 – 2020-11-18 (×16): 12.5 mg via ORAL
  Filled 2020-11-09 (×16): qty 1

## 2020-11-09 NOTE — Progress Notes (Signed)
Inpatient Rehabilitation Care Coordinator Assessment and Plan Patient Details  Name: Erica [name] MRN: [id number] Date of Birth: 08-Jun-1956  Today's Date: 11/09/2020  Hospital Problems: Principal Problem:   Parietal lobe infarction [facility])  Past Medical History:  Past Medical History:  Diagnosis Date  . Arthritis    especially in shoulders  . Asthma   . Depression   . Diabetes mellitus   . GERD (gastroesophageal reflux disease)   . Headache(784.0)    "mild"  . Hypertension   . Mental disorder    depression  . Neuropathy    feet   . Pain    arthritis pain - takes tramadol as needed  . Rectal polyp    very little bleeding with bowel movements- no pain  . Suicide attempt [facility])    Past Surgical History:  Past Surgical History:  Procedure Laterality Date  . ANAL RECTAL MANOMETRY N/A 07/13/2016   Procedure: ANO RECTAL MANOMETRY;  Surgeon: Leighton Ruff, MD;  Location: [facility] ENDOSCOPY;  Service: Endoscopy;  Laterality: N/A;  . CESAREAN SECTION    . EUS N/A 11/21/2012   Procedure: LOWER ENDOSCOPIC ULTRASOUND (EUS);  Surgeon: Arta Silence, MD;  Location: [facility];  Service: Endoscopy;  Laterality: N/A;  . FLEXIBLE SIGMOIDOSCOPY N/A 11/21/2012   Procedure: FLEXIBLE SIGMOIDOSCOPY;  Surgeon: Arta Silence, MD;  Location: [facility];  Service: Endoscopy;  Laterality: N/A;  . FLEXIBLE SIGMOIDOSCOPY N/A 01/28/2013   Procedure: FLEXIBLE SIGMOIDOSCOPY;  Surgeon: Leighton Ruff, MD;  Location: [facility];  Service: Endoscopy;  Laterality: N/A;  . LAPAROSCOPIC LOW ANTERIOR RESECTION N/A 01/29/2013   Procedure: LAPAROSCOPIC LOW ANTERIOR RESECTION, Rigid Proctoscopy;  Surgeon: Leighton Ruff, MD;  Location: [facility] ORS;  Service: General;  Laterality: N/A;  . LAPAROSCOPIC SIGMOID COLECTOMY N/A 11/14/2012   Procedure: DIAGNOSTIC LAPAROSCOPY AND SIGMOIDMOIDOSCOPY ;  Surgeon: Rolm Bookbinder, MD;  Location: [facility] ORS;  Service: General;  Laterality: N/A;  . RECTAL ULTRASOUND N/A 07/13/2016    Procedure: RECTAL ULTRASOUND;  Surgeon: Leighton Ruff, MD;  Location: [facility] ENDOSCOPY;  Service: Endoscopy;  Laterality: N/A;  . TONSILLECTOMY    . TONSILLECTOMY AND ADENOIDECTOMY     Social History:  reports that she quit smoking about 2 years ago. Her smoking use included cigarettes. She has a 10.00 pack-year smoking history. She has never used smokeless tobacco. She reports previous alcohol use. She reports previous drug use. Drug: "Crack" cocaine.  Family / Support Systems Marital Status: Single Patient Roles: Parent,Other (Comment) (sibling) Children: James-son [phone] Other Supports: [name]-sister [phone] Anticipated Caregiver: Sister and son Ability/Limitations of Caregiver: Sister lives in [place] and son lives with but taking classes at home Caregiver Availability: Intermittent Family Dynamics: Close knit with sister and son, feels son should move out and be on his own but is still with her. At times he causes her stress. She is stubborn and tends to do what she wants.  Social History Preferred language: English Religion: None Cultural Background: No issues Education: HS Read: Yes Write: Yes Employment Status: Disabled Public relations account executive Issues: No issues Guardian/Conservator: None-according to MD pt is capable of making her own decisions while here. Sister comes daily to see her, son does not like hospitals   Abuse/Neglect Abuse/Neglect Assessment Can Be Completed: Yes Physical Abuse: Denies Verbal Abuse: Denies Sexual Abuse: Denies Exploitation of patient/patient's resources: Denies Self-Neglect: Denies  Emotional Status Pt's affect, behavior and adjustment status: Pt can't belive she is back here when was here in 07/2020 after another stroke. She voiced: " They say I had a  little one." She hopes to do well and regain her independence again Recent Psychosocial Issues: other health issues was here in 07/2020 after another stroke. She feels she can  manage like she always has Psychiatric History: No history deferred depression screen due to coping appropriately and can express herself and her concerns. Substance Abuse History: Prior history of substance abuse and ETOH pt reports this is in the past and it has been years.  Patient / Family Perceptions, Expectations & Goals Pt/Family understanding of illness & functional limitations: Pt and sister are able to explain her stroke and deficits. Pt feels she is not much different than before and hopes to get back to mod/i level. Pt has spoken with MD and feels understands her condition and treatment going forward. Premorbid pt/family roles/activities: Mom, sister, retiree, freind, etc Anticipated changes in roles/activities/participation: resume Pt/family expectations/goals: Pt states: " I want to get back to where I was before this."  Sister states: " I hope she does well and can get back to using her walker on her own."  [employer]: None Premorbid Home Care/DME Agencies: Other (Comment) (had [facility] last time and has rw, 3 in 1 and tub bench) Transportation available at discharge: Sister  Discharge Planning Living Arrangements: Children Support Systems: Dealer Type of Residence: Private residence Insurance Resources: [employer] (specify [employer] (specify) ([employer] Medicare) Financial Resources: SSD Financial Screen Referred: No Living Expenses: Education officer, community Management: Patient Does the patient have any problems obtaining your medications?: No Home Management: Pt and son Patient/Family Preliminary Plans: Return home with son who lives with her but does not really assist her, according to pt and sister. She went home at a supervision level last time and hopefully can reach this level again, due to does not have 24/7 care at [place] Coordinator Barriers to Discharge: Decreased caregiver support Care Coordinator  Barriers to Discharge Comments: son does not assist although lives with her Care Coordinator Anticipated Follow Up Needs: HH/OP  Clinical Impression Pleasant female who has been here recently-07/2020 and is aware of the rehab process and is glad to be back. Sister is very involved and provided supervision last time, son still lives her her but had planned to move out last time. Will work on discharge needs.  Elease Hashimoto 11/09/2020, 11:08 AM

## 2020-11-09 NOTE — Progress Notes (Signed)
Pt vomited after taking home med with food. Says that has either vomiting or diarrhea after taking medication at home. Not new but usually takes an antiemetic. Pt doesn't have order for antiemetic.

## 2020-11-09 NOTE — Progress Notes (Signed)
Occupational Therapy Session Note  Patient Details  Name: Erica [name] MRN: [id number] Date of Birth: [birth year]  Today's Date: 11/09/2020 OT Individual Time: 2119-4174 OT Individual Time Calculation: 30 min  Short Term Goals: Week 1:  OT Short Term Goal 1 (Week 1): STGs equal to LTGs set at supervision to modified independent level.  Skilled Therapeutic Interventions/Progress Updates:    Pt received supine in bed reporting n/v in previous PT session and declining OOB activity due to continued nausea and "feeling weak". With therapeutic listening and encouragement, pt agreeable to grooming tasks including washing face, oral hygiene, and applying lotion set up A in bed. Pt engaged in LUE (hand) FM conditioning using yellow theraputty. Pt education on POC, stroke symptoms, and discussion on d/c planning for home. Pt remained supine, bed alarm set, all needs met, and staff in room.   Therapy Documentation Precautions:  Precautions Precautions: Fall Precaution Comments: mild L hemi Restrictions Weight Bearing Restrictions: No Pain: Pain Assessment Pain Scale: 0-10 Pain Score: 0-No pain   Therapy/Group: Individual Therapy  Mellissa Kohut 11/09/2020, 12:37 PM

## 2020-11-09 NOTE — Progress Notes (Signed)
Occupational Therapy Session Note  Patient Details  Name: Erica [name] MRN: [id number] Date of Birth: June 29, 1955  Today's Date: 11/09/2020 OT Individual Time: 1135-1144 OT Individual Time Calculation (min): 9 min  and Today's Date: 11/09/2020 Session 1 OT Missed Time: 21 Minutes Session 1 Missed Time Reason: Patient ill (comment);Patient fatigue  Session 2 OT Missed Time: 71 Minutes Session 2 Missed Time Reason: Patient ill; Patient fatigue  Short Term Goals: Week 1:  OT Short Term Goal 1 (Week 1): STGs equal to LTGs set at supervision to modified independent level.  Skilled Therapeutic Interventions/Progress Updates:   Session 1: Met pt lying supine in bed. Pt expressed she did not want to do anything with therapy due to her feeling weak from vomiting episode this morning.  OT and OTS offered self-care time or functional activities multiple times and pt declined. Pt was agreeable to OT tx after lunch. Left pt lying supine in bed, bed alarm on and call bell within reach.   Session 2: Met pt lying on R side in bed. OTS offered a shower or to complete functional activities in bed and pt declined. Pt reported "I don't think today is my day". Left pt lying in bed, bed alarm on and call bell within reach. OTS returned to check on pt and pt continues to report of fatigue and feeling ill. Notified RN.   Therapy Documentation Precautions:  Precautions Precautions: Fall Precaution Comments: mild L hemi Restrictions Weight Bearing Restrictions: No General: General OT Amount of Missed Time: 21 Minutes & 60 Minutes     Therapy/Group: Individual Therapy  Bayard Males 11/09/2020, 12:34 PM

## 2020-11-09 NOTE — IPOC Note (Signed)
Overall Plan of Care [facility]) Patient Details Name: Erica [name] MRN: [id number] DOB: 11-10-1955  Admitting Diagnosis: Parietal lobe infarction [facility])  Hospital Problems: Principal Problem:   Parietal lobe infarction [facility])     Functional Problem List: Nursing    PT Balance,Behavior,Endurance,Motor,Nutrition,Pain,Perception,Safety,Sensory,Skin Integrity  OT Balance,Motor,Sensory,Cognition,Vision  SLP Cognition  TR         Basic ADL's: OT Grooming,Bathing,Dressing,Toileting     Advanced  ADL's: OT Simple Meal Preparation     Transfers: PT Bed Mobility,Bed to Chair,Car,Furniture  OT Toilet,Tub/Shower     Locomotion: PT Ambulation,Wheelchair Mobility,Stairs     Additional Impairments: OT Fuctional Use of Upper Extremity  SLP        TR      Anticipated Outcomes Item Anticipated Outcome  Self Feeding independent  Swallowing      Basic self-care  modified independent  Toileting  modified independent   Bathroom Transfers supervision  Bowel/Bladder     Transfers  spv  Locomotion  spv  Communication     Cognition  Supervision for basic cog  Pain     Safety/Judgment      Therapy Plan: PT Intensity: Minimum of 1-2 x/day ,45 to 90 minutes PT Frequency: 5 out of 7 days PT Duration Estimated Length of Stay: 7-10 days OT Intensity: Minimum of 1-2 x/day, 45 to 90 minutes OT Frequency: 5 out of 7 days OT Duration/Estimated Length of Stay: 7-9 days SLP Intensity: Minumum of 1-2 x/day, 30 to 90 minutes SLP Frequency: 3 to 5 out of 7 days SLP Duration/Estimated Length of Stay: 7-10 days   Due to the current state of emergency, patients may not be receiving their 3-hours of Medicare-mandated therapy.   Team Interventions: Nursing Interventions    PT interventions Ambulation/gait training,DME/adaptive equipment instruction,Psychosocial support,Functional electrical stimulation,UE/LE Strength taining/ROM,Skin care/wound management,Balance/vestibular  training,UE/LE Coordination activities,Cognitive remediation/compensation,Functional mobility training,Splinting/orthotics,Visual/perceptual remediation/compensation,Community reintegration,Neuromuscular re-education,Stair training,Wheelchair propulsion/positioning,Discharge planning,Pain management,Therapeutic Activities,Disease management/prevention,Patient/family education,Therapeutic Exercise  OT Interventions Balance/vestibular training,Cognitive remediation/compensation,Community reintegration,DME/adaptive equipment instruction,Discharge planning,Disease mangement/prevention,Functional mobility training,Neuromuscular re-education,Patient/family education,Self Care/advanced ADL retraining,Therapeutic Activities,UE/LE Coordination activities,Visual/perceptual remediation/compensation,UE/LE Strength taining/ROM,Psychosocial support,Therapeutic Exercise  SLP Interventions Cognitive remediation/compensation,Therapeutic Activities,Therapeutic Exercise,Internal/external aids,Cueing hierarchy,Functional tasks,Medication managment,Patient/family education  TR Interventions    SW/CM Interventions Discharge Planning,Psychosocial Support,Patient/Family Education   Barriers to Discharge MD  Medical stability, Home enviroment access/loayout, Lack of/limited family support, Weight, Medication compliance and Behavior  Nursing      PT Decreased caregiver support h/o multiple CVAs  OT      SLP Other (comments) none for ST  SW Decreased caregiver support son does not assist although lives with her   Team Discharge Planning: Destination: PT-Home ,OT- Home , SLP-Home Projected Follow-up: PT-Home health PT,24 hour supervision/assistance, OT-  Home health OT, SLP-Home Health SLP Projected Equipment Needs: PT-To be determined, OT- None recommended by OT, SLP-None recommended by SLP Equipment Details: PT-owns RW and SPC, OT-  Patient/family involved in discharge planning: PT- Patient,  OT-Patient,  SLP-Patient  MD ELOS: 7-10 days Medical Rehab Prognosis:  Good Assessment: Pt is a 65 yr old female with Pulm fibrosis, IDDM, hx of rectal CA, with 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  Goals mod I to supervision.     See Team Conference Notes for weekly updates to the plan of care

## 2020-11-09 NOTE — Progress Notes (Signed)
Physical Therapy Session Note  Patient Details  Name: Erica [name] MRN: [id number] Date of Birth: Sep 07, 1955  Today's Date: 11/09/2020 PT Individual Time:  -      Short Term Goals: Week 1:  PT Short Term Goal 1 (Week 1): STG = LTG based on ELOS  Skilled Therapeutic Interventions/Progress Updates:    Pain:  Pt reports no pain.  Treatment to tolerance.  Rest breaks and repositioning as needed.  Pt initially resting supine, easily awakened and agreeable to treatment session w/focus on dynamic gait, balance, NMRE. Pt supine to sit w/bedrail, supervision, additional time.  Able to maintain balance w/supervision while reaching to floor to fix R sock, crosses L/R to fix L sock. stand pivot transfer to wc w/min assist, additional time.   Once in wc, pt transported to gym for continued session.   Gait:   73f w/2 turns, overall cga.  [place] included head turns L/R every 3 steps x 242fw/slowed cadence w/this; Changes in gait speed 2040f 2 w/improved gait pattern at > preferred gait speed.  Standing balance/coordination task:  Standing w/RW worked on tapping 5in cones placed in 1/2 clock configuration 12-3 w/R, 12-9 w/L w/decreased coordination vs perception LLE vs R.  Repeated w/single UE support.  Pt fatigues quickly w/task requiring seated rest before repeating above sequence.  Overall min to cga.  Repeated gait as above w/emphasis on increasing gait speed above pt preferred gait speed, improved gait speed.  Slower cadence results in more pronounced decreased stance time L, decreased step length R.  Pt c/o need for BM.  Transported to room.  Gait x 15 ft doorway to BR to commode transfer w/min assist.  Manages pants w/min assist for balance.  Pt c/o nausea.  Pt provided w/basin and vomited moderate amount while seated.  Nursing called to assess pt.  Once vomiting subsided/pt no londer w/need for BM, did void.  Performs hygiene in sitting w/supervision.  Sit to stand w/min assist and  manages pants w/min assist for balance only. Gait commode to bed w/min assist, sit to supine w/min assist. Pt left supine w/rails up x 4, alarm set, bed in lowest position, and needs in reach.   Therapy Documentation Precautions:  Precautions Precautions: Fall Precaution Comments: mild L hemi Restrictions Weight Bearing Restrictions: No   Therapy/Group: Individual Therapy  BarCallie FieldingT [place]30/2022, 7:42 AM

## 2020-11-09 NOTE — Progress Notes (Signed)
PROGRESS NOTE   Subjective/Complaints:   Pt reports she needs to have a BM- LBM 2 days ago- per nurse, has refused all bowel meds- however also vomited this AM which could be due to meds on empty stomach OR from constipation? Voiding OK.  Didn't get loop recorder- decided against it, per pt.   Per chart WBC of 2.9 is low for her- usually ~ 5.0 Plts are pretty normal for her 130s-150s.  Cr at baseline 1.6-1.8   ROS:  Pt denies SOB, abd pain, CP, and vision changes  Objective:   No results found. Recent Labs    11/09/20 0539  WBC 2.9*  HGB 9.7*  HCT 27.5*  PLT 134*   Recent Labs    11/09/20 0539  NA 136  K 4.1  CL 109  CO2 21*  GLUCOSE 151*  BUN 20  CREATININE 1.78*  CALCIUM 9.0    Intake/Output Summary (Last 24 hours) at 11/09/2020 1137 Last data filed at 11/09/2020 0757 Gross per 24 hour  Intake 480 ml  Output 1 ml  Net 479 ml        Physical Exam: Vital Signs Blood pressure 138/71, pulse 62, temperature 98 F (36.7 C), temperature source Oral, resp. rate 20, weight 82.3 kg, SpO2 99 %.    General: awake, alert, appropriate, sitting up in bed;  NAD HENT: conjugate gaze; oropharynx moist CV: regular rate; no JVD Pulmonary: CTA B/L; no W/R/R- good air movement GI: soft, NT, ND, (+)BS, hypoactive Psychiatric: cordial, but flat affect Neurological: alert Extremities: No clubbing, cyanosis, or edema. Pulses are 2+ Psych: Pt's affect is appropriate. Pt is cooperative Skin: Clean and intact without signs of breakdown Neuro: Pt is cognitively appropriate with normal insight, memory, and awareness. Cranial nerves 2-12 are intact.  LT diminished LUE. Decreased depth perception and [place] LUE>LLE. RUE and RLE 4/5. LUE and LLE 3+ to 4-/5.   Musculoskeletal: Full ROM, No pain with AROM or PROM in the neck, trunk, or extremities. Posture appropriate     Assessment/Plan: 1. Functional deficits which require  3+ hours per day of interdisciplinary therapy in a comprehensive inpatient rehab setting.  Physiatrist is providing close team supervision and 24 hour management of active medical problems listed below.  Physiatrist and rehab team continue to assess barriers to discharge/monitor patient progress toward functional and medical goals  Care Tool:  Bathing    Body parts bathed by patient: Right arm,Left arm,Chest,Abdomen,Front perineal area,Buttocks,Right upper leg,Left upper leg,Right lower leg,Left lower leg,Face         Bathing assist Assist Level: Minimal Assistance - Patient > 75%     Upper Body Dressing/Undressing Upper body dressing   What is the patient wearing?: Pull over shirt    Upper body assist Assist Level: Set up assist    Lower Body Dressing/Undressing Lower body dressing      What is the patient wearing?: Pants,Incontinence brief     Lower body assist Assist for lower body dressing: Minimal Assistance - Patient > 75%     Toileting Toileting    Toileting assist Assist for toileting: Contact Guard/Touching assist     Transfers Chair/bed transfer  Transfers assist  Chair/bed transfer assist level: Minimal Assistance - Patient > 75%     Locomotion Ambulation   Ambulation assist      Assist level: Minimal Assistance - Patient > 75% Assistive device: Walker-rolling Max distance: 30'   Walk 10 feet activity   Assist     Assist level: Minimal Assistance - Patient > 75% Assistive device: Walker-rolling   Walk 50 feet activity   Assist    Assist level: Minimal Assistance - Patient > 75% Assistive device: Walker-rolling    Walk 150 feet activity   Assist Walk 150 feet activity did not occur: Safety/medical concerns         Walk 10 feet on uneven surface  activity   Assist Walk 10 feet on uneven surfaces activity did not occur: Safety/medical concerns         Wheelchair     Assist Will patient use wheelchair at  discharge?: No             Wheelchair 50 feet with 2 turns activity    Assist            Wheelchair 150 feet activity     Assist          Blood pressure 138/71, pulse 62, temperature 98 F (36.7 C), temperature source Oral, resp. rate 20, weight 82.3 kg, SpO2 99 %.  Medical Problem List and Plan: 1.Left-sided weaknesssecondary to punctate parietal occipital infarction in the setting of prior right thalamic caudate infarction.Patient declined loop recorder -patient may shower -ELOS/Goals: mod I to supervision, 7-9 days  con't PT, OT and SLP 2. Antithrombotics: -DVT/anticoagulation:Subcutaneous heparin -antiplatelet therapy: Aspirin 81 mg daily and Plavix 75 mg dailyx 3 weeks then aspirin alone 3. Pain Management:Voltaren gel as needed Neurontin 300 mg twice daily as needed  -added tramadol 6m q8 prn for severe headache 4. Mood:Effexor 75 mg daily, BuSpar 10 mg twice daily, trazodone 50 mg nightly -antipsychotic agents: N/A 5. Neuropsych: This patientiscapable of making decisions on herown behalf. 6. Skin/Wound Care:Routine skin checks 7. Fluids/Electrolytes/Nutrition:Routine in and outs with follow-up chemistries 8. Hypertension. Cozaar 50 mg daily, Norvasc 5 mg daily -borderline control at present--observe for pattern 9. Idiopathic pulmonary fibrosis/COPD.Pirfenidone801 mg 3 times daily, continue inhalers  -lungs clear, symptoms controlled at present 10. Diabetes mellitus. Lantus insulin 25 units twice daily. Check blood sugars before meals and at bedtime  CBG (last 3)  Recent Labs    11/08/20 2048 11/09/20 0551 11/09/20 1112  GLUCAP 119* 139* 121*    5/30- BGs stable- con't regimen 11. CKD stage III. Follow-up chemistries 12. History of rectal cancer. Follow-up outpatient 13. Hyperlipidemia. Crestor 14. GERD. Pepcid 15. Nausea/vomiting  5/30-  added phenergan 12.5 mg q6 hours prn; also if no BM, will have pt take sorbitol after 3pm today- of note, has been refusing bowel meds.  16. Leukopenia  5/30- will recheck labs in AM- concerned it's dipping- but might improve- will check in AM    LOS: 3 days A FACE TO FACE EVALUATION WAS PERFORMED  Trason Shifflet 11/09/2020, 11:37 AM

## 2020-11-09 NOTE — Progress Notes (Signed)
[facility] Individual Statement of Services  Patient Name:  Erica [name]  Date:  11/09/2020  Welcome to the [facility].  Our goal is to provide you with an individualized program based on your diagnosis and situation, designed to meet your specific needs.  With this comprehensive rehabilitation program, you will be expected to participate in at least 3 hours of rehabilitation therapies Monday-Friday, with modified therapy programming on the weekends.  Your rehabilitation program will include the following services:  Physical Therapy (PT), Occupational Therapy (OT), Speech Therapy (ST), 24 hour per day rehabilitation nursing, Care Coordinator, Rehabilitation Medicine, Nutrition Services and Pharmacy Services  Weekly team conferences will be held on Wednesday to discuss your progress.  Your Inpatient Rehabilitation Care Coordinator will talk with you frequently to get your input and to update you on team discussions.  Team conferences with you and your family in attendance may also be held.  Expected length of stay: 7-10 days  Overall anticipated outcome: supervision cues  Depending on your progress and recovery, your program may change. Your Inpatient Rehabilitation Care Coordinator will coordinate services and will keep you informed of any changes. Your Inpatient Rehabilitation Care Coordinator's name and contact numbers are listed  below.  The following services may also be recommended but are not provided by the [facility]:   [place] will be made to provide these services after discharge if needed.  Arrangements include referral to agencies that provide these services.  Your insurance has been verified to be:  [employer] Your primary doctor is:  Nolene Ebbs  Pertinent information will be shared with your doctor and your insurance  company.  Inpatient Rehabilitation Care Coordinator:  Ovidio Kin, [place] or Emilia Beck  Information discussed with and copy given to patient by: Elease Hashimoto, 11/09/2020, 11:10 AM

## 2020-11-10 DIAGNOSIS — Z794 Long term (current) use of insulin: Secondary | ICD-10-CM

## 2020-11-10 DIAGNOSIS — N1832 Chronic kidney disease, stage 3b: Secondary | ICD-10-CM

## 2020-11-10 DIAGNOSIS — E1165 Type 2 diabetes mellitus with hyperglycemia: Secondary | ICD-10-CM

## 2020-11-10 DIAGNOSIS — G441 Vascular headache, not elsewhere classified: Secondary | ICD-10-CM

## 2020-11-10 DIAGNOSIS — I6389 Other cerebral infarction: Secondary | ICD-10-CM | POA: Diagnosis not present

## 2020-11-10 DIAGNOSIS — D72819 Decreased white blood cell count, unspecified: Secondary | ICD-10-CM

## 2020-11-10 LAB — CBC WITH DIFFERENTIAL/PLATELET
Abs Immature Granulocytes: 0.02 10*3/uL (ref 0.00–0.07)
Basophils Absolute: 0 10*3/uL (ref 0.0–0.1)
Basophils Relative: 0 %
Eosinophils Absolute: 0.1 10*3/uL (ref 0.0–0.5)
Eosinophils Relative: 2 %
HCT: 28.7 % — ABNORMAL LOW (ref 36.0–46.0)
Hemoglobin: 10.3 g/dL — ABNORMAL LOW (ref 12.0–15.0)
Immature Granulocytes: 1 %
Lymphocytes Relative: 30 %
Lymphs Abs: 1.1 10*3/uL (ref 0.7–4.0)
MCH: 33 pg (ref 26.0–34.0)
MCHC: 35.9 g/dL (ref 30.0–36.0)
MCV: 92 fL (ref 80.0–100.0)
Monocytes Absolute: 0.3 10*3/uL (ref 0.1–1.0)
Monocytes Relative: 8 %
Neutro Abs: 2.2 10*3/uL (ref 1.7–7.7)
Neutrophils Relative %: 59 %
Platelets: 132 10*3/uL — ABNORMAL LOW (ref 150–400)
RBC: 3.12 MIL/uL — ABNORMAL LOW (ref 3.87–5.11)
RDW: 16.7 % — ABNORMAL HIGH (ref 11.5–15.5)
WBC: 3.7 10*3/uL — ABNORMAL LOW (ref 4.0–10.5)
nRBC: 0 % (ref 0.0–0.2)

## 2020-11-10 LAB — GLUCOSE, CAPILLARY
Glucose-Capillary: 66 mg/dL — ABNORMAL LOW (ref 70–99)
Glucose-Capillary: 96 mg/dL (ref 70–99)
Glucose-Capillary: 87 mg/dL (ref 70–99)
Glucose-Capillary: 138 mg/dL — ABNORMAL HIGH (ref 70–99)
Glucose-Capillary: 232 mg/dL — ABNORMAL HIGH (ref 70–99)

## 2020-11-10 NOTE — Progress Notes (Signed)
Hypoglycemic Event  CBG: 66  Treatment: 8 oz juice/soda  Symptoms: None  Follow-up CBG: QGBE:0100 CBG Result:96  Possible Reasons for Event: Unknown  Comments/MD notified:    Beverley Fiedler

## 2020-11-10 NOTE — Progress Notes (Signed)
Occupational Therapy Session Note  Patient Details  Name: Erica [name] MRN: [id number] Date of Birth: Dec 24, 1955  Today's Date: 11/10/2020 OT Individual Time: 1300-1400 OT Individual Time Calculation (min): 60 min    Short Term Goals: Week 1:  OT Short Term Goal 1 (Week 1): STGs equal to LTGs set at supervision to modified independent level.  Skilled Therapeutic Interventions/Progress Updates:    Pt received seated EOB finishing lunch, agreeable to OT and reporting slight nausea, but feeling better than yesterday. Sit<>stand CGA using RW with mod vc's throughout session for hand placement and safe RW management (needs reinforcement). Pt ambulated bed<>TTB with RW at [facility] and no overt LOB. Pt showered with close spvsn seated and in stance. UB dressing set up A; LB dressing close spvsn seated, CGA in stance. Pt donned/doffed nonskid socks with close spvsn. Pt completed grooming tasks standing at sink with RW at close spvsn level. Seated rest breaks throughout session due to fatigue from BADLs. Pt requesting to walk in hallway; ambulated ~75' before needing seated rest break and reporting lightheadedness. Vitals within norms (see below). Pt remained supine in bed (sit>sup close spvsn with bed rails), alarm set, call bell in reach, and all needs met.  Therapy Documentation Precautions:  Precautions Precautions: Fall Precaution Comments: mild L hemi Restrictions Weight Bearing Restrictions: No Vital Signs: Following ambulation: BP: 150/79, HR 109, 96% O2  Pain: Pain Assessment Pain Scale: 0-10 Pain Score: 0-No pain   Therapy/Group: Individual Therapy  Mellissa Kohut 11/10/2020, 3:14 PM

## 2020-11-10 NOTE — Progress Notes (Signed)
Speech Language Pathology Daily Session Note  Patient Details  Name: Erica [name] MRN: [id number] Date of Birth: 04-Aug-1955  Today's Date: 11/10/2020 SLP Individual Time: 9791-5041 SLP Individual Time Calculation (min): 45 min  Short Term Goals: Week 1: SLP Short Term Goal 1 (Week 1): Pt will increase recall of functional and novel information to 70% accuracy with mod A cues SLP Short Term Goal 2 (Week 1): Pt will complete mildly complex problem solving tasks with min A for error awareness and self monitoring. SLP Short Term Goal 3 (Week 1): Pt will complete money management tasks with min A verbal cues  Skilled Therapeutic Interventions: Skilled SLP intervention focused on cognition. Pt required max A with calendar organization task for processing written information and attention to details. She often wrote one word for each event that needed to be transferred to calender and needed cues and further explanation for reasoning why more information would be needed. For example, pt wrote "coming" for brother coming on the 20th for 4 days. She required visual cues to recognize errors in dates. Pt reports she used a calendar at home prior to hospitalization and was able to effectively use this for keeping track of events. Pt became nauseous at end of session. RN notified. She was given a cup of ice water and left lying in bed with call button within reach. Cont with therapy per plan of care.      Pain Pain Assessment Pain Scale: Faces Faces Pain Scale: No hurt  Therapy/Group: Individual Therapy  Darrol Poke Savion Washam 11/10/2020, 9:43 AM

## 2020-11-10 NOTE — Progress Notes (Signed)
Physical Therapy Session Note  Patient Details  Name: Erica [name] MRN: [id number] Date of Birth: 21-Mar-1956  Today's Date: 11/10/2020 PT Individual Time:Session1: 1005-1100; Session2: 1324-4010 PT Individual Time Calculation (min): 55 min & 35 min  Short Term Goals: Week 1:  PT Short Term Goal 1 (Week 1): STG = LTG based on ELOS  Skilled Therapeutic Interventions/Progress Updates:    Session1:  Patient in supine and reports has same sickness as yesterday, nausea and vomiting.  Reports did have medication.  Agreeable to try what she can.  Supine to sit with S elevated HOB.  BP measured seated and standing.  161/79 seated, 133/77 standing.  Patient min a to stand and ambulate 12' to sink to brush teeth with CGA.  Patient with ambulation noting L foot dragging while in the room.  Assisted in w/c to therapy gym.  Patient sit to stand CGA and ambulated x 70' with RW and CGA noting L foot picking up of the floor, but R knee buckling.  Patient in parallel bars performed step taps to 4" step with R foot for L stance with cues and assist for keeping knee straight. Orange t-band around back of knee in standing while pt performing step taps for tactile cue to keep knee straight.  Patient performed standing terminal knee extensions with orange band behind knee x 10.  Patient seated rest, then performed forward step ups to 4" step with L first, then down with R first x 4 reps with mild c/o L knee pain.  Patient stand step transfer to w/c and assisted in w/c to room.  Requesting to rest in bed so patient ambulated 10' around bed with RW and CGA.  Sit to supine with CGA.  Left with call bell and needs in reach and bed alarm set.    [place]:  Patient in supine and reports feeling better.  Supine to sit with S.  Patient ambulated with RW around bed to w/c.  Patient propelled w/c x 80' with min A to keep from veering L due to L UE weakness.  Requesting socks to cover hands due to severe cold sensation on L hand.   Patient assisted down to ortho gym.  Performed car transfer to simulated small SUV height (24") height with CGA using RW.  Patient negotiated ramp with RW and CGA.  Patient assisted in w/c to room and requesting to toilet.  Patient ambulated to bathroom with RW and completed toileting with distant S.  Patient ambulated to bed with RW and CGA.  Sit to supine with S.  Left with call bell in reach and bed alarm set.   Therapy Documentation Precautions:  Precautions Precautions: Fall Precaution Comments: mild L hemi Restrictions Weight Bearing Restrictions: No General:   Vital Signs: Therapy Vitals Pulse Rate: 81 Resp: 14 BP: 133/77 Patient Position (if appropriate): Standing Oxygen Therapy SpO2: 98 % O2 Device: Room Air Pain: Pain Assessment Pain Scale: Faces Pain Score: 0-No pain Faces Pain Scale: No hurt   Therapy/Group: Individual Therapy  Reginia Naas  [place], PT 11/10/2020, 10:22 AM

## 2020-11-10 NOTE — Progress Notes (Signed)
PROGRESS NOTE   Subjective/Complaints: Patient seen laying in bed this AM.  She states she slept well overnight.  She states she had a good weekend. She states therapies are going well.   ROS: Denies CP, SOB, N/V/D  Objective:   No results found. Recent Labs    11/09/20 0539 11/10/20 0552  WBC 2.9* 3.7*  HGB 9.7* 10.3*  HCT 27.5* 28.7*  PLT 134* 132*   Recent Labs    11/09/20 0539  NA 136  K 4.1  CL 109  CO2 21*  GLUCOSE 151*  BUN 20  CREATININE 1.78*  CALCIUM 9.0    Intake/Output Summary (Last 24 hours) at 11/10/2020 1045 Last data filed at 11/10/2020 0818 Gross per 24 hour  Intake 720 ml  Output --  Net 720 ml        Physical Exam: Vital Signs Blood pressure 133/77, pulse 81, temperature 98.2 F (36.8 C), temperature source Oral, resp. rate 14, weight 82.3 kg, SpO2 98 %. Constitutional: No distress . Vital signs reviewed. HENT: Normocephalic.  Atraumatic. Eyes: EOMI. No discharge. Cardiovascular: No JVD.  RRR. Respiratory: Normal effort.  No stridor.  Bilateral clear to auscultation. GI: Non-distended.  BS +. Skin: Warm and dry.  Intact. Psych: Normal mood.  Normal behavior. Musc: No edema in extremities.  No tenderness in extremities. Neurological: Alert Motor: RUE/RLE: 4+/5 proximal distal LUE: 4-4+/5 proximal distal LLE: Hip flexion, knee extension 4/5, ankle dorsiflexion 4+/5   Assessment/Plan: 1. Functional deficits which require 3+ hours per day of interdisciplinary therapy in a comprehensive inpatient rehab setting.  Physiatrist is providing close team supervision and 24 hour management of active medical problems listed below.  Physiatrist and rehab team continue to assess barriers to discharge/monitor patient progress toward functional and medical goals  Care Tool:  Bathing    Body parts bathed by patient: Right arm,Left arm,Chest,Abdomen,Front perineal area,Buttocks,Right upper  leg,Left upper leg,Right lower leg,Left lower leg,Face         Bathing assist Assist Level: Minimal Assistance - Patient > 75%     Upper Body Dressing/Undressing Upper body dressing   What is the patient wearing?: Pull over shirt    Upper body assist Assist Level: Set up assist    Lower Body Dressing/Undressing Lower body dressing      What is the patient wearing?: Pants,Incontinence brief     Lower body assist Assist for lower body dressing: Minimal Assistance - Patient > 75%     Toileting Toileting    Toileting assist Assist for toileting: Contact Guard/Touching assist     Transfers Chair/bed transfer  Transfers assist     Chair/bed transfer assist level: Minimal Assistance - Patient > 75%     Locomotion Ambulation   Ambulation assist      Assist level: Minimal Assistance - Patient > 75% Assistive device: Walker-rolling Max distance: 30'   Walk 10 feet activity   Assist     Assist level: Minimal Assistance - Patient > 75% Assistive device: Walker-rolling   Walk 50 feet activity   Assist    Assist level: Minimal Assistance - Patient > 75% Assistive device: Walker-rolling    Walk 150 feet activity  Assist Walk 150 feet activity did not occur: Safety/medical concerns         Walk 10 feet on uneven surface  activity   Assist Walk 10 feet on uneven surfaces activity did not occur: Safety/medical concerns         Wheelchair     Assist Will patient use wheelchair at discharge?: No             Wheelchair 50 feet with 2 turns activity    Assist            Wheelchair 150 feet activity     Assist          Medical Problem List and Plan: 1.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 Antithrombotics: -DVT/anticoagulation:Subcutaneous  heparin -antiplatelet therapy: Aspirin 81 mg daily and Plavix 75 mg dailyx 3 weeks then aspirin alone 3. Pain Management:Voltaren gel as needed Neurontin 300 mg twice daily as needed  -added tramadol 43m q8 prn for severe headache  Controlled on 5/31 4. Mood:Effexor 75 mg daily, BuSpar 10 mg twice daily, trazodone 50 mg nightly -antipsychotic agents: N/A 5. Neuropsych: This patientiscapable of making decisions on herown behalf. 6. Skin/Wound Care:Routine skin checks 7. Fluids/Electrolytes/Nutrition:Routine in and outs with follow-up chemistries 8. Hypertension. Cozaar 50 mg daily, Norvasc 5 mg daily Slightly labile on 5/31, monitor for trend 9. Idiopathic pulmonary fibrosis/COPD.Pirfenidone801 mg 3 times daily, continue inhalers  -lungs clear, symptoms controlled at present 10. Diabetes mellitus with hyperglycemia. Lantus insulin 25 units twice daily. Check blood sugars before meals and at bedtime  CBG (last 3)  Recent Labs    11/09/20 2012 11/10/20 0616 11/10/20 0641  GLUCAP 130* 66* 96    Bedtime snack ordered-discussed with nursing  Labile on 5/31 11. CKD stage III.   Creatinine 1.78 on 5/30  Continue to monitor 12. History of rectal cancer. Follow-up outpatient 13. Hyperlipidemia. Crestor 14. GERD. Pepcid 15. Nausea/vomiting  Added phenergan 12.5 mg q6 hours prn  Improved 16. Leukopenia  WBCs 3.7 on 5/31  Continue to monitor  LOS: 4 days A FACE TO FACE EVALUATION WAS PERFORMED  Chabely Norby ALorie Phenix5/31/2022, 10:45 AM

## 2020-11-10 NOTE — Progress Notes (Signed)
Inpatient Rehabilitation  Patient information reviewed and entered into eRehab system by [facility]. Loni Beckwith., CCC/SLP, PPS Coordinator.  Information including medical coding, functional ability and quality indicators will be reviewed and updated through discharge.

## 2020-11-11 DIAGNOSIS — I6389 Other cerebral infarction: Secondary | ICD-10-CM | POA: Diagnosis not present

## 2020-11-11 LAB — GLUCOSE, CAPILLARY
Glucose-Capillary: 74 mg/dL (ref 70–99)
Glucose-Capillary: 144 mg/dL — ABNORMAL HIGH (ref 70–99)
Glucose-Capillary: 180 mg/dL — ABNORMAL HIGH (ref 70–99)
Glucose-Capillary: 171 mg/dL — ABNORMAL HIGH (ref 70–99)

## 2020-11-11 MED ORDER — INSULIN GLARGINE 100 UNIT/ML ~~LOC~~ SOLN
20.0000 [IU] | Freq: Two times a day (BID) | SUBCUTANEOUS | Status: DC
  Administered 2020-11-11 – 2020-11-16 (×10): 20 [IU] via SUBCUTANEOUS
  Filled 2020-11-11 (×14): qty 0.2

## 2020-11-11 NOTE — Progress Notes (Signed)
Occupational Therapy Session Note  Patient Details  Name: Erica [name] MRN: [id number] Date of Birth: December 15, 1955  Today's Date: 11/11/2020 OT Individual Time: 1518-3437 OT Individual Time Calculation (min): 49 min    Short Term Goals: Week 1:  OT Short Term Goal 1 (Week 1): STGs equal to LTGs set at supervision to modified independent level.  Skilled Therapeutic Interventions/Progress Updates:    Pt received sitting EOB with SLP completing session; agreeable to OT, no pain reported but stated "I feel a little off/fatigued from the medications." Sit<>stand and ambulating bed<>w/c CGA with RW, decreased speed. Pt engaged in simple meal prep tasks in kitchen to simulate home environment and practice endurance, dynamic standing balance, energy conservation, and RW mangement/safety. Min vc's and education on RW management when reaching outside BOS. Pt stood to complete task with ambulation using RW for ~10 min before feeling fatigued and requesting seated rest break; pt reported "brain freeze" headache, nausea, and weak BLEs. Pt's vitals taken (see below) and WNL; provided ice water. Pt requested cessation of session due to symptoms. Sit>sup close spvsn using bed rails. Pt remained supine in bed, alarm set, call bell in reach, and RN made aware of pt's symptoms during activity.   Therapy Documentation Precautions:  Precautions Precautions: Fall Precaution Comments: mild L hemi Restrictions Weight Bearing Restrictions: No General: General OT Amount of Missed Time: 11 Minutes Vital Signs: Following activity - seated: BP 155/86, 100% O2 sat, HR 80; standing: 140/92 Pain: Pain Assessment Pain Scale: 0-10 Pain Score:  (unrated) Pain Type: Acute pain Pain Location: Head Pain Orientation: Right;Mid Pain Descriptors / Indicators: Headache Pain Onset: With Activity Pain Intervention(s): Rest;Relaxation;Distraction;RN made aware;Repositioned  Therapy/Group: Individual Therapy  Mellissa Kohut 11/11/2020, 10:14 AM

## 2020-11-11 NOTE — Progress Notes (Signed)
Speech Language Pathology Daily Session Note  Patient Details  Name: Erica [name] MRN: [id number] Date of Birth: 1955-09-20  Today's Date: 11/11/2020 SLP Individual Time: 0737-0800 SLP Individual Time Calculation (min): 23 min  Short Term Goals: Week 1: SLP Short Term Goal 1 (Week 1): Pt will increase recall of functional and novel information to 70% accuracy with mod A cues SLP Short Term Goal 2 (Week 1): Pt will complete mildly complex problem solving tasks with min A for error awareness and self monitoring. SLP Short Term Goal 3 (Week 1): Pt will complete money management tasks with min A verbal cues  Skilled Therapeutic Interventions: Skilled SLP intervention focused on cognition. Pt completed simple pill sorting task with TID pill organizer with mod -max A for understanding of pill placement for 2x/day and locating nighttime slots on pill organizer. Pt required cues to increase attention to details and for awareness of errors. Pt reported she has used a pill organizer prior to this hospitalization however needed cues for awareness of assistance needed to complete this task. Pt left seated at EOB with next therapist working with patient. Cont with therapy per plan of care.      Pain Pain Assessment Pain Scale: Faces Faces Pain Scale: No hurt  Therapy/Group: Individual Therapy  Erica [name] 11/11/2020, 3:36 PM

## 2020-11-11 NOTE — Progress Notes (Addendum)
PROGRESS NOTE   Subjective/Complaints: Patient seen laying in bed this AM.  She states she slept well overnight.  She has questions regarding headache-discussed with therapies as well.   ROS: Denies CP, SOB, N/V/D  Objective:   No results found. Recent Labs    11/09/20 0539 11/10/20 0552  WBC 2.9* 3.7*  HGB 9.7* 10.3*  HCT 27.5* 28.7*  PLT 134* 132*   Recent Labs    11/09/20 0539  NA 136  K 4.1  CL 109  CO2 21*  GLUCOSE 151*  BUN 20  CREATININE 1.78*  CALCIUM 9.0    Intake/Output Summary (Last 24 hours) at 11/11/2020 0906 Last data filed at 11/11/2020 0700 Gross per 24 hour  Intake 720 ml  Output --  Net 720 ml        Physical Exam: Vital Signs Blood pressure 133/80, pulse 60, temperature 97.7 F (36.5 C), temperature source Oral, resp. rate 16, weight 82.3 kg, SpO2 100 %. Constitutional: No distress . Vital signs reviewed. HENT: Normocephalic.  Atraumatic. Eyes: EOMI. No discharge. Cardiovascular: No JVD.  RRR. Respiratory: Normal effort.  No stridor.  Bilateral clear to auscultation. GI: Non-distended.  BS +. Skin: Warm and dry.  Intact. Psych: Normal mood.  Normal behavior. Musc: No edema in extremities.  No tenderness in extremities. Neurological: Alert Motor: RUE/RLE: 4+/5 proximal distal LUE: 4-4+/5 proximal distal LLE: Hip flexion, knee extension 4/5, ankle dorsiflexion 4+/5, unchanged  Assessment/Plan: 1. Functional deficits which require 3+ hours per day of interdisciplinary therapy in a comprehensive inpatient rehab setting.  Physiatrist is providing close team supervision and 24 hour management of active medical problems listed below.  Physiatrist and rehab team continue to assess barriers to discharge/monitor patient progress toward functional and medical goals  Care Tool:  Bathing    Body parts bathed by patient: Right arm,Left arm,Chest,Abdomen,Front perineal area,Buttocks,Right  upper leg,Left upper leg,Right lower leg,Left lower leg,Face         Bathing assist Assist Level: Supervision/Verbal cueing     Upper Body Dressing/Undressing Upper body dressing   What is the patient wearing?: Pull over shirt    Upper body assist Assist Level: Set up assist    Lower Body Dressing/Undressing Lower body dressing      What is the patient wearing?: Underwear/pull up,Pants     Lower body assist Assist for lower body dressing: Supervision/Verbal cueing     Toileting Toileting    Toileting assist Assist for toileting: Contact Guard/Touching assist     Transfers Chair/bed transfer  Transfers assist     Chair/bed transfer assist level: Contact Guard/Touching assist     Locomotion Ambulation   Ambulation assist      Assist level: Contact Guard/Touching assist Assistive device: Walker-rolling Max distance: 80'   Walk 10 feet activity   Assist     Assist level: Contact Guard/Touching assist Assistive device: Walker-rolling   Walk 50 feet activity   Assist    Assist level: Contact Guard/Touching assist Assistive device: Walker-rolling    Walk 150 feet activity   Assist Walk 150 feet activity did not occur: Safety/medical concerns         Walk 10 feet on uneven surface  activity   Assist Walk 10 feet on uneven surfaces activity did not occur: Safety/medical concerns         Wheelchair     Assist Will patient use wheelchair at discharge?: No Type of Wheelchair: Manual    Wheelchair assist level: Minimal Assistance - Patient > 75% Max wheelchair distance: 34'    Wheelchair 50 feet with 2 turns activity    Assist        Assist Level: Minimal Assistance - Patient > 75%   Wheelchair 150 feet activity     Assist          Medical Problem List and Plan: 1.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 Please see conference note from today as well.  2. Antithrombotics: -DVT/anticoagulation:Subcutaneous heparin -antiplatelet therapy: Aspirin 81 mg daily and Plavix 75 mg dailyx 3 weeks then aspirin alone 3. Pain Management:Voltaren gel as needed Neurontin 300 mg twice daily as needed  -added tramadol 32m q8 prn for severe headache  Controlled on 6/1 4. Mood:Effexor 75 mg daily, BuSpar 10 mg twice daily, trazodone 50 mg nightly -antipsychotic agents: N/A 5. Neuropsych: This patientiscapable of making decisions on herown behalf. 6. Skin/Wound Care:Routine skin checks 7. Fluids/Electrolytes/Nutrition:Routine in and outs with follow-up chemistries 8. Hypertension. Cozaar 50 mg daily, Norvasc 5 mg daily  Relatively controlled on 6/1 9. Idiopathic pulmonary fibrosis/COPD.Pirfenidone801 mg 3 times daily, continue inhalers  -lungs clear, symptoms controlled at present 10. Diabetes mellitus with hyperglycemia.   Lantus insulin 25 units twice daily, decreased to 20 BID on 6/1.   Check blood sugars before meals and at bedtime  CBG (last 3)  Recent Labs    11/10/20 1656 11/10/20 1955 11/11/20 0606  GLUCAP 138* 232* 74    Bedtime snack ordered-discussed with nursing  Very labile on 6/1, monitor trend 11. CKD stage III.   Creatinine 1.78 on 5/30, plan to order labs for the end of the week  Continue to monitor 12. History of rectal cancer. Follow-up outpatient 13. Hyperlipidemia. Crestor 14. GERD. Pepcid 15. Nausea/vomiting  Added phenergan 12.5 mg q6 hours prn  Improved 16. Leukopenia  WBCs 3.7 on 5/31  Continue to monitor  LOS: 5 days A FACE TO FACE EVALUATION WAS PERFORMED  Amala Petion ALorie Phenix6/06/2020, 9:06 AM

## 2020-11-11 NOTE — Progress Notes (Signed)
Patient ID: Erica [name], female   DOB: Sep 23, 1955, 65 y.o.   MRN: [id number]  Met with pt and spoke with sister-[name] to discuss team conference supervision level goals and target discharge 6/11. Sister will do what she can for pt but doesn't get along with her nephew due to living off of his Mom-pt. Will continue to work on discharge needs and provide support to pt.

## 2020-11-11 NOTE — Progress Notes (Signed)
Physical Therapy Session Note  Patient Details  Name: Erica [name] MRN: [id number] Date of Birth: Mar 24, 1956  Today's Date: 11/11/2020 PT Individual Time: 1420-1518 PT Individual Time Calculation (min): 58 min   Short Term Goals: Week 1:  PT Short Term Goal 1 (Week 1): STG = LTG based on ELOS  Skilled Therapeutic Interventions/Progress Updates:    PAtient in supine and performed supine to sit with S.  Patient ambulated with RW and min A x 50' limited by dizziness.  PAtient assisted in w/c to ortho gym.  Transferred to mat via stand pivot min A without RW.  Patient seated for vestibular assessment.  Noted difficulty with smooth pursuits demonstrating saccadic eye movement, then difficulty with maintaining target worse on R than L with some nystagmus noted (direction changing).  Patient able to perform VOR only for a few head turns and with max verbal cues and reports no worse symptoms with head nods versus head rotation.  Noted catch up saccades bilaterally with VOR cancellation.  Patient c/o feeling groggy from pain medication as well so further testing deferred.  Educated in plans for exercises for gaze stabilization and for habituation to treat symptoms.  Also education during ambulation for fixed visual target to help decrease symptoms and improve ambulation tolerance.  Patient ambulated x 100' with RW and min A mod cues for continuing as pt c/o fatigue.  Patient initiated Berg balance assessment as noted below.  C/o too fatigued to continue to complete assessment today.  Patient assisted in w/c to room and transferred to EOB with RW and CGA.  Left seated EOB with RN coming in the room.   Therapy Documentation Precautions:  Precautions Precautions: Fall Precaution Comments: mild L hemi Restrictions Weight Bearing Restrictions: No Vital Signs: Therapy Vitals Temp: 97.8 F (36.6 C) Temp Source: Oral Pulse Rate: 84 Resp: 17 BP: (!) 159/89 Patient Position (if appropriate): Sitting (after  ambulation) Oxygen Therapy SpO2: 100 % O2 Device: Room Air Patient Activity (if Appropriate): In chair (after ambulation) Pulse Oximetry Type: Intermittent Pain: Pain Assessment Pain Scale: 0-10 Pain Score:  (unrated) Pain Type: Acute pain Pain Location: Head Pain Orientation: Right;Mid Pain Descriptors / Indicators: Headache Pain Onset: With Activity Pain Intervention(s): Rest;Relaxation;Distraction;RN made aware;Repositioned   Therapy/Group: Individual Therapy  Reginia Naas  Magda Kiel, PT 11/11/2020, 12:39 PM

## 2020-11-11 NOTE — Patient Care Conference (Signed)
Inpatient RehabilitationTeam Conference and Plan of Care Update Date: 11/11/2020   Time: 11:47 AM    Patient Name: Erica [name]      Medical Record Number: [id number]  Date of Birth: 10-19-1955 Sex: Female         Room/Bed: 25C02C/5C02C-01 Payor Info: Payor: [employer] MEDICARE / Plan: [place] HMO / Product Type: *No Product type* /    Admit Date/Time:  11/06/2020  4:31 PM  Primary Diagnosis:  Parietal lobe infarction [facility])  Hospital Problems: Principal Problem:   Parietal lobe infarction [facility]) Active Problems:   Leukopenia   Controlled type 2 diabetes mellitus with hyperglycemia, with long-term current use of insulin [facility])   Vascular headache    Expected Discharge Date: Expected Discharge Date: 11/21/20  Team Members Present: Physician leading conference: Dr. Delice Lesch Care Coodinator Present: Dorien Chihuahua, RN, BSN, CRRN;Becky Dupree, LCSW Nurse Present: Dorien Chihuahua, RN PT Present: Magda Kiel, PT OT Present: Other (comment) Hulda Marin, OT) PPS Coordinator present : Gunnar Fusi, SLP     Current Status/Progress Goal Weekly Team Focus  Bowel/Bladder   Pt is cont of B/B. LBM 11/09/20  Pt to remain cont of B/B.  Toliet q2h or PRN   Swallow/Nutrition/ Hydration             ADL's   close spvsn-CGA transfers and dynamic standing balance, UB/LB adls close spvsn, limited endurance  mod I-set up A  adl/transfer training, general conditioning and endurance, standing/ambulation tolerance in functional tasks, cog   Mobility   CGA for transfers, gait up to 80' w/ RW, 1 step with rails and min A., L knee weakness  S overall  L knee strength, balance, activity tolerance, safety   Communication             Safety/Cognition/ Behavioral Observations  Mod A  Supervision  awareness of errors, awareness of cognitive deficits, problem solving with med management and finance tasks.   Pain   Pt has no complaints of pain  Pt remain pain free  Assess for pain qshift and PRN    Skin   Pt skin is clean and intact  Pt skin remain clean and free of breakdown  Assess skin qshift and provide skin care     Discharge Planning:  HOme with son who lives with her but not dependable-relise upon her sister to come by daily and assist. Was recently her in 06/2020   Team Discussion: MD monitoring labs; decreased lantus dose. Progress limited by left knee buckling, easily fatigued and por endurance.  Patient on target to meet rehab goals: yes, currently CGA for transfers and gait and 1 step. Close supervision for ADLs. Ambulated in kitchen for simple meal prep . Discharge goals set for supervision - MOD I assist.  *See Care Plan and progress notes for long and short-term goals.   Revisions to Treatment Plan:   Teaching Needs: Transfers, toileting, medication management, secondary stroke risk management, etc.   Current Barriers to Discharge: Home enviroment access/layout and Lack of/limited family support  Possible Resolutions to Barriers: HH follow up services     Medical Summary Current Status: Left-sided hemiparesis secondary to punctate parietal occipital infarction in the setting of prior right thalamic caudate infarction  Barriers to Discharge: Medical stability;Decreased family/caregiver support  Barriers to Discharge Comments: Endurance Possible Resolutions to [employer] Focus: Therapies, optimize BP/DM meds, follow labs - Cr, WBCs   Continued Need for Acute Rehabilitation Level of Care: The patient requires daily medical management by a physician  with specialized training in physical medicine and rehabilitation for the following reasons: Direction of a multidisciplinary physical rehabilitation program to maximize functional independence : Yes Medical management of patient stability for increased activity during participation in an intensive rehabilitation regime.: Yes Analysis of laboratory values and/or radiology reports with any subsequent need for  medication adjustment and/or medical intervention. : Yes   I attest that I was present, lead the team conference, and concur with the assessment and plan of the team.   Dorien Chihuahua B 11/11/2020, 4:04 PM

## 2020-11-12 ENCOUNTER — Encounter: Payer: Medicare HMO | Admitting: Physical Medicine & Rehabilitation

## 2020-11-12 DIAGNOSIS — E1165 Type 2 diabetes mellitus with hyperglycemia: Secondary | ICD-10-CM | POA: Diagnosis not present

## 2020-11-12 DIAGNOSIS — D72819 Decreased white blood cell count, unspecified: Secondary | ICD-10-CM | POA: Diagnosis not present

## 2020-11-12 DIAGNOSIS — N1832 Chronic kidney disease, stage 3b: Secondary | ICD-10-CM | POA: Diagnosis not present

## 2020-11-12 DIAGNOSIS — I6389 Other cerebral infarction: Secondary | ICD-10-CM | POA: Diagnosis not present

## 2020-11-12 LAB — GLUCOSE, CAPILLARY
Glucose-Capillary: 72 mg/dL (ref 70–99)
Glucose-Capillary: 67 mg/dL — ABNORMAL LOW (ref 70–99)
Glucose-Capillary: 88 mg/dL (ref 70–99)
Glucose-Capillary: 149 mg/dL — ABNORMAL HIGH (ref 70–99)
Glucose-Capillary: 199 mg/dL — ABNORMAL HIGH (ref 70–99)

## 2020-11-12 MED ORDER — PNEUMOCOCCAL VAC POLYVALENT 25 MCG/0.5ML IJ INJ
0.5000 mL | INJECTION | INTRAMUSCULAR | Status: DC
  Filled 2020-11-12 (×2): qty 0.5

## 2020-11-12 NOTE — Progress Notes (Signed)
Speech Language Pathology Daily Session Note  Patient Details  Name: ADIN LARICCIA MRN: [id number] Date of Birth: 1955-08-07  Today's Date: 11/12/2020 SLP Individual Time: 0830-0900 SLP Individual Time Calculation (min): 30 min  Short Term Goals: Week 1: SLP Short Term Goal 1 (Week 1): Pt will increase recall of functional and novel information to 70% accuracy with mod A cues SLP Short Term Goal 2 (Week 1): Pt will complete mildly complex problem solving tasks with min A for error awareness and self monitoring. SLP Short Term Goal 3 (Week 1): Pt will complete money management tasks with min A verbal cues  Skilled Therapeutic Interventions: Skilled SLP intervention focused on cognition. Pt very sleepy during session often dozing off during tasks. She was able to recall this therapist and purpose of slp tx. She frequently required information to be repeated and increased time for responses. She completed mental time calculations with mod A verbal cues. Pt able to recall name of medication she receives in the morning that makes her sleepy (tramadol). Session ended 30 min early due to patient falling asleep. Cont with therapy per plan of care.      Pain Pain Assessment Pain Scale: Faces Pain Score: 0-No pain Faces Pain Scale: No hurt  Therapy/Group: Individual Therapy  Darrol Poke Joshua [name] 11/12/2020, 1:45 PM

## 2020-11-12 NOTE — Progress Notes (Signed)
Physical Therapy Session Note  Patient Details  Name: Erica [name] MRN: [id number] Date of Birth: May 31, 1956  Today's Date: 11/12/2020 PT Individual Time: 1530-1545 PT Individual Time Calculation (min): 15 min   Short Term Goals: Week 1:  PT Short Term Goal 1 (Week 1): STG = LTG based on ELOS  Skilled Therapeutic Interventions/Progress Updates:   Pt received supine in bed and agreeable to PT. Supine>sit transfer with CGA. Pt reports feeling extremely weak and nauseous. Of note, pt has had blood glucose <100 throughout the morning with most recent check at 88. Per pt report this is very low for her, and she wants to rest this afternoon.  Pt returned Sit>supine completed with supervision assist, and left supine in bed with call bell in reach and all needs met.       Therapy Documentation Precautions:  Precautions Precautions: Fall Precaution Comments: mild L hemi Restrictions Weight Bearing Restrictions: No General: PT Amount of Missed Time (min): 15 Minutes PT Missed Treatment Reason: Patient fatigue;Other (Comment) Low blood glucose   Pain: Pain Assessment Pain Scale: 0-10 Pain Score: 8  Faces Pain Scale: No hurt Pain Type: Acute pain Pain Location: Head Pain Orientation: Right Pain Intervention(s): Medication (See eMAR)   Therapy/Group: Individual Therapy  Lorie Phenix 11/12/2020, 6:19 PM

## 2020-11-12 NOTE — Progress Notes (Signed)
Hypoglycemic Event  CBG: 67  Treatment: Gave 8 Oz of orange juice , Rechecked Blood sugar 31mn after  Symptoms: None  Follow-up CBG: Time: 11:40 CBG Result: 88  Possible Reasons for Event: Unknown   Comments/MD notified:    NArn MedalLPN

## 2020-11-12 NOTE — Evaluation (Signed)
Recreational Therapy Assessment and Plan  Patient Details  Name: Erica [name] MRN: [id number] Date of Birth: Nov 03, 1955 Today's Date: 11/12/2020  Rehab Potential:  Good ELOS:   d/c/6/11  Assessment   Hospital Problem: Principal Problem:   Parietal lobe infarction [facility])   Past Medical History:      Past Medical History:  Diagnosis Date  . Arthritis    especially in shoulders  . Asthma   . Depression   . Diabetes mellitus   . GERD (gastroesophageal reflux disease)   . Headache(784.0)    "mild"  . Hypertension   . Mental disorder    depression  . Neuropathy    feet   . Pain    arthritis pain - takes tramadol as needed  . Rectal polyp    very little bleeding with bowel movements- no pain  . Suicide attempt [facility])    Past Surgical History:       Past Surgical History:  Procedure Laterality Date  . ANAL RECTAL MANOMETRY N/A 07/13/2016   Procedure: ANO RECTAL MANOMETRY;  Surgeon: Leighton Ruff, MD;  Location: [facility] ENDOSCOPY;  Service: Endoscopy;  Laterality: N/A;  . CESAREAN SECTION    . EUS N/A 11/21/2012   Procedure: LOWER ENDOSCOPIC ULTRASOUND (EUS);  Surgeon: Arta Silence, MD;  Location: [facility];  Service: Endoscopy;  Laterality: N/A;  . FLEXIBLE SIGMOIDOSCOPY N/A 11/21/2012   Procedure: FLEXIBLE SIGMOIDOSCOPY;  Surgeon: Arta Silence, MD;  Location: [facility] ENDOSCOPY;  Service: Endoscopy;  Laterality: N/A;  . FLEXIBLE SIGMOIDOSCOPY N/A 01/28/2013   Procedure: FLEXIBLE SIGMOIDOSCOPY;  Surgeon: Leighton Ruff, MD;  Location: [facility] ENDOSCOPY;  Service: Endoscopy;  Laterality: N/A;  . LAPAROSCOPIC LOW ANTERIOR RESECTION N/A 01/29/2013   Procedure: LAPAROSCOPIC LOW ANTERIOR RESECTION, Rigid Proctoscopy;  Surgeon: Leighton Ruff, MD;  Location: [facility] ORS;  Service: General;  Laterality: N/A;  . LAPAROSCOPIC SIGMOID COLECTOMY N/A 11/14/2012   Procedure: DIAGNOSTIC LAPAROSCOPY AND SIGMOIDMOIDOSCOPY ;  Surgeon: Rolm Bookbinder, MD;  Location: [facility] ORS;  Service:  General;  Laterality: N/A;  . RECTAL ULTRASOUND N/A 07/13/2016   Procedure: RECTAL ULTRASOUND;  Surgeon: Leighton Ruff, MD;  Location: [facility] ENDOSCOPY;  Service: Endoscopy;  Laterality: N/A;  . TONSILLECTOMY    . TONSILLECTOMY AND ADENOIDECTOMY      Assessment & Plan Clinical Impression: Patient is a 65 y.o. 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 Per chart review lives with her children. Independent with assistive device. 1 level home with level entry. Good supportive family. Presented 11/03/2020 with persistent headache and left-sided weakness x3 days. MRI showed punctate acute infarction at the medial right parieto-occipital junction. Chronic small vessel ischemic changes. MRA of the head negative for large vessel occlusion. MRI cervical spine with persistent patchy signal abnormality involving the central dorsal aspect of the cervical spinal cord extending from C2-C7-T1 relatively stable and unchanged from previous tracings. Admission chemistries unremarkable except chloride 113 creatinine 1.70 hemoglobin 9.2 hemoglobin A1c 6.3. Echocardiogram with ejection fraction of 60 to 85% grade 1 diastolic dysfunction. Currently maintained on aspirin and Plavix for CVA prophylaxis x3 weeks and aspirin alone. Subcutaneous heparin for DVT prophylaxis. Awaiting plan for loop recorder. Therapy evaluations completed due to patient's left-sided weakness was admitted for a comprehensive rehab program. Pt presents with decreased activity tolerance, decreased functional mobility, decreased balance, decreased attention, decreased awareness, decreased problem solving, decreased safety awareness, decreased memory and  delayed processing Limiting pt's independence with leisure/community pursuits.  Met with pt today to  discuss leisure interests, activity analysis/modificaitons and coping strategies.    Plan  Min 1 session >20 minutes per week during LOS  Recommendations for other services: None   Discharge Criteria: Patient will be discharged from TR if patient refuses treatment 3 consecutive times without medical reason.  If treatment goals not met, if there is a change in medical status, if patient makes no progress towards goals or if patient is discharged from hospital.  The above assessment, treatment plan, treatment alternatives and goals were discussed and mutually agreed upon: by patient  Sour John 11/12/2020, 11:29 AM

## 2020-11-12 NOTE — Progress Notes (Signed)
Physical Therapy Session Note  Patient Details  Name: ARACELIA BRINSON MRN: [id number] Date of Birth: [birth year]  Today's Date: 11/12/2020 PT Individual Time: 1405-1420 PT Individual Time Calculation (min): 15 min   Short Term Goals: Week 1:  PT Short Term Goal 1 (Week 1): STG = LTG based on ELOS  Skilled Therapeutic Interventions/Progress Updates:    PAtient in process of getting up with NT to go to bathroom.  Seated EOB and performed sit to stand with CGA.  Ambulated to bathroom with CGA.  Noted L leg dragging at times and hitting first with lateral aspect of foot.  Patient toileted with distant S.  Ambulated 10' back to bed with RW and CGA and sat EOB.  Sit to supine with S.  Reports feeling too wiped out to try anything else.  Checked back after 20 minutes per pt request and she continues to report unable to continue for today as too fatigued.  RN made aware.  Patient assisted for repositioning in bed, left with call bell in reach and bed alarm set.   Therapy Documentation Precautions:  Precautions Precautions: Fall Precaution Comments: mild L hemi Restrictions Weight Bearing Restrictions: No General: PT Amount of Missed Time (min): 60 Minutes PT Missed Treatment Reason: Patient fatigue;Other (Comment) (low CBG earlier (63, now 88)) Pain: Pain Assessment Pain Scale: 0-10 Pain Score: 8  Faces Pain Scale: No hurt Pain Type: Acute pain Pain Location: Head Pain Orientation: Right Pain Intervention(s): Medication (See eMAR)   Therapy/Group: Individual Therapy  Reginia Naas  [place], [place] 11/12/2020, 3:07 PM

## 2020-11-12 NOTE — Progress Notes (Signed)
PROGRESS NOTE   Subjective/Complaints: Patient seen sitting up in bed this morning.  She states she slept well overnight.  She states she has not had a bowel movement in a few days, but does not want any medications.  ROS: Denies CP, SOB, N/V/D  Objective:   No results found. Recent Labs    11/10/20 0552  WBC 3.7*  HGB 10.3*  HCT 28.7*  PLT 132*   No results for input(s): NA, K, CL, CO2, GLUCOSE, BUN, CREATININE, CALCIUM in the last 72 hours.  Intake/Output Summary (Last 24 hours) at 11/12/2020 1250 Last data filed at 11/12/2020 0700 Gross per 24 hour  Intake 600 ml  Output --  Net 600 ml        Physical Exam: Vital Signs Blood pressure (!) 160/82, pulse 61, temperature 97.8 F (36.6 C), temperature source Oral, resp. rate 16, weight 82.3 kg, SpO2 100 %. Constitutional: No distress . Vital signs reviewed. HENT: Normocephalic.  Atraumatic. Eyes: EOMI. No discharge. Cardiovascular: No JVD.  RRR. Respiratory: Normal effort.  No stridor.  Bilateral clear to auscultation. GI: Non-distended.  BS +. Skin: Warm and dry.  Intact. Psych: Normal mood.  Normal behavior. Musc: No edema in extremities.  No tenderness in extremities. Neurological: Alert Motor: RUE/RLE: 4+/5 proximal distal LUE: 4-4+/5 proximal distal, stable LLE: Hip flexion, knee extension 4/5, ankle dorsiflexion 4+/5, unchanged  Assessment/Plan: 1. Functional deficits which require 3+ hours per day of interdisciplinary therapy in a comprehensive inpatient rehab setting.  Physiatrist is providing close team supervision and 24 hour management of active medical problems listed below.  Physiatrist and rehab team continue to assess barriers to discharge/monitor patient progress toward functional and medical goals  Care Tool:  Bathing    Body parts bathed by patient: Right arm,Left arm,Chest,Abdomen,Front perineal area,Buttocks         Bathing assist  Assist Level: Supervision/Verbal cueing     Upper Body Dressing/Undressing Upper body dressing   What is the patient wearing?: Pull over shirt    Upper body assist Assist Level: Set up assist    Lower Body Dressing/Undressing Lower body dressing      What is the patient wearing?: Underwear/pull up,Pants     Lower body assist Assist for lower body dressing: Supervision/Verbal cueing     Toileting Toileting    Toileting assist Assist for toileting: Contact Guard/Touching assist     Transfers Chair/bed transfer  Transfers assist     Chair/bed transfer assist level: Contact Guard/Touching assist     Locomotion Ambulation   Ambulation assist      Assist level: Contact Guard/Touching assist Assistive device: Walker-rolling Max distance: 80'   Walk 10 feet activity   Assist     Assist level: Contact Guard/Touching assist Assistive device: Walker-rolling   Walk 50 feet activity   Assist    Assist level: Contact Guard/Touching assist Assistive device: Walker-rolling    Walk 150 feet activity   Assist Walk 150 feet activity did not occur: Safety/medical concerns         Walk 10 feet on uneven surface  activity   Assist Walk 10 feet on uneven surfaces activity did not occur: Safety/medical concerns  Wheelchair     Assist Will patient use wheelchair at discharge?: No Type of Wheelchair: Manual    Wheelchair assist level: Minimal Assistance - Patient > 75% Max wheelchair distance: 55'    Wheelchair 50 feet with 2 turns activity    Assist        Assist Level: Minimal Assistance - Patient > 75%   Wheelchair 150 feet activity     Assist          Medical Problem List and Plan: 1.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  Antithrombotics: -DVT/anticoagulation:Subcutaneous heparin -antiplatelet therapy: Aspirin 81 mg daily and Plavix 75 mg dailyx 3 weeks then aspirin alone 3. Pain Management:Voltaren gel as needed Neurontin 300 mg twice daily as needed  -added tramadol 37m q8 prn for severe headache  Controlled on 6/2 4. Mood:Effexor 75 mg daily, BuSpar 10 mg twice daily, trazodone 50 mg nightly -antipsychotic agents: N/A 5. Neuropsych: This patientiscapable of making decisions on herown behalf. 6. Skin/Wound Care:Routine skin checks 7. Fluids/Electrolytes/Nutrition:Routine in and outs with follow-up chemistries 8. Hypertension. Cozaar 50 mg daily, Norvasc 5 mg daily  ?  Trending up on 6/2, will increase medications if persistent 9. Idiopathic pulmonary fibrosis/COPD.Pirfenidone801 mg 3 times daily, continue inhalers  -lungs clear, symptoms controlled at present 10. Diabetes mellitus with hyperglycemia.   Lantus insulin 25 units twice daily, decreased to 20 BID on 6/1.   Check blood sugars before meals and at bedtime  CBG (last 3)  Recent Labs    11/12/20 0629 11/12/20 1126 11/12/20 1143  GLUCAP 72 67* 88    Bedtime snack ordered-discussed with nursing  Improving on 6/2 11. CKD stage III.   Creatinine 1.78 on 5/30, labs ordered for tomorrow  Continue to monitor 12. History of rectal cancer. Follow-up outpatient 13. Hyperlipidemia. Crestor 14. GERD. Pepcid 15. Nausea/vomiting  Added phenergan 12.5 mg q6 hours prn  Improved 16. Leukopenia  WBCs 3.7 on 5/31, labs ordered for tomorrow  Continue to monitor  LOS: 6 days A FACE TO FACE EVALUATION WAS PERFORMED  Erica [name] ALorie Phenix6/07/2020, 12:50 PM

## 2020-11-12 NOTE — Progress Notes (Signed)
Occupational Therapy Session Note  Patient Details  Name: Erica [name] MRN: [id number] Date of Birth: [birth year]  Today's Date: 11/12/2020 OT Individual Time: 1100-1155 OT Individual Time Calculation (min): 55 min    Short Term Goals: Week 1:  OT Short Term Goal 1 (Week 1): STGs equal to LTGs set at supervision to modified independent level.  Skilled Therapeutic Interventions/Progress Updates:    Pt received supine in bed, agreeable to OT, and no pain reported but pt expressing increased fatigue. Sup<>sitting EOB spvsn with bed rails (discussed pt does not have bed rail at home and will need to simulate home set up in future). Pt engaged in UB/LB bathing and dressing seated EOB as pt declined full shower. UB bathing/dressing set up A. LB bathing/dressing close spvsn when in stance. Vc's required for safety as pt attempted unthreading underwear from feet in stance with RW, completed sitting. NT checked pt's blood glucose which was low; pt provided orange juice with improved blood glucose later during session. Pt ambulated with RW with close spvsn to sink and completed grooming task in stance ~5 min before reporting too fatigued and requesting to lay down. Pt remained supine in bed, call bell in reach, and all needs met.   Therapy Documentation Precautions:  Precautions Precautions: Fall Precaution Comments: mild L hemi Restrictions Weight Bearing Restrictions: No  Pain: Pain Assessment Pain Scale: 0-10 Pain Score: 0-No pain   Therapy/Group: Individual Therapy  Mellissa Kohut 11/12/2020, 12:27 PM

## 2020-11-13 DIAGNOSIS — D72819 Decreased white blood cell count, unspecified: Secondary | ICD-10-CM | POA: Diagnosis not present

## 2020-11-13 DIAGNOSIS — I6389 Other cerebral infarction: Secondary | ICD-10-CM | POA: Diagnosis not present

## 2020-11-13 DIAGNOSIS — D649 Anemia, unspecified: Secondary | ICD-10-CM

## 2020-11-13 DIAGNOSIS — K219 Gastro-esophageal reflux disease without esophagitis: Secondary | ICD-10-CM

## 2020-11-13 DIAGNOSIS — E1165 Type 2 diabetes mellitus with hyperglycemia: Secondary | ICD-10-CM | POA: Diagnosis not present

## 2020-11-13 DIAGNOSIS — G441 Vascular headache, not elsewhere classified: Secondary | ICD-10-CM | POA: Diagnosis not present

## 2020-11-13 LAB — CBC WITH DIFFERENTIAL/PLATELET
Abs Immature Granulocytes: 0.02 10*3/uL (ref 0.00–0.07)
Basophils Absolute: 0 10*3/uL (ref 0.0–0.1)
Basophils Relative: 0 %
Eosinophils Absolute: 0.1 10*3/uL (ref 0.0–0.5)
Eosinophils Relative: 2 %
HCT: 28.8 % — ABNORMAL LOW (ref 36.0–46.0)
Hemoglobin: 10.3 g/dL — ABNORMAL LOW (ref 12.0–15.0)
Immature Granulocytes: 1 %
Lymphocytes Relative: 28 %
Lymphs Abs: 0.9 10*3/uL (ref 0.7–4.0)
MCH: 32.6 pg (ref 26.0–34.0)
MCHC: 35.8 g/dL (ref 30.0–36.0)
MCV: 91.1 fL (ref 80.0–100.0)
Monocytes Absolute: 0.3 10*3/uL (ref 0.1–1.0)
Monocytes Relative: 10 %
Neutro Abs: 1.8 10*3/uL (ref 1.7–7.7)
Neutrophils Relative %: 59 %
Platelets: 146 10*3/uL — ABNORMAL LOW (ref 150–400)
RBC: 3.16 MIL/uL — ABNORMAL LOW (ref 3.87–5.11)
RDW: 16.3 % — ABNORMAL HIGH (ref 11.5–15.5)
WBC: 3.1 10*3/uL — ABNORMAL LOW (ref 4.0–10.5)
nRBC: 0 % (ref 0.0–0.2)

## 2020-11-13 LAB — GLUCOSE, CAPILLARY
Glucose-Capillary: 133 mg/dL — ABNORMAL HIGH (ref 70–99)
Glucose-Capillary: 138 mg/dL — ABNORMAL HIGH (ref 70–99)
Glucose-Capillary: 117 mg/dL — ABNORMAL HIGH (ref 70–99)
Glucose-Capillary: 258 mg/dL — ABNORMAL HIGH (ref 70–99)

## 2020-11-13 LAB — BASIC METABOLIC PANEL
Anion gap: 7 (ref 5–15)
BUN: 27 mg/dL — ABNORMAL HIGH (ref 8–23)
CO2: 21 mmol/L — ABNORMAL LOW (ref 22–32)
Calcium: 8.9 mg/dL (ref 8.9–10.3)
Chloride: 108 mmol/L (ref 98–111)
Creatinine, Ser: 1.73 mg/dL — ABNORMAL HIGH (ref 0.44–1.00)
GFR, Estimated: 32 mL/min — ABNORMAL LOW (ref >60)
Glucose, Bld: 150 mg/dL — ABNORMAL HIGH (ref 70–99)
Potassium: 4.5 mmol/L (ref 3.5–5.1)
Sodium: 136 mmol/L (ref 135–145)

## 2020-11-13 MED ORDER — AMLODIPINE BESYLATE 5 MG PO TABS
7.5000 mg | ORAL_TABLET | Freq: Every day | ORAL | Status: DC
  Administered 2020-11-14 – 2020-11-17 (×4): 7.5 mg via ORAL
  Filled 2020-11-13 (×4): qty 1

## 2020-11-13 MED ORDER — AMLODIPINE BESYLATE 2.5 MG PO TABS
2.5000 mg | ORAL_TABLET | Freq: Once | ORAL | Status: AC
  Administered 2020-11-13: 2.5 mg via ORAL
  Filled 2020-11-13: qty 1

## 2020-11-13 MED ORDER — PANTOPRAZOLE SODIUM 20 MG PO TBEC
20.0000 mg | DELAYED_RELEASE_TABLET | Freq: Every day | ORAL | Status: DC
  Administered 2020-11-13 – 2020-11-21 (×9): 20 mg via ORAL
  Filled 2020-11-13 (×9): qty 1

## 2020-11-13 NOTE — Progress Notes (Signed)
Occupational Therapy Session Note  Patient Details  Name: Erica [name] MRN: [id number] Date of Birth: [birth year]  Today's Date: 11/13/2020 OT Individual Time: 0901-1001 OT Individual Time Calculation (min): 60 min    Short Term Goals: Week 1:  OT Short Term Goal 1 (Week 1): STGs equal to LTGs set at supervision to modified independent level.  Skilled Therapeutic Interventions/Progress Updates:    Pt received supine in bed, agreeable to OT session, reporting no pain and feeling better than yesterday. Sup<>sit close spvsn using bed rails. Sit<>stand with RW close spvsn. Pt ambulated with RW bed<>bathroom with close spvsn, noted decreased speed and L foot dragging in steps. Pt completed 3/3 toileting with distant spvsn seated, close spvsn in stance. Vc's required for safety in LB dressing as pt attempted doffing pants/underwear off feet while in stance with RW; pt demo'd good understanding to do seated, but needs reinforcement. Min vc's during transfer from [facility] to TTB to turn completely around prior to sitting on TTB. Pt doffed nonskid socks and ted hose with close spvsn using figure 4 technique. Pt showered with close spvsn seated and in stance using grab bars. UB dressing seated EOB set up A, LB dressing seated and in stance close spvsn. Pt ambulated with RW to sink and completed oral hygiene standing ~3 min before feeling fatigued and requiring seated rest break. Pt requested trip to vending machine for snack; pt able to demonstrate good skills for community reintegration by completing task independently from w/c level. Pt remained supine in bed, alarm set, call bell in reach, and all needs met.  Therapy Documentation Precautions:  Precautions Precautions: Fall Precaution Comments: mild L hemi Restrictions Weight Bearing Restrictions: No Pain: Pain Assessment Pain Scale: 0-10 Pain Score: 0-No pain   Therapy/Group: Individual Therapy  Mellissa Kohut 11/13/2020, 11:06 AM

## 2020-11-13 NOTE — Progress Notes (Signed)
PROGRESS NOTE   Subjective/Complaints: Patient seen sitting up in bed this Am.  She states she slept well overnight.  She notes she has heartburn, discussed with nursing as well.  She states she took Protonix at home.  ROS: + Heartburn.  Denies CP, SOB, N/V/D  Objective:   No results found. Recent Labs    11/13/20 0458  WBC 3.1*  HGB 10.3*  HCT 28.8*  PLT 146*   Recent Labs    11/13/20 0458  NA 136  K 4.5  CL 108  CO2 21*  GLUCOSE 150*  BUN 27*  CREATININE 1.73*  CALCIUM 8.9    Intake/Output Summary (Last 24 hours) at 11/13/2020 1119 Last data filed at 11/13/2020 0724 Gross per 24 hour  Intake 717 ml  Output --  Net 717 ml        Physical Exam: Vital Signs Blood pressure (!) 162/89, pulse 66, temperature 98.2 F (36.8 C), temperature source Oral, resp. rate 17, weight 82.3 kg, SpO2 100 %. Constitutional: No distress . Vital signs reviewed. HENT: Normocephalic.  Atraumatic. Eyes: EOMI. No discharge. Cardiovascular: No JVD.  RRR. Respiratory: Normal effort.  No stridor.  Bilateral clear to auscultation. GI: Non-distended.  BS +. Skin: Warm and dry.  Intact. Psych: Normal mood.  Normal behavior. Musc: No edema in extremities.  No tenderness in extremities. Neurological: Alert Motor: RUE/RLE: 4+/5 proximal distal LUE: 4-4+/5 proximal distal, stable LLE: Hip flexion, knee extension 4/5, ankle dorsiflexion 4+/5, stable  Assessment/Plan: 1. Functional deficits which require 3+ hours per day of interdisciplinary therapy in a comprehensive inpatient rehab setting.  Physiatrist is providing close team supervision and 24 hour management of active medical problems listed below.  Physiatrist and rehab team continue to assess barriers to discharge/monitor patient progress toward functional and medical goals  Care Tool:  Bathing    Body parts bathed by patient: Right arm,Left arm,Chest,Abdomen,Front perineal  area,Buttocks,Right upper leg,Left upper leg,Right lower leg,Left lower leg,Face         Bathing assist Assist Level: Supervision/Verbal cueing     Upper Body Dressing/Undressing Upper body dressing   What is the patient wearing?: Pull over shirt    Upper body assist Assist Level: Set up assist    Lower Body Dressing/Undressing Lower body dressing      What is the patient wearing?: Underwear/pull up,Pants     Lower body assist Assist for lower body dressing: Supervision/Verbal cueing     Toileting Toileting    Toileting assist Assist for toileting: Supervision/Verbal cueing     Transfers Chair/bed transfer  Transfers assist     Chair/bed transfer assist level: Supervision/Verbal cueing     Locomotion Ambulation   Ambulation assist      Assist level: Contact Guard/Touching assist Assistive device: Walker-rolling Max distance: 80'   Walk 10 feet activity   Assist     Assist level: Contact Guard/Touching assist Assistive device: Walker-rolling   Walk 50 feet activity   Assist    Assist level: Contact Guard/Touching assist Assistive device: Walker-rolling    Walk 150 feet activity   Assist Walk 150 feet activity did not occur: Safety/medical concerns  Walk 10 feet on uneven surface  activity   Assist Walk 10 feet on uneven surfaces activity did not occur: Safety/medical concerns         Wheelchair     Assist Will patient use wheelchair at discharge?: No Type of Wheelchair: Manual    Wheelchair assist level: Minimal Assistance - Patient > 75% Max wheelchair distance: 48'    Wheelchair 50 feet with 2 turns activity    Assist        Assist Level: Minimal Assistance - Patient > 75%   Wheelchair 150 feet activity     Assist          Medical Problem List and Plan: 1.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 Antithrombotics: -DVT/anticoagulation:Subcutaneous heparin -antiplatelet therapy: Aspirin 81 mg daily and Plavix 75 mg dailyx 3 weeks then aspirin alone 3. Pain Management:Voltaren gel as needed Neurontin 300 mg twice daily as needed  -added tramadol 86m q8 prn for severe headache  Controlled on 6/3 4. Mood:Effexor 75 mg daily, BuSpar 10 mg twice daily, trazodone 50 mg nightly -antipsychotic agents: N/A 5. Neuropsych: This patientiscapable of making decisions on herown behalf. 6. Skin/Wound Care:Routine skin checks 7. Fluids/Electrolytes/Nutrition:Routine in and outs with follow-up chemistries 8. Hypertension. Cozaar 50 mg daily  Norvasc 5 mg daily increase to 7.5 on 6/3 9. Idiopathic pulmonary fibrosis/COPD.Pirfenidone801 mg 3 times daily, continue inhalers  -lungs clear, symptoms controlled at present 10. Diabetes mellitus with hyperglycemia.   Lantus insulin 25 units twice daily, decreased to 20 BID on 6/1.   Check blood sugars before meals and at bedtime  CBG (last 3)  Recent Labs    11/12/20 1629 11/12/20 2115 11/13/20 0604  GLUCAP 149* 199* 133*    Bedtime snack ordered-discussed with nursing  Labile on 6/30, will likely require NovoLog however will not make any changes today given recent adjustment 11. CKD stage III.   Creatinine 1.73 on 6/3, labs ordered for Monday  Encourage fluids  Continue to monitor 12. History of rectal cancer. Follow-up outpatient 13. Hyperlipidemia. Crestor 14. GERD. Pepcid 15. Nausea/vomiting  Added phenergan 12.5 mg q6 hours prn  Improved 16.  Pancytopenia  WBCs 3.1 on 6/3, labs ordered for Monday  Hemoglobin 10.3 on 6/3, labs ordered for Monday  Platelets 146 on 6/3, labs entered for Monday  Continue to monitor 17.  GERD  Protonix ordered  LOS: 7 days A FACE TO FACE EVALUATION WAS PERFORMED  Chauncey Sciulli ALorie Phenix6/08/2020, 11:19 AM

## 2020-11-13 NOTE — Progress Notes (Addendum)
Physical Therapy Session Note  Patient Details  Name: Erica [name] MRN: [id number] Date of Birth: 12-01-1955  Today's Date: 11/13/2020 PT Individual Time:Session1: 1237-9909; Arthor Captain: [id number] PT Individual Time Calculation (min): 57 min & 70 min  Short Term Goals: Week 1:  PT Short Term Goal 1 (Week 1): STG = LTG based on ELOS  Skilled Therapeutic Interventions/Progress Updates:    Session1:  Patient in supine and reports feeling a little better.  States had medication for L leg pain.  Performed supine to sit with CGA for balance.  Patient sit to stand to RW with CGA.  Ambulated around bed to w/c with RW and CGA.  Patient assisted in w/c to therapy gym.  Sit to stand to RW with S and ambulated to mat 15' with CGA dragging L foot at times.  Standing dynamic balance activity tapping targets on 4" step with 1 UE support initially 1 then 2 then 3 targets for SLS and memory with CGA throughout.  Patient ambulated 84' with RW and CGA cues for continuing and L step length.  Patient ambulated through floor ladder with min to mod A for walker management and cues for stride length.  Ambulated beside floor ladder with RW and min A cues for increase stride length.   Patient fatigued so assisted in w/c to room.  Stand step to bed with RW and CGA.  Sit to supine with S.  Left with bed alarm active and needs in reach.  Sessoion2: Patient in supine asleep.  Took several minutes to fully rouse stating, "I am getting up."  Performed supine to sit with S.  Sitting EOB to don tennis shoes with min A pt c/o "brain freeze" and holding head after bending over.  Patient requesitng to toilet.  Sit to stand to RW with CGA and ambulated to bathroom with close S.  Toileted with distant S.  Patient ambulated to sink and washed hands with S for balance.  Ambulated to w/c with S.  Pt's sister arrived during session.  Assisted pt to therapy gym.  In parallel bars stepping over cones with mod cues for one foot between each set of  cones and for step height with bilateral UE support.  Patient c/o dizziness after two passes.  Patient ambulated with RW x 18' with CGA and cues for stride length.  Seated for gaze stabilization exercises with near target completing 10 head turns with mod cues for maintaining target.  Patient c/o 9/10 dizziness and allowed several minutes to recover.  Completed with vertical head turns x 10 and pt reported 7/10 dizziness.  Printed and issued for HEP.  Patient ambulated another 108' with RW and S to CGA with w/c close towards room.  Assisted rest of distance to room.  Patient transferred to bed from w/c with S.  Seated EOB with sister in the room and call bell/needs in reach and bed alarm active.  Placed letter on wall for pt to use for HEP, but encouraged to perform with therapy in the room.  Therapy Documentation Precautions:  Precautions Precautions: Fall Precaution Comments: mild L hemi Restrictions Weight Bearing Restrictions: No Pain: Pain Assessment Pain Scale: 0-10 Pain Score: 5  Pain Type: Acute pain Pain Location: Leg Pain Orientation: Left Pain Descriptors / Indicators: Discomfort;Aching Pain Onset: With Activity Pain Intervention(s): Repositioned;Rest   Therapy/Group: Individual Therapy  Reginia Naas  Magda Kiel, PT 11/13/2020, 11:24 AM

## 2020-11-14 LAB — GLUCOSE, CAPILLARY
Glucose-Capillary: 102 mg/dL — ABNORMAL HIGH (ref 70–99)
Glucose-Capillary: 78 mg/dL (ref 70–99)
Glucose-Capillary: 105 mg/dL — ABNORMAL HIGH (ref 70–99)
Glucose-Capillary: 184 mg/dL — ABNORMAL HIGH (ref 70–99)

## 2020-11-14 NOTE — Progress Notes (Signed)
Speech Language Pathology Daily Session Note  Patient Details  Name: Erica [name] MRN: [id number] Date of Birth: [birth year]  Today's Date: 11/14/2020 SLP Individual Time: 8166-1969 SLP Individual Time Calculation (min): 43 min  Short Term Goals: Week 1: SLP Short Term Goal 1 (Week 1): Pt will increase recall of functional and novel information to 70% accuracy with mod A cues SLP Short Term Goal 2 (Week 1): Pt will complete mildly complex problem solving tasks with min A for error awareness and self monitoring. SLP Short Term Goal 3 (Week 1): Pt will complete money management tasks with min A verbal cues  Skilled Therapeutic Interventions: Pt seen for skilled ST with focus on cognitive skills. Pt alert but remains with slow processing. Pt completing written activity with simple mental calculations (calorie counting activity), requiring frequent repetition of prompts/information. Pt vision impacting ability to read booklet, able to record information read aloud by SLP with 100% accuracy provided moderate A cues.  Pt benefits from cues for initiation and problem solving during tasks, mild decreased motivation at times. Pt left in bed with alarm set and all needs within reach. Cont ST POC.   Pain Pain Assessment Pain Scale: 0-10 Pain Score: 0-No pain  Therapy/Group: Individual Therapy  Dewaine Conger 11/14/2020, 9:20 AM

## 2020-11-14 NOTE — Progress Notes (Signed)
PROGRESS NOTE   Subjective/Complaints: No complaints Denies pain Worked with SLP in reading, counting calories Hoarse due to medication, but breathing better  ROS: + Heartburn, hoarse voice.  Denies CP, SOB, N/V/D  Objective:   No results found. Recent Labs    11/13/20 0458  WBC 3.1*  HGB 10.3*  HCT 28.8*  PLT 146*   Recent Labs    11/13/20 0458  NA 136  K 4.5  CL 108  CO2 21*  GLUCOSE 150*  BUN 27*  CREATININE 1.73*  CALCIUM 8.9    Intake/Output Summary (Last 24 hours) at 11/14/2020 1551 Last data filed at 11/14/2020 1304 Gross per 24 hour  Intake 834 ml  Output --  Net 834 ml        Physical Exam: Vital Signs Blood pressure 133/79, pulse 71, temperature 98 F (36.7 C), temperature source Oral, resp. rate 17, weight 82.3 kg, SpO2 100 %. Gen: no distress, normal appearing HEENT: oral mucosa pink and moist, NCAT Cardio: Reg rate Chest: normal effort, normal rate of breathing Abd: soft, non-distended Ext: no edema Psych: Normal mood.  Normal behavior. Musc: No edema in extremities.  No tenderness in extremities. Neurological: Alert Motor: RUE/RLE: 4+/5 proximal distal LUE: 4-4+/5 proximal distal, stable LLE: Hip flexion, knee extension 4/5, ankle dorsiflexion 4+/5, stable  Assessment/Plan: 1. Functional deficits which require 3+ hours per day of interdisciplinary therapy in a comprehensive inpatient rehab setting.  Physiatrist is providing close team supervision and 24 hour management of active medical problems listed below.  Physiatrist and rehab team continue to assess barriers to discharge/monitor patient progress toward functional and medical goals  Care Tool:  Bathing    Body parts bathed by patient: Right arm,Left arm,Chest,Abdomen,Front perineal area,Buttocks,Right upper leg,Left upper leg,Right lower leg,Left lower leg,Face         Bathing assist Assist Level: Set up assist      Upper Body Dressing/Undressing Upper body dressing   What is the patient wearing?: Pull over shirt    Upper body assist Assist Level: Set up assist    Lower Body Dressing/Undressing Lower body dressing      What is the patient wearing?: Underwear/pull up,Pants     Lower body assist Assist for lower body dressing: Supervision/Verbal cueing     Toileting Toileting    Toileting assist Assist for toileting: Supervision/Verbal cueing     Transfers Chair/bed transfer  Transfers assist     Chair/bed transfer assist level: Supervision/Verbal cueing     Locomotion Ambulation   Ambulation assist      Assist level: Contact Guard/Touching assist Assistive device: Walker-rolling Max distance: 80'   Walk 10 feet activity   Assist     Assist level: Contact Guard/Touching assist Assistive device: Walker-rolling   Walk 50 feet activity   Assist    Assist level: Contact Guard/Touching assist Assistive device: Walker-rolling    Walk 150 feet activity   Assist Walk 150 feet activity did not occur: Safety/medical concerns         Walk 10 feet on uneven surface  activity   Assist Walk 10 feet on uneven surfaces activity did not occur: Safety/medical concerns  Wheelchair     Assist Will patient use wheelchair at discharge?: No Type of Wheelchair: Manual    Wheelchair assist level: Minimal Assistance - Patient > 75% Max wheelchair distance: 35'    Wheelchair 50 feet with 2 turns activity    Assist        Assist Level: Minimal Assistance - Patient > 75%   Wheelchair 150 feet activity     Assist          Medical Problem List and Plan: 1.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 Antithrombotics: -DVT/anticoagulation:Subcutaneous heparin -antiplatelet therapy:  Aspirin 81 mg daily and Plavix 75 mg dailyx 3 weeks then aspirin alone 3. Pain Management:Voltaren gel as needed Neurontin 300 mg twice daily as needed  -continue tramadol 24m q8 prn for severe headache  Controlled on 6/4 4. Depression:Continue Effexor 75 mg daily, BuSpar 10 mg twice daily, trazodone 50 mg nightly -antipsychotic agents: N/A 5. Neuropsych: This patientiscapable of making decisions on herown behalf. 6. Skin/Wound Care:Routine skin checks 7. Fluids/Electrolytes/Nutrition:Routine in and outs with follow-up chemistries 8. Hypertension. Cozaar 50 mg daily  Norvasc 5 mg daily increase to 7.5 on 6/3, continue 9. Idiopathic pulmonary fibrosis/COPD.Pirfenidone801 mg 3 times daily, continue inhalers  -lungs clear, symptoms controlled at present 10. Diabetes mellitus with hyperglycemia.   Lantus insulin 25 units twice daily, decreased to 20 BID on 6/1.   Check blood sugars before meals and at bedtime  CBG (last 3)  Recent Labs    11/13/20 2124 11/14/20 0608 11/14/20 1143  GLUCAP 258* 102* 78    Bedtime snack ordered-discussed with nursing  Labile on 6/30, will likely require NovoLog however will not make any changes today given recent adjustment 11. CKD stage III.   Creatinine 1.73 on 6/3, labs ordered for Monday  Encourage fluids  Continue to monitor 12. History of rectal cancer. Follow-up outpatient 13. Hyperlipidemia. Crestor 14. GERD. Pepcid 15. Nausea/vomiting  Added phenergan 12.5 mg q6 hours prn  Improved 16.  Pancytopenia  WBCs 3.1 on 6/3, labs ordered for Monday  Hemoglobin 10.3 on 6/3, labs ordered for Monday  Platelets 146 on 6/3, labs entered for Monday  Continue to monitor 17.  GERD  Protonix ordered  LOS: 8 days A FACE TO FACE EVALUATION WAS PERFORMED  KClide DeutscherRaulkar 11/14/2020, 3:51 PM

## 2020-11-14 NOTE — Progress Notes (Signed)
Occupational Therapy Session Note  Patient Details  Name: Erica [name] MRN: [id number] Date of Birth: September 30, 1955  Today's Date: 11/14/2020 OT Individual Time: 9470-7615 OT Individual Time Calculation (min): 60 min    Short Term Goals: Week 1:  OT Short Term Goal 1 (Week 1): STGs equal to LTGs set at supervision to modified independent level.  Skilled Therapeutic Interventions/Progress Updates:   Met pt sitting EOB, bed alarm on, and pt agreed to OT tx. Treatment session was focused on coordination of LUE and dynamic balance during activities to increase ADL performance. Pt was supervision for bed mobility to EOB. Pt reported feeling the on-going chronic pain in her leg. Pt was CGA for sit > stand transfer to RW. Pt requested to toilet and was close supervision. Pt was CGA for ambulating around room due to fear of knee buckling during ambulation. Once standing at sink, pt completed oral care tasks with set-up assist. Pt was brought to rehab gym and instructed on following checker pattern primarily using LUE and was able to complete entire task while standing with RW. Pt expressed she needed a rest break upon completion of task. Pt was not able to tolerate standing to complete bead activity due to feeling dizzy and requested to sit for remainder of session. Pt reported a medication continously makes her dizzy and feeling will eventually subside. Pt will continue to benefit from education on energy conservation techniques to increase activity tolerance. OTS downgraded bead tasks from copying the picture's placement to putting alternating colored beads in a line while sitting to decrease pt frustration and limit participation. Pt was cued to use LUE throughout activity with little carryover. Pt was able to complete entire task and assist with clean-up while sitting. Left pt lying supine in bed, bed alarm on, all needs met.   Therapy Documentation Precautions:  Precautions Precautions: Fall Precaution  Comments: mild L hemi Restrictions Weight Bearing Restrictions: No  Pain: Pain Assessment Pain Scale: 0-10 Pain Score: 5  Pain Type: Chronic pain Pain Location: Leg Pain Descriptors / Indicators: Aching;Discomfort Pain Onset: On-going Patients Stated Pain Goal: 0 Pain Intervention(s): Ambulation/increased activity   Therapy/Group: Individual Therapy  Syretta Kochel 11/14/2020, 11:30 AM

## 2020-11-15 LAB — GLUCOSE, CAPILLARY
Glucose-Capillary: 98 mg/dL (ref 70–99)
Glucose-Capillary: 131 mg/dL — ABNORMAL HIGH (ref 70–99)
Glucose-Capillary: 99 mg/dL (ref 70–99)
Glucose-Capillary: 186 mg/dL — ABNORMAL HIGH (ref 70–99)

## 2020-11-15 MED ORDER — TRAMADOL HCL 50 MG PO TABS
50.0000 mg | ORAL_TABLET | Freq: Three times a day (TID) | ORAL | Status: DC | PRN
  Administered 2020-11-16 – 2020-11-20 (×6): 50 mg via ORAL
  Filled 2020-11-15 (×6): qty 1

## 2020-11-15 NOTE — Progress Notes (Signed)
PROGRESS NOTE   Subjective/Complaints: No complaints this morning Patient's chart reviewed- No issues reported overnight SBP elevated and bradycardic  ROS: Hoarse voice improved.  Denies CP, SOB, N/V/D  Objective:   No results found. Recent Labs    11/13/20 0458  WBC 3.1*  HGB 10.3*  HCT 28.8*  PLT 146*   Recent Labs    11/13/20 0458  NA 136  K 4.5  CL 108  CO2 21*  GLUCOSE 150*  BUN 27*  CREATININE 1.73*  CALCIUM 8.9    Intake/Output Summary (Last 24 hours) at 11/15/2020 0959 Last data filed at 11/15/2020 0743 Gross per 24 hour  Intake 594 ml  Output --  Net 594 ml        Physical Exam: Vital Signs Blood pressure (!) 149/74, pulse (!) 59, temperature 97.6 F (36.4 C), temperature source Oral, resp. rate 16, weight 82.3 kg, SpO2 100 %. Gen: no distress, normal appearing HEENT: oral mucosa pink and moist, NCAT Cardio: Bradycardic Chest: normal effort, normal rate of breathing Abd: soft, non-distended Ext: no edema Psych: Normal mood.  Normal behavior. Musc: No edema in extremities.  No tenderness in extremities. Neurological: Alert Motor: RUE/RLE: 4+/5 proximal distal LUE: 4-4+/5 proximal distal, stable LLE: Hip flexion, knee extension 4/5, ankle dorsiflexion 4+/5, stable  Assessment/Plan: 1. Functional deficits which require 3+ hours per day of interdisciplinary therapy in a comprehensive inpatient rehab setting.  Physiatrist is providing close team supervision and 24 hour management of active medical problems listed below.  Physiatrist and rehab team continue to assess barriers to discharge/monitor patient progress toward functional and medical goals  Care Tool:  Bathing    Body parts bathed by patient: Right arm,Left arm,Chest,Abdomen,Front perineal area,Buttocks,Right upper leg,Left upper leg,Right lower leg,Left lower leg,Face         Bathing assist Assist Level: Set up assist      Upper Body Dressing/Undressing Upper body dressing   What is the patient wearing?: Pull over shirt    Upper body assist Assist Level: Set up assist    Lower Body Dressing/Undressing Lower body dressing      What is the patient wearing?: Underwear/pull up,Pants     Lower body assist Assist for lower body dressing: Supervision/Verbal cueing     Toileting Toileting    Toileting assist Assist for toileting: Supervision/Verbal cueing     Transfers Chair/bed transfer  Transfers assist     Chair/bed transfer assist level: Supervision/Verbal cueing     Locomotion Ambulation   Ambulation assist      Assist level: Contact Guard/Touching assist Assistive device: Walker-rolling Max distance: 80'   Walk 10 feet activity   Assist     Assist level: Contact Guard/Touching assist Assistive device: Walker-rolling   Walk 50 feet activity   Assist    Assist level: Contact Guard/Touching assist Assistive device: Walker-rolling    Walk 150 feet activity   Assist Walk 150 feet activity did not occur: Safety/medical concerns         Walk 10 feet on uneven surface  activity   Assist Walk 10 feet on uneven surfaces activity did not occur: Safety/medical concerns  Wheelchair     Assist Will patient use wheelchair at discharge?: No Type of Wheelchair: Manual    Wheelchair assist level: Minimal Assistance - Patient > 75% Max wheelchair distance: 22'    Wheelchair 50 feet with 2 turns activity    Assist        Assist Level: Minimal Assistance - Patient > 75%   Wheelchair 150 feet activity     Assist          Medical Problem List and Plan: 1.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 Antithrombotics: -DVT/anticoagulation:Subcutaneous heparin -antiplatelet therapy:  Aspirin 81 mg daily and Plavix 75 mg dailyx 3 weeks then aspirin alone 3. Pain Management:Voltaren gel as needed Neurontin 300 mg twice daily as needed  Controlled on 6/5, change tramadol to PRN for severe headache 4. Depression:Continue Effexor 75 mg daily, BuSpar 10 mg twice daily, trazodone 50 mg nightly -antipsychotic agents: N/A 5. Neuropsych: This patientiscapable of making decisions on herown behalf. 6. Skin/Wound Care:Routine skin checks 7. Fluids/Electrolytes/Nutrition:Routine in and outs with follow-up chemistries 8. Hypertension. Cozaar 50 mg daily  Norvasc 5 mg daily increase to 7.5 on 6/3, continue 9. Idiopathic pulmonary fibrosis/COPD.Pirfenidone801 mg 3 times daily, continue inhalers  -lungs clear, symptoms controlled at present 10. Diabetes mellitus with hyperglycemia.   Lantus insulin 25 units twice daily, decreased to 20 BID on 6/1.   Check blood sugars before meals and at bedtime  CBG (last 3)  Recent Labs    11/14/20 1648 11/14/20 2055 11/15/20 0559  GLUCAP 105* 184* 98    Bedtime snack ordered-discussed with nursing  Labile on 6/30, will likely require NovoLog however will not make any changes today given recent adjustment  Better controlled 6/5 11. CKD stage III.   Creatinine 1.73 on 6/3, labs ordered for Monday  Encourage fluids  Continue to monitor 12. History of rectal cancer. Follow-up outpatient 13. Hyperlipidemia. Crestor 14. GERD. Pepcid 15. Nausea/vomiting  Added phenergan 12.5 mg q6 hours prn  Improved 16.  Pancytopenia  WBCs 3.1 on 6/3, labs ordered for Monday  Hemoglobin 10.3 on 6/3, labs ordered for Monday  Platelets 146 on 6/3, labs entered for Monday  Continue to monitor 17.  GERD  Protonix ordered  LOS: 9 days A FACE TO FACE EVALUATION WAS PERFORMED  Martha Clan P Chanler Schreiter 11/15/2020, 9:59 AM

## 2020-11-15 NOTE — Progress Notes (Signed)
Physical Therapy Session Note  Patient Details  Name: Erica [name] MRN: [id number] Date of Birth: 04/30/1956  Today's Date: 11/14/2020 PT Individual Time:  1405-1500  PT Individual Time Calculation: 55 min   Short Term Goals: Week 1:  PT Short Term Goal 1 (Week 1): STG = LTG based on ELOS  Skilled Therapeutic Interventions/Progress Updates:  Patient supine in bed on entrance to room. Patient napping and takes time to become alert, but then agreeable to PT session. Patient denied pain during session.   Therapeutic Activity: Bed Mobility: Patient performed supine --> sit with supervision. At end of session, requires CGA for LLE. VC/ tc required for technique. Transfers: Patient performed STS and SPVT transfers with RW and CGA. Provided verbal cues for technique. STS x4 to no AD with close supervision to/ from EOB with improving technique throughout. Requires seated rest break for dizziness.   Gait Training:  Patient ambulated 85 feet  Including two wide 180 degree turns using RW with CGA. Provided vc/ tc for increasing step height/ length, level gaze, upright posture.  Neuromuscular Re-ed: NMR facilitated during session with focus on gaze stabilization exercises provided by primary therapist. Pt guided in horizontal then vertical head turns  With instructions to complete x10 each. She is unable to complete 10 reps with horizontal head turns and only completes ~4-5 before closing eyes and turning head away to L side. Initially no nystagmus noted. On second trial, beat noted to L side. During vertical turns, completes only 3 reps, then noted eye roll down to L with body drift to L. Following exercises, pt states " that's a trip". NMR performed for improvements in motor control and coordination, balance, and self confidence/ efficacy in performing all aspects of mobility at highest level of independence.   Discussed family's role in continued rehab upon return home and that pt will need to  continually work to improve impairments. Pt relates potential for family not fully understanding nature of stroke and stroke recovery. Pt provided with education re: nature of recovery as well as existence of support group that meets in hospital monthly. Pamphlet to be provided. Pt appreciative.   Patient supine in bed at end of session with brakes locked, bed alarm set, and all needs within reach.    Therapy Documentation Precautions:  Precautions Precautions: Fall Precaution Comments: mild L hemi Restrictions Weight Bearing Restrictions: No  Therapy/Group: Individual Therapy  [name] PT, DPT 11/14/2020, 7:22 AM

## 2020-11-16 DIAGNOSIS — E119 Type 2 diabetes mellitus without complications: Secondary | ICD-10-CM

## 2020-11-16 LAB — GLUCOSE, CAPILLARY
Glucose-Capillary: 154 mg/dL — ABNORMAL HIGH (ref 70–99)
Glucose-Capillary: 236 mg/dL — ABNORMAL HIGH (ref 70–99)
Glucose-Capillary: 152 mg/dL — ABNORMAL HIGH (ref 70–99)
Glucose-Capillary: 221 mg/dL — ABNORMAL HIGH (ref 70–99)

## 2020-11-16 MED ORDER — INSULIN GLARGINE 100 UNIT/ML ~~LOC~~ SOLN
20.0000 [IU] | Freq: Every day | SUBCUTANEOUS | Status: DC
  Administered 2020-11-16 – 2020-11-20 (×5): 20 [IU] via SUBCUTANEOUS
  Filled 2020-11-16 (×8): qty 0.2

## 2020-11-16 MED ORDER — INSULIN GLARGINE 100 UNIT/ML ~~LOC~~ SOLN
22.0000 [IU] | Freq: Every day | SUBCUTANEOUS | Status: DC
  Administered 2020-11-17 – 2020-11-19 (×3): 22 [IU] via SUBCUTANEOUS
  Filled 2020-11-16 (×3): qty 0.22

## 2020-11-16 NOTE — Progress Notes (Signed)
Speech Language Pathology Weekly Progress and Session Note  Patient Details  Name: Erica [name] MRN: [id number] Date of Birth: 11-24-1955  Beginning of progress report period: Nov 09, 2020 End of progress report period: November 16, 2020  Today's Date: 11/16/2020 SLP Individual Time: 0301-3143 SLP Individual Time Calculation (min): 50 min  Short Term Goals: Week 1: SLP Short Term Goal 1 (Week 1): Pt will increase recall of functional and novel information to 70% accuracy with mod A cues SLP Short Term Goal 1 - Progress (Week 1): Met SLP Short Term Goal 2 (Week 1): Pt will complete mildly complex problem solving tasks with min A for error awareness and self monitoring. SLP Short Term Goal 2 - Progress (Week 1): Progressing toward goal SLP Short Term Goal 3 (Week 1): Pt will complete money management tasks with min A verbal cues SLP Short Term Goal 3 - Progress (Week 1): Met    New Short Term Goals: Week 2: SLP Short Term Goal 1 (Week 2): STG=LTG due to ELOS  Weekly Progress Updates: Pt has 2 out of 3 long term goals this reporting period due to improvement in memory with [place] of visual aid, problem solving skills for basic tasks such as money management. She continues to require min A verbal cues for error awareness and attention to details when completing medication management tasks. Pt and family education is ongoing. She will benefit from cont skilled SLP intervention during rehab stay to maximize independence and cognitive skills with functional tasks.      Intensity: Minumum of 1-2 x/day, 30 to 90 minutes Frequency: 3 to 5 out of 7 days Duration/Length of Stay: 7-10 days Treatment/Interventions: Cognitive remediation/compensation;Therapeutic Activities;Therapeutic Exercise;Internal/external aids;Cueing hierarchy;Functional tasks;Medication managment;Patient/family education   Daily Session  Skilled Therapeutic Interventions: Skilled SLP intervention focused on cognition. She completed  simple word puzzle problem solving task using process of elimination and attention to details with mod A due to lethargy and decreased attention to task. Pt demonstrated decreased motivation and participation this session. She stated she had a lot on her mind and could not focus at the moment. Some improvement in responses and participation with verbal encouragement. SLP ended session 10 min early due to patient eventually laying down in bed and falling asleep. Cont with therapy per plan of care.     General    Pain Pain Assessment Pain Scale: Faces Pain Score: 7  Faces Pain Scale: No hurt Pain Type: Acute pain Pain Location: Head Pain Orientation: Right Pain Descriptors / Indicators: Aching Pain Frequency: Occasional Pain Onset: On-going Pain Intervention(s): Medication (See eMAR)  Therapy/Group: Individual Therapy  Darrol Poke Cola Gane 11/16/2020, 1:49 PM

## 2020-11-16 NOTE — Progress Notes (Signed)
Occupational Therapy Session Note  Patient Details  Name: Erica [name] MRN: [id number] Date of Birth: [birth year]  Today's Date: 11/16/2020 OT Individual Time: 1115-5208 OT Individual Time Calculation (min): 58 min    Short Term Goals: Week 1:  OT Short Term Goal 1 (Week 1): STGs equal to LTGs set at supervision to modified independent level.  Skilled Therapeutic Interventions/Progress Updates:    Patient seated edge of bed, alert, flat affect at start of session, she denies pain and did not eat any of her breakfast.  She was talkative and engaged by end of session.  She is able to gather clothing with CS.  Sit to stand and ambulation with RW to/from bed, shower bench, w/c with CS - occ cues for safety.  Shower completed seated on bench and standing with rails at set up/supervision level.  She completed dressing tasks seated edge of bed with set up/CS.  Completed functional reach with use of vending machine with CS.  She remained seated in w/c at close of session, seat belt alarm set and call bell/tray table in reach.    Therapy Documentation Precautions:  Precautions Precautions: Fall Precaution Comments: mild L hemi Restrictions Weight Bearing Restrictions: No   Therapy/Group: Individual Therapy  Carlos Levering 11/16/2020, 7:46 AM

## 2020-11-16 NOTE — Progress Notes (Signed)
Physical Therapy Note  Patient Details  Name: Erica [name] MRN: [id number] Date of Birth: [birth year] Today's Date: 11/16/2020    Today's Date: 11/16/2020 PT Missed Time: 75 Minutes Missed Time Reason: Patient fatigue;Other (Comment) (headache)   Attempted initially and pt c/o headache, but had medication so checked back after 20 minutes, and pt continued to refuse despite offering in room mobility only.   Reginia Naas  [place], [place] 11/16/2020, 4:18 PM

## 2020-11-16 NOTE — Progress Notes (Signed)
PROGRESS NOTE   Subjective/Complaints: Sitting eob. Didn't eat any breakfast. Erica [name] she didn't have much appetite for breakfast.   ROS: Patient denies fever, rash, sore throat, blurred vision, nausea, vomiting, diarrhea, cough, shortness of breath or chest pain, joint or back pain, headache, or mood change   Objective:   No results found. No results for input(s): WBC, HGB, HCT, PLT in the last 72 hours. No results for input(s): NA, K, CL, CO2, GLUCOSE, BUN, CREATININE, CALCIUM in the last 72 hours.  Intake/Output Summary (Last 24 hours) at 11/16/2020 1216 Last data filed at 11/15/2020 2000 Gross per 24 hour  Intake 677 ml  Output --  Net 677 ml        Physical Exam: Vital Signs Blood pressure 124/81, pulse 73, temperature 97.7 F (36.5 C), temperature source Oral, resp. rate 17, weight 82.3 kg, SpO2 100 %. Constitutional: No distress . Vital signs reviewed. HEENT: EOMI, oral membranes moist Neck: supple Cardiovascular: RRR without murmur. No JVD    Respiratory/Chest: CTA Bilaterally without wheezes or rales. Normal effort    GI/Abdomen: BS +, non-tender, non-distended Ext: no clubbing, cyanosis, or edema Psych: cooperativef but flat Musc: No edema in extremities.  No tenderness in extremities. Neurological: Alert Motor: RUE/RLE: 4+/5 proximal distal LUE: 4-4+/5 proximal distal, stable LLE: Hip flexion, knee extension 4/5, ankle dorsiflexion 4+/5, stable  Assessment/Plan: 1. Functional deficits which require 3+ hours per day of interdisciplinary therapy in a comprehensive inpatient rehab setting.  Physiatrist is providing close team supervision and 24 hour management of active medical problems listed below.  Physiatrist and rehab team continue to assess barriers to discharge/monitor patient progress toward functional and medical goals  Care Tool:  Bathing    Body parts bathed by patient: Right arm,Left  arm,Chest,Abdomen,Front perineal area,Buttocks,Right upper leg,Left upper leg,Right lower leg,Left lower leg,Face         Bathing assist Assist Level: Set up assist     Upper Body Dressing/Undressing Upper body dressing   What is the patient wearing?: Pull over shirt,Bra    Upper body assist Assist Level: Set up assist    Lower Body Dressing/Undressing Lower body dressing      What is the patient wearing?: Underwear/pull up,Pants     Lower body assist Assist for lower body dressing: Supervision/Verbal cueing     Toileting Toileting    Toileting assist Assist for toileting: Supervision/Verbal cueing     Transfers Chair/bed transfer  Transfers assist     Chair/bed transfer assist level: Supervision/Verbal cueing     Locomotion Ambulation   Ambulation assist      Assist level: Contact Guard/Touching assist Assistive device: Walker-rolling Max distance: 80'   Walk 10 feet activity   Assist     Assist level: Contact Guard/Touching assist Assistive device: Walker-rolling   Walk 50 feet activity   Assist    Assist level: Contact Guard/Touching assist Assistive device: Walker-rolling    Walk 150 feet activity   Assist Walk 150 feet activity did not occur: Safety/medical concerns         Walk 10 feet on uneven surface  activity   Assist Walk 10 feet on uneven surfaces activity did not occur:  Safety/medical concerns         Wheelchair     Assist Will patient use wheelchair at discharge?: No Type of Wheelchair: Manual    Wheelchair assist level: Minimal Assistance - Patient > 75% Max wheelchair distance: 59'    Wheelchair 50 feet with 2 turns activity    Assist        Assist Level: Minimal Assistance - Patient > 75%   Wheelchair 150 feet activity     Assist          BP 124/81 (BP Location: Left Arm)   Pulse 73   Temp 97.7 F (36.5 C) (Oral)   Resp 17   Wt 82.3 kg   SpO2 100%   BMI 32.14 kg/m     Medical Problem List and Plan: 1.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 Antithrombotics: -DVT/anticoagulation:Subcutaneous heparin -antiplatelet therapy: Aspirin 81 mg daily and Plavix 75 mg dailyx 3 weeks then aspirin alone 3. Pain Management:Voltaren gel as needed Neurontin 300 mg twice daily as needed  Controlled on 6/6, on tramadol PRN for severe headache 4. Depression:Continue Effexor 75 mg daily, BuSpar 10 mg twice daily, trazodone 50 mg nightly -antipsychotic agents: N/A 5. Neuropsych: This patientiscapable of making decisions on herown behalf. 6. Skin/Wound Care:Routine skin checks 7. Fluids/Electrolytes/Nutrition:Routine in and outs with follow-up chemistries 8. Hypertension. Cozaar 50 mg daily  Norvasc 5 mg daily increasedto 7.5 on 6/3  6/6 bp controlled 9. Idiopathic pulmonary fibrosis/COPD.Pirfenidone801 mg 3 times daily, continue inhalers  -lungs clear, symptoms controlled at present 10. Diabetes mellitus with hyperglycemia.   Lantus insulin 25 units twice daily, decreased to 20 BID on 6/1.   Check blood sugars before meals and at bedtime  CBG (last 3)  Recent Labs    11/15/20 2055 11/16/20 0609 11/16/20 1153  GLUCAP 186* 154* 236*    Bedtime snack ordered-discussed with nursing  Labile on 6/30, will likely require NovoLog however will not make any changes today given recent adjustment  Better controlled 6/6 but still higher in PM   -adjust lantus to 22u qam and 20 qpm 11. CKD stage III.   Creatinine 1.73 on 6/3, no labs today---will order for 6/7  Encourage fluids   12. History of rectal cancer. Follow-up outpatient 13. Hyperlipidemia. Crestor 14. GERD. Pepcid 15. Nausea/vomiting  Added phenergan 12.5 mg q6 hours  prn  Improved 16.  Pancytopenia  WBCs 3.1 on 6/3, labs ordered for Monday  Hemoglobin 10.3 on 6/3, labs ordered for Monday  Platelets 146 on 6/3, labs entered for Monday  6/6 no labs today---order for 6/7    17.  GERD  Protonix ordered  LOS: 10 days A FACE TO FACE EVALUATION WAS PERFORMED  Meredith Staggers 11/16/2020, 12:16 PM

## 2020-11-17 LAB — BASIC METABOLIC PANEL
Anion gap: 7 (ref 5–15)
BUN: 46 mg/dL — ABNORMAL HIGH (ref 8–23)
CO2: 21 mmol/L — ABNORMAL LOW (ref 22–32)
Calcium: 9.3 mg/dL (ref 8.9–10.3)
Chloride: 109 mmol/L (ref 98–111)
Creatinine, Ser: 1.74 mg/dL — ABNORMAL HIGH (ref 0.44–1.00)
GFR, Estimated: 32 mL/min — ABNORMAL LOW (ref >60)
Glucose, Bld: 119 mg/dL — ABNORMAL HIGH (ref 70–99)
Potassium: 4.4 mmol/L (ref 3.5–5.1)
Sodium: 137 mmol/L (ref 135–145)

## 2020-11-17 LAB — CBC
HCT: 31.3 % — ABNORMAL LOW (ref 36.0–46.0)
Hemoglobin: 10.9 g/dL — ABNORMAL LOW (ref 12.0–15.0)
MCH: 32.5 pg (ref 26.0–34.0)
MCHC: 34.8 g/dL (ref 30.0–36.0)
MCV: 93.4 fL (ref 80.0–100.0)
Platelets: 154 10*3/uL (ref 150–400)
RBC: 3.35 MIL/uL — ABNORMAL LOW (ref 3.87–5.11)
RDW: 16 % — ABNORMAL HIGH (ref 11.5–15.5)
WBC: 3.6 10*3/uL — ABNORMAL LOW (ref 4.0–10.5)
nRBC: 0 % (ref 0.0–0.2)

## 2020-11-17 LAB — GLUCOSE, CAPILLARY
Glucose-Capillary: 133 mg/dL — ABNORMAL HIGH (ref 70–99)
Glucose-Capillary: 126 mg/dL — ABNORMAL HIGH (ref 70–99)
Glucose-Capillary: 141 mg/dL — ABNORMAL HIGH (ref 70–99)
Glucose-Capillary: 198 mg/dL — ABNORMAL HIGH (ref 70–99)

## 2020-11-17 MED ORDER — AMLODIPINE BESYLATE 10 MG PO TABS
10.0000 mg | ORAL_TABLET | Freq: Every day | ORAL | Status: DC
  Administered 2020-11-18 – 2020-11-21 (×4): 10 mg via ORAL
  Filled 2020-11-17 (×4): qty 1

## 2020-11-17 NOTE — Progress Notes (Signed)
Physical Therapy Weekly Progress Note  Patient Details  Name: Erica [name] MRN: [id number] Date of Birth: 1955/10/19  Beginning of progress report period: Nov 07, 2020 End of progress report period: November 17, 2020  Today's Date: 11/17/2020 PT Individual Time: 1325-1408 PT Individual Time Calculation (min): 43 min   Patient has made progress towards all LTG's able to ambulated up to 140' with RW and close S, negotiates steps with rail and close S to CGA and performing sitting dynamic tasks with S and standing with S as well with intermittent UE support.  She continues to have difficulty with L LE weakness, imbalance, dizziness and occasional nausea vs headache as side effects of her stroke as well.  Patient continues to demonstrate the following deficits muscle weakness, decreased coordination and decreased motor planning, central origin and decreased standing balance, hemiplegia and and decreased activity tolerance and therefore will continue to benefit from skilled PT intervention to increase functional independence with mobility.  Patient progressing toward long term goals..  Continue plan of care.  PT Short Term Goals Week 1:  PT Short Term Goal 1 (Week 1): STG = LTG based on ELOS PT Short Term Goal 1 - Progress (Week 1): Progressing toward goal Week 2:  PT Short Term Goal 1 (Week 2): STG=LTG due to ELOS  Skilled Therapeutic Interventions/Progress Updates:  Patient seated EOB upon PT entry working to get prepared for session doffing slipper socks to don shoes reaching to floor at times to obtain shoe and to tie L shoe.  Patient sit to stand with S to RW and ambulated x 140' with RW and close S with w/c close if needed due to fatigue.  Patient still with L knee flexion in stance and occasional foot drag.  Patient assisted in w/c to ortho gym.  Patient performing standing balance activities on Aerex with no UE support 2 x 30 sec bouts pt relates feeling "wobbly" then standing step taps to aerex  with intermittent UE support alternating feet, but terminated due to pt c/o nausea.  Patient during seated rest performed gaze stabilization with focus on near target performing head turns x 10.  Patient related 9/10 dizziness symptoms.  Allowed to rest and attempted rest in supine, but pt related head too flat and sat back up to EOM.  Patient stand step to w/c with RW and S.  Assisted in w/c to room and patient stand step to EOB with RW and S.  Left seated EOB with bed alarm on and call bell/needs in reach.   Ambulation/gait training;DME/adaptive equipment instruction;Psychosocial support;Functional electrical stimulation;UE/LE Strength taining/ROM;Skin care/wound management;Balance/vestibular training;UE/LE Coordination activities;Cognitive remediation/compensation;Functional mobility training;Splinting/orthotics;Visual/perceptual remediation/compensation;Community reintegration;Neuromuscular re-education;Stair training;Wheelchair propulsion/positioning;Discharge planning;Pain management;Therapeutic Activities;Disease management/prevention;Patient/family education;Therapeutic Exercise   Therapy Documentation Precautions:  Precautions Precautions: Fall Precaution Comments: mild L hemi Restrictions Weight Bearing Restrictions: No   Therapy/Group: Individual Therapy  Reginia Naas  [place], PT 11/17/2020, 8:22 AM

## 2020-11-17 NOTE — Discharge Instructions (Addendum)
Inpatient Rehab Discharge Instructions  ASHTIN MELICHAR Discharge date and time: No discharge date for patient encounter.   Activities/Precautions/ Functional Status: Activity: As tolerated Diet: Regular Wound Care: Routine skin checks Functional status:  ___ No restrictions     ___ Walk up steps independently ___ 24/7 supervision/assistance   ___ Walk up steps with assistance ___ Intermittent supervision/assistance  ___ Bathe/dress independently ___ Walk with walker     _x__ Bathe/dress with assistance ___ Walk Independently    ___ Shower independently ___ Walk with assistance    ___ Shower with assistance ___ No alcohol     ___ Return to work/school ________  Special Instructions: No driving smoking or alcohol      COMMUNITY REFERRALS UPON DISCHARGE:    Home Health:   PT, OT, SP                 Agency:[facility] [phone]   Medical Equipment/Items Ordered:HAS FROM PREVIOUS ADMITS                                                 Agency/Supplier: NA   My questions have been answered and I understand these instructions. I will adhere to these goals and the provided educational materials after my discharge from the hospital.  Patient/Caregiver Signature _______________________________ Date __________  Clinician Signature _______________________________________ Date __________  Please bring this form and your medication list with you to all your follow-up doctor's appointments.

## 2020-11-17 NOTE — Progress Notes (Addendum)
Speech Language Pathology Daily Session Note  Patient Details  Name: MARYBEL ALCOTT MRN: [id number] Date of Birth: 1956/03/11  Today's Date: 11/17/2020 SLP Individual Time: 0830-0900 SLP Individual Time Calculation (min): 30 min  Short Term Goals: Week 2: SLP Short Term Goal 1 (Week 2): STG=LTG due to ELOS  Skilled Therapeutic Interventions: skilled SLP intervention focused on cognition. Pt completed mildly complex word problem solving task with min-mod a. Pt demonstrated slow processing and required direction to complete task. She increased accuracy when given written clues to define words that were incomplete. Increased participation alertness and attention to tasks this session. She continues to require min-mod A for attention and processing with problem solving tasks. Cont with therapy per plan of care.      Pain Pain Assessment Pain Scale: Faces Faces Pain Scale: No hurt  Therapy/Group: Individual Therapy  Darrol Poke Reginia Battie 11/17/2020, 8:57 AM

## 2020-11-17 NOTE — Progress Notes (Signed)
PROGRESS NOTE   Subjective/Complaints: Lying in bed. Says she feels better today. Had h/a's yesterday. Asked how long she would be having these. Asked her if they are consistent throughout the day and she told me that they tend to come on when she is stressed. Medications help headaches when they happen  ROS: Patient denies fever, rash, sore throat, blurred vision, nausea, vomiting, diarrhea, cough, shortness of breath or chest pain, joint or back pain,   or mood change.    Objective:   No results found. Recent Labs    11/17/20 0501  WBC 3.6*  HGB 10.9*  HCT 31.3*  PLT 154   Recent Labs    11/17/20 0501  NA 137  K 4.4  CL 109  CO2 21*  GLUCOSE 119*  BUN 46*  CREATININE 1.74*  CALCIUM 9.3    Intake/Output Summary (Last 24 hours) at 11/17/2020 1313 Last data filed at 11/17/2020 0733 Gross per 24 hour  Intake 674 ml  Output --  Net 674 ml        Physical Exam: Vital Signs Blood pressure (!) 155/100, pulse 76, temperature 97.8 F (36.6 C), temperature source Oral, resp. rate 18, weight 82.3 kg, SpO2 98 %. Constitutional: No distress . Vital signs reviewed. HEENT: EOMI, oral membranes moist Neck: supple Cardiovascular: RRR without murmur. No JVD    Respiratory/Chest: CTA Bilaterally without wheezes or rales. Normal effort    GI/Abdomen: BS +, non-tender, non-distended Ext: no clubbing, cyanosis, or edema Psych: reserved but pleasant, engaged with me. Musc: No edema in extremities.  No tenderness in extremities. Neurological: Alert. Fair insight and awareness. Normal language Motor: RUE/RLE: 4+/5 proximal distal LUE: 4-4+/5 proximal distal, stable LLE: Hip flexion, knee extension 4/5, ankle dorsiflexion 4+/5, stable  Assessment/Plan: 1. Functional deficits which require 3+ hours per day of interdisciplinary therapy in a comprehensive inpatient rehab setting.  Physiatrist is providing close team supervision  and 24 hour management of active medical problems listed below.  Physiatrist and rehab team continue to assess barriers to discharge/monitor patient progress toward functional and medical goals  Care Tool:  Bathing    Body parts bathed by patient: Front perineal area,Buttocks,Face         Bathing assist Assist Level: Supervision/Verbal cueing (in standing with RW)     Upper Body Dressing/Undressing Upper body dressing   What is the patient wearing?: Pull over shirt,Bra    Upper body assist Assist Level: Set up assist    Lower Body Dressing/Undressing Lower body dressing      What is the patient wearing?: Underwear/pull up,Pants     Lower body assist Assist for lower body dressing: Supervision/Verbal cueing     Toileting Toileting    Toileting assist Assist for toileting: Supervision/Verbal cueing     Transfers Chair/bed transfer  Transfers assist     Chair/bed transfer assist level: Supervision/Verbal cueing     Locomotion Ambulation   Ambulation assist      Assist level: Contact Guard/Touching assist Assistive device: Walker-rolling Max distance: 80'   Walk 10 feet activity   Assist     Assist level: Contact Guard/Touching assist Assistive device: Walker-rolling   Walk 50 feet activity  Assist    Assist level: Contact Guard/Touching assist Assistive device: Walker-rolling    Walk 150 feet activity   Assist Walk 150 feet activity did not occur: Safety/medical concerns         Walk 10 feet on uneven surface  activity   Assist Walk 10 feet on uneven surfaces activity did not occur: Safety/medical concerns         Wheelchair     Assist Will patient use wheelchair at discharge?: No Type of Wheelchair: Manual    Wheelchair assist level: Minimal Assistance - Patient > 75% Max wheelchair distance: 71'    Wheelchair 50 feet with 2 turns activity    Assist        Assist Level: Minimal Assistance - Patient >  75%   Wheelchair 150 feet activity     Assist          BP (!) 155/100 (BP Location: Left Arm)   Pulse 76   Temp 97.8 F (36.6 C) (Oral)   Resp 18   Wt 82.3 kg   SpO2 98%   BMI 32.14 kg/m    Medical Problem List and Plan: 1.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 Will need someone at home for her d/t safety concerns 2. Antithrombotics: -DVT/anticoagulation:Subcutaneous heparin -antiplatelet therapy: Aspirin 81 mg daily and Plavix 75 mg dailyx 3 weeks then aspirin alone 3. Pain Management:Voltaren gel as needed Neurontin 300 mg twice daily as needed  6/7 continue tramadol PRN for severe headache   -discussed stress mgt strategies today 4. Depression:Continue Effexor 75 mg daily, BuSpar 10 mg twice daily, trazodone 50 mg nightly -antipsychotic agents: N/A 5. Neuropsych: This patientiscapable of making decisions on herown behalf. 6. Skin/Wound Care:Routine skin checks 7. Fluids/Electrolytes/Nutrition:Routine in and outs with follow-up chemistries 8. Hypertension. Cozaar 50 mg daily  Norvasc 5 mg daily increased to 7.5 on 6/3  6/7 bp still elevated. May have contribution to headaches---incr norvasc to 78m 9. Idiopathic pulmonary fibrosis/COPD.Pirfenidone801 mg 3 times daily, continue inhalers  -lungs clear, symptoms controlled at present 10. Diabetes mellitus with hyperglycemia.   Lantus insulin 25 units twice daily, decreased to 20 BID on 6/1.   Check blood sugars before meals and at bedtime  CBG (last 3)  Recent Labs    11/16/20 2020 11/17/20 0623 11/17/20 1159  GLUCAP 221* 133* 126*    Bedtime snack ordered-discussed with nursing  Labile on 6/30, will likely require NovoLog  however will not make any changes today given recent adjustment  Better controlled 6/6 but still higher in PM   -adjusted lantus to 22u qam and 20 qpm yesterday---observe for response today 11. CKD stage III.   Creatinine 1.73 on 6/3, BUN up to 46 today--encourage fluids   Cr 1.74 12. History of rectal cancer. Follow-up outpatient 13. Hyperlipidemia. Crestor 14. GERD. Pepcid 15. Nausea/vomiting  Added phenergan 12.5 mg q6 hours prn  Improved 16.  Pancytopenia  WBCs 3.1 on 6/3-->3.6  Hemoglobin 10.3 on 6/3--> 10.9  Platelets 146 on 6/3--> 154k       17.  GERD  Protonix ordered  LOS: 11 days A FACE TO FACE EVALUATION WAS PERFORMED  ZMeredith Staggers6/12/2020, 1:13 PM

## 2020-11-17 NOTE — Progress Notes (Signed)
Occupational Therapy Session Note  Patient Details  Name: Erica [name] MRN: [id number] Date of Birth: March 05, 1956  Today's Date: 11/17/2020 OT Individual Time: 1100-1200, 1430-1445 OT Individual Time Calculation (min): 60 min and 15 min    Short Term Goals: Week 1:  OT Short Term Goal 1 (Week 1): STGs equal to LTGs set at supervision to modified independent level.  Skilled Therapeutic Interventions/Progress Updates:    Session 1: Pt received supine in bed asleep, easy to arouse, and agreeable to OT. Pt demo'd flat affect through most of session and extra time for ADL tasks; therapeutic listening and encouragement provided throughout session with occasional improvement in affect. Sup>sit, sit<>stands with RW, and ambulation transfers with RW all performed at close spvsn level. Pt completed 3/3 toileting with distant supervision. Pt completed peri hygiene in stance with grab bars and declined further bathing. LB dressing seated and in stance with close spvsn. Pt engaged in oral hygiene and grooming tasks standing at sink with RW with education provided for energy conservation techniques for home and completing tasks with chair nearby. Pt ambulated ~100' with RW before requiring seated rest break due to fatigue. Pt was taken outside for psychosocial health and completed 3-4 BUE and trunk stretches, but declining exercises and further ambulation. Pt left seated on bed with NT present, all needs met.  Session 2: Pt received supine in bed, reporting increased nausea due to recent medication and declining ADLs and OOB activities. Pt provided therapeutic listening to build rapport and encourage participation. Pt offered comfort measures but declined. Pt offered UE exercises in bed but declined. Remained in bed, alarm set, all needs met.   Therapy Documentation Precautions:  Precautions Precautions: Fall Precaution Comments: mild L hemi Restrictions Weight Bearing Restrictions: No General: OT missed  time: 15 minutes in PM session  Pain: Pain Assessment Pain Scale: 0-10 Pain Score: 0-No pain Faces Pain Scale: No hurt   Therapy/Group: Individual Therapy  Mellissa Kohut 11/17/2020, 12:56 PM

## 2020-11-17 NOTE — Patient Care Conference (Signed)
Inpatient RehabilitationTeam Conference and Plan of Care Update Date: 11/17/2020   Time: 12:22 PM    Patient Name: Erica [name]      Medical Record Number: [id number]  Date of Birth: February 22, 1956 Sex: Female         Room/Bed: 15C02C/5C02C-01 Payor Info: Payor: [employer] MEDICARE / Plan: [place] HMO / Product Type: *No Product type* /    Admit Date/Time:  11/06/2020  4:31 PM  Primary Diagnosis:  Parietal lobe infarction [facility])  Hospital Problems: Principal Problem:   Parietal lobe infarction [facility]) Active Problems:   Leukopenia   Controlled type 2 diabetes mellitus with hyperglycemia, with long-term current use of insulin [facility])   Vascular headache    Expected Discharge Date: Expected Discharge Date: 11/21/20  Team Members Present: Physician leading conference: Dr. [name] Care Coodinator Present: Dorien Chihuahua, RN, BSN, CRRN;Becky Dupree, LCSW Nurse Present: Dorien Chihuahua, RN PT Present: Magda Kiel, PT OT Present: Elisabeth Most, OT SLP Present: Weston Anna, SLP PPS Coordinator present : Ileana Ladd, PT     Current Status/Progress Goal Weekly Team Focus  Bowel/Bladder   pt is cont of B/B. LBM 11/15/20  Pt remain cont of B/B  toliet pt q2h and PRN   Swallow/Nutrition/ Hydration             ADL's   adl/bathing CS/set up, functional trasnfers and ambulation with RW CS/CGA  set up/CS  adl/transfer training, balance, basic HM   Mobility   S for transfers and S/CGA for gait up to 100' with RW, steps with CGA  S overall  gait endurance, participation, balance, awareness, safety   Communication             Safety/Cognition/ Behavioral Observations  mod A  Supervision  complex problem solving and recall with use of strategies   Pain   Pt states no pain  Pt remain pain free  Assess for pain qshift and provide pain relief measures as needed.   Skin   Skin is clean and intact; free of injury or breakdown  Skin to remain intact  Assess skin qshift and provide skin care      Discharge Planning:  Pt needs to be as independent as possible at discharge due to will have intermittent assist, son that lives there does not assist her   Team Discussion: HA and stress management; flat affect and tearful during sessions limiting progress. Took into kitchen area for practice and did well but limited by fatigue and vestibular issues.  Patient on target to meet rehab goals: yes, currently  Supervision to CGA 100', CGA for steps and min assist for management of left sock;.  Requires cues for safety and mod assist for cognition. Supervision - CGA goals set for discharge..    *See Care Plan and progress notes for long and short-term goals.   Revisions to Treatment Plan:  Not much progress with SLP; working on alertness and attention Teaching Needs: Safety, secondary stroke risk management, medications, transfers, toileting, etc.  Current Barriers to Discharge: Decreased caregiver support and Home enviroment access/layout  Possible Resolutions to Barriers: Education with son/sister 24/7 supervision recommended: Recommend home with sister short term; patient declines     Medical Summary Current Status: intermittent headaches, stress driven. CBG's still labile. bp inconsistent as well  Barriers to Discharge: Medical stability   Possible Resolutions to [employer] Focus: daily assessment of labs, pt data. discussed stress relieving techniques, adjustment to dm regimen   Continued Need for Acute Rehabilitation Level of  Care: The patient requires daily medical management by a physician with specialized training in physical medicine and rehabilitation for the following reasons: Direction of a multidisciplinary physical rehabilitation program to maximize functional independence : Yes Medical management of patient stability for increased activity during participation in an intensive rehabilitation regime.: Yes Analysis of laboratory values and/or radiology reports with  any subsequent need for medication adjustment and/or medical intervention. : Yes   I attest that I was present, lead the team conference, and concur with the assessment and plan of the team.   Dorien Chihuahua B 11/17/2020, 3:08 PM

## 2020-11-18 LAB — GLUCOSE, CAPILLARY
Glucose-Capillary: 94 mg/dL (ref 70–99)
Glucose-Capillary: 147 mg/dL — ABNORMAL HIGH (ref 70–99)
Glucose-Capillary: 159 mg/dL — ABNORMAL HIGH (ref 70–99)
Glucose-Capillary: 176 mg/dL — ABNORMAL HIGH (ref 70–99)

## 2020-11-18 NOTE — Progress Notes (Addendum)
Physical Therapy Session Note  Patient Details  Name: Erica [name] MRN: [id number] Date of Birth: [birth year]  Today's Date: 11/18/2020 PT Individual Time: 1100- 1150 PT Individual Time Calculation (min): 50 min   Short Term Goals: Week 2:  PT Short Term Goal 1 (Week 2): STG=LTG due to ELOS  Skilled Therapeutic Interventions/Progress Updates:    Patient in supine and reports no issues.  States not planning for sister to check in on her any more than prior to admission and refusing to stay at her house.  Patient EOB to don shoes with trash can as step stool and CGA.  Patient sit to stand throughout session with S and ambulated with RW and S w/c following 130'. Patient assisted in w/c down to general gym.  Patient in parallel bars performed forward step ups to 6" step x 5 with R first, then x 5 with L first.  Side steps up x 5 with R then L with bilateral UE support and CGA.  Patient ambulated x 74' with RW and S.  Ambulated x 44' with RW then noted L knee buckling so pt in w/c assisted to ortho gym for car transfer practice.  Educated throughout session on safety and need to sit to rest when L knee buckles for fall prevention.  Also on energy conservation with regards to need to have energy to do tasks to take care of herself safely, but schedule in rest breaks so she can be safe and tolerate activities.  Patient performed car transfer to simulated small SUV height with close S with RW.  Patient reported she was "done" and declined further mobility due to fatigue.  Continued education as pt assisted in w/c to room that she would need to have a schedule for activities with rest breaks for daily activities.  Continued encouragement to discuss with sister activities she can help with when she comes to check on patient.  Patient stand step to EOB with RW and S.  Left at EOB with all needs in reach and bed alarm active.   Therapy Documentation Precautions:  Precautions Precautions: Fall Precaution  Comments: mild L hemi Restrictions Weight Bearing Restrictions: No General:  Patient missed 10 minutes of skilled PT due to fatigue. Pain: Pain Assessment Pain Scale: Faces Pain Score: 0-No pain Faces Pain Scale: No hurt   Therapy/Group: Individual Therapy  Reginia Naas  Magda Kiel, PT 11/18/2020, 11:29 AM

## 2020-11-18 NOTE — Progress Notes (Signed)
Occupational Therapy Session Note  Patient Details  Name: Erica [name] MRN: [id number] Date of Birth: Dec 30, 1955  Today's Date: 11/18/2020 OT Individual Time: 5993-5701 OT Individual Time Calculation (min): 55 min    Short Term Goals: Week 1:  OT Short Term Goal 1 (Week 1): STGs equal to LTGs set at supervision to modified independent level.  Skilled Therapeutic Interventions/Progress Updates:    Pt received supine and asleep in bed, easy to arouse, but pt declining shower/ADLs and requesting therapist to come back in 5 minutes. Pt reporting feeling cold in L hand and L side of lower face, ongoing since most recent CVA. Pt drowsy but agreeable to OT session. Completed all mobility (sup>sit, sit<>stand, and ambulation with RW transfers) at close spvsn level and no over LOB. Pt engaged in grooming activities standing at sink with RW, tolerating ~10 minutes standing with BUEs unsupported intermittently and no LOB. Pt requested rest break following grooming seated in w/c; RN provided meds. Pt taken to gym and engaged in [place] and core conditioning activities in preparation for improved tolerance to OOB activity and increased independence in functional mobility/BADLs. Pt completed 20 dowel volleys with 3lb dowel rod, 10x modified Russian twists for trunk rotation, and 5x lateral leans using weighted ball bilaterally. Vc's required for technique intermittently and pt required frequent rest breaks due to fatigue. Pt declined further activities and taken back to room, remained seated EOB, alarm set, all needs met.  Therapy Documentation Precautions:  Precautions Precautions: Fall Precaution Comments: mild L hemi Restrictions Weight Bearing Restrictions: No Pain: Pain Assessment Pain Scale: 0-10 Pain Score: 0-No pain   Therapy/Group: Individual Therapy  Mellissa Kohut 11/18/2020, 7:59 AM

## 2020-11-18 NOTE — Progress Notes (Signed)
Recreational Therapy Session Note  Patient Details  Name: Erica [name] MRN: [id number] Date of Birth: 1955-10-26 Today's Date: 11/18/2020  Pain: no c/o Skilled Therapeutic Interventions/Progress Updates: pt in the bed upon arrival stating that she didn't feel well, declined therapy session.  Pt stated that she did too much earlier today. [place] 11/18/2020, 3:23 PM

## 2020-11-18 NOTE — Progress Notes (Signed)
Occupational Therapy Session Note  Patient Details  Name: Erica [name] MRN: [id number] Date of Birth: 01/17/1956  Today's Date: 11/18/2020 OT Individual Time: 1030-1054 OT Individual Time Calculation (min): 24 min    Short Term Goals: Week 1:  OT Short Term Goal 1 (Week 1): STGs equal to LTGs set at supervision to modified independent level.  Skilled Therapeutic Interventions/Progress Updates:    Pt received asleep in bed, easily awakened by voice, denies pain but req encouragement to participate in therapy 2/2 fatigue. Flat affect throughout. Came to sitting EOB close S + increased time.  Session focus on LUE NMR in prep for improved [facility] + bimanual ADL performance. Assessed B grip strength + administered 9HPT with the following results:  R: 20, 24, 19; average of 21 lbs;  38 secs L: 19, 14, 9; average of 14 lbs; 52 secs  Food services in/out to take pt's lunch order - pt req max VCs to locate correct page on menu, but then able to order desired items with increased time. Pt req max VCs to recall items completed in therapy session, able to ind state that she did "play with pegs." Additionally, reviewed theraputty exercises to encourage L grip strength - pt recalled putty squeezes, but declined completing at this time.   Pt left seated EOB with bed alarm engaged, call bell in reach, and all immediate needs met. Awaiting following PT session.   Therapy Documentation Precautions:  Precautions Precautions: Fall Precaution Comments: mild L hemi Restrictions Weight Bearing Restrictions: No Pain: no complaints but reports being premedicated / fatigue   ADL: See Care Tool for more details.   Therapy/Group: Individual Therapy  Volanda Napoleon MS, OTR/L  11/18/2020, 6:55 AM

## 2020-11-18 NOTE — Progress Notes (Signed)
Speech Language Pathology Daily Session Note  Patient Details  Name: VERBLE STYRON MRN: [id number] Date of Birth: Sep 30, 1955  Today's Date: 11/18/2020 SLP Individual Time: 0930-1030 SLP Individual Time Calculation (min): 60 min  Short Term Goals: Week 2: SLP Short Term Goal 1 (Week 2): STG=LTG due to ELOS  Skilled Therapeutic Interventions: Skilled SLP intervention focused on cognition. Pt completed written problem solving task requiring her to follow 2 step directions with min A for error awareness with details. She completed medication management task with pill organizer with min-mod A. Pt demonstrated slow processing and required moderate visual cues with deductive reasoning puzzle. She transferred to bathroom with FWW with CGA and supervision A verbal cues for transfers. Pt left lying in bed with bed alarm set and call button within reach. Cont with therapy per plan of care.       Pain Pain Assessment Pain Scale: Faces Pain Score: 0-No pain Faces Pain Scale: No hurt  Therapy/Group: Individual Therapy  Darrol Poke Ever[place] Bertha 11/18/2020, 10:19 AM

## 2020-11-18 NOTE — Progress Notes (Signed)
Patient ID: Erica [name], female   DOB: 1955-07-19, 65 y.o.   MRN: [id number]  Met with pt to discuss progress this week and discharge date still planned for 6/11. Team recommends her to go to sister's home where she will have supervision. She does not plans to do this and plans on going home with sister coming to check on and assist some. She wants [facility] home health again and is actually an active pt still with them. Has all equipment from before. Will continue to work on discharge for Sat.

## 2020-11-19 LAB — GLUCOSE, CAPILLARY
Glucose-Capillary: 140 mg/dL — ABNORMAL HIGH (ref 70–99)
Glucose-Capillary: 117 mg/dL — ABNORMAL HIGH (ref 70–99)
Glucose-Capillary: 170 mg/dL — ABNORMAL HIGH (ref 70–99)
Glucose-Capillary: 166 mg/dL — ABNORMAL HIGH (ref 70–99)

## 2020-11-19 MED ORDER — INSULIN GLARGINE 100 UNIT/ML ~~LOC~~ SOLN
24.0000 [IU] | Freq: Every day | SUBCUTANEOUS | Status: DC
  Administered 2020-11-20 – 2020-11-21 (×2): 24 [IU] via SUBCUTANEOUS
  Filled 2020-11-19 (×2): qty 0.24

## 2020-11-19 NOTE — Progress Notes (Signed)
Occupational Therapy Session Note  Patient Details  Name: Erica [name] MRN: [id number] Date of Birth: 19-Aug-1955  Today's Date: 11/19/2020 OT Individual Time: 1017-1100 OT Individual Time Calculation (min): 43 min    Short Term Goals: Week 1:  OT Short Term Goal 1 (Week 1): STGs equal to LTGs set at supervision to modified independent level.  Skilled Therapeutic Interventions/Progress Updates:    Pt received supine in bed, agreeable to OT, and reporting no pain/feeling better than yesterday but "not great". Pt performed all bed mobility, sit<>stands, and ambulatory transfers with RW with close spvsn for safety due to balance deficits and L knee buckling occasionally. Pt doffed ted hose and donned nonskid slipper socks EOB with close spvsn and increased time due to difficulties moving LLE; later provided min A due to fatigue. Pt completed shower using TTB seated and in standing with close spvsn and vc's for safety in stance as pt wanted to step over threshold of shower without using grab bars/AD while shower floor was still wet. UB dressing set up A seated EOB; LB dressing close spvsn in stance with RW EOB. Pt demo'd improved safety in LB dressing, sitting to thread legs without vc's. Throughout session, pt reporting some uneasiness with returning home, but thinks she can do everything well on her own and occasional help of her sister. Pt completed oral hygiene seated EOB with set up A and remained EOB, alarm active, all needs met, and call bell in reach.   Therapy Documentation Precautions:  Precautions Precautions: Fall Precaution Comments: mild L hemi Restrictions Weight Bearing Restrictions: No  Pain: Pain Assessment Pain Scale: 0-10 Pain Score: 0-No pain  Therapy/Group: Individual Therapy  Mellissa Kohut 11/19/2020, 2:13 PM

## 2020-11-19 NOTE — Progress Notes (Signed)
Speech Language Pathology Daily Session Note  Patient Details  Name: Erica [name] MRN: [id number] Date of Birth: 1955/07/14  Today's Date: 11/19/2020 SLP Individual Time: 0815-0900 SLP Individual Time Calculation (min): 45 min  Short Term Goals: Week 2: SLP Short Term Goal 1 (Week 2): STG=LTG due to ELOS  Skilled Therapeutic Interventions: Skilled SLP intervention focused on cognition and discharge education. SLP shared information from social workers note regarding plan to go to sisters home. Pt stated "No, Im not going to her home. I'll do better at my own home." Mod A needed for reasoning and anticipatory awareness with physical limitations. She was able to explain the relationship she has with her son who occasionally cooks and assists with some tasks but is not consistently present or involved in her care. Spoke with PT regarding dc plan and safety at end of session. Pt left seated at EOB to begin PT session. Cont with therapy per plan of care.      Pain Pain Assessment Pain Scale: Faces Pain Score: 0-No pain Faces Pain Scale: No hurt  Therapy/Group: Individual Therapy  Darrol Poke Nassim Cosma 11/19/2020, 8:57 AM

## 2020-11-19 NOTE — Progress Notes (Signed)
PROGRESS NOTE   Subjective/Complaints: Pt states she feels pretty well today. Headaches seem better but can come ok. Admittedly they are associated stress levels  ROS: Patient denies fever, rash, sore throat, blurred vision, nausea, vomiting, diarrhea, cough, shortness of breath or chest pain, joint or back pain,   or mood change.   Objective:   No results found. Recent Labs    11/17/20 0501  WBC 3.6*  HGB 10.9*  HCT 31.3*  PLT 154   Recent Labs    11/17/20 0501  NA 137  K 4.4  CL 109  CO2 21*  GLUCOSE 119*  BUN 46*  CREATININE 1.74*  CALCIUM 9.3    Intake/Output Summary (Last 24 hours) at 11/19/2020 0925 Last data filed at 11/19/2020 0700 Gross per 24 hour  Intake 320 ml  Output --  Net 320 ml        Physical Exam: Vital Signs Blood pressure 126/82, pulse 79, temperature 97.7 F (36.5 C), temperature source Oral, resp. rate 15, weight 82.3 kg, SpO2 100 %. Constitutional: No distress . Vital signs reviewed. HEENT: EOMI, oral membranes moist Neck: supple Cardiovascular: RRR without murmur. No JVD    Respiratory/Chest: CTA Bilaterally without wheezes or rales. Normal effort    GI/Abdomen: BS +, non-tender, non-distended Ext: no clubbing, cyanosis, or edema Psych: pleasant and cooperative, seems more up beat. Musc: No edema in extremities.  No tenderness in extremities. Neurological: Alert. Fair insight and awareness. Normal language Motor: RUE/RLE: 4+/5 proximal distal LUE: 4-4+/5 proximal distal, stable LLE: Hip flexion, knee extension 4/5, ankle dorsiflexion 4+/5, stable  Assessment/Plan: 1. Functional deficits which require 3+ hours per day of interdisciplinary therapy in a comprehensive inpatient rehab setting. Physiatrist is providing close team supervision and 24 hour management of active medical problems listed below. Physiatrist and rehab team continue to assess barriers to discharge/monitor  patient progress toward functional and medical goals  Care Tool:  Bathing    Body parts bathed by patient: Front perineal area, Buttocks, Face         Bathing assist Assist Level: Supervision/Verbal cueing (in standing with RW)     Upper Body Dressing/Undressing Upper body dressing   What is the patient wearing?: Pull over shirt, Bra    Upper body assist Assist Level: Set up assist    Lower Body Dressing/Undressing Lower body dressing      What is the patient wearing?: Underwear/pull up, Pants     Lower body assist Assist for lower body dressing: Supervision/Verbal cueing     Toileting Toileting    Toileting assist Assist for toileting: Supervision/Verbal cueing     Transfers Chair/bed transfer  Transfers assist     Chair/bed transfer assist level: Supervision/Verbal cueing     Locomotion Ambulation   Ambulation assist      Assist level: Supervision/Verbal cueing Assistive device: Walker-rolling Max distance: 130'   Walk 10 feet activity   Assist     Assist level: Supervision/Verbal cueing Assistive device: Walker-rolling   Walk 50 feet activity   Assist    Assist level: Supervision/Verbal cueing Assistive device: Walker-rolling    Walk 150 feet activity   Assist Walk 150 feet activity did not  occur: Safety/medical concerns         Walk 10 feet on uneven surface  activity   Assist Walk 10 feet on uneven surfaces activity did not occur: Safety/medical concerns         Wheelchair     Assist Will patient use wheelchair at discharge?: No Type of Wheelchair: Manual    Wheelchair assist level: Minimal Assistance - Patient > 75% Max wheelchair distance: 55'    Wheelchair 50 feet with 2 turns activity    Assist        Assist Level: Minimal Assistance - Patient > 75%   Wheelchair 150 feet activity     Assist          BP 126/82 (BP Location: Left Arm)   Pulse 79   Temp 97.7 F (36.5 C) (Oral)    Resp 15   Wt 82.3 kg   SpO2 100%   BMI 32.14 kg/m    Medical Problem List and Plan: 1.  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  Patient declined  loop recorder  -Continue CIR therapies including PT, OT   -Interdisciplinary Team Conference today    -ELOS 6/11. Pt says that sister, niece, and son will all help at home. I asked her how much help her son would be and she said he would cook, clean. Discussed with pt re: safety concerns of team. She understands and is willing to proceed home with above family helping 2.  Antithrombotics: -DVT/anticoagulation: Subcutaneous heparin             -antiplatelet therapy: Aspirin 81 mg daily and Plavix 75 mg daily x 3 weeks then aspirin alone 3. Pain Management: Voltaren gel as needed Neurontin 300 mg twice daily as needed  6/9 continue tramadol PRN for severe headache   -reiterated stress mgt strategies today 4. Depression: Continue Effexor 75 mg daily, BuSpar 10 mg twice daily, trazodone 50 mg nightly             -antipsychotic agents: N/A 5. Neuropsych: This patient is capable of making decisions on her own behalf. 6. Skin/Wound Care: Routine skin checks 7. Fluids/Electrolytes/Nutrition: Routine in and outs with follow-up chemistries 8.  Hypertension.  Cozaar 50 mg daily  Norvasc 5 mg daily increased to 7.5 on 6/3  6/9 bp better on norvasc 29m daily 9.  Idiopathic pulmonary fibrosis/COPD.Pirfenidone 801 mg 3 times daily, continue inhalers  -lungs remain clear, symptoms controlled at present 10.  Diabetes mellitus with hyperglycemia.    Lantus insulin 25 units twice daily, decreased to 20 BID on 6/1.   Check blood sugars before meals and at bedtime              CBG (last 3)  Recent Labs    11/18/20 1635 11/18/20 2032 11/19/20 0611  GLUCAP 159* 176* 140*    Bedtime snack ordered-discussed with nursing  Labile on 6/30, will likely require NovoLog  however will not make any changes today given recent adjustment  Better controlled 6/6 but still higher in PM   6/9 -adjustlantus to 24u qam and 20 qpm yesterday---observe for response today 11.  CKD stage III.   Creatinine 1.73 on 6/3, BUN up to 46 today--encourage fluids   Cr 1.74 12.  History of rectal cancer.  Follow-up outpatient 13.  Hyperlipidemia.  Crestor 14.  GERD.  Pepcid 15. Nausea/vomiting  Added phenergan 12.5 mg q6 hours prn  Improved 16.  Pancytopenia  WBCs 3.1 on 6/3-->3.6  Hemoglobin 10.3 on 6/3--> 10.9  Platelets 146 on 6/3--> 154k       17.  GERD  Protonix ordered  LOS: 13 days A FACE TO FACE EVALUATION WAS PERFORMED  Meredith Staggers 11/19/2020, 9:25 AM

## 2020-11-19 NOTE — Progress Notes (Signed)
Patient refused heparin shot scheduled for today claims she is bruised and was bleeding this morning. She claims they just need to give me a different kind other than the shot.

## 2020-11-19 NOTE — Discharge Summary (Signed)
Physician Discharge Summary  Patient ID: Erica [name] MRN: [id number] DOB/AGE: 09-12-1955 65 y.o.  Admit date: 11/06/2020 Discharge date: 11/21/2020  Discharge Diagnoses:  Principal Problem:   Parietal lobe infarction [facility]) Active Problems:   Leukopenia   Controlled type 2 diabetes mellitus with hyperglycemia, with long-term current use of insulin (HCC)   Vascular headache DVT prophylaxis Idiopathic pulmonary fibrosis/COPD CKD stage III History of rectal cancer Hyperlipidemia GERD Pancytopenia  Discharged Condition: Stable  Significant Diagnostic Studies: DG Chest 2 View  Result Date: 11/03/2020 CLINICAL DATA:  Left-sided weakness for 3 days. EXAM: CHEST - 2 VIEW COMPARISON:  PA and lateral chest 03/30/2020.  CT chest 04/12/2019. FINDINGS: Extensive pulmonary fibrosis is identified as seen on the prior exams. No consolidative process, pneumothorax or effusion. Heart size is normal. No acute or focal bony abnormality. IMPRESSION: No acute disease. Pulmonary fibrosis. Electronically Signed   By: Inge Rise M.D.   On: 11/03/2020 17:38   MR ANGIO HEAD WO CONTRAST  Result Date: 11/04/2020 CLINICAL DATA:  Follow-up examination for acute stroke. EXAM: MRA HEAD WITHOUT CONTRAST TECHNIQUE: Angiographic images of the Circle of Willis were acquired using MRA technique without intravenous contrast. COMPARISON:  Previous MRI from 11/03/2020 and MRA from 06/25/2020. FINDINGS: ANTERIOR CIRCULATION: Visualized distal cervical segments of the internal carotid arteries are patent with antegrade flow. Petrous segments widely patent bilaterally. Mild atheromatous irregularity throughout the carotid siphons without hemodynamically significant stenosis. A1 segments patent bilaterally. Normal anterior communicating artery complex. Anterior cerebral arteries patent to their distal aspects without stenosis. No M1 stenosis or occlusion. Normal MCA bifurcations. Distal MCA branches remain well perfused and  symmetric. POSTERIOR CIRCULATION: Both vertebral arteries widely patent to the vertebrobasilar junction without stenosis. Both PICA origins patent and normal. 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 Basilar widely patent distally without stenosis. Superior cerebellar arteries patent bilaterally. Both PCAs primarily supplied via the basilar. Mild atheromatous irregularity throughout the PCAs bilaterally without large vessel occlusion. Short-segment moderate left P2 stenosis, mildly progressed from previous (series 1029, image 8). No intracranial aneurysm. IMPRESSION: 1. Negative MRA for large vessel occlusion. 2. Atheromatous irregularity involving the PCAs bilaterally with superimposed moderate left P2 stenosis, mildly progressed from previous. 3. No other hemodynamically significant or correctable stenosis about the major intracranial arterial vasculature. Electronically Signed   By: Jeannine Boga M.D.   On: 11/04/2020 01:44   MR Brain Wo Contrast (neuro protocol)  Result Date: 11/03/2020 CLINICAL DATA:  Worsening of left arm and leg numbness. Acute infarction in January of this year affecting the right thalamus. EXAM: MRI HEAD WITHOUT CONTRAST TECHNIQUE: Multiplanar, multiecho pulse sequences of the brain and surrounding structures were obtained without intravenous contrast. COMPARISON:  06/25/2010 FINDINGS: Brain: New punctate acute infarction at the right medial parietooccipital junction region. No other acute finding. Chronic small-vessel ischemic changes affect the pons. Few old small vessel cerebellar infarctions. Old small vessel infarctions in the right thalamus, the left thalamus, the basal ganglia and cerebral hemispheric white matter. Scattered old cortical and subcortical infarctions in both occipital lobes and both frontoparietal regions. No mass, hemorrhage, hydrocephalus or extra-axial collection. Vascular: Major vessels at the base of the brain show flow.  Skull and upper cervical spine: Negative Sinuses/Orbits: Clear/normal Other: None IMPRESSION: Newly seen punctate acute infarction at the medial right parietooccipital junction. No other change since the most recent exam of January. Chronic small-vessel ischemic changes of the pons, cerebellum, thalami, basal ganglia and hemispheric white matter. Old cortical and subcortical infarctions in the  occipital regions and frontoparietal regions. Electronically Signed   By: Nelson Chimes M.D.   On: 11/03/2020 19:14   MR CERVICAL SPINE WO CONTRAST  Result Date: 11/04/2020 CLINICAL DATA:  Initial evaluation for spinal stenosis. EXAM: MRI CERVICAL SPINE WITHOUT CONTRAST TECHNIQUE: Multiplanar, multisequence MR imaging of the cervical spine was performed. No intravenous contrast was administered. COMPARISON:  Prior MRI from 06/26/2020. FINDINGS: Alignment: Smooth reversal of the normal cervical lordosis with trace stepwise anterolisthesis of C2 on C3 through C4 on C5, stable from previous. Vertebrae: Vertebral body height maintained without acute or interval fracture. Bone marrow signal intensity within normal limits. No discrete or worrisome osseous lesions. Discogenic reactive endplate change noted about the C6-7 interspace. No abnormal marrow edema. Cord: Again seen is patchy signal abnormality involving the central/dorsal aspect of the cervical spinal cord, extending from the level of C2 through C7-T1. Signal change measures up to 3 mm in maximal diameter at the level of C2-3. Overall appearance is relatively unchanged from prior. 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 The underlying cord is again noted to be diffusely atrophic, also stable. Posterior Fossa, vertebral arteries, paraspinal tissues: Atrophy with chronic microvascular ischemic changes noted within the visualized brain.  Craniocervical junction within normal limits. Paraspinous and prevertebral soft tissues within normal limits. Normal flow voids seen within the vertebral arteries bilaterally. Disc levels: C2-C3: Trace anterolisthesis. Tiny central disc protrusion minimally indents the ventral thecal sac. No spinal stenosis. Foramina are widely patent. C3-C4: Trace anterolisthesis. Shallow central disc protrusion minimally indents the ventral thecal sac. Left-sided facet degeneration. No spinal stenosis. Foramina remain widely patent. C4-C5: Trace anterolisthesis. Small central disc protrusion indents the ventral thecal sac. Mild flattening of the ventral cord without significant spinal stenosis. Foramina remain widely patent. C5-C6: Degenerative intervertebral disc space narrowing. Broad-based central disc osteophyte complex indents the ventral thecal sac, asymmetric to the left. Mild flattening of the ventral cord with resultant mild spinal stenosis. Foramina remain patent. Appearance is stable. C6-C7: Degenerative intervertebral disc space narrowing. Broad-based central disc osteophyte complex indents the ventral thecal sac, contacting and flattening the ventral spinal cord. Mild spinal stenosis. Foramina remain patent. Appearance is stable. C7-T1: Right paracentral disc extrusion with both superior and inferior migration, slightly decreased in size and regressed as compared to previous. Disc material continues to contact the right ventral cord with mild cord flattening (series 9, image 27). Persistent mild spinal stenosis. Superimposed mild facet and ligament flavum hypertrophy. Foramina remain patent. T1-2: Normal interspace. Mild left greater than right facet hypertrophy. No stenosis. T2-3: Small right paracentral disc extrusion mildly indents the right ventral thecal sac. Slight superior migration of disc material. No significant spinal stenosis. Foramina remain patent. IMPRESSION: 1. Persistent patchy signal abnormality  involving the central/dorsal aspect of the cervical spinal cord, extending from C2 through C7-T1, relatively stable and unchanged from previous. 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 previous myelitis). The underlying cord is diffusely atrophic, also stable. 2. Slight interval regression of right paracentral disc extrusion at C7-T1, with persistent mild spinal stenosis and right-sided cord flattening at this level. 3. Otherwise stable multilevel cervical spondylosis with resultant mild spinal stenosis at C5-6 and C6-7. Electronically Signed   By: Jeannine Boga M.D.   On: 11/04/2020 01:18   ECHOCARDIOGRAM COMPLETE  Result Date: 11/04/2020  ECHOCARDIOGRAM REPORT   Patient Name:   MAIRIM BADE Date of Exam: 11/04/2020 Medical Rec #:  [id number]    Height:       63.0 in Accession #:    [id number]   Weight:       183.6 lb Date of Birth:  22-Jun-1955    BSA:          1.865 m Patient Age:    1 years     BP:           157/76 mmHg Patient Gender: F            HR:           50 bpm. Exam Location:  Inpatient Procedure: 2D Echo, Cardiac Doppler and Color Doppler Indications:    CVA  History:        Patient has prior history of Echocardiogram examinations, most                 recent 06/26/2020. Risk Factors:Hypertension and Diabetes.  Sonographer:    Luisa Hart RDCS Referring Phys: [id number] [place]  1. Left ventricular ejection fraction, by estimation, is 60 to 65%. The left ventricle has normal function. The left ventricle has no regional wall motion abnormalities. There is moderate concentric left ventricular hypertrophy. Left ventricular diastolic parameters are consistent with Grade I diastolic dysfunction (impaired relaxation). Elevated left atrial pressure.  2. Right ventricular systolic function is normal. The right ventricular size is normal.  3. The  mitral valve is normal in structure. No evidence of mitral valve regurgitation. No evidence of mitral stenosis.  4. The aortic valve is tricuspid. There is mild calcification of the aortic valve. There is mild thickening of the aortic valve. Aortic valve regurgitation is mild. Mild aortic valve sclerosis is present, with no evidence of aortic valve stenosis.  5. The inferior vena cava is normal in size with greater than 50% respiratory variability, suggesting right atrial pressure of 3 mmHg. Comparison(s): No significant change from prior study. Prior images reviewed side by side. FINDINGS  Left Ventricle: Left ventricular ejection fraction, by estimation, is 60 to 65%. The left ventricle has normal function. The left ventricle has no regional wall motion abnormalities. The left ventricular internal cavity size was normal in size. There is  moderate concentric left ventricular hypertrophy. Left ventricular diastolic parameters are consistent with Grade I diastolic dysfunction (impaired relaxation). Elevated left atrial pressure. Right Ventricle: The right ventricular size is normal. No increase in right ventricular wall thickness. Right ventricular systolic function is normal. Left Atrium: Left atrial size was normal in size. Right Atrium: Right atrial size was normal in size. Pericardium: There is no evidence of pericardial effusion. Mitral Valve: The mitral valve is normal in structure. There is mild thickening of the mitral valve leaflet(s). No evidence of mitral valve regurgitation. No evidence of mitral valve stenosis. MV peak gradient, 5.4 mmHg. The mean mitral valve gradient is  1.0 mmHg. Tricuspid Valve: The tricuspid valve is normal in structure. Tricuspid valve regurgitation is not demonstrated. No evidence of tricuspid stenosis. Aortic Valve: The aortic valve is tricuspid. There is mild calcification of the aortic valve. There is mild thickening of the aortic valve. Aortic valve regurgitation is mild.  Aortic regurgitation PHT measures 754 msec. Mild aortic valve sclerosis is present, with no evidence of aortic valve stenosis. Aortic valve mean gradient measures 4.0 mmHg. Aortic valve peak gradient measures 8.1 mmHg. Aortic valve area, by VTI measures 1.57 cm. Pulmonic  Valve: The pulmonic valve was normal in structure. Pulmonic valve regurgitation is not visualized. No evidence of pulmonic stenosis. Aorta: The aortic root is normal in size and structure. Venous: The inferior vena cava is normal in size with greater than 50% respiratory variability, suggesting right atrial pressure of 3 mmHg. IAS/Shunts: No atrial level shunt detected by color flow Doppler.  LEFT VENTRICLE PLAX 2D LVIDd:         4.10 cm     Diastology LVIDs:         2.50 cm     LV e' medial:    3.73 cm/s LV PW:         1.50 cm     LV E/e' medial:  18.3 LV IVS:        1.50 cm     LV e' lateral:   4.54 cm/s LVOT diam:     1.80 cm     LV E/e' lateral: 15.0 LV SV:         56 LV SV Index:   30 LVOT Area:     2.54 cm  LV Volumes (MOD) LV vol d, MOD A2C: 33.1 ml LV vol d, MOD A4C: 54.0 ml LV vol s, MOD A2C: 17.1 ml LV vol s, MOD A4C: 14.4 ml LV SV MOD A2C:     16.0 ml LV SV MOD A4C:     54.0 ml LV SV MOD BP:      27.8 ml RIGHT VENTRICLE RV Basal diam:  3.40 cm     PULMONARY VEINS RV Mid diam:    2.20 cm     A Reversal Duration: 113.00 msec RV S prime:     16.50 cm/s  A Reversal Velocity: 27.70 cm/s TAPSE (M-mode): 2.4 cm      Diastolic Velocity:  61.60 cm/s                             S/D Velocity:        1.30                             Systolic Velocity:   73.71 cm/s LEFT ATRIUM             Index LA diam:        2.90 cm 1.56 cm/m LA Vol (A2C):   53.3 ml 28.59 ml/m LA Vol (A4C):   51.4 ml 27.57 ml/m LA Biplane Vol: 55.2 ml 29.60 ml/m  AORTIC VALVE                   PULMONIC VALVE AV Area (Vmax):    1.58 cm    PV Vmax:       0.72 m/s AV Area (Vmean):   1.64 cm    PV Vmean:      57.200 cm/s AV Area (VTI):     1.57 cm    PV VTI:        0.209 m AV  Vmax:           142.00 cm/s PV Peak grad:  2.0 mmHg AV Vmean:          88.100 cm/s PV Mean grad:  1.0 mmHg AV VTI:            0.359 m AV Peak Grad:      8.1 mmHg AV Mean Grad:      4.0 mmHg LVOT Vmax:  88.40 cm/s LVOT Vmean:        56.700 cm/s LVOT VTI:          0.222 m LVOT/AV VTI ratio: 0.62 AI PHT:            754 msec  AORTA Ao Root diam: 3.50 cm Ao Asc diam:  3.40 cm MITRAL VALVE MV Area (PHT): 3.17 cm    SHUNTS MV Area VTI:   1.46 cm    Systemic VTI:  0.22 m MV Peak grad:  5.4 mmHg    Systemic Diam: 1.80 cm MV Mean grad:  1.0 mmHg MV Vmax:       1.16 m/s MV Vmean:      50.8 cm/s MV Decel Time: 239 msec MV E velocity: 68.10 cm/s MV A velocity: 99.80 cm/s MV E/A ratio:  0.68 Mihai Croitoru MD Electronically signed by Sanda Klein MD Signature Date/Time: 11/04/2020/3:40:47 PM    Final    VAS US CAROTID (at [facility] and [facility] only)  Result Date: 11/05/2020 Carotid Arterial Duplex Study Patient Name:  BRYNLI OLLIS  Date of Exam:   11/05/2020 Medical Rec #: [id number]     Accession #:    [id number] Date of Birth: November 17, 1955     Patient Gender: F Patient Age:   65Y Exam Location:  [facility] Procedure:      VAS US CAROTID Referring Phys: [id number] TIMOTHY S OPYD --------------------------------------------------------------------------------  Indications:       CVA. Risk Factors:      Hypertension, Diabetes, past history of smoking, coronary                    artery disease, prior CVA. Other Factors:     COPD, CKD3. Comparison Study:  Previous exam 06/26/20 - WNL Performing Technologist: Rogelia Rohrer  Examination Guidelines: A complete evaluation includes B-mode imaging, spectral Doppler, color Doppler, and power Doppler as needed of all accessible portions of each vessel. Bilateral testing is considered an integral part of a complete examination. Limited examinations for reoccurring indications may be performed as noted.  Right Carotid Findings:  +----------+--------+--------+--------+------------------+------------------+           PSV cm/sEDV cm/sStenosisPlaque DescriptionComments           +----------+--------+--------+--------+------------------+------------------+ CCA Prox  54      12                                intimal thickening +----------+--------+--------+--------+------------------+------------------+ CCA Distal39      14                                intimal thickening +----------+--------+--------+--------+------------------+------------------+ ICA Prox  42      15                                intimal thickening +----------+--------+--------+--------+------------------+------------------+ ICA Distal70      20                                                   +----------+--------+--------+--------+------------------+------------------+ ECA       27      6                                                    +----------+--------+--------+--------+------------------+------------------+ +----------+--------+-------+--------+-------------------+  PSV cm/sEDV cmsDescribeArm Pressure (mmHG) +----------+--------+-------+--------+-------------------+ [id number]                                         +----------+--------+-------+--------+-------------------+ +---------+--------+--+--------+--+---------+ VertebralPSV cm/s40EDV cm/s11Antegrade +---------+--------+--+--------+--+---------+  Left Carotid Findings: +----------+--------+--------+--------+------------------+------------------+           PSV cm/sEDV cm/sStenosisPlaque DescriptionComments           +----------+--------+--------+--------+------------------+------------------+ CCA Prox  58      16                                intimal thickening +----------+--------+--------+--------+------------------+------------------+ CCA Distal50      15                                intimal thickening  +----------+--------+--------+--------+------------------+------------------+ ICA Prox  43      16                                intimal thickening +----------+--------+--------+--------+------------------+------------------+ ICA Distal50      19                                                   +----------+--------+--------+--------+------------------+------------------+ ECA       25      6                                                    +----------+--------+--------+--------+------------------+------------------+ +----------+--------+--------+--------+-------------------+           PSV cm/sEDV cm/sDescribeArm Pressure (mmHG) +----------+--------+--------+--------+-------------------+ [id number]                                          +----------+--------+--------+--------+-------------------+ +---------+--------+--+--------+--+---------+ VertebralPSV cm/s36EDV cm/s11Antegrade +---------+--------+--+--------+--+---------+   Summary: Right Carotid: The extracranial vessels were near-normal with only minimal wall                thickening or plaque. Left Carotid: The extracranial vessels were near-normal with only minimal wall               thickening or plaque. Vertebrals: Bilateral vertebral arteries demonstrate antegrade flow. *See table(s) above for measurements and observations.  Electronically signed by Servando Snare MD on 11/05/2020 at 6:01:44 PM.    Final    LONG TERM MONITOR-LIVE TELEMETRY (3-14 DAYS)  Result Date: 11/15/2020 14 day extended Holter monitor: Dominant rhythm normal sinus rhythm Heart rate 44-179 bpm.  Avg HR 69 bpm. No atrial fibrillation, high grade AV block, pauses (3 seconds or longer). 1 episode of nonsustained ventricular tachycardia, 13 beats in duration, max heart rate of 179 bpm. 1 episode of supraventricular tachycardia, 6 beats in duration, maximum heart rate 164bpm. Total ventricular ectopic burden <1%. Total supraventricular ectopic  burden <1%. Patient triggered events: 0.   Labs:  Basic Metabolic Panel: Recent Labs  Lab 11/17/20 0501  NA 137  K  4.4  CL 109  CO2 21*  GLUCOSE 119*  BUN 46*  CREATININE 1.74*  CALCIUM 9.3    CBC: Recent Labs  Lab 11/17/20 0501  WBC 3.6*  HGB 10.9*  HCT 31.3*  MCV 93.4  PLT 154    CBG: Recent Labs  Lab 11/18/20 2032 11/19/20 0611 11/19/20 1141 11/19/20 1657 11/19/20 2048  GLUCAP 176* 140* 117* 170* 166*   Family history.  Father with hypertension and CVA and diabetes mellitus.  Mother with CAD hypertension hyperlipidemia.  Sister and brother with hypertension.  Denies any colon cancer or esophageal cancer  Brief HPI:   Erica [name] is a 65 y.o. 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  Per chart review lives with her children.  Independent with assistive device.  1 level home with level entry.  Good supportive family.  Presented 11/03/2020 with persistent headache and left-sided weakness x3 days.  MRI showed punctate acute infarction at the medial right parieto-occipital junction.  Chronic small vessel ischemic changes.  MRA of the head negative for large vessel occlusion.  MRI cervical spine with persistent patchy signal abnormality involving the central dorsal aspect of the cervical spinal cord extending from C2-C7-T1 relatively stable and unchanged from previous tracings.  Admission chemistries unremarkable except chloride 113 creatinine 1.70 hemoglobin 9.2 hemoglobin A1c 6.3.  Echocardiogram with ejection fraction of 60 to 19% grade 1 diastolic dysfunction.  Maintained on aspirin and Plavix for CVA prophylaxis x3 weeks and aspirin alone.  Subcutaneous heparin for DVT prophylaxis.  Therapy evaluations completed due to patient's left-sided weakness was  admitted for a comprehensive rehab program.   Hospital Course: Erica [name] was admitted to rehab 11/06/2020 for inpatient therapies to consist of PT, ST and OT at least three hours five days a week. Past admission physiatrist, therapy team and rehab RN have worked together to provide customized collaborative inpatient rehab.  Pertaining to patient's punctate parietal occipital infarction in the setting of prior right thalamic caudate infarction remained stable.  She had declined loop recorder.  She would follow-up neurology services.  Remained on aspirin and Plavix x3 weeks total then aspirin alone.  Pain managed with use of Voltaren gel as needed as well as Neurontin as needed.  Tramadol was added for headaches.  Mood stabilization with Effexor BuSpar as well as trazodone and emotional support provided.  Blood pressure controlled on Cozaar as well as Norvasc's she would need outpatient follow-up.  Idiopathic pulmonary fibrosis COPD she remained on pirfenidone as directed.  Oxygen saturations greater than 90%.  Blood sugars overall controlled Lantus insulin as indicated full diabetic teaching.  CKD stage III latest creatinine 1.73-1.74 encouragement of fluids follow-up labs outpatient.  Hyperlipidemia with Crestor as directed.  History of GERD with Pepcid as indicated.  Pancytopenia WBC 3.1 hemoglobin 10.3-10.9 platelets 146-154 and monitored.   Blood pressures were monitored on TID basis and controlled  Diabetes has been monitored with ac/hs CBG checks and SSI was use prn for tighter BS control.    Rehab course: During patient's stay in rehab weekly team conferences were held to monitor patient's progress, set goals and discuss barriers to discharge. At admission, patient required moderate assist ambulate minimal assist General transfers minimal guard sit to supine supervision upper body bathing minimal assist lower body bathing set up upper body dressing minimal assist  lower body dressing  Physical  exam.  Blood pressure 165/81 pulse 84 temperature 98.4 respirations 18 oxygen saturation 97% room air Constitutional.  No acute distress HEENT Head.  Normocephalic and atraumatic Eyes.  Pupils round and reactive to light no discharge without nystagmus Neck.  Supple nontender no JVD without thyromegaly Cardiac regular rate rhythm without extra sounds or murmur heard Abdomen.  Soft nontender positive bowel sounds without rebound Respiratory effort normal no respiratory distress without wheeze Skin.  Warm and dry Neurologic.  Alert makes eye contact with examiner oriented to person and place.  Follows commands.  Right upper and right lower extremity 4/5.  Left upper extremity 3+ to 4/5 with decreased [place], left lower extremity 4 -/5.  Sensation 1/2 left upper extremity, 1+/2 left lower extremity.  Fair insight and awareness  He/She  has had improvement in activity tolerance, balance, postural control as well as ability to compensate for deficits. He/She has had improvement in functional use RUE/LUE  and RLE/LLE as well as improvement in awareness.  Patient sat edge of bed to don shoes contact-guard.  Patient sit to stand throughout session with supervision ambulates with rolling walker supervision 130 feet.  Educated throughout sessions on patient's safety.  Completed all mobility ADLs supine to sit sit to stand and ambulation rolling walker at close supervision level.  Patient completed written problem-solving task requiring her to follow two-step directions minimal assist for air awareness in detail.  She completed medication management task with pill organizer and min mod assist.  Her family was instructed on need for supervision and medication assistance.  Full family teaching completed plan discharged to home       Disposition: Discharged to home    Diet: Regular  Special Instructions: No driving smoking or alcohol  Medications at discharge 1.  Tylenol as needed 2.  Ventolin inhaler 2  puffs every 4 hours as needed 3.  Norvasc 10 mg p.o. daily 4.  BuSpar 10 mg p.o. twice daily 5.  Voltaren gel 2 g daily as needed knee pain 6.  Pepcid 20 mg p.o. daily 7.  Neurontin 200 mg p.o. twice daily as needed 8.  Lantus insulin 22 units at 08 120 units at 2000 9.  Cozaar 50 mg p.o. daily 10.  Dulera 2 puffs twice daily 11.  Protonix 20 mg p.o. daily 12.Pirfenidone 801 mg p.o. 3 times daily 13.  Crestor 20 mg p.o. daily 14.  Tramadol 50 mg every 8 hours as needed pain 15.  Trazodone 50 mg p.o. nightly 16.  Ellipta 1 puff daily 17.  Effexor 75 mg p.o. daily 18.  Aspirin 81 mg p.o. daily 19.  Plavix 75 mg p.o. daily x7 more days and stop  30-35 minutes were spent completing discharge summary and discharge planning   Discharge Instructions     Ambulatory referral to Neurology   Complete by: As directed    An appointment is requested in approximately 4 weeks parietal lobe infarction   Ambulatory referral to Physical Medicine Rehab   Complete by: As directed    Moderate complexity follow-up 1 to 2 weeks punctate parietal occipital infarction        Follow-up Information     Jamse Arn, MD Follow up.   Specialty: Physical Medicine and Rehabilitation Why: Office to call for appointment Contact information: [address] [place] [address] [phone]                 Signed: Lavon Paganini Megann Easterwood  11/20/2020, 5:29 AM
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
## 2020-11-19 NOTE — Progress Notes (Signed)
Physical Therapy Session Note  Patient Details  Name: Erica [name] MRN: [id number] Date of Birth: August 09, 1955  Today's Date: 11/19/2020 PT Individual Time: 0900-0940 PT Individual Time Calculation (min): 40 min   Short Term Goals: Week 2:  PT Short Term Goal 1 (Week 2): STG=LTG due to ELOS  Skilled Therapeutic Interventions/Progress Updates:    Pt greeted supine in bed to start PT tx - she's agreeable with convincing as she c/o nausea from medications. Extra time needed to participate. Supine<>sit completed with supervision. Discussed DC planning but pt demonstrating limited interaction or engagement with flat affect throughout session. She was able to remove hospital socks while seated EOB to don knee-high TED's which she completed without assist. She completed sit<>stand transfers with supervision to RW and performed ambulatory transfer with close supervision and RW to w/c. Wheeled downstairs for time management to ADL apartment room. Instructed pt to locate 3 items in [place] - she was able to recall 2/3 items but was able to locate all 3 items without cues. Ambulated within ADL apartment with close supervision and RW. She practiced sofa couch furniture transfers with supervision and RW with increased difficulty noted but capable with ++ time and effort. Next, practiced stepping over doorway thresholds (3inches) x4 times to replicate home entry and practical home environments. Pt reporting fatigue and requesting to return upstairs. Wheeled back upstairs with totalA for energy conservation and then completed stand<>pivot transfer with supervision and RW to EOB from w/c. Bed mobility completed without assist with HOB nearly flat. Bed alarm on and all needs within reach at end of session.  Therapy Documentation Precautions:  Precautions Precautions: Fall Precaution Comments: mild L hemi Restrictions Weight Bearing Restrictions: No General:     Therapy/Group: Individual Therapy  [name] 11/19/2020, 7:37 AM

## 2020-11-19 NOTE — Progress Notes (Signed)
Inpatient Rehabilitation Care Coordinator Discharge Note  The overall goal for the admission was met for: DC SAT 6/11  Discharge location: Yes-HOME WITH SON WHO IS THERE BUT DOES NOT ASSIST-SISTER COMES AND ASSISTS NOT DAILY THOUGH  Length of Stay: Yes-15 DAYS  Discharge activity level: Yes-SUPERVISION LEVEL  Home/community participation: Yes  Services provided included: MD, RD, PT, OT, SLP, RN, CM, Pharmacy, and SW  Financial Services: Medicaid and Private Insurance: [place] offered to/list presented to:PT AND SISTER  Follow-up services arranged: Home Health: [place] and Patient/Family request agency HH: [facility], DME: NO NEEDS HAS FROM PREVIOUS ADMIT  Comments (or additional information):SISTER WAS HERE LAST TIME TO ATTEND THERAPIES WITH PT. AWARE OF THE RECOMMENDATION OF 24/7 SUPERVISION. HAS OFFERED FOR PT TO STAY WITH HER BUT PT DECLINED THIS, WANTS TO BE AT HOME. SON IS THERE BUT DOES NOT ASSIST  Patient/Family verbalized understanding of follow-up arrangements: Yes  Individual responsible for coordination of the follow-up plan: [name]-SISTER [phone]  Confirmed correct DME delivered: Elease Hashimoto 11/19/2020    , Gardiner Rhyme

## 2020-11-19 NOTE — Progress Notes (Signed)
Recreational Therapy Session Note  Patient Details  Name: Erica [name] MRN: [id number] Date of Birth: [birth year] Today's Date: 11/19/2020  Pain: no c/o Skilled Therapeutic Interventions/Progress Updates: Session focused on discharge planning, community reintegration, activity analysis with potential modifications.  Pt able to identify 3 potential leisure activities for at home participation and identified energy conservation techniques and safety concerns for community pursuits with min questioning cues.    Therapy/Group: Individual Therapy  Akiva Josey 11/19/2020, 4:17 PM

## 2020-11-20 LAB — GLUCOSE, CAPILLARY
Glucose-Capillary: 62 mg/dL — ABNORMAL LOW (ref 70–99)
Glucose-Capillary: 67 mg/dL — ABNORMAL LOW (ref 70–99)
Glucose-Capillary: 81 mg/dL (ref 70–99)
Glucose-Capillary: 112 mg/dL — ABNORMAL HIGH (ref 70–99)
Glucose-Capillary: 146 mg/dL — ABNORMAL HIGH (ref 70–99)
Glucose-Capillary: 200 mg/dL — ABNORMAL HIGH (ref 70–99)

## 2020-11-20 LAB — BASIC METABOLIC PANEL
Anion gap: 8 (ref 5–15)
BUN: 36 mg/dL — ABNORMAL HIGH (ref 8–23)
CO2: 19 mmol/L — ABNORMAL LOW (ref 22–32)
Calcium: 9 mg/dL (ref 8.9–10.3)
Chloride: 108 mmol/L (ref 98–111)
Creatinine, Ser: 1.78 mg/dL — ABNORMAL HIGH (ref 0.44–1.00)
GFR, Estimated: 31 mL/min — ABNORMAL LOW (ref >60)
Glucose, Bld: 120 mg/dL — ABNORMAL HIGH (ref 70–99)
Potassium: 4.4 mmol/L (ref 3.5–5.1)
Sodium: 135 mmol/L (ref 135–145)

## 2020-11-20 MED ORDER — PIRFENIDONE 267 MG PO TABS: 3.0000 | ORAL_TABLET | Freq: Three times a day (TID) | ORAL | 11 refills | Status: DC

## 2020-11-20 MED ORDER — SPIRIVA RESPIMAT 1.25 MCG/ACT IN AERS: 2.0000 | INHALATION_SPRAY | Freq: Every day | RESPIRATORY_TRACT | 5 refills | Status: DC

## 2020-11-20 MED ORDER — INSULIN GLARGINE 100 UNITS/ML SOLOSTAR PEN: PEN_INJECTOR | SUBCUTANEOUS | 11 refills | Status: DC

## 2020-11-20 MED ORDER — ACETAMINOPHEN 325 MG PO TABS: 650.0000 mg | ORAL_TABLET | ORAL | Status: DC | PRN

## 2020-11-20 MED ORDER — LOSARTAN POTASSIUM 50 MG PO TABS: 50.0000 mg | ORAL_TABLET | Freq: Every day | ORAL | 0 refills | Status: DC

## 2020-11-20 MED ORDER — INSULIN GLARGINE 100 UNIT/ML SOLOSTAR PEN: 28.0000 [IU] | PEN_INJECTOR | Freq: Two times a day (BID) | SUBCUTANEOUS | 2 refills | Status: DC

## 2020-11-20 MED ORDER — GABAPENTIN 100 MG PO CAPS: 200.0000 mg | ORAL_CAPSULE | Freq: Two times a day (BID) | ORAL | 0 refills | Status: DC | PRN

## 2020-11-20 MED ORDER — DICLOFENAC SODIUM 1 % EX GEL: 2.0000 g | Freq: Every day | CUTANEOUS | 0 refills | Status: DC | PRN

## 2020-11-20 MED ORDER — VENLAFAXINE HCL ER 75 MG PO CP24: 75.0000 mg | ORAL_CAPSULE | Freq: Every day | ORAL | 0 refills | Status: DC

## 2020-11-20 MED ORDER — LANTUS SOLOSTAR 100 UNIT/ML ~~LOC~~ SOPN: PEN_INJECTOR | SUBCUTANEOUS | 11 refills | Status: DC

## 2020-11-20 MED ORDER — AMLODIPINE BESYLATE 10 MG PO TABS: 10.0000 mg | ORAL_TABLET | Freq: Every day | ORAL | 2 refills | Status: DC

## 2020-11-20 MED ORDER — TRAMADOL HCL 50 MG PO TABS: 50.0000 mg | ORAL_TABLET | Freq: Three times a day (TID) | ORAL | 0 refills | Status: DC | PRN

## 2020-11-20 MED ORDER — TRAZODONE HCL 50 MG PO TABS: 50.0000 mg | ORAL_TABLET | Freq: Every day | ORAL | 0 refills | Status: DC

## 2020-11-20 MED ORDER — ROSUVASTATIN CALCIUM 20 MG PO TABS: 20.0000 mg | ORAL_TABLET | Freq: Every day | ORAL | 0 refills | Status: DC

## 2020-11-20 MED ORDER — FAMOTIDINE 20 MG PO TABS: 20.0000 mg | ORAL_TABLET | Freq: Every day | ORAL | 0 refills | Status: DC

## 2020-11-20 MED ORDER — SYMBICORT 160-4.5 MCG/ACT IN AERO: 2.0000 | INHALATION_SPRAY | Freq: Two times a day (BID) | RESPIRATORY_TRACT | 5 refills | Status: DC

## 2020-11-20 MED ORDER — PEN NEEDLES 32G X 6 MM MISC: 1.0000 "application " | Freq: Two times a day (BID) | 1 refills | Status: DC

## 2020-11-20 MED ORDER — BUSPIRONE HCL 10 MG PO TABS: 10.0000 mg | ORAL_TABLET | Freq: Two times a day (BID) | ORAL | 0 refills | Status: DC

## 2020-11-20 MED ORDER — ALBUTEROL SULFATE HFA 108 (90 BASE) MCG/ACT IN AERS: 2.0000 | INHALATION_SPRAY | RESPIRATORY_TRACT | 5 refills | Status: DC | PRN

## 2020-11-20 MED ORDER — CLOPIDOGREL BISULFATE 75 MG PO TABS: 75.0000 mg | ORAL_TABLET | Freq: Every day | ORAL | 0 refills | Status: DC

## 2020-11-20 NOTE — Progress Notes (Signed)
Speech Language Pathology Discharge Summary  Patient Details  Name: Erica [name] MRN: [id number] Date of Birth: 04/20/1956  Today's Date: 11/20/2020 SLP Individual Time: 5277-8242 SLP Individual Time Calculation (min): 20 min   Skilled Therapeutic Interventions:  Patient seen for skilled ST session focusing on cognitive goals. Patient in bed and required frequent cues to elaborate on responses as her affect was flat. When asked if she was upset, patient said "Im tired and youre late". SLP inadvertently switched therapy times with patient and another patient down the hall. She participated in brief discussion of progress, anticipation of needs and assistance at home however session was ended early secondary to patient's fatigue and poor participation overall.      Patient has met 1 of 2 long term goals.  Patient to discharge at overall Supervision;Min level.  Reasons goals not met: Patient continues to require at least minA for complex level problem solving   Clinical Impression/Discharge Summary: Patient met 1/2 STG's and at time of discharge is at supervision to [facility] level for mildly complex problem solving and reasoning. Patient exhibits deficits in insight needs and level of assistance and in addition she does appear somewhat apathetic. She continues to report stress from son's mental health issues and fact that he lives at home with her but does not help her much at all. Patient was in [place] in Janurary of 2022 and currently, she appears to be at same cognitive level as she was then.  Care Partner:  Caregiver Able to Provide Assistance: Other (comment) (son at home with patient but he reportedly does not assist her at all. Patient's sister does come to help but not there 24 hours)     Recommendation:  Home Health SLP  Rationale for SLP Follow Up: Reduce caregiver burden;Maximize cognitive function and independence   Equipment: N/A   Reasons for discharge: Discharged from hospital    Patient/Family Agrees with Progress Made and Goals Achieved: Yes    Sonia Baller, MA, CCC-SLP Speech Therapy

## 2020-11-20 NOTE — Significant Event (Signed)
Hypoglycemic Event  CBG: 62  Treatment: 4 oz juice/soda  Symptoms: None  Follow-up CBG: [id number] CBG Result:81  Possible Reasons for Event: Unknown  Comments/MD notified no  Erica [name]

## 2020-11-20 NOTE — Plan of Care (Signed)
  Problem: RH Balance Goal: LTG Patient will maintain dynamic sitting balance (PT) Description: LTG:  Patient will maintain dynamic sitting balance with assistance during mobility activities (PT) Outcome: Completed/Met Goal: LTG Patient will maintain dynamic standing balance (PT) Description: LTG:  Patient will maintain dynamic standing balance with assistance during mobility activities (PT) Outcome: Completed/Met   Problem: Sit to Stand Goal: LTG:  Patient will perform sit to stand with assistance level (PT) Description: LTG:  Patient will perform sit to stand with assistance level (PT) Outcome: Completed/Met   Problem: RH Bed Mobility Goal: LTG Patient will perform bed mobility with assist (PT) Description: LTG: Patient will perform bed mobility with assistance, with/without cues (PT). Outcome: Completed/Met   Problem: RH Bed to Chair Transfers Goal: LTG Patient will perform bed/chair transfers w/assist (PT) Description: LTG: Patient will perform bed to chair transfers with assistance (PT). Outcome: Completed/Met   Problem: RH Car Transfers Goal: LTG Patient will perform car transfers with assist (PT) Description: LTG: Patient will perform car transfers with assistance (PT). Outcome: Completed/Met   Problem: RH Ambulation Goal: LTG Patient will ambulate in controlled environment (PT) Description: LTG: Patient will ambulate in a controlled environment, # of feet with assistance (PT). Outcome: Completed/Met Goal: LTG Patient will ambulate in home environment (PT) Description: LTG: Patient will ambulate in home environment, # of feet with assistance (PT). Outcome: Completed/Met  Magda Kiel, PT
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
## 2020-11-20 NOTE — Significant Event (Signed)
Hypoglycemic Event  CBG: 67  Treatment: 4 oz juice/soda  Symptoms: None  Follow-up CBG: Time:0608 CBG Result:62  Possible Reasons for Event: Unknown  Comments/MD notified:no    Erica [name]

## 2020-11-20 NOTE — Plan of Care (Signed)
Problem: RH Functional Use of Upper Extremity Goal: LTG Patient will use RT/LT upper extremity as a (OT) Description: LTG: Patient will use right/left upper extremity as a stabilizer/gross assist/diminished/nondominant/dominant level with assist, with/without cues during functional activity (OT) Outcome: Completed/Met   Problem: RH Tub/Shower Transfers Goal: LTG Patient will perform tub/shower transfers w/assist (OT) Description: LTG: Patient will perform tub/shower transfers with assist, with/without cues using equipment (OT) Outcome: Completed/Met   Problem: RH Memory Goal: LTG Patient will demonstrate ability for day to day recall/carry over during activities of daily living with assistance level (OT) Description: LTG:  Patient will demonstrate ability for day to day recall/carry over during activities of daily living with assistance level (OT). Outcome: Completed/Met Note: Pt often requires visual cues to remember next tasks or what has been done in a session (mod I).   Problem: RH Dressing Goal: LTG Patient will perform lower body dressing w/assist (OT) Description: LTG: Patient will perform lower body dressing with assist, with/without cues in positioning using equipment (OT) Outcome: Not Met (add Reason) Note: Due to balance limitations and vestibular deficits, recommend supervision for LB dressing in stance. May occasionally require min A due to weakness in LLE.   Problem: RH Balance Goal: LTG Patient will maintain dynamic standing with ADLs (OT) Description: LTG:  Patient will maintain dynamic standing balance with assist during activities of daily living (OT)  Outcome: Adequate for Discharge Note: Due to balance limitations and vestibular deficits, recommend supervision for dynamic balance tasks.   Problem: Sit to Stand Goal: LTG:  Patient will perform sit to stand in prep for activites of daily living with assistance level (OT) Description: LTG:  Patient will perform sit to  stand in prep for activites of daily living with assistance level (OT) Outcome: Adequate for Discharge Note: Due to balance limitations and vestibular deficits, recommend supervision for sit<>stand.   Problem: RH Eating Goal: LTG Patient will perform eating w/assist, cues/equip (OT) Description: LTG: Patient will perform eating with assist, with/without cues using equipment (OT) Outcome: Adequate for Discharge Note: While pt is independent for feeding, recommend set up A at d/c.   Problem: RH Grooming Goal: LTG Patient will perform grooming w/assist,cues/equip (OT) Description: LTG: Patient will perform grooming with assist, with/without cues using equipment (OT) Outcome: Adequate for Discharge Note: Due to balance limitations and vestibular deficits, recommend supervision if standing; can perform mod I if seated in w/c or seat.   Problem: RH Bathing Goal: LTG Patient will bathe all body parts with assist levels (OT) Description: LTG: Patient will bathe all body parts with assist levels (OT) Outcome: Adequate for Discharge Note: Due to balance limitations and vestibular deficits, recommend supervision for bathing in stance; can perform with set up if from EOB level.   Problem: RH Dressing Goal: LTG Patient will perform upper body dressing (OT) Description: LTG Patient will perform upper body dressing with assist, with/without cues (OT). Outcome: Adequate for Discharge Note: Due to balance limitations and vestibular deficits, recommend set up A for UB dressing.   Problem: RH Toileting Goal: LTG Patient will perform toileting task (3/3 steps) with assistance level (OT) Description: LTG: Patient will perform toileting task (3/3 steps) with assistance level (OT)  Outcome: Adequate for Discharge Note: Due to balance limitations and vestibular deficits, recommend supervision for toileting.   Problem: RH Toilet Transfers Goal: LTG Patient will perform toilet transfers w/assist  (OT) Description: LTG: Patient will perform toilet transfers with assist, with/without cues using equipment (OT) Outcome: Adequate for Discharge Note: Due  to balance limitations, vestibular deficits, and LLE knee buckling, recommend supervision for toilet transfers.

## 2020-11-20 NOTE — Progress Notes (Addendum)
PROGRESS NOTE   Subjective/Complaints: Intermittent headaches but they're manageable. Son tends to be one who adds some stress to her life. Called about dog with tick today, wanted to know what to do  ROS: Patient denies fever, rash, sore throat, blurred vision, nausea, vomiting, diarrhea, cough, shortness of breath or chest pain, joint or back pain,  or mood change.   Objective:   No results found. No results for input(s): WBC, HGB, HCT, PLT in the last 72 hours.  No results for input(s): NA, K, CL, CO2, GLUCOSE, BUN, CREATININE, CALCIUM in the last 72 hours.   Intake/Output Summary (Last 24 hours) at 11/20/2020 1240 Last data filed at 11/20/2020 0852 Gross per 24 hour  Intake 620 ml  Output --  Net 620 ml        Physical Exam: Vital Signs Blood pressure 131/81, pulse 66, temperature 97.8 F (36.6 C), temperature source Oral, resp. rate 14, weight 82.3 kg, SpO2 100 %. Constitutional: No distress . Vital signs reviewed. HEENT: EOMI, oral membranes moist Neck: supple Cardiovascular: RRR without murmur. No JVD    Respiratory/Chest: CTA Bilaterally without wheezes or rales. Normal effort    GI/Abdomen: BS +, non-tender, non-distended Ext: no clubbing, cyanosis, or edema Psych: pleasant and cooperative Musc: No edema in extremities.  No tenderness in extremities. Neurological: Alert. Fair insight and awareness. Normal language Motor: RUE/RLE: 4+/5 proximal distal LUE: 4-4+/5 proximal distal, stable LLE: Hip flexion, knee extension 4/5, ankle dorsiflexion 4+/5, stable Reasonable standing balance, good transfer technique  Assessment/Plan: 1. Functional deficits which require 3+ hours per day of interdisciplinary therapy in a comprehensive inpatient rehab setting. Physiatrist is providing close team supervision and 24 hour management of active medical problems listed below. Physiatrist and rehab team continue to assess  barriers to discharge/monitor patient progress toward functional and medical goals  Care Tool:  Bathing    Body parts bathed by patient: Right arm, Left arm, Chest, Abdomen, Front perineal area, Buttocks, Right upper leg, Left upper leg, Right lower leg, Left lower leg, Face         Bathing assist Assist Level: Supervision/Verbal cueing     Upper Body Dressing/Undressing Upper body dressing   What is the patient wearing?: Pull over shirt, Bra    Upper body assist Assist Level: Set up assist    Lower Body Dressing/Undressing Lower body dressing      What is the patient wearing?: Underwear/pull up, Pants     Lower body assist Assist for lower body dressing: Supervision/Verbal cueing     Toileting Toileting    Toileting assist Assist for toileting: Supervision/Verbal cueing     Transfers Chair/bed transfer  Transfers assist     Chair/bed transfer assist level: Supervision/Verbal cueing     Locomotion Ambulation   Ambulation assist      Assist level: Supervision/Verbal cueing Assistive device: Walker-rolling Max distance: 150'   Walk 10 feet activity   Assist     Assist level: Supervision/Verbal cueing Assistive device: Walker-rolling   Walk 50 feet activity   Assist    Assist level: Supervision/Verbal cueing Assistive device: Walker-rolling    Walk 150 feet activity   Assist Walk 150 feet activity  did not occur: Safety/medical concerns  Assist level: Supervision/Verbal cueing Assistive device: Walker-rolling    Walk 10 feet on uneven surface  activity   Assist Walk 10 feet on uneven surfaces activity did not occur: Safety/medical concerns   Assist level: Contact Guard/Touching assist Assistive device: Aeronautical engineer Will patient use wheelchair at discharge?: No Type of Wheelchair: Manual    Wheelchair assist level: Supervision/Verbal cueing Max wheelchair distance: 40'    Wheelchair 50 feet  with 2 turns activity    Assist        Assist Level: Minimal Assistance - Patient > 75%   Wheelchair 150 feet activity     Assist          BP 131/81 (BP Location: Left Arm)   Pulse 66   Temp 97.8 F (36.6 C) (Oral)   Resp 14   Wt 82.3 kg   SpO2 100%   BMI 32.14 kg/m    Medical Problem List and Plan: 1.  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  Patient declined  loop recorder  -Continue CIR therapies including PT, OT   -Interdisciplinary Team Conference today    -ELOS 6/11. Home with family helping at house  -f/u with PMR clinic in 4 weeks 2.  Antithrombotics: -DVT/anticoagulation: Subcutaneous heparin             -antiplatelet therapy: Aspirin 81 mg daily and Plavix 75 mg daily x 3 weeks then aspirin alone 3. Pain Management: Voltaren gel as needed Neurontin 300 mg twice daily as needed  6/10 continue tramadol PRN for severe headache   -have discussed stress mgt strategies at length 4. Depression: Continue Effexor 75 mg daily, BuSpar 10 mg twice daily, trazodone 50 mg nightly             -antipsychotic agents: N/A 5. Neuropsych: This patient is capable of making decisions on her own behalf. 6. Skin/Wound Care: Routine skin checks 7. Fluids/Electrolytes/Nutrition: Routine in and outs   8.  Hypertension.  Cozaar 50 mg daily  Norvasc 5 mg daily increased to 7.5 on 6/3  6/10 bp better on norvasc 58m daily 9.  Idiopathic pulmonary fibrosis/COPD.Pirfenidone 801 mg 3 times daily, continue inhalers  -lungs remain clear, symptoms controlled at present 10.  Diabetes mellitus with hyperglycemia.    Lantus insulin 25 units twice daily, decreased to 20 BID on 6/1.   Check blood sugars before meals and at bedtime              CBG (last 3)  Recent Labs    11/20/20 0608 11/20/20 0622 11/20/20 1122  GLUCAP 62* 81 112*    Bedtime snack ordered-discussed with nursing  Labile  on 6/30, will likely require NovoLog however will not make any changes today given recent adjustment  Better controlled 6/6 but still higher in PM   6/10 low am CBG Adjust lantus to 24u qam and 16 u qpm   11.  CKD stage III.    BUN up to 46 on last check ,Cr 1.74  - bmet today 6/10 down to 36. Continue to push fluids 12.  History of rectal cancer.  Follow-up outpatient 13.  Hyperlipidemia.  Crestor 14.  GERD.  Pepcid 15. Nausea/vomiting  Added phenergan 12.5 mg q6 hours prn  Improved 16.  Pancytopenia  WBCs 3.1 on 6/3-->3.6  Hemoglobin 10.3 on 6/3--> 10.9  Platelets 146 on 6/3--> 154k  17.  GERD  Protonix ordered  LOS: 14 days A FACE TO FACE EVALUATION WAS PERFORMED  Erica [name] 11/20/2020, 12:40 PM

## 2020-11-20 NOTE — Progress Notes (Signed)
Physical Therapy Discharge Summary  Patient Details  Name: Erica [name] MRN: [id number] Date of Birth: 25-May-1956  Today's Date: 11/20/2020 PT Individual Time: 1530-1600 PT Individual Time Calculation (min): 30 min   Skilled Therapeutic Interventions/Progress Updates:  Patient in supine and reports tired from sessions earlier today.  Agreeable to therex for HEP prescription.  In supine performed bridging with 3 sec hold x 10 and SLR x 10.  Supine to sit mod I and pt stood to RW S and ambulated to sink to perform hip abduction, hamstring curls, mini squats and heel raises x 5-10 reps each.  Discussed making handout, but pt reports has listed HEP from previous HHPT taped to cabinets in her kitchen by the counter.  Discussed not giving new ones and letting HHPT address any changes needed.  Issued information on fall prevention and reviewed with pt for floors, footwear, placement of frequently used items, lighting, shower safety, etc.  Patient verbalized understanding.  Patient reports she had conversation with her son about his needing to finish class and help out or else she would need to find him his own place.  Reports they either move forward together with him helping or they have to separate.  Patient assisted to bathroom and toileted with distant S.  Patient left seated EOB with call bell and needs in reach and bed alarm active.   Patient has met 8 of 8 long term goals due to improved activity tolerance, improved balance, increased strength, and improved attention.  Patient to discharge at an ambulatory level Supervision.   Patient's care partner unavailable to provide the necessary physical and cognitive assistance at discharge.  Patient's son who lives with pt not able to come in for training.   Reasons goals not met: Goals met  Recommendation:  Patient will benefit from ongoing skilled PT services in home health setting to continue to advance safe functional mobility, address ongoing  impairments in balance, L LE coordination/endurance, safety awareness and vestibular issues, and minimize fall risk.  Equipment: No equipment provided  Reasons for discharge: treatment goals met and discharge from hospital  Patient/family agrees with progress made and goals achieved: Yes  PT Discharge Precautions/Restrictions Precautions Precautions: Fall Precaution Comments: mild L hemi Restrictions Weight Bearing Restrictions: No  Pain Pain Assessment Pain Scale: 0-10 Pain Score:  (unrated) Pain Type: Acute pain Pain Location: Head Pain Orientation: Right Pain Descriptors / Indicators: Aching;Headache Pain Onset: Gradual (with stress) Pain Intervention(s): Distraction;Rest;Emotional support Vision/Perception  Vision - Assessment Additional Comments: Vision limitations due to vestibular deficits; pt provided with exercises to practice but continues to require seated rest breaks during head movement activities in stance/ambulation Perception Perception: Within Functional Limits Praxis Praxis: Intact  Cognition Overall Cognitive Status: Impaired/Different from baseline Arousal/Alertness: Awake/alert Orientation Level: Oriented X4 Attention: Sustained;Selective Sustained Attention: Appears intact Selective Attention: Impaired Selective Attention Impairment: Functional basic;Functional complex;Verbal complex Memory: Impaired Memory Impairment: Storage deficit;Retrieval deficit Awareness: Impaired Awareness Impairment: Anticipatory impairment Problem Solving: Impaired Problem Solving Impairment: Functional basic;Functional complex Executive Function: Sequencing Sequencing: Appears intact Safety/Judgment: Impaired (Occasionally requires vc's for safety during functional tasks especially involving dynamic balance and transfers in stance (RW safety, sitting while donning/doffing pants, etc.)) Sensation Sensation Light Touch: Impaired Detail Light Touch Impaired Details:  Impaired LUE Additional Comments: Pt able to detect light touch in the LUE throughout but reports numbness compared to the left, especially in the distal fingers. Coordination Gross Motor Movements are Fluid and Coordinated: No Fine Motor Movements are Fluid and Coordinated: No Coordination  and Movement Description: L LE decreased compared to R with toe taps, heel to shin Motor  Motor Motor: Hemiplegia Motor - Discharge Observations: Mild left hemi  Mobility Bed Mobility Bed Mobility: Supine to Sit;Sit to Supine;Rolling Right;Rolling Left;Right Sidelying to Sit Rolling Right: Independent Rolling Left: Independent Right Sidelying to Sit: Independent Supine to Sit: Independent Sit to Supine: Independent Transfers Transfers: Sit to Stand;Stand to Sit;Stand Pivot Transfers Sit to Stand: Supervision/Verbal cueing Stand to Sit: Supervision/Verbal cueing Stand Pivot Transfers: Supervision/Verbal cueing Stand Pivot Transfer Details: Verbal cues for technique;Verbal cues for safe use of DME/AE Transfer (Assistive device): Rolling walker Locomotion  Gait Ambulation: Yes Gait Assistance: Supervision/Verbal cueing Gait Distance (Feet): 150 Feet Assistive device: Rolling walker Gait Gait: Yes Gait Pattern: Impaired Gait Pattern: Decreased stride length;Step-to pattern;Shuffle;Left flexed knee in stance;Lateral hip instability Stairs / Additional Locomotion Stairs: Yes Stairs Assistance: Contact Guard/Touching assist Stair Management Technique: Two rails Number of Stairs: 4 Height of Stairs: 6 Ramp: Supervision/Verbal cueing Wheelchair Mobility Wheelchair Mobility: Yes Wheelchair Assistance: Chartered loss adjuster: Both lower extermities Wheelchair Parts Management: Supervision/cueing Distance: 40'  Trunk/Postural Assessment  Cervical Assessment Cervical Assessment: Within Functional Limits Thoracic Assessment Thoracic Assessment: Exceptions to [facility]  (rounded shoulders) Lumbar Assessment Lumbar Assessment: Exceptions to [facility] (posterior pelvic tilt) Postural Control Postural Control: Deficits on evaluation  Balance Balance Balance Assessed: Yes Static Sitting Balance Static Sitting - Balance Support: Feet supported Static Sitting - Level of Assistance: 7: Independent Dynamic Sitting Balance Dynamic Sitting - Balance Support: Feet supported;No upper extremity supported Dynamic Sitting - Level of Assistance: 5: Stand by assistance Dynamic Sitting - Balance Activities: Lateral lean/weight shifting;Forward lean/weight shifting;Reaching for objects;Reaching across midline Static Standing Balance Static Standing - Balance Support: During functional activity;No upper extremity supported;Bilateral upper extremity supported Static Standing - Level of Assistance: 5: Stand by assistance Dynamic Standing Balance Dynamic Standing - Balance Support: During functional activity;Bilateral upper extremity supported;No upper extremity supported Dynamic Standing - Level of Assistance: 5: Stand by assistance Extremity Assessment  RUE Assessment RUE Assessment: Within Functional Limits LUE Assessment LUE Assessment: Exceptions to [facility] Passive Range of Motion (PROM) Comments: WFL Active Range of Motion (AROM) Comments: WFL General Strength Comments: 3+ to 4/5 with decreased coordination; uses at functionally diminished level for selfcare tasks RLE Assessment Active Range of Motion (AROM) Comments: [facility] General Strength Comments: hip flexion 4/5, knee extension 4+/5, ankle DF 4+/5 LLE Assessment Active Range of Motion (AROM) Comments: [facility] General Strength Comments: hip flexion 4/5, knee extension 4+/5, ankle DF 4+/5    Jamison Oka, PT 11/20/2020, 12:51 PM

## 2020-11-20 NOTE — Progress Notes (Addendum)
Physical Therapy Session Note  Patient Details  Name: Erica [name] MRN: [id number] Date of Birth: Oct 19, 1955  Today's Date: 11/20/2020 PT Individual Time:  805-900   55 min   Short Term Goals: Week 2:  PT Short Term Goal 1 (Week 2): STG=LTG due to ELOS  Skilled Therapeutic Interventions/Progress Updates:    Pt supine in bed at start of session. Supervision assistance with bed mobility and min assistance to untie shoes before donning them secondary to pt reporting sore fingers from CGB finger pokes. 10 minutes standing balance at edge of sink while brushing teeth and washing face with supervision assist. Noted hip and knee flexion while standing. Pt ambulated 300 ft from room to therapy gym with RW and supervision assistance for safety with hallway and elevator entrance navigation. Pt completed car transfer, ascending and descending ramp, and gait training on unlevel ground with RW and verbal cuing for technique with RW and proper body mechanics to promote safety and independence. 8 3" and 4 6" height stairs completed x2, BUE support and step to pattern with SBA and verbal cuing for LE sequencing. Berg balance assessment completed with a score of 35 which places pt in fall risk category. Pt education provided on results and recommendation of RW use for all ambu to maximize safety and independence. Pt seated in w/c at end of session with call bell in reach.   Therapy Documentation Precautions:  Precautions Precautions: Fall Precaution Comments: mild L hemi Restrictions Weight Bearing Restrictions: No     Pain: Pain Assessment Pain Scale: 0-10 Pain Score: 0-No pain   Balance: Standardized Balance Assessment Standardized Balance Assessment: Berg Balance Berg Score: 35 (score of < 45 indicates individuals may be at greater risk of falling)   Therapy/Group: Individual Therapy  Shonna Deiter, SPT 11/20/2020, 12:17 PM

## 2020-11-20 NOTE — Progress Notes (Signed)
Recreational Therapy Discharge Summary Patient Details  Name: Erica [name] MRN: [id number] Date of Birth: August 12, 1955 Today's Date: 11/20/2020  Long term goals set: 1  Long term goals met: 1  Comments on progress toward goals: Pt has made good progress during LOS and is discharging home with family to provide/coordinate supervision/assistance.  TR sessions focused on coping strategies, activity analysis identifying potential modifications, community pursuits, pet safety, and energy conservation.  Pt required verbal cues for encouragement and is supervision or set up assist level.  Reasons for discharge: discharge from hospital Patient/family agrees with progress made and goals achieved: Yes  [name] 11/20/2020, 12:27 PM

## 2020-11-20 NOTE — Progress Notes (Signed)
Patient ID: Erica [name], female   DOB: 11/28/1955, 65 y.o.   MRN: [id number]  Met with pt who discussed concerned about the level of stress at home with her grown son. She wants MH and BI resources, and hopes he will agree to talk with someone. She feels he has had a BI in the past when his personality changed. She is aware the stressful environment is not good for her own physical and mental health. She has no plans to ask him to leave due to he would be homeless, but he tends to make her fele bad regarding her parenting skills. Reminded her her son is grown and needs to take some responsibility and accountability for his own life and choices. If something happens to her he will have no place to stay. She has told him this. Discussed she is in control of her own stress and the main goal is to take care of herself and limit her stress. Her sister at times causes her stress also standing up to her nephew. Have given her resources for BI and MH services, son will need to pursue them. She is aware of this. Pt feels ready to go home tomorrow. Asked again if she would consider going to sister's and she said she has no plans to do this. Set for DC tomorrow.

## 2020-11-21 LAB — GLUCOSE, CAPILLARY: Glucose-Capillary: 81 mg/dL (ref 70–99)

## 2020-11-21 NOTE — Progress Notes (Signed)
[facility] INPATIENT REHABILITATION DISCHARGE NOTE   Discharge instructions by: per patient done by PA yesterday; no further questions noted  Verbalized understanding:yes  Skin care/Wound care:none  Pain:none  IV's:none  Tubes/Drains:none  Safety instructions:done  Patient belongings:with patient  Discharged GZ:[id number]   Discharged [id number] accompanied by NT; sister  Notes:home medications from pharmacy given ad signed by patient; copy in chart

## 2020-11-23 ENCOUNTER — Ambulatory Visit: Payer: Medicare HMO | Admitting: Cardiology

## 2020-11-23 ENCOUNTER — Institutional Professional Consult (permissible substitution): Payer: Medicare HMO | Admitting: Cardiology

## 2020-11-25 ENCOUNTER — Encounter: Payer: Self-pay | Admitting: Cardiology

## 2020-11-25 ENCOUNTER — Ambulatory Visit: Payer: Medicare HMO | Admitting: Adult Health

## 2020-11-25 ENCOUNTER — Ambulatory Visit: Payer: Medicare HMO | Admitting: Cardiology

## 2020-11-25 ENCOUNTER — Other Ambulatory Visit: Payer: Self-pay

## 2020-11-25 VITALS — BP 141/80 | HR 97 | Temp 98.4°F | Resp 16 | Ht 63.0 in | Wt 182.6 lb

## 2020-11-25 DIAGNOSIS — I2510 Atherosclerotic heart disease of native coronary artery without angina pectoris: Secondary | ICD-10-CM

## 2020-11-25 DIAGNOSIS — I129 Hypertensive chronic kidney disease with stage 1 through stage 4 chronic kidney disease, or unspecified chronic kidney disease: Secondary | ICD-10-CM

## 2020-11-25 DIAGNOSIS — I2584 Coronary atherosclerosis due to calcified coronary lesion: Secondary | ICD-10-CM

## 2020-11-25 DIAGNOSIS — E119 Type 2 diabetes mellitus without complications: Secondary | ICD-10-CM

## 2020-11-25 DIAGNOSIS — Z8673 Personal history of transient ischemic attack (TIA), and cerebral infarction without residual deficits: Secondary | ICD-10-CM

## 2020-11-25 DIAGNOSIS — E782 Mixed hyperlipidemia: Secondary | ICD-10-CM

## 2020-11-25 DIAGNOSIS — Z87891 Personal history of nicotine dependence: Secondary | ICD-10-CM

## 2020-11-25 DIAGNOSIS — E781 Pure hyperglyceridemia: Secondary | ICD-10-CM

## 2020-11-25 NOTE — Progress Notes (Signed)
[name] Date of Birth: 1956/01/16 MRN: [id number] Primary Care Provider:Avbuere, Christean Grief, MD Former Cardiology Providers: Dr. Adrian Prows, Jeri Lager, APRN, FNP-C Primary Cardiologist: Rex Kras, DO, [facility] (established care 11/19/2019)  Date: 11/25/20 Last Office Visit: 09/08/2020  Chief Complaint  Patient presents with   Hospitalization Follow-up    Stroke    HPI   Erica [name] is a 65 y.o.  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    Patient is accompanied by her sister and she provides verbal consent with regards to discussing her medical information in her sister's presence.  Since last office visit she was hospitalized in May 2022 for another stroke involving the right parietal occipital region likely due to small vessel disease but per neurology's progress note embolic source cannot be ruled out given her recent stroke in January 2022.  Patient has minimal residual defects given her recent stroke.  After being discharged from the hospital she was in inpatient rehab and just got discharged earlier in June 2022 and now presents for follow-up.  Patient states that in the hospital she was recommended to undergo loop recorder implantation given her 2 strokes within the last 6 months.  However she would like to discuss the pros and cons at today's visit prior to considering it.  Reviewed the most recent hospitalization records with the patient at today's office visit and updated below for further reference.  Patient denies any  chest pain or shortness of breath at rest or with effort related activities.  She had a carotid duplex which did not show any significant ICA disease.  Echocardiogram noted preserved LVEF without any significant valvular heart disease, most recent LDL 56 mg/dL, and most recent A1c 5.2.  She continues to ambulate with a 2 wheel walker.    ALLERGIES: Allergies  Allergen Reactions   Penicillins Hives, Itching and Swelling    Tongue swells up Has patient had a PCN reaction causing immediate rash, facial/tongue/throat swelling, SOB or lightheadedness with hypotension: Yes Has patient had a PCN reaction causing severe rash involving mucus membranes or skin necrosis: Yes Has patient had a PCN reaction that required hospitalization Yes Has patient had a PCN reaction occurring within the last 10 years: No If all of the above answers are "NO", then may proceed with Cephalosporin use.      MEDICATION LIST PRIOR TO VISIT: Current Outpatient Medications on File Prior to Visit  Medication Sig Dispense Refill   acetaminophen (TYLENOL) 325 MG tablet Take 2 tablets (650 mg total) by mouth every 4 (four) hours as needed for mild pain (or temp > 37.5 C (99.5 F)).     albuterol (VENTOLIN HFA) 108 (90 Base) MCG/ACT inhaler Inhale 2 puffs into the lungs every 4 (four) hours as needed for wheezing. 1 g 5   amLODipine (NORVASC) 10 MG tablet Take 1 tablet (10 mg total) by mouth daily. 30 tablet 2   aspirin EC 81 MG EC tablet Take 1 tablet (81 mg total) by mouth daily. Swallow whole. 30 tablet 11   busPIRone (BUSPAR) 10 MG tablet Take 1 tablet (  10 mg total) by mouth 2 (two) times daily. 60 tablet 0   clopidogrel (PLAVIX) 75 MG tablet Take 1 tablet (75 mg total) by mouth daily. 7 tablet 0   diclofenac Sodium (VOLTAREN) 1 % GEL Apply 2 g topically daily as needed (Knee pain). 2 g 0   famotidine (PEPCID) 20 MG tablet Take 1 tablet (20 mg total) by mouth daily. 30 tablet 0   gabapentin (NEURONTIN) 100 MG capsule  Take 2 capsules (200 mg total) by mouth 2 (two) times daily as needed (pain). 60 capsule 0   insulin glargine (LANTUS SOLOSTAR) 100 UNIT/ML Solostar Pen Please provide 24 units every a.m. and 20 units every p.m. 15 mL 11   Insulin Pen Needle (PEN NEEDLES) 32G X 6 MM MISC 1 application by Does not apply route 2 (two) times daily. 100 each 1   Multiple Vitamin (MULTIVITAMIN ADULT PO) Take 1 tablet by mouth daily.     Pirfenidone (ESBRIET) 267 MG TABS Take 3 tablets (801 mg total) by mouth in the morning, at noon, and at bedtime. 270 tablet 11   rosuvastatin (CRESTOR) 20 MG tablet Take 1 tablet (20 mg total) by mouth daily. 30 tablet 0   SYMBICORT 160-4.5 MCG/ACT inhaler Inhale 2 puffs into the lungs 2 (two) times daily. 1 each 5   Tiotropium Bromide Monohydrate (SPIRIVA RESPIMAT) 1.25 MCG/ACT AERS Inhale 2 puffs into the lungs daily. 4 g 5   traMADol (ULTRAM) 50 MG tablet Take 1 tablet (50 mg total) by mouth every 8 (eight) hours as needed for severe pain. 30 tablet 0   traZODone (DESYREL) 50 MG tablet Take 1 tablet (50 mg total) by mouth at bedtime. 30 tablet 0   venlafaxine XR (EFFEXOR-XR) 75 MG 24 hr capsule Take 1 capsule (75 mg total) by mouth daily with breakfast. 30 capsule 0   losartan (COZAAR) 50 MG tablet Take 1 tablet (50 mg total) by mouth daily. 30 tablet 0   No current facility-administered medications on file prior to visit.    PAST MEDICAL HISTORY: Past Medical History:  Diagnosis Date   Arthritis    especially in shoulders   Asthma    Depression    Diabetes mellitus    GERD (gastroesophageal reflux disease)    Headache(784.0)    "mild"   Hypertension    Mental disorder    depression   Neuropathy    feet    Pain    arthritis pain - takes tramadol as needed   Rectal polyp    very little bleeding with bowel movements- no pain   Suicide attempt ([place])     PAST SURGICAL HISTORY: Past Surgical History:  Procedure Laterality Date   ANAL RECTAL MANOMETRY N/A 07/13/2016    Procedure: ANO RECTAL MANOMETRY;  Surgeon: Leighton Ruff, MD;  Location: [facility];  Service: Endoscopy;  Laterality: N/A;   CESAREAN SECTION     EUS N/A 11/21/2012   Procedure: LOWER ENDOSCOPIC ULTRASOUND (EUS);  Surgeon: Arta Silence, MD;  Location: [facility];  Service: Endoscopy;  Laterality: N/A;   FLEXIBLE SIGMOIDOSCOPY N/A 11/21/2012   Procedure: FLEXIBLE SIGMOIDOSCOPY;  Surgeon: Arta Silence, MD;  Location: [facility];  Service: Endoscopy;  Laterality: N/A;   FLEXIBLE SIGMOIDOSCOPY N/A 01/28/2013   Procedure: FLEXIBLE SIGMOIDOSCOPY;  Surgeon: Leighton Ruff, MD;  Location: [facility];  Service: Endoscopy;  Laterality: N/A;   LAPAROSCOPIC LOW ANTERIOR RESECTION N/A 01/29/2013   Procedure: LAPAROSCOPIC LOW ANTERIOR RESECTION, Rigid Proctoscopy;  Surgeon: Leighton Ruff, MD;  Location: [facility];  Service: General;  Laterality: N/A;   LAPAROSCOPIC SIGMOID COLECTOMY N/A 11/14/2012   Procedure: DIAGNOSTIC LAPAROSCOPY AND SIGMOIDMOIDOSCOPY ;  Surgeon: Rolm Bookbinder, MD;  Location: [facility];  Service: General;  Laterality: N/A;   RECTAL ULTRASOUND N/A 07/13/2016   Procedure: RECTAL ULTRASOUND;  Surgeon: Leighton Ruff, MD;  Location: [facility];  Service: Endoscopy;  Laterality: N/A;   TONSILLECTOMY     TONSILLECTOMY AND ADENOIDECTOMY      FAMILY HISTORY: The patient's family history includes Cancer in her maternal aunt, maternal uncle, maternal uncle, maternal uncle, paternal aunt, and paternal uncle; Cancer (age of onset: 36) in an other family member; Diabetes in her father; Heart disease in her mother; Hyperlipidemia in her mother; Hypertension in her brother, brother, father, mother, sister, and sister; Stroke in her father.   SOCIAL HISTORY:  The patient  reports that she quit smoking about 2 years ago. Her smoking use included cigarettes. She has a 10.00 pack-year smoking history. She has never used smokeless tobacco. She reports previous alcohol use. She reports previous drug  use. Drug: "Crack" cocaine.  Review of Systems  Constitutional: Negative for chills and fever.  HENT:  Negative for hoarse voice and nosebleeds.   Eyes:  Negative for discharge, double vision and pain.  Cardiovascular:  Negative for chest pain, claudication, leg swelling, near-syncope, orthopnea, palpitations, paroxysmal nocturnal dyspnea and syncope.  Respiratory:  Negative for hemoptysis and shortness of breath.   Musculoskeletal:  Negative for muscle cramps and myalgias.  Gastrointestinal:  Negative for abdominal pain, constipation, diarrhea, hematemesis, hematochezia, melena, nausea and vomiting.  Neurological:  Positive for light-headedness (chronic and stable), numbness and paresthesias. Negative for dizziness.   PHYSICAL EXAM: Vitals with BMI 11/25/2020 11/21/2020 11/20/2020  Height _0  - -  Weight 182 lbs 10 oz - -  BMI 10.62 - -  Systolic [phone]  Diastolic 80 77 89  Pulse 97 64 72    CONSTITUTIONAL: Appears older than stated age.  Hemodynamically stable.  well-nourished. No acute distress.  SKIN: Skin is warm and dry. No rash noted. No cyanosis. No pallor. No jaundice HEAD: Normocephalic and atraumatic.  EYES: No scleral icterus MOUTH/THROAT: Moist oral membranes.  NECK: No JVD present. No thyromegaly noted. No carotid bruits  LYMPHATIC: No visible cervical adenopathy.  CHEST Normal respiratory effort. No intercostal retractions  LUNGS: Clear to auscultation bilaterally.  No stridor. No wheezes. No rales.  CARDIOVASCULAR: Regular rate and rhythm, positive S1-S2, no murmurs rubs or gallops appreciated. ABDOMINAL: Obese, soft, nontender, nondistended, positive bowel sounds all 4 quadrants. No apparent ascites.  EXTREMITIES: No peripheral edema  HEMATOLOGIC: No significant bruising NEUROLOGIC: Oriented to person, place, and time.  5 out of 5 strength in the right upper and lower extremity.  4 out of 5 strength in the left upper and lower extremity. PSYCHIATRIC: Normal  mood and affect. Normal behavior. Cooperative  RADIOLOGY: CT of chest 04/12/2019:  1. The appearance of the lungs remains compatible with interstitial lung disease, with a spectrum of findings considered diagnostic of usual interstitial pneumonia (UIP) per current ATS guidelines. Minimal progression compared to the prior study. 2. Aortic atherosclerosis, in addition to left anterior descending coronary artery disease.  3. Cholelithiasis.  Aortic Atherosclerosis (ICD10-I70.0).  CT head without contrast 06/25/2020: 1. Lacunar infarcts in the right caudate and right cerebellum, new from 2012, but age indeterminate. 2. No hemorrhage or evidence of large or territorial ischemia. 3. Chronic left sphenoid sinusitis.  MRI brain without contrast 06/25/2020: 1. 1  cm acute infarction in the anterolateral thalamus on the right, possibly with some involvement of the posterior limb internal capsule. Acute infarction within the right caudate head. Scattered other foci of high signal on diffusion imaging within the hemispheric white matter, not showing restricted diffusion on the ADC map, and therefore probably representing T2 shine through from old white matter infarctions. 2. Extensive old small vessel infarctions throughout the brain as outlined above. Small old cortical infarctions in the right frontal lobe and both parieto-occipital regions. 3. No large or medium vessel occlusion or correctable proximal stenosis. Mild atherosclerotic irregularity of the PCA branches.  MR cervical spine without contrast 06/26/2020 1. 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 Mild generalized atrophy of the upper cervical spinal cord. 2. Moderate C6-7 and mild C5-6 and C7-T1 spinal canal stenosis secondary to central disc protrusions.   MR Brain Wo Contrast (neuro protocol) Result Date: 11/03/2020 IMPRESSION: Newly seen punctate acute infarction at the medial right  parietooccipital junction. No other change since the most recent exam of January. Chronic small-vessel ischemic changes of the pons, cerebellum, thalami, basal ganglia and hemispheric white matter. Old cortical and subcortical infarctions in the occipital regions and frontoparietal regions.  CARDIAC DATABASE: EKG: 11/25/2020: Normal sinus rhythm, 86 bpm, left atrial enlargement, nonspecific T wave abnormality.    Echocardiogram: 11/04/2020: LVEF 60-65%, moderate LVH, grade 1 diastolic impairment, elevated LAP, normal right ventricular size and function, mild AR.  Stress Testing:  Lexiscan tetrofosmin stress test 07/22/2019: There is  apical thinning an mild soft tissue attenuation artifact in inferior wall without ischemia or infarct. All segments of left ventricle demonstrated normal wall motion and thickening. Stress LV EF is moderately dysfunctional 39%, however visually appearted to be normal. No previous exam available for comparison. Low risk study.   Carotid Duplex 11/05/2020: Right Carotid: The extracranial vessels were near-normal with only minimal wall thickening or plaque.  Left Carotid: The extracranial vessels were near-normal with only minimal wall thickening or plaque.  Vertebrals: Bilateral vertebral arteries demonstrate antegrade flow.   Heart Catheterization: None  14 day extended Holter monitor: Dominant rhythm normal sinus rhythm Heart rate 44-179 bpm.  Avg HR 69 bpm. No atrial fibrillation, high grade AV block, pauses (3 seconds or longer). 1 episode of nonsustained ventricular tachycardia, 13 beats in duration, max heart rate of 179 bpm. 1 episode of supraventricular tachycardia, 6 beats in duration, maximum heart rate 164bpm. Total ventricular ectopic burden <1%. Total supraventricular ectopic burden <1%. Patient triggered events: 0.  LABORATORY DATA: CBC Latest Ref Rng & Units 11/17/2020 11/13/2020 11/10/2020  WBC 4.0 - 10.5 K/uL 3.6(L) 3.1(L) 3.7(L)  Hemoglobin 12.0 -  15.0 g/dL 10.9(L) 10.3(L) 10.3(L)  Hematocrit 36.0 - 46.0 % 31.3(L) 28.8(L) 28.7(L)  Platelets 150 - 400 K/uL 154 146(L) 132(L)    CMP Latest Ref Rng & Units 11/20/2020 11/17/2020 11/13/2020  Glucose 70 - 99 mg/dL 120(H) 119(H) 150(H)  BUN 8 - 23 mg/dL 36(H) 46(H) 27(H)  Creatinine 0.44 - 1.00 mg/dL 1.78(H) 1.74(H) 1.73(H)  Sodium 135 - 145 mmol/L 135 137 136  Potassium 3.5 - 5.1 mmol/L 4.4 4.4 4.5  Chloride 98 - 111 mmol/L 108 109 108  CO2 22 - 32 mmol/L 19(L) 21(L) 21(L)  Calcium 8.9 - 10.3 mg/dL 9.0 9.3 8.9  Total Protein 6.5 - 8.1 g/dL - - -  Total Bilirubin 0.3 - 1.2 mg/dL - - -  Alkaline Phos 38 - 126 U/L - - -  AST 15 - 41  U/L - - -  ALT 0 - 44 U/L - - -    Lipid Panel  Lab Results  Component Value Date   CHOL 131 11/04/2020   HDL 58 11/04/2020   LDLCALC 56 11/04/2020   LDLDIRECT 56 04/23/2020   TRIG 87 11/04/2020   CHOLHDL 2.3 11/04/2020   Lab Results  Component Value Date   HGBA1C 6.3 (H) 11/04/2020   HGBA1C 5.2 06/26/2020   HGBA1C 7.9 (H) 05/11/2019   No components found for: NTPROBNP Lab Results  Component Value Date   TSH 3.642 06/26/2020   TSH 3.53 06/25/2020   TSH 2.562 08/21/2008    Cardiac Panel (last 3 results) No results for input(s): CKTOTAL, CKMB, TROPONINIHS, RELINDX in the last 72 hours.  IMPRESSION:    ICD-10-CM   1. Hx of stroke involving the right caudate nucleus and right anterolateral thalamus (Jan 2022), Hx of  right parietooccipital stroke (May 2022)  Z86.73     2. Coronary atherosclerosis due to calcified coronary lesion of native artery  I25.10    I25.84     3. Mixed hyperlipidemia  E78.2     4. Hypertriglyceridemia  E78.1     5. Benign hypertension with CKD (chronic kidney disease) stage III (HCC)  I12.9 EKG 12-Lead   N18.30     6. Non-insulin dependent type 2 diabetes mellitus ([place])  E11.9     7. Former tobacco use  Z87.891        RECOMMENDATIONS: DHWANI VENKATESH is a 65 y.o. female whose past medical history and  cardiovascular risk factors include: Recent stroke involving the right caudate nucleus and right anterolateral thalamus (March 2022), rectal CA in 2014 that is in remission, T2DM, former tobacco use, emphysema, interstitial lung disease, idiopathic pulmonary fibrosis, and coronary artery calcification on CT scan.    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  Subsequently underwent inpatient rehab and recently discharged. Patient was on aspirin and Plavix for 3 weeks and now aspirin 81 mg p.o. daily . Unable to accurately perform medication reconciliation as she did not bring in the medication list or her medication bottles for [place] to review. Continue statin therapy, LDL is less than 70 mg/dL. Hemoglobin A1c 5.2. Blood pressures within acceptable range.  However if she is asked to keep a log of her blood pressures and to bring it in at the next office visit.  She can also call the office or her PCP if her systolic blood pressures are consistently greater than 140 mmHg.   We discussed the risks, benefits, alternatives with regards to loop recorder implantation.  Her questions and concerns were addressed.  And she would like to proceed.   Most recent hospitalization records reviewed with the patient in great detail and diagnostic testing results are updated above.     Coronary artery calcification: Continue ASA and statin. Ischemic evaluation as discussed above Denies any anginal discomfort. Continue to monitor.    Hyperlipidemia: Continue atorvastatin Most recent lipid profile reviewed -May 2022. She does not endorse any myalgias. LDL currently at goal.  Hypertriglyceridemia:   Educated on the importance of dietary restrictions.   Continue current medical therapy.    Benign essential  hypertension with chronic kidney disease stage III Office blood pressure within acceptable range.   Medications were difficult to reconcile at today's visit.  Patient is asked to keep a log of her blood pressures and to review it with her PCP.  Currently managed by primary care provider.   FINAL MEDICATION LIST END OF ENCOUNTER: (Unable to accurately perform med reconciliation as she did not bring in her medications or medication list with her at today's office visit.) No orders of the defined types were placed in this encounter.    Current Outpatient Medications:    acetaminophen (TYLENOL) 325 MG tablet, Take 2 tablets (650 mg total) by mouth every 4 (four) hours as needed for mild pain (or temp > 37.5 C (99.5 F))., Disp: , Rfl:    albuterol (VENTOLIN HFA) 108 (90 Base) MCG/ACT inhaler, Inhale 2 puffs into the lungs every 4 (four) hours as needed for wheezing., Disp: 1 g, Rfl: 5   amLODipine (NORVASC) 10 MG tablet, Take 1 tablet (10 mg total) by mouth daily., Disp: 30 tablet, Rfl: 2   aspirin EC 81 MG EC tablet, Take 1 tablet (81 mg total) by mouth daily. Swallow whole., Disp: 30 tablet, Rfl: 11   busPIRone (BUSPAR) 10 MG tablet, Take 1 tablet (10 mg total) by mouth 2 (two) times daily., Disp: 60 tablet, Rfl: 0   clopidogrel (PLAVIX) 75 MG tablet, Take 1 tablet (75 mg total) by mouth daily., Disp: 7 tablet, Rfl: 0   diclofenac Sodium (VOLTAREN) 1 % GEL, Apply 2 g topically daily as needed (Knee pain)., Disp: 2 g, Rfl: 0   famotidine (PEPCID) 20 MG tablet, Take 1 tablet (20 mg total) by mouth daily., Disp: 30 tablet, Rfl: 0   gabapentin (NEURONTIN) 100 MG capsule, Take 2 capsules (200 mg total) by mouth 2 (two) times daily as needed (pain)., Disp: 60 capsule, Rfl: 0   insulin glargine (LANTUS SOLOSTAR) 100 UNIT/ML Solostar Pen, Please provide 24 units every a.m. and 20 units every p.m., Disp: 15 mL, Rfl: 11   Insulin Pen Needle (PEN NEEDLES) 32G X 6 MM MISC, 1 application by Does not apply route  2 (two) times daily., Disp: 100 each, Rfl: 1   Multiple Vitamin (MULTIVITAMIN ADULT PO), Take 1 tablet by mouth daily., Disp: , Rfl:    Pirfenidone (ESBRIET) 267 MG TABS, Take 3 tablets (801 mg total) by mouth in the morning, at noon, and at bedtime., Disp: 270 tablet, Rfl: 11   rosuvastatin (CRESTOR) 20 MG tablet, Take 1 tablet (20 mg total) by mouth daily., Disp: 30 tablet, Rfl: 0   SYMBICORT 160-4.5 MCG/ACT inhaler, Inhale 2 puffs into the lungs 2 (two) times daily., Disp: 1 each, Rfl: 5   Tiotropium Bromide Monohydrate (SPIRIVA RESPIMAT) 1.25 MCG/ACT AERS, Inhale 2 puffs into the lungs daily., Disp: 4 g, Rfl: 5   traMADol (ULTRAM) 50 MG tablet, Take 1 tablet (50 mg total) by mouth every 8 (eight) hours as needed for severe pain., Disp: 30 tablet, Rfl: 0   traZODone (DESYREL) 50 MG tablet, Take 1 tablet (50 mg total) by mouth at bedtime., Disp: 30 tablet, Rfl: 0   venlafaxine XR (EFFEXOR-XR) 75 MG 24 hr capsule, Take 1 capsule (75 mg total) by mouth daily with breakfast., Disp: 30 capsule, Rfl: 0   losartan (COZAAR) 50 MG tablet, Take 1 tablet (50 mg total) by mouth daily., Disp: 30 tablet, Rfl: 0  Orders Placed This Encounter  Procedures   EKG 12-Lead   --Continue cardiac medications as reconciled in final medication list. --Return in about 4 weeks (around 12/23/2020) for Status post loop implantation. Or sooner if needed. --Continue follow-up  with your primary care physician regarding the management of your other chronic comorbid conditions.  Total encounter time 58 minutes. *Total Encounter Time as defined by the Centers for Medicare and Medicaid Services includes, in addition to the face-to-face time of a patient visit (documented in the note above) non-face-to-face time: obtaining and reviewing outside history, ordering and reviewing medications, tests or procedures, care coordination (communications with other health care professionals or caregivers) and documentation in the medical  record.  Patient's questions and concerns were addressed to her satisfaction. She voices understanding of the instructions provided during this encounter.   This note was created using a voice recognition software as a result there may be grammatical errors inadvertently enclosed that do not reflect the nature of this encounter. Every attempt is made to correct such errors.  Rex Kras, [place], [facility]  Pager: [phone] Office: [phone]

## 2020-11-30 ENCOUNTER — Other Ambulatory Visit: Payer: Self-pay | Admitting: Internal Medicine

## 2020-12-01 LAB — BASIC METABOLIC PANEL WITH GFR
BUN/Creatinine Ratio: 17 (calc) (ref 6–22)
BUN: 26 mg/dL — ABNORMAL HIGH (ref 7–25)
CO2: 22 mmol/L (ref 20–32)
Calcium: 8.6 mg/dL (ref 8.6–10.4)
Chloride: 110 mmol/L (ref 98–110)
Creat: 1.5 mg/dL — ABNORMAL HIGH (ref 0.50–0.99)
GFR, Est African American: 42 mL/min/{1.73_m2} — ABNORMAL LOW (ref >=60)
GFR, Est Non African American: 36 mL/min/{1.73_m2} — ABNORMAL LOW (ref >=60)
Glucose, Bld: 58 mg/dL — ABNORMAL LOW (ref 65–99)
Potassium: 4.5 mmol/L (ref 3.5–5.3)
Sodium: 141 mmol/L (ref 135–146)

## 2020-12-01 LAB — CBC
HCT: 29 % — ABNORMAL LOW (ref 35.0–45.0)
Hemoglobin: 9.8 g/dL — ABNORMAL LOW (ref 11.7–15.5)
MCH: 31.9 pg (ref 27.0–33.0)
MCHC: 33.8 g/dL (ref 32.0–36.0)
MCV: 94.5 fL (ref 80.0–100.0)
MPV: 9.7 fL (ref 7.5–12.5)
Platelets: 147 10*3/uL (ref 140–400)
RBC: 3.07 10*6/uL — ABNORMAL LOW (ref 3.80–5.10)
RDW: 14.9 % (ref 11.0–15.0)
WBC: 4.7 10*3/uL (ref 3.8–10.8)

## 2020-12-03 ENCOUNTER — Encounter: Payer: Medicare HMO | Attending: Registered Nurse | Admitting: Registered Nurse

## 2020-12-03 ENCOUNTER — Encounter: Payer: Self-pay | Admitting: Registered Nurse

## 2020-12-03 ENCOUNTER — Other Ambulatory Visit: Payer: Self-pay

## 2020-12-03 VITALS — BP 144/85 | HR 78 | Temp 97.7°F | Ht 63.0 in | Wt 187.6 lb

## 2020-12-03 DIAGNOSIS — I6389 Other cerebral infarction: Secondary | ICD-10-CM | POA: Diagnosis present

## 2020-12-03 DIAGNOSIS — Z794 Long term (current) use of insulin: Secondary | ICD-10-CM | POA: Diagnosis present

## 2020-12-03 DIAGNOSIS — E1165 Type 2 diabetes mellitus with hyperglycemia: Secondary | ICD-10-CM | POA: Diagnosis present

## 2020-12-03 DIAGNOSIS — I10 Essential (primary) hypertension: Secondary | ICD-10-CM | POA: Diagnosis present

## 2020-12-03 NOTE — Progress Notes (Signed)
Subjective:    Patient ID: Erica [name], female    DOB: 09/10/1955, 65 y.o.   MRN: [id number]  HPI: Erica [name] is a 65 y.o. female who is here for HFU appointment of her  Parietal Lobe Infarction, Essential Hypertension and Controlled type 2 DM with hyperglycemia.  Ms. July presented to [facility] Emergency room on 11/03/2020,   Per Dr Myna Hidalgo H&P to the emergency department with 3 days of left hand and facial numbness, and left leg weakness.  She has also been experiencing headache on the right side for approximately 4 weeks.  Headache has been intermittent, occurring a couple times a day, and attributed by the patient to anxiety.  Headache has been relieved with acetaminophen.  The left hand and facial numbness have been present for the past 3 days.  MR Brain:  IMPRESSION: Newly seen punctate acute infarction at the medial right parietooccipital junction. No other change since the most recent exam of January.   Chronic small-vessel ischemic changes of the pons, cerebellum, thalami, basal ganglia and hemispheric white matter. Old cortical and subcortical infarctions in the occipital regions and frontoparietal regions.  MR Cervical Spine:  IMPRESSION: 1. Persistent patchy signal abnormality involving the central/dorsal aspect of the cervical spinal cord, extending from C2 through C7-T1, relatively stable and unchanged from previous. 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 previous myelitis). The underlying cord is diffusely atrophic, also stable. 2. Slight interval regression of right paracentral disc extrusion at C7-T1, with persistent mild spinal stenosis and right-sided cord flattening at this level. 3. Otherwise stable multilevel cervical spondylosis with resultant mild spinal stenosis at C5-6 and C6-7.   MR Angio Head WO  Contrast IMPRESSION: 1. Negative MRA for large vessel occlusion. 2. Atheromatous irregularity involving the PCAs bilaterally with superimposed moderate left P2 stenosis, mildly progressed from previous. 3. No other hemodynamically significant or correctable stenosis about the major intracranial arterial vasculature  [name] was admitted to Inpatient Rehabilitation on 11/06/2020 and discharged home on 11/21/2020. She will be receiving Home Health Therapy with [facility], has a scheduled appointment on 12/04/2020. She denies any pain. She rates her pain 0. Reports tingling and burning in her left hand. Also reports she has a good appetite.    Pain Inventory Average Pain 0 Pain Right Now 0 My pain is  no pain    BOWEL Number of stools per week: 7 Oral laxative use Yes  Type of laxative miralax   BLADDER   Bladder incontinence  at times has to wear a pad because cannot get to bathroom in time    Mobility walk with assistance use a walker ability to climb steps?  yes  Function disabled: date disabled current from stroke I need assistance with the following:  meal prep, household duties, and shopping  Neuro/Psych bladder control problems weakness numbness tingling trouble walking spasms dizziness confusion depression anxiety loss of taste or smell  Prior Studies Any changes since last visit?  no  Physicians involved in your care Primary care she has been to her primary care since discharge   Family History  Problem Relation Age of Onset   Hypertension Father    Stroke Father    Diabetes Father    Heart disease Mother    Hypertension Mother    Hyperlipidemia Mother    Hypertension Sister    Hypertension Brother    Hypertension  Sister    Hypertension Brother    Cancer Other 58       colon cancer    Cancer Maternal Aunt        cancer, unknown type    Cancer Maternal Uncle        bone cancer    Cancer Paternal Aunt        lung  cancer   Cancer Paternal Uncle        lung cancer   Cancer Maternal Uncle        colon cancer and prostate cancer    Cancer Maternal Uncle        prostate cancer    Social History   Socioeconomic History   Marital status: Single    Spouse name: Not on file   Number of children: 1   Years of education: Not on file   Highest education level: Not on file  Occupational History   Not on file  Tobacco Use   Smoking status: Former    Packs/day: 0.50    Years: 20.00    Pack years: 10.00    Types: Cigarettes    Quit date: 01/05/2018    Years since quitting: 2.9   Smokeless tobacco: Never   Tobacco comments:    Unknown   Vaping Use   Vaping Use: Never used  Substance and Sexual Activity   Alcohol use: Not Currently   Drug use: Not Currently    Types: "Crack" cocaine    Comment: Unknown    Sexual activity: Never    Birth control/protection: None  Other Topics Concern   Not on file  Social History Narrative   Not on file   Social Determinants of Health   Financial Resource Strain: Not on file  Food Insecurity: Not on file  Transportation Needs: Not on file  Physical Activity: Not on file  Stress: Not on file  Social Connections: Not on file   Past Surgical History:  Procedure Laterality Date   ANAL RECTAL MANOMETRY N/A 07/13/2016   Procedure: [place];  Surgeon: Leighton Ruff, MD;  Location: [facility];  Service: Endoscopy;  Laterality: N/A;   CESAREAN SECTION     EUS N/A 11/21/2012   Procedure: LOWER ENDOSCOPIC ULTRASOUND (EUS);  Surgeon: Arta Silence, MD;  Location: [facility];  Service: Endoscopy;  Laterality: N/A;   FLEXIBLE SIGMOIDOSCOPY N/A 11/21/2012   Procedure: FLEXIBLE SIGMOIDOSCOPY;  Surgeon: Arta Silence, MD;  Location: [facility];  Service: Endoscopy;  Laterality: N/A;   FLEXIBLE SIGMOIDOSCOPY N/A 01/28/2013   Procedure: FLEXIBLE SIGMOIDOSCOPY;  Surgeon: Leighton Ruff, MD;  Location: [facility];  Service: Endoscopy;  Laterality: N/A;    LAPAROSCOPIC LOW ANTERIOR RESECTION N/A 01/29/2013   Procedure: LAPAROSCOPIC LOW ANTERIOR RESECTION, Rigid Proctoscopy;  Surgeon: Leighton Ruff, MD;  Location: [facility];  Service: General;  Laterality: N/A;   LAPAROSCOPIC SIGMOID COLECTOMY N/A 11/14/2012   Procedure: DIAGNOSTIC LAPAROSCOPY AND SIGMOIDMOIDOSCOPY ;  Surgeon: Rolm Bookbinder, MD;  Location: [facility];  Service: General;  Laterality: N/A;   RECTAL ULTRASOUND N/A 07/13/2016   Procedure: RECTAL ULTRASOUND;  Surgeon: Leighton Ruff, MD;  Location: [facility];  Service: Endoscopy;  Laterality: N/A;   TONSILLECTOMY     TONSILLECTOMY AND ADENOIDECTOMY     Past Medical History:  Diagnosis Date   Arthritis    especially in shoulders   Asthma    Depression    Diabetes mellitus    GERD (gastroesophageal reflux disease)    Headache(784.0)    "mild"  Hypertension    Mental disorder    depression   Neuropathy    feet    Pain    arthritis pain - takes tramadol as needed   Rectal polyp    very little bleeding with bowel movements- no pain   Suicide attempt (HCC)    BP (!) 144/85   Pulse 78   Temp 97.7 F (36.5 C)   Ht _0  (1.6 m)   Wt 187 lb 9.6 oz (85.1 kg)   SpO2 96%   BMI 33.23 kg/m   Opioid Risk Score:  Fall Risk Score: `1  Depression screen PHQ 2/9  Depression screen [facility] 2/9 12/03/2020 07/29/2020  Decreased Interest 0 3  Down, Depressed, Hopeless 1 3  PHQ - 2 Score 1 6  Altered sleeping 1 3  Tired, decreased energy 3 3  Change in appetite 2 3  Feeling bad or failure about yourself  0 3  Trouble concentrating 3 3  Moving slowly or fidgety/restless 1 3  Suicidal thoughts 0 1  PHQ-9 Score 11 25  Difficult doing work/chores Very difficult -  Some recent data might be hidden     Review of Systems  Constitutional:  Positive for appetite change.  HENT: Negative.    Eyes: Negative.   Respiratory:  Positive for shortness of breath and wheezing.        Asthma  Cardiovascular:  Positive for leg swelling.   Gastrointestinal:  Positive for diarrhea.  Endocrine:       Fluctuating BS  Genitourinary:  Positive for urgency.  Musculoskeletal:  Positive for gait problem.  Skin: Negative.   Allergic/Immunologic: Negative.   Neurological:  Positive for dizziness, weakness and numbness.       Tingling  brain freezes  Hematological:  Bruises/bleeds easily.       Plavix  Psychiatric/Behavioral:  Positive for dysphoric mood. The patient is nervous/anxious.   All other systems reviewed and are negative.     Objective:   Physical Exam Vitals and nursing note reviewed.  Constitutional:      Appearance: Normal appearance.  Cardiovascular:     Rate and Rhythm: Normal rate and regular rhythm.     Pulses: Normal pulses.     Heart sounds: Normal heart sounds.  Pulmonary:     Effort: Pulmonary effort is normal.     Breath sounds: Normal breath sounds.  Musculoskeletal:     Cervical back: Normal range of motion and neck supple.     Comments: Normal Muscle Bulk and Muscle Testing Reveals:  Upper Extremities: Full ROM and Muscle Strength  on Right 5/5 and Left 4/5  Lower Extremities: Full ROM and Muscle Strength 5/5 Left lower Extremity Flexion Produces Pain into her Left Patella Arises from Table with ease using walker for support Narrow Based  Gait     Skin:    General: Skin is warm and dry.  Neurological:     Mental Status: She is alert and oriented to person, place, and time.  Psychiatric:        Mood and Affect: Mood normal.        Behavior: Behavior normal.         Assessment & Plan:  1.Parietal Lobe Infarction: Continue Home Health Therapy with [facility]. Has a scheduled appointment with [facility].  2. Essential Hypertension: Continue current medication regimen. PCP Following . Continue to Monitor.  3. Controlled type 2 DM with hyperglycemia. Continue current medication regimen. PCP following. Continue to monitor.  F/U in 1 month with Dr Letta Pate

## 2020-12-10 ENCOUNTER — Ambulatory Visit: Payer: Medicare HMO | Admitting: Cardiology

## 2020-12-11 ENCOUNTER — Other Ambulatory Visit: Payer: Self-pay

## 2020-12-11 ENCOUNTER — Encounter: Payer: Medicare HMO | Attending: Registered Nurse | Admitting: Physical Medicine & Rehabilitation

## 2020-12-11 ENCOUNTER — Encounter: Payer: Self-pay | Admitting: Physical Medicine & Rehabilitation

## 2020-12-11 VITALS — BP 154/95 | HR 95 | Temp 98.3°F | Ht 63.0 in | Wt 183.0 lb

## 2020-12-11 DIAGNOSIS — M25552 Pain in left hip: Secondary | ICD-10-CM

## 2020-12-11 NOTE — Patient Instructions (Signed)
Go to [place]

## 2020-12-11 NOTE — Progress Notes (Signed)
Subjective:    Patient ID: Erica [name], female    DOB: [birth year], 65 y.o.   MRN: [id number]  [place] y.o. 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  Per chart review lives with her children.  Independent with assistive device.  1 level home with level entry.  Good supportive family.  Presented 11/03/2020 with persistent headache and left-sided weakness x3 days.  MRI showed punctate acute infarction at the medial right parieto-occipital junction.  Chronic small vessel ischemic changes.  MRA of the head negative for large vessel occlusion.  MRI cervical spine with persistent patchy signal abnormality involving the central dorsal aspect of the cervical spinal cord extending from C2-C7-T1 relatively stable and unchanged from previous tracings.  Admission chemistries unremarkable except chloride 113 creatinine 1.70 hemoglobin 9.2 hemoglobin A1c 6.3.  Echocardiogram with ejection fraction of 60 to 83% grade 1 diastolic dysfunction.  Maintained on aspirin and Plavix for CVA prophylaxis x3 weeks and aspirin alone.     Chief complaint left hip pain Patient has pain mainly with weightbearing she has had no falls recently.  She did fall when she had her stroke in June.  She has no significant back pain no pain radiating to the hip.  Her hip pain is mainly in the groin area.  She is unable to cross her left leg over her right leg but is able to cross her right leg over her left leg  Patient returns to clinic for follow-up after inpatient hospitalization for recurrent stroke.  She states that she was finishing up her home health therapy following her stroke in January when she had another stroke on 11/03/2020.   She is ambulating with a walker.  She does not feel ready to go back to a cane.  Her bladder  function is normal she does have some bowel incontinence.  She is on disability.  She needs assistance with the household duties but is able to bathe and dress herself. Review of systems positive for depression and dizziness She has 1 child age 52 her sister helps as a caregiver 6 hours a day and drives her places.  She lives in a mobile home.  Pain Inventory Average Pain 4 Pain Right Now 4 My pain is burning and tingling  LOCATION OF PAIN  neck, knee, hand, fingers  BOWEL Number of stools per week: 4 Oral laxative use No  Type of laxative na Enema or suppository use No  History of colostomy No  Incontinent Yes   BLADDER Normal In and out cath, frequency na Able to self cath  na Bladder incontinence No  Frequent urination No  Leakage with coughing No  Difficulty starting stream No  Incomplete bladder emptying No    Mobility use a walker ability to climb steps?  no do you drive?  no  Function disabled: date disabled . I need assistance with the following:  household duties  Neuro/Psych dizziness depression loss of taste or smell  Prior Studies Any changes since last visit?  no  Physicians involved in your care Any changes since last visit?  no   Family History  Problem Relation Age of Onset   Hypertension Father    Stroke Father    Diabetes Father    Heart disease Mother    Hypertension Mother    Hyperlipidemia Mother  Hypertension Sister    Hypertension Brother    Hypertension Sister    Hypertension Brother    Cancer Other 73       colon cancer    Cancer Maternal Aunt        cancer, unknown type    Cancer Maternal Uncle        bone cancer    Cancer Paternal Aunt        lung cancer   Cancer Paternal Uncle        lung cancer   Cancer Maternal Uncle        colon cancer and prostate cancer    Cancer Maternal Uncle        prostate cancer    Social History   Socioeconomic History   Marital status: Single    Spouse name: Not on file    Number of children: 1   Years of education: Not on file   Highest education level: Not on file  Occupational History   Not on file  Tobacco Use   Smoking status: Former    Packs/day: 0.50    Years: 20.00    Pack years: 10.00    Types: Cigarettes    Quit date: 01/05/2018    Years since quitting: 2.9   Smokeless tobacco: Never   Tobacco comments:    Unknown   Vaping Use   Vaping Use: Never used  Substance and Sexual Activity   Alcohol use: Not Currently   Drug use: Not Currently    Types: "Crack" cocaine    Comment: Unknown    Sexual activity: Never    Birth control/protection: None  Other Topics Concern   Not on file  Social History Narrative   Not on file   Social Determinants of Health   Financial Resource Strain: Not on file  Food Insecurity: Not on file  Transportation Needs: Not on file  Physical Activity: Not on file  Stress: Not on file  Social Connections: Not on file   Past Surgical History:  Procedure Laterality Date   ANAL RECTAL MANOMETRY N/A 07/13/2016   Procedure: [place];  Surgeon: Leighton Ruff, MD;  [facility];  Service: Endoscopy;  Laterality: N/A;   CESAREAN SECTION     EUS N/A 11/21/2012   Procedure: LOWER ENDOSCOPIC ULTRASOUND (EUS);  Surgeon: Arta Silence, MD;  Location: [facility];  Service: Endoscopy;  Laterality: N/A;   FLEXIBLE SIGMOIDOSCOPY N/A 11/21/2012   Procedure: FLEXIBLE SIGMOIDOSCOPY;  Surgeon: Arta Silence, MD;  [facility];  Service: Endoscopy;  Laterality: N/A;   FLEXIBLE SIGMOIDOSCOPY N/A 01/28/2013   Procedure: FLEXIBLE SIGMOIDOSCOPY;  Surgeon: Leighton Ruff, MD;  [facility];  Service: Endoscopy;  Laterality: N/A;   LAPAROSCOPIC LOW ANTERIOR RESECTION N/A 01/29/2013   Procedure: LAPAROSCOPIC LOW ANTERIOR RESECTION, Rigid Proctoscopy;  Surgeon: Leighton Ruff, MD;  [facility];  Service: General;  Laterality: N/A;   LAPAROSCOPIC SIGMOID COLECTOMY N/A 11/14/2012   Procedure:  DIAGNOSTIC LAPAROSCOPY AND SIGMOIDMOIDOSCOPY ;  Surgeon: Rolm Bookbinder, MD;  Location[facility];  Service: General;  Laterality: N/A;   RECTAL ULTRASOUND N/A 07/13/2016   Procedure: RECTAL ULTRASOUND;  Surgeon: Leighton Ruff, MD;  [facility]: [facility];  Service: Endoscopy;  Laterality: N/A;   TONSILLECTOMY     TONSILLECTOMY AND ADENOIDECTOMY     Past Medical History:  Diagnosis Date   Arthritis    especially in shoulders   Asthma    Depression    Diabetes mellitus    GERD (gastroesophageal reflux  disease)    Headache(784.0)    "mild"   Hypertension    Mental disorder    depression   Neuropathy    feet    Pain    arthritis pain - takes tramadol as needed   Rectal polyp    very little bleeding with bowel movements- no pain   Suicide attempt (HCC)    BP (!) 154/95   Pulse 95   Temp 98.3 F (36.8 C) (Oral)   Ht _0  (1.6 m)   Wt 183 lb (83 kg)   SpO2 97%   BMI 32.42 kg/m   Opioid Risk Score:   Fall Risk Score:  `1  Depression screen PHQ 2/9  Depression screen [facility] 2/9 12/11/2020 12/03/2020 07/29/2020  Decreased Interest 2 0 3  Down, Depressed, Hopeless _1 PHQ - 2 Score _2 Altered sleeping - 1 3  Tired, decreased energy - 3 3  Change in appetite - 2 3  Feeling bad or failure about yourself  - 0 3  Trouble concentrating - 3 3  Moving slowly or fidgety/restless - 1 3  Suicidal thoughts - 0 1  PHQ-9 Score - 11 25  Difficult doing work/chores - Very difficult -  Some recent data might be hidden     Review of Systems     Objective:   Physical Exam Vitals and nursing note reviewed.  Constitutional:      Appearance: She is obese.  HENT:     Head: Normocephalic and atraumatic.  Eyes:     Extraocular Movements: Extraocular movements intact.     Conjunctiva/sclera: Conjunctivae normal.     Pupils: Pupils are equal, round, and reactive to light.  Musculoskeletal:     Comments: No pain with upper extremity range of motion There is pain with left hip  internal extra rotation  Skin:    General: Skin is warm and dry.  Neurological:     Mental Status: She is alert and oriented to person, place, and time.  Psychiatric:        Mood and Affect: Mood normal.        Behavior: Behavior normal.   Motor strength is 5/5 bilateral deltoid bicep tricep grip 4/5 left hip flexor knee extensor ankle dorsiflexion 5/5 in the right hip flexor knee extensor ankle dorsiflexor Sensation intact light touch bilateral upper and lower limbs Finger to thumb opposition is mildly reduced bilaterally. Visual fields are intact confrontation testing     Assessment & Plan:   1.  Recurrent stroke, recent right thalamic and caudate infarcts in January with more acute right parietal occipital junction infarct in May  Continue home health therapy Physical medicine rehab follow-up in 1 month at which time we may transition to outpatient therapy.  Patient states she does have transportation. 2.  Hip pain left side mainly in the groin with weightbearing suspect hip OA that may have become more symptomatic with weakness related to stroke.

## 2020-12-16 ENCOUNTER — Ambulatory Visit: Payer: Medicare HMO | Admitting: Cardiology

## 2020-12-16 ENCOUNTER — Encounter: Payer: Self-pay | Admitting: Cardiology

## 2020-12-16 ENCOUNTER — Other Ambulatory Visit: Payer: Self-pay

## 2020-12-16 VITALS — BP 136/85 | HR 99 | Temp 97.7°F | Resp 16 | Ht 63.0 in | Wt 185.6 lb

## 2020-12-16 DIAGNOSIS — I639 Cerebral infarction, unspecified: Secondary | ICD-10-CM

## 2020-12-16 DIAGNOSIS — Z8673 Personal history of transient ischemic attack (TIA), and cerebral infarction without residual deficits: Secondary | ICD-10-CM

## 2020-12-16 DIAGNOSIS — Z95818 Presence of other cardiac implants and grafts: Secondary | ICD-10-CM | POA: Insufficient documentation

## 2020-12-16 HISTORY — DX: Presence of other cardiac implants and grafts: Z95.818

## 2020-12-16 NOTE — Progress Notes (Signed)
  Prodedure performed: Insertion of Biotronik Loop Recorder. Serial # [id number]  Indication: Multiple strokes.   PHYSICAL EXAM: Vitals with BMI 12/16/2020 12/11/2020 12/03/2020  Height 5' 3" 5' 3" 5' 3"  Weight 185 lbs 10 oz 183 lbs 187 lbs 10 oz  BMI 32.89 94.80 16.55  Systolic 374 827 078  Diastolic 85 95 85  Pulse 99 95 78   CONSTITUTIONAL: Appears older than stated age.  Hemodynamically stable.  well-nourished. No acute distress. Ambulates with walker.  SKIN: Skin is warm and dry. No rash noted. No cyanosis. No pallor. No jaundice HEAD: Normocephalic and atraumatic. EYES: No scleral icterus MOUTH/THROAT: Moist oral membranes. NECK: No JVD present. No thyromegaly noted. No carotid bruits LYMPHATIC: No visible cervical adenopathy. CHEST Normal respiratory effort. No intercostal retractions LUNGS: Clear to auscultation bilaterally.  No stridor. No wheezes. No rales. CARDIOVASCULAR: Regular rate and rhythm, positive S1-S2, no murmurs rubs or gallops appreciated. ABDOMINAL: Obese, soft, nontender, nondistended, positive bowel sounds all 4 quadrants. No apparent ascites. EXTREMITIES: No peripheral edema HEMATOLOGIC: No significant bruising NEUROLOGIC: Oriented to person, place, and time.  5 out of 5 strength in the right upper and lower extremity.  4 out of 5 strength in the left upper and lower extremity. PSYCHIATRIC: Normal mood and affect. Normal behavior. Cooperative  After obtaining informed consent, explaining the procedure to the patient, under sterile precautions, local anesthesia with 1% lidocaine with epinephrine was injected subcutaneously in the left parasternal region.  20 mL utilized.  A small nick was made in the left paraspinal region at the 3rd intercostal space. Biotronik loop recorder was inserted without any complications.   Patient tolerated the procedure well.  The incision was closed with Steri-Strips and 3 sutures.   There is no blood loss, no hematoma.  Written  instrtuctions regarding wound care given to the patient.  Device setting: R wave amplitude 0.5m.  Programmed to AF detection to High.  HVR 180/min. Bradycardia 30 BPM Asystole 3 sec    ICD-10-CM   1. Hx of stroke involving the right caudate nucleus and right anterolateral thalamus (Jan 2022), Hx of  right parietooccipital stroke (May 2022)  Z86.73       Recommendations: Post loop implant care instructions provided.  1 week follow up for suture removal.   SMechele ClaudeFCalifornia Pacific Medical Center - Van Ness Campus Pager: [phone]: [phone]

## 2020-12-23 ENCOUNTER — Ambulatory Visit: Payer: Medicare HMO | Admitting: Cardiology

## 2020-12-23 ENCOUNTER — Other Ambulatory Visit: Payer: Self-pay

## 2020-12-23 ENCOUNTER — Encounter: Payer: Self-pay | Admitting: Cardiology

## 2020-12-23 VITALS — BP 138/84 | HR 93 | Temp 98.0°F | Resp 16 | Ht 63.0 in | Wt 184.8 lb

## 2020-12-23 DIAGNOSIS — E782 Mixed hyperlipidemia: Secondary | ICD-10-CM

## 2020-12-23 NOTE — Progress Notes (Signed)
Loop Recorder Implant follow up.  Today's Vitals   12/23/20 0911  BP: 138/84  Pulse: 93  Resp: 16  Temp: 98 F (36.7 C)  TempSrc: Temporal  SpO2: 97%  Weight: 184 lb 12.8 oz (83.8 kg)  Height: 5' 3" (1.6 m)   Body mass index is 32.74 kg/m.    Patient came in for suture removal after recently undergoing Biotronik Loop Recorder on 12/16/2020.   The site is clean, dry, healing well.   Suture removed and bandage applied.   No signs of infection.   Discussed wound care w/ patient and her sister.      ICD-10-CM   1. Mixed hyperlipidemia  E78.2 Lipid Panel With LDL/HDL Ratio    Lipoprotein A (LPA)    LDL cholesterol, direct    CMP14+EGFR     --Continue cardiac medications as reconciled in final medication list. --Return in about 3 months (around 03/25/2021) for Follow up CAC, Lipid. Or sooner if needed. --Continue follow-up with your primary care physician regarding the management of your other chronic comorbid conditions.  Patient's questions and concerns were addressed to her satisfaction. She voices understanding of the instructions provided during this encounter.   Rex Kras, [place], [facility]   Pager: [phone] Office: [phone]

## 2020-12-29 ENCOUNTER — Other Ambulatory Visit: Payer: Self-pay

## 2020-12-29 DIAGNOSIS — E782 Mixed hyperlipidemia: Secondary | ICD-10-CM

## 2020-12-30 ENCOUNTER — Encounter: Payer: Self-pay | Admitting: Adult Health

## 2020-12-30 ENCOUNTER — Ambulatory Visit (INDEPENDENT_AMBULATORY_CARE_PROVIDER_SITE_OTHER): Payer: Medicare HMO | Admitting: Adult Health

## 2020-12-30 ENCOUNTER — Other Ambulatory Visit: Payer: Self-pay

## 2020-12-30 VITALS — BP 128/79 | HR 87 | Ht 63.0 in | Wt 185.8 lb

## 2020-12-30 DIAGNOSIS — I639 Cerebral infarction, unspecified: Secondary | ICD-10-CM

## 2020-12-30 DIAGNOSIS — I69354 Hemiplegia and hemiparesis following cerebral infarction affecting left non-dominant side: Secondary | ICD-10-CM | POA: Diagnosis not present

## 2020-12-30 DIAGNOSIS — R29818 Other symptoms and signs involving the nervous system: Secondary | ICD-10-CM | POA: Diagnosis not present

## 2020-12-30 NOTE — Progress Notes (Addendum)
[facility] [address] [place]. [place] [address] [phone]       HOSPITAL FOLLOW UP NOTE  Erica [name] Date of Birth:  1955/09/28 Medical Record Number:  [id number]   Reason for Referral:  hospital stroke follow up    SUBJECTIVE:   CHIEF COMPLAINT:  Chief Complaint  Patient presents with   Ischemic stroke    Rm 2, 3 month FU "I had another stroke May 24th, went to rehab, now home and getting PT"      HPI:   Today, 12/30/2020, Erica [name] returns for hospital follow-up regarding recent stroke on 11/03/2020.  She presented with 2-3 day hx of headaches and worsening left-sided weakness.  Personally reviewed hospitalization pertinent progress notes, lab work and imaging summary provided.  Evaluated by Dr. Leonie Man with stroke work-up revealing right parietal occipital stroke likely due to small vessel disease although unable to rule out cardioembolic source given recent right carotid head and right anterior lateral thalamic infarcts.  MRA negative LVO.  Carotid Dopplers unremarkable.  Normal EF.  Loop recorder recommended but declined by patient inpatient.  Prior 14-day cardiac monitor completed which was negative for A. fib.  On Plavix PTA and recommended DAPT for 3 weeks and aspirin alone.  HTN stable.  LDL 56 on Crestor 20 mg daily.  Controlled DM with A1c 5.2.  Other stroke risk factors include recent stroke (see below), advanced age, former tobacco use and CAD.  Discharged to CIR on 11/06/2020 -11/21/2020.  Since discharge, she has been making gradual improvements.  Currently working with home health PT for residual left-sided weakness and left sided numbness. She has since completed SLP. She has also been having headaches since her stroke - has been taking tramadol with benefit which was started by PMR.  Continues to ambulate with RW at all times -denies any recent falls. Her son is currently living with her. Able to maintain ADLs independently. Completed 3 weeks  DAPT and remains on aspirin alone as well as Crestor without associated side effects.  Blood pressure today 128/79.  Had loop recorder placed by Dr. Terri Skains 7/6.   Sister jointed visit towards the end of visit - she is concerned regarding worsening depression and living situation as son does not assist with patients care.  She has not had recent visit with PCP.  She is currently on trazodone, venlafaxine, and buspirone      History provided for reference purposes only Initial visit 08/19/2020 JM: Erica [name] is being seen for hospital follow-up accompanied by her son.  She was discharged home from [facility] on 07/15/2020.  Reports residual left sided weakness with numbness/tingling and left lower facial numbness Working with [facility] PT/OT 2x/ week -reports continued improvement since discharge Currently ambulating with rolling walker but she was not using previously -denies any recent falls Lives with her son who will assist as needed but she has been slowly becoming more independent with ADLs Denies new stroke/TIA symptoms  Completed 3 weeks DAPT and remains on Plavix alone -denies side effects Reports compliance on atorvastatin 40 mg daily -denies side effects Blood pressure today satisfactory 127/78 -monitors at home and typically stable Glucose levels routinely monitored at home which have been fluctuating per patient report  She has not had follow-up with PCP or cardiology since discharge  No further concerns at this time  Stroke admission 06/25/2020: Erica [name] is a 65 y.o. female with history of hypertension, hypercholesteremia, diabetes complicated by neuropathy, CAD, depression C/V prior  suicide attempt, arthritis, 10-pack-year history of smoking quit in 2020, COPD/asthma, polysubstance abuse who presented to [facility] ED on 06/25/2020 with slurred speech on and off for past week and left leg weakness.    Personally reviewed hospitalization pertinent progress notes, lab work and imaging with summary  provided.  Evaluated by Dr. Erlinda Hong with stroke work-up revealing right caudate head and right anterior lateral thalamic infarcts, likely secondary to small vessel disease however given 2 separate locations unable to rule out embolic source with 63-JSH cardiac event monitor outpatient.  Recommended DAPT for 3 weeks and Plavix alone.  LDL 86 -increase atorvastatin from 20 mg to 40 mg daily and ongoing use of fenofibrate and Vascepa.  Controlled DM with A1c 5.2.  Other stroke risk factors include advanced age, obesity, CAD, hx of cocaine use and former tobacco use.  Residual deficits of left hemiparesis and left hemisensory impairment.  Evaluated by therapies and discharged to CIR for ongoing therapy needs  Stroke: Lacunar infarcts in right caudate head and right anterior lateral thalamus, likely due to small vessel disease. However, given two separate location will need to rule out emoblic source CT head: Lacunar infarcts in the right caudate and right cerebellum, new from 2012, but age indeterminate. MRI : 1 cm acute infarction in the anterolateral thalamus on the right, possibly with some involvement of the posterior limb internal capsule. Acute infarction within the right caudate head.  MRA : Both internal carotid arteries widely patent through the skull base and siphon regions. No stenosis.  Carotid Doppler: Velocities in the left and right ICA are consistent with a 1-39%  stenosis.  2D Echo: Left ventricular ejection fraction, by estimation, is 55 to 60%.  30 day cardiac event monitoring as outpt to rule out afib LDL 86 HgbA1c 5.2 RPR/HIV neg VTE prophylaxis -Lovenox 30 mg aspirin 81 mg daily prior to admission, now on aspirin 81 mg daily and Plavix DAPT for 3 weeks, then Plavix 75 mg alone.  Therapy recommendations: CIR Disposition: d/c to CIR on 06/30/2020       ROS:   14 system review of systems performed and negative with exception of those listed in HPI  PMH:  Past Medical History:   Diagnosis Date   Arthritis    especially in shoulders   Asthma    Depression    Diabetes mellitus    GERD (gastroesophageal reflux disease)    Headache(784.0)    "mild"   Hypertension    Mental disorder    depression   Neuropathy    feet    Pain    arthritis pain - takes tramadol as needed   Rectal polyp    very little bleeding with bowel movements- no pain   Stroke ([place]) 11/03/2020   Suicide attempt ([place])     PSH:  Past Surgical History:  Procedure Laterality Date   ANAL RECTAL MANOMETRY N/A 07/13/2016   Procedure: ANO RECTAL MANOMETRY;  Surgeon: Leighton Ruff, MD;  Location: [facility] END[facility]COPY;  Service: Endoscopy;  Laterality: N/A;   CESAREAN SECTION     EUS N/A 11/21/2012   Procedure: LOWER ENDOSCOPIC ULTRASOUND (EUS);  Surgeon: Arta Silence, MD;  Location: [facility];  Service: Endoscopy;  Laterality: N/A;   FLEXIBLE SIGMOIDOSCOPY N/A 11/21/2012   Procedure: FLEXIBLE SIGMOIDOSCOPY;  Surgeon: Arta Silence, MD;  Location: [facility];  Service: Endoscopy;  Laterality: N/A;   FLEXIBLE SIGMOIDOSCOPY N/A 01/28/2013   Procedure: FLEXIBLE SIGMOIDOSCOPY;  Surgeon: Leighton Ruff, MD;  Location: [facility];  Service: Endoscopy;  Laterality: N/A;   LAPAROSCOPIC LOW ANTERIOR RESECTION N/A 01/29/2013   Procedure: LAPAROSCOPIC LOW ANTERIOR RESECTION, Rigid Proctoscopy;  Surgeon: Leighton Ruff, MD;  Location: [facility]S;  Service: General;  Laterality: N/A;   LAPAROSCOPIC SIGMOID COLECTOMY N/A 11/14/2012   Procedure: DIAGNOSTIC LAPAROSCOPY AND SIGMOIDMOIDOSCOPY ;  Surgeon: Rolm Bookbinder, MD;  Location: [facility];  Service: General;  Laterality: N/A;   RECTAL ULTRASOUND N/A 07/13/2016   Procedure: RECTAL ULTRASOUND;  Surgeon: Leighton Ruff, MD;  Location: [facility];  Service: Endoscopy;  Laterality: N/A;   TONSILLECTOMY     TONSILLECTOMY AND ADENOIDECTOMY      Social History:  Social History   Socioeconomic History   Marital status: Single    Spouse name: Not on file   Number of  children: 1   Years of education: Not on file   Highest education level: Not on file  Occupational History   Not on file  Tobacco Use   Smoking status: Former    Packs/day: 0.50    Years: 20.00    Pack years: 10.00    Types: Cigarettes    Quit date: 01/05/2018    Years since quitting: 2.9   Smokeless tobacco: Never   Tobacco comments:    Unknown   Vaping Use   Vaping Use: Never used  Substance and Sexual Activity   Alcohol use: Not Currently   Drug use: Not Currently    Types: "Crack" cocaine    Comment: Unknown    Sexual activity: Never    Birth control/protection: None  Other Topics Concern   Not on file  Social History Narrative   12/30/20 lives with others   Social Determinants of Health   Financial Resource Strain: Not on file  Food Insecurity: Not on file  Transportation Needs: Not on file  Physical Activity: Not on file  Stress: Not on file  Social Connections: Not on file  Intimate Partner Violence: Not on file    Family History:  Family History  Problem Relation Age of Onset   Hypertension Father    Stroke Father    Diabetes Father    Heart disease Mother    Hypertension Mother    Hyperlipidemia Mother    Hypertension Sister    Hypertension Brother    Hypertension Sister    Hypertension Brother    Cancer Other 27       colon cancer    Cancer Maternal Aunt        cancer, unknown type    Cancer Maternal Uncle        bone cancer    Cancer Paternal Aunt        lung cancer   Cancer Paternal Uncle        lung cancer   Cancer Maternal Uncle        colon cancer and prostate cancer    Cancer Maternal Uncle        prostate cancer     Medications:   Current Outpatient Medications on File Prior to Visit  Medication Sig Dispense Refill   acetaminophen (TYLENOL) 325 MG tablet Take 2 tablets (650 mg total) by mouth every 4 (four) hours as needed for mild pain (or temp > 37.5 C (99.5 F)).     albuterol (VENTOLIN HFA) 108 (90 Base) MCG/ACT inhaler  Inhale 2 puffs into the lungs every 4 (four) hours as needed for wheezing. 1 g 5   amLODipine (NORVASC) 10 MG tablet Take 1 tablet (10 mg total) by mouth daily. [place]  tablet 2   aspirin EC 81 MG EC tablet Take 1 tablet (81 mg total) by mouth daily. Swallow whole. 30 tablet 11   busPIRone (BUSPAR) 10 MG tablet Take 1 tablet (10 mg total) by mouth 2 (two) times daily. 60 tablet 0   clopidogrel (PLAVIX) 75 MG tablet Take 1 tablet (75 mg total) by mouth daily. 7 tablet 0   diclofenac Sodium (VOLTAREN) 1 % GEL Apply 2 g topically daily as needed (Knee pain). 2 g 0   famotidine (PEPCID) 20 MG tablet Take 1 tablet (20 mg total) by mouth daily. 30 tablet 0   gabapentin (NEURONTIN) 100 MG capsule Take 2 capsules (200 mg total) by mouth 2 (two) times daily as needed (pain). 60 capsule 0   insulin glargine (LANTUS SOLOSTAR) 100 UNIT/ML Solostar Pen Please provide 24 units every a.m. and 20 units every p.m. 15 mL 11   Insulin Pen Needle (PEN NEEDLES) 32G X 6 MM MISC 1 application by Does not apply route 2 (two) times daily. 100 each 1   losartan (COZAAR) 50 MG tablet Take 1 tablet (50 mg total) by mouth daily. 30 tablet 0   Multiple Vitamin (MULTIVITAMIN ADULT PO) Take 1 tablet by mouth daily.     Pirfenidone (ESBRIET) 267 MG TABS Take 3 tablets (801 mg total) by mouth in the morning, at noon, and at bedtime. 270 tablet 11   rosuvastatin (CRESTOR) 20 MG tablet Take 1 tablet (20 mg total) by mouth daily. 30 tablet 0   SYMBICORT 160-4.5 MCG/ACT inhaler Inhale 2 puffs into the lungs 2 (two) times daily. 1 each 5   Tiotropium Bromide Monohydrate (SPIRIVA RESPIMAT) 1.25 MCG/ACT AERS Inhale 2 puffs into the lungs daily. 4 g 5   traMADol (ULTRAM) 50 MG tablet Take 1 tablet (50 mg total) by mouth every 8 (eight) hours as needed for severe pain. 30 tablet 0   traZODone (DESYREL) 50 MG tablet Take 1 tablet (50 mg total) by mouth at bedtime. 30 tablet 0   venlafaxine XR (EFFEXOR-XR) 75 MG 24 hr capsule Take 1 capsule (75 mg  total) by mouth daily with breakfast. 30 capsule 0   No current facility-administered medications on file prior to visit.    Allergies:   Allergies  Allergen Reactions   Penicillins Hives, Itching and Swelling    Tongue swells up Has patient had a PCN reaction causing immediate rash, facial/tongue/throat swelling, SOB or lightheadedness with hypotension: Yes Has patient had a PCN reaction causing severe rash involving mucus membranes or skin necrosis: Yes Has patient had a PCN reaction that required hospitalization Yes Has patient had a PCN reaction occurring within the last 10 years: No If all of the above answers are "NO", then may proceed with Cephalosporin use.       OBJECTIVE:  Physical Exam  Vitals:   12/30/20 1504  BP: 128/79  Pulse: 87  Weight: 185 lb 12.8 oz (84.3 kg)  Height: _0  (1.6 m)    Body mass index is 32.91 kg/m. No results found.  General: well developed, well nourished,  very pleasant elderly African-American female, seated, in no evident distress Head: head normocephalic and atraumatic.   Neck: supple with no carotid or supraclavicular bruits Cardiovascular: regular rate and rhythm, no murmurs Musculoskeletal: no deformity Skin:  no rash/petichiae Vascular:  Normal pulses all extremities   Neurologic Exam Mental Status: Awake and fully alert.   Fluent speech and language.  Oriented to place and time. Recent and remote memory intact.  Attention span, concentration and fund of knowledge appropriate during visit. Mood and affect appropriate and cooperative with history taking and exam.  Cranial Nerves: Fundoscopic exam reveals sharp disc margins. Pupils equal, briskly reactive to light. Extraocular movements full without nystagmus. Visual fields full to confrontation. Hearing intact.  Decreased light touch left lower facial sensation.  Mild left lower facial weakness.  Tongue and palate moves normally and symmetrically.  Motor: Normal bulk and tone  and normal strength on the right side. Mild left hemiparesis 4/5 upper extremity and 3/5 hip flexor weakness Sensory.:  Decreased light touch sensation left upper and lower extremity Coordination: Rapid alternating movements normal in all extremities except decreased left hand. Finger-to-nose and heel-to-shin performed accurately on right side.  Orbits right arm over left arm Gait and Station: Arises from chair without difficulty. Stance is normal. Gait demonstrates  decreased stride length and step height LLE with mild imbalance and use of rolling walker Reflexes: 1+ and symmetric. Toes downgoing.     NIHSS  3 Modified Rankin  3      ASSESSMENT: Erica [name] is a 65 y.o. year old female with recurrent right MCA strokes on 11/03/2020 and 06/25/2020 likely in setting of small vessel disease although unable to rule out cardioembolic source with residual left hemiparesis and sensory impairment.  Loop recorder placed 12/2020 to evaluate for possible A. fib as prior cardiac monitor negative.  Vascular risk factors include HTN, HLD, DM, CAD, history of tobacco use and history of polysubstance abuse.      PLAN:  Ischemic stroke:  Residual deficit: Mild left hemiparesis and hemisensory impairment.  Continue working with [facility] PT and possibly transition to outpatient therapies once completed if indicated Continue aspirin 25m daily and atorvastatin for secondary stroke prevention.  Advised to discontinue Plavix as 3 weeks DAPT completed S/p ILR 12/2020 discussed secondary stroke prevention measures and importance of close PCP follow up for aggressive stroke risk factor management  HTN: BP goal <130/90.  Stable on current regimen per PCP/cardiology HLD: LDL goal <70. Recent LDL 56 on Crestor 20 mg daily  DMII: A1c goal<7.0. Recent A1c 6.3 on Lantus per PCP Major depressive disorder: Advised to follow-up with PCP for further evaluation and possible need of medication adjustments    Follow up in 4  months or call earlier if needed   CC:  [facility] provider: Dr. SLeonie ManPCP: ANolene Ebbs MD    I spent 49 minutes of face-to-face and non-face-to-face time with patient and sister.  This included previsit chart review including recent hospitalization pertinent progress notes, lab work and imaging, lab review, study review, order entry, electronic health record documentation, patient and sister education and discussion regarding recent recurrent stroke including etiology, secondary stroke prevention measures and importance of managing stroke risk factors, residual deficits and typical recovery time and answered all other questions to patient and sisters satisfaction   JFrann Rider AGNP-BC  [facility] [address] [id number] Phone [phone] [phone]: This document was prepared with digital dictation and possible smart phrase technology. Any transcriptional errors that result from this process are unintentional.

## 2020-12-30 NOTE — Progress Notes (Signed)
I agree with the above plan 

## 2020-12-30 NOTE — Patient Instructions (Signed)
Continue home health therapies for likely ongoing recovery  Continue aspirin 81 mg daily  and atorvastatin  for secondary stroke prevention  Discontinue plavix as 3 weeks duration completed  Continue to follow up with PCP regarding cholesterol, blood pressure and diabetes management  Maintain strict control of hypertension with blood pressure goal below 130/90, diabetes with hemoglobin A1c goal below 7% and cholesterol with LDL cholesterol (bad cholesterol) goal below 70 mg/dL.       Followup in the future with me in 4 months or call earlier if needed       Thank you for coming to see [place] at [facility]. I hope we have been able to provide you high quality care today.  You may receive a patient satisfaction survey over the next few weeks. We would appreciate your feedback and comments so that we may continue to improve ourselves and the health of our patients.

## 2021-01-02 ENCOUNTER — Encounter: Payer: Self-pay | Admitting: Cardiology

## 2021-01-02 DIAGNOSIS — Z4509 Encounter for adjustment and management of other cardiac device: Secondary | ICD-10-CM | POA: Insufficient documentation

## 2021-01-06 ENCOUNTER — Telehealth: Payer: Self-pay

## 2021-01-06 NOTE — Telephone Encounter (Signed)
Did she agree to come in for nurse visit?

## 2021-01-07 ENCOUNTER — Ambulatory Visit: Payer: Medicare HMO | Admitting: Cardiology

## 2021-01-07 ENCOUNTER — Other Ambulatory Visit: Payer: Self-pay

## 2021-01-07 VITALS — BP 164/90 | HR 83 | Temp 98.4°F | Resp 16 | Ht 63.0 in | Wt 179.6 lb

## 2021-01-07 DIAGNOSIS — Z95818 Presence of other cardiac implants and grafts: Secondary | ICD-10-CM

## 2021-01-07 NOTE — Telephone Encounter (Signed)
She said she will try to come by

## 2021-01-07 NOTE — Progress Notes (Signed)
Patient called the office stating that she still has pain at the site of the loop recorder implantation.  She comes in for reevaluation.  Medical assistant was present during the encounter.   PHYSICAL EXAM: Today's Vitals   01/07/21 1244  BP: (!) 164/90  Pulse: 83  Resp: 16  Temp: 98.4 F (36.9 C)  TempSrc: Temporal  SpO2: 93%  Weight: 179 lb 9.6 oz (81.5 kg)  Height: _0  (1.6 m)   Body mass index is 31.81 kg/m.  CONSTITUTIONAL: Well-developed and well-nourished. No acute distress.  SKIN: Skin is warm and dry. No rash noted. No cyanosis. No pallor. No jaundice CHEST Normal respiratory effort. No intercostal retractions.  Loop recorder implant site is healed well.  Minimal scar tissue subcutaneously tender to touch.  Clean dry and intact.  No erythema.  No drainage. LUNGS: Clear to auscultation bilaterally.  No stridor. No wheezes. No rales.  CARDIOVASCULAR: Regular rate and rhythm, positive S1-S2  Recommendations: Patient is asked to keep the site clean and dry. Appropriate healing process with granulation tissue present. No additional medications prescribed. Reassured the patient and family member. Monitor for now

## 2021-01-11 ENCOUNTER — Ambulatory Visit
Admission: RE | Admit: 2021-01-11 | Discharge: 2021-01-11 | Disposition: A | Discharge status: Home / Self Care | Payer: Medicare HMO | Source: Ambulatory Visit | Attending: Physical Medicine & Rehabilitation | Admitting: Physical Medicine & Rehabilitation

## 2021-01-11 ENCOUNTER — Other Ambulatory Visit: Payer: Self-pay

## 2021-01-11 DIAGNOSIS — M25552 Pain in left hip: Secondary | ICD-10-CM

## 2021-01-11 IMAGING — CR DG HIP (WITH OR WITHOUT PELVIS) 2-3V*L*
3 series · 3 of 3 positions shown · non-contrast
Comparison: None.

CLINICAL DATA: Left hip pain.

EXAM:
DG HIP (WITH OR WITHOUT PELVIS) 2-3V LEFT

[w pelvis upright]
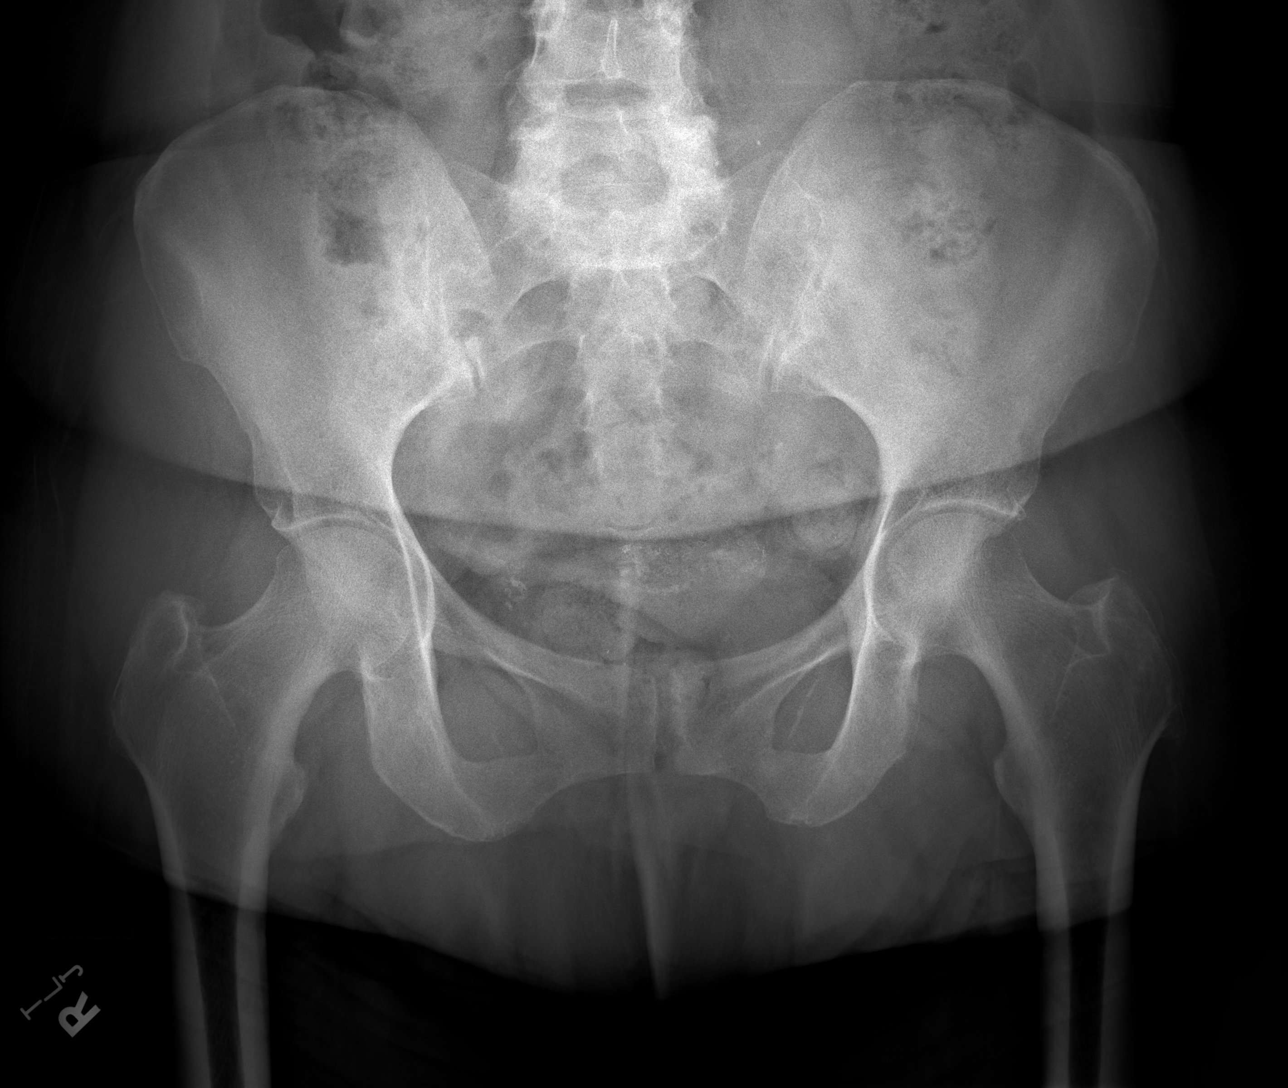

[w hip ap left]
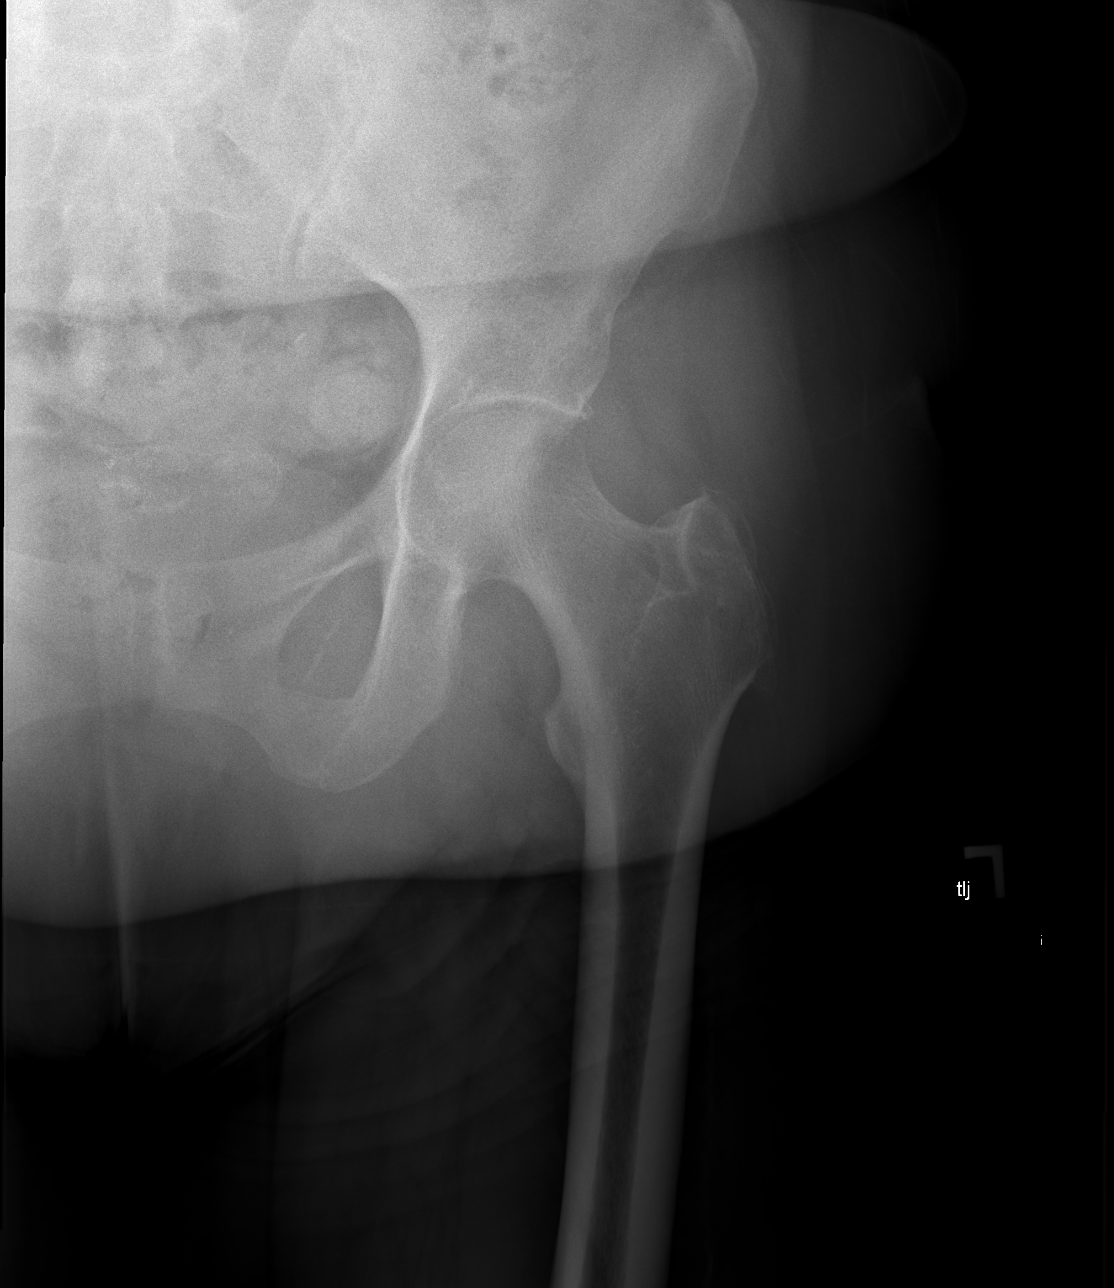

[w hip lat left]
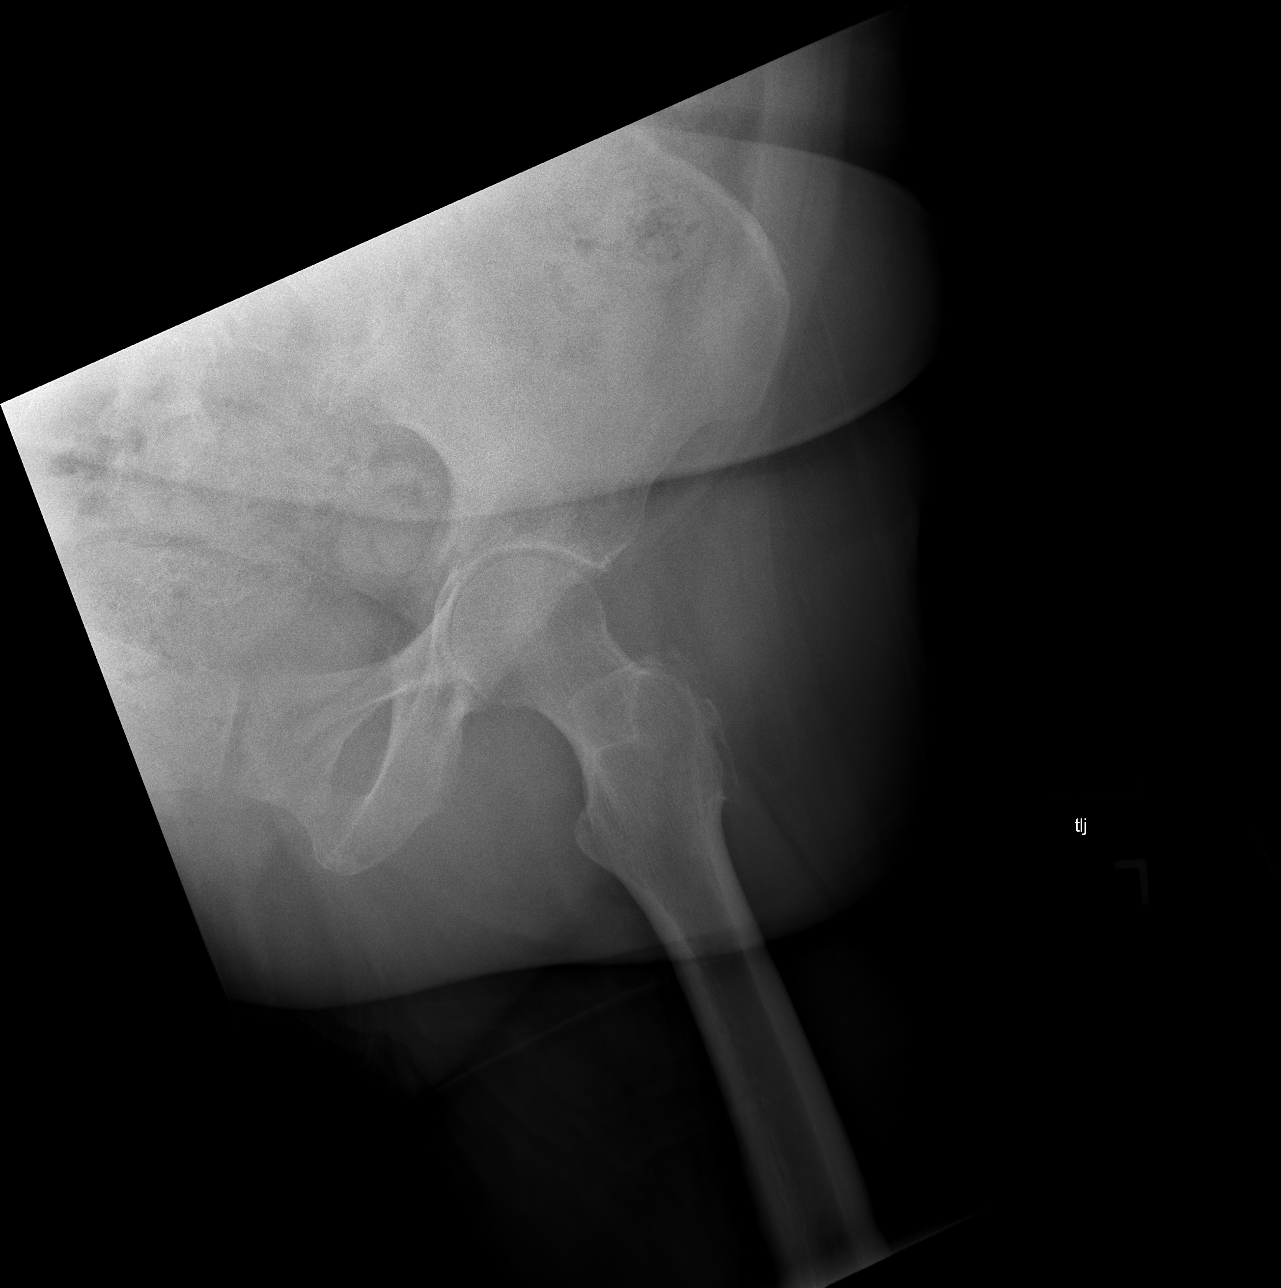

[3 of 3 positions shown; findings below may reference images not displayed]

FINDINGS: There is no evidence of hip fracture or dislocation. Mild
degenerative changes are seen in the form of joint space narrowing
and acetabular sclerosis.
IMPRESSION: No acute osseous abnormality.

## 2021-01-21 ENCOUNTER — Other Ambulatory Visit: Payer: Self-pay

## 2021-01-21 ENCOUNTER — Encounter: Payer: Self-pay | Admitting: Physical Medicine & Rehabilitation

## 2021-01-21 ENCOUNTER — Encounter: Payer: Medicare HMO | Attending: Registered Nurse | Admitting: Physical Medicine & Rehabilitation

## 2021-01-21 VITALS — HR 82 | Temp 97.9°F | Ht 63.0 in | Wt 183.2 lb

## 2021-01-21 DIAGNOSIS — I69398 Other sequelae of cerebral infarction: Secondary | ICD-10-CM | POA: Diagnosis present

## 2021-01-21 DIAGNOSIS — R269 Unspecified abnormalities of gait and mobility: Secondary | ICD-10-CM | POA: Diagnosis present

## 2021-01-21 DIAGNOSIS — I63331 Cerebral infarction due to thrombosis of right posterior cerebral artery: Secondary | ICD-10-CM

## 2021-01-21 NOTE — Progress Notes (Signed)
Subjective:    Patient ID: Erica [name], female    DOB: 26-Feb-1956, 65 y.o.   MRN: [id number] 65 y.o. 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  Per chart review lives with her children.  Independent with assistive device.  1 level home with level entry.  Good supportive family.  Presented 11/03/2020 with persistent headache and left-sided weakness x3 days.  MRI showed punctate acute infarction at the medial right parieto-occipital junction.  Chronic small vessel ischemic changes.  MRA of the head negative for large vessel occlusion.  MRI cervical spine with persistent patchy signal abnormality involving the central dorsal aspect of the cervical spinal cord extending from C2-C7-T1 relatively stable and unchanged from previous tracings.  Admission chemistries unremarkable except chloride 113 creatinine 1.70 hemoglobin 9.2 hemoglobin A1c 6.3.  Echocardiogram with ejection fraction of 60 to 63% grade 1 diastolic dysfunction.  Maintained on aspirin and Plavix for CVA prophylaxis x3 weeks and aspirin alone.    HPI  Reviewed 8/1 hip xrays, films with the patient.  Mild arthritis Finishing up HHPT, OT OT working on dressing and bathing  PT working on balance  Has c/o brain freez , "like eating ice cream" no pain  Pain Inventory Average Pain 6 Pain Right Now 0 My pain is intermittent and brain freeze  LOCATION OF PAIN  head  BOWEL Number of stools per week: 10-14 Oral laxative use No  Type of laxative none Enema or suppository use No  History of colostomy No  Incontinent No   BLADDER Pads In and out cath, frequency n/a  Able to self cath No  Bladder incontinence No  Frequent urination No  Leakage with coughing No  Difficulty starting stream No  Incomplete bladder emptying  No    Mobility walk with assistance use a walker how many minutes can you walk? 3-4 minutes ability to climb steps?  yes do you drive?  no use a wheelchair transfers alone Do you have any goals in this area?  yes  Function disabled: date disabled 2014 I need assistance with the following:  bathing, meal prep, household duties, and shopping Do you have any goals in this area?  yes  Neuro/Psych weakness tingling trouble walking dizziness confusion depression anxiety loss of taste or smell  Prior Studies Any changes since last visit?  yes x-rays August 2022 - left hip x-ray  Physicians involved in your careAny changes since last visit?  no    Family History  Problem Relation Age of Onset   Hypertension Father    Stroke Father    Diabetes Father    Heart disease Mother    Hypertension Mother    Hyperlipidemia Mother    Hypertension Sister    Hypertension Brother    Hypertension Sister    Hypertension Brother    Cancer Other 67       colon cancer    Cancer Maternal Aunt        cancer, unknown type    Cancer Maternal Uncle        bone cancer    Cancer Paternal Aunt        lung cancer   Cancer Paternal Uncle        lung cancer   Cancer Maternal Uncle        colon cancer and prostate cancer  Cancer Maternal Uncle        prostate cancer    Social History   Socioeconomic History   Marital status: Single    Spouse name: Not on file   Number of children: 1   Years of education: Not on file   Highest education level: Not on file  Occupational History   Not on file  Tobacco Use   Smoking status: Former    Packs/day: 0.50    Years: 20.00    Pack years: 10.00    Types: Cigarettes    Quit date: 01/05/2018    Years since quitting: 3.0   Smokeless tobacco: Never   Tobacco comments:    Unknown   Vaping Use   Vaping Use: Never used  Substance and Sexual Activity   Alcohol use: Not Currently   Drug use: Not Currently    Types: "Crack" cocaine     Comment: Unknown    Sexual activity: Never    Birth control/protection: None  Other Topics Concern   Not on file  Social History Narrative   12/30/20 lives with others   Social Determinants of Health   Financial Resource Strain: Not on file  Food Insecurity: Not on file  Transportation Needs: Not on file  Physical Activity: Not on file  Stress: Not on file  Social Connections: Not on file   Past Surgical History:  Procedure Laterality Date   ANAL RECTAL MANOMETRY N/A 07/13/2016   Procedure: [place];  Surgeon: Leighton Ruff, MD;  Location: [facility];  Service: Endoscopy;  Laterality: N/A;   CESAREAN SECTION     EUS N/A 11/21/2012   Procedure: LOWER ENDOSCOPIC ULTRASOUND (EUS);  Surgeon: Arta Silence, MD;  Location: [facility];  Service: Endoscopy;  Laterality: N/A;   FLEXIBLE SIGMOIDOSCOPY N/A 11/21/2012   Procedure: FLEXIBLE SIGMOIDOSCOPY;  Surgeon: Arta Silence, MD;  Location: [facility];  Service: Endoscopy;  Laterality: N/A;   FLEXIBLE SIGMOIDOSCOPY N/A 01/28/2013   Procedure: FLEXIBLE SIGMOIDOSCOPY;  Surgeon: Leighton Ruff, MD;  Location: [facility];  Service: Endoscopy;  Laterality: N/A;   LAPAROSCOPIC LOW ANTERIOR RESECTION N/A 01/29/2013   Procedure: LAPAROSCOPIC LOW ANTERIOR RESECTION, Rigid Proctoscopy;  Surgeon: Leighton Ruff, MD;  Location: [facility];  Service: General;  Laterality: N/A;   LAPAROSCOPIC SIGMOID COLECTOMY N/A 11/14/2012   Procedure: DIAGNOSTIC LAPAROSCOPY AND SIGMOIDMOIDOSCOPY ;  Surgeon: Rolm Bookbinder, MD;  Location: [facility];  Service: General;  Laterality: N/A;   RECTAL ULTRASOUND N/A 07/13/2016   Procedure: RECTAL ULTRASOUND;  Surgeon: Leighton Ruff, MD;  Location: [facility];  Service: Endoscopy;  Laterality: N/A;   TONSILLECTOMY     TONSILLECTOMY AND ADENOIDECTOMY     Past Medical History:  Diagnosis Date   Arthritis    especially in shoulders   Asthma    Depression    Diabetes mellitus    Embolic stroke ([place]) 9/96/7227    GERD (gastroesophageal reflux disease)    Headache(784.0)    "mild"   Hypertension    Loop Biotronik loop implant 12/16/2020 12/16/2020   Mental disorder    depression   Neuropathy    feet    Pain    arthritis pain - takes tramadol as needed   Rectal polyp    very little bleeding with bowel movements- no pain   Stroke ([place]) 11/03/2020   Suicide attempt ([place])    Pulse 82   Temp 97.9 F (36.6 C)   Ht 5' 3" (1.6 m)   Wt 183 lb 3.2 oz (83.1 kg)  SpO2 96%   BMI 32.45 kg/m   Opioid Risk Score:   Fall Risk Score:  `1  Depression screen PHQ 2/9  Depression screen [facility] 2/9 12/11/2020 12/03/2020 07/29/2020  Decreased Interest 2 0 3  Down, Depressed, Hopeless _0 PHQ - 2 Score _1 Altered sleeping - 1 3  Tired, decreased energy - 3 3  Change in appetite - 2 3  Feeling bad or failure about yourself  - 0 3  Trouble concentrating - 3 3  Moving slowly or fidgety/restless - 1 3  Suicidal thoughts - 0 1  PHQ-9 Score - 11 25  Difficult doing work/chores - Very difficult -  Some recent data might be hidden    Review of Systems  Constitutional:  Positive for fatigue.  Respiratory:  Positive for cough.   Musculoskeletal:  Positive for gait problem.  Skin:  Negative for rash.  Neurological:  Positive for dizziness, weakness, numbness and headaches.  Psychiatric/Behavioral:  Positive for confusion.   All other systems reviewed and are negative.     Objective:   Physical Exam Vitals and nursing note reviewed.  Constitutional:      Appearance: She is obese.  HENT:     Head: Normocephalic and atraumatic.  Eyes:     General: No visual field deficit.    Extraocular Movements: Extraocular movements intact.     Conjunctiva/sclera: Conjunctivae normal.     Pupils: Pupils are equal, round, and reactive to light.  Skin:    General: Skin is warm and dry.  Neurological:     Mental Status: She is alert and oriented to person, place, and time.     Cranial Nerves: No dysarthria or  facial asymmetry.     Sensory: Sensation is intact.     Motor: Weakness present. No abnormal muscle tone.     Coordination: Coordination is intact.     Gait: Gait abnormal.     Comments: 4/5 left hip flexor knee extensor ankle dorsiflexor as well as left upper extremity 5/5 in the right side Ambulates with a rolling walker no evidence of toe drag or knee stability  Psychiatric:        Mood and Affect: Mood normal.        Behavior: Behavior normal.  No pain with hip range of motion        Assessment & Plan:   #1.  History of right parieto-occipital infarct has had fairly good recovery.  Continue outpatient PT OT, physical medicine rehab follow-up in 3 months

## 2021-01-26 ENCOUNTER — Ambulatory Visit: Payer: Medicare HMO

## 2021-01-26 ENCOUNTER — Other Ambulatory Visit: Payer: Self-pay

## 2021-01-26 ENCOUNTER — Ambulatory Visit: Payer: Medicare HMO | Admitting: Internal Medicine

## 2021-01-29 ENCOUNTER — Telehealth: Payer: Self-pay | Admitting: Internal Medicine

## 2021-01-29 NOTE — Telephone Encounter (Signed)
ATC LVMTCB on Monday

## 2021-01-29 NOTE — Telephone Encounter (Signed)
Primary Pulmonologist: Erica [name] Last office visit and with whom: 09/03/2020 Erica [name] What do we see them for (pulmonary problems): IPF and emphysema Last OV assessment/plan:   Assessment:         ICD-10-CM    1. IPF (idiopathic pulmonary fibrosis) ([place])  J84.112    2. Pulmonary emphysema, unspecified emphysema type ([place])  J43.9    3. Encounter for therapeutic drug monitoring  Z51.81    4. Diarrhea due to drug  K52.1           Plan:     Patient Instructions        ICD-10-CM    1. IPF (idiopathic pulmonary fibrosis) ([place])  J84.112    2. Pulmonary emphysema, unspecified emphysema type ([place])  J43.9    3. Encounter for therapeutic drug monitoring  Z51.81    4. Diarrhea due to drug  K52.1        Clinically pulmonary fibrosis is stable.  To bed you had a stroke in January 2022 that is limiting her mobility but glad the stroke was only mild.  There is some amount of diarrhea because of pirfenidone.  However he resolved through shared decision making that he would not change your dose of pirfenidone balancing risk versus side effect versus benefit.   We briefly discussed clinical trials as a care option is something for you to reflect upon   Plan -Check liver function test today for monitoring of the liver in the setting of pirfenidone -Continue pirfenidone at 3 pills 3 times daily with food -Avoid caffeine and sugars to improve your diarrhea -Continue inhaler therapy Spiriva and Symbicort scheduled for your emphysema [take refills] -Continue physical therapy/rehabilitation to improve from any stroke related deficits -Do high-resolution CT chest supine and prone in 3 months -Do spirometry and DLCO in 3 months   Follow-up -Return in 3 months to see Dr. Chase Caller but after spirometry and DLCO and high-resolution CT chest -30-minute slot                 SIGNATURE      Dr. Brand Males, M.D., F.C.C.P,  Pulmonary and Critical Care Medicine Staff Physician, [place] Director - Interstitial Lung Disease  Program  P[facility] [place] at [place], [place], [address]   Pager: [phone], If no answer or between  15:00h - 7:00h: call [phone] Telephone: [phone]   3:52 PM 09/03/2020        Patient Instructions by Brand Males, MD at 09/03/2020 3:00 PM  Author: Brand Males, MD Author Type: Physician Filed: 09/03/2020  6:25 PM  Note Status: Addendum Cosign: Cosign Not Required Encounter Date: 09/03/2020  Editor: Brand Males, MD (Physician)      Prior Versions: 1. Brand Males, MD (Physician) at 09/03/2020  3:50 PM - Addendum   2. Brand Males, MD (Physician) at 09/03/2020  3:47 PM - Addendum   3. Brand Males, MD (Physician) at 09/03/2020  3:47 PM - Signed        ICD-10-CM    1. IPF (idiopathic pulmonary fibrosis) ([place])  J84.112    2. Pulmonary emphysema, unspecified emphysema type ([place])  J43.9    3. Encounter for therapeutic drug monitoring  Z51.81    4. Diarrhea due to drug  K52.1        Clinically pulmonary fibrosis is stable.  To bad you had a stroke in January 2022 that is limiting her mobility but glad the  stroke was only mild.  There is some amount of diarrhea because of pirfenidone.  However he resolved through shared decision making that he would not change your dose of pirfenidone balancing risk versus side effect versus benefit.   We briefly discussed clinical trials as a care option is something for you to reflect upon   Plan -Check liver function test today for monitoring of the liver in the setting of pirfenidone -Continue pirfenidone at 3 pills 3 times daily with food -Avoid caffeine and sugars to improve your diarrhea -Continue inhaler therapy Spiriva and Symbicort scheduled for your emphysema [take refills] -Continue physical therapy/rehabilitation to improve from any stroke related deficits -Do high-resolution CT chest supine and prone  in 3 months -Do spirometry and DLCO in 3 months   Follow-up -Return in 3 months to see Dr. Chase Caller but after spirometry and DLCO and high-resolution CT chest -30-minute slot               Orthostatic Vitals Recorded in This Encounter   09/03/2020  1451     BP Location: Left Arm  Cuff Size: Normal   Instructions      ICD-10-CM    1. IPF (idiopathic pulmonary fibrosis) ([place])  J84.112    2. Pulmonary emphysema, unspecified emphysema type ([place])  J43.9    3. Encounter for therapeutic drug monitoring  Z51.81    4. Diarrhea due to drug  K52.1        Clinically pulmonary fibrosis is stable.  To bad you had a stroke in January 2022 that is limiting her mobility but glad the stroke was only mild.  There is some amount of diarrhea because of pirfenidone.  However he resolved through shared decision making that he would not change your dose of pirfenidone balancing risk versus side effect versus benefit.   We briefly discussed clinical trials as a care option is something for you to reflect upon   Plan -Check liver function test today for monitoring of the liver in the setting of pirfenidone -Continue pirfenidone at 3 pills 3 times daily with food -Avoid caffeine and sugars to improve your diarrhea -Continue inhaler therapy Spiriva and Symbicort scheduled for your emphysema [take refills] -Continue physical therapy/rehabilitation to improve from any stroke related deficits -Do high-resolution CT chest supine and prone in 3 months -Do spirometry and DLCO in 3 months   Follow-up -Return in 3 months to see Dr. Chase Caller but after spirometry and DLCO and high-resolution CT chest -30-minute slot       Reason for call: Pt tested positive for Covid-19 on a home test on 01/25/2021; pt stated that she has a slight runny nose a cough; pt is coughing up clear mucus; denies having a fever and denies wheezing. Pt also had questions in regards to her medication Esbriet stated that she has been  given the generic brand but she believes that it is weaker than the original brand and wanted to see what Dr. Chase Caller thinks.  She received an antiviral from her pcp.  She is improving from having covid.  She is using her Symbicort, albuterol and spiriva as prescribed.  She uses the albuterol mostly when she is going out to the store and does a lot of walking. Her main concern is the Esbriet, she is taking (3) 267 mg tabs three times daily.  She is taking the generic form and does not feel like that is doing much.  She would like Dr. Golden Pop suggestions.  Please advise.  Thank  you.  (examples of things to ask: : When did symptoms start? Fever? Cough? Productive? Color to sputum? More sputum than usual? Wheezing? Have you needed increased oxygen? Are you taking your respiratory medications? What over the counter measures have you tried?)  Allergies  Allergen Reactions   Penicillins Hives, Itching and Swelling    Tongue swells up Has patient had a PCN reaction causing immediate rash, facial/tongue/throat swelling, SOB or lightheadedness with hypotension: Yes Has patient had a PCN reaction causing severe rash involving mucus membranes or skin necrosis: Yes Has patient had a PCN reaction that required hospitalization Yes Has patient had a PCN reaction occurring within the last 10 years: No If all of the above answers are "NO", then may proceed with Cephalosporin use.     Immunization History  Administered Date(s) Administered   Influenza Split 03/13/2017   Influenza Whole 04/26/2018   Influenza, High Dose Seasonal PF 02/29/2020   Influenza,inj,Quad PF,6+ Mos 03/09/2019   Influenza-Unspecified 03/13/2016   PFIZER(Purple Top)SARS-COV-2 Vaccination 08/19/2019, 09/18/2019

## 2021-01-29 NOTE — Telephone Encounter (Signed)
Pt tested positive for Covid-19 on a home test on 01/25/2021; pt stated that she has a slight runny nose a cough; pt is coughing up clear mucus; denies having a fever and denies wheezing. Pt also had questions in regards to her medication Esbriet stated that she has been given the generic brand but she believes that it is weaker than the original brand and wanted to see what Dr. Chase Caller thinks.  Pharmacy; [facility], [place] - [address] AT [address]  [place], [place] [id number]   Pls regard; [phone]

## 2021-01-29 NOTE — Telephone Encounter (Signed)
Glad she is improving from the [place] with the help of antiviral  It is impossible to tell if the generic pirfenidone is inferior to the branded pirfenidone.  This because outcomes data based on 1 year.  Could you please find out why she is feeling that the generic pirfenidone is inferior  ?  Is it because not having that many side effects?  Regardless it will be impossible to change her to branded pirfenidone because of insurance reasons  We can discuss more at a scheduled office visit about continuing with generic pirfenidone versus switching over to nintedanib    Allergies  Allergen Reactions   Penicillins Hives, Itching and Swelling    Tongue swells up Has patient had a PCN reaction causing immediate rash, facial/tongue/throat swelling, SOB or lightheadedness with hypotension: Yes Has patient had a PCN reaction causing severe rash involving mucus membranes or skin necrosis: Yes Has patient had a PCN reaction that required hospitalization Yes Has patient had a PCN reaction occurring within the last 10 years: No If all of the above answers are "NO", then may proceed with Cephalosporin use.

## 2021-02-01 NOTE — Telephone Encounter (Signed)
ATC patient x1.  LVM to return call.

## 2021-02-01 NOTE — Telephone Encounter (Signed)
Patient is returning phone call.  Patient phone number is [phone]. 

## 2021-02-01 NOTE — Telephone Encounter (Signed)
Lm for patient.  

## 2021-02-01 NOTE — Telephone Encounter (Signed)
Multiple attempts to call patient. Closing encounter

## 2021-02-01 NOTE — Telephone Encounter (Signed)
LMTCB for the pt 

## 2021-02-01 NOTE — Telephone Encounter (Signed)
ATC x2.  LVM to return call.

## 2021-02-02 ENCOUNTER — Telehealth: Payer: Self-pay | Admitting: *Deleted

## 2021-02-02 NOTE — Telephone Encounter (Signed)
Patient returned call, provided information per Dr. Chase Caller:  Erica [name] she is improving from the COVID with the help of antiviral  It is impossible to tell if the generic pirfenidone is inferior to the branded pirfenidone.  This because outcomes data based on 1 year.  Could you please find out why she is feeling that the generic pirfenidone is inferior  ?  Is it because not having that many side effects?   Regardless it will be impossible to change her to branded pirfenidone because of insurance reasons   We can discuss more at a scheduled office visit about continuing with generic pirfenidone versus switching over to nintedanib            Allergies  Allergen Reactions   Penicillins Hives, Itching and Swelling      Tongue swells up Has patient had a PCN reaction causing immediate rash, facial/tongue/throat swelling, SOB or lightheadedness with hypotension: Yes Has patient had a PCN reaction causing severe rash involving mucus membranes or skin necrosis: Yes Has patient had a PCN reaction that required hospitalization Yes Has patient had a PCN reaction occurring within the last 10 years: No If all of the above answers are "NO", then may proceed with Cephalosporin use.     Patient states that she has had a cough since early may, prior to have Covid in August.  Prior to May, she felt like she had her condition under control.  She did have a stroke on May 14th.  She feels like the cough is improving since having covid, but still has the cough.  She has sob when she exerts herself.  She does have side effects from the pirfenidone, diarrhea and nausea.  She is scheduled for a PFT on 03/19/21 and an OV.  Do we need to push her PFT out 90 days since she has had Covid?  Please advise.  Thank you.

## 2021-02-08 NOTE — Telephone Encounter (Signed)
COVID patients are not contagious 11-14 days after having COVID.  So of the COVID was in August 2022 she can definitely have a PFTs in October 2022.     If the office protocol says 90 days and if that is the policy then the policy might need to be change but till then we might have to follow the policy because the policy is the policy.  Please clarify with administrator what the policy is but from a clinical standpoint I think it is okay to have PFT in October 2022 if the [place] was in August 2022

## 2021-02-09 NOTE — Telephone Encounter (Signed)
Spoke to [place], who confirmed that our protocol is 90 days, however he does not see an issue with completing PFT in October as that will be roughly 60 day after positive test.   He suggested that I contact [facility] to confirm that PFT policy and protocols. Spoke to [facility], who stated that [facility] PFT 10 days after if patient does not have any sx. Email sent to [facility] for confirmation.

## 2021-02-09 NOTE — Telephone Encounter (Signed)
Left message for Kristopher Oppenheim to confirm office protocol.

## 2021-02-15 NOTE — Addendum Note (Signed)
Addended by: Charlett Blake on: 02/15/2021 12:59 PM   Modules accepted: Orders

## 2021-02-16 ENCOUNTER — Ambulatory Visit: Payer: Medicare HMO | Attending: Physical Medicine & Rehabilitation

## 2021-02-16 ENCOUNTER — Other Ambulatory Visit: Payer: Self-pay

## 2021-02-16 VITALS — BP 128/86

## 2021-02-16 DIAGNOSIS — M6281 Muscle weakness (generalized): Secondary | ICD-10-CM | POA: Diagnosis present

## 2021-02-16 DIAGNOSIS — R2681 Unsteadiness on feet: Secondary | ICD-10-CM | POA: Diagnosis present

## 2021-02-16 DIAGNOSIS — R41840 Attention and concentration deficit: Secondary | ICD-10-CM | POA: Diagnosis present

## 2021-02-16 DIAGNOSIS — R278 Other lack of coordination: Secondary | ICD-10-CM | POA: Diagnosis present

## 2021-02-16 DIAGNOSIS — R2689 Other abnormalities of gait and mobility: Secondary | ICD-10-CM | POA: Diagnosis present

## 2021-02-16 DIAGNOSIS — R41844 Frontal lobe and executive function deficit: Secondary | ICD-10-CM | POA: Diagnosis present

## 2021-02-16 DIAGNOSIS — R208 Other disturbances of skin sensation: Secondary | ICD-10-CM | POA: Diagnosis present

## 2021-02-16 NOTE — Telephone Encounter (Signed)
Have you received an email back on this patient

## 2021-02-17 NOTE — Therapy (Signed)
[facility] [address] [place], [place], [address] Phone: [phone]   Fax:  [phone]  Physical Therapy Evaluation  Patient Details  Name: Erica [name] MRN: [id number] Date of Birth: February 09, 1956 Referring Provider (PT): [name]   Encounter Date: 02/16/2021   PT End of Session - 02/16/21 1317     Visit Number 1    Number of Visits 17    Date for PT Re-Evaluation 04/16/21    Authorization Type [employer] medicare    PT Start Time 1315    PT Stop Time 1356    PT Time Calculation (min) 41 min    Equipment Utilized During Treatment Gait belt    Activity Tolerance Patient tolerated treatment well    Behavior During Therapy [facility] for tasks assessed/performed             Past Medical History:  Diagnosis Date   Arthritis    especially in shoulders   Asthma    Depression    Diabetes mellitus    Embolic stroke ([place]) 1/44/3154   GERD (gastroesophageal reflux disease)    Headache(784.0)    "mild"   Hypertension    Loop Biotronik loop implant 12/16/2020 12/16/2020   Mental disorder    depression   Neuropathy    feet    Pain    arthritis pain - takes tramadol as needed   Rectal polyp    very little bleeding with bowel movements- no pain   Stroke ([place]) 11/03/2020   Suicide attempt [facility])     Past Surgical History:  Procedure Laterality Date   ANAL RECTAL MANOMETRY N/A 07/13/2016   Procedure: ANO RECTAL MANOMETRY;  Surgeon: Leighton Ruff, MD;  Location: [facility];  Service: Endoscopy;  Laterality: N/A;   CESAREAN SECTION     EUS N/A 11/21/2012   Procedure: LOWER ENDOSCOPIC ULTRASOUND (EUS);  Surgeon: Arta Silence, MD;  Location: [facility];  Service: Endoscopy;  Laterality: N/A;   FLEXIBLE SIGMOIDOSCOPY N/A 11/21/2012   Procedure: FLEXIBLE SIGMOIDOSCOPY;  Surgeon: Arta Silence, MD;  Location: [facility];  Service: Endoscopy;  Laterality: N/A;   FLEXIBLE SIGMOIDOSCOPY N/A 01/28/2013   Procedure:  FLEXIBLE SIGMOIDOSCOPY;  Surgeon: Leighton Ruff, MD;  Location: [facility];  Service: Endoscopy;  Laterality: N/A;   LAPAROSCOPIC LOW ANTERIOR RESECTION N/A 01/29/2013   Procedure: LAPAROSCOPIC LOW ANTERIOR RESECTION, Rigid Proctoscopy;  Surgeon: Leighton Ruff, MD;  Location: [facility];  Service: General;  Laterality: N/A;   LAPAROSCOPIC SIGMOID COLECTOMY N/A 11/14/2012   Procedure: DIAGNOSTIC LAPAROSCOPY AND SIGMOIDMOIDOSCOPY ;  Surgeon: Rolm Bookbinder, MD;  Location: [facility];  Service: General;  Laterality: N/A;   RECTAL ULTRASOUND N/A 07/13/2016   Procedure: RECTAL ULTRASOUND;  Surgeon: Leighton Ruff, MD;  Location: [facility];  Service: Endoscopy;  Laterality: N/A;   TONSILLECTOMY     TONSILLECTOMY AND ADENOIDECTOMY      Vitals:   02/16/21 1336  BP: 128/86      Subjective Assessment - 02/16/21 1317     Subjective Pt was referred for history of stroke. Has had 2 this year- 1 in January and 1 in May ( Right parieto-occipital infarct.). Pt uses RW to walk with around house. She sometimes walks on sidewalk outside. Pt reports she has to use walker as scared left leg will give out.    Pertinent History arthritis, asthma, depression, HTN, neuropathy, suicide attempt, DM, CVA    Patient Stated Goals Pt would like to be able to walk and use her hands better.  Currently in Pain? No/denies                [facility] PT Assessment - 02/16/21 1321       Assessment   Medical Diagnosis gait disturbance, post-stroke    Referring Provider (PT) [name]    Onset Date/Surgical Date 01/21/21    Hand Dominance Right    Prior Therapy had home health after both strokes      Precautions   Precautions Fall      Balance Screen   Has the patient fallen in the past 6 months No    Has the patient had a decrease in activity level because of a fear of falling?  Yes    Is the patient reluctant to leave their home because of a fear of falling?  Yes      [place]  Private residence    Living Arrangements Children   grown son is there sometimes   Available Help at Discharge Family   sister checks on her as well   Type of Home Apartment    Home Access Level entry;Stairs to enter   level in front, 4 steps in back   Entrance Stairs-Number of Steps 4    Entrance Stairs-Rails Right;Left;Cannot reach both    Home Layout One level    [place] - 2 wheels;Cane - quad;Bedside commode;Tub bench;Grab bars - tub/shower      Prior Function   Level of Independence Independent;Independent with community mobility without device    Vocation Retired    Leisure listen to music      Cognition   Overall Cognitive Status Impaired/Different from baseline    Area of Impairment --   feels that her thinking is a little slow at times     Observation/Other Assessments   Observations wears glasses, denies any changes to vision. PT did note some difficulty with smooth pursuit tracking. Peripheral vision intact.      Sensation   Light Touch Impaired by gross assessment    Additional Comments Pt has impaired sensation in left fingertips reporting feeling numb/tingly. She was able to feel light touch but feel different.      Coordination   Gross Motor Movements are Fluid and Coordinated Yes    Fine Motor Movements are Fluid and Coordinated No   slowed finger opposition     Posture/Postural Control   Posture Comments rounded shoulders      ROM / Strength   AROM / PROM / Strength Strength      Strength   Strength Assessment Site Shoulder;Elbow;Hand;Hip;Knee;Ankle    Right/Left Shoulder Right;Left    Right Shoulder Flexion 4+/5    Left Shoulder Flexion 2+/5   history of RTC tear. Had just had injection a few days prior to CVA   Right/Left Elbow Right;Left    Right Elbow Flexion 5/5    Right Elbow Extension 5/5    Left Elbow Flexion 4/5    Left Elbow Extension 4/5    Right/Left hand Right;Left    Right Hand Gross Grasp Functional    Left Hand Gross Grasp  Impaired   decreased grip strength compared to right   Right/Left Hip Right;Left    Right Hip Flexion 4+/5    Right Hip ABduction 4+/5    Left Hip Flexion 3+/5    Left Hip ABduction 4/5    Right/Left Knee Right;Left    Right Knee Flexion 5/5    Right Knee Extension 5/5  Left Knee Flexion 4/5    Left Knee Extension 4/5    Right/Left Ankle Right;Left    Right Ankle Dorsiflexion 5/5    Left Ankle Dorsiflexion 4/5      Transfers   Transfers Sit to Stand;Stand to Sit    Sit to Stand 5: Supervision    Five time sit to stand comments  28.59 sec from chair without hands    Stand to Sit 5: Supervision      Ambulation/Gait   Ambulation/Gait Yes    Ambulation/Gait Assistance 5: Supervision;4: Min guard    Ambulation Distance (Feet) 50 Feet    Assistive device Rolling walker    Gait Pattern Step-through pattern;Decreased stance time - left;Decreased step length - right    Ambulation Surface Level;Indoor    Gait velocity 22' in 17.33 sec=0.12ms      Standardized Balance Assessment   Standardized Balance Assessment Berg Balance Test      Berg Balance Test   Sit to Stand Able to stand  independently using hands    Standing Unsupported Able to stand safely 2 minutes    Sitting with Back Unsupported but Feet Supported on Floor or Stool Able to sit safely and securely 2 minutes    Stand to Sit Sits safely with minimal use of hands    Transfers Able to transfer safely, minor use of hands    Standing Unsupported with Eyes Closed Able to stand 10 seconds safely    Standing Unsupported with Feet Together Able to place feet together independently and stand for 1 minute with supervision    From Standing, Reach Forward with Outstretched Arm Can reach forward >12 cm safely (5")    From Standing Position, Pick up Object from Floor Able to pick up shoe, needs supervision    From Standing Position, Turn to Look Behind Over each Shoulder Turn sideways only but maintains balance    Turn 360 Degrees  Needs close supervision or verbal cueing    Standing Unsupported, Alternately Place Feet on Step/Stool Able to complete >2 steps/needs minimal assist    Standing Unsupported, One Foot in Front Needs help to step but can hold 15 seconds    Standing on One Leg Tries to lift leg/unable to hold 3 seconds but remains standing independently    Total Score 38                        Objective measurements completed on examination: See above findings.             Upper Extremity Functional Index Score:  /80   PT Education - 02/16/21 2009     Education Details Results of testing and PT plan of care    Person(s) Educated Patient    Methods Explanation    Comprehension Verbalized understanding              PT Short Term Goals - 02/17/21 02947      PT SHORT TERM GOAL #1   Title Pt will be independent with initial strengthening and balance HEP.    Time 4    Period Weeks    Status New    Target Date 03/17/21      PT SHORT TERM GOAL #2   Title Pt will decrease 5 x sit to stand from 28.59 sec to <20 sec for improved balance and functional mobility.    Baseline 02/16/21 28.59 sec    Time 4    Period Weeks  Status New    Target Date 03/17/21      PT SHORT TERM GOAL #3   Title Pt will increase Berg from 38 to 42/56 for improved balance and decreased falls risk.    Baseline 02/16/21 38/56    Time 4    Period Weeks    Status New    Target Date 03/17/21      PT SHORT TERM GOAL #4   Title Pt will ambulate >500' with RW on level surfaces mod I for improved household and short community distances.    Time 4    Period Weeks    Status New    Target Date 03/17/21               PT Long Term Goals - 02/17/21 0827       PT LONG TERM GOAL #1   Title Pt will be independent with progressive HEP for strengthening and balance to continue gains on own.    Time 8    Period Weeks    Status New    Target Date 04/16/21      PT LONG TERM GOAL #2   Title Pt will  increase gait speed to >0.105ms for improved gait safety.    Baseline 02/16/21 0.367m    Time 8    Period Weeks    Status New    Target Date 04/16/21      PT LONG TERM GOAL #3   Title Pt will increase Berg Balance to >45/56 for improved balance and decreased fall risk.    Baseline 02/16/21 38/56    Time 8    Period Weeks    Status New    Target Date 04/16/21      PT LONG TERM GOAL #4   Title Pt will ambulate >800' on varied surfaces with LRAD mod I for improved short community distances.    Time 8    Period Weeks    Status New    Target Date 04/16/21      PT LONG TERM GOAL #5   Title Pt will ambulate up/down 4 steps with 1 rail mod I to safely enter/exit home.    Time 8    Period Weeks    Status New    Target Date 04/16/21      Additional Long Term Goals   Additional Long Term Goals Yes      PT LONG TERM GOAL #6   Title Pt will increase FOTO from 50 to 60.    Baseline 02/16/21 50    Time 8    Period Weeks    Status New    Target Date 04/16/21                    Plan - 02/16/21 2006     Clinical Impression Statement Pt is [age]/o female who was referred for gait disturbance, post stroke. She has had 2 strokes this year with most recent being in [place]. Pt has left hemiparesis with left hip flexion of 3+/5. Pt is currently walking with a RW with slow gait speed of 0.3833mindicating decreased safety with household ambulation. She is fall risk based on Berg Balance score of 38/56 and 5 x sit to stand of 28.59 sec. Pt will benefit from skilled PT to address strength, balance and functional mobility deficits.    Personal Factors and Comorbidities Comorbidity 3+    Comorbidities arthritis, asthma, depression, HTN, neuropathy, suicide attempt, DM    Examination-Activity  Limitations Locomotion Level;Transfers;Stand;Stairs;Squat    Examination-Participation Restrictions Community Activity;Driving;Yard Work;Cleaning    Stability/Clinical Decision Making Evolving/Moderate  complexity    Clinical Decision Making Moderate    Rehab Potential Good    PT Frequency 2x / week   plus eval   PT Duration 8 weeks    PT Treatment/Interventions ADLs/Self Care Home Management;DME Instruction;Neuromuscular re-education;Manual techniques;Balance training;Therapeutic exercise;Therapeutic activities;Functional mobility training;Stair training;Gait training;Vestibular;Passive range of motion    PT Next Visit Plan Begin LLE strengthening HEP and balance training. Gait training. Will need to make sure scheduled out 8 weeks after OT eval as just did a few weeks to see what OT plan is.             Patient will benefit from skilled therapeutic intervention in order to improve the following deficits and impairments:  Abnormal gait, Decreased balance, Decreased activity tolerance, Decreased mobility, Decreased strength, Decreased knowledge of use of DME, Decreased endurance, Impaired sensation  Visit Diagnosis: Other abnormalities of gait and mobility  Muscle weakness (generalized)  Unsteadiness on feet     Problem List Patient Active Problem List   Diagnosis Date Noted   Encounter for loop recorder check 01/02/2021   Loop Biotronik loop implant 12/16/2020 12/16/2020   Leukopenia    Controlled type 2 diabetes mellitus with hyperglycemia, with long-term current use of insulin (HCC)    Vascular headache    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[place]) 06/30/2020   Acute CVA (cerebrovascular accident) ([place]) 06/26/2020   CVA (cerebral vascular accident) ([place]) 06/25/2020   BMI  32.0-32.9,adult 06/03/2020   Left upper lobe pneumonia 03/30/2020   Chest pain 10/01/2019   MDD (major depressive disorder), recurrent severe, without psychosis ([place]) 05/09/2019   Suicide attempt ([place]) 05/09/2019   Cocaine abuse ([place]) 05/09/2019   Interstitial lung disease ([place]) 06/21/2016   COPD (chronic obstructive pulmonary disease) ([place]) 06/21/2016   Cough 06/21/2016   GERD (gastroesophageal reflux disease) 06/21/2016   Hypersomnia 06/21/2016   Tobacco user 06/21/2016   Rectal cancer ([place]) 01/30/2013    Electa Sniff, PT, DPT, NCS 02/17/2021, 8:36 AM  [facility] [address] [place], [place], [address] Phone: [phone]   Fax:  [phone]  Name: DYAMON SOSINSKI MRN: [id number] Date of Birth: 04-Dec-1955

## 2021-02-17 NOTE — Telephone Encounter (Signed)
This matter will be discussed with leadership today and determination will be made.

## 2021-02-24 ENCOUNTER — Ambulatory Visit: Payer: Medicare HMO | Admitting: Occupational Therapy

## 2021-02-24 ENCOUNTER — Encounter: Payer: Self-pay | Admitting: Occupational Therapy

## 2021-02-24 ENCOUNTER — Other Ambulatory Visit: Payer: Self-pay

## 2021-02-24 ENCOUNTER — Ambulatory Visit: Payer: Medicare HMO

## 2021-02-24 DIAGNOSIS — R2681 Unsteadiness on feet: Secondary | ICD-10-CM

## 2021-02-24 DIAGNOSIS — M6281 Muscle weakness (generalized): Secondary | ICD-10-CM

## 2021-02-24 DIAGNOSIS — R208 Other disturbances of skin sensation: Secondary | ICD-10-CM

## 2021-02-24 DIAGNOSIS — R2689 Other abnormalities of gait and mobility: Secondary | ICD-10-CM

## 2021-02-24 DIAGNOSIS — R41844 Frontal lobe and executive function deficit: Secondary | ICD-10-CM

## 2021-02-24 DIAGNOSIS — R278 Other lack of coordination: Secondary | ICD-10-CM

## 2021-02-24 DIAGNOSIS — R41840 Attention and concentration deficit: Secondary | ICD-10-CM

## 2021-02-24 NOTE — Therapy (Signed)
[facility] [address] [place], [place], [address] Phone: [phone]   Fax:  [phone]  Occupational Therapy Evaluation  Patient Details  Name: Erica [name] MRN: [id number] Date of Birth: 1955/12/13 Referring Provider (OT): Dr. Letta Pate   Encounter Date: 02/24/2021   OT End of Session - 02/24/21 1204     Visit Number 1    Number of Visits 25    Date for OT Re-Evaluation 05/19/21    Authorization Type Medicare/ Medicaid    Authorization - Visit Number 1    Progress Note Due on Visit 10    OT Start Time 1032    OT Stop Time 1100    OT Time Calculation (min) 28 min    Activity Tolerance Patient tolerated treatment well    Behavior During Therapy [facility] for tasks assessed/performed             Past Medical History:  Diagnosis Date   Arthritis    especially in shoulders   Asthma    Depression    Diabetes mellitus    Embolic stroke ([place]) 5/85/2778   GERD (gastroesophageal reflux disease)    Headache(784.0)    "mild"   Hypertension    Loop Biotronik loop implant 12/16/2020 12/16/2020   Mental disorder    depression   Neuropathy    feet    Pain    arthritis pain - takes tramadol as needed   Rectal polyp    very little bleeding with bowel movements- no pain   Stroke ([place]) 11/03/2020   Suicide attempt [facility])     Past Surgical History:  Procedure Laterality Date   ANAL RECTAL MANOMETRY N/A 07/13/2016   Procedure: ANO RECTAL MANOMETRY;  Surgeon: Leighton Ruff, MD;  [facility]: [facility];  Service: Endoscopy;  Laterality: N/A;   CESAREAN SECTION     EUS N/A 11/21/2012   Procedure: LOWER ENDOSCOPIC ULTRASOUND (EUS);  Surgeon: Arta Silence, MD;  Location: [facility];  Service: Endoscopy;  Laterality: N/A;   FLEXIBLE SIGMOIDOSCOPY N/A 11/21/2012   Procedure: FLEXIBLE SIGMOIDOSCOPY;  Surgeon: Arta Silence, MD;  [facility];  Service: Endoscopy;  Laterality: N/A;   FLEXIBLE SIGMOIDOSCOPY N/A  01/28/2013   Procedure: FLEXIBLE SIGMOIDOSCOPY;  Surgeon: Leighton Ruff, MD;  [facility];  Service: Endoscopy;  Laterality: N/A;   LAPAROSCOPIC LOW ANTERIOR RESECTION N/A 01/29/2013   Procedure: LAPAROSCOPIC LOW ANTERIOR RESECTION, Rigid Proctoscopy;  Surgeon: Leighton Ruff, MD;  [facility]: [facility];  Service: General;  Laterality: N/A;   LAPAROSCOPIC SIGMOID COLECTOMY N/A 11/14/2012   Procedure: DIAGNOSTIC LAPAROSCOPY AND SIGMOIDMOIDOSCOPY ;  Surgeon: Rolm Bookbinder, MD;  [facility];  Service: General;  Laterality: N/A;   RECTAL ULTRASOUND N/A 07/13/2016   Procedure: RECTAL ULTRASOUND;  Surgeon: Leighton Ruff, MD;  [facility];  Service: Endoscopy;  Laterality: N/A;   TONSILLECTOMY     TONSILLECTOMY AND ADENOIDECTOMY      There were no vitals filed for this visit.   Subjective Assessment - 02/24/21 1035     Subjective  Denies pain    Pertinent History Referred by [name] 01/21/21 , post-stroke  History of right parieto-occipital infarct. Most recent stroke 11/03/20. PMH: arthritis, asthma, depression, HTN, neuropathy, suicide attempt, DM    Patient Stated Goals do more stuff around the house    Currently in Pain? No/denies               [facility] OT Assessment - 02/24/21 1038       Assessment  Medical Diagnosis CVA    Referring Provider (OT) Dr. Letta Pate    Onset Date/Surgical Date 01/21/21    Hand Dominance Right    Prior Therapy had home health after both strokes      Precautions   Precautions Fall      Prior Function   Level of Independence Independent;Independent with community mobility without device    Vocation Retired    Leisure listen to music      ADL   Eating/Feeding Modified independent    Grooming Modified independent    Upper Body Bathing Supervision/safety    Lower Body Bathing Supervision/safety   uses tub bench   Upper Body Dressing Supervision/safety    Lower Body Dressing Supervision/safety    Toilet Transfer  Supervision/safety   uses walker   Tub/Shower Transfer Supervision/safety      IADL   Shopping Completely unable to shop    Light Housekeeping Performs light daily tasks but cannot maintain acceptable level of cleanliness    Meal Prep Able to complete simple cold meal and snack prep   has cooked with supervision using air fryer   Medication Management Takes responsibility if medication is prepared in advance in seperate dosage    Financial Management Dependent   son is currently handling     Mobility   Mobility Status --   supervision with walkerc     Written Expression   Dominant Hand Right    Handwriting 100% legible      Vision - History   Additional Comments Deneis changes since CVA      Vision Assessment   Vision Assessment Vision not tested      Cognition   Overall Cognitive Status Impaired/Different from baseline    Area of Impairment Memory    Attention Selective    Selective Attention Impaired    Memory Impaired    Awareness Appears intact    Cognition Comments Pt reports slower processing      Sensation   Light Touch Impaired by gross assessment   LUE   Additional Comments Pt has impaired sensation in left fingertips reporting feeling numb/tingly. She was able to feel light touch but feel different.      Coordination   Fine Motor Movements are Fluid and Coordinated No    9 Hole Peg Test Right;Left    Right 9 Hole Peg Test 25.50    Left 9 Hole Peg Test 31.0    Box and Blocks R 46 L 47      ROM / Strength   AROM / PROM / Strength AROM      AROM   Overall AROM  Deficits    Overall AROM Comments RUE grossly WFLS, LUE shoulder flexion 105*, shoulder abduction 95*, remaining ROM grossly WFLS, pt shoulder ROM is limited by weakness      Hand Function   Right Hand Grip (lbs) 37.9    Left Hand Grip (lbs) 34.8                                OT Short Term Goals - 02/24/21 1058       OT SHORT TERM GOAL #1   Title I with inital  HEP-  03/24/21    Time 4    Period Weeks    Status New    Target Date 03/24/21      OT SHORT TERM GOAL #2   Title I with sensory precautions  Time 4    Period Weeks    Status New      OT SHORT TERM GOAL #3   Title Pt will perform basic home mangement with supervision    Time 4    Period Weeks    Status New      OT SHORT TERM GOAL #4   Title Pt will perform basic cooking with supervision demonstrating good safety awareness.    Time 4    Period Weeks    Status New      OT SHORT TERM GOAL #5   Title Pt will perform all basic ADLS modified independently.    Time 4    Period Weeks    Status New      Additional Short Term Goals   Additional Short Term Goals Yes      OT SHORT TERM GOAL #6   Title Pt will retrieve a lightweight object at 115 shoulder flexion with LUE without dropping    Baseline 105*    Time 4    Period Weeks    Status New               OT Long Term Goals - 02/24/21 1158       OT LONG TERM GOAL #1   Title I with updated HEP. 05/19/21    Time 12    Period Weeks    Status New    Target Date 05/19/21      OT LONG TERM GOAL #2   Title Pt will demonstrate improved fine motor coordination for ADLs as evidenced  by decreasing LUE 9 hole peg test to 28 secs without drops.    Time 12    Period Weeks    Status New      OT LONG TERM GOAL #3   Title Pt will demonstrate ability to retrieve a 2 lbs object from overhead shelf without drops with LUE.    Time 12    Period Weeks    Status New      OT LONG TERM GOAL #4   Title Pt will perform all basic ADLs modified Indpendently.    Time 12    Period Weeks    Status New      OT LONG TERM GOAL #5   Title Pt will perfom basic home management/ cooking mod I with good safety awareness.    Time 12    Period Weeks    Status New      Long Term Additional Goals   Additional Long Term Goals Yes      OT LONG TERM GOAL #6   Title I with memory compensation strategies.    Time 12    Period Weeks    Status  New                   Plan - 02/24/21 1150     Clinical Impression Statement Pt is a 65 y.o female referred by [name] 01/21/21 , post-stroke  History of right parieto-occipital infarct. Most recent stroke 11/03/20. PMH: arthritis, asthma, depression, HTN, neuropathy, suicide attempt, DM. Pt presents to occupational therapy with the following deficits : decreased strength, decreaed coordination, decreased balance, sensory deficits, mild cognitve deficits which impedes perfromance of ADLs?IADLs . Pt can benefit from skilled occupational therapy to address these deficits inorderto maximize pt's safety and I with daily activities.    OT Occupational Profile and History Detailed Assessment- Review of Records and additional review of physical, cognitive, psychosocial history  related to current functional performance    Occupational performance deficits (Please refer to evaluation for details): ADL's;IADL's;Leisure    Body Structure / Function / Physical Skills ADL;Endurance;UE functional use;Balance;Flexibility;FMC;Gait;ROM;GMC;Coordination;Decreased knowledge of precautions;Sensation;IADL;Decreased knowledge of use of DME;Dexterity;Mobility    Cognitive Skills Attention;Memory    Rehab Potential Good    Clinical Decision Making Limited treatment options, no task modification necessary    Comorbidities Affecting Occupational Performance: May have comorbidities impacting occupational performance    Modification or Assistance to Complete Evaluation  No modification of tasks or assist necessary to complete eval    OT Frequency 2x / week   plus eval, will likely d/c after 8 weeks dependent on progress   OT Duration 12 weeks    OT Treatment/Interventions Self-care/ADL training;Ultrasound;Patient/family education;DME and/or AE instruction;Gait Training;Passive range of motion;Balance training;Fluidtherapy;Cryotherapy;Electrical Stimulation;Therapist, nutritional;Therapeutic  activities;Manual Therapy;Therapeutic exercise;Cognitive remediation/compensation;Neuromuscular education    Plan initate HEP for LUE coordination, education rearding sensory precautions, consider supine cane for LUE ROM and control    Consulted and Agree with Plan of Care Patient             Patient will benefit from skilled therapeutic intervention in order to improve the following deficits and impairments:   Body Structure / Function / Physical Skills: ADL, Endurance, UE functional use, Balance, Flexibility, FMC, Gait, ROM, GMC, Coordination, Decreased knowledge of precautions, Sensation, IADL, Decreased knowledge of use of DME, Dexterity, Mobility Cognitive Skills: Attention, Memory     Visit Diagnosis: Muscle weakness (generalized) - Plan: Ot plan of care cert/re-cert  Unsteadiness on feet - Plan: Ot plan of care cert/re-cert  Other abnormalities of gait and mobility - Plan: Ot plan of care cert/re-cert  Other lack of coordination - Plan: Ot plan of care cert/re-cert  Frontal lobe and executive function deficit - Plan: Ot plan of care cert/re-cert  Attention and concentration deficit - Plan: Ot plan of care cert/re-cert  Other disturbances of skin sensation - Plan: Ot plan of care cert/re-cert    Problem List Patient Active Problem List   Diagnosis Date Noted   Encounter for loop recorder check 01/02/2021   Loop Biotronik loop implant 12/16/2020 12/16/2020   Leukopenia    Controlled type 2 diabetes mellitus with hyperglycemia, with long-term current use of insulin (HCC)    Vascular headache    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[place]) 56/97/9480   Embolic stroke ([place]) 16/55/3748   Sinus bradycardia on ECG 11/03/2020   Benign essential HTN    Pancytopenia (HCC)    Stage 3b chronic kidney disease ([place])    Anemia    Labile blood glucose    Labile blood pressure    Diabetic peripheral neuropathy (HCC)    Essential hypertension    AKI (acute kidney injury) ([place])    Ataxia,  late effect of cerebrovascular disease    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[place]) 06/30/2020   Acute CVA (cerebrovascular accident) ([place]) 06/26/2020   CVA (cerebral vascular accident) ([place]) 06/25/2020   BMI 32.0-32.9,adult 06/03/2020   Left upper lobe pneumonia 03/30/2020   Chest pain 10/01/2019   MDD (major depressive disorder), recurrent severe, 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 [place], [place], [address] Phone: [phone]   Fax:  [phone]  Name: VENESSA WICKHAM MRN: [id number] Date of Birth: 01/02/1956

## 2021-02-24 NOTE — Therapy (Signed)
[facility] [address] [place], [place], [address] Phone: [phone]   Fax:  [phone]  Physical Therapy Treatment  Patient Details  Name: Erica [name] MRN: [id number] Date of Birth: [birth year] Referring Provider (PT): [name]   Encounter Date: 02/24/2021   PT End of Session - 02/24/21 1153     Visit Number 2    Number of Visits 17    Date for PT Re-Evaluation 04/16/21    Authorization Type [employer] medicare    PT Start Time 1150    PT Stop Time 1228    PT Time Calculation (min) 38 min    Equipment Utilized During Treatment Gait belt    Activity Tolerance Patient tolerated treatment well    Behavior During Therapy [facility] for tasks assessed/performed             Past Medical History:  Diagnosis Date   Arthritis    especially in shoulders   Asthma    Depression    Diabetes mellitus    Embolic stroke ([place]) 9/37/3428   GERD (gastroesophageal reflux disease)    Headache(784.0)    "mild"   Hypertension    Loop Biotronik loop implant 12/16/2020 12/16/2020   Mental disorder    depression   Neuropathy    feet    Pain    arthritis pain - takes tramadol as needed   Rectal polyp    very little bleeding with bowel movements- no pain   Stroke ([place]) 11/03/2020   Suicide attempt [facility])     Past Surgical History:  Procedure Laterality Date   ANAL RECTAL MANOMETRY N/A 07/13/2016   Procedure: ANO RECTAL MANOMETRY;  Surgeon: Leighton Ruff, MD;  [facility];  Service: Endoscopy;  Laterality: N/A;   CESAREAN SECTION     EUS N/A 11/21/2012   Procedure: LOWER ENDOSCOPIC ULTRASOUND (EUS);  Surgeon: Arta Silence, MD;  Location: [facility];  Service: Endoscopy;  Laterality: N/A;   FLEXIBLE SIGMOIDOSCOPY N/A 11/21/2012   Procedure: FLEXIBLE SIGMOIDOSCOPY;  Surgeon: Arta Silence, MD;  [facility];  Service: Endoscopy;  Laterality: N/A;   FLEXIBLE SIGMOIDOSCOPY N/A 01/28/2013   Procedure:  FLEXIBLE SIGMOIDOSCOPY;  Surgeon: Leighton Ruff, MD;  [facility];  Service: Endoscopy;  Laterality: N/A;   LAPAROSCOPIC LOW ANTERIOR RESECTION N/A 01/29/2013   Procedure: LAPAROSCOPIC LOW ANTERIOR RESECTION, Rigid Proctoscopy;  Surgeon: Leighton Ruff, MD;  [facility];  Service: General;  Laterality: N/A;   LAPAROSCOPIC SIGMOID COLECTOMY N/A 11/14/2012   Procedure: DIAGNOSTIC LAPAROSCOPY AND SIGMOIDMOIDOSCOPY ;  Surgeon: Rolm Bookbinder, MD;  [facility];  Service: General;  Laterality: N/A;   RECTAL ULTRASOUND N/A 07/13/2016   Procedure: RECTAL ULTRASOUND;  Surgeon: Leighton Ruff, MD;  [facility];  Service: Endoscopy;  Laterality: N/A;   TONSILLECTOMY     TONSILLECTOMY AND ADENOIDECTOMY      There were no vitals filed for this visit.   Subjective Assessment - 02/24/21 1153     Subjective Pt denies any new issues.    Pertinent History arthritis, asthma, depression, HTN, neuropathy, suicide attempt, DM, CVA    Patient Stated Goals Pt would like to be able to walk and use her hands better.    Currently in Pain? No/denies                               [facility] Adult PT Treatment/Exercise - 02/24/21 1154  Ambulation/Gait   Ambulation/Gait Yes    Ambulation/Gait Assistance 5: Supervision    Ambulation/Gait Assistance Details around in clinic between activities    Assistive device Rolling walker    Gait Pattern Step-through pattern;Decreased stance time - left;Decreased step length - right    Ambulation Surface Level;Indoor      Neuro Re-ed    Neuro Re-ed Details  Sit to stand from chair without hands x 5 then x 5 with 2" step under RLE to increase left weight shift. At counter: side stepping with light UE support 8' x 4 with verbal cues to keep feet straight and try to pick up left foot more as well as squeeze gluts to help with standing. Walking backward and forwards with RUE support on counter 8' x 2 with tactile cues at left knee  as some instability noted with some hyperextension especially posterior.      Exercises   Exercises Other Exercises    Other Exercises  Bridges x 10, bridges with hip abduction at top x 5 with verbal cues for form, left clamshell x 10 with cues for form. BP=158/86 after                     PT Education - 02/24/21 2022     Education Details Initial HEP    Person(s) Educated Patient    Methods Explanation;Demonstration;Handout    Comprehension Verbalized understanding              PT Short Term Goals - 02/17/21 0823       PT SHORT TERM GOAL #1   Title Pt will be independent with initial strengthening and balance HEP.    Time 4    Period Weeks    Status New    Target Date 03/17/21      PT SHORT TERM GOAL #2   Title Pt will decrease 5 x sit to stand from 28.59 sec to <20 sec for improved balance and functional mobility.    Baseline 02/16/21 28.59 sec    Time 4    Period Weeks    Status New    Target Date 03/17/21      PT SHORT TERM GOAL #3   Title Pt will increase Berg from 38 to 42/56 for improved balance and decreased falls risk.    Baseline 02/16/21 38/56    Time 4    Period Weeks    Status New    Target Date 03/17/21      PT SHORT TERM GOAL #4   Title Pt will ambulate >500' with RW on level surfaces mod I for improved household and short community distances.    Time 4    Period Weeks    Status New    Target Date 03/17/21               PT Long Term Goals - 02/17/21 0827       PT LONG TERM GOAL #1   Title Pt will be independent with progressive HEP for strengthening and balance to continue gains on own.    Time 8    Period Weeks    Status New    Target Date 04/16/21      PT LONG TERM GOAL #2   Title Pt will increase gait speed to >0.29ms for improved gait safety.    Baseline 02/16/21 0.330m    Time 8    Period Weeks    Status New    Target Date 04/16/21  PT LONG TERM GOAL #3   Title Pt will increase Berg Balance to >45/56 for  improved balance and decreased fall risk.    Baseline 02/16/21 38/56    Time 8    Period Weeks    Status New    Target Date 04/16/21      PT LONG TERM GOAL #4   Title Pt will ambulate >800' on varied surfaces with LRAD mod I for improved short community distances.    Time 8    Period Weeks    Status New    Target Date 04/16/21      PT LONG TERM GOAL #5   Title Pt will ambulate up/down 4 steps with 1 rail mod I to safely enter/exit home.    Time 8    Period Weeks    Status New    Target Date 04/16/21      Additional Long Term Goals   Additional Long Term Goals Yes      PT LONG TERM GOAL #6   Title Pt will increase FOTO from 50 to 60.    Baseline 02/16/21 50    Time 8    Period Weeks    Status New    Target Date 04/16/21                   Plan - 02/24/21 2023     Clinical Impression Statement PT focused on establishing initial HEP. Pt did not trust left leg when UE support was decreased with activities.    Personal Factors and Comorbidities Comorbidity 3+    Comorbidities arthritis, asthma, depression, HTN, neuropathy, suicide attempt, DM    Examination-Activity Limitations Locomotion Level;Transfers;Stand;Stairs;Squat    Examination-Participation Restrictions Community Activity;Driving;Yard Work;Cleaning    Stability/Clinical Decision Making Evolving/Moderate complexity    Rehab Potential Good    PT Frequency 2x / week   plus eval   PT Duration 8 weeks    PT Treatment/Interventions ADLs/Self Care Home Management;DME Instruction;Neuromuscular re-education;Manual techniques;Balance training;Therapeutic exercise;Therapeutic activities;Functional mobility training;Stair training;Gait training;Vestibular;Passive range of motion    PT Next Visit Plan Continue LLE strengthening and balance training. Gait training with walker and along counter/ parallel bars trying to work on decreasing UE support. Step-ups with LLE working on control.             Patient will  benefit from skilled therapeutic intervention in order to improve the following deficits and impairments:  Abnormal gait, Decreased balance, Decreased activity tolerance, Decreased mobility, Decreased strength, Decreased knowledge of use of DME, Decreased endurance, Impaired sensation  Visit Diagnosis: Other abnormalities of gait and mobility  Muscle weakness (generalized)     Problem List Patient Active Problem List   Diagnosis Date Noted   Encounter for loop recorder check 01/02/2021   Loop Biotronik loop implant 12/16/2020 12/16/2020   Leukopenia    Controlled type 2 diabetes mellitus with hyperglycemia, with long-term current use of insulin (HCC)    Vascular headache    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[place]) 99/24/1551   Embolic stroke ([place]) 61/44/3246   Sinus bradycardia on ECG 11/03/2020   Benign essential HTN    Pancytopenia (HCC)    Stage 3b chronic kidney disease ([place])    Anemia    Labile blood glucose    Labile blood pressure    Diabetic peripheral neuropathy (HCC)    Essential hypertension    AKI (acute kidney injury) ([place])    Ataxia, late effect of cerebrovascular disease    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[place]) 06/30/2020  Acute CVA (cerebrovascular accident) ([place]) 06/26/2020   CVA (cerebral vascular accident) ([place]) 06/25/2020   BMI 32.0-32.9,adult 06/03/2020   Left upper lobe pneumonia 03/30/2020   Chest pain 10/01/2019   MDD (major depressive disorder), recurrent severe, without psychosis ([place]) 05/09/2019   Suicide attempt ([place]) 05/09/2019   Cocaine abuse ([place]) 05/09/2019   Interstitial lung disease ([place]) 06/21/2016   COPD (chronic obstructive pulmonary disease) ([place]) 06/21/2016   Cough 06/21/2016   GERD (gastroesophageal reflux disease) 06/21/2016   Hypersomnia 06/21/2016   Tobacco user 06/21/2016   Rectal cancer ([place]) 01/30/2013    Electa Sniff, PT, DPT, NCS 02/24/2021, 8:25 PM  [facility] [address]  [place], [place], [address] Phone: [phone]   Fax:  [phone]  Name: Erica [name] MRN: [id number] Date of Birth: 02-Oct-1955

## 2021-02-24 NOTE — Patient Instructions (Signed)
Access Code: [email or web address] URL: [email or web address] Date: 02/24/2021 Prepared by: Cherly Anderson  Exercises Bridge with hip abduction - 1 x daily - 5 x weekly - 2 sets - 10 reps Clamshell - 1 x daily - 5 x weekly - 2 sets - 10 reps Sit to Stand - 1 x daily - 7 x weekly - 2 sets - 5 reps Side Stepping with Counter Support - 1 x daily - 5 x weekly - 1 sets - 3-4 reps

## 2021-02-25 ENCOUNTER — Ambulatory Visit: Payer: Medicare HMO

## 2021-02-25 ENCOUNTER — Ambulatory Visit: Payer: Medicare HMO | Admitting: Occupational Therapy

## 2021-02-25 DIAGNOSIS — R278 Other lack of coordination: Secondary | ICD-10-CM

## 2021-02-25 DIAGNOSIS — R41844 Frontal lobe and executive function deficit: Secondary | ICD-10-CM

## 2021-02-25 DIAGNOSIS — R2689 Other abnormalities of gait and mobility: Secondary | ICD-10-CM

## 2021-02-25 DIAGNOSIS — M6281 Muscle weakness (generalized): Secondary | ICD-10-CM

## 2021-02-25 DIAGNOSIS — R41840 Attention and concentration deficit: Secondary | ICD-10-CM

## 2021-02-25 DIAGNOSIS — R208 Other disturbances of skin sensation: Secondary | ICD-10-CM

## 2021-02-25 NOTE — Patient Instructions (Addendum)
Facts You Should Know Sensation and Loss of Feeling  As a result of your condition, you may notice numbness or a loss of feeling in your left arm, leg and/or side of your face.  Loss of feeling can be a serious problem.  Areas on your body without feeling can be easily burned, cut or injured while you are doing everyday activities.  You need to know how to protect these areas.  You may not be able to feel certain objects, textures, movements and/or temperatures.  It has been identified that you have trouble feeling: Sharp objects, such as knives, scissors, or needles, Hot Substances, such as coffee, cigarettes, or bath water, Cold substances such as ice or snow, and Objects in your hand  Below are recommendations for when you go home.  These things will help to protect your body parts that have a loss of feeling and will help you prevent injury to yourself.  When using scissors, knives, needles, etc. without help, use caution so that you do not cut yourself., Stay away from heat sources like the ove,m kerosene heaters and fireplaces., It is better if you do not smoke.  If you do smoke, make sure someone is around to help you use your lighter and to monitor that your ashes are extinguished., Keep affected arm/leg away from stove or other hot objects, Be careful with hot drinks.  Test the temperature with your unaffected hand before taking a sip., Use a liquid thermometer to test the temperature of your hot drinks, bath water, dishwashing water, etc., Before getting into the bathtub, washing your hands or washing dishes, check the temparture of the water with your unaffected hand,, In the winter, wear gloves when outdoors for extended periods of time, Check yourself daily for cuts, bumps and bruises and contact your doctor if any of these concern you., and Always look to see where your arm/hand is.  Be careful not to get it caught in the wheelchair spokes, doors/drawers etc., or let it hang without support.   Keep your arm supported in the wheelchair, on an arm rest, on a pillow or when you are in bed.  The best way to protect the areas without feeling is to look to see where your body parts are and what is touching them.                  ELBOW: Extension / Chest Press use paper towel roll    Lie on back with knees bent. Straighten elbows to raise frame. Hold __3-5_ seconds.  10_ reps per set, _2__ sets per day, __7_ days per week Use steering wheel or hula hoop instead of frame.  Copyright  VHI. All rights reserved.        Shoulder: Flexion (Supine)    With hands shoulder width apart, raise paper towel roll up to shoulder height then lower slowly Do not let elbows bend. Keep back flat. Hold __5__ seconds. Repeat __5__ times. Rest then do another 5 reps Do ___2_ sessions per day.        Coordination Activities  Perform the following activities for 20 minutes 1 times per day with left hand(s).  Rotate ball in fingertips (clockwise and counter-clockwise). Toss ball between hands. Flip cards 1 at a time as fast as you can. Deal cards with your thumb (Hold deck in hand and push card off top with thumb). Pick up coins and stack. Pick up coins one at a time until you get 5-10 in your  hand, then move coins from palm to place in container

## 2021-02-25 NOTE — Therapy (Signed)
[facility] [address] [place], [place], [address] Phone: [phone]   Fax:  [phone]  Occupational Therapy Treatment  Patient Details  Name: Erica [name] MRN: [id number] Date of Birth: 02-Sep-1955 Referring Provider (OT): Dr. Letta Pate   Encounter Date: 02/25/2021   OT End of Session - 02/25/21 1203     Visit Number 2    Number of Visits 25    Date for OT Re-Evaluation 05/19/21    Authorization Type Medicare/ Medicaid    Authorization - Visit Number 2    Progress Note Due on Visit 10    OT Start Time 6222    OT Stop Time 1228    OT Time Calculation (min) 33 min             Past Medical History:  Diagnosis Date   Arthritis    especially in shoulders   Asthma    Depression    Diabetes mellitus    Embolic stroke ([place]) 9/79/8921   GERD (gastroesophageal reflux disease)    Headache(784.0)    "mild"   Hypertension    Loop Biotronik loop implant 12/16/2020 12/16/2020   Mental disorder    depression   Neuropathy    feet    Pain    arthritis pain - takes tramadol as needed   Rectal polyp    very little bleeding with bowel movements- no pain   Stroke ([place]) 11/03/2020   Suicide attempt [facility])     Past Surgical History:  Procedure Laterality Date   ANAL RECTAL MANOMETRY N/A 07/13/2016   Procedure: ANO RECTAL MANOMETRY;  Surgeon: Leighton Ruff, MD;  [facility]: [facility];  Service: Endoscopy;  Laterality: N/A;   CESAREAN SECTION     EUS N/A 11/21/2012   Procedure: LOWER ENDOSCOPIC ULTRASOUND (EUS);  Surgeon: Arta Silence, MD;  Location: [facility];  Service: Endoscopy;  Laterality: N/A;   FLEXIBLE SIGMOIDOSCOPY N/A 11/21/2012   Procedure: FLEXIBLE SIGMOIDOSCOPY;  Surgeon: Arta Silence, MD;  [facility];  Service: Endoscopy;  Laterality: N/A;   FLEXIBLE SIGMOIDOSCOPY N/A 01/28/2013   Procedure: FLEXIBLE SIGMOIDOSCOPY;  Surgeon: Leighton Ruff, MD;  [facility];  Service:  Endoscopy;  Laterality: N/A;   LAPAROSCOPIC LOW ANTERIOR RESECTION N/A 01/29/2013   Procedure: LAPAROSCOPIC LOW ANTERIOR RESECTION, Rigid Proctoscopy;  Surgeon: Leighton Ruff, MD;  [facility]: [facility];  Service: General;  Laterality: N/A;   LAPAROSCOPIC SIGMOID COLECTOMY N/A 11/14/2012   Procedure: DIAGNOSTIC LAPAROSCOPY AND SIGMOIDMOIDOSCOPY ;  Surgeon: Rolm Bookbinder, MD;  [facility];  Service: General;  Laterality: N/A;   RECTAL ULTRASOUND N/A 07/13/2016   Procedure: RECTAL ULTRASOUND;  Surgeon: Leighton Ruff, MD;  [facility];  Service: Endoscopy;  Laterality: N/A;   TONSILLECTOMY     TONSILLECTOMY AND ADENOIDECTOMY      There were no vitals filed for this visit.   Subjective Assessment - 02/25/21 1159     Subjective  Pt reports mild back pain    Pertinent History Referred by [name] 01/21/21 , post-stroke  History of right parieto-occipital infarct. Most recent stroke 11/03/20. PMH: arthritis, asthma, depression, HTN, neuropathy, suicide attempt, DM    Patient Stated Goals do more stuff around the house    Currently in Pain? Yes    Pain Score 2     Pain Location Back    Pain Orientation Right    Pain Descriptors / Indicators Aching    Pain Type Chronic pain    Pain Onset More than a month  ago    Pain Frequency Intermittent    Aggravating Factors  walking    Pain Relieving Factors rest                                  OT Treatment/ Education - 02/25/21 1253     Education Details education provided regarding: sensory loss, and coordination  and supine shoulder  HEP- see pt instructions, min v.c and demonstration   Person(s) Educated Patient    Methods Explanation;Demonstration;Verbal cues;Handout    Comprehension Verbalized understanding;Returned demonstration;Verbal cues required              OT Short Term Goals - 02/24/21 1058       OT SHORT TERM GOAL #1   Title I with inital  HEP- 03/24/21    Time 4    Period Weeks     Status New    Target Date 03/24/21      OT SHORT TERM GOAL #2   Title I with sensory precautions    Time 4    Period Weeks    Status New      OT SHORT TERM GOAL #3   Title Pt will perform basic home mangement with supervision    Time 4    Period Weeks    Status New      OT SHORT TERM GOAL #4   Title Pt will perform basic cooking with supervision demonstrating good safety awareness.    Time 4    Period Weeks    Status New      OT SHORT TERM GOAL #5   Title Pt will perform all basic ADLS modified independently.    Time 4    Period Weeks    Status New      Additional Short Term Goals   Additional Short Term Goals Yes      OT SHORT TERM GOAL #6   Title Pt will retrieve a lightweight object at 115 shoulder flexion with LUE without dropping    Baseline 105*    Time 4    Period Weeks    Status New               OT Long Term Goals - 02/24/21 1158       OT LONG TERM GOAL #1   Title I with updated HEP. 05/19/21    Time 12    Period Weeks    Status New    Target Date 05/19/21      OT LONG TERM GOAL #2   Title Pt will demonstrate improved fine motor coordination for ADLs as evidenced  by decreasing LUE 9 hole peg test to 28 secs without drops.    Time 12    Period Weeks    Status New      OT LONG TERM GOAL #3   Title Pt will demonstrate ability to retrieve a 2 lbs object from overhead shelf without drops with LUE.    Time 12    Period Weeks    Status New      OT LONG TERM GOAL #4   Title Pt will perform all basic ADLs modified Indpendently.    Time 12    Period Weeks    Status New      OT LONG TERM GOAL #5   Title Pt will perfom basic home management/ cooking mod I with good safety awareness.    Time 12  Period Weeks    Status New      Long Term Additional Goals   Additional Long Term Goals Yes      OT LONG TERM GOAL #6   Title I with memory compensation strategies.    Time 12    Period Weeks    Status New                    Plan - 02/25/21 1207     Clinical Impression Statement Pt is progressing towards goals. She demonstrates understanding of inital coordination HEP.    OT Occupational Profile and History Detailed Assessment- Review of Records and additional review of physical, cognitive, psychosocial history related to current functional performance    Occupational performance deficits (Please refer to evaluation for details): ADL's;IADL's;Leisure    Body Structure / Function / Physical Skills ADL;Endurance;UE functional use;Balance;Flexibility;FMC;Gait;ROM;GMC;Coordination;Decreased knowledge of precautions;Sensation;IADL;Decreased knowledge of use of DME;Dexterity;Mobility    Cognitive Skills Attention;Memory    Rehab Potential Good    Clinical Decision Making Limited treatment options, no task modification necessary    Comorbidities Affecting Occupational Performance: May have comorbidities impacting occupational performance    Modification or Assistance to Complete Evaluation  No modification of tasks or assist necessary to complete eval    OT Frequency 2x / week   plus eval, will likely d/c after 8 weeks dependent on progress   OT Duration 12 weeks    OT Treatment/Interventions Self-care/ADL training;Ultrasound;Patient/family education;DME and/or AE instruction;Gait Training;Passive range of motion;Balance training;Fluidtherapy;Cryotherapy;Electrical Stimulation;Therapist, nutritional;Therapeutic activities;Manual Therapy;Therapeutic exercise;Cognitive remediation/compensation;Neuromuscular education    Plan Review supine exercises with foam roll, coordination, simple functional mid rrange reaching/ home management   Consulted and Agree with Plan of Care Patient             Patient will benefit from skilled therapeutic intervention in order to improve the following deficits and impairments:   Body Structure / Function / Physical Skills: ADL, Endurance, UE functional use, Balance, Flexibility, FMC,  Gait, ROM, GMC, Coordination, Decreased knowledge of precautions, Sensation, IADL, Decreased knowledge of use of DME, Dexterity, Mobility Cognitive Skills: Attention, Memory     Visit Diagnosis: Muscle weakness (generalized)  Other lack of coordination  Frontal lobe and executive function deficit  Attention and concentration deficit  Other disturbances of skin sensation  Other abnormalities of gait and mobility    Problem List Patient Active Problem List   Diagnosis Date Noted   Encounter for loop recorder check 01/02/2021   Loop Biotronik loop implant 12/16/2020 12/16/2020   Leukopenia    Controlled type 2 diabetes mellitus with hyperglycemia, with long-term current use of insulin (HCC)    Vascular headache    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[place]) 70/26/3785   Embolic stroke ([place]) 88/50/2774   Sinus bradycardia on ECG 11/03/2020   Benign essential HTN    Pancytopenia (HCC)    Stage 3b chronic kidney disease ([place])    Anemia    Labile blood glucose    Labile blood pressure    Diabetic peripheral neuropathy (HCC)    Essential hypertension    AKI (acute kidney injury) ([place])    Ataxia, late effect of cerebrovascular disease    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[place]) 06/30/2020   Acute CVA (cerebrovascular accident) ([place]) 06/26/2020   CVA (cerebral vascular accident) ([place]) 06/25/2020   BMI 32.0-32.9,adult 06/03/2020   Left upper lobe pneumonia 03/30/2020   Chest pain 10/01/2019   MDD (major depressive disorder), recurrent severe, without psychosis ([place]) 05/09/2019   Suicide attempt ([place])  05/09/2019   Cocaine abuse ([place]) 05/09/2019   Interstitial lung disease ([place]) 06/21/2016   COPD (chronic obstructive pulmonary disease) ([place]) 06/21/2016   Cough 06/21/2016   GERD (gastroesophageal reflux disease) 06/21/2016   Hypersomnia 06/21/2016   Tobacco user 06/21/2016   Rectal cancer ([place]) 01/30/2013    Erica [name], OT/L 02/25/2021, 12:53 PM  [facility] [address] [place], [place], [address] Phone: [phone]   Fax:  [phone]  Name: Erica [name] MRN: [id number] Date of Birth: March 29, 1956

## 2021-03-03 ENCOUNTER — Encounter: Payer: Self-pay | Admitting: Internal Medicine

## 2021-03-03 ENCOUNTER — Ambulatory Visit: Payer: Medicare HMO | Admitting: Occupational Therapy

## 2021-03-03 ENCOUNTER — Other Ambulatory Visit: Payer: Self-pay

## 2021-03-03 ENCOUNTER — Ambulatory Visit: Payer: Medicare HMO

## 2021-03-03 DIAGNOSIS — R41844 Frontal lobe and executive function deficit: Secondary | ICD-10-CM

## 2021-03-03 DIAGNOSIS — R2689 Other abnormalities of gait and mobility: Secondary | ICD-10-CM

## 2021-03-03 DIAGNOSIS — M6281 Muscle weakness (generalized): Secondary | ICD-10-CM

## 2021-03-03 DIAGNOSIS — R2681 Unsteadiness on feet: Secondary | ICD-10-CM

## 2021-03-03 DIAGNOSIS — R41840 Attention and concentration deficit: Secondary | ICD-10-CM

## 2021-03-03 DIAGNOSIS — R278 Other lack of coordination: Secondary | ICD-10-CM

## 2021-03-03 DIAGNOSIS — R208 Other disturbances of skin sensation: Secondary | ICD-10-CM

## 2021-03-03 NOTE — Therapy (Signed)
[facility] [address] [place], [place], [address] Phone: [phone]   Fax:  [phone]  Physical Therapy Treatment  Patient Details  Name: Erica [name] MRN: [id number] Date of Birth: 04/20/1956 Referring Provider (PT): [name]   Encounter Date: 03/03/2021   PT End of Session - 03/03/21 1358     Visit Number 3    Number of Visits 17    Date for PT Re-Evaluation 04/16/21    Authorization Type [employer] medicare    PT Start Time 5573    PT Stop Time 1446    PT Time Calculation (min) 49 min    Equipment Utilized During Treatment Gait belt    Activity Tolerance Patient tolerated treatment well    Behavior During Therapy [facility] for tasks assessed/performed             Past Medical History:  Diagnosis Date   Arthritis    especially in shoulders   Asthma    Depression    Diabetes mellitus    Embolic stroke ([place]) 08/02/2540   GERD (gastroesophageal reflux disease)    Headache(784.0)    "mild"   Hypertension    Loop Biotronik loop implant 12/16/2020 12/16/2020   Mental disorder    depression   Neuropathy    feet    Pain    arthritis pain - takes tramadol as needed   Rectal polyp    very little bleeding with bowel movements- no pain   Stroke ([place]) 11/03/2020   Suicide attempt [facility])     Past Surgical History:  Procedure Laterality Date   ANAL RECTAL MANOMETRY N/A 07/13/2016   Procedure: ANO RECTAL MANOMETRY;  Surgeon: Leighton Ruff, MD;  [facility];  Service: Endoscopy;  Laterality: N/A;   CESAREAN SECTION     EUS N/A 11/21/2012   Procedure: LOWER ENDOSCOPIC ULTRASOUND (EUS);  Surgeon: Arta Silence, MD;  [facility];  Service: Endoscopy;  Laterality: N/A;   FLEXIBLE SIGMOIDOSCOPY N/A 11/21/2012   Procedure: FLEXIBLE SIGMOIDOSCOPY;  Surgeon: Arta Silence, MD;  [facility];  Service: Endoscopy;  Laterality: N/A;   FLEXIBLE SIGMOIDOSCOPY N/A 01/28/2013   Procedure:  FLEXIBLE SIGMOIDOSCOPY;  Surgeon: Leighton Ruff, MD;  [facility];  Service: Endoscopy;  Laterality: N/A;   LAPAROSCOPIC LOW ANTERIOR RESECTION N/A 01/29/2013   Procedure: LAPAROSCOPIC LOW ANTERIOR RESECTION, Rigid Proctoscopy;  Surgeon: Leighton Ruff, MD;  [facility];  Service: General;  Laterality: N/A;   LAPAROSCOPIC SIGMOID COLECTOMY N/A 11/14/2012   Procedure: DIAGNOSTIC LAPAROSCOPY AND SIGMOIDMOIDOSCOPY ;  Surgeon: Rolm Bookbinder, MD;  [facility];  Service: General;  Laterality: N/A;   RECTAL ULTRASOUND N/A 07/13/2016   Procedure: RECTAL ULTRASOUND;  Surgeon: Leighton Ruff, MD;  [facility];  Service: Endoscopy;  Laterality: N/A;   TONSILLECTOMY     TONSILLECTOMY AND ADENOIDECTOMY      There were no vitals filed for this visit.   Subjective Assessment - 03/03/21 1358     Subjective Pt reports doing well. Left leg feeling a little stronger. Using her cane some at home.    Pertinent History arthritis, asthma, depression, HTN, neuropathy, suicide attempt, DM, CVA    Patient Stated Goals Pt would like to be able to walk and use her hands better.    Currently in Pain? No/denies                               [facility] Adult  PT Treatment/Exercise - 03/03/21 1359       Ambulation/Gait   Ambulation/Gait Yes    Ambulation/Gait Assistance 5: Supervision    Ambulation/Gait Assistance Details Verbal cues to try to increase left foot clearance and heel strike    Ambulation Distance (Feet) 200 Feet    Assistive device Rolling walker    Gait Pattern Step-through pattern;Decreased hip/knee flexion - left;Decreased stance time - left;Decreased step length - right    Ambulation Surface Level;Indoor      Neuro Re-ed    Neuro Re-ed Details  Sit to stand x 5 without hands with red theraband from chair then added staggered feet with left foot slightly posterior to increase LLE use x 5. In // bars: side stepping without UE support with red theraband  around thighs 8' x 4 with verbal cues to pick up feet. Step-ups on 4" step with LLE x 10 then x 10 with tapping cone with RLE in front with PT providing light tactile cues and stabilization at left knee, step-ups on rockerboard positioned ant/post x 10 with LLE with only RUE support. Standing on rockerboard trying to maintain level x 30 sec without UE support with CGA/min assist as would lose balance posterior at times, rocking board ant/post x 8 CGA.      Exercises   Exercises Other Exercises;Knee/Hip    Other Exercises  Bridges with hip abduction at top x 10 with red theraband with verbal cues for form, left clamshell x 10 with cues for form with red theraband.      Knee/Hip Exercises: Aerobic   Other Aerobic Sci Fit x 5 min level 3 arms and legs                     PT Education - 03/03/21 1557     Education Details Gave pt red theraband to add to bridges with abduction and clamshell    Person(s) Educated Patient    Methods Explanation    Comprehension Verbalized understanding              PT Short Term Goals - 02/17/21 1660       PT SHORT TERM GOAL #1   Title Pt will be independent with initial strengthening and balance HEP.    Time 4    Period Weeks    Status New    Target Date 03/17/21      PT SHORT TERM GOAL #2   Title Pt will decrease 5 x sit to stand from 28.59 sec to <20 sec for improved balance and functional mobility.    Baseline 02/16/21 28.59 sec    Time 4    Period Weeks    Status New    Target Date 03/17/21      PT SHORT TERM GOAL #3   Title Pt will increase Berg from 38 to 42/56 for improved balance and decreased falls risk.    Baseline 02/16/21 38/56    Time 4    Period Weeks    Status New    Target Date 03/17/21      PT SHORT TERM GOAL #4   Title Pt will ambulate >500' with RW on level surfaces mod I for improved household and short community distances.    Time 4    Period Weeks    Status New    Target Date 03/17/21                PT Long Term Goals - 02/17/21 6004  PT LONG TERM GOAL #1   Title Pt will be independent with progressive HEP for strengthening and balance to continue gains on own.    Time 8    Period Weeks    Status New    Target Date 04/16/21      PT LONG TERM GOAL #2   Title Pt will increase gait speed to >0.16ms for improved gait safety.    Baseline 02/16/21 0.342m    Time 8    Period Weeks    Status New    Target Date 04/16/21      PT LONG TERM GOAL #3   Title Pt will increase Berg Balance to >45/56 for improved balance and decreased fall risk.    Baseline 02/16/21 38/56    Time 8    Period Weeks    Status New    Target Date 04/16/21      PT LONG TERM GOAL #4   Title Pt will ambulate >800' on varied surfaces with LRAD mod I for improved short community distances.    Time 8    Period Weeks    Status New    Target Date 04/16/21      PT LONG TERM GOAL #5   Title Pt will ambulate up/down 4 steps with 1 rail mod I to safely enter/exit home.    Time 8    Period Weeks    Status New    Target Date 04/16/21      Additional Long Term Goals   Additional Long Term Goals Yes      PT LONG TERM GOAL #6   Title Pt will increase FOTO from 50 to 60.    Baseline 02/16/21 50    Time 8    Period Weeks    Status New    Target Date 04/16/21                   Plan - 03/03/21 1557     Clinical Impression Statement Pt was able to show improving left knee control with step-up activities today. Needs cues to increase left foot clearance and stay tall with gait.    Personal Factors and Comorbidities Comorbidity 3+    Comorbidities arthritis, asthma, depression, HTN, neuropathy, suicide attempt, DM    Examination-Activity Limitations Locomotion Level;Transfers;Stand;Stairs;Squat    Examination-Participation Restrictions Community Activity;Driving;Yard Work;Cleaning    Stability/Clinical Decision Making Evolving/Moderate complexity    Rehab Potential Good    PT Frequency 2x /  week   plus eval   PT Duration 8 weeks    PT Treatment/Interventions ADLs/Self Care Home Management;DME Instruction;Neuromuscular re-education;Manual techniques;Balance training;Therapeutic exercise;Therapeutic activities;Functional mobility training;Stair training;Gait training;Vestibular;Passive range of motion    PT Next Visit Plan Continue LLE strengthening and balance training. Gait training with walker and along counter/ parallel bars trying to work on decreasing UE support. Step-ups with LLE working on control.             Patient will benefit from skilled therapeutic intervention in order to improve the following deficits and impairments:  Abnormal gait, Decreased balance, Decreased activity tolerance, Decreased mobility, Decreased strength, Decreased knowledge of use of DME, Decreased endurance, Impaired sensation  Visit Diagnosis: Other abnormalities of gait and mobility  Muscle weakness (generalized)     Problem List Patient Active Problem List   Diagnosis Date Noted   Encounter for loop recorder check 01/02/2021   Loop Biotronik loop implant 12/16/2020 12/16/2020   Leukopenia    Controlled type 2 diabetes mellitus with hyperglycemia,  with long-term current use of insulin (HCC)    Vascular headache    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0-32.9,adult 06/03/2020   Left upper lobe pneumonia 03/30/2020   Chest pain 10/01/2019   MDD  (major depressive disorder), recurrent severe, without psychosis ([place]) 05/09/2019   Suicide attempt ([place]) 05/09/2019   Cocaine abuse ([place]) 05/09/2019   Interstitial lung disease ([place]) 06/21/2016   COPD (chronic obstructive pulmonary disease) ([place]) 06/21/2016   Cough 06/21/2016   GERD (gastroesophageal reflux disease) 06/21/2016   Hypersomnia 06/21/2016   Tobacco user 06/21/2016   Rectal cancer ([place]) 01/30/2013    Electa Sniff, PT, DPT, NCS 03/03/2021, 3:59 PM  [facility] [address] [place], [place], [address] Phone: [phone]   Fax:  [phone]  Name: Erica [name] MRN: [id number] Date of Birth: Mar 29, 1956

## 2021-03-03 NOTE — Therapy (Signed)
[facility] [address] [place], [place], [address] Phone: [phone]   Fax:  [phone]  Occupational Therapy Treatment  Patient Details  Name: Erica [name] MRN: [id number] Date of Birth: Oct 19, 1955 Referring Provider (OT): Dr. Letta Pate   Encounter Date: 03/03/2021   OT End of Session - 03/03/21 1448     Visit Number 3    Number of Visits 25    Date for OT Re-Evaluation 05/19/21    Authorization Type Medicare/ Medicaid    Authorization - Visit Number 3    Progress Note Due on Visit 10    OT Start Time 1447    OT Stop Time 1527    OT Time Calculation (min) 40 min    Activity Tolerance Patient tolerated treatment well    Behavior During Therapy [facility] for tasks assessed/performed             Past Medical History:  Diagnosis Date   Arthritis    especially in shoulders   Asthma    Depression    Diabetes mellitus    Embolic stroke ([place]) 9/93/7169   GERD (gastroesophageal reflux disease)    Headache(784.0)    "mild"   Hypertension    Loop Biotronik loop implant 12/16/2020 12/16/2020   Mental disorder    depression   Neuropathy    feet    Pain    arthritis pain - takes tramadol as needed   Rectal polyp    very little bleeding with bowel movements- no pain   Stroke ([place]) 11/03/2020   Suicide attempt [facility])     Past Surgical History:  Procedure Laterality Date   ANAL RECTAL MANOMETRY N/A 07/13/2016   Procedure: ANO RECTAL MANOMETRY;  Surgeon: Leighton Ruff, MD;  [facility];  Service: Endoscopy;  Laterality: N/A;   CESAREAN SECTION     EUS N/A 11/21/2012   Procedure: LOWER ENDOSCOPIC ULTRASOUND (EUS);  Surgeon: Arta Silence, MD;  Location: [facility];  Service: Endoscopy;  Laterality: N/A;   FLEXIBLE SIGMOIDOSCOPY N/A 11/21/2012   Procedure: FLEXIBLE SIGMOIDOSCOPY;  Surgeon: Arta Silence, MD;  [facility];  Service: Endoscopy;  Laterality: N/A;   FLEXIBLE SIGMOIDOSCOPY N/A  01/28/2013   Procedure: FLEXIBLE SIGMOIDOSCOPY;  Surgeon: Leighton Ruff, MD;  [facility];  Service: Endoscopy;  Laterality: N/A;   LAPAROSCOPIC LOW ANTERIOR RESECTION N/A 01/29/2013   Procedure: LAPAROSCOPIC LOW ANTERIOR RESECTION, Rigid Proctoscopy;  Surgeon: Leighton Ruff, MD;  [facility];  Service: General;  Laterality: N/A;   LAPAROSCOPIC SIGMOID COLECTOMY N/A 11/14/2012   Procedure: DIAGNOSTIC LAPAROSCOPY AND SIGMOIDMOIDOSCOPY ;  Surgeon: Rolm Bookbinder, MD;  [facility];  Service: General;  Laterality: N/A;   RECTAL ULTRASOUND N/A 07/13/2016   Procedure: RECTAL ULTRASOUND;  Surgeon: Leighton Ruff, MD;  [facility];  Service: Endoscopy;  Laterality: N/A;   TONSILLECTOMY     TONSILLECTOMY AND ADENOIDECTOMY      There were no vitals filed for this visit.   Subjective Assessment - 03/03/21 1448     Subjective  Denies pain reports numbness only    Pertinent History Referred by [name] 01/21/21 , post-stroke  History of right parieto-occipital infarct. Most recent stroke 11/03/20. PMH: arthritis, asthma, depression, HTN, neuropathy, suicide attempt, DM    Patient Stated Goals do more stuff around the house    Currently in Pain? No/denies               Treatment: closed chain shoulder flexion with foam roll to eye  level only, min v.c to avoid shoulder hike. Midrange functional reach to place and remove graded clothespins with LUE, min v.c to avoid compensation Copying small peg design with LUE for increased coordination, min v.c for design and to avoid shoulder hike. Removing with in hand manipulation, min v.c Standing to fold laundry with bilateral UE's with good performance. Arm bike x 5 mins level 1 for reciprocal movement. Rotating 2 golf balls in left hand max difficulty.                      OT Short Term Goals - 02/24/21 1058       OT SHORT TERM GOAL #1   Title I with inital  HEP- 03/24/21    Time 4    Period  Weeks    Status New    Target Date 03/24/21      OT SHORT TERM GOAL #2   Title I with sensory precautions    Time 4    Period Weeks    Status New      OT SHORT TERM GOAL #3   Title Pt will perform basic home mangement with supervision    Time 4    Period Weeks    Status New      OT SHORT TERM GOAL #4   Title Pt will perform basic cooking with supervision demonstrating good safety awareness.    Time 4    Period Weeks    Status New      OT SHORT TERM GOAL #5   Title Pt will perform all basic ADLS modified independently.    Time 4    Period Weeks    Status New      Additional Short Term Goals   Additional Short Term Goals Yes      OT SHORT TERM GOAL #6   Title Pt will retrieve a lightweight object at 115 shoulder flexion with LUE without dropping    Baseline 105*    Time 4    Period Weeks    Status New               OT Long Term Goals - 02/24/21 1158       OT LONG TERM GOAL #1   Title I with updated HEP. 05/19/21    Time 12    Period Weeks    Status New    Target Date 05/19/21      OT LONG TERM GOAL #2   Title Pt will demonstrate improved fine motor coordination for ADLs as evidenced  by decreasing LUE 9 hole peg test to 28 secs without drops.    Time 12    Period Weeks    Status New      OT LONG TERM GOAL #3   Title Pt will demonstrate ability to retrieve a 2 lbs object from overhead shelf without drops with LUE.    Time 12    Period Weeks    Status New      OT LONG TERM GOAL #4   Title Pt will perform all basic ADLs modified Indpendently.    Time 12    Period Weeks    Status New      OT LONG TERM GOAL #5   Title Pt will perfom basic home management/ cooking mod I with good safety awareness.    Time 12    Period Weeks    Status New      Long Term Additional Goals  Additional Long Term Goals Yes      OT LONG TERM GOAL #6   Title I with memory compensation strategies.    Time 12    Period Weeks    Status New                    Plan - 03/03/21 1451     Clinical Impression Statement Pt is progressing towards goals. She demonstrates  improving LUE functional use.    OT Occupational Profile and History Detailed Assessment- Review of Records and additional review of physical, cognitive, psychosocial history related to current functional performance    Occupational performance deficits (Please refer to evaluation for details): ADL's;IADL's;Leisure    Body Structure / Function / Physical Skills ADL;Endurance;UE functional use;Balance;Flexibility;FMC;Gait;ROM;GMC;Coordination;Decreased knowledge of precautions;Sensation;IADL;Decreased knowledge of use of DME;Dexterity;Mobility    Cognitive Skills Attention;Memory    Rehab Potential Good    Clinical Decision Making Limited treatment options, no task modification necessary    Comorbidities Affecting Occupational Performance: May have comorbidities impacting occupational performance    Modification or Assistance to Complete Evaluation  No modification of tasks or assist necessary to complete eval    OT Frequency 2x / week   plus eval, will likely d/c after 8 weeks dependent on progress   OT Duration 12 weeks    OT Treatment/Interventions Self-care/ADL training;Ultrasound;Patient/family education;DME and/or AE instruction;Gait Training;Passive range of motion;Balance training;Fluidtherapy;Cryotherapy;Electrical Stimulation;Therapist, nutritional;Therapeutic activities;Manual Therapy;Therapeutic exercise;Cognitive remediation/compensation;Neuromuscular education    Plan review supine exercises with foam roll,  simple snack or cooking task, coordination    Consulted and Agree with Plan of Care Patient             Patient will benefit from skilled therapeutic intervention in order to improve the following deficits and impairments:   Body Structure / Function / Physical Skills: ADL, Endurance, UE functional use, Balance, Flexibility, FMC, Gait, ROM, GMC,  Coordination, Decreased knowledge of precautions, Sensation, IADL, Decreased knowledge of use of DME, Dexterity, Mobility Cognitive Skills: Attention, Memory     Visit Diagnosis: Muscle weakness (generalized)  Other lack of coordination  Frontal lobe and executive function deficit  Attention and concentration deficit  Other disturbances of skin sensation  Unsteadiness on feet    Problem List Patient Active Problem List   Diagnosis Date Noted   Encounter for loop recorder check 01/02/2021   Loop Biotronik loop implant 12/16/2020 12/16/2020   Leukopenia    Controlled type 2 diabetes mellitus with hyperglycemia, with long-term current use of insulin (HCC)    Vascular headache    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   Embolic stroke ([place]) 35/57/3220   Sinus bradycardia on ECG 11/03/2020   Benign essential HTN    Pancytopenia (HCC)    Stage 3b chronic kidney disease ([place])    Anemia    Labile blood glucose    Labile blood pressure    Diabetic peripheral neuropathy (HCC)    Essential hypertension    AKI (acute kidney injury) ([place])    Ataxia, late effect of cerebrovascular disease    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[place]) 06/30/2020   Acute CVA (cerebrovascular accident) ([place]) 06/26/2020   CVA (cerebral vascular accident) ([place]) 06/25/2020   BMI 32.0-32.9,adult 06/03/2020   Left upper lobe pneumonia 03/30/2020   Chest pain 10/01/2019   MDD (major depressive disorder), recurrent severe, without psychosis ([place]) 05/09/2019   Suicide attempt ([place]) 05/09/2019   Cocaine abuse ([place]) 05/09/2019   Interstitial lung disease ([place]) 06/21/2016   COPD (chronic obstructive pulmonary disease) (  [place]) 06/21/2016   Cough 06/21/2016   GERD (gastroesophageal reflux disease) 06/21/2016   Hypersomnia 06/21/2016   Tobacco user 06/21/2016   Rectal cancer ([place]) 01/30/2013    Emylee Decelle, OT/L 03/03/2021, 3:13 PM  [facility] [address]  [place], [place], [address] Phone: [phone]   Fax:  [phone]  Name: JOBY HERSHKOWITZ MRN: [id number] Date of Birth: 09/13/1955

## 2021-03-05 ENCOUNTER — Ambulatory Visit: Payer: Medicare HMO | Admitting: Occupational Therapy

## 2021-03-05 ENCOUNTER — Encounter: Payer: Self-pay | Admitting: Occupational Therapy

## 2021-03-05 ENCOUNTER — Other Ambulatory Visit: Payer: Self-pay

## 2021-03-05 ENCOUNTER — Ambulatory Visit: Payer: Medicare HMO

## 2021-03-05 DIAGNOSIS — R278 Other lack of coordination: Secondary | ICD-10-CM

## 2021-03-05 DIAGNOSIS — R41844 Frontal lobe and executive function deficit: Secondary | ICD-10-CM

## 2021-03-05 DIAGNOSIS — M6281 Muscle weakness (generalized): Secondary | ICD-10-CM

## 2021-03-05 DIAGNOSIS — R41840 Attention and concentration deficit: Secondary | ICD-10-CM

## 2021-03-05 DIAGNOSIS — R208 Other disturbances of skin sensation: Secondary | ICD-10-CM

## 2021-03-05 DIAGNOSIS — R2681 Unsteadiness on feet: Secondary | ICD-10-CM

## 2021-03-05 DIAGNOSIS — R2689 Other abnormalities of gait and mobility: Secondary | ICD-10-CM

## 2021-03-05 NOTE — Therapy (Signed)
[facility] [address] [place], [place], [address] Phone: [phone]   Fax:  [phone]  Occupational Therapy Treatment  Patient Details  Name: Erica [name] MRN: [id number] Date of Birth: 1956/01/19 Referring Provider (OT): Dr. Letta Pate   Encounter Date: 03/05/2021   OT End of Session - 03/05/21 1104     Visit Number 4    Number of Visits 25    Date for OT Re-Evaluation 05/19/21    Authorization Type Medicare/ Medicaid    Authorization - Visit Number 4    Progress Note Due on Visit 10    OT Start Time 1104    OT Stop Time 1145    OT Time Calculation (min) 41 min    Activity Tolerance Patient tolerated treatment well    Behavior During Therapy [facility] for tasks assessed/performed             Past Medical History:  Diagnosis Date   Arthritis    especially in shoulders   Asthma    Depression    Diabetes mellitus    Embolic stroke ([place]) 0/12/6224   GERD (gastroesophageal reflux disease)    Headache(784.0)    "mild"   Hypertension    Loop Biotronik loop implant 12/16/2020 12/16/2020   Mental disorder    depression   Neuropathy    feet    Pain    arthritis pain - takes tramadol as needed   Rectal polyp    very little bleeding with bowel movements- no pain   Stroke ([place]) 11/03/2020   Suicide attempt [facility])     Past Surgical History:  Procedure Laterality Date   ANAL RECTAL MANOMETRY N/A 07/13/2016   Procedure: ANO RECTAL MANOMETRY;  Surgeon: Leighton Ruff, MD;  [facility];  Service: Endoscopy;  Laterality: N/A;   CESAREAN SECTION     EUS N/A 11/21/2012   Procedure: LOWER ENDOSCOPIC ULTRASOUND (EUS);  Surgeon: Arta Silence, MD;  [facility];  Service: Endoscopy;  Laterality: N/A;   FLEXIBLE SIGMOIDOSCOPY N/A 11/21/2012   Procedure: FLEXIBLE SIGMOIDOSCOPY;  Surgeon: Arta Silence, MD;  [facility];  Service: Endoscopy;  Laterality: N/A;   FLEXIBLE SIGMOIDOSCOPY N/A  01/28/2013   Procedure: FLEXIBLE SIGMOIDOSCOPY;  Surgeon: Leighton Ruff, MD;  [facility];  Service: Endoscopy;  Laterality: N/A;   LAPAROSCOPIC LOW ANTERIOR RESECTION N/A 01/29/2013   Procedure: LAPAROSCOPIC LOW ANTERIOR RESECTION, Rigid Proctoscopy;  Surgeon: Leighton Ruff, MD;  [facility];  Service: General;  Laterality: N/A;   LAPAROSCOPIC SIGMOID COLECTOMY N/A 11/14/2012   Procedure: DIAGNOSTIC LAPAROSCOPY AND SIGMOIDMOIDOSCOPY ;  Surgeon: Rolm Bookbinder, MD;  [facility];  Service: General;  Laterality: N/A;   RECTAL ULTRASOUND N/A 07/13/2016   Procedure: RECTAL ULTRASOUND;  Surgeon: Leighton Ruff, MD;  [facility];  Service: Endoscopy;  Laterality: N/A;   TONSILLECTOMY     TONSILLECTOMY AND ADENOIDECTOMY      There were no vitals filed for this visit.   Subjective Assessment - 03/05/21 1104     Subjective  Pt denies pain currently. Reports numbness in LUE hand and some in face.    Pertinent History Referred by [name] 01/21/21 , post-stroke  History of right parieto-occipital infarct. Most recent stroke 11/03/20. PMH: arthritis, asthma, depression, HTN, neuropathy, suicide attempt, DM    Patient Stated Goals do more stuff around the house    Currently in Pain? No/denies    Pain Score 0-No pain  ADLs pt reports cooking a lot of things and doing fine with ADLs and IADLs. Pt reports still challenging to reach into higher cabinets. Reviewed sensory precautions with cooking and patient verbalized understanding.   Resistance Clothespins 1-8# with LUE with overhead reaching and with sustained grasp in LUE. Pt with good overhead reach and with good sustained grasp with activity. Stood for higher level with good balance and reaching.   Grooved Pegs with LUE with good coordination - no drops and no difficulty. Removed with tweezers  Seated Cane Exercises BUE unweighted dowel x 10 reps: shoulder flexion (eye level), abduction,  circumduction, bicep curls, underhand row, chest press  Arm Bike: for conditioning and reciprocal movements on Level 1 for 6 minutes (forward and backward)                    OT Education - 03/05/21 1146     Education Details upgraded supine shoulder HEP to supine or seated - lower level only (eye leve)    Person(s) Educated Patient    Methods Explanation;Demonstration    Comprehension Verbalized understanding;Returned demonstration              OT Short Term Goals - 03/05/21 1106       OT SHORT TERM GOAL #1   Title I with inital  HEP- 03/24/21    Time 4    Period Weeks    Status On-going    Target Date 03/24/21      OT SHORT TERM GOAL #2   Title I with sensory precautions    Time 4    Period Weeks    Status On-going   03/05/21 verbalized precautions of testing with RUE and no issues at  home - review in clinic     OT [place] #3   Title Pt will perform basic home mangement with supervision    Time 4    Period Weeks    Status On-going   03/05/21 reports no difficulty with home management tasks and laundry     OT SHORT TERM GOAL #4   Title Pt will perform basic cooking with supervision demonstrating good safety awareness.    Time 4    Period Weeks    Status On-going   03/05/21 reports completing cooking at home with no difficulty - assess in clinic     OT SHORT TERM GOAL #5   Title Pt will perform all basic ADLS modified independently.    Time 4    Period Weeks    Status On-going      OT SHORT TERM GOAL #6   Title Pt will retrieve a lightweight object at 115 shoulder flexion with LUE without dropping    Baseline 105*    Time 4    Period Weeks    Status On-going               OT Long Term Goals - 02/24/21 1158       OT LONG TERM GOAL #1   Title I with updated HEP. 05/19/21    Time 12    Period Weeks    Status New    Target Date 05/19/21      OT LONG TERM GOAL #2   Title Pt will demonstrate improved fine motor coordination  for ADLs as evidenced  by decreasing LUE 9 hole peg test to 28 secs without drops.    Time 12    Period Weeks    Status New  OT LONG TERM GOAL #3   Title Pt will demonstrate ability to retrieve a 2 lbs object from overhead shelf without drops with LUE.    Time 12    Period Weeks    Status New      OT LONG TERM GOAL #4   Title Pt will perform all basic ADLs modified Indpendently.    Time 12    Period Weeks    Status New      OT LONG TERM GOAL #5   Title Pt will perfom basic home management/ cooking mod I with good safety awareness.    Time 12    Period Weeks    Status New      Long Term Additional Goals   Additional Long Term Goals Yes      OT LONG TERM GOAL #6   Title I with memory compensation strategies.    Time 12    Period Weeks    Status New                   Plan - 03/05/21 1138     Clinical Impression Statement Pt progressing towards unmet goals - improved coordination and strength noted with LUE.    OT Occupational Profile and History Detailed Assessment- Review of Records and additional review of physical, cognitive, psychosocial history related to current functional performance    Occupational performance deficits (Please refer to evaluation for details): ADL's;IADL's;Leisure    Body Structure / Function / Physical Skills ADL;Endurance;UE functional use;Balance;Flexibility;FMC;Gait;ROM;GMC;Coordination;Decreased knowledge of precautions;Sensation;IADL;Decreased knowledge of use of DME;Dexterity;Mobility    Cognitive Skills Attention;Memory    Rehab Potential Good    Clinical Decision Making Limited treatment options, no task modification necessary    Comorbidities Affecting Occupational Performance: May have comorbidities impacting occupational performance    Modification or Assistance to Complete Evaluation  No modification of tasks or assist necessary to complete eval    OT Frequency 2x / week   plus eval, will likely d/c after 8 weeks dependent  on progress   OT Duration 12 weeks    OT Treatment/Interventions Self-care/ADL training;Ultrasound;Patient/family education;DME and/or AE instruction;Gait Training;Passive range of motion;Balance training;Fluidtherapy;Cryotherapy;Electrical Stimulation;Therapist, nutritional;Therapeutic activities;Manual Therapy;Therapeutic exercise;Cognitive remediation/compensation;Neuromuscular education    Plan review HEPs as needed and add to them, simple snack or cooking task, coordination    Consulted and Agree with Plan of Care Patient             Patient will benefit from skilled therapeutic intervention in order to improve the following deficits and impairments:   Body Structure / Function / Physical Skills: ADL, Endurance, UE functional use, Balance, Flexibility, FMC, Gait, ROM, GMC, Coordination, Decreased knowledge of precautions, Sensation, IADL, Decreased knowledge of use of DME, Dexterity, Mobility Cognitive Skills: Attention, Memory     Visit Diagnosis: Muscle weakness (generalized)  Other lack of coordination  Frontal lobe and executive function deficit  Other disturbances of skin sensation  Unsteadiness on feet  Attention and concentration deficit  Other abnormalities of gait and mobility    Problem List Patient Active Problem List   Diagnosis Date Noted   Encounter for loop recorder check 01/02/2021   Loop Biotronik loop implant 12/16/2020 12/16/2020   Leukopenia    Controlled type 2 diabetes mellitus with hyperglycemia, with long-term current use of insulin (HCC)    Vascular headache    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[place]) 01/01/8287   Embolic stroke ([place]) 33/74/4514   Sinus bradycardia on ECG 11/03/2020   Benign essential HTN  Pancytopenia ([place])    Stage 3b chronic kidney disease ([place])    Anemia    Labile blood glucose    Labile blood pressure    Diabetic peripheral neuropathy ([place])    Essential hypertension    AKI (acute kidney injury) ([place])    Ataxia,  late effect of cerebrovascular disease    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[place]) 06/30/2020   Acute CVA (cerebrovascular accident) ([place]) 06/26/2020   CVA (cerebral vascular accident) ([place]) 06/25/2020   BMI 32.0-32.9,adult 06/03/2020   Left upper lobe pneumonia 03/30/2020   Chest pain 10/01/2019   MDD (major depressive disorder), recurrent severe, without psychosis ([place]) 05/09/2019   Suicide attempt ([place]) 05/09/2019   Cocaine abuse ([place]) 05/09/2019   Interstitial lung disease ([place]) 06/21/2016   COPD (chronic obstructive pulmonary disease) ([place]) 06/21/2016   Cough 06/21/2016   GERD (gastroesophageal reflux disease) 06/21/2016   Hypersomnia 06/21/2016   Tobacco user 06/21/2016   Rectal cancer ([place]) 01/30/2013    Zachery Conch, OT/L 03/05/2021, 11:48 AM  [facility] [address] [place], [place], [address] Phone: [phone]   Fax:  [phone]  Name: Erica [name] MRN: [id number] Date of Birth: August 01, 1955

## 2021-03-05 NOTE — Therapy (Signed)
[facility] [address] [place], [place], [address] Phone: [phone]   Fax:  [phone]  Physical Therapy Treatment  Patient Details  Name: Erica [name] MRN: [id number] Date of Birth: [birth year] Referring Provider (PT): [name]   Encounter Date: 03/05/2021   PT End of Session - 03/05/21 1233     Visit Number 4    Number of Visits 17    Date for PT Re-Evaluation 04/16/21    Authorization Type [employer] medicare    PT Start Time 1231    PT Stop Time 1313    PT Time Calculation (min) 42 min    Equipment Utilized During Treatment Gait belt    Activity Tolerance Patient tolerated treatment well    Behavior During Therapy [facility] for tasks assessed/performed             Past Medical History:  Diagnosis Date   Arthritis    especially in shoulders   Asthma    Depression    Diabetes mellitus    Embolic stroke ([place]) 5/67/8893   GERD (gastroesophageal reflux disease)    Headache(784.0)    "mild"   Hypertension    Loop Biotronik loop implant 12/16/2020 12/16/2020   Mental disorder    depression   Neuropathy    feet    Pain    arthritis pain - takes tramadol as needed   Rectal polyp    very little bleeding with bowel movements- no pain   Stroke ([place]) 11/03/2020   Suicide attempt [facility])     Past Surgical History:  Procedure Laterality Date   ANAL RECTAL MANOMETRY N/A 07/13/2016   Procedure: ANO RECTAL MANOMETRY;  Surgeon: Leighton Ruff, MD;  [facility];  Service: Endoscopy;  Laterality: N/A;   CESAREAN SECTION     EUS N/A 11/21/2012   Procedure: LOWER ENDOSCOPIC ULTRASOUND (EUS);  Surgeon: Arta Silence, MD;  Location: [facility];  Service: Endoscopy;  Laterality: N/A;   FLEXIBLE SIGMOIDOSCOPY N/A 11/21/2012   Procedure: FLEXIBLE SIGMOIDOSCOPY;  Surgeon: Arta Silence, MD;  [facility];  Service: Endoscopy;  Laterality: N/A;   FLEXIBLE SIGMOIDOSCOPY N/A 01/28/2013   Procedure:  FLEXIBLE SIGMOIDOSCOPY;  Surgeon: Leighton Ruff, MD;  [facility];  Service: Endoscopy;  Laterality: N/A;   LAPAROSCOPIC LOW ANTERIOR RESECTION N/A 01/29/2013   Procedure: LAPAROSCOPIC LOW ANTERIOR RESECTION, Rigid Proctoscopy;  Surgeon: Leighton Ruff, MD;  [facility];  Service: General;  Laterality: N/A;   LAPAROSCOPIC SIGMOID COLECTOMY N/A 11/14/2012   Procedure: DIAGNOSTIC LAPAROSCOPY AND SIGMOIDMOIDOSCOPY ;  Surgeon: Rolm Bookbinder, MD;  [facility];  Service: General;  Laterality: N/A;   RECTAL ULTRASOUND N/A 07/13/2016   Procedure: RECTAL ULTRASOUND;  Surgeon: Leighton Ruff, MD;  [facility];  Service: Endoscopy;  Laterality: N/A;   TONSILLECTOMY     TONSILLECTOMY AND ADENOIDECTOMY      There were no vitals filed for this visit.   Subjective Assessment - 03/05/21 1233     Subjective Pt reports she is doing well.    Pertinent History arthritis, asthma, depression, HTN, neuropathy, suicide attempt, DM, CVA    Patient Stated Goals Pt would like to be able to walk and use her hands better.    Currently in Pain? No/denies                               [facility] Adult PT Treatment/Exercise - 03/05/21 1234  Ambulation/Gait   Ambulation/Gait Yes    Ambulation/Gait Assistance 5: Supervision    Ambulation/Gait Assistance Details Verbal cues to stand tall and straighten through left leg. Pt reported leg tired towards end.    Ambulation Distance (Feet) 325 Feet    Assistive device Rolling walker    Gait Pattern Step-through pattern;Decreased stance time - left;Decreased step length - right    Ambulation Surface Level;Indoor      Neuro Re-ed    Neuro Re-ed Details  In // bars: gait with 1 UE support 8' x 6 then reciprocal stepping over 4 2x4" bolsters then side stepping over bolsters 8' x 4 with verbal cues for technique. Step-ups on black bolster x 10 with LLE with 1 UE support. Standing on rockerboard positioned ant/post trying to  maintain level 30 sec x 2 CGA. Pt reported feeling really tired thinking it may be her meds so took a couple seated breaks throughout. BP=128/84      Knee/Hip Exercises: Aerobic   Other Aerobic Sci Fit x 5 min level 3 arms and legs for strengthening.                       PT Short Term Goals - 02/17/21 9562       PT SHORT TERM GOAL #1   Title Pt will be independent with initial strengthening and balance HEP.    Time 4    Period Weeks    Status New    Target Date 03/17/21      PT SHORT TERM GOAL #2   Title Pt will decrease 5 x sit to stand from 28.59 sec to <20 sec for improved balance and functional mobility.    Baseline 02/16/21 28.59 sec    Time 4    Period Weeks    Status New    Target Date 03/17/21      PT SHORT TERM GOAL #3   Title Pt will increase Berg from 38 to 42/56 for improved balance and decreased falls risk.    Baseline 02/16/21 38/56    Time 4    Period Weeks    Status New    Target Date 03/17/21      PT SHORT TERM GOAL #4   Title Pt will ambulate >500' with RW on level surfaces mod I for improved household and short community distances.    Time 4    Period Weeks    Status New    Target Date 03/17/21               PT Long Term Goals - 02/17/21 0827       PT LONG TERM GOAL #1   Title Pt will be independent with progressive HEP for strengthening and balance to continue gains on own.    Time 8    Period Weeks    Status New    Target Date 04/16/21      PT LONG TERM GOAL #2   Title Pt will increase gait speed to >0.26ms for improved gait safety.    Baseline 02/16/21 0.377m    Time 8    Period Weeks    Status New    Target Date 04/16/21      PT LONG TERM GOAL #3   Title Pt will increase Berg Balance to >45/56 for improved balance and decreased fall risk.    Baseline 02/16/21 38/56    Time 8    Period Weeks    Status New  Target Date 04/16/21      PT LONG TERM GOAL #4   Title Pt will ambulate >800' on varied surfaces with LRAD  mod I for improved short community distances.    Time 8    Period Weeks    Status New    Target Date 04/16/21      PT LONG TERM GOAL #5   Title Pt will ambulate up/down 4 steps with 1 rail mod I to safely enter/exit home.    Time 8    Period Weeks    Status New    Target Date 04/16/21      Additional Long Term Goals   Additional Long Term Goals Yes      PT LONG TERM GOAL #6   Title Pt will increase FOTO from 50 to 60.    Baseline 02/16/21 50    Time 8    Period Weeks    Status New    Target Date 04/16/21                   Plan - 03/05/21 1919     Clinical Impression Statement Pt continues to progress with gait with walker increasing distances with better left stance time. PT continued to work on strengthening LLE with better knee control noted.    Personal Factors and Comorbidities Comorbidity 3+    Comorbidities arthritis, asthma, depression, HTN, neuropathy, suicide attempt, DM    Examination-Activity Limitations Locomotion Level;Transfers;Stand;Stairs;Squat    Examination-Participation Restrictions Community Activity;Driving;Yard Work;Cleaning    Stability/Clinical Decision Making Evolving/Moderate complexity    Rehab Potential Good    PT Frequency 2x / week   plus eval   PT Duration 8 weeks    PT Treatment/Interventions ADLs/Self Care Home Management;DME Instruction;Neuromuscular re-education;Manual techniques;Balance training;Therapeutic exercise;Therapeutic activities;Functional mobility training;Stair training;Gait training;Vestibular;Passive range of motion    PT Next Visit Plan Continue LLE strengthening and balance training. Gait training with walker and along counter/ parallel bars trying to work on decreasing UE support. Step-ups with LLE working on control.             Patient will benefit from skilled therapeutic intervention in order to improve the following deficits and impairments:  Abnormal gait, Decreased balance, Decreased activity tolerance,  Decreased mobility, Decreased strength, Decreased knowledge of use of DME, Decreased endurance, Impaired sensation  Visit Diagnosis: Other abnormalities of gait and mobility  Muscle weakness (generalized)     Problem List Patient Active Problem List   Diagnosis Date Noted   Encounter for loop recorder check 01/02/2021   Loop Biotronik loop implant 12/16/2020 12/16/2020   Leukopenia    Controlled type 2 diabetes mellitus with hyperglycemia, with long-term current use of insulin (HCC)    Vascular headache    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[place]) 51/88/4166   Embolic stroke ([place]) 12/11/1599   Sinus bradycardia on ECG 11/03/2020   Benign essential HTN    Pancytopenia (HCC)    Stage 3b chronic kidney disease ([place])    Anemia    Labile blood glucose    Labile blood pressure    Diabetic peripheral neuropathy (HCC)    Essential hypertension    AKI (acute kidney injury) ([place])    Ataxia, late effect of cerebrovascular disease    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   Acute CVA (cerebrovascular accident) ([place]) 06/26/2020   CVA (cerebral vascular accident) ([place]) 06/25/2020   BMI 32.0-32.9,adult 06/03/2020   Left upper lobe pneumonia 03/30/2020   Chest pain 10/01/2019   MDD (major depressive disorder),  recurrent severe, without psychosis ([place]) 05/09/2019   Suicide attempt ([place]) 05/09/2019   Cocaine abuse ([place]) 05/09/2019   Interstitial lung disease ([place]) 06/21/2016   COPD (chronic obstructive pulmonary disease) ([place]) 06/21/2016   Cough 06/21/2016   GERD (gastroesophageal reflux disease) 06/21/2016   Hypersomnia 06/21/2016   Tobacco user 06/21/2016   Rectal cancer ([place]) 01/30/2013    Electa Sniff, PT, DPT, NCS 03/05/2021, 7:21 PM  [facility] [address] [place], [place], [address] Phone: [phone]   Fax:  [phone]  Name: Erica [name] MRN: [id number] Date of Birth: 05-12-1956

## 2021-03-08 ENCOUNTER — Ambulatory Visit: Payer: Medicare HMO | Admitting: Physical Therapy

## 2021-03-08 ENCOUNTER — Encounter: Payer: Medicare HMO | Admitting: Occupational Therapy

## 2021-03-09 LAB — CMP14+EGFR
ALT: 14 IU/L (ref 0–32)
AST: 23 IU/L (ref 0–40)
Albumin/Globulin Ratio: 1.3 (ref 1.2–2.2)
Albumin: 4.3 g/dL (ref 3.8–4.8)
Alkaline Phosphatase: 63 IU/L (ref 44–121)
BUN/Creatinine Ratio: 12 (ref 12–28)
BUN: 21 mg/dL (ref 8–27)
Bilirubin Total: 0.8 mg/dL (ref 0.0–1.2)
CO2: 21 mmol/L (ref 20–29)
Calcium: 9.7 mg/dL (ref 8.7–10.3)
Chloride: 105 mmol/L (ref 96–106)
Creatinine, Ser: 1.72 mg/dL — ABNORMAL HIGH (ref 0.57–1.00)
Globulin, Total: 3.2 g/dL (ref 1.5–4.5)
Glucose: 103 mg/dL — ABNORMAL HIGH (ref 70–99)
Potassium: 5.3 mmol/L — ABNORMAL HIGH (ref 3.5–5.2)
Sodium: 141 mmol/L (ref 134–144)
Total Protein: 7.5 g/dL (ref 6.0–8.5)
eGFR: 32 mL/min/{1.73_m2} — ABNORMAL LOW (ref >59)

## 2021-03-09 LAB — LDL CHOLESTEROL, DIRECT: LDL Direct: 37 mg/dL (ref 0–99)

## 2021-03-09 LAB — LIPOPROTEIN A (LPA): Lipoprotein (a): 26.7 nmol/L (ref <75.0)

## 2021-03-09 LAB — LIPID PANEL WITH LDL/HDL RATIO
Cholesterol, Total: 132 mg/dL (ref 100–199)
HDL: 76 mg/dL (ref >39)
LDL Chol Calc (NIH): 45 mg/dL (ref 0–99)
LDL/HDL Ratio: 0.6 ratio (ref 0.0–3.2)
Triglycerides: 46 mg/dL (ref 0–149)
VLDL Cholesterol Cal: 11 mg/dL (ref 5–40)

## 2021-03-10 ENCOUNTER — Ambulatory Visit: Payer: Medicare HMO

## 2021-03-10 ENCOUNTER — Other Ambulatory Visit: Payer: Self-pay

## 2021-03-10 ENCOUNTER — Ambulatory Visit: Payer: Medicare HMO | Admitting: Occupational Therapy

## 2021-03-10 DIAGNOSIS — M6281 Muscle weakness (generalized): Secondary | ICD-10-CM

## 2021-03-10 DIAGNOSIS — R208 Other disturbances of skin sensation: Secondary | ICD-10-CM

## 2021-03-10 DIAGNOSIS — R2689 Other abnormalities of gait and mobility: Secondary | ICD-10-CM

## 2021-03-10 DIAGNOSIS — R41840 Attention and concentration deficit: Secondary | ICD-10-CM

## 2021-03-10 DIAGNOSIS — R41844 Frontal lobe and executive function deficit: Secondary | ICD-10-CM

## 2021-03-10 DIAGNOSIS — R278 Other lack of coordination: Secondary | ICD-10-CM

## 2021-03-10 DIAGNOSIS — R2681 Unsteadiness on feet: Secondary | ICD-10-CM

## 2021-03-10 NOTE — Therapy (Signed)
[facility] [address] [place], [place], [address] Phone: [phone]   Fax:  [phone]  Occupational Therapy Treatment  Patient Details  Name: Erica [name] MRN: [id number] Date of Birth: 1955-11-03 Referring Provider (OT): Dr. Letta Pate   Encounter Date: 03/10/2021   OT End of Session - 03/10/21 1450     Visit Number 5    Number of Visits 25    Date for OT Re-Evaluation 05/19/21    Authorization Type Medicare/ Medicaid    Authorization - Visit Number 5    Progress Note Due on Visit 10    OT Start Time 1448    OT Stop Time 1530    OT Time Calculation (min) 42 min    Activity Tolerance Patient tolerated treatment well    Behavior During Therapy [facility] for tasks assessed/performed             Past Medical History:  Diagnosis Date   Arthritis    especially in shoulders   Asthma    Depression    Diabetes mellitus    Embolic stroke ([place]) 12/23/4578   GERD (gastroesophageal reflux disease)    Headache(784.0)    "mild"   Hypertension    Loop Biotronik loop implant 12/16/2020 12/16/2020   Mental disorder    depression   Neuropathy    feet    Pain    arthritis pain - takes tramadol as needed   Rectal polyp    very little bleeding with bowel movements- no pain   Stroke ([place]) 11/03/2020   Suicide attempt [facility])     Past Surgical History:  Procedure Laterality Date   ANAL RECTAL MANOMETRY N/A 07/13/2016   Procedure: ANO RECTAL MANOMETRY;  Surgeon: Leighton Ruff, MD;  [facility];  Service: Endoscopy;  Laterality: N/A;   CESAREAN SECTION     EUS N/A 11/21/2012   Procedure: LOWER ENDOSCOPIC ULTRASOUND (EUS);  Surgeon: Arta Silence, MD;  [facility];  Service: Endoscopy;  Laterality: N/A;   FLEXIBLE SIGMOIDOSCOPY N/A 11/21/2012   Procedure: FLEXIBLE SIGMOIDOSCOPY;  Surgeon: Arta Silence, MD;  [facility];  Service: Endoscopy;  Laterality: N/A;   FLEXIBLE SIGMOIDOSCOPY N/A  01/28/2013   Procedure: FLEXIBLE SIGMOIDOSCOPY;  Surgeon: Leighton Ruff, MD;  [facility];  Service: Endoscopy;  Laterality: N/A;   LAPAROSCOPIC LOW ANTERIOR RESECTION N/A 01/29/2013   Procedure: LAPAROSCOPIC LOW ANTERIOR RESECTION, Rigid Proctoscopy;  Surgeon: Leighton Ruff, MD;  [facility];  Service: General;  Laterality: N/A;   LAPAROSCOPIC SIGMOID COLECTOMY N/A 11/14/2012   Procedure: DIAGNOSTIC LAPAROSCOPY AND SIGMOIDMOIDOSCOPY ;  Surgeon: Rolm Bookbinder, MD;  [facility];  Service: General;  Laterality: N/A;   RECTAL ULTRASOUND N/A 07/13/2016   Procedure: RECTAL ULTRASOUND;  Surgeon: Leighton Ruff, MD;  [facility];  Service: Endoscopy;  Laterality: N/A;   TONSILLECTOMY     TONSILLECTOMY AND ADENOIDECTOMY      There were no vitals filed for this visit.   Subjective Assessment - 03/10/21 1450     Subjective  Doing ok - pt denies any pain.    Pertinent History Referred by [name] 01/21/21 , post-stroke  History of right parieto-occipital infarct. Most recent stroke 11/03/20. PMH: arthritis, asthma, depression, HTN, neuropathy, suicide attempt, DM    Patient Stated Goals do more stuff around the house    Currently in Pain? No/denies    Pain Score 0-No pain              ADLs/IADLs pt  reports doing well with self-care tasks but most challenging with cleaning. Pt report most difficulty with cleaning bathroom but her son has bought her a long handled sponge to clean with.  [place] Pegs with tweezers with LUE with increased time and min difficulty/drops  Small Pegboard in standing with copying pattern with min difficulty and increased time. Pt reached to vertical surface with LUE for placing small pegs for shoulder stability and standing balance and tolerance.                   OT Short Term Goals - 03/10/21 1458       OT SHORT TERM GOAL #1   Title I with inital  HEP- 03/24/21    Time 4    Period Weeks    Status On-going     Target Date 03/24/21      OT SHORT TERM GOAL #2   Title I with sensory precautions    Time 4    Period Weeks    Status On-going   03/05/21 verbalized precautions of testing with RUE and no issues at  home - review in clinic     OT [place] #3   Title Pt will perform basic home mangement with supervision    Time 4    Period Weeks    Status On-going   03/05/21 reports no difficulty with home management tasks and laundry     OT SHORT TERM GOAL #4   Title Pt will perform basic cooking with supervision demonstrating good safety awareness.    Time 4    Period Weeks    Status On-going   03/05/21 reports completing cooking at home with no difficulty - assess in clinic     OT SHORT TERM GOAL #5   Title Pt will perform all basic ADLS modified independently.    Time 4    Period Weeks    Status Achieved   goes to beauty shop to get hair shampooed 03/10/21     OT SHORT TERM GOAL #6   Title Pt will retrieve a lightweight object at 115 shoulder flexion with LUE without dropping    Baseline 105*    Time 4    Period Weeks    Status On-going               OT Long Term Goals - 02/24/21 1158       OT LONG TERM GOAL #1   Title I with updated HEP. 05/19/21    Time 12    Period Weeks    Status New    Target Date 05/19/21      OT LONG TERM GOAL #2   Title Pt will demonstrate improved fine motor coordination for ADLs as evidenced  by decreasing LUE 9 hole peg test to 28 secs without drops.    Time 12    Period Weeks    Status New      OT LONG TERM GOAL #3   Title Pt will demonstrate ability to retrieve a 2 lbs object from overhead shelf without drops with LUE.    Time 12    Period Weeks    Status New      OT LONG TERM GOAL #4   Title Pt will perform all basic ADLs modified Indpendently.    Time 12    Period Weeks    Status New      OT LONG TERM GOAL #5   Title Pt will perfom basic home management/  cooking mod I with good safety awareness.    Time 12    Period Weeks     Status New      Long Term Additional Goals   Additional Long Term Goals Yes      OT LONG TERM GOAL #6   Title I with memory compensation strategies.    Time 12    Period Weeks    Status New                   Plan - 03/10/21 1717     Clinical Impression Statement Pt continues to progress towards goals. pt with good standing tolerance and shoulder stability during vertical task today.    OT Occupational Profile and History Detailed Assessment- Review of Records and additional review of physical, cognitive, psychosocial history related to current functional performance    Occupational performance deficits (Please refer to evaluation for details): ADL's;IADL's;Leisure    Body Structure / Function / Physical Skills ADL;Endurance;UE functional use;Balance;Flexibility;FMC;Gait;ROM;GMC;Coordination;Decreased knowledge of precautions;Sensation;IADL;Decreased knowledge of use of DME;Dexterity;Mobility    Cognitive Skills Attention;Memory    Rehab Potential Good    Clinical Decision Making Limited treatment options, no task modification necessary    Comorbidities Affecting Occupational Performance: May have comorbidities impacting occupational performance    Modification or Assistance to Complete Evaluation  No modification of tasks or assist necessary to complete eval    OT Frequency 2x / week   plus eval, will likely d/c after 8 weeks dependent on progress   OT Duration 12 weeks    OT Treatment/Interventions Self-care/ADL training;Ultrasound;Patient/family education;DME and/or AE instruction;Gait Training;Passive range of motion;Balance training;Fluidtherapy;Cryotherapy;Electrical Stimulation;Therapist, nutritional;Therapeutic activities;Manual Therapy;Therapeutic exercise;Cognitive remediation/compensation;Neuromuscular education    Plan review HEPs as needed and add to them, simple snack or cooking task, coordination    Consulted and Agree with Plan of Care Patient              Patient will benefit from skilled therapeutic intervention in order to improve the following deficits and impairments:   Body Structure / Function / Physical Skills: ADL, Endurance, UE functional use, Balance, Flexibility, FMC, Gait, ROM, GMC, Coordination, Decreased knowledge of precautions, Sensation, IADL, Decreased knowledge of use of DME, Dexterity, Mobility Cognitive Skills: Attention, Memory     Visit Diagnosis: Muscle weakness (generalized)  Other lack of coordination  Frontal lobe and executive function deficit  Unsteadiness on feet  Attention and concentration deficit  Other disturbances of skin sensation  Other abnormalities of gait and mobility    Problem List Patient Active Problem List   Diagnosis Date Noted   Encounter for loop recorder check 01/02/2021   Loop Biotronik loop implant 12/16/2020 12/16/2020   Leukopenia    Controlled type 2 diabetes mellitus with hyperglycemia, with long-term current use of insulin (HCC)    Vascular headache    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[place]) 16/55/3748   Embolic stroke ([place]) 27/12/8673   Sinus bradycardia on ECG 11/03/2020   Benign essential HTN    Pancytopenia (HCC)    Stage 3b chronic kidney disease ([place])    Anemia    Labile blood glucose    Labile blood pressure    Diabetic peripheral neuropathy (HCC)    Essential hypertension    AKI (acute kidney injury) ([place])    Ataxia, late effect of cerebrovascular disease    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   Acute CVA (cerebrovascular accident) ([place]) 06/26/2020   CVA (cerebral vascular accident) ([place]) 06/25/2020   BMI 32.0-32.9,adult 06/03/2020   Left  upper lobe pneumonia 03/30/2020   Chest pain 10/01/2019   MDD (major depressive disorder), recurrent severe, without psychosis ([place]) 05/09/2019   Suicide attempt ([place]) 05/09/2019   Cocaine abuse ([place]) 05/09/2019   Interstitial lung disease ([place]) 06/21/2016   COPD (chronic obstructive pulmonary disease) ([place])  06/21/2016   Cough 06/21/2016   GERD (gastroesophageal reflux disease) 06/21/2016   Hypersomnia 06/21/2016   Tobacco user 06/21/2016   Rectal cancer ([place]) 01/30/2013    Zachery Conch, OT/L 03/10/2021, 5:18 PM  [facility] [address] [place], [place], [address] Phone: [phone]   Fax:  [phone]  Name: LIRIDONA MASHAW MRN: [id number] Date of Birth: [birth year]

## 2021-03-10 NOTE — Progress Notes (Signed)
Called patient she wasn't home her son answered I will return the call later today

## 2021-03-11 NOTE — Therapy (Signed)
[facility] [address] [place], [place], [address] Phone: [phone]   Fax:  [phone]  Physical Therapy Treatment  Patient Details  Name: Erica [name] MRN: [id number] Date of Birth: 04/26/1956 Referring Provider (PT): [name]   Encounter Date: 03/10/2021   PT End of Session - 03/10/21 1532     Visit Number 5    Number of Visits 17    Date for PT Re-Evaluation 04/16/21    Authorization Type [employer] medicare    PT Start Time 7445    PT Stop Time 1609    PT Time Calculation (min) 39 min    Equipment Utilized During Treatment Gait belt    Activity Tolerance Patient tolerated treatment well    Behavior During Therapy [facility] for tasks assessed/performed             Past Medical History:  Diagnosis Date   Arthritis    especially in shoulders   Asthma    Depression    Diabetes mellitus    Embolic stroke ([place]) 1/46/0479   GERD (gastroesophageal reflux disease)    Headache(784.0)    "mild"   Hypertension    Loop Biotronik loop implant 12/16/2020 12/16/2020   Mental disorder    depression   Neuropathy    feet    Pain    arthritis pain - takes tramadol as needed   Rectal polyp    very little bleeding with bowel movements- no pain   Stroke ([place]) 11/03/2020   Suicide attempt [facility])     Past Surgical History:  Procedure Laterality Date   ANAL RECTAL MANOMETRY N/A 07/13/2016   Procedure: ANO RECTAL MANOMETRY;  Surgeon: Leighton Ruff, MD;  [facility]: [facility];  Service: Endoscopy;  Laterality: N/A;   CESAREAN SECTION     EUS N/A 11/21/2012   Procedure: LOWER ENDOSCOPIC ULTRASOUND (EUS);  Surgeon: Arta Silence, MD;  Location: [facility];  Service: Endoscopy;  Laterality: N/A;   FLEXIBLE SIGMOIDOSCOPY N/A 11/21/2012   Procedure: FLEXIBLE SIGMOIDOSCOPY;  Surgeon: Arta Silence, MD;  [facility];  Service: Endoscopy;  Laterality: N/A;   FLEXIBLE SIGMOIDOSCOPY N/A 01/28/2013   Procedure:  FLEXIBLE SIGMOIDOSCOPY;  Surgeon: Leighton Ruff, MD;  [facility];  Service: Endoscopy;  Laterality: N/A;   LAPAROSCOPIC LOW ANTERIOR RESECTION N/A 01/29/2013   Procedure: LAPAROSCOPIC LOW ANTERIOR RESECTION, Rigid Proctoscopy;  Surgeon: Leighton Ruff, MD;  [facility];  Service: General;  Laterality: N/A;   LAPAROSCOPIC SIGMOID COLECTOMY N/A 11/14/2012   Procedure: DIAGNOSTIC LAPAROSCOPY AND SIGMOIDMOIDOSCOPY ;  Surgeon: Rolm Bookbinder, MD;  [facility];  Service: General;  Laterality: N/A;   RECTAL ULTRASOUND N/A 07/13/2016   Procedure: RECTAL ULTRASOUND;  Surgeon: Leighton Ruff, MD;  [facility];  Service: Endoscopy;  Laterality: N/A;   TONSILLECTOMY     TONSILLECTOMY AND ADENOIDECTOMY      There were no vitals filed for this visit.   Subjective Assessment - 03/10/21 1532     Subjective Pt reports she is doing pretty good. Figured out why she was so tired last time as she had not checked blood sugar and was in 60s when she got home.    Pertinent History arthritis, asthma, depression, HTN, neuropathy, suicide attempt, DM, CVA    Patient Stated Goals Pt would like to be able to walk and use her hands better.    Currently in Pain? No/denies  [facility] Adult PT Treatment/Exercise - 03/10/21 1533       Transfers   Transfers Sit to Stand;Stand to Sit    Sit to Stand 6: Modified independent (Device/Increase time)    Stand to Sit 6: Modified independent (Device/Increase time)      Ambulation/Gait   Ambulation/Gait Yes    Ambulation/Gait Assistance 5: Supervision    Ambulation/Gait Assistance Details Verbal cues to try to stand up tall through left leg and increase stance time.    Ambulation Distance (Feet) 345 Feet   230' with cane with quad tip.   Assistive device Rolling walker    Gait Pattern Step-through pattern;Decreased step length - right;Decreased stance time - left    Ambulation Surface Level;Indoor       Neuro Re-ed    Neuro Re-ed Details  Pt performed sit to stand 5 x 2 with 4" step under right leg to increase left weight shift. Standing with RLE on 4" step to increase left weight shift holding x 20 sec with reaching for targets with PT providing CGA at left knee for safety. Then repeated position with reaching across body with left hand and grabbing bean bag then tossing to basket 14 x 2. Along counter: walking with 1 UE support on right forwards then backing up 8' x 3 each. Reciprocal stepping over 3 low hurdles x 3 bouts wit 1 UE on counter or also adding hand held assist when switched directions.                       PT Short Term Goals - 02/17/21 2883       PT SHORT TERM GOAL #1   Title Pt will be independent with initial strengthening and balance HEP.    Time 4    Period Weeks    Status New    Target Date 03/17/21      PT SHORT TERM GOAL #2   Title Pt will decrease 5 x sit to stand from 28.59 sec to <20 sec for improved balance and functional mobility.    Baseline 02/16/21 28.59 sec    Time 4    Period Weeks    Status New    Target Date 03/17/21      PT SHORT TERM GOAL #3   Title Pt will increase Berg from 38 to 42/56 for improved balance and decreased falls risk.    Baseline 02/16/21 38/56    Time 4    Period Weeks    Status New    Target Date 03/17/21      PT SHORT TERM GOAL #4   Title Pt will ambulate >500' with RW on level surfaces mod I for improved household and short community distances.    Time 4    Period Weeks    Status New    Target Date 03/17/21               PT Long Term Goals - 02/17/21 0827       PT LONG TERM GOAL #1   Title Pt will be independent with progressive HEP for strengthening and balance to continue gains on own.    Time 8    Period Weeks    Status New    Target Date 04/16/21      PT LONG TERM GOAL #2   Title Pt will increase gait speed to >0.14ms for improved gait safety.    Baseline 02/16/21 0.384m    Time 8  Period Weeks    Status New    Target Date 04/16/21      PT LONG TERM GOAL #3   Title Pt will increase Berg Balance to >45/56 for improved balance and decreased fall risk.    Baseline 02/16/21 38/56    Time 8    Period Weeks    Status New    Target Date 04/16/21      PT LONG TERM GOAL #4   Title Pt will ambulate >800' on varied surfaces with LRAD mod I for improved short community distances.    Time 8    Period Weeks    Status New    Target Date 04/16/21      PT LONG TERM GOAL #5   Title Pt will ambulate up/down 4 steps with 1 rail mod I to safely enter/exit home.    Time 8    Period Weeks    Status New    Target Date 04/16/21      Additional Long Term Goals   Additional Long Term Goals Yes      PT LONG TERM GOAL #6   Title Pt will increase FOTO from 50 to 60.    Baseline 02/16/21 50    Time 8    Period Weeks    Status New    Target Date 04/16/21                   Plan - 03/11/21 0811     Clinical Impression Statement PT continued to work on increasing left stance time during gait with strengthening/NMR. Pt was able to trial cane today.    Personal Factors and Comorbidities Comorbidity 3+    Comorbidities arthritis, asthma, depression, HTN, neuropathy, suicide attempt, DM    Examination-Activity Limitations Locomotion Level;Transfers;Stand;Stairs;Squat    Examination-Participation Restrictions Community Activity;Driving;Yard Work;Cleaning    Stability/Clinical Decision Making Evolving/Moderate complexity    Rehab Potential Good    PT Frequency 2x / week   plus eval   PT Duration 8 weeks    PT Treatment/Interventions ADLs/Self Care Home Management;DME Instruction;Neuromuscular re-education;Manual techniques;Balance training;Therapeutic exercise;Therapeutic activities;Functional mobility training;Stair training;Gait training;Vestibular;Passive range of motion    PT Next Visit Plan Check STGs. Continue LLE strengthening and balance training. Gait training with  walker and along counter/ parallel bars trying to work on decreasing UE support as well as with cane with quad tip. Step-ups with LLE working on control.             Patient will benefit from skilled therapeutic intervention in order to improve the following deficits and impairments:  Abnormal gait, Decreased balance, Decreased activity tolerance, Decreased mobility, Decreased strength, Decreased knowledge of use of DME, Decreased endurance, Impaired sensation  Visit Diagnosis: Other abnormalities of gait and mobility  Muscle weakness (generalized)     Problem List Patient Active Problem List   Diagnosis Date Noted   Encounter for loop recorder check 01/02/2021   Loop Biotronik loop implant 12/16/2020 12/16/2020   Leukopenia    Controlled type 2 diabetes mellitus with hyperglycemia, with long-term current use of insulin (HCC)    Vascular headache    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[place]) 16/12/3708   Embolic stroke ([place]) 62/69/4854   Sinus bradycardia on ECG 11/03/2020   Benign essential HTN    Pancytopenia (HCC)    Stage 3b chronic kidney disease ([place])    Anemia    Labile blood glucose    Labile blood pressure    Diabetic peripheral neuropathy ([place])  Essential hypertension    AKI (acute kidney injury) ([place])    Ataxia, late effect of cerebrovascular disease    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   Acute CVA (cerebrovascular accident) ([place]) 06/26/2020   CVA (cerebral vascular accident) ([place]) 06/25/2020   BMI 32.0-32.9,adult 06/03/2020   Left upper lobe pneumonia 03/30/2020   Chest pain 10/01/2019   MDD (major depressive disorder), recurrent severe, without psychosis ([place]) 05/09/2019   Suicide attempt ([place]) 05/09/2019   Cocaine abuse ([place]) 05/09/2019   Interstitial lung disease ([place]) 06/21/2016   COPD (chronic obstructive pulmonary disease) ([place]) 06/21/2016   Cough 06/21/2016   GERD (gastroesophageal reflux disease) 06/21/2016   Hypersomnia 06/21/2016   Tobacco user  06/21/2016   Rectal cancer ([place]) 01/30/2013    Electa Sniff, PT, DPT, NCS 03/11/2021, 8:14 AM  [facility] [address] [place], [place], [address] Phone: [phone]   Fax:  [phone]  Name: DOLOROS KWOLEK MRN: [id number] Date of Birth: 1956/06/07

## 2021-03-11 NOTE — Progress Notes (Signed)
Spoke to patient she is aware of results

## 2021-03-12 ENCOUNTER — Ambulatory Visit: Payer: Medicare HMO | Admitting: Primary Care

## 2021-03-15 ENCOUNTER — Encounter: Payer: Medicare HMO | Admitting: Occupational Therapy

## 2021-03-17 ENCOUNTER — Ambulatory Visit: Payer: Medicare HMO | Admitting: Occupational Therapy

## 2021-03-17 ENCOUNTER — Other Ambulatory Visit: Payer: Self-pay

## 2021-03-17 ENCOUNTER — Ambulatory Visit: Payer: Medicare HMO | Attending: Physical Medicine & Rehabilitation

## 2021-03-17 DIAGNOSIS — R278 Other lack of coordination: Secondary | ICD-10-CM | POA: Insufficient documentation

## 2021-03-17 DIAGNOSIS — R2689 Other abnormalities of gait and mobility: Secondary | ICD-10-CM | POA: Diagnosis present

## 2021-03-17 DIAGNOSIS — R2681 Unsteadiness on feet: Secondary | ICD-10-CM

## 2021-03-17 DIAGNOSIS — R41840 Attention and concentration deficit: Secondary | ICD-10-CM | POA: Diagnosis present

## 2021-03-17 DIAGNOSIS — R41844 Frontal lobe and executive function deficit: Secondary | ICD-10-CM | POA: Insufficient documentation

## 2021-03-17 DIAGNOSIS — R208 Other disturbances of skin sensation: Secondary | ICD-10-CM | POA: Insufficient documentation

## 2021-03-17 DIAGNOSIS — M6281 Muscle weakness (generalized): Secondary | ICD-10-CM

## 2021-03-17 NOTE — Therapy (Signed)
[facility] [address] [place], [place], [address] Phone: [phone]   Fax:  [phone]  Occupational Therapy Treatment  Patient Details  Name: Erica [name] MRN: [id number] Date of Birth: 10-02-1955 Referring Provider (OT): Dr. Letta Pate   Encounter Date: 03/17/2021   OT End of Session - 03/17/21 1523     Visit Number 6    Number of Visits 25    Date for OT Re-Evaluation 05/19/21    Authorization Type Medicare/ Medicaid    Authorization - Visit Number 6    Progress Note Due on Visit 10    OT Start Time 0175    OT Stop Time 1527    OT Time Calculation (min) 40 min             Past Medical History:  Diagnosis Date   Arthritis    especially in shoulders   Asthma    Depression    Diabetes mellitus    Embolic stroke ([place]) 06/14/5850   GERD (gastroesophageal reflux disease)    Headache(784.0)    "mild"   Hypertension    Loop Biotronik loop implant 12/16/2020 12/16/2020   Mental disorder    depression   Neuropathy    feet    Pain    arthritis pain - takes tramadol as needed   Rectal polyp    very little bleeding with bowel movements- no pain   Stroke ([place]) 11/03/2020   Suicide attempt [facility])     Past Surgical History:  Procedure Laterality Date   ANAL RECTAL MANOMETRY N/A 07/13/2016   Procedure: ANO RECTAL MANOMETRY;  Surgeon: Leighton Ruff, MD;  [facility];  Service: Endoscopy;  Laterality: N/A;   CESAREAN SECTION     EUS N/A 11/21/2012   Procedure: LOWER ENDOSCOPIC ULTRASOUND (EUS);  Surgeon: Arta Silence, MD;  [facility];  Service: Endoscopy;  Laterality: N/A;   FLEXIBLE SIGMOIDOSCOPY N/A 11/21/2012   Procedure: FLEXIBLE SIGMOIDOSCOPY;  Surgeon: Arta Silence, MD;  [facility];  Service: Endoscopy;  Laterality: N/A;   FLEXIBLE SIGMOIDOSCOPY N/A 01/28/2013   Procedure: FLEXIBLE SIGMOIDOSCOPY;  Surgeon: Leighton Ruff, MD;  [facility];  Service:  Endoscopy;  Laterality: N/A;   LAPAROSCOPIC LOW ANTERIOR RESECTION N/A 01/29/2013   Procedure: LAPAROSCOPIC LOW ANTERIOR RESECTION, Rigid Proctoscopy;  Surgeon: Leighton Ruff, MD;  [facility];  Service: General;  Laterality: N/A;   LAPAROSCOPIC SIGMOID COLECTOMY N/A 11/14/2012   Procedure: DIAGNOSTIC LAPAROSCOPY AND SIGMOIDMOIDOSCOPY ;  Surgeon: Rolm Bookbinder, MD;  [facility];  Service: General;  Laterality: N/A;   RECTAL ULTRASOUND N/A 07/13/2016   Procedure: RECTAL ULTRASOUND;  Surgeon: Leighton Ruff, MD;  [facility];  Service: Endoscopy;  Laterality: N/A;   TONSILLECTOMY     TONSILLECTOMY AND ADENOIDECTOMY      There were no vitals filed for this visit.   Subjective Assessment - 03/17/21 1521     Subjective  Pt reports increased shoulder pain after pushing a grocery cart last week    Pertinent History Referred by [name] 01/21/21 , post-stroke  History of right parieto-occipital infarct. Most recent stroke 11/03/20. PMH: arthritis, asthma, depression, HTN, neuropathy, suicide attempt, DM    Patient Stated Goals do more stuff around the house    Currently in Pain? Yes    Pain Score 7     Pain Location Shoulder    Pain Orientation Left    Pain Type Chronic pain;Acute pain    Pain Onset In the past 7  days    Pain Frequency Constant    Aggravating Factors  lifting arm, overuse    Pain Relieving Factors rest                   treatment: simple cooking task performed with walker with walker tray. Pt gathered all items and performed task with supervision and min v.c for walker safety. Pt tends to reach too far out of base of support, therapist recommends pt gets closer to what ever she is reaching for and that she holds onto countertop when reaching into low cabinets. Pt turned off the stove without v.c Supine on mat, hotpack applied to left shoulder x 8 mins for pain relief. No adverse reactions. Education provided regarding how to go from supine  to sit, to minimize shoulder pain.                 OT Short Term Goals - 03/17/21 1541       OT SHORT TERM GOAL #1   Title I with inital  HEP- 03/24/21    Time 4    Period Weeks    Status On-going    Target Date 03/24/21      OT SHORT TERM GOAL #2   Title I with sensory precautions    Time 4    Period Weeks    Status On-going   03/05/21 verbalized precautions of testing with RUE and no issues at  home - review in clinic     OT [place] #3   Title Pt will perform basic home mangement with supervision    Time 4    Period Weeks    Status On-going   03/05/21 reports no difficulty with home management tasks and laundry     OT SHORT TERM GOAL #4   Title Pt will perform basic cooking with supervision demonstrating good safety awareness.    Time 4    Period Weeks    Status On-going   03/17/21, supervision and min v.c for walker safety     OT SHORT TERM GOAL #5   Title Pt will perform all basic ADLS modified independently.    Time 4    Period Weeks    Status Achieved   goes to beauty shop to get hair shampooed 03/10/21     OT SHORT TERM GOAL #6   Title Pt will retrieve a lightweight object at 115 shoulder flexion with LUE without dropping    Baseline 105*    Time 4    Period Weeks    Status On-going               OT Long Term Goals - 02/24/21 1158       OT LONG TERM GOAL #1   Title I with updated HEP. 05/19/21    Time 12    Period Weeks    Status New    Target Date 05/19/21      OT LONG TERM GOAL #2   Title Pt will demonstrate improved fine motor coordination for ADLs as evidenced  by decreasing LUE 9 hole peg test to 28 secs without drops.    Time 12    Period Weeks    Status New      OT LONG TERM GOAL #3   Title Pt will demonstrate ability to retrieve a 2 lbs object from overhead shelf without drops with LUE.    Time 12    Period Weeks    Status New  OT LONG TERM GOAL #4   Title Pt will perform all basic ADLs modified Indpendently.     Time 12    Period Weeks    Status New      OT LONG TERM GOAL #5   Title Pt will perfom basic home management/ cooking mod I with good safety awareness.    Time 12    Period Weeks    Status New      Long Term Additional Goals   Additional Long Term Goals Yes      OT LONG TERM GOAL #6   Title I with memory compensation strategies.    Time 12    Period Weeks    Status New                   Plan - 03/17/21 1525     Clinical Impression Statement Pt reports injuring her left shoulder after pushing grocery cart for long period of time. Therpist recommends pt limits reaching with LUE to low range and recommendation that pt does not pick up anything that is heavy.    OT Occupational Profile and History Detailed Assessment- Review of Records and additional review of physical, cognitive, psychosocial history related to current functional performance    Occupational performance deficits (Please refer to evaluation for details): ADL's;IADL's;Leisure    Body Structure / Function / Physical Skills ADL;Endurance;UE functional use;Balance;Flexibility;FMC;Gait;ROM;GMC;Coordination;Decreased knowledge of precautions;Sensation;IADL;Decreased knowledge of use of DME;Dexterity;Mobility    Cognitive Skills Attention;Memory    Rehab Potential Good    Clinical Decision Making Limited treatment options, no task modification necessary    Comorbidities Affecting Occupational Performance: May have comorbidities impacting occupational performance    Modification or Assistance to Complete Evaluation  No modification of tasks or assist necessary to complete eval    OT Frequency 2x / week   plus eval, will likely d/c after 8 weeks dependent on progress   OT Duration 12 weeks    OT Treatment/Interventions Self-care/ADL training;Ultrasound;Patient/family education;DME and/or AE instruction;Gait Training;Passive range of motion;Balance training;Fluidtherapy;Cryotherapy;Electrical Stimulation;Advertising account executive;Therapeutic activities;Manual Therapy;Therapeutic exercise;Cognitive remediation/compensation;Neuromuscular education    Plan monitor shoulder pain,(hx of rotator cuff tear) limit overhead reach, check short term goals, perfrom simple home management tasks with emphasis on walker safety    Consulted and Agree with Plan of Care Patient             Patient will benefit from skilled therapeutic intervention in order to improve the following deficits and impairments:   Body Structure / Function / Physical Skills: ADL, Endurance, UE functional use, Balance, Flexibility, FMC, Gait, ROM, GMC, Coordination, Decreased knowledge of precautions, Sensation, IADL, Decreased knowledge of use of DME, Dexterity, Mobility Cognitive Skills: Attention, Memory     Visit Diagnosis: Muscle weakness (generalized)  Other lack of coordination  Frontal lobe and executive function deficit  Unsteadiness on feet  Other disturbances of skin sensation  Attention and concentration deficit  Other abnormalities of gait and mobility    Problem List Patient Active Problem List   Diagnosis Date Noted   Encounter for loop recorder check 01/02/2021   Loop Biotronik loop implant 12/16/2020 12/16/2020   Leukopenia    Controlled type 2 diabetes mellitus with hyperglycemia, with long-term current use of insulin (HCC)    Vascular headache    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[place]) 76/72/0947   Embolic stroke ([place]) 09/62/8366   Sinus bradycardia on ECG 11/03/2020   Benign essential HTN    Pancytopenia (HCC)    Stage 3b chronic kidney  disease ([place])    Anemia    Labile blood glucose    Labile blood pressure    Diabetic peripheral neuropathy ([place])    Essential hypertension    AKI (acute kidney injury) ([place])    Ataxia, late effect of cerebrovascular disease    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   Acute CVA (cerebrovascular accident) ([place]) 06/26/2020   CVA (cerebral vascular accident) ([place])  06/25/2020   BMI 32.0-32.9,adult 06/03/2020   Left upper lobe pneumonia 03/30/2020   Chest pain 10/01/2019   MDD (major depressive disorder), recurrent severe, without psychosis ([place]) 05/09/2019   Suicide attempt ([place]) 05/09/2019   Cocaine abuse ([place]) 05/09/2019   Interstitial lung disease ([place]) 06/21/2016   COPD (chronic obstructive pulmonary disease) ([place]) 06/21/2016   Cough 06/21/2016   GERD (gastroesophageal reflux disease) 06/21/2016   Hypersomnia 06/21/2016   Tobacco user 06/21/2016   Rectal cancer ([place]) 01/30/2013    Erica [name], OT/L 03/17/2021, 3:42 PM  [facility] [address] [place], [place], [address] Phone: [phone]   Fax:  [phone]  Name: Erica [name] MRN: [id number] Date of Birth: 17-May-1956
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
## 2021-03-19 ENCOUNTER — Ambulatory Visit (INDEPENDENT_AMBULATORY_CARE_PROVIDER_SITE_OTHER): Payer: Medicare HMO | Admitting: Internal Medicine

## 2021-03-19 ENCOUNTER — Ambulatory Visit: Payer: Medicare HMO | Admitting: Occupational Therapy

## 2021-03-19 ENCOUNTER — Ambulatory Visit: Payer: Medicare HMO | Admitting: Internal Medicine

## 2021-03-19 ENCOUNTER — Encounter: Payer: Self-pay | Admitting: Physical Therapy

## 2021-03-19 ENCOUNTER — Other Ambulatory Visit: Payer: Self-pay

## 2021-03-19 ENCOUNTER — Ambulatory Visit: Payer: Medicare HMO | Admitting: Physical Therapy

## 2021-03-19 DIAGNOSIS — R41844 Frontal lobe and executive function deficit: Secondary | ICD-10-CM

## 2021-03-19 DIAGNOSIS — R2681 Unsteadiness on feet: Secondary | ICD-10-CM

## 2021-03-19 DIAGNOSIS — R2689 Other abnormalities of gait and mobility: Secondary | ICD-10-CM

## 2021-03-19 DIAGNOSIS — R278 Other lack of coordination: Secondary | ICD-10-CM

## 2021-03-19 DIAGNOSIS — J84112 Idiopathic pulmonary fibrosis: Secondary | ICD-10-CM

## 2021-03-19 DIAGNOSIS — M6281 Muscle weakness (generalized): Secondary | ICD-10-CM

## 2021-03-19 DIAGNOSIS — R41840 Attention and concentration deficit: Secondary | ICD-10-CM

## 2021-03-19 DIAGNOSIS — R208 Other disturbances of skin sensation: Secondary | ICD-10-CM

## 2021-03-19 LAB — PULMONARY FUNCTION TEST
DL/VA % pred: 50 %
DL/VA: 2.11 ml/min/mmHg/L
DLCO cor % pred: 39 %
DLCO cor: 7.71 ml/min/mmHg
DLCO unc % pred: 39 %
DLCO unc: 7.71 ml/min/mmHg
FEF 25-75 Pre: 2.13 L/sec
FEF2575-%Pred-Pre: 115 %
FEV1-%Pred-Pre: 117 %
FEV1-Pre: 2.29 L
FEV1FVC-%Pred-Pre: 104 %
FEV6-%Pred-Pre: 116 %
FEV6-Pre: 2.79 L
FEV6FVC-%Pred-Pre: 103 %
FVC-%Pred-Pre: 112 %
FVC-Pre: 2.79 L
Pre FEV1/FVC ratio: 82 %
Pre FEV6/FVC Ratio: 100 %

## 2021-03-19 NOTE — Progress Notes (Signed)
Spirometry and Dlco done today. 

## 2021-03-19 NOTE — Therapy (Signed)
[facility] [address] [place], [place], [address] Phone: [phone]   Fax:  [phone]  Occupational Therapy Treatment  Patient Details  Name: Erica [name] MRN: [id number] Date of Birth: May 25, 1956 Referring Provider (OT): Dr. Letta Pate   Encounter Date: 03/19/2021   OT End of Session - 03/19/21 1214     Visit Number 7    Number of Visits 25    Date for OT Re-Evaluation 05/19/21    Authorization Type Medicare/ Medicaid    Authorization - Visit Number 7    Progress Note Due on Visit 10    OT Start Time 1155   Pt went outside   OT Stop Time 1230    OT Time Calculation (min) 35 min             Past Medical History:  Diagnosis Date   Arthritis    especially in shoulders   Asthma    Depression    Diabetes mellitus    Embolic stroke ([place]) 2/83/1517   GERD (gastroesophageal reflux disease)    Headache(784.0)    "mild"   Hypertension    Loop Biotronik loop implant 12/16/2020 12/16/2020   Mental disorder    depression   Neuropathy    feet    Pain    arthritis pain - takes tramadol as needed   Rectal polyp    very little bleeding with bowel movements- no pain   Stroke ([place]) 11/03/2020   Suicide attempt [facility])     Past Surgical History:  Procedure Laterality Date   ANAL RECTAL MANOMETRY N/A 07/13/2016   Procedure: ANO RECTAL MANOMETRY;  Surgeon: Leighton Ruff, MD;  [facility];  Service: Endoscopy;  Laterality: N/A;   CESAREAN SECTION     EUS N/A 11/21/2012   Procedure: LOWER ENDOSCOPIC ULTRASOUND (EUS);  Surgeon: Arta Silence, MD;  Location[facility];  Service: Endoscopy;  Laterality: N/A;   FLEXIBLE SIGMOIDOSCOPY N/A 11/21/2012   Procedure: FLEXIBLE SIGMOIDOSCOPY;  Surgeon: Arta Silence, MD;  [facility];  Service: Endoscopy;  Laterality: N/A;   FLEXIBLE SIGMOIDOSCOPY N/A 01/28/2013   Procedure: FLEXIBLE SIGMOIDOSCOPY;  Surgeon: Leighton Ruff, MD;  [facility];   Service: Endoscopy;  Laterality: N/A;   LAPAROSCOPIC LOW ANTERIOR RESECTION N/A 01/29/2013   Procedure: LAPAROSCOPIC LOW ANTERIOR RESECTION, Rigid Proctoscopy;  Surgeon: Leighton Ruff, MD;  [facility];  Service: General;  Laterality: N/A;   LAPAROSCOPIC SIGMOID COLECTOMY N/A 11/14/2012   Procedure: DIAGNOSTIC LAPAROSCOPY AND SIGMOIDMOIDOSCOPY ;  Surgeon: Rolm Bookbinder, MD;  [facility];  Service: General;  Laterality: N/A;   RECTAL ULTRASOUND N/A 07/13/2016   Procedure: RECTAL ULTRASOUND;  Surgeon: Leighton Ruff, MD;  [facility];  Service: Endoscopy;  Laterality: N/A;   TONSILLECTOMY     TONSILLECTOMY AND ADENOIDECTOMY      There were no vitals filed for this visit.   Subjective Assessment - 03/19/21 1159     Subjective  Pt told PT she was going to BR, but she went and sat in outside alcove, OT found her there, Pt reports she see orhopedic MD next WED regarding new increased shoulder pain    Pertinent History Referred by [name] 01/21/21 , post-stroke  History of right parieto-occipital infarct. Most recent stroke 11/03/20. PMH: arthritis, asthma, depression, HTN, neuropathy, suicide attempt, DM    Patient Stated Goals do more stuff around the house    Currently in Pain? Yes    Pain Score 5     Pain  Location Shoulder    Pain Orientation Left    Pain Descriptors / Indicators Aching    Pain Type Acute pain    Pain Onset 1 to 4 weeks ago    Pain Frequency Constant    Aggravating Factors  lifitng arm, pushing grocery cart    Pain Relieving Factors rest               Treatment: Supine closed chain chest press and shoulder flexion( less than 90*) within tolerated ROM, Seated internal / external rotation with foam noodle, attemped low range abduction, however discontinued due to shoulder pain.  Purdue pegboard for increased LUE fine motor coordination, min difficulty/ v.c, removing with in hand manipulation  Placing graded clothespins on plastic frame  for low range pinch then replacing on rack, 1-8# pinch, min v.c to avoid shoulder hike                     OT Short Term Goals - 03/19/21 1204       OT SHORT TERM GOAL #1   Title I with inital  HEP- 03/24/21    Time 4    Period Weeks    Status On-going    Target Date 03/24/21      OT SHORT TERM GOAL #2   Title I with sensory precautions    Time 4    Period Weeks    Status On-going   needs continued reinforcement, therapist reinforced-03/19/21     OT SHORT TERM GOAL #3   Title Pt will perform basic home mangement with supervision    Time 4    Period Weeks    Status On-going   Pt reports doing laundry but, she is not fully back to home management     OT SHORT TERM GOAL #4   Title Pt will perform basic cooking with supervision demonstrating good safety awareness.    Time 4    Period Weeks    Status On-going   03/17/21, supervision and min v.c for walker safety     OT SHORT TERM GOAL #5   Title Pt will perform all basic ADLS modified independently.    Time 4    Period Weeks    Status Achieved   goes to beauty shop to get hair shampooed 03/10/21     OT SHORT TERM GOAL #6   Title Pt will retrieve a lightweight object at 115 shoulder flexion with LUE without dropping    Baseline 105*    Time 4    Period Weeks    Status On-going               OT Long Term Goals - 02/24/21 1158       OT LONG TERM GOAL #1   Title I with updated HEP. 05/19/21    Time 12    Period Weeks    Status New    Target Date 05/19/21      OT LONG TERM GOAL #2   Title Pt will demonstrate improved fine motor coordination for ADLs as evidenced  by decreasing LUE 9 hole peg test to 28 secs without drops.    Time 12    Period Weeks    Status New      OT LONG TERM GOAL #3   Title Pt will demonstrate ability to retrieve a 2 lbs object from overhead shelf without drops with LUE.    Time 12    Period Weeks    Status New  OT LONG TERM GOAL #4   Title Pt will perform all  basic ADLs modified Indpendently.    Time 12    Period Weeks    Status New      OT LONG TERM GOAL #5   Title Pt will perfom basic home management/ cooking mod I with good safety awareness.    Time 12    Period Weeks    Status New      Long Term Additional Goals   Additional Long Term Goals Yes      OT LONG TERM GOAL #6   Title I with memory compensation strategies.    Time 12    Period Weeks    Status New                   Plan - 03/19/21 1207     Clinical Impression Statement Pt's shoulder pain appears to be improving, She reports she sees ortho MD next Wed regarding shoulder pain after pushing grocery cart.    OT Occupational Profile and History Detailed Assessment- Review of Records and additional review of physical, cognitive, psychosocial history related to current functional performance    Occupational performance deficits (Please refer to evaluation for details): ADL's;IADL's;Leisure    Body Structure / Function / Physical Skills ADL;Endurance;UE functional use;Balance;Flexibility;FMC;Gait;ROM;GMC;Coordination;Decreased knowledge of precautions;Sensation;IADL;Decreased knowledge of use of DME;Dexterity;Mobility    Cognitive Skills Attention;Memory    Rehab Potential Good    Clinical Decision Making Limited treatment options, no task modification necessary    Comorbidities Affecting Occupational Performance: May have comorbidities impacting occupational performance    Modification or Assistance to Complete Evaluation  No modification of tasks or assist necessary to complete eval    OT Frequency 2x / week   plus eval, will likely d/c after 8 weeks dependent on progress   OT Duration 12 weeks    OT Treatment/Interventions Self-care/ADL training;Ultrasound;Patient/family education;DME and/or AE instruction;Gait Training;Passive range of motion;Balance training;Fluidtherapy;Cryotherapy;Electrical Stimulation;Therapist, nutritional;Therapeutic activities;Manual  Therapy;Therapeutic exercise;Cognitive remediation/compensation;Neuromuscular education    Plan monitor shoulder pain,(hx of rotator cuff tear) limit overhead reach, check short term goals, perfrom simple home management tasks with emphasis on walker safety. Follow MD recommendations regarding shoulder    Consulted and Agree with Plan of Care Patient             Patient will benefit from skilled therapeutic intervention in order to improve the following deficits and impairments:   Body Structure / Function / Physical Skills: ADL, Endurance, UE functional use, Balance, Flexibility, FMC, Gait, ROM, GMC, Coordination, Decreased knowledge of precautions, Sensation, IADL, Decreased knowledge of use of DME, Dexterity, Mobility Cognitive Skills: Attention, Memory     Visit Diagnosis: Muscle weakness (generalized)  Other lack of coordination  Frontal lobe and executive function deficit  Unsteadiness on feet  Other disturbances of skin sensation  Attention and concentration deficit    Problem List Patient Active Problem List   Diagnosis Date Noted   Encounter for loop recorder check 01/02/2021   Loop Biotronik loop implant 12/16/2020 12/16/2020   Leukopenia    Controlled type 2 diabetes mellitus with hyperglycemia, with long-term current use of insulin (HCC)    Vascular headache    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[place]) 69/45/0388   Embolic stroke ([place]) 82/80/0349   Sinus bradycardia on ECG 11/03/2020   Benign essential HTN    Pancytopenia ([place])    Stage 3b chronic kidney disease ([place])    Anemia    Labile blood glucose    Labile blood  pressure    Diabetic peripheral neuropathy (HCC)    Essential hypertension    AKI (acute kidney injury) ([place])    Ataxia, late effect of cerebrovascular disease    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   Acute CVA (cerebrovascular accident) ([place]) 06/26/2020   CVA (cerebral vascular accident) ([place]) 06/25/2020   BMI 32.0-32.9,adult 06/03/2020    Left upper lobe pneumonia 03/30/2020   Chest pain 10/01/2019   MDD (major depressive disorder), recurrent severe, without psychosis ([place]) 05/09/2019   Suicide attempt ([place]) 05/09/2019   Cocaine abuse ([place]) 05/09/2019   Interstitial lung disease ([place]) 06/21/2016   COPD (chronic obstructive pulmonary disease) ([place]) 06/21/2016   Cough 06/21/2016   GERD (gastroesophageal reflux disease) 06/21/2016   Hypersomnia 06/21/2016   Tobacco user 06/21/2016   Rectal cancer ([place]) 01/30/2013    Alyn Jurney, OT/L 03/19/2021, 12:15 PM  [facility] [address] [place], [place], [address] Phone: [phone]   Fax:  [phone]  Name: Erica [name] MRN: [id number] Date of Birth: 1956/04/16

## 2021-03-22 NOTE — Therapy (Signed)
[facility] [address] [place], [place], [address] Phone: [phone]   Fax:  [phone]  Physical Therapy Treatment  Patient Details  Name: Erica [name] MRN: [id number] Date of Birth: 03-Dec-1955 Referring Provider (PT): [name]   Encounter Date: 03/19/2021    03/19/21 1109  PT Visits / Re-Eval  Visit Number 7  Number of Visits 17  Date for PT Re-Evaluation 04/16/21  Authorization  Authorization Type [employer] medicare  Authorization Time Period 17 visits 9/6-11/4/22  Authorization - Visit Number 7  Authorization - Number of Visits 17  Progress Note Due on Visit 10  PT Time Calculation  PT Start Time 1103  PT Stop Time 1145  PT Time Calculation (min) 42 min  PT - End of Session  Equipment Utilized During Treatment Gait belt  Activity Tolerance Patient tolerated treatment well  Behavior During Therapy [facility] for tasks assessed/performed    Past Medical History:  Diagnosis Date   Arthritis    especially in shoulders   Asthma    Depression    Diabetes mellitus    Embolic stroke ([place]) 2/54/9826   GERD (gastroesophageal reflux disease)    Headache(784.0)    "mild"   Hypertension    Loop Biotronik loop implant 12/16/2020 12/16/2020   Mental disorder    depression   Neuropathy    feet    Pain    arthritis pain - takes tramadol as needed   Rectal polyp    very little bleeding with bowel movements- no pain   Stroke ([place]) 11/03/2020   Suicide attempt [facility])     Past Surgical History:  Procedure Laterality Date   ANAL RECTAL MANOMETRY N/A 07/13/2016   Procedure: ANO RECTAL MANOMETRY;  Surgeon: Leighton Ruff, MD;  [facility];  Service: Endoscopy;  Laterality: N/A;   CESAREAN SECTION     EUS N/A 11/21/2012   Procedure: LOWER ENDOSCOPIC ULTRASOUND (EUS);  Surgeon: Arta Silence, MD;  Location: [facility];  Service: Endoscopy;  Laterality: N/A;   FLEXIBLE SIGMOIDOSCOPY N/A 11/21/2012    Procedure: FLEXIBLE SIGMOIDOSCOPY;  Surgeon: Arta Silence, MD;  [facility];  Service: Endoscopy;  Laterality: N/A;   FLEXIBLE SIGMOIDOSCOPY N/A 01/28/2013   Procedure: FLEXIBLE SIGMOIDOSCOPY;  Surgeon: Leighton Ruff, MD;  [facility];  Service: Endoscopy;  Laterality: N/A;   LAPAROSCOPIC LOW ANTERIOR RESECTION N/A 01/29/2013   Procedure: LAPAROSCOPIC LOW ANTERIOR RESECTION, Rigid Proctoscopy;  Surgeon: Leighton Ruff, MD;  [facility]: [facility];  Service: General;  Laterality: N/A;   LAPAROSCOPIC SIGMOID COLECTOMY N/A 11/14/2012   Procedure: DIAGNOSTIC LAPAROSCOPY AND SIGMOIDMOIDOSCOPY ;  Surgeon: Rolm Bookbinder, MD;  [facility];  Service: General;  Laterality: N/A;   RECTAL ULTRASOUND N/A 07/13/2016   Procedure: RECTAL ULTRASOUND;  Surgeon: Leighton Ruff, MD;  [facility];  Service: Endoscopy;  Laterality: N/A;   TONSILLECTOMY     TONSILLECTOMY AND ADENOIDECTOMY      There were no vitals filed for this visit.    03/19/21 1107  Symptoms/Limitations  Subjective No new complaints. No falls. Is having some issues with her left shoulder due to RC injury. Using RW to arrive at session.  Pertinent History arthritis, asthma, depression, HTN, neuropathy, suicide attempt, DM, CVA  Patient Stated Goals Pt would like to be able to walk and use her hands better.  Pain Assessment  Currently in Pain? Yes  Pain Location Shoulder  Pain Orientation Left  Pain Descriptors / Indicators Aching  Pain Type Acute pain;Chronic pain  Pain Onset 1 to 4 weeks ago  Pain Frequency Constant  Aggravating Factors  lifting arm, overuse  Pain Relieving Factors rest      03/19/21 1110  Transfers  Transfers Sit to Stand;Stand to Sit  Sit to Stand 6: Modified independent (Device/Increase time)  Stand to Sit 6: Modified independent (Device/Increase time)  Ambulation/Gait  Ambulation/Gait Yes  Ambulation/Gait Assistance 5: Supervision  Ambulation/Gait Assistance Details pt with  mild shortness of breath after gait with cane, recovered with rest break. cues with gait for heel strike on left LE, to relax left UE for normal arm swing and increased base of support/cane placement so not to trip over cane. pt needed 2 brief standing breaks with 2cd rep of gait due to fatigue, mild shortness of breath. Recoverd quickly with seated rest break at mat table.  Ambulation Distance (Feet) 230 Feet (x 2) with cane, use of walker to enter/exit session  Assistive device Rolling walker;Straight cane (cane with rubber quad tip)  Gait Pattern Step-through pattern;Decreased stance time - left;Decreased step length - right;Decreased hip/knee flexion - left;Narrow base of support  Ambulation Surface Level;Indoor  Neuro Re-ed   Neuro Re-ed Details  for strengthening/left LE weight bearing/muscle re-ed: seated at edge of mat in staggered stance with left foot back/right foot forward for sit<>stands x 10 reps with no UE support; then with right foot on foam bubble/left foot on solid ground- sit<>stands 2 sets of 5 reps with cues/facilitation for weight shifitng, tall standing and controlled descent.  Knee/Hip Exercises: Aerobic  Nustep UE/LE's on level 3.0 x 6 minutes with goal >/= 60 steps per minute for strengthening and activity tolerance.            PT Short Term Goals - 03/17/21 1410       PT SHORT TERM GOAL #1   Title Pt will be independent with initial strengthening and balance HEP.    Baseline Pt reports exercises going pretty good.    Time 4    Period Weeks    Status Achieved    Target Date 03/17/21      PT SHORT TERM GOAL #2   Title Pt will decrease 5 x sit to stand from 28.59 sec to <20 sec for improved balance and functional mobility.    Baseline 02/16/21 28.59 sec. 03/17/21 16.63 sec    Time 4    Period Weeks    Status Achieved    Target Date 03/17/21      PT SHORT TERM GOAL #3   Title Pt will increase Berg from 38 to 42/56 for improved balance and decreased falls  risk.    Baseline 02/16/21 38/56. 03/17/21 44/56    Time 4    Period Weeks    Status Achieved    Target Date 03/17/21      PT SHORT TERM GOAL #4   Title Pt will ambulate >500' with RW on level surfaces mod I for improved household and short community distances.    Baseline 03/17/21 230' with RW supervision    Time 4    Period Weeks    Status Not Met    Target Date 03/17/21               PT Long Term Goals - 02/17/21 0827       PT LONG TERM GOAL #1   Title Pt will be independent with progressive HEP for strengthening and balance to continue gains on own.    Time 8    Period  Weeks    Status New    Target Date 04/16/21      PT LONG TERM GOAL #2   Title Pt will increase gait speed to >0.50ms for improved gait safety.    Baseline 02/16/21 0.384m    Time 8    Period Weeks    Status New    Target Date 04/16/21      PT LONG TERM GOAL #3   Title Pt will increase Berg Balance to >45/56 for improved balance and decreased fall risk.    Baseline 02/16/21 38/56    Time 8    Period Weeks    Status New    Target Date 04/16/21      PT LONG TERM GOAL #4   Title Pt will ambulate >800' on varied surfaces with LRAD mod I for improved short community distances.    Time 8    Period Weeks    Status New    Target Date 04/16/21      PT LONG TERM GOAL #5   Title Pt will ambulate up/down 4 steps with 1 rail mod I to safely enter/exit home.    Time 8    Period Weeks    Status New    Target Date 04/16/21      Additional Long Term Goals   Additional Long Term Goals Yes      PT LONG TERM GOAL #6   Title Pt will increase FOTO from 50 to 60.    Baseline 02/16/21 50    Time 8    Period Weeks    Status New    Target Date 04/16/21               03/19/21 0001  Plan  Clinical Impression Statement Today's skilled session continued to focus on strengthening and gait training with cane. Rest breaks taken as needed due to fatigue and shortness of breath. Pt recoverd quickly. No other  issues noted or reproted in session. The pt is progressing toward goals and should benefit from continued PT to progress toward unmet goals.  Personal Factors and Comorbidities Comorbidity 3+  Comorbidities arthritis, asthma, depression, HTN, neuropathy, suicide attempt, DM  Examination-Activity Limitations Locomotion Level;Transfers;Stand;Stairs;Squat  Examination-Participation Restrictions Community Activity;Driving;Yard Work;Cleaning  Pt will benefit from skilled therapeutic intervention in order to improve on the following deficits Abnormal gait;Decreased balance;Decreased activity tolerance;Decreased mobility;Decreased strength;Decreased knowledge of use of DME;Decreased endurance;Impaired sensation  Stability/Clinical Decision Making Evolving/Moderate complexity  Rehab Potential Good  PT Frequency 2x / week (plus eval)  PT Duration 8 weeks  PT Treatment/Interventions ADLs/Self Care Home Management;DME Instruction;Neuromuscular re-education;Manual techniques;Balance training;Therapeutic exercise;Therapeutic activities;Functional mobility training;Stair training;Gait training;Vestibular;Passive range of motion  PT Next Visit Plan Continue LLE strengthening and balance training. Gait training with walker as well as with cane with quad tip. Step-ups with LLE working on control.  Consulted and Agree with Plan of Care Patient         Patient will benefit from skilled therapeutic intervention in order to improve the following deficits and impairments:  Abnormal gait, Decreased balance, Decreased activity tolerance, Decreased mobility, Decreased strength, Decreased knowledge of use of DME, Decreased endurance, Impaired sensation  Visit Diagnosis: Muscle weakness (generalized)  Unsteadiness on feet  Other abnormalities of gait and mobility     Problem List Patient Active Problem List   Diagnosis Date Noted   Encounter for loop recorder check 01/02/2021   Loop Biotronik loop implant  12/16/2020 12/16/2020   Leukopenia    Controlled type 2 diabetes  mellitus with hyperglycemia, with long-term current use of insulin (HCC)    Vascular headache    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[place]) 74/94/4967   Embolic stroke ([place]) 59/16/3846   Sinus bradycardia on ECG 11/03/2020   Benign essential HTN    Pancytopenia (HCC)    Stage 3b chronic kidney disease ([place])    Anemia    Labile blood glucose    Labile blood pressure    Diabetic peripheral neuropathy ([place])    Essential hypertension    AKI (acute kidney injury) ([place])    Ataxia, late effect of cerebrovascular disease    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[place]) 06/30/2020   Acute CVA (cerebrovascular accident) ([place]) 06/26/2020   CVA (cerebral vascular accident) ([place]) 06/25/2020   BMI 32.0-32.9,adult 06/03/2020   Left upper lobe pneumonia 03/30/2020   Chest pain 10/01/2019   MDD (major depressive disorder), recurrent severe, without psychosis ([place]) 05/09/2019   Suicide attempt ([place]) 05/09/2019   Cocaine abuse ([place]) 05/09/2019   Interstitial lung disease ([place]) 06/21/2016   COPD (chronic obstructive pulmonary disease) ([place]) 06/21/2016   Cough 06/21/2016   GERD (gastroesophageal reflux disease) 06/21/2016   Hypersomnia 06/21/2016   Tobacco user 06/21/2016   Rectal cancer ([place]) 01/30/2013    Willow Ora, PTA 03/22/2021, 12:01 AM  [facility] [address] [place], [place], [address] Phone: [phone]   Fax:  [phone]  Name: AZELYN BATIE MRN: [id number] Date of Birth: 1955/12/07

## 2021-03-23 ENCOUNTER — Encounter: Payer: Self-pay | Admitting: Physical Therapy

## 2021-03-23 ENCOUNTER — Other Ambulatory Visit: Payer: Self-pay

## 2021-03-23 ENCOUNTER — Ambulatory Visit: Payer: Medicare HMO | Admitting: Occupational Therapy

## 2021-03-23 ENCOUNTER — Encounter: Payer: Self-pay | Admitting: Occupational Therapy

## 2021-03-23 ENCOUNTER — Ambulatory Visit: Payer: Medicare HMO | Admitting: Physical Therapy

## 2021-03-23 ENCOUNTER — Ambulatory Visit: Payer: Medicare HMO | Admitting: Cardiology

## 2021-03-23 DIAGNOSIS — M6281 Muscle weakness (generalized): Secondary | ICD-10-CM

## 2021-03-23 DIAGNOSIS — R41840 Attention and concentration deficit: Secondary | ICD-10-CM

## 2021-03-23 DIAGNOSIS — R278 Other lack of coordination: Secondary | ICD-10-CM

## 2021-03-23 DIAGNOSIS — R2681 Unsteadiness on feet: Secondary | ICD-10-CM

## 2021-03-23 DIAGNOSIS — R208 Other disturbances of skin sensation: Secondary | ICD-10-CM

## 2021-03-23 DIAGNOSIS — R2689 Other abnormalities of gait and mobility: Secondary | ICD-10-CM

## 2021-03-23 DIAGNOSIS — R41844 Frontal lobe and executive function deficit: Secondary | ICD-10-CM

## 2021-03-23 NOTE — Therapy (Signed)
[facility] [address] [place], [place], [address] Phone: [phone]   Fax:  [phone]  Physical Therapy Treatment  Patient Details  Name: Erica [name] MRN: [id number] Date of Birth: 1955/12/01 Referring Provider (PT): [name]   Encounter Date: 03/23/2021   PT End of Session - 03/23/21 1235     Visit Number 8    Number of Visits 17    Date for PT Re-Evaluation 04/16/21    Authorization Type [employer] medicare    Authorization Time Period 17 visits 9/6-11/4/22    Authorization - Visit Number 8    Authorization - Number of Visits 17    Progress Note Due on Visit 10    PT Start Time 1232    PT Stop Time 1311    PT Time Calculation (min) 39 min    Equipment Utilized During Treatment Gait belt    Activity Tolerance Patient tolerated treatment well    Behavior During Therapy [facility] for tasks assessed/performed             Past Medical History:  Diagnosis Date   Arthritis    especially in shoulders   Asthma    Depression    Diabetes mellitus    Embolic stroke ([place]) 1/60/1093   GERD (gastroesophageal reflux disease)    Headache(784.0)    "mild"   Hypertension    Loop Biotronik loop implant 12/16/2020 12/16/2020   Mental disorder    depression   Neuropathy    feet    Pain    arthritis pain - takes tramadol as needed   Rectal polyp    very little bleeding with bowel movements- no pain   Stroke ([place]) 11/03/2020   Suicide attempt [facility])     Past Surgical History:  Procedure Laterality Date   ANAL RECTAL MANOMETRY N/A 07/13/2016   Procedure: ANO RECTAL MANOMETRY;  Surgeon: Leighton Ruff, MD;  [facility];  Service: Endoscopy;  Laterality: N/A;   CESAREAN SECTION     EUS N/A 11/21/2012   Procedure: LOWER ENDOSCOPIC ULTRASOUND (EUS);  Surgeon: Arta Silence, MD;  Location: [facility];  Service: Endoscopy;  Laterality: N/A;   FLEXIBLE SIGMOIDOSCOPY N/A 11/21/2012   Procedure: FLEXIBLE  SIGMOIDOSCOPY;  Surgeon: Arta Silence, MD;  [facility];  Service: Endoscopy;  Laterality: N/A;   FLEXIBLE SIGMOIDOSCOPY N/A 01/28/2013   Procedure: FLEXIBLE SIGMOIDOSCOPY;  Surgeon: Leighton Ruff, MD;  [facility];  Service: Endoscopy;  Laterality: N/A;   LAPAROSCOPIC LOW ANTERIOR RESECTION N/A 01/29/2013   Procedure: LAPAROSCOPIC LOW ANTERIOR RESECTION, Rigid Proctoscopy;  Surgeon: Leighton Ruff, MD;  [facility];  Service: General;  Laterality: N/A;   LAPAROSCOPIC SIGMOID COLECTOMY N/A 11/14/2012   Procedure: DIAGNOSTIC LAPAROSCOPY AND SIGMOIDMOIDOSCOPY ;  Surgeon: Rolm Bookbinder, MD;  [facility];  Service: General;  Laterality: N/A;   RECTAL ULTRASOUND N/A 07/13/2016   Procedure: RECTAL ULTRASOUND;  Surgeon: Leighton Ruff, MD;  [facility];  Service: Endoscopy;  Laterality: N/A;   TONSILLECTOMY     TONSILLECTOMY AND ADENOIDECTOMY      There were no vitals filed for this visit.   Subjective Assessment - 03/23/21 1234     Subjective No falls. Going to the orthopedic doctor on Thursday.    Pertinent History arthritis, asthma, depression, HTN, neuropathy, suicide attempt, DM, CVA    Patient Stated Goals Pt would like to be able to walk and use her hands better.    Currently in Pain? Yes    Pain  Score 6     Pain Location Shoulder    Pain Orientation Left    Pain Descriptors / Indicators Aching    Pain Type Acute pain    Pain Onset 1 to 4 weeks ago    Aggravating Factors  picking up stuff    Pain Relieving Factors nothing                               [facility] Adult PT Treatment/Exercise - 03/23/21 1237       Transfers   Transfers Sit to Stand;Stand to Sit    Sit to Stand 5: Supervision    Stand to Sit 5: Supervision    Comments Staggered stance sit <> stand with LLE posteriorly for incr weight bearing x10 reps on level ground, x5 reps on air ex - no UE support      Ambulation/Gait   Ambulation/Gait Yes     Ambulation/Gait Assistance 5: Supervision    Ambulation/Gait Assistance Details Working on gait with cane with cues for placement and LLE heel strike. Pt's LLE more fatigued during 2nd lap. Mild SOB after with pt needing a brief seated rest break. HR after 86 bpm, O2 96%    Ambulation Distance (Feet) 230 Feet   x1   Assistive device Rolling walker;Straight cane   Cane with rubber quad tip   Gait Pattern Step-through pattern;Decreased stance time - left;Decreased step length - right;Decreased hip/knee flexion - left;Narrow base of support    Ambulation Surface Level;Indoor      Knee/Hip Exercises: Standing   Forward Step Up Left;1 set;10 reps;Hand Hold: 1;Hand Hold: 2;Step Height: 6"    Forward Step Up Limitations Leading with LLE, cues for knee extension                 Balance Exercises - 03/23/21 1254       Balance Exercises: Standing   SLS Eyes open;Solid surface;Limitations    SLS Limitations Alternating SLS taps to 6" step working on decr UE support, cues for quad/glute activation on LLE, x10 reps    Rockerboard Anterior/posterior;Lateral;Limitations    Rockerboard Limitations Lateral: x12 reps bilat weight shifting with focus on slowed and controlled with 3 second holds to each side with no UE support, holding board still x30 seconds. A/P: weight shifting x12 reps - incr difficulty going forward onto toes. Holding board still 2 x 5 reps head turns needing UE support, tendency to lose balance posteriorly    Other Standing Exercises with BUE support: x10 reps heel toe raises with cues for knee extension with LLE                  PT Short Term Goals - 03/17/21 1410       PT SHORT TERM GOAL #1   Title Pt will be independent with initial strengthening and balance HEP.    Baseline Pt reports exercises going pretty good.    Time 4    Period Weeks    Status Achieved    Target Date 03/17/21      PT SHORT TERM GOAL #2   Title Pt will decrease 5 x sit to stand from  28.59 sec to <20 sec for improved balance and functional mobility.    Baseline 02/16/21 28.59 sec. 03/17/21 16.63 sec    Time 4    Period Weeks    Status Achieved    Target Date 03/17/21  PT SHORT TERM GOAL #3   Title Pt will increase Berg from 38 to 42/56 for improved balance and decreased falls risk.    Baseline 02/16/21 38/56. 03/17/21 44/56    Time 4    Period Weeks    Status Achieved    Target Date 03/17/21      PT SHORT TERM GOAL #4   Title Pt will ambulate >500' with RW on level surfaces mod I for improved household and short community distances.    Baseline 03/17/21 230' with RW supervision    Time 4    Period Weeks    Status Not Met    Target Date 03/17/21               PT Long Term Goals - 02/17/21 0827       PT LONG TERM GOAL #1   Title Pt will be independent with progressive HEP for strengthening and balance to continue gains on own.    Time 8    Period Weeks    Status New    Target Date 04/16/21      PT LONG TERM GOAL #2   Title Pt will increase gait speed to >0.3ms for improved gait safety.    Baseline 02/16/21 0.366m    Time 8    Period Weeks    Status New    Target Date 04/16/21      PT LONG TERM GOAL #3   Title Pt will increase Berg Balance to >45/56 for improved balance and decreased fall risk.    Baseline 02/16/21 38/56    Time 8    Period Weeks    Status New    Target Date 04/16/21      PT LONG TERM GOAL #4   Title Pt will ambulate >800' on varied surfaces with LRAD mod I for improved short community distances.    Time 8    Period Weeks    Status New    Target Date 04/16/21      PT LONG TERM GOAL #5   Title Pt will ambulate up/down 4 steps with 1 rail mod I to safely enter/exit home.    Time 8    Period Weeks    Status New    Target Date 04/16/21      Additional Long Term Goals   Additional Long Term Goals Yes      PT LONG TERM GOAL #6   Title Pt will increase FOTO from 50 to 60.    Baseline 02/16/21 50    Time 8    Period  Weeks    Status New    Target Date 04/16/21                   Plan - 03/23/21 1315     Clinical Impression Statement Continued to work on gait with SPC and quad tip, LLE strengthening, and standing balance strategies. When pt is more fatigued with LLE, pt with decr L ankle DF control. Pt needing intermittent seated rest breaks during session due to fatigue. Will continue to progress towards LTGs.    Personal Factors and Comorbidities Comorbidity 3+    Comorbidities arthritis, asthma, depression, HTN, neuropathy, suicide attempt, DM    Examination-Activity Limitations Locomotion Level;Transfers;Stand;Stairs;Squat    Examination-Participation Restrictions Community Activity;Driving;Yard Work;Cleaning    Stability/Clinical Decision Making Evolving/Moderate complexity    Rehab Potential Good    PT Frequency 2x / week   plus eval   PT Duration 8 weeks  PT Treatment/Interventions ADLs/Self Care Home Management;DME Instruction;Neuromuscular re-education;Manual techniques;Balance training;Therapeutic exercise;Therapeutic activities;Functional mobility training;Stair training;Gait training;Vestibular;Passive range of motion    PT Next Visit Plan Continue LLE strengthening and balance training. Gait training with walker as well as with cane with quad tip. Step-ups with LLE working on control.    Consulted and Agree with Plan of Care Patient             Patient will benefit from skilled therapeutic intervention in order to improve the following deficits and impairments:  Abnormal gait, Decreased balance, Decreased activity tolerance, Decreased mobility, Decreased strength, Decreased knowledge of use of DME, Decreased endurance, Impaired sensation  Visit Diagnosis: Muscle weakness (generalized)  Unsteadiness on feet  Other abnormalities of gait and mobility  Other lack of coordination     Problem List Patient Active Problem List   Diagnosis Date Noted   Encounter for loop  recorder check 01/02/2021   Loop Biotronik loop implant 12/16/2020 12/16/2020   Leukopenia    Controlled type 2 diabetes mellitus with hyperglycemia, with long-term current use of insulin (HCC)    Vascular headache    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[place]) 35/52/1747   Embolic stroke ([place]) 15/95/3967   Sinus bradycardia on ECG 11/03/2020   Benign essential HTN    Pancytopenia ([place])    Stage 3b chronic kidney disease ([place])    Anemia    Labile blood glucose    Labile blood pressure    Diabetic peripheral neuropathy ([place])    Essential hypertension    AKI (acute kidney injury) ([place])    Ataxia, late effect of cerebrovascular disease    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[place]) 06/30/2020   Acute CVA (cerebrovascular accident) ([place]) 06/26/2020   CVA (cerebral vascular accident) ([place]) 06/25/2020   BMI 32.0-32.9,adult 06/03/2020   Left upper lobe pneumonia 03/30/2020   Chest pain 10/01/2019   MDD (major depressive disorder), recurrent severe, without psychosis ([place]) 05/09/2019   Suicide attempt ([place]) 05/09/2019   Cocaine abuse ([place]) 05/09/2019   Interstitial lung disease ([place]) 06/21/2016   COPD (chronic obstructive pulmonary disease) ([place]) 06/21/2016   Cough 06/21/2016   GERD (gastroesophageal reflux disease) 06/21/2016   Hypersomnia 06/21/2016   Tobacco user 06/21/2016   Rectal cancer ([place]) 01/30/2013    Arliss Journey, PT, DPT  03/23/2021, 1:16 PM  [facility] [address] [place], [place], [address] Phone: [phone]   Fax:  [phone]  Name: Erica [name] MRN: [id number] Date of Birth: 1956-06-13

## 2021-03-23 NOTE — Therapy (Signed)
[place], [place], [address] Phone: [phone]   Fax:  [phone]  Occupational Therapy Treatment/ STG check Pt has met 4/6 STGs. See Below.  Patient Details  Name: Erica [name] MRN: [id number] Date of Birth: July 25, 1955 Referring Provider (OT): Dr. Letta Pate   Encounter Date: 03/23/2021   OT End of Session - 03/23/21 1148     Visit Number 8    Number of Visits 25    Date for OT Re-Evaluation 05/19/21    Authorization Type Medicare/ Medicaid    Authorization - Visit Number 8    Progress Note Due on Visit 10    OT Start Time 3007    OT Stop Time 1227    OT Time Calculation (min) 42 min    Activity Tolerance Patient tolerated treatment well    Behavior During Therapy [facility] for tasks assessed/performed             Past Medical History:  Diagnosis Date   Arthritis    especially in shoulders   Asthma    Depression    Diabetes mellitus    Embolic stroke ([place]) 12/03/6331   GERD (gastroesophageal reflux disease)    Headache(784.0)    "mild"   Hypertension    Loop Biotronik loop implant 12/16/2020 12/16/2020   Mental disorder    depression   Neuropathy    feet    Pain    arthritis pain - takes tramadol as needed   Rectal polyp    very little bleeding with bowel movements- no pain   Stroke ([place]) 11/03/2020   Suicide attempt [facility])     Past Surgical History:  Procedure Laterality Date   ANAL RECTAL MANOMETRY N/A 07/13/2016   Procedure: ANO RECTAL MANOMETRY;  Surgeon: Leighton Ruff, MD;  Location: [facility] ENDOSCOPY;  Service: Endoscopy;  Laterality: N/A;   CESAREAN SECTION     EUS N/A 11/21/2012   Procedure: LOWER ENDOSCOPIC ULTRASOUND (EUS);  Surgeon: Arta Silence, MD;  Location: [facility];  Service: Endoscopy;  Laterality: N/A;   FLEXIBLE SIGMOIDOSCOPY N/A 11/21/2012   Procedure: FLEXIBLE SIGMOIDOSCOPY;  Surgeon: Arta Silence, MD;  Location: [facility] ENDOS[facility];  Service: Endoscopy;   Laterality: N/A;   FLEXIBLE SIGMOIDOSCOPY N/A 01/28/2013   Procedure: FLEXIBLE SIGMOIDOSCOPY;  Surgeon: Leighton Ruff, MD;  Location: [facility];  Service: Endoscopy;  Laterality: N/A;   LAPAROSCOPIC LOW ANTERIOR RESECTION N/A 01/29/2013   Procedure: LAPAROSCOPIC LOW ANTERIOR RESECTION, Rigid Proctoscopy;  Surgeon: Leighton Ruff, MD;  Location: [facility] ORS;  Service: General;  Laterality: N/A;   LAPAROSCOPIC SIGMOID COLECTOMY N/A 11/14/2012   Procedure: DIAGNOSTIC LAPAROSCOPY AND SIGMOIDMOIDOSCOPY ;  Surgeon: Rolm Bookbinder, MD;  Location: [facility];  Service: General;  Laterality: N/A;   RECTAL ULTRASOUND N/A 07/13/2016   Procedure: RECTAL ULTRASOUND;  Surgeon: Leighton Ruff, MD;  Location: [facility] ENDOS[facility];  Service: Endoscopy;  Laterality: N/A;   TONSILLECTOMY     TONSILLECTOMY AND ADENOIDECTOMY      There were no vitals filed for this visit.   Subjective Assessment - 03/23/21 1146     Subjective  Pt reports "ok" and reports some shoulder pain. "before my stroke they set me up for therapy - last time i had a cortisone shot i had the stroke 2 days later"    Pertinent History Referred by [name] 01/21/21 , post-stroke  History of right parieto-occipital infarct. Most recent stroke 11/03/20. PMH: arthritis, asthma, depression, HTN, neuropathy, suicide attempt, DM    Patient Stated Goals do  more stuff around the house    Currently in Pain? Yes    Pain Score 6     Pain Location Shoulder    Pain Orientation Left    Pain Descriptors / Indicators Aching    Pain Type Acute pain    Pain Onset 1 to 4 weeks ago    Pain Frequency Constant    Aggravating Factors  overuse    Pain Relieving Factors rest               ADLs/IADLs- washing vegetables and lifting pans during cooking with a dinner she had. Pt reports doing some cooking sometimes but her son does it, too. Pt reports doing her own laundry with no difficulty and washing dishes. Pt reports using w/c to transfer clothes for laundry.    Discussed proper movement pattern for shoulder flexion vs compensatory movements with shoulder hiking and abduction  Resistance Clothespins red, green and blue with LUE with emphasis on proper and normal movement patterns and decreasing abduction with reach.  Supine flex/ext, horizontal abduction and chest press x 10 reps in mid ranges.  Physioball  x 10 reps each with forward reach and horizontal abduction                  OT Short Term Goals - 03/23/21 1158       OT SHORT TERM GOAL #1   Title I with inital  HEP- 03/24/21    Time 4    Period Weeks    Status Achieved    Target Date 03/24/21      OT SHORT TERM GOAL #2   Title I with sensory precautions    Time 4    Period Weeks    Status On-going   pt  verbalized understanding of precautions 03/23/21     OT SHORT TERM GOAL #3   Title Pt will perform basic home mangement with supervision    Time 4    Period Weeks    Status Achieved   pt reports doing laundry consistently from w/c level in apt 03/23/21     OT SHORT TERM GOAL #4   Title Pt will perform basic cooking with supervision demonstrating good safety awareness.    Time 4    Period Weeks    Status On-going   03/17/21, supervision and min v.c for walker safety     OT SHORT TERM GOAL #5   Title Pt will perform all basic ADLS modified independently.    Time 4    Period Weeks    Status Achieved   goes to beauty shop to get hair shampooed 03/10/21     OT SHORT TERM GOAL #6   Title Pt will retrieve a lightweight object at 115 shoulder flexion with LUE without dropping    Baseline 105*    Time 4    Period Weeks    Status Achieved   min v/c for decreasing abduction 03/23/21              OT Long Term Goals - 02/24/21 1158       OT LONG TERM GOAL #1   Title I with updated HEP. 05/19/21    Time 12    Period Weeks    Status New    Target Date 05/19/21      OT LONG TERM GOAL #2   Title Pt will demonstrate improved fine motor coordination for  ADLs as evidenced  by decreasing LUE 9 hole peg test to 28  secs without drops.    Time 12    Period Weeks    Status New      OT LONG TERM GOAL #3   Title Pt will demonstrate ability to retrieve a 2 lbs object from overhead shelf without drops with LUE.    Time 12    Period Weeks    Status New      OT LONG TERM GOAL #4   Title Pt will perform all basic ADLs modified Indpendently.    Time 12    Period Weeks    Status New      OT LONG TERM GOAL #5   Title Pt will perfom basic home management/ cooking mod I with good safety awareness.    Time 12    Period Weeks    Status New      Long Term Additional Goals   Additional Long Term Goals Yes      OT LONG TERM GOAL #6   Title I with memory compensation strategies.    Time 12    Period Weeks    Status New                   Plan - 03/23/21 1225     Clinical Impression Statement Pt continues with shoulder pain from RTC - doc visit on thursday. pt tolerating exercises well.    OT Occupational Profile and History Detailed Assessment- Review of Records and additional review of physical, cognitive, psychosocial history related to current functional performance    Occupational performance deficits (Please refer to evaluation for details): ADL's;IADL's;Leisure    Body Structure / Function / Physical Skills ADL;Endurance;UE functional use;Balance;Flexibility;FMC;Gait;ROM;GMC;Coordination;Decreased knowledge of precautions;Sensation;IADL;Decreased knowledge of use of DME;Dexterity;Mobility    Cognitive Skills Attention;Memory    Rehab Potential Good    Clinical Decision Making Limited treatment options, no task modification necessary    Comorbidities Affecting Occupational Performance: May have comorbidities impacting occupational performance    Modification or Assistance to Complete Evaluation  No modification of tasks or assist necessary to complete eval    OT Frequency 2x / week   plus eval, will likely d/c after 8 weeks  dependent on progress   OT Duration 12 weeks    OT Treatment/Interventions Self-care/ADL training;Ultrasound;Patient/family education;DME and/or AE instruction;Gait Training;Passive range of motion;Balance training;Fluidtherapy;Cryotherapy;Electrical Stimulation;Therapist, nutritional;Therapeutic activities;Manual Therapy;Therapeutic exercise;Cognitive remediation/compensation;Neuromuscular education    Plan monitor shoulder pain,(hx of rotator cuff tear) limit overhead reach, simple home management tasks with emphasis on walker safety. Follow MD recommendations regarding shoulder    Consulted and Agree with Plan of Care Patient             Patient will benefit from skilled therapeutic intervention in order to improve the following deficits and impairments:   Body Structure / Function / Physical Skills: ADL, Endurance, UE functional use, Balance, Flexibility, FMC, Gait, ROM, GMC, Coordination, Decreased knowledge of precautions, Sensation, IADL, Decreased knowledge of use of DME, Dexterity, Mobility Cognitive Skills: Attention, Memory     Visit Diagnosis: Muscle weakness (generalized)  Other lack of coordination  Frontal lobe and executive function deficit  Unsteadiness on feet  Other disturbances of skin sensation  Attention and concentration deficit  Other abnormalities of gait and mobility    Problem List Patient Active Problem List   Diagnosis Date Noted   Encounter for loop recorder check 01/02/2021   Loop Biotronik loop implant 12/16/2020 12/16/2020   Leukopenia    Controlled type 2 diabetes mellitus with hyperglycemia, with long-term current use  of insulin ([place])    Vascular headache    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[place]) 87/37/3081   Embolic stroke ([place]) 68/38/7065   Sinus bradycardia on ECG 11/03/2020   Benign essential HTN    Pancytopenia ([facility])    Stage 3b chronic kidney disease ([place])    Anemia    Labile blood glucose    Labile blood pressure    Diabetic  peripheral neuropathy ([place])    Essential hypertension    [name] (acute kidney injury) ([place])    Ataxia, late effect of cerebrovascular disease    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[place]) 06/30/2020   Acute CVA (cerebrovascular accident) ([place]) 06/26/2020   CVA (cerebral vascular accident) ([place]) 06/25/2020   BMI 32.0-32.9,adult 06/03/2020   Left upper lobe pneumonia 03/30/2020   Chest pain 10/01/2019   MDD (major depressive disorder), recurrent severe, without psychosis ([place]) 05/09/2019   Suicide attempt ([place]) 05/09/2019   Cocaine abuse ([place]) 05/09/2019   Interstitial lung disease ([place]) 06/21/2016   COPD (chronic obstructive pulmonary disease) ([place]) 06/21/2016   Cough 06/21/2016   GERD (gastroesophageal reflux disease) 06/21/2016   Hypersomnia 06/21/2016   Tobacco user 06/21/2016   Rectal cancer ([place]) 01/30/2013    Zachery Conch, OT/L 03/23/2021, 12:59 PM  [place] [address] [place], [place], [address] Phone: [phone]   Fax:  [phone]  Name: GIRTIE WIERSMA MRN: [id number] Date of Birth: 1955-10-26

## 2021-03-25 ENCOUNTER — Encounter: Payer: Self-pay | Admitting: Cardiology

## 2021-03-25 ENCOUNTER — Other Ambulatory Visit: Payer: Self-pay

## 2021-03-25 ENCOUNTER — Ambulatory Visit: Payer: Medicare HMO | Admitting: Cardiology

## 2021-03-25 VITALS — BP 135/82 | HR 77 | Temp 97.8°F | Resp 16 | Ht 63.0 in | Wt 184.0 lb

## 2021-03-25 DIAGNOSIS — I2584 Coronary atherosclerosis due to calcified coronary lesion: Secondary | ICD-10-CM

## 2021-03-25 DIAGNOSIS — N1832 Chronic kidney disease, stage 3b: Secondary | ICD-10-CM

## 2021-03-25 DIAGNOSIS — E1122 Type 2 diabetes mellitus with diabetic chronic kidney disease: Secondary | ICD-10-CM

## 2021-03-25 DIAGNOSIS — Z8673 Personal history of transient ischemic attack (TIA), and cerebral infarction without residual deficits: Secondary | ICD-10-CM

## 2021-03-25 DIAGNOSIS — E875 Hyperkalemia: Secondary | ICD-10-CM

## 2021-03-25 DIAGNOSIS — Z95818 Presence of other cardiac implants and grafts: Secondary | ICD-10-CM

## 2021-03-25 DIAGNOSIS — I129 Hypertensive chronic kidney disease with stage 1 through stage 4 chronic kidney disease, or unspecified chronic kidney disease: Secondary | ICD-10-CM

## 2021-03-25 DIAGNOSIS — Z794 Long term (current) use of insulin: Secondary | ICD-10-CM

## 2021-03-25 DIAGNOSIS — E781 Pure hyperglyceridemia: Secondary | ICD-10-CM

## 2021-03-25 DIAGNOSIS — I2510 Atherosclerotic heart disease of native coronary artery without angina pectoris: Secondary | ICD-10-CM

## 2021-03-25 NOTE — Progress Notes (Signed)
Erica [name] Date of Birth: [birth year] MRN: [id number] Primary Care Provider:Avbuere, Christean Grief, MD Former Cardiology Providers: Dr. Adrian Prows, Jeri Lager, APRN, FNP-C Primary Cardiologist: Rex Kras, DO, [facility] (established care 11/19/2019)  Date: 03/25/21 Last Office Visit: 01/07/2021  Chief Complaint  Patient presents with   Follow-up    3 month Loop Biotronik loop implant 12/16/2020    HPI   Erica [name] is a 65 y.o.  female who presents to the office with a chief complaint of " 3 month follow up." Patient's past medical history and cardiovascular risk factors include: Hx of stroke involving the right caudate nucleus and right anterolateral thalamus (Jan 2022), Hx of  right parietooccipital stroke (May 2022), rectal CA in 2014 that is in remission, T2DM, former tobacco use, emphysema, interstitial lung disease, idiopathic pulmonary fibrosis, and coronary artery calcification on CT scan.    Patient has had 2 strokes since January 2022 and due to 2 different vascular territories involved neurology team was concerned that she may have had an embolic source.  She was recommended to have a loop recorder implanted prior to discharge.  However, patient chose to do this as outpatient after discussing the risks, benefits, and limitations.  She underwent loop recorder implantation in July 2022 and has done well since then.  Patient has been stroke free since last office visit.  She has very residual focal neurological deficits.  And ambulates with a walker.  She denies any hospitalizations or urgent care visits for cardiovascular symptoms.  Most labs from 03/08/2021 independently reviewed which noted significant improvement in her triglyceride levels.  Her LDL is currently at goal.  Patient was started on Tricor and since then has tolerated the medication well without any side effects or intolerances.  ALLERGIES: Allergies  Allergen Reactions   Penicillins Hives, Itching and Swelling    Tongue  swells up Has patient had a PCN reaction causing immediate rash, facial/tongue/throat swelling, SOB or lightheadedness with hypotension: Yes Has patient had a PCN reaction causing severe rash involving mucus membranes or skin necrosis: Yes Has patient had a PCN reaction that required hospitalization Yes Has patient had a PCN reaction occurring within the last 10 years: No If all of the above answers are "NO", then may proceed with Cephalosporin use.      MEDICATION LIST PRIOR TO VISIT: Current Outpatient Medications on File Prior to Visit  Medication Sig Dispense Refill   acetaminophen (TYLENOL) 325 MG tablet Take 2 tablets (650 mg total) by mouth every 4 (four) hours as needed for mild pain (or temp > 37.5 C (99.5 F)).     albuterol (VENTOLIN HFA) 108 (90 Base) MCG/ACT inhaler Inhale 2 puffs into the lungs every 4 (four) hours as needed for wheezing. 1 g 5   amLODipine (NORVASC) 10 MG tablet Take 1 tablet (10 mg total) by mouth daily. 30 tablet 2   aspirin EC 81 MG EC tablet Take 1 tablet (81 mg total) by mouth daily. Swallow whole. 30 tablet 11   busPIRone (BUSPAR) 10 MG tablet Take 1 tablet (10 mg total) by mouth 2 (two) times daily. 60 tablet 0   diclofenac Sodium (VOLTAREN) 1 % GEL Apply 2 g topically daily as needed (Knee pain). 2 g 0   famotidine (PEPCID) 20 MG tablet Take 1 tablet (20 mg total) by mouth daily. 30 tablet 0   gabapentin (NEURONTIN) 100 MG capsule Take 2 capsules (200 mg total) by mouth 2 (two) times daily as needed (pain). 60 capsule  0   insulin glargine (LANTUS SOLOSTAR) 100 UNIT/ML Solostar Pen Please provide 24 units every a.m. and 20 units every p.m. 15 mL 11   Insulin Pen Needle (PEN NEEDLES) 32G X 6 MM MISC 1 application by Does not apply route 2 (two) times daily. 100 each 1   losartan (COZAAR) 50 MG tablet Take 1 tablet (50 mg total) by mouth daily. 30 tablet 0   Multiple Vitamin (MULTIVITAMIN ADULT PO) Take 1 tablet by mouth daily.     Pirfenidone (ESBRIET)  267 MG TABS Take 3 tablets (801 mg total) by mouth in the morning, at noon, and at bedtime. 270 tablet 11   rosuvastatin (CRESTOR) 20 MG tablet Take 1 tablet (20 mg total) by mouth daily. 30 tablet 0   SYMBICORT 160-4.5 MCG/ACT inhaler Inhale 2 puffs into the lungs 2 (two) times daily. 1 each 5   Tiotropium Bromide Monohydrate (SPIRIVA RESPIMAT) 1.25 MCG/ACT AERS Inhale 2 puffs into the lungs daily. 4 g 5   traMADol (ULTRAM) 50 MG tablet Take 1 tablet (50 mg total) by mouth every 8 (eight) hours as needed for severe pain. 30 tablet 0   traZODone (DESYREL) 50 MG tablet Take 1 tablet (50 mg total) by mouth at bedtime. 30 tablet 0   venlafaxine XR (EFFEXOR-XR) 75 MG 24 hr capsule Take 1 capsule (75 mg total) by mouth daily with breakfast. 30 capsule 0   No current facility-administered medications on file prior to visit.    PAST MEDICAL HISTORY: Past Medical History:  Diagnosis Date   Arthritis    especially in shoulders   Asthma    Depression    Diabetes mellitus    Embolic stroke ([place]) 4/38/8875   GERD (gastroesophageal reflux disease)    Headache(784.0)    "mild"   Hypertension    Loop Biotronik loop implant 12/16/2020 12/16/2020   Mental disorder    depression   Neuropathy    feet    Pain    arthritis pain - takes tramadol as needed   Rectal polyp    very little bleeding with bowel movements- no pain   Stroke ([place]) 11/03/2020   Suicide attempt ([place])     PAST SURGICAL HISTORY: Past Surgical History:  Procedure Laterality Date   ANAL RECTAL MANOMETRY N/A 07/13/2016   Procedure: ANO RECTAL MANOMETRY;  Surgeon: Leighton Ruff, MD;  Location: [facility];  Service: Endoscopy;  Laterality: N/A;   CESAREAN SECTION     EUS N/A 11/21/2012   Procedure: LOWER ENDOSCOPIC ULTRASOUND (EUS);  Surgeon: Arta Silence, MD;  Location: [facility];  Service: Endoscopy;  Laterality: N/A;   FLEXIBLE SIGMOIDOSCOPY N/A 11/21/2012   Procedure: FLEXIBLE SIGMOIDOSCOPY;  Surgeon: Arta Silence, MD;   Location: [facility];  Service: Endoscopy;  Laterality: N/A;   FLEXIBLE SIGMOIDOSCOPY N/A 01/28/2013   Procedure: FLEXIBLE SIGMOIDOSCOPY;  Surgeon: Leighton Ruff, MD;  Location: [facility];  Service: Endoscopy;  Laterality: N/A;   LAPAROSCOPIC LOW ANTERIOR RESECTION N/A 01/29/2013   Procedure: LAPAROSCOPIC LOW ANTERIOR RESECTION, Rigid Proctoscopy;  Surgeon: Leighton Ruff, MD;  Location: [facility];  Service: General;  Laterality: N/A;   LAPAROSCOPIC SIGMOID COLECTOMY N/A 11/14/2012   Procedure: DIAGNOSTIC LAPAROSCOPY AND SIGMOIDMOIDOSCOPY ;  Surgeon: Rolm Bookbinder, MD;  Location: [facility];  Service: General;  Laterality: N/A;   RECTAL ULTRASOUND N/A 07/13/2016   Procedure: RECTAL ULTRASOUND;  Surgeon: Leighton Ruff, MD;  Location: [facility];  Service: Endoscopy;  Laterality: N/A;   TONSILLECTOMY     TONSILLECTOMY AND ADENOIDECTOMY  FAMILY HISTORY: The patient's family history includes Cancer in her maternal aunt, maternal uncle, maternal uncle, maternal uncle, paternal aunt, and paternal uncle; Cancer (age of onset: 63) in an other family member; Diabetes in her father; Heart disease in her mother; Hyperlipidemia in her mother; Hypertension in her brother, brother, father, mother, sister, and sister; Stroke in her father.   SOCIAL HISTORY:  The patient  reports that she quit smoking about 3 years ago. Her smoking use included cigarettes. She has a 10.00 pack-year smoking history. She has never used smokeless tobacco. She reports that she does not currently use alcohol. She reports that she does not currently use drugs after having used the following drugs: "Crack" cocaine.  Review of Systems  Constitutional: Negative for chills and fever.  HENT:  Negative for hoarse voice and nosebleeds.   Eyes:  Negative for discharge, double vision and pain.  Cardiovascular:  Negative for chest pain, claudication, leg swelling, near-syncope, orthopnea, palpitations, paroxysmal nocturnal dyspnea and  syncope.  Respiratory:  Negative for hemoptysis and shortness of breath.   Musculoskeletal:  Negative for muscle cramps and myalgias.  Gastrointestinal:  Negative for abdominal pain, constipation, diarrhea, hematemesis, hematochezia, melena, nausea and vomiting.  Neurological:  Positive for light-headedness (chronic and stable), numbness and paresthesias. Negative for dizziness.   PHYSICAL EXAM: Vitals with BMI 03/30/2021 03/30/2021 03/25/2021  Height - - _0   Weight - - 184 lbs  BMI - - 93.2  Systolic 355 732 202  Diastolic 86 72 82  Pulse - - 77    CONSTITUTIONAL: Appears older than stated age.  Hemodynamically stable.  well-nourished. No acute distress.  Ambulates with 2 wheel walker SKIN: Skin is warm and dry. No rash noted. No cyanosis. No pallor. No jaundice HEAD: Normocephalic and atraumatic.  EYES: No scleral icterus MOUTH/THROAT: Moist oral membranes.  NECK: No JVD present. No thyromegaly noted. No carotid bruits  LYMPHATIC: No visible cervical adenopathy.  CHEST Normal respiratory effort. No intercostal retractions  LUNGS: Clear to auscultation bilaterally.  No stridor. No wheezes. No rales.  CARDIOVASCULAR: Regular rate and rhythm, positive S1-S2, no murmurs rubs or gallops appreciated. ABDOMINAL: Obese, soft, nontender, nondistended, positive bowel sounds all 4 quadrants. No apparent ascites.  EXTREMITIES: No peripheral edema  HEMATOLOGIC: No significant bruising NEUROLOGIC: Oriented to person, place, and time.  5 out of 5 strength in the right upper and lower extremity.  4 out of 5 strength in the left upper and lower extremity. PSYCHIATRIC: Normal mood and affect. Normal behavior. Cooperative  RADIOLOGY: CT of chest 04/12/2019:  1. The appearance of the lungs remains compatible with interstitial lung disease, with a spectrum of findings considered diagnostic of usual interstitial pneumonia (UIP) per current ATS guidelines. Minimal progression compared to the prior  study. 2. Aortic atherosclerosis, in addition to left anterior descending coronary artery disease.  3. Cholelithiasis.  Aortic Atherosclerosis (ICD10-I70.0).  CT head without contrast 06/25/2020: 1. Lacunar infarcts in the right caudate and right cerebellum, new from 2012, but age indeterminate. 2. No hemorrhage or evidence of large or territorial ischemia. 3. Chronic left sphenoid sinusitis.  MRI brain without contrast 06/25/2020: 1. 1 cm acute infarction in the anterolateral thalamus on the right, possibly with some involvement of the posterior limb internal capsule. Acute infarction within the right caudate head. Scattered other foci of high signal on diffusion imaging within the hemispheric white matter, not showing restricted diffusion on the ADC map, and therefore probably representing T2 shine through from old white matter infarctions. 2.  Extensive old small vessel infarctions throughout the brain as outlined above. Small old cortical infarctions in the right frontal lobe and both parieto-occipital regions. 3. No large or medium vessel occlusion or correctable proximal stenosis. Mild atherosclerotic irregularity of the PCA branches.  MR cervical spine without contrast 06/26/2020 1. 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 Mild generalized atrophy of the upper cervical spinal cord. 2. Moderate C6-7 and mild C5-6 and C7-T1 spinal canal stenosis secondary to central disc protrusions.   MR Brain Wo Contrast (neuro protocol) Result Date: 11/03/2020 IMPRESSION: Newly seen punctate acute infarction at the medial right parietooccipital junction. No other change since the most recent exam of January. Chronic small-vessel ischemic changes of the pons, cerebellum, thalami, basal ganglia and hemispheric white matter. Old cortical and subcortical infarctions in the occipital regions and frontoparietal regions.  CARDIAC DATABASE: EKG: 11/25/2020: Normal  sinus rhythm, 86 bpm, left atrial enlargement, nonspecific T wave abnormality.    Echocardiogram: 11/04/2020: LVEF 60-65%, moderate LVH, grade 1 diastolic impairment, elevated LAP, normal right ventricular size and function, mild AR.  Stress Testing:  Lexiscan tetrofosmin stress test 07/22/2019: There is  apical thinning an mild soft tissue attenuation artifact in inferior wall without ischemia or infarct. All segments of left ventricle demonstrated normal wall motion and thickening. Stress LV EF is moderately dysfunctional 39%, however visually appearted to be normal. No previous exam available for comparison. Low risk study.   Carotid Duplex 11/05/2020: Right Carotid: The extracranial vessels were near-normal with only minimal wall thickening or plaque.  Left Carotid: The extracranial vessels were near-normal with only minimal wall thickening or plaque.  Vertebrals: Bilateral vertebral arteries demonstrate antegrade flow.   Heart Catheterization: None  14 day extended Holter monitor: Dominant rhythm normal sinus rhythm Heart rate 44-179 bpm.  Avg HR 69 bpm. No atrial fibrillation, high grade AV block, pauses (3 seconds or longer). 1 episode of nonsustained ventricular tachycardia, 13 beats in duration, max heart rate of 179 bpm. 1 episode of supraventricular tachycardia, 6 beats in duration, maximum heart rate 164bpm. Total ventricular ectopic burden <1%. Total supraventricular ectopic burden <1%. Patient triggered events: 0.  Biotronik loop recorder implantation 12/16/2020  Remote loop recorder transmission 03/18/2021: Predominant rhythm is normal sinus rhythm.  Frequent brief atrial monitoring episodes = PACs.  No atrial fibrillation.  LABORATORY DATA: CBC Latest Ref Rng & Units 11/30/2020 11/17/2020 11/13/2020  WBC 3.8 - 10.8 Thousand/uL 4.7 3.6(L) 3.1(L)  Hemoglobin 11.7 - 15.5 g/dL 9.8(L) 10.9(L) 10.3(L)  Hematocrit 35.0 - 45.0 % 29.0(L) 31.3(L) 28.8(L)  Platelets 140 - 400  Thousand/uL 147 154 146(L)    CMP Latest Ref Rng & Units 03/08/2021 11/30/2020 11/20/2020  Glucose 70 - 99 mg/dL 103(H) 58(L) 120(H)  BUN 8 - 27 mg/dL 21 26(H) 36(H)  Creatinine 0.57 - 1.00 mg/dL 1.72(H) 1.50(H) 1.78(H)  Sodium 134 - 144 mmol/L 141 141 135  Potassium 3.5 - 5.2 mmol/L 5.3(H) 4.5 4.4  Chloride 96 - 106 mmol/L 105 110 108  CO2 20 - 29 mmol/L 21 22 19(L)  Calcium 8.7 - 10.3 mg/dL 9.7 8.6 9.0  Total Protein 6.0 - 8.5 g/dL 7.5 - -  Total Bilirubin 0.0 - 1.2 mg/dL 0.8 - -  Alkaline Phos 44 - 121 IU/L 63 - -  AST 0 - 40 IU/L 23 - -  ALT 0 - 32 IU/L 14 - -    Lipid Panel  Lab Results  Component Value Date   CHOL 132 03/08/2021  HDL 76 03/08/2021   LDLCALC 45 03/08/2021   LDLDIRECT 37 03/08/2021   TRIG 46 03/08/2021   CHOLHDL 2.3 11/04/2020   Lab Results  Component Value Date   HGBA1C 6.3 (H) 11/04/2020   HGBA1C 5.2 06/26/2020   HGBA1C 7.9 (H) 05/11/2019   No components found for: NTPROBNP Lab Results  Component Value Date   TSH 3.642 06/26/2020   TSH 3.53 06/25/2020   TSH 2.562 08/21/2008    Cardiac Panel (last 3 results) No results for input(s): CKTOTAL, CKMB, TROPONINIHS, RELINDX in the last 72 hours.  IMPRESSION:    ICD-10-CM   1. Coronary atherosclerosis due to calcified coronary lesion of native artery  I25.10    I25.84     2. Hyperkalemia  P79.2 Basic metabolic panel    3. Loop Biotronik loop implant 12/16/2020  Z95.818     4. Hx of stroke involving the right caudate nucleus and right anterolateral thalamus (Jan 2022), Hx of  right parietooccipital stroke (May 2022)  Z86.73     5. Hypertriglyceridemia  E78.1     6. Benign hypertension with CKD (chronic kidney disease) stage III (HCC)  I12.9    N18.30     7. Type 2 diabetes mellitus with stage 3b chronic kidney disease, with long-term current use of insulin (HCC)  E11.22    N18.32    Z79.4     8. Long-term insulin use (HCC)  Z79.4        RECOMMENDATIONS: Erica [name] is a 65 y.o. female  whose past medical history and cardiovascular risk factors include: Recent stroke involving the right caudate nucleus and right anterolateral thalamus (March 2022), rectal CA in 2014 that is in remission, T2DM, former tobacco use, emphysema, interstitial lung disease, idiopathic pulmonary fibrosis, and coronary artery calcification on CT scan.    Coronary atherosclerosis due to calcified coronary lesion of native artery Denies any anginal discomfort. Ischemic evaluation as outlined above. Continue aspirin and statin therapy.  Hyperkalemia Noted on most recent labs from 02/2021. Repeat BMP. Currently not on any potassium supplements. Patient's consumes high content of potassium rich foods. She is encouraged to reducing processed food items and rechecking a BMP.  Loop Biotronik loop implant 12/16/2020 Most recent loop interrogation from October 2022 notes no evidence of atrial fibrillation as discussed above. Will continue to monitor  Hx of stroke involving the right caudate nucleus and right anterolateral thalamus (Jan 2022), Hx of  right parietooccipital stroke (May 2022) No reoccurrence of stroke since last office encounter. Continue aspirin and statin therapy. Blood pressure is in acceptable range. LDL currently at goal. Triglycerides are currently at goal. Educated on importance of secondary prevention.  Hypertriglyceridemia Has tolerated the initiation of Tricor. Triglycerides currently at goal. Most recent labs from 02/2021 independently reviewed.  Benign hypertension with CKD (chronic kidney disease) stage III ([place]) Office blood pressure is at goal.  Medication reconciled.  Educated on the importance of low salt diet.    Type 2 diabetes mellitus with stage 3b chronic kidney disease, with long-term current use of insulin (HCC) Currently on losartan, aspirin, statin therapy. Currently managed by primary care provider.  FINAL MEDICATION LIST END OF ENCOUNTER:  No orders of the  defined types were placed in this encounter.    Current Outpatient Medications:    acetaminophen (TYLENOL) 325 MG tablet, Take 2 tablets (650 mg total) by mouth every 4 (four) hours as needed for mild pain (or temp > 37.5 C (99.5 F))., Disp: , Rfl:  albuterol (VENTOLIN HFA) 108 (90 Base) MCG/ACT inhaler, Inhale 2 puffs into the lungs every 4 (four) hours as needed for wheezing., Disp: 1 g, Rfl: 5   amLODipine (NORVASC) 10 MG tablet, Take 1 tablet (10 mg total) by mouth daily., Disp: 30 tablet, Rfl: 2   aspirin EC 81 MG EC tablet, Take 1 tablet (81 mg total) by mouth daily. Swallow whole., Disp: 30 tablet, Rfl: 11   busPIRone (BUSPAR) 10 MG tablet, Take 1 tablet (10 mg total) by mouth 2 (two) times daily., Disp: 60 tablet, Rfl: 0   diclofenac Sodium (VOLTAREN) 1 % GEL, Apply 2 g topically daily as needed (Knee pain)., Disp: 2 g, Rfl: 0   famotidine (PEPCID) 20 MG tablet, Take 1 tablet (20 mg total) by mouth daily., Disp: 30 tablet, Rfl: 0   gabapentin (NEURONTIN) 100 MG capsule, Take 2 capsules (200 mg total) by mouth 2 (two) times daily as needed (pain)., Disp: 60 capsule, Rfl: 0   insulin glargine (LANTUS SOLOSTAR) 100 UNIT/ML Solostar Pen, Please provide 24 units every a.m. and 20 units every p.m., Disp: 15 mL, Rfl: 11   Insulin Pen Needle (PEN NEEDLES) 32G X 6 MM MISC, 1 application by Does not apply route 2 (two) times daily., Disp: 100 each, Rfl: 1   losartan (COZAAR) 50 MG tablet, Take 1 tablet (50 mg total) by mouth daily., Disp: 30 tablet, Rfl: 0   Multiple Vitamin (MULTIVITAMIN ADULT PO), Take 1 tablet by mouth daily., Disp: , Rfl:    Pirfenidone (ESBRIET) 267 MG TABS, Take 3 tablets (801 mg total) by mouth in the morning, at noon, and at bedtime., Disp: 270 tablet, Rfl: 11   rosuvastatin (CRESTOR) 20 MG tablet, Take 1 tablet (20 mg total) by mouth daily., Disp: 30 tablet, Rfl: 0   SYMBICORT 160-4.5 MCG/ACT inhaler, Inhale 2 puffs into the lungs 2 (two) times daily., Disp: 1 each, Rfl:  5   Tiotropium Bromide Monohydrate (SPIRIVA RESPIMAT) 1.25 MCG/ACT AERS, Inhale 2 puffs into the lungs daily., Disp: 4 g, Rfl: 5   traMADol (ULTRAM) 50 MG tablet, Take 1 tablet (50 mg total) by mouth every 8 (eight) hours as needed for severe pain., Disp: 30 tablet, Rfl: 0   traZODone (DESYREL) 50 MG tablet, Take 1 tablet (50 mg total) by mouth at bedtime., Disp: 30 tablet, Rfl: 0   venlafaxine XR (EFFEXOR-XR) 75 MG 24 hr capsule, Take 1 capsule (75 mg total) by mouth daily with breakfast., Disp: 30 capsule, Rfl: 0  Orders Placed This Encounter  Procedures   Basic metabolic panel    --Continue cardiac medications as reconciled in final medication list. --Return in about 6 months (around 09/23/2021) for Follow up. Or sooner if needed. --Continue follow-up with your primary care physician regarding the management of your other chronic comorbid conditions.  Patient's questions and concerns were addressed to her satisfaction. She voices understanding of the instructions provided during this encounter.   This note was created using a voice recognition software as a result there may be grammatical errors inadvertently enclosed that do not reflect the nature of this encounter. Every attempt is made to correct such errors.  Rex Kras, [place], [facility]  Pager: [phone] Office: [phone]

## 2021-03-26 ENCOUNTER — Ambulatory Visit: Payer: Medicare HMO | Admitting: Occupational Therapy

## 2021-03-26 ENCOUNTER — Ambulatory Visit: Payer: Medicare HMO

## 2021-03-30 ENCOUNTER — Encounter: Payer: Self-pay | Admitting: Physical Therapy

## 2021-03-30 ENCOUNTER — Ambulatory Visit: Payer: Medicare HMO | Admitting: Physical Therapy

## 2021-03-30 ENCOUNTER — Other Ambulatory Visit: Payer: Self-pay

## 2021-03-30 ENCOUNTER — Ambulatory Visit: Payer: Medicare HMO | Admitting: Occupational Therapy

## 2021-03-30 VITALS — BP 146/86

## 2021-03-30 VITALS — BP 148/72

## 2021-03-30 DIAGNOSIS — R208 Other disturbances of skin sensation: Secondary | ICD-10-CM

## 2021-03-30 DIAGNOSIS — R2689 Other abnormalities of gait and mobility: Secondary | ICD-10-CM

## 2021-03-30 DIAGNOSIS — M6281 Muscle weakness (generalized): Secondary | ICD-10-CM

## 2021-03-30 DIAGNOSIS — R2681 Unsteadiness on feet: Secondary | ICD-10-CM

## 2021-03-30 DIAGNOSIS — R41840 Attention and concentration deficit: Secondary | ICD-10-CM

## 2021-03-30 DIAGNOSIS — R278 Other lack of coordination: Secondary | ICD-10-CM

## 2021-03-30 DIAGNOSIS — R41844 Frontal lobe and executive function deficit: Secondary | ICD-10-CM

## 2021-03-30 NOTE — Therapy (Signed)
[facility] [address] [place], [place], [address] Phone: [phone]   Fax:  [phone]  Physical Therapy Treatment  Patient Details  Name: Erica [name] MRN: [id number] Date of Birth: 05-08-1956 Referring Provider (PT): [name]   Encounter Date: 03/30/2021   PT End of Session - 03/30/21 1152     Visit Number 9    Number of Visits 17    Date for PT Re-Evaluation 04/16/21    Authorization Type [employer] medicare    Authorization Time Period 17 visits 9/6-11/4/22    Authorization - Visit Number 9    Authorization - Number of Visits 17    Progress Note Due on Visit 10    PT Start Time 8466    PT Stop Time 1225    PT Time Calculation (min) 40 min    Equipment Utilized During Treatment Gait belt    Activity Tolerance Patient tolerated treatment well    Behavior During Therapy [facility] for tasks assessed/performed             Past Medical History:  Diagnosis Date   Arthritis    especially in shoulders   Asthma    Depression    Diabetes mellitus    Embolic stroke ([place]) 5/99/3570   GERD (gastroesophageal reflux disease)    Headache(784.0)    "mild"   Hypertension    Loop Biotronik loop implant 12/16/2020 12/16/2020   Mental disorder    depression   Neuropathy    feet    Pain    arthritis pain - takes tramadol as needed   Rectal polyp    very little bleeding with bowel movements- no pain   Stroke ([place]) 11/03/2020   Suicide attempt [facility])     Past Surgical History:  Procedure Laterality Date   ANAL RECTAL MANOMETRY N/A 07/13/2016   Procedure: ANO RECTAL MANOMETRY;  Surgeon: [facility] Ruff, MD;  [facility];  Service: Endoscopy;  Laterality: N/A;   CESAREAN SECTION     EUS N/A 11/21/2012   Procedure: LOWER ENDOSCOPIC ULTRASOUND (EUS);  Surgeon: [facility] Silence, MD;  [facility];  Service: Endoscopy;  Laterality: N/A;   FLEXIBLE SIGMOIDOSCOPY N/A 11/21/2012   Procedure: FLEXIBLE  SIGMOIDOSCOPY;  Surgeon: Arta Silence, MD;  [facility];  Service: Endoscopy;  Laterality: N/A;   FLEXIBLE SIGMOIDOSCOPY N/A 01/28/2013   Procedure: FLEXIBLE SIGMOIDOSCOPY;  Surgeon: [facility] Ruff, MD;  [facility];  Service: Endoscopy;  Laterality: N/A;   LAPAROSCOPIC LOW ANTERIOR RESECTION N/A 01/29/2013   Procedure: LAPAROSCOPIC LOW ANTERIOR RESECTION, Rigid Proctoscopy;  Surgeon: [facility] Ruff, MD;  [facility];  Service: General;  Laterality: N/A;   LAPAROSCOPIC SIGMOID COLECTOMY N/A 11/14/2012   Procedure: DIAGNOSTIC LAPAROSCOPY AND SIGMOIDMOIDOSCOPY ;  Surgeon: Rolm Bookbinder, MD;  [facility];  Service: General;  Laterality: N/A;   RECTAL ULTRASOUND N/A 07/13/2016   Procedure: RECTAL ULTRASOUND;  Surgeon: [facility] Ruff, MD;  [facility];  Service: Endoscopy;  Laterality: N/A;   TONSILLECTOMY     TONSILLECTOMY AND ADENOIDECTOMY      Vitals:   03/30/21 1208  BP: (!) 146/86     Subjective Assessment - 03/30/21 1147     Subjective Pt reports MD was unable to do cortizone injection due to elevated BP. Pt's BP today at OT session prior was 148/72. No falls.    Pertinent History arthritis, asthma, depression, HTN, neuropathy, suicide attempt, DM, CVA    Patient Stated Goals Pt would like to be able  to walk and use her hands better.    Currently in Pain? Yes    Pain Score 3     Pain Location Shoulder    Pain Orientation Left    Pain Descriptors / Indicators Aching    Pain Type Acute pain    Pain Onset 1 to 4 weeks ago    Aggravating Factors  laying on her L side, raising it up too high    Pain Relieving Factors sitting up                               [facility] Adult PT Treatment/Exercise - 03/30/21 1151       Knee/Hip Exercises: Aerobic   Nustep UE/LE's on level 3.0 x 6 minutes with goal >/= 60 steps per minute for strengthening and activity tolerance.                 Balance Exercises - 03/30/21 1205        Balance Exercises: Standing   Standing Eyes Opened Narrow base of support (BOS);Foam/compliant surface;Limitations    Standing Eyes Opened Limitations Slight space between feet x10 reps head turns, x10 reps head nods.    Standing Eyes Closed Foam/compliant surface;Limitations    Standing Eyes Closed Limitations Feet more narrow than hip width 3 x 30 seconds    SLS Eyes open;Solid surface;Limitations    SLS Limitations Alternating SLS taps to cones x12 reps bilat, with fingertip support, cues for quad/glute activation with SLS on LLE    Stepping Strategy Anterior;Posterior;Foam/compliant surface;Limitations    Stepping Strategy Limitations on blue foam beam, alternating legs each direction x10 reps with UE support. performed an additional x3 reps bilat with no UE support posteriorly. pt fatigues more easily without UE support    Sidestepping Foam/compliant support;2 reps;Limitations    Sidestepping Limitations down and back x2 reps on blue foam beam, fngertips needed for balance    Marching Solid surface;Limitations    Marching Limitations Forward and then backwards in // bars, cues for slowed pace and bigger steps backwards                  PT Short Term Goals - 03/17/21 1410       PT SHORT TERM GOAL #1   Title Pt will be independent with initial strengthening and balance HEP.    Baseline Pt reports exercises going pretty good.    Time 4    Period Weeks    Status Achieved    Target Date 03/17/21      PT SHORT TERM GOAL #2   Title Pt will decrease 5 x sit to stand from 28.59 sec to <20 sec for improved balance and functional mobility.    Baseline 02/16/21 28.59 sec. 03/17/21 16.63 sec    Time 4    Period Weeks    Status Achieved    Target Date 03/17/21      PT SHORT TERM GOAL #3   Title Pt will increase Berg from 38 to 42/56 for improved balance and decreased falls risk.    Baseline 02/16/21 38/56. 03/17/21 44/56    Time 4    Period Weeks    Status Achieved    Target Date  03/17/21      PT SHORT TERM GOAL #4   Title Pt will ambulate >500' with RW on level surfaces mod I for improved household and short community distances.    Baseline 03/17/21 230'  with RW supervision    Time 4    Period Weeks    Status Not Met    Target Date 03/17/21               PT Long Term Goals - 02/17/21 0827       PT LONG TERM GOAL #1   Title Pt will be independent with progressive HEP for strengthening and balance to continue gains on own.    Time 8    Period Weeks    Status New    Target Date 04/16/21      PT LONG TERM GOAL #2   Title Pt will increase gait speed to >0.29ms for improved gait safety.    Baseline 02/16/21 0.330m    Time 8    Period Weeks    Status New    Target Date 04/16/21      PT LONG TERM GOAL #3   Title Pt will increase Berg Balance to >45/56 for improved balance and decreased fall risk.    Baseline 02/16/21 38/56    Time 8    Period Weeks    Status New    Target Date 04/16/21      PT LONG TERM GOAL #4   Title Pt will ambulate >800' on varied surfaces with LRAD mod I for improved short community distances.    Time 8    Period Weeks    Status New    Target Date 04/16/21      PT LONG TERM GOAL #5   Title Pt will ambulate up/down 4 steps with 1 rail mod I to safely enter/exit home.    Time 8    Period Weeks    Status New    Target Date 04/16/21      Additional Long Term Goals   Additional Long Term Goals Yes      PT LONG TERM GOAL #6   Title Pt will increase FOTO from 50 to 60.    Baseline 02/16/21 50    Time 8    Period Weeks    Status New    Target Date 04/16/21                   Plan - 03/30/21 1228     Clinical Impression Statement Today's skilled session focused on BLE strengthening, SLS tasks, and standing balance on compliant surfaces. Pt challenged on air ex with eyes closed and with eyes open and head turns. Worked on SLS tasks on LLE with attempting to decr UE support. Pt needing intermittent seated rest  breaks during session. Will continue to progress towards LTGs.    Personal Factors and Comorbidities Comorbidity 3+    Comorbidities arthritis, asthma, depression, HTN, neuropathy, suicide attempt, DM    Examination-Activity Limitations Locomotion Level;Transfers;Stand;Stairs;Squat    Examination-Participation Restrictions Community Activity;Driving;Yard Work;Cleaning    Stability/Clinical Decision Making Evolving/Moderate complexity    Rehab Potential Good    PT Frequency 2x / week   plus eval   PT Duration 8 weeks    PT Treatment/Interventions ADLs/Self Care Home Management;DME Instruction;Neuromuscular re-education;Manual techniques;Balance training;Therapeutic exercise;Therapeutic activities;Functional mobility training;Stair training;Gait training;Vestibular;Passive range of motion    PT Next Visit Plan 10th visit PN. Continue LLE strengthening and balance training. Gait training with walker as well as with cane with quad tip. Step-ups with LLE working on control.    Consulted and Agree with Plan of Care Patient             Patient will  benefit from skilled therapeutic intervention in order to improve the following deficits and impairments:  Abnormal gait, Decreased balance, Decreased activity tolerance, Decreased mobility, Decreased strength, Decreased knowledge of use of DME, Decreased endurance, Impaired sensation  Visit Diagnosis: Muscle weakness (generalized)  Unsteadiness on feet  Other abnormalities of gait and mobility     Problem List Patient Active Problem List   Diagnosis Date Noted   Encounter for loop recorder check 01/02/2021   Loop Biotronik loop implant 12/16/2020 12/16/2020   Leukopenia    Controlled type 2 diabetes mellitus with hyperglycemia, with long-term current use of insulin (HCC)    Vascular headache    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[place]) 59/56/3875   Embolic stroke ([place]) 64/33/2951   Sinus bradycardia on ECG 11/03/2020   Benign essential HTN     Pancytopenia ([place])    Stage 3b chronic kidney disease ([place])    Anemia    Labile blood glucose    Labile blood pressure    Diabetic peripheral neuropathy ([place])    Essential hypertension    AKI (acute kidney injury) ([place])    Ataxia, late effect of cerebrovascular disease    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[place]) 06/30/2020   Acute CVA (cerebrovascular accident) ([place]) 06/26/2020   CVA (cerebral vascular accident) ([place]) 06/25/2020   BMI 32.0-32.9,adult 06/03/2020   Left upper lobe pneumonia 03/30/2020   Chest pain 10/01/2019   MDD (major depressive disorder), recurrent severe, without psychosis ([place]) 05/09/2019   Suicide attempt ([place]) 05/09/2019   Cocaine abuse ([place]) 05/09/2019   Interstitial lung disease ([place]) 06/21/2016   COPD (chronic obstructive pulmonary disease) ([place]) 06/21/2016   Cough 06/21/2016   GERD (gastroesophageal reflux disease) 06/21/2016   Hypersomnia 06/21/2016   Tobacco user 06/21/2016   Rectal cancer ([place]) 01/30/2013    Arliss Journey, PT, DPT  03/30/2021, 12:30 PM  [facility] [address] [place], [place], [address] Phone: [phone]   Fax:  [phone]  Name: HYDIA COPELIN MRN: [id number] Date of Birth: [birth year]

## 2021-03-30 NOTE — Therapy (Signed)
[facility] [address] [place], [place], [address] Phone: [phone]   Fax:  [phone]  Occupational Therapy Treatment  Patient Details  Name: Erica [name] MRN: [id number] Date of Birth: 04-04-1956 Referring Provider (OT): Dr. Letta Pate   Encounter Date: 03/30/2021   OT End of Session - 03/30/21 1215     Visit Number 9    Number of Visits 25    Date for OT Re-Evaluation 05/19/21    Authorization Type Medicare/ Medicaid    Authorization - Visit Number 9    Progress Note Due on Visit 10    OT Start Time 1105    OT Stop Time 1145    OT Time Calculation (min) 40 min    Activity Tolerance Patient tolerated treatment well    Behavior During Therapy [facility] for tasks assessed/performed             Past Medical History:  Diagnosis Date   Arthritis    especially in shoulders   Asthma    Depression    Diabetes mellitus    Embolic stroke ([place]) 10/09/7679   GERD (gastroesophageal reflux disease)    Headache(784.0)    "mild"   Hypertension    Loop Biotronik loop implant 12/16/2020 12/16/2020   Mental disorder    depression   Neuropathy    feet    Pain    arthritis pain - takes tramadol as needed   Rectal polyp    very little bleeding with bowel movements- no pain   Stroke ([place]) 11/03/2020   Suicide attempt [facility])     Past Surgical History:  Procedure Laterality Date   ANAL RECTAL MANOMETRY N/A 07/13/2016   Procedure: ANO RECTAL MANOMETRY;  Surgeon: [facility], MD;  [facility];  Service: Endoscopy;  Laterality: N/A;   CESAREAN SECTION     EUS N/A 11/21/2012   Procedure: LOWER ENDOSCOPIC ULTRASOUND (EUS);  Surgeon: [facility] Silence, MD;  [facility];  Service: Endoscopy;  Laterality: N/A;   FLEXIBLE SIGMOIDOSCOPY N/A 11/21/2012   Procedure: FLEXIBLE SIGMOIDOSCOPY;  Surgeon: [facility] Silence, MD;  [facility];  Service: Endoscopy;  Laterality: N/A;   FLEXIBLE SIGMOIDOSCOPY N/A  01/28/2013   Procedure: FLEXIBLE SIGMOIDOSCOPY;  Surgeon: [facility]uff, MD;  [facility];  Service: Endoscopy;  Laterality: N/A;   LAPAROSCOPIC LOW ANTERIOR RESECTION N/A 01/29/2013   Procedure: LAPAROSCOPIC LOW ANTERIOR RESECTION, Rigid Proctoscopy;  Surgeon: [facility]uff, MD;  [facility];  Service: General;  Laterality: N/A;   LAPAROSCOPIC SIGMOID COLECTOMY N/A 11/14/2012   Procedure: DIAGNOSTIC LAPAROSCOPY AND SIGMOIDMOIDOSCOPY ;  Surgeon: [facility] Bookbinder, MD;  [facility];  Service: General;  Laterality: N/A;   RECTAL ULTRASOUND N/A 07/13/2016   Procedure: RECTAL ULTRASOUND;  Surgeon: [facility], MD;  [facility];  Service: Endoscopy;  Laterality: N/A;   TONSILLECTOMY     TONSILLECTOMY AND ADENOIDECTOMY      Vitals:   03/30/21 1118  BP: (!) 148/72     Subjective Assessment - 03/30/21 1118     Subjective  Pt reports MD was unable to do cortizone injection due to elevated BP    Pertinent History Referred by [name] 01/21/21 , post-stroke  History of right parieto-occipital infarct. Most recent stroke 11/03/20. PMH: arthritis, asthma, depression, HTN, neuropathy, suicide attempt, DM    Patient Stated Goals do more stuff around the house    Currently in Pain? Yes    Pain Score 3     Pain Location Shoulder  Pain Orientation Left    Pain Descriptors / Indicators Aching    Pain Type Acute pain    Pain Onset More than a month ago    Pain Frequency Constant    Aggravating Factors  overhead reach    Pain Relieving Factors tylenol                           Treatment: Low to mid range functional reaching tasks with graded clothespins min v.c Grooved pegs for fine motor coordination and in hand manipulation, min v.c Seated AA/ROM shoulder flexion, circumduction and shoulder abduction for LUE holding vertical pole, min v.c for postioning, no increased pain. Attempted supine shoulder exercises however due to pain these were  discontinued.       OT Education - 03/30/21 1115     Education Details memory compensations, reveiwed sensory precuations, pt verbalized understanding    Person(s) Educated Patient    Methods Explanation;Handout    Comprehension Verbalized understanding              OT Short Term Goals - 03/30/21 1127       OT SHORT TERM GOAL #1   Title I with inital  HEP- 03/24/21    Time 4    Period Weeks    Status Achieved    Target Date 03/24/21      OT SHORT TERM GOAL #2   Title I with sensory precautions    Time 4    Period Weeks    Status Achieved   pt  verbalized understanding of precautions 03/23/21     OT SHORT TERM GOAL #3   Title Pt will perform basic home mangement with supervision    Time 4    Period Weeks    Status Achieved   pt reports doing laundry consistently from w/c level in apt 03/23/21     OT [place] #4   Title Pt will perform basic cooking with supervision demonstrating good safety awareness.    Time 4    Period Weeks    Status On-going   03/17/21, supervision and min v.c for walker safety     OT SHORT TERM GOAL #5   Title Pt will perform all basic ADLS modified independently.    Time 4    Period Weeks    Status Achieved   goes to beauty shop to get hair shampooed 03/10/21     OT SHORT TERM GOAL #6   Title Pt will retrieve a lightweight object at 115 shoulder flexion with LUE without dropping    Baseline 105*    Time 4    Period Weeks    Status Achieved   min v/c for decreasing abduction 03/23/21              OT Long Term Goals - 03/30/21 1126       OT LONG TERM GOAL #1   Title I with updated HEP. 05/19/21    Time 12    Period Weeks    Status On-going      OT LONG TERM GOAL #2   Title Pt will demonstrate improved fine motor coordination for ADLs as evidenced  by decreasing LUE 9 hole peg test to 28 secs without drops.    Time 12    Period Weeks    Status On-going      OT LONG TERM GOAL #3   Title Pt will demonstrate  ability to retrieve a 2  lbs object from overhead shelf without drops with LUE.    Time 12    Period Weeks    Status On-going      OT LONG TERM GOAL #4   Title Pt will perform all basic ADLs modified Indpendently.    Time 12    Period Weeks    Status Achieved      OT LONG TERM GOAL #5   Title Pt will perfom basic home management/ cooking mod I with good safety awareness.    Time 12    Period Weeks    Status On-going      OT LONG TERM GOAL #6   Title I with memory compensation strategies.    Time 12    Period Weeks    Status On-going                   Plan - 03/30/21 1215     Clinical Impression Statement Pt continues with shoulder pain from RTC. Pt was unable to receive cortizone injection due to elevated BP and blood sugars.    OT Occupational Profile and History Detailed Assessment- Review of Records and additional review of physical, cognitive, psychosocial history related to current functional performance    Occupational performance deficits (Please refer to evaluation for details): ADL's;IADL's;Leisure    Body Structure / Function / Physical Skills ADL;Endurance;UE functional use;Balance;Flexibility;FMC;Gait;ROM;GMC;Coordination;Decreased knowledge of precautions;Sensation;IADL;Decreased knowledge of use of DME;Dexterity;Mobility    Cognitive Skills Attention;Memory    Rehab Potential Good    Clinical Decision Making Limited treatment options, no task modification necessary    Comorbidities Affecting Occupational Performance: May have comorbidities impacting occupational performance    Modification or Assistance to Complete Evaluation  No modification of tasks or assist necessary to complete eval    OT Frequency 2x / week   plus eval, will likely d/c after 8 weeks dependent on progress   OT Duration 12 weeks    OT Treatment/Interventions Self-care/ADL training;Ultrasound;Patient/family education;DME and/or AE instruction;Gait Training;Passive range of motion;Balance  training;Fluidtherapy;Cryotherapy;Electrical Stimulation;Therapist, nutritional;Therapeutic activities;Manual Therapy;Therapeutic exercise;Cognitive remediation/compensation;Neuromuscular education    Plan monitor shoulder pain,(hx of rotator cuff tear) limit overhead reach, consider kinesiotape, middle deltoid, possible correction tape? simple home management tasks with emphasis on walker safety.    Consulted and Agree with Plan of Care Patient             Patient will benefit from skilled therapeutic intervention in order to improve the following deficits and impairments:   Body Structure / Function / Physical Skills: ADL, Endurance, UE functional use, Balance, Flexibility, FMC, Gait, ROM, GMC, Coordination, Decreased knowledge of precautions, Sensation, IADL, Decreased knowledge of use of DME, Dexterity, Mobility Cognitive Skills: Attention, Memory     Visit Diagnosis: Muscle weakness (generalized)  Unsteadiness on feet  Other lack of coordination  Frontal lobe and executive function deficit  Other disturbances of skin sensation  Attention and concentration deficit    Problem List Patient Active Problem List   Diagnosis Date Noted   Encounter for loop recorder check 01/02/2021   Loop Biotronik loop implant 12/16/2020 12/16/2020   Leukopenia    Controlled type 2 diabetes mellitus with hyperglycemia, with long-term current use of insulin (HCC)    Vascular headache    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[place]) 07/61/5183   Embolic stroke ([place]) 43/73/5789   Sinus bradycardia on ECG 11/03/2020   Benign essential HTN    Pancytopenia (HCC)    Stage 3b chronic kidney disease (HCC)    Anemia  Labile blood glucose    Labile blood pressure    Diabetic peripheral neuropathy (HCC)    Essential hypertension    AKI (acute kidney injury) ([place])    Ataxia, late effect of cerebrovascular disease    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[place]) 06/30/2020   Acute CVA (cerebrovascular accident) ([place])  06/26/2020   CVA (cerebral vascular accident) ([place]) 06/25/2020   BMI 32.0-32.9,adult 06/03/2020   Left upper lobe pneumonia 03/30/2020   Chest pain 10/01/2019   MDD (major depressive disorder), recurrent severe, without psychosis ([place]) 05/09/2019   Suicide attempt ([place]) 05/09/2019   Cocaine abuse ([place]) 05/09/2019   Interstitial lung disease ([place]) 06/21/2016   COPD (chronic obstructive pulmonary disease) ([place]) 06/21/2016   Cough 06/21/2016   GERD (gastroesophageal reflux disease) 06/21/2016   Hypersomnia 06/21/2016   Tobacco user 06/21/2016   Rectal cancer ([place]) 01/30/2013    Kristine Chahal, OT/L 03/30/2021, 12:17 PM  [facility] [address] [place], [place], [address] Phone: [phone]   Fax:  [phone]  Name: TYSHANA NISHIDA MRN: [id number] Date of Birth: 1955-12-11

## 2021-03-30 NOTE — Patient Instructions (Signed)
Memory Compensation Strategies  Use "WARM" strategy.  W= write it down  A= associate it  R= repeat it  M= make a mental note  2.   You can keep a Social worker.  Use a 3-ring notebook with sections for the following: calendar, important names and phone numbers, medications, doctors' names/phone numbers, lists/reminders, and a section to journal what you did each day.   3.    Use a calendar to write appointments down.  4.    Write yourself a schedule for the day.  This can be placed on the calendar or in a separate section of the Memory Notebook.  Keeping a  regular schedule can help memory.  5.    Use medication organizer with sections for each day or morning/evening pills.  You may need help loading it  6.    Keep a basket, or pegboard by the door.  Place items that you need to take out with you in the basket or on the pegboard.  You may also want to include a message board for reminders.  7.    Use sticky notes.  Place sticky notes with reminders in a place where the task is performed.  For example: " turn off the  stove" placed by the stove, "lock the door" placed on the door at eye level, " take your medications" on the bathroom mirror or by the place where you normally take your medications.  8.    Use alarms/timers.  Use while cooking to remind yourself to check on food or as a reminder to take your medicine, or as a  reminder to make a call, or as a reminder to perform another task, etc.

## 2021-04-02 ENCOUNTER — Ambulatory Visit: Payer: Medicare HMO | Admitting: Physical Therapy

## 2021-04-02 ENCOUNTER — Encounter: Payer: Self-pay | Admitting: Physical Therapy

## 2021-04-02 ENCOUNTER — Other Ambulatory Visit: Payer: Self-pay

## 2021-04-02 ENCOUNTER — Ambulatory Visit: Payer: Medicare HMO | Admitting: Occupational Therapy

## 2021-04-02 DIAGNOSIS — M6281 Muscle weakness (generalized): Secondary | ICD-10-CM

## 2021-04-02 DIAGNOSIS — R2681 Unsteadiness on feet: Secondary | ICD-10-CM

## 2021-04-02 DIAGNOSIS — R208 Other disturbances of skin sensation: Secondary | ICD-10-CM

## 2021-04-02 DIAGNOSIS — R41840 Attention and concentration deficit: Secondary | ICD-10-CM

## 2021-04-02 DIAGNOSIS — R278 Other lack of coordination: Secondary | ICD-10-CM

## 2021-04-02 DIAGNOSIS — R41844 Frontal lobe and executive function deficit: Secondary | ICD-10-CM

## 2021-04-02 DIAGNOSIS — R2689 Other abnormalities of gait and mobility: Secondary | ICD-10-CM

## 2021-04-02 NOTE — Patient Instructions (Signed)
Kinesiotape instructions:  Kinesiotape should remain on 4-5 days, if no issues.  However remove tape if any pain, irritation, swelling.  To remove: get the tape wet and then peel it off slowly.  You may shower with kinesiotape on, do not rub vigorously as it may begin to peel off.

## 2021-04-02 NOTE — Therapy (Signed)
[facility] [address] [place], [place], [address] Phone: [phone]   Fax:  [phone]  Physical Therapy Treatment/10th Visit Progress Note   Patient Details  Name: Erica [name] MRN: [id number] Date of Birth: 1956-02-05 Referring Provider (PT): [name]   10th Visit Physical Therapy Progress Note  Dates of Reporting Period: 02/16/21 to 04/02/21   Encounter Date: 04/02/2021   PT End of Session - 04/02/21 1019     Visit Number 10    Number of Visits 17    Date for PT Re-Evaluation 04/16/21    Authorization Type [employer] medicare    Authorization Time Period 17 visits 9/6-11/4/22    Authorization - Visit Number 10    Authorization - Number of Visits 17    Progress Note Due on Visit 10    PT Start Time 1017    Equipment Utilized During Treatment Gait belt    Activity Tolerance Patient tolerated treatment well    Behavior During Therapy [facility] for tasks assessed/performed             Past Medical History:  Diagnosis Date   Arthritis    especially in shoulders   Asthma    Depression    Diabetes mellitus    Embolic stroke ([place]) 6/96/2952   GERD (gastroesophageal reflux disease)    Headache(784.0)    "mild"   Hypertension    Loop Biotronik loop implant 12/16/2020 12/16/2020   Mental disorder    depression   Neuropathy    feet    Pain    arthritis pain - takes tramadol as needed   Rectal polyp    very little bleeding with bowel movements- no pain   Stroke ([place]) 11/03/2020   Suicide attempt [facility])     Past Surgical History:  Procedure Laterality Date   ANAL RECTAL MANOMETRY N/A 07/13/2016   Procedure: ANO RECTAL MANOMETRY;  Surgeon: Leighton Ruff, MD;  [facility];  Service: Endoscopy;  Laterality: N/A;   CESAREAN SECTION     EUS N/A 11/21/2012   Procedure: LOWER ENDOSCOPIC ULTRASOUND (EUS);  Surgeon: Arta Silence, MD;  Location: [facility];  Service: Endoscopy;  Laterality: N/A;    FLEXIBLE SIGMOIDOSCOPY N/A 11/21/2012   Procedure: FLEXIBLE SIGMOIDOSCOPY;  Surgeon: Arta Silence, MD;  [facility];  Service: Endoscopy;  Laterality: N/A;   FLEXIBLE SIGMOIDOSCOPY N/A 01/28/2013   Procedure: FLEXIBLE SIGMOIDOSCOPY;  Surgeon: Leighton Ruff, MD;  [facility];  Service: Endoscopy;  Laterality: N/A;   LAPAROSCOPIC LOW ANTERIOR RESECTION N/A 01/29/2013   Procedure: LAPAROSCOPIC LOW ANTERIOR RESECTION, Rigid Proctoscopy;  Surgeon: Leighton Ruff, MD;  [facility];  Service: General;  Laterality: N/A;   LAPAROSCOPIC SIGMOID COLECTOMY N/A 11/14/2012   Procedure: DIAGNOSTIC LAPAROSCOPY AND SIGMOIDMOIDOSCOPY ;  Surgeon: Rolm Bookbinder, MD;  [facility];  Service: General;  Laterality: N/A;   RECTAL ULTRASOUND N/A 07/13/2016   Procedure: RECTAL ULTRASOUND;  Surgeon: Leighton Ruff, MD;  [facility];  Service: Endoscopy;  Laterality: N/A;   TONSILLECTOMY     TONSILLECTOMY AND ADENOIDECTOMY      There were no vitals filed for this visit.   Subjective Assessment - 04/02/21 1019     Subjective Nothing new.    Pertinent History arthritis, asthma, depression, HTN, neuropathy, suicide attempt, DM, CVA    Patient Stated Goals Pt would like to be able to walk and use her hands better.    Currently in Pain? No/denies    Pain Onset 1 to  4 weeks ago                               [facility] Adult PT Treatment/Exercise - 04/02/21 1025       Ambulation/Gait   Ambulation/Gait Yes    Ambulation/Gait Assistance 5: Supervision;4: Min guard    Ambulation/Gait Assistance Details With RW in between activities, then 1 lap with cane with cues for posture and incr heel strike with LLE. Trialed a foot up brace with LLE and performed an additional x2 laps. Pt with improved foot clearance during 1st lap, but more fatigued during 2nd lap with more difficulty clearing foot. Pt stated that she likes foot up brace, discussed purchase options and can be  something to look into in the future    Ambulation Distance (Feet) 115 Feet   x2, 230' x 1   Assistive device Rolling walker;Straight cane   SPC with quad tip   Gait Pattern Step-through pattern;Decreased stance time - left;Decreased step length - right;Decreased hip/knee flexion - left;Narrow base of support    Ambulation Surface Level;Indoor    Gait velocity 14.9 seconds = .67 m/s   with RW   Stairs Yes    Stairs Assistance 5: Supervision    Stairs Assistance Details (indicate cue type and reason) Last rep able to perform ascending with single UE support, BUE support descending, takes more time when descending when LLE is lowering    Stair Management Technique Two rails;One rail Right;Alternating pattern;Forwards    Number of Stairs 4   x3 sets   Height of Stairs 6      Knee/Hip Exercises: Standing   Lateral Step Up Left;1 set;10 reps;Hand Hold: 2;Step Height: 4"    Lateral Step Up Limitations Cues for technique/knee extension    Step Down Both;2 sets;5 reps;Hand Hold: 2;Step Height: 4"    Step Down Limitations Cues for technique, when stepping down with LLE, cues for ankle DF    Other Standing Knee Exercises In // bars: down and back 3 times marching with UE support > none, cues for slowed and controlled and glute activation                       PT Short Term Goals - 03/17/21 1410       PT SHORT TERM GOAL #1   Title Pt will be independent with initial strengthening and balance HEP.    Baseline Pt reports exercises going pretty good.    Time 4    Period Weeks    Status Achieved    Target Date 03/17/21      PT SHORT TERM GOAL #2   Title Pt will decrease 5 x sit to stand from 28.59 sec to <20 sec for improved balance and functional mobility.    Baseline 02/16/21 28.59 sec. 03/17/21 16.63 sec    Time 4    Period Weeks    Status Achieved    Target Date 03/17/21      PT SHORT TERM GOAL #3   Title Pt will increase Berg from 38 to 42/56 for improved balance and  decreased falls risk.    Baseline 02/16/21 38/56. 03/17/21 44/56    Time 4    Period Weeks    Status Achieved    Target Date 03/17/21      PT SHORT TERM GOAL #4   Title Pt will ambulate >500' with RW on level surfaces mod I  for improved household and short community distances.    Baseline 03/17/21 230' with RW supervision    Time 4    Period Weeks    Status Not Met    Target Date 03/17/21               PT Long Term Goals - 04/02/21 1027       PT LONG TERM GOAL #1   Title Pt will be independent with progressive HEP for strengthening and balance to continue gains on own.    Time 8    Period Weeks    Status New      PT LONG TERM GOAL #2   Title Pt will increase gait speed to >0.68ms for improved gait safety.    Baseline 02/16/21 0.368m, .67 m/s on 10/21 with RW    Time 8    Period Weeks    Status Achieved      PT LONG TERM GOAL #3   Title Pt will increase Berg Balance to >45/56 for improved balance and decreased fall risk.    Baseline 02/16/21 38/56    Time 8    Period Weeks    Status New      PT LONG TERM GOAL #4   Title Pt will ambulate >800' on varied surfaces with LRAD mod I for improved short community distances.    Time 8    Period Weeks    Status New      PT LONG TERM GOAL #5   Title Pt will ambulate up/down 4 steps with 1 rail mod I to safely enter/exit home.    Time 8    Period Weeks    Status New      PT LONG TERM GOAL #6   Title Pt will increase FOTO from 50 to 60.    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 Trialed L  foot up brace during gait for improved foot clearance when using SPC with quad tip. Improvement during 1st lap, but pt does easily fatigue during 2nd lap with more difficulty with clearing foot due to weakness. Will continue to progress towards LTGs.    Personal Factors and Comorbidities Comorbidity 3+    Comorbidities arthritis, asthma, depression, HTN, neuropathy, suicide attempt, DM    Examination-Activity Limitations Locomotion Level;Transfers;Stand;Stairs;Squat    Examination-Participation Restrictions Community Activity;Driving;Yard Work;Cleaning    Stability/Clinical Decision Making Evolving/Moderate complexity    Rehab Potential Good    PT Frequency 2x / week   plus eval   PT Duration 8 weeks    PT Treatment/Interventions ADLs/Self Care Home Management;DME Instruction;Neuromuscular re-education;Manual techniques;Balance training;Therapeutic exercise;Therapeutic activities;Functional mobility training;Stair training;Gait training;Vestibular;Passive range of motion    PT Next Visit Plan Continue LLE strengthening and balance training. Gait training with walker as well as with cane with quad tip. Step-ups with LLE working on control. possibly L foot up brace?    Consulted and Agree with Plan of Care Patient  Patient will benefit from skilled therapeutic intervention in order to improve the following deficits and impairments:  Abnormal gait, Decreased balance, Decreased activity tolerance, Decreased mobility, Decreased strength, Decreased knowledge of use of DME, Decreased endurance, Impaired sensation  Visit Diagnosis: Unsteadiness on feet  Muscle weakness (generalized)  Other abnormalities of gait and mobility     Problem List Patient Active Problem List   Diagnosis Date Noted   Encounter for loop recorder check 01/02/2021   Loop Biotronik loop implant 12/16/2020 12/16/2020   Leukopenia    Controlled type 2 diabetes mellitus with hyperglycemia, with long-term  current use of insulin (HCC)    Vascular headache    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[place]) 90/21/1155   Embolic stroke ([place]) 20/80/2233   Sinus bradycardia on ECG 11/03/2020   Benign essential HTN    Pancytopenia ([place])    Stage 3b chronic kidney disease ([place])    Anemia    Labile blood glucose    Labile blood pressure    Diabetic peripheral neuropathy ([place])    Essential hypertension    AKI (acute kidney injury) ([place])    Ataxia, late effect of cerebrovascular disease    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[place]) 06/30/2020   Acute CVA (cerebrovascular accident) ([place]) 06/26/2020   CVA (cerebral vascular accident) ([place]) 06/25/2020   BMI 32.0-32.9,adult 06/03/2020   Left upper lobe pneumonia 03/30/2020   Chest pain 10/01/2019   MDD (major depressive disorder), recurrent severe, without psychosis ([place]) 05/09/2019   Suicide attempt ([place]) 05/09/2019   Cocaine abuse ([place]) 05/09/2019   Interstitial lung disease ([place]) 06/21/2016   COPD (chronic obstructive pulmonary disease) ([place]) 06/21/2016   Cough 06/21/2016   GERD (gastroesophageal reflux disease) 06/21/2016   Hypersomnia 06/21/2016   Tobacco user 06/21/2016   Rectal cancer ([place]) 01/30/2013    Arliss Journey, PT, DPT  04/02/2021, 11:58 AM  [facility] [address] [place], [place], [address] Phone: [phone]   Fax:  [phone]  Name: Erica [name] MRN: [id number] Date of Birth: 05/06/1956

## 2021-04-02 NOTE — Therapy (Signed)
[facility] [address] [place], [place], [address] Phone: [phone]   Fax:  [phone]  Occupational Therapy Treatment  Patient Details  Name: Erica [name] MRN: [id number] Date of Birth: 1955/06/27 Referring Provider (OT): Dr. Letta Pate   Encounter Date: 04/02/2021   OT End of Session - 04/02/21 1128     Visit Number 10    Number of Visits 25    Date for OT Re-Evaluation 05/19/21    Authorization Type Medicare/ Medicaid    Authorization - Visit Number 10    Progress Note Due on Visit 10    OT Start Time 1108    OT Stop Time 1143    OT Time Calculation (min) 35 min    Activity Tolerance Patient tolerated treatment well    Behavior During Therapy [facility] for tasks assessed/performed             Past Medical History:  Diagnosis Date   Arthritis    especially in shoulders   Asthma    Depression    Diabetes mellitus    Embolic stroke ([place]) 8/45/3646   GERD (gastroesophageal reflux disease)    Headache(784.0)    "mild"   Hypertension    Loop Biotronik loop implant 12/16/2020 12/16/2020   Mental disorder    depression   Neuropathy    feet    Pain    arthritis pain - takes tramadol as needed   Rectal polyp    very little bleeding with bowel movements- no pain   Stroke ([place]) 11/03/2020   Suicide attempt [facility])     Past Surgical History:  Procedure Laterality Date   ANAL RECTAL MANOMETRY N/A 07/13/2016   Procedure: ANO RECTAL MANOMETRY;  Surgeon: Leighton Ruff, MD;  [facility];  Service: Endoscopy;  Laterality: N/A;   CESAREAN SECTION     EUS N/A 11/21/2012   Procedure: LOWER ENDOSCOPIC ULTRASOUND (EUS);  Surgeon: Arta Silence, MD;  [facility];  Service: Endoscopy;  Laterality: N/A;   FLEXIBLE SIGMOIDOSCOPY N/A 11/21/2012   Procedure: FLEXIBLE SIGMOIDOSCOPY;  Surgeon: Arta Silence, MD;  [facility];  Service: Endoscopy;  Laterality: N/A;   FLEXIBLE SIGMOIDOSCOPY N/A  01/28/2013   Procedure: FLEXIBLE SIGMOIDOSCOPY;  Surgeon: Leighton Ruff, MD;  [facility];  Service: Endoscopy;  Laterality: N/A;   LAPAROSCOPIC LOW ANTERIOR RESECTION N/A 01/29/2013   Procedure: LAPAROSCOPIC LOW ANTERIOR RESECTION, Rigid Proctoscopy;  Surgeon: Leighton Ruff, MD;  [facility];  Service: General;  Laterality: N/A;   LAPAROSCOPIC SIGMOID COLECTOMY N/A 11/14/2012   Procedure: DIAGNOSTIC LAPAROSCOPY AND SIGMOIDMOIDOSCOPY ;  Surgeon: Rolm Bookbinder, MD;  [facility];  Service: General;  Laterality: N/A;   RECTAL ULTRASOUND N/A 07/13/2016   Procedure: RECTAL ULTRASOUND;  Surgeon: Leighton Ruff, MD;  [facility];  Service: Endoscopy;  Laterality: N/A;   TONSILLECTOMY     TONSILLECTOMY AND ADENOIDECTOMY      There were no vitals filed for this visit.   Subjective Assessment - 04/02/21 1123     Subjective  Pt reports L hand still get "real cold and numb"    Pertinent History Referred by [name] 01/21/21 , post-stroke  History of right parieto-occipital infarct. Most recent stroke 11/03/20. PMH: arthritis, asthma, depression, HTN, neuropathy, suicide attempt, DM    Patient Stated Goals do more stuff around the house    Currently in Pain? No/denies                   Treatment:  Kinesiotape applied to upper traps and correction piece applied at shoulder to facilitate shoulder and scapular retraction. Fine moto coordination activities with Purdue pegboard using LUE, min v.c for shoulder positioning, hotpack applied x 10 mins to left shoulder while perfroming, no adverse reacions. Education regarding applying jacket/ shirt over LUE first to minimize pain.               OT Education - 04/02/21 1453     Education Details kinesiotape wear, care and precautions    Person(s) Educated Patient    Methods Explanation;Handout    Comprehension Verbalized understanding              OT Short Term Goals - 03/30/21 1127        OT SHORT TERM GOAL #1   Title I with inital  HEP- 03/24/21    Time 4    Period Weeks    Status Achieved    Target Date 03/24/21      OT SHORT TERM GOAL #2   Title I with sensory precautions    Time 4    Period Weeks    Status Achieved   pt  verbalized understanding of precautions 03/23/21     OT SHORT TERM GOAL #3   Title Pt will perform basic home mangement with supervision    Time 4    Period Weeks    Status Achieved   pt reports doing laundry consistently from w/c level in apt 03/23/21     OT SHORT TERM GOAL #4   Title Pt will perform basic cooking with supervision demonstrating good safety awareness.    Time 4    Period Weeks    Status On-going   03/17/21, supervision and min v.c for walker safety     OT SHORT TERM GOAL #5   Title Pt will perform all basic ADLS modified independently.    Time 4    Period Weeks    Status Achieved   goes to beauty shop to get hair shampooed 03/10/21     OT SHORT TERM GOAL #6   Title Pt will retrieve a lightweight object at 115 shoulder flexion with LUE without dropping    Baseline 105*    Time 4    Period Weeks    Status Achieved   min v/c for decreasing abduction 03/23/21              OT Long Term Goals - 03/30/21 1126       OT LONG TERM GOAL #1   Title I with updated HEP. 05/19/21    Time 12    Period Weeks    Status On-going      OT LONG TERM GOAL #2   Title Pt will demonstrate improved fine motor coordination for ADLs as evidenced  by decreasing LUE 9 hole peg test to 28 secs without drops.    Time 12    Period Weeks    Status On-going      OT LONG TERM GOAL #3   Title Pt will demonstrate ability to retrieve a 2 lbs object from overhead shelf without drops with LUE.    Time 12    Period Weeks    Status On-going      OT LONG TERM GOAL #4   Title Pt will perform all basic ADLs modified Indpendently.    Time 12    Period Weeks    Status Achieved      OT LONG TERM GOAL #5  Title Pt will perfom basic home  management/ cooking mod I with good safety awareness.    Time 12    Period Weeks    Status On-going      OT LONG TERM GOAL #6   Title I with memory compensation strategies.    Time 12    Period Weeks    Status On-going                   Plan - 04/02/21 1131     Clinical Impression Statement For the reporting period of 02/24/21 to 04/02/21, pt has met 5 out of 6 short terms goals.  Pt is progressing towards remaining goals.  Pt continues to benefit from skilled occupational therapy to address safety, coordination, LUE pain and functional use, decreased strength, decreased functional mobility in order to maximize pt safety and independence with ADLs/IADLs.    OT Occupational Profile and History Detailed Assessment- Review of Records and additional review of physical, cognitive, psychosocial history related to current functional performance    Occupational performance deficits (Please refer to evaluation for details): ADL's;IADL's;Leisure    Body Structure / Function / Physical Skills ADL;Endurance;UE functional use;Balance;Flexibility;FMC;Gait;ROM;GMC;Coordination;Decreased knowledge of precautions;Sensation;IADL;Decreased knowledge of use of DME;Dexterity;Mobility    Cognitive Skills Attention;Memory    Rehab Potential Good    Clinical Decision Making Limited treatment options, no task modification necessary    Comorbidities Affecting Occupational Performance: May have comorbidities impacting occupational performance    Modification or Assistance to Complete Evaluation  No modification of tasks or assist necessary to complete eval    OT Frequency 2x / week   plus eval, will likely d/c after 8 weeks dependent on progress   OT Duration 12 weeks    OT Treatment/Interventions Self-care/ADL training;Ultrasound;Patient/family education;DME and/or AE instruction;Gait Training;Passive range of motion;Balance training;Fluidtherapy;Cryotherapy;Electrical Stimulation;Wellsite geologist;Therapeutic activities;Manual Therapy;Therapeutic exercise;Cognitive remediation/compensation;Neuromuscular education    Plan Assess kinesiotape benefit and continue to address L shoulder pain, coordination, and strength.    Consulted and Agree with Plan of Care Patient             Patient will benefit from skilled therapeutic intervention in order to improve the following deficits and impairments:   Body Structure / Function / Physical Skills: ADL, Endurance, UE functional use, Balance, Flexibility, FMC, Gait, ROM, GMC, Coordination, Decreased knowledge of precautions, Sensation, IADL, Decreased knowledge of use of DME, Dexterity, Mobility Cognitive Skills: Attention, Memory     Visit Diagnosis: Muscle weakness (generalized)  Frontal lobe and executive function deficit  Other lack of coordination  Other disturbances of skin sensation  Attention and concentration deficit    Problem List Patient Active Problem List   Diagnosis Date Noted   Encounter for loop recorder check 01/02/2021   Loop Biotronik loop implant 12/16/2020 12/16/2020   Leukopenia    Controlled type 2 diabetes mellitus with hyperglycemia, with long-term current use of insulin (HCC)    Vascular headache    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[place]) 67/05/4579   Embolic stroke ([place]) 99/83/3825   Sinus bradycardia on ECG 11/03/2020   Benign essential HTN    Pancytopenia (HCC)    Stage 3b chronic kidney disease ([place])    Anemia    Labile blood glucose    Labile blood pressure    Diabetic peripheral neuropathy (HCC)    Essential hypertension    AKI (acute kidney injury) ([place])    Ataxia, late effect of cerebrovascular disease    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   Acute CVA (cerebrovascular  accident) ([place]) 06/26/2020   CVA (cerebral vascular accident) ([place]) 06/25/2020   BMI 32.0-32.9,adult 06/03/2020   Left upper lobe pneumonia 03/30/2020   Chest pain 10/01/2019   MDD (major depressive disorder),  recurrent severe, without psychosis ([place]) 05/09/2019   Suicide attempt ([place]) 05/09/2019   Cocaine abuse ([place]) 05/09/2019   Interstitial lung disease ([place]) 06/21/2016   COPD (chronic obstructive pulmonary disease) ([place]) 06/21/2016   Cough 06/21/2016   GERD (gastroesophageal reflux disease) 06/21/2016   Hypersomnia 06/21/2016   Tobacco user 06/21/2016   Rectal cancer ([place]) 01/30/2013    Bowdy Bair, OT/L 04/02/2021, 2:54 PM  [facility] [address] [place], [place], [address] Phone: [phone]   Fax:  [phone]  Name: Erica [name] MRN: [id number] Date of Birth: 03-Dec-1955

## 2021-04-06 ENCOUNTER — Ambulatory Visit: Payer: Medicare HMO | Admitting: Occupational Therapy

## 2021-04-06 ENCOUNTER — Encounter: Payer: Self-pay | Admitting: Occupational Therapy

## 2021-04-06 ENCOUNTER — Other Ambulatory Visit: Payer: Self-pay

## 2021-04-06 ENCOUNTER — Ambulatory Visit: Payer: Medicare HMO

## 2021-04-06 VITALS — BP 109/72

## 2021-04-06 DIAGNOSIS — R41844 Frontal lobe and executive function deficit: Secondary | ICD-10-CM

## 2021-04-06 DIAGNOSIS — R2689 Other abnormalities of gait and mobility: Secondary | ICD-10-CM

## 2021-04-06 DIAGNOSIS — R2681 Unsteadiness on feet: Secondary | ICD-10-CM

## 2021-04-06 DIAGNOSIS — R41840 Attention and concentration deficit: Secondary | ICD-10-CM

## 2021-04-06 DIAGNOSIS — R208 Other disturbances of skin sensation: Secondary | ICD-10-CM

## 2021-04-06 DIAGNOSIS — M6281 Muscle weakness (generalized): Secondary | ICD-10-CM

## 2021-04-06 DIAGNOSIS — R278 Other lack of coordination: Secondary | ICD-10-CM

## 2021-04-06 NOTE — Therapy (Signed)
[facility] [address] [place], [place], [address] Phone: [phone]   Fax:  [phone]  Occupational Therapy Treatment  Patient Details  Name: Erica [name] MRN: [id number] Date of Birth: 04-17-1956 Referring Provider (OT): Dr. Letta Pate   Encounter Date: 04/06/2021   OT End of Session - 04/06/21 1250     Visit Number 11    Number of Visits 25    Date for OT Re-Evaluation 05/19/21    Authorization Type Medicare/ Medicaid    Authorization - Visit Number 10    Progress Note Due on Visit 10    OT Start Time 1233    OT Stop Time 1315    OT Time Calculation (min) 42 min    Activity Tolerance Patient tolerated treatment well    Behavior During Therapy [facility] for tasks assessed/performed             Past Medical History:  Diagnosis Date   Arthritis    especially in shoulders   Asthma    Depression    Diabetes mellitus    Embolic stroke ([place]) 10/07/621   GERD (gastroesophageal reflux disease)    Headache(784.0)    "mild"   Hypertension    Loop Biotronik loop implant 12/16/2020 12/16/2020   Mental disorder    depression   Neuropathy    feet    Pain    arthritis pain - takes tramadol as needed   Rectal polyp    very little bleeding with bowel movements- no pain   Stroke ([place]) 11/03/2020   Suicide attempt [facility])     Past Surgical History:  Procedure Laterality Date   ANAL RECTAL MANOMETRY N/A 07/13/2016   Procedure: ANO RECTAL MANOMETRY;  Surgeon: Leighton Ruff, MD;  [facility]: [facility];  Service: Endoscopy;  Laterality: N/A;   CESAREAN SECTION     EUS N/A 11/21/2012   Procedure: LOWER ENDOSCOPIC ULTRASOUND (EUS);  Surgeon: Arta Silence, MD;  Location: [facility];  Service: Endoscopy;  Laterality: N/A;   FLEXIBLE SIGMOIDOSCOPY N/A 11/21/2012   Procedure: FLEXIBLE SIGMOIDOSCOPY;  Surgeon: Arta Silence, MD;  [facility];  Service: Endoscopy;  Laterality: N/A;   FLEXIBLE SIGMOIDOSCOPY N/A  01/28/2013   Procedure: FLEXIBLE SIGMOIDOSCOPY;  Surgeon: Leighton Ruff, MD;  [facility];  Service: Endoscopy;  Laterality: N/A;   LAPAROSCOPIC LOW ANTERIOR RESECTION N/A 01/29/2013   Procedure: LAPAROSCOPIC LOW ANTERIOR RESECTION, Rigid Proctoscopy;  Surgeon: Leighton Ruff, MD;  [facility]: [facility];  Service: General;  Laterality: N/A;   LAPAROSCOPIC SIGMOID COLECTOMY N/A 11/14/2012   Procedure: DIAGNOSTIC LAPAROSCOPY AND SIGMOIDMOIDOSCOPY ;  Surgeon: Rolm Bookbinder, MD;  [facility];  Service: General;  Laterality: N/A;   RECTAL ULTRASOUND N/A 07/13/2016   Procedure: RECTAL ULTRASOUND;  Surgeon: Leighton Ruff, MD;  [facility];  Service: Endoscopy;  Laterality: N/A;   TONSILLECTOMY     TONSILLECTOMY AND ADENOIDECTOMY      Vitals:   04/06/21 1239  BP: 109/72     Subjective Assessment - 04/06/21 1326     Subjective  Pt reports felling a little dizzy after sit-stand    Pertinent History Referred by [name] 01/21/21 , post-stroke  History of right parieto-occipital infarct. Most recent stroke 11/03/20. PMH: arthritis, asthma, depression, HTN, neuropathy, suicide attempt, DM    Patient Stated Goals do more stuff around the house    Currently in Pain? Yes    Pain Score 2     Pain Location Shoulder    Pain Orientation Left  Pain Descriptors / Indicators Aching    Pain Type Acute pain    Pain Onset 1 to 4 weeks ago    Pain Frequency Constant    Aggravating Factors  overhead reach    Pain Relieving Factors kinesiotape                Treatment: Pt reports feeling "down" she sees a counselor and reports she discusses with her.  Pt was dizzy on arrival, BP monitored. Therapist asked pt if she had thoughts of hurting herself and she said no it's not like that. Pt was instructed to talk with someone if she does experience thought of suicide or hurting herself. Pt verbalized understanding. Pt report frustration with her son, however she states she is  discussing with MD. Pt ran out of meds for her depression, she is pursuing pick up today. Ambulating to perform environmental scanning, pt located all items but min v.c for walker safety. Placing O'connor pegs in pegboard with LUE for increased fine motor coordination, min v.c for positioning, removing with tweezers for sustained pinch. Bolt board for increased fine motor coordination and use of LUE, min difficulty/ v.c                     OT Short Term Goals - 03/30/21 1127       OT SHORT TERM GOAL #1   Title I with inital  HEP- 03/24/21    Time 4    Period Weeks    Status Achieved    Target Date 03/24/21      OT SHORT TERM GOAL #2   Title I with sensory precautions    Time 4    Period Weeks    Status Achieved   pt  verbalized understanding of precautions 03/23/21     OT SHORT TERM GOAL #3   Title Pt will perform basic home mangement with supervision    Time 4    Period Weeks    Status Achieved   pt reports doing laundry consistently from w/c level in apt 03/23/21     OT SHORT TERM GOAL #4   Title Pt will perform basic cooking with supervision demonstrating good safety awareness.    Time 4    Period Weeks    Status On-going   03/17/21, supervision and min v.c for walker safety     OT SHORT TERM GOAL #5   Title Pt will perform all basic ADLS modified independently.    Time 4    Period Weeks    Status Achieved   goes to beauty shop to get hair shampooed 03/10/21     OT SHORT TERM GOAL #6   Title Pt will retrieve a lightweight object at 115 shoulder flexion with LUE without dropping    Baseline 105*    Time 4    Period Weeks    Status Achieved   min v/c for decreasing abduction 03/23/21              OT Long Term Goals - 03/30/21 1126       OT LONG TERM GOAL #1   Title I with updated HEP. 05/19/21    Time 12    Period Weeks    Status On-going      OT LONG TERM GOAL #2   Title Pt will demonstrate improved fine motor coordination for ADLs as  evidenced  by decreasing LUE 9 hole peg test to 28 secs without drops.    Time  12    Period Weeks    Status On-going      OT LONG TERM GOAL #3   Title Pt will demonstrate ability to retrieve a 2 lbs object from overhead shelf without drops with LUE.    Time 12    Period Weeks    Status On-going      OT LONG TERM GOAL #4   Title Pt will perform all basic ADLs modified Indpendently.    Time 12    Period Weeks    Status Achieved      OT LONG TERM GOAL #5   Title Pt will perfom basic home management/ cooking mod I with good safety awareness.    Time 12    Period Weeks    Status On-going      OT LONG TERM GOAL #6   Title I with memory compensation strategies.    Time 12    Period Weeks    Status On-going                   Plan - 04/06/21 1328     Clinical Impression Statement Pt is progressing towards goals. She agrees with plans to continue therapy after next week for an additional 4 weeks.    OT Occupational Profile and History Detailed Assessment- Review of Records and additional review of physical, cognitive, psychosocial history related to current functional performance    Occupational performance deficits (Please refer to evaluation for details): ADL's;IADL's;Leisure    Body Structure / Function / Physical Skills ADL;Endurance;UE functional use;Balance;Flexibility;FMC;Gait;ROM;GMC;Coordination;Decreased knowledge of precautions;Sensation;IADL;Decreased knowledge of use of DME;Dexterity;Mobility    Cognitive Skills Attention;Memory    Rehab Potential Good    Clinical Decision Making Limited treatment options, no task modification necessary    Comorbidities Affecting Occupational Performance: May have comorbidities impacting occupational performance    Modification or Assistance to Complete Evaluation  No modification of tasks or assist necessary to complete eval    OT Frequency 2x / week   plus eval, will likely d/c after 8 weeks dependent on progress   OT Duration  12 weeks    OT Treatment/Interventions Self-care/ADL training;Ultrasound;Patient/family education;DME and/or AE instruction;Gait Training;Passive range of motion;Balance training;Fluidtherapy;Cryotherapy;Electrical Stimulation;Therapist, nutritional;Therapeutic activities;Manual Therapy;Therapeutic exercise;Cognitive remediation/compensation;Neuromuscular education    Plan continue to address L shoulder pain, coordination, and strength.    Consulted and Agree with Plan of Care Patient             Patient will benefit from skilled therapeutic intervention in order to improve the following deficits and impairments:   Body Structure / Function / Physical Skills: ADL, Endurance, UE functional use, Balance, Flexibility, FMC, Gait, ROM, GMC, Coordination, Decreased knowledge of precautions, Sensation, IADL, Decreased knowledge of use of DME, Dexterity, Mobility Cognitive Skills: Attention, Memory     Visit Diagnosis: Unsteadiness on feet  Muscle weakness (generalized)  Other abnormalities of gait and mobility  Frontal lobe and executive function deficit  Other lack of coordination  Other disturbances of skin sensation  Attention and concentration deficit    Problem List Patient Active Problem List   Diagnosis Date Noted   Encounter for loop recorder check 01/02/2021   Loop Biotronik loop implant 12/16/2020 12/16/2020   Leukopenia    Controlled type 2 diabetes mellitus with hyperglycemia, with long-term current use of insulin (HCC)    Vascular headache    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[place]) 57/50/5183   Embolic stroke ([place]) 35/82/5189   Sinus bradycardia on ECG 11/03/2020   Benign essential HTN  Pancytopenia ([place])    Stage 3b chronic kidney disease ([place])    Anemia    Labile blood glucose    Labile blood pressure    Diabetic peripheral neuropathy ([place])    Essential hypertension    AKI (acute kidney injury) ([place])    Ataxia, late effect of cerebrovascular disease     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[place]) 06/30/2020   Acute CVA (cerebrovascular accident) ([place]) 06/26/2020   CVA (cerebral vascular accident) ([place]) 06/25/2020   BMI 32.0-32.9,adult 06/03/2020   Left upper lobe pneumonia 03/30/2020   Chest pain 10/01/2019   MDD (major depressive disorder), recurrent severe, without psychosis ([place]) 05/09/2019   Suicide attempt ([place]) 05/09/2019   Cocaine abuse ([place]) 05/09/2019   Interstitial lung disease ([place]) 06/21/2016   COPD (chronic obstructive pulmonary disease) ([place]) 06/21/2016   Cough 06/21/2016   GERD (gastroesophageal reflux disease) 06/21/2016   Hypersomnia 06/21/2016   Tobacco user 06/21/2016   Rectal cancer ([place]) 01/30/2013    Kegan Mckeithan, OT/L 04/06/2021, 3:44 PM  [facility] [address] [place], [place], [address] Phone: [phone]   Fax:  [phone]  Name: FELESIA STAHLECKER MRN: [id number] Date of Birth: 14-Nov-1955

## 2021-04-06 NOTE — Therapy (Signed)
[facility] [address] [place], [place], [address] Phone: [phone]   Fax:  [phone]  Physical Therapy Treatment  Patient Details  Name: Erica [name] MRN: [id number] Date of Birth: 1956/04/10 Referring Provider (PT): [name]   Encounter Date: 04/06/2021   PT End of Session - 04/06/21 1316     Visit Number 11    Number of Visits 17    Date for PT Re-Evaluation 04/16/21    Authorization Type [employer] medicare    Authorization Time Period 17 visits 9/6-11/4/22    Authorization - Visit Number 11    Authorization - Number of Visits 17    Progress Note Due on Visit 10    PT Start Time 1315    PT Stop Time 1400    PT Time Calculation (min) 45 min    Equipment Utilized During Treatment Gait belt    Activity Tolerance Patient tolerated treatment well    Behavior During Therapy [facility] for tasks assessed/performed             Past Medical History:  Diagnosis Date   Arthritis    especially in shoulders   Asthma    Depression    Diabetes mellitus    Embolic stroke ([place]) 07/17/5595   GERD (gastroesophageal reflux disease)    Headache(784.0)    "mild"   Hypertension    Loop Biotronik loop implant 12/16/2020 12/16/2020   Mental disorder    depression   Neuropathy    feet    Pain    arthritis pain - takes tramadol as needed   Rectal polyp    very little bleeding with bowel movements- no pain   Stroke ([place]) 11/03/2020   Suicide attempt [facility])     Past Surgical History:  Procedure Laterality Date   ANAL RECTAL MANOMETRY N/A 07/13/2016   Procedure: ANO RECTAL MANOMETRY;  Surgeon: Leighton Ruff, MD;  [facility];  Service: Endoscopy;  Laterality: N/A;   CESAREAN SECTION     EUS N/A 11/21/2012   Procedure: LOWER ENDOSCOPIC ULTRASOUND (EUS);  Surgeon: Arta Silence, MD;  [facility];  Service: Endoscopy;  Laterality: N/A;   FLEXIBLE SIGMOIDOSCOPY N/A 11/21/2012   Procedure: FLEXIBLE  SIGMOIDOSCOPY;  Surgeon: Arta Silence, MD;  [facility];  Service: Endoscopy;  Laterality: N/A;   FLEXIBLE SIGMOIDOSCOPY N/A 01/28/2013   Procedure: FLEXIBLE SIGMOIDOSCOPY;  Surgeon: Leighton Ruff, MD;  [facility];  Service: Endoscopy;  Laterality: N/A;   LAPAROSCOPIC LOW ANTERIOR RESECTION N/A 01/29/2013   Procedure: LAPAROSCOPIC LOW ANTERIOR RESECTION, Rigid Proctoscopy;  Surgeon: Leighton Ruff, MD;  [facility];  Service: General;  Laterality: N/A;   LAPAROSCOPIC SIGMOID COLECTOMY N/A 11/14/2012   Procedure: DIAGNOSTIC LAPAROSCOPY AND SIGMOIDMOIDOSCOPY ;  Surgeon: Rolm Bookbinder, MD;  [facility];  Service: General;  Laterality: N/A;   RECTAL ULTRASOUND N/A 07/13/2016   Procedure: RECTAL ULTRASOUND;  Surgeon: Leighton Ruff, MD;  [facility];  Service: Endoscopy;  Laterality: N/A;   TONSILLECTOMY     TONSILLECTOMY AND ADENOIDECTOMY      There were no vitals filed for this visit.   Subjective Assessment - 04/06/21 1317     Subjective Pt reports she ran out of her antidepressant a couple days ago. Is going to get some more soon. Feeling  a little more down.    Pertinent History arthritis, asthma, depression, HTN, neuropathy, suicide attempt, DM, CVA    Patient Stated Goals Pt would like to be able to walk  and use her hands better.    Currently in Pain? Yes    Pain Score 2     Pain Location Shoulder    Pain Orientation Left    Pain Descriptors / Indicators Aching    Pain Type Chronic pain    Pain Onset 1 to 4 weeks ago    Pain Frequency Intermittent                               [facility] Adult PT Treatment/Exercise - 04/06/21 1319       Ambulation/Gait   Ambulation/Gait Yes    Ambulation/Gait Assistance 5: Supervision    Ambulation/Gait Assistance Details 1st lap without foot up brace then added the foot-up brace on left x 2 laps with walker. Pt reported feeling like it was easier to clear her foot. Pt able to keep  walker moving. Then switched to [facility] with quad tip for 230' more. Pt reports that left leg gets tired as goes on.    Ambulation Distance (Feet) 345 Feet   230' with SPC with quad tip   Assistive device Rolling walker   SPC with quad tip, left foot-up brace   Gait Pattern Step-through pattern;Decreased stance time - left;Decreased hip/knee flexion - left    Ambulation Surface Level;Indoor      Neuro Re-ed    Neuro Re-ed Details  At bottom step: step-ups x 10 on LLE with 1 UE support forward and then lateral with cues to stand up tall through leg. In // bars: alternating toe taps on cone x 10 with fingertip support CGA. Pt had decreased stance time on left. Marching gait in // bars 10' x 4 with light UE support to none.      Knee/Hip Exercises: Aerobic   Other Aerobic SciFit level 4 BUE and BLE x 6 min level 4 for strengthening and improving activity tolerance at end of session.                     PT Education - 04/06/21 1858     Education Details Gave information for foot-up brace purchase.              PT Short Term Goals - 03/17/21 1410       PT SHORT TERM GOAL #1   Title Pt will be independent with initial strengthening and balance HEP.    Baseline Pt reports exercises going pretty good.    Time 4    Period Weeks    Status Achieved    Target Date 03/17/21      PT SHORT TERM GOAL #2   Title Pt will decrease 5 x sit to stand from 28.59 sec to <20 sec for improved balance and functional mobility.    Baseline 02/16/21 28.59 sec. 03/17/21 16.63 sec    Time 4    Period Weeks    Status Achieved    Target Date 03/17/21      PT SHORT TERM GOAL #3   Title Pt will increase Berg from 38 to 42/56 for improved balance and decreased falls risk.    Baseline 02/16/21 38/56. 03/17/21 44/56    Time 4    Period Weeks    Status Achieved    Target Date 03/17/21      PT SHORT TERM GOAL #4   Title Pt will ambulate >500' with RW on level surfaces mod I for improved household and  short community  distances.    Baseline 03/17/21 230' with RW supervision    Time 4    Period Weeks    Status Not Met    Target Date 03/17/21               PT Long Term Goals - 04/02/21 1027       PT LONG TERM GOAL #1   Title Pt will be independent with progressive HEP for strengthening and balance to continue gains on own.    Time 8    Period Weeks    Status New      PT LONG TERM GOAL #2   Title Pt will increase gait speed to >0.26ms for improved gait safety.    Baseline 02/16/21 0.365m, .67 m/s on 10/21 with RW    Time 8    Period Weeks    Status Achieved      PT LONG TERM GOAL #3   Title Pt will increase Berg Balance to >45/56 for improved balance and decreased fall risk.    Baseline 02/16/21 38/56    Time 8    Period Weeks    Status New      PT LONG TERM GOAL #4   Title Pt will ambulate >800' on varied surfaces with LRAD mod I for improved short community distances.    Time 8    Period Weeks    Status New      PT LONG TERM GOAL #5   Title Pt will ambulate up/down 4 steps with 1 rail mod I to safely enter/exit home.    Time 8    Period Weeks    Status New      PT LONG TERM GOAL #6   Title Pt will increase FOTO from 50 to 60.    Baseline 02/16/21 50    Time 8    Period Weeks    Status New                   Plan - 04/06/21 1856     Clinical Impression Statement PT continued to work on decreasing AD down to cane. Pt does fatigue with longer distances. Foot-up brace helps with foot clearance as fatigues.    Personal Factors and Comorbidities Comorbidity 3+    Comorbidities arthritis, asthma, depression, HTN, neuropathy, suicide attempt, DM    Examination-Activity Limitations Locomotion Level;Transfers;Stand;Stairs;Squat    Examination-Participation Restrictions Community Activity;Driving;Yard Work;Cleaning    Stability/Clinical Decision Making Evolving/Moderate complexity    Rehab Potential Good    PT Frequency 2x / week   plus eval   PT Duration 8  weeks    PT Treatment/Interventions ADLs/Self Care Home Management;DME Instruction;Neuromuscular re-education;Manual techniques;Balance training;Therapeutic exercise;Therapeutic activities;Functional mobility training;Stair training;Gait training;Vestibular;Passive range of motion    PT Next Visit Plan Continue LLE strengthening and balance training. Gait training with walker as well as with cane with quad tip. Step-ups with LLE working on control. Did pt get foot up brace?    Consulted and Agree with Plan of Care Patient             Patient will benefit from skilled therapeutic intervention in order to improve the following deficits and impairments:  Abnormal gait, Decreased balance, Decreased activity tolerance, Decreased mobility, Decreased strength, Decreased knowledge of use of DME, Decreased endurance, Impaired sensation  Visit Diagnosis: Other abnormalities of gait and mobility  Muscle weakness (generalized)  Unsteadiness on feet     Problem List Patient Active Problem List   Diagnosis Date Noted  Encounter for loop recorder check 01/02/2021   Loop Biotronik loop implant 12/16/2020 12/16/2020   Leukopenia    Controlled type 2 diabetes mellitus with hyperglycemia, with long-term current use of insulin (HCC)    Vascular headache    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[place]) 85/46/2703   Embolic stroke ([place]) 50/02/3817   Sinus bradycardia on ECG 11/03/2020   Benign essential HTN    Pancytopenia (HCC)    Stage 3b chronic kidney disease ([place])    Anemia    Labile blood glucose    Labile blood pressure    Diabetic peripheral neuropathy ([place])    Essential hypertension    AKI (acute kidney injury) ([place])    Ataxia, late effect of cerebrovascular disease    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[place]) 06/30/2020   Acute CVA (cerebrovascular accident) ([place]) 06/26/2020   CVA (cerebral vascular accident) ([place]) 06/25/2020   BMI 32.0-32.9,adult 06/03/2020   Left upper lobe pneumonia 03/30/2020   Chest  pain 10/01/2019   MDD (major depressive disorder), recurrent severe, without psychosis ([place]) 05/09/2019   Suicide attempt ([place]) 05/09/2019   Cocaine abuse ([place]) 05/09/2019   Interstitial lung disease ([place]) 06/21/2016   COPD (chronic obstructive pulmonary disease) ([place]) 06/21/2016   Cough 06/21/2016   GERD (gastroesophageal reflux disease) 06/21/2016   Hypersomnia 06/21/2016   Tobacco user 06/21/2016   Rectal cancer ([place]) 01/30/2013    Electa Sniff, PT, DPT, NCS 04/06/2021, 6:59 PM  [facility] [address] [place], [place], [address] Phone: [phone]   Fax:  [phone]  Name: Erica [name] MRN: [id number] Date of Birth: [birth year]

## 2021-04-07 ENCOUNTER — Ambulatory Visit: Payer: Medicare HMO | Admitting: Internal Medicine

## 2021-04-08 ENCOUNTER — Ambulatory Visit: Payer: Medicare HMO | Admitting: Occupational Therapy

## 2021-04-08 ENCOUNTER — Ambulatory Visit: Payer: Medicare HMO

## 2021-04-12 ENCOUNTER — Other Ambulatory Visit: Payer: Self-pay | Admitting: Primary Care

## 2021-04-12 DIAGNOSIS — J84112 Idiopathic pulmonary fibrosis: Secondary | ICD-10-CM

## 2021-04-13 ENCOUNTER — Ambulatory Visit: Payer: Medicare HMO

## 2021-04-13 ENCOUNTER — Other Ambulatory Visit: Payer: Self-pay

## 2021-04-13 ENCOUNTER — Ambulatory Visit: Payer: Medicare HMO | Admitting: Occupational Therapy

## 2021-04-13 VITALS — BP 143/74

## 2021-04-13 DIAGNOSIS — R2681 Unsteadiness on feet: Secondary | ICD-10-CM

## 2021-04-13 DIAGNOSIS — R2689 Other abnormalities of gait and mobility: Secondary | ICD-10-CM

## 2021-04-13 DIAGNOSIS — R208 Other disturbances of skin sensation: Secondary | ICD-10-CM

## 2021-04-13 DIAGNOSIS — I69398 Other sequelae of cerebral infarction: Secondary | ICD-10-CM | POA: Diagnosis present

## 2021-04-13 DIAGNOSIS — R41844 Frontal lobe and executive function deficit: Secondary | ICD-10-CM | POA: Insufficient documentation

## 2021-04-13 DIAGNOSIS — R41840 Attention and concentration deficit: Secondary | ICD-10-CM

## 2021-04-13 DIAGNOSIS — R278 Other lack of coordination: Secondary | ICD-10-CM

## 2021-04-13 DIAGNOSIS — I6389 Other cerebral infarction: Secondary | ICD-10-CM | POA: Insufficient documentation

## 2021-04-13 DIAGNOSIS — M6281 Muscle weakness (generalized): Secondary | ICD-10-CM

## 2021-04-13 DIAGNOSIS — R269 Unspecified abnormalities of gait and mobility: Secondary | ICD-10-CM | POA: Diagnosis present

## 2021-04-13 DIAGNOSIS — I6381 Other cerebral infarction due to occlusion or stenosis of small artery: Secondary | ICD-10-CM | POA: Diagnosis present

## 2021-04-13 NOTE — Therapy (Signed)
[facility] [address] [place], [place], [address] Phone: [phone]   Fax:  [phone]  Physical Therapy Treatment  Patient Details  Name: Erica [name] MRN: [id number] Date of Birth: [birth year] Referring Provider (PT): [name]   Encounter Date: 04/13/2021   PT End of Session - 04/13/21 1457     Visit Number 12    Number of Visits 17    Date for PT Re-Evaluation 04/16/21    Authorization Type Human Medicare    Authorization Time Period 17 visits 9/16-11/4/22    Authorization - Visit Number 12    Authorization - Number of Visits 17    Progress Note Due on Visit 10    PT Start Time 1315    PT Stop Time 1358    PT Time Calculation (min) 43 min    Equipment Utilized During Treatment Gait belt    Activity Tolerance Patient tolerated treatment well    Behavior During Therapy [facility] for tasks assessed/performed             Past Medical History:  Diagnosis Date   Arthritis    especially in shoulders   Asthma    Depression    Diabetes mellitus    Embolic stroke ([place]) 6/43/3295   GERD (gastroesophageal reflux disease)    Headache(784.0)    "mild"   Hypertension    Loop Biotronik loop implant 12/16/2020 12/16/2020   Mental disorder    depression   Neuropathy    feet    Pain    arthritis pain - takes tramadol as needed   Rectal polyp    very little bleeding with bowel movements- no pain   Stroke ([place]) 11/03/2020   Suicide attempt [facility])     Past Surgical History:  Procedure Laterality Date   ANAL RECTAL MANOMETRY N/A 07/13/2016   Procedure: ANO RECTAL MANOMETRY;  Surgeon: Leighton Ruff, MD;  [facility] END[facility];  Service: Endoscopy;  Laterality: N/A;   CESAREAN SECTION     EUS N/A 11/21/2012   Procedure: LOWER ENDOSCOPIC ULTRASOUND (EUS);  Surgeon: Arta Silence, MD;  [facility]: [facility];  Service: Endoscopy;  Laterality: N/A;   FLEXIBLE SIGMOIDOSCOPY N/A 11/21/2012   Procedure: FLEXIBLE  SIGMOIDOSCOPY;  Surgeon: Arta Silence, MD;  [facility];  Service: Endoscopy;  Laterality: N/A;   FLEXIBLE SIGMOIDOSCOPY N/A 01/28/2013   Procedure: FLEXIBLE SIGMOIDOSCOPY;  Surgeon: Leighton Ruff, MD;  [facility];  Service: Endoscopy;  Laterality: N/A;   LAPAROSCOPIC LOW ANTERIOR RESECTION N/A 01/29/2013   Procedure: LAPAROSCOPIC LOW ANTERIOR RESECTION, Rigid Proctoscopy;  Surgeon: Leighton Ruff, MD;  [facility] ORS;  Service: General;  Laterality: N/A;   LAPAROSCOPIC SIGMOID COLECTOMY N/A 11/14/2012   Procedure: DIAGNOSTIC LAPAROSCOPY AND SIGMOIDMOIDOSCOPY ;  Surgeon: Rolm Bookbinder, MD;  [facility];  Service: General;  Laterality: N/A;   RECTAL ULTRASOUND N/A 07/13/2016   Procedure: RECTAL ULTRASOUND;  Surgeon: Leighton Ruff, MD;  [facility];  Service: Endoscopy;  Laterality: N/A;   TONSILLECTOMY     TONSILLECTOMY AND ADENOIDECTOMY      There were no vitals filed for this visit.   Subjective Assessment - 04/13/21 1318     Subjective Pt states that she is doing okay. She reports being extra tired today due to going out and about with family this weekend.    Currently in Pain? Yes    Pain Score 3     Pain Location Shoulder    Pain Orientation Left    Pain  Descriptors / Indicators Aching    Pain Type Chronic pain    Pain Onset 1 to 4 weeks ago                               [facility] Adult PT Treatment/Exercise - 04/13/21 1333       Ambulation/Gait   Ambulation/Gait Yes    Ambulation/Gait Assistance 5: Supervision    Ambulation/Gait Assistance Details used quad tip cane    Ambulation Distance (Feet) 550 Feet   Outside w/ quad tip cane, came in front door   Assistive device Small based quad cane    Gait Pattern Step-through pattern;Decreased stance time - left;Decreased hip/knee flexion - left    Ambulation Surface Unlevel;Outdoor;Paved    Gait velocity 14.19 seconds= .67 m/s   w/ quad tip cane     Knee/Hip Exercises:  Standing   Rocker Board Other (comment)   Pt was placed on rocker board A&P 1x30" EO/EC each. Pt did A&P rocker board taps x10. Pt had increased diffuclty with tapping board towards the front. Pt did 2x30' w/ rocker board laterally w/ finger touch. PT used min guard.   Rocker Board Limitations Pt had difficulty maintaing balance with rocker board laterally.                 Balance Exercises - 04/13/21 0001       Balance Exercises: Standing   Retro Gait 3 reps (P)    3 laps at // with focused on increased hip extension. PT used min guard. PT used verbal cues to remind pt to take bigger steps and keep upright posture.   Sidestepping Other (comment) (P)    Side stepping over hurdles x3 laps in //. Pt used single arm assist on // bars. PT provided verbal cues to take big steps to clear hurdle and to allow enough room for trailing foot.   Step Over Hurdles / Cones Pt did 3 laps of hurdles at // focusing on taking big reciprocal steps PT provided supervision and allowed pt single arm assist. (P)                   PT Short Term Goals - 03/17/21 1410       PT SHORT TERM GOAL #1   Title Pt will be independent with initial strengthening and balance HEP.    Baseline Pt reports exercises going pretty good.    Time 4    Period Weeks    Status Achieved    Target Date 03/17/21      PT SHORT TERM GOAL #2   Title Pt will decrease 5 x sit to stand from 28.59 sec to <20 sec for improved balance and functional mobility.    Baseline 02/16/21 28.59 sec. 03/17/21 16.63 sec    Time 4    Period Weeks    Status Achieved    Target Date 03/17/21      PT SHORT TERM GOAL #3   Title Pt will increase Berg from 38 to 42/56 for improved balance and decreased falls risk.    Baseline 02/16/21 38/56. 03/17/21 44/56    Time 4    Period Weeks    Status Achieved    Target Date 03/17/21      PT SHORT TERM GOAL #4   Title Pt will ambulate >500' with RW on level surfaces mod I for improved household and  short community distances.  Baseline 03/17/21 230' with RW supervision    Time 4    Period Weeks    Status Not Met    Target Date 03/17/21               PT Long Term Goals - 04/13/21 1330       PT LONG TERM GOAL #1   Title Pt will be independent with progressive HEP for strengthening and balance to continue gains on own.    Time 8    Period Weeks    Status New      PT LONG TERM GOAL #2   Title Pt will increase gait speed to >0.46ms for improved gait safety.    Baseline 02/16/21 0.321m, .67 m/s on 10/21 with RW,    Time 8    Period Weeks    Status Achieved      PT LONG TERM GOAL #3   Title Pt will increase Berg Balance to >45/56 for improved balance and decreased fall risk.    Baseline 02/16/21 38/56    Time 8    Period Weeks    Status New      PT LONG TERM GOAL #4   Title Pt will ambulate >800' on varied surfaces with LRAD mod I for improved short community distances.    Baseline 11/1- pt ambulated 550' on varied surface w/ quad tip cane    Time 8    Period Weeks    Status Partially Met      PT LONG TERM GOAL #5   Title Pt will ambulate up/down 4 steps with 1 rail mod I to safely enter/exit home.    Time 8    Period Weeks    Status New      PT LONG TERM GOAL #6   Title Pt will increase FOTO from 50 to 60.    Baseline 02/16/21 50    Time 8    Period Weeks    Status New                   Plan - 04/13/21 1501     Clinical Impression Statement Pt presents to today's session with reports of increased fatigue/soreness in BLE due to self-reports of being out and about more this past weekend. PT continued to work pt's with LTG. Pt was able to ambulate .67 m/s with quad tip cane demonstrating progress in gait speed. Pt was able to ambulate 550' outdoors with quad tip cane showing improved community ambulation. Pt is still limited by fatigue and limited physical activity tolerance. Pt focused session on improving pt's balance and gait to facilitate improved  functional mobility and further improve comunity ambultation. Pt tolerated today's session well with no adverse s/s and appropriate fatigue at the conclusion of therapy. PT will continue to progress pt as tolerated with activities to achieve pt's LTG.    Personal Factors and Comorbidities Comorbidity 3+    Comorbidities arthritis, asthma, depression, HTN, neuropathy, suicide attempt, DM    Examination-Activity Limitations Locomotion Level;Transfers;Stand;Stairs;Squat    Examination-Participation Restrictions Community Activity;Driving;Yard Work;Cleaning    Stability/Clinical Decision Making Evolving/Moderate complexity    Rehab Potential Good    PT Frequency 2x / week   plus eval   PT Duration 8 weeks    PT Treatment/Interventions ADLs/Self Care Home Management;DME Instruction;Neuromuscular re-education;Manual techniques;Balance training;Therapeutic exercise;Therapeutic activities;Functional mobility training;Stair training;Gait training;Vestibular;Passive range of motion    PT Next Visit Plan Complete re-cert, continue LLE strengthening and balance training. Gait training with walker  as well as with cane with quad tip. Step-ups with LLE working on control. Did pt get foot up brace?    Consulted and Agree with Plan of Care Patient             Patient will benefit from skilled therapeutic intervention in order to improve the following deficits and impairments:  Abnormal gait, Decreased balance, Decreased activity tolerance, Decreased mobility, Decreased strength, Decreased knowledge of use of DME, Decreased endurance, Impaired sensation  Visit Diagnosis: Other abnormalities of gait and mobility  Muscle weakness (generalized)  Unsteadiness on feet     Problem List Patient Active Problem List   Diagnosis Date Noted   Encounter for loop recorder check 01/02/2021   Loop Biotronik loop implant 12/16/2020 12/16/2020   Leukopenia    Controlled type 2 diabetes mellitus with hyperglycemia,  with long-term current use of insulin (HCC)    Vascular headache    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[place]) 06/30/2020   Acute CVA (cerebrovascular accident) ([place]) 06/26/2020   CVA (cerebral vascular accident) ([place]) 06/25/2020   BMI 32.0-32.9,adult 06/03/2020   Left upper lobe pneumonia 03/30/2020   Chest pain 10/01/2019   MDD (major depressive disorder), recurrent severe, without psychosis ([place]) 05/09/2019   Suicide attempt ([place]) 05/09/2019   Cocaine abuse ([place]) 05/09/2019   Interstitial lung disease ([place]) 06/21/2016   COPD (chronic obstructive pulmonary disease) ([place]) 06/21/2016   Cough 06/21/2016   GERD (gastroesophageal reflux disease) 06/21/2016   Hypersomnia 06/21/2016   Tobacco user 06/21/2016   Rectal cancer ([place]) 01/30/2013    Lottie Mussel, Student-PT 04/13/2021, 3:17 PM  [facility] [address] [place], [place], [address] Phone: [phone]   Fax:  [phone]  Name: Erica [name] MRN: [id number] Date of Birth: 18-Apr-1956

## 2021-04-14 NOTE — Telephone Encounter (Signed)
Refill sent for PIRFENIDONE to [facility]: [phone]  Dose: 801 mg three times daily  Called patient to verify if she was interested in taking pirfenidone 874m tablets (to reduce pill burden. Left VM requesting return call)  Last CMP on 03/08/21 showed LFTs wnl  Last OV: 09/03/20 Provider: Dr. RChase Caller Next OV: 04/28/21  DKnox Saliva PharmD, MPH, BCPS Clinical Pharmacist (Rheumatology and Pulmonology)

## 2021-04-14 NOTE — Therapy (Signed)
[facility] [address] [place], [place], [address] Phone: [phone]   Fax:  [phone]  Occupational Therapy Treatment  Patient Details  Name: Erica [name] MRN: [id number] Date of Birth: 1956/02/09 Referring Provider (OT): Dr. Letta Pate   Encounter Date: 04/13/2021   OT End of Session - 04/13/21 1303     Visit Number 12    Number of Visits 25    Date for OT Re-Evaluation 05/19/21    Authorization Type Medicare/ Medicaid    Authorization - Visit Number 12    Progress Note Due on Visit 38    OT Start Time 1235    OT Stop Time 1315    OT Time Calculation (min) 40 min    Activity Tolerance Patient tolerated treatment well    Behavior During Therapy [facility] for tasks assessed/performed             Past Medical History:  Diagnosis Date   Arthritis    especially in shoulders   Asthma    Depression    Diabetes mellitus    Embolic stroke ([place]) 08/08/3333   GERD (gastroesophageal reflux disease)    Headache(784.0)    "mild"   Hypertension    Loop Biotronik loop implant 12/16/2020 12/16/2020   Mental disorder    depression   Neuropathy    feet    Pain    arthritis pain - takes tramadol as needed   Rectal polyp    very little bleeding with bowel movements- no pain   Stroke ([place]) 11/03/2020   Suicide attempt [facility])     Past Surgical History:  Procedure Laterality Date   ANAL RECTAL MANOMETRY N/A 07/13/2016   Procedure: ANO RECTAL MANOMETRY;  Surgeon: Leighton Ruff, MD;  [facility];  Service: Endoscopy;  Laterality: N/A;   CESAREAN SECTION     EUS N/A 11/21/2012   Procedure: LOWER ENDOSCOPIC ULTRASOUND (EUS);  Surgeon: Arta Silence, MD;  [facility];  Service: Endoscopy;  Laterality: N/A;   FLEXIBLE SIGMOIDOSCOPY N/A 11/21/2012   Procedure: FLEXIBLE SIGMOIDOSCOPY;  Surgeon: Arta Silence, MD;  [facility];  Service: Endoscopy;  Laterality: N/A;   FLEXIBLE SIGMOIDOSCOPY N/A  01/28/2013   Procedure: FLEXIBLE SIGMOIDOSCOPY;  Surgeon: Leighton Ruff, MD;  [facility];  Service: Endoscopy;  Laterality: N/A;   LAPAROSCOPIC LOW ANTERIOR RESECTION N/A 01/29/2013   Procedure: LAPAROSCOPIC LOW ANTERIOR RESECTION, Rigid Proctoscopy;  Surgeon: Leighton Ruff, MD;  [facility];  Service: General;  Laterality: N/A;   LAPAROSCOPIC SIGMOID COLECTOMY N/A 11/14/2012   Procedure: DIAGNOSTIC LAPAROSCOPY AND SIGMOIDMOIDOSCOPY ;  Surgeon: Rolm Bookbinder, MD;  [facility];  Service: General;  Laterality: N/A;   RECTAL ULTRASOUND N/A 07/13/2016   Procedure: RECTAL ULTRASOUND;  Surgeon: Leighton Ruff, MD;  [facility];  Service: Endoscopy;  Laterality: N/A;   TONSILLECTOMY     TONSILLECTOMY AND ADENOIDECTOMY      Vitals:   04/13/21 1236  BP: (!) 143/74     Subjective Assessment - 04/13/21 1236     Subjective  Pt reports mild shoulder pain    Pertinent History Referred by [name] 01/21/21 , post-stroke  History of right parieto-occipital infarct. Most recent stroke 11/03/20. PMH: arthritis, asthma, depression, HTN, neuropathy, suicide attempt, DM    Patient Stated Goals do more stuff around the house    Currently in Pain? Yes    Pain Score 2     Pain Location Shoulder    Pain Orientation Left  Pain Descriptors / Indicators Aching    Pain Type Acute pain    Pain Onset 1 to 4 weeks ago    Pain Frequency Constant    Aggravating Factors  overhead reach    Pain Relieving Factors kinesiotape                      Treatment: supine closed chain shoulder flexion followed by seated shoulder flexion and abduction holding vertical pole for gentle AA/ROM for LUE. Low to midrange functional reaching, min v.c for positioning to minimize shoulder pain LUE Pt folded several towels without difficulty. Pt reports she is unable to carry a clothes basket at home. Therapist suggested pt locks her w/c and uses it as a cart to carry laundry Pt  practiced pushing a w/c with clothes basket in it for simulated laundry at home, pt performed safely with supervision. Therapist recommends pt's son provides supervision initially to ensure safety.              OT Short Term Goals - 03/30/21 1127       OT SHORT TERM GOAL #1   Title I with inital  HEP- 03/24/21    Time 4    Period Weeks    Status Achieved    Target Date 03/24/21      OT SHORT TERM GOAL #2   Title I with sensory precautions    Time 4    Period Weeks    Status Achieved   pt  verbalized understanding of precautions 03/23/21     OT SHORT TERM GOAL #3   Title Pt will perform basic home mangement with supervision    Time 4    Period Weeks    Status Achieved   pt reports doing laundry consistently from w/c level in apt 03/23/21     OT SHORT TERM GOAL #4   Title Pt will perform basic cooking with supervision demonstrating good safety awareness.    Time 4    Period Weeks    Status On-going   03/17/21, supervision and min v.c for walker safety     OT SHORT TERM GOAL #5   Title Pt will perform all basic ADLS modified independently.    Time 4    Period Weeks    Status Achieved   goes to beauty shop to get hair shampooed 03/10/21     OT SHORT TERM GOAL #6   Title Pt will retrieve a lightweight object at 115 shoulder flexion with LUE without dropping    Baseline 105*    Time 4    Period Weeks    Status Achieved   min v/c for decreasing abduction 03/23/21              OT Long Term Goals - 04/13/21 1238       OT LONG TERM GOAL #1   Title I with updated HEP. 05/19/21    Time 12    Period Weeks    Status On-going      OT LONG TERM GOAL #2   Title Pt will demonstrate improved fine motor coordination for ADLs as evidenced  by decreasing LUE 9 hole peg test to 28 secs without drops.    Time 12    Period Weeks    Status On-going   28.87, improved but not yet met     OT LONG TERM GOAL #3   Title Pt will demonstrate ability to retrieve a 2 lbs object  from overhead  shelf without drops with LUE.    Time 12    Period Weeks    Status On-going      OT LONG TERM GOAL #4   Title Pt will perform all basic ADLs modified Indpendently.    Time 12    Period Weeks    Status Achieved      OT LONG TERM GOAL #5   Title Pt will perfom basic home management/ cooking mod I with good safety awareness.    Time 12    Period Weeks    Status Achieved      OT LONG TERM GOAL #6   Title I with memory compensation strategies.    Time 12    Period Weeks    Status On-going                   Plan - 04/13/21 1303     Clinical Impression Statement Pt continues to progress towards goals. She remains limited by left shoulder pain, with hx of rotator cuff tear    OT Occupational Profile and History Detailed Assessment- Review of Records and additional review of physical, cognitive, psychosocial history related to current functional performance    Occupational performance deficits (Please refer to evaluation for details): ADL's;IADL's;Leisure    Body Structure / Function / Physical Skills ADL;Endurance;UE functional use;Balance;Flexibility;FMC;Gait;ROM;GMC;Coordination;Decreased knowledge of precautions;Sensation;IADL;Decreased knowledge of use of DME;Dexterity;Mobility    Cognitive Skills Attention;Memory    Rehab Potential Good    Clinical Decision Making Limited treatment options, no task modification necessary    Comorbidities Affecting Occupational Performance: May have comorbidities impacting occupational performance    Modification or Assistance to Complete Evaluation  No modification of tasks or assist necessary to complete eval    OT Frequency 2x / week   plus eval, will likely d/c after 8 weeks dependent on progress   OT Duration 12 weeks    OT Treatment/Interventions Self-care/ADL training;Ultrasound;Patient/family education;DME and/or AE instruction;Gait Training;Passive range of motion;Balance training;Fluidtherapy;Cryotherapy;Electrical  Stimulation;Therapist, nutritional;Therapeutic activities;Manual Therapy;Therapeutic exercise;Cognitive remediation/compensation;Neuromuscular education    Plan simple home management or cooking activities demonstrating good walker safety    Consulted and Agree with Plan of Care Patient             Patient will benefit from skilled therapeutic intervention in order to improve the following deficits and impairments:   Body Structure / Function / Physical Skills: ADL, Endurance, UE functional use, Balance, Flexibility, FMC, Gait, ROM, GMC, Coordination, Decreased knowledge of precautions, Sensation, IADL, Decreased knowledge of use of DME, Dexterity, Mobility Cognitive Skills: Attention, Memory     Visit Diagnosis: Frontal lobe and executive function deficit  Other lack of coordination  Muscle weakness (generalized)  Other disturbances of skin sensation  Attention and concentration deficit  Unsteadiness on feet    Problem List Patient Active Problem List   Diagnosis Date Noted   Encounter for loop recorder check 01/02/2021   Loop Biotronik loop implant 12/16/2020 12/16/2020   Leukopenia    Controlled type 2 diabetes mellitus with hyperglycemia, with long-term current use of insulin (HCC)    Vascular headache    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[place]) 54/62/7035   Embolic stroke ([place]) 00/93/8182   Sinus bradycardia on ECG 11/03/2020   Benign essential HTN    Pancytopenia ([place])    Stage 3b chronic kidney disease ([place])    Anemia    Labile blood glucose    Labile blood pressure    Diabetic peripheral neuropathy (HCC)    Essential hypertension  AKI (acute kidney injury) ([place])    Ataxia, late effect of cerebrovascular disease    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[place]) 06/30/2020   Acute CVA (cerebrovascular accident) ([place]) 06/26/2020   CVA (cerebral vascular accident) ([place]) 06/25/2020   BMI 32.0-32.9,adult 06/03/2020   Left upper lobe pneumonia 03/30/2020   Chest pain 10/01/2019    MDD (major depressive disorder), recurrent severe, without psychosis ([place]) 05/09/2019   Suicide attempt ([place]) 05/09/2019   Cocaine abuse ([place]) 05/09/2019   Interstitial lung disease ([place]) 06/21/2016   COPD (chronic obstructive pulmonary disease) ([place]) 06/21/2016   Cough 06/21/2016   GERD (gastroesophageal reflux disease) 06/21/2016   Hypersomnia 06/21/2016   Tobacco user 06/21/2016   Rectal cancer ([place]) 01/30/2013    Kathren Scearce, OT/L 04/14/2021, 8:08 AM  [facility] [address] [place], [place], [address] Phone: [phone]   Fax:  [phone]  Name: Erica [name] MRN: [id number] Date of Birth: 10-15-1955

## 2021-04-15 ENCOUNTER — Ambulatory Visit: Payer: Medicare HMO | Admitting: Occupational Therapy

## 2021-04-15 ENCOUNTER — Ambulatory Visit: Payer: Medicare HMO | Admitting: Physical Therapy

## 2021-04-20 ENCOUNTER — Encounter: Payer: Self-pay | Admitting: Occupational Therapy

## 2021-04-20 ENCOUNTER — Ambulatory Visit: Payer: Medicare HMO

## 2021-04-20 ENCOUNTER — Ambulatory Visit: Payer: Medicare HMO | Admitting: Occupational Therapy

## 2021-04-20 ENCOUNTER — Encounter: Payer: Medicare HMO | Attending: Registered Nurse | Admitting: Physical Medicine & Rehabilitation

## 2021-04-20 ENCOUNTER — Encounter: Payer: Self-pay | Admitting: Physical Medicine & Rehabilitation

## 2021-04-20 ENCOUNTER — Other Ambulatory Visit: Payer: Self-pay

## 2021-04-20 ENCOUNTER — Ambulatory Visit: Payer: Medicare HMO | Admitting: Physical Therapy

## 2021-04-20 VITALS — BP 138/84 | HR 71 | Temp 98.5°F | Wt 181.0 lb

## 2021-04-20 DIAGNOSIS — R41840 Attention and concentration deficit: Secondary | ICD-10-CM

## 2021-04-20 DIAGNOSIS — I6389 Other cerebral infarction: Secondary | ICD-10-CM | POA: Diagnosis not present

## 2021-04-20 DIAGNOSIS — R269 Unspecified abnormalities of gait and mobility: Secondary | ICD-10-CM | POA: Diagnosis not present

## 2021-04-20 DIAGNOSIS — R278 Other lack of coordination: Secondary | ICD-10-CM

## 2021-04-20 DIAGNOSIS — R2689 Other abnormalities of gait and mobility: Secondary | ICD-10-CM

## 2021-04-20 DIAGNOSIS — R2681 Unsteadiness on feet: Secondary | ICD-10-CM

## 2021-04-20 DIAGNOSIS — M6281 Muscle weakness (generalized): Secondary | ICD-10-CM

## 2021-04-20 DIAGNOSIS — I6381 Other cerebral infarction due to occlusion or stenosis of small artery: Secondary | ICD-10-CM

## 2021-04-20 DIAGNOSIS — R41844 Frontal lobe and executive function deficit: Secondary | ICD-10-CM

## 2021-04-20 DIAGNOSIS — I69398 Other sequelae of cerebral infarction: Secondary | ICD-10-CM | POA: Diagnosis not present

## 2021-04-20 DIAGNOSIS — R208 Other disturbances of skin sensation: Secondary | ICD-10-CM

## 2021-04-20 NOTE — Therapy (Signed)
[facility] [address] [place], [place], [address] Phone: [phone]   Fax:  [phone]  Occupational Therapy Treatment  Patient Details  Name: Erica [name] MRN: [id number] Date of Birth: 1955-10-31 Referring Provider (OT): Dr. Letta Pate   Encounter Date: 04/20/2021   OT End of Session - 04/20/21 1021     Visit Number 13    Number of Visits 25    Date for OT Re-Evaluation 05/19/21    Authorization Type Medicare/ Medicaid    Authorization - Visit Number 13    Progress Note Due on Visit 19    OT Start Time 1019    OT Stop Time 1100    OT Time Calculation (min) 41 min    Activity Tolerance Patient tolerated treatment well    Behavior During Therapy [facility] for tasks assessed/performed             Past Medical History:  Diagnosis Date   Arthritis    especially in shoulders   Asthma    Depression    Diabetes mellitus    Embolic stroke ([place]) 12/07/9483   GERD (gastroesophageal reflux disease)    Headache(784.0)    "mild"   Hypertension    Loop Biotronik loop implant 12/16/2020 12/16/2020   Mental disorder    depression   Neuropathy    feet    Pain    arthritis pain - takes tramadol as needed   Rectal polyp    very little bleeding with bowel movements- no pain   Stroke ([place]) 11/03/2020   Suicide attempt [facility])     Past Surgical History:  Procedure Laterality Date   ANAL RECTAL MANOMETRY N/A 07/13/2016   Procedure: ANO RECTAL MANOMETRY;  Surgeon: Leighton Ruff, MD;  [facility];  Service: Endoscopy;  Laterality: N/A;   CESAREAN SECTION     EUS N/A 11/21/2012   Procedure: LOWER ENDOSCOPIC ULTRASOUND (EUS);  Surgeon: Arta Silence, MD;  Location: [facility];  Service: Endoscopy;  Laterality: N/A;   FLEXIBLE SIGMOIDOSCOPY N/A 11/21/2012   Procedure: FLEXIBLE SIGMOIDOSCOPY;  Surgeon: Arta Silence, MD;  [facility];  Service: Endoscopy;  Laterality: N/A;   FLEXIBLE SIGMOIDOSCOPY N/A  01/28/2013   Procedure: FLEXIBLE SIGMOIDOSCOPY;  Surgeon: Leighton Ruff, MD;  [facility];  Service: Endoscopy;  Laterality: N/A;   LAPAROSCOPIC LOW ANTERIOR RESECTION N/A 01/29/2013   Procedure: LAPAROSCOPIC LOW ANTERIOR RESECTION, Rigid Proctoscopy;  Surgeon: Leighton Ruff, MD;  [facility];  Service: General;  Laterality: N/A;   LAPAROSCOPIC SIGMOID COLECTOMY N/A 11/14/2012   Procedure: DIAGNOSTIC LAPAROSCOPY AND SIGMOIDMOIDOSCOPY ;  Surgeon: Rolm Bookbinder, MD;  [facility];  Service: General;  Laterality: N/A;   RECTAL ULTRASOUND N/A 07/13/2016   Procedure: RECTAL ULTRASOUND;  Surgeon: Leighton Ruff, MD;  [facility];  Service: Endoscopy;  Laterality: N/A;   TONSILLECTOMY     TONSILLECTOMY AND ADENOIDECTOMY      There were no vitals filed for this visit.   Subjective Assessment - 04/20/21 1019     Subjective  "I had to go to my therapist in [place] and my driver forgot about me"    Pertinent History Referred by [name] 01/21/21 , post-stroke  History of right parieto-occipital infarct. Most recent stroke 11/03/20. PMH: arthritis, asthma, depression, HTN, neuropathy, suicide attempt, DM    Patient Stated Goals do more stuff around the house    Currently in Pain? Yes    Pain Score 4     Pain Location Shoulder  Pain Orientation Left    Pain Descriptors / Indicators Aching    Pain Type Chronic pain    Pain Onset 1 to 4 weeks ago    Pain Frequency Constant                Pt repots using the w/c for transporting basket of laundry and it is going well at home.   Small Pegs with LUE and holding dice in palm to encouraging pincer grasp of small pegs. Pt with good coordination and min/mod difficulty copying abstract pattern (blue, green, yellow triangles) and req'd cues for identifying errors.  ADL and navigating kitchen with rolling walker. Pt obtained items needed for making scrambled eggs and simulated making eggs with min v.c for recalling  to turn stove off. Anticipate patient would have remembered if actually making eggs. Pt did well with navigating with RW with no v.c. needed for safety today.   Functional Reach to higher level in cabinet with LUE with tactile and verbal cues for decreasing over activation of trap and elevation of LUE shoulder for reach.                  OT Short Term Goals - 03/30/21 1127       OT SHORT TERM GOAL #1   Title I with inital  HEP- 03/24/21    Time 4    Period Weeks    Status Achieved    Target Date 03/24/21      OT SHORT TERM GOAL #2   Title I with sensory precautions    Time 4    Period Weeks    Status Achieved   pt  verbalized understanding of precautions 03/23/21     OT SHORT TERM GOAL #3   Title Pt will perform basic home mangement with supervision    Time 4    Period Weeks    Status Achieved   pt reports doing laundry consistently from w/c level in apt 03/23/21     OT SHORT TERM GOAL #4   Title Pt will perform basic cooking with supervision demonstrating good safety awareness.    Time 4    Period Weeks    Status On-going   03/17/21, supervision and min v.c for walker safety     OT SHORT TERM GOAL #5   Title Pt will perform all basic ADLS modified independently.    Time 4    Period Weeks    Status Achieved   goes to beauty shop to get hair shampooed 03/10/21     OT SHORT TERM GOAL #6   Title Pt will retrieve a lightweight object at 115 shoulder flexion with LUE without dropping    Baseline 105*    Time 4    Period Weeks    Status Achieved   min v/c for decreasing abduction 03/23/21              OT Long Term Goals - 04/13/21 1238       OT LONG TERM GOAL #1   Title I with updated HEP. 05/19/21    Time 12    Period Weeks    Status On-going      OT LONG TERM GOAL #2   Title Pt will demonstrate improved fine motor coordination for ADLs as evidenced  by decreasing LUE 9 hole peg test to 28 secs without drops.    Time 12    Period Weeks    Status  On-going   28.87, improved but  not yet met     OT LONG TERM GOAL #3   Title Pt will demonstrate ability to retrieve a 2 lbs object from overhead shelf without drops with LUE.    Time 12    Period Weeks    Status On-going      OT LONG TERM GOAL #4   Title Pt will perform all basic ADLs modified Indpendently.    Time 12    Period Weeks    Status Achieved      OT LONG TERM GOAL #5   Title Pt will perfom basic home management/ cooking mod I with good safety awareness.    Time 12    Period Weeks    Status Achieved      OT LONG TERM GOAL #6   Title I with memory compensation strategies.    Time 12    Period Weeks    Status On-going                   Plan - 04/20/21 1101     Clinical Impression Statement Pt continues with limitations to LUE shoulder with history of RCT. Pt with good RW safety with kitchen mobility.    OT Occupational Profile and History Detailed Assessment- Review of Records and additional review of physical, cognitive, psychosocial history related to current functional performance    Occupational performance deficits (Please refer to evaluation for details): ADL's;IADL's;Leisure    Body Structure / Function / Physical Skills ADL;Endurance;UE functional use;Balance;Flexibility;FMC;Gait;ROM;GMC;Coordination;Decreased knowledge of precautions;Sensation;IADL;Decreased knowledge of use of DME;Dexterity;Mobility    Cognitive Skills Attention;Memory    Rehab Potential Good    Clinical Decision Making Limited treatment options, no task modification necessary    Comorbidities Affecting Occupational Performance: May have comorbidities impacting occupational performance    Modification or Assistance to Complete Evaluation  No modification of tasks or assist necessary to complete eval    OT Frequency 2x / week   plus eval, will likely d/c after 8 weeks dependent on progress   OT Duration 12 weeks    OT Treatment/Interventions Self-care/ADL  training;Ultrasound;Patient/family education;DME and/or AE instruction;Gait Training;Passive range of motion;Balance training;Fluidtherapy;Cryotherapy;Electrical Stimulation;Therapist, nutritional;Therapeutic activities;Manual Therapy;Therapeutic exercise;Cognitive remediation/compensation;Neuromuscular education    Plan simple home management or cooking activities demonstrating good walker safety    Consulted and Agree with Plan of Care Patient             Patient will benefit from skilled therapeutic intervention in order to improve the following deficits and impairments:   Body Structure / Function / Physical Skills: ADL, Endurance, UE functional use, Balance, Flexibility, FMC, Gait, ROM, GMC, Coordination, Decreased knowledge of precautions, Sensation, IADL, Decreased knowledge of use of DME, Dexterity, Mobility Cognitive Skills: Attention, Memory     Visit Diagnosis: Other abnormalities of gait and mobility  Unsteadiness on feet  Muscle weakness (generalized)  Frontal lobe and executive function deficit  Other lack of coordination  Other disturbances of skin sensation  Attention and concentration deficit    Problem List Patient Active Problem List   Diagnosis Date Noted   Encounter for loop recorder check 01/02/2021   Loop Biotronik loop implant 12/16/2020 12/16/2020   Leukopenia    Controlled type 2 diabetes mellitus with hyperglycemia, with long-term current use of insulin (HCC)    Vascular headache    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[place]) 24/23/5361   Embolic stroke ([place]) 44/31/5400   Sinus bradycardia on ECG 11/03/2020   Benign essential HTN    Pancytopenia ([place])    Stage  3b chronic kidney disease ([place])    Anemia    Labile blood glucose    Labile blood pressure    Diabetic peripheral neuropathy ([place])    Essential hypertension    AKI (acute kidney injury) ([place])    Ataxia, late effect of cerebrovascular disease    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[place]) 06/30/2020    Acute CVA (cerebrovascular accident) ([place]) 06/26/2020   CVA (cerebral vascular accident) ([place]) 06/25/2020   BMI 32.0-32.9,adult 06/03/2020   Left upper lobe pneumonia 03/30/2020   Chest pain 10/01/2019   MDD (major depressive disorder), recurrent severe, without psychosis ([place]) 05/09/2019   Suicide attempt ([place]) 05/09/2019   Cocaine abuse ([place]) 05/09/2019   Interstitial lung disease ([place]) 06/21/2016   COPD (chronic obstructive pulmonary disease) ([place]) 06/21/2016   Cough 06/21/2016   GERD (gastroesophageal reflux disease) 06/21/2016   Hypersomnia 06/21/2016   Tobacco user 06/21/2016   Rectal cancer ([place]) 01/30/2013    Zachery Conch, OT/L 04/20/2021, 11:01 AM  [facility] [address] [place], [place], [address] Phone: [phone]   Fax:  [phone]  Name: CHARNEL GILES MRN: [id number] Date of Birth: June 23, 1955

## 2021-04-20 NOTE — Patient Instructions (Signed)
See you in 49mo keep up with exercises after PT is done!

## 2021-04-20 NOTE — Progress Notes (Signed)
Subjective:    Patient ID: Erica [name], female    DOB: August 21, 1955, 65 y.o.   MRN: [id number] 65 y.o. 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  Per chart review lives with her children.  Independent with assistive device.  1 level home with level entry.  Good supportive family.  Presented 11/03/2020 with persistent headache and left-sided weakness x3 days.  MRI showed punctate acute infarction at the medial right parieto-occipital junction.  Chronic small vessel ischemic changes.  MRA of the head negative for large vessel occlusion.  MRI cervical spine with persistent patchy signal abnormality involving the central dorsal aspect of the cervical spinal cord extending from C2-C7-T1 relatively stable and unchanged from previous tracings.  Admission chemistries unremarkable except chloride 113 creatinine 1.70 hemoglobin 9.2 hemoglobin A1c 6.3.  Echocardiogram with ejection fraction of 60 to 15% grade 1 diastolic dysfunction.  Maintained on aspirin and Plavix for CVA prophylaxis x3 weeks and aspirin alone.      HPI Left shoulder pain, did not get injection due to elevated glucose, unable to do surgery yet, using voltaren gel and heat to Left shoulder, plans to follow-up with [facility] at [facility]. Doing some simple meal prep Doing laundry transporting basket in [facility] [place] step 4 steps  No recent falls. Pain Inventory Average Pain 6 Pain Right Now 6 My pain is intermittent and tingling  In the last 24 hours, has pain interfered with the following? General activity 7 Relation with others 7 Enjoyment of life 7 What TIME of day is your pain at its worst? night Sleep (in general) Fair  Pain is worse with: walking and some activites Pain improves with: therapy/exercise Relief  from Meds: 7  Family History  Problem Relation Age of Onset   Hypertension Father    Stroke Father    Diabetes Father    Heart disease Mother    Hypertension Mother    Hyperlipidemia Mother    Hypertension Sister    Hypertension Brother    Hypertension Sister    Hypertension Brother    Cancer Other 52       colon cancer    Cancer Maternal Aunt        cancer, unknown type    Cancer Maternal Uncle        bone cancer    Cancer Paternal Aunt        lung cancer   Cancer Paternal Uncle        lung cancer   Cancer Maternal Uncle        colon cancer and prostate cancer    Cancer Maternal Uncle        prostate cancer    Social History   Socioeconomic History   Marital status: Single    Spouse name: Not on file   Number of children: 1   Years of education: Not on file   Highest education level: Not on file  Occupational History   Not on file  Tobacco Use   Smoking status: Former    Packs/day: 0.50    Years: 20.00    Pack years: 10.00    Types: Cigarettes    Quit date: 01/05/2018    Years since quitting: 3.2   Smokeless tobacco: Never   Tobacco comments:    Unknown   Vaping Use  Vaping Use: Never used  Substance and Sexual Activity   Alcohol use: Not Currently   Drug use: Not Currently    Types: "Crack" cocaine    Comment: Unknown    Sexual activity: Never    Birth control/protection: None  Other Topics Concern   Not on file  Social History Narrative   12/30/20 lives with others   Social Determinants of Health   Financial Resource Strain: Not on file  Food Insecurity: Not on file  Transportation Needs: Not on file  Physical Activity: Not on file  Stress: Not on file  Social Connections: Not on file   Past Surgical History:  Procedure Laterality Date   ANAL RECTAL MANOMETRY N/A 07/13/2016   Procedure: ANO RECTAL MANOMETRY;  Surgeon: Leighton Ruff, MD;  [facility];  Service: Endoscopy;  Laterality: N/A;   CESAREAN SECTION     EUS N/A  11/21/2012   Procedure: LOWER ENDOSCOPIC ULTRASOUND (EUS);  Surgeon: Arta Silence, MD;  Location: [facility];  Service: Endoscopy;  Laterality: N/A;   FLEXIBLE SIGMOIDOSCOPY N/A 11/21/2012   Procedure: FLEXIBLE SIGMOIDOSCOPY;  Surgeon: Arta Silence, MD;  [facility];  Service: Endoscopy;  Laterality: N/A;   FLEXIBLE SIGMOIDOSCOPY N/A 01/28/2013   Procedure: FLEXIBLE SIGMOIDOSCOPY;  Surgeon: Leighton Ruff, MD;  [facility];  Service: Endoscopy;  Laterality: N/A;   LAPAROSCOPIC LOW ANTERIOR RESECTION N/A 01/29/2013   Procedure: LAPAROSCOPIC LOW ANTERIOR RESECTION, Rigid Proctoscopy;  Surgeon: Leighton Ruff, MD;  [facility];  Service: General;  Laterality: N/A;   LAPAROSCOPIC SIGMOID COLECTOMY N/A 11/14/2012   Procedure: DIAGNOSTIC LAPAROSCOPY AND SIGMOIDMOIDOSCOPY ;  Surgeon: Rolm Bookbinder, MD;  [facility];  Service: General;  Laterality: N/A;   RECTAL ULTRASOUND N/A 07/13/2016   Procedure: RECTAL ULTRASOUND;  Surgeon: Leighton Ruff, MD;  [facility];  Service: Endoscopy;  Laterality: N/A;   TONSILLECTOMY     TONSILLECTOMY AND ADENOIDECTOMY     Past Surgical History:  Procedure Laterality Date   ANAL RECTAL MANOMETRY N/A 07/13/2016   Procedure: ANO RECTAL MANOMETRY;  Surgeon: Leighton Ruff, MD;  [facility];  Service: Endoscopy;  Laterality: N/A;   CESAREAN SECTION     EUS N/A 11/21/2012   Procedure: LOWER ENDOSCOPIC ULTRASOUND (EUS);  Surgeon: Arta Silence, MD;  [facility];  Service: Endoscopy;  Laterality: N/A;   FLEXIBLE SIGMOIDOSCOPY N/A 11/21/2012   Procedure: FLEXIBLE SIGMOIDOSCOPY;  Surgeon: Arta Silence, MD;  [facility];  Service: Endoscopy;  Laterality: N/A;   FLEXIBLE SIGMOIDOSCOPY N/A 01/28/2013   Procedure: FLEXIBLE SIGMOIDOSCOPY;  Surgeon: Leighton Ruff, MD;  [facility];  Service: Endoscopy;  Laterality: N/A;   LAPAROSCOPIC LOW ANTERIOR RESECTION N/A 01/29/2013   Procedure: LAPAROSCOPIC  LOW ANTERIOR RESECTION, Rigid Proctoscopy;  Surgeon: Leighton Ruff, MD;  [facility];  Service: General;  Laterality: N/A;   LAPAROSCOPIC SIGMOID COLECTOMY N/A 11/14/2012   Procedure: DIAGNOSTIC LAPAROSCOPY AND SIGMOIDMOIDOSCOPY ;  Surgeon: Rolm Bookbinder, MD;  [facility];  Service: General;  Laterality: N/A;   RECTAL ULTRASOUND N/A 07/13/2016   Procedure: RECTAL ULTRASOUND;  Surgeon: Leighton Ruff, MD;  [facility];  Service: Endoscopy;  Laterality: N/A;   TONSILLECTOMY     TONSILLECTOMY AND ADENOIDECTOMY     Past Medical History:  Diagnosis Date   Arthritis    especially in shoulders   Asthma    Depression    Diabetes mellitus    Embolic stroke ([place]) 1/61/0960   GERD (gastroesophageal reflux disease)    Headache(784.0)    "  mild"   Hypertension    Loop Biotronik loop implant 12/16/2020 12/16/2020   Mental disorder    depression   Neuropathy    feet    Pain    arthritis pain - takes tramadol as needed   Rectal polyp    very little bleeding with bowel movements- no pain   Stroke ([place]) 11/03/2020   Suicide attempt ([place])    Pulse 71   Temp 98.5 F (36.9 C)   Wt 181 lb (82.1 kg)   SpO2 95%   BMI 32.06 kg/m   Opioid Risk Score:   Fall Risk Score:  `1  Depression screen PHQ 2/9  Depression screen [facility] 2/9 04/20/2021 12/11/2020 12/03/2020 07/29/2020  Decreased Interest 1 2 0 3  Down, Depressed, Hopeless _0 PHQ - 2 Score _1 Altered sleeping - - 1 3  Tired, decreased energy - - 3 3  Change in appetite - - 2 3  Feeling bad or failure about yourself  - - 0 3  Trouble concentrating - - 3 3  Moving slowly or fidgety/restless - - 1 3  Suicidal thoughts - - 0 1  PHQ-9 Score - - 11 25  Difficult doing work/chores - - Very difficult -  Some recent data might be hidden    Review of Systems  Musculoskeletal:  Positive for gait problem.       Left shoulder, left knee, left foot  All other systems reviewed and are negative.     Objective:    Physical Exam Vitals and nursing note reviewed.  Constitutional:      Appearance: She is obese.  HENT:     Head: Normocephalic and atraumatic.  Eyes:     Extraocular Movements: Extraocular movements intact.     Conjunctiva/sclera: Conjunctivae normal.     Pupils: Pupils are equal, round, and reactive to light.  Musculoskeletal:     Comments: Positive impingement sign left shoulder. No evidence of shoulder subluxation.  Positive drop arm test  Skin:    General: Skin is warm and dry.  Neurological:     Mental Status: She is alert and oriented to person, place, and time.     Comments: Motor strength is 5/5 in the right deltoid, bicep, tricep, grip, hip flexor, knee extensor, ankle dorsiflexor and plantar flexor Left upper extremities 3 - shoulder 4 at the bicep tricep grip hip flexor knee extensor ankle dorsiflexor. Sensation mildly reduced left upper and left lower limb with light touch.  Psychiatric:        Mood and Affect: Mood normal.        Behavior: Behavior normal.          Assessment & Plan:  1.  History of right thalamic infarct in January 2022 followed by a right parieto-occipital infarct in May 2022.  She is still making some functional improvements.  Her left shoulder weakness is mainly due to her pain which is likely a rotator cuff issue.  She will finish up with outpatient PT OT Follow-up with orthopedics Dr. Maxie Better See me back in around 6 months.

## 2021-04-20 NOTE — Therapy (Signed)
[facility] [address] [place], [place], [address] Phone: [phone]   Fax:  [phone]  Physical Therapy Treatment/Recert  Patient Details  Name: Erica [name] MRN: [id number] Date of Birth: 1956/02/08 Referring Provider (PT): [name]   Encounter Date: 04/20/2021   PT End of Session - 04/20/21 0934     Visit Number 13    Number of Visits 29    Date for PT Re-Evaluation 06/11/21    Authorization Type [employer]    Authorization Time Period 17 visits 9/16-11/4/22    Progress Note Due on Visit 10    PT Start Time 0933    PT Stop Time 1017    PT Time Calculation (min) 44 min    Equipment Utilized During Treatment Gait belt    Activity Tolerance Patient tolerated treatment well    Behavior During Therapy [facility] for tasks assessed/performed             Past Medical History:  Diagnosis Date   Arthritis    especially in shoulders   Asthma    Depression    Diabetes mellitus    Embolic stroke ([place]) 02/08/9370   GERD (gastroesophageal reflux disease)    Headache(784.0)    "mild"   Hypertension    Loop Biotronik loop implant 12/16/2020 12/16/2020   Mental disorder    depression   Neuropathy    feet    Pain    arthritis pain - takes tramadol as needed   Rectal polyp    very little bleeding with bowel movements- no pain   Stroke ([place]) 11/03/2020   Suicide attempt [facility])     Past Surgical History:  Procedure Laterality Date   ANAL RECTAL MANOMETRY N/A 07/13/2016   Procedure: ANO RECTAL MANOMETRY;  Surgeon: Leighton Ruff, MD;  Location: [facility] ENDOSCOPY;  Service: Endoscopy;  Laterality: N/A;   CESAREAN SECTION     EUS N/A 11/21/2012   Procedure: LOWER ENDOSCOPIC ULTRASOUND (EUS);  Surgeon: Arta Silence, MD;  Location: [facility];  Service: Endoscopy;  Laterality: N/A;   FLEXIBLE SIGMOIDOSCOPY N/A 11/21/2012   Procedure: FLEXIBLE SIGMOIDOSCOPY;  Surgeon: Arta Silence, MD;  Location: [facility]OS[facility];   Service: Endoscopy;  Laterality: N/A;   FLEXIBLE SIGMOIDOSCOPY N/A 01/28/2013   Procedure: FLEXIBLE SIGMOIDOSCOPY;  Surgeon: Leighton Ruff, MD;  Location: [facility]OS[facility];  Service: Endoscopy;  Laterality: N/A;   LAPAROSCOPIC LOW ANTERIOR RESECTION N/A 01/29/2013   Procedure: LAPAROSCOPIC LOW ANTERIOR RESECTION, Rigid Proctoscopy;  Surgeon: Leighton Ruff, MD;  Location: [facility]S;  Service: General;  Laterality: N/A;   LAPAROSCOPIC SIGMOID COLECTOMY N/A 11/14/2012   Procedure: DIAGNOSTIC LAPAROSCOPY AND SIGMOIDMOIDOSCOPY ;  Surgeon: Rolm Bookbinder, MD;  Location: [facility];  Service: General;  Laterality: N/A;   RECTAL ULTRASOUND N/A 07/13/2016   Procedure: RECTAL ULTRASOUND;  Surgeon: Leighton Ruff, MD;  Location: [facility] ENDOS[facility];  Service: Endoscopy;  Laterality: N/A;   TONSILLECTOMY     TONSILLECTOMY AND ADENOIDECTOMY      There were no vitals filed for this visit.   Subjective Assessment - 04/20/21 0934     Subjective Pt reports that she is doing okay. Pt reports she did pretty good with stairs at her neice's party with rail. She is using the cane at home. Does still tire with longer distances. Sees Dr. Letta Pate today.    Pertinent History arthritis, asthma, depression, HTN, neuropathy, suicide attempt, DM, CVA    Patient Stated Goals Pt would like to be able to walk and use her hands  better.    Currently in Pain? Yes    Pain Score 3     Pain Location Shoulder    Pain Orientation Left    Pain Descriptors / Indicators Aching    Pain Type Chronic pain    Pain Onset 1 to 4 weeks ago    Pain Frequency Intermittent    Aggravating Factors  overhead reach                [facility] PT Assessment - 04/20/21 0935       Assessment   Medical Diagnosis CVA    Referring Provider (PT) [name]    Onset Date/Surgical Date 01/21/21      Observation/Other Assessments   Focus on Therapeutic Outcomes (FOTO)  62      6 Minute Walk- Baseline   6 Minute Walk- Baseline yes    BP (mmHg) 178/88       6 Minute walk- Post Test   6 Minute Walk Post Test yes    BP (mmHg) 188/92    Modified Borg Scale for Dyspnea 8-      6 minute walk test results    Aerobic Endurance Distance Walked 690    Endurance additional comments brief seated rest break at 2 min 45 sec. Increased antalgic pattern. Used cane with quad tip                           [facility] Adult PT Treatment/Exercise - 04/20/21 0935       Ambulation/Gait   Ambulation/Gait Yes    Ambulation/Gait Assistance 5: Supervision    Ambulation Distance (Feet) 690 Feet    Assistive device Straight cane   with quad tip   Gait Pattern Step-through pattern;Decreased stance time - left;Decreased hip/knee flexion - left;Decreased dorsiflexion - left;Decreased arm swing - left    Ambulation Surface Level;Indoor    Stairs Yes    Stairs Assistance 5: Supervision    Stair Management Technique One rail Right;Alternating pattern    Number of Stairs 4    Height of Stairs 6      Standardized Balance Assessment   Standardized Balance Assessment Berg Balance Test;Dynamic Gait Index      Berg Balance Test   Sit to Stand Able to stand without using hands and stabilize independently    Standing Unsupported Able to stand safely 2 minutes    Sitting with Back Unsupported but Feet Supported on Floor or Stool Able to sit safely and securely 2 minutes    Stand to Sit Sits safely with minimal use of hands    Transfers Able to transfer safely, minor use of hands    Standing Unsupported with Eyes Closed Able to stand 10 seconds safely    Standing Ubsupported with Feet Together Able to place feet together independently and stand 1 minute safely    From Standing, Reach Forward with Outstretched Arm Can reach confidently >25 cm (10")    From Standing Position, Pick up Object from Floor Able to pick up shoe safely and easily    From Standing Position, Turn to Look Behind Over each Shoulder Looks behind one side only/other side shows less weight  shift    Turn 360 Degrees Able to turn 360 degrees safely but slowly    Standing Unsupported, Alternately Place Feet on Step/Stool Able to stand independently and safely and complete 8 steps in 20 seconds    Standing Unsupported, One Foot in Front Able to plae  foot ahead of the other independently and hold 30 seconds    Standing on One Leg Tries to lift leg/unable to hold 3 seconds but remains standing independently    Total Score 49      Dynamic Gait Index   Level Surface Mild Impairment    Change in Gait Speed Mild Impairment    Gait with Horizontal Head Turns Moderate Impairment    Gait with Vertical Head Turns Mild Impairment    Gait and Pivot Turn Mild Impairment    Step Over Obstacle Moderate Impairment    Step Around Obstacles Mild Impairment    Steps Mild Impairment    Total Score 14                     PT Education - 04/20/21 1038     Education Details Recert plan. To discuss BP with MD at visit today.    Person(s) Educated Patient    Methods Explanation    Comprehension Verbalized understanding              PT Short Term Goals - 03/17/21 1410       PT SHORT TERM GOAL #1   Title Pt will be independent with initial strengthening and balance HEP.    Baseline Pt reports exercises going pretty good.    Time 4    Period Weeks    Status Achieved    Target Date 03/17/21      PT SHORT TERM GOAL #2   Title Pt will decrease 5 x sit to stand from 28.59 sec to <20 sec for improved balance and functional mobility.    Baseline 02/16/21 28.59 sec. 03/17/21 16.63 sec    Time 4    Period Weeks    Status Achieved    Target Date 03/17/21      PT SHORT TERM GOAL #3   Title Pt will increase Berg from 38 to 42/56 for improved balance and decreased falls risk.    Baseline 02/16/21 38/56. 03/17/21 44/56    Time 4    Period Weeks    Status Achieved    Target Date 03/17/21      PT SHORT TERM GOAL #4   Title Pt will ambulate >500' with RW on level surfaces mod I for  improved household and short community distances.    Baseline 03/17/21 230' with RW supervision    Time 4    Period Weeks    Status Not Met    Target Date 03/17/21               PT Long Term Goals - 04/20/21 0949       PT LONG TERM GOAL #1   Title Pt will be independent with progressive HEP for strengthening and balance to continue gains on own.    Baseline 04/20/21 Pt has been instructed in HEP she is working on . PT continues to add and update.    Time 8    Period Weeks    Status On-going      PT LONG TERM GOAL #2   Title Pt will increase gait speed to >0.57ms for improved gait safety.    Baseline 02/16/21 0.360m, .67 m/s on 10/21 with RW,    Time 8    Period Weeks    Status Achieved      PT LONG TERM GOAL #3   Title Pt will increase Berg Balance to >45/56 for improved balance and decreased fall risk.  Baseline 02/16/21 38/56. 04/20/21 49/56    Time 8    Period Weeks    Status Achieved      PT LONG TERM GOAL #4   Title Pt will ambulate >800' on varied surfaces with LRAD mod I for improved short community distances.    Baseline 11/1- pt ambulated 550' on varied surface w/ quad tip cane    Time 8    Period Weeks    Status Partially Met      PT LONG TERM GOAL #5   Title Pt will ambulate up/down 4 steps with 1 rail mod I to safely enter/exit home.    Baseline 04/20/21 supervision with 1 rail in reciprocal pattern    Time 8    Period Weeks    Status Partially Met      PT LONG TERM GOAL #6   Title Pt will increase FOTO from 50 to 60.    Baseline 02/16/21 50. 04/20/21 62    Time 8    Period Weeks    Status Achieved            Updated goals:  PT Short Term Goals - 04/20/21 1049       PT SHORT TERM GOAL #1   Title Pt will increase Berg from 49/56 to >53/56 for improved balance and decreased fall risk.    Baseline 04/20/21 49/56    Time 4    Period Weeks    Status New    Target Date 05/18/21      PT SHORT TERM GOAL #2   Title Pt will ambulate up/down curb  and ramp with cane mod I for improved community access.    Time 4    Period Weeks    Status New    Target Date 05/18/21      PT SHORT TERM GOAL #3   Title Pt will increase gait speed to >0.54ms with cane for improved community mobility.    Baseline 0.67m    Time 4    Period Weeks    Status New    Target Date 05/18/21             PT Long Term Goals - 04/20/21 1052       PT LONG TERM GOAL #1   Title Pt will be independent with progressive HEP for strengthening and balance to continue gains on own. (LTGs due 06/11/21)    Baseline 04/20/21 Pt has been instructed in HEP she is working on . PT continues to add and update.    Time 8    Period Weeks    Status On-going    Target Date 06/11/21      PT LONG TERM GOAL #2   Title Pt will increase DGI from 14/24 to >19/24 for improved balance and gait safety.    Baseline 04/20/21 14/24    Time 8    Period Weeks    Status New    Target Date 06/11/21      PT LONG TERM GOAL #3   Title Pt will ambulate up/down 4 steps with cane in reciprocal pattern for improved community access and functional strength.    Time 8    Period Weeks    Status New    Target Date 06/11/21      PT LONG TERM GOAL #4   Title Pt will ambulate >800' on varied surfaces with cane mod I for improved short community distances.    Baseline 11/1- pt ambulated 550' on varied surface  w/ quad tip cane supervision.    Time 8    Period Weeks    Status Revised    Target Date 06/11/21      PT LONG TERM GOAL #5   Title Pt will increase  6 min walk distance to >800' for improved activity tolerance and function.    Baseline 04/20/21 690'    Time 8    Period Weeks    Status New    Target Date 06/11/21      PT LONG TERM GOAL #6   Title FOTO will be completed at d/c to get final measure on self perceived disability.    Baseline 02/16/21 50. 04/20/21 62    Time 8    Period Weeks    Status Revised    Target Date 06/11/21                   Plan - 04/20/21  1041     Clinical Impression Statement PT assessed remaining LTGs today. Pt has met 4/6 LTGs with progress toward the remaining 2. She increased Berg score to 49/56 indicating decreased fall risk. PT performed DGI today for first time with pt scoring 14/24 indicating fall risk with more dynamic gait activities. Pt's gait speed has improved over course of therapy and now working more more gait with cane. Speed is at 0.52ms indicating safety with household mobility but decreased safety for community ambulator. Pt's FOTO score increased to 62 which was higher than the predicted score for discharge in less visits. Pt still fatigues with longer distances with increased weakness noted in LLE as goes on. Performed 6 min walk today with pt covering 690'. She did have to rest once during performance. Pt's BP was higher today. Will be seeing physiatrist today and let them follow up on this. Pt continues to benefit from skilled PT to further progress her gait, strength, balance and functional mobility to maximize her function.    Personal Factors and Comorbidities Comorbidity 3+    Comorbidities arthritis, asthma, depression, HTN, neuropathy, suicide attempt, DM    Examination-Activity Limitations Locomotion Level;Transfers;Stand;Stairs;Squat    Examination-Participation Restrictions Community Activity;Driving;Yard Work;Cleaning    Stability/Clinical Decision Making Evolving/Moderate complexity    Clinical Decision Making Moderate    Rehab Potential Good    PT Frequency 2x / week    PT Duration 8 weeks    PT Treatment/Interventions ADLs/Self Care Home Management;DME Instruction;Neuromuscular re-education;Manual techniques;Balance training;Therapeutic exercise;Therapeutic activities;Functional mobility training;Stair training;Gait training;Vestibular;Passive range of motion;Orthotic Fit/Training;Patient/family education    PT Next Visit Plan continue LLE strengthening and balance training. Gait training with cane  with quad tip. Step-ups with LLE working on control. Did pt get foot up brace?    Consulted and Agree with Plan of Care Patient             Patient will benefit from skilled therapeutic intervention in order to improve the following deficits and impairments:  Abnormal gait, Decreased balance, Decreased activity tolerance, Decreased mobility, Decreased strength, Decreased knowledge of use of DME, Decreased endurance, Impaired sensation  Visit Diagnosis: Other abnormalities of gait and mobility  Muscle weakness (generalized)  Unsteadiness on feet     Problem List Patient Active Problem List   Diagnosis Date Noted   Encounter for loop recorder check 01/02/2021   Loop Biotronik loop implant 12/16/2020 12/16/2020   Leukopenia    Controlled type 2 diabetes mellitus with hyperglycemia, with long-term current use of insulin (HCC)    Vascular headache    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   Embolic stroke ([place]) 40/01/6760   Sinus bradycardia on ECG 11/03/2020   Benign essential HTN    Pancytopenia (HCC)    Stage 3b chronic kidney disease ([place])    Anemia    Labile blood glucose    Labile blood pressure    Diabetic peripheral neuropathy ([place])    Essential hypertension    AKI (acute kidney injury) ([place])    Ataxia, late effect of cerebrovascular disease    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[place]) 06/30/2020   Acute CVA (cerebrovascular accident) ([place]) 06/26/2020   CVA (cerebral vascular accident) ([place]) 06/25/2020   BMI 32.0-32.9,adult 06/03/2020   Left upper lobe pneumonia 03/30/2020   Chest pain 10/01/2019   MDD (major depressive disorder), recurrent severe, without psychosis ([place]) 05/09/2019   Suicide attempt ([place]) 05/09/2019   Cocaine abuse ([place]) 05/09/2019   Interstitial lung disease ([place]) 06/21/2016   COPD (chronic obstructive pulmonary disease) ([place]) 06/21/2016   Cough 06/21/2016   GERD (gastroesophageal reflux disease) 06/21/2016   Hypersomnia 06/21/2016   Tobacco user  06/21/2016   Rectal cancer ([place]) 01/30/2013    Electa Sniff, PT, DPT, NCS 04/20/2021, 10:48 AM  [facility] [address] [place], [place], [address] Phone: [phone]   Fax:  [phone]  Name: LASHENA SIGNER MRN: [id number] Date of Birth: Jul 06, 1955

## 2021-04-22 ENCOUNTER — Ambulatory Visit: Payer: Medicare HMO | Admitting: Occupational Therapy

## 2021-04-22 ENCOUNTER — Ambulatory Visit: Payer: Medicare HMO | Admitting: Physical Therapy

## 2021-04-23 ENCOUNTER — Ambulatory Visit: Payer: Medicare HMO | Admitting: Physical Therapy

## 2021-04-27 ENCOUNTER — Ambulatory Visit: Payer: Medicare HMO | Admitting: Physical Therapy

## 2021-04-27 ENCOUNTER — Encounter: Payer: Self-pay | Admitting: Physical Therapy

## 2021-04-27 ENCOUNTER — Ambulatory Visit: Payer: Medicare HMO | Admitting: Occupational Therapy

## 2021-04-27 ENCOUNTER — Other Ambulatory Visit: Payer: Self-pay

## 2021-04-27 DIAGNOSIS — M6281 Muscle weakness (generalized): Secondary | ICD-10-CM

## 2021-04-27 DIAGNOSIS — I69398 Other sequelae of cerebral infarction: Secondary | ICD-10-CM | POA: Diagnosis not present

## 2021-04-27 DIAGNOSIS — R2681 Unsteadiness on feet: Secondary | ICD-10-CM

## 2021-04-27 DIAGNOSIS — R41840 Attention and concentration deficit: Secondary | ICD-10-CM

## 2021-04-27 DIAGNOSIS — R2689 Other abnormalities of gait and mobility: Secondary | ICD-10-CM

## 2021-04-27 DIAGNOSIS — R278 Other lack of coordination: Secondary | ICD-10-CM

## 2021-04-27 NOTE — Therapy (Signed)
[facility] [address] [place], [place], [address] Phone: [phone]   Fax:  [phone]  Physical Therapy Treatment  Patient Details  Name: Erica [name] MRN: [id number] Date of Birth: 09/26/1955 Referring Provider (PT): [name]   Encounter Date: 04/27/2021   PT End of Session - 04/27/21 0938     Visit Number 14    Number of Visits 29    Date for PT Re-Evaluation 06/11/21    Authorization Type [employer]    Authorization Time Period 17 visits 9/16-11/4/22; 16 visists from 11/8-12/30/22    Authorization - Visit Number 2    Authorization - Number of Visits 16    Progress Note Due on Visit 90    PT Start Time 0933    PT Stop Time 1015    PT Time Calculation (min) 42 min    Equipment Utilized During Treatment Gait belt    Activity Tolerance Patient tolerated treatment well    Behavior During Therapy [facility] for tasks assessed/performed             Past Medical History:  Diagnosis Date   Arthritis    especially in shoulders   Asthma    Depression    Diabetes mellitus    Embolic stroke ([place]) 3/64/6803   GERD (gastroesophageal reflux disease)    Headache(784.0)    "mild"   Hypertension    Loop Biotronik loop implant 12/16/2020 12/16/2020   Mental disorder    depression   Neuropathy    feet    Pain    arthritis pain - takes tramadol as needed   Rectal polyp    very little bleeding with bowel movements- no pain   Stroke ([place]) 11/03/2020   Suicide attempt [facility])     Past Surgical History:  Procedure Laterality Date   ANAL RECTAL MANOMETRY N/A 07/13/2016   Procedure: ANO RECTAL MANOMETRY;  Surgeon: Leighton Ruff, MD;  [facility];  Service: Endoscopy;  Laterality: N/A;   CESAREAN SECTION     EUS N/A 11/21/2012   Procedure: LOWER ENDOSCOPIC ULTRASOUND (EUS);  Surgeon: Arta Silence, MD;  [facility];  Service: Endoscopy;  Laterality: N/A;   FLEXIBLE SIGMOIDOSCOPY N/A  11/21/2012   Procedure: FLEXIBLE SIGMOIDOSCOPY;  Surgeon: Arta Silence, MD;  [facility];  Service: Endoscopy;  Laterality: N/A;   FLEXIBLE SIGMOIDOSCOPY N/A 01/28/2013   Procedure: FLEXIBLE SIGMOIDOSCOPY;  Surgeon: Leighton Ruff, MD;  [facility];  Service: Endoscopy;  Laterality: N/A;   LAPAROSCOPIC LOW ANTERIOR RESECTION N/A 01/29/2013   Procedure: LAPAROSCOPIC LOW ANTERIOR RESECTION, Rigid Proctoscopy;  Surgeon: Leighton Ruff, MD;  [facility];  Service: General;  Laterality: N/A;   LAPAROSCOPIC SIGMOID COLECTOMY N/A 11/14/2012   Procedure: DIAGNOSTIC LAPAROSCOPY AND SIGMOIDMOIDOSCOPY ;  Surgeon: Rolm Bookbinder, MD;  [facility];  Service: General;  Laterality: N/A;   RECTAL ULTRASOUND N/A 07/13/2016   Procedure: RECTAL ULTRASOUND;  Surgeon: Leighton Ruff, MD;  [facility];  Service: Endoscopy;  Laterality: N/A;   TONSILLECTOMY     TONSILLECTOMY AND ADENOIDECTOMY      There were no vitals filed for this visit.   Subjective Assessment - 04/27/21 0936     Subjective No new complaitns. No falls. Some left shoulder discomfort, OT addressing this. Saw Dr. Letta Pate who was pleased with progress so far, follows up in 6 months.    Pertinent History arthritis, asthma, depression, HTN, neuropathy, suicide attempt, DM, CVA    Patient Stated Goals Pt would  like to be able to walk and use her hands better.    Currently in Pain? Yes    Pain Score 4     Pain Location Shoulder    Pain Orientation Left    Pain Descriptors / Indicators Aching;Discomfort;Sore    Pain Type Chronic pain    Pain Onset More than a month ago    Pain Frequency Constant    Aggravating Factors  certain movements like overhead reaching    Pain Relieving Factors Kinesiotaping                    [facility] Adult PT Treatment/Exercise - 04/27/21 0939       Transfers   Transfers Sit to Stand;Stand to Sit    Sit to Stand 5: Supervision    Stand to Sit 5: Supervision       Ambulation/Gait   Ambulation/Gait Yes    Ambulation/Gait Assistance 5: Supervision    Ambulation/Gait Assistance Details working on sequencing with cane with cues neecded for increased left step length/foot clearance (hip/knee flexion) at times with gait due to foot scuffing noted with swing phase.    Ambulation Distance (Feet) 420 Feet   x1, plus around clinic   Assistive device Straight cane   with rubber quad tip   Gait Pattern Step-through pattern;Decreased stance time - left;Decreased hip/knee flexion - left;Decreased dorsiflexion - left;Decreased arm swing - left    Ambulation Surface Level;Indoor      Knee/Hip Exercises: Aerobic   Nustep level 4 x 6 minutes with LE's only with goal >/= 50 steps per minute for strengthening and activity tolerance                 Balance Exercises - 04/27/21 0955       Balance Exercises: Standing   Rockerboard Anterior/posterior;Lateral;EO;EC;30 seconds;Other reps (comment);Intermittent UE support;Limitations    Rockerboard Limitations on balance board in both directions with no UE support, occasional touch to walls/chair for balance: rocking the board with EO, progressing to EC. Then holding the board steady for EC 30 sec's x 3 reps. min guard to min assist for balance with cues for posture and weight shifting to assist with balance as well.                  PT Short Term Goals - 04/20/21 1049       PT SHORT TERM GOAL #1   Title Pt will increase Berg from 49/56 to >53/56 for improved balance and decreased fall risk.    Baseline 04/20/21 49/56    Time 4    Period Weeks    Status New    Target Date 05/18/21      PT SHORT TERM GOAL #2   Title Pt will ambulate up/down curb and ramp with cane mod I for improved community access.    Time 4    Period Weeks    Status New    Target Date 05/18/21      PT SHORT TERM GOAL #3   Title Pt will increase gait speed to >0.73ms with cane for improved community mobility.    Baseline 0.627m     Time 4    Period Weeks    Status New    Target Date 05/18/21               PT Long Term Goals - 04/20/21 1052       PT LONG TERM GOAL #1   Title Pt will be independent  with progressive HEP for strengthening and balance to continue gains on own. (LTGs due 06/11/21)    Baseline 04/20/21 Pt has been instructed in HEP she is working on . PT continues to add and update.    Time 8    Period Weeks    Status On-going    Target Date 06/11/21      PT LONG TERM GOAL #2   Title Pt will increase DGI from 14/24 to >19/24 for improved balance and gait safety.    Baseline 04/20/21 14/24    Time 8    Period Weeks    Status New    Target Date 06/11/21      PT LONG TERM GOAL #3   Title Pt will ambulate up/down 4 steps with cane in reciprocal pattern for improved community access and functional strength.    Time 8    Period Weeks    Status New    Target Date 06/11/21      PT LONG TERM GOAL #4   Title Pt will ambulate >800' on varied surfaces with cane mod I for improved short community distances.    Baseline 11/1- pt ambulated 550' on varied surface w/ quad tip cane supervision.    Time 8    Period Weeks    Status Revised    Target Date 06/11/21      PT LONG TERM GOAL #5   Title Pt will increase  6 min walk distance to >800' for improved activity tolerance and function.    Baseline 04/20/21 690'    Time 8    Period Weeks    Status New    Target Date 06/11/21      PT LONG TERM GOAL #6   Title FOTO will be completed at d/c to get final measure on self perceived disability.    Baseline 02/16/21 50. 04/20/21 62    Time 8    Period Weeks    Status Revised    Target Date 06/11/21                   Plan - 04/27/21 9629     Clinical Impression Statement Today's skilled session continued to focus on strengthening, gait with cane and balance training with rest breaks taken as needed. No other issues noted or reported in session. The pt is making steady progress toward goals  and should benefit from continued PT to progress toward unmet goals.    Personal Factors and Comorbidities Comorbidity 3+    Comorbidities arthritis, asthma, depression, HTN, neuropathy, suicide attempt, DM    Examination-Activity Limitations Locomotion Level;Transfers;Stand;Stairs;Squat    Examination-Participation Restrictions Community Activity;Driving;Yard Work;Cleaning    Stability/Clinical Decision Making Evolving/Moderate complexity    Rehab Potential Good    PT Frequency 2x / week    PT Duration 8 weeks    PT Treatment/Interventions ADLs/Self Care Home Management;DME Instruction;Neuromuscular re-education;Manual techniques;Balance training;Therapeutic exercise;Therapeutic activities;Functional mobility training;Stair training;Gait training;Vestibular;Passive range of motion;Orthotic Fit/Training;Patient/family education    PT Next Visit Plan continue LLE strengthening and balance training. Gait training with cane with quad tip. Step-ups with LLE working on control. Did pt get foot up brace?    Consulted and Agree with Plan of Care Patient             Patient will benefit from skilled therapeutic intervention in order to improve the following deficits and impairments:  Abnormal gait, Decreased balance, Decreased activity tolerance, Decreased mobility, Decreased strength, Decreased knowledge of use of DME, Decreased endurance, Impaired  sensation  Visit Diagnosis: Muscle weakness (generalized)  Unsteadiness on feet  Other abnormalities of gait and mobility     Problem List Patient Active Problem List   Diagnosis Date Noted   Encounter for loop recorder check 01/02/2021   Loop Biotronik loop implant 12/16/2020 12/16/2020   Leukopenia    Controlled type 2 diabetes mellitus with hyperglycemia, with long-term current use of insulin (HCC)    Vascular headache    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[place]) 11/65/7903   Embolic stroke ([place]) 83/33/8329   Sinus bradycardia on ECG 11/03/2020    Benign essential HTN    Pancytopenia (HCC)    Stage 3b chronic kidney disease ([place])    Anemia    Labile blood glucose    Labile blood pressure    Diabetic peripheral neuropathy ([place])    Essential hypertension    AKI (acute kidney injury) ([place])    Ataxia, late effect of cerebrovascular disease    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[place]) 06/30/2020   Acute CVA (cerebrovascular accident) ([place]) 06/26/2020   CVA (cerebral vascular accident) ([place]) 06/25/2020   BMI 32.0-32.9,adult 06/03/2020   Left upper lobe pneumonia 03/30/2020   Chest pain 10/01/2019   MDD (major depressive disorder), recurrent severe, without psychosis ([place]) 05/09/2019   Suicide attempt ([place]) 05/09/2019   Cocaine abuse ([place]) 05/09/2019   Interstitial lung disease ([place]) 06/21/2016   COPD (chronic obstructive pulmonary disease) ([place]) 06/21/2016   Cough 06/21/2016   GERD (gastroesophageal reflux disease) 06/21/2016   Hypersomnia 06/21/2016   Tobacco user 06/21/2016   Rectal cancer ([place]) 01/30/2013    Willow Ora, PTA, [facility] [name], [place], [place] [address] [phone] 04/28/21, 10:52 AM   Name: Erica [name] MRN: [id number] Date of Birth: 31-Aug-1955

## 2021-04-27 NOTE — Therapy (Signed)
[facility] [address] [place], [place], [address] Phone: [phone]   Fax:  [phone]  Occupational Therapy Treatment  Patient Details  Name: Erica [name] MRN: [id number] Date of Birth: 1956/06/04 Referring Provider (OT): Dr. Letta Pate   Encounter Date: 04/27/2021   OT End of Session - 04/27/21 1018     Visit Number 14    Number of Visits 25    Date for OT Re-Evaluation 05/19/21    Authorization Type Medicare/ Medicaid    Authorization - Visit Number 14    Progress Note Due on Visit 68    OT Start Time 1015    OT Stop Time 1100    OT Time Calculation (min) 45 min    Activity Tolerance Patient tolerated treatment well    Behavior During Therapy [facility] for tasks assessed/performed             Past Medical History:  Diagnosis Date   Arthritis    especially in shoulders   Asthma    Depression    Diabetes mellitus    Embolic stroke ([place]) 9/79/8921   GERD (gastroesophageal reflux disease)    Headache(784.0)    "mild"   Hypertension    Loop Biotronik loop implant 12/16/2020 12/16/2020   Mental disorder    depression   Neuropathy    feet    Pain    arthritis pain - takes tramadol as needed   Rectal polyp    very little bleeding with bowel movements- no pain   Stroke ([place]) 11/03/2020   Suicide attempt [facility])     Past Surgical History:  Procedure Laterality Date   ANAL RECTAL MANOMETRY N/A 07/13/2016   Procedure: ANO RECTAL MANOMETRY;  Surgeon: Leighton Ruff, MD;  [facility];  Service: Endoscopy;  Laterality: N/A;   CESAREAN SECTION     EUS N/A 11/21/2012   Procedure: LOWER ENDOSCOPIC ULTRASOUND (EUS);  Surgeon: Arta Silence, MD;  [facility];  Service: Endoscopy;  Laterality: N/A;   FLEXIBLE SIGMOIDOSCOPY N/A 11/21/2012   Procedure: FLEXIBLE SIGMOIDOSCOPY;  Surgeon: Arta Silence, MD;  [facility];  Service: Endoscopy;  Laterality: N/A;   FLEXIBLE SIGMOIDOSCOPY N/A  01/28/2013   Procedure: FLEXIBLE SIGMOIDOSCOPY;  Surgeon: Leighton Ruff, MD;  [facility];  Service: Endoscopy;  Laterality: N/A;   LAPAROSCOPIC LOW ANTERIOR RESECTION N/A 01/29/2013   Procedure: LAPAROSCOPIC LOW ANTERIOR RESECTION, Rigid Proctoscopy;  Surgeon: Leighton Ruff, MD;  [facility];  Service: General;  Laterality: N/A;   LAPAROSCOPIC SIGMOID COLECTOMY N/A 11/14/2012   Procedure: DIAGNOSTIC LAPAROSCOPY AND SIGMOIDMOIDOSCOPY ;  Surgeon: Rolm Bookbinder, MD;  [facility];  Service: General;  Laterality: N/A;   RECTAL ULTRASOUND N/A 07/13/2016   Procedure: RECTAL ULTRASOUND;  Surgeon: Leighton Ruff, MD;  [facility];  Service: Endoscopy;  Laterality: N/A;   TONSILLECTOMY     TONSILLECTOMY AND ADENOIDECTOMY      There were no vitals filed for this visit.   Subjective Assessment - 04/27/21 1017     Subjective  "not bad for a day's work" (speaking of PT) Pt reports she is going to try and get the cortisone shot next week for LUE shoulder.    Pertinent History Referred by [name] 01/21/21 , post-stroke  History of right parieto-occipital infarct. Most recent stroke 11/03/20. PMH: arthritis, asthma, depression, HTN, neuropathy, suicide attempt, DM    Patient Stated Goals do more stuff around the house    Currently in Pain? Yes  Pain Score 3     Pain Location Shoulder    Pain Orientation Left    Pain Descriptors / Indicators Aching;Discomfort;Sore    Pain Type Chronic pain    Pain Onset 1 to 4 weeks ago    Pain Frequency Constant              Small PegBoard  with LUE for working on coordination and copying visual pattern (colored triangles x 4). Pt with good coordination with min drops but req'd increased time for copying pattern with accuracy. Pt was able to self identify and correct errors without any cues. Removed with in hand manipulation.   Pt reports trying to write things down more and use the memory compensatory strategies to  compensate for memory deficits post CVA.  Functional Reach with Resistance Clothespins 1-8# with LUE - sitting and standing - and reaching to mid to high level on vertical pole/antenna with min verbal cues for shoulder flexion and reducing compensatory movements.   Hand Gripper: with LUE on level 2 with black spring. Pt picked up 1 inch blocks with gripper with mod drops and mod difficulty. Freq rest breaks.                  OT Short Term Goals - 03/30/21 1127       OT SHORT TERM GOAL #1   Title I with inital  HEP- 03/24/21    Time 4    Period Weeks    Status Achieved    Target Date 03/24/21      OT SHORT TERM GOAL #2   Title I with sensory precautions    Time 4    Period Weeks    Status Achieved   pt  verbalized understanding of precautions 03/23/21     OT SHORT TERM GOAL #3   Title Pt will perform basic home mangement with supervision    Time 4    Period Weeks    Status Achieved   pt reports doing laundry consistently from w/c level in apt 03/23/21     OT SHORT TERM GOAL #4   Title Pt will perform basic cooking with supervision demonstrating good safety awareness.    Time 4    Period Weeks    Status On-going   03/17/21, supervision and min v.c for walker safety     OT SHORT TERM GOAL #5   Title Pt will perform all basic ADLS modified independently.    Time 4    Period Weeks    Status Achieved   goes to beauty shop to get hair shampooed 03/10/21     OT SHORT TERM GOAL #6   Title Pt will retrieve a lightweight object at 115 shoulder flexion with LUE without dropping    Baseline 105*    Time 4    Period Weeks    Status Achieved   min v/c for decreasing abduction 03/23/21              OT Long Term Goals - 04/13/21 1238       OT LONG TERM GOAL #1   Title I with updated HEP. 05/19/21    Time 12    Period Weeks    Status On-going      OT LONG TERM GOAL #2   Title Pt will demonstrate improved fine motor coordination for ADLs as evidenced  by  decreasing LUE 9 hole peg test to 28 secs without drops.    Time 12  Period Weeks    Status On-going   28.87, improved but not yet met     OT LONG TERM GOAL #3   Title Pt will demonstrate ability to retrieve a 2 lbs object from overhead shelf without drops with LUE.    Time 12    Period Weeks    Status On-going      OT LONG TERM GOAL #4   Title Pt will perform all basic ADLs modified Indpendently.    Time 12    Period Weeks    Status Achieved      OT LONG TERM GOAL #5   Title Pt will perfom basic home management/ cooking mod I with good safety awareness.    Time 12    Period Weeks    Status Achieved      OT LONG TERM GOAL #6   Title I with memory compensation strategies.    Time 12    Period Weeks    Status On-going                   Plan - 04/27/21 1026     Clinical Impression Statement Pt continues to make progress and reports increased independence with ADLs and IADLs at home. Pt reports continuing to drop some things with LUE.    OT Occupational Profile and History Detailed Assessment- Review of Records and additional review of physical, cognitive, psychosocial history related to current functional performance    Occupational performance deficits (Please refer to evaluation for details): ADL's;IADL's;Leisure    Body Structure / Function / Physical Skills ADL;Endurance;UE functional use;Balance;Flexibility;FMC;Gait;ROM;GMC;Coordination;Decreased knowledge of precautions;Sensation;IADL;Decreased knowledge of use of DME;Dexterity;Mobility    Cognitive Skills Attention;Memory    Rehab Potential Good    Clinical Decision Making Limited treatment options, no task modification necessary    Comorbidities Affecting Occupational Performance: May have comorbidities impacting occupational performance    Modification or Assistance to Complete Evaluation  No modification of tasks or assist necessary to complete eval    OT Frequency 2x / week   plus eval, will likely d/c  after 8 weeks dependent on progress   OT Duration 12 weeks    OT Treatment/Interventions Self-care/ADL training;Ultrasound;Patient/family education;DME and/or AE instruction;Gait Training;Passive range of motion;Balance training;Fluidtherapy;Cryotherapy;Electrical Stimulation;Therapist, nutritional;Therapeutic activities;Manual Therapy;Therapeutic exercise;Cognitive remediation/compensation;Neuromuscular education    Plan start to check goals and see how progressing, home mangement, LUE reach    Consulted and Agree with Plan of Care Patient             Patient will benefit from skilled therapeutic intervention in order to improve the following deficits and impairments:   Body Structure / Function / Physical Skills: ADL, Endurance, UE functional use, Balance, Flexibility, FMC, Gait, ROM, GMC, Coordination, Decreased knowledge of precautions, Sensation, IADL, Decreased knowledge of use of DME, Dexterity, Mobility Cognitive Skills: Attention, Memory     Visit Diagnosis: Muscle weakness (generalized)  Unsteadiness on feet  Other lack of coordination  Attention and concentration deficit    Problem List Patient Active Problem List   Diagnosis Date Noted   Encounter for loop recorder check 01/02/2021   Loop Biotronik loop implant 12/16/2020 12/16/2020   Leukopenia    Controlled type 2 diabetes mellitus with hyperglycemia, with long-term current use of insulin (HCC)    Vascular headache    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[place]) 16/03/9603   Embolic stroke ([place]) 54/02/8118   Sinus bradycardia on ECG 11/03/2020   Benign essential HTN    Pancytopenia ([place])    Stage 3b  chronic kidney disease ([place])    Anemia    Labile blood glucose    Labile blood pressure    Diabetic peripheral neuropathy ([place])    Essential hypertension    AKI (acute kidney injury) ([place])    Ataxia, late effect of cerebrovascular disease    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   Acute CVA (cerebrovascular  accident) ([place]) 06/26/2020   CVA (cerebral vascular accident) ([place]) 06/25/2020   BMI 32.0-32.9,adult 06/03/2020   Left upper lobe pneumonia 03/30/2020   Chest pain 10/01/2019   MDD (major depressive disorder), recurrent severe, without psychosis ([place]) 05/09/2019   Suicide attempt ([employer]) 05/09/2019   Cocaine abuse ([place]) 05/09/2019   Interstitial lung disease ([place]) 06/21/2016   COPD (chronic obstructive pulmonary disease) ([place]) 06/21/2016   Cough 06/21/2016   GERD (gastroesophageal reflux disease) 06/21/2016   Hypersomnia 06/21/2016   Tobacco user 06/21/2016   Rectal cancer ([place]) 01/30/2013    Zachery Conch, OT/L 04/27/2021, 11:02 AM  [facility] [address] [place], [place], [address] Phone: [phone]   Fax:  [phone]  Name: OVETA IDRIS MRN: [id number] Date of Birth: 11-20-1955

## 2021-04-28 ENCOUNTER — Encounter: Payer: Self-pay | Admitting: Internal Medicine

## 2021-04-28 ENCOUNTER — Ambulatory Visit (INDEPENDENT_AMBULATORY_CARE_PROVIDER_SITE_OTHER): Payer: Medicare HMO | Admitting: Internal Medicine

## 2021-04-28 VITALS — BP 124/88 | HR 77 | Ht 63.0 in | Wt 183.0 lb

## 2021-04-28 DIAGNOSIS — Z5181 Encounter for therapeutic drug level monitoring: Secondary | ICD-10-CM | POA: Diagnosis not present

## 2021-04-28 DIAGNOSIS — K521 Toxic gastroenteritis and colitis: Secondary | ICD-10-CM

## 2021-04-28 DIAGNOSIS — J84112 Idiopathic pulmonary fibrosis: Secondary | ICD-10-CM

## 2021-04-28 DIAGNOSIS — Z23 Encounter for immunization: Secondary | ICD-10-CM | POA: Diagnosis not present

## 2021-04-28 DIAGNOSIS — D649 Anemia, unspecified: Secondary | ICD-10-CM

## 2021-04-28 DIAGNOSIS — J849 Interstitial pulmonary disease, unspecified: Secondary | ICD-10-CM

## 2021-04-28 DIAGNOSIS — J439 Emphysema, unspecified: Secondary | ICD-10-CM | POA: Diagnosis not present

## 2021-04-28 DIAGNOSIS — N189 Chronic kidney disease, unspecified: Secondary | ICD-10-CM

## 2021-04-28 LAB — BASIC METABOLIC PANEL
BUN: 18 mg/dL (ref 6–23)
CO2: 26 mEq/L (ref 19–32)
Calcium: 9.2 mg/dL (ref 8.4–10.5)
Chloride: 107 mEq/L (ref 96–112)
Creatinine, Ser: 1.42 mg/dL — ABNORMAL HIGH (ref 0.40–1.20)
GFR: 38.45 mL/min — ABNORMAL LOW (ref >60.00)
Glucose, Bld: 161 mg/dL — ABNORMAL HIGH (ref 70–99)
Potassium: 4.4 mEq/L (ref 3.5–5.1)
Sodium: 139 mEq/L (ref 135–145)

## 2021-04-28 LAB — HEPATIC FUNCTION PANEL
ALT: 11 U/L (ref 0–35)
AST: 19 U/L (ref 0–37)
Albumin: 3.8 g/dL (ref 3.5–5.2)
Alkaline Phosphatase: 72 U/L (ref 39–117)
Bilirubin, Direct: 0.1 mg/dL (ref 0.0–0.3)
Total Bilirubin: 0.6 mg/dL (ref 0.2–1.2)
Total Protein: 7.3 g/dL (ref 6.0–8.3)

## 2021-04-28 LAB — CBC WITH DIFFERENTIAL/PLATELET
Basophils Absolute: 0 10*3/uL (ref 0.0–0.1)
Basophils Relative: 0.2 % (ref 0.0–3.0)
Eosinophils Absolute: 0.1 10*3/uL (ref 0.0–0.7)
Eosinophils Relative: 1.5 % (ref 0.0–5.0)
HCT: 31.7 % — ABNORMAL LOW (ref 36.0–46.0)
Hemoglobin: 10.8 g/dL — ABNORMAL LOW (ref 12.0–15.0)
Lymphocytes Relative: 25.5 % (ref 12.0–46.0)
Lymphs Abs: 1.4 10*3/uL (ref 0.7–4.0)
MCHC: 34.1 g/dL (ref 30.0–36.0)
MCV: 95.4 fl (ref 78.0–100.0)
Monocytes Absolute: 0.3 10*3/uL (ref 0.1–1.0)
Monocytes Relative: 6.4 % (ref 3.0–12.0)
Neutro Abs: 3.5 10*3/uL (ref 1.4–7.7)
Neutrophils Relative %: 66.4 % (ref 43.0–77.0)
Platelets: 149 10*3/uL — ABNORMAL LOW (ref 150.0–400.0)
RBC: 3.32 Mil/uL — ABNORMAL LOW (ref 3.87–5.11)
RDW: 16.2 % — ABNORMAL HIGH (ref 11.5–15.5)
WBC: 5.3 10*3/uL (ref 4.0–10.5)

## 2021-04-28 NOTE — Patient Instructions (Addendum)
ICD-10-CM   1. IPF (idiopathic pulmonary fibrosis) ([place])  J84.112     2. Pulmonary emphysema, unspecified emphysema type ([place])  J43.9     3. Encounter for therapeutic drug monitoring  Z51.81     4. Diarrhea due to drug  K52.1     5. Anemia, unspecified type  D64.9     6. Chronic kidney disease, unspecified CKD stage  N18.9     7. Flu vaccine need  Z23         Clinically pulmonary fibrosis is stable.   There is some amount of diarrhea because of pirfenidone -but overall tolerating pirfenidone well YOu also have chronic anemia [last documented June 2022] with hemoglobin 10 g% He also have chronic kidney disease creatinine 1.7 mg percent [last documented September 2022]   Plan -Check CBC, chemistry, liver function test today --Continue pirfenidone at 3 pills 3 times daily with food --Continue inhaler therapy Spiriva and Symbicort scheduled for your emphysema [take refills] --Do high-resolution CT chest supine and prone in 6 months (last oct 2020) -High-dose flu shot today   Follow-up -Return in 6 months to see Dr. Chase Caller but  high-resolution CT chest -30-minute slot

## 2021-04-28 NOTE — Progress Notes (Signed)
This is the case of Erica [name], 65 y.o. Female, who was referred by Dr. Lorella [name] in consultation regarding abnormal chest ct scan.   As you very well know, patient smoked 1 pack per week since her 55s, curently down to a cigarette a day, was diagnosed with asthma/copd several yrs ago. Uses alb MDI prn, usually 1-2 x/week. She gets more than SOB with more than ADLs. She is not on o2.   She had rectal CA in 2014 for which she had surgery. Still in remission.  She had a chest ct scan in 01/2015 as part of CA surveillance.  That showed several pulm nodules (subcm) which have been stable, and some smaller.  Also with increased markings at the bases.   She had sinus issues in December.  Wen tot ER as she was not getting better.  CXR showed inc interstitial markings, hence the consult today. She is almost back to her baseline.   She has GERD, controlled with meds. She coughs daily, coughs in sleep. Cough worse at night.  On and off wheezing at HS.   Her sinus issues are better. Has PND.    Has snoring, gaspin, choking, unrefreshed sleep.  Wakes up tired and fatigued.  ESS 10. Hypersomnia affects her fxnality.    ROV  08/12/16 Patient returns to the office as follow-up after her tests. Since last seen, she has quit smoking. She had PFT  which showed mild COPD with an FEV1 of 1. 72 or 84% of predicted. Diffusion capacity was 61%. Her sleep study was (-) for OSA. She is able to do her ADLs without much difficulty. Gets winded with more than ADLs. Her reflux is controlled with medicines and diet changes. Her cough is also stable.     09/06/2017 Follow up : COPD and ILD  Pt returns for 1 year follow up . She was last seen in March 2018 . Found to have mild COPD and ILD changes on CT chest that are mild worse than 2015 CT scan .  She had autoimmune labs that essentially neg. She did not keep follow up .  Recommended on smoking cessation.  Says that she has baseline dyspnea with activity  none at rest , and dry cough .  Over last year feels dyspnea with activity has gotten worse. She gets winded easily .  No increased cough .  Still smoking, cessation discussed.   OV 10/24/2017  Chief Complaint  Patient presents with   Follow-up    PFT was attempted today but pt unable to complete due to some sinus problems. Pt has some yellow to green mucus postnasally and also is coughing up a lot of mucus as well. Pt also states she has increased SOB due to symptoms.    Erica [name] presents to the interstitial lung disease clinic for evaluation of potential interstitial lung disease.  She is followed in the general pulmonary clinic as stated above.  Her other issues ongoing smoking.  She is somewhat of a poor historian and as soon as I walked in she reported a new complaint of acute sinus congestion for the last 2 weeks after having picked up a cold from her sister.  Also the ongoing spring pollen season is making things worse.  She is coughing up some green sputum.  She feels this needs to be treated by me and she wants an antibiotic for this.  There is no associated wheezing or cough or shortness of breath  because of this.  We discussed her underlying lung disease and she tells me that she is aware that she has COPD/emphysema.  But she is totally unaware about pulmonary fibrosis or interstitial lung disease.  She tells me that overall compared to last year she is stable and she only has mild dyspnea on exertion without much of a cough.  There is no chest pain no orthopnea proximal nocturnal dyspnea wheezing or hemoptysis or weight loss.  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  Only takes albuerol as needed.  Her last CT scan of the chest high resolution was in January 2018 that thoracic radiology Dr. Lorin [name] described as indicative of UIP.  I personally visualized the CT and I personally think this is indeterminate for UIP.  There is bilateral  bibasal craniocaudal gradients reticular abnormalities but I am not so sure that these are subpleural and more of I do not see clear-cut traction bronchiectasis or honeycombing although I will agree that there is no groundglass.   She has ongoing smoking    Results for Erica, [name] (MRN [id number]) as of 10/24/2017 16:28  Ref. Range 08/12/2016 14:23 09/07/2016 11:20  Anit Nuclear Antibody(ANA) Latest Ref Range: NEGATIVE  NEG   ANCA Proteinase 3 Latest Ref Range: 0.0 - 3.5 U/mL  <3.5  Anti JO-1 Latest Ref Range: 0.0 - 0.9 AI <7.5 <8.8  Cyclic Citrullin Peptide Ab Latest Units: Units <16   ds DNA Ab Latest Units: IU/mL <1   Myeloperoxidase Ab Latest Ref Range: 0.0 - 9.0 U/mL  <9.0  Cytoplasmic (C-ANCA) Latest Ref Range: <1:20  1:40 (H)   Scleroderma (Scl-70) (ENA) Antibody, IgG Latest Ref Range: <1.0 NEG AI  <1.0 NEG        OV 04/08/2019  Subjective:  Patient ID: Erica [name], female , DOB: 19-Feb-1956 , age 48 y.o. , MRN: [id number] , ADDRESS: [place] [id number]   04/08/2019 -   Chief Complaint  Patient presents with   Follow-up    Patient reports that she still has sob with exertion and some cough. She states that some days are better than others.    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 -returns for follow-up.  Last seen by myself in May 2019.  At that point in time I ordered a high-resolution CT chest.  She had it but has not properly followed up.  She in between had a telephone visit with one of the nurse practitioners and was given COPD medication refill.  After that she had a visit with another nurse practitioner.  She tells me that in the last year she is quit smoking.  She is also quit working because of dyspnea on exertion relieved by rest.  She tells me that since last year her dyspnea is progressive.  She is able to climb 1 flight of stairs and then has to  stop because of extreme shortness of breath.  She is continuing with her inhalers.  She does not have nighttime or daytime oxygen.  Review of the CT scan and visualization of the CT scan from 1 year ago shows UIP features.  There are no other new issues.     CLINICAL DATA:  Interstitial lung disease.   EXAM: CT CHEST WITHOUT CONTRAST - 04/09/2018   TECHNIQUE: Multidetector CT imaging of the chest was performed following  the standard protocol without intravenous contrast. High resolution imaging of the lungs, as well as inspiratory and expiratory imaging, was performed.   COMPARISON:  07/06/2016, PET 05/14/2015 and CT chest 01/12/2015.   FINDINGS: Cardiovascular: Coronary artery calcification. Heart is at the upper limits of normal in size to mildly enlarged. No pericardial effusion.   Mediastinum/Nodes: Mediastinal lymph nodes measure up to 1.2 cm in the low right paratracheal station, as before. Hilar regions are difficult to evaluate without IV contrast. No axillary adenopathy. Mild esophageal dilatation. Esophagus is otherwise unremarkable.   Lungs/Pleura: Worsening peripheral and basilar predominant subpleural reticulation, ground-glass, traction bronchiectasis/bronchiolectasis and probable honeycombing. No air trapping. Perifissural nodules measure up to 8 x 13 mm along the left major fissure, minimally increased from 07/06/2016 with still felt to represent subpleural lymph nodes. No pleural fluid. Airway is unremarkable.   Upper Abdomen: Visualized portion of the liver is unremarkable. Stones layer in the gallbladder. Visualized portions of the adrenal glands, kidneys, spleen, pancreas, stomach and bowel are grossly unremarkable. There may be a small hiatal hernia. Upper abdominal lymph nodes are not enlarged by CT size criteria.   Musculoskeletal: Degenerative changes in the spine.   IMPRESSION: 1. Progressive pulmonary parenchymal pattern of fibrosis,  as described above. Findings are consistent with UIP per consensus guidelines: Diagnosis of Idiopathic Pulmonary Fibrosis: An Official ATS/ERS/JRS/ALAT Clinical Practice Guideline. [place], Iss 5, [phone], Feb 11 2017. 2. Cholelithiasis. 3. Coronary artery calcification.    ROS  OV 06/18/2019  Subjective:  Patient ID: Erica [name], female , DOB: 30-Oct-1955 , age 28 y.o. 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 -follow-up for her chest CT and PFT results.  In the interim denies any complaints.  Her symptom score is listed below.  She does not have any chest pain with exertion.  In talking to she only reports the symptoms.  I had to press her multiple different ways to really know if she understands what is going on in terms of etiologies.  After much conversation she currently at her COPD.  She had no idea about pulmonary fibrosis or interstitial lung disease.  I used several different colloquial terms.  This has been explained to her in the past.  But she understands she is got progressive shortness of breath and this is due to her lung disease.  So we went over the concept of pulmonary fibrosis again.  Her CT shows progressive UIP pattern of pulmonary fibrosis.  His previous serologies are negative.  She has coronary artery calcification and emphysema.  Her symptom burden is listed below.  Her walking desaturation test shows progression.  She is not had a stress test in a long time.  She does not have any chest pains  She says she had overnight oxygen study but we do not have those results with [place].          COMPARISON:  High-resolution chest CT 10/2  01/2018.    FINDINGS: Cardiovascular: Heart size is normal. There is no significant pericardial fluid, thickening or pericardial calcification. There is aortic atherosclerosis, as well as atherosclerosis of the great vessels of the  mediastinum and the coronary arteries, including calcified atherosclerotic plaque in the left anterior descending coronary arteries.   Mediastinum/Nodes: No pathologically enlarged mediastinal or hilar lymph nodes. Please note that accurate exclusion of hilar adenopathy is limited on noncontrast CT scans. Esophagus is unremarkable in appearance. No axillary lymphadenopathy.   Lungs/Pleura: High-resolution images demonstrate widespread patchy areas of ground-glass attenuation, septal thickening, thickening of the peribronchovascular interstitium, cylindrical bronchiectasis, peripheral bronchiolectasis and areas of honeycombing. These findings have a definitive craniocaudal gradient and arm minimally progressive compared to the prior study. Inspiratory and expiratory imaging is unremarkable. No acute consolidative airspace disease. No pleural effusions. No suspicious appearing pulmonary nodules or masses are noted.   Upper Abdomen: Partially calcified gallstone in the neck of the gallbladder.   Musculoskeletal: There are no aggressive appearing lytic or blastic lesions noted in the visualized portions of the skeleton.   IMPRESSION: 1. The appearance of the lungs remains compatible with interstitial lung disease, with a spectrum of findings considered diagnostic of usual interstitial pneumonia (UIP) per current ATS guidelines. Minimal progression compared to the prior study. 2. Aortic atherosclerosis, in addition to left anterior descending coronary artery disease. Please note that although the presence of coronary artery calcium documents the presence of coronary artery disease, the severity of this disease and any potential stenosis cannot be assessed  on this non-gated CT examination. Assessment for potential risk factor modification, dietary therapy or pharmacologic therapy may be warranted, if clinically indicated. 3. Cholelithiasis.   Aortic Atherosclerosis (ICD10-I70.0).     Electronically Signed   By: Vinnie Langton M.D.   On: 04/12/2019 16:30  xxxxxxxxxxxxxxxxxxxxxxxxxxxxxxxxxxxxxxxxxxxxxxxxxxxxxxxxxxxx     OV 08/15/2019  Subjective:  Patient ID: Erica [name], female , DOB: 02/20/1956 , age 86 y.o. , MRN: [id number] , ADDRESS: [place] [id number]   08/15/2019 -   Chief Complaint  Patient presents with   Follow-up    Pt states she is some better since last visit. Pt still becomes SOB with exertion and will have occ SOB if not doing anything. Pt also has an occ cough but denies any CP.   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  IPF diagnosis given June 18, 2019.  Commenced on pirfenidone January 2021.  Ex-smoker  GERD  Last CT scan of the chest October 2020.  [name] 65 y.o. -presents for follow-up with her sister.  She feels she is slowly getting worse.  Last visit early January 2021 started on pirfenidone given the diagnosis of IPF.  Overnight pulse ox study was normal in December 2020 so she is not on oxygen.  She tells me that she feels she is getting slowly worse.  A few months ago she could climb the 20 steps to her apartment stopping only once.  Currently she is stopping 2 times.  Her sister is extremely worried about the apartment situation because it is on the first or second floor.  And there is one flight of stairs to climb.  Also after she started the pirfenidone she had tolerance issues with GI symptoms.  And she was reduced to 2 tablets 3 times daily.  She is now back up at 3 pills 3 times daily and is tolerating this just fine.  Symptom scores are listed below walking desaturation test suggest that she is indeed getting worse.  She is seeing Dr.  Einar Gip for coronary artery calcification.  Notes reviewed.    SYMPTOM SCALE - ILD 06/18/2019 188#  08/15/2019 195#  O2 use ra ra  Shortness of Breath 0 -> 5 scale with 5 being worst (score 6 If unable to do)   At rest 1 3  Simple tasks - showers, clothes change, eating, shaving 2 3  Household (dishes, doing bed, laundry) 2 4  Shopping 2 4  Walking level at own pace 2 4  Walking up Stairs 3 2  Total (40 - 48) Dyspnea Score 13 20  How bad is your cough? 4 x  How bad is your fatigue 4 4  nausea  3  vomit  3  diarrhea  3  anxiety  3  depression  2    2    Simple office walk 185 feet x  3 laps goal with forehead probe 10/24/2017  04/08/2019  06/18/2019 188# 08/15/2019   O2 used Room air Room air Room air Room air  Number laps completed _0 Comments about pace Normal pace   avgg  Resting Pulse Ox/HR 97% and 77/min 100% 95% and 91/min 96% and 101/min  Final Pulse Ox/HR 97% and 103/min 97% and 105/min 90% and 106/min 90% and 122/min  Desaturated </= 88% no no no no  Desaturated <= 3% points no Yes, 3 points Yes, 3 poitns Yes, 6 points  Got Tachycardic >/= 90/min yes yes yes yes  Symptoms at end of test No comment x No dyspnea Moderate duspnea  Miscellaneous comments No comment from CMA x x ? worse     Results for Erica, [name] (MRN [id number]) as of 06/18/2019 12:12  Ref. Range 08/11/2016 10:13 10/24/2017 14:58 06/13/2019 08:56  FVC-Pre Latest Units: L 2.56 2.79 2.52  FVC-%Pred-Pre Latest Units: % 98 108 98    Results for JENETTE, RAYSON A (MRN [id number]) as of 06/18/2019 12:12  Ref. Range 08/11/2016 10:13 10/24/2017 14:58 06/13/2019 08:56  DLCO unc Latest Units: ml/min/mmHg 14.81  7.70  DLCO unc % pred Latest Units: % 61  38    ROS - per HPI    OV 09/03/2020  Subjective:  Patient ID: Erica [name], female , DOB: 1956/05/18 , age 6 y.o. , MRN: [id number] , ADDRESS: [address] [place] [address] PCP Nolene Ebbs, MD Patient Care Team: Nolene Ebbs, MD as PCP -  General (Internal Medicine) Truitt Merle, MD as Consulting Physician (Hematology) Leighton Ruff, MD as Consulting Physician (General Surgery) Arta Silence, MD as Consulting Physician (Gastroenterology)  This Provider for this visit: Treatment Team:  Attending Provider: Brand Males, MD    09/03/2020 -   Chief Complaint  Patient presents with   Follow-up    Pt states shortness of breath with activity, reports productive cough with improvement, goldish phlegm.     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  IPF diagnosis given June 18, 2019.  Commenced on pirfenidone January 2021.  Ex-smoker  GERD  Last CT scan of the chest October 2020.  [name] 65 y.o. -follow-up IPF with associated emphysema.  She is on pirfenidone 3 pills 3 times daily.  She is having some diarrhea and GI side effects.  Her companion was with her asked if they could reduce the dose.  Did indicate that reducing the dose can help reduce side effects but also with lowered benefit profile.  Therefore they decided to stay on the full dose.  From a respiratory standpoint she is stable as documented by the symptom score below but she is lost weight.  She had a  stroke in January 2022.  She is using a walker.  She feels numb on the face but on gross testing here not much of focal neurologic deficit other than mild pronator drift in the left upper extremity.  She is asking for refills on her inhalers.  We did discuss briefly the concept of clinical trials as a care option.  She indicated she will reflect upon this.           OV 04/28/2021  Subjective:  Patient ID: Erica [name], female , DOB: 10/17/1955 , age 89 y.o. , MRN: [id number] , ADDRESS: [place] [id number] PCP Nolene Ebbs, MD Patient Care Team: Nolene Ebbs, MD as PCP - General (Internal Medicine) Truitt Merle, MD as Consulting Physician (Hematology) Leighton Ruff, MD as Consulting Physician  (General Surgery) Arta Silence, MD as Consulting Physician (Gastroenterology)  This Provider for this visit: Treatment Team:  Attending Provider: Brand Males, MD    04/28/2021 -   Chief Complaint  Patient presents with   Follow-up    Generic brand of Esbriet patient claims is not working as good.    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  IPF diagnosis given June 18, 2019.  Commenced on pirfenidone January 2021.  Ex-smoker  GERD  Last CT scan of the chest October 2020.  [name] 65 y.o. -presents with her Sister [name].  She continues to use walker.  She is doing well.  She is on generic pirfenidone since May 2022.  She tells me the generic pirfenidone initially made her wheeze but then since then she has improved.  Review of the labs indicate that her chronic anemia last checked hemoglobin 10 g%.  She has chronic kidney disease with a GFR in the 30s and creatinine 1.7 mg percent.  She has been on this creatinine for a while.  Despite that she is tolerating the pirfenidone at full dose quite well.  The wheezes resolved.  Her shortness of breath is stable.  She is not using oxygen.  She had pulmonary function test in August 2022 and it shows stability.  Her last CT scan of the chest was in October 2020.  She has some diarrhea.      SYMPTOM SCALE - ILD 06/18/2019 188# 08/15/2019 195# 09/03/2020 187# 04/28/2021 183#  - esbriet  O2 use ra ra ra ra  Shortness of Breath 0 -> 5 scale with 5 being worst (score 6 If unable to do)     At rest _0 Simple tasks - showers, clothes change, eating, shaving _1 Household (dishes, doing bed, laundry) _2 Shopping _3 Walking level at own pace _4 Walking up Stairs _5 Total (40 - 48) Dyspnea Score _6 How bad is your cough? 4 x 3 5  How bad is your fatigue _7 nausea  _8 vomit  _9 diarrhea  _10 anxiety  _11 depression  2 3 Did not answer    2 3      Simple office walk 185 feet x  3 laps goal with forehead probe 10/24/2017  04/08/2019  06/18/2019 188# 08/15/2019  09/03/2020  04/28/2021   O2 used Room air Room air Room air Room air ra ra  Number laps completed 3  _0 of 3 duie to stroke in [place] and walker used 1 of 3 only due to walker and prior strke  Comments about pace Normal pace   avgg    Resting Pulse Ox/HR 97% and 77/min 100% 95% and 91/min 96% and 101/min 100% and 63 99% and 73  Final Pulse Ox/HR 97% and 103/min 97% and 105/min 90% and 106/min 90% and 122/min 96% and 85 96% and 90  Desaturated </= 88% _1    Desaturated <= 3% points no Yes, 3 points Yes, 3 poitns Yes, 6 points yes   Got Tachycardic >/= 90/min yes yes yes yes no   Symptoms at end of test No comment x No dyspnea Moderate duspnea Mild dyspena   Miscellaneous comments No comment from CMA x x ? worse      PFT  PFT Results Latest Ref Rng & Units 01/26/2021 06/03/2020 06/13/2019 10/24/2017 08/11/2016  FVC-Pre L 2.79 2.59 2.52 2.79 2.56  FVC-Predicted Pre % 112 102 98 108 98  FVC-Post L - - 2.63 - 2.99  FVC-Predicted Post % - - 102 - 114  Pre FEV1/FVC % % 82 82 81 69 67  Post FEV1/FCV % % - - 81 - 67  FEV1-Pre L 2.29 2.12 2.05 1.93 1.72  FEV1-Predicted Pre % 117 107 102 95 84  FEV1-Post L - - 2.13 - 2.01  DLCO uncorrected ml/min/mmHg 7.71 8.38 7.70 - 14.81  DLCO UNC% % 39 42 38 - 61  DLCO corrected ml/min/mmHg 7.71 8.38 - - 14.73  DLCO COR %Predicted % 39 42 - - 60  DLVA Predicted % 50 53 47 - 66  TLC L - - 4.48 - 5.31  TLC % Predicted % - - 88 - 105  RV % Predicted % - - 75 - 136   Results for Erica, [name] (MRN [id number]) as of 04/28/2021 11:31  Ref. Range 11/10/2020 05:52 11/13/2020 04:58 11/17/2020 05:01 11/20/2020 12:51 11/30/2020 15:05 03/08/2021 11:42 03/08/2021 11:43 03/08/2021 11:44  Creatinine Latest Ref Range: 0.57 - 1.00 mg/dL  1.73 (H) 1.74 (H) 1.78 (H) 1.50 (H)   1.72 (H)   Results for Erica, [name] (MRN [id number]) as of 04/28/2021 11:31   Ref. Range 11/10/2020 05:52 11/13/2020 04:58 11/17/2020 05:01 11/20/2020 12:51 11/30/2020 15:05 03/08/2021 11:42 03/08/2021 11:43 03/08/2021 11:44  GFR, Estimated Latest Ref Range: >60 mL/min  32 (L) 32 (L) 31 (L)      Results for Erica, [name] (MRN [id number]) as of 04/28/2021 11:31  Ref. Range 11/10/2020 05:52 11/13/2020 04:58 11/17/2020 05:01 11/20/2020 12:51 11/30/2020 15:05 03/08/2021 11:42 03/08/2021 11:43 03/08/2021 11:44  Hemoglobin Latest Ref Range: 11.7 - 15.5 g/dL 10.3 (L) 10.3 (L) 10.9 (L)  9.8 (L)        has a past medical history of Arthritis, Asthma, Depression, Diabetes mellitus, Embolic stroke ([place]) (12/10/5282), GERD (gastroesophageal reflux disease), Headache(784.0), Hypertension, Loop Biotronik loop implant 12/16/2020 (12/16/2020), Mental disorder, Neuropathy, Pain, Rectal polyp, Stroke ([place]) (11/03/2020), and Suicide attempt ([place]).   reports that she quit smoking about 3 years ago. Her smoking use included cigarettes. She has a 10.00 pack-year smoking history. She has never used smokeless tobacco.  Past Surgical History:  Procedure Laterality Date   ANAL RECTAL MANOMETRY N/A 07/13/2016   Procedure: ANO RECTAL MANOMETRY;  Surgeon: Leighton Ruff, MD;  Location: [facility];  Service: Endoscopy;  Laterality: N/A;   CESAREAN SECTION     EUS N/A 11/21/2012   Procedure: LOWER ENDOSCOPIC ULTRASOUND (EUS);  Surgeon: Arta Silence, MD;  Location: [facility];  Service: Endoscopy;  Laterality: N/A;   FLEXIBLE SIGMOIDOSCOPY N/A 11/21/2012   Procedure: FLEXIBLE SIGMOIDOSCOPY;  Surgeon: Arta Silence, MD;  Location: [facility];  Service: Endoscopy;  Laterality: N/A;   FLEXIBLE SIGMOIDOSCOPY N/A 01/28/2013   Procedure: FLEXIBLE SIGMOIDOSCOPY;  Surgeon: Leighton Ruff, MD;  Location: [facility];  Service: Endoscopy;  Laterality: N/A;   LAPAROSCOPIC LOW ANTERIOR RESECTION N/A 01/29/2013   Procedure: LAPAROSCOPIC LOW ANTERIOR RESECTION, Rigid Proctoscopy;  Surgeon: Leighton Ruff, MD;  Location: [facility];  Service:  General;  Laterality: N/A;   LAPAROSCOPIC SIGMOID COLECTOMY N/A 11/14/2012   Procedure: DIAGNOSTIC LAPAROSCOPY AND SIGMOIDMOIDOSCOPY ;  Surgeon: Rolm Bookbinder, MD;  Location: [facility];  Service: General;  Laterality: N/A;   RECTAL ULTRASOUND N/A 07/13/2016   Procedure: RECTAL ULTRASOUND;  Surgeon: Leighton Ruff, MD;  Location: [facility];  Service: Endoscopy;  Laterality: N/A;   TONSILLECTOMY     TONSILLECTOMY AND ADENOIDECTOMY      Allergies  Allergen Reactions   Penicillins Hives, Itching and Swelling    Tongue swells up Has patient had a PCN reaction causing immediate rash, facial/tongue/throat swelling, SOB or lightheadedness with hypotension: Yes Has patient had a PCN reaction causing severe rash involving mucus membranes or skin necrosis: Yes Has patient had a PCN reaction that required hospitalization Yes Has patient had a PCN reaction occurring within the last 10 years: No If all of the above answers are "NO", then may proceed with Cephalosporin use.     Immunization History  Administered Date(s) Administered   Influenza Split 03/13/2017   Influenza Whole 04/26/2018   Influenza, High Dose Seasonal PF 02/29/2020   Influenza,inj,Quad PF,6+ Mos 03/09/2019   Influenza-Unspecified 03/13/2016   PFIZER(Purple Top)SARS-COV-2 Vaccination 08/19/2019, 09/18/2019    Family History  Problem Relation Age of Onset   Hypertension Father    Stroke Father    Diabetes Father    Heart disease Mother    Hypertension Mother    Hyperlipidemia Mother    Hypertension Sister    Hypertension Brother    Hypertension Sister    Hypertension Brother    Cancer Other 28       colon cancer    Cancer Maternal Aunt        cancer, unknown type    Cancer Maternal Uncle        bone cancer    Cancer Paternal Aunt        lung cancer   Cancer Paternal Uncle        lung cancer   Cancer Maternal Uncle        colon cancer and prostate cancer    Cancer Maternal Uncle        prostate cancer       Current Outpatient Medications:    acetaminophen (TYLENOL) 325 MG tablet, Take 2 tablets (650 mg total) by mouth every 4 (four) hours as needed for mild pain (or temp > 37.5 C (99.5 F))., Disp: , Rfl:    albuterol (VENTOLIN HFA) 108 (90 Base) MCG/ACT inhaler, Inhale 2 puffs into the lungs every 4 (four) hours as needed for wheezing., Disp: 1 g, Rfl: 5   amLODipine (NORVASC) 10 MG tablet, Take 1 tablet (10 mg total) by mouth daily., Disp: 30 tablet, Rfl: 2   aspirin EC 81 MG EC tablet, Take 1 tablet (81 mg total) by mouth daily. Swallow whole., Disp: 30 tablet, Rfl: 11   busPIRone (BUSPAR) 10 MG tablet, Take 1 tablet (10 mg  total) by mouth 2 (two) times daily., Disp: 60 tablet, Rfl: 0   diclofenac Sodium (VOLTAREN) 1 % GEL, Apply 2 g topically daily as needed (Knee pain)., Disp: 2 g, Rfl: 0   famotidine (PEPCID) 20 MG tablet, Take 1 tablet (20 mg total) by mouth daily., Disp: 30 tablet, Rfl: 0   fenofibrate (TRICOR) 145 MG tablet, Take 145 mg by mouth daily., Disp: , Rfl:    gabapentin (NEURONTIN) 100 MG capsule, Take 2 capsules (200 mg total) by mouth 2 (two) times daily as needed (pain)., Disp: 60 capsule, Rfl: 0   insulin glargine (LANTUS SOLOSTAR) 100 UNIT/ML Solostar Pen, Please provide 24 units every a.m. and 20 units every p.m., Disp: 15 mL, Rfl: 11   Insulin Pen Needle (PEN NEEDLES) 32G X 6 MM MISC, 1 application by Does not apply route 2 (two) times daily., Disp: 100 each, Rfl: 1   losartan (COZAAR) 50 MG tablet, Take 1 tablet (50 mg total) by mouth daily., Disp: 30 tablet, Rfl: 0   Multiple Vitamin (MULTIVITAMIN ADULT PO), Take 1 tablet by mouth daily., Disp: , Rfl:    Pirfenidone 267 MG TABS, TAKE 3 TABLETS (801 MG TOTAL) BY MOUTH THREE TIMES A DAY IN THE MORNING, AT NOON, AND AT BEDTIME., Disp: 270 tablet, Rfl: 2   rosuvastatin (CRESTOR) 20 MG tablet, Take 1 tablet (20 mg total) by mouth daily., Disp: 30 tablet, Rfl: 0   SYMBICORT 160-4.5 MCG/ACT inhaler, Inhale 2 puffs into the  lungs 2 (two) times daily., Disp: 1 each, Rfl: 5   Tiotropium Bromide Monohydrate (SPIRIVA RESPIMAT) 1.25 MCG/ACT AERS, Inhale 2 puffs into the lungs daily., Disp: 4 g, Rfl: 5   traMADol (ULTRAM) 50 MG tablet, Take 1 tablet (50 mg total) by mouth every 8 (eight) hours as needed for severe pain., Disp: 30 tablet, Rfl: 0   traZODone (DESYREL) 50 MG tablet, Take 1 tablet (50 mg total) by mouth at bedtime., Disp: 30 tablet, Rfl: 0   venlafaxine XR (EFFEXOR-XR) 75 MG 24 hr capsule, Take 1 capsule (75 mg total) by mouth daily with breakfast., Disp: 30 capsule, Rfl: 0      Objective:   Vitals:   04/28/21 1053  BP: 124/88  Pulse: 77  SpO2: 95%  Weight: 183 lb (83 kg)  Height: _0  (1.6 m)    Estimated body mass index is 32.42 kg/m as calculated from the following:   Height as of this encounter: _1  (1.6 m).   Weight as of this encounter: 183 lb (83 kg).  _2 @  Filed Weights   04/28/21 1053  Weight: 183 lb (83 kg)     Physical Exam  General: No distress. Sitting in exam room with cane Neuro: Alert and Oriented x 3. GCS 15. Speech normal Psych: Pleasant Resp:  Barrel Chest - no.  Wheeze - no, Crackles - yes at base, No overt respiratory distress CVS: Normal heart sounds. Murmurs - no Ext: Stigmata of Connective Tissue Disease - no HEENT: Normal upper airway. PEERL +. No post nasal drip        Assessment:       ICD-10-CM   1. IPF (idiopathic pulmonary fibrosis) (HCC)  J84.112 CBC w/Diff    Basic metabolic panel    Hepatic function panel    2. Pulmonary emphysema, unspecified emphysema type ([place])  J43.9     3. Encounter for therapeutic drug monitoring  Z51.81     4. Diarrhea due to drug  K52.1  5. Anemia, unspecified type  D64.9     6. Chronic kidney disease, unspecified CKD stage  N18.9     7. Flu vaccine need  Z23          Plan:     Patient Instructions     ICD-10-CM   1. IPF (idiopathic pulmonary fibrosis) ([place])  J84.112     2.  Pulmonary emphysema, unspecified emphysema type ([place])  J43.9     3. Encounter for therapeutic drug monitoring  Z51.81     4. Diarrhea due to drug  K52.1     5. Anemia, unspecified type  D64.9     6. Chronic kidney disease, unspecified CKD stage  N18.9     7. Flu vaccine need  Z23         Clinically pulmonary fibrosis is stable.   There is some amount of diarrhea because of pirfenidone -but overall tolerating pirfenidone well YOu also have chronic anemia [last documented June 2022] with hemoglobin 10 g% He also have chronic kidney disease creatinine 1.7 mg percent [last documented September 2022]   Plan -Check CBC, chemistry, liver function test today --Continue pirfenidone at 3 pills 3 times daily with food --Continue inhaler therapy Spiriva and Symbicort scheduled for your emphysema [take refills] --Do high-resolution CT chest supine and prone in 6 months (last oct 2020) -High-dose flu shot today   Follow-up -Return in 6 months to see Dr. Chase Caller but  high-resolution CT chest -30-minute slot       SIGNATURE    Dr. Brand Males, M.D., F.C.C.P,  Pulmonary and Critical Care Medicine Staff Physician, [facility] Director - Interstitial Lung Disease  Program  P[facility] [place] [facility] [place], [place], [address]  Pager: [phone], If no answer or between  15:00h - 7:00h: call [phone] Telephone: [phone]  11:55 AM 04/28/2021

## 2021-04-29 ENCOUNTER — Encounter: Payer: Self-pay | Admitting: Physical Therapy

## 2021-04-29 ENCOUNTER — Encounter: Payer: Self-pay | Admitting: Adult Health

## 2021-04-29 ENCOUNTER — Ambulatory Visit (INDEPENDENT_AMBULATORY_CARE_PROVIDER_SITE_OTHER): Payer: Medicare HMO | Admitting: Adult Health

## 2021-04-29 ENCOUNTER — Ambulatory Visit: Payer: Medicare HMO | Admitting: Physical Therapy

## 2021-04-29 ENCOUNTER — Ambulatory Visit: Payer: Medicare HMO | Admitting: Occupational Therapy

## 2021-04-29 ENCOUNTER — Other Ambulatory Visit: Payer: Self-pay

## 2021-04-29 VITALS — BP 160/88 | HR 65 | Ht 63.0 in | Wt 184.0 lb

## 2021-04-29 DIAGNOSIS — R41844 Frontal lobe and executive function deficit: Secondary | ICD-10-CM

## 2021-04-29 DIAGNOSIS — R2681 Unsteadiness on feet: Secondary | ICD-10-CM

## 2021-04-29 DIAGNOSIS — M6281 Muscle weakness (generalized): Secondary | ICD-10-CM

## 2021-04-29 DIAGNOSIS — R41840 Attention and concentration deficit: Secondary | ICD-10-CM

## 2021-04-29 DIAGNOSIS — I69354 Hemiplegia and hemiparesis following cerebral infarction affecting left non-dominant side: Secondary | ICD-10-CM

## 2021-04-29 DIAGNOSIS — I69398 Other sequelae of cerebral infarction: Secondary | ICD-10-CM | POA: Diagnosis not present

## 2021-04-29 DIAGNOSIS — I639 Cerebral infarction, unspecified: Secondary | ICD-10-CM

## 2021-04-29 DIAGNOSIS — R2689 Other abnormalities of gait and mobility: Secondary | ICD-10-CM

## 2021-04-29 DIAGNOSIS — I6389 Other cerebral infarction: Secondary | ICD-10-CM

## 2021-04-29 DIAGNOSIS — R278 Other lack of coordination: Secondary | ICD-10-CM

## 2021-04-29 DIAGNOSIS — R208 Other disturbances of skin sensation: Secondary | ICD-10-CM

## 2021-04-29 NOTE — Progress Notes (Signed)
[facility] [address] [place]. [place] [address] [phone]       [name]  [name] Date of Birth:  [birth year] Medical Record Number:  [id number]   Reason for Referral:  hospital stroke follow up    SUBJECTIVE:   CHIEF COMPLAINT:  Chief Complaint  Patient presents with   Follow-up    RM 2  alone  PT is well and doing ok.      HPI:   Update 04/29/2021 JM: Returns for 80-monthstroke follow-up unaccompanied  Continues to work with therapies making gradual improving of left sided weakness. Continues to use RW and at times her cane. Denies any recent falls.  Denies new stroke/TIA symptoms. Her son continues to live with her but able to maintain ADLs and majority of IADLs independently.   Compliant on aspirin and crestor - denies side effects Blood pressure today slightly increased - reports increased stressors today but monitors at home and typically stable Glucose levels stable at home -has not had recent A1c check Recent direct LDL 37  Plans to contact PCP to schedule follow-up visit Routinely followed by pulmonology for pulmonary fibrosis  No new concerns at this time      History provided for reference purposes only Update 12/30/2020 JM: Ms. SWedigreturns for hospital follow-up regarding recent stroke on 11/03/2020.  She presented with 2-3 day hx of headaches and worsening left-sided weakness.  Personally reviewed hospitalization pertinent progress notes, lab work and imaging summary provided.  Evaluated by Dr. SLeonie Manwith stroke work-up revealing right parietal occipital stroke likely due to small vessel disease although unable to rule out cardioembolic source given recent right carotid head and right anterior lateral thalamic infarcts.  MRA negative LVO.  Carotid Dopplers unremarkable.  Normal EF.  Loop recorder recommended but declined by patient inpatient.  Prior 14-day cardiac monitor completed which was negative for A.  fib.  On Plavix PTA and recommended DAPT for 3 weeks and aspirin alone.  HTN stable.  LDL 56 on Crestor 20 mg daily.  Controlled DM with A1c 5.2.  Other stroke risk factors include recent stroke (see below), advanced age, former tobacco use and CAD.  Discharged to CIR on 11/06/2020 -11/21/2020.  Since discharge, she has been making gradual improvements.  Currently working with home health PT for residual left-sided weakness and left sided numbness. She has since completed SLP. She has also been having headaches since her stroke - has been taking tramadol with benefit which was started by PMR.  Continues to ambulate with RW at all times -denies any recent falls. Her son is currently living with her. Able to maintain ADLs independently. Completed 3 weeks DAPT and remains on aspirin alone as well as Crestor without associated side effects.  Blood pressure today 128/79.  Had loop recorder placed by Dr. TTerri Skains7/6.   Sister jointed visit towards the end of visit - she is concerned regarding worsening depression and living situation as son does not assist with patients care.  She has not had recent visit with PCP.  She is currently on trazodone, venlafaxine, and buspirone  Initial visit 08/19/2020 JM: [name] being seen for hospital follow-up accompanied by her son.  [facility]  Reports residual left sided weakness with numbness/tingling and left lower facial numbness [facility] -reports continued improvement since discharge Currently ambulating with rolling walker but she was not using previously -denies any recent  falls Lives with her son who will assist as needed but she has been slowly becoming more independent with ADLs Denies new stroke/TIA symptoms  Completed 3 weeks DAPT and remains on Plavix alone -denies side effects Reports compliance on atorvastatin 40 mg daily -denies side effects Blood pressure today satisfactory 127/78 -monitors at home  and typically stable Glucose levels routinely monitored at home which have been fluctuating per patient report  She has not had follow-up with PCP or cardiology since discharge  No further concerns at this time  Stroke admission 06/25/2020: [name] is a 65 y.o. female with history of hypertension, hypercholesteremia, diabetes complicated by neuropathy, CAD, depression C/V prior suicide attempt, arthritis, 10-pack-year history of smoking quit in 2020, COPD/asthma, polysubstance abuse who presented to [facility] ED on 06/25/2020 with slurred speech on and off for past week and left leg weakness.    Personally reviewed hospitalization pertinent progress notes, lab work and imaging with summary provided.  Evaluated by Dr. Erlinda Hong with stroke work-up revealing right caudate head and right anterior lateral thalamic infarcts, likely secondary to small vessel disease however given 2 separate locations unable to rule out embolic source with 76-KGS cardiac event monitor outpatient.  Recommended DAPT for 3 weeks and Plavix alone.  LDL 86 -increase atorvastatin from 20 mg to 40 mg daily and ongoing use of fenofibrate and Vascepa.  Controlled DM with A1c 5.2.  Other stroke risk factors include advanced age, obesity, CAD, hx of cocaine use and former tobacco use.  Residual deficits of left hemiparesis and left hemisensory impairment.  Evaluated by therapies and discharged to CIR for ongoing therapy needs  Stroke: Lacunar infarcts in right caudate head and right anterior lateral thalamus, likely due to small vessel disease. However, given two separate location will need to rule out emoblic source CT head: Lacunar infarcts in the right caudate and right cerebellum, new from 2012, but age indeterminate. MRI : 1 cm acute infarction in the anterolateral thalamus on the right, possibly with some involvement of the posterior limb internal capsule. Acute infarction within the right caudate head.  MRA : Both internal carotid  arteries widely patent through the skull base and siphon regions. No stenosis.  Carotid Doppler: Velocities in the left and right ICA are consistent with a 1-39%  stenosis.  2D Echo: Left ventricular ejection fraction, by estimation, is 55 to 60%.  30 day cardiac event monitoring as outpt to rule out afib LDL 86 HgbA1c 5.2 RPR/HIV neg VTE prophylaxis -Lovenox 30 mg aspirin 81 mg daily prior to admission, now on aspirin 81 mg daily and Plavix DAPT for 3 weeks, then Plavix 75 mg alone.  Therapy recommendations: CIR Disposition: d/c to CIR on 06/30/2020       ROS:   14 system review of systems performed and negative with exception of those listed in HPI  PMH:  Past Medical History:  Diagnosis Date   Arthritis    especially in shoulders   Asthma    Depression    Diabetes mellitus    Embolic stroke ([place]) 01/21/314   GERD (gastroesophageal reflux disease)    Headache(784.0)    "mild"   Hypertension    Loop Biotronik loop implant 12/16/2020 12/16/2020   Mental disorder    depression   Neuropathy    feet    Pain    arthritis pain - takes tramadol as needed   Rectal polyp    very little bleeding with bowel movements- no pain   Stroke ([place])  11/03/2020   Suicide attempt ([place])     PSH:  Past Surgical History:  Procedure Laterality Date   ANAL RECTAL MANOMETRY N/A 07/13/2016   Procedure: ANO RECTAL MANOMETRY;  Surgeon: Leighton Ruff, MD;  Location: [facility] ENDOSCOPY;  Service: Endoscopy;  Laterality: N/A;   CESAREAN SECTION     EUS N/A 11/21/2012   Procedure: LOWER ENDOSCOPIC ULTRASOUND (EUS);  Surgeon: Arta Silence, MD;  Location: [facility];  Service: Endoscopy;  Laterality: N/A;   FLEXIBLE SIGMOIDOSCOPY N/A 11/21/2012   Procedure: FLEXIBLE SIGMOIDOSCOPY;  Surgeon: Arta Silence, MD;  Location: [facility] ENDOSCOPY;  Service: Endoscopy;  Laterality: N/A;   FLEXIBLE SIGMOIDOSCOPY N/A 01/28/2013   Procedure: FLEXIBLE SIGMOIDOSCOPY;  Surgeon: Leighton Ruff, MD;  Location: [facility] ENDOSCOPY;   Service: Endoscopy;  Laterality: N/A;   LAPAROSCOPIC LOW ANTERIOR RESECTION N/A 01/29/2013   Procedure: LAPAROSCOPIC LOW ANTERIOR RESECTION, Rigid Proctoscopy;  Surgeon: Leighton Ruff, MD;  Location: [facility] ORS;  Service: General;  Laterality: N/A;   LAPAROSCOPIC SIGMOID COLECTOMY N/A 11/14/2012   Procedure: DIAGNOSTIC LAPAROSCOPY AND SIGMOIDMOIDOSCOPY ;  Surgeon: Rolm Bookbinder, MD;  Location: [facility]S;  Service: General;  Laterality: N/A;   RECTAL ULTRASOUND N/A 07/13/2016   Procedure: RECTAL ULTRASOUND;  Surgeon: Leighton Ruff, MD;  Location: [facility] ENDOS[facility];  Service: Endoscopy;  Laterality: N/A;   TONSILLECTOMY     TONSILLECTOMY AND ADENOIDECTOMY      Social History:  Social History   Socioeconomic History   Marital status: Single    Spouse name: Not on file   Number of children: 1   Years of education: Not on file   Highest education level: Not on file  Occupational History   Not on file  Tobacco Use   Smoking status: Former    Packs/day: 0.50    Years: 20.00    Pack years: 10.00    Types: Cigarettes    Quit date: 01/05/2018    Years since quitting: 3.3   Smokeless tobacco: Never   Tobacco comments:    Unknown   Vaping Use   Vaping Use: Never used  Substance and Sexual Activity   Alcohol use: Not Currently   Drug use: Not Currently    Types: "Crack" cocaine    Comment: Unknown    Sexual activity: Never    Birth control/protection: None  Other Topics Concern   Not on file  Social History Narrative   12/30/20 lives with others   Social Determinants of Health   Financial Resource Strain: Not on file  Food Insecurity: Not on file  Transportation Needs: Not on file  Physical Activity: Not on file  Stress: Not on file  Social Connections: Not on file  Intimate Partner Violence: Not on file    Family History:  Family History  Problem Relation Age of Onset   Hypertension Father    Stroke Father    Diabetes Father    Heart disease Mother    Hypertension Mother     Hyperlipidemia Mother    Hypertension Sister    Hypertension Brother    Hypertension Sister    Hypertension Brother    Cancer Other 1       colon cancer    Cancer Maternal Aunt        cancer, unknown type    Cancer Maternal Uncle        bone cancer    Cancer Paternal Aunt        lung cancer   Cancer Paternal Uncle        lung  cancer   Cancer Maternal Uncle        colon cancer and prostate cancer    Cancer Maternal Uncle        prostate cancer     Medications:   Current Outpatient Medications on File Prior to Visit  Medication Sig Dispense Refill   acetaminophen (TYLENOL) 325 MG tablet Take 2 tablets (650 mg total) by mouth every 4 (four) hours as needed for mild pain (or temp > 37.5 C (99.5 F)).     albuterol (VENTOLIN HFA) 108 (90 Base) MCG/ACT inhaler Inhale 2 puffs into the lungs every 4 (four) hours as needed for wheezing. 1 g 5   amLODipine (NORVASC) 10 MG tablet Take 1 tablet (10 mg total) by mouth daily. 30 tablet 2   aspirin EC 81 MG EC tablet Take 1 tablet (81 mg total) by mouth daily. Swallow whole. 30 tablet 11   busPIRone (BUSPAR) 10 MG tablet Take 1 tablet (10 mg total) by mouth 2 (two) times daily. 60 tablet 0   diclofenac Sodium (VOLTAREN) 1 % GEL Apply 2 g topically daily as needed (Knee pain). 2 g 0   famotidine (PEPCID) 20 MG tablet Take 1 tablet (20 mg total) by mouth daily. 30 tablet 0   fenofibrate (TRICOR) 145 MG tablet Take 145 mg by mouth daily.     gabapentin (NEURONTIN) 100 MG capsule Take 2 capsules (200 mg total) by mouth 2 (two) times daily as needed (pain). 60 capsule 0   insulin glargine (LANTUS SOLOSTAR) 100 UNIT/ML Solostar Pen Please provide 24 units every a.m. and 20 units every p.m. 15 mL 11   Insulin Pen Needle (PEN NEEDLES) 32G X 6 MM MISC 1 application by Does not apply route 2 (two) times daily. 100 each 1   losartan (COZAAR) 50 MG tablet Take 1 tablet (50 mg total) by mouth daily. 30 tablet 0   Multiple Vitamin (MULTIVITAMIN ADULT PO) Take  1 tablet by mouth daily.     Pirfenidone 267 MG TABS TAKE 3 TABLETS (801 MG TOTAL) BY MOUTH THREE TIMES A DAY IN THE MORNING, AT NOON, AND AT BEDTIME. 270 tablet 2   rosuvastatin (CRESTOR) 20 MG tablet Take 1 tablet (20 mg total) by mouth daily. 30 tablet 0   SYMBICORT 160-4.5 MCG/ACT inhaler Inhale 2 puffs into the lungs 2 (two) times daily. 1 each 5   Tiotropium Bromide Monohydrate (SPIRIVA RESPIMAT) 1.25 MCG/ACT AERS Inhale 2 puffs into the lungs daily. 4 g 5   traMADol (ULTRAM) 50 MG tablet Take 1 tablet (50 mg total) by mouth every 8 (eight) hours as needed for severe pain. 30 tablet 0   traZODone (DESYREL) 50 MG tablet Take 1 tablet (50 mg total) by mouth at bedtime. 30 tablet 0   venlafaxine XR (EFFEXOR-XR) 75 MG 24 hr capsule Take 1 capsule (75 mg total) by mouth daily with breakfast. 30 capsule 0   No current facility-administered medications on file prior to visit.    Allergies:   Allergies  Allergen Reactions   Penicillins Hives, Itching and Swelling    Tongue swells up Has patient had a PCN reaction causing immediate rash, facial/tongue/throat swelling, SOB or lightheadedness with hypotension: Yes Has patient had a PCN reaction causing severe rash involving mucus membranes or skin necrosis: Yes Has patient had a PCN reaction that required hospitalization Yes Has patient had a PCN reaction occurring within the last 10 years: No If all of the above answers are "NO", then may proceed with  Cephalosporin use.       OBJECTIVE:  Physical Exam  Vitals:   04/29/21 1453  BP: (!) 160/88  Pulse: 65  Weight: 184 lb (83.5 kg)  Height: _0  (1.6 m)    Body mass index is 32.59 kg/m. No results found.  General: well developed, well nourished,  very pleasant elderly African-American female, seated, in no evident distress Head: head normocephalic and atraumatic.   Neck: supple with no carotid or supraclavicular bruits Cardiovascular: regular rate and rhythm, no  murmurs Musculoskeletal: no deformity Skin:  no rash/petichiae Vascular:  Normal pulses all extremities   Neurologic Exam Mental Status: Awake and fully alert.   Fluent speech and language.  Oriented to place and time. Recent and remote memory intact. Attention span, concentration and fund of knowledge appropriate during visit. Mood and affect appropriate and cooperative with history taking and exam.  Cranial Nerves: Pupils equal, briskly reactive to light. Extraocular movements full without nystagmus. Visual fields full to confrontation. Hearing intact.  Decreased light touch left lower facial sensation.  Mild left lower facial weakness.  Tongue and palate moves normally and symmetrically.  Motor: Normal bulk and tone and normal strength on the right side. Mild left hemiparesis 4+/5 upper extremity and 4+/5 hip flexor weakness. Left shoulder pain likely contributing to some LUE weakness Sensory.:  Intact to light touch, vibratory and pinprick sensation throughout Coordination: Rapid alternating movements normal in all extremities except slightly decreased left hand. Finger-to-nose and heel-to-shin performed accurately bilaterally.   Gait and Station: Arises from chair without difficulty. Stance is normal. Gait demonstrates normal stride length and step height with use of rolling walker.  Tandem walking heel toe not attempted Reflexes: 1+ and symmetric. Toes downgoing.         ASSESSMENT: Erica [name] is a 65 y.o. year old female with recurrent right MCA strokes on 11/03/2020 and 06/25/2020 likely in setting of small vessel disease although unable to rule out cardioembolic source with residual left hemiparesis and sensory impairment.  Loop recorder placed 12/2020 by Dr. Terri Skains [facility] cardiology) to evaluate for possible A. fib as prior cardiac monitor negative.  Vascular risk factors include HTN, HLD, DM, CAD, history of tobacco use and history of polysubstance abuse.      PLAN:  Ischemic  stroke:  Residual deficit: Mild left hemiparesis.  Continue working with PT/OT for hopeful ongoing recovery Continue aspirin 63m daily and Crestor and fenofibrate for secondary stroke prevention.  S/p ILR 12/16/2020 - [facility]. TTerri Skainsdiscussed secondary stroke prevention measures and importance of close PCP follow up for aggressive stroke risk factor management  HTN: BP goal <130/90.  Stable on current regimen per PCP/cardiology HLD: LDL goal <70. Recent direct LDL 37 on Crestor 20 mg daily and fenofibrate managed by PCP/cardiology DMII: A1c goal<7.0. prior A1c 6.3 on Lantus per PCP - needs repeat A1c - plans on contacting PCP to schedule f/u visit    Follow up in 6 months or call earlier if needed    CC:  PCP: ANolene Ebbs MD    I spent 31 minutes of face-to-face and non-face-to-face time with patient.  This included previsit chart review, lab review, study review, order entry, electronic health record documentation, patient education and discussion regarding recent recurrent stroke including etiology, secondary stroke prevention measures and importance of managing stroke risk factors, residual deficits and possible further recovery and answered all other questions to patient satisfaction  JFrann Rider AGNP-BC  [facility] [address]  [place], [place] [address]  Phone [phone] Fax [phone] Note: This document was prepared with digital dictation and possible smart phrase technology. Any transcriptional errors that result from this process are unintentional.

## 2021-04-29 NOTE — Patient Instructions (Addendum)
Continue aspirin 81 mg daily  and Crestor  for secondary stroke prevention  Continue to follow up with PCP regarding cholesterol, blood pressure and diabetes management  Maintain strict control of hypertension with blood pressure goal below 130/90, diabetes with hemoglobin A1c goal below 7.0% and cholesterol with LDL cholesterol (bad cholesterol) goal below 70 mg/dL.   Continue working with therapies for hopeful ongoing recovery - keep up the good work!!      Followup in the future with me in 6 months or call earlier if needed       Thank you for coming to see [place] at [facility]. I hope we have been able to provide you high quality care today.  You may receive a patient satisfaction survey over the next few weeks. We would appreciate your feedback and comments so that we may continue to improve ourselves and the health of our patients.

## 2021-04-29 NOTE — Therapy (Signed)
[facility] [address] [place], [place], [address] Phone: [phone]   Fax:  [phone]  Occupational Therapy Treatment  Patient Details  Name: Erica [name] MRN: [id number] Date of Birth: [birth year] Referring Provider (OT): Dr. Letta Pate   Encounter Date: 04/29/2021   OT End of Session - 04/29/21 1228     Visit Number 15    Number of Visits 25    Date for OT Re-Evaluation 05/19/21    Authorization Type Medicare/ Medicaid    Authorization - Visit Number 15    Progress Note Due on Visit 4    OT Start Time 1103    OT Stop Time 1145    OT Time Calculation (min) 42 min    Activity Tolerance Patient tolerated treatment well    Behavior During Therapy [facility] for tasks assessed/performed             Past Medical History:  Diagnosis Date   Arthritis    especially in shoulders   Asthma    Depression    Diabetes mellitus    Embolic stroke ([place]) 0/99/8338   GERD (gastroesophageal reflux disease)    Headache(784.0)    "mild"   Hypertension    Loop Biotronik loop implant 12/16/2020 12/16/2020   Mental disorder    depression   Neuropathy    feet    Pain    arthritis pain - takes tramadol as needed   Rectal polyp    very little bleeding with bowel movements- no pain   Stroke ([place]) 11/03/2020   Suicide attempt [facility])     Past Surgical History:  Procedure Laterality Date   ANAL RECTAL MANOMETRY N/A 07/13/2016   Procedure: ANO RECTAL MANOMETRY;  Surgeon: Leighton Ruff, MD;  [facility];  Service: Endoscopy;  Laterality: N/A;   CESAREAN SECTION     EUS N/A 11/21/2012   Procedure: LOWER ENDOSCOPIC ULTRASOUND (EUS);  Surgeon: Arta Silence, MD;  Location: [facility];  Service: Endoscopy;  Laterality: N/A;   FLEXIBLE SIGMOIDOSCOPY N/A 11/21/2012   Procedure: FLEXIBLE SIGMOIDOSCOPY;  Surgeon: Arta Silence, MD;  [facility];  Service: Endoscopy;  Laterality: N/A;   FLEXIBLE SIGMOIDOSCOPY N/A  01/28/2013   Procedure: FLEXIBLE SIGMOIDOSCOPY;  Surgeon: Leighton Ruff, MD;  [facility];  Service: Endoscopy;  Laterality: N/A;   LAPAROSCOPIC LOW ANTERIOR RESECTION N/A 01/29/2013   Procedure: LAPAROSCOPIC LOW ANTERIOR RESECTION, Rigid Proctoscopy;  Surgeon: Leighton Ruff, MD;  [facility];  Service: General;  Laterality: N/A;   LAPAROSCOPIC SIGMOID COLECTOMY N/A 11/14/2012   Procedure: DIAGNOSTIC LAPAROSCOPY AND SIGMOIDMOIDOSCOPY ;  Surgeon: Rolm Bookbinder, MD;  [facility];  Service: General;  Laterality: N/A;   RECTAL ULTRASOUND N/A 07/13/2016   Procedure: RECTAL ULTRASOUND;  Surgeon: Leighton Ruff, MD;  [facility];  Service: Endoscopy;  Laterality: N/A;   TONSILLECTOMY     TONSILLECTOMY AND ADENOIDECTOMY      There were no vitals filed for this visit.   Subjective Assessment - 04/29/21 1137     Subjective  Pt reports transportation issues    Pertinent History Referred by [name] 01/21/21 , post-stroke  History of right parieto-occipital infarct. Most recent stroke 11/03/20. PMH: arthritis, asthma, depression, HTN, neuropathy, suicide attempt, DM    Patient Stated Goals do more stuff around the house    Currently in Pain? Yes    Pain Score 3     Pain Location Shoulder    Pain Orientation Left    Pain Descriptors /  Indicators Aching    Pain Type Chronic pain    Pain Onset 1 to 4 weeks ago    Pain Frequency Constant    Aggravating Factors  malpositioning    Pain Relieving Factors rest               Treatment: simple cooking task to make a grilled cheese. Pt retrieved all items with min v.c for walker safety and getting closer to items when reaching.  Pt was safe with the cooking task, she used walker tray to carry items and she turned off the stove without prompting  Activities for fine motor coordination and grip strength: grooved pegs with LUE, occasional min difficulty, removing with in hand manipulation. Gripper set at level 2  sustained grip, min difficulty/ drops                      OT Short Term Goals - 03/30/21 1127       OT SHORT TERM GOAL #1   Title I with inital  HEP- 03/24/21    Time 4    Period Weeks    Status Achieved    Target Date 03/24/21      OT SHORT TERM GOAL #2   Title I with sensory precautions    Time 4    Period Weeks    Status Achieved   pt  verbalized understanding of precautions 03/23/21     OT SHORT TERM GOAL #3   Title Pt will perform basic home mangement with supervision    Time 4    Period Weeks    Status Achieved   pt reports doing laundry consistently from w/c level in apt 03/23/21     OT SHORT TERM GOAL #4   Title Pt will perform basic cooking with supervision demonstrating good safety awareness.    Time 4    Period Weeks    Status On-going   03/17/21, supervision and min v.c for walker safety     OT SHORT TERM GOAL #5   Title Pt will perform all basic ADLS modified independently.    Time 4    Period Weeks    Status Achieved   goes to beauty shop to get hair shampooed 03/10/21     OT SHORT TERM GOAL #6   Title Pt will retrieve a lightweight object at 115 shoulder flexion with LUE without dropping    Baseline 105*    Time 4    Period Weeks    Status Achieved   min v/c for decreasing abduction 03/23/21              OT Long Term Goals - 04/13/21 1238       OT LONG TERM GOAL #1   Title I with updated HEP. 05/19/21    Time 12    Period Weeks    Status On-going      OT LONG TERM GOAL #2   Title Pt will demonstrate improved fine motor coordination for ADLs as evidenced  by decreasing LUE 9 hole peg test to 28 secs without drops.    Time 12    Period Weeks    Status On-going   28.87, improved but not yet met     OT LONG TERM GOAL #3   Title Pt will demonstrate ability to retrieve a 2 lbs object from overhead shelf without drops with LUE.    Time 12    Period Weeks    Status On-going  OT LONG TERM GOAL #4   Title Pt will  perform all basic ADLs modified Indpendently.    Time 12    Period Weeks    Status Achieved      OT LONG TERM GOAL #5   Title Pt will perfom basic home management/ cooking mod I with good safety awareness.    Time 12    Period Weeks    Status Achieved      OT LONG TERM GOAL #6   Title I with memory compensation strategies.    Time 12    Period Weeks    Status On-going                   Plan - 04/29/21 1222     Clinical Impression Statement Pt continues to make progress .She cooked a grilled cheese sandwich with only occasional min v.c for positioning/ walker safety. Pt was safe with the cooking task but she reaches for things that are too far away    OT Occupational Profile and History Detailed Assessment- Review of Records and additional review of physical, cognitive, psychosocial history related to current functional performance    Occupational performance deficits (Please refer to evaluation for details): ADL's;IADL's;Leisure    Body Structure / Function / Physical Skills ADL;Endurance;UE functional use;Balance;Flexibility;FMC;Gait;ROM;GMC;Coordination;Decreased knowledge of precautions;Sensation;IADL;Decreased knowledge of use of DME;Dexterity;Mobility    Cognitive Skills Attention;Memory    Rehab Potential Good    Clinical Decision Making Limited treatment options, no task modification necessary    Comorbidities Affecting Occupational Performance: May have comorbidities impacting occupational performance    Modification or Assistance to Complete Evaluation  No modification of tasks or assist necessary to complete eval    OT Frequency 2x / week   plus eval, will likely d/c after 8 weeks dependent on progress   OT Duration 12 weeks    OT Treatment/Interventions Self-care/ADL training;Ultrasound;Patient/family education;DME and/or AE instruction;Gait Training;Passive range of motion;Balance training;Fluidtherapy;Cryotherapy;Electrical Stimulation;Wellsite geologist;Therapeutic activities;Manual Therapy;Therapeutic exercise;Cognitive remediation/compensation;Neuromuscular education    Plan continue working towards goals, walker safety, home management    Consulted and Agree with Plan of Care Patient             Patient will benefit from skilled therapeutic intervention in order to improve the following deficits and impairments:   Body Structure / Function / Physical Skills: ADL, Endurance, UE functional use, Balance, Flexibility, FMC, Gait, ROM, GMC, Coordination, Decreased knowledge of precautions, Sensation, IADL, Decreased knowledge of use of DME, Dexterity, Mobility Cognitive Skills: Attention, Memory     Visit Diagnosis: Muscle weakness (generalized)  Unsteadiness on feet  Other lack of coordination  Attention and concentration deficit  Frontal lobe and executive function deficit  Other disturbances of skin sensation  Parietal lobe infarction [facility])    Problem List Patient Active Problem List   Diagnosis Date Noted   Encounter for loop recorder check 01/02/2021   Loop Biotronik loop implant 12/16/2020 12/16/2020   Leukopenia    Controlled type 2 diabetes mellitus with hyperglycemia, with long-term current use of insulin (HCC)    Vascular headache    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[place]) 63/06/6008   Embolic stroke ([place]) 93/23/5573   Sinus bradycardia on ECG 11/03/2020   Benign essential HTN    Pancytopenia (HCC)    Stage 3b chronic kidney disease ([place])    Anemia    Labile blood glucose    Labile blood pressure    Diabetic peripheral neuropathy (HCC)    Essential hypertension    AKI (  acute kidney injury) ([place])    Ataxia, late effect of cerebrovascular disease    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[place]) 06/30/2020   Acute CVA (cerebrovascular accident) ([place]) 06/26/2020   CVA (cerebral vascular accident) ([place]) 06/25/2020   BMI 32.0-32.9,adult 06/03/2020   Left upper lobe pneumonia 03/30/2020   Chest pain 10/01/2019   MDD  (major depressive disorder), recurrent severe, without psychosis ([place]) 05/09/2019   Suicide attempt ([place]) 05/09/2019   Cocaine abuse ([place]) 05/09/2019   Interstitial lung disease ([place]) 06/21/2016   COPD (chronic obstructive pulmonary disease) ([place]) 06/21/2016   Cough 06/21/2016   GERD (gastroesophageal reflux disease) 06/21/2016   Hypersomnia 06/21/2016   Tobacco user 06/21/2016   Rectal cancer ([place]) 01/30/2013    Randi Poullard, OT/L 04/29/2021, 12:28 PM  [facility] [address] [place], [place], [address] Phone: [phone]   Fax:  [phone]  Name: SUMAYYAH CUSTODIO MRN: [id number] Date of Birth: 11-15-1955

## 2021-04-29 NOTE — Therapy (Signed)
[facility] [address] [place], [place], [address] Phone: [phone]   Fax:  [phone]  Physical Therapy Treatment  Patient Details  Name: Erica [name] MRN: [id number] Date of Birth: 1956/04/28 Referring Provider (PT): [name]   Encounter Date: 04/29/2021   PT End of Session - 04/29/21 1252     Visit Number 15    Number of Visits 29    Date for PT Re-Evaluation 06/11/21    Authorization Type [employer]    Authorization Time Period 17 visits 9/16-11/4/22; 16 visists from 11/8-12/30/22    Authorization - Visit Number 3    Authorization - Number of Visits 16    Progress Note Due on Visit 20    PT Start Time 1147    PT Stop Time 1225    PT Time Calculation (min) 38 min    Equipment Utilized During Treatment Gait belt    Activity Tolerance Patient tolerated treatment well    Behavior During Therapy [facility] for tasks assessed/performed             Past Medical History:  Diagnosis Date   Arthritis    especially in shoulders   Asthma    Depression    Diabetes mellitus    Embolic stroke ([place]) 3/76/2831   GERD (gastroesophageal reflux disease)    Headache(784.0)    "mild"   Hypertension    Loop Biotronik loop implant 12/16/2020 12/16/2020   Mental disorder    depression   Neuropathy    feet    Pain    arthritis pain - takes tramadol as needed   Rectal polyp    very little bleeding with bowel movements- no pain   Stroke ([place]) 11/03/2020   Suicide attempt [facility])     Past Surgical History:  Procedure Laterality Date   ANAL RECTAL MANOMETRY N/A 07/13/2016   Procedure: ANO RECTAL MANOMETRY;  Surgeon: Leighton Ruff, MD;  [facility];  Service: Endoscopy;  Laterality: N/A;   CESAREAN SECTION     EUS N/A 11/21/2012   Procedure: LOWER ENDOSCOPIC ULTRASOUND (EUS);  Surgeon: Arta Silence, MD;  [facility];  Service: Endoscopy;  Laterality: N/A;   FLEXIBLE SIGMOIDOSCOPY N/A  11/21/2012   Procedure: FLEXIBLE SIGMOIDOSCOPY;  Surgeon: Arta Silence, MD;  [facility];  Service: Endoscopy;  Laterality: N/A;   FLEXIBLE SIGMOIDOSCOPY N/A 01/28/2013   Procedure: FLEXIBLE SIGMOIDOSCOPY;  Surgeon: Leighton Ruff, MD;  [facility];  Service: Endoscopy;  Laterality: N/A;   LAPAROSCOPIC LOW ANTERIOR RESECTION N/A 01/29/2013   Procedure: LAPAROSCOPIC LOW ANTERIOR RESECTION, Rigid Proctoscopy;  Surgeon: Leighton Ruff, MD;  [facility];  Service: General;  Laterality: N/A;   LAPAROSCOPIC SIGMOID COLECTOMY N/A 11/14/2012   Procedure: DIAGNOSTIC LAPAROSCOPY AND SIGMOIDMOIDOSCOPY ;  Surgeon: Rolm Bookbinder, MD;  [facility];  Service: General;  Laterality: N/A;   RECTAL ULTRASOUND N/A 07/13/2016   Procedure: RECTAL ULTRASOUND;  Surgeon: Leighton Ruff, MD;  [facility];  Service: Endoscopy;  Laterality: N/A;   TONSILLECTOMY     TONSILLECTOMY AND ADENOIDECTOMY      There were no vitals filed for this visit.   Subjective Assessment - 04/29/21 1150     Subjective No new complaints or falls. 'Normal shoulder aching today." Pt reports son was planning on ordering Foot Up brace today.    Pertinent History arthritis, asthma, depression, HTN, neuropathy, suicide attempt, DM, CVA    Patient Stated Goals Pt would like to be able to walk and  use her hands better.    Currently in Pain? Yes    Pain Score 3     Pain Location Shoulder    Pain Orientation Left    Pain Descriptors / Indicators Aching;Sore    Pain Type Chronic pain    Pain Onset 1 to 4 weeks ago                  [facility] Adult PT Treatment/Exercise - 04/29/21 1153       Transfers   Transfers Sit to Stand;Stand to Sit    Sit to Stand 5: Supervision    Stand to Sit 5: Supervision      Ambulation/Gait   Ambulation/Gait Yes    Ambulation/Gait Assistance 5: Supervision    Ambulation Distance (Feet) 300 Feet   2 x around track and throughout session with straight cane/quad tip.  Pt ambulated in/out of session with RW.   Assistive device Rolling walker;Straight cane;Other (Comment)   Straight cane with quad tip   Gait Pattern Step-through pattern;Decreased stance time - left;Decreased hip/knee flexion - left;Decreased dorsiflexion - left;Decreased arm swing - left    Ambulation Surface Level;Indoor      Knee/Hip Exercises: Standing   Lateral Step Up Both;1 set;10 reps;Hand Hold: 1;Step Height: 4"    Lateral Step Up Limitations In // bars with single UE support, pt performed 10 reps lateral step ups each side. Pt required cues for technique and min guard.    Forward Step Up Both;1 set;Hand Hold: 1;10 reps;Step Height: 4"    Forward Step Up Limitations In // bars with single UE support, pt performed 10 reps forward step ups with each LE. Pt required cues for technique and min guard.    Step Down Both;1 set;10 reps;Hand Hold: 1;Step Height: 4"    Step Down Limitations In // bars with single UE assist pt performed step downs for 10 reps each side. Pt required cues for technique and min guard.                 Balance Exercises - 04/29/21 1212       Balance Exercises: Standing   Rockerboard Anterior/posterior;Lateral;EO;EC    Rockerboard Limitations on balance board in both directions with no UE support, occasional touch to bars for balance: rocking the board with EO, ~10 reps each direction. Then progressing to holding the board steady for EC 30 sec's x 3 reps. min guard to min assist for balance with cues for posture and weight shifting to assist with balance as well.                  PT Short Term Goals - 04/20/21 1049       PT SHORT TERM GOAL #1   Title Pt will increase Berg from 49/56 to >53/56 for improved balance and decreased fall risk.    Baseline 04/20/21 49/56    Time 4    Period Weeks    Status New    Target Date 05/18/21      PT SHORT TERM GOAL #2   Title Pt will ambulate up/down curb and ramp with cane mod I for improved community  access.    Time 4    Period Weeks    Status New    Target Date 05/18/21      PT SHORT TERM GOAL #3   Title Pt will increase gait speed to >0.8ms with cane for improved community mobility.    Baseline 0.663m    Time 4  Period Weeks    Status New    Target Date 05/18/21               PT Long Term Goals - 04/20/21 1052       PT LONG TERM GOAL #1   Title Pt will be independent with progressive HEP for strengthening and balance to continue gains on own. (LTGs due 06/11/21)    Baseline 04/20/21 Pt has been instructed in HEP she is working on . PT continues to add and update.    Time 8    Period Weeks    Status On-going    Target Date 06/11/21      PT LONG TERM GOAL #2   Title Pt will increase DGI from 14/24 to >19/24 for improved balance and gait safety.    Baseline 04/20/21 14/24    Time 8    Period Weeks    Status New    Target Date 06/11/21      PT LONG TERM GOAL #3   Title Pt will ambulate up/down 4 steps with cane in reciprocal pattern for improved community access and functional strength.    Time 8    Period Weeks    Status New    Target Date 06/11/21      PT LONG TERM GOAL #4   Title Pt will ambulate >800' on varied surfaces with cane mod I for improved short community distances.    Baseline 11/1- pt ambulated 550' on varied surface w/ quad tip cane supervision.    Time 8    Period Weeks    Status Revised    Target Date 06/11/21      PT LONG TERM GOAL #5   Title Pt will increase  6 min walk distance to >800' for improved activity tolerance and function.    Baseline 04/20/21 690'    Time 8    Period Weeks    Status New    Target Date 06/11/21      PT LONG TERM GOAL #6   Title FOTO will be completed at d/c to get final measure on self perceived disability.    Baseline 02/16/21 50. 04/20/21 62    Time 8    Period Weeks    Status Revised    Target Date 06/11/21                   Plan - 04/29/21 1254     Clinical Impression Statement  Today's skilled session continued to focus on gait with straight cane, LE strengthening, and balance exercises. No other issues noted or reported this session. The pt could continue to benefit from continued skilled PT to address functional deficits.    Personal Factors and Comorbidities Comorbidity 3+    Comorbidities arthritis, asthma, depression, HTN, neuropathy, suicide attempt, DM    Examination-Activity Limitations Locomotion Level;Transfers;Stand;Stairs;Squat    Examination-Participation Restrictions Community Activity;Driving;Yard Work;Cleaning    Stability/Clinical Decision Making Evolving/Moderate complexity    Rehab Potential Good    PT Frequency 2x / week    PT Duration 8 weeks    PT Treatment/Interventions ADLs/Self Care Home Management;DME Instruction;Neuromuscular re-education;Manual techniques;Balance training;Therapeutic exercise;Therapeutic activities;Functional mobility training;Stair training;Gait training;Vestibular;Passive range of motion;Orthotic Fit/Training;Patient/family education    PT Next Visit Plan Did Foot Up brace come in?? continue LLE strengthening, static and dynamic balance training. Gait/stair training with cane with quad tip. Curub/ramp training. Step-ups with LLE working on control.    Consulted and Agree with Plan of Care Patient  Patient will benefit from skilled therapeutic intervention in order to improve the following deficits and impairments:  Abnormal gait, Decreased balance, Decreased activity tolerance, Decreased mobility, Decreased strength, Decreased knowledge of use of DME, Decreased endurance, Impaired sensation  Visit Diagnosis: Muscle weakness (generalized)  Unsteadiness on feet  Other abnormalities of gait and mobility     Problem List Patient Active Problem List   Diagnosis Date Noted   Encounter for loop recorder check 01/02/2021   Loop Biotronik loop implant 12/16/2020 12/16/2020   Leukopenia    Controlled type 2  diabetes mellitus with hyperglycemia, with long-term current use of insulin (HCC)    Vascular headache    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[place]) 66/11/3014   Embolic stroke ([place]) 06/21/3233   Sinus bradycardia on ECG 11/03/2020   Benign essential HTN    Pancytopenia ([place])    Stage 3b chronic kidney disease ([place])    Anemia    Labile blood glucose    Labile blood pressure    Diabetic peripheral neuropathy ([place])    Essential hypertension    AKI (acute kidney injury) ([place])    Ataxia, late effect of cerebrovascular disease    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[place]) 06/30/2020   Acute CVA (cerebrovascular accident) ([place]) 06/26/2020   CVA (cerebral vascular accident) ([place]) 06/25/2020   BMI 32.0-32.9,adult 06/03/2020   Left upper lobe pneumonia 03/30/2020   Chest pain 10/01/2019   MDD (major depressive disorder), recurrent severe, without psychosis ([place]) 05/09/2019   Suicide attempt ([place]) 05/09/2019   Cocaine abuse ([place]) 05/09/2019   Interstitial lung disease ([employer]) 06/21/2016   COPD (chronic obstructive pulmonary disease) ([place]) 06/21/2016   Cough 06/21/2016   GERD (gastroesophageal reflux disease) 06/21/2016   Hypersomnia 06/21/2016   Tobacco user 06/21/2016   Rectal cancer ([place]) 01/30/2013    Rondel Baton, [place] 04/29/2021, 4:40 PM  [facility] [address] [place], [place], [address] Phone: [phone]   Fax:  [phone]  Name: EDITA WEYENBERG MRN: [id number] Date of Birth: January 19, 1956

## 2021-05-04 ENCOUNTER — Ambulatory Visit: Payer: Medicare HMO

## 2021-05-04 ENCOUNTER — Ambulatory Visit: Payer: Medicare HMO | Admitting: Occupational Therapy

## 2021-05-04 ENCOUNTER — Encounter: Payer: Self-pay | Admitting: Occupational Therapy

## 2021-05-04 ENCOUNTER — Other Ambulatory Visit: Payer: Self-pay

## 2021-05-04 VITALS — BP 140/90 | HR 76

## 2021-05-04 DIAGNOSIS — R2689 Other abnormalities of gait and mobility: Secondary | ICD-10-CM

## 2021-05-04 DIAGNOSIS — R208 Other disturbances of skin sensation: Secondary | ICD-10-CM

## 2021-05-04 DIAGNOSIS — M6281 Muscle weakness (generalized): Secondary | ICD-10-CM

## 2021-05-04 DIAGNOSIS — I69398 Other sequelae of cerebral infarction: Secondary | ICD-10-CM | POA: Diagnosis not present

## 2021-05-04 DIAGNOSIS — R278 Other lack of coordination: Secondary | ICD-10-CM

## 2021-05-04 DIAGNOSIS — R2681 Unsteadiness on feet: Secondary | ICD-10-CM

## 2021-05-04 DIAGNOSIS — R41844 Frontal lobe and executive function deficit: Secondary | ICD-10-CM

## 2021-05-04 DIAGNOSIS — R41840 Attention and concentration deficit: Secondary | ICD-10-CM

## 2021-05-04 NOTE — Therapy (Signed)
[facility] [address] [place], [place], [address] Phone: [phone]   Fax:  [phone]  Physical Therapy Treatment  Patient Details  Name: Erica [name] MRN: [id number] Date of Birth: February 23, 1956 Referring Provider (PT): [name]   Encounter Date: 05/04/2021   PT End of Session - 05/04/21 1449     Visit Number 16    Number of Visits 29    Date for PT Re-Evaluation 06/11/21    Authorization Type [employer]    Authorization Time Period 17 visits 9/16-11/4/22; 16 visists from 11/8-12/30/22    Authorization - Visit Number 3    Authorization - Number of Visits 16    Progress Note Due on Visit 32    PT Start Time 1400    PT Stop Time 1438    PT Time Calculation (min) 38 min    Equipment Utilized During Treatment Gait belt    Activity Tolerance Patient tolerated treatment well    Behavior During Therapy [facility] for tasks assessed/performed             Past Medical History:  Diagnosis Date   Arthritis    especially in shoulders   Asthma    Depression    Diabetes mellitus    Embolic stroke ([place]) 6/96/2952   GERD (gastroesophageal reflux disease)    Headache(784.0)    "mild"   Hypertension    Loop Biotronik loop implant 12/16/2020 12/16/2020   Mental disorder    depression   Neuropathy    feet    Pain    arthritis pain - takes tramadol as needed   Rectal polyp    very little bleeding with bowel movements- no pain   Stroke ([place]) 11/03/2020   Suicide attempt [facility])     Past Surgical History:  Procedure Laterality Date   ANAL RECTAL MANOMETRY N/A 07/13/2016   Procedure: ANO RECTAL MANOMETRY;  Surgeon: Leighton Ruff, MD;  [facility];  Service: Endoscopy;  Laterality: N/A;   CESAREAN SECTION     EUS N/A 11/21/2012   Procedure: LOWER ENDOSCOPIC ULTRASOUND (EUS);  Surgeon: Arta Silence, MD;  [facility];  Service: Endoscopy;  Laterality: N/A;   FLEXIBLE SIGMOIDOSCOPY N/A  11/21/2012   Procedure: FLEXIBLE SIGMOIDOSCOPY;  Surgeon: Arta Silence, MD;  [facility];  Service: Endoscopy;  Laterality: N/A;   FLEXIBLE SIGMOIDOSCOPY N/A 01/28/2013   Procedure: FLEXIBLE SIGMOIDOSCOPY;  Surgeon: Leighton Ruff, MD;  [facility];  Service: Endoscopy;  Laterality: N/A;   LAPAROSCOPIC LOW ANTERIOR RESECTION N/A 01/29/2013   Procedure: LAPAROSCOPIC LOW ANTERIOR RESECTION, Rigid Proctoscopy;  Surgeon: Leighton Ruff, MD;  [facility];  Service: General;  Laterality: N/A;   LAPAROSCOPIC SIGMOID COLECTOMY N/A 11/14/2012   Procedure: DIAGNOSTIC LAPAROSCOPY AND SIGMOIDMOIDOSCOPY ;  Surgeon: Rolm Bookbinder, MD;  [facility];  Service: General;  Laterality: N/A;   RECTAL ULTRASOUND N/A 07/13/2016   Procedure: RECTAL ULTRASOUND;  Surgeon: Leighton Ruff, MD;  [facility];  Service: Endoscopy;  Laterality: N/A;   TONSILLECTOMY     TONSILLECTOMY AND ADENOIDECTOMY      Vitals:   05/04/21 1403  BP: 140/90  Pulse: 76  SpO2: 95%     Subjective Assessment - 05/04/21 1403     Subjective Pt reports no new compliants or new falls. Pt states she is doing pretty good. Pt stated she got blood work done and it shows she is anemic and that is why she has been so tired. Pt states she has  not gotten her foot brace yet but son will order it after Thanksgiving. Pt reports feeling extremely tired. She reports her glucose was 145 but that she hasn't eaten since 9 but does not feel like her blood sugar is low. Pt did not have a glucose monitor with her. Pt reports taking all her meds.    Pertinent History arthritis, asthma, depression, HTN, neuropathy, suicide attempt, DM, CVA    Patient Stated Goals Pt would like to be able to walk and use her hands better.    Currently in Pain? Yes    Pain Score 3     Pain Location Shoulder    Pain Orientation Left    Pain Descriptors / Indicators Aching    Pain Type Chronic pain    Pain Onset 1 to 4 weeks ago     Aggravating Factors  Lifting arm too high                               [facility] Adult PT Treatment/Exercise - 05/04/21 1406       Transfers   Transfers Sit to Stand;Stand to Sit    Sit to Stand 5: Supervision    Stand to Sit 5: Supervision      Ambulation/Gait   Ambulation/Gait Yes    Ambulation/Gait Assistance 5: Supervision    Ambulation Distance (Feet) 230 Feet   Pt reported feeling fatigue and required a break after each lap   Assistive device Straight cane   w/ quad tip   Gait Pattern Step-through pattern;Decreased stance time - left;Decreased hip/knee flexion - left;Decreased dorsiflexion - left;Decreased arm swing - left    Ambulation Surface Level;Indoor      Neuro Re-ed    Neuro Re-ed Details  In corner w/ chair in front pt performed 30" each rhomberg EO/EC on airex. Pt then performed in rhomberg on airex head turns horizontal/vertical x10 each. Pt required slight UE assist on chair when she lost her balance twice w/ EC. PT provided verbal cues to stand upright and to keep core and glutes tight to maintain balance. PT provided min guard. Pt required rest breaks due to reports of feeling tired.      Exercises   Other Exercises  In corner w/ chair in front pt performed 2x10 of marching w/ UE support. PT provided verbal cues to maintain tight glutes while standing on single lower extremity and to stay up right. PT also cued pt to lift legs up. PT provided min guard. At counter on pt performed step ups on 6 in step. Pt instructed to step up with LLE and down with RLE to facilitate increased weight shift over LLE. PT provided verbal cues to stand up tall. Pt used single UE assist at counter and PT provided min guard. Extended rest breaks required due to pt self-reports of fatigue.                       PT Short Term Goals - 04/20/21 1049       PT SHORT TERM GOAL #1   Title Pt will increase Berg from 49/56 to >53/56 for improved balance and decreased  fall risk.    Baseline 04/20/21 49/56    Time 4    Period Weeks    Status New    Target Date 05/18/21      PT SHORT TERM GOAL #2   Title Pt will ambulate up/down curb  and ramp with cane mod I for improved community access.    Time 4    Period Weeks    Status New    Target Date 05/18/21      PT SHORT TERM GOAL #3   Title Pt will increase gait speed to >0.16ms with cane for improved community mobility.    Baseline 0.615m    Time 4    Period Weeks    Status New    Target Date 05/18/21               PT Long Term Goals - 04/20/21 1052       PT LONG TERM GOAL #1   Title Pt will be independent with progressive HEP for strengthening and balance to continue gains on own. (LTGs due 06/11/21)    Baseline 04/20/21 Pt has been instructed in HEP she is working on . PT continues to add and update.    Time 8    Period Weeks    Status On-going    Target Date 06/11/21      PT LONG TERM GOAL #2   Title Pt will increase DGI from 14/24 to >19/24 for improved balance and gait safety.    Baseline 04/20/21 14/24    Time 8    Period Weeks    Status New    Target Date 06/11/21      PT LONG TERM GOAL #3   Title Pt will ambulate up/down 4 steps with cane in reciprocal pattern for improved community access and functional strength.    Time 8    Period Weeks    Status New    Target Date 06/11/21      PT LONG TERM GOAL #4   Title Pt will ambulate >800' on varied surfaces with cane mod I for improved short community distances.    Baseline 11/1- pt ambulated 550' on varied surface w/ quad tip cane supervision.    Time 8    Period Weeks    Status Revised    Target Date 06/11/21      PT LONG TERM GOAL #5   Title Pt will increase  6 min walk distance to >800' for improved activity tolerance and function.    Baseline 04/20/21 690'    Time 8    Period Weeks    Status New    Target Date 06/11/21      PT LONG TERM GOAL #6   Title FOTO will be completed at d/c to get final measure on self  perceived disability.    Baseline 02/16/21 50. 04/20/21 62    Time 8    Period Weeks    Status Revised    Target Date 06/11/21                   Plan - 05/04/21 1503     Clinical Impression Statement Today's physical therapy session was modified and limited due to pt self-reports of increased tiredness and fatigue. PT checked pt's vitals and provided extended rest breaks due to pt's self-reports of increased fatigue. Pt was still able to complete a modified session focused on lower extremeity strenghtening and balance activities. PT will continue to progress pt as tolerated w/ activities to progress pt towards pt achievement of STG/LTG.    Personal Factors and Comorbidities Comorbidity 3+    Comorbidities arthritis, asthma, depression, HTN, neuropathy, suicide attempt, DM    Examination-Activity Limitations Locomotion Level;Transfers;Stand;Stairs;Squat    Examination-Participation Restrictions Community Activity;Driving;Yard Work;Cleaning  Stability/Clinical Decision Making Evolving/Moderate complexity    Rehab Potential Good    PT Frequency 2x / week    PT Duration 8 weeks    PT Treatment/Interventions ADLs/Self Care Home Management;DME Instruction;Neuromuscular re-education;Manual techniques;Balance training;Therapeutic exercise;Therapeutic activities;Functional mobility training;Stair training;Gait training;Vestibular;Passive range of motion;Orthotic Fit/Training;Patient/family education    PT Next Visit Plan Did Foot Up brace come in?? continue LLE strengthening, static and dynamic balance training. Gait/stair training with cane with quad tip. Curub/ramp training. Step-ups with LLE working on control.    Consulted and Agree with Plan of Care Patient             Patient will benefit from skilled therapeutic intervention in order to improve the following deficits and impairments:  Abnormal gait, Decreased balance, Decreased activity tolerance, Decreased mobility, Decreased  strength, Decreased knowledge of use of DME, Decreased endurance, Impaired sensation  Visit Diagnosis: Muscle weakness (generalized)  Unsteadiness on feet  Other abnormalities of gait and mobility     Problem List Patient Active Problem List   Diagnosis Date Noted   Encounter for loop recorder check 01/02/2021   Loop Biotronik loop implant 12/16/2020 12/16/2020   Leukopenia    Controlled type 2 diabetes mellitus with hyperglycemia, with long-term current use of insulin (HCC)    Vascular headache    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[place]) 52/17/4715   Embolic stroke ([place]) 95/39/6728   Sinus bradycardia on ECG 11/03/2020   Benign essential HTN    Pancytopenia ([place])    Stage 3b chronic kidney disease ([place])    Anemia    Labile blood glucose    Labile blood pressure    Diabetic peripheral neuropathy ([place])    Essential hypertension    AKI (acute kidney injury) ([place])    Ataxia, late effect of cerebrovascular disease    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[place]) 06/30/2020   Acute CVA (cerebrovascular accident) ([place]) 06/26/2020   CVA (cerebral vascular accident) ([place]) 06/25/2020   BMI 32.0-32.9,adult 06/03/2020   Left upper lobe pneumonia 03/30/2020   Chest pain 10/01/2019   MDD (major depressive disorder), recurrent severe, without psychosis ([place]) 05/09/2019   Suicide attempt ([place]) 05/09/2019   Cocaine abuse ([place]) 05/09/2019   Interstitial lung disease ([place]) 06/21/2016   COPD (chronic obstructive pulmonary disease) ([place]) 06/21/2016   Cough 06/21/2016   GERD (gastroesophageal reflux disease) 06/21/2016   Hypersomnia 06/21/2016   Tobacco user 06/21/2016   Rectal cancer ([place]) 01/30/2013    Lottie Mussel, Student-PT 05/04/2021, 3:08 PM  [facility] [address] [place], [place], [address] Phone: [phone]   Fax:  [phone]  Name: Erica [name] MRN: [id number] Date of Birth: 24-Oct-1955

## 2021-05-04 NOTE — Therapy (Signed)
[facility] [address] [place], [place], [address] Phone: [phone]   Fax:  [phone]  Occupational Therapy Treatment  Patient Details  Name: Erica [name] MRN: [id number] Date of Birth: [birth year] Referring Provider (OT): Dr. Letta Pate   Encounter Date: 05/04/2021   OT End of Session - 05/04/21 1322     Visit Number 16    Number of Visits 25    Date for OT Re-Evaluation 05/19/21    Authorization Type Medicare/ Medicaid    Authorization - Visit Number 16    Progress Note Due on Visit 83    OT Start Time 1317    OT Stop Time 1356    OT Time Calculation (min) 39 min    Activity Tolerance Patient tolerated treatment well    Behavior During Therapy [facility] for tasks assessed/performed             Past Medical History:  Diagnosis Date   Arthritis    especially in shoulders   Asthma    Depression    Diabetes mellitus    Embolic stroke ([place]) 02/05/36   GERD (gastroesophageal reflux disease)    Headache(784.0)    "mild"   Hypertension    Loop Biotronik loop implant 12/16/2020 12/16/2020   Mental disorder    depression   Neuropathy    feet    Pain    arthritis pain - takes tramadol as needed   Rectal polyp    very little bleeding with bowel movements- no pain   Stroke ([place]) 11/03/2020   Suicide attempt [facility])     Past Surgical History:  Procedure Laterality Date   ANAL RECTAL MANOMETRY N/A 07/13/2016   Procedure: ANO RECTAL MANOMETRY;  Surgeon: Leighton Ruff, MD;  [facility];  Service: Endoscopy;  Laterality: N/A;   CESAREAN SECTION     EUS N/A 11/21/2012   Procedure: LOWER ENDOSCOPIC ULTRASOUND (EUS);  Surgeon: Arta Silence, MD;  [facility];  Service: Endoscopy;  Laterality: N/A;   FLEXIBLE SIGMOIDOSCOPY N/A 11/21/2012   Procedure: FLEXIBLE SIGMOIDOSCOPY;  Surgeon: Arta Silence, MD;  [facility];  Service: Endoscopy;  Laterality: N/A;   FLEXIBLE SIGMOIDOSCOPY N/A  01/28/2013   Procedure: FLEXIBLE SIGMOIDOSCOPY;  Surgeon: Leighton Ruff, MD;  [facility];  Service: Endoscopy;  Laterality: N/A;   LAPAROSCOPIC LOW ANTERIOR RESECTION N/A 01/29/2013   Procedure: LAPAROSCOPIC LOW ANTERIOR RESECTION, Rigid Proctoscopy;  Surgeon: Leighton Ruff, MD;  [facility];  Service: General;  Laterality: N/A;   LAPAROSCOPIC SIGMOID COLECTOMY N/A 11/14/2012   Procedure: DIAGNOSTIC LAPAROSCOPY AND SIGMOIDMOIDOSCOPY ;  Surgeon: Rolm Bookbinder, MD;  [facility];  Service: General;  Laterality: N/A;   RECTAL ULTRASOUND N/A 07/13/2016   Procedure: RECTAL ULTRASOUND;  Surgeon: Leighton Ruff, MD;  [facility];  Service: Endoscopy;  Laterality: N/A;   TONSILLECTOMY     TONSILLECTOMY AND ADENOIDECTOMY      There were no vitals filed for this visit.   Subjective Assessment - 05/04/21 1321     Subjective  "just a little sleepy today"    Pertinent History Referred by [name] 01/21/21 , post-stroke  History of right parieto-occipital infarct. Most recent stroke 11/03/20. PMH: arthritis, asthma, depression, HTN, neuropathy, suicide attempt, DM    Patient Stated Goals do more stuff around the house    Currently in Pain? Yes    Pain Score 4     Pain Location Shoulder    Pain Orientation Left    Pain Descriptors /  Indicators Aching;Sore    Pain Type Chronic pain    Pain Onset 1 to 4 weeks ago    Pain Frequency Constant                          OT Treatments/Exercises (OP) - 05/04/21 1327       ADLs   Functional Mobility walking around clinic and working on walker safety and navigating around obstacles for obtaining cones around gym. Pt had good safety awareness and good rolling walker management with no cueing needed. Pt missed 3 cones out of 10 on pass around clinic. Pt trialed walker tray last session and reports needing to get a new one.    Home Maintenance retrieved clothes from front load dryer with good safety awareness  and rolling walker management today. No verbal cues needed for safety.      Exercises   Exercises Hand      Fine Motor Coordination (Hand/Wrist)   Fine Motor Coordination Small Pegboard    Small Pegboard small pegboard with LUE with good coordination and copying pattern (3 color squares)                      OT Short Term Goals - 03/30/21 1127       OT SHORT TERM GOAL #1   Title I with inital  HEP- 03/24/21    Time 4    Period Weeks    Status Achieved    Target Date 03/24/21      OT SHORT TERM GOAL #2   Title I with sensory precautions    Time 4    Period Weeks    Status Achieved   pt  verbalized understanding of precautions 03/23/21     OT SHORT TERM GOAL #3   Title Pt will perform basic home mangement with supervision    Time 4    Period Weeks    Status Achieved   pt reports doing laundry consistently from w/c level in apt 03/23/21     OT SHORT TERM GOAL #4   Title Pt will perform basic cooking with supervision demonstrating good safety awareness.    Time 4    Period Weeks    Status On-going   03/17/21, supervision and min v.c for walker safety     OT SHORT TERM GOAL #5   Title Pt will perform all basic ADLS modified independently.    Time 4    Period Weeks    Status Achieved   goes to beauty shop to get hair shampooed 03/10/21     OT SHORT TERM GOAL #6   Title Pt will retrieve a lightweight object at 115 shoulder flexion with LUE without dropping    Baseline 105*    Time 4    Period Weeks    Status Achieved   min v/c for decreasing abduction 03/23/21              OT Long Term Goals - 04/13/21 1238       OT LONG TERM GOAL #1   Title I with updated HEP. 05/19/21    Time 12    Period Weeks    Status On-going      OT LONG TERM GOAL #2   Title Pt will demonstrate improved fine motor coordination for ADLs as evidenced  by decreasing LUE 9 hole peg test to 28 secs without drops.    Time 12    Period  Weeks    Status On-going   28.87,  improved but not yet met     OT LONG TERM GOAL #3   Title Pt will demonstrate ability to retrieve a 2 lbs object from overhead shelf without drops with LUE.    Time 12    Period Weeks    Status On-going      OT LONG TERM GOAL #4   Title Pt will perform all basic ADLs modified Indpendently.    Time 12    Period Weeks    Status Achieved      OT LONG TERM GOAL #5   Title Pt will perfom basic home management/ cooking mod I with good safety awareness.    Time 12    Period Weeks    Status Achieved      OT LONG TERM GOAL #6   Title I with memory compensation strategies.    Time 12    Period Weeks    Status On-going                   Plan - 05/04/21 1333     Clinical Impression Statement Pt with better walker safety today in busier environment.    OT Occupational Profile and History Detailed Assessment- Review of Records and additional review of physical, cognitive, psychosocial history related to current functional performance    Occupational performance deficits (Please refer to evaluation for details): ADL's;IADL's;Leisure    Body Structure / Function / Physical Skills ADL;Endurance;UE functional use;Balance;Flexibility;FMC;Gait;ROM;GMC;Coordination;Decreased knowledge of precautions;Sensation;IADL;Decreased knowledge of use of DME;Dexterity;Mobility    Cognitive Skills Attention;Memory    Rehab Potential Good    Clinical Decision Making Limited treatment options, no task modification necessary    Comorbidities Affecting Occupational Performance: May have comorbidities impacting occupational performance    Modification or Assistance to Complete Evaluation  No modification of tasks or assist necessary to complete eval    OT Frequency 2x / week   plus eval, will likely d/c after 8 weeks dependent on progress   OT Duration 12 weeks    OT Treatment/Interventions Self-care/ADL training;Ultrasound;Patient/family education;DME and/or AE instruction;Gait Training;Passive range of  motion;Balance training;Fluidtherapy;Cryotherapy;Electrical Stimulation;Therapist, nutritional;Therapeutic activities;Manual Therapy;Therapeutic exercise;Cognitive remediation/compensation;Neuromuscular education    Plan continue working towards goals, walker safety, home management    Consulted and Agree with Plan of Care Patient             Patient will benefit from skilled therapeutic intervention in order to improve the following deficits and impairments:   Body Structure / Function / Physical Skills: ADL, Endurance, UE functional use, Balance, Flexibility, FMC, Gait, ROM, GMC, Coordination, Decreased knowledge of precautions, Sensation, IADL, Decreased knowledge of use of DME, Dexterity, Mobility Cognitive Skills: Attention, Memory     Visit Diagnosis: Muscle weakness (generalized)  Unsteadiness on feet  Other abnormalities of gait and mobility  Other lack of coordination  Attention and concentration deficit  Frontal lobe and executive function deficit  Other disturbances of skin sensation    Problem List Patient Active Problem List   Diagnosis Date Noted   Encounter for loop recorder check 01/02/2021   Loop Biotronik loop implant 12/16/2020 12/16/2020   Leukopenia    Controlled type 2 diabetes mellitus with hyperglycemia, with long-term current use of insulin (HCC)    Vascular headache    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[place]) 32/35/5732   Embolic stroke ([place]) 20/25/4270   Sinus bradycardia on ECG 11/03/2020   Benign essential HTN    Pancytopenia ([place])    Stage  3b chronic kidney disease ([place])    Anemia    Labile blood glucose    Labile blood pressure    Diabetic peripheral neuropathy ([place])    Essential hypertension    AKI (acute kidney injury) ([place])    Ataxia, late effect of cerebrovascular disease    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[place]) 06/30/2020   Acute CVA (cerebrovascular accident) ([place]) 06/26/2020   CVA (cerebral vascular accident) ([place]) 06/25/2020   BMI  32.0-32.9,adult 06/03/2020   Left upper lobe pneumonia 03/30/2020   Chest pain 10/01/2019   MDD (major depressive disorder), recurrent severe, without psychosis ([place]) 05/09/2019   Suicide attempt ([place]) 05/09/2019   Cocaine abuse ([place]) 05/09/2019   Interstitial lung disease ([place]) 06/21/2016   COPD (chronic obstructive pulmonary disease) ([place]) 06/21/2016   Cough 06/21/2016   GERD (gastroesophageal reflux disease) 06/21/2016   Hypersomnia 06/21/2016   Tobacco user 06/21/2016   Rectal cancer ([place]) 01/30/2013    Zachery Conch, OT/L 05/04/2021, 3:53 PM  [facility] [address] [place], [place], [address] Phone: [phone]   Fax:  [phone]  Name: Erica [name] MRN: [id number] Date of Birth: September 03, 1955

## 2021-05-05 ENCOUNTER — Encounter: Payer: Self-pay | Admitting: Occupational Therapy

## 2021-05-05 ENCOUNTER — Ambulatory Visit: Payer: Medicare HMO

## 2021-05-05 ENCOUNTER — Ambulatory Visit: Payer: Medicare HMO | Admitting: Occupational Therapy

## 2021-05-05 DIAGNOSIS — R2681 Unsteadiness on feet: Secondary | ICD-10-CM

## 2021-05-05 DIAGNOSIS — M6281 Muscle weakness (generalized): Secondary | ICD-10-CM

## 2021-05-05 DIAGNOSIS — I69398 Other sequelae of cerebral infarction: Secondary | ICD-10-CM | POA: Diagnosis not present

## 2021-05-05 DIAGNOSIS — R41840 Attention and concentration deficit: Secondary | ICD-10-CM

## 2021-05-05 DIAGNOSIS — R2689 Other abnormalities of gait and mobility: Secondary | ICD-10-CM

## 2021-05-05 DIAGNOSIS — R278 Other lack of coordination: Secondary | ICD-10-CM

## 2021-05-05 DIAGNOSIS — R208 Other disturbances of skin sensation: Secondary | ICD-10-CM

## 2021-05-05 DIAGNOSIS — R41844 Frontal lobe and executive function deficit: Secondary | ICD-10-CM

## 2021-05-05 NOTE — Therapy (Signed)
[facility] [address] [place], [place], [address] Phone: [phone]   Fax:  [phone]  Occupational Therapy Treatment  Patient Details  Name: Erica [name] MRN: [id number] Date of Birth: 20-Feb-1956 Referring Provider (OT): Dr. Letta Pate   Encounter Date: 05/05/2021   OT End of Session - 05/05/21 1017     Visit Number 17    Number of Visits 25    Date for OT Re-Evaluation 05/19/21    Authorization Type Medicare/ Medicaid    Authorization - Visit Number 17    Progress Note Due on Visit 20    OT Start Time 1016    OT Stop Time 1056    OT Time Calculation (min) 40 min    Activity Tolerance Patient tolerated treatment well    Behavior During Therapy [facility] for tasks assessed/performed             Past Medical History:  Diagnosis Date   Arthritis    especially in shoulders   Asthma    Depression    Diabetes mellitus    Embolic stroke ([place]) 08/21/2160   GERD (gastroesophageal reflux disease)    Headache(784.0)    "mild"   Hypertension    Loop Biotronik loop implant 12/16/2020 12/16/2020   Mental disorder    depression   Neuropathy    feet    Pain    arthritis pain - takes tramadol as needed   Rectal polyp    very little bleeding with bowel movements- no pain   Stroke ([place]) 11/03/2020   Suicide attempt [facility])     Past Surgical History:  Procedure Laterality Date   ANAL RECTAL MANOMETRY N/A 07/13/2016   Procedure: ANO RECTAL MANOMETRY;  Surgeon: Leighton Ruff, MD;  [facility];  Service: Endoscopy;  Laterality: N/A;   CESAREAN SECTION     EUS N/A 11/21/2012   Procedure: LOWER ENDOSCOPIC ULTRASOUND (EUS);  Surgeon: Arta Silence, MD;  Location: [facility];  Service: Endoscopy;  Laterality: N/A;   FLEXIBLE SIGMOIDOSCOPY N/A 11/21/2012   Procedure: FLEXIBLE SIGMOIDOSCOPY;  Surgeon: Arta Silence, MD;  [facility];  Service: Endoscopy;  Laterality: N/A;   FLEXIBLE SIGMOIDOSCOPY N/A  01/28/2013   Procedure: FLEXIBLE SIGMOIDOSCOPY;  Surgeon: Leighton Ruff, MD;  [facility];  Service: Endoscopy;  Laterality: N/A;   LAPAROSCOPIC LOW ANTERIOR RESECTION N/A 01/29/2013   Procedure: LAPAROSCOPIC LOW ANTERIOR RESECTION, Rigid Proctoscopy;  Surgeon: Leighton Ruff, MD;  [facility] ORS;  Service: General;  Laterality: N/A;   LAPAROSCOPIC SIGMOID COLECTOMY N/A 11/14/2012   Procedure: DIAGNOSTIC LAPAROSCOPY AND SIGMOIDMOIDOSCOPY ;  Surgeon: Rolm Bookbinder, MD;  [facility];  Service: General;  Laterality: N/A;   RECTAL ULTRASOUND N/A 07/13/2016   Procedure: RECTAL ULTRASOUND;  Surgeon: Leighton Ruff, MD;  [facility];  Service: Endoscopy;  Laterality: N/A;   TONSILLECTOMY     TONSILLECTOMY AND ADENOIDECTOMY      There were no vitals filed for this visit.   Subjective Assessment - 05/05/21 1016     Subjective  "Started my mashed potatoes yesterday and it didn't take long"    Pertinent History Referred by [name] 01/21/21 , post-stroke  History of right parieto-occipital infarct. Most recent stroke 11/03/20. PMH: arthritis, asthma, depression, HTN, neuropathy, suicide attempt, DM    Patient Stated Goals do more stuff around the house    Currently in Pain? Yes    Pain Score 4     Pain Location Shoulder    Pain Orientation Left  Pain Descriptors / Indicators Aching    Pain Type Chronic pain    Pain Onset 1 to 4 weeks ago    Pain Frequency Constant             Envionrmental Scanning w Walker Management with 1 verbal cue req'd for getting closer to target to decreased distance of reach. Pt had no LOB and reached high and low for items. Pt retrieved 6/10 on first pass and req'd mod cues for locating remaining 4 on second pass.  Grooved Pegs with LUE and good time and no apparent difficulty.  Dynamic Standing/Reaching with LUE with resistance clothespins and gentle trunk rotation to left. Pt resistant to weight shifting and rotating to left  all the way but no LOB with activity. Pt reached up with LUE with min verbal cues and tactile cues for movement pattern.   Reviewed seated cane/dowel exercises.    [facility] OT Assessment - 05/05/21 0001       Coordination   Left 9 Hole Peg Test 32s                             OT Short Term Goals - 03/30/21 1127       OT SHORT TERM GOAL #1   Title I with inital  HEP- 03/24/21    Time 4    Period Weeks    Status Achieved    Target Date 03/24/21      OT SHORT TERM GOAL #2   Title I with sensory precautions    Time 4    Period Weeks    Status Achieved   pt  verbalized understanding of precautions 03/23/21     OT SHORT TERM GOAL #3   Title Pt will perform basic home mangement with supervision    Time 4    Period Weeks    Status Achieved   pt reports doing laundry consistently from w/c level in apt 03/23/21     OT SHORT TERM GOAL #4   Title Pt will perform basic cooking with supervision demonstrating good safety awareness.    Time 4    Period Weeks    Status On-going   03/17/21, supervision and min v.c for walker safety     OT SHORT TERM GOAL #5   Title Pt will perform all basic ADLS modified independently.    Time 4    Period Weeks    Status Achieved   goes to beauty shop to get hair shampooed 03/10/21     OT SHORT TERM GOAL #6   Title Pt will retrieve a lightweight object at 115 shoulder flexion with LUE without dropping    Baseline 105*    Time 4    Period Weeks    Status Achieved   min v/c for decreasing abduction 03/23/21              OT Long Term Goals - 05/05/21 1033       OT LONG TERM GOAL #1   Title I with updated HEP. 05/19/21    Time 12    Period Weeks    Status On-going      OT LONG TERM GOAL #2   Title Pt will demonstrate improved fine motor coordination for ADLs as evidenced  by decreasing LUE 9 hole peg test to 28 secs without drops.    Time 12    Period Weeks    Status On-going   28.87, improved  but not yet met, 32s  05/05/21     OT LONG TERM GOAL #3   Title Pt will demonstrate ability to retrieve a 2 lbs object from overhead shelf without drops with LUE.    Time 12    Period Weeks    Status On-going      OT LONG TERM GOAL #4   Title Pt will perform all basic ADLs modified Indpendently.    Time 12    Period Weeks    Status Achieved      OT LONG TERM GOAL #5   Title Pt will perfom basic home management/ cooking mod I with good safety awareness.    Time 12    Period Weeks    Status Achieved      OT LONG TERM GOAL #6   Title I with memory compensation strategies.    Time 12    Period Weeks    Status On-going                   Plan - 05/05/21 1035     Clinical Impression Statement Pt continues to report increased independence and progressing towards goals.    OT Occupational Profile and History Detailed Assessment- Review of Records and additional review of physical, cognitive, psychosocial history related to current functional performance    Occupational performance deficits (Please refer to evaluation for details): ADL's;IADL's;Leisure    Body Structure / Function / Physical Skills ADL;Endurance;UE functional use;Balance;Flexibility;FMC;Gait;ROM;GMC;Coordination;Decreased knowledge of precautions;Sensation;IADL;Decreased knowledge of use of DME;Dexterity;Mobility    Cognitive Skills Attention;Memory    Rehab Potential Good    Clinical Decision Making Limited treatment options, no task modification necessary    Comorbidities Affecting Occupational Performance: May have comorbidities impacting occupational performance    Modification or Assistance to Complete Evaluation  No modification of tasks or assist necessary to complete eval    OT Frequency 2x / week   plus eval, will likely d/c after 8 weeks dependent on progress   OT Duration 12 weeks    OT Treatment/Interventions Self-care/ADL training;Ultrasound;Patient/family education;DME and/or AE instruction;Gait Training;Passive range  of motion;Balance training;Fluidtherapy;Cryotherapy;Electrical Stimulation;Therapist, nutritional;Therapeutic activities;Manual Therapy;Therapeutic exercise;Cognitive remediation/compensation;Neuromuscular education    Plan continue working towards goals, walker safety, home management, check goals    Consulted and Agree with Plan of Care Patient             Patient will benefit from skilled therapeutic intervention in order to improve the following deficits and impairments:   Body Structure / Function / Physical Skills: ADL, Endurance, UE functional use, Balance, Flexibility, FMC, Gait, ROM, GMC, Coordination, Decreased knowledge of precautions, Sensation, IADL, Decreased knowledge of use of DME, Dexterity, Mobility Cognitive Skills: Attention, Memory     Visit Diagnosis: Muscle weakness (generalized)  Unsteadiness on feet  Other abnormalities of gait and mobility  Other lack of coordination  Attention and concentration deficit  Frontal lobe and executive function deficit  Other disturbances of skin sensation    Problem List Patient Active Problem List   Diagnosis Date Noted   Encounter for loop recorder check 01/02/2021   Loop Biotronik loop implant 12/16/2020 12/16/2020   Leukopenia    Controlled type 2 diabetes mellitus with hyperglycemia, with long-term current use of insulin (HCC)    Vascular headache    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[place]) 15/72/6203   Embolic stroke ([place]) 55/97/4163   Sinus bradycardia on ECG 11/03/2020   Benign essential HTN    Pancytopenia (HCC)    Stage 3b chronic kidney disease ([place])  Anemia    Labile blood glucose    Labile blood pressure    Diabetic peripheral neuropathy (HCC)    Essential hypertension    AKI (acute kidney injury) ([place])    Ataxia, late effect of cerebrovascular disease    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[place]) 06/30/2020   Acute CVA (cerebrovascular accident) ([place]) 06/26/2020   CVA (cerebral vascular accident) ([place])  06/25/2020   BMI 32.0-32.9,adult 06/03/2020   Left upper lobe pneumonia 03/30/2020   Chest pain 10/01/2019   MDD (major depressive disorder), recurrent severe, without psychosis ([place]) 05/09/2019   Suicide attempt ([place]) 05/09/2019   Cocaine abuse ([place]) 05/09/2019   Interstitial lung disease ([place]) 06/21/2016   COPD (chronic obstructive pulmonary disease) ([place]) 06/21/2016   Cough 06/21/2016   GERD (gastroesophageal reflux disease) 06/21/2016   Hypersomnia 06/21/2016   Tobacco user 06/21/2016   Rectal cancer ([place]) 01/30/2013    Zachery Conch, OT/L 05/05/2021, 10:59 AM  [facility] [address] [place], [place], [address] Phone: [phone]   Fax:  [phone]  Name: Erica [name] MRN: [id number] Date of Birth: [birth year]

## 2021-05-05 NOTE — Therapy (Signed)
[facility] [address] [place], [place], [address] Phone: [phone]   Fax:  [phone]  Physical Therapy Treatment  Patient Details  Name: Erica [name] MRN: [id number] Date of Birth: 1955-11-15 Referring Provider (PT): [name]   Encounter Date: 05/05/2021   PT End of Session - 05/05/21 1311     Visit Number 17    Number of Visits 29    Date for PT Re-Evaluation 06/11/21    Authorization Type [employer] Medicare    Authorization Time Period 17 visits 9/16-11/4/22; 16 visists from 11/8-12/30/22    Authorization - Visit Number 3    Authorization - Number of Visits 16    Progress Note Due on Visit 32    PT Start Time 0930    PT Stop Time 1015    PT Time Calculation (min) 45 min    Equipment Utilized During Treatment Gait belt    Activity Tolerance Patient tolerated treatment well    Behavior During Therapy [facility] for tasks assessed/performed             Past Medical History:  Diagnosis Date   Arthritis    especially in shoulders   Asthma    Depression    Diabetes mellitus    Embolic stroke ([place]) 7/51/0258   GERD (gastroesophageal reflux disease)    Headache(784.0)    "mild"   Hypertension    Loop Biotronik loop implant 12/16/2020 12/16/2020   Mental disorder    depression   Neuropathy    feet    Pain    arthritis pain - takes tramadol as needed   Rectal polyp    very little bleeding with bowel movements- no pain   Stroke ([place]) 11/03/2020   Suicide attempt [facility])     Past Surgical History:  Procedure Laterality Date   ANAL RECTAL MANOMETRY N/A 07/13/2016   Procedure: ANO RECTAL MANOMETRY;  Surgeon: Leighton Ruff, MD;  [facility];  Service: Endoscopy;  Laterality: N/A;   CESAREAN SECTION     EUS N/A 11/21/2012   Procedure: LOWER ENDOSCOPIC ULTRASOUND (EUS);  Surgeon: Arta Silence, MD;  [facility];  Service: Endoscopy;  Laterality: N/A;   FLEXIBLE SIGMOIDOSCOPY N/A  11/21/2012   Procedure: FLEXIBLE SIGMOIDOSCOPY;  Surgeon: Arta Silence, MD;  [facility];  Service: Endoscopy;  Laterality: N/A;   FLEXIBLE SIGMOIDOSCOPY N/A 01/28/2013   Procedure: FLEXIBLE SIGMOIDOSCOPY;  Surgeon: Leighton Ruff, MD;  [facility];  Service: Endoscopy;  Laterality: N/A;   LAPAROSCOPIC LOW ANTERIOR RESECTION N/A 01/29/2013   Procedure: LAPAROSCOPIC LOW ANTERIOR RESECTION, Rigid Proctoscopy;  Surgeon: Leighton Ruff, MD;  [facility];  Service: General;  Laterality: N/A;   LAPAROSCOPIC SIGMOID COLECTOMY N/A 11/14/2012   Procedure: DIAGNOSTIC LAPAROSCOPY AND SIGMOIDMOIDOSCOPY ;  Surgeon: Rolm Bookbinder, MD;  [facility];  Service: General;  Laterality: N/A;   RECTAL ULTRASOUND N/A 07/13/2016   Procedure: RECTAL ULTRASOUND;  Surgeon: Leighton Ruff, MD;  [facility];  Service: Endoscopy;  Laterality: N/A;   TONSILLECTOMY     TONSILLECTOMY AND ADENOIDECTOMY      There were no vitals filed for this visit.   Subjective Assessment - 05/05/21 0936     Subjective Pt states she is feeling a little better today but still feels tired.    Pertinent History arthritis, asthma, depression, HTN, neuropathy, suicide attempt, DM, CVA    Patient Stated Goals Pt would like to be able to walk and use her hands better.  Currently in Pain? Yes    Pain Score 4     Pain Location Shoulder    Pain Orientation Left    Pain Descriptors / Indicators Aching    Pain Type Chronic pain    Pain Onset 1 to 4 weeks ago                               [facility] Adult PT Treatment/Exercise - 05/05/21 0938       Transfers   Transfers Sit to Stand;Stand to Sit    Sit to Stand 5: Supervision    Stand to Sit 5: Supervision      Ambulation/Gait   Ambulation/Gait Yes    Ambulation/Gait Assistance 5: Supervision    Ambulation Distance (Feet) 460 Feet   Pt required a rest break after 227f   Assistive device Straight cane   quad tip   Gait Pattern  Step-through pattern;Decreased stance time - left;Decreased hip/knee flexion - left;Decreased dorsiflexion - left;Decreased arm swing - left    Ambulation Surface Level;Indoor      Neuro Re-ed    Neuro Re-ed Details  In // pt performed 3 laps of stepping over hurdles reciprocally on blue mat. Pt required occassional UE assit and PT provided min guard and verbal cues for technique. In // pt performed 2 laps of side stepping over low hurdles on blue mat. Pt demosntrated incresaed difficutly while side stepping as compared to reciprocally stepping over hurdles. PT provided min guard and verbal cues to keep toes pointed forward. Pt perforned 3x8 of cone taps while standing on airex in //. Pt would occassionally lose balance while standing on LLE but was able to catch herself w/ //. PT provided min guard and verbal cues for technique. Pt performed standing on rockerboard in A&P direction in //. Pt demonstrated increased sway w/ eyes closed. PT provided min guard.      Exercises   Other Exercises  At stairs pt performed 2x8 of step ups with single UE assist on rail. Pt was instructed to step up with LLE and step down w/ RLE. PT provided pt verbal cues to remind her of her sequence and to stay upright. PT provided min guard.                       PT Short Term Goals - 04/20/21 1049       PT SHORT TERM GOAL #1   Title Pt will increase Berg from 49/56 to >53/56 for improved balance and decreased fall risk.    Baseline 04/20/21 49/56    Time 4    Period Weeks    Status New    Target Date 05/18/21      PT SHORT TERM GOAL #2   Title Pt will ambulate up/down curb and ramp with cane mod I for improved community access.    Time 4    Period Weeks    Status New    Target Date 05/18/21      PT SHORT TERM GOAL #3   Title Pt will increase gait speed to >0.833m with cane for improved community mobility.    Baseline 0.6753m   Time 4    Period Weeks    Status New    Target Date 05/18/21                PT Long Term Goals - 04/20/21 1052  PT LONG TERM GOAL #1   Title Pt will be independent with progressive HEP for strengthening and balance to continue gains on own. (LTGs due 06/11/21)    Baseline 04/20/21 Pt has been instructed in HEP she is working on . PT continues to add and update.    Time 8    Period Weeks    Status On-going    Target Date 06/11/21      PT LONG TERM GOAL #2   Title Pt will increase DGI from 14/24 to >19/24 for improved balance and gait safety.    Baseline 04/20/21 14/24    Time 8    Period Weeks    Status New    Target Date 06/11/21      PT LONG TERM GOAL #3   Title Pt will ambulate up/down 4 steps with cane in reciprocal pattern for improved community access and functional strength.    Time 8    Period Weeks    Status New    Target Date 06/11/21      PT LONG TERM GOAL #4   Title Pt will ambulate >800' on varied surfaces with cane mod I for improved short community distances.    Baseline 11/1- pt ambulated 550' on varied surface w/ quad tip cane supervision.    Time 8    Period Weeks    Status Revised    Target Date 06/11/21      PT LONG TERM GOAL #5   Title Pt will increase  6 min walk distance to >800' for improved activity tolerance and function.    Baseline 04/20/21 690'    Time 8    Period Weeks    Status New    Target Date 06/11/21      PT LONG TERM GOAL #6   Title FOTO will be completed at d/c to get final measure on self perceived disability.    Baseline 02/16/21 50. 04/20/21 62    Time 8    Period Weeks    Status Revised    Target Date 06/11/21                   Plan - 05/05/21 1320     Clinical Impression Statement Pt presents to the clinic w/ increased activity tolerance compared to yesterday's session. Pt was able to tolerated a session focused on dynamic balance activities as well as LLE strengthening activities. Pt still presents w/ difficutly in maintaining balance on compliant surfaces, eyes  closed, and w/ decreased base of support. Pt tolerated today's session w/ appropriate fatigue. PT will continue to progress pt as tolerated w/ activites towards achieving LTG.    Personal Factors and Comorbidities Comorbidity 3+    Comorbidities arthritis, asthma, depression, HTN, neuropathy, suicide attempt, DM    Examination-Activity Limitations Locomotion Level;Transfers;Stand;Stairs;Squat    Examination-Participation Restrictions Community Activity;Driving;Yard Work;Cleaning    Stability/Clinical Decision Making Evolving/Moderate complexity    Rehab Potential Good    PT Frequency 2x / week    PT Duration 8 weeks    PT Treatment/Interventions ADLs/Self Care Home Management;DME Instruction;Neuromuscular re-education;Manual techniques;Balance training;Therapeutic exercise;Therapeutic activities;Functional mobility training;Stair training;Gait training;Vestibular;Passive range of motion;Orthotic Fit/Training;Patient/family education    PT Next Visit Plan Did Foot Up brace come in?? continue LLE strengthening, static and dynamic balance training. Gait/stair training with cane with quad tip. Curub/ramp training. Step-ups with LLE working on control.    Consulted and Agree with Plan of Care Patient  Patient will benefit from skilled therapeutic intervention in order to improve the following deficits and impairments:  Abnormal gait, Decreased balance, Decreased activity tolerance, Decreased mobility, Decreased strength, Decreased knowledge of use of DME, Decreased endurance, Impaired sensation  Visit Diagnosis: Muscle weakness (generalized)  Unsteadiness on feet  Other abnormalities of gait and mobility     Problem List Patient Active Problem List   Diagnosis Date Noted   Encounter for loop recorder check 01/02/2021   Loop Biotronik loop implant 12/16/2020 12/16/2020   Leukopenia    Controlled type 2 diabetes mellitus with hyperglycemia, with long-term current use of insulin  (HCC)    Vascular headache    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[place]) 29/47/6546   Embolic stroke ([place]) 50/35/4656   Sinus bradycardia on ECG 11/03/2020   Benign essential HTN    Pancytopenia ([place])    Stage 3b chronic kidney disease ([place])    Anemia    Labile blood glucose    Labile blood pressure    Diabetic peripheral neuropathy ([place])    Essential hypertension    AKI (acute kidney injury) ([place])    Ataxia, late effect of cerebrovascular disease    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[place]) 06/30/2020   Acute CVA (cerebrovascular accident) ([place]) 06/26/2020   CVA (cerebral vascular accident) ([place]) 06/25/2020   BMI 32.0-32.9,adult 06/03/2020   Left upper lobe pneumonia 03/30/2020   Chest pain 10/01/2019   MDD (major depressive disorder), recurrent severe, without psychosis ([place]) 05/09/2019   Suicide attempt ([place]) 05/09/2019   Cocaine abuse ([place]) 05/09/2019   Interstitial lung disease ([place]) 06/21/2016   COPD (chronic obstructive pulmonary disease) ([place]) 06/21/2016   Cough 06/21/2016   GERD (gastroesophageal reflux disease) 06/21/2016   Hypersomnia 06/21/2016   Tobacco user 06/21/2016   Rectal cancer ([place]) 01/30/2013    Lottie Mussel, Student-PT 05/05/2021, 1:23 PM  [facility] [address] [place], [place], [address] Phone: [phone]   Fax:  [phone]  Name: ANAIYAH ANGLEMYER MRN: [id number] Date of Birth: 1956/03/30

## 2021-05-11 ENCOUNTER — Other Ambulatory Visit: Payer: Self-pay

## 2021-05-11 ENCOUNTER — Ambulatory Visit: Payer: Medicare HMO

## 2021-05-11 ENCOUNTER — Ambulatory Visit: Payer: Medicare HMO | Admitting: Occupational Therapy

## 2021-05-11 DIAGNOSIS — R2681 Unsteadiness on feet: Secondary | ICD-10-CM

## 2021-05-11 DIAGNOSIS — R278 Other lack of coordination: Secondary | ICD-10-CM

## 2021-05-11 DIAGNOSIS — M6281 Muscle weakness (generalized): Secondary | ICD-10-CM

## 2021-05-11 DIAGNOSIS — R41844 Frontal lobe and executive function deficit: Secondary | ICD-10-CM

## 2021-05-11 DIAGNOSIS — I69398 Other sequelae of cerebral infarction: Secondary | ICD-10-CM | POA: Diagnosis not present

## 2021-05-11 DIAGNOSIS — R41840 Attention and concentration deficit: Secondary | ICD-10-CM

## 2021-05-11 DIAGNOSIS — R208 Other disturbances of skin sensation: Secondary | ICD-10-CM

## 2021-05-11 DIAGNOSIS — R2689 Other abnormalities of gait and mobility: Secondary | ICD-10-CM

## 2021-05-11 NOTE — Patient Instructions (Addendum)
Seated hold your swifter sweeper vertically with base under your feet,  push cane forwards and backwards then side to side in pain free ROM then stir the pot 10 reps each, only with in pain free range of motion                            Keeping Thinking Skills Sharp: 1. Jigsaw puzzles 2. Card/board games 3. Talking on the phone/social events 4. [email or web address] 5. Online games 6. Word serches/crossword puzzles 7.  Logic puzzles 8. Aerobic exercise (stationary bike) 9. Eating balanced diet (fruits & veggies) 10. Drink water 11. Try something new--new recipe, hobby 12. Crafts 13. Do a variety of activities that are challenging

## 2021-05-11 NOTE — Therapy (Signed)
Erica [name] [address] Erica [name] [name], [name], [address] Phone: [phone]   Fax:  [phone]  Occupational Therapy Treatment  Patient Details  Name: Erica [name] MRN: [id number] Date of Birth: 21-Feb-1956 Referring Provider (OT): Erica [name]   Encounter Date: 05/11/2021   OT End of Session - 05/11/21 1056     Visit Number 18    Number of Visits 25    Date for OT Re-Evaluation 05/19/21    Authorization - Visit Number 18    Progress Note Due on Visit 20    OT Start Time 1030    OT Stop Time 1105    OT Time Calculation (min) 35 min    Activity Tolerance Patient tolerated treatment well    Behavior During Therapy [facility] for tasks assessed/performed             Past Medical History:  Diagnosis Date   Arthritis    especially in shoulders   Asthma    Depression    Diabetes mellitus    Embolic stroke ([place]) 07/25/9415   GERD (gastroesophageal reflux disease)    Headache(784.0)    "mild"   Hypertension    Loop Biotronik loop implant 12/16/2020 12/16/2020   Mental disorder    depression   Neuropathy    feet    Pain    arthritis pain - takes tramadol as needed   Rectal polyp    very little bleeding with bowel movements- no pain   Stroke ([place]) 11/03/2020   Suicide attempt [facility])     Past Surgical History:  Procedure Laterality Date   ANAL RECTAL MANOMETRY N/A 07/13/2016   Procedure: ANO RECTAL MANOMETRY;  Surgeon: Erica Ruff, MD;  [facility];  Service: Endoscopy;  Laterality: N/A;   CESAREAN SECTION     EUS N/A 11/21/2012   Procedure: LOWER ENDOSCOPIC ULTRASOUND (EUS);  Surgeon: Erica Silence, MD;  Location: [facility];  Service: Endoscopy;  Laterality: N/A;   FLEXIBLE SIGMOIDOSCOPY N/A 11/21/2012   Procedure: FLEXIBLE SIGMOIDOSCOPY;  Surgeon: Erica Silence, MD;  [facility];  Service: Endoscopy;  Laterality: N/A;   FLEXIBLE SIGMOIDOSCOPY N/A 01/28/2013   Procedure: FLEXIBLE  SIGMOIDOSCOPY;  Surgeon: Erica Ruff, MD;  [facility];  Service: Endoscopy;  Laterality: N/A;   LAPAROSCOPIC LOW ANTERIOR RESECTION N/A 01/29/2013   Procedure: LAPAROSCOPIC LOW ANTERIOR RESECTION, Rigid Proctoscopy;  Surgeon: Erica Ruff, MD;  [facility];  Service: General;  Laterality: N/A;   LAPAROSCOPIC SIGMOID COLECTOMY N/A 11/14/2012   Procedure: DIAGNOSTIC LAPAROSCOPY AND SIGMOIDMOIDOSCOPY ;  Surgeon: Erica Bookbinder, MD;  [facility]S;  Service: General;  Laterality: N/A;   RECTAL ULTRASOUND N/A 07/13/2016   Procedure: RECTAL ULTRASOUND;  Surgeon: Erica Ruff, MD;  [facility];  Service: Endoscopy;  Laterality: N/A;   TONSILLECTOMY     TONSILLECTOMY AND ADENOIDECTOMY      There were no vitals filed for this visit.   Subjective Assessment - 05/11/21 1031     Subjective  Pt reports shoulder pain    Pertinent History Referred by Erica [name] 01/21/21 , post-stroke  History of right parieto-occipital infarct. Most recent stroke 11/03/20. PMH: arthritis, asthma, depression, HTN, neuropathy, suicide attempt, DM    Patient Stated Goals do more stuff around the house    Currently in Pain? Yes    Pain Score 4     Pain Location Shoulder    Pain Orientation Left    Pain Descriptors / Indicators Aching    Pain Onset  1 to 4 weeks ago    Pain Frequency Constant    Aggravating Factors  lifting too high    Pain Relieving Factors rest                       OCCUPATIONAL THERAPY DISCHARGE SUMMARY    Current functional level related to goals / functional outcomes: Pt made good overall progress however she did not fully meet all goals due to sensory deficits and hx of old shoulder injury. Pt is seeing orthopedics soon to address her shoulder pain.   Remaining deficits: Pain in shoulder, decreased coordination, sensory deficits   Education / Equipment: Pt was educated regarding HEP, safety for ADLs, memory compensations. Pt verbalized  understanding of all education.   Patient agrees to discharge. Patient goals were partially met. Patient is being discharged due to being pleased with the current functional level..              OT Education - 05/11/21 1046     Education Details reviewed previously issued coordination HEP, issued updated HEP for shoulder, reveiwed memory compensations and keeping thinking skills sharp    Person(s) Educated Patient    Methods Explanation;Demonstration;Verbal cues;Handout    Comprehension Verbalized understanding;Returned demonstration              OT Short Term Goals - 05/11/21 1059       OT SHORT TERM GOAL #1   Title I with inital  HEP- 03/24/21    Time 4    Period Weeks    Status Achieved    Target Date 03/24/21      OT SHORT TERM GOAL #2   Title I with sensory precautions    Time 4    Period Weeks    Status Achieved   pt  verbalized understanding of precautions 03/23/21     OT SHORT TERM GOAL #3   Title Pt will perform basic home mangement with supervision    Time 4    Period Weeks    Status Achieved   pt reports doing laundry consistently from w/c level in apt 03/23/21     OT SHORT TERM GOAL #4   Title Pt will perform basic cooking with supervision demonstrating good safety awareness.    Time 4    Period Weeks    Status Achieved      OT SHORT TERM GOAL #5   Title Pt will perform all basic ADLS modified independently.    Time 4    Period Weeks    Status Achieved   goes to beauty shop to get hair shampooed 03/10/21     OT SHORT TERM GOAL #6   Title Pt will retrieve a lightweight object at 115 shoulder flexion with LUE without dropping    Baseline 105*    Time 4    Period Weeks    Status Achieved   min v/c for decreasing abduction 03/23/21              OT Long Term Goals - 05/11/21 1032       OT LONG TERM GOAL #1   Title I with updated HEP. 05/19/21    Time 12    Period Weeks    Status Achieved      OT LONG TERM GOAL #2   Title Pt  will demonstrate improved fine motor coordination for ADLs as evidenced  by decreasing LUE 9 hole peg test to 28 secs without drops.  Time 12    Period Weeks    Status Not Met   30.63, no drops but did not fully achieve     OT LONG TERM GOAL #3   Title Pt will demonstrate ability to retrieve a 2 lbs object from overhead shelf without drops with LUE.    Time 12    Period Weeks    Status Deferred   due to hx of old injury and shoulder pain     OT LONG TERM GOAL #4   Title Pt will perform all basic ADLs modified Indpendently.    Time 12    Period Weeks    Status Achieved      OT LONG TERM GOAL #5   Title Pt will perfom basic home management/ cooking mod I with good safety awareness.    Time 12    Period Weeks    Status Achieved      OT LONG TERM GOAL #6   Title I with memory compensation strategies.    Time 12    Period Weeks    Status Achieved   Pt verbalizes understanding                  Plan - 05/11/21 1032     Clinical Impression Statement Pt demonstrates good overall progress towards goals. Pt agrees with plans for d/c.    OT Occupational Profile and History Detailed Assessment- Review of Records and additional review of physical, cognitive, psychosocial history related to current functional performance    Occupational performance deficits (Please refer to evaluation for details): ADL's;IADL's;Leisure    Body Structure / Function / Physical Skills ADL;Endurance;UE functional use;Balance;Flexibility;FMC;Gait;ROM;GMC;Coordination;Decreased knowledge of precautions;Sensation;IADL;Decreased knowledge of use of DME;Dexterity;Mobility    Cognitive Skills Attention;Memory    Rehab Potential Good    Clinical Decision Making Limited treatment options, no task modification necessary    Comorbidities Affecting Occupational Performance: May have comorbidities impacting occupational performance    Modification or Assistance to Complete Evaluation  No modification of tasks or  assist necessary to complete eval    OT Frequency 2x / week   plus eval, will likely d/c after 8 weeks dependent on progress   OT Duration 12 weeks    OT Treatment/Interventions Self-care/ADL training;Ultrasound;Patient/family education;DME and/or AE instruction;Gait Training;Passive range of motion;Balance training;Fluidtherapy;Cryotherapy;Electrical Stimulation;Therapist, nutritional;Therapeutic activities;Manual Therapy;Therapeutic exercise;Cognitive remediation/compensation;Neuromuscular education    Plan d/c OT    Consulted and Agree with Plan of Care Patient             Patient will benefit from skilled therapeutic intervention in order to improve the following deficits and impairments:   Body Structure / Function / Physical Skills: ADL, Endurance, UE functional use, Balance, Flexibility, FMC, Gait, ROM, GMC, Coordination, Decreased knowledge of precautions, Sensation, IADL, Decreased knowledge of use of DME, Dexterity, Mobility Cognitive Skills: Attention, Memory     Visit Diagnosis: Muscle weakness (generalized)  Other abnormalities of gait and mobility  Other lack of coordination  Attention and concentration deficit  Frontal lobe and executive function deficit  Other disturbances of skin sensation  Unsteadiness on feet    Problem List Patient Active Problem List   Diagnosis Date Noted   Encounter for loop recorder check 01/02/2021   Loop Biotronik loop implant 12/16/2020 12/16/2020   Leukopenia    Controlled type 2 diabetes mellitus with hyperglycemia, with long-term current use of insulin (HCC)    Vascular headache    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[place]) 11/11/5613   Embolic stroke ([place]) 37/94/3276  Sinus bradycardia on ECG 11/03/2020   Benign essential HTN    Pancytopenia (HCC)    Stage 3b chronic kidney disease ([place])    Anemia    Labile blood glucose    Labile blood pressure    Diabetic peripheral neuropathy (HCC)    Essential hypertension    AKI  (acute kidney injury) ([place])    Ataxia, late effect of cerebrovascular disease    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[place]) 06/30/2020   Acute CVA (cerebrovascular accident) ([place]) 06/26/2020   CVA (cerebral vascular accident) ([place]) 06/25/2020   BMI 32.0-32.9,adult 06/03/2020   Left upper lobe pneumonia 03/30/2020   Chest pain 10/01/2019   MDD (major depressive disorder), recurrent severe, without psychosis ([place]) 05/09/2019   Suicide attempt ([place]) 05/09/2019   Cocaine abuse ([place]) 05/09/2019   Interstitial lung disease ([place]) 06/21/2016   COPD (chronic obstructive pulmonary disease) ([place]) 06/21/2016   Cough 06/21/2016   GERD (gastroesophageal reflux disease) 06/21/2016   Hypersomnia 06/21/2016   Tobacco user 06/21/2016   Rectal cancer ([place]) 01/30/2013    Jarin Cornfield, OT/L 05/11/2021, 12:57 PM Theone Murdoch, OTR/L [phone] Phone: [phone] 12:57 PM 05/11/21  [facility] [address] [place], [name], [address] Phone: [phone]   Fax:  [phone]  Name: Erica [name] MRN: [id number] Date of Birth: [birth year]

## 2021-05-12 NOTE — Telephone Encounter (Signed)
Patient returned call about pirfenidone - changing to 844m tablet. Unable to reach on two numbers on file, left VM requesting return call  DKnox Saliva PharmD, MPH, BCPS Clinical Pharmacist (Rheumatology and Pulmonology)

## 2021-05-13 ENCOUNTER — Encounter: Payer: Medicare HMO | Admitting: Occupational Therapy

## 2021-05-13 ENCOUNTER — Ambulatory Visit: Payer: Medicare HMO

## 2021-05-17 ENCOUNTER — Telehealth: Payer: Self-pay | Admitting: Pharmacist

## 2021-05-17 DIAGNOSIS — J439 Emphysema, unspecified: Secondary | ICD-10-CM

## 2021-05-17 DIAGNOSIS — J84112 Idiopathic pulmonary fibrosis: Secondary | ICD-10-CM

## 2021-05-17 MED ORDER — PIRFENIDONE 801 MG PO TABS: 801.0000 mg | ORAL_TABLET | Freq: Three times a day (TID) | ORAL | 1 refills | Status: DC

## 2021-05-17 MED ORDER — SYMBICORT 160-4.5 MCG/ACT IN AERO
2.0000 | INHALATION_SPRAY | Freq: Two times a day (BID) | RESPIRATORY_TRACT | 5 refills | Status: DC
Start: 2021-05-17 — End: 2021-10-18

## 2021-05-17 MED ORDER — SPIRIVA RESPIMAT 1.25 MCG/ACT IN AERS: 2.0000 | INHALATION_SPRAY | Freq: Every day | RESPIRATORY_TRACT | 5 refills | Status: DC

## 2021-05-17 NOTE — Telephone Encounter (Signed)
Called patient and she states she would appreciate being switched to 867m tablets of pirfenidone to reduce pill burden. [facility]  Patient requested refill of Symbicort and Spiriva be sent to local [facility]. She states that she does not have transportation and request medications be mailed. Noted in rx for both meds but advised patient to call [facility] and request mailing of meds.  DKnox Saliva PharmD, MPH, BCPS Clinical Pharmacist (Rheumatology and Pulmonology)

## 2021-05-18 ENCOUNTER — Other Ambulatory Visit: Payer: Self-pay

## 2021-05-18 ENCOUNTER — Ambulatory Visit: Payer: Medicare HMO | Attending: Physical Medicine & Rehabilitation | Admitting: Physical Therapy

## 2021-05-18 VITALS — BP 148/74 | HR 74

## 2021-05-18 DIAGNOSIS — R2689 Other abnormalities of gait and mobility: Secondary | ICD-10-CM | POA: Diagnosis present

## 2021-05-18 DIAGNOSIS — R2681 Unsteadiness on feet: Secondary | ICD-10-CM | POA: Diagnosis present

## 2021-05-18 DIAGNOSIS — M6281 Muscle weakness (generalized): Secondary | ICD-10-CM | POA: Diagnosis present

## 2021-05-18 NOTE — Therapy (Signed)
[facility] [address] [place], [place], [address] Phone: [phone]   Fax:  [phone]  Physical Therapy Treatment  Patient Details  Name: Erica [name] MRN: [id number] Date of Birth: [birth year] Referring Provider (PT): [name]   Encounter Date: 05/18/2021   PT End of Session - 05/18/21 1321     Visit Number 18    Number of Visits 29    Date for PT Re-Evaluation 06/11/21    Authorization Type [employer]    Authorization Time Period 92 visists from 11/8-12/30/22    Authorization - Visit Number 6   visit count corrected this visit   Authorization - Number of Visits 16    Progress Note Due on Visit 39    PT Start Time 1023    PT Stop Time 1104    PT Time Calculation (min) 41 min    Activity Tolerance Patient tolerated treatment well    Behavior During Therapy [facility] for tasks assessed/performed             Past Medical History:  Diagnosis Date   Arthritis    especially in shoulders   Asthma    Depression    Diabetes mellitus    Embolic stroke ([place]) 2/56/3893   GERD (gastroesophageal reflux disease)    Headache(784.0)    "mild"   Hypertension    Loop Biotronik loop implant 12/16/2020 12/16/2020   Mental disorder    depression   Neuropathy    feet    Pain    arthritis pain - takes tramadol as needed   Rectal polyp    very little bleeding with bowel movements- no pain   Stroke ([place]) 11/03/2020   Suicide attempt [facility])     Past Surgical History:  Procedure Laterality Date   ANAL RECTAL MANOMETRY N/A 07/13/2016   Procedure: ANO RECTAL MANOMETRY;  Surgeon: Leighton Ruff, MD;  [facility];  Service: Endoscopy;  Laterality: N/A;   CESAREAN SECTION     EUS N/A 11/21/2012   Procedure: LOWER ENDOSCOPIC ULTRASOUND (EUS);  Surgeon: Arta Silence, MD;  [facility];  Service: Endoscopy;  Laterality: N/A;   FLEXIBLE SIGMOIDOSCOPY N/A 11/21/2012   Procedure: FLEXIBLE  SIGMOIDOSCOPY;  Surgeon: Arta Silence, MD;  [facility];  Service: Endoscopy;  Laterality: N/A;   FLEXIBLE SIGMOIDOSCOPY N/A 01/28/2013   Procedure: FLEXIBLE SIGMOIDOSCOPY;  Surgeon: Leighton Ruff, MD;  [facility];  Service: Endoscopy;  Laterality: N/A;   LAPAROSCOPIC LOW ANTERIOR RESECTION N/A 01/29/2013   Procedure: LAPAROSCOPIC LOW ANTERIOR RESECTION, Rigid Proctoscopy;  Surgeon: Leighton Ruff, MD;  [facility];  Service: General;  Laterality: N/A;   LAPAROSCOPIC SIGMOID COLECTOMY N/A 11/14/2012   Procedure: DIAGNOSTIC LAPAROSCOPY AND SIGMOIDMOIDOSCOPY ;  Surgeon: Rolm Bookbinder, MD;  [facility];  Service: General;  Laterality: N/A;   RECTAL ULTRASOUND N/A 07/13/2016   Procedure: RECTAL ULTRASOUND;  Surgeon: Leighton Ruff, MD;  [facility];  Service: Endoscopy;  Laterality: N/A;   TONSILLECTOMY     TONSILLECTOMY AND ADENOIDECTOMY      Vitals:   05/18/21 1030  BP: (!) 148/74  Pulse: 74     Subjective Assessment - 05/18/21 1028     Subjective Was sick last week; not feeling well.  Pt wonders if she had another small CVA - LLE feels weaker, dragging L foot more.  Also reports new numbness in L top and bottom lip.  Has an appointment with physician on Friday virtual.  Has finished her medication  for UTI: still having frequency.    Pertinent History arthritis, asthma, depression, HTN, neuropathy, suicide attempt, DM, CVA    Patient Stated Goals Pt would like to be able to walk and use her hands better.    Currently in Pain? No/denies    Pain Onset 1 to 4 weeks ago                [facility] PT Assessment - 05/18/21 1038       Ambulation/Gait   Ambulation/Gait Yes    Ambulation/Gait Assistance 4: Min assist    Ambulation/Gait Assistance Details LLE fatigued more quickly and demonstrated increased trendelenburg gait and L foot slap with fatigue    Ambulation Distance (Feet) 115 Feet    Assistive device Straight cane    Ambulation Surface  Level;Indoor    Gait velocity 16 seconds or .625 m/sec    Curb 4: Min assist    Curb Details (indicate cue type and reason) with cane x 2 reps with min A for balance and verbal cues for safe sequencing      Berg Balance Test   Sit to Stand Able to stand without using hands and stabilize independently    Standing Unsupported Able to stand safely 2 minutes    Sitting with Back Unsupported but Feet Supported on Floor or Stool Able to sit safely and securely 2 minutes    Stand to Sit Sits safely with minimal use of hands    Transfers Able to transfer safely, minor use of hands    Standing Unsupported with Eyes Closed Able to stand 10 seconds safely    Standing Unsupported with Feet Together Able to place feet together independently and stand 1 minute safely    From Standing, Reach Forward with Outstretched Arm Can reach confidently >25 cm (10")    From Standing Position, Pick up Object from Floor Able to pick up shoe safely and easily    From Standing Position, Turn to Look Behind Over each Shoulder Looks behind from both sides and weight shifts well    Turn 360 Degrees Able to turn 360 degrees safely but slowly    Standing Unsupported, Alternately Place Feet on Step/Stool Able to complete 4 steps without aid or supervision    Standing Unsupported, One Foot in Front Able to take small step independently and hold 30 seconds    Standing on One Leg Unable to try or needs assist to prevent fall    Total Score 46    Berg comment: 46/56              PT Education - 05/18/21 1320     Education Details handout with foot up brace information for pt to give her son so he can order it; decline in outcome measures; PT to send update to physician for appointment on Friday; educated pt on importance of contacting EMS and going to the ER if symptoms of CVA return or worsen "time is brain".    Person(s) Educated Patient    Methods Explanation    Comprehension Verbalized understanding               PT Short Term Goals - 05/18/21 1052       PT SHORT TERM GOAL #1   Title Pt will increase Berg from 49/56 to >53/56 for improved balance and decreased fall risk.    Baseline 04/20/21 49/56 > decreased to 46/56    Time 4    Period Weeks    Status Not  Met    Target Date 05/18/21      PT SHORT TERM GOAL #2   Title Pt will ambulate up/down curb and ramp with cane mod I for improved community access.    Baseline Min A due to fatigue, weakness, impaired SLS balance    Time 4    Period Weeks    Status Not Met    Target Date 05/18/21      PT SHORT TERM GOAL #3   Title Pt will increase gait speed to >0.74ms with cane for improved community mobility.    Baseline 0.664m > declined slightly to .625 m/sec    Time 4    Period Weeks    Status Not Met    Target Date 05/18/21               PT Long Term Goals - 04/20/21 1052       PT LONG TERM GOAL #1   Title Pt will be independent with progressive HEP for strengthening and balance to continue gains on own. (LTGs due 06/11/21)    Baseline 04/20/21 Pt has been instructed in HEP she is working on . PT continues to add and update.    Time 8    Period Weeks    Status On-going    Target Date 06/11/21      PT LONG TERM GOAL #2   Title Pt will increase DGI from 14/24 to >19/24 for improved balance and gait safety.    Baseline 04/20/21 14/24    Time 8    Period Weeks    Status New    Target Date 06/11/21      PT LONG TERM GOAL #3   Title Pt will ambulate up/down 4 steps with cane in reciprocal pattern for improved community access and functional strength.    Time 8    Period Weeks    Status New    Target Date 06/11/21      PT LONG TERM GOAL #4   Title Pt will ambulate >800' on varied surfaces with cane mod I for improved short community distances.    Baseline 11/1- pt ambulated 550' on varied surface w/ quad tip cane supervision.    Time 8    Period Weeks    Status Revised    Target Date 06/11/21      PT LONG TERM GOAL  #5   Title Pt will increase  6 min walk distance to >800' for improved activity tolerance and function.    Baseline 04/20/21 690'    Time 8    Period Weeks    Status New    Target Date 06/11/21      PT LONG TERM GOAL #6   Title FOTO will be completed at d/c to get final measure on self perceived disability.    Baseline 02/16/21 50. 04/20/21 62    Time 8    Period Weeks    Status Revised    Target Date 06/11/21                   Plan - 05/18/21 1324     Clinical Impression Statement Pt reports not feeling well last week and worsening of LLE weakness, foot drop, fatigue and numbness in lips.  Pt did not seek medical care; vitals have remained stable.  Performed assessment of STG today with pt demonstrating decline in BERG score, gait velocity and and increased amount of assistance for gait with cane after ambulating short distances.  Pt did not meet any STG.  Pt to follow up with PCP on Friday about worsening symptoms.  Pt educated on importance of seeking medical care ASAP if symptoms return or worsen.  Will continue with POC due to regression today.    Personal Factors and Comorbidities Comorbidity 3+    Comorbidities arthritis, asthma, depression, HTN, neuropathy, suicide attempt, DM    Examination-Activity Limitations Locomotion Level;Transfers;Stand;Stairs;Squat    Examination-Participation Restrictions Community Activity;Driving;Yard Work;Cleaning    Stability/Clinical Decision Making Evolving/Moderate complexity    Rehab Potential Good    PT Frequency 2x / week    PT Duration 8 weeks    PT Treatment/Interventions ADLs/Self Care Home Management;DME Instruction;Neuromuscular re-education;Manual techniques;Balance training;Therapeutic exercise;Therapeutic activities;Functional mobility training;Stair training;Gait training;Vestibular;Passive range of motion;Orthotic Fit/Training;Patient/family education    PT Next Visit Plan Did Foot Up brace come in?? Pt reporting increased  weakness in LLE after not feeling well last week - wonders if she had a mini-CVA - sees PCP on Friday.  How is she feeling?  continue LLE strengthening, SLS LLE, static and dynamic balance training. Gait/stair training with cane with quad tip. Curb/ramp training. Step-ups with LLE working on control.    Consulted and Agree with Plan of Care Patient             Patient will benefit from skilled therapeutic intervention in order to improve the following deficits and impairments:  Abnormal gait, Decreased balance, Decreased activity tolerance, Decreased mobility, Decreased strength, Decreased knowledge of use of DME, Decreased endurance, Impaired sensation  Visit Diagnosis: Muscle weakness (generalized)  Other abnormalities of gait and mobility  Unsteadiness on feet     Problem List Patient Active Problem List   Diagnosis Date Noted   Encounter for loop recorder check 01/02/2021   Loop Biotronik loop implant 12/16/2020 12/16/2020   Leukopenia    Controlled type 2 diabetes mellitus with hyperglycemia, with long-term current use of insulin (HCC)    Vascular headache    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[place]) 67/67/2094   Embolic stroke ([place]) 70/96/2836   Sinus bradycardia on ECG 11/03/2020   Benign essential HTN    Pancytopenia ([place])    Stage 3b chronic kidney disease ([place])    Anemia    Labile blood glucose    Labile blood pressure    Diabetic peripheral neuropathy ([place])    Essential hypertension    AKI (acute kidney injury) ([place])    Ataxia, late effect of cerebrovascular disease    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[place]) 06/30/2020   Acute CVA (cerebrovascular accident) ([place]) 06/26/2020   CVA (cerebral vascular accident) ([place]) 06/25/2020   BMI 32.0-32.9,adult 06/03/2020   Left upper lobe pneumonia 03/30/2020   Chest pain 10/01/2019   MDD (major depressive disorder), recurrent severe, without psychosis ([place]) 05/09/2019   Suicide attempt ([place]) 05/09/2019   Cocaine abuse ([place]) 05/09/2019    Interstitial lung disease ([place]) 06/21/2016   COPD (chronic obstructive pulmonary disease) ([place]) 06/21/2016   Cough 06/21/2016   GERD (gastroesophageal reflux disease) 06/21/2016   Hypersomnia 06/21/2016   Tobacco user 06/21/2016   Rectal cancer ([place]) 01/30/2013   Rico Junker, PT, DPT 05/18/21    1:30 PM   [facility] [address] [place], [place], [address] Phone: [phone]   Fax:  [phone]  Name: TASHEKA HOUSEMAN MRN: [id number] Date of Birth: Sep 07, 1955

## 2021-05-20 ENCOUNTER — Ambulatory Visit: Payer: Medicare HMO | Admitting: Physical Therapy

## 2021-05-21 ENCOUNTER — Other Ambulatory Visit: Payer: Self-pay | Admitting: Internal Medicine

## 2021-05-24 LAB — URINE CULTURE
MICRO NUMBER:: 12738082
SPECIMEN QUALITY:: ADEQUATE

## 2021-05-25 ENCOUNTER — Ambulatory Visit: Payer: Medicare HMO

## 2021-05-25 ENCOUNTER — Other Ambulatory Visit: Payer: Self-pay

## 2021-05-25 VITALS — BP 150/82

## 2021-05-25 DIAGNOSIS — R2689 Other abnormalities of gait and mobility: Secondary | ICD-10-CM

## 2021-05-25 DIAGNOSIS — R2681 Unsteadiness on feet: Secondary | ICD-10-CM

## 2021-05-25 DIAGNOSIS — M6281 Muscle weakness (generalized): Secondary | ICD-10-CM | POA: Diagnosis not present

## 2021-05-25 NOTE — Therapy (Signed)
[facility] [address] [place], [place], [address] Phone: [phone]   Fax:  [phone]  Physical Therapy Treatment  Patient Details  Name: Erica [name] MRN: [id number] Date of Birth: [birth year] Referring Provider (PT): [name]   Encounter Date: 05/25/2021   PT End of Session - 05/25/21 1021     Visit Number 19    Number of Visits 29    Date for PT Re-Evaluation 06/11/21    Authorization Type [employer]    Authorization Time Period 96 visists from 11/8-12/30/22    Authorization - Visit Number 7   visit count corrected this visit   Authorization - Number of Visits 16    Progress Note Due on Visit 16    PT Start Time 1016    PT Stop Time 1059    PT Time Calculation (min) 43 min    Activity Tolerance Patient tolerated treatment well    Behavior During Therapy [facility] for tasks assessed/performed             Past Medical History:  Diagnosis Date   Arthritis    especially in shoulders   Asthma    Depression    Diabetes mellitus    Embolic stroke ([place]) 2/83/1517   GERD (gastroesophageal reflux disease)    Headache(784.0)    "mild"   Hypertension    Loop Biotronik loop implant 12/16/2020 12/16/2020   Mental disorder    depression   Neuropathy    feet    Pain    arthritis pain - takes tramadol as needed   Rectal polyp    very little bleeding with bowel movements- no pain   Stroke ([place]) 11/03/2020   Suicide attempt [facility])     Past Surgical History:  Procedure Laterality Date   ANAL RECTAL MANOMETRY N/A 07/13/2016   Procedure: ANO RECTAL MANOMETRY;  Surgeon: Leighton Ruff, MD;  [facility];  Service: Endoscopy;  Laterality: N/A;   CESAREAN SECTION     EUS N/A 11/21/2012   Procedure: LOWER ENDOSCOPIC ULTRASOUND (EUS);  Surgeon: Arta Silence, MD;  [facility];  Service: Endoscopy;  Laterality: N/A;   FLEXIBLE SIGMOIDOSCOPY N/A 11/21/2012   Procedure: FLEXIBLE  SIGMOIDOSCOPY;  Surgeon: Arta Silence, MD;  [facility];  Service: Endoscopy;  Laterality: N/A;   FLEXIBLE SIGMOIDOSCOPY N/A 01/28/2013   Procedure: FLEXIBLE SIGMOIDOSCOPY;  Surgeon: Leighton Ruff, MD;  [facility];  Service: Endoscopy;  Laterality: N/A;   LAPAROSCOPIC LOW ANTERIOR RESECTION N/A 01/29/2013   Procedure: LAPAROSCOPIC LOW ANTERIOR RESECTION, Rigid Proctoscopy;  Surgeon: Leighton Ruff, MD;  [facility];  Service: General;  Laterality: N/A;   LAPAROSCOPIC SIGMOID COLECTOMY N/A 11/14/2012   Procedure: DIAGNOSTIC LAPAROSCOPY AND SIGMOIDMOIDOSCOPY ;  Surgeon: Rolm Bookbinder, MD;  [facility];  Service: General;  Laterality: N/A;   RECTAL ULTRASOUND N/A 07/13/2016   Procedure: RECTAL ULTRASOUND;  Surgeon: Leighton Ruff, MD;  [facility];  Service: Endoscopy;  Laterality: N/A;   TONSILLECTOMY     TONSILLECTOMY AND ADENOIDECTOMY      Vitals:   05/25/21 1022  BP: (!) 150/82     Subjective Assessment - 05/25/21 1022     Subjective Pt reports feeling a little better than last week. Did see PCP on Friday and he did not feel she had another stroke. He did find her BP was a little high but sugar was good. She goes today to Dr. Maxie Better, her orthopedic, for a cortizone shot in left shoulder. Pt  feels her energy is a little better but still not great.    Pertinent History arthritis, asthma, depression, HTN, neuropathy, suicide attempt, DM, CVA    Patient Stated Goals Pt would like to be able to walk and use her hands better.    Currently in Pain? Yes    Pain Score 3     Pain Location Shoulder    Pain Orientation Left    Pain Descriptors / Indicators Aching    Pain Type Chronic pain    Pain Onset 1 to 4 weeks ago    Pain Frequency Intermittent    Aggravating Factors  with raising arm too high                               [facility] Adult PT Treatment/Exercise - 05/25/21 1026       Ambulation/Gait   Ambulation/Gait Yes     Ambulation/Gait Assistance 4: Min guard    Ambulation/Gait Assistance Details Pt was cued to really pick up left foot for more heel strike and squeeze glut as well to stay up tall through left leg. After initial gait PT added left foot up brace and pt ambulated another 150' with cane. Pt had better heel strike and reported it feeling better. 2 notches on shoes were removed then laced in. Pt's should be arriving today. 100' at end of session after stepping activities. Pt with improved left foot clearance.    Ambulation Distance (Feet) 345 Feet    Assistive device Straight cane   with quad tip   Gait Pattern Step-through pattern;Decreased stance time - left;Decreased hip/knee flexion - left;Decreased dorsiflexion - left;Decreased weight shift to left    Ambulation Surface Level;Indoor    Gait Comments At counter: tapping 2 cones and stepping over then over 2 4" bolsters with cane for support x 8 bouts CGA. Verbal cues for sequencing.  Step-ups on 4" step with LLE x 10 with cane CGA then added in tapping cone on other side with RLE to increase left SLS time x 10.                     PT Education - 05/25/21 1210     Education Details Education on donning foot up brace that pt reports should be arriving today.    Person(s) Educated Patient    Methods Explanation;Demonstration    Comprehension Verbalized understanding              PT Short Term Goals - 05/18/21 1052       PT SHORT TERM GOAL #1   Title Pt will increase Berg from 49/56 to >53/56 for improved balance and decreased fall risk.    Baseline 04/20/21 49/56 > decreased to 46/56    Time 4    Period Weeks    Status Not Met    Target Date 05/18/21      PT SHORT TERM GOAL #2   Title Pt will ambulate up/down curb and ramp with cane mod I for improved community access.    Baseline Min A due to fatigue, weakness, impaired SLS balance    Time 4    Period Weeks    Status Not Met    Target Date 05/18/21      PT SHORT TERM  GOAL #3   Title Pt will increase gait speed to >0.78ms with cane for improved community mobility.    Baseline 0.693m > declined slightly  to .625 m/sec    Time 4    Period Weeks    Status Not Met    Target Date 05/18/21               PT Long Term Goals - 04/20/21 1052       PT LONG TERM GOAL #1   Title Pt will be independent with progressive HEP for strengthening and balance to continue gains on own. (LTGs due 06/11/21)    Baseline 04/20/21 Pt has been instructed in HEP she is working on . PT continues to add and update.    Time 8    Period Weeks    Status On-going    Target Date 06/11/21      PT LONG TERM GOAL #2   Title Pt will increase DGI from 14/24 to >19/24 for improved balance and gait safety.    Baseline 04/20/21 14/24    Time 8    Period Weeks    Status New    Target Date 06/11/21      PT LONG TERM GOAL #3   Title Pt will ambulate up/down 4 steps with cane in reciprocal pattern for improved community access and functional strength.    Time 8    Period Weeks    Status New    Target Date 06/11/21      PT LONG TERM GOAL #4   Title Pt will ambulate >800' on varied surfaces with cane mod I for improved short community distances.    Baseline 11/1- pt ambulated 550' on varied surface w/ quad tip cane supervision.    Time 8    Period Weeks    Status Revised    Target Date 06/11/21      PT LONG TERM GOAL #5   Title Pt will increase  6 min walk distance to >800' for improved activity tolerance and function.    Baseline 04/20/21 690'    Time 8    Period Weeks    Status New    Target Date 06/11/21      PT LONG TERM GOAL #6   Title FOTO will be completed at d/c to get final measure on self perceived disability.    Baseline 02/16/21 50. 04/20/21 62    Time 8    Period Weeks    Status Revised    Target Date 06/11/21                   Plan - 05/25/21 1211     Clinical Impression Statement Pt was able to tolerate more activity today between breaks. She  showed improvement with LLE advancement and clearance at end of session. Foot up brace on left does help to get more heel strike. Pt's should be arriving today.    Personal Factors and Comorbidities Comorbidity 3+    Comorbidities arthritis, asthma, depression, HTN, neuropathy, suicide attempt, DM    Examination-Activity Limitations Locomotion Level;Transfers;Stand;Stairs;Squat    Examination-Participation Restrictions Community Activity;Driving;Yard Work;Cleaning    Stability/Clinical Decision Making Evolving/Moderate complexity    Rehab Potential Good    PT Frequency 2x / week    PT Duration 8 weeks    PT Treatment/Interventions ADLs/Self Care Home Management;DME Instruction;Neuromuscular re-education;Manual techniques;Balance training;Therapeutic exercise;Therapeutic activities;Functional mobility training;Stair training;Gait training;Vestibular;Passive range of motion;Orthotic Fit/Training;Patient/family education    PT Next Visit Plan 10th visit progress note next visit. Did Foot Up brace come in?? How is shoulder doing after cortizone shot?   continue LLE strengthening, SLS LLE, static and dynamic  balance training. Gait/stair training with cane with quad tip. Curb/ramp training. Step-ups with LLE working on control.    Consulted and Agree with Plan of Care Patient             Patient will benefit from skilled therapeutic intervention in order to improve the following deficits and impairments:  Abnormal gait, Decreased balance, Decreased activity tolerance, Decreased mobility, Decreased strength, Decreased knowledge of use of DME, Decreased endurance, Impaired sensation  Visit Diagnosis: Other abnormalities of gait and mobility  Muscle weakness (generalized)  Unsteadiness on feet     Problem List Patient Active Problem List   Diagnosis Date Noted   Encounter for loop recorder check 01/02/2021   Loop Biotronik loop implant 12/16/2020 12/16/2020   Leukopenia    Controlled type 2  diabetes mellitus with hyperglycemia, with long-term current use of insulin (HCC)    Vascular headache    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[place]) 94/58/5929   Embolic stroke ([place]) 24/46/2863   Sinus bradycardia on ECG 11/03/2020   Benign essential HTN    Pancytopenia ([place])    Stage 3b chronic kidney disease ([place])    Anemia    Labile blood glucose    Labile blood pressure    Diabetic peripheral neuropathy ([place])    Essential hypertension    AKI (acute kidney injury) ([place])    Ataxia, late effect of cerebrovascular disease    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[place]) 06/30/2020   Acute CVA (cerebrovascular accident) ([place]) 06/26/2020   CVA (cerebral vascular accident) ([place]) 06/25/2020   BMI 32.0-32.9,adult 06/03/2020   Left upper lobe pneumonia 03/30/2020   Chest pain 10/01/2019   MDD (major depressive disorder), recurrent severe, without psychosis ([place]) 05/09/2019   Suicide attempt ([place]) 05/09/2019   Cocaine abuse ([place]) 05/09/2019   Interstitial lung disease ([place]) 06/21/2016   COPD (chronic obstructive pulmonary disease) ([place]) 06/21/2016   Cough 06/21/2016   GERD (gastroesophageal reflux disease) 06/21/2016   Hypersomnia 06/21/2016   Tobacco user 06/21/2016   Rectal cancer ([place]) 01/30/2013    Electa Sniff, PT 05/25/2021, 12:13 PM  [facility] [address] [place], [place], [address] Phone: [phone]   Fax:  [phone]  Name: INIYA MATZEK MRN: [id number] Date of Birth: 10-31-1955

## 2021-05-27 ENCOUNTER — Other Ambulatory Visit: Payer: Self-pay

## 2021-05-27 ENCOUNTER — Ambulatory Visit: Payer: Medicare HMO

## 2021-05-27 DIAGNOSIS — M6281 Muscle weakness (generalized): Secondary | ICD-10-CM | POA: Diagnosis not present

## 2021-05-27 DIAGNOSIS — R2681 Unsteadiness on feet: Secondary | ICD-10-CM

## 2021-05-27 DIAGNOSIS — R2689 Other abnormalities of gait and mobility: Secondary | ICD-10-CM

## 2021-05-27 NOTE — Therapy (Signed)
[facility] [address] [place], [place], [address] Phone: [phone]   Fax:  [phone]  Physical Therapy Treatment/10th visit progress note  Patient Details  Name: Erica [name] MRN: [id number] Date of Birth: 03/21/1956 Referring Provider (PT): [name]    Progress Note  Reporting period 04/06/21 to 05/27/21  See Note below for Objective Data and Assessment of Progress/Goals  Encounter Date: 05/27/2021   PT End of Session - 05/27/21 1020     Visit Number 20    Number of Visits 29    Date for PT Re-Evaluation 06/11/21    Authorization Type [employer]    Authorization Time Period 52 visists from 11/8-12/30/22    Authorization - Visit Number 8   visit count corrected this visit   Authorization - Number of Visits 16    Progress Note Due on Visit 8    PT Start Time 1017    PT Stop Time 1059    PT Time Calculation (min) 42 min    Activity Tolerance Patient tolerated treatment well    Behavior During Therapy [facility] for tasks assessed/performed             Past Medical History:  Diagnosis Date   Arthritis    especially in shoulders   Asthma    Depression    Diabetes mellitus    Embolic stroke ([place]) 7/85/8850   GERD (gastroesophageal reflux disease)    Headache(784.0)    "mild"   Hypertension    Loop Biotronik loop implant 12/16/2020 12/16/2020   Mental disorder    depression   Neuropathy    feet    Pain    arthritis pain - takes tramadol as needed   Rectal polyp    very little bleeding with bowel movements- no pain   Stroke ([place]) 11/03/2020   Suicide attempt [facility])     Past Surgical History:  Procedure Laterality Date   ANAL RECTAL MANOMETRY N/A 07/13/2016   Procedure: ANO RECTAL MANOMETRY;  Surgeon: Leighton Ruff, MD;  [facility];  Service: Endoscopy;  Laterality: N/A;   CESAREAN SECTION     EUS N/A 11/21/2012   Procedure: LOWER ENDOSCOPIC ULTRASOUND (EUS);  Surgeon:  Arta Silence, MD;  [facility];  Service: Endoscopy;  Laterality: N/A;   FLEXIBLE SIGMOIDOSCOPY N/A 11/21/2012   Procedure: FLEXIBLE SIGMOIDOSCOPY;  Surgeon: Arta Silence, MD;  [facility];  Service: Endoscopy;  Laterality: N/A;   FLEXIBLE SIGMOIDOSCOPY N/A 01/28/2013   Procedure: FLEXIBLE SIGMOIDOSCOPY;  Surgeon: Leighton Ruff, MD;  [facility];  Service: Endoscopy;  Laterality: N/A;   LAPAROSCOPIC LOW ANTERIOR RESECTION N/A 01/29/2013   Procedure: LAPAROSCOPIC LOW ANTERIOR RESECTION, Rigid Proctoscopy;  Surgeon: Leighton Ruff, MD;  [facility];  Service: General;  Laterality: N/A;   LAPAROSCOPIC SIGMOID COLECTOMY N/A 11/14/2012   Procedure: DIAGNOSTIC LAPAROSCOPY AND SIGMOIDMOIDOSCOPY ;  Surgeon: Rolm Bookbinder, MD;  [facility];  Service: General;  Laterality: N/A;   RECTAL ULTRASOUND N/A 07/13/2016   Procedure: RECTAL ULTRASOUND;  Surgeon: Leighton Ruff, MD;  [facility];  Service: Endoscopy;  Laterality: N/A;   TONSILLECTOMY     TONSILLECTOMY AND ADENOIDECTOMY      There were no vitals filed for this visit.   Subjective Assessment - 05/27/21 1020     Subjective Pt reports that she got her left shoulder injection and is a little better.    Pertinent History arthritis, asthma, depression, HTN, neuropathy, suicide attempt, DM, CVA  Patient Stated Goals Pt would like to be able to walk and use her hands better.    Currently in Pain? No/denies    Pain Onset 1 to 4 weeks ago                               [facility] Adult PT Treatment/Exercise - 05/27/21 1021       Transfers   Transfers Sit to Stand;Stand to Sit    Sit to Stand 6: Modified independent (Device/Increase time)    Stand to Sit 6: Modified independent (Device/Increase time)      Ambulation/Gait   Ambulation/Gait Yes    Ambulation/Gait Assistance 5: Supervision;4: Min guard    Ambulation/Gait Assistance Details Pt was cued to work on increasing left  hip flexion to really pick up leg and ride left leg back while staying up tall.    Ambulation Distance (Feet) 330 Feet    Assistive device Straight cane   with quad tip and left foot up brace   Gait Pattern Step-through pattern;Decreased hip/knee flexion - left;Decreased dorsiflexion - left;Decreased stance time - left    Ambulation Surface Level;Indoor    Gait velocity 13.84 sec=0.64ms    Ramp 5: Supervision    Ramp Details (indicate cue type and reason) x 2 with cane. Verbal cues to be sure to pick up left foot with ascent. To keep weight forward when going up and back when going down.    Curb 5: Supervision    Curb Details (indicate cue type and reason) x 3 with pt preferring leading with LLE up and down. Cued to place cane down first prior to stepping down.      Neuro Re-ed    Neuro Re-ed Details  Gait around obstacle course: weaving in and out of 4 cones, stepping over 2 4" bolsters, over blue mat x 6 bouts with cane CGA. Verbal cues to increase step leg some and foot clearance on right to clear lip on surface change. SLS on LLE with RLE on soccerball with cane support x 20 sec then adding in moving ball front back x 10 and side to side x 10 CGA with having to reset to straighten left knee more at times.      Exercises   Exercises Other Exercises      Knee/Hip Exercises: Aerobic   Other Aerobic Sci Fit level4 x 5 minutes with BLE and BUE for strengthening and aerobic activity. Pt reported feeling ok when finished and no issues in shoulder.                       PT Short Term Goals - 05/27/21 1032       PT SHORT TERM GOAL #1   Title Pt will increase Berg from 49/56 to >53/56 for improved balance and decreased fall risk.    Baseline 04/20/21 49/56 > decreased to 46/56    Time 4    Period Weeks    Status Not Met    Target Date 05/18/21      PT SHORT TERM GOAL #2   Title Pt will ambulate up/down curb and ramp with cane mod I for improved community access.    Baseline  Min A due to fatigue, weakness, impaired SLS balance. 05/27/21 supervision on ramp and curb    Time 4    Period Weeks    Status Partially Met    Target Date 05/18/21  PT SHORT TERM GOAL #3   Title Pt will increase gait speed to >0.14ms with cane for improved community mobility.    Baseline 0.655m > declined slightly to .625 m/sec. 05/27/21 0.7213mwith cane    Time 4    Period Weeks    Status Partially Met    Target Date 05/18/21               PT Long Term Goals - 04/20/21 1052       PT LONG TERM GOAL #1   Title Pt will be independent with progressive HEP for strengthening and balance to continue gains on own. (LTGs due 06/11/21)    Baseline 04/20/21 Pt has been instructed in HEP she is working on . PT continues to add and update.    Time 8    Period Weeks    Status On-going    Target Date 06/11/21      PT LONG TERM GOAL #2   Title Pt will increase DGI from 14/24 to >19/24 for improved balance and gait safety.    Baseline 04/20/21 14/24    Time 8    Period Weeks    Status New    Target Date 06/11/21      PT LONG TERM GOAL #3   Title Pt will ambulate up/down 4 steps with cane in reciprocal pattern for improved community access and functional strength.    Time 8    Period Weeks    Status New    Target Date 06/11/21      PT LONG TERM GOAL #4   Title Pt will ambulate >800' on varied surfaces with cane mod I for improved short community distances.    Baseline 11/1- pt ambulated 550' on varied surface w/ quad tip cane supervision.    Time 8    Period Weeks    Status Revised    Target Date 06/11/21      PT LONG TERM GOAL #5   Title Pt will increase  6 min walk distance to >800' for improved activity tolerance and function.    Baseline 04/20/21 690'    Time 8    Period Weeks    Status New    Target Date 06/11/21      PT LONG TERM GOAL #6   Title FOTO will be completed at d/c to get final measure on self perceived disability.    Baseline 02/16/21 50. 04/20/21 62     Time 8    Period Weeks    Status Revised    Target Date 06/11/21                   Plan - 05/27/21 1122     Clinical Impression Statement Pt continues to show improved tolerance with needing less rest breaks during session. Increased gait speed from last week just short of goal to 0.37m66mndicating decreased community ambulator with cane. She required less assistance with ramp and curb negotiation. Prefers to step up with left weaker leg.  PT continued to focus more on left SLS during activities. Pt continues to benefit from skilled PT to continue to progress towards goals.    Personal Factors and Comorbidities Comorbidity 3+    Comorbidities arthritis, asthma, depression, HTN, neuropathy, suicide attempt, DM    Examination-Activity Limitations Locomotion Level;Transfers;Stand;Stairs;Squat    Examination-Participation Restrictions Community Activity;Driving;Yard Work;Cleaning    Stability/Clinical Decision Making Evolving/Moderate complexity    Rehab Potential Good    PT Frequency 2x / week  PT Duration 8 weeks    PT Treatment/Interventions ADLs/Self Care Home Management;DME Instruction;Neuromuscular re-education;Manual techniques;Balance training;Therapeutic exercise;Therapeutic activities;Functional mobility training;Stair training;Gait training;Vestibular;Passive range of motion;Orthotic Fit/Training;Patient/family education    PT Next Visit Plan Check LTGs for recert versus d/c. Did Foot Up brace come in?? How is shoulder doing after cortizone shot?   continue LLE strengthening, SLS LLE, static and dynamic balance training. Gait/stair training with cane with quad tip. Curb/ramp training. Step-ups with LLE working on control.    Consulted and Agree with Plan of Care Patient             Patient will benefit from skilled therapeutic intervention in order to improve the following deficits and impairments:  Abnormal gait, Decreased balance, Decreased activity tolerance,  Decreased mobility, Decreased strength, Decreased knowledge of use of DME, Decreased endurance, Impaired sensation  Visit Diagnosis: Other abnormalities of gait and mobility  Muscle weakness (generalized)  Unsteadiness on feet     Problem List Patient Active Problem List   Diagnosis Date Noted   Encounter for loop recorder check 01/02/2021   Loop Biotronik loop implant 12/16/2020 12/16/2020   Leukopenia    Controlled type 2 diabetes mellitus with hyperglycemia, with long-term current use of insulin (HCC)    Vascular headache    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0-32.9,adult 06/03/2020   Left upper lobe pneumonia 03/30/2020   Chest pain 10/01/2019   MDD (major depressive disorder), recurrent severe, without psychosis ([place]) 05/09/2019   Suicide attempt ([place]) 05/09/2019   Cocaine abuse ([place]) 05/09/2019   Interstitial lung disease ([place]) 06/21/2016   COPD (chronic obstructive pulmonary disease) ([place]) 06/21/2016   Cough 06/21/2016   GERD (gastroesophageal reflux disease) 06/21/2016   Hypersomnia 06/21/2016   Tobacco user 06/21/2016   Rectal cancer ([place]) 01/30/2013    Electa Sniff, PT, DPT, NCS 05/27/2021, 11:28 AM  [facility] [address] [place], [place], [address] Phone: [phone]   Fax:  [phone]  Name: Erica [name] MRN: [id number] Date of Birth: 08/08/1955

## 2021-06-01 ENCOUNTER — Ambulatory Visit: Payer: Medicare HMO

## 2021-06-03 ENCOUNTER — Other Ambulatory Visit: Payer: Self-pay

## 2021-06-03 ENCOUNTER — Ambulatory Visit: Payer: Medicare HMO

## 2021-06-03 DIAGNOSIS — M6281 Muscle weakness (generalized): Secondary | ICD-10-CM

## 2021-06-03 DIAGNOSIS — R2681 Unsteadiness on feet: Secondary | ICD-10-CM

## 2021-06-03 DIAGNOSIS — R2689 Other abnormalities of gait and mobility: Secondary | ICD-10-CM

## 2021-06-03 NOTE — Therapy (Signed)
[facility] [address] [place], [place], [address] Phone: [phone]   Fax:  [phone]  Physical Therapy Treatment/ Recert  Patient Details  Name: Erica [name] MRN: [id number] Date of Birth: 11-Feb-1956 Referring Provider (PT): [name]   Encounter Date: 06/03/2021   PT End of Session - 06/03/21 1011     Visit Number 21    Number of Visits 29    Date for PT Re-Evaluation 07/30/21    Authorization Type [employer]    Authorization Time Period 34 visists from 11/8-12/30/22    Authorization - Visit Number 9   visit count corrected this visit   Authorization - Number of Visits 16    Progress Note Due on Visit 54    PT Start Time 1011    PT Stop Time 1057    PT Time Calculation (min) 46 min    Equipment Utilized During Treatment Gait belt    Activity Tolerance Patient limited by fatigue    Behavior During Therapy [facility] for tasks assessed/performed             Past Medical History:  Diagnosis Date   Arthritis    especially in shoulders   Asthma    Depression    Diabetes mellitus    Embolic stroke ([place]) 2/72/5366   GERD (gastroesophageal reflux disease)    Headache(784.0)    "mild"   Hypertension    Loop Biotronik loop implant 12/16/2020 12/16/2020   Mental disorder    depression   Neuropathy    feet    Pain    arthritis pain - takes tramadol as needed   Rectal polyp    very little bleeding with bowel movements- no pain   Stroke ([place]) 11/03/2020   Suicide attempt [facility])     Past Surgical History:  Procedure Laterality Date   ANAL RECTAL MANOMETRY N/A 07/13/2016   Procedure: ANO RECTAL MANOMETRY;  Surgeon: Leighton Ruff, MD;  Location: [facility] END[facility];  Service: Endoscopy;  Laterality: N/A;   CESAREAN SECTION     EUS N/A 11/21/2012   Procedure: LOWER ENDOSCOPIC ULTRASOUND (EUS);  Surgeon: Arta Silence, MD;  Location: [facility];  Service: Endoscopy;  Laterality: N/A;   FLEXIBLE  SIGMOIDOSCOPY N/A 11/21/2012   Procedure: FLEXIBLE SIGMOIDOSCOPY;  Surgeon: Arta Silence, MD;  Location: [facility];  Service: Endoscopy;  Laterality: N/A;   FLEXIBLE SIGMOIDOSCOPY N/A 01/28/2013   Procedure: FLEXIBLE SIGMOIDOSCOPY;  Surgeon: Leighton Ruff, MD;  Location: [facility];  Service: Endoscopy;  Laterality: N/A;   LAPAROSCOPIC LOW ANTERIOR RESECTION N/A 01/29/2013   Procedure: LAPAROSCOPIC LOW ANTERIOR RESECTION, Rigid Proctoscopy;  Surgeon: Leighton Ruff, MD;  Location: [facility];  Service: General;  Laterality: N/A;   LAPAROSCOPIC SIGMOID COLECTOMY N/A 11/14/2012   Procedure: DIAGNOSTIC LAPAROSCOPY AND SIGMOIDMOIDOSCOPY ;  Surgeon: Rolm Bookbinder, MD;  Location: [facility];  Service: General;  Laterality: N/A;   RECTAL ULTRASOUND N/A 07/13/2016   Procedure: RECTAL ULTRASOUND;  Surgeon: Leighton Ruff, MD;  Location: [facility];  Service: Endoscopy;  Laterality: N/A;   TONSILLECTOMY     TONSILLECTOMY AND ADENOIDECTOMY      There were no vitals filed for this visit.   Subjective Assessment - 06/03/21 1011     Subjective Pt reports that she feels some days her left leg is stronger and some days its weaker. She uses walker or cane depending how its feeling. Pt reports that she did not sleep well last night.    Pertinent History arthritis, asthma, depression, HTN,  neuropathy, suicide attempt, DM, CVA    Patient Stated Goals Pt would like to be able to walk and use her hands better.    Currently in Pain? Yes    Pain Score 3     Pain Location Shoulder    Pain Orientation Left    Pain Descriptors / Indicators Other (Comment)   catching   Pain Type Chronic pain    Pain Onset 1 to 4 weeks ago    Pain Frequency Intermittent    Aggravating Factors  raising arm too high                [facility] PT Assessment - 06/03/21 1014       Assessment   Medical Diagnosis CVA    Referring Provider (PT) [name]    Onset Date/Surgical Date 01/21/21      Observation/Other Assessments    Focus on Therapeutic Outcomes (FOTO)  62      6 minute walk test results    Aerobic Endurance Distance Walked 450    Endurance additional comments Pt ambulated 2 min 45 sec before needing a seated break then another little but before needing to stop at 5 min 35 sec. 300' first bout and 150 the second for 450' total with cane with quad tip                           [facility] Adult PT Treatment/Exercise - 06/03/21 1014       Ambulation/Gait   Ambulation/Gait Yes    Ambulation/Gait Assistance 5: Supervision;4: Min guard    Ambulation/Gait Assistance Details during 6 min walk. LLE fatigued as went on.    Ambulation Distance (Feet) 450 Feet   divided in to 300 and 150'   Assistive device Straight cane   left foot-up brace   Gait Pattern Step-through pattern;Decreased stance time - left;Decreased weight shift to left    Ambulation Surface Level;Indoor      Standardized Balance Assessment   Standardized Balance Assessment Dynamic Gait Index      Dynamic Gait Index   Level Surface Mild Impairment    Change in Gait Speed Mild Impairment    Gait with Horizontal Head Turns Mild Impairment    Gait with Vertical Head Turns Mild Impairment    Gait and Pivot Turn Mild Impairment    Step Over Obstacle Mild Impairment    Step Around Obstacles Normal    Steps Mild Impairment    Total Score 17      Exercises   Exercises Other Exercises      Knee/Hip Exercises: Aerobic   Nustep x 5 min level 4 with BUE and BLE for improving activity tolerance and strengthening.                       PT Short Term Goals - 05/27/21 1032       PT SHORT TERM GOAL #1   Title Pt will increase Berg from 49/56 to >53/56 for improved balance and decreased fall risk.    Baseline 04/20/21 49/56 > decreased to 46/56    Time 4    Period Weeks    Status Not Met    Target Date 05/18/21      PT SHORT TERM GOAL #2   Title Pt will ambulate up/down curb and ramp with cane mod I for improved  community access.    Baseline Min A due to fatigue, weakness, impaired SLS  balance. 05/27/21 supervision on ramp and curb    Time 4    Period Weeks    Status Partially Met    Target Date 05/18/21      PT SHORT TERM GOAL #3   Title Pt will increase gait speed to >0.24ms with cane for improved community mobility.    Baseline 0.650m > declined slightly to .625 m/sec. 05/27/21 0.724mwith cane    Time 4    Period Weeks    Status Partially Met    Target Date 05/18/21               PT Long Term Goals - 06/03/21 1015       PT LONG TERM GOAL #1   Title Pt will be independent with progressive HEP for strengthening and balance to continue gains on own. (LTGs due 06/11/21)    Baseline 04/20/21 Pt has been instructed in HEP she is working on . PT continues to add and update. 06/03/21 Pt reports she is walking a little more outside if not too cold, denies any questions on her exercises.    Time 8    Period Weeks    Status Achieved    Target Date 06/11/21      PT LONG TERM GOAL #2   Title Pt will increase DGI from 14/24 to >19/24 for improved balance and gait safety.    Baseline 04/20/21 14/24. 06/03/21 17/24    Time 8    Period Weeks    Status Partially Met    Target Date 06/11/21      PT LONG TERM GOAL #3   Title Pt will ambulate up/down 4 steps with cane in reciprocal pattern for improved community access and functional strength.    Baseline 06/03/21 able to perform with rail in reciprocal pattern    Time 8    Period Weeks    Status Partially Met    Target Date 06/11/21      PT LONG TERM GOAL #4   Title Pt will ambulate >800' on varied surfaces with cane mod I for improved short community distances.    Baseline 11/1- pt ambulated 550' on varied surface w/ quad tip cane supervision.. 06/03/21 450' with cane on level surface    Time 8    Period Weeks    Status Not Met    Target Date 06/11/21      PT LONG TERM GOAL #5   Title Pt will increase  6 min walk distance to >800'  for improved activity tolerance and function.    Baseline 04/20/21 690'. 06/03/21 450' with 1 seated rest and stopping at 5 min 35 sec    Time 8    Period Weeks    Status Not Met    Target Date 06/11/21      PT LONG TERM GOAL #6   Title FOTO will be completed at d/c to get final measure on self perceived disability.    Baseline 02/16/21 50. 04/20/21 62. 06/03/21 62    Time 8    Period Weeks    Status On-going    Target Date 06/11/21            Updated PT goals:  PT Short Term Goals - 06/03/21 2006       PT SHORT TERM GOAL #1   Title Pt will increase Berg from 49/56 to >53/56 for improved balance and decreased fall risk.    Baseline 04/20/21 49/56 > decreased to 46/56    Time  4    Period Weeks    Status On-going    Target Date 07/01/21      PT SHORT TERM GOAL #2   Title Pt will ambulate up/down curb and ramp with cane mod I for improved community access.    Baseline 05/27/21 supervision on ramp and curb    Time 4    Period Weeks    Status On-going    Target Date 07/01/21      PT SHORT TERM GOAL #3   Title Pt will increase gait speed to >0.38ms with cane for improved community mobility.    Baseline 0.679m > declined slightly to .625 m/sec. 05/27/21 0.724mwith cane    Time 4    Period Weeks    Status On-going    Target Date 07/01/21             PT Long Term Goals - 06/03/21 2009       PT LONG TERM GOAL #1   Title Pt will ambulate >500' on varied level surfaces mod I with cane with quad tip for improved short community distances.(LTGs due 07/30/21)    Time 8    Period Weeks    Status New    Target Date 07/30/21      PT LONG TERM GOAL #2   Title Pt will increase DGI from 14/24 to >19/24 for improved balance and gait safety.    Baseline 04/20/21 14/24. 06/03/21 17/24    Time 8    Period Weeks    Status On-going    Target Date 07/30/21      PT LONG TERM GOAL #3   Title Pt will ambulate up/down 4 steps with cane in reciprocal pattern for improved community  access and functional strength.    Baseline 06/03/21 able to perform with rail in reciprocal pattern    Time 8    Period Weeks    Status On-going    Target Date 07/30/21      PT LONG TERM GOAL #4   Title Pt will be able to complete 6 min walk without resting for improved activity tolerance.    Baseline had to rest after 2 min 45 sec    Time 8    Period Weeks    Status New    Target Date 07/30/21      PT LONG TERM GOAL #5   Title FOTO will be completed at d/c    Baseline 06/03/21 62    Time 8    Period Weeks    Status New    Target Date 07/30/21                   Plan - 06/03/21 1957     Clinical Impression Statement PT checked LTGs today. Pt met HEP goal and improved 3 points on DGI to 17/24 but was just short of goal. She is still considered fall risk based on this score. Pt was not able to increase 6 min walk distance today when performed with cane. She reports not sleeping well last night and feeling more tired today. She partially met stair goal with being able to perform reciprocal pattern with rail and cane supervision. She is showing improving stability at left knee with gait but does fatigue quickly. Pt will benefit from continued PT to continue to work on strengthening LLE, improving activity tolerance and balance.    Personal Factors and Comorbidities Comorbidity 3+    Comorbidities arthritis, asthma, depression, HTN, neuropathy, suicide attempt,  DM    Examination-Activity Limitations Locomotion Level;Transfers;Stand;Stairs;Squat    Examination-Participation Restrictions Community Activity;Driving;Yard Work;Cleaning    Stability/Clinical Decision Making Evolving/Moderate complexity    Rehab Potential Good    PT Frequency 1x / week    PT Duration 8 weeks    PT Treatment/Interventions ADLs/Self Care Home Management;DME Instruction;Neuromuscular re-education;Manual techniques;Balance training;Therapeutic exercise;Therapeutic activities;Functional mobility  training;Stair training;Gait training;Vestibular;Passive range of motion;Orthotic Fit/Training;Patient/family education    PT Next Visit Plan continue LLE strengthening, SLS LLE, static and dynamic balance training. Gait/stair training with cane with quad tip. Curb/ramp training. Step-ups with LLE working on control. NuStep or SciFit to try to improve activity tolerance.    Consulted and Agree with Plan of Care Patient             Patient will benefit from skilled therapeutic intervention in order to improve the following deficits and impairments:  Abnormal gait, Decreased balance, Decreased activity tolerance, Decreased mobility, Decreased strength, Decreased knowledge of use of DME, Decreased endurance, Impaired sensation  Visit Diagnosis: Other abnormalities of gait and mobility  Muscle weakness (generalized)  Unsteadiness on feet     Problem List Patient Active Problem List   Diagnosis Date Noted   Encounter for loop recorder check 01/02/2021   Loop Biotronik loop implant 12/16/2020 12/16/2020   Leukopenia    Controlled type 2 diabetes mellitus with hyperglycemia, with long-term current use of insulin (HCC)    Vascular headache    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   Acute CVA (cerebrovascular accident) ([place]) 06/26/2020   CVA (cerebral vascular accident) ([place]) 06/25/2020   BMI 32.0-32.9,adult 06/03/2020   Left upper lobe pneumonia 03/30/2020   Chest pain 10/01/2019   MDD (major depressive disorder), recurrent severe, without psychosis ([place]) 05/09/2019    Suicide attempt ([place]) 05/09/2019   Cocaine abuse ([place]) 05/09/2019   Interstitial lung disease ([place]) 06/21/2016   COPD (chronic obstructive pulmonary disease) ([place]) 06/21/2016   Cough 06/21/2016   GERD (gastroesophageal reflux disease) 06/21/2016   Hypersomnia 06/21/2016   Tobacco user 06/21/2016   Rectal cancer ([place]) 01/30/2013    Electa Sniff, PT, DPT, NCS 06/03/2021, 8:06 PM  [facility] [address] [place], [place], [address] Phone: [phone]   Fax:  [phone]  Name: Erica [name] MRN: [id number] Date of Birth: 11/10/1955

## 2021-06-10 ENCOUNTER — Ambulatory Visit: Payer: Medicare HMO | Admitting: Physical Therapy

## 2021-06-17 ENCOUNTER — Ambulatory Visit: Payer: Medicare HMO | Admitting: Physical Therapy

## 2021-06-18 ENCOUNTER — Ambulatory Visit: Payer: Medicare HMO | Attending: Physical Medicine & Rehabilitation

## 2021-06-18 DIAGNOSIS — R2681 Unsteadiness on feet: Secondary | ICD-10-CM | POA: Insufficient documentation

## 2021-06-18 DIAGNOSIS — R2689 Other abnormalities of gait and mobility: Secondary | ICD-10-CM | POA: Insufficient documentation

## 2021-06-18 DIAGNOSIS — M6281 Muscle weakness (generalized): Secondary | ICD-10-CM | POA: Insufficient documentation

## 2021-06-24 ENCOUNTER — Encounter: Payer: Self-pay | Admitting: Physical Therapy

## 2021-06-24 ENCOUNTER — Other Ambulatory Visit: Payer: Self-pay

## 2021-06-24 ENCOUNTER — Ambulatory Visit: Payer: Medicare HMO | Admitting: Physical Therapy

## 2021-06-24 DIAGNOSIS — R2689 Other abnormalities of gait and mobility: Secondary | ICD-10-CM | POA: Diagnosis present

## 2021-06-24 DIAGNOSIS — M6281 Muscle weakness (generalized): Secondary | ICD-10-CM | POA: Diagnosis present

## 2021-06-24 DIAGNOSIS — R2681 Unsteadiness on feet: Secondary | ICD-10-CM | POA: Diagnosis present

## 2021-06-24 NOTE — Therapy (Signed)
[facility] [address] [place], [place], [address] Phone: [phone]   Fax:  [phone]  Physical Therapy Treatment  Patient Details  Name: Erica [name] MRN: [id number] Date of Birth: [birth year] Referring Provider (PT): [name]   Encounter Date: 06/24/2021   PT End of Session - 06/24/21 0939     Visit Number 22    Number of Visits 29    Date for PT Re-Evaluation 07/30/21    Authorization Type [employer]    Authorization Time Period 38 visists from 11/8-12/30/22    Authorization - Visit Number 10   visit count corrected this visit   Authorization - Number of Visits 16    Progress Note Due on Visit 47    PT Start Time 0933    PT Stop Time 1015    PT Time Calculation (min) 42 min    Equipment Utilized During Treatment Gait belt    Activity Tolerance Patient limited by fatigue;Patient tolerated treatment well    Behavior During Therapy [facility] for tasks assessed/performed             Past Medical History:  Diagnosis Date   Arthritis    especially in shoulders   Asthma    Depression    Diabetes mellitus    Embolic stroke ([place]) 6/96/2952   GERD (gastroesophageal reflux disease)    Headache(784.0)    "mild"   Hypertension    Loop Biotronik loop implant 12/16/2020 12/16/2020   Mental disorder    depression   Neuropathy    feet    Pain    arthritis pain - takes tramadol as needed   Rectal polyp    very little bleeding with bowel movements- no pain   Stroke ([place]) 11/03/2020   Suicide attempt [facility])     Past Surgical History:  Procedure Laterality Date   ANAL RECTAL MANOMETRY N/A 07/13/2016   Procedure: ANO RECTAL MANOMETRY;  Surgeon: Leighton Ruff, MD;  [facility];  Service: Endoscopy;  Laterality: N/A;   CESAREAN SECTION     EUS N/A 11/21/2012   Procedure: LOWER ENDOSCOPIC ULTRASOUND (EUS);  Surgeon: Arta Silence, MD;  Location: [facility];  Service: Endoscopy;  Laterality:  N/A;   FLEXIBLE SIGMOIDOSCOPY N/A 11/21/2012   Procedure: FLEXIBLE SIGMOIDOSCOPY;  Surgeon: Arta Silence, MD;  [facility];  Service: Endoscopy;  Laterality: N/A;   FLEXIBLE SIGMOIDOSCOPY N/A 01/28/2013   Procedure: FLEXIBLE SIGMOIDOSCOPY;  Surgeon: Leighton Ruff, MD;  [facility];  Service: Endoscopy;  Laterality: N/A;   LAPAROSCOPIC LOW ANTERIOR RESECTION N/A 01/29/2013   Procedure: LAPAROSCOPIC LOW ANTERIOR RESECTION, Rigid Proctoscopy;  Surgeon: Leighton Ruff, MD;  [facility];  Service: General;  Laterality: N/A;   LAPAROSCOPIC SIGMOID COLECTOMY N/A 11/14/2012   Procedure: DIAGNOSTIC LAPAROSCOPY AND SIGMOIDMOIDOSCOPY ;  Surgeon: Rolm Bookbinder, MD;  [facility];  Service: General;  Laterality: N/A;   RECTAL ULTRASOUND N/A 07/13/2016   Procedure: RECTAL ULTRASOUND;  Surgeon: Leighton Ruff, MD;  [facility];  Service: Endoscopy;  Laterality: N/A;   TONSILLECTOMY     TONSILLECTOMY AND ADENOIDECTOMY      There were no vitals filed for this visit.   Subjective Assessment - 06/24/21 0937     Subjective Had a fall 5 days ago when walking with cane inside. He left knee gave way causing her to fall to her knees. Did not hit her head. Was able to get herself up. Had been busy the day before and reports her  left leg was feeling tired that day however she wanted to try and push through her normal routine. Denies any injuries, was a little sore afterwards, not anymore.    Pertinent History arthritis, asthma, depression, HTN, neuropathy, suicide attempt, DM, CVA    Patient Stated Goals Pt would like to be able to walk and use her hands better.                    [facility] Adult PT Treatment/Exercise - 06/24/21 0940       Transfers   Transfers Sit to Stand;Stand to Sit    Sit to Stand 6: Modified independent (Device/Increase time)    Stand to Sit 6: Modified independent (Device/Increase time)      Ambulation/Gait   Ambulation/Gait Yes     Ambulation/Gait Assistance 5: Supervision;4: Min guard    Ambulation Distance (Feet) --   around clinic with session   Assistive device Straight cane   left foot up brace   Gait Pattern Step-through pattern;Decreased stance time - left;Decreased weight shift to left    Ambulation Surface Level;Indoor      Neuro Re-ed    Neuro Re-ed Details  for strengthening/muscle re-ed/balance: standing at bottom of stairs with bil UE support- left stance for right foot taps up/down bottom 2 steps for 10 reps with emphasis on hip/knee flexion/not sliding foot, then right stance with emphasis on right stance knee control for left foot taps up/down      Knee/Hip Exercises: Aerobic   Other Aerobic Scifit level 4.0 x 8 minutes with UE/LE's with goal >/= 60 steps per minute for strenthening and activity tolerance.                 Balance Exercises - 06/24/21 1003       Balance Exercises: Standing   Standing Eyes Closed Foam/compliant surface;Narrow base of support (BOS);Wide (BOA);Head turns;Other reps (comment);30 secs;Limitations    Standing Eyes Closed Limitations on airex with no UE support: feet together for EC 30 seconds x 3 reps, then with feet hip width apart for EC head movements left<>right, up<>down for ~10 reps each with min guard to min assist for balance.                  PT Short Term Goals - 06/03/21 2006       PT SHORT TERM GOAL #1   Title Pt will increase Berg from 49/56 to >53/56 for improved balance and decreased fall risk.    Baseline 04/20/21 49/56 > decreased to 46/56    Time 4    Period Weeks    Status On-going    Target Date 07/01/21      PT SHORT TERM GOAL #2   Title Pt will ambulate up/down curb and ramp with cane mod I for improved community access.    Baseline 05/27/21 supervision on ramp and curb    Time 4    Period Weeks    Status On-going    Target Date 07/01/21      PT SHORT TERM GOAL #3   Title Pt will increase gait speed to >0.55ms with cane for  improved community mobility.    Baseline 0.627m > declined slightly to .625 m/sec. 05/27/21 0.7266mwith cane    Time 4    Period Weeks    Status On-going    Target Date 07/01/21               PT Long Term Goals - 06/03/21  2009       PT LONG TERM GOAL #1   Title Pt will ambulate >500' on varied level surfaces mod I with cane with quad tip for improved short community distances.(LTGs due 07/30/21)    Time 8    Period Weeks    Status New    Target Date 07/30/21      PT LONG TERM GOAL #2   Title Pt will increase DGI from 14/24 to >19/24 for improved balance and gait safety.    Baseline 04/20/21 14/24. 06/03/21 17/24    Time 8    Period Weeks    Status On-going    Target Date 07/30/21      PT LONG TERM GOAL #3   Title Pt will ambulate up/down 4 steps with cane in reciprocal pattern for improved community access and functional strength.    Baseline 06/03/21 able to perform with rail in reciprocal pattern    Time 8    Period Weeks    Status On-going    Target Date 07/30/21      PT LONG TERM GOAL #4   Title Pt will be able to complete 6 min walk without resting for improved activity tolerance.    Baseline had to rest after 2 min 45 sec    Time 8    Period Weeks    Status New    Target Date 07/30/21      PT LONG TERM GOAL #5   Title FOTO will be completed at d/c    Baseline 06/03/21 62    Time 8    Period Weeks    Status New    Target Date 07/30/21                   Plan - 06/24/21 0940     Clinical Impression Statement Today's skilled session continued to address strengthening, activity tolerance and balance training with rest breaks taken as needed due to LE fatigue. No other issues were noted or repoted in session. The pt is making steady progress toward goals and should benefit from continued PT to progress toward unmet goals.    Personal Factors and Comorbidities Comorbidity 3+    Comorbidities arthritis, asthma, depression, HTN, neuropathy, suicide  attempt, DM    Examination-Activity Limitations Locomotion Level;Transfers;Stand;Stairs;Squat    Examination-Participation Restrictions Community Activity;Driving;Yard Work;Cleaning    Stability/Clinical Decision Making Evolving/Moderate complexity    Rehab Potential Good    PT Frequency 1x / week    PT Duration 8 weeks    PT Treatment/Interventions ADLs/Self Care Home Management;DME Instruction;Neuromuscular re-education;Manual techniques;Balance training;Therapeutic exercise;Therapeutic activities;Functional mobility training;Stair training;Gait training;Vestibular;Passive range of motion;Orthotic Fit/Training;Patient/family education    PT Next Visit Plan continue LLE strengthening, SLS LLE, static and dynamic balance training. Gait/stair training with cane with quad tip. Curb/ramp training. Step-ups with LLE working on control. NuStep or SciFit to try to improve activity tolerance.    Consulted and Agree with Plan of Care Patient             Patient will benefit from skilled therapeutic intervention in order to improve the following deficits and impairments:  Abnormal gait, Decreased balance, Decreased activity tolerance, Decreased mobility, Decreased strength, Decreased knowledge of use of DME, Decreased endurance, Impaired sensation  Visit Diagnosis: Other abnormalities of gait and mobility  Muscle weakness (generalized)  Unsteadiness on feet     Problem List Patient Active Problem List   Diagnosis Date Noted   Encounter for loop recorder check 01/02/2021   Loop  Biotronik loop implant 12/16/2020 12/16/2020   Leukopenia    Controlled type 2 diabetes mellitus with hyperglycemia, with long-term current use of insulin (HCC)    Vascular headache    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[place]) 34/19/6222   Embolic stroke ([place]) 97/98/9211   Sinus bradycardia on ECG 11/03/2020   Benign essential HTN    Pancytopenia (HCC)    Stage 3b chronic kidney disease ([place])    Anemia    Labile blood  glucose    Labile blood pressure    Diabetic peripheral neuropathy ([place])    Essential hypertension    AKI (acute kidney injury) ([place])    Ataxia, late effect of cerebrovascular disease    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[place]) 06/30/2020   Acute CVA (cerebrovascular accident) ([place]) 06/26/2020   CVA (cerebral vascular accident) ([place]) 06/25/2020   BMI 32.0-32.9,adult 06/03/2020   Left upper lobe pneumonia 03/30/2020   Chest pain 10/01/2019   MDD (major depressive disorder), recurrent severe, without psychosis ([place]) 05/09/2019   Suicide attempt ([place]) 05/09/2019   Cocaine abuse ([place]) 05/09/2019   Interstitial lung disease ([place]) 06/21/2016   COPD (chronic obstructive pulmonary disease) ([place]) 06/21/2016   Cough 06/21/2016   GERD (gastroesophageal reflux disease) 06/21/2016   Hypersomnia 06/21/2016   Tobacco user 06/21/2016   Rectal cancer ([place]) 01/30/2013    Willow Ora, PTA, [facility] [address] [place], [place] [address] [phone] 06/24/21, 7:24 PM   Name: Erica [name] MRN: [id number] Date of Birth: June 21, 1955

## 2021-07-01 ENCOUNTER — Ambulatory Visit: Payer: Medicare HMO | Admitting: Physical Therapy

## 2021-07-01 ENCOUNTER — Encounter: Payer: Self-pay | Admitting: Physical Therapy

## 2021-07-01 ENCOUNTER — Other Ambulatory Visit: Payer: Self-pay

## 2021-07-01 DIAGNOSIS — R2681 Unsteadiness on feet: Secondary | ICD-10-CM

## 2021-07-01 DIAGNOSIS — R2689 Other abnormalities of gait and mobility: Secondary | ICD-10-CM

## 2021-07-01 DIAGNOSIS — M6281 Muscle weakness (generalized): Secondary | ICD-10-CM

## 2021-07-01 NOTE — Therapy (Signed)
[facility] [address] [place], [place], [address] Phone: [phone]   Fax:  [phone]  Physical Therapy Treatment  Patient Details  Name: Erica [name] MRN: [id number] Date of Birth: 1955/08/16 Referring Provider (PT): [name]   Encounter Date: 07/01/2021   PT End of Session - 07/01/21 1021     Visit Number 23    Number of Visits 29    Date for PT Re-Evaluation 07/30/21    Authorization Type [employer]    Authorization Time Period 73 visists from 11/8-12/30/22; new approval from 12/29-2/17/23- just need visit limit- message has been sent    Authorization - Visit Number 2   visit count corrected this visit   Authorization - Number of Visits --    Progress Note Due on Visit 20    PT Start Time 1018    PT Stop Time 1058    PT Time Calculation (min) 40 min    Equipment Utilized During Treatment Gait belt    Activity Tolerance Patient tolerated treatment well    Behavior During Therapy [facility] for tasks assessed/performed             Past Medical History:  Diagnosis Date   Arthritis    especially in shoulders   Asthma    Depression    Diabetes mellitus    Embolic stroke ([place]) 3/82/5053   GERD (gastroesophageal reflux disease)    Headache(784.0)    "mild"   Hypertension    Loop Biotronik loop implant 12/16/2020 12/16/2020   Mental disorder    depression   Neuropathy    feet    Pain    arthritis pain - takes tramadol as needed   Rectal polyp    very little bleeding with bowel movements- no pain   Stroke ([place]) 11/03/2020   Suicide attempt [facility])     Past Surgical History:  Procedure Laterality Date   ANAL RECTAL MANOMETRY N/A 07/13/2016   Procedure: ANO RECTAL MANOMETRY;  Surgeon: Leighton Ruff, MD;  [facility];  Service: Endoscopy;  Laterality: N/A;   CESAREAN SECTION     EUS N/A 11/21/2012   Procedure: LOWER ENDOSCOPIC ULTRASOUND (EUS);  Surgeon: Arta Silence, MD;   [facility];  Service: Endoscopy;  Laterality: N/A;   FLEXIBLE SIGMOIDOSCOPY N/A 11/21/2012   Procedure: FLEXIBLE SIGMOIDOSCOPY;  Surgeon: Arta Silence, MD;  [facility];  Service: Endoscopy;  Laterality: N/A;   FLEXIBLE SIGMOIDOSCOPY N/A 01/28/2013   Procedure: FLEXIBLE SIGMOIDOSCOPY;  Surgeon: Leighton Ruff, MD;  [facility];  Service: Endoscopy;  Laterality: N/A;   LAPAROSCOPIC LOW ANTERIOR RESECTION N/A 01/29/2013   Procedure: LAPAROSCOPIC LOW ANTERIOR RESECTION, Rigid Proctoscopy;  Surgeon: Leighton Ruff, MD;  [facility];  Service: General;  Laterality: N/A;   LAPAROSCOPIC SIGMOID COLECTOMY N/A 11/14/2012   Procedure: DIAGNOSTIC LAPAROSCOPY AND SIGMOIDMOIDOSCOPY ;  Surgeon: Rolm Bookbinder, MD;  [facility];  Service: General;  Laterality: N/A;   RECTAL ULTRASOUND N/A 07/13/2016   Procedure: RECTAL ULTRASOUND;  Surgeon: Leighton Ruff, MD;  [facility];  Service: Endoscopy;  Laterality: N/A;   TONSILLECTOMY     TONSILLECTOMY AND ADENOIDECTOMY      There were no vitals filed for this visit.   Subjective Assessment - 07/01/21 1020     Subjective No new complaitns. No falls or pain to report. Felt okay after last session.    Pertinent History arthritis, asthma, depression, HTN, neuropathy, suicide attempt, DM, CVA    Patient Stated Goals  Pt would like to be able to walk and use her hands better.    Currently in Pain? No/denies    Pain Score 0-No pain                    [facility] Adult PT Treatment/Exercise - 07/01/21 1022       Transfers   Transfers Sit to Stand;Stand to Sit    Sit to Stand 6: Modified independent (Device/Increase time)    Stand to Sit 6: Modified independent (Device/Increase time)      Ambulation/Gait   Ambulation/Gait Yes    Ambulation/Gait Assistance 5: Supervision;4: Min guard    Ambulation/Gait Assistance Details cues needed for increased left step length and foot clearace even with foot up brace  on. increased assistance as pt fatigued with increased distance.    Ambulation Distance (Feet) 210 Feet   x2, plus around clinic with session   Assistive device Straight cane    Gait Pattern Step-through pattern;Decreased stance time - left;Decreased weight shift to left    Ambulation Surface Level;Indoor      Knee/Hip Exercises: Aerobic   Other Aerobic Scifit level 4.0 x 8 minutes with UE/LE's with goal >/= 60 steps per minute for strenthening and activity tolerance.                 Balance Exercises - 07/01/21 1052       Balance Exercises: Standing   Standing Eyes Closed Wide (BOA);Foam/compliant surface;3 reps;30 secs;Limitations;Narrow base of support (BOS);Head turns;Other reps (comment)    Standing Eyes Closed Limitations standing across blue foam beam: feet hip width apart for EC 30 seconds x 3 reps; then on airex with feet together for EC head movements left<>right, up<>down for ~10 reps. min guard to min assist with cues on posture/weight shifting to assist with balance.    Balance Beam standing across blue foam beam with light UE support: alternating forward stepping to floor/back onto beam, then alternating backward stepping to floor/back onto beam for ~10 reps each/each way. cues for increased step length/height and weight shifting. min guard assist for safety.    Sidestepping Foam/compliant support;Upper extremity support;3 reps;Limitations    Sidestepping Limitations on blue foam beam: 4 laps each way with light support on bars, cues on posture and step length/height.                  PT Short Term Goals - 06/03/21 2006       PT SHORT TERM GOAL #1   Title Pt will increase Berg from 49/56 to >53/56 for improved balance and decreased fall risk.    Baseline 04/20/21 49/56 > decreased to 46/56    Time 4    Period Weeks    Status On-going    Target Date 07/01/21      PT SHORT TERM GOAL #2   Title Pt will ambulate up/down curb and ramp with cane mod I for  improved community access.    Baseline 05/27/21 supervision on ramp and curb    Time 4    Period Weeks    Status On-going    Target Date 07/01/21      PT SHORT TERM GOAL #3   Title Pt will increase gait speed to >0.25ms with cane for improved community mobility.    Baseline 0.656m > declined slightly to .625 m/sec. 05/27/21 0.7267mwith cane    Time 4    Period Weeks    Status On-going    Target Date 07/01/21  PT Long Term Goals - 06/03/21 2009       PT LONG TERM GOAL #1   Title Pt will ambulate >500' on varied level surfaces mod I with cane with quad tip for improved short community distances.(LTGs due 07/30/21)    Time 8    Period Weeks    Status New    Target Date 07/30/21      PT LONG TERM GOAL #2   Title Pt will increase DGI from 14/24 to >19/24 for improved balance and gait safety.    Baseline 04/20/21 14/24. 06/03/21 17/24    Time 8    Period Weeks    Status On-going    Target Date 07/30/21      PT LONG TERM GOAL #3   Title Pt will ambulate up/down 4 steps with cane in reciprocal pattern for improved community access and functional strength.    Baseline 06/03/21 able to perform with rail in reciprocal pattern    Time 8    Period Weeks    Status On-going    Target Date 07/30/21      PT LONG TERM GOAL #4   Title Pt will be able to complete 6 min walk without resting for improved activity tolerance.    Baseline had to rest after 2 min 45 sec    Time 8    Period Weeks    Status New    Target Date 07/30/21      PT LONG TERM GOAL #5   Title FOTO will be completed at d/c    Baseline 06/03/21 62    Time 8    Period Weeks    Status New    Target Date 07/30/21                   Plan - 07/01/21 1021     Clinical Impression Statement Today's skilled session continued to address strengthening, balance training and gait with straight cane with rubber quad tip. Pt with increased episodes of left foot scuffing today despite foot up brace  being on with some balance challenges noted that pt self corrected with min guard assist for safety. No other issues noted or reported in session. The pt is making steady progress and should benefit from continued PT to progress toward unmet goals.    Personal Factors and Comorbidities Comorbidity 3+    Comorbidities arthritis, asthma, depression, HTN, neuropathy, suicide attempt, DM    Examination-Activity Limitations Locomotion Level;Transfers;Stand;Stairs;Squat    Examination-Participation Restrictions Community Activity;Driving;Yard Work;Cleaning    Stability/Clinical Decision Making Evolving/Moderate complexity    Rehab Potential Good    PT Frequency 1x / week    PT Duration 8 weeks    PT Treatment/Interventions ADLs/Self Care Home Management;DME Instruction;Neuromuscular re-education;Manual techniques;Balance training;Therapeutic exercise;Therapeutic activities;Functional mobility training;Stair training;Gait training;Vestibular;Passive range of motion;Orthotic Fit/Training;Patient/family education    PT Next Visit Plan continue LLE strengthening, SLS LLE, static and dynamic balance training. Gait/stair training with cane with quad tip. Curb/ramp training. Step-ups with LLE working on control. NuStep or SciFit to try to improve activity tolerance.    Consulted and Agree with Plan of Care Patient             Patient will benefit from skilled therapeutic intervention in order to improve the following deficits and impairments:  Abnormal gait, Decreased balance, Decreased activity tolerance, Decreased mobility, Decreased strength, Decreased knowledge of use of DME, Decreased endurance, Impaired sensation  Visit Diagnosis: Other abnormalities of gait and mobility  Muscle weakness (generalized)  Unsteadiness on feet     Problem List Patient Active Problem List   Diagnosis Date Noted   Encounter for loop recorder check 01/02/2021   Loop Biotronik loop implant 12/16/2020 12/16/2020    Leukopenia    Controlled type 2 diabetes mellitus with hyperglycemia, with long-term current use of insulin (HCC)    Vascular headache    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[place]) 21/94/7125   Embolic stroke ([place]) 27/05/9289   Sinus bradycardia on ECG 11/03/2020   Benign essential HTN    Pancytopenia (HCC)    Stage 3b chronic kidney disease ([place])    Anemia    Labile blood glucose    Labile blood pressure    Diabetic peripheral neuropathy ([place])    Essential hypertension    AKI (acute kidney injury) ([place])    Ataxia, late effect of cerebrovascular disease    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[place]) 06/30/2020   Acute CVA (cerebrovascular accident) ([place]) 06/26/2020   CVA (cerebral vascular accident) ([place]) 06/25/2020   BMI 32.0-32.9,adult 06/03/2020   Left upper lobe pneumonia 03/30/2020   Chest pain 10/01/2019   MDD (major depressive disorder), recurrent severe, without psychosis ([place]) 05/09/2019   Suicide attempt ([place]) 05/09/2019   Cocaine abuse ([place]) 05/09/2019   Interstitial lung disease ([place]) 06/21/2016   COPD (chronic obstructive pulmonary disease) ([name]) 06/21/2016   Cough 06/21/2016   GERD (gastroesophageal reflux disease) 06/21/2016   Hypersomnia 06/21/2016   Tobacco user 06/21/2016   Rectal cancer ([place]) 01/30/2013    Willow Ora, PTA, [facility] [address] [place], [place] [address] [phone] 07/01/21, 10:19 PM   Name: Erica [name] MRN: [id number] Date of Birth: [birth year]

## 2021-07-07 ENCOUNTER — Ambulatory Visit: Payer: Medicare HMO

## 2021-07-15 ENCOUNTER — Ambulatory Visit: Payer: Medicare HMO | Attending: Physical Medicine & Rehabilitation | Admitting: Physical Therapy

## 2021-07-15 ENCOUNTER — Other Ambulatory Visit: Payer: Self-pay

## 2021-07-15 ENCOUNTER — Encounter: Payer: Self-pay | Admitting: Physical Therapy

## 2021-07-15 VITALS — BP 180/92

## 2021-07-15 DIAGNOSIS — R2689 Other abnormalities of gait and mobility: Secondary | ICD-10-CM

## 2021-07-15 DIAGNOSIS — R2681 Unsteadiness on feet: Secondary | ICD-10-CM | POA: Insufficient documentation

## 2021-07-15 DIAGNOSIS — M6281 Muscle weakness (generalized): Secondary | ICD-10-CM | POA: Diagnosis present

## 2021-07-15 NOTE — Therapy (Signed)
[facility] [address] [place], [place], [address] Phone: [phone]   Fax:  [phone]  Physical Therapy Treatment  Patient Details  Name: Erica [name] MRN: [id number] Date of Birth: 12/20/1955 Referring Provider (PT): [name]   Encounter Date: 07/15/2021   PT End of Session - 07/15/21 1336     Visit Number 24    Number of Visits 29    Date for PT Re-Evaluation 07/30/21    Authorization Type [employer]    Authorization Time Period 44 visists from 11/8-12/30/22; new approval from 12/29-2/17/23- just need visit limit- message has been sent    Authorization - Visit Number 3    Progress Note Due on Visit 20    PT Start Time 1017    PT Stop Time 1059    PT Time Calculation (min) 42 min    Equipment Utilized During Treatment Gait belt    Activity Tolerance Patient tolerated treatment well;Patient limited by fatigue    Behavior During Therapy [facility] for tasks assessed/performed             Past Medical History:  Diagnosis Date   Arthritis    especially in shoulders   Asthma    Depression    Diabetes mellitus    Embolic stroke ([place]) 9/70/2637   GERD (gastroesophageal reflux disease)    Headache(784.0)    "mild"   Hypertension    Loop Biotronik loop implant 12/16/2020 12/16/2020   Mental disorder    depression   Neuropathy    feet    Pain    arthritis pain - takes tramadol as needed   Rectal polyp    very little bleeding with bowel movements- no pain   Stroke ([place]) 11/03/2020   Suicide attempt [facility])     Past Surgical History:  Procedure Laterality Date   ANAL RECTAL MANOMETRY N/A 07/13/2016   Procedure: ANO RECTAL MANOMETRY;  Surgeon: Leighton Ruff, MD;  [facility];  Service: Endoscopy;  Laterality: N/A;   CESAREAN SECTION     EUS N/A 11/21/2012   Procedure: LOWER ENDOSCOPIC ULTRASOUND (EUS);  Surgeon: Arta Silence, MD;  Location[facility];  Service: Endoscopy;   Laterality: N/A;   FLEXIBLE SIGMOIDOSCOPY N/A 11/21/2012   Procedure: FLEXIBLE SIGMOIDOSCOPY;  Surgeon: Arta Silence, MD;  [facility];  Service: Endoscopy;  Laterality: N/A;   FLEXIBLE SIGMOIDOSCOPY N/A 01/28/2013   Procedure: FLEXIBLE SIGMOIDOSCOPY;  Surgeon: Leighton Ruff, MD;  [facility];  Service: Endoscopy;  Laterality: N/A;   LAPAROSCOPIC LOW ANTERIOR RESECTION N/A 01/29/2013   Procedure: LAPAROSCOPIC LOW ANTERIOR RESECTION, Rigid Proctoscopy;  Surgeon: Leighton Ruff, MD;  [facility];  Service: General;  Laterality: N/A;   LAPAROSCOPIC SIGMOID COLECTOMY N/A 11/14/2012   Procedure: DIAGNOSTIC LAPAROSCOPY AND SIGMOIDMOIDOSCOPY ;  Surgeon: Rolm Bookbinder, MD;  [facility];  Service: General;  Laterality: N/A;   RECTAL ULTRASOUND N/A 07/13/2016   Procedure: RECTAL ULTRASOUND;  Surgeon: Leighton Ruff, MD;  [facility];  Service: Endoscopy;  Laterality: N/A;   TONSILLECTOMY     TONSILLECTOMY AND ADENOIDECTOMY      Vitals:   07/15/21 1021 07/15/21 1032 07/15/21 1049  BP: (!) 184/96 (!) 170/80 (!) 180/92     Subjective Assessment - 07/15/21 1021     Subjective Pt reporting feeling her leg and hand on left side feel weaker, have for the past 4-5 days, and generally has not felt well. Hoping she has not had another small stoke. Has appt with  PCP tomorrow.    Pertinent History arthritis, asthma, depression, HTN, neuropathy, suicide attempt, DM, CVA    Patient Stated Goals Pt would like to be able to walk and use her hands better.    Currently in Pain? No/denies    Pain Score 0-No pain                   [facility] Adult PT Treatment/Exercise - 07/15/21 1034       Transfers   Transfers Sit to Stand;Stand to Sit    Sit to Stand 6: Modified independent (Device/Increase time)    Stand to Sit 6: Modified independent (Device/Increase time)      Ambulation/Gait   Ambulation/Gait Yes    Ambulation/Gait Assistance 5: Supervision;4: Min guard     Ambulation/Gait Assistance Details use of walker to enter/exit session. use of cane around clinic with session with occasional left toe scuffing noted. trialed use of simulated toe cap with gait for 2cd rep of gait with no toe scuffing noted.    Ambulation Distance (Feet) 50 Feet   x2   Assistive device Straight cane    Gait Pattern Step-through pattern;Decreased stance time - left;Decreased weight shift to left    Ambulation Surface Level;Indoor      Self-Care   Self-Care Other Self-Care Comments    Other Self-Care Comments  reviewed signs and symptoms of CVA and risk factors with BP being elevated today and pt reporting feeling more tired/weaker for past 4-5 days. Pt does have appt with PCP tomorrow.      Knee/Hip Exercises: Aerobic   Other Aerobic Scifit level 4.0 x 8 minutes with UE/LE's with goal >/= 60 steps per minute for strenthening and activity tolerance.                  PT Short Term Goals - 06/03/21 2006       PT SHORT TERM GOAL #1   Title Pt will increase Berg from 49/56 to >53/56 for improved balance and decreased fall risk.    Baseline 04/20/21 49/56 > decreased to 46/56    Time 4    Period Weeks    Status On-going    Target Date 07/01/21      PT SHORT TERM GOAL #2   Title Pt will ambulate up/down curb and ramp with cane mod I for improved community access.    Baseline 05/27/21 supervision on ramp and curb    Time 4    Period Weeks    Status On-going    Target Date 07/01/21      PT SHORT TERM GOAL #3   Title Pt will increase gait speed to >0.38ms with cane for improved community mobility.    Baseline 0.664m > declined slightly to .625 m/sec. 05/27/21 0.7277mwith cane    Time 4    Period Weeks    Status On-going    Target Date 07/01/21               PT Long Term Goals - 06/03/21 2009       PT LONG TERM GOAL #1   Title Pt will ambulate >500' on varied level surfaces mod I with cane with quad tip for improved short community  distances.(LTGs due 07/30/21)    Time 8    Period Weeks    Status New    Target Date 07/30/21      PT LONG TERM GOAL #2   Title Pt will increase DGI from 14/24 to >19/24 for  improved balance and gait safety.    Baseline 04/20/21 14/24. 06/03/21 17/24    Time 8    Period Weeks    Status On-going    Target Date 07/30/21      PT LONG TERM GOAL #3   Title Pt will ambulate up/down 4 steps with cane in reciprocal pattern for improved community access and functional strength.    Baseline 06/03/21 able to perform with rail in reciprocal pattern    Time 8    Period Weeks    Status On-going    Target Date 07/30/21      PT LONG TERM GOAL #4   Title Pt will be able to complete 6 min walk without resting for improved activity tolerance.    Baseline had to rest after 2 min 45 sec    Time 8    Period Weeks    Status New    Target Date 07/30/21      PT LONG TERM GOAL #5   Title FOTO will be completed at d/c    Baseline 06/03/21 62    Time 8    Period Weeks    Status New    Target Date 07/30/21                   Plan - 07/15/21 1337     Clinical Impression Statement Today's skilled session was limited due to elevated BP readings and pt generally feeling bad. Was able to continue to work on strengthening and gait training with cane. Pt's gait mechanics and balance improved with use of simulated toe cap in session today. The pt is progressing and should benefit from continued PT to progress toward unmet goals.    Personal Factors and Comorbidities Comorbidity 3+    Comorbidities arthritis, asthma, depression, HTN, neuropathy, suicide attempt, DM    Examination-Activity Limitations Locomotion Level;Transfers;Stand;Stairs;Squat    Examination-Participation Restrictions Community Activity;Driving;Yard Work;Cleaning    Stability/Clinical Decision Making Evolving/Moderate complexity    Rehab Potential Good    PT Frequency 1x / week    PT Duration 8 weeks    PT  Treatment/Interventions ADLs/Self Care Home Management;DME Instruction;Neuromuscular re-education;Manual techniques;Balance training;Therapeutic exercise;Therapeutic activities;Functional mobility training;Stair training;Gait training;Vestibular;Passive range of motion;Orthotic Fit/Training;Patient/family education    PT Next Visit Plan continue LLE strengthening, SLS LLE, static and dynamic balance training. Gait/stair training with cane with quad tip. Curb/ramp training. Step-ups with LLE working on control. NuStep or SciFit to try to improve activity tolerance.    Consulted and Agree with Plan of Care Patient             Patient will benefit from skilled therapeutic intervention in order to improve the following deficits and impairments:  Abnormal gait, Decreased balance, Decreased activity tolerance, Decreased mobility, Decreased strength, Decreased knowledge of use of DME, Decreased endurance, Impaired sensation  Visit Diagnosis: Other abnormalities of gait and mobility  Muscle weakness (generalized)  Unsteadiness on feet     Problem List Patient Active Problem List   Diagnosis Date Noted   Encounter for loop recorder check 01/02/2021   Loop Biotronik loop implant 12/16/2020 12/16/2020   Leukopenia    Controlled type 2 diabetes mellitus with hyperglycemia, with long-term current use of insulin (HCC)    Vascular headache    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[place]) 34/74/2595   Embolic stroke ([place]) 63/87/5643   Sinus bradycardia on ECG 11/03/2020   Benign essential HTN    Pancytopenia ([place])    Stage 3b chronic kidney disease ([place])  Anemia    Labile blood glucose    Labile blood pressure    Diabetic peripheral neuropathy (HCC)    Essential hypertension    AKI (acute kidney injury) ([place])    Ataxia, late effect of cerebrovascular disease    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[place]) 06/30/2020   Acute CVA (cerebrovascular accident) ([place]) 06/26/2020   CVA (cerebral vascular accident) ([place])  06/25/2020   BMI 32.0-32.9,adult 06/03/2020   Left upper lobe pneumonia 03/30/2020   Chest pain 10/01/2019   MDD (major depressive disorder), recurrent severe, without psychosis ([place]) 05/09/2019   Suicide attempt ([place]) 05/09/2019   Cocaine abuse ([place]) 05/09/2019   Interstitial lung disease ([place]) 06/21/2016   COPD (chronic obstructive pulmonary disease) ([place]) 06/21/2016   Cough 06/21/2016   GERD (gastroesophageal reflux disease) 06/21/2016   Hypersomnia 06/21/2016   Tobacco user 06/21/2016   Rectal cancer ([place]) 01/30/2013    Willow Ora, PTA, [facility] [address] [place], [place] [address] [phone] 07/15/21, 4:26 PM   Name: ARIHANA AMBROCIO MRN: [id number] Date of Birth: 09/14/1955

## 2021-07-16 ENCOUNTER — Other Ambulatory Visit: Payer: Self-pay | Admitting: Internal Medicine

## 2021-07-17 LAB — CBC
HCT: 29.7 % — ABNORMAL LOW (ref 35.0–45.0)
Hemoglobin: 10.5 g/dL — ABNORMAL LOW (ref 11.7–15.5)
MCH: 32.2 pg (ref 27.0–33.0)
MCHC: 35.4 g/dL (ref 32.0–36.0)
MCV: 91.1 fL (ref 80.0–100.0)
MPV: 10.3 fL (ref 7.5–12.5)
Platelets: 181 10*3/uL (ref 140–400)
RBC: 3.26 10*6/uL — ABNORMAL LOW (ref 3.80–5.10)
RDW: 14.5 % (ref 11.0–15.0)
WBC: 4.5 10*3/uL (ref 3.8–10.8)

## 2021-07-17 LAB — LIPID PANEL
Cholesterol: 128 mg/dL (ref <200)
HDL: 82 mg/dL (ref >=50)
LDL Cholesterol (Calc): 31 mg/dL (calc)
Non-HDL Cholesterol (Calc): 46 mg/dL (calc) (ref <130)
Total CHOL/HDL Ratio: 1.6 (calc) (ref <5.0)
Triglycerides: 71 mg/dL (ref <150)

## 2021-07-17 LAB — COMPLETE METABOLIC PANEL WITH GFR
AG Ratio: 1.3 (calc) (ref 1.0–2.5)
ALT: 18 U/L (ref 6–29)
AST: 27 U/L (ref 10–35)
Albumin: 4 g/dL (ref 3.6–5.1)
Alkaline phosphatase (APISO): 57 U/L (ref 37–153)
BUN/Creatinine Ratio: 14 (calc) (ref 6–22)
BUN: 23 mg/dL (ref 7–25)
CO2: 24 mmol/L (ref 20–32)
Calcium: 9.1 mg/dL (ref 8.6–10.4)
Chloride: 107 mmol/L (ref 98–110)
Creat: 1.68 mg/dL — ABNORMAL HIGH (ref 0.50–1.05)
Globulin: 3 g/dL (calc) (ref 1.9–3.7)
Glucose, Bld: 240 mg/dL — ABNORMAL HIGH (ref 65–99)
Potassium: 4.3 mmol/L (ref 3.5–5.3)
Sodium: 138 mmol/L (ref 135–146)
Total Bilirubin: 0.6 mg/dL (ref 0.2–1.2)
Total Protein: 7 g/dL (ref 6.1–8.1)
eGFR: 33 mL/min/{1.73_m2} — ABNORMAL LOW (ref >=60)

## 2021-07-17 LAB — FOLATE: Folate: 17.3 ng/mL

## 2021-07-17 LAB — VITAMIN D 25 HYDROXY (VIT D DEFICIENCY, FRACTURES): Vit D, 25-Hydroxy: 29 ng/mL — ABNORMAL LOW (ref 30–100)

## 2021-07-17 LAB — VITAMIN B12: Vitamin B-12: 842 pg/mL (ref 200–1100)

## 2021-07-17 LAB — TSH: TSH: 2.01 mIU/L (ref 0.40–4.50)

## 2021-07-21 ENCOUNTER — Other Ambulatory Visit: Payer: Self-pay

## 2021-07-21 ENCOUNTER — Ambulatory Visit: Payer: Medicare HMO

## 2021-07-21 ENCOUNTER — Telehealth: Payer: Self-pay

## 2021-07-21 VITALS — BP 142/88 | HR 88

## 2021-07-21 DIAGNOSIS — M6281 Muscle weakness (generalized): Secondary | ICD-10-CM

## 2021-07-21 DIAGNOSIS — R2689 Other abnormalities of gait and mobility: Secondary | ICD-10-CM

## 2021-07-21 NOTE — Telephone Encounter (Signed)
Dr. Jeanie Cooks, Erica [name] has been very limited in her ability to participate in therapy the past month. Today she could not even perform brief standing tasks due to reports of severe fatigue. Vitals were fine at visit. She states the last week she has been feeling worse and weaker on left. I know she is not sleeping well and just saw you on Friday. I have asked her to follow-up with you on her blood work results to see if there is anything else you recommend. If this continues we may have to hold on PT at this time until her energy level can improve some to allow her to participate. Cherly Anderson, PT, DPT, NCS

## 2021-07-21 NOTE — Therapy (Signed)
[facility] [address] [place], [place], [address] Phone: [phone]   Fax:  [phone]  Physical Therapy Treatment  Patient Details  Name: RAYLIE MADDISON MRN: [id number] Date of Birth: 02-04-1956 Referring Provider (PT): [name]   Encounter Date: 07/21/2021   PT End of Session - 07/21/21 1016     Visit Number 25    Number of Visits 29    Date for PT Re-Evaluation 07/30/21    Authorization Type [employer]    Authorization Time Period 34 visists from 11/8-12/30/22; new approval from 12/29-2/17/23- just need visit limit- message has been sent    Authorization - Visit Number 3    Progress Note Due on Visit 37    PT Start Time 1014    PT Stop Time 1040   Pt too tired to continue   PT Time Calculation (min) 26 min    Equipment Utilized During Treatment Gait belt    Activity Tolerance Patient limited by fatigue    Behavior During Therapy [facility] for tasks assessed/performed             Past Medical History:  Diagnosis Date   Arthritis    especially in shoulders   Asthma    Depression    Diabetes mellitus    Embolic stroke ([place]) 1/70/0174   GERD (gastroesophageal reflux disease)    Headache(784.0)    "mild"   Hypertension    Loop Biotronik loop implant 12/16/2020 12/16/2020   Mental disorder    depression   Neuropathy    feet    Pain    arthritis pain - takes tramadol as needed   Rectal polyp    very little bleeding with bowel movements- no pain   Stroke ([place]) 11/03/2020   Suicide attempt [facility])     Past Surgical History:  Procedure Laterality Date   ANAL RECTAL MANOMETRY N/A 07/13/2016   Procedure: ANO RECTAL MANOMETRY;  Surgeon: Leighton Ruff, MD;  [facility];  Service: Endoscopy;  Laterality: N/A;   CESAREAN SECTION     EUS N/A 11/21/2012   Procedure: LOWER ENDOSCOPIC ULTRASOUND (EUS);  Surgeon: Arta Silence, MD;  [facility];  Service: Endoscopy;  Laterality: N/A;    FLEXIBLE SIGMOIDOSCOPY N/A 11/21/2012   Procedure: FLEXIBLE SIGMOIDOSCOPY;  Surgeon: Arta Silence, MD;  [facility];  Service: Endoscopy;  Laterality: N/A;   FLEXIBLE SIGMOIDOSCOPY N/A 01/28/2013   Procedure: FLEXIBLE SIGMOIDOSCOPY;  Surgeon: Leighton Ruff, MD;  [facility];  Service: Endoscopy;  Laterality: N/A;   LAPAROSCOPIC LOW ANTERIOR RESECTION N/A 01/29/2013   Procedure: LAPAROSCOPIC LOW ANTERIOR RESECTION, Rigid Proctoscopy;  Surgeon: Leighton Ruff, MD;  [facility];  Service: General;  Laterality: N/A;   LAPAROSCOPIC SIGMOID COLECTOMY N/A 11/14/2012   Procedure: DIAGNOSTIC LAPAROSCOPY AND SIGMOIDMOIDOSCOPY ;  Surgeon: Rolm Bookbinder, MD;  [facility];  Service: General;  Laterality: N/A;   RECTAL ULTRASOUND N/A 07/13/2016   Procedure: RECTAL ULTRASOUND;  Surgeon: Leighton Ruff, MD;  [facility];  Service: Endoscopy;  Laterality: N/A;   TONSILLECTOMY     TONSILLECTOMY AND ADENOIDECTOMY      Vitals:   07/21/21 1021  BP: (!) 142/88  Pulse: 88  SpO2: 100%     Subjective Assessment - 07/21/21 1016     Subjective Pt reports she saw Dr.Avbuere on Friday. He does not think that she had a stroke but did lot of bloodwork and she hasn't heard back on results yet. Pt still not sleeping well (  about 4 hours/night). Left leg still feels weaker and has no energy. Pt has follow-up virtual call with the doctor this week sometime.    Pertinent History arthritis, asthma, depression, HTN, neuropathy, suicide attempt, DM, CVA    Patient Stated Goals Pt would like to be able to walk and use her hands better.    Currently in Pain? Yes    Pain Score 4     Pain Location Shoulder    Pain Orientation Left    Pain Descriptors / Indicators Sore    Pain Type Chronic pain                               [facility] Adult PT Treatment/Exercise - 07/21/21 1025       Transfers   Transfers Sit to Stand;Stand to Sit    Sit to Stand 6: Modified  independent (Device/Increase time)    Stand to Sit 6: Modified independent (Device/Increase time)    Comments increased time to rise with transfers      Ambulation/Gait   Ambulation/Gait Yes    Ambulation/Gait Assistance 5: Supervision    Ambulation/Gait Assistance Details in/out of clinic with walker with slow gait speed and decreased left foot clearance stopping to stand and rest a couple times.    Ambulation Distance (Feet) 60 Feet   x 2   Assistive device Rolling walker    Gait Pattern Step-through pattern;Decreased step length - right;Decreased step length - left    Ambulation Surface Level;Indoor      Standardized Balance Assessment   Standardized Balance Assessment Berg Balance Test      Berg Balance Test   Sit to Stand Able to stand without using hands and stabilize independently    Standing Unsupported Able to stand safely 2 minutes    Sitting with Back Unsupported but Feet Supported on Floor or Stool Able to sit safely and securely 2 minutes    Stand to Sit Sits safely with minimal use of hands    Transfers Able to transfer safely, definite need of hands    Standing Unsupported with Eyes Closed Able to stand 10 seconds with supervision    From Standing Position, Turn to Look Behind Over each Shoulder Turn sideways only but maintains balance    Turn 360 Degrees Needs assistance while turning                     PT Education - 07/21/21 1048     Education Details Pt was instructed to follow-up with MD again. PT will also send him a note. PT will call to check on pt next Tuesday before scheduled visit on Wed but if still feeling like this will most likely hold/discharge at this time.    Person(s) Educated Patient    Methods Explanation    Comprehension Verbalized understanding              PT Short Term Goals - 06/03/21 2006       PT SHORT TERM GOAL #1   Title Pt will increase Berg from 49/56 to >53/56 for improved balance and decreased fall risk.     Baseline 04/20/21 49/56 > decreased to 46/56    Time 4    Period Weeks    Status On-going    Target Date 07/01/21      PT SHORT TERM GOAL #2   Title Pt will ambulate up/down curb and ramp with cane mod  I for improved community access.    Baseline 05/27/21 supervision on ramp and curb    Time 4    Period Weeks    Status On-going    Target Date 07/01/21      PT SHORT TERM GOAL #3   Title Pt will increase gait speed to >0.60ms with cane for improved community mobility.    Baseline 0.656m > declined slightly to .625 m/sec. 05/27/21 0.7270mwith cane    Time 4    Period Weeks    Status On-going    Target Date 07/01/21               PT Long Term Goals - 06/03/21 2009       PT LONG TERM GOAL #1   Title Pt will ambulate >500' on varied level surfaces mod I with cane with quad tip for improved short community distances.(LTGs due 07/30/21)    Time 8    Period Weeks    Status New    Target Date 07/30/21      PT LONG TERM GOAL #2   Title Pt will increase DGI from 14/24 to >19/24 for improved balance and gait safety.    Baseline 04/20/21 14/24. 06/03/21 17/24    Time 8    Period Weeks    Status On-going    Target Date 07/30/21      PT LONG TERM GOAL #3   Title Pt will ambulate up/down 4 steps with cane in reciprocal pattern for improved community access and functional strength.    Baseline 06/03/21 able to perform with rail in reciprocal pattern    Time 8    Period Weeks    Status On-going    Target Date 07/30/21      PT LONG TERM GOAL #4   Title Pt will be able to complete 6 min walk without resting for improved activity tolerance.    Baseline had to rest after 2 min 45 sec    Time 8    Period Weeks    Status New    Target Date 07/30/21      PT LONG TERM GOAL #5   Title FOTO will be completed at d/c    Baseline 06/03/21 62    Time 8    Period Weeks    Status New    Target Date 07/30/21                   Plan - 07/21/21 1050     Clinical Impression  Statement Pt continues to report high fatigue levels. BP was better today along with other vitals. Attempted to check STGs which are overdue but pt unable to tolerate completing Berg Balance due to severe fatigue. Pt requested to stop. PT will notify MD and check on pt before next week's visit to decide plan.    Personal Factors and Comorbidities Comorbidity 3+    Comorbidities arthritis, asthma, depression, HTN, neuropathy, suicide attempt, DM    Examination-Activity Limitations Locomotion Level;Transfers;Stand;Stairs;Squat    Examination-Participation Restrictions Community Activity;Driving;Yard Work;Cleaning    Stability/Clinical Decision Making Evolving/Moderate complexity    Rehab Potential Good    PT Frequency 1x / week    PT Duration 8 weeks    PT Treatment/Interventions ADLs/Self Care Home Management;DME Instruction;Neuromuscular re-education;Manual techniques;Balance training;Therapeutic exercise;Therapeutic activities;Functional mobility training;Stair training;Gait training;Vestibular;Passive range of motion;Orthotic Fit/Training;Patient/family education    PT Next Visit Plan PT going to check on pt prior to next visit to decide on plan due to severe  fatigue limiting ability to participate in therapy at this time. Last visit in [place] next week. continue LLE strengthening, SLS LLE, static and dynamic balance training. Gait/stair training with cane with quad tip. Curb/ramp training. Step-ups with LLE working on control. NuStep or SciFit to try to improve activity tolerance.    Consulted and Agree with Plan of Care Patient             Patient will benefit from skilled therapeutic intervention in order to improve the following deficits and impairments:  Abnormal gait, Decreased balance, Decreased activity tolerance, Decreased mobility, Decreased strength, Decreased knowledge of use of DME, Decreased endurance, Impaired sensation  Visit Diagnosis: Other abnormalities of gait and  mobility  Muscle weakness (generalized)     Problem List Patient Active Problem List   Diagnosis Date Noted   Encounter for loop recorder check 01/02/2021   Loop Biotronik loop implant 12/16/2020 12/16/2020   Leukopenia    Controlled type 2 diabetes mellitus with hyperglycemia, with long-term current use of insulin (HCC)    Vascular headache    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[place]) 25/36/6440   Embolic stroke ([place]) 34/74/2595   Sinus bradycardia on ECG 11/03/2020   Benign essential HTN    Pancytopenia ([place])    Stage 3b chronic kidney disease ([place])    Anemia    Labile blood glucose    Labile blood pressure    Diabetic peripheral neuropathy ([place])    Essential hypertension    AKI (acute kidney injury) ([place])    Ataxia, late effect of cerebrovascular disease    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[place]) 06/30/2020   Acute CVA (cerebrovascular accident) ([place]) 06/26/2020   CVA (cerebral vascular accident) ([place]) 06/25/2020   BMI 32.0-32.9,adult 06/03/2020   Left upper lobe pneumonia 03/30/2020   Chest pain 10/01/2019   MDD (major depressive disorder), recurrent severe, without psychosis ([place]) 05/09/2019   Suicide attempt ([place]) 05/09/2019   Cocaine abuse ([place]) 05/09/2019   Interstitial lung disease ([place]) 06/21/2016   COPD (chronic obstructive pulmonary disease) ([place]) 06/21/2016   Cough 06/21/2016   GERD (gastroesophageal reflux disease) 06/21/2016   Hypersomnia 06/21/2016   Tobacco user 06/21/2016   Rectal cancer ([place]) 01/30/2013    Electa Sniff, PT, DPT, NCS 07/21/2021, 10:53 AM  [facility] [address] [place], [place], [address] Phone: [phone]   Fax:  [phone]  Name: JAYDAH STAHLE MRN: [id number] Date of Birth: [birth year]

## 2021-07-28 ENCOUNTER — Ambulatory Visit: Payer: Medicare HMO | Admitting: Physical Therapy

## 2021-08-17 ENCOUNTER — Other Ambulatory Visit: Payer: Self-pay

## 2021-08-17 ENCOUNTER — Ambulatory Visit: Payer: Medicare HMO | Attending: Physical Medicine & Rehabilitation

## 2021-08-17 DIAGNOSIS — M6281 Muscle weakness (generalized): Secondary | ICD-10-CM

## 2021-08-17 DIAGNOSIS — R2689 Other abnormalities of gait and mobility: Secondary | ICD-10-CM

## 2021-08-17 DIAGNOSIS — R2681 Unsteadiness on feet: Secondary | ICD-10-CM | POA: Diagnosis present

## 2021-08-17 NOTE — Therapy (Signed)
OUTPATIENT PHYSICAL THERAPY TREATMENT NOTE/Recert   Patient Name: Erica [name] MRN: [id number] DOB:Jul 28, 1955, 66 y.o., female Today's Date: 08/17/2021  PCP: Nolene Ebbs, MD REFERRING PROVIDER: [name]   PT End of Session - 08/17/21 0847     Visit Number 26    Number of Visits 33    Date for PT Re-Evaluation 10/08/21    Authorization Type [employer]    Authorization Time Period 68 visists from 11/8-12/30/22; new approval from 12/29-2/17/23- just need visit limit- message has been sent    Authorization - Visit Number 3    Progress Note Due on Visit 47    PT Start Time 0845    PT Stop Time 0932    PT Time Calculation (min) 47 min    Equipment Utilized During Treatment Gait belt    Activity Tolerance Patient limited by fatigue    Behavior During Therapy [facility] for tasks assessed/performed             Past Medical History:  Diagnosis Date   Arthritis    especially in shoulders   Asthma    Depression    Diabetes mellitus    Embolic stroke ([place]) 1/93/7902   GERD (gastroesophageal reflux disease)    Headache(784.0)    "mild"   Hypertension    Loop Biotronik loop implant 12/16/2020 12/16/2020   Mental disorder    depression   Neuropathy    feet    Pain    arthritis pain - takes tramadol as needed   Rectal polyp    very little bleeding with bowel movements- no pain   Stroke ([place]) 11/03/2020   Suicide attempt [facility])    Past Surgical History:  Procedure Laterality Date   ANAL RECTAL MANOMETRY N/A 07/13/2016   Procedure: ANO RECTAL MANOMETRY;  Surgeon: Leighton Ruff, MD;  Location: [facility] ENDOSCOPY;  Service: Endoscopy;  Laterality: N/A;   CESAREAN SECTION     EUS N/A 11/21/2012   Procedure: LOWER ENDOSCOPIC ULTRASOUND (EUS);  Surgeon: Arta Silence, MD;  Location: [facility];  Service: Endoscopy;  Laterality: N/A;   FLEXIBLE SIGMOIDOSCOPY N/A 11/21/2012   Procedure: FLEXIBLE SIGMOIDOSCOPY;  Surgeon: Arta Silence, MD;  Location: [facility]OS[facility];  Service:  Endoscopy;  Laterality: N/A;   FLEXIBLE SIGMOIDOSCOPY N/A 01/28/2013   Procedure: FLEXIBLE SIGMOIDOSCOPY;  Surgeon: Leighton Ruff, MD;  Location: [facility];  Service: Endoscopy;  Laterality: N/A;   LAPAROSCOPIC LOW ANTERIOR RESECTION N/A 01/29/2013   Procedure: LAPAROSCOPIC LOW ANTERIOR RESECTION, Rigid Proctoscopy;  Surgeon: Leighton Ruff, MD;  Location: [facility]S;  Service: General;  Laterality: N/A;   LAPAROSCOPIC SIGMOID COLECTOMY N/A 11/14/2012   Procedure: DIAGNOSTIC LAPAROSCOPY AND SIGMOIDMOIDOSCOPY ;  Surgeon: Rolm Bookbinder, MD;  Location: [facility];  Service: General;  Laterality: N/A;   RECTAL ULTRASOUND N/A 07/13/2016   Procedure: RECTAL ULTRASOUND;  Surgeon: Leighton Ruff, MD;  Location: [facility]OS[facility];  Service: Endoscopy;  Laterality: N/A;   TONSILLECTOMY     TONSILLECTOMY AND ADENOIDECTOMY     Patient Active Problem List   Diagnosis Date Noted   Encounter for loop recorder check 01/02/2021   Loop Biotronik loop implant 12/16/2020 12/16/2020   Leukopenia    Controlled type 2 diabetes mellitus with hyperglycemia, with long-term current use of insulin (HCC)    Vascular headache    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[place]) 40/97/3532   Embolic stroke ([place]) 99/24/2683   Sinus bradycardia on ECG 11/03/2020   Benign essential HTN    Pancytopenia ([place])    Stage 3b chronic kidney disease ([place])  Anemia    Labile blood glucose    Labile blood pressure    Diabetic peripheral neuropathy (HCC)    Essential hypertension    AKI (acute kidney injury) ([place])    Ataxia, late effect of cerebrovascular disease    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[place]) 06/30/2020   Acute CVA (cerebrovascular accident) ([place]) 06/26/2020   CVA (cerebral vascular accident) ([place]) 06/25/2020   BMI 32.0-32.9,adult 06/03/2020   Left upper lobe pneumonia 03/30/2020   Chest pain 10/01/2019   MDD (major depressive disorder), recurrent severe, without psychosis ([place]) 05/09/2019   Suicide attempt ([place]) 05/09/2019   Cocaine abuse ([place])  05/09/2019   Interstitial lung disease ([place]) 06/21/2016   COPD (chronic obstructive pulmonary disease) ([place]) 06/21/2016   Cough 06/21/2016   GERD (gastroesophageal reflux disease) 06/21/2016   Hypersomnia 06/21/2016   Tobacco user 06/21/2016   Rectal cancer ([place]) 01/30/2013    REFERRING DIAG: gait disturbance, post-stroke  THERAPY DIAG:  Other abnormalities of gait and mobility  Muscle weakness (generalized)  Unsteadiness on feet  PERTINENT HISTORY: arthritis, asthma, depression, HTN, neuropathy, suicide attempt, DM   PRECAUTIONS: fall  SUBJECTIVE: Pt reports that she is doing a little better. Doctor started her on vitamin B12 and if energy does not improve will have to go to a shot.  PAIN:  Are you having pain? No    TODAY'S TREATMENT:   [facility] Adult PT Treatment/Exercise -       Ambulation/Gait   Ambulation/Gait Yes    Ambulation/Gait Assistance 5: Supervision;4: Min guard    Ambulation/Gait Assistance Details In/out of clinic with RW. During session with cane with quad tip. PT adjusted left foot up brace to provide more pull which helped with toe clearance but as pt fatigued she started to drag more with decreased left stance time.    Assistive device Rolling walker;Straight cane   with quad tip, left foot up brace   Gait Pattern Step-through pattern;Decreased step length - right;Decreased step length - left;Decreased stance time - left;Decreased hip/knee flexion - left    Ambulation Surface Level;Indoor    Gait velocity 17.35 sec=0.44ms      Standardized Balance Assessment   Standardized Balance Assessment Berg Balance Test;Dynamic Gait Index      Berg Balance Test   Sit to Stand Able to stand without using hands and stabilize independently    Standing Unsupported Able to stand safely 2 minutes    Sitting with Back Unsupported but Feet Supported on Floor or Stool Able to sit safely and securely 2 minutes    Stand to Sit Sits safely with minimal use of hands     Transfers Able to transfer safely, minor use of hands    Standing Unsupported with Eyes Closed Able to stand 10 seconds safely    Standing Ubsupported with Feet Together Able to place feet together independently and stand 1 minute safely    From Standing, Reach Forward with Outstretched Arm Can reach forward >12 cm safely (5")    From Standing Position, Pick up Object from Floor Able to pick up shoe safely and easily    From Standing Position, Turn to Look Behind Over each Shoulder Turn sideways only but maintains balance    Turn 360 Degrees Able to turn 360 degrees safely but slowly    Standing Unsupported, Alternately Place Feet on Step/Stool Able to complete 4 steps without aid or supervision    Standing Unsupported, One Foot in Front Able to take small step independently and hold 30 seconds  Standing on One Leg Tries to lift leg/unable to hold 3 seconds but remains standing independently    Total Score 44      Dynamic Gait Index   Level Surface Moderate Impairment    Change in Gait Speed Mild Impairment    Gait with Horizontal Head Turns Moderate Impairment    Gait with Vertical Head Turns Mild Impairment    Gait and Pivot Turn Mild Impairment    Step Over Obstacle Moderate Impairment    Step Around Obstacles Mild Impairment    Steps Mild Impairment    Total Score 13             [facility] PT Assessment -       Assessment   Medical Diagnosis CVA    Referring Provider (PT) [name]    Onset Date/Surgical Date 01/21/21      6 Minute Walk- Baseline   6 Minute Walk- Baseline yes    BP (mmHg) 160/92    HR (bpm) 96    Modified Borg Scale for Dyspnea 7- Severe shortness of breath or very hard breathing      6 Minute walk- Post Test   6 Minute Walk Post Test yes    BP (mmHg) 180/100    HR (bpm) 110    Modified Borg Scale for Dyspnea 7- Severe shortness of breath or very hard breathing      6 minute walk test results    Aerobic Endurance Distance Walked 230     Endurance additional comments with cane with quad tip.  Completed 230'.               PATIENT EDUCATION: Education details: PT recert plan and progress towards goals Person educated: Patient Education method: Explanation Education comprehension: verbalized understanding   HOME EXERCISE PROGRAM: [email or web address]   PT Short Term Goals - 08/17/21 0916       PT SHORT TERM GOAL #1   Title Pt will increase Berg from 49/56 to >53/56 for improved balance and decreased fall risk.    Baseline 04/20/21 49/56 > decreased to 46/56. 08/17/21 44/56    Time 4    Period Weeks    Status On-going    Target Date 09/14/21      PT SHORT TERM GOAL #2   Title Pt will ambulate up/down curb and ramp with cane mod I for improved community access.    Baseline 05/27/21 supervision on ramp and curb    Time 4    Period Weeks    Status On-going    Target Date 09/14/21      PT SHORT TERM GOAL #3   Title Pt will increase gait speed to >0.51ms with cane for improved community mobility.    Baseline 0.652m > declined slightly to .625 m/sec. 05/27/21 0.7240mwith cane. 08/17/21 0.1m65mith cane    Time 4    Period Weeks    Status On-going    Target Date 09/14/21              PT Long Term Goals - 08/17/21 0917       PT LONG TERM GOAL #1   Title Pt will ambulate >500' on varied level surfaces mod I with cane with quad tip for improved short community distances.(LTGs due 10/08/21)    Baseline 08/17/21 Pt ambulated 230' on level surfaces with cane with quad tip supervision/CGA as fatigued    Time 8    Period Weeks    Status On-going  Target Date 10/08/21      PT LONG TERM GOAL #2   Title Pt will increase DGI from 14/24 to >19/24 for improved balance and gait safety.    Baseline 04/20/21 14/24. 06/03/21 17/24. 08/17/21 13/24    Time 8    Period Weeks    Status On-going    Target Date 10/08/21      PT LONG TERM GOAL #3   Title Pt will ambulate up/down 4 steps with cane in reciprocal pattern for  improved community access and functional strength.    Baseline 06/03/21 able to perform with rail in reciprocal pattern. 08/17/21 reciprocal pattern with bilateral rails    Time 8    Period Weeks    Status On-going    Target Date 10/08/21      PT LONG TERM GOAL #4   Title Pt will be able to complete 6 min walk without resting for improved activity tolerance.    Baseline had to rest after 2 min 45 sec. 08/17/21 Had to rest at 2 min 37 sec. Performed with cane    Time 8    Period Weeks    Status On-going    Target Date 10/08/21      PT LONG TERM GOAL #5   Title FOTO will be completed at d/c    Baseline 06/03/21 62    Time 8    Period Weeks    Status On-going    Target Date 10/08/21              Plan - 08/17/21 0933     Clinical Impression Statement Pt returns after a month out due to having health issues with severe fatigue. She was started on vitamin B12 to try to help and states that MD will switch to shot if it is not helping. Pt looked better than at last visit and was able to tolerate testing today. She did not meet any goals, however. Still fall risk based on Berg of 44/56 and DGI of 13/24. Pt is ambulating in community with RW. Short distances with cane with quad tip. She fatigues quickly with cane especially in left leg. Unable to complete 6 min walk due to increased fatigue as went on. Pt will continue to benefit from skilled PT to continue to work on improving activity tolerance, strength and balance.    Personal Factors and Comorbidities Comorbidity 3+    Comorbidities arthritis, asthma, depression, HTN, neuropathy, suicide attempt, DM    Examination-Activity Limitations Locomotion Level;Transfers;Stand;Stairs;Squat    Examination-Participation Restrictions Community Activity;Driving;Yard Work;Cleaning    Stability/Clinical Decision Making Evolving/Moderate complexity    Rehab Potential Good    PT Frequency 1x / week    PT Duration 8 weeks    PT Treatment/Interventions  ADLs/Self Care Home Management;DME Instruction;Neuromuscular re-education;Manual techniques;Balance training;Therapeutic exercise;Therapeutic activities;Functional mobility training;Stair training;Gait training;Vestibular;Passive range of motion;Orthotic Fit/Training;Patient/family education    PT Next Visit Plan continue LLE strengthening, SLS LLE, static and dynamic balance training. Gait/stair training with cane with quad tip. Curb/ramp training. Step-ups with LLE working on control. NuStep or SciFit to try to improve activity tolerance. Will only go the whole 8 weeks if showing improvement at 4 week check in.    Consulted and Agree with Plan of Care Patient               Electa Sniff, PT, DPT, NCS 08/17/2021, 10:08 AM

## 2021-08-18 ENCOUNTER — Encounter: Payer: Self-pay | Admitting: Cardiology

## 2021-08-18 ENCOUNTER — Ambulatory Visit: Payer: Medicare HMO | Admitting: Cardiology

## 2021-08-18 VITALS — BP 143/84 | HR 81 | Temp 97.2°F | Resp 16 | Ht 63.0 in | Wt 183.0 lb

## 2021-08-18 DIAGNOSIS — Z794 Long term (current) use of insulin: Secondary | ICD-10-CM

## 2021-08-18 DIAGNOSIS — N1832 Chronic kidney disease, stage 3b: Secondary | ICD-10-CM

## 2021-08-18 DIAGNOSIS — R0602 Shortness of breath: Secondary | ICD-10-CM

## 2021-08-18 DIAGNOSIS — I129 Hypertensive chronic kidney disease with stage 1 through stage 4 chronic kidney disease, or unspecified chronic kidney disease: Secondary | ICD-10-CM

## 2021-08-18 DIAGNOSIS — I2584 Coronary atherosclerosis due to calcified coronary lesion: Secondary | ICD-10-CM

## 2021-08-18 DIAGNOSIS — Z87891 Personal history of nicotine dependence: Secondary | ICD-10-CM

## 2021-08-18 DIAGNOSIS — Z95818 Presence of other cardiac implants and grafts: Secondary | ICD-10-CM

## 2021-08-18 DIAGNOSIS — I2510 Atherosclerotic heart disease of native coronary artery without angina pectoris: Secondary | ICD-10-CM

## 2021-08-18 DIAGNOSIS — N183 Chronic kidney disease, stage 3 unspecified: Secondary | ICD-10-CM

## 2021-08-18 DIAGNOSIS — Z8673 Personal history of transient ischemic attack (TIA), and cerebral infarction without residual deficits: Secondary | ICD-10-CM

## 2021-08-18 DIAGNOSIS — E781 Pure hyperglyceridemia: Secondary | ICD-10-CM

## 2021-08-18 DIAGNOSIS — E782 Mixed hyperlipidemia: Secondary | ICD-10-CM

## 2021-08-18 NOTE — Progress Notes (Signed)
Erica [name] Date of Birth: [birth year] MRN: [id number] Primary Care Provider:Avbuere, Christean Grief, MD Former Cardiology Providers: Dr. Adrian Prows, Jeri Lager, APRN, FNP-C Primary Cardiologist: Rex Kras, DO, [facility] (established care 11/19/2019)  Date: 08/18/21 Last Office Visit: 03/25/2021  Chief Complaint  Patient presents with  . Follow-up  . Shortness of Breath    HPI   Erica [name] is a 66 y.o.  female who presents to the office with a chief complaint of "shortness of breath evaluation." Patient's past medical history and cardiovascular risk factors include: Hx of stroke involving the right caudate nucleus and right anterolateral thalamus (Jan 2022), Hx of  right parietooccipital stroke (May 2022), rectal CA in 2014 that is in remission, T2DM, former tobacco use, emphysema, interstitial lung disease, idiopathic pulmonary fibrosis, and coronary artery calcification on CT scan.    Patient presents today for evaluation of shortness of breath predominantly with effort related activities.  She also complains of being tired and fatigue.  She denies any chest pain at rest or with effort related activities.  No hospitalizations or urgent care visits since last office encounter.  And she has not had any reoccurrence of stroke for the past 1 year.  Patient denies orthopnea, paroxysmal nocturnal dyspnea or lower extremity swelling.  ALLERGIES: Allergies  Allergen Reactions  . Penicillins Hives, Itching and Swelling    Tongue swells up Has patient had a PCN reaction causing immediate rash, facial/tongue/throat swelling, SOB or lightheadedness with hypotension: Yes Has patient had a PCN reaction causing severe rash involving mucus membranes or skin necrosis: Yes Has patient had a PCN reaction that required hospitalization Yes Has patient had a PCN reaction occurring within the last 10 years: No If all of the above answers are "NO", then may proceed with Cephalosporin use.      MEDICATION LIST  PRIOR TO VISIT: Current Outpatient Medications on File Prior to Visit  Medication Sig Dispense Refill  . acetaminophen (TYLENOL) 325 MG tablet Take 2 tablets (650 mg total) by mouth every 4 (four) hours as needed for mild pain (or temp > 37.5 C (99.5 F)).    Marland Kitchen albuterol (VENTOLIN HFA) 108 (90 Base) MCG/ACT inhaler Inhale 2 puffs into the lungs every 4 (four) hours as needed for wheezing. 1 g 5  . amLODipine (NORVASC) 10 MG tablet Take 1 tablet (10 mg total) by mouth daily. 30 tablet 2  . aspirin EC 81 MG EC tablet Take 1 tablet (81 mg total) by mouth daily. Swallow whole. 30 tablet 11  . busPIRone (BUSPAR) 10 MG tablet Take 1 tablet (10 mg total) by mouth 2 (two) times daily. 60 tablet 0  . diclofenac Sodium (VOLTAREN) 1 % GEL Apply 2 g topically daily as needed (Knee pain). 2 g 0  . famotidine (PEPCID) 20 MG tablet Take 1 tablet (20 mg total) by mouth daily. 30 tablet 0  . fenofibrate (TRICOR) 145 MG tablet Take 145 mg by mouth daily.    Marland Kitchen gabapentin (NEURONTIN) 100 MG capsule Take 2 capsules (200 mg total) by mouth 2 (two) times daily as needed (pain). 60 capsule 0  . insulin glargine (LANTUS SOLOSTAR) 100 UNIT/ML Solostar Pen Please provide 24 units every a.m. and 20 units every p.m. 15 mL 11  . Insulin Pen Needle (PEN NEEDLES) 32G X 6 MM MISC 1 application by Does not apply route 2 (two) times daily. 100 each 1  . losartan (COZAAR) 50 MG tablet Take 1 tablet (50 mg total) by mouth daily. Harvey  tablet 0  . Multiple Vitamin (MULTIVITAMIN ADULT PO) Take 1 tablet by mouth daily.    . Pirfenidone (ESBRIET) 801 MG TABS Take 801 mg by mouth with breakfast, with lunch, and with evening meal. 270 tablet 1  . rosuvastatin (CRESTOR) 20 MG tablet Take 1 tablet (20 mg total) by mouth daily. 30 tablet 0  . SYMBICORT 160-4.5 MCG/ACT inhaler Inhale 2 puffs into the lungs 2 (two) times daily. 1 each 5  . Tiotropium Bromide Monohydrate (SPIRIVA RESPIMAT) 1.25 MCG/ACT AERS Inhale 2 puffs into the lungs daily. 4 g 5   . traMADol (ULTRAM) 50 MG tablet Take 1 tablet (50 mg total) by mouth every 8 (eight) hours as needed for severe pain. 30 tablet 0  . venlafaxine XR (EFFEXOR-XR) 75 MG 24 hr capsule Take 1 capsule (75 mg total) by mouth daily with breakfast. 30 capsule 0  . vitamin B-12 (CYANOCOBALAMIN) 500 MCG tablet Take 500 mcg by mouth daily.     No current facility-administered medications on file prior to visit.    PAST MEDICAL HISTORY: Past Medical History:  Diagnosis Date  . Arthritis    especially in shoulders  . Asthma   . Depression   . Diabetes mellitus   . Embolic stroke ([place]) 3/42/8768  . GERD (gastroesophageal reflux disease)   . Headache(784.0)    "mild"  . Hypertension   . Loop Biotronik loop implant 12/16/2020 12/16/2020  . Mental disorder    depression  . Neuropathy    feet   . Pain    arthritis pain - takes tramadol as needed  . Rectal polyp    very little bleeding with bowel movements- no pain  . Stroke ([place]) 11/03/2020  . Suicide attempt [facility])     PAST SURGICAL HISTORY: Past Surgical History:  Procedure Laterality Date  . ANAL RECTAL MANOMETRY N/A 07/13/2016   Procedure: ANO RECTAL MANOMETRY;  Surgeon: Leighton Ruff, MD;  Location: [facility];  Service: Endoscopy;  Laterality: N/A;  . CESAREAN SECTION    . EUS N/A 11/21/2012   Procedure: LOWER ENDOSCOPIC ULTRASOUND (EUS);  Surgeon: Arta Silence, MD;  Location: [facility];  Service: Endoscopy;  Laterality: N/A;  . FLEXIBLE SIGMOIDOSCOPY N/A 11/21/2012   Procedure: FLEXIBLE SIGMOIDOSCOPY;  Surgeon: Arta Silence, MD;  Location: [facility];  Service: Endoscopy;  Laterality: N/A;  . FLEXIBLE SIGMOIDOSCOPY N/A 01/28/2013   Procedure: FLEXIBLE SIGMOIDOSCOPY;  Surgeon: Leighton Ruff, MD;  Location: [facility];  Service: Endoscopy;  Laterality: N/A;  . LAPAROSCOPIC LOW ANTERIOR RESECTION N/A 01/29/2013   Procedure: LAPAROSCOPIC LOW ANTERIOR RESECTION, Rigid Proctoscopy;  Surgeon: Leighton Ruff, MD;  Location: [facility];   Service: General;  Laterality: N/A;  . LAPAROSCOPIC SIGMOID COLECTOMY N/A 11/14/2012   Procedure: DIAGNOSTIC LAPAROSCOPY AND SIGMOIDMOIDOSCOPY ;  Surgeon: Rolm Bookbinder, MD;  Location: [facility];  Service: General;  Laterality: N/A;  . RECTAL ULTRASOUND N/A 07/13/2016   Procedure: RECTAL ULTRASOUND;  Surgeon: Leighton Ruff, MD;  Location: [facility];  Service: Endoscopy;  Laterality: N/A;  . TONSILLECTOMY    . TONSILLECTOMY AND ADENOIDECTOMY      FAMILY HISTORY: The patient's family history includes Cancer in her maternal aunt, maternal uncle, maternal uncle, maternal uncle, paternal aunt, and paternal uncle; Cancer (age of onset: 85) in an other family member; Diabetes in her father; Heart disease in her mother; Hyperlipidemia in her mother; Hypertension in her brother, brother, father, mother, sister, and sister; Stroke in her father.   SOCIAL HISTORY:  The patient  reports that she quit  smoking about 3 years ago. Her smoking use included cigarettes. She has a 10.00 pack-year smoking history. She has never used smokeless tobacco. She reports that she does not currently use alcohol. She reports that she does not currently use drugs after having used the following drugs: "Crack" cocaine.  Review of Systems  Constitutional: Positive for malaise/fatigue.  Cardiovascular:  Positive for dyspnea on exertion. Negative for chest pain, leg swelling, near-syncope, orthopnea, palpitations, paroxysmal nocturnal dyspnea and syncope.  Respiratory:  Positive for shortness of breath.    PHYSICAL EXAM: Vitals with BMI 08/18/2021 08/18/2021 07/21/2021  Height - _0  -  Weight - 183 lbs -  BMI - 19.14 -  Systolic [phone]  Diastolic 84 87 88  Pulse 81 81 88    CONSTITUTIONAL: Appears older than stated age.  Hemodynamically stable.  well-nourished. No acute distress.  Ambulates with 2 wheel walker SKIN: Skin is warm and dry. No rash noted. No cyanosis. No pallor. No jaundice HEAD: Normocephalic and  atraumatic.  EYES: No scleral icterus MOUTH/THROAT: Moist oral membranes.  NECK: No JVD present. No thyromegaly noted. No carotid bruits  CHEST Normal respiratory effort. No intercostal retractions  LUNGS: Clear to auscultation in the upper lung fields with dry crackles noted at bilateral bases, no expiratory wheezing.  Marland Kitchen CARDIOVASCULAR: Regular rate and rhythm, positive S1-S2, no murmurs rubs or gallops appreciated. ABDOMINAL: Obese, soft, nontender, nondistended, positive bowel sounds all 4 quadrants. No apparent ascites.  EXTREMITIES: No peripheral edema. Left ankle brace. HEMATOLOGIC: No significant bruising NEUROLOGIC: Oriented to person, place, and time.  5 out of 5 strength in the right upper and lower extremity.  4 out of 5 strength in the left upper and lower extremity. PSYCHIATRIC: Normal mood and affect. Normal behavior. Cooperative  RADIOLOGY: CT of chest 04/12/2019:  1. The appearance of the lungs remains compatible with interstitial lung disease, with a spectrum of findings considered diagnostic of usual interstitial pneumonia (UIP) per current ATS guidelines. Minimal progression compared to the prior study. 2. Aortic atherosclerosis, in addition to left anterior descending coronary artery disease.  3. Cholelithiasis.  Aortic Atherosclerosis (ICD10-I70.0).  CT head without contrast 06/25/2020: 1. Lacunar infarcts in the right caudate and right cerebellum, new from 2012, but age indeterminate. 2. No hemorrhage or evidence of large or territorial ischemia. 3. Chronic left sphenoid sinusitis.  MRI brain without contrast 06/25/2020: 1. 1 cm acute infarction in the anterolateral thalamus on the right, possibly with some involvement of the posterior limb internal capsule. Acute infarction within the right caudate head. Scattered other foci of high signal on diffusion imaging within the hemispheric white matter, not showing restricted diffusion on the ADC map, and therefore probably  representing T2 shine through from old white matter infarctions. 2. Extensive old small vessel infarctions throughout the brain as outlined above. Small old cortical infarctions in the right frontal lobe and both parieto-occipital regions. 3. No large or medium vessel occlusion or correctable proximal stenosis. Mild atherosclerotic irregularity of the PCA branches.  MR cervical spine without contrast 06/26/2020 1. 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 Mild generalized atrophy of the upper cervical spinal cord. 2. Moderate C6-7 and mild C5-6 and C7-T1 spinal canal stenosis secondary to central disc protrusions.   MR Brain Wo Contrast (neuro protocol) Result Date: 11/03/2020 IMPRESSION: Newly seen punctate acute infarction at the medial right parietooccipital junction. No other change since the most recent exam of January. Chronic small-vessel ischemic changes of the pons,  cerebellum, thalami, basal ganglia and hemispheric white matter. Old cortical and subcortical infarctions in the occipital regions and frontoparietal regions.  CARDIAC DATABASE: EKG: 08/18/2021: NSR, 77 bpm, LAE, nonspecific T wave abnormality.  Echocardiogram: 11/04/2020: LVEF 60-65%, moderate LVH, grade 1 diastolic impairment, elevated LAP, normal right ventricular size and function, mild AR.  Stress Testing:  Lexiscan tetrofosmin stress test 07/22/2019: There is  apical thinning an mild soft tissue attenuation artifact in inferior wall without ischemia or infarct. All segments of left ventricle demonstrated normal wall motion and thickening. Stress LV EF is moderately dysfunctional 39%, however visually appearted to be normal. No previous exam available for comparison. Low risk study.   Carotid Duplex 11/05/2020: Right Carotid: The extracranial vessels were near-normal with only minimal wall thickening or plaque.  Left Carotid: The extracranial vessels were near-normal with only  minimal wall thickening or plaque.  Vertebrals: Bilateral vertebral arteries demonstrate antegrade flow.   Heart Catheterization: None  14 day extended Holter monitor: Dominant rhythm normal sinus rhythm Heart rate 44-179 bpm.  Avg HR 69 bpm. No atrial fibrillation, high grade AV block, pauses (3 seconds or longer). 1 episode of nonsustained ventricular tachycardia, 13 beats in duration, max heart rate of 179 bpm. 1 episode of supraventricular tachycardia, 6 beats in duration, maximum heart rate 164bpm. Total ventricular ectopic burden <1%. Total supraventricular ectopic burden <1%. Patient triggered events: 0.  Biotronik loop recorder implantation 12/16/2020  Remote loop recorder transmission 07/20/2021: Predominant rhythm is normal sinus rhythm.  There is no atrial fibrillation, no heart block. Occasional PAC. Continue monitoring.  LABORATORY DATA: CBC Latest Ref Rng & Units 07/16/2021 04/28/2021 11/30/2020  WBC 3.8 - 10.8 Thousand/uL 4.5 5.3 4.7  Hemoglobin 11.7 - 15.5 g/dL 10.5(L) 10.8(L) 9.8(L)  Hematocrit 35.0 - 45.0 % 29.7(L) 31.7(L) 29.0(L)  Platelets 140 - 400 Thousand/uL 181 149.0(L) 147    CMP Latest Ref Rng & Units 07/16/2021 04/28/2021 03/08/2021  Glucose 65 - 99 mg/dL 240(H) 161(H) 103(H)  BUN 7 - 25 mg/dL _0 Creatinine 0.50 - 1.05 mg/dL 1.68(H) 1.42(H) 1.72(H)  Sodium 135 - 146 mmol/L 138 139 141  Potassium 3.5 - 5.3 mmol/L 4.3 4.4 5.3(H)  Chloride 98 - 110 mmol/L 107 107 105  CO2 20 - 32 mmol/L _1 Calcium 8.6 - 10.4 mg/dL 9.1 9.2 9.7  Total Protein 6.1 - 8.1 g/dL 7.0 7.3 7.5  Total Bilirubin 0.2 - 1.2 mg/dL 0.6 0.6 0.8  Alkaline Phos 39 - 117 U/L - 72 63  AST 10 - 35 U/L _2 ALT 6 - 29 U/L _3 Lipid Panel  Lab Results  Component Value Date   CHOL 128 07/16/2021   HDL 82 07/16/2021   LDLCALC 31 07/16/2021   LDLDIRECT 37 03/08/2021   TRIG 71 07/16/2021   CHOLHDL 1.6 07/16/2021   Lab Results  Component Value Date   HGBA1C 6.3 (H)  11/04/2020   HGBA1C 5.2 06/26/2020   HGBA1C 7.9 (H) 05/11/2019   No components found for: NTPROBNP Lab Results  Component Value Date   TSH 2.01 07/16/2021   TSH 3.642 06/26/2020   TSH 3.53 06/25/2020    Cardiac Panel (last 3 results) No results for input(s): CKTOTAL, CKMB, TROPONINIHS, RELINDX in the last 72 hours.  IMPRESSION:    ICD-10-CM   1. Shortness of breath  R06.02 PCV ECHOCARDIOGRAM COMPLETE    2. Coronary atherosclerosis due to calcified coronary lesion of native artery  I25.10 EKG 12-Lead  I25.84     3. Loop Biotronik loop implant 12/16/2020  Z95.818     4. Hx of stroke involving the right caudate nucleus and right anterolateral thalamus (Jan 2022), Hx of  right parietooccipital stroke (May 2022)  Z86.73     5. Hypertriglyceridemia  E78.1     6. Benign hypertension with CKD (chronic kidney disease) stage III (HCC)  I12.9    N18.30     7. Type 2 diabetes mellitus with stage 3b chronic kidney disease, with long-term current use of insulin (HCC)  E11.22    N18.32    Z79.4     8. Long-term insulin use (HCC)  Z79.4     9. Mixed hyperlipidemia  E78.2     10. Former tobacco use  Z87.891        RECOMMENDATIONS: DALEYSA KRISTIANSEN is a 66 y.o. female whose past medical history and cardiovascular risk factors include: Recent stroke involving the right caudate nucleus and right anterolateral thalamus (March 2022), rectal CA in 2014 that is in remission, T2DM, former tobacco use, emphysema, interstitial lung disease, idiopathic pulmonary fibrosis, and coronary artery calcification on CT scan.    Shortness of breath Progressive. Overall euvolemic and not in congestive heart failure. Clinically I do not think this is secondary to an ischemic substrate-last nuclear stress test February 2021. On physical examination she does have dry crackles bilaterally at the bases.  She also has history of interstitial lung disease/pulmonary fibrosis which may be the underlying root cause of  her symptoms.  I have asked her to follow-up with her pulmonologist for further evaluation and management. In the meantime would like to repeat an echocardiogram to reevaluate LVEF, regional wall motion abnormalities, valvular heart disease, and RVSP for pulmonary hypertension. Loop recorder interrogation reviewed: Normal sinus, no evidence of A-fib. EKG: Nonischemic  Coronary atherosclerosis due to calcified coronary lesion of native artery Prior ischemic work-up reviewed as part of today's office visit Medications reconciled. Echo as discussed above Hold off on additional cardiovascular testing at this time.  Loop Biotronik loop implant 12/16/2020 Last interrogation report reviewed-no A-fib burden. Continue to monitor.  Hx of stroke involving the right caudate nucleus and right anterolateral thalamus (Jan 2022), Hx of  right parietooccipital stroke (May 2022) No recurrence of stroke since last visit. Reemphasized the importance of improving her modifiable cardiovascular risk factors. Blood pressure within acceptable range but currently not at goal.  Given her feeling of being tired and fatigue we will hold off on further up titration of medications at this time. LDL, triglycerides currently at goal.  Hypertriglyceridemia Well controlled. Currently on pharmacological therapy. Last lipid profile independently reviewed.  Benign hypertension with CKD (chronic kidney disease) stage III (HCC) Medications reconciled. Office blood pressures within acceptable range -not at goal. Reemphasized the importance of low-salt diet. We will monitor peripherally.  FINAL MEDICATION LIST END OF ENCOUNTER:  No orders of the defined types were placed in this encounter.     Current Outpatient Medications:  .  acetaminophen (TYLENOL) 325 MG tablet, Take 2 tablets (650 mg total) by mouth every 4 (four) hours as needed for mild pain (or temp > 37.5 C (99.5 F))., Disp: , Rfl:  .  albuterol (VENTOLIN HFA)  108 (90 Base) MCG/ACT inhaler, Inhale 2 puffs into the lungs every 4 (four) hours as needed for wheezing., Disp: 1 g, Rfl: 5 .  amLODipine (NORVASC) 10 MG tablet, Take 1 tablet (10 mg total) by mouth daily., Disp: 30 tablet, Rfl: 2 .  aspirin EC 81 MG EC tablet, Take 1 tablet (81 mg total) by mouth daily. Swallow whole., Disp: 30 tablet, Rfl: 11 .  busPIRone (BUSPAR) 10 MG tablet, Take 1 tablet (10 mg total) by mouth 2 (two) times daily., Disp: 60 tablet, Rfl: 0 .  diclofenac Sodium (VOLTAREN) 1 % GEL, Apply 2 g topically daily as needed (Knee pain)., Disp: 2 g, Rfl: 0 .  famotidine (PEPCID) 20 MG tablet, Take 1 tablet (20 mg total) by mouth daily., Disp: 30 tablet, Rfl: 0 .  fenofibrate (TRICOR) 145 MG tablet, Take 145 mg by mouth daily., Disp: , Rfl:  .  gabapentin (NEURONTIN) 100 MG capsule, Take 2 capsules (200 mg total) by mouth 2 (two) times daily as needed (pain)., Disp: 60 capsule, Rfl: 0 .  insulin glargine (LANTUS SOLOSTAR) 100 UNIT/ML Solostar Pen, Please provide 24 units every a.m. and 20 units every p.m., Disp: 15 mL, Rfl: 11 .  Insulin Pen Needle (PEN NEEDLES) 32G X 6 MM MISC, 1 application by Does not apply route 2 (two) times daily., Disp: 100 each, Rfl: 1 .  losartan (COZAAR) 50 MG tablet, Take 1 tablet (50 mg total) by mouth daily., Disp: 30 tablet, Rfl: 0 .  Multiple Vitamin (MULTIVITAMIN ADULT PO), Take 1 tablet by mouth daily., Disp: , Rfl:  .  Pirfenidone (ESBRIET) 801 MG TABS, Take 801 mg by mouth with breakfast, with lunch, and with evening meal., Disp: 270 tablet, Rfl: 1 .  rosuvastatin (CRESTOR) 20 MG tablet, Take 1 tablet (20 mg total) by mouth daily., Disp: 30 tablet, Rfl: 0 .  SYMBICORT 160-4.5 MCG/ACT inhaler, Inhale 2 puffs into the lungs 2 (two) times daily., Disp: 1 each, Rfl: 5 .  Tiotropium Bromide Monohydrate (SPIRIVA RESPIMAT) 1.25 MCG/ACT AERS, Inhale 2 puffs into the lungs daily., Disp: 4 g, Rfl: 5 .  traMADol (ULTRAM) 50 MG tablet, Take 1 tablet (50 mg total)  by mouth every 8 (eight) hours as needed for severe pain., Disp: 30 tablet, Rfl: 0 .  venlafaxine XR (EFFEXOR-XR) 75 MG 24 hr capsule, Take 1 capsule (75 mg total) by mouth daily with breakfast., Disp: 30 capsule, Rfl: 0 .  vitamin B-12 (CYANOCOBALAMIN) 500 MCG tablet, Take 500 mcg by mouth daily., Disp: , Rfl:   Orders Placed This Encounter  Procedures  . EKG 12-Lead  . PCV ECHOCARDIOGRAM COMPLETE    --Continue cardiac medications as reconciled in final medication list. --Return in about 4 weeks (around 09/15/2021) for Follow up, Dyspnea. Or sooner if needed. --Continue follow-up with your primary care physician regarding the management of your other chronic comorbid conditions.  Patient's questions and concerns were addressed to her satisfaction. She voices understanding of the instructions provided during this encounter.   This note was created using a voice recognition software as a result there may be grammatical errors inadvertently enclosed that do not reflect the nature of this encounter. Every attempt is made to correct such errors.  Rex Kras, [place], [facility]  Pager: [phone] Office: [phone]

## 2021-08-25 ENCOUNTER — Ambulatory Visit: Payer: Medicare HMO | Admitting: Physical Therapy

## 2021-08-30 ENCOUNTER — Other Ambulatory Visit: Payer: Self-pay

## 2021-08-30 ENCOUNTER — Ambulatory Visit: Payer: Medicare HMO

## 2021-08-30 DIAGNOSIS — R0602 Shortness of breath: Secondary | ICD-10-CM

## 2021-09-01 ENCOUNTER — Other Ambulatory Visit: Payer: Self-pay

## 2021-09-01 ENCOUNTER — Encounter: Payer: Self-pay | Admitting: Physical Therapy

## 2021-09-01 ENCOUNTER — Telehealth: Payer: Self-pay | Admitting: Adult Health

## 2021-09-01 ENCOUNTER — Ambulatory Visit: Payer: Medicare HMO | Admitting: Physical Therapy

## 2021-09-01 VITALS — BP 180/100 | HR 76

## 2021-09-01 DIAGNOSIS — M6281 Muscle weakness (generalized): Secondary | ICD-10-CM

## 2021-09-01 DIAGNOSIS — R2689 Other abnormalities of gait and mobility: Secondary | ICD-10-CM

## 2021-09-01 DIAGNOSIS — R2681 Unsteadiness on feet: Secondary | ICD-10-CM

## 2021-09-01 NOTE — Therapy (Signed)
OUTPATIENT PHYSICAL THERAPY TREATMENT NOTE/Recert   Patient Name: Erica [name] MRN: [id number] DOB:[birth year], 66 y.o., female Today's Date: 09/01/2021  PCP: Nolene Ebbs, MD REFERRING PROVIDER: [name]   PT End of Session - 09/01/21 1405     Visit Number 26   no change, arrived no charge   Number of Visits 32    Date for PT Re-Evaluation 10/08/21    Authorization Type [employer]    Authorization Time Period 80 visists from 11/8-12/30/22; new approval from 12/29-2/17/23; 08/17/21- 10/08/21 8 visits    Authorization - Visit Number 1   no change on date 09/01/21, arrived no charge   Authorization - Number of Visits 8    Progress Note Due on Visit 10    PT Start Time 1402    PT Stop Time 1425    PT Time Calculation (min) 23 min    Equipment Utilized During Treatment --    Activity Tolerance Patient tolerated treatment well;Treatment limited secondary to medical complications (Comment)    Behavior During Therapy [facility] for tasks assessed/performed             Past Medical History:  Diagnosis Date   Arthritis    especially in shoulders   Asthma    Depression    Diabetes mellitus    Embolic stroke ([place]) 9/78/4784   GERD (gastroesophageal reflux disease)    Headache(784.0)    "mild"   Hypertension    Loop Biotronik loop implant 12/16/2020 12/16/2020   Mental disorder    depression   Neuropathy    feet    Pain    arthritis pain - takes tramadol as needed   Rectal polyp    very little bleeding with bowel movements- no pain   Stroke ([place]) 11/03/2020   Suicide attempt [facility])    Past Surgical History:  Procedure Laterality Date   ANAL RECTAL MANOMETRY N/A 07/13/2016   Procedure: ANO RECTAL MANOMETRY;  Surgeon: Leighton Ruff, MD;  [facility];  Service: Endoscopy;  Laterality: N/A;   CESAREAN SECTION     EUS N/A 11/21/2012   Procedure: LOWER ENDOSCOPIC ULTRASOUND (EUS);  Surgeon: Arta Silence, MD;  Location[facility];  Service: Endoscopy;   Laterality: N/A;   FLEXIBLE SIGMOIDOSCOPY N/A 11/21/2012   Procedure: FLEXIBLE SIGMOIDOSCOPY;  Surgeon: Arta Silence, MD;  [facility];  Service: Endoscopy;  Laterality: N/A;   FLEXIBLE SIGMOIDOSCOPY N/A 01/28/2013   Procedure: FLEXIBLE SIGMOIDOSCOPY;  Surgeon: Leighton Ruff, MD;  [facility];  Service: Endoscopy;  Laterality: N/A;   LAPAROSCOPIC LOW ANTERIOR RESECTION N/A 01/29/2013   Procedure: LAPAROSCOPIC LOW ANTERIOR RESECTION, Rigid Proctoscopy;  Surgeon: Leighton Ruff, MD;  [facility];  Service: General;  Laterality: N/A;   LAPAROSCOPIC SIGMOID COLECTOMY N/A 11/14/2012   Procedure: DIAGNOSTIC LAPAROSCOPY AND SIGMOIDMOIDOSCOPY ;  Surgeon: Rolm Bookbinder, MD;  [facility];  Service: General;  Laterality: N/A;   RECTAL ULTRASOUND N/A 07/13/2016   Procedure: RECTAL ULTRASOUND;  Surgeon: Leighton Ruff, MD;  [facility];  Service: Endoscopy;  Laterality: N/A;   TONSILLECTOMY     TONSILLECTOMY AND ADENOIDECTOMY     Patient Active Problem List   Diagnosis Date Noted   Encounter for loop recorder check 01/02/2021   Loop Biotronik loop implant 12/16/2020 12/16/2020   Leukopenia    Controlled type 2 diabetes mellitus with hyperglycemia, with long-term current use of insulin (HCC)    Vascular headache    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[place]) 12/82/0813   Embolic stroke ([place]) 88/71/9597  Sinus bradycardia on ECG 11/03/2020   Benign essential HTN    Pancytopenia (HCC)    Stage 3b chronic kidney disease (HCC)    Anemia    Labile blood glucose    Labile blood pressure    Diabetic peripheral neuropathy (HCC)    Essential hypertension    AKI (acute kidney injury) ([place])    Ataxia, late effect of cerebrovascular disease    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[place]) 06/30/2020   Acute CVA (cerebrovascular accident) ([place]) 06/26/2020   CVA (cerebral vascular accident) ([place]) 06/25/2020   BMI 32.0-32.9,adult 06/03/2020   Left upper lobe pneumonia 03/30/2020   Chest pain  10/01/2019   MDD (major depressive disorder), recurrent severe, without psychosis ([place]) 05/09/2019   Suicide attempt ([place]) 05/09/2019   Cocaine abuse ([place]) 05/09/2019   Interstitial lung disease ([place]) 06/21/2016   COPD (chronic obstructive pulmonary disease) ([place]) 06/21/2016   Cough 06/21/2016   GERD (gastroesophageal reflux disease) 06/21/2016   Hypersomnia 06/21/2016   Tobacco user 06/21/2016   Rectal cancer ([place]) 01/30/2013    REFERRING DIAG: gait disturbance, post-stroke  THERAPY DIAG:  Other abnormalities of gait and mobility  Muscle weakness (generalized)  Unsteadiness on feet  PERTINENT HISTORY: arthritis, asthma, depression, HTN, neuropathy, suicide attempt, DM   PRECAUTIONS: fall  SUBJECTIVE: Pt reports that she is doing a little better. Doctor started her on vitamin B12 and if energy does not improve will have to go to a shot.  PAIN:  Are you having pain? No  Vitals:   09/01/21 1409 09/01/21 1417  BP: (!) 166/101 (!) 180/100  Pulse: 76      TODAY'S TREATMENT:  No treatment this session due to elevated BP.      PATIENT EDUCATION: Education details: Continue with current HEP Person educated: Patient Education method: Explanation Education comprehension: verbalized understanding   HOME EXERCISE PROGRAM: [id number]   PT Short Term Goals - 08/17/21 0916       PT SHORT TERM GOAL #1   Title Pt will increase Berg from 49/56 to >53/56 for improved balance and decreased fall risk.    Baseline 04/20/21 49/56 > decreased to 46/56. 08/17/21 44/56    Time 4    Period Weeks    Status On-going    Target Date 09/14/21      PT SHORT TERM GOAL #2   Title Pt will ambulate up/down curb and ramp with cane mod I for improved community access.    Baseline 05/27/21 supervision on ramp and curb    Time 4    Period Weeks    Status On-going    Target Date 09/14/21      PT SHORT TERM GOAL #3   Title Pt will increase gait speed to >0.50ms with cane for improved  community mobility.    Baseline 0.687m > declined slightly to .625 m/sec. 05/27/21 0.726mwith cane. 08/17/21 0.27m78mith cane    Time 4    Period Weeks    Status On-going    Target Date 09/14/21              PT Long Term Goals - 08/17/21 0917       PT LONG TERM GOAL #1   Title Pt will ambulate >500' on varied level surfaces mod I with cane with quad tip for improved short community distances.(LTGs due 10/08/21)    Baseline 08/17/21 Pt ambulated 230' on level surfaces with cane with quad tip supervision/CGA as fatigued    Time 8    Period Weeks  Status On-going    Target Date 10/08/21      PT LONG TERM GOAL #2   Title Pt will increase DGI from 14/24 to >19/24 for improved balance and gait safety.    Baseline 04/20/21 14/24. 06/03/21 17/24. 08/17/21 13/24    Time 8    Period Weeks    Status On-going    Target Date 10/08/21      PT LONG TERM GOAL #3   Title Pt will ambulate up/down 4 steps with cane in reciprocal pattern for improved community access and functional strength.    Baseline 06/03/21 able to perform with rail in reciprocal pattern. 08/17/21 reciprocal pattern with bilateral rails    Time 8    Period Weeks    Status On-going    Target Date 10/08/21      PT LONG TERM GOAL #4   Title Pt will be able to complete 6 min walk without resting for improved activity tolerance.    Baseline had to rest after 2 min 45 sec. 08/17/21 Had to rest at 2 min 37 sec. Performed with cane    Time 8    Period Weeks    Status On-going    Target Date 10/08/21      PT LONG TERM GOAL #5   Title FOTO will be completed at d/c    Baseline 06/03/21 62    Time 8    Period Weeks    Status On-going    Target Date 10/08/21              Plan - 08/17/21 0933     Clinical Impression Statement Pt with elevated BP today and session cancelled. Pt reports being out of 2 of her BP meds that should be at her house today. Appointment rescheduled for Friday of this week.    Personal Factors and  Comorbidities Comorbidity 3+    Comorbidities arthritis, asthma, depression, HTN, neuropathy, suicide attempt, DM    Examination-Activity Limitations Locomotion Level;Transfers;Stand;Stairs;Squat    Examination-Participation Restrictions Community Activity;Driving;Yard Work;Cleaning    Stability/Clinical Decision Making Evolving/Moderate complexity    Rehab Potential Good    PT Frequency 1x / week    PT Duration 8 weeks    PT Treatment/Interventions ADLs/Self Care Home Management;DME Instruction;Neuromuscular re-education;Manual techniques;Balance training;Therapeutic exercise;Therapeutic activities;Functional mobility training;Stair training;Gait training;Vestibular;Passive range of motion;Orthotic Fit/Training;Patient/family education    PT Next Visit Plan continue LLE strengthening, SLS LLE, static and dynamic balance training. Gait/stair training with cane with quad tip. Curb/ramp training. Step-ups with LLE working on control. NuStep or SciFit to try to improve activity tolerance. Will only go the whole 8 weeks if showing improvement at 4 week check in.    Consulted and Agree with Plan of Care Patient              Willow Ora, PTA, [place] [address] [place], [place] [address] [phone] 09/01/21, 2:27 PM

## 2021-09-01 NOTE — Telephone Encounter (Signed)
Unable to reach pt by phone, sent mychart message informing her to call us back and r/s 5/18 appointment- Janett Billow on vacation.

## 2021-09-02 ENCOUNTER — Ambulatory Visit: Payer: Medicare HMO | Admitting: Internal Medicine

## 2021-09-03 ENCOUNTER — Other Ambulatory Visit: Payer: Self-pay

## 2021-09-03 ENCOUNTER — Ambulatory Visit: Payer: Medicare HMO

## 2021-09-03 VITALS — BP 152/84

## 2021-09-03 DIAGNOSIS — R2689 Other abnormalities of gait and mobility: Secondary | ICD-10-CM

## 2021-09-03 DIAGNOSIS — M6281 Muscle weakness (generalized): Secondary | ICD-10-CM

## 2021-09-03 DIAGNOSIS — R2681 Unsteadiness on feet: Secondary | ICD-10-CM

## 2021-09-03 NOTE — Therapy (Signed)
OUTPATIENT PHYSICAL THERAPY TREATMENT NOTE/Recert   Patient Name: Erica [name] MRN: [id number] DOB:1955-07-29, 66 y.o., female Today's Date: 09/03/2021  PCP: Nolene Ebbs, MD REFERRING PROVIDER: [name]   PT End of Session - 09/03/21 1232     Visit Number 27    Number of Visits 33    Date for PT Re-Evaluation 10/08/21    Authorization Type [employer]    Authorization Time Period 16 visists from 11/8-12/30/22; new approval from 12/29-2/17/23; 08/17/21- 10/08/21 8 visits    Authorization - Visit Number 2   no change on date 09/01/21, arrived no charge   Authorization - Number of Visits 8    Progress Note Due on Visit 20    PT Start Time 1230    PT Stop Time 1310    PT Time Calculation (min) 40 min    Equipment Utilized During Treatment Gait belt    Activity Tolerance Patient tolerated treatment well    Behavior During Therapy [facility] for tasks assessed/performed             Past Medical History:  Diagnosis Date   Arthritis    especially in shoulders   Asthma    Depression    Diabetes mellitus    Embolic stroke ([place]) 4/43/1540   GERD (gastroesophageal reflux disease)    Headache(784.0)    "mild"   Hypertension    Loop Biotronik loop implant 12/16/2020 12/16/2020   Mental disorder    depression   Neuropathy    feet    Pain    arthritis pain - takes tramadol as needed   Rectal polyp    very little bleeding with bowel movements- no pain   Stroke ([place]) 11/03/2020   Suicide attempt [facility])    Past Surgical History:  Procedure Laterality Date   ANAL RECTAL MANOMETRY N/A 07/13/2016   Procedure: ANO RECTAL MANOMETRY;  Surgeon: Leighton Ruff, MD;  [facility];  Service: Endoscopy;  Laterality: N/A;   CESAREAN SECTION     EUS N/A 11/21/2012   Procedure: LOWER ENDOSCOPIC ULTRASOUND (EUS);  Surgeon: Arta Silence, MD;  [facility];  Service: Endoscopy;  Laterality: N/A;   FLEXIBLE SIGMOIDOSCOPY N/A 11/21/2012   Procedure: FLEXIBLE SIGMOIDOSCOPY;   Surgeon: Arta Silence, MD;  [facility];  Service: Endoscopy;  Laterality: N/A;   FLEXIBLE SIGMOIDOSCOPY N/A 01/28/2013   Procedure: FLEXIBLE SIGMOIDOSCOPY;  Surgeon: Leighton Ruff, MD;  [facility];  Service: Endoscopy;  Laterality: N/A;   LAPAROSCOPIC LOW ANTERIOR RESECTION N/A 01/29/2013   Procedure: LAPAROSCOPIC LOW ANTERIOR RESECTION, Rigid Proctoscopy;  Surgeon: Leighton Ruff, MD;  [facility];  Service: General;  Laterality: N/A;   LAPAROSCOPIC SIGMOID COLECTOMY N/A 11/14/2012   Procedure: DIAGNOSTIC LAPAROSCOPY AND SIGMOIDMOIDOSCOPY ;  Surgeon: Rolm Bookbinder, MD;  [facility];  Service: General;  Laterality: N/A;   RECTAL ULTRASOUND N/A 07/13/2016   Procedure: RECTAL ULTRASOUND;  Surgeon: Leighton Ruff, MD;  [facility];  Service: Endoscopy;  Laterality: N/A;   TONSILLECTOMY     TONSILLECTOMY AND ADENOIDECTOMY     Patient Active Problem List   Diagnosis Date Noted   Encounter for loop recorder check 01/02/2021   Loop Biotronik loop implant 12/16/2020 12/16/2020   Leukopenia    Controlled type 2 diabetes mellitus with hyperglycemia, with long-term current use of insulin (HCC)    Vascular headache    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[place]) 08/67/6195   Embolic stroke ([place]) 09/32/6712   Sinus bradycardia on ECG 11/03/2020   Benign essential HTN  Pancytopenia ([place])    Stage 3b chronic kidney disease ([place])    Anemia    Labile blood glucose    Labile blood pressure    Diabetic peripheral neuropathy (HCC)    Essential hypertension    AKI (acute kidney injury) ([place])    Ataxia, late effect of cerebrovascular disease    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[place]) 06/30/2020   Acute CVA (cerebrovascular accident) ([place]) 06/26/2020   CVA (cerebral vascular accident) ([place]) 06/25/2020   BMI 32.0-32.9,adult 06/03/2020   Left upper lobe pneumonia 03/30/2020   Chest pain 10/01/2019   MDD (major depressive disorder), recurrent severe, without psychosis ([place])  05/09/2019   Suicide attempt ([place]) 05/09/2019   Cocaine abuse ([place]) 05/09/2019   Interstitial lung disease ([place]) 06/21/2016   COPD (chronic obstructive pulmonary disease) ([place]) 06/21/2016   Cough 06/21/2016   GERD (gastroesophageal reflux disease) 06/21/2016   Hypersomnia 06/21/2016   Tobacco user 06/21/2016   Rectal cancer ([place]) 01/30/2013    REFERRING DIAG: gait disturbance, post-stroke  THERAPY DIAG:  Other abnormalities of gait and mobility  Muscle weakness (generalized)  Unsteadiness on feet  PERTINENT HISTORY: arthritis, asthma, depression, HTN, neuropathy, suicide attempt, DM   PRECAUTIONS: fall  SUBJECTIVE: Pt reports that she had a fall last night when got up to go to the bathroom. Was not using walker and ended up on side of bed on right knee. Knee is a little sore but was able to get herself up. Not sure what happened as was half asleep. She still has not received her BP meds.  PAIN:  Are you having pain? Yes NPRS scale: 2/10 Pain location: right knee Pain orientation: Right  PAIN TYPE: acute Pain description:  sore   Aggravating factors: fall last night    Vitals:   09/03/21 1236  BP: (!) 152/84      TODAY'S TREATMENT:   09/03/21: Standing with cane support: tapping 4" step with LLE x 10 with cues to pick foot off and not slide. Then performed x 10 stepping up with LLE CGA with cues to shift over left leg. Stepping over and back 2x4" bolster with cane support x 10 each leg. Verbal cues to stay up tall on stance leg to really clear foot when stepping back. Standing with cane support marching in place x 13 each leg with 3# on RLE and 2# on LLE with cues to go slow and controlled and try to increase clearance on left. Gait with cane with quad tip 115' with pt wearing her left foot up brace. Cued to really pick up foot from hip and focus on heel strike which was much improved. After seated rest walked another 115'. Pt has decreased left stance time. BP=168/92  after activity. Rested a couple minutes and BP was 158/88.   PATIENT EDUCATION: Education details: Continue with current HEP. Advised to use walker when gets up at night. Person educated: Patient Education method: Explanation Education comprehension: verbalized understanding   HOME EXERCISE PROGRAM: [id number]   PT Short Term Goals - 08/17/21 0916       PT SHORT TERM GOAL #1   Title Pt will increase Berg from 49/56 to >53/56 for improved balance and decreased fall risk.    Baseline 04/20/21 49/56 > decreased to 46/56. 08/17/21 44/56    Time 4    Period Weeks    Status On-going    Target Date 09/14/21      PT SHORT TERM GOAL #2   Title Pt will ambulate up/down curb and  ramp with cane mod I for improved community access.    Baseline 05/27/21 supervision on ramp and curb    Time 4    Period Weeks    Status On-going    Target Date 09/14/21      PT SHORT TERM GOAL #3   Title Pt will increase gait speed to >0.37ms with cane for improved community mobility.    Baseline 0.616m > declined slightly to .625 m/sec. 05/27/21 0.7282mwith cane. 08/17/21 0.54m52mith cane    Time 4    Period Weeks    Status On-going    Target Date 09/14/21              PT Long Term Goals - 08/17/21 0917       PT LONG TERM GOAL #1   Title Pt will ambulate >500' on varied level surfaces mod I with cane with quad tip for improved short community distances.(LTGs due 10/08/21)    Baseline 08/17/21 Pt ambulated 230' on level surfaces with cane with quad tip supervision/CGA as fatigued    Time 8    Period Weeks    Status On-going    Target Date 10/08/21      PT LONG TERM GOAL #2   Title Pt will increase DGI from 14/24 to >19/24 for improved balance and gait safety.    Baseline 04/20/21 14/24. 06/03/21 17/24. 08/17/21 13/24    Time 8    Period Weeks    Status On-going    Target Date 10/08/21      PT LONG TERM GOAL #3   Title Pt will ambulate up/down 4 steps with cane in reciprocal pattern for improved  community access and functional strength.    Baseline 06/03/21 able to perform with rail in reciprocal pattern. 08/17/21 reciprocal pattern with bilateral rails    Time 8    Period Weeks    Status On-going    Target Date 10/08/21      PT LONG TERM GOAL #4   Title Pt will be able to complete 6 min walk without resting for improved activity tolerance.    Baseline had to rest after 2 min 45 sec. 08/17/21 Had to rest at 2 min 37 sec. Performed with cane    Time 8    Period Weeks    Status On-going    Target Date 10/08/21      PT LONG TERM GOAL #5   Title FOTO will be completed at d/c    Baseline 06/03/21 62    Time 8    Period Weeks    Status On-going    Target Date 10/08/21              Plan - 08/17/21 0933     Clinical Impression Statement Pt had still not received her meds in mail yet so closely monitored BP. Was better than last visit. She was dragging left foot upon walking in. Improved throughout session after work on picking up from hip more.   Personal Factors and Comorbidities Comorbidity 3+    Comorbidities arthritis, asthma, depression, HTN, neuropathy, suicide attempt, DM    Examination-Activity Limitations Locomotion Level;Transfers;Stand;Stairs;Squat    Examination-Participation Restrictions Community Activity;Driving;Yard Work;Cleaning    Stability/Clinical Decision Making Evolving/Moderate complexity    Rehab Potential Good    PT Frequency 1x / week    PT Duration 8 weeks    PT Treatment/Interventions ADLs/Self Care Home Management;DME Instruction;Neuromuscular re-education;Manual techniques;Balance training;Therapeutic exercise;Therapeutic activities;Functional mobility training;Stair training;Gait training;Vestibular;Passive range of motion;Orthotic Fit/Training;Patient/family  education    PT Next Visit Plan continue LLE strengthening, SLS LLE, static and dynamic balance training. Gait/stair training with cane with quad tip. Curb/ramp training. Step-ups with LLE  working on control. NuStep or SciFit to try to improve activity tolerance.    Consulted and Agree with Plan of Care Patient              Cherly Anderson, PT, DPT, [place] [address], [place], [place] [address] [phone] 09/03/21, 1:21 PM

## 2021-09-05 ENCOUNTER — Other Ambulatory Visit: Payer: Self-pay | Admitting: Cardiology

## 2021-09-08 ENCOUNTER — Ambulatory Visit: Payer: Medicare HMO | Admitting: Physical Therapy

## 2021-09-09 ENCOUNTER — Ambulatory Visit: Payer: Medicare HMO | Admitting: Physical Therapy

## 2021-09-09 ENCOUNTER — Encounter: Payer: Self-pay | Admitting: Physical Therapy

## 2021-09-09 VITALS — BP 160/89 | HR 88

## 2021-09-09 DIAGNOSIS — R2689 Other abnormalities of gait and mobility: Secondary | ICD-10-CM

## 2021-09-09 DIAGNOSIS — R2681 Unsteadiness on feet: Secondary | ICD-10-CM

## 2021-09-09 DIAGNOSIS — M6281 Muscle weakness (generalized): Secondary | ICD-10-CM

## 2021-09-09 NOTE — Therapy (Signed)
OUTPATIENT PHYSICAL THERAPY TREATMENT NOTE/Recert   Patient Name: Erica [name] MRN: [id number] DOB:1956-01-29, 66 y.o., female Today's Date: 09/09/2021  PCP: Nolene Ebbs, MD REFERRING PROVIDER: [name]   PT End of Session - 09/09/21 1322     Visit Number 28    Number of Visits 33    Date for PT Re-Evaluation 10/08/21    Authorization Type [employer]    Authorization Time Period 97 visists from 11/8-12/30/22; new approval from 12/29-2/17/23; 08/17/21- 10/08/21 8 visits    Authorization - Visit Number 3    Authorization - Number of Visits 8    Progress Note Due on Visit 58    PT Start Time 2993    PT Stop Time 1400    PT Time Calculation (min) 43 min    Equipment Utilized During Treatment Gait belt    Activity Tolerance Patient tolerated treatment well    Behavior During Therapy WFL for tasks assessed/performed             Past Medical History:  Diagnosis Date   Arthritis    especially in shoulders   Asthma    Depression    Diabetes mellitus    Embolic stroke ([place]) 12/26/9676   GERD (gastroesophageal reflux disease)    Headache(784.0)    "mild"   Hypertension    Loop Biotronik loop implant 12/16/2020 12/16/2020   Mental disorder    depression   Neuropathy    feet    Pain    arthritis pain - takes tramadol as needed   Rectal polyp    very little bleeding with bowel movements- no pain   Stroke ([place]) 11/03/2020   Suicide attempt [facility])    Past Surgical History:  Procedure Laterality Date   ANAL RECTAL MANOMETRY N/A 07/13/2016   Procedure: ANO RECTAL MANOMETRY;  Surgeon: Leighton Ruff, MD;  Location: [facility] ENDOSCOPY;  Service: Endoscopy;  Laterality: N/A;   CESAREAN SECTION     EUS N/A 11/21/2012   Procedure: LOWER ENDOSCOPIC ULTRASOUND (EUS);  Surgeon: Arta Silence, MD;  Location: [facility];  Service: Endoscopy;  Laterality: N/A;   FLEXIBLE SIGMOIDOSCOPY N/A 11/21/2012   Procedure: FLEXIBLE SIGMOIDOSCOPY;  Surgeon: Arta Silence, MD;  Location: [facility]  ENDOSCOPY;  Service: Endoscopy;  Laterality: N/A;   FLEXIBLE SIGMOIDOSCOPY N/A 01/28/2013   Procedure: FLEXIBLE SIGMOIDOSCOPY;  Surgeon: Leighton Ruff, MD;  Location: [facility];  Service: Endoscopy;  Laterality: N/A;   LAPAROSCOPIC LOW ANTERIOR RESECTION N/A 01/29/2013   Procedure: LAPAROSCOPIC LOW ANTERIOR RESECTION, Rigid Proctoscopy;  Surgeon: Leighton Ruff, MD;  Location: [facility];  Service: General;  Laterality: N/A;   LAPAROSCOPIC SIGMOID COLECTOMY N/A 11/14/2012   Procedure: DIAGNOSTIC LAPAROSCOPY AND SIGMOIDMOIDOSCOPY ;  Surgeon: Rolm Bookbinder, MD;  Location: [facility];  Service: General;  Laterality: N/A;   RECTAL ULTRASOUND N/A 07/13/2016   Procedure: RECTAL ULTRASOUND;  Surgeon: Leighton Ruff, MD;  Location: [facility] END[facility];  Service: Endoscopy;  Laterality: N/A;   TONSILLECTOMY     TONSILLECTOMY AND ADENOIDECTOMY     Patient Active Problem List   Diagnosis Date Noted   Encounter for loop recorder check 01/02/2021   Loop Biotronik loop implant 12/16/2020 12/16/2020   Leukopenia    Controlled type 2 diabetes mellitus with hyperglycemia, with long-term current use of insulin (HCC)    Vascular headache    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[place]) 93/81/0175   Embolic stroke ([place]) 04/06/8526   Sinus bradycardia on ECG 11/03/2020   Benign essential HTN    Pancytopenia ([place])    Stage  3b chronic kidney disease (HCC)    Anemia    Labile blood glucose    Labile blood pressure    Diabetic peripheral neuropathy (HCC)    Essential hypertension    AKI (acute kidney injury) ([place])    Ataxia, late effect of cerebrovascular disease    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[place]) 06/30/2020   Acute CVA (cerebrovascular accident) ([place]) 06/26/2020   CVA (cerebral vascular accident) ([place]) 06/25/2020   BMI 32.0-32.9,adult 06/03/2020   Left upper lobe pneumonia 03/30/2020   Chest pain 10/01/2019   MDD (major depressive disorder), recurrent severe, without psychosis ([place]) 05/09/2019   Suicide attempt ([place]) 05/09/2019    Cocaine abuse ([place]) 05/09/2019   Interstitial lung disease ([place]) 06/21/2016   COPD (chronic obstructive pulmonary disease) ([place]) 06/21/2016   Cough 06/21/2016   GERD (gastroesophageal reflux disease) 06/21/2016   Hypersomnia 06/21/2016   Tobacco user 06/21/2016   Rectal cancer ([place]) 01/30/2013    REFERRING DIAG: gait disturbance, post-stroke  THERAPY DIAG:  Other abnormalities of gait and mobility  Muscle weakness (generalized)  Unsteadiness on feet  PERTINENT HISTORY: arthritis, asthma, depression, HTN, neuropathy, suicide attempt, DM   PRECAUTIONS: fall  SUBJECTIVE: No new falls. Pt has her cane with her today stating "I forgot my walker".  PAIN:  Are you having pain? No     Vitals:   09/09/21 1325 09/09/21 1351  BP: (!) 152/89 (!) 160/89  Pulse: 87 88       TODAY'S TREATMENT:   09/09/21: STRENGTHENING  Scifit UE/LE's at level 3.5 x 8 minutes with goal >/= 85 steps per minute for strengthening and activity tolerance.    GAIT: Gait pattern: step through pattern, decreased step length- Left, decreased stance time- Right, decreased stride length, decreased ankle dorsiflexion- Left, trunk flexed, and narrow BOS Distance walked: 115 x1, plus around clinic with session Assistive device utilized: Single point cane and foot up brace on left LE Level of assistance:  min guard assist  Comments: cues needed for increased left step length and heel strike with gait. Pt able to return demo for a few steps before needing cues again.    NMR: Standing at bottom of steps with bil UE support on rail: left stance for right foot taps up/down bottom 3 steps to work on right hip strengthening. Then right stance with left foot taps up/down bottom 3 steps to work on right glut/hip strengthening in single leg stance.  Seated at edge of mat in staggered stance with left foot back/right foot forward- sit<>stands x 10 reps with occasional UE assist, min guard to min assist for balance upon  standing.   Standing Static Balance: Surface: Airex Position: Narrow Base of Support Feet Hip Width Apart Completed with: Eyes Closed; EC for 30 seconds x 3 reps with feet close together, then with feet hip width apart for EC Head Turns x 10 Reps and Head Nods x 10 Reps   PATIENT EDUCATION: Education details: continue with current HEP Person educated: Patient Education method: Explanation Education comprehension: verbalized understanding   HOME EXERCISE PROGRAM: [email or web address]   PT Short Term Goals - 08/17/21 0916       PT SHORT TERM GOAL #1   Title Pt will increase Berg from 49/56 to >53/56 for improved balance and decreased fall risk.    Baseline 04/20/21 49/56 > decreased to 46/56. 08/17/21 44/56    Time 4    Period Weeks    Status On-going    Target Date 09/14/21  PT SHORT TERM GOAL #2   Title Pt will ambulate up/down curb and ramp with cane mod I for improved community access.    Baseline 05/27/21 supervision on ramp and curb    Time 4    Period Weeks    Status On-going    Target Date 09/14/21      PT SHORT TERM GOAL #3   Title Pt will increase gait speed to >0.17ms with cane for improved community mobility.    Baseline 0.621m > declined slightly to .625 m/sec. 05/27/21 0.7220mwith cane. 08/17/21 0.97m37mith cane    Time 4    Period Weeks    Status On-going    Target Date 09/14/21              PT Long Term Goals - 08/17/21 0917       PT LONG TERM GOAL #1   Title Pt will ambulate >500' on varied level surfaces mod I with cane with quad tip for improved short community distances.(LTGs due 10/08/21)    Baseline 08/17/21 Pt ambulated 230' on level surfaces with cane with quad tip supervision/CGA as fatigued    Time 8    Period Weeks    Status On-going    Target Date 10/08/21      PT LONG TERM GOAL #2   Title Pt will increase DGI from 14/24 to >19/24 for improved balance and gait safety.    Baseline 04/20/21 14/24. 06/03/21 17/24. 08/17/21 13/24    Time 8     Period Weeks    Status On-going    Target Date 10/08/21      PT LONG TERM GOAL #3   Title Pt will ambulate up/down 4 steps with cane in reciprocal pattern for improved community access and functional strength.    Baseline 06/03/21 able to perform with rail in reciprocal pattern. 08/17/21 reciprocal pattern with bilateral rails    Time 8    Period Weeks    Status On-going    Target Date 10/08/21      PT LONG TERM GOAL #4   Title Pt will be able to complete 6 min walk without resting for improved activity tolerance.    Baseline had to rest after 2 min 45 sec. 08/17/21 Had to rest at 2 min 37 sec. Performed with cane    Time 8    Period Weeks    Status On-going    Target Date 10/08/21      PT LONG TERM GOAL #5   Title FOTO will be completed at d/c    Baseline 06/03/21 62    Time 8    Period Weeks    Status On-going    Target Date 10/08/21              Plan - 08/17/21 0933     Clinical Impression Statement Skilled session continued to focus on strengthening, gait with cane and balance training with no issues noted or reported in session. VSS with session. The pt is making progress and should benefit from continued PT to progress toward unmet goals.    Personal Factors and Comorbidities Comorbidity 3+    Comorbidities arthritis, asthma, depression, HTN, neuropathy, suicide attempt, DM    Examination-Activity Limitations Locomotion Level;Transfers;Stand;Stairs;Squat    Examination-Participation Restrictions Community Activity;Driving;Yard Work;Cleaning    Stability/Clinical Decision Making Evolving/Moderate complexity    Rehab Potential Good    PT Frequency 1x / week    PT Duration 8 weeks    PT Treatment/Interventions ADLs/Self  Care Home Management;DME Instruction;Neuromuscular re-education;Manual techniques;Balance training;Therapeutic exercise;Therapeutic activities;Functional mobility training;Stair training;Gait training;Vestibular;Passive range of motion;Orthotic  Fit/Training;Patient/family education    PT Next Visit Plan continue LLE strengthening, SLS LLE, static and dynamic balance training. Gait/stair training with cane with quad tip. Curb/ramp training. Step-ups with LLE working on control. NuStep or SciFit to try to improve activity tolerance.    Consulted and Agree with Plan of Care Patient              Willow Ora, PTA, [facility] [address], [place], [place] [address] [phone] 09/09/21, 3:48 PM

## 2021-09-15 ENCOUNTER — Ambulatory Visit: Payer: Medicare HMO | Admitting: Physical Therapy

## 2021-09-17 ENCOUNTER — Ambulatory Visit: Payer: Medicare HMO | Admitting: Physical Therapy

## 2021-09-20 ENCOUNTER — Ambulatory Visit: Payer: Medicare HMO | Admitting: Cardiology

## 2021-09-24 ENCOUNTER — Ambulatory Visit: Payer: Medicare HMO | Admitting: Cardiology

## 2021-09-24 ENCOUNTER — Encounter: Payer: Self-pay | Admitting: Cardiology

## 2021-09-24 ENCOUNTER — Other Ambulatory Visit: Payer: Self-pay | Admitting: Cardiology

## 2021-09-24 VITALS — BP 148/83 | HR 88 | Temp 98.0°F | Resp 16 | Ht 63.0 in | Wt 182.0 lb

## 2021-09-24 DIAGNOSIS — E781 Pure hyperglyceridemia: Secondary | ICD-10-CM

## 2021-09-24 DIAGNOSIS — E782 Mixed hyperlipidemia: Secondary | ICD-10-CM

## 2021-09-24 DIAGNOSIS — I129 Hypertensive chronic kidney disease with stage 1 through stage 4 chronic kidney disease, or unspecified chronic kidney disease: Secondary | ICD-10-CM

## 2021-09-24 DIAGNOSIS — Z95818 Presence of other cardiac implants and grafts: Secondary | ICD-10-CM

## 2021-09-24 DIAGNOSIS — N1832 Chronic kidney disease, stage 3b: Secondary | ICD-10-CM

## 2021-09-24 DIAGNOSIS — I2510 Atherosclerotic heart disease of native coronary artery without angina pectoris: Secondary | ICD-10-CM

## 2021-09-24 DIAGNOSIS — N183 Chronic kidney disease, stage 3 unspecified: Secondary | ICD-10-CM

## 2021-09-24 DIAGNOSIS — Z794 Long term (current) use of insulin: Secondary | ICD-10-CM

## 2021-09-24 DIAGNOSIS — R0602 Shortness of breath: Secondary | ICD-10-CM

## 2021-09-24 DIAGNOSIS — Z8673 Personal history of transient ischemic attack (TIA), and cerebral infarction without residual deficits: Secondary | ICD-10-CM

## 2021-09-24 MED ORDER — ROSUVASTATIN CALCIUM 20 MG PO TABS: 20.0000 mg | ORAL_TABLET | Freq: Every day | ORAL | 0 refills | Status: DC

## 2021-09-24 MED ORDER — ENTRESTO 49-51 MG PO TABS: 1.0000 | ORAL_TABLET | Freq: Two times a day (BID) | ORAL | 0 refills | Status: DC

## 2021-09-24 NOTE — Progress Notes (Signed)
Erica [name] Date of Birth: 07/03/1955 MRN: [id number] Primary Care Provider:Avbuere, Christean Grief, MD Former Cardiology Providers: Dr. Adrian Prows, Jeri Lager, APRN, FNP-C Primary Cardiologist: Rex Kras, DO, [facility] (established care 11/19/2019)  Date: 09/24/21 Last Office Visit: 08/18/2021  Chief Complaint  Patient presents with   Follow-up    Shortness of breath Review echo results.     HPI   Erica [name] is a 66 y.o.  female whose past medical history and cardiovascular risk factors include: Hx of stroke involving the right caudate nucleus and right anterolateral thalamus (Jan 2022), Hx of  right parietooccipital stroke (May 2022), rectal CA in 2014 that is in remission, T2DM, former tobacco use, emphysema, interstitial lung disease, idiopathic pulmonary fibrosis, and coronary artery calcification on CT scan.    In March 2023 patient presented to the office for evaluation of shortness of breath sooner than her scheduled appointment.  She is experiencing feeling tired, fatigued, short of breath with effort related activities.  Clinically she was euvolemic on physical examination but given her multiple cardiovascular risk factors that shared decision was to proceed with at least an echocardiogram to reevaluate LVEF and regional wall motion abnormality.  She presents now for 1 month follow-up visit.  She states that the shortness of breath/tired/fatigued all are improving slowly and feels much better compared to a month ago.  Echocardiogram results independently reviewed which still notes preserved LVEF, grade 1 diastolic impairment, moderate LVH, and moderate AI.  Patient brings in her medication bottles and she is quite unorganized and is requesting assistance.  She has 3 bottles of losartan 100 mg p.o. daily, 2 bottles of losartan/hydrochlorothiazide 50/12.5 mg p.o. daily, and an additional 2 bottles of losartan 50 mg p.o. daily.  She has stopped taking rosuvastatin without any particular  cause.  ALLERGIES: Allergies  Allergen Reactions   Penicillins Hives, Itching and Swelling    Tongue swells up Has patient had a PCN reaction causing immediate rash, facial/tongue/throat swelling, SOB or lightheadedness with hypotension: Yes Has patient had a PCN reaction causing severe rash involving mucus membranes or skin necrosis: Yes Has patient had a PCN reaction that required hospitalization Yes Has patient had a PCN reaction occurring within the last 10 years: No If all of the above answers are "NO", then may proceed with Cephalosporin use.      MEDICATION LIST PRIOR TO VISIT: Current Outpatient Medications on File Prior to Visit  Medication Sig Dispense Refill   acetaminophen (TYLENOL) 325 MG tablet Take 2 tablets (650 mg total) by mouth every 4 (four) hours as needed for mild pain (or temp > 37.5 C (99.5 F)).     albuterol (VENTOLIN HFA) 108 (90 Base) MCG/ACT inhaler Inhale 2 puffs into the lungs every 4 (four) hours as needed for wheezing. 1 g 5   amLODipine (NORVASC) 10 MG tablet Take 1 tablet (10 mg total) by mouth daily. 30 tablet 2   aspirin EC 81 MG EC tablet Take 1 tablet (81 mg total) by mouth daily. Swallow whole. 30 tablet 11   diclofenac Sodium (VOLTAREN) 1 % GEL Apply 2 g topically daily as needed (Knee pain). 2 g 0   famotidine (PEPCID) 20 MG tablet Take 1 tablet (20 mg total) by mouth daily. 30 tablet 0   fenofibrate (TRICOR) 145 MG tablet TAKE 1 TABLET(145 MG) BY MOUTH DAILY 90 tablet 3   gabapentin (NEURONTIN) 100 MG capsule Take 2 capsules (200 mg total) by mouth 2 (two) times daily as needed (pain).  60 capsule 0   insulin glargine (LANTUS SOLOSTAR) 100 UNIT/ML Solostar Pen Please provide 24 units every a.m. and 20 units every p.m. 15 mL 11   Insulin Pen Needle (PEN NEEDLES) 32G X 6 MM MISC 1 application by Does not apply route 2 (two) times daily. 100 each 1   Multiple Vitamin (MULTIVITAMIN ADULT PO) Take 1 tablet by mouth daily.     pantoprazole (PROTONIX)  40 MG tablet Take 40 mg by mouth daily.     Pirfenidone (ESBRIET) 801 MG TABS Take 801 mg by mouth with breakfast, with lunch, and with evening meal. 270 tablet 1   SYMBICORT 160-4.5 MCG/ACT inhaler Inhale 2 puffs into the lungs 2 (two) times daily. 1 each 5   Tiotropium Bromide Monohydrate (SPIRIVA RESPIMAT) 1.25 MCG/ACT AERS Inhale 2 puffs into the lungs daily. 4 g 5   traMADol (ULTRAM) 50 MG tablet Take 1 tablet (50 mg total) by mouth every 8 (eight) hours as needed for severe pain. 30 tablet 0   venlafaxine XR (EFFEXOR-XR) 75 MG 24 hr capsule Take 1 capsule (75 mg total) by mouth daily with breakfast. 30 capsule 0   clopidogrel (PLAVIX) 75 MG tablet Take 75 mg by mouth daily.     hydrALAZINE (APRESOLINE) 25 MG tablet Take by mouth.     nortriptyline (PAMELOR) 25 MG capsule Take 25 mg by mouth at bedtime.     sertraline (ZOLOFT) 50 MG tablet Take 50 mg by mouth daily.     No current facility-administered medications on file prior to visit.    PAST MEDICAL HISTORY: Past Medical History:  Diagnosis Date   Arthritis    especially in shoulders   Asthma    Depression    Diabetes mellitus    Embolic stroke ([place]) 02/11/5175   GERD (gastroesophageal reflux disease)    Headache(784.0)    "mild"   Hypertension    Loop Biotronik loop implant 12/16/2020 12/16/2020   Mental disorder    depression   Neuropathy    feet    Pain    arthritis pain - takes tramadol as needed   Rectal polyp    very little bleeding with bowel movements- no pain   Stroke ([place]) 11/03/2020   Suicide attempt ([place])     PAST SURGICAL HISTORY: Past Surgical History:  Procedure Laterality Date   ANAL RECTAL MANOMETRY N/A 07/13/2016   Procedure: ANO RECTAL MANOMETRY;  Surgeon: Leighton Ruff, MD;  Location: [facility];  Service: Endoscopy;  Laterality: N/A;   CESAREAN SECTION     EUS N/A 11/21/2012   Procedure: LOWER ENDOSCOPIC ULTRASOUND (EUS);  Surgeon: Arta Silence, MD;  Location: [facility];  Service:  Endoscopy;  Laterality: N/A;   FLEXIBLE SIGMOIDOSCOPY N/A 11/21/2012   Procedure: FLEXIBLE SIGMOIDOSCOPY;  Surgeon: Arta Silence, MD;  Location: [facility];  Service: Endoscopy;  Laterality: N/A;   FLEXIBLE SIGMOIDOSCOPY N/A 01/28/2013   Procedure: FLEXIBLE SIGMOIDOSCOPY;  Surgeon: Leighton Ruff, MD;  Location: [facility];  Service: Endoscopy;  Laterality: N/A;   LAPAROSCOPIC LOW ANTERIOR RESECTION N/A 01/29/2013   Procedure: LAPAROSCOPIC LOW ANTERIOR RESECTION, Rigid Proctoscopy;  Surgeon: Leighton Ruff, MD;  Location: [facility];  Service: General;  Laterality: N/A;   LAPAROSCOPIC SIGMOID COLECTOMY N/A 11/14/2012   Procedure: DIAGNOSTIC LAPAROSCOPY AND SIGMOIDMOIDOSCOPY ;  Surgeon: Rolm Bookbinder, MD;  Location: [facility];  Service: General;  Laterality: N/A;   RECTAL ULTRASOUND N/A 07/13/2016   Procedure: RECTAL ULTRASOUND;  Surgeon: Leighton Ruff, MD;  Location: [facility];  Service: Endoscopy;  Laterality: N/A;   TONSILLECTOMY     TONSILLECTOMY AND ADENOIDECTOMY      FAMILY HISTORY: The patient's family history includes Cancer in her maternal aunt, maternal uncle, maternal uncle, maternal uncle, paternal aunt, and paternal uncle; Cancer (age of onset: 18) in an other family member; Diabetes in her father; Heart disease in her mother; Hyperlipidemia in her mother; Hypertension in her brother, brother, father, mother, sister, and sister; Stroke in her father.   SOCIAL HISTORY:  The patient  reports that she quit smoking about 3 years ago. Her smoking use included cigarettes. She has a 10.00 pack-year smoking history. She has never used smokeless tobacco. She reports that she does not currently use alcohol. She reports that she does not currently use drugs after having used the following drugs: "Crack" cocaine.  Review of Systems  Constitutional: Positive for malaise/fatigue (improved).  Cardiovascular:  Positive for dyspnea on exertion (improved). Negative for chest pain, leg swelling,  near-syncope, orthopnea, palpitations, paroxysmal nocturnal dyspnea and syncope.  Respiratory:  Positive for shortness of breath (improved).    PHYSICAL EXAM:    09/24/2021   10:02 AM 09/09/2021    1:51 PM 09/09/2021    1:25 PM  Vitals with BMI  Height _0     Weight 182 lbs    BMI 02.58    Systolic 527 782 423  Diastolic 83 89 89  Pulse 88 88 87    CONSTITUTIONAL: Appears older than stated age.  Hemodynamically stable.  well-nourished. No acute distress.  Ambulates with 2 wheel walker SKIN: Skin is warm and dry. No rash noted. No cyanosis. No pallor. No jaundice HEAD: Normocephalic and atraumatic.  EYES: No scleral icterus MOUTH/THROAT: Moist oral membranes.  NECK: No JVD present. No thyromegaly noted. No carotid bruits  CHEST Normal respiratory effort. No intercostal retractions  LUNGS: Clear to auscultation in the upper lung fields with dry crackles noted at bilateral bases, no expiratory wheezing.  Marland Kitchen CARDIOVASCULAR: Regular rate and rhythm, positive S1-S2, no murmurs rubs or gallops appreciated. ABDOMINAL: Obese, soft, nontender, nondistended, positive bowel sounds all 4 quadrants. No apparent ascites.  EXTREMITIES: No peripheral edema. Left ankle brace. HEMATOLOGIC: No significant bruising NEUROLOGIC: Oriented to person, place, and time.  5 out of 5 strength in the right upper and lower extremity.  4 out of 5 strength in the left upper and lower extremity. PSYCHIATRIC: Normal mood and affect. Normal behavior. Cooperative  RADIOLOGY: CT of chest 04/12/2019:  1. The appearance of the lungs remains compatible with interstitial lung disease, with a spectrum of findings considered diagnostic of usual interstitial pneumonia (UIP) per current ATS guidelines. Minimal progression compared to the prior study. 2. Aortic atherosclerosis, in addition to left anterior descending coronary artery disease.  3. Cholelithiasis.  Aortic Atherosclerosis (ICD10-I70.0).  CT head without contrast  06/25/2020: 1. Lacunar infarcts in the right caudate and right cerebellum, new from 2012, but age indeterminate. 2. No hemorrhage or evidence of large or territorial ischemia. 3. Chronic left sphenoid sinusitis.  MRI brain without contrast 06/25/2020: 1. 1 cm acute infarction in the anterolateral thalamus on the right, possibly with some involvement of the posterior limb internal capsule. Acute infarction within the right caudate head. Scattered other foci of high signal on diffusion imaging within the hemispheric white matter, not showing restricted diffusion on the ADC map, and therefore probably representing T2 shine through from old white matter infarctions. 2. Extensive old small vessel infarctions throughout the brain as outlined above. Small old cortical infarctions in the  right frontal lobe and both parieto-occipital regions. 3. No large or medium vessel occlusion or correctable proximal stenosis. Mild atherosclerotic irregularity of the PCA branches.  MR cervical spine without contrast 06/26/2020 1. 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 Mild generalized atrophy of the upper cervical spinal cord. 2. Moderate C6-7 and mild C5-6 and C7-T1 spinal canal stenosis secondary to central disc protrusions.   MR Brain Wo Contrast (neuro protocol) Result Date: 11/03/2020 IMPRESSION: Newly seen punctate acute infarction at the medial right parietooccipital junction. No other change since the most recent exam of January. Chronic small-vessel ischemic changes of the pons, cerebellum, thalami, basal ganglia and hemispheric white matter. Old cortical and subcortical infarctions in the occipital regions and frontoparietal regions.  CARDIAC DATABASE: EKG: 09/24/2021: NSR, 75 bpm, LAE, T wave inversions in the high lateral leads suggestive of possible ischemia.  Without underlying injury pattern.  Similar findings on prior EKG.  Echocardiogram: 11/04/2020:  LVEF  60-65%, moderate LVH, grade 1 diastolic impairment, elevated LAP, normal right ventricular size and function, mild AR.  08/30/2021: Left ventricle cavity is normal in size. Moderate concentric hypertrophy of the left ventricle. Normal LV systolic function with EF 56%. Normal global wall motion. Doppler evidence of grade I (impaired) diastolic dysfunction, normal LAP.  Left atrial cavity is mildly dilated. Trileaflet aortic valve. Mild aortic valve leaflet calcification. Moderate (Grade II) aortic regurgitation. Trace pericardial effusion. No significant change compared to previous study on 07/16/2019.   Stress Testing:  Lexiscan tetrofosmin stress test 07/22/2019: There is  apical thinning an mild soft tissue attenuation artifact in inferior wall without ischemia or infarct. All segments of left ventricle demonstrated normal wall motion and thickening. Stress LV EF is moderately dysfunctional 39%, however visually appearted to be normal. No previous exam available for comparison. Low risk study.   Carotid Duplex 11/05/2020: Right Carotid: The extracranial vessels were near-normal with only minimal wall thickening or plaque.  Left Carotid: The extracranial vessels were near-normal with only minimal wall thickening or plaque.  Vertebrals: Bilateral vertebral arteries demonstrate antegrade flow.   Heart Catheterization: None  14 day extended Holter monitor: Dominant rhythm normal sinus rhythm Heart rate 44-179 bpm.  Avg HR 69 bpm. No atrial fibrillation, high grade AV block, pauses (3 seconds or longer). 1 episode of nonsustained ventricular tachycardia, 13 beats in duration, max heart rate of 179 bpm. 1 episode of supraventricular tachycardia, 6 beats in duration, maximum heart rate 164bpm. Total ventricular ectopic burden <1%. Total supraventricular ectopic burden <1%. Patient triggered events: 0.  Biotronik loop recorder implantation 12/16/2020  Remote loop recorder transmission  08/20/2021: Predominant rhythm is normal sinus rhythm. There is no atrial fibrillation, no heart block. Occasional PAC. Continue monitoring.  LABORATORY DATA:    Latest Ref Rng & Units 07/16/2021   12:00 AM 04/28/2021   11:59 AM 11/30/2020    3:05 PM  CBC  WBC 3.8 - 10.8 Thousand/uL 4.5   5.3   4.7    Hemoglobin 11.7 - 15.5 g/dL 10.5   10.8   9.8    Hematocrit 35.0 - 45.0 % 29.7   31.7   29.0    Platelets 140 - 400 Thousand/uL 181   149.0   147         Latest Ref Rng & Units 07/16/2021   12:00 AM 04/28/2021   11:59 AM 03/08/2021   11:44 AM  CMP  Glucose 65 - 99 mg/dL 240   161   103  BUN 7 - 25 mg/dL _0 Creatinine 0.50 - 1.05 mg/dL 1.68   1.42   1.72    Sodium 135 - 146 mmol/L 138   139   141    Potassium 3.5 - 5.3 mmol/L 4.3   4.4   5.3    Chloride 98 - 110 mmol/L 107   107   105    CO2 20 - 32 mmol/L _1 Calcium 8.6 - 10.4 mg/dL 9.1   9.2   9.7    Total Protein 6.1 - 8.1 g/dL 7.0   7.3   7.5    Total Bilirubin 0.2 - 1.2 mg/dL 0.6   0.6   0.8    Alkaline Phos 39 - 117 U/L  72   63    AST 10 - 35 U/L _2 ALT 6 - 29 U/L _3 Lipid Panel  Lab Results  Component Value Date   CHOL 128 07/16/2021   HDL 82 07/16/2021   LDLCALC 31 07/16/2021   LDLDIRECT 37 03/08/2021   TRIG 71 07/16/2021   CHOLHDL 1.6 07/16/2021   Lab Results  Component Value Date   HGBA1C 6.3 (H) 11/04/2020   HGBA1C 5.2 06/26/2020   HGBA1C 7.9 (H) 05/11/2019   No components found for: NTPROBNP Lab Results  Component Value Date   TSH 2.01 07/16/2021   TSH 3.642 06/26/2020   TSH 3.53 06/25/2020    Cardiac Panel (last 3 results) No results for input(s): CKTOTAL, CKMB, TROPONINIHS, RELINDX in the last 72 hours.  IMPRESSION:    ICD-10-CM   1. Shortness of breath  R06.02 rosuvastatin (CRESTOR) 20 MG tablet    sacubitril-valsartan (ENTRESTO) 49-51 MG    Pro b natriuretic peptide (BNP)    Basic metabolic panel    Magnesium    2. Coronary  atherosclerosis due to calcified coronary lesion of native artery  I25.10 EKG 12-Lead   I25.84     3. Hx of stroke involving the right caudate nucleus and right anterolateral thalamus (Jan 2022), Hx of  right parietooccipital stroke (May 2022)  Z86.73     4. Hypertriglyceridemia  E78.1     5. Benign hypertension with CKD (chronic kidney disease) stage III (HCC)  I12.9 sacubitril-valsartan (ENTRESTO) 49-51 MG   N18.30 Pro b natriuretic peptide (BNP)    Basic metabolic panel    Magnesium    6. Type 2 diabetes mellitus with stage 3b chronic kidney disease, with long-term current use of insulin (HCC)  E11.22 sacubitril-valsartan (ENTRESTO) 49-51 MG   N18.32 Pro b natriuretic peptide (BNP)   Q91.6 Basic metabolic panel    Magnesium    7. Long-term insulin use (HCC)  Z79.4     8. Mixed hyperlipidemia  E78.2 rosuvastatin (CRESTOR) 20 MG tablet    9. Status post placement of implantable loop recorder  Z95.818        RECOMMENDATIONS: Erica [name] is a 67 y.o. female whose past medical history and cardiovascular risk factors include: Recent stroke involving the right caudate nucleus and right anterolateral thalamus (March 2022), rectal CA in 2014 that is in remission, T2DM, former tobacco use, emphysema, interstitial lung disease, idiopathic pulmonary fibrosis, and coronary artery calcification on CT scan.    Shortness of breath Improving. Multifactorial : Hypertension, diastolic dysfunction, pulmonary fibrosis, etc. Repeat  echocardiogram notes preserved LVEF, grade 1 diastolic impairment, and aortic regurgitation. Patient has multiple bottles of losartan at various doses and also losartan/hydrochlorothiazide.  She is requesting assistance with medication management to minimize the number of pills if possible. Repeat baseline BMP, NT proBNP. Discontinue losartan formulations and will start Entresto 49/51 mg p.o. twice daily given her shortness of breath, diastolic dysfunction, LVH noted on  echocardiogram. She will need repeat labs in 1 week after initiating Entresto to evaluate kidney function and electrolytes. We will enroll her into principal care management for blood pressure and help with medication approval/assistance. Loop recorder transmissions reviewed remains in normal sinus with occasional PACs.  Coronary atherosclerosis due to calcified coronary lesion of native artery Continue antiplatelet therapy. Restart Crestor.  Hx of stroke involving the right caudate nucleus and right anterolateral thalamus (Jan 2022), Hx of  right parietooccipital stroke (May 2022) Educated the importance of blood pressure management, glycemic control, lipid management. Monitor for now.  Hypertriglyceridemia Currently on fenofibrate. Most recent lipid profile notes stable triglyceride levels. Monitor for now.  Benign hypertension with CKD (chronic kidney disease) stage III ([place]) Office blood pressures are not well controlled. Given her history of strokes recommend better blood pressure management with a goal of 130 mmHg. Medication changes as discussed above. We will enroll her into principal care management for blood pressure monitoring and to consolidate her pharmacological therapy to help improve compliance.  Type 2 diabetes mellitus with stage 3b chronic kidney disease, with long-term current use of insulin ([place]) Educated importance of glycemic control. Continue current medical therapy as prescribed by her other providers. Consider Wilder Glade or Jardiance as adjunctive therapy given her history and comorbid conditions.  Mixed hyperlipidemia We will restart rosuvastatin.  Status post placement of implantable loop recorder Loop recorder transmissions reviewed as part of today's office visit and noted above for further reference.   Remains in normal sinus rhythm, no evidence of atrial fibrillation.  FINAL MEDICATION LIST END OF ENCOUNTER:  Meds ordered this encounter  Medications    rosuvastatin (CRESTOR) 20 MG tablet    Sig: Take 1 tablet (20 mg total) by mouth daily.    Dispense:  30 tablet    Refill:  0   sacubitril-valsartan (ENTRESTO) 49-51 MG    Sig: Take 1 tablet by mouth 2 (two) times daily.    Dispense:  60 tablet    Refill:  0      Current Outpatient Medications:    acetaminophen (TYLENOL) 325 MG tablet, Take 2 tablets (650 mg total) by mouth every 4 (four) hours as needed for mild pain (or temp > 37.5 C (99.5 F))., Disp: , Rfl:    albuterol (VENTOLIN HFA) 108 (90 Base) MCG/ACT inhaler, Inhale 2 puffs into the lungs every 4 (four) hours as needed for wheezing., Disp: 1 g, Rfl: 5   amLODipine (NORVASC) 10 MG tablet, Take 1 tablet (10 mg total) by mouth daily., Disp: 30 tablet, Rfl: 2   aspirin EC 81 MG EC tablet, Take 1 tablet (81 mg total) by mouth daily. Swallow whole., Disp: 30 tablet, Rfl: 11   diclofenac Sodium (VOLTAREN) 1 % GEL, Apply 2 g topically daily as needed (Knee pain)., Disp: 2 g, Rfl: 0   famotidine (PEPCID) 20 MG tablet, Take 1 tablet (20 mg total) by mouth daily., Disp: 30 tablet, Rfl: 0   fenofibrate (TRICOR) 145 MG tablet, TAKE 1 TABLET(145 MG) BY MOUTH DAILY, Disp: 90 tablet, Rfl: 3   gabapentin (NEURONTIN) 100 MG capsule, Take 2  capsules (200 mg total) by mouth 2 (two) times daily as needed (pain)., Disp: 60 capsule, Rfl: 0   insulin glargine (LANTUS SOLOSTAR) 100 UNIT/ML Solostar Pen, Please provide 24 units every a.m. and 20 units every p.m., Disp: 15 mL, Rfl: 11   Insulin Pen Needle (PEN NEEDLES) 32G X 6 MM MISC, 1 application by Does not apply route 2 (two) times daily., Disp: 100 each, Rfl: 1   Multiple Vitamin (MULTIVITAMIN ADULT PO), Take 1 tablet by mouth daily., Disp: , Rfl:    pantoprazole (PROTONIX) 40 MG tablet, Take 40 mg by mouth daily., Disp: , Rfl:    Pirfenidone (ESBRIET) 801 MG TABS, Take 801 mg by mouth with breakfast, with lunch, and with evening meal., Disp: 270 tablet, Rfl: 1   sacubitril-valsartan (ENTRESTO) 49-51  MG, Take 1 tablet by mouth 2 (two) times daily., Disp: 60 tablet, Rfl: 0   SYMBICORT 160-4.5 MCG/ACT inhaler, Inhale 2 puffs into the lungs 2 (two) times daily., Disp: 1 each, Rfl: 5   Tiotropium Bromide Monohydrate (SPIRIVA RESPIMAT) 1.25 MCG/ACT AERS, Inhale 2 puffs into the lungs daily., Disp: 4 g, Rfl: 5   traMADol (ULTRAM) 50 MG tablet, Take 1 tablet (50 mg total) by mouth every 8 (eight) hours as needed for severe pain., Disp: 30 tablet, Rfl: 0   venlafaxine XR (EFFEXOR-XR) 75 MG 24 hr capsule, Take 1 capsule (75 mg total) by mouth daily with breakfast., Disp: 30 capsule, Rfl: 0   clopidogrel (PLAVIX) 75 MG tablet, Take 75 mg by mouth daily., Disp: , Rfl:    hydrALAZINE (APRESOLINE) 25 MG tablet, Take by mouth., Disp: , Rfl:    nortriptyline (PAMELOR) 25 MG capsule, Take 25 mg by mouth at bedtime., Disp: , Rfl:    rosuvastatin (CRESTOR) 20 MG tablet, Take 1 tablet (20 mg total) by mouth daily., Disp: 30 tablet, Rfl: 0   sertraline (ZOLOFT) 50 MG tablet, Take 50 mg by mouth daily., Disp: , Rfl:   Orders Placed This Encounter  Procedures   Pro b natriuretic peptide (BNP)   Basic metabolic panel   Magnesium   EKG 12-Lead    --Continue cardiac medications as reconciled in final medication list. --Return in about 6 months (around 03/26/2022) for Follow up, Dyspnea. Or sooner if needed. --Continue follow-up with your primary care physician regarding the management of your other chronic comorbid conditions.  Patient's questions and concerns were addressed to her satisfaction. She voices understanding of the instructions provided during this encounter.   This note was created using a voice recognition software as a result there may be grammatical errors inadvertently enclosed that do not reflect the nature of this encounter. Every attempt is made to correct such errors.  Rex Kras, [place], [facility]  Pager: [phone] Office: [phone]

## 2021-09-25 LAB — BASIC METABOLIC PANEL
BUN/Creatinine Ratio: 13 (ref 12–28)
BUN: 24 mg/dL (ref 8–27)
CO2: 22 mmol/L (ref 20–29)
Calcium: 9.6 mg/dL (ref 8.7–10.3)
Chloride: 110 mmol/L — ABNORMAL HIGH (ref 96–106)
Creatinine, Ser: 1.79 mg/dL — ABNORMAL HIGH (ref 0.57–1.00)
Glucose: 158 mg/dL — ABNORMAL HIGH (ref 70–99)
Potassium: 4.6 mmol/L (ref 3.5–5.2)
Sodium: 143 mmol/L (ref 134–144)
eGFR: 31 mL/min/{1.73_m2} — ABNORMAL LOW (ref >59)

## 2021-09-25 LAB — MAGNESIUM: Magnesium: 1.7 mg/dL (ref 1.6–2.3)

## 2021-09-25 LAB — PRO B NATRIURETIC PEPTIDE: NT-Pro BNP: 263 pg/mL (ref 0–301)

## 2021-09-27 ENCOUNTER — Telehealth: Payer: Self-pay | Admitting: Pharmacist

## 2021-09-27 DIAGNOSIS — R0602 Shortness of breath: Secondary | ICD-10-CM

## 2021-09-27 NOTE — Progress Notes (Signed)
Called and discussed lab results with pt. Labs show mild increase in renal function, but unsure of pt's previous ARB dose/formulation. Pt reports that she started Entresto 49/51 mg BID last Saturday. Tolerating it well so far with significant side effects. Pt agreeable to get repeat labs in 1 week. Will continue monitoring and follow up closely following repeat labs.

## 2021-09-27 NOTE — Telephone Encounter (Signed)
Called and discussed lab results with pt. Labs show mild increase in renal function, but unsure of pt's previous ARB dose/formulation. Pt reports that she started Entresto 49/51 mg BID last Saturday. Tolerating it well so far with significant side effects. Pt agreeable to get repeat labs in 1 week. Will continue monitoring and follow up closely following repeat labs.

## 2021-09-28 ENCOUNTER — Other Ambulatory Visit: Payer: Self-pay

## 2021-09-28 DIAGNOSIS — R0602 Shortness of breath: Secondary | ICD-10-CM

## 2021-09-29 ENCOUNTER — Ambulatory Visit (HOSPITAL_COMMUNITY): Payer: Medicare HMO

## 2021-09-29 ENCOUNTER — Ambulatory Visit: Payer: Medicare HMO | Attending: Physical Medicine & Rehabilitation

## 2021-09-29 ENCOUNTER — Ambulatory Visit (HOSPITAL_COMMUNITY): Admission: RE | Admit: 2021-09-29 | Payer: Medicare HMO | Source: Ambulatory Visit

## 2021-09-29 VITALS — BP 158/88

## 2021-09-29 DIAGNOSIS — M6281 Muscle weakness (generalized): Secondary | ICD-10-CM | POA: Insufficient documentation

## 2021-09-29 DIAGNOSIS — R2681 Unsteadiness on feet: Secondary | ICD-10-CM | POA: Insufficient documentation

## 2021-09-29 DIAGNOSIS — R2689 Other abnormalities of gait and mobility: Secondary | ICD-10-CM

## 2021-09-29 NOTE — Therapy (Signed)
OUTPATIENT PHYSICAL THERAPY TREATMENT NOTE   Patient Name: Erica [name] MRN: [id number] DOB:June 20, 1955, 66 y.o., female Today's Date: 09/29/2021  PCP: Nolene Ebbs, MD REFERRING PROVIDER: [name]   PT End of Session - 09/29/21 1139     Visit Number 29    Number of Visits 29    Date for PT Re-Evaluation 10/08/21    Authorization Type [employer]    Authorization Time Period 19 visists from 11/8-12/30/22; new approval from 12/29-2/17/23; 08/17/21- 10/08/21 8 visits    Authorization - Visit Number 3    Authorization - Number of Visits 8    Progress Note Due on Visit 48    PT Start Time 1137    PT Stop Time 1221    PT Time Calculation (min) 44 min    Equipment Utilized During Treatment Gait belt    Activity Tolerance Patient tolerated treatment well    Behavior During Therapy WFL for tasks assessed/performed             Past Medical History:  Diagnosis Date   Arthritis    especially in shoulders   Asthma    Depression    Diabetes mellitus    Embolic stroke ([place]) 4/48/1856   GERD (gastroesophageal reflux disease)    Headache(784.0)    "mild"   Hypertension    Loop Biotronik loop implant 12/16/2020 12/16/2020   Mental disorder    depression   Neuropathy    feet    Pain    arthritis pain - takes tramadol as needed   Rectal polyp    very little bleeding with bowel movements- no pain   Stroke ([place]) 11/03/2020   Suicide attempt [facility])    Past Surgical History:  Procedure Laterality Date   ANAL RECTAL MANOMETRY N/A 07/13/2016   Procedure: ANO RECTAL MANOMETRY;  Surgeon: Leighton Ruff, MD;  Location: [facility];  Service: Endoscopy;  Laterality: N/A;   CESAREAN SECTION     EUS N/A 11/21/2012   Procedure: LOWER ENDOSCOPIC ULTRASOUND (EUS);  Surgeon: Arta Silence, MD;  Location: [facility];  Service: Endoscopy;  Laterality: N/A;   FLEXIBLE SIGMOIDOSCOPY N/A 11/21/2012   Procedure: FLEXIBLE SIGMOIDOSCOPY;  Surgeon: Arta Silence, MD;  Location: [facility];  Service: Endoscopy;  Laterality: N/A;   FLEXIBLE SIGMOIDOSCOPY N/A 01/28/2013   Procedure: FLEXIBLE SIGMOIDOSCOPY;  Surgeon: Leighton Ruff, MD;  Location: [facility];  Service: Endoscopy;  Laterality: N/A;   LAPAROSCOPIC LOW ANTERIOR RESECTION N/A 01/29/2013   Procedure: LAPAROSCOPIC LOW ANTERIOR RESECTION, Rigid Proctoscopy;  Surgeon: Leighton Ruff, MD;  Location: [facility];  Service: General;  Laterality: N/A;   LAPAROSCOPIC SIGMOID COLECTOMY N/A 11/14/2012   Procedure: DIAGNOSTIC LAPAROSCOPY AND SIGMOIDMOIDOSCOPY ;  Surgeon: Rolm Bookbinder, MD;  Location: [facility];  Service: General;  Laterality: N/A;   RECTAL ULTRASOUND N/A 07/13/2016   Procedure: RECTAL ULTRASOUND;  Surgeon: Leighton Ruff, MD;  Location: [facility];  Service: Endoscopy;  Laterality: N/A;   TONSILLECTOMY     TONSILLECTOMY AND ADENOIDECTOMY     Patient Active Problem List   Diagnosis Date Noted   Encounter for loop recorder check 01/02/2021   Loop Biotronik loop implant 12/16/2020 12/16/2020   Leukopenia    Controlled type 2 diabetes mellitus with hyperglycemia, with long-term current use of insulin (HCC)    Vascular headache    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[place]) 31/49/7026   Embolic stroke ([place]) 37/85/8850   Sinus bradycardia on ECG 11/03/2020   Benign essential HTN    Pancytopenia ([place])    Stage  3b chronic kidney disease (HCC)    Anemia    Labile blood glucose    Labile blood pressure    Diabetic peripheral neuropathy (HCC)    Essential hypertension    AKI (acute kidney injury) ([place])    Ataxia, late effect of cerebrovascular disease    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[place]) 06/30/2020   Acute CVA (cerebrovascular accident) ([place]) 06/26/2020   CVA (cerebral vascular accident) ([place]) 06/25/2020   BMI 32.0-32.9,adult 06/03/2020   Left upper lobe pneumonia 03/30/2020   Chest pain 10/01/2019   MDD (major depressive disorder), recurrent severe, without psychosis ([place]) 05/09/2019   Suicide attempt ([place]) 05/09/2019    Cocaine abuse ([place]) 05/09/2019   Interstitial lung disease ([place]) 06/21/2016   COPD (chronic obstructive pulmonary disease) ([place]) 06/21/2016   Cough 06/21/2016   GERD (gastroesophageal reflux disease) 06/21/2016   Hypersomnia 06/21/2016   Tobacco user 06/21/2016   Rectal cancer ([place]) 01/30/2013    REFERRING DIAG: gait disturbance, post-stroke  THERAPY DIAG:  Other abnormalities of gait and mobility  Muscle weakness (generalized)  Unsteadiness on feet  PERTINENT HISTORY: arthritis, asthma, depression, HTN, neuropathy, suicide attempt, DM   PRECAUTIONS: fall  SUBJECTIVE: Pt reports that she saw cardiologist last week. They had her restart her rosuvastatin and added entresto. Pt reports that she is feeling pretty good today.  PAIN:  Are you having pain? No     Vitals:   09/29/21 1146  BP: (!) 158/88        TODAY'S TREATMENT:   09/29/21:  RAMP:  Level of Assistance: SBA Assistive device utilized: Single point cane Ramp Comments: with quad tip cane. Performed x 2  CURB:  Level of Assistance: CGA Assistive device utilized: Single point cane Curb Comments: performed x 2 with verbal cues for cuing for sequence.  STAIRS:  Level of Assistance: SBA  Stair Negotiation Technique: Step to Pattern Alternating Pattern  with Bilateral Rails  Number of Stairs: 4   Height of Stairs: 6  Comments: performed reciprocal up and step-to/reciprocal  down  GAIT: Gait pattern: step through pattern, decreased step length- Left, and decreased ankle dorsiflexion- Left Distance walked: 460' Assistive device utilized: Single point cane and with quad tip and left foot-up brace Level of assistance: SBA and CGA Comments: Pt had a couple episodes of left foot catching. Verbal cues to pick up more from hip.  10 m gait speed=20.10 sec=0.5 m/s with cane      [facility] Adult PT Treatment/Exercise - 09/29/21 1154       Standardized Balance Assessment   Standardized Balance Assessment Berg  Balance Test      Berg Balance Test   Sit to Stand Able to stand without using hands and stabilize independently    Standing Unsupported Able to stand safely 2 minutes    Sitting with Back Unsupported but Feet Supported on Floor or Stool Able to sit safely and securely 2 minutes    Stand to Sit Sits safely with minimal use of hands    Transfers Able to transfer safely, minor use of hands    Standing Unsupported with Eyes Closed Able to stand 10 seconds safely    Standing Ubsupported with Feet Together Able to place feet together independently and stand 1 minute safely    From Standing, Reach Forward with Outstretched Arm Can reach forward >12 cm safely (5")    From Standing Position, Pick up Object from Floor Able to pick up shoe safely and easily    From Standing Position, Turn to  Look Behind Over each Shoulder Turn sideways only but maintains balance    Turn 360 Degrees Able to turn 360 degrees safely one side only in 4 seconds or less    Standing Unsupported, Alternately Place Feet on Step/Stool Able to stand independently and safely and complete 8 steps in 20 seconds    Standing Unsupported, One Foot in Front Able to plae foot ahead of the other independently and hold 30 seconds    Standing on One Leg Tries to lift leg/unable to hold 3 seconds but remains standing independently    Total Score 48              PATIENT EDUCATION: Education details: Results of testing and plan for discharge next week. Person educated: Patient Education method: Explanation Education comprehension: verbalized understanding   HOME EXERCISE PROGRAM: [id number]   PT Short Term Goals - 08/17/21 0916       PT SHORT TERM GOAL #1   Title Pt will increase Berg from 49/56 to >53/56 for improved balance and decreased fall risk.    Baseline 04/20/21 49/56 > decreased to 46/56. 08/17/21 44/56. 09/29/21 48/56   Time 4    Period Weeks    Status Not met but improved from last assessment   Target Date 09/14/21       PT SHORT TERM GOAL #2   Title Pt will ambulate up/down curb and ramp with cane mod I for improved community access.    Baseline 05/27/21 supervision on ramp and curb. 09/29/21 supervision with cane   Time 4    Period Weeks    Status Partially met   Target Date 09/14/21      PT SHORT TERM GOAL #3   Title Pt will increase gait speed to >0.23ms with cane for improved community mobility.    Baseline 0.64m > declined slightly to .625 m/sec. 05/27/21 0.7224mwith cane. 08/17/21 0.342m57mith cane. 09/29/21 0.42m/s3mth cane   Time 4    Period Weeks    Status Not met   Target Date 09/14/21              PT Long Term Goals - 08/17/21 0917       PT LONG TERM GOAL #1   Title Pt will ambulate >500' on varied level surfaces mod I with cane with quad tip for improved short community distances.(LTGs due 10/08/21)    Baseline 08/17/21 Pt ambulated 230' on level surfaces with cane with quad tip supervision/CGA as fatigued. 09/29/21 460' with cane SBA/CGA.   Time 8    Period Weeks    Status Partially met   Target Date 10/08/21      PT LONG TERM GOAL #2   Title Pt will increase DGI from 14/24 to >19/24 for improved balance and gait safety.    Baseline 04/20/21 14/24. 06/03/21 17/24. 08/17/21 13/24    Time 8    Period Weeks    Status On-going    Target Date 10/08/21      PT LONG TERM GOAL #3   Title Pt will ambulate up/down 4 steps with cane in reciprocal pattern for improved community access and functional strength.    Baseline 06/03/21 able to perform with rail in reciprocal pattern. 08/17/21 reciprocal pattern with bilateral rails. 09/29/21 Mod I with rails with reciprocal pattern up and step-to down.   Time 8    Period Weeks    Status Not met- needs rails   Target Date 10/08/21      PT  LONG TERM GOAL #4   Title Pt will be able to complete 6 min walk without resting for improved activity tolerance.    Baseline had to rest after 2 min 45 sec. 08/17/21 Had to rest at 2 min 37 sec. Performed  with cane    Time 8    Period Weeks    Status On-going    Target Date 10/08/21      PT LONG TERM GOAL #5   Title FOTO will be completed at d/c    Baseline 06/03/21 62    Time 8    Period Weeks    Status On-going    Target Date 10/08/21              Plan - 08/17/21 0933     Clinical Impression Statement PT assessed STGs and started checking LTGs. She increased Berg Balance to 48/56 from last assessment which is close to her score prior to her set back. She was not able to increase gait speed. She still requires SBA/CGA for gait with cane longer distances. Advised to use walker for longer distances still. Pt needs rails for stair negotation. PT will check remaining goals next visit with plan to discharge.   Personal Factors and Comorbidities Comorbidity 3+    Comorbidities arthritis, asthma, depression, HTN, neuropathy, suicide attempt, DM    Examination-Activity Limitations Locomotion Level;Transfers;Stand;Stairs;Squat    Examination-Participation Restrictions Community Activity;Driving;Yard Work;Cleaning    Stability/Clinical Decision Making Evolving/Moderate complexity    Rehab Potential Good    PT Frequency 1x / week    PT Duration 8 weeks    PT Treatment/Interventions ADLs/Self Care Home Management;DME Instruction;Neuromuscular re-education;Manual techniques;Balance training;Therapeutic exercise;Therapeutic activities;Functional mobility training;Stair training;Gait training;Vestibular;Passive range of motion;Orthotic Fit/Training;Patient/family education    PT Next Visit Plan Check remaining LTGs for planned discharge next visit.   Consulted and Agree with Plan of Care Patient              Cherly Anderson, PT, DPT, NCS  [facility] [address] [place], [place] [address] [phone] 09/29/21, 7:18 PM

## 2021-10-03 ENCOUNTER — Ambulatory Visit (HOSPITAL_BASED_OUTPATIENT_CLINIC_OR_DEPARTMENT_OTHER): Admission: RE | Admit: 2021-10-03 | Payer: Medicare HMO | Source: Ambulatory Visit

## 2021-10-03 ENCOUNTER — Other Ambulatory Visit: Payer: Self-pay | Admitting: Cardiology

## 2021-10-03 DIAGNOSIS — R0602 Shortness of breath: Secondary | ICD-10-CM

## 2021-10-03 DIAGNOSIS — E782 Mixed hyperlipidemia: Secondary | ICD-10-CM

## 2021-10-05 ENCOUNTER — Ambulatory Visit: Payer: Medicare HMO

## 2021-10-05 ENCOUNTER — Ambulatory Visit (HOSPITAL_BASED_OUTPATIENT_CLINIC_OR_DEPARTMENT_OTHER)
Admission: RE | Admit: 2021-10-05 | Discharge: 2021-10-05 | Disposition: A | Discharge status: Home / Self Care | Payer: Medicare HMO | Source: Ambulatory Visit | Attending: Internal Medicine | Admitting: Internal Medicine

## 2021-10-05 DIAGNOSIS — M6281 Muscle weakness (generalized): Secondary | ICD-10-CM

## 2021-10-05 DIAGNOSIS — R2689 Other abnormalities of gait and mobility: Secondary | ICD-10-CM

## 2021-10-05 DIAGNOSIS — J849 Interstitial pulmonary disease, unspecified: Secondary | ICD-10-CM | POA: Diagnosis present

## 2021-10-05 DIAGNOSIS — R2681 Unsteadiness on feet: Secondary | ICD-10-CM

## 2021-10-05 IMAGING — CT CT CHEST HIGH RESOLUTION
2 of 7 series · 14 of 36 positions shown, 17 images · non-contrast
Comparison: Chest CT [DATE].

CLINICAL DATA: 66-year-old female with history of increasing
shortness of breath with exertion. Evaluate for interstitial lung
disease.



[Series 7: high res thins · axial · 0.67mm/px · z∈[+970,+1198]mm · 11 of 274 slices shown, 14 images]
[im 23/274  mediastinal]
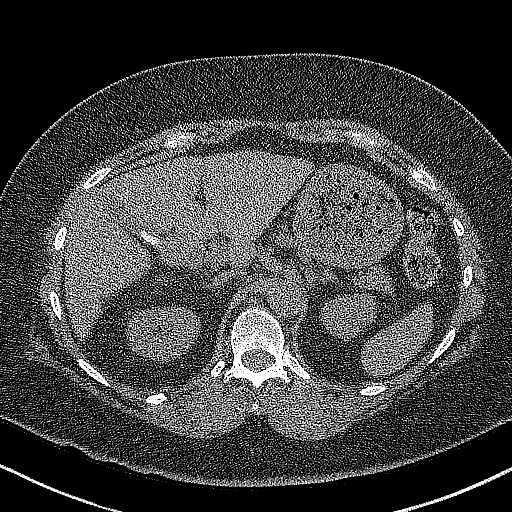
[im 23/274  lung]
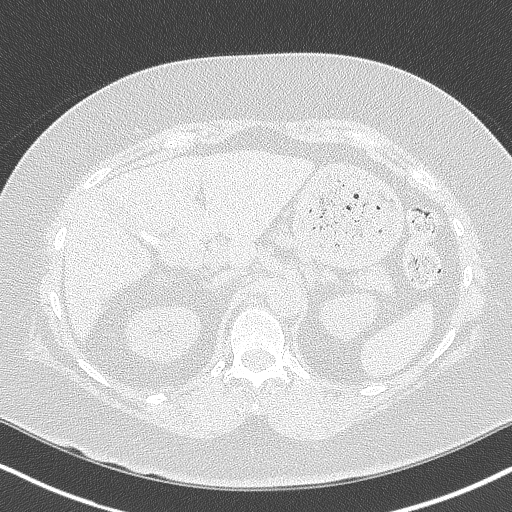
[im 46/274  lung]
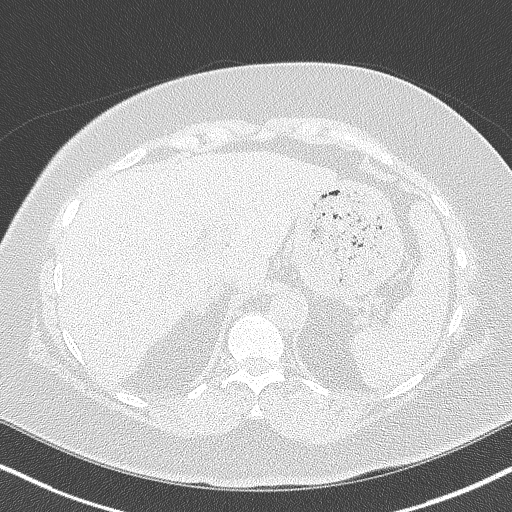
[im 69/274  lung]
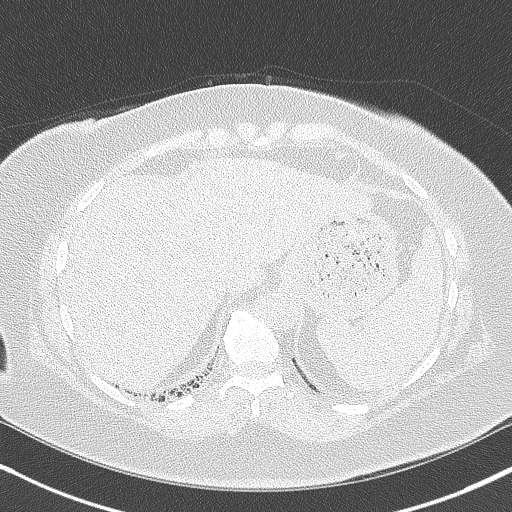
[im 92/274  lung]
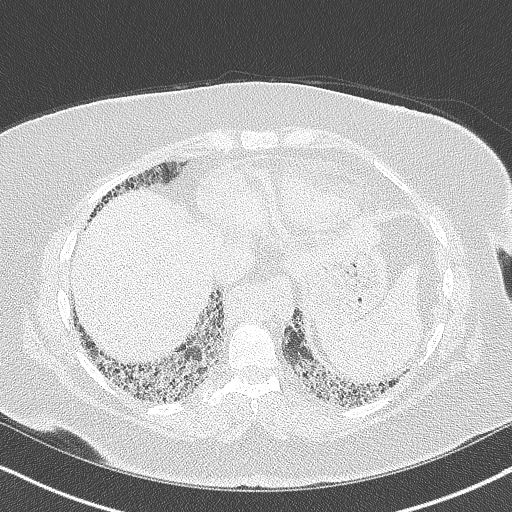
[im 114/274  mediastinal]
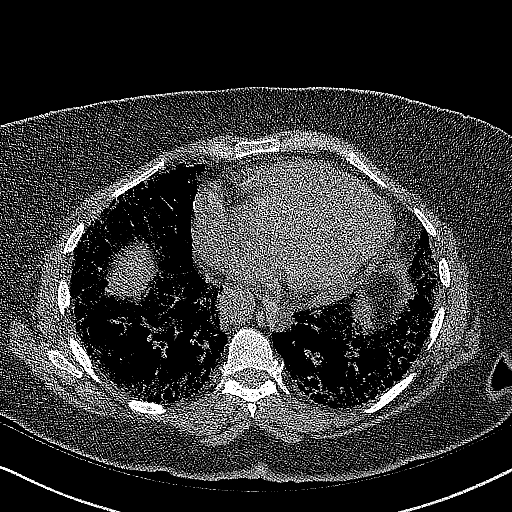
[im 114/274  lung]
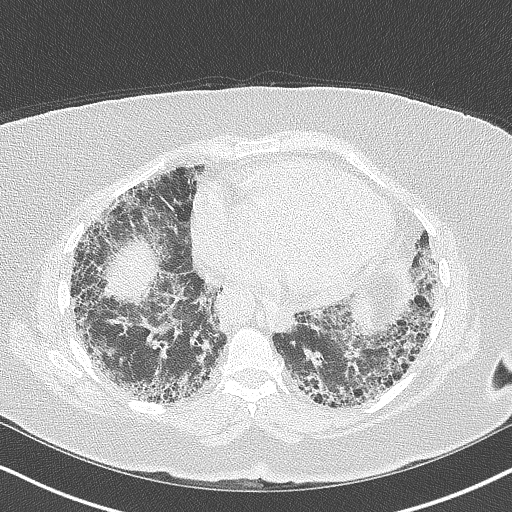
[im 137/274  lung]
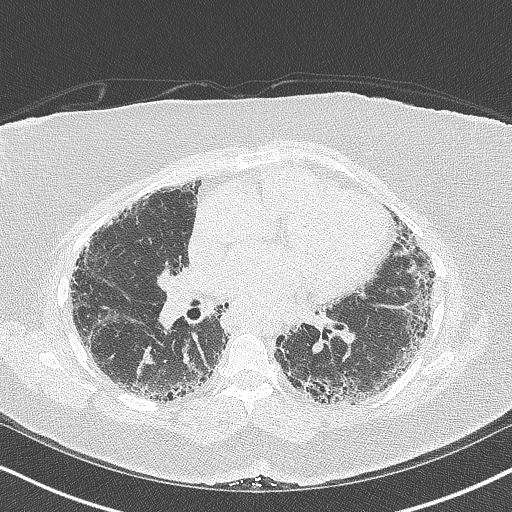
[im 160/274  lung]
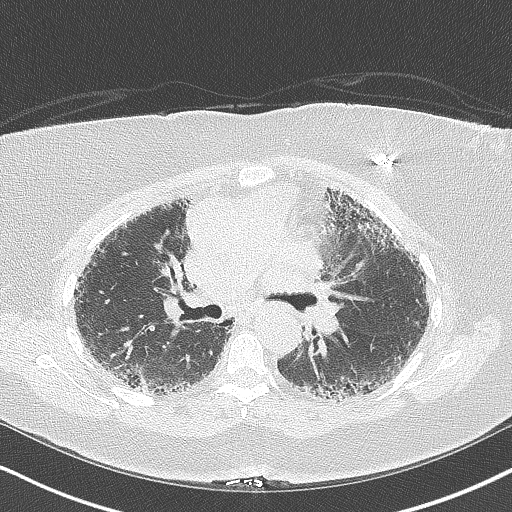
[im 183/274  lung]
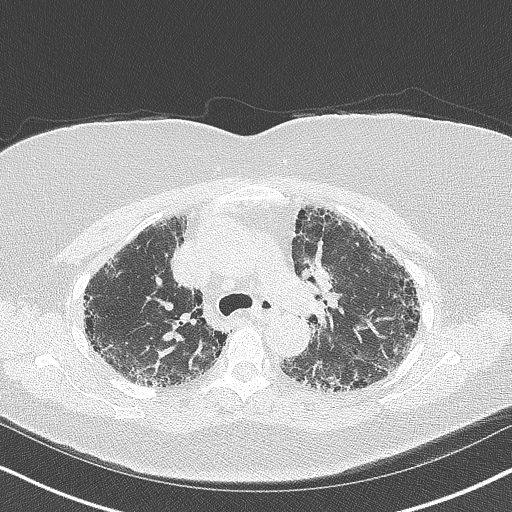
[im 205/274  mediastinal]
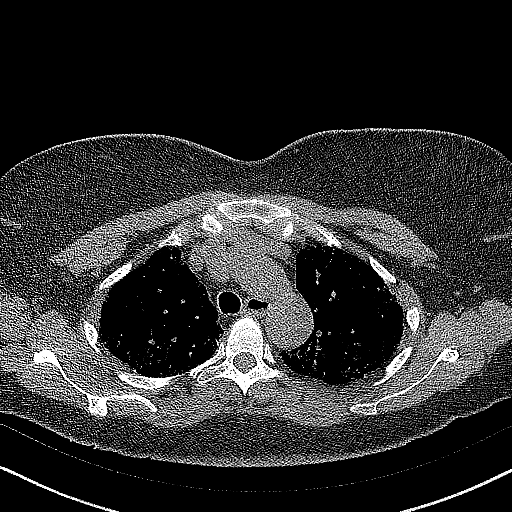
[im 205/274  lung]
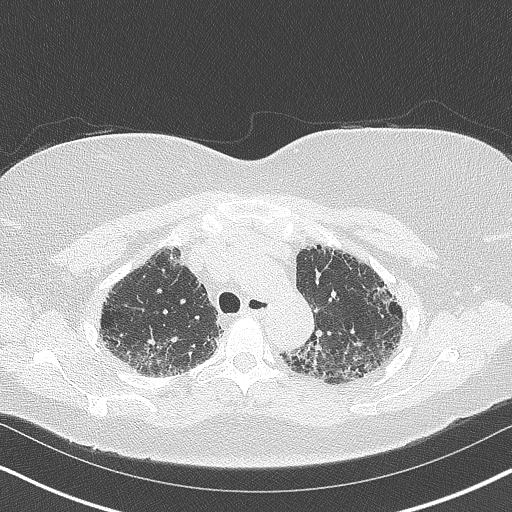
[im 228/274  lung]
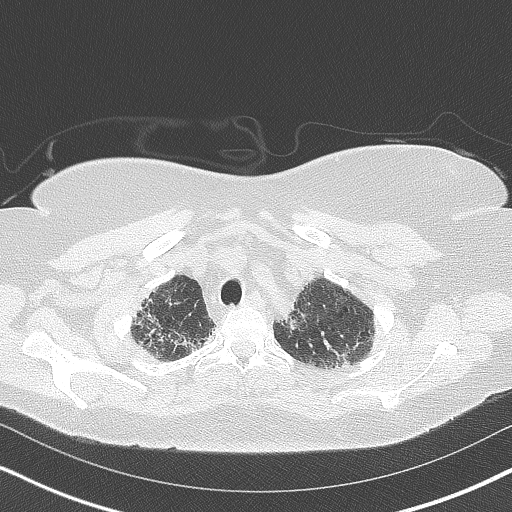
[im 251/274  lung]
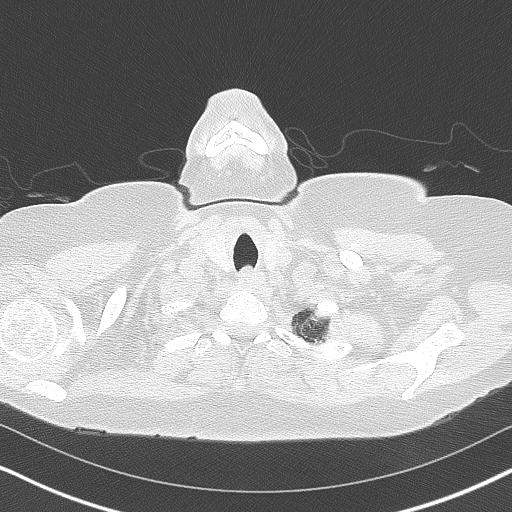

[Series 8: cor soft · coronal · 0.57mm/px · 3 of 145 slices shown]
[im 29/145  lung]
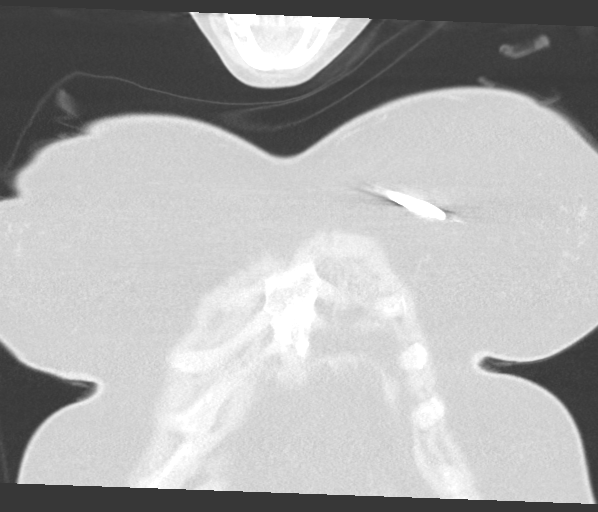
[im 58/145  lung]
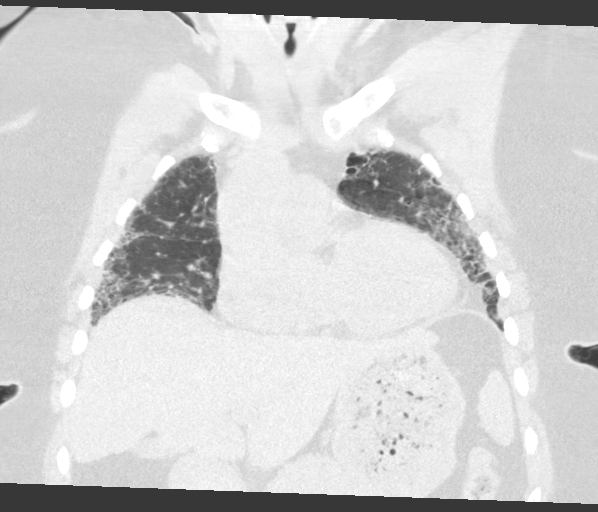
[im 87/145  lung]
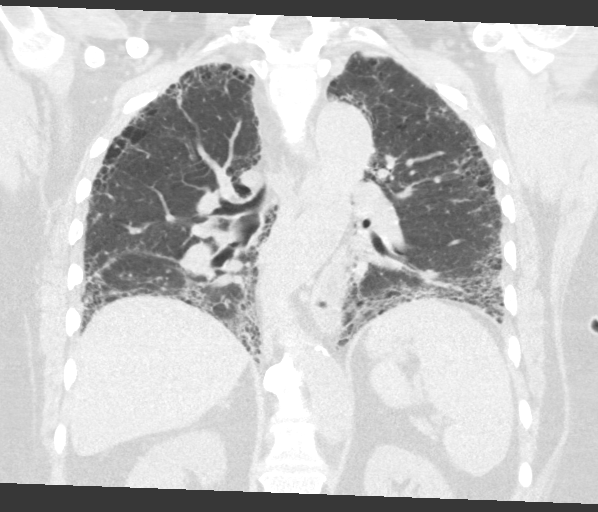

[14 of 36 positions shown; findings below may reference images not displayed]

FINDINGS: Cardiovascular: Heart size is normal. There is no significant
pericardial fluid, thickening or pericardial calcification. There is
aortic atherosclerosis, as well as atherosclerosis of the great
vessels of the mediastinum and the coronary arteries, including
calcified atherosclerotic plaque in the left anterior descending and
right coronary arteries.

Mediastinum/Nodes: No pathologically enlarged mediastinal or hilar
lymph nodes. Please note that accurate exclusion of hilar adenopathy
is limited on noncontrast CT scans. Esophagus is unremarkable in
appearance. No axillary lymphadenopathy.

Lungs/Pleura: High-resolution images demonstrate widespread areas of
septal thickening, subpleural reticulation, parenchymal banding,
traction bronchiectasis and honeycombing. These findings have a
definitive craniocaudal gradient and are essentially stable compared
to the prior examination. Inspiratory and expiratory imaging is
unremarkable. No acute consolidative airspace disease. No pleural
effusions. No suspicious appearing pulmonary nodules or masses are
noted.

Upper Abdomen: Tiny calcified gallstones in the gallbladder,
incompletely imaged.

Musculoskeletal: There are no aggressive appearing lytic or blastic
lesions noted in the visualized portions of the skeleton.
IMPRESSION: 1. The appearance of the lungs is compatible with interstitial lung
disease, with a spectrum of findings considered diagnostic of usual
interstitial pneumonia (UIP) per current ATS guidelines. Findings
appear stable compared to the prior examination, with no definite
progression of disease.
2. Aortic atherosclerosis, in addition to 2 vessel coronary artery
disease. Please note that although the presence of coronary artery
calcium documents the presence of coronary artery disease, the
severity of this disease and any potential stenosis cannot be
assessed on this non-gated CT examination. Assessment for potential
risk factor modification, dietary therapy or pharmacologic therapy
may be warranted, if clinically indicated.
3. Cholelithiasis.

Aortic Atherosclerosis ([JT]-[JT]).

## 2021-10-05 NOTE — Therapy (Signed)
OUTPATIENT PHYSICAL THERAPY TREATMENT NOTE/PROGRESS [place] SUMMARY   Patient Name: Erica [name] MRN: [id number] DOB:Jan 30, 1956, 66 y.o., female Today's Date: 10/05/2021  PCP: Nolene Ebbs, MD REFERRING PROVIDER: [name]  Physical Therapy Progress Note   Dates of Reporting Period: 06/03/2021 - 10/05/2021  See Note below for Objective Data and Assessment of Progress/Goals.  Thank you for the referral of this patient. Guillermina [place], PT, DPT  PHYSICAL THERAPY DISCHARGE SUMMARY  Visits from Start of Care: 30  Current functional level related to goals / functional outcomes: See Clinical Impression   Remaining deficits: Decreased Strength, Abnormal Gait, Decreased Balance   Education / Equipment: HEP provided; Safety with Ambulation   Patient agrees to discharge. Patient goals were not met. Patient is being discharged due to lack of progress.    PT End of Session - 10/05/21 1307     Visit Number 30    Number of Visits 33    Date for PT Re-Evaluation 10/08/21    Authorization Type [employer]    Authorization Time Period 16 visists from 11/8-12/30/22; new approval from 12/29-2/17/23; 08/17/21- 10/08/21 8 visits    Authorization - Visit Number 4    Authorization - Number of Visits 8    Progress Note Due on Visit 20    PT Start Time [place] During Treatment Gait belt    Activity Tolerance Patient tolerated treatment well    Behavior During Therapy [facility] for tasks assessed/performed             Past Medical History:  Diagnosis Date   Arthritis    especially in shoulders   Asthma    Depression    Diabetes mellitus    Embolic stroke ([place]) 2/70/3500   GERD (gastroesophageal reflux disease)    Headache(784.0)    "mild"   Hypertension    Loop Biotronik loop implant 12/16/2020 12/16/2020   Mental disorder    depression   Neuropathy    feet    Pain    arthritis pain - takes tramadol as needed   Rectal polyp    very little  bleeding with bowel movements- no pain   Stroke ([place]) 11/03/2020   Suicide attempt [facility])    Past Surgical History:  Procedure Laterality Date   ANAL RECTAL MANOMETRY N/A 07/13/2016   Procedure: ANO RECTAL MANOMETRY;  Surgeon: Leighton Ruff, MD;  Location: [facility] ENDOSCOPY;  Service: Endoscopy;  Laterality: N/A;   CESAREAN SECTION     EUS N/A 11/21/2012   Procedure: LOWER ENDOSCOPIC ULTRASOUND (EUS);  Surgeon: Arta Silence, MD;  Location: [facility];  Service: Endoscopy;  Laterality: N/A;   FLEXIBLE SIGMOIDOSCOPY N/A 11/21/2012   Procedure: FLEXIBLE SIGMOIDOSCOPY;  Surgeon: Arta Silence, MD;  Location: [facility];  Service: Endoscopy;  Laterality: N/A;   FLEXIBLE SIGMOIDOSCOPY N/A 01/28/2013   Procedure: FLEXIBLE SIGMOIDOSCOPY;  Surgeon: Leighton Ruff, MD;  Location: [facility];  Service: Endoscopy;  Laterality: N/A;   LAPAROSCOPIC LOW ANTERIOR RESECTION N/A 01/29/2013   Procedure: LAPAROSCOPIC LOW ANTERIOR RESECTION, Rigid Proctoscopy;  Surgeon: Leighton Ruff, MD;  Location: [facility];  Service: General;  Laterality: N/A;   LAPAROSCOPIC SIGMOID COLECTOMY N/A 11/14/2012   Procedure: DIAGNOSTIC LAPAROSCOPY AND SIGMOIDMOIDOSCOPY ;  Surgeon: Rolm Bookbinder, MD;  Location: [facility];  Service: General;  Laterality: N/A;   RECTAL ULTRASOUND N/A 07/13/2016   Procedure: RECTAL ULTRASOUND;  Surgeon: Leighton Ruff, MD;  Location: [facility];  Service: Endoscopy;  Laterality: N/A;   TONSILLECTOMY     TONSILLECTOMY AND  ADENOIDECTOMY     Patient Active Problem List   Diagnosis Date Noted   Encounter for loop recorder check 01/02/2021   Loop Biotronik loop implant 12/16/2020 12/16/2020   Leukopenia    Controlled type 2 diabetes mellitus with hyperglycemia, with long-term current use of insulin (HCC)    Vascular headache    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[place]) 69/62/9528   Embolic stroke ([place]) 41/32/4401   Sinus bradycardia on ECG 11/03/2020   Benign essential HTN    Pancytopenia (HCC)    Stage 3b chronic  kidney disease ([place])    Anemia    Labile blood glucose    Labile blood pressure    Diabetic peripheral neuropathy (HCC)    Essential hypertension    AKI (acute kidney injury) ([place])    Ataxia, late effect of cerebrovascular disease    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[place]) 06/30/2020   Acute CVA (cerebrovascular accident) ([place]) 06/26/2020   CVA (cerebral vascular accident) ([place]) 06/25/2020   BMI 32.0-32.9,adult 06/03/2020   Left upper lobe pneumonia 03/30/2020   Chest pain 10/01/2019   MDD (major depressive disorder), recurrent severe, without psychosis ([place]) 05/09/2019   Suicide attempt ([place]) 05/09/2019   Cocaine abuse ([place]) 05/09/2019   Interstitial lung disease ([place]) 06/21/2016   COPD (chronic obstructive pulmonary disease) ([place]) 06/21/2016   Cough 06/21/2016   GERD (gastroesophageal reflux disease) 06/21/2016   Hypersomnia 06/21/2016   Tobacco user 06/21/2016   Rectal cancer ([place]) 01/30/2013    REFERRING DIAG: gait disturbance, post-stroke  THERAPY DIAG:  Other abnormalities of gait and mobility  Muscle weakness (generalized)  Unsteadiness on feet  PERTINENT HISTORY: arthritis, asthma, depression, HTN, neuropathy, suicide attempt, DM   PRECAUTIONS: fall  SUBJECTIVE: No new changes/complaints. No falls to report. No pain today noted.   PAIN:  Are you having pain? No   There were no vitals filed for this visit.   TODAY'S TREATMENT:    GAIT: Gait pattern: step through pattern, decreased step length- Left, and decreased ankle dorsiflexion- Left Distance walked: 100' x 2 Assistive device utilized: Single point cane and with quad tip and left foot-up brace Level of assistance: SBA and CGA Comments: Pt had a couple episodes of left foot catching. Verbal cues to pick up more from hip provided. Patient reporting feeling more fatigued today. Require intermittent rest breaks due to this.   6MWT:   BP prior to ambulation: 132/96, HR: 89 Patient only able to ambulate approx  1 minute 15 seconds, then 1 minutes 20 seconds with seated rest break in between. Patient ambulating 105' on 1st trial, and 75' on 2nd trial.   BP after ambulation: 135/99, HR: 92. With seated rest break improved to 127/90, HR: 87  SOB: 0/10, RPE: 5/10.    FOTO: 51%   NMR:    [facility] PT Assessment - 10/05/21 0001       Standardized Balance Assessment   Standardized Balance Assessment Dynamic Gait Index      Dynamic Gait Index   Level Surface Moderate Impairment    Change in Gait Speed Mild Impairment    Gait with Horizontal Head Turns Moderate Impairment    Gait with Vertical Head Turns Moderate Impairment    Gait and Pivot Turn Mild Impairment    DGI comment: unable to finish assesment due to patient request and fatigue noted. Patient reporting she feels as if she is going to be sick, PT then assessed BP with it 128/85, patient denies any additional symptoms.  PATIENT EDUCATION: Education details: Progress toward LTGs; Planned D/C Person educated: Patient Education method: Explanation Education comprehension: verbalized understanding   HOME EXERCISE PROGRAM: [id number]   PT Short Term Goals - 08/17/21 0916       PT SHORT TERM GOAL #1   Title Pt will increase Berg from 49/56 to >53/56 for improved balance and decreased fall risk.    Baseline 04/20/21 49/56 > decreased to 46/56. 08/17/21 44/56. 09/29/21 48/56   Time 4    Period Weeks    Status Not met but improved from last assessment   Target Date 09/14/21      PT SHORT TERM GOAL #2   Title Pt will ambulate up/down curb and ramp with cane mod I for improved community access.    Baseline 05/27/21 supervision on ramp and curb. 09/29/21 supervision with cane   Time 4    Period Weeks    Status Partially met   Target Date 09/14/21      PT SHORT TERM GOAL #3   Title Pt will increase gait speed to >0.81ms with cane for improved community mobility.    Baseline 0.671m > declined slightly to .625 m/sec. 05/27/21  0.7228mwith cane. 08/17/21 0.97m37mith cane. 09/29/21 0.80m/s52mth cane   Time 4    Period Weeks    Status Not met   Target Date 09/14/21              PT Long Term Goals - 08/17/21 0917       PT LONG TERM GOAL #1   Title Pt will ambulate >500' on varied level surfaces mod I with cane with quad tip for improved short community distances.(LTGs due 10/08/21)    Baseline 08/17/21 Pt ambulated 230' on level surfaces with cane with quad tip supervision/CGA as fatigued. 09/29/21 460' with cane SBA/CGA.   Time 8    Period Weeks    Status Partially met   Target Date 10/08/21      PT LONG TERM GOAL #2   Title Pt will increase DGI from 14/24 to >19/24 for improved balance and gait safety.    Baseline 04/20/21 14/24. 06/03/21 17/24. 08/17/21 13/24;unable to finish due to fatigue/sick on stomach   Time 8    Period Weeks    Status On-going; Not Met    Target Date 10/08/21      PT LONG TERM GOAL #3   Title Pt will ambulate up/down 4 steps with cane in reciprocal pattern for improved community access and functional strength.    Baseline 06/03/21 able to perform with rail in reciprocal pattern. 08/17/21 reciprocal pattern with bilateral rails. 09/29/21 Mod I with rails with reciprocal pattern up and step-to down.   Time 8    Period Weeks    Status Not met- needs rails   Target Date 10/08/21      PT LONG TERM GOAL #4   Title Pt will be able to complete 6 min walk without resting for improved activity tolerance.    Baseline had to rest after 2 min 45 sec. 08/17/21 Had to rest at 2 min 37 sec. Performed with cane, requiring rest break after 1 min 15 seconds   Time 8    Period Weeks    Status Not Met - increased fatigue noted   Target Date 10/08/21      PT LONG TERM GOAL #5   Title FOTO will be completed at d/c    Baseline 06/03/21 62; 51%    Time 8  Period Weeks    Status MET   Target Date 10/08/21              Plan - 08/17/21 0933     Clinical Impression Statement PT finished  assessment of patient's progress toward all LTGs. Patient able to meet LTG #5. Patient did not meet any other goals today, with patient reporting increased fatigue and sickness. Vitals stable throughout session. Due limited progress with PT services and lack of progress after extended PT services discharge per primary PT request. Encouraged patient to continue HEP. Patient agreeable.    Personal Factors and Comorbidities Comorbidity 3+    Comorbidities arthritis, asthma, depression, HTN, neuropathy, suicide attempt, DM    Examination-Activity Limitations Locomotion Level;Transfers;Stand;Stairs;Squat    Examination-Participation Restrictions Community Activity;Driving;Yard Work;Cleaning    Stability/Clinical Decision Making Evolving/Moderate complexity    Rehab Potential Good    PT Frequency 1x / week    PT Duration 8 weeks    PT Treatment/Interventions ADLs/Self Care Home Management;DME Instruction;Neuromuscular re-education;Manual techniques;Balance training;Therapeutic exercise;Therapeutic activities;Functional mobility training;Stair training;Gait training;Vestibular;Passive range of motion;Orthotic Fit/Training;Patient/family education    PT Next Visit Plan d/c this visit   Consulted and Agree with Plan of Care Patient              Jones Bales, PT, DPT Outpatient Neuro [facility] [address] [place], [place] [address] [phone] 10/05/21, 1:56 PM

## 2021-10-07 ENCOUNTER — Ambulatory Visit (INDEPENDENT_AMBULATORY_CARE_PROVIDER_SITE_OTHER): Payer: Medicare HMO | Admitting: Internal Medicine

## 2021-10-07 ENCOUNTER — Encounter: Payer: Self-pay | Admitting: Internal Medicine

## 2021-10-07 VITALS — BP 126/82 | HR 90 | Ht 63.0 in | Wt 180.2 lb

## 2021-10-07 DIAGNOSIS — J84112 Idiopathic pulmonary fibrosis: Secondary | ICD-10-CM | POA: Diagnosis not present

## 2021-10-07 DIAGNOSIS — J439 Emphysema, unspecified: Secondary | ICD-10-CM

## 2021-10-07 DIAGNOSIS — Z5181 Encounter for therapeutic drug level monitoring: Secondary | ICD-10-CM | POA: Diagnosis not present

## 2021-10-07 DIAGNOSIS — N189 Chronic kidney disease, unspecified: Secondary | ICD-10-CM | POA: Diagnosis not present

## 2021-10-07 DIAGNOSIS — D649 Anemia, unspecified: Secondary | ICD-10-CM

## 2021-10-07 LAB — HEPATIC FUNCTION PANEL
ALT: 16 U/L (ref 0–35)
AST: 22 U/L (ref 0–37)
Albumin: 4.1 g/dL (ref 3.5–5.2)
Alkaline Phosphatase: 58 U/L (ref 39–117)
Bilirubin, Direct: 0.2 mg/dL (ref 0.0–0.3)
Total Bilirubin: 0.8 mg/dL (ref 0.2–1.2)
Total Protein: 7.5 g/dL (ref 6.0–8.3)

## 2021-10-07 NOTE — Addendum Note (Signed)
Addended by: Lorretta Harp on: 10/07/2021 10:25 AM   Modules accepted: Orders

## 2021-10-07 NOTE — Progress Notes (Signed)
This is the case of Erica [name], 66 y.o. Female, who was referred by Dr. Lorella Nimrod in consultation regarding abnormal chest ct scan.   As you very well know, patient smoked 1 pack per week since her 55s, curently down to a cigarette a day, was diagnosed with asthma/copd several yrs ago. Uses alb MDI prn, usually 1-2 x/week. She gets more than SOB with more than ADLs. She is not on o2.   She had rectal CA in 2014 for which she had surgery. Still in remission.  She had a chest ct scan in 01/2015 as part of CA surveillance.  That showed several pulm nodules (subcm) which have been stable, and some smaller.  Also with increased markings at the bases.   She had sinus issues in December.  Wen tot ER as she was not getting better.  CXR showed inc interstitial markings, hence the consult today. She is almost back to her baseline.   She has GERD, controlled with meds. She coughs daily, coughs in sleep. Cough worse at night.  On and off wheezing at HS.   Her sinus issues are better. Has PND.    Has snoring, gaspin, choking, unrefreshed sleep.  Wakes up tired and fatigued.  ESS 10. Hypersomnia affects her fxnality.    ROV  08/12/16 Patient returns to the office as follow-up after her tests. Since last seen, she has quit smoking. She had PFT  which showed mild COPD with an FEV1 of 1. 72 or 84% of predicted. Diffusion capacity was 61%. Her sleep study was (-) for OSA. She is able to do her ADLs without much difficulty. Gets winded with more than ADLs. Her reflux is controlled with medicines and diet changes. Her cough is also stable.     09/06/2017 Follow up : COPD and ILD  Pt returns for 1 year follow up . She was last seen in March 2018 . Found to have mild COPD and ILD changes on CT chest that are mild worse than 2015 CT scan .  She had autoimmune labs that essentially neg. She did not keep follow up .  Recommended on smoking cessation.  Says that she has baseline dyspnea with activity  none at rest , and dry cough .  Over last year feels dyspnea with activity has gotten worse. She gets winded easily .  No increased cough .  Still smoking, cessation discussed.   OV 10/24/2017  Chief Complaint  Patient presents with   Follow-up    PFT was attempted today but pt unable to complete due to some sinus problems. Pt has some yellow to green mucus postnasally and also is coughing up a lot of mucus as well. Pt also states she has increased SOB due to symptoms.    Katanya Schlie presents to the interstitial lung disease clinic for evaluation of potential interstitial lung disease.  She is followed in the general pulmonary clinic as stated above.  Her other issues ongoing smoking.  She is somewhat of a poor historian and as soon as I walked in she reported a new complaint of acute sinus congestion for the last 2 weeks after having picked up a cold from her sister.  Also the ongoing spring pollen season is making things worse.  She is coughing up some green sputum.  She feels this needs to be treated by me and she wants an antibiotic for this.  There is no associated wheezing or cough or shortness of breath  because of this.  We discussed her underlying lung disease and she tells me that she is aware that she has COPD/emphysema.  But she is totally unaware about pulmonary fibrosis or interstitial lung disease.  She tells me that overall compared to last year she is stable and she only has mild dyspnea on exertion without much of a cough.  There is no chest pain no orthopnea proximal nocturnal dyspnea wheezing or hemoptysis or weight loss.  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  Only takes albuerol as needed.  Her last CT scan of the chest high resolution was in January 2018 that thoracic radiology Dr. Lorin Picket described as indicative of UIP.  I personally visualized the CT and I personally think this is indeterminate for UIP.  There is bilateral  bibasal craniocaudal gradients reticular abnormalities but I am not so sure that these are subpleural and more of I do not see clear-cut traction bronchiectasis or honeycombing although I will agree that there is no groundglass.   She has ongoing smoking    Results for [name], [name] (MRN [id number]) as of 10/24/2017 16:28  Ref. Range 08/12/2016 14:23 09/07/2016 11:20  Anit Nuclear Antibody(ANA) Latest Ref Range: NEGATIVE  NEG   ANCA Proteinase 3 Latest Ref Range: 0.0 - 3.5 U/mL  <3.5  Anti JO-1 Latest Ref Range: 0.0 - 0.9 AI <7.5 <8.8  Cyclic Citrullin Peptide Ab Latest Units: Units <16   ds DNA Ab Latest Units: IU/mL <1   Myeloperoxidase Ab Latest Ref Range: 0.0 - 9.0 U/mL  <9.0  Cytoplasmic (C-ANCA) Latest Ref Range: <1:20  1:40 (H)   Scleroderma (Scl-70) (ENA) Antibody, IgG Latest Ref Range: <1.0 NEG AI  <1.0 NEG        OV 04/08/2019  Subjective:  Patient ID: Erica Dunnaway, female , DOB: 19-Feb-1956 , age 48 y.o. , MRN: [id number] , ADDRESS: [place] [id number]   04/08/2019 -   Chief Complaint  Patient presents with   Follow-up    Patient reports that she still has sob with exertion and some cough. She states that some days are better than others.    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 -returns for follow-up.  Last seen by myself in May 2019.  At that point in time I ordered a high-resolution CT chest.  She had it but has not properly followed up.  She in between had a telephone visit with one of the nurse practitioners and was given COPD medication refill.  After that she had a visit with another nurse practitioner.  She tells me that in the last year she is quit smoking.  She is also quit working because of dyspnea on exertion relieved by rest.  She tells me that since last year her dyspnea is progressive.  She is able to climb 1 flight of stairs and then has to  stop because of extreme shortness of breath.  She is continuing with her inhalers.  She does not have nighttime or daytime oxygen.  Review of the CT scan and visualization of the CT scan from 1 year ago shows UIP features.  There are no other new issues.     CLINICAL DATA:  Interstitial lung disease.   EXAM: CT CHEST WITHOUT CONTRAST - 04/09/2018   TECHNIQUE: Multidetector CT imaging of the chest was performed following  the standard protocol without intravenous contrast. High resolution imaging of the lungs, as well as inspiratory and expiratory imaging, was performed.   COMPARISON:  07/06/2016, PET 05/14/2015 and CT chest 01/12/2015.   FINDINGS: Cardiovascular: Coronary artery calcification. Heart is at the upper limits of normal in size to mildly enlarged. No pericardial effusion.   Mediastinum/Nodes: Mediastinal lymph nodes measure up to 1.2 cm in the low right paratracheal station, as before. Hilar regions are difficult to evaluate without IV contrast. No axillary adenopathy. Mild esophageal dilatation. Esophagus is otherwise unremarkable.   Lungs/Pleura: Worsening peripheral and basilar predominant subpleural reticulation, ground-glass, traction bronchiectasis/bronchiolectasis and probable honeycombing. No air trapping. Perifissural nodules measure up to 8 x 13 mm along the left major fissure, minimally increased from 07/06/2016 with still felt to represent subpleural lymph nodes. No pleural fluid. Airway is unremarkable.   Upper Abdomen: Visualized portion of the liver is unremarkable. Stones layer in the gallbladder. Visualized portions of the adrenal glands, kidneys, spleen, pancreas, stomach and bowel are grossly unremarkable. There may be a small hiatal hernia. Upper abdominal lymph nodes are not enlarged by CT size criteria.   Musculoskeletal: Degenerative changes in the spine.   IMPRESSION: 1. Progressive pulmonary parenchymal pattern of fibrosis,  as described above. Findings are consistent with UIP per consensus guidelines: Diagnosis of Idiopathic Pulmonary Fibrosis: An Official ATS/ERS/JRS/ALAT Clinical Practice Guideline. [place], Iss 5, [phone], Feb 11 2017. 2. Cholelithiasis. 3. Coronary artery calcification.    ROS  OV 06/18/2019  Subjective:  Patient ID: Erica [name], female , DOB: 30-Oct-1955 , age 28 y.o. 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 -follow-up for her chest CT and PFT results.  In the interim denies any complaints.  Her symptom score is listed below.  She does not have any chest pain with exertion.  In talking to she only reports the symptoms.  I had to press her multiple different ways to really know if she understands what is going on in terms of etiologies.  After much conversation she currently at her COPD.  She had no idea about pulmonary fibrosis or interstitial lung disease.  I used several different colloquial terms.  This has been explained to her in the past.  But she understands she is got progressive shortness of breath and this is due to her lung disease.  So we went over the concept of pulmonary fibrosis again.  Her CT shows progressive UIP pattern of pulmonary fibrosis.  His previous serologies are negative.  She has coronary artery calcification and emphysema.  Her symptom burden is listed below.  Her walking desaturation test shows progression.  She is not had a stress test in a long time.  She does not have any chest pains  She says she had overnight oxygen study but we do not have those results with [place].          COMPARISON:  High-resolution chest CT 10/2  01/2018.    FINDINGS: Cardiovascular: Heart size is normal. There is no significant pericardial fluid, thickening or pericardial calcification. There is aortic atherosclerosis, as well as atherosclerosis of the great vessels of the  mediastinum and the coronary arteries, including calcified atherosclerotic plaque in the left anterior descending coronary arteries.   Mediastinum/Nodes: No pathologically enlarged mediastinal or hilar lymph nodes. Please note that accurate exclusion of hilar adenopathy is limited on noncontrast CT scans. Esophagus is unremarkable in appearance. No axillary lymphadenopathy.   Lungs/Pleura: High-resolution images demonstrate widespread patchy areas of ground-glass attenuation, septal thickening, thickening of the peribronchovascular interstitium, cylindrical bronchiectasis, peripheral bronchiolectasis and areas of honeycombing. These findings have a definitive craniocaudal gradient and arm minimally progressive compared to the prior study. Inspiratory and expiratory imaging is unremarkable. No acute consolidative airspace disease. No pleural effusions. No suspicious appearing pulmonary nodules or masses are noted.   Upper Abdomen: Partially calcified gallstone in the neck of the gallbladder.   Musculoskeletal: There are no aggressive appearing lytic or blastic lesions noted in the visualized portions of the skeleton.   IMPRESSION: 1. The appearance of the lungs remains compatible with interstitial lung disease, with a spectrum of findings considered diagnostic of usual interstitial pneumonia (UIP) per current ATS guidelines. Minimal progression compared to the prior study. 2. Aortic atherosclerosis, in addition to left anterior descending coronary artery disease. Please note that although the presence of coronary artery calcium documents the presence of coronary artery disease, the severity of this disease and any potential stenosis cannot be assessed  on this non-gated CT examination. Assessment for potential risk factor modification, dietary therapy or pharmacologic therapy may be warranted, if clinically indicated. 3. Cholelithiasis.   Aortic Atherosclerosis (ICD10-I70.0).     Electronically Signed   By: Vinnie Langton M.D.   On: 04/12/2019 16:30  xxxxxxxxxxxxxxxxxxxxxxxxxxxxxxxxxxxxxxxxxxxxxxxxxxxxxxxxxxxx     OV 08/15/2019  Subjective:  Patient ID: Erica [name], female , DOB: 22-Jun-1955 , age 62 y.o. , MRN: [id number] , ADDRESS: [place] [id number]   08/15/2019 -   Chief Complaint  Patient presents with   Follow-up    Pt states she is some better since last visit. Pt still becomes SOB with exertion and will have occ SOB if not doing anything. Pt also has an occ cough but denies any CP.   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  IPF diagnosis given June 18, 2019.  Commenced on pirfenidone January 2021.  Ex-smoker  GERD  Last CT scan of the chest October 2020.  HPI ROBERTA ANGELL 66 y.o. -presents for follow-up with her sister.  She feels she is slowly getting worse.  Last visit early January 2021 started on pirfenidone given the diagnosis of IPF.  Overnight pulse ox study was normal in December 2020 so she is not on oxygen.  She tells me that she feels she is getting slowly worse.  A few months ago she could climb the 20 steps to her apartment stopping only once.  Currently she is stopping 2 times.  Her sister is extremely worried about the apartment situation because it is on the first or second floor.  And there is one flight of stairs to climb.  Also after she started the pirfenidone she had tolerance issues with GI symptoms.  And she was reduced to 2 tablets 3 times daily.  She is now back up at 3 pills 3 times daily and is tolerating this just fine.  Symptom scores are listed below walking desaturation test suggest that she is indeed getting worse.  She is seeing Dr.  Einar Gip for coronary artery calcification.  Notes reviewed.    SYMPTOM SCALE - ILD 06/18/2019 188#  08/15/2019 195#  O2 use ra ra  Shortness of Breath 0 -> 5 scale with 5 being worst (score 6 If unable to do)   At rest 1 3  Simple tasks - showers, clothes change, eating, shaving 2 3  Household (dishes, doing bed, laundry) 2 4  Shopping 2 4  Walking level at own pace 2 4  Walking up Stairs 3 2  Total (40 - 48) Dyspnea Score 13 20  How bad is your cough? 4 x  How bad is your fatigue 4 4  nausea  3  vomit  3  diarrhea  3  anxiety  3  depression  2    2    Simple office walk 185 feet x  3 laps goal with forehead probe 10/24/2017  04/08/2019  06/18/2019 188# 08/15/2019   O2 used Room air Room air Room air Room air  Number laps completed _0 Comments about pace Normal pace   avgg  Resting Pulse Ox/HR 97% and 77/min 100% 95% and 91/min 96% and 101/min  Final Pulse Ox/HR 97% and 103/min 97% and 105/min 90% and 106/min 90% and 122/min  Desaturated </= 88% no no no no  Desaturated <= 3% points no Yes, 3 points Yes, 3 poitns Yes, 6 points  Got Tachycardic >/= 90/min yes yes yes yes  Symptoms at end of test No comment x No dyspnea Moderate duspnea  Miscellaneous comments No comment from CMA x x ? worse     Results for DAVEENA, ELMORE (MRN [id number]) as of 06/18/2019 12:12  Ref. Range 08/11/2016 10:13 10/24/2017 14:58 06/13/2019 08:56  FVC-Pre Latest Units: L 2.56 2.79 2.52  FVC-%Pred-Pre Latest Units: % 98 108 98    Results for JENETTE, RAYSON A (MRN [id number]) as of 06/18/2019 12:12  Ref. Range 08/11/2016 10:13 10/24/2017 14:58 06/13/2019 08:56  DLCO unc Latest Units: ml/min/mmHg 14.81  7.70  DLCO unc % pred Latest Units: % 61  38    ROS - per HPI    OV 09/03/2020  Subjective:  Patient ID: Erica [name], female , DOB: 1956/05/18 , age 6 y.o. , MRN: [id number] , ADDRESS: [address] [place] [address] PCP Nolene Ebbs, MD Patient Care Team: Nolene Ebbs, MD as PCP -  General (Internal Medicine) Truitt Merle, MD as Consulting Physician (Hematology) Leighton Ruff, MD as Consulting Physician (General Surgery) Arta Silence, MD as Consulting Physician (Gastroenterology)  This Provider for this visit: Treatment Team:  Attending Provider: Brand Males, MD    09/03/2020 -   Chief Complaint  Patient presents with   Follow-up    Pt states shortness of breath with activity, reports productive cough with improvement, goldish phlegm.     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  IPF diagnosis given June 18, 2019.  Commenced on pirfenidone January 2021.  Ex-smoker  GERD  Last CT scan of the chest October 2020.  HPI EMMAROSE KLINKE 66 y.o. -follow-up IPF with associated emphysema.  She is on pirfenidone 3 pills 3 times daily.  She is having some diarrhea and GI side effects.  Her companion was with her asked if they could reduce the dose.  Did indicate that reducing the dose can help reduce side effects but also with lowered benefit profile.  Therefore they decided to stay on the full dose.  From a respiratory standpoint she is stable as documented by the symptom score below but she is lost weight.  She had a  stroke in January 2022.  She is using a walker.  She feels numb on the face but on gross testing here not much of focal neurologic deficit other than mild pronator drift in the left upper extremity.  She is asking for refills on her inhalers.  We did discuss briefly the concept of clinical trials as a care option.  She indicated she will reflect upon this.           OV 04/28/2021  Subjective:  Patient ID: Erica [name], female , DOB: [birth year] , age 50 y.o. , MRN: [id number] , ADDRESS: [place] [id number] PCP Nolene Ebbs, MD Patient Care Team: Nolene Ebbs, MD as PCP - General (Internal Medicine) Truitt Merle, MD as Consulting Physician (Hematology) Leighton Ruff, MD as Consulting Physician  (General Surgery) Arta Silence, MD as Consulting Physician (Gastroenterology)  This Provider for this visit: Treatment Team:  Attending Provider: Brand Males, MD    04/28/2021 -   Chief Complaint  Patient presents with   Follow-up    Generic brand of Esbriet patient claims is not working as good.   GERD  Last CT scan of the chest October 2020.  HPI KESA BIRKY 66 y.o. -presents with her Sister [name].  She continues to use walker.  She is doing well.  She is on generic pirfenidone since May 2022.  She tells me the generic pirfenidone initially made her wheeze but then since then she has improved.  Review of the labs indicate that her chronic anemia last checked hemoglobin 10 g%.  She has chronic kidney disease with a GFR in the 30s and creatinine 1.7 mg percent.  She has been on this creatinine for a while.  Despite that she is tolerating the pirfenidone at full dose quite well.  The wheezes resolved.  Her shortness of breath is stable.  She is not using oxygen.  She had pulmonary function test in August 2022 and it shows stability.  Her last CT scan of the chest was in October 2020.  She has some diarrhea.     OV 10/07/2021  Subjective:  Patient ID: Erica Hannum, female , DOB: Mar 11, 1956 , age 51 y.o. , MRN: [id number] , ADDRESS: [address] [place] [address] PCP Nolene Ebbs, MD Patient Care Team: Nolene Ebbs, MD as PCP - General (Internal Medicine) Truitt Merle, MD as Consulting Physician (Hematology) Leighton Ruff, MD as Consulting Physician (General Surgery) Arta Silence, MD as Consulting Physician (Gastroenterology)  This Provider for this visit: Treatment Team:  Attending Provider: Brand Males, MD    10/07/2021 -   Chief Complaint  Patient presents with   Follow-up    F/U after CT scan. States her breathing has been stable since last visit.      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  IPF diagnosis given  June 18, 2019.  Commenced on pirfenidone January 2021.  Ex-smoker  HPI LAVINE HARGROVE 66 y.o. -returns for follow-up.  Presents with her Sister Luretha Rued.  Clear gifted me of any Vaseline after she saw me with a large Vaseline at last visit.  In any event patient is doing well.  She says overall she is stable.  ILD symptom score shows fluctuation but it seems compared to 2 years ago symptom score is stable.  She does have some amount of cough for the cough at night is improved.  Review of the labs indicate that her anemia is worsening chronic  kidney disease is worse.  At this point in time she is taking full dose pirfenidone.  Currently on 1 large pill 3 times daily.  Did explain to her about her worsening kidney function.  Even though she feels she is tolerating her pirfenidone well she actually has diarrhea.  We discussed the risk of worsening side effects with worsening renal function.  Therefore we took a shared decision making to reduce her pirfenidone.  She is fine with this.  We briefly discussed clinical trials as a care option and she is interested in this concept.  Did indicate to her that she might not qualify based on her medical issues.  She understands that.  We will refer her for clinical trials.  Last liver function test was in February 2023 and we will recheck this.    SYMPTOM SCALE - ILD 06/18/2019 188# 08/15/2019 195# 09/03/2020 187# 04/28/2021 183#  - esbriet 4/27/2023180#   O2 use _0   Shortness of Breath 0 -> 5 scale with 5 being worst (score 6 If unable to do)      At rest _1 Simple tasks - showers, clothes change, eating, shaving _2 Household (dishes, doing bed, laundry) _3 Shopping _4 Walking level at own pace _5 Walking up Stairs _6 Total (40 - 48) Dyspnea Score _7 How bad is your cough? 4 x _8 How bad is your fatigue _9 nausea  _10 vomit  _11 diarrhea  _12 anxiety   _13 depression  2 3 Did not answer _14 Simple office walk 185 feet x  3 laps goal with forehead probe 10/24/2017  04/08/2019  06/18/2019 188# 08/15/2019  09/03/2020  04/28/2021  Td   O2 used Room air Room air Room air Room air ra ra ra  Number laps completed _15 of 3 duie to stroke in jan and walker used 1 of 3 only due to walker and prior strke 1 lap  Comments about pace Normal pace   avgg     Resting Pulse Ox/HR 97% and 77/min 100% 95% and 91/min 96% and 101/min 100% and 63 99% and 73 98% and HR 90  Final Pulse Ox/HR 97% and 103/min 97% and 105/min 90% and 106/min 90% and 122/min 96% and 85 96% and 90 96% and HR 101  Desaturated </= 88% _16     Desaturated <= 3% points no Yes, 3 points Yes, 3 poitns Yes, 6 points yes    Got Tachycardic >/= 90/min yes yes yes yes no    Symptoms at end of test No comment x No dyspnea Moderate duspnea Mild dyspena    Miscellaneous comments No comment from CMA x x ? worse       Latest Reference Range & Units 01/24/08 17:00 08/21/08 21:29 10/26/09 19:51 11/06/12 13:55 11/14/12 13:05 01/23/13 08:30 01/30/13 04:05 01/31/13 04:15 02/01/13 04:33 02/02/13 04:30 02/03/13 04:28 02/05/13 04:18 02/07/13 04:47 02/08/13 08:45 04/18/14 15:00 09/26/14 10:12 10/28/14 14:24 01/12/15 14:44 04/08/15 09:28 07/29/15 12:06 11/02/15 10:17 01/26/16 09:42 06/02/16 20:17 08/05/16 11:17 08/17/16 11:45 03/02/17 14:28 08/30/17 09:05 08/29/18  09:15 05/09/19 01:33 05/11/19 22:20 10/30/19 16:57 04/23/20 13:54 06/25/20 15:25 06/25/20 17:00 06/25/20 17:24 06/28/20 03:16 06/30/20 05:13 07/01/20 07:05 07/03/20 05:29 07/06/20 05:14 07/07/20 05:34 07/10/20 07:48 07/14/20 04:48 07/29/20 00:00 11/03/20 16:32 11/04/20 04:18 11/05/20 03:04 11/06/20 02:30 11/09/20 05:39 11/13/20 04:58 11/17/20 05:01 11/20/20 12:51 11/30/20 15:05 03/08/21 11:44 04/28/21 11:59 07/16/21 00:00 09/24/21 15:30  Creatinine 0.57 - 1.00 mg/dL 1.09 1.18 1.21 (H) 1.46 (H) 1.36 (H) 1.12 (H) 1.47 (H) 1.14  (H) 1.70 (H) 1.26 (H) 1.15 (H) 1.23 (H) 1.51 (H) 1.19 (H) 1.10 1.3 (H) 1.2 (H) 1.5 (H) 1.7 (H) 1.6 (H) 2.0 (H) 1.9 (H) 1.30 (H) 1.2 (H) 1.3 (H) 1.7 (H) 1.36 (H) 1.38 (H) 1.51 (H) 1.69 (H) 1.31 (H) 1.42 (H) 1.79 (H) 1.90 (H) 1.90 (H) 1.54 (H) 1.74 (H) 1.91 (H) 1.87 (H) 1.71 (H) 1.80 (H) 1.78 (H) 1.91 (H) 1.53 (H) 1.80 (H) 1.70 (H) 1.63 (H) 1.71 (H) 1.78 (H) 1.73 (H) 1.74 (H) 1.78 (H) 1.50 (H) 1.72 (H) 1.42 (H) 1.68 (H) 1.79 (H)  (H): Data is abnormally high  Latest Reference Range & Units 01/24/08 17:00 08/21/08 21:29 11/06/12 13:55 11/14/12 13:05 01/23/13 08:30 01/30/13 04:05 01/31/13 04:15 02/01/13 04:33 02/02/13 04:30 02/03/13 04:28 02/07/13 04:47 02/08/13 08:45 04/18/14 15:00 09/26/14 10:12 10/28/14 14:24 01/12/15 14:43 04/08/15 09:28 07/29/15 12:06 11/02/15 10:17 01/26/16 09:42 06/02/16 20:17 08/05/16 11:17 08/17/16 11:45 03/02/17 14:28 08/30/17 09:05 08/29/18 09:15 05/09/19 01:33 06/25/20 15:25 06/25/20 17:00 06/25/20 17:24 06/28/20 03:16 07/01/20 07:05 07/06/20 05:14 07/08/20 05:07 07/09/20 05:05 07/10/20 07:48 07/13/20 12:24 07/29/20 00:00 11/03/20 16:32 11/04/20 04:18 11/05/20 03:04 11/06/20 02:30 11/09/20 05:39 11/10/20 05:52 11/13/20 04:58 11/17/20 05:01 11/30/20 15:05 04/28/21 11:59 07/16/21 00:00  Hemoglobin 11.7 - 15.5 g/dL 14.5 11.3 (L) 11.0 (L) 9.1 (L) 12.3 10.9 (L) 12.3 11.0 (L) 10.3 (L) 10.5 (L) 9.7 (L) 9.5 (L) 11.9 (L) 12.5 11.9 12.1 12.0 12.9 11.9 12.1 12.0 11.6 11.7 12.5 11.0 (L) 11.0 (L) 11.5 (L) 11.3 (L) 11.1 (L) 10.9 (L) 10.2 (L) 11.0 (L) 10.1 (L) 10.0 (L) 9.7 (L) 9.7 (L) 10.7 (L) 10.2 (L) 10.5 (L) 9.2 (L) 9.2 (L) 9.7 (L) 9.7 (L) 10.3 (L) 10.3 (L) 10.9 (L) 9.8 (L) 10.8 (L) 10.5 (L)  (L): Data is abnormally low CT Chest data  Latest Reference Range & Units 09/26/14 10:12 10/28/14 14:24 01/12/15 14:44 04/08/15 09:28 07/29/15 12:06 11/02/15 10:17 01/26/16 09:42 08/05/16 11:17 08/17/16 11:45 03/02/17 14:28 03/08/21 11:44 07/16/21 00:00 09/24/21 15:30  eGFR >59 mL/min/1.73 52 (L) 55 (L) 42  (L) 37 (L) 40 (L) 30 (L) 31 (L) 55 (L) 51 (L) 37 (L) 32 (L) 33 (L) 31 (L)  (L): Data is abnormally low CT Chest High Resolution  Result Date: 10/06/2021 CLINICAL DATA:  66 year old female with history of increasing shortness of breath with exertion. Evaluate for interstitial lung disease. EXAM: CT CHEST WITHOUT CONTRAST TECHNIQUE: Multidetector CT imaging of the chest was performed following the standard protocol without intravenous contrast. High resolution imaging of the lungs, as well as inspiratory and expiratory imaging, was performed. RADIATION DOSE REDUCTION: This exam was performed according to the departmental dose-optimization program which includes automated exposure control, adjustment of the mA and/or kV according to patient size and/or use of iterative reconstruction technique. COMPARISON:  Chest CT 04/12/2019. FINDINGS: Cardiovascular: Heart size is normal. There is no significant pericardial fluid, thickening or pericardial calcification. There is aortic atherosclerosis, as well as atherosclerosis of the great vessels of the mediastinum and the coronary arteries, including calcified atherosclerotic plaque in the left anterior  descending and right coronary arteries. Mediastinum/Nodes: No pathologically enlarged mediastinal or hilar lymph nodes. Please note that accurate exclusion of hilar adenopathy is limited on noncontrast CT scans. Esophagus is unremarkable in appearance. No axillary lymphadenopathy. Lungs/Pleura: High-resolution images demonstrate widespread areas of septal thickening, subpleural reticulation, parenchymal banding, traction bronchiectasis and honeycombing. These findings have a definitive craniocaudal gradient and are essentially stable compared to the prior examination. Inspiratory and expiratory imaging is unremarkable. No acute consolidative airspace disease. No pleural effusions. No suspicious appearing pulmonary nodules or masses are noted. Upper Abdomen: Tiny calcified  gallstones in the gallbladder, incompletely imaged. Musculoskeletal: There are no aggressive appearing lytic or blastic lesions noted in the visualized portions of the skeleton. IMPRESSION: 1. The appearance of the lungs is compatible with interstitial lung disease, with a spectrum of findings considered diagnostic of usual interstitial pneumonia (UIP) per current ATS guidelines. Findings appear stable compared to the prior examination, with no definite progression of disease. 2. Aortic atherosclerosis, in addition to 2 vessel coronary artery disease. Please note that although the presence of coronary artery calcium documents the presence of coronary artery disease, the severity of this disease and any potential stenosis cannot be assessed on this non-gated CT examination. Assessment for potential risk factor modification, dietary therapy or pharmacologic therapy may be warranted, if clinically indicated. 3. Cholelithiasis. Aortic Atherosclerosis (ICD10-I70.0). Electronically Signed   By: Vinnie Langton M.D.   On: 10/06/2021 08:46     PFT     Latest Ref Rng & Units 01/26/2021    2:55 PM 06/03/2020    2:47 PM 06/13/2019    8:56 AM 10/24/2017    2:58 PM 08/11/2016   10:13 AM  PFT Results  FVC-Pre L 2.79   2.59   2.52   2.79   2.56    FVC-Predicted Pre % 112   102   98   108   98    FVC-Post L   2.63    2.99    FVC-Predicted Post %   102    114    Pre FEV1/FVC % % 82   82   81   69   67    Post FEV1/FCV % %   81    67    FEV1-Pre L 2.29   2.12   2.05   1.93   1.72    FEV1-Predicted Pre % 117   107   102   95   84    FEV1-Post L   2.13    2.01    DLCO uncorrected ml/min/mmHg 7.71   8.38   7.70    14.81    DLCO UNC% % 39   42   38    61    DLCO corrected ml/min/mmHg 7.71   8.38     14.73    DLCO COR %Predicted % 39   42     60    DLVA Predicted % 50   53   47    66    TLC L   4.48    5.31    TLC % Predicted %   88    105    RV % Predicted %   75    136         has a past medical history of  Arthritis, Asthma, Depression, Diabetes mellitus, Embolic stroke ([place]) (12/04/7626), GERD (gastroesophageal reflux disease), Headache(784.0), Hypertension, Loop Biotronik loop implant 12/16/2020 (12/16/2020), Mental disorder, Neuropathy, Pain, Rectal polyp, Stroke ([place]) (  11/03/2020), and Suicide attempt ([place]).   reports that she quit smoking about 3 years ago. Her smoking use included cigarettes. She has a 10.00 pack-year smoking history. She has never used smokeless tobacco.  Past Surgical History:  Procedure Laterality Date   ANAL RECTAL MANOMETRY N/A 07/13/2016   Procedure: ANO RECTAL MANOMETRY;  Surgeon: Leighton Ruff, MD;  Location: [facility];  Service: Endoscopy;  Laterality: N/A;   CESAREAN SECTION     EUS N/A 11/21/2012   Procedure: LOWER ENDOSCOPIC ULTRASOUND (EUS);  Surgeon: Arta Silence, MD;  Location: [facility];  Service: Endoscopy;  Laterality: N/A;   FLEXIBLE SIGMOIDOSCOPY N/A 11/21/2012   Procedure: FLEXIBLE SIGMOIDOSCOPY;  Surgeon: Arta Silence, MD;  Location: [facility];  Service: Endoscopy;  Laterality: N/A;   FLEXIBLE SIGMOIDOSCOPY N/A 01/28/2013   Procedure: FLEXIBLE SIGMOIDOSCOPY;  Surgeon: Leighton Ruff, MD;  Location: [facility];  Service: Endoscopy;  Laterality: N/A;   LAPAROSCOPIC LOW ANTERIOR RESECTION N/A 01/29/2013   Procedure: LAPAROSCOPIC LOW ANTERIOR RESECTION, Rigid Proctoscopy;  Surgeon: Leighton Ruff, MD;  Location: [facility];  Service: General;  Laterality: N/A;   LAPAROSCOPIC SIGMOID COLECTOMY N/A 11/14/2012   Procedure: DIAGNOSTIC LAPAROSCOPY AND SIGMOIDMOIDOSCOPY ;  Surgeon: Rolm Bookbinder, MD;  Location: [facility];  Service: General;  Laterality: N/A;   RECTAL ULTRASOUND N/A 07/13/2016   Procedure: RECTAL ULTRASOUND;  Surgeon: Leighton Ruff, MD;  Location: [facility];  Service: Endoscopy;  Laterality: N/A;   TONSILLECTOMY     TONSILLECTOMY AND ADENOIDECTOMY      Allergies  Allergen Reactions   Penicillins Hives, Itching and Swelling    Tongue swells  up Has patient had a PCN reaction causing immediate rash, facial/tongue/throat swelling, SOB or lightheadedness with hypotension: Yes Has patient had a PCN reaction causing severe rash involving mucus membranes or skin necrosis: Yes Has patient had a PCN reaction that required hospitalization Yes Has patient had a PCN reaction occurring within the last 10 years: No If all of the above answers are "NO", then may proceed with Cephalosporin use.     Immunization History  Administered Date(s) Administered   Fluad Quad(high Dose 65+) 04/28/2021   Influenza Split 03/13/2017   Influenza Whole 04/26/2018   Influenza, High Dose Seasonal PF 02/29/2020   Influenza,inj,Quad PF,6+ Mos 03/09/2019   Influenza-Unspecified 03/13/2016   PFIZER(Purple Top)SARS-COV-2 Vaccination 08/19/2019, 09/18/2019    Family History  Problem Relation Age of Onset   Hypertension Father    Stroke Father    Diabetes Father    Heart disease Mother    Hypertension Mother    Hyperlipidemia Mother    Hypertension Sister    Hypertension Brother    Hypertension Sister    Hypertension Brother    Cancer Other 67       colon cancer    Cancer Maternal Aunt        cancer, unknown type    Cancer Maternal Uncle        bone cancer    Cancer Paternal Aunt        lung cancer   Cancer Paternal Uncle        lung cancer   Cancer Maternal Uncle        colon cancer and prostate cancer    Cancer Maternal Uncle        prostate cancer      Current Outpatient Medications:    acetaminophen (TYLENOL) 325 MG tablet, Take 2 tablets (650 mg total) by mouth every 4 (four) hours as needed for mild pain (or temp >  37.5 C (99.5 F))., Disp: , Rfl:    albuterol (VENTOLIN HFA) 108 (90 Base) MCG/ACT inhaler, Inhale 2 puffs into the lungs every 4 (four) hours as needed for wheezing., Disp: 1 g, Rfl: 5   amLODipine (NORVASC) 10 MG tablet, Take 1 tablet (10 mg total) by mouth daily., Disp: 30 tablet, Rfl: 2   aspirin EC 81 MG EC tablet,  Take 1 tablet (81 mg total) by mouth daily. Swallow whole., Disp: 30 tablet, Rfl: 11   clopidogrel (PLAVIX) 75 MG tablet, Take 75 mg by mouth daily., Disp: , Rfl:    diclofenac Sodium (VOLTAREN) 1 % GEL, Apply 2 g topically daily as needed (Knee pain)., Disp: 2 g, Rfl: 0   famotidine (PEPCID) 20 MG tablet, Take 1 tablet (20 mg total) by mouth daily., Disp: 30 tablet, Rfl: 0   fenofibrate (TRICOR) 145 MG tablet, TAKE 1 TABLET(145 MG) BY MOUTH DAILY, Disp: 90 tablet, Rfl: 3   gabapentin (NEURONTIN) 100 MG capsule, Take 2 capsules (200 mg total) by mouth 2 (two) times daily as needed (pain)., Disp: 60 capsule, Rfl: 0   hydrALAZINE (APRESOLINE) 25 MG tablet, Take by mouth., Disp: , Rfl:    insulin glargine (LANTUS SOLOSTAR) 100 UNIT/ML Solostar Pen, Please provide 24 units every a.m. and 20 units every p.m., Disp: 15 mL, Rfl: 11   Insulin Pen Needle (PEN NEEDLES) 32G X 6 MM MISC, 1 application by Does not apply route 2 (two) times daily., Disp: 100 each, Rfl: 1   Multiple Vitamin (MULTIVITAMIN ADULT PO), Take 1 tablet by mouth daily., Disp: , Rfl:    nortriptyline (PAMELOR) 25 MG capsule, Take 25 mg by mouth at bedtime., Disp: , Rfl:    pantoprazole (PROTONIX) 40 MG tablet, Take 40 mg by mouth daily., Disp: , Rfl:    Pirfenidone (ESBRIET) 801 MG TABS, Take 801 mg by mouth with breakfast, with lunch, and with evening meal., Disp: 270 tablet, Rfl: 1   rosuvastatin (CRESTOR) 20 MG tablet, TAKE 1 TABLET(20 MG) BY MOUTH DAILY, Disp: 90 tablet, Rfl: 0   sacubitril-valsartan (ENTRESTO) 49-51 MG, Take 1 tablet by mouth 2 (two) times daily., Disp: 60 tablet, Rfl: 0   sertraline (ZOLOFT) 50 MG tablet, Take 50 mg by mouth daily., Disp: , Rfl:    SYMBICORT 160-4.5 MCG/ACT inhaler, Inhale 2 puffs into the lungs 2 (two) times daily., Disp: 1 each, Rfl: 5   Tiotropium Bromide Monohydrate (SPIRIVA RESPIMAT) 1.25 MCG/ACT AERS, Inhale 2 puffs into the lungs daily., Disp: 4 g, Rfl: 5   traMADol (ULTRAM) 50 MG tablet,  Take 1 tablet (50 mg total) by mouth every 8 (eight) hours as needed for severe pain., Disp: 30 tablet, Rfl: 0   venlafaxine XR (EFFEXOR-XR) 75 MG 24 hr capsule, Take 1 capsule (75 mg total) by mouth daily with breakfast., Disp: 30 capsule, Rfl: 0      Objective:   Vitals:   10/07/21 0917  BP: 126/82  Pulse: 90  SpO2: 98%  Weight: 180 lb 3.2 oz (81.7 kg)  Height: _0  (1.6 m)    Estimated body mass index is 31.92 kg/m as calculated from the following:   Height as of this encounter: _1  (1.6 m).   Weight as of this encounter: 180 lb 3.2 oz (81.7 kg).  _2 @  [facility]   10/07/21 0917  Weight: 180 lb 3.2 oz (81.7 kg)     Physical Exam  General: No distress. Obese. Has cane Neuro: Alert and Oriented x 3.  GCS 15. Speech normal Psych: Pleasant Resp:  Barrel Chest - no.  Wheeze - no, Crackles - faint, No overt respiratory distress CVS: Normal heart sounds. Murmurs - no Ext: Stigmata of Connective Tissue Disease - no HEENT: Normal upper airway. PEERL +. No post nasal drip        Assessment:       ICD-10-CM   1. IPF (idiopathic pulmonary fibrosis) ([place])  J84.112     2. Pulmonary emphysema, unspecified emphysema type ([place])  J43.9     3. Encounter for therapeutic drug monitoring  Z51.81     4. Chronic kidney disease, unspecified CKD stage  N18.9     5. Anemia, unspecified type  D64.9          Plan:     Patient Instructions     ICD-10-CM   1. IPF (idiopathic pulmonary fibrosis) ([place])  J84.112     2. Pulmonary emphysema, unspecified emphysema type ([place])  J43.9     3. Encounter for therapeutic drug monitoring  Z51.81     4. Chronic kidney disease, unspecified CKD stage  N18.9     5. Anemia, unspecified type  D64.9        Clinically pulmonary fibrosis is stable.    There is some amount of diarrhea because of pirfenidone -but overall tolerating pirfenidone well  Anemia and kidney slowly getting worse  Plan -Check liver function test  today --Continue pirfenidone but reduce   - 1 large pill 2 times daily with food  - dose lowering due slowly worsening kidney function  --Continue inhaler therapy Spiriva and Symbicort scheduled for your emphysema   - do spirometry and dlco in 3 months  - will refer your name to [facility] for clinical trial consideration - appreciate your interest  - Thanks for the mini-vaseline  Follow-up -Return in 3 months to see Dr. Chase Caller but after PFT   Symptom sscore and walk test at followup       SIGNATURE    Dr. Brand Males, M.D., F.C.C.P,  Pulmonary and Critical Care Medicine Staff Physician, [place] Director - Interstitial Lung Disease  Program  [facility] [place], [place], [address]  Pager: [phone], If no answer or between  15:00h - 7:00h: call [phone] Telephone: [phone]  10:03 AM 10/07/2021

## 2021-10-07 NOTE — Patient Instructions (Addendum)
ICD-10-CM   1. IPF (idiopathic pulmonary fibrosis) ([place])  J84.112     2. Pulmonary emphysema, unspecified emphysema type ([place])  J43.9     3. Encounter for therapeutic drug monitoring  Z51.81     4. Chronic kidney disease, unspecified CKD stage  N18.9     5. Anemia, unspecified type  D64.9        Clinically pulmonary fibrosis is stable.    There is some amount of diarrhea because of pirfenidone -but overall tolerating pirfenidone well  Anemia and kidney slowly getting worse  Plan -Check liver function test today --Continue pirfenidone but reduce   - 1 large pill 2 times daily with food  - dose lowering due slowly worsening kidney function  --Continue inhaler therapy Spiriva and Symbicort scheduled for your emphysema   - do spirometry and dlco in 3 months  - will refer your name to [facility] for clinical trial consideration - appreciate your interest  - Thanks for the mini-vaseline  Follow-up -Return in 3 months to see Dr. Chase Caller but after PFT   Symptom sscore and walk test at followup

## 2021-10-08 NOTE — Progress Notes (Signed)
Called and discussed with pt. Pt reports that she recently got labs completed through her pulmonologist yesterday, but they were only checking for her liver function. Pt agreeable to going to labs later today or Monday. Will follow up once the labs are resulted.

## 2021-10-12 ENCOUNTER — Telehealth: Payer: Self-pay | Admitting: Pharmacist

## 2021-10-12 DIAGNOSIS — R0602 Shortness of breath: Secondary | ICD-10-CM

## 2021-10-12 LAB — BASIC METABOLIC PANEL
BUN/Creatinine Ratio: 16 (ref 12–28)
BUN: 28 mg/dL — ABNORMAL HIGH (ref 8–27)
CO2: 21 mmol/L (ref 20–29)
Calcium: 9.2 mg/dL (ref 8.7–10.3)
Chloride: 112 mmol/L — ABNORMAL HIGH (ref 96–106)
Creatinine, Ser: 1.72 mg/dL — ABNORMAL HIGH (ref 0.57–1.00)
Glucose: 118 mg/dL — ABNORMAL HIGH (ref 70–99)
Potassium: 5.4 mmol/L — ABNORMAL HIGH (ref 3.5–5.2)
Sodium: 145 mmol/L — ABNORMAL HIGH (ref 134–144)
eGFR: 32 mL/min/{1.73_m2} — ABNORMAL LOW (ref >59)

## 2021-10-12 LAB — MAGNESIUM: Magnesium: 1.8 mg/dL (ref 1.6–2.3)

## 2021-10-12 LAB — PRO B NATRIURETIC PEPTIDE: NT-Pro BNP: 149 pg/mL (ref 0–301)

## 2021-10-12 NOTE — Progress Notes (Signed)
Labs reviewed with pt following recent start Entresto 49/51 mg BID. Renal function improved slightly and appears to be close to pt's baselines of ~1.7. Sodium and potassium levels slightly above normal. Pt reports that she doesn't currently take any potassium supplements but does attest to high dietary potassium intake. Pt uses Mrs. Dash regularly on everything and eats spinach everyday. Possibly resulting in additive serum potassium level elevation in setting of new start Entresto. Pt willing to decrease her dietary potassium intake and getting repeat labs in 1 week. Pt also also states that she has been staying well hydrated with drinking 4-6 bottles of water/day. Denies any complains of significant muscle cramps, palpitations, SOB, worsening lower extremity edema concerns.

## 2021-10-12 NOTE — Telephone Encounter (Signed)
Labs reviewed with pt following recent start Entresto 49/51 mg BID. Renal function improved slightly and appears to be close to pt's baselines of ~1.7. Sodium and potassium levels slightly above normal. Pt reports that she doesn't currently take any potassium supplements but does attest to high dietary potassium intake. Pt uses Mrs. Dash regularly on everything and eats spinach everyday. Possibly resulting in additive serum potassium level elevation in setting of new start Entresto. Pt willing to decrease her dietary potassium intake and getting repeat labs in 1 week. Pt also also states that she has been staying well hydrated with drinking 4-6 bottles of water/day. Denies any complains of significant muscle cramps, palpitations, SOB, worsening lower extremity edema concerns.

## 2021-10-13 ENCOUNTER — Ambulatory Visit: Payer: Medicare HMO | Admitting: Physical Therapy

## 2021-10-14 ENCOUNTER — Other Ambulatory Visit: Payer: Self-pay | Admitting: Cardiology

## 2021-10-14 DIAGNOSIS — R0602 Shortness of breath: Secondary | ICD-10-CM

## 2021-10-18 ENCOUNTER — Other Ambulatory Visit: Payer: Self-pay | Admitting: Internal Medicine

## 2021-10-18 DIAGNOSIS — J439 Emphysema, unspecified: Secondary | ICD-10-CM

## 2021-10-19 ENCOUNTER — Encounter: Payer: Medicare HMO | Admitting: Physical Medicine & Rehabilitation

## 2021-10-28 ENCOUNTER — Ambulatory Visit: Payer: Medicare HMO | Admitting: Adult Health

## 2021-11-02 LAB — BASIC METABOLIC PANEL
BUN/Creatinine Ratio: 17 (ref 12–28)
BUN: 27 mg/dL (ref 8–27)
CO2: 20 mmol/L (ref 20–29)
Calcium: 9.6 mg/dL (ref 8.7–10.3)
Chloride: 108 mmol/L — ABNORMAL HIGH (ref 96–106)
Creatinine, Ser: 1.58 mg/dL — ABNORMAL HIGH (ref 0.57–1.00)
Glucose: 226 mg/dL — ABNORMAL HIGH (ref 70–99)
Potassium: 4.3 mmol/L (ref 3.5–5.2)
Sodium: 142 mmol/L (ref 134–144)
eGFR: 36 mL/min/{1.73_m2} — ABNORMAL LOW (ref >59)

## 2021-11-02 LAB — PRO B NATRIURETIC PEPTIDE: NT-Pro BNP: 309 pg/mL — ABNORMAL HIGH (ref 0–301)

## 2021-11-02 LAB — MAGNESIUM: Magnesium: 1.7 mg/dL (ref 1.6–2.3)

## 2021-11-11 DIAGNOSIS — F331 Major depressive disorder, recurrent, moderate: Secondary | ICD-10-CM | POA: Diagnosis not present

## 2021-11-12 NOTE — Progress Notes (Signed)
Tried calling patient no answer left a vm

## 2021-11-13 DIAGNOSIS — E1140 Type 2 diabetes mellitus with diabetic neuropathy, unspecified: Secondary | ICD-10-CM | POA: Diagnosis not present

## 2021-11-16 ENCOUNTER — Other Ambulatory Visit: Payer: Self-pay

## 2021-11-16 DIAGNOSIS — J84112 Idiopathic pulmonary fibrosis: Secondary | ICD-10-CM

## 2021-11-16 MED ORDER — PIRFENIDONE 801 MG PO TABS: 801.0000 mg | ORAL_TABLET | Freq: Three times a day (TID) | ORAL | 1 refills | Status: DC

## 2021-11-17 DIAGNOSIS — I10 Essential (primary) hypertension: Secondary | ICD-10-CM | POA: Diagnosis not present

## 2021-11-19 ENCOUNTER — Other Ambulatory Visit: Payer: Self-pay | Admitting: Cardiology

## 2021-11-19 DIAGNOSIS — I129 Hypertensive chronic kidney disease with stage 1 through stage 4 chronic kidney disease, or unspecified chronic kidney disease: Secondary | ICD-10-CM

## 2021-11-19 DIAGNOSIS — R0602 Shortness of breath: Secondary | ICD-10-CM

## 2021-11-19 DIAGNOSIS — N1832 Chronic kidney disease, stage 3b: Secondary | ICD-10-CM

## 2021-11-19 DIAGNOSIS — E1122 Type 2 diabetes mellitus with diabetic chronic kidney disease: Secondary | ICD-10-CM

## 2021-11-19 NOTE — Progress Notes (Signed)
Called and spoke to pt, pt voiced understanding.

## 2021-11-21 DIAGNOSIS — Z4509 Encounter for adjustment and management of other cardiac device: Secondary | ICD-10-CM | POA: Diagnosis not present

## 2021-11-21 DIAGNOSIS — I639 Cerebral infarction, unspecified: Secondary | ICD-10-CM | POA: Diagnosis not present

## 2021-11-21 DIAGNOSIS — Z95818 Presence of other cardiac implants and grafts: Secondary | ICD-10-CM | POA: Diagnosis not present

## 2021-11-22 ENCOUNTER — Emergency Department (HOSPITAL_COMMUNITY)
Admission: EM | Admit: 2021-11-22 | Discharge: 2021-11-23 | Disposition: A | Discharge status: Home / Self Care | Payer: Medicare HMO | Attending: Emergency Medicine | Admitting: Emergency Medicine

## 2021-11-22 ENCOUNTER — Emergency Department (HOSPITAL_COMMUNITY): Payer: Medicare HMO

## 2021-11-22 ENCOUNTER — Other Ambulatory Visit: Payer: Self-pay

## 2021-11-22 DIAGNOSIS — R4701 Aphasia: Secondary | ICD-10-CM | POA: Diagnosis not present

## 2021-11-22 DIAGNOSIS — Z794 Long term (current) use of insulin: Secondary | ICD-10-CM | POA: Diagnosis not present

## 2021-11-22 DIAGNOSIS — N289 Disorder of kidney and ureter, unspecified: Secondary | ICD-10-CM | POA: Insufficient documentation

## 2021-11-22 DIAGNOSIS — R200 Anesthesia of skin: Secondary | ICD-10-CM | POA: Diagnosis not present

## 2021-11-22 DIAGNOSIS — Z7982 Long term (current) use of aspirin: Secondary | ICD-10-CM | POA: Diagnosis not present

## 2021-11-22 DIAGNOSIS — R29818 Other symptoms and signs involving the nervous system: Secondary | ICD-10-CM | POA: Diagnosis not present

## 2021-11-22 DIAGNOSIS — R519 Headache, unspecified: Secondary | ICD-10-CM | POA: Diagnosis not present

## 2021-11-22 DIAGNOSIS — R531 Weakness: Secondary | ICD-10-CM

## 2021-11-22 DIAGNOSIS — I10 Essential (primary) hypertension: Secondary | ICD-10-CM | POA: Diagnosis not present

## 2021-11-22 DIAGNOSIS — Z79899 Other long term (current) drug therapy: Secondary | ICD-10-CM | POA: Insufficient documentation

## 2021-11-22 LAB — I-STAT CHEM 8, ED
BUN: 26 mg/dL — ABNORMAL HIGH (ref 8–23)
Calcium, Ion: 1.27 mmol/L (ref 1.15–1.40)
Chloride: 109 mmol/L (ref 98–111)
Creatinine, Ser: 2.2 mg/dL — ABNORMAL HIGH (ref 0.44–1.00)
Glucose, Bld: 84 mg/dL (ref 70–99)
HCT: 30 % — ABNORMAL LOW (ref 36.0–46.0)
Hemoglobin: 10.2 g/dL — ABNORMAL LOW (ref 12.0–15.0)
Potassium: 4.3 mmol/L (ref 3.5–5.1)
Sodium: 143 mmol/L (ref 135–145)
TCO2: 25 mmol/L (ref 22–32)

## 2021-11-22 LAB — CBC
HCT: 30.1 % — ABNORMAL LOW (ref 36.0–46.0)
Hemoglobin: 10.7 g/dL — ABNORMAL LOW (ref 12.0–15.0)
MCH: 32.7 pg (ref 26.0–34.0)
MCHC: 35.5 g/dL (ref 30.0–36.0)
MCV: 92 fL (ref 80.0–100.0)
Platelets: 198 10*3/uL (ref 150–400)
RBC: 3.27 MIL/uL — ABNORMAL LOW (ref 3.87–5.11)
RDW: 15.4 % (ref 11.5–15.5)
WBC: 4 10*3/uL (ref 4.0–10.5)
nRBC: 0 % (ref 0.0–0.2)

## 2021-11-22 LAB — COMPREHENSIVE METABOLIC PANEL
ALT: 25 U/L (ref 0–44)
AST: 36 U/L (ref 15–41)
Albumin: 3.7 g/dL (ref 3.5–5.0)
Alkaline Phosphatase: 46 U/L (ref 38–126)
Anion gap: 6 (ref 5–15)
BUN: 25 mg/dL — ABNORMAL HIGH (ref 8–23)
CO2: 24 mmol/L (ref 22–32)
Calcium: 9.5 mg/dL (ref 8.9–10.3)
Chloride: 110 mmol/L (ref 98–111)
Creatinine, Ser: 2 mg/dL — ABNORMAL HIGH (ref 0.44–1.00)
GFR, Estimated: 27 mL/min — ABNORMAL LOW (ref >60)
Glucose, Bld: 89 mg/dL (ref 70–99)
Potassium: 4.2 mmol/L (ref 3.5–5.1)
Sodium: 140 mmol/L (ref 135–145)
Total Bilirubin: 1 mg/dL (ref 0.3–1.2)
Total Protein: 7.2 g/dL (ref 6.5–8.1)

## 2021-11-22 LAB — DIFFERENTIAL
Abs Immature Granulocytes: 0.01 10*3/uL (ref 0.00–0.07)
Basophils Absolute: 0 10*3/uL (ref 0.0–0.1)
Basophils Relative: 0 %
Eosinophils Absolute: 0.1 10*3/uL (ref 0.0–0.5)
Eosinophils Relative: 2 %
Immature Granulocytes: 0 %
Lymphocytes Relative: 28 %
Lymphs Abs: 1.1 10*3/uL (ref 0.7–4.0)
Monocytes Absolute: 0.3 10*3/uL (ref 0.1–1.0)
Monocytes Relative: 8 %
Neutro Abs: 2.5 10*3/uL (ref 1.7–7.7)
Neutrophils Relative %: 62 %

## 2021-11-22 LAB — APTT: aPTT: 28 seconds (ref 24–36)

## 2021-11-22 LAB — PROTIME-INR
INR: 1.3 — ABNORMAL HIGH (ref 0.8–1.2)
Prothrombin Time: 15.6 seconds — ABNORMAL HIGH (ref 11.4–15.2)

## 2021-11-22 IMAGING — CT CT HEAD W/O CM
3 series · 15 of 47 positions shown, 18 images · non-contrast
Comparison: Head CT [DATE], MRI brain [DATE]

CLINICAL DATA: Increasing frequency of headaches, today with
left-sided numbness in the face and hand.



[Series 4: head 5.0 h30s · axial · 0.45mm/px · z∈[-87,+43]mm · 9 of 32 slices shown, 12 images]
[im 3/32  brain]
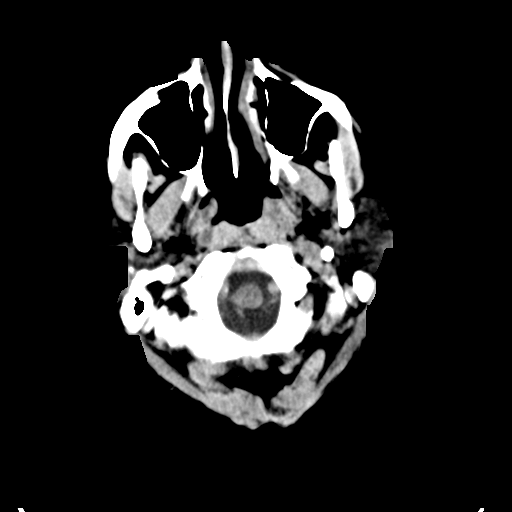
[im 3/32  bone]
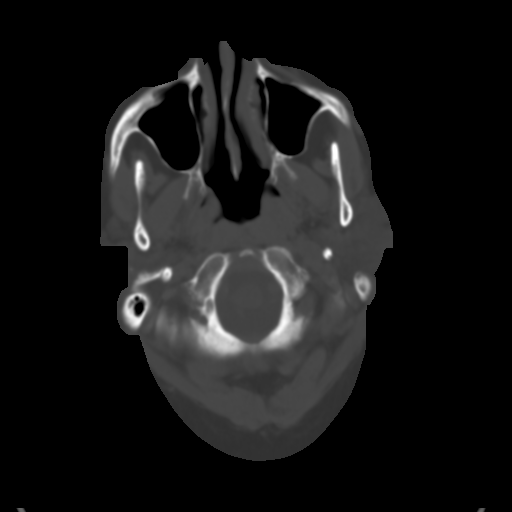
[im 6/32  brain]
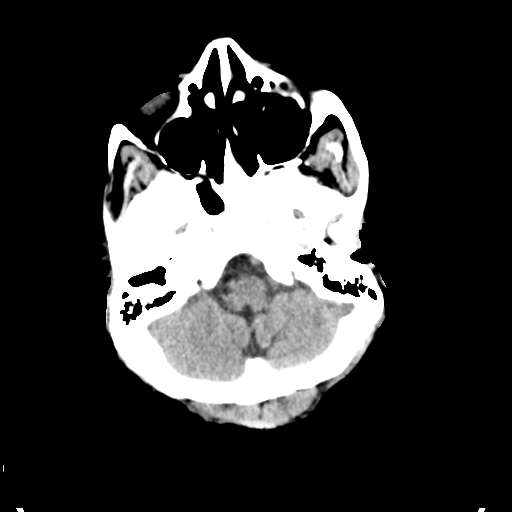
[im 9/32  brain]
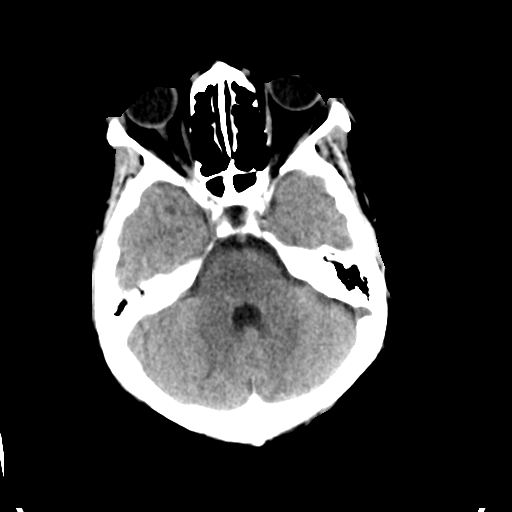
[im 12/32  brain]
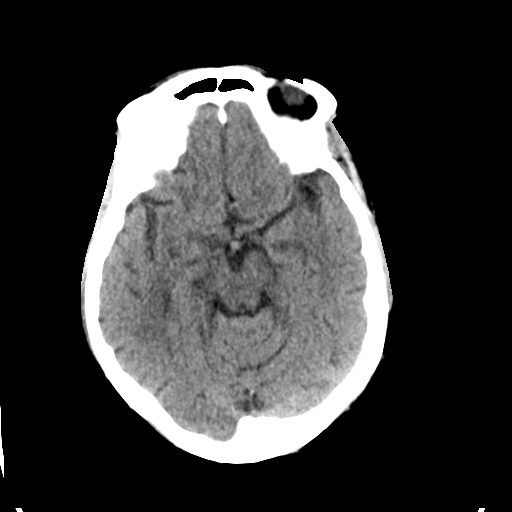
[im 17/32  brain]
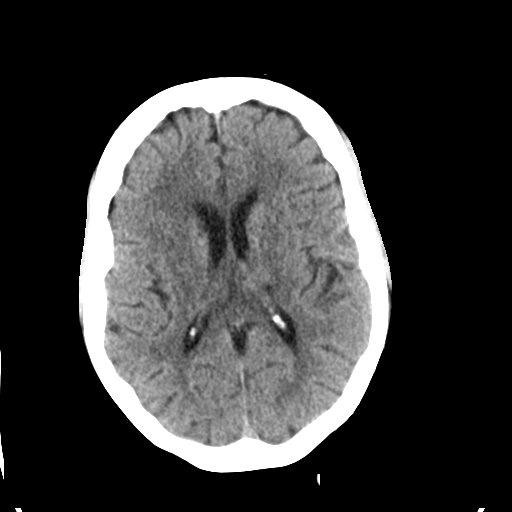
[im 17/32  bone]
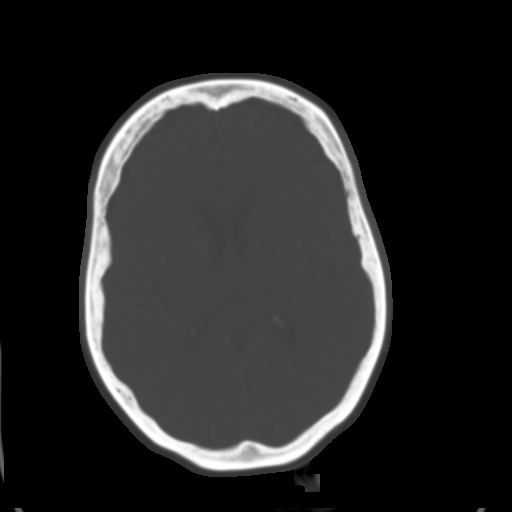
[im 20/32  brain]
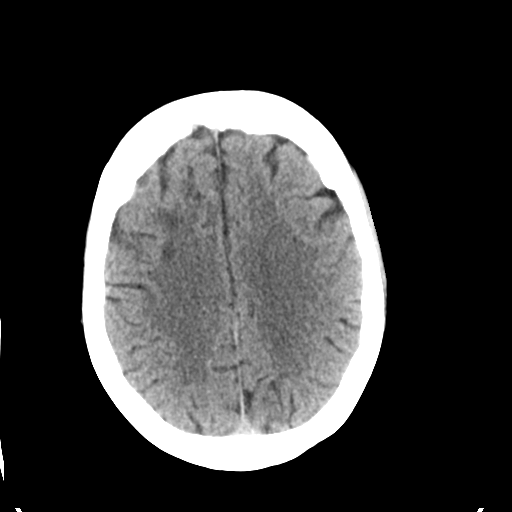
[im 23/32  brain]
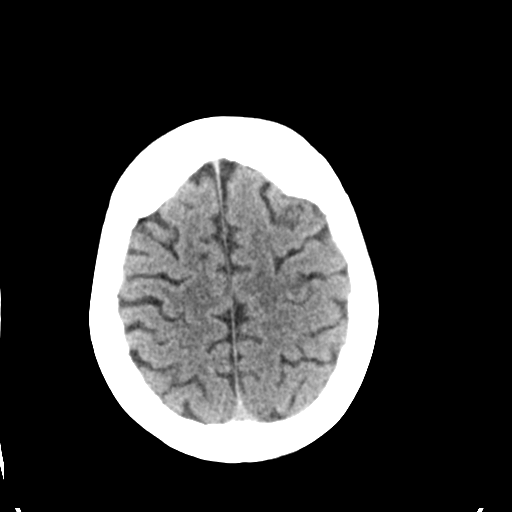
[im 26/32  brain]
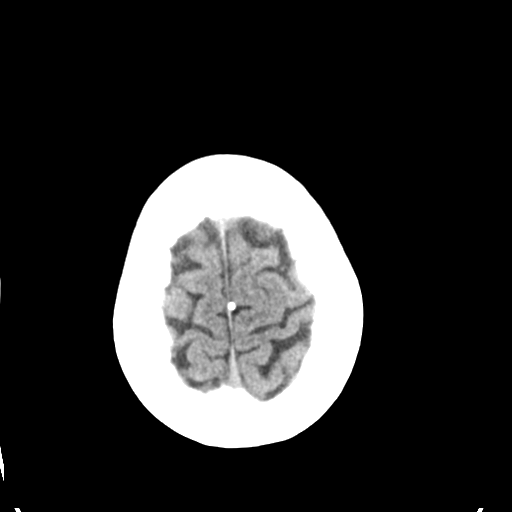
[im 29/32  brain]
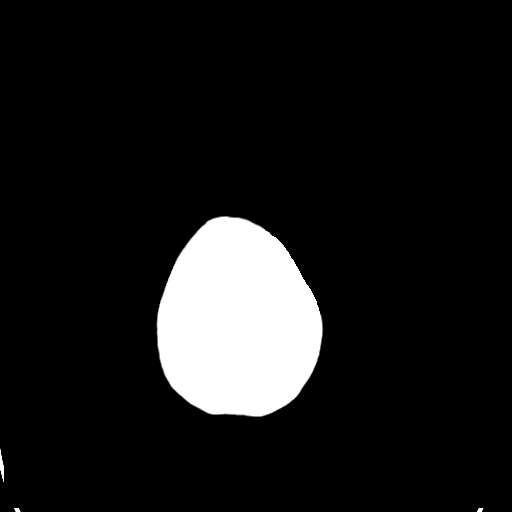
[im 29/32  bone]
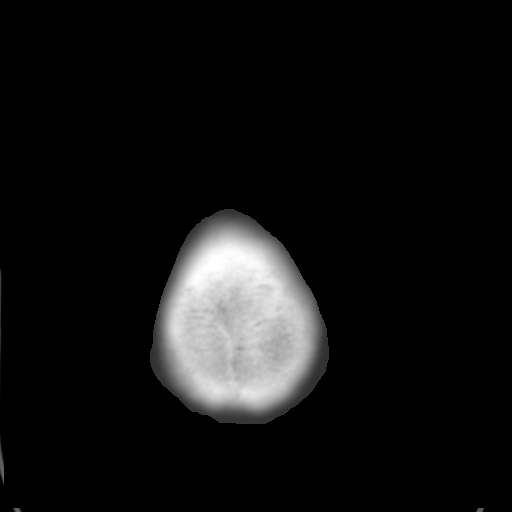

[Series 5: head 3.0 mpr cor · coronal · 0.33mm/px · 3 of 67 slices shown]
[im 23/67  brain]
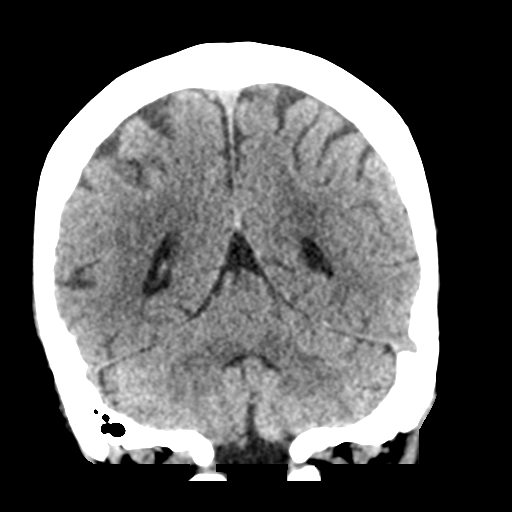
[im 30/67  brain]
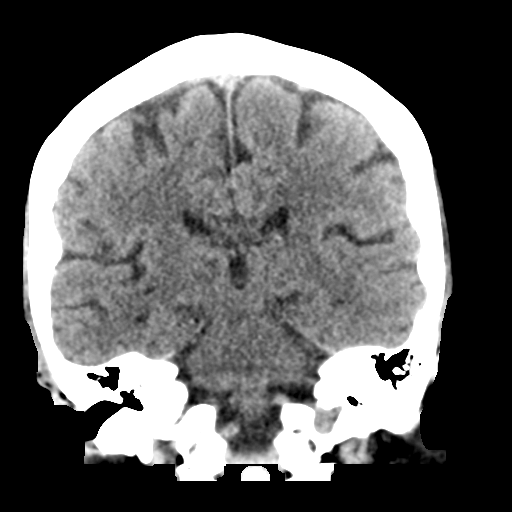
[im 37/67  brain]
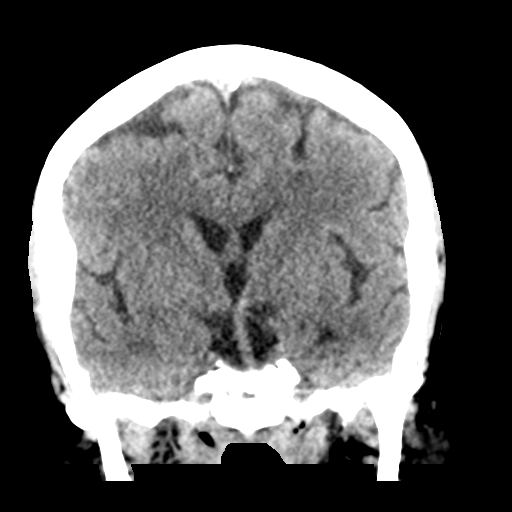

[Series 6: head 3.0 mpr sag · sagittal · 0.32mm/px · 3 of 54 slices shown]
[im 18/54  brain]
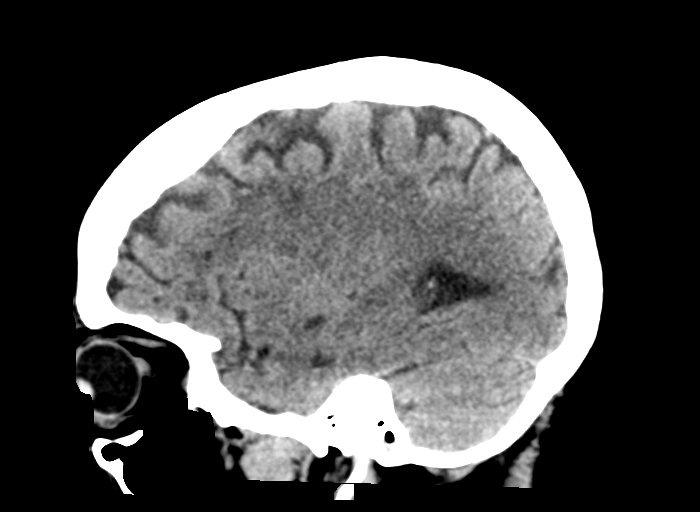
[im 27/54  brain]
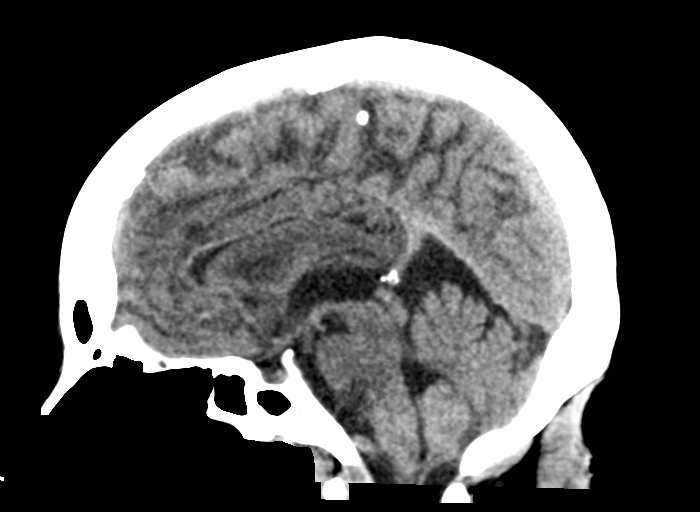
[im 36/54  brain]
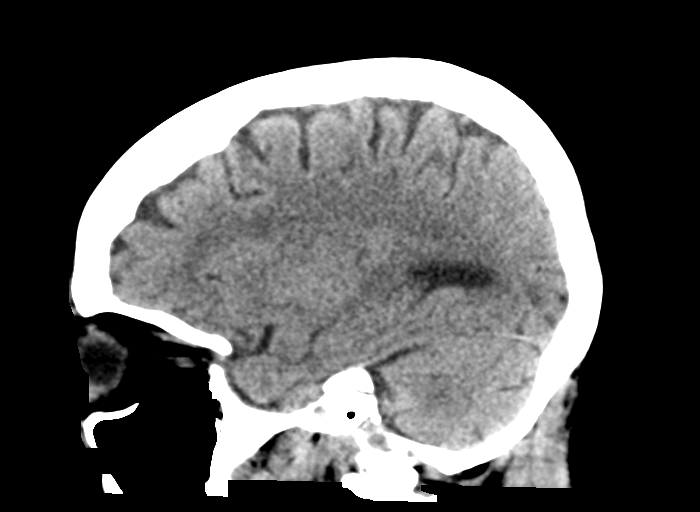

[15 of 47 positions shown; findings below may reference images not displayed]

FINDINGS: Brain: There is mild atrophy and small-vessel disease. Small
bilateral chronic gangliocapsular and thalamic lacunar infarcts are
noted with additional small chronic lacunar infarct in the left
frontoparietal deep white matter.

There are additional small chronic cortical/subcortical infarcts in
both posterior occipital lobes and both frontoparietal areas.

There is mild atrophy with mild-to-moderate small vessel disease of
the cerebral white matter. The ventricles are normal in size and
position.

No new asymmetry is seen worrisome for acute infarct, hemorrhage or
mass with additional small chronic lacunar infarctions in the right
cerebellar hemisphere.

Vascular: There are patchy calcifications in the carotid siphons. No
hyperdense central vessel is seen.

Skull: Normal. Negative for fracture or focal lesion.

Sinuses/Orbits: No acute finding.

Other: None.
IMPRESSION: Multiple small-vessel chronic infarcts, with atrophy and
small-vessel disease. No acute intracranial CT abnormality or
interval changes.

## 2021-11-22 MED ORDER — SODIUM CHLORIDE 0.9% FLUSH: 3.0000 mL | Freq: Once | INTRAVENOUS | Status: DC

## 2021-11-22 NOTE — ED Triage Notes (Signed)
Pt with hx of two strokes and residual left sided weakness here for eval of headache, numbness to L face and hand, and worsening L sided weakness-all symptoms she woke up with yesterday (6/11) morning. Has tried OTC meds for the headache without improvement. Pt states these are the same symptoms she had the last time she had a stroke. Pt takes a baby ASA daily, no other blood thinners.

## 2021-11-22 NOTE — ED Provider Triage Note (Signed)
Emergency Medicine Provider Triage Evaluation Note  Erica [name] , a 66 y.o. female  was evaluated in triage.  Pt complains of new onset neurodeficits which include: Left upper extremity numbness, decreased sensation to the left side of the face, mild aphasia, headache.  Hx of 2 prior strokes with residual left-sided lower extremity weakness.  Denies chest pain, shortness of breath, abdominal pain, N/V/D, fevers, recent infection.  Not on anticoagulation.  Review of Systems  Positive:  Negative: As above  Physical Exam  BP (!) 154/88   Pulse 74   Temp 98.9 F (37.2 C) (Oral)   Resp 16   SpO2 98%  Gen:   Awake, no distress   Resp:  Normal effort, CTAB Other:  Unilateral weakness of the left upper extremity.  Decreased sensation left side of the face.  Mild aphasia appreciated.  No facial asymmetry appreciated.  Left lower extremity weakness-this finding is at baseline per patient.  Medical Decision Making  Medically screening exam initiated at 6:09 PM.  Appropriate orders placed.  Erica [name] was informed that the remainder of the evaluation will be completed by another provider, this initial triage assessment does not replace that evaluation, and the importance of remaining in the ED until their evaluation is complete.     Prince Rome, PA-C 00/93/81 [id number]

## 2021-11-23 ENCOUNTER — Emergency Department (HOSPITAL_COMMUNITY): Payer: Medicare HMO

## 2021-11-23 DIAGNOSIS — N289 Disorder of kidney and ureter, unspecified: Secondary | ICD-10-CM | POA: Diagnosis not present

## 2021-11-23 DIAGNOSIS — R519 Headache, unspecified: Secondary | ICD-10-CM | POA: Diagnosis not present

## 2021-11-23 DIAGNOSIS — R4701 Aphasia: Secondary | ICD-10-CM | POA: Diagnosis not present

## 2021-11-23 DIAGNOSIS — R29818 Other symptoms and signs involving the nervous system: Secondary | ICD-10-CM | POA: Diagnosis not present

## 2021-11-23 IMAGING — MR MR HEAD W/O CM
12 of 13 series · 44 of 48 positions shown · non-contrast
Comparison: Head CT [DATE].  Brain MRI [DATE].

CLINICAL DATA: 66-year-old female with increasing headaches, mild
aphasia, left side neurologic deficits.

EXAM:
MRI HEAD WITHOUT CONTRAST
TECHNIQUE: Multiplanar, multiecho pulse sequences of the brain and surrounding
structures were obtained without intravenous contrast.

[Series 5: T1 · sagittal · 5.0mm · 0.75mm/px · 1 of 24 slices shown]
[im 1/24]
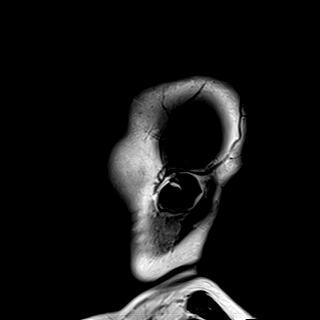

[Series 6: DWI · axial · 3.0mm · 0.88mm/px · z∈[-98,+46]mm · 8 of 100 slices shown (1 of 4)]
[im 1/100]
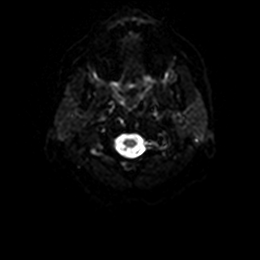
[im 15/100]
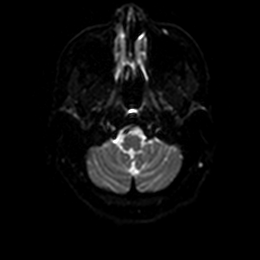
[im 29/100]
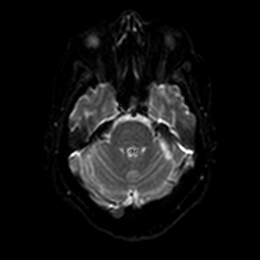
[im 43/100]
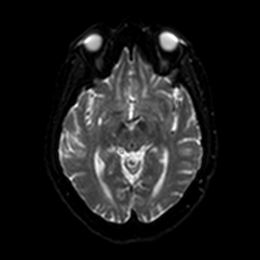
[im 57/100]
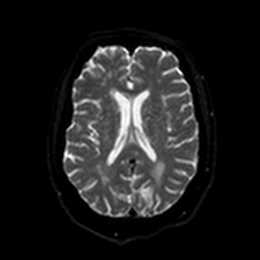
[im 71/100]
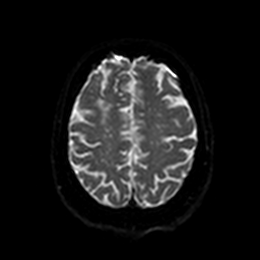
[im 85/100]
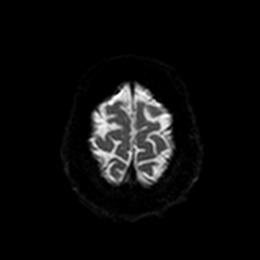
[im 100/100]
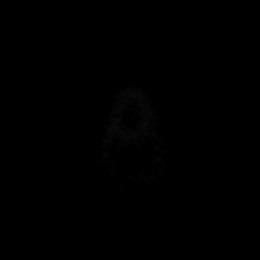

[Series 7: DWI · axial · 3.0mm · 0.88mm/px · z∈[-98,+46]mm · 4 of 48 slices shown (2 of 4)]
[im 1/48]
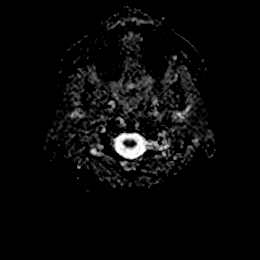
[im 16/48]
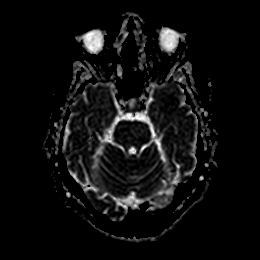
[im 32/48]
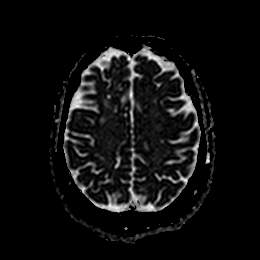
[im 48/48]
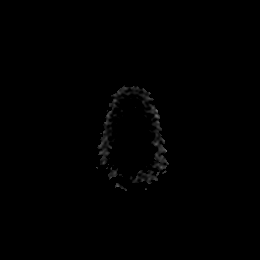

[Series 8: DWI · coronal · 4.0mm · 0.88mm/px · 6 of 70 slices shown (3 of 4)]
[im 1/70]
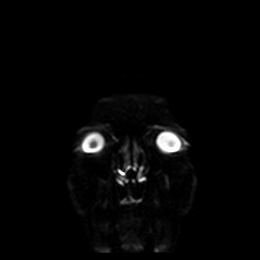
[im 14/70]
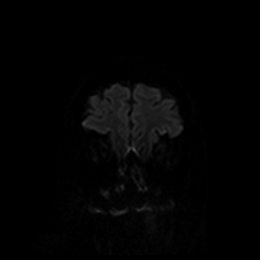
[im 28/70]
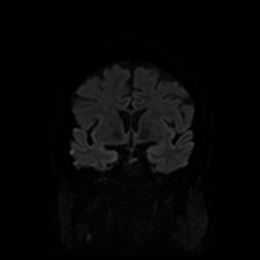
[im 42/70]
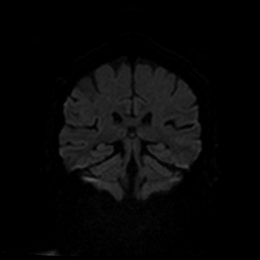
[im 56/70]
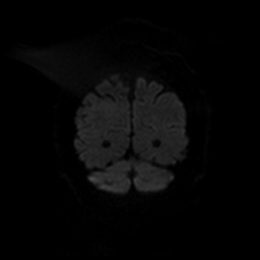
[im 70/70]
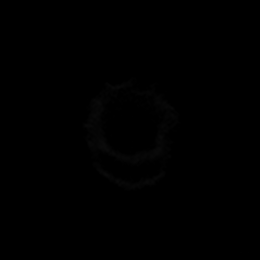

[Series 9: DWI · coronal · 4.0mm · 0.88mm/px · 3 of 35 slices shown (4 of 4)]
[im 1/35]
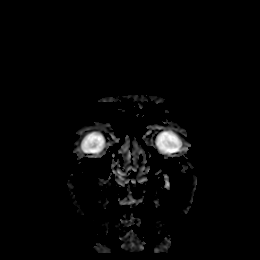
[im 18/35]
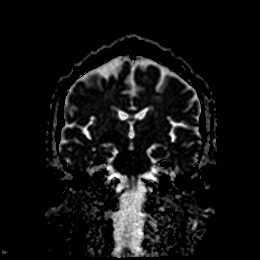
[im 35/35]
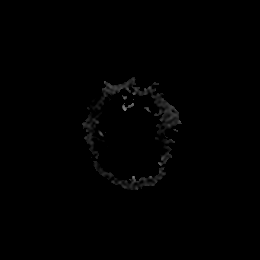

[Series 10: T2 · axial · 5.0mm · 0.72mm/px · z∈[-105,+35]mm · 2 of 25 slices shown (1 of 2)]
[im 1/25]
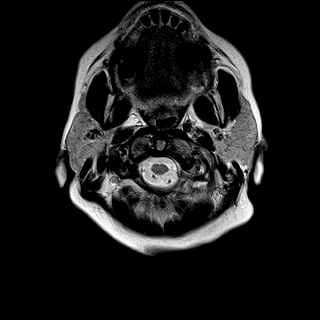
[im 25/25]
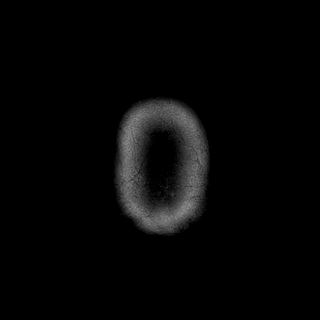

[Series 11: FLAIR · axial · 5.0mm · 0.45mm/px · z∈[-103,+37]mm · 2 of 25 slices shown]
[im 1/25]
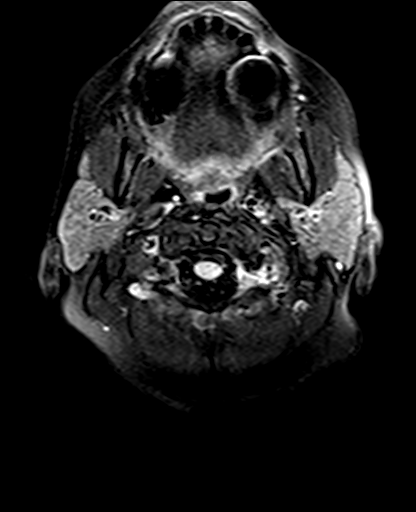
[im 25/25]
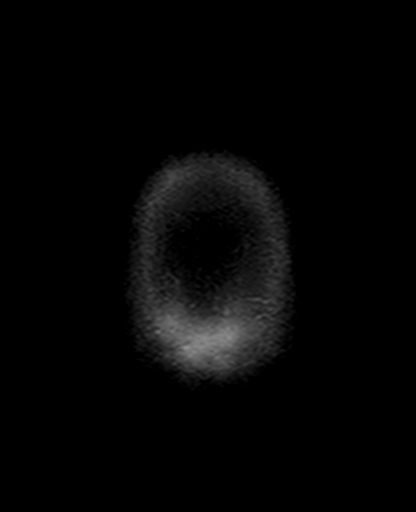

[Series 12: mag_images · axial · 3.0mm · 0.90mm/px · z∈[-107,+41]mm · 4 of 52 slices shown]
[im 1/52]
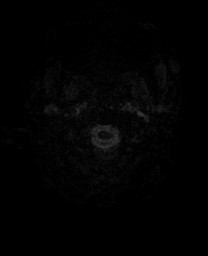
[im 18/52]
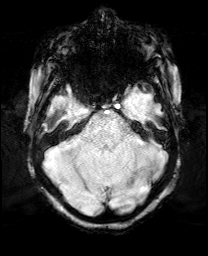
[im 35/52]
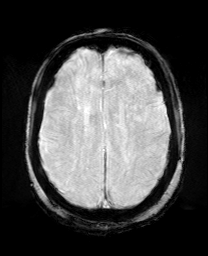
[im 52/52]
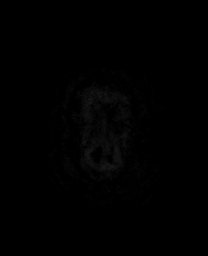

[Series 13: pha_images · axial · 3.0mm · 0.90mm/px · z∈[-107,+41]mm · 4 of 52 slices shown]
[im 1/52]
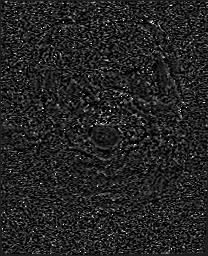
[im 18/52]
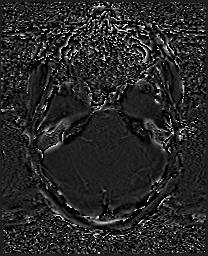
[im 35/52]
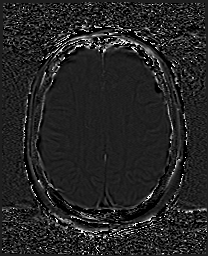
[im 52/52]
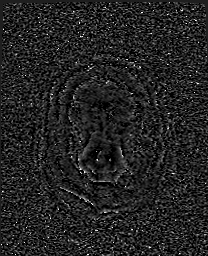

[Series 14: swi_images · axial · 3.0mm · 0.90mm/px · z∈[-107,+41]mm · 4 of 52 slices shown]
[im 1/52]
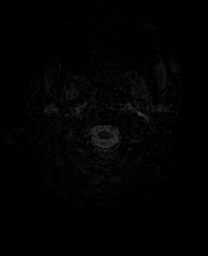
[im 18/52]
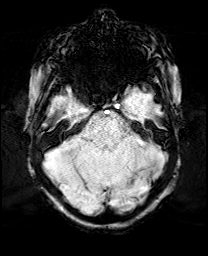
[im 35/52]
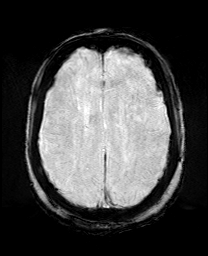
[im 52/52]
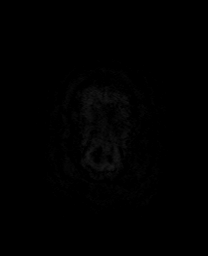

[Series 15: mip_images(sw) · axial · 24.0mm · 0.90mm/px · z∈[-97,+31]mm · 4 of 45 slices shown]
[im 1/45]
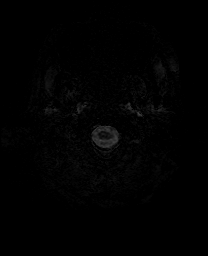
[im 15/45]
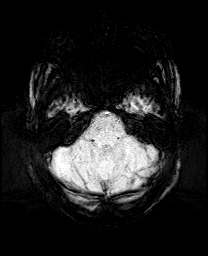
[im 30/45]
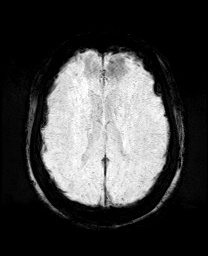
[im 45/45]
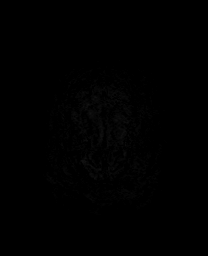

[Series 17: T2 · coronal · 5.0mm · 0.34mm/px · 2 of 29 slices shown (2 of 2)]
[im 1/29]
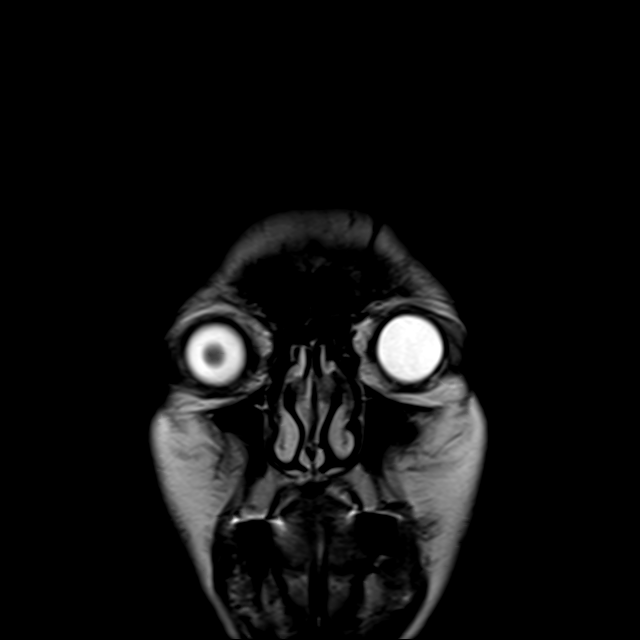
[im 29/29]
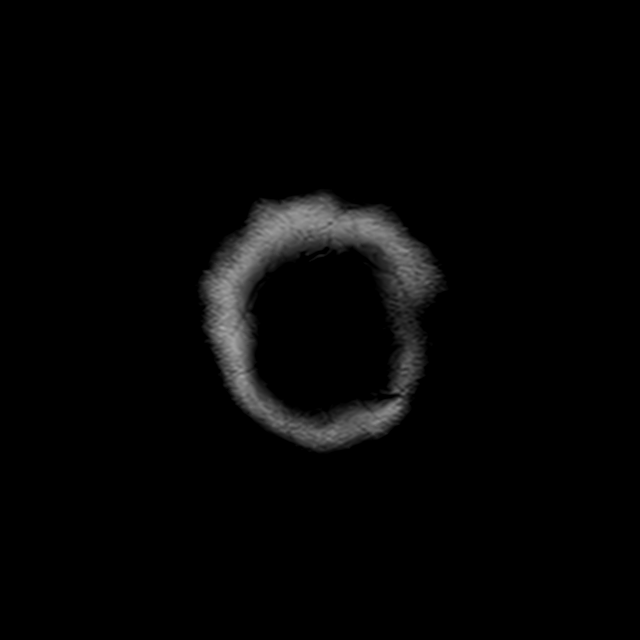

[44 of 48 positions shown; findings below may reference images not displayed]

FINDINGS: Brain: No restricted diffusion or evidence of acute infarction.
Scattered chronic infarcts in the bilateral cerebellum, bilateral
caudate, thalami greater on the right. Scattered small areas of
cortical and subcortical white matter encephalomalacia in the
bilateral MCA and PCA territories. Mild hemosiderin associated in
the left occipital pole. Overall stable gray and white matter signal
since last year.

No midline shift, mass effect, evidence of mass lesion,
ventriculomegaly, extra-axial collection or acute intracranial
hemorrhage. Cervicomedullary junction and pituitary are within
normal limits.

Vascular: Major intracranial vascular flow voids are stable since
last year.

Skull and upper cervical spine: Partially visible cervical spine
disc and endplate degeneration, including C5-C6 where mild
degenerative spinal stenosis is suspected. Visualized bone marrow
signal is within normal limits.

Sinuses/Orbits: Stable, negative.

Other: Grossly normal visible internal auditory structures. Negative
visible scalp and face.
IMPRESSION: 1. No acute intracranial abnormality.
2. Age advanced chronic ischemic disease with no significant
progression since the brain MRI last year.
3. Partially visible cervical spine degeneration with mild spinal
stenosis suspected at C5-C6.

## 2021-11-23 NOTE — Discharge Instructions (Addendum)
Please call dr Terri Skains about your entresto dosing since your kidney function is worsened,please have your kidney function rechecked in one week  Your caregiver has seen you today because you are having problems with feelings of weakness, dizziness, and/or fatigue. Weakness has many different causes, some of which are common and others are very rare. Your caregiver has considered some of the most common causes of weakness and feels it is safe for you to go home and be observed. Not every illness or injury can be identified during an emergency department visit, thus follow-up with your primary healthcare provider is important. Medical conditions can also worsen, so it is also important to return immediately as directed below, or if you have other serious concerns develop.  RETURN IMMEDIATELY IF  you develop new shortness of breath, chest pain, fever, have difficulty moving parts of your body (new weakness, numbness, or incoordination), sudden change in speech, vision, swallowing, or understanding, faint or develop new dizziness, severe headache, become poorly responsive or have an altered mental status compared to baseline for you, new rash, abdominal pain, or bloody stools,  Return sooner also if you develop new problems for which you have not talked to your caregiver but you feel may be emergency medical conditions.

## 2021-11-23 NOTE — ED Notes (Signed)
Pt ambulated hall with walker. Tolerated well

## 2021-11-23 NOTE — ED Provider Notes (Signed)
[facility] EMERGENCY DEPARTMENT Provider Note   CSN: [id number] Arrival date & time: 11/22/21  1721     History  Chief Complaint  Patient presents with   Stroke Symptoms    Erica [name] is a 66 y.o. female.  The history is provided by the patient and a relative.  Weakness Severity:  Moderate Onset quality:  Gradual Timing:  Constant Progression:  Unchanged Chronicity:  New Relieved by:  Nothing Worsened by:  Nothing Associated symptoms: difficulty walking and stroke symptoms   Associated symptoms: no chest pain, no fever, no shortness of breath and no vomiting   Patient with history of previous stroke, pulmonary fibrosis presents with weakness. Patient last known well on June 10 Patient reports she woke up the next day with left-sided facial numbness and throughout the day she noted weakness in the left leg.  She is having difficulty walking.  No new or severe neck or back pain.  No new chest pain or shortness of breath.  She reports medication compliance.  No new visual changes     Home Medications Prior to Admission medications   Medication Sig Start Date End Date Taking? Authorizing Provider  acetaminophen (TYLENOL) 325 MG tablet Take 2 tablets (650 mg total) by mouth every 4 (four) hours as needed for mild pain (or temp > 37.5 C (99.5 F)). 07/14/20  Yes Angiulli, Lavon Paganini, PA-C  albuterol (VENTOLIN HFA) 108 (90 Base) MCG/ACT inhaler Inhale 2 puffs into the lungs every 4 (four) hours as needed for wheezing. 11/20/20  Yes Angiulli, Lavon Paganini, PA-C  amLODipine (NORVASC) 10 MG tablet Take 1 tablet (10 mg total) by mouth daily. 11/20/20  Yes Angiulli, Lavon Paganini, PA-C  aspirin EC 81 MG EC tablet Take 1 tablet (81 mg total) by mouth daily. Swallow whole. 11/07/20  Yes Gonfa, Charlesetta Ivory, MD  ENTRESTO 49-51 MG TAKE 1 TABLET BY MOUTH TWICE DAILY Patient taking differently: Take 1 tablet by mouth 2 (two) times daily. 11/19/21  Yes Tolia, Sunit, DO  famotidine (PEPCID) 20 MG  tablet Take 1 tablet (20 mg total) by mouth daily. 11/20/20  Yes Angiulli, Lavon Paganini, PA-C  fenofibrate (TRICOR) 145 MG tablet TAKE 1 TABLET(145 MG) BY MOUTH DAILY Patient taking differently: Take 145 mg by mouth daily. 09/06/21  Yes Tolia, Sunit, DO  gabapentin (NEURONTIN) 100 MG capsule Take 2 capsules (200 mg total) by mouth 2 (two) times daily as needed (pain). Patient taking differently: Take 100 mg by mouth 2 (two) times daily. 11/20/20  Yes Angiulli, Lavon Paganini, PA-C  hydrALAZINE (APRESOLINE) 25 MG tablet Take 25 mg by mouth in the morning and at bedtime. 09/12/21  Yes [provider]  insulin glargine (LANTUS SOLOSTAR) 100 UNIT/ML Solostar Pen Please provide 24 units every a.m. and 20 units every p.m. Patient taking differently: Inject 18-24 Units into the skin See admin instructions. 24 units in the morning 18 units at bedtime 11/20/20  Yes Angiulli, Lavon Paganini, PA-C  Insulin Pen Needle (PEN NEEDLES) 32G X 6 MM MISC 1 application by Does not apply route 2 (two) times daily. 11/20/20  Yes Love, Ivan Anchors, PA-C  Multiple Vitamin (MULTIVITAMIN ADULT PO) Take 1 tablet by mouth daily. 07/11/19  Yes [provider]  pantoprazole (PROTONIX) 40 MG tablet Take 40 mg by mouth daily.   Yes [provider]  Pirfenidone (ESBRIET) 801 MG TABS Take 801 mg by mouth with breakfast, with lunch, and with evening meal. Patient taking differently: Take 801 mg by  mouth in the morning and at bedtime. 11/16/21  Yes Tolia, Sunit, DO  rosuvastatin (CRESTOR) 20 MG tablet TAKE 1 TABLET(20 MG) BY MOUTH DAILY Patient taking differently: Take 20 mg by mouth daily. 10/04/21  Yes Tolia, Sunit, DO  sertraline (ZOLOFT) 50 MG tablet Take 50 mg by mouth daily.   Yes [provider]  SYMBICORT 160-4.5 MCG/ACT inhaler INHALE 2 PUFFS INTO THE LUNGS TWICE DAILY 10/18/21  Yes Brand Males, MD  Tiotropium Bromide Monohydrate (SPIRIVA RESPIMAT) 1.25 MCG/ACT AERS Inhale 2 puffs into the lungs daily. 05/17/21  Yes  Brand Males, MD  venlafaxine XR (EFFEXOR-XR) 150 MG 24 hr capsule Take 150 mg by mouth daily. 09/19/21  Yes [provider]      Allergies    Penicillins    Review of Systems   Review of Systems  Constitutional:  Negative for fever.  Respiratory:  Negative for shortness of breath.   Cardiovascular:  Negative for chest pain.  Gastrointestinal:  Negative for vomiting.  Neurological:  Positive for weakness.    Physical Exam Updated Vital Signs BP (!) 157/97   Pulse 66   Temp 98.9 F (37.2 C) (Oral)   Resp 17   SpO2 100%  Physical Exam CONSTITUTIONAL:  elderly, no acute distress HEAD: Normocephalic/atraumatic EYES: EOMI/PERRL ENMT: Mucous membranes moist NECK: supple no meningeal signs SPINE/BACK:entire spine nontender CV: S1/S2 noted LUNGS: no apparent distress ABDOMEN: soft, nontender, no rebound or guarding, bowel sounds noted throughout abdomen GU:no cva tenderness NEURO: Pt is awake/alert/appropriate Mild left facial droop. She reports sensory deficit left face She has drift of the left lower extremity.  No arm drift is noted. EXTREMITIES: pulses normal/equalx4, full ROM SKIN: warm, color normal PSYCH: no abnormalities of mood noted, alert and oriented to situation  ED Results / Procedures / Treatments   Labs (all labs ordered are listed, but only abnormal results are displayed) Labs Reviewed  PROTIME-INR - Abnormal; Notable for the following components:      Result Value   Prothrombin Time 15.6 (*)    INR 1.3 (*)    All other components within normal limits  CBC - Abnormal; Notable for the following components:   RBC 3.27 (*)    Hemoglobin 10.7 (*)    HCT 30.1 (*)    All other components within normal limits  COMPREHENSIVE METABOLIC PANEL - Abnormal; Notable for the following components:   BUN 25 (*)    Creatinine, Ser 2.00 (*)    GFR, Estimated 27 (*)    All other components within normal limits  I-STAT CHEM 8, ED - Abnormal; Notable for  the following components:   BUN 26 (*)    Creatinine, Ser 2.20 (*)    Hemoglobin 10.2 (*)    HCT 30.0 (*)    All other components within normal limits  APTT  DIFFERENTIAL    EKG EKG Interpretation  Date/Time:  Monday November 22 2021 18:04:05 EDT Ventricular Rate:  79 PR Interval:  164 QRS Duration: 76 QT Interval:  386 QTC Calculation: 442 R Axis:   -11 Text Interpretation: Normal sinus rhythm Cannot rule out Anterior infarct , age undetermined Abnormal ECG No significant change since last tracing Interpretation limited secondary to artifact Confirmed by Ripley Fraise [phone] on 11/22/2021 11:49:42 PM  Radiology MR BRAIN WO CONTRAST  Result Date: 11/23/2021 CLINICAL DATA:  66 year old female with increasing headaches, mild aphasia, left side neurologic deficits. EXAM: MRI HEAD WITHOUT CONTRAST TECHNIQUE: Multiplanar, multiecho pulse sequences of the brain and surrounding structures  were obtained without intravenous contrast. COMPARISON:  Head CT 11/22/2021.  Brain MRI 11/03/2020. FINDINGS: Brain: No restricted diffusion or evidence of acute infarction. Scattered chronic infarcts in the bilateral cerebellum, bilateral caudate, thalami greater on the right. Scattered small areas of cortical and subcortical white matter encephalomalacia in the bilateral MCA and PCA territories. Mild hemosiderin associated in the left occipital pole. Overall stable gray and white matter signal since last year. No midline shift, mass effect, evidence of mass lesion, ventriculomegaly, extra-axial collection or acute intracranial hemorrhage. Cervicomedullary junction and pituitary are within normal limits. Vascular: Major intracranial vascular flow voids are stable since last year. Skull and upper cervical spine: Partially visible cervical spine disc and endplate degeneration, including C5-C6 where mild degenerative spinal stenosis is suspected. Visualized bone marrow signal is within normal limits. Sinuses/Orbits:  Stable, negative. Other: Grossly normal visible internal auditory structures. Negative visible scalp and face. IMPRESSION: 1. No acute intracranial abnormality. 2. Age advanced chronic ischemic disease with no significant progression since the brain MRI last year. 3. Partially visible cervical spine degeneration with mild spinal stenosis suspected at C5-C6. Electronically Signed   By: Genevie Ann M.D.   On: 11/23/2021 04:10   CT HEAD WO CONTRAST  Result Date: 11/22/2021 CLINICAL DATA:  Increasing frequency of headaches, today with left-sided numbness in the face and hand. EXAM: CT HEAD WITHOUT CONTRAST TECHNIQUE: Contiguous axial images were obtained from the base of the skull through the vertex without intravenous contrast. RADIATION DOSE REDUCTION: This exam was performed according to the departmental dose-optimization program which includes automated exposure control, adjustment of the mA and/or kV according to patient size and/or use of iterative reconstruction technique. COMPARISON:  Head CT 06/25/2020, MRI brain 11/03/2020 FINDINGS: Brain: There is mild atrophy and small-vessel disease. Small bilateral chronic gangliocapsular and thalamic lacunar infarcts are noted with additional small chronic lacunar infarct in the left frontoparietal deep white matter. There are additional small chronic cortical/subcortical infarcts in both posterior occipital lobes and both frontoparietal areas. There is mild atrophy with mild-to-moderate small vessel disease of the cerebral white matter. The ventricles are normal in size and position. No new asymmetry is seen worrisome for acute infarct, hemorrhage or mass with additional small chronic lacunar infarctions in the right cerebellar hemisphere. Vascular: There are patchy calcifications in the carotid siphons. No hyperdense central vessel is seen. Skull: Normal. Negative for fracture or focal lesion. Sinuses/Orbits: No acute finding. Other: None. IMPRESSION: Multiple  small-vessel chronic infarcts, with atrophy and small-vessel disease. No acute intracranial CT abnormality or interval changes. Electronically Signed   By: Telford Nab M.D.   On: 11/22/2021 20:59    Procedures Procedures    Medications Ordered in ED Medications  sodium chloride flush (NS) 0.9 % injection 3 mL (has no administration in time range)    ED Course/ Medical Decision Making/ A&P Clinical Course as of 11/23/21 0535  Mon Nov 22, 2021  2348 Creatinine(!): 2.20 Acute kidney injury [DW]  Tue Nov 23, 2021  0112 Discussed case with dr Cheral Marker with neuro.  He recommends mri brain - if negatie she can be discharged home.  Pt has loop recorder but mri team reports pt can undergo mri  [DW]  0534 Patient improved, walk around the ER no distress.  MRI is negative for acute stroke.  She is requesting discharge. [DW]  [phone] Patient was informed of her increased creatinine, will need to adjust her Delene Loll and talk to her cardiologist.  This will need to be rechecked in 1  week, patient understands that [DW]    Clinical Course User Index [DW] Ripley Fraise, MD                           Medical Decision Making Amount and/or Complexity of Data Reviewed Labs: ordered. Decision-making details documented in ED Course. Radiology: ordered.   This patient presents to the ED for concern of weakness, this involves an extensive number of treatment options, and is a complaint that carries with it a high risk of complications and morbidity.  The differential diagnosis includes but is not limited to CVA, intracranial hemorrhage, electrolyte imbalance, acute coronary syndrome  Comorbidities that complicate the patient evaluation: Patient's presentation is complicated by their history of CVA and pulmonary fibrosis   Additional history obtained: Additional history obtained from family Records reviewed previous admission documents  Lab Tests: I Ordered, and personally interpreted labs.  The  pertinent results include: Acute kidney injury  Imaging Studies ordered: I ordered imaging studies including CT scan head   I independently visualized and interpreted imaging which showed no acute finding I agree with the radiologist interpretation  MRI brain is negative for acute process  Cardiac Monitoring: The patient was maintained on a cardiac monitor.  I personally viewed and interpreted the cardiac monitor which showed an underlying rhythm of:  sinus rhythm  Test Considered: Considered admitting patient, but since patient is improved and negative MRI she will be discharged home   Consultations Obtained: I requested consultation with the consultant Dr. Cheral Marker with neurology , and discussed  findings as well as pertinent plan - they recommend: MRI brain  Reevaluation: After the interventions noted above, I reevaluated the patient and found that they have :improved  Complexity of problems addressed: Patient's presentation is most consistent with  acute presentation with potential threat to life or bodily function  Disposition: After consideration of the diagnostic results and the patient's response to treatment,  I feel that the patent would benefit from discharge   .           Final Clinical Impression(s) / ED Diagnoses Final diagnoses:  Weakness  Renal insufficiency    Rx / DC Orders ED Discharge Orders     None         Ripley Fraise, MD 11/23/21 [phone]

## 2021-11-23 NOTE — ED Notes (Signed)
PT ambulatory with walker to bathroom

## 2021-11-25 ENCOUNTER — Encounter: Payer: Medicare HMO | Admitting: *Deleted

## 2021-11-25 DIAGNOSIS — J84112 Idiopathic pulmonary fibrosis: Secondary | ICD-10-CM

## 2021-11-25 DIAGNOSIS — R6889 Other general symptoms and signs: Secondary | ICD-10-CM | POA: Diagnosis not present

## 2021-11-25 DIAGNOSIS — Z006 Encounter for examination for normal comparison and control in clinical research program: Secondary | ICD-10-CM

## 2021-11-25 NOTE — Progress Notes (Signed)
Erica [name], DOB 02-Mar-1956,was seen as subject in a clinical trial /Protocol #[id number] on 25 November 2021 Cardiopulmonary & neuromuscular symptoms are stable Other or new symptoms denied. Pertinent physical findings include:Exotropia OS;Grade 1 systolic murmur R base; accentuated S2 > S1; clubbing of nailbeds;rales @  lower 1/2 of lungs posteriorly;LLE foot drop;LUE weaker than RUE to opposition; scattered ,irregular hyperpigmented scarring BUE  (prurigo nodularis suggested clinically) All physical findings NCS                                                                     Hendricks Limes MD,SI

## 2021-11-25 NOTE — Research (Signed)
Title:A Randomized, Double-blind, Placebo-controlled, Phase 3 Study of the Efficacy and Safety of Inhaled Treprostinil in Subjects with Idiopathic Pulmonary Fibrosis   Dose and Duration of Treatment:Treprostinil for Inhalation 0.6 mg/mL, or placebo (randomly assigned 1:1) over a 52 week period.  Protocol # RIN-PF-301; IND # [id number]; Clinical Trials.gov Identifier: [id number]  Sponsor: [facility], [place] [address]  Key Features of the Drug: Treprostinil (LRX-15, 15AU81, UT-15) is a stable tricyclic benzindene analogue of the naturally occurring prostacyclin (PGI2; epoprostenol), a member of the eicosanoid family of autocoids; these compounds occur widely in tissues and have important pharmacological properties with potent activity, especially on the cardiovascular system and smooth muscle ([place]). An inhalation solution of treprostinil, 0.6 mg/mL, delivered using an ultrasonic nebulizer, is currently FDA approved to treat pulmonary arterial hypertension (PAH); [WHO Group 1, PH-ILD; WHO Group 3. Inhaled treprostinil is NOW being evaluated as primary treatment of IPF, idiopathic pulmonary fibrosis.   Key Inclusion Criteria: ?67 years of age Diagnosis of IPF based on the 2018 ATS/ERS/JRS/ALAT Clinical Practice Guideline (FVC ?45% predicted at Screening).  Subjects on pirfenidone or nintedanib must be on a stable and optimized dose for ?30 days prior to Baseline. Concomitant use of both pirfenidone and nintedanib is not permitted. Males with a partner of childbearing potential must use a condom for the duration of treatment and for at least 48 hours after discontinuing study drug.   Key Exclusion Criteria: Subject is pregnant or lactating.  FEV1/FVC <0.70 at Screening.  Prior intolerance or significant lack of efficacy to a prostacyclin or prostacyclin analogue that resulted in discontinuation or inability to effectively titrate that therapy.  The subject has  received any PAH-approved therapy, including prostacyclin therapy (epoprostenol, treprostinil, iloprost, or beraprost; except for acute vasoreactivity testing), IP receptor agonists (selexipag), endothelin receptor antagonists, phosphodiesterase type 5 inhibitors (PDE5-Is), or soluble guanylate cyclase stimulators within 60 days prior to Baseline. As needed use of a PDE5-I for erectile dysfunction is permitted, provided no doses are taken within 48 hours of any study-related efficacy assessments.  Use of any of the following medications:  `     a. Azathioprine (AZA), cyclosporine, mycophenolate mofetil, tacroliumus, oral           corticosteroids (OCS) >20 mg/day or the combination of            OCS+AZA+N-acetylcysteine within 30 days prior to Baseline.        b. Cyclophosphamide within 60 days prior to Baseline        c. Rituximab within 6 months prior to Baseline   Receiving >10 L/min of oxygen at rest at Baseline.   Exacerbation of IPF or active pulmonary or upper respiratory infection within 30 days prior to Baseline. Subjects must have completed any antibiotic or steroid regimens for treatment of the infection or acute exacerbation more than 30 days prior to Baseline to be eligible. If hospitalized for an acute exacerbation of IPF or a pulmonary or upper respiratory infection, subjects must have been discharged more than 90 days prior to Baseline to be eligible.  Uncontrolled cardiac disease, defined as myocardial infarction within 6 months prior to Baseline or unstable angina within 30 days prior to Baseline. Use of any investigational drug/device or participation in any investigational study in which the subject received a medical intervention (ie, procedure, device, medication/supplement) within 30 days prior to Screening. Subjects participating in non-interventional, observational, or registry studies are eligible.  Life expectancy <6 months due to IPF or a concomitant illness.  Acute  pulmonary  embolism within 90 days prior to Baseline.   Pharmacodynamics:  (Dose-Response Relationships) - In a clinical study of 241 healthy volunteers, single doses of Tyvaso 54 ?g (the target maintenance dose per session) and 84 ?g (supratherapeutic inhalation dose) prolonged the QTc interval by approximately 10 ms. The QTc effect dissipated rapidly as the concentration of treprostinil decreased.  Pharmacokinetics: (ADME) - Treprostinil is substantially metabolized by the liver, primarily by CYP2C8.  The elimination of treprostinil (following [place] administration of treprostinil) is biphasic, with a terminal elimination t1?2 of approximately 4 hours.    Contraindications:   None  Special Warnings/Considerations: Treprostinil undergoes substantial hepatic metabolism; thus, liver impairment could result in decreased metabolism and increased systemic exposure to treprostinil potentiating the occurrence and/or severity of AEs and/or overdose. There are no adequate and well-controlled studies with Tyvaso in pregnant women. There are risks to the mother and the fetus associated with PAH. Patients who discontinue the use of any effective therapy may be expected to notice a decline in their clinical status. Patients who abruptly discontinue therapy (ie, miss 2 or more doses), or markedly reduce the dose, are at risk for worsening of PAH symptoms or a rebound in [facility]. There is no evidence to suggest or suspect that patients taking inhaled treprostinil would be at risk for withdrawal symptoms or rebound effects.  Treprostinil inhibits platelet aggregation and increases the risk of bleeding, particularly with concomitant anticoagulants and antiplatelet therapies. Treprostinil is a pulmonary and systemic vasodilator. In patients with low systemic arterial pressure, inhaled treprostinil solution may cause symptomatic hypotension, which may present as but is not limited to light-headedness, dizziness, blurred or faded vision,  and syncope. Concomitant administration of treprostinil with diuretics, antihypertensive agents, or other vasodilators that may lower blood pressure increases the risk of symptomatic hypotension. Signs and symptoms of overdose with inhaled treprostinil solution may correspond to dose-limiting pharmacologic effects, including diarrhea, flushing, headache, hypotension, nausea, and vomiting. Most events are self-limiting and resolve with reduction or withholding of inhaled treprostinil solution.  Interactions:              Drug Interaction Studies Conducted with Treprostinil: Study # P01:08: Treprostinil formulation [place] Remodulin vs Acetaminophen, to evaluate effect of acetaminophen on Treprostinil pK, showed no effect. Study # P01:12: Treprostinil formulation [place] Remodulin vs Warfarin, to evaluate effect of Warfarin on Treprostinil pK/PD, showed no effect. Study TDE-PH-105: Treprostinil formulation Orenitram vs Bosentan, to evaluate effect of Bosentan on Treprostinil pK, showed no effect. Study TDE-PH-106: Treprostinil formulation Orenitram vs Sildenafil, to evaluate effect of Sildenafil on Treprostinil pK, showed no effect. Study TDE-PH-109: Treprostinil formulation Orenitram vs Rifampicin (CYP2C8/9 inducer), to evaluate the effect of inducing the DEY8X4 metabolic pathway on the pK of Treprostinil, confirmed metabolic pathway via GYJ8H6/3. Expected interaction resulting in reduced Cmax and AUC. Study TDE-PH-110: Treprostinil formulation Orenitram vs Gemfibrozil (CYP2C8  inhibitor), to evaluate the effect of inhibiting the JSH7W2 metabolic pathway on the pK of Treprostinil, confirmed metabolic pathway primarily via CYP2C8. Expected interaction resulting in significantly increased Cmax and AUC. Study TDE-PH-110: Treprostinil formulation Orenitram vs Fluconazole (CYP2C9 inhibitor), to evaluate the effect of inhibiting the OVZ8H8 metabolic pathway on on the pK of Treprostinil, had no notable effect. Confirmed  CYP2C9 plays a minor role in treprostinil metabolism.  Serious Adverse Reactions Observed in Clinical Studies with Inhaled Treprostinil: Pulmonary Arterial Hypertension: all occurrences from RIN-PH-301 study for Pulmonary Arterial Hypertension. No serious adverse reactions met criteria for inclusion in inhaled treprostinil solution clinical studies for interstitial lung disease.  System/Organ Class: Nervous System disorders - Syncope in 3 of 115 subjects exposed (2.6%)      Serious Adverse Reactions Observed Postmarketing: Gastrointestinal disorders: vomiting, nausea, diarrhea, GI Hemorrhage (reflects an aggregate of the following: GI hemorrhage, rectal hemorrhage, blood in stool [hematochezia or melena], and hematemesis). General disorders and administration site conditions: Pain Infections and infestations: Pneumonia Nervous system disorders: Syncope, dizziness, headache Respiratory, thoracic and mediastinal disorders: Hemoptysis, cough, epistaxis Vascular disorders: Hypotension, hemorrhage (unspecified blood loss ranging from nondescript to heavy or excessive bleeding).  Safety Data:  As of 28 December 2020, approximately 955 subjects have received treprostinil by inhalation across various studies, ranging from acute studies in healthy volunteers to long-term treatment in subjects with PAH and PH-ILD.   The estimate of worldwide exposure to treprostinil in the marketed inhaled formulations is approximately 63,845,364 total patient treatment-days (approximately 34,318 patient treatment-years). The most common adverse effects of inhaled treprostinil ?10% were cough, diarrhea, dizziness, flushing, headache, muscle/jaw/bone pain, nausea, pharyngolaryngeal pain, oropharyngeal pain, syncope, and throat irritation. Other adverse reactions in the observational study comparing subjects taking inhaled treprostinil and a control group were cough, hemoptysis, nasal discomfort, and throat irritation. Angioedema is  a rare but serious reaction that has been seen in the post-marketing setting with treprostinil.  Stability:  Ampoules of drug product are stable until the date indicated when stored in the unopened foil pouch. The [place] commercial labeling states the following regarding storage conditions: Store at [place] to [place] (39F to 33F), with excursions permitted to 68E to 32Z (38F to 40F).  Once the foil pack is opened, ampoules should be used within 7 days. Because Tyvaso is light-sensitive, unopened ampoules should be stored in the foil pouch.  [facility] [place] Coordinator note :   This visit for Subject Erica [name] with DOB: May 15, 1956 on 11/25/2021 for the above protocol is Visit/Encounter # Screening  and is for purpose of research.   The consent for this encounter is under Protocol Version: Amendment 3 approved 05 Oct 2020  IB: version17-  18JUL 2021  ICF: Amendment 1 Version 2.0 approved 29Dec2020 is currently IRB approved.   Subject expressed continued interest and consent in continuing as a study subject. Subject confirmed that there was  no change in contact information (e.g. address, telephone, email). Subject thanked for participation in research and contribution to science.  In this visit 11/25/2021 the subject will be evaluated by  Sub-Investigator named Dr. Linna Darner  . This research coordinator has verified that the above investigator is up to date with his/her training logs.   The Subject was informed that the PI Dr. Chase Caller continues to have oversight of the subject's visits and course  through relevant discussions, reviews and also specifically of this visit by routing of this note to the [facility].  Subject was provided time to read over the consent in a private room. All subject's questions were answered and the subject voluntarily agreed to participate in the study. Consent was obtained. All procedures were completed per the above mentioned protocol. Refer to the subjects  paper source binder for further details of the visit.    Signed by [place] Bing, CMA, BS, [place] Coordinator [place], [place] 12:18 PM 11/25/2021

## 2021-11-26 ENCOUNTER — Encounter: Payer: Medicare HMO | Attending: Physical Medicine & Rehabilitation | Admitting: Physical Medicine & Rehabilitation

## 2021-11-26 ENCOUNTER — Encounter: Payer: Self-pay | Admitting: Physical Medicine & Rehabilitation

## 2021-11-26 VITALS — BP 137/85 | HR 80 | Ht 63.0 in | Wt 178.0 lb

## 2021-11-26 DIAGNOSIS — R269 Unspecified abnormalities of gait and mobility: Secondary | ICD-10-CM | POA: Diagnosis not present

## 2021-11-26 DIAGNOSIS — I69398 Other sequelae of cerebral infarction: Secondary | ICD-10-CM

## 2021-11-26 NOTE — Progress Notes (Signed)
Subjective:    Patient ID: Erica [name], female    DOB: 12/28/1955, 67 y.o.   MRN: [id number] 66 y.o. 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  Per chart review lives with her children.  Independent with assistive device.  1 level home with level entry.  Good supportive family.  Presented 11/03/2020 with persistent headache and left-sided weakness x3 days.  MRI showed punctate acute infarction at the medial right parieto-occipital junction.  Chronic small vessel ischemic changes.  MRA of the head negative for large vessel occlusion.  MRI cervical spine with persistent patchy signal abnormality involving the central dorsal aspect of the cervical spinal cord extending from C2-C7-T1 relatively stable and unchanged from previous tracings.  Admission chemistries unremarkable except chloride 113 creatinine 1.70 hemoglobin 9.2 hemoglobin A1c 6.3.  Echocardiogram with ejection fraction of 60 to 56% grade 1 diastolic dysfunction.  Maintained on aspirin and Plavix for CVA prophylaxis x3 weeks and aspirin alone.     HPI  Left shoulder pain somewhat better since injection by Ortho Dr Tonita Cong in Jan or Feb this year s Patient ambulating with a cane as well as AFO.  She complains of persistent coldness feeling in the left arm.  She remains independent with her self-care and mobility. The patient is able to climb steps.  She does not drive.  She needs help with shopping and household duties but otherwise performs her ADLs independently.  She is requesting handicap placard Pain Inventory Average Pain 6 Pain Right Now 6 My pain is aching  LOCATION OF PAIN  head  BOWEL Number of stools per week: 7 Oral laxative use No  Type of laxative . Enema or suppository use No  History of colostomy No   Incontinent No   BLADDER Normal In and out cath, frequency . Able to self cath  . Bladder incontinence No  Frequent urination No  Leakage with coughing No  Difficulty starting stream No  Incomplete bladder emptying No    Mobility walk with assistance use a cane ability to climb steps?  yes do you drive?  no  Function disabled: date disabled . I need assistance with the following:  household duties and shopping  Neuro/Psych numbness trouble walking dizziness  Prior Studies CT/MRI  Physicians involved in your care ED not admitted   Family History  Problem Relation Age of Onset   Hypertension Father    Stroke Father    Diabetes Father    Heart disease Mother    Hypertension Mother    Hyperlipidemia Mother    Hypertension Sister    Hypertension Brother    Hypertension Sister    Hypertension Brother    Cancer Other 73       colon cancer    Cancer Maternal Aunt        cancer, unknown type    Cancer Maternal Uncle        bone cancer    Cancer Paternal Aunt        lung cancer   Cancer Paternal Uncle        lung cancer   Cancer Maternal Uncle        colon cancer and prostate cancer    Cancer Maternal Uncle        prostate cancer    Social History   Socioeconomic History  Marital status: Single    Spouse name: Not on file   Number of children: 1   Years of education: Not on file   Highest education level: Not on file  Occupational History   Not on file  Tobacco Use   Smoking status: Former    Packs/day: 0.50    Years: 20.00    Total pack years: 10.00    Types: Cigarettes    Quit date: 01/05/2018    Years since quitting: 3.8   Smokeless tobacco: Never   Tobacco comments:    Unknown   Vaping Use   Vaping Use: Never used  Substance and Sexual Activity   Alcohol use: Not Currently   Drug use: Not Currently    Types: "Crack" cocaine    Comment: Unknown    Sexual activity: Never    Birth control/protection: None  Other Topics Concern    Not on file  Social History Narrative   12/30/20 lives with others   Social Determinants of Health   Financial Resource Strain: Not on file  Food Insecurity: Not on file  Transportation Needs: Not on file  Physical Activity: Not on file  Stress: Not on file  Social Connections: Not on file   Past Surgical History:  Procedure Laterality Date   ANAL RECTAL MANOMETRY N/A 07/13/2016   Procedure: [place];  Surgeon: Leighton Ruff, MD;  Location: [facility];  Service: Endoscopy;  Laterality: N/A;   CESAREAN SECTION     EUS N/A 11/21/2012   Procedure: LOWER ENDOSCOPIC ULTRASOUND (EUS);  Surgeon: Arta Silence, MD;  Location: [facility];  Service: Endoscopy;  Laterality: N/A;   FLEXIBLE SIGMOIDOSCOPY N/A 11/21/2012   Procedure: FLEXIBLE SIGMOIDOSCOPY;  Surgeon: Arta Silence, MD;  Location: [facility];  Service: Endoscopy;  Laterality: N/A;   FLEXIBLE SIGMOIDOSCOPY N/A 01/28/2013   Procedure: FLEXIBLE SIGMOIDOSCOPY;  Surgeon: Leighton Ruff, MD;  Location: [facility];  Service: Endoscopy;  Laterality: N/A;   LAPAROSCOPIC LOW ANTERIOR RESECTION N/A 01/29/2013   Procedure: LAPAROSCOPIC LOW ANTERIOR RESECTION, Rigid Proctoscopy;  Surgeon: Leighton Ruff, MD;  Location: [facility];  Service: General;  Laterality: N/A;   LAPAROSCOPIC SIGMOID COLECTOMY N/A 11/14/2012   Procedure: DIAGNOSTIC LAPAROSCOPY AND SIGMOIDMOIDOSCOPY ;  Surgeon: Rolm Bookbinder, MD;  Location: [facility];  Service: General;  Laterality: N/A;   RECTAL ULTRASOUND N/A 07/13/2016   Procedure: RECTAL ULTRASOUND;  Surgeon: Leighton Ruff, MD;  Location: [facility];  Service: Endoscopy;  Laterality: N/A;   TONSILLECTOMY     TONSILLECTOMY AND ADENOIDECTOMY     Past Medical History:  Diagnosis Date   Arthritis    especially in shoulders   Asthma    Depression    Diabetes mellitus    Embolic stroke ([place]) 07/17/5595   GERD (gastroesophageal reflux disease)    Headache(784.0)    "mild"   Hypertension    Loop Biotronik  loop implant 12/16/2020 12/16/2020   Mental disorder    depression   Neuropathy    feet    Pain    arthritis pain - takes tramadol as needed   Rectal polyp    very little bleeding with bowel movements- no pain   Stroke ([place]) 11/03/2020   Suicide attempt (HCC)    BP 137/85   Pulse 80   Ht _0  (1.6 m)   Wt 178 lb (80.7 kg)   SpO2 94%   BMI 31.53 kg/m   Opioid Risk Score:   Fall Risk Score:  `1  Depression screen [facility] 2/9  11/26/2021    2:15 PM 04/20/2021    2:17 PM 12/11/2020    3:28 PM 12/03/2020    9:41 AM 07/29/2020    8:39 AM  Depression screen PHQ 2/9  Decreased Interest _0 0 3  Down, Depressed, Hopeless _1 PHQ - 2 Score _2 Altered sleeping    1 3  Tired, decreased energy    3 3  Change in appetite    2 3  Feeling bad or failure about yourself     0 3  Trouble concentrating    3 3  Moving slowly or fidgety/restless    1 3  Suicidal thoughts    0 1  PHQ-9 Score    11 25  Difficult doing work/chores    Very difficult       Review of Systems  Musculoskeletal:  Positive for gait problem. Negative for back pain.  Neurological:  Positive for dizziness, numbness and headaches.  All other systems reviewed and are negative.      Objective:   Physical Exam Constitutional:      Appearance: She is obese.  HENT:     Head: Normocephalic and atraumatic.  Eyes:     Extraocular Movements: Extraocular movements intact.     Conjunctiva/sclera: Conjunctivae normal.     Pupils: Pupils are equal, round, and reactive to light.  Musculoskeletal:     Comments: No pain with shoulder range of motion.  Negative impingement signs No left upper extremity swelling  Skin:    General: Skin is warm and dry.  Neurological:     Mental Status: She is alert and oriented to person, place, and time.     Comments: Motor strength is 4/5 in left deltoid bicep tricep grip as well as hip flexor knee extensor ankle dorsiflexor 5/5 on the right side.  Psychiatric:         Mood and Affect: Mood normal.        Behavior: Behavior normal.           Assessment & Plan:  1.  History of right thalamic and caudate infarcts she has persistent mild left hemiparesis at but mainly left hemisensory deficits primarily affecting left upper extremity. Given history of multiple strokes would recommend neurology follow-up for secondary stroke prevention We will follow-up physical medicine rehab in 1 year.  We discussed vivistim, she is potentially participating in a 1 year study for her interstitial lung disease.  She will consider vagal nerve stimulation in the future

## 2021-11-29 DIAGNOSIS — L259 Unspecified contact dermatitis, unspecified cause: Secondary | ICD-10-CM | POA: Diagnosis not present

## 2021-11-29 DIAGNOSIS — I6789 Other cerebrovascular disease: Secondary | ICD-10-CM | POA: Diagnosis not present

## 2021-11-29 DIAGNOSIS — J4520 Mild intermittent asthma, uncomplicated: Secondary | ICD-10-CM | POA: Diagnosis not present

## 2021-11-29 DIAGNOSIS — N183 Chronic kidney disease, stage 3 unspecified: Secondary | ICD-10-CM | POA: Diagnosis not present

## 2021-11-29 DIAGNOSIS — E1321 Other specified diabetes mellitus with diabetic nephropathy: Secondary | ICD-10-CM | POA: Diagnosis not present

## 2021-11-29 DIAGNOSIS — M129 Arthropathy, unspecified: Secondary | ICD-10-CM | POA: Diagnosis not present

## 2021-11-29 DIAGNOSIS — I10 Essential (primary) hypertension: Secondary | ICD-10-CM | POA: Diagnosis not present

## 2021-12-02 ENCOUNTER — Ambulatory Visit (INDEPENDENT_AMBULATORY_CARE_PROVIDER_SITE_OTHER): Payer: Medicare HMO | Admitting: Adult Health

## 2021-12-02 ENCOUNTER — Encounter: Payer: Self-pay | Admitting: Adult Health

## 2021-12-02 VITALS — BP 145/85 | HR 85 | Ht 63.0 in | Wt 178.0 lb

## 2021-12-02 DIAGNOSIS — I639 Cerebral infarction, unspecified: Secondary | ICD-10-CM | POA: Diagnosis not present

## 2021-12-02 DIAGNOSIS — R6889 Other general symptoms and signs: Secondary | ICD-10-CM | POA: Diagnosis not present

## 2021-12-02 DIAGNOSIS — G3184 Mild cognitive impairment, so stated: Secondary | ICD-10-CM | POA: Diagnosis not present

## 2021-12-02 NOTE — Progress Notes (Signed)
[facility] [address] [place]. [place] [address] [phone]       [place]  [name] Date of Birth:  23-Aug-1955 Medical Record Number:  [id number]   Reason for Referral:  hospital stroke follow up    SUBJECTIVE:   CHIEF COMPLAINT:  Chief Complaint  Patient presents with   Follow-up    Rm 3 here for 6 month f/u. Reports she has been doing well. She has noticed some decline in short term memory since her last f/u.      HPI:   Update 12/02/2021 JM: Patient returns for stroke follow-up after prior visit 7 months ago.  She did present to ED on 6/12 with complaints of worsening left-sided facial numbness and gradual left leg weakness throughout the day as well as walking difficulty on 6/10, due to persisting she proceeded to ED.  Completed MRI brain which was negative for acute stroke.  Symptoms gradually improved prior to discharge. Lab work markable except elevated creatinine level advise follow-up with cardiology to discuss Entresto.  She has been doing since then. Does confirm she returned back to baseline functioning prior to leaving.  Feels as though left-sided weakness has improved since prior visit.  Ambulating with cane, denies any recent falls.  Does still have mild left hand numbness and left lip tingling.  She is concerned regarding her short-term memory which has been poor since her stroke. She will forget recent conversations or walk in to a room and forget why she went in to the room. No issues with long term.  Does do word searches at home. Does admit to some underlying anxiety and difficulty concentrating.  Is currently on sertraline and venlafaxine managed by PCP.  Sleep is poor with longstanding insomnia, has sleep study in 2018 which was negative for sleep apnea (per pt report, unable to view results via epic). Reports her mother had Alzheimer's disease although never formally diagnosed and did have a large stroke in her 20's  which was around the time she started to have memory loss. She is fearful that she may be developing Alzheimer's dementia and would like to see a psychologist for such.   Reports compliance on aspirin and Crestor, denies side effects.  Blood pressure today 145/85.  Reports recent A1c 5.2.  Routinely followed by PCP.   No further concerns at this time     History provided for reference purposes only Update 04/29/2021 JM: Returns for 64-monthstroke follow-up unaccompanied  Continues to work with therapies making gradual improving of left sided weakness. Continues to use RW and at times her cane. Denies any recent falls.  Denies new stroke/TIA symptoms. Her son continues to live with her but able to maintain ADLs and majority of IADLs independently.   Compliant on aspirin and crestor - denies side effects Blood pressure today slightly increased - reports increased stressors today but monitors at home and typically stable Glucose levels stable at home -has not had recent A1c check Recent direct LDL 37  Plans to contact PCP to schedule follow-up visit Routinely followed by pulmonology for pulmonary fibrosis  No new concerns at this time  Update 12/30/2020 JM: [name] for hospital follow-up regarding recent stroke on 11/03/2020.  She presented with 2-3 day hx of headaches and worsening left-sided weakness.  Personally reviewed hospitalization pertinent progress notes, lab work and imaging summary provided.  Evaluated by Dr. SLeonie Manwith stroke work-up revealing right parietal occipital stroke likely due to small  vessel disease although unable to rule out cardioembolic source given recent right carotid head and right anterior lateral thalamic infarcts.  MRA negative LVO.  Carotid Dopplers unremarkable.  Normal EF.  Loop recorder recommended but declined by patient inpatient.  Prior 14-day cardiac monitor completed which was negative for A. fib.  On Plavix PTA and recommended DAPT for 3 weeks  and aspirin alone.  HTN stable.  LDL 56 on Crestor 20 mg daily.  Controlled DM with A1c 5.2.  Other stroke risk factors include recent stroke (see below), advanced age, former tobacco use and CAD.  Discharged to CIR on 11/06/2020 -11/21/2020.  Since discharge, she has been making gradual improvements.  Currently working with home health PT for residual left-sided weakness and left sided numbness. She has since completed SLP. She has also been having headaches since her stroke - has been taking tramadol with benefit which was started by PMR.  Continues to ambulate with RW at all times -denies any recent falls. Her son is currently living with her. Able to maintain ADLs independently. Completed 3 weeks DAPT and remains on aspirin alone as well as Crestor without associated side effects.  Blood pressure today 128/79.  Had loop recorder placed by Dr. Terri Skains 7/6.   Sister jointed visit towards the end of visit - she is concerned regarding worsening depression and living situation as son does not assist with patients care.  She has not had recent visit with PCP.  She is currently on trazodone, venlafaxine, and buspirone  Initial visit 08/19/2020 JM: [name] is being seen for hospital follow-up accompanied by her son.  She was discharged home from [place] on 07/15/2020.  Reports residual left sided weakness with numbness/tingling and left lower facial numbness Working with [facility] PT/OT 2x/ week -reports continued improvement since discharge Currently ambulating with rolling walker but she was not using previously -denies any recent falls Lives with her son who will assist as needed but she has been slowly becoming more independent with ADLs Denies new stroke/TIA symptoms  Completed 3 weeks DAPT and remains on Plavix alone -denies side effects Reports compliance on atorvastatin 40 mg daily -denies side effects Blood pressure today satisfactory 127/78 -monitors at home and typically stable Glucose levels routinely  monitored at home which have been fluctuating per patient report  She has not had follow-up with PCP or cardiology since discharge  No further concerns at this time  Stroke admission 06/25/2020: Ms. HLEE FRINGER is a 66 y.o. female with history of hypertension, hypercholesteremia, diabetes complicated by neuropathy, CAD, depression C/V prior suicide attempt, arthritis, 10-pack-year history of smoking quit in 2020, COPD/asthma, polysubstance abuse who presented to [facility] ED on 06/25/2020 with slurred speech on and off for past week and left leg weakness.    Personally reviewed hospitalization pertinent progress notes, lab work and imaging with summary provided.  Evaluated by Dr. Erlinda Hong with stroke work-up revealing right caudate head and right anterior lateral thalamic infarcts, likely secondary to small vessel disease however given 2 separate locations unable to rule out embolic source with 42-HCW cardiac event monitor outpatient.  Recommended DAPT for 3 weeks and Plavix alone.  LDL 86 -increase atorvastatin from 20 mg to 40 mg daily and ongoing use of fenofibrate and Vascepa.  Controlled DM with A1c 5.2.  Other stroke risk factors include advanced age, obesity, CAD, hx of cocaine use and former tobacco use.  Residual deficits of left hemiparesis and left hemisensory impairment.  Evaluated by therapies and discharged to CIR  for ongoing therapy needs  Stroke: Lacunar infarcts in right caudate head and right anterior lateral thalamus, likely due to small vessel disease. However, given two separate location will need to rule out emoblic source CT head: Lacunar infarcts in the right caudate and right cerebellum, new from 2012, but age indeterminate. MRI : 1 cm acute infarction in the anterolateral thalamus on the right, possibly with some involvement of the posterior limb internal capsule. Acute infarction within the right caudate head.  MRA : Both internal carotid arteries widely patent through the skull base and  siphon regions. No stenosis.  Carotid Doppler: Velocities in the left and right ICA are consistent with a 1-39%  stenosis.  2D Echo: Left ventricular ejection fraction, by estimation, is 55 to 60%.  30 day cardiac event monitoring as outpt to rule out afib LDL 86 HgbA1c 5.2 RPR/HIV neg VTE prophylaxis -Lovenox 30 mg aspirin 81 mg daily prior to admission, now on aspirin 81 mg daily and Plavix DAPT for 3 weeks, then Plavix 75 mg alone.  Therapy recommendations: CIR Disposition: d/c to CIR on 06/30/2020       ROS:   14 system review of systems performed and negative with exception of those listed in HPI  PMH:  Past Medical History:  Diagnosis Date   Arthritis    especially in shoulders   Asthma    Depression    Diabetes mellitus    Embolic stroke ([place]) 7/86/7672   GERD (gastroesophageal reflux disease)    Headache(784.0)    "mild"   Hypertension    Loop Biotronik loop implant 12/16/2020 12/16/2020   Mental disorder    depression   Neuropathy    feet    Pain    arthritis pain - takes tramadol as needed   Rectal polyp    very little bleeding with bowel movements- no pain   Stroke ([place]) 11/03/2020   Suicide attempt ([place])     PSH:  Past Surgical History:  Procedure Laterality Date   ANAL RECTAL MANOMETRY N/A 07/13/2016   Procedure: ANO RECTAL MANOMETRY;  Surgeon: Leighton Ruff, MD;  Location: [facility];  Service: Endoscopy;  Laterality: N/A;   CESAREAN SECTION     EUS N/A 11/21/2012   Procedure: LOWER ENDOSCOPIC ULTRASOUND (EUS);  Surgeon: Arta Silence, MD;  Location: [facility];  Service: Endoscopy;  Laterality: N/A;   FLEXIBLE SIGMOIDOSCOPY N/A 11/21/2012   Procedure: FLEXIBLE SIGMOIDOSCOPY;  Surgeon: Arta Silence, MD;  Location: [facility];  Service: Endoscopy;  Laterality: N/A;   FLEXIBLE SIGMOIDOSCOPY N/A 01/28/2013   Procedure: FLEXIBLE SIGMOIDOSCOPY;  Surgeon: Leighton Ruff, MD;  Location: [facility];  Service: Endoscopy;  Laterality: N/A;    LAPAROSCOPIC LOW ANTERIOR RESECTION N/A 01/29/2013   Procedure: LAPAROSCOPIC LOW ANTERIOR RESECTION, Rigid Proctoscopy;  Surgeon: Leighton Ruff, MD;  Location: [facility];  Service: General;  Laterality: N/A;   LAPAROSCOPIC SIGMOID COLECTOMY N/A 11/14/2012   Procedure: DIAGNOSTIC LAPAROSCOPY AND SIGMOIDMOIDOSCOPY ;  Surgeon: Rolm Bookbinder, MD;  Location: [facility];  Service: General;  Laterality: N/A;   RECTAL ULTRASOUND N/A 07/13/2016   Procedure: RECTAL ULTRASOUND;  Surgeon: Leighton Ruff, MD;  Location: [facility];  Service: Endoscopy;  Laterality: N/A;   TONSILLECTOMY     TONSILLECTOMY AND ADENOIDECTOMY      Social History:  Social History   Socioeconomic History   Marital status: Single    Spouse name: Not on file   Number of children: 1   Years of education: Not on file   Highest education level:  Not on file  Occupational History   Not on file  Tobacco Use   Smoking status: Former    Packs/day: 0.50    Years: 20.00    Total pack years: 10.00    Types: Cigarettes    Quit date: 01/05/2018    Years since quitting: 3.9   Smokeless tobacco: Never   Tobacco comments:    Unknown   Vaping Use   Vaping Use: Never used  Substance and Sexual Activity   Alcohol use: Not Currently   Drug use: Not Currently    Types: "Crack" cocaine    Comment: Unknown    Sexual activity: Never    Birth control/protection: None  Other Topics Concern   Not on file  Social History Narrative   12/30/20 lives with others   Social Determinants of Health   Financial Resource Strain: Not on file  Food Insecurity: Not on file  Transportation Needs: Not on file  Physical Activity: Not on file  Stress: Not on file  Social Connections: Not on file  Intimate Partner Violence: Not on file    Family History:  Family History  Problem Relation Age of Onset   Hypertension Father    Stroke Father    Diabetes Father    Heart disease Mother    Hypertension Mother    Hyperlipidemia Mother     Hypertension Sister    Hypertension Brother    Hypertension Sister    Hypertension Brother    Cancer Other 70       colon cancer    Cancer Maternal Aunt        cancer, unknown type    Cancer Maternal Uncle        bone cancer    Cancer Paternal Aunt        lung cancer   Cancer Paternal Uncle        lung cancer   Cancer Maternal Uncle        colon cancer and prostate cancer    Cancer Maternal Uncle        prostate cancer     Medications:   Current Outpatient Medications on File Prior to Visit  Medication Sig Dispense Refill   acetaminophen (TYLENOL) 325 MG tablet Take 2 tablets (650 mg total) by mouth every 4 (four) hours as needed for mild pain (or temp > 37.5 C (99.5 F)).     albuterol (VENTOLIN HFA) 108 (90 Base) MCG/ACT inhaler Inhale 2 puffs into the lungs every 4 (four) hours as needed for wheezing. 1 g 5   amLODipine (NORVASC) 10 MG tablet Take 1 tablet (10 mg total) by mouth daily. 30 tablet 2   aspirin EC 81 MG EC tablet Take 1 tablet (81 mg total) by mouth daily. Swallow whole. 30 tablet 11   ENTRESTO 49-51 MG TAKE 1 TABLET BY MOUTH TWICE DAILY (Patient taking differently: Take 1 tablet by mouth 2 (two) times daily.) 60 tablet 0   famotidine (PEPCID) 20 MG tablet Take 1 tablet (20 mg total) by mouth daily. 30 tablet 0   fenofibrate (TRICOR) 145 MG tablet TAKE 1 TABLET(145 MG) BY MOUTH DAILY (Patient taking differently: Take 145 mg by mouth daily.) 90 tablet 3   gabapentin (NEURONTIN) 100 MG capsule Take 2 capsules (200 mg total) by mouth 2 (two) times daily as needed (pain). (Patient taking differently: Take 100 mg by mouth 2 (two) times daily.) 60 capsule 0   hydrALAZINE (APRESOLINE) 25 MG tablet Take 25 mg by mouth in  the morning and at bedtime.     insulin glargine (LANTUS SOLOSTAR) 100 UNIT/ML Solostar Pen Please provide 24 units every a.m. and 20 units every p.m. (Patient taking differently: Inject 18-24 Units into the skin See admin instructions. 24 units in the  morning 18 units at bedtime) 15 mL 11   Insulin Pen Needle (PEN NEEDLES) 32G X 6 MM MISC 1 application by Does not apply route 2 (two) times daily. 100 each 1   Multiple Vitamin (MULTIVITAMIN ADULT PO) Take 1 tablet by mouth daily.     pantoprazole (PROTONIX) 40 MG tablet Take 40 mg by mouth daily.     Pirfenidone (ESBRIET) 801 MG TABS Take 801 mg by mouth with breakfast, with lunch, and with evening meal. (Patient taking differently: Take 801 mg by mouth in the morning and at bedtime.) 270 tablet 1   rosuvastatin (CRESTOR) 20 MG tablet TAKE 1 TABLET(20 MG) BY MOUTH DAILY (Patient taking differently: Take 20 mg by mouth daily.) 90 tablet 0   sertraline (ZOLOFT) 50 MG tablet Take 50 mg by mouth daily.     SYMBICORT 160-4.5 MCG/ACT inhaler INHALE 2 PUFFS INTO THE LUNGS TWICE DAILY 10.2 g 5   Tiotropium Bromide Monohydrate (SPIRIVA RESPIMAT) 1.25 MCG/ACT AERS Inhale 2 puffs into the lungs daily. 4 g 5   venlafaxine XR (EFFEXOR-XR) 150 MG 24 hr capsule Take 150 mg by mouth daily.     No current facility-administered medications on file prior to visit.    Allergies:   Allergies  Allergen Reactions   Penicillins Hives, Itching and Swelling    Tongue swells up Has patient had a PCN reaction causing immediate rash, facial/tongue/throat swelling, SOB or lightheadedness with hypotension: Yes Has patient had a PCN reaction causing severe rash involving mucus membranes or skin necrosis: Yes Has patient had a PCN reaction that required hospitalization Yes Has patient had a PCN reaction occurring within the last 10 years: No If all of the above answers are "NO", then may proceed with Cephalosporin use.       OBJECTIVE:  Physical Exam  Vitals:   12/02/21 1414  BP: (!) 145/85  Pulse: 85  Weight: 178 lb (80.7 kg)  Height: _0  (1.6 m)   Body mass index is 31.53 kg/m. No results found.   General: well developed, well nourished,  very pleasant elderly African-American female, seated, in  no evident distress Head: head normocephalic and atraumatic.   Neck: supple with no carotid or supraclavicular bruits Cardiovascular: regular rate and rhythm, no murmurs Musculoskeletal: no deformity Skin:  no rash/petichiae Vascular:  Normal pulses all extremities   Neurologic Exam Mental Status: Awake and fully alert.  Fluent speech and language.  Oriented to place and time. Recent memory impaired and remote memory intact. Attention span, concentration and fund of knowledge appropriate during visit. Mood and affect appropriate. Recall 0/3 (instantly said "I dont remember" as soon as I asked her to recall 3 words), with prompt 2/3. Serial addition impaired. 4 legged animal naming 10 in 60 seconds. Clock drawing 4/4.  Cranial Nerves: Pupils equal, briskly reactive to light. Extraocular movements full without nystagmus. Visual fields full to confrontation. Hearing intact.  Decreased light touch left lower facial sensation.  Mild left lower facial weakness.  Tongue and palate moves normally and symmetrically.  Motor: Normal bulk and tone and normal strength on the right side.  Mild left hand and ankle dorsiflexion weakness Sensory.:  Intact to light touch, vibratory and pinprick sensation throughout Coordination: Rapid alternating  movements normal in all extremities except slightly decreased left hand. Finger-to-nose and heel-to-shin performed accurately bilaterally.   Gait and Station: Arises from chair without difficulty. Stance is normal. Gait demonstrates slightly decreased step height of LLE with use of foot drop brace and cane, mild unsteadiness initially.  Unable to complete tandem walk or heel toe.   Reflexes: 1+ and symmetric. Toes downgoing.         ASSESSMENT: AZELYN BATIE is a 66 y.o. year old female with recurrent right MCA strokes on 11/03/2020 and 06/25/2020 likely in setting of small vessel disease although unable to rule out cardioembolic source with residual left hemiparesis and  sensory impairment.  Loop recorder placed 12/2020 by Dr. Terri Skains [facility] cardiology) to evaluate for possible A. fib as prior cardiac monitor negative.  Vascular risk factors include HTN, HLD, DM, CAD, history of tobacco use and history of polysubstance abuse.  Likely episode of recrudescence of prior stroke symptoms 6/12 with work-up unremarkable and quickly returned to baseline.     PLAN:  Ischemic stroke:  Residual deficit: Mild left hemiparesis.  Continue working with PT/OT for hopeful ongoing recovery Continue aspirin 55m daily and Crestor and fenofibrate for secondary stroke prevention.  S/p ILR 12[facility]16/2020 [facility] TTerri Skainsdiscussed secondary stroke prevention measures and importance of close PCP follow up for aggressive stroke risk factor management including BP goal<130/90, HLD with LDL goal<70 and DM with A1c.<7  Memory loss: Likely multifactorial mild cognitive impairment in setting of prior strokes, anxiety, poor sleep habits and underlying comorbidities.  Prior lab work for reversible causes back in February unremarkable.  Recent MRI brain 6/12 no acute findings.  Highly encouraged routine memory exercises as well as increasing daily physical activity, ensuring healthy diet and adequate sleep.  Also discussed importance of anxiety and stress management.  Referral placed for neurocognitive evaluation as patient strongly requested this to be completed     Doing well from stroke standpoint without further recommendations and risk factors are managed by PCP. She may follow up PRN, as usual for our patients who are strictly being followed for stroke. If any new neurological issues should arise, request PCP place referral for evaluation by one of our neurologists. Thank you.      CC:  PCP: ANolene Ebbs MD    I spent 36 minutes of face-to-face and non-face-to-face time with patient.  This included previsit chart review, lab review, study review, order entry,  electronic health record documentation, patient education and discussion regarding recurrent strokes including etiology, secondary stroke prevention measures and importance of managing stroke risk factors, residual deficits and memory concerns and answered all other questions to patient satisfaction  JFrann Rider AGNP-BC  [facility] [address]SH[address] HeightsG[place] [place] [id number] Phone [phone] [phone]: This document was prepared with digital dictation and possible smart phrase technology. Any transcriptional errors that result from this process are unintentional.

## 2021-12-02 NOTE — Patient Instructions (Signed)
Suspect your memory loss is more in setting of prior stroke and likely underlying anxiety but we will have you do a neurocognitive evaluation to look other causes   Continue aspirin 81 mg daily  and Crestor  for secondary stroke prevention  Continue to follow up with PCP regarding cholesterol, diabetes and blood pressure management  Maintain strict control of hypertension with blood pressure goal below 130/90, diabetes with hemoglobin A1c goal below 7.0 % and cholesterol with LDL cholesterol (bad cholesterol) goal below 70 mg/dL.   Signs of a Stroke? Follow the BEFAST method:  Balance Watch for a sudden loss of balance, trouble with coordination or vertigo Eyes Is there a sudden loss of vision in one or both eyes? Or double vision?  Face: Ask the person to smile. Does one side of the face droop or is it numb?  Arms: Ask the person to raise both arms. Does one arm drift downward? Is there weakness or numbness of a leg? Speech: Ask the person to repeat a simple phrase. Does the speech sound slurred/strange? Is the person confused ? Time: If you observe any of these signs, call 911.       Thank you for coming to see [place] at [facility]. I hope we have been able to provide you high quality care today.  You may receive a patient satisfaction survey over the next few weeks. We would appreciate your feedback and comments so that we may continue to improve ourselves and the health of our patients.

## 2021-12-03 DIAGNOSIS — E119 Type 2 diabetes mellitus without complications: Secondary | ICD-10-CM | POA: Diagnosis not present

## 2021-12-03 DIAGNOSIS — N3946 Mixed incontinence: Secondary | ICD-10-CM | POA: Diagnosis not present

## 2021-12-06 ENCOUNTER — Telehealth: Payer: Self-pay | Admitting: Adult Health

## 2021-12-06 DIAGNOSIS — F331 Major depressive disorder, recurrent, moderate: Secondary | ICD-10-CM | POA: Diagnosis not present

## 2021-12-06 NOTE — Telephone Encounter (Signed)
Referral for Neuropsychology sent to [facility] [phone].

## 2021-12-08 ENCOUNTER — Other Ambulatory Visit: Payer: Self-pay | Admitting: Internal Medicine

## 2021-12-08 DIAGNOSIS — Z1231 Encounter for screening mammogram for malignant neoplasm of breast: Secondary | ICD-10-CM

## 2021-12-10 ENCOUNTER — Ambulatory Visit: Payer: Medicare HMO

## 2021-12-13 DIAGNOSIS — E1140 Type 2 diabetes mellitus with diabetic neuropathy, unspecified: Secondary | ICD-10-CM | POA: Diagnosis not present

## 2021-12-15 ENCOUNTER — Ambulatory Visit
Admission: RE | Admit: 2021-12-15 | Discharge: 2021-12-15 | Disposition: A | Discharge status: Home / Self Care | Payer: Medicare HMO | Source: Ambulatory Visit | Attending: Internal Medicine | Admitting: Internal Medicine

## 2021-12-15 DIAGNOSIS — Z1231 Encounter for screening mammogram for malignant neoplasm of breast: Secondary | ICD-10-CM | POA: Diagnosis not present

## 2021-12-17 DIAGNOSIS — I10 Essential (primary) hypertension: Secondary | ICD-10-CM | POA: Diagnosis not present

## 2021-12-22 DIAGNOSIS — Z4509 Encounter for adjustment and management of other cardiac device: Secondary | ICD-10-CM | POA: Diagnosis not present

## 2021-12-22 DIAGNOSIS — Z95818 Presence of other cardiac implants and grafts: Secondary | ICD-10-CM | POA: Diagnosis not present

## 2021-12-22 DIAGNOSIS — I639 Cerebral infarction, unspecified: Secondary | ICD-10-CM | POA: Diagnosis not present

## 2021-12-23 DIAGNOSIS — R6889 Other general symptoms and signs: Secondary | ICD-10-CM | POA: Diagnosis not present

## 2021-12-30 ENCOUNTER — Telehealth: Payer: Self-pay | Admitting: Internal Medicine

## 2021-12-30 NOTE — Telephone Encounter (Signed)
  Title:A Randomized, Double-blind, Placebo-controlled, Phase 3 Study of the Efficacy and Safety of Inhaled Treprostinil in Subjects with Idiopathic Pulmonary Fibrosis   Dose and Duration of Treatment:Treprostinil for Inhalation 0.6 mg/mL, or placebo (randomly assigned 1:1) over a 52 week period.  Protocol # RIN-PF-301; IND # [id number]; Clinical Trials.gov Identifier: [id number]  Sponsor: [place], [place] [address]   Xxxx  PI note: chart review indicated "mild memory deficits" noted by Frann Rider NP neurology.  However in the interactions of this principal investigator as treating physician and also in discussions with the research coordinators -individually and collectively we have felt that patient did have adequate capacity to understand study requirements and follow through with instructions both from a cognitive standpoint and a physical standpoint.    I therefore called neurology nurse practitioner Frann Rider -I explained to her the above study and the general requirements.  Discussed the concern for mild memory deficits.  Nurse practitioner felt that patient is indeed capable and does have the capacity to execute the above study from cognitive and physical standpoint and did not feel memory issues was significant enough to preclude study participation at this current point.  Have communicated this with the study team     SIGNATURE    Dr. Brand Males, M.D., F.C.C.P, ACRP-CPI Pulmonary and [place], [facility] [place] Staff Physician, [place] Director - Interstitial Lung Disease  Program  Pulmonary [place] [facility] @ [place], [place], [address]   Pager: [phone], If no answer  OR between  19:00-7:00h: page [phone] Telephone (research): [phone]  2:16 PM 12/30/2021   2:16 PM 12/30/2021

## 2021-12-31 ENCOUNTER — Encounter: Payer: Medicare HMO | Admitting: *Deleted

## 2021-12-31 DIAGNOSIS — J84112 Idiopathic pulmonary fibrosis: Secondary | ICD-10-CM

## 2021-12-31 DIAGNOSIS — R6889 Other general symptoms and signs: Secondary | ICD-10-CM | POA: Diagnosis not present

## 2021-12-31 DIAGNOSIS — Z006 Encounter for examination for normal comparison and control in clinical research program: Secondary | ICD-10-CM

## 2021-12-31 NOTE — Research (Signed)
Title:A Randomized, Double-blind, Placebo-controlled, Phase 3 Study of the Efficacy and Safety of Inhaled Treprostinil in Subjects with Idiopathic Pulmonary Fibrosis   Dose and Duration of Treatment:Treprostinil for Inhalation 0.6 mg/mL, or placebo (randomly assigned 1:1) over a 52 week period.  Protocol # RIN-PF-301; IND # [id number]; Clinical Trials.gov Identifier: [id number]  Sponsor: [facility], [place] [address]  Key Features of the Drug: Treprostinil (LRX-15, 15AU81, UT-15) is a stable tricyclic benzindene analogue of the naturally occurring prostacyclin (PGI2; epoprostenol), a member of the eicosanoid family of autocoids; these compounds occur widely in tissues and have important pharmacological properties with potent activity, especially on the cardiovascular system and smooth muscle ([place]). An inhalation solution of treprostinil, 0.6 mg/mL, delivered using an ultrasonic nebulizer, is currently FDA approved to treat pulmonary arterial hypertension (PAH); [WHO Group 1, PH-ILD; WHO Group 3. Inhaled treprostinil is NOW being evaluated as primary treatment of IPF, idiopathic pulmonary fibrosis.   Key Inclusion Criteria: ?67 years of age Diagnosis of IPF based on the 2018 ATS/ERS/JRS/ALAT Clinical Practice Guideline (FVC ?45% predicted at Screening).  Subjects on pirfenidone or nintedanib must be on a stable and optimized dose for ?30 days prior to Baseline. Concomitant use of both pirfenidone and nintedanib is not permitted. Males with a partner of childbearing potential must use a condom for the duration of treatment and for at least 48 hours after discontinuing study drug.   Key Exclusion Criteria: Subject is pregnant or lactating.  FEV1/FVC <0.70 at Screening.  Prior intolerance or significant lack of efficacy to a prostacyclin or prostacyclin analogue that resulted in discontinuation or inability to effectively titrate that therapy.  The subject has  received any PAH-approved therapy, including prostacyclin therapy (epoprostenol, treprostinil, iloprost, or beraprost; except for acute vasoreactivity testing), IP receptor agonists (selexipag), endothelin receptor antagonists, phosphodiesterase type 5 inhibitors (PDE5-Is), or soluble guanylate cyclase stimulators within 60 days prior to Baseline. As needed use of a PDE5-I for erectile dysfunction is permitted, provided no doses are taken within 48 hours of any study-related efficacy assessments.  Use of any of the following medications:  `     a. Azathioprine (AZA), cyclosporine, mycophenolate mofetil, tacroliumus, oral           corticosteroids (OCS) >20 mg/day or the combination of            OCS+AZA+N-acetylcysteine within 30 days prior to Baseline.        b. Cyclophosphamide within 60 days prior to Baseline        c. Rituximab within 6 months prior to Baseline   Receiving >10 L/min of oxygen at rest at Baseline.   Exacerbation of IPF or active pulmonary or upper respiratory infection within 30 days prior to Baseline. Subjects must have completed any antibiotic or steroid regimens for treatment of the infection or acute exacerbation more than 30 days prior to Baseline to be eligible. If hospitalized for an acute exacerbation of IPF or a pulmonary or upper respiratory infection, subjects must have been discharged more than 90 days prior to Baseline to be eligible.  Uncontrolled cardiac disease, defined as myocardial infarction within 6 months prior to Baseline or unstable angina within 30 days prior to Baseline. Use of any investigational drug/device or participation in any investigational study in which the subject received a medical intervention (ie, procedure, device, medication/supplement) within 30 days prior to Screening. Subjects participating in non-interventional, observational, or registry studies are eligible.  Life expectancy <6 months due to IPF or a concomitant illness.  Acute  pulmonary  embolism within 90 days prior to Baseline.   Pharmacodynamics:  (Dose-Response Relationships) - In a clinical study of 241 healthy volunteers, single doses of Tyvaso 54 ?g (the target maintenance dose per session) and 84 ?g (supratherapeutic inhalation dose) prolonged the QTc interval by approximately 10 ms. The QTc effect dissipated rapidly as the concentration of treprostinil decreased.  Pharmacokinetics: (ADME) - Treprostinil is substantially metabolized by the liver, primarily by CYP2C8.  The elimination of treprostinil (following [place] administration of treprostinil) is biphasic, with a terminal elimination t1?2 of approximately 4 hours.    Contraindications:   None  Special Warnings/Considerations: Treprostinil undergoes substantial hepatic metabolism; thus, liver impairment could result in decreased metabolism and increased systemic exposure to treprostinil potentiating the occurrence and/or severity of AEs and/or overdose. There are no adequate and well-controlled studies with Tyvaso in pregnant women. There are risks to the mother and the fetus associated with PAH. Patients who discontinue the use of any effective therapy may be expected to notice a decline in their clinical status. Patients who abruptly discontinue therapy (ie, miss 2 or more doses), or markedly reduce the dose, are at risk for worsening of PAH symptoms or a rebound in [facility]. There is no evidence to suggest or suspect that patients taking inhaled treprostinil would be at risk for withdrawal symptoms or rebound effects.  Treprostinil inhibits platelet aggregation and increases the risk of bleeding, particularly with concomitant anticoagulants and antiplatelet therapies. Treprostinil is a pulmonary and systemic vasodilator. In patients with low systemic arterial pressure, inhaled treprostinil solution may cause symptomatic hypotension, which may present as but is not limited to light-headedness, dizziness, blurred or faded vision,  and syncope. Concomitant administration of treprostinil with diuretics, antihypertensive agents, or other vasodilators that may lower blood pressure increases the risk of symptomatic hypotension. Signs and symptoms of overdose with inhaled treprostinil solution may correspond to dose-limiting pharmacologic effects, including diarrhea, flushing, headache, hypotension, nausea, and vomiting. Most events are self-limiting and resolve with reduction or withholding of inhaled treprostinil solution.  Interactions:              Drug Interaction Studies Conducted with Treprostinil: Study # P01:08: Treprostinil formulation [place] Remodulin vs Acetaminophen, to evaluate effect of acetaminophen on Treprostinil pK, showed no effect. Study # P01:12: Treprostinil formulation [place] Remodulin vs Warfarin, to evaluate effect of Warfarin on Treprostinil pK/PD, showed no effect. Study TDE-PH-105: Treprostinil formulation Orenitram vs Bosentan, to evaluate effect of Bosentan on Treprostinil pK, showed no effect. Study TDE-PH-106: Treprostinil formulation Orenitram vs Sildenafil, to evaluate effect of Sildenafil on Treprostinil pK, showed no effect. Study TDE-PH-109: Treprostinil formulation Orenitram vs Rifampicin (CYP2C8/9 inducer), to evaluate the effect of inducing the DEY8X4 metabolic pathway on the pK of Treprostinil, confirmed metabolic pathway via GYJ8H6/3. Expected interaction resulting in reduced Cmax and AUC. Study TDE-PH-110: Treprostinil formulation Orenitram vs Gemfibrozil (CYP2C8  inhibitor), to evaluate the effect of inhibiting the JSH7W2 metabolic pathway on the pK of Treprostinil, confirmed metabolic pathway primarily via CYP2C8. Expected interaction resulting in significantly increased Cmax and AUC. Study TDE-PH-110: Treprostinil formulation Orenitram vs Fluconazole (CYP2C9 inhibitor), to evaluate the effect of inhibiting the OVZ8H8 metabolic pathway on on the pK of Treprostinil, had no notable effect. Confirmed  CYP2C9 plays a minor role in treprostinil metabolism.  Serious Adverse Reactions Observed in Clinical Studies with Inhaled Treprostinil: Pulmonary Arterial Hypertension: all occurrences from RIN-PH-301 study for Pulmonary Arterial Hypertension. No serious adverse reactions met criteria for inclusion in inhaled treprostinil solution clinical studies for interstitial lung disease.  System/Organ Class: Nervous System disorders - Syncope in 3 of 115 subjects exposed (2.6%)      Serious Adverse Reactions Observed Postmarketing: Gastrointestinal disorders: vomiting, nausea, diarrhea, GI Hemorrhage (reflects an aggregate of the following: GI hemorrhage, rectal hemorrhage, blood in stool [hematochezia or melena], and hematemesis). General disorders and administration site conditions: Pain Infections and infestations: Pneumonia Nervous system disorders: Syncope, dizziness, headache Respiratory, thoracic and mediastinal disorders: Hemoptysis, cough, epistaxis Vascular disorders: Hypotension, hemorrhage (unspecified blood loss ranging from nondescript to heavy or excessive bleeding).  Safety Data:  As of 28 December 2020, approximately 955 subjects have received treprostinil by inhalation across various studies, ranging from acute studies in healthy volunteers to long-term treatment in subjects with PAH and PH-ILD.   The estimate of worldwide exposure to treprostinil in the marketed inhaled formulations is approximately 86,751,982 total patient treatment-days (approximately 34,318 patient treatment-years). The most common adverse effects of inhaled treprostinil ?10% were cough, diarrhea, dizziness, flushing, headache, muscle/jaw/bone pain, nausea, pharyngolaryngeal pain, oropharyngeal pain, syncope, and throat irritation. Other adverse reactions in the observational study comparing subjects taking inhaled treprostinil and a control group were cough, hemoptysis, nasal discomfort, and throat irritation. Angioedema is  a rare but serious reaction that has been seen in the post-marketing setting with treprostinil.  Stability:  Ampoules of drug product are stable until the date indicated when stored in the unopened foil pouch. The [place] commercial labeling states the following regarding storage conditions: Store at [place] to [place] (38F to 27F), with excursions permitted to 42R to 98S (27F to 48F).  Once the foil pack is opened, ampoules should be used within 7 days. Because Tyvaso is light-sensitive, unopened ampoules should be stored in the foil pouch.  [facility] [place] Coordinator note :   This visit for Subject Erica [name] with DOB: 04/19/1956 on 12/31/2021 for the above protocol is Visit/Encounter # Baseline and is for purpose of research.   The consent for this encounter is under Protocol Version: Amendment 3 approved 05 Oct 2020  IB: version17-  18JUL 2021  ICF: Amendment 1 Version 2.0 approved 29Dec2020 is currently IRB approved.   Subject expressed continued interest and consent in continuing as a study subject. Subject confirmed that there was  no change in contact information (e.g. address, telephone, email). Subject thanked for participation in research and contribution to science.  In this visit 12/31/2021 the subject will be evaluated by  Sub-Investigator named Dr. Linna Darner  . This research coordinator has verified that the above investigator is up to date with his/her training logs.   The Subject was informed that the PI Dr. Chase Caller continues to have oversight of the subject's visits and course  through relevant discussions, reviews and also specifically of this visit by routing of this note to the [place].  Patient was randomized today after all study procedures were completed. She was instructed on how to use the device and dosing. She was informed of possible side effects including cough and mild headache. She verbalized understanding of all instructions. All procedures were  completed per the above mentioned protocol. Refer to the subjects paper source binder for further details of the visit.    Signed by [place] Assistant  [facility]  [place], [place] 12:47 PM 12/31/2021

## 2022-01-01 NOTE — Progress Notes (Signed)
Inclusion exclusion criteria reviewed and discussed with patient.  She stated that she had had a pericardial effusion noted on echocardiogram in March of this year.  Those data were reviewed.  Echo of 08/30/2021 was compared to one performed 11/03/2020.  Grade 1 diastolic dysfunction was present as well as LVH.  This was described as moderate LVH on 08/30/2021.  Also moderate AI was present with mild LAE and moderate aortic regurgitation.  Only trace pericardial effusion was noted on the echocardiogram 08/30/2021.  Cardiology follow-up with Dr. Rex Kras, DO is to be completed October 2023. Inclusion exclusion criteria met. No exam performed. Hendricks Limes MD, SI

## 2022-01-06 DIAGNOSIS — N3946 Mixed incontinence: Secondary | ICD-10-CM | POA: Diagnosis not present

## 2022-01-06 DIAGNOSIS — E119 Type 2 diabetes mellitus without complications: Secondary | ICD-10-CM | POA: Diagnosis not present

## 2022-01-08 ENCOUNTER — Emergency Department (HOSPITAL_BASED_OUTPATIENT_CLINIC_OR_DEPARTMENT_OTHER): Payer: Medicare HMO

## 2022-01-08 ENCOUNTER — Other Ambulatory Visit: Payer: Self-pay

## 2022-01-08 ENCOUNTER — Emergency Department (HOSPITAL_COMMUNITY): Payer: Medicare HMO

## 2022-01-08 ENCOUNTER — Emergency Department (HOSPITAL_COMMUNITY)
Admission: EM | Admit: 2022-01-08 | Discharge: 2022-01-08 | Disposition: A | Discharge status: Home / Self Care | Payer: Medicare HMO | Attending: Emergency Medicine | Admitting: Emergency Medicine

## 2022-01-08 DIAGNOSIS — Z7982 Long term (current) use of aspirin: Secondary | ICD-10-CM | POA: Insufficient documentation

## 2022-01-08 DIAGNOSIS — M79662 Pain in left lower leg: Secondary | ICD-10-CM | POA: Insufficient documentation

## 2022-01-08 DIAGNOSIS — E1140 Type 2 diabetes mellitus with diabetic neuropathy, unspecified: Secondary | ICD-10-CM | POA: Insufficient documentation

## 2022-01-08 DIAGNOSIS — Z794 Long term (current) use of insulin: Secondary | ICD-10-CM | POA: Insufficient documentation

## 2022-01-08 DIAGNOSIS — M79605 Pain in left leg: Secondary | ICD-10-CM

## 2022-01-08 DIAGNOSIS — J449 Chronic obstructive pulmonary disease, unspecified: Secondary | ICD-10-CM | POA: Insufficient documentation

## 2022-01-08 DIAGNOSIS — Z8673 Personal history of transient ischemic attack (TIA), and cerebral infarction without residual deficits: Secondary | ICD-10-CM | POA: Diagnosis not present

## 2022-01-08 DIAGNOSIS — Z79899 Other long term (current) drug therapy: Secondary | ICD-10-CM | POA: Insufficient documentation

## 2022-01-08 DIAGNOSIS — M25552 Pain in left hip: Secondary | ICD-10-CM | POA: Diagnosis not present

## 2022-01-08 DIAGNOSIS — I10 Essential (primary) hypertension: Secondary | ICD-10-CM | POA: Diagnosis not present

## 2022-01-08 LAB — CBC WITH DIFFERENTIAL/PLATELET
Abs Immature Granulocytes: 0.02 10*3/uL (ref 0.00–0.07)
Basophils Absolute: 0 10*3/uL (ref 0.0–0.1)
Basophils Relative: 0 %
Eosinophils Absolute: 0 10*3/uL (ref 0.0–0.5)
Eosinophils Relative: 1 %
HCT: 31.7 % — ABNORMAL LOW (ref 36.0–46.0)
Hemoglobin: 11.3 g/dL — ABNORMAL LOW (ref 12.0–15.0)
Immature Granulocytes: 1 %
Lymphocytes Relative: 21 %
Lymphs Abs: 0.8 10*3/uL (ref 0.7–4.0)
MCH: 32 pg (ref 26.0–34.0)
MCHC: 35.6 g/dL (ref 30.0–36.0)
MCV: 89.8 fL (ref 80.0–100.0)
Monocytes Absolute: 0.2 10*3/uL (ref 0.1–1.0)
Monocytes Relative: 6 %
Neutro Abs: 2.8 10*3/uL (ref 1.7–7.7)
Neutrophils Relative %: 71 %
Platelets: 185 10*3/uL (ref 150–400)
RBC: 3.53 MIL/uL — ABNORMAL LOW (ref 3.87–5.11)
RDW: 14.7 % (ref 11.5–15.5)
WBC: 3.9 10*3/uL — ABNORMAL LOW (ref 4.0–10.5)
nRBC: 0 % (ref 0.0–0.2)

## 2022-01-08 LAB — BASIC METABOLIC PANEL
Anion gap: 7 (ref 5–15)
BUN: 23 mg/dL (ref 8–23)
CO2: 23 mmol/L (ref 22–32)
Calcium: 9.6 mg/dL (ref 8.9–10.3)
Chloride: 111 mmol/L (ref 98–111)
Creatinine, Ser: 1.69 mg/dL — ABNORMAL HIGH (ref 0.44–1.00)
GFR, Estimated: 33 mL/min — ABNORMAL LOW (ref >60)
Glucose, Bld: 120 mg/dL — ABNORMAL HIGH (ref 70–99)
Potassium: 4.1 mmol/L (ref 3.5–5.1)
Sodium: 141 mmol/L (ref 135–145)

## 2022-01-08 MED ORDER — ACETAMINOPHEN 500 MG PO TABS
1000.0000 mg | ORAL_TABLET | Freq: Once | ORAL | Status: AC
  Administered 2022-01-08: 1000 mg via ORAL
  Filled 2022-01-08: qty 2

## 2022-01-08 MED ORDER — MORPHINE SULFATE (PF) 4 MG/ML IV SOLN
4.0000 mg | Freq: Once | INTRAVENOUS | Status: AC
  Administered 2022-01-08: 4 mg via INTRAMUSCULAR
  Filled 2022-01-08: qty 1

## 2022-01-08 MED ORDER — OXYCODONE HCL 5 MG PO TABS: 5.0000 mg | ORAL_TABLET | ORAL | 0 refills | Status: DC | PRN

## 2022-01-08 MED ORDER — HYDROCODONE-ACETAMINOPHEN 5-325 MG PO TABS
1.0000 | ORAL_TABLET | Freq: Once | ORAL | Status: AC
  Administered 2022-01-08: 1 via ORAL
  Filled 2022-01-08: qty 1

## 2022-01-08 NOTE — ED Notes (Addendum)
MD Truett Mainland notified that patient's support person stated that "she couldn't stand nor walk" MD requested doppler for patient. Doppler given to MD.

## 2022-01-08 NOTE — ED Notes (Signed)
Gave pain medication and pt asked for wheelchair. Pt and family member upset stating that MD said she was supposed to be walked which was not relayed to this nurse. Pt unable to walk to wheelchair but pivots, family member concerned about D/C. Provider notified

## 2022-01-08 NOTE — ED Triage Notes (Signed)
Pt from home w/ L leg pain from hip to knee, feels as though its a pins and needles feeling starting yesterday. Per EMS L leg feels cold to the touch. Strong pulses to feet bilaterally. VSS.

## 2022-01-08 NOTE — ED Notes (Signed)
This nurse went to D/C patient. Pt states pain medicine wearing off, wants something before D/C. Notified provider

## 2022-01-08 NOTE — ED Provider Notes (Signed)
[facility] EMERGENCY DEPARTMENT Provider Note   CSN: [id number] Arrival date & time: 01/08/22  1323     History  Chief Complaint  Patient presents with   possible dvt    Leg Pain    Erica [name] is a 66 y.o. female.   Leg Pain   Patient with medical history of hypertension, DMT2, COPD, neuropathy, MDD, history of previous CVA/strokes presents today due to left lower extremity pain.  3 days ago she started having pain to the left lower extremity posteriorly from her hip down to her ankle.  This happened atraumatically.  The pain feels like pins and needles, it is worse with ambulation but she feels it constantly.    At baseline she walks without assistance, she states she has been unable to bear weight as of yesterday secondary to pain.    Patient is not anticoagulated, takes baby aspirin and gabapentin.  This has not helped her left lower extremity pain.  No recent travels or surgeries, no chest pain, shortness of breath, upper extremity weakness or numbness, lower extremity swelling, skin changes, fevers.  She was seen in the ED 11/22/2021 for left lower extremity weakness, negative stroke work-up at that time.  Home Medications Prior to Admission medications   Medication Sig Start Date End Date Taking? Authorizing Provider  acetaminophen (TYLENOL) 325 MG tablet Take 2 tablets (650 mg total) by mouth every 4 (four) hours as needed for mild pain (or temp > 37.5 C (99.5 F)). 07/14/20   Angiulli, Lavon Paganini, PA-C  albuterol (VENTOLIN HFA) 108 (90 Base) MCG/ACT inhaler Inhale 2 puffs into the lungs every 4 (four) hours as needed for wheezing. 11/20/20   Angiulli, Lavon Paganini, PA-C  amLODipine (NORVASC) 10 MG tablet Take 1 tablet (10 mg total) by mouth daily. 11/20/20   Angiulli, Lavon Paganini, PA-C  aspirin EC 81 MG EC tablet Take 1 tablet (81 mg total) by mouth daily. Swallow whole. 11/07/20   Mercy Riding, MD  ENTRESTO 49-51 MG TAKE 1 TABLET BY MOUTH TWICE DAILY Patient  taking differently: Take 1 tablet by mouth 2 (two) times daily. 11/19/21   Tolia, Sunit, DO  famotidine (PEPCID) 20 MG tablet Take 1 tablet (20 mg total) by mouth daily. 11/20/20   Angiulli, Lavon Paganini, PA-C  fenofibrate (TRICOR) 145 MG tablet TAKE 1 TABLET(145 MG) BY MOUTH DAILY Patient taking differently: Take 145 mg by mouth daily. 09/06/21   Tolia, Sunit, DO  gabapentin (NEURONTIN) 100 MG capsule Take 2 capsules (200 mg total) by mouth 2 (two) times daily as needed (pain). Patient taking differently: Take 100 mg by mouth 2 (two) times daily. 11/20/20   Angiulli, Lavon Paganini, PA-C  hydrALAZINE (APRESOLINE) 25 MG tablet Take 25 mg by mouth in the morning and at bedtime. 09/12/21   [provider]  insulin glargine (LANTUS SOLOSTAR) 100 UNIT/ML Solostar Pen Please provide 24 units every a.m. and 20 units every p.m. Patient taking differently: Inject 18-24 Units into the skin See admin instructions. 24 units in the morning 18 units at bedtime 11/20/20   Angiulli, Lavon Paganini, PA-C  Insulin Pen Needle (PEN NEEDLES) 32G X 6 MM MISC 1 application by Does not apply route 2 (two) times daily. 11/20/20   Love, Ivan Anchors, PA-C  Multiple Vitamin (MULTIVITAMIN ADULT PO) Take 1 tablet by mouth daily. 07/11/19   [provider]  pantoprazole (PROTONIX) 40 MG tablet Take 40 mg by mouth daily.    [provider]  Pirfenidone (  ESBRIET) 801 MG TABS Take 801 mg by mouth with breakfast, with lunch, and with evening meal. Patient taking differently: Take 801 mg by mouth in the morning and at bedtime. 11/16/21   Tolia, Sunit, DO  rosuvastatin (CRESTOR) 20 MG tablet TAKE 1 TABLET(20 MG) BY MOUTH DAILY Patient taking differently: Take 20 mg by mouth daily. 10/04/21   Tolia, Sunit, DO  sertraline (ZOLOFT) 50 MG tablet Take 50 mg by mouth daily.    [provider]  SYMBICORT 160-4.5 MCG/ACT inhaler INHALE 2 PUFFS INTO THE LUNGS TWICE DAILY 10/18/21   Brand Males, MD  Tiotropium Bromide Monohydrate  (SPIRIVA RESPIMAT) 1.25 MCG/ACT AERS Inhale 2 puffs into the lungs daily. 05/17/21   Brand Males, MD  venlafaxine XR (EFFEXOR-XR) 150 MG 24 hr capsule Take 150 mg by mouth daily. 09/19/21   [provider]      Allergies    Penicillins    Review of Systems   Review of Systems  Physical Exam Updated Vital Signs BP 137/79 (BP Location: Right Arm)   Pulse 77   Temp 98.4 F (36.9 C) (Oral)   Resp 18   Ht 5' 3" (1.6 m)   Wt 80 kg   SpO2 100%   BMI 31.24 kg/m  Physical Exam Vitals and nursing note reviewed. Exam conducted with a chaperone present.  Constitutional:      General: She is not in acute distress.    Appearance: Normal appearance.  HENT:     Head: Normocephalic and atraumatic.  Eyes:     General: No scleral icterus.    Extraocular Movements: Extraocular movements intact.     Pupils: Pupils are equal, round, and reactive to light.  Cardiovascular:     Rate and Rhythm: Normal rate and regular rhythm.     Pulses: Normal pulses.     Comments: DP PT 1+ symmetric bilaterally Pulmonary:     Effort: Pulmonary effort is normal.  Musculoskeletal:        General: Tenderness present. No swelling.     Comments: LE: Able to raise lower extremity from the hip.  No tenderness palpation to the left hip, knee, ankle.  TTP posteriorly to the calf with palpation, knee posteriorly and posterior thigh.  Tolerates passive ROM to each individual joint  Skin:    General: Skin is warm.     Capillary Refill: Capillary refill takes less than 2 seconds.     Coloration: Skin is not jaundiced.     Findings: No erythema.     Comments: No overlying erythema.  Lower extremity warm bilaterally and tactile temperature is roughly the same bilaterally.  Neurological:     Mental Status: She is alert. Mental status is at baseline.     Coordination: Coordination normal.     Comments: Sensation to light touch is grossly intact circumferentially to the left lower extremity  Psychiatric:         Mood and Affect: Mood normal.     ED Results / Procedures / Treatments   Labs (all labs ordered are listed, but only abnormal results are displayed) Labs Reviewed  BASIC METABOLIC PANEL  CBC WITH DIFFERENTIAL/PLATELET    EKG None  Radiology No results found.  Procedures Procedures    Medications Ordered in ED Medications  acetaminophen (TYLENOL) tablet 1,000 mg (has no administration in time range)    ED Course/ Medical Decision Making/ A&P Clinical Course as of 01/08/22 1544  Sat Jan 08, 2022  1525 DVT study Poss neuropathy if -  F/u neuro [CR]    Clinical Course User Index [CR] Wilnette Kales, PA                           Medical Decision Making Amount and/or Complexity of Data Reviewed Independent Historian: friend and EMS    Details: Disagree with EMS evaluation of cold limb. External Data Reviewed: radiology, ECG and notes.    Details: Patient followed by neurology, history of prior strokes and some residual left-sided weakness mostly to the upper extremity. Labs: ordered. Decision-making details documented in ED Course. Radiology:  Decision-making details documented in ED Course.  Risk OTC drugs. Prescription drug management.   Patient presents with left lower extremity pain.  Differential includes but not limited to DVT, ischemic limb, radiculopathy, neuropathy, PAD  On exam patient is neurovascularly intact, brisk cap refill.  DP and PT are 1+ and equal symmetrically and the limb is warm and well-perfused.  Not consistent with an ischemic limb based on my evaluation.  There is no skin discoloration overlying, not consistent with cellulitis.  Neuropathy is a consideration. There is no trauma so I do not think history of the this is a occult fracture, additionally do not think radiographic imaging would be particularly beneficial.  I ordered tylenol and morphine. Reviewed home medication list.   I ordered basic laboratory work-up including CBC  and BMP.  This is pending at time of signout.  I ordered vascular ultrasound of left lower extremity to evaluate for possible DVT.  This is pending at time of signout.  Patient's presentation seems most consistent with neuropathy versus DVT.  If negative for DVT I think she is appropriate for outpatient follow-up with neurology.  Disposition pending reevaluation after work-up.        Final Clinical Impression(s) / ED Diagnoses Final diagnoses:  None    Rx / DC Orders ED Discharge Orders     None         Sherrill Raring, [place] 01/08/22 1545    Tegeler, Gwenyth Allegra, MD 01/08/22 [phone]

## 2022-01-08 NOTE — ED Notes (Signed)
Patient and support person updated the plan of care. Notified that they were waiting on the decision of the provider.

## 2022-01-08 NOTE — Progress Notes (Signed)
Lower extremity venous left study completed.  Preliminary results relayed to [place], MD.  See CV Proc for preliminary results report.   Darlin Coco, RDMS, RVT

## 2022-01-08 NOTE — ED Notes (Signed)
Patient's support person walked up to nurse's station and requested to put patient back in wheelchair. I reminded her that she told me to put her in the bed because she feared the patient would fall out of the chair. I told the support person I will place the patient in the chair when she is discharged and ready to leave the building to avoid any accidents.

## 2022-01-08 NOTE — Discharge Instructions (Addendum)
Note the work-up today was overall negative for DVT, fracture.  Given your decreased renal function, I recommend avoiding NSAIDs including ibuprofen/Aleve because they can have further harm to your kidneys.  Continue taking gabapentin at home for neuropathic type pain.  You can use Tylenol as needed for pain.  I will provide a short course of pain medication.  I have attached a number to your discharge papers for neurology.  Make sure you contact them as they will be the people that we will be able to most help you regarding your symptoms today; I have also sent in a referral for the same neurology clinic so you should hear from them within 2 to 3 days for setting up an appointment..  Please do not hesitate to return to the emergency department for worrisome signs and symptoms we discussed, parent.

## 2022-01-08 NOTE — ED Provider Triage Note (Signed)
Emergency Medicine Provider Triage Evaluation Note  Erica [name] , a 66 y.o. female  was evaluated in triage.  Pt complains of left lower extremity pain.  Starts at her hip and moves down her lower extremity to the ankle.  This all the time, worse with movement.  Feels it posteriorly, denies any injury.  Patient states she used to be anticoagulated, not anymore  Review of Systems  Per HPI  Physical Exam  BP 137/79 (BP Location: Right Arm)   Pulse 77   Temp 98.4 F (36.9 C) (Oral)   Resp 18   Ht _0  (1.6 m)   Wt 80 kg   SpO2 100%   BMI 31.24 kg/m  Gen:   Awake, no distress   Resp:  Normal effort  MSK:   Moves extremities without difficulty  Other:  DP 2+ metric bilaterally.  Both lower extremities warm to touch, tenderness to cath posteriorly with palpation.  Able to raise lower extremity.  Medical Decision Making  Medically screening exam initiated at 1:58 PM.  Appropriate orders placed.  Ander Mitter was informed that the remainder of the evaluation will be completed by another provider, this initial triage assessment does not replace that evaluation, and the importance of remaining in the ED until their evaluation is complete.  Labs, DVT study.  Limb is warm and perfused, not ischemic limb   Sherrill Raring, PA-C 01/08/22 1359

## 2022-01-08 NOTE — ED Notes (Signed)
MD Scheving at bedside.

## 2022-01-08 NOTE — ED Notes (Signed)
Report taken from [facility], patient sitting in wheelchair upon introducing myself. Patient's support person requested patient be placed in bed. Patient taken out of chair and placed into bed, warm blankets given, stretcher locked, side rails placed up.

## 2022-01-08 NOTE — ED Provider Notes (Addendum)
Physical Exam  BP 118/79 (BP Location: Right Arm)   Pulse 72   Temp 98.2 F (36.8 C)   Resp 18   Ht _0  (1.6 m)   Wt 80 kg   SpO2 98%   BMI 31.24 kg/m   Physical Exam Vitals and nursing note reviewed.  Constitutional:      General: She is not in acute distress.    Appearance: She is well-developed.  HENT:     Head: Normocephalic and atraumatic.  Eyes:     Conjunctiva/sclera: Conjunctivae normal.  Cardiovascular:     Rate and Rhythm: Normal rate and regular rhythm.     Heart sounds: No murmur heard. Pulmonary:     Effort: Pulmonary effort is normal. No respiratory distress.     Breath sounds: Normal breath sounds.  Abdominal:     Palpations: Abdomen is soft.     Tenderness: There is no abdominal tenderness.  Musculoskeletal:        General: No swelling.     Cervical back: Neck supple.     Right lower leg: No edema.     Left lower leg: No edema.     Comments:  No midline tenderness of lumbar spine.  No paraspinal tenderness in the lumbar area.  No tenderness along piriformis.  No tenderness palpation of medial or lateral knee joint lines.  Straight leg raise negative bilaterally.  Patient also has tenderness to palpation of posterior left calf.  Pain is exacerbated with resisted plantarflexion.  Dorsalis pedis are equal bilaterally.  Muscle strength equal bilaterally.  DTRs symmetric bilaterally.  Patient has full active range of motion of bilateral hips, knees, ankles.  No overlying skin abnormalities noted.  Compartments supple.  Skin:    General: Skin is warm and dry.     Capillary Refill: Capillary refill takes less than 2 seconds.  Neurological:     Mental Status: She is alert.  Psychiatric:        Mood and Affect: Mood normal.     Procedures  Procedures  ED Course / MDM   Clinical Course as of 01/08/22 2043  Sat Jan 08, 2022  1525 DVT study Poss neuropathy if - F/u neuro [CR]    Clinical Course User Index [CR] Wilnette Kales, PA   Medical  Decision Making Amount and/or Complexity of Data Reviewed Labs: ordered. Radiology: ordered.  Risk OTC drugs. Prescription drug management.   Patient care handed off from [employer], [place] at shift change.  See her note for further details.  In short, patient began to have left lower extremity pain from her hip down to her ankle approximately 3 days ago.  She denies any history of known trauma.  She states it feels like pins-and-needles and is worse with ambulation but is constant in nature.  She denies history of DVT/PE, prior surgery or travel recently, chest pain, shortness of breath, upper or lower extremity weakness/numbness from baseline, fever, chills, night sweats.  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  Laboratory studies significant for no leukocytosis, mild anemia with hemoglobin 11.3.  Patient has baseline anemia and this is improved today from baseline.  GFR 33 with BUN of 23 and creatinine 1.69.  Patient has baseline renal dysfunction and this is slightly improved from baseline. Imaging studies include left lower extremity DVT ultrasound which was negative for a DVT.  Left hip  unilateral with pelvis ordered which was negative for any acute bony abnormality.  Patient's symptoms likely secondary to neuropathy.  Patient to be treated for said neuropathy with gabapentin while at home.  Patient limited with at home medicine she can take given current regimen with gabapentin as well as decreased renal function.  NSAID therapy not recommended given patient's renal function.  Recommend Tylenol at home.  Short course of opiates prescribed to help with pain.  Close follow-up with neurology outpatient recommended for further evaluation of symptoms; referral made while in the emergency department.  Close follow-up with PCP also recommended.  Doubt CVA.  Doubt DVT.   Doubt PAD.  Doubt acute fracture, AVN.  Doubt compartment syndrome given compartments are supple.  Doubt necrotizing fasciitis.  Doubt osteomyelitis.  Doubt fracture given lack of bony tenderness.  Doubt ischemic limb.  Doubt rhabdomyolysis.  Worrisome signs and symptoms were discussed with the patient the patient knowledge understanding to return to the emergency department if noticed.  Patient was stable upon discharge.        Wilnette Kales, [place] 01/08/22 1737    Wilnette Kales, PA 01/08/22 2043    Cristie Hem, MD 01/08/22 2214

## 2022-01-10 DIAGNOSIS — G5610 Other lesions of median nerve, unspecified upper limb: Secondary | ICD-10-CM | POA: Diagnosis not present

## 2022-01-10 DIAGNOSIS — M79609 Pain in unspecified limb: Secondary | ICD-10-CM | POA: Diagnosis not present

## 2022-01-10 DIAGNOSIS — E1321 Other specified diabetes mellitus with diabetic nephropathy: Secondary | ICD-10-CM | POA: Diagnosis not present

## 2022-01-10 DIAGNOSIS — I6789 Other cerebrovascular disease: Secondary | ICD-10-CM | POA: Diagnosis not present

## 2022-01-12 ENCOUNTER — Other Ambulatory Visit: Payer: Self-pay | Admitting: Internal Medicine

## 2022-01-12 DIAGNOSIS — M79662 Pain in left lower leg: Secondary | ICD-10-CM | POA: Diagnosis not present

## 2022-01-12 DIAGNOSIS — M79609 Pain in unspecified limb: Secondary | ICD-10-CM | POA: Diagnosis not present

## 2022-01-12 DIAGNOSIS — E1140 Type 2 diabetes mellitus with diabetic neuropathy, unspecified: Secondary | ICD-10-CM | POA: Diagnosis not present

## 2022-01-12 DIAGNOSIS — I10 Essential (primary) hypertension: Secondary | ICD-10-CM | POA: Diagnosis not present

## 2022-01-12 DIAGNOSIS — I6789 Other cerebrovascular disease: Secondary | ICD-10-CM | POA: Diagnosis not present

## 2022-01-12 DIAGNOSIS — E1321 Other specified diabetes mellitus with diabetic nephropathy: Secondary | ICD-10-CM | POA: Diagnosis not present

## 2022-01-13 LAB — BASIC METABOLIC PANEL WITH GFR
BUN/Creatinine Ratio: 17 (calc) (ref 6–22)
BUN: 32 mg/dL — ABNORMAL HIGH (ref 7–25)
CO2: 23 mmol/L (ref 20–32)
Calcium: 9.3 mg/dL (ref 8.6–10.4)
Chloride: 110 mmol/L (ref 98–110)
Creat: 1.9 mg/dL — ABNORMAL HIGH (ref 0.50–1.05)
Glucose, Bld: 165 mg/dL — ABNORMAL HIGH (ref 65–99)
Potassium: 4.7 mmol/L (ref 3.5–5.3)
Sodium: 140 mmol/L (ref 135–146)
eGFR: 29 mL/min/{1.73_m2} — ABNORMAL LOW (ref >=60)

## 2022-01-13 LAB — CK: Total CK: 70 U/L (ref 29–143)

## 2022-01-13 LAB — MAGNESIUM: Magnesium: 2.1 mg/dL (ref 1.5–2.5)

## 2022-01-13 LAB — EXTRA LAV TOP TUBE

## 2022-01-14 DIAGNOSIS — N183 Chronic kidney disease, stage 3 unspecified: Secondary | ICD-10-CM | POA: Diagnosis not present

## 2022-01-14 DIAGNOSIS — E1321 Other specified diabetes mellitus with diabetic nephropathy: Secondary | ICD-10-CM | POA: Diagnosis not present

## 2022-01-14 DIAGNOSIS — G5790 Unspecified mononeuropathy of unspecified lower limb: Secondary | ICD-10-CM | POA: Diagnosis not present

## 2022-01-14 DIAGNOSIS — G5610 Other lesions of median nerve, unspecified upper limb: Secondary | ICD-10-CM | POA: Diagnosis not present

## 2022-01-17 DIAGNOSIS — I10 Essential (primary) hypertension: Secondary | ICD-10-CM | POA: Diagnosis not present

## 2022-01-19 ENCOUNTER — Encounter (INDEPENDENT_AMBULATORY_CARE_PROVIDER_SITE_OTHER): Payer: Self-pay

## 2022-01-19 NOTE — Telephone Encounter (Signed)
Intake/Interview: 03/28/2022 Testing: 03/28/2022 Feedback: 04/13/2022

## 2022-01-21 DIAGNOSIS — I10 Essential (primary) hypertension: Secondary | ICD-10-CM | POA: Diagnosis not present

## 2022-01-21 DIAGNOSIS — E1321 Other specified diabetes mellitus with diabetic nephropathy: Secondary | ICD-10-CM | POA: Diagnosis not present

## 2022-01-21 DIAGNOSIS — G5790 Unspecified mononeuropathy of unspecified lower limb: Secondary | ICD-10-CM | POA: Diagnosis not present

## 2022-01-21 DIAGNOSIS — I6789 Other cerebrovascular disease: Secondary | ICD-10-CM | POA: Diagnosis not present

## 2022-01-22 ENCOUNTER — Other Ambulatory Visit: Payer: Self-pay | Admitting: Cardiology

## 2022-01-22 DIAGNOSIS — R0602 Shortness of breath: Secondary | ICD-10-CM

## 2022-01-22 DIAGNOSIS — I639 Cerebral infarction, unspecified: Secondary | ICD-10-CM | POA: Diagnosis not present

## 2022-01-22 DIAGNOSIS — Z95818 Presence of other cardiac implants and grafts: Secondary | ICD-10-CM | POA: Diagnosis not present

## 2022-01-22 DIAGNOSIS — Z794 Long term (current) use of insulin: Secondary | ICD-10-CM

## 2022-01-22 DIAGNOSIS — N183 Chronic kidney disease, stage 3 unspecified: Secondary | ICD-10-CM

## 2022-01-22 DIAGNOSIS — Z4509 Encounter for adjustment and management of other cardiac device: Secondary | ICD-10-CM | POA: Diagnosis not present

## 2022-01-24 ENCOUNTER — Ambulatory Visit: Payer: Medicare HMO | Admitting: Physical Therapy

## 2022-01-24 DIAGNOSIS — I10 Essential (primary) hypertension: Secondary | ICD-10-CM | POA: Diagnosis not present

## 2022-01-24 DIAGNOSIS — E1321 Other specified diabetes mellitus with diabetic nephropathy: Secondary | ICD-10-CM | POA: Diagnosis not present

## 2022-01-24 DIAGNOSIS — I6789 Other cerebrovascular disease: Secondary | ICD-10-CM | POA: Diagnosis not present

## 2022-01-24 DIAGNOSIS — G5792 Unspecified mononeuropathy of left lower limb: Secondary | ICD-10-CM | POA: Diagnosis not present

## 2022-01-27 ENCOUNTER — Encounter: Payer: Medicare HMO | Admitting: Internal Medicine

## 2022-01-27 ENCOUNTER — Ambulatory Visit: Payer: Medicare HMO | Admitting: Internal Medicine

## 2022-01-27 DIAGNOSIS — Z006 Encounter for examination for normal comparison and control in clinical research program: Secondary | ICD-10-CM

## 2022-01-27 DIAGNOSIS — R6889 Other general symptoms and signs: Secondary | ICD-10-CM | POA: Diagnosis not present

## 2022-01-27 DIAGNOSIS — J84112 Idiopathic pulmonary fibrosis: Secondary | ICD-10-CM

## 2022-01-28 NOTE — Research (Signed)
Title:A Randomized, Double-blind, Placebo-controlled, Phase 3 Study of the Efficacy and Safety of Inhaled Treprostinil in Subjects with Idiopathic Pulmonary Fibrosis    Dose and Duration of Treatment:Treprostinil for Inhalation 0.6 mg/mL, or placebo (randomly assigned 1:1) over a 52 week period.   Protocol # RIN-PF-301; IND # [id number]; Clinical Trials.gov Identifier: [id number]   Sponsor: [employer] [place], [place] [id number]   Key Features of the Drug: Treprostinil (LRX-15, 15AU81, UT-15) is a stable tricyclic benzindene analogue of the naturally occurring prostacyclin (PGI2; epoprostenol), a member of the eicosanoid family of autocoids; these compounds occur widely in tissues and have important pharmacological properties with potent activity, especially on the cardiovascular system and smooth muscle ([place]). An inhalation solution of treprostinil, 0.6 mg/mL, delivered using an ultrasonic nebulizer, is currently FDA approved to treat pulmonary arterial hypertension (PAH); [WHO Group 1, PH-ILD; WHO Group 3. Inhaled treprostinil is NOW being evaluated as primary treatment of IPF, idiopathic pulmonary fibrosis.    Key Inclusion Criteria: ?67 years of age Diagnosis of IPF based on the 2018 ATS/ERS/JRS/ALAT Clinical Practice Guideline (FVC ?45% predicted at Screening).  Subjects on pirfenidone or nintedanib must be on a stable and optimized dose for ?30 days prior to Baseline. Concomitant use of both pirfenidone and nintedanib is not permitted. Males with a partner of childbearing potential must use a condom for the duration of treatment and for at least 48 hours after discontinuing study drug.    Key Exclusion Criteria: Subject is pregnant or lactating.  FEV1/FVC <0.70 at Screening.  Prior intolerance or significant lack of efficacy to a prostacyclin or prostacyclin analogue that resulted in discontinuation or inability to effectively titrate that therapy.  The  subject has received any PAH-approved therapy, including prostacyclin therapy (epoprostenol, treprostinil, iloprost, or beraprost; except for acute vasoreactivity testing), IP receptor agonists (selexipag), endothelin receptor antagonists, phosphodiesterase type 5 inhibitors (PDE5-Is), or soluble guanylate cyclase stimulators within 60 days prior to Baseline. As needed use of a PDE5-I for erectile dysfunction is permitted, provided no doses are taken within 48 hours of any study-related efficacy assessments.  Use of any of the following medications:  `     a. Azathioprine (AZA), cyclosporine, mycophenolate mofetil, tacroliumus, oral           corticosteroids (OCS) >20 mg/day or the combination of            OCS+AZA+N-acetylcysteine within 30 days prior to Baseline.        b. Cyclophosphamide within 60 days prior to Baseline        c. Rituximab within 6 months prior to Baseline   Receiving >10 L/min of oxygen at rest at Baseline.   Exacerbation of IPF or active pulmonary or upper respiratory infection within 30 days prior to Baseline. Subjects must have completed any antibiotic or steroid regimens for treatment of the infection or acute exacerbation more than 30 days prior to Baseline to be eligible. If hospitalized for an acute exacerbation of IPF or a pulmonary or upper respiratory infection, subjects must have been discharged more than 90 days prior to Baseline to be eligible.  Uncontrolled cardiac disease, defined as myocardial infarction within 6 months prior to Baseline or unstable angina within 30 days prior to Baseline. Use of any investigational drug/device or participation in any investigational study in which the subject received a medical intervention (ie, procedure, device, medication/supplement) within 30 days prior to Screening. Subjects participating in non-interventional, observational, or registry studies are eligible.  Life expectancy <6 months due to IPF  or a concomitant illness.   Acute pulmonary embolism within 90 days prior to Baseline.    Pharmacodynamics:  (Dose-Response Relationships) - In a clinical study of 241 healthy volunteers, single doses of Tyvaso 54 ?g (the target maintenance dose per session) and 84 ?g (supratherapeutic inhalation dose) prolonged the QTc interval by approximately 10 ms. The QTc effect dissipated rapidly as the concentration of treprostinil decreased.   Pharmacokinetics: (ADME) - Treprostinil is substantially metabolized by the liver, primarily by CYP2C8.  The elimination of treprostinil (following Pyote administration of treprostinil) is biphasic, with a terminal elimination t1?2 of approximately 4 hours.     Contraindications:   None   Special Warnings/Considerations: Treprostinil undergoes substantial hepatic metabolism; thus, liver impairment could result in decreased metabolism and increased systemic exposure to treprostinil potentiating the occurrence and/or severity of AEs and/or overdose. There are no adequate and well-controlled studies with Tyvaso in pregnant women. There are risks to the mother and the fetus associated with PAH. Patients who discontinue the use of any effective therapy may be expected to notice a decline in their clinical status. Patients who abruptly discontinue therapy (ie, miss 2 or more doses), or markedly reduce the dose, are at risk for worsening of PAH symptoms or a rebound in [facility]. There is no evidence to suggest or suspect that patients taking inhaled treprostinil would be at risk for withdrawal symptoms or rebound effects.  Treprostinil inhibits platelet aggregation and increases the risk of bleeding, particularly with concomitant anticoagulants and antiplatelet therapies. Treprostinil is a pulmonary and systemic vasodilator. In patients with low systemic arterial pressure, inhaled treprostinil solution may cause symptomatic hypotension, which may present as but is not limited to light-headedness, dizziness,  blurred or faded vision, and syncope. Concomitant administration of treprostinil with diuretics, antihypertensive agents, or other vasodilators that may lower blood pressure increases the risk of symptomatic hypotension. Signs and symptoms of overdose with inhaled treprostinil solution may correspond to dose-limiting pharmacologic effects, including diarrhea, flushing, headache, hypotension, nausea, and vomiting. Most events are self-limiting and resolve with reduction or withholding of inhaled treprostinil solution.   Interactions:              Drug Interaction Studies Conducted with Treprostinil: Study # P01:08: Treprostinil formulation [place] Remodulin vs Acetaminophen, to evaluate effect of acetaminophen on Treprostinil pK, showed no effect. Study # P01:12: Treprostinil formulation [place] Remodulin vs Warfarin, to evaluate effect of Warfarin on Treprostinil pK/PD, showed no effect. Study TDE-PH-105: Treprostinil formulation Orenitram vs Bosentan, to evaluate effect of Bosentan on Treprostinil pK, showed no effect. Study TDE-PH-106: Treprostinil formulation Orenitram vs Sildenafil, to evaluate effect of Sildenafil on Treprostinil pK, showed no effect. Study TDE-PH-109: Treprostinil formulation Orenitram vs Rifampicin (CYP2C8/9 inducer), to evaluate the effect of inducing the KLK9Z7 metabolic pathway on the pK of Treprostinil, confirmed metabolic pathway via HXT0V6/9. Expected interaction resulting in reduced Cmax and AUC. Study TDE-PH-110: Treprostinil formulation Orenitram vs Gemfibrozil (CYP2C8  inhibitor), to evaluate the effect of inhibiting the VXY8A1 metabolic pathway on the pK of Treprostinil, confirmed metabolic pathway primarily via CYP2C8. Expected interaction resulting in significantly increased Cmax and AUC. Study TDE-PH-110: Treprostinil formulation Orenitram vs Fluconazole (CYP2C9 inhibitor), to evaluate the effect of inhibiting the KPV3Z4 metabolic pathway on on the pK of Treprostinil, had no  notable effect. Confirmed CYP2C9 plays a minor role in treprostinil metabolism.   Serious Adverse Reactions Observed in Clinical Studies with Inhaled Treprostinil: Pulmonary Arterial Hypertension: all occurrences from RIN-PH-301 study for Pulmonary Arterial Hypertension. No serious adverse reactions met criteria  for inclusion in inhaled treprostinil solution clinical studies for interstitial lung disease. System/Organ Class: Nervous System disorders - Syncope in 3 of 115 subjects exposed (2.6%)       Serious Adverse Reactions Observed Postmarketing: Gastrointestinal disorders: vomiting, nausea, diarrhea, GI Hemorrhage (reflects an aggregate of the following: GI hemorrhage, rectal hemorrhage, blood in stool [hematochezia or melena], and hematemesis). General disorders and administration site conditions: Pain Infections and infestations: Pneumonia Nervous system disorders: Syncope, dizziness, headache Respiratory, thoracic and mediastinal disorders: Hemoptysis, cough, epistaxis Vascular disorders: Hypotension, hemorrhage (unspecified blood loss ranging from nondescript to heavy or excessive bleeding).   Safety Data:  As of 28 December 2020, approximately 955 subjects have received treprostinil by inhalation across various studies, ranging from acute studies in healthy volunteers to long-term treatment in subjects with PAH and PH-ILD.    The estimate of worldwide exposure to treprostinil in the marketed inhaled formulations is approximately 68,341,962 total patient treatment-days (approximately 34,318 patient treatment-years). The most common adverse effects of inhaled treprostinil ?10% were cough, diarrhea, dizziness, flushing, headache, muscle/jaw/bone pain, nausea, pharyngolaryngeal pain, oropharyngeal pain, syncope, and throat irritation. Other adverse reactions in the observational study comparing subjects taking inhaled treprostinil and a control group were cough, hemoptysis, nasal discomfort, and  throat irritation. Angioedema is a rare but serious reaction that has been seen in the post-marketing setting with treprostinil.   Stability:  Ampoules of drug product are stable until the date indicated when stored in the unopened foil pouch. The [place] commercial labeling states the following regarding storage conditions: Store at [place] to [place] (41F to 60F), with excursions permitted to 22L to 79G (59F to 32F).  Once the foil pack is opened, ampoules should be used within 7 days. Because Tyvaso is light-sensitive, unopened ampoules should be stored in the foil pouch.   [facility] Coordinator note :    This visit for Subject Erica [name] with DOB: Mar 16, 1956 on 01/27/2022 for the above protocol is Visit/Encounter # week 4 and is for purpose of research.    The consent for this encounter is under Protocol Version: Amendment 3 approved 05 Oct 2020  IB: version17-  18JUL 2021  ICF: Amendment 1 Version 2.0 approved 29Dec2020 is currently IRB approved.    Subject expressed continued interest and consent in continuing as a study subject. Subject confirmed that there was  no change in contact information (e.g. address, telephone, email). Subject thanked for participation in research and contribution to science.  In this visit 01/27/2022 there will be no exam by investigator.    The Subject was informed that the PI Dr. Chase Caller continues to have oversight of the subject's visits and course  through relevant discussions, reviews and also specifically of this visit by routing of this note to the [place].   Patient arrived to visit without medication. Verified with PI and subject would return the next day ([id number]).  Subject was instructed to be sure to bring back all used and unused medication vials, to not throw them away, and bring her diary to every single visit. She verbalized understanding of all instructions. All procedures were completed per the above mentioned protocol.  Refer to the subjects paper source binder for further details of the visit.  Subject returned her medication on 18Aug2023.  The accountability was inaccurate, so subject was educated to be using a new vial every single day and not to wait until the machine told her it was out of liquid. She verbalized understanding.  Accountability documented  in subject source in binder.  Signed: Jaye Beagle RN MSN/MBA 01/28/2022 at 2:04 PM Clinical Research Nurse Coordinator

## 2022-02-03 ENCOUNTER — Telehealth: Payer: Self-pay | Admitting: Internal Medicine

## 2022-02-03 NOTE — Telephone Encounter (Signed)
Ms. Desrocher is a subject in the following study:  Title:A Randomized, Double-blind, Placebo-controlled, Phase 3 Study of the Efficacy and Safety of Inhaled Treprostinil in Subjects with Idiopathic Pulmonary Fibrosis   Dose and Duration of Treatment:Treprostinil for Inhalation 0.6 mg/mL, or placebo (randomly assigned 1:1) over a 52 week period.  Protocol # RIN-PF-301; IND # [id number]; Clinical Trials.gov Identifier: [id number]   She called today complaining of having numbness of her tongue after taking her study medication. She states this started when she increased to 12 puffs a day on 21Aug2023, numbness did not occur prior to this dose. She states the numbness lasts a couple of hours, resolves on it's own, but then returns after she completes her next scheduled dose of 12 puffs. Subject states she is using mouthwash after each treatment but this is not helping the numbness.  Subject states the numbness is only felt on her tongue and not in her throat or face. Subject denies any trouble breathing or swallowing, no itching or soreness on the mouth or tongue. I reviewed with the subject the proper dosing and use of the medication and verified with the subject that she is taking her study medication as directed. I advsied the subjet I will speak with Dr. Chase Caller, PI and get his recommendations on how to proceed, but to hold off on taking any more medication until she hears back from me.  Subject stated understanding.   Please advise.   Killeen Bing, CMA, BS, [facility] Clinical Research Coordinator

## 2022-02-03 NOTE — Telephone Encounter (Signed)
PI OVERSIGHT ATTESTATION  I the Principal Investigator (PI) for the above mentioned study attest that I reviewed the mentioned  clinical research coordinator  notes on research subject  Erica [name]  born [birth year] . I  agree with the findings mentioned above  D/w patient - symptoms of tongue numbness and partial loss of tatese (dysgeusia) are very mild and tolerable. Definitely rlated to IP. She does swish and spit. Advised her to do 10 puff qid x 1 week -> then 11 puff qid x 1 week -> full dose 12 puff qid   D.w CRC Micheline Maze, MD Pulmonary and Critical Care Medicine Research Investigator & Staff Physician [facility] [place] [facility] [place] [facility] Pager: [phone], If no answer or between  15:00h - 7:00h: call [phone]  02/03/2022 12:39 PM

## 2022-02-04 DIAGNOSIS — M129 Arthropathy, unspecified: Secondary | ICD-10-CM | POA: Diagnosis not present

## 2022-02-04 DIAGNOSIS — Z79899 Other long term (current) drug therapy: Secondary | ICD-10-CM | POA: Diagnosis not present

## 2022-02-04 DIAGNOSIS — Z1159 Encounter for screening for other viral diseases: Secondary | ICD-10-CM | POA: Diagnosis not present

## 2022-02-04 DIAGNOSIS — Z1339 Encounter for screening examination for other mental health and behavioral disorders: Secondary | ICD-10-CM | POA: Diagnosis not present

## 2022-02-04 DIAGNOSIS — M79605 Pain in left leg: Secondary | ICD-10-CM | POA: Diagnosis not present

## 2022-02-04 DIAGNOSIS — I10 Essential (primary) hypertension: Secondary | ICD-10-CM | POA: Diagnosis not present

## 2022-02-04 DIAGNOSIS — Z9181 History of falling: Secondary | ICD-10-CM | POA: Diagnosis not present

## 2022-02-04 DIAGNOSIS — Z1331 Encounter for screening for depression: Secondary | ICD-10-CM | POA: Diagnosis not present

## 2022-02-04 DIAGNOSIS — Z6830 Body mass index (BMI) 30.0-30.9, adult: Secondary | ICD-10-CM | POA: Diagnosis not present

## 2022-02-04 DIAGNOSIS — R6889 Other general symptoms and signs: Secondary | ICD-10-CM | POA: Diagnosis not present

## 2022-02-04 DIAGNOSIS — E119 Type 2 diabetes mellitus without complications: Secondary | ICD-10-CM | POA: Diagnosis not present

## 2022-02-04 DIAGNOSIS — E669 Obesity, unspecified: Secondary | ICD-10-CM | POA: Diagnosis not present

## 2022-02-07 DIAGNOSIS — G5790 Unspecified mononeuropathy of unspecified lower limb: Secondary | ICD-10-CM | POA: Diagnosis not present

## 2022-02-07 DIAGNOSIS — N3946 Mixed incontinence: Secondary | ICD-10-CM | POA: Diagnosis not present

## 2022-02-07 DIAGNOSIS — E119 Type 2 diabetes mellitus without complications: Secondary | ICD-10-CM | POA: Diagnosis not present

## 2022-02-07 DIAGNOSIS — G894 Chronic pain syndrome: Secondary | ICD-10-CM | POA: Diagnosis not present

## 2022-02-07 DIAGNOSIS — I10 Essential (primary) hypertension: Secondary | ICD-10-CM | POA: Diagnosis not present

## 2022-02-07 DIAGNOSIS — E1321 Other specified diabetes mellitus with diabetic nephropathy: Secondary | ICD-10-CM | POA: Diagnosis not present

## 2022-02-08 DIAGNOSIS — R413 Other amnesia: Secondary | ICD-10-CM | POA: Diagnosis not present

## 2022-02-09 ENCOUNTER — Other Ambulatory Visit: Payer: Self-pay | Admitting: Internal Medicine

## 2022-02-09 DIAGNOSIS — R350 Frequency of micturition: Secondary | ICD-10-CM | POA: Diagnosis not present

## 2022-02-09 DIAGNOSIS — E1321 Other specified diabetes mellitus with diabetic nephropathy: Secondary | ICD-10-CM | POA: Diagnosis not present

## 2022-02-09 DIAGNOSIS — N390 Urinary tract infection, site not specified: Secondary | ICD-10-CM | POA: Diagnosis not present

## 2022-02-09 DIAGNOSIS — Z79899 Other long term (current) drug therapy: Secondary | ICD-10-CM | POA: Diagnosis not present

## 2022-02-10 LAB — URINE CULTURE
MICRO NUMBER:: 13853676
Result:: NO GROWTH
SPECIMEN QUALITY:: ADEQUATE

## 2022-02-11 DIAGNOSIS — E1140 Type 2 diabetes mellitus with diabetic neuropathy, unspecified: Secondary | ICD-10-CM | POA: Diagnosis not present

## 2022-02-22 ENCOUNTER — Other Ambulatory Visit: Payer: Self-pay | Admitting: Cardiology

## 2022-02-22 DIAGNOSIS — Z4509 Encounter for adjustment and management of other cardiac device: Secondary | ICD-10-CM | POA: Diagnosis not present

## 2022-02-22 DIAGNOSIS — Z95818 Presence of other cardiac implants and grafts: Secondary | ICD-10-CM | POA: Diagnosis not present

## 2022-02-22 DIAGNOSIS — E1122 Type 2 diabetes mellitus with diabetic chronic kidney disease: Secondary | ICD-10-CM

## 2022-02-22 DIAGNOSIS — I639 Cerebral infarction, unspecified: Secondary | ICD-10-CM | POA: Diagnosis not present

## 2022-02-22 DIAGNOSIS — N183 Chronic kidney disease, stage 3 unspecified: Secondary | ICD-10-CM

## 2022-02-22 DIAGNOSIS — R0602 Shortness of breath: Secondary | ICD-10-CM

## 2022-02-22 DIAGNOSIS — R413 Other amnesia: Secondary | ICD-10-CM | POA: Diagnosis not present

## 2022-02-24 ENCOUNTER — Telehealth: Payer: Self-pay | Admitting: *Deleted

## 2022-02-24 ENCOUNTER — Encounter: Payer: Medicare HMO | Admitting: *Deleted

## 2022-02-24 DIAGNOSIS — J84112 Idiopathic pulmonary fibrosis: Secondary | ICD-10-CM

## 2022-02-24 DIAGNOSIS — Z006 Encounter for examination for normal comparison and control in clinical research program: Secondary | ICD-10-CM

## 2022-02-24 DIAGNOSIS — R6889 Other general symptoms and signs: Secondary | ICD-10-CM | POA: Diagnosis not present

## 2022-02-24 NOTE — Telephone Encounter (Signed)
Personally reviewed and sent to medical records to be faxed to the patient's chart.

## 2022-02-24 NOTE — Telephone Encounter (Signed)
Received neuropsych evaluation notes from Dr Reynold Bowen, placed on NP's desk for review.

## 2022-03-01 DIAGNOSIS — Z6830 Body mass index (BMI) 30.0-30.9, adult: Secondary | ICD-10-CM | POA: Diagnosis not present

## 2022-03-01 DIAGNOSIS — Z0000 Encounter for general adult medical examination without abnormal findings: Secondary | ICD-10-CM | POA: Diagnosis not present

## 2022-03-01 DIAGNOSIS — Z79899 Other long term (current) drug therapy: Secondary | ICD-10-CM | POA: Diagnosis not present

## 2022-03-01 DIAGNOSIS — E119 Type 2 diabetes mellitus without complications: Secondary | ICD-10-CM | POA: Diagnosis not present

## 2022-03-01 DIAGNOSIS — Z23 Encounter for immunization: Secondary | ICD-10-CM | POA: Diagnosis not present

## 2022-03-01 DIAGNOSIS — M79605 Pain in left leg: Secondary | ICD-10-CM | POA: Diagnosis not present

## 2022-03-01 DIAGNOSIS — I10 Essential (primary) hypertension: Secondary | ICD-10-CM | POA: Diagnosis not present

## 2022-03-01 DIAGNOSIS — Z9181 History of falling: Secondary | ICD-10-CM | POA: Diagnosis not present

## 2022-03-01 DIAGNOSIS — R030 Elevated blood-pressure reading, without diagnosis of hypertension: Secondary | ICD-10-CM | POA: Diagnosis not present

## 2022-03-03 NOTE — Telephone Encounter (Signed)
Pt requesting a appt with Janett Billow, NP for brain damage related to stroke. Pt said psychiatrist instructed pt to see her neurologist.  Message nurse to check if need a referral. Nurse relayed pt need an referral if having issue of memory loss. Informed the patient to contact psychiatrist to send over a referral, we can schedule that appt.

## 2022-03-04 DIAGNOSIS — E1321 Other specified diabetes mellitus with diabetic nephropathy: Secondary | ICD-10-CM | POA: Diagnosis not present

## 2022-03-04 DIAGNOSIS — I10 Essential (primary) hypertension: Secondary | ICD-10-CM | POA: Diagnosis not present

## 2022-03-04 DIAGNOSIS — G5610 Other lesions of median nerve, unspecified upper limb: Secondary | ICD-10-CM | POA: Diagnosis not present

## 2022-03-13 DIAGNOSIS — E1140 Type 2 diabetes mellitus with diabetic neuropathy, unspecified: Secondary | ICD-10-CM | POA: Diagnosis not present

## 2022-03-24 DIAGNOSIS — N3946 Mixed incontinence: Secondary | ICD-10-CM | POA: Diagnosis not present

## 2022-03-24 DIAGNOSIS — E119 Type 2 diabetes mellitus without complications: Secondary | ICD-10-CM | POA: Diagnosis not present

## 2022-03-25 DIAGNOSIS — I639 Cerebral infarction, unspecified: Secondary | ICD-10-CM | POA: Diagnosis not present

## 2022-03-25 DIAGNOSIS — Z4509 Encounter for adjustment and management of other cardiac device: Secondary | ICD-10-CM | POA: Diagnosis not present

## 2022-03-25 DIAGNOSIS — Z95818 Presence of other cardiac implants and grafts: Secondary | ICD-10-CM | POA: Diagnosis not present

## 2022-03-27 ENCOUNTER — Other Ambulatory Visit: Payer: Self-pay | Admitting: Cardiology

## 2022-03-27 DIAGNOSIS — N183 Chronic kidney disease, stage 3 unspecified: Secondary | ICD-10-CM

## 2022-03-27 DIAGNOSIS — R0602 Shortness of breath: Secondary | ICD-10-CM

## 2022-03-27 DIAGNOSIS — E1122 Type 2 diabetes mellitus with diabetic chronic kidney disease: Secondary | ICD-10-CM

## 2022-03-30 DIAGNOSIS — Z6830 Body mass index (BMI) 30.0-30.9, adult: Secondary | ICD-10-CM | POA: Diagnosis not present

## 2022-03-30 DIAGNOSIS — Z79899 Other long term (current) drug therapy: Secondary | ICD-10-CM | POA: Diagnosis not present

## 2022-03-30 DIAGNOSIS — Z9181 History of falling: Secondary | ICD-10-CM | POA: Diagnosis not present

## 2022-03-30 DIAGNOSIS — M79605 Pain in left leg: Secondary | ICD-10-CM | POA: Diagnosis not present

## 2022-03-30 DIAGNOSIS — E119 Type 2 diabetes mellitus without complications: Secondary | ICD-10-CM | POA: Diagnosis not present

## 2022-03-31 ENCOUNTER — Encounter: Payer: Self-pay | Admitting: Cardiology

## 2022-03-31 ENCOUNTER — Ambulatory Visit: Payer: Medicare HMO | Admitting: Cardiology

## 2022-03-31 VITALS — BP 138/77 | HR 68 | Temp 97.0°F | Resp 16 | Ht 63.0 in | Wt 179.4 lb

## 2022-03-31 DIAGNOSIS — E1122 Type 2 diabetes mellitus with diabetic chronic kidney disease: Secondary | ICD-10-CM

## 2022-03-31 DIAGNOSIS — I129 Hypertensive chronic kidney disease with stage 1 through stage 4 chronic kidney disease, or unspecified chronic kidney disease: Secondary | ICD-10-CM

## 2022-03-31 DIAGNOSIS — I2510 Atherosclerotic heart disease of native coronary artery without angina pectoris: Secondary | ICD-10-CM | POA: Diagnosis not present

## 2022-03-31 DIAGNOSIS — Z95818 Presence of other cardiac implants and grafts: Secondary | ICD-10-CM

## 2022-03-31 DIAGNOSIS — E782 Mixed hyperlipidemia: Secondary | ICD-10-CM | POA: Diagnosis not present

## 2022-03-31 DIAGNOSIS — Z794 Long term (current) use of insulin: Secondary | ICD-10-CM

## 2022-03-31 DIAGNOSIS — E781 Pure hyperglyceridemia: Secondary | ICD-10-CM | POA: Diagnosis not present

## 2022-03-31 DIAGNOSIS — R0602 Shortness of breath: Secondary | ICD-10-CM

## 2022-03-31 DIAGNOSIS — Z8673 Personal history of transient ischemic attack (TIA), and cerebral infarction without residual deficits: Secondary | ICD-10-CM | POA: Diagnosis not present

## 2022-03-31 NOTE — Progress Notes (Signed)
Erica [name] Date of Birth: 1956-03-20 MRN: [id number] Primary Care Provider:Avbuere, Christean Grief, MD Former Cardiology Providers: Dr. Adrian Prows, Jeri Lager, APRN, FNP-C Primary Cardiologist: Rex Kras, DO, [facility] (established care 11/19/2019)  Date: 03/31/22 Last Office Visit: 09/24/2021  Chief Complaint  Patient presents with   Shortness of Breath   Follow-up    6 months    HPI   Erica [name] is a 65 y.o.  female whose past medical history and cardiovascular risk factors include: Hx of stroke involving the right caudate nucleus and right anterolateral thalamus (Jan 2022), Hx of  right parietooccipital stroke (May 2022), s/p loop recorder implant, rectal CA in 2014 that is in remission, T2DM, former tobacco use, emphysema, interstitial lung disease, idiopathic pulmonary fibrosis, and coronary artery calcification on CT scan.    She has had 2 strokes in 2022 and underwent cardiac monitor which was not diagnostic for the possibility of atrial fibrillation.  And therefore subsequently had a loop recorder implanted in July 2022.  She has not had any reoccurrence of strokes and follows up today for 22-monthvisit.  In the past she has had frequent complaints of being short of breath with which is secondary to uncontrolled hypertension in the past, diastolic dysfunction, and underlying pulmonary fibrosis.  From a [facility] standpoint [facility].  Since last office visit patient states that she is no longer experiencing shortness of breath at rest or with effort related activities.  She is no longer tired and fatigued all day long.  She is currently enrolled into a clinical trial with her pulmonologist which has significantly helped her shortness of breath/fibrosis/inflammation.  Usually she is accompanied by her sister and today she is here alone which is a significant improvement since establishing care.  She is ambulating with a  four-wheel walker.  No hospitalizations for chest pain or heart failure since last office visit.  ALLERGIES: Allergies  Allergen Reactions   Penicillins Hives, Itching and Swelling    Tongue swells up Has patient had a PCN reaction causing immediate rash, facial/tongue/throat swelling, SOB or lightheadedness with hypotension: Yes Has patient had a PCN reaction causing severe rash involving mucus membranes or skin necrosis: Yes Has patient had a PCN reaction that required hospitalization Yes Has patient had a PCN reaction occurring within the last 10 years: No If all of the above answers are "NO", then may proceed with Cephalosporin use.      MEDICATION LIST PRIOR TO VISIT: Current Outpatient Medications on File Prior to Visit  Medication Sig Dispense Refill   acetaminophen (TYLENOL) 325 MG tablet Take 2 tablets (650 mg total) by mouth every 4 (four) hours as needed for mild pain (or temp > 37.5 C (99.5 F)).     albuterol (VENTOLIN HFA) 108 (90 Base) MCG/ACT inhaler Inhale 2 puffs into the lungs every 4 (four) hours as needed for wheezing. 1 g 5   amLODipine (NORVASC) 10 MG tablet Take 1 tablet (10 mg total) by mouth daily. 30 tablet 2   aspirin EC 81 MG EC tablet Take 1 tablet (81 mg total) by mouth daily. Swallow whole. 30 tablet 11   ENTRESTO 49-51 MG TAKE 1 TABLET BY MOUTH TWICE DAILY 60 tablet 0   famotidine (PEPCID) 20 MG tablet Take 1 tablet (20 mg total) by mouth daily. 30 tablet 0   fenofibrate (TRICOR) 145 MG tablet TAKE 1 TABLET(145 MG) BY MOUTH DAILY (Patient taking differently: Take 145  mg by mouth daily.) 90 tablet 3   gabapentin (NEURONTIN) 100 MG capsule Take 2 capsules (200 mg total) by mouth 2 (two) times daily as needed (pain). (Patient taking differently: Take 100 mg by mouth 2 (two) times daily.) 60 capsule 0   hydrALAZINE (APRESOLINE) 25 MG tablet Take 25 mg by mouth in the morning and at bedtime.     insulin glargine (LANTUS SOLOSTAR) 100 UNIT/ML Solostar Pen  Please provide 24 units every a.m. and 20 units every p.m. (Patient taking differently: Inject 18-24 Units into the skin See admin instructions. 24 units in the morning 18 units at bedtime) 15 mL 11   Insulin Pen Needle (PEN NEEDLES) 32G X 6 MM MISC 1 application by Does not apply route 2 (two) times daily. 100 each 1   Multiple Vitamin (MULTIVITAMIN ADULT PO) Take 1 tablet by mouth daily.     oxyCODONE (ROXICODONE) 5 MG immediate release tablet Take 1 tablet (5 mg total) by mouth every 4 (four) hours as needed for severe pain. 9 tablet 0   pantoprazole (PROTONIX) 40 MG tablet Take 40 mg by mouth daily.     Pirfenidone (ESBRIET) 801 MG TABS Take 801 mg by mouth with breakfast, with lunch, and with evening meal. (Patient taking differently: Take 801 mg by mouth in the morning and at bedtime.) 270 tablet 1   rosuvastatin (CRESTOR) 20 MG tablet TAKE 1 TABLET(20 MG) BY MOUTH DAILY (Patient taking differently: Take 20 mg by mouth daily.) 90 tablet 0   sertraline (ZOLOFT) 50 MG tablet Take 50 mg by mouth daily.     SYMBICORT 160-4.5 MCG/ACT inhaler INHALE 2 PUFFS INTO THE LUNGS TWICE DAILY 10.2 g 5   Tiotropium Bromide Monohydrate (SPIRIVA RESPIMAT) 1.25 MCG/ACT AERS Inhale 2 puffs into the lungs daily. 4 g 5   venlafaxine XR (EFFEXOR-XR) 150 MG 24 hr capsule Take 150 mg by mouth daily.     No current facility-administered medications on file prior to visit.    PAST MEDICAL HISTORY: Past Medical History:  Diagnosis Date   Arthritis    especially in shoulders   Asthma    Depression    Diabetes mellitus    Embolic stroke ([place]) 1/85/6314   GERD (gastroesophageal reflux disease)    Headache(784.0)    "mild"   Hypertension    Loop Biotronik loop implant 12/16/2020 12/16/2020   Mental disorder    depression   Neuropathy    feet    Pain    arthritis pain - takes tramadol as needed   Rectal polyp    very little bleeding with bowel movements- no pain   Stroke ([place]) 11/03/2020   Suicide attempt  ([place])     PAST SURGICAL HISTORY: Past Surgical History:  Procedure Laterality Date   ANAL RECTAL MANOMETRY N/A 07/13/2016   Procedure: ANO RECTAL MANOMETRY;  Surgeon: Leighton Ruff, MD;  Location: [facility];  Service: Endoscopy;  Laterality: N/A;   CESAREAN SECTION     EUS N/A 11/21/2012   Procedure: LOWER ENDOSCOPIC ULTRASOUND (EUS);  Surgeon: Arta Silence, MD;  Location: [facility];  Service: Endoscopy;  Laterality: N/A;   FLEXIBLE SIGMOIDOSCOPY N/A 11/21/2012   Procedure: FLEXIBLE SIGMOIDOSCOPY;  Surgeon: Arta Silence, MD;  Location: [facility];  Service: Endoscopy;  Laterality: N/A;   FLEXIBLE SIGMOIDOSCOPY N/A 01/28/2013   Procedure: FLEXIBLE SIGMOIDOSCOPY;  Surgeon: Leighton Ruff, MD;  Location: [facility];  Service: Endoscopy;  Laterality: N/A;   LAPAROSCOPIC LOW ANTERIOR RESECTION N/A 01/29/2013   Procedure: LAPAROSCOPIC LOW  ANTERIOR RESECTION, Rigid Proctoscopy;  Surgeon: Leighton Ruff, MD;  Location: [facility];  Service: General;  Laterality: N/A;   LAPAROSCOPIC SIGMOID COLECTOMY N/A 11/14/2012   Procedure: DIAGNOSTIC LAPAROSCOPY AND SIGMOIDMOIDOSCOPY ;  Surgeon: Rolm Bookbinder, MD;  Location: [facility];  Service: General;  Laterality: N/A;   RECTAL ULTRASOUND N/A 07/13/2016   Procedure: RECTAL ULTRASOUND;  Surgeon: Leighton Ruff, MD;  Location: [facility];  Service: Endoscopy;  Laterality: N/A;   TONSILLECTOMY     TONSILLECTOMY AND ADENOIDECTOMY      FAMILY HISTORY: The patient's family history includes Cancer in her maternal aunt, maternal uncle, maternal uncle, maternal uncle, paternal aunt, and paternal uncle; Cancer (age of onset: 35) in an other family member; Diabetes in her father; Heart disease in her mother; Hyperlipidemia in her mother; Hypertension in her brother, brother, father, mother, sister, and sister; Stroke in her father.   SOCIAL HISTORY:  The patient  reports that she quit smoking about 4 years ago. Her smoking use included cigarettes. She has a 10.00  pack-year smoking history. She has never used smokeless tobacco. She reports that she does not currently use alcohol. She reports that she does not currently use drugs after having used the following drugs: "Crack" cocaine.  Review of Systems  Constitutional: Negative for malaise/fatigue.  Cardiovascular:  Negative for chest pain, dyspnea on exertion, leg swelling, near-syncope, orthopnea, palpitations, paroxysmal nocturnal dyspnea and syncope.  Respiratory:  Negative for shortness of breath.     PHYSICAL EXAM:    03/31/2022   10:20 AM 03/31/2022   10:16 AM 01/08/2022    8:26 PM  Vitals with BMI  Height  _0    Weight  179 lbs 6 oz   BMI  91.47   Systolic 829 562 130  Diastolic 77 71 84  Pulse 68 67 68    CONSTITUTIONAL: Appears older than stated age.  Hemodynamically stable.  well-nourished. No acute distress.  Ambulates with 4 wheel walker SKIN: Skin is warm and dry. No rash noted. No cyanosis. No pallor. No jaundice HEAD: Normocephalic and atraumatic.  EYES: No scleral icterus MOUTH/THROAT: Moist oral membranes.  NECK: No JVD present. No thyromegaly noted. No carotid bruits  CHEST Normal respiratory effort. No intercostal retractions  LUNGS: Clear to auscultation in the upper lung fields with minimal dry crackles at the bilateral bases.  CARDIOVASCULAR: Regular rate and rhythm, positive S1-S2, no murmurs rubs or gallops appreciated. ABDOMINAL: Obese, soft, nontender, nondistended, positive bowel sounds all 4 quadrants. No apparent ascites.  EXTREMITIES: No peripheral edema. Left ankle brace. HEMATOLOGIC: No significant bruising NEUROLOGIC: Oriented to person, place, and time.  5 out of 5 strength in the right upper and lower extremity.  4 out of 5 strength in the left upper and lower extremity. PSYCHIATRIC: Normal mood and affect. Normal behavior. Cooperative  RADIOLOGY: CT of chest 04/12/2019:  1. The appearance of the lungs remains compatible with interstitial lung  disease, with a spectrum of findings considered diagnostic of usual interstitial pneumonia (UIP) per current ATS guidelines. Minimal progression compared to the prior study. 2. Aortic atherosclerosis, in addition to left anterior descending coronary artery disease.  3. Cholelithiasis.  Aortic Atherosclerosis (ICD10-I70.0).  CT head without contrast 06/25/2020: 1. Lacunar infarcts in the right caudate and right cerebellum, new from 2012, but age indeterminate. 2. No hemorrhage or evidence of large or territorial ischemia. 3. Chronic left sphenoid sinusitis.  MRI brain without contrast 06/25/2020: 1. 1 cm acute infarction in the anterolateral thalamus on the right, possibly with  some involvement of the posterior limb internal capsule. Acute infarction within the right caudate head. Scattered other foci of high signal on diffusion imaging within the hemispheric white matter, not showing restricted diffusion on the ADC map, and therefore probably representing T2 shine through from old white matter infarctions. 2. Extensive old small vessel infarctions throughout the brain as outlined above. Small old cortical infarctions in the right frontal lobe and both parieto-occipital regions. 3. No large or medium vessel occlusion or correctable proximal stenosis. Mild atherosclerotic irregularity of the PCA branches.  MR cervical spine without contrast 06/26/2020 1. 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 Mild generalized atrophy of the upper cervical spinal cord. 2. Moderate C6-7 and mild C5-6 and C7-T1 spinal canal stenosis secondary to central disc protrusions.   MR Brain Wo Contrast (neuro protocol) Result Date: 11/03/2020 IMPRESSION: Newly seen punctate acute infarction at the medial right parietooccipital junction. No other change since the most recent exam of January. Chronic small-vessel ischemic changes of the pons, cerebellum, thalami, basal ganglia and  hemispheric white matter. Old cortical and subcortical infarctions in the occipital regions and frontoparietal regions.  CARDIAC DATABASE: EKG: 03/31/2022: Normal sinus rhythm, 67 bpm, LAE, without underlying ischemia injury pattern.  Echocardiogram: 11/04/2020:  LVEF 60-65%, moderate LVH, grade 1 diastolic impairment, elevated LAP, normal right ventricular size and function, mild AR.  08/30/2021: Left ventricle cavity is normal in size. Moderate concentric hypertrophy of the left ventricle. Normal LV systolic function with EF 56%. Normal global wall motion. Doppler evidence of grade I (impaired) diastolic dysfunction, normal LAP.  Left atrial cavity is mildly dilated. Trileaflet aortic valve. Mild aortic valve leaflet calcification. Moderate (Grade II) aortic regurgitation. Trace pericardial effusion. No significant change compared to previous study on 07/16/2019.   Stress Testing:  Lexiscan tetrofosmin stress test 07/22/2019: There is  apical thinning an mild soft tissue attenuation artifact in inferior wall without ischemia or infarct. All segments of left ventricle demonstrated normal wall motion and thickening. Stress LV EF is moderately dysfunctional 39%, however visually appearted to be normal. No previous exam available for comparison. Low risk study.   Carotid Duplex 11/05/2020: Right Carotid: The extracranial vessels were near-normal with only minimal wall thickening or plaque.  Left Carotid: The extracranial vessels were near-normal with only minimal wall thickening or plaque.  Vertebrals: Bilateral vertebral arteries demonstrate antegrade flow.   Heart Catheterization: None  14 day extended Holter monitor: Dominant rhythm normal sinus rhythm Heart rate 44-179 bpm.  Avg HR 69 bpm. No atrial fibrillation, high grade AV block, pauses (3 seconds or longer). 1 episode of nonsustained ventricular tachycardia, 13 beats in duration, max heart rate of 179 bpm. 1 episode of  supraventricular tachycardia, 6 beats in duration, maximum heart rate 164bpm. Total ventricular ectopic burden <1%. Total supraventricular ectopic burden <1%. Patient triggered events: 0.  Biotronik loop recorder implantation 12/16/2020  Remote loop recorder transmission 03/25/2021: No symptoms reported.  Frequent atrial monitoring episodes = normal sinus rhythm, PACs.  Rare PVCs. No symptoms reported.  LABORATORY DATA:    Latest Ref Rng & Units 01/08/2022    3:00 PM 11/22/2021    6:30 PM 11/22/2021    6:06 PM  CBC  WBC 4.0 - 10.5 K/uL 3.9   4.0   Hemoglobin 12.0 - 15.0 g/dL 11.3  10.2  10.7   Hematocrit 36.0 - 46.0 % 31.7  30.0  30.1   Platelets 150 - 400 K/uL 185   198  Latest Ref Rng & Units 01/12/2022   12:18 PM 01/08/2022    3:00 PM 11/22/2021    6:30 PM  CMP  Glucose 65 - 99 mg/dL 165  120  84   BUN 7 - 25 mg/dL 32  23  26   Creatinine 0.50 - 1.05 mg/dL 1.90  1.69  2.20   Sodium 135 - 146 mmol/L 140  141  143   Potassium 3.5 - 5.3 mmol/L 4.7  4.1  4.3   Chloride 98 - 110 mmol/L 110  111  109   CO2 20 - 32 mmol/L 23  23    Calcium 8.6 - 10.4 mg/dL 9.3  9.6      Lipid Panel  Lab Results  Component Value Date   CHOL 128 07/16/2021   HDL 82 07/16/2021   LDLCALC 31 07/16/2021   LDLDIRECT 37 03/08/2021   TRIG 71 07/16/2021   CHOLHDL 1.6 07/16/2021    Lab Results  Component Value Date   HGBA1C 6.3 (H) 11/04/2020   HGBA1C 5.2 06/26/2020   HGBA1C 7.9 (H) 05/11/2019   No components found for: "NTPROBNP" Lab Results  Component Value Date   TSH 2.01 07/16/2021   TSH 3.642 06/26/2020   TSH 3.53 06/25/2020    Cardiac Panel (last 3 results) No results for input(s): "CKTOTAL", "CKMB", "TROPONINIHS", "RELINDX" in the last 72 hours.  IMPRESSION:    ICD-10-CM   1. Shortness of breath  R06.02 EKG 12-Lead    2. Coronary atherosclerosis due to calcified coronary lesion of native artery  I25.10    I25.84     3. Hx of stroke involving the right caudate nucleus  and right anterolateral thalamus (Jan 2022), Hx of  right parietooccipital stroke (May 2022)  Z86.73     4. Hypertriglyceridemia  E78.1     5. Benign hypertension with CKD (chronic kidney disease) stage III (HCC)  I12.9    N18.30     6. Type 2 diabetes mellitus with stage 3b chronic kidney disease, with long-term current use of insulin (HCC)  E11.22    N18.32    Z79.4     7. Long-term insulin use (HCC)  Z79.4     8. Mixed hyperlipidemia  E78.2     9. Status post placement of implantable loop recorder  Z95.818        RECOMMENDATIONS: Erica [name] is a 66 y.o. female whose past medical history and cardiovascular risk factors include: Recent stroke involving the right caudate nucleus and right anterolateral thalamus (March 2022), rectal CA in 2014 that is in remission, T2DM, former tobacco use, emphysema, interstitial lung disease, idiopathic pulmonary fibrosis, and coronary artery calcification on CT scan.    Shortness of breath Significantly improved since last office visit. Blood pressure: Well-controlled on current medical therapy. Diastolic dysfunction: Has done well on Entresto. Pulmonary fibrosis: Likely in a clinical trial based on how she is describing the new breathing treatment she is receiving at her pulmonologist office. Echocardiogram: Preserved LVEF, grade 1 diastolic impairment, aortic regurgitation. Loop recorder implant: No episodes of A-fib.  Underlying rhythm sinus with PACs.  Coronary atherosclerosis due to calcified coronary lesion of native artery Continue antiplatelet therapy and Crestor.  Hx of stroke involving the right caudate nucleus and right anterolateral thalamus (Jan 2022), Hx of  right parietooccipital stroke (May 2022) Last CVA May 2022. Reemphasized importance of improving her modifiable cardiovascular risk factors including glycemic control, blood pressure, lipid management  Hypertriglyceridemia Last lipid profile from February 2023 illustrates  well-controlled triglyceride levels. Continue fenofibrate  Benign hypertension with CKD (chronic kidney disease) stage IIIb ([place]) Office blood pressures are well controlled. Medications reconciled to the best of her memory. We emphasized the importance of bringing her medication bottles and for review (can keep pain medications at home). Reemphasized the importance of low-salt diet.  Type 2 diabetes mellitus with stage 3b chronic kidney disease, with long-term current use of insulin ([place]) Educated her on importance of glycemic control. I have discussed Farxiga/Jardiance in the past; however, she has had episodes of at least 3 UTIs and patient appears to be hesitant.  Mixed hyperlipidemia Currently on Crestor.   She denies myalgia or other side effects. Most recent lipids dated February 2023, independently reviewed as noted above.  Status post placement of implantable loop recorder Loop recorder implant site is clean dry and intact. Last remote transmission from October 2023 reviewed-underlying rhythm sinus without episodes of A-fib.  Frequent PACs.   FINAL MEDICATION LIST END OF ENCOUNTER:  No orders of the defined types were placed in this encounter.     Current Outpatient Medications:    acetaminophen (TYLENOL) 325 MG tablet, Take 2 tablets (650 mg total) by mouth every 4 (four) hours as needed for mild pain (or temp > 37.5 C (99.5 F))., Disp: , Rfl:    albuterol (VENTOLIN HFA) 108 (90 Base) MCG/ACT inhaler, Inhale 2 puffs into the lungs every 4 (four) hours as needed for wheezing., Disp: 1 g, Rfl: 5   amLODipine (NORVASC) 10 MG tablet, Take 1 tablet (10 mg total) by mouth daily., Disp: 30 tablet, Rfl: 2   aspirin EC 81 MG EC tablet, Take 1 tablet (81 mg total) by mouth daily. Swallow whole., Disp: 30 tablet, Rfl: 11   ENTRESTO 49-51 MG, TAKE 1 TABLET BY MOUTH TWICE DAILY, Disp: 60 tablet, Rfl: 0   famotidine (PEPCID) 20 MG tablet, Take 1 tablet (20 mg total) by mouth daily.,  Disp: 30 tablet, Rfl: 0   fenofibrate (TRICOR) 145 MG tablet, TAKE 1 TABLET(145 MG) BY MOUTH DAILY (Patient taking differently: Take 145 mg by mouth daily.), Disp: 90 tablet, Rfl: 3   gabapentin (NEURONTIN) 100 MG capsule, Take 2 capsules (200 mg total) by mouth 2 (two) times daily as needed (pain). (Patient taking differently: Take 100 mg by mouth 2 (two) times daily.), Disp: 60 capsule, Rfl: 0   hydrALAZINE (APRESOLINE) 25 MG tablet, Take 25 mg by mouth in the morning and at bedtime., Disp: , Rfl:    insulin glargine (LANTUS SOLOSTAR) 100 UNIT/ML Solostar Pen, Please provide 24 units every a.m. and 20 units every p.m. (Patient taking differently: Inject 18-24 Units into the skin See admin instructions. 24 units in the morning 18 units at bedtime), Disp: 15 mL, Rfl: 11   Insulin Pen Needle (PEN NEEDLES) 32G X 6 MM MISC, 1 application by Does not apply route 2 (two) times daily., Disp: 100 each, Rfl: 1   Multiple Vitamin (MULTIVITAMIN ADULT PO), Take 1 tablet by mouth daily., Disp: , Rfl:    oxyCODONE (ROXICODONE) 5 MG immediate release tablet, Take 1 tablet (5 mg total) by mouth every 4 (four) hours as needed for severe pain., Disp: 9 tablet, Rfl: 0   pantoprazole (PROTONIX) 40 MG tablet, Take 40 mg by mouth daily., Disp: , Rfl:    Pirfenidone (ESBRIET) 801 MG TABS, Take 801 mg by mouth with breakfast, with lunch, and with evening meal. (Patient taking differently: Take 801 mg by mouth in the morning and at bedtime.),  Disp: 270 tablet, Rfl: 1   rosuvastatin (CRESTOR) 20 MG tablet, TAKE 1 TABLET(20 MG) BY MOUTH DAILY (Patient taking differently: Take 20 mg by mouth daily.), Disp: 90 tablet, Rfl: 0   sertraline (ZOLOFT) 50 MG tablet, Take 50 mg by mouth daily., Disp: , Rfl:    SYMBICORT 160-4.5 MCG/ACT inhaler, INHALE 2 PUFFS INTO THE LUNGS TWICE DAILY, Disp: 10.2 g, Rfl: 5   Tiotropium Bromide Monohydrate (SPIRIVA RESPIMAT) 1.25 MCG/ACT AERS, Inhale 2 puffs into the lungs daily., Disp: 4 g, Rfl: 5    venlafaxine XR (EFFEXOR-XR) 150 MG 24 hr capsule, Take 150 mg by mouth daily., Disp: , Rfl:   Orders Placed This Encounter  Procedures   EKG 12-Lead    --Continue cardiac medications as reconciled in final medication list. --Return in about 6 months (around 09/30/2022) for Follow up, Dyspnea. Or sooner if needed. --Continue follow-up with your primary care physician regarding the management of your other chronic comorbid conditions.  Patient's questions and concerns were addressed to her satisfaction. She voices understanding of the instructions provided during this encounter.   This note was created using a voice recognition software as a result there may be grammatical errors inadvertently enclosed that do not reflect the nature of this encounter. Every attempt is made to correct such errors.  Rex Kras, [place], [facility]  Pager: [phone] Office: [phone]

## 2022-04-04 DIAGNOSIS — Z79899 Other long term (current) drug therapy: Secondary | ICD-10-CM | POA: Diagnosis not present

## 2022-04-08 ENCOUNTER — Encounter: Payer: Medicare HMO | Attending: Physical Medicine & Rehabilitation | Admitting: Physical Medicine & Rehabilitation

## 2022-04-08 ENCOUNTER — Encounter: Payer: Self-pay | Admitting: Physical Medicine & Rehabilitation

## 2022-04-08 VITALS — BP 136/82 | HR 66 | Ht 63.0 in | Wt 180.0 lb

## 2022-04-08 DIAGNOSIS — R269 Unspecified abnormalities of gait and mobility: Secondary | ICD-10-CM | POA: Insufficient documentation

## 2022-04-08 DIAGNOSIS — I69398 Other sequelae of cerebral infarction: Secondary | ICD-10-CM | POA: Insufficient documentation

## 2022-04-08 NOTE — Progress Notes (Signed)
Subjective:    Patient ID: Erica [name], female    DOB: 10/10/1955, 66 y.o.   MRN: [id number] [place] y.o. 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  Per chart review lives with her children.  Independent with assistive device.  1 level home with level entry.  Good supportive family.  Presented 11/03/2020 with persistent headache and left-sided weakness x3 days.  MRI showed punctate acute infarction at the medial right parieto-occipital junction.  Chronic small vessel ischemic changes.  MRA of the head negative for large vessel occlusion.  MRI cervical spine with persistent patchy signal abnormality involving the central dorsal aspect of the cervical spinal cord extending from C2-C7-T1 relatively stable and unchanged from previous tracings.  Admission chemistries unremarkable except chloride 113 creatinine 1.70 hemoglobin 9.2 hemoglobin A1c 6.3.  Echocardiogram with ejection fraction of 60 to 19% grade 1 diastolic dysfunction.  Maintained on aspirin and Plavix for CVA prophylaxis x3 weeks and aspirin alone.    HPI 66 year old female history of prior strokes has left-sided lower extremity weakness is residual.  She has left mild foot drop requiring foot up orthosis. She has had no falls or new medical complications.  Her left hip pain is moderate in nature mainly with walking. The patient did have an ED visit for left lower extremity pain approximately 3 months ago x-rays of the left hip showed minimal degenerative changes, duplex ultrasound of the lower extremity showed no evidence of DVT bilaterally. The patient has had improvement in her symptomatology since that time overall.  She did receive some narcotic analgesics on a short-term basis  Pain Inventory Average Pain 6 Pain Right Now  6 My pain is tingling  In the last 24 hours, has pain interfered with the following? General activity 4 Relation with others 4 Enjoyment of life 4 What TIME of day is your pain at its worst? night Sleep (in general) Poor  Pain is worse with: walking Pain improves with: rest Relief from Meds: 6  Family History  Problem Relation Age of Onset   Hypertension Father    Stroke Father    Diabetes Father    Heart disease Mother    Hypertension Mother    Hyperlipidemia Mother    Hypertension Sister    Hypertension Brother    Hypertension Sister    Hypertension Brother    Cancer Other 26       colon cancer    Cancer Maternal Aunt        cancer, unknown type    Cancer Maternal Uncle        bone cancer    Cancer Paternal Aunt        lung cancer   Cancer Paternal Uncle        lung cancer   Cancer Maternal Uncle        colon cancer and prostate cancer    Cancer Maternal Uncle        prostate cancer    Social History   Socioeconomic History   Marital status: Single    Spouse name: Not on file   Number of children: 1   Years of education: Not on file   Highest education level: Not on file  Occupational History   Not on file  Tobacco Use   Smoking status: Former    Packs/day: 0.50  Years: 20.00    Total pack years: 10.00    Types: Cigarettes    Quit date: 01/05/2018    Years since quitting: 4.2   Smokeless tobacco: Never   Tobacco comments:    Unknown   Vaping Use   Vaping Use: Never used  Substance and Sexual Activity   Alcohol use: Not Currently   Drug use: Not Currently    Types: "Crack" cocaine    Comment: Unknown    Sexual activity: Never    Birth control/protection: None  Other Topics Concern   Not on file  Social History Narrative   12/30/20 lives with others   Social Determinants of Health   Financial Resource Strain: Not on file  Food Insecurity: Not on file  Transportation Needs: Not on file  Physical Activity: Not on file  Stress: Not on  file  Social Connections: Not on file   Past Surgical History:  Procedure Laterality Date   ANAL RECTAL MANOMETRY N/A 07/13/2016   Procedure: ANO RECTAL MANOMETRY;  Surgeon: Leighton Ruff, MD;  Location: [facility];  Service: Endoscopy;  Laterality: N/A;   CESAREAN SECTION     EUS N/A 11/21/2012   Procedure: LOWER ENDOSCOPIC ULTRASOUND (EUS);  Surgeon: Arta Silence, MD;  Location: [facility];  Service: Endoscopy;  Laterality: N/A;   FLEXIBLE SIGMOIDOSCOPY N/A 11/21/2012   Procedure: FLEXIBLE SIGMOIDOSCOPY;  Surgeon: Arta Silence, MD;  Location: [facility];  Service: Endoscopy;  Laterality: N/A;   FLEXIBLE SIGMOIDOSCOPY N/A 01/28/2013   Procedure: FLEXIBLE SIGMOIDOSCOPY;  Surgeon: Leighton Ruff, MD;  Location: [facility];  Service: Endoscopy;  Laterality: N/A;   LAPAROSCOPIC LOW ANTERIOR RESECTION N/A 01/29/2013   Procedure: LAPAROSCOPIC LOW ANTERIOR RESECTION, Rigid Proctoscopy;  Surgeon: Leighton Ruff, MD;  Location: [facility];  Service: General;  Laterality: N/A;   LAPAROSCOPIC SIGMOID COLECTOMY N/A 11/14/2012   Procedure: DIAGNOSTIC LAPAROSCOPY AND SIGMOIDMOIDOSCOPY ;  Surgeon: Rolm Bookbinder, MD;  Location: [facility];  Service: General;  Laterality: N/A;   RECTAL ULTRASOUND N/A 07/13/2016   Procedure: RECTAL ULTRASOUND;  Surgeon: Leighton Ruff, MD;  Location: [facility];  Service: Endoscopy;  Laterality: N/A;   TONSILLECTOMY     TONSILLECTOMY AND ADENOIDECTOMY     Past Surgical History:  Procedure Laterality Date   ANAL RECTAL MANOMETRY N/A 07/13/2016   Procedure: ANO RECTAL MANOMETRY;  Surgeon: Leighton Ruff, MD;  Location: [facility];  Service: Endoscopy;  Laterality: N/A;   CESAREAN SECTION     EUS N/A 11/21/2012   Procedure: LOWER ENDOSCOPIC ULTRASOUND (EUS);  Surgeon: Arta Silence, MD;  Location: [facility];  Service: Endoscopy;  Laterality: N/A;   FLEXIBLE SIGMOIDOSCOPY N/A 11/21/2012   Procedure: FLEXIBLE SIGMOIDOSCOPY;  Surgeon: Arta Silence, MD;  Location: [facility];  Service: Endoscopy;  Laterality: N/A;   FLEXIBLE SIGMOIDOSCOPY N/A 01/28/2013   Procedure: FLEXIBLE SIGMOIDOSCOPY;  Surgeon: Leighton Ruff, MD;  Location: [facility];  Service: Endoscopy;  Laterality: N/A;   LAPAROSCOPIC LOW ANTERIOR RESECTION N/A 01/29/2013   Procedure: LAPAROSCOPIC LOW ANTERIOR RESECTION, Rigid Proctoscopy;  Surgeon: Leighton Ruff, MD;  Location: [facility];  Service: General;  Laterality: N/A;   LAPAROSCOPIC SIGMOID COLECTOMY N/A 11/14/2012   Procedure: DIAGNOSTIC LAPAROSCOPY AND SIGMOIDMOIDOSCOPY ;  Surgeon: Rolm Bookbinder, MD;  Location: [facility];  Service: General;  Laterality: N/A;   RECTAL ULTRASOUND N/A 07/13/2016   Procedure: RECTAL ULTRASOUND;  Surgeon: Leighton Ruff, MD;  Location: [facility];  Service: Endoscopy;  Laterality: N/A;   TONSILLECTOMY     TONSILLECTOMY AND ADENOIDECTOMY  Past Medical History:  Diagnosis Date   Arthritis    especially in shoulders   Asthma    Depression    Diabetes mellitus    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   GERD (gastroesophageal reflux disease)    Headache(784.0)    "mild"   Hypertension    Loop Biotronik loop implant 12/16/2020 12/16/2020   Mental disorder    depression   Neuropathy    feet    Pain    arthritis pain - takes tramadol as needed   Rectal polyp    very little bleeding with bowel movements- no pain   Stroke ([place]) 11/03/2020   Suicide attempt ([place])    BP 136/82   Pulse 66   Ht _0  (1.6 m)   Wt 180 lb (81.6 kg)   SpO2 98%   BMI 31.89 kg/m   Opioid Risk Score:   Fall Risk Score:  `1  Depression screen [facility] 2/9     04/08/2022   10:57 AM 11/26/2021    2:15 PM 04/20/2021    2:17 PM 12/11/2020    3:28 PM 12/03/2020    9:41 AM 07/29/2020    8:39 AM  Depression screen PHQ 2/9  Decreased Interest _1 0 3  Down, Depressed, Hopeless _2 PHQ - 2 Score _3 Altered sleeping     1 3  Tired, decreased energy     3 3  Change in appetite     2 3  Feeling bad or failure about  yourself      0 3  Trouble concentrating     3 3  Moving slowly or fidgety/restless     1 3  Suicidal thoughts     0 1  PHQ-9 Score     11 25  Difficult doing work/chores     Very difficult       Review of Systems  Musculoskeletal:        Left anterior leg pain  All other systems reviewed and are negative.      Objective:   Physical Exam Vitals and nursing note reviewed.  Constitutional:      Appearance: She is obese.  HENT:     Head: Normocephalic and atraumatic.  Eyes:     Extraocular Movements: Extraocular movements intact.     Conjunctiva/sclera: Conjunctivae normal.     Pupils: Pupils are equal, round, and reactive to light.  Musculoskeletal:        General: No swelling or tenderness.     Comments: There is edema in the left lower extremity proximal and distal to the supra malleoli or band of her foot up orthosis.  Nontender to palpation no evidence of erythema or weeping.  No pain with ankle range of motion.  Skin:    General: Skin is warm and dry.  Neurological:     Mental Status: She is alert and oriented to person, place, and time.     Comments: Motor strength is 5/5 bilateral deltoid, bicep, tricep, grip 5/5 in the right hip flexor knee extensor ankle dorsiflexion 4/5 in the left hip flexor knee extensor and 3 - in the ankle dorsiflexor on the left side. She ambulates with a walker and foot up orthosis she does not drag her foot.  Psychiatric:        Mood and Affect: Mood normal.        Behavior: Behavior normal.  Assessment & Plan:   1.  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  No progressive symptoms.  Continue with foot up orthosis.  Lower extremity swelling appears to be related to the supra malleoli or band of her orthosis.  No signs of infection no tenderness. Continue with home exercise program Follow-up with physical medicine rehab in 6 months Patient has follow-up  appointments with neurology, PCP as well as podiatry She is not complaining as much with the thalamic stroke related pain left lower extremity and takes low-dose gabapentin 100 mg twice daily

## 2022-04-08 NOTE — Patient Instructions (Signed)
Left leg swelling likely due to weakness and the effect of the Left ankle brace

## 2022-04-11 ENCOUNTER — Telehealth: Payer: Self-pay | Admitting: Internal Medicine

## 2022-04-11 ENCOUNTER — Encounter: Payer: Self-pay | Admitting: Podiatry

## 2022-04-11 ENCOUNTER — Ambulatory Visit (INDEPENDENT_AMBULATORY_CARE_PROVIDER_SITE_OTHER): Payer: Medicare HMO | Admitting: Podiatry

## 2022-04-11 DIAGNOSIS — Z5181 Encounter for therapeutic drug level monitoring: Secondary | ICD-10-CM

## 2022-04-11 DIAGNOSIS — L600 Ingrowing nail: Secondary | ICD-10-CM

## 2022-04-11 DIAGNOSIS — J84112 Idiopathic pulmonary fibrosis: Secondary | ICD-10-CM

## 2022-04-11 DIAGNOSIS — Z79899 Other long term (current) drug therapy: Secondary | ICD-10-CM

## 2022-04-11 NOTE — Telephone Encounter (Signed)
Refill pended for PIRFENIDONE to [facility]: [phone]. Patient is due for updated labs. Future order palced today. Patient advised of lab hours.  Dose: 801 mg three times daily  Last OV: 10/07/21, seen by [facility] on 01/27/22 Provider: Dr. Chase Caller  Next OV: 04/21/22  Knox Saliva, PharmD, MPH, BCPS Clinical Pharmacist (Rheumatology and Pulmonology)

## 2022-04-11 NOTE — Patient Instructions (Signed)

## 2022-04-11 NOTE — Progress Notes (Signed)
Subjective:   Patient ID: Erica [name], female   DOB: 66 y.o.   MRN: [id number]   HPI Presents with severe pain in the big toenails bilateral stating she has been trying to trim them and cut them herself and states her sugars been under good control.  States the left bothers her more than the right but they both get sore and she is known she is needed to have them removed   ROS      Objective:  Physical Exam  Neuro vascular status intact with patient found to have thickened deformed nailbeds left over right dystrophic painful on the dorsal surface     Assessment:  Chronic damaged nailbeds hallux bilateral with pain left over right     Plan:  H&P reviewed conditions and recommended removal of the nail we will do 100 time and I explained procedure risk and she wants surgery.  Today I infiltrated the left hallux 60 mg like Marcaine mixture sterile prep done using sterile instrumentation remove the nail exposed matrix applied phenol for applications 30 seconds followed by alcohol lavage sterile dressing gave instructions on soaks and reappoint to recheck.  Leave dressing on 24 hours take it off earlier if throbbing were to occur

## 2022-04-13 DIAGNOSIS — E1140 Type 2 diabetes mellitus with diabetic neuropathy, unspecified: Secondary | ICD-10-CM | POA: Diagnosis not present

## 2022-04-15 NOTE — Telephone Encounter (Signed)
Received call from 911 call center. Advised that officer made visit to patient's home and patient is doing fine.  Knox Saliva, PharmD, MPH, BCPS, CPP Clinical Pharmacist (Rheumatology and Pulmonology)

## 2022-04-15 NOTE — Telephone Encounter (Signed)
Zack from [place] called requesting new script of esbriet sent to fax [phone]. Call back number for zack is [phone].
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
## 2022-04-22 ENCOUNTER — Encounter: Payer: Medicare HMO | Admitting: *Deleted

## 2022-04-22 DIAGNOSIS — Z006 Encounter for examination for normal comparison and control in clinical research program: Secondary | ICD-10-CM

## 2022-04-22 DIAGNOSIS — J84112 Idiopathic pulmonary fibrosis: Secondary | ICD-10-CM

## 2022-04-22 NOTE — Research (Signed)
Title:A Randomized, Double-blind, Placebo-controlled, Phase 3 Study of the Efficacy and Safety of Inhaled Treprostinil in Subjects with Idiopathic Pulmonary Fibrosis   Dose and Duration of Treatment:Treprostinil for Inhalation 0.6 mg/mL, or placebo (randomly assigned 1:1) over a 52 week period.  Protocol # [id number]-301; IND # [id number]; Clinical Trials.gov Identifier: [id number]  Sponsor: [place], [place] [address]  Protocol Version: Amendment 4 dated: 26Jul2023 IB: Version 18- [id number] ICF: Ver. 3.0, 13 Jan 2022, WCG Ver. 3.1, 10 Mar 2022 Lab Manual: V4 dated 07Jun2023  Key Features of the Drug: Treprostinil (LRX-15, 15AU81, UT-15) is a stable tricyclic benzindene analogue of the naturally occurring prostacyclin (PGI2; epoprostenol), a member of the eicosanoid family of autocoids; these compounds occur widely in tissues and have important pharmacological properties with potent activity, especially on the cardiovascular system and smooth muscle ([place]). An inhalation solution of treprostinil, 0.6 mg/mL, delivered using an ultrasonic nebulizer, is currently FDA approved to treat pulmonary arterial hypertension (PAH); [WHO Group 1, PH-ILD; WHO Group 3. Inhaled treprostinil is NOW being evaluated as primary treatment of IPF, idiopathic pulmonary fibrosis.   Key Inclusion Criteria: ?67 years of age Diagnosis of IPF based on the 2018 ATS/ERS/JRS/ALAT Clinical Practice Guideline (FVC ?45% predicted at Screening).  Subjects on pirfenidone or nintedanib must be on a stable and optimized dose for ?30 days prior to Baseline. Concomitant use of both pirfenidone and nintedanib is not permitted. Males with a partner of childbearing potential must use a condom for the duration of treatment and for at least 48 hours after discontinuing study drug.   Key Exclusion Criteria: Subject is pregnant or lactating.  FEV1/FVC <0.70 at Screening.  Prior intolerance or significant  lack of efficacy to a prostacyclin or prostacyclin analogue that resulted in discontinuation or inability to effectively titrate that therapy.  The subject has received any PAH-approved therapy, including prostacyclin therapy (epoprostenol, treprostinil, iloprost, or beraprost; except for acute vasoreactivity testing), IP receptor agonists (selexipag), endothelin receptor antagonists, phosphodiesterase type 5 inhibitors (PDE5-Is), or soluble guanylate cyclase stimulators within 60 days prior to Baseline. As needed use of a PDE5-I for erectile dysfunction is permitted, provided no doses are taken within 48 hours of any study-related efficacy assessments.  Use of any of the following medications:  `     a. Azathioprine (AZA), cyclosporine, mycophenolate mofetil, tacroliumus, oral           corticosteroids (OCS) >20 mg/day or the combination of            OCS+AZA+N-acetylcysteine within 30 days prior to Baseline.        b. Cyclophosphamide within 60 days prior to Baseline        c. Rituximab within 6 months prior to Baseline   Receiving >10 L/min of oxygen at rest at Baseline.   Exacerbation of IPF or active pulmonary or upper respiratory infection within 30 days prior to Baseline. Subjects must have completed any antibiotic or steroid regimens for treatment of the infection or acute exacerbation more than 30 days prior to Baseline to be eligible. If hospitalized for an acute exacerbation of IPF or a pulmonary or upper respiratory infection, subjects must have been discharged more than 90 days prior to Baseline to be eligible.  Uncontrolled cardiac disease, defined as myocardial infarction within 6 months prior to Baseline or unstable angina within 30 days prior to Baseline. Use of any investigational drug/device or participation in any investigational study in which the subject received a medical intervention (ie, procedure, device, medication/supplement) within 30  days prior to Screening. Subjects  participating in non-interventional, observational, or registry studies are eligible.  Life expectancy <6 months due to IPF or a concomitant illness.  Acute pulmonary embolism within 90 days prior to Baseline.   Pharmacodynamics:  (Dose-Response Relationships) - In a clinical study of 241 healthy volunteers, single doses of Tyvaso 54 ?g (the target maintenance dose per session) and 84 ?g (supratherapeutic inhalation dose) prolonged the QTc interval by approximately 10 ms. The QTc effect dissipated rapidly as the concentration of treprostinil decreased.  Pharmacokinetics: (ADME) - Treprostinil is substantially metabolized by the liver, primarily by CYP2C8.  The elimination of treprostinil (following [place] administration of treprostinil) is biphasic, with a terminal elimination t1?2 of approximately 4 hours.    Contraindications:   None  Special Warnings/Considerations: Treprostinil undergoes substantial hepatic metabolism; thus, liver impairment could result in decreased metabolism and increased systemic exposure to treprostinil potentiating the occurrence and/or severity of AEs and/or overdose. There are no adequate and well-controlled studies with Tyvaso in pregnant women. There are risks to the mother and the fetus associated with PAH. Patients who discontinue the use of any effective therapy may be expected to notice a decline in their clinical status. Patients who abruptly discontinue therapy (ie, miss 2 or more doses), or markedly reduce the dose, are at risk for worsening of PAH symptoms or a rebound in [facility]. There is no evidence to suggest or suspect that patients taking inhaled treprostinil would be at risk for withdrawal symptoms or rebound effects.  Treprostinil inhibits platelet aggregation and increases the risk of bleeding, particularly with concomitant anticoagulants and antiplatelet therapies. Treprostinil is a pulmonary and systemic vasodilator. In patients with low systemic arterial  pressure, inhaled treprostinil solution may cause symptomatic hypotension, which may present as but is not limited to light-headedness, dizziness, blurred or faded vision, and syncope. Concomitant administration of treprostinil with diuretics, antihypertensive agents, or other vasodilators that may lower blood pressure increases the risk of symptomatic hypotension. Signs and symptoms of overdose with inhaled treprostinil solution may correspond to dose-limiting pharmacologic effects, including diarrhea, flushing, headache, hypotension, nausea, and vomiting. Most events are self-limiting and resolve with reduction or withholding of inhaled treprostinil solution.  Interactions:              Drug Interaction Studies Conducted with Treprostinil: Study # P01:08: Treprostinil formulation [place] Remodulin vs Acetaminophen, to evaluate effect of acetaminophen on Treprostinil pK, showed no effect. Study # P01:12: Treprostinil formulation [place] Remodulin vs Warfarin, to evaluate effect of Warfarin on Treprostinil pK/PD, showed no effect. Study TDE-PH-105: Treprostinil formulation Orenitram vs Bosentan, to evaluate effect of Bosentan on Treprostinil pK, showed no effect. Study TDE-PH-106: Treprostinil formulation Orenitram vs Sildenafil, to evaluate effect of Sildenafil on Treprostinil pK, showed no effect. Study TDE-PH-109: Treprostinil formulation Orenitram vs Rifampicin (CYP2C8/9 inducer), to evaluate the effect of inducing the NWG9F6 metabolic pathway on the pK of Treprostinil, confirmed metabolic pathway via OZH0Q6/5. Expected interaction resulting in reduced Cmax and AUC. Study TDE-PH-110: Treprostinil formulation Orenitram vs Gemfibrozil (CYP2C8  inhibitor), to evaluate the effect of inhibiting the HQI6N6 metabolic pathway on the pK of Treprostinil, confirmed metabolic pathway primarily via CYP2C8. Expected interaction resulting in significantly increased Cmax and AUC. Study TDE-PH-110: Treprostinil formulation  Orenitram vs Fluconazole (CYP2C9 inhibitor), to evaluate the effect of inhibiting the EXB2W4 metabolic pathway on on the pK of Treprostinil, had no notable effect. Confirmed CYP2C9 plays a minor role in treprostinil metabolism.  Serious Adverse Reactions Observed in Clinical Studies with Inhaled Treprostinil: Pulmonary Arterial  Hypertension: all occurrences from RIN-PH-301 study for Pulmonary Arterial Hypertension. No serious adverse reactions met criteria for inclusion in inhaled treprostinil solution clinical studies for interstitial lung disease. System/Organ Class: Nervous System disorders - Syncope in 3 of 115 subjects exposed (2.6%)      Serious Adverse Reactions Observed Postmarketing: Gastrointestinal disorders: vomiting, nausea, diarrhea, GI Hemorrhage (reflects an aggregate of the following: GI hemorrhage, rectal hemorrhage, blood in stool [hematochezia or melena], and hematemesis). General disorders and administration site conditions: Pain Infections and infestations: Pneumonia Nervous system disorders: Syncope, dizziness, headache Respiratory, thoracic and mediastinal disorders: Hemoptysis, cough, epistaxis Vascular disorders: Hypotension, hemorrhage (unspecified blood loss ranging from nondescript to heavy or excessive bleeding).  Safety Data:  As of 28 December 2020, approximately 955 subjects have received treprostinil by inhalation across various studies, ranging from acute studies in healthy volunteers to long-term treatment in subjects with PAH and PH-ILD.   The estimate of worldwide exposure to treprostinil in the marketed inhaled formulations is approximately 03,559,741 total patient treatment-days (approximately 34,318 patient treatment-years). The most common adverse effects of inhaled treprostinil ?10% were cough, diarrhea, dizziness, flushing, headache, muscle/jaw/bone pain, nausea, pharyngolaryngeal pain, oropharyngeal pain, syncope, and throat irritation. Other adverse reactions  in the observational study comparing subjects taking inhaled treprostinil and a control group were cough, hemoptysis, nasal discomfort, and throat irritation. Angioedema is a rare but serious reaction that has been seen in the post-marketing setting with treprostinil.  Stability:  Ampoules of drug product are stable until the date indicated when stored in the unopened foil pouch. The [place] commercial labeling states the following regarding storage conditions: Store at [place] to [place] (47F to 89F), with excursions permitted to 63A to 45X (40F to 55F).  Once the foil pack is opened, ampoules should be used within 7 days. Because Tyvaso is light-sensitive, unopened ampoules should be stored in the foil pouch.  [facility] [place] Coordinator note:   This visit for Subject Erica [name] with DOB: Nov 03, 1955 on 04/22/2022 for the above protocol is Visit/Encounter # Week 16  and is for purpose of research.   Protocol Version: Amendment 4 dated: 26Jul2023 IB: Version 18- [id number] ICF: Ver. 3.0, 13 Jan 2022, WCG Ver. 3.1, 10 Mar 2022 Lab Manual: V4 dated 07Jun2023  Subject expressed continued interest and consent in continuing as a study subject. Subject confirmed that there was no change in contact information (e.g. address, telephone, email). Subject thanked for participation in research and contribution to science. In this visit 04/22/2022 the subject will not have an exam at today's visit.   The Subject was informed that the PI Dr. Brand Males continues to have oversight of the subject's visits and course through relevant discussions, reviews, and also specifically of this visit by routing of this note to the PI.  Subject arrived to visit with belongings. She was not sure if she brought all of her pieces to her nebulizer device. We checked and all pieces were accounted for. Subject completed the tasks assigned to this visit. We reviewed the AE's from her last phone call and she  believes it is due to the weather. They are ongoing and not change.  Subject is also taking Robitussin OTC daily and Vicks cough drops (3 per day). Next visit was discussed and scheduled.    Signed by  Jaye Beagle RN MSN/MBA  Clinical Research Coordinator / Nurse [facility]  [place], [place] 2:17 PM 04/22/2022

## 2022-04-22 NOTE — Research (Signed)
Title:A Randomized, Double-blind, Placebo-controlled, Phase 3 Study of the Efficacy and Safety of Inhaled Treprostinil in Subjects with Idiopathic Pulmonary Fibrosis   Dose and Duration of Treatment:Treprostinil for Inhalation 0.6 mg/mL, or placebo (randomly assigned 1:1) over a 52 week period.  Protocol # RIN-PF-301; IND # [id number]; Clinical Trials.gov Identifier: [id number]  Sponsor: [place], [place] [address]  Protocol Version: Amendment 4 dated: 26Jul2023  IB: Version 18- [id number]  ICF: Amendment 1 Version 3.0 approved 03Aug2023   Investigator Brochure Product:  Treprostinil for Inhalation, 0.6 mg/mL  Key Features of the Drug: Treprostinil (LRX-15, 15AU81, UT-15) is a stable tricyclic benzindene analogue of the naturally occurring prostacyclin (PGI2; epoprostenol), a member of the eicosanoid family of autocoids; these compounds occur widely in tissues and have important pharmacological properties with potent activity, especially on the cardiovascular system and smooth muscle ([place]). An inhalation solution of treprostinil, 0.6 mg/mL, delivered using an ultrasonic nebulizer, is currently FDA approved to treat pulmonary arterial hypertension (PAH); [WHO Group 1, PH-ILD; WHO Group 3. Inhaled treprostinil is NOW being evaluated as primary treatment of IPF, idiopathic pulmonary fibrosis.   Key Inclusion Criteria: ?67 years of age Diagnosis of IPF based on the 2018 ATS/ERS/JRS/ALAT Clinical Practice Guideline (FVC ?45% predicted at Screening).  Subjects on pirfenidone or nintedanib must be on a stable and optimized dose for ?30 days prior to Baseline. Concomitant use of both pirfenidone and nintedanib is not permitted. Males with a partner of childbearing potential must use a condom for the duration of treatment and for at least 48 hours after discontinuing study drug.   Key Exclusion Criteria: Subject is pregnant or lactating.  FEV1/FVC <0.70 at  Screening.  Prior intolerance or significant lack of efficacy to a prostacyclin or prostacyclin analogue that resulted in discontinuation or inability to effectively titrate that therapy.  The subject has received any PAH-approved therapy, including prostacyclin therapy (epoprostenol, treprostinil, iloprost, or beraprost; except for acute vasoreactivity testing), IP receptor agonists (selexipag), endothelin receptor antagonists, phosphodiesterase type 5 inhibitors (PDE5-Is), or soluble guanylate cyclase stimulators within 60 days prior to Baseline. As needed use of a PDE5-I for erectile dysfunction is permitted, provided no doses are taken within 48 hours of any study-related efficacy assessments.  Use of any of the following medications:  `     a. Azathioprine (AZA), cyclosporine, mycophenolate mofetil, tacroliumus, oral           corticosteroids (OCS) >20 mg/day or the combination of            OCS+AZA+N-acetylcysteine within 30 days prior to Baseline.        b. Cyclophosphamide within 60 days prior to Baseline        c. Rituximab within 6 months prior to Baseline   Receiving >10 L/min of oxygen at rest at Baseline.   Exacerbation of IPF or active pulmonary or upper respiratory infection within 30 days prior to Baseline. Subjects must have completed any antibiotic or steroid regimens for treatment of the infection or acute exacerbation more than 30 days prior to Baseline to be eligible. If hospitalized for an acute exacerbation of IPF or a pulmonary or upper respiratory infection, subjects must have been discharged more than 90 days prior to Baseline to be eligible.  Uncontrolled cardiac disease, defined as myocardial infarction within 6 months prior to Baseline or unstable angina within 30 days prior to Baseline. Use of any investigational drug/device or participation in any investigational study in which the subject received a medical intervention (ie, procedure, device,  medication/supplement) within  30 days prior to Screening. Subjects participating in non-interventional, observational, or registry studies are eligible.  Life expectancy <6 months due to IPF or a concomitant illness.  Acute pulmonary embolism within 90 days prior to Baseline.   Pharmacodynamics:  (Dose-Response Relationships) - In a clinical study of 241 healthy volunteers, single doses of Tyvaso 54 ?g (the target maintenance dose per session) and 84 ?g (supratherapeutic inhalation dose) prolonged the QTc interval by approximately 10 ms. The QTc effect dissipated rapidly as the concentration of treprostinil decreased.  Pharmacokinetics: (ADME) - Treprostinil is substantially metabolized by the liver, primarily by CYP2C8.  The elimination of treprostinil (following [place] administration of treprostinil) is biphasic, with a terminal elimination t1?2 of approximately 4 hours.    Contraindications:   None  Special Warnings/Considerations: Treprostinil undergoes substantial hepatic metabolism; thus, liver impairment could result in decreased metabolism and increased systemic exposure to treprostinil potentiating the occurrence and/or severity of AEs and/or overdose. There are no adequate and well-controlled studies with Tyvaso in pregnant women. There are risks to the mother and the fetus associated with PAH. Patients who discontinue the use of any effective therapy may be expected to notice a decline in their clinical status. Patients who abruptly discontinue therapy (ie, miss 2 or more doses), or markedly reduce the dose, are at risk for worsening of PAH symptoms or a rebound in [facility]. There is no evidence to suggest or suspect that patients taking inhaled treprostinil would be at risk for withdrawal symptoms or rebound effects.  Treprostinil inhibits platelet aggregation and increases the risk of bleeding, particularly with concomitant anticoagulants and antiplatelet therapies. Treprostinil is a pulmonary and systemic vasodilator. In  patients with low systemic arterial pressure, inhaled treprostinil solution may cause symptomatic hypotension, which may present as but is not limited to light-headedness, dizziness, blurred or faded vision, and syncope. Concomitant administration of treprostinil with diuretics, antihypertensive agents, or other vasodilators that may lower blood pressure increases the risk of symptomatic hypotension. Signs and symptoms of overdose with inhaled treprostinil solution may correspond to dose-limiting pharmacologic effects, including diarrhea, flushing, headache, hypotension, nausea, and vomiting. Most events are self-limiting and resolve with reduction or withholding of inhaled treprostinil solution.  Interactions:              Drug Interaction Studies Conducted with Treprostinil: Study # P01:08: Treprostinil formulation [place] Remodulin vs Acetaminophen, to evaluate effect of acetaminophen on Treprostinil pK, showed no effect. Study # P01:12: Treprostinil formulation  Remodulin vs Warfarin, to evaluate effect of Warfarin on Treprostinil pK/PD, showed no effect. Study TDE-PH-105: Treprostinil formulation Orenitram vs Bosentan, to evaluate effect of Bosentan on Treprostinil pK, showed no effect. Study TDE-PH-106: Treprostinil formulation Orenitram vs Sildenafil, to evaluate effect of Sildenafil on Treprostinil pK, showed no effect. Study TDE-PH-109: Treprostinil formulation Orenitram vs Rifampicin (CYP2C8/9 inducer), to evaluate the effect of inducing the HMC9O7 metabolic pathway on the pK of Treprostinil, confirmed metabolic pathway via SJG2E3/6. Expected interaction resulting in reduced Cmax and AUC. Study TDE-PH-110: Treprostinil formulation Orenitram vs Gemfibrozil (CYP2C8  inhibitor), to evaluate the effect of inhibiting the OQH4T6 metabolic pathway on the pK of Treprostinil, confirmed metabolic pathway primarily via CYP2C8. Expected interaction resulting in significantly increased Cmax and AUC. Study  TDE-PH-110: Treprostinil formulation Orenitram vs Fluconazole (CYP2C9 inhibitor), to evaluate the effect of inhibiting the LYY5K3 metabolic pathway on on the pK of Treprostinil, had no notable effect. Confirmed CYP2C9 plays a minor role in treprostinil metabolism.  Serious Adverse Reactions Observed in Clinical Studies with Inhaled  Treprostinil: Pulmonary Arterial Hypertension: all occurrences from RIN-PH-301 study for Pulmonary Arterial Hypertension. No serious adverse reactions met criteria for inclusion in inhaled treprostinil solution clinical studies for interstitial lung disease. System/Organ Class: Nervous System disorders - Syncope in 3 of 115 subjects exposed (2.6%)      Serious Adverse Reactions Observed Postmarketing: Gastrointestinal disorders: vomiting, nausea, diarrhea, GI Hemorrhage (reflects an aggregate of the following: GI hemorrhage, rectal hemorrhage, blood in stool [hematochezia or melena], and hematemesis). General disorders and administration site conditions: Pain Infections and infestations: Pneumonia Nervous system disorders: Syncope, dizziness, headache Respiratory, thoracic and mediastinal disorders: Hemoptysis, cough, epistaxis Vascular disorders: Hypotension, hemorrhage (unspecified blood loss ranging from nondescript to heavy or excessive bleeding).  Safety Data:  As of 28 December 2020, approximately 955 subjects have received treprostinil by inhalation across various studies, ranging from acute studies in healthy volunteers to long-term treatment in subjects with PAH and PH-ILD.   The estimate of worldwide exposure to treprostinil in the marketed inhaled formulations is approximately 33,545,625 total patient treatment-days (approximately 34,318 patient treatment-years). The most common adverse effects of inhaled treprostinil ?10% were cough, diarrhea, dizziness, flushing, headache, muscle/jaw/bone pain, nausea, pharyngolaryngeal pain, oropharyngeal pain, syncope, and  throat irritation. Other adverse reactions in the observational study comparing subjects taking inhaled treprostinil and a control group were cough, hemoptysis, nasal discomfort, and throat irritation. Angioedema is a rare but serious reaction that has been seen in the post-marketing setting with treprostinil.  Stability:  Ampoules of drug product are stable until the date indicated when stored in the unopened foil pouch. The [place] commercial labeling states the following regarding storage conditions: Store at [place] to [place] (101F to 3F), with excursions permitted to 63S to 93T (52F to 52F).  Once the foil pack is opened, ampoules should be used within 7 days. Because Tyvaso is light-sensitive, unopened ampoules should be stored in the foil pouch. [facility] [place] Coordinator note:   This visit for Subject Erica [name] with DOB: 1955-06-27 on 14Sep2023 for the above protocol is Visit/Encounter # Week 8 and is for purpose of research.   Protocol Version: Amendment 4 dated: 26Jul2023  IB: Version 18- [id number]  ICF: Amendment 1 Version 3.0 approved 03Aug2023    Subject expressed continued interest and consent in continuing as a study subject. Subject confirmed that there was  no change in contact information (e.g. address, telephone, email). Subject thanked for participation in research and contribution to science. In this visit 14Sep2023 the subject will be not have a physical exam   The Subject was informed that the PI Dr. Brand Males continues to have oversight of the subject's visits and course through relevant discussions, reviews, and also specifically of this visit by routing of this note to the PI.   Subject came for her visit. New consent was signed. She became sleepy as the visit went on. Subject states she took her gabapentin before coming in for the visit. We had a discussion that she does not need to take the gabapentin prior to the visit for the next visit.  All assessments from the protocol were completed. Next visit was set up for [id number] and scheduled.   Signed by  Jaye Beagle RN MSN/MBA  Clinical Research Coordinator / Nurse [facility]  [place], [place] 2:28 PM 04/22/2022

## 2022-04-25 ENCOUNTER — Other Ambulatory Visit: Payer: Self-pay | Admitting: Cardiology

## 2022-04-25 DIAGNOSIS — I639 Cerebral infarction, unspecified: Secondary | ICD-10-CM | POA: Diagnosis not present

## 2022-04-25 DIAGNOSIS — R0602 Shortness of breath: Secondary | ICD-10-CM

## 2022-04-25 DIAGNOSIS — Z4509 Encounter for adjustment and management of other cardiac device: Secondary | ICD-10-CM | POA: Diagnosis not present

## 2022-04-25 DIAGNOSIS — E782 Mixed hyperlipidemia: Secondary | ICD-10-CM

## 2022-04-25 DIAGNOSIS — Z95818 Presence of other cardiac implants and grafts: Secondary | ICD-10-CM | POA: Diagnosis not present

## 2022-05-01 ENCOUNTER — Other Ambulatory Visit: Payer: Self-pay | Admitting: Cardiology

## 2022-05-01 DIAGNOSIS — N1832 Chronic kidney disease, stage 3b: Secondary | ICD-10-CM

## 2022-05-01 DIAGNOSIS — R0602 Shortness of breath: Secondary | ICD-10-CM

## 2022-05-01 DIAGNOSIS — N183 Chronic kidney disease, stage 3 unspecified: Secondary | ICD-10-CM

## 2022-05-09 ENCOUNTER — Ambulatory Visit: Payer: Medicare HMO | Admitting: Podiatry

## 2022-05-13 DIAGNOSIS — E1140 Type 2 diabetes mellitus with diabetic neuropathy, unspecified: Secondary | ICD-10-CM | POA: Diagnosis not present

## 2022-05-18 ENCOUNTER — Ambulatory Visit: Payer: Medicare HMO | Admitting: Podiatry

## 2022-05-20 ENCOUNTER — Ambulatory Visit (INDEPENDENT_AMBULATORY_CARE_PROVIDER_SITE_OTHER): Payer: Medicare HMO | Admitting: Podiatry

## 2022-05-20 ENCOUNTER — Encounter: Payer: Self-pay | Admitting: Podiatry

## 2022-05-20 DIAGNOSIS — L600 Ingrowing nail: Secondary | ICD-10-CM | POA: Diagnosis not present

## 2022-05-22 NOTE — Progress Notes (Signed)
Subjective:   Patient ID: Erica [name], female   DOB: 66 y.o.   MRN: [id number]   HPI Patient presents stating he wanted to get the left hallux nail checked as she has had a little bit of soreness everything else okay   ROS      Objective:  Physical Exam  Neurovascular status intact with some crusted tissue dorsal left no proximal edema erythema drainage noted and no indications of nail regrowth     Assessment:  Appears to be more of a crusted nailbed versus any other pathology     Plan:  Advised on soaks and that it does not appear infected I gave her a cushion to wear over the toe if issues were to occur she is to let us know immediately

## 2022-05-25 DIAGNOSIS — E119 Type 2 diabetes mellitus without complications: Secondary | ICD-10-CM | POA: Diagnosis not present

## 2022-05-25 DIAGNOSIS — N3946 Mixed incontinence: Secondary | ICD-10-CM | POA: Diagnosis not present

## 2022-05-26 DIAGNOSIS — Z4509 Encounter for adjustment and management of other cardiac device: Secondary | ICD-10-CM | POA: Diagnosis not present

## 2022-05-26 DIAGNOSIS — I639 Cerebral infarction, unspecified: Secondary | ICD-10-CM | POA: Diagnosis not present

## 2022-05-26 DIAGNOSIS — Z95818 Presence of other cardiac implants and grafts: Secondary | ICD-10-CM | POA: Diagnosis not present

## 2022-05-29 ENCOUNTER — Other Ambulatory Visit: Payer: Self-pay | Admitting: Cardiology

## 2022-05-29 DIAGNOSIS — R0602 Shortness of breath: Secondary | ICD-10-CM

## 2022-05-29 DIAGNOSIS — N183 Chronic kidney disease, stage 3 unspecified: Secondary | ICD-10-CM

## 2022-05-29 DIAGNOSIS — E1122 Type 2 diabetes mellitus with diabetic chronic kidney disease: Secondary | ICD-10-CM

## 2022-06-06 ENCOUNTER — Other Ambulatory Visit: Payer: Self-pay | Admitting: Internal Medicine

## 2022-06-06 DIAGNOSIS — Z5181 Encounter for therapeutic drug level monitoring: Secondary | ICD-10-CM

## 2022-06-06 DIAGNOSIS — J84112 Idiopathic pulmonary fibrosis: Secondary | ICD-10-CM

## 2022-06-13 DIAGNOSIS — E1140 Type 2 diabetes mellitus with diabetic neuropathy, unspecified: Secondary | ICD-10-CM | POA: Diagnosis not present

## 2022-06-21 ENCOUNTER — Encounter: Payer: Self-pay | Admitting: Podiatry

## 2022-06-21 ENCOUNTER — Ambulatory Visit (INDEPENDENT_AMBULATORY_CARE_PROVIDER_SITE_OTHER): Payer: Medicare HMO | Admitting: Podiatry

## 2022-06-21 VITALS — BP 118/66 | HR 81

## 2022-06-21 DIAGNOSIS — L84 Corns and callosities: Secondary | ICD-10-CM

## 2022-06-21 DIAGNOSIS — Z794 Long term (current) use of insulin: Secondary | ICD-10-CM | POA: Diagnosis not present

## 2022-06-21 DIAGNOSIS — E1165 Type 2 diabetes mellitus with hyperglycemia: Secondary | ICD-10-CM | POA: Diagnosis not present

## 2022-06-21 NOTE — Progress Notes (Signed)
  Subjective:  Patient ID: Erica [name], female    DOB: [birth year],   MRN: [id number]  Chief Complaint  Patient presents with   Ingrown Toenail    Patient is here for left foot great toe ingrown toe nail.patient is diabetic, redness on the tip of the toe very painful.    67 y.o. female presents for concern of  left ingrown toenail. She has bene in the care of Dr. Paulla Dolly and has undergone ingrown nail procedure on 04/11/22 left total procedure. States she still has pain at the very tip of her toe. Relates she gets shoes a size too big to try and help with the toe pain.  Denies any other pedal complaints. Denies n/v/f/c.   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 CFT <3 seconds. Normal hair growth on digits. No edema.  Skin. No lacerations or abrasions bilateral feet. Left hallux nail bed well healed. Some distal hyperkeratosis noted to nail bed. Distal left hallux with erythema and appears to be an area of rubbing.  Musculoskeletal: MMT 5/5 bilateral lower extremities in DF, PF, Inversion and Eversion. Deceased ROM in DF of ankle joint.  Neurological: Sensation intact to light touch.   Assessment:   1. Pre-ulcerative calluses   2. Controlled type 2 diabetes mellitus with hyperglycemia, with long-term current use of insulin ([place])      Plan:  Patient was evaluated and treated and all questions answered. -Discussed and educated patient on  diabetic foot care, especially with  regards to the vascular, neurological and musculoskeletal systems.  -Stressed the importance of good glycemic control and the detriment of not  controlling glucose levels in relation to the foot. -Discussed supportive shoes at all times and checking feet regularly.  -Feel the area of pain is from too much pressure on the end of toe and not from the nail. Discussed protecting this area.  -Toe cap provided to protect distal toe.  -Will also get fitted for DM shoes to keep pressure off this area.  -Answered all patient questions -Patient to return  in 1 year for DM foot check.  -Patient advised to call the office if any problems or questions arise in the meantime.   Lorenda Peck, DPM

## 2022-06-27 ENCOUNTER — Other Ambulatory Visit: Payer: Self-pay | Admitting: Internal Medicine

## 2022-06-27 DIAGNOSIS — J84112 Idiopathic pulmonary fibrosis: Secondary | ICD-10-CM

## 2022-06-27 DIAGNOSIS — Z5181 Encounter for therapeutic drug level monitoring: Secondary | ICD-10-CM

## 2022-06-28 NOTE — Progress Notes (Deleted)
Patient presents to the office today for diabetic shoe and insole measuring.  Patient was measured with brannock device to determine size and width for 1 pair of extra depth shoes and foam casted for 3 pair of insoles.   ABN signed.   Documentation of medical necessity will be sent to patient's treating diabetic doctor to verify and sign.   Patient's diabetic provider: ***  Shoes and insoles will be ordered at that time and patient will be notified for an appointment for fitting when they arrive.   Brannock measurement: ***  Patient shoe selection-   1st   Shoe choice:   ***  2nd  Shoe choice:   ***  Shoe size ordered: ***

## 2022-06-29 ENCOUNTER — Telehealth: Payer: Self-pay

## 2022-06-29 ENCOUNTER — Ambulatory Visit: Payer: Medicare HMO

## 2022-06-29 DIAGNOSIS — E1165 Type 2 diabetes mellitus with hyperglycemia: Secondary | ICD-10-CM

## 2022-06-29 DIAGNOSIS — L84 Corns and callosities: Secondary | ICD-10-CM

## 2022-06-29 NOTE — Progress Notes (Signed)
Patient presents to the office today for diabetic shoe and insole measuring.  Patient was measured with brannock device to determine size and width for 1 pair of extra depth shoes and foam casted for 3 pair of insoles.   ABN signed.   Documentation of medical necessity will be sent to patient's treating diabetic doctor to verify and sign.   Patient's diabetic provider: Nolene Ebbs, MD   Shoes and insoles will be ordered at that time and patient will be notified for an appointment for fitting when they arrive.   Brannock measurement: 9.5  Patient shoe selection-   1st   Shoe choice:   [id number]  Shoe size ordered: 9 PER PT REQUEST   Patient is going to call to get scheduled with PCP to follow up as it has been over 5 months and the paperwork needs to be within 6 months of in person visit

## 2022-06-29 NOTE — Telephone Encounter (Signed)
Overdue Remote Transmissions: 44 days  Called patient, NA, LMAM

## 2022-06-30 ENCOUNTER — Ambulatory Visit (INDEPENDENT_AMBULATORY_CARE_PROVIDER_SITE_OTHER): Payer: Medicare HMO

## 2022-06-30 ENCOUNTER — Telehealth: Payer: Self-pay | Admitting: Adult Health

## 2022-06-30 DIAGNOSIS — J84112 Idiopathic pulmonary fibrosis: Secondary | ICD-10-CM

## 2022-06-30 DIAGNOSIS — Z5181 Encounter for therapeutic drug level monitoring: Secondary | ICD-10-CM

## 2022-06-30 DIAGNOSIS — L84 Corns and callosities: Secondary | ICD-10-CM

## 2022-06-30 DIAGNOSIS — Z794 Long term (current) use of insulin: Secondary | ICD-10-CM

## 2022-06-30 DIAGNOSIS — E1165 Type 2 diabetes mellitus with hyperglycemia: Secondary | ICD-10-CM

## 2022-06-30 MED ORDER — PIRFENIDONE 801 MG PO TABS: ORAL_TABLET | ORAL | 1 refills | Status: DC

## 2022-06-30 NOTE — Telephone Encounter (Signed)
Pharmacy called to get clarification on a script for Pirfenidone for the patient.  Please call to discuss at [phone]

## 2022-06-30 NOTE — Telephone Encounter (Signed)
Refill for pirfenidone 835m three times daily sent to [facility] Patient has labs monitored with [facility].  DKnox Saliva PharmD, MPH, BCPS, CPP Clinical Pharmacist (Rheumatology and Pulmonology)

## 2022-07-01 NOTE — Telephone Encounter (Signed)
Spoke with patient, she will connect it back up as soon as she finds her Games developer. I also gave her the number to [facility] support to see if they can send her a new charger cord.

## 2022-07-03 DIAGNOSIS — I639 Cerebral infarction, unspecified: Secondary | ICD-10-CM | POA: Diagnosis not present

## 2022-07-03 DIAGNOSIS — Z95818 Presence of other cardiac implants and grafts: Secondary | ICD-10-CM | POA: Diagnosis not present

## 2022-07-03 DIAGNOSIS — Z4509 Encounter for adjustment and management of other cardiac device: Secondary | ICD-10-CM | POA: Diagnosis not present

## 2022-07-06 DIAGNOSIS — E119 Type 2 diabetes mellitus without complications: Secondary | ICD-10-CM | POA: Diagnosis not present

## 2022-07-06 DIAGNOSIS — N3946 Mixed incontinence: Secondary | ICD-10-CM | POA: Diagnosis not present

## 2022-07-14 ENCOUNTER — Encounter: Payer: Medicare HMO | Admitting: Adult Health

## 2022-07-15 ENCOUNTER — Encounter (INDEPENDENT_AMBULATORY_CARE_PROVIDER_SITE_OTHER): Payer: Medicare HMO | Admitting: Adult Health

## 2022-07-15 ENCOUNTER — Encounter: Payer: Medicare HMO | Admitting: *Deleted

## 2022-07-15 DIAGNOSIS — J84112 Idiopathic pulmonary fibrosis: Secondary | ICD-10-CM

## 2022-07-15 DIAGNOSIS — Z006 Encounter for examination for normal comparison and control in clinical research program: Secondary | ICD-10-CM

## 2022-07-15 DIAGNOSIS — J849 Interstitial pulmonary disease, unspecified: Secondary | ICD-10-CM

## 2022-07-15 NOTE — Assessment & Plan Note (Signed)
Continue current regimen

## 2022-07-15 NOTE — Progress Notes (Signed)
$'@Patient'R$  ID: Erica [name], female    DOB: [birth year], 67 y.o.   MRN: [id number]  Chief Complaint  Patient presents with   Research    Referring provider: Nolene Ebbs, MD  HPI: 67 yo female former smoker with COPD, ILD , PAH-ILD.    Title:A Randomized, Double-blind, Placebo-controlled, Phase 3 Study of the Efficacy and Safety of Inhaled Treprostinil in Subjects with Idiopathic Pulmonary Fibrosis   Dose and Duration of Treatment:Treprostinil for Inhalation 0.6 mg/mL, or placebo (randomly assigned 1:1) over a 52 week period.  Protocol # RIN-PF-301; IND # [id number]; Clinical Trials.gov Identifier: [id number]  Sponsor: [place],  [address]   Key Features of the Drug: Treprostinil (LRX-15, 15AU81, UT-15) is a stable tricyclic benzindene analogue of the naturally occurring prostacyclin (PGI2; epoprostenol), a member of the eicosanoid family of autocoids; these compounds occur widely in tissues and have important pharmacological properties with potent activity, especially on the cardiovascular system and smooth muscle (Elim). An inhalation solution of treprostinil, 0.6 mg/mL, delivered using an ultrasonic nebulizer, is currently FDA approved to treat pulmonary arterial hypertension (PAH); [WHO Group 1, PH-ILD; WHO Group 3. Inhaled treprostinil is NOW being evaluated as primary treatment of IPF, idiopathic pulmonary fibrosis.   Key Inclusion Criteria: ?68 years of age Diagnosis of IPF based on the 2018 ATS/ERS/JRS/ALAT Clinical Practice Guideline (FVC ?45% predicted at Screening).  Subjects on pirfenidone or nintedanib must be on a stable and optimized dose for ?30 days prior to Baseline. Concomitant use of both pirfenidone and nintedanib is not permitted. Males with a partner of childbearing potential must use a condom for the duration of treatment and for at least 48 hours after discontinuing study drug.   Key Exclusion Criteria: Subject  is pregnant or lactating.  FEV1/FVC <0.70 at Screening.  Prior intolerance or significant lack of efficacy to a prostacyclin or prostacyclin analogue that resulted in discontinuation or inability to effectively titrate that therapy.  The subject has received any PAH-approved therapy, including prostacyclin therapy (epoprostenol, treprostinil, iloprost, or beraprost; except for acute vasoreactivity testing), IP receptor agonists (selexipag), endothelin receptor antagonists, phosphodiesterase type 5 inhibitors (PDE5-Is), or soluble guanylate cyclase stimulators within 60 days prior to Baseline. As needed use of a PDE5-I for erectile dysfunction is permitted, provided no doses are taken within 48 hours of any study-related efficacy assessments.  Use of any of the following medications:  `     a. Azathioprine (AZA), cyclosporine, mycophenolate mofetil, tacroliumus, oral           corticosteroids (OCS) >20 mg/day or the combination of            OCS+AZA+N-acetylcysteine within 30 days prior to Baseline.        b. Cyclophosphamide within 60 days prior to Baseline        c. Rituximab within 6 months prior to Baseline   Receiving >10 L/min of oxygen at rest at Baseline.   Exacerbation of IPF or active pulmonary or upper respiratory infection within 30 days prior to Baseline. Subjects must have completed any antibiotic or steroid regimens for treatment of the infection or acute exacerbation more than 30 days prior to Baseline to be eligible. If hospitalized for an acute exacerbation of IPF or a pulmonary or upper respiratory infection, subjects must have been discharged more than 90 days prior to Baseline to be eligible.  Uncontrolled cardiac disease, defined as myocardial infarction within 6 months prior to Baseline or unstable angina within 30 days prior to Baseline. Use  of any investigational drug/device or participation in any investigational study in which the subject received a medical intervention (ie,  procedure, device, medication/supplement) within 30 days prior to Screening. Subjects participating in non-interventional, observational, or registry studies are eligible.  Life expectancy <6 months due to IPF or a concomitant illness.  Acute pulmonary embolism within 90 days prior to Baseline.   Pharmacodynamics:  (Dose-Response Relationships) - In a clinical study of 241 healthy volunteers, single doses of Tyvaso 54 ?g (the target maintenance dose per session) and 84 ?g (supratherapeutic inhalation dose) prolonged the QTc interval by approximately 10 ms. The QTc effect dissipated rapidly as the concentration of treprostinil decreased.  Pharmacokinetics: (ADME) - Treprostinil is substantially metabolized by the liver, primarily by CYP2C8.  The elimination of treprostinil (following Mitchell administration of treprostinil) is biphasic, with a terminal elimination t1?2 of approximately 4 hours.    Contraindications:   None  Special Warnings/Considerations: Treprostinil undergoes substantial hepatic metabolism; thus, liver impairment could result in decreased metabolism and increased systemic exposure to treprostinil potentiating the occurrence and/or severity of AEs and/or overdose. There are no adequate and well-controlled studies with Tyvaso in pregnant women. There are risks to the mother and the fetus associated with PAH. Patients who discontinue the use of any effective therapy may be expected to notice a decline in their clinical status. Patients who abruptly discontinue therapy (ie, miss 2 or more doses), or markedly reduce the dose, are at risk for worsening of PAH symptoms or a rebound in [facility]. There is no evidence to suggest or suspect that patients taking inhaled treprostinil would be at risk for withdrawal symptoms or rebound effects.  Treprostinil inhibits platelet aggregation and increases the risk of bleeding, particularly with concomitant anticoagulants and antiplatelet  therapies. Treprostinil is a pulmonary and systemic vasodilator. In patients with low systemic arterial pressure, inhaled treprostinil solution may cause symptomatic hypotension, which may present as but is not limited to light-headedness, dizziness, blurred or faded vision, and syncope. Concomitant administration of treprostinil with diuretics, antihypertensive agents, or other vasodilators that may lower blood pressure increases the risk of symptomatic hypotension. Signs and symptoms of overdose with inhaled treprostinil solution may correspond to dose-limiting pharmacologic effects, including diarrhea, flushing, headache, hypotension, nausea, and vomiting. Most events are self-limiting and resolve with reduction or withholding of inhaled treprostinil solution.  Interactions:              Drug Interaction Studies Conducted with Treprostinil: Study # P01:08: Treprostinil formulation [place] Remodulin vs Acetaminophen, to evaluate effect of acetaminophen on Treprostinil pK, showed no effect. Study # P01:12: Treprostinil formulation [place] Remodulin vs Warfarin, to evaluate effect of Warfarin on Treprostinil pK/PD, showed no effect. Study TDE-PH-105: Treprostinil formulation Orenitram vs Bosentan, to evaluate effect of Bosentan on Treprostinil pK, showed no effect. Study TDE-PH-106: Treprostinil formulation Orenitram vs Sildenafil, to evaluate effect of Sildenafil on Treprostinil pK, showed no effect. Study TDE-PH-109: Treprostinil formulation Orenitram vs Rifampicin (CYP2C8/9 inducer), to evaluate the effect of inducing the ULA4T3 metabolic pathway on the pK of Treprostinil, confirmed metabolic pathway via MIW8E3/2. Expected interaction resulting in reduced Cmax and AUC. Study TDE-PH-110: Treprostinil formulation Orenitram vs Gemfibrozil (CYP2C8  inhibitor), to evaluate the effect of inhibiting the ZYY4M2 metabolic pathway on the pK of Treprostinil, confirmed metabolic pathway primarily via CYP2C8. Expected  interaction resulting in significantly increased Cmax and AUC. Study TDE-PH-110: Treprostinil formulation Orenitram vs Fluconazole (CYP2C9 inhibitor), to evaluate the effect of inhibiting the NOI3B0 metabolic pathway on on the pK of Treprostinil, had no  notable effect. Confirmed CYP2C9 plays a minor role in treprostinil metabolism.  Serious Adverse Reactions Observed in Clinical Studies with Inhaled Treprostinil: Pulmonary Arterial Hypertension: all occurrences from RIN-PH-301 study for Pulmonary Arterial Hypertension. No serious adverse reactions met criteria for inclusion in inhaled treprostinil solution clinical studies for interstitial lung disease. System/Organ Class: Nervous System disorders - Syncope in 3 of 115 subjects exposed (2.6%)      Serious Adverse Reactions Observed Postmarketing: Gastrointestinal disorders: vomiting, nausea, diarrhea, GI Hemorrhage (reflects an aggregate of the following: GI hemorrhage, rectal hemorrhage, blood in stool [hematochezia or melena], and hematemesis). General disorders and administration site conditions: Pain Infections and infestations: Pneumonia Nervous system disorders: Syncope, dizziness, headache Respiratory, thoracic and mediastinal disorders: Hemoptysis, cough, epistaxis Vascular disorders: Hypotension, hemorrhage (unspecified blood loss ranging from nondescript to heavy or excessive bleeding).  Safety Data:  As of 28 December 2020, approximately 955 subjects have received treprostinil by inhalation across various studies, ranging from acute studies in healthy volunteers to long-term treatment in subjects with PAH and PH-ILD.   The estimate of worldwide exposure to treprostinil in the marketed inhaled formulations is approximately 68,088,110 total patient treatment-days (approximately 34,318 patient treatment-years). The most common adverse effects of inhaled treprostinil ?10% were cough, diarrhea, dizziness, flushing, headache, muscle/jaw/bone pain,  nausea, pharyngolaryngeal pain, oropharyngeal pain, syncope, and throat irritation. Other adverse reactions in the observational study comparing subjects taking inhaled treprostinil and a control group were cough, hemoptysis, nasal discomfort, and throat irritation. Angioedema is a rare but serious reaction that has been seen in the post-marketing setting with treprostinil.  Stability:  Ampoules of drug product are stable until the date indicated when stored in the unopened foil pouch. The [place] commercial labeling states the following regarding storage conditions: Store at [facility] to Hand (31F to 69F), with excursions permitted to 31R to 94V (49F to 29F).  Once the foil pack is opened, ampoules should be used within 7 days. Because Tyvaso is light-sensitive, unopened ampoules should be stored in the foil pouch.  07/15/2022 Research Visit  This visit for Subject Erica [name] with DOB: 04-01-1956 on 07/15/2022 for the above protocol  for purpose of research  . Subject/LAR expressed continued interest and consent in continuing as a study subject. Subject thanked for participation in research and contribution to science. Research exam completed per protcol.    Allergies  Allergen Reactions   Penicillins Hives, Itching and Swelling    Tongue swells up Has patient had a PCN reaction causing immediate rash, facial/tongue/throat swelling, SOB or lightheadedness with hypotension: Yes Has patient had a PCN reaction causing severe rash involving mucus membranes or skin necrosis: Yes Has patient had a PCN reaction that required hospitalization Yes Has patient had a PCN reaction occurring within the last 10 years: No If all of the above answers are "NO", then may proceed with Cephalosporin use.     Immunization History  Administered Date(s) Administered   Fluad Quad(high Dose 65+) 04/28/2021   Influenza Split 03/13/2017   Influenza Whole 04/26/2018   Influenza, High Dose Seasonal PF 02/29/2020    Influenza,inj,Quad PF,6+ Mos 03/09/2019   Influenza-Unspecified 03/13/2016   PFIZER(Purple Top)SARS-COV-2 Vaccination 08/19/2019, 09/18/2019    Past Medical History:  Diagnosis Date   Arthritis    especially in shoulders   Asthma    Depression    Diabetes mellitus    Embolic stroke ([place]) 8/59/2924   GERD (gastroesophageal reflux disease)    Headache(784.0)    "mild"   Hypertension    Loop  Biotronik loop implant 12/16/2020 12/16/2020   Mental disorder    depression   Neuropathy    feet    Pain    arthritis pain - takes tramadol as needed   Rectal polyp    very little bleeding with bowel movements- no pain   Stroke ([place]) 11/03/2020   Suicide attempt ([place])     Tobacco History: Social History   Tobacco Use  Smoking Status Former   Packs/day: 0.50   Years: 20.00   Total pack years: 10.00   Types: Cigarettes   Quit date: 01/05/2018   Years since quitting: 4.5  Smokeless Tobacco Never  Tobacco Comments   Unknown    Counseling given: Not Answered Tobacco comments: Unknown    Outpatient Medications Prior to Visit  Medication Sig Dispense Refill   acetaminophen (TYLENOL) 325 MG tablet Take 2 tablets (650 mg total) by mouth every 4 (four) hours as needed for mild pain (or temp > 37.5 C (99.5 F)).     albuterol (VENTOLIN HFA) 108 (90 Base) MCG/ACT inhaler Inhale 2 puffs into the lungs every 4 (four) hours as needed for wheezing. 1 g 5   amLODipine (NORVASC) 10 MG tablet Take 1 tablet (10 mg total) by mouth daily. 30 tablet 2   aspirin EC 81 MG EC tablet Take 1 tablet (81 mg total) by mouth daily. Swallow whole. 30 tablet 11   famotidine (PEPCID) 20 MG tablet Take 1 tablet (20 mg total) by mouth daily. 30 tablet 0   fenofibrate (TRICOR) 145 MG tablet TAKE 1 TABLET(145 MG) BY MOUTH DAILY (Patient taking differently: Take 145 mg by mouth daily.) 90 tablet 3   gabapentin (NEURONTIN) 100 MG capsule Take 2 capsules (200 mg total) by mouth 2 (two) times daily as needed (pain).  (Patient taking differently: Take 100 mg by mouth 2 (two) times daily.) 60 capsule 0   hydrALAZINE (APRESOLINE) 25 MG tablet Take 25 mg by mouth in the morning and at bedtime.     insulin glargine (LANTUS SOLOSTAR) 100 UNIT/ML Solostar Pen Please provide 24 units every a.m. and 20 units every p.m. (Patient taking differently: Inject 18-24 Units into the skin See admin instructions. 24 units in the morning 18 units at bedtime) 15 mL 11   Insulin Pen Needle (PEN NEEDLES) 32G X 6 MM MISC 1 application by Does not apply route 2 (two) times daily. 100 each 1   Multiple Vitamin (MULTIVITAMIN ADULT PO) Take 1 tablet by mouth daily.     oxyCODONE (ROXICODONE) 5 MG immediate release tablet Take 1 tablet (5 mg total) by mouth every 4 (four) hours as needed for severe pain. 9 tablet 0   pantoprazole (PROTONIX) 40 MG tablet Take 40 mg by mouth daily.     Pirfenidone 801 MG TABS TAKE 1 TABLET (801 MG) BY MOUTH WITH BREAKFAST, WITH LUNCH, AND WITH EVENING MEAL. 90 tablet 1   rosuvastatin (CRESTOR) 20 MG tablet Take 1 tablet (20 mg total) by mouth daily. 90 tablet 1   sacubitril-valsartan (ENTRESTO) 49-51 MG TAKE 1 TABLET BY MOUTH TWICE DAILY 180 tablet 1   sertraline (ZOLOFT) 50 MG tablet Take 50 mg by mouth daily.     SYMBICORT 160-4.5 MCG/ACT inhaler INHALE 2 PUFFS INTO THE LUNGS TWICE DAILY 10.2 g 5   Tiotropium Bromide Monohydrate (SPIRIVA RESPIMAT) 1.25 MCG/ACT AERS Inhale 2 puffs into the lungs daily. 4 g 5   venlafaxine XR (EFFEXOR-XR) 150 MG 24 hr capsule Take 150 mg by mouth daily.  No facility-administered medications prior to visit.      Physical Exam See research exam      BNP No results found for: "BNP"    Imaging: No results found.       Latest Ref Rng & Units 01/26/2021    2:55 PM 06/03/2020    2:47 PM 06/13/2019    8:56 AM 10/24/2017    2:58 PM 08/11/2016   10:13 AM  PFT Results  FVC-Pre L 2.79  2.59  2.52  2.79  2.56   FVC-Predicted Pre % 112  102  98  108  98    FVC-Post L   2.63   2.99   FVC-Predicted Post %   102   114   Pre FEV1/FVC % % 82  82  81  69  67   Post FEV1/FCV % %   81   67   FEV1-Pre L 2.29  2.12  2.05  1.93  1.72   FEV1-Predicted Pre % 117  107  102  95  84   FEV1-Post L   2.13   2.01   DLCO uncorrected ml/min/mmHg 7.71  8.38  7.70   14.81   DLCO UNC% % 39  42  38   61   DLCO corrected ml/min/mmHg 7.71  8.38    14.73   DLCO COR %Predicted % 39  42    60   DLVA Predicted % 50  53  47   66   TLC L   4.48   5.31   TLC % Predicted %   88   105   RV % Predicted %   75   136     No results found for: "NITRICOXIDE"      Assessment & Plan:   Interstitial lung disease ([place]) Continue current regimen   Research study patient Continue study protocol.      Rexene Edison, NP 07/15/2022

## 2022-07-15 NOTE — Assessment & Plan Note (Signed)
Continue study protocol .   

## 2022-07-15 NOTE — Patient Instructions (Signed)
Continue research protocol  

## 2022-07-21 ENCOUNTER — Telehealth: Payer: Self-pay | Admitting: Podiatry

## 2022-07-21 DIAGNOSIS — Z1239 Encounter for other screening for malignant neoplasm of breast: Secondary | ICD-10-CM | POA: Diagnosis not present

## 2022-07-21 DIAGNOSIS — E559 Vitamin D deficiency, unspecified: Secondary | ICD-10-CM | POA: Diagnosis not present

## 2022-07-21 DIAGNOSIS — E1321 Other specified diabetes mellitus with diabetic nephropathy: Secondary | ICD-10-CM | POA: Diagnosis not present

## 2022-07-21 DIAGNOSIS — Z0000 Encounter for general adult medical examination without abnormal findings: Secondary | ICD-10-CM | POA: Diagnosis not present

## 2022-07-21 DIAGNOSIS — I10 Essential (primary) hypertension: Secondary | ICD-10-CM | POA: Diagnosis not present

## 2022-07-21 DIAGNOSIS — J4520 Mild intermittent asthma, uncomplicated: Secondary | ICD-10-CM | POA: Diagnosis not present

## 2022-07-21 DIAGNOSIS — G894 Chronic pain syndrome: Secondary | ICD-10-CM | POA: Diagnosis not present

## 2022-07-21 DIAGNOSIS — I6789 Other cerebrovascular disease: Secondary | ICD-10-CM | POA: Diagnosis not present

## 2022-07-21 NOTE — Telephone Encounter (Signed)
No answer no vm tried to reach pt to schedule her to pick up her diabetic shoes    Expires 10/10/22

## 2022-07-25 ENCOUNTER — Other Ambulatory Visit: Payer: Medicare HMO

## 2022-07-26 DIAGNOSIS — I639 Cerebral infarction, unspecified: Secondary | ICD-10-CM | POA: Diagnosis not present

## 2022-07-26 DIAGNOSIS — I6381 Other cerebral infarction due to occlusion or stenosis of small artery: Secondary | ICD-10-CM | POA: Diagnosis not present

## 2022-07-27 ENCOUNTER — Ambulatory Visit (INDEPENDENT_AMBULATORY_CARE_PROVIDER_SITE_OTHER): Payer: Medicare HMO

## 2022-07-27 DIAGNOSIS — Z794 Long term (current) use of insulin: Secondary | ICD-10-CM

## 2022-07-27 DIAGNOSIS — E1165 Type 2 diabetes mellitus with hyperglycemia: Secondary | ICD-10-CM

## 2022-07-27 NOTE — Progress Notes (Signed)
Patient presents today to pick up diabetic shoes and insoles.  Patient stated that the shoe felt too wide.   Shoe ordered - [id number] 9W New shoe ordered - P7300 27M

## 2022-08-03 DIAGNOSIS — Z95818 Presence of other cardiac implants and grafts: Secondary | ICD-10-CM | POA: Diagnosis not present

## 2022-08-03 DIAGNOSIS — Z4509 Encounter for adjustment and management of other cardiac device: Secondary | ICD-10-CM | POA: Diagnosis not present

## 2022-08-03 DIAGNOSIS — I639 Cerebral infarction, unspecified: Secondary | ICD-10-CM | POA: Diagnosis not present

## 2022-08-08 DIAGNOSIS — E119 Type 2 diabetes mellitus without complications: Secondary | ICD-10-CM | POA: Diagnosis not present

## 2022-08-08 DIAGNOSIS — N3946 Mixed incontinence: Secondary | ICD-10-CM | POA: Diagnosis not present

## 2022-08-08 NOTE — Research (Signed)
Late ENTERY  Title:A Randomized, Double-blind, Placebo-controlled, Phase 3 Study of the Efficacy and Safety of Inhaled Treprostinil in Subjects with Idiopathic Pulmonary Fibrosis   Dose and Duration of Treatment:Treprostinil for Inhalation 0.6 mg/mL, or placebo (randomly assigned 1:1) over a 52 week period.  Protocol # [id number]; IND # [id number]; Clinical Trials.gov Identifier: [id number]  Sponsor: [place], [place] [id number]  Protocol Version: Amendment 4 dated: 26Jul2023 IB: Version 18- [id number] ICF: Ver. 3.0, 13 Jan 2022, WCG Ver. 3.1, 10 Mar 2022 Lab Manual: V4 dated 07Jun2023  Investigator Brochure Product:  Treprostinil for Inhalation, 0.6 mg/mL  Key Features of the Drug: Treprostinil (LRX-15, 15AU81, UT-15) is a stable tricyclic benzindene analogue of the naturally occurring prostacyclin (PGI2; epoprostenol), a member of the eicosanoid family of autocoids; these compounds occur widely in tissues and have important pharmacological properties with potent activity, especially on the cardiovascular system and smooth muscle ([place]). An inhalation solution of treprostinil, 0.6 mg/mL, delivered using an ultrasonic nebulizer, is currently FDA approved to treat pulmonary arterial hypertension (PAH); [WHO Group 1, PH-ILD; WHO Group 3. Inhaled treprostinil is NOW being evaluated as primary treatment of IPF, idiopathic pulmonary fibrosis.   Key Inclusion Criteria: ?68 years of age Diagnosis of IPF based on the 2018 ATS/ERS/JRS/ALAT Clinical Practice Guideline (FVC ?45% predicted at Screening).  Subjects on pirfenidone or nintedanib must be on a stable and optimized dose for ?30 days prior to Baseline. Concomitant use of both pirfenidone and nintedanib is not permitted. Males with a partner of childbearing potential must use a condom for the duration of treatment and for at least 48 hours after discontinuing study drug.   Key Exclusion Criteria: Subject is  pregnant or lactating.  FEV1/FVC <0.70 at Screening.  Prior intolerance or significant lack of efficacy to a prostacyclin or prostacyclin analogue that resulted in discontinuation or inability to effectively titrate that therapy.  The subject has received any PAH-approved therapy, including prostacyclin therapy (epoprostenol, treprostinil, iloprost, or beraprost; except for acute vasoreactivity testing), IP receptor agonists (selexipag), endothelin receptor antagonists, phosphodiesterase type 5 inhibitors (PDE5-Is), or soluble guanylate cyclase stimulators within 60 days prior to Baseline. As needed use of a PDE5-I for erectile dysfunction is permitted, provided no doses are taken within 48 hours of any study-related efficacy assessments.  Use of any of the following medications:  `     a. Azathioprine (AZA), cyclosporine, mycophenolate mofetil, tacroliumus, oral           corticosteroids (OCS) >20 mg/day or the combination of            OCS+AZA+N-acetylcysteine within 30 days prior to Baseline.        b. Cyclophosphamide within 60 days prior to Baseline        c. Rituximab within 6 months prior to Baseline   Receiving >10 L/min of oxygen at rest at Baseline.   Exacerbation of IPF or active pulmonary or upper respiratory infection within 30 days prior to Baseline. Subjects must have completed any antibiotic or steroid regimens for treatment of the infection or acute exacerbation more than 30 days prior to Baseline to be eligible. If hospitalized for an acute exacerbation of IPF or a pulmonary or upper respiratory infection, subjects must have been discharged more than 90 days prior to Baseline to be eligible.  Uncontrolled cardiac disease, defined as myocardial infarction within 6 months prior to Baseline or unstable angina within 30 days prior to Baseline. Use of any investigational drug/device or participation in any investigational study in  which the subject received a medical intervention (ie,  procedure, device, medication/supplement) within 30 days prior to Screening. Subjects participating in non-interventional, observational, or registry studies are eligible.  Life expectancy <6 months due to IPF or a concomitant illness.  Acute pulmonary embolism within 90 days prior to Baseline.   Pharmacodynamics:  (Dose-Response Relationships) - In a clinical study of 241 healthy volunteers, single doses of Tyvaso 54 ?g (the target maintenance dose per session) and 84 ?g (supratherapeutic inhalation dose) prolonged the QTc interval by approximately 10 ms. The QTc effect dissipated rapidly as the concentration of treprostinil decreased.  Pharmacokinetics: (ADME) - Treprostinil is substantially metabolized by the liver, primarily by CYP2C8.  The elimination of treprostinil (following [place] administration of treprostinil) is biphasic, with a terminal elimination t1?2 of approximately 4 hours.    Contraindications:   None  Special Warnings/Considerations: Treprostinil undergoes substantial hepatic metabolism; thus, liver impairment could result in decreased metabolism and increased systemic exposure to treprostinil potentiating the occurrence and/or severity of AEs and/or overdose. There are no adequate and well-controlled studies with Tyvaso in pregnant women. There are risks to the mother and the fetus associated with PAH. Patients who discontinue the use of any effective therapy may be expected to notice a decline in their clinical status. Patients who abruptly discontinue therapy (ie, miss 2 or more doses), or markedly reduce the dose, are at risk for worsening of PAH symptoms or a rebound in [facility]. There is no evidence to suggest or suspect that patients taking inhaled treprostinil would be at risk for withdrawal symptoms or rebound effects.  Treprostinil inhibits platelet aggregation and increases the risk of bleeding, particularly with concomitant anticoagulants and antiplatelet  therapies. Treprostinil is a pulmonary and systemic vasodilator. In patients with low systemic arterial pressure, inhaled treprostinil solution may cause symptomatic hypotension, which may present as but is not limited to light-headedness, dizziness, blurred or faded vision, and syncope. Concomitant administration of treprostinil with diuretics, antihypertensive agents, or other vasodilators that may lower blood pressure increases the risk of symptomatic hypotension. Signs and symptoms of overdose with inhaled treprostinil solution may correspond to dose-limiting pharmacologic effects, including diarrhea, flushing, headache, hypotension, nausea, and vomiting. Most events are self-limiting and resolve with reduction or withholding of inhaled treprostinil solution.  Interactions:              Drug Interaction Studies Conducted with Treprostinil: Study # P01:08: Treprostinil formulation [place] Remodulin vs Acetaminophen, to evaluate effect of acetaminophen on Treprostinil pK, showed no effect. Study # P01:12: Treprostinil formulation [place] Remodulin vs Warfarin, to evaluate effect of Warfarin on Treprostinil pK/PD, showed no effect. Study TDE-PH-105: Treprostinil formulation Orenitram vs Bosentan, to evaluate effect of Bosentan on Treprostinil pK, showed no effect. Study TDE-PH-106: Treprostinil formulation Orenitram vs Sildenafil, to evaluate effect of Sildenafil on Treprostinil pK, showed no effect. Study TDE-PH-109: Treprostinil formulation Orenitram vs Rifampicin (CYP2C8/9 inducer), to evaluate the effect of inducing the Q000111Q metabolic pathway on the pK of Treprostinil, confirmed metabolic pathway via [id number]. Expected interaction resulting in reduced Cmax and AUC. Study TDE-PH-110: Treprostinil formulation Orenitram vs Gemfibrozil (CYP2C8  inhibitor), to evaluate the effect of inhibiting the Q000111Q metabolic pathway on the pK of Treprostinil, confirmed metabolic pathway primarily via CYP2C8. Expected  interaction resulting in significantly increased Cmax and AUC. Study TDE-PH-110: Treprostinil formulation Orenitram vs Fluconazole (CYP2C9 inhibitor), to evaluate the effect of inhibiting the 0000000 metabolic pathway on on the pK of Treprostinil, had no notable effect. Confirmed CYP2C9 plays a minor role in treprostinil metabolism.  Serious Adverse Reactions Observed in Clinical Studies with Inhaled Treprostinil: Pulmonary Arterial Hypertension: all occurrences from RIN-PH-301 study for Pulmonary Arterial Hypertension. No serious adverse reactions met criteria for inclusion in inhaled treprostinil solution clinical studies for interstitial lung disease. System/Organ Class: Nervous System disorders - Syncope in 3 of 115 subjects exposed (2.6%)      Serious Adverse Reactions Observed Postmarketing: Gastrointestinal disorders: vomiting, nausea, diarrhea, GI Hemorrhage (reflects an aggregate of the following: GI hemorrhage, rectal hemorrhage, blood in stool [hematochezia or melena], and hematemesis). General disorders and administration site conditions: Pain Infections and infestations: Pneumonia Nervous system disorders: Syncope, dizziness, headache Respiratory, thoracic and mediastinal disorders: Hemoptysis, cough, epistaxis Vascular disorders: Hypotension, hemorrhage (unspecified blood loss ranging from nondescript to heavy or excessive bleeding).  Safety Data:  As of 28 December 2020, approximately 955 subjects have received treprostinil by inhalation across various studies, ranging from acute studies in healthy volunteers to long-term treatment in subjects with PAH and PH-ILD.   The estimate of worldwide exposure to treprostinil in the marketed inhaled formulations is approximately [id number] total patient treatment-days (approximately 34,318 patient treatment-years). The most common adverse effects of inhaled treprostinil ?10% were cough, diarrhea, dizziness, flushing, headache, muscle/jaw/bone pain,  nausea, pharyngolaryngeal pain, oropharyngeal pain, syncope, and throat irritation. Other adverse reactions in the observational study comparing subjects taking inhaled treprostinil and a control group were cough, hemoptysis, nasal discomfort, and throat irritation. Angioedema is a rare but serious reaction that has been seen in the post-marketing setting with treprostinil.  Stability:  Ampoules of drug product are stable until the date indicated when stored in the unopened foil pouch. The [place] commercial labeling states the following regarding storage conditions: Store at [place] to [place] (14F to 53F), with excursions permitted to [id number] to [id number] (64F to 74F).  Once the foil pack is opened, ampoules should be used within 7 days. Because Tyvaso is light-sensitive, unopened ampoules should be stored in the foil pouch.  [facility] [place] Coordinator note:   This visit for Subject Erica [name] with DOB: 1955/06/28 on 02Feb2024 for the above protocol is Visit/Encounter # Week 28  and is for purpose of research.    Protocol Version: Amendment 4 dated: 26Jul2023 IB: Version 18- [id number] ICF: Ver. 3.0, 13 Jan 2022, WCG Ver. 3.1, 10 Mar 2022 Lab Manual: V4 dated 07Jun2023  Subject expressed continued interest and consent in continuing as a study subject. Subject confirmed that there was no change in contact information (e.g. address, telephone, email). Subject thanked for participation in research and contribution to science. In this visit 02Feb2024 the subject will be evaluated by Sub-Investigator named Rexene Edison, NP. This research coordinator has verified that the above investigator is up to date with his/her training logs. Subject completed all the assessments for week 28 per the protocol, details are found in subject study binder.  Subject also used the business [employer] account to get to and from the visit. Subject appreciates this service. Next visit was discussed and scheduled.   The  Subject was informed that the PI Brand Males, MD continues to have oversight of the subject's visits and course through relevant discussions, reviews, and also specifically of this visit by routing of this note to the PI.       Signed by  Jaye Beagle RN  MSN/MBA  Clinical Research Coordinator / Nurse [facility]  [place], [place] 3:49 PM 08/08/2022

## 2022-08-10 ENCOUNTER — Ambulatory Visit (INDEPENDENT_AMBULATORY_CARE_PROVIDER_SITE_OTHER): Payer: Medicare HMO | Admitting: Podiatry

## 2022-08-10 ENCOUNTER — Ambulatory Visit (INDEPENDENT_AMBULATORY_CARE_PROVIDER_SITE_OTHER): Payer: Medicare HMO

## 2022-08-10 DIAGNOSIS — L89891 Pressure ulcer of other site, stage 1: Secondary | ICD-10-CM

## 2022-08-10 DIAGNOSIS — Z794 Long term (current) use of insulin: Secondary | ICD-10-CM | POA: Diagnosis not present

## 2022-08-10 DIAGNOSIS — E1165 Type 2 diabetes mellitus with hyperglycemia: Secondary | ICD-10-CM | POA: Diagnosis not present

## 2022-08-10 DIAGNOSIS — L84 Corns and callosities: Secondary | ICD-10-CM

## 2022-08-10 NOTE — Progress Notes (Signed)
Patient presents today to pick up diabetic shoes and insoles.  Patient was dispensed 1 pair of diabetic shoes and 3 pairs of foam casted diabetic insoles.   She tried on the shoes with the insoles and the fit was satisfactory.   Will follow up next year for new order.

## 2022-08-10 NOTE — Progress Notes (Signed)
Subjective:  Patient ID: Erica [name], female    DOB: 1956/02/08,  MRN: [id number]  Chief Complaint  Patient presents with   Toe Pain    67 y.o. female presents with the above complaint.  Patient presents with left distal hallux stage I pressure injury.  Patient states there is pain to the distal toe.  Has been on for quite some time is progressive gotten worse.  She states it came out of nowhere.  She went to get it evaluated she is a diabetic.  She is picking up her diabetic shoes today as well   Review of Systems: Negative except as noted in the HPI. Denies N/V/F/Ch.  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 12/16/2020   Mental disorder    depression   Neuropathy    feet    Pain    arthritis pain - takes tramadol as needed   Rectal polyp    very little bleeding with bowel movements- no pain   Stroke ([place]) 11/03/2020   Suicide attempt ([place])     Current Outpatient Medications:    acetaminophen (TYLENOL) 325 MG tablet, Take 2 tablets (650 mg total) by mouth every 4 (four) hours as needed for mild pain (or temp > 37.5 C (99.5 F))., Disp: , Rfl:    albuterol (VENTOLIN HFA) 108 (90 Base) MCG/ACT inhaler, Inhale 2 puffs into the lungs every 4 (four) hours as needed for wheezing., Disp: 1 g, Rfl: 5   amLODipine (NORVASC) 10 MG tablet, Take 1 tablet (10 mg total) by mouth daily., Disp: 30 tablet, Rfl: 2   aspirin EC 81 MG EC tablet, Take 1 tablet (81 mg total) by mouth daily. Swallow whole., Disp: 30 tablet, Rfl: 11   famotidine (PEPCID) 20 MG tablet, Take 1 tablet (20 mg total) by mouth daily., Disp: 30 tablet, Rfl: 0   fenofibrate (TRICOR) 145 MG tablet, TAKE 1 TABLET(145 MG) BY MOUTH DAILY (Patient taking differently: Take 145 mg by mouth  daily.), Disp: 90 tablet, Rfl: 3   gabapentin (NEURONTIN) 100 MG capsule, Take 2 capsules (200 mg total) by mouth 2 (two) times daily as needed (pain). (Patient taking differently: Take 100 mg by mouth 2 (two) times daily.), Disp: 60 capsule, Rfl: 0   hydrALAZINE (APRESOLINE) 25 MG tablet, Take 25 mg by mouth in the morning and at bedtime., Disp: , Rfl:    insulin glargine (LANTUS SOLOSTAR) 100 UNIT/ML Solostar Pen, Please provide 24 units every a.m. and 20 units every p.m. (Patient taking differently: Inject 18-24 Units into the skin See admin instructions. 24 units in the morning 18 units at bedtime), Disp: 15 mL, Rfl: 11   Insulin Pen Needle (PEN NEEDLES) 32G X 6 MM MISC, 1 application by Does not apply route 2 (two) times daily., Disp: 100 each, Rfl: 1   Multiple Vitamin (MULTIVITAMIN ADULT PO), Take 1 tablet by mouth daily., Disp: , Rfl:    oxyCODONE (ROXICODONE) 5 MG immediate release tablet, Take 1 tablet (5 mg total) by mouth every 4 (four) hours as needed for severe pain., Disp: 9 tablet, Rfl: 0   pantoprazole (PROTONIX) 40 MG tablet, Take 40 mg by mouth daily., Disp: , Rfl:  Pirfenidone 801 MG TABS, TAKE 1 TABLET (801 MG) BY MOUTH WITH BREAKFAST, WITH LUNCH, AND WITH EVENING MEAL., Disp: 90 tablet, Rfl: 1   rosuvastatin (CRESTOR) 20 MG tablet, Take 1 tablet (20 mg total) by mouth daily., Disp: 90 tablet, Rfl: 1   sacubitril-valsartan (ENTRESTO) 49-51 MG, TAKE 1 TABLET BY MOUTH TWICE DAILY, Disp: 180 tablet, Rfl: 1   sertraline (ZOLOFT) 50 MG tablet, Take 50 mg by mouth daily., Disp: , Rfl:    SYMBICORT 160-4.5 MCG/ACT inhaler, INHALE 2 PUFFS INTO THE LUNGS TWICE DAILY, Disp: 10.2 g, Rfl: 5   Tiotropium Bromide Monohydrate (SPIRIVA RESPIMAT) 1.25 MCG/ACT AERS, Inhale 2 puffs into the lungs daily., Disp: 4 g, Rfl: 5   venlafaxine XR (EFFEXOR-XR) 150 MG 24 hr capsule, Take 150 mg by mouth daily., Disp: , Rfl:   Social History   Tobacco Use  Smoking Status Former   Packs/day: 0.50    Years: 20.00   Total pack years: 10.00   Types: Cigarettes   Quit date: 01/05/2018   Years since quitting: 4.5  Smokeless Tobacco Never  Tobacco Comments   Unknown     Allergies  Allergen Reactions   Penicillins Hives, Itching and Swelling    Tongue swells up Has patient had a PCN reaction causing immediate rash, facial/tongue/throat swelling, SOB or lightheadedness with hypotension: Yes Has patient had a PCN reaction causing severe rash involving mucus membranes or skin necrosis: Yes Has patient had a PCN reaction that required hospitalization Yes Has patient had a PCN reaction occurring within the last 10 years: No If all of the above answers are "NO", then may proceed with Cephalosporin use.    Objective:  There were no vitals filed for this visit. There is no height or weight on file to calculate BMI. Constitutional Well developed. Well nourished.  Vascular Dorsalis pedis pulses palpable bilaterally. Posterior tibial pulses palpable bilaterally. Capillary refill normal to all digits.  No cyanosis or clubbing noted. Pedal hair growth normal.  Neurologic Normal speech. Oriented to person, place, and time. Epicritic sensation to light touch grossly present bilaterally.  Dermatologic Left hallux distal tip pressure injury very minimal.  No breakdown of skin noted.  No infection noted no wound noted  Orthopedic: Normal joint ROM without pain or crepitus bilaterally. No visible deformities. No bony tenderness.   Radiographs: None Assessment:   1. Pressure injury of toe of left foot, stage 1    Plan:  Patient was evaluated and treated and all questions answered.  Left hallux distal pressure injury -All questions and concerns were discussed with the patient in extensive detail.  I discussed shoe gear modification but I imagine given that she just got her diabetic shoes that we will give her ample reduction in pressure to the distal tip of the hallux.  She states  understanding if it continues to bother her or regresses she will come back and see me. -Diabetic shoes were dispensed  No follow-ups on file.

## 2022-08-12 ENCOUNTER — Other Ambulatory Visit: Payer: Self-pay | Admitting: Internal Medicine

## 2022-08-12 DIAGNOSIS — Z1231 Encounter for screening mammogram for malignant neoplasm of breast: Secondary | ICD-10-CM

## 2022-08-22 ENCOUNTER — Other Ambulatory Visit: Payer: Self-pay | Admitting: Internal Medicine

## 2022-08-22 DIAGNOSIS — J84112 Idiopathic pulmonary fibrosis: Secondary | ICD-10-CM

## 2022-08-22 DIAGNOSIS — Z5181 Encounter for therapeutic drug level monitoring: Secondary | ICD-10-CM

## 2022-08-22 NOTE — Telephone Encounter (Signed)
Refill sent for PIRFENIDONE to [facility]: [phone]  Dose: 801 mg three times daily  Last OV: 07/15/22 (with [facility]) Provider: Dr Chase Caller  Next OV: 10/06/22  Knox Saliva, PharmD, MPH, BCPS Clinical Pharmacist (Rheumatology and Pulmonology)

## 2022-08-26 DIAGNOSIS — E1321 Other specified diabetes mellitus with diabetic nephropathy: Secondary | ICD-10-CM | POA: Diagnosis not present

## 2022-08-26 DIAGNOSIS — R071 Chest pain on breathing: Secondary | ICD-10-CM | POA: Diagnosis not present

## 2022-08-26 DIAGNOSIS — I10 Essential (primary) hypertension: Secondary | ICD-10-CM | POA: Diagnosis not present

## 2022-08-26 DIAGNOSIS — J4520 Mild intermittent asthma, uncomplicated: Secondary | ICD-10-CM | POA: Diagnosis not present

## 2022-08-29 DIAGNOSIS — E1140 Type 2 diabetes mellitus with diabetic neuropathy, unspecified: Secondary | ICD-10-CM | POA: Diagnosis not present

## 2022-08-31 DIAGNOSIS — R0789 Other chest pain: Secondary | ICD-10-CM | POA: Diagnosis not present

## 2022-09-03 DIAGNOSIS — I639 Cerebral infarction, unspecified: Secondary | ICD-10-CM | POA: Diagnosis not present

## 2022-09-03 DIAGNOSIS — Z4509 Encounter for adjustment and management of other cardiac device: Secondary | ICD-10-CM | POA: Diagnosis not present

## 2022-09-03 DIAGNOSIS — Z95818 Presence of other cardiac implants and grafts: Secondary | ICD-10-CM | POA: Diagnosis not present

## 2022-09-07 ENCOUNTER — Telehealth: Payer: Self-pay
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
## 2022-09-08 DIAGNOSIS — E119 Type 2 diabetes mellitus without complications: Secondary | ICD-10-CM | POA: Diagnosis not present

## 2022-09-08 DIAGNOSIS — N3946 Mixed incontinence: Secondary | ICD-10-CM | POA: Diagnosis not present

## 2022-09-09 DIAGNOSIS — J302 Other seasonal allergic rhinitis: Secondary | ICD-10-CM | POA: Diagnosis not present

## 2022-09-09 DIAGNOSIS — E1321 Other specified diabetes mellitus with diabetic nephropathy: Secondary | ICD-10-CM | POA: Diagnosis not present

## 2022-09-09 DIAGNOSIS — I10 Essential (primary) hypertension: Secondary | ICD-10-CM | POA: Diagnosis not present

## 2022-09-09 DIAGNOSIS — F341 Dysthymic disorder: Secondary | ICD-10-CM | POA: Diagnosis not present

## 2022-09-09 DIAGNOSIS — R071 Chest pain on breathing: Secondary | ICD-10-CM | POA: Diagnosis not present

## 2022-09-13 DIAGNOSIS — E1140 Type 2 diabetes mellitus with diabetic neuropathy, unspecified: Secondary | ICD-10-CM | POA: Diagnosis not present

## 2022-09-22 DIAGNOSIS — R6889 Other general symptoms and signs: Secondary | ICD-10-CM | POA: Diagnosis not present

## 2022-09-29 ENCOUNTER — Ambulatory Visit: Payer: Medicare HMO | Admitting: Cardiology

## 2022-09-29 ENCOUNTER — Encounter: Payer: Self-pay | Admitting: Cardiology

## 2022-09-29 VITALS — BP 148/85 | HR 73 | Resp 18 | Ht 63.0 in | Wt 169.0 lb

## 2022-09-29 DIAGNOSIS — Z794 Long term (current) use of insulin: Secondary | ICD-10-CM

## 2022-09-29 DIAGNOSIS — E1122 Type 2 diabetes mellitus with diabetic chronic kidney disease: Secondary | ICD-10-CM | POA: Diagnosis not present

## 2022-09-29 DIAGNOSIS — E781 Pure hyperglyceridemia: Secondary | ICD-10-CM

## 2022-09-29 DIAGNOSIS — I2584 Coronary atherosclerosis due to calcified coronary lesion: Secondary | ICD-10-CM | POA: Diagnosis not present

## 2022-09-29 DIAGNOSIS — R0602 Shortness of breath: Secondary | ICD-10-CM | POA: Diagnosis not present

## 2022-09-29 DIAGNOSIS — I351 Nonrheumatic aortic (valve) insufficiency: Secondary | ICD-10-CM

## 2022-09-29 DIAGNOSIS — I129 Hypertensive chronic kidney disease with stage 1 through stage 4 chronic kidney disease, or unspecified chronic kidney disease: Secondary | ICD-10-CM | POA: Diagnosis not present

## 2022-09-29 DIAGNOSIS — I2510 Atherosclerotic heart disease of native coronary artery without angina pectoris: Secondary | ICD-10-CM

## 2022-09-29 DIAGNOSIS — Z8673 Personal history of transient ischemic attack (TIA), and cerebral infarction without residual deficits: Secondary | ICD-10-CM

## 2022-09-29 DIAGNOSIS — E782 Mixed hyperlipidemia: Secondary | ICD-10-CM

## 2022-09-29 DIAGNOSIS — N183 Chronic kidney disease, stage 3 unspecified: Secondary | ICD-10-CM

## 2022-09-29 DIAGNOSIS — Z95818 Presence of other cardiac implants and grafts: Secondary | ICD-10-CM | POA: Diagnosis not present

## 2022-09-29 DIAGNOSIS — N1832 Chronic kidney disease, stage 3b: Secondary | ICD-10-CM

## 2022-09-29 NOTE — Progress Notes (Signed)
Erica [name] Date of Birth: July 31, 1955 MRN: [id number] Primary Care Provider:Avbuere, Dorma Russell, MD Former Cardiology Providers: Dr. Yates Decamp, [facility], APRN, FNP-C Primary Cardiologist: Tessa Lerner, DO, [facility] (established care 11/19/2019)  Date: 09/29/22 Last Office Visit: 03/31/2022  Chief Complaint  Patient presents with   Shortness of Breath   Follow-up    6 months    HPI   Erica [name] is a 67 y.o.  female whose past medical history and cardiovascular risk factors include: Hx of stroke involving the right caudate nucleus and right anterolateral thalamus (Jan 2022), Hx of  right parietooccipital stroke (May 2022), s/p loop recorder implant, rectal CA in 2014 that is in remission, T2DM, former tobacco use, emphysema, interstitial lung disease, idiopathic pulmonary fibrosis, and coronary artery calcification on CT scan.    In 2022 she had 2 strokes within a relatively short period she was referred to cardiology for full stroke workup.  She underwent loop recorder implant in July 2022 and no documented episodes of A-fib thereafter.  She presents today for 57-month follow-up visit.  Her shortness of breath is stable since last office visit.  Blood pressures at home around 130 mmHg according to her and she is currently enrolled into clinical trial with pulmonary medicine given her pulmonary fibrosis.  Patient states that she had labs with PCP approximately 1 to 2 months ago.  I do not have them available for review.  She used to walk with a four-wheel walker and now is using a cane.  Overall functional capacity is slowly improving.  ALLERGIES: Allergies  Allergen Reactions   Penicillins Hives, Itching and Swelling    Tongue swells up Has patient had a PCN reaction causing immediate rash, facial/tongue/throat swelling, SOB or lightheadedness with hypotension: Yes Has patient had a PCN reaction causing severe rash involving mucus membranes or skin necrosis: Yes Has patient had a PCN  reaction that required hospitalization Yes Has patient had a PCN reaction occurring within the last 10 years: No If all of the above answers are "NO", then may proceed with Cephalosporin use.      MEDICATION LIST PRIOR TO VISIT: Current Outpatient Medications on File Prior to Visit  Medication Sig Dispense Refill   acetaminophen (TYLENOL) 325 MG tablet Take 2 tablets (650 mg total) by mouth every 4 (four) hours as needed for mild pain (or temp > 37.5 C (99.5 F)).     albuterol (VENTOLIN HFA) 108 (90 Base) MCG/ACT inhaler Inhale 2 puffs into the lungs every 4 (four) hours as needed for wheezing. 1 g 5   amLODipine (NORVASC) 10 MG tablet Take 1 tablet (10 mg total) by mouth daily. 30 tablet 2   aspirin EC 81 MG EC tablet Take 1 tablet (81 mg total) by mouth daily. Swallow whole. 30 tablet 11   famotidine (PEPCID) 20 MG tablet Take 1 tablet (20 mg total) by mouth daily. 30 tablet 0   fenofibrate (TRICOR) 145 MG tablet TAKE 1 TABLET(145 MG) BY MOUTH DAILY (Patient taking differently: Take 145 mg by mouth daily.) 90 tablet 3   gabapentin (NEURONTIN) 100 MG capsule Take 2 capsules (200 mg total) by mouth 2 (two) times daily as needed (pain). (Patient taking differently: Take 100 mg by mouth 2 (two) times daily.) 60 capsule 0   hydrALAZINE (APRESOLINE) 50 MG tablet Take 50 mg by mouth in the morning and at bedtime.     insulin glargine (LANTUS SOLOSTAR) 100 UNIT/ML Solostar Pen Please provide 24 units every a.m. and  20 units every p.m. (Patient taking differently: Inject 18-24 Units into the skin See admin instructions. 24 units in the morning 18 units at bedtime) 15 mL 11   Insulin Pen Needle (PEN NEEDLES) 32G X 6 MM MISC 1 application by Does not apply route 2 (two) times daily. 100 each 1   Multiple Vitamin (MULTIVITAMIN ADULT PO) Take 1 tablet by mouth daily.     pantoprazole (PROTONIX) 40 MG tablet Take 40 mg by mouth daily.     Pirfenidone 801 MG TABS TAKE 1 TABLET (801 MG) BY MOUTH WITH  BREAKFAST, WITH LUNCH, AND WITH EVENING MEAL. 270 tablet 0   rosuvastatin (CRESTOR) 20 MG tablet Take 1 tablet (20 mg total) by mouth daily. 90 tablet 1   sacubitril-valsartan (ENTRESTO) 49-51 MG TAKE 1 TABLET BY MOUTH TWICE DAILY 180 tablet 1   sertraline (ZOLOFT) 50 MG tablet Take 50 mg by mouth daily.     SYMBICORT 160-4.5 MCG/ACT inhaler INHALE 2 PUFFS INTO THE LUNGS TWICE DAILY 10.2 g 5   Tiotropium Bromide Monohydrate (SPIRIVA RESPIMAT) 1.25 MCG/ACT AERS Inhale 2 puffs into the lungs daily. 4 g 5   venlafaxine XR (EFFEXOR-XR) 150 MG 24 hr capsule Take 150 mg by mouth daily.     No current facility-administered medications on file prior to visit.    PAST MEDICAL HISTORY: Past Medical History:  Diagnosis Date   Arthritis    especially in shoulders   Asthma    Depression    Diabetes mellitus    Embolic stroke 11/03/2020   GERD (gastroesophageal reflux disease)    Headache(784.0)    "mild"   Hypertension    Loop Biotronik loop implant 12/16/2020 12/16/2020   Mental disorder    depression   Neuropathy    feet    Pain    arthritis pain - takes tramadol as needed   Rectal polyp    very little bleeding with bowel movements- no pain   Stroke 11/03/2020   Suicide attempt     PAST SURGICAL HISTORY: Past Surgical History:  Procedure Laterality Date   ANAL RECTAL MANOMETRY N/A 07/13/2016   Procedure: ANO RECTAL MANOMETRY;  Surgeon: Romie Levee, MD;  Location: [facility];  Service: Endoscopy;  Laterality: N/A;   CESAREAN SECTION     EUS N/A 11/21/2012   Procedure: LOWER ENDOSCOPIC ULTRASOUND (EUS);  Surgeon: Willis Modena, MD;  Location: [facility];  Service: Endoscopy;  Laterality: N/A;   FLEXIBLE SIGMOIDOSCOPY N/A 11/21/2012   Procedure: FLEXIBLE SIGMOIDOSCOPY;  Surgeon: Willis Modena, MD;  Location: [facility];  Service: Endoscopy;  Laterality: N/A;   FLEXIBLE SIGMOIDOSCOPY N/A 01/28/2013   Procedure: FLEXIBLE SIGMOIDOSCOPY;  Surgeon: Romie Levee, MD;  Location: [facility];  Service: Endoscopy;  Laterality: N/A;   LAPAROSCOPIC LOW ANTERIOR RESECTION N/A 01/29/2013   Procedure: LAPAROSCOPIC LOW ANTERIOR RESECTION, Rigid Proctoscopy;  Surgeon: Romie Levee, MD;  Location: [facility];  Service: General;  Laterality: N/A;   LAPAROSCOPIC SIGMOID COLECTOMY N/A 11/14/2012   Procedure: DIAGNOSTIC LAPAROSCOPY AND SIGMOIDMOIDOSCOPY ;  Surgeon: Emelia Loron, MD;  Location: [facility];  Service: General;  Laterality: N/A;   RECTAL ULTRASOUND N/A 07/13/2016   Procedure: RECTAL ULTRASOUND;  Surgeon: Romie Levee, MD;  Location: [facility];  Service: Endoscopy;  Laterality: N/A;   TONSILLECTOMY     TONSILLECTOMY AND ADENOIDECTOMY      FAMILY HISTORY: The patient's family history includes Cancer in her maternal aunt, maternal uncle, maternal uncle, maternal uncle, paternal aunt, and paternal uncle; Cancer (age of onset:  47) in an other family member; Diabetes in her father; Heart disease in her mother; Hyperlipidemia in her mother; Hypertension in her brother, brother, father, mother, sister, and sister; Stroke in her father.   SOCIAL HISTORY:  The patient  reports that she quit smoking about 4 years ago. Her smoking use included cigarettes. She has a 10.00 pack-year smoking history. She has never used smokeless tobacco. She reports that she does not currently use alcohol. She reports that she does not currently use drugs after having used the following drugs: "Crack" cocaine.  Review of Systems  Constitutional: Negative for malaise/fatigue.  Cardiovascular:  Negative for chest pain, dyspnea on exertion, leg swelling, near-syncope, orthopnea, palpitations, paroxysmal nocturnal dyspnea and syncope.  Respiratory:  Negative for shortness of breath.     PHYSICAL EXAM:    09/29/2022   11:32 AM 09/29/2022   10:34 AM 06/21/2022   10:16 AM  Vitals with BMI  Height  5\' 3"    Weight  169 lbs   BMI  29.94   Systolic 148 159 960  Diastolic 85 87 66  Pulse 73 71 81     CONSTITUTIONAL: Appears older than stated age, hemodynamically stable, well-nourished. Ambulates with cane  SKIN: Skin is warm and dry. No rash noted. No cyanosis. No pallor. No jaundice HEAD: Normocephalic and atraumatic.  EYES: No scleral icterus MOUTH/THROAT: Moist oral membranes.  NECK: No JVD present. No thyromegaly noted. No carotid bruits  CHEST Normal respiratory effort. No intercostal retractions  LUNGS: Clear to auscultation in the upper lung fields with minimal dry crackles at the bilateral bases.   CARDIOVASCULAR: Regular rate and rhythm, positive S1-S2, no murmurs rubs or gallops appreciated. ABDOMINAL: Obese, soft, nontender, nondistended, positive bowel sounds all 4 quadrants. No apparent ascites.  EXTREMITIES: No peripheral edema. Left ankle brace. HEMATOLOGIC: No significant bruising NEUROLOGIC: Oriented to person, place, and time.  5 out of 5 strength in the right upper and lower extremity.  4 out of 5 strength in the left upper and lower extremity. PSYCHIATRIC: Normal mood and affect. Normal behavior. Cooperative  RADIOLOGY: CT of chest 04/12/2019:  1. The appearance of the lungs remains compatible with interstitial lung disease, with a spectrum of findings considered diagnostic of usual interstitial pneumonia (UIP) per current ATS guidelines. Minimal progression compared to the prior study. 2. Aortic atherosclerosis, in addition to left anterior descending coronary artery disease.  3. Cholelithiasis.  Aortic Atherosclerosis (ICD10-I70.0).  CT head without contrast 06/25/2020: 1. Lacunar infarcts in the right caudate and right cerebellum, new from 2012, but age indeterminate. 2. No hemorrhage or evidence of large or territorial ischemia. 3. Chronic left sphenoid sinusitis.  MRI brain without contrast 06/25/2020: 1. 1 cm acute infarction in the anterolateral thalamus on the right, possibly with some involvement of the posterior limb internal capsule. Acute infarction  within the right caudate head. Scattered other foci of high signal on diffusion imaging within the hemispheric white matter, not showing restricted diffusion on the ADC map, and therefore probably representing T2 shine through from old white matter infarctions. 2. Extensive old small vessel infarctions throughout the brain as outlined above. Small old cortical infarctions in the right frontal lobe and both parieto-occipital regions. 3. No large or medium vessel occlusion or correctable proximal stenosis. Mild atherosclerotic irregularity of the PCA branches.  MR cervical spine without contrast 06/26/2020 1. 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 Mild generalized atrophy of the upper cervical spinal cord. 2. Moderate C6-7 and mild  C5-6 and C7-T1 spinal canal stenosis secondary to central disc protrusions.   MR Brain Wo Contrast (neuro protocol) Result Date: 11/03/2020 IMPRESSION: Newly seen punctate acute infarction at the medial right parietooccipital junction. No other change since the most recent exam of January. Chronic small-vessel ischemic changes of the pons, cerebellum, thalami, basal ganglia and hemispheric white matter. Old cortical and subcortical infarctions in the occipital regions and frontoparietal regions.  CARDIAC DATABASE: EKG: September 29, 2022: Sinus rhythm, 64 bpm, left axis, left anterior fascicular block, consider old anterior infarct, consider lateral ischemia.  Without underlying injury pattern.  No significant change compared to 03/31/2022.  Echocardiogram: 11/04/2020:  LVEF 60-65%, moderate LVH, grade 1 diastolic impairment, elevated LAP, normal right ventricular size and function, mild AR.  08/30/2021: Left ventricle cavity is normal in size. Moderate concentric hypertrophy of the left ventricle. Normal LV systolic function with EF 56%. Normal global wall motion. Doppler evidence of grade I (impaired) diastolic dysfunction,  normal LAP.  Left atrial cavity is mildly dilated. Trileaflet aortic valve. Mild aortic valve leaflet calcification. Moderate (Grade II) aortic regurgitation. Trace pericardial effusion. No significant change compared to previous study on 07/16/2019.   Stress Testing:  Lexiscan tetrofosmin stress test 07/22/2019: There is  apical thinning an mild soft tissue attenuation artifact in inferior wall without ischemia or infarct. All segments of left ventricle demonstrated normal wall motion and thickening. Stress LV EF is moderately dysfunctional 39%, however visually appearted to be normal. No previous exam available for comparison. Low risk study.   Carotid Duplex 11/05/2020: Right Carotid: The extracranial vessels were near-normal with only minimal wall thickening or plaque.  Left Carotid: The extracranial vessels were near-normal with only minimal wall thickening or plaque.  Vertebrals: Bilateral vertebral arteries demonstrate antegrade flow.   Heart Catheterization: None  14 day extended Holter monitor: Dominant rhythm normal sinus rhythm Heart rate 44-179 bpm.  Avg HR 69 bpm. No atrial fibrillation, high grade AV block, pauses (3 seconds or longer). 1 episode of nonsustained ventricular tachycardia, 13 beats in duration, max heart rate of 179 bpm. 1 episode of supraventricular tachycardia, 6 beats in duration, maximum heart rate 164bpm. Total ventricular ectopic burden <1%. Total supraventricular ectopic burden <1%. Patient triggered events: 0.  Biotronik loop recorder implantation 12/16/2020  Remote loop recorder transmission 09/03/2022: Atrial monitoring episodes = normal sinus rhythm.  Rare PACs.  No symptoms reported.  No atrial fibrillation, no pauses.  LABORATORY DATA:    Latest Ref Rng & Units 01/08/2022    3:00 PM 11/22/2021    6:30 PM 11/22/2021    6:06 PM  CBC  WBC 4.0 - 10.5 K/uL 3.9   4.0   Hemoglobin 12.0 - 15.0 g/dL 16.1  09.6  04.5   Hematocrit 36.0 - 46.0 % 31.7  30.0   30.1   Platelets 150 - 400 K/uL 185   198        Latest Ref Rng & Units 01/12/2022   12:18 PM 01/08/2022    3:00 PM 11/22/2021    6:30 PM  CMP  Glucose 65 - 99 mg/dL 409  811  84   BUN 7 - 25 mg/dL 32  23  26   Creatinine 0.50 - 1.05 mg/dL 9.14  7.82  9.56   Sodium 135 - 146 mmol/L 140  141  143   Potassium 3.5 - 5.3 mmol/L 4.7  4.1  4.3   Chloride 98 - 110 mmol/L 110  111  109   CO2 20 - 32 mmol/L  23  23    Calcium 8.6 - 10.4 mg/dL 9.3  9.6      Lipid Panel  Lab Results  Component Value Date   CHOL 128 07/16/2021   HDL 82 07/16/2021   LDLCALC 31 07/16/2021   LDLDIRECT 37 03/08/2021   TRIG 71 07/16/2021   CHOLHDL 1.6 07/16/2021    Lab Results  Component Value Date   HGBA1C 6.3 (H) 11/04/2020   HGBA1C 5.2 06/26/2020   HGBA1C 7.9 (H) 05/11/2019   No components found for: "NTPROBNP" Lab Results  Component Value Date   TSH 2.01 07/16/2021   TSH 3.642 06/26/2020   TSH 3.53 06/25/2020    Cardiac Panel (last 3 results) No results for input(s): "CKTOTAL", "CKMB", "TROPONINIHS", "RELINDX" in the last 72 hours.  IMPRESSION:    ICD-10-CM   1. Shortness of breath  R06.02 EKG 12-Lead    PCV ECHOCARDIOGRAM COMPLETE    2. Coronary atherosclerosis due to calcified coronary lesion of native artery  I25.10 EKG 12-Lead   I25.84     3. Hx of stroke involving the right caudate nucleus and right anterolateral thalamus (Jan 2022), Hx of  right parietooccipital stroke (May 2022)  Z86.73     4. Hypertriglyceridemia  E78.1     5. Benign hypertension with CKD (chronic kidney disease) stage III  I12.9    N18.30     6. Type 2 diabetes mellitus with stage 3b chronic kidney disease, with long-term current use of insulin  E11.22    N18.32    Z79.4     7. Long-term insulin use  Z79.4     8. Mixed hyperlipidemia  E78.2     9. Status post placement of implantable loop recorder  Z95.818     10. Nonrheumatic aortic valve insufficiency  I35.1 PCV ECHOCARDIOGRAM COMPLETE        RECOMMENDATIONS: Erica [name] is a 67 y.o. female whose past medical history and cardiovascular risk factors include: Recent stroke involving the right caudate nucleus and right anterolateral thalamus (March 2022), rectal CA in 2014 that is in remission, T2DM, former tobacco use, emphysema, interstitial lung disease, idiopathic pulmonary fibrosis, and coronary artery calcification on CT scan.    Shortness of breath Remained stable since last office visit. All of his blood pressures are within acceptable limits, home blood pressures are better controlled Has done well with initiation of Entresto given her symptoms and diastolic dysfunction on prior echocardiography. Pulmonary fibrosis currently being managed by pulmonary medicine and is enrolled into clinical trial, per patient Most recent loop recorder implant does not illustrate evidence of atrial fibrillation. Patient is noted to have aortic regurgitation on prior echocardiography.   Will repeat an echocardiogram in 1 year to evaluate disease progression.  Coronary atherosclerosis due to calcified coronary lesion of native artery Continue antiplatelet therapy and Crestor.  Hx of stroke involving the right caudate nucleus and right anterolateral thalamus (Jan 2022), Hx of  right parietooccipital stroke (May 2022) Last CVA May 2022. Reemphasized importance of improving her modifiable cardiovascular risk factors including glycemic control, blood pressure, lipid management  Hypertriglyceridemia Currently on pharmacological therapy. Had fasting lipids with PCP, per patient.  Will request records for reference Does not endorse evidence of myalgias or side effects  Type 2 diabetes mellitus with stage 3b chronic kidney disease, with long-term current use of insulin (HCC) Educated her on importance of glycemic control. I have discussed Farxiga/Jardiance in the past; however, she has had episodes of at least 3 UTIs and patient  appears to be  hesitant.  Mixed hyperlipidemia Currently on Crestor.   She denies myalgia or other side effects. Most recent lipids dated February 2023, independently reviewed as noted above.  Status post placement of implantable loop recorder Last remote transmission from March 2024 reviewed -no evidence of atrial fibrillation based on the last transmission.  FINAL MEDICATION LIST END OF ENCOUNTER:  No orders of the defined types were placed in this encounter.     Current Outpatient Medications:    acetaminophen (TYLENOL) 325 MG tablet, Take 2 tablets (650 mg total) by mouth every 4 (four) hours as needed for mild pain (or temp > 37.5 C (99.5 F))., Disp: , Rfl:    albuterol (VENTOLIN HFA) 108 (90 Base) MCG/ACT inhaler, Inhale 2 puffs into the lungs every 4 (four) hours as needed for wheezing., Disp: 1 g, Rfl: 5   amLODipine (NORVASC) 10 MG tablet, Take 1 tablet (10 mg total) by mouth daily., Disp: 30 tablet, Rfl: 2   aspirin EC 81 MG EC tablet, Take 1 tablet (81 mg total) by mouth daily. Swallow whole., Disp: 30 tablet, Rfl: 11   famotidine (PEPCID) 20 MG tablet, Take 1 tablet (20 mg total) by mouth daily., Disp: 30 tablet, Rfl: 0   fenofibrate (TRICOR) 145 MG tablet, TAKE 1 TABLET(145 MG) BY MOUTH DAILY (Patient taking differently: Take 145 mg by mouth daily.), Disp: 90 tablet, Rfl: 3   gabapentin (NEURONTIN) 100 MG capsule, Take 2 capsules (200 mg total) by mouth 2 (two) times daily as needed (pain). (Patient taking differently: Take 100 mg by mouth 2 (two) times daily.), Disp: 60 capsule, Rfl: 0   hydrALAZINE (APRESOLINE) 50 MG tablet, Take 50 mg by mouth in the morning and at bedtime., Disp: , Rfl:    insulin glargine (LANTUS SOLOSTAR) 100 UNIT/ML Solostar Pen, Please provide 24 units every a.m. and 20 units every p.m. (Patient taking differently: Inject 18-24 Units into the skin See admin instructions. 24 units in the morning 18 units at bedtime), Disp: 15 mL, Rfl: 11   Insulin Pen Needle (PEN  NEEDLES) 32G X 6 MM MISC, 1 application by Does not apply route 2 (two) times daily., Disp: 100 each, Rfl: 1   Multiple Vitamin (MULTIVITAMIN ADULT PO), Take 1 tablet by mouth daily., Disp: , Rfl:    pantoprazole (PROTONIX) 40 MG tablet, Take 40 mg by mouth daily., Disp: , Rfl:    Pirfenidone 801 MG TABS, TAKE 1 TABLET (801 MG) BY MOUTH WITH BREAKFAST, WITH LUNCH, AND WITH EVENING MEAL., Disp: 270 tablet, Rfl: 0   rosuvastatin (CRESTOR) 20 MG tablet, Take 1 tablet (20 mg total) by mouth daily., Disp: 90 tablet, Rfl: 1   sacubitril-valsartan (ENTRESTO) 49-51 MG, TAKE 1 TABLET BY MOUTH TWICE DAILY, Disp: 180 tablet, Rfl: 1   sertraline (ZOLOFT) 50 MG tablet, Take 50 mg by mouth daily., Disp: , Rfl:    SYMBICORT 160-4.5 MCG/ACT inhaler, INHALE 2 PUFFS INTO THE LUNGS TWICE DAILY, Disp: 10.2 g, Rfl: 5   Tiotropium Bromide Monohydrate (SPIRIVA RESPIMAT) 1.25 MCG/ACT AERS, Inhale 2 puffs into the lungs daily., Disp: 4 g, Rfl: 5   venlafaxine XR (EFFEXOR-XR) 150 MG 24 hr capsule, Take 150 mg by mouth daily., Disp: , Rfl:   Orders Placed This Encounter  Procedures   EKG 12-Lead   PCV ECHOCARDIOGRAM COMPLETE    --Continue cardiac medications as reconciled in final medication list. --Return in about 6 months (around 03/31/2023) for Follow up, Dyspnea, hx of loop implant. Or sooner  if needed. --Continue follow-up with your primary care physician regarding the management of your other chronic comorbid conditions.  Patient's questions and concerns were addressed to her satisfaction. She voices understanding of the instructions provided during this encounter.   This note was created using a voice recognition software as a result there may be grammatical errors inadvertently enclosed that do not reflect the nature of this encounter. Every attempt is made to correct such errors.  Tessa Lerner, [place], [facility]  Pager: [phone] Office: [phone]

## 2022-10-04 DIAGNOSIS — Z4509 Encounter for adjustment and management of other cardiac device: Secondary | ICD-10-CM | POA: Diagnosis not present

## 2022-10-04 DIAGNOSIS — Z95818 Presence of other cardiac implants and grafts: Secondary | ICD-10-CM | POA: Diagnosis not present

## 2022-10-04 DIAGNOSIS — I639 Cerebral infarction, unspecified: Secondary | ICD-10-CM | POA: Diagnosis not present

## 2022-10-07 ENCOUNTER — Encounter: Payer: Medicare HMO | Attending: Physical Medicine & Rehabilitation | Admitting: Physical Medicine & Rehabilitation

## 2022-10-07 ENCOUNTER — Telehealth: Payer: Self-pay | Admitting: Internal Medicine

## 2022-10-07 NOTE — Telephone Encounter (Signed)
On last Friday Patient agreed to come for Monday for her visit [address]. On Monday when it was time for her to show up and she didn't. I gave her a call back. She said she won't be able to make to the visit, she has some problem.So lauren and I cancelled her uber drive and her visit. On Wednesday when I cald her, she hung up on me . I cald her on Thursday for her visit on Friday and she said she has crisis at her place and she will call on Friday to reschedule but she didn't call. So when I cald her on Friday she said she will come on Monday and I started giving her instructions she listened halfway and hung up again. Another CRC called subject and was calmer and said that she is meeting with her family and would call her back by end of the day.    I will route to PI for further instructions.

## 2022-10-10 DIAGNOSIS — N3946 Mixed incontinence: Secondary | ICD-10-CM | POA: Diagnosis not present

## 2022-10-10 DIAGNOSIS — E119 Type 2 diabetes mellitus without complications: Secondary | ICD-10-CM | POA: Diagnosis not present
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
## 2022-10-13 DIAGNOSIS — E1140 Type 2 diabetes mellitus with diabetic neuropathy, unspecified: Secondary | ICD-10-CM | POA: Diagnosis not present

## 2022-10-14 ENCOUNTER — Telehealth: Payer: Self-pay | Admitting: Internal Medicine

## 2022-10-14 NOTE — Telephone Encounter (Signed)
After PI, Dr Marchelle Gearing called [facility] communications non-emergency line on 12 Oct 2022 at 5:35 PM to arrange a welfare check on [name], [facility] followed up with another call to [employer] on 13 Oct 2022 at 5:04 PM. They stated that they went to her house for welfare check and no one was home.  Plan: To try and reach Erica [name] next week on 17 Oct 2022 and ensure her wellbeing.

## 2022-10-18 NOTE — Telephone Encounter (Signed)
I called the patient cell phone yesterday 10/17/2022 and no response.  Today research coordinator Rosanne Sack also call the patient and several numbers listed in the emergency contact but again no response.  Plan - Research team to file a missing persons report on 10/19/2022

## 2022-10-19 ENCOUNTER — Telehealth: Payer: Self-pay | Admitting: Internal Medicine
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
## 2022-10-20 ENCOUNTER — Telehealth: Payer: Self-pay

## 2022-10-20 NOTE — Telephone Encounter (Signed)
CRC, Loma Newton, called Rudene Christians on 09/May/2024 at 3:10pm. I was able to get in contact with her after trying all week. She stated that her home life is "getting better" when I asked. She said it was "good and bad" but did not give details. I asked who was living at home with her, she said she lives alone. I asked where she was staying, she said she was "back home".   When asked if she was keeping up with the study drug she said that she was trying to keep up with it but that she is "not doing it like everyday."  When I tried to ask more clarifying questions she kept saying "I don't know, I don't know."  When asked how her health has been she stated that she has been "sick in my stomach." She stated that she has been throwing up since yesterday 08May2024.   I tried to ask that she schedule an appointment to see the study team as soon as possible. She said she doesn't know if she can come in and if she did come in it has to be "the week after next."  I tried to provide her with dates for a scheduled visit but she abruptly asked me to call her back because she needed the bathroom. Before hanging up I heard groaning noises that sounded like she was in some sort of pain.   Through out the phone call she asked several times if I could call her back another time but I kept asking her neccessary questions in hopes that she would stay on the line.    CRC, Loma Newton, called Rudene Christians back at 3:50pm. The phone did not ring at all. I suspect that she uses a land line phone and she has left it off the hook.

## 2022-10-21 NOTE — Telephone Encounter (Signed)
PI note  - Subject Erica [name] might no longer be an approprite as a study subject. LEt us convene at first avaialble opportunity next week of 10/24/22 to discuss .  Today being Friday and hospital work that is not possible    SIGNATURE    Dr. Kalman Shan, M.D., F.C.C.P, ACRP-CPI Pulmonary and Critical Care Medicine Research Investigator, [facility] @ [facility] Health Staff Physician, [facility] Center Director - Interstitial Lung Disease  Program  Pulmonary Fibrosis [facility] [place] [facility]monary and [facility] @ [facility] [place], [place], [address]   Pager: [phone], If no answer  OR between  19:00-7:00h: page [phone] Telephone (research): [phone]  7:25 AM 10/21/2022   7:25 AM 10/21/2022

## 2022-10-21 NOTE — Telephone Encounter (Signed)
PI Note  - noted. My advise was for [facility] team to keep trying to call subject    SIGNATURE    Dr. Kalman Shan, M.D., F.C.C.P, ACRP-CPI Pulmonary and Critical Care Medicine Research Investigator, [facility] @ [facility] Health Staff Physician, [facility] Center Director - Interstitial Lung Disease  Program  Pulmonary Fibrosis [facility] [place] [facility]monary and [facility] @ [facility] [place], [place], [address]   Pager: [phone], If no answer  OR between  19:00-7:00h: page [phone] Telephone (research): [phone]  7:22 AM 10/21/2022   7:22 AM 10/21/2022

## 2022-11-04 DIAGNOSIS — I639 Cerebral infarction, unspecified: Secondary | ICD-10-CM | POA: Diagnosis not present

## 2022-11-04 DIAGNOSIS — Z95818 Presence of other cardiac implants and grafts: Secondary | ICD-10-CM | POA: Diagnosis not present

## 2022-11-04 DIAGNOSIS — Z4509 Encounter for adjustment and management of other cardiac device: Secondary | ICD-10-CM | POA: Diagnosis not present

## 2022-11-10 DIAGNOSIS — N3946 Mixed incontinence: Secondary | ICD-10-CM | POA: Diagnosis not present

## 2022-11-10 DIAGNOSIS — E119 Type 2 diabetes mellitus without complications: Secondary | ICD-10-CM | POA: Diagnosis not present

## 2022-11-12 DIAGNOSIS — E1140 Type 2 diabetes mellitus with diabetic neuropathy, unspecified: Secondary | ICD-10-CM | POA: Diagnosis not present

## 2022-11-23 DIAGNOSIS — E1321 Other specified diabetes mellitus with diabetic nephropathy: Secondary | ICD-10-CM | POA: Diagnosis not present

## 2022-11-23 DIAGNOSIS — I10 Essential (primary) hypertension: Secondary | ICD-10-CM | POA: Diagnosis not present

## 2022-11-23 DIAGNOSIS — N390 Urinary tract infection, site not specified: Secondary | ICD-10-CM | POA: Diagnosis not present

## 2022-11-23 DIAGNOSIS — B370 Candidal stomatitis: Secondary | ICD-10-CM | POA: Diagnosis not present

## 2022-11-28 ENCOUNTER — Telehealth: Payer: Self-pay | Admitting: Internal Medicine

## 2022-11-28 NOTE — Telephone Encounter (Signed)
CRC, Erica [name],called Ms Erica [name] on 3 separate occasions dated 01 Nov 2022 at 9:42 AM, then 03 Nov 2022 at 9:25 am and then on 04 Nov 2022 at 13:29 pm. The attempts to reach Ms Erica [name] on multiple occasions were unsuccessful.Therefore,a certified letter was mailed to Ms Erica [name] at her home address on 16 November 2022 requesting their visit to return all study equipment and medication,effectively concluding her participation in the trial. We retain a proof of mailing with tracking id for verification of delivery.  She called in the office on 21 November 2022 after receiving the letter and pleaded to [facility] stay in the study. But PI ,Kalman Shan, said no and on PI's instructions her Early termination visit has been scheduled dated 29 November 2022 at 10 am.

## 2022-11-29 ENCOUNTER — Encounter: Payer: Medicare HMO | Attending: Physical Medicine & Rehabilitation | Admitting: Physical Medicine & Rehabilitation

## 2022-11-29 ENCOUNTER — Encounter: Payer: Medicare HMO | Admitting: *Deleted

## 2022-11-29 DIAGNOSIS — J84112 Idiopathic pulmonary fibrosis: Secondary | ICD-10-CM

## 2022-11-29 DIAGNOSIS — Z006 Encounter for examination for normal comparison and control in clinical research program: Secondary | ICD-10-CM

## 2022-11-29 NOTE — Research (Signed)
Title:A Randomized, Double-blind, Placebo-controlled, Phase 3 Study of the Efficacy and Safety of Inhaled Treprostinil in Subjects with Idiopathic Pulmonary Fibrosis   Dose and Duration of Treatment:Treprostinil for Inhalation 0.6 mg/mL, or placebo (randomly assigned 1:1) over a 52 week period.  Protocol # RIN-PF-301; IND # [id number]; Clinical Trials.gov Identifier: [id number]  Sponsor: [place], [place] [address]  Key Features of the Drug: Treprostinil (LRX-15, 15AU81, UT-15) is a stable tricyclic benzindene analogue of the naturally occurring prostacyclin (PGI2; epoprostenol), a member of the eicosanoid family of autocoids; these compounds occur widely in tissues and have important pharmacological properties with potent activity, especially on the cardiovascular system and smooth muscle (Ewing). An inhalation solution of treprostinil, 0.6 mg/mL, delivered using an ultrasonic nebulizer, is currently FDA approved to treat pulmonary arterial hypertension (PAH); [WHO Group 1, PH-ILD; WHO Group 3. Inhaled treprostinil is NOW being evaluated as primary treatment of IPF, idiopathic pulmonary fibrosis.   Key Inclusion Criteria: ?68 years of age Diagnosis of IPF based on the 2018 ATS/ERS/JRS/ALAT Clinical Practice Guideline (FVC ?45% predicted at Screening).  Subjects on pirfenidone or nintedanib must be on a stable and optimized dose for ?30 days prior to Baseline. Concomitant use of both pirfenidone and nintedanib is not permitted. Males with a partner of childbearing potential must use a condom for the duration of treatment and for at least 48 hours after discontinuing study drug.   Key Exclusion Criteria: Subject is pregnant or lactating.  FEV1/FVC <0.70 at Screening.  Prior intolerance or significant lack of efficacy to a prostacyclin or prostacyclin analogue that resulted in discontinuation or inability to effectively titrate that therapy.  The subject has  received any PAH-approved therapy, including prostacyclin therapy (epoprostenol, treprostinil, iloprost, or beraprost; except for acute vasoreactivity testing), IP receptor agonists (selexipag), endothelin receptor antagonists, phosphodiesterase type 5 inhibitors (PDE5-Is), or soluble guanylate cyclase stimulators within 60 days prior to Baseline. As needed use of a PDE5-I for erectile dysfunction is permitted, provided no doses are taken within 48 hours of any study-related efficacy assessments.  Use of any of the following medications:  `     a. Azathioprine (AZA), cyclosporine, mycophenolate mofetil, tacroliumus, oral           corticosteroids (OCS) >20 mg/day or the combination of            OCS+AZA+N-acetylcysteine within 30 days prior to Baseline.        b. Cyclophosphamide within 60 days prior to Baseline        c. Rituximab within 6 months prior to Baseline   Receiving >10 L/min of oxygen at rest at Baseline.   Exacerbation of IPF or active pulmonary or upper respiratory infection within 30 days prior to Baseline. Subjects must have completed any antibiotic or steroid regimens for treatment of the infection or acute exacerbation more than 30 days prior to Baseline to be eligible. If hospitalized for an acute exacerbation of IPF or a pulmonary or upper respiratory infection, subjects must have been discharged more than 90 days prior to Baseline to be eligible.  Uncontrolled cardiac disease, defined as myocardial infarction within 6 months prior to Baseline or unstable angina within 30 days prior to Baseline. Use of any investigational drug/device or participation in any investigational study in which the subject received a medical intervention (ie, procedure, device, medication/supplement) within 30 days prior to Screening. Subjects participating in non-interventional, observational, or registry studies are eligible.  Life expectancy <6 months due to IPF or a concomitant illness.  Acute  pulmonary  embolism within 90 days prior to Baseline.   Pharmacodynamics:  (Dose-Response Relationships) - In a clinical study of 241 healthy volunteers, single doses of Tyvaso 54 ?g (the target maintenance dose per session) and 84 ?g (supratherapeutic inhalation dose) prolonged the QTc interval by approximately 10 ms. The QTc effect dissipated rapidly as the concentration of treprostinil decreased.  Pharmacokinetics: (ADME) - Treprostinil is substantially metabolized by the liver, primarily by CYP2C8.  The elimination of treprostinil (following Miguel Barrera administration of treprostinil) is biphasic, with a terminal elimination t1?2 of approximately 4 hours.    Contraindications:   None  Special Warnings/Considerations: Treprostinil undergoes substantial hepatic metabolism; thus, liver impairment could result in decreased metabolism and increased systemic exposure to treprostinil potentiating the occurrence and/or severity of AEs and/or overdose. There are no adequate and well-controlled studies with Tyvaso in pregnant women. There are risks to the mother and the fetus associated with PAH. Patients who discontinue the use of any effective therapy may be expected to notice a decline in their clinical status. Patients who abruptly discontinue therapy (ie, miss 2 or more doses), or markedly reduce the dose, are at risk for worsening of PAH symptoms or a rebound in [facility]. There is no evidence to suggest or suspect that patients taking inhaled treprostinil would be at risk for withdrawal symptoms or rebound effects.  Treprostinil inhibits platelet aggregation and increases the risk of bleeding, particularly with concomitant anticoagulants and antiplatelet therapies. Treprostinil is a pulmonary and systemic vasodilator. In patients with low systemic arterial pressure, inhaled treprostinil solution may cause symptomatic hypotension, which may present as but is not limited to light-headedness, dizziness, blurred or faded vision,  and syncope. Concomitant administration of treprostinil with diuretics, antihypertensive agents, or other vasodilators that may lower blood pressure increases the risk of symptomatic hypotension. Signs and symptoms of overdose with inhaled treprostinil solution may correspond to dose-limiting pharmacologic effects, including diarrhea, flushing, headache, hypotension, nausea, and vomiting. Most events are self-limiting and resolve with reduction or withholding of inhaled treprostinil solution.  Interactions:              Drug Interaction Studies Conducted with Treprostinil: Study # P01:08: Treprostinil formulation [place] Remodulin vs Acetaminophen, to evaluate effect of acetaminophen on Treprostinil pK, showed no effect. Study # P01:12: Treprostinil formulation [place] Remodulin vs Warfarin, to evaluate effect of Warfarin on Treprostinil pK/PD, showed no effect. Study TDE-PH-105: Treprostinil formulation Orenitram vs Bosentan, to evaluate effect of Bosentan on Treprostinil pK, showed no effect. Study TDE-PH-106: Treprostinil formulation Orenitram vs Sildenafil, to evaluate effect of Sildenafil on Treprostinil pK, showed no effect. Study TDE-PH-109: Treprostinil formulation Orenitram vs Rifampicin (CYP2C8/9 inducer), to evaluate the effect of inducing the DEY8X4 metabolic pathway on the pK of Treprostinil, confirmed metabolic pathway via GYJ8H6/3. Expected interaction resulting in reduced Cmax and AUC. Study TDE-PH-110: Treprostinil formulation Orenitram vs Gemfibrozil (CYP2C8  inhibitor), to evaluate the effect of inhibiting the JSH7W2 metabolic pathway on the pK of Treprostinil, confirmed metabolic pathway primarily via CYP2C8. Expected interaction resulting in significantly increased Cmax and AUC. Study TDE-PH-110: Treprostinil formulation Orenitram vs Fluconazole (CYP2C9 inhibitor), to evaluate the effect of inhibiting the OVZ8H8 metabolic pathway on on the pK of Treprostinil, had no notable effect. Confirmed  CYP2C9 plays a minor role in treprostinil metabolism.  Serious Adverse Reactions Observed in Clinical Studies with Inhaled Treprostinil: Pulmonary Arterial Hypertension: all occurrences from RIN-PH-301 study for Pulmonary Arterial Hypertension. No serious adverse reactions met criteria for inclusion in inhaled treprostinil solution clinical studies for interstitial lung disease.  System/Organ Class: Nervous System disorders - Syncope in 3 of 115 subjects exposed (2.6%)      Serious Adverse Reactions Observed Postmarketing: Gastrointestinal disorders: vomiting, nausea, diarrhea, GI Hemorrhage (reflects an aggregate of the following: GI hemorrhage, rectal hemorrhage, blood in stool [hematochezia or melena], and hematemesis). General disorders and administration site conditions: Pain Infections and infestations: Pneumonia Nervous system disorders: Syncope, dizziness, headache Respiratory, thoracic and mediastinal disorders: Hemoptysis, cough, epistaxis Vascular disorders: Hypotension, hemorrhage (unspecified blood loss ranging from nondescript to heavy or excessive bleeding).  Safety Data:  As of 28 December 2020, approximately 955 subjects have received treprostinil by inhalation across various studies, ranging from acute studies in healthy volunteers to long-term treatment in subjects with PAH and PH-ILD.   The estimate of worldwide exposure to treprostinil in the marketed inhaled formulations is approximately 16,109,604 total patient treatment-days (approximately 34,318 patient treatment-years). The most common adverse effects of inhaled treprostinil ?10% were cough, diarrhea, dizziness, flushing, headache, muscle/jaw/bone pain, nausea, pharyngolaryngeal pain, oropharyngeal pain, syncope, and throat irritation. Other adverse reactions in the observational study comparing subjects taking inhaled treprostinil and a control group were cough, hemoptysis, nasal discomfort, and throat irritation. Angioedema is  a rare but serious reaction that has been seen in the post-marketing setting with treprostinil.  Stability:  Ampoules of drug product are stable until the date indicated when stored in the unopened foil pouch. The [place] commercial labeling states the following regarding storage conditions: Store at 20C to 25C (41F to 35F), with excursions permitted to 15C to 30C (48F to 26F).  Once the foil pack is opened, ampoules should be used within 7 days. Because Tyvaso is light-sensitive, unopened ampoules should be stored in the foil pouch.     [facility] @ [place] Clinical Research Coordinator note:   This visit for Subject Erica [name] with DOB: 08-18-1955 on 11/29/2022 for the above protocol is Visit/Encounter # Early termination  and is for purpose of research.   Protocol Version: Amendment 4 dated: 26Jul2023 IB: Version 18- 15Jun23 ICF: Ver. 3.0, 13 Jan 2022, WCG Ver. 3.1, 10 Mar 2022, IRB APPROVED AS MODIFIED Mar 15, 2022 Lab Manual: V4 dated 07Jun2023  Subject was non compliant and didn't show up for visits. After unsuccessfull attempts on several occasions, PI Murali Ramaswamy decided to remove her form the study.  Subject was asked to return all study equipment and medication related to the study but they just returned just one box of study drug out of three. Patient didn't return the diary. They just returned one nebulizer machine out of the two. They say they have lost it or thrown the stuff away,not sure. In this visit 11/29/2022 the subject will be evaluated by Marga Melnick, MD Sub-Investigator. This research coordinator has verified that the above investigator is  YES up to date with his/her training logs.All assessments were done as per the protocol.    1. This visit is a key visit of Early termination visit. The PI is NOT available for this visit.  Because the PI is NOT available, the Sub-Investigator Erica Noa Hopper,MD reported and CRC has confirmed that the PI Erica [name]  discussed the visit a-priori with the Sub-Investigator.  2.  In addition, ahead of the key visit of ET the visit and subject were discussed with the PI Erica [name] on date of 28 November 2022 over phone as part of direct PI oversight.   Further details in the binder.     Signed by  Neita Garnet  Clinical Research Coordinator  [facility]  [place], [place] 1:48 PM 11/29/2022

## 2022-11-29 NOTE — Telephone Encounter (Signed)
Title:A Randomized, Double-blind, Placebo-controlled, Phase 3 Study of the Efficacy and Safety of Inhaled Treprostinil in Subjects with Idiopathic Pulmonary Fibrosis   Dose and Duration of Treatment:Treprostinil for Inhalation 0.6 mg/mL, or placebo (randomly assigned 1:1) over a 52 week period.  Protocol # RIN-PF-301; IND # [id number]; Clinical Trials.gov Identifier: [id number]  Sponsor: [employer]Research [place], [place]  Xxxxxxxxxxxxxxxxxxxxxxxxxxx   PI OVERSIGHT ATTESTATION  I the Principal Investigator (PI) for the above mentioned study attest that I reviewed the mentioned  clinical research coordinator  notes on research subject  Erica [name]  born 02/22/1956 . I  agree with the findings mentioned above. PATIENT BEING WITHDRAWN from study due to lack of cooperation   Dr.Psalm Arman Marchelle Gearing, MD Pulmonary and Critical Care Medicine Research Investigator & Staff Physician [facility] Pulmonary and [facility] Medical [facility]  [place] Pulmonary and Critical Care Pager: [phone], If no answer or between  15:00h - 7:00h: call [phone]  11/29/2022 4:52 PM

## 2022-11-30 NOTE — Progress Notes (Addendum)
Erica [name] , DOB Jun 29, 1955 , was seen as subject in a clinical trial /Protocol # RIN-PF-301 specifically for Early Termination from the study due to documented consistent non compliance with study protocol. Cardiopulmonary symptoms are stable Other or new symptoms denied. Pertinent physical findings include: L lobe of thyroid not palpable ;R lobe normal; split S1; accentuated S2; tendency toward clubbing of nailbeds;fine rales @  RLL > LLL. All physical findings NCS                                                                     Pecola Lawless MD,SI

## 2022-12-05 ENCOUNTER — Other Ambulatory Visit: Payer: Self-pay | Admitting: Cardiology

## 2022-12-05 DIAGNOSIS — Z4509 Encounter for adjustment and management of other cardiac device: Secondary | ICD-10-CM | POA: Diagnosis not present

## 2022-12-05 DIAGNOSIS — R0602 Shortness of breath: Secondary | ICD-10-CM

## 2022-12-05 DIAGNOSIS — I639 Cerebral infarction, unspecified: Secondary | ICD-10-CM | POA: Diagnosis not present

## 2022-12-05 DIAGNOSIS — Z95818 Presence of other cardiac implants and grafts: Secondary | ICD-10-CM | POA: Diagnosis not present

## 2022-12-05 DIAGNOSIS — Z794 Long term (current) use of insulin: Secondary | ICD-10-CM

## 2022-12-05 DIAGNOSIS — N183 Chronic kidney disease, stage 3 unspecified: Secondary | ICD-10-CM

## 2022-12-20 ENCOUNTER — Ambulatory Visit
Admission: RE | Admit: 2022-12-20 | Discharge: 2022-12-20 | Disposition: A | Discharge status: Home / Self Care | Payer: Medicare HMO | Source: Ambulatory Visit | Attending: Internal Medicine | Admitting: Internal Medicine

## 2022-12-20 DIAGNOSIS — Z1231 Encounter for screening mammogram for malignant neoplasm of breast: Secondary | ICD-10-CM

## 2022-12-23 ENCOUNTER — Other Ambulatory Visit: Payer: Self-pay | Admitting: Internal Medicine

## 2022-12-24 LAB — URINE CULTURE
MICRO NUMBER:: 15193659
Result:: NO GROWTH
SPECIMEN QUALITY:: ADEQUATE

## 2023-01-06 ENCOUNTER — Emergency Department (HOSPITAL_COMMUNITY): Payer: Medicare HMO

## 2023-01-06 ENCOUNTER — Emergency Department (HOSPITAL_COMMUNITY)
Admission: EM | Admit: 2023-01-06 | Discharge: 2023-01-06 | Disposition: A | Discharge status: Home / Self Care | Payer: Medicare HMO | Source: Home / Self Care | Attending: Emergency Medicine | Admitting: Emergency Medicine

## 2023-01-06 ENCOUNTER — Encounter (HOSPITAL_COMMUNITY): Payer: Self-pay

## 2023-01-06 DIAGNOSIS — J45909 Unspecified asthma, uncomplicated: Secondary | ICD-10-CM | POA: Insufficient documentation

## 2023-01-06 DIAGNOSIS — M7989 Other specified soft tissue disorders: Secondary | ICD-10-CM | POA: Insufficient documentation

## 2023-01-06 DIAGNOSIS — I10 Essential (primary) hypertension: Secondary | ICD-10-CM | POA: Diagnosis not present

## 2023-01-06 DIAGNOSIS — R079 Chest pain, unspecified: Secondary | ICD-10-CM | POA: Diagnosis not present

## 2023-01-06 DIAGNOSIS — R600 Localized edema: Secondary | ICD-10-CM | POA: Diagnosis not present

## 2023-01-06 DIAGNOSIS — R110 Nausea: Secondary | ICD-10-CM | POA: Insufficient documentation

## 2023-01-06 DIAGNOSIS — E119 Type 2 diabetes mellitus without complications: Secondary | ICD-10-CM | POA: Insufficient documentation

## 2023-01-06 LAB — CBC
HCT: 31.7 % — ABNORMAL LOW (ref 36.0–46.0)
Hemoglobin: 11.2 g/dL — ABNORMAL LOW (ref 12.0–15.0)
MCH: 32.7 pg (ref 26.0–34.0)
MCHC: 35.3 g/dL (ref 30.0–36.0)
MCV: 92.7 fL (ref 80.0–100.0)
Platelets: 145 10*3/uL — ABNORMAL LOW (ref 150–400)
RBC: 3.42 MIL/uL — ABNORMAL LOW (ref 3.87–5.11)
RDW: 16.9 % — ABNORMAL HIGH (ref 11.5–15.5)
WBC: 3.9 10*3/uL — ABNORMAL LOW (ref 4.0–10.5)
nRBC: 0 % (ref 0.0–0.2)

## 2023-01-06 LAB — BASIC METABOLIC PANEL
Anion gap: 7 (ref 5–15)
BUN: 16 mg/dL (ref 8–23)
CO2: 21 mmol/L — ABNORMAL LOW (ref 22–32)
Calcium: 8.6 mg/dL — ABNORMAL LOW (ref 8.9–10.3)
Chloride: 112 mmol/L — ABNORMAL HIGH (ref 98–111)
Creatinine, Ser: 1.55 mg/dL — ABNORMAL HIGH (ref 0.44–1.00)
GFR, Estimated: 36 mL/min — ABNORMAL LOW (ref >60)
Glucose, Bld: 137 mg/dL — ABNORMAL HIGH (ref 70–99)
Potassium: 3.9 mmol/L (ref 3.5–5.1)
Sodium: 140 mmol/L (ref 135–145)

## 2023-01-06 LAB — BRAIN NATRIURETIC PEPTIDE: B Natriuretic Peptide: 44.6 pg/mL (ref 0.0–100.0)

## 2023-01-06 LAB — TROPONIN I (HIGH SENSITIVITY): Troponin I (High Sensitivity): 14 ng/L (ref <18)

## 2023-01-06 MED ORDER — FUROSEMIDE 20 MG PO TABS: 20.0000 mg | ORAL_TABLET | Freq: Every day | ORAL | 0 refills | Status: DC

## 2023-01-06 MED ORDER — ACETAMINOPHEN 500 MG PO TABS
1000.0000 mg | ORAL_TABLET | Freq: Once | ORAL | Status: AC
  Administered 2023-01-06: 1000 mg via ORAL
  Filled 2023-01-06: qty 2

## 2023-01-06 NOTE — ED Provider Notes (Signed)
[place] EMERGENCY DEPARTMENT AT [facility] Provider Note  MDM   HPI/ROS:  Erica [name] is a 67 y.o. female with a medical history as below who presents with complaint of right-sided chest pain and bilateral lower extremity swelling.  Reports that she stood up from her chair and started having right-sided chest pain.  She does report that her legs have been swelling and this is unusual for her.  She also reports that she has had some nausea but no vomiting.  Denies any recent fevers, chills.  She does report that she has missed some of her medication recently, but believes it was only 1 or 2 days.   Exam without evidence of volume overload so doubt heart failure. EKG without signs of active ischemia. Given the timing of pain to ER presentation, single troponin was 14 so doubt NSTEMI. Presentation not consistent with acute PE, pneumothorax (not visualized on chest xr), thoracic aortic dissection, pericarditis, tamponade, pneumonia (no infectious symptoms, clear chest xr), myocarditis (no recent illness, neg trop). HEART score: 3, so plan to discharge patient home with PMD follow up.   Interpretations, interventions, and the patient's course of care are documented below.     Disposition: Discharge    I discussed the plan for discharge with the patient and/or their surrogate at bedside prior to discharge and they were in agreement with the plan and verbalized understanding of the return precautions provided. All questions answered to the best of my ability. Ultimately, the patient was discharged in stable condition with stable vital signs. I am reassured that they are capable of close follow up and good social support at home.   Clinical Impression:  1. Leg swelling     Rx / DC Orders ED Discharge Orders          Ordered    furosemide (LASIX) 20 MG tablet  Daily        01/06/23 2042            The plan for this patient was discussed with Dr. Silverio Lay, who voiced agreement and  who oversaw evaluation and treatment of this patient.   Clinical Complexity A medically appropriate history, review of systems, and physical exam was performed.  My independent interpretations of EKG, labs, and radiology are documented in the ED course above.   If decision rules were used in this patient's evaluation, they are listed below.  HEART SCORE: 3   Click here for ABCD2, HEART and other calculatorsREFRESH Note before signing   Patient's presentation is most consistent with acute complicated illness / injury requiring diagnostic workup.  Medical Decision Making Amount and/or Complexity of Data Reviewed Labs: ordered. Radiology: ordered.  Risk OTC drugs. Prescription drug management.    HPI/ROS      See MDM section for pertinent HPI and ROS. A complete ROS was performed with pertinent positives/negatives noted above.   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   GERD (gastroesophageal reflux disease)    Headache(784.0)    "mild"   Hypertension    Loop Biotronik loop implant 12/16/2020 12/16/2020   Mental disorder    depression   Neuropathy    feet    Pain    arthritis pain - takes tramadol as needed   Rectal polyp    very little bleeding with bowel movements- no pain  Stroke (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   Suicide attempt [facility])     Past Surgical History:  Procedure Laterality Date   ANAL RECTAL MANOMETRY N/A 07/13/2016   Procedure: ANO RECTAL MANOMETRY;  Surgeon: Romie Levee, MD;  Location: [facility] ENDOSCOPY;  Service: Endoscopy;  Laterality: N/A;   CESAREAN SECTION     EUS N/A 11/21/2012   Procedure: LOWER ENDOSCOPIC ULTRASOUND (EUS);  Surgeon: Willis Modena, MD;  Location: [facility];  Service: Endoscopy;  Laterality: N/A;   FLEXIBLE SIGMOIDOSCOPY N/A 11/21/2012   Procedure: FLEXIBLE SIGMOIDOSCOPY;  Surgeon: Willis Modena, MD;  Location: [facility];   Service: Endoscopy;  Laterality: N/A;   FLEXIBLE SIGMOIDOSCOPY N/A 01/28/2013   Procedure: FLEXIBLE SIGMOIDOSCOPY;  Surgeon: Romie Levee, MD;  Location: [facility];  Service: Endoscopy;  Laterality: N/A;   LAPAROSCOPIC LOW ANTERIOR RESECTION N/A 01/29/2013   Procedure: LAPAROSCOPIC LOW ANTERIOR RESECTION, Rigid Proctoscopy;  Surgeon: Romie Levee, MD;  Location: [facility]S;  Service: General;  Laterality: N/A;   LAPAROSCOPIC SIGMOID COLECTOMY N/A 11/14/2012   Procedure: DIAGNOSTIC LAPAROSCOPY AND SIGMOIDMOIDOSCOPY ;  Surgeon: Emelia Loron, MD;  Location: [facility] ORS;  Service: General;  Laterality: N/A;   RECTAL ULTRASOUND N/A 07/13/2016   Procedure: RECTAL ULTRASOUND;  Surgeon: Romie Levee, MD;  Location: [facility];  Service: Endoscopy;  Laterality: N/A;   TONSILLECTOMY     TONSILLECTOMY AND ADENOIDECTOMY        Physical Exam   Vitals:   01/06/23 1617 01/06/23 1819 01/06/23 2000  BP: (!) 150/91 (!) 167/87 (!) 162/84  Pulse: 78 74 73  Resp: 18 18 19   Temp: 98.4 F (36.9 C) 98 F (36.7 C)   TempSrc: Oral Oral   SpO2: 100% 100% 100%    Physical Exam Vitals and nursing note reviewed.  Constitutional:      General: She is not in acute distress.    Appearance: She is well-developed.  HENT:     Head: Normocephalic and atraumatic.  Eyes:     Conjunctiva/sclera: Conjunctivae normal.  Cardiovascular:     Rate and Rhythm: Normal rate and regular rhythm.     Heart sounds: No murmur heard. Pulmonary:     Effort: Pulmonary effort is normal. No respiratory distress.     Breath sounds: Normal breath sounds.  Abdominal:     Palpations: Abdomen is soft.     Tenderness: There is no abdominal tenderness.  Musculoskeletal:        General: No swelling.     Cervical back: Neck supple.     Right lower leg: 1+ Edema present.     Left lower leg: 1+ Edema present.  Skin:    General: Skin is warm and dry.     Capillary Refill: Capillary refill takes less than 2 seconds.  Neurological:      Mental Status: She is alert.  Psychiatric:        Mood and Affect: Mood normal.      Procedures   If procedures were preformed on this patient, they are listed below:  Procedures   Fayrene Helper, MD Emergency Medicine PGY-2   Please note that this documentation was produced with the assistance of voice-to-text technology and may contain errors.    Fayrene Helper, MD 01/07/23 [id number]    Charlynne Pander, MD 01/09/23 [phone]

## 2023-01-06 NOTE — ED Triage Notes (Signed)
Pt is coming in for bilateral leg swelling this has been ongoing for 1 week with pain that has started 3 days ago. Pt is afraid of the swelling and how it will affect her heart. Pt has not been taking her prescribed meds because she is moving and she does not know the names of her medications.  Medic vitals   142/86 84hr 20rr 98% 147bgl

## 2023-01-06 NOTE — Discharge Instructions (Addendum)
You were seen today for leg swelling and right sided chest pain. While you were here we monitored your vitals, preformed a physical exam, and got labs and a chest xray. These were all reassuring and there is no indication for any further testing or intervention in the emergency department at this time.   Take lasix 20 mg daily x 4 days   Things to do:  - Follow up with your primary care provider within the next 1-2 weeks - As we discussed, you can take Acetaminophen (also called Tylenol) for your pain. You can take up 1 gram (or 1,000 mg) every 6 hours. Do not take more than 4 grams over a 24 hour period from all medications you take. Tylenol is a common ingredient in other medications (such as Norco, Percocet/Vicodin, Tylenol #3, etc), so please read the labels carefully. If you have a history of liver disease you should not take Acetaminophen as it can worsen your liver function.   You may also take Ibuprofen (also called Advil or Motrin) or Naproxen (also called Aleve) for your pain. If you are taking Ibuprofen, you can take up to 600 mg every 6 hours. If you are taking Naproxen, you can take up to 500 mg every 12 hours. Do not take more than this amount as it can cause kidney problems or bleeding in your stomach. Do not take ibuprofen and naproxen at the same time as it can increase your risk for stomach bleeds and kidney failure. If you have a history of heart problems or have had uncontrolled blood pressure for a significant period of time you should not take these medications as it can increase your risk for heart attack or stroke.  You may alternate tylenol and ibuprofen every 3-4 hours, if needed. For instance if you take tylenol at noon, you can take ibuprofen at 3 pm, and tylenol again at 6 pm.   Return to the emergency department if you have any new or worsening symptoms including worsening chest pain, nausea, vomiting, or if you have any other concerns.

## 2023-01-11 ENCOUNTER — Telehealth: Payer: Self-pay

## 2023-01-11 NOTE — Telephone Encounter (Signed)
Transition Care Management Unsuccessful Follow-up Telephone Call  Date of discharge and from where:  01/06/2023 [facility]  Attempts:  1st Attempt  Reason for unsuccessful TCM follow-up call:  No answer/busy   [facility]  [facility] Population Health Community Resource Care Guide   [email or web address]  ?? [phone]   Website: [email or web address]

## 2023-01-11 NOTE — Telephone Encounter (Signed)
Transition Care Management Unsuccessful Follow-up Telephone Call  Date of discharge and from where:  01/06/2023 [facility]  Attempts:  2nd Attempt  Reason for unsuccessful TCM follow-up call:  No answer/busy  Jasmon Graffam [facility]  [facility] Population Health Community Resource Care Guide   [email or web address]  ?? [phone]   Website: [email or web address]

## 2023-01-19 NOTE — Patient Instructions (Addendum)
ICD-10-CM   1. IPF (idiopathic pulmonary fibrosis) (HCC)  J84.112 Hepatic function panel    Pulmonary function test    Hepatic function panel    2. Medication monitoring encounter  Z51.81     3. Pulmonary emphysema, unspecified emphysema type (HCC)  J43.9     4. Chronic kidney disease, unspecified CKD stage  N18.9     5. Anemia, unspecified type  D64.9         Clinically pulmonary fibrosis is stable.    There is some amount of diarrhea because of pirfenidone -but overall tolerating pirfenidone well  Anemia and kidney are stable  Glad domestic situation is better  Plan -Check liver function test today 01/20/2023 --Continue pirfenidone currently at full dose protocol --Continue inhaler therapy Spiriva and Symbicort scheduled for your emphysema  - do spirometry and dlco in 3 months - no clinical trial for now given social situation   Follow-up -Return in 3 months to see Dr. Marchelle Gearing but after PFT   Symptom sscore and walk test at followup; 30 min visit
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
## 2023-01-20 ENCOUNTER — Ambulatory Visit (INDEPENDENT_AMBULATORY_CARE_PROVIDER_SITE_OTHER): Payer: Medicare HMO | Admitting: Internal Medicine

## 2023-01-20 ENCOUNTER — Encounter: Payer: Self-pay | Admitting: Internal Medicine

## 2023-01-20 VITALS — BP 130/100 | HR 71 | Ht 63.0 in | Wt 166.8 lb

## 2023-01-20 DIAGNOSIS — Z5181 Encounter for therapeutic drug level monitoring: Secondary | ICD-10-CM

## 2023-01-20 DIAGNOSIS — Z87898 Personal history of other specified conditions: Secondary | ICD-10-CM

## 2023-01-20 DIAGNOSIS — N189 Chronic kidney disease, unspecified: Secondary | ICD-10-CM

## 2023-01-20 DIAGNOSIS — J439 Emphysema, unspecified: Secondary | ICD-10-CM

## 2023-01-20 DIAGNOSIS — Z139 Encounter for screening, unspecified: Secondary | ICD-10-CM

## 2023-01-20 DIAGNOSIS — J84112 Idiopathic pulmonary fibrosis: Secondary | ICD-10-CM

## 2023-01-20 DIAGNOSIS — D649 Anemia, unspecified: Secondary | ICD-10-CM

## 2023-01-20 LAB — HEPATIC FUNCTION PANEL
ALT: 8 U/L (ref 0–35)
AST: 15 U/L (ref 0–37)
Albumin: 3.7 g/dL (ref 3.5–5.2)
Alkaline Phosphatase: 123 U/L — ABNORMAL HIGH (ref 39–117)
Bilirubin, Direct: 0.2 mg/dL (ref 0.0–0.3)
Total Bilirubin: 0.8 mg/dL (ref 0.2–1.2)
Total Protein: 6.9 g/dL (ref 6.0–8.3)

## 2023-02-01 NOTE — Progress Notes (Signed)
Pt has PCP but may want a new one. [facility] list given. Pt has many SDOH needs. Resources given and explained. Please follow up on any needs, pt is currently homeless.

## 2023-02-07 ENCOUNTER — Encounter: Payer: Self-pay | Admitting: *Deleted

## 2023-02-07 NOTE — Progress Notes (Signed)
Pt attended 02/01/23 screening event where her b/p was 138/82. At the event and during the f/u call by the health equity team member, pt stated she wanted to change her PCP from Dr. Charlotte Sanes (who she last saw on 11/23/22, to someone closer to her current home in [place], [place], so pt connected to [facility] today to make an appt at that location to get established with a PCP. Pt also noted at the event that she had food, housing, transportation, utility, and safety concerns and was given resources for all these needs at the event. Chart review indicates pt also has [facility] support for SW/care management needs (encounter 01/19/23) and for counseling ( see 8/2- 01/19/23 [facility] notes.) In addition,pt has cardiology support by Dr. Yates Decamp and pulmonary support from Dr. Marchelle Gearing for chronic pulmonary conditions and has future appt with him 04/21/23. Pt shared her current mailing address where she is staying in [place] and a letter was mailed to her with the [facility] information. Pt also mentioned being interested in coming to a primary care clinic near [facility], so the [employer] [facility] in [place] (Internal Medicine Center) and next to [facility] and [facility]) locations and contact information were also included with the letter.

## 2023-03-04 ENCOUNTER — Emergency Department: Payer: Medicare HMO

## 2023-03-04 ENCOUNTER — Inpatient Hospital Stay
Admission: EM | Admit: 2023-03-04 | Discharge: 2023-03-08 | DRG: 065 | Disposition: A | Discharge status: Rehabilitation Facility | Payer: Medicare HMO | Attending: Internal Medicine | Admitting: Internal Medicine

## 2023-03-04 ENCOUNTER — Observation Stay: Payer: Medicare HMO

## 2023-03-04 ENCOUNTER — Other Ambulatory Visit: Payer: Self-pay

## 2023-03-04 DIAGNOSIS — R54 Age-related physical debility: Secondary | ICD-10-CM | POA: Diagnosis present

## 2023-03-04 DIAGNOSIS — K219 Gastro-esophageal reflux disease without esophagitis: Secondary | ICD-10-CM | POA: Diagnosis present

## 2023-03-04 DIAGNOSIS — R4182 Altered mental status, unspecified: Principal | ICD-10-CM

## 2023-03-04 DIAGNOSIS — Z1152 Encounter for screening for COVID-19: Secondary | ICD-10-CM

## 2023-03-04 DIAGNOSIS — Z7985 Long-term (current) use of injectable non-insulin antidiabetic drugs: Secondary | ICD-10-CM

## 2023-03-04 DIAGNOSIS — J449 Chronic obstructive pulmonary disease, unspecified: Secondary | ICD-10-CM | POA: Diagnosis present

## 2023-03-04 DIAGNOSIS — Z801 Family history of malignant neoplasm of trachea, bronchus and lung: Secondary | ICD-10-CM

## 2023-03-04 DIAGNOSIS — R2990 Unspecified symptoms and signs involving the nervous system: Secondary | ICD-10-CM | POA: Diagnosis not present

## 2023-03-04 DIAGNOSIS — Z880 Allergy status to penicillin: Secondary | ICD-10-CM

## 2023-03-04 DIAGNOSIS — Z823 Family history of stroke: Secondary | ICD-10-CM

## 2023-03-04 DIAGNOSIS — R29810 Facial weakness: Secondary | ICD-10-CM | POA: Diagnosis present

## 2023-03-04 DIAGNOSIS — Z7982 Long term (current) use of aspirin: Secondary | ICD-10-CM

## 2023-03-04 DIAGNOSIS — I351 Nonrheumatic aortic (valve) insufficiency: Secondary | ICD-10-CM

## 2023-03-04 DIAGNOSIS — F332 Major depressive disorder, recurrent severe without psychotic features: Secondary | ICD-10-CM | POA: Diagnosis present

## 2023-03-04 DIAGNOSIS — Z79899 Other long term (current) drug therapy: Secondary | ICD-10-CM

## 2023-03-04 DIAGNOSIS — E1122 Type 2 diabetes mellitus with diabetic chronic kidney disease: Secondary | ICD-10-CM | POA: Diagnosis present

## 2023-03-04 DIAGNOSIS — R42 Dizziness and giddiness: Secondary | ICD-10-CM

## 2023-03-04 DIAGNOSIS — Z833 Family history of diabetes mellitus: Secondary | ICD-10-CM

## 2023-03-04 DIAGNOSIS — Z7951 Long term (current) use of inhaled steroids: Secondary | ICD-10-CM

## 2023-03-04 DIAGNOSIS — I6381 Other cerebral infarction due to occlusion or stenosis of small artery: Principal | ICD-10-CM | POA: Diagnosis present

## 2023-03-04 DIAGNOSIS — R29701 NIHSS score 1: Secondary | ICD-10-CM | POA: Diagnosis present

## 2023-03-04 DIAGNOSIS — Z87891 Personal history of nicotine dependence: Secondary | ICD-10-CM

## 2023-03-04 DIAGNOSIS — J849 Interstitial pulmonary disease, unspecified: Secondary | ICD-10-CM | POA: Diagnosis present

## 2023-03-04 DIAGNOSIS — D631 Anemia in chronic kidney disease: Secondary | ICD-10-CM | POA: Diagnosis present

## 2023-03-04 DIAGNOSIS — J4489 Other specified chronic obstructive pulmonary disease: Secondary | ICD-10-CM | POA: Diagnosis present

## 2023-03-04 DIAGNOSIS — E785 Hyperlipidemia, unspecified: Secondary | ICD-10-CM | POA: Diagnosis present

## 2023-03-04 DIAGNOSIS — R0602 Shortness of breath: Secondary | ICD-10-CM

## 2023-03-04 DIAGNOSIS — Z794 Long term (current) use of insulin: Secondary | ICD-10-CM

## 2023-03-04 DIAGNOSIS — M19012 Primary osteoarthritis, left shoulder: Secondary | ICD-10-CM | POA: Diagnosis present

## 2023-03-04 DIAGNOSIS — E1165 Type 2 diabetes mellitus with hyperglycemia: Secondary | ICD-10-CM

## 2023-03-04 DIAGNOSIS — E1140 Type 2 diabetes mellitus with diabetic neuropathy, unspecified: Secondary | ICD-10-CM | POA: Diagnosis present

## 2023-03-04 DIAGNOSIS — N1832 Chronic kidney disease, stage 3b: Secondary | ICD-10-CM | POA: Diagnosis present

## 2023-03-04 DIAGNOSIS — J84112 Idiopathic pulmonary fibrosis: Secondary | ICD-10-CM | POA: Diagnosis present

## 2023-03-04 DIAGNOSIS — Z83438 Family history of other disorder of lipoprotein metabolism and other lipidemia: Secondary | ICD-10-CM

## 2023-03-04 DIAGNOSIS — I10 Essential (primary) hypertension: Secondary | ICD-10-CM | POA: Diagnosis present

## 2023-03-04 DIAGNOSIS — I639 Cerebral infarction, unspecified: Secondary | ICD-10-CM | POA: Diagnosis present

## 2023-03-04 DIAGNOSIS — I69354 Hemiplegia and hemiparesis following cerebral infarction affecting left non-dominant side: Secondary | ICD-10-CM

## 2023-03-04 DIAGNOSIS — Z66 Do not resuscitate: Secondary | ICD-10-CM | POA: Diagnosis present

## 2023-03-04 DIAGNOSIS — Z23 Encounter for immunization: Secondary | ICD-10-CM

## 2023-03-04 DIAGNOSIS — I129 Hypertensive chronic kidney disease with stage 1 through stage 4 chronic kidney disease, or unspecified chronic kidney disease: Secondary | ICD-10-CM | POA: Diagnosis present

## 2023-03-04 DIAGNOSIS — Z800 Family history of malignant neoplasm of digestive organs: Secondary | ICD-10-CM

## 2023-03-04 DIAGNOSIS — F0150 Vascular dementia without behavioral disturbance: Secondary | ICD-10-CM | POA: Diagnosis present

## 2023-03-04 DIAGNOSIS — I159 Secondary hypertension, unspecified: Secondary | ICD-10-CM

## 2023-03-04 DIAGNOSIS — Z8249 Family history of ischemic heart disease and other diseases of the circulatory system: Secondary | ICD-10-CM

## 2023-03-04 DIAGNOSIS — Z7902 Long term (current) use of antithrombotics/antiplatelets: Secondary | ICD-10-CM

## 2023-03-04 DIAGNOSIS — Z85048 Personal history of other malignant neoplasm of rectum, rectosigmoid junction, and anus: Secondary | ICD-10-CM

## 2023-03-04 DIAGNOSIS — M19011 Primary osteoarthritis, right shoulder: Secondary | ICD-10-CM | POA: Diagnosis present

## 2023-03-04 HISTORY — DX: Malignant neoplasm of rectum: C20

## 2023-03-04 HISTORY — DX: Chronic obstructive pulmonary disease, unspecified: J44.9

## 2023-03-04 HISTORY — DX: Pulmonary fibrosis, unspecified: J84.10

## 2023-03-04 LAB — URINALYSIS, W/ REFLEX TO CULTURE (INFECTION SUSPECTED)
Bilirubin Urine: NEGATIVE
Glucose, UA: NEGATIVE mg/dL
Hgb urine dipstick: NEGATIVE
Ketones, ur: NEGATIVE mg/dL
Nitrite: NEGATIVE
Protein, ur: 100 mg/dL — AB
Specific Gravity, Urine: 1.008 (ref 1.005–1.030)
pH: 6 (ref 5.0–8.0)

## 2023-03-04 LAB — COMPREHENSIVE METABOLIC PANEL
ALT: 13 U/L (ref 0–44)
AST: 17 U/L (ref 15–41)
Albumin: 3.1 g/dL — ABNORMAL LOW (ref 3.5–5.0)
Alkaline Phosphatase: 100 U/L (ref 38–126)
Anion gap: 7 (ref 5–15)
BUN: 33 mg/dL — ABNORMAL HIGH (ref 8–23)
CO2: 20 mmol/L — ABNORMAL LOW (ref 22–32)
Calcium: 8.6 mg/dL — ABNORMAL LOW (ref 8.9–10.3)
Chloride: 111 mmol/L (ref 98–111)
Creatinine, Ser: 1.55 mg/dL — ABNORMAL HIGH (ref 0.44–1.00)
GFR, Estimated: 36 mL/min — ABNORMAL LOW (ref >60)
Glucose, Bld: 93 mg/dL (ref 70–99)
Potassium: 3.8 mmol/L (ref 3.5–5.1)
Sodium: 138 mmol/L (ref 135–145)
Total Bilirubin: 0.8 mg/dL (ref 0.3–1.2)
Total Protein: 6.7 g/dL (ref 6.5–8.1)

## 2023-03-04 LAB — TROPONIN I (HIGH SENSITIVITY): Troponin I (High Sensitivity): 12 ng/L (ref <18)

## 2023-03-04 LAB — CK: Total CK: 58 U/L (ref 38–234)

## 2023-03-04 LAB — SARS CORONAVIRUS 2 BY RT PCR: SARS Coronavirus 2 by RT PCR: NEGATIVE

## 2023-03-04 LAB — CBC
HCT: 31.9 % — ABNORMAL LOW (ref 36.0–46.0)
Hemoglobin: 11.5 g/dL — ABNORMAL LOW (ref 12.0–15.0)
MCH: 32 pg (ref 26.0–34.0)
MCHC: 36.1 g/dL — ABNORMAL HIGH (ref 30.0–36.0)
MCV: 88.9 fL (ref 80.0–100.0)
Platelets: 163 10*3/uL (ref 150–400)
RBC: 3.59 MIL/uL — ABNORMAL LOW (ref 3.87–5.11)
RDW: 15.9 % — ABNORMAL HIGH (ref 11.5–15.5)
WBC: 5.5 10*3/uL (ref 4.0–10.5)
nRBC: 0 % (ref 0.0–0.2)

## 2023-03-04 LAB — GLUCOSE, CAPILLARY
Glucose-Capillary: 169 mg/dL — ABNORMAL HIGH (ref 70–99)
Glucose-Capillary: 217 mg/dL — ABNORMAL HIGH (ref 70–99)

## 2023-03-04 MED ORDER — ALBUTEROL SULFATE (2.5 MG/3ML) 0.083% IN NEBU: 2.5000 mg | INHALATION_SOLUTION | RESPIRATORY_TRACT | Status: DC | PRN

## 2023-03-04 MED ORDER — INFLUENZA VAC A&B SURF ANT ADJ 0.5 ML IM SUSY
0.5000 mL | PREFILLED_SYRINGE | INTRAMUSCULAR | Status: AC
  Administered 2023-03-05: 0.5 mL via INTRAMUSCULAR
  Filled 2023-03-04: qty 0.5

## 2023-03-04 MED ORDER — HYDROCODONE-ACETAMINOPHEN 5-325 MG PO TABS: 1.0000 | ORAL_TABLET | Freq: Four times a day (QID) | ORAL | Status: DC | PRN

## 2023-03-04 MED ORDER — PIRFENIDONE 801 MG PO TABS
1.0000 | ORAL_TABLET | Freq: Three times a day (TID) | ORAL | Status: DC
  Administered 2023-03-05 – 2023-03-08 (×10): 801 mg via ORAL
  Filled 2023-03-04 (×10): qty 1

## 2023-03-04 MED ORDER — DICLOFENAC SODIUM 1 % EX GEL: 2.0000 g | Freq: Three times a day (TID) | CUTANEOUS | Status: DC | PRN

## 2023-03-04 MED ORDER — ROSUVASTATIN CALCIUM 20 MG PO TABS
20.0000 mg | ORAL_TABLET | Freq: Every day | ORAL | Status: DC
  Administered 2023-03-04 – 2023-03-05 (×2): 20 mg via ORAL
  Filled 2023-03-04 (×2): qty 1

## 2023-03-04 MED ORDER — CIPROFLOXACIN HCL 500 MG PO TABS
250.0000 mg | ORAL_TABLET | Freq: Two times a day (BID) | ORAL | Status: AC
  Administered 2023-03-04 – 2023-03-07 (×6): 250 mg via ORAL
  Filled 2023-03-04 (×6): qty 1

## 2023-03-04 MED ORDER — ASPIRIN 81 MG PO TBEC
81.0000 mg | DELAYED_RELEASE_TABLET | Freq: Every day | ORAL | Status: DC
  Administered 2023-03-05 – 2023-03-08 (×4): 81 mg via ORAL
  Filled 2023-03-04 (×4): qty 1

## 2023-03-04 MED ORDER — INSULIN GLARGINE-YFGN 100 UNIT/ML ~~LOC~~ SOLN
18.0000 [IU] | Freq: Every day | SUBCUTANEOUS | Status: DC
  Filled 2023-03-04: qty 0.18

## 2023-03-04 MED ORDER — PANTOPRAZOLE SODIUM 40 MG PO TBEC
40.0000 mg | DELAYED_RELEASE_TABLET | Freq: Every day | ORAL | Status: DC
  Administered 2023-03-04 – 2023-03-08 (×5): 40 mg via ORAL
  Filled 2023-03-04 (×5): qty 1

## 2023-03-04 MED ORDER — SODIUM CHLORIDE 0.9 % IV BOLUS
1000.0000 mL | Freq: Once | INTRAVENOUS | Status: AC
  Administered 2023-03-04: 1000 mL via INTRAVENOUS

## 2023-03-04 MED ORDER — SENNOSIDES-DOCUSATE SODIUM 8.6-50 MG PO TABS: 1.0000 | ORAL_TABLET | Freq: Every evening | ORAL | Status: DC | PRN

## 2023-03-04 MED ORDER — GABAPENTIN 100 MG PO CAPS
100.0000 mg | ORAL_CAPSULE | Freq: Two times a day (BID) | ORAL | Status: DC
  Administered 2023-03-04 – 2023-03-08 (×8): 100 mg via ORAL
  Filled 2023-03-04 (×8): qty 1

## 2023-03-04 MED ORDER — STROKE: EARLY STAGES OF RECOVERY BOOK: Freq: Once | Status: AC

## 2023-03-04 MED ORDER — SERTRALINE HCL 50 MG PO TABS
50.0000 mg | ORAL_TABLET | Freq: Every day | ORAL | Status: DC
  Administered 2023-03-04 – 2023-03-08 (×5): 50 mg via ORAL
  Filled 2023-03-04 (×5): qty 1

## 2023-03-04 MED ORDER — ACETAMINOPHEN 650 MG RE SUPP: 650.0000 mg | RECTAL | Status: DC | PRN

## 2023-03-04 MED ORDER — HYDRALAZINE HCL 20 MG/ML IJ SOLN
5.0000 mg | Freq: Four times a day (QID) | INTRAMUSCULAR | Status: AC | PRN
  Administered 2023-03-04: 5 mg via INTRAVENOUS
  Filled 2023-03-04: qty 1

## 2023-03-04 MED ORDER — ASPIRIN 81 MG PO CHEW
324.0000 mg | CHEWABLE_TABLET | Freq: Once | ORAL | Status: AC
  Administered 2023-03-04: 324 mg via ORAL
  Filled 2023-03-04: qty 4

## 2023-03-04 MED ORDER — ACETAMINOPHEN 325 MG PO TABS
650.0000 mg | ORAL_TABLET | ORAL | Status: DC | PRN
  Administered 2023-03-04 – 2023-03-07 (×4): 650 mg via ORAL
  Filled 2023-03-04 (×4): qty 2

## 2023-03-04 MED ORDER — SACUBITRIL-VALSARTAN 49-51 MG PO TABS
1.0000 | ORAL_TABLET | Freq: Two times a day (BID) | ORAL | Status: DC
  Administered 2023-03-04 – 2023-03-08 (×8): 1 via ORAL
  Filled 2023-03-04 (×9): qty 1

## 2023-03-04 MED ORDER — INSULIN ASPART 100 UNIT/ML IJ SOLN
0.0000 [IU] | Freq: Every day | INTRAMUSCULAR | Status: DC
  Administered 2023-03-04 – 2023-03-07 (×3): 2 [IU] via SUBCUTANEOUS
  Filled 2023-03-04 (×3): qty 1

## 2023-03-04 MED ORDER — TRAZODONE HCL 50 MG PO TABS
100.0000 mg | ORAL_TABLET | Freq: Every day | ORAL | Status: DC
  Administered 2023-03-04 – 2023-03-07 (×4): 100 mg via ORAL
  Filled 2023-03-04 (×4): qty 2

## 2023-03-04 MED ORDER — INSULIN ASPART 100 UNIT/ML IJ SOLN
0.0000 [IU] | Freq: Three times a day (TID) | INTRAMUSCULAR | Status: DC
  Administered 2023-03-05: 2 [IU] via SUBCUTANEOUS
  Administered 2023-03-06: 3 [IU] via SUBCUTANEOUS
  Administered 2023-03-06 (×2): 1 [IU] via SUBCUTANEOUS
  Administered 2023-03-07 – 2023-03-08 (×4): 2 [IU] via SUBCUTANEOUS
  Filled 2023-03-04 (×8): qty 1

## 2023-03-04 MED ORDER — ACETAMINOPHEN 160 MG/5ML PO SOLN: 650.0000 mg | ORAL | Status: DC | PRN

## 2023-03-04 MED ORDER — AMLODIPINE BESYLATE 10 MG PO TABS
10.0000 mg | ORAL_TABLET | Freq: Every day | ORAL | Status: DC
  Administered 2023-03-04 – 2023-03-08 (×5): 10 mg via ORAL
  Filled 2023-03-04: qty 1
  Filled 2023-03-04: qty 2
  Filled 2023-03-04 (×3): qty 1

## 2023-03-04 MED ORDER — VENLAFAXINE HCL ER 75 MG PO CP24
150.0000 mg | ORAL_CAPSULE | Freq: Every day | ORAL | Status: DC
  Administered 2023-03-04 – 2023-03-08 (×5): 150 mg via ORAL
  Filled 2023-03-04 (×4): qty 2
  Filled 2023-03-04: qty 1
  Filled 2023-03-04: qty 2

## 2023-03-04 NOTE — ED Provider Notes (Signed)
[facility] Provider Note    Event Date/Time   First MD Initiated Contact with Patient 03/04/23 0901     (approximate)   History   Altered Mental Status   HPI  AYN CARRITHERS is a 66 y.o. female past medical history significant for prior CVA with residual left-sided weakness, who presents to the emergency department with altered mental status.  Patient has been living with her sister recently.  States that this morning when she went to wake her up she was unable to wake her up and she was nonresponsive.  Would not open her eyes or wake up so she called 911.  And EMS arrived patient states that she has been having severe dizziness.  Feels like the room is spinning and like she is going to pass out.  Does endorse some change in vision and feels like her eyes are glossy.  Denies any new weakness.  Denies any chest pain or shortness of breath.  Sister arrived and stated that yesterday noted some abnormalities like she made a cup of coffee which she never drinks and then forgot about it.  Not on anticoagulation.     Physical Exam   Triage Vital Signs: ED Triage Vitals  Encounter Vitals Group     BP 03/04/23 0845 (!) 182/93     Systolic BP Percentile --      Diastolic BP Percentile --      Pulse Rate 03/04/23 0845 64     Resp 03/04/23 0845 19     Temp 03/04/23 0845 97.9 F (36.6 C)     Temp Source 03/04/23 0845 Oral     SpO2 03/04/23 0845 100 %     Weight 03/04/23 0905 166 lb 14.2 oz (75.7 kg)     Height 03/04/23 0905 5\' 3"  (1.6 m)     Head Circumference --      Peak Flow --      Pain Score 03/04/23 0845 0     Pain Loc --      Pain Education --      Exclude from Growth Chart --     Most recent vital signs: Vitals:   03/04/23 1500 03/04/23 1536  BP: (!) 212/120 (!) 168/70  Pulse: 63 65  Resp: (!) 23 (!) 23  Temp:    SpO2: 100% 100%    Physical Exam Constitutional:      Appearance: She is well-developed.  HENT:     Head: Atraumatic.  Eyes:      Conjunctiva/sclera: Conjunctivae normal.  Cardiovascular:     Rate and Rhythm: Regular rhythm.  Pulmonary:     Effort: No respiratory distress.  Abdominal:     General: There is no distension.  Musculoskeletal:        General: Normal range of motion.     Cervical back: Normal range of motion.  Skin:    General: Skin is warm.  Neurological:     Mental Status: She is alert. Mental status is at baseline.     Cranial Nerves: Cranial nerves 2-12 are intact.     Sensory: Sensation is intact.     Motor: Motor function is intact.     Coordination: Finger-Nose-Finger Test abnormal.     IMPRESSION / MDM / ASSESSMENT AND PLAN / ED COURSE  I reviewed the triage vital signs and the nursing notes.  Differential diagnosis including intracranial hemorrhage, CVA, posterior circulation CVA/dissection, peripheral vertigo, dehydration, infectious process  EKG  I, Wes Lezotte, the  attending physician, personally viewed and interpreted this ECG.   Rate: Normal  Rhythm: Normal sinus  Axis: Normal  Intervals: Normal  ST&T Change: None  No tachycardic or bradycardic dysrhythmias while on cardiac telemetry.  RADIOLOGY I independently reviewed imaging, my interpretation of imaging: CT scan of the head with no signs of intracranial hemorrhage or infarction.  LABS (all labs ordered are listed, but only abnormal results are displayed) Labs interpreted as -    Labs Reviewed  CBC - Abnormal; Notable for the following components:      Result Value   RBC 3.59 (*)    Hemoglobin 11.5 (*)    HCT 31.9 (*)    MCHC 36.1 (*)    RDW 15.9 (*)    All other components within normal limits  COMPREHENSIVE METABOLIC PANEL - Abnormal; Notable for the following components:   CO2 20 (*)    BUN 33 (*)    Creatinine, Ser 1.55 (*)    Calcium 8.6 (*)    Albumin 3.1 (*)    GFR, Estimated 36 (*)    All other components within normal limits  URINALYSIS, W/ REFLEX TO CULTURE (INFECTION SUSPECTED) -  Abnormal; Notable for the following components:   Color, Urine STRAW (*)    APPearance CLEAR (*)    Protein, ur 100 (*)    Leukocytes,Ua TRACE (*)    Bacteria, UA RARE (*)    All other components within normal limits  SARS CORONAVIRUS 2 BY RT PCR  CK  URINE DRUG SCREEN, QUALITATIVE ([facility] ONLY)  VITAMIN B12  TROPONIN I (HIGH SENSITIVITY)     MDM  EKG with no acute findings.  No leukocytosis.  Anemia but does not have a drop of her hemoglobin.  No signs of urinary tract infection.  COVID-negative.  Low suspicion for ACS, troponin negative.  Chest x-ray with no findings of pneumonia.  CT scan of the head without acute findings.  Given IV fluids  On reevaluation did have improvement of her dizziness however has ongoing room spinning dizziness.  Concern for possible posterior circulation CVA.  No recent neck manipulation and have a lower suspicion for dissection.  Consulted hospitalist for further stroke workup.  PROCEDURES:  Critical Care performed: No  Procedures  Patient's presentation is most consistent with acute presentation with potential threat to life or bodily function.   MEDICATIONS ORDERED IN ED: Medications   stroke: early stages of recovery book (has no administration in time range)  acetaminophen (TYLENOL) tablet 650 mg (has no administration in time range)    Or  acetaminophen (TYLENOL) 160 MG/5ML solution 650 mg (has no administration in time range)    Or  acetaminophen (TYLENOL) suppository 650 mg (has no administration in time range)  senna-docusate (Senokot-S) tablet 1 tablet (has no administration in time range)  hydrALAZINE (APRESOLINE) injection 5 mg (5 mg Intravenous Given 03/04/23 1509)  aspirin EC tablet 81 mg (has no administration in time range)  HYDROcodone-acetaminophen (NORCO/VICODIN) 5-325 MG per tablet 1 tablet (has no administration in time range)  amLODipine (NORVASC) tablet 10 mg (10 mg Oral Given 03/04/23 1538)  sacubitril-valsartan  (ENTRESTO) 49-51 mg per tablet (has no administration in time range)  rosuvastatin (CRESTOR) tablet 20 mg (has no administration in time range)  sertraline (ZOLOFT) tablet 50 mg (50 mg Oral Given 03/04/23 1538)  traZODone (DESYREL) tablet 100 mg (has no administration in time range)  venlafaxine XR (EFFEXOR-XR) 24 hr capsule 150 mg (has no administration in time range)  insulin glargine-yfgn (  SEMGLEE) injection 18 Units (has no administration in time range)  pantoprazole (PROTONIX) EC tablet 40 mg (has no administration in time range)  gabapentin (NEURONTIN) capsule 100 mg (has no administration in time range)  albuterol (PROVENTIL) (2.5 MG/3ML) 0.083% nebulizer solution 2.5 mg (has no administration in time range)  diclofenac Sodium (VOLTAREN) 1 % topical gel 2 g (has no administration in time range)  sodium chloride 0.9 % bolus 1,000 mL (1,000 mLs Intravenous New Bag/Given 03/04/23 0954)  aspirin chewable tablet 324 mg (324 mg Oral Given 03/04/23 1351)    FINAL CLINICAL IMPRESSION(S) / ED DIAGNOSES   Final diagnoses:  Altered mental status, unspecified altered mental status type  Dizzy  Secondary hypertension     Rx / DC Orders   ED Discharge Orders     None        Note:  This document was prepared using Dragon voice recognition software and may include unintentional dictation errors.   Corena Herter, MD 03/04/23 [phone]

## 2023-03-04 NOTE — Plan of Care (Signed)
  Problem: Ischemic Stroke/TIA Tissue Perfusion: Goal: Complications of ischemic stroke/TIA will be minimized Outcome: Progressing   Problem: Education: Goal: Knowledge of disease or condition will improve Outcome: Progressing Goal: Knowledge of secondary prevention will improve (MUST DOCUMENT ALL) Outcome: Progressing Goal: Knowledge of patient specific risk factors will improve Loraine Leriche N/A or DELETE if not current risk factor) Outcome: Progressing   Problem: Coping: Goal: Will verbalize positive feelings about self Outcome: Progressing Goal: Will identify appropriate support needs Outcome: Progressing   Problem: Health Behavior/Discharge Planning: Goal: Ability to manage health-related needs will improve Outcome: Progressing Goal: Goals will be collaboratively established with patient/family Outcome: Progressing   Problem: Self-Care: Goal: Ability to participate in self-care as condition permits will improve Outcome: Progressing Goal: Verbalization of feelings and concerns over difficulty with self-care will improve Outcome: Progressing Goal: Ability to communicate needs accurately will improve Outcome: Progressing   Problem: Nutrition: Goal: Risk of aspiration will decrease Outcome: Progressing Goal: Dietary intake will improve Outcome: Progressing   Problem: Health Behavior/Discharge Planning: Goal: Ability to manage health-related needs will improve Outcome: Progressing   Problem: Clinical Measurements: Goal: Ability to maintain clinical measurements within normal limits will improve Outcome: Progressing Goal: Will remain free from infection Outcome: Progressing Goal: Diagnostic test results will improve Outcome: Progressing Goal: Respiratory complications will improve Outcome: Progressing Goal: Cardiovascular complication will be avoided Outcome: Progressing   Problem: Activity: Goal: Risk for activity intolerance will decrease Outcome: Progressing    Problem: Nutrition: Goal: Adequate nutrition will be maintained Outcome: Progressing   Problem: Elimination: Goal: Will not experience complications related to bowel motility Outcome: Progressing Goal: Will not experience complications related to urinary retention Outcome: Progressing   Problem: Pain Managment: Goal: General experience of comfort will improve Outcome: Progressing   Problem: Safety: Goal: Ability to remain free from injury will improve Outcome: Progressing   Problem: Education: Goal: Ability to describe self-care measures that may prevent or decrease complications (Diabetes Survival Skills Education) will improve Outcome: Progressing Goal: Individualized Educational Video(s) Outcome: Progressing   Problem: Skin Integrity: Goal: Risk for impaired skin integrity will decrease Outcome: Progressing

## 2023-03-04 NOTE — Assessment & Plan Note (Addendum)
Altered mental status with initial right-sided mouth droop, very mild Portable chest x-ray ordered, stat Stroke-like symptoms MRI of the brain, complete echo Neurology service has been consulted, Dr. Wilford Corner is aware Fasting lipid and A1c ordered Frequent neuro vascular checks Heart healthy/carb modified PT, OT, SLP Fall precaution and aspiration precaution

## 2023-03-04 NOTE — Assessment & Plan Note (Signed)
Home sertraline 50 mg daily, venlafaxine 150 mg daily, trazodone 100 mg nightly were resumed on admission

## 2023-03-04 NOTE — Assessment & Plan Note (Signed)
Home aspirin 81 mg daily, rosuvastatin 20 mg nightly were resumed on admission

## 2023-03-04 NOTE — Assessment & Plan Note (Signed)
Home pirfenidone 801 mg p.o. 3 times daily with meals resumed on admission

## 2023-03-04 NOTE — H&P (Addendum)
History and Physical   Erica [name]:[id number] DOB: 03-18-1956 DOA: 03/04/2023  PCP: Fleet Contras, MD  Outpatient Specialists: Dr. Seward Carol pulmonary care Patient coming from: Home via EMS  I have personally briefly reviewed patient's old medical records in [facility] EMR.  Chief Concern: Altered mental status  HPI: Erica [name] is a 67 year old female with history of CVA with left-sided residual deficits, insulin-dependent diabetes mellitus, depression, anxiety, hyperlipidemia, GERD, who presents emergency department for chief concerns of altered mental status.  Vitals in the ED showed temperature of 97.9, respiration rate of 20, heart rate of 58, blood pressure 160/92, SpO2 of 100% on room air.  sNa is 138, potassium 3.8, chloride 111, bicarb 20, BUN 33, serum creatinine 1.55, EGFR of 36, nonfasting blood glucose 93, WBC 5.5, hemoglobin 11.5, platelets of 163.  CK is 58.  Troponin is 12.  UA was positive for trace leukocytes. COVID PCR was negative.  ED treatment: 1 L bolus.  Aspirin 324 mg p.o. one-time dose.  Complete echo ordered. ------------------------- At bedside, patient was able to tell me her name, age, location, current calendar year.  She was not able to tell me the reason why she is in the hospital.  She states she wants to go home now because she wants food.  Her sister is at bedside and states that this morning, she did not wake up.  It was difficult for her [name] to arouse her sister and so she called EMS.  Per sister, when EMS arrived they checked her blood glucose and it was 102.  Sister states that patient takes her own medication.  Patient was able to follow commands and currently does not appear to be in acute distress.  Social history: She lives at home with her sister.  She denies tobacco, EtOH, recreational drug use.  ROS: Constitutional: no weight change, no fever ENT/Mouth: no sore throat, no rhinorrhea Eyes: no eye pain, no  vision changes Cardiovascular: no chest pain, no dyspnea,  no edema, no palpitations Respiratory: no cough, no sputum, no wheezing Gastrointestinal: no nausea, no vomiting, no diarrhea, no constipation Genitourinary: no urinary incontinence, no dysuria, no hematuria Musculoskeletal: no arthralgias, no myalgias Skin: no skin lesions, no pruritus, Neuro: no weakness, no loss of consciousness, no syncope Psych: no anxiety, no depression, no decrease appetite Heme/Lymph: no bruising, no bleeding  ED Course: Discussed with emergency medicine provider, patient requiring hospitalization for chief concerns of altered mental status, concerning for acute stroke.  Assessment/Plan  Principal Problem:   Stroke-like symptom Active Problems:   Interstitial lung disease (HCC)   COPD (chronic obstructive pulmonary disease) (HCC)   MDD (major depressive disorder), recurrent severe, without psychosis (HCC)   CVA (cerebral vascular accident) (HCC)   Benign essential HTN   Controlled type 2 diabetes mellitus with hyperglycemia, with long-term current use of insulin (HCC)   Assessment and Plan:  * Stroke-like symptom Altered mental status with initial right-sided mouth droop, very mild Portable chest x-ray ordered, stat Stroke-like symptoms MRI of the brain, complete echo Neurology service has been consulted, Dr. Wilford Corner is aware Fasting lipid and A1c ordered Frequent neuro vascular checks Heart healthy/carb modified PT, OT, SLP Fall precaution and aspiration precaution  Controlled type 2 diabetes mellitus with hyperglycemia, with long-term current use of insulin (HCC) Patient reports that she has been prescribed a long-acting insulin 20 units in the morning and 18 units at night Given that patient had a glucose of 102 with EMS and is currently  93 on CMP, I have not continued long-acting insulin on admission AM team to resume home long-acting insulin when the benefits outweigh the risk Insulin SSI  with at bedtime coverage ordered, renal dosing Goal inpatient blood glucose level is 140-180  Benign essential HTN With history of amlodipine 10 mg daily, Entresto 49-51 mg p.o. twice daily resumed Hydralazine 5 mg IV every 6 hours as needed for SBP greater than 190, 1 day ordered pending MRI AM team to adjust as needed antihypertensive medication as appropriate  CVA (cerebral vascular accident) (HCC) Home aspirin 81 mg daily, rosuvastatin 20 mg nightly were resumed on admission  MDD (major depressive disorder), recurrent severe, without psychosis (HCC) Home sertraline 50 mg daily, venlafaxine 150 mg daily, trazodone 100 mg nightly were resumed on admission  COPD (chronic obstructive pulmonary disease) (HCC) Does not appear to be in acute exacerbation Home albuterol inhalation every 6 hours as needed for wheezing and shortness of breath  Interstitial lung disease (HCC) Home pirfenidone 801 mg p.o. 3 times daily with meals resumed on admission  Chart reviewed.   DVT prophylaxis: TED hose; AM team to initiate pharmacologic DVT prophylaxis when the benefits outweigh the risks. Code Status: DNR/DNI, confirmed with patient at bedside.  Sister, Erica [name] in the room as witness. Diet: N.p.o. 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   GERD (gastroesophageal reflux disease)    Headache(784.0)    "mild"   Hypertension    Loop Biotronik loop implant 12/16/2020 12/16/2020   Mental disorder    depression   Neuropathy    feet    Pain    arthritis pain - takes tramadol as needed   Rectal polyp    very little bleeding with bowel movements- no pain   Stroke  (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   Suicide attempt [facility])    Past Surgical History:  Procedure Laterality Date   ANAL RECTAL MANOMETRY N/A 07/13/2016   Procedure: ANO RECTAL MANOMETRY;  Surgeon: Romie Levee, MD;  Location: [facility] ENDOSCOPY;  Service: Endoscopy;  Laterality: N/A;   CESAREAN SECTION     EUS N/A 11/21/2012   Procedure: LOWER ENDOSCOPIC ULTRASOUND (EUS);  Surgeon: Willis Modena, MD;  Location: [facility];  Service: Endoscopy;  Laterality: N/A;   FLEXIBLE SIGMOIDOSCOPY N/A 11/21/2012   Procedure: FLEXIBLE SIGMOIDOSCOPY;  Surgeon: Willis Modena, MD;  Location: [facility] ENDOSCOPY;  Service: Endoscopy;  Laterality: N/A;   FLEXIBLE SIGMOIDOSCOPY N/A 01/28/2013   Procedure: FLEXIBLE SIGMOIDOSCOPY;  Surgeon: Romie Levee, MD;  Location: [facility] ENDOSCOPY;  Service: Endoscopy;  Laterality: N/A;   LAPAROSCOPIC LOW ANTERIOR RESECTION N/A 01/29/2013   Procedure: LAPAROSCOPIC LOW ANTERIOR RESECTION, Rigid Proctoscopy;  Surgeon: Romie Levee, MD;  Location: [facility] ORS;  Service: General;  Laterality: N/A;   LAPAROSCOPIC SIGMOID COLECTOMY N/A 11/14/2012   Procedure: DIAGNOSTIC LAPAROSCOPY AND SIGMOIDMOIDOSCOPY ;  Surgeon: Emelia Loron, MD;  Location: [facility] ORS;  Service: General;  Laterality: N/A;   RECTAL ULTRASOUND N/A 07/13/2016   Procedure: RECTAL ULTRASOUND;  Surgeon: Romie Levee, MD;  Location: [facility] ENDOSCOPY;  Service: Endoscopy;  Laterality: N/A;   TONSILLECTOMY     TONSILLECTOMY AND ADENOIDECTOMY     Social History:  reports that she quit smoking about 5 years ago.  Her smoking use included cigarettes. She started smoking about 25 years ago. She has a 10 pack-year smoking history. She has never used smokeless tobacco. She reports that she does not currently use alcohol. She reports that she does not currently use drugs after having used the following drugs: "Crack" cocaine.  Allergies  Allergen Reactions   Penicillins Hives, Itching and Swelling    Tongue swells up Has patient had a PCN reaction causing immediate  rash, facial/tongue/throat swelling, SOB or lightheadedness with hypotension: Yes Has patient had a PCN reaction causing severe rash involving mucus membranes or skin necrosis: Yes Has patient had a PCN reaction that required hospitalization Yes Has patient had a PCN reaction occurring within the last 10 years: No If all of the above answers are "NO", then may proceed with Cephalosporin use.    Family History  Problem Relation Age of Onset   Hypertension Father    Stroke Father    Diabetes Father    Heart disease Mother    Hypertension Mother    Hyperlipidemia Mother    Hypertension Sister    Hypertension Brother    Hypertension Sister    Hypertension Brother    Cancer Other 66       colon cancer    Cancer Maternal Aunt        cancer, unknown type    Cancer Maternal Uncle        bone cancer    Cancer Paternal Aunt        lung cancer   Cancer Paternal Uncle        lung cancer   Cancer Maternal Uncle        colon cancer and prostate cancer    Cancer Maternal Uncle        prostate cancer    Family history: Family history reviewed and not pertinent.  Prior to Admission medications   Medication Sig Start Date End Date Taking? Authorizing Provider  acetaminophen (TYLENOL) 325 MG tablet Take 2 tablets (650 mg total) by mouth every 4 (four) hours as needed for mild pain (or temp > 37.5 C (99.5 F)). 07/14/20   Angiulli, Mcarthur Rossetti, PA-C  albuterol (VENTOLIN HFA) 108 (90 Base) MCG/ACT inhaler Inhale 2 puffs into the lungs every 4 (four) hours as needed for wheezing. 11/20/20   Angiulli, Mcarthur Rossetti, PA-C  ALBUTEROL IN Inhale into the lungs. 09/22/22   [provider]  amLODipine (NORVASC) 10 MG tablet Take 1 tablet (10 mg total) by mouth daily. 11/20/20   Angiulli, Mcarthur Rossetti, PA-C  aspirin EC 81 MG EC tablet Take 1 tablet (81 mg total) by mouth daily. Swallow whole. 11/07/20   Almon Hercules, MD  Budesonide-Formoterol Fumarate (SYMBICORT IN) INHALE 09/22/22   [provider]   cetirizine (ZYRTEC) 10 MG tablet SMARTSIG:1 Tablet(s) By Mouth Every Evening PRN 09/09/22   [provider]  clopidogrel (PLAVIX) 75 MG tablet Take by mouth. 06/30/22   [provider]  diclofenac Sodium (VOLTAREN) 1 % GEL SMARTSIG:4 Gram(s) Topical 4 Times Daily PRN 08/20/22   [provider]  DULoxetine (CYMBALTA) 30 MG capsule Take 1 tablet by mouth daily. 09/22/22   [provider]  ENTRESTO 49-51 MG TAKE 1 TABLET BY MOUTH TWICE DAILY 12/05/22   Tolia, Sunit, DO  famotidine (PEPCID) 20 MG tablet Take 1 tablet (20 mg total) by mouth daily. 11/20/20   Angiulli, Mcarthur Rossetti, PA-C  fenofibrate (TRICOR) 145 MG tablet TAKE 1 TABLET(145 MG) BY MOUTH DAILY Patient taking  differently: Take 145 mg by mouth daily. 09/06/21   Tolia, Sunit, DO  FENOFIBRATE PO Take by mouth. 09/22/22   [provider]  furosemide (LASIX) 20 MG tablet Take 1 tablet (20 mg total) by mouth daily. 01/06/23   Charlynne Pander, MD  gabapentin (NEURONTIN) 100 MG capsule Take 2 capsules (200 mg total) by mouth 2 (two) times daily as needed (pain). Patient taking differently: Take 100 mg by mouth 2 (two) times daily. 11/20/20   Angiulli, Mcarthur Rossetti, PA-C  hydrALAZINE (APRESOLINE) 25 MG tablet Take by mouth. 09/22/22   [provider]  insulin glargine (LANTUS SOLOSTAR) 100 UNIT/ML Solostar Pen Please provide 24 units every a.m. and 20 units every p.m. Patient taking differently: Inject 18-24 Units into the skin See admin instructions. 24 units in the morning 18 units at bedtime 11/20/20   Angiulli, Mcarthur Rossetti, PA-C  Insulin Pen Needle (PEN NEEDLES) 32G X 6 MM MISC 1 application by Does not apply route 2 (two) times daily. 11/20/20   Love, Evlyn Kanner, PA-C  Multiple Vitamin (MULTIVITAMIN ADULT PO) Take 1 tablet by mouth daily. 07/11/19   [provider]  nitrofurantoin, macrocrystal-monohydrate, (MACROBID) 100 MG capsule Take 100 mg by mouth 2 (two) times daily. 11/23/22   [provider]  nystatin (MYCOSTATIN) 100000 UNIT/ML suspension Take 5 mLs by mouth every 6 (six) hours. 11/25/22   [provider]  pantoprazole (PROTONIX) 40 MG tablet Take 40 mg by mouth daily.    [provider]  PANTOPRAZOLE SODIUM PO Take by mouth. 09/22/22   [provider]  Pirfenidone 801 MG TABS TAKE 1 TABLET (801 MG) BY MOUTH WITH BREAKFAST, WITH LUNCH, AND WITH EVENING MEAL. 08/22/22   Kalman Shan, MD  rosuvastatin (CRESTOR) 20 MG tablet Take 1 tablet (20 mg total) by mouth daily. 04/25/22   Tolia, Sunit, DO  rosuvastatin (CRESTOR) 40 MG tablet Take by mouth. 09/22/22   [provider]  sertraline (ZOLOFT) 50 MG tablet Take 50 mg by mouth daily.    [provider]  sulfamethoxazole-trimethoprim (BACTRIM DS) 800-160 MG tablet Take 1 tablet by mouth 2 (two) times daily. 06/02/22   [provider]  SYMBICORT 160-4.5 MCG/ACT inhaler INHALE 2 PUFFS INTO THE LUNGS TWICE DAILY 10/18/21   Kalman Shan, MD  tiotropium (SPIRIVA HANDIHALER) 18 MCG inhalation capsule Place into inhaler and inhale. 09/22/22   [provider]  TIOTROPIUM BROMIDE-OLODATEROL IN INHALE  IN INHALER ORAL INHALATION EVERY MORNING 09/22/22   [provider]  tiZANidine (ZANAFLEX) 4 MG tablet Take 4 mg by mouth 2 (two) times daily as needed. 09/17/22   [provider]  traZODone (DESYREL) 100 MG tablet Take 1 tablet by mouth daily. 09/22/22   [provider]  triamcinolone cream (KENALOG) 0.1 % Apply topically 2 (two) times daily as needed. 08/16/22   [provider]  venlafaxine XR (EFFEXOR-XR) 150 MG 24 hr capsule Take 150 mg by mouth daily. 09/19/21   [provider]   Physical Exam: Vitals:   03/04/23 1430 03/04/23 1500 03/04/23 1536 03/04/23 1649  BP: (!) 191/91 (!) 212/120 (!) 168/70 (!) 189/87  Pulse: (!) 57 63 65 64  Resp: 18 (!) 23 (!) 23 18  Temp:    98.2 F (36.8 C)  TempSrc:      SpO2: 100% 100% 100%  100%  Weight:      Height:       Constitutional: appears older than chronological age, frail, NAD, calm Eyes: PERRL, lids  and conjunctivae normal ENMT: Mucous membranes are moist. Posterior pharynx clear of any exudate or lesions. Age-appropriate dentition. Hearing appropriate Neck: normal, supple, no masses, no thyromegaly Respiratory: clear to auscultation bilaterally, no wheezing, no crackles. Normal respiratory effort. No accessory muscle use.  Cardiovascular: Regular rate and rhythm, no murmurs / rubs / gallops. No extremity edema. 2+ pedal pulses. No carotid bruits.  Abdomen: no tenderness, no masses palpated, no hepatosplenomegaly. Bowel sounds positive.  Musculoskeletal: no clubbing / cyanosis. No joint deformity upper and lower extremities. Good ROM, no contractures, no atrophy. Normal muscle tone.  Skin: no rashes, lesions, ulcers. No induration Neurologic: Sensation intact. Strength 5/5 in all 4.  Psychiatric: Normal judgment and insight. Alert and oriented x 3. Normal mood.   EKG: independently reviewed, showing sinus rhythm with rate of 62, QTc 449  Chest x-ray on Admission: I personally reviewed and I agree with radiologist reading as below.  MR BRAIN WO CONTRAST  Result Date: 03/04/2023 CLINICAL DATA:  Altered mental status, history of CVA with left-sided weakness EXAM: MRI HEAD WITHOUT CONTRAST TECHNIQUE: Multiplanar, multiecho pulse sequences of the brain and surrounding structures were obtained without intravenous contrast. COMPARISON:  CT head obtained earlier the same day, brain MRI 11/23/2021 FINDINGS: Brain: There are small foci of faintly elevated DWI signal in the bilateral centrum semiovale (5-64) with faintly decreased ADC signal consistent with small acute to subacute infarcts. There is no hemorrhage or mass effect There is no other evidence of acute infarct. There is no acute intracranial hemorrhage or extra-axial fluid collection. Background parenchymal volume is  normal. Multiple remote cortical infarcts in involving the bilateral cerebral cortex, deep gray nuclei, and cerebellar hemispheres are again seen. The ventricles are stable in size. There are punctate chronic microhemorrhages in the right paracentral lobule and right cerebellar hemisphere as well as a small amount of chronic blood products involving remote bilateral occipital infarcts. The pituitary and suprasellar region are normal. There is no mass lesion there is no mass effect or midline shift. Vascular: Normal flow voids. Skull and upper cervical spine: Normal marrow signal. Sinuses/Orbits: The paranasal sinuses are clear. The globes and orbits are unremarkable. Other: The mastoid air cells and middle ear cavities are clear. IMPRESSION: 1. Small acute to subacute infarcts in the bilateral centrum semiovale without hemorrhage or mass effect. 2. Multiple remote infarcts in the supratentorial and infratentorial brain as above. Electronically Signed   By: Lesia Hausen M.D.   On: 03/04/2023 16:58   DG Chest Port 1 View  Result Date: 03/04/2023 CLINICAL DATA:  [id number] Altered mental status [id number] EXAM: PORTABLE CHEST 1 VIEW COMPARISON:  January 06, 2023 FINDINGS: The cardiomediastinal silhouette is unchanged in contour.Similar appearance of coarse reticular markings of bilateral lung bases most consistent with underlying pulmonary fibrosis. No pleural effusion. No pneumothorax. No acute pleuroparenchymal abnormality. IMPRESSION: Similar appearance of pulmonary fibrosis. Electronically Signed   By: Meda Klinefelter M.D.   On: 03/04/2023 13:19   CT Head Wo Contrast  Result Date: 03/04/2023 CLINICAL DATA:  Mental status change with unknown cause EXAM: CT HEAD WITHOUT CONTRAST TECHNIQUE: Contiguous axial images were obtained from the base of the skull through the vertex without intravenous contrast. RADIATION DOSE REDUCTION: This exam was performed according to the departmental dose-optimization program which  includes automated exposure control, adjustment of the mA and/or kV according to patient size and/or use of iterative reconstruction technique. COMPARISON:  11/22/2021 FINDINGS: Brain: Multiple chronic infarcts along the bilateral cerebral cortex, at the deep gray nuclei, and  in the bilateral cerebellum. No convincing progression when compared to prior CT and MR imaging. No evidence of acute infarct, hemorrhage, hydrocephalus, mass, or collection. Vascular: No hyperdense vessel or unexpected calcification. Skull: Normal. Negative for fracture or focal lesion. Sinuses/Orbits: No acute finding. IMPRESSION: 1. No acute finding. 2. Multiple small chronic infarcts scattered throughout the brain. Electronically Signed   By: Tiburcio Pea M.D.   On: 03/04/2023 09:59    Labs on Admission: I have personally reviewed following labs CBC: Recent Labs  Lab 03/04/23 0930  WBC 5.5  HGB 11.5*  HCT 31.9*  MCV 88.9  PLT 163   Basic Metabolic Panel: Recent Labs  Lab 03/04/23 0930  NA 138  K 3.8  CL 111  CO2 20*  GLUCOSE 93  BUN 33*  CREATININE 1.55*  CALCIUM 8.6*   GFR: Estimated Creatinine Clearance: 33.8 mL/min (A) (by C-G formula based on SCr of 1.55 mg/dL (H)).  Liver Function Tests: Recent Labs  Lab 03/04/23 0930  AST 17  ALT 13  ALKPHOS 100  BILITOT 0.8  PROT 6.7  ALBUMIN 3.1*   Cardiac Enzymes: Recent Labs  Lab 03/04/23 0930  CKTOTAL 58   Urine analysis:    Component Value Date/Time   COLORURINE STRAW (A) 03/04/2023 0938   APPEARANCEUR CLEAR (A) 03/04/2023 0938   LABSPEC 1.008 03/04/2023 0938   PHURINE 6.0 03/04/2023 0938   GLUCOSEU NEGATIVE 03/04/2023 0938   HGBUR NEGATIVE 03/04/2023 0938   BILIRUBINUR NEGATIVE 03/04/2023 0938   KETONESUR NEGATIVE 03/04/2023 0938   PROTEINUR 100 (A) 03/04/2023 0938   NITRITE NEGATIVE 03/04/2023 0938   LEUKOCYTESUR TRACE (A) 03/04/2023 0938   This document was prepared using Dragon Voice Recognition software and may include  unintentional dictation errors.  Dr. Sedalia Muta [facility]  If 7PM-7AM, please contact overnight-coverage provider If 7AM-7PM, please contact day attending provider [email or web address]  03/04/2023, 5:10 PM

## 2023-03-04 NOTE — ED Triage Notes (Signed)
Pt brought in by EMS from home for alter mental status. Per sister, pot not responded appropriately this morning - wouldn't open her eye. Per EMS, sister also states that patient fixed a cup coffee yesterday morning and she has never drank coffee before. Upon arrival to ED, pt is alert; in NAD; oriented to person & place. Per EMS, pt does have a history of CVA with left sided deficits.

## 2023-03-04 NOTE — Hospital Course (Addendum)
Erica [name] is a 67 year old female with history of CVA with left-sided residual deficits, insulin-dependent diabetes mellitus, depression, anxiety, hyperlipidemia, GERD, who presents emergency department for chief concerns of altered mental status.  Vitals in the ED showed temperature of 97.9, respiration rate of 20, heart rate of 58, blood pressure 160/92, SpO2 of 100% on room air.  sNa is 138, potassium 3.8, chloride 111, bicarb 20, BUN 33, serum creatinine 1.55, EGFR of 36, nonfasting blood glucose 93, WBC 5.5, hemoglobin 11.5, platelets of 163.  CK is 58.  Troponin is 12.  UA was positive for trace leukocytes. COVID PCR was negative.  ED treatment: 1 L bolus.  Aspirin 324 mg p.o. one-time dose.  Complete echo ordered.

## 2023-03-04 NOTE — Assessment & Plan Note (Addendum)
Patient reports that she has been prescribed a long-acting insulin 20 units in the morning and 18 units at night Given that patient had a glucose of 102 with EMS and is currently 93 on CMP, I have not continued long-acting insulin on admission AM team to resume home long-acting insulin when the benefits outweigh the risk Insulin SSI with at bedtime coverage ordered, renal dosing Goal inpatient blood glucose level is 140-180

## 2023-03-04 NOTE — Assessment & Plan Note (Addendum)
With history of amlodipine 10 mg daily, Entresto 49-51 mg p.o. twice daily resumed Hydralazine 5 mg IV every 6 hours as needed for SBP greater than 190, 1 day ordered pending MRI AM team to adjust as needed antihypertensive medication as appropriate

## 2023-03-04 NOTE — Assessment & Plan Note (Addendum)
Does not appear to be in acute exacerbation Home albuterol inhalation every 6 hours as needed for wheezing and shortness of breath

## 2023-03-05 ENCOUNTER — Observation Stay: Payer: Medicare HMO

## 2023-03-05 ENCOUNTER — Observation Stay (HOSPITAL_COMMUNITY)
Admit: 2023-03-05 | Discharge: 2023-03-05 | Disposition: A | Discharge status: Home / Self Care | Payer: Medicare HMO | Attending: Internal Medicine | Admitting: Internal Medicine

## 2023-03-05 DIAGNOSIS — J4489 Other specified chronic obstructive pulmonary disease: Secondary | ICD-10-CM | POA: Diagnosis present

## 2023-03-05 DIAGNOSIS — R54 Age-related physical debility: Secondary | ICD-10-CM | POA: Diagnosis present

## 2023-03-05 DIAGNOSIS — Z794 Long term (current) use of insulin: Secondary | ICD-10-CM

## 2023-03-05 DIAGNOSIS — F332 Major depressive disorder, recurrent severe without psychotic features: Secondary | ICD-10-CM

## 2023-03-05 DIAGNOSIS — E1165 Type 2 diabetes mellitus with hyperglycemia: Secondary | ICD-10-CM

## 2023-03-05 DIAGNOSIS — Z23 Encounter for immunization: Secondary | ICD-10-CM | POA: Diagnosis present

## 2023-03-05 DIAGNOSIS — K219 Gastro-esophageal reflux disease without esophagitis: Secondary | ICD-10-CM | POA: Diagnosis present

## 2023-03-05 DIAGNOSIS — I639 Cerebral infarction, unspecified: Secondary | ICD-10-CM | POA: Diagnosis not present

## 2023-03-05 DIAGNOSIS — G441 Vascular headache, not elsewhere classified: Secondary | ICD-10-CM | POA: Diagnosis not present

## 2023-03-05 DIAGNOSIS — R4182 Altered mental status, unspecified: Secondary | ICD-10-CM | POA: Diagnosis present

## 2023-03-05 DIAGNOSIS — E1140 Type 2 diabetes mellitus with diabetic neuropathy, unspecified: Secondary | ICD-10-CM | POA: Diagnosis present

## 2023-03-05 DIAGNOSIS — E785 Hyperlipidemia, unspecified: Secondary | ICD-10-CM | POA: Diagnosis present

## 2023-03-05 DIAGNOSIS — M19011 Primary osteoarthritis, right shoulder: Secondary | ICD-10-CM | POA: Diagnosis present

## 2023-03-05 DIAGNOSIS — Z1152 Encounter for screening for COVID-19: Secondary | ICD-10-CM | POA: Diagnosis not present

## 2023-03-05 DIAGNOSIS — N1832 Chronic kidney disease, stage 3b: Secondary | ICD-10-CM | POA: Diagnosis present

## 2023-03-05 DIAGNOSIS — I69354 Hemiplegia and hemiparesis following cerebral infarction affecting left non-dominant side: Secondary | ICD-10-CM | POA: Diagnosis not present

## 2023-03-05 DIAGNOSIS — F0150 Vascular dementia without behavioral disturbance: Secondary | ICD-10-CM | POA: Diagnosis present

## 2023-03-05 DIAGNOSIS — E1121 Type 2 diabetes mellitus with diabetic nephropathy: Secondary | ICD-10-CM | POA: Diagnosis not present

## 2023-03-05 DIAGNOSIS — Z79899 Other long term (current) drug therapy: Secondary | ICD-10-CM | POA: Diagnosis not present

## 2023-03-05 DIAGNOSIS — I10 Essential (primary) hypertension: Secondary | ICD-10-CM | POA: Diagnosis not present

## 2023-03-05 DIAGNOSIS — J84112 Idiopathic pulmonary fibrosis: Secondary | ICD-10-CM | POA: Diagnosis present

## 2023-03-05 DIAGNOSIS — R4189 Other symptoms and signs involving cognitive functions and awareness: Secondary | ICD-10-CM | POA: Diagnosis not present

## 2023-03-05 DIAGNOSIS — R1310 Dysphagia, unspecified: Secondary | ICD-10-CM | POA: Diagnosis not present

## 2023-03-05 DIAGNOSIS — I159 Secondary hypertension, unspecified: Secondary | ICD-10-CM | POA: Diagnosis present

## 2023-03-05 DIAGNOSIS — M19012 Primary osteoarthritis, left shoulder: Secondary | ICD-10-CM | POA: Diagnosis present

## 2023-03-05 DIAGNOSIS — I6381 Other cerebral infarction due to occlusion or stenosis of small artery: Secondary | ICD-10-CM | POA: Diagnosis present

## 2023-03-05 DIAGNOSIS — Z66 Do not resuscitate: Secondary | ICD-10-CM | POA: Diagnosis present

## 2023-03-05 DIAGNOSIS — R29810 Facial weakness: Secondary | ICD-10-CM | POA: Diagnosis present

## 2023-03-05 DIAGNOSIS — R29701 NIHSS score 1: Secondary | ICD-10-CM | POA: Diagnosis present

## 2023-03-05 DIAGNOSIS — J439 Emphysema, unspecified: Secondary | ICD-10-CM

## 2023-03-05 DIAGNOSIS — E1122 Type 2 diabetes mellitus with diabetic chronic kidney disease: Secondary | ICD-10-CM | POA: Diagnosis present

## 2023-03-05 DIAGNOSIS — D631 Anemia in chronic kidney disease: Secondary | ICD-10-CM | POA: Diagnosis present

## 2023-03-05 DIAGNOSIS — I6389 Other cerebral infarction: Secondary | ICD-10-CM

## 2023-03-05 DIAGNOSIS — I63311 Cerebral infarction due to thrombosis of right middle cerebral artery: Secondary | ICD-10-CM | POA: Diagnosis not present

## 2023-03-05 DIAGNOSIS — J849 Interstitial pulmonary disease, unspecified: Secondary | ICD-10-CM

## 2023-03-05 LAB — ECHOCARDIOGRAM COMPLETE
AR max vel: 2.27 cm2
AV Area VTI: 2.71 cm2
AV Area mean vel: 2.61 cm2
AV Mean grad: 3 mmHg
AV Peak grad: 6 mmHg
Ao pk vel: 1.22 m/s
Area-P 1/2: 3.12 cm2
Height: 63 in
MV VTI: 2.57 cm2
P 1/2 time: 540 msec
S' Lateral: 3.3 cm
Weight: 2670.21 oz

## 2023-03-05 LAB — LIPID PANEL
Cholesterol: 161 mg/dL (ref 0–200)
HDL: 52 mg/dL (ref >40)
LDL Cholesterol: 86 mg/dL (ref 0–99)
Total CHOL/HDL Ratio: 3.1 RATIO
Triglycerides: 115 mg/dL (ref <150)
VLDL: 23 mg/dL (ref 0–40)

## 2023-03-05 LAB — URINE DRUG SCREEN, QUALITATIVE (ARMC ONLY)
Amphetamines, Ur Screen: NOT DETECTED
Barbiturates, Ur Screen: NOT DETECTED
Benzodiazepine, Ur Scrn: NOT DETECTED
Cannabinoid 50 Ng, Ur ~~LOC~~: NOT DETECTED
Cocaine Metabolite,Ur ~~LOC~~: NOT DETECTED
MDMA (Ecstasy)Ur Screen: NOT DETECTED
Methadone Scn, Ur: NOT DETECTED
Opiate, Ur Screen: NOT DETECTED
Phencyclidine (PCP) Ur S: NOT DETECTED
Tricyclic, Ur Screen: NOT DETECTED

## 2023-03-05 LAB — GLUCOSE, CAPILLARY
Glucose-Capillary: 88 mg/dL (ref 70–99)
Glucose-Capillary: 118 mg/dL — ABNORMAL HIGH (ref 70–99)
Glucose-Capillary: 164 mg/dL — ABNORMAL HIGH (ref 70–99)
Glucose-Capillary: 235 mg/dL — ABNORMAL HIGH (ref 70–99)

## 2023-03-05 LAB — HEMOGLOBIN A1C
Hgb A1c MFr Bld: 6.9 % — ABNORMAL HIGH (ref 4.8–5.6)
Mean Plasma Glucose: 151.33 mg/dL

## 2023-03-05 LAB — VITAMIN B12: Vitamin B-12: 866 pg/mL (ref 180–914)

## 2023-03-05 LAB — TSH: TSH: 0.916 u[IU]/mL (ref 0.350–4.500)

## 2023-03-05 MED ORDER — ENOXAPARIN SODIUM 40 MG/0.4ML IJ SOSY
40.0000 mg | PREFILLED_SYRINGE | Freq: Every day | INTRAMUSCULAR | Status: DC
  Administered 2023-03-05: 40 mg via SUBCUTANEOUS
  Filled 2023-03-05: qty 0.4

## 2023-03-05 MED ORDER — VITAMIN B-12 1000 MCG PO TABS
1000.0000 ug | ORAL_TABLET | Freq: Every day | ORAL | Status: DC
  Administered 2023-03-06 – 2023-03-08 (×3): 1000 ug via ORAL
  Filled 2023-03-05 (×3): qty 1

## 2023-03-05 MED ORDER — ENOXAPARIN SODIUM 40 MG/0.4ML IJ SOSY
40.0000 mg | PREFILLED_SYRINGE | Freq: Every day | INTRAMUSCULAR | Status: DC
  Administered 2023-03-06: 40 mg via SUBCUTANEOUS
  Filled 2023-03-05: qty 0.4

## 2023-03-05 MED ORDER — THIAMINE HCL 100 MG/ML IJ SOLN
500.0000 mg | Freq: Every day | INTRAVENOUS | Status: DC
  Filled 2023-03-05: qty 5

## 2023-03-05 NOTE — Progress Notes (Signed)
  Echocardiogram 2D Echocardiogram has been performed.  Erica [name] 03/05/2023, 9:28 AM

## 2023-03-05 NOTE — Plan of Care (Signed)
Patient understands the signs ans symptoms of a stroke. Patient has a history of CVA with left side weakness as a residual. No distress noted. Will continue to monitor closely.   Problem: Education: Goal: Knowledge of disease or condition will improve Outcome: Progressing Goal: Knowledge of secondary prevention will improve (MUST DOCUMENT ALL) Outcome: Progressing Goal: Knowledge of patient specific risk factors will improve Loraine Leriche N/A or DELETE if not current risk factor) Outcome: Progressing

## 2023-03-05 NOTE — Progress Notes (Signed)
Inpatient Rehab Admissions Coordinator Note:   Per PT patient was screened for CIR candidacy by Tenicia Gural Luvenia Starch, CCC-SLP. At this time, pt appears to be a potential candidate for CIR. I will place an order for rehab consult for full assessment, per our protocol.  Please contact me any with questions.Wolfgang Phoenix, MS, CCC-SLP Admissions Coordinator [phone] 03/05/23 2:51 PM

## 2023-03-05 NOTE — Progress Notes (Signed)
Physical Therapy Evaluation Patient Details Name: Erica [name] MRN: [id number] DOB: 1956/05/23 Today's Date: 03/05/2023  History of Present Illness  Pt is a 67 y/o female admitted secondary to AMS, R facial droop and communication difficulties. MRI of the brain revealed small acute/subacute infarcts of the bilateral centrum semiovale without hemorrhage or mass effect. PMH including but not limited to CVA, DM, anxiety, depression, GERD.  Clinical Impression  Pt presented supine in bed with HOB elevated, awake and willing to participate in therapy session. Pt's older sister was present throughout session as well. Prior to admission, pt reported that she ambulated with use of a cane and was independent with ADLs. Pt is currently living with her older sister while she is waiting for her new apartment to be move-in ready. At her sister's home there is only a single step that pt will need to manage inside to get to the kitchen area. At the time of evaluation, pt with significant limitations in functional mobility due to fatigue and generalized weakness. Pt was able to complete bed mobility with supervision and transfers with CGA with use of RW. Pt was able to take a few side steps bilaterally with RW and CGA, but unable to ambulate any further distance at this time secondary to significant fatigue. Pt would greatly benefit from intensive therapy services after d/c from the hospital to maximize her independence and safety with functional mobility prior to returning home with family support. Pt would continue to benefit from skilled physical therapy services at this time while admitted and after d/c to address the below listed limitations in order to improve overall safety and independence with functional mobility.         If plan is discharge home, recommend the following: A little help with walking and/or transfers;A lot of help with walking and/or transfers;A lot of help with bathing/dressing/bathroom;A  little help with bathing/dressing/bathroom;Assistance with cooking/housework;Direct supervision/assist for medications management;Direct supervision/assist for financial management;Assist for transportation;Help with stairs or ramp for entrance;Supervision due to cognitive status   Can travel by private vehicle   No    Equipment Recommendations Other (comment) (defer to next venue of care; if pt is adamant about returning home, PT recommendations for equipment are: RW, BSC/3-in-1)  Recommendations for Other Services       Functional Status Assessment Patient has had a recent decline in their functional status and demonstrates the ability to make significant improvements in function in a reasonable and predictable amount of time.     Precautions / Restrictions Precautions Precautions: Fall Restrictions Weight Bearing Restrictions: No      Mobility  Bed Mobility Overal bed mobility: Needs Assistance Bed Mobility: Supine to Sit, Sit to Supine     Supine to sit: Supervision Sit to supine: Supervision   General bed mobility comments: greatly increased time needed, max cueing to complete, no physical assistance required    Transfers Overall transfer level: Needs assistance Equipment used: Rolling walker (2 wheels) Transfers: Sit to/from Stand Sit to Stand: Contact guard assist           General transfer comment: pt with slow, steady transitional movement into standing from EOB x2 with appropriate hand placement adn technique utilized with CGA for safety    Ambulation/Gait Ambulation/Gait assistance: Contact guard assist   Assistive device: Rolling walker (2 wheels)   Gait velocity: decr     General Gait Details: pt rapidly fatiguing in standing; she was able to take a few side steps bilaterally at EOB but unable  to ambulate any further secondary to significant fatigue.  Stairs            Wheelchair Mobility     Tilt Bed    Modified Rankin (Stroke  Patients Only) Modified Rankin (Stroke Patients Only) Pre-Morbid Rankin Score: Moderate disability Modified Rankin: Moderately severe disability     Balance Overall balance assessment: Needs assistance Sitting-balance support: Feet supported Sitting balance-Leahy Scale: Fair     Standing balance support: Bilateral upper extremity supported Standing balance-Leahy Scale: Poor                               Pertinent Vitals/Pain Pain Assessment Pain Assessment: No/denies pain    Home Living Family/patient expects to be discharged to:: Private residence Living Arrangements: Other relatives;Other (Comment) (older sister) Available Help at Discharge: Family;Available 24 hours/day Type of Home: House Home Access: Level entry       Home Layout: Other (Comment) (one step down to get to kitchen) Home Equipment: Gilmer Mor - single point      Prior Function Prior Level of Function : Independent/Modified Independent             Mobility Comments: ambulates with use of a cane ADLs Comments: previously independent     Extremity/Trunk Assessment   Upper Extremity Assessment Upper Extremity Assessment: Defer to OT evaluation    Lower Extremity Assessment Lower Extremity Assessment: Generalized weakness;Difficult to assess due to impaired cognition       Communication   Communication Communication: Difficulty communicating thoughts/reduced clarity of speech  Cognition Arousal: Alert Behavior During Therapy: Flat affect Overall Cognitive Status: Impaired/Different from baseline Area of Impairment: Memory, Problem solving                     Memory: Decreased short-term memory       Problem Solving: Slow processing          General Comments      Exercises     Assessment/Plan    PT Assessment Patient needs continued PT services  PT Problem List Decreased strength;Decreased activity tolerance;Decreased balance;Decreased mobility;Decreased  coordination;Decreased cognition;Decreased safety awareness;Decreased knowledge of use of DME       PT Treatment Interventions DME instruction;Stair training;Gait training;Functional mobility training;Therapeutic activities;Therapeutic exercise;Balance training;Neuromuscular re-education;Patient/family education;Cognitive remediation    PT Goals (Current goals can be found in the Care Plan section)  Acute Rehab PT Goals Patient Stated Goal: to get stronger PT Goal Formulation: With patient/family Time For Goal Achievement: 03/19/23 Potential to Achieve Goals: Good    Frequency Min 1X/week     Co-evaluation               AM-PAC PT "6 Clicks" Mobility  Outcome Measure Help needed turning from your back to your side while in a flat bed without using bedrails?: None Help needed moving from lying on your back to sitting on the side of a flat bed without using bedrails?: None Help needed moving to and from a bed to a chair (including a wheelchair)?: A Little Help needed standing up from a chair using your arms (e.g., wheelchair or bedside chair)?: A Little Help needed to walk in hospital room?: A Lot Help needed climbing 3-5 steps with a railing? : Total 6 Click Score: 17    End of Session Equipment Utilized During Treatment: Gait belt Activity Tolerance: Patient limited by fatigue Patient left: in bed;with call bell/phone within reach;with family/visitor present;with nursing/sitter in room  Nurse Communication: Mobility status PT Visit Diagnosis: Other abnormalities of gait and mobility (R26.89)    Time: 4098-1191 PT Time Calculation (min) (ACUTE ONLY): 34 min   Charges:   PT Evaluation $PT Eval Moderate Complexity: 1 Mod PT Treatments $Therapeutic Activity: 8-22 mins PT General Charges $$ ACUTE PT VISIT: 1 Visit         Arletta Bale, DPT  Acute Rehabilitation Services Office [phone]   Alessandra Bevels Estle Sabella 03/05/2023, 12:06 PM

## 2023-03-05 NOTE — PMR Pre-admission (Signed)
PMR Admission Coordinator Pre-Admission Assessment  Patient: Erica [name] is an 67 y.o., female MRN: [id number] DOB: 1955/09/25 Height: 5\' 3"  (160 cm) Weight: 75.7 kg  Insurance Information HMO: yes    PPO:      PCP:      IPA:      80/20:      OTHER:  PRIMARY: [employer]      Policy#: [id number]      Subscriber: patient CM Name: Danella Penton       Phone#: [phone]     Fax#: [phone] Received faxed auth from Fransico Setters on 03/08/23 with Berkley Harvey for admit 6/38-75/6 Pre-Cert#: [id number]      Employer:  Benefits:  Phone #: n/a-online at [email or web address]     Name:  Eff. Date: 06/13/22     Deduct: $240 ($240 met)      Out of Pocket Max: $8,850 ($1,109.06 met)      Life Max: NA CIR: $2,080 co-pay/admission      SNF: 100% for days 1-20, $203/day co-pay for days 21-100 Outpatient: 80% coverage     Co-Pay: 20% co-insurance Home Health: 100% coverage      Co-Pay:  DME: 80% coverage     Co-Pay: 20% co-insurance Providers: in-network SECONDARY: Medicaid [place] [facility]      Policy#: [id number]     Phone#: [phone]  Financial Counselor:       Phone#:   The "Data Collection Information Summary" for patients in Inpatient Rehabilitation Facilities with attached "Privacy Act Statement-Health Care Records" was provided and verbally reviewed with: Patient  Emergency Contact Information Contact Information     Name Relation Home Work [place] Son [phone]        Other Contacts     Name Relation Home Work Mobile   Slaughter Sister [phone]     Jeffries,Crystal Niece   [phone]       Current Medical History  Patient Admitting Diagnosis: CVA History of Present Illness: Pt is a 67 year old female with medical hx significant for: prior CVA with residual left-sided weakness, IDDM, depression, anxiety, hyperlipidemia, GERD. Pt [facility] to [facility] on 03/04/23 d/t AMS. CT negative for acute abnormalities. MRI showed  small acute to subacute infarcts in bilateral centrum semiovale without hemorrhage or mass effect. Therapy evaluations completed and CIR recommended d/t pt's deficits in functional mobility.  Complete NIHSS TOTAL: 2  Patient's medical record from [facility] has been reviewed by the rehabilitation admission coordinator and physician.  Past Medical History  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   GERD (gastroesophageal reflux disease)    Headache(784.0)    "mild"   Hypertension    Loop Biotronik loop implant 12/16/2020 12/16/2020   Mental disorder    depression   Neuropathy    feet    Pain    arthritis pain - takes tramadol as needed   Rectal polyp    very little bleeding with bowel movements- no pain   Stroke (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   Suicide attempt (HCC)     Has the patient had major surgery during 100 days prior to admission? No  Family History   family history includes Cancer in her maternal aunt, maternal uncle, maternal uncle, maternal uncle, paternal aunt, and paternal uncle; Cancer (age of onset: 40) in an other family  member; Diabetes in her father; Heart disease in her mother; Hyperlipidemia in her mother; Hypertension in her brother, brother, father, mother, sister, and sister; Stroke in her father.  Current Medications  Current Facility-Administered Medications:    acetaminophen (TYLENOL) tablet 650 mg, 650 mg, Oral, Q4H PRN, 650 mg at 03/05/23 1203 **OR** acetaminophen (TYLENOL) 160 MG/5ML solution 650 mg, 650 mg, Per Tube, Q4H PRN **OR** acetaminophen (TYLENOL) suppository 650 mg, 650 mg, Rectal, Q4H PRN, Cox, Amy N, DO   albuterol (PROVENTIL) (2.5 MG/3ML) 0.083% nebulizer solution 2.5 mg, 2.5 mg, Inhalation, Q4H PRN, Cox, Amy N, DO   amLODipine (NORVASC) tablet 10 mg, 10 mg, Oral, Daily, Cox, Amy N, DO, 10 mg at 03/05/23 2952   aspirin  EC tablet 81 mg, 81 mg, Oral, Daily, Cox, Amy N, DO, 81 mg at 03/05/23 8413   ciprofloxacin (CIPRO) tablet 250 mg, 250 mg, Oral, BID, Cox, Amy N, DO, 250 mg at 03/05/23 0927   [START ON 03/06/2023] cyanocobalamin (VITAMIN B12) tablet 1,000 mcg, 1,000 mcg, Oral, Daily, Bhagat, Srishti L, MD   diclofenac Sodium (VOLTAREN) 1 % topical gel 2 g, 2 g, Topical, TID PRN, Cox, Amy N, DO   [START ON 03/06/2023] enoxaparin (LOVENOX) injection 40 mg, 40 mg, Subcutaneous, QHS, Bhagat, Srishti L, MD   gabapentin (NEURONTIN) capsule 100 mg, 100 mg, Oral, BID, Cox, Amy N, DO, 100 mg at 03/05/23 2440   HYDROcodone-acetaminophen (NORCO/VICODIN) 5-325 MG per tablet 1 tablet, 1 tablet, Oral, Q6H PRN, Cox, Amy N, DO   insulin aspart (novoLOG) injection 0-5 Units, 0-5 Units, Subcutaneous, QHS, Cox, Amy N, DO, 2 Units at 03/04/23 2107   insulin aspart (novoLOG) injection 0-9 Units, 0-9 Units, Subcutaneous, TID WC, Cox, Amy N, DO   pantoprazole (PROTONIX) EC tablet 40 mg, 40 mg, Oral, Daily, Cox, Amy N, DO, 40 mg at 03/05/23 1027   Pirfenidone TABS 801 mg, 1 tablet, Oral, TID with meals, Cox, Amy N, DO, 801 mg at 03/05/23 1246   rosuvastatin (CRESTOR) tablet 20 mg, 20 mg, Oral, QHS, Cox, Amy N, DO, 20 mg at 03/04/23 2106   sacubitril-valsartan (ENTRESTO) 49-51 mg per tablet, 1 tablet, Oral, BID, Cox, Amy N, DO, 1 tablet at 03/05/23 0930   senna-docusate (Senokot-S) tablet 1 tablet, 1 tablet, Oral, QHS PRN, Cox, Amy N, DO   sertraline (ZOLOFT) tablet 50 mg, 50 mg, Oral, Daily, Cox, Amy N, DO, 50 mg at 03/05/23 0927   [START ON 03/06/2023] thiamine (VITAMIN B1) 500 mg in sodium chloride 0.9 % 50 mL IVPB, 500 mg, Intravenous, Daily, Bhagat, Srishti L, MD   traZODone (DESYREL) tablet 100 mg, 100 mg, Oral, QHS, Cox, Amy N, DO, 100 mg at 03/04/23 2107   venlafaxine XR (EFFEXOR-XR) 24 hr capsule 150 mg, 150 mg, Oral, Daily, Cox, Amy N, DO, 150 mg at 03/05/23 2536  Patients Current Diet:  Diet Order             Diet heart  healthy/carb modified Fluid consistency: Thin  Diet effective now                   Precautions / Restrictions Precautions Precautions: Fall Restrictions Weight Bearing Restrictions: No   Has the patient had 2 or more falls or a fall with injury in the past year? No  Prior Activity Level Community (5-7x/wk): gets out of house often  Prior Functional Level Self Care: Did the patient need help bathing, dressing, using the toilet or eating? Independent  Indoor Mobility: Did the  patient need assistance with walking from room to room (with or without device)? Independent  Stairs: Did the patient need assistance with internal or external stairs (with or without device)? Independent  Functional Cognition: Did the patient need help planning regular tasks such as shopping or remembering to take medications? Independent  Patient Information Are you of Hispanic, Latino/a,or Spanish origin?: A. No, not of Hispanic, Latino/a, or Spanish origin What is your race?: B. Black or African American Do you need or want an interpreter to communicate with a doctor or health care staff?: 0. No  Patient's Response To:  Health Literacy and Transportation Is the patient able to respond to health literacy and transportation needs?: Yes Health Literacy - How often do you need to have someone help you when you read instructions, pamphlets, or other written material from your doctor or pharmacy?: Never In the past 12 months, has lack of transportation kept you from medical appointments or from getting medications?: No In the past 12 months, has lack of transportation kept you from meetings, work, or from getting things needed for daily living?: No  Home Assistive Devices / Equipment Home Assistive Devices/Equipment: Medical laboratory scientific officer (specify quad or straight) Home Equipment: Cane - single point, Shower seat  Prior Device Use: Indicate devices/aids used by the patient prior to current illness, exacerbation or  injury? Walker and cane  Current Functional Level Cognition  Arousal/Alertness: Awake/alert Overall Cognitive Status: Impaired/Different from baseline Orientation Level: Oriented X4 General Comments: Pleasant and cooperative Comments: deferred due to increased lethargy    Extremity Assessment (includes Sensation/Coordination)  Upper Extremity Assessment: Generalized weakness, LUE deficits/detail LUE Deficits / Details: decreased coordination and dexterity but also had prior CVA with residual deficits on L side  Lower Extremity Assessment: Generalized weakness, Difficult to assess due to impaired cognition    ADLs  Overall ADL's : Needs assistance/impaired Grooming: Wash/dry hands, Wash/dry face, Standing, Minimal assistance Toilet Transfer: Moderate assistance, Ambulation, Rolling walker (2 wheels)    Mobility  Overal bed mobility: Needs Assistance Bed Mobility: Supine to Sit, Sit to Supine Supine to sit: Supervision Sit to supine: Supervision General bed mobility comments: increased time and effort to complete    Transfers  Overall transfer level: Needs assistance Equipment used: 1 person hand held assist Transfers: Sit to/from Stand Sit to Stand: Mod assist General transfer comment: pt with slow, steady transitional movement into standing from EOB x2 with appropriate hand placement adn technique utilized with CGA for safety    Ambulation / Gait / Stairs / Wheelchair Mobility  Ambulation/Gait Ambulation/Gait assistance: Contact guard assist Assistive device: Rolling walker (2 wheels) General Gait Details: pt rapidly fatiguing in standing; she was able to take a few side steps bilaterally at EOB but unable to ambulate any further secondary to significant fatigue. Gait velocity: decr    Posture / Balance Balance Overall balance assessment: Needs assistance Sitting-balance support: Feet supported Sitting balance-Leahy Scale: Fair Standing balance support: Bilateral upper  extremity supported, Reliant on assistive device for balance, During functional activity Standing balance-Leahy Scale: Poor    Special needs/care consideration Diabetic management Novolog 0-5 units daily at bedtime; Novolog 0-9 units 3x daily with meals   Previous Home Environment (from acute therapy documentation) Living Arrangements: Other relatives (sister)  Lives With: Family (sister) Available Help at Discharge: Family, Available 24 hours/day Type of Home: House Home Layout: One level (one step from kitchen to another room) Home Access: Level entry Bathroom Shower/Tub: Tub only (sister will take her pt's niece's house which is  next door to take a shower) Bathroom Toilet: Handicapped height Bathroom Accessibility: Yes How Accessible: Accessible via walker Home Care Services: Yes Type of Home Care Services: Homehealth aide (comes 3 days/week)  Discharge Living Setting Plans for Discharge Living Setting: House (sister's house) Type of Home at Discharge: House Discharge Home Layout: One level (one step from kitchen to another room) Discharge Home Access: Level entry Discharge Bathroom Shower/Tub: Tub only (sister will take pt to neice's house next door to take a shower) Discharge Bathroom Toilet: Handicapped height Discharge Bathroom Accessibility: Yes How Accessible: Accessible via walker Does the patient have any problems obtaining your medications?: No  Social/Family/Support Systems Anticipated Caregiver: [name], sister Anticipated Caregiver's Contact Information: [phone] Caregiver Availability: 24/7 Discharge Plan Discussed with Primary Caregiver: Yes Is Caregiver In Agreement with Plan?: Yes Does Caregiver/Family have Issues with Lodging/Transportation while Pt is in Rehab?: No  Goals Patient/Family Goal for Rehab: PT/OT/SLP Supervision  Expected length of stay: 7-10 days  Pt/Family Agrees to Admission and willing to participate: Yes Program Orientation  Provided & Reviewed with Pt/Caregiver Including Roles  & Responsibilities: Yes  Decrease burden of Care through IP rehab admission: NA  Possible need for SNF placement upon discharge: Not anticipated  Patient Condition: I have reviewed medical records from [facility], spoken with CSW, and patient and family member. I discussed via phone for inpatient rehabilitation assessment.  Patient will benefit from ongoing PT, OT, and SLP, can actively participate in 3 hours of therapy a day 5 days of the week, and can make measurable gains during the admission.  Patient will also benefit from the coordinated team approach during an Inpatient Acute Rehabilitation admission.  The patient will receive intensive therapy as well as Rehabilitation physician, nursing, social worker, and care management interventions.  Due to safety, disease management, medication administration, pain management, and patient education the patient requires 24 hour a day rehabilitation nursing.  The patient is currently min A  with mobility and basic ADLs.  Discharge setting and therapy post discharge at home with home health is anticipated.  Patient has agreed to participate in the Acute Inpatient Rehabilitation Program and will admit today.  Preadmission Screen Completed By:  Domingo Pulse, 03/05/2023 3:33 PM ______________________________________________________________________   Discussed status with Dr. Carlis Abbott  on 03/08/23 at 930 and received approval for admission today.  Admission Coordinator:  Domingo Pulse, CCC-SLP, time 1042/Date 03/08/23   Assessment/Plan: Diagnosis: Acute infarcts of bilateral centrum semiovale Does the need for close, 24 hr/day Medical supervision in concert with the patient's rehab needs make it unreasonable for this patient to be served in a less intensive setting? Yes Co-Morbidities requiring supervision/potential complications: overweight, DM, anxiety, depression,  GERD Due to bladder management, bowel management, safety, skin/wound care, disease management, medication administration, pain management, and patient education, does the patient require 24 hr/day rehab nursing? Yes Does the patient require coordinated care of a physician, rehab nurse, PT, OT, and SLP to address physical and functional deficits in the context of the above medical diagnosis(es)? Yes Addressing deficits in the following areas: balance, endurance, locomotion, strength, transferring, bowel/bladder control, bathing, dressing, feeding, grooming, toileting, cognition, and psychosocial support Can the patient actively participate in an intensive therapy program of at least 3 hrs of therapy 5 days a week? Yes The potential for patient to make measurable gains while on inpatient rehab is excellent Anticipated functional outcomes upon discharge from inpatient rehab: supervision PT, supervision OT, supervision SLP Estimated rehab length of  stay to reach the above functional goals is:  Anticipated discharge destination: Home 10. Overall Rehab/Functional Prognosis: excellent   MD Signature: Sula Soda, MD

## 2023-03-05 NOTE — Evaluation (Signed)
Speech Language Pathology Evaluation Patient Details Name: Erica [name] MRN: [id number] DOB: 1956-04-24 Today's Date: 03/05/2023 Time: 1235-1300 SLP Time Calculation (min) (ACUTE ONLY): 25 min  Problem List:  Patient Active Problem List   Diagnosis Date Noted   Acute ischemic stroke (HCC) 03/05/2023   Stroke-like symptom 03/04/2023   Research study patient 07/15/2022   Encounter for loop recorder check 01/02/2021   Loop Biotronik loop implant 12/16/2020 12/16/2020   Leukopenia    Controlled type 2 diabetes mellitus with hyperglycemia, with long-term current use of insulin (HCC)    Vascular headache    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   Sinus bradycardia on ECG 11/03/2020   Benign essential HTN    Pancytopenia (HCC)    Stage 3b chronic kidney disease (HCC)    Anemia    Labile blood glucose    Labile blood pressure    Diabetic peripheral neuropathy (HCC)    Essential hypertension    AKI (acute kidney injury) (HCC)    Ataxia, late effect of cerebrovascular disease    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   Acute CVA (cerebrovascular accident) (HCC) 06/26/2020   CVA (cerebral vascular accident) (HCC) 06/25/2020   BMI 32.0-32.9,adult 06/03/2020   Left upper lobe pneumonia 03/30/2020   Chest pain 10/01/2019   MDD (major depressive disorder), recurrent severe, 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   Suicide attempt (HCC) 05/09/2019   Cocaine abuse (HCC) 05/09/2019   Interstitial lung disease (HCC) 06/21/2016   COPD (chronic obstructive pulmonary disease) (HCC) 06/21/2016   Cough 06/21/2016   GERD (gastroesophageal reflux disease) 06/21/2016   Hypersomnia 06/21/2016   Tobacco user 06/21/2016   Rectal cancer (HCC) 01/30/2013   Past Medical History:  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   GERD (gastroesophageal  reflux disease)    Headache(784.0)    "mild"   Hypertension    Loop Biotronik loop implant 12/16/2020 12/16/2020   Mental disorder    depression   Neuropathy    feet    Pain    arthritis pain - takes tramadol as needed   Rectal polyp    very little bleeding with bowel movements- no pain   Stroke (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   Suicide attempt [facility])    Past Surgical History:  Past Surgical History:  Procedure Laterality Date   ANAL RECTAL MANOMETRY N/A 07/13/2016   Procedure: ANO RECTAL MANOMETRY;  Surgeon: Romie Levee, MD;  Location: [facility];  Service: Endoscopy;  Laterality: N/A;   CESAREAN SECTION     EUS N/A 11/21/2012   Procedure: LOWER ENDOSCOPIC ULTRASOUND (EUS);  Surgeon: Willis Modena, MD;  Location: [facility];  Service: Endoscopy;  Laterality: N/A;   FLEXIBLE SIGMOIDOSCOPY N/A 11/21/2012   Procedure: FLEXIBLE SIGMOIDOSCOPY;  Surgeon: Willis Modena, MD;  [facility]: [facility];  Service: Endoscopy;  Laterality: N/A;   FLEXIBLE SIGMOIDOSCOPY N/A 01/28/2013   Procedure: FLEXIBLE SIGMOIDOSCOPY;  Surgeon: Romie Levee, MD;  [facility]: [facility];  Service: Endoscopy;  Laterality: N/A;   LAPAROSCOPIC LOW ANTERIOR RESECTION N/A 01/29/2013   Procedure: LAPAROSCOPIC LOW ANTERIOR RESECTION, Rigid Proctoscopy;  Surgeon: Romie Levee, MD;  Location: [facility];  Service: General;  Laterality: N/A;   LAPAROSCOPIC SIGMOID COLECTOMY N/A 11/14/2012   Procedure: DIAGNOSTIC LAPAROSCOPY AND SIGMOIDMOIDOSCOPY ;  Surgeon: Emelia Loron, MD;  Location: [facility];  Service: General;  Laterality:  N/A;   RECTAL ULTRASOUND N/A 07/13/2016   Procedure: RECTAL ULTRASOUND;  Surgeon: Romie Levee, MD;  [facility]: [facility];  Service: Endoscopy;  Laterality: N/A;   TONSILLECTOMY     TONSILLECTOMY AND ADENOIDECTOMY     HPI:  Pt is a 67 y/o female admitted secondary to AMS, R facial droop and communication difficulties. MRI of the brain revealed small acute/subacute infarcts of the bilateral centrum semiovale  without hemorrhage or mass effect. PMHx including, but not limited, to CVA, DM, anxiety, depression, GERD.   Assessment / Plan / Recommendation Clinical Impression  Pt seen for speech/language evaluation. Full cognitive assessment due to pt's report and SLP observed increased lethargy. Pt A&Ox4. Pt demonstrated intact basic functional primary language skills. Impaired divergent naming noted. Speech is c/b hoarse vocal quality, reduced vocal loudness with tendency to speak on residual air, and articulatory imprecision. Per pt and sister, pt with changes to speech/language this admission. Pt with PMHx of stroke in 2022 in which pt recieved ST services. Pt currently lives with sister and has assistance with IADLs. Recommend continued SLP services acute/post-acute for above mentioned deficits and for further assessment of functional cognitive-linguistic ability.    SLP Assessment  SLP Recommendation/Assessment: Patient needs continued Speech Lanaguage Pathology Services SLP Visit Diagnosis: Dysarthria and anarthria (R47.1);Cognitive communication deficit (R41.841)    Recommendations for follow up therapy are one component of a multi-disciplinary discharge planning process, led by the attending physician.  Recommendations may be updated based on patient status, additional functional criteria and insurance authorization.    Follow Up Recommendations  Acute inpatient rehab (3hours/day)    Assistance Recommended at Discharge  Frequent or constant Supervision/Assistance  Functional Status Assessment Patient has had a recent decline in their functional status and demonstrates the ability to make significant improvements in function in a reasonable and predictable amount of time.  Frequency and Duration min 2x/week  2 weeks      SLP Evaluation Cognition  Overall Cognitive Status: Impaired/Different from baseline Arousal/Alertness: Awake/alert Orientation Level: Oriented X4 Comments: deferred due to  increased lethargy       Comprehension  Auditory Comprehension Overall Auditory Comprehension: Appears within functional limits for tasks assessed Yes/No Questions: Within Functional Limits Commands: Within Functional Limits Conversation: Simple Other Conversation Comments: extra time EffectiveTechniques: Extra processing time Visual Recognition/Discrimination Discrimination: Not tested Reading Comprehension Reading Status: Not tested    Expression Expression Primary Mode of Expression: Verbal Verbal Expression Overall Verbal Expression: Impaired Initiation: No impairment Automatic Speech: Name;Social Response Level of Generative/Spontaneous Verbalization: Word;Phrase;Sentence Repetition: No impairment Naming: Impairment Confrontation: Within functional limits Divergent: Other (comment) (10 animals in 60s; perseveration x1) Other Naming Comments: extra time Written Expression Dominant Hand: Right Written Expression: Not tested   Oral / Motor  Oral Motor/Sensory Function Overall Oral Motor/Sensory Function: Mild impairment Facial ROM: Reduced right Facial Symmetry: Abnormal symmetry right Motor Speech Overall Motor Speech: Impaired Respiration: Impaired Level of Impairment:  (all levels; speaking on residual volume) Phonation: Low vocal intensity Resonance: Within functional limits Articulation: Impaired Intelligibility: Intelligibility reduced Word: 75-100% accurate Phrase: 75-100% accurate Sentence: 75-100% accurate Conversation: 75-100% accurate Motor Planning: Witnin functional limits Motor Speech Errors: Not applicable Effective Techniques: Slow rate;Increased vocal intensity           Clyde Canterbury, M.S., CCC-SLP Speech-Language Pathologist [place] [phone] Arnette Felts)  Woodroe Chen 03/05/2023, 1:33 PM
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
## 2023-03-05 NOTE — Progress Notes (Signed)
Neurology Progress Note  Subjective: - No new complaints, curious about rehab options  Exam: Vitals:   03/05/23 0754 03/05/23 1220  BP: 135/78 (!) 140/81  Pulse: 65 64  Resp: 18 18  Temp: 98.1 F (36.7 C) (!) 97.5 F (36.4 C)  SpO2: 100% 100%   Gen: In bed, comfortable  Resp: non-labored breathing, no grossly audible wheezing Cardiac: Perfusing extremities well  Abd: soft, nt  Neuro: MS: Awake, alert, fully oriented, less speech hesitancy today  CN: EOMI, slight right facial droop, tongue midline  Motor: 4+ right deltoid, 4- left deltoid, 4+ bilateral hip flexion (slightly improved from yesterday)  Pertinent Data:  Carotid US 1. Less than 50% stenosis of the internal carotid arteries. 2. Minimal calcified plaque of the left ICA origin.  MRA personally reviewed, agree with radiology:   Normal MRA circle-of-Willis without significant proximal stenosis, aneurysm, or branch vessel occlusion.   HIV - neg RPR - neg B12 - 737 Thiamine - pending TSH - 0.916 ANCA - pending Ammonia - 27  ESR - mildly up at 33 (reference range 0-30) CRP - 0.6 (normal)    Basic Metabolic Panel: Recent Labs  Lab 03/04/23 0930  NA 138  K 3.8  CL 111  CO2 20*  GLUCOSE 93  BUN 33*  CREATININE 1.55*  CALCIUM 8.6*    CBC: Recent Labs  Lab 03/04/23 0930  WBC 5.5  HGB 11.5*  HCT 31.9*  MCV 88.9  PLT 163    Coagulation Studies: No results for input(s): "LABPROT", "INR" in the last 72 hours.    Assessment: This is a 67 year old woman with a past medical history as above presenting with small bilateral acute/subacute strokes most suggestive of concurrent small vessel disease.  Negative RPR in an immunocompetent patient makes neurosyphilis unlikely, and with only very mild elevation of ESR and normal CRP I do not have strong concerns for an infectious/inflammatory process playing a role here.   Impression:  Bilateral small vessel disease appearing strokes, less likely  cardioembolic Progressive cognitive impairment likely multifactorial with potential components for:              -Vascular dementia             -Possible cerebral amyloid angiopathy (punctate chronic microhemorrhages predominantly in the bilateral occipital lobes)             -Polypharmacy             -Possible contribution of poor sleep             -Possible pseudodementia related to anxiety/depression and social stressors History of idiopathic pulmonary fibrosis History of significant anxiety/depression/PTSD type symptoms    Recommendations: -Dual antiplatelet therapy for 21 days followed by aspirin monotherapy, unless loop recorder demonstrates atrial fibrillation in which case anticoagulation should be utilized instead of antiplatelet agents -Increase rosuvastatin to 40 mg nightly -Outpatient repeat sleep study given report of snoring (last sleep study 2018)  -Consider outpatient repeat neurocognitive testing -Inpatient neurology will sign off at this time, please reach out if any additional questions or concerns arise -Discussed with primary via secure chat  Brooke Dare MD-PhD [facility] [phone]  Available 7 AM to 7 PM, outside these hours please contact Neurologist on call listed on AMION

## 2023-03-05 NOTE — Progress Notes (Signed)
Progress Note   Patient: Erica [name] WJX:[id number] DOB: 18-Mar-1956 DOA: 03/04/2023     0 DOS: the patient was seen and examined on 03/05/2023   Brief hospital course: Ms. Vasiliki Medellin is a 67 year old female with history of CVA with left-sided residual deficits, insulin-dependent diabetes mellitus, depression, anxiety, hyperlipidemia, GERD, who presents emergency department for chief concerns of altered mental status.  Vitals in the ED showed temperature of 97.9, respiration rate of 20, heart rate of 58, blood pressure 160/92, SpO2 of 100% on room air.  sNa is 138, potassium 3.8, chloride 111, bicarb 20, BUN 33, serum creatinine 1.55, EGFR of 36, nonfasting blood glucose 93, WBC 5.5, hemoglobin 11.5, platelets of 163.  CK is 58.  Troponin is 12.  UA was positive for trace leukocytes. COVID PCR was negative.  ED treatment: 1 L bolus.  Aspirin 324 mg p.o. one-time dose.  Complete echo ordered.  MRI brain reviewed showed Small acute to subacute infarcts in the bilateral centrum semiovale without hemorrhage or mass effect. Multiple remote infarcts in the supratentorial and infratentorial brain as above.  Assessment and Plan: * Acute ischemic stroke Altered mental status with initial right-sided mouth droop. MRI of the brain showed small acute to sub acute stroke Complete echo pending, continue telemetry. Dr. Flora Lipps will arrange for loop recorder interrogation. Neurology service consulted. Follow further recommendations. Fasting LDL 86 and A1c 6.9. Continue aspirin, statin. Frequent neuro vascular checks Heart healthy/ carb modified PT, OT, SLP Fall precaution and aspiration precaution  Controlled type 2 diabetes mellitus with hyperglycemia, with long-term current use of insulin (HCC) She is on Lantus 20 units in the morning and 18 units at night. Will give 10 units tonight and monitor her blood sugars. Continue accucheks, sliding scale. Hypoglycemia protocol.  Benign essential  HTN BP high yesterday. Allow permissive hypertension. Will resume her home antihypertensives tomorrow.  Prior h/o CVA (cerebral vascular accident) (HCC) Continue aspirin 81 mg daily, rosuvastatin 20 mg nightly.  MDD (major depressive disorder), recurrent severe, without psychosis (HCC) Continue sertraline 50 mg daily, venlafaxine 150 mg daily, trazodone 100 mg nightly were resumed on admission  COPD (chronic obstructive pulmonary disease) (HCC) No acute exacerbation Resumed albuterol inhalation every 6 hours as needed for wheezing and shortness of breath  Interstitial lung disease (HCC) Home pirfenidone 801 mg p.o. 3 times daily with meals resumed on admission.        Subjective: Patient is seen and examined today morning.  She is lying comfortably, feels dizzy upon getting out of bed.  Able to answer my questions appropriately.  No slurred speech or facial weakness.  Sister is at bedside states that her vision much improved  Physical Exam: Vitals:   03/04/23 1954 03/04/23 2342 03/05/23 0517 03/05/23 0754  BP: (!) 158/66 134/70 (!) 145/74 135/78  Pulse: 74 69 62 65  Resp: 18 20 18 18   Temp: 98.5 F (36.9 C) 98.4 F (36.9 C) 97.9 F (36.6 C) 98.1 F (36.7 C)  TempSrc:   Oral   SpO2: 100% 100% 100% 100%  Weight:      Height:       General - Elderly African-American female, no apparent distress HEENT - PERRLA, EOMI, atraumatic head, non tender sinuses. Lung - Clear, rales, rhonchi, wheezes. Heart - S1, S2 heard, no murmurs, rubs, no pedal edema Neuro - Alert, awake and oriented x 3, left sided weakness not new Skin - Warm and dry. Data Reviewed:     Latest Ref Rng & Units 03/04/2023  9:30 AM 01/06/2023    4:42 PM 01/08/2022    3:00 PM  CBC  WBC 4.0 - 10.5 K/uL 5.5  3.9  3.9   Hemoglobin 12.0 - 15.0 g/dL 16.1  09.6  04.5   Hematocrit 36.0 - 46.0 % 31.9  31.7  31.7   Platelets 150 - 400 K/uL 163  145  185       Latest Ref Rng & Units 03/04/2023    9:30 AM  01/06/2023    4:42 PM 01/12/2022   12:18 PM  BMP  Glucose 70 - 99 mg/dL 93  409  811   BUN 8 - 23 mg/dL 33  16  32   Creatinine 0.44 - 1.00 mg/dL 9.14  7.82  9.56   BUN/Creat Ratio 6 - 22 (calc)   17   Sodium 135 - 145 mmol/L 138  140  140   Potassium 3.5 - 5.1 mmol/L 3.8  3.9  4.7   Chloride 98 - 111 mmol/L 111  112  110   CO2 22 - 32 mmol/L 20  21  23    Calcium 8.9 - 10.3 mg/dL 8.6  8.6  9.3    US Carotid Bilateral  Result Date: 03/05/2023 CLINICAL DATA:  Stroke Hypertension Hyperlipidemia Diabetes History of tobacco use EXAM: BILATERAL CAROTID DUPLEX ULTRASOUND TECHNIQUE: Wallace Cullens scale imaging, color Doppler and duplex ultrasound were performed of bilateral carotid and vertebral arteries in the neck. COMPARISON:  None available FINDINGS: Criteria: Quantification of carotid stenosis is based on velocity parameters that correlate the residual internal carotid diameter with NASCET-based stenosis levels, using the diameter of the distal internal carotid lumen as the denominator for stenosis measurement. The following velocity measurements were obtained: RIGHT ICA: 125/46 cm/sec CCA: 42/12 cm/sec SYSTOLIC ICA/CCA RATIO:  3.0 ECA: 24 cm/sec LEFT ICA: 127/54 cm/sec CCA: 42/13 cm/sec SYSTOLIC ICA/CCA RATIO:  3.0 ECA: 36 cm/sec RIGHT CAROTID ARTERY: Mildly elevated peak systolic and end-diastolic velocities of the right ICA are likely artifact given lack of significant atheromatous plaque. RIGHT VERTEBRAL ARTERY:  Antegrade flow. LEFT CAROTID ARTERY: Mildly elevated peak systolic and end-diastolic glottis disease of the left ICA are likely artifact given only minimal atheromatous plaque at the origin. LEFT VERTEBRAL ARTERY:  Antegrade flow. IMPRESSION: 1. Less than 50% stenosis of the internal carotid arteries. 2. Minimal calcified plaque of the left ICA origin. Electronically Signed   By: Acquanetta Belling M.D.   On: 03/05/2023 11:32   MR BRAIN WO CONTRAST  Result Date: 03/04/2023 CLINICAL DATA:  Altered mental  status, history of CVA with left-sided weakness EXAM: MRI HEAD WITHOUT CONTRAST TECHNIQUE: Multiplanar, multiecho pulse sequences of the brain and surrounding structures were obtained without intravenous contrast. COMPARISON:  CT head obtained earlier the same day, brain MRI 11/23/2021 FINDINGS: Brain: There are small foci of faintly elevated DWI signal in the bilateral centrum semiovale (5-64) with faintly decreased ADC signal consistent with small acute to subacute infarcts. There is no hemorrhage or mass effect There is no other evidence of acute infarct. There is no acute intracranial hemorrhage or extra-axial fluid collection. Background parenchymal volume is normal. Multiple remote cortical infarcts in involving the bilateral cerebral cortex, deep gray nuclei, and cerebellar hemispheres are again seen. The ventricles are stable in size. There are punctate chronic microhemorrhages in the right paracentral lobule and right cerebellar hemisphere as well as a small amount of chronic blood products involving remote bilateral occipital infarcts. The pituitary and suprasellar region are normal. There is no mass lesion there  is no mass effect or midline shift. Vascular: Normal flow voids. Skull and upper cervical spine: Normal marrow signal. Sinuses/Orbits: The paranasal sinuses are clear. The globes and orbits are unremarkable. Other: The mastoid air cells and middle ear cavities are clear. IMPRESSION: 1. Small acute to subacute infarcts in the bilateral centrum semiovale without hemorrhage or mass effect. 2. Multiple remote infarcts in the supratentorial and infratentorial brain as above. Electronically Signed   By: Lesia Hausen M.D.   On: 03/04/2023 16:58   DG Chest Port 1 View  Result Date: 03/04/2023 CLINICAL DATA:  [id number] Altered mental status [id number] EXAM: PORTABLE CHEST 1 VIEW COMPARISON:  January 06, 2023 FINDINGS: The cardiomediastinal silhouette is unchanged in contour.Similar appearance of coarse reticular  markings of bilateral lung bases most consistent with underlying pulmonary fibrosis. No pleural effusion. No pneumothorax. No acute pleuroparenchymal abnormality. IMPRESSION: Similar appearance of pulmonary fibrosis. Electronically Signed   By: Meda Klinefelter M.D.   On: 03/04/2023 13:19   CT Head Wo Contrast  Result Date: 03/04/2023 CLINICAL DATA:  Mental status change with unknown cause EXAM: CT HEAD WITHOUT CONTRAST TECHNIQUE: Contiguous axial images were obtained from the base of the skull through the vertex without intravenous contrast. RADIATION DOSE REDUCTION: This exam was performed according to the departmental dose-optimization program which includes automated exposure control, adjustment of the mA and/or kV according to patient size and/or use of iterative reconstruction technique. COMPARISON:  11/22/2021 FINDINGS: Brain: Multiple chronic infarcts along the bilateral cerebral cortex, at the deep gray nuclei, and in the bilateral cerebellum. No convincing progression when compared to prior CT and MR imaging. No evidence of acute infarct, hemorrhage, hydrocephalus, mass, or collection. Vascular: No hyperdense vessel or unexpected calcification. Skull: Normal. Negative for fracture or focal lesion. Sinuses/Orbits: No acute finding. IMPRESSION: 1. No acute finding. 2. Multiple small chronic infarcts scattered throughout the brain. Electronically Signed   By: Tiburcio Pea M.D.   On: 03/04/2023 09:59     Family Communication: Sister at bedside updated regarding her care. All questions answered.  Disposition: Status is: Inpatient Remains inpatient appropriate because: new stroke, neuro work up  Planned Discharge Destination: Home with Home Health    Time spent: 43 minutes  Author: Marcelino Duster, MD 03/05/2023 12:08 PM  For on call review [email or web address].

## 2023-03-05 NOTE — Progress Notes (Signed)
Inpatient Rehab Admissions:  Inpatient Rehab Consult received.  I spoke with patient and pt's sister [name] on the telephone for rehabilitation assessment and to discuss goals and expectations of an inpatient rehab admission.  Discussed average length of stay, insurance authorization requirement and discharge home after completion of CIR. Both acknowledged understanding. Pt interested in pursuing CIR and sister supportive. [name] confirmed that she will be able to provide support for pt after discharge. Will continue to follow.  Signed: Wolfgang Phoenix, MS, CCC-SLP Admissions Coordinator [phone]

## 2023-03-05 NOTE — Evaluation (Signed)
Occupational Therapy Evaluation Patient Details Name: Erica [name] MRN: [id number] DOB: 01-Dec-1955 Today's Date: 03/05/2023   History of Present Illness Pt is a 67 y/o female admitted secondary to AMS, R facial droop and communication difficulties. MRI of the brain revealed small acute/subacute infarcts of the bilateral centrum semiovale without hemorrhage or mass effect. PMH including but not limited to CVA, DM, anxiety, depression, GERD.   Clinical Impression   Patient presenting with decreased Ind in self care,balance, functional mobility/transfers, endurance, and safety awareness. Patient reports living with sister and using [facility] for mobility and being Ind with ADLs. Pt performs bed mobility with increased time and stands without use of AD with mod A from standard bed height. Mod A HHA to take steps to sink and stands with min A for balance while pt washes face and hands. Increased time to process and sequence while standing and needing assistance to squeeze extra water from cloth. Pt returns to bed at end of session secondary to fatigue. Call bell and all needed items within reach upon exiting the room.  Patient will benefit from acute OT to increase overall independence in the areas of ADLs, functional mobility, and safety awareness in order to safely discharge.      If plan is discharge home, recommend the following: A lot of help with walking and/or transfers;A little help with bathing/dressing/bathroom;Assistance with cooking/housework;Assist for transportation;Help with stairs or ramp for entrance    Functional Status Assessment  Patient has had a recent decline in their functional status and demonstrates the ability to make significant improvements in function in a reasonable and predictable amount of time.  Equipment Recommendations  Other (comment) (defer to next venue of care)       Precautions / Restrictions Precautions Precautions: Fall Restrictions Weight Bearing  Restrictions: No      Mobility Bed Mobility Overal bed mobility: Needs Assistance Bed Mobility: Supine to Sit, Sit to Supine     Supine to sit: Supervision Sit to supine: Supervision   General bed mobility comments: increased time and effort to complete    Transfers Overall transfer level: Needs assistance Equipment used: 1 person hand held assist Transfers: Sit to/from Stand Sit to Stand: Mod assist                  Balance Overall balance assessment: Needs assistance Sitting-balance support: Feet supported Sitting balance-Leahy Scale: Fair     Standing balance support: Bilateral upper extremity supported, Reliant on assistive device for balance, During functional activity Standing balance-Leahy Scale: Poor                             ADL either performed or assessed with clinical judgement   ADL Overall ADL's : Needs assistance/impaired     Grooming: Wash/dry hands;Wash/dry face;Standing;Minimal Biomedical engineer: Moderate assistance;Ambulation;Rolling walker (2 wheels)                   Vision Patient Visual Report: No change from baseline              Pertinent Vitals/Pain Pain Assessment Pain Assessment: No/denies pain     Extremity/Trunk Assessment Upper Extremity Assessment Upper Extremity Assessment: Generalized weakness;LUE deficits/detail LUE Deficits / Details: decreased coordination and dexterity but also had prior CVA with residual deficits on L side   Lower Extremity Assessment Lower Extremity  Assessment: Generalized weakness;Difficult to assess due to impaired cognition       Communication Communication Communication: Difficulty communicating thoughts/reduced clarity of speech Cueing Techniques: Verbal cues   Cognition Arousal: Alert Behavior During Therapy: Flat affect Overall Cognitive Status: Impaired/Different from baseline Area of Impairment: Memory, Problem solving                      Memory: Decreased short-term memory       Problem Solving: Slow processing General Comments: Pleasant and cooperative                Home Living Family/patient expects to be discharged to:: Private residence Living Arrangements: Other relatives;Other (Comment) (older sister) Available Help at Discharge: Family;Available 24 hours/day Type of Home: House Home Access: Level entry     Home Layout: Other (Comment) (step down into kitchen)     Bathroom Shower/Tub: Chief Strategy Officer: Handicapped height Bathroom Accessibility: Yes   Home Equipment: Cane - single point;Shower seat      Lives With: Other (Comment);Family    Prior Functioning/Environment Prior Level of Function : Independent/Modified Independent             Mobility Comments: ambulates with use of a cane ADLs Comments: Ind with self care tasks and sister assist with IADLs as needed        OT Problem List: Decreased strength;Decreased activity tolerance;Decreased safety awareness;Impaired balance (sitting and/or standing);Decreased knowledge of use of DME or AE;Decreased coordination      OT Treatment/Interventions: Self-care/ADL training;Therapeutic exercise;Therapeutic activities;Energy conservation;DME and/or AE instruction;Patient/family education;Balance training    OT Goals(Current goals can be found in the care plan section) Acute Rehab OT Goals Patient Stated Goal: to return to PLOF OT Goal Formulation: With patient Time For Goal Achievement: 03/19/23 Potential to Achieve Goals: Fair ADL Goals Pt Will Perform Grooming: with supervision;standing Pt Will Perform Lower Body Dressing: with supervision;sit to/from stand Pt Will Transfer to Toilet: with supervision;ambulating Pt Will Perform Toileting - Clothing Manipulation and hygiene: with supervision;sit to/from stand  OT Frequency: Min 1X/week       AM-PAC OT "6 Clicks" Daily Activity      Outcome Measure Help from another person eating meals?: None Help from another person taking care of personal grooming?: A Little Help from another person toileting, which includes using toliet, bedpan, or urinal?: A Lot Help from another person bathing (including washing, rinsing, drying)?: A Lot Help from another person to put on and taking off regular upper body clothing?: A Little Help from another person to put on and taking off regular lower body clothing?: A Lot 6 Click Score: 16   End of Session Equipment Utilized During Treatment: Rolling walker (2 wheels) Nurse Communication: Mobility status  Activity Tolerance: Patient tolerated treatment well;Patient limited by fatigue Patient left: in bed;with call bell/phone within reach;with bed alarm set  OT Visit Diagnosis: Unsteadiness on feet (R26.81);Repeated falls (R29.6);Muscle weakness (generalized) (M62.81);Other (comment) (coordination deficits)                Time: 1610-9604 OT Time Calculation (min): 17 min Charges:  OT General Charges $OT Visit: 1 Visit OT Evaluation $OT Eval Low Complexity: 1 Low OT Treatments $Self Care/Home Management : 8-22 mins  Jackquline Denmark, MS, OTR/L , CBIS ascom [phone]  03/05/23, 2:59 PM

## 2023-03-06 DIAGNOSIS — R4189 Other symptoms and signs involving cognitive functions and awareness: Secondary | ICD-10-CM | POA: Diagnosis not present

## 2023-03-06 DIAGNOSIS — I10 Essential (primary) hypertension: Secondary | ICD-10-CM | POA: Diagnosis not present

## 2023-03-06 DIAGNOSIS — E1165 Type 2 diabetes mellitus with hyperglycemia: Secondary | ICD-10-CM | POA: Diagnosis not present

## 2023-03-06 DIAGNOSIS — F332 Major depressive disorder, recurrent severe without psychotic features: Secondary | ICD-10-CM | POA: Diagnosis not present

## 2023-03-06 DIAGNOSIS — I639 Cerebral infarction, unspecified: Secondary | ICD-10-CM | POA: Diagnosis not present

## 2023-03-06 LAB — VITAMIN B12: Vitamin B-12: 737 pg/mL (ref 180–914)

## 2023-03-06 LAB — RPR: RPR Ser Ql: NONREACTIVE

## 2023-03-06 LAB — BASIC METABOLIC PANEL
Anion gap: 5 (ref 5–15)
BUN: 22 mg/dL (ref 8–23)
CO2: 19 mmol/L — ABNORMAL LOW (ref 22–32)
Calcium: 8.1 mg/dL — ABNORMAL LOW (ref 8.9–10.3)
Chloride: 115 mmol/L — ABNORMAL HIGH (ref 98–111)
Creatinine, Ser: 1.47 mg/dL — ABNORMAL HIGH (ref 0.44–1.00)
GFR, Estimated: 39 mL/min — ABNORMAL LOW (ref >60)
Glucose, Bld: 171 mg/dL — ABNORMAL HIGH (ref 70–99)
Potassium: 3.7 mmol/L (ref 3.5–5.1)
Sodium: 139 mmol/L (ref 135–145)

## 2023-03-06 LAB — CBC
HCT: 27.5 % — ABNORMAL LOW (ref 36.0–46.0)
Hemoglobin: 10 g/dL — ABNORMAL LOW (ref 12.0–15.0)
MCH: 32.2 pg (ref 26.0–34.0)
MCHC: 36.4 g/dL — ABNORMAL HIGH (ref 30.0–36.0)
MCV: 88.4 fL (ref 80.0–100.0)
Platelets: 146 10*3/uL — ABNORMAL LOW (ref 150–400)
RBC: 3.11 MIL/uL — ABNORMAL LOW (ref 3.87–5.11)
RDW: 15.5 % (ref 11.5–15.5)
WBC: 3.8 10*3/uL — ABNORMAL LOW (ref 4.0–10.5)
nRBC: 0 % (ref 0.0–0.2)

## 2023-03-06 LAB — GLUCOSE, CAPILLARY
Glucose-Capillary: 131 mg/dL — ABNORMAL HIGH (ref 70–99)
Glucose-Capillary: 123 mg/dL — ABNORMAL HIGH (ref 70–99)
Glucose-Capillary: 205 mg/dL — ABNORMAL HIGH (ref 70–99)
Glucose-Capillary: 191 mg/dL — ABNORMAL HIGH (ref 70–99)

## 2023-03-06 LAB — HIV ANTIBODY (ROUTINE TESTING W REFLEX): HIV Screen 4th Generation wRfx: NONREACTIVE

## 2023-03-06 LAB — SEDIMENTATION RATE: Sed Rate: 33 mm/hr — ABNORMAL HIGH (ref 0–30)

## 2023-03-06 LAB — C-REACTIVE PROTEIN: CRP: 0.6 mg/dL (ref <1.0)

## 2023-03-06 LAB — AMMONIA: Ammonia: 27 umol/L (ref 9–35)

## 2023-03-06 MED ORDER — CLOPIDOGREL BISULFATE 75 MG PO TABS
75.0000 mg | ORAL_TABLET | Freq: Every day | ORAL | Status: DC
  Administered 2023-03-07: 75 mg via ORAL
  Filled 2023-03-06: qty 1

## 2023-03-06 MED ORDER — INSULIN GLARGINE-YFGN 100 UNIT/ML ~~LOC~~ SOLN
20.0000 [IU] | Freq: Every day | SUBCUTANEOUS | Status: DC
  Administered 2023-03-06 – 2023-03-08 (×3): 20 [IU] via SUBCUTANEOUS
  Filled 2023-03-06 (×3): qty 0.2

## 2023-03-06 MED ORDER — CLOPIDOGREL BISULFATE 75 MG PO TABS
300.0000 mg | ORAL_TABLET | Freq: Once | ORAL | Status: AC
  Administered 2023-03-06: 300 mg via ORAL
  Filled 2023-03-06: qty 4

## 2023-03-06 MED ORDER — ROSUVASTATIN CALCIUM 20 MG PO TABS
40.0000 mg | ORAL_TABLET | Freq: Every day | ORAL | Status: DC
  Administered 2023-03-06 – 2023-03-07 (×2): 40 mg via ORAL
  Filled 2023-03-06 (×2): qty 2

## 2023-03-06 MED ORDER — THIAMINE MONONITRATE 100 MG PO TABS
100.0000 mg | ORAL_TABLET | Freq: Every day | ORAL | Status: DC
  Administered 2023-03-06 – 2023-03-08 (×3): 100 mg via ORAL
  Filled 2023-03-06 (×3): qty 1

## 2023-03-06 NOTE — TOC Initial Note (Signed)
Transition of Care [facility]) - Initial/Assessment Note    Patient Details  Name: Erica [name] MRN: [id number] Date of Birth: 25-Jan-1956  Transition of Care [facility]) CM/SW Contact:    Allena Katz, LCSW Phone Number: 03/06/2023, 3:16 PM  Clinical Narrative:    Pt admitted from home. CIR has started Auth.              Expected Discharge Plan:  (CIR) Barriers to Discharge: Continued Medical Work up   Patient Goals and CMS Choice Patient states their goals for this hospitalization and ongoing recovery are:: pt to go to CIR CMS Medicare.gov Compare Post Acute Care list provided to:: Patient        Expected Discharge Plan and Services       Living arrangements for the past 2 months: Single Family Home                                      Prior Living Arrangements/Services Living arrangements for the past 2 months: Single Family Home Lives with:: Siblings Patient language and need for interpreter reviewed:: Yes          Care giver support system in place?: Yes (comment) Current home services: DME (cane)    Activities of Daily Living Home Assistive Devices/Equipment: Cane (specify quad or straight) ADL Screening (condition at time of admission) Patient's cognitive ability adequate to safely complete daily activities?: Yes Is the patient deaf or have difficulty hearing?: No Does the patient have difficulty seeing, even when wearing glasses/contacts?: Yes (patient states she lost her glasses) Does the patient have difficulty concentrating, remembering, or making decisions?: No Patient able to express need for assistance with ADLs?: Yes Does the patient have difficulty dressing or bathing?: No Independently performs ADLs?: Yes (appropriate for developmental age) Does the patient have difficulty walking or climbing stairs?: No Weakness of Legs: Left Weakness of Arms/Hands: Left  Permission Sought/Granted                  Emotional Assessment        Orientation: : Oriented to Self, Oriented to Place, Oriented to Situation, Oriented to  Time      Admission diagnosis:  Shortness of breath [R06.02] Dizzy [R42] Nonrheumatic aortic valve insufficiency [I35.1] Stroke-like symptom [R29.90] Secondary hypertension [I15.9] Altered mental status, unspecified altered mental status type [R41.82] Acute ischemic stroke [facility]) [I63.9] Patient Active Problem List   Diagnosis Date Noted   Acute ischemic stroke (HCC) 03/05/2023   Stroke-like symptom 03/04/2023   Research study patient 07/15/2022   Encounter for loop recorder check 01/02/2021   Loop Biotronik loop implant 12/16/2020 12/16/2020   Leukopenia    Controlled type 2 diabetes mellitus with hyperglycemia, with long-term current use of insulin (HCC)    Vascular headache    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   Sinus bradycardia on ECG 11/03/2020   Benign essential HTN    Pancytopenia (HCC)    Stage 3b chronic kidney disease (HCC)    Anemia    Labile blood glucose    Labile blood pressure    Diabetic peripheral neuropathy (HCC)    Essential hypertension    AKI (acute kidney injury) (HCC)    Ataxia, late effect of cerebrovascular disease    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   Acute CVA (cerebrovascular accident) (HCC) 06/26/2020   CVA (cerebral vascular accident) (HCC) 06/25/2020  BMI 32.0-32.9,adult 06/03/2020   Left upper lobe pneumonia 03/30/2020   Chest pain 10/01/2019   MDD (major depressive disorder), recurrent severe, 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   Suicide attempt (HCC) 05/09/2019   Cocaine abuse (HCC) 05/09/2019   Interstitial lung disease (HCC) 06/21/2016   COPD (chronic obstructive pulmonary disease) (HCC) 06/21/2016   Cough 06/21/2016   GERD (gastroesophageal reflux disease) 06/21/2016   Hypersomnia 06/21/2016   Tobacco user 06/21/2016   Rectal cancer (HCC) 01/30/2013   PCP:  Fleet Contras, MD Pharmacy:    [facility] [phone] Ginette Otto, [place] - [address] AT [facility] [address] [facility] [address] [place] [address] Phone: [phone] Fax: [phone]  [facility] - [place], [place] - [address] [place] [address] Phone: [phone] Fax: [phone]  [id number] - [place], [place] - [address] AT [facility] [address] [place] [address] Phone: [phone] Fax: [phone]     Social Determinants of Health (SDOH) Social History: SDOH Screenings   Food Insecurity: No Food Insecurity (03/04/2023)  Recent Concern: Food Insecurity - Food Insecurity Present (02/01/2023)  Housing: Low Risk  (03/04/2023)  Recent Concern: Housing - High Risk (02/01/2023)  Transportation Needs: No Transportation Needs (03/04/2023)  Recent Concern: Transportation Needs - Unmet Transportation Needs (02/01/2023)  Utilities: Not At Risk (03/04/2023)  Recent Concern: Utilities - At Risk (02/01/2023)  Depression (PHQ2-9): Low Risk  (04/08/2022)  Social Connections: Unknown (07/25/2022)   Received from [facility]  Tobacco Use: Medium Risk (03/04/2023)   SDOH Interventions:     Readmission Risk Interventions     No data to display

## 2023-03-06 NOTE — Progress Notes (Signed)
   03/06/23 1000  Spiritual Encounters  Type of Visit Initial  Care provided to: Patient  Referral source Chaplain assessment  Reason for visit Routine spiritual support  OnCall Visit No  Spiritual Framework  Patient Stress Factors Not reviewed  Interventions  Spiritual Care Interventions Made Established relationship of care and support  Intervention Outcomes  Outcomes Connection to spiritual care  Spiritual Care Plan  Spiritual Care Issues Still Outstanding No further spiritual care needs at this time (see row info)   Want to visit patient so they know Pastoral Care is in the hospital. Patient said they were okay so, I told her if she needs to talk or need some company Chaplain are here to make her day a little better.

## 2023-03-06 NOTE — Progress Notes (Signed)
Inpatient Rehab Admissions Coordinator:  ?Insurance authorization started. Will continue to follow. ? ? ?Wolfgang Phoenix, MS, CCC-SLP ?Admissions Coordinator ?[phone] ? ?

## 2023-03-06 NOTE — Progress Notes (Signed)
Progress Note   Patient: Erica [name] WGN:[id number] DOB: 04-12-1956 DOA: 03/04/2023     1 DOS: the patient was seen and examined on 03/06/2023   Brief hospital course: [name] is a 67 year old female with history of CVA with left-sided residual deficits, insulin-dependent diabetes mellitus, depression, anxiety, hyperlipidemia, GERD, who presents emergency department for chief concerns of altered mental status.  MRI brain reviewed showed Small acute to subacute infarcts in the bilateral centrum semiovale without hemorrhage or mass effect. Multiple remote infarcts in the supratentorial and infratentorial brain as above.  Assessment and Plan: * Acute ischemic stroke Altered mental status with initial right-sided mouth droop. MRI of the brain showed small acute to sub acute stroke Complete echo pending, continue telemetry. EP to interrogate loop recorder. Neurology follow up appreciated - Bilateral small vessel disease appearing strokes, less likely cardioembolic, adviseddual antiplatelets for 21 days. PT/ OT recommended rehab. Crestor increased to 40. Heart healthy/ carb modified  Controlled type 2 diabetes mellitus with hyperglycemia, with long-term current use of insulin (HCC) Lantus 10 units tonight and monitor her blood sugars. Continue accucheks, sliding scale. Hypoglycemia protocol.  Benign essential HTN BP stable Resumed Norvasc home dose.  Prior h/o CVA (cerebral vascular accident) (HCC) Continue aspirin 81 mg daily, rosuvastatin 20 mg nightly.  MDD (major depressive disorder), recurrent severe, without psychosis (HCC) Continue sertraline 50 mg daily, venlafaxine 150 mg daily, trazodone 100 mg nightly were resumed on admission  COPD (chronic obstructive pulmonary disease) (HCC) No acute exacerbation Resumed albuterol inhalation every 6 hours as needed for wheezing and shortness of breath  Interstitial lung disease (HCC) Home pirfenidone 801 mg p.o. 3 times daily  with meals resumed on admission.  TOC working on Garment/textile technologist, Firefighter.      Subjective: Patient is seen and examined today morning.  She is lying comfortably, feels left leg more weak and her speech slurred.  Able to answer my questions appropriately.   Physical Exam: Vitals:   03/06/23 0026 03/06/23 0445 03/06/23 0749 03/06/23 1133  BP: 118/70 119/71 135/77 129/71  Pulse: 70 67 68 74  Resp: 18 18 15 19   Temp: 98.2 F (36.8 C) 98.5 F (36.9 C) 98.1 F (36.7 C) 98 F (36.7 C)  TempSrc: Oral Oral    SpO2: 100% 100% 100% 100%  Weight:      Height:       General - Elderly African-American female, no apparent distress HEENT - PERRLA, EOMI, atraumatic head, non tender sinuses. Lung - Clear, rales, rhonchi, wheezes. Heart - S1, S2 heard, no murmurs, rubs, no pedal edema Neuro - Alert, awake and oriented x 3, left sided weakness, speech difficulty. Skin - Warm and dry. Data Reviewed:     Latest Ref Rng & Units 03/06/2023    4:39 AM 03/04/2023    9:30 AM 01/06/2023    4:42 PM  CBC  WBC 4.0 - 10.5 K/uL 3.8  5.5  3.9   Hemoglobin 12.0 - 15.0 g/dL 78.4  69.6  29.5   Hematocrit 36.0 - 46.0 % 27.5  31.9  31.7   Platelets 150 - 400 K/uL 146  163  145       Latest Ref Rng & Units 03/06/2023    4:39 AM 03/04/2023    9:30 AM 01/06/2023    4:42 PM  BMP  Glucose 70 - 99 mg/dL 284  93  132   BUN 8 - 23 mg/dL 22  33  16   Creatinine 0.44 - 1.00  mg/dL 7.82  9.56  2.13   Sodium 135 - 145 mmol/L 139  138  140   Potassium 3.5 - 5.1 mmol/L 3.7  3.8  3.9   Chloride 98 - 111 mmol/L 115  111  112   CO2 22 - 32 mmol/L 19  20  21    Calcium 8.9 - 10.3 mg/dL 8.1  8.6  8.6    ECHOCARDIOGRAM COMPLETE  Result Date: 03/05/2023    ECHOCARDIOGRAM REPORT   Patient Name:   Erica [name] Date of Exam: 03/05/2023 Medical Rec #:  [id number]    Height:       63.0 in Accession #:    [id number]   Weight:       166.9 lb Date of Birth:  [birth year]    BSA:          1.790 m Patient Age:    67 years      BP:           145/74 mmHg Patient Gender: F            HR:           64 bpm. Exam Location:  [facility] Procedure: 2D Echo, Cardiac Doppler and Color Doppler Indications:     Stroke I63.9  History:         Patient has no prior history of Echocardiogram examinations.                  Stroke; Risk Factors:Hypertension.  Sonographer:     Neysa Bonito Roar Referring Phys:  [id number] AMY N COX Diagnosing Phys: Lennie Odor MD IMPRESSIONS  1. Left ventricular ejection fraction, by estimation, is 65 to 70%. The left ventricle has normal function. The left ventricle has no regional wall motion abnormalities. Left ventricular diastolic parameters are consistent with Grade I diastolic dysfunction (impaired relaxation).  2. Right ventricular systolic function is normal. The right ventricular size is normal. Tricuspid regurgitation signal is inadequate for assessing PA pressure.  3. The mitral valve is grossly normal. Trivial mitral valve regurgitation. No evidence of mitral stenosis.  4. The aortic valve is tricuspid. There is mild calcification of the aortic valve. There is mild thickening of the aortic valve. Aortic valve regurgitation is mild. Aortic valve sclerosis is present, with no evidence of aortic valve stenosis.  5. The inferior vena cava is normal in size with greater than 50% respiratory variability, suggesting right atrial pressure of 3 mmHg. Conclusion(s)/Recommendation(s): No intracardiac source of embolism detected on this transthoracic study. Consider a transesophageal echocardiogram to exclude cardiac source of embolism if clinically indicated. FINDINGS  Left Ventricle: Left ventricular ejection fraction, by estimation, is 65 to 70%. The left ventricle has normal function. The left ventricle has no regional wall motion abnormalities. The left ventricular internal cavity size was normal in size. There is  no left ventricular hypertrophy. Left ventricular diastolic parameters are consistent with Grade I diastolic  dysfunction (impaired relaxation). Right Ventricle: The right ventricular size is normal. No increase in right ventricular wall thickness. Right ventricular systolic function is normal. Tricuspid regurgitation signal is inadequate for assessing PA pressure. Left Atrium: Left atrial size was normal in size. Right Atrium: Right atrial size was normal in size. Pericardium: There is no evidence of pericardial effusion. Mitral Valve: The mitral valve is grossly normal. Trivial mitral valve regurgitation. No evidence of mitral valve stenosis. MV peak gradient, 6.2 mmHg. The mean mitral valve gradient is 2.0 mmHg. Tricuspid Valve: The tricuspid valve is grossly normal. Tricuspid  valve regurgitation is trivial. No evidence of tricuspid stenosis. Aortic Valve: The aortic valve is tricuspid. There is mild calcification of the aortic valve. There is mild thickening of the aortic valve. Aortic valve regurgitation is mild. Aortic regurgitation PHT measures 540 msec. Aortic valve sclerosis is present,  with no evidence of aortic valve stenosis. Aortic valve mean gradient measures 3.0 mmHg. Aortic valve peak gradient measures 6.0 mmHg. Aortic valve area, by VTI measures 2.71 cm. Pulmonic Valve: The pulmonic valve was grossly normal. Pulmonic valve regurgitation is trivial. No evidence of pulmonic stenosis. Aorta: The aortic root and ascending aorta are structurally normal, with no evidence of dilitation. Venous: The right lower pulmonary vein is normal. The inferior vena cava is normal in size with greater than 50% respiratory variability, suggesting right atrial pressure of 3 mmHg. IAS/Shunts: The atrial septum is grossly normal.  LEFT VENTRICLE PLAX 2D LVIDd:         4.70 cm   Diastology LVIDs:         3.30 cm   LV e' medial:    3.70 cm/s LV PW:         0.90 cm   LV E/e' medial:  16.1 LV IVS:        1.00 cm   LV e' lateral:   5.55 cm/s LVOT diam:     2.10 cm   LV E/e' lateral: 10.8 LV SV:         58 LV SV Index:   32 LVOT Area:      3.46 cm  RIGHT VENTRICLE RV Basal diam:  3.40 cm RV Mid diam:    2.80 cm RV S prime:     13.70 cm/s TAPSE (M-mode): 2.0 cm LEFT ATRIUM             Index        RIGHT ATRIUM           Index LA diam:        3.80 cm 2.12 cm/m   RA Area:     14.90 cm LA Vol (A2C):   66.3 ml 37.03 ml/m  RA Volume:   32.60 ml  18.21 ml/m LA Vol (A4C):   76.8 ml 42.89 ml/m LA Biplane Vol: 72.0 ml 40.21 ml/m  AORTIC VALVE                    PULMONIC VALVE AV Area (Vmax):    2.27 cm     PV Vmax:          0.84 m/s AV Area (Vmean):   2.61 cm     PV Peak grad:     2.8 mmHg AV Area (VTI):     2.71 cm     PR End Diast Vel: 3.33 msec AV Vmax:           122.00 cm/s  RVOT Peak grad:   2 mmHg AV Vmean:          74.000 cm/s AV VTI:            0.215 m AV Peak Grad:      6.0 mmHg AV Mean Grad:      3.0 mmHg LVOT Vmax:         79.80 cm/s LVOT Vmean:        55.800 cm/s LVOT VTI:          0.168 m LVOT/AV VTI ratio: 0.78 AI PHT:  540 msec  AORTA Ao Root diam: 2.80 cm Ao Asc diam:  3.40 cm MITRAL VALVE MV Area (PHT): 3.12 cm     SHUNTS MV Area VTI:   2.57 cm     Systemic VTI:  0.17 m MV Peak grad:  6.2 mmHg     Systemic Diam: 2.10 cm MV Mean grad:  2.0 mmHg MV Vmax:       1.25 m/s MV Vmean:      58.0 cm/s MV Decel Time: 243 msec MV E velocity: 59.70 cm/s MV A velocity: 104.00 cm/s MV E/A ratio:  0.57 Lennie Odor MD Electronically signed by Lennie Odor MD Signature Date/Time: 03/05/2023/3:47:09 PM    Final    MR ANGIO HEAD WO CONTRAST  Result Date: 03/05/2023 CLINICAL DATA:  Acute and chronic white matter infarcts. EXAM: MRA HEAD WITHOUT CONTRAST TECHNIQUE: Angiographic images of the Circle of Willis were acquired using MRA technique without intravenous contrast. COMPARISON:  MR head without contrast 03/04/2023. MR angio head 06/25/2020. FINDINGS: Anterior circulation: The internal carotid arteries are within normal limits from the high cervical segments through the ICA termini. The A1 and M1 segments are normal. The anterior  communicating artery is patent. MCA bifurcations are normal bilaterally. The ACA and MCA branch vessels are normal bilaterally. Posterior circulation: The right PICA origin is visualized and normal. The left PICA is below the field of view. Fenestrated chin of the vertebrobasilar junction is present without focal stenosis or aneurysm. The basilar artery is otherwise normal. The superior cerebellar arteries are patent. Both posterior cerebral arteries originate from basilar tip. The PCA branch vessels are normal bilaterally. Anatomic variants: Fenestration of the vertebrobasilar junction. Other: None. IMPRESSION: Normal MRA circle-of-Willis without significant proximal stenosis, aneurysm, or branch vessel occlusion. Electronically Signed   By: Marin Roberts M.D.   On: 03/05/2023 14:45   US Carotid Bilateral  Result Date: 03/05/2023 CLINICAL DATA:  Stroke Hypertension Hyperlipidemia Diabetes History of tobacco use EXAM: BILATERAL CAROTID DUPLEX ULTRASOUND TECHNIQUE: Wallace Cullens scale imaging, color Doppler and duplex ultrasound were performed of bilateral carotid and vertebral arteries in the neck. COMPARISON:  None available FINDINGS: Criteria: Quantification of carotid stenosis is based on velocity parameters that correlate the residual internal carotid diameter with NASCET-based stenosis levels, using the diameter of the distal internal carotid lumen as the denominator for stenosis measurement. The following velocity measurements were obtained: RIGHT ICA: 125/46 cm/sec CCA: 42/12 cm/sec SYSTOLIC ICA/CCA RATIO:  3.0 ECA: 24 cm/sec LEFT ICA: 127/54 cm/sec CCA: 42/13 cm/sec SYSTOLIC ICA/CCA RATIO:  3.0 ECA: 36 cm/sec RIGHT CAROTID ARTERY: Mildly elevated peak systolic and end-diastolic velocities of the right ICA are likely artifact given lack of significant atheromatous plaque. RIGHT VERTEBRAL ARTERY:  Antegrade flow. LEFT CAROTID ARTERY: Mildly elevated peak systolic and end-diastolic glottis disease of the left  ICA are likely artifact given only minimal atheromatous plaque at the origin. LEFT VERTEBRAL ARTERY:  Antegrade flow. IMPRESSION: 1. Less than 50% stenosis of the internal carotid arteries. 2. Minimal calcified plaque of the left ICA origin. Electronically Signed   By: Acquanetta Belling M.D.   On: 03/05/2023 11:32   MR BRAIN WO CONTRAST  Result Date: 03/04/2023 CLINICAL DATA:  Altered mental status, history of CVA with left-sided weakness EXAM: MRI HEAD WITHOUT CONTRAST TECHNIQUE: Multiplanar, multiecho pulse sequences of the brain and surrounding structures were obtained without intravenous contrast. COMPARISON:  CT head obtained earlier the same day, brain MRI 11/23/2021 FINDINGS: Brain: There are small foci of faintly elevated DWI signal in  the bilateral centrum semiovale (5-64) with faintly decreased ADC signal consistent with small acute to subacute infarcts. There is no hemorrhage or mass effect There is no other evidence of acute infarct. There is no acute intracranial hemorrhage or extra-axial fluid collection. Background parenchymal volume is normal. Multiple remote cortical infarcts in involving the bilateral cerebral cortex, deep gray nuclei, and cerebellar hemispheres are again seen. The ventricles are stable in size. There are punctate chronic microhemorrhages in the right paracentral lobule and right cerebellar hemisphere as well as a small amount of chronic blood products involving remote bilateral occipital infarcts. The pituitary and suprasellar region are normal. There is no mass lesion there is no mass effect or midline shift. Vascular: Normal flow voids. Skull and upper cervical spine: Normal marrow signal. Sinuses/Orbits: The paranasal sinuses are clear. The globes and orbits are unremarkable. Other: The mastoid air cells and middle ear cavities are clear. IMPRESSION: 1. Small acute to subacute infarcts in the bilateral centrum semiovale without hemorrhage or mass effect. 2. Multiple remote  infarcts in the supratentorial and infratentorial brain as above. Electronically Signed   By: Lesia Hausen M.D.   On: 03/04/2023 16:58     Family Communication: Sister at bedside yesterday updated regarding her care. All questions answered.  Disposition: Status is: Inpatient Remains inpatient appropriate because: new stroke, rehab placement.  Planned Discharge Destination: Home with Home Health    Time spent: 40 minutes  Author: Marcelino Duster, MD 03/06/2023 4:18 PM  For on call review [email or web address].

## 2023-03-06 NOTE — Progress Notes (Incomplete)
Incomplete     Expand All Collapse All PMR Admission Coordinator Pre-Admission Assessment   Patient: Erica [name] is an 67 y.o., female MRN: [id number] DOB: 1955/10/20 Height: 5\' 3"  (160 cm) Weight: 75.7 kg   Insurance Information HMO: yes    PPO:      PCP:      IPA:      80/20:      OTHER:  PRIMARY: [employer]      Policy#: [id number]      Subscriber: patient CM Name: ***      Phone#: ***     Fax#: [phone] Pre-Cert#: [id number]      Employer:  Benefits:  Phone #: n/a-online at [email or web address]     Name:  Eff. Date: 06/13/22     Deduct: $240 ($240 met)      Out of Pocket Max: $8,850 ($1,109.06 met)      Life Max: NA CIR: $2,080 co-pay/admission      SNF: 100% for days 1-20, $203/day co-pay for days 21-100 Outpatient: 80% coverage     Co-Pay: 20% co-insurance Home Health: 100% coverage      Co-Pay:  DME: 80% coverage     Co-Pay: 20% co-insurance Providers: in-network SECONDARY: Medicaid [place] [facility]      Policy#: [id number]     Phone#: [phone]   Financial Counselor:       Phone#:    The "Data Collection Information Summary" for patients in Inpatient Rehabilitation Facilities with attached "Privacy Act Statement-Health Care Records" was provided and verbally reviewed with: {CHL IP Patient Family UK:[id number]}   Emergency Contact Information Contact Information       Name Relation Home Work [place] Son [phone]             Other Contacts       Name Relation Home Work Mobile    [place] Sister [phone]        Jeffries,Crystal Niece     [phone]           Current Medical History  Patient Admitting Diagnosis: CVA History of Present Illness: Pt is a 67 year old female with medical hx significant for: prior CVA with residual left-sided weakness, IDDM, depression, anxiety, hyperlipidemia, GERD. Pt presented to [facility] on 03/04/23 d/t AMS. CT negative for acute abnormalities. MRI showed small acute to  subacute infarcts in bilateral centrum semiovale without hemorrhage or mass effect. Therapy evaluations completed and CIR recommended d/t pt's deficits in functional mobility.  Complete NIHSS TOTAL: 2   Patient's medical record from [facility] has been reviewed by the rehabilitation admission coordinator and physician.   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 No   Family History   family history includes Cancer in her maternal aunt, maternal uncle, maternal uncle, maternal uncle, paternal aunt, and paternal uncle; Cancer (age of onset: 43) in an other family member; Diabetes in her father; Heart disease in her mother; Hyperlipidemia in her mother; Hypertension in her brother, brother, father, mother, sister, and sister; Stroke in her father.   Current Medications  Current Medications    Current Facility-Administered Medications:    acetaminophen (TYLENOL) tablet 650 mg, 650 mg, Oral, Q4H PRN, 650 mg at 03/05/23 1203 **OR** acetaminophen (TYLENOL) 160 MG/5ML solution 650 mg, 650 mg, Per Tube, Q4H PRN **OR** acetaminophen (TYLENOL) suppository 650 mg, 650 mg, Rectal, Q4H PRN, Cox, Amy N, DO   albuterol (PROVENTIL) (2.5 MG/3ML) 0.083% nebulizer solution 2.5 mg, 2.5 mg, Inhalation, Q4H PRN, Cox, Amy N, DO   amLODipine (NORVASC) tablet 10 mg, 10 mg, Oral, Daily, Cox,  Amy N, DO, 10 mg at 03/05/23 7846   aspirin EC tablet 81 mg, 81 mg, Oral, Daily, Cox, Amy N, DO, 81 mg at 03/05/23 9629   ciprofloxacin (CIPRO) tablet 250 mg, 250 mg, Oral, BID, Cox, Amy N, DO, 250 mg at 03/05/23 0927   [START ON 03/06/2023] cyanocobalamin (VITAMIN B12) tablet 1,000 mcg, 1,000 mcg, Oral, Daily, Bhagat, Srishti L, MD   diclofenac Sodium (VOLTAREN) 1 % topical gel 2 g, 2 g, Topical, TID PRN, Cox, Amy N, DO   [START ON 03/06/2023] enoxaparin (LOVENOX) injection 40 mg, 40 mg, Subcutaneous, QHS, Bhagat, Srishti L, MD   gabapentin (NEURONTIN) capsule 100 mg, 100 mg, Oral, BID, Cox, Amy N, DO, 100 mg at 03/05/23 5284   HYDROcodone-acetaminophen (NORCO/VICODIN) 5-325 MG per tablet 1 tablet, 1 tablet, Oral, Q6H PRN, Cox, Amy N, DO   insulin aspart (novoLOG) injection 0-5 Units, 0-5 Units, Subcutaneous, QHS, Cox, Amy N, DO, 2 Units at 03/04/23 2107   insulin aspart (novoLOG) injection 0-9 Units, 0-9 Units, Subcutaneous, TID WC, Cox, Amy N, DO   pantoprazole (PROTONIX) EC tablet 40 mg, 40 mg, Oral, Daily, Cox, Amy N, DO, 40 mg at 03/05/23 1324   Pirfenidone TABS 801 mg, 1 tablet, Oral, TID with meals, Cox, Amy N, DO, 801 mg at 03/05/23 1246   rosuvastatin (CRESTOR) tablet 20 mg, 20 mg, Oral, QHS, Cox, Amy N, DO, 20 mg at 03/04/23 2106   sacubitril-valsartan (ENTRESTO) 49-51 mg per tablet, 1 tablet, Oral, BID, Cox, Amy N, DO, 1 tablet at 03/05/23 0930   senna-docusate (Senokot-S) tablet 1 tablet, 1 tablet, Oral, QHS PRN, Cox, Amy N, DO   sertraline (ZOLOFT) tablet 50 mg, 50 mg, Oral, Daily, Cox, Amy N, DO, 50 mg at 03/05/23 0927   [START ON 03/06/2023] thiamine (VITAMIN B1) 500 mg in sodium chloride 0.9 % 50 mL IVPB, 500 mg, Intravenous, Daily, Bhagat, Srishti L, MD   traZODone (DESYREL) tablet 100 mg, 100 mg, Oral, QHS, Cox, Amy N, DO, 100 mg at 03/04/23 2107   venlafaxine XR (EFFEXOR-XR) 24 hr capsule 150 mg, 150 mg, Oral, Daily, Cox, Amy N, DO, 150 mg at 03/05/23 4010     Patients Current  Diet:  Diet Order                  Diet heart healthy/carb modified Fluid consistency: Thin  Diet effective now  Precautions / Restrictions Precautions Precautions: Fall Restrictions Weight Bearing Restrictions: No    Has the patient had 2 or more falls or a fall with injury in the past year? No   Prior Activity Level Community (5-7x/wk): gets out of house often   Prior Functional Level Self Care: Did the patient need help bathing, dressing, using the toilet or eating? Independent   Indoor Mobility: Did the patient need assistance with walking from room to room (with or without device)? Independent   Stairs: Did the patient need assistance with internal or external stairs (with or without device)? {Prior Functional ZOXWR:[id number]}   Functional Cognition: Did the patient need help planning regular tasks such as shopping or remembering to take medications? Independent   Patient Information Are you of Hispanic, Latino/a,or Spanish origin?: A. No, not of Hispanic, Latino/a, or Spanish origin What is your race?: B. Black or African American Do you need or want an interpreter to communicate with a doctor or health care staff?: 0. No   Patient's Response To:  Health Literacy and Transportation Is the patient able to respond to health literacy and transportation needs?: Yes Health Literacy - How often do you need to have someone help you when you read instructions, pamphlets, or other written material from your doctor or pharmacy?: Never In the past 12 months, has lack of transportation kept you from medical appointments or from getting medications?: No In the past 12 months, has lack of transportation kept you from meetings, work, or from getting things needed for daily living?: No   Home Assistive Devices / Equipment Home Assistive Devices/Equipment: Medical laboratory scientific officer (specify quad or straight) Home Equipment: Cane - single point, Shower seat   Prior Device Use:  Indicate devices/aids used by the patient prior to current illness, exacerbation or injury? Walker and cane   Current Functional Level Cognition   Arousal/Alertness: Awake/alert Overall Cognitive Status: Impaired/Different from baseline Orientation Level: Oriented X4 General Comments: Pleasant and cooperative Comments: deferred due to increased lethargy    Extremity Assessment (includes Sensation/Coordination)   Upper Extremity Assessment: Generalized weakness, LUE deficits/detail LUE Deficits / Details: decreased coordination and dexterity but also had prior CVA with residual deficits on L side  Lower Extremity Assessment: Generalized weakness, Difficult to assess due to impaired cognition     ADLs   Overall ADL's : Needs assistance/impaired Grooming: Wash/dry hands, Wash/dry face, Standing, Minimal assistance Toilet Transfer: Moderate assistance, Ambulation, Rolling walker (2 wheels)     Mobility   Overal bed mobility: Needs Assistance Bed Mobility: Supine to Sit, Sit to Supine Supine to sit: Supervision Sit to supine: Supervision General bed mobility comments: increased time and effort to complete     Transfers   Overall transfer level: Needs assistance Equipment used: 1 person hand held assist Transfers: Sit to/from Stand Sit to Stand: Mod assist General transfer comment: pt with slow, steady transitional movement into standing from EOB x2 with appropriate hand placement adn technique utilized with CGA for safety     Ambulation / Gait / Stairs / Wheelchair Mobility   Ambulation/Gait Ambulation/Gait assistance: Contact guard assist Assistive device: Rolling walker (2 wheels) General Gait Details: pt rapidly fatiguing in standing; she was able to take a few side steps bilaterally at EOB but unable to ambulate any further secondary to significant fatigue. Gait velocity: decr     Posture / Balance Balance Overall balance assessment: Needs assistance Sitting-balance  support: Feet supported Sitting balance-Leahy Scale: Fair Standing balance support: Bilateral upper extremity supported, Reliant on assistive  device for balance, During functional activity Standing balance-Leahy Scale: Poor     Special needs/care consideration Diabetic management Novolog 0-5 units daily at bedtime; Novolog 0-9 units 3x daily with meals    Previous Home Environment (from acute therapy documentation) Living Arrangements: Other relatives (sister)  Lives With: Family (sister) Available Help at Discharge: Family, Available 24 hours/day Type of Home: House Home Layout: One level (one step from kitchen to another room) Home Access: Level entry Bathroom Shower/Tub: Tub only (sister will take her pt's niece's house which is next door to take a shower) Bathroom Toilet: Handicapped height Bathroom Accessibility: Yes How Accessible: Accessible via walker Home Care Services: Yes Type of Home Care Services: Homehealth aide (comes 3 days/week)   Discharge Living Setting Plans for Discharge Living Setting: House (sister's house) Type of Home at Discharge: House Discharge Home Layout: One level (one step from kitchen to another room) Discharge Home Access: Level entry Discharge Bathroom Shower/Tub: Tub only (sister will take pt to neice's house next door to take a shower) Discharge Bathroom Toilet: Handicapped height Discharge Bathroom Accessibility: Yes How Accessible: Accessible via walker Does the patient have any problems obtaining your medications?: No   Social/Family/Support Systems Anticipated Caregiver: [name], sister Anticipated Caregiver's Contact Information: [phone] Caregiver Availability: 24/7 Discharge Plan Discussed with Primary Caregiver: Yes Is Caregiver In Agreement with Plan?: Yes Does Caregiver/Family have Issues with Lodging/Transportation while Pt is in Rehab?: No   Goals Patient/Family Goal for Rehab: 7-10 days Expected length of stay:  Supervision: PT/OT/ST Pt/Family Agrees to Admission and willing to participate: Yes Program Orientation Provided & Reviewed with Pt/Caregiver Including Roles  & Responsibilities: Yes   Decrease burden of Care through IP rehab admission: NA   Possible need for SNF placement upon discharge: Not anticipated   Patient Condition: I have reviewed medical records from [facility], spoken with CSW, and patient and family member. I discussed via phone for inpatient rehabilitation assessment.  Patient will benefit from ongoing PT, OT, and SLP, can actively participate in 3 hours of therapy a day 5 days of the week, and can make measurable gains during the admission.  Patient will also benefit from the coordinated team approach during an Inpatient Acute Rehabilitation admission.  The patient will receive intensive therapy as well as Rehabilitation physician, nursing, social worker, and care management interventions.  Due to safety, disease management, medication administration, pain management, and patient education the patient requires 24 hour a day rehabilitation nursing.  The patient is currently Contact G-Mod A with mobility and Min A with basic ADLs.  Discharge setting and therapy post discharge at home with home health is anticipated.  Patient has agreed to participate in the Acute Inpatient Rehabilitation Program and will admit {Time; today/tomorrow:10263}.   Preadmission Screen Completed By:  Domingo Pulse, 03/05/2023 3:33 PM ________________________________________________

## 2023-03-06 NOTE — Progress Notes (Signed)
Physical Therapy Treatment Patient Details Name: Erica [name] MRN: [id number] DOB: 03-Dec-1955 Today's Date: 03/06/2023   History of Present Illness Pt is a 67 y/o female admitted secondary to AMS, R facial droop and communication difficulties. MRI of the brain revealed small acute/subacute infarcts of the bilateral centrum semiovale without hemorrhage or mass effect. PMH including but not limited to CVA, DM, anxiety, depression, GERD.    PT Comments  Pt received up in chair after working with OT earlier this morning. Discussed role of PT and current acute goals with focus on CIR once medically cleared for d/c. Pt demonstrates L LE weakness and motor control deficits. Decreased control with swing through and heel strike on L during gait training 75ft x 3 with RW and MinA. Pt fatigues quickly requiring multiple rest breaks, however is very motivated to participate and return to previous independent LOF.   If plan is discharge home, recommend the following: A little help with walking and/or transfers;A lot of help with walking and/or transfers;A lot of help with bathing/dressing/bathroom;A little help with bathing/dressing/bathroom;Assistance with cooking/housework;Direct supervision/assist for medications management;Direct supervision/assist for financial management;Assist for transportation;Help with stairs or ramp for entrance;Supervision due to cognitive status   Can travel by private vehicle     No  Equipment Recommendations  Other (comment) (Defer to next level of care)    Recommendations for Other Services       Precautions / Restrictions Precautions Precautions: Fall Restrictions Weight Bearing Restrictions: No     Mobility  Bed Mobility               General bed mobility comments: Pt in recliner pre/post session    Transfers Overall transfer level: Needs assistance Equipment used: Rolling walker (2 wheels) Transfers: Sit to/from Stand Sit to Stand: Mod assist            General transfer comment: Increased time to complete task and sequencing    Ambulation/Gait Ambulation/Gait assistance: Contact guard assist, Min assist Gait Distance (Feet):  (10) Assistive device: Rolling walker (2 wheels) Gait Pattern/deviations: Step-to pattern, Ataxic, Drifts right/left, Narrow base of support, Decreased dorsiflexion - left Gait velocity: decr     General Gait Details: Pt quick to fatigue requiring rest breaks betweeen gait training 34ft x 3. Remains a high fall risk due to decreased strength and motor control L LE.   Stairs             Wheelchair Mobility     Tilt Bed    Modified Rankin (Stroke Patients Only)       Balance Overall balance assessment: Needs assistance Sitting-balance support: Feet supported Sitting balance-Leahy Scale: Fair     Standing balance support: Bilateral upper extremity supported, Reliant on assistive device for balance, During functional activity Standing balance-Leahy Scale: Poor Standing balance comment:  (MinA to maintain balance while ambulating with RW)                            Cognition Arousal: Alert Behavior During Therapy: WFL for tasks assessed/performed Overall Cognitive Status: Within Functional Limits for tasks assessed                                 General Comments: Pleasant and cooperative        Exercises General Exercises - Lower Extremity Ankle Circles/Pumps: AROM, Both, 10 reps, Seated Long Arc Quad: AROM, Both, Seated Hip  ABduction/ADduction: AROM, Both, 10 reps, Seated Hip Flexion/Marching: AROM, Both, 10 reps, Seated    General Comments General comments (skin integrity, edema, etc.):  (Decreased strength and motor control L LE)      Pertinent Vitals/Pain Pain Assessment Pain Assessment: No/denies pain    Home Living                          Prior Function            PT Goals (current goals can now be found in the care plan  section) Acute Rehab PT Goals Patient Stated Goal: to get stronger Progress towards PT goals: Progressing toward goals    Frequency    Min 1X/week      PT Plan      Co-evaluation              AM-PAC PT "6 Clicks" Mobility   Outcome Measure  Help needed turning from your back to your side while in a flat bed without using bedrails?: A Little Help needed moving from lying on your back to sitting on the side of a flat bed without using bedrails?: A Little Help needed moving to and from a bed to a chair (including a wheelchair)?: A Little Help needed standing up from a chair using your arms (e.g., wheelchair or bedside chair)?: A Little Help needed to walk in hospital room?: A Lot Help needed climbing 3-5 steps with a railing? : Total 6 Click Score: 15    End of Session Equipment Utilized During Treatment: Gait belt Activity Tolerance: Patient limited by fatigue Patient left: in chair;with call bell/phone within reach;with chair alarm set Nurse Communication: Mobility status PT Visit Diagnosis: Other abnormalities of gait and mobility (R26.89)     Time: 1033-1100 PT Time Calculation (min) (ACUTE ONLY): 27 min  Charges:    $Gait Training: 8-22 mins $Therapeutic Exercise: 8-22 mins PT General Charges $$ ACUTE PT VISIT: 1 Visit                    Zadie Cleverly, PTA  Jannet Askew 03/06/2023, 1:53 PM

## 2023-03-06 NOTE — Progress Notes (Signed)
Occupational Therapy Treatment Patient Details Name: Erica [name] MRN: [id number] DOB: December 15, 1955 Today's Date: 03/06/2023   History of present illness Pt is a 67 y/o female admitted secondary to AMS, R facial droop and communication difficulties. MRI of the brain revealed small acute/subacute infarcts of the bilateral centrum semiovale without hemorrhage or mass effect. PMH including but not limited to CVA, DM, anxiety, depression, GERD.   OT comments  Upon entering the room, pt supine in bed and agreeable to OT intervention. Pt performing bed mobility without assistance but increased effort. Pt requesting to don LB clothing this session. Use of figure four position to don clothing onto B feet and pt stands with min A for balance to pull over B hips and fasten with increased time. Pt takes several steps over to sink with min - mod A and brushes teeth and washes face with increased time to sequence and coordination B UE tasks. Pt ambulates 50' min HHA and back to room at end of session. Pt returning to bed to rest with call bell and all needed items within reach.       If plan is discharge home, recommend the following:  A lot of help with walking and/or transfers;A little help with bathing/dressing/bathroom;Assistance with cooking/housework;Assist for transportation;Help with stairs or ramp for entrance   Equipment Recommendations  Other (comment) (defer to next venue of care)       Precautions / Restrictions Precautions Precautions: Fall Restrictions Weight Bearing Restrictions: No       Mobility Bed Mobility Overal bed mobility: Needs Assistance Bed Mobility: Supine to Sit, Sit to Supine     Supine to sit: Supervision Sit to supine: Supervision   General bed mobility comments: increased time and effort    Transfers Overall transfer level: Needs assistance Equipment used: Rolling walker (2 wheels) Transfers: Sit to/from Stand Sit to Stand: Mod assist                  Balance Overall balance assessment: Needs assistance Sitting-balance support: Feet supported Sitting balance-Leahy Scale: Fair     Standing balance support: Bilateral upper extremity supported, Reliant on assistive device for balance, During functional activity Standing balance-Leahy Scale: Poor                             ADL either performed or assessed with clinical judgement   ADL Overall ADL's : Needs assistance/impaired     Grooming: Wash/dry hands;Wash/dry face;Standing;Contact guard assist               Lower Body Dressing: Minimal assistance;Sit to/from stand Lower Body Dressing Details (indicate cue type and reason): pants                    Extremity/Trunk Assessment Upper Extremity Assessment Upper Extremity Assessment: Generalized weakness;LUE deficits/detail            Vision Patient Visual Report: No change from baseline            Cognition Arousal: Alert Behavior During Therapy: WFL for tasks assessed/performed Overall Cognitive Status: Within Functional Limits for tasks assessed                               Problem Solving: Slow processing General Comments: Pleasant and cooperative                   Pertinent Vitals/  Pain       Pain Assessment Pain Assessment: No/denies pain         Frequency  Min 1X/week        Progress Toward Goals  OT Goals(current goals can now be found in the care plan section)  Progress towards OT goals: Progressing toward goals      AM-PAC OT "6 Clicks" Daily Activity     Outcome Measure   Help from another person eating meals?: None Help from another person taking care of personal grooming?: A Little Help from another person toileting, which includes using toliet, bedpan, or urinal?: A Lot Help from another person bathing (including washing, rinsing, drying)?: A Lot Help from another person to put on and taking off regular upper body clothing?: A  Little Help from another person to put on and taking off regular lower body clothing?: A Lot 6 Click Score: 16    End of Session Equipment Utilized During Treatment: Rolling walker (2 wheels)  OT Visit Diagnosis: Unsteadiness on feet (R26.81);Repeated falls (R29.6);Muscle weakness (generalized) (M62.81);Other (comment)   Activity Tolerance Patient tolerated treatment well   Patient Left with call bell/phone within reach;in bed;with bed alarm set   Nurse Communication Mobility status        Time: 4098-1191 OT Time Calculation (min): 21 min  Charges: OT General Charges $OT Visit: 1 Visit OT Treatments $Self Care/Home Management : 8-22 mins  Jackquline Denmark, MS, OTR/L , CBIS ascom [phone]  03/06/23, 1:17 PM

## 2023-03-06 NOTE — Progress Notes (Signed)
Introduced patient to role of Statistician. Patient is from [place], and plans to return to [place] once she is able to move into a new apartment. Patient currently staying with her sister Roberto Scales [phone]), as she is technically homeless.   Patient states she receives $900/mo in SSI/SSDI. She has a [address] 8 apartment in [place] that she is on the waitlist for pending inspection and her ability to pay 1st month rent ($800). [employer] will be paying her security deposit. Apartment to be inspected on Oct.1.   Patient states she uses [employer] or her sister for transportation to/from appointments. Patient does endorse some food insecurity when she is living by herself.   Patient with several questions regarding CIR. OT entered room at end of discussion and will address.

## 2023-03-06 NOTE — Plan of Care (Signed)
  Problem: Ischemic Stroke/TIA Tissue Perfusion: Goal: Complications of ischemic stroke/TIA will be minimized Outcome: Progressing   Problem: Coping: Goal: Will verbalize positive feelings about self Outcome: Progressing Goal: Will identify appropriate support needs Outcome: Progressing   Problem: Activity: Goal: Risk for activity intolerance will decrease Outcome: Progressing   Problem: Elimination: Goal: Will not experience complications related to bowel motility Outcome: Progressing Goal: Will not experience complications related to urinary retention Outcome: Progressing   Problem: Pain Managment: Goal: General experience of comfort will improve Outcome: Progressing

## 2023-03-07 DIAGNOSIS — E1165 Type 2 diabetes mellitus with hyperglycemia: Secondary | ICD-10-CM | POA: Diagnosis not present

## 2023-03-07 DIAGNOSIS — I10 Essential (primary) hypertension: Secondary | ICD-10-CM | POA: Diagnosis not present

## 2023-03-07 DIAGNOSIS — I639 Cerebral infarction, unspecified: Secondary | ICD-10-CM | POA: Diagnosis not present

## 2023-03-07 DIAGNOSIS — F332 Major depressive disorder, recurrent severe without psychotic features: Secondary | ICD-10-CM | POA: Diagnosis not present

## 2023-03-07 LAB — GLUCOSE, CAPILLARY
Glucose-Capillary: 175 mg/dL — ABNORMAL HIGH (ref 70–99)
Glucose-Capillary: 198 mg/dL — ABNORMAL HIGH (ref 70–99)
Glucose-Capillary: 153 mg/dL — ABNORMAL HIGH (ref 70–99)
Glucose-Capillary: 232 mg/dL — ABNORMAL HIGH (ref 70–99)

## 2023-03-07 LAB — ANCA TITERS
Atypical P-ANCA titer: <1:20 {titer}
C-ANCA: <1:20 {titer}
P-ANCA: <1:20 {titer}

## 2023-03-07 MED ORDER — ENOXAPARIN SODIUM 40 MG/0.4ML IJ SOSY
40.0000 mg | PREFILLED_SYRINGE | INTRAMUSCULAR | Status: DC
  Administered 2023-03-07: 40 mg via SUBCUTANEOUS
  Filled 2023-03-07: qty 0.4

## 2023-03-07 MED ORDER — CLOPIDOGREL BISULFATE 75 MG PO TABS: 75.0000 mg | ORAL_TABLET | Freq: Every day | ORAL | Status: DC

## 2023-03-07 MED ORDER — CLOPIDOGREL BISULFATE 75 MG PO TABS
75.0000 mg | ORAL_TABLET | Freq: Every day | ORAL | Status: DC
  Administered 2023-03-08: 75 mg via ORAL
  Filled 2023-03-07: qty 1

## 2023-03-07 MED ORDER — APIXABAN 5 MG PO TABS: 5.0000 mg | ORAL_TABLET | Freq: Two times a day (BID) | ORAL | Status: DC

## 2023-03-07 NOTE — Progress Notes (Signed)
Inpatient Rehab Admissions Coordinator:  Awaiting insurance authorization and bed availability. Will continue to follow.   Wolfgang Phoenix, MS, CCC-SLP Admissions Coordinator [phone]

## 2023-03-07 NOTE — Progress Notes (Signed)
Occupational Therapy Treatment Patient Details Name: Erica [name] MRN: [id number] DOB: 1956/06/06 Today's Date: 03/07/2023   History of present illness Pt is a 67 y/o female admitted secondary to AMS, R facial droop and communication difficulties. MRI of the brain revealed small acute/subacute infarcts of the bilateral centrum semiovale without hemorrhage or mass effect. PMH including but not limited to CVA, DM, anxiety, depression, GERD.   OT comments  [name] was seen for OT treatment on this date. Upon arrival to room pt reclined in bed, agreeable to tx. Pt requires MOD A + HHA for toilet t/f with L foot drop noted, improves to MIN A with use of RW, asssit for RW mgmt. CGA hand washing standing sink side. MIN A don B shoes in sitting, assist to tie shoe laces.   Tolerated standing LUE neuromuscular re-education: composite finger adduction/abduction, isolated digit tappnig, and isolated digit flexion/extension. Unable to recall exercises at end of session, sister at bedside provided education and agreeable to assist. Pt making good progress toward goals, will continue to follow POC. Discharge recommendation remains appropriate.        If plan is discharge home, recommend the following:  A lot of help with walking and/or transfers;A little help with bathing/dressing/bathroom;Assistance with cooking/housework;Assist for transportation;Help with stairs or ramp for entrance   Equipment Recommendations  Other (comment) (defer to next venue of care)    Recommendations for Other Services      Precautions / Restrictions Precautions Precautions: Fall Restrictions Weight Bearing Restrictions: No       Mobility Bed Mobility Overal bed mobility: Needs Assistance Bed Mobility: Supine to Sit, Sit to Supine     Supine to sit: Supervision Sit to supine: Supervision        Transfers Overall transfer level: Needs assistance Equipment used: 1 person hand held assist Transfers: Sit  to/from Stand Sit to Stand: Min assist                 Balance Overall balance assessment: Needs assistance Sitting-balance support: Feet supported Sitting balance-Leahy Scale: Fair     Standing balance support: No upper extremity supported, During functional activity Standing balance-Leahy Scale: Fair                             ADL either performed or assessed with clinical judgement   ADL Overall ADL's : Needs assistance/impaired                                       General ADL Comments: MOD A + HHA for toilet t/f, improves to MIN A with use of RW, asssit for RW mgmt. CGA hand washing standing sink side. MIN A don B shoes in sitting, assist to tie shoe laces.       Cognition Arousal: Alert Behavior During Therapy: WFL for tasks assessed/performed Overall Cognitive Status: Within Functional Limits for tasks assessed Area of Impairment: Memory, Problem solving                     Memory: Decreased short-term memory       Problem Solving: Slow processing          Exercises Exercises: Other exercises Other Exercises Other Exercises: Standing LUE exercises: composite finger adduction/abduction, isolated digit tappnig, and isolated digit flexion/extension  Pertinent Vitals/ Pain       Pain Assessment Pain Assessment: No/denies pain   Frequency  Min 1X/week        Progress Toward Goals  OT Goals(current goals can now be found in the care plan section)  Progress towards OT goals: Progressing toward goals  Acute Rehab OT Goals Patient Stated Goal: to return to PLOF OT Goal Formulation: With patient Time For Goal Achievement: 03/19/23 Potential to Achieve Goals: Fair ADL Goals Pt Will Perform Grooming: with supervision;standing Pt Will Perform Lower Body Dressing: with supervision;sit to/from stand Pt Will Transfer to Toilet: with supervision;ambulating Pt Will Perform Toileting - Clothing  Manipulation and hygiene: with supervision;sit to/from stand   AM-PAC OT "6 Clicks" Daily Activity     Outcome Measure   Help from another person eating meals?: None Help from another person taking care of personal grooming?: A Little Help from another person toileting, which includes using toliet, bedpan, or urinal?: A Little Help from another person bathing (including washing, rinsing, drying)?: A Lot Help from another person to put on and taking off regular upper body clothing?: A Little Help from another person to put on and taking off regular lower body clothing?: A Little 6 Click Score: 18    End of Session Equipment Utilized During Treatment: Rolling walker (2 wheels);Gait belt  OT Visit Diagnosis: Unsteadiness on feet (R26.81);Repeated falls (R29.6);Muscle weakness (generalized) (M62.81);Other (comment)   Activity Tolerance Patient tolerated treatment well   Patient Left in bed;with call bell/phone within reach;with family/visitor present   Nurse Communication          Time: 1610-9604 OT Time Calculation (min): 24 min  Charges: OT General Charges $OT Visit: 1 Visit OT Treatments $Self Care/Home Management : 8-22 mins $Neuromuscular Re-education: 8-22 mins  Kathie Dike, M.S. OTR/L  03/07/23, 3:40 PM  ascom [phone]

## 2023-03-07 NOTE — Plan of Care (Signed)
  Problem: Ischemic Stroke/TIA Tissue Perfusion: Goal: Complications of ischemic stroke/TIA will be minimized Outcome: Progressing   Problem: Coping: Goal: Will verbalize positive feelings about self Outcome: Progressing Goal: Will identify appropriate support needs Outcome: Progressing   Problem: Nutrition: Goal: Risk of aspiration will decrease Outcome: Progressing Goal: Dietary intake will improve Outcome: Progressing   Problem: Activity: Goal: Risk for activity intolerance will decrease Outcome: Progressing   Problem: Nutrition: Goal: Adequate nutrition will be maintained Outcome: Progressing

## 2023-03-07 NOTE — Progress Notes (Addendum)
Progress Note   Patient: Erica [name]:[id number] DOB: August 24, 1955 DOA: 03/04/2023     2 DOS: the patient was seen and examined on 03/07/2023   Brief hospital course: Erica [name] is a 67 year old female with history of CVA with left-sided residual deficits, insulin-dependent diabetes mellitus, depression, anxiety, hyperlipidemia, GERD, who presents emergency department for chief concerns of altered mental status.  MRI brain reviewed showed Small acute to subacute infarcts in the bilateral centrum semiovale without hemorrhage or mass effect. Multiple remote infarcts in the supratentorial and infratentorial Brain.  Assessment and Plan: * Acute ischemic stroke Altered mental status with right-sided mouth droop upon presentation. MRI of the brain showed small acute to sub acute stroke Complete echo showed normal EF no intracardiac source of embolism. EP interrogated loop recorder, no evidence of Afib. Neurology follow up appreciated - Bilateral small vessel disease appearing strokes, less likely cardioembolic, advised dual antiplatelets for 21 days. PT/ OT recommended rehab. Continue aspirin, plavix(21 days) and statin. Heart healthy/ carb modified  Controlled type 2 diabetes mellitus with hyperglycemia, with long-term current use of insulin (HCC) A1c 6.9 Lantus 20 units qhs and monitor her blood sugars. Continue accucheks, sliding scale. Hypoglycemia protocol.  Benign essential HTN BP stable Continue Norvasc.  Prior h/o CVA (cerebral vascular accident) (HCC) Continue aspirin 81 mg daily, rosuvastatin 40 mg nightly.  MDD (major depressive disorder), recurrent severe, without psychosis (HCC) Continue sertraline 50 mg daily, venlafaxine 150 mg daily, trazodone 100 mg nightly were resumed on admission  COPD (chronic obstructive pulmonary disease) (HCC) No acute exacerbation Resumed albuterol inhalation every 6 hours as needed for wheezing and shortness of breath  Interstitial  lung disease (HCC) Home pirfenidone 801 mg p.o. 3 times daily with meals resumed on admission.  TOC working on inpatient rehab placement, insurance auth.      Subjective: Patient is seen and examined today morning.  She is lying comfortably, no complaints. Erica [name] at bedside.  Able to answer my questions appropriately. Working with PT.  Physical Exam: Vitals:   03/07/23 0731 03/07/23 1126 03/07/23 1550 03/07/23 1950  BP: 131/74 135/71 127/74 136/79  Pulse: 66 62 66 68  Resp: 14 15 19 18   Temp: 98.6 F (37 C) 98.3 F (36.8 C) 98.2 F (36.8 C) 97.9 F (36.6 C)  TempSrc:   Oral Oral  SpO2: 100% 100% 100% 100%  Weight:      Height:       General - Elderly African-American female, no apparent distress HEENT - PERRLA, EOMI, atraumatic head, non tender sinuses. Lung - Clear, rales, rhonchi, wheezes. Heart - S1, S2 heard, no murmurs, rubs, no pedal edema Neuro - Alert, awake and oriented x 3, left sided weakness, speech difficulty. Skin - Warm and dry. Data Reviewed:     Latest Ref Rng & Units 03/06/2023    4:39 AM 03/04/2023    9:30 AM 01/06/2023    4:42 PM  CBC  WBC 4.0 - 10.5 K/uL 3.8  5.5  3.9   Hemoglobin 12.0 - 15.0 g/dL 29.5  28.4  13.2   Hematocrit 36.0 - 46.0 % 27.5  31.9  31.7   Platelets 150 - 400 K/uL 146  163  145       Latest Ref Rng & Units 03/06/2023    4:39 AM 03/04/2023    9:30 AM 01/06/2023    4:42 PM  BMP  Glucose 70 - 99 mg/dL 440  93  102   BUN 8 - 23 mg/dL 22  33  16   Creatinine 0.44 - 1.00 mg/dL 1.61  0.96  0.45   Sodium 135 - 145 mmol/L 139  138  140   Potassium 3.5 - 5.1 mmol/L 3.7  3.8  3.9   Chloride 98 - 111 mmol/L 115  111  112   CO2 22 - 32 mmol/L 19  20  21    Calcium 8.9 - 10.3 mg/dL 8.1  8.6  8.6    No results found.   Family Communication: Erica [name] at bedside yesterday updated regarding her care. All questions answered.  Disposition: Status is: Inpatient Remains inpatient appropriate because: Inpatient rehab placement, pending  auth.  Planned Discharge Destination: Rehab    Time spent: 37 minutes  Author: Marcelino Duster, MD 03/07/2023 7:55 PM  For on call review [email or web address].

## 2023-03-07 NOTE — Progress Notes (Signed)
Physical Therapy Treatment Patient Details Name: KEONA MUHA MRN: [id number] DOB: 1955-07-22 Today's Date: 03/07/2023   History of Present Illness Pt is a 67 y/o female admitted secondary to AMS, R facial droop and communication difficulties. MRI of the brain revealed small acute/subacute infarcts of the bilateral centrum semiovale without hemorrhage or mass effect. PMH including but not limited to CVA, DM, anxiety, depression, GERD.    PT Comments  Pt received in bed, slightly fatigued due to increased activity this date with staff, OT, and agreeable to PT. Pt able to transfer to EOB with increased time and support of side rail. Fair balance sitting EOB and able to don socks with Supervision. Sit to stand with MinA. Focused session on gait training with SPC and CG/MinA (pt uses  SPC at baseline). Increased assist provided for steadiness and safety. Pt only able to tolerate 70ft with increased fatigue noted. Will continue to progress acutely. Pt will benefit from continued PT post d/c.   If plan is discharge home, recommend the following: A little help with walking and/or transfers;A lot of help with walking and/or transfers;A lot of help with bathing/dressing/bathroom;A little help with bathing/dressing/bathroom;Assistance with cooking/housework;Direct supervision/assist for medications management;Direct supervision/assist for financial management;Assist for transportation;Help with stairs or ramp for entrance;Supervision due to cognitive status   Can travel by private vehicle     No  Equipment Recommendations  Other (comment) (TBD at next level of care)    Recommendations for Other Services       Precautions / Restrictions Precautions Precautions: Fall Restrictions Weight Bearing Restrictions: No     Mobility  Bed Mobility Overal bed mobility: Needs Assistance Bed Mobility: Supine to Sit, Sit to Supine     Supine to sit: Supervision Sit to supine: Supervision   General bed  mobility comments: increased time and effort, able to don socks while sitting EOB    Transfers Overall transfer level: Needs assistance Equipment used: 1 person hand held assist Transfers: Sit to/from Stand Sit to Stand: Min assist                Ambulation/Gait Ambulation/Gait assistance: Contact guard assist, Min assist Gait Distance (Feet): 80 Feet Assistive device: Straight cane Gait Pattern/deviations: Step-to pattern, Ataxic, Drifts right/left, Narrow base of support, Decreased dorsiflexion - left Gait velocity: decr     General Gait Details: Remains a high fall risk due to decreased strength and motor control L LE. Increased balance with use of RW   Stairs             Wheelchair Mobility     Tilt Bed    Modified Rankin (Stroke Patients Only)       Balance Overall balance assessment: Needs assistance Sitting-balance support: Feet supported Sitting balance-Leahy Scale: Fair     Standing balance support: No upper extremity supported, During functional activity Standing balance-Leahy Scale: Fair                              Cognition Arousal: Alert Behavior During Therapy: WFL for tasks assessed/performed Overall Cognitive Status: Within Functional Limits for tasks assessed Area of Impairment: Memory, Problem solving                     Memory: Decreased short-term memory       Problem Solving: Slow processing General Comments: Pleasant and cooperative        Exercises General Exercises - Lower Extremity Ankle Circles/Pumps:  AROM, Both, 10 reps, Seated Long Arc Quad: AROM, Both, Seated Hip Flexion/Marching: AROM, Both, 10 reps, Seated    General Comments        Pertinent Vitals/Pain Pain Assessment Pain Assessment: No/denies pain    Home Living                          Prior Function            PT Goals (current goals can now be found in the care plan section) Progress towards PT goals:  Progressing toward goals    Frequency    Min 1X/week      PT Plan      Co-evaluation              AM-PAC PT "6 Clicks" Mobility   Outcome Measure  Help needed turning from your back to your side while in a flat bed without using bedrails?: A Little Help needed moving from lying on your back to sitting on the side of a flat bed without using bedrails?: A Little Help needed moving to and from a bed to a chair (including a wheelchair)?: A Little Help needed standing up from a chair using your arms (e.g., wheelchair or bedside chair)?: A Little Help needed to walk in hospital room?: A Lot Help needed climbing 3-5 steps with a railing? : Total 6 Click Score: 15    End of Session Equipment Utilized During Treatment: Gait belt Activity Tolerance: Patient limited by fatigue Patient left: with call bell/phone within reach;in bed;with bed alarm set Nurse Communication: Mobility status PT Visit Diagnosis: Other abnormalities of gait and mobility (R26.89)     Time: 0272-5366 PT Time Calculation (min) (ACUTE ONLY): 28 min  Charges:    $Gait Training: 8-22 mins $Therapeutic Exercise: 8-22 mins PT General Charges $$ ACUTE PT VISIT: 1 Visit                    Zadie Cleverly, PTA  Jannet Askew 03/07/2023, 3:58 PM

## 2023-03-07 NOTE — TOC Initial Note (Signed)
Transition of Care [facility]) - Initial/Assessment Note    Patient Details  Name: Erica [name] MRN: [id number] Date of Birth: 1955-07-02  Transition of Care [facility]) CM/SW Contact:    Allena Katz, LCSW Phone Number: 03/07/2023, 10:56 AM  Clinical Narrative:        CSW met family at bedside who reported they are looking for any resources that may be available to them after CIR. CSW explained CIR would order [facility] or any needed DME once stay was over. Family reports they would like aide assistance. CSW explained to family how to apply for PCS services through [facility]. Pt's family to work on this while she is in rehab. Pt's plan is to return to stay with her sister at discharge. Pt active with Dr. Concepcion Elk for primary care. TOC following for care needs. Auth is still pending for CIR.               Expected Discharge Plan:  ([facility] inpatient rehab) Barriers to Discharge: Continued Medical Work up   Patient Goals and CMS Choice Patient states their goals for this hospitalization and ongoing recovery are:: return home CMS Medicare.gov Compare Post Acute Care list provided to:: Patient        Expected Discharge Plan and Services       Living arrangements for the past 2 months: Single Family Home                                      Prior Living Arrangements/Services Living arrangements for the past 2 months: Single Family Home Lives with:: Self Patient language and need for interpreter reviewed:: Yes          Care giver support system in place?: Yes (comment) Current home services: DME (cane)    Activities of Daily Living Home Assistive Devices/Equipment: Cane (specify quad or straight) ADL Screening (condition at time of admission) Is the patient deaf or have difficulty hearing?: No Does the patient have difficulty seeing, even when wearing glasses/contacts?: Yes (patient states she lost her glasses) Does the patient have difficulty concentrating, remembering, or making  decisions?: No  Permission Sought/Granted                  Emotional Assessment       Orientation: : Oriented to Self, Oriented to Place, Oriented to  Time, Oriented to Situation      Admission diagnosis:  Shortness of breath [R06.02] Dizzy [R42] Nonrheumatic aortic valve insufficiency [I35.1] Stroke-like symptom [R29.90] Secondary hypertension [I15.9] Altered mental status, unspecified altered mental status type [R41.82] Acute ischemic stroke [facility]) [I63.9] Patient Active Problem List   Diagnosis Date Noted   Acute ischemic stroke (HCC) 03/05/2023   Stroke-like symptom 03/04/2023   Research study patient 07/15/2022   Encounter for loop recorder check 01/02/2021   Loop Biotronik loop implant 12/16/2020 12/16/2020   Leukopenia    Controlled type 2 diabetes mellitus with hyperglycemia, with long-term current use of insulin (HCC)    Vascular headache    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   Sinus bradycardia on ECG 11/03/2020   Benign essential HTN    Pancytopenia (HCC)    Stage 3b chronic kidney disease (HCC)    Anemia    Labile blood glucose    Labile blood pressure    Diabetic peripheral neuropathy (HCC)    Essential hypertension    AKI (acute kidney  injury) (HCC)    Ataxia, late effect of cerebrovascular disease    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   Acute CVA (cerebrovascular accident) (HCC) 06/26/2020   CVA (cerebral vascular accident) (HCC) 06/25/2020   BMI 32.0-32.9,adult 06/03/2020   Left upper lobe pneumonia 03/30/2020   Chest pain 10/01/2019   MDD (major depressive disorder), recurrent severe, 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   Suicide attempt (HCC) 05/09/2019   Cocaine abuse (HCC) 05/09/2019   Interstitial lung disease (HCC) 06/21/2016   COPD (chronic obstructive pulmonary disease) (HCC) 06/21/2016   Cough 06/21/2016   GERD (gastroesophageal reflux disease) 06/21/2016   Hypersomnia 06/21/2016    Tobacco user 06/21/2016   Rectal cancer (HCC) 01/30/2013   PCP:  Fleet Contras, MD Pharmacy:   [facility] [phone] [place], [place] - [address] AT [facility] OF [address] & [facility] [address] [place] [address] Phone: [phone] Fax: [phone]  [facility] - [place], [place] - [address] [place] [address] Phone: [phone] Fax: [phone]  [id number] - [place], [place] - [address] AT [facility] [address] [place] [address] Phone: [phone] Fax: [phone]     Social Determinants of Health (SDOH) Social History: SDOH Screenings   Food Insecurity: No Food Insecurity (03/04/2023)  Recent Concern: Food Insecurity - Food Insecurity Present (02/01/2023)  Housing: Low Risk  (03/04/2023)  Recent Concern: Housing - High Risk (02/01/2023)  Transportation Needs: No Transportation Needs (03/04/2023)  Recent Concern: Transportation Needs - Unmet Transportation Needs (02/01/2023)  Utilities: Not At Risk (03/04/2023)  Recent Concern: Utilities - At Risk (02/01/2023)  Depression (PHQ2-9): Low Risk  (04/08/2022)  Social Connections: Unknown (07/25/2022)   [facility] from [facility]  Tobacco Use: Medium Risk (03/04/2023)   SDOH Interventions:     Readmission Risk Interventions    03/07/2023   10:54 AM  Readmission Risk Prevention Plan  Transportation Screening Complete  HRI or Home Care Consult Complete  Social Work Consult for Recovery Care Planning/Counseling Complete  Palliative Care Screening Complete  Medication Review Oceanographer) Complete

## 2023-03-08 ENCOUNTER — Inpatient Hospital Stay (HOSPITAL_COMMUNITY)
Admission: RE | Admit: 2023-03-08 | Discharge: 2023-03-15 | DRG: 057 | Disposition: A | Discharge status: Home Health | Payer: Medicare HMO | Source: Intra-hospital | Attending: Physical Medicine & Rehabilitation | Admitting: Physical Medicine & Rehabilitation

## 2023-03-08 ENCOUNTER — Other Ambulatory Visit: Payer: Self-pay

## 2023-03-08 ENCOUNTER — Encounter: Payer: Self-pay | Admitting: Internal Medicine

## 2023-03-08 ENCOUNTER — Encounter (HOSPITAL_COMMUNITY): Payer: Self-pay | Admitting: Physical Medicine & Rehabilitation

## 2023-03-08 DIAGNOSIS — R1310 Dysphagia, unspecified: Secondary | ICD-10-CM | POA: Diagnosis not present

## 2023-03-08 DIAGNOSIS — Z823 Family history of stroke: Secondary | ICD-10-CM

## 2023-03-08 DIAGNOSIS — I10 Essential (primary) hypertension: Secondary | ICD-10-CM | POA: Diagnosis not present

## 2023-03-08 DIAGNOSIS — Z801 Family history of malignant neoplasm of trachea, bronchus and lung: Secondary | ICD-10-CM | POA: Diagnosis not present

## 2023-03-08 DIAGNOSIS — J4489 Other specified chronic obstructive pulmonary disease: Secondary | ICD-10-CM | POA: Diagnosis present

## 2023-03-08 DIAGNOSIS — Z800 Family history of malignant neoplasm of digestive organs: Secondary | ICD-10-CM | POA: Diagnosis not present

## 2023-03-08 DIAGNOSIS — R0602 Shortness of breath: Secondary | ICD-10-CM

## 2023-03-08 DIAGNOSIS — G441 Vascular headache, not elsewhere classified: Secondary | ICD-10-CM | POA: Diagnosis not present

## 2023-03-08 DIAGNOSIS — Z85048 Personal history of other malignant neoplasm of rectum, rectosigmoid junction, and anus: Secondary | ICD-10-CM

## 2023-03-08 DIAGNOSIS — F419 Anxiety disorder, unspecified: Secondary | ICD-10-CM | POA: Diagnosis present

## 2023-03-08 DIAGNOSIS — K219 Gastro-esophageal reflux disease without esophagitis: Secondary | ICD-10-CM | POA: Diagnosis present

## 2023-03-08 DIAGNOSIS — F32A Depression, unspecified: Secondary | ICD-10-CM | POA: Diagnosis present

## 2023-03-08 DIAGNOSIS — N1832 Chronic kidney disease, stage 3b: Secondary | ICD-10-CM | POA: Diagnosis present

## 2023-03-08 DIAGNOSIS — Z794 Long term (current) use of insulin: Secondary | ICD-10-CM

## 2023-03-08 DIAGNOSIS — I639 Cerebral infarction, unspecified: Secondary | ICD-10-CM | POA: Diagnosis not present

## 2023-03-08 DIAGNOSIS — Z833 Family history of diabetes mellitus: Secondary | ICD-10-CM | POA: Diagnosis not present

## 2023-03-08 DIAGNOSIS — I63311 Cerebral infarction due to thrombosis of right middle cerebral artery: Secondary | ICD-10-CM | POA: Diagnosis not present

## 2023-03-08 DIAGNOSIS — M1990 Unspecified osteoarthritis, unspecified site: Secondary | ICD-10-CM | POA: Diagnosis present

## 2023-03-08 DIAGNOSIS — E1122 Type 2 diabetes mellitus with diabetic chronic kidney disease: Secondary | ICD-10-CM | POA: Diagnosis present

## 2023-03-08 DIAGNOSIS — Z880 Allergy status to penicillin: Secondary | ICD-10-CM

## 2023-03-08 DIAGNOSIS — I129 Hypertensive chronic kidney disease with stage 1 through stage 4 chronic kidney disease, or unspecified chronic kidney disease: Secondary | ICD-10-CM | POA: Diagnosis present

## 2023-03-08 DIAGNOSIS — Z87891 Personal history of nicotine dependence: Secondary | ICD-10-CM

## 2023-03-08 DIAGNOSIS — Z8042 Family history of malignant neoplasm of prostate: Secondary | ICD-10-CM | POA: Diagnosis not present

## 2023-03-08 DIAGNOSIS — N183 Chronic kidney disease, stage 3 unspecified: Secondary | ICD-10-CM

## 2023-03-08 DIAGNOSIS — F0390 Unspecified dementia without behavioral disturbance: Secondary | ICD-10-CM | POA: Diagnosis present

## 2023-03-08 DIAGNOSIS — Z79899 Other long term (current) drug therapy: Secondary | ICD-10-CM

## 2023-03-08 DIAGNOSIS — E1140 Type 2 diabetes mellitus with diabetic neuropathy, unspecified: Secondary | ICD-10-CM | POA: Diagnosis present

## 2023-03-08 DIAGNOSIS — I69354 Hemiplegia and hemiparesis following cerebral infarction affecting left non-dominant side: Secondary | ICD-10-CM

## 2023-03-08 DIAGNOSIS — Z8249 Family history of ischemic heart disease and other diseases of the circulatory system: Secondary | ICD-10-CM

## 2023-03-08 DIAGNOSIS — I69393 Ataxia following cerebral infarction: Secondary | ICD-10-CM | POA: Diagnosis present

## 2023-03-08 DIAGNOSIS — E1121 Type 2 diabetes mellitus with diabetic nephropathy: Secondary | ICD-10-CM | POA: Diagnosis not present

## 2023-03-08 DIAGNOSIS — D696 Thrombocytopenia, unspecified: Secondary | ICD-10-CM | POA: Diagnosis present

## 2023-03-08 DIAGNOSIS — Z7951 Long term (current) use of inhaled steroids: Secondary | ICD-10-CM

## 2023-03-08 DIAGNOSIS — Z23 Encounter for immunization: Secondary | ICD-10-CM | POA: Diagnosis present

## 2023-03-08 DIAGNOSIS — Z7982 Long term (current) use of aspirin: Secondary | ICD-10-CM

## 2023-03-08 DIAGNOSIS — Z7902 Long term (current) use of antithrombotics/antiplatelets: Secondary | ICD-10-CM

## 2023-03-08 DIAGNOSIS — I69319 Unspecified symptoms and signs involving cognitive functions following cerebral infarction: Secondary | ICD-10-CM | POA: Diagnosis not present

## 2023-03-08 LAB — GLUCOSE, CAPILLARY
Glucose-Capillary: 193 mg/dL — ABNORMAL HIGH (ref 70–99)
Glucose-Capillary: 148 mg/dL — ABNORMAL HIGH (ref 70–99)
Glucose-Capillary: 190 mg/dL — ABNORMAL HIGH (ref 70–99)
Glucose-Capillary: 209 mg/dL — ABNORMAL HIGH (ref 70–99)

## 2023-03-08 MED ORDER — PROCHLORPERAZINE EDISYLATE 10 MG/2ML IJ SOLN: 5.0000 mg | Freq: Four times a day (QID) | INTRAMUSCULAR | Status: DC | PRN

## 2023-03-08 MED ORDER — VITAMIN B-12 1000 MCG PO TABS
1000.0000 ug | ORAL_TABLET | Freq: Every day | ORAL | Status: DC
  Administered 2023-03-09 – 2023-03-15 (×7): 1000 ug via ORAL
  Filled 2023-03-08 (×7): qty 1

## 2023-03-08 MED ORDER — THIAMINE MONONITRATE 100 MG PO TABS
100.0000 mg | ORAL_TABLET | Freq: Every day | ORAL | Status: DC
  Administered 2023-03-09 – 2023-03-15 (×7): 100 mg via ORAL
  Filled 2023-03-08 (×7): qty 1

## 2023-03-08 MED ORDER — ROSUVASTATIN CALCIUM 20 MG PO TABS
40.0000 mg | ORAL_TABLET | Freq: Every day | ORAL | Status: DC
  Administered 2023-03-08 – 2023-03-14 (×7): 40 mg via ORAL
  Filled 2023-03-08 (×7): qty 2

## 2023-03-08 MED ORDER — CYANOCOBALAMIN 1000 MCG PO TABS: 1000.0000 ug | ORAL_TABLET | Freq: Every day | ORAL | 0 refills | Status: DC

## 2023-03-08 MED ORDER — ALUM & MAG HYDROXIDE-SIMETH 200-200-20 MG/5ML PO SUSP: 30.0000 mL | ORAL | Status: DC | PRN

## 2023-03-08 MED ORDER — VENLAFAXINE HCL ER 150 MG PO CP24
150.0000 mg | ORAL_CAPSULE | Freq: Every day | ORAL | Status: DC
  Administered 2023-03-09 – 2023-03-15 (×7): 150 mg via ORAL
  Filled 2023-03-08 (×7): qty 1

## 2023-03-08 MED ORDER — VITAMIN B-1 100 MG PO TABS: 100.0000 mg | ORAL_TABLET | Freq: Every day | ORAL | 0 refills | Status: DC

## 2023-03-08 MED ORDER — FLEET ENEMA RE ENEM: 1.0000 | ENEMA | Freq: Once | RECTAL | Status: DC | PRN

## 2023-03-08 MED ORDER — DICLOFENAC SODIUM 1 % EX GEL: 2.0000 g | Freq: Three times a day (TID) | CUTANEOUS | Status: DC | PRN

## 2023-03-08 MED ORDER — PROCHLORPERAZINE 25 MG RE SUPP: 12.5000 mg | Freq: Four times a day (QID) | RECTAL | Status: DC | PRN

## 2023-03-08 MED ORDER — BISACODYL 10 MG RE SUPP: 10.0000 mg | Freq: Every day | RECTAL | Status: DC | PRN

## 2023-03-08 MED ORDER — ENOXAPARIN SODIUM 40 MG/0.4ML IJ SOSY
40.0000 mg | PREFILLED_SYRINGE | INTRAMUSCULAR | Status: DC
  Administered 2023-03-08 – 2023-03-14 (×7): 40 mg via SUBCUTANEOUS
  Filled 2023-03-08 (×7): qty 0.4

## 2023-03-08 MED ORDER — INSULIN GLARGINE-YFGN 100 UNIT/ML ~~LOC~~ SOLN
20.0000 [IU] | Freq: Every day | SUBCUTANEOUS | Status: DC
Start: 2023-03-09 — End: 2023-03-08

## 2023-03-08 MED ORDER — DIPHENHYDRAMINE HCL 25 MG PO CAPS: 25.0000 mg | ORAL_CAPSULE | Freq: Four times a day (QID) | ORAL | Status: DC | PRN

## 2023-03-08 MED ORDER — ACETAMINOPHEN 325 MG PO TABS
325.0000 mg | ORAL_TABLET | ORAL | Status: DC | PRN
  Administered 2023-03-11: 650 mg via ORAL
  Filled 2023-03-08: qty 2

## 2023-03-08 MED ORDER — CLOPIDOGREL BISULFATE 75 MG PO TABS
75.0000 mg | ORAL_TABLET | Freq: Every day | ORAL | Status: DC
  Administered 2023-03-09 – 2023-03-15 (×7): 75 mg via ORAL
  Filled 2023-03-08 (×7): qty 1

## 2023-03-08 MED ORDER — INSULIN GLARGINE-YFGN 100 UNIT/ML ~~LOC~~ SOLN: 20.0000 [IU] | Freq: Every day | SUBCUTANEOUS | 11 refills | Status: DC

## 2023-03-08 MED ORDER — PIRFENIDONE 801 MG PO TABS
1.0000 | ORAL_TABLET | Freq: Three times a day (TID) | ORAL | Status: DC
  Administered 2023-03-08 – 2023-03-15 (×20): 801 mg via ORAL
  Filled 2023-03-08 (×25): qty 1

## 2023-03-08 MED ORDER — ALBUTEROL SULFATE (2.5 MG/3ML) 0.083% IN NEBU: 2.5000 mg | INHALATION_SOLUTION | RESPIRATORY_TRACT | Status: DC | PRN

## 2023-03-08 MED ORDER — AMLODIPINE BESYLATE 10 MG PO TABS
10.0000 mg | ORAL_TABLET | Freq: Every day | ORAL | Status: DC
  Administered 2023-03-09 – 2023-03-15 (×7): 10 mg via ORAL
  Filled 2023-03-08 (×7): qty 1

## 2023-03-08 MED ORDER — PROCHLORPERAZINE MALEATE 5 MG PO TABS: 5.0000 mg | ORAL_TABLET | Freq: Four times a day (QID) | ORAL | Status: DC | PRN

## 2023-03-08 MED ORDER — SERTRALINE HCL 50 MG PO TABS
50.0000 mg | ORAL_TABLET | Freq: Every day | ORAL | Status: DC
  Administered 2023-03-09 – 2023-03-15 (×7): 50 mg via ORAL
  Filled 2023-03-08 (×7): qty 1

## 2023-03-08 MED ORDER — INSULIN ASPART 100 UNIT/ML IJ SOLN
0.0000 [IU] | Freq: Every day | INTRAMUSCULAR | Status: DC
  Administered 2023-03-08 – 2023-03-09 (×2): 2 [IU] via SUBCUTANEOUS
  Administered 2023-03-10: 3 [IU] via SUBCUTANEOUS
  Administered 2023-03-14: 2 [IU] via SUBCUTANEOUS

## 2023-03-08 MED ORDER — MELATONIN 5 MG PO TABS: 5.0000 mg | ORAL_TABLET | Freq: Every evening | ORAL | Status: DC | PRN

## 2023-03-08 MED ORDER — GUAIFENESIN-DM 100-10 MG/5ML PO SYRP: 5.0000 mL | ORAL_SOLUTION | Freq: Four times a day (QID) | ORAL | Status: DC | PRN

## 2023-03-08 MED ORDER — SENNOSIDES-DOCUSATE SODIUM 8.6-50 MG PO TABS: 1.0000 | ORAL_TABLET | Freq: Every day | ORAL | 0 refills | Status: AC

## 2023-03-08 MED ORDER — ROSUVASTATIN CALCIUM 40 MG PO TABS: 40.0000 mg | ORAL_TABLET | Freq: Every day | ORAL | 1 refills | Status: DC

## 2023-03-08 MED ORDER — CLOPIDOGREL BISULFATE 75 MG PO TABS: 75.0000 mg | ORAL_TABLET | Freq: Every day | ORAL | 0 refills | Status: DC

## 2023-03-08 MED ORDER — PANTOPRAZOLE SODIUM 40 MG PO TBEC
40.0000 mg | DELAYED_RELEASE_TABLET | Freq: Every day | ORAL | Status: DC
  Administered 2023-03-09 – 2023-03-15 (×7): 40 mg via ORAL
  Filled 2023-03-08 (×7): qty 1

## 2023-03-08 MED ORDER — SENNOSIDES-DOCUSATE SODIUM 8.6-50 MG PO TABS: 1.0000 | ORAL_TABLET | Freq: Every evening | ORAL | Status: DC | PRN

## 2023-03-08 MED ORDER — HYDROCODONE-ACETAMINOPHEN 5-325 MG PO TABS: 1.0000 | ORAL_TABLET | Freq: Four times a day (QID) | ORAL | Status: DC | PRN

## 2023-03-08 MED ORDER — INSULIN ASPART 100 UNIT/ML IJ SOLN
0.0000 [IU] | Freq: Three times a day (TID) | INTRAMUSCULAR | Status: DC
  Administered 2023-03-08: 2 [IU] via SUBCUTANEOUS
  Administered 2023-03-09 (×2): 1 [IU] via SUBCUTANEOUS
  Administered 2023-03-09 – 2023-03-10 (×4): 2 [IU] via SUBCUTANEOUS
  Administered 2023-03-11: 3 [IU] via SUBCUTANEOUS
  Administered 2023-03-11 – 2023-03-12 (×4): 2 [IU] via SUBCUTANEOUS
  Administered 2023-03-12: 1 [IU] via SUBCUTANEOUS
  Administered 2023-03-13: 3 [IU] via SUBCUTANEOUS
  Administered 2023-03-14: 5 [IU] via SUBCUTANEOUS
  Administered 2023-03-14 – 2023-03-15 (×2): 1 [IU] via SUBCUTANEOUS
  Administered 2023-03-15: 2 [IU] via SUBCUTANEOUS

## 2023-03-08 MED ORDER — TRAZODONE HCL 50 MG PO TABS
100.0000 mg | ORAL_TABLET | Freq: Every day | ORAL | Status: DC
  Administered 2023-03-08 – 2023-03-14 (×7): 100 mg via ORAL
  Filled 2023-03-08 (×7): qty 2

## 2023-03-08 MED ORDER — GABAPENTIN 100 MG PO CAPS
100.0000 mg | ORAL_CAPSULE | Freq: Two times a day (BID) | ORAL | Status: DC
  Administered 2023-03-08 – 2023-03-15 (×14): 100 mg via ORAL
  Filled 2023-03-08 (×14): qty 1

## 2023-03-08 MED ORDER — INSULIN GLARGINE-YFGN 100 UNIT/ML ~~LOC~~ SOLN
21.0000 [IU] | Freq: Every day | SUBCUTANEOUS | Status: DC
  Administered 2023-03-09: 21 [IU] via SUBCUTANEOUS
  Filled 2023-03-08: qty 0.21

## 2023-03-08 MED ORDER — SACUBITRIL-VALSARTAN 49-51 MG PO TABS
1.0000 | ORAL_TABLET | Freq: Two times a day (BID) | ORAL | Status: DC
  Administered 2023-03-08 – 2023-03-15 (×14): 1 via ORAL
  Filled 2023-03-08 (×15): qty 1

## 2023-03-08 MED ORDER — ASPIRIN 81 MG PO TBEC
81.0000 mg | DELAYED_RELEASE_TABLET | Freq: Every day | ORAL | Status: DC
  Administered 2023-03-09 – 2023-03-15 (×7): 81 mg via ORAL
  Filled 2023-03-08 (×7): qty 1

## 2023-03-08 NOTE — H&P (Signed)
Physical Medicine and Rehabilitation Admission H&P    Chief Complaint  Patient presents with   Stroke with functional deficits    HPI:  Erica [name] is a 67 year old female with history of HTN, T2DM, depression, CVA with residual left sided deficits, rectal cancer, GERD, COPD/emphysema, pulmonary fibrosis with baseline dyspnea and chronic cough who was admitted to [facility] on 03/04/23 with  mental status changes and difficulty arousing patient from sleep. She was found to have small acute to subacute infarcts in bilateral centrumovale.  Dr. Iver Nestle evaluated patient and felt that stroke likely due to small vessel disease. 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 Carotid ultrasound without significant ICA stenosis. MRA without occlusion or significant stenosis.  2 D echo showed EF- 65-70/5 without wall abnormality. She was started on DAPT X 21 days to be followed by ASA alone--does have a loop recorder to rule out A fib.  Vitamin B/thiamine WNL.HIV, RPR, ANCA negative and mild elevation in ESR (normal CRP) not felt to be infectious etiology.   Patient with ataxic gait, fatigue, cognitive deficits and decrease control of LLE w/balance deficits. Therapy has been working with patient who requires min to mod assist with ADLs and mobility. CIR recommended due to functional decline. C/o neuropathy in feet and left hand.    Review of Systems  HENT:  Negative for hearing loss.   Eyes:  Positive for blurred vision (lost her glasses).  Respiratory:  Negative for cough and shortness of breath.   Cardiovascular:  Negative for chest pain and palpitations.  Gastrointestinal:  Negative for constipation and heartburn.  Musculoskeletal:  Positive for myalgias.  Neurological:  Positive for speech change and weakness.  Psychiatric/Behavioral:  The patient does not have insomnia.      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   GERD (gastroesophageal reflux disease)    with chronic cough   Headache(784.0)    "mild"   Hypertension    Loop Biotronik loop implant 12/16/2020 12/16/2020   Mental disorder    depression   Neuropathy    feet    Pain    arthritis pain - takes tramadol as needed   Pulmonary fibrosis, unspecified (HCC)    Rectal cancer (HCC)    Rectal polyp    very little bleeding with bowel movements- no pain   Stroke (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   Suicide attempt [facility])     Past Surgical History:  Procedure Laterality Date   ANAL RECTAL MANOMETRY N/A 07/13/2016   Procedure: ANO RECTAL MANOMETRY;  Surgeon: Romie Levee, MD;  Location: [facility] ENDOSCOPY;  Service: Endoscopy;  Laterality: N/A;   CESAREAN SECTION     EUS N/A 11/21/2012   Procedure: LOWER ENDOSCOPIC ULTRASOUND (EUS);  Surgeon: Willis Modena, MD;  Location: [facility];  Service: Endoscopy;  Laterality: N/A;   FLEXIBLE SIGMOIDOSCOPY N/A 11/21/2012   Procedure: FLEXIBLE SIGMOIDOSCOPY;  Surgeon: Willis Modena, MD;  Location: [facility] ENDOSCOPY;  Service: Endoscopy;  Laterality: N/A;   FLEXIBLE SIGMOIDOSCOPY N/A 01/28/2013   Procedure: FLEXIBLE SIGMOIDOSCOPY;  Surgeon: Romie Levee, MD;  Location: [facility] ENDOSCOPY;  Service: Endoscopy;  Laterality: N/A;   LAPAROSCOPIC LOW ANTERIOR RESECTION N/A 01/29/2013   Procedure: LAPAROSCOPIC LOW  ANTERIOR RESECTION, Rigid Proctoscopy;  Surgeon: Romie Levee, MD;  Location: [facility] ORS;  Service: General;  Laterality: N/A;   LAPAROSCOPIC SIGMOID COLECTOMY N/A 11/14/2012   Procedure: DIAGNOSTIC LAPAROSCOPY AND SIGMOIDMOIDOSCOPY ;  Surgeon: Emelia Loron, MD;  Location: [facility] ORS;  Service: General;  Laterality: N/A;   RECTAL ULTRASOUND N/A 07/13/2016   Procedure: RECTAL ULTRASOUND;  Surgeon: Romie Levee, MD;  Location: [facility]  ENDOSCOPY;  Service: Endoscopy;  Laterality: N/A;   TONSILLECTOMY     TONSILLECTOMY AND ADENOIDECTOMY      Family History  Problem Relation Age of Onset   Hypertension Father    Stroke Father    Diabetes Father    Heart disease Mother    Hypertension Mother    Hyperlipidemia Mother    Hypertension Sister    Hypertension Brother    Hypertension Sister    Hypertension Brother    Cancer Other 55       colon cancer    Cancer Maternal Aunt        cancer, unknown type    Cancer Maternal Uncle        bone cancer    Cancer Paternal Aunt        lung cancer   Cancer Paternal Uncle        lung cancer   Cancer Maternal Uncle        colon cancer and prostate cancer    Cancer Maternal Uncle        prostate cancer     Social History:  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 Has been staying with her sister. Needed assistance with medication management. She reports that she quit smoking about 5 years ago. Her smoking use included cigarettes. She started smoking about 25 years ago. She has a 10 pack-year smoking history. She has never used smokeless tobacco. She reports that she does not currently use alcohol. She reports that she does not currently use drugs after having used the following drugs: "Crack" cocaine.   Allergies  Allergen Reactions   Penicillins Hives, Itching and Swelling    Tongue swelling    Medications Prior to Admission  Medication Sig Dispense Refill   acetaminophen (TYLENOL) 325 MG tablet Take 2 tablets (650 mg total) by mouth every 4 (four) hours as needed for mild pain (or temp > 37.5 C (99.5 F)).     albuterol (VENTOLIN HFA) 108 (90 Base) MCG/ACT inhaler Inhale 2 puffs into the lungs every 4 (four) hours as needed for wheezing. 1 g 5   amLODipine (NORVASC) 10 MG tablet Take 1 tablet (10 mg total) by mouth daily. 30 tablet 2   aspirin EC 81 MG EC tablet Take 1 tablet (81 mg total) by mouth daily. Swallow whole. 30 tablet 11   [START ON  03/09/2023] clopidogrel (PLAVIX) 75 MG tablet Take 1 tablet (75 mg total) by mouth daily for 19 days. 19 tablet 0   [START ON 03/09/2023] cyanocobalamin 1000 MCG tablet Take 1 tablet (1,000 mcg total) by mouth daily. 30 tablet 0   diclofenac Sodium (VOLTAREN) 1 % GEL SMARTSIG:4 Gram(s) Topical 4 Times Daily PRN     ENTRESTO 49-51 MG TAKE 1 TABLET BY MOUTH TWICE DAILY 180 tablet 1   famotidine (PEPCID) 20 MG tablet Take 1 tablet (20 mg total) by mouth daily. 30 tablet 0   fenofibrate (TRICOR) 145 MG tablet TAKE 1 TABLET(145 MG) BY MOUTH DAILY (Patient taking differently: Take 145 mg by mouth daily.) 90 tablet 3  furosemide (LASIX) 20 MG tablet Take 1 tablet (20 mg total) by mouth daily. (Patient not taking: Reported on 03/04/2023) 4 tablet 0   gabapentin (NEURONTIN) 100 MG capsule Take 2 capsules (200 mg total) by mouth 2 (two) times daily as needed (pain). (Patient taking differently: Take 100 mg by mouth 2 (two) times daily. Patient is taking as needed) 60 capsule 0   HYDROcodone-acetaminophen (NORCO/VICODIN) 5-325 MG tablet Take 1 tablet by mouth every 6 (six) hours as needed.     [START ON 03/09/2023] insulin glargine-yfgn (SEMGLEE) 100 UNIT/ML injection Inject 0.2 mLs (20 Units total) into the skin daily. 10 mL 11   Insulin Pen Needle (PEN NEEDLES) 32G X 6 MM MISC 1 application by Does not apply route 2 (two) times daily. 100 each 1   pantoprazole (PROTONIX) 40 MG tablet Take 40 mg by mouth daily.     Pirfenidone 801 MG TABS TAKE 1 TABLET (801 MG) BY MOUTH WITH BREAKFAST, WITH LUNCH, AND WITH EVENING MEAL. (Patient taking differently: Take 1 tablet by mouth daily. Patient is taking 1 tablet daily current with breakfast) 270 tablet 0   rosuvastatin (CRESTOR) 40 MG tablet Take 1 tablet (40 mg total) by mouth daily. 30 tablet 1   senna-docusate (SENOKOT-S) 8.6-50 MG tablet Take 1 tablet by mouth at bedtime. 30 tablet 0   sertraline (ZOLOFT) 50 MG tablet Take 50 mg by mouth daily.     SYMBICORT 160-4.5  MCG/ACT inhaler INHALE 2 PUFFS INTO THE LUNGS TWICE DAILY (Patient not taking: Reported on 03/04/2023) 10.2 g 5   [START ON 03/09/2023] thiamine (VITAMIN B-1) 100 MG tablet Take 1 tablet (100 mg total) by mouth daily. 30 tablet 0   tiZANidine (ZANAFLEX) 4 MG tablet Take 4 mg by mouth 2 (two) times daily as needed.     traZODone (DESYREL) 100 MG tablet Take 1 tablet by mouth daily.     triamcinolone cream (KENALOG) 0.1 % Apply topically 2 (two) times daily as needed.     venlafaxine XR (EFFEXOR-XR) 150 MG 24 hr capsule Take 150 mg by mouth daily.     Home: Home Living Family/patient expects to be discharged to:: Private residence Living Arrangements: Other relatives (sister) Available Help at Discharge: Family, Available 24 hours/day Type of Home: House Home Access: Level entry Home Layout: One level (one step from kitchen to another room) Bathroom Shower/Tub: Tub only (sister will take her pt's niece's house which is next door to take a shower) Bathroom Toilet: Handicapped height Bathroom Accessibility: Yes Home Equipment: Cane - single point, Shower seat  Lives With: Family (sister)   Functional History: Prior Function Prior Level of Function : Independent/Modified Independent Mobility Comments: ambulates with use of a cane ADLs Comments: Ind with self care tasks and sister assist with IADLs as needed   Functional Status:  Mobility: Bed Mobility Overal bed mobility: Needs Assistance Bed Mobility: Supine to Sit, Sit to Supine Supine to sit: Supervision Sit to supine: Supervision General bed mobility comments: intermittent vcs for technique Transfers Overall transfer level: Needs assistance Equipment used: 1 person hand held assist, Rolling walker (2 wheels) Transfers: Sit to/from Stand Sit to Stand: Min assist, Contact guard assist (MIN A for STS with HHA, MOD A for lateral steps up the bed; CGA for STS with RW) General transfer comment: Increased time to complete task and  sequencing Ambulation/Gait Ambulation/Gait assistance: Contact guard assist, Min assist Gait Distance (Feet): 80 Feet Assistive device: Straight cane Gait Pattern/deviations: Step-to pattern, Ataxic, Drifts right/left, Narrow  base of support, Decreased dorsiflexion - left General Gait Details: Remains a high fall risk due to decreased strength and motor control L LE. Increased balance with use of RW Gait velocity: decr   ADL: ADL Overall ADL's : Needs assistance/impaired Grooming: Wash/dry hands, Standing, Contact guard assist Lower Body Dressing: Minimal assistance, Sit to/from stand Lower Body Dressing Details (indicate cue type and reason): pants Toilet Transfer: Ambulation, Rolling walker (2 wheels), Contact guard assist Toileting- Clothing Manipulation and Hygiene: Contact guard assist, Sitting/lateral lean Functional mobility during ADLs: Contact guard assist, Minimal assistance, Rolling walker (2 wheels) (approx 15' two attempts; frequent vcs for technique, L foot drop noted) General ADL Comments: MOD A + HHA for toilet t/f, improves to MIN A with use of RW, asssit for RW mgmt. CGA hand washing standing sink side. MIN A don B shoes in sitting, assist to tie shoe laces   Cognition: Cognition Overall Cognitive Status: No family/caregiver present to determine baseline cognitive functioning Arousal/Alertness: Awake/alert Orientation Level: Oriented X4 Comments: deferred due to increased lethargy Cognition Arousal: Alert Behavior During Therapy: WFL for tasks assessed/performed, Flat affect Overall Cognitive Status: No family/caregiver present to determine baseline cognitive functioning Area of Impairment: Memory, Problem solving Memory: Decreased short-term memory Problem Solving: Slow processing, Requires verbal cues General Comments: Pleasant and cooperative  Blood pressure (!) 147/77, pulse 65, temperature 98.6 F (37 C), resp. rate 16, height 5\' 3"  (1.6 m), weight 73.7 kg,  SpO2 100%.  Physical Exam Vitals reviewed.  Gen: no distress, normal appearing HEENT: oral mucosa pink and moist, NCAT Cardio: Reg rate Chest: normal effort, normal rate of breathing Abd: soft, non-distended Ext: no edema Psych: pleasant, normal affect Skin:    General: Skin is warm and dry.  Neurological:     Mental Status: She is alert and oriented to person, place, and time.     Comments: Able to answer basic orientation questions and follow simple motor commands. Speech halting and slow. Mild left sided weakness with LUE>RUE ataxia. Glove on left hand for chronic neuropathy since prior stroke.  Results for orders placed or performed during the hospital encounter of 03/08/23 (from the past 48 hour(s))  Glucose, capillary     Status: Abnormal   Collection Time: 03/08/23  4:40 PM  Result Value Ref Range   Glucose-Capillary 190 (H) 70 - 99 mg/dL    Comment: Glucose reference range applies only to samples taken after fasting for at least 8 hours.   No results found.    Blood pressure (!) 147/77, pulse 65, temperature 98.6 F (37 C), resp. rate 16, height 5\' 3"  (1.6 m), weight 73.7 kg, SpO2 100%.  Medical Problem List and Plan: 1. Functional deficits secondary to acute infarcts of bilateral centrum semiovale.   -patient may shower  -ELOS/Goals: 5-7 days  Admit to CIR 2.  Antithrombotics: -DVT/anticoagulation:  Pharmaceutical: Lovenox  -antiplatelet therapy: DAPT X 3 weeks followed by ASA alont.  3. Peripheral neuropathy:  tylenol prn. Discussed Qutenza outpatient and she would like to try 4. Mood/Behavior/Sleep: LCSW to follow for evaluation and support  --melatonin prn for insomnia.   -antipsychotic agents: N/A 5. Neuropsych/cognition: This patient is not fully capable of making decisions on her own behalf. 6. Skin/Wound Care: Routine pressure relief measures.  7. Fluids/Electrolytes/Nutrition: Monitor I/O. Check CMET in am. 8. HTN: Monitor BP TID. Continue amlodipine 10  mg daily and Enetresto 9. T2DM: Hgb A1C-6.9.  Was on lantus 24 u am/18 u pm  --Increase glargine to 22U, recommended to  avoid drinks with added sugar.  10 H/o COPD/IPF: Per records has chronic DOE/cough.  --on Pirfenidone TID. No MDI-->followed by Dr. Marchelle Gearing 11. GERD: On protonix  12. H/o Depression/anxiety/SA: Continue Effexor and Zoloft. Check magnesium level tomorrow.   13. Screening for vitamin D deficiency: check vitamin D level tomorrow.     I have personally performed a face to face diagnostic evaluation, including, but not limited to relevant history and physical exam findings, of this patient and developed relevant assessment and plan.  Additionally, I have reviewed and concur with the physician assistant's documentation above.  Jacquelynn Cree, PA-C   Horton Chin, MD 03/08/2023

## 2023-03-08 NOTE — Plan of Care (Signed)
  Problem: Education: Goal: Knowledge of disease or condition will improve Outcome: Progressing Goal: Knowledge of secondary prevention will improve (MUST DOCUMENT ALL) Outcome: Progressing Goal: Knowledge of patient specific risk factors will improve Loraine Leriche N/A or DELETE if not current risk factor) Outcome: Progressing   Problem: Ischemic Stroke/TIA Tissue Perfusion: Goal: Complications of ischemic stroke/TIA will be minimized Outcome: Progressing   Problem: Coping: Goal: Will verbalize positive feelings about self Outcome: Progressing Goal: Will identify appropriate support needs Outcome: Progressing   Problem: Health Behavior/Discharge Planning: Goal: Ability to manage health-related needs will improve Outcome: Progressing Goal: Goals will be collaboratively established with patient/family Outcome: Progressing   Problem: Self-Care: Goal: Ability to participate in self-care as condition permits will improve Outcome: Progressing Goal: Verbalization of feelings and concerns over difficulty with self-care will improve Outcome: Progressing Goal: Ability to communicate needs accurately will improve Outcome: Progressing   Problem: Nutrition: Goal: Risk of aspiration will decrease Outcome: Progressing Goal: Dietary intake will improve Outcome: Progressing   Problem: Education: Goal: Knowledge of General Education information will improve Description: Including pain rating scale, medication(s)/side effects and non-pharmacologic comfort measures Outcome: Progressing   Problem: Health Behavior/Discharge Planning: Goal: Ability to manage health-related needs will improve Outcome: Progressing   Problem: Clinical Measurements: Goal: Ability to maintain clinical measurements within normal limits will improve Outcome: Progressing Goal: Will remain free from infection Outcome: Progressing Goal: Diagnostic test results will improve Outcome: Progressing Goal: Respiratory  complications will improve Outcome: Progressing Goal: Cardiovascular complication will be avoided Outcome: Progressing   Problem: Activity: Goal: Risk for activity intolerance will decrease Outcome: Progressing   Problem: Nutrition: Goal: Adequate nutrition will be maintained Outcome: Progressing   Problem: Coping: Goal: Level of anxiety will decrease Outcome: Progressing   Problem: Elimination: Goal: Will not experience complications related to bowel motility Outcome: Progressing Goal: Will not experience complications related to urinary retention Outcome: Progressing   Problem: Pain Managment: Goal: General experience of comfort will improve Outcome: Progressing   Problem: Safety: Goal: Ability to remain free from injury will improve Outcome: Progressing   Problem: Skin Integrity: Goal: Risk for impaired skin integrity will decrease Outcome: Progressing   Problem: Education: Goal: Ability to describe self-care measures that may prevent or decrease complications (Diabetes Survival Skills Education) will improve Outcome: Progressing Goal: Individualized Educational Video(s) Outcome: Progressing   Problem: Coping: Goal: Ability to adjust to condition or change in health will improve Outcome: Progressing   Problem: Fluid Volume: Goal: Ability to maintain a balanced intake and output will improve Outcome: Progressing   Problem: Health Behavior/Discharge Planning: Goal: Ability to identify and utilize available resources and services will improve Outcome: Progressing Goal: Ability to manage health-related needs will improve Outcome: Progressing   Problem: Metabolic: Goal: Ability to maintain appropriate glucose levels will improve Outcome: Progressing   Problem: Nutritional: Goal: Maintenance of adequate nutrition will improve Outcome: Progressing Goal: Progress toward achieving an optimal weight will improve Outcome: Progressing   Problem: Skin  Integrity: Goal: Risk for impaired skin integrity will decrease Outcome: Progressing   Problem: Tissue Perfusion: Goal: Adequacy of tissue perfusion will improve Outcome: Progressing

## 2023-03-08 NOTE — Progress Notes (Signed)
PMR Admission Coordinator Pre-Admission Assessment   Patient: Erica [name] is an 67 y.o., female MRN: [id number] DOB: 10/07/1955 Height: 5\' 3"  (160 cm) Weight: 75.7 kg   Insurance Information HMO: yes    PPO:      PCP:      IPA:      80/20:      OTHER:  PRIMARY: [employer]      Policy#: [id number]      Subscriber: patient CM Name: Danella Penton       Phone#: [phone]     Fax#: [phone] Received faxed auth from Fransico Setters on 03/08/23 with Berkley Harvey for admit 6/38-75/6 Pre-Cert#: [id number]      Employer:  Benefits:  Phone #: n/a-online at [email or web address]     Name:  Eff. Date: 06/13/22     Deduct: $240 ($240 met)      Out of Pocket Max: $8,850 ($1,109.06 met)      Life Max: NA CIR: $2,080 co-pay/admission      SNF: 100% for days 1-20, $203/day co-pay for days 21-100 Outpatient: 80% coverage     Co-Pay: 20% co-insurance Home Health: 100% coverage      Co-Pay:  DME: 80% coverage     Co-Pay: 20% co-insurance Providers: in-network SECONDARY: Medicaid [place] [facility]      Policy#: [id number]     Phone#: [phone]   Financial Counselor:       Phone#:    The "Data Collection Information Summary" for patients in Inpatient Rehabilitation Facilities with attached "Privacy Act Statement-Health Care Records" was provided and verbally reviewed with: Patient   Emergency Contact Information Contact Information       Name Relation Home Work [place] Son [phone]             Other Contacts       Name Relation Home Work Mobile    Boykin Sister [phone]        Jeffries,Crystal Niece     [phone]           Current Medical History  Patient Admitting Diagnosis: CVA History of Present Illness: Pt is a 67 year old female with medical hx significant for: prior CVA with residual left-sided weakness, IDDM, depression, anxiety, hyperlipidemia, GERD. Pt presented to [facility] on 03/04/23 d/t AMS. CT negative for acute  abnormalities. MRI showed small acute to subacute infarcts in bilateral centrum semiovale without hemorrhage or mass effect. Therapy evaluations completed and CIR recommended d/t pt's deficits in functional mobility.  Complete NIHSS TOTAL: 2   Patient's medical record from [facility] has been reviewed by the rehabilitation admission coordinator and physician.   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 No   Family History   family history includes Cancer in her maternal aunt, maternal uncle, maternal uncle, maternal uncle, paternal aunt, and paternal uncle; Cancer (age of onset: 10) in an other family member; Diabetes in her father; Heart disease in her mother; Hyperlipidemia in her mother; Hypertension in her brother, brother, father, mother, sister, and sister; Stroke in her father.   Current Medications  Current Medications    Current Facility-Administered Medications:    acetaminophen (TYLENOL) tablet 650 mg, 650 mg, Oral, Q4H PRN, 650 mg at 03/05/23 1203 **OR** acetaminophen (TYLENOL) 160 MG/5ML solution 650 mg, 650 mg, Per Tube, Q4H PRN **OR** acetaminophen (TYLENOL) suppository 650 mg, 650 mg, Rectal, Q4H PRN, Cox, Amy N, DO   albuterol (PROVENTIL) (2.5 MG/3ML) 0.083% nebulizer solution 2.5 mg, 2.5 mg, Inhalation, Q4H PRN, Cox, Amy N, DO   amLODipine  (NORVASC) tablet 10 mg, 10 mg, Oral, Daily, Cox, Amy N, DO, 10 mg at 03/05/23 1610   aspirin EC tablet 81 mg, 81 mg, Oral, Daily, Cox, Amy N, DO, 81 mg at 03/05/23 9604   ciprofloxacin (CIPRO) tablet 250 mg, 250 mg, Oral, BID, Cox, Amy N, DO, 250 mg at 03/05/23 0927   [START ON 03/06/2023] cyanocobalamin (VITAMIN B12) tablet 1,000 mcg, 1,000 mcg, Oral, Daily, Bhagat, Srishti L, MD   diclofenac Sodium (VOLTAREN) 1 % topical gel 2 g, 2 g, Topical, TID PRN, Cox, Amy N, DO   [START ON 03/06/2023] enoxaparin (LOVENOX) injection 40 mg, 40 mg, Subcutaneous, QHS, Bhagat, Srishti L, MD   gabapentin (NEURONTIN) capsule 100 mg, 100 mg, Oral, BID, Cox, Amy N, DO, 100 mg at 03/05/23 5409   HYDROcodone-acetaminophen (NORCO/VICODIN) 5-325 MG per tablet 1 tablet, 1 tablet, Oral, Q6H PRN, Cox, Amy N, DO   insulin aspart (novoLOG) injection 0-5 Units, 0-5 Units, Subcutaneous, QHS, Cox, Amy N, DO, 2 Units at 03/04/23 2107   insulin aspart (novoLOG) injection 0-9 Units, 0-9 Units, Subcutaneous, TID WC, Cox, Amy N, DO   pantoprazole (PROTONIX) EC tablet 40 mg, 40 mg, Oral, Daily, Cox, Amy N, DO, 40 mg at 03/05/23 8119   Pirfenidone TABS 801 mg, 1 tablet, Oral, TID with meals, Cox, Amy N, DO, 801 mg at 03/05/23 1246   rosuvastatin (CRESTOR) tablet 20 mg, 20 mg, Oral, QHS, Cox, Amy N, DO, 20 mg at 03/04/23 2106   sacubitril-valsartan (ENTRESTO) 49-51 mg per tablet, 1 tablet, Oral, BID, Cox, Amy N, DO, 1 tablet at 03/05/23 0930   senna-docusate (Senokot-S) tablet 1 tablet, 1 tablet, Oral, QHS PRN, Cox, Amy N, DO   sertraline (ZOLOFT) tablet 50 mg, 50 mg, Oral, Daily, Cox, Amy N, DO, 50 mg at 03/05/23 0927   [START ON 03/06/2023] thiamine (VITAMIN B1) 500 mg in sodium chloride 0.9 % 50 mL IVPB, 500 mg, Intravenous, Daily, Bhagat, Srishti L, MD   traZODone (DESYREL) tablet 100 mg, 100 mg, Oral, QHS, Cox, Amy N, DO, 100 mg at 03/04/23 2107   venlafaxine XR (EFFEXOR-XR) 24 hr capsule 150 mg, 150 mg, Oral, Daily, Cox, Amy N, DO,  150 mg at 03/05/23 1478     Patients Current Diet:  Diet Order                  Diet heart healthy/carb modified Fluid consistency: Thin  Diet effective now  Precautions / Restrictions Precautions Precautions: Fall Restrictions Weight Bearing Restrictions: No    Has the patient had 2 or more falls or a fall with injury in the past year? No   Prior Activity Level Community (5-7x/wk): gets out of house often   Prior Functional Level Self Care: Did the patient need help bathing, dressing, using the toilet or eating? Independent   Indoor Mobility: Did the patient need assistance with walking from room to room (with or without device)? Independent   Stairs: Did the patient need assistance with internal or external stairs (with or without device)? Independent   Functional Cognition: Did the patient need help planning regular tasks such as shopping or remembering to take medications? Independent   Patient Information Are you of Hispanic, Latino/a,or Spanish origin?: A. No, not of Hispanic, Latino/a, or Spanish origin What is your race?: B. Black or African American Do you need or want an interpreter to communicate with a doctor or health care staff?: 0. No   Patient's Response To:  Health Literacy and Transportation Is the patient able to respond to health literacy and transportation needs?: Yes Health Literacy - How often do you need to have someone help you when you read instructions, pamphlets, or other written material from your doctor or pharmacy?: Never In the past 12 months, has lack of transportation kept you from medical appointments or from getting medications?: No In the past 12 months, has lack of transportation kept you from meetings, work, or from getting things needed for daily living?: No   Home Assistive Devices / Equipment Home Assistive Devices/Equipment: Medical laboratory scientific officer (specify quad or straight) Home Equipment: Cane - single point, Shower  seat   Prior Device Use: Indicate devices/aids used by the patient prior to current illness, exacerbation or injury? Walker and cane   Current Functional Level Cognition   Arousal/Alertness: Awake/alert Overall Cognitive Status: Impaired/Different from baseline Orientation Level: Oriented X4 General Comments: Pleasant and cooperative Comments: deferred due to increased lethargy    Extremity Assessment (includes Sensation/Coordination)   Upper Extremity Assessment: Generalized weakness, LUE deficits/detail LUE Deficits / Details: decreased coordination and dexterity but also had prior CVA with residual deficits on L side  Lower Extremity Assessment: Generalized weakness, Difficult to assess due to impaired cognition     ADLs   Overall ADL's : Needs assistance/impaired Grooming: Wash/dry hands, Wash/dry face, Standing, Minimal assistance Toilet Transfer: Moderate assistance, Ambulation, Rolling walker (2 wheels)     Mobility   Overal bed mobility: Needs Assistance Bed Mobility: Supine to Sit, Sit to Supine Supine to sit: Supervision Sit to supine: Supervision General bed mobility comments: increased time and effort to complete     Transfers   Overall transfer level: Needs assistance Equipment used: 1 person hand held assist Transfers: Sit to/from Stand Sit to Stand: Mod assist General transfer comment: pt with slow, steady transitional movement into standing from EOB x2 with appropriate hand placement adn technique utilized with CGA for safety     Ambulation / Gait / Stairs / Wheelchair Mobility   Ambulation/Gait Ambulation/Gait assistance: Contact guard assist Assistive device: Rolling walker (2 wheels) General Gait Details: pt rapidly fatiguing in standing; she was able to take a few side steps bilaterally at EOB but unable to ambulate any further secondary to significant fatigue. Gait velocity: decr     Posture / Balance Balance Overall balance assessment: Needs  assistance Sitting-balance support: Feet supported Sitting balance-Leahy Scale: Fair Standing balance support: Bilateral upper extremity supported, Reliant on assistive device for  balance, During functional activity Standing balance-Leahy Scale: Poor     Special needs/care consideration Diabetic management Novolog 0-5 units daily at bedtime; Novolog 0-9 units 3x daily with meals    Previous Home Environment (from acute therapy documentation) Living Arrangements: Other relatives (sister)  Lives With: Family (sister) Available Help at Discharge: Family, Available 24 hours/day Type of Home: House Home Layout: One level (one step from kitchen to another room) Home Access: Level entry Bathroom Shower/Tub: Tub only (sister will take her pt's niece's house which is next door to take a shower) Bathroom Toilet: Handicapped height Bathroom Accessibility: Yes How Accessible: Accessible via walker Home Care Services: Yes Type of Home Care Services: Homehealth aide (comes 3 days/week)   Discharge Living Setting Plans for Discharge Living Setting: House (sister's house) Type of Home at Discharge: House Discharge Home Layout: One level (one step from kitchen to another room) Discharge Home Access: Level entry Discharge Bathroom Shower/Tub: Tub only (sister will take pt to neice's house next door to take a shower) Discharge Bathroom Toilet: Handicapped height Discharge Bathroom Accessibility: Yes How Accessible: Accessible via walker Does the patient have any problems obtaining your medications?: No   Social/Family/Support Systems Anticipated Caregiver: [name], sister Anticipated Caregiver's Contact Information: [phone] Caregiver Availability: 24/7 Discharge Plan Discussed with Primary Caregiver: Yes Is Caregiver In Agreement with Plan?: Yes Does Caregiver/Family have Issues with Lodging/Transportation while Pt is in Rehab?: No   Goals Patient/Family Goal for Rehab: PT/OT/SLP  Supervision  Expected length of stay: 7-10 days  Pt/Family Agrees to Admission and willing to participate: Yes Program Orientation Provided & Reviewed with Pt/Caregiver Including Roles  & Responsibilities: Yes   Decrease burden of Care through IP rehab admission: NA   Possible need for SNF placement upon discharge: Not anticipated   Patient Condition: I have reviewed medical records from [facility], spoken with CSW, and patient and family member. I discussed via phone for inpatient rehabilitation assessment.  Patient will benefit from ongoing PT, OT, and SLP, can actively participate in 3 hours of therapy a day 5 days of the week, and can make measurable gains during the admission.  Patient will also benefit from the coordinated team approach during an Inpatient Acute Rehabilitation admission.  The patient will receive intensive therapy as well as Rehabilitation physician, nursing, social worker, and care management interventions.  Due to safety, disease management, medication administration, pain management, and patient education the patient requires 24 hour a day rehabilitation nursing.  The patient is currently min A  with mobility and basic ADLs.  Discharge setting and therapy post discharge at home with home health is anticipated.  Patient has agreed to participate in the Acute Inpatient Rehabilitation Program and will admit today.   Preadmission Screen Completed By:  Domingo Pulse, 03/05/2023 3:33 PM ______________________________________________________________________   Discussed status with Dr. Carlis Abbott  on 03/08/23 at 930 and received approval for admission today.   Admission Coordinator:  Domingo Pulse, CCC-SLP, time 1042/Date 03/08/23    Assessment/Plan: Diagnosis: Acute infarcts of bilateral centrum semiovale Does the need for close, 24 hr/day Medical supervision in concert with the patient's rehab needs make it unreasonable for this patient to be  served in a less intensive setting? Yes Co-Morbidities requiring supervision/potential complications: overweight, DM, anxiety, depression, GERD Due to bladder management, bowel management, safety, skin/wound care, disease management, medication administration, pain management, and patient education, does the patient require 24 hr/day rehab nursing? Yes Does the patient require coordinated care of  a physician, rehab nurse, PT, OT, and SLP to address physical and functional deficits in the context of the above medical diagnosis(es)? Yes Addressing deficits in the following areas: balance, endurance, locomotion, strength, transferring, bowel/bladder control, bathing, dressing, feeding, grooming, toileting, cognition, and psychosocial support Can the patient actively participate in an intensive therapy program of at least 3 hrs of therapy 5 days a week? Yes The potential for patient to make measurable gains while on inpatient rehab is excellent Anticipated functional outcomes upon discharge from inpatient rehab: supervision PT, supervision OT, supervision SLP Estimated rehab length of stay to reach the above functional goals is:  Anticipated discharge destination: Home 10. Overall Rehab/Functional Prognosis: excellent     MD Signature: Sula Soda, MD

## 2023-03-08 NOTE — TOC Transition Note (Signed)
Transition of Care [facility]) - CM/SW Discharge Note   Patient Details  Name: Erica [name] MRN: [id number] Date of Birth: [birth year]  Transition [facility] CM/SW Contact:  Allena Katz, LCSW Phone Number: 03/08/2023, 10:47 AM   Clinical Narrative:   [facility] CIR to call for transport. Medical necessity printed to unit.     Final next level of care: IP Rehab Facility Barriers to Discharge: Continued Medical Work up   Patient Goals and CMS Choice CMS Medicare.gov Compare Post Acute Care list provided to:: Patient    Discharge Placement                  Patient to be transferred to facility by: [facility]      Discharge Plan and Services Additional resources added to the After Visit Summary for                                       Social Determinants of Health (SDOH) Interventions SDOH Screenings   Food Insecurity: No Food Insecurity (03/04/2023)  Recent Concern: Food Insecurity - Food Insecurity Present (02/01/2023)  Housing: Low Risk  (03/04/2023)  Recent Concern: Housing - High Risk (02/01/2023)  Transportation Needs: No Transportation Needs (03/04/2023)  Recent Concern: Transportation Needs - Unmet Transportation Needs (02/01/2023)  Utilities: Not At Risk (03/04/2023)  Recent Concern: Utilities - At Risk (02/01/2023)  Depression (PHQ2-9): Low Risk  (04/08/2022)  Social Connections: Unknown (07/25/2022)   [facility] from [facility]  Tobacco Use: Medium Risk (03/04/2023)     Readmission Risk Interventions    03/07/2023   10:54 AM  Readmission Risk Prevention Plan  Transportation Screening Complete  HRI or Home Care Consult Complete  Social Work Consult for Recovery Care Planning/Counseling Complete  Palliative Care Screening Complete  Medication Review Oceanographer) Complete

## 2023-03-08 NOTE — Progress Notes (Signed)
Occupational Therapy Treatment Patient Details Name: Erica [name] MRN: [id number] DOB: [birth year] Today's Date: 03/08/2023   History of present illness Pt is a 67 y/o female admitted secondary to AMS, R facial droop and communication difficulties. MRI of the brain revealed small acute/subacute infarcts of the bilateral centrum semiovale without hemorrhage or mass effect. PMH including but not limited to CVA, DM, anxiety, depression, GERD.   OT comments  Chart reviewed to date, pt greeted in room, requesting to use the restroom. Tx session targeted improving safe functional mobility to improve independence during toilet transfers, ADLs requiring dynamic standing balance. Improvements noted with pt requiring CGA with RW for amb to bathroom and toilet transfer, toileting completed with CGA. Lateral steps up the bed performed with MIN A with HHA. Pt recalled 1/3 HEP provided to her from OT in previous session with good demo after review. Pt is making progress towards goals however continues to perform ADL/functional mobility below baseline, discharge recommendation remains appropriate.       If plan is discharge home, recommend the following:  A lot of help with walking and/or transfers;A little help with bathing/dressing/bathroom;Assistance with cooking/housework;Assist for transportation;Help with stairs or ramp for entrance   Equipment Recommendations  Other (comment) (defer to next venue)    Recommendations for Other Services      Precautions / Restrictions Precautions Precautions: Fall Restrictions Weight Bearing Restrictions: No       Mobility Bed Mobility Overal bed mobility: Needs Assistance Bed Mobility: Supine to Sit, Sit to Supine     Supine to sit: Supervision Sit to supine: Supervision   General bed mobility comments: intermittent vcs for technique    Transfers Overall transfer level: Needs assistance Equipment used: 1 person hand held assist, Rolling walker (2  wheels) Transfers: Sit to/from Stand Sit to Stand: Min assist, Contact guard assist (MIN A for STS with HHA, MOD A for lateral steps up the bed; CGA for STS with RW)                 Balance Overall balance assessment: Needs assistance Sitting-balance support: Feet supported Sitting balance-Leahy Scale: Fair     Standing balance support: No upper extremity supported, During functional activity Standing balance-Leahy Scale: Fair                             ADL either performed or assessed with clinical judgement   ADL Overall ADL's : Needs assistance/impaired     Grooming: Wash/dry hands;Standing;Contact guard assist               Lower Body Dressing: Minimal assistance;Sit to/from stand   Toilet Transfer: Ambulation;Rolling walker (2 wheels);Contact guard assist   Toileting- Clothing Manipulation and Hygiene: Contact guard assist;Sitting/lateral lean       Functional mobility during ADLs: Contact guard assist;Minimal assistance;Rolling walker (2 wheels) (approx 15' two attempts; frequent vcs for technique, L foot drop noted)      Extremity/Trunk Assessment              Vision       Perception     Praxis      Cognition Arousal: Alert Behavior During Therapy: WFL for tasks assessed/performed, Flat affect Overall Cognitive Status: No family/caregiver present to determine baseline cognitive functioning Area of Impairment: Memory, Problem solving                     Memory: Decreased short-term memory  Problem Solving: Slow processing, Requires verbal cues          Exercises Other Exercises Other Exercises: review standing LUE exercises: pt recall 1/3 (composite finger adduction/abduction); demos isolated digit tapping, isolated digit flexion/extension with demo    Shoulder Instructions       General Comments vss throughout    Pertinent Vitals/ Pain       Pain Assessment Pain Assessment: No/denies pain  Home  Living                                          Prior Functioning/Environment              Frequency  Min 1X/week        Progress Toward Goals  OT Goals(current goals can now be found in the care plan section)  Progress towards OT goals: Progressing toward goals     Plan      Co-evaluation                 AM-PAC OT "6 Clicks" Daily Activity     Outcome Measure   Help from another person eating meals?: None Help from another person taking care of personal grooming?: A Little Help from another person toileting, which includes using toliet, bedpan, or urinal?: A Little Help from another person bathing (including washing, rinsing, drying)?: A Lot Help from another person to put on and taking off regular upper body clothing?: A Little Help from another person to put on and taking off regular lower body clothing?: A Little 6 Click Score: 18    End of Session Equipment Utilized During Treatment: Rolling walker (2 wheels);Gait belt  OT Visit Diagnosis: Unsteadiness on feet (R26.81);Repeated falls (R29.6);Muscle weakness (generalized) (M62.81);Other (comment)   Activity Tolerance Patient tolerated treatment well   Patient Left in bed;with call bell/phone within reach;with family/visitor present   Nurse Communication Mobility status        Time: 5784-6962 OT Time Calculation (min): 20 min  Charges: OT General Charges $OT Visit: 1 Visit OT Treatments $Self Care/Home Management : 8-22 mins  Oleta Mouse, OTD OTR/L  03/08/23, 12:08 PM

## 2023-03-08 NOTE — Progress Notes (Signed)
Inpatient Rehabilitation Admission Medication Review by a Pharmacist  A complete drug regimen review was completed for this patient to identify any potential clinically significant medication issues.  High Risk Drug Classes Is patient taking? Indication by Medication  Antipsychotic Yes Compazine - nausea/ vomiting   Anticoagulant Yes Lovenox- DVT px   Antibiotic No   Opioid Yes Norco - PRN pain   Antiplatelet Yes Asa 81, plavix - stroke prevention  Hypoglycemics/insulin Yes SSI, semglee - T2DM  Vasoactive Medication Yes Norvasc - HTN  Chemotherapy No   Other Yes Gabapentin - nerve pain Protonix - GERD  Pirfenidone interstitial lung disease Crestor - HLD/ stroke prevention  Entresto - HF Melatonin - sleep Zoloft - MDD Trazodone - MDD Effexor -MDD      Type of Medication Issue Identified Description of Issue Recommendation(s)  Drug Interaction(s) (clinically significant)     Duplicate Therapy     Allergy     No Medication Administration End Date     Incorrect Dose     Additional Drug Therapy Needed     Significant med changes from prior encounter (inform family/care partners about these prior to discharge).    Other       Clinically significant medication issues were identified that warrant physician communication and completion of prescribed/recommended actions by midnight of the next day:  No  Name of provider notified for urgent issues identified: n/a  Provider Method of Notification: n/a  Pharmacist comments: n/a  Time spent performing this drug regimen review (minutes):  20 minutes   Toniann Fail Rubena Roseman 03/08/2023 2:25 PM

## 2023-03-08 NOTE — Care Management Important Message (Signed)
Important Message  Patient Details  Name: Erica [name] MRN: [id number] Date of Birth: Nov 11, 1955   Important Message Given:  N/A - LOS <3 / Initial given by admissions     [name] 03/08/2023, 7:57 AM

## 2023-03-08 NOTE — Progress Notes (Signed)
Patient ID: Erica [name], female   DOB: 09-30-1955, 67 y.o.   MRN: [id number]  INPATIENT REHABILITATION ADMISSION NOTE   Arrival Method: [facility]     Mental Orientation: x4   Assessment: See flowsheet   Skin: CDI   IV'S: n/a   Pain: none reported   Tubes and Drains: n/a   Safety Measures: in place   Vital Signs: see flowsheet   Height and Weight: see flowsheet   Rehab Orientation: completed   Family: notified by patient

## 2023-03-08 NOTE — Discharge Summary (Signed)
Physician Discharge Summary   Patient: Erica [name] MRN: [id number] DOB: May 09, 1956  Admit date:     03/04/2023  Discharge date: 03/08/23  Discharge Physician: Erica [name]   PCP: Erica Contras, MD   Recommendations at discharge:    Take medications as recommended  Discharge Diagnoses: Principal Problem:   Acute ischemic stroke Erica [name]) Active Problems:   Interstitial lung disease (HCC)   COPD (chronic obstructive pulmonary disease) (HCC)   MDD (major depressive disorder), recurrent severe, without psychosis (HCC)   Benign essential HTN   Controlled type 2 diabetes mellitus with hyperglycemia, with long-term current use of insulin (HCC)  Resolved Problems:   * No resolved hospital problems. [name] Course:   [name] is a 67 year old female with history of CVA with left-sided residual deficits, insulin-dependent diabetes mellitus, depression, anxiety, hyperlipidemia, GERD, who presents emergency department for chief concerns of altered mental status.   Vitals in the ED showed temperature of 97.9, respiration rate of 20, heart rate of 58, blood pressure 160/92, SpO2 of 100% on room air.   sNa is 138, potassium 3.8, chloride 111, bicarb 20, BUN 33, serum creatinine 1.55, EGFR of 36, nonfasting blood glucose 93, WBC 5.5, hemoglobin 11.5, platelets of 163.   CK is 58.  Troponin is 12.  UA was positive for trace leukocytes. COVID PCR was negative.   ED treatment: 1 L bolus.  Aspirin 324 mg p.o. one-time dose.  Complete echo ordered. ------------------------- At bedside, patient was able to tell me her name, age, location, current calendar year.   She was not able to tell me the reason why she is in the hospital.  She states she wants to go home now because she wants food.   Her sister is at bedside and states that this morning, she did not wake up.  It was difficult for her [name] to arouse her sister and so she called EMS.  Per sister, when EMS arrived they  checked her blood glucose and it was 102.   Sister states that patient takes her own medication.   Patient was able to follow commands and currently does not appear to be in acute distress.   Social history: She lives at home with her sister.  She denies tobacco, EtOH, recreational drug use.    Assessment and Plan:  * Acute ischemic stroke Altered mental status with right-sided mouth droop upon presentation. MRI of the brain showed small acute to sub acute stroke Complete echo showed normal EF no intracardiac source of embolism. EP interrogated loop recorder, no evidence of Afib. Neurology follow up appreciated - Bilateral small vessel disease appearing strokes, less likely cardioembolic, advised dual antiplatelets for 21 days. Continue aspirin, plavix(19 days) and statin. Heart healthy/ carb modified PT/ OT recommended rehab.For discharge to acute rehab today    Controlled type 2 diabetes mellitus with hyperglycemia, with long-term current use of insulin (HCC) A1c 6.9 Lantus 20 units qhs and monitor her blood sugars. Continue accucheks, sliding scale. Hypoglycemia protocol.   Benign essential HTN BP stable Continue Norvasc.   Prior h/o CVA (cerebral vascular accident) (HCC) Continue aspirin 81 mg daily, rosuvastatin 40 mg nightly.   MDD (major depressive disorder), recurrent severe, without psychosis (HCC) Continue sertraline 50 mg daily, venlafaxine 150 mg daily, trazodone 100 mg nightly    COPD (chronic obstructive pulmonary disease) (HCC) No acute exacerbation Continue albuterol inhalation every 6 hours as needed for wheezing and shortness of breath   Interstitial lung disease (HCC) Home  Physician Discharge Summary   Patient: Erica [name] MRN: [id number] DOB: May 09, 1956  Admit date:     03/04/2023  Discharge date: 03/08/23  Discharge Physician: Erica [name]   PCP: Erica Contras, MD   Recommendations at discharge:    Take medications as recommended  Discharge Diagnoses: Principal Problem:   Acute ischemic stroke Erica [name]) Active Problems:   Interstitial lung disease (HCC)   COPD (chronic obstructive pulmonary disease) (HCC)   MDD (major depressive disorder), recurrent severe, without psychosis (HCC)   Benign essential HTN   Controlled type 2 diabetes mellitus with hyperglycemia, with long-term current use of insulin (HCC)  Resolved Problems:   * No resolved hospital problems. [name] Course:   [name] is a 67 year old female with history of CVA with left-sided residual deficits, insulin-dependent diabetes mellitus, depression, anxiety, hyperlipidemia, GERD, who presents emergency department for chief concerns of altered mental status.   Vitals in the ED showed temperature of 97.9, respiration rate of 20, heart rate of 58, blood pressure 160/92, SpO2 of 100% on room air.   sNa is 138, potassium 3.8, chloride 111, bicarb 20, BUN 33, serum creatinine 1.55, EGFR of 36, nonfasting blood glucose 93, WBC 5.5, hemoglobin 11.5, platelets of 163.   CK is 58.  Troponin is 12.  UA was positive for trace leukocytes. COVID PCR was negative.   ED treatment: 1 L bolus.  Aspirin 324 mg p.o. one-time dose.  Complete echo ordered. ------------------------- At bedside, patient was able to tell me her name, age, location, current calendar year.   She was not able to tell me the reason why she is in the hospital.  She states she wants to go home now because she wants food.   Her sister is at bedside and states that this morning, she did not wake up.  It was difficult for her [name] to arouse her sister and so she called EMS.  Per sister, when EMS arrived they  checked her blood glucose and it was 102.   Sister states that patient takes her own medication.   Patient was able to follow commands and currently does not appear to be in acute distress.   Social history: She lives at home with her sister.  She denies tobacco, EtOH, recreational drug use.    Assessment and Plan:  * Acute ischemic stroke Altered mental status with right-sided mouth droop upon presentation. MRI of the brain showed small acute to sub acute stroke Complete echo showed normal EF no intracardiac source of embolism. EP interrogated loop recorder, no evidence of Afib. Neurology follow up appreciated - Bilateral small vessel disease appearing strokes, less likely cardioembolic, advised dual antiplatelets for 21 days. Continue aspirin, plavix(19 days) and statin. Heart healthy/ carb modified PT/ OT recommended rehab.For discharge to acute rehab today    Controlled type 2 diabetes mellitus with hyperglycemia, with long-term current use of insulin (HCC) A1c 6.9 Lantus 20 units qhs and monitor her blood sugars. Continue accucheks, sliding scale. Hypoglycemia protocol.   Benign essential HTN BP stable Continue Norvasc.   Prior h/o CVA (cerebral vascular accident) (HCC) Continue aspirin 81 mg daily, rosuvastatin 40 mg nightly.   MDD (major depressive disorder), recurrent severe, without psychosis (HCC) Continue sertraline 50 mg daily, venlafaxine 150 mg daily, trazodone 100 mg nightly    COPD (chronic obstructive pulmonary disease) (HCC) No acute exacerbation Continue albuterol inhalation every 6 hours as needed for wheezing and shortness of breath   Interstitial lung disease (HCC) Home  40 mg by mouth daily.   Pen Needles 32G X 6 MM Misc 1 application by Does not apply route 2 (two) times daily.   Pirfenidone 801 MG Tabs TAKE 1 TABLET (801 MG) BY MOUTH WITH BREAKFAST, WITH LUNCH, AND WITH EVENING MEAL. What changed: See the new instructions.   rosuvastatin 40 MG tablet Commonly known as: CRESTOR Take 1 tablet (40 mg total) by mouth daily. What changed:  medication strength how much to take Another medication with the same name was removed. Continue taking this medication, and follow the directions you see here.   senna-docusate 8.6-50 MG tablet Commonly known as: Senokot-S Take 1 tablet by mouth at bedtime.   sertraline 50 MG tablet Commonly known as: ZOLOFT Take 50 mg by mouth daily.   Symbicort 160-4.5 MCG/ACT inhaler Generic drug: budesonide-formoterol INHALE 2 PUFFS INTO THE LUNGS TWICE DAILY   thiamine 100 MG  tablet Commonly known as: Vitamin B-1 Take 1 tablet (100 mg total) by mouth daily. Start taking on: March 09, 2023   tiZANidine 4 MG tablet Commonly known as: ZANAFLEX Take 4 mg by mouth 2 (two) times daily as needed.   traZODone 100 MG tablet Commonly known as: DESYREL Take 1 tablet by mouth daily.   triamcinolone cream 0.1 % Commonly known as: KENALOG Apply topically 2 (two) times daily as needed.   venlafaxine XR 150 MG 24 hr capsule Commonly known as: EFFEXOR-XR Take 150 mg by mouth daily.        Follow-up Information     Erica Contras, MD Follow up.   Specialty: Internal Medicine Why: hospital follow up Contact information: [address] [place] [address] [phone]                Discharge Exam: Ceasar Mons Weights   03/04/23 0905  Weight: 75.7 kg   General - Elderly African-American female, no apparent distress HEENT - PERRLA, EOMI, atraumatic head, non tender sinuses. Lung - Clear, rales, rhonchi, wheezes. Heart - S1, S2 heard, no murmurs, rubs, no pedal edema Neuro - Alert, awake and oriented x 3, left sided weakness, speech difficulty. Skin - Warm and dry.    Condition at discharge: stable  The results of significant diagnostics from this hospitalization (including imaging, microbiology, ancillary and laboratory) are listed below for reference.   Imaging Studies: ECHOCARDIOGRAM COMPLETE  Result Date: 03/05/2023    ECHOCARDIOGRAM REPORT   Patient Name:   TEKIA BRONIKOWSKI Date of Exam: 03/05/2023 Medical Rec #:  [id number]    Height:       63.0 in Accession #:    [id number]   Weight:       166.9 lb Date of Birth:  January 09, 1956    BSA:          1.790 m Patient Age:    67 years     BP:           145/74 mmHg Patient Gender: F            HR:           64 bpm. Exam Location:  [facility] Procedure: 2D Echo, Cardiac Doppler and Color Doppler Indications:     Stroke I63.9  History:         Patient has no prior history of Echocardiogram examinations.                   Stroke; Risk Factors:Hypertension.  Sonographer:     Neysa Bonito Roar Referring Phys:  [id number] AMY N [facility]  Physician Discharge Summary   Patient: Erica [name] MRN: [id number] DOB: May 09, 1956  Admit date:     03/04/2023  Discharge date: 03/08/23  Discharge Physician: Erica [name]   PCP: Erica Contras, MD   Recommendations at discharge:    Take medications as recommended  Discharge Diagnoses: Principal Problem:   Acute ischemic stroke Erica [name]) Active Problems:   Interstitial lung disease (HCC)   COPD (chronic obstructive pulmonary disease) (HCC)   MDD (major depressive disorder), recurrent severe, without psychosis (HCC)   Benign essential HTN   Controlled type 2 diabetes mellitus with hyperglycemia, with long-term current use of insulin (HCC)  Resolved Problems:   * No resolved hospital problems. [name] Course:   [name] is a 67 year old female with history of CVA with left-sided residual deficits, insulin-dependent diabetes mellitus, depression, anxiety, hyperlipidemia, GERD, who presents emergency department for chief concerns of altered mental status.   Vitals in the ED showed temperature of 97.9, respiration rate of 20, heart rate of 58, blood pressure 160/92, SpO2 of 100% on room air.   sNa is 138, potassium 3.8, chloride 111, bicarb 20, BUN 33, serum creatinine 1.55, EGFR of 36, nonfasting blood glucose 93, WBC 5.5, hemoglobin 11.5, platelets of 163.   CK is 58.  Troponin is 12.  UA was positive for trace leukocytes. COVID PCR was negative.   ED treatment: 1 L bolus.  Aspirin 324 mg p.o. one-time dose.  Complete echo ordered. ------------------------- At bedside, patient was able to tell me her name, age, location, current calendar year.   She was not able to tell me the reason why she is in the hospital.  She states she wants to go home now because she wants food.   Her sister is at bedside and states that this morning, she did not wake up.  It was difficult for her [name] to arouse her sister and so she called EMS.  Per sister, when EMS arrived they  checked her blood glucose and it was 102.   Sister states that patient takes her own medication.   Patient was able to follow commands and currently does not appear to be in acute distress.   Social history: She lives at home with her sister.  She denies tobacco, EtOH, recreational drug use.    Assessment and Plan:  * Acute ischemic stroke Altered mental status with right-sided mouth droop upon presentation. MRI of the brain showed small acute to sub acute stroke Complete echo showed normal EF no intracardiac source of embolism. EP interrogated loop recorder, no evidence of Afib. Neurology follow up appreciated - Bilateral small vessel disease appearing strokes, less likely cardioembolic, advised dual antiplatelets for 21 days. Continue aspirin, plavix(19 days) and statin. Heart healthy/ carb modified PT/ OT recommended rehab.For discharge to acute rehab today    Controlled type 2 diabetes mellitus with hyperglycemia, with long-term current use of insulin (HCC) A1c 6.9 Lantus 20 units qhs and monitor her blood sugars. Continue accucheks, sliding scale. Hypoglycemia protocol.   Benign essential HTN BP stable Continue Norvasc.   Prior h/o CVA (cerebral vascular accident) (HCC) Continue aspirin 81 mg daily, rosuvastatin 40 mg nightly.   MDD (major depressive disorder), recurrent severe, without psychosis (HCC) Continue sertraline 50 mg daily, venlafaxine 150 mg daily, trazodone 100 mg nightly    COPD (chronic obstructive pulmonary disease) (HCC) No acute exacerbation Continue albuterol inhalation every 6 hours as needed for wheezing and shortness of breath   Interstitial lung disease (HCC) Home  40 mg by mouth daily.   Pen Needles 32G X 6 MM Misc 1 application by Does not apply route 2 (two) times daily.   Pirfenidone 801 MG Tabs TAKE 1 TABLET (801 MG) BY MOUTH WITH BREAKFAST, WITH LUNCH, AND WITH EVENING MEAL. What changed: See the new instructions.   rosuvastatin 40 MG tablet Commonly known as: CRESTOR Take 1 tablet (40 mg total) by mouth daily. What changed:  medication strength how much to take Another medication with the same name was removed. Continue taking this medication, and follow the directions you see here.   senna-docusate 8.6-50 MG tablet Commonly known as: Senokot-S Take 1 tablet by mouth at bedtime.   sertraline 50 MG tablet Commonly known as: ZOLOFT Take 50 mg by mouth daily.   Symbicort 160-4.5 MCG/ACT inhaler Generic drug: budesonide-formoterol INHALE 2 PUFFS INTO THE LUNGS TWICE DAILY   thiamine 100 MG  tablet Commonly known as: Vitamin B-1 Take 1 tablet (100 mg total) by mouth daily. Start taking on: March 09, 2023   tiZANidine 4 MG tablet Commonly known as: ZANAFLEX Take 4 mg by mouth 2 (two) times daily as needed.   traZODone 100 MG tablet Commonly known as: DESYREL Take 1 tablet by mouth daily.   triamcinolone cream 0.1 % Commonly known as: KENALOG Apply topically 2 (two) times daily as needed.   venlafaxine XR 150 MG 24 hr capsule Commonly known as: EFFEXOR-XR Take 150 mg by mouth daily.        Follow-up Information     Erica Contras, MD Follow up.   Specialty: Internal Medicine Why: hospital follow up Contact information: [address] [place] [address] [phone]                Discharge Exam: Ceasar Mons Weights   03/04/23 0905  Weight: 75.7 kg   General - Elderly African-American female, no apparent distress HEENT - PERRLA, EOMI, atraumatic head, non tender sinuses. Lung - Clear, rales, rhonchi, wheezes. Heart - S1, S2 heard, no murmurs, rubs, no pedal edema Neuro - Alert, awake and oriented x 3, left sided weakness, speech difficulty. Skin - Warm and dry.    Condition at discharge: stable  The results of significant diagnostics from this hospitalization (including imaging, microbiology, ancillary and laboratory) are listed below for reference.   Imaging Studies: ECHOCARDIOGRAM COMPLETE  Result Date: 03/05/2023    ECHOCARDIOGRAM REPORT   Patient Name:   TEKIA BRONIKOWSKI Date of Exam: 03/05/2023 Medical Rec #:  [id number]    Height:       63.0 in Accession #:    [id number]   Weight:       166.9 lb Date of Birth:  January 09, 1956    BSA:          1.790 m Patient Age:    67 years     BP:           145/74 mmHg Patient Gender: F            HR:           64 bpm. Exam Location:  [facility] Procedure: 2D Echo, Cardiac Doppler and Color Doppler Indications:     Stroke I63.9  History:         Patient has no prior history of Echocardiogram examinations.                   Stroke; Risk Factors:Hypertension.  Sonographer:     Neysa Bonito Roar Referring Phys:  [id number] AMY N [facility]  Physician Discharge Summary   Patient: Erica [name] MRN: [id number] DOB: May 09, 1956  Admit date:     03/04/2023  Discharge date: 03/08/23  Discharge Physician: Erica [name]   PCP: Erica Contras, MD   Recommendations at discharge:    Take medications as recommended  Discharge Diagnoses: Principal Problem:   Acute ischemic stroke Erica [name]) Active Problems:   Interstitial lung disease (HCC)   COPD (chronic obstructive pulmonary disease) (HCC)   MDD (major depressive disorder), recurrent severe, without psychosis (HCC)   Benign essential HTN   Controlled type 2 diabetes mellitus with hyperglycemia, with long-term current use of insulin (HCC)  Resolved Problems:   * No resolved hospital problems. [name] Course:   [name] is a 67 year old female with history of CVA with left-sided residual deficits, insulin-dependent diabetes mellitus, depression, anxiety, hyperlipidemia, GERD, who presents emergency department for chief concerns of altered mental status.   Vitals in the ED showed temperature of 97.9, respiration rate of 20, heart rate of 58, blood pressure 160/92, SpO2 of 100% on room air.   sNa is 138, potassium 3.8, chloride 111, bicarb 20, BUN 33, serum creatinine 1.55, EGFR of 36, nonfasting blood glucose 93, WBC 5.5, hemoglobin 11.5, platelets of 163.   CK is 58.  Troponin is 12.  UA was positive for trace leukocytes. COVID PCR was negative.   ED treatment: 1 L bolus.  Aspirin 324 mg p.o. one-time dose.  Complete echo ordered. ------------------------- At bedside, patient was able to tell me her name, age, location, current calendar year.   She was not able to tell me the reason why she is in the hospital.  She states she wants to go home now because she wants food.   Her sister is at bedside and states that this morning, she did not wake up.  It was difficult for her [name] to arouse her sister and so she called EMS.  Per sister, when EMS arrived they  checked her blood glucose and it was 102.   Sister states that patient takes her own medication.   Patient was able to follow commands and currently does not appear to be in acute distress.   Social history: She lives at home with her sister.  She denies tobacco, EtOH, recreational drug use.    Assessment and Plan:  * Acute ischemic stroke Altered mental status with right-sided mouth droop upon presentation. MRI of the brain showed small acute to sub acute stroke Complete echo showed normal EF no intracardiac source of embolism. EP interrogated loop recorder, no evidence of Afib. Neurology follow up appreciated - Bilateral small vessel disease appearing strokes, less likely cardioembolic, advised dual antiplatelets for 21 days. Continue aspirin, plavix(19 days) and statin. Heart healthy/ carb modified PT/ OT recommended rehab.For discharge to acute rehab today    Controlled type 2 diabetes mellitus with hyperglycemia, with long-term current use of insulin (HCC) A1c 6.9 Lantus 20 units qhs and monitor her blood sugars. Continue accucheks, sliding scale. Hypoglycemia protocol.   Benign essential HTN BP stable Continue Norvasc.   Prior h/o CVA (cerebral vascular accident) (HCC) Continue aspirin 81 mg daily, rosuvastatin 40 mg nightly.   MDD (major depressive disorder), recurrent severe, without psychosis (HCC) Continue sertraline 50 mg daily, venlafaxine 150 mg daily, trazodone 100 mg nightly    COPD (chronic obstructive pulmonary disease) (HCC) No acute exacerbation Continue albuterol inhalation every 6 hours as needed for wheezing and shortness of breath   Interstitial lung disease (HCC) Home  40 mg by mouth daily.   Pen Needles 32G X 6 MM Misc 1 application by Does not apply route 2 (two) times daily.   Pirfenidone 801 MG Tabs TAKE 1 TABLET (801 MG) BY MOUTH WITH BREAKFAST, WITH LUNCH, AND WITH EVENING MEAL. What changed: See the new instructions.   rosuvastatin 40 MG tablet Commonly known as: CRESTOR Take 1 tablet (40 mg total) by mouth daily. What changed:  medication strength how much to take Another medication with the same name was removed. Continue taking this medication, and follow the directions you see here.   senna-docusate 8.6-50 MG tablet Commonly known as: Senokot-S Take 1 tablet by mouth at bedtime.   sertraline 50 MG tablet Commonly known as: ZOLOFT Take 50 mg by mouth daily.   Symbicort 160-4.5 MCG/ACT inhaler Generic drug: budesonide-formoterol INHALE 2 PUFFS INTO THE LUNGS TWICE DAILY   thiamine 100 MG  tablet Commonly known as: Vitamin B-1 Take 1 tablet (100 mg total) by mouth daily. Start taking on: March 09, 2023   tiZANidine 4 MG tablet Commonly known as: ZANAFLEX Take 4 mg by mouth 2 (two) times daily as needed.   traZODone 100 MG tablet Commonly known as: DESYREL Take 1 tablet by mouth daily.   triamcinolone cream 0.1 % Commonly known as: KENALOG Apply topically 2 (two) times daily as needed.   venlafaxine XR 150 MG 24 hr capsule Commonly known as: EFFEXOR-XR Take 150 mg by mouth daily.        Follow-up Information     Erica Contras, MD Follow up.   Specialty: Internal Medicine Why: hospital follow up Contact information: [address] [place] [address] [phone]                Discharge Exam: Ceasar Mons Weights   03/04/23 0905  Weight: 75.7 kg   General - Elderly African-American female, no apparent distress HEENT - PERRLA, EOMI, atraumatic head, non tender sinuses. Lung - Clear, rales, rhonchi, wheezes. Heart - S1, S2 heard, no murmurs, rubs, no pedal edema Neuro - Alert, awake and oriented x 3, left sided weakness, speech difficulty. Skin - Warm and dry.    Condition at discharge: stable  The results of significant diagnostics from this hospitalization (including imaging, microbiology, ancillary and laboratory) are listed below for reference.   Imaging Studies: ECHOCARDIOGRAM COMPLETE  Result Date: 03/05/2023    ECHOCARDIOGRAM REPORT   Patient Name:   TEKIA BRONIKOWSKI Date of Exam: 03/05/2023 Medical Rec #:  [id number]    Height:       63.0 in Accession #:    [id number]   Weight:       166.9 lb Date of Birth:  January 09, 1956    BSA:          1.790 m Patient Age:    67 years     BP:           145/74 mmHg Patient Gender: F            HR:           64 bpm. Exam Location:  [facility] Procedure: 2D Echo, Cardiac Doppler and Color Doppler Indications:     Stroke I63.9  History:         Patient has no prior history of Echocardiogram examinations.                   Stroke; Risk Factors:Hypertension.  Sonographer:     Neysa Bonito Roar Referring Phys:  [id number] AMY N [facility]  40 mg by mouth daily.   Pen Needles 32G X 6 MM Misc 1 application by Does not apply route 2 (two) times daily.   Pirfenidone 801 MG Tabs TAKE 1 TABLET (801 MG) BY MOUTH WITH BREAKFAST, WITH LUNCH, AND WITH EVENING MEAL. What changed: See the new instructions.   rosuvastatin 40 MG tablet Commonly known as: CRESTOR Take 1 tablet (40 mg total) by mouth daily. What changed:  medication strength how much to take Another medication with the same name was removed. Continue taking this medication, and follow the directions you see here.   senna-docusate 8.6-50 MG tablet Commonly known as: Senokot-S Take 1 tablet by mouth at bedtime.   sertraline 50 MG tablet Commonly known as: ZOLOFT Take 50 mg by mouth daily.   Symbicort 160-4.5 MCG/ACT inhaler Generic drug: budesonide-formoterol INHALE 2 PUFFS INTO THE LUNGS TWICE DAILY   thiamine 100 MG  tablet Commonly known as: Vitamin B-1 Take 1 tablet (100 mg total) by mouth daily. Start taking on: March 09, 2023   tiZANidine 4 MG tablet Commonly known as: ZANAFLEX Take 4 mg by mouth 2 (two) times daily as needed.   traZODone 100 MG tablet Commonly known as: DESYREL Take 1 tablet by mouth daily.   triamcinolone cream 0.1 % Commonly known as: KENALOG Apply topically 2 (two) times daily as needed.   venlafaxine XR 150 MG 24 hr capsule Commonly known as: EFFEXOR-XR Take 150 mg by mouth daily.        Follow-up Information     Erica Contras, MD Follow up.   Specialty: Internal Medicine Why: hospital follow up Contact information: [address] [place] [address] [phone]                Discharge Exam: [facility]   03/04/23 0905  Weight: 75.7 kg   General - Elderly African-American female, no apparent distress HEENT - PERRLA, EOMI, atraumatic head, non tender sinuses. Lung - Clear, rales, rhonchi, wheezes. Heart - S1, S2 heard, no murmurs, rubs, no pedal edema Neuro - Alert, awake and oriented x 3, left sided weakness, speech difficulty. Skin - Warm and dry.    Condition at discharge: stable  The results of significant diagnostics from this hospitalization (including imaging, microbiology, ancillary and laboratory) are listed below for reference.   Imaging Studies: ECHOCARDIOGRAM COMPLETE  Result Date: 03/05/2023    ECHOCARDIOGRAM REPORT   Patient Name:   TEKIA BRONIKOWSKI Date of Exam: 03/05/2023 Medical Rec #:  [id number]    Height:       63.0 in Accession #:    [id number]   Weight:       166.9 lb Date of Birth:  January 09, 1956    BSA:          1.790 m Patient Age:    67 years     BP:           145/74 mmHg Patient Gender: F            HR:           64 bpm. Exam Location:  [facility] Procedure: 2D Echo, Cardiac Doppler and Color Doppler Indications:     Stroke I63.9  History:         Patient has no prior history of Echocardiogram examinations.                   Stroke; Risk Factors:Hypertension.  Sonographer:     Neysa Bonito Roar Referring Phys:  [id number] AMY N COX

## 2023-03-08 NOTE — Progress Notes (Signed)
Inpatient Rehab Admissions Coordinator:    Pt. To admit to CIR today. RN may call report to [phone]  Pt. To admit to CIR today with the plan to d/c home after 7-10 days with the support of her sister.   Megan Salon, MS, CCC-SLP Rehab Admissions Coordinator  [phone] (celll) [phone] (office)

## 2023-03-08 NOTE — H&P (Incomplete)
Physical Medicine and Rehabilitation Admission H&P    Chief Complaint  Patient presents with   Stroke with functional deficits    HPI:  Erica [name] is a 67 year old female with history of HTN, T2DM, depression, CVA with residual left sided deficits, rectal cancer, GERD, COPD/emphysema, pulmonary fibrosis with baseline dyspnea and chronic cough who was admitted to [facility] on 03/04/23 with  mental status changes and difficulty arousing patient from sleep. She was found to have small acute to subacute infarcts in bilateral centrumovale.  Dr. Iver Nestle evaluated patient and felt that stroke likely due to small vessel disease. 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 Carotid ultrasound without significant ICA stenosis. MRA without occlusion or significant stenosis.  2 D echo showed EF- 65-70/5 without wall abnormality. She was started on DAPT X 21 days to be followed by ASA alone--does have a loop recorder to rule out A fib.  Vitamin B/thiamine WNL.HIV, RPR, ANCA negative and mild elevation in ESR (normal CRP) not felt to be infectious etiology.   Patient with ataxic gait, fatigue, cognitive deficits and decrease control of LLE w/balance deficits. Therapy has been working with patient who requires min to mod assist with ADLs and mobility. CIR recommended due to functional decline.     Review of Systems  HENT:  Negative for hearing loss.   Eyes:  Positive for blurred vision (lost her glasses).  Respiratory:  Negative for cough and shortness of breath.   Cardiovascular:  Negative for chest pain and palpitations.  Gastrointestinal:  Negative for constipation and heartburn.  Musculoskeletal:  Positive for myalgias.  Neurological:  Positive for speech change and weakness.  Psychiatric/Behavioral:  The patient does not have insomnia.      Past Medical History:  Diagnosis Date   Arthritis     especially in shoulders   Asthma    COPD (chronic obstructive pulmonary disease) (HCC)    Depression    Diabetes mellitus    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   GERD (gastroesophageal reflux disease)    with chronic cough   Headache(784.0)    "mild"   Hypertension    Loop Biotronik loop implant 12/16/2020 12/16/2020   Mental disorder    depression   Neuropathy    feet    Pain    arthritis pain - takes tramadol as needed   Pulmonary fibrosis, unspecified (HCC)    Rectal cancer (HCC)    Rectal polyp    very little bleeding with bowel movements- no pain   Stroke (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   Suicide attempt [facility])     Past Surgical History:  Procedure Laterality Date   ANAL RECTAL MANOMETRY N/A 07/13/2016   Procedure: ANO RECTAL MANOMETRY;  Surgeon: Romie Levee, MD;  Location: [facility];  Service: Endoscopy;  Laterality: N/A;   CESAREAN SECTION     EUS N/A 11/21/2012   Procedure: LOWER ENDOSCOPIC ULTRASOUND (EUS);  Surgeon: Willis Modena, MD;  Location[facility];  Service: Endoscopy;  Laterality: N/A;   FLEXIBLE SIGMOIDOSCOPY N/A 11/21/2012   Procedure: FLEXIBLE SIGMOIDOSCOPY;  Surgeon: Willis Modena, MD;  Location: [facility];  Service: Endoscopy;  Laterality: N/A;   FLEXIBLE SIGMOIDOSCOPY N/A 01/28/2013   Procedure: FLEXIBLE SIGMOIDOSCOPY;  Surgeon: Romie Levee, MD;  Location: [facility];  Service: Endoscopy;  Laterality: N/A;   LAPAROSCOPIC LOW ANTERIOR RESECTION N/A 01/29/2013   Procedure: LAPAROSCOPIC LOW ANTERIOR RESECTION, Rigid Proctoscopy;  Surgeon:  Romie Levee, MD;  Location: [facility];  Service: General;  Laterality: N/A;   LAPAROSCOPIC SIGMOID COLECTOMY N/A 11/14/2012   Procedure: DIAGNOSTIC LAPAROSCOPY AND SIGMOIDMOIDOSCOPY ;  Surgeon: Emelia Loron, MD;  Location: [facility];  Service: General;  Laterality: N/A;   RECTAL ULTRASOUND N/A 07/13/2016   Procedure: RECTAL ULTRASOUND;  Surgeon: Romie Levee, MD;  Location: [facility];  Service: Endoscopy;  Laterality:  N/A;   TONSILLECTOMY     TONSILLECTOMY AND ADENOIDECTOMY      Family History  Problem Relation Age of Onset   Hypertension Father    Stroke Father    Diabetes Father    Heart disease Mother    Hypertension Mother    Hyperlipidemia Mother    Hypertension Sister    Hypertension Brother    Hypertension Sister    Hypertension Brother    Cancer Other 71       colon cancer    Cancer Maternal Aunt        cancer, unknown type    Cancer Maternal Uncle        bone cancer    Cancer Paternal Aunt        lung cancer   Cancer Paternal Uncle        lung cancer   Cancer Maternal Uncle        colon cancer and prostate cancer    Cancer Maternal Uncle        prostate cancer     Social History:  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 Has been staying with her sister. Needed assistance with medication management. She reports that she quit smoking about 5 years ago. Her smoking use included cigarettes. She started smoking about 25 years ago. She has a 10 pack-year smoking history. She has never used smokeless tobacco. She reports that she does not currently use alcohol. She reports that she does not currently use drugs after having used the following drugs: "Crack" cocaine.   Allergies  Allergen Reactions   Penicillins Hives, Itching and Swelling    Tongue swelling    Medications Prior to Admission  Medication Sig Dispense Refill   acetaminophen (TYLENOL) 325 MG tablet Take 2 tablets (650 mg total) by mouth every 4 (four) hours as needed for mild pain (or temp > 37.5 C (99.5 F)).     albuterol (VENTOLIN HFA) 108 (90 Base) MCG/ACT inhaler Inhale 2 puffs into the lungs every 4 (four) hours as needed for wheezing. 1 g 5   amLODipine (NORVASC) 10 MG tablet Take 1 tablet (10 mg total) by mouth daily. 30 tablet 2   aspirin EC 81 MG EC tablet Take 1 tablet (81 mg total) by mouth daily. Swallow whole. 30 tablet 11   [START ON 03/09/2023] clopidogrel (PLAVIX) 75 MG tablet Take 1  tablet (75 mg total) by mouth daily for 19 days. 19 tablet 0   [START ON 03/09/2023] cyanocobalamin 1000 MCG tablet Take 1 tablet (1,000 mcg total) by mouth daily. 30 tablet 0   diclofenac Sodium (VOLTAREN) 1 % GEL SMARTSIG:4 Gram(s) Topical 4 Times Daily PRN     ENTRESTO 49-51 MG TAKE 1 TABLET BY MOUTH TWICE DAILY 180 tablet 1   famotidine (PEPCID) 20 MG tablet Take 1 tablet (20 mg total) by mouth daily. 30 tablet 0   fenofibrate (TRICOR) 145 MG tablet TAKE 1 TABLET(145 MG) BY MOUTH DAILY (Patient taking differently: Take 145 mg by mouth daily.) 90 tablet 3   furosemide (LASIX) 20 MG  tablet Take 1 tablet (20 mg total) by mouth daily. (Patient not taking: Reported on 03/04/2023) 4 tablet 0   gabapentin (NEURONTIN) 100 MG capsule Take 2 capsules (200 mg total) by mouth 2 (two) times daily as needed (pain). (Patient taking differently: Take 100 mg by mouth 2 (two) times daily. Patient is taking as needed) 60 capsule 0   HYDROcodone-acetaminophen (NORCO/VICODIN) 5-325 MG tablet Take 1 tablet by mouth every 6 (six) hours as needed.     [START ON 03/09/2023] insulin glargine-yfgn (SEMGLEE) 100 UNIT/ML injection Inject 0.2 mLs (20 Units total) into the skin daily. 10 mL 11   Insulin Pen Needle (PEN NEEDLES) 32G X 6 MM MISC 1 application by Does not apply route 2 (two) times daily. 100 each 1   pantoprazole (PROTONIX) 40 MG tablet Take 40 mg by mouth daily.     Pirfenidone 801 MG TABS TAKE 1 TABLET (801 MG) BY MOUTH WITH BREAKFAST, WITH LUNCH, AND WITH EVENING MEAL. (Patient taking differently: Take 1 tablet by mouth daily. Patient is taking 1 tablet daily current with breakfast) 270 tablet 0   rosuvastatin (CRESTOR) 40 MG tablet Take 1 tablet (40 mg total) by mouth daily. 30 tablet 1   senna-docusate (SENOKOT-S) 8.6-50 MG tablet Take 1 tablet by mouth at bedtime. 30 tablet 0   sertraline (ZOLOFT) 50 MG tablet Take 50 mg by mouth daily.     SYMBICORT 160-4.5 MCG/ACT inhaler INHALE 2 PUFFS INTO THE LUNGS TWICE  DAILY (Patient not taking: Reported on 03/04/2023) 10.2 g 5   [START ON 03/09/2023] thiamine (VITAMIN B-1) 100 MG tablet Take 1 tablet (100 mg total) by mouth daily. 30 tablet 0   tiZANidine (ZANAFLEX) 4 MG tablet Take 4 mg by mouth 2 (two) times daily as needed.     traZODone (DESYREL) 100 MG tablet Take 1 tablet by mouth daily.     triamcinolone cream (KENALOG) 0.1 % Apply topically 2 (two) times daily as needed.     venlafaxine XR (EFFEXOR-XR) 150 MG 24 hr capsule Take 150 mg by mouth daily.        Home: Home Living Family/patient expects to be discharged to:: Private residence Living Arrangements: Other relatives (sister) Available Help at Discharge: Family, Available 24 hours/day Type of Home: House Home Access: Level entry Home Layout: One level (one step from kitchen to another room) Bathroom Shower/Tub: Tub only (sister will take her pt's niece's house which is next door to take a shower) Bathroom Toilet: Handicapped height Bathroom Accessibility: Yes Home Equipment: Cane - single point, Shower seat  Lives With: Family (sister)   Functional History: Prior Function Prior Level of Function : Independent/Modified Independent Mobility Comments: ambulates with use of a cane ADLs Comments: Ind with self care tasks and sister assist with IADLs as needed  Functional Status:  Mobility: Bed Mobility Overal bed mobility: Needs Assistance Bed Mobility: Supine to Sit, Sit to Supine Supine to sit: Supervision Sit to supine: Supervision General bed mobility comments: intermittent vcs for technique Transfers Overall transfer level: Needs assistance Equipment used: 1 person hand held assist, Rolling walker (2 wheels) Transfers: Sit to/from Stand Sit to Stand: Min assist, Contact guard assist (MIN A for STS with HHA, MOD A for lateral steps up the bed; CGA for STS with RW) General transfer comment: Increased time to complete task and sequencing Ambulation/Gait Ambulation/Gait  assistance: Contact guard assist, Min assist Gait Distance (Feet): 80 Feet Assistive device: Straight cane Gait Pattern/deviations: Step-to pattern, Ataxic, Drifts right/left, Narrow base of  support, Decreased dorsiflexion - left General Gait Details: Remains a high fall risk due to decreased strength and motor control L LE. Increased balance with use of RW Gait velocity: decr    ADL: ADL Overall ADL's : Needs assistance/impaired Grooming: Wash/dry hands, Standing, Contact guard assist Lower Body Dressing: Minimal assistance, Sit to/from stand Lower Body Dressing Details (indicate cue type and reason): pants Toilet Transfer: Ambulation, Rolling walker (2 wheels), Contact guard assist Toileting- Clothing Manipulation and Hygiene: Contact guard assist, Sitting/lateral lean Functional mobility during ADLs: Contact guard assist, Minimal assistance, Rolling walker (2 wheels) (approx 15' two attempts; frequent vcs for technique, L foot drop noted) General ADL Comments: MOD A + HHA for toilet t/f, improves to MIN A with use of RW, asssit for RW mgmt. CGA hand washing standing sink side. MIN A don B shoes in sitting, assist to tie shoe laces  Cognition: Cognition Overall Cognitive Status: No family/caregiver present to determine baseline cognitive functioning Arousal/Alertness: Awake/alert Orientation Level: Oriented X4 Comments: deferred due to increased lethargy Cognition Arousal: Alert Behavior During Therapy: WFL for tasks assessed/performed, Flat affect Overall Cognitive Status: No family/caregiver present to determine baseline cognitive functioning Area of Impairment: Memory, Problem solving Memory: Decreased short-term memory Problem Solving: Slow processing, Requires verbal cues General Comments: Pleasant and cooperative   Blood pressure 136/85, pulse 64, temperature (!) 97.5 F (36.4 C), resp. rate 18, height 5\' 3"  (1.6 m), weight 75.7 kg, SpO2 100%. Physical Exam Vitals  reviewed.  Constitutional:      Appearance: Normal appearance.  Skin:    General: Skin is warm and dry.  Neurological:     Mental Status: She is alert and oriented to person, place, and time.     Comments: Able to answer basic orientation questions and follow simple motor commands. Speech halting and slow. Left sided weakness with LUE>RUE ataxia.      Results for orders placed or performed during the hospital encounter of 03/04/23 (from the past 48 hour(s))  Glucose, capillary     Status: Abnormal   Collection Time: 03/06/23  4:46 PM  Result Value Ref Range   Glucose-Capillary 205 (H) 70 - 99 mg/dL    Comment: Glucose reference range applies only to samples taken after fasting for at least 8 hours.  Glucose, capillary     Status: Abnormal   Collection Time: 03/06/23  9:08 PM  Result Value Ref Range   Glucose-Capillary 191 (H) 70 - 99 mg/dL    Comment: Glucose reference range applies only to samples taken after fasting for at least 8 hours.  Glucose, capillary     Status: Abnormal   Collection Time: 03/07/23  7:32 AM  Result Value Ref Range   Glucose-Capillary 175 (H) 70 - 99 mg/dL    Comment: Glucose reference range applies only to samples taken after fasting for at least 8 hours.  Glucose, capillary     Status: Abnormal   Collection Time: 03/07/23 11:23 AM  Result Value Ref Range   Glucose-Capillary 198 (H) 70 - 99 mg/dL    Comment: Glucose reference range applies only to samples taken after fasting for at least 8 hours.  Glucose, capillary     Status: Abnormal   Collection Time: 03/07/23  4:23 PM  Result Value Ref Range   Glucose-Capillary 153 (H) 70 - 99 mg/dL    Comment: Glucose reference range applies only to samples taken after fasting for at least 8 hours.  Glucose, capillary     Status: Abnormal  Collection Time: 03/07/23  8:47 PM  Result Value Ref Range   Glucose-Capillary 232 (H) 70 - 99 mg/dL    Comment: Glucose reference range applies only to samples taken after  fasting for at least 8 hours.  Glucose, capillary     Status: Abnormal   Collection Time: 03/08/23  8:54 AM  Result Value Ref Range   Glucose-Capillary 193 (H) 70 - 99 mg/dL    Comment: Glucose reference range applies only to samples taken after fasting for at least 8 hours.  Glucose, capillary     Status: Abnormal   Collection Time: 03/08/23 11:50 AM  Result Value Ref Range   Glucose-Capillary 148 (H) 70 - 99 mg/dL    Comment: Glucose reference range applies only to samples taken after fasting for at least 8 hours.   No results found.    Blood pressure 136/85, pulse 64, temperature (!) 97.5 F (36.4 C), resp. rate 18, height 5\' 3"  (1.6 m), weight 75.7 kg, SpO2 100%.  Medical Problem List and Plan: 1. Functional deficits secondary to ***  -patient may *** shower  -ELOS/Goals: *** 2.  Antithrombotics: -DVT/anticoagulation:  Pharmaceutical: Lovenox  -antiplatelet therapy: DAPT X 3 weeks followed by ASA alont.  3. Pain Management:  tylenol prn.  4. Mood/Behavior/Sleep: LCSW to follow for evaluation and support  --melatonin prn for insomnia.   -antipsychotic agents: N/A 5. Neuropsych/cognition: This patient is not fully capable of making decisions on her own behalf. 6. Skin/Wound Care: Routine pressure relief measures.  7. Fluids/Electrolytes/Nutrition: Monitor I/O. Check CMET in am. 8. HTN: Monitor BP TID. Continue amlodipine 10 mg daily and Enetresto 9. T2DM: Hgb A1C-6.9.  Was on lantus 24 u am/18 u pm  --On insulin glargine 21units daily 10 H/o COPD/IPF: Per records has chronic DOE/cough.  --on Pirfenidone TID. No MDI-->followed by Dr. Marchelle Gearing 11. GERD: On protonix  12. H/o Depression/anxiety/SA: Continue Effexor and Zoloft 13.     ***  Jacquelynn Cree, PA-C 03/08/2023

## 2023-03-09 DIAGNOSIS — I10 Essential (primary) hypertension: Secondary | ICD-10-CM | POA: Diagnosis not present

## 2023-03-09 DIAGNOSIS — Z794 Long term (current) use of insulin: Secondary | ICD-10-CM

## 2023-03-09 DIAGNOSIS — E1121 Type 2 diabetes mellitus with diabetic nephropathy: Secondary | ICD-10-CM

## 2023-03-09 DIAGNOSIS — I639 Cerebral infarction, unspecified: Secondary | ICD-10-CM | POA: Diagnosis not present

## 2023-03-09 DIAGNOSIS — N1832 Chronic kidney disease, stage 3b: Secondary | ICD-10-CM

## 2023-03-09 LAB — CBC WITH DIFFERENTIAL/PLATELET
Abs Immature Granulocytes: 0.02 10*3/uL (ref 0.00–0.07)
Basophils Absolute: 0 10*3/uL (ref 0.0–0.1)
Basophils Relative: 0 %
Eosinophils Absolute: 0.1 10*3/uL (ref 0.0–0.5)
Eosinophils Relative: 2 %
HCT: 28.9 % — ABNORMAL LOW (ref 36.0–46.0)
Hemoglobin: 10.6 g/dL — ABNORMAL LOW (ref 12.0–15.0)
Immature Granulocytes: 0 %
Lymphocytes Relative: 21 %
Lymphs Abs: 0.9 10*3/uL (ref 0.7–4.0)
MCH: 32.8 pg (ref 26.0–34.0)
MCHC: 36.7 g/dL — ABNORMAL HIGH (ref 30.0–36.0)
MCV: 89.5 fL (ref 80.0–100.0)
Monocytes Absolute: 0.3 10*3/uL (ref 0.1–1.0)
Monocytes Relative: 6 %
Neutro Abs: 3.2 10*3/uL (ref 1.7–7.7)
Neutrophils Relative %: 71 %
Platelets: 143 10*3/uL — ABNORMAL LOW (ref 150–400)
RBC: 3.23 MIL/uL — ABNORMAL LOW (ref 3.87–5.11)
RDW: 16 % — ABNORMAL HIGH (ref 11.5–15.5)
WBC: 4.5 10*3/uL (ref 4.0–10.5)
nRBC: 0 % (ref 0.0–0.2)

## 2023-03-09 LAB — COMPREHENSIVE METABOLIC PANEL
ALT: 10 U/L (ref 0–44)
AST: 18 U/L (ref 15–41)
Albumin: 2.6 g/dL — ABNORMAL LOW (ref 3.5–5.0)
Alkaline Phosphatase: 88 U/L (ref 38–126)
Anion gap: 5 (ref 5–15)
BUN: 19 mg/dL (ref 8–23)
CO2: 22 mmol/L (ref 22–32)
Calcium: 8.4 mg/dL — ABNORMAL LOW (ref 8.9–10.3)
Chloride: 113 mmol/L — ABNORMAL HIGH (ref 98–111)
Creatinine, Ser: 1.58 mg/dL — ABNORMAL HIGH (ref 0.44–1.00)
GFR, Estimated: 35 mL/min — ABNORMAL LOW (ref >60)
Glucose, Bld: 156 mg/dL — ABNORMAL HIGH (ref 70–99)
Potassium: 4.1 mmol/L (ref 3.5–5.1)
Sodium: 140 mmol/L (ref 135–145)
Total Bilirubin: 0.6 mg/dL (ref 0.3–1.2)
Total Protein: 5.4 g/dL — ABNORMAL LOW (ref 6.5–8.1)

## 2023-03-09 LAB — GLUCOSE, CAPILLARY
Glucose-Capillary: 147 mg/dL — ABNORMAL HIGH (ref 70–99)
Glucose-Capillary: 144 mg/dL — ABNORMAL HIGH (ref 70–99)
Glucose-Capillary: 196 mg/dL — ABNORMAL HIGH (ref 70–99)
Glucose-Capillary: 240 mg/dL — ABNORMAL HIGH (ref 70–99)

## 2023-03-09 LAB — VITAMIN B1: Vitamin B1 (Thiamine): 85.6 nmol/L (ref 66.5–200.0)

## 2023-03-09 LAB — VITAMIN D 25 HYDROXY (VIT D DEFICIENCY, FRACTURES): Vit D, 25-Hydroxy: 23.78 ng/mL — ABNORMAL LOW (ref 30–100)

## 2023-03-09 LAB — MAGNESIUM: Magnesium: 1.7 mg/dL (ref 1.7–2.4)

## 2023-03-09 MED ORDER — INSULIN GLARGINE-YFGN 100 UNIT/ML ~~LOC~~ SOLN
24.0000 [IU] | Freq: Every day | SUBCUTANEOUS | Status: DC
  Administered 2023-03-10: 24 [IU] via SUBCUTANEOUS
  Filled 2023-03-09: qty 0.24

## 2023-03-09 NOTE — Progress Notes (Addendum)
Inpatient Rehabilitation Admission Medication Review by a Pharmacist  A complete drug regimen review was completed for this patient to identify any potential clinically significant medication issues.  High Risk Drug Classes Is patient taking? Indication by Medication  Antipsychotic No Compazine - prn N/V  Anticoagulant Yes Lovenox - VTE ppx  Antibiotic No   Opioid Yes Vicodin - prn pain  Antiplatelet Yes Clopidogrel, aspirin x 21 days then aspirin alone - CVA  Hypoglycemics/insulin Yes Insulin - DM  Vasoactive Medication Yes Amlodipine - HTN Pirfenidone- pulmonary fibrosis Entresto - CHF, HTN  Chemotherapy No   Other Yes Gabapentin - pain Pantoprazole - Reflux Rosuvastatin - HLD Sertraline, Effexor - mood Trazodone - sleep Albuterol - prn SOB     Type of Medication Issue Identified Description of Issue Recommendation(s)  Drug Interaction(s) (clinically significant)     Duplicate Therapy     Allergy     No Medication Administration End Date     Incorrect Dose     Additional Drug Therapy Needed     Significant med changes from prior encounter (inform family/care partners about these prior to discharge).    Other       Clinically significant medication issues were identified that warrant physician communication and completion of prescribed/recommended actions by midnight of the next day:  No  Name of provider notified for urgent issues identified:   Provider Method of Notification:     Pharmacist comments: None  Time spent performing this drug regimen review (minutes):  20 minutes  Thank you Okey Regal, PharmD

## 2023-03-09 NOTE — Evaluation (Signed)
Speech Language Pathology Assessment and Plan  Patient Details  Name: Erica [name] MRN: [id number] Date of Birth: [birth year]  SLP Diagnosis: Dysarthria;Cognitive Impairments  Rehab Potential: Excellent ELOS: 5-7 days    Today's Date: 03/09/2023 SLP Individual Time: 1000-1100 SLP Individual Time Calculation (min): 60 min   Hospital Problem: Principal Problem:   Stroke (cerebrum) [facility])  Past Medical History:  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   GERD (gastroesophageal reflux disease)    with chronic cough   Headache(784.0)    "mild"   Hypertension    Loop Biotronik loop implant 12/16/2020 12/16/2020   Mental disorder    depression   Neuropathy    feet    Pain    arthritis pain - takes tramadol as needed   Pulmonary fibrosis, unspecified (HCC)    Rectal cancer (HCC)    Rectal polyp    very little bleeding with bowel movements- no pain   Stroke (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   Suicide attempt [facility])    Past Surgical History:  Past Surgical History:  Procedure Laterality Date   ANAL RECTAL MANOMETRY N/A 07/13/2016   Procedure: ANO RECTAL MANOMETRY;  Surgeon: Romie Levee, MD;  Location: [facility] ENDOSCOPY;  Service: Endoscopy;  Laterality: N/A;   CESAREAN SECTION     EUS N/A 11/21/2012   Procedure: LOWER ENDOSCOPIC ULTRASOUND (EUS);  Surgeon: Willis Modena, MD;  Location: [facility];  Service: Endoscopy;  Laterality: N/A;   FLEXIBLE SIGMOIDOSCOPY N/A 11/21/2012   Procedure: FLEXIBLE SIGMOIDOSCOPY;  Surgeon: Willis Modena, MD;  Location: [facility] ENDOSCOPY;  Service: Endoscopy;  Laterality: N/A;   FLEXIBLE SIGMOIDOSCOPY N/A 01/28/2013   Procedure: FLEXIBLE SIGMOIDOSCOPY;  Surgeon: Romie Levee, MD;  Location: [facility] ENDOSCOPY;  Service: Endoscopy;  Laterality: N/A;   LAPAROSCOPIC LOW ANTERIOR RESECTION N/A 01/29/2013    Procedure: LAPAROSCOPIC LOW ANTERIOR RESECTION, Rigid Proctoscopy;  Surgeon: Romie Levee, MD;  Location: [facility] ORS;  Service: General;  Laterality: N/A;   LAPAROSCOPIC SIGMOID COLECTOMY N/A 11/14/2012   Procedure: DIAGNOSTIC LAPAROSCOPY AND SIGMOIDMOIDOSCOPY ;  Surgeon: Emelia Loron, MD;  Location: [facility] ORS;  Service: General;  Laterality: N/A;   RECTAL ULTRASOUND N/A 07/13/2016   Procedure: RECTAL ULTRASOUND;  Surgeon: Romie Levee, MD;  Location: [facility] ENDOSCOPY;  Service: Endoscopy;  Laterality: N/A;   TONSILLECTOMY     TONSILLECTOMY AND ADENOIDECTOMY      Assessment / Plan / Recommendation Clinical Impression HPI: Pt is a 67 year old female with medical hx significant for: prior CVA with residual left-sided weakness, IDDM, depression, anxiety, hyperlipidemia, GERD. Pt presented to [facility] on 03/04/23 d/t AMS. CT negative for acute abnormalities. MRI showed small acute to subacute infarcts in bilateral centrum semiovale without hemorrhage or mass effect.   Clinical Impression:  Bedside Swallow Evaluation: Patient presents with s/sx of functional oropharyngeal swallow. Oral mechanism exam WFL. Boluses administered included thin liquids, purees, and solids. No s/sx of aspiration present and clear VC throughout. Mastication of solids timely with adequate oral clearance. Recommend continuation of regular/thin diet and use of standardized precautions (small bites/sips, slow rate, sit upright at 90 degrees).  Communication: No deficits noted in expressive/receptive language Cognition: Patient evaluated via the Cognistat to assess cognitive - linguistic skills. Patient scored WFL on all subtests with the exception of visual construction, delayed recall and calculations  therefore demonstrating deficits in problem solving and memory.   Dysarthria: Patient presents with mild dysarthria characterized by low vocal quality and imprecise articulation resulting in ~85% intelligibility at the  conversational level. Patient reports occasional stuttering, though none observed during evaluation.  Pt would benefit from skilled ST services to maximize cognitive skills and speech intelligibility in order to maximize functional independence at d/c. Anticipate patient will require 24 hour supervision at d/c and f/u SLP services.    Skilled Therapeutic Interventions          Patient evaluated using a standardized cognitive linguistic assessment and bedside swallow evaluation to assess current cognitive, communicative and swallowing function. See above for details.   SLP Assessment  Patient will need skilled Speech Lanaguage Pathology Services during CIR admission    Recommendations  SLP Diet Recommendations: Age appropriate regular solids;Thin Liquid Administration via: Cup Medication Administration: Whole meds with liquid Supervision: Patient able to self feed Compensations: Minimize environmental distractions;Slow rate;Small sips/bites Postural Changes and/or Swallow Maneuvers: Seated upright 90 degrees Oral Care Recommendations: Oral care BID Patient destination: Home Follow up Recommendations: None Equipment Recommended: None recommended by SLP    SLP Frequency 1 to 3 out of 7 days   SLP Duration  SLP Intensity  SLP Treatment/Interventions 5-7 days  Minumum of 1-2 x/day, 30 to 90 minutes  Cognitive remediation/compensation;Internal/external aids;Cueing hierarchy;Environmental controls;Therapeutic Activities;Functional tasks;Patient/family education;Therapeutic Exercise    Pain Pain Assessment Pain Score: 0-No pain  Prior Functioning Type of Home: House  Lives With: Family Available Help at Discharge: Family;Available 24 hours/day  SLP Evaluation Cognition Overall Cognitive Status: Impaired/Different from baseline Arousal/Alertness: Awake/alert Orientation Level: Oriented X4 Year: 2024 Month: September Day of Week: Incorrect Memory: Impaired Memory Impairment:  Storage deficit;Retrieval deficit;Decreased short term memory Decreased Short Term Memory: Verbal basic;Functional basic Awareness: Appears intact Problem Solving: Impaired Problem Solving Impairment: Verbal complex;Functional complex Safety/Judgment: Appears intact  Comprehension Auditory Comprehension Overall Auditory Comprehension: Appears within functional limits for tasks assessed Expression Expression Primary Mode of Expression: Verbal Verbal Expression Overall Verbal Expression: Appears within functional limits for tasks assessed Oral Motor Oral Motor/Sensory Function Overall Oral Motor/Sensory Function: Within functional limits Motor Speech Overall Motor Speech: Impaired Respiration: Impaired Phonation: Low vocal intensity Resonance: Within functional limits Articulation: Impaired Level of Impairment: Conversation Intelligibility: Intelligibility reduced Conversation: 75-100% accurate Motor Planning: Witnin functional limits Motor Speech Errors: Not applicable Effective Techniques: Slow rate;Increased vocal intensity  Care Tool Care Tool Cognition Ability to hear (with hearing aid or hearing appliances if normally used Ability to hear (with hearing aid or hearing appliances if normally used): 0. Adequate - no difficulty in normal conservation, social interaction, listening to TV   Expression of Ideas and Wants Expression of Ideas and Wants: 4. Without difficulty (complex and basic) - expresses complex messages without difficulty and with speech that is clear and easy to understand   Understanding Verbal and Non-Verbal Content Understanding Verbal and Non-Verbal Content: 4. Understands (complex and basic) - clear comprehension without cues or repetitions  Memory/Recall Ability Memory/Recall Ability : Current season;Staff names and faces;That he or she is in a hospital/hospital unit  Bedside Swallowing Assessment General Previous Swallow Assessment: none Diet Prior to  this Study: Regular;Thin liquids (Level 0) Respiratory Status: Room air Behavior/Cognition: Alert;Cooperative;Pleasant mood Oral Cavity - Dentition: Adequate natural dentition Self-Feeding Abilities: Able to feed self;Needs set up Patient Positioning: Upright in bed Baseline Vocal Quality: Low vocal intensity Volitional Cough: Strong Volitional Swallow: Able to elicit  Ice Chips Ice chips: Not tested Thin  Liquid Thin Liquid: Within functional limits Presentation: Self Fed;Straw;Cup Other Comments: perfers no straws Nectar Thick Nectar Thick Liquid: Not tested Honey Thick Honey Thick Liquid: Not tested Puree Puree: Within functional limits Presentation: Self Fed;Spoon Solid Solid: Within functional limits Presentation: Self Fed BSE Assessment Risk for Aspiration Impact on safety and function: Mild aspiration risk Other Related Risk Factors: Previous CVA  Short Term Goals: Week 1: SLP Short Term Goal 1 (Week 1): STG=LTG due to ELOS  Refer to Care Plan for Long Term Goals  Recommendations for other services: None   Discharge Criteria: Patient will be discharged from SLP if patient refuses treatment 3 consecutive times without medical reason, if treatment goals not met, if there is a change in medical status, if patient makes no progress towards goals or if patient is discharged from hospital.  The above assessment, treatment plan, treatment alternatives and goals were discussed and mutually agreed upon: by patient  Jatavia Keltner M.A., CF-SLP 03/09/2023, 12:46 PM

## 2023-03-09 NOTE — Plan of Care (Signed)
Problem: RH Balance Goal: LTG Patient will maintain dynamic standing with ADLs (OT) Description: LTG:  Patient will maintain dynamic standing balance with assist during activities of daily living (OT)  Flowsheets (Taken 03/09/2023 0946) LTG: Pt will maintain dynamic standing balance during ADLs with: Independent with assistive device   Problem: Sit to Stand Goal: LTG:  Patient will perform sit to stand in prep for activites of daily living with assistance level (OT) Description: LTG:  Patient will perform sit to stand in prep for activites of daily living with assistance level (OT) Flowsheets (Taken 03/09/2023 0946) LTG: PT will perform sit to stand in prep for activites of daily living with assistance level: Independent   Problem: RH Grooming Goal: LTG Patient will perform grooming w/assist,cues/equip (OT) Description: LTG: Patient will perform grooming with assist, with/without cues using equipment (OT) Flowsheets (Taken 03/09/2023 0946) LTG: Pt will perform grooming with assistance level of: Independent   Problem: RH Bathing Goal: LTG Patient will bathe all body parts with assist levels (OT) Description: LTG: Patient will bathe all body parts with assist levels (OT) Flowsheets (Taken 03/09/2023 0946) LTG: Pt will perform bathing with assistance level/cueing: Set up assist  LTG: Position pt will perform bathing: Shower   Problem: RH Dressing Goal: LTG Patient will perform upper body dressing (OT) Description: LTG Patient will perform upper body dressing with assist, with/without cues (OT). Flowsheets (Taken 03/09/2023 0946) LTG: Pt will perform upper body dressing with assistance level of: Independent Goal: LTG Patient will perform lower body dressing w/assist (OT) Description: LTG: Patient will perform lower body dressing with assist, with/without cues in positioning using equipment (OT) Flowsheets (Taken 03/09/2023 0946) LTG: Pt will perform lower body dressing with assistance level  of: Independent with assistive device   Problem: RH Toileting Goal: LTG Patient will perform toileting task (3/3 steps) with assistance level (OT) Description: LTG: Patient will perform toileting task (3/3 steps) with assistance level (OT)  Flowsheets (Taken 03/09/2023 0946) LTG: Pt will perform toileting task (3/3 steps) with assistance level: Independent with assistive device   Problem: RH Simple Meal Prep Goal: LTG Patient will perform simple meal prep w/assist (OT) Description: LTG: Patient will perform simple meal prep with assistance, with/without cues (OT). Flowsheets (Taken 03/09/2023 0946) LTG: Pt will perform simple meal prep with assistance level of: Independent with assistive device LTG: Pt will perform simple meal prep w/level of: Ambulate with device   Problem: RH Laundry Goal: LTG Patient will perform laundry w/assist, cues (OT) Description: LTG: Patient will perform laundry with assistance, with/without cues (OT). Flowsheets (Taken 03/09/2023 0946) LTG: Pt will perform laundry with assistance level of: Independent with assistive device LTG: Pt will perform laundry with level of: Ambulate with device   Problem: RH Light Housekeeping Goal: LTG Patient will perform light housekeeping w/assist (OT) Description: LTG: Patient will perform light housekeeping with assistance, with/without cues (OT). Flowsheets (Taken 03/09/2023 0946) LTG: Pt will perform light housekeeping with assistance level of: Independent with assistive device LTG: Pt will perform light housekeeping w/level of: Ambulate with device   Problem: RH Toilet Transfers Goal: LTG Patient will perform toilet transfers w/assist (OT) Description: LTG: Patient will perform toilet transfers with assist, with/without cues using equipment (OT) Flowsheets (Taken 03/09/2023 0946) LTG: Pt will perform toilet transfers with assistance level of: Independent with assistive device   Problem: RH Tub/Shower Transfers Goal: LTG  Patient will perform tub/shower transfers w/assist (OT) Description: LTG: Patient will perform tub/shower transfers with assist, with/without cues using equipment (OT) Flowsheets (Taken 03/09/2023  5621) LTG: Pt will perform tub/shower stall transfers with assistance level of: Set up assist LTG: Pt will perform tub/shower transfers from: Walk in shower

## 2023-03-09 NOTE — Progress Notes (Signed)
Patient had a quite night. Denies pain, SOB or headaches. Vital signs stable. Assisted to bathroom. Incontinent/continent  of bladder. Educated on asking for help to go to the bathroom. May benefit timed toilet if episode happens again. Safety maintained at all times.

## 2023-03-09 NOTE — Plan of Care (Signed)
  Problem: RH Balance Goal: LTG Patient will maintain dynamic standing balance (PT) Description: LTG:  Patient will maintain dynamic standing balance with assistance during mobility activities (PT) Flowsheets (Taken 03/09/2023 1344) LTG: Pt will maintain dynamic standing balance during mobility activities with:: Supervision/Verbal cueing   Problem: Sit to Stand Goal: LTG:  Patient will perform sit to stand with assistance level (PT) Description: LTG:  Patient will perform sit to stand with assistance level (PT) Flowsheets (Taken 03/09/2023 1344) LTG: PT will perform sit to stand in preparation for functional mobility with assistance level: Independent with assistive device   Problem: RH Bed Mobility Goal: LTG Patient will perform bed mobility with assist (PT) Description: LTG: Patient will perform bed mobility with assistance, with/without cues (PT). Flowsheets (Taken 03/09/2023 1344) LTG: Pt will perform bed mobility with assistance level of: Independent with assistive device    Problem: RH Bed to Chair Transfers Goal: LTG Patient will perform bed/chair transfers w/assist (PT) Description: LTG: Patient will perform bed to chair transfers with assistance (PT). Flowsheets (Taken 03/09/2023 1344) LTG: Pt will perform Bed to Chair Transfers with assistance level: Independent with assistive device    Problem: RH Car Transfers Goal: LTG Patient will perform car transfers with assist (PT) Description: LTG: Patient will perform car transfers with assistance (PT). Flowsheets (Taken 03/09/2023 1344) LTG: Pt will perform car transfers with assist:: Supervision/Verbal cueing   Problem: RH Ambulation Goal: LTG Patient will ambulate in controlled environment (PT) Description: LTG: Patient will ambulate in a controlled environment, # of feet with assistance (PT). Flowsheets (Taken 03/09/2023 1344) LTG: Pt will ambulate in controlled environ  assist needed:: Supervision/Verbal cueing LTG: Ambulation  distance in controlled environment: 179ft Goal: LTG Patient will ambulate in home environment (PT) Description: LTG: Patient will ambulate in home environment, # of feet with assistance (PT). Flowsheets (Taken 03/09/2023 1344) LTG: Pt will ambulate in home environ  assist needed:: Supervision/Verbal cueing LTG: Ambulation distance in home environment: 68ft   Problem: RH Stairs Goal: LTG Patient will ambulate up and down stairs w/assist (PT) Description: LTG: Patient will ambulate up and down # of stairs with assistance (PT) Flowsheets (Taken 03/09/2023 1344) LTG: Pt will ambulate up/down stairs assist needed:: Contact Guard/Touching assist LTG: Pt will  ambulate up and down number of stairs: at least 4 with 2 hand rails

## 2023-03-09 NOTE — Progress Notes (Signed)
Inpatient Rehabilitation  Patient information reviewed and entered into eRehab system by Demarrio Menges Nikya Busler, OTR/L, Rehab Quality Coordinator.   Information including medical coding, functional ability and quality indicators will be reviewed and updated through discharge.   

## 2023-03-09 NOTE — Plan of Care (Signed)
  Problem: RH Expression Communication Goal: LTG Patient will increase speech intelligibility (SLP) Description: LTG: Patient will increase speech intelligibility at word/phrase/conversation level with cues, % of the time (SLP) Flowsheets (Taken 03/09/2023 1250) LTG: Patient will increase speech intelligibility (SLP): Supervision Level: Conversation level Percent of time patient will use intelligible speech: 95   Problem: RH Problem Solving Goal: LTG Patient will demonstrate problem solving for (SLP) Description: LTG:  Patient will demonstrate problem solving for basic/complex daily situations with cues  (SLP) Flowsheets (Taken 03/09/2023 1250) LTG: Patient will demonstrate problem solving for (SLP): (mildly complex) Other (comment) LTG Patient will demonstrate problem solving for: Supervision   Problem: RH Memory Goal: LTG Patient will use memory compensatory aids to (SLP) Description: LTG:  Patient will use memory compensatory aids to recall biographical/new, daily complex information with cues (SLP) Flowsheets (Taken 03/09/2023 1250) LTG: Patient will use memory compensatory aids to (SLP): Supervision

## 2023-03-09 NOTE — Progress Notes (Signed)
Inpatient Rehabilitation Center Individual Statement of Services  Patient Name:  Erica [name]  Date:  03/09/2023  Welcome to the Inpatient Rehabilitation Center.  Our goal is to provide you with an individualized program based on your diagnosis and situation, designed to meet your specific needs.  With this comprehensive rehabilitation program, you will be expected to participate in at least 3 hours of rehabilitation therapies Monday-Friday, with modified therapy programming on the weekends.  Your rehabilitation program will include the following services:  Physical Therapy (PT), Occupational Therapy (OT), Speech Therapy (ST), 24 hour per day rehabilitation nursing, Therapeutic Recreaction (TR), Neuropsychology, Care Coordinator, Rehabilitation Medicine, Nutrition Services, Pharmacy Services, and Other  Weekly team conferences will be held on Wesnesdays to discuss your progress.  Your Inpatient Rehabilitation Care Coordinator will talk with you frequently to get your input and to update you on team discussions.  Team conferences with you and your family in attendance may also be held.  Expected length of stay: 7-10 Days  Overall anticipated outcome:  Supervision  Depending on your progress and recovery, your program may change. Your Inpatient Rehabilitation Care Coordinator will coordinate services and will keep you informed of any changes. Your Inpatient Rehabilitation Care Coordinator's name and contact numbers are listed  below.  The following services may also be recommended but are not provided by the Inpatient Rehabilitation Center:   Home Health Rehabiltiation Services Outpatient Rehabilitation Services    Arrangements will be made to provide these services after discharge if needed.  Arrangements include referral to agencies that provide these services.  Your insurance has been verified to be:  [employer] Your primary doctor is:  Fleet Contras, MD  Pertinent information will  be shared with your doctor and your insurance company.  Inpatient Rehabilitation Care Coordinator:  Lavera Guise, [place] [phone] or [phone]  Information discussed with and copy given to patient by: Andria Rhein, 03/09/2023, 11:39 AM

## 2023-03-09 NOTE — Evaluation (Signed)
Physical Therapy Assessment and Plan  Patient Details  Name: Erica [name] MRN: [id number] Date of Birth: 22-Sep-1955  PT Diagnosis: Abnormality of gait, Difficulty walking, Hemiplegia non-dominant, Impaired cognition, Impaired sensation, and Muscle weakness Rehab Potential: Good ELOS: 4-7 days   Today's Date: 03/09/2023 PT Individual Time: 4098-1191 PT Individual Time Calculation (min): 45 min  and Today's Date: 03/09/2023 PT Missed Time: 30 Minutes (orthostatic) Missed Time Reason: Patient ill (Comment)   Hospital Problem: Principal Problem:   Stroke (cerebrum) [facility])   Past Medical History:  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   GERD (gastroesophageal reflux disease)    with chronic cough   Headache(784.0)    "mild"   Hypertension    Loop Biotronik loop implant 12/16/2020 12/16/2020   Mental disorder    depression   Neuropathy    feet    Pain    arthritis pain - takes tramadol as needed   Pulmonary fibrosis, unspecified (HCC)    Rectal cancer (HCC)    Rectal polyp    very little bleeding with bowel movements- no pain   Stroke (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   Suicide attempt [facility])    Past Surgical History:  Past Surgical History:  Procedure Laterality Date   ANAL RECTAL MANOMETRY N/A 07/13/2016   Procedure: ANO RECTAL MANOMETRY;  Surgeon: Romie Levee, MD;  Location: [facility] ENDOSCOPY;  Service: Endoscopy;  Laterality: N/A;   CESAREAN SECTION     EUS N/A 11/21/2012   Procedure: LOWER ENDOSCOPIC ULTRASOUND (EUS);  Surgeon: Willis Modena, MD;  Location: [facility];  Service: Endoscopy;  Laterality: N/A;   FLEXIBLE SIGMOIDOSCOPY N/A 11/21/2012   Procedure: FLEXIBLE SIGMOIDOSCOPY;  Surgeon: Willis Modena, MD;  Location: [facility] ENDOSCOPY;  Service: Endoscopy;  Laterality: N/A;   FLEXIBLE SIGMOIDOSCOPY N/A 01/28/2013    Procedure: FLEXIBLE SIGMOIDOSCOPY;  Surgeon: Romie Levee, MD;  Location: [facility] ENDOSCOPY;  Service: Endoscopy;  Laterality: N/A;   LAPAROSCOPIC LOW ANTERIOR RESECTION N/A 01/29/2013   Procedure: LAPAROSCOPIC LOW ANTERIOR RESECTION, Rigid Proctoscopy;  Surgeon: Romie Levee, MD;  Location: [facility] ORS;  Service: General;  Laterality: N/A;   LAPAROSCOPIC SIGMOID COLECTOMY N/A 11/14/2012   Procedure: DIAGNOSTIC LAPAROSCOPY AND SIGMOIDMOIDOSCOPY ;  Surgeon: Emelia Loron, MD;  Location: [facility] ORS;  Service: General;  Laterality: N/A;   RECTAL ULTRASOUND N/A 07/13/2016   Procedure: RECTAL ULTRASOUND;  Surgeon: Romie Levee, MD;  Location: [facility] ENDOSCOPY;  Service: Endoscopy;  Laterality: N/A;   TONSILLECTOMY     TONSILLECTOMY AND ADENOIDECTOMY      Assessment & Plan Clinical Impression: Patient is a 67 year old female with history of HTN, T2DM, depression, CVA with residual left sided deficits, rectal cancer, GERD, COPD/emphysema, pulmonary fibrosis with baseline dyspnea and chronic cough who was admitted to [facility] on 03/04/23 with  mental status changes and difficulty arousing patient from sleep. She was found to have small acute to subacute infarcts in bilateral centrumovale.  Dr. Iver Nestle evaluated patient and felt that stroke likely due to small vessel disease. 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 Carotid ultrasound without significant ICA stenosis. MRA without occlusion or significant stenosis.  2 D echo showed EF- 65-70/5 without wall abnormality. She was started on DAPT X 21 days to  be followed by ASA alone--does have a loop recorder to rule out A fib.  Vitamin B/thiamine WNL.HIV, RPR, ANCA negative and mild elevation in ESR (normal CRP) not felt to be infectious etiology.    Patient with ataxic gait, fatigue, cognitive deficits and decrease control of LLE w/balance deficits. Therapy has been  working with patient who requires min to mod assist with ADLs and mobility. CIR recommended due to functional decline. C/o neuropathy in feet and left hand.  Patient transferred to CIR on 03/08/2023 .   Patient currently requires  CGA  with mobility secondary to muscle weakness and muscle joint tightness, decreased cardiorespiratoy endurance, decreased awareness, decreased problem solving, and decreased memory, and decreased standing balance, hemiplegia, and decreased balance strategies.  Prior to hospitalization, patient was modified independent  with mobility and lived with Family in a House home.  Home access is  Level entry.  Patient will benefit from skilled PT intervention to maximize safe functional mobility, minimize fall risk, and decrease caregiver burden for planned discharge home with 24 hour supervision.  Anticipate patient will benefit from follow up OP at discharge.  PT - End of Session Activity Tolerance: Tolerates < 10 min activity, no significant change in vital signs Endurance Deficit: Yes PT Assessment Rehab Potential (ACUTE/IP ONLY): Good PT Barriers to Discharge: Decreased caregiver support;Insurance for SNF coverage;Lack of/limited family support PT Patient demonstrates impairments in the following area(s): Balance;Endurance;Motor;Safety;Sensory PT Transfers Functional Problem(s): Bed Mobility;Bed to Chair;Car PT Locomotion Functional Problem(s): Ambulation;Stairs PT Plan PT Intensity: Minimum of 1-2 x/day ,45 to 90 minutes PT Frequency: 5 out of 7 days PT Duration Estimated Length of Stay: 4-7 days PT Treatment/Interventions: Ambulation/gait training;Discharge planning;Functional mobility training;Psychosocial support;Therapeutic Activities;Visual/perceptual remediation/compensation;Wheelchair propulsion/positioning;Therapeutic Exercise;Skin care/wound management;Neuromuscular re-education;Disease management/prevention;Balance/vestibular training;Cognitive  remediation/compensation;Splinting/orthotics;Pain management;DME/adaptive equipment instruction;UE/LE Strength taining/ROM;UE/LE Coordination activities;Stair training;Functional electrical stimulation;Community reintegration;Patient/family education PT Transfers Anticipated Outcome(s): mod I PT Locomotion Anticipated Outcome(s): supervision PT Recommendation Recommendations for Other Services: Neuropsych consult Follow Up Recommendations: 24 hour supervision/assistance;Outpatient PT Patient destination: Home Equipment Recommended: To be determined   PT Evaluation Precautions/Restrictions Precautions Precautions: Fall Restrictions Weight Bearing Restrictions: No Pain   Pain Interference Pain Interference Pain Effect on Sleep: 0. Does not apply - I have not had any pain or hurting in the past 5 days Pain Interference with Therapy Activities: 0. Does not apply - I have not received rehabilitationtherapy in the past 5 days Pain Interference with Day-to-Day Activities: 1. Rarely or not at all Home Living/Prior Functioning Home Living Available Help at Discharge: Family;Available 24 hours/day Type of Home: House Home Access: Level entry Home Layout: One level Bathroom Shower/Tub: Tub only Firefighter: Handicapped height Bathroom Accessibility: Yes Additional Comments: uses shower at nieces house next door, planning to move into senior living apartment  Lives With: Family Prior Function Level of Independence: Requires assistive device for independence  Able to Take Stairs?: Yes Driving: No Vision/Perception  Vision - History Ability to See in Adequate Light: 1 Impaired Perception Perception: Within Functional Limits Praxis Praxis: WFL  Cognition Overall Cognitive Status: Impaired/Different from baseline (reports new STM deficits) Arousal/Alertness: Awake/alert Orientation Level: Oriented X4 Year: 2024 Month: September Day of Week: Incorrect Attention:  Focused;Sustained;Selective Focused Attention: Appears intact Sustained Attention: Appears intact Selective Attention: Appears intact Memory: Impaired Memory Impairment: Storage deficit;Retrieval deficit;Decreased short term memory Decreased Short Term Memory: Verbal basic;Functional basic Awareness: Appears intact Problem Solving: Impaired Problem Solving Impairment: Verbal complex;Functional complex Safety/Judgment: Appears intact Sensation Sensation Light Touch: Impaired by gross assessment Light Touch Impaired Details:  Impaired LUE Hot/Cold: Appears Intact Proprioception: Appears Intact Stereognosis: Appears Intact Additional Comments: pt reports numbness in L hand and feet (pt has had impaired sensation since last CVA 2 yrs ago) Coordination Gross Motor Movements are Fluid and Coordinated: No Fine Motor Movements are Fluid and Coordinated: Yes Coordination and Movement Description: able to fasten bra, open containers Motor  Motor Motor: Hemiplegia Motor - Skilled Clinical Observations: mild L hemiplegia   Trunk/Postural Assessment  Cervical Assessment Cervical Assessment: Within Functional Limits Thoracic Assessment Thoracic Assessment: Within Functional Limits Lumbar Assessment Lumbar Assessment: Within Functional Limits Postural Control Postural Control: Within Functional Limits  Balance Balance Balance Assessed: Yes Static Sitting Balance Static Sitting - Balance Support: Feet supported;No upper extremity supported Static Sitting - Level of Assistance: 7: Independent Dynamic Sitting Balance Dynamic Sitting - Balance Support: Feet supported;No upper extremity supported Dynamic Sitting - Level of Assistance: 5: Stand by assistance Static Standing Balance Static Standing - Balance Support: Bilateral upper extremity supported Static Standing - Level of Assistance: 5: Stand by assistance Dynamic Standing Balance Dynamic Standing - Balance Support: Bilateral upper  extremity supported;During functional activity Dynamic Standing - Level of Assistance: 4: Min assist Extremity Assessment      RLE Assessment RLE Assessment: Exceptions to [facility] General Strength Comments: Grossly 4/5 LLE Assessment LLE Assessment: Exceptions to [facility] General Strength Comments: Grossly 3+/5 LLE Strength LLE Overall Strength: Deficits  Care Tool Care Tool Bed Mobility Roll left and right activity   Roll left and right assist level: Independent    Sit to lying activity   Sit to lying assist level: Supervision/Verbal cueing    Lying to sitting on side of bed activity   Lying to sitting on side of bed assist level: the ability to move from lying on the back to sitting on the side of the bed with no back support.: Supervision/Verbal cueing     Care Tool Transfers Sit to stand transfer   Sit to stand assist level: Supervision/Verbal cueing    Chair/bed transfer   Chair/bed transfer assist level: Contact Guard/Touching assist     Scientist, research (physical sciences) transfer activity did not occur: Safety/medical concerns        Care Tool Locomotion Ambulation   Assist level: Contact Guard/Touching assist Assistive device: Walker-rolling Max distance: 22ft  Walk 10 feet activity   Assist level: Contact Guard/Touching assist Assistive device: Walker-rolling   Walk 50 feet with 2 turns activity Walk 50 feet with 2 turns activity did not occur: Safety/medical concerns (orthostatic)      Walk 150 feet activity Walk 150 feet activity did not occur: Safety/medical concerns      Walk 10 feet on uneven surfaces activity Walk 10 feet on uneven surfaces activity did not occur: Safety/medical concerns      Stairs Stair activity did not occur: Safety/medical concerns        Walk up/down 1 step activity Walk up/down 1 step or curb (drop down) activity did not occur: Safety/medical concerns      Walk up/down 4 steps activity Walk up/down 4 steps activity did  not occur: Safety/medical concerns      Walk up/down 12 steps activity Walk up/down 12 steps activity did not occur: Safety/medical concerns      Pick up small objects from floor Pick up small object from the floor (from standing position) activity did not occur: Safety/medical concerns      Wheelchair Is the patient using a wheelchair?: Yes  Type of Wheelchair: Manual Wheelchair activity did not occur: Safety/medical concerns      Wheel 50 feet with 2 turns activity Wheelchair 50 feet with 2 turns activity did not occur: Safety/medical concerns    Wheel 150 feet activity Wheelchair 150 feet activity did not occur: Safety/medical concerns      Refer to Care Plan for Long Term Goals  SHORT TERM GOAL WEEK 1 PT Short Term Goal 1 (Week 1): = LTG due to ELOS  Recommendations for other services: Neuropsych  Skilled Therapeutic Intervention Mobility Bed Mobility Bed Mobility: Sit to Supine;Supine to Sit;Rolling Left;Rolling Right Rolling Right: Supervision/verbal cueing Rolling Left: Supervision/Verbal cueing Supine to Sit: Supervision/Verbal cueing Sit to Supine: Supervision/Verbal cueing Transfers Transfers: Sit to Stand;Stand to Sit;Stand Pivot Transfers Sit to Stand: Contact Guard/Touching assist Stand to Sit: Contact Guard/Touching assist Stand Pivot Transfers: Contact Guard/Touching assist Stand Pivot Transfer Details: Verbal cues for precautions/safety;Verbal cues for gait pattern;Tactile cues for posture Transfer (Assistive device): None Locomotion  Gait Ambulation: Yes Gait Assistance: Contact Guard/Touching assist Gait Distance (Feet): 20 Feet Assistive device: Rolling walker Gait Gait: Yes Gait Pattern: Impaired Gait Pattern: Decreased step length - left;Decreased stance time - left;Decreased hip/knee flexion - left;Decreased dorsiflexion - left;Decreased weight shift to left;Left flexed knee in stance;Lateral trunk lean to right;Poor foot clearance - left;Trunk  flexed Stairs / Additional Locomotion Stairs: No Wheelchair Mobility Wheelchair Mobility: No  Skilled Intervention: Pt greeted in bed - reports feeling fatigued but denies any pain. Agreeable to PT evaluation, flat affect throughout. Pt reporting need to use the bathroom before leaving her room. Bed mobility completed with supervision and pt reporting initial dizziness on sitting. Resolves within a few minutes of rest. Sit<>stand with CGA using RW and ambulates with CGA and RW to the bathroom - pt continent x2 and completes 3/3 toileting tasks without assist. Returned to the sink to wash hands in standing and fair standing balance with no knee buckling or LOB in unsupported static standing. Once sitting back on the bed, pt reporting faint and need to lie down. Orthostatics taken: 135/71  116/74  92/61  *pt symptomatic, care team notified via secure chat.  After this, pt unwilling to participate in further assessment due to feeling faint and fatigued. Offered compression socks and frequent rest breaks but patient still refused, wanting to rest. She missed the remaining 30 minutes of PT session. Left in bed with all needs met, alarm on.   Pt presents primarily with deficits in dynamic standing balance, L sided weakness (premorbid), generalized weakness and deconditioning. Anticipate patient will do well with skilled PT POC as described above to address these impairments and progress towards safe DC home with her sister in 1 level home and to resume OP therapies in follow up.   Discharge Criteria: Patient will be discharged from PT if patient refuses treatment 3 consecutive times without medical reason, if treatment goals not met, if there is a change in medical status, if patient makes no progress towards goals or if patient is discharged from hospital.  The above assessment, treatment plan, treatment alternatives and goals were discussed and mutually agreed upon: by patient  Orrin Brigham   PT, DPT, CSRS  03/09/2023, 1:47 PM

## 2023-03-09 NOTE — Progress Notes (Signed)
PROGRESS NOTE   Subjective/Complaints:    Objective:   No results found. Recent Labs    03/09/23 0653  WBC 4.5  HGB 10.6*  HCT 28.9*  PLT 143*   Recent Labs    03/09/23 0653  NA 140  K 4.1  CL 113*  CO2 22  GLUCOSE 156*  BUN 19  CREATININE 1.58*  CALCIUM 8.4*    Intake/Output Summary (Last 24 hours) at 03/09/2023 0834 Last data filed at 03/08/2023 1755 Gross per 24 hour  Intake 236 ml  Output --  Net 236 ml        Physical Exam: Vital Signs Blood pressure 138/88, pulse 62, temperature 98 F (36.7 C), resp. rate 18, height 5\' 3"  (1.6 m), weight 73.7 kg, SpO2 100%.   General: No acute distress Mood and affect are appropriate Heart: Regular rate and rhythm no rubs murmurs or extra sounds Lungs: Clear to auscultation, breathing unlabored, no rales or wheezes Abdomen: Positive bowel sounds, soft nontender to palpation, nondistended Extremities: No clubbing, cyanosis, or edema Skin: No evidence of breakdown, no evidence of rash Neurologic: Cranial nerves II through XII intact, motor strength is 5/5 in right and 4+ Left deltoid, bicep, tricep, grip, hip flexor, knee extensors, ankle dorsiflexor and plantar flexor Sensory exam normal sensation to light touch  in bilateral upper and lower extremities Cerebellar exam normal finger to nose to finger as well as heel to shin in bilateral upper and lower extremities slower on the left side  Musculoskeletal: Full range of motion in all 4 extremities. No joint swelling   Assessment/Plan: 1. Functional deficits which require 3+ hours per day of interdisciplinary therapy in a comprehensive inpatient rehab setting. Physiatrist is providing close team supervision and 24 hour management of active medical problems listed below. Physiatrist and rehab team continue to assess barriers to discharge/monitor patient progress toward functional and medical goals  Care  Tool:  Bathing              Bathing assist       Upper Body Dressing/Undressing Upper body dressing        Upper body assist      Lower Body Dressing/Undressing Lower body dressing            Lower body assist       Toileting Toileting    Toileting assist       Transfers Chair/bed transfer  Transfers assist           Locomotion Ambulation   Ambulation assist              Walk 10 feet activity   Assist           Walk 50 feet activity   Assist           Walk 150 feet activity   Assist           Walk 10 feet on uneven surface  activity   Assist           Wheelchair     Assist               Wheelchair 50 feet with 2 turns activity  Assist            Wheelchair 150 feet activity     Assist          Blood pressure 138/88, pulse 62, temperature 98 F (36.7 C), resp. rate 18, height 5\' 3"  (1.6 m), weight 73.7 kg, SpO2 100%.  Medical Problem List and Plan: 1. Functional deficits secondary to acute infarcts of bilateral centrum semiovale.              -patient may shower             -ELOS/Goals: 5-7 days             Admit to CIR PT, OT evals today 2.  Antithrombotics: -DVT/anticoagulation:  Pharmaceutical: Lovenox             -antiplatelet therapy: DAPT X 3 weeks followed by ASA alont.  3. Peripheral neuropathy:  tylenol prn. Discussed Qutenza outpatient and she would like to try 4. Mood/Behavior/Sleep: LCSW to follow for evaluation and support             --melatonin prn for insomnia.              -antipsychotic agents: N/A 5. Neuropsych/cognition: This patient is not fully capable of making decisions on her own behalf. 6. Skin/Wound Care: Routine pressure relief measures.  7. Fluids/Electrolytes/Nutrition: Monitor I/O. Check CMET in am. 8. HTN: Monitor BP TID. Continue amlodipine 10 mg daily and Entresto 9. T2DM: Hgb A1C-6.9.  Was on lantus 24 u am/18 u pm              --Increase glargine to 24U, recommended to avoid drinks with added sugar.  CBG (last 3)  Recent Labs    03/08/23 1640 03/08/23 2103 03/09/23 0609  GLUCAP 190* 209* 147*    10 H/o COPD/IPF: Per records has chronic DOE/cough.             --on Pirfenidone TID. No MDI-->followed by Dr. Marchelle Gearing 11. GERD: On protonix  12. H/o Depression/anxiety/SA: Continue Effexor and Zoloft. Check magnesium level tomorrow.    13. Screening for vitamin D deficiency: check vitamin D level tomorrow   14.  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

## 2023-03-09 NOTE — Progress Notes (Signed)
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   GERD (gastroesophageal reflux disease)    with chronic cough   Headache(784.0)    "mild"   Hypertension    Loop Biotronik loop implant 12/16/2020 12/16/2020   Mental disorder    depression   Neuropathy    feet    Pain    arthritis pain - takes tramadol as needed   Pulmonary fibrosis, unspecified (HCC)    Rectal cancer (HCC)    Rectal polyp    very little bleeding with bowel movements- no pain   Stroke (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   Suicide attempt [facility])    Past Surgical History:  Past Surgical History:  Procedure Laterality Date   ANAL RECTAL MANOMETRY N/A 07/13/2016   Procedure: ANO RECTAL MANOMETRY;  Surgeon: Romie Levee, MD;  Location: [facility] END[facility];  Service: Endoscopy;  Laterality: N/A;   CESAREAN SECTION     EUS N/A 11/21/2012   Procedure: LOWER ENDOSCOPIC ULTRASOUND (EUS);  Surgeon: Willis Modena, MD;  Location: [facility];  Service: Endoscopy;  Laterality: N/A;   FLEXIBLE SIGMOIDOSCOPY N/A 11/21/2012   Procedure: FLEXIBLE SIGMOIDOSCOPY;  Surgeon: Willis Modena, MD;  Location: [facility];  Service: Endoscopy;  Laterality: N/A;   FLEXIBLE SIGMOIDOSCOPY N/A 01/28/2013   Procedure: FLEXIBLE SIGMOIDOSCOPY;  Surgeon: Romie Levee, MD;  Location: [facility];  Service: Endoscopy;  Laterality: N/A;   LAPAROSCOPIC LOW ANTERIOR RESECTION N/A 01/29/2013   Procedure: LAPAROSCOPIC LOW ANTERIOR RESECTION, Rigid Proctoscopy;  Surgeon: Romie Levee, MD;  Location: [facility] ORS;  Service: General;  Laterality: N/A;   LAPAROSCOPIC  SIGMOID COLECTOMY N/A 11/14/2012   Procedure: DIAGNOSTIC LAPAROSCOPY AND SIGMOIDMOIDOSCOPY ;  Surgeon: Emelia Loron, MD;  Location: [facility];  Service: General;  Laterality: N/A;   RECTAL ULTRASOUND N/A 07/13/2016   Procedure: RECTAL ULTRASOUND;  Surgeon: Romie Levee, MD;  Location: [facility];  Service: Endoscopy;  Laterality: N/A;   TONSILLECTOMY     TONSILLECTOMY AND ADENOIDECTOMY     Social History:  reports that she quit smoking about 5 years ago. Her smoking use included cigarettes. She started smoking about 25 years ago. She has a 10 pack-year smoking history. She has never used smokeless tobacco. She reports that she does not currently use alcohol. She reports that she does not currently use drugs after having used the following drugs: "Crack" cocaine.  Family / Support Systems Marital Status: Single Patient Roles: Parent Spouse/Significant Other: n/a Children: son, Fayrene Fearing Other Supports: Sister, Acupuncturist and Niece, Engineer, manufacturing Anticipated Caregiver: [name], sister Ability/Limitations of Caregiver: none Caregiver Availability: 24/7 Family Dynamics: support from sister, niece and some other family  Social History Preferred language: English Religion: None Cultural Background: N/A Education: HS Health Literacy - How often do you need to have someone help you when you read instructions, pamphlets, or other written material from your doctor or pharmacy?: Never Writes: Yes Legal History/Current Legal Issues: N/A Guardian/Conservator: N/A   Abuse/Neglect Abuse/Neglect Assessment Can Be Completed: Yes Physical Abuse: Denies Verbal Abuse: Denies Sexual Abuse: Denies Exploitation of patient/patient's resources: Denies Self-Neglect: Denies  Patient response to: Social Isolation -  How often do you feel lonely or isolated from those around you?: Never  Emotional Status Pt's affect, behavior and adjustment status: Pleasant Patient present down/depressed. Recent Psychosocial Issues:  Coping Psychiatric History: hx of depression and mental health history Substance Abuse History: n/a  Patient / Family Perceptions, Expectations & Goals Pt/Family understanding of illness & functional limitations: spoke with pt at bedside Premorbid pt/family roles/activities: Independent overall Anticipated changes in roles/activities/participation: Sister anticpates to assist as primary caregiver 24/7. Sister plans to assist with ADLS Pt/family expectations/goals: Supervision  [employer]: Other (Comment) (HH 3x a week currently .) Premorbid Home Care/DME Agencies: Other (Comment) (SPC and Information systems manager) Transportation available at discharge: Sister and family Is the patient able to respond to transportation needs?: Yes In the past 12 months, has lack of transportation kept you from medical appointments or from getting medications?: No In the past 12 months, has lack of transportation kept you from meetings, work, or from getting things needed for daily living?: No Resource referrals recommended: Neuropsychology  Discharge Planning Living Arrangements: Other relatives (sister) Support Systems: Other relatives Type of Residence: Private residence (1 level home, level entry. Sister plans to complete showers/ADL at daughters home (pit;s niece) directly next door.) Insurance Resources: Media planner (specify) Multimedia programmer Medicare) Financial Resources: Family Support Financial Screen Referred: No Living Expenses: Lives with family Money Management: Patient, Family Does the patient have any problems obtaining your medications?: No Home Management: Independent Patient/Family Preliminary Plans: Assistance from sister Care Coordinator Barriers to Discharge: Insurance for SNF coverage, Inaccessible home environment, Decreased caregiver support, Lack of/limited family support Care Coordinator Anticipated Follow Up Needs: HH/OP Expected length of stay: 7-10  Days  Clinical Impression Sw met with patient, introduced self and explained role. Patient presents with a down/depressed mood.   Patient anticipates discharging back home with her sister, [name] to assist with 24/7 supervision/care. Sister plans to take patient to her daughters home  (pt'[s niece) directly next door to complete showers and ADLS. 1 level home, level entry.  Patient has a SPC and shower seat. No additional questions or concerns.   Andria Rhein 03/09/2023, 2:11 PM

## 2023-03-09 NOTE — Evaluation (Signed)
Occupational Therapy Assessment and Plan  Patient Details  Name: Erica [name] MRN: [id number] Date of Birth: Oct 23, 1955  OT Diagnosis: hemiplegia affecting non-dominant side Rehab Potential: Rehab Potential (ACUTE ONLY): Excellent ELOS: 3-5 days   Today's Date: 03/09/2023 OT Individual Time: 4098-1191 OT Individual Time Calculation (min): 75 min     Hospital Problem: Principal Problem:   Stroke (cerebrum) [facility])   Past Medical History:  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   GERD (gastroesophageal reflux disease)    with chronic cough   Headache(784.0)    "mild"   Hypertension    Loop Biotronik loop implant 12/16/2020 12/16/2020   Mental disorder    depression   Neuropathy    feet    Pain    arthritis pain - takes tramadol as needed   Pulmonary fibrosis, unspecified (HCC)    Rectal cancer (HCC)    Rectal polyp    very little bleeding with bowel movements- no pain   Stroke (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   Suicide attempt [facility])    Past Surgical History:  Past Surgical History:  Procedure Laterality Date   ANAL RECTAL MANOMETRY N/A 07/13/2016   Procedure: ANO RECTAL MANOMETRY;  Surgeon: Romie Levee, MD;  [facility];  Service: Endoscopy;  Laterality: N/A;   CESAREAN SECTION     EUS N/A 11/21/2012   Procedure: LOWER ENDOSCOPIC ULTRASOUND (EUS);  Surgeon: Willis Modena, MD;  Location: [facility];  Service: Endoscopy;  Laterality: N/A;   FLEXIBLE SIGMOIDOSCOPY N/A 11/21/2012   Procedure: FLEXIBLE SIGMOIDOSCOPY;  Surgeon: Willis Modena, MD;  [facility];  Service: Endoscopy;  Laterality: N/A;   FLEXIBLE SIGMOIDOSCOPY N/A 01/28/2013   Procedure: FLEXIBLE SIGMOIDOSCOPY;  Surgeon: Romie Levee, MD;  [facility]: [facility];  Service: Endoscopy;  Laterality: N/A;   LAPAROSCOPIC LOW ANTERIOR  RESECTION N/A 01/29/2013   Procedure: LAPAROSCOPIC LOW ANTERIOR RESECTION, Rigid Proctoscopy;  Surgeon: Romie Levee, MD;  [facility];  Service: General;  Laterality: N/A;   LAPAROSCOPIC SIGMOID COLECTOMY N/A 11/14/2012   Procedure: DIAGNOSTIC LAPAROSCOPY AND SIGMOIDMOIDOSCOPY ;  Surgeon: Emelia Loron, MD;  Location: [facility];  Service: General;  Laterality: N/A;   RECTAL ULTRASOUND N/A 07/13/2016   Procedure: RECTAL ULTRASOUND;  Surgeon: Romie Levee, MD;  [facility];  Service: Endoscopy;  Laterality: N/A;   TONSILLECTOMY     TONSILLECTOMY AND ADENOIDECTOMY      Assessment & Plan Clinical Impression:. Erica [name] is a 67 year old female with history of HTN, T2DM, depression, CVA with residual left sided deficits, rectal cancer, GERD, COPD/emphysema, pulmonary fibrosis with baseline dyspnea and chronic cough who was admitted to [facility] on 03/04/23 with  mental status changes and difficulty arousing patient from sleep. She was found to have small acute to subacute infarcts in bilateral centrumovale.  Dr. Iver Nestle evaluated patient and felt that stroke likely due to small vessel disease. 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 Carotid ultrasound without significant ICA stenosis. MRA without occlusion or significant stenosis.  2 D echo showed EF- 65-70/5 without wall abnormality. She was started on DAPT X 21 days to be followed by ASA alone--does have a loop recorder to rule out A fib.  Vitamin B/thiamine WNL.HIV, RPR,  ANCA negative and mild elevation in ESR (normal CRP) not felt to be infectious etiology.    Patient with ataxic gait, fatigue, cognitive deficits and decrease control of LLE w/balance deficits. Therapy has been working with patient who requires min to mod assist with ADLs and mobility. CIR recommended due to functional decline. C/o neuropathy in feet and left  hand.   Patient transferred to CIR on 03/08/2023 .    Patient currently requires supervision with basic self-care skills secondary to unbalanced muscle activation and decreased coordination and decreased standing balance and hemiplegia.  Prior to hospitalization, patient could complete ADLs with modified independent .  Patient will benefit from skilled intervention to increase independence with basic self-care skills and increase level of independence with iADL prior to discharge home with care partner.  Anticipate patient will require intermittent supervision and follow up outpatient.  OT - End of Session Activity Tolerance: Tolerates 30+ min activity with multiple rests OT Assessment Rehab Potential (ACUTE ONLY): Excellent OT Patient demonstrates impairments in the following area(s): Balance;Motor OT Basic ADL's Functional Problem(s): Bathing;Dressing OT Advanced ADL's Functional Problem(s): Simple Meal Preparation;Laundry;Light Housekeeping OT Transfers Functional Problem(s): Toilet;Tub/Shower OT Additional Impairment(s): None OT Plan OT Intensity: Minimum of 1-2 x/day, 45 to 90 minutes OT Frequency: 5 out of 7 days OT Duration/Estimated Length of Stay: 3-5 days OT Treatment/Interventions: Balance/vestibular training;Discharge planning;Functional mobility training;DME/adaptive equipment instruction;Neuromuscular re-education;Patient/family education;Psychosocial support;Therapeutic Activities;Self Care/advanced ADL retraining;Therapeutic Exercise;UE/LE Strength taining/ROM;UE/LE Coordination activities OT Self Feeding Anticipated Outcome(s): no goal, pt is independent OT Basic Self-Care Anticipated Outcome(s): Mod I OT Toileting Anticipated Outcome(s): Mod I OT Bathroom Transfers Anticipated Outcome(s): Mod I OT Recommendation Recommendations for Other Services: Therapeutic Recreation consult Therapeutic Recreation Interventions: Kitchen group Patient destination: Home Follow Up  Recommendations: Outpatient OT Equipment Recommended: Tub/shower seat   OT Evaluation Precautions/Restrictions  Precautions Precautions: Fall Restrictions Weight Bearing Restrictions: No     Pain Pain Assessment Pain Score: 0-No pain Home Living/Prior Functioning Home Living Family/patient expects to be discharged to:: Private residence Living Arrangements: Other relatives Available Help at Discharge: Family, Available 24 hours/day Type of Home: House Home Access: Level entry Home Layout: One level Bathroom Shower/Tub: Tub only Firefighter: Handicapped height Additional Comments: uses shower at nieces house next door, planning to move into senior living apartment  Lives With: Family Vision Baseline Vision/History: 1 Wears glasses Ability to See in Adequate Light: 1 Impaired (uses glasses but they are missing, blurry vision without glasses) Patient Visual Report: No change from baseline Vision Assessment?: No apparent visual deficits Perception  Perception: Within Functional Limits Praxis Praxis: WFL Cognition Cognition Arousal/Alertness: Awake/alert Orientation Level: Person;Place;Situation Person: Oriented Place: Oriented Situation: Oriented Memory: Appears intact Awareness: Appears intact Problem Solving: Appears intact Brief Interview for Mental Status (BIMS) Repetition of Three Words (First Attempt): 3 Temporal Orientation: Year: Correct Temporal Orientation: Month: Accurate within 5 days Temporal Orientation: Day: Correct Recall: "Sock": Yes, no cue required Recall: "Blue": Yes, after cueing ("a color") Recall: "Bed": Yes, no cue required BIMS Summary Score: 14 Sensation Sensation Light Touch: Impaired by gross assessment Additional Comments: pt reports numbness in L hand and feet (pt has had impaired sensation since last CVA 2 yrs ago) Coordination Finger Nose Finger Test: slow but functional with B hands GMC  - impaired, FMC - WFL Motor   Motor Motor: Hemiplegia Motor - Skilled Clinical Observations: mild L hemiplegia  Trunk/Postural Assessment  Cervical Assessment Cervical Assessment: Within Functional Limits Thoracic Assessment Thoracic Assessment: Within Functional Limits Lumbar Assessment Lumbar Assessment: Within  Functional Limits Postural Control Postural Control: Within Functional Limits  Balance Static Sitting Balance Static Sitting - Level of Assistance: 7: Independent Dynamic Sitting Balance Dynamic Sitting - Level of Assistance: 6: Modified independent (Device/Increase time) Static Standing Balance Static Standing - Level of Assistance: 5: Stand by assistance Dynamic Standing Balance Dynamic Standing - Level of Assistance: 4: Min assist Extremity/Trunk Assessment RUE Assessment RUE Assessment: Within Functional Limits LUE Assessment Active Range of Motion (AROM) Comments: sh flexion and abduction to 150 (pt reports old RTC injury) General Strength Comments: 4-/5  Care Tool Care Tool Self Care Eating   Eating Assist Level: Independent    Oral Care    Oral Care Assist Level: Supervision/Verbal cueing (standing at sink)    Bathing   Body parts bathed by patient: Right arm;Left arm;Chest;Abdomen;Front perineal area;Buttocks;Right upper leg;Left upper leg;Right lower leg;Left lower leg;Face     Assist Level: Supervision/Verbal cueing    Upper Body Dressing(including orthotics)   What is the patient wearing?: Bra;Pull over shirt   Assist Level: Supervision/Verbal cueing    Lower Body Dressing (excluding footwear)   What is the patient wearing?: Underwear/pull up;Pants Assist for lower body dressing: Supervision/Verbal cueing    Putting on/Taking off footwear   What is the patient wearing?: Non-skid slipper socks Assist for footwear: Supervision/Verbal cueing       Care Tool Toileting Toileting activity   Assist for toileting: Supervision/Verbal cueing     Care Tool Bed Mobility Roll  left and right activity   Roll left and right assist level: Independent    Sit to lying activity   Sit to lying assist level: Supervision/Verbal cueing    Lying to sitting on side of bed activity   Lying to sitting on side of bed assist level: the ability to move from lying on the back to sitting on the side of the bed with no back support.: Supervision/Verbal cueing     Care Tool Transfers Sit to stand transfer   Sit to stand assist level: Supervision/Verbal cueing    Chair/bed transfer   Chair/bed transfer assist level: Contact Guard/Touching assist     Toilet transfer   Assist Level: Contact Guard/Touching assist     Care Tool Cognition  Expression of Ideas and Wants Expression of Ideas and Wants: 4. Without difficulty (complex and basic) - expresses complex messages without difficulty and with speech that is clear and easy to understand  Understanding Verbal and Non-Verbal Content Understanding Verbal and Non-Verbal Content: 4. Understands (complex and basic) - clear comprehension without cues or repetitions   Memory/Recall Ability Memory/Recall Ability : Current season;Staff names and faces;That he or she is in a hospital/hospital unit   Refer to Care Plan for Long Term Goals  SHORT TERM GOAL WEEK 1 OT Short Term Goal 1 (Week 1): STGs = LTGs  Recommendations for other services: Adult nurse group   Skilled Therapeutic Intervention ADL ADL Eating: Independent Grooming: Supervision/safety Where Assessed-Grooming: Standing at sink Upper Body Bathing: Setup Where Assessed-Upper Body Bathing: Shower Lower Body Bathing: Supervision/safety Where Assessed-Lower Body Bathing: Shower Upper Body Dressing: Setup Lower Body Dressing: Supervision/safety Toileting: Supervision/safety Where Assessed-Toileting: Teacher, adult education: Furniture conservator/restorer Method: Proofreader: Acupuncturist: Health visitor Method: Designer, industrial/product: Press photographer   CGA with RW   Pt seen for initial evaluation and ADL training with a focus on safe functional balance/ mobility.  Reviewed role of OT,  discussed POC and pt's goals, and ELOS. Pt is very familiar as she recalls her stay in inpt rehab 2 yrs ago and has done a great deal of out pt therapies.   Pt was able to accomplish self care at a high level with no safety cues.  She will need to focus on balance and coordination with IADLs as she desires to move into her own senior apt with occasional care givers.   Pt resting in bed With all needs met and bed alarm set.      Discharge Criteria: Patient will be discharged from OT if patient refuses treatment 3 consecutive times without medical reason, if treatment goals not met, if there is a change in medical status, if patient makes no progress towards goals or if patient is discharged from hospital.  The above assessment, treatment plan, treatment alternatives and goals were discussed and mutually agreed upon: by patient  [facility] 03/09/2023, 9:50 AM

## 2023-03-10 DIAGNOSIS — I10 Essential (primary) hypertension: Secondary | ICD-10-CM | POA: Diagnosis not present

## 2023-03-10 DIAGNOSIS — E1121 Type 2 diabetes mellitus with diabetic nephropathy: Secondary | ICD-10-CM | POA: Diagnosis not present

## 2023-03-10 DIAGNOSIS — I639 Cerebral infarction, unspecified: Secondary | ICD-10-CM | POA: Diagnosis not present

## 2023-03-10 DIAGNOSIS — N1832 Chronic kidney disease, stage 3b: Secondary | ICD-10-CM | POA: Diagnosis not present

## 2023-03-10 LAB — GLUCOSE, CAPILLARY
Glucose-Capillary: 180 mg/dL — ABNORMAL HIGH (ref 70–99)
Glucose-Capillary: 187 mg/dL — ABNORMAL HIGH (ref 70–99)
Glucose-Capillary: 167 mg/dL — ABNORMAL HIGH (ref 70–99)
Glucose-Capillary: 254 mg/dL — ABNORMAL HIGH (ref 70–99)

## 2023-03-10 MED ORDER — INSULIN GLARGINE-YFGN 100 UNIT/ML ~~LOC~~ SOLN
30.0000 [IU] | Freq: Every day | SUBCUTANEOUS | Status: DC
  Administered 2023-03-11 – 2023-03-15 (×5): 30 [IU] via SUBCUTANEOUS
  Filled 2023-03-10 (×5): qty 0.3

## 2023-03-10 NOTE — Progress Notes (Addendum)
Physical Therapy Session Note  Patient Details  Name: Erica [name] MRN: [id number] Date of Birth: Feb 08, 1956  Today's Date: 03/10/2023 PT Individual Time: 1305-1350 PT Individual Time Calculation (min): 45 min  Today's Date: 03/10/2023 PT Missed Time: 15 Minutes Missed Time Reason: Patient fatigue  Short Term Goals: Week 1:  PT Short Term Goal 1 (Week 1): = LTG due to ELOS  Skilled Therapeutic Interventions/Progress Updates: Patient sitting in recliner on entrance to room. Patient alert and agreeable to PT session.   Patient reported no pain at beginning of session, only fatigue. Today's session focused on outcome measures, car transfers and stairs due to inability to perform on evaluation from fatigue.   Therapeutic Activity: Bed Mobility: Pt performed sit to supine from EOB with supervision. Transfers: Pt performed sit<>stand transfers throughout session with RW and with supervision for safety. Provided VC for B UE placement, sequence, and anterior scoot. Pt required max cues throughout session to recall hand placement and scoot.  Patient demonstrates increased fall risk as noted by score of   33/56 on Berg Balance Scale.  (<36= high risk for falls, close to 100%; 37-45 significant >80%; 46-51 moderate >50%; 52-55 lower >25%)  Car Transfer: - Pt use of RW and supervision for safety. Pt attempted to transfer into car facing towards entrance with feet first. Pt required VC for safe sequence in and out. Pt then performed with supervision and ambulated to ramp for uneven surface assessment (pt with CGA for safety) and in RW with no LOB or decrease/increase in gate speed.   Stair Navigation: - Ascending 4 (6") steps with use of BHR, and with CGA required. Pt with alternating step pattern. - Descending 4 (6") steps with use of BHR, and with CGA required. Pt with alternating step pattern  Pt performed stair navigation with increased time required, and with LE's slightly to the L when  stepping down.   Pt transported back to room in [facility] with session cut short due to pt being fatigued and wanting to go to sleep (pt was already fatigued prior during car transfer, and was willing to do stairs before going back to room).   Patient supine in bed at end of session with brakes locked, bed alarm set, and all needs within reach.      Therapy Documentation Precautions:  Precautions Precautions: Fall Restrictions Weight Bearing Restrictions: No General: PT Amount of Missed Time (min): 15 Minutes PT Missed Treatment Reason: Patient fatigue Vital Signs: Therapy Vitals Temp: 97.7 F (36.5 C) Temp Source: Oral Pulse Rate: 75 Resp: 18 BP: (!) 142/80 Patient Position (if appropriate): Sitting Oxygen Therapy SpO2: 97 % O2 Device: Room Air Pain:   Mobility:   Locomotion : Stairs / Additional Locomotion Stairs: Yes Stairs Assistance: Contact Guard/Touching assist Stair Management Technique: Alternating pattern;Two rails Number of Stairs: 4 Height of Stairs: 6 Ramp: Contact Guard/touching assist  Trunk/Postural Assessment :    Balance: Balance Balance Assessed: Yes Standardized Balance Assessment Standardized Balance Assessment: Berg Balance Test Berg Balance Test Sit to Stand: Able to stand without using hands and stabilize independently Standing Unsupported: Able to stand 2 minutes with supervision Sitting with Back Unsupported but Feet Supported on Floor or Stool: Able to sit safely and securely 2 minutes Stand to Sit: Sits safely with minimal use of hands Transfers: Able to transfer safely, definite need of hands Standing Unsupported with Eyes Closed: Able to stand 10 seconds with supervision Standing Ubsupported with Feet Together: Able to place feet together independently and  stand for 1 minute with supervision From Standing, Reach Forward with Outstretched Arm: Can reach forward >5 cm safely (2") From Standing Position, Pick up Object from Floor: Able to  pick up shoe, needs supervision From Standing Position, Turn to Look Behind Over each Shoulder: Turn sideways only but maintains balance Turn 360 Degrees: Needs close supervision or verbal cueing Standing Unsupported, Alternately Place Feet on Step/Stool: Able to complete >2 steps/needs minimal assist Standing Unsupported, One Foot in Front: Loses balance while stepping or standing Standing on One Leg: Unable to try or needs assist to prevent fall Total Score: 33 Exercises:   Other Treatments:      Therapy/Group: Individual Therapy  Meital Riehl PTA 03/10/2023, 3:15 PM

## 2023-03-10 NOTE — Progress Notes (Signed)
Speech Language Pathology Daily Session Note  Patient Details  Name: Erica [name] MRN: [id number] Date of Birth: May 17, 1956  Today's Date: 03/10/2023 SLP Individual Time: 0900-1000 SLP Individual Time Calculation (min): 60 min  Short Term Goals: Week 1: SLP Short Term Goal 1 (Week 1): STG=LTG due to ELOS  Skilled Therapeutic Interventions: Skilled therapy session focused on cognitive goals. SLP faciliated session by educating patient on memory compensatory strategies. Patient then verbalized examples of each strategy with minA. After education regardings strategies, SLP initiated use of memory book and aided pateint in creation of daily home routine. The routine included typical activities such as "brushing teeth" and "taking medications." SLP continued to address cognition through complex word game. SLP verbalized instructions and patient completed task with mod-maxA. Patient unable to recall directions at end of activity stating "she feels like she does not have a mind anymore." SLP provided encouragment as appropriate.  Patient left in chair with alarm set and call bell in reach. Continue POC.   Pain None reported  Therapy/Group: Individual Therapy  Bashir Marchetti F Danitza Schoenfeldt 03/10/2023, 12:18 PM

## 2023-03-10 NOTE — Progress Notes (Signed)
Occupational Therapy Session Note  Patient Details  Name: Erica [name] MRN: [id number] Date of Birth: 1955-09-15  Today's Date: 03/10/2023 OT Individual Time: 1045-1200 OT Individual Time Calculation (min): 75 min    Short Term Goals: Week 1:  OT Short Term Goal 1 (Week 1): STGs = LTGs  Skilled Therapeutic Interventions/Progress Updates:    Pt received in wc. She declined a full bathe but did request to stand at sink to complete oral care and wash up UB.  Pt able to move in room with RW with light CGA to close S. Completed self care tasks in standing with supervision.  In room, she practiced reaching dynamically to pick up old papers off the bed and throw them away and hand her NT the bedding supplies.  Pt taken to ADL kitchen to work on dynamic balance with reaching for pots and pants with both hands and one hand only.  She discussed how all her cooking is on counter top with air fryer, crock pot, toaster oven. She has good safety awareness.  No LOB or support needed with retrieving and returning items to counter.   Pt taken to gym to work on LLE strength with step ups on 6 inch step.  Pt used B hands on rails. She could do task with min A but after 10 reps c/o of a "brain freeze".  She said she has been having these headaches off and on. Encouraged her to talk with her MD.    Sat down to work on LUE strength exercises with holding large gym ball and reaching over head and use of yellow theraband for L RTC exercises.   Pt returned to room and opted to sit in recliner for lunch. Belt alarm on and all needs met.   Therapy Documentation Precautions:  Precautions Precautions: Fall Restrictions Weight Bearing Restrictions: No      Pain:  C/o "brain freeze" headache but disappeared with rest  ADL: ADL Eating: Independent Grooming: Supervision/safety Where Assessed-Grooming: Standing at sink Upper Body Bathing: Setup Where Assessed-Upper Body Bathing: Shower Lower Body Bathing:  Supervision/safety Where Assessed-Lower Body Bathing: Shower Upper Body Dressing: Setup Lower Body Dressing: Supervision/safety Toileting: Supervision/safety Where Assessed-Toileting: Teacher, adult education: Furniture conservator/restorer Method: Proofreader: Acupuncturist: Administrator, arts Method: Designer, industrial/product: Grab bars, Transfer tub bench   Therapy/Group: Individual Therapy  Itati Brocksmith 03/10/2023, 12:47 PM

## 2023-03-10 NOTE — Progress Notes (Signed)
PROGRESS NOTE   Subjective/Complaints:  No issues overnite   ROS- neg CP, SOB, N/V/D  Objective:   No results found. Recent Labs    03/09/23 0653  WBC 4.5  HGB 10.6*  HCT 28.9*  PLT 143*   Recent Labs    03/09/23 0653  NA 140  K 4.1  CL 113*  CO2 22  GLUCOSE 156*  BUN 19  CREATININE 1.58*  CALCIUM 8.4*    Intake/Output Summary (Last 24 hours) at 03/10/2023 0847 Last data filed at 03/09/2023 1900 Gross per 24 hour  Intake 472 ml  Output --  Net 472 ml        Physical Exam: Vital Signs Blood pressure (!) 158/76, pulse 60, temperature 97.8 F (36.6 C), resp. rate 17, height 5\' 3"  (1.6 m), weight 73.7 kg, SpO2 100%.   General: No acute distress Mood and affect are appropriate Heart: Regular rate and rhythm no rubs murmurs or extra sounds Lungs: Clear to auscultation, breathing unlabored, no rales or wheezes Abdomen: Positive bowel sounds, soft nontender to palpation, nondistended Extremities: No clubbing, cyanosis, or edema Skin: No evidence of breakdown, no evidence of rash Neurologic: Cranial nerves II through XII intact, motor strength is 5/5 in right and 4+ Left deltoid, bicep, tricep, grip, hip flexor, knee extensors, ankle dorsiflexor and plantar flexor Sensory exam normal sensation to light touch  in bilateral upper and lower extremities Cerebellar exam normal finger to nose to finger as well as heel to shin in bilateral upper and lower extremities slower on the left side  Musculoskeletal: Full range of motion in all 4 extremities. No joint swelling   Assessment/Plan: 1. Functional deficits which require 3+ hours per day of interdisciplinary therapy in a comprehensive inpatient rehab setting. Physiatrist is providing close team supervision and 24 hour management of active medical problems listed below. Physiatrist and rehab team continue to assess barriers to discharge/monitor patient progress  toward functional and medical goals  Care Tool:  Bathing    Body parts bathed by patient: Right arm, Left arm, Chest, Abdomen, Front perineal area, Buttocks, Right upper leg, Left upper leg, Right lower leg, Left lower leg, Face         Bathing assist Assist Level: Supervision/Verbal cueing     Upper Body Dressing/Undressing Upper body dressing   What is the patient wearing?: Bra, Pull over shirt    Upper body assist Assist Level: Supervision/Verbal cueing    Lower Body Dressing/Undressing Lower body dressing      What is the patient wearing?: Underwear/pull up, Pants     Lower body assist Assist for lower body dressing: Supervision/Verbal cueing     Toileting Toileting    Toileting assist Assist for toileting: Supervision/Verbal cueing     Transfers Chair/bed transfer  Transfers assist     Chair/bed transfer assist level: Contact Guard/Touching assist     Locomotion Ambulation   Ambulation assist      Assist level: Contact Guard/Touching assist Assistive device: Walker-rolling Max distance: 46ft   Walk 10 feet activity   Assist     Assist level: Contact Guard/Touching assist Assistive device: Walker-rolling   Walk 50 feet activity   Assist Walk  50 feet with 2 turns activity did not occur: Safety/medical concerns (orthostatic)         Walk 150 feet activity   Assist Walk 150 feet activity did not occur: Safety/medical concerns         Walk 10 feet on uneven surface  activity   Assist Walk 10 feet on uneven surfaces activity did not occur: Safety/medical concerns         Wheelchair     Assist Is the patient using a wheelchair?: Yes Type of Wheelchair: Manual Wheelchair activity did not occur: Safety/medical concerns         Wheelchair 50 feet with 2 turns activity    Assist    Wheelchair 50 feet with 2 turns activity did not occur: Safety/medical concerns       Wheelchair 150 feet activity      Assist  Wheelchair 150 feet activity did not occur: Safety/medical concerns       Blood pressure (!) 158/76, pulse 60, temperature 97.8 F (36.6 C), resp. rate 17, height 5\' 3"  (1.6 m), weight 73.7 kg, SpO2 100%.  Medical Problem List and Plan: 1. Functional deficits secondary to acute infarcts of bilateral centrum semiovale. Mild L HP slightly worsened from prior stroke              -patient may shower             -ELOS/Goals: 5-7 days             Admit to CIR PT, OT evals today 2.  Antithrombotics: -DVT/anticoagulation:  Pharmaceutical: Lovenox             -antiplatelet therapy: DAPT X 3 weeks followed by ASA alont.  3. Peripheral neuropathy:  tylenol prn.4. Mood/Behavior/Sleep: LCSW to follow for evaluation and support             --melatonin prn for insomnia.              -antipsychotic agents: N/A 5. Neuropsych/cognition: This patient is not fully capable of making decisions on her own behalf. 6. Skin/Wound Care: Routine pressure relief measures.  7. Fluids/Electrolytes/Nutrition: Monitor I/O. Check CMET in am. 8. HTN: Monitor BP TID. Continue amlodipine 10 mg daily and Entresto 9. T2DM: Hgb A1C-6.9.  Was on lantus 24 u am/18 u pm             - recommended to avoid drinks with added sugar.  CBG (last 3)  Recent Labs    03/09/23 1710 03/09/23 2045 03/10/23 0626  GLUCAP 196* 240* 180*   CBGs still elevated increase long acting insulin to 30U  10 H/o COPD/IPF: Per records has chronic DOE/cough.             --on Pirfenidone TID. No MDI-->followed by Dr. Marchelle Gearing 11. GERD: On protonix  12. H/o Depression/anxiety/SA: Continue Effexor and Zoloft. Check magnesium level tomorrow.    13. Screening for vitamin D deficiency: check vitamin D level tomorrow   14.  CKD 3b- low GFR for >48yr, monitor for superimposed azotemia , no nephro toxic meds identified     Latest Ref Rng & Units 03/09/2023    6:53 AM 03/06/2023    4:39 AM 03/04/2023    9:30 AM  BMP  Glucose 70 - 99  mg/dL 161  096  93   BUN 8 - 23 mg/dL 19  22  33   Creatinine 0.44 - 1.00 mg/dL 0.45  4.09  8.11   Sodium 135 - 145 mmol/L 140  139  138   Potassium 3.5 - 5.1 mmol/L 4.1  3.7  3.8   Chloride 98 - 111 mmol/L 113  115  111   CO2 22 - 32 mmol/L 22  19  20    Calcium 8.9 - 10.3 mg/dL 8.4  8.1  8.6     LOS: 2 days A FACE TO FACE EVALUATION WAS PERFORMED  Erick Colace 03/10/2023, 8:47 AM

## 2023-03-11 DIAGNOSIS — I10 Essential (primary) hypertension: Secondary | ICD-10-CM | POA: Diagnosis not present

## 2023-03-11 DIAGNOSIS — I639 Cerebral infarction, unspecified: Secondary | ICD-10-CM | POA: Diagnosis not present

## 2023-03-11 DIAGNOSIS — E1121 Type 2 diabetes mellitus with diabetic nephropathy: Secondary | ICD-10-CM | POA: Diagnosis not present

## 2023-03-11 DIAGNOSIS — N1832 Chronic kidney disease, stage 3b: Secondary | ICD-10-CM | POA: Diagnosis not present

## 2023-03-11 LAB — GLUCOSE, CAPILLARY
Glucose-Capillary: 160 mg/dL — ABNORMAL HIGH (ref 70–99)
Glucose-Capillary: 177 mg/dL — ABNORMAL HIGH (ref 70–99)
Glucose-Capillary: 181 mg/dL — ABNORMAL HIGH (ref 70–99)
Glucose-Capillary: 156 mg/dL — ABNORMAL HIGH (ref 70–99)

## 2023-03-11 NOTE — Progress Notes (Signed)
Physical Therapy Session Note  Patient Details  Name: Erica [name] MRN: [id number] Date of Birth: 07/27/1955  Today's Date: 03/11/2023 PT Individual Time: 1914-7829 PT Individual Time Calculation (min): 12 min  and Today's Date: 03/11/2023 PT Missed Time: 33 Minutes Missed Time Reason: Patient ill (Comment);Patient unwilling to participate  Short Term Goals: Week 1:  PT Short Term Goal 1 (Week 1): = LTG due to ELOS  Skilled Therapeutic Interventions/Progress Updates:  Patient supine in bed on entrance to room. Patient alert and not initially agreeable to PT session. Has covers pulled halfway up face.   Patient with headache pain complaint at start of session. Requesting to remain in bed.   Questioned pt re: potential need to toilet or improve positioning in bed with pt refusing either. Provided with education re: participation in sessions to progress mobility as well as mobility's effect on improving headache pain as pt has been lying down for a while. Pt continuing to refuse.   Pt missed 33 min of skilled therapy due to headache pain. Will re-attempt as schedule and pt availability permits.  Patient supine in bed at end of session with brakes locked, bed alarm set, and all needs within reach.   Therapy Documentation Precautions:  Precautions Precautions: Fall Restrictions Weight Bearing Restrictions: No General: PT Amount of Missed Time (min): 33 Minutes PT Missed Treatment Reason: Patient ill (Comment);Patient unwilling to participate Vital Signs:   Pain:   Mobility:   Locomotion :    Trunk/Postural Assessment :    Balance:   Exercises:   Other Treatments:      Therapy/Group: Individual Therapy  Loel Dubonnet PT, DPT, CSRS 03/11/2023, 6:47 PM

## 2023-03-11 NOTE — Progress Notes (Signed)
Physical Therapy Session Note  Patient Details  Name: Erica [name] MRN: [id number] Date of Birth: September 15, 1955  Today's Date: 03/11/2023 PT Individual Time: 1004-1045 PT Individual Time Calculation (min): 41 min   Short Term Goals: Week 1:  PT Short Term Goal 1 (Week 1): = LTG due to ELOS  Skilled Therapeutic Interventions/Progress Updates: Patient supine in bed on entrance to room. Patient alert and agreeable to PT session.   Patient reported no pain at beginning of PT session.  Pt requested to see if it would be possible to receive an AFO with insurance. Pt reported that prior to admission, she struggled with L foot drop that would cause moments of close falls and "messing up" the front of shoes. PTA stated to discuss with attending PT, and that future sessions can also be focused on increasing DF activation (pt understood).   Pt transported room<>main gym for energy conservation.  Therapeutic Activity: Bed Mobility: Pt performed supine<>sit on EOB with supervision. VC required for use of railing to assist. Transfers: Pt performed sit<>stand transfers throughout session with supervision. Pt required VC to anteriorly scoot to edge of surface. Pt also requires max cues to recall hand sequence with pushing off instead of B UE on RW (pt quickly forgets cue). Pt performed transfer from EOB to WC (beginning of session), and WC to recliner (end of session) with no AD (R HHA and with light minA for upright standing balance).   Gait Training:  Pt ambulated 114' using RW with close supervision. Pt demonstrated the following gait deviations with therapist providing the described cuing and facilitation for improvement:  - Decreased stance time on L LE with VC to increase step length on R LE  - Decreased cadence - slight forward flexed posture - decreased step clearance with VC to increase heigh on L LE Pt presented with increased fatigue, requiring  a rest break at end of trial. Pt with notable  increase in foot drop on L LE with toes catching flooring with decrease in L hip flexion. Pt reported feeling light headeded/dizzy after sitting down (BP recorded with symptoms decreasing shortly after rest break).   Neuromuscular Re-ed: NMR facilitated during session with focus on neuromuscular connection to DF to decrease foot drop. - DF with yellow theraband in short sitting with VC to focus on eccentric portion.  NMR performed for improvements in motor control and coordination, balance, sequencing, judgement, and self confidence/ efficacy in performing all aspects of mobility at highest level of independence.   Patient sitting in recliner at end of session with brakes locked, belt alarm set, and all needs within reach.      Therapy Documentation Precautions:  Precautions Precautions: Fall Restrictions Weight Bearing Restrictions: No  Vitals: BP - 135/75 (93)  Therapy/Group: Individual Therapy  Lekesha Claw A Tenoch Mcclure 03/11/2023, 12:45 PM

## 2023-03-11 NOTE — Progress Notes (Signed)
Occupational Therapy Session Note  Patient Details  Name: Erica [name] MRN: [id number] Date of Birth: Nov 16, 1955  Today's Date: 03/11/2023 OT Individual Time: 1118-1200 OT Individual Time Calculation (min): 42 min    Short Term Goals: Week 1:  OT Short Term Goal 1 (Week 1): STGs = LTGs  Skilled Therapeutic Interventions/Progress Updates: Patient received sitting up in recliner.  Agreeable to OT treatment.  Patient requesting to perform ADL skills. Assisted patient with shower and self care tasks as listed below. Patient demonstrated good balance for grooming tasks standing at the sink and good safety awareness. Continue with skilled OT POC to help patient achieve LTG's of being independent with assistive devices.       Therapy Documentation Precautions:  Precautions Precautions: Fall Restrictions Weight Bearing Restrictions: No General:   Vital Signs: Therapy Vitals BP: 136/86 Pain: Pain Assessment Pain Scale: 0-10 Pain Score: 0-No pain ADL: ADL Eating: Independent Grooming: Supervision/safety Where Assessed-Grooming: Standing at sink Upper Body Bathing: Setup Where Assessed-Upper Body Bathing: Shower Lower Body Bathing: Supervision/safety Where Assessed-Lower Body Bathing: Shower Upper Body Dressing: Setup Lower Body Dressing: Setup Toileting: Modified independent Where Assessed-Toileting: Teacher, adult education: SPV Statistician Method: Proofreader: Engineer, manufacturing systems Transfer: SPV Film/video editor Method: Designer, industrial/product: Grab bars, Transfer tub bench  Therapy/Group: Individual Therapy  Warnell Forester 03/11/2023, 12:14 PM

## 2023-03-11 NOTE — IPOC Note (Signed)
Overall Plan of Care [facility]) Patient Details Name: Erica [name] MRN: [id number] DOB: 1955/12/08  Admitting Diagnosis: Stroke (cerebrum) [facility])  Hospital Problems: Principal Problem:   Stroke (cerebrum) (HCC)     Functional Problem List: Nursing Bowel, Endurance, Medication Management, Safety  PT Balance, Endurance, Motor, Safety, Sensory  OT Balance, Motor  SLP Cognition  TR         Basic ADL's: OT Bathing, Dressing     Advanced  ADL's: OT Simple Meal Preparation, Laundry, Light Housekeeping     Transfers: PT Bed Mobility, Bed to Chair, Customer service manager, Tub/Shower     Locomotion: PT Ambulation, Stairs     Additional Impairments: OT None  SLP Social Cognition   Problem Solving, Memory  TR      Anticipated Outcomes Item Anticipated Outcome  Self Feeding no goal, pt is independent  Swallowing      Basic self-care  Mod I  Toileting  Mod I   Bathroom Transfers Mod I  Bowel/Bladder  manage bowel w mod I assist  Transfers  mod I  Locomotion  supervision  Communication  supervisionA  Cognition  supervisionA  Pain  < 4 with prns  Safety/Judgment  manage w cues   Therapy Plan: PT Intensity: Minimum of 1-2 x/day ,45 to 90 minutes PT Frequency: 5 out of 7 days PT Duration Estimated Length of Stay: 4-7 days OT Intensity: Minimum of 1-2 x/day, 45 to 90 minutes OT Frequency: 5 out of 7 days OT Duration/Estimated Length of Stay: 3-5 days SLP Intensity: Minumum of 1-2 x/day, 30 to 90 minutes SLP Frequency: 1 to 3 out of 7 days SLP Duration/Estimated Length of Stay: 5-7 days   Team Interventions: Nursing Interventions Patient/Family Education, Pain Management, Medication Management, Discharge Planning, Bowel Management, Disease Management/Prevention  PT interventions Ambulation/gait training, Discharge planning, Functional mobility training, Psychosocial support, Therapeutic Activities, Visual/perceptual remediation/compensation, Wheelchair  propulsion/positioning, Therapeutic Exercise, Skin care/wound management, Neuromuscular re-education, Disease management/prevention, Metallurgist training, Cognitive remediation/compensation, Splinting/orthotics, Pain management, DME/adaptive equipment instruction, UE/LE Strength taining/ROM, UE/LE Coordination activities, Stair training, Functional electrical stimulation, Firefighter, Equities trader education  OT Interventions Warden/ranger, Discharge planning, Functional mobility training, DME/adaptive equipment instruction, Neuromuscular re-education, Patient/family education, Psychosocial support, Therapeutic Activities, Self Care/advanced ADL retraining, Therapeutic Exercise, UE/LE Strength taining/ROM, UE/LE Coordination activities  SLP Interventions Cognitive remediation/compensation, Internal/external aids, Cueing hierarchy, Environmental controls, Therapeutic Activities, Functional tasks, Patient/family education, Therapeutic Exercise  TR Interventions    SW/CM Interventions Discharge Planning, Psychosocial Support, Patient/Family Education, Disease Management/Prevention   Barriers to Discharge MD  Medical stability  Nursing Decreased caregiver support, Home environment access/layout 1 level ,Ind with self care tasks and sister assist with IADLs as needed; HH aide 3 days/week  PT Decreased caregiver support, Community education officer for SNF coverage, Lack of/limited family support    OT      SLP      SW Insurance for SNF coverage, Inaccessible home environment, Decreased caregiver support, Lack of/limited family support     Team Discharge Planning: Destination: PT-Home ,OT- Home , SLP-Home Projected Follow-up: PT-24 hour supervision/assistance, Outpatient PT, OT-  Outpatient OT, SLP-None Projected Equipment Needs: PT-To be determined, OT- Tub/shower seat, SLP-None recommended by SLP Equipment Details: PT- , OT-  Patient/family involved in discharge planning: PT-  Patient,  OT-Patient, SLP-Patient  MD ELOS: 7-10d Medical Rehab Prognosis:  Good Assessment: The patient has been admitted for CIR therapies with the diagnosis of stroke . The team will be addressing functional mobility, strength, stamina, balance, safety, adaptive techniques and equipment,  self-care, bowel and bladder mgt, patient and caregiver education, diabetic management . Goals have been set at Sup. Anticipated discharge destination is home .        See Team Conference Notes for weekly updates to the plan of care

## 2023-03-11 NOTE — Progress Notes (Signed)
Physical Therapy Session Note  Patient Details  Name: BIRD TAILOR MRN: [id number] Date of Birth: 01-25-1956  Today's Date: 03/11/2023 PT Individual Time: 1004-1045 PT Individual Time Calculation (min): 41 min   Short Term Goals: Week 1:  PT Short Term Goal 1 (Week 1): = LTG due to ELOS  PTA attempted to make up pt's missed minutes this day but pt denied PT services due to fatigue. Will attempt at a later time as schedule allows.     Therapy Documentation Precautions:  Precautions Precautions: Fall Restrictions Weight Bearing Restrictions: No  Therapy/Group: Individual Therapy  Darrek Leasure PTA 03/11/2023, 3:00 PM

## 2023-03-11 NOTE — Progress Notes (Signed)
PROGRESS NOTE   Subjective/Complaints:  No issues overnite , ambulating distance improving per PT Reviewed meds  Pt denies any pains today   ROS- neg CP, SOB, N/V/D  Objective:   No results found. Recent Labs    03/09/23 0653  WBC 4.5  HGB 10.6*  HCT 28.9*  PLT 143*   Recent Labs    03/09/23 0653  NA 140  K 4.1  CL 113*  CO2 22  GLUCOSE 156*  BUN 19  CREATININE 1.58*  CALCIUM 8.4*    Intake/Output Summary (Last 24 hours) at 03/11/2023 1023 Last data filed at 03/10/2023 1838 Gross per 24 hour  Intake 716 ml  Output --  Net 716 ml        Physical Exam: Vital Signs Blood pressure 136/86, pulse 60, temperature 98.3 F (36.8 C), temperature source Oral, resp. rate 18, height 5\' 3"  (1.6 m), weight 73.7 kg, SpO2 97%.   General: No acute distress Mood and affect are appropriate Heart: Regular rate and rhythm no rubs murmurs or extra sounds Lungs: Clear to auscultation, breathing unlabored, no rales or wheezes Abdomen: Positive bowel sounds, soft nontender to palpation, nondistended Extremities: No clubbing, cyanosis, or edema Skin: No evidence of breakdown, no evidence of rash Neurologic: Cranial nerves II through XII intact, motor strength is 5/5 in right and 4+ Left deltoid, bicep, tricep, grip, hip flexor, knee extensors, ankle dorsiflexor and plantar flexor Sensory exam normal sensation to light touch  in bilateral upper and lower extremities Cerebellar exam normal finger to nose to finger as well as heel to shin in bilateral upper and lower extremities slower on the left side  Musculoskeletal: Full range of motion in all 4 extremities. No joint swelling   Assessment/Plan: 1. Functional deficits which require 3+ hours per day of interdisciplinary therapy in a comprehensive inpatient rehab setting. Physiatrist is providing close team supervision and 24 hour management of active medical problems listed  below. Physiatrist and rehab team continue to assess barriers to discharge/monitor patient progress toward functional and medical goals  Care Tool:  Bathing    Body parts bathed by patient: Right arm, Left arm, Chest, Abdomen, Front perineal area, Buttocks, Right upper leg, Left upper leg, Right lower leg, Left lower leg, Face         Bathing assist Assist Level: Supervision/Verbal cueing     Upper Body Dressing/Undressing Upper body dressing   What is the patient wearing?: Bra, Pull over shirt    Upper body assist Assist Level: Supervision/Verbal cueing    Lower Body Dressing/Undressing Lower body dressing      What is the patient wearing?: Underwear/pull up, Pants     Lower body assist Assist for lower body dressing: Supervision/Verbal cueing     Toileting Toileting    Toileting assist Assist for toileting: Supervision/Verbal cueing     Transfers Chair/bed transfer  Transfers assist     Chair/bed transfer assist level: Contact Guard/Touching assist     Locomotion Ambulation   Ambulation assist      Assist level: Contact Guard/Touching assist Assistive device: Walker-rolling Max distance: 6ft   Walk 10 feet activity   Assist     Assist level:  Contact Guard/Touching assist Assistive device: Walker-rolling   Walk 50 feet activity   Assist Walk 50 feet with 2 turns activity did not occur: Safety/medical concerns (orthostatic)         Walk 150 feet activity   Assist Walk 150 feet activity did not occur: Safety/medical concerns         Walk 10 feet on uneven surface  activity   Assist Walk 10 feet on uneven surfaces activity did not occur: Safety/medical concerns         Wheelchair     Assist Is the patient using a wheelchair?: Yes Type of Wheelchair: Manual Wheelchair activity did not occur: Safety/medical concerns         Wheelchair 50 feet with 2 turns activity    Assist    Wheelchair 50 feet with 2  turns activity did not occur: Safety/medical concerns       Wheelchair 150 feet activity     Assist  Wheelchair 150 feet activity did not occur: Safety/medical concerns       Blood pressure 136/86, pulse 60, temperature 98.3 F (36.8 C), temperature source Oral, resp. rate 18, height 5\' 3"  (1.6 m), weight 73.7 kg, SpO2 97%.  Medical Problem List and Plan: 1. Functional deficits secondary to acute infarcts of bilateral centrum semiovale. Mild L HP slightly worsened from prior stroke              -patient may shower             -ELOS/Goals: 5-7 days             Admit to CIR PT, OT evals today 2.  Antithrombotics: -DVT/anticoagulation:  Pharmaceutical: Lovenox             -antiplatelet therapy: DAPT X 3 weeks followed by ASA alont.  3. Peripheral neuropathy:  tylenol prn.4. Mood/Behavior/Sleep: LCSW to follow for evaluation and support             --melatonin prn for insomnia.              -antipsychotic agents: N/A 5. Neuropsych/cognition: This patient is not fully capable of making decisions on her own behalf. 6. Skin/Wound Care: Routine pressure relief measures.  7. Fluids/Electrolytes/Nutrition: Monitor I/O. Check CMET in am. 8. HTN: Monitor BP TID. Continue amlodipine 10 mg daily and Entresto 9. T2DM: Hgb A1C-6.9.  Was on lantus 24 u am/18 u pm             - recommended to avoid drinks with added sugar.  CBG (last 3)  Recent Labs    03/10/23 1648 03/10/23 2105 03/11/23 0550  GLUCAP 167* 254* 160*  increased long acting insulin to 30U - starting 9/28, still less than total glargine dose at home  10 H/o COPD/IPF: Per records has chronic DOE/cough.             --on Pirfenidone TID. No MDI-->followed by Dr. Marchelle Gearing 11. GERD: On protonix  12. H/o Depression/anxiety/SA: Continue Effexor and Zoloft. Check magnesium level tomorrow.    13. Screening for vitamin D deficiency: check vitamin D level tomorrow   14.  CKD 3b- low GFR for >56yr, monitor for superimposed azotemia  , no nephro toxic meds identified     Latest Ref Rng & Units 03/09/2023    6:53 AM 03/06/2023    4:39 AM 03/04/2023    9:30 AM  BMP  Glucose 70 - 99 mg/dL 161  096  93   BUN 8 - 23 mg/dL  19  22  33   Creatinine 0.44 - 1.00 mg/dL 4.09  8.11  9.14   Sodium 135 - 145 mmol/L 140  139  138   Potassium 3.5 - 5.1 mmol/L 4.1  3.7  3.8   Chloride 98 - 111 mmol/L 113  115  111   CO2 22 - 32 mmol/L 22  19  20    Calcium 8.9 - 10.3 mg/dL 8.4  8.1  8.6     LOS: 3 days A FACE TO FACE EVALUATION WAS PERFORMED  Erick Colace 03/11/2023, 10:23 AM

## 2023-03-11 NOTE — Progress Notes (Signed)
SLP Cancellation Note  Patient Details Name: Erica [name] MRN: [id number] DOB: April 12, 1956   Cancelled treatment:   Patient refused to participate in ST this date due to headache. RN made aware. SLP will attempt to make up missed minutes as therapy and patient schedule allows.                                                                                          Othella Slappey M.A., CF-SLP 03/11/2023, 2:00 PM

## 2023-03-11 NOTE — Plan of Care (Signed)
  Problem: RH BOWEL ELIMINATION Goal: RH STG MANAGE BOWEL WITH ASSISTANCE Description: STG Manage Bowel with mod I Assistance. Flowsheets (Taken 03/11/2023 0259) STG: Pt will manage bowels with assistance: 7-Independent   Problem: RH SAFETY Goal: RH STG ADHERE TO SAFETY PRECAUTIONS W/ASSISTANCE/DEVICE Description: STG Adhere to Safety Precautions With cues Assistance/Device. Flowsheets (Taken 03/11/2023 0259) STG:Pt will adhere to safety precautions with assistance/device: 6-Modified independent   Problem: RH PAIN MANAGEMENT Goal: RH STG PAIN MANAGED AT OR BELOW PT'S PAIN GOAL Description: < 4 with prns Note: Pain less than 2 on pain scale

## 2023-03-12 DIAGNOSIS — E1121 Type 2 diabetes mellitus with diabetic nephropathy: Secondary | ICD-10-CM | POA: Diagnosis not present

## 2023-03-12 DIAGNOSIS — I10 Essential (primary) hypertension: Secondary | ICD-10-CM | POA: Diagnosis not present

## 2023-03-12 DIAGNOSIS — I639 Cerebral infarction, unspecified: Secondary | ICD-10-CM | POA: Diagnosis not present

## 2023-03-12 DIAGNOSIS — N1832 Chronic kidney disease, stage 3b: Secondary | ICD-10-CM | POA: Diagnosis not present

## 2023-03-12 LAB — GLUCOSE, CAPILLARY
Glucose-Capillary: 136 mg/dL — ABNORMAL HIGH (ref 70–99)
Glucose-Capillary: 160 mg/dL — ABNORMAL HIGH (ref 70–99)
Glucose-Capillary: 194 mg/dL — ABNORMAL HIGH (ref 70–99)
Glucose-Capillary: 141 mg/dL — ABNORMAL HIGH (ref 70–99)

## 2023-03-12 NOTE — Progress Notes (Signed)
PROGRESS NOTE   Subjective/Complaints:  No issues overnight per patient, she is braiding her hair with both upper extremities.  She is wanting to go home as soon as she can.  She states she can stay with her sister ROS- neg CP, SOB, N/V/D  Objective:   No results found. No results for input(s): "WBC", "HGB", "HCT", "PLT" in the last 72 hours.  No results for input(s): "NA", "K", "CL", "CO2", "GLUCOSE", "BUN", "CREATININE", "CALCIUM" in the last 72 hours.   Intake/Output Summary (Last 24 hours) at 03/12/2023 1057 Last data filed at 03/12/2023 0800 Gross per 24 hour  Intake 956 ml  Output --  Net 956 ml        Physical Exam: Vital Signs Blood pressure (!) 140/71, pulse 66, temperature 98.4 F (36.9 C), temperature source Oral, resp. rate 18, height 5\' 3"  (1.6 m), weight 73.7 kg, SpO2 100%.   General: No acute distress Mood and affect are appropriate Heart: Regular rate and rhythm no rubs murmurs or extra sounds Lungs: Clear to auscultation, breathing unlabored, no rales or wheezes Abdomen: Positive bowel sounds, soft nontender to palpation, nondistended Extremities: No clubbing, cyanosis, or edema Skin: No evidence of breakdown, no evidence of rash Neurologic: Cranial nerves II through XII intact, motor strength is 5/5 in right and 4+ Left deltoid, bicep, tricep, grip, hip flexor, knee extensors, ankle dorsiflexor and plantar flexor Sensory exam normal sensation to light touch  in bilateral upper and lower extremities Cerebellar exam normal finger to nose to finger as well as heel to shin in bilateral upper and lower extremities slower on the left side  Musculoskeletal: Full range of motion in all 4 extremities. No joint swelling   Assessment/Plan: 1. Functional deficits which require 3+ hours per day of interdisciplinary therapy in a comprehensive inpatient rehab setting. Physiatrist is providing close team  supervision and 24 hour management of active medical problems listed below. Physiatrist and rehab team continue to assess barriers to discharge/monitor patient progress toward functional and medical goals  Care Tool:  Bathing    Body parts bathed by patient: Right arm, Left arm, Chest, Abdomen, Front perineal area, Buttocks, Left upper leg, Right upper leg, Right lower leg, Left lower leg, Face         Bathing assist Assist Level: Set up assist     Upper Body Dressing/Undressing Upper body dressing   What is the patient wearing?: Bra, Pull over shirt    Upper body assist Assist Level: Set up assist    Lower Body Dressing/Undressing Lower body dressing      What is the patient wearing?: Underwear/pull up, Pants     Lower body assist Assist for lower body dressing: Set up assist     Toileting Toileting    Toileting assist Assist for toileting: Independent with assistive device     Transfers Chair/bed transfer  Transfers assist     Chair/bed transfer assist level: Contact Guard/Touching assist     Locomotion Ambulation   Ambulation assist      Assist level: Contact Guard/Touching assist Assistive device: Walker-rolling Max distance: 59ft   Walk 10 feet activity   Assist     Assist level:  Contact Guard/Touching assist Assistive device: Walker-rolling   Walk 50 feet activity   Assist Walk 50 feet with 2 turns activity did not occur: Safety/medical concerns (orthostatic)         Walk 150 feet activity   Assist Walk 150 feet activity did not occur: Safety/medical concerns         Walk 10 feet on uneven surface  activity   Assist Walk 10 feet on uneven surfaces activity did not occur: Safety/medical concerns         Wheelchair     Assist Is the patient using a wheelchair?: Yes Type of Wheelchair: Manual Wheelchair activity did not occur: Safety/medical concerns         Wheelchair 50 feet with 2 turns  activity    Assist    Wheelchair 50 feet with 2 turns activity did not occur: Safety/medical concerns       Wheelchair 150 feet activity     Assist  Wheelchair 150 feet activity did not occur: Safety/medical concerns       Blood pressure (!) 140/71, pulse 66, temperature 98.4 F (36.9 C), temperature source Oral, resp. rate 18, height 5\' 3"  (1.6 m), weight 73.7 kg, SpO2 100%.  Medical Problem List and Plan: 1. Functional deficits secondary to acute infarcts of bilateral centrum semiovale. Mild L HP slightly worsened from prior stroke              -patient may shower             -ELOS/Goals: 5-7 days             Admit to CIR PT, OT evals today 2.  Antithrombotics: -DVT/anticoagulation:  Pharmaceutical: Lovenox             -antiplatelet therapy: DAPT X 3 weeks followed by ASA alont.  3. Peripheral neuropathy:  tylenol prn.4. Mood/Behavior/Sleep: LCSW to follow for evaluation and support             --melatonin prn for insomnia.              -antipsychotic agents: N/A 5. Neuropsych/cognition: This patient is not fully capable of making decisions on her own behalf. 6. Skin/Wound Care: Routine pressure relief measures.  7. Fluids/Electrolytes/Nutrition: Monitor I/O. Check CMET in am. 8. HTN: Monitor BP TID. Continue amlodipine 10 mg daily and Entresto Vitals:   03/12/23 0420 03/12/23 0828  BP: (!) 145/74 (!) 140/71  Pulse: 66   Resp: 18   Temp: 98.4 F (36.9 C)   SpO2: 100%   Fair control 03/12/2023 9. T2DM: Hgb A1C-6.9.  Was on lantus 24 u am/18 u pm             - recommended to avoid drinks with added sugar.  CBG (last 3)  Recent Labs    03/11/23 1656 03/11/23 2058 03/12/23 0607  GLUCAP 181* 156* 136*  increased long acting insulin to 30U - starting 9/28, still less than total glargine dose at home, a.m. CBG improved 10 H/o COPD/IPF: Per records has chronic DOE/cough.             --on Pirfenidone TID. No MDI-->followed by Dr. Marchelle Gearing 11. GERD: On protonix   12. H/o Depression/anxiety/SA: Continue Effexor and Zoloft. Check magnesium level tomorrow.    13. Screening for vitamin D deficiency: check vitamin D level tomorrow   14.  CKD 3b- low GFR for >55yr, monitor for superimposed azotemia , no nephro toxic meds identified     Latest Ref Rng &  Units 03/09/2023    6:53 AM 03/06/2023    4:39 AM 03/04/2023    9:30 AM  BMP  Glucose 70 - 99 mg/dL 160  109  93   BUN 8 - 23 mg/dL 19  22  33   Creatinine 0.44 - 1.00 mg/dL 3.23  5.57  3.22   Sodium 135 - 145 mmol/L 140  139  138   Potassium 3.5 - 5.1 mmol/L 4.1  3.7  3.8   Chloride 98 - 111 mmol/L 113  115  111   CO2 22 - 32 mmol/L 22  19  20    Calcium 8.9 - 10.3 mg/dL 8.4  8.1  8.6    Repeat in a.m. 03/13/2023 LOS: 4 days A FACE TO FACE EVALUATION WAS PERFORMED  Erick Colace 03/12/2023, 10:57 AM

## 2023-03-13 DIAGNOSIS — G441 Vascular headache, not elsewhere classified: Secondary | ICD-10-CM

## 2023-03-13 DIAGNOSIS — I639 Cerebral infarction, unspecified: Secondary | ICD-10-CM | POA: Diagnosis not present

## 2023-03-13 DIAGNOSIS — I10 Essential (primary) hypertension: Secondary | ICD-10-CM | POA: Diagnosis not present

## 2023-03-13 DIAGNOSIS — N1832 Chronic kidney disease, stage 3b: Secondary | ICD-10-CM | POA: Diagnosis not present

## 2023-03-13 LAB — CBC
HCT: 30.7 % — ABNORMAL LOW (ref 36.0–46.0)
Hemoglobin: 10.8 g/dL — ABNORMAL LOW (ref 12.0–15.0)
MCH: 32 pg (ref 26.0–34.0)
MCHC: 35.2 g/dL (ref 30.0–36.0)
MCV: 90.8 fL (ref 80.0–100.0)
Platelets: 133 10*3/uL — ABNORMAL LOW (ref 150–400)
RBC: 3.38 MIL/uL — ABNORMAL LOW (ref 3.87–5.11)
RDW: 16.1 % — ABNORMAL HIGH (ref 11.5–15.5)
WBC: 3.6 10*3/uL — ABNORMAL LOW (ref 4.0–10.5)
nRBC: 0 % (ref 0.0–0.2)

## 2023-03-13 LAB — BASIC METABOLIC PANEL
Anion gap: 6 (ref 5–15)
BUN: 28 mg/dL — ABNORMAL HIGH (ref 8–23)
CO2: 20 mmol/L — ABNORMAL LOW (ref 22–32)
Calcium: 8.3 mg/dL — ABNORMAL LOW (ref 8.9–10.3)
Chloride: 114 mmol/L — ABNORMAL HIGH (ref 98–111)
Creatinine, Ser: 1.6 mg/dL — ABNORMAL HIGH (ref 0.44–1.00)
GFR, Estimated: 35 mL/min — ABNORMAL LOW (ref >60)
Glucose, Bld: 106 mg/dL — ABNORMAL HIGH (ref 70–99)
Potassium: 4.3 mmol/L (ref 3.5–5.1)
Sodium: 140 mmol/L (ref 135–145)

## 2023-03-13 LAB — GLUCOSE, CAPILLARY
Glucose-Capillary: 118 mg/dL — ABNORMAL HIGH (ref 70–99)
Glucose-Capillary: 156 mg/dL — ABNORMAL HIGH (ref 70–99)
Glucose-Capillary: 203 mg/dL — ABNORMAL HIGH (ref 70–99)
Glucose-Capillary: 169 mg/dL — ABNORMAL HIGH (ref 70–99)

## 2023-03-13 MED ORDER — ACETAMINOPHEN 325 MG PO TABS
650.0000 mg | ORAL_TABLET | Freq: Two times a day (BID) | ORAL | Status: DC
  Administered 2023-03-13 – 2023-03-15 (×4): 650 mg via ORAL
  Filled 2023-03-13 (×5): qty 2

## 2023-03-13 MED ORDER — NAPHAZOLINE-GLYCERIN 0.012-0.25 % OP SOLN
1.0000 [drp] | Freq: Three times a day (TID) | OPHTHALMIC | Status: DC
  Administered 2023-03-13 (×2): 2 [drp] via OPHTHALMIC
  Administered 2023-03-14 – 2023-03-15 (×3): 1 [drp] via OPHTHALMIC
  Filled 2023-03-13: qty 15

## 2023-03-13 NOTE — Progress Notes (Signed)
Physical Therapy Session Note  Patient Details  Name: Erica [name] MRN: [id number] Date of Birth: 12/04/1955  Today's Date: 03/13/2023 PT Individual Time: 0805-0901 PT Individual Time Calculation (min): 56 min   Short Term Goals: Week 1:  PT Short Term Goal 1 (Week 1): = LTG due to ELOS  Skilled Therapeutic Interventions/Progress Updates:  Patient supine in bed on entrance to room. RN and nursing student present to provide morning medications. Patient alert and agreeable to PT session.   Patient with no pain complaint at start of session.  Therapeutic Activity: Bed Mobility: Pt performed supine <> sit with Mod I. No cueing required for technique. Transfers: Pt performed sit<>stand and stand pivot transfers throughout session with CGA and vc for improved technique initially but then improves to supervision. Provided verbal cues for improved positioning in prep for sit<>stands.  Gait Training:  Pt ambulated *** ft using *** with ***. Demonstrated ***. Provided vc/ tc for ***.  - following morning meds, pt ambulates to bathroom, then to sink to wash hands/ face and brush teeth - ambulated to/ from main gym with RW - ambulated loop around main gym using [employer]- slower pace with more step to pattern.   Patient supine in bed at end of session with brakes locked, bed alarm set, and all needs within reach.   Therapy Documentation Precautions:  Precautions Precautions: Fall Restrictions Weight Bearing Restrictions: No General:   Vital Signs:   Pain:  Pt c/o cold headache pain similar to "brain freeze" without numerical rating with MD during rounding. Pain tolerable as pt able to continue with therapy session.      Therapy/Group: Individual Therapy  Loel Dubonnet PT, DPT, CSRS 03/13/2023, 12:41 PM

## 2023-03-13 NOTE — Progress Notes (Signed)
Occupational Therapy Session Note  Patient Details  Name: Erica [name] MRN: [id number] Date of Birth: 21-Jul-1955  Today's Date: 03/13/2023 OT Individual Time: 0930-1030 and 1305-1340 OT Individual Time Calculation (min): 60 min and 35 min    Short Term Goals: Week 1:  OT Short Term Goal 1 (Week 1): STGs = LTGs  Skilled Therapeutic Interventions/Progress Updates:    Visit 1: no c/o pain Pt received in room and ready for engaging in ADLS.  Pt completed toileting, grooming, shower and dressing all at a Mod I level.    Pt used quad cane to ambulate to laundry room and reach deep into dryer and washer to retrieve item.  Ambulated to gym and sat on chair with no arms and able to stand back up to return to room.   Pt resting in chair with all needs met and NT in room with her.  Visit 2:  Pain: no c/o pain, but pt did state she was very tired  Pt received in bed and stated she did not feel great but was willing to participate. Pt sat to EOB and donned her shoes and foot up brace with extended time.   She stated she plans to use the cane and not a RW at home.  Pt practiced ambulating with cane as she did in earlier session. Pointed out to pt that when she uses the cane she has a more uneven gait pattern and has difficulty with a smooth alternating stepping pattern.  Encouraged her to try with RW.  Pt used walker and her gait was more controlled balanced and stepping pattern more efficient.  Discussed with her having PT order her one to use for longer distances but she could use the cane in the home.  Pt asking about an AFO.  I stated that I felt like her current brace was a great choice as pt does have active dorsiflexion and she is able to don that brace. An AFO would make donning the shoe more difficult.  Did get one to show her but it was too big for her shoe. Was able to show pt the challenge with putting it on especially in a soft tennis shoe that she likes to wear.  Told pt the PT can follow up  with her the next session.  Discussed possible dc date of wed 03/15/23. Pt in agreement.  Pt resting in bed with alarm set and all needs met.     Therapy Documentation Precautions:  Precautions Precautions: Fall Restrictions Weight Bearing Restrictions: No  Vital Signs: Therapy Vitals Temp: 98.3 F (36.8 C) Temp Source: Oral Pulse Rate: 69 Resp: 18 BP: 139/70 Patient Position (if appropriate): Lying Oxygen Therapy SpO2: 96 % O2 Device: Room Air Pain: Pain Assessment Pain Scale: 0-10 Pain Score: 0-No pain ADL: ADL Eating: Independent Grooming: Supervision/safety Where Assessed-Grooming: Standing at sink Upper Body Bathing: Setup Where Assessed-Upper Body Bathing: Shower Lower Body Bathing: Supervision/safety Where Assessed-Lower Body Bathing: Shower Upper Body Dressing: Setup Lower Body Dressing: Supervision/safety Toileting: Supervision/safety Where Assessed-Toileting: Teacher, adult education: Furniture conservator/restorer Method: Proofreader: Acupuncturist: Administrator, arts Method: Designer, industrial/product: Grab bars, Transfer tub bench  Therapy/Group: Individual Therapy  Shanitra Phillippi 03/13/2023, 9:00 AM

## 2023-03-13 NOTE — Progress Notes (Signed)
Speech Language Pathology Daily Session Note  Patient Details  Name: Erica [name] MRN: [id number] Date of Birth: 1956-05-07  Today's Date: 03/13/2023 SLP Individual Time: 1400-1445 SLP Individual Time Calculation (min): 45 min  Short Term Goals: Week 1: SLP Short Term Goal 1 (Week 1): STG=LTG due to ELOS  Skilled Therapeutic Interventions:  Pt was seen in PM to address cognitive re- training. Pt was alert and seen in bed upon SLP arrival. She was agreeable for session. Pt verbalized recent medical hx and PLOF including hx of strokes. She feels that this stroke impacted her memory more than the others. SLP reviewed WRAP compensatory strategies including examples of utilization. Pt able to add insight from previous SLP sessions and recommendations made by her sister for use of notebook to facilitate recall. SLP challenged pt in recall of novel information. Given moderate level information presented verbally, SLP guided pt in utilization of repetition and association strategies. Given a distracted delay ~ 5 minutes pt recalled information with 60% acc indep improving to 80% with mod cues. In additional minutes of session, SLP challenged pt in medication management task. Given a schematic of a BID pill box, pt identified medication errors with mod A. Pt left in bed with call button within reach and bed alarm active. SLP to continue POC.   Pain Pain Assessment Pain Scale: 0-10 Pain Score: 0-No pain  Therapy/Group: Individual Therapy  Renaee Munda 03/13/2023, 4:01 PM

## 2023-03-13 NOTE — Progress Notes (Addendum)
Patient ID: Erica [name], female   DOB: 14-Sep-1955, 67 y.o.   MRN: [id number]  Patient d/c set for Wednesday 10/2    2:06 PM: Sw met with patient to discuss. Sw left detailed VM with patient sister, [name] discussing d/c. Sw will wait for FU.   2:45 PM: SW spoke with patient. Patient expressed that she is in the process of obtaining her own housing this week. Sw informed patient due to her requiring 24/7 supervision patient would need to d/c home with sister initally until able to maintain supervision at her new place.  SW waiting on FU from sister to confirm original d/c plan and ensure that sister and family are able to still provide 24/7 supervision at d/c.

## 2023-03-13 NOTE — Progress Notes (Signed)
PROGRESS NOTE   Subjective/Complaints:  Walked with pt as she was working with PT. She walked from room all the way to gym without a break! Utilizing foot up brace LLE. Generally doing well but still having a "brain freeze" like sensation/headache along the right side of her head which she typically experiences during activity. It's usually self-limited, and she's not taking anything for it.   ROS: Patient denies fever, rash, sore throat, blurred vision, dizziness, nausea, vomiting, diarrhea, cough, shortness of breath or chest pain, joint or back/neck pain,  or mood change.   Objective:   No results found. Recent Labs    03/13/23 0656  WBC 3.6*  HGB 10.8*  HCT 30.7*  PLT 133*    Recent Labs    03/13/23 0656  NA 140  K 4.3  CL 114*  CO2 20*  GLUCOSE 106*  BUN 28*  CREATININE 1.60*  CALCIUM 8.3*     Intake/Output Summary (Last 24 hours) at 03/13/2023 0916 Last data filed at 03/12/2023 2245 Gross per 24 hour  Intake 595 ml  Output --  Net 595 ml        Physical Exam: Vital Signs Blood pressure 139/70, pulse 69, temperature 98.3 F (36.8 C), temperature source Oral, resp. rate 18, height 5\' 3"  (1.6 m), weight 73.7 kg, SpO2 96%.   Constitutional: No distress . Vital signs reviewed. HEENT: NCAT, EOMI, oral membranes moist Neck: supple Cardiovascular: RRR without murmur. No JVD    Respiratory/Chest: CTA Bilaterally without wheezes or rales. Normal effort    GI/Abdomen: BS +, non-tender, non-distended Ext: no clubbing, cyanosis, or edema Psych: pleasant and cooperative  Skin: No evidence of breakdown, no evidence of rash Neurologic: Cranial nerves II through XII intact, motor strength is 5/5 in right and 4+ Left deltoid, bicep, tricep, grip, hip flexor, knee extensors, ankle dorsiflexor and plantar flexor Sensory exam normal sensation to light touch  in bilateral upper and lower extremities Cerebellar exam  normal finger to nose to finger as well as heel to shin in bilateral upper and lower extremities slower on the left side. Nice clearance of LLE during swing phase of gait using foot up brace. Musculoskeletal: Full range of motion in all 4 extremities. No joint swelling   Assessment/Plan: 1. Functional deficits which require 3+ hours per day of interdisciplinary therapy in a comprehensive inpatient rehab setting. Physiatrist is providing close team supervision and 24 hour management of active medical problems listed below. Physiatrist and rehab team continue to assess barriers to discharge/monitor patient progress toward functional and medical goals  Care Tool:  Bathing    Body parts bathed by patient: Right arm, Left arm, Chest, Abdomen, Front perineal area, Buttocks, Left upper leg, Right upper leg, Right lower leg, Left lower leg, Face         Bathing assist Assist Level: Set up assist     Upper Body Dressing/Undressing Upper body dressing   What is the patient wearing?: Bra, Pull over shirt    Upper body assist Assist Level: Set up assist    Lower Body Dressing/Undressing Lower body dressing      What is the patient wearing?: Underwear/pull up, Pants  Lower body assist Assist for lower body dressing: Set up assist     Toileting Toileting    Toileting assist Assist for toileting: Independent with assistive device     Transfers Chair/bed transfer  Transfers assist     Chair/bed transfer assist level: Contact Guard/Touching assist     Locomotion Ambulation   Ambulation assist      Assist level: Contact Guard/Touching assist Assistive device: Walker-rolling Max distance: 60ft   Walk 10 feet activity   Assist     Assist level: Contact Guard/Touching assist Assistive device: Walker-rolling   Walk 50 feet activity   Assist Walk 50 feet with 2 turns activity did not occur: Safety/medical concerns (orthostatic)         Walk 150 feet  activity   Assist Walk 150 feet activity did not occur: Safety/medical concerns         Walk 10 feet on uneven surface  activity   Assist Walk 10 feet on uneven surfaces activity did not occur: Safety/medical concerns         Wheelchair     Assist Is the patient using a wheelchair?: Yes Type of Wheelchair: Manual Wheelchair activity did not occur: Safety/medical concerns         Wheelchair 50 feet with 2 turns activity    Assist    Wheelchair 50 feet with 2 turns activity did not occur: Safety/medical concerns       Wheelchair 150 feet activity     Assist  Wheelchair 150 feet activity did not occur: Safety/medical concerns       Blood pressure 139/70, pulse 69, temperature 98.3 F (36.8 C), temperature source Oral, resp. rate 18, height 5\' 3"  (1.6 m), weight 73.7 kg, SpO2 96%.  Medical Problem List and Plan: 1. Functional deficits secondary to acute infarcts of bilateral centrum semiovale. Mild L HP slightly worsened from prior stroke              -patient may shower             -ELOS/Goals: 5-7 days             -Continue CIR therapies including PT, OT   -seems to be utilizing off the shelf foot up brace nicely LLE 2.  Antithrombotics: -DVT/anticoagulation:  Pharmaceutical: Lovenox             -antiplatelet therapy: DAPT X 3 weeks followed by ASA alont.  3. Peripheral neuropathy/right sided headache:    9/30 -will schedule tylenol 650mg  @ 0600 and 1200 daily prior to activity 4. Mood/Behavior/Sleep: LCSW to follow for evaluation and support             --melatonin prn for insomnia.              -antipsychotic agents: N/A 5. Neuropsych/cognition: This patient is not fully capable of making decisions on her own behalf. 6. Skin/Wound Care: Routine pressure relief measures.  7. Fluids/Electrolytes/Nutrition: Monitor I/O. Check CMET in am. 8. HTN: Monitor BP TID. Continue amlodipine 10 mg daily and Entresto Vitals:   03/13/23 0408 03/13/23 0738   BP: 123/69 139/70  Pulse: 63 69  Resp: 18 18  Temp: 98.2 F (36.8 C) 98.3 F (36.8 C)  SpO2: 100% 96%  9/30 reasonable control 9. T2DM: Hgb A1C-6.9.  Was on lantus 24 u am/18 u pm             - recommended to avoid drinks with added sugar.  CBG (last 3)  Recent Labs  03/12/23 1638 03/12/23 2056 03/13/23 0544  GLUCAP 194* 141* 118*  increased long acting insulin to 30U - starting 9/28, still less than total glargine dose at home, a.m. CBG under fair control 9/30 10 H/o COPD/IPF: Per records has chronic DOE/cough.             --on Pirfenidone TID. No MDI-->followed by Dr. Marchelle Gearing 11. GERD: On protonix  12. H/o Depression/anxiety/SA: Continue Effexor and Zoloft. Check magnesium level tomorrow.    13. Screening for vitamin D deficiency: check vitamin D level tomorrow   14.  CKD 3b- low GFR for >59yr, monitor for superimposed azotemia , no nephro toxic meds identified     Latest Ref Rng & Units 03/13/2023    6:56 AM 03/09/2023    6:53 AM 03/06/2023    4:39 AM  BMP  Glucose 70 - 99 mg/dL 409  811  914   BUN 8 - 23 mg/dL 28  19  22    Creatinine 0.44 - 1.00 mg/dL 7.82  9.56  2.13   Sodium 135 - 145 mmol/L 140  140  139   Potassium 3.5 - 5.1 mmol/L 4.3  4.1  3.7   Chloride 98 - 111 mmol/L 114  113  115   CO2 22 - 32 mmol/L 20  22  19    Calcium 8.9 - 10.3 mg/dL 8.3  8.4  8.1    0/86 increased BUN today--encourage fluids   -f/u later in week   LOS: 5 days A FACE TO FACE EVALUATION WAS PERFORMED  Ranelle Oyster 03/13/2023, 9:16 AM

## 2023-03-14 ENCOUNTER — Other Ambulatory Visit (HOSPITAL_COMMUNITY): Payer: Self-pay

## 2023-03-14 ENCOUNTER — Telehealth: Payer: Self-pay

## 2023-03-14 DIAGNOSIS — I639 Cerebral infarction, unspecified: Secondary | ICD-10-CM | POA: Diagnosis not present

## 2023-03-14 DIAGNOSIS — N1832 Chronic kidney disease, stage 3b: Secondary | ICD-10-CM | POA: Diagnosis not present

## 2023-03-14 DIAGNOSIS — E1121 Type 2 diabetes mellitus with diabetic nephropathy: Secondary | ICD-10-CM | POA: Diagnosis not present

## 2023-03-14 DIAGNOSIS — I10 Essential (primary) hypertension: Secondary | ICD-10-CM | POA: Diagnosis not present

## 2023-03-14 DIAGNOSIS — R1310 Dysphagia, unspecified: Secondary | ICD-10-CM

## 2023-03-14 LAB — GLUCOSE, CAPILLARY
Glucose-Capillary: 263 mg/dL — ABNORMAL HIGH (ref 70–99)
Glucose-Capillary: 127 mg/dL — ABNORMAL HIGH (ref 70–99)
Glucose-Capillary: 221 mg/dL — ABNORMAL HIGH (ref 70–99)

## 2023-03-14 MED ORDER — THIAMINE HCL 100 MG PO TABS
100.0000 mg | ORAL_TABLET | Freq: Every day | ORAL | 0 refills | Status: AC
  Filled 2023-03-14: qty 30, 30d supply, fill #0

## 2023-03-14 MED ORDER — CYANOCOBALAMIN 1000 MCG PO TABS
1000.0000 ug | ORAL_TABLET | Freq: Every day | ORAL | 0 refills | Status: AC
  Filled 2023-03-14: qty 130, 130d supply, fill #0

## 2023-03-14 MED ORDER — ENTRESTO 49-51 MG PO TABS
1.0000 | ORAL_TABLET | Freq: Two times a day (BID) | ORAL | 0 refills | Status: DC
  Filled 2023-03-14: qty 60, 30d supply, fill #0

## 2023-03-14 MED ORDER — CLOPIDOGREL BISULFATE 75 MG PO TABS
75.0000 mg | ORAL_TABLET | Freq: Every day | ORAL | 0 refills | Status: DC
  Filled 2023-03-14: qty 12, 12d supply, fill #0

## 2023-03-14 MED ORDER — ASPIRIN 81 MG PO TBEC
81.0000 mg | DELAYED_RELEASE_TABLET | Freq: Every day | ORAL | 12 refills | Status: DC
  Filled 2023-03-14: qty 120, 120d supply, fill #0

## 2023-03-14 MED ORDER — SERTRALINE HCL 50 MG PO TABS
50.0000 mg | ORAL_TABLET | Freq: Every day | ORAL | 0 refills | Status: DC
  Filled 2023-03-14: qty 30, 30d supply, fill #0

## 2023-03-14 MED ORDER — TRAZODONE HCL 100 MG PO TABS
100.0000 mg | ORAL_TABLET | Freq: Every day | ORAL | 0 refills | Status: DC
  Filled 2023-03-14: qty 30, 30d supply, fill #0

## 2023-03-14 MED ORDER — AMLODIPINE BESYLATE 10 MG PO TABS
10.0000 mg | ORAL_TABLET | Freq: Every day | ORAL | 0 refills | Status: DC
  Filled 2023-03-14: qty 30, 30d supply, fill #0

## 2023-03-14 MED ORDER — NAPHAZOLINE-GLYCERIN 0.012-0.25 % OP SOLN: 1.0000 [drp] | Freq: Three times a day (TID) | OPHTHALMIC | Status: AC

## 2023-03-14 MED ORDER — ACETAMINOPHEN 325 MG PO TABS: 650.0000 mg | ORAL_TABLET | Freq: Two times a day (BID) | ORAL | Status: DC

## 2023-03-14 MED ORDER — VENLAFAXINE HCL ER 150 MG PO CP24
150.0000 mg | ORAL_CAPSULE | Freq: Every day | ORAL | 0 refills | Status: DC
  Filled 2023-03-14: qty 30, 30d supply, fill #0

## 2023-03-14 MED ORDER — INSULIN GLARGINE-YFGN 100 UNIT/ML ~~LOC~~ SOLN: 30.0000 [IU] | Freq: Every day | SUBCUTANEOUS | Status: DC

## 2023-03-14 MED ORDER — GABAPENTIN 100 MG PO CAPS
100.0000 mg | ORAL_CAPSULE | Freq: Two times a day (BID) | ORAL | 0 refills | Status: DC
  Filled 2023-03-14: qty 60, 30d supply, fill #0

## 2023-03-14 MED ORDER — PHENOL 1.4 % MT LIQD: 1.0000 | OROMUCOSAL | Status: DC | PRN

## 2023-03-14 MED ORDER — ROSUVASTATIN CALCIUM 40 MG PO TABS
40.0000 mg | ORAL_TABLET | Freq: Every day | ORAL | 0 refills | Status: DC
  Filled 2023-03-14: qty 30, 30d supply, fill #0

## 2023-03-14 NOTE — Progress Notes (Signed)
Patient ID: Erica [name], female   DOB: 1955-12-26, 67 y.o.   MRN: [id number]   Sw met with patient to inform patient that SW has not received a response from her sister, [name]. Patient reports sister will be present today before 3 PM. Sw will continue to FU.   Patient requesting an earlier discharge to allow her to attend a DSS appointment to receive assistance with her utilities. Sw will discuss a d/c between 9-10 AM with team. No additional questions or concerns.

## 2023-03-14 NOTE — Telephone Encounter (Signed)
Patients monitor has been disconnected for 76 days. Can someone f/u on this. Looks like their scheduled remote will not process.

## 2023-03-14 NOTE — Progress Notes (Signed)
Patient ID: Erica [name], female   DOB: [birth year], 67 y.o.   MRN: [id number]  SW met with pt's sister, [name]. Pt and sister prefer HH for d/c. Sister and family providing 24/7 supervision. Sister will be present tomorrow between 9-11 AM.

## 2023-03-14 NOTE — Progress Notes (Signed)
Patient ID: Erica [name], female   DOB: 1956-01-22, 67 y.o.   MRN: [id number]  Patient refused all 0800 medications. Semglee given. Patient would tell this RN as to why she was refusing. Notified Kirsteins, MD. Patient tearful this morning and has limited interaction with staff.

## 2023-03-14 NOTE — Progress Notes (Signed)
Physical Therapy Session Note  Patient Details  Name: KENZINGTON MIELKE MRN: [id number] Date of Birth: Mar 05, 1956  Today's Date: 03/14/2023 PT Individual Time: 1116-1200 PT Individual Time Calculation (min): 44 min   Short Term Goals: Week 1:  PT Short Term Goal 1 (Week 1): = LTG due to ELOS Week 2:     Skilled Therapeutic Interventions/Progress Updates:      Therapy Documentation Precautions:  Precautions Precautions: Fall Restrictions Weight Bearing Restrictions: No  Pain: Pt with no pain complaint this session.    Therapy/Group: Individual Therapy  Loel Dubonnet PT, DPT, CSRS 03/14/2023, 6:08 PM

## 2023-03-14 NOTE — Plan of Care (Signed)
  Problem: RH Balance Goal: LTG Patient will maintain dynamic standing with ADLs (OT) Description: LTG:  Patient will maintain dynamic standing balance with assist during activities of daily living (OT)  Outcome: Completed/Met   Problem: Sit to Stand Goal: LTG:  Patient will perform sit to stand in prep for activites of daily living with assistance level (OT) Description: LTG:  Patient will perform sit to stand in prep for activites of daily living with assistance level (OT) Outcome: Completed/Met   Problem: RH Grooming Goal: LTG Patient will perform grooming w/assist,cues/equip (OT) Description: LTG: Patient will perform grooming with assist, with/without cues using equipment (OT) Outcome: Completed/Met   Problem: RH Bathing Goal: LTG Patient will bathe all body parts with assist levels (OT) Description: LTG: Patient will bathe all body parts with assist levels (OT) Outcome: Completed/Met   Problem: RH Dressing Goal: LTG Patient will perform upper body dressing (OT) Description: LTG Patient will perform upper body dressing with assist, with/without cues (OT). Outcome: Completed/Met Goal: LTG Patient will perform lower body dressing w/assist (OT) Description: LTG: Patient will perform lower body dressing with assist, with/without cues in positioning using equipment (OT) Outcome: Completed/Met   Problem: RH Toileting Goal: LTG Patient will perform toileting task (3/3 steps) with assistance level (OT) Description: LTG: Patient will perform toileting task (3/3 steps) with assistance level (OT)  Outcome: Completed/Met   Problem: RH Simple Meal Prep Goal: LTG Patient will perform simple meal prep w/assist (OT) Description: LTG: Patient will perform simple meal prep with assistance, with/without cues (OT). Outcome: Completed/Met   Problem: RH Laundry Goal: LTG Patient will perform laundry w/assist, cues (OT) Description: LTG: Patient will perform laundry with assistance,  with/without cues (OT). Outcome: Completed/Met   Problem: RH Light Housekeeping Goal: LTG Patient will perform light housekeeping w/assist (OT) Description: LTG: Patient will perform light housekeeping with assistance, with/without cues (OT). Outcome: Completed/Met   Problem: RH Toilet Transfers Goal: LTG Patient will perform toilet transfers w/assist (OT) Description: LTG: Patient will perform toilet transfers with assist, with/without cues using equipment (OT) Outcome: Completed/Met   Problem: RH Tub/Shower Transfers Goal: LTG Patient will perform tub/shower transfers w/assist (OT) Description: LTG: Patient will perform tub/shower transfers with assist, with/without cues using equipment (OT) Outcome: Completed/Met   

## 2023-03-14 NOTE — Progress Notes (Signed)
Physical Therapy Discharge Summary  Patient Details  Name: Erica [name] MRN: [id number] Date of Birth: [birth year]  Date of Discharge from PT service:March 14, 2023  Today's Date: 03/14/2023 PT Individual Time: 5284-1324 PT Individual Time Calculation (min): 59 min    Patient has met {NUMBERS 0-12:18577} of {NUMBERS 0-12:18577} long term goals due to {due MW:1027253}.  Patient to discharge at [facility] level {LOA:3049010}.   Patient's care partner {care partner:3041650} to provide the necessary {assistance:3041652} assistance at discharge.  Reasons goals not met: ***  Recommendation:  Patient will benefit from ongoing skilled PT services in {setting:3041680} to continue to advance safe functional mobility, address ongoing impairments in ***, and minimize fall risk.  Equipment: RW  Reasons for discharge: treatment goals met and discharge from hospital  Patient/family agrees with progress made and goals achieved: Yes  PT Discharge Precautions/Restrictions   Vital Signs   Pain   Pain Interference   Vision/Perception     Cognition   Sensation   Motor     Mobility   Locomotion     Trunk/Postural Assessment     Balance   Extremity Assessment            Loel Dubonnet PT, DPT, CSRS 03/14/2023, 6:10 PM

## 2023-03-14 NOTE — Progress Notes (Signed)
Occupational Therapy Discharge Summary  Patient Details  Name: Erica [name] MRN: [id number] Date of Birth: December 30, 1955  Date of Discharge from OT service:March 14, 2023   Patient has met 12 of 12 long term goals due to improved activity tolerance, improved balance, postural control, ability to compensate for deficits, functional use of  LEFT upper and LEFT lower extremity, improved attention, improved awareness, and improved coordination.  Patient to discharge at overall Modified Independent level.  Patient's care partner is independent to provide the necessary physical and cognitive assistance at discharge.    Her sister called and I reviewed with sister that pt is now Modified independent with her self care.  She can also do like housekeeping and laundry.  She can do some very basic meal prep with "non hot" foods like sandwiches.  She does not need 24/7 supervision but intermittent supervision for high level I ADLs.  Recommended pt continue to stay with sister for at least a week or 2 prior to pt moving into her own apartment to help pt transition.     Reasons goals not met: n/a  Recommendation:  Patient will benefit from ongoing skilled OT services in outpatient setting to continue to advance functional skills in the area of iADL.  Equipment: Shower seat  Reasons for discharge: treatment goals met  Patient/family agrees with progress made and goals achieved: Yes  OT Discharge Precautions/Restrictions  Precautions Precautions: Fall   ADL ADL Eating: Independent Grooming: Independent Where Assessed-Grooming: Standing at sink Upper Body Bathing: Independent Where Assessed-Upper Body Bathing: Shower Lower Body Bathing: Modified independent Where Assessed-Lower Body Bathing: Shower Upper Body Dressing: Independent Lower Body Dressing: Modified independent Toileting: Modified independent Where Assessed-Toileting: Teacher, adult education: Engineer, agricultural  Method: Proofreader: Acupuncturist: Directs care (Comment) Film/video editor Method: Designer, industrial/product: Grab bars, Sales promotion account executive Baseline Vision/History: 1 Wears glasses Patient Visual Report: No change from baseline Vision Assessment?: No apparent visual deficits Perception  Perception: Within Functional Limits Praxis Praxis: WFL Cognition Cognition Overall Cognitive Status: Impaired/Different from baseline Arousal/Alertness: Awake/alert Memory: Impaired Memory Impairment: Storage deficit;Retrieval deficit;Decreased short term memory Decreased Short Term Memory: Verbal basic;Functional basic Focused Attention: Appears intact Sustained Attention: Appears intact Selective Attention: Appears intact Awareness: Appears intact Problem Solving: Impaired Problem Solving Impairment: Verbal complex;Functional complex Safety/Judgment: Appears intact Brief Interview for Mental Status (BIMS) Repetition of Three Words (First Attempt): 3 Temporal Orientation: Year: Correct Temporal Orientation: Month: Accurate within 5 days Temporal Orientation: Day: Correct Recall: "Sock": Yes, no cue required Recall: "Blue": Yes, no cue required Recall: "Bed": Yes, no cue required BIMS Summary Score: 15 Sensation Sensation Light Touch: Impaired by gross assessment Light Touch Impaired Details: Impaired LUE Hot/Cold: Appears Intact Proprioception: Appears Intact Coordination Gross Motor Movements are Fluid and Coordinated: No Fine Motor Movements are Fluid and Coordinated: Yes Coordination and Movement Description: able to fasten bra, open containers Finger Nose Finger Test: slow but functional with B hands Motor  Motor Motor: Hemiplegia Motor - Skilled Clinical Observations: mild L hemiplegia Mobility    Mod I with RW for ADL transfers and ambulation in room  Trunk/Postural Assessment  Postural Control Postural  Control: Within Functional Limits  Balance Static Sitting Balance Static Sitting - Level of Assistance: 7: Independent Dynamic Sitting Balance Dynamic Sitting - Level of Assistance: 7: Independent Static Standing Balance Static Standing - Level of Assistance: 7: Independent Dynamic Standing Balance Dynamic Standing - Level of Assistance: 6: Modified independent (  Device/Increase time) Extremity/Trunk Assessment RUE Assessment RUE Assessment: Within Functional Limits LUE Assessment Active Range of Motion (AROM) Comments: sh flexion and abduction to 150 (pt reports old RTC injury) General Strength Comments: 4-/5   Juliona Vales 03/14/2023, 12:33 PM

## 2023-03-14 NOTE — Telephone Encounter (Signed)
Dr. Jacinto Halim sent patient a certified letter.

## 2023-03-14 NOTE — Discharge Instructions (Addendum)
Inpatient Rehab Discharge Instructions  Erica [name] Discharge date and time:    Activities/Precautions/ Functional Status: Activity: no lifting, driving, or strenuous exercise till cleared by MD Diet: cardiac diet and diabetic diet Wound Care: none needed   Functional status:  ___ No restrictions     ___ Walk up steps independently ___ 24/7 supervision/assistance   ___ Walk up steps with assistance _X__ Intermittent supervision/assistance  ___ Bathe/dress independently ___ Walk with walker     ___ Bathe/dress with assistance ___ Walk Independently    ___ Shower independently ___ Walk with assistance    _X__ Shower with assistance _X__ No alcohol     ___ Return to work/school ________  COMMUNITY REFERRALS UPON DISCHARGE:    Home Health:   PT     OT                  Agency: [facility] Phone: [phone]    Medical Equipment/Items Ordered: Levan Hurst, Bedside Commode and Tub Transfer Bench                                                 Agency/Supplier: [employer] [phone]    Special Instructions:  STROKE/TIA DISCHARGE INSTRUCTIONS SMOKING Cigarette smoking nearly doubles your risk of having a stroke & is the single most alterable risk factor  If you smoke or have smoked in the last 12 months, you are advised to quit smoking for your health. Most of the excess cardiovascular risk related to smoking disappears within a year of stopping. Ask you doctor about anti-smoking medications [place] Quit Line: 1-800-QUIT NOW Free Smoking Cessation Classes [phone]  CHOLESTEROL Know your levels; limit fat & cholesterol in your diet  Lipid Panel     Component Value Date/Time   CHOL 161 03/05/2023 0431   CHOL 132 03/08/2021 1143   TRIG 115 03/05/2023 0431   HDL 52 03/05/2023 0431   HDL 76 03/08/2021 1143   CHOLHDL 3.1 03/05/2023 0431   VLDL 23 03/05/2023 0431   LDLCALC 86 03/05/2023 0431   LDLCALC 31 07/16/2021 0000     Many patients benefit from treatment even if their  cholesterol is at goal. Goal: Total Cholesterol (CHOL) less than 160 Goal:  Triglycerides (TRIG) less than 150 Goal:  HDL greater than 40 Goal:  LDL (LDLCALC) less than 100   BLOOD PRESSURE American Stroke Association blood pressure target is less that 120/80 mm/Hg  Your discharge blood pressure is:  BP: 91/75 Monitor your blood pressure Limit your salt and alcohol intake Many individuals will require more than one medication for high blood pressure  DIABETES (A1c is a blood sugar average for last 3 months) Goal HGBA1c is under 7% (HBGA1c is blood sugar average for last 3 months)  Diabetes:     Lab Results  Component Value Date   HGBA1C 6.9 (H) 03/05/2023    Your HGBA1c can be lowered with medications, healthy diet, and exercise. Check your blood sugar as directed by your physician Call your physician if you experience unexplained or low blood sugars.  PHYSICAL ACTIVITY/REHABILITATION Goal is 30 minutes at least 4 days per week  Activity: No driving, Therapies: see above Return to work: N/A Activity decreases your risk of heart attack and stroke and makes your heart stronger.  It helps control your weight and blood pressure; helps you relax and can improve  your mood. Participate in a regular exercise program. Talk with your doctor about the best form of exercise for you (dancing, walking, swimming, cycling).  DIET/WEIGHT Goal is to maintain a healthy weight  Your discharge diet is:  Diet Order             Diet heart healthy/carb modified Fluid consistency: Thin  Diet effective now                   liquids Your height is:  Height: 5\' 3"  (160 cm) Your current weight is: Weight: 162 lbs  Your Body Mass Index (BMI) is:  BMI (Calculated): 28.79 Following the type of diet specifically designed for you will help prevent another stroke. Your goal weight is:  141 lbs Your goal Body Mass Index (BMI) is 19-24. Healthy food habits can help reduce 3 risk factors for stroke:  High  cholesterol, hypertension, and excess weight.  RESOURCES Stroke/Support Group:  Call [phone]   STROKE EDUCATION PROVIDED/REVIEWED AND GIVEN TO PATIENT Stroke warning signs and symptoms How to activate emergency medical system (call 911). Medications prescribed at discharge. Need for follow-up after discharge. Personal risk factors for stroke. Pneumonia vaccine given:  Flu vaccine given:  My questions have been answered, the writing is legible, and I understand these instructions.  I will adhere to these goals & educational materials that have been provided to me after my discharge from the hospital.     My questions have been answered and I understand these instructions. I will adhere to these goals and the provided educational materials after my discharge from the hospital.  Patient/Caregiver Signature _______________________________ Date __________  Clinician Signature _______________________________________ Date __________  Please bring this form and your medication list with you to all your follow-up doctor's appointments.

## 2023-03-14 NOTE — Progress Notes (Signed)
Patient ID: Erica [name], female   DOB: Aug 15, 1955, 67 y.o.   MRN: [id number]  SW left detailed VM with pt sister, [name]. Sw will follow up with patient today to discuss.

## 2023-03-14 NOTE — Plan of Care (Signed)
°  Problem: RH Expression Communication °Goal: LTG Patient will increase speech intelligibility (SLP) °Description: LTG: Patient will increase speech intelligibility at word/phrase/conversation level with cues, % of the time (SLP) °Outcome: Completed/Met °  °Problem: RH Problem Solving °Goal: LTG Patient will demonstrate problem solving for (SLP) °Description: LTG:  Patient will demonstrate problem solving for basic/complex daily situations with cues  (SLP) °Outcome: Completed/Met °  °Problem: RH Memory °Goal: LTG Patient will use memory compensatory aids to (SLP) °Description: LTG:  Patient will use memory compensatory aids to recall biographical/new, daily complex information with cues (SLP) °Outcome: Completed/Met °  °

## 2023-03-14 NOTE — Progress Notes (Addendum)
Speech Language Pathology Discharge Summary  Patient Details  Name: Erica [name] MRN: [id number] Date of Birth: 03-06-1956  Date of Discharge from SLP service:March 14, 2023  Today's Date: 03/14/2023 SLP Individual Time: 5621-3086 SLP Individual Time Calculation (min): 45 min   Skilled Therapeutic Interventions:   Skilled therapy session focused on dysphagia and cognitive goals. SLP re-evaluted swallowing function per MD request. Patient consumed thin liquids, purees and solids with no s/sx of aspiration, timely mastication and adequate oral clearance. Patient with complaints of choking episode yesterday, though none since. Recommend continuation of current diet with medications administered in water, one at a time. Standardized precautions (small bites/sips, slow rate, etc) Of note, patient complaining of sore throat and hoarse vocal quality. SLP re-evaluated cognitive skills through use of the Cognistat. Patient with mild-moderate deficits in memory and visual construction, though improvements in calculations from admission. Patient able to recall memory strategies discussed in prior sessions and complete memory notebook for todays sessions. Patient left in chair with alarm set and call bell in reach.    Patient has met 3 of 3 long term goals.  Patient to discharge at overall Supervision level.  Reasons goals not met: n/a   Clinical Impression/Discharge Summary:  Pt has made excellent gains and has met 3 of 3 LTG's this admission due to improved cognition and speech intelligibility. Pt is currently an overall supervision A for cognitive tasks and requires supervision cues for utilization of speech compensatory strategies to increase speech intelligibility. Pt/family education complete and pt will discharge with 24 hour supervision from friends/family/etc.  This SLP does not recommend follow up ST intervention at this time due to b/l cognitive deficits (now able to recall and utilize strategies  for compensation) and adequate support at d/c.   Care Partner:  Caregiver Able to Provide Assistance: Yes  Type of Caregiver Assistance: Physical;Cognitive  Recommendation:  None      Equipment: n/a   Reasons for discharge: Discharged from hospital;Treatment goals met   Patient/Family Agrees with Progress Made and Goals Achieved: Yes    Julyssa Kyer M.A., CF-SLP 03/14/2023, 12:13 PM

## 2023-03-14 NOTE — Patient Care Conference (Signed)
Inpatient RehabilitationTeam Conference and Plan of Care Update Date: 03/13/2023   Time: 13:45 PM    Patient Name: Erica [name]      Medical Record Number: [id number]  Date of Birth: September 04, 1955 Sex: Female         Room/Bed: 4W07C/4W07C-01 Payor Info: Payor: [employer] MEDICARE / Plan: [employer] MEDICARE HMO / Product Type: *No Product type* /    Admit Date/Time:  03/08/2023 12:59 PM  Primary Diagnosis:  Stroke (cerebrum) [facility])  Hospital Problems: Principal Problem:   Stroke (cerebrum) [facility])    Expected Discharge Date: Expected Discharge Date: 03/15/23  Team Members Present: Physician leading conference: Dr. Faith Rogue Social Worker Present: Lavera Guise, BSW Nurse Present: Chana Bode, RN PT Present: Ralph Leyden, PT OT Present: Primitivo Gauze, OT SLP Present: Pablo Lawrence, SLP     Current Status/Progress Goal Weekly Team Focus  Bowel/Bladder   Continent of B/B. Last BM 03/13/23  Continent of bowel and bladder Maintain continence   Assis with BR privileges    Swallow/Nutrition/ Hydration   regular/thin           ADL's   mod I with BADLs   Mod I with BADLs   pt education completed, family ed completed via phone, pt is ready for discharge to home    Mobility   Bed mobility = Mod I, transfers = Mod I/ sup, ambulation with RW up to 200 ft with supervision   overall Mod I/ supervision  discharging home with sister 10/2    Communication   95% intelligibile at the conversational level   supervision A for 95% intelligibility at conversational level   d/c    Safety/Cognition/ Behavioral Observations  goals met   supervision A   d/c    Pain   Denies   Remain pain free   Assess Q4 and prn    Skin   Intact   Maintain integrity  Assess QS      Discharge Planning:  Patient discharging home tomorrow   Team Discussion: Patient was doing well; had an episode of "choking" that upset her. Afterwards she was not herself; very flat and not interactive at  all and refused morning meds. Reassured patient and successfully re-attempted medication administration one at a time with water.  Patient on target to meet rehab goals: yes  *See Care Plan and progress notes for long and short-term goals.   Revisions to Treatment Plan:  SLP re-eval ordered   Teaching Needs: Safety, medications, dietary modifications, transfers, toileting, etc.   Current Barriers to Discharge: None  Possible Resolutions to Barriers: Family education OP follow up services DME: RW,  and Shower seat     Medical Summary Current Status: choking episode, evaluated by SLP no dysphagia suspected.  No recurrent episode, chronic L HP persisting  Barriers to Discharge: Other (comments)  Barriers to Discharge Comments: swallowing issues Possible Resolutions to Barriers/Weekly Focus: d/c home with family after SLP instructions   Continued Need for Acute Rehabilitation Level of Care: The patient requires daily medical management by a physician with specialized training in physical medicine and rehabilitation for the following reasons: Direction of a multidisciplinary physical rehabilitation program to maximize functional independence : Yes Medical management of patient stability for increased activity during participation in an intensive rehabilitation regime.: Yes Analysis of laboratory values and/or radiology reports with any subsequent need for medication adjustment and/or medical intervention. : Yes   I attest that I was present, lead the team conference, and concur with the assessment and  plan of the team.   Pamelia Hoit 03/16/2023, 9:29 AM

## 2023-03-14 NOTE — Progress Notes (Signed)
Occupational Therapy Session Note  Patient Details  Name: ALAYJA ARMAS MRN: [id number] Date of Birth: [birth year]  Today's Date: 03/14/2023 OT Individual Time: 8657-8469 OT Individual Time Calculation (min): 60 min    Short Term Goals: Week 1:  OT Short Term Goal 1 (Week 1): STGs = LTGs    Skilled Therapeutic Interventions/Progress Updates:    Pt received in bed, very quiet and not engaging initially.  Discussed what is bothering her and finally came to conclusion that she was still preoccupied with the incident she had yesterday before lunch in which she described "I got choked up on my saliva.  It scared me because I felt like I could not breathe".  Discussed her fear and reassured her that her saliva would not choke her.  Her sister called and I reviewed with sister that pt is now Modified independent with her self care.  She can also do like housekeeping and laundry.  She can do some very basic meal prep with "non hot" foods like sandwiches.  She does not need 24/7 supervision but intermittent supervision for high level I ADLs.  Recommended pt continue to stay with sister for at least a week or 2 prior to pt moving into her own apartment to help pt transition.   Pt was able to get up and use the bathroom with her RW, completed oral care in standing,  donned socks and shoes with brace with no A needed.   Pt requesting to ambulate to vending machine to buy fruit snacks as she felt they would help with her throat.  Pt took her coin purse.  After finding out how much the candy would cost, pt sat down to sort through her coins counting her money correctly.  Pt purchased the candy.  Provided pt with walker bag to carry her coin purse and candy.  Pt ambulated back to her room and sat in recliner. Alarm on and all needs met.      Therapy Documentation Precautions:  Precautions Precautions: Fall Restrictions Weight Bearing Restrictions: No   Pain: no c/o pain    ADL: ADL Eating:  Independent Grooming: Independent Where Assessed-Grooming: Standing at sink Upper Body Bathing: Independent Where Assessed-Upper Body Bathing: Shower Lower Body Bathing: Modified independent Where Assessed-Lower Body Bathing: Shower Upper Body Dressing: Independent Lower Body Dressing: Modified independent Toileting: Modified independent Where Assessed-Toileting: Teacher, adult education: Engineer, agricultural Method: Proofreader: Acupuncturist: Directs care (Comment) Film/video editor Method: Designer, industrial/product: Grab bars, Transfer tub bench     Therapy/Group: Individual Therapy  Raudel Bazen 03/14/2023, 8:59 AM

## 2023-03-14 NOTE — Progress Notes (Signed)
PROGRESS NOTE   Subjective/Complaints:   No issues overnite, pt had a choking episode and is afraid to swallow this am , refused am meds.  Also has a mild sore throat  ROS: Patient denies CP, SOB, N/V/D  Objective:   No results found. Recent Labs    03/13/23 0656  WBC 3.6*  HGB 10.8*  HCT 30.7*  PLT 133*    Recent Labs    03/13/23 0656  NA 140  K 4.3  CL 114*  CO2 20*  GLUCOSE 106*  BUN 28*  CREATININE 1.60*  CALCIUM 8.3*     Intake/Output Summary (Last 24 hours) at 03/14/2023 0901 Last data filed at 03/13/2023 1821 Gross per 24 hour  Intake 474 ml  Output --  Net 474 ml        Physical Exam: Vital Signs Blood pressure (!) 154/79, pulse 69, temperature 97.8 F (36.6 C), temperature source Oral, resp. rate 18, height 5\' 3"  (1.6 m), weight 73.7 kg, SpO2 100%.  EENT-  troat clear tongue midline   General: No acute distress Mood and affect are appropriate Heart: Regular rate and rhythm no rubs murmurs or extra sounds Lungs: Clear to auscultation, breathing unlabored, no rales or wheezes Abdomen: Positive bowel sounds, soft nontender to palpation, nondistended Extremities: No clubbing, cyanosis, or edema  Musculoskeletal: Full range of motion in all 4 extremities. No joint swelling  Skin: No evidence of breakdown, no evidence of rash Neurologic: Cranial nerves II through XII intact, motor strength is 5/5 in right and 4+ Left deltoid, bicep, tricep, grip, hip flexor, knee extensors, ankle dorsiflexor and plantar flexor Sensory exam normal sensation to light touch  in bilateral upper and lower extremities Cerebellar exam normal finger to nose to finger as well as heel to shin in bilateral upper and lower extremities slower on the left side. Nice clearance of LLE during swing phase of gait using foot up brace. Musculoskeletal: Full range of motion in all 4 extremities. No joint swelling    Assessment/Plan: 1. Functional deficits which require 3+ hours per day of interdisciplinary therapy in a comprehensive inpatient rehab setting. Physiatrist is providing close team supervision and 24 hour management of active medical problems listed below. Physiatrist and rehab team continue to assess barriers to discharge/monitor patient progress toward functional and medical goals  Care Tool:  Bathing    Body parts bathed by patient: Right arm, Left arm, Chest, Abdomen, Front perineal area, Buttocks, Left upper leg, Right upper leg, Right lower leg, Left lower leg, Face         Bathing assist Assist Level: Independent with assistive device     Upper Body Dressing/Undressing Upper body dressing   What is the patient wearing?: Bra, Pull over shirt    Upper body assist Assist Level: Independent    Lower Body Dressing/Undressing Lower body dressing      What is the patient wearing?: Underwear/pull up, Pants     Lower body assist Assist for lower body dressing: Independent with assitive device     Toileting Toileting    Toileting assist Assist for toileting: Independent with assistive device     Transfers Chair/bed transfer  Transfers assist  Chair/bed transfer assist level: Contact Guard/Touching assist     Locomotion Ambulation   Ambulation assist      Assist level: Contact Guard/Touching assist Assistive device: Walker-rolling Max distance: 12ft   Walk 10 feet activity   Assist     Assist level: Contact Guard/Touching assist Assistive device: Walker-rolling   Walk 50 feet activity   Assist Walk 50 feet with 2 turns activity did not occur: Safety/medical concerns (orthostatic)         Walk 150 feet activity   Assist Walk 150 feet activity did not occur: Safety/medical concerns         Walk 10 feet on uneven surface  activity   Assist Walk 10 feet on uneven surfaces activity did not occur: Safety/medical concerns          Wheelchair     Assist Is the patient using a wheelchair?: Yes Type of Wheelchair: Manual Wheelchair activity did not occur: Safety/medical concerns         Wheelchair 50 feet with 2 turns activity    Assist    Wheelchair 50 feet with 2 turns activity did not occur: Safety/medical concerns       Wheelchair 150 feet activity     Assist  Wheelchair 150 feet activity did not occur: Safety/medical concerns       Blood pressure (!) 154/79, pulse 69, temperature 97.8 F (36.6 C), temperature source Oral, resp. rate 18, height 5\' 3"  (1.6 m), weight 73.7 kg, SpO2 100%.  Medical Problem List and Plan: 1. Functional deficits secondary to acute infarcts of bilateral centrum semiovale. Mild L HP slightly worsened from prior stroke              -patient may shower             -ELOS/Goals: 5-7 days             -Continue CIR therapies including PT, OT   -seems to be utilizing off the shelf foot up brace nicely LLE 2.  Antithrombotics: -DVT/anticoagulation:  Pharmaceutical: Lovenox             -antiplatelet therapy: DAPT X 3 weeks followed by ASA alont.  3. Peripheral neuropathy/right sided headache:    9/30 -will schedule tylenol 650mg  @ 0600 and 1200 daily prior to activity 4. Mood/Behavior/Sleep: LCSW to follow for evaluation and support             --melatonin prn for insomnia.              -antipsychotic agents: N/A 5. Neuropsych/cognition: This patient is not fully capable of making decisions on her own behalf. 6. Skin/Wound Care: Routine pressure relief measures.  7. Fluids/Electrolytes/Nutrition: Monitor I/O. Check CMET in am. 8. HTN: Monitor BP TID. Continue amlodipine 10 mg daily and Entresto Vitals:   03/13/23 2026 03/14/23 0341  BP: 130/74 (!) 154/79  Pulse: 67 69  Resp: 18 18  Temp: 98 F (36.7 C) 97.8 F (36.6 C)  SpO2: 100% 100%  9/30 reasonable control 9. T2DM: Hgb A1C-6.9.  Was on lantus 24 u am/18 u pm             - recommended to avoid drinks  with added sugar.  CBG (last 3)  Recent Labs    03/13/23 1159 03/13/23 1707 03/13/23 2034  GLUCAP 156* 203* 169*  increased long acting insulin to 30U - starting 9/28, still less than total glargine dose at home, a.m. CBG under fair control 9/30 10 H/o COPD/IPF: Per  records has chronic DOE/cough.             --on Pirfenidone TID. No MDI-->followed by Dr. Marchelle Gearing 11. GERD: On protonix  12. H/o Depression/anxiety/SA: Continue Effexor and Zoloft. Check magnesium level tomorrow.    13. Screening for vitamin D deficiency: check vitamin D level tomorrow   14.  CKD 3b- low GFR for >65yr, monitor for superimposed azotemia , no nephro toxic meds identified     Latest Ref Rng & Units 03/13/2023    6:56 AM 03/09/2023    6:53 AM 03/06/2023    4:39 AM  BMP  Glucose 70 - 99 mg/dL 528  413  244   BUN 8 - 23 mg/dL 28  19  22    Creatinine 0.44 - 1.00 mg/dL 0.10  2.72  5.36   Sodium 135 - 145 mmol/L 140  140  139   Potassium 3.5 - 5.1 mmol/L 4.3  4.1  3.7   Chloride 98 - 111 mmol/L 114  113  115   CO2 22 - 32 mmol/L 20  22  19    Calcium 8.9 - 10.3 mg/dL 8.3  8.4  8.1    6/44 increased BUN today--encourage fluids   -f/u later in week  15.  ? Dysphagia vs sporadic aspiration event, no signs of significant aspiratin , will ask RN to give meds in applesauce, ask SLP to eval swallow  LOS: 6 days A FACE TO FACE EVALUATION WAS PERFORMED  Erick Colace 03/14/2023, 9:01 AM

## 2023-03-14 NOTE — Discharge Summary (Signed)
Physician Discharge Summary  Patient ID: Erica [name] MRN: [id number] DOB/AGE: Nov 04, 1955 67 y.o.  Admit date: 03/08/2023 Discharge date: 03/15/2023  Discharge Diagnoses:  Principal Problem:   Stroke (cerebrum) [facility])   Discharged Condition: stable  Significant Diagnostic Studies:   Labs:  Basic Metabolic Panel: Recent Labs  Lab 03/13/23 0656 03/15/23 0517  NA 140 139  K 4.3 4.0  CL 114* 112*  CO2 20* 18*  GLUCOSE 106* 134*  BUN 28* 28*  CREATININE 1.60* 1.57*  CALCIUM 8.3* 8.5*    CBC:    Latest Ref Rng & Units 03/13/2023    6:56 AM 03/09/2023    6:53 AM 03/06/2023    4:39 AM  CBC  WBC 4.0 - 10.5 K/uL 3.6  4.5  3.8   Hemoglobin 12.0 - 15.0 g/dL 11.9  14.7  82.9   Hematocrit 36.0 - 46.0 % 30.7  28.9  27.5   Platelets 150 - 400 K/uL 133  143  146      CBG: Recent Labs  Lab 03/14/23 1142 03/14/23 1620 03/14/23 2053 03/15/23 0620 03/15/23 1140  GLUCAP 263* 127* 221* 129* 171*    Brief HPI:   Erica [name] is a 67 y.o. female with history of HTN, T2DM, depression, prior CVA with left sided residual deficits, pulmonary fibrosis, COPD who was admitted to [facility] on 03/04/23 with mental status changes and difficulty rousing from sleep. She was found to have small acute to subacute infarcts in bilateral centrum ovale which were felt to be due to small vessel disease. Neurology recommended DAPT X 21 days followed by ASA as patient already has loop recorder. Therapy was working with patient who was limited by ataxia, fatigue, and LLE weakness. CIR was recommended due to functional decline.     Hospital Course: Erica [name] was admitted to rehab 03/08/2023 for inpatient therapies to consist of PT, ST and OT at least three hours five days a week. Past admission physiatrist, therapy team and rehab RN have worked together to provide customized collaborative inpatient rehab.  Her blood pressures were monitored on TID basis and has been controlled on current regimen.  Respiratory  status has been stable on for pirfenidone and she was advised to resume Symbicort after discharge.  Team has provided ego support during his stay.  Labs have been monitored during his stay showing serum creatinine at baseline.  She was advised to increase fluid intake to help maintain adequate hydration.  She was maintained on DAPT throughout his stay and will continue on 12 additional days of Plavix after discharge.   Follow-up CBC showed H&H to be relatively stable.  She continues to have mild thrombocytopenia without any signs of bleeding.  Her diabetes has been monitored with ac/hs CBG checks and SSI was use prn for tighter BS control.  Insulin glargine was resumed and has been titrated up for better control.  She was advised to monitor blood sugars on ACHS basis and follow-up with PCP for further titration and insulin.  She is tolerating regular diet without any signs or symptoms of aspiration.  She has been educated on standard precautions for safety with meals.  She has made steady gains during her rehab stay and is currently at supervision level. She will continue to receive follow-up home health PT and OT by [facility] home health after discharge.   Rehab course: During patient's stay in rehab weekly team conferences were held to monitor patient's progress, set goals and discuss barriers to discharge. At admission,  patient required supervision with ADL tasks and CGA for mobility. She exhibited mild dysarthria with occasional stuttering speech and displayed deficits in STM, problem solving and slow speech. She  has had improvement in activity tolerance, balance, postural control as well as ability to compensate for deficits. She has had improvement in functional use LUE  and LLE as well as improvement in awareness.  She is able to complete ADL tasks with supervision.  She is independent for transfers and is able to ambulate 200 feet with supervision and use of rolling walker. She requires supervision for  cognitive tasks as well as supervisory cues to utilize compensatory strategies to improve speech intelligibility.  Family education has been completed with sister.   Discharge disposition: 06-Home-Health Care Svc  Diet: heart healthy.   Special Instructions: No driving.   Discharge Instructions     Ambulatory referral to Neurology   Complete by: As directed    An appointment is requested in approximately: 4 weeks   Ambulatory referral to Physical Medicine Rehab   Complete by: As directed       Allergies as of 03/15/2023       Reactions   Penicillins Hives, Itching, Swelling   Tongue swelling        Medication List     STOP taking these medications    famotidine 20 MG tablet Commonly known as: PEPCID   fenofibrate 145 MG tablet Commonly known as: TRICOR   furosemide 20 MG tablet Commonly known as: LASIX   thiamine 100 MG tablet Commonly known as: Vitamin B-1 Replaced by: thiamine 100 MG tablet   tiZANidine 4 MG tablet Commonly known as: ZANAFLEX   triamcinolone cream 0.1 % Commonly known as: KENALOG       TAKE these medications    acetaminophen 325 MG tablet Commonly known as: TYLENOL Take 2 tablets (650 mg total) by mouth 2 (two) times daily. What changed:  when to take this reasons to take this   albuterol 108 (90 Base) MCG/ACT inhaler Commonly known as: VENTOLIN HFA Inhale 2 puffs into the lungs every 4 (four) hours as needed for wheezing.   amLODipine 10 MG tablet Commonly known as: NORVASC Take 1 tablet (10 mg total) by mouth daily.   aspirin EC 81 MG tablet Take 1 tablet (81 mg total) by mouth daily. Swallow whole.   clopidogrel 75 MG tablet Commonly known as: PLAVIX Take 1 tablet (75 mg total) by mouth daily. Notes to patient: For 12 more days--take daily till gone   cyanocobalamin 1000 MCG tablet Commonly known as: VITAMIN B12 Take 1 tablet (1,000 mcg total) by mouth daily for 30 days then as directed by MD   diclofenac Sodium  1 % Gel Commonly known as: VOLTAREN SMARTSIG:4 Gram(s) Topical 4 Times Daily PRN   Entresto 49-51 MG Generic drug: sacubitril-valsartan Take 1 tablet by mouth 2 (two) times daily.   gabapentin 100 MG capsule Commonly known as: NEURONTIN Take 1 capsule (100 mg total) by mouth 2 (two) times daily. Patient is taking as needed   HYDROcodone-acetaminophen 5-325 MG tablet Commonly known as: NORCO/VICODIN Take 1 tablet by mouth every 6 (six) hours as needed.   insulin glargine-yfgn 100 UNIT/ML injection Commonly known as: SEMGLEE Inject 0.3 mLs (30 Units total) into the skin daily. What changed: how much to take   naphazoline-glycerin 0.012-0.25 % Soln Commonly known as: CLEAR EYES REDNESS Place 1-2 drops into both eyes 3 (three) times daily with meals.   pantoprazole 40 MG tablet Commonly  known as: PROTONIX Take 40 mg by mouth daily.   Pen Needles 32G X 6 MM Misc 1 application by Does not apply route 2 (two) times daily.   Pirfenidone 801 MG Tabs TAKE 1 TABLET (801 MG) BY MOUTH WITH BREAKFAST, WITH LUNCH, AND WITH EVENING MEAL. What changed: See the new instructions.   rosuvastatin 40 MG tablet Commonly known as: CRESTOR Take 1 tablet (40 mg total) by mouth daily.   senna-docusate 8.6-50 MG tablet Commonly known as: Senokot-S Take 1 tablet by mouth at bedtime.   sertraline 50 MG tablet Commonly known as: ZOLOFT Take 1 tablet (50 mg total) by mouth daily.   Symbicort 160-4.5 MCG/ACT inhaler Generic drug: budesonide-formoterol INHALE 2 PUFFS INTO THE LUNGS TWICE DAILY   thiamine 100 MG tablet Commonly known as: VITAMIN B1 Take 1 tablet (100 mg total) by mouth daily. Replaces: thiamine 100 MG tablet   traZODone 100 MG tablet Commonly known as: DESYREL Take 1 tablet (100 mg total) by mouth at bedtime. What changed: when to take this   venlafaxine XR 150 MG 24 hr capsule Commonly known as: EFFEXOR-XR Take 1 capsule (150 mg total) by mouth daily.         Follow-up Information     Fleet Contras, MD Follow up.   Specialty: Internal Medicine Why: Call in 1-2 days for post hospital follow up Contact information: [address] [place]  [address] [phone]         Erick Colace, MD Follow up.   Specialty: Physical Medicine and Rehabilitation Why: office will call you with follow up appointment Contact information: [address] [place] [address] [phone]         [facility] Follow up.   Why: office will call you with follow up appointment Contact information: [address]     [address] [place] [address] [phone]                Signed: Jacquelynn Cree 03/16/2023, 6:00 PM

## 2023-03-15 DIAGNOSIS — I63311 Cerebral infarction due to thrombosis of right middle cerebral artery: Secondary | ICD-10-CM

## 2023-03-15 LAB — BASIC METABOLIC PANEL
Anion gap: 9 (ref 5–15)
BUN: 28 mg/dL — ABNORMAL HIGH (ref 8–23)
CO2: 18 mmol/L — ABNORMAL LOW (ref 22–32)
Calcium: 8.5 mg/dL — ABNORMAL LOW (ref 8.9–10.3)
Chloride: 112 mmol/L — ABNORMAL HIGH (ref 98–111)
Creatinine, Ser: 1.57 mg/dL — ABNORMAL HIGH (ref 0.44–1.00)
GFR, Estimated: 36 mL/min — ABNORMAL LOW (ref >60)
Glucose, Bld: 134 mg/dL — ABNORMAL HIGH (ref 70–99)
Potassium: 4 mmol/L (ref 3.5–5.1)
Sodium: 139 mmol/L (ref 135–145)

## 2023-03-15 LAB — GLUCOSE, CAPILLARY
Glucose-Capillary: 129 mg/dL — ABNORMAL HIGH (ref 70–99)
Glucose-Capillary: 171 mg/dL — ABNORMAL HIGH (ref 70–99)

## 2023-03-15 NOTE — Progress Notes (Addendum)
Patient ID: Erica [name], female   DOB: 1956-01-25, 67 y.o.   MRN: [id number]  [facility] referral submitted to [facility] Patient approved.

## 2023-03-15 NOTE — Progress Notes (Signed)
PROGRESS NOTE   Subjective/Complaints:  No choking episodes feels ok today   ROS: Patient denies CP, SOB, N/V/D  Objective:   No results found. Recent Labs    03/13/23 0656  WBC 3.6*  HGB 10.8*  HCT 30.7*  PLT 133*    Recent Labs    03/13/23 0656 03/15/23 0517  NA 140 139  K 4.3 4.0  CL 114* 112*  CO2 20* 18*  GLUCOSE 106* 134*  BUN 28* 28*  CREATININE 1.60* 1.57*  CALCIUM 8.3* 8.5*     Intake/Output Summary (Last 24 hours) at 03/15/2023 1021 Last data filed at 03/14/2023 1300 Gross per 24 hour  Intake 360 ml  Output --  Net 360 ml        Physical Exam: Vital Signs Blood pressure (!) 159/82, pulse 70, temperature 97.7 F (36.5 C), temperature source Oral, resp. rate 18, height 5\' 3"  (1.6 m), weight 73.7 kg, SpO2 99%.  EENT-  troat clear tongue midline   General: No acute distress Mood and affect are appropriate Heart: Regular rate and rhythm no rubs murmurs or extra sounds Lungs: Clear to auscultation, breathing unlabored, no rales or wheezes Abdomen: Positive bowel sounds, soft nontender to palpation, nondistended Extremities: No clubbing, cyanosis, or edema  Musculoskeletal: Full range of motion in all 4 extremities. No joint swelling  Skin: No evidence of breakdown, no evidence of rash Neurologic: Cranial nerves II through XII intact, motor strength is 5/5 in right and 4+ Left deltoid, bicep, tricep, grip, hip flexor, knee extensors, ankle dorsiflexor and plantar flexor Sensory exam normal sensation to light touch  in bilateral upper and lower extremities Cerebellar exam normal finger to nose to finger as well as heel to shin in bilateral upper and lower extremities slower on the left side. Nice clearance of LLE during swing phase of gait using foot up brace. Musculoskeletal: Full range of motion in all 4 extremities. No joint swelling   Assessment/Plan: 1. Functional deficits due to  recurrent CVA Stable for D/C today F/u PCP in 3-4 weeks F/u PM&R 2 weeks F/u neuro 1-2 mo See D/C summary See D/C instructions  Care Tool:  Bathing    Body parts bathed by patient: Right arm, Left arm, Chest, Abdomen, Front perineal area, Buttocks, Left upper leg, Right upper leg, Right lower leg, Left lower leg, Face         Bathing assist Assist Level: Independent with assistive device     Upper Body Dressing/Undressing Upper body dressing   What is the patient wearing?: Bra, Pull over shirt    Upper body assist Assist Level: Independent    Lower Body Dressing/Undressing Lower body dressing      What is the patient wearing?: Underwear/pull up, Pants     Lower body assist Assist for lower body dressing: Independent with assitive device     Toileting Toileting    Toileting assist Assist for toileting: Independent with assistive device     Transfers Chair/bed transfer  Transfers assist     Chair/bed transfer assist level: Independent with assistive device     Locomotion Ambulation   Ambulation assist      Assist level: Contact Guard/Touching assist  Assistive device: Walker-rolling Max distance: 98ft   Walk 10 feet activity   Assist     Assist level: Contact Guard/Touching assist Assistive device: Walker-rolling   Walk 50 feet activity   Assist Walk 50 feet with 2 turns activity did not occur: Safety/medical concerns (orthostatic)         Walk 150 feet activity   Assist Walk 150 feet activity did not occur: Safety/medical concerns         Walk 10 feet on uneven surface  activity   Assist Walk 10 feet on uneven surfaces activity did not occur: Safety/medical concerns         Wheelchair     Assist Is the patient using a wheelchair?: Yes Type of Wheelchair: Manual Wheelchair activity did not occur: Safety/medical concerns         Wheelchair 50 feet with 2 turns activity    Assist    Wheelchair 50 feet  with 2 turns activity did not occur: Safety/medical concerns       Wheelchair 150 feet activity     Assist  Wheelchair 150 feet activity did not occur: Safety/medical concerns       Blood pressure (!) 159/82, pulse 70, temperature 97.7 F (36.5 C), temperature source Oral, resp. rate 18, height 5\' 3"  (1.6 m), weight 73.7 kg, SpO2 99%.  Medical Problem List and Plan: 1. Functional deficits secondary to acute infarcts of bilateral centrum semiovale. Mild L HP slightly worsened from prior stroke              -patient may shower             -D/C home today              -Continue CIR therapies including PT, OT   -seems to be utilizing off the shelf foot up brace nicely LLE 2.  Antithrombotics: -DVT/anticoagulation:  Pharmaceutical: Lovenox             -antiplatelet therapy: DAPT X 3 weeks followed by ASA alont.  3. Peripheral neuropathy/right sided headache:    9/30 -will schedule tylenol 650mg  @ 0600 and 1200 daily prior to activity 4. Mood/Behavior/Sleep: LCSW to follow for evaluation and support             --melatonin prn for insomnia.              -antipsychotic agents: N/A 5. Neuropsych/cognition: This patient is not fully capable of making decisions on her own behalf. 6. Skin/Wound Care: Routine pressure relief measures.  7. Fluids/Electrolytes/Nutrition: Monitor I/O. Check CMET in am. 8. HTN: Monitor BP TID. Continue amlodipine 10 mg daily and Entresto Vitals:   03/14/23 1947 03/15/23 0257  BP: 135/81 (!) 159/82  Pulse: 80 70  Resp: 18 18  Temp: 97.7 F (36.5 C) 97.7 F (36.5 C)  SpO2: 100% 99%  10/2 elevated this am but generally controlled 9. T2DM: Hgb A1C-6.9.  Was on lantus 24 u am/18 u pm             - recommended to avoid drinks with added sugar.  CBG (last 3)  Recent Labs    03/14/23 1620 03/14/23 2053 03/15/23 0620  GLUCAP 127* 221* 129*  Controlled 10/2 10 H/o COPD/IPF: Per records has chronic DOE/cough.             --on Pirfenidone TID. No  MDI-->followed by Dr. Marchelle Gearing 11. GERD: On protonix  12. H/o Depression/anxiety/SA: Continue Effexor and Zoloft. Check magnesium level tomorrow.  13. Screening for vitamin D deficiency: check vitamin D level tomorrow   14.  CKD 3b- low GFR for >5yr, monitor for superimposed azotemia , no nephro toxic meds identified     Latest Ref Rng & Units 03/15/2023    5:17 AM 03/13/2023    6:56 AM 03/09/2023    6:53 AM  BMP  Glucose 70 - 99 mg/dL 161  096  045   BUN 8 - 23 mg/dL 28  28  19    Creatinine 0.44 - 1.00 mg/dL 4.09  8.11  9.14   Sodium 135 - 145 mmol/L 139  140  140   Potassium 3.5 - 5.1 mmol/L 4.0  4.3  4.1   Chloride 98 - 111 mmol/L 112  114  113   CO2 22 - 32 mmol/L 18  20  22    Calcium 8.9 - 10.3 mg/dL 8.5  8.3  8.4    7/82 increased BUN today--encourage fluids   -f/u later in week  15.  sporadic aspiration event,no recurrent episodes, appreciate SLP eval  LOS: 7 days A FACE TO FACE EVALUATION WAS PERFORMED  Erick Colace 03/15/2023, 10:21 AM

## 2023-03-15 NOTE — Progress Notes (Signed)
INPATIENT REHABILITATION DISCHARGE NOTE   Discharge instructions by: Elita Quick PA  Verbalized understanding: yes  Skin care/Wound care healing? none  Pain: medicated   IV's: none  Tubes/Drains: none  O2: none  Safety instructions: none  Patient belongings: sent with pt  Discharged to: home  Discharged via: family transport  Notes: done   Marylu Lund, Charity fundraiser

## 2023-03-15 NOTE — Plan of Care (Signed)
  Problem: RH Balance Goal: LTG Patient will maintain dynamic standing balance (PT) Description: LTG:  Patient will maintain dynamic standing balance with assistance during mobility activities (PT) 03/15/2023 0550 by Loel Dubonnet, PT Outcome: Completed/Met 03/15/2023 0548 by Loel Dubonnet, PT Flowsheets (Taken 03/09/2023 1344 by Wynelle Link P, PT) LTG: Pt will maintain dynamic standing balance during mobility activities with:: Supervision/Verbal cueing   Problem: Sit to Stand Goal: LTG:  Patient will perform sit to stand with assistance level (PT) Description: LTG:  Patient will perform sit to stand with assistance level (PT) 03/15/2023 0550 by Loel Dubonnet, PT Outcome: Completed/Met 03/15/2023 0548 by Loel Dubonnet, PT Flowsheets (Taken 03/09/2023 1344 by Wynelle Link P, PT) LTG: PT will perform sit to stand in preparation for functional mobility with assistance level: Independent with assistive device   Problem: RH Bed Mobility Goal: LTG Patient will perform bed mobility with assist (PT) Description: LTG: Patient will perform bed mobility with assistance, with/without cues (PT). 03/15/2023 0550 by Loel Dubonnet, PT Outcome: Completed/Met 03/15/2023 0548 by Loel Dubonnet, PT Flowsheets (Taken 03/09/2023 1344 by Wynelle Link P, PT) LTG: Pt will perform bed mobility with assistance level of: Independent with assistive device    Problem: RH Bed to Chair Transfers Goal: LTG Patient will perform bed/chair transfers w/assist (PT) Description: LTG: Patient will perform bed to chair transfers with assistance (PT). 03/15/2023 0550 by Loel Dubonnet, PT Outcome: Completed/Met 03/15/2023 0548 by Loel Dubonnet, PT Flowsheets (Taken 03/09/2023 1344 by Wynelle Link P, PT) LTG: Pt will perform Bed to Chair Transfers with assistance level: Independent with assistive device    Problem: RH Car Transfers Goal: LTG Patient will perform car transfers with assist  (PT) Description: LTG: Patient will perform car transfers with assistance (PT). 03/15/2023 0550 by Loel Dubonnet, PT Outcome: Completed/Met 03/15/2023 0548 by Loel Dubonnet, PT Flowsheets (Taken 03/09/2023 1344 by Wynelle Link P, PT) LTG: Pt will perform car transfers with assist:: Supervision/Verbal cueing   Problem: RH Ambulation Goal: LTG Patient will ambulate in controlled environment (PT) Description: LTG: Patient will ambulate in a controlled environment, # of feet with assistance (PT). 03/15/2023 0550 by Loel Dubonnet, PT Outcome: Completed/Met 03/15/2023 0548 by Loel Dubonnet, PT Flowsheets (Taken 03/09/2023 1344 by Wynelle Link P, PT) LTG: Pt will ambulate in controlled environ  assist needed:: Supervision/Verbal cueing LTG: Ambulation distance in controlled environment: 177ft Goal: LTG Patient will ambulate in home environment (PT) Description: LTG: Patient will ambulate in home environment, # of feet with assistance (PT). 03/15/2023 0550 by Loel Dubonnet, PT Outcome: Completed/Met 03/15/2023 0548 by Loel Dubonnet, PT Flowsheets (Taken 03/09/2023 1344 by Wynelle Link P, PT) LTG: Pt will ambulate in home environ  assist needed:: Supervision/Verbal cueing LTG: Ambulation distance in home environment: 31ft   Problem: RH Stairs Goal: LTG Patient will ambulate up and down stairs w/assist (PT) Description: LTG: Patient will ambulate up and down # of stairs with assistance (PT) 03/15/2023 0550 by Loel Dubonnet, PT Outcome: Completed/Met 03/15/2023 0548 by Loel Dubonnet, PT Flowsheets (Taken 03/09/2023 1344 by Wynelle Link P, PT) LTG: Pt will ambulate up/down stairs assist needed:: Contact Guard/Touching assist LTG: Pt will  ambulate up and down number of stairs: at least 4 with 2 hand rails

## 2023-03-15 NOTE — Progress Notes (Signed)
Slept well last night. No new changes to report. Safety maintained at all times.

## 2023-03-15 NOTE — Progress Notes (Signed)
Inpatient Rehabilitation Care Coordinator Discharge Note   Patient Details  Name: Erica [name] MRN: [id number] Date of Birth: [birth year]   Discharge location: Home  Length of Stay: 7 Days  Discharge activity level: Supervision  Home/community participation: Sister  Patient response WU:[id number] Literacy - How often do you need to have someone help you when you read instructions, pamphlets, or other written material from your doctor or pharmacy?: Sometimes  Patient response YN:[id number] Isolation - How often do you feel lonely or isolated from those around you?: Never  Services provided included: MD, RD, PT, OT, SLP, RN, CM, TR, Pharmacy, Neuropsych, SW  Financial Services:  Field seismologist Utilized: Private Insurance [employer]  Choices offered to/list presented to: Sister and Patient  Follow-up services arranged:  Home Health, DME Home Health Agency: [facility]    DME : RW, BSC and TTB    Patient response to transportation need: Is the patient able to respond to transportation needs?: Yes In the past 12 months, has lack of transportation kept you from medical appointments or from getting medications?: No In the past 12 months, has lack of transportation kept you from meetings, work, or from getting things needed for daily living?: No   Patient/Family verbalized understanding of follow-up arrangements:  Yes  Individual responsible for coordination of the follow-up plan: [name], sister  Confirmed correct DME delivered: Andria Rhein 03/15/2023    Comments (or additional information):  Summary of Stay    Date/Time Discharge Planning CSW  03/14/23 1453 Patient discharging home tomorrow CJB       Andria Rhein

## 2023-03-15 NOTE — Progress Notes (Signed)
Inpatient Rehabilitation Discharge Medication Review by a Pharmacist  A complete drug regimen review was completed for this patient to identify any potential clinically significant medication issues.  High Risk Drug Classes Is patient taking? Indication by Medication  Antipsychotic No   Anticoagulant No   Antibiotic No   Opioid Yes Vicodin - pain  Antiplatelet Yes Aspirin, Plavix - stroke  Hypoglycemics/insulin Yes Semglee - DM  Vasoactive Medication Yes Entresto - CHF Amlodipine- HTN  Chemotherapy No   Other Yes Thiamine, B12 - supplementation Pirfenidone - pulm fibrosis Trazadone - sleep Albuterol, symbicort - COPD Voltaren gel - pain Gabapentin - pain PPI - GERD Rousavastatin - HLD Senna - constipation Zoloft, venlafaxine - MDD     Type of Medication Issue Identified Description of Issue Recommendation(s)  Drug Interaction(s) (clinically significant)     Duplicate Therapy     Allergy     No Medication Administration End Date     Incorrect Dose     Additional Drug Therapy Needed     Significant med changes from prior encounter (inform family/care partners about these prior to discharge).    Other       Clinically significant medication issues were identified that warrant physician communication and completion of prescribed/recommended actions by midnight of the next day:  No  Name of provider notified for urgent issues identified:   Provider Method of Notification:     Pharmacist comments:   Time spent performing this drug regimen review (minutes):  15   Jeanella Cara, PharmD, [place] Clinical Pharmacist Please see AMION for all Pharmacists' Contact Phone Numbers 03/15/2023, 12:55 PM

## 2023-03-16 ENCOUNTER — Telehealth: Payer: Self-pay

## 2023-03-16 NOTE — Telephone Encounter (Addendum)
Transitional Care Call--who you spoke with  Darel Hong A. Goodner & [name] (Sister)    Are you/is patient experiencing any problems since coming home?None.   Are there any questions regarding any aspect of care? None. Are there any questions regarding medications administration/dosing? None.  Are meds being taken as prescribed? Yes.  Patient should review meds with caller to confirm. Medications reviewed.  Have there been any falls? No. Has Home Health been to the house and/or have they contacted you? No.  If not, have you tried to contact them? No. Can we help you contact them? Phone number provided for the family to call. They do not answer calls from non-identified callers.  Are bowels and bladder emptying properly? Yes. Are there any unexpected incontinence issues?No.  If applicable, is patient following bowel/bladder programs? N/A. Any fevers, problems with breathing, unexpected pain? None. Are there any skin problems or new areas of breakdown? None.  Has the patient/family member arranged specialty MD follow up (ie cardiology/neurology/renal/surgical/etc)? Sister given discharge phone numbers.  Can we help arrange? No.  Does the patient need any other services or support that we can help arrange? No. Advised to call back if    help is needed.  Are caregivers following through as expected in assisting the patient? Yes.         11. Has the patient quit smoking, drinking alcohol, or using drugs as recommended? Per [name] she does not do those activities.   Appointment  Erica [name] on 03/27/2023 at 1:00 PM, [address] [employer] [address]

## 2023-03-18 ENCOUNTER — Emergency Department: Payer: Medicare HMO

## 2023-03-18 ENCOUNTER — Inpatient Hospital Stay: Payer: Medicare HMO

## 2023-03-18 ENCOUNTER — Encounter: Payer: Self-pay | Admitting: Internal Medicine

## 2023-03-18 ENCOUNTER — Inpatient Hospital Stay
Admission: EM | Admit: 2023-03-18 | Discharge: 2023-03-21 | DRG: 065 | Disposition: A | Discharge status: Home Health | Payer: Medicare HMO | Attending: Internal Medicine | Admitting: Internal Medicine

## 2023-03-18 DIAGNOSIS — Z7951 Long term (current) use of inhaled steroids: Secondary | ICD-10-CM

## 2023-03-18 DIAGNOSIS — I129 Hypertensive chronic kidney disease with stage 1 through stage 4 chronic kidney disease, or unspecified chronic kidney disease: Secondary | ICD-10-CM | POA: Diagnosis present

## 2023-03-18 DIAGNOSIS — I951 Orthostatic hypotension: Secondary | ICD-10-CM | POA: Diagnosis present

## 2023-03-18 DIAGNOSIS — G8191 Hemiplegia, unspecified affecting right dominant side: Secondary | ICD-10-CM | POA: Diagnosis present

## 2023-03-18 DIAGNOSIS — E1142 Type 2 diabetes mellitus with diabetic polyneuropathy: Secondary | ICD-10-CM | POA: Diagnosis present

## 2023-03-18 DIAGNOSIS — Z794 Long term (current) use of insulin: Secondary | ICD-10-CM

## 2023-03-18 DIAGNOSIS — E1165 Type 2 diabetes mellitus with hyperglycemia: Secondary | ICD-10-CM | POA: Diagnosis present

## 2023-03-18 DIAGNOSIS — Z801 Family history of malignant neoplasm of trachea, bronchus and lung: Secondary | ICD-10-CM

## 2023-03-18 DIAGNOSIS — Z833 Family history of diabetes mellitus: Secondary | ICD-10-CM

## 2023-03-18 DIAGNOSIS — Z1152 Encounter for screening for COVID-19: Secondary | ICD-10-CM

## 2023-03-18 DIAGNOSIS — J849 Interstitial pulmonary disease, unspecified: Secondary | ICD-10-CM | POA: Diagnosis not present

## 2023-03-18 DIAGNOSIS — Z7902 Long term (current) use of antithrombotics/antiplatelets: Secondary | ICD-10-CM

## 2023-03-18 DIAGNOSIS — I6310 Cerebral infarction due to embolism of unspecified precerebral artery: Secondary | ICD-10-CM | POA: Diagnosis not present

## 2023-03-18 DIAGNOSIS — F1411 Cocaine abuse, in remission: Secondary | ICD-10-CM | POA: Diagnosis present

## 2023-03-18 DIAGNOSIS — I69354 Hemiplegia and hemiparesis following cerebral infarction affecting left non-dominant side: Secondary | ICD-10-CM | POA: Diagnosis not present

## 2023-03-18 DIAGNOSIS — I639 Cerebral infarction, unspecified: Secondary | ICD-10-CM | POA: Diagnosis present

## 2023-03-18 DIAGNOSIS — Z66 Do not resuscitate: Secondary | ICD-10-CM | POA: Diagnosis present

## 2023-03-18 DIAGNOSIS — Z9151 Personal history of suicidal behavior: Secondary | ICD-10-CM

## 2023-03-18 DIAGNOSIS — F32A Depression, unspecified: Secondary | ICD-10-CM | POA: Diagnosis present

## 2023-03-18 DIAGNOSIS — I10 Essential (primary) hypertension: Secondary | ICD-10-CM | POA: Diagnosis present

## 2023-03-18 DIAGNOSIS — Z87891 Personal history of nicotine dependence: Secondary | ICD-10-CM | POA: Diagnosis not present

## 2023-03-18 DIAGNOSIS — R053 Chronic cough: Secondary | ICD-10-CM | POA: Diagnosis present

## 2023-03-18 DIAGNOSIS — Z91410 Personal history of adult physical and sexual abuse: Secondary | ICD-10-CM

## 2023-03-18 DIAGNOSIS — Z800 Family history of malignant neoplasm of digestive organs: Secondary | ICD-10-CM | POA: Diagnosis not present

## 2023-03-18 DIAGNOSIS — K219 Gastro-esophageal reflux disease without esophagitis: Secondary | ICD-10-CM | POA: Diagnosis present

## 2023-03-18 DIAGNOSIS — E1122 Type 2 diabetes mellitus with diabetic chronic kidney disease: Secondary | ICD-10-CM | POA: Diagnosis present

## 2023-03-18 DIAGNOSIS — J4489 Other specified chronic obstructive pulmonary disease: Secondary | ICD-10-CM | POA: Diagnosis present

## 2023-03-18 DIAGNOSIS — Z85048 Personal history of other malignant neoplasm of rectum, rectosigmoid junction, and anus: Secondary | ICD-10-CM

## 2023-03-18 DIAGNOSIS — Z7982 Long term (current) use of aspirin: Secondary | ICD-10-CM

## 2023-03-18 DIAGNOSIS — Z83438 Family history of other disorder of lipoprotein metabolism and other lipidemia: Secondary | ICD-10-CM

## 2023-03-18 DIAGNOSIS — K621 Rectal polyp: Secondary | ICD-10-CM | POA: Diagnosis present

## 2023-03-18 DIAGNOSIS — Z880 Allergy status to penicillin: Secondary | ICD-10-CM

## 2023-03-18 DIAGNOSIS — Z8249 Family history of ischemic heart disease and other diseases of the circulatory system: Secondary | ICD-10-CM

## 2023-03-18 DIAGNOSIS — R4182 Altered mental status, unspecified: Secondary | ICD-10-CM

## 2023-03-18 DIAGNOSIS — J8410 Pulmonary fibrosis, unspecified: Secondary | ICD-10-CM | POA: Diagnosis present

## 2023-03-18 DIAGNOSIS — R55 Syncope and collapse: Secondary | ICD-10-CM | POA: Diagnosis present

## 2023-03-18 DIAGNOSIS — R569 Unspecified convulsions: Secondary | ICD-10-CM | POA: Diagnosis not present

## 2023-03-18 DIAGNOSIS — Z823 Family history of stroke: Secondary | ICD-10-CM

## 2023-03-18 DIAGNOSIS — M1990 Unspecified osteoarthritis, unspecified site: Secondary | ICD-10-CM | POA: Diagnosis present

## 2023-03-18 DIAGNOSIS — N1832 Chronic kidney disease, stage 3b: Secondary | ICD-10-CM | POA: Diagnosis present

## 2023-03-18 DIAGNOSIS — I6349 Cerebral infarction due to embolism of other cerebral artery: Secondary | ICD-10-CM | POA: Diagnosis present

## 2023-03-18 DIAGNOSIS — H5789 Other specified disorders of eye and adnexa: Secondary | ICD-10-CM | POA: Diagnosis present

## 2023-03-18 DIAGNOSIS — Z79899 Other long term (current) drug therapy: Secondary | ICD-10-CM

## 2023-03-18 LAB — URINE DRUG SCREEN, QUALITATIVE (ARMC ONLY)
Amphetamines, Ur Screen: NOT DETECTED
Barbiturates, Ur Screen: NOT DETECTED
Benzodiazepine, Ur Scrn: NOT DETECTED
Cannabinoid 50 Ng, Ur ~~LOC~~: NOT DETECTED
Cocaine Metabolite,Ur ~~LOC~~: NOT DETECTED
MDMA (Ecstasy)Ur Screen: NOT DETECTED
Methadone Scn, Ur: NOT DETECTED
Opiate, Ur Screen: NOT DETECTED
Phencyclidine (PCP) Ur S: NOT DETECTED
Tricyclic, Ur Screen: NOT DETECTED

## 2023-03-18 LAB — CBC
HCT: 29.8 % — ABNORMAL LOW (ref 36.0–46.0)
Hemoglobin: 10.6 g/dL — ABNORMAL LOW (ref 12.0–15.0)
MCH: 31.9 pg (ref 26.0–34.0)
MCHC: 35.6 g/dL (ref 30.0–36.0)
MCV: 89.8 fL (ref 80.0–100.0)
Platelets: 133 10*3/uL — ABNORMAL LOW (ref 150–400)
RBC: 3.32 MIL/uL — ABNORMAL LOW (ref 3.87–5.11)
RDW: 16.4 % — ABNORMAL HIGH (ref 11.5–15.5)
WBC: 4.4 10*3/uL (ref 4.0–10.5)
nRBC: 0 % (ref 0.0–0.2)

## 2023-03-18 LAB — GLUCOSE, CAPILLARY: Glucose-Capillary: 175 mg/dL — ABNORMAL HIGH (ref 70–99)

## 2023-03-18 LAB — URINALYSIS, ROUTINE W REFLEX MICROSCOPIC
Bacteria, UA: NONE SEEN
Bilirubin Urine: NEGATIVE
Glucose, UA: NEGATIVE mg/dL
Hgb urine dipstick: NEGATIVE
Ketones, ur: NEGATIVE mg/dL
Leukocytes,Ua: NEGATIVE
Nitrite: NEGATIVE
Protein, ur: 100 mg/dL — AB
RBC / HPF: 0 RBC/hpf (ref 0–5)
Specific Gravity, Urine: 1.021 (ref 1.005–1.030)
pH: 5 (ref 5.0–8.0)

## 2023-03-18 LAB — COMPREHENSIVE METABOLIC PANEL
ALT: 10 U/L (ref 0–44)
AST: 19 U/L (ref 15–41)
Albumin: 3 g/dL — ABNORMAL LOW (ref 3.5–5.0)
Alkaline Phosphatase: 110 U/L (ref 38–126)
Anion gap: 8 (ref 5–15)
BUN: 31 mg/dL — ABNORMAL HIGH (ref 8–23)
CO2: 21 mmol/L — ABNORMAL LOW (ref 22–32)
Calcium: 8.1 mg/dL — ABNORMAL LOW (ref 8.9–10.3)
Chloride: 112 mmol/L — ABNORMAL HIGH (ref 98–111)
Creatinine, Ser: 1.52 mg/dL — ABNORMAL HIGH (ref 0.44–1.00)
GFR, Estimated: 37 mL/min — ABNORMAL LOW (ref >60)
Glucose, Bld: 149 mg/dL — ABNORMAL HIGH (ref 70–99)
Potassium: 4.2 mmol/L (ref 3.5–5.1)
Sodium: 141 mmol/L (ref 135–145)
Total Bilirubin: 0.8 mg/dL (ref 0.3–1.2)
Total Protein: 6.1 g/dL — ABNORMAL LOW (ref 6.5–8.1)

## 2023-03-18 LAB — DIFFERENTIAL
Abs Immature Granulocytes: 0.01 10*3/uL (ref 0.00–0.07)
Basophils Absolute: 0 10*3/uL (ref 0.0–0.1)
Basophils Relative: 0 %
Eosinophils Absolute: 0.1 10*3/uL (ref 0.0–0.5)
Eosinophils Relative: 3 %
Immature Granulocytes: 0 %
Lymphocytes Relative: 20 %
Lymphs Abs: 0.9 10*3/uL (ref 0.7–4.0)
Monocytes Absolute: 0.2 10*3/uL (ref 0.1–1.0)
Monocytes Relative: 5 %
Neutro Abs: 3.2 10*3/uL (ref 1.7–7.7)
Neutrophils Relative %: 72 %

## 2023-03-18 LAB — PROTIME-INR
INR: 1.2 (ref 0.8–1.2)
Prothrombin Time: 15.5 s — ABNORMAL HIGH (ref 11.4–15.2)

## 2023-03-18 LAB — AMMONIA: Ammonia: 11 umol/L (ref 9–35)

## 2023-03-18 LAB — APTT: aPTT: 29 s (ref 24–36)

## 2023-03-18 LAB — ETHANOL: Alcohol, Ethyl (B): <10 mg/dL (ref <10)

## 2023-03-18 MED ORDER — HYDRALAZINE HCL 20 MG/ML IJ SOLN: 5.0000 mg | Freq: Four times a day (QID) | INTRAMUSCULAR | Status: DC | PRN

## 2023-03-18 MED ORDER — SODIUM CHLORIDE 0.9% FLUSH
3.0000 mL | Freq: Once | INTRAVENOUS | Status: AC
  Administered 2023-03-18: 3 mL via INTRAVENOUS

## 2023-03-18 MED ORDER — INSULIN GLARGINE-YFGN 100 UNIT/ML ~~LOC~~ SOLN: 30.0000 [IU] | Freq: Every day | SUBCUTANEOUS | Status: DC

## 2023-03-18 MED ORDER — SENNOSIDES-DOCUSATE SODIUM 8.6-50 MG PO TABS: 1.0000 | ORAL_TABLET | Freq: Every evening | ORAL | Status: DC | PRN

## 2023-03-18 MED ORDER — ONDANSETRON HCL 4 MG PO TABS: 4.0000 mg | ORAL_TABLET | Freq: Four times a day (QID) | ORAL | Status: DC | PRN

## 2023-03-18 MED ORDER — PANTOPRAZOLE SODIUM 40 MG PO TBEC
40.0000 mg | DELAYED_RELEASE_TABLET | Freq: Every day | ORAL | Status: DC
  Administered 2023-03-19 – 2023-03-21 (×3): 40 mg via ORAL
  Filled 2023-03-18 (×3): qty 1

## 2023-03-18 MED ORDER — INSULIN ASPART 100 UNIT/ML IJ SOLN
0.0000 [IU] | Freq: Three times a day (TID) | INTRAMUSCULAR | Status: DC
  Administered 2023-03-19 (×2): 2 [IU] via SUBCUTANEOUS
  Administered 2023-03-20: 1 [IU] via SUBCUTANEOUS
  Administered 2023-03-20: 5 [IU] via SUBCUTANEOUS
  Administered 2023-03-21: 1 [IU] via SUBCUTANEOUS
  Administered 2023-03-21: 2 [IU] via SUBCUTANEOUS
  Filled 2023-03-18 (×6): qty 1

## 2023-03-18 MED ORDER — THIAMINE HCL 100 MG PO TABS
100.0000 mg | ORAL_TABLET | Freq: Every day | ORAL | Status: DC
  Administered 2023-03-19 – 2023-03-21 (×3): 100 mg via ORAL
  Filled 2023-03-18 (×6): qty 1

## 2023-03-18 MED ORDER — ASPIRIN 81 MG PO TBEC
81.0000 mg | DELAYED_RELEASE_TABLET | Freq: Every day | ORAL | Status: DC
  Administered 2023-03-19 – 2023-03-21 (×3): 81 mg via ORAL
  Filled 2023-03-18 (×3): qty 1

## 2023-03-18 MED ORDER — INSULIN ASPART 100 UNIT/ML IJ SOLN
0.0000 [IU] | Freq: Every day | INTRAMUSCULAR | Status: DC
  Administered 2023-03-19 – 2023-03-20 (×2): 2 [IU] via SUBCUTANEOUS
  Filled 2023-03-18 (×2): qty 1

## 2023-03-18 MED ORDER — TRAZODONE HCL 100 MG PO TABS
100.0000 mg | ORAL_TABLET | Freq: Every day | ORAL | Status: DC
  Administered 2023-03-19 – 2023-03-20 (×2): 100 mg via ORAL
  Filled 2023-03-18 (×2): qty 1

## 2023-03-18 MED ORDER — CLOPIDOGREL BISULFATE 75 MG PO TABS
75.0000 mg | ORAL_TABLET | Freq: Every day | ORAL | Status: DC
  Administered 2023-03-19 – 2023-03-21 (×3): 75 mg via ORAL
  Filled 2023-03-18 (×3): qty 1

## 2023-03-18 MED ORDER — NAPHAZOLINE-GLYCERIN 0.012-0.25 % OP SOLN
1.0000 [drp] | Freq: Three times a day (TID) | OPHTHALMIC | Status: DC
  Administered 2023-03-19 – 2023-03-20 (×3): 2 [drp] via OPHTHALMIC
  Administered 2023-03-20 – 2023-03-21 (×4): 1 [drp] via OPHTHALMIC
  Filled 2023-03-18: qty 15

## 2023-03-18 MED ORDER — HEPARIN SODIUM (PORCINE) 5000 UNIT/ML IJ SOLN
5000.0000 [IU] | Freq: Three times a day (TID) | INTRAMUSCULAR | Status: DC
  Administered 2023-03-18 – 2023-03-21 (×7): 5000 [IU] via SUBCUTANEOUS
  Filled 2023-03-18 (×7): qty 1

## 2023-03-18 MED ORDER — ONDANSETRON HCL 4 MG/2ML IJ SOLN: 4.0000 mg | Freq: Four times a day (QID) | INTRAMUSCULAR | Status: DC | PRN

## 2023-03-18 MED ORDER — CYANOCOBALAMIN 500 MCG PO TABS
1000.0000 ug | ORAL_TABLET | Freq: Every day | ORAL | Status: DC
  Administered 2023-03-19 – 2023-03-21 (×3): 1000 ug via ORAL
  Filled 2023-03-18 (×3): qty 2

## 2023-03-18 MED ORDER — ACETAMINOPHEN 325 MG PO TABS: 650.0000 mg | ORAL_TABLET | Freq: Four times a day (QID) | ORAL | Status: DC | PRN

## 2023-03-18 MED ORDER — VENLAFAXINE HCL ER 150 MG PO CP24
150.0000 mg | ORAL_CAPSULE | Freq: Every day | ORAL | Status: DC
  Administered 2023-03-19 – 2023-03-21 (×3): 150 mg via ORAL
  Filled 2023-03-18 (×2): qty 2
  Filled 2023-03-18: qty 1
  Filled 2023-03-18: qty 2
  Filled 2023-03-18 (×2): qty 1

## 2023-03-18 MED ORDER — SENNOSIDES-DOCUSATE SODIUM 8.6-50 MG PO TABS
1.0000 | ORAL_TABLET | Freq: Every day | ORAL | Status: DC
  Administered 2023-03-19: 1 via ORAL
  Filled 2023-03-18 (×2): qty 1

## 2023-03-18 MED ORDER — SERTRALINE HCL 50 MG PO TABS
50.0000 mg | ORAL_TABLET | Freq: Every day | ORAL | Status: DC
  Administered 2023-03-19 – 2023-03-21 (×3): 50 mg via ORAL
  Filled 2023-03-18 (×3): qty 1

## 2023-03-18 MED ORDER — ALBUTEROL SULFATE HFA 108 (90 BASE) MCG/ACT IN AERS: 2.0000 | INHALATION_SPRAY | RESPIRATORY_TRACT | Status: DC | PRN

## 2023-03-18 MED ORDER — SODIUM CHLORIDE 0.9% FLUSH
3.0000 mL | Freq: Two times a day (BID) | INTRAVENOUS | Status: DC
  Administered 2023-03-18 – 2023-03-21 (×5): 3 mL via INTRAVENOUS

## 2023-03-18 MED ORDER — ACETAMINOPHEN 650 MG RE SUPP: 650.0000 mg | Freq: Four times a day (QID) | RECTAL | Status: DC | PRN

## 2023-03-18 MED ORDER — ROSUVASTATIN CALCIUM 20 MG PO TABS
40.0000 mg | ORAL_TABLET | Freq: Every day | ORAL | Status: DC
  Administered 2023-03-19 – 2023-03-20 (×2): 40 mg via ORAL
  Filled 2023-03-18 (×2): qty 4
  Filled 2023-03-18 (×2): qty 2

## 2023-03-18 MED ORDER — IOHEXOL 350 MG/ML SOLN
60.0000 mL | Freq: Once | INTRAVENOUS | Status: AC | PRN
  Administered 2023-03-18: 60 mL via INTRAVENOUS

## 2023-03-18 NOTE — Assessment & Plan Note (Signed)
At baseline 
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
## 2023-03-18 NOTE — ED Notes (Signed)
Pt ambulated to in room toilet with assistance from staff. Pt uses a cane at baseline. Pt gait was slow, steady, and even. Pt returned to bed with assistance from staff and was hooked back up to the monitor. Pt is clean and dry at this time.

## 2023-03-18 NOTE — Progress Notes (Signed)
Telestroke Note   1343: Code stroke cart activated at this time for patient who arrived via EMS. Patient presents with a LKW time of 1230. mRS 2. Per nursing, patient was found outside on a bench with concerns for lethargy and left arm drift.   1344: Dr. Amada Jupiter paged.   1346: Dr. Amada Jupiter in CT performing neuro evaluation.   1350: IV access obtained by nursing. Proceeding with advanced imaging per Dr. Amada Jupiter.   1356: Patient leaving CT. CT imaging results provided to this RN by Dr. Amada Jupiter. Telestroke team dismissed at this time by Dr. Amada Jupiter.   1357: Logged off of telestroke cart per Dr. Amada Jupiter request.    Derrill Kay Telestroke RN

## 2023-03-18 NOTE — Consult Note (Signed)
NEUROLOGY CONSULT NOTE   Date of service: March 18, 2023 Patient Name: Erica [name] MRN:  [id number] DOB:  [birth year] Chief Complaint: "Right sided weakness" Requesting Provider: No att. providers found  History of Present Illness  TAQUILA [name] is a 67 y.o. 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 who presents with right sided weakness that started this afternoon. She was seen walking with a cane around 12:30pm and then was subsequently found slumped over.  She has been very sleepy ever since she was admitted during her previous hospitalization.  She also frequently appears "out of it."    When her sister tried to arouse her, she had difficulty getting her to wake up but with repeated stimulation she was able to get her to arouse.  It was then noted that she had some right-sided weakness as well.   LKW: 12:30pm Modified rankin score: 3-Moderate disability-requires help but walks WITHOUT assistance IV Thrombolysis: No, recent stroke EVT: No, no LVO   Past History   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   GERD (gastroesophageal reflux disease)    with chronic cough   Headache(784.0)    "mild"   Hypertension    Loop Biotronik loop implant 12/16/2020 12/16/2020   Mental disorder    depression   Neuropathy    feet    Pain    arthritis pain - takes tramadol as needed   Pulmonary fibrosis, unspecified  (HCC)    Rectal cancer (HCC)    Rectal polyp    very little bleeding with bowel movements- no pain   Stroke (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   Suicide attempt [facility])     Past Surgical History:  Procedure Laterality Date   ANAL RECTAL MANOMETRY N/A 07/13/2016   Procedure: ANO RECTAL MANOMETRY;  Surgeon: Romie Levee, MD;  Location: [facility];  Service: Endoscopy;  Laterality: N/A;   CESAREAN SECTION     EUS N/A 11/21/2012   Procedure: LOWER ENDOSCOPIC ULTRASOUND (EUS);  Surgeon: Willis Modena, MD;  Location: [facility];  Service: Endoscopy;  Laterality: N/A;   FLEXIBLE SIGMOIDOSCOPY N/A 11/21/2012   Procedure: FLEXIBLE SIGMOIDOSCOPY;  Surgeon: Willis Modena, MD;  Location: [facility];  Service: Endoscopy;  Laterality: N/A;   FLEXIBLE SIGMOIDOSCOPY N/A 01/28/2013   Procedure: FLEXIBLE SIGMOIDOSCOPY;  Surgeon: Romie Levee, MD;  Location: [facility];  Service: Endoscopy;  Laterality: N/A;   LAPAROSCOPIC LOW ANTERIOR RESECTION N/A 01/29/2013   Procedure: LAPAROSCOPIC LOW ANTERIOR RESECTION, Rigid Proctoscopy;  Surgeon: Romie Levee, MD;  Location: [facility];  Service: General;  Laterality: N/A;   LAPAROSCOPIC SIGMOID COLECTOMY N/A 11/14/2012   Procedure: DIAGNOSTIC LAPAROSCOPY AND SIGMOIDMOIDOSCOPY ;  Surgeon: Emelia Loron, MD;  Location: [facility];  Service: General;  Laterality: N/A;   RECTAL ULTRASOUND N/A 07/13/2016   Procedure: RECTAL ULTRASOUND;  Surgeon: Romie Levee, MD;  Location: [facility];  Service: Endoscopy;  Laterality: N/A;   TONSILLECTOMY     TONSILLECTOMY AND ADENOIDECTOMY      Family History: Family History  Problem Relation Age of  Onset   Hypertension Father    Stroke Father    Diabetes Father    Heart disease Mother    Hypertension Mother    Hyperlipidemia Mother    Hypertension Sister    Hypertension Brother    Hypertension Sister    Hypertension Brother    Cancer Other 33       colon cancer    Cancer Maternal Aunt        cancer, unknown type    Cancer Maternal  Uncle        bone cancer    Cancer Paternal Aunt        lung cancer   Cancer Paternal Uncle        lung cancer   Cancer Maternal Uncle        colon cancer and prostate cancer    Cancer Maternal Uncle        prostate cancer     Social History  reports that she quit smoking about 5 years ago. Her smoking use included cigarettes. She started smoking about 25 years ago. She has a 10 pack-year smoking history. She has never used smokeless tobacco. She reports that she does not currently use alcohol. She reports that she does not currently use drugs after having used the following drugs: "Crack" cocaine.  Allergies  Allergen Reactions   Penicillins Hives, Itching and Swelling    Tongue swelling    Medications   Current Facility-Administered Medications:    sodium chloride flush (NS) 0.9 % injection 3 mL, 3 mL, Intravenous, Once, Concha Se, MD  Current Outpatient Medications:    acetaminophen (TYLENOL) 325 MG tablet, Take 2 tablets (650 mg total) by mouth 2 (two) times daily., Disp: , Rfl:    albuterol (VENTOLIN HFA) 108 (90 Base) MCG/ACT inhaler, Inhale 2 puffs into the lungs every 4 (four) hours as needed for wheezing., Disp: 1 g, Rfl: 5   amLODipine (NORVASC) 10 MG tablet, Take 1 tablet (10 mg total) by mouth daily., Disp: 30 tablet, Rfl: 0   aspirin EC 81 MG tablet, Take 1 tablet (81 mg total) by mouth daily. Swallow whole., Disp: 120 tablet, Rfl: 12   clopidogrel (PLAVIX) 75 MG tablet, Take 1 tablet (75 mg total) by mouth daily., Disp: 12 tablet, Rfl: 0   cyanocobalamin 1000 MCG tablet, Take 1 tablet (1,000 mcg total) by mouth daily for 30 days then as directed by MD, Disp: 130 tablet, Rfl: 0   diclofenac Sodium (VOLTAREN) 1 % GEL, SMARTSIG:4 Gram(s) Topical 4 Times Daily PRN, Disp: , Rfl:    gabapentin (NEURONTIN) 100 MG capsule, Take 1 capsule (100 mg total) by mouth 2 (two) times daily. Patient is taking as needed, Disp: 60 capsule, Rfl: 0   HYDROcodone-acetaminophen  (NORCO/VICODIN) 5-325 MG tablet, Take 1 tablet by mouth every 6 (six) hours as needed., Disp: , Rfl:    insulin glargine-yfgn (SEMGLEE) 100 UNIT/ML injection, Inject 0.3 mLs (30 Units total) into the skin daily., Disp: , Rfl:    Insulin Pen Needle (PEN NEEDLES) 32G X 6 MM MISC, 1 application by Does not apply route 2 (two) times daily., Disp: 100 each, Rfl: 1   naphazoline-glycerin (CLEAR EYES REDNESS) 0.012-0.25 % SOLN, Place 1-2 drops into both eyes 3 (three) times daily with meals., Disp: , Rfl:    pantoprazole (PROTONIX) 40 MG tablet, Take 40 mg by mouth daily., Disp: , Rfl:    Pirfenidone 801 MG TABS, TAKE 1 TABLET (801 MG) BY MOUTH WITH BREAKFAST, WITH  LUNCH, AND WITH EVENING MEAL. (Patient taking differently: Take 1 tablet by mouth daily. Patient is taking 1 tablet daily current with breakfast), Disp: 270 tablet, Rfl: 0   rosuvastatin (CRESTOR) 40 MG tablet, Take 1 tablet (40 mg total) by mouth daily., Disp: 30 tablet, Rfl: 0   sacubitril-valsartan (ENTRESTO) 49-51 MG, Take 1 tablet by mouth 2 (two) times daily., Disp: 60 tablet, Rfl: 0   senna-docusate (SENOKOT-S) 8.6-50 MG tablet, Take 1 tablet by mouth at bedtime., Disp: 30 tablet, Rfl: 0   sertraline (ZOLOFT) 50 MG tablet, Take 1 tablet (50 mg total) by mouth daily., Disp: 30 tablet, Rfl: 0   SYMBICORT 160-4.5 MCG/ACT inhaler, INHALE 2 PUFFS INTO THE LUNGS TWICE DAILY (Patient not taking: Reported on 03/04/2023), Disp: 10.2 g, Rfl: 5   thiamine (VITAMIN B1) 100 MG tablet, Take 1 tablet (100 mg total) by mouth daily., Disp: 30 tablet, Rfl: 0   traZODone (DESYREL) 100 MG tablet, Take 1 tablet (100 mg total) by mouth at bedtime., Disp: 30 tablet, Rfl: 0   venlafaxine XR (EFFEXOR-XR) 150 MG 24 hr capsule, Take 1 capsule (150 mg total) by mouth daily., Disp: 30 capsule, Rfl: 0  Vitals  There were no vitals filed for this visit.  There is no height or weight on file to calculate BMI.  Physical Exam   Constitutional: Appears well-developed  and well-nourished.   Neurologic Examination   Neuro: Mental Status: Patient is slightly drowsy, oriented to age, but gives month as september She has slow, very hesitant speech.  Cranial Nerves: II: Visual Fields are full. Pupils are equal, round, and reactive to light.   III,IV, VI: EOMI without ptosis or diploplia.  V: Facial sensation is diminished on the right  VII: Facial movement is reduced bilaterally Motor: She gives poor effort bilaterally, but with repeated stimulation and insistence, she is able to hold both her arms against gravity, though she drifts in both of them.  She drifts more quickly with the left leg than the right. Sensory: Sensation is reduced in the right arm and symmetric in the legs Cerebellar: No definite ataxia, though she does not cooperate formal testing.       Labs   CBC:  Recent Labs  Lab 03/13/23 0656  WBC 3.6*  HGB 10.8*  HCT 30.7*  MCV 90.8  PLT 133*    Basic Metabolic Panel:  Lab Results  Component Value Date   NA 139 03/15/2023   K 4.0 03/15/2023   CO2 18 (L) 03/15/2023   GLUCOSE 134 (H) 03/15/2023   BUN 28 (H) 03/15/2023   CREATININE 1.57 (H) 03/15/2023   CALCIUM 8.5 (L) 03/15/2023   GFRNONAA 36 (L) 03/15/2023   GFRAA 42 (L) 11/30/2020   Lipid Panel:  Lab Results  Component Value Date   LDLCALC 86 03/05/2023   HgbA1c:  Lab Results  Component Value Date   HGBA1C 6.9 (H) 03/05/2023   Urine Drug Screen:     Component Value Date/Time   LABOPIA NONE DETECTED 03/04/2023 0830   LABOPIA NONE DETECTED 06/26/2020 0643   COCAINSCRNUR NONE DETECTED 03/04/2023 0830   LABBENZ NONE DETECTED 03/04/2023 0830   LABBENZ NONE DETECTED 06/26/2020 0643   AMPHETMU NONE DETECTED 03/04/2023 0830   AMPHETMU NONE DETECTED 06/26/2020 0643   THCU NONE DETECTED 03/04/2023 0830   THCU NONE DETECTED 06/26/2020 0643   LABBARB NONE DETECTED 03/04/2023 0830   LABBARB NONE DETECTED 06/26/2020 0643    Alcohol Level     Component Value  Date/Time  ETH 21 (H) 05/09/2019 0133   INR  Lab Results  Component Value Date   INR 1.3 (H) 11/22/2021   APTT  Lab Results  Component Value Date   APTT 28 11/22/2021   AED levels: No results found for: "PHENYTOIN", "ZONISAMIDE", "LAMOTRIGINE", "LEVETIRACETA"   CT Head without contrast(Personally reviewed): Negative  CT angio Head and Neck with contrast(Personally reviewed): Negative  Impression   MYLYNN ROMANICK is a 67 y.o. female with a history of recent stroke who presents with decreased responsiveness followed by right-sided weakness.  It is possible that she has had recurrent stroke, but I also think it is possible that she has some both metabolic encephalopathy given her lethargy.  She will need further evaluation both from a metabolic standpoint as well as structural.  Recommendations  MRI brain UA, UDS, ammonia, VBG Further recommendations following the above ______________________________________________________________________    Stormy Card

## 2023-03-18 NOTE — ED Notes (Signed)
Pt to MRI

## 2023-03-18 NOTE — Assessment & Plan Note (Addendum)
Long-acting insulin Semglee 30 units not resumed on admission, pending med reconciliation A1c is 6.9 Insulin SSI with agents coverage ordered

## 2023-03-18 NOTE — ED Provider Notes (Signed)
[facility] Provider Note    Event Date/Time   First MD Initiated Contact with Patient 03/18/23 1403     (approximate)   History   Code Stroke   HPI  Erica [name] is a 67 y.o. female  who just had stroke a few days ago, pt has been sleepy since then. She was walking at 1230 and family found her slumped over and difficult to arouse.  She is complaining of R sided numbness which she didn't have before.   I discussed more with his daughter who she is currently living with where she was sitting outside on a chair when she slumped over did hit her head.  Denies ever having this kind of a syncopal episode before.  They report that she has been a little bit more lethargic since being discharged but this time as they could not get her up at all.   Physical Exam   Triage Vital Signs: ED Triage Vitals  Encounter Vitals Group     BP      Systolic BP Percentile      Diastolic BP Percentile      Pulse      Resp      Temp      Temp src      SpO2      Weight      Height      Head Circumference      Peak Flow      Pain Score      Pain Loc      Pain Education      Exclude from Growth Chart     Most recent vital signs: There were no vitals filed for this visit.   General: Awake, no distress.  CV:  Good peripheral perfusion.  Resp:  Normal effort.  Abd:  No distention.  Other:  R sided numbness    ED Results / Procedures / Treatments   Labs (all labs ordered are listed, but only abnormal results are displayed) Labs Reviewed  PROTIME-INR  APTT  CBC  DIFFERENTIAL  COMPREHENSIVE METABOLIC PANEL  ETHANOL  I-STAT CREATININE, ED  CBG MONITORING, ED     EKG  My interpretation of EKG:  Check that but I did not normal sinus rate of 70 without any ST elevation or T wave inversions, normal intervals  RADIOLOGY I have reviewed the CT pesronally and interpretted and does have report infarct  PROCEDURES:  Critical Care performed: Yes, see  critical care procedure note(s)  .1-3 Lead EKG Interpretation  Performed by: Concha Se, MD Authorized by: Concha Se, MD     Interpretation: normal     ECG rate assessment: normal     Rhythm: sinus rhythm     Ectopy: none     Conduction: normal   .Critical Care  Performed by: Concha Se, MD Authorized by: Concha Se, MD   Critical care provider statement:    Critical care time (minutes):  30   Critical care was necessary to treat or prevent imminent or life-threatening deterioration of the following conditions:  CNS failure or compromise   Critical care was time spent personally by me on the following activities:  Development of treatment plan with patient or surrogate, discussions with consultants, evaluation of patient's response to treatment, examination of patient, ordering and review of laboratory studies, ordering and review of radiographic studies, ordering and performing treatments and interventions, pulse oximetry, re-evaluation of patient's condition and review of  old charts    MEDICATIONS ORDERED IN ED: Medications  sodium chloride flush (NS) 0.9 % injection 3 mL (has no administration in time range)  iohexol (OMNIPAQUE) 350 MG/ML injection 60 mL (60 mLs Intravenous Contrast Given 03/18/23 1349)     IMPRESSION / MDM / ASSESSMENT AND PLAN / ED COURSE  I reviewed the triage vital signs and the nursing notes.   Patient's presentation is most consistent with acute presentation with potential threat to life or bodily function.   Differential includes stroke, intracranial hemorrhage, metabolic in nature, you are in, hospital delirium.  Discussed with neurology given stroke code was called from triage and not tPA candidate and no LVO on CTA.  Patient will need MRI however given the numbness and tingling.  Given patient's altered mental status patient will require admission to the hospital but will get labs to make sure there is nothing else going on  metabolically  Blood work so far is reassuring ethanol negative CMP reassuring creatinine.  Coags reassuring.  Discussed further with family they do say that she kind of hit the side of her head and neck to and she does report a little bit of neck pain so we will add on a CT cervical.  Discussed with him neck step is given this concern for potential syncope and episode of altered mental status and will admit for observation given they state that she is too weak to walk and go home.  The patient is on the cardiac monitor to evaluate for evidence of arrhythmia and/or significant heart rate changes.      FINAL CLINICAL IMPRESSION(S) / ED DIAGNOSES   Final diagnoses:  Syncope, unspecified syncope type     Rx / DC Orders   ED Discharge Orders     None        Note:  This document was prepared using Dragon voice recognition software and may include unintentional dictation errors.   Concha Se, MD 03/18/23 [phone]

## 2023-03-18 NOTE — ED Notes (Signed)
Pt assisted to bedside commode by EDT, returned to bed independently. I verified call bell is within reach, pt states no needs at this time. Bedside commode emptied.

## 2023-03-18 NOTE — Assessment & Plan Note (Signed)
Hydralazine 5 mg IV every 6 hours as needed for SBP greater 180, 4 days ordered
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
## 2023-03-18 NOTE — Assessment & Plan Note (Signed)
Resumed home aspirinAn 81 g daily , Plavix 75 mg daily

## 2023-03-18 NOTE — Assessment & Plan Note (Addendum)
Acute punctate nonhemorrhagic infarct of the left thalamus-neurology has been consulted, pending further recommendation Fall, aspiration, Complete echo on 03/05/2023: Estimated ejection fraction 65 to 70%, grade 1 diastolic dysfunction. MRI brain wo contrast completed, pending radiology read Chest x-ray, 2 view ordered on admission Fall, aspiration precaution Admit to telemetry cardiac, inpatient
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
## 2023-03-18 NOTE — ED Notes (Signed)
Transported to inpatient room by [employer]

## 2023-03-18 NOTE — Progress Notes (Signed)
   03/18/23 1340  Spiritual Encounters  Type of Visit Initial;Attempt (pt unavailable)  Care provided to: Family  Referral source Code page  Reason for visit Code  OnCall Visit Yes   Chaplain responded to Code Stroke and provide support to family.

## 2023-03-18 NOTE — ED Triage Notes (Signed)
Pt arrived via [facility] from home with stroke like symptoms. Per EMS pt had heard a noise outside and went to check what that noise was. Family found pt "slumped over/lethargic on a bench." Pt had left sided weakness from a stroke this past Wednesday. Per EMS pt has an LVO of 1 due to right arm drift. Pt has equal sensation in all extremities and equal but weak grip in all hands.

## 2023-03-19 ENCOUNTER — Other Ambulatory Visit: Payer: Self-pay

## 2023-03-19 DIAGNOSIS — R55 Syncope and collapse: Secondary | ICD-10-CM | POA: Diagnosis not present

## 2023-03-19 DIAGNOSIS — E1142 Type 2 diabetes mellitus with diabetic polyneuropathy: Secondary | ICD-10-CM

## 2023-03-19 DIAGNOSIS — Z794 Long term (current) use of insulin: Secondary | ICD-10-CM

## 2023-03-19 DIAGNOSIS — J849 Interstitial pulmonary disease, unspecified: Secondary | ICD-10-CM | POA: Diagnosis not present

## 2023-03-19 DIAGNOSIS — N1832 Chronic kidney disease, stage 3b: Secondary | ICD-10-CM

## 2023-03-19 DIAGNOSIS — I10 Essential (primary) hypertension: Secondary | ICD-10-CM

## 2023-03-19 DIAGNOSIS — I6310 Cerebral infarction due to embolism of unspecified precerebral artery: Secondary | ICD-10-CM

## 2023-03-19 DIAGNOSIS — E1165 Type 2 diabetes mellitus with hyperglycemia: Secondary | ICD-10-CM

## 2023-03-19 LAB — CBC
HCT: 28.8 % — ABNORMAL LOW (ref 36.0–46.0)
Hemoglobin: 10.5 g/dL — ABNORMAL LOW (ref 12.0–15.0)
MCH: 32.3 pg (ref 26.0–34.0)
MCHC: 36.5 g/dL — ABNORMAL HIGH (ref 30.0–36.0)
MCV: 88.6 fL (ref 80.0–100.0)
Platelets: 147 10*3/uL — ABNORMAL LOW (ref 150–400)
RBC: 3.25 MIL/uL — ABNORMAL LOW (ref 3.87–5.11)
RDW: 15.9 % — ABNORMAL HIGH (ref 11.5–15.5)
WBC: 3.7 10*3/uL — ABNORMAL LOW (ref 4.0–10.5)
nRBC: 0 % (ref 0.0–0.2)

## 2023-03-19 LAB — BASIC METABOLIC PANEL
Anion gap: 8 (ref 5–15)
BUN: 24 mg/dL — ABNORMAL HIGH (ref 8–23)
CO2: 20 mmol/L — ABNORMAL LOW (ref 22–32)
Calcium: 7.9 mg/dL — ABNORMAL LOW (ref 8.9–10.3)
Chloride: 111 mmol/L (ref 98–111)
Creatinine, Ser: 1.27 mg/dL — ABNORMAL HIGH (ref 0.44–1.00)
GFR, Estimated: 46 mL/min — ABNORMAL LOW (ref >60)
Glucose, Bld: 198 mg/dL — ABNORMAL HIGH (ref 70–99)
Potassium: 4 mmol/L (ref 3.5–5.1)
Sodium: 139 mmol/L (ref 135–145)

## 2023-03-19 LAB — GLUCOSE, CAPILLARY
Glucose-Capillary: 162 mg/dL — ABNORMAL HIGH (ref 70–99)
Glucose-Capillary: 155 mg/dL — ABNORMAL HIGH (ref 70–99)
Glucose-Capillary: 230 mg/dL — ABNORMAL HIGH (ref 70–99)

## 2023-03-19 MED ORDER — ALBUTEROL SULFATE (2.5 MG/3ML) 0.083% IN NEBU: 2.5000 mg | INHALATION_SOLUTION | RESPIRATORY_TRACT | Status: DC | PRN

## 2023-03-19 NOTE — Plan of Care (Signed)
  Problem: Education: Goal: Knowledge of disease or condition will improve Outcome: Progressing   Problem: Ischemic Stroke/TIA Tissue Perfusion: Goal: Complications of ischemic stroke/TIA will be minimized Outcome: Progressing   Problem: Coping: Goal: Will identify appropriate support needs Outcome: Progressing   Problem: Health Behavior/Discharge Planning: Goal: Ability to manage health-related needs will improve Outcome: Progressing Goal: Goals will be collaboratively established with patient/family Outcome: Progressing   Problem: Self-Care: Goal: Ability to participate in self-care as condition permits will improve Outcome: Progressing

## 2023-03-19 NOTE — Progress Notes (Signed)
Patient is frustrated at this time and does not want staff to take vitals, cbg or wear tele box. Sister at bedside. States patient get this way when she is upset about having to stay in hospital.  Informed sister to call with any questions and notifiy me when she leaves

## 2023-03-19 NOTE — Plan of Care (Signed)
  Problem: Education: Goal: Knowledge of disease or condition will improve Outcome: Progressing Goal: Knowledge of secondary prevention will improve (MUST DOCUMENT ALL) Outcome: Progressing Goal: Knowledge of patient specific risk factors will improve Erica [name] N/A or DELETE if not current risk factor) Outcome: Progressing   Problem: Ischemic Stroke/TIA Tissue Perfusion: Goal: Complications of ischemic stroke/TIA will be minimized Outcome: Progressing   Problem: Coping: Goal: Will verbalize positive feelings about self Outcome: Progressing Goal: Will identify appropriate support needs Outcome: Progressing   Problem: Health Behavior/Discharge Planning: Goal: Ability to manage health-related needs will improve Outcome: Progressing Goal: Goals will be collaboratively established with patient/family Outcome: Progressing   Problem: Self-Care: Goal: Ability to participate in self-care as condition permits will improve Outcome: Progressing Goal: Verbalization of feelings and concerns over difficulty with self-care will improve Outcome: Progressing Goal: Ability to communicate needs accurately will improve Outcome: Progressing   Problem: Health Behavior/Discharge Planning: Goal: Ability to manage health-related needs will improve Outcome: Progressing   Problem: Activity: Goal: Risk for activity intolerance will decrease Outcome: Progressing   Problem: Nutrition: Goal: Adequate nutrition will be maintained Outcome: Progressing   Problem: Coping: Goal: Level of anxiety will decrease Outcome: Progressing

## 2023-03-19 NOTE — Progress Notes (Addendum)
NEUROLOGY CONSULT FOLLOW UP NOTE   Date of service: March 19, 2023 Patient Name: Erica [name] MRN:  [id number] DOB:  Aug 04, 1955  Brief HPI  Erica [name] is a 67 y.o. 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 who presented with  syncope followed by right sided numbness. MRI shows stroke.    Interval Hx/subjective   Feels slightly better.   Vitals   Vitals:   03/18/23 2056 03/18/23 2115 03/19/23 0300 03/19/23 0735  BP: (!) 142/78 (!) 147/77 125/65 (!) 146/77  Pulse: (!) 57 (!) 58 66 (!) 59  Resp: 15 16 16 16   Temp: 98 F (36.7 C) 98.2 F (36.8 C) 98.1 F (36.7 C) 98.4 F (36.9 C)  TempSrc: Oral   Oral  SpO2: 100% 100% 100% 95%  Weight:   75.5 kg      Body mass index is 29.48 kg/m.  Physical Exam   Constitutional: in bed, NAD  Neurologic Examination     Labs and Diagnostic Imaging   CBC:  Recent Labs  Lab 03/18/23 1400 03/19/23 0438  WBC 4.4 3.7*  NEUTROABS 3.2  --   HGB 10.6* 10.5*  HCT 29.8* 28.8*  MCV 89.8 88.6  PLT 133* 147*    Basic Metabolic Panel:  Lab Results  Component Value Date   NA 139 03/19/2023   K 4.0 03/19/2023   CO2 20 (L) 03/19/2023   GLUCOSE 198 (H) 03/19/2023   BUN 24 (H) 03/19/2023   CREATININE 1.27 (H) 03/19/2023   CALCIUM 7.9 (L) 03/19/2023   GFRNONAA 46 (L) 03/19/2023   GFRAA 42 (L) 11/30/2020   Lipid Panel:  Lab Results  Component Value Date   LDLCALC 86 03/05/2023   HgbA1c:  Lab Results  Component Value Date   HGBA1C 6.9 (H) 03/05/2023   Urine Drug Screen:     Component Value Date/Time   LABOPIA NONE  DETECTED 03/18/2023 1635   LABOPIA NONE DETECTED 06/26/2020 0643   COCAINSCRNUR NONE DETECTED 03/18/2023 1635   LABBENZ NONE DETECTED 03/18/2023 1635   LABBENZ NONE DETECTED 06/26/2020 0643   AMPHETMU NONE DETECTED 03/18/2023 1635   AMPHETMU NONE DETECTED 06/26/2020 0643   THCU NONE DETECTED 03/18/2023 1635   THCU NONE DETECTED 06/26/2020 0643   LABBARB NONE DETECTED 03/18/2023 1635   LABBARB NONE DETECTED 06/26/2020 0643    Alcohol Level     Component Value Date/Time   ETH <10 03/18/2023 1400   INR  Lab Results  Component Value Date   INR 1.2 03/18/2023   APTT  Lab Results  Component Value Date   APTT 29 03/18/2023   Impression   Erica [name] is a 67 y.o. female with a history of multipel recent strokes with recurrent stroke in the setting of syncope. I suspect that she had a BP drop causing syncope and infarcted a tenuous small vessel area in that setting given that I would not expect that stroke to cause syncope. In any case, I would treat this as a small vessel ischemic stroke and prevention would focus on small vessel risk factors.   Recommendations  Continue DAPT for 21 days from yesterday Continue crestor 40mg  daily Orthostatic vital signs I would favor loop interrogation to look if  an arrhythmia could have contributed to her syncope.   ______________________________________________________________________   Thank you for the opportunity to take part in the care of this patient. If you have any further questions, please contact the neurology consultation team on call. Updated oncall schedule is listed on AMION.  Signed,  Ritta Slot
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
## 2023-03-20 ENCOUNTER — Ambulatory Visit: Payer: Medicare HMO

## 2023-03-20 DIAGNOSIS — R569 Unspecified convulsions: Secondary | ICD-10-CM

## 2023-03-20 DIAGNOSIS — J849 Interstitial pulmonary disease, unspecified: Secondary | ICD-10-CM | POA: Diagnosis not present

## 2023-03-20 DIAGNOSIS — I639 Cerebral infarction, unspecified: Secondary | ICD-10-CM | POA: Diagnosis not present

## 2023-03-20 DIAGNOSIS — N1832 Chronic kidney disease, stage 3b: Secondary | ICD-10-CM | POA: Diagnosis not present

## 2023-03-20 DIAGNOSIS — E1142 Type 2 diabetes mellitus with diabetic polyneuropathy: Secondary | ICD-10-CM | POA: Diagnosis not present

## 2023-03-20 DIAGNOSIS — R55 Syncope and collapse: Secondary | ICD-10-CM | POA: Diagnosis not present

## 2023-03-20 LAB — GLUCOSE, CAPILLARY
Glucose-Capillary: 129 mg/dL — ABNORMAL HIGH (ref 70–99)
Glucose-Capillary: 133 mg/dL — ABNORMAL HIGH (ref 70–99)
Glucose-Capillary: 256 mg/dL — ABNORMAL HIGH (ref 70–99)
Glucose-Capillary: 103 mg/dL — ABNORMAL HIGH (ref 70–99)
Glucose-Capillary: 243 mg/dL — ABNORMAL HIGH (ref 70–99)

## 2023-03-20 MED ORDER — STROKE: EARLY STAGES OF RECOVERY BOOK: Freq: Once | Status: AC

## 2023-03-20 MED ORDER — AMLODIPINE BESYLATE 10 MG PO TABS
10.0000 mg | ORAL_TABLET | Freq: Every day | ORAL | Status: DC
  Administered 2023-03-21: 10 mg via ORAL
  Filled 2023-03-20: qty 1
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
## 2023-03-20 NOTE — Procedures (Signed)
Patient Name: ELENORA HAWBAKER  MRN: [id number]  Epilepsy Attending: Charlsie Quest  Referring Physician/Provider:  Date: 03/20/2023 Duration: 30.50 mins  Patient history: 67 year old female admitted with syncopal episode followed by foal right sided symptoms.   Level of alertness: Awake, asleep  AEDs during EEG study: None  Technical aspects: This EEG study was done with scalp electrodes positioned according to the 10-20 International system of electrode placement. Electrical activity was reviewed with band pass filter of 1-70Hz , sensitivity of 7 uV/mm, display speed of 93mm/sec with a 60Hz  notched filter applied as appropriate. EEG data were recorded continuously and digitally stored.  Video monitoring was available and reviewed as appropriate.  Description: The posterior dominant rhythm consists of 8-9 Hz activity of moderate voltage (25-35 uV) seen predominantly in posterior head regions, symmetric and reactive to eye opening and eye closing. Sleep was characterized by vertex waves, sleep spindles (12 to 14 Hz), maximal frontocentral region. EEG showed intermittent 3 to 5 Hz theta-delta slowing in left temporal region. Hyperventilation and photic stimulation were not performed.     ABNORMALITY - Intermittent slow, left temporal region  IMPRESSION: This study is suggestive of cortical dysfunction arising from left temporal region. No seizures or definite epileptiform discharges were seen throughout the recording.  Please note lack of epileptiform abnormality during interictal EEG does not exclude the diagnosis of epilepsy.   Mckell Riecke Annabelle Harman

## 2023-03-20 NOTE — Progress Notes (Signed)
Eeg done 

## 2023-03-20 NOTE — Progress Notes (Signed)
Subjective: 67 year old female admitted with syncopal episode followed by foal right sided symptoms.  Today patient is lethargic but oriented.  BP low while head up in bed.  No focal findings.    Objective: Current vital signs: BP (!) 143/66 (BP Location: Right Arm)   Pulse (!) 59   Temp 98.3 F (36.8 C)   Resp 18   Wt 75.5 kg   SpO2 99%   BMI 29.48 kg/m  Vital signs in last 24 hours: Temp:  [97.8 F (36.6 C)-98.5 F (36.9 C)] 98.3 F (36.8 C) (10/07 0753) Pulse Rate:  [59-73] 59 (10/07 0753) Resp:  [16-19] 18 (10/07 0753) BP: (116-143)/(66-84) 143/66 (10/07 0753) SpO2:  [99 %-100 %] 99 % (10/07 0753)  Intake/Output from previous day: No intake/output data recorded. Intake/Output this shift: Total I/O In: 120 [P.O.:120] Out: -  Nutritional status:  Diet Order             Diet Heart Room service appropriate? Yes; Fluid consistency: Thin  Diet effective now                   Neurologic Exam: Mental Status: Lethargic.  Speech slurred but no evidence of aphasia.  Orient to place but does not know the day.   Cranial Nerves: II: Visual fields grossly normal III,IV, VI: Extra-ocular motions intact bilaterally V,VII: decreased left NLF VIII: hearing normal bilaterally XI: bilateral shoulder shrug XII: midline tongue extension Motor: 5/5 throughout Sensory: Pinprick and light touch intact throughout, bilaterally   Lab Results: Results for orders placed or performed during the hospital encounter of 03/18/23 (from the past 48 hour(s))  Protime-INR     Status: Abnormal   Collection Time: 03/18/23  2:00 PM  Result Value Ref Range   Prothrombin Time 15.5 (H) 11.4 - 15.2 seconds   INR 1.2 0.8 - 1.2    Comment: (NOTE) INR goal varies based on device and disease states. Performed at [facility], [address] [place], [place] [address]   APTT     Status: None   Collection Time: 03/18/23  2:00 PM  Result Value Ref Range   aPTT 29 24 - 36 seconds     Comment: Performed at [facility], [address] [place], [place] [address]  CBC     Status: Abnormal   Collection Time: 03/18/23  2:00 PM  Result Value Ref Range   WBC 4.4 4.0 - 10.5 K/uL   RBC 3.32 (L) 3.87 - 5.11 MIL/uL   Hemoglobin 10.6 (L) 12.0 - 15.0 g/dL   HCT 09.8 (L) 11.9 - 14.7 %   MCV 89.8 80.0 - 100.0 fL   MCH 31.9 26.0 - 34.0 pg   MCHC 35.6 30.0 - 36.0 g/dL   RDW 82.9 (H) 56.2 - 13.0 %   Platelets 133 (L) 150 - 400 K/uL   nRBC 0.0 0.0 - 0.2 %    Comment: Performed at [facility], [address] [place], [place] [address]  Differential     Status: None   Collection Time: 03/18/23  2:00 PM  Result Value Ref Range   Neutrophils Relative % 72 %   Neutro Abs 3.2 1.7 - 7.7 K/uL   Lymphocytes Relative 20 %   Lymphs Abs 0.9 0.7 - 4.0 K/uL   Monocytes Relative 5 %   Monocytes Absolute 0.2 0.1 - 1.0 K/uL   Eosinophils Relative 3 %   Eosinophils Absolute 0.1 0.0 - 0.5 K/uL   Basophils Relative  0 %   Basophils Absolute 0.0 0.0 - 0.1 K/uL   Immature Granulocytes 0 %   Abs Immature Granulocytes 0.01 0.00 - 0.07 K/uL    Comment: Performed at [facility], [address] [place], [place] [address]  Comprehensive metabolic panel     Status: Abnormal   Collection Time: 03/18/23  2:00 PM  Result Value Ref Range   Sodium 141 135 - 145 mmol/L   Potassium 4.2 3.5 - 5.1 mmol/L   Chloride 112 (H) 98 - 111 mmol/L   CO2 21 (L) 22 - 32 mmol/L   Glucose, Bld 149 (H) 70 - 99 mg/dL    Comment: Glucose reference range applies only to samples taken after fasting for at least 8 hours.   BUN 31 (H) 8 - 23 mg/dL   Creatinine, Ser 6.38 (H) 0.44 - 1.00 mg/dL   Calcium 8.1 (L) 8.9 - 10.3 mg/dL   Total Protein 6.1 (L) 6.5 - 8.1 g/dL   Albumin 3.0 (L) 3.5 - 5.0 g/dL   AST 19 15 - 41 U/L   ALT 10 0 - 44 U/L   Alkaline Phosphatase 110 38 - 126 U/L   Total Bilirubin 0.8 0.3 - 1.2 mg/dL   GFR, Estimated 37 (L) >60 mL/min    Comment: (NOTE) Calculated using the  CKD-EPI Creatinine Equation (2021)    Anion gap 8 5 - 15    Comment: Performed at [facility], [address] [place], [place] [address]  Ethanol     Status: None   Collection Time: 03/18/23  2:00 PM  Result Value Ref Range   Alcohol, Ethyl (B) <10 <10 mg/dL    Comment: (NOTE) Lowest detectable limit for serum alcohol is 10 mg/dL.  For medical purposes only. Performed at [facility], [address] [place], [place] [address]   Blood gas, venous     Status: Abnormal (Preliminary result)   Collection Time: 03/18/23  2:43 PM  Result Value Ref Range   pH, Ven 7.29 7.25 - 7.43   pCO2, Ven 47 44 - 60 mmHg   pO2, Ven PENDING 32 - 45 mmHg   Bicarbonate 22.6 20.0 - 28.0 mmol/L   Acid-base deficit 4.2 (H) 0.0 - 2.0 mmol/L   O2 Saturation PENDING %   Patient temperature 37.0    Collection site VENOUS     Comment: Performed at [facility], [address] [place], [place] [address]  Ammonia     Status: None   Collection Time: 03/18/23  3:39 PM  Result Value Ref Range   Ammonia 11 9 - 35 umol/L    Comment: Performed at [facility], [address] [place], [place] [address]  Urinalysis, Routine w reflex microscopic -Urine, Clean Catch     Status: Abnormal   Collection Time: 03/18/23  4:35 PM  Result Value Ref Range   Color, Urine STRAW (A) YELLOW   APPearance CLEAR (A) CLEAR   Specific Gravity, Urine 1.021 1.005 - 1.030   pH 5.0 5.0 - 8.0   Glucose, UA NEGATIVE NEGATIVE mg/dL   Hgb urine dipstick NEGATIVE NEGATIVE   Bilirubin Urine NEGATIVE NEGATIVE   Ketones, ur NEGATIVE NEGATIVE mg/dL   Protein, ur 160 (A) NEGATIVE mg/dL   Nitrite NEGATIVE NEGATIVE   Leukocytes,Ua NEGATIVE NEGATIVE   RBC / HPF 0 0 - 5 RBC/hpf   WBC, UA 0-5 0 - 5 WBC/hpf   Bacteria, UA NONE SEEN NONE SEEN   Squamous Epithelial / HPF  0-5 0 - 5 /HPF    Comment: Performed at [facility], [address] [place], [place] [address]  Urine Drug Screen,  Qualitative [facility] only)     Status: None   Collection Time: 03/18/23  4:35 PM  Result Value Ref Range   Tricyclic, Ur Screen NONE DETECTED NONE DETECTED   Amphetamines, Ur Screen NONE DETECTED NONE DETECTED   MDMA (Ecstasy)Ur Screen NONE DETECTED NONE DETECTED   Cocaine Metabolite,Ur [place] NONE DETECTED NONE DETECTED   Opiate, Ur Screen NONE DETECTED NONE DETECTED   Phencyclidine (PCP) Ur S NONE DETECTED NONE DETECTED   Cannabinoid 50 Ng, Ur [place] NONE DETECTED NONE DETECTED   Barbiturates, Ur Screen NONE DETECTED NONE DETECTED   Benzodiazepine, Ur Scrn NONE DETECTED NONE DETECTED   Methadone Scn, Ur NONE DETECTED NONE DETECTED    Comment: (NOTE) Tricyclics + metabolites, urine    Cutoff 1000 ng/mL Amphetamines + metabolites, urine  Cutoff 1000 ng/mL MDMA (Ecstasy), urine              Cutoff 500 ng/mL Cocaine Metabolite, urine          Cutoff 300 ng/mL Opiate + metabolites, urine        Cutoff 300 ng/mL Phencyclidine (PCP), urine         Cutoff 25 ng/mL Cannabinoid, urine                 Cutoff 50 ng/mL Barbiturates + metabolites, urine  Cutoff 200 ng/mL Benzodiazepine, urine              Cutoff 200 ng/mL Methadone, urine                   Cutoff 300 ng/mL  The urine drug screen provides only a preliminary, unconfirmed analytical test result and should not be used for non-medical purposes. Clinical consideration and professional judgment should be applied to any positive drug screen result due to possible interfering substances. A more specific alternate chemical method must be used in order to obtain a confirmed analytical result. Gas chromatography / mass spectrometry (GC/MS) is the preferred confirm atory method. Performed at [facility], [address] [place], [place] [address]   Glucose, capillary     Status: Abnormal   Collection Time: 03/18/23 10:19 PM  Result Value Ref Range   Glucose-Capillary 175 (H) 70 - 99 mg/dL    Comment: Glucose reference range applies  only to samples taken after fasting for at least 8 hours.  Basic metabolic panel     Status: Abnormal   Collection Time: 03/19/23  4:38 AM  Result Value Ref Range   Sodium 139 135 - 145 mmol/L   Potassium 4.0 3.5 - 5.1 mmol/L   Chloride 111 98 - 111 mmol/L   CO2 20 (L) 22 - 32 mmol/L   Glucose, Bld 198 (H) 70 - 99 mg/dL    Comment: Glucose reference range applies only to samples taken after fasting for at least 8 hours.   BUN 24 (H) 8 - 23 mg/dL   Creatinine, Ser 0.98 (H) 0.44 - 1.00 mg/dL   Calcium 7.9 (L) 8.9 - 10.3 mg/dL   GFR, Estimated 46 (L) >60 mL/min    Comment: (NOTE) Calculated using the CKD-EPI Creatinine Equation (2021)    Anion gap 8 5 - 15    Comment: Performed at [facility], [address], [place], [place] [address]  CBC     Status: Abnormal   Collection Time: 03/19/23  4:38 AM  Result Value Ref Range   WBC 3.7 (L) 4.0 - 10.5 K/uL   RBC 3.25 (L) 3.87 - 5.11 MIL/uL   Hemoglobin 10.5 (L) 12.0 - 15.0 g/dL   HCT 09.8 (L) 11.9 - 14.7 %   MCV 88.6 80.0 - 100.0 fL   MCH 32.3 26.0 - 34.0 pg   MCHC 36.5 (H) 30.0 - 36.0 g/dL   RDW 82.9 (H) 56.2 - 13.0 %   Platelets 147 (L) 150 - 400 K/uL   nRBC 0.0 0.0 - 0.2 %    Comment: Performed at [facility], [address] [place], [place] [address]  Glucose, capillary     Status: Abnormal   Collection Time: 03/19/23  7:36 AM  Result Value Ref Range   Glucose-Capillary 162 (H) 70 - 99 mg/dL    Comment: Glucose reference range applies only to samples taken after fasting for at least 8 hours.  Glucose, capillary     Status: Abnormal   Collection Time: 03/19/23  4:35 PM  Result Value Ref Range   Glucose-Capillary 155 (H) 70 - 99 mg/dL    Comment: Glucose reference range applies only to samples taken after fasting for at least 8 hours.  Glucose, capillary     Status: Abnormal   Collection Time: 03/19/23  9:16 PM  Result Value Ref Range   Glucose-Capillary 230 (H) 70 - 99 mg/dL    Comment: Glucose  reference range applies only to samples taken after fasting for at least 8 hours.  Glucose, capillary     Status: Abnormal   Collection Time: 03/20/23  6:03 AM  Result Value Ref Range   Glucose-Capillary 129 (H) 70 - 99 mg/dL    Comment: Glucose reference range applies only to samples taken after fasting for at least 8 hours.  Glucose, capillary     Status: Abnormal   Collection Time: 03/20/23  8:23 AM  Result Value Ref Range   Glucose-Capillary 133 (H) 70 - 99 mg/dL    Comment: Glucose reference range applies only to samples taken after fasting for at least 8 hours.  Glucose, capillary     Status: Abnormal   Collection Time: 03/20/23 12:19 PM  Result Value Ref Range   Glucose-Capillary 256 (H) 70 - 99 mg/dL    Comment: Glucose reference range applies only to samples taken after fasting for at least 8 hours.    No results found for this or any previous visit (from the past 240 hour(s)).  Lipid Panel No results for input(s): "CHOL", "TRIG", "HDL", "CHOLHDL", "VLDL", "LDLCALC" in the last 72 hours.  Studies/Results: MR BRAIN WO CONTRAST  Result Date: 03/18/2023 CLINICAL DATA:  Delirium. Patient was found slumped over/lethargic on bench. Left-sided weakness from previous infarct. New right arm drift. EXAM: MRI HEAD WITHOUT CONTRAST TECHNIQUE: Multiplanar, multiecho pulse sequences of the brain and surrounding structures were obtained without intravenous contrast. COMPARISON:  CT head and CT angio head and neck 03/18/2023. MR head 03/04/2023. FINDINGS: Brain: Previously noted subcortical infarcts of the centrum semiovale demonstrate expected change in are only faintly seen on the fusion weighted images without definite restricted diffusion. Acute punctate nonhemorrhagic infarct is present within the left thalamus. Remote cortical infarcts involving the occipital lobes bilaterally are stable. Remote watershed territory infarcts and diffuse subcortical white matter disease is also stable. Remote  lacunar infarcts are again noted within the basal ganglia and thalami. Remote lacunar infarcts of the cerebellum bilaterally are stable. The ventricles are of normal size. No significant  extraaxial fluid collection is present. The internal auditory canals are within normal limits. Midline structures are within normal limits. Vascular: Flow is present in the major intracranial arteries. Skull and upper cervical spine: The craniocervical junction is normal. Upper cervical spine is within normal limits. Marrow signal is unremarkable. Sinuses/Orbits: The paranasal sinuses and mastoid air cells are clear. The globes and orbits are within normal limits. IMPRESSION: 1. Acute punctate nonhemorrhagic infarct of the left thalamus. 2. Previously noted subcortical infarcts of the centrum semi ovale demonstrate expected change in are only faintly seen on the fusion weighted images without definite restricted diffusion. 3. 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 4. Remote watershed territory infarcts and diffuse subcortical white matter disease is also stable. 5. Remote lacunar infarcts of the basal ganglia and cerebellum bilaterally are stable. The above was relayed via text pager to [facility] on 03/18/2023 at 17:49 . Electronically Signed   By: Marin Roberts M.D.   On: 03/18/2023 17:49   X-ray chest PA and lateral  Result Date: 03/18/2023 CLINICAL DATA:  Syncopal episode.  Code stroke. EXAM: CHEST - 2 VIEW COMPARISON:  03/04/2023 FINDINGS: Examination is degraded due to patient body habitus. Unchanged cardiac silhouette and mediastinal contours with atherosclerotic plaque within the thoracic aorta. Loop recorder device overlies the left cardiac apex. Pulmonary vasculature appears less distinct on the present examination with cephalization flow. Trace bilateral effusions are not excluded. No discrete focal airspace opacities. No pneumothorax. No acute osseous abnormalities.  IMPRESSION: Findings suggestive of mild pulmonary edema with possible trace bilateral effusions. Electronically Signed   By: Simonne Come M.D.   On: 03/18/2023 17:06   CT ANGIO HEAD NECK W WO CM (CODE STROKE)  Result Date: 03/18/2023 CLINICAL DATA:  Neuro deficit, acute, stroke suspected. EXAM: CT ANGIOGRAPHY HEAD AND NECK WITH AND WITHOUT CONTRAST TECHNIQUE: Multidetector CT imaging of the head and neck was performed using the standard protocol during bolus administration of intravenous contrast. Multiplanar CT image reconstructions and MIPs were obtained to evaluate the vascular anatomy. Carotid stenosis measurements (when applicable) are obtained utilizing NASCET criteria, using the distal internal carotid diameter as the denominator. RADIATION DOSE REDUCTION: This exam was performed according to the departmental dose-optimization program which includes automated exposure control, adjustment of the mA and/or kV according to patient size and/or use of iterative reconstruction technique. CONTRAST:  60mL OMNIPAQUE IOHEXOL 350 MG/ML SOLN COMPARISON:  CT head without contrast 03/18/2023. MR angio head 03/05/2023 FINDINGS: CTA NECK FINDINGS Aortic arch: A 3 vessel arch configuration is present. No significant atherosclerotic change or stenosis is present at the arch or great vessel origins. Right carotid system: The right common carotid artery is within normal limits. The bifurcation is unremarkable. The cervical right ICA is normal. Left carotid system: The left common carotid artery is within normal limits. Bifurcation is unremarkable. The cervical left ICA is normal. Vertebral arteries: The vertebral arteries are codominant. Both vertebral arteries originate from the subclavian arteries without significant stenosis. Significant stenosis is present in either vertebral artery in the neck. Skeleton: Degenerative changes are present scratched at degenerative disc disease present lower cervical spine endplate changes  at C5-6, C6-7 and C7-T1. Reversal of the normal cervical lordosis is present through these levels. No focal osseous lesions are present. Other neck: Soft tissues the neck are otherwise unremarkable. Salivary glands are within normal limits. Thyroid is normal. No significant adenopathy is present. No focal mucosal or submucosal lesions are present. Upper chest: Paraseptal emphysematous changes and fibrosis  are noted bilaterally. No superimposed airspace disease is present. The thoracic inlet is within normal limits. Review of the MIP images confirms the above findings CTA HEAD FINDINGS Anterior circulation: Minimal atherosclerotic calcifications are present within the cavernous internal carotid arteries bilaterally without significant stenosis through the ICA termini. The A1 and M1 segments are normal. The anterior communicating artery is patent. MCA bifurcations are normal bilaterally. The ACA and MCA branch vessels are. No aneurysm is present. Posterior circulation: Right PICA origin is visualized and. 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 Basilar artery is. Both superior cerebellar arteries are patent. The posterior cerebral arteries originate from the basilar tip bilaterally. The PCA branch vessels are normal bilaterally. Venous sinuses: The dural sinuses are patent. The straight sinus and deep cerebral veins are intact. Cortical veins are within normal limits. No significant vascular malformation is evident. Anatomic variants: None Review of the MIP images confirms the above findings IMPRESSION: 1. Minimal atherosclerotic calcifications within the cavernous internal carotid arteries bilaterally without significant stenosis. 2. Otherwise normal CTA Circle of Willis without significant proximal stenosis, aneurysm, or branch vessel occlusion. 3. Normal CT angio of the chest.  No extracranial stenosis. 4. Degenerative changes of the lower cervical spine. 5.  Emphysema  (ICD10-J43.9). The above was relayed via text pager to Dr. Ritta Slot on 03/18/2023 at 13:59. Electronically Signed   By: Marin Roberts M.D.   On: 03/18/2023 14:07   CT HEAD CODE STROKE WO CONTRAST  Result Date: 03/18/2023 CLINICAL DATA:  Code stroke. Neuro deficit, acute, stroke suspected. EXAM: CT HEAD WITHOUT CONTRAST TECHNIQUE: Contiguous axial images were obtained from the base of the skull through the vertex without intravenous contrast. RADIATION DOSE REDUCTION: This exam was performed according to the departmental dose-optimization program which includes automated exposure control, adjustment of the mA and/or kV according to patient size and/or use of iterative reconstruction technique. COMPARISON:  MR head without contrast 03/04/2023 FINDINGS: Brain: Remote watershed territory infarcts are again seen bilaterally. Remote infarcts are present in the thalami bilaterally. No acute infarct, hemorrhage, or mass lesion is present. The ventricles are of normal size. No significant extraaxial fluid collection is present. The brainstem and cerebellum are within normal limits. That remote lacunar infarcts are present in the cerebellum bilaterally. No acute infarct present in the brainstem or cerebellum. Midline structures are within normal limits. Vascular: Atherosclerotic calcifications are present within the a cavernous internal carotid arteries. No hyperdense vessel is present. Skull: Calvarium is intact. No focal lytic or blastic lesions are present. No significant extracranial soft tissue lesion is present. Sinuses/Orbits: The paranasal sinuses and mastoid air cells are clear. The globes and orbits are within normal limits. ASPECTS [facility] Stroke Program Early CT Score) - Ganglionic level infarction (caudate, lentiform nuclei, internal capsule, insula, M1-M3 cortex): 7/7 - Supraganglionic infarction (M4-M6 cortex): 3/3 Total score (0-10 with 10 being normal): 10/10 IMPRESSION: 1. No acute  intracranial abnormality or significant interval change. 2. Remote watershed territory infarcts bilaterally. 3. Remote infarcts in the thalami bilaterally and cerebellum bilaterally. 4. Aspects is 10/10. The above was relayed via text pager to Dr. Ritta Slot on 03/18/2023 at 13:59 . Electronically Signed   By: Marin Roberts M.D.   On: 03/18/2023 14:00    Medications: Scheduled:  aspirin EC  81 mg Oral Daily   clopidogrel  75 mg Oral Daily   cyanocobalamin  1,000 mcg Oral Daily   heparin  5,000 Units Subcutaneous Q8H   insulin aspart  0-5 Units Subcutaneous QHS  insulin aspart  0-9 Units Subcutaneous TID WC   naphazoline-glycerin  1-2 drop Both Eyes TID WC   pantoprazole  40 mg Oral Daily   rosuvastatin  40 mg Oral QHS   senna-docusate  1 tablet Oral QHS   sertraline  50 mg Oral Daily   sodium chloride flush  3 mL Intravenous Q12H   thiamine  100 mg Oral Daily   traZODone  100 mg Oral QHS   venlafaxine XR  150 mg Oral Q breakfast    Assessment/Plan: 67 year old female admitted with syncopal episode followed by foal right sided symptoms.  Today patient is lethargic but oriented.  BP low while head up in bed.  No focal findings on neurological examination.  With low BP (SBP<100 in both arms) unable to perform orthostatics but this may explain her syncopal episode.  EEG reviewed and unremarkable.    Recommendations: Lower HOB Fluid bolus if able to tolerate Continue to follow vitals   LOS: 2 days   Thana Farr, MD Neurology  03/20/2023  12:38 PM

## 2023-03-21 DIAGNOSIS — I6310 Cerebral infarction due to embolism of unspecified precerebral artery: Secondary | ICD-10-CM | POA: Diagnosis not present

## 2023-03-21 DIAGNOSIS — I10 Essential (primary) hypertension: Secondary | ICD-10-CM | POA: Diagnosis not present

## 2023-03-21 DIAGNOSIS — I639 Cerebral infarction, unspecified: Secondary | ICD-10-CM | POA: Diagnosis not present

## 2023-03-21 DIAGNOSIS — R55 Syncope and collapse: Secondary | ICD-10-CM | POA: Diagnosis not present

## 2023-03-21 DIAGNOSIS — E1165 Type 2 diabetes mellitus with hyperglycemia: Secondary | ICD-10-CM | POA: Diagnosis not present

## 2023-03-21 DIAGNOSIS — K219 Gastro-esophageal reflux disease without esophagitis: Secondary | ICD-10-CM

## 2023-03-21 LAB — BLOOD GAS, VENOUS
Bicarbonate: 22.6 mmol/L — ABNORMAL HIGH (ref 20.0–28.0)
Patient temperature: 37 mmol/L — ABNORMAL HIGH (ref 0.0–2.0)
pCO2, Ven: 47 mm[Hg] (ref 44–60)
pH, Ven: 7.29 (ref 7.25–7.43)
pO2, Ven: 22.6 mmol/L (ref 32–45)

## 2023-03-21 LAB — GLUCOSE, CAPILLARY
Glucose-Capillary: 107 mg/dL — ABNORMAL HIGH (ref 70–99)
Glucose-Capillary: 135 mg/dL — ABNORMAL HIGH (ref 70–99)
Glucose-Capillary: 169 mg/dL — ABNORMAL HIGH (ref 70–99)

## 2023-03-21 MED ORDER — LORATADINE 10 MG PO TABS: 10.0000 mg | ORAL_TABLET | Freq: Every day | ORAL | 0 refills | Status: DC | PRN

## 2023-03-21 MED ORDER — LORATADINE 10 MG PO TABS
10.0000 mg | ORAL_TABLET | Freq: Every day | ORAL | Status: DC
  Administered 2023-03-21: 10 mg via ORAL
  Filled 2023-03-21: qty 1

## 2023-03-21 MED ORDER — CLOPIDOGREL BISULFATE 75 MG PO TABS: 75.0000 mg | ORAL_TABLET | Freq: Every day | ORAL | 0 refills | Status: AC

## 2023-03-21 NOTE — Plan of Care (Signed)
  Problem: Education: Goal: Knowledge of disease or condition will improve Outcome: Progressing   Problem: Coping: Goal: Will verbalize positive feelings about self Outcome: Progressing Goal: Will identify appropriate support needs Outcome: Progressing   Problem: Health Behavior/Discharge Planning: Goal: Goals will be collaboratively established with patient/family Outcome: Progressing   Problem: Self-Care: Goal: Ability to participate in self-care as condition permits will improve Outcome: Progressing
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
## 2023-03-21 NOTE — Progress Notes (Signed)
NEUROLOGY CONSULT FOLLOW UP NOTE   Date of service: March 21, 2023 Patient Name: LACEY VENDER MRN:  [id number] DOB:  1955/12/15  Brief HPI  COLENA SCHEELE is a 67 y.o. 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 who presented with a syncopal episode followed by right sided symptoms.     Interval Hx/subjective   Reports still feeling uncomfortable at times when she gets up.   Vitals   Vitals:   03/21/23 0832 03/21/23 0917 03/21/23 0919 03/21/23 0921  BP: (!) 175/86 (!) 154/74 (!) 148/79 136/80  Pulse: 62 64 69 75  Resp:      Temp: (!) 97.5 F (36.4 C)     TempSrc:      SpO2: 100%     Weight:         Body mass index is 29.14 kg/m.  Physical Exam   Constitutional: Appears well-developed and well-nourished.  Psych: Affect appropriate to situation.  Eyes: No scleral injection.  Head: Normocephalic.   Neurologic Examination   Mental Status: Alert, oriented, thought content appropriate.  Speech fluent without evidence of aphasia.  Able to follow 3 step commands without difficulty. Cranial Nerves: II: Discs flat bilaterally; Visual fields grossly normal, pupils equal, round, reactive to light and accommodation III,IV, VI: ptosis not present, extra-ocular motions intact bilaterally V,VII: smile symmetric, facial light touch sensation normal bilaterally VIII: hearing normal bilaterally IX,X: gag reflex present XI: bilateral shoulder shrug XII: midline tongue extension Motor: 5/5 throughout Sensory: Pinprick and light touch intact throughout, bilaterally  Labs and  Diagnostic Imaging   CBC:  Recent Labs  Lab 03/18/23 1400 03/19/23 0438  WBC 4.4 3.7*  NEUTROABS 3.2  --   HGB 10.6* 10.5*  HCT 29.8* 28.8*  MCV 89.8 88.6  PLT 133* 147*    Basic Metabolic Panel:  Lab Results  Component Value Date   NA 139 03/19/2023   K 4.0 03/19/2023   CO2 20 (L) 03/19/2023   GLUCOSE 198 (H) 03/19/2023   BUN 24 (H) 03/19/2023   CREATININE 1.27 (H) 03/19/2023   CALCIUM 7.9 (L) 03/19/2023   GFRNONAA 46 (L) 03/19/2023   GFRAA 42 (L) 11/30/2020   Lipid Panel:  Lab Results  Component Value Date   LDLCALC 86 03/05/2023   HgbA1c:  Lab Results  Component Value Date   HGBA1C 6.9 (H) 03/05/2023   Urine Drug Screen:     Component Value Date/Time   LABOPIA NONE DETECTED 03/18/2023 1635   LABOPIA NONE DETECTED 06/26/2020 0643   COCAINSCRNUR NONE DETECTED 03/18/2023 1635   LABBENZ NONE DETECTED 03/18/2023 1635   LABBENZ NONE DETECTED 06/26/2020 0643   AMPHETMU NONE DETECTED 03/18/2023 1635   AMPHETMU NONE DETECTED 06/26/2020 0643   THCU NONE DETECTED 03/18/2023 1635   THCU NONE DETECTED 06/26/2020 0643   LABBARB NONE DETECTED 03/18/2023 1635   LABBARB NONE DETECTED 06/26/2020 0643    Alcohol Level     Component Value Date/Time   ETH <10 03/18/2023 1400   INR  Lab Results  Component Value Date   INR 1.2 03/18/2023   APTT  Lab Results  Component Value Date   APTT 29 03/18/2023   AED levels: No results found for: "PHENYTOIN", "ZONISAMIDE", "LAMOTRIGINE", "LEVETIRACETA"  EEG:   Intermittent slow, left temporal region   Impression   VICKYE GOLEN is a 67 y.o. female  has a past medical history of Arthritis, Asthma, COPD (chronic obstructive pulmonary disease) (HCC), Depression, Diabetes mellitus, Embolic stroke (HCC) (11/03/2020), GERD (gastroesophageal reflux disease), Headache(784.0), Hypertension, Loop Biotronik loop implant 12/16/2020 (12/16/2020), Mental disorder, Neuropathy, Pain, Pulmonary fibrosis, unspecified (HCC), Rectal cancer (HCC),  Rectal polyp, Stroke (HCC) (11/03/2020), and Suicide attempt [facility]) who presented with a syncopal episode followed by right sided symptoms.  Currently at baseline.  MRI of the brain reveals a new left thalamic infarct which likely explains findings on EEG of intermittent left slowing. No epileptiform activity noted.  Concern is for hypoperfusion being the etiology.  Patient is orthostatic per systolic BP today.    Recommendations  Continue DAPT Continue Crestor Compression hose to be worn daily.  Daily BP checks as well with patient to contact PCP if low BP encountered Follow up with neurology on an outpatient basis ______________________________________________________________________   Thank you for the opportunity to take part in the care of this patient. If you have any further questions, please contact the neurology consultation team on call. Updated oncall schedule is listed on AMION.  Signed,  Ramy Greth

## 2023-03-21 NOTE — Plan of Care (Signed)
Problem: Education: Goal: Knowledge of disease or condition will improve Outcome: Adequate for Discharge Goal: Knowledge of secondary prevention will improve (MUST DOCUMENT ALL) Outcome: Adequate for Discharge Goal: Knowledge of patient specific risk factors will improve Loraine Leriche N/A or DELETE if not current risk factor) Outcome: Adequate for Discharge   Problem: Ischemic Stroke/TIA Tissue Perfusion: Goal: Complications of ischemic stroke/TIA will be minimized Outcome: Adequate for Discharge   Problem: Coping: Goal: Will verbalize positive feelings about self Outcome: Adequate for Discharge Goal: Will identify appropriate support needs Outcome: Adequate for Discharge   Problem: Health Behavior/Discharge Planning: Goal: Ability to manage health-related needs will improve Outcome: Adequate for Discharge Goal: Goals will be collaboratively established with patient/family Outcome: Adequate for Discharge   Problem: Self-Care: Goal: Ability to participate in self-care as condition permits will improve Outcome: Adequate for Discharge Goal: Verbalization of feelings and concerns over difficulty with self-care will improve Outcome: Adequate for Discharge Goal: Ability to communicate needs accurately will improve Outcome: Adequate for Discharge   Problem: Nutrition: Goal: Risk of aspiration will decrease Outcome: Adequate for Discharge Goal: Dietary intake will improve Outcome: Adequate for Discharge   Problem: Education: Goal: Knowledge of condition and prescribed therapy will improve Outcome: Adequate for Discharge   Problem: Cardiac: Goal: Will achieve and/or maintain adequate cardiac output Outcome: Adequate for Discharge   Problem: Physical Regulation: Goal: Complications related to the disease process, condition or treatment will be avoided or minimized Outcome: Adequate for Discharge   Problem: Education: Goal: Ability to describe self-care measures that may prevent or  decrease complications (Diabetes Survival Skills Education) will improve Outcome: Adequate for Discharge Goal: Individualized Educational Video(s) Outcome: Adequate for Discharge   Problem: Coping: Goal: Ability to adjust to condition or change in health will improve Outcome: Adequate for Discharge   Problem: Fluid Volume: Goal: Ability to maintain a balanced intake and output will improve Outcome: Adequate for Discharge   Problem: Health Behavior/Discharge Planning: Goal: Ability to identify and utilize available resources and services will improve Outcome: Adequate for Discharge Goal: Ability to manage health-related needs will improve Outcome: Adequate for Discharge   Problem: Metabolic: Goal: Ability to maintain appropriate glucose levels will improve Outcome: Adequate for Discharge   Problem: Nutritional: Goal: Maintenance of adequate nutrition will improve Outcome: Adequate for Discharge Goal: Progress toward achieving an optimal weight will improve Outcome: Adequate for Discharge   Problem: Skin Integrity: Goal: Risk for impaired skin integrity will decrease Outcome: Adequate for Discharge   Problem: Tissue Perfusion: Goal: Adequacy of tissue perfusion will improve Outcome: Adequate for Discharge   Problem: Education: Goal: Knowledge of General Education information will improve Description: Including pain rating scale, medication(s)/side effects and non-pharmacologic comfort measures Outcome: Adequate for Discharge   Problem: Health Behavior/Discharge Planning: Goal: Ability to manage health-related needs will improve Outcome: Adequate for Discharge   Problem: Clinical Measurements: Goal: Ability to maintain clinical measurements within normal limits will improve Outcome: Adequate for Discharge Goal: Will remain free from infection Outcome: Adequate for Discharge Goal: Diagnostic test results will improve Outcome: Adequate for Discharge Goal: Respiratory  complications will improve Outcome: Adequate for Discharge Goal: Cardiovascular complication will be avoided Outcome: Adequate for Discharge   Problem: Activity: Goal: Risk for activity intolerance will decrease Outcome: Adequate for Discharge   Problem: Nutrition: Goal: Adequate nutrition will be maintained Outcome: Adequate for Discharge   Problem: Coping: Goal: Level of anxiety will decrease Outcome: Adequate for Discharge   Problem: Elimination: Goal: Will not experience complications related to bowel motility Outcome:  Adequate for Discharge Goal: Will not experience complications related to urinary retention Outcome: Adequate for Discharge   Problem: Pain Managment: Goal: General experience of comfort will improve Outcome: Adequate for Discharge   Problem: Safety: Goal: Ability to remain free from injury will improve Outcome: Adequate for Discharge   Problem: Skin Integrity: Goal: Risk for impaired skin integrity will decrease Outcome: Adequate for Discharge

## 2023-03-21 NOTE — TOC Transition Note (Signed)
Transition of Care [facility]) - CM/SW Discharge Note   Patient Details  Name: Erica [name] MRN: [id number] Date of Birth: [birth year]  Transition of Care [facility]) CM/SW Contact:  Margarito Liner, LCSW Phone Number: 03/21/2023, 1:43 PM   Clinical Narrative:  Patient has orders to discharge home today. CIR set her up with [facility] when she discharged last week. Liaison is aware that she is discharging today. Patient received a RW in 2022 so unable to order a new one. Patient and family are aware. No further concerns. CSW signing off.   Final next level of care: Home w Home Health Services Barriers to Discharge: No Barriers Identified   Patient Goals and CMS Choice   Choice offered to / list presented to : Patient  Discharge Placement                  Patient to be transferred to facility by: Sister and niece Name of family member notified: [name] Patient and family notified of of transfer: 03/21/23  Discharge Plan and Services Additional resources added to the After Visit Summary for                            [facility] Arranged: PT, OT [facility] Agency: [facility] Date [facility] Agency Contacted: 03/21/23   Representative spoke with at [facility] Agency: Lorenza Chick  Social Determinants of Health (SDOH) Interventions SDOH Screenings   Food Insecurity: No Food Insecurity (03/19/2023)  Recent Concern: Food Insecurity - Food Insecurity Present (02/01/2023)  Housing: Low Risk  (03/19/2023)  Recent Concern: Housing - High Risk (02/01/2023)  Transportation Needs: No Transportation Needs (03/19/2023)  Recent Concern: Transportation Needs - Unmet Transportation Needs (02/01/2023)  Utilities: Not At Risk (03/19/2023)  Recent Concern: Utilities - At Risk (02/01/2023)  Depression (PHQ2-9): Low Risk  (04/08/2022)  Social Connections: Unknown (07/25/2022)   Received from [facility]  Tobacco Use: Medium Risk (03/18/2023)     Readmission Risk Interventions    03/07/2023   10:54  AM  Readmission Risk Prevention Plan  Transportation Screening Complete  HRI or Home Care Consult Complete  Social Work Consult for Recovery Care Planning/Counseling Complete  Palliative Care Screening Complete  Medication Review [employer]) Complete

## 2023-03-22 ENCOUNTER — Telehealth: Payer: Self-pay

## 2023-03-22 NOTE — Telephone Encounter (Signed)
Alert received from [employer]:  Episode details received Type: Asystole (recorded on Mar 21, 2023, 7:17:20 PM)  Attempted outreach to Pt's home phone.   Unable to reach Pt with this number. Left detailed message on Pt's son phone number requesting call back. Sent MyChart message.  Spoke with Pt's sister.  Pt is living with sister.  Per sister/Pt-Pt was awake at this time.  No s/s of syncope/dizziness.  Will review of EP physician.  Pt with recent hospitalization for syncope.

## 2023-03-22 NOTE — Telephone Encounter (Signed)
Discussed with [employer] representative and Dr. Elberta Fortis.  Per review-this is not a real episode.  Programming changes are needed.  Pt has follow up scheduled next month with Dr. Odis Hollingshead.  [employer] rep will be here to make suggested programming change.  Attempted to contact Pt-unable to reach Pt or leave message. Left detailed message for sister advising no action needed at this time.  Await further needs.

## 2023-03-27 ENCOUNTER — Encounter: Payer: Medicare HMO | Attending: Registered Nurse | Admitting: Registered Nurse

## 2023-03-27 DIAGNOSIS — I69398 Other sequelae of cerebral infarction: Secondary | ICD-10-CM | POA: Insufficient documentation

## 2023-03-27 DIAGNOSIS — R269 Unspecified abnormalities of gait and mobility: Secondary | ICD-10-CM | POA: Insufficient documentation

## 2023-03-31 ENCOUNTER — Encounter: Payer: Self-pay | Admitting: Physical Medicine & Rehabilitation

## 2023-03-31 ENCOUNTER — Ambulatory Visit (INDEPENDENT_AMBULATORY_CARE_PROVIDER_SITE_OTHER): Payer: Medicare HMO

## 2023-03-31 ENCOUNTER — Encounter (HOSPITAL_BASED_OUTPATIENT_CLINIC_OR_DEPARTMENT_OTHER): Payer: Medicare HMO | Admitting: Physical Medicine & Rehabilitation

## 2023-03-31 ENCOUNTER — Ambulatory Visit: Payer: Self-pay | Admitting: Cardiology

## 2023-03-31 VITALS — BP 146/78 | HR 70 | Ht 63.0 in | Wt 165.0 lb

## 2023-03-31 DIAGNOSIS — R269 Unspecified abnormalities of gait and mobility: Secondary | ICD-10-CM | POA: Diagnosis present

## 2023-03-31 DIAGNOSIS — I69398 Other sequelae of cerebral infarction: Secondary | ICD-10-CM | POA: Diagnosis present

## 2023-03-31 DIAGNOSIS — Z8673 Personal history of transient ischemic attack (TIA), and cerebral infarction without residual deficits: Secondary | ICD-10-CM

## 2023-03-31 DIAGNOSIS — Z4509 Encounter for adjustment and management of other cardiac device: Secondary | ICD-10-CM

## 2023-03-31 LAB — CUP PACEART REMOTE DEVICE CHECK
Date Time Interrogation Session: 20241018070256
Implantable Pulse Generator Implant Date: 20220706
Pulse Gen Model: 436066
Pulse Gen Serial Number: 94069553

## 2023-03-31 NOTE — Patient Instructions (Signed)
Diclofenac Gel What is this medication? DICLOFENAC (dye KLOE fen ak) treats joint pain caused by arthritis. It works by decreasing inflammation. It belongs to a group of medications called NSAIDs. This medicine may be used for other purposes; ask your health care provider or pharmacist if you have questions. COMMON BRAND NAME(S): Aspercreme Arthritis Pain Reliever, Diclofono, DICLOPREP, DSG Pak, Motrin Arthritis Pain, Omeca, Solaravix, Solaraze, ValcoPrep-100, VennGel One, Voltaren Arthritis, Voltaren Gel What should I tell my care team before I take this medication? They need to know if you have any of these conditions: Bleeding disorder Coronary artery bypass graft (CABG) within the past 2 weeks Frequently drink alcohol Heart attack Heart disease Heart failure High blood pressure Kidney disease Large areas of burned or damaged skin Liver disease Low red blood cell levels Lung or breathing disease, such as asthma Receiving steroids like dexamethasone or prednisone Skin conditions or sensitivity Stomach bleeding Stomach or intestine problems Take medications that treat or prevent blood clots Tobacco use An unusual or allergic reaction to diclofenac, other medications, foods, dyes, or preservatives Pregnant or trying to get pregnant Breastfeeding How should I use this medication? This medication is for external use only. Do not take by mouth. Wash your hands before and after use. If you are treating your hands, only wash your hands before use. Do not get it in your eyes. If you do, rinse your eyes with plenty of cool tap water. Use it as directed on the prescription label at the same time every day. Do not use it more often than directed. Keep taking it unless your care team tells you to stop. Apply a thin film of the medication to the affected area. If you are using Voltaren or Venngel One, they come with INSTRUCTIONS FOR USE. Ask your pharmacist for directions on how to use this  medication. Read the information carefully. Talk to your pharmacist or care team if you have questions. A special MedGuide will be given to you by the pharmacist with each prescription and refill. Be sure to read this information carefully each time. Talk to your care team about the use of this medication in children. Special care may be needed. Overdosage: If you think you have taken too much of this medicine contact a poison control center or emergency room at once. NOTE: This medicine is only for you. Do not share this medicine with others. What if I miss a dose? If you are using Voltaren or Venngel One: If you miss a dose, skip it. Use your next dose at the normal time. Do not use extra or 2 doses at the same time to make up for the missed dose. If you are using Solaraze: If you miss a dose, use it as soon as you can. If it is almost time for your next dose, use only that dose. Do not use double or extra doses. What may interact with this medication? Aspirin NSAIDs, medications for pain and inflammation, like ibuprofen or naproxen This list may not describe all possible interactions. Give your health care provider a list of all the medicines, herbs, non-prescription drugs, or dietary supplements you use. Also tell them if you smoke, drink alcohol, or use illegal drugs. Some items may interact with your medicine. What should I watch for while using this medication? Visit your care team for regular checks on your progress. Tell your care team if your symptoms do not start to get better or if they get worse. Do not take other medications that  contain aspirin, ibuprofen, or naproxen with this medication. Side effects such as stomach upset, nausea, or ulcers may be more likely to occur. Many non-prescription medications contain aspirin, ibuprofen, or naproxen. Always read labels carefully. If you have questions, ask your care team. This medication can cause serious ulcers and bleeding in the stomach.  It can happen with no warning. Tobacco, alcohol, older age, and poor health can also increase risks. Call your care team right away if you have stomach pain or blood in your vomit or stool. This medication does not prevent a heart attack or stroke. This medication may increase the chance of a heart attack or stroke. The chance may increase the longer you use this medication or if you have heart disease. If you take aspirin to prevent a heart attack or stroke, talk to your care team about using this medication. This medication may cause serious skin reactions. They can happen weeks to months after starting the medication. Contact your care team right away if you notice fevers or flu-like symptoms with a rash. The rash may be red or purple and then turn into blisters or peeling of the skin. You may also notice a red rash with swelling of the face, lips, or lymph nodes in your neck or under your arms. Talk to your care team if you wish to become pregnant or think you might be pregnant. This medication can cause serious birth defects if taken during pregnancy. This medication may affect your coordination, reaction time, or judgment. Do not drive or operate machinery until you know how this medication affects you. Sit up or stand slowly to reduce the risk of dizzy or fainting spells. Drinking alcohol with this medication can increase the risk of these side effects. Be careful brushing or flossing your teeth or using a toothpick because you may get an infection or bleed more easily. If you have any dental work done, tell your dentist you are receiving this medication. This medication may make it more difficult to get pregnant. Talk to your care team if you are concerned about your fertility. What side effects may I notice from receiving this medication? Side effects that you should report to your care team as soon as possible: Allergic reactions--skin rash, itching, hives, swelling of the face, lips, tongue, or  throat Bleeding--bloody or black, tar-like stools, vomiting blood or brown material that looks like coffee grounds, red or dark brown urine, small red or purple spots on skin, unusual bruising or bleeding Heart attack--pain or tightness in the chest, shoulders, arms, or jaw, nausea, shortness of breath, cold or clammy skin, feeling faint or lightheaded Heart failure--shortness of breath, swelling of ankles, feet, or hands, sudden weight gain, unusual weakness or fatigue Increase in blood pressure Kidney injury--decrease in the amount of urine, swelling of the ankles, hands, or feet Liver injury--right upper belly pain, loss of appetite, nausea, light-colored stool, dark yellow or brown urine, yellowing skin or eyes, unusual weakness or fatigue Rash, fever, and swollen lymph nodes Redness, blistering, peeling, or loosening of the skin, including inside the mouth Stroke--sudden numbness or weakness of the face, arm, or leg, trouble speaking, confusion, trouble walking, loss of balance or coordination, dizziness, severe headache, change in vision Side effects that usually do not require medical attention (report to your care team if they continue or are bothersome): Headache Irritation at application site Loss of appetite Nausea Upset stomach This list may not describe all possible side effects. Call your doctor for medical advice about  side effects. You may report side effects to FDA at 1-800-FDA-1088. Where should I keep my medication? Keep out of the reach of children and pets. Store at room temperature between 15 and 30 degrees C (59 and 86 degrees F). Do not freeze. Protect from heat. Get rid of any unused medication after the expiration date. To get rid of medications that are no longer needed or have expired: Take the medication to a medication take-back program. Check with your pharmacy or law enforcement to find a location. If you cannot return the medication, check the label or package  insert to see if the medication should be thrown out in the garbage or flushed down the toilet. If you are not sure, ask your care team. If it is safe to put it in the trash, empty the medication out of the container. Mix the medication with cat litter, dirt, coffee grounds, or other unwanted substance. Seal the mixture in a bag or container. Put it in the trash. NOTE: This sheet is a summary. It may not cover all possible information. If you have questions about this medicine, talk to your doctor, pharmacist, or health care provider.  2024 Elsevier/Gold Standard (2022-02-24 00:00:00)

## 2023-03-31 NOTE — Progress Notes (Signed)
Subjective:    Patient ID: Erica [name], female    DOB: 01-11-1956, 67 y.o.   MRN: [id number]  67 y.o. female with history of HTN, T2DM, depression, prior CVA with left sided residual deficits, pulmonary fibrosis, COPD who was admitted to [facility] on 03/04/23 with mental status changes and difficulty rousing from sleep. She was found to have small acute to subacute infarcts in bilateral centrum ovale which were felt to be due to small vessel disease. Neurology recommended DAPT X 21 days followed by ASA as patient already has loop recorder. Therapy was working with patient who was limited by ataxia, fatigue, and LLE weakness. CIR was recommended due to functional decline.   Admit date: 03/08/2023 Discharge date: 03/15/2023 HPI 67 year old female with prior right caudate and right anterior lateral thalamic infarct in January 2022 who was on the rehab unit for about 2 weeks.  The patient had another stroke in May 2022 at the right parietal occipital junction.  The patient's suffered another stroke in September 2024 with small acute to subacute infarcts in bilateral central and semiovale.  Small vessel disease was suspected as underlying etiology. ED visit for syncope, felt to be due to orthostatic hypotension  Admitted 10/5-10/8/24 for orthostatic hypotension , seen by neuro . There is no new findings seen on the CT scan.  But a small left thalamic infarct that appeared acute was noted on her MRI.  She was placed on dual antiplatelet agents again for 3 weeks  Tox screen neg  Had a phone visit with PCP Dr Concepcion Elk  Pain Inventory Average Pain 7 Pain Right Now 0 My pain is tingling  LOCATION OF PAIN  neck, shoulder, fingers  BOWEL Number of stools per week: 6   BLADDER Pads    Mobility walk without assistance use a walker ability to climb steps?  no do you drive?  yes  Function Do you have any goals in this area?  no  Neuro/Psych No problems in this area  Prior Studies Any changes  since last visit?  no  Physicians involved in your care Any changes since last visit?  no   Family History  Problem Relation Age of Onset   Hypertension Father    Stroke Father    Diabetes Father    Heart disease Mother    Hypertension Mother    Hyperlipidemia Mother    Hypertension Sister    Hypertension Brother    Hypertension Sister    Hypertension Brother    Cancer Other 52       colon cancer    Cancer Maternal Aunt        cancer, unknown type    Cancer Maternal Uncle        bone cancer    Cancer Paternal Aunt        lung cancer   Cancer Paternal Uncle        lung cancer   Cancer Maternal Uncle        colon cancer and prostate cancer    Cancer Maternal Uncle        prostate cancer    Social History   Socioeconomic History   Marital status: Single    Spouse name: Not on file   Number of children: 1   Years of education: Not on file   Highest education level: Not on file  Occupational History   Not on file  Tobacco Use   Smoking status: Former    Current packs/day: 0.00  Average packs/day: 0.5 packs/day for 20.0 years (10.0 ttl pk-yrs)    Types: Cigarettes    Start date: 01/05/1998    Quit date: 01/05/2018    Years since quitting: 5.2   Smokeless tobacco: Never   Tobacco comments:    Unknown   Vaping Use   Vaping status: Never Used  Substance and Sexual Activity   Alcohol use: Not Currently   Drug use: Not Currently    Types: "Crack" cocaine    Comment: Unknown    Sexual activity: Not Currently    Birth control/protection: None  Other Topics Concern   Not on file  Social History Narrative   12/30/20 lives with others   Social Determinants of Health   Financial Resource Strain: Not on file  Food Insecurity: No Food Insecurity (03/19/2023)   Hunger Vital Sign    Worried About Running Out of Food in the Last Year: Never true    Ran Out of Food in the Last Year: Never true  Recent Concern: Food Insecurity - Food Insecurity Present (02/01/2023)    Hunger Vital Sign    Worried About Running Out of Food in the Last Year: Sometimes true    Ran Out of Food in the Last Year: Patient declined  Transportation Needs: No Transportation Needs (03/19/2023)   PRAPARE - Administrator, Civil Service (Medical): No    Lack of Transportation (Non-Medical): No  Recent Concern: Transportation Needs - Unmet Transportation Needs (02/01/2023)   PRAPARE - Transportation    Lack of Transportation (Medical): Yes    Lack of Transportation (Non-Medical): Yes  Physical Activity: Not on file  Stress: Not on file  Social Connections: Unknown (07/25/2022)   Received from [facility]   Social Network    Social Network: Not on file   Past Surgical History:  Procedure Laterality Date   ANAL RECTAL MANOMETRY N/A 07/13/2016   Procedure: ANO RECTAL MANOMETRY;  Surgeon: Romie Levee, MD;  Location: [facility];  Service: Endoscopy;  Laterality: N/A;   CESAREAN SECTION     EUS N/A 11/21/2012   Procedure: LOWER ENDOSCOPIC ULTRASOUND (EUS);  Surgeon: Willis Modena, MD;  Location: [facility];  Service: Endoscopy;  Laterality: N/A;   FLEXIBLE SIGMOIDOSCOPY N/A 11/21/2012   Procedure: FLEXIBLE SIGMOIDOSCOPY;  Surgeon: Willis Modena, MD;  Location: [facility];  Service: Endoscopy;  Laterality: N/A;   FLEXIBLE SIGMOIDOSCOPY N/A 01/28/2013   Procedure: FLEXIBLE SIGMOIDOSCOPY;  Surgeon: Romie Levee, MD;  Location: [facility];  Service: Endoscopy;  Laterality: N/A;   LAPAROSCOPIC LOW ANTERIOR RESECTION N/A 01/29/2013   Procedure: LAPAROSCOPIC LOW ANTERIOR RESECTION, Rigid Proctoscopy;  Surgeon: Romie Levee, MD;  Location: [facility];  Service: General;  Laterality: N/A;   LAPAROSCOPIC SIGMOID COLECTOMY N/A 11/14/2012   Procedure: DIAGNOSTIC LAPAROSCOPY AND SIGMOIDMOIDOSCOPY ;  Surgeon: Emelia Loron, MD;  Location: [facility];  Service: General;  Laterality: N/A;   RECTAL ULTRASOUND N/A 07/13/2016   Procedure: RECTAL ULTRASOUND;  Surgeon: Romie Levee, MD;  Location: [facility];  Service: Endoscopy;  Laterality: N/A;   TONSILLECTOMY     TONSILLECTOMY AND ADENOIDECTOMY     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   GERD (gastroesophageal reflux disease)    with chronic cough   Headache(784.0)    "mild"   Hypertension  Loop [employer] loop implant 12/16/2020 12/16/2020   Mental disorder    depression   Neuropathy    feet    Pain    arthritis pain - takes tramadol as needed   Pulmonary fibrosis, unspecified (HCC)    Rectal cancer (HCC)    Rectal polyp    very little bleeding with bowel movements- no pain   Stroke (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   Suicide attempt (HCC)    Ht 5\' 3"  (1.6 m)   Wt 165 lb (74.8 kg)   BMI 29.23 kg/m   Opioid Risk Score:   Fall Risk Score:  `1  Depression screen [facility] 2/9     04/08/2022   10:57 AM 11/26/2021    2:15 PM 04/20/2021    2:17 PM 12/11/2020    3:28 PM 12/03/2020    9:41 AM 07/29/2020    8:39 AM  Depression screen PHQ 2/9  Decreased Interest 1 1 1 2  0 3  Down, Depressed, Hopeless 1 1 1 1 1 3   PHQ - 2 Score 2 2 2 3 1 6   Altered sleeping     1 3  Tired, decreased energy     3 3  Change in appetite     2 3  Feeling bad or failure about yourself      0 3  Trouble concentrating     3 3  Moving slowly or fidgety/restless     1 3  Suicidal thoughts     0 1  PHQ-9 Score     11 25  Difficult doing work/chores     Very difficult       Review of Systems  Musculoskeletal:  Positive for gait problem and neck pain.       Shoulder, hand, and finger pain   All other systems reviewed and are negative.     Objective:   Physical Exam General No acute distress Mood and affect are appropriate Extremities without edema Motor strength is 4+/5 in the right deltoid bicep tricep grip hip flexor knee extensor ankle  dorsiflexor 4/5 in left deltoid bicep tricep grip hip flexor knee extensor ankle dorsiflexor No evidence of dysdiadochokinesis with rapid alternating supination pronation of the upper extremities although both sides appear to be slower than average. Finger-nose-finger testing showed no evidence of dysmetria. Finger thumb opposition was slow bilaterally but is able to oppose all fingers to the thumb. She ambulates with a cane she has no evidence of toe drag or knee instability her cadence is slow she has slightly widened base support Speech without dysarthria or aphasia       Assessment & Plan:  1.  Recurrent strokes.  The patient multiple cortical and subcortical infarcts of the last 2 years.  Fortunately she has not had any new major symptoms with the last 2 strokes.  Certainly her balance remains poor and at risk for falling.  Continue home health PT OT She will need follow-up with neurology Follow-up with PCP Follow-up physical medicine rehab on as-needed basis

## 2023-04-04 ENCOUNTER — Encounter: Payer: Self-pay | Admitting: *Deleted

## 2023-04-04 NOTE — Progress Notes (Signed)
Pt attended 02/01/23 screening event where her b/p was 138/82 . At the event, the pt shared her PCP was Dr. Milford Cage but she moved away from [place] and ended up staying with her sister in [place]. During initial event f/u pt asked for PCP resources to be sent to her at her sister's address. However, during 60 day event f/u call today, pt shared she had had 2 strokes since the event and was now able to move around independently  so was trying to rent an apt in [place]. She also noted her insurance helped her with transportation to her healthcare appts and sent her food supplies "once a month that last me pretty well for the month." Health equity team member offered to resend PCP info, Get Care Now, and [facility] flyers for the [place] area, as well as food pantry resources in [place] since pt is trying to move back to [place]. Pt states she is familiar with [place] resources and healthcare facility locations. Additional pt f/u to be scheduled per health equity protocol

## 2023-04-18 NOTE — Progress Notes (Signed)
Carelink Summary Report / Loop Recorder 

## 2023-04-19 ENCOUNTER — Ambulatory Visit: Payer: Medicare HMO | Attending: Cardiology | Admitting: Cardiology

## 2023-04-19 ENCOUNTER — Other Ambulatory Visit: Payer: Self-pay

## 2023-04-19 ENCOUNTER — Encounter: Payer: Self-pay | Admitting: Cardiology

## 2023-04-19 VITALS — BP 144/82 | Resp 16 | Ht 63.0 in | Wt 168.4 lb

## 2023-04-19 DIAGNOSIS — Z87891 Personal history of nicotine dependence: Secondary | ICD-10-CM

## 2023-04-19 DIAGNOSIS — N183 Chronic kidney disease, stage 3 unspecified: Secondary | ICD-10-CM

## 2023-04-19 DIAGNOSIS — I2510 Atherosclerotic heart disease of native coronary artery without angina pectoris: Secondary | ICD-10-CM | POA: Diagnosis not present

## 2023-04-19 DIAGNOSIS — N1832 Chronic kidney disease, stage 3b: Secondary | ICD-10-CM

## 2023-04-19 DIAGNOSIS — I2584 Coronary atherosclerosis due to calcified coronary lesion: Secondary | ICD-10-CM

## 2023-04-19 DIAGNOSIS — Z8673 Personal history of transient ischemic attack (TIA), and cerebral infarction without residual deficits: Secondary | ICD-10-CM

## 2023-04-19 DIAGNOSIS — I129 Hypertensive chronic kidney disease with stage 1 through stage 4 chronic kidney disease, or unspecified chronic kidney disease: Secondary | ICD-10-CM

## 2023-04-19 DIAGNOSIS — Z794 Long term (current) use of insulin: Secondary | ICD-10-CM

## 2023-04-19 DIAGNOSIS — I639 Cerebral infarction, unspecified: Secondary | ICD-10-CM

## 2023-04-19 DIAGNOSIS — E1122 Type 2 diabetes mellitus with diabetic chronic kidney disease: Secondary | ICD-10-CM | POA: Diagnosis not present

## 2023-04-19 DIAGNOSIS — E782 Mixed hyperlipidemia: Secondary | ICD-10-CM

## 2023-04-19 DIAGNOSIS — Z95818 Presence of other cardiac implants and grafts: Secondary | ICD-10-CM

## 2023-04-19 DIAGNOSIS — E781 Pure hyperglyceridemia: Secondary | ICD-10-CM

## 2023-04-19 NOTE — Patient Instructions (Signed)
Medication Instructions:  Your physician recommends that you continue on your current medications as directed. Please refer to the Current Medication list given to you today.  *If you need a refill on your cardiac medications before your next appointment, please call your pharmacy*  Lab Work: None ordered today. If you have labs (blood work) drawn today and your tests are completely normal, you will receive your results only by: MyChart Message (if you have MyChart) OR A paper copy in the mail If you have any lab test that is abnormal or we need to change your treatment, we will call you to review the results.  Testing/Procedures: None ordered today.  Follow-Up: At [facility], you and your health needs are our priority.  As part of our continuing mission to provide you with exceptional heart care, we have created designated Provider Care Teams.  These Care Teams include your primary Cardiologist (physician) and Advanced Practice Providers (APPs -  Physician Assistants and Nurse Practitioners) who all work together to provide you with the care you need, when you need it.  Referral for hematology has been placed for you at the request of Dr. Odis Hollingshead. They will reach out to you to schedule an appointment.  Your next appointment:   6 month(s)  The format for your next appointment:   In Person  Provider:   Tessa Lerner, DO {

## 2023-04-19 NOTE — Progress Notes (Signed)
Cardiology Office Note:  .   Date:  04/19/2023  ID:  Erica [name], DOB 23-Jun-1955, MRN [id number] PCP:  Fleet Contras, MD  Former Cardiology Providers: Dr. Yates Decamp, Altamese Jayuya, APRN, FNP-C  [facility] Providers Cardiologist:  Tessa Lerner, DO , [facility] (established care June 2021) Electrophysiologist:  None  Click to update primary MD,subspecialty MD or APP then REFRESH:1}    Chief Complaint  Patient presents with   Follow-up    Status post hospitalization    History of Present Illness: .   Erica [name] is a 67 y.o. African-American female whose past medical history and cardiovascular risk factors includes: Hx of stroke involving the right caudate nucleus and right anterolateral thalamus (Jan 2022), Hx of right parietooccipital stroke (May 2022), s/p loop recorder implant, rectal CA in 2014 that is in remission, T2DM, former tobacco use, emphysema, interstitial lung disease, idiopathic pulmonary fibrosis, and coronary artery calcification on CT scan.   In 2022 patient had 2 strokes within a short period of time and was referred to cardiology for further evaluation and management.  She did undergo a loop recorder implant in July 2022 and thus far no documented episodes of atrial fibrillation.  She presents today for 68-month follow-up visit.  Since last office visit she was hospitalized on 03/18/2023 for syncopal event and right-sided numbness.  Based on discharge summary likely secondary to orthostatic hypotension.  Imaging findings of an acute punctate nonhemorrhagic infarct in left thalamus.  Clinically she denies anginal chest pain or heart failure symptoms.  She still has some residual numbness involving the left face.  Currently in speech and physical therapy.  On March 21, 2023 around 717pm the loop recorder had an alert for asystole.  Her device was interrogated today and the parameters were changed (see below).  She is accompanied by her sister today's office  visit.  Review of Systems: .   Review of Systems  Cardiovascular:  Negative for chest pain, claudication, irregular heartbeat, leg swelling, near-syncope, orthopnea, palpitations, paroxysmal nocturnal dyspnea and syncope.  Respiratory:  Negative for shortness of breath.   Hematologic/Lymphatic: Negative for bleeding problem.  Neurological:  Positive for numbness (Left side of the face).       Gait instability, walks with a cane.    Studies Reviewed:   EKG: 03/18/2023: Normal sinus rhythm, 70 bpm, without underlying ischemia injury pattern  Echocardiogram: September 2024: LVEF 65 to 70%, grade 1 diastolic dysfunction, right ventricular size and function normal. Mild aortic regurgitation Estimated RAP 3 mmHg. See report for additional details  Carotid duplex 02/2023: <50% stenosis ICA.  RADIOLOGY: MRI of the brain without contrast: 03/18/2023 1. Acute punctate nonhemorrhagic infarct of the left thalamus. 2. Previously noted subcortical infarcts of the centrum semi ovale demonstrate expected change in are only faintly seen on the fusion weighted images without definite restricted diffusion. 3. 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 4. Remote watershed territory infarcts and diffuse subcortical white matter disease is also stable. 5. Remote lacunar infarcts of the basal ganglia and cerebellum bilaterally are stable.  Risk Assessment/Calculations:   NA   Labs:       Latest Ref Rng & Units 03/19/2023    4:38 AM 03/18/2023    2:00 PM 03/13/2023    6:56 AM  CBC  WBC 4.0 - 10.5 K/uL 3.7  4.4  3.6   Hemoglobin 12.0 - 15.0 g/dL 62.1  30.8  65.7   Hematocrit 36.0 - 46.0 % 28.8  29.8  30.7   Platelets 150 - 400 K/uL 147  133  133        Latest Ref Rng & Units 03/19/2023    4:38 AM 03/18/2023    2:00 PM 03/15/2023    5:17 AM  BMP  Glucose 70 - 99 mg/dL 433  295  188   BUN 8 - 23 mg/dL 24  31  28    Creatinine 0.44 - 1.00 mg/dL 4.16  6.06  3.01    Sodium 135 - 145 mmol/L 139  141  139   Potassium 3.5 - 5.1 mmol/L 4.0  4.2  4.0   Chloride 98 - 111 mmol/L 111  112  112   CO2 22 - 32 mmol/L 20  21  18    Calcium 8.9 - 10.3 mg/dL 7.9  8.1  8.5       Latest Ref Rng & Units 03/19/2023    4:38 AM 03/18/2023    2:00 PM 03/15/2023    5:17 AM  CMP  Glucose 70 - 99 mg/dL 601  093  235   BUN 8 - 23 mg/dL 24  31  28    Creatinine 0.44 - 1.00 mg/dL 5.73  2.20  2.54   Sodium 135 - 145 mmol/L 139  141  139   Potassium 3.5 - 5.1 mmol/L 4.0  4.2  4.0   Chloride 98 - 111 mmol/L 111  112  112   CO2 22 - 32 mmol/L 20  21  18    Calcium 8.9 - 10.3 mg/dL 7.9  8.1  8.5   Total Protein 6.5 - 8.1 g/dL  6.1    Total Bilirubin 0.3 - 1.2 mg/dL  0.8    Alkaline Phos 38 - 126 U/L  110    AST 15 - 41 U/L  19    ALT 0 - 44 U/L  10      Lab Results  Component Value Date   CHOL 161 03/05/2023   HDL 52 03/05/2023   LDLCALC 86 03/05/2023   LDLDIRECT 37 03/08/2021   TRIG 115 03/05/2023   CHOLHDL 3.1 03/05/2023   No results for input(s): "LIPOA" in the last 8760 hours. No components found for: "NTPROBNP" No results for input(s): "PROBNP" in the last 8760 hours. Recent Labs    03/05/23 1523  TSH 0.916     Physical Exam:    Today's Vitals   04/19/23 1030  BP: (!) 144/82  Resp: 16  Weight: 168 lb 6.4 oz (76.4 kg)  Height: 5\' 3"  (1.6 m)   Body mass index is 29.83 kg/m. Wt Readings from Last 3 Encounters:  04/19/23 168 lb 6.4 oz (76.4 kg)  03/31/23 165 lb (74.8 kg)  03/21/23 164 lb 8 oz (74.6 kg)    Physical Exam  Constitutional: No distress.  hemodynamically stable, ambulates with a cane.  Neck: No JVD present.  Cardiovascular: Normal rate, regular rhythm, S1 normal and S2 normal. Exam reveals no gallop, no S3 and no S4.  No murmur heard. Pulmonary/Chest: Effort normal and breath sounds normal. No stridor. She has no wheezes. She has no rales.  Abdominal: Soft. Bowel sounds are normal. She exhibits no distension. There is no abdominal  tenderness.  Musculoskeletal:        General: No edema.     Cervical back: Neck supple.     Comments: Left foot drop- brace present  Neurological: She is alert and oriented to person, place, and time.  Strength 5 out of 5 in the  right upper and lower extremity, strength 4 out of 5 in the left upper and lower extremity.  Skin: Skin is warm.    Impression & Recommendation(s):  Impression:   ICD-10-CM   1. Acute CVA (cerebrovascular accident) [facility])  I63.9 Ambulatory referral to Hematology / Oncology    2. Hx of stroke involving the right caudate nucleus and right anterolateral thalamus (Jan 2022), Hx of  right parietooccipital stroke (May 2022)  Z86.73 Ambulatory referral to Hematology / Oncology    3. Coronary atherosclerosis due to calcified coronary lesion of native artery  I25.10    I25.84     4. Type 2 diabetes mellitus with stage 3b chronic kidney disease, with long-term current use of insulin (HCC)  E11.22    N18.32    Z79.4     5. Benign hypertension with CKD (chronic kidney disease) stage III (HCC)  I12.9    N18.30     6. Mixed hyperlipidemia  E78.2     7. Hypertriglyceridemia  E78.1     8. Status post placement of implantable loop recorder  Z95.818     9. Former tobacco use  Z87.891        Recommendation(s):  Acute CVA (cerebrovascular accident) (HCC) Hx of stroke involving the right caudate nucleus and right anterolateral thalamus (Jan 2022), Hx of  right parietooccipital stroke (May 2022) Recently hospitalized for syncope-underwent stroke workup. Noted to have an acute infarct in the left thalamus. She has had a prior stroke(s) as well.   Loop recorder has not shown evidence of atrial fibrillation. She has completed dual antiplatelet therapy for 21 days as recommended.  Currently on aspirin 81 mg p.o. daily. Will refer her to hematology for evaluation for hypercoagulable workup given her recurrent strokes. Reemphasized the importance of secondary prevention with  focus on improving her modifiable cardiovascular risk factors such as glycemic control, lipid management, blood pressure control, weight loss.  Coronary atherosclerosis due to calcified coronary lesion of native artery Continue antiplatelet therapy and statins. LDL currently not at goal, 86 mg/dL No anginal chest pain since last office visit  Type 2 diabetes mellitus with stage 3b chronic kidney disease, with long-term current use of insulin (HCC) Hemoglobin A1c 6.9 as of September 2024 Reemphasized importance of glycemic control. Currently on statin therapy, Ulysees Barns,. I have discussed SGLT2 inhibitors-however she has had at least 3 urinary tract infections in the past and she remains hesitant  Benign hypertension with CKD (chronic kidney disease) stage III (HCC) Office blood pressures are above goal. Will hold off on up titration of medical therapy given her history of recent syncope likely secondary to orthostasis/hypotension. I have advised her to check her blood pressures at home to see if further medication titration is warranted.  Mixed hyperlipidemia Hypertriglyceridemia Reemphasized importance of medication compliance. Recommended goal LDL <55 mg/dL In the past her lipids were as low as 37 mg/dL. Recommend increasing compliance with medical therapy and to recheck her lipids.  If LDL not are not at goal up titration of medical therapy will be warranted  Status post placement of implantable loop recorder Recently hospitalized for syncope-discharged 03/21/2023. There was an alert on her ILR10/01/2023 at  7:17pm for asystole. [employer] rep is here today to interrogate her device.  It was felt that this was likelyartifact (baseline wonder) and we have changed signal filter from 0.05hz  to 0.5hz  this should keep baseline more normalized.  Monitor for now.   Orders Placed:  Orders Placed This Encounter  Procedures  Ambulatory referral to Hematology / Oncology    Referral  Priority:   Routine    Referral Type:   Consultation    Referral Reason:   Specialty Services Required    Requested Specialty:   Hematology    Number of Visits Requested:   1    Final Medication List:   No orders of the defined types were placed in this encounter.   There are no discontinued medications.   Current Outpatient Medications:    acetaminophen (TYLENOL) 325 MG tablet, Take 2 tablets (650 mg total) by mouth 2 (two) times daily., Disp: , Rfl:    albuterol (VENTOLIN HFA) 108 (90 Base) MCG/ACT inhaler, Inhale 2 puffs into the lungs every 4 (four) hours as needed for wheezing., Disp: 1 g, Rfl: 5   amLODipine (NORVASC) 10 MG tablet, Take 1 tablet (10 mg total) by mouth daily., Disp: 30 tablet, Rfl: 0   aspirin EC 81 MG tablet, Take 1 tablet (81 mg total) by mouth daily. Swallow whole., Disp: 120 tablet, Rfl: 12   cyanocobalamin 1000 MCG tablet, Take 1 tablet (1,000 mcg total) by mouth daily for 30 days then as directed by MD, Disp: 130 tablet, Rfl: 0   diclofenac Sodium (VOLTAREN) 1 % GEL, SMARTSIG:4 Gram(s) Topical 4 Times Daily PRN, Disp: , Rfl:    gabapentin (NEURONTIN) 100 MG capsule, Take 1 capsule (100 mg total) by mouth 2 (two) times daily. Patient is taking as needed, Disp: 60 capsule, Rfl: 0   HYDROcodone-acetaminophen (NORCO/VICODIN) 5-325 MG tablet, Take 1 tablet by mouth every 6 (six) hours as needed., Disp: , Rfl:    insulin glargine-yfgn (SEMGLEE) 100 UNIT/ML injection, Inject 0.3 mLs (30 Units total) into the skin daily., Disp: , Rfl:    Insulin Pen Needle (PEN NEEDLES) 32G X 6 MM MISC, 1 application by Does not apply route 2 (two) times daily., Disp: 100 each, Rfl: 1   loratadine (CLARITIN) 10 MG tablet, Take 1 tablet (10 mg total) by mouth daily as needed for allergies., Disp: 30 tablet, Rfl: 0   naphazoline-glycerin (CLEAR EYES REDNESS) 0.012-0.25 % SOLN, Place 1-2 drops into both eyes 3 (three) times daily with meals., Disp: , Rfl:    pantoprazole (PROTONIX) 40 MG  tablet, Take 40 mg by mouth daily., Disp: , Rfl:    Pirfenidone 801 MG TABS, TAKE 1 TABLET (801 MG) BY MOUTH WITH BREAKFAST, WITH LUNCH, AND WITH EVENING MEAL. (Patient taking differently: Take 1 tablet by mouth daily. Patient is taking 1 tablet daily current with breakfast), Disp: 270 tablet, Rfl: 0   sacubitril-valsartan (ENTRESTO) 49-51 MG, Take 1 tablet by mouth 2 (two) times daily., Disp: 60 tablet, Rfl: 0   sertraline (ZOLOFT) 50 MG tablet, Take 1 tablet (50 mg total) by mouth daily., Disp: 30 tablet, Rfl: 0   SYMBICORT 160-4.5 MCG/ACT inhaler, INHALE 2 PUFFS INTO THE LUNGS TWICE DAILY, Disp: 10.2 g, Rfl: 5   traZODone (DESYREL) 100 MG tablet, Take 1 tablet (100 mg total) by mouth at bedtime., Disp: 30 tablet, Rfl: 0   venlafaxine XR (EFFEXOR-XR) 150 MG 24 hr capsule, Take 1 capsule (150 mg total) by mouth daily., Disp: 30 capsule, Rfl: 0   rosuvastatin (CRESTOR) 40 MG tablet, Take 1 tablet (40 mg total) by mouth daily., Disp: 30 tablet, Rfl: 0  Consent:   NA  Disposition:   6 months sooner if needed. Patient may be asked to follow-up sooner based on the results of the above-mentioned testing.  Her questions and concerns  were addressed to her satisfaction. She voices understanding of the recommendations provided during this encounter.    Signed, Tessa Lerner, DO, [facility]  [facility]  [address] [place], [place] [address] 04/19/2023 7:01 PM

## 2023-04-20 ENCOUNTER — Ambulatory Visit: Payer: Medicare Other | Admitting: Adult Health

## 2023-04-20 NOTE — Progress Notes (Signed)
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 41 U/L   ALT 10 0 - 44 U/L   Alkaline Phosphatase 88 38 - 126 U/L   Total Bilirubin 0.6 0.3 - 1.2 mg/dL   GFR, Estimated 35 (L) >60 mL/min    Comment: (NOTE) Calculated using the CKD-EPI Creatinine Equation (2021)    Anion gap 5 5 - 15    Comment: Performed at [facility], [address] [place], [place] [address]  CBC with Differential/Platelet     Status: Abnormal    Collection Time: 03/09/23  6:53 AM  Result Value Ref Range   WBC 4.5 4.0 - 10.5 K/uL   RBC 3.23 (L) 3.87 - 5.11 MIL/uL   Hemoglobin 10.6 (L) 12.0 - 15.0 g/dL   HCT 40.3 (L) 47.4 - 25.9 %   MCV 89.5 80.0 - 100.0 fL   MCH 32.8 26.0 - 34.0 pg   MCHC 36.7 (H) 30.0 - 36.0 g/dL   RDW 56.3 (H) 87.5 - 64.3 %   Platelets 143 (L) 150 - 400 K/uL   nRBC 0.0 0.0 - 0.2 %   Neutrophils Relative % 71 %   Neutro Abs 3.2 1.7 - 7.7 K/uL   Lymphocytes Relative 21 %   Lymphs Abs 0.9 0.7 -  4.0 K/uL   Monocytes Relative 6 %   Monocytes Absolute 0.3 0.1 - 1.0 K/uL   Eosinophils Relative 2 %   Eosinophils Absolute 0.1 0.0 - 0.5 K/uL   Basophils Relative 0 %   Basophils Absolute 0.0 0.0 - 0.1 K/uL   Immature Granulocytes 0 %   Abs Immature Granulocytes 0.02 0.00 - 0.07 K/uL    Comment: Performed at [facility], [address], [place], [place] [address]  VITAMIN D 25 Hydroxy (Vit-D Deficiency, Fractures)     Status: Abnormal   Collection Time: 03/09/23  6:53 AM  Result Value Ref Range   Vit D, 25-Hydroxy 23.78 (L) 30 - 100 ng/mL    Comment: (NOTE) Vitamin D deficiency has been defined by the Institute of Medicine  and an Endocrine Society practice guideline as a level of serum 25-OH  vitamin D less than 20 ng/mL (1,2). The Endocrine Society went on to  further define vitamin D insufficiency as a level between 21 and 29  ng/mL (2).  1. IOM (Institute of Medicine). 2010. Dietary reference intakes for  calcium and D. [place]: [employer]. 2. Holick MF, Binkley D[place], Bischoff-Ferrari HA, et al. Evaluation,  treatment, and prevention of vitamin D deficiency: an Endocrine  Society clinical practice guideline, JCEM. 2011 Jul; 96(7): 1911-30.  Performed at [facility], [address], [place], [place] [address]   Magnesium     Status: None   Collection Time: 03/09/23  6:53 AM  Result Value Ref Range   Magnesium 1.7 1.7 - 2.4 mg/dL    Comment: Performed at [facility], [address], [place], [place] [address]  Glucose, capillary     Status: Abnormal   Collection Time: 03/09/23 11:44 AM  Result Value Ref Range   Glucose-Capillary 144 (H) 70 - 99 mg/dL    Comment: Glucose reference range applies only to samples taken after fasting for at least 8 hours.  Glucose, capillary     Status: Abnormal   Collection Time: 03/09/23  5:10 PM  Result Value Ref Range   Glucose-Capillary 196 (H) 70 - 99 mg/dL    Comment: Glucose reference range applies only to samples taken after fasting for at least 8 hours.  Glucose, capillary     Status: Abnormal   Collection Time: 03/09/23  8:45 PM  Result Value Ref Range   Glucose-Capillary 240 (H) 70 - 99 mg/dL    Comment: Glucose reference range applies only to samples taken after fasting for at least 8 hours.  Glucose, capillary     Status: Abnormal   Collection Time: 03/10/23  6:26 AM  Result Value Ref Range   Glucose-Capillary 180 (H) 70 - 99 mg/dL    Comment: Glucose reference range applies only to samples taken after fasting for at least 8 hours.  Glucose, capillary     Status: Abnormal   Collection Time: 03/10/23 12:06 PM  Result Value Ref Range   Glucose-Capillary 187 (H) 70 - 99 mg/dL    Comment: Glucose reference range applies only to samples taken after fasting for at least 8 hours.  Glucose, capillary     Status: Abnormal   Collection Time: 03/10/23  4:48 PM  Result Value Ref Range   Glucose-Capillary 167 (H) 70 - 99 mg/dL    Comment: Glucose reference range applies only to samples taken after fasting for at least 8 hours.  Glucose, capillary     Status: Abnormal   Collection  Time: 03/10/23  9:05 PM  Result Value Ref Range   Glucose-Capillary 254 (H) 70 - 99 mg/dL    Comment: Glucose reference range applies only to samples taken after fasting for at least 8 hours.   Comment 1 Notify RN   Glucose, capillary     Status: Abnormal   Collection Time: 03/11/23  5:50 AM  Result Value Ref Range    Glucose-Capillary 160 (H) 70 - 99 mg/dL    Comment: Glucose reference range applies only to samples taken after fasting for at least 8 hours.   Comment 1 Notify RN   Glucose, capillary     Status: Abnormal   Collection Time: 03/11/23 12:04 PM  Result Value Ref Range   Glucose-Capillary 177 (H) 70 - 99 mg/dL    Comment: Glucose reference range applies only to samples taken after fasting for at least 8 hours.  Glucose, capillary     Status: Abnormal   Collection Time: 03/11/23  4:56 PM  Result Value Ref Range   Glucose-Capillary 181 (H) 70 - 99 mg/dL    Comment: Glucose reference range applies only to samples taken after fasting for at least 8 hours.  Glucose, capillary     Status: Abnormal   Collection Time: 03/11/23  8:58 PM  Result Value Ref Range   Glucose-Capillary 156 (H) 70 - 99 mg/dL    Comment: Glucose reference range applies only to samples taken after fasting for at least 8 hours.   Comment 1 Notify RN   Glucose, capillary     Status: Abnormal   Collection Time: 03/12/23  6:07 AM  Result Value Ref Range   Glucose-Capillary 136 (H) 70 - 99 mg/dL    Comment: Glucose reference range applies only to samples taken after fasting for at least 8 hours.   Comment 1 Notify RN   Glucose, capillary     Status: Abnormal   Collection Time: 03/12/23 11:49 AM  Result Value Ref Range   Glucose-Capillary 160 (H) 70 - 99 mg/dL    Comment: Glucose reference range applies only to samples taken after fasting for at least 8 hours.  Glucose, capillary     Status: Abnormal   Collection Time: 03/12/23  4:38 PM  Result Value Ref Range   Glucose-Capillary 194 (H) 70 - 99 mg/dL    Comment: Glucose reference range applies only to samples taken after fasting for at least 8 hours.  Glucose, capillary     Status: Abnormal   Collection Time: 03/12/23  8:56 PM  Result Value Ref Range   Glucose-Capillary 141 (H) 70 - 99 mg/dL    Comment: Glucose reference range applies only to samples taken after fasting  for at least 8 hours.   Comment 1 Notify RN   Glucose, capillary     Status: Abnormal   Collection Time: 03/13/23  5:44 AM  Result Value Ref Range   Glucose-Capillary 118 (H) 70 - 99 mg/dL    Comment: Glucose reference range applies only to samples taken after fasting for at least 8 hours.   Comment 1 Notify RN   Basic metabolic panel     Status: Abnormal   Collection Time: 03/13/23  6:56 AM  Result Value Ref Range   Sodium 140 135 - 145 mmol/L   Potassium 4.3 3.5 - 5.1 mmol/L   Chloride 114 (H) 98 - 111 mmol/L   CO2 20 (L) 22 - 32 mmol/L   Glucose, Bld 106 (H) 70 - 99 mg/dL  Comment: Glucose reference range applies only to samples taken after fasting for at least 8 hours.   BUN 28 (H) 8 - 23 mg/dL   Creatinine, Ser 0.86 (H) 0.44 - 1.00 mg/dL   Calcium 8.3 (L) 8.9 - 10.3 mg/dL   GFR, Estimated 35 (L) >60 mL/min    Comment: (NOTE) Calculated using the CKD-EPI Creatinine Equation (2021)    Anion gap 6 5 - 15    Comment: Performed at [facility], [address], [place], [place] [address]  CBC     Status: Abnormal   Collection Time: 03/13/23  6:56 AM  Result Value Ref Range   WBC 3.6 (L) 4.0 - 10.5 K/uL   RBC 3.38 (L) 3.87 - 5.11 MIL/uL   Hemoglobin 10.8 (L) 12.0 - 15.0 g/dL   HCT 96.2 (L) 95.2 - 84.1 %   MCV 90.8 80.0 - 100.0 fL   MCH 32.0 26.0 - 34.0 pg   MCHC 35.2 30.0 - 36.0 g/dL   RDW 32.4 (H) 40.1 - 02.7 %   Platelets 133 (L) 150 - 400 K/uL    Comment: REPEATED TO VERIFY   nRBC 0.0 0.0 - 0.2 %    Comment: Performed at [facility], [address], [place], [place] [address]  Glucose, capillary     Status: Abnormal   Collection Time: 03/13/23 11:59 AM  Result Value Ref Range   Glucose-Capillary 156 (H) 70 - 99 mg/dL    Comment: Glucose reference range applies only to samples taken after fasting for at least 8 hours.  Glucose, capillary     Status: Abnormal   Collection Time: 03/13/23  5:07 PM  Result Value Ref Range   Glucose-Capillary 203 (H) 70 - 99  mg/dL    Comment: Glucose reference range applies only to samples taken after fasting for at least 8 hours.  Glucose, capillary     Status: Abnormal   Collection Time: 03/13/23  8:34 PM  Result Value Ref Range   Glucose-Capillary 169 (H) 70 - 99 mg/dL    Comment: Glucose reference range applies only to samples taken after fasting for at least 8 hours.   Comment 1 Notify RN   Glucose, capillary     Status: Abnormal   Collection Time: 03/14/23 11:42 AM  Result Value Ref Range   Glucose-Capillary 263 (H) 70 - 99 mg/dL    Comment: Glucose reference range applies only to samples taken after fasting for at least 8 hours.   Comment 1 Notify RN   Glucose, capillary     Status: Abnormal   Collection Time: 03/14/23  4:20 PM  Result Value Ref Range   Glucose-Capillary 127 (H) 70 - 99 mg/dL    Comment: Glucose reference range applies only to samples taken after fasting for at least 8 hours.   Comment 1 Notify RN   Glucose, capillary     Status: Abnormal   Collection Time: 03/14/23  8:53 PM  Result Value Ref Range   Glucose-Capillary 221 (H) 70 - 99 mg/dL    Comment: Glucose reference range applies only to samples taken after fasting for at least 8 hours.   Comment 1 Notify RN   Basic metabolic panel     Status: Abnormal   Collection Time: 03/15/23  5:17 AM  Result Value Ref Range   Sodium 139 135 - 145 mmol/L   Potassium 4.0 3.5 - 5.1 mmol/L   Chloride 112 (H) 98 - 111 mmol/L   CO2 18 (  L) 22 - 32 mmol/L   Glucose, Bld 134 (H) 70 - 99 mg/dL    Comment: Glucose reference range applies only to samples taken after fasting for at least 8 hours.   BUN 28 (H) 8 - 23 mg/dL   Creatinine, Ser 2.59 (H) 0.44 - 1.00 mg/dL   Calcium 8.5 (L) 8.9 - 10.3 mg/dL   GFR, Estimated 36 (L) >60 mL/min    Comment: (NOTE) Calculated using the CKD-EPI Creatinine Equation (2021)    Anion gap 9 5 - 15    Comment: Performed at [facility], [address] [place], [place] [address]  Glucose, capillary      Status: Abnormal   Collection Time: 03/15/23  6:20 AM  Result Value Ref Range   Glucose-Capillary 129 (H) 70 - 99 mg/dL    Comment: Glucose reference range applies only to samples taken after fasting for at least 8 hours.  Glucose, capillary     Status: Abnormal   Collection Time: 03/15/23 11:40 AM  Result Value Ref Range   Glucose-Capillary 171 (H) 70 - 99 mg/dL    Comment: Glucose reference range applies only to samples taken after fasting for at least 8 hours.   Comment 1 Notify RN   Glucose, capillary     Status: Abnormal   Collection Time: 03/18/23  1:39 PM  Result Value Ref Range   Glucose-Capillary 107 (H) 70 - 99 mg/dL    Comment: Glucose reference range applies only to samples taken after fasting for at least 8 hours.  Protime-INR     Status: Abnormal   Collection Time: 03/18/23  2:00 PM  Result Value Ref Range   Prothrombin Time 15.5 (H) 11.4 - 15.2 seconds   INR 1.2 0.8 - 1.2    Comment: (NOTE) INR goal varies based on device and disease states. Performed at [facility], [address] [place], [place] [address]   APTT     Status: None   Collection Time: 03/18/23  2:00 PM  Result Value Ref Range   aPTT 29 24 - 36 seconds    Comment: Performed at [facility], [address] [place], [place] [address]  CBC     Status: Abnormal   Collection Time: 03/18/23  2:00 PM  Result Value Ref Range   WBC 4.4 4.0 - 10.5 K/uL   RBC 3.32 (L) 3.87 - 5.11 MIL/uL   Hemoglobin 10.6 (L) 12.0 - 15.0 g/dL   HCT 84.1 (L) 66.0 - 63.0 %   MCV 89.8 80.0 - 100.0 fL   MCH 31.9 26.0 - 34.0 pg   MCHC 35.6 30.0 - 36.0 g/dL   RDW 16.0 (H) 10.9 - 32.3 %   Platelets 133 (L) 150 - 400 K/uL   nRBC 0.0 0.0 - 0.2 %    Comment: Performed at [facility], [address] [place], [place] [address]  Differential     Status: None   Collection Time: 03/18/23  2:00 PM  Result Value Ref Range   Neutrophils Relative % 72 %   Neutro Abs 3.2 1.7 - 7.7 K/uL   Lymphocytes  Relative 20 %   Lymphs Abs 0.9 0.7 - 4.0 K/uL   Monocytes Relative 5 %   Monocytes Absolute 0.2 0.1 - 1.0 K/uL   Eosinophils Relative 3 %   Eosinophils Absolute 0.1 0.0 - 0.5 K/uL   Basophils Relative 0 %   Basophils Absolute 0.0 0.0 - 0.1 K/uL   Immature Granulocytes 0 %  Abs Immature Granulocytes 0.01 0.00 - 0.07 K/uL    Comment: Performed at [facility], [address] [place], [place] [address]  Comprehensive metabolic panel     Status: Abnormal   Collection Time: 03/18/23  2:00 PM  Result Value Ref Range   Sodium 141 135 - 145 mmol/L   Potassium 4.2 3.5 - 5.1 mmol/L   Chloride 112 (H) 98 - 111 mmol/L   CO2 21 (L) 22 - 32 mmol/L   Glucose, Bld 149 (H) 70 - 99 mg/dL    Comment: Glucose reference range applies only to samples taken after fasting for at least 8 hours.   BUN 31 (H) 8 - 23 mg/dL   Creatinine, Ser 6.04 (H) 0.44 - 1.00 mg/dL   Calcium 8.1 (L) 8.9 - 10.3 mg/dL   Total Protein 6.1 (L) 6.5 - 8.1 g/dL   Albumin 3.0 (L) 3.5 - 5.0 g/dL   AST 19 15 - 41 U/L   ALT 10 0 - 44 U/L   Alkaline Phosphatase 110 38 - 126 U/L   Total Bilirubin 0.8 0.3 - 1.2 mg/dL   GFR, Estimated 37 (L) >60 mL/min    Comment: (NOTE) Calculated using the CKD-EPI Creatinine Equation (2021)    Anion gap 8 5 - 15    Comment: Performed at [facility], [address] [place], [place] [address]  Ethanol     Status: None   Collection Time: 03/18/23  2:00 PM  Result Value Ref Range   Alcohol, Ethyl (B) <10 <10 mg/dL    Comment: (NOTE) Lowest detectable limit for serum alcohol is 10 mg/dL.  For medical purposes only. Performed at [facility], [address] [place], [place] [address]   Blood gas, venous     Status: Abnormal   Collection Time: 03/18/23  2:43 PM  Result Value Ref Range   pH, Ven 7.29 7.25 - 7.43   pCO2, Ven 47 44 - 60 mmHg   Bicarbonate 22.6 20.0 - 28.0 mmol/L   Acid-base deficit 4.2 (H) 0.0 - 2.0 mmol/L   Patient temperature 37.0     Collection site VENOUS     Comment: Performed at [facility], [address] [place], [place] [address]  Ammonia     Status: None   Collection Time: 03/18/23  3:39 PM  Result Value Ref Range   Ammonia 11 9 - 35 umol/L    Comment: Performed at [facility], [address] [place], [place] [address]  Urinalysis, Routine w reflex microscopic -Urine, Clean Catch     Status: Abnormal   Collection Time: 03/18/23  4:35 PM  Result Value Ref Range   Color, Urine STRAW (A) YELLOW   APPearance CLEAR (A) CLEAR   Specific Gravity, Urine 1.021 1.005 - 1.030   pH 5.0 5.0 - 8.0   Glucose, UA NEGATIVE NEGATIVE mg/dL   Hgb urine dipstick NEGATIVE NEGATIVE   Bilirubin Urine NEGATIVE NEGATIVE   Ketones, ur NEGATIVE NEGATIVE mg/dL   Protein, ur 865 (A) NEGATIVE mg/dL   Nitrite NEGATIVE NEGATIVE   Leukocytes,Ua NEGATIVE NEGATIVE   RBC / HPF 0 0 - 5 RBC/hpf   WBC, UA 0-5 0 - 5 WBC/hpf   Bacteria, UA NONE SEEN NONE SEEN   Squamous Epithelial / HPF 0-5 0 - 5 /HPF    Comment: Performed at [facility], [address], [place], [place] [address]  Urine Drug Screen, Qualitative ([facility] only)     Status: None  Collection Time: 03/18/23  4:35 PM  Result Value Ref Range   Tricyclic, Ur Screen NONE DETECTED NONE DETECTED   Amphetamines, Ur Screen NONE DETECTED NONE DETECTED   MDMA (Ecstasy)Ur Screen NONE DETECTED NONE DETECTED   Cocaine Metabolite,Ur [place] NONE DETECTED NONE DETECTED   Opiate, Ur Screen NONE DETECTED NONE DETECTED   Phencyclidine (PCP) Ur S NONE DETECTED NONE DETECTED   Cannabinoid 50 Ng, Ur [place] NONE DETECTED NONE DETECTED   Barbiturates, Ur Screen NONE DETECTED NONE DETECTED   Benzodiazepine, Ur Scrn NONE DETECTED NONE DETECTED   Methadone Scn, Ur NONE DETECTED NONE DETECTED    Comment: (NOTE) Tricyclics + metabolites, urine    Cutoff 1000 ng/mL Amphetamines + metabolites, urine  Cutoff 1000 ng/mL MDMA (Ecstasy), urine              Cutoff 500 ng/mL Cocaine  Metabolite, urine          Cutoff 300 ng/mL Opiate + metabolites, urine        Cutoff 300 ng/mL Phencyclidine (PCP), urine         Cutoff 25 ng/mL Cannabinoid, urine                 Cutoff 50 ng/mL Barbiturates + metabolites, urine  Cutoff 200 ng/mL Benzodiazepine, urine              Cutoff 200 ng/mL Methadone, urine                   Cutoff 300 ng/mL  The urine drug screen provides only a preliminary, unconfirmed analytical test result and should not be used for non-medical purposes. Clinical consideration and professional judgment should be applied to any positive drug screen result due to possible interfering substances. A more specific alternate chemical method must be used in order to obtain a confirmed analytical result. Gas chromatography / mass spectrometry (GC/MS) is the preferred confirm atory method. Performed at [facility], [address] [place], [place] [address]   Glucose, capillary     Status: Abnormal   Collection Time: 03/18/23 10:19 PM  Result Value Ref Range   Glucose-Capillary 175 (H) 70 - 99 mg/dL    Comment: Glucose reference range applies only to samples taken after fasting for at least 8 hours.  Basic metabolic panel     Status: Abnormal   Collection Time: 03/19/23  4:38 AM  Result Value Ref Range   Sodium 139 135 - 145 mmol/L   Potassium 4.0 3.5 - 5.1 mmol/L   Chloride 111 98 - 111 mmol/L   CO2 20 (L) 22 - 32 mmol/L   Glucose, Bld 198 (H) 70 - 99 mg/dL    Comment: Glucose reference range applies only to samples taken after fasting for at least 8 hours.   BUN 24 (H) 8 - 23 mg/dL   Creatinine, Ser 8.41 (H) 0.44 - 1.00 mg/dL   Calcium 7.9 (L) 8.9 - 10.3 mg/dL   GFR, Estimated 46 (L) >60 mL/min    Comment: (NOTE) Calculated using the CKD-EPI Creatinine Equation (2021)    Anion gap 8 5 - 15    Comment: Performed at [facility], [address] [place], [place] [address]  CBC     Status: Abnormal   Collection Time: 03/19/23  4:38  AM  Result Value Ref Range   WBC 3.7 (L) 4.0 - 10.5 K/uL   RBC 3.25 (L) 3.87 - 5.11 MIL/uL   Hemoglobin 10.5 (L) 12.0 - 15.0 g/dL  HCT 28.8 (L) 36.0 - 46.0 %   MCV 88.6 80.0 - 100.0 fL   MCH 32.3 26.0 - 34.0 pg   MCHC 36.5 (H) 30.0 - 36.0 g/dL   RDW 08.6 (H) 57.8 - 46.9 %   Platelets 147 (L) 150 - 400 K/uL   nRBC 0.0 0.0 - 0.2 %    Comment: Performed at [facility], [address] [place], [place] [address]  Glucose, capillary     Status: Abnormal   Collection Time: 03/19/23  7:36 AM  Result Value Ref Range   Glucose-Capillary 162 (H) 70 - 99 mg/dL    Comment: Glucose reference range applies only to samples taken after fasting for at least 8 hours.  Glucose, capillary     Status: Abnormal   Collection Time: 03/19/23  4:35 PM  Result Value Ref Range   Glucose-Capillary 155 (H) 70 - 99 mg/dL    Comment: Glucose reference range applies only to samples taken after fasting for at least 8 hours.  Glucose, capillary     Status: Abnormal   Collection Time: 03/19/23  9:16 PM  Result Value Ref Range   Glucose-Capillary 230 (H) 70 - 99 mg/dL    Comment: Glucose reference range applies only to samples taken after fasting for at least 8 hours.  Glucose, capillary     Status: Abnormal   Collection Time: 03/20/23  6:03 AM  Result Value Ref Range   Glucose-Capillary 129 (H) 70 - 99 mg/dL    Comment: Glucose reference range applies only to samples taken after fasting for at least 8 hours.  Glucose, capillary     Status: Abnormal   Collection Time: 03/20/23  8:23 AM  Result Value Ref Range   Glucose-Capillary 133 (H) 70 - 99 mg/dL    Comment: Glucose reference range applies only to samples taken after fasting for at least 8 hours.  Glucose, capillary     Status: Abnormal   Collection Time: 03/20/23 12:19 PM  Result Value Ref Range   Glucose-Capillary 256 (H) 70 - 99 mg/dL    Comment: Glucose reference range applies only to samples taken after fasting for at least 8 hours.   Glucose, capillary     Status: Abnormal   Collection Time: 03/20/23  4:48 PM  Result Value Ref Range   Glucose-Capillary 103 (H) 70 - 99 mg/dL    Comment: Glucose reference range applies only to samples taken after fasting for at least 8 hours.  Glucose, capillary     Status: Abnormal   Collection Time: 03/20/23  9:31 PM  Result Value Ref Range   Glucose-Capillary 243 (H) 70 - 99 mg/dL    Comment: Glucose reference range applies only to samples taken after fasting for at least 8 hours.  Glucose, capillary     Status: Abnormal   Collection Time: 03/21/23  8:35 AM  Result Value Ref Range   Glucose-Capillary 135 (H) 70 - 99 mg/dL    Comment: Glucose reference range applies only to samples taken after fasting for at least 8 hours.  Glucose, capillary     Status: Abnormal   Collection Time: 03/21/23 12:34 PM  Result Value Ref Range   Glucose-Capillary 169 (H) 70 - 99 mg/dL    Comment: Glucose reference range applies only to samples taken after fasting for at least 8 hours.  CUP PACEART REMOTE DEVICE CHECK     Status: None   Collection Time: 03/31/23  7:02 AM  Result Value Ref Range  Pulse Generator Manufacturer BITR    Date Time Interrogation Session [id number]    Pulse Gen Model [id number] Shirlee Latch Serial Number [id number]    Clinic Name [facility]    Implantable Pulse Generator Type ICM/ILR    Implantable Pulse Generator Implant Date [id number]          has a past medical history of Arthritis, Asthma, COPD (chronic obstructive pulmonary disease) (HCC), Depression, Diabetes mellitus, Embolic stroke (HCC) (11/03/2020), GERD (gastroesophageal reflux disease), Headache(784.0), Hypertension, Loop Biotronik loop implant 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   reports that she quit smoking about 5 years ago. Her smoking  use included cigarettes. She started smoking about 25 years ago. She has a 10 pack-year smoking history. She has never used smokeless tobacco.  Past Surgical History:  Procedure Laterality Date   ANAL RECTAL MANOMETRY N/A 07/13/2016   Procedure: ANO RECTAL MANOMETRY;  Surgeon: Romie Levee, MD;  Location: [facility];  Service: Endoscopy;  Laterality: N/A;   CESAREAN SECTION     EUS N/A 11/21/2012   Procedure: LOWER ENDOSCOPIC ULTRASOUND (EUS);  Surgeon: Willis Modena, MD;  Location: [facility];  Service: Endoscopy;  Laterality: N/A;   FLEXIBLE SIGMOIDOSCOPY N/A 11/21/2012   Procedure: FLEXIBLE SIGMOIDOSCOPY;  Surgeon: Willis Modena, MD;  Location: [facility];  Service: Endoscopy;  Laterality: N/A;   FLEXIBLE SIGMOIDOSCOPY N/A 01/28/2013   Procedure: FLEXIBLE SIGMOIDOSCOPY;  Surgeon: Romie Levee, MD;  Location: [facility];  Service: Endoscopy;  Laterality: N/A;   LAPAROSCOPIC LOW ANTERIOR RESECTION N/A 01/29/2013   Procedure: LAPAROSCOPIC LOW ANTERIOR RESECTION, Rigid Proctoscopy;  Surgeon: Romie Levee, MD;  Location: [facility];  Service: General;  Laterality: N/A;   LAPAROSCOPIC SIGMOID COLECTOMY N/A 11/14/2012   Procedure: DIAGNOSTIC LAPAROSCOPY AND SIGMOIDMOIDOSCOPY ;  Surgeon: Emelia Loron, MD;  Location: [facility];  Service: General;  Laterality: N/A;   RECTAL ULTRASOUND N/A 07/13/2016   Procedure: RECTAL ULTRASOUND;  Surgeon: Romie Levee, MD;  Location: [facility];  Service: Endoscopy;  Laterality: N/A;   TONSILLECTOMY     TONSILLECTOMY AND ADENOIDECTOMY      Allergies  Allergen Reactions   Penicillins Hives, Itching and Swelling    Tongue swelling    Immunization History  Administered Date(s) Administered   Fluad Quad(high Dose 65+) 04/28/2021   Fluad Trivalent(High Dose 65+) 03/05/2023   Influenza Inj Mdck Quad With Preservative 03/01/2022   Influenza Split 03/13/2017   Influenza Whole 04/26/2018   Influenza, High Dose Seasonal PF 02/29/2020   Influenza,inj,Quad  PF,6+ Mos 03/09/2019   Influenza-Unspecified 03/13/2016   PFIZER(Purple Top)SARS-COV-2 Vaccination 08/19/2019, 09/18/2019, 04/16/2020   Pneumococcal Conjugate-13 07/04/2019   Pneumococcal Polysaccharide-23 01/21/2005   Zoster Recombinant(Shingrix) 07/04/2019    Family History  Problem Relation Age of Onset   Hypertension Father    Stroke Father    Diabetes Father    Heart disease Mother    Hypertension Mother    Hyperlipidemia Mother    Hypertension Sister    Hypertension Brother    Hypertension Sister    Hypertension Brother    Cancer Other 50       colon cancer    Cancer Maternal Aunt        cancer, unknown type    Cancer Maternal Uncle        bone cancer    Cancer Paternal Aunt        lung cancer   Cancer  Paternal Uncle        lung cancer   Cancer Maternal Uncle        colon cancer and prostate cancer    Cancer Maternal Uncle        prostate cancer      Current Outpatient Medications:    acetaminophen (TYLENOL) 325 MG tablet, Take 2 tablets (650 mg total) by mouth 2 (two) times daily., Disp: , Rfl:    albuterol (VENTOLIN HFA) 108 (90 Base) MCG/ACT inhaler, Inhale 2 puffs into the lungs every 4 (four) hours as needed for wheezing., Disp: 1 g, Rfl: 5   amLODipine (NORVASC) 10 MG tablet, Take 1 tablet (10 mg total) by mouth daily., Disp: 30 tablet, Rfl: 0   aspirin EC 81 MG tablet, Take 1 tablet (81 mg total) by mouth daily. Swallow whole., Disp: 120 tablet, Rfl: 12   cyanocobalamin 1000 MCG tablet, Take 1 tablet (1,000 mcg total) by mouth daily for 30 days then as directed by MD, Disp: 130 tablet, Rfl: 0   diclofenac Sodium (VOLTAREN) 1 % GEL, SMARTSIG:4 Gram(s) Topical 4 Times Daily PRN, Disp: , Rfl:    gabapentin (NEURONTIN) 100 MG capsule, Take 1 capsule (100 mg total) by mouth 2 (two) times daily. Patient is taking as needed, Disp: 60 capsule, Rfl: 0   HYDROcodone-acetaminophen (NORCO/VICODIN) 5-325 MG tablet, Take 1 tablet by mouth every 6 (six) hours as needed.,  Disp: , Rfl:    insulin glargine-yfgn (SEMGLEE) 100 UNIT/ML injection, Inject 0.3 mLs (30 Units total) into the skin daily., Disp: , Rfl:    Insulin Pen Needle (PEN NEEDLES) 32G X 6 MM MISC, 1 application by Does not apply route 2 (two) times daily., Disp: 100 each, Rfl: 1   loratadine (CLARITIN) 10 MG tablet, Take 1 tablet (10 mg total) by mouth daily as needed for allergies., Disp: 30 tablet, Rfl: 0   naphazoline-glycerin (CLEAR EYES REDNESS) 0.012-0.25 % SOLN, Place 1-2 drops into both eyes 3 (three) times daily with meals., Disp: , Rfl:    pantoprazole (PROTONIX) 40 MG tablet, Take 40 mg by mouth daily., Disp: , Rfl:    Pirfenidone 801 MG TABS, TAKE 1 TABLET (801 MG) BY MOUTH WITH BREAKFAST, WITH LUNCH, AND WITH EVENING MEAL. (Patient taking differently: Take 1 tablet by mouth daily. Patient is taking 1 tablet daily current with breakfast), Disp: 270 tablet, Rfl: 0   sacubitril-valsartan (ENTRESTO) 49-51 MG, Take 1 tablet by mouth 2 (two) times daily., Disp: 60 tablet, Rfl: 0   sertraline (ZOLOFT) 50 MG tablet, Take 1 tablet (50 mg total) by mouth daily., Disp: 30 tablet, Rfl: 0   SYMBICORT 160-4.5 MCG/ACT inhaler, INHALE 2 PUFFS INTO THE LUNGS TWICE DAILY, Disp: 10.2 g, Rfl: 5   traZODone (DESYREL) 100 MG tablet, Take 1 tablet (100 mg total) by mouth at bedtime., Disp: 30 tablet, Rfl: 0   venlafaxine XR (EFFEXOR-XR) 150 MG 24 hr capsule, Take 1 capsule (150 mg total) by mouth daily., Disp: 30 capsule, Rfl: 0   rosuvastatin (CRESTOR) 40 MG tablet, Take 1 tablet (40 mg total) by mouth daily., Disp: 30 tablet, Rfl: 0      Objective:   Vitals:   04/21/23 0915  BP: 130/70  Pulse: 66  SpO2: 98%  Weight: 169 lb (76.7 kg)  Height: 5\' 4"  (1.626 m)    Estimated body mass index is 29.01 kg/m as calculated from the following:   Height as of this encounter: 5\' 4"  (1.626 m).   Weight as of  this encounter: 169 lb (76.7 kg).  @WEIGHTCHANGE @  [employer]   04/21/23 0915  Weight: 169 lb  (76.7 kg)     Physical Exam   General: No distress. Looks well O2 at rest: no Cane present:YES Sitting in wheel chair: no Frail: no Obese: no Neuro: Alert and Oriented x 3. GCS 15. Speech normal Psych: Pleasant Resp:  Barrel Chest - no.  Wheeze - no, Crackles - yes mild, No overt respiratory distress CVS: Normal heart sounds. Murmurs - n Ext: Stigmata of Connective Tissue Disease - no HEENT: Normal upper airway. PEERL +. No post nasal drip        Assessment:       ICD-10-CM   1. IPF (idiopathic pulmonary fibrosis) (HCC)  J84.112     2. Medication monitoring encounter  Z51.81     3. Pulmonary emphysema, unspecified emphysema type (HCC)  J43.9     4. Anemia, unspecified type  D64.9     5. Chronic kidney disease, unspecified CKD stage  N18.9          Plan:     Patient Instructions     ICD-10-CM   1. IPF (idiopathic pulmonary fibrosis) (HCC)  J84.112 Hepatic function panel    Pulmonary function test    Hepatic function panel    2. Medication monitoring encounter  Z51.81     3. Pulmonary emphysema, unspecified emphysema type (HCC)  J43.9     4. Chronic kidney disease, unspecified CKD stage  N18.9     5. Anemia, unspecified type  D64.9         Clinically pulmonary fibrosis is stable and overall toeleraring esbriet well  Liver, Anemia and kidney are stable as of Oct 2024  Glad domestic situation is better  Plan -Check cbc, bmet liver function test today 04/21/2023 ad repeat in 3 months  - cMA to put order  --Continue pirfenidone currently at full dose protocol --Continue inhaler therapy Symbicort scheduled for your emphysema  - no clinical trial for now given social situation - do spiromety and dlco in 6 months   Follow-up -Return in 6 months to see Dr. Marchelle Gearing but after PFT   Symptom sscore and walk test at followup;15 min visit    FOLLOWUP Return in about 6 months (around 10/19/2023) for 15 min visit, after Cleda Daub and DLCO, with Dr Marchelle Gearing,  Face to Face Visit.    SIGNATURE    Dr. Kalman Shan, M.D., F.C.C.P,  Pulmonary and Critical Care Medicine Staff Physician, [facility] Director - Interstitial Lung Disease  Program  Pulmonary Fibrosis [facility] at [facility] [place], [place], [address]  Pager: [phone], If no answer or between  15:00h - 7:00h: call [phone] Telephone: [phone]  9:33 AM 04/21/2023

## 2023-04-20 NOTE — Progress Notes (Deleted)
[facility] [address] [place]. [place] [address] [phone]       HOSPITAL FOLLOW UP NOTE  [name] Date of Birth:  1955/11/19 Medical Record Number:  [id number]   Reason for Referral:  hospital stroke follow up    SUBJECTIVE:   CHIEF COMPLAINT:  No chief complaint on file.    HPI:   Update 04/20/2023 JM: Patient returns for hospital follow-up.  She presented to [facility] ED with altered mental status on 03/04/2023. CTH negative for acute stroke.  MRI brain showed small acute to subacute bilateral centrum semiovale infarcts.  MRA brain normal.  Carotid duplex less than 50% stenosis bilateral ICAs, minimal calcified plaque at left ICA origin.  EF 65 to 70%.  Evaluated by neurology team, suspected infarcts bilateral small vessel disease.  Recommended DAPT for 3 weeks then aspirin alone and increase Crestor from 20 mg to 40 mg daily. LDL 86. A1c 6.9.  She was discharged to CIR.  She returned to ED 10/5 after syncopal episode.  Noted orthostatic hypotension likely contributing to syncopal event.  CTH negative. CTA head/neck minimal arthrosclerotic calcifications of bilateral ICAs without significant stenosis otherwise unremarkable.  MRI brain showed acute left thalamic infarct and multiple chronic infarcts including bilateral occipital lobe infarcts, watershed territory infarcts and diffuse subcortical white matter disease, and bilateral basal ganglia and cerebellar infarcts.  She was seen by the neurology team who felt etiology likely small vessel disease.  Recommended DAPT for 21 days and continue Crestor.  Loop recorder interrogated without evidence of A-fib.  She was discharged with home health therapies.             History provided for reference purposes only Update 12/02/2021 JM: Patient returns for stroke follow-up after prior visit 7 months ago.  She did present to ED on 6/12 with complaints of worsening left-sided facial numbness and gradual left  leg weakness throughout the day as well as walking difficulty on 6/10, due to persisting she proceeded to ED.  Completed MRI brain which was negative for acute stroke.  Symptoms gradually improved prior to discharge. Lab work markable except elevated creatinine level advise follow-up with cardiology to discuss Entresto.  She has been doing since then. Does confirm she returned back to baseline functioning prior to leaving.  Feels as though left-sided weakness has improved since prior visit.  Ambulating with cane, denies any recent falls.  Does still have mild left hand numbness and left lip tingling.  She is concerned regarding her short-term memory which has been poor since her stroke. She will forget recent conversations or walk in to a room and forget why she went in to the room. No issues with long term.  Does do word searches at home. Does admit to some underlying anxiety and difficulty concentrating.  Is currently on sertraline and venlafaxine managed by PCP.  Sleep is poor with longstanding insomnia, has sleep study in 2018 which was negative for sleep apnea (per pt report, unable to view results via epic). Reports her mother had Alzheimer's disease although never formally diagnosed and did have a large stroke in her 56's which was around the time she started to have memory loss. She is fearful that she may be developing Alzheimer's dementia and would like to see a psychologist for such.   Reports compliance on aspirin and Crestor, denies side effects.  Blood pressure today 145/85.  Reports recent A1c 5.2.  Routinely followed by PCP.   No further concerns at this  time  Update 04/29/2021 JM: Returns for 41-month stroke follow-up unaccompanied  Continues to work with therapies making gradual improving of left sided weakness. Continues to use RW and at times her cane. Denies any recent falls.  Denies new stroke/TIA symptoms. Her son continues to live with her but able to maintain ADLs and majority of  IADLs independently.   Compliant on aspirin and crestor - denies side effects Blood pressure today slightly increased - reports increased stressors today but monitors at home and typically stable Glucose levels stable at home -has not had recent A1c check Recent direct LDL 37  Plans to contact PCP to schedule follow-up visit Routinely followed by pulmonology for pulmonary fibrosis  No new concerns at this time  Update 12/30/2020 JM: [name] returns for hospital follow-up regarding recent stroke on 11/03/2020.  She presented with 2-3 day hx of headaches and worsening left-sided weakness.  Personally reviewed hospitalization pertinent progress notes, lab work and imaging summary provided.  Evaluated by Dr. Pearlean Brownie with stroke work-up revealing right parietal occipital stroke likely due to small vessel disease although unable to rule out cardioembolic source given recent right carotid head and right anterior lateral thalamic infarcts.  MRA negative LVO.  Carotid Dopplers unremarkable.  Normal EF.  Loop recorder recommended but declined by patient inpatient.  Prior 14-day cardiac monitor completed which was negative for A. fib.  On Plavix PTA and recommended DAPT for 3 weeks and aspirin alone.  HTN stable.  LDL 56 on Crestor 20 mg daily.  Controlled DM with A1c 5.2.  Other stroke risk factors include recent stroke (see below), advanced age, former tobacco use and CAD.  Discharged to CIR on 11/06/2020 -11/21/2020.  Since discharge, she has been making gradual improvements.  Currently working with home health PT for residual left-sided weakness and left sided numbness. She has since completed SLP. She has also been having headaches since her stroke - has been taking tramadol with benefit which was started by PMR.  Continues to ambulate with RW at all times -denies any recent falls. Her son is currently living with her. Able to maintain ADLs independently. Completed 3 weeks DAPT and remains on aspirin alone as  well as Crestor without associated side effects.  Blood pressure today 128/79.  Had loop recorder placed by Dr. Odis Hollingshead 7/6.   Sister jointed visit towards the end of visit - she is concerned regarding worsening depression and living situation as son does not assist with patients care.  She has not had recent visit with PCP.  She is currently on trazodone, venlafaxine, and buspirone  Initial visit 08/19/2020 JM: Ms. Hiraldo is being seen for hospital follow-up accompanied by her son.  She was discharged home from CIR on 07/15/2020.  Reports residual left sided weakness with numbness/tingling and left lower facial numbness Working with [facility] PT/OT 2x/ week -reports continued improvement since discharge Currently ambulating with rolling walker but she was not using previously -denies any recent falls Lives with her son who will assist as needed but she has been slowly becoming more independent with ADLs Denies new stroke/TIA symptoms  Completed 3 weeks DAPT and remains on Plavix alone -denies side effects Reports compliance on atorvastatin 40 mg daily -denies side effects Blood pressure today satisfactory 127/78 -monitors at home and typically stable Glucose levels routinely monitored at home which have been fluctuating per patient report  She has not had follow-up with PCP or cardiology since discharge  No further concerns at this time  Stroke admission  06/25/2020: Ms. INESSA WARDROP is a 67 y.o. female with history of hypertension, hypercholesteremia, diabetes complicated by neuropathy, CAD, depression C/V prior suicide attempt, arthritis, 10-pack-year history of smoking quit in 2020, COPD/asthma, polysubstance abuse who presented to [facility] ED on 06/25/2020 with slurred speech on and off for past week and left leg weakness.    Personally reviewed hospitalization pertinent progress notes, lab work and imaging with summary provided.  Evaluated by Dr. Roda Shutters with stroke work-up revealing right caudate head and right  anterior lateral thalamic infarcts, likely secondary to small vessel disease however given 2 separate locations unable to rule out embolic source with 30-day cardiac event monitor outpatient.  Recommended DAPT for 3 weeks and Plavix alone.  LDL 86 -increase atorvastatin from 20 mg to 40 mg daily and ongoing use of fenofibrate and Vascepa.  Controlled DM with A1c 5.2.  Other stroke risk factors include advanced age, obesity, CAD, hx of cocaine use and former tobacco use.  Residual deficits of left hemiparesis and left hemisensory impairment.  Evaluated by therapies and discharged to CIR for ongoing therapy needs  Stroke: Lacunar infarcts in right caudate head and right anterior lateral thalamus, likely due to small vessel disease. However, given two separate location will need to rule out emoblic source CT head: Lacunar infarcts in the right caudate and right cerebellum, new from 2012, but age indeterminate. MRI : 1 cm acute infarction in the anterolateral thalamus on the right, possibly with some involvement of the posterior limb internal capsule. Acute infarction within the right caudate head.  MRA : Both internal carotid arteries widely patent through the skull base and siphon regions. No stenosis.  Carotid Doppler: Velocities in the left and right ICA are consistent with a 1-39%  stenosis.  2D Echo: Left ventricular ejection fraction, by estimation, is 55 to 60%.  30 day cardiac event monitoring as outpt to rule out afib LDL 86 HgbA1c 5.2 RPR/HIV neg VTE prophylaxis -Lovenox 30 mg aspirin 81 mg daily prior to admission, now on aspirin 81 mg daily and Plavix DAPT for 3 weeks, then Plavix 75 mg alone.  Therapy recommendations: CIR Disposition: d/c to CIR on 06/30/2020       ROS:   14 system review of systems performed and negative with exception of those listed in HPI  PMH:  Past Medical History:  Diagnosis Date   Arthritis    especially in shoulders   Asthma    COPD (chronic  obstructive pulmonary disease) (HCC)    Depression    Diabetes mellitus    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   GERD (gastroesophageal reflux disease)    with chronic cough   Headache(784.0)    "mild"   Hypertension    Loop Biotronik loop implant 12/16/2020 12/16/2020   Mental disorder    depression   Neuropathy    feet    Pain    arthritis pain - takes tramadol as needed   Pulmonary fibrosis, unspecified (HCC)    Rectal cancer (HCC)    Rectal polyp    very little bleeding with bowel movements- no pain   Stroke (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   Suicide attempt (HCC)     PSH:  Past Surgical History:  Procedure Laterality Date   ANAL RECTAL MANOMETRY N/A 07/13/2016   Procedure: ANO RECTAL MANOMETRY;  Surgeon: Romie Levee, MD;  Location: [facility];  Service: Endoscopy;  Laterality: N/A;   CESAREAN SECTION     EUS N/A 11/21/2012   Procedure: LOWER ENDOSCOPIC ULTRASOUND (EUS);  Surgeon: Willis Modena, MD;  Location: [facility];  Service: Endoscopy;  Laterality: N/A;   FLEXIBLE SIGMOIDOSCOPY N/A 11/21/2012   Procedure: FLEXIBLE SIGMOIDOSCOPY;  Surgeon: Willis Modena, MD;  Location: [facility];  Service: Endoscopy;  Laterality: N/A;   FLEXIBLE SIGMOIDOSCOPY N/A 01/28/2013   Procedure: FLEXIBLE SIGMOIDOSCOPY;  Surgeon: Romie Levee, MD;  Location: [facility];  Service: Endoscopy;  Laterality: N/A;   LAPAROSCOPIC LOW ANTERIOR RESECTION N/A 01/29/2013   Procedure: LAPAROSCOPIC LOW ANTERIOR RESECTION, Rigid Proctoscopy;  Surgeon: Romie Levee, MD;  Location: [facility];  Service: General;  Laterality: N/A;   LAPAROSCOPIC SIGMOID COLECTOMY N/A 11/14/2012   Procedure: DIAGNOSTIC LAPAROSCOPY AND SIGMOIDMOIDOSCOPY ;  Surgeon: Emelia Loron, MD;  Location: [facility];  Service: General;  Laterality: N/A;   RECTAL ULTRASOUND N/A 07/13/2016   Procedure: RECTAL ULTRASOUND;  Surgeon: Romie Levee, MD;  Location: [facility];  Service: Endoscopy;  Laterality: N/A;   TONSILLECTOMY     TONSILLECTOMY AND  ADENOIDECTOMY      Social History:  Social History   Socioeconomic History   Marital status: Single    Spouse name: Not on file   Number of children: 1   Years of education: Not on file   Highest education level: Not on file  Occupational History   Not on file  Tobacco Use   Smoking status: Former    Current packs/day: 0.00    Average packs/day: 0.5 packs/day for 20.0 years (10.0 ttl pk-yrs)    Types: Cigarettes    Start date: 01/05/1998    Quit date: 01/05/2018    Years since quitting: 5.2   Smokeless tobacco: Never   Tobacco comments:    Unknown   Vaping Use   Vaping status: Never Used  Substance and Sexual Activity   Alcohol use: Not Currently   Drug use: Not Currently    Types: "Crack" cocaine    Comment: Unknown    Sexual activity: Not Currently    Birth control/protection: None  Other Topics Concern   Not on file  Social History Narrative   12/30/20 lives with others   Social Determinants of Health   Financial Resource Strain: Not on file  Food Insecurity: No Food Insecurity (03/19/2023)   Hunger Vital Sign    Worried About Running Out of Food in the Last Year: Never true    Ran Out of Food in the Last Year: Never true  Recent Concern: Food Insecurity - Food Insecurity Present (02/01/2023)   Hunger Vital Sign    Worried About Running Out of Food in the Last Year: Sometimes true    Ran Out of Food in the Last Year: Patient declined  Transportation Needs: No Transportation Needs (03/19/2023)   PRAPARE - Administrator, Civil Service (Medical): No    Lack of Transportation (Non-Medical): No  Recent Concern: Transportation Needs - Unmet Transportation Needs (02/01/2023)   PRAPARE - Administrator, Civil Service (Medical): Yes    Lack of Transportation (Non-Medical): Yes  Physical Activity: Not on file  Stress: Not on file  Social Connections: Unknown (07/25/2022)   Received from [facility]   Social Network    Social  Network: Not on file  Intimate Partner Violence: Not At Risk (03/19/2023)   Humiliation, Afraid, Rape, and Kick questionnaire    Fear of Current or Ex-Partner: No    Emotionally Abused: No    Physically Abused: No    Sexually Abused: No  Recent Concern: Intimate Partner Violence - At  Risk (02/01/2023)   Humiliation, Afraid, Rape, and Kick questionnaire    Fear of Current or Ex-Partner: Yes    Emotionally Abused: No    Physically Abused: Patient declined    Sexually Abused: No    Family History:  Family History  Problem Relation Age of Onset   Hypertension Father    Stroke Father    Diabetes Father    Heart disease Mother    Hypertension Mother    Hyperlipidemia Mother    Hypertension Sister    Hypertension Brother    Hypertension Sister    Hypertension Brother    Cancer Other 63       colon cancer    Cancer Maternal Aunt        cancer, unknown type    Cancer Maternal Uncle        bone cancer    Cancer Paternal Aunt        lung cancer   Cancer Paternal Uncle        lung cancer   Cancer Maternal Uncle        colon cancer and prostate cancer    Cancer Maternal Uncle        prostate cancer     Medications:   Current Outpatient Medications on File Prior to Visit  Medication Sig Dispense Refill   acetaminophen (TYLENOL) 325 MG tablet Take 2 tablets (650 mg total) by mouth 2 (two) times daily.     albuterol (VENTOLIN HFA) 108 (90 Base) MCG/ACT inhaler Inhale 2 puffs into the lungs every 4 (four) hours as needed for wheezing. 1 g 5   amLODipine (NORVASC) 10 MG tablet Take 1 tablet (10 mg total) by mouth daily. 30 tablet 0   aspirin EC 81 MG tablet Take 1 tablet (81 mg total) by mouth daily. Swallow whole. 120 tablet 12   cyanocobalamin 1000 MCG tablet Take 1 tablet (1,000 mcg total) by mouth daily for 30 days then as directed by MD 130 tablet 0   diclofenac Sodium (VOLTAREN) 1 % GEL SMARTSIG:4 Gram(s) Topical 4 Times Daily PRN     gabapentin (NEURONTIN) 100 MG capsule Take  1 capsule (100 mg total) by mouth 2 (two) times daily. Patient is taking as needed 60 capsule 0   HYDROcodone-acetaminophen (NORCO/VICODIN) 5-325 MG tablet Take 1 tablet by mouth every 6 (six) hours as needed.     insulin glargine-yfgn (SEMGLEE) 100 UNIT/ML injection Inject 0.3 mLs (30 Units total) into the skin daily.     Insulin Pen Needle (PEN NEEDLES) 32G X 6 MM MISC 1 application by Does not apply route 2 (two) times daily. 100 each 1   loratadine (CLARITIN) 10 MG tablet Take 1 tablet (10 mg total) by mouth daily as needed for allergies. 30 tablet 0   naphazoline-glycerin (CLEAR EYES REDNESS) 0.012-0.25 % SOLN Place 1-2 drops into both eyes 3 (three) times daily with meals.     pantoprazole (PROTONIX) 40 MG tablet Take 40 mg by mouth daily.     Pirfenidone 801 MG TABS TAKE 1 TABLET (801 MG) BY MOUTH WITH BREAKFAST, WITH LUNCH, AND WITH EVENING MEAL. (Patient taking differently: Take 1 tablet by mouth daily. Patient is taking 1 tablet daily current with breakfast) 270 tablet 0   rosuvastatin (CRESTOR) 40 MG tablet Take 1 tablet (40 mg total) by mouth daily. 30 tablet 0   sacubitril-valsartan (ENTRESTO) 49-51 MG Take 1 tablet by mouth 2 (two) times daily. 60 tablet 0   sertraline (ZOLOFT) 50 MG tablet  Take 1 tablet (50 mg total) by mouth daily. 30 tablet 0   SYMBICORT 160-4.5 MCG/ACT inhaler INHALE 2 PUFFS INTO THE LUNGS TWICE DAILY 10.2 g 5   traZODone (DESYREL) 100 MG tablet Take 1 tablet (100 mg total) by mouth at bedtime. 30 tablet 0   venlafaxine XR (EFFEXOR-XR) 150 MG 24 hr capsule Take 1 capsule (150 mg total) by mouth daily. 30 capsule 0   No current facility-administered medications on file prior to visit.    Allergies:   Allergies  Allergen Reactions   Penicillins Hives, Itching and Swelling    Tongue swelling      OBJECTIVE:  Physical Exam  There were no vitals filed for this visit.  There is no height or weight on file to calculate BMI. No results found.   General:  well developed, well nourished,  very pleasant elderly African-American female, seated, in no evident distress Head: head normocephalic and atraumatic.   Neck: supple with no carotid or supraclavicular bruits Cardiovascular: regular rate and rhythm, no murmurs Musculoskeletal: no deformity Skin:  no rash/petichiae Vascular:  Normal pulses all extremities   Neurologic Exam Mental Status: Awake and fully alert.  Fluent speech and language.  Oriented to place and time. Recent memory impaired and remote memory intact. Attention span, concentration and fund of knowledge appropriate during visit. Mood and affect appropriate. Recall 0/3 (instantly said "I dont remember" as soon as I asked her to recall 3 words), with prompt 2/3. Serial addition impaired. 4 legged animal naming 10 in 60 seconds. Clock drawing 4/4.  Cranial Nerves: Pupils equal, briskly reactive to light. Extraocular movements full without nystagmus. Visual fields full to confrontation. Hearing intact.  Decreased light touch left lower facial sensation.  Mild left lower facial weakness.  Tongue and palate moves normally and symmetrically.  Motor: Normal bulk and tone and normal strength on the right side.  Mild left hand and ankle dorsiflexion weakness Sensory.:  Intact to light touch, vibratory and pinprick sensation throughout Coordination: Rapid alternating movements normal in all extremities except slightly decreased left hand. Finger-to-nose and heel-to-shin performed accurately bilaterally.   Gait and Station: Arises from chair without difficulty. Stance is normal. Gait demonstrates slightly decreased step height of LLE with use of foot drop brace and cane, mild unsteadiness initially.  Unable to complete tandem walk or heel toe.   Reflexes: 1+ and symmetric. Toes downgoing.     PERTINENT IMAGING:  MRI brain 03/04/2023 IMPRESSION: 1. Small acute to subacute infarcts in the bilateral centrum semiovale without hemorrhage or mass  effect. 2. Multiple remote infarcts in the supratentorial and infratentorial brain as above.  03/18/2023 IMPRESSION: 1. Acute punctate nonhemorrhagic infarct of the left thalamus. 2. Previously noted subcortical infarcts of the centrum semi ovale demonstrate expected change in are only faintly seen on the fusion weighted images without definite restricted diffusion. 3. 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 4. Remote watershed territory infarcts and diffuse subcortical white matter disease is also stable. 5. Remote lacunar infarcts of the basal ganglia and cerebellum bilaterally are stable.  CTA head/neck 03/18/2023 IMPRESSION: 1. Minimal atherosclerotic calcifications within the cavernous internal carotid arteries bilaterally without significant stenosis. 2. Otherwise normal CTA Circle of Willis without significant proximal stenosis, aneurysm, or branch vessel occlusion. 3. Normal CT angio of the chest.  No extracranial stenosis. 4. Degenerative changes of the lower cervical spine. 5.  Emphysema (ICD10-J43.9).        ASSESSMENT: Erica [name] is a 67 y.o.  year old female with recent acute to subacute bilateral centrum semiovale infarcts 02/12/2023 and left thalamic infarct on 03/18/2023 likely secondary to small vessel disease and history of recurrent right MCA strokes on 11/03/2020 and 06/25/2020 likely in setting of small vessel disease although unable to rule out cardioembolic source with residual left hemiparesis and sensory impairment.  Loop recorder placed 12/2020 by Dr. Odis Hollingshead [facility] cardiology).  Vascular risk factors include HTN, HLD, DM, CAD, history of tobacco use and history of polysubstance abuse and multiple prior strokes noted on imaging.  Likely episode of recrudescence of prior stroke symptoms 6/12 with work-up unremarkable and quickly returned to baseline.     PLAN:  History of multiple strokes:  Residual deficit: Mild left  hemiparesis.  Continue working with PT/OT for hopeful ongoing recovery Recommend initiating **** Plavix 75 mg daily and discontinue aspirin 81 mg daily as she had reoccurring strokes on aspirin therapy and no Crestor 40 mg daily and fenofibrate for secondary stroke prevention.  S/p ILR 12/16/2020 - monitored by [facility] cardiology Dr. Odis Hollingshead.  Interrogated 03/2023 without evidence of A-fib discussed secondary stroke prevention measures and importance of close PCP follow up for aggressive stroke risk factor management including BP goal<130/90, HLD with LDL goal<70 and DM with A1c.<7  Memory loss:  Likely multifactorial mild cognitive impairment in setting of multiple prior strokes, anxiety, history of polysubstance abuse, poor sleep habits and underlying comorbidities.  Lab work 02/2023: B12 737, Vit D 23.78, TSH 0.916  Highly encouraged routine memory exercises as well as increasing daily physical activity, ensuring healthy diet and adequate sleep.  Also discussed importance of anxiety and stress management.  Referral placed for neurocognitive evaluation as patient strongly requested this to be completed     Doing well from stroke standpoint without further recommendations and risk factors are managed by PCP. She may follow up PRN, as usual for our patients who are strictly being followed for stroke. If any new neurological issues should arise, request PCP place referral for evaluation by one of our neurologists. Thank you.      CC:  PCP: Fleet Contras, MD    I spent 36 minutes of face-to-face and non-face-to-face time with patient.  This included previsit chart review, lab review, study review, order entry, electronic health record documentation, patient education and discussion regarding recurrent strokes including etiology, secondary stroke prevention measures and importance of managing stroke risk factors, residual deficits and memory concerns and answered all other questions to patient  satisfaction  Ihor Austin, AGNP-BC  [facility] [address] [place], [place] [address]  Phone [phone] Fax [phone] Note: This document was prepared with digital dictation and possible smart phrase technology. Any transcriptional errors that result from this process are unintentional.

## 2023-04-20 NOTE — Patient Instructions (Addendum)
ICD-10-CM   1. IPF (idiopathic pulmonary fibrosis) (HCC)  J84.112 Hepatic function panel    Pulmonary function test    Hepatic function panel    2. Medication monitoring encounter  Z51.81     3. Pulmonary emphysema, unspecified emphysema type (HCC)  J43.9     4. Chronic kidney disease, unspecified CKD stage  N18.9     5. Anemia, unspecified type  D64.9         Clinically pulmonary fibrosis is stable and overall toeleraring esbriet well  Liver, Anemia and kidney are stable as of Oct 2024  Glad domestic situation is better  Plan -Check cbc, bmet liver function test today 04/21/2023 ad repeat in 3 months  - cMA to put order  --Continue pirfenidone currently at full dose protocol --Continue inhaler therapy Symbicort scheduled for your emphysema  - no clinical trial for now given social situation - do spiromety and dlco in 6 months   Follow-up -Return in 6 months to see Erica [name] but after PFT   Symptom sscore and walk test at followup;15 min visit

## 2023-04-21 ENCOUNTER — Ambulatory Visit: Payer: Medicare HMO | Admitting: Internal Medicine

## 2023-04-21 ENCOUNTER — Ambulatory Visit (INDEPENDENT_AMBULATORY_CARE_PROVIDER_SITE_OTHER): Payer: Medicare HMO | Admitting: Internal Medicine

## 2023-04-21 ENCOUNTER — Encounter: Payer: Self-pay | Admitting: Internal Medicine

## 2023-04-21 VITALS — BP 130/70 | HR 66 | Ht 64.0 in | Wt 169.0 lb

## 2023-04-21 DIAGNOSIS — Z5181 Encounter for therapeutic drug level monitoring: Secondary | ICD-10-CM | POA: Diagnosis not present

## 2023-04-21 DIAGNOSIS — J439 Emphysema, unspecified: Secondary | ICD-10-CM

## 2023-04-21 DIAGNOSIS — J84112 Idiopathic pulmonary fibrosis: Secondary | ICD-10-CM

## 2023-04-21 DIAGNOSIS — D649 Anemia, unspecified: Secondary | ICD-10-CM | POA: Diagnosis not present

## 2023-04-21 DIAGNOSIS — N189 Chronic kidney disease, unspecified: Secondary | ICD-10-CM

## 2023-04-21 NOTE — Addendum Note (Signed)
Addended by: Glynda Jaeger on: 04/21/2023 09:39 AM   Modules accepted: Orders

## 2023-04-21 NOTE — Progress Notes (Signed)
Spirometry/DLCO performed today. 

## 2023-04-21 NOTE — Patient Instructions (Signed)
Spirometry/DLCO performed today. 

## 2023-05-03 ENCOUNTER — Telehealth: Payer: Self-pay

## 2023-05-03 ENCOUNTER — Ambulatory Visit: Payer: Medicare HMO

## 2023-05-03 DIAGNOSIS — I639 Cerebral infarction, unspecified: Secondary | ICD-10-CM

## 2023-05-03 LAB — CUP PACEART REMOTE DEVICE CHECK
Date Time Interrogation Session: 20241120072755
Implantable Pulse Generator Implant Date: 20220706
Pulse Gen Model: 436066
Pulse Gen Serial Number: 94069553

## 2023-05-05 ENCOUNTER — Ambulatory Visit: Payer: Medicare HMO | Attending: Cardiovascular Disease

## 2023-05-05 DIAGNOSIS — I639 Cerebral infarction, unspecified: Secondary | ICD-10-CM

## 2023-05-05 NOTE — Progress Notes (Signed)
AF sensitivity changed from high to medium.

## 2023-05-10 ENCOUNTER — Ambulatory Visit: Payer: Medicare Other | Admitting: Podiatry

## 2023-05-15 ENCOUNTER — Ambulatory Visit
Admission: RE | Admit: 2023-05-15 | Discharge: 2023-05-15 | Disposition: A | Discharge status: Home / Self Care | Payer: Medicare HMO | Source: Ambulatory Visit

## 2023-05-15 ENCOUNTER — Other Ambulatory Visit: Payer: Self-pay

## 2023-05-15 DIAGNOSIS — M25561 Pain in right knee: Secondary | ICD-10-CM

## 2023-05-15 NOTE — Progress Notes (Unsigned)
[facility] [address] [place]. [place] [address] [phone]       HOSPITAL FOLLOW UP NOTE  [name] Date of Birth:  [birth year] Medical Record Number:  [id number]   Reason for visit: hospital stroke follow up    SUBJECTIVE:   CHIEF COMPLAINT:  Chief Complaint  Patient presents with   Follow-up    Patient in room #3 and alone.  Patient states she been better but she would like to discus why she having these strokes.     HPI:   Update 04/20/2023 JM: Patient returns for hospital follow-up.  She presented to [facility] ED with altered mental status on 03/04/2023. CTH negative for acute stroke.  MRI brain showed small acute to subacute bilateral centrum semiovale infarcts.  MRA brain normal.  Carotid duplex less than 50% stenosis bilateral ICAs, minimal calcified plaque at left ICA origin.  EF 65 to 70%.  Evaluated by neurology team, suspected infarcts bilateral small vessel disease.  Recommended DAPT for 3 weeks then aspirin alone and increase Crestor from 20 mg to 40 mg daily. LDL 86. A1c 6.9.  She was discharged to CIR.  She returned to ED 10/5 after syncopal episode.  Noted orthostatic hypotension likely contributing to syncopal event.  CTH negative. CTA head/neck minimal arthrosclerotic calcifications of bilateral ICAs without significant stenosis otherwise unremarkable.  MRI brain showed acute left thalamic infarct and multiple chronic infarcts including bilateral occipital lobe infarcts, watershed territory infarcts and diffuse subcortical white matter disease, and bilateral basal ganglia and cerebellar infarcts.  She was seen by the neurology team who felt etiology likely small vessel disease.  Recommended DAPT for 21 days and continue Crestor.  Loop recorder interrogated without evidence of A-fib.  She was discharged with home health therapies.  Currently, she reports being stable since discharge.  She reports residual difficulty with speech, worsening  left-sided weakness weakness (compared to baseline from prior stroke) and some imbalance. Has not been able to start therapy due to difficulty scheduling.  Ambulates with cane, no recent falls. Lives in own home but does have son and sister to assist as needed.  She does not drive, was brought to todays visit by family, waiting in waiting room. Denies new stroke/TIA symptoms.   She does report excessive daytime fatigue even prior to recent strokes.  Has difficulty falling asleep at night, will use trazodone if needed but prefers to avoid if able as this causes night terrors.  Has been told that she snores.  Unsure if witnessed apneas as she sleeps alone.  She did undergo prior sleep study in 2018 but she reports she did not sleep well so they were not able to get accurate results, did review results which did show concern of OSA.   Completed 3 weeks DAPT, remains on aspirin as advised and Crestor without side effects.  Routinely follows with PCP for stroke risk factor management - reports starting Actos yesterday by PCP and stopping insulin but she is concerned glucose levels elevated this morning, she was encouraged to reach out to their office to further discuss.  Blood pressure today slightly elevated, routinely monitors at home and can fluctuate typically ranging 100-140s/70-80s, reports BP managed by PCP. Was seen by cardiology on 11/6, was referred to hematology for hypercoagulable workup given recurrent strokes, has initial visit 12/18.  Loop recorder has not shown evidence of A-fib thus far.     History provided for reference purposes only Update 12/02/2021 JM: Patient returns for stroke follow-up  after prior visit 7 months ago.  She did present to ED on 6/12 with complaints of worsening left-sided facial numbness and gradual left leg weakness throughout the day as well as walking difficulty on 6/10, due to persisting she proceeded to ED.  Completed MRI brain which was negative for acute stroke.   Symptoms gradually improved prior to discharge. Lab work markable except elevated creatinine level advise follow-up with cardiology to discuss Entresto.  She has been doing since then. Does confirm she returned back to baseline functioning prior to leaving.  Feels as though left-sided weakness has improved since prior visit.  Ambulating with cane, denies any recent falls.  Does still have mild left hand numbness and left lip tingling.  She is concerned regarding her short-term memory which has been poor since her stroke. She will forget recent conversations or walk in to a room and forget why she went in to the room. No issues with long term.  Does do word searches at home. Does admit to some underlying anxiety and difficulty concentrating.  Is currently on sertraline and venlafaxine managed by PCP.  Sleep is poor with longstanding insomnia, has sleep study in 2018 which was negative for sleep apnea (per pt report, unable to view results via epic). Reports her mother had Alzheimer's disease although never formally diagnosed and did have a large stroke in her 42's which was around the time she started to have memory loss. She is fearful that she may be developing Alzheimer's dementia and would like to see a psychologist for such.   Reports compliance on aspirin and Crestor, denies side effects.  Blood pressure today 145/85.  Reports recent A1c 5.2.  Routinely followed by PCP.   No further concerns at this time  Update 04/29/2021 JM: Returns for 66-month stroke follow-up unaccompanied  Continues to work with therapies making gradual improving of left sided weakness. Continues to use RW and at times her cane. Denies any recent falls.  Denies new stroke/TIA symptoms. Her son continues to live with her but able to maintain ADLs and majority of IADLs independently.   Compliant on aspirin and crestor - denies side effects Blood pressure today slightly increased - reports increased stressors today but monitors  at home and typically stable Glucose levels stable at home -has not had recent A1c check Recent direct LDL 37  Plans to contact PCP to schedule follow-up visit Routinely followed by pulmonology for pulmonary fibrosis  No new concerns at this time  Update 12/30/2020 JM: Ms. Hilgers returns for hospital follow-up regarding recent stroke on 11/03/2020.  She presented with 2-3 day hx of headaches and worsening left-sided weakness.  Personally reviewed hospitalization pertinent progress notes, lab work and imaging summary provided.  Evaluated by Dr. Pearlean Brownie with stroke work-up revealing right parietal occipital stroke likely due to small vessel disease although unable to rule out cardioembolic source given recent right carotid head and right anterior lateral thalamic infarcts.  MRA negative LVO.  Carotid Dopplers unremarkable.  Normal EF.  Loop recorder recommended but declined by patient inpatient.  Prior 14-day cardiac monitor completed which was negative for A. fib.  On Plavix PTA and recommended DAPT for 3 weeks and aspirin alone.  HTN stable.  LDL 56 on Crestor 20 mg daily.  Controlled DM with A1c 5.2.  Other stroke risk factors include recent stroke (see below), advanced age, former tobacco use and CAD.  Discharged to CIR on 11/06/2020 -11/21/2020.  Since discharge, she has been making gradual improvements.  Currently  working with home health PT for residual left-sided weakness and left sided numbness. She has since completed SLP. She has also been having headaches since her stroke - has been taking tramadol with benefit which was started by PMR.  Continues to ambulate with RW at all times -denies any recent falls. Her son is currently living with her. Able to maintain ADLs independently. Completed 3 weeks DAPT and remains on aspirin alone as well as Crestor without associated side effects.  Blood pressure today 128/79.  Had loop recorder placed by Dr. Odis Hollingshead 7/6.   Sister jointed visit towards the end of visit  - she is concerned regarding worsening depression and living situation as son does not assist with patients care.  She has not had recent visit with PCP.  She is currently on trazodone, venlafaxine, and buspirone  Initial visit 08/19/2020 JM: Ms. Bodine is being seen for hospital follow-up accompanied by her son.  She was discharged home from CIR on 07/15/2020.  Reports residual left sided weakness with numbness/tingling and left lower facial numbness Working with [facility] PT/OT 2x/ week -reports continued improvement since discharge Currently ambulating with rolling walker but she was not using previously -denies any recent falls Lives with her son who will assist as needed but she has been slowly becoming more independent with ADLs Denies new stroke/TIA symptoms  Completed 3 weeks DAPT and remains on Plavix alone -denies side effects Reports compliance on atorvastatin 40 mg daily -denies side effects Blood pressure today satisfactory 127/78 -monitors at home and typically stable Glucose levels routinely monitored at home which have been fluctuating per patient report  She has not had follow-up with PCP or cardiology since discharge  No further concerns at this time  Stroke admission 06/25/2020: Ms. ASEES COOMER is a 67 y.o. female with history of hypertension, hypercholesteremia, diabetes complicated by neuropathy, CAD, depression C/V prior suicide attempt, arthritis, 10-pack-year history of smoking quit in 2020, COPD/asthma, polysubstance abuse who presented to [facility] ED on 06/25/2020 with slurred speech on and off for past week and left leg weakness.    Personally reviewed hospitalization pertinent progress notes, lab work and imaging with summary provided.  Evaluated by Dr. Roda Shutters with stroke work-up revealing right caudate head and right anterior lateral thalamic infarcts, likely secondary to small vessel disease however given 2 separate locations unable to rule out embolic source with 30-day cardiac event  monitor outpatient.  Recommended DAPT for 3 weeks and Plavix alone.  LDL 86 -increase atorvastatin from 20 mg to 40 mg daily and ongoing use of fenofibrate and Vascepa.  Controlled DM with A1c 5.2.  Other stroke risk factors include advanced age, obesity, CAD, hx of cocaine use and former tobacco use.  Residual deficits of left hemiparesis and left hemisensory impairment.  Evaluated by therapies and discharged to CIR for ongoing therapy needs  Stroke: Lacunar infarcts in right caudate head and right anterior lateral thalamus, likely due to small vessel disease. However, given two separate location will need to rule out emoblic source CT head: Lacunar infarcts in the right caudate and right cerebellum, new from 2012, but age indeterminate. MRI : 1 cm acute infarction in the anterolateral thalamus on the right, possibly with some involvement of the posterior limb internal capsule. Acute infarction within the right caudate head.  MRA : Both internal carotid arteries widely patent through the skull base and siphon regions. No stenosis.  Carotid Doppler: Velocities in the left and right ICA are consistent with a 1-39%  stenosis.  2D Echo: Left ventricular ejection fraction, by estimation, is 55 to 60%.  30 day cardiac event monitoring as outpt to rule out afib LDL 86 HgbA1c 5.2 RPR/HIV neg VTE prophylaxis -Lovenox 30 mg aspirin 81 mg daily prior to admission, now on aspirin 81 mg daily and Plavix DAPT for 3 weeks, then Plavix 75 mg alone.  Therapy recommendations: CIR Disposition: d/c to CIR on 06/30/2020       ROS:   14 system review of systems performed and negative with exception of those listed in HPI  PMH:  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   GERD (gastroesophageal reflux disease)    with chronic cough    Headache(784.0)    "mild"   Hypertension    Loop Biotronik loop implant 12/16/2020 12/16/2020   Mental disorder    depression   Neuropathy    feet    Pain    arthritis pain - takes tramadol as needed   Pulmonary fibrosis, unspecified (HCC)    Rectal cancer (HCC)    Rectal polyp    very little bleeding with bowel movements- no pain   Stroke (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   Suicide attempt (HCC)     PSH:  Past Surgical History:  Procedure Laterality Date   ANAL RECTAL MANOMETRY N/A 07/13/2016   Procedure: ANO RECTAL MANOMETRY;  Surgeon: Romie Levee, MD;  Location: [facility];  Service: Endoscopy;  Laterality: N/A;   CESAREAN SECTION     EUS N/A 11/21/2012   Procedure: LOWER ENDOSCOPIC ULTRASOUND (EUS);  Surgeon: Willis Modena, MD;  Location: [facility];  Service: Endoscopy;  Laterality: N/A;   FLEXIBLE SIGMOIDOSCOPY N/A 11/21/2012   Procedure: FLEXIBLE SIGMOIDOSCOPY;  Surgeon: Willis Modena, MD;  Location: [facility];  Service: Endoscopy;  Laterality: N/A;   FLEXIBLE SIGMOIDOSCOPY N/A 01/28/2013   Procedure: FLEXIBLE SIGMOIDOSCOPY;  Surgeon: Romie Levee, MD;  Location: [facility];  Service: Endoscopy;  Laterality: N/A;   LAPAROSCOPIC LOW ANTERIOR RESECTION N/A 01/29/2013   Procedure: LAPAROSCOPIC LOW ANTERIOR RESECTION, Rigid Proctoscopy;  Surgeon: Romie Levee, MD;  Location: [facility];  Service: General;  Laterality: N/A;   LAPAROSCOPIC SIGMOID COLECTOMY N/A 11/14/2012   Procedure: DIAGNOSTIC LAPAROSCOPY AND SIGMOIDMOIDOSCOPY ;  Surgeon: Emelia Loron, MD;  Location: [facility];  Service: General;  Laterality: N/A;   RECTAL ULTRASOUND N/A 07/13/2016   Procedure: RECTAL ULTRASOUND;  Surgeon: Romie Levee, MD;  Location: [facility];  Service: Endoscopy;  Laterality: N/A;   TONSILLECTOMY     TONSILLECTOMY AND ADENOIDECTOMY      Social History:  Social History   Socioeconomic History   Marital status: Single    Spouse name: Not on file   Number of children: 1   Years of education:  Not on file   Highest education level: Not on file  Occupational History   Not on file  Tobacco Use   Smoking status: Former    Current packs/day: 0.00    Average packs/day: 0.5 packs/day for 20.0 years (10.0 ttl pk-yrs)    Types: Cigarettes    Start date: 01/05/1998    Quit date: 01/05/2018    Years since quitting: 5.3   Smokeless tobacco: Never   Tobacco comments:    Unknown   Vaping Use   Vaping status: Never Used  Substance and Sexual Activity  Alcohol use: Not Currently   Drug use: Not Currently    Types: "Crack" cocaine    Comment: Unknown    Sexual activity: Not Currently    Birth control/protection: None  Other Topics Concern   Not on file  Social History Narrative   12/30/20 lives with others   Social Determinants of Health   Financial Resource Strain: Not on file  Food Insecurity: No Food Insecurity (03/19/2023)   Hunger Vital Sign    Worried About Running Out of Food in the Last Year: Never true    Ran Out of Food in the Last Year: Never true  Recent Concern: Food Insecurity - Food Insecurity Present (02/01/2023)   Hunger Vital Sign    Worried About Running Out of Food in the Last Year: Sometimes true    Ran Out of Food in the Last Year: Patient declined  Transportation Needs: No Transportation Needs (03/19/2023)   PRAPARE - Administrator, Civil Service (Medical): No    Lack of Transportation (Non-Medical): No  Recent Concern: Transportation Needs - Unmet Transportation Needs (02/01/2023)   PRAPARE - Administrator, Civil Service (Medical): Yes    Lack of Transportation (Non-Medical): Yes  Physical Activity: Not on file  Stress: Not on file  Social Connections: Unknown (07/25/2022)   [facility] from [facility]   Social Network    Social Network: Not on file  Intimate Partner Violence: Not At Risk (03/19/2023)   Humiliation, Afraid, Rape, and Kick questionnaire    Fear of Current or Ex-Partner: No    Emotionally Abused:  No    Physically Abused: No    Sexually Abused: No  Recent Concern: Intimate Partner Violence - At Risk (02/01/2023)   Humiliation, Afraid, Rape, and Kick questionnaire    Fear of Current or Ex-Partner: Yes    Emotionally Abused: No    Physically Abused: Patient declined    Sexually Abused: No    Family History:  Family History  Problem Relation Age of Onset   Hypertension Father    Stroke Father    Diabetes Father    Heart disease Mother    Hypertension Mother    Hyperlipidemia Mother    Hypertension Sister    Hypertension Brother    Hypertension Sister    Hypertension Brother    Cancer Other 23       colon cancer    Cancer Maternal Aunt        cancer, unknown type    Cancer Maternal Uncle        bone cancer    Cancer Paternal Aunt        lung cancer   Cancer Paternal Uncle        lung cancer   Cancer Maternal Uncle        colon cancer and prostate cancer    Cancer Maternal Uncle        prostate cancer     Medications:   Current Outpatient Medications on File Prior to Visit  Medication Sig Dispense Refill   acetaminophen (TYLENOL) 325 MG tablet Take 2 tablets (650 mg total) by mouth 2 (two) times daily.     albuterol (VENTOLIN HFA) 108 (90 Base) MCG/ACT inhaler Inhale 2 puffs into the lungs every 4 (four) hours as needed for wheezing. 1 g 5   amLODipine (NORVASC) 10 MG tablet Take 1 tablet (10 mg total) by mouth daily. 30 tablet 0   aspirin EC 81 MG tablet Take  1 tablet (81 mg total) by mouth daily. Swallow whole. 120 tablet 12   cyanocobalamin 1000 MCG tablet Take 1 tablet (1,000 mcg total) by mouth daily for 30 days then as directed by MD 130 tablet 0   diclofenac Sodium (VOLTAREN) 1 % GEL SMARTSIG:4 Gram(s) Topical 4 Times Daily PRN     FARXIGA 10 MG TABS tablet Take 10 mg by mouth daily.     gabapentin (NEURONTIN) 100 MG capsule Take 1 capsule (100 mg total) by mouth 2 (two) times daily. Patient is taking as needed 60 capsule 0   HYDROcodone-acetaminophen  (NORCO/VICODIN) 5-325 MG tablet Take 1 tablet by mouth every 6 (six) hours as needed.     insulin glargine-yfgn (SEMGLEE) 100 UNIT/ML injection Inject 0.3 mLs (30 Units total) into the skin daily.     Insulin Pen Needle (PEN NEEDLES) 32G X 6 MM MISC 1 application by Does not apply route 2 (two) times daily. 100 each 1   loratadine (CLARITIN) 10 MG tablet Take 1 tablet (10 mg total) by mouth daily as needed for allergies. 30 tablet 0   naphazoline-glycerin (CLEAR EYES REDNESS) 0.012-0.25 % SOLN Place 1-2 drops into both eyes 3 (three) times daily with meals.     pantoprazole (PROTONIX) 40 MG tablet Take 40 mg by mouth daily.     Pirfenidone 801 MG TABS TAKE 1 TABLET (801 MG) BY MOUTH WITH BREAKFAST, WITH LUNCH, AND WITH EVENING MEAL. (Patient taking differently: Take 1 tablet by mouth daily. Patient is taking 1 tablet daily current with breakfast) 270 tablet 0   sacubitril-valsartan (ENTRESTO) 49-51 MG Take 1 tablet by mouth 2 (two) times daily. 60 tablet 0   sertraline (ZOLOFT) 50 MG tablet Take 1 tablet (50 mg total) by mouth daily. 30 tablet 0   SYMBICORT 160-4.5 MCG/ACT inhaler INHALE 2 PUFFS INTO THE LUNGS TWICE DAILY 10.2 g 5   traZODone (DESYREL) 100 MG tablet Take 1 tablet (100 mg total) by mouth at bedtime. 30 tablet 0   triamcinolone cream (KENALOG) 0.1 % Apply 1 Application topically 2 (two) times daily.     venlafaxine XR (EFFEXOR-XR) 150 MG 24 hr capsule Take 1 capsule (150 mg total) by mouth daily. 30 capsule 0   Vitamin D, Ergocalciferol, (DRISDOL) 1.25 MG (50000 UNIT) CAPS capsule Take 50,000 Units by mouth once a week.     rosuvastatin (CRESTOR) 40 MG tablet Take 1 tablet (40 mg total) by mouth daily. 30 tablet 0   No current facility-administered medications on file prior to visit.    Allergies:   Allergies  Allergen Reactions   Penicillins Hives, Itching and Swelling    Tongue swelling      OBJECTIVE:  Physical Exam  Vitals:   05/16/23 0738  BP: (!) 144/80  Pulse:  67  Weight: 163 lb 9.6 oz (74.2 kg)  Height: 5\' 3"  (1.6 m)   Body mass index is 28.98 kg/m. No results found.  General: well developed, well nourished,  very pleasant elderly African-American female, seated, in no evident distress  Neurologic Exam Mental Status: Awake and fully alert.  Mild dysarthria.  No evidence of aphasia.  Oriented to place and time. Recent memory impaired and remote memory intact. Attention span, concentration and fund of knowledge appropriate during visit. Mood and affect appropriate.  Cranial Nerves: Pupils equal, briskly reactive to light. Extraocular movements full without nystagmus. Visual fields full to confrontation. Hearing intact.  Decreased light touch left lower facial sensation.  Slight left lower facial weakness.  Tongue and palate moves normally and symmetrically.  Motor: Normal bulk and tone and normal strength on the right side.  4/5 left hemiparesis although questionable effort and some giveaway weakness Sensory.:  Intact to light touch, vibratory and pinprick sensation throughout Coordination: Rapid alternating movements impaired L>R.  Gait and Station: Arises from chair without difficulty. Stance is normal. Gait demonstrates slightly decreased step height of LLE with use of foot drop brace and cane, mild unsteadiness initially.  Unable to complete tandem walk or heel toe.   Reflexes: 1+ and symmetric. Toes downgoing.     PERTINENT IMAGING:  MRI brain 03/04/2023 IMPRESSION: 1. Small acute to subacute infarcts in the bilateral centrum semiovale without hemorrhage or mass effect. 2. Multiple remote infarcts in the supratentorial and infratentorial brain as above.  MRI brain 03/18/2023 IMPRESSION: 1. Acute punctate nonhemorrhagic infarct of the left thalamus. 2. Previously noted subcortical infarcts of the centrum semi ovale demonstrate expected change in are only faintly seen on the fusion weighted images without definite restricted  diffusion. 3. 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 4. Remote watershed territory infarcts and diffuse subcortical white matter disease is also stable. 5. Remote lacunar infarcts of the basal ganglia and cerebellum bilaterally are stable.  CTA head/neck 03/18/2023 IMPRESSION: 1. Minimal atherosclerotic calcifications within the cavernous internal carotid arteries bilaterally without significant stenosis. 2. Otherwise normal CTA Circle of Willis without significant proximal stenosis, aneurysm, or branch vessel occlusion. 3. Normal CT angio of the chest.  No extracranial stenosis. 4. Degenerative changes of the lower cervical spine. 5.  Emphysema (ICD10-J43.9).        ASSESSMENT: Erica [name] is a 67 y.o. year old female with recent acute to subacute bilateral centrum semiovale infarcts 02/12/2023 and left thalamic infarct on 03/18/2023 likely secondary to small vessel disease.  History of recurrent right MCA strokes on 11/03/2020 and 06/25/2020 likely in setting of small vessel disease although unable to rule out cardioembolic source with residual left hemiparesis and sensory impairment.  Loop recorder placed 12/2020 by Dr. Odis Hollingshead [facility] cardiology).  Vascular risk factors include HTN, HLD, DM, CAD, history of tobacco use and history of polysubstance abuse and multiple prior strokes noted on imaging.       PLAN:  History of multiple strokes:  Residual deficit: Mild left hemiparesis, gait impairment, dysarthria.  Referral placed to neurorehab PT/OT/SLP.  Advised continued use of cane at all times for fall prevention. Recommend restarting Plavix 75 mg daily and discontinue aspirin 81 mg daily as she had reoccurring strokes on aspirin therapy and continue Crestor 40 mg daily (dosage increased after recent admission) for secondary stroke prevention. Will place order today for plavix but request ongoing refills by PCP as this will be recommended life long as  well as ongoing management of Crestor. S/p ILR 12/16/2020 - monitored by [facility] cardiology Dr. Odis Hollingshead.  Interrogated 03/2023 without evidence of A-fib F/u with hematology on 12/18 to evaluate for hypercoagulable disorder as recommended by cardiology discussed secondary stroke prevention measures and importance of close PCP follow up for aggressive stroke risk factor management including BP goal<130/90, HLD with LDL goal<70 and DM with A1c.<7  Stroke labs 02/2023: A1c 6.9, LDL 86  At risk for sleep apnea: Referral placed to [facility] sleep clinic for reevaluation of possible underlying sleep disorder in setting of recurrent strokes.  Also reports daytime sleepiness, insomnia and snoring.    Follow-up in 4 to 6 months or call earlier if needed    CC:  PCP: Fleet Contras, MD    I spent 58 minutes of face-to-face and non-face-to-face time with patient.  This included previsit chart review including review of recent hospitalizations, lab review, study review, order entry, electronic health record documentation, patient education and discussion regarding above diagnoses and treatment plan and answered all other questions to patient's satisfaction  Ihor Austin, [facility]  [facility] [address] [place], [place] [address]  Phone [phone] Fax [phone] Note: This document was prepared with digital dictation and possible smart phrase technology. Any transcriptional errors that result from this process are unintentional.

## 2023-05-16 ENCOUNTER — Ambulatory Visit (INDEPENDENT_AMBULATORY_CARE_PROVIDER_SITE_OTHER): Payer: Medicare HMO | Admitting: Adult Health

## 2023-05-16 ENCOUNTER — Encounter: Payer: Self-pay | Admitting: Adult Health

## 2023-05-16 VITALS — BP 144/80 | HR 67 | Ht 63.0 in | Wt 163.6 lb

## 2023-05-16 DIAGNOSIS — I69354 Hemiplegia and hemiparesis following cerebral infarction affecting left non-dominant side: Secondary | ICD-10-CM

## 2023-05-16 DIAGNOSIS — I69398 Other sequelae of cerebral infarction: Secondary | ICD-10-CM | POA: Diagnosis not present

## 2023-05-16 DIAGNOSIS — R269 Unspecified abnormalities of gait and mobility: Secondary | ICD-10-CM

## 2023-05-16 DIAGNOSIS — I639 Cerebral infarction, unspecified: Secondary | ICD-10-CM | POA: Diagnosis not present

## 2023-05-16 DIAGNOSIS — I69322 Dysarthria following cerebral infarction: Secondary | ICD-10-CM | POA: Diagnosis not present

## 2023-05-16 DIAGNOSIS — Z9189 Other specified personal risk factors, not elsewhere classified: Secondary | ICD-10-CM

## 2023-05-16 MED ORDER — CLOPIDOGREL BISULFATE 75 MG PO TABS: 75.0000 mg | ORAL_TABLET | Freq: Every day | ORAL | 5 refills | Status: DC

## 2023-05-16 NOTE — Patient Instructions (Addendum)
Referrals placed to start physical, occupational and speech therapy - if you do not hear from them by beginning of next week, please call them to schedule  Referral placed to our sleep center to evaluate for sleep apnea  Restart clopidogrel 75 mg daily (Plavix) and stop aspirin and continue Crestor 40mg  daily  for secondary stroke prevention  Continue to follow up with PCP regarding blood pressure, diabetes and cholesterol management  Maintain strict control of hypertension with blood pressure goal below 130/90, diabetes with hemoglobin A1c goal below 7.0 % and cholesterol with LDL cholesterol (bad cholesterol) goal below 70 mg/dL.   Signs of a Stroke? Follow the BEFAST method:  Balance Watch for a sudden loss of balance, trouble with coordination or vertigo Eyes Is there a sudden loss of vision in one or both eyes? Or double vision?  Face: Ask the person to smile. Does one side of the face droop or is it numb?  Arms: Ask the person to raise both arms. Does one arm drift downward? Is there weakness or numbness of a leg? Speech: Ask the person to repeat a simple phrase. Does the speech sound slurred/strange? Is the person confused ? Time: If you observe any of these signs, call 911.     Followup in the future with me in 4-6 months or call earlier if needed      Thank you for coming to see [place] at [facility]. I hope we have been able to provide you high quality care today.  You may receive a patient satisfaction survey over the next few weeks. We would appreciate your feedback and comments so that we may continue to improve ourselves and the health of our patients.

## 2023-05-18 LAB — PULMONARY FUNCTION TEST
DL/VA % pred: 60 %
DL/VA: 2.53 ml/min/mmHg/L
DLCO cor % pred: 48 %
DLCO cor: 9.49 ml/min/mmHg
DLCO unc % pred: 43 %
DLCO unc: 8.56 ml/min/mmHg
FEF 25-75 Pre: 1.89 L/s
FEF2575-%Pred-Pre: 94 %
FEV1-%Pred-Pre: 92 %
FEV1-Pre: 2.16 L
FEV1FVC-%Pred-Pre: 101 %
FEV6-%Pred-Pre: 94 %
FEV6-Pre: 2.78 L
FEV6FVC-%Pred-Pre: 104 %
FVC-%Pred-Pre: 91 %
FVC-Pre: 2.8 L
Pre FEV1/FVC ratio: 77 %
Pre FEV6/FVC Ratio: 100 %

## 2023-05-24 ENCOUNTER — Ambulatory Visit: Payer: Medicare HMO | Admitting: Podiatry

## 2023-05-29 ENCOUNTER — Ambulatory Visit: Payer: Medicare HMO | Admitting: Occupational Therapy

## 2023-05-29 ENCOUNTER — Ambulatory Visit: Payer: Medicare HMO

## 2023-05-29 ENCOUNTER — Ambulatory Visit: Payer: Medicare HMO | Admitting: Physical Therapy

## 2023-05-29 NOTE — Therapy (Incomplete)
OUTPATIENT PHYSICAL THERAPY NEURO EVALUATION   Patient Name: Erica [name] MRN: [id number] DOB:07/14/1955, 67 y.o., female Today's Date: 05/29/2023   PCP: Marland Kitchen REFERRING PROVIDER: Ihor Austin, NP  END OF SESSION:   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   GERD (gastroesophageal reflux disease)    with chronic cough   Headache(784.0)    "mild"   Hypertension    Loop Biotronik loop implant 12/16/2020 12/16/2020   Mental disorder    depression   Neuropathy    feet    Pain    arthritis pain - takes tramadol as needed   Pulmonary fibrosis, unspecified (HCC)    Rectal cancer (HCC)    Rectal polyp    very little bleeding with bowel movements- no pain   Stroke (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   Suicide attempt [facility])    Past Surgical History:  Procedure Laterality Date   ANAL RECTAL MANOMETRY N/A 07/13/2016   Procedure: ANO RECTAL MANOMETRY;  Surgeon: Romie Levee, MD;  Location: [facility] ENDOSCOPY;  Service: Endoscopy;  Laterality: N/A;   CESAREAN SECTION     EUS N/A 11/21/2012   Procedure: LOWER ENDOSCOPIC ULTRASOUND (EUS);  Surgeon: Willis Modena, MD;  Location: [facility];  Service: Endoscopy;  Laterality: N/A;   FLEXIBLE SIGMOIDOSCOPY N/A 11/21/2012   Procedure: FLEXIBLE SIGMOIDOSCOPY;  Surgeon: Willis Modena, MD;  Location: [facility];  Service: Endoscopy;  Laterality: N/A;   FLEXIBLE SIGMOIDOSCOPY N/A 01/28/2013   Procedure: FLEXIBLE SIGMOIDOSCOPY;  Surgeon: Romie Levee, MD;  Location: [facility];  Service: Endoscopy;  Laterality: N/A;   LAPAROSCOPIC LOW ANTERIOR RESECTION N/A 01/29/2013   Procedure: LAPAROSCOPIC LOW ANTERIOR RESECTION, Rigid Proctoscopy;  Surgeon: Romie Levee, MD;  Location: [facility]S;  Service: General;  Laterality: N/A;   LAPAROSCOPIC SIGMOID COLECTOMY N/A 11/14/2012   Procedure: DIAGNOSTIC  LAPAROSCOPY AND SIGMOIDMOIDOSCOPY ;  Surgeon: Emelia Loron, MD;  Location: [facility];  Service: General;  Laterality: N/A;   RECTAL ULTRASOUND N/A 07/13/2016   Procedure: RECTAL ULTRASOUND;  Surgeon: Romie Levee, MD;  Location: [facility];  Service: Endoscopy;  Laterality: N/A;   TONSILLECTOMY     TONSILLECTOMY AND ADENOIDECTOMY     Patient Active Problem List   Diagnosis Date Noted   Syncope 03/18/2023   Stroke (cerebrum) (HCC) 03/08/2023   Acute ischemic stroke (HCC) 03/05/2023   Stroke-like symptom 03/04/2023   Research study patient 07/15/2022   Encounter for loop recorder check 01/02/2021   Loop Biotronik loop implant 12/16/2020 12/16/2020   Leukopenia    Controlled type 2 diabetes mellitus with hyperglycemia, with long-term current use of insulin (HCC)    Vascular headache    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   Sinus bradycardia on ECG 11/03/2020   Benign essential HTN    Pancytopenia (HCC)    Stage 3b chronic kidney disease (HCC)    Anemia    Labile blood glucose    Labile blood pressure    Diabetic peripheral neuropathy (HCC)    Essential hypertension    AKI (acute kidney injury) (HCC)    Ataxia, late effect of cerebrovascular disease    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   Acute CVA (cerebrovascular accident) (HCC) 06/26/2020   CVA (cerebral vascular accident) (HCC) 06/25/2020   BMI 32.0-32.9,adult 06/03/2020   Left upper lobe  pneumonia 03/30/2020   Chest pain 10/01/2019   MDD (major depressive disorder), recurrent severe, 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   Suicide attempt (HCC) 05/09/2019   Cocaine abuse (HCC) 05/09/2019   Interstitial lung disease (HCC) 06/21/2016   COPD (chronic obstructive pulmonary disease) (HCC) 06/21/2016   Cough 06/21/2016   GERD (gastroesophageal reflux disease) 06/21/2016   Hypersomnia 06/21/2016   Tobacco user 06/21/2016   Rectal cancer (HCC) 01/30/2013    ONSET DATE:  05/16/2023 (referral date)  REFERRING DIAG: I63.9 (ICD-10-CM) - Recurrent strokes (HCC) I69.354 (ICD-10-CM) - Hemiparesis affecting left side as late effect of cerebrovascular accident (HCC) I69.398,R26.9 (ICD-10-CM) - Gait disturbance, post-stroke  THERAPY DIAG:  No diagnosis found.  Rationale for Evaluation and Treatment: Rehabilitation  SUBJECTIVE:                                                                                                                                                                                             SUBJECTIVE STATEMENT: ***  Pt accompanied by: {accompnied:27141}  PERTINENT HISTORY:  PMH including but not limited to CVA, DM, anxiety, depression, GERD.  Per Ihor Austin note 05/16/23: Erica [name] is a 67 y.o. year old female with recent acute to subacute bilateral centrum semiovale infarcts 02/12/2023 and left thalamic infarct on 03/18/2023 likely secondary to small vessel disease.  History of recurrent right MCA strokes on 11/03/2020 and 06/25/2020 likely in setting of small vessel disease although unable to rule out cardioembolic source with residual left hemiparesis and sensory impairment.  Loop recorder placed 12/2020 by Dr. Odis Hollingshead [facility] cardiology).  Vascular risk factors include HTN, HLD, DM, CAD, history of tobacco use and history of polysubstance abuse and multiple prior strokes noted on imaging.   PAIN:  Are you having pain? {OPRCPAIN:27236}  PRECAUTIONS: {Therapy precautions:24002}  RED FLAGS: {PT Red Flags:29287}   WEIGHT BEARING RESTRICTIONS: {Yes ***/No:24003}  FALLS: Has patient fallen in last 6 months? {fallsyesno:27318}  LIVING ENVIRONMENT: Lives with: {OPRC lives with:25569::"lives with their family"} Lives in: {Lives in:25570} Stairs: {opstairs:27293} Has following equipment at home: {Assistive devices:23999}  PLOF: {PLOF:24004}  PATIENT GOALS: ***  OBJECTIVE:  Note: Objective measures were completed at Evaluation unless  otherwise noted.  DIAGNOSTIC FINDINGS:  MRI brain 03/04/2023 IMPRESSION: 1. Small acute to subacute infarcts in the bilateral centrum semiovale without hemorrhage or mass effect. 2. Multiple remote infarcts in the supratentorial and infratentorial brain as above.   MRI brain 03/18/2023 IMPRESSION: 1. Acute punctate nonhemorrhagic infarct of the left thalamus. 2. Previously noted subcortical infarcts of the centrum semi ovale demonstrate expected change in are only faintly seen on the fusion weighted images without definite  restricted diffusion. 3. 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 4. Remote watershed territory infarcts and diffuse subcortical white matter disease is also stable. 5. Remote lacunar infarcts of the basal ganglia and cerebellum bilaterally are stable.   CTA head/neck 03/18/2023 IMPRESSION: 1. Minimal atherosclerotic calcifications within the cavernous internal carotid arteries bilaterally without significant stenosis. 2. Otherwise normal CTA Circle of Willis without significant proximal stenosis, aneurysm, or branch vessel occlusion. 3. Normal CT angio of the chest.  No extracranial stenosis. 4. Degenerative changes of the lower cervical spine. 5.  Emphysema (ICD10-J43.9).  COGNITION: Overall cognitive status: {cognition:24006}   SENSATION: {sensation:27233}  COORDINATION: ***  EDEMA:  {edema:24020}  MUSCLE TONE: {LE tone:25568}  MUSCLE LENGTH: Hamstrings: Right *** deg; Left *** deg Maisie Fus test: Right *** deg; Left *** deg  DTRs:  {DTR SITE:24025}  POSTURE: {posture:25561}  LOWER EXTREMITY ROM:     {AROM/PROM:27142}  Right Eval Left Eval  Hip flexion    Hip extension    Hip abduction    Hip adduction    Hip internal rotation    Hip external rotation    Knee flexion    Knee extension    Ankle dorsiflexion    Ankle plantarflexion    Ankle inversion    Ankle eversion     (Blank rows = not  tested)  LOWER EXTREMITY MMT:    MMT Right Eval Left Eval  Hip flexion    Hip extension    Hip abduction    Hip adduction    Hip internal rotation    Hip external rotation    Knee flexion    Knee extension    Ankle dorsiflexion    Ankle plantarflexion    Ankle inversion    Ankle eversion    (Blank rows = not tested)  BED MOBILITY:  {Bed mobility:24027}  TRANSFERS: Assistive device utilized: {Assistive devices:23999}  Sit to stand: {Levels of assistance:24026} Stand to sit: {Levels of assistance:24026} Chair to chair: {Levels of assistance:24026} Floor: {Levels of assistance:24026}  RAMP:  Level of Assistance: {Levels of assistance:24026} Assistive device utilized: {Assistive devices:23999} Ramp Comments: ***  CURB:  Level of Assistance: {Levels of assistance:24026} Assistive device utilized: {Assistive devices:23999} Curb Comments: ***  STAIRS: Level of Assistance: {Levels of assistance:24026} Stair Negotiation Technique: {Stair Technique:27161} with {Rail Assistance:27162} Number of Stairs: ***  Height of Stairs: ***  Comments: ***  GAIT: Gait pattern: {gait characteristics:25376} Distance walked: *** Assistive device utilized: {Assistive devices:23999} Level of assistance: {Levels of assistance:24026} Comments: ***  FUNCTIONAL TESTS:  {Functional tests:24029}  PATIENT SURVEYS:  {rehab surveys:24030}  TODAY'S TREATMENT:                                                                                                                              ***    PATIENT EDUCATION: Education details: *** Person educated: {Person educated:25204} Education method: {Education Method:25205} Education comprehension: {Education Comprehension:25206}  HOME EXERCISE PROGRAM: ***  GOALS: Goals  reviewed with patient? {yes/no:20286}  SHORT TERM GOALS: Target date: ***  *** Baseline: Goal status: INITIAL  2.  *** Baseline:  Goal status: INITIAL  3.   *** Baseline:  Goal status: INITIAL  4.  *** Baseline:  Goal status: INITIAL  5.  *** Baseline:  Goal status: INITIAL  6.  *** Baseline:  Goal status: INITIAL  LONG TERM GOALS: Target date: ***  *** Baseline:  Goal status: INITIAL  2.  *** Baseline:  Goal status: INITIAL  3.  *** Baseline:  Goal status: INITIAL  4.  *** Baseline:  Goal status: INITIAL  5.  *** Baseline:  Goal status: INITIAL  6.  *** Baseline:  Goal status: INITIAL  ASSESSMENT:  CLINICAL IMPRESSION: Patient is a *** year old *** referred to Neuro OPPT for***.   Pt's PMH is significant for: *** The following deficits were present during the exam: ***. Based on ***, pt is an incr risk for falls. Pt would benefit from skilled PT to address these impairments and functional limitations to maximize functional mobility independence   OBJECTIVE IMPAIRMENTS: {opptimpairments:25111}.   ACTIVITY LIMITATIONS: {activitylimitations:27494}  PARTICIPATION LIMITATIONS: {participationrestrictions:25113}  PERSONAL FACTORS: {Personal factors:25162} are also affecting patient's functional outcome.   REHAB POTENTIAL: {rehabpotential:25112}  CLINICAL DECISION MAKING: {clinical decision making:25114}  EVALUATION COMPLEXITY: {Evaluation complexity:25115}  PLAN:  PT FREQUENCY: {rehab frequency:25116}  PT DURATION: {rehab duration:25117}  PLANNED INTERVENTIONS: {rehab planned interventions:25118::"97110-Therapeutic exercises","97530- Therapeutic [phone]- Neuromuscular re-education","97535- Self KGMW","10272- Manual therapy"}  PLAN FOR NEXT SESSION: ***   Peter Congo, PT Peter Congo, PT, DPT, [facility] [address] [place], [place]  [address] Phone:  [phone] Fax:  [phone]  05/29/2023, 8:13 AM

## 2023-05-30 NOTE — Progress Notes (Signed)
Biotronik Loop Recorder 

## 2023-05-31 ENCOUNTER — Ambulatory Visit: Payer: Medicare HMO | Admitting: Speech Pathology

## 2023-05-31 ENCOUNTER — Ambulatory Visit: Payer: Medicare HMO | Attending: Adult Health

## 2023-05-31 ENCOUNTER — Ambulatory Visit: Payer: Medicare HMO | Admitting: Occupational Therapy

## 2023-05-31 ENCOUNTER — Inpatient Hospital Stay: Payer: Medicare HMO | Admitting: Family

## 2023-05-31 ENCOUNTER — Other Ambulatory Visit: Payer: Self-pay | Admitting: Family

## 2023-05-31 ENCOUNTER — Inpatient Hospital Stay: Payer: Medicare HMO | Attending: Cardiology

## 2023-05-31 DIAGNOSIS — I6310 Cerebral infarction due to embolism of unspecified precerebral artery: Secondary | ICD-10-CM

## 2023-05-31 DIAGNOSIS — D6859 Other primary thrombophilia: Secondary | ICD-10-CM

## 2023-06-01 ENCOUNTER — Ambulatory Visit: Payer: Medicare HMO | Admitting: Podiatry

## 2023-06-09 ENCOUNTER — Other Ambulatory Visit: Payer: Self-pay

## 2023-06-09 ENCOUNTER — Emergency Department (HOSPITAL_COMMUNITY): Payer: Medicare HMO

## 2023-06-09 ENCOUNTER — Emergency Department (HOSPITAL_COMMUNITY)
Admission: EM | Admit: 2023-06-09 | Discharge: 2023-06-09 | Disposition: A | Discharge status: Home / Self Care | Payer: Medicare HMO | Attending: Emergency Medicine | Admitting: Emergency Medicine

## 2023-06-09 ENCOUNTER — Encounter (HOSPITAL_COMMUNITY): Payer: Self-pay | Admitting: Emergency Medicine

## 2023-06-09 DIAGNOSIS — Z794 Long term (current) use of insulin: Secondary | ICD-10-CM | POA: Insufficient documentation

## 2023-06-09 DIAGNOSIS — Z85048 Personal history of other malignant neoplasm of rectum, rectosigmoid junction, and anus: Secondary | ICD-10-CM | POA: Insufficient documentation

## 2023-06-09 DIAGNOSIS — Z7902 Long term (current) use of antithrombotics/antiplatelets: Secondary | ICD-10-CM | POA: Diagnosis not present

## 2023-06-09 DIAGNOSIS — Z8673 Personal history of transient ischemic attack (TIA), and cerebral infarction without residual deficits: Secondary | ICD-10-CM | POA: Insufficient documentation

## 2023-06-09 DIAGNOSIS — E119 Type 2 diabetes mellitus without complications: Secondary | ICD-10-CM | POA: Insufficient documentation

## 2023-06-09 DIAGNOSIS — R55 Syncope and collapse: Secondary | ICD-10-CM | POA: Diagnosis present

## 2023-06-09 DIAGNOSIS — R531 Weakness: Secondary | ICD-10-CM | POA: Diagnosis not present

## 2023-06-09 DIAGNOSIS — Z720 Tobacco use: Secondary | ICD-10-CM | POA: Insufficient documentation

## 2023-06-09 LAB — CBC
HCT: 28.3 % — ABNORMAL LOW (ref 36.0–46.0)
Hemoglobin: 10 g/dL — ABNORMAL LOW (ref 12.0–15.0)
MCH: 31.5 pg (ref 26.0–34.0)
MCHC: 35.3 g/dL (ref 30.0–36.0)
MCV: 89.3 fL (ref 80.0–100.0)
Platelets: 159 10*3/uL (ref 150–400)
RBC: 3.17 MIL/uL — ABNORMAL LOW (ref 3.87–5.11)
RDW: 17 % — ABNORMAL HIGH (ref 11.5–15.5)
WBC: 6.4 10*3/uL (ref 4.0–10.5)
nRBC: 0 % (ref 0.0–0.2)

## 2023-06-09 LAB — COMPREHENSIVE METABOLIC PANEL
ALT: 13 U/L (ref 0–44)
AST: 24 U/L (ref 15–41)
Albumin: 3.4 g/dL — ABNORMAL LOW (ref 3.5–5.0)
Alkaline Phosphatase: 100 U/L (ref 38–126)
Anion gap: 9 (ref 5–15)
BUN: 30 mg/dL — ABNORMAL HIGH (ref 8–23)
CO2: 17 mmol/L — ABNORMAL LOW (ref 22–32)
Calcium: 8.9 mg/dL (ref 8.9–10.3)
Chloride: 110 mmol/L (ref 98–111)
Creatinine, Ser: 1.99 mg/dL — ABNORMAL HIGH (ref 0.44–1.00)
GFR, Estimated: 27 mL/min — ABNORMAL LOW (ref >60)
Glucose, Bld: 238 mg/dL — ABNORMAL HIGH (ref 70–99)
Potassium: 4.2 mmol/L (ref 3.5–5.1)
Sodium: 136 mmol/L (ref 135–145)
Total Bilirubin: 1.2 mg/dL — ABNORMAL HIGH (ref <1.2)
Total Protein: 6.7 g/dL (ref 6.5–8.1)

## 2023-06-09 LAB — URINALYSIS, ROUTINE W REFLEX MICROSCOPIC
Bacteria, UA: NONE SEEN
Bilirubin Urine: NEGATIVE
Glucose, UA: >=500 mg/dL — AB
Hgb urine dipstick: NEGATIVE
Ketones, ur: NEGATIVE mg/dL
Leukocytes,Ua: NEGATIVE
Nitrite: NEGATIVE
Protein, ur: 100 mg/dL — AB
Specific Gravity, Urine: 1.014 (ref 1.005–1.030)
pH: 5 (ref 5.0–8.0)

## 2023-06-09 LAB — CBG MONITORING, ED: Glucose-Capillary: 225 mg/dL — ABNORMAL HIGH (ref 70–99)

## 2023-06-09 MED ORDER — LIDOCAINE 5 % EX PTCH
1.0000 | MEDICATED_PATCH | CUTANEOUS | Status: DC
  Administered 2023-06-09: 1 via TRANSDERMAL
  Filled 2023-06-09: qty 1

## 2023-06-09 MED ORDER — CETIRIZINE HCL 10 MG PO TABS: 10.0000 mg | ORAL_TABLET | Freq: Every day | ORAL | 0 refills | Status: DC

## 2023-06-09 NOTE — ED Notes (Signed)
RN provided patient with lemon lime no sugar shasta and gatorlyte and encouraged to hydrate.

## 2023-06-09 NOTE — ED Provider Notes (Signed)
[place] EMERGENCY DEPARTMENT AT [facility] Provider Note   CSN: [id number] Arrival date & time: 06/09/23  1558     History  Chief Complaint  Patient presents with   Loss of Consciousness   Hyperglycemia    Erica [name] is a 67 y.o. female with a history of rectal cancer, stroke, interstitial lung disease, and diabetes mellitus presents the ED today for near-syncopal event.  Patient reports generalized weakness for the past several days.  She states that she had a runny nose this morning and took 2 Benadryl tablets.  Her son came over later that morning and he said she was kind of "swaying side-to-side" and was not able to stand up for long periods of time.  Patient's son states that she started leaning forward he ran to grab her cane and she still was having trouble getting her balance so he grabbed her and help her sit on the floor, leaning against the refrigerator.  She did not hit her head.  Son states that she was acting confused for several minutes but did not lose consciousness.  She denies any vision changes, shortness of breath, chest pain, or headaches. No focal neuro deficits. She endorses feeling tired as well as right lateral neck tenderness but son believes it was due to the way she was leaning against the refrigerator.  Blood pressure with EMS was 117/58 when she stood up.  She does have a history of orthostatic hypotension.  Additionally, patient reports that her primary care provider recently took her off for her Toujeo and started her on Farxiga.  She states that her blood sugars been in the 300s for the past several days.  No additional complaints or concerns at this time.    Home Medications Prior to Admission medications   Medication Sig Start Date End Date Taking? Authorizing Provider  cetirizine (ZYRTEC) 10 MG tablet Take 1 tablet (10 mg total) by mouth daily. 06/09/23 07/09/23 Yes Maxwell Marion, PA-C  acetaminophen (TYLENOL) 325 MG tablet Take 2 tablets  (650 mg total) by mouth 2 (two) times daily. 03/15/23   Love, Evlyn Kanner, PA-C  albuterol (VENTOLIN HFA) 108 (90 Base) MCG/ACT inhaler Inhale 2 puffs into the lungs every 4 (four) hours as needed for wheezing. 11/20/20   Angiulli, Mcarthur Rossetti, PA-C  amLODipine (NORVASC) 10 MG tablet Take 1 tablet (10 mg total) by mouth daily. 03/14/23   Love, Evlyn Kanner, PA-C  clopidogrel (PLAVIX) 75 MG tablet Take 1 tablet (75 mg total) by mouth daily. 05/16/23   Ihor Austin, NP  cyanocobalamin 1000 MCG tablet Take 1 tablet (1,000 mcg total) by mouth daily for 30 days then as directed by MD 03/14/23 07/23/23  Love, Evlyn Kanner, PA-C  diclofenac Sodium (VOLTAREN) 1 % GEL SMARTSIG:4 Gram(s) Topical 4 Times Daily PRN 08/20/22   [provider]  FARXIGA 10 MG TABS tablet Take 10 mg by mouth daily. 05/14/23   [provider]  gabapentin (NEURONTIN) 100 MG capsule Take 1 capsule (100 mg total) by mouth 2 (two) times daily. Patient is taking as needed 03/14/23   Love, Evlyn Kanner, PA-C  HYDROcodone-acetaminophen (NORCO/VICODIN) 5-325 MG tablet Take 1 tablet by mouth every 6 (six) hours as needed. 02/23/23   [provider]  Insulin Pen Needle (PEN NEEDLES) 32G X 6 MM MISC 1 application by Does not apply route 2 (two) times daily. 11/20/20   Love, Evlyn Kanner, PA-C  loratadine (CLARITIN) 10 MG tablet Take 1 tablet (10 mg total) by  mouth daily as needed for allergies. 03/21/23   Marcelino Duster, MD  naphazoline-glycerin (CLEAR EYES REDNESS) 0.012-0.25 % SOLN Place 1-2 drops into both eyes 3 (three) times daily with meals. 03/14/23   Love, Evlyn Kanner, PA-C  pantoprazole (PROTONIX) 40 MG tablet Take 40 mg by mouth daily.    [provider]  pioglitazone (ACTOS) 15 MG tablet Take 15 mg by mouth daily.    [provider]  Pirfenidone 801 MG TABS TAKE 1 TABLET (801 MG) BY MOUTH WITH BREAKFAST, WITH LUNCH, AND WITH EVENING MEAL. Patient taking differently: Take 1 tablet by mouth daily. Patient is taking 1  tablet daily current with breakfast 08/22/22   Kalman Shan, MD  rosuvastatin (CRESTOR) 40 MG tablet Take 1 tablet (40 mg total) by mouth daily. 03/14/23 04/13/23  Love, Evlyn Kanner, PA-C  sacubitril-valsartan (ENTRESTO) 49-51 MG Take 1 tablet by mouth 2 (two) times daily. 03/14/23   Love, Evlyn Kanner, PA-C  sertraline (ZOLOFT) 50 MG tablet Take 1 tablet (50 mg total) by mouth daily. 03/14/23   Love, Evlyn Kanner, PA-C  SYMBICORT 160-4.5 MCG/ACT inhaler INHALE 2 PUFFS INTO THE LUNGS TWICE DAILY 10/18/21   Kalman Shan, MD  traZODone (DESYREL) 100 MG tablet Take 1 tablet (100 mg total) by mouth at bedtime. 03/14/23   Love, Evlyn Kanner, PA-C  triamcinolone cream (KENALOG) 0.1 % Apply 1 Application topically 2 (two) times daily.    [provider]  venlafaxine XR (EFFEXOR-XR) 150 MG 24 hr capsule Take 1 capsule (150 mg total) by mouth daily. 03/14/23   Love, Evlyn Kanner, PA-C  Vitamin D, Ergocalciferol, (DRISDOL) 1.25 MG (50000 UNIT) CAPS capsule Take 50,000 Units by mouth once a week. 05/04/23   [provider]      Allergies    Penicillins    Review of Systems   Review of Systems  Cardiovascular:  Positive for syncope.  Neurological:  Positive for weakness.  All other systems reviewed and are negative.   Physical Exam Updated Vital Signs BP (!) 129/105 (BP Location: Left Arm)   Pulse 67   Temp 97.6 F (36.4 C) (Oral)   Resp 18   Ht 5\' 2"  (1.575 m)   Wt 72.6 kg   SpO2 100%   BMI 29.26 kg/m  Physical Exam Vitals and nursing note reviewed.  Constitutional:      Appearance: Normal appearance.  HENT:     Head: Normocephalic and atraumatic.     Mouth/Throat:     Mouth: Mucous membranes are moist.  Eyes:     Conjunctiva/sclera: Conjunctivae normal.     Pupils: Pupils are equal, round, and reactive to light.  Cardiovascular:     Rate and Rhythm: Normal rate and regular rhythm.     Pulses: Normal pulses.     Heart sounds: Normal heart sounds.  Pulmonary:     Effort:  Pulmonary effort is normal.     Breath sounds: Normal breath sounds.  Abdominal:     Palpations: Abdomen is soft.     Tenderness: There is no abdominal tenderness.  Skin:    General: Skin is warm and dry.     Findings: No rash.  Neurological:     General: No focal deficit present.     Mental Status: She is alert.  Psychiatric:        Mood and Affect: Mood normal.        Behavior: Behavior normal.     Orthostatic Lying BP- Lying: 128/68 Pulse- Lying: 67 Orthostatic Sitting  BP- Sitting: 116/75 Pulse- Sitting: 77 Orthostatic Standing at 0 minutes BP- Standing at 0 minutes: 123/72 Pulse- Standing at 0 minutes: 77   ED Results / Procedures / Treatments   Labs (all labs ordered are listed, but only abnormal results are displayed) Labs Reviewed  COMPREHENSIVE METABOLIC PANEL - Abnormal; Notable for the following components:      Result Value   CO2 17 (*)    Glucose, Bld 238 (*)    BUN 30 (*)    Creatinine, Ser 1.99 (*)    Albumin 3.4 (*)    Total Bilirubin 1.2 (*)    GFR, Estimated 27 (*)    All other components within normal limits  CBC - Abnormal; Notable for the following components:   RBC 3.17 (*)    Hemoglobin 10.0 (*)    HCT 28.3 (*)    RDW 17.0 (*)    All other components within normal limits  URINALYSIS, ROUTINE W REFLEX MICROSCOPIC - Abnormal; Notable for the following components:   Glucose, UA >=500 (*)    Protein, ur 100 (*)    All other components within normal limits  CBG MONITORING, ED - Abnormal; Notable for the following components:   Glucose-Capillary 225 (*)    All other components within normal limits    EKG None  Radiology CT Head Wo Contrast Result Date: 06/09/2023 CLINICAL DATA:  Syncope.  Neck pain. EXAM: CT HEAD WITHOUT CONTRAST CT CERVICAL SPINE WITHOUT CONTRAST TECHNIQUE: Multidetector CT imaging of the head and cervical spine was performed following the standard protocol without intravenous contrast. Multiplanar CT image  reconstructions of the cervical spine were also generated. RADIATION DOSE REDUCTION: This exam was performed according to the departmental dose-optimization program which includes automated exposure control, adjustment of the mA and/or kV according to patient size and/or use of iterative reconstruction technique. COMPARISON:  Head CT dated 03/18/2023. FINDINGS: CT HEAD FINDINGS Brain: Mild age-related atrophy and chronic microvascular ischemic changes. Old bilateral cerebellar infarcts as well as subcortical infarcts in the centrum semiovale. There is no acute intracranial hemorrhage. No mass effect or midline shift. No extra-axial fluid collection. Vascular: No hyperdense vessel or unexpected calcification. Skull: Normal. Negative for fracture or focal lesion. Sinuses/Orbits: No acute finding. Other: None CT CERVICAL SPINE FINDINGS Alignment: No acute subluxation. Skull base and vertebrae: No acute fracture. Soft tissues and spinal canal: No prevertebral fluid or swelling. No visible canal hematoma. Disc levels:  No acute findings.  Degenerative changes. Upper chest: Emphysema. Other: None IMPRESSION: 1. No acute intracranial hemorrhage. Mild age-related atrophy and chronic microvascular ischemic changes. Old bilateral cerebellar infarcts. 2. No acute/traumatic cervical spine pathology. Electronically Signed   By: Elgie Collard M.D.   On: 06/09/2023 19:43   CT Cervical Spine Wo Contrast Result Date: 06/09/2023 CLINICAL DATA:  Syncope.  Neck pain. EXAM: CT HEAD WITHOUT CONTRAST CT CERVICAL SPINE WITHOUT CONTRAST TECHNIQUE: Multidetector CT imaging of the head and cervical spine was performed following the standard protocol without intravenous contrast. Multiplanar CT image reconstructions of the cervical spine were also generated. RADIATION DOSE REDUCTION: This exam was performed according to the departmental dose-optimization program which includes automated exposure control, adjustment of the mA and/or kV  according to patient size and/or use of iterative reconstruction technique. COMPARISON:  Head CT dated 03/18/2023. FINDINGS: CT HEAD FINDINGS Brain: Mild age-related atrophy and chronic microvascular ischemic changes. Old bilateral cerebellar infarcts as well as subcortical infarcts in the centrum semiovale. There is no acute intracranial hemorrhage. No mass effect or  midline shift. No extra-axial fluid collection. Vascular: No hyperdense vessel or unexpected calcification. Skull: Normal. Negative for fracture or focal lesion. Sinuses/Orbits: No acute finding. Other: None CT CERVICAL SPINE FINDINGS Alignment: No acute subluxation. Skull base and vertebrae: No acute fracture. Soft tissues and spinal canal: No prevertebral fluid or swelling. No visible canal hematoma. Disc levels:  No acute findings.  Degenerative changes. Upper chest: Emphysema. Other: None IMPRESSION: 1. No acute intracranial hemorrhage. Mild age-related atrophy and chronic microvascular ischemic changes. Old bilateral cerebellar infarcts. 2. No acute/traumatic cervical spine pathology. Electronically Signed   By: Elgie Collard M.D.   On: 06/09/2023 19:43    Procedures Procedures: not indicated.   Medications Ordered in ED Medications  lidocaine (LIDODERM) 5 % 1 patch (1 patch Transdermal Patch Applied 06/09/23 1743)    ED Course/ Medical Decision Making/ A&P                                 Medical Decision Making Amount and/or Complexity of Data Reviewed Labs: ordered. Radiology: ordered.  Risk OTC drugs. Prescription drug management.   This patient presents to the ED for concern of generalized weakness & presyncope, this involves an extensive number of treatment options, and is a complaint that carries with it a high risk of complications and morbidity.   Differential diagnosis includes: stroke, cardiac arrhythmia, dehydration, electrolyte derangement, orthostatic hypotension, etc.   Comorbidities  See HPI  above   Additional History  Additional history obtained from prior records.   Cardiac Monitoring / EKG  The patient was maintained on a cardiac monitor.  I personally viewed and interpreted the cardiac monitored which showed: NSR with LAD, heart rate of 670 1bpm.   Lab Tests  I ordered and personally interpreted labs.  The pertinent results include:   BUN of 30, Cr of 1.99, otherwise CMP and CBC are within normal limits. UA shows greater than 500 glucose and 100 protein otherwise within normal limits   Imaging Studies  I ordered imaging studies including CT head and cervical spine  I independently visualized and interpreted imaging which showed:  No acute intracranial hemorrhage.  Mild age related atrophy and chronic microvascular ischemic changes.  Old bilateral cerebellar infarcts. No acute/traumatic cervical spine pathology. I agree with the radiologist interpretation   Problem List / ED Course / Critical Interventions / Medication Management  Near-syncope I ordered medications including: Lidocaine patch for neck pain Oral hydration  Reevaluation of the patient after these medicines showed that the patient improved I have reviewed the patients home medicines and have made adjustments as needed Patient able to ambulate independently to get to the bathroom to provide urine sample. After fluids, patient reports feeling better and less sleepy. Discussed findings with patient and son at bedside.  All questions were answered.   Social Determinants of Health  Tobacco use   Test / Admission - Considered  Patient is stable and safe for discharge home. Advised close PCP follow up. Return precautions given.        Final Clinical Impression(s) / ED Diagnoses Final diagnoses:  Near syncope  Generalized weakness    Rx / DC Orders ED Discharge Orders          Ordered    cetirizine (ZYRTEC) 10 MG tablet  Daily        06/09/23 2025              Maxwell Marion, PA-C  06/09/23 2314    Rondel Baton, MD 06/10/23 1440

## 2023-06-09 NOTE — ED Triage Notes (Signed)
Patient was BIB [facility] from home. EMS said they were called out initially for syncope episode and blood pressure was 117/58 when she stood up. History of orthostatic hypotension. Patient was lowered to the floor by her son. She did not hit her head, but not sure how long she was out. When EMS got there patient was alert and awake. EMS reported she took Benadryl 50 mg at 15:10 before they got there. EMS Patient blood sugar was 300 and she was told patient was taken off her insulin for a month and was placed on Farxiga around 4 to 5 weeks. Patient blood sugar been in the 300's for 2 days. Patient c/o feeling weak and sleepy. EMS reported her stroke screen was negative. She was given 250 Normal saline by EMS. A & O x4. Vital signs: BP 130/80, RR 25, CO2 25, HR 70, saturation 98% RA.

## 2023-06-09 NOTE — Discharge Instructions (Addendum)
As discussed, your labs and imaging today were reassuring. Make sure you're staying hydrated as this can help with your symptoms.  I have sent a prescription of cetirizine to your pharmacy.  You can take this once a day as needed if you continue to have a runny nose.  Follow-up with your primary care provider in the next several days for reevaluation of your symptoms.  Get help right away if: You pass out or faint. You have any of these symptoms: Fast or uneven heartbeats (palpitations). Pain in your chest, belly, or back. Shortness of breath. You have a seizure. You have a very bad headache. You are confused. You have trouble seeing. You are very weak. You have trouble walking. You are bleeding from your mouth or butt. You have black or tarry poop (stool).

## 2023-06-12 ENCOUNTER — Ambulatory Visit: Payer: Medicare HMO | Admitting: Physical Therapy

## 2023-06-12 ENCOUNTER — Ambulatory Visit: Payer: Medicare HMO | Admitting: Occupational Therapy

## 2023-06-12 NOTE — Therapy (Deleted)
OUTPATIENT OCCUPATIONAL THERAPY NEURO EVALUATION  Patient Name: Erica [name] MRN: [id number] DOB:[birth year], 67 y.o., female Today's Date: 06/12/2023  PCP: Estevan Oaks, NP  REFERRING PROVIDER: Ihor Austin, NP   END OF SESSION:   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   GERD (gastroesophageal reflux disease)    with chronic cough   Headache(784.0)    "mild"   Hypertension    Loop Biotronik loop implant 12/16/2020 12/16/2020   Mental disorder    depression   Neuropathy    feet    Pain    arthritis pain - takes tramadol as needed   Pulmonary fibrosis, unspecified (HCC)    Rectal cancer (HCC)    Rectal polyp    very little bleeding with bowel movements- no pain   Stroke (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   Suicide attempt [facility])    Past Surgical History:  Procedure Laterality Date   ANAL RECTAL MANOMETRY N/A 07/13/2016   Procedure: ANO RECTAL MANOMETRY;  Surgeon: Romie Levee, MD;  [facility];  Service: Endoscopy;  Laterality: N/A;   CESAREAN SECTION     EUS N/A 11/21/2012   Procedure: LOWER ENDOSCOPIC ULTRASOUND (EUS);  Surgeon: Willis Modena, MD;  Location: [facility];  Service: Endoscopy;  Laterality: N/A;   FLEXIBLE SIGMOIDOSCOPY N/A 11/21/2012   Procedure: FLEXIBLE SIGMOIDOSCOPY;  Surgeon: Willis Modena, MD;  [facility];  Service: Endoscopy;  Laterality: N/A;   FLEXIBLE SIGMOIDOSCOPY N/A 01/28/2013   Procedure: FLEXIBLE SIGMOIDOSCOPY;  Surgeon: Romie Levee, MD;  [facility];  Service: Endoscopy;  Laterality: N/A;   LAPAROSCOPIC LOW ANTERIOR RESECTION N/A 01/29/2013   Procedure: LAPAROSCOPIC LOW ANTERIOR RESECTION, Rigid Proctoscopy;  Surgeon: Romie Levee, MD;  [facility];  Service: General;  Laterality: N/A;   LAPAROSCOPIC SIGMOID COLECTOMY N/A 11/14/2012    Procedure: DIAGNOSTIC LAPAROSCOPY AND SIGMOIDMOIDOSCOPY ;  Surgeon: Emelia Loron, MD;  Location[facility];  Service: General;  Laterality: N/A;   RECTAL ULTRASOUND N/A 07/13/2016   Procedure: RECTAL ULTRASOUND;  Surgeon: Romie Levee, MD;  [facility];  Service: Endoscopy;  Laterality: N/A;   TONSILLECTOMY     TONSILLECTOMY AND ADENOIDECTOMY     Patient Active Problem List   Diagnosis Date Noted   Syncope 03/18/2023   Stroke (cerebrum) (HCC) 03/08/2023   Acute ischemic stroke (HCC) 03/05/2023   Stroke-like symptom 03/04/2023   Research study patient 07/15/2022   Encounter for loop recorder check 01/02/2021   Loop Biotronik loop implant 12/16/2020 12/16/2020   Leukopenia    Controlled type 2 diabetes mellitus with hyperglycemia, with long-term current use of insulin (HCC)    Vascular headache    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   Sinus bradycardia on ECG 11/03/2020   Benign essential HTN    Pancytopenia (HCC)    Stage 3b chronic kidney disease (HCC)    Anemia    Labile blood glucose    Labile blood pressure    Diabetic peripheral neuropathy (HCC)    Essential hypertension    AKI (acute kidney injury) (HCC)    Ataxia, late effect of cerebrovascular disease    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   Acute CVA (cerebrovascular accident) (HCC) 06/26/2020   CVA (cerebral vascular accident) (HCC) 06/25/2020   BMI 32.0-32.9,adult 06/03/2020  Left upper lobe pneumonia 03/30/2020   Chest pain 10/01/2019   MDD (major depressive disorder), recurrent severe, 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   Suicide attempt (HCC) 05/09/2019   Cocaine abuse (HCC) 05/09/2019   Interstitial lung disease (HCC) 06/21/2016   COPD (chronic obstructive pulmonary disease) (HCC) 06/21/2016   Cough 06/21/2016   GERD (gastroesophageal reflux disease) 06/21/2016   Hypersomnia 06/21/2016   Tobacco user 06/21/2016   Rectal cancer (HCC) 01/30/2013     ONSET DATE: 05/16/2023 (referral date)  REFERRING DIAG:  I63.9 (ICD-10-CM) - Recurrent strokes (HCC)  I69.354 (ICD-10-CM) - Hemiparesis affecting left side as late effect of cerebrovascular accident (HCC)  I69.398,R26.9 (ICD-10-CM) - Gait disturbance, post-stroke    THERAPY DIAG:  No diagnosis found.  Rationale for Evaluation and Treatment: Rehabilitation  SUBJECTIVE:   SUBJECTIVE STATEMENT: *** Pt accompanied by: {accompnied:27141}  PERTINENT HISTORY: history of rectal cancer, stroke, interstitial lung disease, and diabetes mellitus, tobacco use  Per 05/16/23 Office Visit: HPI: "She presented to [facility] ED with altered mental status on 03/04/2023. CTH negative for acute stroke.  MRI brain showed small acute to subacute bilateral centrum semiovale infarcts.  MRA brain normal.  Carotid duplex less than 50% stenosis bilateral ICAs, minimal calcified plaque at left ICA origin.  EF 65 to 70%.  Evaluated by neurology team, suspected infarcts bilateral small vessel disease.... She was discharged to CIR.  She returned to ED 10/5 after syncopal episode.  Noted orthostatic hypotension likely contributing to syncopal event.  CTH negative. CTA head/neck minimal arthrosclerotic calcifications of bilateral ICAs without significant stenosis otherwise unremarkable.  MRI brain showed acute left thalamic infarct and multiple chronic infarcts including bilateral occipital lobe infarcts, watershed territory infarcts and diffuse subcortical white matter disease, and bilateral basal ganglia and cerebellar infarcts.  She was seen by the neurology team who felt etiology likely small vessel disease... Loop recorder interrogated without evidence of A-fib.  She was discharged with home health therapies... Currently, she reports being stable since discharge.  She reports residual difficulty with speech, worsening left-sided weakness weakness (compared to baseline from prior stroke) and some imbalance. Has not been able to  start therapy due to difficulty scheduling.  Ambulates with cane, no recent falls. Lives in own home but does have son and sister to assist as needed.  She does not drive, was brought to todays visit by family, waiting in waiting room. Denies new stroke/TIA symptoms... She does report excessive daytime fatigue even prior to recent strokes."    PRECAUTIONS: {Therapy precautions:24002}  WEIGHT BEARING RESTRICTIONS: {Yes ***/No:24003}  PAIN:  Are you having pain? {OPRCPAIN:27236}  FALLS: Has patient fallen in last 6 months? {fallsyesno:27318}  LIVING ENVIRONMENT: Lives with: {OPRC lives with:25569::"lives with their family"} Lives in: {Lives in:25570} Stairs: {opstairs:27293} Has following equipment at home: {Assistive devices:23999}  PLOF: {PLOF:24004}  PATIENT GOALS: ***  OBJECTIVE:  Note: Objective measures were completed at Evaluation unless otherwise noted.  HAND DOMINANCE: {MISC; OT HAND [phone]}  ADLs: Overall ADLs: *** Transfers/ambulation related to ADLs: Eating: *** Grooming: *** UB Dressing: *** LB Dressing: *** Toileting: *** Bathing: *** Tub Shower transfers: *** Equipment: {equipment:25573}  IADLs: Shopping: *** Light housekeeping: *** Meal Prep: *** Community mobility: *** Medication management: *** Financial management: *** Handwriting: {OTWRITTENEXPRESSION:25361}  MOBILITY STATUS: {OTMOBILITY:25360}  POSTURE COMMENTS:  {posture:25561} Sitting balance: {sitting balance:25483}  ACTIVITY TOLERANCE: Activity tolerance: ***  FUNCTIONAL OUTCOME MEASURES: {OTFUNCTIONALMEASURES:27238}  UPPER EXTREMITY ROM:    {AROM/PROM:27142} ROM Right eval Left eval  Shoulder flexion    Shoulder abduction  Shoulder adduction    Shoulder extension    Shoulder internal rotation    Shoulder external rotation    Elbow flexion    Elbow extension    Wrist flexion    Wrist extension    Wrist ulnar deviation    Wrist radial deviation    Wrist  pronation    Wrist supination    (Blank rows = not tested)  UPPER EXTREMITY MMT:     MMT Right eval Left eval  Shoulder flexion    Shoulder abduction    Shoulder adduction    Shoulder extension    Shoulder internal rotation    Shoulder external rotation    Middle trapezius    Lower trapezius    Elbow flexion    Elbow extension    Wrist flexion    Wrist extension    Wrist ulnar deviation    Wrist radial deviation    Wrist pronation    Wrist supination    (Blank rows = not tested)  HAND FUNCTION: {handfunction:27230}  COORDINATION: {otcoordination:27237}  SENSATION: {sensation:27233}  EDEMA: ***  MUSCLE TONE: {UETONE:25567}  COGNITION: Overall cognitive status: {cognition:24006}  VISION: Subjective report: *** Baseline vision: {OTBASELINEVISION:25363} Visual history: {OTVISUALHISTORY:25364}  VISION ASSESSMENT: {visionassessment:27231}  Patient has difficulty with following activities due to following visual impairments: ***  PERCEPTION: {Perception:25564}  PRAXIS: {Praxis:25565}  OBSERVATIONS: ***                                                                                                                             TREATMENT DATE: ***         PATIENT EDUCATION: Education details: *** Person educated: {Person educated:25204} Education method: {Education Method:25205} Education comprehension: {Education Comprehension:25206}  HOME EXERCISE PROGRAM: ***   GOALS: Goals reviewed with patient? {yes/no:20286}  SHORT TERM GOALS: Target date: ***  *** Baseline: Goal status: INITIAL  2.  *** Baseline:  Goal status: INITIAL  3.  *** Baseline:  Goal status: INITIAL  4.  *** Baseline:  Goal status: INITIAL  5.  *** Baseline:  Goal status: INITIAL  6.  *** Baseline:  Goal status: INITIAL  LONG TERM GOALS: Target date: ***  *** Baseline:  Goal status: INITIAL  2.  *** Baseline:  Goal status: INITIAL  3.   *** Baseline:  Goal status: INITIAL  4.  *** Baseline:  Goal status: INITIAL  5.  *** Baseline:  Goal status: INITIAL  6.  *** Baseline:  Goal status: INITIAL  ASSESSMENT:  CLINICAL IMPRESSION: Patient is a *** y.o. *** who was seen today for occupational therapy evaluation for ***.   PERFORMANCE DEFICITS: in functional skills including {OT physical skills:25468}, cognitive skills including {OT cognitive skills:25469}, and psychosocial skills including {OT psychosocial skills:25470}.   IMPAIRMENTS: are limiting patient from {OT performance deficits:25471}.   CO-MORBIDITIES: {Comorbidities:25485} that affects occupational performance. Patient will benefit from skilled OT to address above impairments and improve overall function.  MODIFICATION OR ASSISTANCE TO COMPLETE EVALUATION: {  OT modification:25474}  OT OCCUPATIONAL PROFILE AND HISTORY: {OT PROFILE AND HISTORY:25484}  CLINICAL DECISION MAKING: {OT CDM:25475}  REHAB POTENTIAL: {rehabpotential:25112}  EVALUATION COMPLEXITY: {Evaluation complexity:25115}    PLAN:  OT FREQUENCY: {rehab frequency:25116}  OT DURATION: {rehab duration:25117}  PLANNED INTERVENTIONS: {OT Interventions:25467}  RECOMMENDED OTHER SERVICES: ***  CONSULTED AND AGREED WITH PLAN OF CARE: {WUJ:81191}  PLAN FOR NEXT SESSION: ***   Wynetta Emery, OT 06/12/2023, 7:52 AM

## 2023-06-19 ENCOUNTER — Ambulatory Visit: Payer: Medicare HMO | Attending: Nurse Practitioner

## 2023-06-19 ENCOUNTER — Ambulatory Visit: Payer: Medicare HMO | Admitting: Occupational Therapy

## 2023-06-19 ENCOUNTER — Ambulatory Visit: Payer: Medicare HMO

## 2023-06-21 ENCOUNTER — Inpatient Hospital Stay (HOSPITAL_BASED_OUTPATIENT_CLINIC_OR_DEPARTMENT_OTHER): Payer: Medicare HMO | Admitting: Family

## 2023-06-21 ENCOUNTER — Inpatient Hospital Stay: Payer: Medicare HMO | Attending: Hematology & Oncology

## 2023-06-21 VITALS — BP 127/67 | HR 67 | Temp 97.9°F | Resp 17 | Ht 62.0 in | Wt 159.8 lb

## 2023-06-21 DIAGNOSIS — I6310 Cerebral infarction due to embolism of unspecified precerebral artery: Secondary | ICD-10-CM

## 2023-06-21 DIAGNOSIS — Z823 Family history of stroke: Secondary | ICD-10-CM

## 2023-06-21 DIAGNOSIS — Z801 Family history of malignant neoplasm of trachea, bronchus and lung: Secondary | ICD-10-CM | POA: Insufficient documentation

## 2023-06-21 DIAGNOSIS — Z7982 Long term (current) use of aspirin: Secondary | ICD-10-CM | POA: Diagnosis not present

## 2023-06-21 DIAGNOSIS — Z85048 Personal history of other malignant neoplasm of rectum, rectosigmoid junction, and anus: Secondary | ICD-10-CM | POA: Diagnosis not present

## 2023-06-21 DIAGNOSIS — Z87891 Personal history of nicotine dependence: Secondary | ICD-10-CM | POA: Diagnosis not present

## 2023-06-21 DIAGNOSIS — J441 Chronic obstructive pulmonary disease with (acute) exacerbation: Secondary | ICD-10-CM | POA: Insufficient documentation

## 2023-06-21 DIAGNOSIS — R531 Weakness: Secondary | ICD-10-CM

## 2023-06-21 DIAGNOSIS — Z800 Family history of malignant neoplasm of digestive organs: Secondary | ICD-10-CM

## 2023-06-21 DIAGNOSIS — M21372 Foot drop, left foot: Secondary | ICD-10-CM | POA: Insufficient documentation

## 2023-06-21 DIAGNOSIS — E119 Type 2 diabetes mellitus without complications: Secondary | ICD-10-CM | POA: Insufficient documentation

## 2023-06-21 DIAGNOSIS — Z8042 Family history of malignant neoplasm of prostate: Secondary | ICD-10-CM | POA: Insufficient documentation

## 2023-06-21 DIAGNOSIS — Z809 Family history of malignant neoplasm, unspecified: Secondary | ICD-10-CM

## 2023-06-21 DIAGNOSIS — Z7984 Long term (current) use of oral hypoglycemic drugs: Secondary | ICD-10-CM | POA: Insufficient documentation

## 2023-06-21 DIAGNOSIS — D6859 Other primary thrombophilia: Secondary | ICD-10-CM

## 2023-06-21 DIAGNOSIS — Z8673 Personal history of transient ischemic attack (TIA), and cerebral infarction without residual deficits: Secondary | ICD-10-CM

## 2023-06-21 DIAGNOSIS — Z79899 Other long term (current) drug therapy: Secondary | ICD-10-CM | POA: Diagnosis not present

## 2023-06-21 DIAGNOSIS — Z808 Family history of malignant neoplasm of other organs or systems: Secondary | ICD-10-CM

## 2023-06-21 LAB — CMP (CANCER CENTER ONLY)
ALT: 8 U/L (ref 0–44)
AST: 14 U/L — ABNORMAL LOW (ref 15–41)
Albumin: 3.8 g/dL (ref 3.5–5.0)
Alkaline Phosphatase: 97 U/L (ref 38–126)
Anion gap: 8 (ref 5–15)
BUN: 26 mg/dL — ABNORMAL HIGH (ref 8–23)
CO2: 25 mmol/L (ref 22–32)
Calcium: 8.6 mg/dL — ABNORMAL LOW (ref 8.9–10.3)
Chloride: 105 mmol/L (ref 98–111)
Creatinine: 1.62 mg/dL — ABNORMAL HIGH (ref 0.44–1.00)
GFR, Estimated: 34 mL/min — ABNORMAL LOW (ref >60)
Glucose, Bld: 285 mg/dL — ABNORMAL HIGH (ref 70–99)
Potassium: 4.2 mmol/L (ref 3.5–5.1)
Sodium: 138 mmol/L (ref 135–145)
Total Bilirubin: 0.8 mg/dL (ref 0.0–1.2)
Total Protein: 6.7 g/dL (ref 6.5–8.1)

## 2023-06-21 LAB — CBC WITH DIFFERENTIAL (CANCER CENTER ONLY)
Abs Immature Granulocytes: 0.01 10*3/uL (ref 0.00–0.07)
Basophils Absolute: 0 10*3/uL (ref 0.0–0.1)
Basophils Relative: 0 %
Eosinophils Absolute: 0.1 10*3/uL (ref 0.0–0.5)
Eosinophils Relative: 1 %
HCT: 29.8 % — ABNORMAL LOW (ref 36.0–46.0)
Hemoglobin: 10.7 g/dL — ABNORMAL LOW (ref 12.0–15.0)
Immature Granulocytes: 0 %
Lymphocytes Relative: 20 %
Lymphs Abs: 0.8 10*3/uL (ref 0.7–4.0)
MCH: 32.8 pg (ref 26.0–34.0)
MCHC: 35.9 g/dL (ref 30.0–36.0)
MCV: 91.4 fL (ref 80.0–100.0)
Monocytes Absolute: 0.2 10*3/uL (ref 0.1–1.0)
Monocytes Relative: 6 %
Neutro Abs: 3.1 10*3/uL (ref 1.7–7.7)
Neutrophils Relative %: 73 %
Platelet Count: 157 10*3/uL (ref 150–400)
RBC: 3.26 MIL/uL — ABNORMAL LOW (ref 3.87–5.11)
RDW: 16.2 % — ABNORMAL HIGH (ref 11.5–15.5)
WBC Count: 4.2 10*3/uL (ref 4.0–10.5)
nRBC: 0 % (ref 0.0–0.2)

## 2023-06-21 LAB — APTT: aPTT: 28 s (ref 24–36)

## 2023-06-21 LAB — ANTITHROMBIN III: AntiThromb III Func: 109 % (ref 75–120)

## 2023-06-21 LAB — LACTATE DEHYDROGENASE: LDH: 175 U/L (ref 98–192)

## 2023-06-21 LAB — PROTIME-INR
INR: 1.1 (ref 0.8–1.2)
Prothrombin Time: 14.4 s (ref 11.4–15.2)

## 2023-06-21 NOTE — Progress Notes (Signed)
 Hematology/Oncology Consultation   Name: Erica [name]      MRN: [id number]    Location: Room/bed info not found  Date: 06/21/2023 Time:9:14 AM   REFERRING PHYSICIAN:  Harlene Bogaert, NP  REASON FOR CONSULT: Recurrent stroke   DIAGNOSIS: Recurrent ischemic stroke  HISTORY OF PRESENT ILLNESS: Erica [name] is a very pleasant 68 yo African American female with history of recurrent ischemic stroke.  She had 2 strokes in 2022 (January and May), again in September 2024 and then October 2024.  She is currently off Plavix  and only on aspirin  81 mg PO daily.  She had a LOOP recorder placed in July 2022.  Both her mother and father also have history of stroke. No personal or known familial history of DVT or PE She states that she has never had Covid.  No smoking, ETOH or recreational drug use. She quit smoking 3 years ago after her first stroke.  No hormone replacement therapy.   She is diabetic and currently on Farxiga .  No history of thyroid  disease.  She has 1 son and no history of miscarriage.  Mammogram in 12/2022 was negative.  She has remote history of rectal cancer stage I (T1, N0, cM0) followed by Dr. Lanny. She had rectosigmoid segmental resection on 01/29/2013. Margins were negative. CEA after surgery was elevated but imaging was negative. Last CEA in 08/2017 was 19. Released from Dr. Lanny 08/29/2018.  She is due for colonoscopy but this was put on hold due to stroke in October 2024.  Family cancer history includes maternal uncle with bone and paternal aunt with lung.  She has mild weakness on the left side including foot drop. Tingling noted in the left side as well post stroke.  Tingling in hands with cold.  No swelling in her extremities at this time.  No falls in the last 6 months. No syncope.  She ambulates with a cane at home and a walker when out.  Mild SOB secondary to COPD exacerbation. She takes a break to rest as needed.  No fever, chills, n/v, cough, rash, chest pain, palpitations,  abdominal pain or changes in bowel or bladder habits.  Appetite and hydration are good. Weight is stable at 159 lbs.    ROS: All other 10 point review of systems is negative.   PAST MEDICAL HISTORY:   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   GERD (gastroesophageal reflux disease)    with chronic cough   Headache(784.0)    mild   Hypertension    Loop Biotronik loop implant 12/16/2020 12/16/2020   Mental disorder    depression   Neuropathy    feet    Pain    arthritis pain - takes tramadol  as needed   Pulmonary fibrosis, unspecified (HCC)    Rectal cancer (HCC)    Rectal polyp    very little bleeding with bowel movements- no pain   Stroke (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   Suicide attempt (HCC)     ALLERGIES: Allergies  Allergen Reactions   Penicillins Hives, Itching and Swelling    Tongue swelling      MEDICATIONS:  Current Outpatient Medications on File Prior to Visit  Medication Sig Dispense Refill   acetaminophen  (TYLENOL ) 325 MG tablet Take 2 tablets (650 mg total) by mouth 2 (two)  times daily.     albuterol  (VENTOLIN  HFA) 108 (90 Base) MCG/ACT inhaler Inhale 2 puffs into the lungs every 4 (four) hours as needed for wheezing. 1 g 5   amLODipine  (NORVASC ) 10 MG tablet Take 1 tablet (10 mg total) by mouth daily. 30 tablet 0   cetirizine  (ZYRTEC ) 10 MG tablet Take 1 tablet (10 mg total) by mouth daily. 30 tablet 0   cyanocobalamin  1000 MCG tablet Take 1 tablet (1,000 mcg total) by mouth daily for 30 days then as directed by MD 130 tablet 0   diclofenac  Sodium (VOLTAREN ) 1 % GEL SMARTSIG:4 Gram(s) Topical 4 Times Daily PRN     FARXIGA  10 MG TABS tablet Take 10 mg by mouth daily.     gabapentin  (NEURONTIN ) 100 MG capsule Take 1 capsule (100 mg total) by mouth 2 (two) times daily. Patient is taking as needed 60  capsule 0   HYDROcodone -acetaminophen  (NORCO/VICODIN) 5-325 MG tablet Take 1 tablet by mouth every 6 (six) hours as needed.     loratadine  (CLARITIN ) 10 MG tablet Take 1 tablet (10 mg total) by mouth daily as needed for allergies. 30 tablet 0   naphazoline-glycerin  (CLEAR EYES REDNESS) 0.012-0.25 % SOLN Place 1-2 drops into both eyes 3 (three) times daily with meals.     pantoprazole  (PROTONIX ) 40 MG tablet Take 40 mg by mouth daily.     Pirfenidone  801 MG TABS TAKE 1 TABLET (801 MG) BY MOUTH WITH BREAKFAST, WITH LUNCH, AND WITH EVENING MEAL. (Patient taking differently: Take 1 tablet by mouth daily. Patient is taking 1 tablet daily current with breakfast) 270 tablet 0   sacubitril -valsartan  (ENTRESTO ) 49-51 MG Take 1 tablet by mouth 2 (two) times daily. 60 tablet 0   sertraline  (ZOLOFT ) 50 MG tablet Take 1 tablet (50 mg total) by mouth daily. 30 tablet 0   SYMBICORT  160-4.5 MCG/ACT inhaler INHALE 2 PUFFS INTO THE LUNGS TWICE DAILY 10.2 g 5   traZODone  (DESYREL ) 100 MG tablet Take 1 tablet (100 mg total) by mouth at bedtime. 30 tablet 0   triamcinolone cream (KENALOG) 0.1 % Apply 1 Application topically 2 (two) times daily.     venlafaxine  XR (EFFEXOR -XR) 150 MG 24 hr capsule Take 1 capsule (150 mg total) by mouth daily. 30 capsule 0   Vitamin D , Ergocalciferol , (DRISDOL) 1.25 MG (50000 UNIT) CAPS capsule Take 50,000 Units by mouth once a week.     rosuvastatin  (CRESTOR ) 40 MG tablet Take 1 tablet (40 mg total) by mouth daily. 30 tablet 0   No current facility-administered medications on file prior to visit.     PAST SURGICAL HISTORY Past Surgical History:  Procedure Laterality Date   ANAL RECTAL MANOMETRY N/A 07/13/2016   Procedure: ANO RECTAL MANOMETRY;  Surgeon: Bernarda Ned, MD;  [facility];  Service: Endoscopy;  Laterality: N/A;   CESAREAN SECTION     EUS N/A 11/21/2012   Procedure: LOWER ENDOSCOPIC ULTRASOUND (EUS);  Surgeon: Elsie Cree, MD;  Location: [facility];   Service: Endoscopy;  Laterality: N/A;   FLEXIBLE SIGMOIDOSCOPY N/A 11/21/2012   Procedure: FLEXIBLE SIGMOIDOSCOPY;  Surgeon: Elsie Cree, MD;  [facility];  Service: Endoscopy;  Laterality: N/A;   FLEXIBLE SIGMOIDOSCOPY N/A 01/28/2013   Procedure: FLEXIBLE SIGMOIDOSCOPY;  Surgeon: Bernarda Ned, MD;  [facility];  Service: Endoscopy;  Laterality: N/A;   LAPAROSCOPIC LOW ANTERIOR RESECTION N/A 01/29/2013   Procedure: LAPAROSCOPIC LOW ANTERIOR RESECTION, Rigid Proctoscopy;  Surgeon: Bernarda Ned, MD;  Location: [facility];  Service: General;  Laterality: N/A;   LAPAROSCOPIC SIGMOID COLECTOMY N/A 11/14/2012   Procedure: DIAGNOSTIC LAPAROSCOPY AND SIGMOIDMOIDOSCOPY ;  Surgeon: Donnice Bury, MD;  Location: [facility];  Service: General;  Laterality: N/A;   RECTAL ULTRASOUND N/A 07/13/2016   Procedure: RECTAL ULTRASOUND;  Surgeon: Bernarda Ned, MD;  [facility]: [facility];  Service: Endoscopy;  Laterality: N/A;   TONSILLECTOMY     TONSILLECTOMY AND ADENOIDECTOMY      FAMILY HISTORY: Family History  Problem Relation Age of Onset   Hypertension Father    Stroke Father    Diabetes Father    Heart disease Mother    Hypertension Mother    Hyperlipidemia Mother    Hypertension Sister    Hypertension Brother    Hypertension Sister    Hypertension Brother    Cancer Other 14       colon cancer    Cancer Maternal Aunt        cancer, unknown type    Cancer Maternal Uncle        bone cancer    Cancer Paternal Aunt        lung cancer   Cancer Paternal Uncle        lung cancer   Cancer Maternal Uncle        colon cancer and prostate cancer    Cancer Maternal Uncle        prostate cancer     SOCIAL HISTORY:  reports that she quit smoking about 5 years ago. Her smoking use included cigarettes. She started smoking about 25 years ago. She has a 10 pack-year smoking history. She has never used smokeless tobacco. She reports that she does not currently use alcohol . She reports  that she does not currently use drugs after having used the following drugs: Crack cocaine.  PERFORMANCE STATUS: The patient's performance status is 2 - Symptomatic, <50% confined to bed  PHYSICAL EXAM: Most Recent Vital Signs: Blood pressure 127/67, pulse 67, temperature 97.9 F (36.6 C), temperature source Oral, resp. rate 17, height 5' 2 (1.575 m), weight 159 lb 12.8 oz (72.5 kg), SpO2 100%. BP 127/67 (BP Location: Left Arm, Patient Position: Sitting)   Pulse 67   Temp 97.9 F (36.6 C) (Oral)   Resp 17   Ht 5' 2 (1.575 m)   Wt 159 lb 12.8 oz (72.5 kg)   SpO2 100%   BMI 29.23 kg/m   General Appearance:    Alert, cooperative, no distress, appears stated age  Head:    Normocephalic, without obvious abnormality, atraumatic  Eyes:    PERRL, conjunctiva/corneas clear, EOM's intact, fundi    benign, both eyes  Ears:    Normal TM's and external ear canals, both ears  Nose:   Nares normal, septum midline, mucosa normal, no drainage    or sinus tenderness  Throat:   Lips, mucosa, and tongue normal; teeth and gums normal  Neck:   Supple, symmetrical, trachea midline, no adenopathy;    thyroid :  no enlargement/tenderness/nodules; no carotid   bruit or JVD  Back:     Symmetric, no curvature, ROM normal, no CVA tenderness  Lungs:     Clear to auscultation bilaterally, respirations unlabored  Chest Wall:    No tenderness or deformity   Heart:    Regular rate and rhythm, S1 and S2 normal, no murmur, rub   or gallop  Breast Exam:    No tenderness, masses, or nipple abnormality  Abdomen:     Soft, non-tender, bowel sounds active all four  quadrants,    no masses, no organomegaly  Genitalia:    Normal female without lesion, discharge or tenderness  Rectal:    Normal tone, normal prostate, no masses or tenderness;   guaiac negative stool  Extremities:   Extremities normal, atraumatic, no cyanosis or edema  Pulses:   2+ and symmetric all extremities  Skin:   Skin color, texture, turgor  normal, no rashes or lesions  Lymph nodes:   Cervical, supraclavicular, and axillary nodes normal  Neurologic:   CNII-XII intact, normal strength, sensation and reflexes    throughout    LABORATORY DATA:  Results for orders placed or performed in visit on 06/21/23 (from the past 48 hours)  CBC with Differential (Cancer Center Only)     Status: Abnormal   Collection Time: 06/21/23  8:41 AM  Result Value Ref Range   WBC Count 4.2 4.0 - 10.5 K/uL   RBC 3.26 (L) 3.87 - 5.11 MIL/uL   Hemoglobin 10.7 (L) 12.0 - 15.0 g/dL   HCT 70.1 (L) 63.9 - 53.9 %   MCV 91.4 80.0 - 100.0 fL   MCH 32.8 26.0 - 34.0 pg   MCHC 35.9 30.0 - 36.0 g/dL   RDW 83.7 (H) 88.4 - 84.4 %   Platelet Count 157 150 - 400 K/uL   nRBC 0.0 0.0 - 0.2 %   Neutrophils Relative % 73 %   Neutro Abs 3.1 1.7 - 7.7 K/uL   Lymphocytes Relative 20 %   Lymphs Abs 0.8 0.7 - 4.0 K/uL   Monocytes Relative 6 %   Monocytes Absolute 0.2 0.1 - 1.0 K/uL   Eosinophils Relative 1 %   Eosinophils Absolute 0.1 0.0 - 0.5 K/uL   Basophils Relative 0 %   Basophils Absolute 0.0 0.0 - 0.1 K/uL   Immature Granulocytes 0 %   Abs Immature Granulocytes 0.01 0.00 - 0.07 K/uL    Comment: Performed at [facility] at [facility], [address], [place], [place] [address]      RADIOGRAPHY: No results found.     PATHOLOGY: None   ASSESSMENT/PLAN: Ms. Niswander is a very pleasant 68 yo African American female with history of recurrent ischemic stroke.  Update: Hypercoag panel was negative for an underlying clotting disorder.  Staff message sent to her Cardiologist Dr. Madonna Large to let them know these results and our recommendation that the patient follow-up with neurology and continue Plavix  and baby aspirin  daily to prevent any future strokes.   All questions were answered. The patient knows to call the clinic with any problems, questions or concerns. We can certainly see the patient much sooner if necessary.  The patient  was discussed with Dr. Timmy and he is in agreement with the aforementioned.   Lauraine Pepper, NP   I called and spoke with patient going over her lab results and recommendations and she will reach out to reschedule with neurology. No questions at this time and patient appreciative of call. 06/27/2023 12:44 pm

## 2023-06-22 ENCOUNTER — Telehealth: Payer: Self-pay | Admitting: Adult Health

## 2023-06-22 ENCOUNTER — Telehealth: Payer: Self-pay | Admitting: Neurology

## 2023-06-22 ENCOUNTER — Institutional Professional Consult (permissible substitution): Payer: Medicare HMO | Admitting: Neurology

## 2023-06-22 DIAGNOSIS — Z9189 Other specified personal risk factors, not elsewhere classified: Secondary | ICD-10-CM

## 2023-06-22 LAB — PROTEIN S ACTIVITY: Protein S Activity: 78 % (ref 63–140)

## 2023-06-22 LAB — LUPUS ANTICOAGULANT PANEL
DRVVT: 34.7 s (ref 0.0–47.0)
PTT Lupus Anticoagulant: 31.6 s (ref 0.0–43.5)

## 2023-06-22 LAB — CARDIOLIPIN ANTIBODIES, IGG, IGM, IGA
Anticardiolipin IgA: <9 [APL'U]/mL (ref 0–11)
Anticardiolipin IgG: <9 [GPL'U]/mL (ref 0–14)
Anticardiolipin IgM: 9 [MPL'U]/mL (ref 0–12)

## 2023-06-22 LAB — BETA-2-GLYCOPROTEIN I ABS, IGG/M/A
Beta-2 Glyco I IgG: <9 GPI IgG units (ref 0–20)
Beta-2-Glycoprotein I IgA: <9 GPI IgA units (ref 0–25)
Beta-2-Glycoprotein I IgM: <9 GPI IgM units (ref 0–32)

## 2023-06-22 LAB — PROTEIN S, TOTAL: Protein S Ag, Total: 80 % (ref 60–150)

## 2023-06-22 LAB — PROTEIN C ACTIVITY: Protein C Activity: 89 % (ref 73–180)

## 2023-06-22 NOTE — Addendum Note (Signed)
 Addended by: Ihor Austin L on: 06/22/2023 10:19 AM   Modules accepted: Orders

## 2023-06-22 NOTE — Telephone Encounter (Signed)
 Phone rep was advised that the sleep consult with Dr Frances Furbish needed to be cancelled because Erica [name]  is being referred to a Pulmonologist/sleep specialist. This message was relayed to Erica [name]. This is FYI no call back requested.

## 2023-06-22 NOTE — Telephone Encounter (Signed)
Referral placed as requested. Thank you.

## 2023-06-22 NOTE — Telephone Encounter (Signed)
 Dr. Frances Furbish has reviewed this patient chart and believes this patient is best to follow up with a Pulmonologist/Sleep specialist  and to put a referral in to see them.

## 2023-06-23 LAB — HOMOCYSTEINE: Homocysteine: 14.8 umol/L (ref 0.0–17.2)

## 2023-06-23 LAB — FACTOR 5 LEIDEN

## 2023-06-23 LAB — PROTEIN C, TOTAL: Protein C, Total: 77 % (ref 60–150)

## 2023-06-26 LAB — PROTHROMBIN GENE MUTATION

## 2023-06-27 NOTE — Telephone Encounter (Signed)
 Error

## 2023-06-28 ENCOUNTER — Ambulatory Visit: Payer: Medicare HMO

## 2023-06-28 DIAGNOSIS — L84 Corns and callosities: Secondary | ICD-10-CM

## 2023-06-28 DIAGNOSIS — E1165 Type 2 diabetes mellitus with hyperglycemia: Secondary | ICD-10-CM

## 2023-06-28 DIAGNOSIS — M2141 Flat foot [pes planus] (acquired), right foot: Secondary | ICD-10-CM

## 2023-06-28 NOTE — Progress Notes (Signed)
 Patient presents to the office today for diabetic shoe and insole measuring.  Patient was measured with brannock device to determine size and width for 1 pair of extra depth shoes and foam casted for 3 pair of insoles.   Documentation of medical necessity will be sent to patient's treating diabetic doctor to verify and sign.   Patient's diabetic provider: Bev Brown NP / Lorella Roles MD sent in his name   Shoes and insoles will be ordered at that time and patient will be notified for an appointment for fitting when they arrive.   Shoe size (per patient): 9 Brannock measurement: 9 Patient shoe selection- Shoe choice:   [id number] / Blk brooks [id number] Shoe size ordered:  Financials signed

## 2023-07-04 ENCOUNTER — Institutional Professional Consult (permissible substitution): Payer: Medicare HMO | Admitting: Neurology

## 2023-07-10 ENCOUNTER — Ambulatory Visit: Payer: Medicare HMO

## 2023-07-10 DIAGNOSIS — I639 Cerebral infarction, unspecified: Secondary | ICD-10-CM | POA: Diagnosis not present

## 2023-07-11 LAB — CUP PACEART REMOTE DEVICE CHECK
Date Time Interrogation Session: 20250127110837
Implantable Pulse Generator Implant Date: 20220706
Pulse Gen Model: 436066
Pulse Gen Serial Number: 94069553

## 2023-07-26 ENCOUNTER — Other Ambulatory Visit: Payer: Self-pay | Admitting: Nurse Practitioner

## 2023-07-26 DIAGNOSIS — E2839 Other primary ovarian failure: Secondary | ICD-10-CM

## 2023-07-26 DIAGNOSIS — Z1231 Encounter for screening mammogram for malignant neoplasm of breast: Secondary | ICD-10-CM

## 2023-08-02 ENCOUNTER — Ambulatory Visit: Payer: Medicare HMO | Admitting: Podiatry

## 2023-08-03 ENCOUNTER — Ambulatory Visit: Payer: Medicare HMO | Admitting: Physical Therapy

## 2023-08-03 ENCOUNTER — Ambulatory Visit: Payer: Medicare HMO | Admitting: Speech Pathology

## 2023-08-03 ENCOUNTER — Ambulatory Visit: Payer: Medicare HMO | Admitting: Occupational Therapy

## 2023-08-03 NOTE — Therapy (Deleted)
 OUTPATIENT OCCUPATIONAL THERAPY NEURO EVALUATION  Patient Name: Erica [name] MRN: [id number] DOB:March 23, 1956, 68 y.o., female Today's Date: 08/03/2023  PCP: Estevan Oaks, NP  REFERRING PROVIDER: Estevan Oaks, NP   END OF SESSION:   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   GERD (gastroesophageal reflux disease)    with chronic cough   Headache(784.0)    "mild"   Hypertension    Loop Biotronik loop implant 12/16/2020 12/16/2020   Mental disorder    depression   Neuropathy    feet    Pain    arthritis pain - takes tramadol as needed   Pulmonary fibrosis, unspecified (HCC)    Rectal cancer (HCC)    Rectal polyp    very little bleeding with bowel movements- no pain   Stroke (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   Suicide attempt [facility])    Past Surgical History:  Procedure Laterality Date   ANAL RECTAL MANOMETRY N/A 07/13/2016   Procedure: ANO RECTAL MANOMETRY;  Surgeon: Romie Levee, MD;  Location: [facility] ENDOS[facility];  Service: Endoscopy;  Laterality: N/A;   CESAREAN SECTION     EUS N/A 11/21/2012   Procedure: LOWER ENDOSCOPIC ULTRASOUND (EUS);  Surgeon: Willis Modena, MD;  Location: [facility];  Service: Endoscopy;  Laterality: N/A;   FLEXIBLE SIGMOIDOSCOPY N/A 11/21/2012   Procedure: FLEXIBLE SIGMOIDOSCOPY;  Surgeon: Willis Modena, MD;  Location: [facility];  Service: Endoscopy;  Laterality: N/A;   FLEXIBLE SIGMOIDOSCOPY N/A 01/28/2013   Procedure: FLEXIBLE SIGMOIDOSCOPY;  Surgeon: Romie Levee, MD;  Location: [facility];  Service: Endoscopy;  Laterality: N/A;   LAPAROSCOPIC LOW ANTERIOR RESECTION N/A 01/29/2013   Procedure: LAPAROSCOPIC LOW ANTERIOR RESECTION, Rigid Proctoscopy;  Surgeon: Romie Levee, MD;  Location: [facility];  Service: General;  Laterality: N/A;   LAPAROSCOPIC SIGMOID COLECTOMY N/A 11/14/2012    Procedure: DIAGNOSTIC LAPAROSCOPY AND SIGMOIDMOIDOSCOPY ;  Surgeon: Emelia Loron, MD;  Location: [facility];  Service: General;  Laterality: N/A;   RECTAL ULTRASOUND N/A 07/13/2016   Procedure: RECTAL ULTRASOUND;  Surgeon: Romie Levee, MD;  Location: [facility];  Service: Endoscopy;  Laterality: N/A;   TONSILLECTOMY     TONSILLECTOMY AND ADENOIDECTOMY     Patient Active Problem List   Diagnosis Date Noted   Syncope 03/18/2023   Stroke (cerebrum) (HCC) 03/08/2023   Acute ischemic stroke (HCC) 03/05/2023   Stroke-like symptom 03/04/2023   Research study patient 07/15/2022   Encounter for loop recorder check 01/02/2021   Loop Biotronik loop implant 12/16/2020 12/16/2020   Leukopenia    Controlled type 2 diabetes mellitus with hyperglycemia, with long-term current use of insulin (HCC)    Vascular headache    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   Sinus bradycardia on ECG 11/03/2020   Benign essential HTN    Pancytopenia (HCC)    Stage 3b chronic kidney disease (HCC)    Anemia    Labile blood glucose    Labile blood pressure    Diabetic peripheral neuropathy (HCC)    Essential hypertension    AKI (acute kidney injury) (HCC)    Ataxia, late effect of cerebrovascular disease    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   Acute CVA (cerebrovascular accident) (HCC) 06/26/2020   CVA (cerebral vascular accident) (HCC) 06/25/2020   BMI 32.0-32.9,adult 06/03/2020  Left upper lobe pneumonia 03/30/2020   Chest pain 10/01/2019   MDD (major depressive disorder), recurrent severe, 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   Suicide attempt (HCC) 05/09/2019   Cocaine abuse (HCC) 05/09/2019   Interstitial lung disease (HCC) 06/21/2016   COPD (chronic obstructive pulmonary disease) (HCC) 06/21/2016   Cough 06/21/2016   GERD (gastroesophageal reflux disease) 06/21/2016   Hypersomnia 06/21/2016   Tobacco user 06/21/2016   Rectal cancer (HCC) 01/30/2013     ONSET DATE: 07/04/23 (referral date)  REFERRING DIAG:  Z86.73 (ICD-10-CM) - Personal history of transient cerebral ischemia  I69.354 (ICD-10-CM) - Hemiparesis affecting left side as late effect of cerebrovascular accident (HCC)  M15.9 (ICD-10-CM) - Generalized osteoarthrosis, involving multiple sites    THERAPY DIAG:  No diagnosis found.  Rationale for Evaluation and Treatment: {HABREHAB:27488}  SUBJECTIVE:   SUBJECTIVE STATEMENT: *** Pt accompanied by: {accompnied:27141}  PERTINENT HISTORY: arthritis especially in shoulders, asthma, COPD, depression, DM, hx of multiple strokes, GERD, HTN, loop recorder implant 12/16/20, neuropathy, hx of rectal cancer, pulmonary fibrosis, suicide attempt  Per 06/21/23 NP Progress Notes: history of recurrent ischemic stroke.  She had 2 strokes in 2022 (January and May), again in September 2024 and then October 2024.  She is currently off Plavix and only on aspirin 81 mg PO daily.  She had a LOOP recorder placed in July 2022... She quit smoking 3 years ago after her first stroke... She has remote history of rectal cancer stage I (T1, N0, cM0) followed by Dr. Mosetta Putt... She has mild weakness on the left side including foot drop. Tingling noted in the left side as well post stroke.  Tingling in hands with cold    PRECAUTIONS: {Therapy precautions:24002}   WEIGHT BEARING RESTRICTIONS: {Yes ***/No:24003}  PAIN:  Are you having pain? {OPRCPAIN:27236}  FALLS: Has patient fallen in last 6 months? {fallsyesno:27318}  LIVING ENVIRONMENT: Lives with: {OPRC lives with:25569::"lives with their family"} Lives in: {Lives in:25570} Stairs: {opstairs:27293} Has following equipment at home: {Assistive devices:23999}  PLOF: {PLOF:24004}  PATIENT GOALS: ***  OBJECTIVE:  Note: Objective measures were completed at Evaluation unless otherwise noted.  HAND DOMINANCE: {MISC; OT HAND DOMINANCE:[phone]}  ADLs: Overall ADLs: *** Transfers/ambulation related  to ADLs: Eating: *** Grooming: *** UB Dressing: *** LB Dressing: *** Toileting: *** Bathing: *** Tub Shower transfers: *** Equipment: {equipment:25573}  IADLs: Shopping: *** Light housekeeping: *** Meal Prep: *** Community mobility: *** Medication management: *** Financial management: *** Handwriting: {OTWRITTENEXPRESSION:25361}  MOBILITY STATUS: {OTMOBILITY:25360}  POSTURE COMMENTS:  {posture:25561} Sitting balance: {sitting balance:25483}  ACTIVITY TOLERANCE: Activity tolerance: ***  FUNCTIONAL OUTCOME MEASURES: {OTFUNCTIONALMEASURES:27238}  UPPER EXTREMITY ROM:    {AROM/PROM:27142} ROM Right eval Left eval  Shoulder flexion    Shoulder abduction    Shoulder adduction    Shoulder extension    Shoulder internal rotation    Shoulder external rotation    Elbow flexion    Elbow extension    Wrist flexion    Wrist extension    Wrist ulnar deviation    Wrist radial deviation    Wrist pronation    Wrist supination    (Blank rows = not tested)  UPPER EXTREMITY MMT:     MMT Right eval Left eval  Shoulder flexion    Shoulder abduction    Shoulder adduction    Shoulder extension    Shoulder internal rotation    Shoulder external rotation    Middle trapezius    Lower trapezius    Elbow flexion    Elbow extension  Wrist flexion    Wrist extension    Wrist ulnar deviation    Wrist radial deviation    Wrist pronation    Wrist supination    (Blank rows = not tested)  HAND FUNCTION: {handfunction:27230}  COORDINATION: {otcoordination:27237}  SENSATION: {sensation:27233}  EDEMA: ***  MUSCLE TONE: {UETONE:25567}  COGNITION: Overall cognitive status: {cognition:24006}  VISION: Subjective report: *** Baseline vision: {OTBASELINEVISION:25363} Visual history: {OTVISUALHISTORY:25364}  VISION ASSESSMENT: {visionassessment:27231}  Patient has difficulty with following activities due to following visual impairments: ***  PERCEPTION:  {Perception:25564}  PRAXIS: {Praxis:25565}  OBSERVATIONS: ***                                                                                                                             TREATMENT DATE: ***         PATIENT EDUCATION: Education details: *** Person educated: {Person educated:25204} Education method: {Education Method:25205} Education comprehension: {Education Comprehension:25206}  HOME EXERCISE PROGRAM: ***   GOALS: Goals reviewed with patient? {yes/no:20286}  SHORT TERM GOALS: Target date: ***  *** Baseline: Goal status: INITIAL  2.  *** Baseline:  Goal status: INITIAL  3.  *** Baseline:  Goal status: INITIAL  4.  *** Baseline:  Goal status: INITIAL  5.  *** Baseline:  Goal status: INITIAL  6.  *** Baseline:  Goal status: INITIAL  LONG TERM GOALS: Target date: ***  *** Baseline:  Goal status: INITIAL  2.  *** Baseline:  Goal status: INITIAL  3.  *** Baseline:  Goal status: INITIAL  4.  *** Baseline:  Goal status: INITIAL  5.  *** Baseline:  Goal status: INITIAL  6.  *** Baseline:  Goal status: INITIAL  ASSESSMENT:  CLINICAL IMPRESSION: Patient is a *** y.o. *** who was seen today for occupational therapy evaluation for ***.   PERFORMANCE DEFICITS: in functional skills including {OT physical skills:25468}, cognitive skills including {OT cognitive skills:25469}, and psychosocial skills including {OT psychosocial skills:25470}.   IMPAIRMENTS: are limiting patient from {OT performance deficits:25471}.   CO-MORBIDITIES: {Comorbidities:25485} that affects occupational performance. Patient will benefit from skilled OT to address above impairments and improve overall function.  MODIFICATION OR ASSISTANCE TO COMPLETE EVALUATION: {OT modification:25474}  OT OCCUPATIONAL PROFILE AND HISTORY: {OT PROFILE AND HISTORY:25484}  CLINICAL DECISION MAKING: {OT CDM:25475}  REHAB POTENTIAL:  {rehabpotential:25112}  EVALUATION COMPLEXITY: {Evaluation complexity:25115}    PLAN:  OT FREQUENCY: {rehab frequency:25116}  OT DURATION: {rehab duration:25117}  PLANNED INTERVENTIONS: {OT Interventions:25467}  RECOMMENDED OTHER SERVICES: ***  CONSULTED AND AGREED WITH PLAN OF CARE: {HYQ:65784}  PLAN FOR NEXT SESSION: ***   Wynetta Emery, OT 08/03/2023, 7:52 AM

## 2023-08-05 ENCOUNTER — Inpatient Hospital Stay: Admission: RE | Admit: 2023-08-05 | Payer: Medicare HMO | Source: Ambulatory Visit

## 2023-08-07 ENCOUNTER — Telehealth: Payer: Self-pay

## 2023-08-07 NOTE — Telephone Encounter (Signed)
 Number I dialed was a little odd. A response was asking who and why I was calling then they said they werent able to be reached. Will try again

## 2023-08-09 ENCOUNTER — Telehealth: Payer: Self-pay

## 2023-08-09 NOTE — Telephone Encounter (Signed)
 Their voicemail is a little different, everytime I call it is like a generated response.

## 2023-08-11 ENCOUNTER — Encounter: Payer: Self-pay | Admitting: *Deleted

## 2023-08-11 NOTE — Progress Notes (Signed)
 Pt attended 02/01/23 screening event where her b/p was 138/82 . At the event, the pt shared her PCP was Dr. Milford Cage but she moved away from [place] and ended up staying with her sister in [place]. During initial event f/u pt asked for PCP resources to be sent to her at her sister's address. However, during 60 day event f/u call, pt shared she had had 2 strokes since the event and was now able to move around independently so was trying to rent an apt in [place]. She also noted her insurance helped her with transportation to her healthcare appts and sent her food supplies "once a month that last me pretty well for the month." During final 6 month f/u, chart review reveals pt was seen in the ED on 06/09/23 note confirms pt's PCP as Estevan Oaks, NP at [facility] (note references recent diabetes med change by same PCP.) Chart review also reveals that pt [facility] to be [facility], Dr. Archer Asa who has made referrals for pt for her DM f/u as recently as 06/01/23. [facility]'s address updated [facility] recent [facility] records. No additional health equity team support scheduled at this time.

## 2023-08-14 ENCOUNTER — Ambulatory Visit: Payer: Medicare HMO

## 2023-08-14 DIAGNOSIS — I639 Cerebral infarction, unspecified: Secondary | ICD-10-CM

## 2023-08-16 ENCOUNTER — Ambulatory Visit: Payer: Medicare HMO | Attending: Nurse Practitioner

## 2023-08-16 ENCOUNTER — Ambulatory Visit: Payer: Medicare HMO | Admitting: Physical Therapy

## 2023-08-16 ENCOUNTER — Ambulatory Visit: Payer: Medicare HMO | Admitting: Occupational Therapy

## 2023-08-16 LAB — CUP PACEART REMOTE DEVICE CHECK
Date Time Interrogation Session: 20250303092509
Implantable Pulse Generator Implant Date: 20220706
Pulse Gen Model: 436066
Pulse Gen Serial Number: 94069553

## 2023-08-21 NOTE — Progress Notes (Signed)
 Biotronik loop Recorder

## 2023-08-21 NOTE — Addendum Note (Signed)
 Addended by: Geralyn Flash D on: 08/21/2023 09:22 AM   Modules accepted: Orders

## 2023-08-22 ENCOUNTER — Encounter: Admitting: Occupational Therapy

## 2023-08-22 ENCOUNTER — Other Ambulatory Visit (HOSPITAL_COMMUNITY): Payer: Self-pay

## 2023-08-22 ENCOUNTER — Ambulatory Visit: Admitting: Physical Therapy

## 2023-08-22 ENCOUNTER — Encounter

## 2023-08-23 ENCOUNTER — Other Ambulatory Visit (HOSPITAL_COMMUNITY): Payer: Self-pay

## 2023-08-25 ENCOUNTER — Ambulatory Visit (INDEPENDENT_AMBULATORY_CARE_PROVIDER_SITE_OTHER): Payer: Medicare HMO | Admitting: Podiatry

## 2023-08-25 DIAGNOSIS — Z794 Long term (current) use of insulin: Secondary | ICD-10-CM

## 2023-08-25 DIAGNOSIS — L89891 Pressure ulcer of other site, stage 1: Secondary | ICD-10-CM | POA: Diagnosis not present

## 2023-08-25 DIAGNOSIS — E1165 Type 2 diabetes mellitus with hyperglycemia: Secondary | ICD-10-CM

## 2023-08-25 NOTE — Progress Notes (Signed)
 Subjective:  Patient ID: Erica [name], female    DOB: [birth year],  MRN: [id number]  Chief Complaint  Patient presents with   Nail Problem    Pain in the nail    68 y.o. female presents with the above complaint.  Patient presents with left distal hallux stage I pressure injury.  Patient states there is pain to the distal toe.  Has been on for quite some time is progressive gotten worse.  She states it came out of nowhere.  She went to get it evaluated she is a diabetic.  She is picking up her diabetic shoes today as well   Review of Systems: Negative except as noted in the HPI. Denies N/V/F/Ch.  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   GERD (gastroesophageal reflux disease)    with chronic cough   Headache(784.0)    "mild"   Hypertension    Loop Biotronik loop implant 12/16/2020 12/16/2020   Mental disorder    depression   Neuropathy    feet    Pain    arthritis pain - takes tramadol as needed   Pulmonary fibrosis, unspecified (HCC)    Rectal cancer (HCC)    Rectal polyp    very little bleeding with bowel movements- no pain   Stroke (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   Suicide attempt (HCC)     Current Outpatient Medications:    acetaminophen (TYLENOL) 325 MG tablet, Take 2 tablets (650 mg total) by mouth 2 (two) times daily., Disp: , Rfl:    albuterol (VENTOLIN HFA) 108 (90 Base) MCG/ACT inhaler, Inhale 2 puffs into the lungs every 4 (four) hours as needed for wheezing., Disp: 1 g, Rfl: 5   amLODipine (NORVASC) 10 MG tablet, Take 1 tablet (10 mg total) by mouth daily., Disp: 30 tablet, Rfl: 0   cetirizine (ZYRTEC) 10 MG tablet, Take 1 tablet (10 mg total) by mouth daily., Disp: 30 tablet, Rfl: 0   diclofenac Sodium (VOLTAREN) 1 % GEL, SMARTSIG:4 Gram(s) Topical 4 Times Daily PRN, Disp: , Rfl:    FARXIGA 10 MG TABS  tablet, Take 10 mg by mouth daily., Disp: , Rfl:    gabapentin (NEURONTIN) 100 MG capsule, Take 1 capsule (100 mg total) by mouth 2 (two) times daily. Patient is taking as needed, Disp: 60 capsule, Rfl: 0   HYDROcodone-acetaminophen (NORCO/VICODIN) 5-325 MG tablet, Take 1 tablet by mouth every 6 (six) hours as needed., Disp: , Rfl:    loratadine (CLARITIN) 10 MG tablet, Take 1 tablet (10 mg total) by mouth daily as needed for allergies., Disp: 30 tablet, Rfl: 0   naphazoline-glycerin (CLEAR EYES REDNESS) 0.012-0.25 % SOLN, Place 1-2 drops into both eyes 3 (three) times daily with meals., Disp: , Rfl:    pantoprazole (PROTONIX) 40 MG tablet, Take 40 mg by mouth daily., Disp: , Rfl:    Pirfenidone 801 MG TABS, TAKE 1 TABLET (801 MG) BY MOUTH WITH BREAKFAST, WITH LUNCH, AND WITH EVENING MEAL. (Patient taking differently: Take 1 tablet by mouth daily. Patient is taking 1 tablet daily current with breakfast), Disp: 270 tablet, Rfl: 0   rosuvastatin (CRESTOR) 40 MG tablet, Take 1 tablet (40 mg total) by mouth daily., Disp: 30 tablet, Rfl: 0   sacubitril-valsartan (ENTRESTO) 49-51 MG, Take 1 tablet by mouth 2 (two) times  daily., Disp: 60 tablet, Rfl: 0   sertraline (ZOLOFT) 50 MG tablet, Take 1 tablet (50 mg total) by mouth daily., Disp: 30 tablet, Rfl: 0   SYMBICORT 160-4.5 MCG/ACT inhaler, INHALE 2 PUFFS INTO THE LUNGS TWICE DAILY, Disp: 10.2 g, Rfl: 5   traZODone (DESYREL) 100 MG tablet, Take 1 tablet (100 mg total) by mouth at bedtime., Disp: 30 tablet, Rfl: 0   triamcinolone cream (KENALOG) 0.1 %, Apply 1 Application topically 2 (two) times daily., Disp: , Rfl:    venlafaxine XR (EFFEXOR-XR) 150 MG 24 hr capsule, Take 1 capsule (150 mg total) by mouth daily., Disp: 30 capsule, Rfl: 0   Vitamin D, Ergocalciferol, (DRISDOL) 1.25 MG (50000 UNIT) CAPS capsule, Take 50,000 Units by mouth once a week., Disp: , Rfl:   Social History   Tobacco Use  Smoking Status Former   Current packs/day: 0.00   Average  packs/day: 0.5 packs/day for 20.0 years (10.0 ttl pk-yrs)   Types: Cigarettes   Start date: 01/05/1998   Quit date: 01/05/2018   Years since quitting: 5.6  Smokeless Tobacco Never  Tobacco Comments   Unknown     Allergies  Allergen Reactions   Penicillins Hives, Itching and Swelling    Tongue swelling   Objective:  There were no vitals filed for this visit. There is no height or weight on file to calculate BMI. Constitutional Well developed. Well nourished.  Vascular Dorsalis pedis pulses palpable bilaterally. Posterior tibial pulses palpable bilaterally. Capillary refill normal to all digits.  No cyanosis or clubbing noted. Pedal hair growth normal.  Neurologic Normal speech. Oriented to person, place, and time. Epicritic sensation to light touch grossly present bilaterally.  Dermatologic No further left hallux distal tip pressure injury.  No breakdown of skin noted.  No infection noted no wound noted  Orthopedic: Normal joint ROM without pain or crepitus bilaterally. No visible deformities. No bony tenderness.   Radiographs: None Assessment:   No diagnosis found.  Plan:  Patient was evaluated and treated and all questions answered.  Left hallux distal pressure injury -Clinically healed and officially discharged from my care I discussed shoe shoe gear modification extensive detail if any foot and ankle issues arise in the future she will come back and see me.  If it reoccurs she will come back and see me. No follow-ups on file.

## 2023-08-28 ENCOUNTER — Other Ambulatory Visit (HOSPITAL_COMMUNITY): Payer: Self-pay

## 2023-08-29 NOTE — Therapy (Addendum)
 OUTPATIENT OCCUPATIONAL THERAPY NEURO EVALUATION  Patient Name: Erica [name] MRN: [id number] DOB:[birth year], 68 y.o., female Today's Date: 08/29/2023  PCP: Estevan Oaks, NP  REFERRING PROVIDER: Estevan Oaks, NP   END OF SESSION:   08/30/23 1517  OT Visits / Re-Eval  Visit Number 1  Number of Visits 16  Date for OT Re-Evaluation 10/30/23  Authorization  Authorization Type [employer] MCR primary, MCD secondary  Progress Note Due on Visit 10  OT Time Calculation  OT Start Time 1100  OT Stop Time 1145  OT Time Calculation (min) 45 min  End of Session  Activity Tolerance Patient tolerated treatment well  Behavior During Therapy WFL for tasks assessed/performed    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   GERD (gastroesophageal reflux disease)    with chronic cough   Headache(784.0)    "mild"   Hypertension    Loop Biotronik loop implant 12/16/2020 12/16/2020   Mental disorder    depression   Neuropathy    feet    Pain    arthritis pain - takes tramadol as needed   Pulmonary fibrosis, unspecified (HCC)    Rectal cancer (HCC)    Rectal polyp    very little bleeding with bowel movements- no pain   Stroke (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   Suicide attempt [facility])    Past Surgical History:  Procedure Laterality Date   ANAL RECTAL MANOMETRY N/A 07/13/2016   Procedure: ANO RECTAL MANOMETRY;  Surgeon: Romie Levee, MD;  Location: [facility];  Service: Endoscopy;  Laterality: N/A;   CESAREAN SECTION     EUS N/A 11/21/2012   Procedure: LOWER ENDOSCOPIC ULTRASOUND (EUS);  Surgeon: Willis Modena, MD;  Location: [facility];  Service: Endoscopy;  Laterality: N/A;   FLEXIBLE SIGMOIDOSCOPY N/A 11/21/2012   Procedure: FLEXIBLE SIGMOIDOSCOPY;  Surgeon: Willis Modena, MD;  Location: [facility];  Service: Endoscopy;   Laterality: N/A;   FLEXIBLE SIGMOIDOSCOPY N/A 01/28/2013   Procedure: FLEXIBLE SIGMOIDOSCOPY;  Surgeon: Romie Levee, MD;  Location: [facility];  Service: Endoscopy;  Laterality: N/A;   LAPAROSCOPIC LOW ANTERIOR RESECTION N/A 01/29/2013   Procedure: LAPAROSCOPIC LOW ANTERIOR RESECTION, Rigid Proctoscopy;  Surgeon: Romie Levee, MD;  Location: [facility];  Service: General;  Laterality: N/A;   LAPAROSCOPIC SIGMOID COLECTOMY N/A 11/14/2012   Procedure: DIAGNOSTIC LAPAROSCOPY AND SIGMOIDMOIDOSCOPY ;  Surgeon: Emelia Loron, MD;  Location: [facility];  Service: General;  Laterality: N/A;   RECTAL ULTRASOUND N/A 07/13/2016   Procedure: RECTAL ULTRASOUND;  Surgeon: Romie Levee, MD;  Location: [facility];  Service: Endoscopy;  Laterality: N/A;   TONSILLECTOMY     TONSILLECTOMY AND ADENOIDECTOMY     Patient Active Problem List   Diagnosis Date Noted   Syncope 03/18/2023   Stroke (cerebrum) (HCC) 03/08/2023   Acute ischemic stroke (HCC) 03/05/2023   Stroke-like symptom 03/04/2023   Research study patient 07/15/2022   Encounter for loop recorder check 01/02/2021   Loop Biotronik loop implant 12/16/2020 12/16/2020   Leukopenia    Controlled type 2 diabetes mellitus with hyperglycemia, with long-term current use of insulin (HCC)    Vascular headache    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   Sinus bradycardia on ECG 11/03/2020   Benign essential HTN    Pancytopenia (HCC)  Stage 3b chronic kidney disease (HCC)    Anemia    Labile blood glucose    Labile blood pressure    Diabetic peripheral neuropathy (HCC)    Essential hypertension    AKI (acute kidney injury) (HCC)    Ataxia, late effect of cerebrovascular disease    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   Acute CVA (cerebrovascular accident) (HCC) 06/26/2020   CVA (cerebral vascular accident) (HCC) 06/25/2020   BMI 32.0-32.9,adult 06/03/2020   Left upper lobe pneumonia 03/30/2020   Chest pain  10/01/2019   MDD (major depressive disorder), recurrent severe, 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   Suicide attempt (HCC) 05/09/2019   Cocaine abuse (HCC) 05/09/2019   Interstitial lung disease (HCC) 06/21/2016   COPD (chronic obstructive pulmonary disease) (HCC) 06/21/2016   Cough 06/21/2016   GERD (gastroesophageal reflux disease) 06/21/2016   Hypersomnia 06/21/2016   Tobacco user 06/21/2016   Rectal cancer (HCC) 01/30/2013    ONSET DATE: 07/04/23 (referral date)  REFERRING DIAG:  Z86.73 (ICD-10-CM) - Personal history of transient cerebral ischemia  I69.354 (ICD-10-CM) - Hemiparesis affecting left side as late effect of cerebrovascular accident (HCC)  M15.9 (ICD-10-CM) - Generalized osteoarthrosis, involving multiple sites    THERAPY DIAG:    Rationale for Evaluation and Treatment: Rehabilitation  SUBJECTIVE:   SUBJECTIVE STATEMENT: I want to work on my speech Pt accompanied by: self  PERTINENT HISTORY: arthritis especially in shoulders, asthma, COPD, depression, DM, hx of multiple strokes, GERD, HTN, loop recorder implant 12/16/20, neuropathy, hx of rectal cancer, pulmonary fibrosis, suicide attempt  Per 06/21/23 NP Progress Notes: history of recurrent ischemic stroke.  She had 2 strokes in 2022 (January and May), again in September 2024 and then October 2024.  She is currently off Plavix and only on aspirin 81 mg PO daily.  She had a LOOP recorder placed in July 2022... She quit smoking 3 years ago after her first stroke... She has remote history of rectal cancer stage I (T1, N0, cM0) followed by Dr. Mosetta Putt... She has mild weakness on the left side including foot drop. Tingling noted in the left side as well post stroke.  Tingling in hands with cold    PRECAUTIONS: Fall and Other: loop implant, no driving    WEIGHT BEARING RESTRICTIONS: No  PAIN:  Are you having pain?  No but wears glove Lt hand d/t feeling "cold"   FALLS: Has patient fallen in last 6 months? No  LIVING  ENVIRONMENT: Lives with: lives alone (24 hr care from family) but looking into an Assisted Living Facility Lives in: apartment on first  floor, washer/dryer in apt Has following equipment at home: Single point cane, Environmental consultant - 2 wheeled, Wheelchair (manual), shower chair, and bed side commode  PLOF: Independent with basic ADLs prior to December  PATIENT GOALS: speech primary goal, balance and Lt hand/UE secondary  OBJECTIVE:  Note: Objective measures were completed at Evaluation unless otherwise noted.  HAND DOMINANCE: Right  ADLs: Overall ADLs: overall mod I to supervision for BADLs Transfers/ambulation related to ADLs: walks w/ cane in house Eating: difficulty cutting food Grooming: mod I  UB Dressing: mod I  LB Dressing: mod I  Toileting: mod I  Bathing: usually has supervision and occasional assist to wash back, but not always - therapist recommended ALWAYS having someone there when she is showering for safety reasons Tub Shower transfers: close supervision Equipment: Shower seat with back and Grab bars  IADLs: Shopping: pt uses scooter, sister drives pt to store Light housekeeping: does  light cleaning (swifer, washes dishes, laundry) - pt's sister does vacuuming/heavier cleaning Meal Prep: pt orders out a lot, does microwave, cold prep meal Community mobility: relies on sister or [employer], public transportation Medication management: pt's sister places meds in pillbox each week Financial management: son takes care of bills Handwriting: 100% legible and print (some expressive aphasia)  MOBILITY STATUS:  uses cane in apt, walker more when out   UPPER EXTREMITY ROM:  RUE WNL's, LUE WFL's but does have to focus/attend to Lt side d/t decreased sensation and attention to Lt side.   UPPER EXTREMITY MMT:   not tested d/t previous Lt shoulder issues   HAND FUNCTION: Grip strength: Right: 38.1 lbs; Left: 35.4 lbs  COORDINATION: 9 Hole Peg test: Right: 26.64 sec; Left: 40.29  sec  SENSATION: Light touch: WFL 2 pt discrimination impaired  Lt hand w/ Rt hand simultaneous stimulation Lt hand feels cold, numbness/tingling Reports dropping things from Lt hand  EDEMA: none in hands   COGNITION: Overall cognitive status:  word finding difficulties (mild expressive and ? Some receptive aphasia vs. Slower processing info), pt also reports memory deficits and reports being diagnosed with dementia  VISION: Subjective report: I wear glasses but I lost them. My Lt eye doesn't work as well Baseline vision: Wears glasses all the time (but lost them) Visual history:  none reported  VISION ASSESSMENT: To be further assessed in functional context ? Lt peripheral loss   PERCEPTION: Not tested  PRAXIS: Not tested  OBSERVATIONS: reports dropping things from Lt hand, wears copper glove Lt hand d/t feeling "cold"                                                                                                                              TREATMENT DATE: 08/30/23    O.T. findings, POC, goals - pt agreeable to O.T.   Also recommended pt always have someone in apartment when she goes to shower (for safety reasons)   PATIENT EDUCATION: Education details: see above Person educated: Patient Education method: Explanation Education comprehension: verbalized understanding  HOME EXERCISE PROGRAM: N/A   GOALS: Goals reviewed with patient? Yes  SHORT TERM GOALS: Target date: 09/30/23  Independent with initial HEP for LUE (coordination, ROM) Baseline: Goal status: INITIAL  2.  Pt to verbalize safety considerations for decreased sensation Lt hand Baseline:  Goal status: INITIAL  3.  Pt to attend to Lt side with tabletop and near body scanning and report less drops LUE Baseline:  Goal status: INITIAL  4.  Pt to perform tabletop scanning with no cues required to look far Lt Baseline:  Goal status: INITIAL   LONG TERM GOALS: Target date: 10/30/23  Independent  with updated HEP  Baseline:  Goal status: INITIAL  2.  Pt to improve LUE coordination as evidenced by performing 9 hole peg test in 35 sec or less Baseline: 40 sec Goal status: INITIAL  3.  Pt to use Lt hand  consistently as assist for bilateral tasks  Baseline:  Goal status: INITIAL  4.  Pt to demo dynamic standing task for 10 min w/o LOB for IADLS  Baseline:  Goal status: INITIAL  5.  Environmental scanning with 85% or greater accuracy Baseline:  Goal status: INITIAL   ASSESSMENT:  CLINICAL IMPRESSION: Patient is a 68 y.o. female who was seen today for occupational therapy evaluation for h/o strokes (2022 and 2024) with residual Lt hemiparesis, decreased sensation, coordination, attention to Lt side and potential field cut to Lt side. Pt also with decreased balance and word finding difficulty. (See above for extensive history). Patient currently presents below baseline level of functioning demonstrating functional deficits and impairments as noted below. Pt would benefit from skilled OT services in the outpatient setting to work on impairments as noted below to help pt return to PLOF as able.   Marland Kitchen   PERFORMANCE DEFICITS: in functional skills including ADLs, IADLs, coordination, dexterity, proprioception, sensation, strength, Fine motor control, Gross motor control, mobility, balance, body mechanics, decreased knowledge of precautions, decreased knowledge of use of DME, vision, and UE functional use, cognitive skills including attention, memory, and safety awareness, and psychosocial skills including coping strategies.   IMPAIRMENTS: are limiting patient from ADLs, IADLs, leisure, and social participation.   CO-MORBIDITIES: may have co-morbidities  that affects occupational performance. Patient will benefit from skilled OT to address above impairments and improve overall function.  MODIFICATION OR ASSISTANCE TO COMPLETE EVALUATION: No modification of tasks or assist necessary to  complete an evaluation.  OT OCCUPATIONAL PROFILE AND HISTORY: Detailed assessment: Review of records and additional review of physical, cognitive, psychosocial history related to current functional performance.  CLINICAL DECISION MAKING: Moderate - several treatment options, min-mod task modification necessary  REHAB POTENTIAL: Good  EVALUATION COMPLEXITY: Moderate    PLAN:  OT FREQUENCY: 2x/week  OT DURATION: 8 weeks (may reduce down to 1x/wk if pt unable to get here consistently at 2x/wk)  PLANNED INTERVENTIONS: 97535 self care/ADL training, 16109 therapeutic exercise, 97530 therapeutic activity, 97112 neuromuscular re-education, 97140 manual therapy, 97035 ultrasound, 97039 fluidotherapy, 97010 moist heat, functional mobility training, visual/perceptual remediation/compensation, patient/family education, and DME and/or AE instructions  RECOMMENDED OTHER SERVICES: none at this time  CONSULTED AND AGREED WITH PLAN OF CARE: Patient  PLAN FOR NEXT SESSION: coordination HEP, sensory education, attention to Lt side   Sheran Lawless, OT 08/29/2023, 2:52 PM

## 2023-08-30 ENCOUNTER — Ambulatory Visit: Admitting: Occupational Therapy

## 2023-08-30 ENCOUNTER — Encounter: Payer: Self-pay | Admitting: Sleep Medicine

## 2023-08-30 ENCOUNTER — Encounter: Payer: Self-pay | Admitting: Occupational Therapy

## 2023-08-30 ENCOUNTER — Ambulatory Visit: Attending: Nurse Practitioner | Admitting: Physical Therapy

## 2023-08-30 ENCOUNTER — Encounter: Payer: Self-pay | Admitting: Internal Medicine

## 2023-08-30 ENCOUNTER — Ambulatory Visit

## 2023-08-30 VITALS — BP 164/91 | HR 61

## 2023-08-30 DIAGNOSIS — R29818 Other symptoms and signs involving the nervous system: Secondary | ICD-10-CM | POA: Insufficient documentation

## 2023-08-30 DIAGNOSIS — R41842 Visuospatial deficit: Secondary | ICD-10-CM | POA: Diagnosis present

## 2023-08-30 DIAGNOSIS — R471 Dysarthria and anarthria: Secondary | ICD-10-CM | POA: Insufficient documentation

## 2023-08-30 DIAGNOSIS — R41841 Cognitive communication deficit: Secondary | ICD-10-CM

## 2023-08-30 DIAGNOSIS — M6281 Muscle weakness (generalized): Secondary | ICD-10-CM

## 2023-08-30 DIAGNOSIS — R2681 Unsteadiness on feet: Secondary | ICD-10-CM | POA: Insufficient documentation

## 2023-08-30 DIAGNOSIS — R41840 Attention and concentration deficit: Secondary | ICD-10-CM | POA: Diagnosis present

## 2023-08-30 DIAGNOSIS — R278 Other lack of coordination: Secondary | ICD-10-CM

## 2023-08-30 DIAGNOSIS — R2689 Other abnormalities of gait and mobility: Secondary | ICD-10-CM | POA: Diagnosis present

## 2023-08-30 DIAGNOSIS — R208 Other disturbances of skin sensation: Secondary | ICD-10-CM | POA: Insufficient documentation

## 2023-08-30 NOTE — Therapy (Signed)
 OUTPATIENT PHYSICAL THERAPY NEURO EVALUATION   Patient Name: Erica [name] MRN: [id number] DOB:1955-09-22, 68 y.o., female Today's Date: 08/30/2023   PCP: Estevan Oaks, NP REFERRING PROVIDER: Estevan Oaks, NP  END OF SESSION:  PT End of Session - 08/30/23 1148     Visit Number 1    Number of Visits 13   with eval   Date for PT Re-Evaluation 10/25/23   to allow for scheduling delays   Authorization Type [employer]    PT Start Time 1148   from OT eval   PT Stop Time 1229    PT Time Calculation (min) 41 min    Equipment Utilized During Treatment Gait belt    Activity Tolerance Patient tolerated treatment well    Behavior During Therapy WFL for tasks assessed/performed             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   GERD (gastroesophageal reflux disease)    with chronic cough   Headache(784.0)    "mild"   Hypertension    Loop Biotronik loop implant 12/16/2020 12/16/2020   Mental disorder    depression   Neuropathy    feet    Pain    arthritis pain - takes tramadol as needed   Pulmonary fibrosis, unspecified (HCC)    Rectal cancer (HCC)    Rectal polyp    very little bleeding with bowel movements- no pain   Stroke (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   Suicide attempt [facility])    Past Surgical History:  Procedure Laterality Date   ANAL RECTAL MANOMETRY N/A 07/13/2016   Procedure: ANO RECTAL MANOMETRY;  Surgeon: Romie Levee, MD;  Location: [facility] ENDOSCOPY;  Service: Endoscopy;  Laterality: N/A;   CESAREAN SECTION     EUS N/A 11/21/2012   Procedure: LOWER ENDOSCOPIC ULTRASOUND (EUS);  Surgeon: Willis Modena, MD;  Location: [facility];  Service: Endoscopy;  Laterality: N/A;   FLEXIBLE SIGMOIDOSCOPY N/A 11/21/2012   Procedure: FLEXIBLE SIGMOIDOSCOPY;  Surgeon: Willis Modena, MD;  Location: [facility]  ENDOSCOPY;  Service: Endoscopy;  Laterality: N/A;   FLEXIBLE SIGMOIDOSCOPY N/A 01/28/2013   Procedure: FLEXIBLE SIGMOIDOSCOPY;  Surgeon: Romie Levee, MD;  Location: [facility];  Service: Endoscopy;  Laterality: N/A;   LAPAROSCOPIC LOW ANTERIOR RESECTION N/A 01/29/2013   Procedure: LAPAROSCOPIC LOW ANTERIOR RESECTION, Rigid Proctoscopy;  Surgeon: Romie Levee, MD;  Location: [facility];  Service: General;  Laterality: N/A;   LAPAROSCOPIC SIGMOID COLECTOMY N/A 11/14/2012   Procedure: DIAGNOSTIC LAPAROSCOPY AND SIGMOIDMOIDOSCOPY ;  Surgeon: Emelia Loron, MD;  Location: [facility];  Service: General;  Laterality: N/A;   RECTAL ULTRASOUND N/A 07/13/2016   Procedure: RECTAL ULTRASOUND;  Surgeon: Romie Levee, MD;  Location: [facility];  Service: Endoscopy;  Laterality: N/A;   TONSILLECTOMY     TONSILLECTOMY AND ADENOIDECTOMY     Patient Active Problem List   Diagnosis Date Noted   Syncope 03/18/2023   Stroke (cerebrum) (HCC) 03/08/2023   Acute ischemic stroke (HCC) 03/05/2023   Stroke-like symptom 03/04/2023   Research study patient 07/15/2022   Encounter for loop recorder check 01/02/2021   Loop Biotronik loop implant 12/16/2020 12/16/2020   Leukopenia    Controlled type 2 diabetes mellitus with hyperglycemia, with long-term current use of insulin (HCC)    Vascular headache    Parietal  lobe infarction (HCC) 11/06/2020   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   Sinus bradycardia on ECG 11/03/2020   Benign essential HTN    Pancytopenia (HCC)    Stage 3b chronic kidney disease (HCC)    Anemia    Labile blood glucose    Labile blood pressure    Diabetic peripheral neuropathy (HCC)    Essential hypertension    AKI (acute kidney injury) (HCC)    Ataxia, late effect of cerebrovascular disease    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   Acute CVA (cerebrovascular accident) (HCC) 06/26/2020   CVA (cerebral vascular accident) (HCC) 06/25/2020   BMI 32.0-32.9,adult 06/03/2020   Left upper lobe  pneumonia 03/30/2020   Chest pain 10/01/2019   MDD (major depressive disorder), recurrent severe, 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   Suicide attempt (HCC) 05/09/2019   Cocaine abuse (HCC) 05/09/2019   Interstitial lung disease (HCC) 06/21/2016   COPD (chronic obstructive pulmonary disease) (HCC) 06/21/2016   Cough 06/21/2016   GERD (gastroesophageal reflux disease) 06/21/2016   Hypersomnia 06/21/2016   Tobacco user 06/21/2016   Rectal cancer (HCC) 01/30/2013    ONSET DATE: 07/04/2023 (referral date)  REFERRING DIAG: Z86.73 (ICD-10-CM) - Personal history of transient ischemic attack I69.354 (ICD-10-CM) - Hemiparesis affecting left side as late effect of cerebrovascular accident (HCC) M15.9 (ICD-10-CM) - Generalized osteoarthrosis, involving multiple sites  THERAPY DIAG:  Other abnormalities of gait and mobility  Muscle weakness (generalized)  Unsteadiness on feet  Rationale for Evaluation and Treatment: Rehabilitation  SUBJECTIVE:                                                                                                                                                                                             SUBJECTIVE STATEMENT: Pt arrives alone to PT session with her [facility], reports she typically just uses her [facility] in her apartment and her RW for community mobility. Pt reports that walking in general is difficult and that she has drop foot on L side, wearing a foot-up brace. Pt also states, "My speech is bad" and states that it is hard to get words out at times.  Pt reports that she lives alone but that her sister and her son come in to assist, son helps with cooking, cleaning, etc.  Pt reports that she sees her PCP about 1 time per month to continue to work on BP management.  Pt accompanied by: self, took [facility] [place] to her appointment today  PERTINENT HISTORY: arthritis, asthma, depression, anxiety, GERD, HTN, neuropathy, suicide attempt, DM, CVA with residual L side  deficits, rectal cancer, COPD/emphysema, pulmonary fibrosis with baseline dyspnea and chronic cough  Per Ihor Austin note 05/16/23: Erica [name] is a 67 y.o. year old female with recent acute to subacute bilateral centrum semiovale infarcts 02/12/2023 and left thalamic infarct on 03/18/2023 likely secondary to small vessel disease.  History of recurrent right MCA strokes on 11/03/2020 and 06/25/2020 likely in setting of small vessel disease although unable to rule out cardioembolic source with residual left hemiparesis and sensory impairment.  Loop recorder placed 12/2020 by Dr. Odis Hollingshead [facility] cardiology).  Vascular risk factors include HTN, HLD, DM, CAD, history of tobacco use and history of polysubstance abuse and multiple prior strokes noted on imaging.   PAIN:  Are you having pain? No  PRECAUTIONS: Fall and Other: loop recorder  RED FLAGS: None   WEIGHT BEARING RESTRICTIONS: No  FALLS: Has patient fallen in last 6 months? No  LIVING ENVIRONMENT: Lives with: lives alone, sister and son come check on her Lives in: House/apartment Stairs: No Has following equipment at home: Single point cane, Environmental consultant - 2 wheeled, and Wheelchair (manual)  PLOF: Independent with gait, Independent with transfers, and Requires assistive device for independence  PATIENT GOALS: "to learn to walk faster so I can start walking around the corner"  OBJECTIVE:  Note: Objective measures were completed at Evaluation unless otherwise noted.  DIAGNOSTIC FINDINGS:  Brain MRI 03/18/2023 IMPRESSION: 1. Acute punctate nonhemorrhagic infarct of the left thalamus. 2. Previously noted subcortical infarcts of the centrum semi ovale demonstrate expected change in are only faintly seen on the fusion weighted images without definite restricted diffusion. 3. 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 4. Remote watershed territory infarcts and diffuse subcortical white matter disease is also  stable. 5. Remote lacunar infarcts of the basal ganglia and cerebellum bilaterally are stable.  COGNITION: Overall cognitive status: Within functional limits for tasks assessed   SENSATION: N/T in L hand  Light touch decreased on LLE as compared to RLE  COORDINATION: decreased in LLE due to strength impairments  EDEMA:  Occasional edema if she doesn't wear her support hose  POSTURE: rounded shoulders, forward head, and posterior pelvic tilt  LOWER EXTREMITY ROM:     Active  Right Eval Left Eval  Hip flexion    Hip extension    Hip abduction    Hip adduction    Hip internal rotation    Hip external rotation    Knee flexion    Knee extension    Ankle dorsiflexion  decreased  Ankle plantarflexion    Ankle inversion    Ankle eversion     (Blank rows = not tested)  LOWER EXTREMITY MMT:    MMT Right Eval Left Eval  Hip flexion 5 4  Hip extension    Hip abduction    Hip adduction    Hip internal rotation    Hip external rotation    Knee flexion 5 4  Knee extension 5 4  Ankle dorsiflexion 4 2-  Ankle plantarflexion    Ankle inversion    Ankle eversion    (Blank rows = not tested)  BED MOBILITY:  Mod I per patient report; uses step stool as her bed is high  TRANSFERS: Assistive device utilized: Single point cane  Sit to stand: Modified independence Stand to sit: Modified independence Chair to chair: Modified independence Floor:  not assessed at eval  GAIT: Gait pattern: decreased step length- Right, decreased step length- Left, decreased stride length, decreased hip/knee flexion- Right, decreased hip/knee flexion- Left, decreased ankle dorsiflexion- Right, decreased ankle dorsiflexion- Left,  shuffling, trunk flexed, and narrow BOS Distance walked: various clinic distances Assistive device utilized: Single point cane Level of assistance: Modified independence  FUNCTIONAL TESTS:    [facility] PT Assessment - 08/30/23 1200       Ambulation/Gait   Gait  velocity 32.8 ft over 17.6 sec = 1.86 ft/sec   with [facility]     Standardized Balance Assessment   Standardized Balance Assessment Timed Up and Go Test;Five Times Sit to Stand;Berg Balance Test    Five times sit to stand comments  24.22 sec   no UE     Berg Balance Test   Sit to Stand Able to stand without using hands and stabilize independently    Standing Unsupported Able to stand safely 2 minutes    Sitting with Back Unsupported but Feet Supported on Floor or Stool Able to sit safely and securely 2 minutes    Stand to Sit Sits safely with minimal use of hands    Transfers Able to transfer safely, definite need of hands    Standing Unsupported with Eyes Closed Able to stand 10 seconds with supervision    Standing Unsupported with Feet Together Able to place feet together independently and stand for 1 minute with supervision    From Standing, Reach Forward with Outstretched Arm Can reach forward >5 cm safely (2")    From Standing Position, Pick up Object from Floor Able to pick up shoe, needs supervision    From Standing Position, Turn to Look Behind Over each Shoulder Turn sideways only but maintains balance    Turn 360 Degrees Needs close supervision or verbal cueing    Standing Unsupported, Alternately Place Feet on Step/Stool Needs assistance to keep from falling or unable to try    Standing Unsupported, One Foot in Front Able to take small step independently and hold 30 seconds    Standing on One Leg Unable to try or needs assist to prevent fall    Total Score 35    Berg comment: 35/56, high fall risk      Timed Up and Go Test   TUG Normal TUG    Normal TUG (seconds) 27   with SPC                                                                                                                                         TREATMENT: PT Evaluation   Vitals:   08/30/23 1158  BP: (!) 164/91  Pulse: 61  Assessed in sitting in RUE at rest, BP elevated but within limits for participation  in PT eval.    PATIENT EDUCATION: Education details: Eval findings, PT POC, results of OM and functional implications Person educated: Patient Education method: Explanation and Demonstration Education comprehension: verbalized understanding, returned demonstration, and needs further education  HOME EXERCISE PROGRAM: To be initiated  GOALS: Goals reviewed with patient? Yes  SHORT TERM GOALS: Target  date: 09/27/2023  Pt will be independent with initial HEP for improved strength, balance, transfers and gait. Baseline: Goal status: INITIAL  2.  Pt will improve 5 x STS to less than or equal to 20 seconds to demonstrate improved functional strength and transfer efficiency.  Baseline: 24.22 sec no UE (3/19) Goal status: INITIAL  3.  Pt will improve gait velocity to at least 2.0 ft/sec for improved gait efficiency and performance at mod I level  Baseline: 1.86 ft/sec with SPC (3/19) Goal status: INITIAL  4.  Pt will improve normal TUG to less than or equal to 24 seconds for improved functional mobility and decreased fall risk. Baseline: 27 sec with SPC (3/19) Goal status: INITIAL  5.  Pt will improve Berg score to 40/56 for decreased fall risk Baseline: 35/56 (3/19) Goal status: INITIAL  6.  to be assessed and STG set Baseline:  Goal status: INITIAL  LONG TERM GOALS: Target date: 10/25/2023   Pt will be independent with final HEP for improved strength, balance, transfers and gait. Baseline:  Goal status: INITIAL  2.  Pt will improve 5 x STS to less than or equal to 15 seconds to demonstrate improved functional strength and transfer efficiency.  Baseline: 24.22 sec no UE (3/19) Goal status: INITIAL  3.  Pt will improve gait velocity to at least 2.25 ft/sec for improved gait efficiency and performance at mod I level  Baseline: 1.86 ft/sec with SPC (3/19) Goal status: INITIAL  4.  Pt will improve normal TUG to less than or equal to 21 seconds for improved functional  mobility and decreased fall risk. Baseline: 27 sec with SPC (3/19) Goal status: INITIAL  5.  Pt will improve Berg score to 45/56 for decreased fall risk Baseline: 35/56 (3/19) Goal status: INITIAL  6.  to be assessed and LTG set Baseline:  Goal status: INITIAL  ASSESSMENT:  CLINICAL IMPRESSION: Patient is a 68 year old female referred to Neuro OPPT for impairments secondary to her history of CVA.   Pt's PMH is significant for: arthritis, asthma, depression, anxiety, GERD, HTN, neuropathy, suicide attempt, DM, CVA with residual L side deficits, rectal cancer, COPD/emphysema, pulmonary fibrosis with baseline dyspnea and chronic cough. The following deficits were present during the exam: decreased LE strength, impaired balance, and impaired endurance. Based on her 5xSTS score of 24.44 sec, gait speed of 1.86 ft/sec, TUG score of 27 sec, and Berg score of 35/56, pt is an increased risk for falls. Pt would benefit from skilled PT to address these impairments and functional limitations to maximize functional mobility independence.   OBJECTIVE IMPAIRMENTS: Abnormal gait, cardiopulmonary status limiting activity, decreased activity tolerance, decreased balance, decreased endurance, decreased mobility, difficulty walking, decreased ROM, and decreased strength.   ACTIVITY LIMITATIONS: carrying, lifting, bending, squatting, and stairs  PARTICIPATION LIMITATIONS: meal prep, cleaning, laundry, and driving  PERSONAL FACTORS: Time since onset of injury/illness/exacerbation and 3+ comorbidities:    arthritis, asthma, depression, anxiety, GERD, HTN, neuropathy, suicide attempt, DM, CVA with residual L side deficits, rectal cancer, COPD/emphysema, pulmonary fibrosis with baseline dyspnea and chronic coughare also affecting patient's functional outcome.   REHAB POTENTIAL: Fair time since onset, history of multiple CVAs  CLINICAL DECISION MAKING: Evolving/moderate complexity  EVALUATION COMPLEXITY:  Moderate  PLAN:  PT FREQUENCY: 2x/week  PT DURATION: 6 weeks  PLANNED INTERVENTIONS: 97164- PT Re-evaluation, 97110-Therapeutic exercises, 97530- Therapeutic activity, [id number]- Neuromuscular re-education, 97535- Self Care, [id number]- Manual therapy, [id number]- Gait training, [phone]- Orthotic Fit/training, [phone]- Electrical stimulation (manual), Patient/Family  education, Balance training, Stair training, Taping, Dry Needling, DME instructions, Cryotherapy, and Moist heat  PLAN FOR NEXT SESSION: cancel some PT visits at end of her schedule because she got scheduled for too many and past her cert date, assess and write STG/LTG, assess BP, initiate HEP to address balance, strength, and endurance; LLE NMR; AFO vs continue to use foot up brace?   Peter Congo, PT Peter Congo, PT, DPT, CSRS  08/30/2023, 12:29 PM

## 2023-08-30 NOTE — Therapy (Signed)
 OUTPATIENT SPEECH LANGUAGE PATHOLOGY EVALUATION   Patient Name: Erica [name] MRN: [id number] DOB:Oct 25, 1955, 68 y.o., female Today's Date: 08/30/2023  PCP: Erica Oaks NP REFERRING PROVIDER: Ihor Austin, NP  END OF SESSION:  End of Session - 08/30/23 1250     Visit Number 1    Number of Visits 17    Date for SLP Re-Evaluation 11/08/23   extended for scheduling   Authorization Type [employer] -auth requested    SLP Start Time 1230    SLP Stop Time  1300   pt request d/t transportation   SLP Time Calculation (min) 30 min    Activity Tolerance Patient tolerated treatment well             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   GERD (gastroesophageal reflux disease)    with chronic cough   Headache(784.0)    "mild"   Hypertension    Loop Biotronik loop implant 12/16/2020 12/16/2020   Mental disorder    depression   Neuropathy    feet    Pain    arthritis pain - takes tramadol as needed   Pulmonary fibrosis, unspecified (HCC)    Rectal cancer (HCC)    Rectal polyp    very little bleeding with bowel movements- no pain   Stroke (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   Suicide attempt [facility])    Past Surgical History:  Procedure Laterality Date   ANAL RECTAL MANOMETRY N/A 07/13/2016   Procedure: ANO RECTAL MANOMETRY;  Surgeon: Erica Levee, MD;  Location: [facility];  Service: Endoscopy;  Laterality: N/A;   CESAREAN SECTION     EUS N/A 11/21/2012   Procedure: LOWER ENDOSCOPIC ULTRASOUND (EUS);  Surgeon: Erica Modena, MD;  Location: [facility];  Service: Endoscopy;  Laterality: N/A;   FLEXIBLE SIGMOIDOSCOPY N/A 11/21/2012   Procedure: FLEXIBLE SIGMOIDOSCOPY;  Surgeon: Erica Modena, MD;  Location: [facility];  Service: Endoscopy;  Laterality: N/A;   FLEXIBLE SIGMOIDOSCOPY N/A 01/28/2013    Procedure: FLEXIBLE SIGMOIDOSCOPY;  Surgeon: Erica Levee, MD;  Location: [facility];  Service: Endoscopy;  Laterality: N/A;   LAPAROSCOPIC LOW ANTERIOR RESECTION N/A 01/29/2013   Procedure: LAPAROSCOPIC LOW ANTERIOR RESECTION, Rigid Proctoscopy;  Surgeon: Erica Levee, MD;  Location: [facility];  Service: General;  Laterality: N/A;   LAPAROSCOPIC SIGMOID COLECTOMY N/A 11/14/2012   Procedure: DIAGNOSTIC LAPAROSCOPY AND SIGMOIDMOIDOSCOPY ;  Surgeon: Erica Loron, MD;  Location: [facility];  Service: General;  Laterality: N/A;   RECTAL ULTRASOUND N/A 07/13/2016   Procedure: RECTAL ULTRASOUND;  Surgeon: Erica Levee, MD;  Location: [facility];  Service: Endoscopy;  Laterality: N/A;   TONSILLECTOMY     TONSILLECTOMY AND ADENOIDECTOMY     Patient Active Problem List   Diagnosis Date Noted   Syncope 03/18/2023   Stroke (cerebrum) (HCC) 03/08/2023   Acute ischemic stroke (HCC) 03/05/2023   Stroke-like symptom 03/04/2023   Research study patient 07/15/2022   Encounter for loop recorder check 01/02/2021   Loop Biotronik loop implant 12/16/2020 12/16/2020   Leukopenia    Controlled type 2 diabetes mellitus with hyperglycemia, with long-term current use of insulin (HCC)    Vascular headache    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   Sinus bradycardia on ECG 11/03/2020   Benign essential HTN  Pancytopenia (HCC)    Stage 3b chronic kidney disease (HCC)    Anemia    Labile blood glucose    Labile blood pressure    Diabetic peripheral neuropathy (HCC)    Essential hypertension    AKI (acute kidney injury) (HCC)    Ataxia, late effect of cerebrovascular disease    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   Acute CVA (cerebrovascular accident) (HCC) 06/26/2020   CVA (cerebral vascular accident) (HCC) 06/25/2020   BMI 32.0-32.9,adult 06/03/2020   Left upper lobe pneumonia 03/30/2020   Chest pain 10/01/2019   MDD (major depressive disorder), recurrent  severe, 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   Suicide attempt (HCC) 05/09/2019   Cocaine abuse (HCC) 05/09/2019   Interstitial lung disease (HCC) 06/21/2016   COPD (chronic obstructive pulmonary disease) (HCC) 06/21/2016   Cough 06/21/2016   GERD (gastroesophageal reflux disease) 06/21/2016   Hypersomnia 06/21/2016   Tobacco user 06/21/2016   Rectal cancer (HCC) 01/30/2013    ONSET DATE: 07/06/23 (referral date)   REFERRING DIAG: I63.9 (ICD-10-CM) - Recurrent strokes (HCC) D17.616 (ICD-10-CM) - Dysarthria as late effect of stroke  THERAPY DIAG: Cognitive communication deficit  Dysarthria and anarthria  Rationale for Evaluation and Treatment: Rehabilitation  SUBJECTIVE:   SUBJECTIVE STATEMENT: "I can't get words out I need to get" "I forget a lot" Pt accompanied by: self  PERTINENT HISTORY: "Erica [name] is a very pleasant 68 yo [place] American female with history of recurrent ischemic stroke.  She had 2 strokes in 2022 (January and May), again in September 2024 and then October 2024."  PAIN: Are you having pain? No  FALLS: Has patient fallen in last 6 months?  No  LIVING ENVIRONMENT: Lives with: lives alone (family provides assistance) Lives in: House/apartment  PLOF:  Level of assistance: Needed assistance with ADLs, Needed assistance with IADLS Employment: Retired  PATIENT GOALS: improve cognition and communication  OBJECTIVE:  Note: Objective measures were completed at Evaluation unless otherwise noted.  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 Family  is managing finances and organizing pill box  AUDITORY COMPREHENSION: Overall auditory comprehension: Appears intact for simple conversation YES/NO questions: Appears intact Following directions: Impaired: moderately complex Conversation: Simple Interfering components: attention, awareness, visual impairments, motor planning, processing speed, and working memory  READING COMPREHENSION: Intact  EXPRESSION: verbal  VERBAL EXPRESSION: Level of generative/spontaneous verbalization: conversation Automatic speech: name: intact and social response: intact  Naming: Confrontation: 76-100% and Divergent: 26-50% Interfering components: attention, premorbid deficit, and speech intelligibility  MOTOR SPEECH: Level of impairment: conversation  Comment: previously received CIR ST for mild dysarthria. No overt dysarthria exhibited today given time constraints but may need formal/additional assessment in upcoming sessions   STANDARDIZED ASSESSMENTS: CLQT initiated; will complete at next visit  PATIENT REPORTED OUTCOME MEASURES (PROM): Not completed d/t time constraints (complete first session)  TREATMENT DATE:  08-30-23: Eval only  PATIENT EDUCATION: Education details: POC Person educated: Patient Education method: Explanation Education comprehension: verbalized understanding   GOALS: Goals reviewed with patient? Yes  SHORT TERM GOALS: Target date: 09/27/2023   Pt will complete CLQT and PROM in first 1-2 sessions Baseline: Goal status: INITIAL  2.  Pt will utilize trained memory compensations to aid functional recall (I.e., feed dog, take meds, recall grocery lists) x3 opportunities with mod I Baseline:  Goal status: INITIAL  3.  Pt will effectively utilize attention/processing strategies to aid thought/topic maintenance with occasional mod A in therapy  Baseline:   Goal status: INITIAL  4.  Pt will utilize anomia strategies to aid word retrieval during structured conversations with occasional mod-A in therapy  Baseline:  Goal status: INITIAL   LONG TERM GOALS: Target date: 11/08/2023  Pt will carryover cognitive strategies to improve daily functioning and completion of daily tasks given with rare mod-A from family  Baseline:  Goal status: INITIAL  2.  Pt will carryover word retrieval strategies during unstructured conversations x2 with occasional min-A Baseline:  Goal status: INITIAL  3.  Pt will improve PROM score by 2 points by LTG date Baseline:  Goal status: INITIAL    ASSESSMENT:  CLINICAL IMPRESSION: Patient is a 68 y.o. F who was seen today for ST evaluation s/p recurrent strokes. Pt reports concerns with frequent word finding episodes, losing train of thought in conversation, and difficulty recalling daily responsibilities, such as feeding the dog, locking the door, forgetting items, and taking medications. Indicated occasional stutter, which was not evidenced today. Occasional imprecision noted in conversation (possibly correlated to accent) but will continue to monitor for dysarthria and dysfluency. CLQT initiated today and will complete next session due to time constraints. Clinical observations include delayed processing, reduced recall of story and multi-step instructions, and word retrieval deficits impacting generative naming, symbol trials, and story retell. Questions presence of language disorder versus cognitive communication impairment given overt deficits related to memory, attention, and executive functioning. Pt would benefit from skilled ST intervention to optimize cognitive linguistic functioning.   OBJECTIVE IMPAIRMENTS: include attention, memory, executive functioning, and expressive language. These impairments are limiting patient from household responsibilities and effectively communicating at home and in community.  Factors affecting potential to achieve goals and functional outcome are ability to learn/carryover information, medical prognosis, and previous level of function. Patient will benefit from skilled SLP services to address above impairments and improve overall function.  REHAB POTENTIAL: Good  PLAN:  SLP FREQUENCY: 1-2x/week  SLP DURATION: 10 weeks (extended for scheduling)  PLANNED INTERVENTIONS: Language facilitation, Environmental controls, Cueing hierachy, Cognitive reorganization, Functional tasks, SLP instruction and feedback, Compensatory strategies, Patient/family education, and [id number] Treatment of speech (30 or 45 min)     Gracy Racer, CCC-SLP 08/30/2023, 2:21 PM

## 2023-09-11 ENCOUNTER — Encounter: Payer: Self-pay | Admitting: Physical Therapy

## 2023-09-11 ENCOUNTER — Ambulatory Visit: Admitting: Physical Therapy

## 2023-09-11 ENCOUNTER — Ambulatory Visit: Admitting: Occupational Therapy

## 2023-09-11 DIAGNOSIS — M6281 Muscle weakness (generalized): Secondary | ICD-10-CM

## 2023-09-11 DIAGNOSIS — R2689 Other abnormalities of gait and mobility: Secondary | ICD-10-CM | POA: Diagnosis not present

## 2023-09-11 DIAGNOSIS — R29818 Other symptoms and signs involving the nervous system: Secondary | ICD-10-CM

## 2023-09-11 DIAGNOSIS — R278 Other lack of coordination: Secondary | ICD-10-CM

## 2023-09-11 DIAGNOSIS — R2681 Unsteadiness on feet: Secondary | ICD-10-CM

## 2023-09-11 NOTE — Therapy (Signed)
 OUTPATIENT PHYSICAL THERAPY NEURO TREATMENT   Patient Name: Erica [name] MRN: [id number] DOB:May 20, 1956, 68 y.o., female Today's Date: 09/11/2023   PCP: Estevan Oaks, NP REFERRING PROVIDER: Estevan Oaks, NP  END OF SESSION:  PT End of Session - 09/11/23 1021     Visit Number 2    Number of Visits 13   with eval   Date for PT Re-Evaluation 10/25/23   to allow for scheduling delays   Authorization Type [employer]    Authorization Time Period Approved 13 PT visits 08/30/23 - 10/25/23    PT Start Time 1021   pt in restroom   PT Stop Time 1059    PT Time Calculation (min) 38 min    Equipment Utilized During Treatment Gait belt    Activity Tolerance Patient tolerated treatment well    Behavior During Therapy WFL for tasks assessed/performed             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   GERD (gastroesophageal reflux disease)    with chronic cough   Headache(784.0)    "mild"   Hypertension    Loop Biotronik loop implant 12/16/2020 12/16/2020   Mental disorder    depression   Neuropathy    feet    Pain    arthritis pain - takes tramadol as needed   Pulmonary fibrosis, unspecified (HCC)    Rectal cancer (HCC)    Rectal polyp    very little bleeding with bowel movements- no pain   Stroke (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   Suicide attempt [facility])    Past Surgical History:  Procedure Laterality Date   ANAL RECTAL MANOMETRY N/A 07/13/2016   Procedure: ANO RECTAL MANOMETRY;  Surgeon: Romie Levee, MD;  Location: [facility];  Service: Endoscopy;  Laterality: N/A;   CESAREAN SECTION     EUS N/A 11/21/2012   Procedure: LOWER ENDOSCOPIC ULTRASOUND (EUS);  Surgeon: Willis Modena, MD;  Location: [facility];  Service: Endoscopy;  Laterality: N/A;   FLEXIBLE SIGMOIDOSCOPY N/A 11/21/2012    Procedure: FLEXIBLE SIGMOIDOSCOPY;  Surgeon: Willis Modena, MD;  Location: [facility];  Service: Endoscopy;  Laterality: N/A;   FLEXIBLE SIGMOIDOSCOPY N/A 01/28/2013   Procedure: FLEXIBLE SIGMOIDOSCOPY;  Surgeon: Romie Levee, MD;  Location: [facility];  Service: Endoscopy;  Laterality: N/A;   LAPAROSCOPIC LOW ANTERIOR RESECTION N/A 01/29/2013   Procedure: LAPAROSCOPIC LOW ANTERIOR RESECTION, Rigid Proctoscopy;  Surgeon: Romie Levee, MD;  Location: [facility];  Service: General;  Laterality: N/A;   LAPAROSCOPIC SIGMOID COLECTOMY N/A 11/14/2012   Procedure: DIAGNOSTIC LAPAROSCOPY AND SIGMOIDMOIDOSCOPY ;  Surgeon: Emelia Loron, MD;  Location: [facility];  Service: General;  Laterality: N/A;   RECTAL ULTRASOUND N/A 07/13/2016   Procedure: RECTAL ULTRASOUND;  Surgeon: Romie Levee, MD;  Location: [facility];  Service: Endoscopy;  Laterality: N/A;   TONSILLECTOMY     TONSILLECTOMY AND ADENOIDECTOMY     Patient Active Problem List   Diagnosis Date Noted   Syncope 03/18/2023   Stroke (cerebrum) (HCC) 03/08/2023   Acute ischemic stroke (HCC) 03/05/2023   Stroke-like symptom 03/04/2023   Research study patient 07/15/2022   Encounter for loop recorder check 01/02/2021   Loop Biotronik loop implant 12/16/2020 12/16/2020   Leukopenia    Controlled type 2 diabetes mellitus with hyperglycemia, with long-term current  use of insulin (HCC)    Vascular headache    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   Sinus bradycardia on ECG 11/03/2020   Benign essential HTN    Pancytopenia (HCC)    Stage 3b chronic kidney disease (HCC)    Anemia    Labile blood glucose    Labile blood pressure    Diabetic peripheral neuropathy (HCC)    Essential hypertension    AKI (acute kidney injury) (HCC)    Ataxia, late effect of cerebrovascular disease    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   Acute CVA (cerebrovascular accident) (HCC) 06/26/2020   CVA (cerebral vascular  accident) (HCC) 06/25/2020   BMI 32.0-32.9,adult 06/03/2020   Left upper lobe pneumonia 03/30/2020   Chest pain 10/01/2019   MDD (major depressive disorder), recurrent severe, 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   Suicide attempt (HCC) 05/09/2019   Cocaine abuse (HCC) 05/09/2019   Interstitial lung disease (HCC) 06/21/2016   COPD (chronic obstructive pulmonary disease) (HCC) 06/21/2016   Cough 06/21/2016   GERD (gastroesophageal reflux disease) 06/21/2016   Hypersomnia 06/21/2016   Tobacco user 06/21/2016   Rectal cancer (HCC) 01/30/2013    ONSET DATE: 07/04/2023 (referral date)  REFERRING DIAG: Z86.73 (ICD-10-CM) - Personal history of transient ischemic attack I69.354 (ICD-10-CM) - Hemiparesis affecting left side as late effect of cerebrovascular accident (HCC) M15.9 (ICD-10-CM) - Generalized osteoarthrosis, involving multiple sites  THERAPY DIAG:  Muscle weakness (generalized)  Other symptoms and signs involving the nervous system  Other abnormalities of gait and mobility  Unsteadiness on feet  Rationale for Evaluation and Treatment: Rehabilitation  SUBJECTIVE:                                                                                                                                                                                             SUBJECTIVE STATEMENT: No changes since she was last here, no falls. Pt reporting mainly doing upper body exercises with soup cans and a ball at home.    Pt accompanied by: self, took [facility] [place] to her appointment today  PERTINENT HISTORY: arthritis, asthma, depression, anxiety, GERD, HTN, neuropathy, suicide attempt, DM, CVA with residual L side deficits, rectal cancer, COPD/emphysema, pulmonary fibrosis with baseline dyspnea and chronic cough  Per Erica [name] note 05/16/23: Erica [name] is a 68 y.o. year old female with recent acute to subacute bilateral centrum semiovale infarcts 02/12/2023 and left thalamic infarct on 03/18/2023  likely secondary to small vessel disease.  History of recurrent right MCA strokes on 11/03/2020 and 06/25/2020 likely in setting of small vessel disease although unable to rule out  cardioembolic source with residual left hemiparesis and sensory impairment.  Loop recorder placed 12/2020 by Dr. Odis Hollingshead [facility] cardiology).  Vascular risk factors include HTN, HLD, DM, CAD, history of tobacco use and history of polysubstance abuse and multiple prior strokes noted on imaging.   PAIN:  Are you having pain? No  PRECAUTIONS: Fall and Other: loop recorder  RED FLAGS: None   WEIGHT BEARING RESTRICTIONS: No  FALLS: Has patient fallen in last 6 months? No  LIVING ENVIRONMENT: Lives with: lives alone, sister and son come check on her Lives in: House/apartment Stairs: No Has following equipment at home: Single point cane, Environmental consultant - 2 wheeled, and Wheelchair (manual)  PLOF: Independent with gait, Independent with transfers, and Requires assistive device for independence  PATIENT GOALS: "to learn to walk faster so I can start walking around the corner"  OBJECTIVE:  Note: Objective measures were completed at Evaluation unless otherwise noted.  DIAGNOSTIC FINDINGS:  Brain MRI 03/18/2023 IMPRESSION: 1. Acute punctate nonhemorrhagic infarct of the left thalamus. 2. Previously noted subcortical infarcts of the centrum semi ovale demonstrate expected change in are only faintly seen on the fusion weighted images without definite restricted diffusion. 3. 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 4. Remote watershed territory infarcts and diffuse subcortical white matter disease is also stable. 5. Remote lacunar infarcts of the basal ganglia and cerebellum bilaterally are stable.  COGNITION: Overall cognitive status: Within functional limits for tasks assessed   SENSATION: N/T in L hand  Light touch decreased on LLE as compared to RLE  COORDINATION: decreased in LLE  due to strength impairments  EDEMA:  Occasional edema if she doesn't wear her support hose  POSTURE: rounded shoulders, forward head, and posterior pelvic tilt  LOWER EXTREMITY ROM:     Active  Right Eval Left Eval  Hip flexion    Hip extension    Hip abduction    Hip adduction    Hip internal rotation    Hip external rotation    Knee flexion    Knee extension    Ankle dorsiflexion  decreased  Ankle plantarflexion    Ankle inversion    Ankle eversion     (Blank rows = not tested)  LOWER EXTREMITY MMT:    MMT Right Eval Left Eval  Hip flexion 5 4  Hip extension    Hip abduction    Hip adduction    Hip internal rotation    Hip external rotation    Knee flexion 5 4  Knee extension 5 4  Ankle dorsiflexion 4 2-  Ankle plantarflexion    Ankle inversion    Ankle eversion    (Blank rows = not tested)  BED MOBILITY:  Mod I per patient report; uses step stool as her bed is high  TRANSFERS: Assistive device utilized: Single point cane  Sit to stand: Modified independence Stand to sit: Modified independence Chair to chair: Modified independence Floor:  not assessed at eval  GAIT: Gait pattern: decreased step length- Right, decreased step length- Left, decreased stride length, decreased hip/knee flexion- Right, decreased hip/knee flexion- Left, decreased ankle dorsiflexion- Right, decreased ankle dorsiflexion- Left, shuffling, trunk flexed, and narrow BOS Distance walked: various clinic distances Assistive device utilized: Single point cane Level of assistance: Modified independence  TREATMENT:   Therapeutic Activity:    [facility] PT Assessment - 09/11/23 1023       6 Minute Walk- Baseline   6 Minute Walk- Baseline yes    BP (mmHg) 151/75    HR (bpm) 71      6 Minute walk- Post Test   6 Minute Walk Post Test yes    BP (mmHg) 160/80     HR (bpm) 75    Modified Borg Scale for Dyspnea 2- Mild shortness of breath      6 minute walk test results    Aerobic Endurance Distance Walked 450    Endurance additional comments with SPC, CGA with one episode of min A due to pt's L foot almost catching due to fatigue. Pt needing a rest break at 4:15 due to fatigue and taking a seated rest until 5:00. Pt reporting RPE as 7/10 afterwards. LLE was getting more fatigued            Discussed use and purpose of L AFO to help with foot clearance during gait vs. Foot up brace, pt reports she does not have one and would be willing to try in a future session.   NMR:   Access Code: [email or web address] URL: [email or web address] Date: 09/11/2023 Prepared by: Sherlie Ban  Initiated HEP for LLE strength and standing balance  Exercises - Staggered Sit-to-Stand (Mirrored)  - 1-2 x daily - 5 x weekly - 2 sets - 10 reps - with LLE staggered posteriorly, pt using mainly BUE support to stand and cued to try to sit down without using hands  - Standing March with Counter Support  - 1-2 x daily - 5 x weekly - 2 sets - 10 reps - Heel Toe Raises with Counter Support  - 1-2 x daily - 5 x weekly - 2 sets - 10 reps  PATIENT EDUCATION: Education details: findings, initial HEP, trying AFO in a future session with gait Person educated: Patient Education method: Explanation and Demonstration Education comprehension: verbalized understanding, returned demonstration, and needs further education  HOME EXERCISE PROGRAM: Access Code: [email or web address] URL: [email or web address] Date: 09/11/2023 Prepared by: Sherlie Ban  Exercises - Staggered Sit-to-Stand (Mirrored)  - 1-2 x daily - 5 x weekly - 2 sets - 10 reps - Standing March with Counter Support  - 1-2 x daily - 5 x weekly - 2 sets - 10 reps - Heel Toe Raises with Counter Support  - 1-2 x daily - 5 x weekly - 2 sets - 10 reps  GOALS: Goals reviewed with patient? Yes  SHORT TERM  GOALS: Target date: 09/27/2023  Pt will be independent with initial HEP for improved strength, balance, transfers and gait. Baseline: Goal status: INITIAL  2.  Pt will improve 5 x STS to less than or equal to 20 seconds to demonstrate improved functional strength and transfer efficiency.  Baseline: 24.22 sec no UE (3/19) Goal status: INITIAL  3.  Pt will improve gait velocity to at least 2.0 ft/sec for improved gait efficiency and performance at mod I level  Baseline: 1.86 ft/sec with SPC (3/19) Goal status: INITIAL  4.  Pt will improve normal TUG to less than or equal to 24 seconds for improved functional mobility and decreased fall risk. Baseline: 27 sec with SPC (3/19) Goal status: INITIAL  5.  Pt will improve Berg score to 40/56 for decreased fall risk Baseline: 35/56 (3/19) Goal status: INITIAL  6.  to be assessed and STG set Baseline: 450' with [facility]  Goal status: MET  LONG TERM GOALS: Target date: 10/25/2023   Pt will be independent with final HEP for improved strength, balance, transfers and gait. Baseline:  Goal status: INITIAL  2.  Pt will improve 5 x STS to less than or equal to 15 seconds to demonstrate improved functional strength and transfer efficiency.  Baseline: 24.22 sec no UE (3/19) Goal status: INITIAL  3.  Pt will improve gait velocity to at least 2.25 ft/sec for improved gait efficiency and performance at mod I level  Baseline: 1.86 ft/sec with SPC (3/19) Goal status: INITIAL  4.  Pt will improve normal TUG to less than or equal to 21 seconds for improved functional mobility and decreased fall risk. Baseline: 27 sec with SPC (3/19) Goal status: INITIAL  5.  Pt will improve Berg score to 45/56 for decreased fall risk Baseline: 35/56 (3/19) Goal status: INITIAL  6.  Pt will ambulate at least 525' with with SPC vs. RW with no seated rest break and reporting RPE as 5/10 or less in order to demo improved gait endurance.  Baseline: 450' with SPC  and CGA Goal status: INITIAL  ASSESSMENT:  CLINICAL IMPRESSION: Assessed today with use of SPC with pt needing CGA for gait and one episode of min A due to pt almost tripping over LLE. Pt needing to take a 45 second seated rest break during due to fatigue. Pt able to ambulate 450' in 6 minutes and pt reporting a 7/10 RPE afterwards. LTG updated as appropriate. Discussed trialing a L AFO in future sessions to help with foot clearance with pt in agreement with plan. Remainder of session focused on initiating HEP for LLE strength and standing balance at countertop. Will continue per POC.    OBJECTIVE IMPAIRMENTS: Abnormal gait, cardiopulmonary status limiting activity, decreased activity tolerance, decreased balance, decreased endurance, decreased mobility, difficulty walking, decreased ROM, and decreased strength.   ACTIVITY LIMITATIONS: carrying, lifting, bending, squatting, and stairs  PARTICIPATION LIMITATIONS: meal prep, cleaning, laundry, and driving  PERSONAL FACTORS: Time since onset of injury/illness/exacerbation and 3+ comorbidities:    arthritis, asthma, depression, anxiety, GERD, HTN, neuropathy, suicide attempt, DM, CVA with residual L side deficits, rectal cancer, COPD/emphysema, pulmonary fibrosis with baseline dyspnea and chronic coughare also affecting patient's functional outcome.   REHAB POTENTIAL: Fair time since onset, history of multiple CVAs  CLINICAL DECISION MAKING: Evolving/moderate complexity  EVALUATION COMPLEXITY: Moderate  PLAN:  PT FREQUENCY: 2x/week  PT DURATION: 6 weeks  PLANNED INTERVENTIONS: 97164- PT Re-evaluation, 97110-Therapeutic exercises, 97530- Therapeutic activity, 97112- Neuromuscular re-education, 97535- Self Care, 16109- Manual therapy, [id number]- Gait training, [phone]- Orthotic Fit/training, [phone]- Electrical stimulation (manual), Patient/Family education, Balance training, Stair training, Taping, Dry Needling, DME instructions, Cryotherapy,  and Moist heat  PLAN FOR NEXT SESSION: cancel some PT visits at end of her schedule because she got scheduled for too many and past her cert date,   assess BP, add to HEP HEP to address balance, strength, and endurance; LLE NMR; trial an AFO and compare to L foot up brace    Sherlie Ban, PT, DPT 09/11/23 11:00 AM

## 2023-09-11 NOTE — Therapy (Signed)
 OUTPATIENT OCCUPATIONAL THERAPY NEURO TREATMENT  Patient Name: Erica [name] MRN: [id number] DOB:03/12/1956, 68 y.o., female Today's Date: 09/11/2023  PCP: Estevan Oaks, NP  REFERRING PROVIDER: Estevan Oaks, NP   END OF SESSION:  OT End of Session - 09/11/23 [phone]     Visit Number 2    Number of Visits 16    Date for OT Re-Evaluation 10/30/23    Authorization Type [employer] MCR primary, MCD secondary    Progress Note Due on Visit 10    OT Start Time [id number]    OT Stop Time 1016    OT Time Calculation (min) 38 min    Equipment Utilized During Treatment FM objects    Activity Tolerance Patient tolerated treatment well    Behavior During Therapy WFL for tasks assessed/performed             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   GERD (gastroesophageal reflux disease)    with chronic cough   Headache(784.0)    "mild"   Hypertension    Loop Biotronik loop implant 12/16/2020 12/16/2020   Mental disorder    depression   Neuropathy    feet    Pain    arthritis pain - takes tramadol as needed   Pulmonary fibrosis, unspecified (HCC)    Rectal cancer (HCC)    Rectal polyp    very little bleeding with bowel movements- no pain   Stroke (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   Suicide attempt [facility])    Past Surgical History:  Procedure Laterality Date   ANAL RECTAL MANOMETRY N/A 07/13/2016   Procedure: ANO RECTAL MANOMETRY;  Surgeon: Romie Levee, MD;  Location: [facility];  Service: Endoscopy;  Laterality: N/A;   CESAREAN SECTION     EUS N/A 11/21/2012   Procedure: LOWER ENDOSCOPIC ULTRASOUND (EUS);  Surgeon: Willis Modena, MD;  Location: [facility];  Service: Endoscopy;  Laterality: N/A;   FLEXIBLE SIGMOIDOSCOPY N/A 11/21/2012   Procedure: FLEXIBLE SIGMOIDOSCOPY;  Surgeon: Willis Modena, MD;  Location: [facility]  ENDOS[facility];  Service: Endoscopy;  Laterality: N/A;   FLEXIBLE SIGMOIDOSCOPY N/A 01/28/2013   Procedure: FLEXIBLE SIGMOIDOSCOPY;  Surgeon: Romie Levee, MD;  Location: [facility];  Service: Endoscopy;  Laterality: N/A;   LAPAROSCOPIC LOW ANTERIOR RESECTION N/A 01/29/2013   Procedure: LAPAROSCOPIC LOW ANTERIOR RESECTION, Rigid Proctoscopy;  Surgeon: Romie Levee, MD;  Location: [facility];  Service: General;  Laterality: N/A;   LAPAROSCOPIC SIGMOID COLECTOMY N/A 11/14/2012   Procedure: DIAGNOSTIC LAPAROSCOPY AND SIGMOIDMOIDOSCOPY ;  Surgeon: Emelia Loron, MD;  Location: [facility];  Service: General;  Laterality: N/A;   RECTAL ULTRASOUND N/A 07/13/2016   Procedure: RECTAL ULTRASOUND;  Surgeon: Romie Levee, MD;  Location: [facility];  Service: Endoscopy;  Laterality: N/A;   TONSILLECTOMY     TONSILLECTOMY AND ADENOIDECTOMY     Patient Active Problem List   Diagnosis Date Noted   Syncope 03/18/2023   Stroke (cerebrum) (HCC) 03/08/2023   Acute ischemic stroke (HCC) 03/05/2023   Stroke-like symptom 03/04/2023   Research study patient 07/15/2022   Encounter for loop recorder check 01/02/2021   Loop Biotronik loop implant 12/16/2020 12/16/2020   Leukopenia    Controlled type 2 diabetes mellitus with hyperglycemia, with long-term current use of insulin (HCC)    Vascular headache    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   Sinus bradycardia on ECG 11/03/2020   Benign essential HTN    Pancytopenia (HCC)    Stage 3b chronic kidney disease (HCC)    Anemia    Labile blood glucose    Labile blood pressure    Diabetic peripheral neuropathy (HCC)    Essential hypertension    AKI (acute kidney injury) (HCC)    Ataxia, late effect of cerebrovascular disease    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   Acute CVA (cerebrovascular accident) (HCC) 06/26/2020   CVA (cerebral vascular accident) (HCC) 06/25/2020   BMI 32.0-32.9,adult 06/03/2020   Left upper lobe  pneumonia 03/30/2020   Chest pain 10/01/2019   MDD (major depressive disorder), recurrent severe, 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   Suicide attempt (HCC) 05/09/2019   Cocaine abuse (HCC) 05/09/2019   Interstitial lung disease (HCC) 06/21/2016   COPD (chronic obstructive pulmonary disease) (HCC) 06/21/2016   Cough 06/21/2016   GERD (gastroesophageal reflux disease) 06/21/2016   Hypersomnia 06/21/2016   Tobacco user 06/21/2016   Rectal cancer (HCC) 01/30/2013    ONSET DATE: 07/04/23 (referral date)  REFERRING DIAG:  Z86.73 (ICD-10-CM) - Personal history of transient cerebral ischemia  I69.354 (ICD-10-CM) - Hemiparesis affecting left side as late effect of cerebrovascular accident (HCC)  M15.9 (ICD-10-CM) - Generalized osteoarthrosis, involving multiple sites    THERAPY DIAG:  Other lack of coordination  Muscle weakness (generalized)  Other symptoms and signs involving the nervous system  Rationale for Evaluation and Treatment: Rehabilitation  SUBJECTIVE:   SUBJECTIVE STATEMENT: Pt reports she has the pollen sniffles but otherwise feeling pretty good.   Pt reports she did make an appt with the eye doctor appt in May.  When asked about hobbies, pt reports she used to like to bowl and played cards - spades, uno.  Pt accompanied by: self  PERTINENT HISTORY: arthritis especially in shoulders, asthma, COPD, depression, DM, hx of multiple strokes, GERD, HTN, loop recorder implant 12/16/20, neuropathy, hx of rectal cancer, pulmonary fibrosis, suicide attempt  Per 06/21/23 NP Progress Notes: history of recurrent ischemic stroke.  She had 2 strokes in 2022 (January and May), again in September 2024 and then October 2024.  She is currently off Plavix and only on aspirin 81 mg PO daily.  She had a LOOP recorder placed in July 2022... She quit smoking 3 years ago after her first stroke... She has remote history of rectal cancer stage I (T1, N0, cM0) followed by Dr. Mosetta Putt... She has mild  weakness on the left side including foot drop. Tingling noted in the left side as well post stroke.  Tingling in hands with cold    PRECAUTIONS: Fall and Other: loop implant, no driving    WEIGHT BEARING RESTRICTIONS: No  PAIN:  Are you having pain?  No but wears glove Lt hand d/t feeling "cold"   FALLS: Has patient fallen in last 6 months? No  LIVING ENVIRONMENT: Lives with: lives alone (24 hr care from family) but looking into an Assisted Living Facility Lives in: apartment on first  floor, washer/dryer in apt Has following equipment at home: Single point cane, Environmental consultant - 2 wheeled, Wheelchair (manual), shower chair, and bed side commode  PLOF: Independent with basic ADLs prior to December  PATIENT GOALS: speech primary goal, balance and Lt hand/UE secondary  OBJECTIVE:  Note: Objective measures were completed at Evaluation unless otherwise noted.  HAND DOMINANCE: Right  ADLs: Overall ADLs: overall mod I to supervision for  BADLs Transfers/ambulation related to ADLs: walks w/ cane in house Eating: difficulty cutting food Grooming: mod I  UB Dressing: mod I  LB Dressing: mod I  Toileting: mod I  Bathing: usually has supervision and occasional assist to wash back, but not always - therapist recommended ALWAYS having someone there when she is showering for safety reasons Tub Shower transfers: close supervision Equipment: Shower seat with back and Grab bars  IADLs: Shopping: pt uses scooter, sister drives pt to store Light housekeeping: does light cleaning (swifer, washes dishes, laundry) - pt's sister does vacuuming/heavier cleaning Meal Prep: pt orders out a lot, does microwave, cold prep meal Community mobility: relies on sister or [employer], public transportation Medication management: pt's sister places meds in pillbox each week Financial management: son takes care of bills Handwriting: 100% legible and print (some expressive aphasia)  MOBILITY STATUS:  uses cane in apt,  walker more when out   UPPER EXTREMITY ROM:  RUE WNL's, LUE WFL's but does have to focus/attend to Lt side d/t decreased sensation and attention to Lt side.   UPPER EXTREMITY MMT:   not tested d/t previous Lt shoulder issues   HAND FUNCTION: Grip strength: Right: 38.1 lbs; Left: 35.4 lbs  COORDINATION: 9 Hole Peg test: Right: 26.64 sec; Left: 40.29 sec  SENSATION: Light touch: WFL 2 pt discrimination impaired  Lt hand w/ Rt hand simultaneous stimulation Lt hand feels cold, numbness/tingling Reports dropping things from Lt hand  EDEMA: none in hands   COGNITION: Overall cognitive status:  word finding difficulties (mild expressive and ? Some receptive aphasia vs. Slower processing info), pt also reports memory deficits and reports being diagnosed with dementia  VISION: Subjective report: I wear glasses but I lost them. My Lt eye doesn't work as well Baseline vision: Wears glasses all the time (but lost them) Visual history:  none reported  VISION ASSESSMENT: To be further assessed in functional context ? Lt peripheral loss   PERCEPTION: Not tested  PRAXIS: Not tested  OBSERVATIONS: reports dropping things from Lt hand, wears copper glove Lt hand d/t feeling "cold"                                                                                                                            TREATMENT DATE: 09/11/23    Coordination Exercise/Activity handout with images provided for various activities to work on L UE finger ROM, dexterity and isolated movements with demonstration and practice, as well as modification, hand over hand guidance and cues throughout to improve technique, digital isolation and ease of performing task.  Tasks included:  Pick up coins, dominoes, buttons, marbles, checkers, dice etc of different sizes ... To place in containers To stack - with guidance to work on include/isolate specific fingers. To pick up items one at a time until patient got 5+ in  their hand and then move item from palm to fingertips to release ie) Finger-to-palm then palm-to-finger translation of small items - Options  to vary difficulty include using a washcloth under items like coins or using larger items (checkers vs coins or blocks/dominos vs dice) for increased ease of picking up items.  Timoteo Gaul to work on picking up individual small objects/dice) 1 at a time until pt had 5 in palm to then roll.  Pt had to pick up dice again to try and get up to 5 of the same numbers over 3 rolls.  Pt encouraged to use LUE and did drop a couple of dice a few times.  Shuffling, Flipping and dealing cards 1 at a time. -- Setup patient to work on sorting cards, focusing on using index finger with thumb to flip cards or holding deck of cards in palm of hand and push off 1 card at a time from the top of the deck using only thumb   OT educated pt on table top play of Solitaire for LUE ROM, coordination, visual processing, scanning, and sequencing. Pt required moderate cues for proper play for new game but did well using L hand upon request.     Rotate golf balls (clockwise and counter-clockwise) with forearm pronated and balls on table or supinated and balls in hand.  Pt required extra time to be able to coordinate rotation of balls within her hand with some tactile cues to move balls past each other.    Twirl pen/cil between fingers. - Encouragement to isolate fingers individually and "twirl" (rotation) or flipping and shift up and down the pen (translation) to get it in position for writing or erasing.    Did not get time to review final activity on list ie) tear a piece of paper towel and roll it into small balls with fingertips ie) straw wrapping when eating out.    Patient is encouraged to take breaks, relax arm/shoulder by supporting forearm, minimize compensatory motions and a try different activities throughout the day/week including games like Dorisann Frames (for the dice), card  games, Connect 4 etc.   Patient benefited from extra time, verbal/tactile cues, and modeling of task to allow time for processing of verbal instructions and improve motor planning of unfamiliar movements.   PATIENT EDUCATION: Education details: Psychiatrist Person educated: Patient Education method: Explanation, Demonstration, Tactile cues, Verbal cues, and Handouts Education comprehension: verbalized understanding, returned demonstration, verbal cues required, tactile cues required, and needs further education  HOME EXERCISE PROGRAM:  09/11/23 - Coordination activities with images provided  GOALS: Goals reviewed with patient? Yes  SHORT TERM GOALS: Target date: 09/30/23  Independent with initial HEP for LUE (coordination, ROM) Baseline: Goal status: IN Progress  2.  Pt to verbalize safety considerations for decreased sensation Lt hand Baseline:  Goal status: INITIAL  3.  Pt to attend to Lt side with tabletop and near body scanning and report less drops LUE Baseline:  Goal status: IN Progress  4.  Pt to perform tabletop scanning with no cues required to look far Lt Baseline:  Goal status: IN Progress   LONG TERM GOALS: Target date: 10/30/23  Independent with updated HEP  Baseline:  Goal status: IN Progress  2.  Pt to improve LUE coordination as evidenced by performing 9 hole peg test in 35 sec or less Baseline: 40 sec Goal status: IN Progress  3.  Pt to use Lt hand consistently as assist for bilateral tasks  Baseline:  Goal status: IN Progress  4.  Pt to demo dynamic standing task for 10 min w/o LOB for IADLS  Baseline:  Goal status:  INITIAL  5.  Environmental scanning with 85% or greater accuracy Baseline:  Goal status: INITIAL   ASSESSMENT:  CLINICAL IMPRESSION: Patient is a 68 y.o. female who was seen today for occupational therapy treatment for h/o strokes (2022 and 2024) with residual Lt hemiparesis, decreased sensation, coordination,  attention to Lt side and potential field cut to Lt side. Pt responded well to UE coordination HEP ideas today with guidance to work with R hand initially for understanding of motions but then to increase engagement of LUE. Pt will benefit from further skilled OT services in the outpatient setting to work on UE impairments as noted at eval to help pt return to [facility] as able.   PERFORMANCE DEFICITS: in functional skills including ADLs, IADLs, coordination, dexterity, proprioception, sensation, strength, Fine motor control, Gross motor control, mobility, balance, body mechanics, decreased knowledge of precautions, decreased knowledge of use of DME, vision, and UE functional use, cognitive skills including attention, memory, and safety awareness, and psychosocial skills including coping strategies.   IMPAIRMENTS: are limiting patient from ADLs, IADLs, leisure, and social participation.   CO-MORBIDITIES: may have co-morbidities  that affects occupational performance. Patient will benefit from skilled OT to address above impairments and improve overall function.  REHAB POTENTIAL: Good  PLAN:  OT FREQUENCY: 2x/week  OT DURATION: 8 weeks (may reduce down to 1x/wk if pt unable to get here consistently at 2x/wk)  PLANNED INTERVENTIONS: 97535 self care/ADL training, 06301 therapeutic exercise, 97530 therapeutic activity, 97112 neuromuscular re-education, 97140 manual therapy, 97035 ultrasound, 97039 fluidotherapy, 97010 moist heat, functional mobility training, visual/perceptual remediation/compensation, patient/family education, and DME and/or AE instructions  RECOMMENDED OTHER SERVICES: none at this time  CONSULTED AND AGREED WITH PLAN OF CARE: Patient  PLAN FOR NEXT SESSION:  Review coordination HEP as needed - including final act - rolling tissue in fingertips and further ed re: Golf Solitaire etc to help with FM and visual scanning/attention to Lt side Provided sensory education Putty HEP     Victorino Sparrow, OT 09/11/2023, 10:19 AM

## 2023-09-14 ENCOUNTER — Ambulatory Visit: Admitting: Physical Therapy

## 2023-09-14 ENCOUNTER — Ambulatory Visit: Attending: Nurse Practitioner | Admitting: Occupational Therapy

## 2023-09-14 ENCOUNTER — Ambulatory Visit (INDEPENDENT_AMBULATORY_CARE_PROVIDER_SITE_OTHER): Payer: Medicare HMO

## 2023-09-14 DIAGNOSIS — I639 Cerebral infarction, unspecified: Secondary | ICD-10-CM | POA: Diagnosis not present

## 2023-09-14 LAB — CUP PACEART REMOTE DEVICE CHECK
Date Time Interrogation Session: 20250403083723
Implantable Pulse Generator Implant Date: 20220706
Pulse Gen Model: 436066
Pulse Gen Serial Number: 94069553

## 2023-09-15 NOTE — Addendum Note (Signed)
 Addended by: Geralyn Flash D on: 09/15/2023 02:52 PM   Modules accepted: Orders

## 2023-09-15 NOTE — Progress Notes (Signed)
 Biotronik Loop [employer]

## 2023-09-18 ENCOUNTER — Ambulatory Visit (INDEPENDENT_AMBULATORY_CARE_PROVIDER_SITE_OTHER): Payer: Medicare HMO

## 2023-09-18 DIAGNOSIS — E1165 Type 2 diabetes mellitus with hyperglycemia: Secondary | ICD-10-CM

## 2023-09-18 DIAGNOSIS — M2141 Flat foot [pes planus] (acquired), right foot: Secondary | ICD-10-CM | POA: Diagnosis not present

## 2023-09-18 DIAGNOSIS — M2142 Flat foot [pes planus] (acquired), left foot: Secondary | ICD-10-CM

## 2023-09-18 DIAGNOSIS — L84 Corns and callosities: Secondary | ICD-10-CM | POA: Diagnosis not present

## 2023-09-18 NOTE — Progress Notes (Signed)

## 2023-09-19 ENCOUNTER — Encounter: Payer: Self-pay | Admitting: Occupational Therapy

## 2023-09-19 ENCOUNTER — Ambulatory Visit: Admitting: Occupational Therapy

## 2023-09-19 ENCOUNTER — Telehealth: Payer: Self-pay | Admitting: Physical Therapy

## 2023-09-19 ENCOUNTER — Ambulatory Visit: Attending: Nurse Practitioner | Admitting: Physical Therapy

## 2023-09-19 VITALS — BP 155/93 | HR 68

## 2023-09-19 DIAGNOSIS — M6281 Muscle weakness (generalized): Secondary | ICD-10-CM | POA: Insufficient documentation

## 2023-09-19 DIAGNOSIS — R278 Other lack of coordination: Secondary | ICD-10-CM | POA: Diagnosis present

## 2023-09-19 DIAGNOSIS — R2681 Unsteadiness on feet: Secondary | ICD-10-CM | POA: Diagnosis present

## 2023-09-19 DIAGNOSIS — R471 Dysarthria and anarthria: Secondary | ICD-10-CM | POA: Insufficient documentation

## 2023-09-19 DIAGNOSIS — R41841 Cognitive communication deficit: Secondary | ICD-10-CM | POA: Diagnosis present

## 2023-09-19 DIAGNOSIS — R29818 Other symptoms and signs involving the nervous system: Secondary | ICD-10-CM

## 2023-09-19 DIAGNOSIS — R2689 Other abnormalities of gait and mobility: Secondary | ICD-10-CM | POA: Diagnosis present

## 2023-09-19 DIAGNOSIS — R41842 Visuospatial deficit: Secondary | ICD-10-CM | POA: Diagnosis present

## 2023-09-19 NOTE — Patient Instructions (Signed)
  Safety considerations for loss of sensation:   Look at affected hand when using it!   Do NOT use affected arm for anything: sharp, hot, breakable, or too heavy  Always check temperature of water (for showering, washing dishes, etc) with UNaffected arm/extremity  Consider travel mugs w/ lids to transport hot liquids/coffee  Consider alternative options and/or adaptive equipment to make things safer (ex: hand chopper or cut resistant glove for chopping vegetables)   Avoid cold temperatures as well (wear glove in cold temperatures, get ice w/ unaffected extremity)  AVOID handling chemicals and machinery       1. Grip Strengthening (Resistive Putty)   Squeeze putty using thumb and all fingers. Repeat _20___ times. Do __2__ sessions per day.   2. Roll putty into tube on table and pinch between first two fingers and thumb x 10 reps. Do 2 sessions per day     Copyright  VHI. All rights reserved.

## 2023-09-19 NOTE — Telephone Encounter (Signed)
 Erica Oaks, Erica [name],   Erica [name] is being treated by physical therapy for deficits from her CVA.  Onedia will benefit from use of a Left AFO in order to improve safety with functional mobility.    If you agree, please submit request in EPIC under MD Order, Other Orders (list Left AFO in comments) or fax to [facility] at [phone].   Thank you, Peter Congo, PT, DPT, [facility] [address] [place], [place]  [address] Phone:  [phone] Fax:  [phone]

## 2023-09-19 NOTE — Therapy (Signed)
 OUTPATIENT OCCUPATIONAL THERAPY NEURO TREATMENT  Patient Name: Erica [name] MRN: [id number] DOB:July 30, 1955, 68 y.o., female Today's Date: 09/19/2023  PCP: Estevan Oaks, NP  REFERRING PROVIDER: Estevan Oaks, NP   END OF SESSION:  OT End of Session - 09/19/23 1327     Visit Number 3    Number of Visits 16    Date for OT Re-Evaluation 10/30/23    Authorization Type [employer] MCR primary, MCD secondary    Progress Note Due on Visit 10    OT Start Time 1320    OT Stop Time 1400    OT Time Calculation (min) 40 min    Equipment Utilized During Treatment FM objects    Activity Tolerance Patient tolerated treatment well    Behavior During Therapy WFL for tasks assessed/performed             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   GERD (gastroesophageal reflux disease)    with chronic cough   Headache(784.0)    "mild"   Hypertension    Loop Biotronik loop implant 12/16/2020 12/16/2020   Mental disorder    depression   Neuropathy    feet    Pain    arthritis pain - takes tramadol as needed   Pulmonary fibrosis, unspecified (HCC)    Rectal cancer (HCC)    Rectal polyp    very little bleeding with bowel movements- no pain   Stroke (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   Suicide attempt [facility])    Past Surgical History:  Procedure Laterality Date   ANAL RECTAL MANOMETRY N/A 07/13/2016   Procedure: ANO RECTAL MANOMETRY;  Surgeon: Romie Levee, MD;  Location: [facility];  Service: Endoscopy;  Laterality: N/A;   CESAREAN SECTION     EUS N/A 11/21/2012   Procedure: LOWER ENDOSCOPIC ULTRASOUND (EUS);  Surgeon: Willis Modena, MD;  Location: [facility];  Service: Endoscopy;  Laterality: N/A;   FLEXIBLE SIGMOIDOSCOPY N/A 11/21/2012   Procedure: FLEXIBLE SIGMOIDOSCOPY;  Surgeon: Willis Modena, MD;  Location: [facility];  Service: Endoscopy;  Laterality: N/A;   FLEXIBLE SIGMOIDOSCOPY N/A 01/28/2013   Procedure: FLEXIBLE SIGMOIDOSCOPY;  Surgeon: Romie Levee, MD;  Location: [facility];  Service: Endoscopy;  Laterality: N/A;   LAPAROSCOPIC LOW ANTERIOR RESECTION N/A 01/29/2013   Procedure: LAPAROSCOPIC LOW ANTERIOR RESECTION, Rigid Proctoscopy;  Surgeon: Romie Levee, MD;  Location: [facility];  Service: General;  Laterality: N/A;   LAPAROSCOPIC SIGMOID COLECTOMY N/A 11/14/2012   Procedure: DIAGNOSTIC LAPAROSCOPY AND SIGMOIDMOIDOSCOPY ;  Surgeon: Emelia Loron, MD;  Location: [facility];  Service: General;  Laterality: N/A;   RECTAL ULTRASOUND N/A 07/13/2016   Procedure: RECTAL ULTRASOUND;  Surgeon: Romie Levee, MD;  Location: [facility];  Service: Endoscopy;  Laterality: N/A;   TONSILLECTOMY     TONSILLECTOMY AND ADENOIDECTOMY     Patient Active Problem List   Diagnosis Date Noted   Syncope 03/18/2023   Stroke (cerebrum) (HCC) 03/08/2023   Acute ischemic stroke (HCC) 03/05/2023   Stroke-like symptom 03/04/2023   Research study patient 07/15/2022   Encounter for loop recorder check 01/02/2021   Loop Biotronik loop implant 12/16/2020 12/16/2020   Leukopenia    Controlled type 2 diabetes mellitus with hyperglycemia, with long-term current use of insulin (HCC)    Vascular headache    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   Sinus bradycardia on ECG 11/03/2020   Benign essential HTN    Pancytopenia (HCC)    Stage 3b chronic kidney disease (HCC)    Anemia    Labile blood glucose    Labile blood pressure    Diabetic peripheral neuropathy (HCC)    Essential hypertension    AKI (acute kidney injury) (HCC)    Ataxia, late effect of cerebrovascular disease    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   Acute CVA (cerebrovascular accident) (HCC) 06/26/2020   CVA (cerebral vascular accident) (HCC) 06/25/2020   BMI 32.0-32.9,adult 06/03/2020   Left upper lobe  pneumonia 03/30/2020   Chest pain 10/01/2019   MDD (major depressive disorder), recurrent severe, 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   Suicide attempt (HCC) 05/09/2019   Cocaine abuse (HCC) 05/09/2019   Interstitial lung disease (HCC) 06/21/2016   COPD (chronic obstructive pulmonary disease) (HCC) 06/21/2016   Cough 06/21/2016   GERD (gastroesophageal reflux disease) 06/21/2016   Hypersomnia 06/21/2016   Tobacco user 06/21/2016   Rectal cancer (HCC) 01/30/2013    ONSET DATE: 07/04/23 (referral date)  REFERRING DIAG:  Z86.73 (ICD-10-CM) - Personal history of transient cerebral ischemia  I69.354 (ICD-10-CM) - Hemiparesis affecting left side as late effect of cerebrovascular accident (HCC)  M15.9 (ICD-10-CM) - Generalized osteoarthrosis, involving multiple sites    THERAPY DIAG:  Other symptoms and signs involving the nervous system  Muscle weakness (generalized)  Visuospatial deficit  Other lack of coordination  Unsteadiness on feet  Rationale for Evaluation and Treatment: Rehabilitation  SUBJECTIVE:   SUBJECTIVE STATEMENT: No new falls, no pain.  I'm tired today  Pt accompanied by: self  PERTINENT HISTORY: arthritis especially in shoulders, asthma, COPD, depression, DM, hx of multiple strokes, GERD, HTN, loop recorder implant 12/16/20, neuropathy, hx of rectal cancer, pulmonary fibrosis, suicide attempt  Per 06/21/23 NP Progress Notes: history of recurrent ischemic stroke.  She had 2 strokes in 2022 (January and May), again in September 2024 and then October 2024.  She is currently off Plavix and only on aspirin 81 mg PO daily.  She had a LOOP recorder placed in July 2022... She quit smoking 3 years ago after her first stroke... She has remote history of rectal cancer stage I (T1, N0, cM0) followed by Dr. Mosetta Putt... She has mild weakness on the left side including foot drop. Tingling noted in the left side as well post stroke.  Tingling in hands with cold    PRECAUTIONS:  Fall and Other: loop implant, no driving    WEIGHT BEARING RESTRICTIONS: No  PAIN:  Are you having pain?  No but wears glove Lt hand d/t feeling "cold"   FALLS: Has patient fallen in last 6 months? No  LIVING ENVIRONMENT: Lives with: lives alone (24 hr care from family) but looking into an Assisted Living Facility Lives in: apartment on first  floor, washer/dryer in apt Has following equipment at home: Single point cane, Environmental consultant - 2 wheeled, Wheelchair (manual), shower chair, and bed side commode  PLOF: Independent with basic ADLs prior to December  PATIENT GOALS: speech primary goal, balance and Lt hand/UE secondary  OBJECTIVE:  Note: Objective measures were completed at Evaluation unless otherwise noted.  HAND DOMINANCE: Right  ADLs: Overall ADLs: overall mod I to supervision for BADLs Transfers/ambulation related to ADLs: walks w/ cane in house Eating: difficulty cutting food Grooming: mod I  UB Dressing: mod I  LB Dressing: mod I  Toileting: mod I  Bathing: usually has supervision and occasional assist to wash back, but not always - therapist recommended ALWAYS having someone there when she is showering for safety reasons Tub Shower transfers: close supervision Equipment: Shower seat with back and Grab bars  IADLs: Shopping: pt uses scooter, sister drives pt to store Light housekeeping: does light cleaning (swifer, washes dishes, laundry) - pt's sister does vacuuming/heavier cleaning Meal Prep: pt orders out a lot, does microwave, cold prep meal Community mobility: relies on sister or [employer], public transportation Medication management: pt's sister places meds in pillbox each week Financial management: son takes care of bills Handwriting: 100% legible and print (some expressive aphasia)  MOBILITY STATUS:  uses cane in apt, walker more when out   UPPER EXTREMITY ROM:  RUE WNL's, LUE WFL's but does have to focus/attend to Lt side d/t decreased sensation and attention to  Lt side.   UPPER EXTREMITY MMT:   not tested d/t previous Lt shoulder issues   HAND FUNCTION: Grip strength: Right: 38.1 lbs; Left: 35.4 lbs  COORDINATION: 9 Hole Peg test: Right: 26.64 sec; Left: 40.29 sec  SENSATION: Light touch: WFL 2 pt discrimination impaired  Lt hand w/ Rt hand simultaneous stimulation Lt hand feels cold, numbness/tingling Reports dropping things from Lt hand  EDEMA: none in hands   COGNITION: Overall cognitive status:  word finding difficulties (mild expressive and ? Some receptive aphasia vs. Slower processing info), pt also reports memory deficits and reports being diagnosed with dementia  VISION: Subjective report: I wear glasses but I lost them. My Lt eye doesn't work as well Baseline vision: Wears glasses all the time (but lost them) Visual history:  none reported  VISION ASSESSMENT: To be further assessed in functional context ? Lt peripheral loss   PERCEPTION: Not tested  PRAXIS: Not tested  OBSERVATIONS: reports dropping things from Lt hand, wears copper glove Lt hand d/t feeling "cold"                                                                                                                            TREATMENT DATE: 09/19/23  Reviewed coordination HEP (see previous note)  Pt issued safety considerations for loss of sensation and reviewed/discussed. Pt also issued handouts on potential A/E needs for safety including: cut resistant glove, finger guard, veggie chopper, oven rack pull. Pt already has silicone oven  mitts.   Pt issued putty HEP for grip and pinch strength and sensory/tactile feedback - see pt instructions for details. Pt issued red resistance putty  PATIENT EDUCATION: Education details: Psychiatrist Person educated: Patient Education method: Explanation, Demonstration, Tactile cues, Verbal cues, and Handouts Education comprehension: verbalized understanding, returned demonstration, verbal cues required,  tactile cues required, and needs further education  HOME EXERCISE PROGRAM:  09/11/23 - Coordination activities with images provided  GOALS: Goals reviewed with patient? Yes  SHORT TERM GOALS: Target date: 09/30/23  Independent with initial HEP for LUE (coordination, ROM) Baseline: Goal status: IN Progress  2.  Pt to verbalize safety considerations for decreased sensation Lt hand Baseline:  Goal status: INITIAL  3.  Pt to attend to Lt side with tabletop and near body scanning and report less drops LUE Baseline:  Goal status: IN Progress  4.  Pt to perform tabletop scanning with no cues required to look far Lt Baseline:  Goal status: IN Progress   LONG TERM GOALS: Target date: 10/30/23  Independent with updated HEP  Baseline:  Goal status: IN Progress  2.  Pt to improve LUE coordination as evidenced by performing 9 hole peg test in 35 sec or less Baseline: 40 sec Goal status: IN Progress  3.  Pt to use Lt hand consistently as assist for bilateral tasks  Baseline:  Goal status: IN Progress  4.  Pt to demo dynamic standing task for 10 min w/o LOB for IADLS  Baseline:  Goal status: INITIAL  5.  Environmental scanning with 85% or greater accuracy Baseline:  Goal status: INITIAL   ASSESSMENT:  CLINICAL IMPRESSION: Patient is a 68 y.o. female who was seen today for occupational therapy treatment for h/o strokes (2022 and 2024) with residual Lt hemiparesis, decreased sensation, coordination, attention to Lt side and potential field cut to Lt side. Pt responded well to UE coordination HEP ideas today with guidance to work with R hand initially for understanding of motions but then to increase engagement of LUE. Pt will benefit from further skilled OT services in the outpatient setting to work on UE impairments as noted at eval to help pt return to [facility] as able.   PERFORMANCE DEFICITS: in functional skills including ADLs, IADLs, coordination, dexterity, proprioception,  sensation, strength, Fine motor control, Gross motor control, mobility, balance, body mechanics, decreased knowledge of precautions, decreased knowledge of use of DME, vision, and UE functional use, cognitive skills including attention, memory, and safety awareness, and psychosocial skills including coping strategies.   IMPAIRMENTS: are limiting patient from ADLs, IADLs, leisure, and social participation.   CO-MORBIDITIES: may have co-morbidities  that affects occupational performance. Patient will benefit from skilled OT to address above impairments and improve overall function.  REHAB POTENTIAL: Good  PLAN:  OT FREQUENCY: 2x/week  OT DURATION: 8 weeks (may reduce down to 1x/wk if pt unable to get here consistently at 2x/wk)  PLANNED INTERVENTIONS: 97535 self care/ADL training, 16109 therapeutic exercise, 97530 therapeutic activity, 97112 neuromuscular re-education, 97140 manual therapy, 97035 ultrasound, 97039 fluidotherapy, 97010 moist heat, functional mobility training, visual/perceptual remediation/compensation, patient/family education, and DME and/or AE instructions  RECOMMENDED OTHER SERVICES: none at this time  CONSULTED AND AGREED WITH PLAN OF CARE: Patient  PLAN FOR NEXT SESSION:  [employer] etc to help with FM and visual scanning/attention to Lt side, also practice carrying items in Lt hand while ambulating with cane in Rt hand   Sheran Lawless, OT 09/19/2023, 1:27 PM

## 2023-09-19 NOTE — Therapy (Signed)
 OUTPATIENT PHYSICAL THERAPY NEURO TREATMENT   Patient Name: GRACIE GUPTA MRN: [id number] DOB:05-Dec-1955, 68 y.o., female Today's Date: 09/19/2023   PCP: Estevan Oaks, NP REFERRING PROVIDER: Estevan Oaks, NP  END OF SESSION:  PT End of Session - 09/19/23 1402     Visit Number 3    Number of Visits 13   with eval   Date for PT Re-Evaluation 10/25/23   to allow for scheduling delays   Authorization Type [employer]    Authorization Time Period Approved 13 PT visits 08/30/23 - 10/25/23    PT Start Time 1400    PT Stop Time 1441    PT Time Calculation (min) 41 min    Equipment Utilized During Treatment Gait belt    Activity Tolerance Patient tolerated treatment well    Behavior During Therapy WFL for tasks assessed/performed              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   GERD (gastroesophageal reflux disease)    with chronic cough   Headache(784.0)    "mild"   Hypertension    Loop Biotronik loop implant 12/16/2020 12/16/2020   Mental disorder    depression   Neuropathy    feet    Pain    arthritis pain - takes tramadol as needed   Pulmonary fibrosis, unspecified (HCC)    Rectal cancer (HCC)    Rectal polyp    very little bleeding with bowel movements- no pain   Stroke (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   Suicide attempt [facility])    Past Surgical History:  Procedure Laterality Date   ANAL RECTAL MANOMETRY N/A 07/13/2016   Procedure: ANO RECTAL MANOMETRY;  Surgeon: Romie Levee, MD;  Location: [facility] END[facility];  Service: Endoscopy;  Laterality: N/A;   CESAREAN SECTION     EUS N/A 11/21/2012   Procedure: LOWER ENDOSCOPIC ULTRASOUND (EUS);  Surgeon: Willis Modena, MD;  Location: [facility];  Service: Endoscopy;  Laterality: N/A;   FLEXIBLE SIGMOIDOSCOPY N/A 11/21/2012   Procedure: FLEXIBLE  SIGMOIDOSCOPY;  Surgeon: Willis Modena, MD;  Location: [facility];  Service: Endoscopy;  Laterality: N/A;   FLEXIBLE SIGMOIDOSCOPY N/A 01/28/2013   Procedure: FLEXIBLE SIGMOIDOSCOPY;  Surgeon: Romie Levee, MD;  Location: [facility];  Service: Endoscopy;  Laterality: N/A;   LAPAROSCOPIC LOW ANTERIOR RESECTION N/A 01/29/2013   Procedure: LAPAROSCOPIC LOW ANTERIOR RESECTION, Rigid Proctoscopy;  Surgeon: Romie Levee, MD;  Location: [facility];  Service: General;  Laterality: N/A;   LAPAROSCOPIC SIGMOID COLECTOMY N/A 11/14/2012   Procedure: DIAGNOSTIC LAPAROSCOPY AND SIGMOIDMOIDOSCOPY ;  Surgeon: Emelia Loron, MD;  Location: [facility];  Service: General;  Laterality: N/A;   RECTAL ULTRASOUND N/A 07/13/2016   Procedure: RECTAL ULTRASOUND;  Surgeon: Romie Levee, MD;  Location: [facility];  Service: Endoscopy;  Laterality: N/A;   TONSILLECTOMY     TONSILLECTOMY AND ADENOIDECTOMY     Patient Active Problem List   Diagnosis Date Noted   Syncope 03/18/2023   Stroke (cerebrum) (HCC) 03/08/2023   Acute ischemic stroke (HCC) 03/05/2023   Stroke-like symptom 03/04/2023   Research study patient 07/15/2022   Encounter for loop recorder check 01/02/2021   Loop Biotronik loop implant 12/16/2020 12/16/2020   Leukopenia    Controlled type 2 diabetes mellitus with hyperglycemia, with long-term current use of insulin (  HCC)    Vascular headache    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   Sinus bradycardia on ECG 11/03/2020   Benign essential HTN    Pancytopenia (HCC)    Stage 3b chronic kidney disease (HCC)    Anemia    Labile blood glucose    Labile blood pressure    Diabetic peripheral neuropathy (HCC)    Essential hypertension    AKI (acute kidney injury) (HCC)    Ataxia, late effect of cerebrovascular disease    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   Acute CVA (cerebrovascular accident) (HCC) 06/26/2020   CVA (cerebral vascular accident) (HCC)  06/25/2020   BMI 32.0-32.9,adult 06/03/2020   Left upper lobe pneumonia 03/30/2020   Chest pain 10/01/2019   MDD (major depressive disorder), recurrent severe, 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   Suicide attempt (HCC) 05/09/2019   Cocaine abuse (HCC) 05/09/2019   Interstitial lung disease (HCC) 06/21/2016   COPD (chronic obstructive pulmonary disease) (HCC) 06/21/2016   Cough 06/21/2016   GERD (gastroesophageal reflux disease) 06/21/2016   Hypersomnia 06/21/2016   Tobacco user 06/21/2016   Rectal cancer (HCC) 01/30/2013    ONSET DATE: 07/04/2023 (referral date)  REFERRING DIAG: Z86.73 (ICD-10-CM) - Personal history of transient ischemic attack I69.354 (ICD-10-CM) - Hemiparesis affecting left side as late effect of cerebrovascular accident (HCC) M15.9 (ICD-10-CM) - Generalized osteoarthrosis, involving multiple sites  THERAPY DIAG:  Muscle weakness (generalized)  Other symptoms and signs involving the nervous system  Other abnormalities of gait and mobility  Unsteadiness on feet  Rationale for Evaluation and Treatment: Rehabilitation  SUBJECTIVE:                                                                                                                                                                                             SUBJECTIVE STATEMENT: No changes since she was last here, no falls. Pt feeling tired today, got to therapy clinic early today so had a lot of time to wait around.  Pt accompanied by: self, took [facility] [place] to her appointment today  PERTINENT HISTORY: arthritis, asthma, depression, anxiety, GERD, HTN, neuropathy, suicide attempt, DM, CVA with residual L side deficits, rectal cancer, COPD/emphysema, pulmonary fibrosis with baseline dyspnea and chronic cough  Per Ihor Austin note 05/16/23: NEVIN KOZUCH is a 68 y.o. year old female with recent acute to subacute bilateral centrum semiovale infarcts 02/12/2023 and left thalamic infarct on 03/18/2023 likely  secondary to small vessel disease.  History of recurrent right MCA strokes on 11/03/2020 and 06/25/2020 likely in setting of small vessel disease although unable to rule out cardioembolic  source with residual left hemiparesis and sensory impairment.  Loop recorder placed 12/2020 by Dr. Odis Hollingshead [facility] cardiology).  Vascular risk factors include HTN, HLD, DM, CAD, history of tobacco use and history of polysubstance abuse and multiple prior strokes noted on imaging.   PAIN:  Are you having pain? No  PRECAUTIONS: Fall and Other: loop recorder  RED FLAGS: None   WEIGHT BEARING RESTRICTIONS: No  FALLS: Has patient fallen in last 6 months? No  LIVING ENVIRONMENT: Lives with: lives alone, sister and son come check on her Lives in: House/apartment Stairs: No Has following equipment at home: Single point cane, Environmental consultant - 2 wheeled, and Wheelchair (manual)  PLOF: Independent with gait, Independent with transfers, and Requires assistive device for independence  PATIENT GOALS: "to learn to walk faster so I can start walking around the corner"  OBJECTIVE:  Note: Objective measures were completed at Evaluation unless otherwise noted.  DIAGNOSTIC FINDINGS:  Brain MRI 03/18/2023 IMPRESSION: 1. Acute punctate nonhemorrhagic infarct of the left thalamus. 2. Previously noted subcortical infarcts of the centrum semi ovale demonstrate expected change in are only faintly seen on the fusion weighted images without definite restricted diffusion. 3. 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 4. Remote watershed territory infarcts and diffuse subcortical white matter disease is also stable. 5. Remote lacunar infarcts of the basal ganglia and cerebellum bilaterally are stable.  COGNITION: Overall cognitive status: Within functional limits for tasks assessed   SENSATION: N/T in L hand  Light touch decreased on LLE as compared to RLE  COORDINATION: decreased in LLE due to  strength impairments  EDEMA:  Occasional edema if she doesn't wear her support hose  POSTURE: rounded shoulders, forward head, and posterior pelvic tilt  LOWER EXTREMITY ROM:     Active  Right Eval Left Eval  Hip flexion    Hip extension    Hip abduction    Hip adduction    Hip internal rotation    Hip external rotation    Knee flexion    Knee extension    Ankle dorsiflexion  decreased  Ankle plantarflexion    Ankle inversion    Ankle eversion     (Blank rows = not tested)  LOWER EXTREMITY MMT:    MMT Right Eval Left Eval  Hip flexion 5 4  Hip extension    Hip abduction    Hip adduction    Hip internal rotation    Hip external rotation    Knee flexion 5 4  Knee extension 5 4  Ankle dorsiflexion 4 2-  Ankle plantarflexion    Ankle inversion    Ankle eversion    (Blank rows = not tested)  BED MOBILITY:  Mod I per patient report; uses step stool as her bed is high  TRANSFERS: Assistive device utilized: Single point cane  Sit to stand: Modified independence Stand to sit: Modified independence Chair to chair: Modified independence Floor:  not assessed at eval  GAIT: Gait pattern: decreased step length- Right, decreased step length- Left, decreased stride length, decreased hip/knee flexion- Right, decreased hip/knee flexion- Left, decreased ankle dorsiflexion- Right, decreased ankle dorsiflexion- Left, shuffling, trunk flexed, and narrow BOS Distance walked: various clinic distances Assistive device utilized: Single point cane Level of assistance: Modified independence  TREATMENT:    Self-Care/Home Management: Vitals:   09/19/23 1406  BP: (!) 155/93  Pulse: 68   Assessed in RUE in sitting prior to intervention. BP elevated but still [facility] for safe participation in PT session.  Gait Gait pattern: step to pattern, step through  pattern, and decreased hip/knee flexion- Left Distance walked: 115 ft Assistive device utilized: Single point cane Level of assistance: SBA Comments: with trial use of [place] L posterior medial strut AFO, improved ankle DF but due to brace being flexible/less rigid causes some spasticity of LLE and decreased control of limb noted; initially with step-to gait pattern progressing to step-through pattern   Gait pattern: step to pattern and decreased hip/knee flexion- Left Distance walked: 115 ft Assistive device utilized: Single point cane Level of assistance: SBA Comments: with trial use of [place] L PLS AFO, improved ankle DF with gait and limb clearance as compared to foot-up brace  Pt able to don/doff brace with min A for first trial of this.  While wearing L Thusane PLS AFO:  RAMP:  Level of Assistance: Min A Assistive device utilized: Single point cane Ramp Comments: increased assist needed descending ramp as compared to ascending ramp  CURB:  Level of Assistance: Mod A Assistive device utilized: Single point cane Curb Comments: mod A to descend 6" curb, more difficulty descending curb vs ascending curb  STAIRS:  Level of Assistance: CGA  Stair Negotiation Technique: Step to Pattern Alternating Pattern  with Bilateral Rails  Number of Stairs: 4 x 2   Height of Stairs: 6"  Comments: step to gait pattern ascending and descending during 1st set, alternating gait pattern for 2nd set    PATIENT EDUCATION: Education details: trial of AFOs with recommendation of L Thusane PLS - will reach out to PCP for order Person educated: Patient Education method: Medical illustrator Education comprehension: verbalized understanding, returned demonstration, and needs further education  HOME EXERCISE PROGRAM: Access Code: [email or web address] URL: [email or web address] Date: 09/11/2023 Prepared by: Sherlie Ban  Exercises - Staggered Sit-to-Stand (Mirrored)  - 1-2 x  daily - 5 x weekly - 2 sets - 10 reps - Standing March with Counter Support  - 1-2 x daily - 5 x weekly - 2 sets - 10 reps - Heel Toe Raises with Counter Support  - 1-2 x daily - 5 x weekly - 2 sets - 10 reps  GOALS: Goals reviewed with patient? Yes  SHORT TERM GOALS: Target date: 09/27/2023  Pt will be independent with initial HEP for improved strength, balance, transfers and gait. Baseline: Goal status: INITIAL  2.  Pt will improve 5 x STS to less than or equal to 20 seconds to demonstrate improved functional strength and transfer efficiency.  Baseline: 24.22 sec no UE (3/19) Goal status: INITIAL  3.  Pt will improve gait velocity to at least 2.0 ft/sec for improved gait efficiency and performance at mod I level  Baseline: 1.86 ft/sec with SPC (3/19) Goal status: INITIAL  4.  Pt will improve normal TUG to less than or equal to 24 seconds for improved functional mobility and decreased fall risk. Baseline: 27 sec with SPC (3/19) Goal status: INITIAL  5.  Pt will improve Berg score to 40/56 for decreased fall risk Baseline: 35/56 (3/19) Goal status: INITIAL  6.  to be assessed and STG set Baseline: 450' with SPC  Goal status: MET  LONG TERM GOALS: Target date: 10/25/2023   Pt will be independent with final HEP for improved strength, balance, transfers  and gait. Baseline:  Goal status: INITIAL  2.  Pt will improve 5 x STS to less than or equal to 15 seconds to demonstrate improved functional strength and transfer efficiency.  Baseline: 24.22 sec no UE (3/19) Goal status: INITIAL  3.  Pt will improve gait velocity to at least 2.25 ft/sec for improved gait efficiency and performance at mod I level  Baseline: 1.86 ft/sec with SPC (3/19) Goal status: INITIAL  4.  Pt will improve normal TUG to less than or equal to 21 seconds for improved functional mobility and decreased fall risk. Baseline: 27 sec with SPC (3/19) Goal status: INITIAL  5.  Pt will improve Berg score  to 45/56 for decreased fall risk Baseline: 35/56 (3/19) Goal status: INITIAL  6.  Pt will ambulate at least 525' with with SPC vs. RW with no seated rest break and reporting RPE as 5/10 or less in order to demo improved gait endurance.  Baseline: 450' with SPC and CGA Goal status: INITIAL  ASSESSMENT:  CLINICAL IMPRESSION: Emphasis of skilled PT session on trialing various AFOs with LLE to determine most appropriate brace for patient at this time. She currently has a L foot-up brace but does not consistently attach bottom piece to her shoes, not wearing attachment piece this session. Pt exhibits most benefit from use of L [place]ane PLS AFO this session both with gait across level surface along with gait up/down ramp, up/down curb, and up/down 6" stairs. Pt exhibits improved L ankle DF and limb clearance with use of this brace this session. Pt does need more physical assistance when descending 6" curb this date due to ongoing LLE weakness. Pt continues to benefit from skilled PT services to work towards increased safety and independence with functional mobility. Continue POC.    OBJECTIVE IMPAIRMENTS: Abnormal gait, cardiopulmonary status limiting activity, decreased activity tolerance, decreased balance, decreased endurance, decreased mobility, difficulty walking, decreased ROM, and decreased strength.   ACTIVITY LIMITATIONS: carrying, lifting, bending, squatting, and stairs  PARTICIPATION LIMITATIONS: meal prep, cleaning, laundry, and driving  PERSONAL FACTORS: Time since onset of injury/illness/exacerbation and 3+ comorbidities:    arthritis, asthma, depression, anxiety, GERD, HTN, neuropathy, suicide attempt, DM, CVA with residual L side deficits, rectal cancer, COPD/emphysema, pulmonary fibrosis with baseline dyspnea and chronic coughare also affecting patient's functional outcome.   REHAB POTENTIAL: Fair time since onset, history of multiple CVAs  CLINICAL DECISION MAKING:  Evolving/moderate complexity  EVALUATION COMPLEXITY: Moderate  PLAN:  PT FREQUENCY: 2x/week  PT DURATION: 6 weeks  PLANNED INTERVENTIONS: 97164- PT Re-evaluation, 97110-Therapeutic exercises, 97530- Therapeutic activity, 97112- Neuromuscular re-education, 97535- Self Care, 13086- Manual therapy, [id number]- Gait training, [phone]- Orthotic Fit/training, [phone]- Electrical stimulation (manual), Patient/Family education, Balance training, Stair training, Taping, Dry Needling, DME instructions, Cryotherapy, and Moist heat  PLAN FOR NEXT SESSION: cancel some PT visits at end of her schedule because she got scheduled for too many and past her cert date,   assess BP, add to HEP HEP to address balance, strength, and endurance (step taps, step ups, lateral stepping, airex, SciFit); LLE NMR; did we get AFO order for L AFO?   Peter Congo, PT, DPT, CSRS 09/19/23 2:41 PM

## 2023-09-21 ENCOUNTER — Encounter: Payer: Self-pay | Admitting: Occupational Therapy

## 2023-09-21 ENCOUNTER — Ambulatory Visit: Admitting: Physical Therapy

## 2023-09-21 ENCOUNTER — Ambulatory Visit: Admitting: Occupational Therapy

## 2023-09-21 VITALS — BP 123/80 | HR 72

## 2023-09-21 DIAGNOSIS — R2689 Other abnormalities of gait and mobility: Secondary | ICD-10-CM

## 2023-09-21 DIAGNOSIS — R29818 Other symptoms and signs involving the nervous system: Secondary | ICD-10-CM

## 2023-09-21 DIAGNOSIS — R2681 Unsteadiness on feet: Secondary | ICD-10-CM

## 2023-09-21 DIAGNOSIS — M6281 Muscle weakness (generalized): Secondary | ICD-10-CM

## 2023-09-21 DIAGNOSIS — R41842 Visuospatial deficit: Secondary | ICD-10-CM

## 2023-09-21 DIAGNOSIS — R278 Other lack of coordination: Secondary | ICD-10-CM

## 2023-09-21 NOTE — Therapy (Signed)
 OUTPATIENT PHYSICAL THERAPY NEURO TREATMENT   Patient Name: YULIET NEEDS MRN: [id number] DOB:05-16-1956, 68 y.o., female Today's Date: 09/21/2023   PCP: Estevan Oaks, NP REFERRING PROVIDER: Estevan Oaks, NP  END OF SESSION:  PT End of Session - 09/21/23 0934     Visit Number 4    Number of Visits 13   with eval   Date for PT Re-Evaluation 10/25/23   to allow for scheduling delays   Authorization Type [employer]    Authorization Time Period Approved 13 PT visits 08/30/23 - 10/25/23    PT Start Time 0930    PT Stop Time 1015    PT Time Calculation (min) 45 min    Equipment Utilized During Treatment Gait belt    Activity Tolerance Patient tolerated treatment well    Behavior During Therapy WFL for tasks assessed/performed               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   GERD (gastroesophageal reflux disease)    with chronic cough   Headache(784.0)    "mild"   Hypertension    Loop Biotronik loop implant 12/16/2020 12/16/2020   Mental disorder    depression   Neuropathy    feet    Pain    arthritis pain - takes tramadol as needed   Pulmonary fibrosis, unspecified (HCC)    Rectal cancer (HCC)    Rectal polyp    very little bleeding with bowel movements- no pain   Stroke (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   Suicide attempt [facility])    Past Surgical History:  Procedure Laterality Date   ANAL RECTAL MANOMETRY N/A 07/13/2016   Procedure: ANO RECTAL MANOMETRY;  Surgeon: Romie Levee, MD;  Location: [facility];  Service: Endoscopy;  Laterality: N/A;   CESAREAN SECTION     EUS N/A 11/21/2012   Procedure: LOWER ENDOSCOPIC ULTRASOUND (EUS);  Surgeon: Willis Modena, MD;  Location: [facility];  Service: Endoscopy;  Laterality: N/A;   FLEXIBLE SIGMOIDOSCOPY N/A 11/21/2012   Procedure: FLEXIBLE  SIGMOIDOSCOPY;  Surgeon: Willis Modena, MD;  Location: [facility];  Service: Endoscopy;  Laterality: N/A;   FLEXIBLE SIGMOIDOSCOPY N/A 01/28/2013   Procedure: FLEXIBLE SIGMOIDOSCOPY;  Surgeon: Romie Levee, MD;  Location: [facility];  Service: Endoscopy;  Laterality: N/A;   LAPAROSCOPIC LOW ANTERIOR RESECTION N/A 01/29/2013   Procedure: LAPAROSCOPIC LOW ANTERIOR RESECTION, Rigid Proctoscopy;  Surgeon: Romie Levee, MD;  Location: [facility];  Service: General;  Laterality: N/A;   LAPAROSCOPIC SIGMOID COLECTOMY N/A 11/14/2012   Procedure: DIAGNOSTIC LAPAROSCOPY AND SIGMOIDMOIDOSCOPY ;  Surgeon: Emelia Loron, MD;  Location: [facility];  Service: General;  Laterality: N/A;   RECTAL ULTRASOUND N/A 07/13/2016   Procedure: RECTAL ULTRASOUND;  Surgeon: Romie Levee, MD;  Location: [facility];  Service: Endoscopy;  Laterality: N/A;   TONSILLECTOMY     TONSILLECTOMY AND ADENOIDECTOMY     Patient Active Problem List   Diagnosis Date Noted   Syncope 03/18/2023   Stroke (cerebrum) (HCC) 03/08/2023   Acute ischemic stroke (HCC) 03/05/2023   Stroke-like symptom 03/04/2023   Research study patient 07/15/2022   Encounter for loop recorder check 01/02/2021   Loop Biotronik loop implant 12/16/2020 12/16/2020   Leukopenia    Controlled type 2 diabetes mellitus with hyperglycemia, with long-term current use of  insulin (HCC)    Vascular headache    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   Sinus bradycardia on ECG 11/03/2020   Benign essential HTN    Pancytopenia (HCC)    Stage 3b chronic kidney disease (HCC)    Anemia    Labile blood glucose    Labile blood pressure    Diabetic peripheral neuropathy (HCC)    Essential hypertension    AKI (acute kidney injury) (HCC)    Ataxia, late effect of cerebrovascular disease    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   Acute CVA (cerebrovascular accident) (HCC) 06/26/2020   CVA (cerebral vascular accident) (HCC)  06/25/2020   BMI 32.0-32.9,adult 06/03/2020   Left upper lobe pneumonia 03/30/2020   Chest pain 10/01/2019   MDD (major depressive disorder), recurrent severe, 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   Suicide attempt (HCC) 05/09/2019   Cocaine abuse (HCC) 05/09/2019   Interstitial lung disease (HCC) 06/21/2016   COPD (chronic obstructive pulmonary disease) (HCC) 06/21/2016   Cough 06/21/2016   GERD (gastroesophageal reflux disease) 06/21/2016   Hypersomnia 06/21/2016   Tobacco user 06/21/2016   Rectal cancer (HCC) 01/30/2013    ONSET DATE: 07/04/2023 (referral date)  REFERRING DIAG: Z86.73 (ICD-10-CM) - Personal history of transient ischemic attack I69.354 (ICD-10-CM) - Hemiparesis affecting left side as late effect of cerebrovascular accident (HCC) M15.9 (ICD-10-CM) - Generalized osteoarthrosis, involving multiple sites  THERAPY DIAG:  Muscle weakness (generalized)  Unsteadiness on feet  Other abnormalities of gait and mobility  Other symptoms and signs involving the nervous system  Rationale for Evaluation and Treatment: Rehabilitation  SUBJECTIVE:                                                                                                                                                                                             SUBJECTIVE STATEMENT: No changes since she was last here, no falls. Pt reports she has been working on her HEP.  Pt accompanied by: self, took [facility] [place] to her appointment today  PERTINENT HISTORY: arthritis, asthma, depression, anxiety, GERD, HTN, neuropathy, suicide attempt, DM, CVA with residual L side deficits, rectal cancer, COPD/emphysema, pulmonary fibrosis with baseline dyspnea and chronic cough  Per Ihor Austin note 05/16/23: HESTER JOSLIN is a 68 y.o. year old female with recent acute to subacute bilateral centrum semiovale infarcts 02/12/2023 and left thalamic infarct on 03/18/2023 likely secondary to small vessel disease.  History of  recurrent right MCA strokes on 11/03/2020 and 06/25/2020 likely in setting of small vessel disease although unable to rule out cardioembolic source with residual left hemiparesis and sensory impairment.  Loop recorder placed 12/2020 by Dr. Odis Hollingshead [facility] cardiology).  Vascular risk factors include HTN, HLD, DM, CAD, history of tobacco use and history of polysubstance abuse and multiple prior strokes noted on imaging.   PAIN:  Are you having pain? No  PRECAUTIONS: Fall and Other: loop recorder  RED FLAGS: None   WEIGHT BEARING RESTRICTIONS: No  FALLS: Has patient fallen in last 6 months? No  LIVING ENVIRONMENT: Lives with: lives alone, sister and son come check on her Lives in: House/apartment Stairs: No Has following equipment at home: Single point cane, Environmental consultant - 2 wheeled, and Wheelchair (manual)  PLOF: Independent with gait, Independent with transfers, and Requires assistive device for independence  PATIENT GOALS: "to learn to walk faster so I can start walking around the corner"  OBJECTIVE:  Note: Objective measures were completed at Evaluation unless otherwise noted.  DIAGNOSTIC FINDINGS:  Brain MRI 03/18/2023 IMPRESSION: 1. Acute punctate nonhemorrhagic infarct of the left thalamus. 2. Previously noted subcortical infarcts of the centrum semi ovale demonstrate expected change in are only faintly seen on the fusion weighted images without definite restricted diffusion. 3. 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 4. Remote watershed territory infarcts and diffuse subcortical white matter disease is also stable. 5. Remote lacunar infarcts of the basal ganglia and cerebellum bilaterally are stable.  COGNITION: Overall cognitive status: Within functional limits for tasks assessed   SENSATION: N/T in L hand  Light touch decreased on LLE as compared to RLE  COORDINATION: decreased in LLE due to strength impairments  EDEMA:  Occasional  edema if she doesn't wear her support hose  POSTURE: rounded shoulders, forward head, and posterior pelvic tilt  LOWER EXTREMITY ROM:     Active  Right Eval Left Eval  Hip flexion    Hip extension    Hip abduction    Hip adduction    Hip internal rotation    Hip external rotation    Knee flexion    Knee extension    Ankle dorsiflexion  decreased  Ankle plantarflexion    Ankle inversion    Ankle eversion     (Blank rows = not tested)  LOWER EXTREMITY MMT:    MMT Right Eval Left Eval  Hip flexion 5 4  Hip extension    Hip abduction    Hip adduction    Hip internal rotation    Hip external rotation    Knee flexion 5 4  Knee extension 5 4  Ankle dorsiflexion 4 2-  Ankle plantarflexion    Ankle inversion    Ankle eversion    (Blank rows = not tested)  BED MOBILITY:  Mod I per patient report; uses step stool as her bed is high  TRANSFERS: Assistive device utilized: Single point cane  Sit to stand: Modified independence Stand to sit: Modified independence Chair to chair: Modified independence Floor:  not assessed at eval  GAIT: Gait pattern: decreased step length- Right, decreased step length- Left, decreased stride length, decreased hip/knee flexion- Right, decreased hip/knee flexion- Left, decreased ankle dorsiflexion- Right, decreased ankle dorsiflexion- Left, shuffling, trunk flexed, and narrow BOS Distance walked: various clinic distances Assistive device utilized: Single point cane Level of assistance: Modified independence  TREATMENT:    Self-Care/Home Management: Vitals:   09/21/23 0937  BP: 123/80  Pulse: 72   Assessed in RUE in sitting prior to intervention.   TherAct SciFit multi-peaks level 3 for 8 minutes using BUE/BLEs for neural priming for reciprocal movement, dynamic cardiovascular warmup and increased amplitude  of stepping. RPE of 9/10 following activity.  To work on LE strengthening in // bars with BUE support and CGA: Alt L/R 6" forwards step-ups 2 x 10 reps B Alt L/R 4" eccentric step downs 2 x 10 reps B Alt L/R 6" lateral step-ups 2 x 10 reps B  L/R resisted sidesteps 2 x 10 ft    PATIENT EDUCATION: Education details: did talk to PCP office about AFO order - just waiting on order to be placed, continue HEP Person educated: Patient Education method: Explanation and Demonstration Education comprehension: verbalized understanding, returned demonstration, and needs further education  HOME EXERCISE PROGRAM: Access Code: [email or web address] URL: [email or web address] Date: 09/11/2023 Prepared by: Sherlie Ban  Exercises - Staggered Sit-to-Stand (Mirrored)  - 1-2 x daily - 5 x weekly - 2 sets - 10 reps - Standing March with Counter Support  - 1-2 x daily - 5 x weekly - 2 sets - 10 reps - Heel Toe Raises with Counter Support  - 1-2 x daily - 5 x weekly - 2 sets - 10 reps  GOALS: Goals reviewed with patient? Yes  SHORT TERM GOALS: Target date: 09/27/2023  Pt will be independent with initial HEP for improved strength, balance, transfers and gait. Baseline: Goal status: INITIAL  2.  Pt will improve 5 x STS to less than or equal to 20 seconds to demonstrate improved functional strength and transfer efficiency.  Baseline: 24.22 sec no UE (3/19) Goal status: INITIAL  3.  Pt will improve gait velocity to at least 2.0 ft/sec for improved gait efficiency and performance at mod I level  Baseline: 1.86 ft/sec with SPC (3/19) Goal status: INITIAL  4.  Pt will improve normal TUG to less than or equal to 24 seconds for improved functional mobility and decreased fall risk. Baseline: 27 sec with SPC (3/19) Goal status: INITIAL  5.  Pt will improve Berg score to 40/56 for decreased fall risk Baseline: 35/56 (3/19) Goal status: INITIAL  6.  to be assessed and STG set Baseline: 450'  with SPC  Goal status: MET  LONG TERM GOALS: Target date: 10/25/2023   Pt will be independent with final HEP for improved strength, balance, transfers and gait. Baseline:  Goal status: INITIAL  2.  Pt will improve 5 x STS to less than or equal to 15 seconds to demonstrate improved functional strength and transfer efficiency.  Baseline: 24.22 sec no UE (3/19) Goal status: INITIAL  3.  Pt will improve gait velocity to at least 2.25 ft/sec for improved gait efficiency and performance at mod I level  Baseline: 1.86 ft/sec with SPC (3/19) Goal status: INITIAL  4.  Pt will improve normal TUG to less than or equal to 21 seconds for improved functional mobility and decreased fall risk. Baseline: 27 sec with SPC (3/19) Goal status: INITIAL  5.  Pt will improve Berg score to 45/56 for decreased fall risk Baseline: 35/56 (3/19) Goal status: INITIAL  6.  Pt will ambulate at least 525' with with SPC vs. RW with no seated rest break and reporting RPE as 5/10 or less in order to demo improved gait endurance.  Baseline: 450' with SPC and CGA Goal status: INITIAL  ASSESSMENT:  CLINICAL IMPRESSION: Emphasis of skilled PT session on working on LE strengthening in // bars. Pt exhibits good tolerance and endurance for activities this session. Pt does have some onset of fatigue at end of session. Pt continues to benefit from skilled PT services to work towards increased safety and independence with functional mobility. Continue POC.    OBJECTIVE IMPAIRMENTS: Abnormal gait, cardiopulmonary status limiting activity, decreased activity tolerance, decreased balance, decreased endurance, decreased mobility, difficulty walking, decreased ROM, and decreased strength.   ACTIVITY LIMITATIONS: carrying, lifting, bending, squatting, and stairs  PARTICIPATION LIMITATIONS: meal prep, cleaning, laundry, and driving  PERSONAL FACTORS: Time since onset of injury/illness/exacerbation and 3+ comorbidities:     arthritis, asthma, depression, anxiety, GERD, HTN, neuropathy, suicide attempt, DM, CVA with residual L side deficits, rectal cancer, COPD/emphysema, pulmonary fibrosis with baseline dyspnea and chronic coughare also affecting patient's functional outcome.   REHAB POTENTIAL: Fair time since onset, history of multiple CVAs  CLINICAL DECISION MAKING: Evolving/moderate complexity  EVALUATION COMPLEXITY: Moderate  PLAN:  PT FREQUENCY: 2x/week  PT DURATION: 6 weeks  PLANNED INTERVENTIONS: 97164- PT Re-evaluation, 97110-Therapeutic exercises, 97530- Therapeutic activity, [id number]- Neuromuscular re-education, 97535- Self Care, [id number]- Manual therapy, [phone]- Gait training, [phone]- Orthotic Fit/training, [phone]- Electrical stimulation (manual), Patient/Family education, Balance training, Stair training, Taping, Dry Needling, DME instructions, Cryotherapy, and Moist heat  PLAN FOR NEXT SESSION: cancel some PT visits at end of her schedule because she got scheduled for too many and past her cert date,   assess BP, add to HEP HEP to address balance, strength, and endurance (resisted step taps, step ups, resisted lateral stepping and monster walks, airex, SciFit); LLE NMR; did we get AFO order for L AFO?   Peter Congo, PT, DPT, CSRS 09/21/23 10:15 AM

## 2023-09-21 NOTE — Therapy (Addendum)
 OUTPATIENT OCCUPATIONAL THERAPY NEURO TREATMENT  Patient Name: Erica [name] MRN: [id number] DOB:1956/06/12, 68 y.o., female Today's Date: 09/21/2023  PCP: Estevan Oaks, NP  REFERRING PROVIDER: Estevan Oaks, NP   END OF SESSION:  OT End of Session - 09/21/23 1016     Visit Number 4    Number of Visits 16    Date for OT Re-Evaluation 10/30/23    Authorization Type [employer] MCR primary, MCD secondary    Progress Note Due on Visit 10    OT Start Time 1015    OT Stop Time 1100    OT Time Calculation (min) 45 min    Equipment Utilized During Treatment FM objects    Activity Tolerance Patient tolerated treatment well    Behavior During Therapy WFL for tasks assessed/performed             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   GERD (gastroesophageal reflux disease)    with chronic cough   Headache(784.0)    "mild"   Hypertension    Loop Biotronik loop implant 12/16/2020 12/16/2020   Mental disorder    depression   Neuropathy    feet    Pain    arthritis pain - takes tramadol as needed   Pulmonary fibrosis, unspecified (HCC)    Rectal cancer (HCC)    Rectal polyp    very little bleeding with bowel movements- no pain   Stroke (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   Suicide attempt [facility])    Past Surgical History:  Procedure Laterality Date   ANAL RECTAL MANOMETRY N/A 07/13/2016   Procedure: ANO RECTAL MANOMETRY;  Surgeon: Romie Levee, MD;  Location: [facility] ENDOS[facility];  Service: Endoscopy;  Laterality: N/A;   CESAREAN SECTION     EUS N/A 11/21/2012   Procedure: LOWER ENDOSCOPIC ULTRASOUND (EUS);  Surgeon: Willis Modena, MD;  Location: [facility];  Service: Endoscopy;  Laterality: N/A;   FLEXIBLE SIGMOIDOSCOPY N/A 11/21/2012   Procedure: FLEXIBLE SIGMOIDOSCOPY;  Surgeon: Willis Modena, MD;  Location: [facility]  ENDOSCOPY;  Service: Endoscopy;  Laterality: N/A;   FLEXIBLE SIGMOIDOSCOPY N/A 01/28/2013   Procedure: FLEXIBLE SIGMOIDOSCOPY;  Surgeon: Romie Levee, MD;  Location: [facility];  Service: Endoscopy;  Laterality: N/A;   LAPAROSCOPIC LOW ANTERIOR RESECTION N/A 01/29/2013   Procedure: LAPAROSCOPIC LOW ANTERIOR RESECTION, Rigid Proctoscopy;  Surgeon: Romie Levee, MD;  Location: [facility];  Service: General;  Laterality: N/A;   LAPAROSCOPIC SIGMOID COLECTOMY N/A 11/14/2012   Procedure: DIAGNOSTIC LAPAROSCOPY AND SIGMOIDMOIDOSCOPY ;  Surgeon: Emelia Loron, MD;  Location: [facility];  Service: General;  Laterality: N/A;   RECTAL ULTRASOUND N/A 07/13/2016   Procedure: RECTAL ULTRASOUND;  Surgeon: Romie Levee, MD;  Location: [facility];  Service: Endoscopy;  Laterality: N/A;   TONSILLECTOMY     TONSILLECTOMY AND ADENOIDECTOMY     Patient Active Problem List   Diagnosis Date Noted   Syncope 03/18/2023   Stroke (cerebrum) (HCC) 03/08/2023   Acute ischemic stroke (HCC) 03/05/2023   Stroke-like symptom 03/04/2023   Research study patient 07/15/2022   Encounter for loop recorder check 01/02/2021   Loop Biotronik loop implant 12/16/2020 12/16/2020   Leukopenia    Controlled type 2 diabetes mellitus with hyperglycemia, with long-term current use of insulin (HCC)    Vascular headache    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   Sinus bradycardia on ECG 11/03/2020   Benign essential HTN    Pancytopenia (HCC)    Stage 3b chronic kidney disease (HCC)    Anemia    Labile blood glucose    Labile blood pressure    Diabetic peripheral neuropathy (HCC)    Essential hypertension    AKI (acute kidney injury) (HCC)    Ataxia, late effect of cerebrovascular disease    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   Acute CVA (cerebrovascular accident) (HCC) 06/26/2020   CVA (cerebral vascular accident) (HCC) 06/25/2020   BMI 32.0-32.9,adult 06/03/2020   Left upper lobe  pneumonia 03/30/2020   Chest pain 10/01/2019   MDD (major depressive disorder), recurrent severe, 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   Suicide attempt (HCC) 05/09/2019   Cocaine abuse (HCC) 05/09/2019   Interstitial lung disease (HCC) 06/21/2016   COPD (chronic obstructive pulmonary disease) (HCC) 06/21/2016   Cough 06/21/2016   GERD (gastroesophageal reflux disease) 06/21/2016   Hypersomnia 06/21/2016   Tobacco user 06/21/2016   Rectal cancer (HCC) 01/30/2013    ONSET DATE: 07/04/23 (referral date)  REFERRING DIAG:  Z86.73 (ICD-10-CM) - Personal history of transient cerebral ischemia  I69.354 (ICD-10-CM) - Hemiparesis affecting left side as late effect of cerebrovascular accident (HCC)  M15.9 (ICD-10-CM) - Generalized osteoarthrosis, involving multiple sites    THERAPY DIAG:  Unsteadiness on feet  Other symptoms and signs involving the nervous system  Muscle weakness (generalized)  Visuospatial deficit  Other lack of coordination  Rationale for Evaluation and Treatment: Rehabilitation  SUBJECTIVE:   SUBJECTIVE STATEMENT: No new falls, no pain.  I'm folding clothes a little better and I'm not dropping things as much from my Lt hand  Pt accompanied by: self  PERTINENT HISTORY: arthritis especially in shoulders, asthma, COPD, depression, DM, hx of multiple strokes, GERD, HTN, loop recorder implant 12/16/20, neuropathy, hx of rectal cancer, pulmonary fibrosis, suicide attempt  Per 06/21/23 NP Progress Notes: history of recurrent ischemic stroke.  She had 2 strokes in 2022 (January and May), again in September 2024 and then October 2024.  She is currently off Plavix and only on aspirin 81 mg PO daily.  She had a LOOP recorder placed in July 2022... She quit smoking 3 years ago after her first stroke... She has remote history of rectal cancer stage I (T1, N0, cM0) followed by Dr. Mosetta Putt... She has mild weakness on the left side including foot drop. Tingling noted in the left side  as well post stroke.  Tingling in hands with cold    PRECAUTIONS: Fall and Other: loop implant, no driving    WEIGHT BEARING RESTRICTIONS: No  PAIN:  Are you having pain?  No but wears glove Lt hand d/t feeling "cold"   FALLS: Has patient fallen in last 6 months? No  LIVING ENVIRONMENT: Lives with: lives alone (24 hr care from family) but looking into an Assisted Living Facility Lives in: apartment on first  floor, washer/dryer in apt Has following equipment at home: Single point cane, Environmental consultant - 2 wheeled, Wheelchair (manual), shower chair, and bed side commode  PLOF: Independent with basic ADLs prior to December  PATIENT GOALS: speech primary goal, balance and Lt hand/UE secondary  OBJECTIVE:  Note: Objective measures were completed at Evaluation unless otherwise noted.  HAND DOMINANCE: Right  ADLs: Overall ADLs: overall mod I to supervision for BADLs Transfers/ambulation related to ADLs: walks w/ cane in house Eating: difficulty cutting food Grooming: mod I  UB Dressing: mod I  LB Dressing: mod I  Toileting: mod I  Bathing: usually has supervision and occasional assist to wash back, but not always - therapist recommended ALWAYS having someone there when she is showering for safety reasons Tub Shower transfers: close supervision Equipment: Shower seat with back and Grab bars  IADLs: Shopping: pt uses scooter, sister drives pt to store Light housekeeping: does light cleaning (swifer, washes dishes, laundry) - pt's sister does vacuuming/heavier cleaning Meal Prep: pt orders out a lot, does microwave, cold prep meal Community mobility: relies on sister or [employer], public transportation Medication management: pt's sister places meds in pillbox each week Financial management: son takes care of bills Handwriting: 100% legible and print (some expressive aphasia)  MOBILITY STATUS:  uses cane in apt, walker more when out   UPPER EXTREMITY ROM:  RUE WNL's, LUE WFL's but does have  to focus/attend to Lt side d/t decreased sensation and attention to Lt side.   UPPER EXTREMITY MMT:   not tested d/t previous Lt shoulder issues   HAND FUNCTION: Grip strength: Right: 38.1 lbs; Left: 35.4 lbs  COORDINATION: 9 Hole Peg test: Right: 26.64 sec; Left: 40.29 sec  SENSATION: Light touch: WFL 2 pt discrimination impaired  Lt hand w/ Rt hand simultaneous stimulation Lt hand feels cold, numbness/tingling Reports dropping things from Lt hand  EDEMA: none in hands   COGNITION: Overall cognitive status:  word finding difficulties (mild expressive and ? Some receptive aphasia vs. Slower processing info), pt also reports memory deficits and reports being diagnosed with dementia  VISION: Subjective report: I wear glasses but I lost them. My Lt eye doesn't work as well Baseline vision: Wears glasses all the time (but lost them) Visual history:  none reported  VISION ASSESSMENT: To be further assessed in functional context ? Lt peripheral loss   PERCEPTION: Not tested  PRAXIS: Not tested  OBSERVATIONS: reports dropping things from Lt hand, wears copper glove Lt hand d/t feeling "cold"                                                                                                                            TREATMENT DATE: 09/21/23  Playing golf solitaire with max cues first time to scan and comprehend game/memory and problem solving - with assistance, pt won by 6 cards (sub par)  2nd trial of golf solitaire - continued to require max cues/assist d/t deficits in memory, carryover, problem solving, and visual scanning. Pt also required constant cueing to use Lt hand. Pt lost by 6 cards (above par).   *Task appeared too difficult for patient as she could not remember rules of game (going up or down 1 from card) and problem solving to choose correct card, even w/ multiple review. Pt required cueing for [facility] card w/ little carryover. Pt will need simpler task next time for visual  scanning/coordination activities  PATIENT EDUCATION: Education details: see above Person educated: Patient Education method: Explanation, Demonstration,  Tactile cues, Verbal cues, and Handouts Education comprehension: verbalized understanding, returned demonstration, verbal cues required, tactile cues required, and needs further education  HOME EXERCISE PROGRAM:  09/11/23 - Coordination activities with images provided  GOALS: Goals reviewed with patient? Yes  SHORT TERM GOALS: Target date: 09/30/23  Independent with initial HEP for LUE (coordination, ROM) Baseline: Goal status: IN Progress  2.  Pt to verbalize safety considerations for decreased sensation Lt hand Baseline:  Goal status: INITIAL  3.  Pt to attend to Lt side with tabletop and near body scanning and report less drops LUE Baseline:  Goal status: IN Progress  4.  Pt to perform tabletop scanning with no cues required to look far Lt Baseline:  Goal status: IN Progress   LONG TERM GOALS: Target date: 10/30/23  Independent with updated HEP  Baseline:  Goal status: IN Progress  2.  Pt to improve LUE coordination as evidenced by performing 9 hole peg test in 35 sec or less Baseline: 40 sec Goal status: IN Progress  3.  Pt to use Lt hand consistently as assist for bilateral tasks  Baseline:  Goal status: IN Progress  4.  Pt to demo dynamic standing task for 10 min w/o LOB for IADLS  Baseline:  Goal status: INITIAL  5.  Environmental scanning with 85% or greater accuracy Baseline:  Goal status: INITIAL   ASSESSMENT:  CLINICAL IMPRESSION: Patient is a 68 y.o. female who was seen today for occupational therapy treatment for h/o strokes (2022 and 2024) with residual Lt hemiparesis, decreased sensation, coordination, attention to Lt side and potential field cut to Lt side. Pt reports increased function (folding laundry) and less drops Lt hand. However, limited by cognitive deficits. Learned non use of Lt hand  vs neglect noted. Pt will benefit from further skilled OT services in the outpatient setting to work on UE impairments as noted at eval to help pt return to [facility] as able.   PERFORMANCE DEFICITS: in functional skills including ADLs, IADLs, coordination, dexterity, proprioception, sensation, strength, Fine motor control, Gross motor control, mobility, balance, body mechanics, decreased knowledge of precautions, decreased knowledge of use of DME, vision, and UE functional use, cognitive skills including attention, memory, and safety awareness, and psychosocial skills including coping strategies.   IMPAIRMENTS: are limiting patient from ADLs, IADLs, leisure, and social participation.   CO-MORBIDITIES: may have co-morbidities  that affects occupational performance. Patient will benefit from skilled OT to address above impairments and improve overall function.  REHAB POTENTIAL: Good  PLAN:  OT FREQUENCY: 2x/week  OT DURATION: 8 weeks (may reduce down to 1x/wk if pt unable to get here consistently at 2x/wk)  PLANNED INTERVENTIONS: 97535 self care/ADL training, 78295 therapeutic exercise, 97530 therapeutic activity, 97112 neuromuscular re-education, 97140 manual therapy, 97035 ultrasound, 97039 fluidotherapy, 97010 moist heat, functional mobility training, visual/perceptual remediation/compensation, patient/family education, and DME and/or AE instructions  RECOMMENDED OTHER SERVICES: none at this time  CONSULTED AND AGREED WITH PLAN OF CARE: Patient  PLAN FOR NEXT SESSION:   visual scanning/attention to Lt side - maybe with digital/analogue times (simple side) or peg design, also practice carrying items in Lt hand while ambulating with cane in Rt hand (use gait belt)    Sheran Lawless, OT 09/21/2023, 10:17 AM

## 2023-09-25 ENCOUNTER — Inpatient Hospital Stay (HOSPITAL_COMMUNITY)
Admission: EM | Admit: 2023-09-25 | Discharge: 2023-09-28 | DRG: 065 | Disposition: A | Discharge status: Rehabilitation Facility | Attending: Internal Medicine | Admitting: Internal Medicine

## 2023-09-25 ENCOUNTER — Other Ambulatory Visit: Payer: Self-pay

## 2023-09-25 ENCOUNTER — Emergency Department (HOSPITAL_COMMUNITY)

## 2023-09-25 ENCOUNTER — Ambulatory Visit

## 2023-09-25 ENCOUNTER — Encounter: Payer: Self-pay | Admitting: Physical Therapy

## 2023-09-25 ENCOUNTER — Ambulatory Visit: Admitting: Occupational Therapy

## 2023-09-25 ENCOUNTER — Ambulatory Visit: Admitting: Physical Therapy

## 2023-09-25 VITALS — BP 161/98 | HR 67

## 2023-09-25 DIAGNOSIS — Z823 Family history of stroke: Secondary | ICD-10-CM

## 2023-09-25 DIAGNOSIS — J449 Chronic obstructive pulmonary disease, unspecified: Secondary | ICD-10-CM | POA: Diagnosis present

## 2023-09-25 DIAGNOSIS — J84112 Idiopathic pulmonary fibrosis: Secondary | ICD-10-CM | POA: Diagnosis present

## 2023-09-25 DIAGNOSIS — R2681 Unsteadiness on feet: Secondary | ICD-10-CM

## 2023-09-25 DIAGNOSIS — I6389 Other cerebral infarction: Secondary | ICD-10-CM

## 2023-09-25 DIAGNOSIS — R29810 Facial weakness: Secondary | ICD-10-CM | POA: Diagnosis present

## 2023-09-25 DIAGNOSIS — Z7982 Long term (current) use of aspirin: Secondary | ICD-10-CM

## 2023-09-25 DIAGNOSIS — I6930 Unspecified sequelae of cerebral infarction: Secondary | ICD-10-CM

## 2023-09-25 DIAGNOSIS — R41841 Cognitive communication deficit: Secondary | ICD-10-CM

## 2023-09-25 DIAGNOSIS — M6281 Muscle weakness (generalized): Secondary | ICD-10-CM

## 2023-09-25 DIAGNOSIS — Z800 Family history of malignant neoplasm of digestive organs: Secondary | ICD-10-CM

## 2023-09-25 DIAGNOSIS — I69354 Hemiplegia and hemiparesis following cerebral infarction affecting left non-dominant side: Secondary | ICD-10-CM

## 2023-09-25 DIAGNOSIS — Z79899 Other long term (current) drug therapy: Secondary | ICD-10-CM

## 2023-09-25 DIAGNOSIS — E162 Hypoglycemia, unspecified: Secondary | ICD-10-CM | POA: Diagnosis not present

## 2023-09-25 DIAGNOSIS — F419 Anxiety disorder, unspecified: Secondary | ICD-10-CM | POA: Diagnosis present

## 2023-09-25 DIAGNOSIS — Z801 Family history of malignant neoplasm of trachea, bronchus and lung: Secondary | ICD-10-CM

## 2023-09-25 DIAGNOSIS — F32A Depression, unspecified: Secondary | ICD-10-CM | POA: Diagnosis present

## 2023-09-25 DIAGNOSIS — E1140 Type 2 diabetes mellitus with diabetic neuropathy, unspecified: Secondary | ICD-10-CM | POA: Diagnosis present

## 2023-09-25 DIAGNOSIS — I639 Cerebral infarction, unspecified: Secondary | ICD-10-CM | POA: Diagnosis present

## 2023-09-25 DIAGNOSIS — E1159 Type 2 diabetes mellitus with other circulatory complications: Secondary | ICD-10-CM | POA: Diagnosis present

## 2023-09-25 DIAGNOSIS — M19011 Primary osteoarthritis, right shoulder: Secondary | ICD-10-CM | POA: Diagnosis present

## 2023-09-25 DIAGNOSIS — E785 Hyperlipidemia, unspecified: Secondary | ICD-10-CM | POA: Diagnosis present

## 2023-09-25 DIAGNOSIS — Z7902 Long term (current) use of antithrombotics/antiplatelets: Secondary | ICD-10-CM

## 2023-09-25 DIAGNOSIS — Z8349 Family history of other endocrine, nutritional and metabolic diseases: Secondary | ICD-10-CM

## 2023-09-25 DIAGNOSIS — I6381 Other cerebral infarction due to occlusion or stenosis of small artery: Principal | ICD-10-CM | POA: Diagnosis present

## 2023-09-25 DIAGNOSIS — J439 Emphysema, unspecified: Secondary | ICD-10-CM | POA: Diagnosis present

## 2023-09-25 DIAGNOSIS — E1165 Type 2 diabetes mellitus with hyperglycemia: Secondary | ICD-10-CM | POA: Diagnosis not present

## 2023-09-25 DIAGNOSIS — Z880 Allergy status to penicillin: Secondary | ICD-10-CM

## 2023-09-25 DIAGNOSIS — R471 Dysarthria and anarthria: Secondary | ICD-10-CM | POA: Diagnosis present

## 2023-09-25 DIAGNOSIS — Z833 Family history of diabetes mellitus: Secondary | ICD-10-CM

## 2023-09-25 DIAGNOSIS — M19012 Primary osteoarthritis, left shoulder: Secondary | ICD-10-CM | POA: Diagnosis present

## 2023-09-25 DIAGNOSIS — J4489 Other specified chronic obstructive pulmonary disease: Secondary | ICD-10-CM | POA: Diagnosis present

## 2023-09-25 DIAGNOSIS — Z87891 Personal history of nicotine dependence: Secondary | ICD-10-CM

## 2023-09-25 DIAGNOSIS — Z66 Do not resuscitate: Secondary | ICD-10-CM | POA: Diagnosis present

## 2023-09-25 DIAGNOSIS — Z794 Long term (current) use of insulin: Secondary | ICD-10-CM

## 2023-09-25 DIAGNOSIS — Z85048 Personal history of other malignant neoplasm of rectum, rectosigmoid junction, and anus: Secondary | ICD-10-CM

## 2023-09-25 DIAGNOSIS — E1122 Type 2 diabetes mellitus with diabetic chronic kidney disease: Secondary | ICD-10-CM | POA: Diagnosis present

## 2023-09-25 DIAGNOSIS — Z8249 Family history of ischemic heart disease and other diseases of the circulatory system: Secondary | ICD-10-CM

## 2023-09-25 DIAGNOSIS — E1169 Type 2 diabetes mellitus with other specified complication: Secondary | ICD-10-CM | POA: Diagnosis present

## 2023-09-25 DIAGNOSIS — E11649 Type 2 diabetes mellitus with hypoglycemia without coma: Secondary | ICD-10-CM

## 2023-09-25 DIAGNOSIS — N1832 Chronic kidney disease, stage 3b: Secondary | ICD-10-CM | POA: Diagnosis present

## 2023-09-25 DIAGNOSIS — E781 Pure hyperglyceridemia: Secondary | ICD-10-CM | POA: Diagnosis present

## 2023-09-25 DIAGNOSIS — R2689 Other abnormalities of gait and mobility: Secondary | ICD-10-CM

## 2023-09-25 DIAGNOSIS — I152 Hypertension secondary to endocrine disorders: Secondary | ICD-10-CM | POA: Diagnosis present

## 2023-09-25 DIAGNOSIS — R29708 NIHSS score 8: Secondary | ICD-10-CM | POA: Diagnosis present

## 2023-09-25 LAB — I-STAT CHEM 8, ED
BUN: 55 mg/dL — ABNORMAL HIGH (ref 8–23)
Calcium, Ion: 1.05 mmol/L — ABNORMAL LOW (ref 1.15–1.40)
Chloride: 112 mmol/L — ABNORMAL HIGH (ref 98–111)
Creatinine, Ser: 2 mg/dL — ABNORMAL HIGH (ref 0.44–1.00)
Glucose, Bld: 83 mg/dL (ref 70–99)
HCT: 33 % — ABNORMAL LOW (ref 36.0–46.0)
Hemoglobin: 11.2 g/dL — ABNORMAL LOW (ref 12.0–15.0)
Potassium: 6.8 mmol/L (ref 3.5–5.1)
Sodium: 137 mmol/L (ref 135–145)
TCO2: 23 mmol/L (ref 22–32)

## 2023-09-25 LAB — DIFFERENTIAL
Abs Immature Granulocytes: 0.02 10*3/uL (ref 0.00–0.07)
Basophils Absolute: 0 10*3/uL (ref 0.0–0.1)
Basophils Relative: 0 %
Eosinophils Absolute: 0.1 10*3/uL (ref 0.0–0.5)
Eosinophils Relative: 1 %
Immature Granulocytes: 0 %
Lymphocytes Relative: 22 %
Lymphs Abs: 1.1 10*3/uL (ref 0.7–4.0)
Monocytes Absolute: 0.3 10*3/uL (ref 0.1–1.0)
Monocytes Relative: 6 %
Neutro Abs: 3.5 10*3/uL (ref 1.7–7.7)
Neutrophils Relative %: 71 %

## 2023-09-25 LAB — URINALYSIS, ROUTINE W REFLEX MICROSCOPIC
Bacteria, UA: NONE SEEN
Bilirubin Urine: NEGATIVE
Glucose, UA: >=500 mg/dL — AB
Hgb urine dipstick: NEGATIVE
Ketones, ur: NEGATIVE mg/dL
Leukocytes,Ua: NEGATIVE
Nitrite: NEGATIVE
Protein, ur: NEGATIVE mg/dL
Specific Gravity, Urine: 1.012 (ref 1.005–1.030)
pH: 6 (ref 5.0–8.0)

## 2023-09-25 LAB — COMPREHENSIVE METABOLIC PANEL WITH GFR
ALT: 12 U/L (ref 0–44)
AST: 21 U/L (ref 15–41)
Albumin: 3.1 g/dL — ABNORMAL LOW (ref 3.5–5.0)
Alkaline Phosphatase: 89 U/L (ref 38–126)
Anion gap: 6 (ref 5–15)
BUN: 34 mg/dL — ABNORMAL HIGH (ref 8–23)
CO2: 20 mmol/L — ABNORMAL LOW (ref 22–32)
Calcium: 8.2 mg/dL — ABNORMAL LOW (ref 8.9–10.3)
Chloride: 109 mmol/L (ref 98–111)
Creatinine, Ser: 1.62 mg/dL — ABNORMAL HIGH (ref 0.44–1.00)
GFR, Estimated: 34 mL/min — ABNORMAL LOW (ref >60)
Glucose, Bld: 171 mg/dL — ABNORMAL HIGH (ref 70–99)
Potassium: 5 mmol/L (ref 3.5–5.1)
Sodium: 135 mmol/L (ref 135–145)
Total Bilirubin: 0.5 mg/dL (ref 0.0–1.2)
Total Protein: 6.2 g/dL — ABNORMAL LOW (ref 6.5–8.1)

## 2023-09-25 LAB — PROTIME-INR
INR: 1.1 (ref 0.8–1.2)
Prothrombin Time: 14.4 s (ref 11.4–15.2)

## 2023-09-25 LAB — CBG MONITORING, ED
Glucose-Capillary: 80 mg/dL (ref 70–99)
Glucose-Capillary: 115 mg/dL — ABNORMAL HIGH (ref 70–99)
Glucose-Capillary: 48 mg/dL — ABNORMAL LOW (ref 70–99)
Glucose-Capillary: 153 mg/dL — ABNORMAL HIGH (ref 70–99)
Glucose-Capillary: 133 mg/dL — ABNORMAL HIGH (ref 70–99)
Glucose-Capillary: 162 mg/dL — ABNORMAL HIGH (ref 70–99)

## 2023-09-25 LAB — CBC
HCT: 34.2 % — ABNORMAL LOW (ref 36.0–46.0)
Hemoglobin: 12 g/dL (ref 12.0–15.0)
MCH: 31.2 pg (ref 26.0–34.0)
MCHC: 35.1 g/dL (ref 30.0–36.0)
MCV: 88.8 fL (ref 80.0–100.0)
Platelets: 157 10*3/uL (ref 150–400)
RBC: 3.85 MIL/uL — ABNORMAL LOW (ref 3.87–5.11)
RDW: 16.3 % — ABNORMAL HIGH (ref 11.5–15.5)
WBC: 5 10*3/uL (ref 4.0–10.5)
nRBC: 0 % (ref 0.0–0.2)

## 2023-09-25 LAB — APTT: aPTT: 30 s (ref 24–36)

## 2023-09-25 LAB — ETHANOL: Alcohol, Ethyl (B): <10 mg/dL (ref <10)

## 2023-09-25 MED ORDER — ALBUTEROL SULFATE (2.5 MG/3ML) 0.083% IN NEBU: 3.0000 mL | INHALATION_SOLUTION | RESPIRATORY_TRACT | Status: DC | PRN

## 2023-09-25 MED ORDER — ACETAMINOPHEN 160 MG/5ML PO SOLN: 650.0000 mg | ORAL | Status: DC | PRN

## 2023-09-25 MED ORDER — CLOPIDOGREL BISULFATE 75 MG PO TABS
75.0000 mg | ORAL_TABLET | Freq: Every day | ORAL | Status: DC
  Administered 2023-09-26 – 2023-09-28 (×3): 75 mg via ORAL
  Filled 2023-09-25 (×3): qty 1

## 2023-09-25 MED ORDER — HYDRALAZINE HCL 20 MG/ML IJ SOLN: 10.0000 mg | INTRAMUSCULAR | Status: DC | PRN

## 2023-09-25 MED ORDER — VENLAFAXINE HCL ER 75 MG PO CP24
150.0000 mg | ORAL_CAPSULE | Freq: Every day | ORAL | Status: DC
  Administered 2023-09-26 – 2023-09-28 (×3): 150 mg via ORAL
  Filled 2023-09-25 (×3): qty 2

## 2023-09-25 MED ORDER — ONDANSETRON HCL 4 MG/2ML IJ SOLN: 4.0000 mg | Freq: Four times a day (QID) | INTRAMUSCULAR | Status: DC | PRN

## 2023-09-25 MED ORDER — SODIUM CHLORIDE 0.9% FLUSH: 3.0000 mL | Freq: Once | INTRAVENOUS | Status: DC

## 2023-09-25 MED ORDER — ROSUVASTATIN CALCIUM 20 MG PO TABS
40.0000 mg | ORAL_TABLET | Freq: Every day | ORAL | Status: DC
  Administered 2023-09-26 – 2023-09-28 (×3): 40 mg via ORAL
  Filled 2023-09-25 (×3): qty 2

## 2023-09-25 MED ORDER — DEXTROSE 50 % IV SOLN
50.0000 mL | Freq: Once | INTRAVENOUS | Status: AC
  Administered 2023-09-25: 50 mL via INTRAVENOUS
  Filled 2023-09-25: qty 50

## 2023-09-25 MED ORDER — STROKE: EARLY STAGES OF RECOVERY BOOK
Freq: Once | Status: AC
  Filled 2023-09-25: qty 1

## 2023-09-25 MED ORDER — INSULIN ASPART 100 UNIT/ML IJ SOLN
0.0000 [IU] | Freq: Three times a day (TID) | INTRAMUSCULAR | Status: DC
  Administered 2023-09-27: 2 [IU] via SUBCUTANEOUS
  Administered 2023-09-27: 3 [IU] via SUBCUTANEOUS

## 2023-09-25 MED ORDER — SERTRALINE HCL 50 MG PO TABS
50.0000 mg | ORAL_TABLET | Freq: Every day | ORAL | Status: DC
  Administered 2023-09-26 – 2023-09-28 (×3): 50 mg via ORAL
  Filled 2023-09-25 (×3): qty 1

## 2023-09-25 MED ORDER — SODIUM CHLORIDE 0.9 % IV BOLUS
1000.0000 mL | Freq: Once | INTRAVENOUS | Status: AC
  Administered 2023-09-25: 1000 mL via INTRAVENOUS

## 2023-09-25 MED ORDER — MOMETASONE FURO-FORMOTEROL FUM 200-5 MCG/ACT IN AERO
2.0000 | INHALATION_SPRAY | Freq: Two times a day (BID) | RESPIRATORY_TRACT | Status: DC
  Administered 2023-09-27 – 2023-09-28 (×3): 2 via RESPIRATORY_TRACT
  Filled 2023-09-25 (×2): qty 8.8

## 2023-09-25 MED ORDER — IOHEXOL 300 MG/ML  SOLN
75.0000 mL | Freq: Once | INTRAMUSCULAR | Status: AC | PRN
  Administered 2023-09-25: 75 mL via INTRAVENOUS

## 2023-09-25 MED ORDER — SENNOSIDES-DOCUSATE SODIUM 8.6-50 MG PO TABS: 1.0000 | ORAL_TABLET | Freq: Every evening | ORAL | Status: DC | PRN

## 2023-09-25 MED ORDER — GABAPENTIN 100 MG PO CAPS
100.0000 mg | ORAL_CAPSULE | Freq: Every day | ORAL | Status: DC | PRN
  Filled 2023-09-25: qty 1

## 2023-09-25 MED ORDER — PIRFENIDONE 801 MG PO TABS: 1.0000 | ORAL_TABLET | Freq: Three times a day (TID) | ORAL | Status: DC

## 2023-09-25 MED ORDER — HEPARIN SODIUM (PORCINE) 5000 UNIT/ML IJ SOLN
5000.0000 [IU] | Freq: Three times a day (TID) | INTRAMUSCULAR | Status: DC
  Administered 2023-09-25 – 2023-09-28 (×9): 5000 [IU] via SUBCUTANEOUS
  Filled 2023-09-25 (×8): qty 1

## 2023-09-25 MED ORDER — PANTOPRAZOLE SODIUM 40 MG PO TBEC
40.0000 mg | DELAYED_RELEASE_TABLET | Freq: Every day | ORAL | Status: DC
  Administered 2023-09-26 – 2023-09-28 (×3): 40 mg via ORAL
  Filled 2023-09-25 (×3): qty 1

## 2023-09-25 MED ORDER — ASPIRIN 81 MG PO TBEC
81.0000 mg | DELAYED_RELEASE_TABLET | Freq: Every day | ORAL | Status: DC
  Administered 2023-09-26 – 2023-09-28 (×3): 81 mg via ORAL
  Filled 2023-09-25 (×3): qty 1

## 2023-09-25 MED ORDER — ACETAMINOPHEN 325 MG PO TABS
650.0000 mg | ORAL_TABLET | ORAL | Status: DC | PRN
  Administered 2023-09-26 – 2023-09-27 (×2): 650 mg via ORAL
  Filled 2023-09-25 (×3): qty 2

## 2023-09-25 MED ORDER — ACETAMINOPHEN 650 MG RE SUPP: 650.0000 mg | RECTAL | Status: DC | PRN

## 2023-09-25 NOTE — H&P (Signed)
 History and Physical    Erica [name] JYN:[id number] DOB: Nov 29, 1955 DOA: 09/25/2023  PCP: Estevan Oaks, NP  Patient coming from: Home  I have personally briefly reviewed patient's old medical records in [facility]  Chief Complaint: Right facial numbness  HPI: Erica [name] is a 68 y.o. 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  Patient states that around 6 PM last night (4/13) she noticed having numbness over the right side of her face as well as tongue.  She says she has some occasional slurring of speech at baseline but this also worsened.  The symptoms have been persistent since onset.  She has some mild residual left-sided weakness from prior stroke which was unchanged.  She usually ambulates with a cane.  Patient went to outpatient PT/SLP earlier today and when she reported her symptoms EMS were called and she was brought to the ED for further evaluation.  She has an implantable loop recorder in place which has not shown any documented episodes of atrial fibrillation so far.  ED Course  Labs/Imaging on admission: I have personally reviewed following labs and imaging studies.  Initial vitals showed BP 141/83, pulse 68, RR 17, temp 97.8 F, SpO2 96% on room air.  Labs showed WBC 5.0, hemoglobin 12.0, platelets 157, sodium 135, potassium 5.0, bicarb 20, BUN 34, creatinine 1.62, CBG 80 > 48 > 115, serum glucose 171 after D50, LFTs within normal limits, serum ethanol <10.  CT head without contrast negative for acute finding.  Numerous small cortical, cerebellar, and lacunar infarcts again noted.  CTA neck:  1. Artifact arising from a dense contrast bolus partially obscures the proximal left common carotid artery.  Within this limitation, the common carotid, internal carotid and vertebral arteries are patent within the neck without stenosis or significant atherosclerotic disease. No evidence of dissection. 2. Incompletely assessed fibrotic lung disease. 3. Emphysema (ICD10-J43.9). CTA head: 1. No proximal intracranial large vessel occlusion or high-grade proximal arterial stenosis identified. 2. Atherosclerotic plaque within the intracranial internal carotid arteries with no more than mild stenosis.  MRI brain without contrast showed acute lacunar infarct in the left thalamus.  Multiple additional remote infarcts again noted.  Moderate chronic microvascular ischemic changes and mild parenchymal volume loss seen.  Patient was given 1 L normal saline and 1 amp D50 while in the ED.  Neurology consulted and recommended medical admission for further stroke workup.  The hospitalist service was consulted to admit.  Review of Systems: All systems reviewed and are negative except as documented in history of present illness above.   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   GERD (gastroesophageal reflux disease)    with chronic cough   Headache(784.0)    "mild"   Hypertension    Loop Biotronik loop implant 12/16/2020 12/16/2020   Mental disorder    depression   Neuropathy    feet    Pain    arthritis pain - takes tramadol as needed   Pulmonary fibrosis, unspecified (HCC)    Rectal cancer (HCC)    Rectal polyp  very little bleeding with bowel movements- no pain   Stroke (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   Suicide attempt [facility])     Past Surgical History:  Procedure Laterality Date   ANAL RECTAL MANOMETRY N/A 07/13/2016   Procedure: ANO RECTAL MANOMETRY;  Surgeon: Romie Levee, MD;  Location: [facility];  Service: Endoscopy;  Laterality:  N/A;   CESAREAN SECTION     EUS N/A 11/21/2012   Procedure: LOWER ENDOSCOPIC ULTRASOUND (EUS);  Surgeon: Willis Modena, MD;  Location: [facility];  Service: Endoscopy;  Laterality: N/A;   FLEXIBLE SIGMOIDOSCOPY N/A 11/21/2012   Procedure: FLEXIBLE SIGMOIDOSCOPY;  Surgeon: Willis Modena, MD;  Location: [facility];  Service: Endoscopy;  Laterality: N/A;   FLEXIBLE SIGMOIDOSCOPY N/A 01/28/2013   Procedure: FLEXIBLE SIGMOIDOSCOPY;  Surgeon: Romie Levee, MD;  Location: [facility];  Service: Endoscopy;  Laterality: N/A;   LAPAROSCOPIC LOW ANTERIOR RESECTION N/A 01/29/2013   Procedure: LAPAROSCOPIC LOW ANTERIOR RESECTION, Rigid Proctoscopy;  Surgeon: Romie Levee, MD;  Location: [facility];  Service: General;  Laterality: N/A;   LAPAROSCOPIC SIGMOID COLECTOMY N/A 11/14/2012   Procedure: DIAGNOSTIC LAPAROSCOPY AND SIGMOIDMOIDOSCOPY ;  Surgeon: Emelia Loron, MD;  Location: [facility];  Service: General;  Laterality: N/A;   RECTAL ULTRASOUND N/A 07/13/2016   Procedure: RECTAL ULTRASOUND;  Surgeon: Romie Levee, MD;  Location: [facility];  Service: Endoscopy;  Laterality: N/A;   TONSILLECTOMY     TONSILLECTOMY AND ADENOIDECTOMY      Social History: Social History   Tobacco Use   Smoking status: Former    Current packs/day: 0.00    Average packs/day: 0.5 packs/day for 20.0 years (10.0 ttl pk-yrs)    Types: Cigarettes    Start date: 01/05/1998    Quit date: 01/05/2018    Years since quitting: 5.7   Smokeless tobacco: Never   Tobacco comments:    Unknown   Vaping Use   Vaping status: Never Used  Substance Use Topics   Alcohol use: Not Currently   Drug use: Not Currently    Types: "Crack" cocaine    Comment: Unknown    Allergies  Allergen Reactions   Penicillins Hives, Itching and Swelling    Tongue swelling    Family History  Problem Relation Age of Onset   Hypertension Father    Stroke Father    Diabetes Father    Heart disease Mother    Hypertension Mother    Hyperlipidemia  Mother    Hypertension Sister    Hypertension Brother    Hypertension Sister    Hypertension Brother    Cancer Other 49       colon cancer    Cancer Maternal Aunt        cancer, unknown type    Cancer Maternal Uncle        bone cancer    Cancer Paternal Aunt        lung cancer   Cancer Paternal Uncle        lung cancer   Cancer Maternal Uncle        colon cancer and prostate cancer    Cancer Maternal Uncle        prostate cancer      Prior to Admission medications   Medication Sig Start Date End Date Taking? Authorizing Provider  albuterol (VENTOLIN HFA) 108 (90 Base) MCG/ACT inhaler Inhale 2 puffs into the lungs every 4 (four) hours as needed for wheezing. Patient taking differently: Inhale 2 puffs into the lungs 2 (two) times daily as needed for wheezing. 11/20/20  Yes Angiulli, Mcarthur Rossetti, PA-C  amLODipine (NORVASC) 10 MG tablet Take 1 tablet (10 mg total) by mouth daily. 03/14/23  Yes Love, Evlyn Kanner, PA-C  cetirizine (ZYRTEC) 10 MG tablet Take 1 tablet (10 mg total) by mouth daily. 06/09/23 09/25/23 Yes Maxwell Marion, PA-C  clopidogrel (PLAVIX) 75 MG tablet Take 75 mg by mouth daily. 09/04/23  Yes [provider]  diclofenac Sodium (VOLTAREN) 1 % GEL Apply 1 Application topically 2 (two) times daily as needed (pain). 08/20/22  Yes [provider]  DULoxetine (CYMBALTA) 30 MG capsule Take 30 mg by mouth daily. 09/04/23  Yes [provider]  FARXIGA 10 MG TABS tablet Take 10 mg by mouth daily. 05/14/23  Yes [provider]  gabapentin (NEURONTIN) 100 MG capsule Take 1 capsule (100 mg total) by mouth 2 (two) times daily. Patient is taking as needed Patient taking differently: Take 100 mg by mouth daily as needed (pain). 03/14/23  Yes Love, Evlyn Kanner, PA-C  hydrALAZINE (APRESOLINE) 25 MG tablet SMARTSIG:1.5 Tablet(s) By Mouth Every 8 Hours 09/05/23  Yes [provider]  LANTUS SOLOSTAR 100 UNIT/ML Solostar Pen Inject 30 Units into the skin daily.    Yes [provider]  naphazoline-glycerin (CLEAR EYES REDNESS) 0.012-0.25 % SOLN Place 1-2 drops into both eyes 3 (three) times daily with meals. Patient taking differently: Place 2 drops into both eyes daily as needed for eye irritation. 03/14/23  Yes Love, Evlyn Kanner, PA-C  pantoprazole (PROTONIX) 40 MG tablet Take 40 mg by mouth daily.   Yes [provider]  Pirfenidone 801 MG TABS TAKE 1 TABLET (801 MG) BY MOUTH WITH BREAKFAST, WITH LUNCH, AND WITH EVENING MEAL. Patient taking differently: Take 1 tablet by mouth in the morning and at bedtime. 08/22/22  Yes Kalman Shan, MD  rosuvastatin (CRESTOR) 40 MG tablet Take 1 tablet (40 mg total) by mouth daily. 03/14/23 09/25/23 Yes Love, Evlyn Kanner, PA-C  sertraline (ZOLOFT) 50 MG tablet Take 1 tablet (50 mg total) by mouth daily. 03/14/23  Yes Love, Evlyn Kanner, PA-C  triamcinolone cream (KENALOG) 0.1 % Apply 1 Application topically daily.   Yes [provider]  venlafaxine XR (EFFEXOR-XR) 150 MG 24 hr capsule Take 1 capsule (150 mg total) by mouth daily. 03/14/23  Yes Love, Evlyn Kanner, PA-C  sacubitril-valsartan (ENTRESTO) 49-51 MG Take 1 tablet by mouth 2 (two) times daily. Patient not taking: Reported on 09/25/2023 03/14/23   Jacquelynn Cree, PA-C  SYMBICORT 160-4.5 MCG/ACT inhaler INHALE 2 PUFFS INTO THE LUNGS TWICE DAILY Patient not taking: Reported on 09/25/2023 10/18/21   Kalman Shan, MD  traZODone (DESYREL) 100 MG tablet Take 1 tablet (100 mg total) by mouth at bedtime. Patient not taking: Reported on 09/25/2023 03/14/23   Love, Evlyn Kanner, PA-C  Vitamin D, Ergocalciferol, (DRISDOL) 1.25 MG (50000 UNIT) CAPS capsule Take 50,000 Units by mouth once a week. Patient not taking: Reported on 09/25/2023 05/04/23   [provider]    Physical Exam: Vitals:   09/25/23 1730 09/25/23 1745 09/25/23 1913 09/25/23 1957  BP: (!) 156/80 (!) 147/73 (!) 143/80   Pulse:   62   Resp: 18 17 16    Temp:    97.7 F (36.5 C)  TempSrc:     Oral  SpO2:  98% 100%    Constitutional: Resting in bed, NAD, calm, comfortable Eyes: PERRL, EOMI, lids and conjunctivae normal ENMT: Mucous membranes are moist. Posterior pharynx clear of any exudate or lesions.Normal dentition.  Neck: normal, supple, no  masses. Respiratory: clear to auscultation bilaterally, no wheezing, no crackles. Normal respiratory effort. No accessory muscle use.  Cardiovascular: Regular rate and rhythm, no murmurs / rubs / gallops. No extremity edema. 2+ pedal pulses. Abdomen: no tenderness, no masses palpated. Musculoskeletal: no clubbing / cyanosis. No joint deformity upper and lower extremities. Good ROM, no contractures. Normal muscle tone.  Skin: no rashes, lesions, ulcers. No induration Neurologic: Sensation decreased right face compared to left otherwise intact.  No facial droop or dysarthria.  No aphasia.  Strength only slightly weaker on the left but grossly 5/5 in all 4.  Psychiatric: Normal judgment and insight. Alert and oriented x 3. Normal mood.   EKG: Personally reviewed. Sinus rhythm, rate 64, no acute ischemic changes.  Similar to prior.  Assessment/Plan Principal Problem:   Acute lacunar infarction in the left thalamus [facility]) Active Problems:   History of CVA with residual deficit   Type 2 diabetes mellitus with hypoglycemia, with long-term current use of insulin (HCC)   Hypertension associated with diabetes (HCC)   COPD (chronic obstructive pulmonary disease) (HCC)   Idiopathic pulmonary fibrosis (HCC)   Chronic kidney disease, stage 3b (HCC)   Hyperlipidemia associated with type 2 diabetes mellitus (HCC)   Erica [name] is a 68 y.o. 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  Assessment and Plan: Acute lacunar infarct of left thalamus History of recurrent stroke: Patient presenting with new right facial and tongue numbness.  MRI brain shows acute lacunar infarct in the left thalamus in addition to multiple remote infarcts which appeared similar to 2024 imaging. - Neurology following - CTA head/neck negative for emergent LVO or high-grade stenosis - Obtain echocardiogram - Continue Plavix 75 mg daily, add aspirin 81 mg daily - Allowing permissive hypertension for now - Keep on telemetry, continue neurochecks - Check A1c and lipid panel - Continue rosuvastatin 40 mg daily - PT/OT/SLP eval  Insulin-dependent type 2 diabetes with hypoglycemia: Patient with transient hypoglycemia in the ED, resolved with 1 amp D50 and oral intake.  Suspect this was from inadequate caloric intake with use of insulin.  Last hemoglobin A1c 6.9% on 03/05/2023. - Hold home Lantus 30 units qam and Farxiga 10 mg daily - Placed on SSI - Update hemoglobin A1c  Hypertension: Holding antihypertensives while allowing permissive hypertension.  COPD/idiopathic pulmonary fibrosis: Stable.  Continue pirfenidone, Symbicort, and albuterol as needed.  CKD stage IIIb: Renal function stable.  Hyperlipidemia: Continue rosuvastatin.  Depression/anxiety: Continue Zoloft and Effexor XR.   DVT prophylaxis: heparin injection 5,000 Units Start: 09/25/23 2200 Code Status:   Code Status: Do not attempt resuscitation (DNR) PRE-ARREST INTERVENTIONS DESIRED confirmed with patient on admission Family Communication: Sister and son at bedside Disposition Plan: From home, dispo pending clinical progress Consults called: Neurology Severity of Illness: The appropriate patient status for this patient is OBSERVATION. Observation status is judged to be reasonable and necessary in order to provide the required intensity of service to ensure the patient's safety. The patient's presenting  symptoms, physical exam findings, and initial radiographic and laboratory data in the context of their medical condition is felt to place them at decreased risk for further clinical deterioration. Furthermore, it is anticipated that the patient will be medically stable for discharge from the hospital within  2 midnights of admission.   Edith Gores MD [facility]  If 7PM-7AM, please contact night-coverage [email or web address]  09/25/2023, 8:53 PM

## 2023-09-25 NOTE — ED Notes (Signed)
 Pt dinner tray arrived and pt is eating

## 2023-09-25 NOTE — Consult Note (Addendum)
 NEUROLOGY CONSULT NOTE   Date of service: September 25, 2023 Patient Name: Erica [name] MRN:  [id number] DOB:  1955/08/11 Chief Complaint: "Feeling weak and numb" Requesting Provider: Virgina Norfolk, DO  History of Present Illness  Erica [name] is a 68 y.o. female with hx of multiple prior strokes (most recent 2022, including bithalamic, etiology most likely small vessel disease but does have a loop recorder in place), T2DM, HTN, Neuropathy, COPD, GERD, Rectal cancer, Was at neuro rehab appointment when they noted she was having worsening weakness and concerns for altered mental status. Per patient, symptoms of numbness and not feeling right started 6pm night prior.  During swallow evaluation she then also reported 2 days of increased difficulty swallowing which she finds hard to describe further  Son endorses that this does happen when her blood sugar drops (3 episodes since February, the last episode occurring approximately 1 week ago and improving approximately 3 hours after ingestion of food) but he denies difficulty with her orientation on a daily basis. She ambulates with a cane ("upgraded to a more stable cane" recently). Denies recent falls, headache, or nausea.   Patient was able to corroborate that her son was taking care of her dog over the weekend and otherwise seemed appropriately oriented to recent events.  No events captured on loop recorder although cardiology notes "Multiple asystole episodes reported without EGMs to review. We will troubleshoot with Biotronik."  On last device check 09/14/2023  Hypercoag workup with Oncology 06/21/2023 at the request of cardiology Negative Cardiolipin Ab, Prothrombin gene mutation, beta-2-glycoprotein I abs, lupus anticoag panel, protein C and S total and activity, ATIII gene testing, LDH  LKW: 1800 Modified rankin score: 3-Moderate disability-requires help but walks WITHOUT assistance IV Thrombolysis: No, outside of window EVT: No. No LVO  NIHSS  components Score: Comment  1a Level of Conscious 0[]  1[x]  2[]  3[]      1b LOC Questions 0[x]  1[]  2[]       1c LOC Commands 0[x]  1[]  2[]       2 Best Gaze 0[x]  1[]  2[]       3 Visual 0[x]  1[]  2[]  3[]      4 Facial Palsy 0[x]  1[]  2[]  3[]      5a Motor Arm - left 0[]  1[x]  2[]  3[]  4[]  UN[]    5b Motor Arm - Right 0[]  1[x]  2[]  3[]  4[]  UN[]    6a Motor Leg - Left 0[]  1[]  2[x]  3[]  4[]  UN[]    6b Motor Leg - Right 0[]  1[]  2[x]  3[]  4[]  UN[]    7 Limb Ataxia 0[x]  1[]  2[]  3[]  UN[]     8 Sensory 0[x]  1[]  2[]  UN[]    Endorses numbness in her face  9 Best Language 0[x]  1[]  2[]  3[]      10 Dysarthria 0[]  1[x]  2[]  UN[]      11 Extinct. 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   GERD (gastroesophageal reflux disease)    with chronic cough   Headache(784.0)    "mild"   Hypertension    Loop Biotronik loop implant 12/16/2020 12/16/2020   Mental disorder    depression   Neuropathy  feet    Pain    arthritis pain - takes tramadol as needed   Pulmonary fibrosis, unspecified (HCC)    Rectal cancer (HCC)    Rectal polyp    very little bleeding with bowel movements- no pain   Stroke (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   Suicide attempt [facility])     Past Surgical History:  Procedure Laterality Date   ANAL RECTAL MANOMETRY N/A 07/13/2016   Procedure: ANO RECTAL MANOMETRY;  Surgeon: Joyce Nixon, MD;  Location: [facility];  Service: Endoscopy;  Laterality: N/A;   CESAREAN SECTION     EUS N/A 11/21/2012   Procedure: LOWER ENDOSCOPIC ULTRASOUND (EUS);  Surgeon: Evangeline Hilts, MD;  Location: [facility];  Service: Endoscopy;  Laterality: N/A;   FLEXIBLE SIGMOIDOSCOPY N/A 11/21/2012   Procedure: FLEXIBLE SIGMOIDOSCOPY;   Surgeon: Evangeline Hilts, MD;  Location: [facility];  Service: Endoscopy;  Laterality: N/A;   FLEXIBLE SIGMOIDOSCOPY N/A 01/28/2013   Procedure: FLEXIBLE SIGMOIDOSCOPY;  Surgeon: Joyce Nixon, MD;  Location: [facility];  Service: Endoscopy;  Laterality: N/A;   LAPAROSCOPIC LOW ANTERIOR RESECTION N/A 01/29/2013   Procedure: LAPAROSCOPIC LOW ANTERIOR RESECTION, Rigid Proctoscopy;  Surgeon: Joyce Nixon, MD;  Location: [facility];  Service: General;  Laterality: N/A;   LAPAROSCOPIC SIGMOID COLECTOMY N/A 11/14/2012   Procedure: DIAGNOSTIC LAPAROSCOPY AND SIGMOIDMOIDOSCOPY ;  Surgeon: Enid Harry, MD;  Location: [facility];  Service: General;  Laterality: N/A;   RECTAL ULTRASOUND N/A 07/13/2016   Procedure: RECTAL ULTRASOUND;  Surgeon: Joyce Nixon, MD;  Location: [facility];  Service: Endoscopy;  Laterality: N/A;   TONSILLECTOMY     TONSILLECTOMY AND ADENOIDECTOMY      Family History: Family History  Problem Relation Age of Onset   Hypertension Father    Stroke Father    Diabetes Father    Heart disease Mother    Hypertension Mother    Hyperlipidemia Mother    Hypertension Sister    Hypertension Brother    Hypertension Sister    Hypertension Brother    Cancer Other 55       colon cancer    Cancer Maternal Aunt        cancer, unknown type    Cancer Maternal Uncle        bone cancer    Cancer Paternal Aunt        lung cancer   Cancer Paternal Uncle        lung cancer   Cancer Maternal Uncle        colon cancer and prostate cancer    Cancer Maternal Uncle        prostate cancer     Social History  reports that she quit smoking about 5 years ago. Her smoking use included cigarettes. She started smoking about 25 years ago. She has a 10 pack-year smoking history. She has never used smokeless tobacco. She reports that she does not currently use alcohol. She reports that she does not currently use drugs after having used the following drugs: "Crack" cocaine.  Allergies  Allergen  Reactions   Penicillins Hives, Itching and Swelling    Tongue swelling    Medications   Current Facility-Administered Medications:    sodium chloride 0.9 % bolus 1,000 mL, 1,000 mL, Intravenous, Once, Curatolo, Adam, DO   sodium chloride flush (NS) 0.9 % injection 3 mL, 3 mL, Intravenous, Once, Curatolo, Adam, DO  Current Outpatient Medications:    acetaminophen (TYLENOL) 325 MG tablet, Take 2 tablets (650 mg total) by mouth 2 (two) times daily.,  Disp: , Rfl:    albuterol (VENTOLIN HFA) 108 (90 Base) MCG/ACT inhaler, Inhale 2 puffs into the lungs every 4 (four) hours as needed for wheezing., Disp: 1 g, Rfl: 5   amLODipine (NORVASC) 10 MG tablet, Take 1 tablet (10 mg total) by mouth daily., Disp: 30 tablet, Rfl: 0   cetirizine (ZYRTEC) 10 MG tablet, Take 1 tablet (10 mg total) by mouth daily., Disp: 30 tablet, Rfl: 0   diclofenac Sodium (VOLTAREN) 1 % GEL, SMARTSIG:4 Gram(s) Topical 4 Times Daily PRN, Disp: , Rfl:    FARXIGA 10 MG TABS tablet, Take 10 mg by mouth daily., Disp: , Rfl:    gabapentin (NEURONTIN) 100 MG capsule, Take 1 capsule (100 mg total) by mouth 2 (two) times daily. Patient is taking as needed, Disp: 60 capsule, Rfl: 0   HYDROcodone-acetaminophen (NORCO/VICODIN) 5-325 MG tablet, Take 1 tablet by mouth every 6 (six) hours as needed., Disp: , Rfl:    loratadine (CLARITIN) 10 MG tablet, Take 1 tablet (10 mg total) by mouth daily as needed for allergies., Disp: 30 tablet, Rfl: 0   naphazoline-glycerin (CLEAR EYES REDNESS) 0.012-0.25 % SOLN, Place 1-2 drops into both eyes 3 (three) times daily with meals., Disp: , Rfl:    pantoprazole (PROTONIX) 40 MG tablet, Take 40 mg by mouth daily., Disp: , Rfl:    Pirfenidone 801 MG TABS, TAKE 1 TABLET (801 MG) BY MOUTH WITH BREAKFAST, WITH LUNCH, AND WITH EVENING MEAL. (Patient taking differently: Take 1 tablet by mouth daily. Patient is taking 1 tablet daily current with breakfast), Disp: 270 tablet, Rfl: 0   rosuvastatin (CRESTOR) 40 MG  tablet, Take 1 tablet (40 mg total) by mouth daily., Disp: 30 tablet, Rfl: 0   sacubitril-valsartan (ENTRESTO) 49-51 MG, Take 1 tablet by mouth 2 (two) times daily., Disp: 60 tablet, Rfl: 0   sertraline (ZOLOFT) 50 MG tablet, Take 1 tablet (50 mg total) by mouth daily., Disp: 30 tablet, Rfl: 0   SYMBICORT 160-4.5 MCG/ACT inhaler, INHALE 2 PUFFS INTO THE LUNGS TWICE DAILY, Disp: 10.2 g, Rfl: 5   traZODone (DESYREL) 100 MG tablet, Take 1 tablet (100 mg total) by mouth at bedtime., Disp: 30 tablet, Rfl: 0   triamcinolone cream (KENALOG) 0.1 %, Apply 1 Application topically 2 (two) times daily., Disp: , Rfl:    venlafaxine XR (EFFEXOR-XR) 150 MG 24 hr capsule, Take 1 capsule (150 mg total) by mouth daily., Disp: 30 capsule, Rfl: 0   Vitamin D, Ergocalciferol, (DRISDOL) 1.25 MG (50000 UNIT) CAPS capsule, Take 50,000 Units by mouth once a week., Disp: , Rfl:   Vitals   Vitals:   09/25/23 1206  Temp: 97.8 F (36.6 C)  TempSrc: Oral    There is no height or weight on file to calculate BMI.  Physical Exam   Constitutional: Appears tired. Poor attention and concentration. Psych: Affect appropriate to situation.  Overall flat affect Eyes: No scleral injection.  HENT: No OP obstruction.  Head: Normocephalic.  Cardiovascular: Normal rate and regular rhythm.  Respiratory: Effort normal, non-labored breathing.  GI: Soft.  No distension. There is no tenderness.  Skin: WDI.   Neurologic Examination   Mental Status: Patient is drowsy, but oriented to person, place, month, year, and situation. Patient is able to give history, but intermittently very slow to respond No frank aphasia or neglect, though her speech is sparse and she names hammock as a swing for example Cranial Nerves: II: Visual Fields are full. Pupils are equal, round, and reactive to  light.   III,IV, VI: EOMI without ptosis or diploplia.  V: Facial sensation is symmetric to temperature VII: Facial movement is symmetric resting  and smiling VIII: Hearing is intact to voice X: Palate elevates symmetrically XI: Shoulder shrug is symmetric. XII: Tongue protrudes midline without atrophy or fasciculations.  Motor: She is weakly antigravity throughout.  Slightly more drift of the left than the right but can maintain all extremities antigravity with encouragement.  Variable effort on exam at different times Sensory: Pt reports sensory disturbance, but is able to correctly identify all tested locations. Pt reports facial numbness on the right face and in the right fingertips Cerebellar: Pt is too lethargic to cooperate with this portion of the exam initially but completes it later on repeat testing, with bradykinesis and incomplete targeting but no clear ataxia.  Labs/Imaging/Neurodiagnostic studies   CBC:  Recent Labs  Lab 2023/10/21 1130  HGB 11.2*  HCT 33.0*   Basic Metabolic Panel:  Lab Results  Component Value Date   NA 137 10/21/2023   K 6.8 (HH) 21-Oct-2023   CO2 25 06/21/2023   GLUCOSE 83 10-21-23   BUN 55 (H) 10-21-2023   CREATININE 2.00 (H) 2023/10/21   CALCIUM 8.6 (L) 06/21/2023   GFRNONAA 34 (L) 06/21/2023   GFRAA 42 (L) 11/30/2020   Lipid Panel:  Lab Results  Component Value Date   LDLCALC 86 03/05/2023   HgbA1c:  Lab Results  Component Value Date   HGBA1C 6.9 (H) 03/05/2023   Urine Drug Screen:     Component Value Date/Time   LABOPIA NONE DETECTED 03/18/2023 1635   LABOPIA NONE DETECTED 06/26/2020 0643   COCAINSCRNUR NONE DETECTED 03/18/2023 1635   LABBENZ NONE DETECTED 03/18/2023 1635   LABBENZ NONE DETECTED 06/26/2020 0643   AMPHETMU NONE DETECTED 03/18/2023 1635   AMPHETMU NONE DETECTED 06/26/2020 0643   THCU NONE DETECTED 03/18/2023 1635   THCU NONE DETECTED 06/26/2020 0643   LABBARB NONE DETECTED 03/18/2023 1635   LABBARB NONE DETECTED 06/26/2020 0643    Alcohol Level     Component Value Date/Time   ETH <10 03/18/2023 1400   INR  Lab Results  Component Value Date    INR 1.1 2023-10-21   APTT  Lab Results  Component Value Date   APTT 30 2023/10/21   AED levels: No results found for: "PHENYTOIN", "ZONISAMIDE", "LAMOTRIGINE", "LEVETIRACETA"  CT Head without contrast(Personally reviewed): 21-Oct-2023 IMPRESSION: 1. No acute finding. 2. Numerous small cortical, cerebellar, and lacunar infarcts as on head CT 06/09/2023.  CT angio Head and Neck with contrast(Personally reviewed): Oct 21, 2023 CTA neck: 1. Artifact arising from a dense contrast bolus partially obscures the proximal left common carotid artery. Within this limitation, the common carotid, internal carotid and vertebral arteries are patent within the neck without stenosis or significant atherosclerotic disease. No evidence of dissection. 2. Incompletely assessed fibrotic lung disease. 3. Emphysema (ICD10-J43.9).  CTA head: 1. No proximal intracranial large vessel occlusion or high-grade proximal arterial stenosis identified. 2. Atherosclerotic plaque within the intracranial internal carotid arteries with no more than mild stenosis.   ASSESSMENT   Erica [name] is a 68 y.o. female with a history of multiple prior infarcts, HTN, T2DM, who presents for worsening confusion, lethargy, unspecified weakness since 1800 on 4/13. She was brought to the [facility] from her outpatient neurologist's office for code stroke and was seen at 1130 AM on 10-21-23.  Initial CT Head and CTA head and neck were reassuring for acute intracranial pathology. No signs of focal stroke. Could  consider MRI, but based on timing of when her symptoms started, would expect an acute stroke to show up on CT. Given the timing of symptoms and this reassuring scan, an acute infarct / bleed is less likely.  Per patient's son, the patient sometimes has similar "spells" when she is not eating well. Patient reports that she has been eating "okay", but on swallow study today, she complains that she's not swallowing right. Pt's CBG in ED was 80, followed an  hour later by 48. Patient's blood sugar was corrected. Patient will be observed for improvements in symptoms.  RECOMMENDATIONS   Emergent recommendations: - CTA head and neck to rule out top of the basilar syndrome given rapidly worsening lethargy with EMS and new focal neurological deficits of right sided numbness initially reported; negative as above  Additional recommendations: - Appreciate ED management of hypoglycemia - MRI brain without contrast - Admit for stroke workup only if MRI brain is positive - Permissive hypertension and every 2 neurochecks pending MRI brain, cancel code stroke if MRI brain is negative - Neurology will follow-up MRI brain otherwise we will sign off at this time.  Please reach out if any other questions or concerns arise.  Discussed with ED provider in person and via secure chat  Addendum: Further recommendations given MRI brain Acute lacunar infarct in the left thalamus. Multiple additional remote infarcts as above, similar to 2024. Moderate chronic microvascular ischemic changes and mild parenchymal volume loss.  Suspect hypoglycemia leading to acute worsening but given potential for stuttering symptoms with lacunar strokes, reasonable to admit:   Admit for observation Add Aspirin 81 mg to plavix 75 mg daily  Permissive hypertension overnight Repeat ECHO as it has been > 6 months Interrogate loop recorder  Stroke team to follow  ______________________________________________________________________  Signed: Addie Addison, MD [facility], PGY-1 09/25/2023 1:02 PM  Attending Neurologist's note:  I personally saw this patient, gathering history, performing a full neurologic examination, reviewing relevant labs, personally reviewing relevant imaging including head CT, CTA head and neck, and formulated the assessment and plan, adding the note above for completeness and clarity to accurately reflect my thoughts  Baldwin Levee  MD-PhD [facility] [phone] Available 7 AM to 7 PM, outside these hours please contact Neurologist on call listed on AMION

## 2023-09-25 NOTE — Therapy (Signed)
 OUTPATIENT PHYSICAL THERAPY NEURO TREATMENT / SHORT TERM GOAL CHECK   Patient Name: ZANIYAH WERNETTE MRN: [id number] DOB:26-Aug-1955, 68 y.o., female Today's Date: 09/25/2023   PCP: Carolyn Cisco, NP REFERRING PROVIDER: Carolyn Cisco, NP  END OF SESSION:  PT End of Session - 09/25/23 1018     Visit Number 5    Number of Visits 13    Date for PT Re-Evaluation 10/25/23    Authorization Type [employer]    Authorization Time Period Approved 13 PT visits 08/30/23 - 10/25/23    PT Start Time 1017    PT Stop Time 1100    PT Time Calculation (min) 43 min    Equipment Utilized During Treatment Gait belt    Activity Tolerance Treatment limited secondary to medical complications (Comment)   symptoms of stroke   Behavior During Therapy [facility] for tasks assessed/performed               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   GERD (gastroesophageal reflux disease)    with chronic cough   Headache(784.0)    "mild"   Hypertension    Loop Biotronik loop implant 12/16/2020 12/16/2020   Mental disorder    depression   Neuropathy    feet    Pain    arthritis pain - takes tramadol as needed   Pulmonary fibrosis, unspecified (HCC)    Rectal cancer (HCC)    Rectal polyp    very little bleeding with bowel movements- no pain   Stroke (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   Suicide attempt [facility])    Past Surgical History:  Procedure Laterality Date   ANAL RECTAL MANOMETRY N/A 07/13/2016   Procedure: ANO RECTAL MANOMETRY;  Surgeon: Joyce Nixon, MD;  Location: [facility] ENDOSCOPY;  Service: Endoscopy;  Laterality: N/A;   CESAREAN SECTION     EUS N/A 11/21/2012   Procedure: LOWER ENDOSCOPIC ULTRASOUND (EUS);  Surgeon: Evangeline Hilts, MD;  Location: [facility];  Service: Endoscopy;  Laterality: N/A;   FLEXIBLE SIGMOIDOSCOPY N/A  11/21/2012   Procedure: FLEXIBLE SIGMOIDOSCOPY;  Surgeon: Evangeline Hilts, MD;  Location: [facility] ENDOSCOPY;  Service: Endoscopy;  Laterality: N/A;   FLEXIBLE SIGMOIDOSCOPY N/A 01/28/2013   Procedure: FLEXIBLE SIGMOIDOSCOPY;  Surgeon: Joyce Nixon, MD;  Location: [facility] ENDOSCOPY;  Service: Endoscopy;  Laterality: N/A;   LAPAROSCOPIC LOW ANTERIOR RESECTION N/A 01/29/2013   Procedure: LAPAROSCOPIC LOW ANTERIOR RESECTION, Rigid Proctoscopy;  Surgeon: Joyce Nixon, MD;  Location: [facility] ORS;  Service: General;  Laterality: N/A;   LAPAROSCOPIC SIGMOID COLECTOMY N/A 11/14/2012   Procedure: DIAGNOSTIC LAPAROSCOPY AND SIGMOIDMOIDOSCOPY ;  Surgeon: Enid Harry, MD;  Location: [facility] ORS;  Service: General;  Laterality: N/A;   RECTAL ULTRASOUND N/A 07/13/2016   Procedure: RECTAL ULTRASOUND;  Surgeon: Joyce Nixon, MD;  Location: [facility] ENDOSCOPY;  Service: Endoscopy;  Laterality: N/A;   TONSILLECTOMY     TONSILLECTOMY AND ADENOIDECTOMY     Patient Active Problem List   Diagnosis Date Noted   Syncope 03/18/2023   Stroke (cerebrum) (HCC) 03/08/2023   Acute ischemic stroke (HCC) 03/05/2023   Stroke-like symptom 03/04/2023   Research study patient 07/15/2022   Encounter for loop recorder check 01/02/2021   Loop Biotronik loop implant 12/16/2020 12/16/2020   Leukopenia    Controlled type 2 diabetes mellitus with hyperglycemia, with long-term  current use of insulin (HCC)    Vascular headache    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   Sinus bradycardia on ECG 11/03/2020   Benign essential HTN    Pancytopenia (HCC)    Stage 3b chronic kidney disease (HCC)    Anemia    Labile blood glucose    Labile blood pressure    Diabetic peripheral neuropathy (HCC)    Essential hypertension    AKI (acute kidney injury) (HCC)    Ataxia, late effect of cerebrovascular disease    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   Acute CVA (cerebrovascular accident) (HCC) 06/26/2020   CVA (cerebral  vascular accident) (HCC) 06/25/2020   BMI 32.0-32.9,adult 06/03/2020   Left upper lobe pneumonia 03/30/2020   Chest pain 10/01/2019   MDD (major depressive disorder), recurrent severe, 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   Suicide attempt (HCC) 05/09/2019   Cocaine abuse (HCC) 05/09/2019   Interstitial lung disease (HCC) 06/21/2016   COPD (chronic obstructive pulmonary disease) (HCC) 06/21/2016   Cough 06/21/2016   GERD (gastroesophageal reflux disease) 06/21/2016   Hypersomnia 06/21/2016   Tobacco user 06/21/2016   Rectal cancer (HCC) 01/30/2013    ONSET DATE: 07/04/2023 (referral date)  REFERRING DIAG: Z86.73 (ICD-10-CM) - Personal history of transient ischemic attack I69.354 (ICD-10-CM) - Hemiparesis affecting left side as late effect of cerebrovascular accident (HCC) M15.9 (ICD-10-CM) - Generalized osteoarthrosis, involving multiple sites  THERAPY DIAG:  Muscle weakness (generalized)  Other abnormalities of gait and mobility  Unsteadiness on feet  Rationale for Evaluation and Treatment: Rehabilitation  SUBJECTIVE:                                                                                                                                                                                             SUBJECTIVE STATEMENT: Reports no new changes. Arrives with [facility] with hand off from SLP and is wearing foot up brace today. Denies falls and near falls.   Pt accompanied by: self, took [facility] [place] to her appointment today  PERTINENT HISTORY: arthritis, asthma, depression, anxiety, GERD, HTN, neuropathy, suicide attempt, DM, CVA with residual L side deficits, rectal cancer, COPD/emphysema, pulmonary fibrosis with baseline dyspnea and chronic cough  Per Johny Nap note 05/16/23: BERTICE RISSE is a 68 y.o. year old female with recent acute to subacute bilateral centrum semiovale infarcts 02/12/2023 and left thalamic infarct on 03/18/2023 likely secondary to small vessel disease.  History  of recurrent right MCA strokes on 11/03/2020 and 06/25/2020 likely in setting of small vessel disease although unable to rule out cardioembolic source with residual left hemiparesis and sensory impairment.  Loop recorder placed 12/2020 by Dr. Albert Huff [facility]).  Vascular risk factors include HTN, HLD, DM, CAD, history of tobacco use and history of polysubstance abuse and multiple prior strokes noted on imaging.   PAIN:  Are you having pain? No  PRECAUTIONS: Fall and Other: loop recorder  RED FLAGS: None   WEIGHT BEARING RESTRICTIONS: No  FALLS: Has patient fallen in last 6 months? No  LIVING ENVIRONMENT: Lives with: lives alone, sister and son come check on her Lives in: House/apartment Stairs: No Has following equipment at home: Single point cane, Environmental consultant - 2 wheeled, and Wheelchair (manual)  PLOF: Independent with gait, Independent with transfers, and Requires assistive device for independence  PATIENT GOALS: "to learn to walk faster so I can start walking around the corner"  OBJECTIVE:  Note: Objective measures were completed at Evaluation unless otherwise noted.  DIAGNOSTIC FINDINGS:  Brain MRI 03/18/2023 IMPRESSION: 1. Acute punctate nonhemorrhagic infarct of the left thalamus. 2. Previously noted subcortical infarcts of the centrum semi ovale demonstrate expected change in are only faintly seen on the fusion weighted images without definite restricted diffusion. 3. 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 4. Remote watershed territory infarcts and diffuse subcortical white matter disease is also stable. 5. Remote lacunar infarcts of the basal ganglia and cerebellum bilaterally are stable.  COGNITION: Overall cognitive status: Within functional limits for tasks assessed   SENSATION: N/T in L hand  Light touch decreased on LLE as compared to RLE  COORDINATION: decreased in LLE due to strength impairments  EDEMA:  Occasional  edema if she doesn't wear her support hose  POSTURE: rounded shoulders, forward head, and posterior pelvic tilt  LOWER EXTREMITY ROM:     Active  Right Eval Left Eval  Hip flexion    Hip extension    Hip abduction    Hip adduction    Hip internal rotation    Hip external rotation    Knee flexion    Knee extension    Ankle dorsiflexion  decreased  Ankle plantarflexion    Ankle inversion    Ankle eversion     (Blank rows = not tested)  LOWER EXTREMITY MMT:    MMT Right Eval Left Eval  Hip flexion 5 4  Hip extension    Hip abduction    Hip adduction    Hip internal rotation    Hip external rotation    Knee flexion 5 4  Knee extension 5 4  Ankle dorsiflexion 4 2-  Ankle plantarflexion    Ankle inversion    Ankle eversion    (Blank rows = not tested)  BED MOBILITY:  Mod I per patient report; uses step stool as her bed is high  TRANSFERS: Assistive device utilized: Single point cane  Sit to stand: Modified independence Stand to sit: Modified independence Chair to chair: Modified independence Floor:  not assessed at eval  GAIT: Gait pattern: decreased step length- Right, decreased step length- Left, decreased stride length, decreased hip/knee flexion- Right, decreased hip/knee flexion- Left, decreased ankle dorsiflexion- Right, decreased ankle dorsiflexion- Left, shuffling, trunk flexed, and narrow BOS Distance walked: various clinic distances Assistive device utilized: Single point cane Level of assistance: Modified independence  TREATMENT:   TherAct/Self Care:  Goal check Vitals:   09/25/23 1022 09/25/23 1039  BP: (!) 142/81 (!) 161/98  Pulse: 65 67  Seated on LUE    Seated on RUE forearm due to glucose patch   - Initiated goal check, after testing completed patient reported onset of facial numbness that had not mentioned  previously that started at 6:00pm, brain fog similar to how she felt at last stroke, patient did not have family in area and when called sister unable to get her, PT monitored vitals as noted above, PT recommended patient go to ED as symptomatic, EMS called per patient request, PT stayed with patient and monitored until EMS arrived    [facility] PT Assessment - 09/25/23 0001       Standardized Balance Assessment   Standardized Balance Assessment Timed Up and Go Test;Five Times Sit to Stand;10 meter walk test;Berg Balance Test    Five times sit to stand comments  23.54   seconds without UE use (SBA)     Timed Up and Go Test   Normal TUG (seconds) 25             PATIENT EDUCATION: Education details: did talk to PCP office about AFO order - just waiting on order to be placed, continue HEP Person educated: Patient Education method: Explanation and Demonstration Education comprehension: verbalized understanding, returned demonstration, and needs further education  HOME EXERCISE PROGRAM: Access Code: [email or web address] URL: [email or web address] Date: 09/11/2023 Prepared by: Sherlie Ban  Exercises - Staggered Sit-to-Stand (Mirrored)  - 1-2 x daily - 5 x weekly - 2 sets - 10 reps - Standing March with Counter Support  - 1-2 x daily - 5 x weekly - 2 sets - 10 reps - Heel Toe Raises with Counter Support  - 1-2 x daily - 5 x weekly - 2 sets - 10 reps  GOALS: Goals reviewed with patient? Yes  SHORT TERM GOALS: Target date: 09/27/2023  Pt will be independent with initial HEP for improved strength, balance, transfers and gait. Baseline: reports confidence in HEP Goal status: MET  2.  Pt will improve 5 x STS to less than or equal to 20 seconds to demonstrate improved functional strength and transfer efficiency.  Baseline: 24.22 sec no UE (3/19); 23.54 seconds without UE use Goal status: NOT MET  3.  Pt will improve gait velocity to at least 2.0 ft/sec for improved gait efficiency  and performance at mod I level  Baseline: 1.86 ft/sec with SPC (3/19) Goal status: INITIAL  4.  Pt will improve normal TUG to less than or equal to 24 seconds for improved functional mobility and decreased fall risk. Baseline: 27 sec with SPC (3/19); 25 seconds with SPC Goal status: NOT MET  5.  Pt will improve Berg score to 40/56 for decreased fall risk Baseline: 35/56 (3/19) Goal status: INITIAL  6.  to be assessed and STG set Baseline: 450' with SPC  Goal status: MET  LONG TERM GOALS: Target date: 10/25/2023   Pt will be independent with final HEP for improved strength, balance, transfers and gait. Baseline:  Goal status: INITIAL  2.  Pt will improve 5 x STS to less than or equal to 20 seconds to demonstrate improved functional strength and transfer efficiency.  Baseline: 24.22 sec no UE (3/19); 23.54 seconds without UE use Goal status: REVISED   3.  Pt will improve gait velocity to at least 2.25 ft/sec for improved gait efficiency and performance at mod I level  Baseline: 1.86 ft/sec with SPC (3/19) Goal status: INITIAL  4.  Pt will improve normal TUG to less than or equal to 21 seconds for improved functional mobility and decreased fall risk. Baseline: 27 sec with SPC (3/19) Goal status: INITIAL  5.  Pt will improve Berg score to 45/56 for decreased fall risk Baseline: 35/56 (3/19) Goal status: INITIAL  6.  Pt will ambulate at least 525' with with SPC vs. RW with no seated rest break and reporting RPE as 5/10 or less in order to demo improved gait endurance.  Baseline: 450' with SPC and CGA Goal status: INITIAL  ASSESSMENT:  CLINICAL IMPRESSION: Emphasis on skilled PT session on assessing patient's short term goals; however, session limited by new onset R facial numbness that started at 6:00pm on 4/13 that patient reports halfway through session. Exercise halted at this time and EMS called per patient request after PT advised patient to go to ED. Continue  POC as able.    OBJECTIVE IMPAIRMENTS: Abnormal gait, cardiopulmonary status limiting activity, decreased activity tolerance, decreased balance, decreased endurance, decreased mobility, difficulty walking, decreased ROM, and decreased strength.   ACTIVITY LIMITATIONS: carrying, lifting, bending, squatting, and stairs  PARTICIPATION LIMITATIONS: meal prep, cleaning, laundry, and driving  PERSONAL FACTORS: Time since onset of injury/illness/exacerbation and 3+ comorbidities:    arthritis, asthma, depression, anxiety, GERD, HTN, neuropathy, suicide attempt, DM, CVA with residual L side deficits, rectal cancer, COPD/emphysema, pulmonary fibrosis with baseline dyspnea and chronic coughare also affecting patient's functional outcome.   REHAB POTENTIAL: Fair time since onset, history of multiple CVAs  CLINICAL DECISION MAKING: Evolving/moderate complexity  EVALUATION COMPLEXITY: Moderate  PLAN:  PT FREQUENCY: 2x/week  PT DURATION: 6 weeks  PLANNED INTERVENTIONS: [id number]- PT Re-evaluation, 97110-Therapeutic exercises, 97530- Therapeutic activity, [id number]- Neuromuscular re-education, 97535- Self Care, [id number]- Manual therapy, [phone]- Gait training, [phone]- Orthotic Fit/training, [phone]- Electrical stimulation (manual), Patient/Family education, Balance training, Stair training, Taping, Dry Needling, DME instructions, Cryotherapy, and Moist heat  PLAN FOR NEXT SESSION: cancel some PT visits at end of her schedule because she got scheduled for too many and past her cert date, finish assessing short term goals and follow up on ER visit  assess BP, add to HEP HEP to address balance, strength, and endurance (resisted step taps, step ups, resisted lateral stepping and monster walks, airex, SciFit); LLE NMR; did we get AFO order for L AFO?   Taylor Turkalo, PT, DPT, CSRS 09/25/23 11:14 AM

## 2023-09-25 NOTE — ED Notes (Signed)
 Pt ambulated to restroom from bed with standby assist.

## 2023-09-25 NOTE — ED Triage Notes (Signed)
 Patient from neuro rehab via EMS. States that last night at 1800 she began to feel facial numbness. Went to previously scheduled appointment today and began to have slurred speech and became more lethargic at the appointment so EMS was called.

## 2023-09-25 NOTE — Hospital Course (Addendum)
 Erica [name] is a 68 y.o. female with medical history significant for recurrent stroke with residual left-sided weakness and ILR in place, COPD/IPF, insulin-dependent T2DM, CKD stage IIIb, HTN, HLD who presented with right facial/tongue numbness and worsened slurred speech from baseline and is admitted with an acute lacunar infarct in the left thalamus.

## 2023-09-25 NOTE — ED Notes (Signed)
 Patient transported to MRI

## 2023-09-25 NOTE — Patient Instructions (Signed)
  Memory Strategies  W - Write it down  A - Associate it with something  R - Repeat it  M - Mental Image     Play the memory game  Try to remember 3-5 items on your store list without looking  Study a detailed picture in a magazine for 1 minute, then write down everything you can remember from the picture    - Using bright colored visual aid to remember where things belong

## 2023-09-25 NOTE — Therapy (Signed)
 OUTPATIENT SPEECH LANGUAGE PATHOLOGY EVALUATION   Patient Name: [facility]a [name] MRN: [id number] DOB:December 01, 1955, 68 y.o., female Today's Date: 09/25/2023  PCP: Erica Cisco NP REFERRING PROVIDER: Johny Nap, NP  END OF SESSION:  End of Session - 09/25/23 0934     Visit Number 2    Number of Visits 17    Date for SLP Re-Evaluation 11/08/23    Authorization Type [employer] -auth requested    SLP Start Time [phone]    SLP Stop Time  1015    SLP Time Calculation (min) 41 min    Activity Tolerance Patient tolerated treatment well              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   GERD (gastroesophageal reflux disease)    with chronic cough   Headache(784.0)    "mild"   Hypertension    Loop Biotronik loop implant 12/16/2020 12/16/2020   Mental disorder    depression   Neuropathy    feet    Pain    arthritis pain - takes tramadol as needed   Pulmonary fibrosis, unspecified (HCC)    Rectal cancer (HCC)    Rectal polyp    very little bleeding with bowel movements- no pain   Stroke (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   Suicide attempt [facility])    Past Surgical History:  Procedure Laterality Date   ANAL RECTAL MANOMETRY N/A 07/13/2016   Procedure: ANO RECTAL MANOMETRY;  Surgeon: Erica Nixon, MD;  Location: [facility];  Service: Endoscopy;  Laterality: N/A;   CESAREAN SECTION     EUS N/A 11/21/2012   Procedure: LOWER ENDOSCOPIC ULTRASOUND (EUS);  Surgeon: Erica Hilts, MD;  [name];  Service: Endoscopy;  Laterality: N/A;   FLEXIBLE SIGMOIDOSCOPY N/A 11/21/2012   Procedure: FLEXIBLE SIGMOIDOSCOPY;  Surgeon: Erica Hilts, MD;  Location: [facility];  Service: Endoscopy;  Laterality: N/A;   FLEXIBLE SIGMOIDOSCOPY N/A 01/28/2013   Procedure: FLEXIBLE SIGMOIDOSCOPY;  Surgeon: Erica Nixon,  MD;  Location: [facility];  Service: Endoscopy;  Laterality: N/A;   LAPAROSCOPIC LOW ANTERIOR RESECTION N/A 01/29/2013   Procedure: LAPAROSCOPIC LOW ANTERIOR RESECTION, Rigid Proctoscopy;  Surgeon: Erica Nixon, MD;  Location: [facility];  Service: General;  Laterality: N/A;   LAPAROSCOPIC SIGMOID COLECTOMY N/A 11/14/2012   Procedure: DIAGNOSTIC LAPAROSCOPY AND SIGMOIDMOIDOSCOPY ;  Surgeon: Erica Harry, MD;  Location: [facility];  Service: General;  Laterality: N/A;   RECTAL ULTRASOUND N/A 07/13/2016   Procedure: RECTAL ULTRASOUND;  Surgeon: Erica Nixon, MD;  Location: [facility];  Service: Endoscopy;  Laterality: N/A;   TONSILLECTOMY     TONSILLECTOMY AND ADENOIDECTOMY     Patient Active Problem List   Diagnosis Date Noted   Syncope 03/18/2023   Stroke (cerebrum) (HCC) 03/08/2023   Acute ischemic stroke (HCC) 03/05/2023   Stroke-like symptom 03/04/2023   Research study patient 07/15/2022   Encounter for loop recorder check 01/02/2021   Loop Biotronik loop implant 12/16/2020 12/16/2020   Leukopenia    Controlled type 2 diabetes mellitus with hyperglycemia, with long-term current use of insulin (HCC)    Vascular headache    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   Sinus bradycardia on ECG 11/03/2020   Benign essential HTN    Pancytopenia (HCC)    Stage  3b chronic kidney disease (HCC)    Anemia    Labile blood glucose    Labile blood pressure    Diabetic peripheral neuropathy (HCC)    Essential hypertension    AKI (acute kidney injury) (HCC)    Ataxia, late effect of cerebrovascular disease    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   Acute CVA (cerebrovascular accident) (HCC) 06/26/2020   CVA (cerebral vascular accident) (HCC) 06/25/2020   BMI 32.0-32.9,adult 06/03/2020   Left upper lobe pneumonia 03/30/2020   Chest pain 10/01/2019   MDD (major depressive disorder), recurrent severe, 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   Suicide attempt  (HCC) 05/09/2019   Cocaine abuse (HCC) 05/09/2019   Interstitial lung disease (HCC) 06/21/2016   COPD (chronic obstructive pulmonary disease) (HCC) 06/21/2016   Cough 06/21/2016   GERD (gastroesophageal reflux disease) 06/21/2016   Hypersomnia 06/21/2016   Tobacco user 06/21/2016   Rectal cancer (HCC) 01/30/2013    ONSET DATE: 07/06/23 (referral date)   REFERRING DIAG: I63.9 (ICD-10-CM) - Recurrent strokes (HCC) Y86.578 (ICD-10-CM) - Dysarthria as late effect of stroke  THERAPY DIAG: Cognitive communication deficit  Dysarthria and anarthria  Rationale for Evaluation and Treatment: Rehabilitation  SUBJECTIVE:   SUBJECTIVE STATEMENT: Pt reports receiving additional assistance from family to manage medications and household responsibilities. Pt accompanied by: self  PERTINENT HISTORY: "[name] is a very pleasant 68 yo [place] American female with history of recurrent ischemic stroke.  She had 2 strokes in 2022 (January and May), again in September 2024 and then October 2024."  PAIN: Are you having pain? No  FALLS: Has patient fallen in last 6 months?  No  LIVING ENVIRONMENT: Lives with: lives alone (family provides assistance) Lives in: House/apartment  PLOF:  Level of assistance: Needed assistance with ADLs, Needed assistance with IADLS Employment: Retired  PATIENT GOALS: improve cognition and communication  OBJECTIVE:  Note: Objective measures were completed at Evaluation unless otherwise noted.  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 Family is  managing finances and organizing pill box  AUDITORY COMPREHENSION: Overall auditory comprehension: Appears intact for simple conversation YES/NO questions: Appears intact Following directions: Impaired: moderately complex Conversation: Simple Interfering components: attention, awareness, visual impairments, motor planning, processing speed, and working memory  READING COMPREHENSION: Intact  EXPRESSION: verbal  VERBAL EXPRESSION: Level of generative/spontaneous verbalization: conversation Automatic speech: name: intact and social response: intact  Naming: Confrontation: 76-100% and Divergent: 26-50% Interfering components: attention, premorbid deficit, and speech intelligibility  MOTOR SPEECH: Level of impairment: conversation  Comment: previously received CIR ST for mild dysarthria. No overt dysarthria exhibited today given time constraints but may need formal/additional assessment in upcoming sessions   STANDARDIZED ASSESSMENTS: CLQT: Attention: Mild, Memory: Mild, Executive Function: Severe, Language: Mild, Visuospatial Skills: Moderate, and Clock Drawing: WNL  PATIENT REPORTED OUTCOME MEASURES (PROM): Cognitive Function: 42  TREATMENT DATE:  09-25-23: CLQT and PROM completed today (see scores above). Overall CLQT score was mild (borderline moderate), with notable reduced short term recall, decreased attention, difficulty following multi-step instructions, and limited error awareness. Pt reports her son is currently managing finances, assisting with dog walking and her sister is assisting with medication management. Pt shares difficulty with forgetting items around her home, forgetting names of familiar people, remembering simple instructions and making simple mistakes. SLP initiated pt education of memory strategies with pt endorsing understanding. Hand out provided  to aid carryover. Of note, no overt speech changes appreciated or reported this date. Pt sent to ER d/t facial numbness reported in later PT session.   08-30-23: Eval only  PATIENT EDUCATION: Education details: POC Person educated: Patient Education method: Explanation Education comprehension: verbalized understanding   GOALS: Goals reviewed with patient? Yes  SHORT TERM GOALS: Target date: 09/27/2023   Pt will complete CLQT and PROM in first 1-2 sessions Baseline: Goal status: MET  2.  Pt will utilize trained memory compensations to aid functional recall (I.e., feed dog, take meds, recall grocery lists) x3 opportunities with mod I Baseline:  Goal status: INITIAL  3.  Pt will effectively utilize attention/processing strategies to aid thought/topic maintenance with occasional mod A in therapy  Baseline:  Goal status: INITIAL  4.  Pt will utilize anomia strategies to aid word retrieval during structured conversations with occasional mod-A in therapy  Baseline:  Goal status: INITIAL   LONG TERM GOALS: Target date: 11/08/2023  Pt will carryover cognitive strategies to improve daily functioning and completion of daily tasks given with rare mod-A from family  Baseline:  Goal status: INITIAL  2.  Pt will carryover word retrieval strategies during unstructured conversations x2 with occasional min-A Baseline:  Goal status: INITIAL  3.  Pt will improve PROM score by 2 points by LTG date Baseline:  Goal status: INITIAL    ASSESSMENT:  CLINICAL IMPRESSION: Patient is a 68 y.o. F who was seen today for ST treatment s/p recurrent strokes. Pt reports concerns with frequent word finding episodes, losing train of thought in conversation, and difficulty recalling daily responsibilities, such as feeding the dog, locking the door, forgetting items, and taking medications. Indicated occasional stutter, which was not evidenced today. Pt presents with reduced awareness of deficits and  shares she could manage her finances if she had to. CLQT and PROM completed today. Clinical observations include delayed processing, reduced recall of multi-step instructions, and attention deficits during maze task, design memory, and design generation tasks. Questions presence of language disorder versus cognitive communication impairment given overt deficits related to memory, attention, and executive functioning. Pt would benefit from skilled ST intervention to optimize cognitive linguistic functioning.   OBJECTIVE IMPAIRMENTS: include attention, memory, executive functioning, and expressive language. These impairments are limiting patient from household responsibilities and effectively communicating at home and in community. Factors affecting potential to achieve goals and functional outcome are ability to learn/carryover information, medical prognosis, and previous level of function. Patient will benefit from skilled SLP services to address above impairments and improve overall function.  REHAB POTENTIAL: Good  PLAN:  SLP FREQUENCY: 1-2x/week  SLP DURATION: 10 weeks (extended for scheduling)  PLANNED INTERVENTIONS: Language facilitation, Environmental controls, Cueing hierachy, Cognitive reorganization, Functional tasks, SLP instruction and feedback, Compensatory strategies, Patient/family education, and [id number] Treatment of speech (30 or 45 min)     Gracy Racer, CCC-SLP 09/25/2023, 11:26 AM

## 2023-09-25 NOTE — ED Provider Notes (Signed)
 [place] EMERGENCY DEPARTMENT AT [facility] Provider Note   CSN: [id number] Arrival date & time: 09/25/23  1122  An emergency department physician performed an initial assessment on this suspected stroke patient at 1127.  History  Chief Complaint  Patient presents with   Code Stroke    Erica [name] is a 68 y.o. female.  Patient here as a code stroke.  Recent stroke may be some left-sided symptoms at baseline.  She was at neuro rehab.  She thought maybe some left facial numbness that started 6:00 last night.  Went to her appointment today maybe had some worsening slurred speech more lethargic so EMS was called.  Code stroke upon arrival here.  She denies any chest pain shortness of breath weakness numbness tingling.  History of diabetes hypertension COPD pulmonary fibrosis.  The history is provided by the patient.       Home Medications Prior to Admission medications   Medication Sig Start Date End Date Taking? Authorizing Provider  albuterol (VENTOLIN HFA) 108 (90 Base) MCG/ACT inhaler Inhale 2 puffs into the lungs every 4 (four) hours as needed for wheezing. Patient taking differently: Inhale 2 puffs into the lungs 2 (two) times daily as needed for wheezing. 11/20/20  Yes Angiulli, Everlyn Hockey, PA-C  amLODipine (NORVASC) 10 MG tablet Take 1 tablet (10 mg total) by mouth daily. 03/14/23  Yes Love, Renay Carota, PA-C  cetirizine (ZYRTEC) 10 MG tablet Take 1 tablet (10 mg total) by mouth daily. 06/09/23 09/25/23 Yes Sonnie Dusky, PA-C  clopidogrel (PLAVIX) 75 MG tablet Take 75 mg by mouth daily. 09/04/23  Yes [provider]  diclofenac Sodium (VOLTAREN) 1 % GEL Apply 1 Application topically 2 (two) times daily as needed (pain). 08/20/22  Yes [provider]  DULoxetine (CYMBALTA) 30 MG capsule Take 30 mg by mouth daily. 09/04/23  Yes [provider]  FARXIGA 10 MG TABS tablet Take 10 mg by mouth daily. 05/14/23  Yes [provider]  gabapentin  (NEURONTIN) 100 MG capsule Take 1 capsule (100 mg total) by mouth 2 (two) times daily. Patient is taking as needed Patient taking differently: Take 100 mg by mouth daily as needed (pain). 03/14/23  Yes Love, Renay Carota, PA-C  hydrALAZINE (APRESOLINE) 25 MG tablet SMARTSIG:1.5 Tablet(s) By Mouth Every 8 Hours 09/05/23  Yes [provider]  LANTUS SOLOSTAR 100 UNIT/ML Solostar Pen Inject 30 Units into the skin daily.   Yes [provider]  naphazoline-glycerin (CLEAR EYES REDNESS) 0.012-0.25 % SOLN Place 1-2 drops into both eyes 3 (three) times daily with meals. Patient taking differently: Place 2 drops into both eyes daily as needed for eye irritation. 03/14/23  Yes Love, Renay Carota, PA-C  pantoprazole (PROTONIX) 40 MG tablet Take 40 mg by mouth daily.   Yes [provider]  Pirfenidone 801 MG TABS TAKE 1 TABLET (801 MG) BY MOUTH WITH BREAKFAST, WITH LUNCH, AND WITH EVENING MEAL. Patient taking differently: Take 1 tablet by mouth in the morning and at bedtime. 08/22/22  Yes Maire Scot, MD  rosuvastatin (CRESTOR) 40 MG tablet Take 1 tablet (40 mg total) by mouth daily. 03/14/23 09/25/23 Yes Love, Renay Carota, PA-C  sertraline (ZOLOFT) 50 MG tablet Take 1 tablet (50 mg total) by mouth daily. 03/14/23  Yes Love, Renay Carota, PA-C  triamcinolone cream (KENALOG) 0.1 % Apply 1 Application topically daily.   Yes [provider]  venlafaxine XR (EFFEXOR-XR) 150 MG 24 hr capsule Take 1 capsule (150 mg total) by mouth  daily. 03/14/23  Yes Love, Renay Carota, PA-C  sacubitril-valsartan (ENTRESTO) 49-51 MG Take 1 tablet by mouth 2 (two) times daily. Patient not taking: Reported on 09/25/2023 03/14/23   Zelda Hickman, PA-C  SYMBICORT 160-4.5 MCG/ACT inhaler INHALE 2 PUFFS INTO THE LUNGS TWICE DAILY Patient not taking: Reported on 09/25/2023 10/18/21   Maire Scot, MD  traZODone (DESYREL) 100 MG tablet Take 1 tablet (100 mg total) by mouth at bedtime. Patient not taking: Reported on  09/25/2023 03/14/23   Love, Renay Carota, PA-C  Vitamin D, Ergocalciferol, (DRISDOL) 1.25 MG (50000 UNIT) CAPS capsule Take 50,000 Units by mouth once a week. Patient not taking: Reported on 09/25/2023 05/04/23   [provider]      Allergies    Penicillins    Review of Systems   Review of Systems  Physical Exam Updated Vital Signs BP (!) 144/98   Pulse 68   Temp 97.8 F (36.6 C) (Oral)   Resp 16   SpO2 96%  Physical Exam Vitals and nursing note reviewed.  Constitutional:      General: She is not in acute distress.    Appearance: She is well-developed. She is not ill-appearing.  HENT:     Head: Normocephalic and atraumatic.     Mouth/Throat:     Mouth: Mucous membranes are moist.  Eyes:     Extraocular Movements: Extraocular movements intact.     Conjunctiva/sclera: Conjunctivae normal.     Pupils: Pupils are equal, round, and reactive to light.  Cardiovascular:     Rate and Rhythm: Normal rate and regular rhythm.     Pulses: Normal pulses.     Heart sounds: Normal heart sounds. No murmur heard. Pulmonary:     Effort: Pulmonary effort is normal. No respiratory distress.     Breath sounds: Normal breath sounds.  Abdominal:     Palpations: Abdomen is soft.     Tenderness: There is no abdominal tenderness.  Musculoskeletal:     Cervical back: Neck supple.  Skin:    General: Skin is warm and dry.     Capillary Refill: Capillary refill takes less than 2 seconds.  Neurological:     General: No focal deficit present.     Mental Status: She is alert.     Comments: Little bit slow to respond maybe some left-sided weakness compared to the right, speech appears to be intact maybe a little bit slurred.  No obvious visual field issues     ED Results / Procedures / Treatments   Labs (all labs ordered are listed, but only abnormal results are displayed) Labs Reviewed  CBC - Abnormal; Notable for the following components:      Result Value   RBC 3.85 (*)    HCT 34.2  (*)    RDW 16.3 (*)    All other components within normal limits  I-STAT CHEM 8, ED - Abnormal; Notable for the following components:   Potassium 6.8 (*)    Chloride 112 (*)    BUN 55 (*)    Creatinine, Ser 2.00 (*)    Calcium, Ion 1.05 (*)    Hemoglobin 11.2 (*)    HCT 33.0 (*)    All other components within normal limits  CBG MONITORING, ED - Abnormal; Notable for the following components:   Glucose-Capillary 48 (*)    All other components within normal limits  CBG MONITORING, ED - Abnormal; Notable for the following components:   Glucose-Capillary 115 (*)    All  other components within normal limits  PROTIME-INR  APTT  DIFFERENTIAL  ETHANOL  URINALYSIS, ROUTINE W REFLEX MICROSCOPIC  COMPREHENSIVE METABOLIC PANEL WITH GFR  CBG MONITORING, ED    EKG EKG Interpretation Date/Time:  Monday September 25 2023 12:12:30 EDT Ventricular Rate:  64 PR Interval:  173 QRS Duration:  101 QT Interval:  435 QTC Calculation: 449 R Axis:   -38  Text Interpretation: Sinus rhythm Confirmed by Lowery Rue [phone]) on 09/25/2023 12:17:07 PM  Radiology CT ANGIO HEAD NECK W WO CM (CODE STROKE) Result Date: 09/25/2023 CLINICAL DATA:  Provided history: Neuro deficit, acute, stroke suspected. Additional history obtained from electronic MEDICAL RECORD NUMBERFacial numbness, slurred speech. EXAM: CT ANGIOGRAPHY HEAD AND NECK WITH AND WITHOUT CONTRAST TECHNIQUE: Multidetector CT imaging of the head and neck was performed using the standard protocol during bolus administration of intravenous contrast. Multiplanar CT image reconstructions and MIPs were obtained to evaluate the vascular anatomy. Carotid stenosis measurements (when applicable) are obtained utilizing NASCET criteria, using the distal internal carotid diameter as the denominator. RADIATION DOSE REDUCTION: This exam was performed according to the departmental dose-optimization program which includes automated exposure control, adjustment of the mA  and/or kV according to patient size and/or use of iterative reconstruction technique. CONTRAST:  75mL OMNIPAQUE IOHEXOL 300 MG/ML  SOLN COMPARISON:  Non-contrast head CT performed earlier today 09/25/2023. CT angiogram head/neck 03/18/2023. FINDINGS: CTA NECK FINDINGS Aortic arch: Standard aortic branching. The visualized thoracic aorta is normal in caliber. Streak/beam hardening artifact arising from a dense contrast bolus partially obscures the left subclavian artery. Within this limitation, there is no appreciable hemodynamically significant innominate or proximal subclavian artery stenosis. Right carotid system: CCA and ICA patent within the neck without stenosis or significant atherosclerotic disease. No evidence of dissection. Left carotid system: Beam hardening artifact arising from a dense contrast bolus partially obscures the proximal CCA. Within this limitation, the CCA and ICA are patent within the neck without stenosis or significant atherosclerotic disease. No evidence of dissection. Vertebral arteries: Codominant and patent within the neck without stenosis or significant atherosclerotic disease. No evidence of dissection. Skeleton: Spondylosis at the cervical and visualized upper thoracic levels. No acute fracture or aggressive osseous lesion. Other neck: No neck mass or cervical lymphadenopathy. Upper chest: No consolidation within the imaged lung apices. Centrilobular paraseptal emphysema. Incompletely assessed fibrotic lung disease. Review of the MIP images confirms the above findings CTA HEAD FINDINGS Anterior circulation: The intracranial internal carotid arteries are patent. Atherosclerotic plaque within both vessels with no more than mild stenosis. The M1 middle cerebral arteries are patent. No M2 proximal branch occlusion or high-grade proximal stenosis. The anterior cerebral arteries are patent. No intracranial aneurysm is identified. Posterior circulation: The intracranial vertebral arteries  are patent. The basilar artery is patent. 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 The posterior cerebral arteries are patent. Posterior communicating arteries are diminutive or absent, bilaterally. Venous sinuses: Within the limitations of contrast timing, no convincing thrombus. Anatomic variants: As described. Review of the MIP images confirms the above findings No emergent large vessel occlusion identified. These results were communicated to [facility] at 12:10 pmon 4/14/2025by text page via the [facility] messaging system. IMPRESSION: CTA neck: 1. Artifact arising from a dense contrast bolus partially obscures the proximal left common carotid artery. Within this limitation, the common carotid, internal carotid and vertebral arteries are patent within the neck without stenosis or significant atherosclerotic disease. No evidence of dissection. 2. Incompletely assessed fibrotic lung disease. 3. Emphysema (ICD10-J43.9). CTA  head: 1. No proximal intracranial large vessel occlusion or high-grade proximal arterial stenosis identified. 2. Atherosclerotic plaque within the intracranial internal carotid arteries with no more than mild stenosis. Electronically Signed   By: Bascom Lily D.O.   On: 09/25/2023 12:10   CT HEAD CODE STROKE WO CONTRAST Result Date: 09/25/2023 CLINICAL DATA:  Code stroke. EXAM: CT HEAD WITHOUT CONTRAST TECHNIQUE: Contiguous axial images were obtained from the base of the skull through the vertex without intravenous contrast. RADIATION DOSE REDUCTION: This exam was performed according to the departmental dose-optimization program which includes automated exposure control, adjustment of the mA and/or kV according to patient size and/or use of iterative reconstruction technique. COMPARISON:  06/09/2023 FINDINGS: Brain: Scattered areas of gray-white differentiation loss in the bilateral occipital parietal cortex and in the anterior and superior frontal lobes. Small chronic  infarcts in the bilateral cerebellum. Chronic lacunar infarcts in the bilateral thalamus and caudate. Ischemic gliosis is present in the bilateral cerebral white matter. No evidence of acute infarct, hemorrhage, hydrocephalus, or mass. Vascular: No hyperdense vessel or unexpected calcification. Skull: Normal. Negative for fracture or focal lesion. Sinuses/Orbits: No acute finding. IMPRESSION: 1. No acute finding. 2. Numerous small cortical, cerebellar, and lacunar infarcts as seen on head CT 06/09/2023. Electronically Signed   By: Ronnette Coke M.D.   On: 09/25/2023 11:34    Procedures .Critical Care  Performed by: Lowery Rue, DO Authorized by: Lowery Rue, DO   Critical care provider statement:    Critical care time (minutes):  35   Critical care was necessary to treat or prevent imminent or life-threatening deterioration of the following conditions:  Metabolic crisis   Critical care was time spent personally by me on the following activities:  Blood draw for specimens, development of treatment plan with patient or surrogate, discussions with primary provider, evaluation of patient's response to treatment, examination of patient, obtaining history from patient or surrogate, ordering and review of laboratory studies, ordering and performing treatments and interventions, ordering and review of radiographic studies, pulse oximetry and re-evaluation of patient's condition   Care discussed with: admitting provider       Medications Ordered in ED Medications  sodium chloride flush (NS) 0.9 % injection 3 mL (0 mLs Intravenous Hold 09/25/23 1331)  iohexol (OMNIPAQUE) 300 MG/ML solution 75 mL (75 mLs Intravenous Contrast Given 09/25/23 1141)  sodium chloride 0.9 % bolus 1,000 mL (1,000 mLs Intravenous New Bag/Given 09/25/23 1249)  dextrose 50 % solution 50 mL (50 mLs Intravenous Given 09/25/23 1244)    ED Course/ Medical Decision Making/ A&P                                 Medical Decision  Making Amount and/or Complexity of Data Reviewed Labs: ordered. Radiology: ordered.  Risk Prescription drug management.   Erica [name] is here as a code stroke.  Recent stroke left-sided deficits at baseline.  She has may be questionable left-sided deficits now but appears to be a little bit hypoglycemic in the 80s dropped down to 48.  Improved with D50.  CT scan unremarkable per radiology report.  She is a code stroke upon arrival.  Dr.Bhagat with neurology evaluated her.  Plan is for MRI if MRI is unremarkable is cleared from a neurologic standpoint.  We do think that this likely from a metabolic process from hypoglycemia.  She took her insulin this morning she ate but not sure if maybe she  took too much.  Will continue to have to trend glucose.  She has been able to eat and drink without any issues.  Lab work thus far is unremarkable.  Her i-STAT had a potassium of 6.8 but there was hemolysis.  CMP needed to be resent because of hemolysis as well.  Lab work otherwise showed no significant leukocytosis anemia.  Creatinine at baseline.  EKG shows sinus rhythm.  No ischemic changes.  She is feeling much better after glucose and eating and drinking.  Plan at time of handoff is for MRI monitoring of her blood sugar.  If there is improvement and she is able to maintain her blood sugar can discharge to home with close follow-up with primary care doctor.  Handed off to oncoming ED staff with patient pending workup.  This chart was dictated using voice recognition software.  Despite best efforts to proofread,  errors can occur which can change the documentation meaning.         Final Clinical Impression(s) / ED Diagnoses Final diagnoses:  Hypoglycemia    Rx / DC Orders ED Discharge Orders     None         Lowery Rue, DO 09/25/23 1403

## 2023-09-25 NOTE — ED Notes (Signed)
Pt given meal per MD 

## 2023-09-25 NOTE — ED Notes (Signed)
 Cbg 48 @ this time

## 2023-09-25 NOTE — Code Documentation (Signed)
 Stroke Response Nurse Documentation Code Documentation  Erica [name] is a 68 y.o. female arriving to [facility]  via [facility] on 4/14 with past medical hx of COPD, CVA, CKD, DM. On aspirin 81 mg daily. Code stroke was activated by EMS.   Patient from neuro rehab appointment where she was LKW at 1800 last night and now complaining of facial numbness and lethargy. Per patient she has had facial numbness since 1800 last night and went to her already-scheduled rehab appointment today. Staff state that her level of consciousness decreased during the appointment and the numbness became worse so EMS was called and code stroke was activated.    Stroke team at the bedside on patient arrival. Labs drawn and patient cleared for CT by Dr. Wilene Hang. Patient to CT with team. NIHSS 8, see documentation for details and code stroke times. Patient with decreased LOC, bilateral arm weakness, bilateral leg weakness, and dysarthria  on exam. The following imaging was completed:  CT Head and CTA. Patient is not a candidate for IV Thrombolytic due to LKW 1800 yesterday. Patient is not a candidate for IR due to no LVO on CTA.   Care Plan: q2 NIHSS and vitals x 12 hours, then q4.   Bedside handoff with ED RN Josh.    Marlon Simpson  Stroke Response RN

## 2023-09-26 ENCOUNTER — Telehealth (HOSPITAL_COMMUNITY): Payer: Self-pay | Admitting: Pharmacy Technician

## 2023-09-26 ENCOUNTER — Encounter (HOSPITAL_COMMUNITY): Payer: Self-pay | Admitting: Internal Medicine

## 2023-09-26 ENCOUNTER — Telehealth: Payer: Self-pay | Admitting: Physical Therapy

## 2023-09-26 ENCOUNTER — Observation Stay (HOSPITAL_BASED_OUTPATIENT_CLINIC_OR_DEPARTMENT_OTHER)

## 2023-09-26 ENCOUNTER — Other Ambulatory Visit (HOSPITAL_COMMUNITY): Payer: Self-pay

## 2023-09-26 ENCOUNTER — Encounter: Payer: Self-pay | Admitting: Physical Therapy

## 2023-09-26 DIAGNOSIS — E1165 Type 2 diabetes mellitus with hyperglycemia: Secondary | ICD-10-CM | POA: Diagnosis not present

## 2023-09-26 DIAGNOSIS — I10 Essential (primary) hypertension: Secondary | ICD-10-CM | POA: Diagnosis not present

## 2023-09-26 DIAGNOSIS — E781 Pure hyperglyceridemia: Secondary | ICD-10-CM | POA: Diagnosis present

## 2023-09-26 DIAGNOSIS — G4700 Insomnia, unspecified: Secondary | ICD-10-CM | POA: Diagnosis not present

## 2023-09-26 DIAGNOSIS — I69354 Hemiplegia and hemiparesis following cerebral infarction affecting left non-dominant side: Secondary | ICD-10-CM | POA: Diagnosis not present

## 2023-09-26 DIAGNOSIS — E1122 Type 2 diabetes mellitus with diabetic chronic kidney disease: Secondary | ICD-10-CM | POA: Diagnosis present

## 2023-09-26 DIAGNOSIS — E1149 Type 2 diabetes mellitus with other diabetic neurological complication: Secondary | ICD-10-CM | POA: Diagnosis not present

## 2023-09-26 DIAGNOSIS — R29708 NIHSS score 8: Secondary | ICD-10-CM | POA: Diagnosis present

## 2023-09-26 DIAGNOSIS — R471 Dysarthria and anarthria: Secondary | ICD-10-CM | POA: Diagnosis present

## 2023-09-26 DIAGNOSIS — I69392 Facial weakness following cerebral infarction: Secondary | ICD-10-CM | POA: Diagnosis not present

## 2023-09-26 DIAGNOSIS — F419 Anxiety disorder, unspecified: Secondary | ICD-10-CM | POA: Diagnosis present

## 2023-09-26 DIAGNOSIS — E785 Hyperlipidemia, unspecified: Secondary | ICD-10-CM | POA: Diagnosis not present

## 2023-09-26 DIAGNOSIS — Z8249 Family history of ischemic heart disease and other diseases of the circulatory system: Secondary | ICD-10-CM | POA: Diagnosis not present

## 2023-09-26 DIAGNOSIS — Z66 Do not resuscitate: Secondary | ICD-10-CM | POA: Diagnosis present

## 2023-09-26 DIAGNOSIS — R5381 Other malaise: Secondary | ICD-10-CM | POA: Diagnosis not present

## 2023-09-26 DIAGNOSIS — E118 Type 2 diabetes mellitus with unspecified complications: Secondary | ICD-10-CM | POA: Diagnosis not present

## 2023-09-26 DIAGNOSIS — H6120 Impacted cerumen, unspecified ear: Secondary | ICD-10-CM | POA: Diagnosis not present

## 2023-09-26 DIAGNOSIS — I69328 Other speech and language deficits following cerebral infarction: Secondary | ICD-10-CM | POA: Diagnosis not present

## 2023-09-26 DIAGNOSIS — Z794 Long term (current) use of insulin: Secondary | ICD-10-CM | POA: Diagnosis not present

## 2023-09-26 DIAGNOSIS — I69398 Other sequelae of cerebral infarction: Secondary | ICD-10-CM | POA: Diagnosis not present

## 2023-09-26 DIAGNOSIS — E1142 Type 2 diabetes mellitus with diabetic polyneuropathy: Secondary | ICD-10-CM | POA: Diagnosis present

## 2023-09-26 DIAGNOSIS — I6930 Unspecified sequelae of cerebral infarction: Secondary | ICD-10-CM | POA: Diagnosis not present

## 2023-09-26 DIAGNOSIS — I69391 Dysphagia following cerebral infarction: Secondary | ICD-10-CM | POA: Diagnosis not present

## 2023-09-26 DIAGNOSIS — E1140 Type 2 diabetes mellitus with diabetic neuropathy, unspecified: Secondary | ICD-10-CM | POA: Diagnosis present

## 2023-09-26 DIAGNOSIS — E1169 Type 2 diabetes mellitus with other specified complication: Secondary | ICD-10-CM | POA: Diagnosis present

## 2023-09-26 DIAGNOSIS — I152 Hypertension secondary to endocrine disorders: Secondary | ICD-10-CM | POA: Diagnosis present

## 2023-09-26 DIAGNOSIS — G441 Vascular headache, not elsewhere classified: Secondary | ICD-10-CM | POA: Diagnosis not present

## 2023-09-26 DIAGNOSIS — R29810 Facial weakness: Secondary | ICD-10-CM | POA: Diagnosis present

## 2023-09-26 DIAGNOSIS — E11649 Type 2 diabetes mellitus with hypoglycemia without coma: Secondary | ICD-10-CM | POA: Diagnosis present

## 2023-09-26 DIAGNOSIS — I6389 Other cerebral infarction: Secondary | ICD-10-CM

## 2023-09-26 DIAGNOSIS — N1832 Chronic kidney disease, stage 3b: Secondary | ICD-10-CM | POA: Diagnosis present

## 2023-09-26 DIAGNOSIS — J4489 Other specified chronic obstructive pulmonary disease: Secondary | ICD-10-CM | POA: Diagnosis present

## 2023-09-26 DIAGNOSIS — M19011 Primary osteoarthritis, right shoulder: Secondary | ICD-10-CM | POA: Diagnosis present

## 2023-09-26 DIAGNOSIS — F32A Depression, unspecified: Secondary | ICD-10-CM | POA: Diagnosis present

## 2023-09-26 DIAGNOSIS — E162 Hypoglycemia, unspecified: Secondary | ICD-10-CM | POA: Diagnosis present

## 2023-09-26 DIAGNOSIS — Z79899 Other long term (current) drug therapy: Secondary | ICD-10-CM | POA: Diagnosis not present

## 2023-09-26 DIAGNOSIS — I129 Hypertensive chronic kidney disease with stage 1 through stage 4 chronic kidney disease, or unspecified chronic kidney disease: Secondary | ICD-10-CM | POA: Diagnosis present

## 2023-09-26 DIAGNOSIS — R208 Other disturbances of skin sensation: Secondary | ICD-10-CM | POA: Diagnosis present

## 2023-09-26 DIAGNOSIS — M19012 Primary osteoarthritis, left shoulder: Secondary | ICD-10-CM | POA: Diagnosis present

## 2023-09-26 DIAGNOSIS — R1312 Dysphagia, oropharyngeal phase: Secondary | ICD-10-CM | POA: Diagnosis present

## 2023-09-26 DIAGNOSIS — J84112 Idiopathic pulmonary fibrosis: Secondary | ICD-10-CM | POA: Diagnosis present

## 2023-09-26 DIAGNOSIS — J439 Emphysema, unspecified: Secondary | ICD-10-CM | POA: Diagnosis present

## 2023-09-26 DIAGNOSIS — R4781 Slurred speech: Secondary | ICD-10-CM | POA: Diagnosis present

## 2023-09-26 DIAGNOSIS — K219 Gastro-esophageal reflux disease without esophagitis: Secondary | ICD-10-CM | POA: Diagnosis present

## 2023-09-26 DIAGNOSIS — R278 Other lack of coordination: Secondary | ICD-10-CM | POA: Diagnosis present

## 2023-09-26 DIAGNOSIS — Z87891 Personal history of nicotine dependence: Secondary | ICD-10-CM | POA: Diagnosis not present

## 2023-09-26 DIAGNOSIS — I6381 Other cerebral infarction due to occlusion or stenosis of small artery: Secondary | ICD-10-CM | POA: Diagnosis present

## 2023-09-26 DIAGNOSIS — E1159 Type 2 diabetes mellitus with other circulatory complications: Secondary | ICD-10-CM | POA: Diagnosis present

## 2023-09-26 LAB — CBC
HCT: 33 % — ABNORMAL LOW (ref 36.0–46.0)
Hemoglobin: 11.7 g/dL — ABNORMAL LOW (ref 12.0–15.0)
MCH: 31.5 pg (ref 26.0–34.0)
MCHC: 35.5 g/dL (ref 30.0–36.0)
MCV: 88.7 fL (ref 80.0–100.0)
Platelets: 126 10*3/uL — ABNORMAL LOW (ref 150–400)
RBC: 3.72 MIL/uL — ABNORMAL LOW (ref 3.87–5.11)
RDW: 15.9 % — ABNORMAL HIGH (ref 11.5–15.5)
WBC: 4.5 10*3/uL (ref 4.0–10.5)
nRBC: 0 % (ref 0.0–0.2)

## 2023-09-26 LAB — ECHOCARDIOGRAM COMPLETE
AR max vel: 1.82 cm2
AV Area VTI: 1.89 cm2
AV Area mean vel: 1.81 cm2
AV Mean grad: 4 mmHg
AV Peak grad: 7.4 mmHg
Ao pk vel: 1.36 m/s
Area-P 1/2: 3.17 cm2
Height: 62 in
S' Lateral: 2.5 cm
Weight: 2544 [oz_av]

## 2023-09-26 LAB — LIPID PANEL
Cholesterol: 178 mg/dL (ref 0–200)
HDL: 54 mg/dL (ref >40)
LDL Cholesterol: 78 mg/dL (ref 0–99)
Total CHOL/HDL Ratio: 3.3 ratio
Triglycerides: 231 mg/dL — ABNORMAL HIGH (ref <150)
VLDL: 46 mg/dL — ABNORMAL HIGH (ref 0–40)

## 2023-09-26 LAB — HEMOGLOBIN A1C
Hgb A1c MFr Bld: 5.6 % (ref 4.8–5.6)
Mean Plasma Glucose: 114.02 mg/dL

## 2023-09-26 LAB — GLUCOSE, CAPILLARY
Glucose-Capillary: 127 mg/dL — ABNORMAL HIGH (ref 70–99)
Glucose-Capillary: 206 mg/dL — ABNORMAL HIGH (ref 70–99)

## 2023-09-26 LAB — BASIC METABOLIC PANEL WITH GFR
Anion gap: 7 (ref 5–15)
BUN: 32 mg/dL — ABNORMAL HIGH (ref 8–23)
CO2: 22 mmol/L (ref 22–32)
Calcium: 8.4 mg/dL — ABNORMAL LOW (ref 8.9–10.3)
Chloride: 112 mmol/L — ABNORMAL HIGH (ref 98–111)
Creatinine, Ser: 1.65 mg/dL — ABNORMAL HIGH (ref 0.44–1.00)
GFR, Estimated: 33 mL/min — ABNORMAL LOW (ref >60)
Glucose, Bld: 138 mg/dL — ABNORMAL HIGH (ref 70–99)
Potassium: 4.8 mmol/L (ref 3.5–5.1)
Sodium: 141 mmol/L (ref 135–145)

## 2023-09-26 LAB — CBG MONITORING, ED
Glucose-Capillary: 68 mg/dL — ABNORMAL LOW (ref 70–99)
Glucose-Capillary: 86 mg/dL (ref 70–99)
Glucose-Capillary: 123 mg/dL — ABNORMAL HIGH (ref 70–99)

## 2023-09-26 MED ORDER — PERFLUTREN LIPID MICROSPHERE
1.0000 mL | INTRAVENOUS | Status: AC | PRN
Start: 2023-09-26 — End: 2023-09-26
  Administered 2023-09-26: 3 mL via INTRAVENOUS

## 2023-09-26 MED ORDER — EZETIMIBE 10 MG PO TABS
10.0000 mg | ORAL_TABLET | Freq: Every day | ORAL | Status: DC
Start: 2023-09-27 — End: 2023-09-28
  Administered 2023-09-27 – 2023-09-28 (×2): 10 mg via ORAL
  Filled 2023-09-26 (×2): qty 1

## 2023-09-26 NOTE — Telephone Encounter (Signed)
 Physical therapist called patient and LVM regarding need for new referral to return to PT, SLP, and OT. Discussed would keep appointments after last 2 weeks to see if can get new referral in as all 3 disciples. Patient verbalized understanding and agreement.   Camella Cave, PT, DPT

## 2023-09-26 NOTE — Telephone Encounter (Signed)
 Patient Product/process development scientist completed.    The patient is insured through [employer] [facility]. Patient has [employer], may use a copay card, and/or apply for patient assistance if available.    Ran test claim for Brilinta 90 mg and the current 30 day co-pay is $0.00.   This test claim was processed through [facility]- copay amounts may vary at other pharmacies due to pharmacy/plan contracts, or as the patient moves through the different stages of their insurance plan.     Roland Earl, CPHT Pharmacy Technician III Certified Patient Advocate [facility] Patient Advocate Team Direct Number: [phone]  Fax: [phone]

## 2023-09-26 NOTE — Progress Notes (Signed)
 Inpatient Rehab Admissions Coordinator Note:   Per PT recommendations patient was screened for CIR candidacy by Mickey Alar, PT. At this time, pt appears to be a potential candidate for CIR. I will place an order for rehab consult for full assessment, per our protocol.  Please contact me any with questions.Loye Rumble, PT, DPT [phone] 09/26/23 2:22 PM

## 2023-09-26 NOTE — ED Notes (Signed)
 Patient given orange juice and calling kitchen for breakfast tray.

## 2023-09-26 NOTE — Therapy (Signed)
 PHYSICAL THERAPY DISCHARGE SUMMARY  Visits from Start of Care: 5  Current functional level related to goals / functional outcomes: Unable to assess, new stroke   Remaining deficits: Unable to assess, new stroke   Education / Equipment: Unable to assess, new stroke   Patient agrees to discharge. Patient goals were  unable to be assessed . Patient is being discharged due to a change in medical status.

## 2023-09-26 NOTE — Evaluation (Signed)
 Physical Therapy Evaluation Patient Details Name: Erica [name] MRN: [id number] DOB: 02-09-1956 Today's Date: 09/26/2023  History of Present Illness  Patient is a 68 y/o female admitted 09/25/23 with R facial numbness, slurred speech.  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  She had CVA previously with implantable loop recorder in place.  PMH positive for COPD/IPF, DM, CKD IIIb, HTN, HLD, depression/anxiety.  Clinical Impression  Patient presents with decreased mobility due to generalized weakness (L>R), decreased balance, decreased activity tolerance relating light headedness and headache limiting ambulation in hallway.  Also demonstrates decreased balance and high fall risk.  Patient previously living alone with intermittent help from sister, son and aide that comes 3x/week.  Feel she will benefit from skilled PT in the acute setting and from post-acute inpatient rehab (>3 hours/day) prior to d/c home.  She was actively undergoing outpatient PT/OT/SLP prior to this episode.       If plan is discharge home, recommend the following: A little help with walking and/or transfers;A little help with bathing/dressing/bathroom;Assistance with cooking/housework;Assist for transportation;Help with stairs or ramp for entrance;Direct supervision/assist for medications management   Can travel by private vehicle        Equipment Recommendations None recommended by PT  Recommendations for Other Services  Rehab consult    Functional Status Assessment Patient has had a recent decline in their functional status and demonstrates the ability to make significant improvements in function in a reasonable and predictable amount of time.     Precautions / Restrictions Precautions Precautions: Fall Recall of Precautions/Restrictions: Intact Restrictions Weight Bearing Restrictions Per Provider Order: No      Mobility  Bed Mobility Overal bed mobility: Needs Assistance Bed Mobility: Supine  to Sit, Sit to Supine     Supine to sit: Mod assist, HOB elevated, Used rails Sit to supine: Min assist   General bed mobility comments: assist for lifting trunk and scooting hips, to supine assist to scoot to [facility]    Transfers Overall transfer level: Needs assistance Equipment used: Rolling walker (2 wheels) Transfers: Sit to/from Stand Sit to Stand: Min assist           General transfer comment: up from tall stretcher to RW A for balance    Ambulation/Gait Ambulation/Gait assistance: Min assist Gait Distance (Feet): 40 Feet Assistive device: Rolling walker (2 wheels) Gait Pattern/deviations: Step-to pattern, Step-through pattern, Decreased stride length, Trunk flexed, Decreased dorsiflexion - left, Knee flexed in stance - left       General Gait Details: slow pace, increased L knee flexion in stance with distance, reported feeling light headed, though BP stable from supine to sitting; reported headache that started with ambulation  Stairs            Wheelchair Mobility     Tilt Bed    Modified Rankin (Stroke Patients Only) Modified Rankin (Stroke Patients Only) Pre-Morbid Rankin Score: Moderate disability Modified Rankin: Moderately severe disability     Balance Overall balance assessment: Needs assistance   Sitting balance-Leahy Scale: Fair     Standing balance support: Bilateral upper extremity supported, Reliant on assistive device for balance Standing balance-Leahy Scale: Poor                               Pertinent Vitals/Pain Pain Assessment Pain Assessment: Faces Faces Pain Scale: Hurts little more Pain Location: headache during mobility Pain Descriptors / Indicators: Headache Pain Intervention(s): Monitored during  session, Patient requesting pain meds-RN notified    Home Living Family/patient expects to be discharged to:: Private residence Living Arrangements: Alone Available Help at Discharge: Family;Available  PRN/intermittently Type of Home: Apartment Home Access: Level entry       Home Layout: One level Home Equipment: Cane - single point;Shower Counsellor (2 wheels) Additional Comments: has an aide 3x/week 2 hours    Prior Function Prior Level of Function : Independent/Modified Independent             Mobility Comments: uses cane versus walker depending on the day ADLs Comments: sister and son assist with IADL's, she prepares microwave meals     Extremity/Trunk Assessment   Upper Extremity Assessment Upper Extremity Assessment: Defer to OT evaluation    Lower Extremity Assessment Lower Extremity Assessment: RLE deficits/detail;LLE deficits/detail RLE Deficits / Details: AROM WFL and strength grossly 4/5 LLE Deficits / Details: AROM A for full knee flexion, strength grossly 4-/5, ankle DF 3-/5    Cervical / Trunk Assessment Cervical / Trunk Assessment: Kyphotic  Communication   Communication Communication: No apparent difficulties    Cognition Arousal: Alert Behavior During Therapy: WFL for tasks assessed/performed   PT - Cognitive impairments: No apparent impairments                         Following commands: Intact       Cueing       General Comments General comments (skin integrity, edema, etc.): sister present and supportive, states aide help supposed to increase from 2 hours 3x/week to 4 hours 3x/week    Exercises     Assessment/Plan    PT Assessment Patient needs continued PT services  PT Problem List Decreased activity tolerance;Decreased balance;Decreased strength;Decreased mobility;Decreased coordination       PT Treatment Interventions DME instruction;Therapeutic exercise;Gait training;Balance training;Functional mobility training;Therapeutic activities;Patient/family education;Cognitive remediation    PT Goals (Current goals can be found in the Care Plan section)  Acute Rehab PT Goals Patient Stated Goal: return to  independent PT Goal Formulation: With patient/family Time For Goal Achievement: 10/10/23 Potential to Achieve Goals: Good    Frequency Min 3X/week     Co-evaluation               AM-PAC PT "6 Clicks" Mobility  Outcome Measure Help needed turning from your back to your side while in a flat bed without using bedrails?: A Little Help needed moving from lying on your back to sitting on the side of a flat bed without using bedrails?: A Lot Help needed moving to and from a bed to a chair (including a wheelchair)?: A Little Help needed standing up from a chair using your arms (e.g., wheelchair or bedside chair)?: A Little Help needed to walk in hospital room?: A Little Help needed climbing 3-5 steps with a railing? : Total 6 Click Score: 15    End of Session Equipment Utilized During Treatment: Gait belt Activity Tolerance: Patient limited by fatigue Patient left: in bed;with call bell/phone within reach;with family/visitor present   PT Visit Diagnosis: Other abnormalities of gait and mobility (R26.89)    Time: 8657-8469 PT Time Calculation (min) (ACUTE ONLY): 25 min   Charges:   PT Evaluation $PT Eval Moderate Complexity: 1 Mod PT Treatments $Gait Training: 8-22 mins PT General Charges $$ ACUTE PT VISIT: 1 Visit         Abigail Hoff, PT Acute Rehabilitation Services Office:[phone] 09/26/2023   Marley Simmers 09/26/2023, 2:18  PM

## 2023-09-26 NOTE — Progress Notes (Signed)
 STROKE TEAM PROGRESS NOTE   SUBJECTIVE (INTERVAL HISTORY) Her sister is at the bedside.  Overall her condition is stable.  Patient presented with some right tongue and right jaw numbness and stumbling walking, however now seems improved.   OBJECTIVE Temp:  [97.7 F (36.5 C)-98.3 F (36.8 C)] 98.3 F (36.8 C) (04/15 0725) Pulse Rate:  [60-68] 66 (04/15 0900) Cardiac Rhythm: Normal sinus rhythm (04/15 0730) Resp:  [15-22] 19 (04/15 0900) BP: (116-163)/(73-105) 144/96 (04/15 0900) SpO2:  [96 %-100 %] 100 % (04/15 0900) Weight:  [72.1 kg] 72.1 kg (04/15 0720)  Recent Labs  Lab 09/25/23 1402 09/25/23 1646 09/25/23 2315 09/26/23 0809 09/26/23 0855  GLUCAP 153* 133* 162* 68* 86   Recent Labs  Lab 09/25/23 1130 09/25/23 1330 09/26/23 0340  NA 137 135 141  K 6.8* 5.0 4.8  CL 112* 109 112*  CO2  --  20* 22  GLUCOSE 83 171* 138*  BUN 55* 34* 32*  CREATININE 2.00* 1.62* 1.65*  CALCIUM  --  8.2* 8.4*   Recent Labs  Lab 09/25/23 1330  AST 21  ALT 12  ALKPHOS 89  BILITOT 0.5  PROT 6.2*  ALBUMIN 3.1*   Recent Labs  Lab 09/25/23 1126 09/25/23 1130 09/26/23 0340  WBC 5.0  --  4.5  NEUTROABS 3.5  --   --   HGB 12.0 11.2* 11.7*  HCT 34.2* 33.0* 33.0*  MCV 88.8  --  88.7  PLT 157  --  126*   No results for input(s): "CKTOTAL", "CKMB", "CKMBINDEX", "TROPONINI" in the last 168 hours. Recent Labs    09/25/23 1126  LABPROT 14.4  INR 1.1   Recent Labs    09/25/23 1218  COLORURINE COLORLESS*  LABSPEC 1.012  PHURINE 6.0  GLUCOSEU >=500*  HGBUR NEGATIVE  BILIRUBINUR NEGATIVE  KETONESUR NEGATIVE  PROTEINUR NEGATIVE  NITRITE NEGATIVE  LEUKOCYTESUR NEGATIVE       Component Value Date/Time   CHOL 178 09/26/2023 0341   CHOL 132 03/08/2021 1143   TRIG 231 (H) 09/26/2023 0341   HDL 54 09/26/2023 0341   HDL 76 03/08/2021 1143   CHOLHDL 3.3 09/26/2023 0341   VLDL 46 (H) 09/26/2023 0341   LDLCALC 78 09/26/2023 0341   LDLCALC 31 07/16/2021 0000   Lab Results   Component Value Date   HGBA1C 5.6 09/26/2023      Component Value Date/Time   LABOPIA NONE DETECTED 03/18/2023 1635   LABOPIA NONE DETECTED 06/26/2020 0643   COCAINSCRNUR NONE DETECTED 03/18/2023 1635   LABBENZ NONE DETECTED 03/18/2023 1635   LABBENZ NONE DETECTED 06/26/2020 0643   AMPHETMU NONE DETECTED 03/18/2023 1635   AMPHETMU NONE DETECTED 06/26/2020 0643   THCU NONE DETECTED 03/18/2023 1635   THCU NONE DETECTED 06/26/2020 0643   LABBARB NONE DETECTED 03/18/2023 1635   LABBARB NONE DETECTED 06/26/2020 0643    Recent Labs  Lab 09/25/23 1128  ETH <10    I have personally reviewed the radiological images below and agree with the radiology interpretations.  MR BRAIN WO CONTRAST Result Date: 09/25/2023 CLINICAL DATA:  Neuro deficit, concern for stroke. Left-sided symptoms, left facial numbness, slurred speech. EXAM: MRI HEAD WITHOUT CONTRAST TECHNIQUE: Multiplanar, multiecho pulse sequences of the brain and surrounding structures were obtained without intravenous contrast. COMPARISON:  Same day CT head and CTA head and neck. MRI head 03/18/2023. FINDINGS: Brain: Punctate focus of restricted diffusion involving the left thalamus partially extending into the cerebral peduncle compatible with acute lacunar infarct. No additional areas of acute  infarct pre shaded. Focal encephalomalacia in the right cerebral hemisphere compatible with remote infarcts. Mild associated susceptibility suggestive of prior hemorrhage. Additional small focus of susceptibility in the left middle frontal gyrus with encephalomalacia and gliosis compatible with remote infarct and remote hemorrhage. Additional remote infarcts in the right frontal lobe, bilateral occipital lobes, small remote infarcts in the left cerebellum, and remote lacunar infarcts in the bilateral basal ganglia and bilateral thalami. Scattered and confluent FLAIR signal abnormality in the periventricular and subcortical white matter suggestive of  moderate chronic microvascular ischemic changes. No midline shift. The basilar cisterns are patent. No extra-axial fluid collections. Ventricles: Prominence of the lateral ventricles suggestive of underlying parenchymal volume loss. Vascular: Skull base flow voids are visualized. Skull and upper cervical spine: No focal abnormality. Sinuses/Orbits: Orbits are symmetric. Paranasal sinuses are clear. Other: Mastoid air cells are clear. IMPRESSION: Acute lacunar infarct in the left thalamus. Multiple additional remote infarcts as above, similar to 2024. Moderate chronic microvascular ischemic changes and mild parenchymal volume loss. These results were communicated to [facility] at 5:48 pm on 09/25/2023 by text page via the [facility] messaging system. Electronically Signed   By: Denny Flack M.D.   On: 09/25/2023 17:49   CT ANGIO HEAD NECK W WO CM (CODE STROKE) Result Date: 09/25/2023 CLINICAL DATA:  Provided history: Neuro deficit, acute, stroke suspected. Additional history obtained from electronic MEDICAL RECORD NUMBERFacial numbness, slurred speech. EXAM: CT ANGIOGRAPHY HEAD AND NECK WITH AND WITHOUT CONTRAST TECHNIQUE: Multidetector CT imaging of the head and neck was performed using the standard protocol during bolus administration of intravenous contrast. Multiplanar CT image reconstructions and MIPs were obtained to evaluate the vascular anatomy. Carotid stenosis measurements (when applicable) are obtained utilizing NASCET criteria, using the distal internal carotid diameter as the denominator. RADIATION DOSE REDUCTION: This exam was performed according to the departmental dose-optimization program which includes automated exposure control, adjustment of the mA and/or kV according to patient size and/or use of iterative reconstruction technique. CONTRAST:  75mL OMNIPAQUE IOHEXOL 300 MG/ML  SOLN COMPARISON:  Non-contrast head CT performed earlier today 09/25/2023. CT angiogram head/neck 03/18/2023. FINDINGS: CTA  NECK FINDINGS Aortic arch: Standard aortic branching. The visualized thoracic aorta is normal in caliber. Streak/beam hardening artifact arising from a dense contrast bolus partially obscures the left subclavian artery. Within this limitation, there is no appreciable hemodynamically significant innominate or proximal subclavian artery stenosis. Right carotid system: CCA and ICA patent within the neck without stenosis or significant atherosclerotic disease. No evidence of dissection. Left carotid system: Beam hardening artifact arising from a dense contrast bolus partially obscures the proximal CCA. Within this limitation, the CCA and ICA are patent within the neck without stenosis or significant atherosclerotic disease. No evidence of dissection. Vertebral arteries: Codominant and patent within the neck without stenosis or significant atherosclerotic disease. No evidence of dissection. Skeleton: Spondylosis at the cervical and visualized upper thoracic levels. No acute fracture or aggressive osseous lesion. Other neck: No neck mass or cervical lymphadenopathy. Upper chest: No consolidation within the imaged lung apices. Centrilobular paraseptal emphysema. Incompletely assessed fibrotic lung disease. Review of the MIP images confirms the above findings CTA HEAD FINDINGS Anterior circulation: The intracranial internal carotid arteries are patent. Atherosclerotic plaque within both vessels with no more than mild stenosis. The M1 middle cerebral arteries are patent. No M2 proximal branch occlusion or high-grade proximal stenosis. The anterior cerebral arteries are patent. No intracranial aneurysm is identified. Posterior circulation: The intracranial vertebral arteries are patent. The basilar artery is  patent. 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 The posterior cerebral arteries are patent. Posterior communicating arteries are diminutive or absent, bilaterally. Venous sinuses: Within the  limitations of contrast timing, no convincing thrombus. Anatomic variants: As described. Review of the MIP images confirms the above findings No emergent large vessel occlusion identified. These results were communicated to [facility] at 12:10 pmon 4/14/2025by text page via the [facility] messaging system. IMPRESSION: CTA neck: 1. Artifact arising from a dense contrast bolus partially obscures the proximal left common carotid artery. Within this limitation, the common carotid, internal carotid and vertebral arteries are patent within the neck without stenosis or significant atherosclerotic disease. No evidence of dissection. 2. Incompletely assessed fibrotic lung disease. 3. Emphysema (ICD10-J43.9). CTA head: 1. No proximal intracranial large vessel occlusion or high-grade proximal arterial stenosis identified. 2. Atherosclerotic plaque within the intracranial internal carotid arteries with no more than mild stenosis. Electronically Signed   By: Bascom Lily D.O.   On: 09/25/2023 12:10   CT HEAD CODE STROKE WO CONTRAST Result Date: 09/25/2023 CLINICAL DATA:  Code stroke. EXAM: CT HEAD WITHOUT CONTRAST TECHNIQUE: Contiguous axial images were obtained from the base of the skull through the vertex without intravenous contrast. RADIATION DOSE REDUCTION: This exam was performed according to the departmental dose-optimization program which includes automated exposure control, adjustment of the mA and/or kV according to patient size and/or use of iterative reconstruction technique. COMPARISON:  06/09/2023 FINDINGS: Brain: Scattered areas of gray-white differentiation loss in the bilateral occipital parietal cortex and in the anterior and superior frontal lobes. Small chronic infarcts in the bilateral cerebellum. Chronic lacunar infarcts in the bilateral thalamus and caudate. Ischemic gliosis is present in the bilateral cerebral white matter. No evidence of acute infarct, hemorrhage, hydrocephalus, or mass. Vascular: No  hyperdense vessel or unexpected calcification. Skull: Normal. Negative for fracture or focal lesion. Sinuses/Orbits: No acute finding. IMPRESSION: 1. No acute finding. 2. Numerous small cortical, cerebellar, and lacunar infarcts as seen on head CT 06/09/2023. Electronically Signed   By: Ronnette Coke M.D.   On: 09/25/2023 11:34   CUP PACEART REMOTE DEVICE CHECK Result Date: 09/14/2023 ILR summary report received. Battery status OK. Normal device function. No new symptom, tachy, brady, or pause episodes. No new AF episodes.  Recordings list: 1 Asystole on 09/05/2023, no EGMs, sent to traige    Monthly summary reports and ROV/PRN ML, CVRS    PHYSICAL EXAM  Temp:  [97.7 F (36.5 C)-98.3 F (36.8 C)] 98.3 F (36.8 C) (04/15 0725) Pulse Rate:  [60-68] 66 (04/15 0900) Resp:  [15-22] 19 (04/15 0900) BP: (116-163)/(73-105) 144/96 (04/15 0900) SpO2:  [96 %-100 %] 100 % (04/15 0900) Weight:  [72.1 kg] 72.1 kg (04/15 0720)  General - Well nourished, well developed, in no apparent distress.  Ophthalmologic - fundi not visualized due to noncooperation.  Cardiovascular - Regular rhythm and rate.  Neuro - awake, alert, eyes open, orientated to age, place, time. No aphasia, but slow talking, following all simple commands. Able to name and repeat. No gaze palsy, tracking bilaterally, visual field full. No facial droop. Tongue midline. RUE and RLE 4/5 and LUE and LLE 4-/5. Sensation mildlly decreased on the left, left FTN and HTS mild dysmetria, gait not tested.     ASSESSMENT/PLAN Erica [name] is a 68 y.o. female with history of hypertension, diabetes, COPD, recurrent strokes, rectal cancer admitted for right tongue and right groin numbness, difficulty walking, altered mental status. No TNK given due to outside window.  Stroke:  left thalamic infarct likely secondary to small vessel disease source CT no acute abnormality CT head and neck unremarkable MRI small left thalamic infarct 2D Echo  EF 55 to 60% LDL 78 HgbA1c 5.6 P2 Y12 pending SCDs for VTE prophylaxis aspirin 81 mg daily prior to admission, now on aspirin 81 mg daily and clopidogrel 75 mg daily DAPT.  If not responding to Plavix, will switch to Brilinta Ongoing aggressive stroke risk factor management Therapy recommendations: CIR Disposition: Pending  History of stroke 06/2020 admitted for right caudate head and right lateral thalamus infarcts.  MRI and carotid Doppler negative.  EF 55 to 60%.  LDL 86, A1c 5.2.  Recommend 30-day CardioNet monitor.  Discharged on DAPT and Lipitor 40. 10/2020 admitted for right small parietal occipital infarct.  LDL 56, A1c 5.2.  Loop recorder placed.  Discharged on DAPT. 02/2023 admitted for bilateral CR small infarcts.  Loop recorder interrogation no in A-fib.  Discharged on DAPT 03/2023 admitted for syncope and found to have right-sided weakness.  MRI showed left thalamic infarct.  Discharged on DAPT and Crestor 06/2023 hypercoag workup negative with oncology Loop recorder so far no A-fib.  Diabetes HgbA1c 5.6 goal < 7.0 Controlled CBG monitoring SSI DM education and close PCP follow up  Hypertension Stable Avoid low BP Long term BP goal normotensive  Hyperlipidemia Home meds: Crestor 40 LDL 78, goal < 70 Now on Crestor 40 Add Zetia Continue statin and Zetia at discharge  Other Stroke Risk Factors Advanced age  Other Active Problems COPD Depression on Zoloft and Effexor CKD 3B, creatinine 1.62--1.65  Hospital day # 0    Erica Denmark, MD PhD Stroke Neurology 09/26/2023 11:04 AM    To contact Stroke Continuity provider, please refer to [email or web address]. After hours, contact General Neurology

## 2023-09-26 NOTE — Progress Notes (Signed)
 Inpatient Rehab Admissions Coordinator:    I met with pt. To discuss potential CIR admit. Pt. Is interested, her sister can provide 24/7 support at d/c. I will have rehab MD see for consult in the morning and send case to insurance once that is complete.   Wandalee Gust, MS, CCC-SLP Rehab Admissions Coordinator  [phone] (celll) [phone] (office)

## 2023-09-26 NOTE — Progress Notes (Signed)
 Erica NOTE    [name]  ZOX:[id number] DOB: [birth year] DOA: 09/25/2023 PCP: Estevan Oaks, NP   Brief Narrative:  Erica [name] is a 68 y.o. 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  Assessment & Plan:   Principal Problem:   Acute lacunar infarction in the left thalamus [facility]) Active Problems:   History of CVA with residual deficit   Type 2 diabetes mellitus with hypoglycemia, with long-term current use of insulin (HCC)   Hypertension associated with diabetes (HCC)   COPD (chronic obstructive pulmonary disease) (HCC)   Idiopathic pulmonary fibrosis (HCC)   Chronic kidney disease, stage 3b (HCC)   Hyperlipidemia associated with type 2 diabetes mellitus (HCC)   Acute lacunar infarct of left thalamus History of recurrent stroke: - Right facial droop and tongue numbness consistent with MRI brain confirming acute lacunar infarct of the left thalamus - Neurology following - CTA head and neck negative for large vessel disease or high-grade stenosis - Echo EF 55 to 60% with notable left ventricle wall motion abnormalities left ventricular hypertrophy and grade 1 diastolic dysfunction without notable shunt - A1c within normal limits, lipid panel remarkable for elevated triglycerides and VLDL -Continue telemetry per protocol - Continue aspirin, Plavix -discussed with neurology contemplating Brilinta although medication compliance is suspect - PT OT speech following per protocol   Insulin-dependent type 2 diabetes uncontrolled with hypoglycemia: -A1c low, concerning for tight control - Notably hypoglycemic in the ED at intake requiring D50 - Continue sliding scale insulin hypoglycemia protocol, home standing insulin  held would likely benefit from evaluation of home insulin regimen given A1c 5.6  Hypertension: - Permissive hypertension window over, resume home regimen as appropriate (currently well-controlled)   COPD/idiopathic pulmonary fibrosis: Stable.  Continue pirfenidone, Symbicort, and albuterol as needed.   CKD stage IIIb: Renal function at baseline   Hyperlipidemia: Continue rosuvastatin.   Depression/anxiety: Continue Zoloft and Effexor XR.  DVT prophylaxis: heparin injection 5,000 Units Start: 09/25/23 2200 Code Status:   Code Status: Do not attempt resuscitation (DNR) PRE-ARREST INTERVENTIONS DESIRED Family Communication: Sister at bedside  Status is: Inpatient  Dispo: The patient is from: Home              Anticipated d/c is to: To be determined              Anticipated d/c date is: 24 to 48 hours              Patient currently not medically stable for discharge given ongoing evaluation with PT OT speech and neurology  Consultants:  Neurology  Procedures:  None planned  Antimicrobials:  None indicated  Subjective: No acute issues or events overnight  Objective: Vitals:   09/25/23 1957 09/25/23 2130 09/26/23 0336 09/26/23 0500  BP:  (!) 150/82 (!) 143/79 (!) 152/86  Pulse:  68 61 63  Resp:  18 16 20   Temp: 97.7 F (36.5 C)  97.9 F (36.6 C)   TempSrc: Oral  Oral   SpO2:  100% 100% 100%    Intake/Output Summary (Last 24 hours) at 09/26/2023 0719 Last data filed at 09/25/2023 1443 Gross per 24 hour  Intake 1000 ml  Output --  Net 1000 ml   There were no vitals filed for this visit.  Examination:  General:  Pleasantly resting in bed, No acute distress. HEENT:  Normocephalic atraumatic.  Sclerae nonicteric, noninjected.  Extraocular movements intact bilaterally. Neck:  Without mass or deformity.  Trachea is midline. Lungs:  Clear to auscultate bilaterally without rhonchi, wheeze, or rales. Heart:  Regular rate and rhythm.  Without murmurs, rubs, or  gallops. Abdomen:  Soft, nontender, nondistended.  Without guarding or rebound. Extremities: Without cyanosis, clubbing, edema, or obvious deformity.  Notable left foot drop/inability to plantarflex at the ankle Skin:  Warm and dry, no erythema.  Data Reviewed: I have personally reviewed following labs and imaging studies  CBC: Recent Labs  Lab 09/25/23 1126 09/25/23 1130 09/26/23 0340  WBC 5.0  --  4.5  NEUTROABS 3.5  --   --   HGB 12.0 11.2* 11.7*  HCT 34.2* 33.0* 33.0*  MCV 88.8  --  88.7  PLT 157  --  126*   Basic Metabolic Panel: Recent Labs  Lab 09/25/23 1130 09/25/23 1330 09/26/23 0340  NA 137 135 141  K 6.8* 5.0 4.8  CL 112* 109 112*  CO2  --  20* 22  GLUCOSE 83 171* 138*  BUN 55* 34* 32*  CREATININE 2.00* 1.62* 1.65*  CALCIUM  --  8.2* 8.4*   GFR: CrCl cannot be calculated (Unknown ideal weight.).  Liver Function Tests: Recent Labs  Lab 09/25/23 1330  AST 21  ALT 12  ALKPHOS 89  BILITOT 0.5  PROT 6.2*  ALBUMIN 3.1*   Coagulation Profile: Recent Labs  Lab 09/25/23 1126  INR 1.1   HbA1C: Recent Labs    09/26/23 0340  HGBA1C 5.6   CBG: Recent Labs  Lab 09/25/23 1236 09/25/23 1317 09/25/23 1402 09/25/23 1646 09/25/23 2315  GLUCAP 48* 115* 153* 133* 162*   Lipid Profile: Recent Labs    09/26/23 0341  CHOL 178  HDL 54  LDLCALC 78  TRIG 231*  CHOLHDL 3.3   No results found for this or any previous visit (from the past 240 hours).   Radiology Studies: MR BRAIN WO CONTRAST Result Date: 09/25/2023 CLINICAL DATA:  Neuro deficit, concern for stroke. Left-sided symptoms, left facial numbness, slurred speech. EXAM: MRI HEAD WITHOUT CONTRAST TECHNIQUE: Multiplanar, multiecho pulse sequences of the brain and surrounding structures were obtained without intravenous contrast. COMPARISON:  Same day CT head and CTA head and neck. MRI head 03/18/2023. FINDINGS: Brain: Punctate focus of restricted diffusion involving the left thalamus partially  extending into the cerebral peduncle compatible with acute lacunar infarct. No additional areas of acute infarct pre shaded. Focal encephalomalacia in the right cerebral hemisphere compatible with remote infarcts. Mild associated susceptibility suggestive of prior hemorrhage. Additional small focus of susceptibility in the left middle frontal gyrus with encephalomalacia and gliosis compatible with remote infarct and remote hemorrhage. Additional remote infarcts in the right frontal lobe, bilateral occipital lobes, small remote infarcts in the left cerebellum, and remote lacunar infarcts in the bilateral basal ganglia and bilateral thalami. Scattered and confluent FLAIR signal abnormality in the periventricular and subcortical white matter suggestive of moderate chronic microvascular ischemic changes. No midline shift. The basilar cisterns are patent. No extra-axial fluid collections. Ventricles: Prominence of the lateral ventricles suggestive of underlying parenchymal volume loss. Vascular: Skull base flow voids are visualized. Skull and upper cervical spine: No focal abnormality. Sinuses/Orbits: Orbits are symmetric. Paranasal sinuses are clear. Other: Mastoid air cells are clear. IMPRESSION: Acute lacunar infarct in the left thalamus. Multiple additional remote infarcts as above, similar to 2024. Moderate chronic microvascular ischemic changes and  mild parenchymal volume loss. These results were communicated to [facility] at 5:48 pm on 09/25/2023 by text page via the [facility] messaging system. Electronically Signed   By: Denny Flack M.D.   On: 09/25/2023 17:49   CT ANGIO HEAD NECK W WO CM (CODE STROKE) Result Date: 09/25/2023 CLINICAL DATA:  Provided history: Neuro deficit, acute, stroke suspected. Additional history obtained from electronic MEDICAL RECORD NUMBERFacial numbness, slurred speech. EXAM: CT ANGIOGRAPHY HEAD AND NECK WITH AND WITHOUT CONTRAST TECHNIQUE: Multidetector CT imaging of the head and neck was  performed using the standard protocol during bolus administration of intravenous contrast. Multiplanar CT image reconstructions and MIPs were obtained to evaluate the vascular anatomy. Carotid stenosis measurements (when applicable) are obtained utilizing NASCET criteria, using the distal internal carotid diameter as the denominator. RADIATION DOSE REDUCTION: This exam was performed according to the departmental dose-optimization program which includes automated exposure control, adjustment of the mA and/or kV according to patient size and/or use of iterative reconstruction technique. CONTRAST:  75mL OMNIPAQUE IOHEXOL 300 MG/ML  SOLN COMPARISON:  Non-contrast head CT performed earlier today 09/25/2023. CT angiogram head/neck 03/18/2023. FINDINGS: CTA NECK FINDINGS Aortic arch: Standard aortic branching. The visualized thoracic aorta is normal in caliber. Streak/beam hardening artifact arising from a dense contrast bolus partially obscures the left subclavian artery. Within this limitation, there is no appreciable hemodynamically significant innominate or proximal subclavian artery stenosis. Right carotid system: CCA and ICA patent within the neck without stenosis or significant atherosclerotic disease. No evidence of dissection. Left carotid system: Beam hardening artifact arising from a dense contrast bolus partially obscures the proximal CCA. Within this limitation, the CCA and ICA are patent within the neck without stenosis or significant atherosclerotic disease. No evidence of dissection. Vertebral arteries: Codominant and patent within the neck without stenosis or significant atherosclerotic disease. No evidence of dissection. Skeleton: Spondylosis at the cervical and visualized upper thoracic levels. No acute fracture or aggressive osseous lesion. Other neck: No neck mass or cervical lymphadenopathy. Upper chest: No consolidation within the imaged lung apices. Centrilobular paraseptal emphysema. Incompletely  assessed fibrotic lung disease. Review of the MIP images confirms the above findings CTA HEAD FINDINGS Anterior circulation: The intracranial internal carotid arteries are patent. Atherosclerotic plaque within both vessels with no more than mild stenosis. The M1 middle cerebral arteries are patent. No M2 proximal branch occlusion or high-grade proximal stenosis. The anterior cerebral arteries are patent. No intracranial aneurysm is identified. Posterior circulation: The intracranial vertebral arteries are patent. The basilar artery is patent. 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 The posterior cerebral arteries are patent. Posterior communicating arteries are diminutive or absent, bilaterally. Venous sinuses: Within the limitations of contrast timing, no convincing thrombus. Anatomic variants: As described. Review of the MIP images confirms the above findings No emergent large vessel occlusion identified. These results were communicated to [facility] at 12:10 pmon 4/14/2025by text page via the [facility] messaging system. IMPRESSION: CTA neck: 1. Artifact arising from a dense contrast bolus partially obscures the proximal left common carotid artery. Within this limitation, the common carotid, internal carotid and vertebral arteries are patent within the neck without stenosis or significant atherosclerotic disease. No evidence of dissection. 2. Incompletely assessed fibrotic lung disease. 3. Emphysema (ICD10-J43.9). CTA head: 1. No proximal intracranial large vessel occlusion or high-grade proximal arterial stenosis identified. 2. Atherosclerotic plaque within the intracranial internal carotid arteries with no more than mild stenosis. Electronically Signed   By: Bascom Lily D.O.   On: 09/25/2023 12:10  CT HEAD CODE STROKE WO CONTRAST Result Date: 09/25/2023 CLINICAL DATA:  Code stroke. EXAM: CT HEAD WITHOUT CONTRAST TECHNIQUE: Contiguous axial images were obtained from the base of the  skull through the vertex without intravenous contrast. RADIATION DOSE REDUCTION: This exam was performed according to the departmental dose-optimization program which includes automated exposure control, adjustment of the mA and/or kV according to patient size and/or use of iterative reconstruction technique. COMPARISON:  06/09/2023 FINDINGS: Brain: Scattered areas of gray-white differentiation loss in the bilateral occipital parietal cortex and in the anterior and superior frontal lobes. Small chronic infarcts in the bilateral cerebellum. Chronic lacunar infarcts in the bilateral thalamus and caudate. Ischemic gliosis is present in the bilateral cerebral white matter. No evidence of acute infarct, hemorrhage, hydrocephalus, or mass. Vascular: No hyperdense vessel or unexpected calcification. Skull: Normal. Negative for fracture or focal lesion. Sinuses/Orbits: No acute finding. IMPRESSION: 1. No acute finding. 2. Numerous small cortical, cerebellar, and lacunar infarcts as seen on head CT 06/09/2023. Electronically Signed   By: Ronnette Coke M.D.   On: 09/25/2023 11:34        Scheduled Meds:   stroke: early stages of recovery book   Does not apply Once   aspirin EC  81 mg Oral Daily   clopidogrel  75 mg Oral Daily   heparin  5,000 Units Subcutaneous Q8H   insulin aspart  0-9 Units Subcutaneous TID WC   mometasone-formoterol  2 puff Inhalation BID   pantoprazole  40 mg Oral Daily   Pirfenidone  1 tablet Oral TID WC   rosuvastatin  40 mg Oral Daily   sertraline  50 mg Oral Daily   sodium chloride flush  3 mL Intravenous Once   venlafaxine XR  150 mg Oral Daily   Continuous Infusions:   LOS: 0 days   Time spent:  Haydee Lipa, DO [facility]  If 7PM-7AM, please contact night-coverage [email or web address]  09/26/2023, 7:19 AM

## 2023-09-26 NOTE — Evaluation (Signed)
 Speech Language Pathology Evaluation Patient Details Name: Erica [name] MRN: [id number] DOB: June 18, 1955 Today's Date: 09/26/2023 Time: 2725-3664 SLP Time Calculation (min) (ACUTE ONLY): 12 min  Problem List:  Patient Active Problem List   Diagnosis Date Noted   Acute lacunar infarction in the left thalamus (HCC) 09/25/2023   History of CVA with residual deficit 09/25/2023   Type 2 diabetes mellitus with hypoglycemia, with long-term current use of insulin (HCC) 09/25/2023   Idiopathic pulmonary fibrosis (HCC) 09/25/2023   Hyperlipidemia associated with type 2 diabetes mellitus (HCC) 09/25/2023   Syncope 03/18/2023   Stroke (cerebrum) (HCC) 03/08/2023   Acute ischemic stroke (HCC) 03/05/2023   Stroke-like symptom 03/04/2023   Research study patient 07/15/2022   Encounter for loop recorder check 01/02/2021   Loop Biotronik loop implant 12/16/2020 12/16/2020   Leukopenia    Controlled type 2 diabetes mellitus with hyperglycemia, with long-term current use of insulin (HCC)    Vascular headache    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   Sinus bradycardia on ECG 11/03/2020   Benign essential HTN    Pancytopenia (HCC)    Chronic kidney disease, stage 3b (HCC)    Anemia    Labile blood glucose    Labile blood pressure    Diabetic peripheral neuropathy (HCC)    Hypertension associated with diabetes (HCC)    AKI (acute kidney injury) (HCC)    Ataxia, late effect of cerebrovascular disease    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   Acute CVA (cerebrovascular accident) (HCC) 06/26/2020   CVA (cerebral vascular accident) (HCC) 06/25/2020   BMI 32.0-32.9,adult 06/03/2020   Left upper lobe pneumonia 03/30/2020   Chest pain 10/01/2019   MDD (major depressive disorder), recurrent severe, 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   Suicide attempt (HCC) 05/09/2019   Cocaine abuse (HCC) 05/09/2019   Interstitial lung disease (HCC) 06/21/2016   COPD  (chronic obstructive pulmonary disease) (HCC) 06/21/2016   Cough 06/21/2016   GERD (gastroesophageal reflux disease) 06/21/2016   Hypersomnia 06/21/2016   Tobacco user 06/21/2016   Rectal cancer (HCC) 01/30/2013   Past Medical History:  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   GERD (gastroesophageal reflux disease)    with chronic cough   Headache(784.0)    "mild"   Hypertension    Loop Biotronik loop implant 12/16/2020 12/16/2020   Mental disorder    depression   Neuropathy    feet    Pain    arthritis pain - takes tramadol as needed   Pulmonary fibrosis, unspecified (HCC)    Rectal cancer (HCC)    Rectal polyp    very little bleeding with bowel movements- no pain   Stroke (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   Suicide attempt [facility])    Past Surgical History:  Past Surgical History:  Procedure Laterality Date   ANAL RECTAL MANOMETRY N/A 07/13/2016   Procedure: ANO RECTAL MANOMETRY;  Surgeon: Joyce Nixon, MD;  Location: [facility] ENDOSCOPY;  Service: Endoscopy;  Laterality: N/A;   CESAREAN SECTION     EUS N/A 11/21/2012   Procedure: LOWER ENDOSCOPIC ULTRASOUND (EUS);  Surgeon: Evangeline Hilts, MD;  Location: [facility];  Service: Endoscopy;  Laterality: N/A;   FLEXIBLE SIGMOIDOSCOPY N/A 11/21/2012   Procedure: FLEXIBLE SIGMOIDOSCOPY;  Surgeon: Evangeline Hilts, MD;  Location: [facility]  ENDOSCOPY;  Service: Endoscopy;  Laterality: N/A;   FLEXIBLE SIGMOIDOSCOPY N/A 01/28/2013   Procedure: FLEXIBLE SIGMOIDOSCOPY;  Surgeon: Joyce Nixon, MD;  Location: [facility] ENDOSCOPY;  Service: Endoscopy;  Laterality: N/A;   LAPAROSCOPIC LOW ANTERIOR RESECTION N/A 01/29/2013   Procedure: LAPAROSCOPIC LOW ANTERIOR RESECTION, Rigid Proctoscopy;  Surgeon: Joyce Nixon, MD;  Location: [facility] ORS;  Service: General;  Laterality: N/A;   LAPAROSCOPIC SIGMOID COLECTOMY  N/A 11/14/2012   Procedure: DIAGNOSTIC LAPAROSCOPY AND SIGMOIDMOIDOSCOPY ;  Surgeon: Enid Harry, MD;  Location: [facility] ORS;  Service: General;  Laterality: N/A;   RECTAL ULTRASOUND N/A 07/13/2016   Procedure: RECTAL ULTRASOUND;  Surgeon: Joyce Nixon, MD;  Location: [facility] ENDOSCOPY;  Service: Endoscopy;  Laterality: N/A;   TONSILLECTOMY     TONSILLECTOMY AND ADENOIDECTOMY     HPI:  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 CT head without contrast negative for acute finding. MRI revealed acute lacunar infarct in the left thalamus.   Assessment / Plan / Recommendation Clinical Impression  Patient presents with acute on chronic cognitive and motor speech deficits, originally acquired from previous stroke events and now exacerbated by new stroke. Patient reports that she has noticed changes in her speech and memory compared to yesterday PTA though endorses that motor speech is almost back to baseline. SLP administered SLUMS examination noting deficits in the areas of immediate recall, delayed recall, working, memory, sustained attention, and problem solving. Patient would benefit from continued ST services during admission to target acute cognitive changes as well as continued OTPT speech therapy to target chronic deficits upon d/c.    SLP Assessment  SLP Recommendation/Assessment: Patient needs continued Speech Lanaguage Pathology Services SLP Visit Diagnosis: Cognitive communication deficit (R41.841)    Recommendations for follow up therapy are one component of a multi-disciplinary discharge planning process, led by the attending physician.  Recommendations may be updated based on patient status, additional functional criteria and  insurance authorization.    Follow Up Recommendations  Acute inpatient rehab (3hours/day)    Assistance Recommended at Discharge  Frequent or constant Supervision/Assistance  Functional Status Assessment Patient has had a recent decline in their functional status and demonstrates the ability to make significant improvements in function in a reasonable and predictable amount of time.  Frequency and Duration min 2x/week  2 weeks      SLP Evaluation Cognition  Overall Cognitive Status: Impaired/Different from baseline Arousal/Alertness: Awake/alert Orientation Level: Oriented X4 Year: 2025 Month: April Day of Week: Correct Attention: Sustained Sustained Attention: Impaired Sustained Attention Impairment: Verbal basic;Functional basic Memory: Impaired Memory Impairment: Storage deficit;Retrieval deficit Awareness: Appears intact Problem Solving: Impaired Problem Solving Impairment: Verbal basic;Functional basic Safety/Judgment: Appears intact       Comprehension  Auditory Comprehension Overall Auditory Comprehension: Appears within functional limits for tasks assessed    Expression Expression Primary Mode of Expression: Verbal Verbal Expression Overall Verbal Expression: Appears within functional limits for tasks assessed Written Expression Dominant Hand: Right Written Expression: Not tested   Oral / Motor  Oral Motor/Sensory Function Overall Oral Motor/Sensory Function: Within functional limits Motor Speech Overall Motor Speech: Impaired at baseline Respiration: Within functional limits Phonation: Low vocal intensity Resonance: Within functional limits Articulation: Impaired Level of Impairment: Conversation Intelligibility: Intelligible Motor Planning: Witnin functional limits Motor Speech Errors: Aware  Interfering Components: Premorbid status Effective Techniques: Slow rate;Increased vocal intensity;Over-articulate   Dorla Gartner, M.A.,  CCC-SLP         Lorena Benham A Devontaye Ground 09/26/2023, 3:12 PM

## 2023-09-26 NOTE — Progress Notes (Signed)
  Echocardiogram 2D Echocardiogram has been performed.  Bambi Lever P Vera Wishart 09/26/2023, 1:40 PM

## 2023-09-27 ENCOUNTER — Ambulatory Visit: Admitting: Physical Therapy

## 2023-09-27 ENCOUNTER — Ambulatory Visit

## 2023-09-27 ENCOUNTER — Ambulatory Visit: Admitting: Occupational Therapy

## 2023-09-27 DIAGNOSIS — E1159 Type 2 diabetes mellitus with other circulatory complications: Secondary | ICD-10-CM

## 2023-09-27 DIAGNOSIS — J439 Emphysema, unspecified: Secondary | ICD-10-CM | POA: Diagnosis not present

## 2023-09-27 DIAGNOSIS — E785 Hyperlipidemia, unspecified: Secondary | ICD-10-CM

## 2023-09-27 DIAGNOSIS — I6381 Other cerebral infarction due to occlusion or stenosis of small artery: Secondary | ICD-10-CM | POA: Diagnosis not present

## 2023-09-27 DIAGNOSIS — Z794 Long term (current) use of insulin: Secondary | ICD-10-CM

## 2023-09-27 DIAGNOSIS — N1832 Chronic kidney disease, stage 3b: Secondary | ICD-10-CM | POA: Diagnosis not present

## 2023-09-27 DIAGNOSIS — J84112 Idiopathic pulmonary fibrosis: Secondary | ICD-10-CM

## 2023-09-27 DIAGNOSIS — I6930 Unspecified sequelae of cerebral infarction: Secondary | ICD-10-CM

## 2023-09-27 DIAGNOSIS — E11649 Type 2 diabetes mellitus with hypoglycemia without coma: Secondary | ICD-10-CM

## 2023-09-27 DIAGNOSIS — E1169 Type 2 diabetes mellitus with other specified complication: Secondary | ICD-10-CM

## 2023-09-27 DIAGNOSIS — I152 Hypertension secondary to endocrine disorders: Secondary | ICD-10-CM

## 2023-09-27 LAB — CBC
HCT: 33.8 % — ABNORMAL LOW (ref 36.0–46.0)
Hemoglobin: 12.1 g/dL (ref 12.0–15.0)
MCH: 31.3 pg (ref 26.0–34.0)
MCHC: 35.8 g/dL (ref 30.0–36.0)
MCV: 87.3 fL (ref 80.0–100.0)
Platelets: 127 10*3/uL — ABNORMAL LOW (ref 150–400)
RBC: 3.87 MIL/uL (ref 3.87–5.11)
RDW: 15.6 % — ABNORMAL HIGH (ref 11.5–15.5)
WBC: 4.8 10*3/uL (ref 4.0–10.5)
nRBC: 0 % (ref 0.0–0.2)

## 2023-09-27 LAB — BASIC METABOLIC PANEL WITH GFR
Anion gap: 11 (ref 5–15)
BUN: 26 mg/dL — ABNORMAL HIGH (ref 8–23)
CO2: 21 mmol/L — ABNORMAL LOW (ref 22–32)
Calcium: 8.8 mg/dL — ABNORMAL LOW (ref 8.9–10.3)
Chloride: 108 mmol/L (ref 98–111)
Creatinine, Ser: 1.67 mg/dL — ABNORMAL HIGH (ref 0.44–1.00)
GFR, Estimated: 33 mL/min — ABNORMAL LOW (ref >60)
Glucose, Bld: 187 mg/dL — ABNORMAL HIGH (ref 70–99)
Potassium: 4.7 mmol/L (ref 3.5–5.1)
Sodium: 140 mmol/L (ref 135–145)

## 2023-09-27 LAB — GLUCOSE, CAPILLARY
Glucose-Capillary: 203 mg/dL — ABNORMAL HIGH (ref 70–99)
Glucose-Capillary: 52 mg/dL — ABNORMAL LOW (ref 70–99)
Glucose-Capillary: 189 mg/dL — ABNORMAL HIGH (ref 70–99)
Glucose-Capillary: 282 mg/dL — ABNORMAL HIGH (ref 70–99)

## 2023-09-27 LAB — PLATELET INHIBITION P2Y12: Platelet Function  P2Y12: 161 [PRU] — ABNORMAL LOW (ref 182–335)

## 2023-09-27 MED ORDER — PROCHLORPERAZINE EDISYLATE 10 MG/2ML IJ SOLN
5.0000 mg | Freq: Once | INTRAMUSCULAR | Status: AC
  Administered 2023-09-27: 5 mg via INTRAVENOUS
  Filled 2023-09-27: qty 2

## 2023-09-27 MED ORDER — HYDROCORTISONE 0.5 % EX CREA
TOPICAL_CREAM | Freq: Three times a day (TID) | CUTANEOUS | Status: DC
  Filled 2023-09-27: qty 28.35

## 2023-09-27 NOTE — Progress Notes (Signed)
 STROKE TEAM PROGRESS NOTE   SUBJECTIVE (INTERVAL HISTORY) Her son is on the phone.  Overall her condition is stable. No acute event overnight and neuro stable. Pending CIR. P2Y12 showed 161 though level, seems responding to plavix. Will continue.    OBJECTIVE Temp:  [97.5 F (36.4 C)-98.6 F (37 C)] 97.5 F (36.4 C) (04/16 0443) Pulse Rate:  [60-69] 65 (04/16 0800) Cardiac Rhythm: Normal sinus rhythm (04/16 0700) Resp:  [10-20] 14 (04/16 0800) BP: (137-153)/(75-96) 150/90 (04/16 0443) SpO2:  [100 %] 100 % (04/16 0800)  Recent Labs  Lab 09/26/23 0855 09/26/23 1152 09/26/23 1633 09/26/23 1943 09/27/23 0749  GLUCAP 86 123* 127* 206* 203*   Recent Labs  Lab 09/25/23 1130 09/25/23 1330 09/26/23 0340 09/27/23 0903  NA 137 135 141 140  K 6.8* 5.0 4.8 4.7  CL 112* 109 112* 108  CO2  --  20* 22 21*  GLUCOSE 83 171* 138* 187*  BUN 55* 34* 32* 26*  CREATININE 2.00* 1.62* 1.65* 1.67*  CALCIUM  --  8.2* 8.4* 8.8*   Recent Labs  Lab 09/25/23 1330  AST 21  ALT 12  ALKPHOS 89  BILITOT 0.5  PROT 6.2*  ALBUMIN 3.1*   Recent Labs  Lab 09/25/23 1126 09/25/23 1130 09/26/23 0340 09/27/23 0903  WBC 5.0  --  4.5 4.8  NEUTROABS 3.5  --   --   --   HGB 12.0 11.2* 11.7* 12.1  HCT 34.2* 33.0* 33.0* 33.8*  MCV 88.8  --  88.7 87.3  PLT 157  --  126* 127*   No results for input(s): "CKTOTAL", "CKMB", "CKMBINDEX", "TROPONINI" in the last 168 hours. Recent Labs    09/25/23 1126  LABPROT 14.4  INR 1.1   Recent Labs    09/25/23 1218  COLORURINE COLORLESS*  LABSPEC 1.012  PHURINE 6.0  GLUCOSEU >=500*  HGBUR NEGATIVE  BILIRUBINUR NEGATIVE  KETONESUR NEGATIVE  PROTEINUR NEGATIVE  NITRITE NEGATIVE  LEUKOCYTESUR NEGATIVE       Component Value Date/Time   CHOL 178 09/26/2023 0341   CHOL 132 03/08/2021 1143   TRIG 231 (H) 09/26/2023 0341   HDL 54 09/26/2023 0341   HDL 76 03/08/2021 1143   CHOLHDL 3.3 09/26/2023 0341   VLDL 46 (H) 09/26/2023 0341   LDLCALC 78  09/26/2023 0341   LDLCALC 31 07/16/2021 0000   Lab Results  Component Value Date   HGBA1C 5.6 09/26/2023      Component Value Date/Time   LABOPIA NONE DETECTED 03/18/2023 1635   LABOPIA NONE DETECTED 06/26/2020 0643   COCAINSCRNUR NONE DETECTED 03/18/2023 1635   LABBENZ NONE DETECTED 03/18/2023 1635   LABBENZ NONE DETECTED 06/26/2020 0643   AMPHETMU NONE DETECTED 03/18/2023 1635   AMPHETMU NONE DETECTED 06/26/2020 0643   THCU NONE DETECTED 03/18/2023 1635   THCU NONE DETECTED 06/26/2020 0643   LABBARB NONE DETECTED 03/18/2023 1635   LABBARB NONE DETECTED 06/26/2020 0643    Recent Labs  Lab 09/25/23 1128  ETH <10    I have personally reviewed the radiological images below and agree with the radiology interpretations.  ECHOCARDIOGRAM COMPLETE Result Date: 09/26/2023    ECHOCARDIOGRAM REPORT   Patient Name:   SHAKAYLA HICKOX Date of Exam: 09/26/2023 Medical Rec #:  [id number]    Height:       62.0 in Accession #:    [id number]   Weight:       159.0 lb Date of Birth:  09-Feb-1956    BSA:  1.734 m Patient Age:    68 years     BP:           140/87 mmHg Patient Gender: F            HR:           67 bpm. Exam Location:  Inpatient Procedure: 2D Echo, Cardiac Doppler, Color Doppler and Intracardiac            Opacification Agent (Both Spectral and Color Flow Doppler were            utilized during procedure). Indications:    Stroke  History:        Patient has prior history of Echocardiogram examinations, most                 recent 03/05/2023. COPD; Risk Factors:Hypertension, Diabetes,                 Dyslipidemia and Former Smoker.  Sonographer:    Reta Cassis Referring Phys: [id number] VISHAL R PATEL  Sonographer Comments: Technically difficult study due to poor echo windows and patient is obese. Image acquisition challenging due to COPD. IMPRESSIONS  1. Left ventricular ejection fraction, by estimation, is 55 to 60%. The left ventricle has normal function. The left ventricle demonstrates  regional wall motion abnormalities (see scoring diagram/findings for description). There is mild left ventricular  hypertrophy. Left ventricular diastolic parameters are consistent with Grade I diastolic dysfunction (impaired relaxation).  2. Right ventricular systolic function is normal. The right ventricular size is normal.  3. Left atrial size was mildly dilated.  4. The mitral valve is normal in structure. No evidence of mitral valve regurgitation. No evidence of mitral stenosis.  5. The aortic valve is calcified. There is moderate calcification of the aortic valve. There is moderate thickening of the aortic valve. Aortic valve regurgitation is mild. Aortic valve sclerosis is present, with no evidence of aortic valve stenosis. Aortic valve mean gradient measures 4.0 mmHg. Aortic valve Vmax measures 1.36 m/s.  6. The inferior vena cava is normal in size with greater than 50% respiratory variability, suggesting right atrial pressure of 3 mmHg. FINDINGS  Left Ventricle: Left ventricular ejection fraction, by estimation, is 55 to 60%. The left ventricle has normal function. The left ventricle demonstrates regional wall motion abnormalities. Definity contrast agent was given IV to delineate the left ventricular endocardial borders. The left ventricular internal cavity size was normal in size. There is mild left ventricular hypertrophy. Left ventricular diastolic parameters are consistent with Grade I diastolic dysfunction (impaired relaxation).  LV Wall Scoring: The basal inferior segment is akinetic. The basal inferoseptal segment is hypokinetic. Right Ventricle: The right ventricular size is normal. No increase in right ventricular wall thickness. Right ventricular systolic function is normal. Left Atrium: Left atrial size was mildly dilated. Right Atrium: Right atrial size was normal in size. Pericardium: There is no evidence of pericardial effusion. Mitral Valve: The mitral valve is normal in structure. No  evidence of mitral valve regurgitation. No evidence of mitral valve stenosis. Tricuspid Valve: The tricuspid valve is normal in structure. Tricuspid valve regurgitation is not demonstrated. No evidence of tricuspid stenosis. Aortic Valve: The aortic valve is calcified. There is moderate calcification of the aortic valve. There is moderate thickening of the aortic valve. Aortic valve regurgitation is mild. Aortic valve sclerosis is present, with no evidence of aortic valve stenosis. Aortic valve mean gradient measures 4.0 mmHg. Aortic valve peak gradient measures 7.4 mmHg. Aortic valve area,  by VTI measures 1.89 cm. Pulmonic Valve: The pulmonic valve was normal in structure. Pulmonic valve regurgitation is not visualized. No evidence of pulmonic stenosis. Aorta: The aortic root is normal in size and structure. Venous: The inferior vena cava is normal in size with greater than 50% respiratory variability, suggesting right atrial pressure of 3 mmHg. IAS/Shunts: No atrial level shunt detected by color flow Doppler.  LEFT VENTRICLE PLAX 2D LVIDd:         3.90 cm   Diastology LVIDs:         2.50 cm   LV e' medial:    3.26 cm/s LV PW:         1.20 cm   LV E/e' medial:  16.2 LV IVS:        1.40 cm   LV e' lateral:   5.22 cm/s LVOT diam:     2.00 cm   LV E/e' lateral: 10.1 LV SV:         55 LV SV Index:   32 LVOT Area:     3.14 cm  RIGHT VENTRICLE             IVC RV Basal diam:  3.60 cm     IVC diam: 1.40 cm RV S prime:     10.60 cm/s TAPSE (M-mode): 2.9 cm LEFT ATRIUM             Index        RIGHT ATRIUM           Index LA diam:        3.80 cm 2.19 cm/m   RA Area:     16.10 cm LA Vol (A2C):   69.5 ml 40.08 ml/m  RA Volume:   39.40 ml  22.72 ml/m LA Vol (A4C):   44.8 ml 25.83 ml/m LA Biplane Vol: 55.8 ml 32.18 ml/m  AORTIC VALVE AV Area (Vmax):    1.82 cm AV Area (Vmean):   1.81 cm AV Area (VTI):     1.89 cm AV Vmax:           136.00 cm/s AV Vmean:          90.500 cm/s AV VTI:            0.289 m AV Peak Grad:       7.4 mmHg AV Mean Grad:      4.0 mmHg LVOT Vmax:         79.00 cm/s LVOT Vmean:        52.200 cm/s LVOT VTI:          0.174 m LVOT/AV VTI ratio: 0.60  AORTA Ao Root diam: 3.20 cm Ao Asc diam:  3.10 cm MITRAL VALVE MV Area (PHT): 3.17 cm     SHUNTS MV Decel Time: 239 msec     Systemic VTI:  0.17 m MV E velocity: 52.80 cm/s   Systemic Diam: 2.00 cm MV A velocity: 102.00 cm/s MV E/A ratio:  0.52 Dorothye Gathers MD Electronically signed by Dorothye Gathers MD Signature Date/Time: 09/26/2023/2:46:16 PM    Final    MR BRAIN WO CONTRAST Result Date: 09/25/2023 CLINICAL DATA:  Neuro deficit, concern for stroke. Left-sided symptoms, left facial numbness, slurred speech. EXAM: MRI HEAD WITHOUT CONTRAST TECHNIQUE: Multiplanar, multiecho pulse sequences of the brain and surrounding structures were obtained without intravenous contrast. COMPARISON:  Same day CT head and CTA head and neck. MRI head 03/18/2023. FINDINGS: Brain: Punctate focus of restricted diffusion involving the left thalamus partially  extending into the cerebral peduncle compatible with acute lacunar infarct. No additional areas of acute infarct pre shaded. Focal encephalomalacia in the right cerebral hemisphere compatible with remote infarcts. Mild associated susceptibility suggestive of prior hemorrhage. Additional small focus of susceptibility in the left middle frontal gyrus with encephalomalacia and gliosis compatible with remote infarct and remote hemorrhage. Additional remote infarcts in the right frontal lobe, bilateral occipital lobes, small remote infarcts in the left cerebellum, and remote lacunar infarcts in the bilateral basal ganglia and bilateral thalami. Scattered and confluent FLAIR signal abnormality in the periventricular and subcortical white matter suggestive of moderate chronic microvascular ischemic changes. No midline shift. The basilar cisterns are patent. No extra-axial fluid collections. Ventricles: Prominence of the lateral ventricles  suggestive of underlying parenchymal volume loss. Vascular: Skull base flow voids are visualized. Skull and upper cervical spine: No focal abnormality. Sinuses/Orbits: Orbits are symmetric. Paranasal sinuses are clear. Other: Mastoid air cells are clear. IMPRESSION: Acute lacunar infarct in the left thalamus. Multiple additional remote infarcts as above, similar to 2024. Moderate chronic microvascular ischemic changes and mild parenchymal volume loss. These results were communicated to [facility] at 5:48 pm on 09/25/2023 by text page via the [facility] messaging system. Electronically Signed   By: Denny Flack M.D.   On: 09/25/2023 17:49   CT ANGIO HEAD NECK W WO CM (CODE STROKE) Result Date: 09/25/2023 CLINICAL DATA:  Provided history: Neuro deficit, acute, stroke suspected. Additional history obtained from electronic MEDICAL RECORD NUMBERFacial numbness, slurred speech. EXAM: CT ANGIOGRAPHY HEAD AND NECK WITH AND WITHOUT CONTRAST TECHNIQUE: Multidetector CT imaging of the head and neck was performed using the standard protocol during bolus administration of intravenous contrast. Multiplanar CT image reconstructions and MIPs were obtained to evaluate the vascular anatomy. Carotid stenosis measurements (when applicable) are obtained utilizing NASCET criteria, using the distal internal carotid diameter as the denominator. RADIATION DOSE REDUCTION: This exam was performed according to the departmental dose-optimization program which includes automated exposure control, adjustment of the mA and/or kV according to patient size and/or use of iterative reconstruction technique. CONTRAST:  75mL OMNIPAQUE IOHEXOL 300 MG/ML  SOLN COMPARISON:  Non-contrast head CT performed earlier today 09/25/2023. CT angiogram head/neck 03/18/2023. FINDINGS: CTA NECK FINDINGS Aortic arch: Standard aortic branching. The visualized thoracic aorta is normal in caliber. Streak/beam hardening artifact arising from a dense contrast bolus partially  obscures the left subclavian artery. Within this limitation, there is no appreciable hemodynamically significant innominate or proximal subclavian artery stenosis. Right carotid system: CCA and ICA patent within the neck without stenosis or significant atherosclerotic disease. No evidence of dissection. Left carotid system: Beam hardening artifact arising from a dense contrast bolus partially obscures the proximal CCA. Within this limitation, the CCA and ICA are patent within the neck without stenosis or significant atherosclerotic disease. No evidence of dissection. Vertebral arteries: Codominant and patent within the neck without stenosis or significant atherosclerotic disease. No evidence of dissection. Skeleton: Spondylosis at the cervical and visualized upper thoracic levels. No acute fracture or aggressive osseous lesion. Other neck: No neck mass or cervical lymphadenopathy. Upper chest: No consolidation within the imaged lung apices. Centrilobular paraseptal emphysema. Incompletely assessed fibrotic lung disease. Review of the MIP images confirms the above findings CTA HEAD FINDINGS Anterior circulation: The intracranial internal carotid arteries are patent. Atherosclerotic plaque within both vessels with no more than mild stenosis. The M1 middle cerebral arteries are patent. No M2 proximal branch occlusion or high-grade proximal stenosis. The anterior cerebral arteries are patent. No intracranial  aneurysm is identified. Posterior circulation: The intracranial vertebral arteries are patent. The basilar artery is patent. 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 The posterior cerebral arteries are patent. Posterior communicating arteries are diminutive or absent, bilaterally. Venous sinuses: Within the limitations of contrast timing, no convincing thrombus. Anatomic variants: As described. Review of the MIP images confirms the above findings No emergent large vessel occlusion  identified. These results were communicated to [facility] at 12:10 pmon 4/14/2025by text page via the [facility] messaging system. IMPRESSION: CTA neck: 1. Artifact arising from a dense contrast bolus partially obscures the proximal left common carotid artery. Within this limitation, the common carotid, internal carotid and vertebral arteries are patent within the neck without stenosis or significant atherosclerotic disease. No evidence of dissection. 2. Incompletely assessed fibrotic lung disease. 3. Emphysema (ICD10-J43.9). CTA head: 1. No proximal intracranial large vessel occlusion or high-grade proximal arterial stenosis identified. 2. Atherosclerotic plaque within the intracranial internal carotid arteries with no more than mild stenosis. Electronically Signed   By: Bascom Lily D.O.   On: 09/25/2023 12:10   CT HEAD CODE STROKE WO CONTRAST Result Date: 09/25/2023 CLINICAL DATA:  Code stroke. EXAM: CT HEAD WITHOUT CONTRAST TECHNIQUE: Contiguous axial images were obtained from the base of the skull through the vertex without intravenous contrast. RADIATION DOSE REDUCTION: This exam was performed according to the departmental dose-optimization program which includes automated exposure control, adjustment of the mA and/or kV according to patient size and/or use of iterative reconstruction technique. COMPARISON:  06/09/2023 FINDINGS: Brain: Scattered areas of gray-white differentiation loss in the bilateral occipital parietal cortex and in the anterior and superior frontal lobes. Small chronic infarcts in the bilateral cerebellum. Chronic lacunar infarcts in the bilateral thalamus and caudate. Ischemic gliosis is present in the bilateral cerebral white matter. No evidence of acute infarct, hemorrhage, hydrocephalus, or mass. Vascular: No hyperdense vessel or unexpected calcification. Skull: Normal. Negative for fracture or focal lesion. Sinuses/Orbits: No acute finding. IMPRESSION: 1. No acute finding. 2. Numerous  small cortical, cerebellar, and lacunar infarcts as seen on head CT 06/09/2023. Electronically Signed   By: Ronnette Coke M.D.   On: 09/25/2023 11:34   CUP PACEART REMOTE DEVICE CHECK Result Date: 09/14/2023 ILR summary report received. Battery status OK. Normal device function. No new symptom, tachy, brady, or pause episodes. No new AF episodes.  Recordings list: 1 Asystole on 09/05/2023, no EGMs, sent to traige    Monthly summary reports and ROV/PRN ML, CVRS    PHYSICAL EXAM  Temp:  [97.5 F (36.4 C)-98.6 F (37 C)] 97.5 F (36.4 C) (04/16 0443) Pulse Rate:  [60-69] 65 (04/16 0800) Resp:  [10-20] 14 (04/16 0800) BP: (137-153)/(75-96) 150/90 (04/16 0443) SpO2:  [100 %] 100 % (04/16 0800)  General - Well nourished, well developed, in no apparent distress.  Ophthalmologic - fundi not visualized due to noncooperation.  Cardiovascular - Regular rhythm and rate.  Neuro - awake, alert, eyes open, orientated to age, place, time. No aphasia, but slow talking, following all simple commands. Able to name and repeat. No gaze palsy, tracking bilaterally, visual field full. No facial droop. Tongue midline. RUE and RLE 4/5 and LUE and LLE 4-/5. Sensation mildlly decreased on the left, left FTN and HTS mild dysmetria, gait not tested.     ASSESSMENT/PLAN [name] is a 68 y.o. female with history of hypertension, diabetes, COPD, recurrent strokes, rectal cancer admitted for right tongue and right groin numbness, difficulty walking, altered mental status. No  TNK given due to outside window.    Stroke:  left thalamic infarct likely secondary to small vessel disease source CT no acute abnormality CT head and neck unremarkable MRI small left thalamic infarct 2D Echo EF 55 to 60% Loop recorder interrogation no afib LDL 78 HgbA1c 5.6 P2 Y12 = 161 though level SCDs for VTE prophylaxis aspirin 81 mg daily prior to admission, now on aspirin 81 mg daily and clopidogrel 75 mg daily DAPT for 3  weeks and then plavix alone. Ongoing aggressive stroke risk factor management Therapy recommendations: CIR Disposition: Pending  History of stroke 06/2020 admitted for right caudate head and right lateral thalamus infarcts.  MRI and carotid Doppler negative.  EF 55 to 60%.  LDL 86, A1c 5.2.  Recommend 30-day CardioNet monitor.  Discharged on DAPT and Lipitor 40. 10/2020 admitted for right small parietal occipital infarct.  LDL 56, A1c 5.2.  Loop recorder placed.  Discharged on DAPT. 02/2023 admitted for bilateral CR small infarcts.  Loop recorder interrogation no in A-fib.  Discharged on DAPT 03/2023 admitted for syncope and found to have right-sided weakness.  MRI showed left thalamic infarct.  Discharged on DAPT and Crestor 06/2023 hypercoag workup negative with oncology  Diabetes HgbA1c 5.6 goal < 7.0 Controlled CBG monitoring SSI DM education and close PCP follow up  Hypertension Stable Avoid low BP Long term BP goal normotensive  Hyperlipidemia Home meds: Crestor 40 LDL 78, goal < 70 Now on Crestor 40 Add Zetia Continue statin and Zetia at discharge  Other Stroke Risk Factors Advanced age  Other Active Problems COPD Depression on Zoloft and Effexor CKD 3B, creatinine 1.62--1.65--1.67  Hospital day # 1  Neurology will sign off. Please call with questions. Pt will follow up with stroke clinic NP Jessica at [facility] on 11/02/23. Thanks for the consult.   Consuelo Denmark, MD PhD Stroke Neurology 09/27/2023 10:48 AM    To contact Stroke Continuity provider, please refer to [email or web address]. After hours, contact General Neurology

## 2023-09-27 NOTE — Inpatient Diabetes Management (Signed)
 Inpatient Diabetes Program Recommendations  AACE/ADA: New Consensus Statement on Inpatient Glycemic Control (2015)  Target Ranges:  Prepandial:   less than 140 mg/dL      Peak postprandial:   less than 180 mg/dL (1-2 hours)      Critically ill patients:  140 - 180 mg/dL   Lab Results  Component Value Date   GLUCAP 52 (L) 09/27/2023   HGBA1C 5.6 09/26/2023    Review of Glycemic Control  Latest Reference Range & Units 09/26/23 08:55 09/26/23 11:52 09/26/23 16:33 09/26/23 19:43 09/27/23 07:49 09/27/23 12:29  Glucose-Capillary 70 - 99 mg/dL 86 409 (H) 811 (H) 914 (H) 203 (H)  Novolog 3 units  52 (L)  (H): Data is abnormally high (L): Data is abnormally low  Diabetes history: DM2 Outpatient Diabetes medications: Farxiga 10 mg every day, Lantus 30 units QD Current orders for Inpatient glycemic control: Novolog 0-9 units TID  Inpatient Diabetes Program Recommendations:    Hypoglycemia at noon-52 mg/dL  Might consider costume correction scale:  Novolog 0-6 units TID once > 200 mg/dL.    Will continue to follow while inpatient.  Thank you, Hays Lipschutz, MSN, CDCES Diabetes Coordinator Inpatient Diabetes Program [phone] (team pager from 8a-5p)

## 2023-09-27 NOTE — Evaluation (Signed)
 Occupational Therapy Evaluation Patient Details Name: Erica [name] MRN: [id number] DOB: 1955-11-27 Today's Date: 09/27/2023   History of Present Illness   Patient is a 68 y/o female admitted 09/25/23 with R facial numbness, slurred speech.  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  She had CVA previously with implantable loop recorder in place.  PMH positive for COPD/IPF, DM, CKD IIIb, HTN, HLD, depression/anxiety.     Clinical Impressions Pt c/o headache 8/10 pain. Pt lives alone, son comes by daily to assist as needed. PLOF mod I for ADLs using cane, son/sister help with IADLs, has an aide 3X/week. Pt at this time requires set up to min A for dressing/bathing, increased time for all activities, able to take small steps at bedside using RW, min A for transfers. Pt's knees buckled when standing, able to control sit back to bed. Instructed in use of sock aide, able to use with increased time. Pt LUE overall good strength and ROM, requires increased time to initiate movements with LUE. Recommending postacute intensive rehab >3hrs/day to maximize strength and functional independence with ADLs to return to PLOF, son can assist as needed upon DC. Will continue to follow acutely to progress as able.      If plan is discharge home, recommend the following:   A little help with walking and/or transfers;A little help with bathing/dressing/bathroom;Assistance with cooking/housework;Assist for transportation;Help with stairs or ramp for entrance     Functional Status Assessment   Patient has had a recent decline in their functional status and demonstrates the ability to make significant improvements in function in a reasonable and predictable amount of time.     Equipment Recommendations   Other (comment) (defer)     Recommendations for Other Services   Rehab consult     Precautions/Restrictions   Precautions Precautions: Fall Recall of Precautions/Restrictions:  Intact Restrictions Weight Bearing Restrictions Per Provider Order: No     Mobility Bed Mobility Overal bed mobility: Needs Assistance Bed Mobility: Supine to Sit, Sit to Supine     Supine to sit: Min assist, HOB elevated Sit to supine: Min assist   General bed mobility comments: min A in/out of bed    Transfers Overall transfer level: Needs assistance Equipment used: Rolling walker (2 wheels) Transfers: Sit to/from Stand, Bed to chair/wheelchair/BSC Sit to Stand: Min assist     Step pivot transfers: Min assist     General transfer comment: min A for STS, knees buckled a little at first, able to step pivot with CGA-min A using RW      Balance Overall balance assessment: Needs assistance Sitting-balance support: No upper extremity supported, Feet supported Sitting balance-Leahy Scale: Fair     Standing balance support: Bilateral upper extremity supported, During functional activity, Reliant on assistive device for balance Standing balance-Leahy Scale: Poor Standing balance comment: knees buckled a little, reliant on RW for support                           ADL either performed or assessed with clinical judgement   ADL Overall ADL's : Needs assistance/impaired Eating/Feeding: Modified independent;Sitting   Grooming: Set up;Sitting   Upper Body Bathing: Set up;Sitting   Lower Body Bathing: Minimal assistance;Sitting/lateral leans   Upper Body Dressing : Set up;Sitting   Lower Body Dressing: Minimal assistance;Sit to/from stand   Toilet Transfer: Minimal assistance;Stand-pivot;Rolling walker (2 wheels);BSC/3in1   Toileting- Clothing Manipulation and Hygiene: Minimal assistance;Sit to/from stand  General ADL Comments: Pt min A for sit to stand using RW and transfer to [facility]. Pt min A for LB dressing/bathing, instructed on use of sock aide, increased time to use.     Vision Baseline Vision/History: 1 Wears glasses Ability to See in  Adequate Light: 0 Adequate Patient Visual Report: No change from baseline       Perception         Praxis         Pertinent Vitals/Pain Pain Assessment Pain Assessment: 0-10 Pain Score: 8  Pain Location: headache Pain Descriptors / Indicators: Headache Pain Intervention(s): Monitored during session     Extremity/Trunk Assessment Upper Extremity Assessment Upper Extremity Assessment: LUE deficits/detail LUE Deficits / Details: overall good strength/ROM, increased time for motor planning LUE Coordination: decreased fine motor   Lower Extremity Assessment Lower Extremity Assessment: Defer to PT evaluation       Communication Communication Communication: No apparent difficulties   Cognition Arousal: Alert Behavior During Therapy: [facility] for tasks assessed/performed                                 Following commands: Intact       Cueing  General Comments          Exercises     Shoulder Instructions      Home Living Family/patient expects to be discharged to:: Private residence Living Arrangements: Alone Available Help at Discharge: Family;Available PRN/intermittently Type of Home: Apartment Home Access: Level entry     Home Layout: One level     Bathroom Shower/Tub: Chief Strategy Officer: Standard Bathroom Accessibility: Yes How Accessible: Accessible via walker Home Equipment: Cane - single point;Shower Counsellor (2 wheels)   Additional Comments: has an aide 3x/week 2 hours, Son comes by daily to check on Pt.  Lives With: Family    Prior Functioning/Environment Prior Level of Function : Independent/Modified Independent             Mobility Comments: uses cane versus walker depending on the day ADLs Comments: sister and son assist with IADL's, she prepares microwave meals    OT Problem List: Decreased strength;Decreased activity tolerance;Impaired balance (sitting and/or standing);Pain   OT  Treatment/Interventions: Self-care/ADL training;Therapeutic exercise;Energy conservation;DME and/or AE instruction;Therapeutic activities;Patient/family education;Balance training      OT Goals(Current goals can be found in the care plan section)   Acute Rehab OT Goals Patient Stated Goal: to manage headache pain OT Goal Formulation: With patient Time For Goal Achievement: 10/11/23 Potential to Achieve Goals: Good   OT Frequency:  Min 2X/week    Co-evaluation              AM-PAC OT "6 Clicks" Daily Activity     Outcome Measure Help from another person eating meals?: None Help from another person taking care of personal grooming?: A Little Help from another person toileting, which includes using toliet, bedpan, or urinal?: A Little Help from another person bathing (including washing, rinsing, drying)?: A Little Help from another person to put on and taking off regular upper body clothing?: A Little Help from another person to put on and taking off regular lower body clothing?: A Little 6 Click Score: 19   End of Session Equipment Utilized During Treatment: Gait belt;Rolling walker (2 wheels) Nurse Communication: Mobility status  Activity Tolerance: Patient tolerated treatment well Patient left: in bed;with call bell/phone within reach  OT Visit Diagnosis: Unsteadiness on  feet (R26.81);Other abnormalities of gait and mobility (R26.89);Muscle weakness (generalized) (M62.81);Pain                Time: 9147-8295 OT Time Calculation (min): 32 min Charges:  OT General Charges $OT Visit: 1 Visit OT Evaluation $OT Eval Low Complexity: 1 Low OT Treatments $Self Care/Home Management : 8-22 mins  [address] OTR/L   Scherry Curtis 09/27/2023, 12:24 PM

## 2023-09-27 NOTE — Progress Notes (Signed)
 Physical Therapy Treatment Patient Details Name: Erica [name] MRN: [id number] DOB: July 25, 1955 Today's Date: 09/27/2023   History of Present Illness Patient is a 68 y/o female admitted 09/25/23 with R facial numbness, slurred speech.  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  She had CVA previously with implantable loop recorder in place.  PMH positive for COPD/IPF, DM, CKD IIIb, HTN, HLD, depression/anxiety.    PT Comments  Pt admitted with above diagnosis. Pt was able to ambulate to bathroom and back with min assist with RW but limits self due to incr Headache once up as well as c/o sharp pain right ear (states this began about a week ago).  Notified MD and will continue to progress pt as able.  Pt currently with functional limitations due to the deficits listed below (see PT Problem List). Pt will benefit from acute skilled PT to increase their independence and safety with mobility to allow discharge.       If plan is discharge home, recommend the following: A little help with walking and/or transfers;A little help with bathing/dressing/bathroom;Assistance with cooking/housework;Assist for transportation;Help with stairs or ramp for entrance;Direct supervision/assist for medications management   Can travel by private vehicle        Equipment Recommendations  None recommended by PT    Recommendations for Other Services Rehab consult     Precautions / Restrictions Precautions Precautions: Fall Recall of Precautions/Restrictions: Intact Restrictions Weight Bearing Restrictions Per Provider Order: No     Mobility  Bed Mobility Overal bed mobility: Needs Assistance Bed Mobility: Supine to Sit, Sit to Supine     Supine to sit: Min assist, HOB elevated Sit to supine: Min assist   General bed mobility comments: min A in/out of bed    Transfers Overall transfer level: Needs assistance Equipment used: Rolling walker (2 wheels) Transfers: Sit to/from Stand, Bed to  chair/wheelchair/BSC Sit to Stand: Min assist   Step pivot transfers: Min assist       General transfer comment: min A for STS, left buckled a little at times with pt using RW    Ambulation/Gait Ambulation/Gait assistance: Min assist Gait Distance (Feet): 20 Feet (20 feet x 2) Assistive device: Rolling walker (2 wheels) Gait Pattern/deviations: Step-to pattern, Step-through pattern, Decreased stride length, Trunk flexed, Decreased dorsiflexion - left, Knee flexed in stance - left   Gait velocity interpretation: <1.31 ft/sec, indicative of household ambulator   General Gait Details: slow pace, increased L knee flexion in stance with distance, reported feeling light headed, though BP stable from supine to sitting; reported headache that started with ambulation. Only able to ambulate to bathroom and back. Needed assist for cleaning and pulling Depends down and back up.   Stairs             Wheelchair Mobility     Tilt Bed    Modified Rankin (Stroke Patients Only) Modified Rankin (Stroke Patients Only) Pre-Morbid Rankin Score: Moderate disability Modified Rankin: Moderately severe disability     Balance Overall balance assessment: Needs assistance Sitting-balance support: No upper extremity supported, Feet supported Sitting balance-Leahy Scale: Fair     Standing balance support: Bilateral upper extremity supported, During functional activity, Reliant on assistive device for balance Standing balance-Leahy Scale: Poor Standing balance comment: knee left buckled a little, reliant on RW for support                            Communication Communication  Communication: No apparent difficulties  Cognition Arousal: Alert Behavior During Therapy: WFL for tasks assessed/performed   PT - Cognitive impairments: No apparent impairments                         Following commands: Intact      Cueing Cueing Techniques: Verbal cues, Tactile cues   Exercises      General Comments General comments (skin integrity, edema, etc.): [place] 72 bpm, 99%RA, 131/85; sitting 77 bpm, 132/73; standing 84 bpm, 144/133; End session 84bpm, 147/87.      Pertinent Vitals/Pain Pain Assessment Pain Assessment: 0-10 Pain Score: 8  Pain Location: headache Pain Descriptors / Indicators: Headache Pain Intervention(s): Limited activity within patient's tolerance, Monitored during session, Repositioned, Patient requesting pain meds-RN notified    Home Living Family/patient expects to be discharged to:: Private residence Living Arrangements: Alone Available Help at Discharge: Family;Available PRN/intermittently Type of Home: Apartment Home Access: Level entry       Home Layout: One level Home Equipment: Cane - single point;Shower Counsellor (2 wheels) Additional Comments: has an aide 3x/week 2 hours, Son comes by daily to check on Pt.    Prior Function            PT Goals (current goals can now be found in the care plan section) Acute Rehab PT Goals Patient Stated Goal: return to independent Progress towards PT goals: Progressing toward goals    Frequency    Min 3X/week      PT Plan      Co-evaluation              AM-PAC PT "6 Clicks" Mobility   Outcome Measure  Help needed turning from your back to your side while in a flat bed without using bedrails?: A Little Help needed moving from lying on your back to sitting on the side of a flat bed without using bedrails?: A Lot Help needed moving to and from a bed to a chair (including a wheelchair)?: A Little Help needed standing up from a chair using your arms (e.g., wheelchair or bedside chair)?: A Little Help needed to walk in hospital room?: A Little Help needed climbing 3-5 steps with a railing? : Total 6 Click Score: 15    End of Session Equipment Utilized During Treatment: Gait belt Activity Tolerance: Patient limited by fatigue Patient left: in bed;with  call bell/phone within reach;with bed alarm set Nurse Communication: Mobility status PT Visit Diagnosis: Other abnormalities of gait and mobility (R26.89)     Time: 1093-2355 PT Time Calculation (min) (ACUTE ONLY): 23 min  Charges:    $Gait Training: 23-37 mins PT General Charges $$ ACUTE PT VISIT: 1 Visit                     Malay Fantroy M,PT Acute Rehab Services [phone]    Florencia Hunter 09/27/2023, 1:31 PM

## 2023-09-27 NOTE — Therapy (Signed)
 [facility] [address] [place], [place], [address] Phone: [phone]   Fax:  [phone]  Patient Details  Name: Erica [name] MRN: [id number] Date of Birth: Oct 12, 1955 Referring Provider:  Johny Nap, NP   Encounter Date: 09/27/2023  SPEECH THERAPY DISCHARGE SUMMARY  Visits from Start of Care: 2  Current functional level related to goals / functional outcomes: Unknown at this time as pt discharged d/t new stroke   Remaining deficits: Unknown in light of new stroke   Education / Equipment: Cognitive compensations/strategies   Patient agrees to discharge. Patient goals were not met. Patient is being discharged due to a change in medical status.Tamar Fairly, CCC-SLP 09/27/2023, 8:59 AM  [place] [facility] [address] [place], [place], [address] Phone: [phone]   Fax:  [phone]

## 2023-09-27 NOTE — Consult Note (Signed)
 Physical Medicine and Rehabilitation Consult Reason for Consult: Rehab Referring Physician: Dr. Randol Kern   HPI: Erica [name] is a 68 y.o. female with past medical history of hypertension, depression, diabetes, COPD, prior strokes, rectal cancer who was admitted with left thalamic CVA after she developed right facial and tongue numbness and altered speech.  Patient was seen by neurology and aspirin 81 mg was added to Plavix 75 mg.  She had a MRI on 09/25/2023 that showed acute lacunar infarct of the left thalamus, multiple additional remote strokes and chronic moderate microvascular ischemic changes.  Zetia was added to Crestor.  Patient was treated in CIR last year also after CVA.  She has been seen by speech therapy for impaired speech and cognition.  Patient was seen by physical therapy with deficits in the min assist/mod assist level with CIR recommended.   Pt reports living in 1 story home with 2 STE, she reports son can assist 24/7 after discharge.   Review of Systems  Constitutional:  Negative for chills and fever.  HENT:  Positive for ear pain. Negative for congestion.   Eyes:  Negative for blurred vision and double vision.  Respiratory:  Negative for shortness of breath.   Cardiovascular:  Negative for chest pain.  Gastrointestinal:  Negative for abdominal pain, constipation, diarrhea and vomiting.  Genitourinary: Negative.   Musculoskeletal:  Negative for joint pain.  Skin:  Negative for rash.  Neurological:  Positive for sensory change, speech change, weakness and headaches.   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   GERD (gastroesophageal reflux disease)    with chronic cough   Headache(784.0)    "mild"   Hypertension    Loop Biotronik loop implant 12/16/2020 12/16/2020   Mental disorder     depression   Neuropathy    feet    Pain    arthritis pain - takes tramadol as needed   Pulmonary fibrosis, unspecified (HCC)    Rectal cancer (HCC)    Rectal polyp    very little bleeding with bowel movements- no pain   Stroke (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   Suicide attempt [facility])    Past Surgical History:  Procedure Laterality Date   ANAL RECTAL MANOMETRY N/A 07/13/2016   Procedure: ANO RECTAL MANOMETRY;  Surgeon: Romie Levee, MD;  Location: [facility];  Service: Endoscopy;  Laterality: N/A;   CESAREAN SECTION     EUS N/A 11/21/2012   Procedure: LOWER ENDOSCOPIC ULTRASOUND (EUS);  Surgeon: Willis Modena, MD;  Location: [facility];  Service: Endoscopy;  Laterality: N/A;   FLEXIBLE SIGMOIDOSCOPY N/A 11/21/2012   Procedure: FLEXIBLE SIGMOIDOSCOPY;  Surgeon: Willis Modena, MD;  Location: [facility];  Service: Endoscopy;  Laterality: N/A;   FLEXIBLE SIGMOIDOSCOPY N/A 01/28/2013   Procedure: FLEXIBLE SIGMOIDOSCOPY;  Surgeon: Romie Levee, MD;  Location: [facility];  Service: Endoscopy;  Laterality: N/A;   LAPAROSCOPIC LOW ANTERIOR RESECTION N/A 01/29/2013   Procedure: LAPAROSCOPIC LOW ANTERIOR RESECTION, Rigid Proctoscopy;  Surgeon: Romie Levee, MD;  Location: [facility];  Service: General;  Laterality: N/A;   LAPAROSCOPIC SIGMOID COLECTOMY N/A 11/14/2012   Procedure: DIAGNOSTIC LAPAROSCOPY AND SIGMOIDMOIDOSCOPY ;  Surgeon: Emelia Loron, MD;  Location: [facility];  Service: General;  Laterality: N/A;   RECTAL ULTRASOUND N/A 07/13/2016   Procedure: RECTAL ULTRASOUND;  Surgeon: Joyce Nixon, MD;  Location: [facility];  Service: Endoscopy;  Laterality: N/A;   TONSILLECTOMY     TONSILLECTOMY AND ADENOIDECTOMY     Family History  Problem Relation Age of Onset   Hypertension Father    Stroke Father    Diabetes Father    Heart disease Mother    Hypertension Mother    Hyperlipidemia Mother    Hypertension Sister    Hypertension Brother    Hypertension Sister    Hypertension Brother    Cancer  Other 3       colon cancer    Cancer Maternal Aunt        cancer, unknown type    Cancer Maternal Uncle        bone cancer    Cancer Paternal Aunt        lung cancer   Cancer Paternal Uncle        lung cancer   Cancer Maternal Uncle        colon cancer and prostate cancer    Cancer Maternal Uncle        prostate cancer    Social History:  reports that she quit smoking about 5 years ago. Her smoking use included cigarettes. She started smoking about 25 years ago. She has a 10 pack-year smoking history. She has never used smokeless tobacco. She reports that she does not currently use alcohol. She reports that she does not currently use drugs after having used the following drugs: "Crack" cocaine. Allergies:  Allergies  Allergen Reactions   Penicillins Hives, Itching and Swelling    Tongue swelling   Medications Prior to Admission  Medication Sig Dispense Refill   albuterol (VENTOLIN HFA) 108 (90 Base) MCG/ACT inhaler Inhale 2 puffs into the lungs every 4 (four) hours as needed for wheezing. (Patient taking differently: Inhale 2 puffs into the lungs 2 (two) times daily as needed for wheezing.) 1 g 5   amLODipine (NORVASC) 10 MG tablet Take 1 tablet (10 mg total) by mouth daily. 30 tablet 0   cetirizine (ZYRTEC) 10 MG tablet Take 1 tablet (10 mg total) by mouth daily. 30 tablet 0   clopidogrel (PLAVIX) 75 MG tablet Take 75 mg by mouth daily.     diclofenac Sodium (VOLTAREN) 1 % GEL Apply 1 Application topically 2 (two) times daily as needed (pain).     DULoxetine (CYMBALTA) 30 MG capsule Take 30 mg by mouth daily.     FARXIGA 10 MG TABS tablet Take 10 mg by mouth daily.     gabapentin (NEURONTIN) 100 MG capsule Take 1 capsule (100 mg total) by mouth 2 (two) times daily. Patient is taking as needed (Patient taking differently: Take 100 mg by mouth daily as needed (pain).) 60 capsule 0   hydrALAZINE (APRESOLINE) 25 MG tablet SMARTSIG:1.5 Tablet(s) By Mouth Every 8 Hours     LANTUS  SOLOSTAR 100 UNIT/ML Solostar Pen Inject 30 Units into the skin daily.     naphazoline-glycerin (CLEAR EYES REDNESS) 0.012-0.25 % SOLN Place 1-2 drops into both eyes 3 (three) times daily with meals. (Patient taking differently: Place 2 drops into both eyes daily as needed for eye irritation.)     pantoprazole (PROTONIX) 40 MG tablet Take 40 mg by mouth daily.     Pirfenidone 801 MG TABS TAKE 1 TABLET (801 MG) BY MOUTH WITH BREAKFAST, WITH LUNCH, AND WITH EVENING MEAL. (Patient taking differently: Take 1 tablet by mouth in the morning and at bedtime.) 270 tablet 0  rosuvastatin (CRESTOR) 40 MG tablet Take 1 tablet (40 mg total) by mouth daily. 30 tablet 0   sertraline (ZOLOFT) 50 MG tablet Take 1 tablet (50 mg total) by mouth daily. 30 tablet 0   triamcinolone cream (KENALOG) 0.1 % Apply 1 Application topically daily.     venlafaxine XR (EFFEXOR-XR) 150 MG 24 hr capsule Take 1 capsule (150 mg total) by mouth daily. 30 capsule 0   SYMBICORT 160-4.5 MCG/ACT inhaler INHALE 2 PUFFS INTO THE LUNGS TWICE DAILY (Patient not taking: Reported on 09/25/2023) 10.2 g 5    Home: Home Living Family/patient expects to be discharged to:: Private residence Living Arrangements: Children Available Help at Discharge: Family, Available PRN/intermittently Type of Home: Apartment Home Access: Level entry Home Layout: One level Bathroom Shower/Tub: Engineer, manufacturing systems: Standard Home Equipment: [employer] - single point, Information systems manager, Agricultural consultant (2 wheels) Additional Comments: has an aide 3x/week 2 hours  Lives With: Family  Functional History: Prior Function Prior Level of Function : Independent/Modified Independent Mobility Comments: uses cane versus walker depending on the day ADLs Comments: sister and son assist with IADL's, she prepares microwave meals Functional Status:  Mobility: Bed Mobility Overal bed mobility: Needs Assistance Bed Mobility: Supine to Sit, Sit to Supine Supine to sit: Mod  assist, HOB elevated, Used rails Sit to supine: Min assist General bed mobility comments: assist for lifting trunk and scooting hips, to supine assist to scoot to [facility] Transfers Overall transfer level: Needs assistance Equipment used: Rolling walker (2 wheels) Transfers: Sit to/from Stand Sit to Stand: Min assist General transfer comment: up from tall stretcher to RW A for balance Ambulation/Gait Ambulation/Gait assistance: Editor, commissioning (Feet): 40 Feet Assistive device: Rolling walker (2 wheels) Gait Pattern/deviations: Step-to pattern, Step-through pattern, Decreased stride length, Trunk flexed, Decreased dorsiflexion - left, Knee flexed in stance - left General Gait Details: slow pace, increased L knee flexion in stance with distance, reported feeling light headed, though BP stable from supine to sitting; reported headache that started with ambulation    ADL:    Cognition: Cognition Overall Cognitive Status: Impaired/Different from baseline Arousal/Alertness: Awake/alert Orientation Level: Oriented X4 Year: 2025 Month: April Day of Week: Correct Attention: Sustained Sustained Attention: Impaired Sustained Attention Impairment: Verbal basic, Functional basic Memory: Impaired Memory Impairment: Storage deficit, Retrieval deficit Awareness: Appears intact Problem Solving: Impaired Problem Solving Impairment: Verbal basic, Functional basic Safety/Judgment: Appears intact Cognition Arousal: Alert Behavior During Therapy: WFL for tasks assessed/performed Overall Cognitive Status: Impaired/Different from baseline  Blood pressure (!) 150/90, pulse 67, temperature (!) 97.5 F (36.4 C), temperature source Oral, resp. rate 16, height 5\' 2"  (1.575 m), weight 72.1 kg, SpO2 100%. Physical Exam  General: No apparent distress HEENT: Head is normocephalic, atraumatic, sclera anicteric, oral mucosa a little dry Neck: Supple without JVD or lymphadenopathy Heart: Reg rate  and rhythm. Chest: CTA bilaterally without wheezes, rales, or rhonchi; no distress Abdomen: Soft, non-tender, non-distended, bowel sounds positive. Extremities: No clubbing, cyanosis, or edema. Pulses are 2+ Psych: Pt's affect is flat Skin: Clean and intact without signs of breakdown Neuro:    Mental Status: AAOx3 Speech/Languate: Slow speech. Naming and repetition intact,  follows simple commands CRANIAL NERVES: II: PERRL. Visual fields full III, IV, VI: EOM intact, no gaze preference or deviation V: altered R facial sensation VII: no asymmetry VIII: normal hearing to speech IX, X: normal palatal elevation XI: Intact shoulder shrug bilaterally XII: Tongue midline   MOTOR: RUE: 4/5 Deltoid, 4/5 Biceps, 4-/5 Triceps,4-/5 Grip  LUE: 5-/5 Deltoid, 5-/5 Biceps, 4-/5 Triceps, 4-/5 Grip RLE: HF 4-/5, KE 4-/5, ADF 4-/5, APF 4-/5 LLE: HF 4-/5, KE 4-/5, ADF 4-/5, APF 4-/5   REFLEXES: no ankle clonus  SENSORY: Altered Lue and LLE  Coordination: Altered Left finger to nose and heel to shin     Results for orders placed or performed during the hospital encounter of 09/25/23 (from the past 24 hours)  CBG monitoring, ED     Status: Abnormal   Collection Time: 09/26/23 11:52 AM  Result Value Ref Range   Glucose-Capillary 123 (H) 70 - 99 mg/dL  Glucose, capillary     Status: Abnormal   Collection Time: 09/26/23  4:33 PM  Result Value Ref Range   Glucose-Capillary 127 (H) 70 - 99 mg/dL  Glucose, capillary     Status: Abnormal   Collection Time: 09/26/23  7:43 PM  Result Value Ref Range   Glucose-Capillary 206 (H) 70 - 99 mg/dL  Glucose, capillary     Status: Abnormal   Collection Time: 09/27/23  7:49 AM  Result Value Ref Range   Glucose-Capillary 203 (H) 70 - 99 mg/dL  Platelet inhibition [id number] (not at [facility])     Status: Abnormal   Collection Time: 09/27/23  9:03 AM  Result Value Ref Range   Platelet Function  P2Y12 161 (L) 182 - 335 PRU  CBC     Status: Abnormal   Collection  Time: 09/27/23  9:03 AM  Result Value Ref Range   WBC 4.8 4.0 - 10.5 K/uL   RBC 3.87 3.87 - 5.11 MIL/uL   Hemoglobin 12.1 12.0 - 15.0 g/dL   HCT 11.9 (L) 14.7 - 82.9 %   MCV 87.3 80.0 - 100.0 fL   MCH 31.3 26.0 - 34.0 pg   MCHC 35.8 30.0 - 36.0 g/dL   RDW 56.2 (H) 13.0 - 86.5 %   Platelets 127 (L) 150 - 400 K/uL   nRBC 0.0 0.0 - 0.2 %  Basic metabolic panel with GFR     Status: Abnormal   Collection Time: 09/27/23  9:03 AM  Result Value Ref Range   Sodium 140 135 - 145 mmol/L   Potassium 4.7 3.5 - 5.1 mmol/L   Chloride 108 98 - 111 mmol/L   CO2 21 (L) 22 - 32 mmol/L   Glucose, Bld 187 (H) 70 - 99 mg/dL   BUN 26 (H) 8 - 23 mg/dL   Creatinine, Ser 7.84 (H) 0.44 - 1.00 mg/dL   Calcium 8.8 (L) 8.9 - 10.3 mg/dL   GFR, Estimated 33 (L) >60 mL/min   Anion gap 11 5 - 15   ECHOCARDIOGRAM COMPLETE Result Date: 09/26/2023    ECHOCARDIOGRAM REPORT   Patient Name:   Erica [name] Date of Exam: 09/26/2023 Medical Rec #:  [id number]    Height:       62.0 in Accession #:    [id number]   Weight:       159.0 lb Date of Birth:  1956-02-28    BSA:          1.734 m Patient Age:    68 years     BP:           140/87 mmHg Patient Gender: F            HR:           67 bpm. Exam Location:  Inpatient Procedure: 2D Echo, Cardiac Doppler, Color Doppler and Intracardiac  Opacification Agent (Both Spectral and Color Flow Doppler were            utilized during procedure). Indications:    Stroke  History:        Patient has prior history of Echocardiogram examinations, most                 recent 03/05/2023. COPD; Risk Factors:Hypertension, Diabetes,                 Dyslipidemia and Former Smoker.  Sonographer:    Karma Ganja Referring Phys: [id number] VISHAL R PATEL  Sonographer Comments: Technically difficult study due to poor echo windows and patient is obese. Image acquisition challenging due to COPD. IMPRESSIONS  1. Left ventricular ejection fraction, by estimation, is 55 to 60%. The left ventricle has normal  function. The left ventricle demonstrates regional wall motion abnormalities (see scoring diagram/findings for description). There is mild left ventricular  hypertrophy. Left ventricular diastolic parameters are consistent with Grade I diastolic dysfunction (impaired relaxation).  2. Right ventricular systolic function is normal. The right ventricular size is normal.  3. Left atrial size was mildly dilated.  4. The mitral valve is normal in structure. No evidence of mitral valve regurgitation. No evidence of mitral stenosis.  5. The aortic valve is calcified. There is moderate calcification of the aortic valve. There is moderate thickening of the aortic valve. Aortic valve regurgitation is mild. Aortic valve sclerosis is present, with no evidence of aortic valve stenosis. Aortic valve mean gradient measures 4.0 mmHg. Aortic valve Vmax measures 1.36 m/s.  6. The inferior vena cava is normal in size with greater than 50% respiratory variability, suggesting right atrial pressure of 3 mmHg. FINDINGS  Left Ventricle: Left ventricular ejection fraction, by estimation, is 55 to 60%. The left ventricle has normal function. The left ventricle demonstrates regional wall motion abnormalities. Definity contrast agent was given IV to delineate the left ventricular endocardial borders. The left ventricular internal cavity size was normal in size. There is mild left ventricular hypertrophy. Left ventricular diastolic parameters are consistent with Grade I diastolic dysfunction (impaired relaxation).  LV Wall Scoring: The basal inferior segment is akinetic. The basal inferoseptal segment is hypokinetic. Right Ventricle: The right ventricular size is normal. No increase in right ventricular wall thickness. Right ventricular systolic function is normal. Left Atrium: Left atrial size was mildly dilated. Right Atrium: Right atrial size was normal in size. Pericardium: There is no evidence of pericardial effusion. Mitral Valve: The  mitral valve is normal in structure. No evidence of mitral valve regurgitation. No evidence of mitral valve stenosis. Tricuspid Valve: The tricuspid valve is normal in structure. Tricuspid valve regurgitation is not demonstrated. No evidence of tricuspid stenosis. Aortic Valve: The aortic valve is calcified. There is moderate calcification of the aortic valve. There is moderate thickening of the aortic valve. Aortic valve regurgitation is mild. Aortic valve sclerosis is present, with no evidence of aortic valve stenosis. Aortic valve mean gradient measures 4.0 mmHg. Aortic valve peak gradient measures 7.4 mmHg. Aortic valve area, by VTI measures 1.89 cm. Pulmonic Valve: The pulmonic valve was normal in structure. Pulmonic valve regurgitation is not visualized. No evidence of pulmonic stenosis. Aorta: The aortic root is normal in size and structure. Venous: The inferior vena cava is normal in size with greater than 50% respiratory variability, suggesting right atrial pressure of 3 mmHg. IAS/Shunts: No atrial level shunt detected by color flow Doppler.  LEFT VENTRICLE PLAX 2D LVIDd:  3.90 cm   Diastology LVIDs:         2.50 cm   LV e' medial:    3.26 cm/s LV PW:         1.20 cm   LV E/e' medial:  16.2 LV IVS:        1.40 cm   LV e' lateral:   5.22 cm/s LVOT diam:     2.00 cm   LV E/e' lateral: 10.1 LV SV:         55 LV SV Index:   32 LVOT Area:     3.14 cm  RIGHT VENTRICLE             IVC RV Basal diam:  3.60 cm     IVC diam: 1.40 cm RV S prime:     10.60 cm/s TAPSE (M-mode): 2.9 cm LEFT ATRIUM             Index        RIGHT ATRIUM           Index LA diam:        3.80 cm 2.19 cm/m   RA Area:     16.10 cm LA Vol (A2C):   69.5 ml 40.08 ml/m  RA Volume:   39.40 ml  22.72 ml/m LA Vol (A4C):   44.8 ml 25.83 ml/m LA Biplane Vol: 55.8 ml 32.18 ml/m  AORTIC VALVE AV Area (Vmax):    1.82 cm AV Area (Vmean):   1.81 cm AV Area (VTI):     1.89 cm AV Vmax:           136.00 cm/s AV Vmean:          90.500 cm/s AV  VTI:            0.289 m AV Peak Grad:      7.4 mmHg AV Mean Grad:      4.0 mmHg LVOT Vmax:         79.00 cm/s LVOT Vmean:        52.200 cm/s LVOT VTI:          0.174 m LVOT/AV VTI ratio: 0.60  AORTA Ao Root diam: 3.20 cm Ao Asc diam:  3.10 cm MITRAL VALVE MV Area (PHT): 3.17 cm     SHUNTS MV Decel Time: 239 msec     Systemic VTI:  0.17 m MV E velocity: 52.80 cm/s   Systemic Diam: 2.00 cm MV A velocity: 102.00 cm/s MV E/A ratio:  0.52 Dorothye Gathers MD Electronically signed by Dorothye Gathers MD Signature Date/Time: 09/26/2023/2:46:16 PM    Final    MR BRAIN WO CONTRAST Result Date: 09/25/2023 CLINICAL DATA:  Neuro deficit, concern for stroke. Left-sided symptoms, left facial numbness, slurred speech. EXAM: MRI HEAD WITHOUT CONTRAST TECHNIQUE: Multiplanar, multiecho pulse sequences of the brain and surrounding structures were obtained without intravenous contrast. COMPARISON:  Same day CT head and CTA head and neck. MRI head 03/18/2023. FINDINGS: Brain: Punctate focus of restricted diffusion involving the left thalamus partially extending into the cerebral peduncle compatible with acute lacunar infarct. No additional areas of acute infarct pre shaded. Focal encephalomalacia in the right cerebral hemisphere compatible with remote infarcts. Mild associated susceptibility suggestive of prior hemorrhage. Additional small focus of susceptibility in the left middle frontal gyrus with encephalomalacia and gliosis compatible with remote infarct and remote hemorrhage. Additional remote infarcts in the right frontal lobe, bilateral occipital lobes, small remote infarcts in the left cerebellum, and remote lacunar infarcts in  the bilateral basal ganglia and bilateral thalami. Scattered and confluent FLAIR signal abnormality in the periventricular and subcortical white matter suggestive of moderate chronic microvascular ischemic changes. No midline shift. The basilar cisterns are patent. No extra-axial fluid collections. Ventricles:  Prominence of the lateral ventricles suggestive of underlying parenchymal volume loss. Vascular: Skull base flow voids are visualized. Skull and upper cervical spine: No focal abnormality. Sinuses/Orbits: Orbits are symmetric. Paranasal sinuses are clear. Other: Mastoid air cells are clear. IMPRESSION: Acute lacunar infarct in the left thalamus. Multiple additional remote infarcts as above, similar to 2024. Moderate chronic microvascular ischemic changes and mild parenchymal volume loss. These results were communicated to [facility] at 5:48 pm on 09/25/2023 by text page via the [facility] messaging system. Electronically Signed   By: Denny Flack M.D.   On: 09/25/2023 17:49   CT ANGIO HEAD NECK W WO CM (CODE STROKE) Result Date: 09/25/2023 CLINICAL DATA:  Provided history: Neuro deficit, acute, stroke suspected. Additional history obtained from electronic MEDICAL RECORD NUMBERFacial numbness, slurred speech. EXAM: CT ANGIOGRAPHY HEAD AND NECK WITH AND WITHOUT CONTRAST TECHNIQUE: Multidetector CT imaging of the head and neck was performed using the standard protocol during bolus administration of intravenous contrast. Multiplanar CT image reconstructions and MIPs were obtained to evaluate the vascular anatomy. Carotid stenosis measurements (when applicable) are obtained utilizing NASCET criteria, using the distal internal carotid diameter as the denominator. RADIATION DOSE REDUCTION: This exam was performed according to the departmental dose-optimization program which includes automated exposure control, adjustment of the mA and/or kV according to patient size and/or use of iterative reconstruction technique. CONTRAST:  75mL OMNIPAQUE IOHEXOL 300 MG/ML  SOLN COMPARISON:  Non-contrast head CT performed earlier today 09/25/2023. CT angiogram head/neck 03/18/2023. FINDINGS: CTA NECK FINDINGS Aortic arch: Standard aortic branching. The visualized thoracic aorta is normal in caliber. Streak/beam hardening artifact arising from  a dense contrast bolus partially obscures the left subclavian artery. Within this limitation, there is no appreciable hemodynamically significant innominate or proximal subclavian artery stenosis. Right carotid system: CCA and ICA patent within the neck without stenosis or significant atherosclerotic disease. No evidence of dissection. Left carotid system: Beam hardening artifact arising from a dense contrast bolus partially obscures the proximal CCA. Within this limitation, the CCA and ICA are patent within the neck without stenosis or significant atherosclerotic disease. No evidence of dissection. Vertebral arteries: Codominant and patent within the neck without stenosis or significant atherosclerotic disease. No evidence of dissection. Skeleton: Spondylosis at the cervical and visualized upper thoracic levels. No acute fracture or aggressive osseous lesion. Other neck: No neck mass or cervical lymphadenopathy. Upper chest: No consolidation within the imaged lung apices. Centrilobular paraseptal emphysema. Incompletely assessed fibrotic lung disease. Review of the MIP images confirms the above findings CTA HEAD FINDINGS Anterior circulation: The intracranial internal carotid arteries are patent. Atherosclerotic plaque within both vessels with no more than mild stenosis. The M1 middle cerebral arteries are patent. No M2 proximal branch occlusion or high-grade proximal stenosis. The anterior cerebral arteries are patent. No intracranial aneurysm is identified. Posterior circulation: The intracranial vertebral arteries are patent. The basilar artery is patent. 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 The posterior cerebral arteries are patent. Posterior communicating arteries are diminutive or absent, bilaterally. Venous sinuses: Within the limitations of contrast timing, no convincing thrombus. Anatomic variants: As described. Review of the MIP images confirms the above findings No  emergent large vessel occlusion identified. These results were communicated to [facility] at 12:10 pmon 4/14/2025by text  page via the [facility] messaging system. IMPRESSION: CTA neck: 1. Artifact arising from a dense contrast bolus partially obscures the proximal left common carotid artery. Within this limitation, the common carotid, internal carotid and vertebral arteries are patent within the neck without stenosis or significant atherosclerotic disease. No evidence of dissection. 2. Incompletely assessed fibrotic lung disease. 3. Emphysema (ICD10-J43.9). CTA head: 1. No proximal intracranial large vessel occlusion or high-grade proximal arterial stenosis identified. 2. Atherosclerotic plaque within the intracranial internal carotid arteries with no more than mild stenosis. Electronically Signed   By: Jackey Loge D.O.   On: 09/25/2023 12:10   CT HEAD CODE STROKE WO CONTRAST Result Date: 09/25/2023 CLINICAL DATA:  Code stroke. EXAM: CT HEAD WITHOUT CONTRAST TECHNIQUE: Contiguous axial images were obtained from the base of the skull through the vertex without intravenous contrast. RADIATION DOSE REDUCTION: This exam was performed according to the departmental dose-optimization program which includes automated exposure control, adjustment of the mA and/or kV according to patient size and/or use of iterative reconstruction technique. COMPARISON:  06/09/2023 FINDINGS: Brain: Scattered areas of gray-white differentiation loss in the bilateral occipital parietal cortex and in the anterior and superior frontal lobes. Small chronic infarcts in the bilateral cerebellum. Chronic lacunar infarcts in the bilateral thalamus and caudate. Ischemic gliosis is present in the bilateral cerebral white matter. No evidence of acute infarct, hemorrhage, hydrocephalus, or mass. Vascular: No hyperdense vessel or unexpected calcification. Skull: Normal. Negative for fracture or focal lesion. Sinuses/Orbits: No acute finding. IMPRESSION:  1. No acute finding. 2. Numerous small cortical, cerebellar, and lacunar infarcts as seen on head CT 06/09/2023. Electronically Signed   By: Tiburcio Pea M.D.   On: 09/25/2023 11:34    Assessment/Plan: Diagnosis:  Left thalamic infarct  Does the need for close, 24 hr/day medical supervision in concert with the patient's rehab needs make it unreasonable for this patient to be served in a less intensive setting? Yes Co-Morbidities requiring supervision/potential complications:  - Prior CVA, insulin-dependent type 2 diabetes, hypertension, hyperlipidemia, COPD, depression, CKD 3B, pulmonary fibrosis, Due to bladder management, bowel management, safety, skin/wound care, disease management, medication administration, pain management, and patient education, does the patient require 24 hr/day rehab nursing? Yes Does the patient require coordinated care of a physician, rehab nurse, therapy disciplines of PT/OT/SLP to address physical and functional deficits in the context of the above medical diagnosis(es)? Yes Addressing deficits in the following areas: balance, endurance, locomotion, strength, transferring, bowel/bladder control, bathing, dressing, feeding, grooming, toileting, cognition, speech, language, and psychosocial support Can the patient actively participate in an intensive therapy program of at least 3 hrs of therapy per day at least 5 days per week? Yes The potential for patient to make measurable gains while on inpatient rehab is excellent Anticipated functional outcomes upon discharge from inpatient rehab are modified independent and supervision  with PT, modified independent and supervision with OT, modified independent with SLP. Estimated rehab length of stay to reach the above functional goals is: 7 days Anticipated discharge destination: Home Overall Rehab/Functional Prognosis: excellent  POST ACUTE RECOMMENDATIONS: This patient's condition is appropriate for continued rehabilitative  care in the following setting: CIR Patient has agreed to participate in recommended program. Yes Note that insurance prior authorization may be required for reimbursement for recommended care.  Comment: I think she is a good candidate for CIR, would like to confirm disposition. Rehab coordinator to follow up.     Thanks,  Fanny Dance, MD 09/27/2023

## 2023-09-27 NOTE — Plan of Care (Signed)
  Problem: Coping: Goal: Will verbalize positive feelings about self Outcome: Progressing Goal: Will identify appropriate support needs Outcome: Progressing   Problem: Health Behavior/Discharge Planning: Goal: Ability to manage health-related needs will improve Outcome: Progressing Goal: Goals will be collaboratively established with patient/family Outcome: Progressing   Problem: Self-Care: Goal: Ability to participate in self-care as condition permits will improve Outcome: Progressing

## 2023-09-27 NOTE — Progress Notes (Addendum)
 PROGRESS NOTE    Erica [name]  WUJ:[id number] DOB: 03/31/1956 DOA: 09/25/2023 PCP: Estevan Oaks, NP   Brief Narrative:   Erica [name] is a 68 y.o. 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  Assessment & Plan:   Principal Problem:   Acute lacunar infarction in the left thalamus [facility]) Active Problems:   History of CVA with residual deficit   Type 2 diabetes mellitus with hypoglycemia, with long-term current use of insulin (HCC)   Hypertension associated with diabetes (HCC)   COPD (chronic obstructive pulmonary disease) (HCC)   Idiopathic pulmonary fibrosis (HCC)   Acute CVA (cerebrovascular accident) (HCC)   Chronic kidney disease, stage 3b (HCC)   Hyperlipidemia associated with type 2 diabetes mellitus (HCC)   Acute lacunar infarct of left thalamus History of recurrent stroke: - Right facial droop and tongue numbness consistent with MRI brain confirming acute lacunar infarct of the left thalamus - CTA head and neck negative for large vessel disease or high-grade stenosis - Echo EF 55 to 60% with notable left ventricle wall motion abnormalities left ventricular hypertrophy and grade 1 diastolic dysfunction without notable shunt - A1c within normal limits, lipid panel remarkable for elevated triglycerides and VLDL -Continue telemetry per protocol, patient with loop recorder, will await an update from EP about interrogation. - Patient is on aspirin at home, currently on aspirin and Plavix for 3 weeks, then Plavix alone .P2Y12 showed 161 though level, seems responding to plavix.  recommendation to continue Plavix per neurology. - PT OT speech following per protocol, awaiting CIR evaluation  Addendum: - EP has reviewed  patient's loop recorder, there is no true A-fib episodes, some readings labeled as "A-fib", but are false, as well none of these even since December, as well some readings labeled "asystole", again these are false. there is a noisy signal on the 14th , suspect is while she was in the MRI scanner, happened about 15:00    Insulin-dependent type 2 diabetes uncontrolled with hypoglycemia: -A1c low, concerning for tight control - Notably hypoglycemic in the ED at intake requiring D50 - Continue sliding scale insulin hypoglycemia protocol, home standing insulin held would likely benefit from evaluation of home insulin regimen given A1c 5.6  Hypertension: - Long-term goal is normotensive, avoid low blood pressures, but overall blood pressure is stable   COPD/idiopathic pulmonary fibrosis: Stable.  Continue pirfenidone, Symbicort, and albuterol as needed.   CKD stage IIIb: Renal function at baseline   Hyperlipidemia: Continue rosuvastatin 40 mg oral daily, Zetia was added as LDL is 78   Depression/anxiety: Continue Zoloft and Effexor XR.  DVT prophylaxis: heparin injection 5,000 Units Start: 09/25/23 2200 Code Status:   Code Status: Do not attempt resuscitation (DNR) PRE-ARREST INTERVENTIONS DESIRED Family Communication: None at bedside  Status is: Inpatient  Dispo: The patient is from: Home              Anticipated d/c is to: To be determined              Anticipated d/c date is: 1 to 2 days, hopefully to CIR.              Patient currently not medically stable for discharge given ongoing evaluation with PT OT speech and neurology  Consultants:  Neurology  Procedures:  None planned  Antimicrobials:  None indicated  Subjective: No acute issues or events overnight, reports no significant change in tingling and numbness in her face  Objective: Vitals:   09/26/23 1947 09/26/23 2333 09/27/23 0443 09/27/23 0800  BP: 137/81 (!) 141/75 (!) 150/90   Pulse: 67 67  65  Resp: 16   14   Temp: 98.6 F (37 C) (!) 97.5 F (36.4 C) (!) 97.5 F (36.4 C)   TempSrc: Oral Oral Oral   SpO2: 100% 100%  100%  Weight:      Height:        Intake/Output Summary (Last 24 hours) at 09/27/2023 1109 Last data filed at 09/27/2023 0906 Gross per 24 hour  Intake 120 ml  Output --  Net 120 ml   Filed Weights   09/26/23 0720  Weight: 72.1 kg    Examination:  Awake Alert, Oriented X 3, talking slow, but no aphasia, midline, mild left dysmetria. Symmetrical Chest wall movement, Good air movement bilaterally, CTAB RRR,No Gallops,Rubs or new Murmurs, No Parasternal Heave +ve B.Sounds, Abd Soft, No tenderness, No rebound - guarding or rigidity. No Cyanosis, Clubbing or edema, No new Rash or bruise     Data Reviewed: I have personally reviewed following labs and imaging studies  CBC: Recent Labs  Lab 09/25/23 1126 09/25/23 1130 09/26/23 0340 09/27/23 0903  WBC 5.0  --  4.5 4.8  NEUTROABS 3.5  --   --   --   HGB 12.0 11.2* 11.7* 12.1  HCT 34.2* 33.0* 33.0* 33.8*  MCV 88.8  --  88.7 87.3  PLT 157  --  126* 127*   Basic Metabolic Panel: Recent Labs  Lab 09/25/23 1130 09/25/23 1330 09/26/23 0340 09/27/23 0903  NA 137 135 141 140  K 6.8* 5.0 4.8 4.7  CL 112* 109 112* 108  CO2  --  20* 22 21*  GLUCOSE 83 171* 138* 187*  BUN 55* 34* 32* 26*  CREATININE 2.00* 1.62* 1.65* 1.67*  CALCIUM  --  8.2* 8.4* 8.8*   GFR: Estimated Creatinine Clearance: 29.6 mL/min (A) (by C-G formula based on SCr of 1.67 mg/dL (H)).  Liver Function Tests: Recent Labs  Lab 09/25/23 1330  AST 21  ALT 12  ALKPHOS 89  BILITOT 0.5  PROT 6.2*  ALBUMIN 3.1*   Coagulation Profile: Recent Labs  Lab 09/25/23 1126  INR 1.1   HbA1C: Recent Labs    09/26/23 0340  HGBA1C 5.6   CBG: Recent Labs  Lab 09/26/23 0855 09/26/23 1152 09/26/23 1633 09/26/23 1943 09/27/23 0749  GLUCAP 86 123* 127* 206* 203*   Lipid Profile: Recent Labs    09/26/23 0341  CHOL 178  HDL 54  LDLCALC  78  TRIG 161*  CHOLHDL 3.3   No results found for this or any previous visit (from the past 240 hours).   Radiology Studies: ECHOCARDIOGRAM COMPLETE Result Date: 09/26/2023    ECHOCARDIOGRAM REPORT   Patient Name:   Erica [name] Date of Exam: 09/26/2023 Medical Rec #:  [id number]    Height:       62.0 in Accession #:    [id number]   Weight:       159.0 lb Date of Birth:  1955/07/05    BSA:          1.734 m Patient Age:    68 years     BP:           140/87 mmHg Patient Gender: F  HR:           67 bpm. Exam Location:  Inpatient Procedure: 2D Echo, Cardiac Doppler, Color Doppler and Intracardiac            Opacification Agent (Both Spectral and Color Flow Doppler were            utilized during procedure). Indications:    Stroke  History:        Patient has prior history of Echocardiogram examinations, most                 recent 03/05/2023. COPD; Risk Factors:Hypertension, Diabetes,                 Dyslipidemia and Former Smoker.  Sonographer:    Reta Cassis Referring Phys: [id number] VISHAL R PATEL  Sonographer Comments: Technically difficult study due to poor echo windows and patient is obese. Image acquisition challenging due to COPD. IMPRESSIONS  1. Left ventricular ejection fraction, by estimation, is 55 to 60%. The left ventricle has normal function. The left ventricle demonstrates regional wall motion abnormalities (see scoring diagram/findings for description). There is mild left ventricular  hypertrophy. Left ventricular diastolic parameters are consistent with Grade I diastolic dysfunction (impaired relaxation).  2. Right ventricular systolic function is normal. The right ventricular size is normal.  3. Left atrial size was mildly dilated.  4. The mitral valve is normal in structure. No evidence of mitral valve regurgitation. No evidence of mitral stenosis.  5. The aortic valve is calcified. There is moderate calcification of the aortic valve. There is moderate thickening of the aortic valve.  Aortic valve regurgitation is mild. Aortic valve sclerosis is present, with no evidence of aortic valve stenosis. Aortic valve mean gradient measures 4.0 mmHg. Aortic valve Vmax measures 1.36 m/s.  6. The inferior vena cava is normal in size with greater than 50% respiratory variability, suggesting right atrial pressure of 3 mmHg. FINDINGS  Left Ventricle: Left ventricular ejection fraction, by estimation, is 55 to 60%. The left ventricle has normal function. The left ventricle demonstrates regional wall motion abnormalities. Definity contrast agent was given IV to delineate the left ventricular endocardial borders. The left ventricular internal cavity size was normal in size. There is mild left ventricular hypertrophy. Left ventricular diastolic parameters are consistent with Grade I diastolic dysfunction (impaired relaxation).  LV Wall Scoring: The basal inferior segment is akinetic. The basal inferoseptal segment is hypokinetic. Right Ventricle: The right ventricular size is normal. No increase in right ventricular wall thickness. Right ventricular systolic function is normal. Left Atrium: Left atrial size was mildly dilated. Right Atrium: Right atrial size was normal in size. Pericardium: There is no evidence of pericardial effusion. Mitral Valve: The mitral valve is normal in structure. No evidence of mitral valve regurgitation. No evidence of mitral valve stenosis. Tricuspid Valve: The tricuspid valve is normal in structure. Tricuspid valve regurgitation is not demonstrated. No evidence of tricuspid stenosis. Aortic Valve: The aortic valve is calcified. There is moderate calcification of the aortic valve. There is moderate thickening of the aortic valve. Aortic valve regurgitation is mild. Aortic valve sclerosis is present, with no evidence of aortic valve stenosis. Aortic valve mean gradient measures 4.0 mmHg. Aortic valve peak gradient measures 7.4 mmHg. Aortic valve area, by VTI measures 1.89 cm. Pulmonic  Valve: The pulmonic valve was normal in structure. Pulmonic valve regurgitation is not visualized. No evidence of pulmonic stenosis. Aorta: The aortic root is normal in size and structure. Venous: The inferior vena cava  is normal in size with greater than 50% respiratory variability, suggesting right atrial pressure of 3 mmHg. IAS/Shunts: No atrial level shunt detected by color flow Doppler.  LEFT VENTRICLE PLAX 2D LVIDd:         3.90 cm   Diastology LVIDs:         2.50 cm   LV e' medial:    3.26 cm/s LV PW:         1.20 cm   LV E/e' medial:  16.2 LV IVS:        1.40 cm   LV e' lateral:   5.22 cm/s LVOT diam:     2.00 cm   LV E/e' lateral: 10.1 LV SV:         55 LV SV Index:   32 LVOT Area:     3.14 cm  RIGHT VENTRICLE             IVC RV Basal diam:  3.60 cm     IVC diam: 1.40 cm RV S prime:     10.60 cm/s TAPSE (M-mode): 2.9 cm LEFT ATRIUM             Index        RIGHT ATRIUM           Index LA diam:        3.80 cm 2.19 cm/m   RA Area:     16.10 cm LA Vol (A2C):   69.5 ml 40.08 ml/m  RA Volume:   39.40 ml  22.72 ml/m LA Vol (A4C):   44.8 ml 25.83 ml/m LA Biplane Vol: 55.8 ml 32.18 ml/m  AORTIC VALVE AV Area (Vmax):    1.82 cm AV Area (Vmean):   1.81 cm AV Area (VTI):     1.89 cm AV Vmax:           136.00 cm/s AV Vmean:          90.500 cm/s AV VTI:            0.289 m AV Peak Grad:      7.4 mmHg AV Mean Grad:      4.0 mmHg LVOT Vmax:         79.00 cm/s LVOT Vmean:        52.200 cm/s LVOT VTI:          0.174 m LVOT/AV VTI ratio: 0.60  AORTA Ao Root diam: 3.20 cm Ao Asc diam:  3.10 cm MITRAL VALVE MV Area (PHT): 3.17 cm     SHUNTS MV Decel Time: 239 msec     Systemic VTI:  0.17 m MV E velocity: 52.80 cm/s   Systemic Diam: 2.00 cm MV A velocity: 102.00 cm/s MV E/A ratio:  0.52 Erica Gathers MD Electronically signed by Erica Gathers MD Signature Date/Time: 09/26/2023/2:46:16 PM    Final    MR BRAIN WO CONTRAST Result Date: 09/25/2023 CLINICAL DATA:  Neuro deficit, concern for stroke. Left-sided symptoms, left  facial numbness, slurred speech. EXAM: MRI HEAD WITHOUT CONTRAST TECHNIQUE: Multiplanar, multiecho pulse sequences of the brain and surrounding structures were obtained without intravenous contrast. COMPARISON:  Same day CT head and CTA head and neck. MRI head 03/18/2023. FINDINGS: Brain: Punctate focus of restricted diffusion involving the left thalamus partially extending into the cerebral peduncle compatible with acute lacunar infarct. No additional areas of acute infarct pre shaded. Focal encephalomalacia in the right cerebral hemisphere compatible with remote infarcts. Mild associated susceptibility suggestive of prior hemorrhage. Additional small focus of  susceptibility in the left middle frontal gyrus with encephalomalacia and gliosis compatible with remote infarct and remote hemorrhage. Additional remote infarcts in the right frontal lobe, bilateral occipital lobes, small remote infarcts in the left cerebellum, and remote lacunar infarcts in the bilateral basal ganglia and bilateral thalami. Scattered and confluent FLAIR signal abnormality in the periventricular and subcortical white matter suggestive of moderate chronic microvascular ischemic changes. No midline shift. The basilar cisterns are patent. No extra-axial fluid collections. Ventricles: Prominence of the lateral ventricles suggestive of underlying parenchymal volume loss. Vascular: Skull base flow voids are visualized. Skull and upper cervical spine: No focal abnormality. Sinuses/Orbits: Orbits are symmetric. Paranasal sinuses are clear. Other: Mastoid air cells are clear. IMPRESSION: Acute lacunar infarct in the left thalamus. Multiple additional remote infarcts as above, similar to 2024. Moderate chronic microvascular ischemic changes and mild parenchymal volume loss. These results were communicated to [facility] at 5:48 pm on 09/25/2023 by text page via the [facility] messaging system. Electronically Signed   By: Denny Flack M.D.   On: 09/25/2023  17:49   CT ANGIO HEAD NECK W WO CM (CODE STROKE) Result Date: 09/25/2023 CLINICAL DATA:  Provided history: Neuro deficit, acute, stroke suspected. Additional history obtained from electronic MEDICAL RECORD NUMBERFacial numbness, slurred speech. EXAM: CT ANGIOGRAPHY HEAD AND NECK WITH AND WITHOUT CONTRAST TECHNIQUE: Multidetector CT imaging of the head and neck was performed using the standard protocol during bolus administration of intravenous contrast. Multiplanar CT image reconstructions and MIPs were obtained to evaluate the vascular anatomy. Carotid stenosis measurements (when applicable) are obtained utilizing NASCET criteria, using the distal internal carotid diameter as the denominator. RADIATION DOSE REDUCTION: This exam was performed according to the departmental dose-optimization program which includes automated exposure control, adjustment of the mA and/or kV according to patient size and/or use of iterative reconstruction technique. CONTRAST:  75mL OMNIPAQUE IOHEXOL 300 MG/ML  SOLN COMPARISON:  Non-contrast head CT performed earlier today 09/25/2023. CT angiogram head/neck 03/18/2023. FINDINGS: CTA NECK FINDINGS Aortic arch: Standard aortic branching. The visualized thoracic aorta is normal in caliber. Streak/beam hardening artifact arising from a dense contrast bolus partially obscures the left subclavian artery. Within this limitation, there is no appreciable hemodynamically significant innominate or proximal subclavian artery stenosis. Right carotid system: CCA and ICA patent within the neck without stenosis or significant atherosclerotic disease. No evidence of dissection. Left carotid system: Beam hardening artifact arising from a dense contrast bolus partially obscures the proximal CCA. Within this limitation, the CCA and ICA are patent within the neck without stenosis or significant atherosclerotic disease. No evidence of dissection. Vertebral arteries: Codominant and patent within the neck without  stenosis or significant atherosclerotic disease. No evidence of dissection. Skeleton: Spondylosis at the cervical and visualized upper thoracic levels. No acute fracture or aggressive osseous lesion. Other neck: No neck mass or cervical lymphadenopathy. Upper chest: No consolidation within the imaged lung apices. Centrilobular paraseptal emphysema. Incompletely assessed fibrotic lung disease. Review of the MIP images confirms the above findings CTA HEAD FINDINGS Anterior circulation: The intracranial internal carotid arteries are patent. Atherosclerotic plaque within both vessels with no more than mild stenosis. The M1 middle cerebral arteries are patent. No M2 proximal branch occlusion or high-grade proximal stenosis. The anterior cerebral arteries are patent. No intracranial aneurysm is identified. Posterior circulation: The intracranial vertebral arteries are patent. The basilar artery is patent. 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 The posterior cerebral arteries are patent. Posterior communicating arteries are diminutive or absent, bilaterally. Venous sinuses:  Within the limitations of contrast timing, no convincing thrombus. Anatomic variants: As described. Review of the MIP images confirms the above findings No emergent large vessel occlusion identified. These results were communicated to [employer] at 12:10 pmon 4/14/2025by text page via the [facility] messaging system. IMPRESSION: CTA neck: 1. Artifact arising from a dense contrast bolus partially obscures the proximal left common carotid artery. Within this limitation, the common carotid, internal carotid and vertebral arteries are patent within the neck without stenosis or significant atherosclerotic disease. No evidence of dissection. 2. Incompletely assessed fibrotic lung disease. 3. Emphysema (ICD10-J43.9). CTA head: 1. No proximal intracranial large vessel occlusion or high-grade proximal arterial stenosis identified. 2.  Atherosclerotic plaque within the intracranial internal carotid arteries with no more than mild stenosis. Electronically Signed   By: Bascom Lily D.O.   On: 09/25/2023 12:10   CT HEAD CODE STROKE WO CONTRAST Result Date: 09/25/2023 CLINICAL DATA:  Code stroke. EXAM: CT HEAD WITHOUT CONTRAST TECHNIQUE: Contiguous axial images were obtained from the base of the skull through the vertex without intravenous contrast. RADIATION DOSE REDUCTION: This exam was performed according to the departmental dose-optimization program which includes automated exposure control, adjustment of the mA and/or kV according to patient size and/or use of iterative reconstruction technique. COMPARISON:  06/09/2023 FINDINGS: Brain: Scattered areas of gray-white differentiation loss in the bilateral occipital parietal cortex and in the anterior and superior frontal lobes. Small chronic infarcts in the bilateral cerebellum. Chronic lacunar infarcts in the bilateral thalamus and caudate. Ischemic gliosis is present in the bilateral cerebral white matter. No evidence of acute infarct, hemorrhage, hydrocephalus, or mass. Vascular: No hyperdense vessel or unexpected calcification. Skull: Normal. Negative for fracture or focal lesion. Sinuses/Orbits: No acute finding. IMPRESSION: 1. No acute finding. 2. Numerous small cortical, cerebellar, and lacunar infarcts as seen on head CT 06/09/2023. Electronically Signed   By: Ronnette Coke M.D.   On: 09/25/2023 11:34        Scheduled Meds:  aspirin EC  81 mg Oral Daily   clopidogrel  75 mg Oral Daily   ezetimibe  10 mg Oral Daily   heparin  5,000 Units Subcutaneous Q8H   insulin aspart  0-9 Units Subcutaneous TID WC   mometasone-formoterol  2 puff Inhalation BID   pantoprazole  40 mg Oral Daily   Pirfenidone  1 tablet Oral TID WC   rosuvastatin  40 mg Oral Daily   sertraline  50 mg Oral Daily   venlafaxine XR  150 mg Oral Daily   Continuous Infusions:   LOS: 1 day    Seena Dadds, MD [facility]  If 7PM-7AM, please contact night-coverage [email or web address]  09/27/2023, 11:09 AM

## 2023-09-27 NOTE — PMR Pre-admission (Signed)
 PMR Admission Coordinator Pre-Admission Assessment  Patient: Erica [name] is an 68 y.o., female MRN: [id number] DOB: 1955/06/14 Height: 5\' 2"  (157.5 cm) Weight: 72.1 kg  Insurance Information HMO: yes    PPO:      PCP:      IPA:      80/20:      OTHER:  PRIMARY: [employer] Dual complete       Policy#: [id number]      Subscriber: pt CM Name: Marylene Land      Phone#: [phone]     Fax#: [phone] Pre-Cert#: [id number] approved 09/28/23 to 10/04/23, review due on 10/04/23      Employer: Retired Benefits:  Phone #: [phone]     Name: Verified on line Eff. Date: 09/12/23-06/12/24       Deduct: $ 257      Out of Pocket Max: $9,350 ($0 met)      Life Max: n/a   CIR: $1,565 copay per admission      SNF: $0 for days 1-20, $209.50 per day for days 21-100 Outpatient: 80%     Co-Pay: 20% Home Health: 100%      Co-Pay: none DME: 80%     Co-Pay: 20% Providers: in network    SECONDARY: Medicaid of [place]       Policy#: [id number]     Phone#: [phone]  Financial Counselor:       Phone#:   The "Data Collection Information Summary" for patients in Inpatient Rehabilitation Facilities with attached "Privacy Act Statement-Health Care Records" was provided and verbally reviewed with: Pt   Emergency Contact Information Contact Information     Name Relation Home Work Lytle Son [phone]        Other Contacts     Name Relation Home Work Mobile   Victoria Sister [phone]     Jeffries,Crystal Niece   [phone]       Current Medical History  Patient Admitting Diagnosis: L CVA   History of Present Illness: Erica [name] is a 68 y.o. female with past medical history of hypertension, depression, diabetes, COPD, prior strokes, rectal cancer who was admitted to [facility] ED on 09/25/23 with left thalamic CVA after she developed right facial and tongue numbness and altered speech.  Patient was seen by neurology and aspirin 81 mg was added to Plavix 75 mg.   She had a MRI on 09/25/2023 that showed acute lacunar infarct of the left thalamus, multiple additional remote strokes and chronic moderate microvascular ischemic changes.  Zetia was added to Crestor.  Patient was treated in CIR last year also after CVA.  She has been seen by speech therapy for impaired speech and cognition.  Patient was seen by physical therapy with deficits in the min assist/mod assist level with CIR recommended.  Complete NIHSS TOTAL: 0  Patient's medical [facility] from [facility]  has been reviewed by the rehabilitation admission coordinator and physician.  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 No  Family History   family history includes Cancer in her maternal aunt, maternal uncle, maternal uncle, maternal uncle, paternal aunt, and paternal uncle; Cancer (age of onset: 48) in an other family member; Diabetes in her father; Heart disease in her mother; Hyperlipidemia in her mother; Hypertension in her brother, brother, father, mother, sister, and sister; Stroke in her father.  Current Medications  Current Facility-Administered Medications:    acetaminophen (TYLENOL) tablet 650 mg, 650 mg, Oral, Q4H PRN, 650 mg at 09/27/23 1125 **OR**  acetaminophen (TYLENOL) 160 MG/5ML solution 650 mg, 650 mg, Per Tube, Q4H PRN **OR** acetaminophen (TYLENOL) suppository 650 mg, 650 mg, Rectal, Q4H PRN, Lydia Sams, Vishal R, MD   albuterol (PROVENTIL) (2.5 MG/3ML) 0.083% nebulizer solution 3 mL, 3 mL, Inhalation, Q4H PRN, Patel, Vishal R, MD   aspirin EC tablet 81 mg, 81 mg, Oral, Daily, Patel, Vishal R, MD, 81 mg at 09/28/23 0925   clopidogrel (PLAVIX) tablet 75 mg, 75 mg, Oral, Daily, Patel, Vishal R, MD, 75 mg at 09/28/23 0623   ezetimibe (ZETIA) tablet 10 mg, 10 mg, Oral, Daily, Consuelo Denmark, MD, 10 mg at 09/28/23 7628   gabapentin (NEURONTIN) capsule 100 mg, 100 mg, Oral, Daily PRN, Patel, Vishal R, MD   heparin injection 5,000 Units, 5,000 Units, Subcutaneous, Q8H, Edith Gores R, MD, 5,000 Units at 09/28/23 3151   hydrALAZINE (APRESOLINE) injection 10 mg, 10 mg, Intravenous, Q4H PRN, Patel, Vishal R, MD   hydrocortisone cream 0.5 %, , Topical, TID, Elgergawy, Ardia Kraft, MD, Given at 09/28/23 0928   insulin aspart (novoLOG) injection 0-6 Units, 0-6 Units, Subcutaneous, TID WC, Elgergawy, Ardia Kraft, MD   mometasone-formoterol (DULERA) 200-5 MCG/ACT inhaler 2 puff, 2 puff, Inhalation, BID, Edith Gores R, MD, 2 puff at 09/28/23 0744   ondansetron (ZOFRAN) injection 4 mg, 4 mg, Intravenous, Q6H PRN, Lydia Sams, Vishal R, MD   pantoprazole (PROTONIX) EC tablet 40 mg, 40 mg, Oral, Daily, Patel, Vishal R, MD, 40 mg at 09/28/23 7616   Pirfenidone TABS 801 mg, 1 tablet, Oral, TID WC, Patel, Vishal R, MD   rosuvastatin (CRESTOR) tablet 40 mg, 40 mg, Oral, Daily, Lydia Sams, Vishal R, MD, 40 mg at 09/28/23 0737   senna-docusate (Senokot-S) tablet 1 tablet, 1 tablet, Oral, QHS PRN, Patel, Vishal R, MD   sertraline (ZOLOFT) tablet 50 mg, 50 mg, Oral, Daily, Lydia Sams, Vishal R, MD, 50 mg at 09/28/23 0924   venlafaxine XR (EFFEXOR-XR) 24 hr capsule 150 mg, 150 mg, Oral, Daily, Patel, Vishal R, MD, 150 mg at 09/28/23 1062  Patients Current Diet:  Diet Order              Diet - low sodium heart healthy           Diet heart healthy/carb modified Fluid consistency: Thin  Diet effective now                   Precautions / Restrictions Precautions Precautions: Fall Restrictions Weight Bearing Restrictions Per Provider Order: No   Has the patient had 2 or more falls or a fall with injury in the past year? No  Prior  Activity Level Community (5-7x/wk): Pt. active in the community PTA  Prior Functional Level Self Care: Did the patient need help bathing, dressing, using the toilet or eating? Independent  Indoor Mobility: Did the patient need assistance with walking from room to room (with or without device)? Independent  Stairs: Did the patient need assistance with internal or external stairs (with or without device)? Independent  Functional Cognition: Did the patient need help planning regular tasks such as shopping or remembering to take medications? Needed some help  Patient Information Are you of Hispanic, Latino/a,or Spanish origin?: A. No, not of Hispanic, Latino/a, or Spanish origin What is your race?: B. Black or African American Do you need or want an interpreter to communicate with a doctor or health care staff?: 0. No  Patient's Response To:  Health Literacy and Transportation Is the patient able to respond to health literacy and transportation needs?: Yes Health Literacy - How often do you need to have someone help you when you read instructions, pamphlets, or other written material from your doctor or pharmacy?: Never In the past 12 months, has lack of transportation kept you from medical appointments or from getting medications?: No In the past 12 months, has lack of transportation kept you from meetings, work, or from getting things needed for daily living?: No  Journalist, newspaper / Equipment Home Equipment: Jeananne Mighty - single point, Information systems manager, Agricultural consultant (2 wheels)  Prior Device Use: Indicate devices/aids used by the patient  prior to current illness, exacerbation or injury? Walker  Current Functional Level Cognition  Arousal/Alertness: Awake/alert Overall Cognitive Status: Impaired/Different from baseline Orientation Level: Oriented X4 Attention: Sustained Sustained Attention: Impaired Sustained Attention Impairment: Verbal basic, Functional basic Memory: Impaired Memory Impairment: Storage deficit, Retrieval deficit Awareness: Appears intact Problem Solving: Impaired Problem Solving Impairment: Verbal basic, Functional basic Safety/Judgment: Appears intact    Extremity Assessment (includes Sensation/Coordination)  Upper Extremity Assessment: LUE deficits/detail LUE Deficits / Details: overall good strength/ROM, increased time for motor planning LUE Coordination: decreased fine motor  Lower Extremity Assessment: Defer to PT evaluation RLE Deficits / Details: AROM WFL and strength grossly 4/5 LLE Deficits / Details: AROM A for full knee flexion, strength grossly 4-/5, ankle DF 3-/5    ADLs  Overall ADL's : Needs assistance/impaired Eating/Feeding: Modified independent, Sitting Grooming: Set up, Sitting Upper Body Bathing: Set up, Sitting Lower Body Bathing: Minimal assistance, Sitting/lateral leans Upper Body Dressing : Set up, Sitting Lower Body Dressing: Minimal assistance, Sit to/from stand Toilet Transfer: Minimal assistance, Stand-pivot, Rolling walker (2 wheels), BSC/3in1 Toileting- Clothing Manipulation and Hygiene: Minimal assistance, Sit to/from stand General ADL Comments: Pt min A for sit to stand using RW and transfer to [facility]. Pt min A for LB dressing/bathing, instructed on use of sock aide, increased time to use.    Mobility  Overal bed mobility: Needs Assistance Bed Mobility: Supine to Sit, Sit to Supine Supine to sit: Min assist, HOB elevated Sit to supine: Min assist General bed mobility comments: min A in/out of bed    Transfers  Overall transfer level: Needs  assistance Equipment used: Rolling walker (2 wheels) Transfers: Sit to/from Stand, Bed to chair/wheelchair/BSC Sit to Stand: Min assist Bed to/from chair/wheelchair/BSC transfer type:: Step pivot Step pivot transfers: Min assist General transfer comment: min A for STS, left buckled a little at times with pt using RW    Ambulation / Gait / Stairs / Wheelchair Mobility  Ambulation/Gait Ambulation/Gait assistance: Editor, commissioning (Feet): 20 Feet (20 feet x  2) Assistive device: Rolling walker (2 wheels) Gait Pattern/deviations: Step-to pattern, Step-through pattern, Decreased stride length, Trunk flexed, Decreased dorsiflexion - left, Knee flexed in stance - left General Gait Details: slow pace, increased L knee flexion in stance with distance, reported feeling light headed, though BP stable from supine to sitting; reported headache that started with ambulation. Only able to ambulate to bathroom and back. Needed assist for cleaning and pulling Depends down and back up. Gait velocity interpretation: <1.31 ft/sec, indicative of household ambulator    Posture / Balance Balance Overall balance assessment: Needs assistance Sitting-balance support: No upper extremity supported, Feet supported Sitting balance-Leahy Scale: Fair Standing balance support: Bilateral upper extremity supported, During functional activity, Reliant on assistive device for balance Standing balance-Leahy Scale: Poor Standing balance comment: knee left buckled a little, reliant on RW for support    Special needs/care consideration none   Previous Home Environment (from acute therapy documentation) Living Arrangements: Alone  Lives With: Family Available Help at Discharge: Family, Available PRN/intermittently Type of Home: Apartment Home Layout: One level Home Access: Level entry Bathroom Shower/Tub: Engineer, manufacturing systems: Standard Bathroom Accessibility: Yes How Accessible: Accessible via  walker Home Care Services: No Additional Comments: has an aide 3x/week 2 hours, Son comes by daily to check on Pt.  Discharge Living Setting Plans for Discharge Living Setting: Apartment Type of Home at Discharge: Apartment Discharge Home Layout: One level Discharge Home Access: Level entry Discharge Bathroom Shower/Tub: Tub/shower unit Discharge Bathroom Toilet: Handicapped height Discharge Bathroom Accessibility: Yes How Accessible: Accessible via wheelchair, Accessible via walker Does the patient have any problems obtaining your medications?: No  Social/Family/Support Systems Patient Roles: Other (Comment) Contact Information: [phone] Anticipated Caregiver: Smith Dunk (sister) Anticipated Caregiver's Contact Information: Sister and son can provide 24/7 min a Ability/Limitations of Caregiver: 24/7 Caregiver Availability: 24/7 Discharge Plan Discussed with Primary Caregiver: Yes Is Caregiver In Agreement with Plan?: Yes Does Caregiver/Family have Issues with Lodging/Transportation while Pt is in Rehab?: No  Goals Patient/Family Goal for Rehab: PT/OT/SLP mod I and  Supervision goals Expected length of stay: 7 days Pt/Family Agrees to Admission and willing to participate: Yes Program Orientation Provided & Reviewed with Pt/Caregiver Including Roles  & Responsibilities: Yes  Decrease burden of Care through IP rehab admission: not anticipated  Possible need for SNF placement upon discharge: not anticipated  Patient Condition: This patient's condition remains as documented in the consult dated 09/27/23, in which the Rehabilitation Physician determined and documented that the patient's condition is appropriate for intensive rehabilitative care in an inpatient rehabilitation facility. Will admit to inpatient rehab today.  Preadmission Screen Completed By:  Chilton Couch, 09/28/2023 10:05 AM ______________________________________________________________________   Discussed  status with Dr. Rachel Budds on 09/28/23 at 0930 and received approval for admission today.  Admission Coordinator:  Chilton Couch, RN, time 1011/Date 09/28/23   Assessment/Plan: Diagnosis: L thalamic CVA Does the need for close, 24 hr/day Medical supervision in concert with the patient's rehab needs make it unreasonable for this patient to be served in a less intensive setting? Yes Co-Morbidities requiring supervision/potential complications: Labile blood sugars with diabetes, hypertension, COPD, CKD stage IIIb, thrombocytopenia Due to bladder management, bowel management, safety, skin/wound care, disease management, medication administration, pain management, and patient education, does the patient require 24 hr/day rehab nursing? Yes Does the patient require coordinated care of a physician, rehab nurse, PT, OT, and SLP to address physical and functional deficits in the context of the above medical diagnosis(es)? Yes Addressing deficits in the  following areas: balance, endurance, locomotion, strength, transferring, bathing, dressing, feeding, grooming, toileting, cognition, and language Can the patient actively participate in an intensive therapy program of at least 3 hrs of therapy 5 days a week? Yes The potential for patient to make measurable gains while on inpatient rehab is excellent Anticipated functional outcomes upon discharge from inpatient rehab: supervision PT, supervision OT, supervision SLP Estimated rehab length of stay to reach the above functional goals is: 5-7 days Anticipated discharge destination: Home 10. Overall Rehab/Functional Prognosis: good   MD Signature:  Bea Lime, DO 09/28/2023

## 2023-09-28 ENCOUNTER — Other Ambulatory Visit: Payer: Self-pay

## 2023-09-28 ENCOUNTER — Encounter (HOSPITAL_COMMUNITY): Payer: Self-pay | Admitting: Physical Medicine & Rehabilitation

## 2023-09-28 ENCOUNTER — Inpatient Hospital Stay (HOSPITAL_COMMUNITY)
Admission: AD | Admit: 2023-09-28 | Discharge: 2023-10-10 | DRG: 057 | Disposition: A | Discharge status: Home Health | Source: Intra-hospital | Attending: Physical Medicine & Rehabilitation | Admitting: Physical Medicine & Rehabilitation

## 2023-09-28 DIAGNOSIS — E1149 Type 2 diabetes mellitus with other diabetic neurological complication: Secondary | ICD-10-CM | POA: Diagnosis not present

## 2023-09-28 DIAGNOSIS — J439 Emphysema, unspecified: Secondary | ICD-10-CM

## 2023-09-28 DIAGNOSIS — I69328 Other speech and language deficits following cerebral infarction: Principal | ICD-10-CM

## 2023-09-28 DIAGNOSIS — Z823 Family history of stroke: Secondary | ICD-10-CM

## 2023-09-28 DIAGNOSIS — R208 Other disturbances of skin sensation: Secondary | ICD-10-CM | POA: Diagnosis present

## 2023-09-28 DIAGNOSIS — G441 Vascular headache, not elsewhere classified: Secondary | ICD-10-CM | POA: Diagnosis not present

## 2023-09-28 DIAGNOSIS — Z833 Family history of diabetes mellitus: Secondary | ICD-10-CM

## 2023-09-28 DIAGNOSIS — N1832 Chronic kidney disease, stage 3b: Secondary | ICD-10-CM | POA: Diagnosis present

## 2023-09-28 DIAGNOSIS — I6381 Other cerebral infarction due to occlusion or stenosis of small artery: Principal | ICD-10-CM | POA: Diagnosis present

## 2023-09-28 DIAGNOSIS — J84112 Idiopathic pulmonary fibrosis: Secondary | ICD-10-CM | POA: Diagnosis present

## 2023-09-28 DIAGNOSIS — H6120 Impacted cerumen, unspecified ear: Secondary | ICD-10-CM | POA: Diagnosis not present

## 2023-09-28 DIAGNOSIS — K219 Gastro-esophageal reflux disease without esophagitis: Secondary | ICD-10-CM | POA: Diagnosis present

## 2023-09-28 DIAGNOSIS — Z79899 Other long term (current) drug therapy: Secondary | ICD-10-CM | POA: Diagnosis not present

## 2023-09-28 DIAGNOSIS — E118 Type 2 diabetes mellitus with unspecified complications: Secondary | ICD-10-CM | POA: Diagnosis not present

## 2023-09-28 DIAGNOSIS — I69392 Facial weakness following cerebral infarction: Secondary | ICD-10-CM | POA: Diagnosis not present

## 2023-09-28 DIAGNOSIS — Z85048 Personal history of other malignant neoplasm of rectum, rectosigmoid junction, and anus: Secondary | ICD-10-CM

## 2023-09-28 DIAGNOSIS — Z7984 Long term (current) use of oral hypoglycemic drugs: Secondary | ICD-10-CM

## 2023-09-28 DIAGNOSIS — Z7982 Long term (current) use of aspirin: Secondary | ICD-10-CM

## 2023-09-28 DIAGNOSIS — F32A Depression, unspecified: Secondary | ICD-10-CM | POA: Diagnosis present

## 2023-09-28 DIAGNOSIS — F419 Anxiety disorder, unspecified: Secondary | ICD-10-CM | POA: Diagnosis present

## 2023-09-28 DIAGNOSIS — Z800 Family history of malignant neoplasm of digestive organs: Secondary | ICD-10-CM

## 2023-09-28 DIAGNOSIS — I69398 Other sequelae of cerebral infarction: Secondary | ICD-10-CM | POA: Diagnosis not present

## 2023-09-28 DIAGNOSIS — R278 Other lack of coordination: Secondary | ICD-10-CM | POA: Diagnosis present

## 2023-09-28 DIAGNOSIS — E1169 Type 2 diabetes mellitus with other specified complication: Secondary | ICD-10-CM | POA: Diagnosis not present

## 2023-09-28 DIAGNOSIS — J4489 Other specified chronic obstructive pulmonary disease: Secondary | ICD-10-CM | POA: Diagnosis present

## 2023-09-28 DIAGNOSIS — Z880 Allergy status to penicillin: Secondary | ICD-10-CM

## 2023-09-28 DIAGNOSIS — Z87891 Personal history of nicotine dependence: Secondary | ICD-10-CM

## 2023-09-28 DIAGNOSIS — E1122 Type 2 diabetes mellitus with diabetic chronic kidney disease: Secondary | ICD-10-CM | POA: Diagnosis present

## 2023-09-28 DIAGNOSIS — Z7902 Long term (current) use of antithrombotics/antiplatelets: Secondary | ICD-10-CM

## 2023-09-28 DIAGNOSIS — Z8249 Family history of ischemic heart disease and other diseases of the circulatory system: Secondary | ICD-10-CM

## 2023-09-28 DIAGNOSIS — I69354 Hemiplegia and hemiparesis following cerebral infarction affecting left non-dominant side: Secondary | ICD-10-CM | POA: Diagnosis not present

## 2023-09-28 DIAGNOSIS — R1312 Dysphagia, oropharyngeal phase: Secondary | ICD-10-CM | POA: Diagnosis present

## 2023-09-28 DIAGNOSIS — E785 Hyperlipidemia, unspecified: Secondary | ICD-10-CM | POA: Diagnosis present

## 2023-09-28 DIAGNOSIS — Z83438 Family history of other disorder of lipoprotein metabolism and other lipidemia: Secondary | ICD-10-CM

## 2023-09-28 DIAGNOSIS — G4700 Insomnia, unspecified: Secondary | ICD-10-CM | POA: Diagnosis not present

## 2023-09-28 DIAGNOSIS — R4781 Slurred speech: Secondary | ICD-10-CM | POA: Diagnosis present

## 2023-09-28 DIAGNOSIS — Z794 Long term (current) use of insulin: Secondary | ICD-10-CM

## 2023-09-28 DIAGNOSIS — I129 Hypertensive chronic kidney disease with stage 1 through stage 4 chronic kidney disease, or unspecified chronic kidney disease: Secondary | ICD-10-CM | POA: Diagnosis present

## 2023-09-28 DIAGNOSIS — I10 Essential (primary) hypertension: Secondary | ICD-10-CM | POA: Diagnosis not present

## 2023-09-28 DIAGNOSIS — R5381 Other malaise: Secondary | ICD-10-CM | POA: Diagnosis present

## 2023-09-28 DIAGNOSIS — Z801 Family history of malignant neoplasm of trachea, bronchus and lung: Secondary | ICD-10-CM

## 2023-09-28 DIAGNOSIS — I69391 Dysphagia following cerebral infarction: Secondary | ICD-10-CM

## 2023-09-28 DIAGNOSIS — E1142 Type 2 diabetes mellitus with diabetic polyneuropathy: Secondary | ICD-10-CM | POA: Diagnosis present

## 2023-09-28 DIAGNOSIS — Z7951 Long term (current) use of inhaled steroids: Secondary | ICD-10-CM

## 2023-09-28 LAB — CBC
HCT: 33.3 % — ABNORMAL LOW (ref 36.0–46.0)
Hemoglobin: 11.8 g/dL — ABNORMAL LOW (ref 12.0–15.0)
MCH: 31.4 pg (ref 26.0–34.0)
MCHC: 35.4 g/dL (ref 30.0–36.0)
MCV: 88.6 fL (ref 80.0–100.0)
Platelets: 125 10*3/uL — ABNORMAL LOW (ref 150–400)
RBC: 3.76 MIL/uL — ABNORMAL LOW (ref 3.87–5.11)
RDW: 15.3 % (ref 11.5–15.5)
WBC: 4.9 10*3/uL (ref 4.0–10.5)
nRBC: 0 % (ref 0.0–0.2)

## 2023-09-28 LAB — GLUCOSE, CAPILLARY
Glucose-Capillary: 216 mg/dL — ABNORMAL HIGH (ref 70–99)
Glucose-Capillary: 173 mg/dL — ABNORMAL HIGH (ref 70–99)
Glucose-Capillary: 123 mg/dL — ABNORMAL HIGH (ref 70–99)

## 2023-09-28 LAB — CREATININE, SERUM
Creatinine, Ser: 1.91 mg/dL — ABNORMAL HIGH (ref 0.44–1.00)
GFR, Estimated: 28 mL/min — ABNORMAL LOW (ref >60)

## 2023-09-28 MED ORDER — ASPIRIN 81 MG PO TBEC: 81.0000 mg | DELAYED_RELEASE_TABLET | Freq: Every day | ORAL | Status: AC

## 2023-09-28 MED ORDER — INSULIN ASPART 100 UNIT/ML IJ SOLN
0.0000 [IU] | Freq: Three times a day (TID) | INTRAMUSCULAR | Status: DC
Start: 2023-09-28 — End: 2023-10-10

## 2023-09-28 MED ORDER — HYDROCORTISONE 0.5 % EX CREA
TOPICAL_CREAM | Freq: Three times a day (TID) | CUTANEOUS | Status: AC
  Filled 2023-09-28: qty 28.35

## 2023-09-28 MED ORDER — HEPARIN SODIUM (PORCINE) 5000 UNIT/ML IJ SOLN: 5000.0000 [IU] | Freq: Three times a day (TID) | INTRAMUSCULAR | Status: DC

## 2023-09-28 MED ORDER — ACETAMINOPHEN 325 MG PO TABS
650.0000 mg | ORAL_TABLET | ORAL | Status: DC | PRN
  Administered 2023-09-28 – 2023-10-07 (×3): 650 mg via ORAL
  Filled 2023-09-28 (×4): qty 2

## 2023-09-28 MED ORDER — INSULIN ASPART 100 UNIT/ML IJ SOLN
0.0000 [IU] | Freq: Three times a day (TID) | INTRAMUSCULAR | Status: DC
Start: 2023-09-28 — End: 2023-10-10
  Administered 2023-09-29: 2 [IU] via SUBCUTANEOUS
  Administered 2023-09-30 – 2023-10-07 (×4): 1 [IU] via SUBCUTANEOUS
  Administered 2023-10-08: 2 [IU] via SUBCUTANEOUS
  Administered 2023-10-08 – 2023-10-09 (×2): 1 [IU] via SUBCUTANEOUS

## 2023-09-28 MED ORDER — PANTOPRAZOLE SODIUM 40 MG PO TBEC
40.0000 mg | DELAYED_RELEASE_TABLET | Freq: Every day | ORAL | Status: DC
Start: 2023-09-29 — End: 2023-10-10
  Administered 2023-09-29 – 2023-10-10 (×12): 40 mg via ORAL
  Filled 2023-09-28 (×12): qty 1

## 2023-09-28 MED ORDER — ACETAMINOPHEN 650 MG RE SUPP: 650.0000 mg | RECTAL | Status: DC | PRN

## 2023-09-28 MED ORDER — SENNOSIDES-DOCUSATE SODIUM 8.6-50 MG PO TABS: 1.0000 | ORAL_TABLET | Freq: Every evening | ORAL | Status: DC | PRN

## 2023-09-28 MED ORDER — CLOPIDOGREL BISULFATE 75 MG PO TABS
75.0000 mg | ORAL_TABLET | Freq: Every day | ORAL | Status: DC
  Administered 2023-09-29 – 2023-10-10 (×12): 75 mg via ORAL
  Filled 2023-09-28 (×12): qty 1

## 2023-09-28 MED ORDER — GABAPENTIN 100 MG PO CAPS
100.0000 mg | ORAL_CAPSULE | Freq: Every day | ORAL | Status: DC | PRN
  Administered 2023-09-30: 100 mg via ORAL
  Filled 2023-09-28: qty 1

## 2023-09-28 MED ORDER — INSULIN ASPART 100 UNIT/ML IJ SOLN
0.0000 [IU] | Freq: Three times a day (TID) | INTRAMUSCULAR | Status: DC
Start: 2023-09-28 — End: 2023-09-28
  Administered 2023-09-28: 1 [IU] via SUBCUTANEOUS

## 2023-09-28 MED ORDER — FLUTICASONE FUROATE-VILANTEROL 100-25 MCG/ACT IN AEPB
1.0000 | INHALATION_SPRAY | Freq: Every day | RESPIRATORY_TRACT | Status: DC
  Administered 2023-09-29 – 2023-10-10 (×9): 1 via RESPIRATORY_TRACT
  Filled 2023-09-28: qty 28

## 2023-09-28 MED ORDER — ASPIRIN 81 MG PO TBEC
81.0000 mg | DELAYED_RELEASE_TABLET | Freq: Every day | ORAL | Status: DC
  Administered 2023-09-29 – 2023-10-10 (×12): 81 mg via ORAL
  Filled 2023-09-28 (×12): qty 1

## 2023-09-28 MED ORDER — ACETAMINOPHEN 160 MG/5ML PO SOLN: 650.0000 mg | ORAL | Status: DC | PRN

## 2023-09-28 MED ORDER — SERTRALINE HCL 50 MG PO TABS
50.0000 mg | ORAL_TABLET | Freq: Every day | ORAL | Status: DC
Start: 2023-09-29 — End: 2023-10-10
  Administered 2023-09-29 – 2023-10-10 (×12): 50 mg via ORAL
  Filled 2023-09-28 (×12): qty 1

## 2023-09-28 MED ORDER — EZETIMIBE 10 MG PO TABS: 10.0000 mg | ORAL_TABLET | Freq: Every day | ORAL | Status: DC

## 2023-09-28 MED ORDER — VENLAFAXINE HCL ER 150 MG PO CP24
150.0000 mg | ORAL_CAPSULE | Freq: Every day | ORAL | Status: DC
  Administered 2023-09-29 – 2023-10-10 (×12): 150 mg via ORAL
  Filled 2023-09-28 (×12): qty 1

## 2023-09-28 MED ORDER — EZETIMIBE 10 MG PO TABS
10.0000 mg | ORAL_TABLET | Freq: Every day | ORAL | Status: DC
Start: 2023-09-29 — End: 2023-10-10
  Administered 2023-09-29 – 2023-10-10 (×12): 10 mg via ORAL
  Filled 2023-09-28 (×12): qty 1

## 2023-09-28 MED ORDER — ACETAMINOPHEN 325 MG PO TABS: 650.0000 mg | ORAL_TABLET | ORAL | Status: AC | PRN

## 2023-09-28 MED ORDER — PIRFENIDONE 801 MG PO TABS
1.0000 | ORAL_TABLET | Freq: Three times a day (TID) | ORAL | Status: DC
  Administered 2023-09-30 – 2023-10-10 (×29): 801 mg via ORAL
  Filled 2023-09-28 (×27): qty 1

## 2023-09-28 MED ORDER — ROSUVASTATIN CALCIUM 20 MG PO TABS
40.0000 mg | ORAL_TABLET | Freq: Every day | ORAL | Status: DC
  Administered 2023-09-29 – 2023-10-10 (×12): 40 mg via ORAL
  Filled 2023-09-28 (×12): qty 2

## 2023-09-28 MED ORDER — HEPARIN SODIUM (PORCINE) 5000 UNIT/ML IJ SOLN
5000.0000 [IU] | Freq: Three times a day (TID) | INTRAMUSCULAR | Status: DC
  Administered 2023-09-28 – 2023-10-04 (×17): 5000 [IU] via SUBCUTANEOUS
  Filled 2023-09-28 (×17): qty 1

## 2023-09-28 MED ORDER — ALBUTEROL SULFATE (2.5 MG/3ML) 0.083% IN NEBU: 3.0000 mL | INHALATION_SOLUTION | RESPIRATORY_TRACT | Status: DC | PRN

## 2023-09-28 NOTE — Plan of Care (Signed)
  Problem: Education: Goal: Knowledge of disease or condition will improve Outcome: Completed/Met Goal: Knowledge of secondary prevention will improve (MUST DOCUMENT ALL) Outcome: Completed/Met Goal: Knowledge of patient specific risk factors will improve (DELETE if not current risk factor) Outcome: Completed/Met   Problem: Ischemic Stroke/TIA Tissue Perfusion: Goal: Complications of ischemic stroke/TIA will be minimized Outcome: Completed/Met   Problem: Coping: Goal: Will verbalize positive feelings about self Outcome: Completed/Met Goal: Will identify appropriate support needs Outcome: Completed/Met   Problem: Health Behavior/Discharge Planning: Goal: Ability to manage health-related needs will improve Outcome: Completed/Met Goal: Goals will be collaboratively established with patient/family Outcome: Completed/Met   Problem: Self-Care: Goal: Ability to participate in self-care as condition permits will improve Outcome: Completed/Met Goal: Verbalization of feelings and concerns over difficulty with self-care will improve Outcome: Completed/Met Goal: Ability to communicate needs accurately will improve Outcome: Completed/Met   Problem: Nutrition: Goal: Risk of aspiration will decrease Outcome: Completed/Met Goal: Dietary intake will improve Outcome: Completed/Met   Problem: Education: Goal: Ability to describe self-care measures that may prevent or decrease complications (Diabetes Survival Skills Education) will improve Outcome: Completed/Met Goal: Individualized Educational Video(s) Outcome: Completed/Met   Problem: Coping: Goal: Ability to adjust to condition or change in health will improve Outcome: Completed/Met   Problem: Fluid Volume: Goal: Ability to maintain a balanced intake and output will improve Outcome: Completed/Met   Problem: Health Behavior/Discharge Planning: Goal: Ability to identify and utilize available resources and services will  improve Outcome: Completed/Met Goal: Ability to manage health-related needs will improve Outcome: Completed/Met   Problem: Metabolic: Goal: Ability to maintain appropriate glucose levels will improve Outcome: Completed/Met   Problem: Nutritional: Goal: Maintenance of adequate nutrition will improve Outcome: Completed/Met Goal: Progress toward achieving an optimal weight will improve Outcome: Completed/Met   Problem: Skin Integrity: Goal: Risk for impaired skin integrity will decrease Outcome: Completed/Met   Problem: Tissue Perfusion: Goal: Adequacy of tissue perfusion will improve Outcome: Completed/Met   Problem: Education: Goal: Knowledge of General Education information will improve Description: Including pain rating scale, medication(s)/side effects and non-pharmacologic comfort measures Outcome: Completed/Met   Problem: Health Behavior/Discharge Planning: Goal: Ability to manage health-related needs will improve Outcome: Completed/Met   Problem: Clinical Measurements: Goal: Ability to maintain clinical measurements within normal limits will improve Outcome: Completed/Met Goal: Will remain free from infection Outcome: Completed/Met Goal: Diagnostic test results will improve Outcome: Completed/Met Goal: Respiratory complications will improve Outcome: Completed/Met Goal: Cardiovascular complication will be avoided Outcome: Completed/Met   Problem: Activity: Goal: Risk for activity intolerance will decrease Outcome: Completed/Met   Problem: Nutrition: Goal: Adequate nutrition will be maintained Outcome: Completed/Met   Problem: Coping: Goal: Level of anxiety will decrease Outcome: Completed/Met   Problem: Elimination: Goal: Will not experience complications related to bowel motility Outcome: Completed/Met Goal: Will not experience complications related to urinary retention Outcome: Completed/Met   Problem: Pain Managment: Goal: General experience of  comfort will improve and/or be controlled Outcome: Completed/Met   Problem: Safety: Goal: Ability to remain free from injury will improve Outcome: Completed/Met   Problem: Skin Integrity: Goal: Risk for impaired skin integrity will decrease Outcome: Completed/Met

## 2023-09-28 NOTE — Plan of Care (Signed)
  Problem: Education: Goal: Knowledge of disease or condition will improve Outcome: Progressing Goal: Knowledge of secondary prevention will improve (MUST DOCUMENT ALL) Outcome: Progressing   Problem: Ischemic Stroke/TIA Tissue Perfusion: Goal: Complications of ischemic stroke/TIA will be minimized Outcome: Progressing   Problem: Coping: Goal: Will verbalize positive feelings about self Outcome: Progressing Goal: Will identify appropriate support needs Outcome: Progressing   Problem: Health Behavior/Discharge Planning: Goal: Goals will be collaboratively established with patient/family Outcome: Progressing   Problem: Coping: Goal: Ability to adjust to condition or change in health will improve Outcome: Progressing   Problem: Nutritional: Goal: Maintenance of adequate nutrition will improve Outcome: Progressing

## 2023-09-28 NOTE — Progress Notes (Signed)
 Physical Medicine and Rehabilitation Consult Reason for Consult: Rehab Referring Physician: Dr. Osborne Blazer     HPI: Erica [name] is a 68 y.o. female with past medical history of hypertension, depression, diabetes, COPD, prior strokes, rectal cancer who was admitted with left thalamic CVA after she developed right facial and tongue numbness and altered speech.  Patient was seen by neurology and aspirin 81 mg was added to Plavix 75 mg.  She had a MRI on 09/25/2023 that showed acute lacunar infarct of the left thalamus, multiple additional remote strokes and chronic moderate microvascular ischemic changes.  Zetia was added to Crestor.  Patient was treated in CIR last year also after CVA.  She has been seen by speech therapy for impaired speech and cognition.  Patient was seen by physical therapy with deficits in the min assist/mod assist level with CIR recommended.     Pt reports living in 1 story home with 2 STE, she reports son can assist 24/7 after discharge.    Review of Systems  Constitutional:  Negative for chills and fever.  HENT:  Positive for ear pain. Negative for congestion.   Eyes:  Negative for blurred vision and double vision.  Respiratory:  Negative for shortness of breath.   Cardiovascular:  Negative for chest pain.  Gastrointestinal:  Negative for abdominal pain, constipation, diarrhea and vomiting.  Genitourinary: Negative.   Musculoskeletal:  Negative for joint pain.  Skin:  Negative for rash.  Neurological:  Positive for sensory change, speech change, weakness and headaches.        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   GERD (gastroesophageal reflux disease)      with chronic cough   Headache(784.0)      "mild"   Hypertension     Loop Biotronik loop implant 12/16/2020 12/16/2020   Mental  disorder      depression   Neuropathy      feet    Pain      arthritis pain - takes tramadol as needed   Pulmonary fibrosis, unspecified (HCC)     Rectal cancer (HCC)     Rectal polyp      very little bleeding with bowel movements- no pain   Stroke (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   Suicide attempt [facility])               Past Surgical History:  Procedure Laterality Date   ANAL RECTAL MANOMETRY N/A 07/13/2016    Procedure: ANO RECTAL MANOMETRY;  Surgeon: Joyce Nixon, MD;  Location: [facility];  Service: Endoscopy;  Laterality: N/A;   CESAREAN SECTION       EUS N/A 11/21/2012    Procedure: LOWER ENDOSCOPIC ULTRASOUND (EUS);  Surgeon: Evangeline Hilts, MD;  Location: [facility];  Service: Endoscopy;  Laterality: N/A;   FLEXIBLE SIGMOIDOSCOPY N/A 11/21/2012    Procedure: FLEXIBLE SIGMOIDOSCOPY;  Surgeon: Evangeline Hilts, MD;  Location: [facility];  Service: Endoscopy;  Laterality: N/A;   FLEXIBLE SIGMOIDOSCOPY N/A 01/28/2013    Procedure: FLEXIBLE SIGMOIDOSCOPY;  Surgeon: Joyce Nixon, MD;  Location: [facility];  Service: Endoscopy;  Laterality: N/A;   LAPAROSCOPIC LOW ANTERIOR RESECTION N/A 01/29/2013    Procedure: LAPAROSCOPIC LOW ANTERIOR RESECTION, Rigid Proctoscopy;  Surgeon: Joyce Nixon, MD;  Location: [facility];  Service: General;  Laterality: N/A;   LAPAROSCOPIC SIGMOID COLECTOMY N/A 11/14/2012    Procedure: DIAGNOSTIC LAPAROSCOPY AND SIGMOIDMOIDOSCOPY ;  Surgeon: Enid Harry, MD;  Location: [facility];  Service: General;  Laterality: N/A;   RECTAL ULTRASOUND N/A 07/13/2016    Procedure: RECTAL ULTRASOUND;  Surgeon: Joyce Nixon, MD;  Location: [facility];  Service: Endoscopy;  Laterality: N/A;   TONSILLECTOMY       TONSILLECTOMY AND ADENOIDECTOMY                 Family History  Problem Relation Age of Onset   Hypertension Father     Stroke Father     Diabetes Father     Heart disease Mother     Hypertension Mother     Hyperlipidemia Mother     Hypertension Sister      Hypertension Brother     Hypertension Sister     Hypertension Brother     Cancer Other 89        colon cancer    Cancer Maternal Aunt          cancer, unknown type    Cancer Maternal Uncle          bone cancer    Cancer Paternal Aunt          lung cancer   Cancer Paternal Uncle          lung cancer   Cancer Maternal Uncle          colon cancer and prostate cancer    Cancer Maternal Uncle          prostate cancer         Social History:  reports that she quit smoking about 5 years ago. Her smoking use included cigarettes. She started smoking about 25 years ago. She has a 10 pack-year smoking history. She has never used smokeless tobacco. She reports that she does not currently use alcohol. She reports that she does not currently use drugs after having used the following drugs: "Crack" cocaine. Allergies:  Allergies       Allergies  Allergen Reactions   Penicillins Hives, Itching and Swelling      Tongue swelling            Medications Prior to Admission  Medication Sig Dispense Refill   albuterol (VENTOLIN HFA) 108 (90 Base) MCG/ACT inhaler Inhale 2 puffs into the lungs every 4 (four) hours as needed for wheezing. (Patient taking differently: Inhale 2 puffs into the lungs 2 (two) times daily as needed for wheezing.) 1 g 5   amLODipine (NORVASC) 10 MG tablet Take 1 tablet (10 mg total) by mouth daily. 30 tablet 0   cetirizine (ZYRTEC) 10 MG tablet Take 1 tablet (10 mg total) by mouth daily. 30 tablet 0   clopidogrel (PLAVIX) 75 MG tablet Take 75 mg by mouth daily.       diclofenac Sodium (VOLTAREN) 1 % GEL Apply 1 Application topically 2 (two) times daily as needed (pain).       DULoxetine (CYMBALTA) 30 MG capsule Take 30 mg by mouth daily.       FARXIGA 10 MG TABS tablet Take 10 mg by mouth daily.       gabapentin (NEURONTIN) 100 MG capsule Take 1 capsule (100 mg total) by mouth 2 (two) times daily. Patient is taking as needed (Patient taking differently: Take 100 mg by mouth  daily as needed (pain).) 60 capsule 0   hydrALAZINE (APRESOLINE) 25 MG tablet SMARTSIG:1.5 Tablet(s) By  Mouth Every 8 Hours       LANTUS SOLOSTAR 100 UNIT/ML Solostar Pen Inject 30 Units into the skin daily.       naphazoline-glycerin (CLEAR EYES REDNESS) 0.012-0.25 % SOLN Place 1-2 drops into both eyes 3 (three) times daily with meals. (Patient taking differently: Place 2 drops into both eyes daily as needed for eye irritation.)       pantoprazole (PROTONIX) 40 MG tablet Take 40 mg by mouth daily.       Pirfenidone 801 MG TABS TAKE 1 TABLET (801 MG) BY MOUTH WITH BREAKFAST, WITH LUNCH, AND WITH EVENING MEAL. (Patient taking differently: Take 1 tablet by mouth in the morning and at bedtime.) 270 tablet 0   rosuvastatin (CRESTOR) 40 MG tablet Take 1 tablet (40 mg total) by mouth daily. 30 tablet 0   sertraline (ZOLOFT) 50 MG tablet Take 1 tablet (50 mg total) by mouth daily. 30 tablet 0   triamcinolone cream (KENALOG) 0.1 % Apply 1 Application topically daily.       venlafaxine XR (EFFEXOR-XR) 150 MG 24 hr capsule Take 1 capsule (150 mg total) by mouth daily. 30 capsule 0   SYMBICORT 160-4.5 MCG/ACT inhaler INHALE 2 PUFFS INTO THE LUNGS TWICE DAILY (Patient not taking: Reported on 09/25/2023) 10.2 g 5          Home: Home Living Family/patient expects to be discharged to:: Private residence Living Arrangements: Children Available Help at Discharge: Family, Available PRN/intermittently Type of Home: Apartment Home Access: Level entry Home Layout: One level Bathroom Shower/Tub: Engineer, manufacturing systems: Standard Home Equipment: [employer] - single point, Information systems manager, Agricultural consultant (2 wheels) Additional Comments: has an aide 3x/week 2 hours  Lives With: Family  Functional History: Prior Function Prior Level of Function : Independent/Modified Independent Mobility Comments: uses cane versus walker depending on the day ADLs Comments: sister and son assist with IADL's, she prepares microwave  meals Functional Status:  Mobility: Bed Mobility Overal bed mobility: Needs Assistance Bed Mobility: Supine to Sit, Sit to Supine Supine to sit: Mod assist, HOB elevated, Used rails Sit to supine: Min assist General bed mobility comments: assist for lifting trunk and scooting hips, to supine assist to scoot to [facility] Transfers Overall transfer level: Needs assistance Equipment used: Rolling walker (2 wheels) Transfers: Sit to/from Stand Sit to Stand: Min assist General transfer comment: up from tall stretcher to RW A for balance Ambulation/Gait Ambulation/Gait assistance: Editor, commissioning (Feet): 40 Feet Assistive device: Rolling walker (2 wheels) Gait Pattern/deviations: Step-to pattern, Step-through pattern, Decreased stride length, Trunk flexed, Decreased dorsiflexion - left, Knee flexed in stance - left General Gait Details: slow pace, increased L knee flexion in stance with distance, reported feeling light headed, though BP stable from supine to sitting; reported headache that started with ambulation   ADL:   Cognition: Cognition Overall Cognitive Status: Impaired/Different from baseline Arousal/Alertness: Awake/alert Orientation Level: Oriented X4 Year: 2025 Month: April Day of Week: Correct Attention: Sustained Sustained Attention: Impaired Sustained Attention Impairment: Verbal basic, Functional basic Memory: Impaired Memory Impairment: Storage deficit, Retrieval deficit Awareness: Appears intact Problem Solving: Impaired Problem Solving Impairment: Verbal basic, Functional basic Safety/Judgment: Appears intact Cognition Arousal: Alert Behavior During Therapy: WFL for tasks assessed/performed Overall Cognitive Status: Impaired/Different from baseline   Blood pressure (!) 150/90, pulse 67, temperature (!) 97.5 F (36.4 C), temperature source Oral, resp. rate 16, height 5\' 2"  (1.575 m), weight 72.1 kg, SpO2 100%. Physical Exam   General: No apparent  distress HEENT: Head  is normocephalic, atraumatic, sclera anicteric, oral mucosa a little dry Neck: Supple without JVD or lymphadenopathy Heart: Reg rate and rhythm. Chest: CTA bilaterally without wheezes, rales, or rhonchi; no distress Abdomen: Soft, non-tender, non-distended, bowel sounds positive. Extremities: No clubbing, cyanosis, or edema. Pulses are 2+ Psych: Pt's affect is flat Skin: Clean and intact without signs of breakdown Neuro:     Mental Status: AAOx3 Speech/Languate: Slow speech. Naming and repetition intact,  follows simple commands CRANIAL NERVES: II: PERRL. Visual fields full III, IV, VI: EOM intact, no gaze preference or deviation V: altered R facial sensation VII: no asymmetry VIII: normal hearing to speech IX, X: normal palatal elevation XI: Intact shoulder shrug bilaterally XII: Tongue midline     MOTOR: RUE: 4/5 Deltoid, 4/5 Biceps, 4-/5 Triceps,4-/5 Grip LUE: 5-/5 Deltoid, 5-/5 Biceps, 4-/5 Triceps, 4-/5 Grip RLE: HF 4-/5, KE 4-/5, ADF 4-/5, APF 4-/5 LLE: HF 4-/5, KE 4-/5, ADF 4-/5, APF 4-/5     REFLEXES: no ankle clonus   SENSORY: Altered Lue and LLE   Coordination: Altered Left finger to nose and heel to shin         Lab Results Last 24 Hours       Results for orders placed or performed during the hospital encounter of 09/25/23 (from the past 24 hours)  CBG monitoring, ED     Status: Abnormal    Collection Time: 09/26/23 11:52 AM  Result Value Ref Range    Glucose-Capillary 123 (H) 70 - 99 mg/dL  Glucose, capillary     Status: Abnormal    Collection Time: 09/26/23  4:33 PM  Result Value Ref Range    Glucose-Capillary 127 (H) 70 - 99 mg/dL  Glucose, capillary     Status: Abnormal    Collection Time: 09/26/23  7:43 PM  Result Value Ref Range    Glucose-Capillary 206 (H) 70 - 99 mg/dL  Glucose, capillary     Status: Abnormal    Collection Time: 09/27/23  7:49 AM  Result Value Ref Range    Glucose-Capillary 203 (H) 70 - 99 mg/dL   Platelet inhibition [id number] (not at [facility])     Status: Abnormal    Collection Time: 09/27/23  9:03 AM  Result Value Ref Range    Platelet Function  P2Y12 161 (L) 182 - 335 PRU  CBC     Status: Abnormal    Collection Time: 09/27/23  9:03 AM  Result Value Ref Range    WBC 4.8 4.0 - 10.5 K/uL    RBC 3.87 3.87 - 5.11 MIL/uL    Hemoglobin 12.1 12.0 - 15.0 g/dL    HCT 60.4 (L) 54.0 - 46.0 %    MCV 87.3 80.0 - 100.0 fL    MCH 31.3 26.0 - 34.0 pg    MCHC 35.8 30.0 - 36.0 g/dL    RDW 98.1 (H) 19.1 - 15.5 %    Platelets 127 (L) 150 - 400 K/uL    nRBC 0.0 0.0 - 0.2 %  Basic metabolic panel with GFR     Status: Abnormal    Collection Time: 09/27/23  9:03 AM  Result Value Ref Range    Sodium 140 135 - 145 mmol/L    Potassium 4.7 3.5 - 5.1 mmol/L    Chloride 108 98 - 111 mmol/L    CO2 21 (L) 22 - 32 mmol/L    Glucose, Bld 187 (H) 70 - 99 mg/dL    BUN 26 (H) 8 - 23 mg/dL  Creatinine, Ser 1.67 (H) 0.44 - 1.00 mg/dL    Calcium 8.8 (L) 8.9 - 10.3 mg/dL    GFR, Estimated 33 (L) >60 mL/min    Anion gap 11 5 - 15       Imaging Results (Last 48 hours)  ECHOCARDIOGRAM COMPLETE Result Date: 09/26/2023    ECHOCARDIOGRAM REPORT   Patient Name:   Erica [name] Date of Exam: 09/26/2023 Medical Rec #:  [id number]    Height:       62.0 in Accession #:    [id number]   Weight:       159.0 lb Date of Birth:  Mar 11, 1956    BSA:          1.734 m Patient Age:    68 years     BP:           140/87 mmHg Patient Gender: F            HR:           67 bpm. Exam Location:  Inpatient Procedure: 2D Echo, Cardiac Doppler, Color Doppler and Intracardiac            Opacification Agent (Both Spectral and Color Flow Doppler were            utilized during procedure). Indications:    Stroke  History:        Patient has prior history of Echocardiogram examinations, most                 recent 03/05/2023. COPD; Risk Factors:Hypertension, Diabetes,                 Dyslipidemia and Former Smoker.  Sonographer:    Reta Cassis Referring  Phys: [id number] VISHAL R PATEL  Sonographer Comments: Technically difficult study due to poor echo windows and patient is obese. Image acquisition challenging due to COPD. IMPRESSIONS  1. Left ventricular ejection fraction, by estimation, is 55 to 60%. The left ventricle has normal function. The left ventricle demonstrates regional wall motion abnormalities (see scoring diagram/findings for description). There is mild left ventricular  hypertrophy. Left ventricular diastolic parameters are consistent with Grade I diastolic dysfunction (impaired relaxation).  2. Right ventricular systolic function is normal. The right ventricular size is normal.  3. Left atrial size was mildly dilated.  4. The mitral valve is normal in structure. No evidence of mitral valve regurgitation. No evidence of mitral stenosis.  5. The aortic valve is calcified. There is moderate calcification of the aortic valve. There is moderate thickening of the aortic valve. Aortic valve regurgitation is mild. Aortic valve sclerosis is present, with no evidence of aortic valve stenosis. Aortic valve mean gradient measures 4.0 mmHg. Aortic valve Vmax measures 1.36 m/s.  6. The inferior vena cava is normal in size with greater than 50% respiratory variability, suggesting right atrial pressure of 3 mmHg. FINDINGS  Left Ventricle: Left ventricular ejection fraction, by estimation, is 55 to 60%. The left ventricle has normal function. The left ventricle demonstrates regional wall motion abnormalities. Definity contrast agent was given IV to delineate the left ventricular endocardial borders. The left ventricular internal cavity size was normal in size. There is mild left ventricular hypertrophy. Left ventricular diastolic parameters are consistent with Grade I diastolic dysfunction (impaired relaxation).  LV Wall Scoring: The basal inferior segment is akinetic. The basal inferoseptal segment is hypokinetic. Right Ventricle: The right ventricular size is  normal. No increase in right ventricular wall thickness. Right ventricular systolic function is normal.  Left Atrium: Left atrial size was mildly dilated. Right Atrium: Right atrial size was normal in size. Pericardium: There is no evidence of pericardial effusion. Mitral Valve: The mitral valve is normal in structure. No evidence of mitral valve regurgitation. No evidence of mitral valve stenosis. Tricuspid Valve: The tricuspid valve is normal in structure. Tricuspid valve regurgitation is not demonstrated. No evidence of tricuspid stenosis. Aortic Valve: The aortic valve is calcified. There is moderate calcification of the aortic valve. There is moderate thickening of the aortic valve. Aortic valve regurgitation is mild. Aortic valve sclerosis is present, with no evidence of aortic valve stenosis. Aortic valve mean gradient measures 4.0 mmHg. Aortic valve peak gradient measures 7.4 mmHg. Aortic valve area, by VTI measures 1.89 cm. Pulmonic Valve: The pulmonic valve was normal in structure. Pulmonic valve regurgitation is not visualized. No evidence of pulmonic stenosis. Aorta: The aortic root is normal in size and structure. Venous: The inferior vena cava is normal in size with greater than 50% respiratory variability, suggesting right atrial pressure of 3 mmHg. IAS/Shunts: No atrial level shunt detected by color flow Doppler.  LEFT VENTRICLE PLAX 2D LVIDd:         3.90 cm   Diastology LVIDs:         2.50 cm   LV e' medial:    3.26 cm/s LV PW:         1.20 cm   LV E/e' medial:  16.2 LV IVS:        1.40 cm   LV e' lateral:   5.22 cm/s LVOT diam:     2.00 cm   LV E/e' lateral: 10.1 LV SV:         55 LV SV Index:   32 LVOT Area:     3.14 cm  RIGHT VENTRICLE             IVC RV Basal diam:  3.60 cm     IVC diam: 1.40 cm RV S prime:     10.60 cm/s TAPSE (M-mode): 2.9 cm LEFT ATRIUM             Index        RIGHT ATRIUM           Index LA diam:        3.80 cm 2.19 cm/m   RA Area:     16.10 cm LA Vol (A2C):   69.5 ml  40.08 ml/m  RA Volume:   39.40 ml  22.72 ml/m LA Vol (A4C):   44.8 ml 25.83 ml/m LA Biplane Vol: 55.8 ml 32.18 ml/m  AORTIC VALVE AV Area (Vmax):    1.82 cm AV Area (Vmean):   1.81 cm AV Area (VTI):     1.89 cm AV Vmax:           136.00 cm/s AV Vmean:          90.500 cm/s AV VTI:            0.289 m AV Peak Grad:      7.4 mmHg AV Mean Grad:      4.0 mmHg LVOT Vmax:         79.00 cm/s LVOT Vmean:        52.200 cm/s LVOT VTI:          0.174 m LVOT/AV VTI ratio: 0.60  AORTA Ao Root diam: 3.20 cm Ao Asc diam:  3.10 cm MITRAL VALVE MV Area (PHT): 3.17 cm     SHUNTS MV  Decel Time: 239 msec     Systemic VTI:  0.17 m MV E velocity: 52.80 cm/s   Systemic Diam: 2.00 cm MV A velocity: 102.00 cm/s MV E/A ratio:  0.52 Dorothye Gathers MD Electronically signed by Dorothye Gathers MD Signature Date/Time: 09/26/2023/2:46:16 PM    Final     MR BRAIN WO CONTRAST Result Date: 09/25/2023 CLINICAL DATA:  Neuro deficit, concern for stroke. Left-sided symptoms, left facial numbness, slurred speech. EXAM: MRI HEAD WITHOUT CONTRAST TECHNIQUE: Multiplanar, multiecho pulse sequences of the brain and surrounding structures were obtained without intravenous contrast. COMPARISON:  Same day CT head and CTA head and neck. MRI head 03/18/2023. FINDINGS: Brain: Punctate focus of restricted diffusion involving the left thalamus partially extending into the cerebral peduncle compatible with acute lacunar infarct. No additional areas of acute infarct pre shaded. Focal encephalomalacia in the right cerebral hemisphere compatible with remote infarcts. Mild associated susceptibility suggestive of prior hemorrhage. Additional small focus of susceptibility in the left middle frontal gyrus with encephalomalacia and gliosis compatible with remote infarct and remote hemorrhage. Additional remote infarcts in the right frontal lobe, bilateral occipital lobes, small remote infarcts in the left cerebellum, and remote lacunar infarcts in the bilateral basal ganglia  and bilateral thalami. Scattered and confluent FLAIR signal abnormality in the periventricular and subcortical white matter suggestive of moderate chronic microvascular ischemic changes. No midline shift. The basilar cisterns are patent. No extra-axial fluid collections. Ventricles: Prominence of the lateral ventricles suggestive of underlying parenchymal volume loss. Vascular: Skull base flow voids are visualized. Skull and upper cervical spine: No focal abnormality. Sinuses/Orbits: Orbits are symmetric. Paranasal sinuses are clear. Other: Mastoid air cells are clear. IMPRESSION: Acute lacunar infarct in the left thalamus. Multiple additional remote infarcts as above, similar to 2024. Moderate chronic microvascular ischemic changes and mild parenchymal volume loss. These results were communicated to [facility] at 5:48 pm on 09/25/2023 by text page via the [facility] messaging system. Electronically Signed   By: Denny Flack M.D.   On: 09/25/2023 17:49    CT ANGIO HEAD NECK W WO CM (CODE STROKE) Result Date: 09/25/2023 CLINICAL DATA:  Provided history: Neuro deficit, acute, stroke suspected. Additional history obtained from electronic MEDICAL RECORD NUMBERFacial numbness, slurred speech. EXAM: CT ANGIOGRAPHY HEAD AND NECK WITH AND WITHOUT CONTRAST TECHNIQUE: Multidetector CT imaging of the head and neck was performed using the standard protocol during bolus administration of intravenous contrast. Multiplanar CT image reconstructions and MIPs were obtained to evaluate the vascular anatomy. Carotid stenosis measurements (when applicable) are obtained utilizing NASCET criteria, using the distal internal carotid diameter as the denominator. RADIATION DOSE REDUCTION: This exam was performed according to the departmental dose-optimization program which includes automated exposure control, adjustment of the mA and/or kV according to patient size and/or use of iterative reconstruction technique. CONTRAST:  75mL OMNIPAQUE IOHEXOL  300 MG/ML  SOLN COMPARISON:  Non-contrast head CT performed earlier today 09/25/2023. CT angiogram head/neck 03/18/2023. FINDINGS: CTA NECK FINDINGS Aortic arch: Standard aortic branching. The visualized thoracic aorta is normal in caliber. Streak/beam hardening artifact arising from a dense contrast bolus partially obscures the left subclavian artery. Within this limitation, there is no appreciable hemodynamically significant innominate or proximal subclavian artery stenosis. Right carotid system: CCA and ICA patent within the neck without stenosis or significant atherosclerotic disease. No evidence of dissection. Left carotid system: Beam hardening artifact arising from a dense contrast bolus partially obscures the proximal CCA. Within this limitation, the CCA and ICA are patent within the neck without  stenosis or significant atherosclerotic disease. No evidence of dissection. Vertebral arteries: Codominant and patent within the neck without stenosis or significant atherosclerotic disease. No evidence of dissection. Skeleton: Spondylosis at the cervical and visualized upper thoracic levels. No acute fracture or aggressive osseous lesion. Other neck: No neck mass or cervical lymphadenopathy. Upper chest: No consolidation within the imaged lung apices. Centrilobular paraseptal emphysema. Incompletely assessed fibrotic lung disease. Review of the MIP images confirms the above findings CTA HEAD FINDINGS Anterior circulation: The intracranial internal carotid arteries are patent. Atherosclerotic plaque within both vessels with no more than mild stenosis. The M1 middle cerebral arteries are patent. No M2 proximal branch occlusion or high-grade proximal stenosis. The anterior cerebral arteries are patent. No intracranial aneurysm is identified. Posterior circulation: The intracranial vertebral arteries are patent. The basilar artery is patent. 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 The  posterior cerebral arteries are patent. Posterior communicating arteries are diminutive or absent, bilaterally. Venous sinuses: Within the limitations of contrast timing, no convincing thrombus. Anatomic variants: As described. Review of the MIP images confirms the above findings No emergent large vessel occlusion identified. These results were communicated to [facility] at 12:10 pmon 4/14/2025by text page via the [facility] messaging system. IMPRESSION: CTA neck: 1. Artifact arising from a dense contrast bolus partially obscures the proximal left common carotid artery. Within this limitation, the common carotid, internal carotid and vertebral arteries are patent within the neck without stenosis or significant atherosclerotic disease. No evidence of dissection. 2. Incompletely assessed fibrotic lung disease. 3. Emphysema (ICD10-J43.9). CTA head: 1. No proximal intracranial large vessel occlusion or high-grade proximal arterial stenosis identified. 2. Atherosclerotic plaque within the intracranial internal carotid arteries with no more than mild stenosis. Electronically Signed   By: Jackey Loge D.O.   On: 09/25/2023 12:10    CT HEAD CODE STROKE WO CONTRAST Result Date: 09/25/2023 CLINICAL DATA:  Code stroke. EXAM: CT HEAD WITHOUT CONTRAST TECHNIQUE: Contiguous axial images were obtained from the base of the skull through the vertex without intravenous contrast. RADIATION DOSE REDUCTION: This exam was performed according to the departmental dose-optimization program which includes automated exposure control, adjustment of the mA and/or kV according to patient size and/or use of iterative reconstruction technique. COMPARISON:  06/09/2023 FINDINGS: Brain: Scattered areas of gray-white differentiation loss in the bilateral occipital parietal cortex and in the anterior and superior frontal lobes. Small chronic infarcts in the bilateral cerebellum. Chronic lacunar infarcts in the bilateral thalamus and caudate. 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 No evidence of acute infarct, hemorrhage, hydrocephalus, or mass. Vascular: No hyperdense vessel or unexpected calcification. Skull: Normal. Negative for fracture or focal lesion. Sinuses/Orbits: No acute finding. IMPRESSION: 1. No acute finding. 2. Numerous small cortical, cerebellar, and lacunar infarcts as seen on head CT 06/09/2023. Electronically Signed   By: Tiburcio Pea M.D.   On: 09/25/2023 11:34       Assessment/Plan: Diagnosis:  Left thalamic infarct  Does the need for close, 24 hr/day medical supervision in concert with the patient's rehab needs make it unreasonable for this patient to be served in a less intensive setting? Yes Co-Morbidities requiring supervision/potential complications:  - Prior CVA, insulin-dependent type 2 diabetes, hypertension, hyperlipidemia, COPD, depression, CKD 3B, pulmonary fibrosis, Due to bladder management, bowel management, safety, skin/wound care, disease management, medication administration, pain management, and patient education, does the patient require 24 hr/day rehab nursing? Yes Does the patient require coordinated care of a physician, rehab nurse, therapy  disciplines of PT/OT/SLP to address physical and functional deficits in the context of the above medical diagnosis(es)? Yes Addressing deficits in the following areas: balance, endurance, locomotion, strength, transferring, bowel/bladder control, bathing, dressing, feeding, grooming, toileting, cognition, speech, language, and psychosocial support Can the patient actively participate in an intensive therapy program of at least 3 hrs of therapy per day at least 5 days per week? Yes The potential for patient to make measurable gains while on inpatient rehab is excellent Anticipated functional outcomes upon discharge from inpatient rehab are modified independent and supervision  with PT, modified independent and supervision with OT, modified  independent with SLP. Estimated rehab length of stay to reach the above functional goals is: 7 days Anticipated discharge destination: Home Overall Rehab/Functional Prognosis: excellent   POST ACUTE RECOMMENDATIONS: This patient's condition is appropriate for continued rehabilitative care in the following setting: CIR Patient has agreed to participate in recommended program. Yes Note that insurance prior authorization may be required for reimbursement for recommended care.   Comment: I think she is a good candidate for CIR, would like to confirm disposition. Rehab coordinator to follow up.        Thanks,   Lylia Sand, MD 09/27/2023

## 2023-09-28 NOTE — Treatment Plan (Signed)
 Report given to Isa Manuel on 4 MW. Pt to transfer to 06 when room clean

## 2023-09-28 NOTE — Discharge Instructions (Addendum)
 Inpatient Rehab Discharge Instructions  Erica [name] Discharge date and time: No discharge date for patient encounter.   Activities/Precautions/ Functional Status: Activity: As tolerated Diet: Diabetic diet Wound Care: Routine skin checks Functional status:  ___ No restrictions     ___ Walk up steps independently ___ 24/7 supervision/assistance   ___ Walk up steps with assistance ___ Intermittent supervision/assistance  ___ Bathe/dress independently ___ Walk with walker     _x__ Bathe/dress with assistance ___ Walk Independently    ___ Shower independently ___ Walk with assistance    ___ Shower with assistance ___ No alcohol      ___ Return to work/school ________  Special Instructions: No driving smoking or alcohol   Continue aspirin  through 10/16/2023 and stop    COMMUNITY REFERRALS UPON DISCHARGE:     Outpatient: PT    OT      SP             Agency:[facility] [address] Phone: [phone]             Appointment Date/Time:WILL CALL TO SCHEDULE FOLLOW UP APPOINTMENTS  Medical Equipment/Items Ordered:HAS ALL NEEDED EQUIPMENT FROM PAST ADMISSIONS                                                 Agency/Supplier: NA  PCS AIDE VIA MAKING VISIONS [phone]  My questions have been answered and I understand these instructions. I will adhere to these goals and the provided educational materials after my discharge from the hospital.  Patient/Caregiver Signature _______________________________ Date __________  Clinician Signature _______________________________________ Date __________  Please bring this form and your medication list with you to all your follow-up doctor's appointments. STROKE/TIA DISCHARGE INSTRUCTIONS SMOKING Cigarette smoking nearly doubles your risk of having a stroke & is the single most alterable risk factor  If you smoke or have smoked in the last 12 months, you are advised to quit smoking for your health. Most of the excess cardiovascular  risk related to smoking disappears within a year of stopping. Ask you doctor about anti-smoking medications [place] Quit Line: 1-800-QUIT NOW Free Smoking Cessation Classes [phone]  CHOLESTEROL Know your levels; limit fat & cholesterol in your diet  Lipid Panel     Component Value Date/Time   CHOL 178 09/26/2023 0341   CHOL 132 03/08/2021 1143   TRIG 231 (H) 09/26/2023 0341   HDL 54 09/26/2023 0341   HDL 76 03/08/2021 1143   CHOLHDL 3.3 09/26/2023 0341   VLDL 46 (H) 09/26/2023 0341   LDLCALC 78 09/26/2023 0341   LDLCALC 31 07/16/2021 0000     Many patients benefit from treatment even if their cholesterol is at goal. Goal: Total Cholesterol (CHOL) less than 160 Goal:  Triglycerides (TRIG) less than 150 Goal:  HDL greater than 40 Goal:  LDL (LDLCALC) less than 100   BLOOD PRESSURE American Stroke Association blood pressure target is less that 120/80 mm/Hg  Your discharge blood pressure is:  BP: (!) 153/80 Monitor your blood pressure Limit your salt and alcohol  intake Many individuals will require more than one medication for high blood pressure  DIABETES (A1c is a blood sugar average for last 3 months) Goal HGBA1c is under 7% (HBGA1c is blood sugar average for last 3 months)  Diabetes:   Lab Results  Component Value Date   HGBA1C 5.6 09/26/2023  Your HGBA1c can be lowered with medications, healthy diet, and exercise. Check your blood sugar as directed by your physician Call your physician if you experience unexplained or low blood sugars.  PHYSICAL ACTIVITY/REHABILITATION Goal is 30 minutes at least 4 days per week  Activity: Increase activity slowly, Therapies: Physical Therapy: Home Health Return to work:  Activity decreases your risk of heart attack and stroke and makes your heart stronger.  It helps control your weight and blood pressure; helps you relax and can improve your mood. Participate in a regular exercise program. Talk with your doctor about the best form of  exercise for you (dancing, walking, swimming, cycling).  DIET/WEIGHT Goal is to maintain a healthy weight  Your discharge diet is:  Diet Order             Diet heart healthy/carb modified Fluid consistency: Thin  Diet effective now                   liquids Your height is:  Height: 5\' 2"  (157.5 cm) Your current weight is: Weight: 73.5 kg Your Body Mass Index (BMI) is:  BMI (Calculated): 29.63 Following the type of diet specifically designed for you will help prevent another stroke. Your goal weight range is:   Your goal Body Mass Index (BMI) is 19-24. Healthy food habits can help reduce 3 risk factors for stroke:  High cholesterol, hypertension, and excess weight.  RESOURCES Stroke/Support Group:  Call [phone]   STROKE EDUCATION PROVIDED/REVIEWED AND GIVEN TO PATIENT Stroke warning signs and symptoms How to activate emergency medical system (call 911). Medications prescribed at discharge. Need for follow-up after discharge. Personal risk factors for stroke. Pneumonia vaccine given: No Flu vaccine given: No My questions have been answered, the writing is legible, and I understand these instructions.  I will adhere to these goals & educational materials that have been provided to me after my discharge from the hospital.

## 2023-09-28 NOTE — Discharge Summary (Signed)
 Physician Discharge Summary  LAN ENTSMINGER WJX:[id number] DOB: Jul 17, 1955 DOA: 09/25/2023  PCP: Carolyn Cisco, NP  Admit date: 09/25/2023 Discharge date: 09/28/2023  Admitted From: (Home) Disposition:  (CIR)  Recommendations for Outpatient Follow-up:  Follow up with PCP in 1-2 weeks Adjust hypoglycemic regimen as needed, as both Lantus and Farxiga has been discontinued, Resume home dose amlodipine in 1 to 2 days as goal is normotension   Diet recommendation: Heart Healthy / Carb Modified   Brief/Interim Summary:  Erica [name] is a 68 y.o. 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  Her workup significant for acute CVA, please see discussion below.  Acute lacunar infarct of left thalamus History of recurrent stroke: - Right facial droop and tongue numbness consistent with MRI brain confirming acute lacunar infarct of the left thalamus - CTA head and neck negative for large vessel disease or high-grade stenosis - Echo EF 55 to 60% with notable left ventricle wall motion abnormalities left ventricular hypertrophy and grade 1 diastolic dysfunction without notable shunt - A1c within normal limits, lipid panel remarkable for elevated triglycerides and VLDL -Continue telemetry per protocol, patient with loop recorder, will await an update from EP about interrogation. - Patient is on aspirin at home, currently on aspirin and Plavix for 3 weeks, then Plavix alone .P2Y12 showed 161 though level, seems responding to plavix.  recommendation to continue Plavix per neurology. - PT OT speech following per protocol, awaiting CIR evaluation - EP has reviewed patient's loop recorder, there is no true A-fib episodes, some readings labeled as  "A-fib", but are false, as well none of these even since December, as well some readings labeled "asystole", again these are false. there is a noisy signal on the 14th , suspect is while she was in the MRI scanner, happened about 15:00    Insulin-dependent type 2 diabetes uncontrolled with hypoglycemia: -A1c low, concerning for tight control - Notably hypoglycemic in the ED at intake requiring D50, her A1c is 5.6, she was only on insulin sliding scale, while her Jardiance and Lantus has been discontinued, despite that she had some low CBG readings, so on discharge will discontinue her Lantus and Farxiga, this can be resumed as an outpatient if CBG started to increase, and she will only be kept on insulin sliding scale very sensitive protocol  Hypertension: - Long-term goal is normotensive, avoid low blood pressures, but overall blood pressure is stable during hospital stay, she had couple mildly elevated reading, so Norvasc will be discontinued at time of discharge, this can be resumed if blood pressure started to increase.   COPD/idiopathic pulmonary fibrosis: Stable.  Continue pirfenidone, Symbicort, and albuterol as needed.   CKD stage IIIb: Renal function at baseline   Hyperlipidemia: Continue rosuvastatin 40 mg oral daily, Zetia was added as LDL is 78   Depression/anxiety: Continue home medication    Discharge Diagnoses:  Principal Problem:   Acute lacunar infarction in the left thalamus [facility]) Active Problems:   History of CVA with residual deficit   Type 2 diabetes mellitus with hypoglycemia, with long-term current use of insulin (HCC)   Hypertension associated with diabetes (HCC)   COPD (chronic obstructive pulmonary disease) (HCC)   Idiopathic pulmonary fibrosis (HCC)   Acute CVA (cerebrovascular accident) (HCC)   Chronic kidney disease, stage  3b (HCC)   Hyperlipidemia associated with type 2 diabetes mellitus [facility])    Discharge Instructions  Discharge Instructions      Diet - low sodium heart healthy   Complete by: As directed    Discharge instructions   Complete by: As directed    Management per CIR   Increase activity slowly   Complete by: As directed       Allergies as of 09/28/2023       Reactions   Penicillins Hives, Itching, Swelling   Tongue swelling        Medication List     STOP taking these medications    amLODipine 10 MG tablet Commonly known as: NORVASC   Farxiga 10 MG Tabs tablet Generic drug: dapagliflozin propanediol   Lantus SoloStar 100 UNIT/ML Solostar Pen Generic drug: insulin glargine       TAKE these medications    acetaminophen 325 MG tablet Commonly known as: TYLENOL Take 2 tablets (650 mg total) by mouth every 4 (four) hours as needed for mild pain (pain score 1-3) (or temp > 37.5 C (99.5 F)).   albuterol 108 (90 Base) MCG/ACT inhaler Commonly known as: VENTOLIN HFA Inhale 2 puffs into the lungs every 4 (four) hours as needed for wheezing. What changed: when to take this   aspirin EC 81 MG tablet Take 1 tablet (81 mg total) by mouth daily for 19 days. Swallow whole.   cetirizine 10 MG tablet Commonly known as: ZYRTEC Take 1 tablet (10 mg total) by mouth daily.   clopidogrel 75 MG tablet Commonly known as: PLAVIX Take 75 mg by mouth daily.   diclofenac Sodium 1 % Gel Commonly known as: VOLTAREN Apply 1 Application topically 2 (two) times daily as needed (pain).   DULoxetine 30 MG capsule Commonly known as: CYMBALTA Take 30 mg by mouth daily.   ezetimibe 10 MG tablet Commonly known as: ZETIA Take 1 tablet (10 mg total) by mouth daily. Start taking on: September 29, 2023   gabapentin 100 MG capsule Commonly known as: NEURONTIN Take 1 capsule (100 mg total) by mouth 2 (two) times daily. Patient is taking as needed What changed:  when to take this reasons to take this additional instructions   hydrALAZINE 25 MG tablet Commonly known as: APRESOLINE SMARTSIG:1.5 Tablet(s) By Mouth Every 8  Hours   insulin aspart 100 UNIT/ML injection Commonly known as: novoLOG Inject 0-6 Units into the skin 3 (three) times daily with meals.   naphazoline-glycerin 0.012-0.25 % Soln Commonly known as: CLEAR EYES REDNESS Place 1-2 drops into both eyes 3 (three) times daily with meals. What changed:  how much to take when to take this reasons to take this   pantoprazole 40 MG tablet Commonly known as: PROTONIX Take 40 mg by mouth daily.   Pirfenidone 801 MG Tabs TAKE 1 TABLET (801 MG) BY MOUTH WITH BREAKFAST, WITH LUNCH, AND WITH EVENING MEAL. What changed: See the new instructions.   rosuvastatin 40 MG tablet Commonly known as: CRESTOR Take 1 tablet (40 mg total) by mouth daily.   sertraline 50 MG tablet Commonly known as: ZOLOFT Take 1 tablet (50 mg total) by mouth daily.   Symbicort 160-4.5 MCG/ACT inhaler Generic drug: budesonide-formoterol INHALE 2 PUFFS INTO THE LUNGS TWICE DAILY   triamcinolone cream 0.1 % Commonly known as: KENALOG Apply 1 Application topically daily.   venlafaxine XR 150 MG 24 hr capsule Commonly known as: EFFEXOR-XR Take 1 capsule (150 mg total) by mouth daily.  Follow-up Information     Johny Nap, NP. Go on 11/02/2023.   Specialty: Neurology Why: stroke clinic Contact information: [address] [place] [address] [phone]                Allergies  Allergen Reactions   Penicillins Hives, Itching and Swelling    Tongue swelling    Consultations: Neurology Inpatient rehab   Procedures/Studies: ECHOCARDIOGRAM COMPLETE Result Date: 09/26/2023    ECHOCARDIOGRAM REPORT   Patient Name:   ARISTEA POSADA Date of Exam: 09/26/2023 Medical Rec #:  [id number]    Height:       62.0 in Accession #:    [id number]   Weight:       159.0 lb Date of Birth:  May 25, 1956    BSA:          1.734 m Patient Age:    68 years     BP:           140/87 mmHg Patient Gender: F            HR:           67 bpm. Exam Location:  Inpatient  Procedure: 2D Echo, Cardiac Doppler, Color Doppler and Intracardiac            Opacification Agent (Both Spectral and Color Flow Doppler were            utilized during procedure). Indications:    Stroke  History:        Patient has prior history of Echocardiogram examinations, most                 recent 03/05/2023. COPD; Risk Factors:Hypertension, Diabetes,                 Dyslipidemia and Former Smoker.  Sonographer:    Reta Cassis Referring Phys: [id number] VISHAL R PATEL  Sonographer Comments: Technically difficult study due to poor echo windows and patient is obese. Image acquisition challenging due to COPD. IMPRESSIONS  1. Left ventricular ejection fraction, by estimation, is 55 to 60%. The left ventricle has normal function. The left ventricle demonstrates regional wall motion abnormalities (see scoring diagram/findings for description). There is mild left ventricular  hypertrophy. Left ventricular diastolic parameters are consistent with Grade I diastolic dysfunction (impaired relaxation).  2. Right ventricular systolic function is normal. The right ventricular size is normal.  3. Left atrial size was mildly dilated.  4. The mitral valve is normal in structure. No evidence of mitral valve regurgitation. No evidence of mitral stenosis.  5. The aortic valve is calcified. There is moderate calcification of the aortic valve. There is moderate thickening of the aortic valve. Aortic valve regurgitation is mild. Aortic valve sclerosis is present, with no evidence of aortic valve stenosis. Aortic valve mean gradient measures 4.0 mmHg. Aortic valve Vmax measures 1.36 m/s.  6. The inferior vena cava is normal in size with greater than 50% respiratory variability, suggesting right atrial pressure of 3 mmHg. FINDINGS  Left Ventricle: Left ventricular ejection fraction, by estimation, is 55 to 60%. The left ventricle has normal function. The left ventricle demonstrates regional wall motion abnormalities. Definity contrast  agent was given IV to delineate the left ventricular endocardial borders. The left ventricular internal cavity size was normal in size. There is mild left ventricular hypertrophy. Left ventricular diastolic parameters are consistent with Grade I diastolic dysfunction (impaired relaxation).  LV Wall Scoring: The basal inferior segment is akinetic. The basal inferoseptal segment is  hypokinetic. Right Ventricle: The right ventricular size is normal. No increase in right ventricular wall thickness. Right ventricular systolic function is normal. Left Atrium: Left atrial size was mildly dilated. Right Atrium: Right atrial size was normal in size. Pericardium: There is no evidence of pericardial effusion. Mitral Valve: The mitral valve is normal in structure. No evidence of mitral valve regurgitation. No evidence of mitral valve stenosis. Tricuspid Valve: The tricuspid valve is normal in structure. Tricuspid valve regurgitation is not demonstrated. No evidence of tricuspid stenosis. Aortic Valve: The aortic valve is calcified. There is moderate calcification of the aortic valve. There is moderate thickening of the aortic valve. Aortic valve regurgitation is mild. Aortic valve sclerosis is present, with no evidence of aortic valve stenosis. Aortic valve mean gradient measures 4.0 mmHg. Aortic valve peak gradient measures 7.4 mmHg. Aortic valve area, by VTI measures 1.89 cm. Pulmonic Valve: The pulmonic valve was normal in structure. Pulmonic valve regurgitation is not visualized. No evidence of pulmonic stenosis. Aorta: The aortic root is normal in size and structure. Venous: The inferior vena cava is normal in size with greater than 50% respiratory variability, suggesting right atrial pressure of 3 mmHg. IAS/Shunts: No atrial level shunt detected by color flow Doppler.  LEFT VENTRICLE PLAX 2D LVIDd:         3.90 cm   Diastology LVIDs:         2.50 cm   LV e' medial:    3.26 cm/s LV PW:         1.20 cm   LV E/e' medial:   16.2 LV IVS:        1.40 cm   LV e' lateral:   5.22 cm/s LVOT diam:     2.00 cm   LV E/e' lateral: 10.1 LV SV:         55 LV SV Index:   32 LVOT Area:     3.14 cm  RIGHT VENTRICLE             IVC RV Basal diam:  3.60 cm     IVC diam: 1.40 cm RV S prime:     10.60 cm/s TAPSE (M-mode): 2.9 cm LEFT ATRIUM             Index        RIGHT ATRIUM           Index LA diam:        3.80 cm 2.19 cm/m   RA Area:     16.10 cm LA Vol (A2C):   69.5 ml 40.08 ml/m  RA Volume:   39.40 ml  22.72 ml/m LA Vol (A4C):   44.8 ml 25.83 ml/m LA Biplane Vol: 55.8 ml 32.18 ml/m  AORTIC VALVE AV Area (Vmax):    1.82 cm AV Area (Vmean):   1.81 cm AV Area (VTI):     1.89 cm AV Vmax:           136.00 cm/s AV Vmean:          90.500 cm/s AV VTI:            0.289 m AV Peak Grad:      7.4 mmHg AV Mean Grad:      4.0 mmHg LVOT Vmax:         79.00 cm/s LVOT Vmean:        52.200 cm/s LVOT VTI:          0.174 m LVOT/AV VTI ratio: 0.60  AORTA Ao Root  diam: 3.20 cm Ao Asc diam:  3.10 cm MITRAL VALVE MV Area (PHT): 3.17 cm     SHUNTS MV Decel Time: 239 msec     Systemic VTI:  0.17 m MV E velocity: 52.80 cm/s   Systemic Diam: 2.00 cm MV A velocity: 102.00 cm/s MV E/A ratio:  0.52 Dorothye Gathers MD Electronically signed by Dorothye Gathers MD Signature Date/Time: 09/26/2023/2:46:16 PM    Final    MR BRAIN WO CONTRAST Result Date: 09/25/2023 CLINICAL DATA:  Neuro deficit, concern for stroke. Left-sided symptoms, left facial numbness, slurred speech. EXAM: MRI HEAD WITHOUT CONTRAST TECHNIQUE: Multiplanar, multiecho pulse sequences of the brain and surrounding structures were obtained without intravenous contrast. COMPARISON:  Same day CT head and CTA head and neck. MRI head 03/18/2023. FINDINGS: Brain: Punctate focus of restricted diffusion involving the left thalamus partially extending into the cerebral peduncle compatible with acute lacunar infarct. No additional areas of acute infarct pre shaded. Focal encephalomalacia in the right cerebral hemisphere  compatible with remote infarcts. Mild associated susceptibility suggestive of prior hemorrhage. Additional small focus of susceptibility in the left middle frontal gyrus with encephalomalacia and gliosis compatible with remote infarct and remote hemorrhage. Additional remote infarcts in the right frontal lobe, bilateral occipital lobes, small remote infarcts in the left cerebellum, and remote lacunar infarcts in the bilateral basal ganglia and bilateral thalami. Scattered and confluent FLAIR signal abnormality in the periventricular and subcortical white matter suggestive of moderate chronic microvascular ischemic changes. No midline shift. The basilar cisterns are patent. No extra-axial fluid collections. Ventricles: Prominence of the lateral ventricles suggestive of underlying parenchymal volume loss. Vascular: Skull base flow voids are visualized. Skull and upper cervical spine: No focal abnormality. Sinuses/Orbits: Orbits are symmetric. Paranasal sinuses are clear. Other: Mastoid air cells are clear. IMPRESSION: Acute lacunar infarct in the left thalamus. Multiple additional remote infarcts as above, similar to 2024. Moderate chronic microvascular ischemic changes and mild parenchymal volume loss. These results were communicated to [facility] at 5:48 pm on 09/25/2023 by text page via the [facility] messaging system. Electronically Signed   By: Denny Flack M.D.   On: 09/25/2023 17:49   CT ANGIO HEAD NECK W WO CM (CODE STROKE) Result Date: 09/25/2023 CLINICAL DATA:  Provided history: Neuro deficit, acute, stroke suspected. Additional history obtained from electronic MEDICAL RECORD NUMBERFacial numbness, slurred speech. EXAM: CT ANGIOGRAPHY HEAD AND NECK WITH AND WITHOUT CONTRAST TECHNIQUE: Multidetector CT imaging of the head and neck was performed using the standard protocol during bolus administration of intravenous contrast. Multiplanar CT image reconstructions and MIPs were obtained to evaluate the vascular  anatomy. Carotid stenosis measurements (when applicable) are obtained utilizing NASCET criteria, using the distal internal carotid diameter as the denominator. RADIATION DOSE REDUCTION: This exam was performed according to the departmental dose-optimization program which includes automated exposure control, adjustment of the mA and/or kV according to patient size and/or use of iterative reconstruction technique. CONTRAST:  75mL OMNIPAQUE IOHEXOL 300 MG/ML  SOLN COMPARISON:  Non-contrast head CT performed earlier today 09/25/2023. CT angiogram head/neck 03/18/2023. FINDINGS: CTA NECK FINDINGS Aortic arch: Standard aortic branching. The visualized thoracic aorta is normal in caliber. Streak/beam hardening artifact arising from a dense contrast bolus partially obscures the left subclavian artery. Within this limitation, there is no appreciable hemodynamically significant innominate or proximal subclavian artery stenosis. Right carotid system: CCA and ICA patent within the neck without stenosis or significant atherosclerotic disease. No evidence of dissection. Left carotid system: Beam hardening artifact arising from a dense  contrast bolus partially obscures the proximal CCA. Within this limitation, the CCA and ICA are patent within the neck without stenosis or significant atherosclerotic disease. No evidence of dissection. Vertebral arteries: Codominant and patent within the neck without stenosis or significant atherosclerotic disease. No evidence of dissection. Skeleton: Spondylosis at the cervical and visualized upper thoracic levels. No acute fracture or aggressive osseous lesion. Other neck: No neck mass or cervical lymphadenopathy. Upper chest: No consolidation within the imaged lung apices. Centrilobular paraseptal emphysema. Incompletely assessed fibrotic lung disease. Review of the MIP images confirms the above findings CTA HEAD FINDINGS Anterior circulation: The intracranial internal carotid arteries are  patent. Atherosclerotic plaque within both vessels with no more than mild stenosis. The M1 middle cerebral arteries are patent. No M2 proximal branch occlusion or high-grade proximal stenosis. The anterior cerebral arteries are patent. No intracranial aneurysm is identified. Posterior circulation: The intracranial vertebral arteries are patent. The basilar artery is patent. 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 The posterior cerebral arteries are patent. Posterior communicating arteries are diminutive or absent, bilaterally. Venous sinuses: Within the limitations of contrast timing, no convincing thrombus. Anatomic variants: As described. Review of the MIP images confirms the above findings No emergent large vessel occlusion identified. These results were communicated to [facility] at 12:10 pmon 4/14/2025by text page via the [facility] messaging system. IMPRESSION: CTA neck: 1. Artifact arising from a dense contrast bolus partially obscures the proximal left common carotid artery. Within this limitation, the common carotid, internal carotid and vertebral arteries are patent within the neck without stenosis or significant atherosclerotic disease. No evidence of dissection. 2. Incompletely assessed fibrotic lung disease. 3. Emphysema (ICD10-J43.9). CTA head: 1. No proximal intracranial large vessel occlusion or high-grade proximal arterial stenosis identified. 2. Atherosclerotic plaque within the intracranial internal carotid arteries with no more than mild stenosis. Electronically Signed   By: Jackey Loge D.O.   On: 09/25/2023 12:10   CT HEAD CODE STROKE WO CONTRAST Result Date: 09/25/2023 CLINICAL DATA:  Code stroke. EXAM: CT HEAD WITHOUT CONTRAST TECHNIQUE: Contiguous axial images were obtained from the base of the skull through the vertex without intravenous contrast. RADIATION DOSE REDUCTION: This exam was performed according to the departmental dose-optimization program which  includes automated exposure control, adjustment of the mA and/or kV according to patient size and/or use of iterative reconstruction technique. COMPARISON:  06/09/2023 FINDINGS: Brain: Scattered areas of gray-white differentiation loss in the bilateral occipital parietal cortex and in the anterior and superior frontal lobes. Small chronic infarcts in the bilateral cerebellum. Chronic lacunar infarcts in the bilateral thalamus and caudate. Ischemic gliosis is present in the bilateral cerebral white matter. No evidence of acute infarct, hemorrhage, hydrocephalus, or mass. Vascular: No hyperdense vessel or unexpected calcification. Skull: Normal. Negative for fracture or focal lesion. Sinuses/Orbits: No acute finding. IMPRESSION: 1. No acute finding. 2. Numerous small cortical, cerebellar, and lacunar infarcts as seen on head CT 06/09/2023. Electronically Signed   By: Tiburcio Pea M.D.   On: 09/25/2023 11:34   CUP PACEART REMOTE DEVICE CHECK Result Date: 09/14/2023 ILR summary report received. Battery status OK. Normal device function. No new symptom, tachy, brady, or pause episodes. No new AF episodes.  Recordings list: 1 Asystole on 09/05/2023, no EGMs, sent to traige    Monthly summary reports and ROV/PRN ML, CVRS     Subjective:  No significant events overnight, she denies any complaints today Discharge Exam: Vitals:   09/28/23 0048 09/28/23 0400  BP: (!) 146/78 133/63  Pulse:  66  Resp:  17  Temp:  98 F (36.7 C)  SpO2:  98%   Vitals:   09/27/23 1634 09/27/23 2018 09/28/23 0048 09/28/23 0400  BP:  (!) 142/78 (!) 146/78 133/63  Pulse: 69 71  66  Resp:  (!) 23  17  Temp:    98 F (36.7 C)  TempSrc:  Oral Oral Oral  SpO2: 99% 100%  98%  Weight:      Height:        General: Pt is alert, awake, not in acute distress Cardiovascular: RRR, S1/S2 +, no rubs, no gallops Respiratory: CTA bilaterally, no wheezing, no rhonchi Abdominal: Soft, NT, ND, bowel sounds + Extremities: no edema,  no cyanosis    The results of significant diagnostics from this hospitalization (including imaging, microbiology, ancillary and laboratory) are listed below for reference.     Microbiology: No results found for this or any previous visit (from the past 240 hours).   Labs: BNP (last 3 results) Recent Labs    01/06/23 1642  BNP 44.6   Basic Metabolic Panel: Recent Labs  Lab 09/25/23 1130 09/25/23 1330 09/26/23 0340 09/27/23 0903  NA 137 135 141 140  K 6.8* 5.0 4.8 4.7  CL 112* 109 112* 108  CO2  --  20* 22 21*  GLUCOSE 83 171* 138* 187*  BUN 55* 34* 32* 26*  CREATININE 2.00* 1.62* 1.65* 1.67*  CALCIUM  --  8.2* 8.4* 8.8*   Liver Function Tests: Recent Labs  Lab 09/25/23 1330  AST 21  ALT 12  ALKPHOS 89  BILITOT 0.5  PROT 6.2*  ALBUMIN 3.1*   No results for input(s): "LIPASE", "AMYLASE" in the last 168 hours. No results for input(s): "AMMONIA" in the last 168 hours. CBC: Recent Labs  Lab 09/25/23 1126 09/25/23 1130 09/26/23 0340 09/27/23 0903  WBC 5.0  --  4.5 4.8  NEUTROABS 3.5  --   --   --   HGB 12.0 11.2* 11.7* 12.1  HCT 34.2* 33.0* 33.0* 33.8*  MCV 88.8  --  88.7 87.3  PLT 157  --  126* 127*   Cardiac Enzymes: No results for input(s): "CKTOTAL", "CKMB", "CKMBINDEX", "TROPONINI" in the last 168 hours. BNP: Invalid input(s): "POCBNP" CBG: Recent Labs  Lab 09/27/23 0749 09/27/23 1229 09/27/23 1626 09/27/23 2017 09/28/23 0837  GLUCAP 203* 52* 189* 282* 216*   D-Dimer No results for input(s): "DDIMER" in the last 72 hours. Hgb A1c Recent Labs    09/26/23 0340  HGBA1C 5.6   Lipid Profile Recent Labs    09/26/23 0341  CHOL 178  HDL 54  LDLCALC 78  TRIG 231*  CHOLHDL 3.3   Thyroid function studies No results for input(s): "TSH", "T4TOTAL", "T3FREE", "THYROIDAB" in the last 72 hours.  Invalid input(s): "FREET3" Anemia work up No results for input(s): "VITAMINB12", "FOLATE", "FERRITIN", "TIBC", "IRON", "RETICCTPCT" in the last  72 hours. Urinalysis    Component Value Date/Time   COLORURINE COLORLESS (A) 09/25/2023 1218   APPEARANCEUR CLEAR 09/25/2023 1218   LABSPEC 1.012 09/25/2023 1218   PHURINE 6.0 09/25/2023 1218   GLUCOSEU >=500 (A) 09/25/2023 1218   HGBUR NEGATIVE 09/25/2023 1218   BILIRUBINUR NEGATIVE 09/25/2023 1218   KETONESUR NEGATIVE 09/25/2023 1218   PROTEINUR NEGATIVE 09/25/2023 1218   NITRITE NEGATIVE 09/25/2023 1218   LEUKOCYTESUR NEGATIVE 09/25/2023 1218   Sepsis Labs Recent Labs  Lab 09/25/23 1126 09/26/23 0340 09/27/23 0903  WBC 5.0 4.5 4.8   Microbiology No results found  for this or any previous visit (from the past 240 hours).   Time coordinating discharge: Over 30 minutes  SIGNED:   Seena Dadds, MD  [facility] 09/28/2023, 10:04 AM Pager   If 7PM-7AM, please contact night-coverage [email or web address] Password [email or web address]

## 2023-09-28 NOTE — Discharge Instructions (Signed)
Management per CIR 

## 2023-09-28 NOTE — TOC Transition Note (Signed)
 Transition of Care [facility]) - Discharge Note   Patient Details  Name: Erica [name] MRN: [id number] Date of Birth: [birth year]  Transition of Care [facility]) CM/SW Contact:  Jannice Mends, LCSW Phone Number: 09/28/2023, 11:07 AM   Clinical Narrative:    Patient discharging to CIR today. No further TOC needs identified.    Final next level of care: IP Rehab Facility Barriers to Discharge: Barriers Resolved   Patient Goals and CMS Choice            Discharge Placement                       Discharge Plan and Services Additional resources added to the After Visit Summary for                                       Social Drivers of Health (SDOH) Interventions SDOH Screenings   Food Insecurity: No Food Insecurity (09/26/2023)  Housing: Low Risk  (09/26/2023)  Transportation Needs: No Transportation Needs (09/26/2023)  Utilities: Not At Risk (09/26/2023)  Depression (PHQ2-9): Low Risk  (04/08/2022)  Social Connections: Moderately Isolated (09/26/2023)  Tobacco Use: Medium Risk (09/26/2023)     Readmission Risk Interventions    03/07/2023   10:54 AM  Readmission Risk Prevention Plan  Transportation Screening Complete  HRI or Home Care Consult Complete  Social Work Consult for Recovery Care Planning/Counseling Complete  Palliative Care Screening Complete  Medication Review Oceanographer) Complete

## 2023-09-28 NOTE — Progress Notes (Signed)
 IP rehab admissions - We have received approval for inpatient rehab admission.  I have a bed available today and will admit to CIR today.  I have attending MD clearance for admit to CIR today.  [phone]

## 2023-09-28 NOTE — Progress Notes (Signed)
 PMR Admission Coordinator Pre-Admission Assessment   Patient: Erica [name] is an 68 y.o., female MRN: [id number] DOB: [birth year] Height: 5\' 2"  (157.5 cm) Weight: 72.1 kg   Insurance Information HMO: yes    PPO:      PCP:      IPA:      80/20:      OTHER:  PRIMARY: [employer] Dual complete       Policy#: [id number]      Subscriber: pt CM Name: Marylene Land      Phone#: [phone]     Fax#: [phone] Pre-Cert#: [id number] approved 09/28/23 to 10/04/23, review due on 10/04/23      Employer: Retired Benefits:  Phone #: [phone]     Name: Verified on line Eff. Date: 09/12/23-06/12/24       Deduct: $ 257      Out of Pocket Max: $9,350 ($0 met)      Life Max: n/a          CIR: $1,565 copay per admission      SNF: $0 for days 1-20, $209.50 per day for days 21-100 Outpatient: 80%     Co-Pay: 20% Home Health: 100%      Co-Pay: none DME: 80%     Co-Pay: 20% Providers: in network    SECONDARY: Medicaid of [place]       Policy#: [id number]     Phone#: [phone]   Financial Counselor:       Phone#:    The "Data Collection Information Summary" for patients in Inpatient Rehabilitation Facilities with attached "Privacy Act Statement-Health Care Records" was provided and verbally reviewed with: Pt    Emergency Contact Information Contact Information       Name Relation Home Work [place] Son [phone]             Other Contacts       Name Relation Home Work Mobile    [place] Sister [phone]        Jeffries,Crystal Niece     [phone]           Current Medical History  Patient Admitting Diagnosis: L CVA    History of Present Illness: Erica [name] is a 68 y.o. female with past medical history of hypertension, depression, diabetes, COPD, prior strokes, rectal cancer who was admitted to [facility] ED on 09/25/23 with left thalamic CVA after she developed right facial and tongue numbness and altered speech.  Patient was seen by neurology and  aspirin 81 mg was added to Plavix 75 mg.  She had a MRI on 09/25/2023 that showed acute lacunar infarct of the left thalamus, multiple additional remote strokes and chronic moderate microvascular ischemic changes.  Zetia was added to Crestor.  Patient was treated in CIR last year also after CVA.  She has been seen by speech therapy for impaired speech and cognition.  Patient was seen by physical therapy with deficits in the min assist/mod assist level with CIR recommended.   Complete NIHSS TOTAL: 0   Patient's medical record from [facility]  has been reviewed by the rehabilitation admission coordinator and physician.   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 No   Family History   family history includes Cancer in her maternal aunt, maternal uncle, maternal uncle, maternal uncle, paternal aunt, and paternal uncle; Cancer (age of onset: 56) in an other family member; Diabetes in her father; Heart disease in her mother; Hyperlipidemia in her mother; Hypertension in her brother, brother, father, mother, sister, and sister; Stroke in her father.   Current Medications  Current Medications    Current Facility-Administered  Medications:    acetaminophen (TYLENOL) tablet 650 mg, 650 mg, Oral, Q4H PRN, 650 mg at 09/27/23 1125 **OR** acetaminophen (TYLENOL) 160 MG/5ML solution 650 mg, 650 mg, Per Tube, Q4H PRN **OR** acetaminophen (TYLENOL) suppository 650 mg, 650 mg, Rectal, Q4H PRN, Allena Katz, Vishal R, MD   albuterol (PROVENTIL) (2.5 MG/3ML) 0.083% nebulizer solution 3 mL, 3 mL, Inhalation, Q4H PRN, Darreld Mclean R, MD   aspirin EC tablet 81 mg, 81 mg, Oral, Daily, Darreld Mclean R, MD, 81 mg at 09/28/23 0925   clopidogrel (PLAVIX) tablet 75 mg, 75 mg, Oral, Daily, Darreld Mclean R, MD, 75 mg at 09/28/23 1610   ezetimibe (ZETIA) tablet 10 mg, 10 mg, Oral, Daily, Marvel Plan, MD, 10 mg at 09/28/23 9604   gabapentin (NEURONTIN) capsule 100 mg, 100 mg, Oral, Daily PRN, Darreld Mclean R, MD   heparin injection 5,000 Units, 5,000 Units, Subcutaneous, Q8H, Darreld Mclean R, MD, 5,000 Units at 09/28/23 5409   hydrALAZINE (APRESOLINE) injection 10 mg, 10 mg, Intravenous, Q4H PRN, Darreld Mclean R, MD   hydrocortisone cream 0.5 %, , Topical, TID, Elgergawy, Leana Roe, MD, Given at 09/28/23 0928   insulin aspart (novoLOG) injection 0-6 Units, 0-6 Units, Subcutaneous, TID WC, Elgergawy, Leana Roe, MD   mometasone-formoterol (DULERA) 200-5 MCG/ACT inhaler 2 puff, 2 puff, Inhalation, BID, Darreld Mclean R, MD, 2 puff at 09/28/23 0744   ondansetron (ZOFRAN) injection 4 mg, 4 mg, Intravenous, Q6H PRN, Allena Katz, Vishal R, MD   pantoprazole (PROTONIX) EC tablet 40 mg, 40 mg, Oral, Daily, Darreld Mclean R, MD, 40 mg at 09/28/23 8119   Pirfenidone TABS 801 mg, 1 tablet, Oral, TID WC, Patel, Vishal R, MD   rosuvastatin (CRESTOR) tablet 40 mg, 40 mg, Oral, Daily, Allena Katz, Vishal R, MD, 40 mg at 09/28/23 0925   senna-docusate (Senokot-S) tablet 1 tablet, 1 tablet, Oral, QHS PRN, Darreld Mclean R, MD   sertraline (ZOLOFT) tablet 50 mg, 50 mg, Oral, Daily, Darreld Mclean R, MD, 50 mg at 09/28/23 0924   venlafaxine XR (EFFEXOR-XR) 24 hr capsule 150 mg, 150 mg,  Oral, Daily, Darreld Mclean R, MD, 150 mg at 09/28/23 1478     Patients Current Diet:  Diet Order  Diet - low sodium heart healthy             Diet heart healthy/carb modified Fluid consistency: Thin  Diet effective now                         Precautions / Restrictions Precautions Precautions: Fall Restrictions Weight Bearing Restrictions Per Provider Order: No    Has the patient had 2 or more falls or a fall with injury in the past year? No   Prior Activity Level Community (5-7x/wk): Pt. active in the community PTA   Prior Functional Level Self Care: Did the patient need help bathing, dressing, using the toilet or eating? Independent   Indoor Mobility: Did the patient need assistance with walking from room to room (with or without device)? Independent   Stairs: Did the patient need assistance with internal or external stairs (with or without device)? Independent   Functional Cognition: Did the patient need help planning regular tasks such as shopping or remembering to take medications? Needed some help   Patient Information Are you of Hispanic, Latino/a,or Spanish origin?: A. No, not of Hispanic, Latino/a, or Spanish origin What is your race?: B. Black or African American Do you need or want an interpreter to communicate with a doctor or health care staff?: 0. No   Patient's Response To:  Health Literacy and Transportation Is the patient able to respond to health literacy and transportation needs?: Yes Health Literacy - How often do you need to have someone help you when you read instructions, pamphlets, or other written material from your doctor or pharmacy?: Never In the past 12 months, has lack of transportation kept you from medical appointments or from getting medications?: No In the past 12 months, has lack of transportation kept you from meetings, work, or from getting things needed for daily living?: No   Journalist, newspaper /  Equipment Home Equipment: Gilmer Mor - single point, Information systems manager, Agricultural consultant (2 wheels)   Prior Device Use: Indicate devices/aids used by the patient prior to current illness, exacerbation or injury? Walker   Current Functional Level Cognition   Arousal/Alertness: Awake/alert Overall Cognitive Status: Impaired/Different from baseline Orientation Level: Oriented X4 Attention: Sustained Sustained Attention: Impaired Sustained Attention Impairment: Verbal basic, Functional basic Memory: Impaired Memory Impairment: Storage deficit, Retrieval deficit Awareness: Appears intact Problem Solving: Impaired Problem Solving Impairment: Verbal basic, Functional basic Safety/Judgment: Appears intact    Extremity Assessment (includes Sensation/Coordination)   Upper Extremity Assessment: LUE deficits/detail LUE Deficits / Details: overall good strength/ROM, increased time for motor planning LUE Coordination: decreased fine motor  Lower Extremity Assessment: Defer to PT evaluation RLE Deficits / Details: AROM WFL and strength grossly 4/5 LLE Deficits / Details: AROM A for full knee flexion, strength grossly 4-/5, ankle DF 3-/5     ADLs   Overall ADL's : Needs assistance/impaired Eating/Feeding: Modified independent, Sitting Grooming: Set up, Sitting Upper Body Bathing: Set up, Sitting Lower Body Bathing: Minimal assistance, Sitting/lateral leans Upper Body Dressing : Set up, Sitting Lower Body Dressing: Minimal assistance, Sit to/from stand Toilet Transfer: Minimal assistance, Stand-pivot, Rolling walker (2 wheels), BSC/3in1 Toileting- Clothing Manipulation and Hygiene: Minimal assistance, Sit to/from stand General ADL Comments: Pt min A for sit to stand using RW and transfer to [facility]. Pt min A for LB dressing/bathing, instructed on use of sock aide, increased time to use.     Mobility   Overal bed mobility: Needs Assistance Bed Mobility: Supine to Sit, Sit  to Supine Supine to sit: Min assist,  HOB elevated Sit to supine: Min assist General bed mobility comments: min A in/out of bed     Transfers   Overall transfer level: Needs assistance Equipment used: Rolling walker (2 wheels) Transfers: Sit to/from Stand, Bed to chair/wheelchair/BSC Sit to Stand: Min assist Bed to/from chair/wheelchair/BSC transfer type:: Step pivot Step pivot transfers: Min assist General transfer comment: min A for STS, left buckled a little at times with pt using RW     Ambulation / Gait / Stairs / Wheelchair Mobility   Ambulation/Gait Ambulation/Gait assistance: Editor, commissioning (Feet): 20 Feet (20 feet x 2) Assistive device: Rolling walker (2 wheels) Gait Pattern/deviations: Step-to pattern, Step-through pattern, Decreased stride length, Trunk flexed, Decreased dorsiflexion - left, Knee flexed in stance - left General Gait Details: slow pace, increased L knee flexion in stance with distance, reported feeling light headed, though BP stable from supine to sitting; reported headache that started with ambulation. Only able to ambulate to bathroom and back. Needed assist for cleaning and pulling Depends down and back up. Gait velocity interpretation: <1.31 ft/sec, indicative of household ambulator     Posture / Balance Balance Overall balance assessment: Needs assistance Sitting-balance support: No upper extremity supported, Feet supported Sitting balance-Leahy Scale: Fair Standing balance support: Bilateral upper extremity supported, During functional activity, Reliant on assistive device for balance Standing balance-Leahy Scale: Poor Standing balance comment: knee left buckled a little, reliant on RW for support     Special needs/care consideration none    Previous Home Environment (from acute therapy documentation) Living Arrangements: Alone  Lives With: Family Available Help at Discharge: Family, Available PRN/intermittently Type of Home: Apartment Home Layout: One level Home Access:  Level entry Bathroom Shower/Tub: Engineer, manufacturing systems: Standard Bathroom Accessibility: Yes How Accessible: Accessible via walker Home Care Services: No Additional Comments: has an aide 3x/week 2 hours, Son comes by daily to check on Pt.   Discharge Living Setting Plans for Discharge Living Setting: Apartment Type of Home at Discharge: Apartment Discharge Home Layout: One level Discharge Home Access: Level entry Discharge Bathroom Shower/Tub: Tub/shower unit Discharge Bathroom Toilet: Handicapped height Discharge Bathroom Accessibility: Yes How Accessible: Accessible via wheelchair, Accessible via walker Does the patient have any problems obtaining your medications?: No   Social/Family/Support Systems Patient Roles: Other (Comment) Contact Information: [phone] Anticipated Caregiver: [name] (sister) Anticipated Caregiver's Contact Information: Sister and son can provide 24/7 min a Ability/Limitations of Caregiver: 24/7 Caregiver Availability: 24/7 Discharge Plan Discussed with Primary Caregiver: Yes Is Caregiver In Agreement with Plan?: Yes Does Caregiver/Family have Issues with Lodging/Transportation while Pt is in Rehab?: No   Goals Patient/Family Goal for Rehab: PT/OT/SLP mod I and  Supervision goals Expected length of stay: 7 days Pt/Family Agrees to Admission and willing to participate: Yes Program Orientation Provided & Reviewed with Pt/Caregiver Including Roles  & Responsibilities: Yes   Decrease burden of Care through IP rehab admission: not anticipated   Possible need for SNF placement upon discharge: not anticipated   Patient Condition: This patient's condition remains as documented in the consult dated 09/27/23, in which the Rehabilitation Physician determined and documented that the patient's condition is appropriate for intensive rehabilitative care in an inpatient rehabilitation facility. Will admit to inpatient rehab today.   Preadmission  Screen Completed By:  Trish Mage, 09/28/2023 10:05 AM ______________________________________________________________________   Discussed status with Dr. Riley Kill on 09/28/23 at 0930 and received approval for admission today.   Admission Coordinator:  Chilton Couch, RN, time 1011/Date 09/28/23    Assessment/Plan: Diagnosis: L thalamic CVA Does the need for close, 24 hr/day Medical supervision in concert with the patient's rehab needs make it unreasonable for this patient to be served in a less intensive setting? Yes Co-Morbidities requiring supervision/potential complications: Labile blood sugars with diabetes, hypertension, COPD, CKD stage IIIb, thrombocytopenia Due to bladder management, bowel management, safety, skin/wound care, disease management, medication administration, pain management, and patient education, does the patient require 24 hr/day rehab nursing? Yes Does the patient require coordinated care of a physician, rehab nurse, PT, OT, and SLP to address physical and functional deficits in the context of the above medical diagnosis(es)? Yes Addressing deficits in the following areas: balance, endurance, locomotion, strength, transferring, bathing, dressing, feeding, grooming, toileting, cognition, and language Can the patient actively participate in an intensive therapy program of at least 3 hrs of therapy 5 days a week? Yes The potential for patient to make measurable gains while on inpatient rehab is excellent Anticipated functional outcomes upon discharge from inpatient rehab: supervision PT, supervision OT, supervision SLP Estimated rehab length of stay to reach the above functional goals is: 5-7 days Anticipated discharge destination: Home 10. Overall Rehab/Functional Prognosis: good     MD Signature:   Bea Lime, DO 09/28/2023          Revision History

## 2023-09-28 NOTE — Progress Notes (Signed)
 Inpatient Rehabilitation Admission Medication Review by a Pharmacist  A complete drug regimen review was completed for this patient to identify any potential clinically significant medication issues.  High Risk Drug Classes Is patient taking? Indication by Medication  Antipsychotic No   Anticoagulant Yes SQH- DVT ppx   Antibiotic No   Opioid No   Antiplatelet Yes bASA/plavix  x 3 weeks then plavix  alone- started 4/15 (EOT for bASA 5/5)   Hypoglycemics/insulin  Yes Insulin - DM   Vasoactive Medication No   Chemotherapy No   Other Yes Senokot-S  and - constipation Pantoprazole - reflux  Acetaminophen - pain  gabapentin - neuropathy  albuterol - wheezing/SOB  Crestor , Zetia - HLD  Pirfenidone - IPF (home medication that needs to be brought in to the hospital) Sertraline , venlafaxine - mood   Breo ellipta - COPD      Type of Medication Issue Identified Description of Issue Recommendation(s)  Drug Interaction(s) (clinically significant)     Duplicate Therapy  Of note- there are more recent fills for Duloxetine + Sertraline  as opposed to Venlafaxine  + Sertraline - Venlafaxine  resumed from inpatient encounter    Allergy     No Medication Administration End Date     Incorrect Dose     Additional Drug Therapy Needed     Significant med changes from prior encounter (inform family/care partners about these prior to discharge). New: Zetia  Communicate relevant medication changes to patient/family members at discharge from CIR.   Restart or discontinue PTA meds not resumed in CIR at discharge if clinically indicated.   Other  Awaiting patient's home supply of pirfenidone - have not received confirmation about whether supply can be brought in as of yet   PTA meds not resumed: Zyrtec , hydralazine , triamcinolone cream, duloxetine  Medications discontinued inpatient:  amlodipine  (fill hx does not support use), Farxiga, Lantus  (CBGs controlled, A1c 5.6)     Clinically significant medication issues  were identified that warrant physician communication and completion of prescribed/recommended actions by midnight of the next day:  No  Name of provider notified for urgent issues identified:   Provider Method of Notification:     Pharmacist comments:   Time spent performing this drug regimen review (minutes):  30   Chrystie Crass, PharmD Clinical Pharmacist  09/28/2023 1:05 PM

## 2023-09-29 DIAGNOSIS — E1149 Type 2 diabetes mellitus with other diabetic neurological complication: Secondary | ICD-10-CM | POA: Diagnosis not present

## 2023-09-29 DIAGNOSIS — I6381 Other cerebral infarction due to occlusion or stenosis of small artery: Secondary | ICD-10-CM | POA: Diagnosis not present

## 2023-09-29 DIAGNOSIS — Z794 Long term (current) use of insulin: Secondary | ICD-10-CM

## 2023-09-29 LAB — COMPREHENSIVE METABOLIC PANEL WITH GFR
ALT: 11 U/L (ref 0–44)
AST: 18 U/L (ref 15–41)
Albumin: 3.2 g/dL — ABNORMAL LOW (ref 3.5–5.0)
Alkaline Phosphatase: 76 U/L (ref 38–126)
Anion gap: 8 (ref 5–15)
BUN: 35 mg/dL — ABNORMAL HIGH (ref 8–23)
CO2: 19 mmol/L — ABNORMAL LOW (ref 22–32)
Calcium: 8.8 mg/dL — ABNORMAL LOW (ref 8.9–10.3)
Chloride: 112 mmol/L — ABNORMAL HIGH (ref 98–111)
Creatinine, Ser: 1.86 mg/dL — ABNORMAL HIGH (ref 0.44–1.00)
GFR, Estimated: 29 mL/min — ABNORMAL LOW (ref >60)
Glucose, Bld: 127 mg/dL — ABNORMAL HIGH (ref 70–99)
Potassium: 4.6 mmol/L (ref 3.5–5.1)
Sodium: 139 mmol/L (ref 135–145)
Total Bilirubin: 0.8 mg/dL (ref 0.0–1.2)
Total Protein: 6.4 g/dL — ABNORMAL LOW (ref 6.5–8.1)

## 2023-09-29 LAB — GLUCOSE, CAPILLARY
Glucose-Capillary: 227 mg/dL — ABNORMAL HIGH (ref 70–99)
Glucose-Capillary: 82 mg/dL (ref 70–99)
Glucose-Capillary: 140 mg/dL — ABNORMAL HIGH (ref 70–99)
Glucose-Capillary: 201 mg/dL — ABNORMAL HIGH (ref 70–99)

## 2023-09-29 LAB — CBC WITH DIFFERENTIAL/PLATELET
Abs Immature Granulocytes: 0.02 10*3/uL (ref 0.00–0.07)
Basophils Absolute: 0 10*3/uL (ref 0.0–0.1)
Basophils Relative: 0 %
Eosinophils Absolute: 0.1 10*3/uL (ref 0.0–0.5)
Eosinophils Relative: 2 %
HCT: 32.1 % — ABNORMAL LOW (ref 36.0–46.0)
Hemoglobin: 11.6 g/dL — ABNORMAL LOW (ref 12.0–15.0)
Immature Granulocytes: 1 %
Lymphocytes Relative: 23 %
Lymphs Abs: 0.9 10*3/uL (ref 0.7–4.0)
MCH: 31.7 pg (ref 26.0–34.0)
MCHC: 36.1 g/dL — ABNORMAL HIGH (ref 30.0–36.0)
MCV: 87.7 fL (ref 80.0–100.0)
Monocytes Absolute: 0.3 10*3/uL (ref 0.1–1.0)
Monocytes Relative: 7 %
Neutro Abs: 2.7 10*3/uL (ref 1.7–7.7)
Neutrophils Relative %: 67 %
Platelets: 114 10*3/uL — ABNORMAL LOW (ref 150–400)
RBC: 3.66 MIL/uL — ABNORMAL LOW (ref 3.87–5.11)
RDW: 15.2 % (ref 11.5–15.5)
WBC: 4 10*3/uL (ref 4.0–10.5)
nRBC: 0 % (ref 0.0–0.2)

## 2023-09-29 MED ORDER — INSULIN GLARGINE-YFGN 100 UNIT/ML ~~LOC~~ SOPN: 10.0000 [IU] | PEN_INJECTOR | Freq: Every day | SUBCUTANEOUS | Status: DC

## 2023-09-29 MED ORDER — INSULIN GLARGINE-YFGN 100 UNIT/ML ~~LOC~~ SOLN
10.0000 [IU] | Freq: Every day | SUBCUTANEOUS | Status: DC
  Administered 2023-09-29 – 2023-10-09 (×11): 10 [IU] via SUBCUTANEOUS
  Filled 2023-09-29 (×12): qty 0.1

## 2023-09-29 NOTE — Plan of Care (Signed)
 Problem: RH Balance Goal: LTG Patient will maintain dynamic standing with ADLs (OT) Description: LTG:  Patient will maintain dynamic standing balance with assist during activities of daily living (OT)  Flowsheets (Taken 09/29/2023 1243) LTG: Pt will maintain dynamic standing balance during ADLs with: Independent with assistive device   Problem: Sit to Stand Goal: LTG:  Patient will perform sit to stand in prep for activites of daily living with assistance level (OT) Description: LTG:  Patient will perform sit to stand in prep for activites of daily living with assistance level (OT) Flowsheets (Taken 09/29/2023 1243) LTG: PT will perform sit to stand in prep for activites of daily living with assistance level: Independent with assistive device   Problem: RH Grooming Goal: LTG Patient will perform grooming w/assist,cues/equip (OT) Description: LTG: Patient will perform grooming with assist, with/without cues using equipment (OT) Flowsheets (Taken 09/29/2023 1243) LTG: Pt will perform grooming with assistance level of: Independent with assistive device    Problem: RH Bathing Goal: LTG Patient will bathe all body parts with assist levels (OT) Description: LTG: Patient will bathe all body parts with assist levels (OT) Flowsheets (Taken 09/29/2023 1243) LTG: Pt will perform bathing with assistance level/cueing: Supervision/Verbal cueing   Problem: RH Dressing Goal: LTG Patient will perform upper body dressing (OT) Description: LTG Patient will perform upper body dressing with assist, with/without cues (OT). Flowsheets (Taken 09/29/2023 1243) LTG: Pt will perform upper body dressing with assistance level of: Independent with assistive device Goal: LTG Patient will perform lower body dressing w/assist (OT) Description: LTG: Patient will perform lower body dressing with assist, with/without cues in positioning using equipment (OT) Flowsheets (Taken 09/29/2023 1243) LTG: Pt will perform lower body  dressing with assistance level of: Independent with assistive device   Problem: RH Toileting Goal: LTG Patient will perform toileting task (3/3 steps) with assistance level (OT) Description: LTG: Patient will perform toileting task (3/3 steps) with assistance level (OT)  Flowsheets (Taken 09/29/2023 1243) LTG: Pt will perform toileting task (3/3 steps) with assistance level: Independent with assistive device   Problem: RH Functional Use of Upper Extremity Goal: LTG Patient will use RT/LT upper extremity as a (OT) Description: LTG: Patient will use right/left upper extremity as a stabilizer/gross assist/diminished/nondominant/dominant level with assist, with/without cues during functional activity (OT) Flowsheets (Taken 09/29/2023 1243) LTG: Use of upper extremity in functional activities: LUE as nondominant level   Problem: RH Simple Meal Prep Goal: LTG Patient will perform simple meal prep w/assist (OT) Description: LTG: Patient will perform simple meal prep with assistance, with/without cues (OT). Flowsheets (Taken 09/29/2023 1243) LTG: Pt will perform simple meal prep with assistance level of: Independent with assistive device   Problem: RH Toilet Transfers Goal: LTG Patient will perform toilet transfers w/assist (OT) Description: LTG: Patient will perform toilet transfers with assist, with/without cues using equipment (OT) Flowsheets (Taken 09/29/2023 1243) LTG: Pt will perform toilet transfers with assistance level of: Independent with assistive device   Problem: RH Tub/Shower Transfers Goal: LTG Patient will perform tub/shower transfers w/assist (OT) Description: LTG: Patient will perform tub/shower transfers with assist, with/without cues using equipment (OT) Flowsheets (Taken 09/29/2023 1243) LTG: Pt will perform tub/shower stall transfers with assistance level of: Supervision/Verbal cueing   Problem: RH Memory Goal: LTG Patient will demonstrate ability for day to day recall/carry  over during activities of daily living with assistance level (OT) Description: LTG:  Patient will demonstrate ability for day to day recall/carry over during activities of daily living with assistance level (OT). Flowsheets (  Taken 09/29/2023 1243) LTG:  Patient will demonstrate ability for day to day recall/carry over during activities of daily living with assistance level (OT): Supervision

## 2023-09-29 NOTE — Evaluation (Signed)
 Physical Therapy Assessment and Plan  Patient Details  Name: Erica [name] MRN: [id number] Date of Birth: Jul 10, 1955  PT Diagnosis: Difficulty walking, Dizziness and giddiness, Hemiparesis non-dominant, and Muscle weakness Rehab Potential: Good ELOS: 5 - 7 days   Today's Date: 09/29/2023 PT Individual Time: 4098-1191 PT Individual Time Calculation (min): 73 min    Hospital Problem: Principal Problem:   Left thalamic infarction [facility])   Past Medical History:  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   GERD (gastroesophageal reflux disease)    with chronic cough   Headache(784.0)    "mild"   Hypertension    Loop Biotronik loop implant 12/16/2020 12/16/2020   Mental disorder    depression   Neuropathy    feet    Pain    arthritis pain - takes tramadol  as needed   Pulmonary fibrosis, unspecified (HCC)    Rectal cancer (HCC)    Rectal polyp    very little bleeding with bowel movements- no pain   Stroke (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   Suicide attempt [facility])    Past Surgical History:  Past Surgical History:  Procedure Laterality Date   ANAL RECTAL MANOMETRY N/A 07/13/2016   Procedure: ANO RECTAL MANOMETRY;  Surgeon: Joyce Nixon, MD;  [facility];  Service: Endoscopy;  Laterality: N/A;   CESAREAN SECTION     EUS N/A 11/21/2012   Procedure: LOWER ENDOSCOPIC ULTRASOUND (EUS);  Surgeon: Evangeline Hilts, MD;  Location: [facility];  Service: Endoscopy;  Laterality: N/A;   FLEXIBLE SIGMOIDOSCOPY N/A 11/21/2012   Procedure: FLEXIBLE SIGMOIDOSCOPY;  Surgeon: Evangeline Hilts, MD;  [facility];  Service: Endoscopy;  Laterality: N/A;   FLEXIBLE SIGMOIDOSCOPY N/A 01/28/2013   Procedure: FLEXIBLE SIGMOIDOSCOPY;  Surgeon: Joyce Nixon, MD;  [facility];  Service: Endoscopy;  Laterality: N/A;    LAPAROSCOPIC LOW ANTERIOR RESECTION N/A 01/29/2013   Procedure: LAPAROSCOPIC LOW ANTERIOR RESECTION, Rigid Proctoscopy;  Surgeon: Joyce Nixon, MD;  [facility];  Service: General;  Laterality: N/A;   LAPAROSCOPIC SIGMOID COLECTOMY N/A 11/14/2012   Procedure: DIAGNOSTIC LAPAROSCOPY AND SIGMOIDMOIDOSCOPY ;  Surgeon: Enid Harry, MD;  [facility];  Service: General;  Laterality: N/A;   RECTAL ULTRASOUND N/A 07/13/2016   Procedure: RECTAL ULTRASOUND;  Surgeon: Joyce Nixon, MD;  [facility];  Service: Endoscopy;  Laterality: N/A;   TONSILLECTOMY     TONSILLECTOMY AND ADENOIDECTOMY      Assessment & Plan Clinical Impression: Patient is a 68 y.o. female with history of hypertension, type 2 diabetes mellitus, CKD stage III, COPD, GERD, rectal cancer undergoing laparoscopic low anterior resection 2014 followed by oncology services Dr. 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 Per chart review patient lives alone. 1 level apartment. She has an aide 3 times a week 2 hours a day. She has a son in the area that checks on her routinely. She uses a Youth worker for mobility. Presented 09/25/2023 with acute onset of right facial/tongue numbness and slurred speech. CT/MRI showed acute lacunar infarct  in the left thalamus. Multiple additional remote infarcts. CTA of the head showed no large vessel occlusion.CTA of the neck showed no dissection. Admission chemistries unremarkable except potassium 6.8 chloride 112 BUN 55 creatinine 2.00, hemoglobin A1c 6.9. Echocardiogram with ejection fraction of 55 to 60% grade 1 diastolic dysfunction. Neurology follow-up patient  currently on low-dose aspirin  and Plavix  for CVA prophylaxis x 3 weeks then Plavix  alone. Subcu heparin  added for DVT prophylaxis. Her potassium was corrected with latest potassium 4.7 and creatinine remained stable at 1.67. Therapy evaluations completed due to patient's decreased functional mobility was admitted for a comprehensive rehab program. Patient transferred to CIR on 09/28/2023 .   Patient currently requires  CGA/ MinA  with mobility secondary to muscle weakness, decreased cardiorespiratoy endurance, impaired timing and sequencing, unbalanced muscle activation, and decreased motor planning, decreased problem solving and delayed processing, and decreased standing balance and decreased balance strategies.  Prior to hospitalization, patient was modified independent  with mobility and lived with Family in a Apartment home.  Home access is 2Stairs to enter.  Patient will benefit from skilled PT intervention to maximize safe functional mobility, minimize fall risk, and decrease caregiver burden for planned discharge home with intermittent assist.  Anticipate patient will benefit from follow up [facility] at discharge.  PT - End of Session Activity Tolerance: Tolerates 30+ min activity with multiple rests Endurance Deficit: Yes PT Assessment Rehab Potential (ACUTE/IP ONLY): Good PT Barriers to Discharge: Decreased caregiver support;Home environment access/layout;Insurance for SNF coverage PT Patient demonstrates impairments in the following area(s): Balance;Endurance;Motor;Perception;Safety PT Transfers Functional Problem(s): Bed Mobility;Bed to Chair;Car;Furniture PT Locomotion Functional Problem(s): Ambulation;Stairs PT Plan PT Intensity: Minimum of 1-2 x/day ,45 to 90 minutes PT Frequency: 5 out of 7 days PT Duration Estimated Length of Stay: 5 - 7 days PT Treatment/Interventions: Ambulation/gait training;Community reintegration;DME/adaptive equipment instruction;Neuromuscular  re-education;Psychosocial support;Stair training;UE/LE Strength taining/ROM;Balance/vestibular training;Discharge planning;Pain management;Skin care/wound management;Therapeutic Activities;UE/LE Coordination activities;Cognitive remediation/compensation;Disease management/prevention;Functional mobility training;Patient/family education;Splinting/orthotics;Therapeutic Exercise PT Transfers Anticipated Outcome(s): IND PT Locomotion Anticipated Outcome(s): IND PT Recommendation Recommendations for Other Services: Therapeutic Recreation consult Therapeutic Recreation Interventions: Stress management;Kitchen group;Outing/community reintergration Follow Up Recommendations: Home health PT;24 hour supervision/assistance Patient destination: Home Equipment Recommended: To be determined   PT Evaluation Precautions/Restrictions Precautions Precautions: Fall Precaution/Restrictions Comments: h/o L sided weakness from previous stroke Restrictions Weight Bearing Restrictions Per Provider Order: No General   Vital SignsTherapy Vitals Temp: 97.6 F (36.4 C) Temp Source: Oral Pulse Rate: 63 Resp: 16 BP: (!) 144/78 Patient Position (if appropriate): Sitting Oxygen Therapy SpO2: 100 % O2 Device: Room Air Pain Pain Assessment Pain Scale: 0-10 Pain Score: 0-No pain Pain Interference Pain Interference Pain Effect on Sleep: 0. Does not apply - I have not had any pain or hurting in the past 5 days Pain Interference with Therapy Activities: 1. Rarely or not at all Pain Interference with Day-to-Day Activities: 1. Rarely or not at all Home Living/Prior Functioning Home Living Living Arrangements: Alone Available Help at Discharge: Family;Available PRN/intermittently Type of Home: Apartment Home Access: Stairs to enter Entrance Stairs-Number of Steps: 2 Entrance Stairs-Rails: Right Home Layout: One level Bathroom Shower/Tub: Engineer, manufacturing systems: Standard Bathroom Accessibility:  Yes Additional Comments: has an aide 3x/week for 2 hrs/ visit, Son comes by daily to check on Pt.  Lives With: Family Prior Function Level of Independence: Needs assistance with ADLs;Needs assistance with homemaking;Requires assistive device for independence;Independent with transfers  Able to Take Stairs?: Yes Driving: No Vocation: Retired Optometrist - History Ability to See in  Adequate Light: 1 Impaired Perception Perception: Within Functional Limits Praxis Praxis: Impaired Praxis Impairment Details: Motor planning  Cognition Overall Cognitive Status: History of cognitive impairments - at baseline (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, however Pt reporting worse memory, attention, EF skills, and awareness) Arousal/Alertness: Awake/alert Orientation Level: Oriented X4 Attention: Selective Sustained Attention: Appears intact Selective Attention: Impaired Memory: Impaired Memory Impairment: Storage deficit;Retrieval deficit Awareness: Appears intact Executive Function:  (slowed, however intact for BADLs) Safety/Judgment: Appears intact Sensation Sensation Light Touch: Appears Intact Hot/Cold: Appears Intact Proprioception: Appears Intact Coordination Gross Motor Movements are Fluid and Coordinated: No Fine Motor Movements are Fluid and Coordinated: No Coordination and Movement Description: mild instability in standing with decreased step length and L knee instability Heel Shin Test: slow but WFL Motor  Motor Motor: Other (comment) (mild L hemiparesis with residual L hemipareisis) Motor - Skilled Clinical Observations: Mild balance and L LE strength deficits   Trunk/Postural Assessment  Cervical Assessment Cervical Assessment: Exceptions to [facility] (mild forward head) Thoracic Assessment Thoracic Assessment: Exceptions to [facility] (mild rounded shoulders) Lumbar Assessment Lumbar Assessment: Exceptions to [facility] Postural Control Postural Control: Deficits on  evaluation Righting Reactions: delayed Protective Responses: delayed  Balance Balance Balance Assessed: Yes Standardized Balance Assessment Standardized Balance Assessment: Timed Up and Go Test Timed Up and Go Test TUG: Normal TUG Normal TUG (seconds): 44.52 Static Sitting Balance Static Sitting - Balance Support: Feet supported;No upper extremity supported Static Sitting - Level of Assistance: 5: Stand by assistance Dynamic Sitting Balance Dynamic Sitting - Balance Support: Feet supported;During functional activity Dynamic Sitting - Level of Assistance: 5: Stand by assistance Static Standing Balance Static Standing - Balance Support: During functional activity;Right upper extremity supported Static Standing - Level of Assistance: Other (comment) (CGA) Dynamic Standing Balance Dynamic Standing - Balance Support: Right upper extremity supported;During functional activity Dynamic Standing - Level of Assistance: 4: Min assist Extremity Assessment      RLE Assessment Active Range of Motion (AROM) Comments: WFL General Strength Comments: grossly 4/5 proximal to distal LLE Assessment LLE Assessment: Exceptions to [facility] Active Range of Motion (AROM) Comments: WFL General Strength Comments: grossly 4- to 4/5 proximal to distal  Care Tool Care Tool Bed Mobility Roll left and right activity   Roll left and right assist level: Supervision/Verbal cueing    Sit to lying activity   Sit to lying assist level: Supervision/Verbal cueing    Lying to sitting on side of bed activity   Lying to sitting on side of bed assist level: the ability to move from lying on the back to sitting on the side of the bed with no back support.: Supervision/Verbal cueing     Care Tool Transfers Sit to stand transfer   Sit to stand assist level: Contact Guard/Touching assist    Chair/bed transfer   Chair/bed transfer assist level: Contact Guard/Touching assist    Car transfer   Car transfer assist  level: Contact Guard/Touching assist      Care Tool Locomotion Ambulation   Assist level: Contact Guard/Touching assist Assistive device: Cane-straight Max distance: 170 ft  Walk 10 feet activity   Assist level: Supervision/Verbal cueing Assistive device: Cane-straight   Walk 50 feet with 2 turns activity   Assist level: Contact Guard/Touching assist Assistive device: Cane-straight  Walk 150 feet activity   Assist level: Contact Guard/Touching assist Assistive device: Cane-straight  Walk 10 feet on uneven surfaces activity   Assist level: Contact Guard/Touching assist Assistive device: Cane-straight  Stairs   Assist level: Minimal  Assistance - Patient > 75% Stairs assistive device: 2 hand rails Max number of stairs: 8  Walk up/down 1 step activity   Walk up/down 1 step (curb) assist level: Contact Guard/Touching assist Walk up/down 1 step or curb assistive device: 2 hand rails  Walk up/down 4 steps activity   Walk up/down 4 steps assist level: Minimal Assistance - Patient > 75% Walk up/down 4 steps assistive device: 2 hand rails  Walk up/down 12 steps activity Walk up/down 12 steps activity did not occur: Safety/medical concerns (fatigue/ increased weakness)      Pick up small objects from floor   Pick up small object from the floor assist level: Moderate Assistance - Patient 50 - 74% Pick up small object from the floor assistive device: [facility]  Wheelchair Is the patient using a wheelchair?: Yes Type of Wheelchair: Manual   Wheelchair assist level: Dependent - Patient 0% Max wheelchair distance: 175 ft  Wheel 50 feet with 2 turns activity   Assist Level: Dependent - Patient 0%  Wheel 150 feet activity   Assist Level: Dependent - Patient 0%    Refer to Care Plan for Long Term Goals  SHORT TERM GOAL WEEK 1 PT Short Term Goal 1 (Week 1): STG = LTG d/t ELOS  Recommendations for other services: Therapeutic Recreation  Kitchen group and Stress management  Skilled  Therapeutic Intervention Mobility Bed Mobility Bed Mobility: Rolling Right;Rolling Left;Supine to Sit;Sit to Supine Rolling Right: Supervision/verbal cueing Rolling Left: Supervision/Verbal cueing Supine to Sit: Supervision/Verbal cueing Sit to Supine: Supervision/Verbal cueing Transfers Transfers: Sit to Stand;Stand to Sit;Stand Pivot Transfers Sit to Stand: Contact Guard/Touching assist Stand to Sit: Contact Guard/Touching assist Stand Pivot Transfers: Contact Guard/Touching assist Transfer (Assistive device): Straight cane Locomotion  Gait Ambulation: Yes Gait Distance (Feet): 170 Feet Assistive device: Straight cane Gait Gait: Yes Gait Pattern: Impaired Gait Pattern: Decreased stance time - left;Left flexed knee in stance;Trunk flexed Gait velocity: decreased Stairs / Additional Locomotion Stairs: Yes Stairs Assistance: Minimal Assistance - Patient > 75% Stair Management Technique: Two rails;Alternating pattern;Forwards Number of Stairs: 12 Height of Stairs: 6 Wheelchair Mobility Wheelchair Mobility: No  Skilled Intervention: PT Evaluation completed; see above for results. PT educated patient in roles of PT vs OT, PT POC, rehab potential, rehab goals, and discharge recommendations along with recommendation for follow-up rehabilitation services. Individual treatment initiated:  Patient supine in bed upon PT arrival. Patient alert and agreeable to PT session. Dressed in blue scrub clothes and pt relates family has not yet brought clothes.   No pain complaint during session.  All bed mobility, transfers, gait performed as related above with no AD and overall supervision/ CGA.   Car transfer performed to pt-related height of sister's car with light CGA and good technique with minimal cueing required. Is able to ambulate throughout department from gym to gym with slightly slower pace than her self-indicated "normal" pace. Does relate intermittent feeling of "brain freeze"  feeling in R side of head that she relates to "having another stroke". Attempted to disassociate feeling with thought of indication of another stroke, but related importance of pt to inform staff when feeling initiates.   Neuromuscular Re-ed: NMR facilitated during session with focus on balance in gait as well as activity tolerance. Pt guided in performance of Time Up and Go test. TUG completed in avg of 44.5 sec (A score of >13.5 seconds indicates patient is at a high fall risk. Pt educated on interpretation of their score).Pt with difficulty in instructions and  attempting to perform all trials on reaching chair. So one trial with increased time. Another trial with increased time d/t fatigue. Expected to improve by end of stay.   NMR performed for improvements in motor control and coordination, balance, sequencing, judgement, and self confidence/ efficacy in performing all aspects of mobility at highest level of independence.   Patient supine in bed at end of session with brakes locked, bed alarm set, and all needs within reach.    Discharge Criteria: Patient will be discharged from PT if patient refuses treatment 3 consecutive times without medical reason, if treatment goals not met, if there is a change in medical status, if patient makes no progress towards goals or if patient is discharged from hospital.  The above assessment, treatment plan, treatment alternatives and goals were discussed and mutually agreed upon: by patient  Donne Gage PT, DPT, CSRS 09/29/2023, 5:10 PM

## 2023-09-29 NOTE — Progress Notes (Signed)
 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   GERD (gastroesophageal reflux disease)    with chronic cough   Headache(784.0)    mild   Hypertension    Loop Biotronik loop implant 12/16/2020 12/16/2020   Mental disorder    depression   Neuropathy    feet    Pain    arthritis pain - takes tramadol  as needed   Pulmonary fibrosis, unspecified (HCC)    Rectal cancer (HCC)    Rectal polyp    very little bleeding with bowel movements- no pain   Stroke (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   Suicide attempt [facility])    Past Surgical History:  Past Surgical History:  Procedure Laterality Date   ANAL RECTAL MANOMETRY N/A 07/13/2016   Procedure: ANO RECTAL MANOMETRY;  Surgeon: Bernarda Ned, MD;  [facility];  Service: Endoscopy;  Laterality: N/A;   CESAREAN SECTION     EUS N/A 11/21/2012   Procedure: LOWER ENDOSCOPIC ULTRASOUND (EUS);  Surgeon: Elsie Cree, MD;  Location: [facility];  Service: Endoscopy;  Laterality: N/A;   FLEXIBLE SIGMOIDOSCOPY N/A 11/21/2012   Procedure: FLEXIBLE SIGMOIDOSCOPY;  Surgeon: Elsie Cree, MD;  [facility];  Service: Endoscopy;  Laterality: N/A;   FLEXIBLE SIGMOIDOSCOPY N/A 01/28/2013   Procedure: FLEXIBLE SIGMOIDOSCOPY;  Surgeon: Bernarda Ned, MD;  [facility];  Service: Endoscopy;  Laterality: N/A;   LAPAROSCOPIC LOW ANTERIOR RESECTION N/A 01/29/2013   Procedure: LAPAROSCOPIC LOW ANTERIOR RESECTION, Rigid Proctoscopy;  Surgeon: Bernarda Ned, MD;  [facility];  Service: General;  Laterality: N/A;    LAPAROSCOPIC SIGMOID COLECTOMY N/A 11/14/2012   Procedure: DIAGNOSTIC LAPAROSCOPY AND SIGMOIDMOIDOSCOPY ;  Surgeon: Donnice Bury, MD;  [facility];  Service: General;  Laterality: N/A;   RECTAL ULTRASOUND N/A 07/13/2016   Procedure: RECTAL ULTRASOUND;  Surgeon: Bernarda Ned, MD;  [facility];  Service: Endoscopy;  Laterality: N/A;   TONSILLECTOMY     TONSILLECTOMY AND ADENOIDECTOMY     Social History:  reports that she quit smoking about 5 years ago. Her smoking use included cigarettes. She started smoking about 25 years ago. She has a 10 pack-year smoking history. She has never used smokeless tobacco. She reports that she does not currently use alcohol . She reports that she does not currently use drugs after having used the following drugs: Crack cocaine.  Family / Support Systems Marital Status: Single Patient Roles: Parent, Other (Comment) (sibling) Children: Augie [phone] Other Supports: [name]-sister [phone] Anticipated Caregiver: [place] and [place] Ability/Limitations of Caregiver: Both will check on pt and sister will assist with bathing but not be there 24/7 Caregiver Availability: Intermittent Family Dynamics: Close with son and sister and they have assisted her in the past and will again. But they do not provide 24/7 care. Pt wants to ne mod/i like she has been and plans to be she is very independent.  Social History Preferred language: English Religion: None Cultural Background: NA Education: HS Health Literacy - How often do you need to have someone help you when you read instructions, pamphlets, or other written material from  your doctor or pharmacy?: Never Writes: Yes Employment Status: Retired Marine Scientist Issues: NA Guardian/Conservator: None-according to MD pt is capable of making her decisions while here.   Abuse/Neglect Abuse/Neglect Assessment Can Be Completed: Yes Physical Abuse: Denies Verbal Abuse: Denies Sexual Abuse:  Denies Exploitation of patient/patient's resources: Denies Self-Neglect: Denies  Patient response to: Social Isolation - How often do you feel lonely or isolated from those around you?: Rarely  Emotional Status Pt's affect, behavior and adjustment status: Pt is motivated to do well and was here in 2022 after her other stroke, she got to supervision level and was home alone with family checking on on as they were prior to admission. 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 She has been through this before and understnads what to expect. Premorbid pt/family roles/activities: mom, sister, retiree, neighbor, etc Anticipated changes in roles/activities/participation: resume Pt/family expectations/goals: Pt states:  I hope to get back to the level I was I plan on going home from here.  Community Resources [employer]: Other (Comment) (Been to SNF after last CVA, has [facility] aide 3 days for 2 hours per day) Premorbid Home Care/DME Agencies: Other (Comment) (rw, tub seat and cane) Transportation available at discharge: Scat uses and son and sister at times Is the patient able to respond to transportation needs?: Yes In the past 12 months, has lack of transportation kept you from medical appointments or from getting medications?: No In the past 12 months, has lack of transportation kept you from meetings, work, or from getting things needed for daily living?: No  Discharge  Planning Living Arrangements: Alone Support Systems: Children, Other relatives, Friends/neighbors Type of Residence: Private residence Insurance Resources: [employer] (specify) ([employer] Medicare dual complete) Financial Resources: Restaurant Manager, Fast Food Screen Referred: No Living Expenses: Rent Money Management: Patient Does the patient have any problems obtaining your medications?: No Home Management: aide and pt Patient/Family Preliminary Plans: Return home with sister and son checking in on her and her aide to resume. She feels between them she can manage and take care of herself when she is alone there. Care Coordinator Barriers to Discharge: Decreased caregiver support, Lack of/limited family support, Insurance for SNF coverage Care Coordinator Anticipated Follow Up Needs: HH/OP  Clinical Impression Pleasant female known to this worker since was here in 2022 after having a stroke. Will await team's evaluations and work on a safe discharge plan. Pt was going to OP neuro prior to admission to the hospital.  Raymonde Asberry MATSU 09/29/2023, 1:47 PM

## 2023-09-29 NOTE — Progress Notes (Signed)
 Inpatient Rehabilitation Center Individual Statement of Services  Patient Name:  DYMON SUMMERHILL  Date:  09/29/2023  Welcome to the [facility]patient [facility].  Our goal is to provide you with an individualized program based on your diagnosis and situation, designed to meet your specific needs.  With this comprehensive rehabilitation program, you will be expected to participate in at least 3 hours of rehabilitation therapies Monday-Friday, with modified therapy programming on the weekends.  Your rehabilitation program will include the following services:  Physical Therapy (PT), Occupational Therapy (OT), Speech Therapy (ST), 24 hour per day rehabilitation nursing, Therapeutic Recreaction (TR), Care Coordinator, Rehabilitation Medicine, Nutrition Services, and Pharmacy Services  Weekly team conferences will be held on Wednesday to discuss your progress.  Your Inpatient Rehabilitation Care Coordinator will talk with you frequently to get your input and to update you on team discussions.  Team conferences with you and your family in attendance may also be held.  Expected length of stay: 5-7 days  Overall anticipated outcome: Independent/supervision with tub  Depending on your progress and recovery, your program may change. Your Inpatient Rehabilitation Care Coordinator will coordinate services and will keep you informed of any changes. Your Inpatient Rehabilitation Care Coordinator's name and contact numbers are listed  below.  The following services may also be recommended but are not provided by the Inpatient Rehabilitation Center:   Home Health Rehabiltiation Services Outpatient Rehabilitation Services    Arrangements will be made to provide these services after discharge if needed.  Arrangements include referral to agencies that provide these services.  Your insurance has been verified to be:  [facility] Medicare dual complete Your primary doctor is:  Rojelio Daring  Pertinent information will  be shared with your doctor and your insurance company.  Inpatient Rehabilitation Care Coordinator:  Rhoda Clement, KEN [phone] or ELIGAH BASQUES  Information discussed with and copy given to patient by: Clement Asberry MATSU, 09/29/2023, 1:51 PM

## 2023-09-29 NOTE — Evaluation (Signed)
 Occupational Therapy Assessment and Plan  Patient Details  Name: Erica [name] MRN: [id number] Date of Birth: [birth year]  OT Diagnosis: cognitive deficits, hemiplegia affecting non-dominant side, and muscle weakness (generalized) Rehab Potential: Rehab Potential (ACUTE ONLY): Good ELOS: 5-7days   Today's Date: 09/29/2023 OT Individual Time: 9152-9040 OT Individual Time Calculation (min): 72 min     Hospital Problem: Principal Problem:   Left thalamic infarction [facility])   Past Medical History:  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   GERD (gastroesophageal reflux disease)    with chronic cough   Headache(784.0)    mild   Hypertension    Loop Biotronik loop implant 12/16/2020 12/16/2020   Mental disorder    depression   Neuropathy    feet    Pain    arthritis pain - takes tramadol  as needed   Pulmonary fibrosis, unspecified (HCC)    Rectal cancer (HCC)    Rectal polyp    very little bleeding with bowel movements- no pain   Stroke (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   Suicide attempt [facility])    Past Surgical History:  Past Surgical History:  Procedure Laterality Date   ANAL RECTAL MANOMETRY N/A 07/13/2016   Procedure: ANO RECTAL MANOMETRY;  Surgeon: Bernarda Ned, MD;  Location: [facility];  Service: Endoscopy;  Laterality: N/A;   CESAREAN SECTION     EUS N/A 11/21/2012   Procedure: LOWER ENDOSCOPIC ULTRASOUND (EUS);  Surgeon: Elsie Cree, MD;  Location: [facility];  Service: Endoscopy;  Laterality: N/A;   FLEXIBLE SIGMOIDOSCOPY N/A 11/21/2012   Procedure: FLEXIBLE SIGMOIDOSCOPY;  Surgeon: Elsie Cree, MD;  Location: [facility];  Service: Endoscopy;  Laterality: N/A;   FLEXIBLE SIGMOIDOSCOPY N/A 01/28/2013   Procedure: FLEXIBLE SIGMOIDOSCOPY;  Surgeon: Bernarda Ned, MD;  Location: [facility];  Service:  Endoscopy;  Laterality: N/A;   LAPAROSCOPIC LOW ANTERIOR RESECTION N/A 01/29/2013   Procedure: LAPAROSCOPIC LOW ANTERIOR RESECTION, Rigid Proctoscopy;  Surgeon: Bernarda Ned, MD;  Location: [facility];  Service: General;  Laterality: N/A;   LAPAROSCOPIC SIGMOID COLECTOMY N/A 11/14/2012   Procedure: DIAGNOSTIC LAPAROSCOPY AND SIGMOIDMOIDOSCOPY ;  Surgeon: Donnice Bury, MD;  Location: [facility];  Service: General;  Laterality: N/A;   RECTAL ULTRASOUND N/A 07/13/2016   Procedure: RECTAL ULTRASOUND;  Surgeon: Bernarda Ned, MD;  Location: [facility];  Service: Endoscopy;  Laterality: N/A;   TONSILLECTOMY     TONSILLECTOMY AND ADENOIDECTOMY      Assessment & Plan Clinical Impression: Erica [name] is a 68 year old right-handed female with history of hypertension, type 2 diabetes mellitus, CKD stage III, COPD, GERD, rectal cancer undergoing laparoscopic low anterior resection 2014 followed by oncology services Dr. 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 Per chart review patient lives alone. 1 level apartment. She has an aide 3 times a week 2 hours a day. She has a son in the area that checks on her routinely. She uses a youth worker for mobility. Presented 09/25/2023 with acute onset of right facial/tongue numbness and slurred speech. CT/MRI  showed acute lacunar infarct in the left thalamus. Multiple additional remote infarcts. CTA of the head showed no large vessel occlusion.CTA of the neck showed no dissection. Admission chemistries unremarkable except potassium 6.8 chloride 112 BUN 55 creatinine 2.00, hemoglobin A1c 6.9. Echocardiogram with ejection fraction of 55 to 60% grade  1 diastolic dysfunction. Neurology follow-up patient currently on low-dose aspirin  and Plavix  for CVA prophylaxis x 3 weeks then Plavix  alone. Subcu heparin  added for DVT prophylaxis. Her potassium was corrected with latest potassium 4.7 and creatinine remained stable at 1.67.  Patient transferred to CIR on 09/28/2023 .    Patient currently requires CGA-min with basic self-care skills and IADL secondary to muscle weakness, decreased cardiorespiratoy endurance, unbalanced muscle activation, decreased coordination, and decreased motor planning, decreased motor planning, decreased attention, decreased problem solving, decreased safety awareness, and decreased memory, central origin, and decreased standing balance, decreased postural control, and decreased balance strategies.  Prior to hospitalization, patient could complete BADLs with modified independent , supervision required for U/LB bathing and shower transfers.  Patient will benefit from skilled intervention to decrease level of assist with basic self-care skills, increase independence with basic self-care skills, and increase level of independence with iADL prior to discharge home with care partner.  Anticipate patient will require intermittent supervision and follow up outpatient.  OT - End of Session Activity Tolerance: Decreased this session Endurance Deficit: Yes OT Assessment Rehab Potential (ACUTE ONLY): Good OT Patient demonstrates impairments in the following area(s): Balance;Cognition;Endurance;Motor;Safety;Pain OT Basic ADL's Functional Problem(s): Toileting;Dressing;Bathing;Grooming OT Advanced ADL's Functional Problem(s): Simple Meal Preparation OT Transfers Functional Problem(s): Tub/Shower;Toilet OT Additional Impairment(s): Fuctional Use of Upper Extremity OT Plan OT Intensity: Minimum of 1-2 x/day, 45 to 90 minutes OT Frequency: 5 out of 7 days OT Duration/Estimated Length of Stay: 5-7days OT Treatment/Interventions:  Balance/vestibular training;Disease mangement/prevention;Neuromuscular re-education;Self Care/advanced ADL retraining;Therapeutic Exercise;Wheelchair propulsion/positioning;UE/LE Strength taining/ROM;Skin care/wound managment;Pain management;DME/adaptive equipment instruction;Cognitive remediation/compensation;Community reintegration;Patient/family education;Splinting/orthotics;UE/LE Coordination activities;Discharge planning;Functional mobility training;Psychosocial support;Therapeutic Activities;Visual/perceptual remediation/compensation OT Self Feeding Anticipated Outcome(s): mod I OT Basic Self-Care Anticipated Outcome(s): mod I OT Toileting Anticipated Outcome(s): mod I OT Bathroom Transfers Anticipated Outcome(s): mod I OT Recommendation Recommendations for Other Services: Therapeutic Recreation consult Therapeutic Recreation Interventions: Pet therapy Patient destination: Home Follow Up Recommendations: Outpatient OT   OT Evaluation Precautions/Restrictions  Precautions Precautions: Fall Recall of Precautions/Restrictions: Intact Restrictions Weight Bearing Restrictions Per Provider Order: No General Additional Pertinent History: hypertension, type 2 diabetes mellitus, CKD stage III, COPD, GERD, rectal cancer undergoing laparoscopic low anterior resection 2014 followed by oncology services Dr. Lanny, depression, quit smoking 5 years ago, multiple prior strokes most recent 2024 Family/Caregiver Present: No Pain Pain Assessment Pain Scale: 0-10 Pain Score: 0-No pain Home Living/Prior Functioning Home Living Family/patient expects to be discharged to:: Private residence Living Arrangements: Alone Available Help at Discharge: Family, Available PRN/intermittently Type of Home: Apartment Home Access: Stairs to enter [employer] of Steps: 2 Entrance Stairs-Rails: Right Home Layout: One level Bathroom Shower/Tub: Engineer, Manufacturing Systems: Standard Bathroom  Accessibility: Yes Additional Comments: has an aide 3x/week 2 hours, Son comes by daily to check on Pt.  Lives With: Family IADL History Homemaking Responsibilities: Yes Meal Prep Responsibility: Secondary (simple meal prep) Laundry Responsibility: No Cleaning Responsibility: No Bill Paying/Finance Responsibility: No Shopping Responsibility: Secondary Child Care Responsibility: No Homemaking Comments: son assists with finances and takes Pt to grocery store x1/week;aide helps with medications; Pt prepares mircrowave meals mod I Current License: No Education: scientist, product/process development school - cosmetology license Occupation: Retired Leisure and Hobbies: being  outside, playing with her dog, bowling Prior Function Level of Independence: Needs assistance with ADLs, Needs assistance with homemaking, Requires assistive device for independence, Independent with transfers Bath: Supervision/set-up Meal Prep: Supervision/set-up Laundry: Total Light Housekeeping: Total Pet Care: Minimal  Able to Take Stairs?: Yes Driving: No Vocation: Retired Administrator, Sports Baseline Vision/History: 1 Wears glasses Ability to See in Adequate Light: 1 Impaired Patient Visual Report: No change from baseline Vision Assessment?: No apparent visual deficits Perception  Perception: Within Functional Limits Praxis Praxis: Impaired Praxis Impairment Details: Motor planning Cognition Cognition Overall Cognitive Status: History of cognitive impairments - at baseline (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, however Pt reporting worse memory, attention, EF skills, and awareness) Arousal/Alertness: Awake/alert Orientation Level: Person;Place;Situation Person: Oriented Place: Oriented Situation: Oriented Memory: Impaired Memory Impairment: Storage deficit;Retrieval deficit Attention: Selective Selective Attention: Impaired Awareness: Appears intact Problem Solving: Impaired Problem Solving Impairment: Verbal basic;Functional  basic Executive Function:  (slowed, however intact for BADLs) Safety/Judgment: Appears intact Brief Interview for Mental Status (BIMS) Repetition of Three Words (First Attempt): 3 Temporal Orientation: Year: Correct Temporal Orientation: Month: Accurate within 5 days Temporal Orientation: Day: Incorrect Recall: Sock: No, could not recall Recall: Blue: Yes, after cueing (a color) Recall: Bed: No, could not recall BIMS Summary Score: 9 Sensation Sensation Light Touch: Appears Intact Hot/Cold: Appears Intact Proprioception: Appears Intact Stereognosis: Not tested Coordination Gross Motor Movements are Fluid and Coordinated: No Fine Motor Movements are Fluid and Coordinated: No Coordination and Movement Description: mild instability in standing with decreased step length and L knee instability Finger Nose Finger Test: effortful and slowed on L 9 Hole Peg Test: R-36.35 sec L-44.09 sec Motor  Motor Motor: Other (comment);Hemiplegia Motor - Skilled Clinical Observations: Mild balance and L LE strength deficits  Trunk/Postural Assessment  Cervical Assessment Cervical Assessment: Exceptions to [facility] (mild forward head) Thoracic Assessment Thoracic Assessment: Exceptions to [facility] (mild rounded shoulders) Lumbar Assessment Lumbar Assessment: Exceptions to [facility] Postural Control Postural Control: Deficits on evaluation Righting Reactions: delayed Protective Responses: delayed  Balance Balance Balance Assessed: Yes Static Sitting Balance Static Sitting - Balance Support: Feet unsupported Static Sitting - Level of Assistance: 5: Stand by assistance Dynamic Sitting Balance Dynamic Sitting - Balance Support: During functional activity;Feet unsupported Dynamic Sitting - Level of Assistance: 5: Stand by assistance Static Standing Balance Static Standing - Balance Support: During functional activity;Right upper extremity supported Static Standing - Level of Assistance: 4: Min  assist Dynamic Standing Balance Dynamic Standing - Balance Support: Right upper extremity supported;During functional activity Dynamic Standing - Level of Assistance: 4: Min assist Extremity/Trunk Assessment RUE Assessment RUE Assessment: Within Functional Limits General Strength Comments: mild strength deficit 2/2 deconditioning, however 4/5 WFL LUE Assessment LUE Assessment: Exceptions to [employer] General Strength Comments: mild strength deficit with decreased shoulder flexion,3/5 overall  Care Tool Care Tool Self Care Eating   Eating Assist Level: Set up assist    Oral Care    Oral Care Assist Level: Contact Guard/Toucning assist    Bathing   Body parts bathed by patient: Right arm;Left arm;Chest;Abdomen;Front perineal area;Right upper leg;Left upper leg;Right lower leg;Left lower leg;Face     Assist Level: Moderate Assistance - Patient 50 - 74%    Upper Body Dressing(including orthotics)   What is the patient wearing?: Pull over shirt   Assist Level: Supervision/Verbal cueing    Lower Body Dressing (excluding footwear)   What is the patient wearing?: Underwear/pull up;Pants Assist for lower body dressing: Minimal Assistance - Patient > 75%    Putting  on/Taking off footwear   What is the patient wearing?: Socks Assist for footwear: Set up assist       Care Tool Toileting Toileting activity   Assist for toileting: Minimal Assistance - Patient > 75%     Care Tool Bed Mobility Roll left and right activity   Roll left and right assist level: Supervision/Verbal cueing    Sit to lying activity   Sit to lying assist level: Supervision/Verbal cueing    Lying to sitting on side of bed activity   Lying to sitting on side of bed assist level: the ability to move from lying on the back to sitting on the side of the bed with no back support.: Supervision/Verbal cueing     Care Tool Transfers Sit to stand transfer   Sit to stand assist level: Contact Guard/Touching assist     Chair/bed transfer   Chair/bed transfer assist level: Contact Guard/Touching assist     Toilet transfer   Assist Level: Contact Guard/Touching assist     Care Tool Cognition  Expression of Ideas and Wants Expression of Ideas and Wants: 3. Some difficulty - exhibits some difficulty with expressing needs and ideas (e.g, some words or finishing thoughts) or speech is not clear  Understanding Verbal and Non-Verbal Content Understanding Verbal and Non-Verbal Content: 4. Understands (complex and basic) - clear comprehension without cues or repetitions   Memory/Recall Ability Memory/Recall Ability : Current season;That he or she is in a hospital/hospital unit   Refer to Care Plan for Long Term Goals  SHORT TERM GOAL WEEK 1 OT Short Term Goal 1 (Week 1): STG=LTG d/t ELOS  Recommendations for other services: Therapeutic Recreation  Pet therapy   Skilled Therapeutic Intervention Skilled Therapeutic Interventions/Progress Updates:  1:1 OT evaluation and intervention initiated with skilled education provided on OT role, goals, and POC. Pt received sitting up in bed presenting to be in good spirits receptive to skilled OT session reporting 0/10 pain- OT offering intermittent rest breaks, repositioning, and therapeutic support to optimize participation in therapy session. Pt completed BADLs at levels listed below this session with deficits noted in functional cognition, coordination, and dynamic balance. Pt would benefit from continued OT services in IPR setting. Pt was left resting in bed with call bell in reach, bed alarm on, and all needs met.   ADL ADL Eating: Set up Where Assessed-Eating: Edge of bed Grooming: Contact guard Where Assessed-Grooming: Standing at sink Upper Body Bathing: Supervision/safety Where Assessed-Upper Body Bathing: Edge of bed Lower Body Bathing: Minimal assistance Where Assessed-Lower Body Bathing: Edge of bed Upper Body Dressing: Supervision/safety Where  Assessed-Upper Body Dressing: Edge of bed Lower Body Dressing: Contact guard;Minimal assistance Where Assessed-Lower Body Dressing: Edge of bed Toileting: Minimal assistance;Contact guard Where Assessed-Toileting: Teacher, Adult Education: Furniture Conservator/restorer Method: Proofreader: Engineer, Technical Sales: Not assessed Film/video Editor: Not assessed Mobility  Bed Mobility Bed Mobility: Rolling Right;Rolling Left;Supine to Sit;Sit to Supine Rolling Right: Supervision/verbal cueing Rolling Left: Supervision/Verbal cueing Supine to Sit: Supervision/Verbal cueing Sit to Supine: Supervision/Verbal cueing Transfers Sit to Stand: Contact Guard/Touching assist Stand to Sit: Contact Guard/Touching assist   Discharge Criteria: Patient will be discharged from OT if patient refuses treatment 3 consecutive times without medical reason, if treatment goals not met, if there is a change in medical status, if patient makes no progress towards goals or if patient is discharged from hospital.  The above assessment, treatment plan, treatment alternatives and goals were discussed and mutually agreed  upon: by patient  Katheryn SHAUNNA [place] 09/29/2023, 10:07 AM

## 2023-09-29 NOTE — Evaluation (Signed)
 Speech Language Pathology Assessment and Plan  Patient Details  Name: Erica [name] MRN: [id number] Date of Birth: 09-16-1955  SLP Diagnosis: Dysphagia;Cognitive Impairments;Other (comment) (mild neurogenic stutter)  Rehab Potential: Excellent ELOS: 7-10 days    Today's Date: 09/29/2023 SLP Individual Time: 1100-1200 SLP Individual Time Calculation (min): 60 min   Hospital Problem: Principal Problem:   Left thalamic infarction [facility])  Past Medical History:  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   GERD (gastroesophageal reflux disease)    with chronic cough   Headache(784.0)    "mild"   Hypertension    Loop Biotronik loop implant 12/16/2020 12/16/2020   Mental disorder    depression   Neuropathy    feet    Pain    arthritis pain - takes tramadol  as needed   Pulmonary fibrosis, unspecified (HCC)    Rectal cancer (HCC)    Rectal polyp    very little bleeding with bowel movements- no pain   Stroke (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   Suicide attempt [facility])    Past Surgical History:  Past Surgical History:  Procedure Laterality Date   ANAL RECTAL MANOMETRY N/A 07/13/2016   Procedure: ANO RECTAL MANOMETRY;  Surgeon: Joyce Nixon, MD;  Location: [facility] ENDOSCOPY;  Service: Endoscopy;  Laterality: N/A;   CESAREAN SECTION     EUS N/A 11/21/2012   Procedure: LOWER ENDOSCOPIC ULTRASOUND (EUS);  Surgeon: Evangeline Hilts, MD;  Location: [facility];  Service: Endoscopy;  Laterality: N/A;   FLEXIBLE SIGMOIDOSCOPY N/A 11/21/2012   Procedure: FLEXIBLE SIGMOIDOSCOPY;  Surgeon: Evangeline Hilts, MD;  Location: [facility] ENDOS[facility];  Service: Endoscopy;  Laterality: N/A;   FLEXIBLE SIGMOIDOSCOPY N/A 01/28/2013   Procedure: FLEXIBLE SIGMOIDOSCOPY;  Surgeon: Joyce Nixon, MD;  Location: [facility]OS[facility];  Service: Endoscopy;  Laterality: N/A;    LAPAROSCOPIC LOW ANTERIOR RESECTION N/A 01/29/2013   Procedure: LAPAROSCOPIC LOW ANTERIOR RESECTION, Rigid Proctoscopy;  Surgeon: Joyce Nixon, MD;  Location: [facility] ORS;  Service: General;  Laterality: N/A;   LAPAROSCOPIC SIGMOID COLECTOMY N/A 11/14/2012   Procedure: DIAGNOSTIC LAPAROSCOPY AND SIGMOIDMOIDOSCOPY ;  Surgeon: Enid Harry, MD;  Location: [facility] ORS;  Service: General;  Laterality: N/A;   RECTAL ULTRASOUND N/A 07/13/2016   Procedure: RECTAL ULTRASOUND;  Surgeon: Joyce Nixon, MD;  Location: [facility];  Service: Endoscopy;  Laterality: N/A;   TONSILLECTOMY     TONSILLECTOMY AND ADENOIDECTOMY      Assessment / Plan / Recommendation Clinical Impression HPI: Erica [name] is a 68 y.o. female with past medical history of hypertension, depression, diabetes, COPD, prior strokes, rectal cancer who was admitted to [facility] ED on 09/25/23 with left thalamic CVA after she developed right facial and tongue numbness and altered speech.  Patient was seen by neurology and aspirin  81 mg was added to Plavix  75 mg.  She had a MRI on 09/25/2023 that showed acute lacunar infarct of the left thalamus, multiple additional remote strokes and chronic moderate microvascular ischemic changes.  Zetia  was added to Crestor .  Patient was treated in CIR last year also after CVA.  She has been seen by speech therapy for impaired speech and cognition.  Patient was seen by physical therapy with deficits in the min assist/mod assist level with CIR recommended.  Clinical Impression:  Bedside Swallow Evaluation: A bedside swallow evaluation was completed  to assess for s/sx of oropharyngeal dysphagia. Oral mechanism exam revealed reduced sensation on R lingual surface, though functional for mastication. POs administered included thin liquids, puree and solids. Patient with timely mastication and adequate oral clearance. Patient with immediate throat clears after thin liquids and purees indicative of possible  bolus misdirection. Patient reports need for effortful swallow due to globus sensation. Recommend continuation of regular/thin diet at this time with use of standardized precautions including sitting upright during PO and taking small bites/sips at a slow rate. Recommend intermittent supervision during mealtimes and completion of MBS for objective look at oropharyngeal anatomy and physiology as scheduling allows.  Cognitive-Lingustic: Patient was evaluated via the Cognistat to assess cognitive-linguistic functioning. Patient scored WFL on all subtests despite mild deficits in calculations and executive functioning and severe deficits in memory and visual spatial skills. Patient with functional expressive and receptive language, though with occasional instances of neurogenic stuttering at the conversational level. Patient is aware of deficits in memory, problem solving, and motor speech and agreeable to target in ST.  Pt would benefit from skilled ST services to maximize communication, cognition and dysphagia in order to maximize functional independence at d/c. Anticipate patient will require supervision at d/c and f/u SLP services.    Skilled Therapeutic Interventions          Patient evaluated using a standardized cognitive linguistic assessment and bedside swallow evaluation to assess current cognitive, communicative and swallowing function. See above for details.    SLP Assessment  Patient will need skilled Speech Lanaguage Pathology Services during CIR admission    Recommendations  SLP Diet Recommendations: Age appropriate regular solids;Thin Liquid Administration via: Cup Medication Administration: Whole meds with liquid Supervision: Patient able to self feed;Intermittent supervision to cue for compensatory strategies Compensations: Minimize environmental distractions;Slow rate;Small sips/bites;Follow solids with liquid;Effortful swallow Postural Changes and/or Swallow Maneuvers: Seated upright  90 degrees Oral Care Recommendations: Oral care BID Patient destination: Home Follow up Recommendations: Home Health SLP;Outpatient SLP Equipment Recommended: None recommended by SLP    SLP Frequency 3 to 5 out of 7 days   SLP Duration  SLP Intensity  SLP Treatment/Interventions 7-10 days  Minumum of 1-2 x/day, 30 to 90 minutes  Cognitive remediation/compensation;Dysphagia/aspiration precaution training;Internal/external aids;Speech/Language facilitation;Therapeutic Activities;Functional tasks;Multimodal communication approach;Patient/family education    Pain Denies  SLP Evaluation Cognition Overall Cognitive Status: Impaired/Different from baseline Arousal/Alertness: Awake/alert Orientation Level: Oriented X4 Year: 2025 Month: April Day of Week: Correct Attention: Selective Sustained Attention: Appears intact Selective Attention: Appears intact Memory: Impaired Memory Impairment: Storage deficit;Decreased short term memory;Retrieval deficit Decreased Short Term Memory: Verbal basic;Functional basic Awareness: Appears intact Problem Solving: Impaired Problem Solving Impairment: Verbal basic;Functional basic Executive Function:  (slowed, however intact for BADLs) Safety/Judgment: Appears intact  Comprehension Auditory Comprehension Overall Auditory Comprehension: Appears within functional limits for tasks assessed Expression Expression Primary Mode of Expression: Verbal Verbal Expression Overall Verbal Expression: Appears within functional limits for tasks assessed Written Expression Dominant Hand: Right Oral Motor Oral Motor/Sensory Function Overall Oral Motor/Sensory Function: Mild impairment Facial ROM: Within Functional Limits Facial Symmetry: Within Functional Limits Facial Strength: Within Functional Limits Facial Sensation: Within Functional Limits Lingual ROM: Within Functional Limits Lingual Symmetry: Within Functional Limits Lingual Strength: Within  Functional Limits Lingual Sensation: Reduced Velum: Within Functional Limits Mandible: Within Functional Limits Motor Speech Overall Motor Speech: Impaired Respiration: Within functional limits Phonation: Low vocal intensity Resonance: Within functional limits Articulation: Impaired Level of Impairment: Conversation Intelligibility: Intelligible Motor Planning: Impaired Level of Impairment: Press photographer  Errors: Aware Interfering Components: Premorbid status Effective Techniques: Slow rate;Increased vocal intensity;Over-articulate  Care Tool Care Tool Cognition Ability to hear (with hearing aid or hearing appliances if normally used Ability to hear (with hearing aid or hearing appliances if normally used): 0. Adequate - no difficulty in normal conservation, social interaction, listening to TV   Expression of Ideas and Wants Expression of Ideas and Wants: 3. Some difficulty - exhibits some difficulty with expressing needs and ideas (e.g, some words or finishing thoughts) or speech is not clear   Understanding Verbal and Non-Verbal Content Understanding Verbal and Non-Verbal Content: 4. Understands (complex and basic) - clear comprehension without cues or repetitions  Memory/Recall Ability Memory/Recall Ability : Current season;That he or she is in a hospital/hospital unit   Bedside Swallowing Assessment General Diet Prior to this Study: Regular;Thin liquids (Level 0) Respiratory Status: Room air Behavior/Cognition: Alert;Cooperative Oral Cavity - Dentition: Adequate natural dentition Self-Feeding Abilities: Able to feed self Patient Positioning: Upright in bed Baseline Vocal Quality: Low vocal intensity Volitional Cough: Strong Volitional Swallow: Able to elicit  Ice Chips Ice chips: Not tested Thin Liquid Thin Liquid: Impaired Presentation: Self Fed;Cup Pharyngeal  Phase Impairments: Throat Clearing - Immediate;Multiple swallows Nectar Thick Nectar Thick  Liquid: Not tested Honey Thick Honey Thick Liquid: Not tested Puree Puree: Impaired Presentation: Self Fed;Spoon Pharyngeal Phase Impairments: Throat Clearing - Immediate Solid Solid: Within functional limits Presentation: Self Fed BSE Assessment Risk for Aspiration Impact on safety and function: Moderate aspiration risk Other Related Risk Factors: History of GERD;Previous CVA;Cognitive impairment  Short Term Goals: Week 1: SLP Short Term Goal 1 (Week 1): STG = LTG due to ELOS  Refer to Care Plan for Long Term Goals  Recommendations for other services: None   Discharge Criteria: Patient will be discharged from SLP if patient refuses treatment 3 consecutive times without medical reason, if treatment goals not met, if there is a change in medical status, if patient makes no progress towards goals or if patient is discharged from hospital.  The above assessment, treatment plan, treatment alternatives and goals were discussed and mutually agreed upon: by patient  Kamaiya Antilla M.A., CCC-SLP 09/29/2023, 12:01 PM

## 2023-09-29 NOTE — Progress Notes (Signed)
 PROGRESS NOTE   Subjective/Complaints:  Pt does not remember me from prior CIR admit or office visits.  Discussed location of new CVA.  Pt states her main new sx is some facial weakness and "getting words out"  ROS- neg CP, SOB, N/V/D  Objective:   No results found. Recent Labs    09/28/23 1713 09/29/23 0533  WBC 4.9 4.0  HGB 11.8* 11.6*  HCT 33.3* 32.1*  PLT 125* 114*   Recent Labs    09/27/23 0903 09/28/23 1713 09/29/23 0533  NA 140  --  139  K 4.7  --  4.6  CL 108  --  112*  CO2 21*  --  19*  GLUCOSE 187*  --  127*  BUN 26*  --  35*  CREATININE 1.67* 1.91* 1.86*  CALCIUM  8.8*  --  8.8*    Intake/Output Summary (Last 24 hours) at 09/29/2023 0837 Last data filed at 09/29/2023 0800 Gross per 24 hour  Intake 120 ml  Output --  Net 120 ml        Physical Exam: Vital Signs Blood pressure 124/71, pulse (!) 58, temperature 98.2 F (36.8 C), resp. rate 16, height 5\' 2"  (1.575 m), weight 73.5 kg, SpO2 100%.  General: No acute distress Mood and affect are appropriate Heart: Regular rate and rhythm no rubs murmurs or extra sounds Lungs: Clear to auscultation, breathing unlabored, no rales or wheezes Abdomen: Positive bowel sounds, soft nontender to palpation, nondistended Extremities: No clubbing, cyanosis, or edema Skin: No evidence of breakdown, no evidence of rash Neurologic: Oriented x 3, able to name ring, watch , stethoscope  motor strength is 4/5 in bilateral deltoid, bicep, tricep, grip, hip flexor, knee extensors, ankle dorsiflexor and plantar flexor Sensory exam normal sensation to light touch  in bilateral upper  extremities Cerebellar exam Mild dysmetria Left FNF but ok on right side Musculoskeletal: Full range of motion in all 4 extremities. No joint swelling    Assessment/Plan: 1. Functional deficits which require 3+ hours per day of interdisciplinary therapy in a comprehensive inpatient  rehab setting. Physiatrist is providing close team supervision and 24 hour management of active medical problems listed below. Physiatrist and rehab team continue to assess barriers to discharge/monitor patient progress toward functional and medical goals  Care Tool:  Bathing              Bathing assist       Upper Body Dressing/Undressing Upper body dressing        Upper body assist      Lower Body Dressing/Undressing Lower body dressing            Lower body assist       Toileting Toileting    Toileting assist       Transfers Chair/bed transfer  Transfers assist           Locomotion Ambulation   Ambulation assist              Walk 10 feet activity   Assist           Walk 50 feet activity   Assist           Walk  150 feet activity   Assist           Walk 10 feet on uneven surface  activity   Assist           Wheelchair     Assist               Wheelchair 50 feet with 2 turns activity    Assist            Wheelchair 150 feet activity     Assist          Blood pressure 124/71, pulse (!) 58, temperature 98.2 F (36.8 C), resp. rate 16, height 5\' 2"  (1.575 m), weight 73.5 kg, SpO2 100%.  Medical Problem List and Plan: 1. 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  Loop recorder placed 2022             -patient may shower             -ELOS/Goals: 5-7 days, SPV PT/OT/SLP             - Stable for IRF admission   2.  Antithrombotics: -DVT/anticoagulation:  Pharmaceutical: Heparin              -antiplatelet therapy: Aspirin  81 mg daily and Plavix  75 mg day x 3 weeks then Plavix  alone 3. Pain Management: Neurontin  100 mg daily as needed, Tylenol  as needed 4. Mood/Behavior/Sleep: Effexor  150 mg daily, Zoloft  50 mg daily              -antipsychotic agents: N/A 5. Neuropsych/cognition: This patient is capable of making decisions on her own behalf. 6. Skin/Wound Care: Routine skin checks 7. Fluids/Electrolytes/Nutrition: Routine in and outs with follow-up chemistries 8.  CKD stage III.  Creatinine baseline 1.27-2.0.  Follow-up chemistries 9.  Diabetes mellitus.  Hemoglobin A1c 5.6.  Currently SSI.  Prior to admission patient on Lantus  insulin  30 units daily as well as Farxiga 10 mg daily and resume as needed  CBG (last 3)  Recent Labs    09/28/23 1241 09/28/23 1720 09/29/23 0819  GLUCAP 173* 123* 227*  Resume Lantus  10U at bedtime , titrate upwards  10.  COPD/history of tobacco use as well as Idiopathic Pulmonary Fibrosis.  Continue inhalers as advised as well as Pirfenidone  3 times daily.  F/u with Dr Bertrum Brodie , pulmonary  11.  Hyperlipidemia.  Crestor /Zetia  12.  Permissive hypertension.  Patient on Norvasc  10 mg daily, hydralazine  25 mg every 8 hours prior to admission.  Resume as needed Vitals:   09/28/23 2016 09/29/23 0509  BP: (!) 153/80 124/71  Pulse: 69 (!) 58  Resp: 16 16  Temp: 98.6 F (37 C) 98.2 F (36.8 C)  SpO2: 100% 100%   Some lability , monitor on current dose  13.  History of rectal cancer.  Follow-up outpatient Dr. Maryalice Smaller 14.  GERD.  Protonix    Erica Hockey Angiulli, PA-C 09/28/2023    LOS: 1 days A FACE TO FACE EVALUATION WAS PERFORMED  Erica [name] 09/29/2023, 8:37 AM

## 2023-09-29 NOTE — Discharge Summary (Signed)
 Physician Discharge Summary  Patient ID: Erica [name] MRN: [id number] DOB/AGE: Mar 21, 1956 68 y.o.  Admit date: 09/28/2023 Discharge date: 10/10/2023  Discharge Diagnoses:  Principal Problem:   Left thalamic infarction [facility]) DVT prophylaxis CKD stage III Diabetes mellitus COPD with tobacco abuse Hyperlipidemia Permissive hypertension History of rectal cancer GERD Mood stabilization  Discharged Condition: Stable  Significant Diagnostic Studies: DG Swallowing Func-Speech Pathology Result Date: 10/02/2023 Table formatting from the original result was not included. Modified Barium Swallow Study Patient Details Name: Erica [name] MRN: [id number] Date of Birth: November 16, 1955 Today's Date: 10/02/2023 HPI/PMH: HPI: [name] is 68 yo F with PMHx significant for recurrent stroke with residual left-sided weakness and ILR in place, COPD/IPF, insulin -dependent T2DM, CKD stage IIIb, HTN, HLD, depression/anxiety who presented to the ED for evaluation of right facial/tongue numbness and worsened slurred speech from baseline. CT head without contrast negative for acute finding. MRI revealed acute lacunar infarct in the left thalamus. Clinical Impression: Patient presents with functional oropharyngeal swallow with thin liquid PO, though unable to truly assess oropharyngeal swallow with puree consistencies. Oral phase is remarkable for occasional repetitive tongue motion, though adequate clearance of oral cavity and no labial escape. Pharyngeal swallow initiated mildly delayed to the level of the epiglottis vs pyriforms. Pharyngeal phase is remarkable for trace base of tongue residuals, and with no penetration/aspiration present. Patient with a suspected CP bar, though no other anatomical abnormalities observed. During POs of puree, patient stating she "cannot swallow" and "her throat is sore." No true lingual movement observed in attempts to swallow, therefore suspect oral holding is psychological rather than a  true anatomical or physiological deficit. Per patients nurse, patient consumed medications and breakfast with no difficulty this AM. Due to patients throat pain, she reports preference of D2 diet/thin. Recommend full supervision at this time to ensure oral clearance. DIGEST Swallow Severity Rating*  Safety: 0  Efficiency: 0  Overall Pharyngeal Swallow Severity: 0 1: mild; 2: moderate; 3: severe; 4: profound *The Dynamic Imaging Grade of Swallowing Toxicity is standardized for the head and neck cancer population, however, demonstrates promising clinical applications across populations to standardize the clinical rating of pharyngeal swallow safety and severity. Factors that may increase risk of adverse event in presence of aspiration Erica [name] & Erica [name] 2021): Factors that may increase risk of adverse event in presence of aspiration Erica [name] & Erica [name] 2021): Reduced cognitive function Recommendations/Plan: Swallowing Evaluation Recommendations Swallowing Evaluation Recommendations Recommendations: PO diet PO Diet Recommendation: Dysphagia 2 (Finely chopped); Thin liquids (Level 0) Liquid Administration via: Cup; Straw Medication Administration: Whole meds with liquid Supervision: Patient able to self-feed; Full supervision/cueing for swallowing strategies Swallowing strategies  : Minimize environmental distractions; Slow rate; Small bites/sips Postural changes: Position pt fully upright for meals Oral care recommendations: Oral care BID (2x/day) Treatment Plan Treatment Plan Treatment recommendations: Therapy as outlined in treatment plan below Follow-up recommendations: Home health SLP Recommendations Comment: n/a Functional status assessment: Patient has had a recent decline in their functional status and demonstrates the ability to make significant improvements in function in a reasonable and predictable amount of time. Treatment frequency: Min 2x/week Treatment duration: 2 weeks Interventions: Trials of upgraded  texture/liquids; Patient/family education; Diet toleration management by SLP Recommendations Recommendations for follow up therapy are one component of a multi-disciplinary discharge planning process, led by the attending physician.  Recommendations may be updated based on patient status, additional functional criteria and insurance authorization. Assessment: Orofacial Exam: Orofacial Exam Oral Cavity: Oral Hygiene: WFL Oral Cavity -  Dentition: Adequate natural dentition Orofacial Anatomy: WFL Oral Motor/Sensory Function: -- (reports reduced lingual and oral sensation) Anatomy: No data recorded Boluses Administered: Boluses Administered Boluses Administered: Thin liquids (Level 0); Puree  Oral Impairment Domain: Oral Impairment Domain Lip Closure: No labial escape Tongue control during bolus hold: Cohesive bolus between tongue to palatal seal Bolus preparation/mastication: -- (none observed) Bolus transport/lingual motion: Repetitive/disorganized tongue motion Oral residue: Minimal to no clearance (oral holding of puree) Location of oral residue : Palate; Tongue Initiation of pharyngeal swallow : Valleculae; Posterior laryngeal surface of the epiglottis  Pharyngeal Impairment Domain: Pharyngeal Impairment Domain Soft palate elevation: No bolus between soft palate (SP)/pharyngeal wall (PW) Laryngeal elevation: Complete superior movement of thyroid  cartilage with complete approximation of arytenoids to epiglottic petiole Anterior hyoid excursion: Complete anterior movement Epiglottic movement: Complete inversion Laryngeal vestibule closure: Complete, no air/contrast in laryngeal vestibule Pharyngeal stripping wave : Present - complete Pharyngoesophageal segment opening: Minimal distention/minimal duration, marked obstruction of flow (CP bar) Tongue base retraction: Trace column of contrast or air between tongue base and PPW Pharyngeal residue: Complete pharyngeal clearance  Esophageal Impairment Domain: No data  recorded Pill: No data recorded Penetration/Aspiration Scale Score: Penetration/Aspiration Scale Score 1.  Material does not enter airway: Thin liquids (Level 0) Compensatory Strategies: Compensatory Strategies Compensatory strategies: No   General Information: No data recorded Diet Prior to this Study: Regular; Thin liquids (Level 0)   No data recorded  Respiratory Status: WFL   Supplemental O2: None (Room air)   No data recorded Behavior/Cognition: Alert; Requires cueing Self-Feeding Abilities: Able to self-feed Baseline vocal quality/speech: Hypophonia/low volume No data recorded No data recorded Exam Limitations: Poor participation; Poor bolus acceptance Goal Planning: Prognosis for improved oropharyngeal function: Fair Barriers to Reach Goals: Cognitive deficits; Motivation Barriers/Prognosis Comment: limited participation Patient/Family Stated Goal: none stated Consulted and agree with results and recommendations: Patient Pain: No data recorded End of Session: Start Time:No data recorded Stop Time: No data recorded Time Calculation:No data recorded Charges: No data recorded SLP visit diagnosis: SLP Visit Diagnosis: Cognitive communication deficit (R41.841); Dysphagia, unspecified (R13.10) Past Medical History: Past Medical History: Diagnosis Date  Arthritis   especially in shoulders  Asthma   COPD (chronic obstructive pulmonary disease) (HCC)   Depression   Diabetes mellitus   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  GERD (gastroesophageal reflux disease)   with chronic cough  Headache(784.0)   "mild"  Hypertension   Loop Biotronik loop implant 12/16/2020 12/16/2020  Mental disorder   depression  Neuropathy   feet   Pain   arthritis pain - takes tramadol  as needed  Pulmonary fibrosis, unspecified (HCC)   Rectal cancer (HCC)   Rectal polyp   very little bleeding with bowel movements- no pain  Stroke (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  Suicide attempt [facility])  Past Surgical History: Past Surgical History: Procedure Laterality Date  ANAL  RECTAL MANOMETRY N/A 07/13/2016  Procedure: ANO RECTAL MANOMETRY;  Surgeon: Joyce Nixon, MD;  Location: [facility];  Service: Endoscopy;  Laterality: N/A;  CESAREAN SECTION    EUS N/A 11/21/2012  Procedure: LOWER ENDOSCOPIC ULTRASOUND (EUS);  Surgeon: Evangeline Hilts, MD;  Location: [facility];  Service: Endoscopy;  Laterality: N/A;  FLEXIBLE SIGMOIDOSCOPY N/A 11/21/2012  Procedure: FLEXIBLE SIGMOIDOSCOPY;  Surgeon: Evangeline Hilts, MD;  Location: [facility];  Service: Endoscopy;  Laterality: N/A;  FLEXIBLE SIGMOIDOSCOPY N/A 01/28/2013  Procedure: FLEXIBLE SIGMOIDOSCOPY;  Surgeon: Joyce Nixon, MD;  Location: [facility];  Service: Endoscopy;  Laterality: N/A;  LAPAROSCOPIC LOW ANTERIOR RESECTION N/A 01/29/2013  Procedure: LAPAROSCOPIC  LOW ANTERIOR RESECTION, Rigid Proctoscopy;  Surgeon: Joyce Nixon, MD;  Location: [facility];  Service: General;  Laterality: N/A;  LAPAROSCOPIC SIGMOID COLECTOMY N/A 11/14/2012  Procedure: DIAGNOSTIC LAPAROSCOPY AND SIGMOIDMOIDOSCOPY ;  Surgeon: Enid Harry, MD;  Location: [facility];  Service: General;  Laterality: N/A;  RECTAL ULTRASOUND N/A 07/13/2016  Procedure: RECTAL ULTRASOUND;  Surgeon: Joyce Nixon, MD;  Location: [facility];  Service: Endoscopy;  Laterality: N/A;  TONSILLECTOMY    TONSILLECTOMY AND ADENOIDECTOMY   Cassidi F Sockwell 10/02/2023, 9:59 AM  ECHOCARDIOGRAM COMPLETE Result Date: 09/26/2023    ECHOCARDIOGRAM REPORT   Patient Name:   Erica [name] Date of Exam: 09/26/2023 Medical Rec #:  [id number]    Height:       62.0 in Accession #:    [id number]   Weight:       159.0 lb Date of Birth:  1956-06-06    BSA:          1.734 m Patient Age:    68 years     BP:           140/87 mmHg Patient Gender: F            HR:           67 bpm. Exam Location:  Inpatient Procedure: 2D Echo, Cardiac Doppler, Color Doppler and Intracardiac            Opacification Agent (Both Spectral and Color Flow Doppler were            utilized during procedure). Indications:    Stroke  History:         Patient has prior history of Echocardiogram examinations, most                 recent 03/05/2023. COPD; Risk Factors:Hypertension, Diabetes,                 Dyslipidemia and Former Smoker.  Sonographer:    Reta Cassis Referring Phys: [id number] VISHAL R PATEL  Sonographer Comments: Technically difficult study due to poor echo windows and patient is obese. Image acquisition challenging due to COPD. IMPRESSIONS  1. Left ventricular ejection fraction, by estimation, is 55 to 60%. The left ventricle has normal function. The left ventricle demonstrates regional wall motion abnormalities (see scoring diagram/findings for description). There is mild left ventricular  hypertrophy. Left ventricular diastolic parameters are consistent with Grade I diastolic dysfunction (impaired relaxation).  2. Right ventricular systolic function is normal. The right ventricular size is normal.  3. Left atrial size was mildly dilated.  4. The mitral valve is normal in structure. No evidence of mitral valve regurgitation. No evidence of mitral stenosis.  5. The aortic valve is calcified. There is moderate calcification of the aortic valve. There is moderate thickening of the aortic valve. Aortic valve regurgitation is mild. Aortic valve sclerosis is present, with no evidence of aortic valve stenosis. Aortic valve mean gradient measures 4.0 mmHg. Aortic valve Vmax measures 1.36 m/s.  6. The inferior vena cava is normal in size with greater than 50% respiratory variability, suggesting right atrial pressure of 3 mmHg. FINDINGS  Left Ventricle: Left ventricular ejection fraction, by estimation, is 55 to 60%. The left ventricle has normal function. The left ventricle demonstrates regional wall motion abnormalities. Definity  contrast agent was given IV to delineate the left ventricular endocardial borders. The left ventricular internal cavity size was normal in size. There is mild left ventricular hypertrophy. Left ventricular diastolic  parameters are consistent with Grade I  diastolic dysfunction (impaired relaxation).  LV Wall Scoring: The basal inferior segment is akinetic. The basal inferoseptal segment is hypokinetic. Right Ventricle: The right ventricular size is normal. No increase in right ventricular wall thickness. Right ventricular systolic function is normal. Left Atrium: Left atrial size was mildly dilated. Right Atrium: Right atrial size was normal in size. Pericardium: There is no evidence of pericardial effusion. Mitral Valve: The mitral valve is normal in structure. No evidence of mitral valve regurgitation. No evidence of mitral valve stenosis. Tricuspid Valve: The tricuspid valve is normal in structure. Tricuspid valve regurgitation is not demonstrated. No evidence of tricuspid stenosis. Aortic Valve: The aortic valve is calcified. There is moderate calcification of the aortic valve. There is moderate thickening of the aortic valve. Aortic valve regurgitation is mild. Aortic valve sclerosis is present, with no evidence of aortic valve stenosis. Aortic valve mean gradient measures 4.0 mmHg. Aortic valve peak gradient measures 7.4 mmHg. Aortic valve area, by VTI measures 1.89 cm. Pulmonic Valve: The pulmonic valve was normal in structure. Pulmonic valve regurgitation is not visualized. No evidence of pulmonic stenosis. Aorta: The aortic root is normal in size and structure. Venous: The inferior vena cava is normal in size with greater than 50% respiratory variability, suggesting right atrial pressure of 3 mmHg. IAS/Shunts: No atrial level shunt detected by color flow Doppler.  LEFT VENTRICLE PLAX 2D LVIDd:         3.90 cm   Diastology LVIDs:         2.50 cm   LV e' medial:    3.26 cm/s LV PW:         1.20 cm   LV E/e' medial:  16.2 LV IVS:        1.40 cm   LV e' lateral:   5.22 cm/s LVOT diam:     2.00 cm   LV E/e' lateral: 10.1 LV SV:         55 LV SV Index:   32 LVOT Area:     3.14 cm  RIGHT VENTRICLE             IVC RV Basal  diam:  3.60 cm     IVC diam: 1.40 cm RV S prime:     10.60 cm/s TAPSE (M-mode): 2.9 cm LEFT ATRIUM             Index        RIGHT ATRIUM           Index LA diam:        3.80 cm 2.19 cm/m   RA Area:     16.10 cm LA Vol (A2C):   69.5 ml 40.08 ml/m  RA Volume:   39.40 ml  22.72 ml/m LA Vol (A4C):   44.8 ml 25.83 ml/m LA Biplane Vol: 55.8 ml 32.18 ml/m  AORTIC VALVE AV Area (Vmax):    1.82 cm AV Area (Vmean):   1.81 cm AV Area (VTI):     1.89 cm AV Vmax:           136.00 cm/s AV Vmean:          90.500 cm/s AV VTI:            0.289 m AV Peak Grad:      7.4 mmHg AV Mean Grad:      4.0 mmHg LVOT Vmax:         79.00 cm/s LVOT Vmean:        52.200 cm/s LVOT VTI:  0.174 m LVOT/AV VTI ratio: 0.60  AORTA Ao Root diam: 3.20 cm Ao Asc diam:  3.10 cm MITRAL VALVE MV Area (PHT): 3.17 cm     SHUNTS MV Decel Time: 239 msec     Systemic VTI:  0.17 m MV E velocity: 52.80 cm/s   Systemic Diam: 2.00 cm MV A velocity: 102.00 cm/s MV E/A ratio:  0.52 Dorothye Gathers MD Electronically signed by Dorothye Gathers MD Signature Date/Time: 09/26/2023/2:46:16 PM    Final    MR BRAIN WO CONTRAST Result Date: 09/25/2023 CLINICAL DATA:  Neuro deficit, concern for stroke. Left-sided symptoms, left facial numbness, slurred speech. EXAM: MRI HEAD WITHOUT CONTRAST TECHNIQUE: Multiplanar, multiecho pulse sequences of the brain and surrounding structures were obtained without intravenous contrast. COMPARISON:  Same day CT head and CTA head and neck. MRI head 03/18/2023. FINDINGS: Brain: Punctate focus of restricted diffusion involving the left thalamus partially extending into the cerebral peduncle compatible with acute lacunar infarct. No additional areas of acute infarct pre shaded. Focal encephalomalacia in the right cerebral hemisphere compatible with remote infarcts. Mild associated susceptibility suggestive of prior hemorrhage. Additional small focus of susceptibility in the left middle frontal gyrus with encephalomalacia and gliosis  compatible with remote infarct and remote hemorrhage. Additional remote infarcts in the right frontal lobe, bilateral occipital lobes, small remote infarcts in the left cerebellum, and remote lacunar infarcts in the bilateral basal ganglia and bilateral thalami. Scattered and confluent FLAIR signal abnormality in the periventricular and subcortical white matter suggestive of moderate chronic microvascular ischemic changes. No midline shift. The basilar cisterns are patent. No extra-axial fluid collections. Ventricles: Prominence of the lateral ventricles suggestive of underlying parenchymal volume loss. Vascular: Skull base flow voids are visualized. Skull and upper cervical spine: No focal abnormality. Sinuses/Orbits: Orbits are symmetric. Paranasal sinuses are clear. Other: Mastoid air cells are clear. IMPRESSION: Acute lacunar infarct in the left thalamus. Multiple additional remote infarcts as above, similar to 2024. Moderate chronic microvascular ischemic changes and mild parenchymal volume loss. These results were communicated to [facility] at 5:48 pm on 09/25/2023 by text page via the [facility] messaging system. Electronically Signed   By: Denny Flack M.D.   On: 09/25/2023 17:49   CT ANGIO HEAD NECK W WO CM (CODE STROKE) Result Date: 09/25/2023 CLINICAL DATA:  Provided history: Neuro deficit, acute, stroke suspected. Additional history obtained from electronic MEDICAL RECORD NUMBERFacial numbness, slurred speech. EXAM: CT ANGIOGRAPHY HEAD AND NECK WITH AND WITHOUT CONTRAST TECHNIQUE: Multidetector CT imaging of the head and neck was performed using the standard protocol during bolus administration of intravenous contrast. Multiplanar CT image reconstructions and MIPs were obtained to evaluate the vascular anatomy. Carotid stenosis measurements (when applicable) are obtained utilizing NASCET criteria, using the distal internal carotid diameter as the denominator. RADIATION DOSE REDUCTION: This exam was performed  according to the departmental dose-optimization program which includes automated exposure control, adjustment of the mA and/or kV according to patient size and/or use of iterative reconstruction technique. CONTRAST:  75mL OMNIPAQUE  IOHEXOL  300 MG/ML  SOLN COMPARISON:  Non-contrast head CT performed earlier today 09/25/2023. CT angiogram head/neck 03/18/2023. FINDINGS: CTA NECK FINDINGS Aortic arch: Standard aortic branching. The visualized thoracic aorta is normal in caliber. Streak/beam hardening artifact arising from a dense contrast bolus partially obscures the left subclavian artery. Within this limitation, there is no appreciable hemodynamically significant innominate or proximal subclavian artery stenosis. Right carotid system: CCA and ICA patent within the neck without stenosis or significant atherosclerotic disease. No evidence of dissection.  Left carotid system: Beam hardening artifact arising from a dense contrast bolus partially obscures the proximal CCA. Within this limitation, the CCA and ICA are patent within the neck without stenosis or significant atherosclerotic disease. No evidence of dissection. Vertebral arteries: Codominant and patent within the neck without stenosis or significant atherosclerotic disease. No evidence of dissection. Skeleton: Spondylosis at the cervical and visualized upper thoracic levels. No acute fracture or aggressive osseous lesion. Other neck: No neck mass or cervical lymphadenopathy. Upper chest: No consolidation within the imaged lung apices. Centrilobular paraseptal emphysema. Incompletely assessed fibrotic lung disease. Review of the MIP images confirms the above findings CTA HEAD FINDINGS Anterior circulation: The intracranial internal carotid arteries are patent. Atherosclerotic plaque within both vessels with no more than mild stenosis. The M1 middle cerebral arteries are patent. No M2 proximal branch occlusion or high-grade proximal stenosis. The anterior cerebral  arteries are patent. No intracranial aneurysm is identified. Posterior circulation: The intracranial vertebral arteries are patent. The basilar artery is patent. 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 The posterior cerebral arteries are patent. Posterior communicating arteries are diminutive or absent, bilaterally. Venous sinuses: Within the limitations of contrast timing, no convincing thrombus. Anatomic variants: As described. Review of the MIP images confirms the above findings No emergent large vessel occlusion identified. These results were communicated to [facility] at 12:10 pmon 4/14/2025by text page via the [facility] messaging system. IMPRESSION: CTA neck: 1. Artifact arising from a dense contrast bolus partially obscures the proximal left common carotid artery. Within this limitation, the common carotid, internal carotid and vertebral arteries are patent within the neck without stenosis or significant atherosclerotic disease. No evidence of dissection. 2. Incompletely assessed fibrotic lung disease. 3. Emphysema (ICD10-J43.9). CTA head: 1. No proximal intracranial large vessel occlusion or high-grade proximal arterial stenosis identified. 2. Atherosclerotic plaque within the intracranial internal carotid arteries with no more than mild stenosis. Electronically Signed   By: Bascom Lily D.O.   On: 09/25/2023 12:10   CT HEAD CODE STROKE WO CONTRAST Result Date: 09/25/2023 CLINICAL DATA:  Code stroke. EXAM: CT HEAD WITHOUT CONTRAST TECHNIQUE: Contiguous axial images were obtained from the base of the skull through the vertex without intravenous contrast. RADIATION DOSE REDUCTION: This exam was performed according to the departmental dose-optimization program which includes automated exposure control, adjustment of the mA and/or kV according to patient size and/or use of iterative reconstruction technique. COMPARISON:  06/09/2023 FINDINGS: Brain: Scattered areas of gray-white  differentiation loss in the bilateral occipital parietal cortex and in the anterior and superior frontal lobes. Small chronic infarcts in the bilateral cerebellum. Chronic lacunar infarcts in the bilateral thalamus and caudate. Ischemic gliosis is present in the bilateral cerebral white matter. No evidence of acute infarct, hemorrhage, hydrocephalus, or mass. Vascular: No hyperdense vessel or unexpected calcification. Skull: Normal. Negative for fracture or focal lesion. Sinuses/Orbits: No acute finding. IMPRESSION: 1. No acute finding. 2. Numerous small cortical, cerebellar, and lacunar infarcts as seen on head CT 06/09/2023. Electronically Signed   By: Ronnette Coke M.D.   On: 09/25/2023 11:34   CUP PACEART REMOTE DEVICE CHECK Result Date: 09/14/2023 ILR summary report received. Battery status OK. Normal device function. No new symptom, tachy, brady, or pause episodes. No new AF episodes.  Recordings list: 1 Asystole on 09/05/2023, no EGMs, sent to traige    Monthly summary reports and ROV/PRN ML, CVRS   Labs:  Basic Metabolic Panel: Recent Labs  Lab 10/06/23 1221  NA 140  K 4.6  CL 112*  CO2 21*  GLUCOSE 83  BUN 37*  CREATININE 2.36*  CALCIUM  8.7*    CBC: No results for input(s): "WBC", "NEUTROABS", "HGB", "HCT", "MCV", "PLT" in the last 168 hours.   CBG: Recent Labs  Lab 10/08/23 1642 10/09/23 0606 10/09/23 1128 10/09/23 1644 10/09/23 2057  GLUCAP 209* 121* 88 187* 241*   Family history.  Father with hypertension CVA diabetes mellitus.  Mother with CAD hyperlipidemia.  Denies any esophageal cancer  Brief HPI:   Erica [name] is a 68 y.o. right-handed female with history significant for hypertension type 2 diabetes mellitus CKD stage III COPD GERD rectal cancer undergoing laparoscopic low anterior resection 2014 followed by oncology services, depression, quit smoking 5 years ago as well as multiple prior strokes most recent 2024 including by thalamic, etiology most likely  small vessel disease with a loop recorder placed and received CIR 03/08/2023 - 03/15/2023 discharge ambulating 200 feet supervision rolling walker as well as CVA 2022 receiving CIR 11/06/2020 - 11/21/2020 and currently maintained on Plavix .  Per chart review patient lives alone.  1 level apartment.  She has an aide 3 times a week 2 hours a day.  She has a son who checks on her routinely.  Used a Youth worker for mobility.  Presented 09/25/2023 with acute onset of right facial/tongue numbness and slurred speech.  CT/MRI showed acute lacunar infarct in the left thalamus.  Multiple additional remote infarcts.  CTA of the head showed no large vessel occlusion CTA of the neck showed no dissection.  Admission chemistries unremarkable except potassium 6.8 chloride 112 BUN 55 creatinine 2.00 hemoglobin A1c 6.9.  Echocardiogram with ejection fraction of 55 to 60% grade 1 diastolic dysfunction.  Neurology follow-up placed on low-dose aspirin  and Plavix  for CVA prophylaxis x 3 weeks then Plavix  alone.  Subcutaneous heparin  added for DVT prophylaxis.  Her potassium was corrected with latest potassium 4.7 creatinine 1.67.  Therapy evaluations completed due to patient decreased functional mobility was admitted for a comprehensive rehab program.   Hospital Course: Erica [name] was admitted to rehab 09/28/2023 for inpatient therapies to consist of PT, ST and OT at least three hours five days a week. Past admission physiatrist, therapy team and rehab RN have worked together to provide customized collaborative inpatient rehab.  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  Loop recorder placed 2022.  She remains on low-dose aspirin  and Plavix  x 3 weeks then Plavix  alone with follow-up per neurology services.  Subcutaneous heparin  added for DVT prophylaxis no bleeding episodes.  History of CKD  stage III creatinine baseline 1.27-2.00 and monitor of chemistries.  Blood sugars controlled hemoglobin A1c 6.9 she had been on sliding scale insulin  added prior to admission on Lantus  as well as Farxiga which was resumed as needed with diabetic teaching and would need outpatient follow-up.  History of COPD history of tobacco abuse remained on inhalers as well as pirfenidone  3 times daily.  Oxygen saturations monitored every shift.  History of hyperlipidemia with Crestor  and Zetia  as directed.  Permissive hypertension patient on Norvasc  as well as hydralazine  25 mg every 8 hours prior to admission these were resumed as needed monitor closely with increased mobility and would need outpatient follow-up.  History of rectal cancer follow-up outpatient Dr.Feng.  Protonix  ongoing for GERD.  Mood stabilization with use of Zoloft  as well the Effexor  or an emotional support  provided she was attending full therapies.   Blood pressures were monitored on TID basis and remained controlled and monitored  Diabetes has been monitored with ac/hs CBG checks and SSI was use prn for tighter BS control.    Rehab course: During patient's stay in rehab weekly team conferences were held to monitor patient's progress, set goals and discuss barriers to discharge. At admission, patient required minimal assist 20 feet rolling walker minimal assist at pivot transfers  Physical exam.  Blood pressure 133/63 pulse 66 temperature 98 respiration 17 oxygen saturation is 98% room air Constitutional.  No acute distress HEENT Head.  Normocephalic and atraumatic Eyes.  Pupils round and reactive to light no discharge without nystagmus Neck.  Supple nontender no JVD without thyromegaly Cardiac regular rate and rhythm without extra sounds or murmur heard Abdomen.  Soft nontender positive bowel sounds without rebound Respiratory effort normal no respiratory distress without wheeze Skin.  Clean dry and intact Neurologic.  Alert oriented x  3.  No dysarthria.  No apparent deficits Manual muscle testing Right upper extremity 4/5 SA 4/5 ADF 4/5 EE/5 WC/5 FF/5 FA Left upper extremity 3/5 SA 3/5 DF 3/5 EE 3/5 WC 3/5 FF 3/5 FA Right lower extremity 4/5 HF 4/5 KE 4/5 DF 4/5 EHL 4/5 PF Left lower extremity 3/5 HF 3/5 KE 3/5 DF 3/5 EHL 3/5 PF  He/She  has had improvement in activity tolerance, balance, postural control as well as ability to compensate for deficits. He/She has had improvement in functional use RUE/LUE  and RLE/LLE as well as improvement in awareness.  Performed supine to sit supervision.  No cueing for technique.  Perform sit to stand and stand pivot transfers contact-guard.  Ambulates 125 feet x 2 using straight point cane with contact-guard.  Transition to edge of bed supervision.  Able to complete 3/3 toileting tasks with close supervision.  Patient completed majority of shower in seated position for energy conservation.  She was able to don briefs and pants contact-guard.  Completed grooming hygiene task in standing position at sink.  Required supervision assist identify problem solution and safety scenario photos.  Full family teaching completed recommendations of supervision assist at home and patient discharged to home       Disposition:  Discharge disposition: 01-Home or Self Care        Diet: Diabetic diet  Special Instructions: No driving smoking or alcohol   Medications at discharge. 1.  Tylenol  as needed 2.  Aspirin  81 mg p.o. daily through 10/16/2023 and stop 3.  Plavix  75 mg p.o. daily 4.  Zetia  10 mg p.o. daily 5.  Symbicort  160-4.5 mcg inhaler 2 puffs into the lungs twice daily 6.  Neurontin  100 mg daily as needed pain 7.  Protonix  40 mg p.o. daily 8.Pirfenidone  801 mg 3 times daily with meals 9.  Crestor  40 mg p.o. daily 10.  Zoloft  50 mg p.o. daily 11.  Effexor  XR 150 mg p.o. daily 12.  Albuterol  inhaler 2 puffs every 4 hours as needed 13.  Melatonin 3 mg nightly 14.  Voltaren  gel 1% twice  daily as needed 15.  Naphazaline-glycerin  1 to 2 drops 3 times daily 17.  Norvasc  10 mg daily 18.  Farxiga 10 mg daily 19.  Lantus  insulin  10 units daily 20.  Zyrtec  10 mg daily 21.  Hydralazine  25 mg 3 times daily   30-35 minutes were spent completing discharge summary and discharge planning Discharge Instructions     Ambulatory referral to Neurology   Complete by:  As directed    An appointment is requested in approximately: 4 weeks left thalamic infarction   Ambulatory referral to Physical Medicine Rehab   Complete by: As directed    Moderate complexity follow-up 1 to 2 weeks left thalamic infarction        Follow-up Information     Kirsteins, Cecilia Coe, MD Follow up.   Specialty: Physical Medicine and Rehabilitation Why: Office to call for appointment Contact information: [address] [place] [address] [phone]         Sonja Ansonia, MD Follow up.   Specialties: Hematology, Oncology Why: Call for appointment Contact information: [address] [place] [address] [phone]                 Signed: Everlyn Hockey Rodrick Payson 10/10/2023, 5:00 AM

## 2023-09-29 NOTE — Plan of Care (Signed)
  Problem: RH BOWEL ELIMINATION Goal: RH STG MANAGE BOWEL WITH ASSISTANCE Description: STG Manage Bowel with toileting Assistance. Outcome: Progressing Goal: RH STG MANAGE BOWEL W/MEDICATION W/ASSISTANCE Description: STG Manage Bowel with Medication with mod I  Assistance. Outcome: Progressing   Problem: RH SAFETY Goal: RH STG ADHERE TO SAFETY PRECAUTIONS W/ASSISTANCE/DEVICE Description: STG Adhere to Safety Precautions With cues Assistance/Device. Outcome: Progressing   Problem: RH PAIN MANAGEMENT Goal: RH STG PAIN MANAGED AT OR BELOW PT'S PAIN GOAL Description: < 4 with prns Outcome: Progressing

## 2023-09-29 NOTE — IPOC Note (Signed)
 Overall Plan of Care [facility]) Patient Details Name: Erica [name] MRN: [id number] DOB: [birth year]  Admitting Diagnosis: Left thalamic infarction [facility])  Hospital Problems: Principal Problem:   Left thalamic infarction [facility])     Functional Problem List: Nursing Pain, Bowel, Endurance, Medication Management, Safety  PT Balance, Endurance, Motor, Perception, Safety  OT Balance, Cognition, Endurance, Motor, Safety, Pain  SLP    TR         Basic ADL's: OT Toileting, Dressing, Bathing, Grooming     Advanced  ADL's: OT Simple Meal Preparation     Transfers: PT Bed Mobility, Bed to Chair, Car, Lobbyist, Technical brewer: PT Ambulation, Stairs     Additional Impairments: OT Fuctional Use of Upper Extremity  SLP Swallowing, Social Cognition   Problem Solving, Memory  TR      Anticipated Outcomes Item Anticipated Outcome  Self Feeding mod I  Swallowing  supervision   Basic self-care  mod I  Toileting  mod I   Bathroom Transfers mod I  Bowel/Bladder  manage bowel w mod I assist  Transfers  IND  Locomotion  IND  Communication  supervision  Cognition  minA  Pain  Pain < 4 with prns  Safety/Judgment  manage safety w cues   Therapy Plan: PT Intensity: Minimum of 1-2 x/day ,45 to 90 minutes PT Frequency: 5 out of 7 days PT Duration Estimated Length of Stay: 5 - 7 days OT Intensity: Minimum of 1-2 x/day, 45 to 90 minutes OT Frequency: 5 out of 7 days OT Duration/Estimated Length of Stay: 5-7days SLP Intensity: Minumum of 1-2 x/day, 30 to 90 minutes SLP Frequency: 3 to 5 out of 7 days SLP Duration/Estimated Length of Stay: 7-10 days   Team Interventions: Nursing Interventions Medication Management, Bowel Management, Disease Management/Prevention, Patient/Family Education  PT interventions Ambulation/gait training, Firefighter, Fish farm manager, Neuromuscular re-education, Psychosocial support, Museum/gallery curator,  UE/LE Strength taining/ROM, Warden/ranger, Discharge planning, Pain management, Skin care/wound management, Therapeutic Activities, UE/LE Coordination activities, Cognitive remediation/compensation, Disease management/prevention, Functional mobility training, Patient/family education, Splinting/orthotics, Therapeutic Exercise  OT Interventions Balance/vestibular training, Disease mangement/prevention, Neuromuscular re-education, Self Care/advanced ADL retraining, Therapeutic Exercise, Wheelchair propulsion/positioning, UE/LE Strength taining/ROM, Skin care/wound managment, Pain management, DME/adaptive equipment instruction, Cognitive remediation/compensation, Community reintegration, Equities trader education, Splinting/orthotics, UE/LE Coordination activities, Discharge planning, Functional mobility training, Psychosocial support, Therapeutic Activities, Visual/perceptual remediation/compensation  SLP Interventions Cognitive remediation/compensation, Dysphagia/aspiration precaution training, Internal/external aids, Speech/Language facilitation, Therapeutic Activities, Functional tasks, Multimodal communication approach, Patient/family education  TR Interventions    SW/CM Interventions Discharge Planning, Psychosocial Support, Patient/Family Education   Barriers to Discharge MD  Medical stability  Nursing Decreased caregiver support 1 level/level entry;has an aide 3x/week 2 hours, Son comes by daily to check on Pt.  PT Decreased caregiver support, Home environment Best boy, Insurance for SNF coverage    OT      SLP      SW Decreased caregiver support, Lack of/limited family support, Community education officer for SNF coverage     Team Discharge Planning: Destination: PT-Home ,OT- Home , SLP-Home Projected Follow-up: PT-Home health PT, 24 hour supervision/assistance, OT-  Outpatient OT, SLP-Home Health SLP, Outpatient SLP Projected Equipment Needs: PT-To be determined, OT- To be determinedTo  be determined, SLP-None recommended by SLP Equipment Details: PT- , OT-  Patient/family involved in discharge planning: PT- Patient,  OT-Patient, SLP-Patient  MD ELOS: 7d Medical Rehab Prognosis:  Good Assessment: The patient has been admitted for CIR therapies with the diagnosis of Left thalamic  infarction. The team will be addressing functional mobility, strength, stamina, balance, safety, adaptive techniques and equipment, self-care, bowel and bladder mgt, patient and caregiver education, BP management . Goals have been set at Mod I/Sup. Anticipated discharge destination is Home .        See Team Conference Notes for weekly updates to the plan of care

## 2023-09-29 NOTE — Progress Notes (Signed)
 Inpatient Rehabilitation  Patient information reviewed and entered into eRehab system by [facility]. Karen Kays., CCC/SLP, PPS Coordinator.  Information including medical coding, functional ability and quality indicators will be reviewed and updated thro

## 2023-09-29 NOTE — H&P (Signed)
 Expand All Collapse All      Physical Medicine and Rehabilitation Admission H&P        Chief Complaint  Patient presents with   Code Stroke  : HPI: Erica [name] is a 68 year old right-handed female with history of hypertension, type 2 diabetes mellitus, CKD stage III, COPD, GERD, rectal cancer undergoing laparoscopic low anterior resection 2014 followed by oncology services Dr. 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  Per chart review patient lives alone.  1 level apartment.  She has an aide 3 times a week 2 hours a day.  She has a son in the area that checks on her routinely.  She uses a Youth worker for mobility.  Presented 09/25/2023 with acute onset of right facial/tongue numbness and slurred speech.  CT/MRI showed acute lacunar infarct in the left thalamus.  Multiple additional remote infarcts.  CTA of the head showed no large vessel occlusion.CTA of the neck showed no dissection.  Admission chemistries unremarkable except potassium 6.8 chloride 112 BUN 55 creatinine 2.00, hemoglobin A1c 6.9.  Echocardiogram with ejection fraction of 55 to 60% grade 1 diastolic dysfunction.  Neurology follow-up patient currently on low-dose aspirin  and Plavix  for CVA prophylaxis x 3 weeks then Plavix  alone.  Subcu heparin  added for DVT prophylaxis.  Her potassium was corrected with latest potassium 4.7 and creatinine remained stable at 1.67.  Therapy evaluations completed due to patient's decreased functional mobility was admitted for a comprehensive rehab program   Review of Systems  Constitutional:  Negative for chills and fever.  HENT:  Positive for ear pain. Negative for  hearing loss.   Eyes:  Negative for blurred vision and double vision.  Respiratory:  Negative for cough and wheezing.        Shortness of breath with heavy exertion  Cardiovascular:  Positive for leg swelling. Negative for chest pain and palpitations.  Gastrointestinal:  Positive for constipation. Negative for heartburn, nausea and vomiting.       GERD  Genitourinary:  Negative for dysuria, flank pain and hematuria.  Musculoskeletal:  Positive for myalgias.  Neurological:  Positive for speech change, weakness and headaches.  Psychiatric/Behavioral:  Positive for depression. The patient has insomnia.   All other systems reviewed and are negative.       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   GERD (gastroesophageal reflux disease)      with chronic cough   Headache(784.0)      "mild"   Hypertension     Loop Biotronik loop implant 12/16/2020 12/16/2020   Mental disorder      depression   Neuropathy      feet    Pain      arthritis pain - takes tramadol  as needed   Pulmonary fibrosis, unspecified (HCC)     Rectal cancer (HCC)     Rectal polyp      very little bleeding with bowel movements- no pain   Stroke (HCC)  11/03/2020   Suicide attempt [facility])               Past Surgical History:  Procedure Laterality Date   ANAL RECTAL MANOMETRY N/A 07/13/2016    Procedure: ANO RECTAL MANOMETRY;  Surgeon: Joyce Nixon, MD;  Location: [facility];  Service: Endoscopy;  Laterality: N/A;   CESAREAN SECTION       EUS N/A 11/21/2012    Procedure: LOWER ENDOSCOPIC ULTRASOUND (EUS);  Surgeon: Evangeline Hilts, MD;  Location: [facility];  Service: Endoscopy;  Laterality: N/A;   FLEXIBLE SIGMOIDOSCOPY N/A 11/21/2012    Procedure: FLEXIBLE SIGMOIDOSCOPY;  Surgeon: Evangeline Hilts, MD;  Location: [facility];  Service:  Endoscopy;  Laterality: N/A;   FLEXIBLE SIGMOIDOSCOPY N/A 01/28/2013    Procedure: FLEXIBLE SIGMOIDOSCOPY;  Surgeon: Joyce Nixon, MD;  Location: [facility];  Service: Endoscopy;  Laterality: N/A;   LAPAROSCOPIC LOW ANTERIOR RESECTION N/A 01/29/2013    Procedure: LAPAROSCOPIC LOW ANTERIOR RESECTION, Rigid Proctoscopy;  Surgeon: Joyce Nixon, MD;  Location: [facility];  Service: General;  Laterality: N/A;   LAPAROSCOPIC SIGMOID COLECTOMY N/A 11/14/2012    Procedure: DIAGNOSTIC LAPAROSCOPY AND SIGMOIDMOIDOSCOPY ;  Surgeon: Enid Harry, MD;  Location: [facility];  Service: General;  Laterality: N/A;   RECTAL ULTRASOUND N/A 07/13/2016    Procedure: RECTAL ULTRASOUND;  Surgeon: Joyce Nixon, MD;  Location: [facility];  Service: Endoscopy;  Laterality: N/A;   TONSILLECTOMY       TONSILLECTOMY AND ADENOIDECTOMY                 Family History  Problem Relation Age of Onset   Hypertension Father     Stroke Father     Diabetes Father     Heart disease Mother     Hypertension Mother     Hyperlipidemia Mother     Hypertension Sister     Hypertension Brother     Hypertension Sister     Hypertension Brother     Cancer Other 51        colon cancer    Cancer Maternal Aunt          cancer, unknown type    Cancer Maternal Uncle          bone cancer    Cancer Paternal Aunt          lung cancer   Cancer Paternal Uncle          lung cancer   Cancer Maternal Uncle          colon cancer and prostate cancer    Cancer Maternal Uncle          prostate cancer         Social History:  reports that she quit smoking about 5 years ago. Her smoking use included cigarettes. She started smoking about 25 years ago. She has a 10 pack-year smoking history. She has never used smokeless tobacco. She reports that she does not currently use alcohol . She reports that she does not currently use drugs after having used the following drugs: "Crack" cocaine. Allergies:  Allergies       Allergies  Allergen  Reactions   Penicillins Hives, Itching and Swelling      Tongue swelling            Medications Prior to Admission  Medication Sig Dispense Refill   albuterol  (VENTOLIN  HFA) 108 (90 Base) MCG/ACT inhaler Inhale 2 puffs into the lungs every 4 (four) hours as needed for wheezing. (Patient taking differently: Inhale  2 puffs into the lungs 2 (two) times daily as needed for wheezing.) 1 g 5   amLODipine  (NORVASC ) 10 MG tablet Take 1 tablet (10 mg total) by mouth daily. 30 tablet 0   cetirizine  (ZYRTEC ) 10 MG tablet Take 1 tablet (10 mg total) by mouth daily. 30 tablet 0   clopidogrel  (PLAVIX ) 75 MG tablet Take 75 mg by mouth daily.       diclofenac  Sodium (VOLTAREN ) 1 % GEL Apply 1 Application topically 2 (two) times daily as needed (pain).       DULoxetine (CYMBALTA) 30 MG capsule Take 30 mg by mouth daily.       FARXIGA 10 MG TABS tablet Take 10 mg by mouth daily.       gabapentin  (NEURONTIN ) 100 MG capsule Take 1 capsule (100 mg total) by mouth 2 (two) times daily. Patient is taking as needed (Patient taking differently: Take 100 mg by mouth daily as needed (pain).) 60 capsule 0   hydrALAZINE  (APRESOLINE ) 25 MG tablet SMARTSIG:1.5 Tablet(s) By Mouth Every 8 Hours       LANTUS  SOLOSTAR 100 UNIT/ML Solostar Pen Inject 30 Units into the skin daily.       naphazoline-glycerin  (CLEAR EYES REDNESS) 0.012-0.25 % SOLN Place 1-2 drops into both eyes 3 (three) times daily with meals. (Patient taking differently: Place 2 drops into both eyes daily as needed for eye irritation.)       pantoprazole  (PROTONIX ) 40 MG tablet Take 40 mg by mouth daily.       Pirfenidone  801 MG TABS TAKE 1 TABLET (801 MG) BY MOUTH WITH BREAKFAST, WITH LUNCH, AND WITH EVENING MEAL. (Patient taking differently: Take 1 tablet by mouth in the morning and at bedtime.) 270 tablet 0   rosuvastatin  (CRESTOR ) 40 MG tablet Take 1 tablet (40 mg total) by mouth daily. 30 tablet 0   sertraline  (ZOLOFT ) 50 MG tablet Take 1 tablet (50 mg total)  by mouth daily. 30 tablet 0   triamcinolone cream (KENALOG) 0.1 % Apply 1 Application topically daily.       venlafaxine  XR (EFFEXOR -XR) 150 MG 24 hr capsule Take 1 capsule (150 mg total) by mouth daily. 30 capsule 0   SYMBICORT  160-4.5 MCG/ACT inhaler INHALE 2 PUFFS INTO THE LUNGS TWICE DAILY (Patient not taking: Reported on 09/25/2023) 10.2 g 5              Home: Home Living Family/patient expects to be discharged to:: Private residence Living Arrangements: Alone Available Help at Discharge: Family, Available PRN/intermittently Type of Home: Apartment Home Access: Level entry Home Layout: One level Bathroom Shower/Tub: Engineer, manufacturing systems: Standard Bathroom Accessibility: Yes Home Equipment: Cane - single point, Shower seat, Agricultural consultant (2 wheels) Additional Comments: has an aide 3x/week 2 hours, Son comes by daily to check on Pt.  Lives With: Family   Functional History: Prior Function Prior Level of Function : Independent/Modified Independent Mobility Comments: uses cane versus walker depending on the day ADLs Comments: sister and son assist with IADL's, she prepares microwave meals   Functional Status:  Mobility: Bed Mobility Overal bed mobility: Needs Assistance Bed Mobility: Supine to Sit, Sit to Supine Supine to sit: Min assist, HOB elevated Sit to supine: Min assist General bed mobility comments: min A in/out of bed Transfers Overall transfer level: Needs assistance Equipment used: Rolling walker (2 wheels) Transfers: Sit to/from Stand, Bed to chair/wheelchair/BSC Sit to Stand: Min assist Bed to/from chair/wheelchair/BSC transfer type:: Step pivot Step pivot transfers: Min assist  General transfer comment: min A for STS, left buckled a little at times with pt using RW Ambulation/Gait Ambulation/Gait assistance: Min assist Gait Distance (Feet): 20 Feet (20 feet x 2) Assistive device: Rolling walker (2 wheels) Gait Pattern/deviations: Step-to  pattern, Step-through pattern, Decreased stride length, Trunk flexed, Decreased dorsiflexion - left, Knee flexed in stance - left General Gait Details: slow pace, increased L knee flexion in stance with distance, reported feeling light headed, though BP stable from supine to sitting; reported headache that started with ambulation. Only able to ambulate to bathroom and back. Needed assist for cleaning and pulling Depends down and back up. Gait velocity interpretation: <1.31 ft/sec, indicative of household ambulator   ADL: ADL Overall ADL's : Needs assistance/impaired Eating/Feeding: Modified independent, Sitting Grooming: Set up, Sitting Upper Body Bathing: Set up, Sitting Lower Body Bathing: Minimal assistance, Sitting/lateral leans Upper Body Dressing : Set up, Sitting Lower Body Dressing: Minimal assistance, Sit to/from stand Toilet Transfer: Minimal assistance, Stand-pivot, Rolling walker (2 wheels), BSC/3in1 Toileting- Clothing Manipulation and Hygiene: Minimal assistance, Sit to/from stand General ADL Comments: Pt min A for sit to stand using RW and transfer to [facility]. Pt min A for LB dressing/bathing, instructed on use of sock aide, increased time to use.   Cognition: Cognition Overall Cognitive Status: Impaired/Different from baseline Arousal/Alertness: Awake/alert Orientation Level: Oriented X4 Year: 2025 Month: April Day of Week: Correct Attention: Sustained Sustained Attention: Impaired Sustained Attention Impairment: Verbal basic, Functional basic Memory: Impaired Memory Impairment: Storage deficit, Retrieval deficit Awareness: Appears intact Problem Solving: Impaired Problem Solving Impairment: Verbal basic, Functional basic Safety/Judgment: Appears intact Cognition Arousal: Alert Behavior During Therapy: WFL for tasks assessed/performed Overall Cognitive Status: Impaired/Different from baseline   Physical Exam: Blood pressure 133/63, pulse 66, temperature 98 F (36.7  C), temperature source Oral, resp. rate 17, height 5\' 2"  (1.575 m), weight 72.1 kg, SpO2 98%.  Constitutional: No apparent distress. Appropriate appearance for age.  HENT: No JVD. Neck Supple. Trachea midline. Atraumatic, normocephalic. Eyes: PERRLA. EOMI. Visual fields grossly intact.  Cardiovascular: RRR, no murmurs/rub/gallops. No Edema. Peripheral pulses 2+  Respiratory: CTAB. No rales, rhonchi, or wheezing. On RA.  Abdomen: + bowel sounds, normoactive. No distention or tenderness.  Skin: C/D/I. No apparent lesions. MSK:      No apparent deformity.       Neurologic exam:  Cognition: AAO to person, place, time and event.  Language: Fluent, No substitutions or neoglisms. No dysarthria.   Memory: No apparent deficits  Insight: Good  insight into current condition.  Mood: Pleasant affect, appropriate mood.  Sensation: To light touch reduced over R V2 on face only Reflexes: + LUE hoffman's, - babinski CN: + R V2 deficit, + R hearing deficit Coordination: L sided ataxia on FTN, HTS   Spasticity: MAS 0 in all extremities.       Strength:                RUE: 4/5 SA, 4/5 EF, 4/5 EE, 4/5 WE, 4/5 FF, 4/5 FA                LUE:  3/5 SA, 3/5 EF, 3/5 EE, 3/5 WE, 3/5 FF, 3/5 FA                RLE: 4/5 HF, 4/5 KE, 4/5  DF, 4/5  EHL, 4/5  PF                 LLE:  3/5 HF, 3/5 KE, 3/5  DF, 3/5  EHL, 3/5  PF         Lab Results Last 48 Hours        Results for orders placed or performed during the hospital encounter of 09/25/23 (from the past 48 hours)  CBG monitoring, ED     Status: Abnormal    Collection Time: 09/26/23 11:52 AM  Result Value Ref Range    Glucose-Capillary 123 (H) 70 - 99 mg/dL      Comment: Glucose reference range applies only to samples taken after fasting for at least 8 hours.  Glucose, capillary     Status: Abnormal    Collection Time: 09/26/23  4:33 PM  Result Value Ref Range    Glucose-Capillary 127 (H) 70 - 99 mg/dL      Comment: Glucose reference range applies  only to samples taken after fasting for at least 8 hours.  Glucose, capillary     Status: Abnormal    Collection Time: 09/26/23  7:43 PM  Result Value Ref Range    Glucose-Capillary 206 (H) 70 - 99 mg/dL      Comment: Glucose reference range applies only to samples taken after fasting for at least 8 hours.  Glucose, capillary     Status: Abnormal    Collection Time: 09/27/23  7:49 AM  Result Value Ref Range    Glucose-Capillary 203 (H) 70 - 99 mg/dL      Comment: Glucose reference range applies only to samples taken after fasting for at least 8 hours.  Platelet inhibition p2y12 (not at [facility])     Status: Abnormal    Collection Time: 09/27/23  9:03 AM  Result Value Ref Range    Platelet Function  P2Y12 161 (L) 182 - 335 PRU      Comment: (NOTE) The literature has shown a direct correlation of PRU values over 230 with higher risks of thrombotic events. Lower PRU values are associated with platelet inhibition. Performed at [facility], [address] [place], [place] [address]    CBC     Status: Abnormal    Collection Time: 09/27/23  9:03 AM  Result Value Ref Range    WBC 4.8 4.0 - 10.5 K/uL    RBC 3.87 3.87 - 5.11 MIL/uL    Hemoglobin 12.1 12.0 - 15.0 g/dL    HCT 21.3 (L) 08.6 - 46.0 %    MCV 87.3 80.0 - 100.0 fL    MCH 31.3 26.0 - 34.0 pg    MCHC 35.8 30.0 - 36.0 g/dL    RDW 57.8 (H) 46.9 - 15.5 %    Platelets 127 (L) 150 - 400 K/uL      Comment: REPEATED TO VERIFY    nRBC 0.0 0.0 - 0.2 %      Comment: Performed at [facility], [address] [place], [place] [address]  Basic metabolic panel with GFR     Status: Abnormal    Collection Time: 09/27/23  9:03 AM  Result Value Ref Range    Sodium 140 135 - 145 mmol/L    Potassium 4.7 3.5 - 5.1 mmol/L    Chloride 108 98 - 111 mmol/L    CO2 21 (L) 22 - 32 mmol/L    Glucose, Bld 187 (H) 70 - 99 mg/dL      Comment: Glucose reference range applies only to samples taken after fasting for at least 8 hours.    BUN 26  (H) 8 - 23 mg/dL    Creatinine, Ser  1.67 (H) 0.44 - 1.00 mg/dL    Calcium  8.8 (L) 8.9 - 10.3 mg/dL    GFR, Estimated 33 (L) >60 mL/min      Comment: (NOTE) Calculated using the CKD-EPI Creatinine Equation (2021)      Anion gap 11 5 - 15      Comment: Performed at [facility], [address], [place], [place] [address]  Glucose, capillary     Status: Abnormal    Collection Time: 09/27/23 12:29 PM  Result Value Ref Range    Glucose-Capillary 52 (L) 70 - 99 mg/dL      Comment: Glucose reference range applies only to samples taken after fasting for at least 8 hours.  Glucose, capillary     Status: Abnormal    Collection Time: 09/27/23  4:26 PM  Result Value Ref Range    Glucose-Capillary 189 (H) 70 - 99 mg/dL      Comment: Glucose reference range applies only to samples taken after fasting for at least 8 hours.  Glucose, capillary     Status: Abnormal    Collection Time: 09/27/23  8:17 PM  Result Value Ref Range    Glucose-Capillary 282 (H) 70 - 99 mg/dL      Comment: Glucose reference range applies only to samples taken after fasting for at least 8 hours.  Glucose, capillary     Status: Abnormal    Collection Time: 09/28/23  8:37 AM  Result Value Ref Range    Glucose-Capillary 216 (H) 70 - 99 mg/dL      Comment: Glucose reference range applies only to samples taken after fasting for at least 8 hours.       Imaging Results (Last 48 hours)  ECHOCARDIOGRAM COMPLETE Result Date: 09/26/2023    ECHOCARDIOGRAM REPORT   Patient Name:   Erica [name] Date of Exam: 09/26/2023 Medical Rec #:  [id number]    Height:       62.0 in Accession #:    [id number]   Weight:       159.0 lb Date of Birth:  February 03, 1956    BSA:          1.734 m Patient Age:    68 years     BP:           140/87 mmHg Patient Gender: F            HR:           67 bpm. Exam Location:  Inpatient Procedure: 2D Echo, Cardiac Doppler, Color Doppler and Intracardiac            Opacification Agent (Both Spectral and Color Flow  Doppler were            utilized during procedure). Indications:    Stroke  History:        Patient has prior history of Echocardiogram examinations, most                 recent 03/05/2023. COPD; Risk Factors:Hypertension, Diabetes,                 Dyslipidemia and Former Smoker.  Sonographer:    Reta Cassis Referring Phys: [id number] VISHAL R PATEL  Sonographer Comments: Technically difficult study due to poor echo windows and patient is obese. Image acquisition challenging due to COPD. IMPRESSIONS  1. Left ventricular ejection fraction, by estimation, is 55 to 60%. The left ventricle has normal function. The left ventricle demonstrates regional wall motion abnormalities (see scoring diagram/findings  for description). There is mild left ventricular  hypertrophy. Left ventricular diastolic parameters are consistent with Grade I diastolic dysfunction (impaired relaxation).  2. Right ventricular systolic function is normal. The right ventricular size is normal.  3. Left atrial size was mildly dilated.  4. The mitral valve is normal in structure. No evidence of mitral valve regurgitation. No evidence of mitral stenosis.  5. The aortic valve is calcified. There is moderate calcification of the aortic valve. There is moderate thickening of the aortic valve. Aortic valve regurgitation is mild. Aortic valve sclerosis is present, with no evidence of aortic valve stenosis. Aortic valve mean gradient measures 4.0 mmHg. Aortic valve Vmax measures 1.36 m/s.  6. The inferior vena cava is normal in size with greater than 50% respiratory variability, suggesting right atrial pressure of 3 mmHg. FINDINGS  Left Ventricle: Left ventricular ejection fraction, by estimation, is 55 to 60%. The left ventricle has normal function. The left ventricle demonstrates regional wall motion abnormalities. Definity  contrast agent was given IV to delineate the left ventricular endocardial borders. The left ventricular internal cavity size was normal in  size. There is mild left ventricular hypertrophy. Left ventricular diastolic parameters are consistent with Grade I diastolic dysfunction (impaired relaxation).  LV Wall Scoring: The basal inferior segment is akinetic. The basal inferoseptal segment is hypokinetic. Right Ventricle: The right ventricular size is normal. No increase in right ventricular wall thickness. Right ventricular systolic function is normal. Left Atrium: Left atrial size was mildly dilated. Right Atrium: Right atrial size was normal in size. Pericardium: There is no evidence of pericardial effusion. Mitral Valve: The mitral valve is normal in structure. No evidence of mitral valve regurgitation. No evidence of mitral valve stenosis. Tricuspid Valve: The tricuspid valve is normal in structure. Tricuspid valve regurgitation is not demonstrated. No evidence of tricuspid stenosis. Aortic Valve: The aortic valve is calcified. There is moderate calcification of the aortic valve. There is moderate thickening of the aortic valve. Aortic valve regurgitation is mild. Aortic valve sclerosis is present, with no evidence of aortic valve stenosis. Aortic valve mean gradient measures 4.0 mmHg. Aortic valve peak gradient measures 7.4 mmHg. Aortic valve area, by VTI measures 1.89 cm. Pulmonic Valve: The pulmonic valve was normal in structure. Pulmonic valve regurgitation is not visualized. No evidence of pulmonic stenosis. Aorta: The aortic root is normal in size and structure. Venous: The inferior vena cava is normal in size with greater than 50% respiratory variability, suggesting right atrial pressure of 3 mmHg. IAS/Shunts: No atrial level shunt detected by color flow Doppler.  LEFT VENTRICLE PLAX 2D LVIDd:         3.90 cm   Diastology LVIDs:         2.50 cm   LV e' medial:    3.26 cm/s LV PW:         1.20 cm   LV E/e' medial:  16.2 LV IVS:        1.40 cm   LV e' lateral:   5.22 cm/s LVOT diam:     2.00 cm   LV E/e' lateral: 10.1 LV SV:         55 LV SV  Index:   32 LVOT Area:     3.14 cm  RIGHT VENTRICLE             IVC RV Basal diam:  3.60 cm     IVC diam: 1.40 cm RV S prime:     10.60 cm/s TAPSE (M-mode): 2.9 cm  LEFT ATRIUM             Index        RIGHT ATRIUM           Index LA diam:        3.80 cm 2.19 cm/m   RA Area:     16.10 cm LA Vol (A2C):   69.5 ml 40.08 ml/m  RA Volume:   39.40 ml  22.72 ml/m LA Vol (A4C):   44.8 ml 25.83 ml/m LA Biplane Vol: 55.8 ml 32.18 ml/m  AORTIC VALVE AV Area (Vmax):    1.82 cm AV Area (Vmean):   1.81 cm AV Area (VTI):     1.89 cm AV Vmax:           136.00 cm/s AV Vmean:          90.500 cm/s AV VTI:            0.289 m AV Peak Grad:      7.4 mmHg AV Mean Grad:      4.0 mmHg LVOT Vmax:         79.00 cm/s LVOT Vmean:        52.200 cm/s LVOT VTI:          0.174 m LVOT/AV VTI ratio: 0.60  AORTA Ao Root diam: 3.20 cm Ao Asc diam:  3.10 cm MITRAL VALVE MV Area (PHT): 3.17 cm     SHUNTS MV Decel Time: 239 msec     Systemic VTI:  0.17 m MV E velocity: 52.80 cm/s   Systemic Diam: 2.00 cm MV A velocity: 102.00 cm/s MV E/A ratio:  0.52 Dorothye Gathers MD Electronically signed by Dorothye Gathers MD Signature Date/Time: 09/26/2023/2:46:16 PM    Final            Blood pressure 133/63, pulse 66, temperature 98 F (36.7 C), temperature source Oral, resp. rate 17, height 5\' 2"  (1.575 m), weight 72.1 kg, SpO2 98%.   Medical Problem List and Plan: 1. Functional deficits secondary to left thalamic infarct as well as history of right caudate head and right lateral thalamus infarct and bilateral CR small infarcts/parietal occipital infarct.  Loop recorder placed 2022             -patient may shower             -ELOS/Goals: 5-7 days, SPV PT/OT/SLP             - Stable for IRF admission  2.  Antithrombotics: -DVT/anticoagulation:  Pharmaceutical: Heparin              -antiplatelet therapy: Aspirin  81 mg daily and Plavix  75 mg day x 3 weeks then Plavix  alone 3. Pain Management: Neurontin  100 mg daily as needed, Tylenol  as needed 4.  Mood/Behavior/Sleep: Effexor  150 mg daily, Zoloft  50 mg daily             -antipsychotic agents: N/A 5. Neuropsych/cognition: This patient is capable of making decisions on her own behalf. 6. Skin/Wound Care: Routine skin checks 7. Fluids/Electrolytes/Nutrition: Routine in and outs with follow-up chemistries 8.  CKD stage III.  Creatinine baseline 1.27-2.0.  Follow-up chemistries 9.  Diabetes mellitus.  Hemoglobin A1c 5.6.  Currently SSI.  Prior to admission patient on Lantus  insulin  30 units daily as well as Farxiga 10 mg daily and resume as needed  10.  COPD/history of tobacco use.  Continue inhalers as advised as well as Pirfenidone  3 times daily 11.  Hyperlipidemia.  Crestor /Zetia  12.  Permissive  hypertension.  Patient on Norvasc  10 mg daily, hydralazine  25 mg every 8 hours prior to admission.  Resume as needed 13.  History of rectal cancer.  Follow-up outpatient Dr. Maryalice Smaller 14.  GERD.  Protonix    Sterling Eisenmenger, PA-C 09/28/2023

## 2023-09-29 NOTE — Plan of Care (Signed)
  Problem: RH Swallowing Goal: LTG Patient will consume least restrictive diet using compensatory strategies with assistance (SLP) Description: LTG:  Patient will consume least restrictive diet using compensatory strategies with assistance (SLP) Flowshe

## 2023-09-30 DIAGNOSIS — I6381 Other cerebral infarction due to occlusion or stenosis of small artery: Secondary | ICD-10-CM | POA: Diagnosis not present

## 2023-09-30 LAB — GLUCOSE, CAPILLARY
Glucose-Capillary: 164 mg/dL — ABNORMAL HIGH (ref 70–99)
Glucose-Capillary: 117 mg/dL — ABNORMAL HIGH (ref 70–99)
Glucose-Capillary: 147 mg/dL — ABNORMAL HIGH (ref 70–99)
Glucose-Capillary: 160 mg/dL — ABNORMAL HIGH (ref 70–99)

## 2023-09-30 MED ORDER — TRAZODONE HCL 50 MG PO TABS
50.0000 mg | ORAL_TABLET | Freq: Every day | ORAL | Status: DC
  Administered 2023-09-30 – 2023-10-03 (×4): 50 mg via ORAL
  Filled 2023-09-30 (×4): qty 1

## 2023-09-30 MED ORDER — BUTALBITAL-APAP-CAFFEINE 50-325-40 MG PO TABS: 1.0000 | ORAL_TABLET | Freq: Four times a day (QID) | ORAL | Status: DC | PRN

## 2023-09-30 MED ORDER — TOPIRAMATE 25 MG PO TABS
50.0000 mg | ORAL_TABLET | Freq: Every day | ORAL | Status: DC
  Administered 2023-09-30 – 2023-10-01 (×2): 50 mg via ORAL
  Filled 2023-09-30 (×2): qty 2

## 2023-09-30 NOTE — Progress Notes (Signed)
 Occupational Therapy Session Note  Patient Details  Name: Erica [name] MRN: [id number] Date of Birth: November 29, 1955  Today's Date: 09/30/2023 OT Individual Time: 1300-1410 OT Individual Time Calculation (min): 70 min    Short Term Goals: Week 1:  OT Short Term Goal 1 (Week 1): STG=LTG d/t ELOS  Skilled Therapeutic Interventions/Progress Updates:     Pt received sitting EOB receptive to skilled OT session reporting 0/10 pain- OT offering intermittent rest breaks, repositioning, and therapeutic support to optimize participation in therapy session. Pt reporting that it has been a tough day d/t the passing of one of her close friends- OT provided therapeutic support and encouragement with noted improvement in moral following. Pt requesting to take a shower this PM- focused this session on BADL retraining, activity tolerance, and energy conservation.   Pt completed functional mobility to bathroom using SPC and transferred to standard toilet with CGA +increased time. Pt with continent void & BM in toilet-documented in flowsheets. 3/3 toileting tasks completed while holding grab bars with CGA. Pt doffed clothing while seat on toilet with supervision- verbal cues required for safety to remain seated when doffing shirt. Ambulatory transfer to TTB positioned in walk-in shower using grab bars CGA. Pt completed majority of bathing tasks in seated position for energy conservation and to increase safety. Education provided on long-handled sponge use with Pt demonstrating teach back as evidence of learning. Pt able to complete UB bathing supervision and LB bathing CGA provided when standing to wash buttocks only. Following shower, Pt completed U/LB dressing seated EOB. Pt donned clean pull over head shirt set-up A and pants with CGA.   Engaged Pt in completing grooming/hygiene tasks in standing position for increased balance and activity tolerance challenge with Pt able to maintain balance while completing oral care  with CGA and manipulate small toiletry items without dropping- min verbal cues provided for positioning to increase safety during task. Pt fatigued following with seated rest break provided. Education provided of CVA etiology/recovery process and energy conservation techniques with Pt receptive to education.   Issued Pt red (soft) therputty with 12 beads place in putty to work on [facility] skills for improved functional use of L hand. Pt tasked with utilizing L hand to remove beads and transport them to cup- Pt able to complete with increased time and moderate effort, min dropping noted.   Pt was left sitting EOB with call bell in reach, bed alarm on, and all needs met.    Therapy Documentation Precautions:  Precautions Precautions: Fall Recall of Precautions/Restrictions: Intact Precaution/Restrictions Comments: h/o L sided weakness from previous stroke Restrictions Weight Bearing Restrictions Per Provider Order: No   Therapy/Group: Individual Therapy  Geoffery Kiel 09/30/2023, 1:19 PM

## 2023-09-30 NOTE — Plan of Care (Signed)
  Problem: RH Balance Goal: LTG Patient will maintain dynamic standing balance (PT) Description: LTG:  Patient will maintain dynamic standing balance with assistance during mobility activities (PT) Flowsheets (Taken 09/29/2023 1748) LTG: Pt will maintain dynamic standing balance during mobility activities with:: Independent with assistive device    Problem: Sit to Stand Goal: LTG:  Patient will perform sit to stand with assistance level (PT) Description: LTG:  Patient will perform sit to stand with assistance level (PT) Flowsheets (Taken 09/29/2023 1748) LTG: PT will perform sit to stand in preparation for functional mobility with assistance level: Independent   Problem: RH Bed Mobility Goal: LTG Patient will perform bed mobility with assist (PT) Description: LTG: Patient will perform bed mobility with assistance, with/without cues (PT). Flowsheets (Taken 09/29/2023 1748) LTG: Pt will perform bed mobility with assistance level of: Independent   Problem: RH Bed to Chair Transfers Goal: LTG Patient will perform bed/chair transfers w/assist (PT) Description: LTG: Patient will perform bed to chair transfers with assistance (PT). Flowsheets (Taken 09/29/2023 1748) LTG: Pt will perform Bed to Chair Transfers with assistance level: Independent with assistive device    Problem: RH Car Transfers Goal: LTG Patient will perform car transfers with assist (PT) Description: LTG: Patient will perform car transfers with assistance (PT). Flowsheets (Taken 09/29/2023 1748) LTG: Pt will perform car transfers with assist:: Independent with assistive device    Problem: RH Furniture Transfers Goal: LTG Patient will perform furniture transfers w/assist (OT/PT) Description: LTG: Patient will perform furniture transfers  with assistance (OT/PT). Flowsheets (Taken 09/29/2023 1748) LTG: Pt will perform furniture transfers with assist:: Independent with assistive device    Problem: RH Ambulation Goal: LTG Patient  will ambulate in home environment (PT) Description: LTG: Patient will ambulate in home environment, # of feet with assistance (PT). Flowsheets (Taken 09/29/2023 1748) LTG: Pt will ambulate in home environ  assist needed:: Independent with assistive device LTG: Ambulation distance in home environment: up to 50 ft per bout using LRAD Goal: LTG Patient will ambulate in community environment (PT) Description: LTG: Patient will ambulate in community environment, # of feet with assistance (PT). Flowsheets (Taken 09/29/2023 1748) LTG: Pt will ambulate in community environ  assist needed:: Supervision/Verbal cueing LTG: Ambulation distance in community environment: more than 300 ft using LRAD   Problem: RH Stairs Goal: LTG Patient will ambulate up and down stairs w/assist (PT) Description: LTG: Patient will ambulate up and down # of stairs with assistance (PT) Flowsheets (Taken 09/29/2023 1748) LTG: Pt will ambulate up/down stairs assist needed:: Supervision/Verbal cueing LTG: Pt will  ambulate up and down number of stairs: at least 4 steps using HRs in order to mobilize in community

## 2023-09-30 NOTE — Progress Notes (Signed)
 Speech Language Pathology Daily Session Note  Patient Details  Name: Erica [name] MRN: [id number] Date of Birth: August 28, 1955  Today's Date: 09/30/2023 SLP Individual Time: 0700-0800 SLP Individual Time Calculation (min): 60 min  Short Term Goals: Week 1: SLP Short Term Goal 1 (Week 1): STG = LTG due to ELOS  Skilled Therapeutic Interventions:   Pt was seen for skilled ST services targeting dysphagia, neurogenic stuttering education, and stroke sx education. Pt was sitting on edge of bed, eating breakfast. SLP observed pt with full meal. SLP did not discern any overt evidence of aspiration during breakfast. Pt noted with prolonged mastication, with patient reporting fatigue with meals. Pt reports she tends to eat softer foods at home. SLP offered downgrading diet to soft and bite (dys3), but patient preferred to stay on regular diet. SLP suggested selecting items from menu that would be considered "easy to chew" and provided examples. Also discussed cutting food into smaller bites with slowed rate of eating. After completion of breakfast, SLP educated on "BE FAST" mnemonic to help identify stroke sx. Pt reported she delayed seeking care because she wasn't aware that there could be face changes with CVA. Towards end of session, pt opened up about her stuttering from her previous CVA (most recent before this hospital stay). SLP educated on "pausing with purpose", taking a breath, and restarting speech in a slowed manner. Pt reports she will try this when speaking with her sisters next. Pt was left in room with NSG, all immediate needs within reach, and safety measures activated. Continued with current ST POC.   Pain Pain Assessment Pain Score: 6  Pain Location: Head Pain Intervention(s): RN made aware;Medication (See eMAR)  Therapy/Group: Individual Therapy  Lenward Railing 09/30/2023, 8:00 AM

## 2023-09-30 NOTE — Progress Notes (Signed)
 PROGRESS NOTE   Subjective/Complaints:  Pt report sleepy/tired. Also having HA's what she thinks are daily- at different times of day- Frontal and R temple- shoots to R ear.  Is emphatic tylenol  doesn't help HA at all- also says not sleeping well at night.   LBM yesterday per pt, but per chart was 4/16.     ROS-  Pt denies SOB, abd pain, CP, N/V/C/D, and vision changes   Objective:   No results found. Recent Labs    09/28/23 1713 09/29/23 0533  WBC 4.9 4.0  HGB 11.8* 11.6*  HCT 33.3* 32.1*  PLT 125* 114*   Recent Labs    09/28/23 1713 09/29/23 0533  NA  --  139  K  --  4.6  CL  --  112*  CO2  --  19*  GLUCOSE  --  127*  BUN  --  35*  CREATININE 1.91* 1.86*  CALCIUM   --  8.8*    Intake/Output Summary (Last 24 hours) at 09/30/2023 1038 Last data filed at 09/29/2023 1800 Gross per 24 hour  Intake 118 ml  Output --  Net 118 ml        Physical Exam: Vital Signs Blood pressure (!) 145/86, pulse 60, temperature 98.1 F (36.7 C), temperature source Oral, resp. rate 17, height 5\' 2"  (1.575 m), weight 73.5 kg, SpO2 100%.    General: awake, alert, appropriate, supine on side in bed; sleepy/tired appearing; NAD HENT: conjugate gaze; oropharynx moist CV: regular rate and rhythm; no JVD Pulmonary: CTA B/L; no W/R/R- good air movement GI: soft, NT, ND, (+)BS Psychiatric: appropriate, flat Neurological: fair memory- thought LBM yesterday- recorded at 4/16-  Neurologic: Oriented x 3, able to name ring, watch , stethoscope  motor strength is 4/5 in bilateral deltoid, bicep, tricep, grip, hip flexor, knee extensors, ankle dorsiflexor and plantar flexor Sensory exam normal sensation to light touch  in bilateral upper  extremities Cerebellar exam Mild dysmetria Left FNF but ok on right side Musculoskeletal: Full range of motion in all 4 extremities. No joint swelling    Assessment/Plan: 1. Functional deficits  which require 3+ hours per day of interdisciplinary therapy in a comprehensive inpatient rehab setting. Physiatrist is providing close team supervision and 24 hour management of active medical problems listed below. Physiatrist and rehab team continue to assess barriers to discharge/monitor patient progress toward functional and medical goals  Care Tool:  Bathing    Body parts bathed by patient: Right arm, Left arm, Chest, Abdomen, Front perineal area, Right upper leg, Left upper leg, Right lower leg, Left lower leg, Face         Bathing assist Assist Level: Moderate Assistance - Patient 50 - 74%     Upper Body Dressing/Undressing Upper body dressing   What is the patient wearing?: Pull over shirt    Upper body assist Assist Level: Supervision/Verbal cueing    Lower Body Dressing/Undressing Lower body dressing      What is the patient wearing?: Underwear/pull up, Pants     Lower body assist Assist for lower body dressing: Minimal Assistance - Patient > 75%     Toileting Toileting    Toileting assist Assist  for toileting: Minimal Assistance - Patient > 75%     Transfers Chair/bed transfer  Transfers assist     Chair/bed transfer assist level: Contact Guard/Touching assist     Locomotion Ambulation   Ambulation assist      Assist level: Contact Guard/Touching assist Assistive device: Cane-straight Max distance: 170 ft   Walk 10 feet activity   Assist     Assist level: Supervision/Verbal cueing Assistive device: Cane-straight   Walk 50 feet activity   Assist    Assist level: Contact Guard/Touching assist Assistive device: Cane-straight    Walk 150 feet activity   Assist    Assist level: Contact Guard/Touching assist Assistive device: Cane-straight    Walk 10 feet on uneven surface  activity   Assist     Assist level: Contact Guard/Touching assist Assistive device: Cane-straight   Wheelchair     Assist Is the patient  using a wheelchair?: Yes Type of Wheelchair: Manual    Wheelchair assist level: Dependent - Patient 0% Max wheelchair distance: 175 ft    Wheelchair 50 feet with 2 turns activity    Assist        Assist Level: Dependent - Patient 0%   Wheelchair 150 feet activity     Assist      Assist Level: Dependent - Patient 0%   Blood pressure (!) 145/86, pulse 60, temperature 98.1 F (36.7 C), temperature source Oral, resp. rate 17, height 5\' 2"  (1.575 m), weight 73.5 kg, SpO2 100%.  Medical Problem List and Plan: 1. 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  Loop recorder placed 2022             -patient may shower             -ELOS/Goals: 5-7 days, SPV PT/OT/SLP             Con't CIR  2.  Antithrombotics: -DVT/anticoagulation:  Pharmaceutical: Heparin              -antiplatelet therapy: Aspirin  81 mg daily and Plavix  75 mg day x 3 weeks then Plavix  alone 3. Pain Management: Neurontin  100 mg  daily as needed, Tylenol  as needed  4/19- added Fioricet prn for HA's 4. Mood/Behavior/Sleep: Effexor  150 mg daily, Zoloft  50 mg daily             -antipsychotic agents: N/A 5. Neuropsych/cognition: This patient is usually capable of making decisions on her own behalf. 6. Skin/Wound Care: Routine skin checks 7. Fluids/Electrolytes/Nutrition: Routine in and outs with follow-up chemistries 8.  CKD stage III.  Creatinine baseline 1.27-2.0.  Follow-up chemistries 9.  Diabetes mellitus.  Hemoglobin A1c 5.6.  Currently SSI.  Prior to admission patient on Lantus  insulin  30 units daily as well as Farxiga 10 mg daily and resume as needed  CBG (last 3)  Recent Labs    09/29/23 1652 09/29/23 2106 09/30/23 0602  GLUCAP 140* 201* 164*  Resume Lantus  10U at bedtime , titrate upwards 4/19- First dose of Lantus  last night- CBGs up to 201- will  let it calm down and will con't to titrate as needed 10.  COPD/history of tobacco use as well as Idiopathic Pulmonary Fibrosis.  Continue inhalers as advised as well as Pirfenidone  3 times daily.  F/u with Dr Bertrum Brodie , pulmonary  11.  Hyperlipidemia.  Crestor /Zetia  12.  Permissive hypertension.  Patient on Norvasc  10 mg daily,  hydralazine  25 mg every 8 hours prior to admission.  Resume as needed Vitals:   09/29/23 1932 09/30/23 0429  BP: 128/69 (!) 145/86  Pulse: 67 60  Resp: 18 17  Temp: (!) 97.5 F (36.4 C) 98.1 F (36.7 C)  SpO2: 100% 100%   Some lability , monitor on current dose   4/19- pt BP is controlled- cont' regime 13.  History of rectal cancer.  Follow-up outpatient Dr. Maryalice Smaller 14.  GERD.  Protonix   15. Daily HA's- per pt-   4/19- will add Topamax  50 mg at bedtime and Fioricet as needed for HA's.  16. Insomnia  4/19- will add Trazodone  50 mg at bedtime for sleep   I spent a total of 39   minutes on total care today- >50% coordination of care- due to  D/w Nursing about her HA's- as well as sleep- and review of info with pt- as well as review of labs and vitals.   LOS: 2 days A FACE TO FACE EVALUATION WAS PERFORMED  Derran Sear 09/30/2023, 10:38 AM

## 2023-09-30 NOTE — Progress Notes (Signed)
 Physical Therapy Session Note  Patient Details  Name: Erica [name] MRN: [id number] Date of Birth: [birth year]  Today's Date: 09/30/2023 PT Individual Time: 3875-6433 PT Individual Time Calculation (min): 24 min  and Today's Date: 09/30/2023 PT Missed Time: 36 Minutes Missed Time Reason: Patient fatigue;Patient unwilling to participate;Other (Comment) (depressed d/t family friend passing away today)  Short Term Goals: Week 1:  PT Short Term Goal 1 (Week 1): STG = LTG d/t ELOS  Skilled Therapeutic Interventions/Progress Updates:  Patient supine in bed and staring at ceiling on entrance to room. Patient alert, but relates being tired and not initially agreeable to PT session. No pain complaint, but relates that she has not been able to sleep well overnight since being in the hospital. Discussed potential need for sleeping and and pt open to trial. RN notified who then notified MD on duty and pt now has medication to take before bedtime in order to improve sleep and improve progress during stay.   TED hose retrieved from medical closet to don prior to session OOB. Initiating donning of TED hose when pt then also relates that family friend is also in the hospital after having a massive heart attack. After talking with family and friends, she has found out that he will not live longer than today as family has decided to pull pt off of life support today. She is very depressed and states she will be "taking today off of therapy." Since pt is fatigued and in low spirits today, she is not open to education re: need to participate in order to continue progress and healing from most recent stroke.   Patient allowed to rest and attempt to sleep d/t fatigue at end of session with bed brakes locked, bed alarm set, and all needs within reach. Provided with ice on pt request.   Pt missed 36 min of skilled therapy due to fatigue and depressed mood d/t friends passing.  Will re-attempt as schedule and pt  availability permits.   Therapy Documentation Precautions:  Precautions Precautions: Fall Recall of Precautions/Restrictions: Intact Precaution/Restrictions Comments: h/o L sided weakness from previous stroke Restrictions Weight Bearing Restrictions Per Provider Order: No  Pain:  No pain related this session.   Therapy/Group: Individual Therapy  Donne Gage PT, DPT, CSRS 09/30/2023, 12:10 PM

## 2023-10-01 LAB — GLUCOSE, CAPILLARY
Glucose-Capillary: 130 mg/dL — ABNORMAL HIGH (ref 70–99)
Glucose-Capillary: 145 mg/dL — ABNORMAL HIGH (ref 70–99)
Glucose-Capillary: 130 mg/dL — ABNORMAL HIGH (ref 70–99)
Glucose-Capillary: 159 mg/dL — ABNORMAL HIGH (ref 70–99)

## 2023-10-02 ENCOUNTER — Ambulatory Visit: Admitting: Physical Therapy

## 2023-10-02 ENCOUNTER — Ambulatory Visit

## 2023-10-02 ENCOUNTER — Ambulatory Visit: Admitting: Occupational Therapy

## 2023-10-02 ENCOUNTER — Inpatient Hospital Stay (HOSPITAL_COMMUNITY)

## 2023-10-02 DIAGNOSIS — Z794 Long term (current) use of insulin: Secondary | ICD-10-CM | POA: Diagnosis not present

## 2023-10-02 DIAGNOSIS — E1149 Type 2 diabetes mellitus with other diabetic neurological complication: Secondary | ICD-10-CM | POA: Diagnosis not present

## 2023-10-02 DIAGNOSIS — I6381 Other cerebral infarction due to occlusion or stenosis of small artery: Secondary | ICD-10-CM | POA: Diagnosis not present

## 2023-10-02 LAB — GLUCOSE, CAPILLARY
Glucose-Capillary: 124 mg/dL — ABNORMAL HIGH (ref 70–99)
Glucose-Capillary: 130 mg/dL — ABNORMAL HIGH (ref 70–99)
Glucose-Capillary: 93 mg/dL (ref 70–99)
Glucose-Capillary: 116 mg/dL — ABNORMAL HIGH (ref 70–99)

## 2023-10-02 MED ORDER — GABAPENTIN 100 MG PO CAPS
100.0000 mg | ORAL_CAPSULE | Freq: Three times a day (TID) | ORAL | Status: DC
  Administered 2023-10-02 – 2023-10-10 (×25): 100 mg via ORAL
  Filled 2023-10-02 (×25): qty 1

## 2023-10-02 MED ORDER — CARBAMIDE PEROXIDE 6.5 % OT SOLN
5.0000 [drp] | Freq: Two times a day (BID) | OTIC | Status: AC
  Administered 2023-10-02 – 2023-10-05 (×8): 5 [drp] via OTIC
  Filled 2023-10-02: qty 15

## 2023-10-02 NOTE — Progress Notes (Signed)
 PROGRESS NOTE   Subjective/Complaints:   ROS-  Pt denies SOB, abd pain, CP, N/V/C/D, and vision changes   Objective:   No results found. No results for input(s): "WBC", "HGB", "HCT", "PLT" in the last 72 hours.  No results for input(s): "NA", "K", "CL", "CO2", "GLUCOSE", "BUN", "CREATININE", "CALCIUM " in the last 72 hours.   Intake/Output Summary (Last 24 hours) at 10/02/2023 0801 Last data filed at 10/01/2023 2048 Gross per 24 hour  Intake 600 ml  Output --  Net 600 ml        Physical Exam: Vital Signs Blood pressure (!) 144/73, pulse 71, temperature 97.9 F (36.6 C), temperature source Oral, resp. rate 18, height 5\' 2"  (1.575 m), weight 73.5 kg, SpO2 97%.  General: No acute distress Mood and affect are appropriate Heart: Regular rate and rhythm no rubs murmurs or extra sounds Lungs: Clear to auscultation, breathing unlabored, no rales or wheezes Abdomen: Positive bowel sounds, soft nontender to palpation, nondistended Extremities: No clubbing, cyanosis, or edema Skin: No evidence of breakdown, no evidence of rash ENT- ceruminosis Right ear canal, no erythema or tenderness with exam , TM not visualized  Neurologic: Oriented x 3, motor strength is 4/5 in bilateral deltoid, bicep, tricep, grip, hip flexor, knee extensors, ankle dorsiflexor and plantar flexor Sensory exam Reduced sensation left arm and right V2, V3 as well as temporal scalp area  Cerebellar exam Mild dysmetria Left FNF but ok on right side Musculoskeletal: Full range of motion in all 4 extremities. No joint swelling    Assessment/Plan: 1. Functional deficits which require 3+ hours per day of interdisciplinary therapy in a comprehensive inpatient rehab setting. Physiatrist is providing close team supervision and 24 hour management of active medical problems listed below. Physiatrist and rehab team continue to assess barriers to discharge/monitor  patient progress toward functional and medical goals  Care Tool:  Bathing    Body parts bathed by patient: Right arm, Left arm, Chest, Abdomen, Front perineal area, Right upper leg, Left upper leg, Right lower leg, Left lower leg, Face         Bathing assist Assist Level: Moderate Assistance - Patient 50 - 74%     Upper Body Dressing/Undressing Upper body dressing   What is the patient wearing?: Pull over shirt    Upper body assist Assist Level: Supervision/Verbal cueing    Lower Body Dressing/Undressing Lower body dressing      What is the patient wearing?: Underwear/pull up, Pants     Lower body assist Assist for lower body dressing: Minimal Assistance - Patient > 75%     Toileting Toileting    Toileting assist Assist for toileting: Minimal Assistance - Patient > 75%     Transfers Chair/bed transfer  Transfers assist     Chair/bed transfer assist level: Contact Guard/Touching assist     Locomotion Ambulation   Ambulation assist      Assist level: Contact Guard/Touching assist Assistive device: Cane-straight Max distance: 170 ft   Walk 10 feet activity   Assist     Assist level: Supervision/Verbal cueing Assistive device: Cane-straight   Walk 50 feet activity   Assist    Assist level: Contact Guard/Touching  assist Assistive device: Cane-straight    Walk 150 feet activity   Assist    Assist level: Contact Guard/Touching assist Assistive device: Cane-straight    Walk 10 feet on uneven surface  activity   Assist     Assist level: Contact Guard/Touching assist Assistive device: Cane-straight   Wheelchair     Assist Is the patient using a wheelchair?: Yes Type of Wheelchair: Manual    Wheelchair assist level: Dependent - Patient 0% Max wheelchair distance: 175 ft    Wheelchair 50 feet with 2 turns activity    Assist        Assist Level: Dependent - Patient 0%   Wheelchair 150 feet activity      Assist      Assist Level: Dependent - Patient 0%   Blood pressure (!) 144/73, pulse 71, temperature 97.9 F (36.6 C), temperature source Oral, resp. rate 18, height 5\' 2"  (1.575 m), weight 73.5 kg, SpO2 97%.  Medical Problem List and Plan: 1. 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  Loop recorder placed 2022             -patient may shower             -ELOS/Goals: 5-7 days, SPV PT/OT/SLP             Con't CIR  2.  Antithrombotics: -DVT/anticoagulation:  Pharmaceutical: Heparin              -antiplatelet therapy: Aspirin  81 mg daily and Plavix  75 mg day x 3 weeks then Plavix  alone 3. Pain Management: Neurontin  100 mg  daily as needed, Tylenol  as needed  4/19- added Fioricet prn for HA's 4. Mood/Behavior/Sleep: Effexor  150 mg daily, Zoloft  50 mg daily             -antipsychotic agents: N/A 5. Neuropsych/cognition: This patient is usually capable of making decisions on her own behalf. 6. Skin/Wound Care: Routine skin checks 7. Fluids/Electrolytes/Nutrition: Routine in and outs with follow-up chemistries 8.  CKD stage III.  Creatinine baseline 1.27-2.0.  Follow-up chemistries    Latest Ref Rng & Units 09/29/2023    5:33 AM 09/28/2023    5:13 PM 09/27/2023    9:03 AM  BMP  Glucose 70 - 99 mg/dL 865   784   BUN 8 - 23 mg/dL 35   26   Creatinine 6.96 - 1.00 mg/dL 2.95  2.84  1.32   Sodium 135 - 145 mmol/L 139   140   Potassium 3.5 - 5.1 mmol/L 4.6   4.7   Chloride 98 - 111 mmol/L 112   108   CO2 22 - 32 mmol/L 19   21   Calcium  8.9 - 10.3 mg/dL 8.8   8.8     9.  Diabetes mellitus.  Hemoglobin A1c 5.6.  Currently SSI.  Prior to admission patient on Lantus  insulin  30 units daily as well as Farxiga 10 mg daily and resume as needed  CBG (last 3)  Recent Labs    10/01/23 1726 10/01/23 2104 10/02/23 0540  GLUCAP 130* 159*  124*  Resume Lantus  10U at bedtime , titrate upwards 4/19- First dose of Lantus  last night- CBGs up to 201- will let it calm down and will con't to titrate as needed 10.  COPD/history of tobacco use as well as Idiopathic Pulmonary Fibrosis.  Continue inhalers as advised as well as  Pirfenidone  3 times daily.  F/u with Dr Bertrum Brodie , pulmonary  11.  Hyperlipidemia.  Crestor /Zetia  12.  Permissive hypertension.  Patient on Norvasc  10 mg daily, hydralazine  25 mg every 8 hours prior to admission.  Resume as needed Vitals:   10/01/23 1938 10/02/23 0306  BP: (!) 143/86 (!) 144/73  Pulse: 60 71  Resp: 16 18  Temp: 98 F (36.7 C) 97.9 F (36.6 C)  SpO2: 100% 97%   Some lability , monitor on current dose   4/19- pt BP is controlled- cont' regime 13.  History of rectal cancer.  Follow-up outpatient Dr. Maryalice Smaller 14.  GERD.  Protonix   15. Daily HA's- actually more characteristic of CVA related dysesthetic pain.  Will change gabapentin  to 100mg  TID (was on prn) , may d/c topamax   16. Insomnia  4/19- will add Trazodone  50 mg at bedtime for sleep  17.  Ceruminosis add debrox LOS: 4 days A FACE TO FACE EVALUATION WAS PERFORMED  Erica [name] 10/02/2023, 8:01 AM

## 2023-10-02 NOTE — Procedures (Signed)
 Modified Barium Swallow Study  Patient Details  Name: Erica [name] MRN: [id number] Date of Birth: [birth year]  Today's Date: 10/02/2023  Modified Barium Swallow completed.  Full report located under Chart Review in the Imaging Section.  History of Present Illness Ms. 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 CT head without contrast negative for acute finding. MRI revealed acute lacunar infarct in the left thalamus.   Clinical Impression Patient presents with functional oropharyngeal swallow with thin liquid PO, though unable to truly assess oropharyngeal swallow with puree consistencies. Oral phase is remarkable for occasional repetitive tongue motion, though adequate clearance of oral cavity and no labial escape. Pharyngeal swallow initiated mildly delayed to the level of the epiglottis vs pyriforms. Pharyngeal phase is remarkable for trace base of tongue residuals, and with no penetration/aspiration present. Patient with a suspected CP bar, though no other anatomical abnormalities observed. During POs of puree, patient stating she "cannot swallow" and "her throat is sore." No true lingual movement observed in attempts to swallow, therefore suspect oral holding is psychological rather than a true anatomical or physiological deficit. Per patients nurse, patient consumed medications and breakfast with no difficulty this AM. Due to patients throat pain, she reports preference of D2 diet/thin. Recommend full supervision at this time to ensure oral clearance.   DIGEST Swallow Severity Rating*  Safety: 0  Efficiency: 0  Overall Pharyngeal Swallow Severity: 0 1: mild; 2: moderate; 3: severe; 4: profound  *The Dynamic Imaging Grade of  Swallowing Toxicity is standardized for the head and neck cancer population, however, demonstrates promising clinical applications across populations to standardize the clinical rating of pharyngeal swallow safety and severity.   Factors that may increase risk of adverse event in presence of aspiration Roderick Civatte & Jessy Morocco 2021): Reduced cognitive function  Swallow Evaluation Recommendations Recommendations: PO diet PO Diet Recommendation: Dysphagia 2 (Finely chopped);Thin liquids (Level 0) Liquid Administration via: Cup;Straw Medication Administration: Whole meds with liquid Supervision: Patient able to self-feed;Full supervision/cueing for swallowing strategies Swallowing strategies  : Minimize environmental distractions;Slow rate;Small bites/sips Postural changes: Position pt fully upright for meals Oral care recommendations: Oral care BID (2x/day)   Kahlea Cobert M.A., CCC-SLP 10/02/2023,9:43 AM

## 2023-10-02 NOTE — Plan of Care (Signed)
  Problem: Consults Goal: RH STROKE PATIENT EDUCATION Description: See Patient Education module for education specifics  Outcome: Progressing   Problem: RH BOWEL ELIMINATION Goal: RH STG MANAGE BOWEL WITH ASSISTANCE Description: STG Manage Bowel with toileting Assistance. Outcome: Progressing   Problem: RH BOWEL ELIMINATION Goal: RH STG MANAGE BOWEL W/MEDICATION W/ASSISTANCE Description: STG Manage Bowel with Medication with mod I Assistance. Outcome: Progressing   Problem: RH SAFETY Goal: RH STG ADHERE TO SAFETY PRECAUTIONS W/ASSISTANCE/DEVICE Description: STG Adhere to Safety Precautions With cues Assistance/Device. Outcome: Progressing   Problem: RH PAIN MANAGEMENT Goal: RH STG PAIN MANAGED AT OR BELOW PT'S PAIN GOAL Description: < 4 with prns Outcome: Progressing   Problem: RH KNOWLEDGE DEFICIT Goal: RH STG INCREASE KNOWLEDGE OF DIABETES Description: Patient and son will be able to manage DM using educational resources for medications and dietary modification independently Outcome: Progressing

## 2023-10-02 NOTE — Therapy (Deleted)
 [facility] [address] [place], [place], [address] Phone: [phone]   Fax:  [phone]  Patient Details  Name: Erica [name] MRN: [id number] Date of Birth: [birth year] Referring Provider:  Johny Nap, NP   Encounter Date: 10/02/2023  SPEECH THERAPY DISCHARGE SUMMARY  Visits from Start of Care: 2  Current functional level related to goals / functional outcomes: Unknown at this time as pt discharged d/t new stroke   Remaining deficits: Unknown in light of new stroke   Education / Equipment: Cognitive compensations/strategies   Patient agrees to discharge. Patient goals were not met. Patient is being discharged due to a change in medical status.Neva Barban, Student-SLP 10/02/2023, 7:46 AM  [facility] [address] [place], [place], [address] Phone: [phone]   Fax:  [phone]

## 2023-10-02 NOTE — Progress Notes (Signed)
 Occupational Therapy Session Note  Patient Details  Name: Erica [name] MRN: [id number] Date of Birth: [birth year]  Today's Date: 10/02/2023 OT Individual Time: 0865-7846 OT Individual Time Calculation (min): 62 min  OT Individual Time: 9629-5284 OT Individual Time Calculation (min): 46 min  Short Term Goals: Week 1:  OT Short Term Goal 1 (Week 1): STG=LTG d/t ELOS  Skilled Therapeutic Interventions/Progress Updates:     AM Session: Pt received reclined in bed, dressed and ready for the day, politely declining need for shower this AM. Pt presenting to be fatigued upon OT arrival reporting her throat was sore stating "I think taking the big pills for my lungs is irritating my throat.Erica AasAaron AasI could not swallow this AM during my swallow test." Offered to inform RN or MD to see if medication are available for sore throat, however Pt declining during session reporting "I already take enough medications". Per SLP note from MBS this AM, Pt's current swallow deficits are likely psychological rather than a true anatomical or physiological deficit. Pt receptive to completing light, low intensity activities during skilled OT session reporting 6/10 pain in her throat- OT offering intermittent rest breaks, repositioning, and therapeutic support to optimize participation in therapy session. Pt moving increasingly slow this session and presenting with some self-limiting behaviors.   During session, Pt reporting concerns with anticipated d/c plan of d/c'ing back to her home with supervision from her family and aids. Pt reporting she is unsure if 24/7 supervision will be provided as her sister checks in ~1hr per day, son is available to supervision 2-4 hrs/day, and she has ADL/IADL assistance from aids 2 hrs/day. Pt did state that she always carries a phone with her, wears her life alert system, and has cameras installed so that her son can check in on her periodically throughout the day. Pt inquiring if SNF could be an  option at d/c. Provided education that she is doing well in therapy a this time and is completing BADLs, transfers, and mobility using SPC with CGA overall with intermittent min A when fatigued- educated Pt that she will likely continue to progress and be mod I for some BADLs and household functional mobility at d/c and SNF would not be appropriate in that case. Pt receptive to education, however becoming emotional and presenting to be overwhelmed- maximal therapeutic support and encouragement provided.   Pt requesting to use restroom and complete oral care. Pt transitioned to EOB and completed functional mobility to bathroom using SPC with CGA provided for balance +increased time d/t slowed gait. 3/3 toileting tasks completed with CGA following continent B&B documented in flowsheets. Pt ambulated to sink and washed hands in standing position CGA with seated rest break provided following. Pt then able to complete oral care and wash face in standing position CGA tolerating standing ~5 min.   At end of session, Pt fatigued and requesting to return to bed. Upon sitting EOB, Pt reporting a "freezing" sensation on R side of her head and stating "I think I could be having another stroke". Completed physical assessment with no changes in Pt's speech, face, eyes, strength, balance, or sensation noted. Informed RN and PA of Pt's current status with RN in/out to complete assessment. Vitals assessed in supine: BP 135/87(101) HR 70 PSO2 100%. PA, [name], reporting Pt's symptoms more likely psychological d/t Pt with hx of depression and potential anxiety related to having numerous previous CVAs.   Pt was left resting in bed with call bell in reach, bed alarm on, and  all needs met.    PM Session: Pt received lightly sleeping in bed, waking upon OT arrival. Pt presenting to be quiet and fatigued. Pt receptive to skilled OT session with moderate therapeutic support reporting 0/10 pain- OT offering intermittent rest breaks,  repositioning, and therapeutic support to optimize participation in therapy session. Pt expressing "it's just one of those days I don't feel like doing anything"- OT provided therapeutic support and inquired about prevalence of Pt's experiencing days like this prior to hospital admission, engaging Pt in discussion identifying supports that help her become motivated. Pt able to identify her son, Erica [name], as one motivational support and identify meaningful activities such as puzzles and cooking as meaningful activities. Continued to engage Pt in light hearted conversations and provide therapeutic support throughout session to support moral.   Pt requesting to use restroom. Transitioned to EOB using bed features with supervision +increased time. Sit > stand from EOB using SPC CGA. Pt completed functional mobility to bathroom with CGA using SPC +increased time d/t shuffled step-to gait pattern with verbal cues required for safety d/t Pt attempting to reach for object in environment for balance. Pt completed 3/3 toileting tasks following continent void with CGA provided for balance. Pt changed brief and pants while seated on toilet with CGA provided for safety.   Engaged Pt in completing picture replication task seated EOB to work on functional cognition skills and [facility]. Pt tasked with utilizing L hand to place pegs to replicate moderate complexity pattern. Pt able to complete ~75% of task with min questioning cues to notice mistakes and attend to task +significantly increased amount of time d/t Pt's attention, problem solving, and working memory deficits. Pt requesting to not finish activity and return to bed.   EOB > supine min A +increased time. Pt was left resting in bed with call bell in reach, bed alarm on, and all needs met.    Therapy Documentation Precautions:  Precautions Precautions: Fall Recall of Precautions/Restrictions: Intact Precaution/Restrictions Comments: h/o L sided weakness from previous  stroke Restrictions Weight Bearing Restrictions Per Provider Order: No  Therapy/Group: Individual Therapy  Geoffery Kiel 10/02/2023, 7:55 AM

## 2023-10-02 NOTE — Progress Notes (Signed)
 Physical Therapy Session Note  Patient Details  Name: SIDONIE DEXHEIMER MRN: [id number] Date of Birth: 12/15/1955  Today's Date: 10/02/2023 PT Individual Time: 1032-1104 PT Individual Time Calculation (min): 32 min   Short Term Goals: Week 1:  PT Short Term Goal 1 (Week 1): STG = LTG d/t ELOS  Skilled Therapeutic Interventions/Progress Updates:  Patient supine in bed asleep on entrance to room. Patient alert and agreeable to PT session.   Patient with no pain complaint at start of session.  Therapeutic Activity: Bed Mobility: Pt performed supine <> sit with ***. VC/ tc required for ***. Transfers: Pt performed sit<>stand and stand pivot transfers throughout session with ***. Provided vc/ tc for***.  Gait Training:  Pt ambulated *** ft using *** with ***. Demonstrated ***. Provided vc/ tc for ***.  Wheelchair Mobility:  Pt propelled wheelchair *** feet with ***. Provided vc/ tc for ***.  Neuromuscular Re-ed: NMR facilitated during session with focus on ***. Pt guided in ***. NMR performed for improvements in motor control and coordination, balance, sequencing, judgement, and self confidence/ efficacy in performing all aspects of mobility at highest level of independence.   Therapeutic Exercise: Pt performed the following exercises with vc/ tc for proper technique. ***  Patient *** at end of session with brakes locked, *** alarm set, and all needs within reach.   Therapy Documentation Precautions:  Precautions Precautions: Fall Recall of Precautions/Restrictions: Intact Precaution/Restrictions Comments: h/o L sided weakness from previous stroke Restrictions Weight Bearing Restrictions Per Provider Order: No General:   Vital Signs:   Pain:    Therapy/Group: Individual Therapy  Donne Gage PT, DPT, CSRS 10/02/2023, 12:19 PM

## 2023-10-02 NOTE — Progress Notes (Signed)
 Speech Language Pathology Daily Session Note  Patient Details  Name: [name] MRN: [id number] Date of Birth: 02-17-1956  Today's Date: 10/02/2023 SLP Individual Time: 0255-0335 SLP Individual Time Calculation (min): 40 min  Short Term Goals: Week 1: SLP Short Term Goal 1 (Week 1): STG = LTG due to ELOS  Skilled Therapeutic Interventions:  Patient was seen in PM to address cognitive re- training. Pt was easily alerted upon SLP arrival. She was able to verbalize completion of MBSS and new diet. She endorsed feeling "nervous" and inability to swallow during test. SLP reviewed results as well as current dietary recommendations. In other minutes of session, SLP challenged pt recall of BE FAST stroke symptoms. Pt unable to recall symptoms despite instruction and education completed on previous day. SLP reviewed strategies and after a 15 minute distracted delay, pt recalled 3 symptoms given min A. In other minutes of session SLP address speech intelligibility and fluency. SLP introduced easy onset technique and cancellation methods. SLP subsequently challenged pt in use of strategies through a guided conversational exchange. Pt intermittently benefiting from cues for alertness during task though only 2 instances of dysfluency observed with pt able to utilize strategies with sup A. Pt left in bed at conclusion of session with bed alarm active and cal button within reach. SLP to continue POC.   Pain Pain Assessment Pain Scale: 0-10 Pain Score: 0-No pain  Therapy/Group: Individual Therapy  Adela Holter 10/02/2023, 3:37 PM

## 2023-10-02 NOTE — Plan of Care (Signed)
  Problem: Consults Goal: RH STROKE PATIENT EDUCATION Description: See Patient Education module for education specifics  Outcome: Progressing   Problem: RH BOWEL ELIMINATION Goal: RH STG MANAGE BOWEL WITH ASSISTANCE Description: STG Manage Bowel with toileting Assistance. Outcome: Progressing Goal: RH STG MANAGE BOWEL W/MEDICATION W/ASSISTANCE Description: STG Manage Bowel with Medication with mod I Assistance. Outcome: Progressing   Problem: RH SAFETY Goal: RH STG ADHERE TO SAFETY PRECAUTIONS W/ASSISTANCE/DEVICE Description: STG Adhere to Safety Precautions With cues Assistance/Device. Outcome: Progressing   Problem: RH PAIN MANAGEMENT Goal: RH STG PAIN MANAGED AT OR BELOW PT'S PAIN GOAL Description: < 4 with prns Outcome: Progressing   Problem: RH KNOWLEDGE DEFICIT Goal: RH STG INCREASE KNOWLEDGE OF DIABETES Description: Patient and son will be able to manage DM using educational resources for medications and dietary modification independently Outcome: Progressing Goal: RH STG INCREASE KNOWLEDGE OF HYPERTENSION Description: Patient and son will be able to manage HTN using educational resources for medications and dietary modification independently Outcome: Progressing Goal: RH STG INCREASE KNOWLEGDE OF HYPERLIPIDEMIA Description: Patient and son will be able to manage HLD using educational resources for medications and dietary modification independently Outcome: Progressing Goal: RH STG INCREASE KNOWLEDGE OF STROKE PROPHYLAXIS Description: Patient and son will be able to manage secondary risk using educational resources for medications and dietary modification independently Outcome: Progressing

## 2023-10-03 DIAGNOSIS — Z794 Long term (current) use of insulin: Secondary | ICD-10-CM | POA: Diagnosis not present

## 2023-10-03 DIAGNOSIS — E1149 Type 2 diabetes mellitus with other diabetic neurological complication: Secondary | ICD-10-CM | POA: Diagnosis not present

## 2023-10-03 DIAGNOSIS — I6381 Other cerebral infarction due to occlusion or stenosis of small artery: Secondary | ICD-10-CM | POA: Diagnosis not present

## 2023-10-03 LAB — GLUCOSE, CAPILLARY
Glucose-Capillary: 96 mg/dL (ref 70–99)
Glucose-Capillary: 112 mg/dL — ABNORMAL HIGH (ref 70–99)
Glucose-Capillary: 138 mg/dL — ABNORMAL HIGH (ref 70–99)
Glucose-Capillary: 185 mg/dL — ABNORMAL HIGH (ref 70–99)

## 2023-10-03 MED ORDER — AMLODIPINE BESYLATE 5 MG PO TABS
5.0000 mg | ORAL_TABLET | Freq: Every day | ORAL | Status: DC
  Administered 2023-10-03 – 2023-10-04 (×2): 5 mg via ORAL
  Filled 2023-10-03 (×2): qty 1

## 2023-10-03 NOTE — Plan of Care (Signed)
  Problem: Consults Goal: RH STROKE PATIENT EDUCATION Description: See Patient Education module for education specifics  Outcome: Progressing   Problem: RH BOWEL ELIMINATION Goal: RH STG MANAGE BOWEL WITH ASSISTANCE Description: STG Manage Bowel with toileting Assistance. Outcome: Progressing Goal: RH STG MANAGE BOWEL W/MEDICATION W/ASSISTANCE Description: STG Manage Bowel with Medication with mod I Assistance. Outcome: Progressing   Problem: RH SAFETY Goal: RH STG ADHERE TO SAFETY PRECAUTIONS W/ASSISTANCE/DEVICE Description: STG Adhere to Safety Precautions With cues Assistance/Device. Outcome: Progressing   Problem: RH PAIN MANAGEMENT Goal: RH STG PAIN MANAGED AT OR BELOW PT'S PAIN GOAL Description: < 4 with prns Outcome: Progressing   Problem: RH KNOWLEDGE DEFICIT Goal: RH STG INCREASE KNOWLEDGE OF DIABETES Description: Patient and son will be able to manage DM using educational resources for medications and dietary modification independently Outcome: Progressing Goal: RH STG INCREASE KNOWLEDGE OF HYPERTENSION Description: Patient and son will be able to manage HTN using educational resources for medications and dietary modification independently Outcome: Progressing Goal: RH STG INCREASE KNOWLEGDE OF HYPERLIPIDEMIA Description: Patient and son will be able to manage HLD using educational resources for medications and dietary modification independently Outcome: Progressing Goal: RH STG INCREASE KNOWLEDGE OF STROKE PROPHYLAXIS Description: Patient and son will be able to manage secondary risk using educational resources for medications and dietary modification independently Outcome: Progressing

## 2023-10-03 NOTE — Progress Notes (Signed)
 Physical Therapy Session Note  Patient Details  Name: Erica [name] MRN: [id number] Date of Birth: [birth year]  Today's Date: 10/03/2023 PT Individual Time: 1110-1200 PT Individual Time Calculation (min): 50 min   Short Term Goals: Week 1:  PT Short Term Goal 1 (Week 1): STG = LTG d/t ELOS Week 2:     Skilled Therapeutic Interventions/Progress Updates:      Therapy Documentation Precautions:  Precautions Precautions: Fall Recall of Precautions/Restrictions: Intact Precaution/Restrictions Comments: h/o L sided weakness from previous stroke Restrictions Weight Bearing Restrictions Per Provider Order: No General:   Vital Signs: Therapy Vitals Temp: 97.9 F (36.6 C) Pulse Rate: 72 Resp: 17 BP: 124/71 Patient Position (if appropriate): Lying Oxygen Therapy SpO2: 100 % O2 Device: Room Air Pain: Pain Assessment Pain Scale: 0-10 Pain Score: 0-No pain Faces Pain Scale: No hurt Mobility:   Locomotion :    Trunk/Postural Assessment :    Balance:   Exercises:   Other Treatments:      Therapy/Group: Individual Therapy  Donne Gage PT, DPT, CSRS 10/03/2023, 12:15 PM

## 2023-10-03 NOTE — Progress Notes (Signed)
 Occupational Therapy Session Note  Patient Details  Name: Erica [name] MRN: [id number] Date of Birth: [birth year]  Today's Date: 10/03/2023 OT Individual Time: 8657-8469 OT Individual Time Calculation (min): 69 min  OT Individual Time: 1447-1526 OT Individual Time Calculation (min): 39 min   Short Term Goals: Week 1:  OT Short Term Goal 1 (Week 1): STG=LTG d/t ELOS  Skilled Therapeutic Interventions/Progress Updates:     AM Session: Pt received sleeping in bed, waking upon OT arrival. Pt presenting to be in good spirits receptive to skilled OT session reporting 0/10 pain- OT offering intermittent rest breaks, repositioning, and therapeutic support to optimize participation in therapy session. Pt with increased energy and motivation today vs yesterday's sessions. Pt requesting to take shower this AM- focused session on BADL retraining with emphasis on facilitating increased independence and self-efficacy to decrease overall bourdon of care. Pt transitioned to EOB supervision +increased time. Sit > stand using SPC sup. Pt completed functional mobility to bathroom using SPC with CGA provided for balance and transferred to standard toilet CGA using grab bars. Pt able to complete 3/3 toileting tasks this session with close supervision using grab bar, no LOB- continent void documented in flowsheets. Doffed clothing seated on toilet with supervision overall, assistance required to donn knee high TEDs. Ambulatory transfer to TTB positioned in walk-in shower using grab bars CGA +min verbal cues for attention and safety. Pt completed majority of shower in seated position for energy conservation, utilizing long-handled sponge to wash feet/back and standing while holding onto grab bars to wash buttocks. Pt reporting "this takes a lot of energy out of me"- OT re-educated on energy conservation techniques as they apply to bathing with emphasis on taking cooler shower, leaving bathroom door open to increase air flow,  sitting down when possible, taking breaks, and giving self increased time. Pt verbalizing understanding and receptive to education. Functional mobility to bed room using SPC CGA. Min questioning cues required to recall hemi-techniques with Pt then able to donn brief and pants with CGA and clean bra/shirt with supervision. Knee high TEDs donned total A. Engaged Pt in completing grooming/hygiene tasks in standing position at sink for increased balance and activity tolerance challenge with Pt able to complete oral care, manipulating small toiletry items without dropping with CGA. Pt was left resting EOB with call bell in reach, bed alarm on, and all needs met.    PM Session:  Pt received sitting up in bed presenting to be in good spirits receptive to skilled OT session reporting 0/10 pain- OT offering intermittent rest breaks, repositioning, and therapeutic support to optimize participation in therapy session. Focused this session on functional cognition, activity tolerance, LLE NMRE, and IADL retraining.   Engaged Pt in completing functional mobility through busy hallways to navigate ambulating in home or community environment and to facilitate NMRE to L LE for increased motor control and WB'ing. Pt able to complete >300 ft using RW with CGA with 3 seated rest breaks provided intermittently- verbal cues provided for L LE engagement and positioning with noted improvement in step length, height, and stability when Pt was cues to attention to L LE.   Pt completed functional cognition money activity to work on problem solving and selective attention. Pt tasked with determining correct amount of money required to buy Pt's desired snack from vending machine using least amount of bills and coins. Pt required increased time and min questioning cues for problem solving during task to appropriately determine amount of money and sequence  through using vending machine.   Spent time at end of session providing education  on home safety skills including fall prevention, energy conservation (EC), and AE as it relates to IADL skills. Engaged Pt in problem solving discussion with focus on kitchen safety and potential hazards- pt able to identify 3 kitchen safety skills with min questioning cues. Pt also able to apply energy conservation and EC techniques to case scenarios provided by OT with min A. When discussing laundry tasks, Pt able to verbalize safety consideration for using washer/drier, how to utilize reacher during task, and identify safe technique for transporting clean clothes to bed room. Pt demonstrating increased insight into deficits and improved safety awareness this session  Pt was left resting in recliner with call bell in reach, chair alarm on, and all needs met.    Therapy Documentation Precautions:  Precautions Precautions: Fall Recall of Precautions/Restrictions: Intact Precaution/Restrictions Comments: h/o L sided weakness from previous stroke Restrictions Weight Bearing Restrictions Per Provider Order: No   Therapy/Group: Individual Therapy  Geoffery Kiel 10/03/2023, 7:58 AM

## 2023-10-03 NOTE — Progress Notes (Signed)
 Speech Language Pathology Daily Session Note  Patient Details  Name: Erica [name] MRN: [id number] Date of Birth: 08/20/1955  Today's Date: 10/03/2023 SLP Individual Time: 0800-0859 SLP Individual Time Calculation (min): 59 min  Short Term Goals: Week 1: SLP Short Term Goal 1 (Week 1): STG = LTG due to ELOS  Skilled Therapeutic Interventions: Skilled therapy session focused on dysphagia and cognitive goals. SLP facilitated session by observing patient with regular solids, puree and thin liquids. Patient initially complaining of globus sensation with intermittent delayed throat clears, though after speaking with MD regarding swallowing no further concerns noted. Patient with mildly prolonged mastication times during regular solids and adequate oral clearance. Recommend upgrade back to regular/thin with medications whole in water. Patient should recieve full supervision for now to ensure oral clearance due to oral holding on MBS yesterday. SLP targeted cognitive goals through review of memory strategies and stroke s/sx discussed in prior sessions. Patient unable to recall memory strategies, though verbalized 3/6 stroke s/sx independently. After a 5 minute delay, patient recalled memory strategies with modA. Patient left in bed with alarm set and call bell in reach. Continue POC.  Pain None reported   Therapy/Group: Individual Therapy  Jamirra Curnow M.A., CCC-SLP 10/03/2023, 7:34 AM

## 2023-10-03 NOTE — Progress Notes (Signed)
 PROGRESS NOTE   Subjective/Complaints:  Pt c/o numbness on right side of face and throat.  Afraid to swallow, MBS showed no issues with thin liquid, bedside eval per SLP no concerning issues ROS-  Pt denies SOB, abd pain, CP, N/V/C/D, and vision changes   Objective:   DG Swallowing Func-Speech Pathology Result Date: 10/02/2023 Table formatting from the original result was not included. 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 CT head without contrast negative for acute finding. MRI revealed acute lacunar infarct in the left thalamus. Clinical Impression: Patient presents with functional oropharyngeal swallow with thin liquid PO, though unable to truly assess oropharyngeal swallow with puree consistencies. Oral phase is remarkable for occasional repetitive tongue motion, though adequate clearance of oral cavity and no labial escape. Pharyngeal swallow initiated mildly delayed to the level of the epiglottis vs pyriforms. Pharyngeal phase is remarkable for trace base of tongue residuals, and with no penetration/aspiration present. Patient with a suspected CP bar, though no other anatomical abnormalities observed. During POs of puree, patient stating she "cannot swallow" and "her throat is sore." No true lingual movement observed in attempts to swallow, therefore suspect oral holding is psychological rather than a true anatomical or physiological deficit. Per patients nurse, patient consumed medications and breakfast with no difficulty this  AM. Due to patients throat pain, she reports preference of D2 diet/thin. Recommend full supervision at this time to ensure oral clearance. DIGEST Swallow Severity Rating*  Safety: 0  Efficiency: 0  Overall Pharyngeal Swallow Severity: 0 1: mild; 2: moderate; 3: severe; 4: profound *The Dynamic Imaging Grade of Swallowing Toxicity is standardized for the head and neck cancer population, however, demonstrates promising clinical applications across populations to standardize the clinical rating of pharyngeal swallow safety and severity. Factors that may increase risk of adverse event in presence of aspiration Roderick Civatte & Jessy Morocco 2021): Factors that may increase risk of adverse event in presence of aspiration Roderick Civatte & Jessy Morocco 2021): Reduced cognitive function Recommendations/Plan: Swallowing Evaluation Recommendations Swallowing Evaluation Recommendations Recommendations: PO diet PO Diet Recommendation: Dysphagia 2 (Finely chopped); Thin liquids (Level 0) Liquid Administration via: Cup; Straw Medication Administration: Whole meds with liquid Supervision: Patient able to self-feed; Full supervision/cueing for swallowing strategies Swallowing strategies  : Minimize environmental distractions; Slow rate; Small bites/sips Postural changes: Position pt fully upright for meals Oral care recommendations: Oral care BID (2x/day) Treatment Plan Treatment Plan Treatment recommendations: Therapy as outlined in treatment plan below Follow-up recommendations: Home health SLP Recommendations Comment: n/a Functional status assessment: Patient has had a recent decline in their functional status and demonstrates the ability to make significant improvements in function in a reasonable and predictable amount of time. Treatment frequency: Min 2x/week Treatment duration: 2 weeks Interventions: Trials of upgraded texture/liquids; Patient/family education; Diet toleration management by SLP Recommendations Recommendations for follow up therapy  are one component of a multi-disciplinary discharge planning process, led by the attending physician.  Recommendations may be updated based on patient status, additional functional criteria and insurance authorization. Assessment: Orofacial Exam: Orofacial Exam Oral Cavity: Oral Hygiene: WFL Oral Cavity - Dentition: Adequate natural dentition Orofacial Anatomy: WFL Oral Motor/Sensory Function: -- (reports reduced lingual and oral sensation) Anatomy: No data recorded Boluses Administered: Boluses Administered Boluses Administered: Thin liquids (Level 0); Puree  Oral Impairment Domain: Oral Impairment Domain Lip Closure: No labial escape Tongue control during bolus hold: Cohesive bolus between tongue to palatal seal Bolus preparation/mastication: -- (none observed) Bolus transport/lingual motion: Repetitive/disorganized tongue motion Oral residue: Minimal to no clearance (oral holding of puree) Location of oral residue : Palate; Tongue Initiation of pharyngeal swallow : Valleculae; Posterior laryngeal surface of the epiglottis  Pharyngeal Impairment Domain: Pharyngeal Impairment Domain Soft palate elevation: No bolus between soft palate (SP)/pharyngeal wall (PW) Laryngeal elevation: Complete superior movement of thyroid  cartilage with complete approximation of arytenoids to epiglottic petiole Anterior hyoid excursion: Complete anterior movement Epiglottic movement: Complete inversion Laryngeal vestibule closure: Complete, no air/contrast in laryngeal vestibule Pharyngeal stripping wave : Present - complete Pharyngoesophageal segment opening: Minimal distention/minimal duration, marked obstruction of flow (CP bar) Tongue base retraction: Trace column of contrast or air between tongue base and PPW Pharyngeal residue: Complete pharyngeal clearance  Esophageal Impairment Domain: No data recorded Pill: No data recorded Penetration/Aspiration Scale Score: Penetration/Aspiration Scale Score 1.  Material does not enter  airway: Thin liquids (Level 0) Compensatory Strategies: Compensatory Strategies Compensatory strategies: No   General Information: No data recorded Diet Prior to this Study: Regular; Thin liquids (Level 0)   No data recorded  Respiratory Status: WFL   Supplemental O2: None (Room air)   No data recorded Behavior/Cognition: Alert; Requires cueing Self-Feeding Abilities: Able to self-feed Baseline vocal quality/speech: Hypophonia/low volume No data recorded No data recorded Exam Limitations: Poor participation; Poor bolus acceptance Goal Planning: Prognosis for improved oropharyngeal function: Fair Barriers to Reach Goals: Cognitive deficits; Motivation Barriers/Prognosis Comment: limited participation Patient/Family Stated Goal: none stated Consulted and agree with results and recommendations: Patient Pain: No data recorded End of Session: Start Time:No data recorded Stop Time: No data recorded Time Calculation:No data recorded Charges: No data recorded SLP visit diagnosis: SLP Visit Diagnosis: Cognitive communication deficit (R41.841); Dysphagia, unspecified (R13.10) Past Medical History: Past Medical History: Diagnosis Date  Arthritis   especially in shoulders  Asthma   COPD (chronic obstructive pulmonary disease) (HCC)   Depression   Diabetes mellitus   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  GERD (gastroesophageal reflux disease)   with chronic cough  Headache(784.0)   "mild"  Hypertension   Loop Biotronik loop implant 12/16/2020 12/16/2020  Mental disorder   depression  Neuropathy   feet   Pain   arthritis pain - takes tramadol  as needed  Pulmonary fibrosis, unspecified (HCC)   Rectal cancer (HCC)   Rectal polyp   very little bleeding with bowel movements- no pain  Stroke (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  Suicide attempt [facility])  Past Surgical History: Past Surgical History: Procedure Laterality Date  ANAL RECTAL MANOMETRY N/A 07/13/2016  Procedure: ANO RECTAL MANOMETRY;  Surgeon: Joyce Nixon, MD;  Location: [facility];  Service:  Endoscopy;  Laterality: N/A;  CESAREAN SECTION    EUS N/A 11/21/2012  Procedure: LOWER ENDOSCOPIC ULTRASOUND (EUS);  Surgeon: Evangeline Hilts, MD;  Location: [facility];  Service: Endoscopy;  Laterality: N/A;  FLEXIBLE SIGMOIDOSCOPY N/A 11/21/2012  Procedure: FLEXIBLE SIGMOIDOSCOPY;  Surgeon: Evangeline Hilts, MD;  Location:  [facility];  Service: Endoscopy;  Laterality: N/A;  FLEXIBLE SIGMOIDOSCOPY N/A 01/28/2013  Procedure: FLEXIBLE SIGMOIDOSCOPY;  Surgeon: Joyce Nixon, MD;  Location: [facility];  Service: Endoscopy;  Laterality: N/A;  LAPAROSCOPIC LOW ANTERIOR RESECTION N/A 01/29/2013  Procedure: LAPAROSCOPIC LOW ANTERIOR RESECTION, Rigid Proctoscopy;  Surgeon: Joyce Nixon, MD;  Location: [facility];  Service: General;  Laterality: N/A;  LAPAROSCOPIC SIGMOID COLECTOMY N/A 11/14/2012  Procedure: DIAGNOSTIC LAPAROSCOPY AND SIGMOIDMOIDOSCOPY ;  Surgeon: Enid Harry, MD;  Location: [facility];  Service: General;  Laterality: N/A;  RECTAL ULTRASOUND N/A 07/13/2016  Procedure: RECTAL ULTRASOUND;  Surgeon: Joyce Nixon, MD;  Location: [facility];  Service: Endoscopy;  Laterality: N/A;  TONSILLECTOMY    TONSILLECTOMY AND ADENOIDECTOMY   Cassidi F Sockwell 10/02/2023, 9:59 AM  No results for input(s): "WBC", "HGB", "HCT", "PLT" in the last 72 hours.  No results for input(s): "NA", "K", "CL", "CO2", "GLUCOSE", "BUN", "CREATININE", "CALCIUM " in the last 72 hours.   Intake/Output Summary (Last 24 hours) at 10/03/2023 0835 Last data filed at 10/02/2023 1831 Gross per 24 hour  Intake 240 ml  Output --  Net 240 ml        Physical Exam: Vital Signs Blood pressure (!) 155/78, pulse (!) 58, temperature 98.4 F (36.9 C), resp. rate 17, height 5\' 2"  (1.575 m), weight 73.5 kg, SpO2 100%.  General: No acute distress Mood and affect are appropriate Heart: Regular rate and rhythm no rubs murmurs or extra sounds Lungs: Clear to auscultation, breathing unlabored, no rales or wheezes Abdomen: Positive bowel sounds,  soft nontender to palpation, nondistended Extremities: No clubbing, cyanosis, or edema Skin: No evidence of breakdown, no evidence of rash ENT- ceruminosis Right ear canal, no erythema or tenderness with exam , TM not visualized  Neurologic: Oriented x 3, motor strength is 4/5 in bilateral deltoid, bicep, tricep, grip, hip flexor, knee extensors, ankle dorsiflexor and plantar flexor Sensory exam Reduced sensation left arm and right V2, V3 as well as temporal scalp area  Cerebellar exam Mild dysmetria Left FNF but ok on right side Musculoskeletal: Full range of motion in all 4 extremities. No joint swelling    Assessment/Plan: 1. Functional deficits which require 3+ hours per day of interdisciplinary therapy in a comprehensive inpatient rehab setting. Physiatrist is providing close team supervision and 24 hour management of active medical problems listed below. Physiatrist and rehab team continue to assess barriers to discharge/monitor patient progress toward functional and medical goals  Care Tool:  Bathing    Body parts bathed by patient: Right arm, Left arm, Chest, Abdomen, Front perineal area, Right upper leg, Left upper leg, Right lower leg, Left lower leg, Face         Bathing assist Assist Level: Moderate Assistance - Patient 50 - 74%     Upper Body Dressing/Undressing Upper body dressing   What is the patient wearing?: Pull over shirt    Upper body assist Assist Level: Supervision/Verbal cueing    Lower Body Dressing/Undressing Lower body dressing      What is the patient wearing?: Underwear/pull up, Pants     Lower body assist Assist for lower body dressing: Minimal Assistance - Patient > 75%     Toileting Toileting    Toileting assist Assist for toileting: Minimal Assistance - Patient > 75%     Transfers Chair/bed transfer  Transfers assist     Chair/bed transfer assist level: Contact Guard/Touching assist     Locomotion Ambulation   Ambulation  assist  Assist level: Contact Guard/Touching assist Assistive device: Cane-straight Max distance: 170 ft   Walk 10 feet activity   Assist     Assist level: Supervision/Verbal cueing Assistive device: Cane-straight   Walk 50 feet activity   Assist    Assist level: Contact Guard/Touching assist Assistive device: Cane-straight    Walk 150 feet activity   Assist    Assist level: Contact Guard/Touching assist Assistive device: Cane-straight    Walk 10 feet on uneven surface  activity   Assist     Assist level: Contact Guard/Touching assist Assistive device: Cane-straight   Wheelchair     Assist Is the patient using a wheelchair?: Yes Type of Wheelchair: Manual    Wheelchair assist level: Dependent - Patient 0% Max wheelchair distance: 175 ft    Wheelchair 50 feet with 2 turns activity    Assist        Assist Level: Dependent - Patient 0%   Wheelchair 150 feet activity     Assist      Assist Level: Dependent - Patient 0%   Blood pressure (!) 155/78, pulse (!) 58, temperature 98.4 F (36.9 C), resp. rate 17, height 5\' 2"  (1.575 m), weight 73.5 kg, SpO2 100%.  Medical Problem List and Plan: 1. 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  Loop recorder placed 2022             -patient may shower             -ELOS/Goals: 5-7 days, SPV PT/OT/SLP             Con't CIR  2.  Antithrombotics: -DVT/anticoagulation:  Pharmaceutical: Heparin              -antiplatelet therapy: Aspirin  81 mg daily and Plavix  75 mg day x 3 weeks then Plavix  alone 3. Pain Management: Neurontin  100 mg  daily as needed, Tylenol  as needed  4/19- added Fioricet prn for HA's 4. Mood/Behavior/Sleep: Effexor  150 mg daily, Zoloft  50 mg daily             -antipsychotic agents: N/A 5. Neuropsych/cognition: This  patient is usually capable of making decisions on her own behalf. 6. Skin/Wound Care: Routine skin checks 7. Fluids/Electrolytes/Nutrition: Routine in and outs with follow-up chemistries 8.  CKD stage III.  Creatinine baseline 1.27-2.0.  Follow-up chemistries    Latest Ref Rng & Units 09/29/2023    5:33 AM 09/28/2023    5:13 PM 09/27/2023    9:03 AM  BMP  Glucose 70 - 99 mg/dL 295   621   BUN 8 - 23 mg/dL 35   26   Creatinine 3.08 - 1.00 mg/dL 6.57  8.46  9.62   Sodium 135 - 145 mmol/L 139   140   Potassium 3.5 - 5.1 mmol/L 4.6   4.7   Chloride 98 - 111 mmol/L 112   108   CO2 22 - 32 mmol/L 19   21   Calcium  8.9 - 10.3 mg/dL 8.8   8.8    At baseline  9.  Diabetes mellitus.  Hemoglobin A1c 5.6.  Currently SSI.  Prior to admission patient on Lantus  insulin  30 units daily as well as Farxiga 10 mg daily and resume as needed  CBG (last 3)  Recent Labs    10/02/23 1637 10/02/23 2138 10/03/23 0624  GLUCAP 93 116* 96  Resume Lantus  10U at  bedtime , titrate upwards 4/19- First dose of Lantus  last night- CBGs up to 201- will let it calm down and will con't to titrate as needed 10.  COPD/history of tobacco use as well as Idiopathic Pulmonary Fibrosis.  Continue inhalers as advised as well as Pirfenidone  3 times daily.  F/u with Dr Bertrum Brodie , pulmonary  11.  Hyperlipidemia.  Crestor /Zetia  12.  Permissive hypertension.  Patient on Norvasc  10 mg daily, hydralazine  25 mg every 8 hours prior to admission.  Resume as needed Vitals:   10/02/23 2140 10/03/23 0537  BP: (!) 141/74 (!) 155/78  Pulse: 60 (!) 58  Resp: 17 17  Temp: 98.1 F (36.7 C) 98.4 F (36.9 C)  SpO2: 98% 100%   13.  History of rectal cancer.  Follow-up outpatient Dr. Maryalice Smaller 14.  GERD.  Protonix   15. Daily HA's- actually more characteristic of CVA related dysesthetic pain.  Will change gabapentin  to 100mg  TID (was on prn) , may d/c topamax   16. Insomnia  4/19- will add Trazodone  50 mg at bedtime for sleep  17.  Ceruminosis  add debrox  18.  Dysphagia- mainly poor sensation in Right side throat due to thalamic infarct Left side, will enc po intake, reassure that swallowing function is intact  LOS: 5 days A FACE TO FACE EVALUATION WAS PERFORMED  Genetta Kenning 10/03/2023, 8:35 AM

## 2023-10-04 ENCOUNTER — Encounter: Admitting: Occupational Therapy

## 2023-10-04 ENCOUNTER — Encounter

## 2023-10-04 ENCOUNTER — Ambulatory Visit: Admitting: Physical Therapy

## 2023-10-04 DIAGNOSIS — Z794 Long term (current) use of insulin: Secondary | ICD-10-CM | POA: Diagnosis not present

## 2023-10-04 DIAGNOSIS — E1149 Type 2 diabetes mellitus with other diabetic neurological complication: Secondary | ICD-10-CM | POA: Diagnosis not present

## 2023-10-04 DIAGNOSIS — I6381 Other cerebral infarction due to occlusion or stenosis of small artery: Secondary | ICD-10-CM | POA: Diagnosis not present

## 2023-10-04 LAB — GLUCOSE, CAPILLARY
Glucose-Capillary: 105 mg/dL — ABNORMAL HIGH (ref 70–99)
Glucose-Capillary: 179 mg/dL — ABNORMAL HIGH (ref 70–99)
Glucose-Capillary: 122 mg/dL — ABNORMAL HIGH (ref 70–99)
Glucose-Capillary: 203 mg/dL — ABNORMAL HIGH (ref 70–99)

## 2023-10-04 MED ORDER — TRAZODONE HCL 50 MG PO TABS: 50.0000 mg | ORAL_TABLET | Freq: Every evening | ORAL | Status: DC | PRN

## 2023-10-04 MED ORDER — MELATONIN 3 MG PO TABS
3.0000 mg | ORAL_TABLET | Freq: Every day | ORAL | Status: DC
  Administered 2023-10-04 – 2023-10-09 (×6): 3 mg via ORAL
  Filled 2023-10-04 (×6): qty 1

## 2023-10-04 NOTE — Progress Notes (Signed)
 Occupational Therapy Session Note  Patient Details  Name: Erica [name] MRN: [id number] Date of Birth: February 17, 1956  Today's Date: 10/04/2023 OT Individual Time: 0730-0800 OT Individual Time Calculation (min): 30 min    Short Term Goals: Week 1:  OT Short Term Goal 1 (Week 1): STG=LTG d/t ELOS   Skilled Therapeutic Interventions/Progress Updates:    Pt bed level at time of session, no pain reported. Focus of session on morning ADL, bed mobility with Supervision and ambulated to/from bathroom with SPC and CGA including toilet transfer. Able to change brief and don pants back on with Supervision, 3/3 toileting tasks with Supervision. Pt performing seated oral hygiene and grooming supervision before walking back to bed level for breakfast. OT providing Supervision during first portion of breakfast as pt is full supervision. Hand off to NT for breakfast Supervision.   Therapy Documentation Precautions:  Precautions Precautions: Fall Recall of Precautions/Restrictions: Intact Precaution/Restrictions Comments: h/o L sided weakness from previous stroke Restrictions Weight Bearing Restrictions Per Provider Order: No    Therapy/Group: Individual Therapy  Doroteo Gasmen 10/04/2023, 7:24 AM

## 2023-10-04 NOTE — Progress Notes (Signed)
 Speech Language Pathology Daily Session Note  Patient Details  Name: Erica [name] MRN: [id number] Date of Birth: 27-Jun-1955  Today's Date: 10/04/2023 SLP Individual Time: 0905-1000 SLP Individual Time Calculation (min): 55 min  Short Term Goals: Week 1: SLP Short Term Goal 1 (Week 1): STG = LTG due to ELOS  Skilled Therapeutic Interventions: Skilled therapy session focused on dysphagia and cognitive goals. SLP facilitated session by observing patient during consumption of regular/thin liquids. Patient continues to report numbness on lingual surface, though has adequate mastication times, oral clearance and no s/sx of aspiration. Recommend continuation of current diet and medication administration. SLP targeted cognitive goals through visual safety task. Patient required supervision A to identify problem/solution in safety scenario photos. SLP continued to challenge patient through compare/contrast task in which patient required minA. Patient left in bed with alarm set and call bell in reach. Continue POC.  Pain None reported   Therapy/Group: Individual Therapy  Letti Towell M.A., CCC-SLP 10/04/2023, 7:38 AM

## 2023-10-04 NOTE — Progress Notes (Signed)
 Recreational Therapy Session Note  Patient Details  Name: Erica [name] MRN: [id number] Date of Birth: 06/28/1955 Today's Date: 10/04/2023  Pain:no c/o  Pt participated in animal assisted activity seated with supervision.  Pt easily engaged with pet partner team and was appreciative of this visit.    Natelie Ostrosky 10/04/2023, 3:31 PM

## 2023-10-04 NOTE — Progress Notes (Addendum)
 PROGRESS NOTE   Subjective/Complaints:  Pt states she feels like her throat numbness might be improving a little  Working with PT , discussed potential D/C date ROS-  Pt denies SOB, abd pain, CP, N/V/C/D, and vision changes   Objective:   DG Swallowing Func-Speech Pathology Result Date: 10/02/2023 Table formatting from the original result was not included. 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 CT head without contrast negative for acute finding. MRI revealed acute lacunar infarct in the left thalamus. Clinical Impression: Patient presents with functional oropharyngeal swallow with thin liquid PO, though unable to truly assess oropharyngeal swallow with puree consistencies. Oral phase is remarkable for occasional repetitive tongue motion, though adequate clearance of oral cavity and no labial escape. Pharyngeal swallow initiated mildly delayed to the level of the epiglottis vs pyriforms. Pharyngeal phase is remarkable for trace base of tongue residuals, and with no penetration/aspiration present. Patient with a suspected CP bar, though no other anatomical abnormalities observed. During POs of puree, patient stating she "cannot swallow" and "her throat is sore." No true lingual movement observed in attempts to swallow, therefore suspect oral holding is psychological rather than a true anatomical or physiological deficit. Per patients nurse, patient consumed medications and breakfast with no difficulty this AM. Due to patients throat  pain, she reports preference of D2 diet/thin. Recommend full supervision at this time to ensure oral clearance. DIGEST Swallow Severity Rating*  Safety: 0  Efficiency: 0  Overall Pharyngeal Swallow Severity: 0 1: mild; 2: moderate; 3: severe; 4: profound *The Dynamic Imaging Grade of Swallowing Toxicity is standardized for the head and neck cancer population, however, demonstrates promising clinical applications across populations to standardize the clinical rating of pharyngeal swallow safety and severity. Factors that may increase risk of adverse event in presence of aspiration Roderick Civatte & Jessy Morocco 2021): Factors that may increase risk of adverse event in presence of aspiration Roderick Civatte & Jessy Morocco 2021): Reduced cognitive function Recommendations/Plan: Swallowing Evaluation Recommendations Swallowing Evaluation Recommendations Recommendations: PO diet PO Diet Recommendation: Dysphagia 2 (Finely chopped); Thin liquids (Level 0) Liquid Administration via: Cup; Straw Medication Administration: Whole meds with liquid Supervision: Patient able to self-feed; Full supervision/cueing for swallowing strategies Swallowing strategies  : Minimize environmental distractions; Slow rate; Small bites/sips Postural changes: Position pt fully upright for meals Oral care recommendations: Oral care BID (2x/day) Treatment Plan Treatment Plan Treatment recommendations: Therapy as outlined in treatment plan below Follow-up recommendations: Home health SLP Recommendations Comment: n/a Functional status assessment: Patient has had a recent decline in their functional status and demonstrates the ability to make significant improvements in function in a reasonable and predictable amount of time. Treatment frequency: Min 2x/week Treatment duration: 2 weeks Interventions: Trials of upgraded texture/liquids; Patient/family education; Diet toleration management by SLP Recommendations Recommendations for follow up therapy are one component of a  multi-disciplinary discharge planning process, led by the attending physician.  Recommendations may be updated based on patient status, additional functional criteria and insurance authorization. Assessment: Orofacial Exam: Orofacial Exam Oral Cavity: Oral Hygiene: WFL Oral Cavity - Dentition: Adequate natural dentition Orofacial Anatomy: WFL Oral Motor/Sensory Function: -- (reports reduced lingual and oral sensation) Anatomy: No data recorded Boluses Administered: Boluses Administered Boluses Administered: Thin liquids (Level 0); Puree  Oral Impairment Domain: Oral Impairment Domain Lip Closure: No labial escape Tongue control during bolus hold: Cohesive bolus between tongue to palatal seal Bolus preparation/mastication: -- (none observed) Bolus transport/lingual motion: Repetitive/disorganized tongue motion Oral residue: Minimal to no clearance (oral holding of puree) Location of oral residue : Palate; Tongue Initiation of pharyngeal swallow : Valleculae; Posterior laryngeal surface of the epiglottis  Pharyngeal Impairment Domain: Pharyngeal Impairment Domain Soft palate elevation: No bolus between soft palate (SP)/pharyngeal wall (PW) Laryngeal elevation: Complete superior movement of thyroid  cartilage with complete approximation of arytenoids to epiglottic petiole Anterior hyoid excursion: Complete anterior movement Epiglottic movement: Complete inversion Laryngeal vestibule closure: Complete, no air/contrast in laryngeal vestibule Pharyngeal stripping wave : Present - complete Pharyngoesophageal segment opening: Minimal distention/minimal duration, marked obstruction of flow (CP bar) Tongue base retraction: Trace column of contrast or air between tongue base and PPW Pharyngeal residue: Complete pharyngeal clearance  Esophageal Impairment Domain: No data recorded Pill: No data recorded Penetration/Aspiration Scale Score: Penetration/Aspiration Scale Score 1.  Material does not enter airway: Thin liquids (Level  0) Compensatory Strategies: Compensatory Strategies Compensatory strategies: No   General Information: No data recorded Diet Prior to this Study: Regular; Thin liquids (Level 0)   No data recorded  Respiratory Status: WFL   Supplemental O2: None (Room air)   No data recorded Behavior/Cognition: Alert; Requires cueing Self-Feeding Abilities: Able to self-feed Baseline vocal quality/speech: Hypophonia/low volume No data recorded No data recorded Exam Limitations: Poor participation; Poor bolus acceptance Goal Planning: Prognosis for improved oropharyngeal function: Fair Barriers to Reach Goals: Cognitive deficits; Motivation Barriers/Prognosis Comment: limited participation Patient/Family Stated Goal: none stated Consulted and agree with results and recommendations: Patient Pain: No data recorded End of Session: Start Time:No data recorded Stop Time: No data recorded Time Calculation:No data recorded Charges: No data recorded SLP visit diagnosis: SLP Visit Diagnosis: Cognitive communication deficit (R41.841); Dysphagia, unspecified (R13.10) Past Medical History: Past Medical History: Diagnosis Date  Arthritis   especially in shoulders  Asthma   COPD (chronic obstructive pulmonary disease) (HCC)   Depression   Diabetes mellitus   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  GERD (gastroesophageal reflux disease)   with chronic cough  Headache(784.0)   "mild"  Hypertension   Loop Biotronik loop implant 12/16/2020 12/16/2020  Mental disorder   depression  Neuropathy   feet   Pain   arthritis pain - takes tramadol  as needed  Pulmonary fibrosis, unspecified (HCC)   Rectal cancer (HCC)   Rectal polyp   very little bleeding with bowel movements- no pain  Stroke (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  Suicide attempt [facility])  Past Surgical History: Past Surgical History: Procedure Laterality Date  ANAL RECTAL MANOMETRY N/A 07/13/2016  Procedure: ANO RECTAL MANOMETRY;  Surgeon: Joyce Nixon, MD;  Location: [facility];  Service: Endoscopy;  Laterality: N/A;   CESAREAN SECTION    EUS N/A 11/21/2012  Procedure: LOWER ENDOSCOPIC ULTRASOUND (EUS);  Surgeon: Evangeline Hilts, MD;  Location: [facility];  Service: Endoscopy;  Laterality: N/A;  FLEXIBLE SIGMOIDOSCOPY N/A 11/21/2012  Procedure: FLEXIBLE SIGMOIDOSCOPY;  Surgeon: Evangeline Hilts, MD;  Location: [facility];  Service: Endoscopy;  Laterality: N/A;  FLEXIBLE SIGMOIDOSCOPY N/A 01/28/2013  Procedure: FLEXIBLE SIGMOIDOSCOPY;  Surgeon: Joyce Nixon, MD;  Location: [facility];  Service: Endoscopy;  Laterality: N/A;  LAPAROSCOPIC LOW ANTERIOR RESECTION N/A 01/29/2013  Procedure: LAPAROSCOPIC LOW ANTERIOR RESECTION, Rigid Proctoscopy;  Surgeon: Joyce Nixon, MD;  Location: [facility];  Service: General;  Laterality: N/A;  LAPAROSCOPIC SIGMOID COLECTOMY N/A 11/14/2012  Procedure: DIAGNOSTIC LAPAROSCOPY AND SIGMOIDMOIDOSCOPY ;  Surgeon: Enid Harry, MD;  Location: [facility];  Service: General;  Laterality: N/A;  RECTAL ULTRASOUND N/A 07/13/2016  Procedure: RECTAL ULTRASOUND;  Surgeon: Joyce Nixon, MD;  Location: [facility];  Service: Endoscopy;  Laterality: N/A;  TONSILLECTOMY    TONSILLECTOMY AND ADENOIDECTOMY   Cassidi F Sockwell 10/02/2023, 9:59 AM  No results for input(s): "WBC", "HGB", "HCT", "PLT" in the last 72 hours.  No results for input(s): "NA", "K", "CL", "CO2", "GLUCOSE", "BUN", "CREATININE", "CALCIUM " in the last 72 hours.   Intake/Output Summary (Last 24 hours) at 10/04/2023 0826 Last data filed at 10/03/2023 1800 Gross per 24 hour  Intake 306 ml  Output --  Net 306 ml        Physical Exam: Vital Signs Blood pressure (!) 156/88, pulse 66, temperature 97.6 F (36.4 C), resp. rate 17, height 5\' 2"  (1.575 m), weight 73.5 kg, SpO2 100%.  General: No acute distress Mood and affect are appropriate Heart: Regular rate and rhythm no rubs murmurs or extra sounds Lungs: Clear to auscultation, breathing unlabored, no rales or wheezes Abdomen: Positive bowel sounds, soft nontender to palpation,  nondistended Extremities: No clubbing, cyanosis, or edema Skin: No evidence of breakdown, no evidence of rash ENT- ceruminosis Right ear canal, no erythema or tenderness with exam , TM not visualized  Neurologic: Oriented x 3, motor strength is 4/5 in bilateral deltoid, bicep, tricep, grip, hip flexor, knee extensors, ankle dorsiflexor and plantar flexor Sensory exam Reduced sensation left arm and right V2, V3 as well as temporal scalp area  Cerebellar exam Mild dysmetria FNF bilateral  Musculoskeletal: Full range of motion in all 4 extremities. No joint swelling  Assessment/Plan: 1. Functional deficits which require 3+ hours per day of interdisciplinary therapy in a comprehensive inpatient rehab setting. Physiatrist is providing close team supervision and 24 hour management of active medical problems listed below. Physiatrist and rehab team continue to assess barriers to discharge/monitor patient progress toward functional and medical goals  Care Tool:  Bathing    Body parts bathed by patient: Right arm, Left arm, Chest, Abdomen, Front perineal area, Right upper leg, Left upper leg, Right lower leg, Left lower leg, Face         Bathing assist Assist Level: Moderate Assistance - Patient 50 - 74%     Upper Body Dressing/Undressing Upper body dressing   What is the patient wearing?: Pull over shirt    Upper body assist Assist Level: Supervision/Verbal cueing    Lower Body Dressing/Undressing Lower body dressing      What is the patient wearing?: Underwear/pull up, Pants     Lower body assist Assist for lower body dressing: Minimal Assistance - Patient > 75%     Toileting Toileting    Toileting assist Assist for toileting: Minimal Assistance - Patient > 75%     Transfers Chair/bed transfer  Transfers assist     Chair/bed transfer assist level: Contact Guard/Touching assist     Locomotion Ambulation   Ambulation assist      Assist level: Contact  Guard/Touching assist Assistive device: Cane-straight Max distance: 170 ft  Walk 10 feet activity   Assist     Assist level: Supervision/Verbal cueing Assistive device: Cane-straight   Walk 50 feet activity   Assist    Assist level: Contact Guard/Touching assist Assistive device: Cane-straight    Walk 150 feet activity   Assist    Assist level: Contact Guard/Touching assist Assistive device: Cane-straight    Walk 10 feet on uneven surface  activity   Assist     Assist level: Contact Guard/Touching assist Assistive device: Cane-straight   Wheelchair     Assist Is the patient using a wheelchair?: Yes Type of Wheelchair: Manual    Wheelchair assist level: Dependent - Patient 0% Max wheelchair distance: 175 ft    Wheelchair 50 feet with 2 turns activity    Assist        Assist Level: Dependent - Patient 0%   Wheelchair 150 feet activity     Assist      Assist Level: Dependent - Patient 0%   Blood pressure (!) 156/88, pulse 66, temperature 97.6 F (36.4 C), resp. rate 17, height 5\' 2"  (1.575 m), weight 73.5 kg, SpO2 100%.  Medical Problem List and Plan: 1. 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  Loop recorder placed 2022             -patient may shower             -ELOS/Goals: 7-10 days, SPV PT/OT/SLP             Con't CIR  Team conference today please see physician documentation under team conference tab, met with team  to discuss problems,progress, and goals. Formulized individual treatment plan based on medical history, underlying problem and comorbidities.  2.  Antithrombotics: -DVT/anticoagulation:  Pharmaceutical: Heparin  may d/c amb >34ft             -antiplatelet therapy: Aspirin  81 mg daily and Plavix  75 mg day x 3 weeks then Plavix  alone 3. Pain Management:  Neurontin  100 mg  daily as needed, Tylenol  as needed  4/19- added Fioricet prn for HA's 4. Mood/Behavior/Sleep: Effexor  150 mg daily, Zoloft  50 mg daily             -antipsychotic agents: N/A 5. Neuropsych/cognition: This patient is usually capable of making decisions on her own behalf. 6. Skin/Wound Care: Routine skin checks 7. Fluids/Electrolytes/Nutrition: Routine in and outs with follow-up chemistries 8.  CKD stage III.  Creatinine baseline 1.27-2.0.  Follow-up chemistries    Latest Ref Rng & Units 09/29/2023    5:33 AM 09/28/2023    5:13 PM 09/27/2023    9:03 AM  BMP  Glucose 70 - 99 mg/dL 604   540   BUN 8 - 23 mg/dL 35   26   Creatinine 9.81 - 1.00 mg/dL 1.91  4.78  2.95   Sodium 135 - 145 mmol/L 139   140   Potassium 3.5 - 5.1 mmol/L 4.6   4.7   Chloride 98 - 111 mmol/L 112   108   CO2 22 - 32 mmol/L 19   21   Calcium  8.9 - 10.3 mg/dL 8.8   8.8    At baseline  9.  Diabetes mellitus.  Hemoglobin A1c 5.6.  Currently SSI.  Prior to admission patient on Lantus  insulin  30 units daily as well as Farxiga 10 mg daily and resume as needed  CBG (  last 3)  Recent Labs    10/03/23 1630 10/03/23 2120 10/04/23 0625  GLUCAP 138* 185* 105*  Resume Lantus  10U at bedtime , controlled 4/23 10.  COPD/history of tobacco use as well as Idiopathic Pulmonary Fibrosis.  Continue inhalers as advised as well as Pirfenidone  3 times daily.  F/u with Dr Bertrum Brodie , pulmonary  11.  Hyperlipidemia.  Crestor /Zetia  12.  Permissive hypertension.  Patient on Norvasc  10 mg daily, hydralazine  25 mg every 8 hours prior to admission.  Resume as needed Vitals:   10/03/23 2001 10/04/23 0359  BP: 124/71 (!) 156/88  Pulse: 72 66  Resp: 17 17  Temp: 97.9 F (36.6 C) 97.6 F (36.4 C)  SpO2: 100% 100%  On amlodipine  5mg  per day 13.  History of rectal cancer.  Follow-up outpatient Dr. Maryalice Smaller 14.  GERD.  Protonix   15. Daily HA's- actually more characteristic of CVA related dysesthetic pain.  Will change gabapentin   to 100mg  TID (was on prn) , may d/c topamax   16. Insomnia  4/23-overall improved Trazodone  50 mg at bedtime for sleep  17.  Ceruminosis completed debrox  18.  Dysphagia- mainly poor sensation in Right side throat due to thalamic infarct Left side, will enc po intake, reassure that swallowing function is intact  LOS: 6 days A FACE TO FACE EVALUATION WAS PERFORMED  Genetta Kenning 10/04/2023, 8:26 AM

## 2023-10-04 NOTE — Progress Notes (Signed)
 Patient ID: Erica [name], female   DOB: 1956-05-01, 68 y.o.   MRN: [id number]  Met with pt and message left for sister-[name] regarding team conference goals of mod/I supervision for bathing and dressing and target discharge date of 4/29. Pt is doing well and feels making progress in her therapies. Discussed follow up and going back to OP and will talk with sister regarding this. Pt reports she is visiting today will try to see her then

## 2023-10-04 NOTE — Progress Notes (Signed)
 Occupational Therapy Session Note  Patient Details  Name: Erica [name] MRN: [id number] Date of Birth: Jun 17, 1955  Today's Date: 10/04/2023 OT Individual Time: 1300-1408 OT Individual Time Calculation (min): 68 min    Short Term Goals: Week 1:  OT Short Term Goal 1 (Week 1): STG=LTG d/t ELOS  Skilled Therapeutic Interventions/Progress Updates:     Pt received sitting up in recliner finishing her lunch. Pt presenting to be in good spirits receptive to skilled OT session reporting 0/10 pain- OT offering intermittent rest breaks, repositioning, and therapeutic support to optimize participation in therapy session. Focused this session on activity tolerance, community reintegration, functional cognition, and Pt education. Pt requesting to go outside today- completed portion of session in outdoor location to allow for change in environment. Engaged Pt in completing simulated community navigation tasks utilizing signs in hospital to navigate to specified location to work on functional cognition, attention, and awareness skills. Pt able to complete activity from wc level with min questioning cues required to locate signs in hospital. While outdoors, engaged Pt in completing simulated  community mobility activities with focus on safe community mobility, identification & negotiation of obstacles, and energy conservation techniques/education. Pt tasked with navigating across uneven terrain, around obstacles, and on/off park benches/tables using SPC with CGA required overall for balance +increased time d/t slowed gait speed. Pt able appropriately request rest breaks and notices when she was beginning to drag her R LE when becoming fatigued demonstrating improved insight into her deficits. At end of session, Pt able to recall location of room and navigate back to room utilizing signs with min questioning cues. Pt requesting to return to bed. EOB > supine CGA. Pt was left resting in bed with call bell in reach, bed  alarm on, and all needs met.    Therapy Documentation Precautions:  Precautions Precautions: Fall Recall of Precautions/Restrictions: Intact Precaution/Restrictions Comments: h/o L sided weakness from previous stroke Restrictions Weight Bearing Restrictions Per Provider Order: No   Therapy/Group: Individual Therapy  Geoffery Kiel 10/04/2023, 8:00 AM

## 2023-10-04 NOTE — Progress Notes (Signed)
 Physical Therapy Session Note  Patient Details  Name: Erica [name] MRN: [id number] Date of Birth: 11/21/1955  Today's Date: 10/04/2023 PT Individual Time: 0803-0902 PT Individual Time Calculation (min): 59 min   Short Term Goals: Week 1:  PT Short Term Goal 1 (Week 1): STG = LTG d/t ELOS  Skilled Therapeutic Interventions/Progress Updates:  Patient seated on EOB on entrance to room. Patient alert and agreeable to PT session. Received breakfast late and is completing breakfast. Discussed pt's d/c plan and potential for later stay/ discharge date in order to address minor setback from previous weekend where pt could not participate in therapy sessions and did not maintain mobility which potentially affected her slight regression this week. She does relate that son has a busy life and can't always spend as much time with her.   Son then calls and relates to this therapist that he is also in school so his time is very limited but tries to get to see mother when he can and also orders her groceries for delivery for her. He relates that there has been a family member who has been able to assist pt for a few hrs/ day for a few days in the week. But this person is not able to continue assisting pt and he would like to establish [facility] aides to come and assist mother. He is also willing to come for family education and bring other family as well. Related potential for need to come in on Monday but that after team conference today, CSW will call him to schedule.   Patient with no pain complaint at start of session.  Therapeutic Activity: Transfers: Pt performed sit<>stand and stand pivot transfers throughout session with difficulty initially from more pliant bed surface but improves throughout session from various surfaces and seats. Provided vc/ tc for forward lean and hand placement to seat in order to push-to-stand.   Gait Training:  Pt ambulated 150' x1/ 140' x1 using RW for improved steadiness. In first  bout, pt demos increased weakness in LLE with increased knee flexion during stance phase. She reaches out intermittently for handrail with LUE. Completes both distances with close supervision/ CGA and provided with vc for need to increase focus to LLE and maintaining strength in knee extension during stance phase.   Neuromuscular Re-ed: NMR facilitated during session with focus on motor control. Pt guided in continuous reciprocation of BUE and BLE using NuStep L3x61min/ L4x7min with focus on BLE use and control of BLE in order to maintain coordinated reciprocation. Pt bottoms out each step despite vc to reverse push/ pull prior to reaching end of push into extension. Coordination improves slightly with addition of BUE. Is able to maintain 1.6 METs throughout.   NMR performed for improvements in motor control and coordination, balance, sequencing, judgement, and self confidence/ efficacy in performing all aspects of mobility at highest level of independence.   Patient seated upright on EOB at end of session with brakes locked, bed alarm set, and all needs within reach. ST in room and taking over care for speech therapy session.    Therapy Documentation Precautions:  Precautions Precautions: Fall Recall of Precautions/Restrictions: Intact Precaution/Restrictions Comments: h/o L sided weakness from previous stroke Restrictions Weight Bearing Restrictions Per Provider Order: No  Pain:  No pain related during session.   Therapy/Group: Individual Therapy  Donne Gage PT, DPT, CSRS 10/04/2023, 8:11 AM

## 2023-10-04 NOTE — Patient Care Conference (Signed)
 Inpatient RehabilitationTeam Conference and Plan of Care Update Date: 10/04/2023   Time: 10:34 AM    Patient Name: Erica [name]      Medical Record Number: [id number]  Date of Birth: 02/01/1956 Sex: Female         Room/Bed: 4M06C/4M06C-01 Payor Info: Payor: Advertising copywriter MEDICARE / Plan: Sandford Croon DUAL COMPLETE / Product Type: *No Product type* /    Admit Date/Time:  09/28/2023  4:04 PM  Primary Diagnosis:  Left thalamic infarction [facility])  Hospital Problems: Principal Problem:   Left thalamic infarction [facility])    Expected Discharge Date: Expected Discharge Date: 10/10/23  Team Members Present: Physician leading conference: Dr. Janeece Mechanic Social Worker Present: Adrianna Albee, LCSW Nurse Present: Forrestine Ike, RN PT Present: Oma Bias, PT OT Present: Julia Saguier, OT SLP Present: Reggie Caper, SLP     Current Status/Progress Goal Weekly Team Focus  Bowel/Bladder   Pt is continent of bowel/bladder   Pt to remain continent of bowel/bladder   Will assess qshift and PRN    Swallow/Nutrition/ Hydration   poor participation on MBS, seemingly functional oropharyngeal swallow - regular/thin   supervision  tolerance and education regarding safe swallowing strategies    ADL's   UB bathing/dressing supervision, LB bathing/dressing CGA, toileting CGA, ambulatory toilet/shower transfer CGA using SPC & grab bars // Barriers: fear of falling, anxiety related to being left alone for portion of the day, intermittently self limiting (Pt's energy levels fluctuate day to day), dynamic balance   mod I overall for BADLs except supervision for  U/LB bathing and shower transfers   dynamic balance, thearpeutic use of self, Pt education, BADL retraining, activity tolerance, functional cognition, general strengthing, activity tolerance    Mobility   Bed mobility = supervision/ Mod I, Transfers = CGA/ supervision, Ambulation = CGA using RW or SPC.   Mod I to supervision   Barriers: L sided weakness, low activity tolerance, recent death of close family friend /// Work on: L hemibody NMR and strengthening, improving activity tolerance, standing balance and protective/ reactive balance measures, family education    Communication   occasional neurogenic stutter during conversation   supervision A   education regarding strategies and use    Safety/Cognition/ Behavioral Observations  min-modA   minA   problem solving, STM    Pain   Pt denies pain   Pt will continue to deny pain   Will assess qshift and PRN    Skin   Pt's skin is intact   Pt's skin will maintain its integrity  Will assess qshift and PRN      Discharge Planning:  HOme with sister, son and PCS providing assist, there will be hours she will be alone and was prior to admission. Was going to OP therapies PTA   Team Discussion: Patient post left thalamic infarct with typical thalamic stroke symptoms; and awareness of numbness in face and dysesthetic pain in left ear. Limited by decreased activity intolerance and depression.   Patient on target to meet rehab goals: yes, currently needs CGA for toileting  *See Care Plan and progress notes for long and short-term goals.   Revisions to Treatment Plan:  Upgraded SLP goals   Teaching Needs: Safety, medications, dietary modification, transfers, toileting, etc.   Current Barriers to Discharge: Decreased caregiver support and was receiving OP services PTA  with an aide  Possible Resolutions to Barriers: Family education OP follow up services     Medical Summary Current Status: swallowing issues due to numbness  in throat,  MBBS results ok for thin liquid  Barriers to Discharge: Other (comments);Uncontrolled Hypertension  Barriers to Discharge Comments: fear of swallowing due to sensory deficits related to CVA Possible Resolutions to Barriers/Weekly Focus: medication adjustments for BP and DM, work on intake and swallowing per  SLP   Continued Need for Acute Rehabilitation Level of Care: The patient requires daily medical management by a physician with specialized training in physical medicine and rehabilitation for the following reasons: Direction of a multidisciplinary physical rehabilitation program to maximize functional independence : Yes Medical management of patient stability for increased activity during participation in an intensive rehabilitation regime.: Yes Analysis of laboratory values and/or radiology reports with any subsequent need for medication adjustment and/or medical intervention. : Yes   I attest that I was present, lead the team conference, and concur with the assessment and plan of the team.   Forrestine Ike B 10/04/2023, 3:13 PM

## 2023-10-05 DIAGNOSIS — E1149 Type 2 diabetes mellitus with other diabetic neurological complication: Secondary | ICD-10-CM | POA: Diagnosis not present

## 2023-10-05 DIAGNOSIS — Z794 Long term (current) use of insulin: Secondary | ICD-10-CM | POA: Diagnosis not present

## 2023-10-05 DIAGNOSIS — I6381 Other cerebral infarction due to occlusion or stenosis of small artery: Secondary | ICD-10-CM | POA: Diagnosis not present

## 2023-10-05 LAB — GLUCOSE, CAPILLARY
Glucose-Capillary: 125 mg/dL — ABNORMAL HIGH (ref 70–99)
Glucose-Capillary: 95 mg/dL (ref 70–99)
Glucose-Capillary: 166 mg/dL — ABNORMAL HIGH (ref 70–99)
Glucose-Capillary: 172 mg/dL — ABNORMAL HIGH (ref 70–99)

## 2023-10-05 MED ORDER — AMLODIPINE BESYLATE 10 MG PO TABS
10.0000 mg | ORAL_TABLET | Freq: Every day | ORAL | Status: DC
  Administered 2023-10-06 – 2023-10-10 (×5): 10 mg via ORAL
  Filled 2023-10-05 (×5): qty 1

## 2023-10-05 MED ORDER — ACETAMINOPHEN 325 MG PO TABS: 650.0000 mg | ORAL_TABLET | ORAL | Status: DC | PRN

## 2023-10-05 NOTE — Progress Notes (Signed)
 PROGRESS NOTE   Subjective/Complaints:  Discussed d/c date, pt satisfied with date, discussed BP meds as well as stroke prevention with Plavix   ROS-  Pt denies SOB, abd pain, CP, N/V/C/D, and vision changes   Objective:   No results found.  No results for input(s): "WBC", "HGB", "HCT", "PLT" in the last 72 hours.  No results for input(s): "NA", "K", "CL", "CO2", "GLUCOSE", "BUN", "CREATININE", "CALCIUM " in the last 72 hours.   Intake/Output Summary (Last 24 hours) at 10/05/2023 0823 Last data filed at 10/05/2023 0815 Gross per 24 hour  Intake 548 ml  Output --  Net 548 ml        Physical Exam: Vital Signs Blood pressure (!) 153/81, pulse (!) 58, temperature 97.6 F (36.4 C), temperature source Oral, resp. rate 17, height 5\' 2"  (1.575 m), weight 73.5 kg, SpO2 100%.  General: No acute distress Mood and affect are appropriate Heart: Regular rate and rhythm no rubs murmurs or extra sounds Lungs: Clear to auscultation, breathing unlabored, no rales or wheezes Abdomen: Positive bowel sounds, soft nontender to palpation, nondistended Extremities: No clubbing, cyanosis, or edema Skin: No evidence of breakdown, no evidence of rash ENT- ceruminosis Right ear canal, no erythema or tenderness with exam , TM not visualized  Neurologic: Oriented x 3, motor strength is 4/5 in bilateral deltoid, bicep, tricep, grip, hip flexor, knee extensors, ankle dorsiflexor and plantar flexor Sensory exam Reduced sensation left arm and right V2, V3 as well as temporal scalp area  Cerebellar exam Mild dysmetria FNF bilateral  Musculoskeletal: Full range of motion in all 4 extremities. No joint swelling  Assessment/Plan: 1. Functional deficits which require 3+ hours per day of interdisciplinary therapy in a comprehensive inpatient rehab setting. Physiatrist is providing close team supervision and 24 hour management of active medical problems  listed below. Physiatrist and rehab team continue to assess barriers to discharge/monitor patient progress toward functional and medical goals  Care Tool:  Bathing    Body parts bathed by patient: Right arm, Left arm, Chest, Abdomen, Front perineal area, Right upper leg, Left upper leg, Right lower leg, Left lower leg, Face         Bathing assist Assist Level: Moderate Assistance - Patient 50 - 74%     Upper Body Dressing/Undressing Upper body dressing   What is the patient wearing?: Pull over shirt    Upper body assist Assist Level: Supervision/Verbal cueing    Lower Body Dressing/Undressing Lower body dressing      What is the patient wearing?: Underwear/pull up, Pants     Lower body assist Assist for lower body dressing: Minimal Assistance - Patient > 75%     Toileting Toileting    Toileting assist Assist for toileting: Supervision/Verbal cueing     Transfers Chair/bed transfer  Transfers assist     Chair/bed transfer assist level: Contact Guard/Touching assist     Locomotion Ambulation   Ambulation assist      Assist level: Contact Guard/Touching assist Assistive device: Cane-straight Max distance: 170 ft   Walk 10 feet activity   Assist     Assist level: Supervision/Verbal cueing Assistive device: Cane-straight   Walk 50 feet activity  Assist    Assist level: Contact Guard/Touching assist Assistive device: Cane-straight    Walk 150 feet activity   Assist    Assist level: Contact Guard/Touching assist Assistive device: Cane-straight    Walk 10 feet on uneven surface  activity   Assist     Assist level: Contact Guard/Touching assist Assistive device: Cane-straight   Wheelchair     Assist Is the patient using a wheelchair?: Yes Type of Wheelchair: Manual    Wheelchair assist level: Dependent - Patient 0% Max wheelchair distance: 175 ft    Wheelchair 50 feet with 2 turns activity    Assist         Assist Level: Dependent - Patient 0%   Wheelchair 150 feet activity     Assist      Assist Level: Dependent - Patient 0%   Blood pressure (!) 153/81, pulse (!) 58, temperature 97.6 F (36.4 C), temperature source Oral, resp. rate 17, height 5\' 2"  (1.575 m), weight 73.5 kg, SpO2 100%.  Medical Problem List and Plan: 1. 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  Loop recorder placed 2022             -patient may shower             -ELOS/Goals: 10/10/2023 Sup / mod I goals 2.  Antithrombotics: -DVT/anticoagulation:  Pharmaceutical: Heparin  may d/c amb >332ft             -antiplatelet therapy: Aspirin  81 mg daily and Plavix  75 mg day x 3 weeks then Plavix  alone 3. Pain Management: Neurontin  100 mg  daily as needed, Tylenol  as needed  4/19- added Fioricet prn for HA's 4. Mood/Behavior/Sleep: Effexor  150 mg daily, Zoloft  50 mg daily             -antipsychotic agents: N/A 5. Neuropsych/cognition: This patient is usually capable of making decisions on her own behalf. 6. Skin/Wound Care: Routine skin checks 7. Fluids/Electrolytes/Nutrition: Routine in and outs with follow-up chemistries 8.  CKD stage III.  Creatinine baseline 1.27-2.0.  Follow-up chemistries    Latest Ref Rng & Units 09/29/2023    5:33 AM 09/28/2023    5:13 PM 09/27/2023    9:03 AM  BMP  Glucose 70 - 99 mg/dL 098   119   BUN 8 - 23 mg/dL 35   26   Creatinine 1.47 - 1.00 mg/dL 8.29  5.62  1.30   Sodium 135 - 145 mmol/L 139   140   Potassium 3.5 - 5.1 mmol/L 4.6   4.7   Chloride 98 - 111 mmol/L 112   108   CO2 22 - 32 mmol/L 19   21   Calcium  8.9 - 10.3 mg/dL 8.8   8.8    At baseline  9.  Diabetes mellitus.  Hemoglobin A1c 5.6.  Currently SSI.  Prior to admission patient on Lantus  insulin  30 units daily as well as Farxiga 10 mg daily and resume as needed   CBG (last 3)  Recent Labs    10/04/23 1627 10/04/23 2059 10/05/23 0608  GLUCAP 122* 203* 125*  Resume Lantus  10U at bedtime , controlled 4/24 10.  COPD/history of tobacco use as well as Idiopathic Pulmonary Fibrosis.  Continue inhalers as advised as well as Pirfenidone  3 times daily.  F/u with Dr Bertrum Brodie , pulmonary  11.  Hyperlipidemia.  Crestor /Zetia  12.  Permissive hypertension.  Patient on Norvasc  10 mg daily, hydralazine  25 mg every 8 hours prior to admission.  Resume as needed Vitals:   10/04/23 1945 10/05/23 0542  BP: (!) 144/88 (!) 153/81  Pulse: 67 (!) 58  Resp: 17 17  Temp: 97.9 F (36.6 C) 97.6 F (36.4 C)  SpO2: 100% 100%  On amlodipine  5mg  per day increase to 10mg  4/24 13.  History of rectal cancer.  Follow-up outpatient Dr. Maryalice Smaller 14.  GERD.  Protonix   15. Daily HA's- actually more characteristic of CVA related dysesthetic pain.  Will change gabapentin  to 100mg  TID (was on prn) , may d/c topamax   16. Insomnia  4/23-overall improved Trazodone  50 mg at bedtime for sleep  17.  Ceruminosis completed debrox  18.  Dysphagia- mainly poor sensation in Right side throat due to thalamic infarct Left side, will enc po intake, reassure that swallowing function is intact  LOS: 7 days A FACE TO FACE EVALUATION WAS PERFORMED  Erica [name] 10/05/2023, 8:23 AM

## 2023-10-05 NOTE — Progress Notes (Signed)
 Occupational Therapy Session Note  Patient Details  Name: Erica [name] MRN: [id number] Date of Birth: 29-Aug-1955  Today's Date: 10/05/2023 OT Individual Time: 4098-1191 OT Individual Time Calculation (min): 73 min    Short Term Goals: Week 1:  OT Short Term Goal 1 (Week 1): STG=LTG d/t ELOS  Skilled Therapeutic Interventions/Progress Updates:  Pt greeted supine in bed, pt agreeable to OT intervention.      Transfers/bed mobility/functional mobility:  Pt completed supine>sit with CGA for safety. Pt completed sit>stand with SPC with CGA, assist needed for safety to rise from low EOB. Pt completed functional ambulation into bathroom with SPC for ADLs. Ambulatory transfer into walkin shower with use of grab bars with CGA.   Pt exited shower with SPC and CGA for + safety d/t wet environment.   Pt completed functional ambulation in hallway with SPC however LLE noted to buckle needing MIN A to recover, pt transported remainder of distance to LLE weakness.     ADLs:  Grooming: pt donned deo and lotion from EOB with supervision  UB dressing:pt donned bra from EOB with supervision, as well as [place] shirt.  LB dressing: pt donned brief and pants from EOB with supervision  Footwear: pt donned socks from EOB via figure 4 as well as shoes  Bathing: pt completed bathing seated/standing on TTB with supervision. Set- up assist needed to open personal soap.  Transfers: ambulatory ADL transfers with [facility] and supervision.    IADLS:pt completed simulated IADL task of making up the bed in the apt using RW for support. Pt completed task with close supervision however MIN verbal cues provided oto keep RW on floor.   Education:  Education provided on fall prevention strategies and decreasing clutter, pt reports she doesn't have a lot in her house anyway, pt reports she uses lawn chair as furniture and is "afraid" of end tables.  Discussed energy conservation strategies for ADLs, pt reports she is used to  taking her time during dressing at home.  Problem solved through various IADLs such as laundry, feeding her dog and cleaning. Pt with a safe plan for all tasks.         Ended session with pt seated EOB  with all needs within reach and bed alarm activated.                    Therapy Documentation Precautions:  Precautions Precautions: Fall Recall of Precautions/Restrictions: Intact Precaution/Restrictions Comments: h/o L sided weakness from previous stroke Restrictions Weight Bearing Restrictions Per Provider Order: No  Pain: No pain    Therapy/Group: Individual Therapy  Mollie Anger [facility] 10/05/2023, 12:01 PM

## 2023-10-05 NOTE — Plan of Care (Signed)
  Problem: Consults Goal: RH STROKE PATIENT EDUCATION Description: See Patient Education module for education specifics  Outcome: Progressing   Problem: RH BOWEL ELIMINATION Goal: RH STG MANAGE BOWEL WITH ASSISTANCE Description: STG Manage Bowel with toileting Assistance. Outcome: Progressing Goal: RH STG MANAGE BOWEL W/MEDICATION W/ASSISTANCE Description: STG Manage Bowel with Medication with mod I Assistance. Outcome: Progressing   Problem: RH SAFETY Goal: RH STG ADHERE TO SAFETY PRECAUTIONS W/ASSISTANCE/DEVICE Description: STG Adhere to Safety Precautions With cues Assistance/Device. Outcome: Progressing   Problem: RH PAIN MANAGEMENT Goal: RH STG PAIN MANAGED AT OR BELOW PT'S PAIN GOAL Description: < 4 with prns Outcome: Progressing   Problem: RH KNOWLEDGE DEFICIT Goal: RH STG INCREASE KNOWLEDGE OF DIABETES Description: Patient and son will be able to manage DM using educational resources for medications and dietary modification independently Outcome: Progressing Goal: RH STG INCREASE KNOWLEDGE OF HYPERTENSION Description: Patient and son will be able to manage HTN using educational resources for medications and dietary modification independently Outcome: Progressing Goal: RH STG INCREASE KNOWLEGDE OF HYPERLIPIDEMIA Description: Patient and son will be able to manage HLD using educational resources for medications and dietary modification independently Outcome: Progressing Goal: RH STG INCREASE KNOWLEDGE OF STROKE PROPHYLAXIS Description: Patient and son will be able to manage secondary risk using educational resources for medications and dietary modification independently Outcome: Progressing

## 2023-10-05 NOTE — Progress Notes (Signed)
 Speech Language Pathology Daily Session Note  Patient Details  Name: Erica [name] MRN: [id number] Date of Birth: 1955/10/12  Today's Date: 10/05/2023 SLP Individual Time: 1330-1430 SLP Individual Time Calculation (min): 60 min  Short Term Goals: Week 1: SLP Short Term Goal 1 (Week 1): STG = LTG due to ELOS  Skilled Therapeutic Interventions:   Pt greeted at bedside. She was asleep upon SLP arrival and required modA verbal cues to wake. SLP facilitated a variety of tx tasks targeting cognition and dysphagia. During initial conversation, she endorsed adequate success with upgrade to regular diet/thin liquids. She was able to recall 2 safe swallow strategies independently and benefited from modA to recall remaining strategies. She then completed a verbal task ID abstract categories and naming one additional item. She benefited from [facility] for working memory and attention. She benefited from minA cues to ID one potential problem when provided w/ a ADL/IADL. Throughout verbal tasks, pt demonstrated self limiting behaviors such as frequently responding "I don't know" and highly variable response times. She requested assistance to the bathroom where she was continent of bladder. She required only supervisionA for toileting and minA for ambulation to and from bathroom. Adequate problem solving and sequencing noted. At the end of tx tasks, she was left in the bed with her call light within reach. Recommend cont ST per POC.   Pain  No pain reported  Therapy/Group: Individual Therapy  Rozell Cornet 10/05/2023, 2:57 PM

## 2023-10-05 NOTE — Progress Notes (Signed)
 Physical Therapy Session Note  Patient Details  Name: Erica [name] MRN: [id number] Date of Birth: July 10, 1955  Today's Date: 10/05/2023 PT Individual Time: 1007-1111 PT Individual Time Calculation (min): 64 min   Short Term Goals: Week 1:  PT Short Term Goal 1 (Week 1): STG = LTG d/t ELOS  Skilled Therapeutic Interventions/Progress Updates: Patient sitting EOB with nsg present providing medication on entrance to room. Patient alert and agreeable to PT session.   Patient reported no pain during session. Pt reported fatigue this morning following OT session.   Therapeutic Activity: Bed Mobility: Pt performed sit<supine from EOB with supervision.  Transfers: Pt performed sit<>stand transfers throughout session with CGA/close supervision with RW and without RW. Provided VC for hand placement.  Gait Training:  Pt ambulated roughly 100' around main gym and hallway using RW with light CGA/close supervision. Pt demonstrated the following gait deviations with therapist providing the described cuing and facilitation for improvement:  - Decreased step length on R LE with decreased stance time on L (VC to increase while relaxing B shoulders per elevated presentation - added cuing to decrease WB on B UE on RW)  Neuromuscular Re-ed: NMR facilitated during session with focus on L LE WB, weight shifting, standing balance, proprioceptive feedback, coordination. - Step to 6" step with 4lb ankle weight donned on R LE and B UE support on RW. Pt cued to decrease WB on RW with B UE (cued to maintain depressed B shoulders). 20 reps performed CGA/close supervision  - Progressed to only L HHA with light minA and cues to decrease speed of R LE advancement. Pt performed until fatigue with rest break required. 2nd round performed same level assistance and cuing until fatigue with seated rest break required and pt reporting increase in overall physical fatigue.  - Pt ambulated roughly 100' around main gym and hallway  with 4lb ankle weight donned on R LE with cues to maintain increased step length on R to improve stance time on L (mod cuing to maintain).   NMR performed for improvements in motor control and coordination, balance, sequencing, judgement, and self confidence/ efficacy in performing all aspects of mobility at highest level of independence.   Therapeutic Exercise: Pt performed the following exercises with therapist providing the described cuing and facilitation for improvement. - NuStep on level 3 with only using B LE's to improve strength and endurance (pt reported fatigue towards end of session, but was willing to perform this). 589 total steps with 52 avg spm and 1.7 METs.11 min total and pt reported fatigue and requested to return to room to rest.   Patient supine in bed at end of session with brakes locked, bed alarm set, and all needs within reach.      Therapy Documentation Precautions:  Precautions Precautions: Fall Recall of Precautions/Restrictions: Intact Precaution/Restrictions Comments: h/o L sided weakness from previous stroke Restrictions Weight Bearing Restrictions Per Provider Order: No  Therapy/Group: Individual Therapy  Ledia Hanford PTA 10/05/2023, 12:13 PM

## 2023-10-05 NOTE — Progress Notes (Addendum)
 Patient ID: Erica [name], female   DOB: 1955-10-15, 68 y.o.   MRN: [id number]  Spoke with pt to discuss her plan, discussed discharge plan. Pt feels she needs more aide assist at home for her to recover. Offered to call [employer] where she receives her aide service from to see if can increase their services. Waiting to hear back from her sister regarding her concerns.  Suppose to come visit pt today. Made pt aware unsure if SNF would be covered due to her supervision-mod/I level and being here on CIR.  3:17 pm Met with pt and sister-Erica [name] who is here to visit to discuss plan for home. Erica [name] as spoken with the [facility] provider and will give them my number to get more hours for her PCS aide at home at discharge. Will reach out also to increase her hours of an aide. Sister feels she will do well at home and not in a NH. Plan to go home on Tuesday 4/29. Sister aware of this and will transport pt home

## 2023-10-06 DIAGNOSIS — Z794 Long term (current) use of insulin: Secondary | ICD-10-CM | POA: Diagnosis not present

## 2023-10-06 DIAGNOSIS — E1149 Type 2 diabetes mellitus with other diabetic neurological complication: Secondary | ICD-10-CM | POA: Diagnosis not present

## 2023-10-06 DIAGNOSIS — I6381 Other cerebral infarction due to occlusion or stenosis of small artery: Secondary | ICD-10-CM | POA: Diagnosis not present

## 2023-10-06 LAB — GLUCOSE, CAPILLARY
Glucose-Capillary: 126 mg/dL — ABNORMAL HIGH (ref 70–99)
Glucose-Capillary: 114 mg/dL — ABNORMAL HIGH (ref 70–99)
Glucose-Capillary: 121 mg/dL — ABNORMAL HIGH (ref 70–99)
Glucose-Capillary: 174 mg/dL — ABNORMAL HIGH (ref 70–99)

## 2023-10-06 LAB — BASIC METABOLIC PANEL WITH GFR
Anion gap: 7 (ref 5–15)
BUN: 37 mg/dL — ABNORMAL HIGH (ref 8–23)
CO2: 21 mmol/L — ABNORMAL LOW (ref 22–32)
Calcium: 8.7 mg/dL — ABNORMAL LOW (ref 8.9–10.3)
Chloride: 112 mmol/L — ABNORMAL HIGH (ref 98–111)
Creatinine, Ser: 2.36 mg/dL — ABNORMAL HIGH (ref 0.44–1.00)
GFR, Estimated: 22 mL/min — ABNORMAL LOW (ref >60)
Glucose, Bld: 83 mg/dL (ref 70–99)
Potassium: 4.6 mmol/L (ref 3.5–5.1)
Sodium: 140 mmol/L (ref 135–145)

## 2023-10-06 NOTE — Progress Notes (Signed)
 Speech Language Pathology Daily Session Note  Patient Details  Name: Erica [name] MRN: [id number] Date of Birth: September 04, 1955  Today's Date: 10/06/2023 SLP Individual Time: 1430-1500 SLP Individual Time Calculation (min): 30 min  Short Term Goals: Week 2: SLP Short Term Goal 1 (Week 2): STG = LTG due to ELOS  Skilled Therapeutic Interventions:   Pt greeted at bedside. She was awake/alert upon SLP arrival and cooperative throughout tx tasks targeting cognition. SLP challenged pt to recall task w/ 4 word mnemonic. She immediately recalled 4/4 words and recalled 2/4 words after ~6 min delay. After an additional 10 min delay, she recalled 2/4 words. Success increased to 4/4 words w/ s semantic cues. Between recall times; she completed a menu task targeting working memory, problem solving, organization, and calculations. She required only minA throughout. At the end of tx tasks, she was left in bed with the alarm set and call light within reach. Recommend cont ST.   Pain Pain Assessment Pain Scale: 0-10 Pain Score: 0-No pain  Therapy/Group: Individual Therapy  Rozell Cornet 10/06/2023, 3:18 PM

## 2023-10-06 NOTE — Progress Notes (Signed)
 PROGRESS NOTE   Subjective/Complaints:  ROS-  Pt denies SOB, abd pain, CP, N/V/C/D, and vision changes   Objective:   No results found.  No results for input(s): "WBC", "HGB", "HCT", "PLT" in the last 72 hours.  No results for input(s): "NA", "K", "CL", "CO2", "GLUCOSE", "BUN", "CREATININE", "CALCIUM " in the last 72 hours.   Intake/Output Summary (Last 24 hours) at 10/06/2023 0746 Last data filed at 10/05/2023 1254 Gross per 24 hour  Intake 360 ml  Output --  Net 360 ml        Physical Exam: Vital Signs Blood pressure (!) 153/83, pulse 60, temperature 98.2 F (36.8 C), temperature source Oral, resp. rate 17, height 5\' 2"  (1.575 m), weight 74.6 kg, SpO2 100%.  General: No acute distress Mood and affect are appropriate Heart: Regular rate and rhythm no rubs murmurs or extra sounds Lungs: Clear to auscultation, breathing unlabored, no rales or wheezes Abdomen: Positive bowel sounds, soft nontender to palpation, nondistended Extremities: No clubbing, cyanosis, or edema Skin: No evidence of breakdown, no evidence of rash ENT- ceruminosis Right ear canal, no erythema or tenderness with exam , TM not visualized  Neurologic: Oriented x 3, motor strength is 4/5 in bilateral deltoid, bicep, tricep, grip, hip flexor, knee extensors, ankle dorsiflexor and plantar flexor Sensory exam Reduced sensation left arm and right V2, V3 as well as temporal scalp area  Cerebellar exam Mild dysmetria FNF bilateral  Musculoskeletal: Full range of motion in all 4 extremities. No joint swelling  Assessment/Plan: 1. Functional deficits which require 3+ hours per day of interdisciplinary therapy in a comprehensive inpatient rehab setting. Physiatrist is providing close team supervision and 24 hour management of active medical problems listed below. Physiatrist and rehab team continue to assess barriers to discharge/monitor patient progress  toward functional and medical goals  Care Tool:  Bathing    Body parts bathed by patient: Right arm, Left arm, Chest, Abdomen, Front perineal area, Right upper leg, Left upper leg, Right lower leg, Left lower leg, Face         Bathing assist Assist Level: Supervision/Verbal cueing     Upper Body Dressing/Undressing Upper body dressing   What is the patient wearing?: Pull over shirt    Upper body assist Assist Level: Supervision/Verbal cueing    Lower Body Dressing/Undressing Lower body dressing      What is the patient wearing?: Underwear/pull up, Pants     Lower body assist Assist for lower body dressing: Supervision/Verbal cueing     Toileting Toileting    Toileting assist Assist for toileting: Supervision/Verbal cueing     Transfers Chair/bed transfer  Transfers assist     Chair/bed transfer assist level: Contact Guard/Touching assist     Locomotion Ambulation   Ambulation assist      Assist level: Contact Guard/Touching assist Assistive device: Cane-straight Max distance: 170 ft   Walk 10 feet activity   Assist     Assist level: Supervision/Verbal cueing Assistive device: Cane-straight   Walk 50 feet activity   Assist    Assist level: Contact Guard/Touching assist Assistive device: Cane-straight    Walk 150 feet activity   Assist    Assist level:  Contact Guard/Touching assist Assistive device: Cane-straight    Walk 10 feet on uneven surface  activity   Assist     Assist level: Contact Guard/Touching assist Assistive device: Cane-straight   Wheelchair     Assist Is the patient using a wheelchair?: Yes Type of Wheelchair: Manual    Wheelchair assist level: Dependent - Patient 0% Max wheelchair distance: 175 ft    Wheelchair 50 feet with 2 turns activity    Assist        Assist Level: Dependent - Patient 0%   Wheelchair 150 feet activity     Assist      Assist Level: Dependent - Patient 0%    Blood pressure (!) 153/83, pulse 60, temperature 98.2 F (36.8 C), temperature source Oral, resp. rate 17, height 5\' 2"  (1.575 m), weight 74.6 kg, SpO2 100%.  Medical Problem List and Plan: 1. 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  Loop recorder placed 2022             -patient may shower             -ELOS/Goals: 10/10/2023 Sup / mod I goals 2.  Antithrombotics: -DVT/anticoagulation:  Pharmaceutical: Heparin  may d/c amb >321ft             -antiplatelet therapy: Aspirin  81 mg daily and Plavix  75 mg day x 3 weeks then Plavix  alone 3. Pain Management: Neurontin  100 mg  daily as needed, Tylenol  as needed  4/19- added Fioricet prn for HA's 4. Mood/Behavior/Sleep: Effexor  150 mg daily, Zoloft  50 mg daily             -antipsychotic agents: N/A 5. Neuropsych/cognition: This patient is usually capable of making decisions on her own behalf. 6. Skin/Wound Care: Routine skin checks 7. Fluids/Electrolytes/Nutrition: Routine in and outs with follow-up chemistries 8.  CKD stage III.  Creatinine baseline 1.27-2.0.  Follow-up chemistries    Latest Ref Rng & Units 09/29/2023    5:33 AM 09/28/2023    5:13 PM 09/27/2023    9:03 AM  BMP  Glucose 70 - 99 mg/dL 161   096   BUN 8 - 23 mg/dL 35   26   Creatinine 0.45 - 1.00 mg/dL 4.09  8.11  9.14   Sodium 135 - 145 mmol/L 139   140   Potassium 3.5 - 5.1 mmol/L 4.6   4.7   Chloride 98 - 111 mmol/L 112   108   CO2 22 - 32 mmol/L 19   21   Calcium  8.9 - 10.3 mg/dL 8.8   8.8    At baseline  9.  Diabetes mellitus.  Hemoglobin A1c 5.6.  Currently SSI.  Prior to admission patient on Lantus  insulin  30 units daily as well as Farxiga 10 mg daily and resume as needed  CBG (last 3)  Recent Labs    10/05/23 1604 10/05/23 2041 10/06/23 0606  GLUCAP 166* 172* 126*  Resume Lantus  10U at bedtime ,  controlled 4/25 GFR 29 on 4/18, recheck if >30, may resume Farxiga start at 5mg   10.  COPD/history of tobacco use as well as Idiopathic Pulmonary Fibrosis.  Continue inhalers as advised as well as Pirfenidone  3 times daily.  F/u with Dr Bertrum Brodie , pulmonary  11.  Hyperlipidemia.  Crestor /Zetia  12.  Permissive hypertension.  Patient on Norvasc  10 mg daily, hydralazine  25 mg every 8  hours prior to admission.  Resume as needed Vitals:   10/05/23 1941 10/06/23 0609  BP: 134/79 (!) 153/83  Pulse: 62 60  Resp: 17 17  Temp: 98.4 F (36.9 C) 98.2 F (36.8 C)  SpO2: 100% 100%  On amlodipine  5mg  per day increase to 10mg  4/25, may take 3-4 more days to see full effect 13.  History of rectal cancer.  Follow-up outpatient Dr. Maryalice Smaller 14.  GERD.  Protonix   15. Daily HA's- actually more characteristic of CVA related dysesthetic pain.  Will change gabapentin  to 100mg  TID (was on prn) , may d/c topamax   16. Insomnia  4/23-overall improved Trazodone  50 mg at bedtime for sleep  17.  Ceruminosis completed debrox  18.  Dysphagia- mainly poor sensation in Right side throat due to thalamic infarct Left side, will enc po intake, reassure that swallowing function is intact  LOS: 8 days A FACE TO FACE EVALUATION WAS PERFORMED  Genetta Kenning 10/06/2023, 7:46 AM

## 2023-10-06 NOTE — Progress Notes (Signed)
 Patient ID: Erica [name], female   DOB: 1955/12/18, 68 y.o.   MRN: [id number]  Spoke with Larita Pluck to find out what is needed to get pt more aide hours. Have emailed clinicals and she will work on getting pt more aide hours per week at home. Pt in agreement with this and would feel better if has more hours at home

## 2023-10-06 NOTE — Progress Notes (Signed)
 Speech Language Pathology Weekly Progress and Session Note  Patient Details  Name: Erica [name] MRN: [id number] Date of Birth: 08/11/1955  Beginning of progress report period: September 29, 2023 End of progress report period: October 10, 2023  Today's Date: 10/06/2023 SLP Individual Time: 1000-1100 SLP Individual Time Calculation (min): 60 min  Short Term Goals: Week 1: SLP Short Term Goal 1 (Week 1): STG = LTG due to ELOS    New Short Term Goals: Week 2: SLP Short Term Goal 1 (Week 2): STG = LTG due to ELOS  Weekly Progress Updates: Pt has made great gains this reporting period and is progressing towards LTGs due to improved swallowing, cognition and communication. Currently, patient continues to require supervision A for consumption of regular/thin diet and min-modA for cognitive skills (problem solving, memory). Patient is 100% intelligible at the conversational level. Pt/family eduction ongoing. Pt would benefit from continued ST intervention to maximize swallowing, expression and cognition in order to maximize functional independence at d/c.    Intensity: Minumum of 1-2 x/day, 30 to 90 minutes Frequency: 3 to 5 out of 7 days Duration/Length of Stay: 4/29 Treatment/Interventions: Cognitive remediation/compensation;Dysphagia/aspiration precaution training;Internal/external aids;Speech/Language facilitation;Therapeutic Activities;Functional tasks;Multimodal communication approach;Patient/family education   Daily Session  Skilled Therapeutic Interventions:  Skilled therapy session focused on cognitive goals. SLP facilitated session by reviewing memory strategies. Patient independently recalled "write" and "repeat" strategies, though required re-education on remainder. SLP prompted patient to utilize strategies during paragraph comprehension tasks. Patient answered comprehension questions regarding three stories after a 10-15 minute delay with 86% accuracy independently and 100% accuracy  given minA. SLP continued to target cognitive goals through problem solving task. SLP prompted patient to count coins according to pictures amount. Patient did so with minA. Patient left in bed with alarm set and call bell in reach. Continue POC      Pain None reported  Therapy/Group: Individual Therapy  Gusta Marksberry M.A., CCC-SLP 10/06/2023, 12:12 PM

## 2023-10-06 NOTE — Progress Notes (Signed)
 Occupational Therapy Session Note  Patient Details  Name: Erica [name] MRN: [id number] Date of Birth: [birth year]  Today's Date: 10/06/2023 OT Individual Time: 1120-1200 OT Individual Time Calculation (min): 40 min    Short Term Goals: Week 1:  OT Short Term Goal 1 (Week 1): STG=LTG d/t ELOS  Skilled Therapeutic Interventions/Progress Updates:  Pt greeted asleep in supine, pt easily able to arouse and pt agreeable to OT intervention.      Transfers/bed mobility/functional mobility:  Pt completed supine>sit with supervision. Sit>stand from EOB with RW and CGA, ambulatory transfer to w/c with RW and CGA.   Therapeutic activity:  Pt completed dynamic balance task with pt standing on 2 inch step with BUE support from hi low table. pt instructed to step laterally off step to tap stimulus on floor to challenge single leg stance for LB ADLs and improve BLE strength and endurance. Pt completed task with CGA for balance,  no LOB noted.   Assessments:  Functional cognition further assessed with The Pillbox Test: A Measure of Executive Functioning and Estimate of Medication Management. A straight pass/fail designation is determined by 3 or more errors of omission or misplacement on the task. Pt had a total of 7 errors.and failed the assessment, demonstrating deficits with planning, mental flexibility, suboptimal search strategies, concrete thinking and difficulty with multitasking.   Once errors were brought to pts attention pt was then able to agree that she would allow her sister to assist her with med mgmt.    Ended session with pt seated EOB with all needs within reach and bed alarm activated.                    Therapy Documentation Precautions:  Precautions Precautions: Fall Recall of Precautions/Restrictions: Intact Precaution/Restrictions Comments: h/o L sided weakness from previous stroke Restrictions Weight Bearing Restrictions Per Provider Order: No  Pain: No pain     Therapy/Group: Individual Therapy  Erica [name] 10/06/2023, 2:59 PM

## 2023-10-06 NOTE — Progress Notes (Signed)
 Physical Therapy Session Note  Patient Details  Name: Erica [name] MRN: [id number] Date of Birth: 1956-05-02  Today's Date: 10/06/2023 PT Individual Time: 7829-5621 PT Individual Time Calculation (min): 71 min   Short Term Goals: Week 1:  PT Short Term Goal 1 (Week 1): STG = LTG d/t ELOS  Skilled Therapeutic Interventions/Progress Updates:     Pt received seated at EOB and agrees to therapy. No complaint of pain. As pt finishes breakfast, PT has discussion with her regarding concerns for discharge and continued emphasis on mobility. Pt dons shoes while seated at EOB with setup assistance and increased time required. Pt stands from bed to RW with cues for initiation, then ambulates to sink and performs face washing and teeth brushing to workon standing balance and activity tolerance.   Pt ambulates x175' to gym with RW and cues to decrease WB through upper extremities to improve energy conservation and body mechanics. Pt has chronic LLE weakness from prior stroke and has tendency to drag Lt toes, especially with fatigue. During rest break, PT ace wraps Lt foot to promote dorsiflexion and eversion to allow increased endurance training without having Lt foot drag as limiting factor. Pt ambulates x150' to dayroom with RW and improved foot clearance. Seated rest break. Pt completes 6 minute walk test with RW and similar cues, ambulating 350' in 4:17 and requires seated rest break.   Pt completes TUG test, but requires multiple attempts due to misunderstanding of corret steps and sequencing of task. On 3rd attempt, pt completes correctly with time of 20.7 seconds with RW and cues for safe hand placement.   Seated rest break prior to ambulating back to room. Pt ambulates x325' back to room with RW. Left seated at EOB with all needs within reach.   Therapy Documentation Precautions:  Precautions Precautions: Fall Recall of Precautions/Restrictions: Intact Precaution/Restrictions Comments: h/o L  sided weakness from previous stroke Restrictions Weight Bearing Restrictions Per Provider Order: No   Therapy/Group: Individual Therapy  Neva Barban, PT, DPT 10/06/2023, 4:04 PM

## 2023-10-06 NOTE — Progress Notes (Signed)
 Occupational Therapy Weekly Progress Note  Patient Details  Name: CELES DEDIC MRN: [id number] Date of Birth: [birth year]  Beginning of progress report period: September 29, 2023 End of progress report period: October 06, 2023  Today's Date: 10/06/2023   Pt continuing to progress towards LTGs this reporting period. Pt currently completes ADLs with supervision using RW. Pt continues to present with decreased activity tolerance with pt continuing to require supervision for higher level IADLs such as med mgmt d/t cognitive deficits, however pt reports that her sister usually assists her with her pill box. DC date set for 4/29.   Patient continues to demonstrate the following deficits: muscle weakness, decreased cardiorespiratoy endurance, impaired timing and sequencing, unbalanced muscle activation, and decreased coordination, and decreased standing balance, decreased postural control, and decreased balance strategies and therefore will continue to benefit from skilled OT intervention to enhance overall performance with BADL and Reduce care partner burden.  Patient progressing toward long term goals..  Continue plan of care.  OT Short Term Goals Week 1:  OT Short Term Goal 1 (Week 1): STG=LTG d/t ELOS Week 2:  OT Short Term Goal 1 (Week 2): STG= LTGs   Therapy Documentation Precautions:  Precautions Precautions: Fall Recall of Precautions/Restrictions: Intact Precaution/Restrictions Comments: h/o L sided weakness from previous stroke Restrictions Weight Bearing Restrictions Per Provider Order: No    Therapy/Group: Individual Therapy  Willadean Hark 10/06/2023, 3:26 PM

## 2023-10-07 LAB — GLUCOSE, CAPILLARY
Glucose-Capillary: 137 mg/dL — ABNORMAL HIGH (ref 70–99)
Glucose-Capillary: 131 mg/dL — ABNORMAL HIGH (ref 70–99)
Glucose-Capillary: 159 mg/dL — ABNORMAL HIGH (ref 70–99)
Glucose-Capillary: 176 mg/dL — ABNORMAL HIGH (ref 70–99)

## 2023-10-07 NOTE — Progress Notes (Signed)
 PROGRESS NOTE   Subjective/Complaints:  No HA or ear pain, pt is still having numbness in throat ROS-  Pt denies SOB, abd pain, CP, N/V/C/D, and vision changes   Objective:   No results found.  No results for input(s): "WBC", "HGB", "HCT", "PLT" in the last 72 hours.  Recent Labs    10/06/23 1221  NA 140  K 4.6  CL 112*  CO2 21*  GLUCOSE 83  BUN 37*  CREATININE 2.36*  CALCIUM  8.7*     Intake/Output Summary (Last 24 hours) at 10/07/2023 0750 Last data filed at 10/06/2023 1800 Gross per 24 hour  Intake 956 ml  Output --  Net 956 ml        Physical Exam: Vital Signs Blood pressure (!) 152/86, pulse 69, temperature 97.8 F (36.6 C), temperature source Oral, resp. rate 18, height 5\' 2"  (1.575 m), weight 74.6 kg, SpO2 98%.  General: No acute distress Mood and affect are appropriate Heart: Regular rate and rhythm no rubs murmurs or extra sounds Lungs: Clear to auscultation, breathing unlabored, no rales or wheezes Abdomen: Positive bowel sounds, soft nontender to palpation, nondistended Extremities: No clubbing, cyanosis, or edema Skin: No evidence of breakdown, no evidence of rash ENT- ceruminosis Right ear canal, no erythema or tenderness with exam , TM not visualized  Neurologic: Oriented x 3, motor strength is 4/5 in bilateral deltoid, bicep, tricep, grip, hip flexor, knee extensors, ankle dorsiflexor and plantar flexor Sensory exam Reduced sensation left arm and right V2, V3 as well as temporal scalp area  Cerebellar exam Mild dysmetria FNF bilateral  Musculoskeletal: Full range of motion in all 4 extremities. No joint swelling  Assessment/Plan: 1. Functional deficits which require 3+ hours per day of interdisciplinary therapy in a comprehensive inpatient rehab setting. Physiatrist is providing close team supervision and 24 hour management of active medical problems listed below. Physiatrist and rehab  team continue to assess barriers to discharge/monitor patient progress toward functional and medical goals  Care Tool:  Bathing    Body parts bathed by patient: Right arm, Left arm, Chest, Abdomen, Front perineal area, Right upper leg, Left upper leg, Right lower leg, Left lower leg, Face         Bathing assist Assist Level: Supervision/Verbal cueing     Upper Body Dressing/Undressing Upper body dressing   What is the patient wearing?: Pull over shirt    Upper body assist Assist Level: Supervision/Verbal cueing    Lower Body Dressing/Undressing Lower body dressing      What is the patient wearing?: Underwear/pull up, Pants     Lower body assist Assist for lower body dressing: Supervision/Verbal cueing     Toileting Toileting    Toileting assist Assist for toileting: Supervision/Verbal cueing     Transfers Chair/bed transfer  Transfers assist     Chair/bed transfer assist level: Contact Guard/Touching assist     Locomotion Ambulation   Ambulation assist      Assist level: Contact Guard/Touching assist Assistive device: Cane-straight Max distance: 170 ft   Walk 10 feet activity   Assist     Assist level: Supervision/Verbal cueing Assistive device: Cane-straight   Walk 50 feet activity  Assist    Assist level: Contact Guard/Touching assist Assistive device: Cane-straight    Walk 150 feet activity   Assist    Assist level: Contact Guard/Touching assist Assistive device: Cane-straight    Walk 10 feet on uneven surface  activity   Assist     Assist level: Contact Guard/Touching assist Assistive device: Cane-straight   Wheelchair     Assist Is the patient using a wheelchair?: Yes Type of Wheelchair: Manual    Wheelchair assist level: Dependent - Patient 0% Max wheelchair distance: 175 ft    Wheelchair 50 feet with 2 turns activity    Assist        Assist Level: Dependent - Patient 0%   Wheelchair 150 feet  activity     Assist      Assist Level: Dependent - Patient 0%   Blood pressure (!) 152/86, pulse 69, temperature 97.8 F (36.6 C), temperature source Oral, resp. rate 18, height 5\' 2"  (1.575 m), weight 74.6 kg, SpO2 98%.  Medical Problem List and Plan: 1. 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  Loop recorder placed 2022             -patient may shower             -ELOS/Goals: 10/10/2023 Sup / mod I goals 2.  Antithrombotics: -DVT/anticoagulation:  Pharmaceutical: Heparin  may d/c amb >354ft             -antiplatelet therapy: Aspirin  81 mg daily and Plavix  75 mg day x 3 weeks then Plavix  alone 3. Pain Management: Neurontin  100 mg  daily as needed, Tylenol  as needed  4/19- added Fioricet prn for HA's 4. Mood/Behavior/Sleep: Effexor  150 mg daily, Zoloft  50 mg daily             -antipsychotic agents: N/A 5. Neuropsych/cognition: This patient is usually capable of making decisions on her own behalf. 6. Skin/Wound Care: Routine skin checks 7. Fluids/Electrolytes/Nutrition: Routine in and outs with follow-up chemistries 8.  CKD stage III.  Creatinine baseline 1.27-2.0.  Follow-up chemistries    Latest Ref Rng & Units 10/06/2023   12:21 PM 09/29/2023    5:33 AM 09/28/2023    5:13 PM  BMP  Glucose 70 - 99 mg/dL 83  409    BUN 8 - 23 mg/dL 37  35    Creatinine 8.11 - 1.00 mg/dL 9.14  7.82  9.56   Sodium 135 - 145 mmol/L 140  139    Potassium 3.5 - 5.1 mmol/L 4.6  4.6    Chloride 98 - 111 mmol/L 112  112    CO2 22 - 32 mmol/L 21  19    Calcium  8.9 - 10.3 mg/dL 8.7  8.8     At baseline  9.  Diabetes mellitus.  Hemoglobin A1c 5.6.  Currently SSI.  Prior to admission patient on Lantus  insulin  30 units daily as well as Farxiga 10 mg daily and resume as needed  CBG (last 3)  Recent Labs    10/06/23 1638 10/06/23 2045  10/07/23 0601  GLUCAP 121* 174* 137*  Resume Lantus  10U at bedtime , controlled 4/25 GFR 29 on 4/18, recheck if >30, may resume Farxiga start at 5mg   10.  COPD/history of tobacco use as well as Idiopathic Pulmonary Fibrosis.  Continue inhalers as advised as well as Pirfenidone  3 times daily.  F/u with  Dr Bertrum Brodie , pulmonary  11.  Hyperlipidemia.  Crestor /Zetia  12.  Permissive hypertension.  Patient on Norvasc  10 mg daily, hydralazine  25 mg every 8 hours prior to admission.  Resume as needed Vitals:   10/06/23 1934 10/07/23 0315  BP: 135/77 (!) 152/86  Pulse: 69 69  Resp: 18 18  Temp: 97.8 F (36.6 C) 97.8 F (36.6 C)  SpO2: 100% 98%  On amlodipine  5mg  per day increase to 10mg  4/25, may take 3-4 more days to see full effect Pt does have early am HTN, consider adding Hydralazine   13.  History of rectal cancer.  Follow-up outpatient Dr. Maryalice Smaller 14.  GERD.  Protonix   15. Daily HA's- actually more characteristic of CVA related dysesthetic pain.  Will change gabapentin  to 100mg  TID (was on prn) , may d/c topamax   16. Insomnia  4/23-overall improved Trazodone  50 mg at bedtime for sleep  17.  Ceruminosis completed debrox  18.  Dysphagia- mainly poor sensation in Right side throat due to thalamic infarct Left side, will enc po intake, reassure that swallowing function is intact  LOS: 9 days A FACE TO FACE EVALUATION WAS PERFORMED  Genetta Kenning 10/07/2023, 7:50 AM

## 2023-10-08 LAB — GLUCOSE, CAPILLARY
Glucose-Capillary: 118 mg/dL — ABNORMAL HIGH (ref 70–99)
Glucose-Capillary: 167 mg/dL — ABNORMAL HIGH (ref 70–99)
Glucose-Capillary: 209 mg/dL — ABNORMAL HIGH (ref 70–99)

## 2023-10-08 MED ORDER — DAPAGLIFLOZIN PROPANEDIOL 5 MG PO TABS: 5.0000 mg | ORAL_TABLET | Freq: Every day | ORAL | Status: DC

## 2023-10-08 NOTE — Progress Notes (Signed)
 PROGRESS NOTE   Subjective/Complaints:  No issues overnite , pt thinks d/c date is tomorrow , discussed that she is gong Tuesday  ROS-  Pt denies SOB, abd pain, CP, N/V/C/D, and vision changes   Objective:   No results found.  No results for input(s): "WBC", "HGB", "HCT", "PLT" in the last 72 hours.  Recent Labs    10/06/23 1221  NA 140  K 4.6  CL 112*  CO2 21*  GLUCOSE 83  BUN 37*  CREATININE 2.36*  CALCIUM  8.7*     Intake/Output Summary (Last 24 hours) at 10/08/2023 0803 Last data filed at 10/07/2023 1820 Gross per 24 hour  Intake 649 ml  Output --  Net 649 ml        Physical Exam: Vital Signs Blood pressure (!) 146/82, pulse 75, temperature 97.7 F (36.5 C), temperature source Oral, resp. rate 18, height 5\' 2"  (1.575 m), weight 74.6 kg, SpO2 100%.  General: No acute distress Mood and affect are appropriate Heart: Regular rate and rhythm no rubs murmurs or extra sounds Lungs: Clear to auscultation, breathing unlabored, no rales or wheezes Abdomen: Positive bowel sounds, soft nontender to palpation, nondistended Extremities: No clubbing, cyanosis, or edema Skin: No evidence of breakdown, no evidence of rash ENT- ceruminosis Right ear canal, no erythema or tenderness with exam , TM not visualized  Neurologic: Oriented x 3, motor strength is 4/5 in bilateral deltoid, bicep, tricep, grip, hip flexor, knee extensors, ankle dorsiflexor and plantar flexor Sensory exam Reduced sensation left arm and right V2, V3 as well as temporal scalp area  Cerebellar exam Mild dysmetria FNF bilateral  Musculoskeletal: Full range of motion in all 4 extremities. No joint swelling  Assessment/Plan: 1. Functional deficits which require 3+ hours per day of interdisciplinary therapy in a comprehensive inpatient rehab setting. Physiatrist is providing close team supervision and 24 hour management of active medical problems  listed below. Physiatrist and rehab team continue to assess barriers to discharge/monitor patient progress toward functional and medical goals  Care Tool:  Bathing    Body parts bathed by patient: Right arm, Left arm, Chest, Abdomen, Front perineal area, Right upper leg, Left upper leg, Right lower leg, Left lower leg, Face         Bathing assist Assist Level: Supervision/Verbal cueing     Upper Body Dressing/Undressing Upper body dressing   What is the patient wearing?: Pull over shirt    Upper body assist Assist Level: Supervision/Verbal cueing    Lower Body Dressing/Undressing Lower body dressing      What is the patient wearing?: Underwear/pull up, Pants     Lower body assist Assist for lower body dressing: Supervision/Verbal cueing     Toileting Toileting    Toileting assist Assist for toileting: Supervision/Verbal cueing     Transfers Chair/bed transfer  Transfers assist     Chair/bed transfer assist level: Contact Guard/Touching assist     Locomotion Ambulation   Ambulation assist      Assist level: Contact Guard/Touching assist Assistive device: Cane-straight Max distance: 170 ft   Walk 10 feet activity   Assist     Assist level: Supervision/Verbal cueing Assistive device: Cane-straight  Walk 50 feet activity   Assist    Assist level: Contact Guard/Touching assist Assistive device: Cane-straight    Walk 150 feet activity   Assist    Assist level: Contact Guard/Touching assist Assistive device: Cane-straight    Walk 10 feet on uneven surface  activity   Assist     Assist level: Contact Guard/Touching assist Assistive device: Cane-straight   Wheelchair     Assist Is the patient using a wheelchair?: Yes Type of Wheelchair: Manual    Wheelchair assist level: Dependent - Patient 0% Max wheelchair distance: 175 ft    Wheelchair 50 feet with 2 turns activity    Assist        Assist Level: Dependent  - Patient 0%   Wheelchair 150 feet activity     Assist      Assist Level: Dependent - Patient 0%   Blood pressure (!) 146/82, pulse 75, temperature 97.7 F (36.5 C), temperature source Oral, resp. rate 18, height 5\' 2"  (1.575 m), weight 74.6 kg, SpO2 100%.  Medical Problem List and Plan: 1. 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  Loop recorder placed 2022             -patient may shower             -ELOS/Goals: 10/10/2023 Sup / mod I goals 2.  Antithrombotics: -DVT/anticoagulation:  Pharmaceutical: Heparin  may d/c amb >360ft             -antiplatelet therapy: Aspirin  81 mg daily and Plavix  75 mg day x 3 weeks then Plavix  alone 3. Pain Management: Neurontin  100 mg  daily as needed, Tylenol  as needed  4/19- added Fioricet prn for HA's 4. Mood/Behavior/Sleep: Effexor  150 mg daily, Zoloft  50 mg daily             -antipsychotic agents: N/A 5. Neuropsych/cognition: This patient is usually capable of making decisions on her own behalf. 6. Skin/Wound Care: Routine skin checks 7. Fluids/Electrolytes/Nutrition: Routine in and outs with follow-up chemistries 8.  CKD stage IIIb.  Creatinine baseline 1.27-2.0.  Follow-up chemistries    Latest Ref Rng & Units 10/06/2023   12:21 PM 09/29/2023    5:33 AM 09/28/2023    5:13 PM  BMP  Glucose 70 - 99 mg/dL 83  621    BUN 8 - 23 mg/dL 37  35    Creatinine 3.08 - 1.00 mg/dL 6.57  8.46  9.62   Sodium 135 - 145 mmol/L 140  139    Potassium 3.5 - 5.1 mmol/L 4.6  4.6    Chloride 98 - 111 mmol/L 112  112    CO2 22 - 32 mmol/L 21  19    Calcium  8.9 - 10.3 mg/dL 8.7  8.8     At baseline  9.  Diabetes mellitus.  Hemoglobin A1c 5.6.  Currently SSI.  Prior to admission patient on Lantus  insulin  30 units daily as well as Farxiga 10 mg daily and resume as needed  CBG (last 3)  Recent Labs     10/07/23 1647 10/07/23 2034 10/08/23 0545  GLUCAP 159* 176* 118*  Resume Lantus  10U at bedtime , controlled 4/25 GFR 29 on 4/18, recheck if >30, may resume Farxiga start at 5mg   10.  COPD/history of tobacco use as well as Idiopathic Pulmonary Fibrosis.  Continue inhalers as advised as well as Pirfenidone   3 times daily.  F/u with Dr Bertrum Brodie , pulmonary  11.  Hyperlipidemia.  Crestor /Zetia  12.  Essential  hypertension.  Patient on Norvasc  10 mg daily, hydralazine  25 mg every 8 hours prior to admission.  Resume as needed Vitals:   10/07/23 1920 10/08/23 0543  BP: 125/77 (!) 146/82  Pulse: 74 75  Resp: 18 18  Temp: 97.9 F (36.6 C) 97.7 F (36.5 C)  SpO2: 100% 100%  On amlodipine  5mg  per day increase to 10mg  4/25, may take 2-3 more days to see full effect Pt does have early am HTN, consider adding Hydralazine   Some improvement 4/27 13.  History of rectal cancer.  Follow-up outpatient Dr. Maryalice Smaller 14.  GERD.  Protonix   15. Daily HA's- actually more characteristic of CVA related dysesthetic pain.  Will change gabapentin  to 100mg  TID (was on prn) , may d/c topamax   16. Insomnia  4/23-overall improved Trazodone  50 mg at bedtime for sleep  17.  Ceruminosis completed debrox  18.  Dysphagia- mainly poor sensation in Right side throat due to thalamic infarct Left side, will enc po intake, reassure that swallowing function is intact  LOS: 10 days A FACE TO FACE EVALUATION WAS PERFORMED  Genetta Kenning 10/08/2023, 8:03 AM

## 2023-10-08 NOTE — Progress Notes (Signed)
 Occupational Therapy Session Note  Patient Details  Name: Erica [name] MRN: [id number] Date of Birth: [birth year]  Today's Date: 10/08/2023 OT Individual Time: 0100-0215 OT Individual Time Calculation (min): 75 min    Short Term Goals: Week 1:  OT Short Term Goal 1 (Week 1): STG=LTG d/t ELOS  Skilled Therapeutic Interventions/Progress Updates:    Patient in bed at the time of arrival.Patient indicated that she rested well on last night and that she had no pain to report. The pt was able to come from supine in bed to EOB with close S. The pt was able to ambulate to the rest room using the single prong cane with CGA. The pt was able to doff LB clothing items, a brief and pants with close S. The pt was able to complete toileting hygiene with closeS. The pt was able to ambulate from the rest room to wash her hand and face in standing while positioned at the sink  with closeS. Patient able to brush her teeth in standing at the same LOF. The returned to EOB and was able to donn her socks and shoes with close S. The pt was transported via w/c to outside and was able to ambulate around the patio area adjacent to the valet parking.  The pt was able to come from sit to stahd using the Rollator with CGA. The pt was able to  amblulate from one surface to the next while  transitioning  from standing to sitting using the armrest of the chair and CGA. The pt was transported back  to her room  using the wc. The pt went on to complete  UB exercises using her cane.  The pt completed  2 sets of 10 for shld flexion, shld rotation , and horizontal abduction with rest breaks as needed.The pt required 2 rest breaks. The pt was able to ambulate to the recliner and go from standing to sit with closeS.The call light and bed side table were placed within reach with all additional needs addressed prior to me exiting the room. .   Therapy Documentation Precautions:  Precautions Precautions: Fall Recall of  Precautions/Restrictions: Intact Precaution/Restrictions Comments: h/o L sided weakness from previous stroke Restrictions Weight Bearing Restrictions Per Provider Order: No  Therapy/Group: Individual Therapy  Moises Ang 10/08/2023, 4:16 PM

## 2023-10-08 NOTE — Plan of Care (Signed)
  Problem: Consults Goal: RH STROKE PATIENT EDUCATION Description: See Patient Education module for education specifics  Outcome: Progressing   Problem: RH SAFETY Goal: RH STG ADHERE TO SAFETY PRECAUTIONS W/ASSISTANCE/DEVICE Description: STG Adhere to Safety Precautions With  cues Assistance/Device. Outcome: Progressing   Problem: RH PAIN MANAGEMENT Goal: RH STG PAIN MANAGED AT OR BELOW PT'S PAIN GOAL Description: < 4 with prns Outcome: Progressing

## 2023-10-09 ENCOUNTER — Ambulatory Visit: Admitting: Physical Therapy

## 2023-10-09 ENCOUNTER — Encounter: Admitting: Occupational Therapy

## 2023-10-09 ENCOUNTER — Other Ambulatory Visit (HOSPITAL_COMMUNITY): Payer: Self-pay

## 2023-10-09 ENCOUNTER — Encounter: Admitting: Speech Pathology

## 2023-10-09 DIAGNOSIS — G441 Vascular headache, not elsewhere classified: Secondary | ICD-10-CM

## 2023-10-09 DIAGNOSIS — E118 Type 2 diabetes mellitus with unspecified complications: Secondary | ICD-10-CM

## 2023-10-09 DIAGNOSIS — I10 Essential (primary) hypertension: Secondary | ICD-10-CM

## 2023-10-09 LAB — GLUCOSE, CAPILLARY
Glucose-Capillary: 121 mg/dL — ABNORMAL HIGH (ref 70–99)
Glucose-Capillary: 88 mg/dL (ref 70–99)
Glucose-Capillary: 187 mg/dL — ABNORMAL HIGH (ref 70–99)
Glucose-Capillary: 241 mg/dL — ABNORMAL HIGH (ref 70–99)

## 2023-10-09 MED ORDER — GABAPENTIN 100 MG PO CAPS
100.0000 mg | ORAL_CAPSULE | Freq: Three times a day (TID) | ORAL | 0 refills | Status: AC
  Filled 2023-10-09: qty 90, 30d supply, fill #0

## 2023-10-09 MED ORDER — HYDRALAZINE HCL 25 MG PO TABS
25.0000 mg | ORAL_TABLET | Freq: Three times a day (TID) | ORAL | 0 refills | Status: DC
  Filled 2023-10-09: qty 90, 30d supply, fill #0

## 2023-10-09 MED ORDER — DAPAGLIFLOZIN PROPANEDIOL 10 MG PO TABS
10.0000 mg | ORAL_TABLET | Freq: Every day | ORAL | 0 refills | Status: AC
  Filled 2023-10-09: qty 30, 30d supply, fill #0

## 2023-10-09 MED ORDER — ALBUTEROL SULFATE HFA 108 (90 BASE) MCG/ACT IN AERS
2.0000 | INHALATION_SPRAY | RESPIRATORY_TRACT | 5 refills | Status: DC | PRN
  Filled 2023-10-09: qty 6.7, 17d supply, fill #0

## 2023-10-09 MED ORDER — AMLODIPINE BESYLATE 10 MG PO TABS
10.0000 mg | ORAL_TABLET | Freq: Every day | ORAL | 0 refills | Status: AC
  Filled 2023-10-09: qty 30, 30d supply, fill #0

## 2023-10-09 MED ORDER — CLOPIDOGREL BISULFATE 75 MG PO TABS
75.0000 mg | ORAL_TABLET | Freq: Every day | ORAL | 0 refills | Status: DC
  Filled 2023-10-09: qty 30, 30d supply, fill #0

## 2023-10-09 MED ORDER — SYMBICORT 160-4.5 MCG/ACT IN AERO
2.0000 | INHALATION_SPRAY | Freq: Two times a day (BID) | RESPIRATORY_TRACT | 5 refills | Status: DC
  Filled 2023-10-09: qty 10.2, 30d supply, fill #0

## 2023-10-09 MED ORDER — EZETIMIBE 10 MG PO TABS
10.0000 mg | ORAL_TABLET | Freq: Every day | ORAL | 0 refills | Status: AC
Start: 2023-10-09 — End: ?
  Filled 2023-10-09: qty 30, 30d supply, fill #0

## 2023-10-09 MED ORDER — INSULIN GLARGINE 100 UNIT/ML SOLOSTAR PEN
10.0000 [IU] | PEN_INJECTOR | Freq: Every day | SUBCUTANEOUS | 11 refills | Status: AC
  Filled 2023-10-09: qty 15, 140d supply, fill #0

## 2023-10-09 MED ORDER — DICLOFENAC SODIUM 1 % EX GEL
1.0000 | Freq: Two times a day (BID) | CUTANEOUS | 0 refills | Status: AC | PRN
  Filled 2023-10-09: qty 100, 15d supply, fill #0

## 2023-10-09 MED ORDER — DULOXETINE HCL 30 MG PO CPEP
30.0000 mg | ORAL_CAPSULE | Freq: Every day | ORAL | 0 refills | Status: DC
  Filled 2023-10-09: qty 30, 30d supply, fill #0

## 2023-10-09 MED ORDER — VENLAFAXINE HCL ER 150 MG PO CP24
150.0000 mg | ORAL_CAPSULE | Freq: Every day | ORAL | 0 refills | Status: DC
  Filled 2023-10-09: qty 30, 30d supply, fill #0

## 2023-10-09 MED ORDER — PANTOPRAZOLE SODIUM 40 MG PO TBEC
40.0000 mg | DELAYED_RELEASE_TABLET | Freq: Every day | ORAL | 0 refills | Status: AC
  Filled 2023-10-09: qty 30, 30d supply, fill #0

## 2023-10-09 MED ORDER — INSULIN PEN NEEDLE 32G X 4 MM MISC
0 refills | Status: DC
  Filled 2023-10-09: qty 100, 100d supply, fill #0

## 2023-10-09 MED ORDER — ROSUVASTATIN CALCIUM 40 MG PO TABS
40.0000 mg | ORAL_TABLET | Freq: Every day | ORAL | 0 refills | Status: AC
  Filled 2023-10-09: qty 30, 30d supply, fill #0

## 2023-10-09 MED ORDER — ACETAMINOPHEN 325 MG PO TABS
650.0000 mg | ORAL_TABLET | Freq: Every day | ORAL | Status: DC
  Administered 2023-10-09: 650 mg via ORAL
  Filled 2023-10-09: qty 2

## 2023-10-09 MED ORDER — MELATONIN 3 MG PO TABS
3.0000 mg | ORAL_TABLET | Freq: Every day | ORAL | 0 refills | Status: DC
  Filled 2023-10-09: qty 30, 30d supply, fill #0

## 2023-10-09 MED ORDER — SERTRALINE HCL 50 MG PO TABS
50.0000 mg | ORAL_TABLET | Freq: Every day | ORAL | 0 refills | Status: AC
Start: 2023-10-09 — End: ?
  Filled 2023-10-09: qty 30, 30d supply, fill #0

## 2023-10-09 MED ORDER — INSULIN GLARGINE 100 UNIT/ML ~~LOC~~ SOLN
SUBCUTANEOUS | 3 refills | Status: DC
  Filled 2023-10-09: qty 10, fill #0

## 2023-10-09 NOTE — Progress Notes (Signed)
 Patient ID: Erica [name], female   DOB: 07-24-1955, 68 y.o.   MRN: [id number] Pt has changed her mind and now wants home health. Will try to find agency to accept the referral. Aware PCS to increase her hours. She really wants 24/7 but this is not available unless goes to a NH.

## 2023-10-09 NOTE — Progress Notes (Addendum)
 PROGRESS NOTE   Subjective/Complaints:  Pt feeling well except for headaches she still has in evening. Tylenol  eventually helps a few hours after she takes.   ROS: Patient denies fever, rash, sore throat, blurred vision, dizziness, nausea, vomiting, diarrhea, cough, shortness of breath or chest pain, joint or back/neck pain, headache, or mood change.    Objective:   No results found.  No results for input(s): "WBC", "HGB", "HCT", "PLT" in the last 72 hours.  Recent Labs    10/06/23 1221  NA 140  K 4.6  CL 112*  CO2 21*  GLUCOSE 83  BUN 37*  CREATININE 2.36*  CALCIUM  8.7*     Intake/Output Summary (Last 24 hours) at 10/09/2023 1029 Last data filed at 10/09/2023 0745 Gross per 24 hour  Intake 708 ml  Output --  Net 708 ml        Physical Exam: Vital Signs Blood pressure (!) 152/84, pulse (!) 58, temperature 98.1 F (36.7 C), temperature source Oral, resp. rate 16, height 5\' 2"  (1.575 m), weight 74.6 kg, SpO2 98%.  Constitutional: No distress . Vital signs reviewed. HEENT: NCAT, EOMI, oral membranes moist Neck: supple Cardiovascular: RRR without murmur. No JVD    Respiratory/Chest: CTA Bilaterally without wheezes or rales. Normal effort    GI/Abdomen: BS +, non-tender, non-distended Ext: no clubbing, cyanosis, or edema Psych: pleasant and cooperative  Skin: No evidence of breakdown, no evidence of rash ENT- ceruminosis Right ear canal, no erythema or tenderness with exam , TM not visualized  Neurologic: Oriented x 3, motor strength is 4/5 in bilateral deltoid, bicep, tricep, grip, hip flexor, knee extensors, ankle dorsiflexor and plantar flexor Sensory exam Reduced sensation left arm and right V2, V3 as well as temporal scalp area  Cerebellar exam Mild dysmetria FNF bilateral.   Prior neuro assessment is c/w today's exam 10/09/2023.  Musculoskeletal: Full range of motion in all 4 extremities. No joint  swelling  Assessment/Plan: 1. Functional deficits which require 3+ hours per day of interdisciplinary therapy in a comprehensive inpatient rehab setting. Physiatrist is providing close team supervision and 24 hour management of active medical problems listed below. Physiatrist and rehab team continue to assess barriers to discharge/monitor patient progress toward functional and medical goals  Care Tool:  Bathing    Body parts bathed by patient: Right arm, Left arm, Chest, Abdomen, Front perineal area, Right upper leg, Left upper leg, Right lower leg, Left lower leg, Face         Bathing assist Assist Level: Supervision/Verbal cueing     Upper Body Dressing/Undressing Upper body dressing   What is the patient wearing?: Pull over shirt    Upper body assist Assist Level: Supervision/Verbal cueing    Lower Body Dressing/Undressing Lower body dressing      What is the patient wearing?: Underwear/pull up, Pants     Lower body assist Assist for lower body dressing: Supervision/Verbal cueing     Toileting Toileting    Toileting assist Assist for toileting: Supervision/Verbal cueing     Transfers Chair/bed transfer  Transfers assist     Chair/bed transfer assist level: Contact Guard/Touching assist     Locomotion Ambulation   Ambulation  assist      Assist level: Contact Guard/Touching assist Assistive device: Cane-straight Max distance: 170 ft   Walk 10 feet activity   Assist     Assist level: Supervision/Verbal cueing Assistive device: Cane-straight   Walk 50 feet activity   Assist    Assist level: Contact Guard/Touching assist Assistive device: Cane-straight    Walk 150 feet activity   Assist    Assist level: Contact Guard/Touching assist Assistive device: Cane-straight    Walk 10 feet on uneven surface  activity   Assist     Assist level: Contact Guard/Touching assist Assistive device: Cane-straight    Wheelchair     Assist Is the patient using a wheelchair?: Yes Type of Wheelchair: Manual    Wheelchair assist level: Dependent - Patient 0% Max wheelchair distance: 175 ft    Wheelchair 50 feet with 2 turns activity    Assist        Assist Level: Dependent - Patient 0%   Wheelchair 150 feet activity     Assist      Assist Level: Dependent - Patient 0%   Blood pressure (!) 152/84, pulse (!) 58, temperature 98.1 F (36.7 C), temperature source Oral, resp. rate 16, height 5\' 2"  (1.575 m), weight 74.6 kg, SpO2 98%.  Medical Problem List and Plan: 1. 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  Loop recorder placed 2022             -patient may shower             -ELOS/Goals: 10/10/2023 Sup / mod I goals 2.  Antithrombotics: -DVT/anticoagulation:  Pharmaceutical: Heparin  may d/c amb >336ft             -antiplatelet therapy: Aspirin  81 mg daily and Plavix  75 mg day x 3 weeks then Plavix  alone 3. Pain Management: Neurontin  100 mg  daily as needed, Tylenol  as needed  4/19- added Fioricet prn for HA's  4/28 no fioricet on MAR. Will schedule 650mg  tylenol  at 1800 daily  4. Mood/Behavior/Sleep: Effexor  150 mg daily, Zoloft  50 mg daily             -antipsychotic agents: N/A 5. Neuropsych/cognition: This patient is usually capable of making decisions on her own behalf. 6. Skin/Wound Care: Routine skin checks 7. Fluids/Electrolytes/Nutrition: Routine in and outs with follow-up chemistries 8.  CKD stage IIIb.  Creatinine baseline 1.27-2.0.  Follow-up chemistries    Latest Ref Rng & Units 10/06/2023   12:21 PM 09/29/2023    5:33 AM 09/28/2023    5:13 PM  BMP  Glucose 70 - 99 mg/dL 83  409    BUN 8 - 23 mg/dL 37  35    Creatinine 8.11 - 1.00 mg/dL 9.14  7.82  9.56   Sodium 135 - 145 mmol/L 140  139    Potassium  3.5 - 5.1 mmol/L 4.6  4.6    Chloride 98 - 111 mmol/L 112  112    CO2 22 - 32 mmol/L 21  19    Calcium  8.9 - 10.3 mg/dL 8.7  8.8     At baseline--f/u as outpt 9.  Diabetes mellitus.  Hemoglobin A1c 5.6.  Currently SSI.  Prior to admission patient on Lantus  insulin  30 units daily as well as Farxiga 10 mg daily and resume as needed  CBG (last 3)  Recent Labs  10/08/23 1154 10/08/23 1642 10/09/23 0606  GLUCAP 167* 209* 121*  Resume Lantus  10U at bedtime , controlled 4/25 GFR 29 on 4/18, recheck if >30, may resume Farxiga start at 5mg   4/28 fair control except for reading at 1600 yesterday 10.  COPD/history of tobacco use as well as Idiopathic Pulmonary Fibrosis.  Continue inhalers as advised as well as Pirfenidone  3 times daily.  F/u with Dr Bertrum Brodie , pulmonary  11.  Hyperlipidemia.  Crestor /Zetia  12.  Essential  hypertension.  Patient on Norvasc  10 mg daily, hydralazine  25 mg every 8 hours prior to admission.  Resume as needed Vitals:   10/08/23 1523 10/09/23 0608  BP: 138/76 (!) 152/84  Pulse: 66 (!) 58  Resp: 17 16  Temp: 98 F (36.7 C) 98.1 F (36.7 C)  SpO2: 100% 98%  On amlodipine  5mg  per day increase to 10mg  4/25, may take 2-3 more days to see full effect Pt does have early am HTN, consider adding Hydralazine   4/28 Borderline control to elevated in am. Norvasc  increased a few days ago. Observe only for now. Treat pm h/a 13.  History of rectal cancer.  Follow-up outpatient Dr. Maryalice Smaller 14.  GERD.  Protonix   15. Daily HA's- actually more characteristic of CVA related dysesthetic pain.  Will change gabapentin  to 100mg  TID (was on prn) , may d/c topamax   16. Insomnia  4/23-overall improved Trazodone  50 mg at bedtime for sleep  17.  Ceruminosis completed debrox  18.  Dysphagia- mainly poor sensation in Right side throat due to thalamic infarct Left side, will enc po intake, reassure that swallowing function is intact   -on regular diet. Takes extra time when she is chewing and  swallowing  LOS: 11 days A FACE TO FACE EVALUATION WAS PERFORMED  Rawland Caddy 10/09/2023, 10:29 AM

## 2023-10-09 NOTE — Plan of Care (Signed)
 Problem: RH Swallowing Goal: LTG Patient will consume least restrictive diet using compensatory strategies with assistance (SLP) Description: LTG:  Patient will consume least restrictive diet using compensatory strategies with assistance (SLP) Outcome: Completed/Met   Problem: RH Expression Communication Goal: LTG Patient will increase speech intelligibility (SLP) Description: LTG: Patient will increase speech intelligibility at word/phrase/conversation level with cues, % of the time (SLP) Outcome: Completed/Met   Problem: RH Problem Solving Goal: LTG Patient will demonstrate problem solving for (SLP) Description: LTG:  Patient will demonstrate problem solving for basic/complex daily situations with cues  (SLP) Outcome: Completed/Met   Problem: RH Memory Goal: LTG Patient will use memory compensatory aids to (SLP) Description: LTG:  Patient will use memory compensatory aids to recall biographical/new, daily complex information with cues (SLP) Outcome: Completed/Met

## 2023-10-09 NOTE — Progress Notes (Signed)
 Physical Therapy Discharge Summary  Patient Details  Name: Erica [name] MRN: [id number] Date of Birth: 27-May-1956  Date of Discharge from PT service:October 09, 2023  Today's Date: 10/09/2023 PT Individual Time: 1032-1130 PT Individual Time Calculation (min): 58 min    Patient has met {NUMBERS 0-12:18577} of {NUMBERS 0-12:18577} long term goals due to {due YN:8295621}.  Patient to discharge at [facility] level {LOA:3049010}.   Patient's care partner {care partner:3041650} to provide the necessary {assistance:3041652} assistance at discharge.  Reasons goals not met: ***  Recommendation:  Patient will benefit from ongoing skilled PT services in {setting:3041680} to continue to advance safe functional mobility, address ongoing impairments in ***, and minimize fall risk.  Equipment: {equipment:3041657}  Reasons for discharge: {Reason for discharge:3049018}  Patient/family agrees with progress made and goals achieved: {Pt/Family agree with progress/goals:3049020}  PT Discharge Precautions/Restrictions   Vital Signs   Pain   Pain Interference   Vision/Perception     Cognition Overall Cognitive Status: History of cognitive impairments - at baseline Arousal/Alertness: Awake/alert Orientation Level: Oriented X4 Year: 2025 Month: April Day of Week: Correct Attention: Sustained;Selective Sustained Attention: Appears intact Selective Attention: Impaired Selective Attention Impairment: Verbal complex;Functional complex Memory: Impaired Memory Impairment: Retrieval deficit;Decreased recall of new information;Decreased short term memory Decreased Short Term Memory: Verbal basic;Functional basic Awareness: Impaired Awareness Impairment: Anticipatory impairment Problem Solving: Impaired Problem Solving Impairment: Verbal complex;Functional complex;Verbal basic Safety/Judgment: Appears intact Sensation   Motor     Mobility   Locomotion     Trunk/Postural Assessment      Balance   Extremity Assessment  RUE Assessment RUE Assessment: Within Functional Limits General Strength Comments: mild strength deficit 2/2 deconditioning, however 4/5 WFL, grip 48 lbs         Donne Gage 10/09/2023, 5:58 PM

## 2023-10-09 NOTE — Progress Notes (Signed)
 Inpatient Rehabilitation Care Coordinator Discharge Note   Patient Details  Name: Erica [name] MRN: [id number] Date of Birth: 05-04-1956   Discharge location: HOME WITH SISTER AND SON CHECKING ON AND PCP AIDE  Length of Stay: 12 DAYS  Discharge activity level: MOD/I-SUPERVISION  Home/community participation: ACTIVE  Patient response JX:BJYNWG Literacy - How often do you need to have someone help you when you read instructions, pamphlets, or other written material from your doctor or pharmacy?: Never  Patient response NF:AOZHYQ Isolation - How often do you feel lonely or isolated from those around you?: Rarely  Services provided included: MD, RD, PT, OT, SLP, RN, CM, Pharmacy, Neuropsych, SW  Financial Services:  Field seismologist Utilized: Private Insurance [facility] MEDICARE DUAL COMPLETE  Choices offered to/list presented to: PT AND SISTER  Follow-up services arranged:  Outpatient, Patient/Family request agency HH/DME    PT HAS REQUESTED HOME HEALTH NOW AND HAVE FOUND [facility] PT OT SP TO TAKE REFERRAL. WILL REACH OUT TO PT TO SET UP WHEN COMING OUT   HH/DME Requested Agency: [facility]  HAS ALL DME FROM PREVIOUS ADMISSIONS. PCS AIDE TO RESUME AND TRYING TO GET MORE HOURS FOR HER VIA NEW VISIONS Patient response to transportation need: Is the patient able to respond to transportation needs?: Yes In the past 12 months, has lack of transportation kept you from medical appointments or from getting medications?: No In the past 12 months, has lack of transportation kept you from meetings, work, or from getting things needed for daily living?: No   Patient/Family verbalized understanding of follow-up arrangements:  Yes  Individual responsible for coordination of the follow-up plan: SELF AND [name]-SISTER [phone]  Confirmed correct DME delivered: Mardell Shade 10/09/2023    Comments (or additional information):PT DID WELL AND GOT BACK TO LEVEL WAS AT HOME PRIOR  TO ADMISSION. SHE STILL HAS NUMBNESS OF HER FACE WHICH BOTHERS HER. [name] WAS HERE AND OBSERVED IN THERAPIES  Summary of Stay    Date/Time Discharge Planning CSW  10/04/23 [phone] HOme with sister, son and PCS providing assist, there will be hours she will be alone and was prior to admission. Was going to OP therapies PTA RGD       Kayleeann Huxford G

## 2023-10-09 NOTE — Progress Notes (Signed)
 Speech Language Pathology Discharge Summary  Patient Details  Name: FLOREAN KERCHEVAL MRN: [id number] Date of Birth: 05-Jan-1956  Date of Discharge from SLP service:October 09, 2023  Today's Date: 10/09/2023 SLP Individual Time: 1430-1530 SLP Individual Time Calculation (min): 60 min   Skilled Therapeutic Interventions: Pt greeted at bedside. She was sitting EOB and eager for tx tasks targeting cognition and speech production. Of note, throughout conversational tasks of varying complexity, she was able to maintain 100% intelligibility independently. She required only supervisionA to complete a decoding task targeting working memory, attention, and problem solving. Pt noted to require additional processing time, though pt noted to receive personal phone call during task and reported disrupted concentration because of this. Additionally, she completed a 3 step memory task w/ minA. In conversation re overall progress and continued therapies upon d/c, pt reported continued success w/ tolerating a regular diet/thin liquids. At the end of tx tasks, she was left in bed with the alarm set and call light within reach. See d/c summary below for more info.    Patient has met 4 of 4 long term goals.  Patient to discharge at [facility] level.  Reasons goals not met: n/a   Clinical Impression/Discharge Summary:  Pt has made good progress this stay, as evidenced by meeting 4/4 LTGs and demonstrating improved speech intelligibility, problem solving, and use of compensatory memory strategies. She continues to require minA overall at this time for cognition and tolerates regular textures/thin liquids @ modI. Pt/family education complete. She would benefit from continued ST to target remaining mild cognitive deficits, facilitate return to prev roles/responsibilities, and maximize pt independence.     Care Partner:  Caregiver Able to Provide Assistance: Yes  Type of Caregiver Assistance:  Cognitive;Physical  Recommendation:  Outpatient SLP;24 hour supervision/assistance  Rationale for SLP Follow Up: Maximize cognitive function and independence;Maximize functional communication;Reduce caregiver burden   Equipment: n/a   Reasons for discharge: Discharged from hospital   Patient/Family Agrees with Progress Made and Goals Achieved: Yes    Rozell Cornet 10/09/2023, 3:44 PM

## 2023-10-09 NOTE — Plan of Care (Signed)
  Problem: RH Simple Meal Prep Goal: LTG Patient will perform simple meal prep w/assist (OT) Description: LTG: Patient will perform simple meal prep with assistance, with/without cues (OT). Outcome: Not Met (add Reason) Note: Supervision recommended for safety d/t Pt's cognitive deficits.    Problem: RH Balance Goal: LTG Patient will maintain dynamic standing with ADLs (OT) Description: LTG:  Patient will maintain dynamic standing balance with assist during activities of daily living (OT)  Outcome: Completed/Met   Problem: Sit to Stand Goal: LTG:  Patient will perform sit to stand in prep for activites of daily living with assistance level (OT) Description: LTG:  Patient will perform sit to stand in prep for activites of daily living with assistance level (OT) Outcome: Completed/Met   Problem: RH Grooming Goal: LTG Patient will perform grooming w/assist,cues/equip (OT) Description: LTG: Patient will perform grooming with assist, with/without cues using equipment (OT) Outcome: Completed/Met   Problem: RH Bathing Goal: LTG Patient will bathe all body parts with assist levels (OT) Description: LTG: Patient will bathe all body parts with assist levels (OT) Outcome: Completed/Met   Problem: RH Dressing Goal: LTG Patient will perform upper body dressing (OT) Description: LTG Patient will perform upper body dressing with assist, with/without cues (OT). Outcome: Completed/Met Goal: LTG Patient will perform lower body dressing w/assist (OT) Description: LTG: Patient will perform lower body dressing with assist, with/without cues in positioning using equipment (OT) Outcome: Completed/Met   Problem: RH Toileting Goal: LTG Patient will perform toileting task (3/3 steps) with assistance level (OT) Description: LTG: Patient will perform toileting task (3/3 steps) with assistance level (OT)  Outcome: Completed/Met   Problem: RH Functional Use of Upper Extremity Goal: LTG Patient will use  RT/LT upper extremity as a (OT) Description: LTG: Patient will use right/left upper extremity as a stabilizer/gross assist/diminished/nondominant/dominant level with assist, with/without cues during functional activity (OT) Outcome: Completed/Met   Problem: RH Toilet Transfers Goal: LTG Patient will perform toilet transfers w/assist (OT) Description: LTG: Patient will perform toilet transfers with assist, with/without cues using equipment (OT) Outcome: Completed/Met   Problem: RH Tub/Shower Transfers Goal: LTG Patient will perform tub/shower transfers w/assist (OT) Description: LTG: Patient will perform tub/shower transfers with assist, with/without cues using equipment (OT) Outcome: Completed/Met   Problem: RH Memory Goal: LTG Patient will demonstrate ability for day to day recall/carry over during activities of daily living with assistance level (OT) Description: LTG:  Patient will demonstrate ability for day to day recall/carry over during activities of daily living with assistance level (OT). Outcome: Completed/Met

## 2023-10-09 NOTE — Progress Notes (Addendum)
 Occupational Therapy Discharge Summary  Patient Details  Name: Erica [name] MRN: [id number] Date of Birth: 1955/09/24  Date of Discharge from OT service:October 09, 2023  Today's Date: 10/09/2023 OT Individual Time: 7829-5621 OT Individual Time Calculation (min): 71 min    Patient has met 10 of 11 long term goals due to improved activity tolerance, improved balance, postural control, ability to compensate for deficits, functional use of  LEFT upper and LEFT lower extremity, improved attention, improved awareness, and improved coordination.  Patient to discharge at overall Modified Independent level for BADLs and supervision for IADLs. Supervision recommended for IADLs.  Patient's care partner is independent to provide the necessary physical and cognitive assistance at discharge.    Reasons goals not met: Pt did not meet goal for simple meal preparation as supervision is recommended for safety d/t Pt's cognitive deficits.   Recommendation:  Patient will benefit from ongoing skilled OT services in home health setting to continue to advance functional skills in the area of BADL, iADL, and Reduce care partner burden.  Equipment: No equipment provided  Reasons for discharge: treatment goals met and discharge from hospital  Patient/family agrees with progress made and goals achieved: Yes  OT Discharge Skilled Therapeutic Interventions/Progress Updates:  Pt received sitting EOB presenting to be in good spirits receptive to skilled OT session reporting 0/10 pain- OT offering intermittent rest breaks, repositioning, and therapeutic support to optimize participation in therapy session. Pt reporting fatigue, however participating well throughout session. Pt requesting to take shower. Pt completed shower level BADLs at levels listed below this session with verbal cues required for safety during shower transfers for safety. Majority of U/LB bathing completed in seated position on TTB with supervision and  U/LB dressing completed EOB mod I using SPC. Engaged Pt in completing higher level meal preparation IADL task in ADL apartment with verbal cues required for positioning, SPC usage, and for safety techniques when transporting items. Pt with appropriate activity pacing and application of energy conservation and fall prevention techniques during BADLs, however she requires verbal cues for more complex IADLs. Supervision recommended for IADLs. Remainder of session focused on completing grad day activities and providing education on d/c plans. Pt was left resting EOB with call bell in reach, bed alarm on, and all needs met.   Precautions/Restrictions  Precautions Precautions: Fall Recall of Precautions/Restrictions: Intact Precaution/Restrictions Comments: h/o L sided weakness from previous stroke Restrictions Weight Bearing Restrictions Per Provider Order: No Pain  0/10 ADL ADL Eating: Set up Where Assessed-Eating: Edge of bed Grooming: Setup Where Assessed-Grooming: Sitting at sink Upper Body Bathing: Supervision/safety Where Assessed-Upper Body Bathing: Shower Lower Body Bathing: Supervision/safety Where Assessed-Lower Body Bathing: Shower Upper Body Dressing: Modified independent (Device) Where Assessed-Upper Body Dressing: Edge of bed Lower Body Dressing: Modified independent Where Assessed-Lower Body Dressing: Edge of bed Toileting: Modified independent Where Assessed-Toileting: Teacher, adult education: Engineer, agricultural Method: Proofreader: Engineer, technical sales: Close supervison Web designer Method: Ship broker: Insurance underwriter: Close supervision Film/video editor Method: Designer, industrial/product: Sales promotion account executive Baseline Vision/History: 1 Wears glasses Patient Visual Report: No change from baseline Vision Assessment?: No apparent visual  deficits Perception  Perception: Within Functional Limits Praxis Praxis: Impaired Praxis Impairment Details: Motor planning Cognition Cognition Overall Cognitive Status: History of cognitive impairments - at baseline Arousal/Alertness: Awake/alert Orientation Level: Person;Place;Situation Person: Oriented Place: Oriented Situation: Oriented Memory: Impaired Memory Impairment: Storage deficit;Retrieval deficit Decreased Short Term Memory: Verbal  basic;Functional basic Attention: Sustained;Alternating Sustained Attention: Appears intact Selective Attention: Appears intact Awareness: Impaired Awareness Impairment: Anticipatory impairment Problem Solving: Impaired Executive Function:  (intact forBADLs, supervision reccomended for IADLs and bathing) Safety/Judgment: Appears intact Brief Interview for Mental Status (BIMS) Repetition of Three Words (First Attempt): 3 Temporal Orientation: Year: Correct Temporal Orientation: Month: Accurate within 5 days Temporal Orientation: Day: Correct Recall: "Sock": Yes, no cue required Recall: "Blue": Yes, no cue required Recall: "Bed": No, could not recall BIMS Summary Score: 13 Sensation Sensation Light Touch: Appears Intact Hot/Cold: Appears Intact Proprioception: Appears Intact Stereognosis: Not tested Coordination Gross Motor Movements are Fluid and Coordinated: No Fine Motor Movements are Fluid and Coordinated: No Coordination and Movement Description: mild instability in standing with decreased step length and L knee instability Finger Nose Finger Test: effortful and slowed on L Motor  Motor Motor: Other (comment) (mild L hemiparesis with residual L hemiparesis) Motor - Discharge Observations: mild balance and L LE strenght deficits, improvement from initial eval Mobility  Bed Mobility Bed Mobility: Rolling Right;Rolling Left;Supine to Sit;Sit to Supine Rolling Right: Independent with assistive device Rolling Left:  Independent with assistive device Supine to Sit: Independent with assistive device Sit to Supine: Independent with assistive device Transfers Sit to Stand: Independent with assistive device Stand to Sit: Independent with assistive device  Trunk/Postural Assessment  Cervical Assessment Cervical Assessment: Exceptions to [facility] (forward head) Thoracic Assessment Thoracic Assessment: Exceptions to [facility] (mildrounded shoulders) Lumbar Assessment Lumbar Assessment: Exceptions to [facility] Postural Control Postural Control: Deficits on evaluation Righting Reactions: delayed, improved from inital eval Protective Responses: delayed, improved from initial eval  Balance Balance Balance Assessed: Yes Static Sitting Balance Static Sitting - Balance Support: Feet supported;No upper extremity supported Static Sitting - Level of Assistance: 7: Independent Dynamic Sitting Balance Dynamic Sitting - Balance Support: Feet supported;During functional activity Dynamic Sitting - Level of Assistance: 6: Modified independent (Device/Increase time) Static Standing Balance Static Standing - Balance Support: During functional activity;Right upper extremity supported Static Standing - Level of Assistance: 6: Modified independent (Device/Increase time) Dynamic Standing Balance Dynamic Standing - Balance Support: Right upper extremity supported;During functional activity Dynamic Standing - Level of Assistance: 5: Stand by assistance;6: Modified independent (Device/Increase time) Extremity/Trunk Assessment RUE Assessment RUE Assessment: Within Functional Limits General Strength Comments: mild strength deficit 2/2 deconditioning, however 4/5 WFL, grip 48 lbs LUE Assessment LUE Assessment: Exceptions to [facility] General Strength Comments: mild strength deficit with decreased shoulder flexion and residual LUE weakness 4-/5 overall, grip 47 lbs   Geoffery Kiel 10/09/2023, 1:47 PM

## 2023-10-10 ENCOUNTER — Other Ambulatory Visit (HOSPITAL_COMMUNITY): Payer: Self-pay

## 2023-10-10 DIAGNOSIS — E1169 Type 2 diabetes mellitus with other specified complication: Secondary | ICD-10-CM

## 2023-10-10 LAB — GLUCOSE, CAPILLARY: Glucose-Capillary: 125 mg/dL — ABNORMAL HIGH (ref 70–99)

## 2023-10-10 MED ORDER — INSULIN PEN NEEDLE 32G X 4 MM MISC
0 refills | Status: DC
  Filled 2023-10-10: qty 100, fill #0

## 2023-10-10 NOTE — Progress Notes (Signed)
Patient discharged to home accompanied by family

## 2023-10-10 NOTE — Plan of Care (Signed)
  Problem: RH Balance Goal: LTG Patient will maintain dynamic standing balance (PT) Description: LTG:  Patient will maintain dynamic standing balance with assistance during mobility activities (PT) Flowsheets (Taken 09/29/2023 1748) LTG: Pt will maintain dynamic standing balance during mobility activities with:: Independent with assistive device    Problem: Sit to Stand Goal: LTG:  Patient will perform sit to stand with assistance level (PT) Description: LTG:  Patient will perform sit to stand with assistance level (PT) Flowsheets (Taken 09/29/2023 1748) LTG: PT will perform sit to stand in preparation for functional mobility with assistance level: Independent   Problem: RH Bed Mobility Goal: LTG Patient will perform bed mobility with assist (PT) Description: LTG: Patient will perform bed mobility with assistance, with/without cues (PT). Flowsheets (Taken 09/29/2023 1748) LTG: Pt will perform bed mobility with assistance level of: Independent   Problem: RH Bed to Chair Transfers Goal: LTG Patient will perform bed/chair transfers w/assist (PT) Description: LTG: Patient will perform bed to chair transfers with assistance (PT). Flowsheets (Taken 09/29/2023 1748) LTG: Pt will perform Bed to Chair Transfers with assistance level: Independent with assistive device    Problem: RH Car Transfers Goal: LTG Patient will perform car transfers with assist (PT) Description: LTG: Patient will perform car transfers with assistance (PT). Flowsheets (Taken 09/29/2023 1748) LTG: Pt will perform car transfers with assist:: Independent with assistive device    Problem: RH Furniture Transfers Goal: LTG Patient will perform furniture transfers w/assist (OT/PT) Description: LTG: Patient will perform furniture transfers  with assistance (OT/PT). Flowsheets (Taken 09/29/2023 1748) LTG: Pt will perform furniture transfers with assist:: Independent with assistive device    Problem: RH Ambulation Goal: LTG Patient  will ambulate in home environment (PT) Description: LTG: Patient will ambulate in home environment, # of feet with assistance (PT). Flowsheets (Taken 09/29/2023 1748) LTG: Pt will ambulate in home environ  assist needed:: Independent with assistive device LTG: Ambulation distance in home environment: up to 50 ft per bout using LRAD Goal: LTG Patient will ambulate in community environment (PT) Description: LTG: Patient will ambulate in community environment, # of feet with assistance (PT). Flowsheets (Taken 09/29/2023 1748) LTG: Pt will ambulate in community environ  assist needed:: Supervision/Verbal cueing LTG: Ambulation distance in community environment: more than 300 ft using LRAD   Problem: RH Stairs Goal: LTG Patient will ambulate up and down stairs w/assist (PT) Description: LTG: Patient will ambulate up and down # of stairs with assistance (PT) Flowsheets (Taken 09/29/2023 1748) LTG: Pt will ambulate up/down stairs assist needed:: Supervision/Verbal cueing LTG: Pt will  ambulate up and down number of stairs: at least 4 steps using HRs in order to mobilize in community

## 2023-10-10 NOTE — Progress Notes (Signed)
 Inpatient Rehabilitation Discharge Medication Review by a Pharmacist  A complete drug regimen review was completed for this patient to identify any potential clinically significant medication issues.  High Risk Drug Classes Is patient taking? Indication by Medication  Antipsychotic No   Anticoagulant No   Antibiotic No   Opioid No   Antiplatelet Yes Plavix - cva ppx  Hypoglycemics/insulin  Yes Farxiga, insulin - T2DM  Vasoactive Medication Yes Norvasc - HTN Hydralazine - HTN  Chemotherapy No   Other Yes Zetia , crestor - HLD Zyrtec - allergies Neurontin - neuropathic pain Melatonin- sleep Protonix - GERD Zoloft , Effexor - MDD Pirfenidone - IPF     Type of Medication Issue Identified Description of Issue Recommendation(s)  Drug Interaction(s) (clinically significant)     Duplicate Therapy     Allergy     No Medication Administration End Date     Incorrect Dose     Additional Drug Therapy Needed     Significant med changes from prior encounter (inform family/care partners about these prior to discharge).    Other       Clinically significant medication issues were identified that warrant physician communication and completion of prescribed/recommended actions by midnight of the next day:  No   Time spent performing this drug regimen review (minutes):  30   Phuong Hillary BS, PharmD, BCPS Clinical Pharmacist 10/10/2023 8:52 AM  Contact: [phone] after 3 PM  "Be curious, not judgmental..." -Rumalda Counter

## 2023-10-10 NOTE — Progress Notes (Signed)
 PROGRESS NOTE   Subjective/Complaints:  Feels ok this am , discussed no driving   ROS: Patient denies CP, SOB, N/V/D.    Objective:   No results found.  No results for input(s): "WBC", "HGB", "HCT", "PLT" in the last 72 hours.  No results for input(s): "NA", "K", "CL", "CO2", "GLUCOSE", "BUN", "CREATININE", "CALCIUM " in the last 72 hours.    Intake/Output Summary (Last 24 hours) at 10/10/2023 0752 Last data filed at 10/10/2023 1610 Gross per 24 hour  Intake 712 ml  Output --  Net 712 ml        Physical Exam: Vital Signs Blood pressure (!) 165/89, pulse 71, temperature (!) 97.4 F (36.3 C), temperature source Oral, resp. rate 18, height 5\' 2"  (1.575 m), weight 74.6 kg, SpO2 100%.   General: No acute distress Mood and affect are appropriate Heart: Regular rate and rhythm no rubs murmurs or extra sounds Lungs: Clear to auscultation, breathing unlabored, no rales or wheezes Abdomen: Positive bowel sounds, soft nontender to palpation, nondistended Extremities: No clubbing, cyanosis, or edema  Neurologic: Oriented x 3, motor strength is 4/5 in bilateral deltoid, bicep, tricep, grip, hip flexor, knee extensors, ankle dorsiflexor and plantar flexor Sensory exam Reduced sensation left arm and right V2, V3 as well as temporal scalp area  Cerebellar exam Mild dysmetria FNF bilateral.   Prior neuro assessment is c/w today's exam 10/10/2023.  Musculoskeletal: Full range of motion in all 4 extremities. No joint swelling  Assessment/Plan: 1. Functional deficits due to acute left thalamic infarct, prior Right thalamic infarct Stable for D/C today F/u PCP in 2-3 weeks F/u PM&R 3-4 weeks F/u neuro 1-2 mo No driving discussed See D/C summary See D/C instructions   Care Tool:  Bathing    Body parts bathed by patient: Right arm, Left arm, Chest, Abdomen, Front perineal area, Right upper leg, Left upper leg, Right lower  leg, Left lower leg, Face, Buttocks         Bathing assist Assist Level: Supervision/Verbal cueing     Upper Body Dressing/Undressing Upper body dressing   What is the patient wearing?: Pull over shirt    Upper body assist Assist Level: Independent with assistive device    Lower Body Dressing/Undressing Lower body dressing      What is the patient wearing?: Underwear/pull up, Pants     Lower body assist Assist for lower body dressing: Independent with assitive device     Toileting Toileting    Toileting assist Assist for toileting: Independent with assistive device     Transfers Chair/bed transfer  Transfers assist     Chair/bed transfer assist level: Independent with assistive device Chair/bed transfer assistive device: Forensic psychologist assist      Assist level: Contact Guard/Touching assist Assistive device: Cane-straight Max distance: 170 ft   Walk 10 feet activity   Assist     Assist level: Supervision/Verbal cueing Assistive device: Cane-straight   Walk 50 feet activity   Assist    Assist level: Contact Guard/Touching assist Assistive device: Cane-straight    Walk 150 feet activity   Assist    Assist level: Contact Guard/Touching assist Assistive device:  Cane-straight    Walk 10 feet on uneven surface  activity   Assist     Assist level: Contact Guard/Touching assist Assistive device: Cane-straight   Wheelchair     Assist Is the patient using a wheelchair?: Yes Type of Wheelchair: Manual    Wheelchair assist level: Dependent - Patient 0% Max wheelchair distance: 175 ft    Wheelchair 50 feet with 2 turns activity    Assist        Assist Level: Dependent - Patient 0%   Wheelchair 150 feet activity     Assist      Assist Level: Dependent - Patient 0%   Blood pressure (!) 165/89, pulse 71, temperature (!) 97.4 F (36.3 C), temperature source Oral, resp. rate  18, height 5\' 2"  (1.575 m), weight 74.6 kg, SpO2 100%.  Medical Problem List and Plan: 1. 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  Loop recorder placed 2022         Stable for d/c 2.  Antithrombotics: -DVT/anticoagulation:  Pharmaceutical: Heparin  may d/c amb >344ft             -antiplatelet therapy: Aspirin  81 mg daily and Plavix  75 mg day x 3 weeks then Plavix  alone 3. Pain Management: Neurontin  100 mg  daily as needed, Tylenol  as needed  4/19- added Fioricet prn for HA's  4/28 no fioricet on MAR. Will schedule 650mg  tylenol  at 1800 daily  4. Mood/Behavior/Sleep: Effexor  150 mg daily, Zoloft  50 mg daily             -antipsychotic agents: N/A 5. Neuropsych/cognition: This patient is usually capable of making decisions on her own behalf. 6. Skin/Wound Care: Routine skin checks 7. Fluids/Electrolytes/Nutrition: Routine in and outs with follow-up chemistries 8.  CKD stage IIIb.  Creatinine baseline 1.27-2.0.  Follow-up chemistries    Latest Ref Rng & Units 10/06/2023   12:21 PM 09/29/2023    5:33 AM 09/28/2023    5:13 PM  BMP  Glucose 70 - 99 mg/dL 83  161    BUN 8 - 23 mg/dL 37  35    Creatinine 0.96 - 1.00 mg/dL 0.45  4.09  8.11   Sodium 135 - 145 mmol/L 140  139    Potassium 3.5 - 5.1 mmol/L 4.6  4.6    Chloride 98 - 111 mmol/L 112  112    CO2 22 - 32 mmol/L 21  19    Calcium  8.9 - 10.3 mg/dL 8.7  8.8     At baseline--f/u as outpt 9.  Diabetes mellitus.  Hemoglobin A1c 5.6.  Currently SSI.  Prior to admission patient on Lantus  insulin  30 units daily as well as Farxiga 10 mg daily and resume as needed  CBG (last 3)  Recent Labs    10/09/23 1644 10/09/23 2057 10/10/23 0622  GLUCAP 187* 241* 125*  GFR <30 holding Elvina Hammers, PCP to address as OP  10.  COPD/history of tobacco use as well as Idiopathic Pulmonary Fibrosis.   Continue inhalers as advised as well as Pirfenidone  3 times daily.  F/u with Dr Bertrum Brodie , pulmonary  11.  Hyperlipidemia.  Crestor /Zetia  12.  Essential  hypertension.  Patient on Norvasc  10 mg daily, hydralazine  25 mg every 8 hours prior to admission.  Resume as needed Vitals:   10/09/23 1935 10/10/23 0559  BP: 130/85 (!) 165/89  Pulse: 71  Resp: 18 18  Temp: 97.9 F (36.6 C) (!) 97.4 F (36.3 C)  SpO2: 100% 100%  On amlodipine  5mg  per day increase to 10mg  4/25, may need Hydralazine  resumed as OP, defer to PCP 13.  History of rectal cancer.  Follow-up outpatient Dr. Maryalice Smaller 14.  GERD.  Protonix   15. Daily HA's- actually more characteristic of CVA related dysesthetic pain.  Will change gabapentin  to 100mg  TID (was on prn) , may d/c topamax   16. Insomnia  4/23-overall improved Trazodone  50 mg at bedtime for sleep  17.  Ceruminosis completed debrox  18.  Dysphagia- mainly poor sensation in Right side throat due to thalamic infarct Left side, will enc po intake, reassure that swallowing function is intact   -on regular diet. Takes extra time when she is chewing and swallowing  LOS: 12 days A FACE TO FACE EVALUATION WAS PERFORMED  Erica [name] 10/10/2023, 7:52 AM

## 2023-10-11 ENCOUNTER — Encounter: Admitting: Occupational Therapy

## 2023-10-11 ENCOUNTER — Encounter

## 2023-10-11 ENCOUNTER — Ambulatory Visit: Admitting: Physical Therapy

## 2023-10-16 ENCOUNTER — Encounter: Admitting: Occupational Therapy

## 2023-10-16 ENCOUNTER — Ambulatory Visit: Admitting: Physical Therapy

## 2023-10-16 ENCOUNTER — Encounter: Admitting: Speech Pathology

## 2023-10-16 ENCOUNTER — Ambulatory Visit (INDEPENDENT_AMBULATORY_CARE_PROVIDER_SITE_OTHER): Payer: Medicare HMO

## 2023-10-16 DIAGNOSIS — I639 Cerebral infarction, unspecified: Secondary | ICD-10-CM | POA: Diagnosis not present

## 2023-10-16 LAB — CUP PACEART REMOTE DEVICE CHECK
Date Time Interrogation Session: 20250505103910
Implantable Pulse Generator Implant Date: 20220706
Pulse Gen Model: 436066
Pulse Gen Serial Number: 94069553

## 2023-10-18 ENCOUNTER — Encounter

## 2023-10-18 ENCOUNTER — Encounter: Admitting: Occupational Therapy

## 2023-10-18 ENCOUNTER — Ambulatory Visit: Admitting: Physical Therapy

## 2023-10-19 ENCOUNTER — Encounter: Payer: Self-pay | Admitting: Physical Medicine & Rehabilitation

## 2023-10-19 ENCOUNTER — Encounter: Attending: Physical Medicine & Rehabilitation | Admitting: Physical Medicine & Rehabilitation

## 2023-10-19 VITALS — BP 143/83 | HR 69 | Ht 62.0 in | Wt 166.0 lb

## 2023-10-19 DIAGNOSIS — I69398 Other sequelae of cerebral infarction: Secondary | ICD-10-CM | POA: Insufficient documentation

## 2023-10-19 DIAGNOSIS — I69993 Ataxia following unspecified cerebrovascular disease: Secondary | ICD-10-CM | POA: Diagnosis present

## 2023-10-19 DIAGNOSIS — R209 Unspecified disturbances of skin sensation: Secondary | ICD-10-CM | POA: Diagnosis not present

## 2023-10-19 DIAGNOSIS — R269 Unspecified abnormalities of gait and mobility: Secondary | ICD-10-CM | POA: Diagnosis not present

## 2023-10-19 DIAGNOSIS — I6381 Other cerebral infarction due to occlusion or stenosis of small artery: Secondary | ICD-10-CM | POA: Diagnosis not present

## 2023-10-19 NOTE — Progress Notes (Signed)
 Subjective:  Erica [name] was admitted to rehab 09/28/2023 for inpatient therapies to consist of PT, ST and OT at least three hours five days a week. Past admission physiatrist, therapy team and rehab RN have worked together to provide customized collaborative inpatient rehab.  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  Loop recorder placed 2022.  She remains on low-dose aspirin  and Plavix  x 3 weeks then Plavix  alone with follow-up per neurology services.  Subcutaneous heparin  added for DVT prophylaxis no bleeding episodes.  History of CKD stage III creatinine baseline 1.27   68 y.o. right-handed female with history significant for hypertension type 2 diabetes mellitus CKD stage III COPD GERD rectal cancer undergoing laparoscopic low anterior resection 2014 followed by oncology services, depression, quit smoking 5 years ago as well as multiple prior strokes most recent 2024 including by thalamic, etiology most likely small vessel disease with a loop recorder placed and received CIR 03/08/2023 - 03/15/2023 discharge ambulating 200 feet supervision rolling walker as well as CVA 2022 receiving CIR 11/06/2020 - 11/21/2020 and currently maintained on Plavix .  Per chart review patient lives alone.  1 level apartment.  She has an aide 3 times a week 2 hours a day.  She has a son who checks on her routinely.  Used a Youth worker for mobility.  Presented 09/25/2023 with acute onset of right facial/tongue numbness and slurred speech.  CT/MRI showed acute lacunar infarct in the left thalamus.  Multiple additional remote infarcts.  CTA of the head showed no large vessel occlusion CTA of the neck showed no dissection.  Admission chemistries unremarkable except potassium 6.8 chloride 112 BUN 55 creatinine 2.00 hemoglobin A1c 6.9.  Echocardiogram with ejection fraction of 55 to 60%  grade 1 diastolic dysfunction.  Neurology follow-up placed on low-dose aspirin  and Plavix  for CVA prophylaxis x 3 weeks then Plavix  alone.  Subcutaneous heparin  added for DVT prophylaxis.  Her potassium was corrected with latest potassium 4.7 creatinine 1.67.  Therapy evaluations completed due to patient decreased functional mobility was admitted for a comprehensive rehab program.    Admit date: 09/28/2023 Discharge date: 10/10/2023  Patient ID: Erica [name], female    DOB: 04-Jul-1955, 68 y.o.   MRN: [id number]  HPI Here for post hospital follow-up visit after recurrent CVA.  Patient back at home. Staying by herself overnite but siblings help her during the day.  Sees PCP tomorrow  Has stopped ASA still taking plavix  75mg  per day as prescribed  No falls Mod I with dressing but still requiring Sup for shower  Has a shower chair and grab bars HH SLP and HHOT ~1x per wk Home is single level with 2 step entry   Pain Inventory Average Pain 7 Pain Right Now 7 My pain is dull  LOCATION OF PAIN  fingers, toes  BOWEL Number of stools per week: 7 Oral laxative use No  Type of laxative . Enema or suppository use No  History of colostomy No  Incontinent No   BLADDER Normal and Pads In and out cath, frequency . Able to self cath . Bladder incontinence No  Frequent urination No  Leakage with coughing No  Difficulty starting stream No  Incomplete bladder emptying No    Mobility use a cane how many minutes can you walk? 7 ability to climb steps?  no do you drive?  no  Function disabled: date disabled .  I need assistance with the following:  meal prep  Neuro/Psych numbness tingling trouble walking depression anxiety  Prior Studies TC appt  Physicians involved in your care TC appt   Family History  Problem Relation Age of Onset   Hypertension Father    Stroke Father    Diabetes Father    Heart disease Mother    Hypertension Mother    Hyperlipidemia Mother     Hypertension Sister    Hypertension Brother    Hypertension Sister    Hypertension Brother    Cancer Other 9       colon cancer    Cancer Maternal Aunt        cancer, unknown type    Cancer Maternal Uncle        bone cancer    Cancer Paternal Aunt        lung cancer   Cancer Paternal Uncle        lung cancer   Cancer Maternal Uncle        colon cancer and prostate cancer    Cancer Maternal Uncle        prostate cancer    Social History   Socioeconomic History   Marital status: Single    Spouse name: Not on file   Number of children: 1   Years of education: Not on file   Highest education level: Not on file  Occupational History   Not on file  Tobacco Use   Smoking status: Former    Current packs/day: 0.00    Average packs/day: 0.5 packs/day for 20.0 years (10.0 ttl pk-yrs)    Types: Cigarettes    Start date: 01/05/1998    Quit date: 01/05/2018    Years since quitting: 5.7   Smokeless tobacco: Never   Tobacco comments:    Unknown   Vaping Use   Vaping status: Never Used  Substance and Sexual Activity   Alcohol  use: Not Currently   Drug use: Not Currently    Types: "Crack" cocaine    Comment: Unknown    Sexual activity: Not Currently    Birth control/protection: None  Other Topics Concern   Not on file  Social History Narrative   12/30/20 lives with others   Social Drivers of Health   Financial Resource Strain: Not on file  Food Insecurity: No Food Insecurity (09/26/2023)   Hunger Vital Sign    Worried About Running Out of Food in the Last Year: Never true    Ran Out of Food in the Last Year: Never true  Transportation Needs: No Transportation Needs (09/26/2023)   PRAPARE - Administrator, Civil Service (Medical): No    Lack of Transportation (Non-Medical): No  Physical Activity: Not on file  Stress: Not on file  Social Connections: Moderately Isolated (09/26/2023)   Social Connection and Isolation Panel [NHANES]    Frequency of Communication  with Friends and Family: Once a week    Frequency of Social Gatherings with Friends and Family: Once a week    Attends Religious Services: 1 to 4 times per year    Active Member of [employer] or Organizations: Yes    Attends Banker Meetings: 1 to 4 times per year    Marital Status: Divorced   Past Surgical History:  Procedure Laterality Date   ANAL RECTAL MANOMETRY N/A 07/13/2016   Procedure: ANO RECTAL MANOMETRY;  Surgeon: Joyce Nixon, MD;  Location: [facility];  Service: Endoscopy;  Laterality: N/A;   CESAREAN  SECTION     EUS N/A 11/21/2012   Procedure: LOWER ENDOSCOPIC ULTRASOUND (EUS);  Surgeon: Evangeline Hilts, MD;  Location: [facility];  Service: Endoscopy;  Laterality: N/A;   FLEXIBLE SIGMOIDOSCOPY N/A 11/21/2012   Procedure: FLEXIBLE SIGMOIDOSCOPY;  Surgeon: Evangeline Hilts, MD;  Location: [facility];  Service: Endoscopy;  Laterality: N/A;   FLEXIBLE SIGMOIDOSCOPY N/A 01/28/2013   Procedure: FLEXIBLE SIGMOIDOSCOPY;  Surgeon: Joyce Nixon, MD;  Location: [facility];  Service: Endoscopy;  Laterality: N/A;   LAPAROSCOPIC LOW ANTERIOR RESECTION N/A 01/29/2013   Procedure: LAPAROSCOPIC LOW ANTERIOR RESECTION, Rigid Proctoscopy;  Surgeon: Joyce Nixon, MD;  Location: [facility];  Service: General;  Laterality: N/A;   LAPAROSCOPIC SIGMOID COLECTOMY N/A 11/14/2012   Procedure: DIAGNOSTIC LAPAROSCOPY AND SIGMOIDMOIDOSCOPY ;  Surgeon: Enid Harry, MD;  Location: [facility];  Service: General;  Laterality: N/A;   RECTAL ULTRASOUND N/A 07/13/2016   Procedure: RECTAL ULTRASOUND;  Surgeon: Joyce Nixon, MD;  Location: [facility];  Service: Endoscopy;  Laterality: N/A;   TONSILLECTOMY     TONSILLECTOMY AND ADENOIDECTOMY     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   GERD (gastroesophageal reflux disease)     with chronic cough   Headache(784.0)    "mild"   Hypertension    Loop Biotronik loop implant 12/16/2020 12/16/2020   Mental disorder    depression   Neuropathy    feet    Pain    arthritis pain - takes tramadol  as needed   Pulmonary fibrosis, unspecified (HCC)    Rectal cancer (HCC)    Rectal polyp    very little bleeding with bowel movements- no pain   Stroke (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   Suicide attempt (HCC)    BP (!) 143/83   Pulse 69   Ht 5\' 2"  (1.575 m)   Wt 166 lb (75.3 kg)   SpO2 95%   BMI 30.36 kg/m   Opioid Risk Score:   Fall Risk Score:  `1  Depression screen [facility] 2/9     10/19/2023   10:30 AM 04/08/2022   10:57 AM 11/26/2021    2:15 PM 04/20/2021    2:17 PM 12/11/2020    3:28 PM 12/03/2020    9:41 AM 07/29/2020    8:39 AM  Depression screen PHQ 2/9  Decreased Interest 1 1 1 1 2  0 3  Down, Depressed, Hopeless 1 1 1 1 1 1 3   PHQ - 2 Score 2 2 2 2 3 1 6   Altered sleeping 0     1 3  Tired, decreased energy 1     3 3   Change in appetite 0     2 3  Feeling bad or failure about yourself  0     0 3  Trouble concentrating 0     3 3  Moving slowly or fidgety/restless 3     1 3   Suicidal thoughts 0     0 1  PHQ-9 Score 6     11 25   Difficult doing work/chores Not difficult at all     Very difficult     Review of Systems  Respiratory:  Positive for cough and wheezing.   Musculoskeletal:  Positive for gait problem.  Skin:  Positive for rash.  Neurological:  Positive for numbness.  Psychiatric/Behavioral:  Positive  for dysphoric mood. The patient is nervous/anxious.   All other systems reviewed and are negative.     Objective:   Physical Exam  Mild dysmetria left finger-nose-finger, minimal dysmetria right finger-nose-finger Motor strength is 4/5 bilateral deltoid biceps flexor knee extensor ankle dorsiflexor Ambulates with a cane no evidence of toe drag or instability Sensation mildly reduced on left side to light touch Speech without dysarthria or aphasia Throat is  clear tongue is midline Extraocular muscles are intact Reduced sensation right hemifacial      Assessment & Plan:   1.  Recent left thalamic infarct with residual sensory deficits primarily affecting face mouth and throat on the right side overall symptoms are improving Her strength has come back nicely on the right side she still has some residual weakness on the left side from her prior right thalamic infarct. Functionally she is at a modified independent level for basic ADLs.  She needs assistance from family for more complex ADLs. Would finish out home health therapy PT OT.  Do not think outpatient therapy is needed because she is nearing her premorbid functional status. No physical medicine rehab follow-up needed Follow-up with neurology already scheduled for later this month Follow-up primary care provider

## 2023-10-23 ENCOUNTER — Telehealth: Payer: Self-pay | Admitting: Adult Health

## 2023-10-23 ENCOUNTER — Ambulatory Visit: Admitting: Physical Therapy

## 2023-10-23 ENCOUNTER — Ambulatory Visit: Attending: Nurse Practitioner | Admitting: Physical Therapy

## 2023-10-23 ENCOUNTER — Encounter: Admitting: Speech Pathology

## 2023-10-23 ENCOUNTER — Ambulatory Visit: Admitting: Occupational Therapy

## 2023-10-23 NOTE — Therapy (Deleted)
 OUTPATIENT OCCUPATIONAL THERAPY NEURO EVALUATION  Patient Name: Erica [name] MRN: [id number] DOB:[birth year], 68 y.o., female Today's Date: 10/23/2023  PCP: Carolyn Cisco, NP  REFERRING PROVIDER: Sterling Eisenmenger, PA-C   END OF SESSION:   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   GERD (gastroesophageal reflux disease)    with chronic cough   Headache(784.0)    "mild"   Hypertension    Loop Biotronik loop implant 12/16/2020 12/16/2020   Mental disorder    depression   Neuropathy    feet    Pain    arthritis pain - takes tramadol  as needed   Pulmonary fibrosis, unspecified (HCC)    Rectal cancer (HCC)    Rectal polyp    very little bleeding with bowel movements- no pain   Stroke (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   Suicide attempt [facility])    Past Surgical History:  Procedure Laterality Date   ANAL RECTAL MANOMETRY N/A 07/13/2016   Procedure: ANO RECTAL MANOMETRY;  Surgeon: Joyce Nixon, MD;  Location: [facility];  Service: Endoscopy;  Laterality: N/A;   CESAREAN SECTION     EUS N/A 11/21/2012   Procedure: LOWER ENDOSCOPIC ULTRASOUND (EUS);  Surgeon: Evangeline Hilts, MD;  Location: [facility];  Service: Endoscopy;  Laterality: N/A;   FLEXIBLE SIGMOIDOSCOPY N/A 11/21/2012   Procedure: FLEXIBLE SIGMOIDOSCOPY;  Surgeon: Evangeline Hilts, MD;  Location: [facility];  Service: Endoscopy;  Laterality: N/A;   FLEXIBLE SIGMOIDOSCOPY N/A 01/28/2013   Procedure: FLEXIBLE SIGMOIDOSCOPY;  Surgeon: Joyce Nixon, MD;  Location: [facility];  Service: Endoscopy;  Laterality: N/A;   LAPAROSCOPIC LOW ANTERIOR RESECTION N/A 01/29/2013   Procedure: LAPAROSCOPIC LOW ANTERIOR RESECTION, Rigid Proctoscopy;  Surgeon: Joyce Nixon, MD;  Location: [facility];  Service: General;  Laterality: N/A;   LAPAROSCOPIC SIGMOID COLECTOMY N/A 11/14/2012    Procedure: DIAGNOSTIC LAPAROSCOPY AND SIGMOIDMOIDOSCOPY ;  Surgeon: Enid Harry, MD;  Location: [facility];  Service: General;  Laterality: N/A;   RECTAL ULTRASOUND N/A 07/13/2016   Procedure: RECTAL ULTRASOUND;  Surgeon: Joyce Nixon, MD;  Location: [facility];  Service: Endoscopy;  Laterality: N/A;   TONSILLECTOMY     TONSILLECTOMY AND ADENOIDECTOMY     Patient Active Problem List   Diagnosis Date Noted   Alteration of sensation as late effect of stroke 10/19/2023   Gait disturbance, post-stroke 10/19/2023   Left thalamic infarction (HCC) 09/28/2023   Acute lacunar infarction in the left thalamus (HCC) 09/25/2023   History of CVA with residual deficit 09/25/2023   Type 2 diabetes mellitus with hypoglycemia, with long-term current use of insulin  (HCC) 09/25/2023   Idiopathic pulmonary fibrosis (HCC) 09/25/2023   Hyperlipidemia associated with type 2 diabetes mellitus (HCC) 09/25/2023   Syncope 03/18/2023   Stroke (cerebrum) (HCC) 03/08/2023   Acute ischemic stroke (HCC) 03/05/2023   Stroke-like symptom 03/04/2023   Research study patient 07/15/2022   Encounter for loop recorder check 01/02/2021   Loop Biotronik loop implant 12/16/2020 12/16/2020   Leukopenia    Controlled type 2 diabetes mellitus with hyperglycemia, with long-term current use of insulin  (HCC)    Vascular headache    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   Sinus bradycardia on ECG 11/03/2020   Benign essential HTN    Pancytopenia (HCC)    Chronic kidney disease,  stage 3b (HCC)    Anemia    Labile blood glucose    Labile blood pressure    Diabetic peripheral neuropathy (HCC)    Hypertension associated with diabetes (HCC)    AKI (acute kidney injury) (HCC)    Ataxia, late effect of cerebrovascular disease    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   Acute CVA (cerebrovascular accident) (HCC) 06/26/2020   CVA (cerebral vascular accident) (HCC) 06/25/2020   BMI  32.0-32.9,adult 06/03/2020   Left upper lobe pneumonia 03/30/2020   Chest pain 10/01/2019   MDD (major depressive disorder), recurrent severe, 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   Suicide attempt (HCC) 05/09/2019   Cocaine abuse (HCC) 05/09/2019   Interstitial lung disease (HCC) 06/21/2016   COPD (chronic obstructive pulmonary disease) (HCC) 06/21/2016   Cough 06/21/2016   GERD (gastroesophageal reflux disease) 06/21/2016   Hypersomnia 06/21/2016   Tobacco user 06/21/2016   Rectal cancer (HCC) 01/30/2013    ONSET DATE: 10/09/23 (referral date), 09/25/23 (most recent CVA, note hx of multiple CVA)  REFERRING DIAG: I63.81 (ICD-10-CM) - Other cerebral infarction due to occlusion or stenosis of small artery   THERAPY DIAG:  No diagnosis found.  Rationale for Evaluation and Treatment: Rehabilitation  SUBJECTIVE:   SUBJECTIVE STATEMENT: *** Pt accompanied by: {accompnied:27141}  PERTINENT HISTORY: New CVA 09/25/23, arthritis especially in shoulders, asthma, COPD, depression, DM, hx of multiple strokes, GERD, HTN, loop recorder implant 12/16/20, neuropathy, hx of rectal cancer, pulmonary fibrosis, suicide attempt  Per 10/10/23 PA-C D/C Summary: "Presented 09/25/2023 with acute onset of right facial/tongue numbness and slurred speech. CT/MRI showed acute lacunar infarct in the left thalamus. Multiple additional remote infarcts. CTA of the head showed no large vessel occlusion CTA of the neck showed no dissection."  Per 10/09/23 inpt OT note: "Patient to discharge at overall Modified Independent level for BADLs and supervision for IADLs. Supervision recommended for IADLs.  Patient's care partner is independent to provide the necessary physical and cognitive assistance at discharge."   Per 06/21/23 NP Progress Notes: history of recurrent ischemic stroke.  She had 2 strokes in 2022 (January and May), again in September 2024 and then October 2024.  She is currently off Plavix  and only on aspirin  81 mg  PO daily.  She had a LOOP recorder placed in July 2022... She quit smoking 3 years ago after her first stroke... She has remote history of rectal cancer stage I (T1, N0, cM0) followed by Dr. Maryalice Smaller... She has mild weakness on the left side including foot drop. Tingling noted in the left side as well post stroke.  Tingling in hands with cold    PRECAUTIONS: Fall and Other: loop implant, no driving   WEIGHT BEARING RESTRICTIONS: {Yes ***/No:24003}  PAIN:  Are you having pain? {OPRCPAIN:27236}  FALLS: Has patient fallen in last 6 months? {fallsyesno:27318}  LIVING ENVIRONMENT: *** Lives with: lives alone (24 hr care from family) but looking into an Assisted Living Facility Lives in: apartment on first  floor, washer/dryer in apt Has following equipment at home: Single point cane, Environmental consultant - 2 wheeled, Wheelchair (manual), shower chair, and bed side commode  PLOF: Independent with basic ADLs prior to December ***   PATIENT GOALS: ***  OBJECTIVE:  Note: Objective measures were completed at Evaluation unless otherwise noted.  HAND DOMINANCE: Right  ADLs: Overall ADLs: *** Transfers/ambulation related to ADLs: Eating: *** Grooming: *** UB Dressing: *** LB Dressing: *** Toileting: *** Bathing: *** Tub Shower transfers: *** Equipment: {equipment:25573}  IADLs: Shopping: *** Light housekeeping: ***  Meal Prep: *** Community mobility: *** Medication management: *** Financial management: *** Handwriting: {OTWRITTENEXPRESSION:25361}  MOBILITY STATUS: {OTMOBILITY:25360}  POSTURE COMMENTS:  {posture:25561} Sitting balance: {sitting balance:25483}  ACTIVITY TOLERANCE: Activity tolerance: ***  FUNCTIONAL OUTCOME MEASURES: {OTFUNCTIONALMEASURES:27238}  UPPER EXTREMITY ROM:    {AROM/PROM:27142} ROM Right eval Left eval  Shoulder flexion    Shoulder abduction    Shoulder adduction    Shoulder extension    Shoulder internal rotation    Shoulder external rotation    Elbow  flexion    Elbow extension    Wrist flexion    Wrist extension    Wrist ulnar deviation    Wrist radial deviation    Wrist pronation    Wrist supination    (Blank rows = not tested)  UPPER EXTREMITY MMT:     MMT Right eval Left eval  Shoulder flexion    Shoulder abduction    Shoulder adduction    Shoulder extension    Shoulder internal rotation    Shoulder external rotation    Middle trapezius    Lower trapezius    Elbow flexion    Elbow extension    Wrist flexion    Wrist extension    Wrist ulnar deviation    Wrist radial deviation    Wrist pronation    Wrist supination    (Blank rows = not tested)  HAND FUNCTION: {handfunction:27230}  COORDINATION: {otcoordination:27237}  SENSATION: {sensation:27233}  EDEMA: ***  MUSCLE TONE: {UETONE:25567}  COGNITION: Overall cognitive status: {cognition:24006}  VISION: Subjective report: *** Baseline vision: {OTBASELINEVISION:25363} Visual history: {OTVISUALHISTORY:25364}  VISION ASSESSMENT: {visionassessment:27231}  Patient has difficulty with following activities due to following visual impairments: ***  PERCEPTION: {Perception:25564}  PRAXIS: {Praxis:25565}  OBSERVATIONS: ***                                                                                                                             TREATMENT DATE: ***         PATIENT EDUCATION: Education details: *** Person educated: {Person educated:25204} Education method: {Education Method:25205} Education comprehension: {Education Comprehension:25206}  HOME EXERCISE PROGRAM: ***   GOALS: Goals reviewed with patient? {yes/no:20286}  SHORT TERM GOALS: Target date: ***  *** Baseline: Goal status: INITIAL  2.  *** Baseline:  Goal status: INITIAL  3.  *** Baseline:  Goal status: INITIAL  4.  *** Baseline:  Goal status: INITIAL  5.  *** Baseline:  Goal status: INITIAL  6.  *** Baseline:  Goal status: INITIAL  LONG  TERM GOALS: Target date: ***  *** Baseline:  Goal status: INITIAL  2.  *** Baseline:  Goal status: INITIAL  3.  *** Baseline:  Goal status: INITIAL  4.  *** Baseline:  Goal status: INITIAL  5.  *** Baseline:  Goal status: INITIAL  6.  *** Baseline:  Goal status: INITIAL  ASSESSMENT:  CLINICAL IMPRESSION: Patient is a *** y.o. *** who was seen today for occupational therapy evaluation for ***.  PERFORMANCE DEFICITS: in functional skills including {OT physical skills:25468}, cognitive skills including {OT cognitive skills:25469}, and psychosocial skills including {OT psychosocial skills:25470}.   IMPAIRMENTS: are limiting patient from {OT performance deficits:25471}.   CO-MORBIDITIES: {Comorbidities:25485} that affects occupational performance. Patient will benefit from skilled OT to address above impairments and improve overall function.  MODIFICATION OR ASSISTANCE TO COMPLETE EVALUATION: {OT modification:25474}  OT OCCUPATIONAL PROFILE AND HISTORY: {OT PROFILE AND HISTORY:25484}  CLINICAL DECISION MAKING: {OT CDM:25475}  REHAB POTENTIAL: {rehabpotential:25112}  EVALUATION COMPLEXITY: {Evaluation complexity:25115}    PLAN:  OT FREQUENCY: {rehab frequency:25116}  OT DURATION: {rehab duration:25117}  PLANNED INTERVENTIONS: {OT Interventions:25467}  RECOMMENDED OTHER SERVICES: ***  CONSULTED AND AGREED WITH PLAN OF CARE: {OZH:08657}  PLAN FOR NEXT SESSION: ***   Oakley Bellman, OT 10/23/2023, 12:59 PM

## 2023-10-23 NOTE — Telephone Encounter (Signed)
 Pt called to reschedule appt . Informed Pt  Appt is May 22.  At  8:15 am .Pt Kept appt.

## 2023-10-25 ENCOUNTER — Encounter: Admitting: Speech Pathology

## 2023-10-25 ENCOUNTER — Ambulatory Visit: Admitting: Physical Therapy

## 2023-10-25 NOTE — Progress Notes (Signed)
 [facility] Summary Report / Loop Recorder

## 2023-10-26 ENCOUNTER — Other Ambulatory Visit: Payer: Self-pay

## 2023-10-26 ENCOUNTER — Telehealth: Payer: Self-pay

## 2023-10-26 NOTE — Telephone Encounter (Signed)
 Pt called stating she just recently got d/c from the hospital d/t a stroke and was told to f/u with PCP which pt did.  Pt stated her d/c paperwork also stated for the pt to f/u with Dr. Candise Chambers office; therefore, she's contacting Dr. Candise Chambers office to schedule a f/u appt.  Stated this nurse will notify Dr. Maryalice Smaller and Team of the pt's call and Dr. Candise Chambers scheduler will give you a call to get you scheduled.

## 2023-10-30 ENCOUNTER — Ambulatory Visit: Admitting: Physical Therapy

## 2023-10-30 ENCOUNTER — Encounter

## 2023-11-01 ENCOUNTER — Telehealth: Payer: Self-pay | Admitting: Adult Health

## 2023-11-01 ENCOUNTER — Encounter

## 2023-11-01 NOTE — Telephone Encounter (Signed)
 [facility] conf

## 2023-11-01 NOTE — Progress Notes (Deleted)
 [facility] [address] [place]. [place] [address] [phone]       HOSPITAL FOLLOW UP NOTE  [name] Date of Birth:  1955-12-28 Medical Record Number:  [id number]   Reason for visit: hospital stroke follow up    SUBJECTIVE:   CHIEF COMPLAINT:  No chief complaint on file.    HPI:   Update 11/02/2023 JM: Patient returns for hospital follow-up visit.  She presented to [facility] ED on 09/25/2023 for right tongue and right groin numbness, difficulty ambulating and altered mental status.  She was found to have left thalamic infarct likely secondary to small vessel disease.  Loop recorder interrogated without evidence of A-fib.  CTA head/neck unremarkable.  EF 55 to 60%.  LDL 78, A1c 5.6.  Recommended DAPT for 3 weeks then Plavix  alone and added Zetia  in addition to Crestor  40 mg daily.  She spent 12 days in inpatient rehab before returning back home.          History provided for reference purposes only Update 04/20/2023 JM: Patient returns for hospital follow-up.  She presented to [facility] ED with altered mental status on 03/04/2023. CTH negative for acute stroke.  MRI brain showed small acute to subacute bilateral centrum semiovale infarcts.  MRA brain normal.  Carotid duplex less than 50% stenosis bilateral ICAs, minimal calcified plaque at left ICA origin.  EF 65 to 70%.  Evaluated by neurology team, suspected infarcts bilateral small vessel disease.  Recommended DAPT for 3 weeks then aspirin  alone and increase Crestor  from 20 mg to 40 mg daily. LDL 86. A1c 6.9.  She was discharged to CIR.  She returned to ED 10/5 after syncopal episode.  Noted orthostatic hypotension likely contributing to syncopal event.  CTH negative. CTA head/neck minimal arthrosclerotic calcifications of bilateral ICAs without significant stenosis otherwise unremarkable.  MRI brain showed acute left thalamic infarct and multiple chronic infarcts including bilateral occipital lobe infarcts,  watershed territory infarcts and diffuse subcortical white matter disease, and bilateral basal ganglia and cerebellar infarcts.  She was seen by the neurology team who felt etiology likely small vessel disease.  Recommended DAPT for 21 days and continue Crestor .  Loop recorder interrogated without evidence of A-fib.  She was discharged with home health therapies.  Currently, she reports being stable since discharge.  She reports residual difficulty with speech, worsening left-sided weakness weakness (compared to baseline from prior stroke) and some imbalance. Has not been able to start therapy due to difficulty scheduling.  Ambulates with cane, no recent falls. Lives in own home but does have son and sister to assist as needed.  She does not drive, was brought to todays visit by family, waiting in waiting room. Denies new stroke/TIA symptoms.   She does report excessive daytime fatigue even prior to recent strokes.  Has difficulty falling asleep at night, will use trazodone  if needed but prefers to avoid if able as this causes night terrors.  Has been told that she snores.  Unsure if witnessed apneas as she sleeps alone.  She did undergo prior sleep study in 2018 but she reports she did not sleep well so they were not able to get accurate results, did review results which did show concern of OSA.   Completed 3 weeks DAPT, remains on aspirin  as advised and Crestor  without side effects.  Routinely follows with PCP for stroke risk factor management - reports starting Actos yesterday by PCP and stopping insulin  but she is concerned glucose levels elevated this morning,  she was encouraged to reach out to their office to further discuss.  Blood pressure today slightly elevated, routinely monitors at home and can fluctuate typically ranging 100-140s/70-80s, reports BP managed by PCP. Was seen by cardiology on 11/6, was referred to hematology for hypercoagulable workup given recurrent strokes, has initial visit  12/18.  Loop recorder has not shown evidence of A-fib thus far.  Update 12/02/2021 JM: Patient returns for stroke follow-up after prior visit 7 months ago.  She did present to ED on 6/12 with complaints of worsening left-sided facial numbness and gradual left leg weakness throughout the day as well as walking difficulty on 6/10, due to persisting she proceeded to ED.  Completed MRI brain which was negative for acute stroke.  Symptoms gradually improved prior to discharge. Lab work markable except elevated creatinine level advise follow-up with cardiology to discuss Entresto .  She has been doing since then. Does confirm she returned back to baseline functioning prior to leaving.  Feels as though left-sided weakness has improved since prior visit.  Ambulating with cane, denies any recent falls.  Does still have mild left hand numbness and left lip tingling.  She is concerned regarding her short-term memory which has been poor since her stroke. She will forget recent conversations or walk in to a room and forget why she went in to the room. No issues with long term.  Does do word searches at home. Does admit to some underlying anxiety and difficulty concentrating.  Is currently on sertraline  and venlafaxine  managed by PCP.  Sleep is poor with longstanding insomnia, has sleep study in 2018 which was negative for sleep apnea (per pt report, unable to view results via epic). Reports her mother had Alzheimer's disease although never formally diagnosed and did have a large stroke in her 32's which was around the time she started to have memory loss. She is fearful that she may be developing Alzheimer's dementia and would like to see a psychologist for such.   Reports compliance on aspirin  and Crestor , denies side effects.  Blood pressure today 145/85.  Reports recent A1c 5.2.  Routinely followed by PCP.   No further concerns at this time  Update 04/29/2021 JM: Returns for 62-month stroke follow-up  unaccompanied  Continues to work with therapies making gradual improving of left sided weakness. Continues to use RW and at times her cane. Denies any recent falls.  Denies new stroke/TIA symptoms. Her son continues to live with her but able to maintain ADLs and majority of IADLs independently.   Compliant on aspirin  and crestor  - denies side effects Blood pressure today slightly increased - reports increased stressors today but monitors at home and typically stable Glucose levels stable at home -has not had recent A1c check Recent direct LDL 37  Plans to contact PCP to schedule follow-up visit Routinely followed by pulmonology for pulmonary fibrosis  No new concerns at this time  Update 12/30/2020 JM: Erica [name] returns for hospital follow-up regarding recent stroke on 11/03/2020.  She presented with 2-3 day hx of headaches and worsening left-sided weakness.  Personally reviewed hospitalization pertinent progress notes, lab work and imaging summary provided.  Evaluated by Dr. Janett Medin with stroke work-up revealing right parietal occipital stroke likely due to small vessel disease although unable to rule out cardioembolic source given recent right carotid head and right anterior lateral thalamic infarcts.  MRA negative LVO.  Carotid Dopplers unremarkable.  Normal EF.  Loop recorder recommended but declined by patient inpatient.  Prior 14-day cardiac  monitor completed which was negative for A. fib.  On Plavix  PTA and recommended DAPT for 3 weeks and aspirin  alone.  HTN stable.  LDL 56 on Crestor  20 mg daily.  Controlled DM with A1c 5.2.  Other stroke risk factors include recent stroke (see below), advanced age, former tobacco use and CAD.  Discharged to CIR on 11/06/2020 -11/21/2020.  Since discharge, she has been making gradual improvements.  Currently working with home health PT for residual left-sided weakness and left sided numbness. She has since completed SLP. She has also been having headaches since  her stroke - has been taking tramadol  with benefit which was started by PMR.  Continues to ambulate with RW at all times -denies any recent falls. Her son is currently living with her. Able to maintain ADLs independently. Completed 3 weeks DAPT and remains on aspirin  alone as well as Crestor  without associated side effects.  Blood pressure today 128/79.  Had loop recorder placed by Dr. Albert Huff 7/6.   Sister jointed visit towards the end of visit - she is concerned regarding worsening depression and living situation as son does not assist with patients care.  She has not had recent visit with PCP.  She is currently on trazodone , venlafaxine , and buspirone   Initial visit 08/19/2020 JM: Erica [name] is being seen for hospital follow-up accompanied by her son.  She was discharged home from CIR on 07/15/2020.  Reports residual left sided weakness with numbness/tingling and left lower facial numbness Working with [facility] PT/OT 2x/ week -reports continued improvement since discharge Currently ambulating with rolling walker but she was not using previously -denies any recent falls Lives with her son who will assist as needed but she has been slowly becoming more independent with ADLs Denies new stroke/TIA symptoms  Completed 3 weeks DAPT and remains on Plavix  alone -denies side effects Reports compliance on atorvastatin  40 mg daily -denies side effects Blood pressure today satisfactory 127/78 -monitors at home and typically stable Glucose levels routinely monitored at home which have been fluctuating per patient report  She has not had follow-up with PCP or cardiology since discharge  No further concerns at this time  Stroke admission 06/25/2020: Erica [name] is a 68 y.o. female with history of hypertension, hypercholesteremia, diabetes complicated by neuropathy, CAD, depression C/V prior suicide attempt, arthritis, 10-pack-year history of smoking quit in 2020, COPD/asthma, polysubstance abuse who presented to  [facility] ED on 06/25/2020 with slurred speech on and off for past week and left leg weakness.    Personally reviewed hospitalization pertinent progress notes, lab work and imaging with summary provided.  Evaluated by Dr. Christiane Cowing with stroke work-up revealing right caudate head and right anterior lateral thalamic infarcts, likely secondary to small vessel disease however given 2 separate locations unable to rule out embolic source with 30-day cardiac event monitor outpatient.  Recommended DAPT for 3 weeks and Plavix  alone.  LDL 86 -increase atorvastatin  from 20 mg to 40 mg daily and ongoing use of fenofibrate  and Vascepa .  Controlled DM with A1c 5.2.  Other stroke risk factors include advanced age, obesity, CAD, hx of cocaine use and former tobacco use.  Residual deficits of left hemiparesis and left hemisensory impairment.  Evaluated by therapies and discharged to CIR for ongoing therapy needs  Stroke: Lacunar infarcts in right caudate head and right anterior lateral thalamus, likely due to small vessel disease. However, given two separate location will need to rule out emoblic source CT head: Lacunar infarcts in the right caudate and  right cerebellum, new from 2012, but age indeterminate. MRI : 1 cm acute infarction in the anterolateral thalamus on the right, possibly with some involvement of the posterior limb internal capsule. Acute infarction within the right caudate head.  MRA : Both internal carotid arteries widely patent through the skull base and siphon regions. No stenosis.  Carotid Doppler: Velocities in the left and right ICA are consistent with a 1-39%  stenosis.  2D Echo: Left ventricular ejection fraction, by estimation, is 55 to 60%.  30 day cardiac event monitoring as outpt to rule out afib LDL 86 HgbA1c 5.2 RPR/HIV neg VTE prophylaxis -Lovenox  30 mg aspirin  81 mg daily prior to admission, now on aspirin  81 mg daily and Plavix  DAPT for 3 weeks, then Plavix  75 mg alone.  Therapy recommendations:  CIR Disposition: d/c to CIR on 06/30/2020       ROS:   14 system review of systems performed and negative with exception of those listed in HPI  PMH:  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   GERD (gastroesophageal reflux disease)    with chronic cough   Headache(784.0)    "mild"   Hypertension    Loop Biotronik loop implant 12/16/2020 12/16/2020   Mental disorder    depression   Neuropathy    feet    Pain    arthritis pain - takes tramadol  as needed   Pulmonary fibrosis, unspecified (HCC)    Rectal cancer (HCC)    Rectal polyp    very little bleeding with bowel movements- no pain   Stroke (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   Suicide attempt (HCC)     PSH:  Past Surgical History:  Procedure Laterality Date   ANAL RECTAL MANOMETRY N/A 07/13/2016   Procedure: ANO RECTAL MANOMETRY;  Surgeon: Joyce Nixon, MD;  Location: [facility];  Service: Endoscopy;  Laterality: N/A;   CESAREAN SECTION     EUS N/A 11/21/2012   Procedure: LOWER ENDOSCOPIC ULTRASOUND (EUS);  Surgeon: Evangeline Hilts, MD;  Location: [facility];  Service: Endoscopy;  Laterality: N/A;   FLEXIBLE SIGMOIDOSCOPY N/A 11/21/2012   Procedure: FLEXIBLE SIGMOIDOSCOPY;  Surgeon: Evangeline Hilts, MD;  Location: [facility];  Service: Endoscopy;  Laterality: N/A;   FLEXIBLE SIGMOIDOSCOPY N/A 01/28/2013   Procedure: FLEXIBLE SIGMOIDOSCOPY;  Surgeon: Joyce Nixon, MD;  Location: [facility];  Service: Endoscopy;  Laterality: N/A;   LAPAROSCOPIC LOW ANTERIOR RESECTION N/A 01/29/2013   Procedure: LAPAROSCOPIC LOW ANTERIOR RESECTION, Rigid Proctoscopy;  Surgeon: Joyce Nixon, MD;  Location: [facility];  Service: General;  Laterality: N/A;   LAPAROSCOPIC SIGMOID COLECTOMY N/A 11/14/2012   Procedure: DIAGNOSTIC LAPAROSCOPY AND SIGMOIDMOIDOSCOPY ;  Surgeon: Enid Harry, MD;  Location: [facility];  Service: General;  Laterality: N/A;   RECTAL ULTRASOUND N/A 07/13/2016   Procedure: RECTAL ULTRASOUND;  Surgeon: Joyce Nixon, MD;  Location: [facility];  Service: Endoscopy;  Laterality: N/A;   TONSILLECTOMY     TONSILLECTOMY AND ADENOIDECTOMY      Social History:  Social History   Socioeconomic History   Marital status: Single    Spouse name: Not on file   Number of children: 1   Years of education: Not on file   Highest education level: Not on file  Occupational History   Not on file  Tobacco Use   Smoking  status: Former    Current packs/day: 0.00    Average packs/day: 0.5 packs/day for 20.0 years (10.0 ttl pk-yrs)    Types: Cigarettes    Start date: 01/05/1998    Quit date: 01/05/2018    Years since quitting: 5.8   Smokeless tobacco: Never   Tobacco comments:    Unknown   Vaping Use   Vaping status: Never Used  Substance and Sexual Activity   Alcohol  use: Not Currently   Drug use: Not Currently    Types: "Crack" cocaine    Comment: Unknown    Sexual activity: Not Currently    Birth control/protection: None  Other Topics Concern   Not on file  Social History Narrative   12/30/20 lives with others   Social Drivers of Health   Financial Resource Strain: Not on file  Food Insecurity: No Food Insecurity (09/26/2023)   Hunger Vital Sign    Worried About Running Out of Food in the Last Year: Never true    Ran Out of Food in the Last Year: Never true  Transportation Needs: No Transportation Needs (09/26/2023)   PRAPARE - Administrator, Civil Service (Medical): No    Lack of Transportation (Non-Medical): No  Physical Activity: Not on file  Stress: Not on file  Social Connections: Moderately Isolated (09/26/2023)   Social Connection and Isolation Panel [NHANES]    Frequency of Communication with Friends and Family: Once a week    Frequency of Social Gatherings with Friends and Family: Once a week    Attends Religious  Services: 1 to 4 times per year    Active Member of [employer] or Organizations: Yes    Attends Banker Meetings: 1 to 4 times per year    Marital Status: Divorced  Catering manager Violence: Not At Risk (09/26/2023)   Humiliation, Afraid, Rape, and Kick questionnaire    Fear of Current or Ex-Partner: No    Emotionally Abused: No    Physically Abused: No    Sexually Abused: No    Family History:  Family History  Problem Relation Age of Onset   Hypertension Father    Stroke Father    Diabetes Father    Heart disease Mother    Hypertension Mother    Hyperlipidemia Mother    Hypertension Sister    Hypertension Brother    Hypertension Sister    Hypertension Brother    Cancer Other 56       colon cancer    Cancer Maternal Aunt        cancer, unknown type    Cancer Maternal Uncle        bone cancer    Cancer Paternal Aunt        lung cancer   Cancer Paternal Uncle        lung cancer   Cancer Maternal Uncle        colon cancer and prostate cancer    Cancer Maternal Uncle        prostate cancer     Medications:   Current Outpatient Medications on File Prior to Visit  Medication Sig Dispense Refill   acetaminophen  (TYLENOL ) 325 MG tablet Take 2 tablets (650 mg total) by mouth every 4 (four) hours as needed for mild pain (pain score 1-3) (or temp > 37.5 C (99.5 F)).     albuterol  (VENTOLIN  HFA) 108 (90 Base) MCG/ACT inhaler Inhale 2 puffs into the lungs every 4 (four) hours as needed for wheezing. 6.7 g  5   amLODipine  (NORVASC ) 10 MG tablet Take 1 tablet (10 mg total) by mouth daily. 30 tablet 0   cetirizine  (ZYRTEC ) 10 MG tablet Take 1 tablet (10 mg total) by mouth daily. 30 tablet 0   clopidogrel  (PLAVIX ) 75 MG tablet Take 1 tablet (75 mg total) by mouth daily. 30 tablet 0   dapagliflozin  propanediol (FARXIGA ) 10 MG TABS tablet Take 1 tablet (10 mg total) by mouth daily. 30 tablet 0   diclofenac  Sodium (VOLTAREN ) 1 % GEL Apply 1 Application topically 2 (two) times  daily as needed (pain). 100 g 0   ezetimibe  (ZETIA ) 10 MG tablet Take 1 tablet (10 mg total) by mouth daily. 30 tablet 0   gabapentin  (NEURONTIN ) 100 MG capsule Take 1 capsule (100 mg total) by mouth 3 (three) times daily. 90 capsule 0   hydrALAZINE  (APRESOLINE ) 25 MG tablet Take 1 tablet (25 mg total) by mouth 3 (three) times daily. 90 tablet 0   insulin  glargine (LANTUS ) 100 UNIT/ML Solostar Pen Inject 10 Units into the skin daily. Pen expires 28 days after first use 15 mL 11   Insulin  Pen Needle 32G X 4 MM MISC Use with Lantus  pen 100 each 0   melatonin 3 MG TABS tablet Take 1 tablet (3 mg total) by mouth at bedtime. 30 tablet 0   naphazoline-glycerin  (CLEAR EYES REDNESS) 0.012-0.25 % SOLN Place 1-2 drops into both eyes 3 (three) times daily with meals. (Patient taking differently: Place 2 drops into both eyes daily as needed for eye irritation.)     pantoprazole  (PROTONIX ) 40 MG tablet Take 1 tablet (40 mg total) by mouth daily. 30 tablet 0   Pirfenidone  801 MG TABS TAKE 1 TABLET (801 MG) BY MOUTH WITH BREAKFAST, WITH LUNCH, AND WITH EVENING MEAL. (Patient taking differently: Take 1 tablet by mouth in the morning and at bedtime.) 270 tablet 0   rosuvastatin  (CRESTOR ) 40 MG tablet Take 1 tablet (40 mg total) by mouth daily. 30 tablet 0   sertraline  (ZOLOFT ) 50 MG tablet Take 1 tablet (50 mg total) by mouth daily. 30 tablet 0   SYMBICORT  160-4.5 MCG/ACT inhaler Inhale 2 puffs into the lungs 2 (two) times daily. 10.2 g 5   venlafaxine  XR (EFFEXOR -XR) 150 MG 24 hr capsule Take 1 capsule (150 mg total) by mouth daily. 30 capsule 0   No current facility-administered medications on file prior to visit.    Allergies:   Allergies  Allergen Reactions   Penicillins Hives, Itching and Swelling    Tongue swelling      OBJECTIVE:  Physical Exam  There were no vitals filed for this visit.  There is no height or weight on file to calculate BMI. No results found.  General: well developed, well  nourished,  very pleasant elderly African-American female, seated, in no evident distress  Neurologic Exam Mental Status: Awake and fully alert.  Mild dysarthria.  No evidence of aphasia.  Oriented to place and time. Recent memory impaired and remote memory intact. Attention span, concentration and fund of knowledge appropriate during visit. Mood and affect appropriate.  Cranial Nerves: Pupils equal, briskly reactive to light. Extraocular movements full without nystagmus. Visual fields full to confrontation. Hearing intact.  Decreased light touch left lower facial sensation.  Slight left lower facial weakness.  Tongue and palate moves normally and symmetrically.  Motor: Normal bulk and tone and normal strength on the right side.  4/5 left hemiparesis although questionable effort and some giveaway weakness Sensory.:  Intact  to light touch, vibratory and pinprick sensation throughout Coordination: Rapid alternating movements impaired L>R.  Gait and Station: Arises from chair without difficulty. Stance is normal. Gait demonstrates slightly decreased step height of LLE with use of foot drop brace and cane, mild unsteadiness initially.  Unable to complete tandem walk or heel toe.   Reflexes: 1+ and symmetric. Toes downgoing.     PERTINENT IMAGING:  MRI brain 03/04/2023 IMPRESSION: 1. Small acute to subacute infarcts in the bilateral centrum semiovale without hemorrhage or mass effect. 2. Multiple remote infarcts in the supratentorial and infratentorial brain as above.  MRI brain 03/18/2023 IMPRESSION: 1. Acute punctate nonhemorrhagic infarct of the left thalamus. 2. Previously noted subcortical infarcts of the centrum semi ovale demonstrate expected change in are only faintly seen on the fusion weighted images without definite restricted diffusion. 3. 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 4. Remote watershed territory infarcts and diffuse subcortical  white matter disease is also stable. 5. Remote lacunar infarcts of the basal ganglia and cerebellum bilaterally are stable.  CTA head/neck 03/18/2023 IMPRESSION: 1. Minimal atherosclerotic calcifications within the cavernous internal carotid arteries bilaterally without significant stenosis. 2. Otherwise normal CTA Circle of Willis without significant proximal stenosis, aneurysm, or branch vessel occlusion. 3. Normal CT angio of the chest.  No extracranial stenosis. 4. Degenerative changes of the lower cervical spine. 5.  Emphysema (ICD10-J43.9).        ASSESSMENT: JAMESE TRAUGER is a 68 y.o. year old female with recent left thalamic stroke in 09/2023 likely secondary to small vessel disease. Hx of bilateral centrum semiovale infarcts 02/12/2023 and left thalamic infarct on 03/18/2023 likely secondary to small vessel disease.  History of recurrent right MCA strokes on 11/03/2020 and 06/25/2020 likely in setting of small vessel disease although unable to rule out cardioembolic source with residual left hemiparesis and sensory impairment.  Loop recorder placed 12/2020 by Dr. Albert Huff [facility] cardiology).  Vascular risk factors include HTN, HLD, DM, CAD, history of tobacco use and history of polysubstance abuse and multiple prior strokes noted on imaging.       PLAN:  History of multiple strokes:  Residual deficit: Mild left hemiparesis, gait impairment, dysarthria.  Referral placed to neurorehab PT/OT/SLP.  Advised continued use of cane at all times for fall prevention. Recommend restarting Plavix  75 mg daily and discontinue aspirin  81 mg daily as she had reoccurring strokes on aspirin  therapy and continue Crestor  40 mg daily (dosage increased after recent admission) for secondary stroke prevention. Will place order today for plavix  but request ongoing refills by PCP as this will be recommended life long as well as ongoing management of Crestor . S/p ILR 12/16/2020 - monitored by [facility] cardiology  Dr. Albert Huff.  Interrogated 03/2023 without evidence of A-fib F/u with hematology on 12/18 to evaluate for hypercoagulable disorder as recommended by cardiology discussed secondary stroke prevention measures and importance of close PCP follow up for aggressive stroke risk factor management including BP goal<130/90, HLD with LDL goal<70 and DM with A1c.<7  Stroke labs 02/2023: A1c 6.9, LDL 86  At risk for sleep apnea: Referral placed to [facility] sleep clinic for reevaluation of possible underlying sleep disorder in setting of recurrent strokes.  Also reports daytime sleepiness, insomnia and snoring.    Follow-up in 4 to 6 months or call earlier if needed    CC:  PCP: Carolyn Cisco, NP    I spent 58 minutes of face-to-face and non-face-to-face time with patient.  This included previsit chart  review including review of recent hospitalizations, lab review, study review, order entry, electronic health record documentation, patient education and discussion regarding above diagnoses and treatment plan and answered all other questions to patient's satisfaction  Johny Nap, [facility]  [facility] [address] [place], [place] [address]  Phone [phone] Fax [phone] Note: This document was prepared with digital dictation and possible smart phrase technology. Any transcriptional errors that result from this process are unintentional.

## 2023-11-02 ENCOUNTER — Encounter: Payer: Self-pay | Admitting: Adult Health

## 2023-11-02 ENCOUNTER — Ambulatory Visit: Payer: Medicare HMO | Admitting: Adult Health

## 2023-11-03 ENCOUNTER — Other Ambulatory Visit (HOSPITAL_COMMUNITY): Payer: Self-pay

## 2023-11-03 ENCOUNTER — Telehealth: Payer: Self-pay | Admitting: Adult Health

## 2023-11-03 NOTE — Telephone Encounter (Signed)
PT CALLED TO SCHEDULE APPT

## 2023-11-13 ENCOUNTER — Ambulatory Visit: Payer: Self-pay | Admitting: Nurse Practitioner

## 2023-11-13 NOTE — Telephone Encounter (Signed)
 Chief Complaint: SOB for a few weeks  Symptoms: "A little bit" of wheezing and productive cough Frequency: Intermittent Pertinent Negatives: Patient denies fever  Disposition: [x] Appointment (In office)  Additional Notes: Patient states she is not sure if it is the weather but thinks she needs refills on her inhalers. Patient scheduled for an appointment tomorrow in office. This RN educated pt on new-worsening symptoms and when to call back/seek emergent care. Pt verbalized understanding and agrees to plan.    Copied from CRM [id number]. Topic: Clinical - Red Word Triage >> Nov 13, 2023  3:17 PM Juliana Ocean wrote: Red Word that prompted transfer to Nurse Triage: sob/breathing problems,  ""I need t see the dr" Reason for Disposition  [1] MODERATE longstanding difficulty breathing (e.g., speaks in phrases, SOB even at rest, pulse 100-120) AND [2] SAME as normal  Answer Assessment - Initial Assessment Questions Chief Complaint: SOB for a few weeks  Symptoms: "A little bit" of wheezing and productive cough  Frequency: Intermittent  Pertinent Negatives: Patient denies fever  Protocols used: Breathing Difficulty-A-AH

## 2023-11-13 NOTE — Telephone Encounter (Signed)
 FYI Tammy, pt is scheduled ov for 6/3.

## 2023-11-14 ENCOUNTER — Ambulatory Visit: Admitting: Adult Health

## 2023-11-22 ENCOUNTER — Telehealth: Payer: Self-pay | Admitting: Adult Health

## 2023-11-22 NOTE — Telephone Encounter (Signed)
 Appointment details confirmed

## 2023-12-01 ENCOUNTER — Encounter: Attending: Physical Medicine & Rehabilitation | Admitting: Physical Medicine & Rehabilitation

## 2023-12-01 ENCOUNTER — Encounter: Payer: Self-pay | Admitting: Physical Medicine & Rehabilitation

## 2023-12-01 VITALS — BP 119/79 | HR 71 | Ht 62.0 in | Wt 163.4 lb

## 2023-12-01 DIAGNOSIS — I69391 Dysphagia following cerebral infarction: Secondary | ICD-10-CM | POA: Diagnosis present

## 2023-12-01 NOTE — Progress Notes (Signed)
 Subjective:    Patient ID: Erica [name], female    DOB: Jul 07, 1955, 68 y.o.   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  The patient had another stroke in May 2022 at the right parietal occipital junction.  The patient's suffered another stroke in September 2024 with small acute to subacute infarcts in bilateral central and semiovale.  Small vessel disease was suspected as underlying etiology.  Left thalamic stroke on 09/25/2023 with right facial numbness and slurred speech.   HPI  2 episodes of choking on saliva, pt was wheezing .  THis episode lasted about a minute and felt SOB.   Pt felt weak after this spell but did not note arm or leg shaking during the episode  Last episode 4d ago. The patient completed inpatient rehabilitation on 10/10/2023 Pt has had HA for the last 2 weeks , no falls  No blurring of vision no increased weakness pain only partially responsive to Tylenol . Pain Inventory Average Pain 8 Pain Right Now 8 My pain is sharp  In the last 24 hours, has pain interfered with the following? General activity 8 Relation with others 0 Enjoyment of life 0 What TIME of day is your pain at its worst? daytime Sleep (in general) Poor  Pain is worse with: walking Pain improves with: na Relief from Meds: na  Family History  Problem Relation Age of Onset   Hypertension Father    Stroke Father    Diabetes Father    Heart disease Mother    Hypertension Mother    Hyperlipidemia Mother    Hypertension Sister    Hypertension Brother    Hypertension Sister    Hypertension Brother    Cancer Other 63       colon cancer    Cancer Maternal Aunt        cancer, unknown type    Cancer Maternal Uncle        bone cancer    Cancer Paternal Aunt        lung cancer   Cancer Paternal Uncle        lung cancer   Cancer Maternal Uncle        colon cancer and  prostate cancer    Cancer Maternal Uncle        prostate cancer    Social History   Socioeconomic History   Marital status: Single    Spouse name: Not on file   Number of children: 1   Years of education: Not on file   Highest education level: Not on file  Occupational History   Not on file  Tobacco Use   Smoking status: Former    Current packs/day: 0.00    Average packs/day: 0.5 packs/day for 20.0 years (10.0 ttl pk-yrs)    Types: Cigarettes    Start date: 01/05/1998    Quit date: 01/05/2018    Years since quitting: 5.9   Smokeless tobacco: Never   Tobacco comments:    Unknown   Vaping Use   Vaping status: Never Used  Substance and Sexual Activity   Alcohol  use: Not Currently   Drug use: Not Currently    Types: Crack cocaine    Comment: Unknown    Sexual activity: Not Currently    Birth control/protection: None  Other Topics Concern   Not on file  Social History Narrative   12/30/20 lives  with others   Social Drivers of Corporate investment banker Strain: Not on file  Food Insecurity: No Food Insecurity (09/26/2023)   Hunger Vital Sign    Worried About Running Out of Food in the Last Year: Never true    Ran Out of Food in the Last Year: Never true  Transportation Needs: No Transportation Needs (09/26/2023)   PRAPARE - Administrator, Civil Service (Medical): No    Lack of Transportation (Non-Medical): No  Physical Activity: Not on file  Stress: Not on file  Social Connections: Moderately Isolated (09/26/2023)   Social Connection and Isolation Panel    Frequency of Communication with Friends and Family: Once a week    Frequency of Social Gatherings with Friends and Family: Once a week    Attends Religious Services: 1 to 4 times per year    Active Member of [employer] or Organizations: Yes    Attends Banker Meetings: 1 to 4 times per year    Marital Status: Divorced   Past Surgical History:  Procedure Laterality Date   ANAL RECTAL  MANOMETRY N/A 07/13/2016   Procedure: ANO RECTAL MANOMETRY;  Surgeon: Joyce Nixon, MD;  Location: [facility];  Service: Endoscopy;  Laterality: N/A;   CESAREAN SECTION     EUS N/A 11/21/2012   Procedure: LOWER ENDOSCOPIC ULTRASOUND (EUS);  Surgeon: Evangeline Hilts, MD;  Location: [facility];  Service: Endoscopy;  Laterality: N/A;   FLEXIBLE SIGMOIDOSCOPY N/A 11/21/2012   Procedure: FLEXIBLE SIGMOIDOSCOPY;  Surgeon: Evangeline Hilts, MD;  Location: [facility];  Service: Endoscopy;  Laterality: N/A;   FLEXIBLE SIGMOIDOSCOPY N/A 01/28/2013   Procedure: FLEXIBLE SIGMOIDOSCOPY;  Surgeon: Joyce Nixon, MD;  Location: [facility];  Service: Endoscopy;  Laterality: N/A;   LAPAROSCOPIC LOW ANTERIOR RESECTION N/A 01/29/2013   Procedure: LAPAROSCOPIC LOW ANTERIOR RESECTION, Rigid Proctoscopy;  Surgeon: Joyce Nixon, MD;  Location: [facility];  Service: General;  Laterality: N/A;   LAPAROSCOPIC SIGMOID COLECTOMY N/A 11/14/2012   Procedure: DIAGNOSTIC LAPAROSCOPY AND SIGMOIDMOIDOSCOPY ;  Surgeon: Enid Harry, MD;  Location: [facility];  Service: General;  Laterality: N/A;   RECTAL ULTRASOUND N/A 07/13/2016   Procedure: RECTAL ULTRASOUND;  Surgeon: Joyce Nixon, MD;  Location: [facility];  Service: Endoscopy;  Laterality: N/A;   TONSILLECTOMY     TONSILLECTOMY AND ADENOIDECTOMY     Past Surgical History:  Procedure Laterality Date   ANAL RECTAL MANOMETRY N/A 07/13/2016   Procedure: ANO RECTAL MANOMETRY;  Surgeon: Joyce Nixon, MD;  Location: [facility];  Service: Endoscopy;  Laterality: N/A;   CESAREAN SECTION     EUS N/A 11/21/2012   Procedure: LOWER ENDOSCOPIC ULTRASOUND (EUS);  Surgeon: Evangeline Hilts, MD;  Location: [facility];  Service: Endoscopy;  Laterality: N/A;   FLEXIBLE SIGMOIDOSCOPY N/A 11/21/2012   Procedure: FLEXIBLE SIGMOIDOSCOPY;  Surgeon: Evangeline Hilts, MD;  Location: [facility];  Service: Endoscopy;  Laterality: N/A;   FLEXIBLE SIGMOIDOSCOPY N/A 01/28/2013   Procedure: FLEXIBLE  SIGMOIDOSCOPY;  Surgeon: Joyce Nixon, MD;  Location: [facility];  Service: Endoscopy;  Laterality: N/A;   LAPAROSCOPIC LOW ANTERIOR RESECTION N/A 01/29/2013   Procedure: LAPAROSCOPIC LOW ANTERIOR RESECTION, Rigid Proctoscopy;  Surgeon: Joyce Nixon, MD;  Location: [facility];  Service: General;  Laterality: N/A;   LAPAROSCOPIC SIGMOID COLECTOMY N/A 11/14/2012   Procedure: DIAGNOSTIC LAPAROSCOPY AND SIGMOIDMOIDOSCOPY ;  Surgeon: Enid Harry, MD;  Location: [facility];  Service: General;  Laterality: N/A;   RECTAL ULTRASOUND N/A 07/13/2016   Procedure: RECTAL ULTRASOUND;  Surgeon: Joyce Nixon,  MD;  Location: [facility];  Service: Endoscopy;  Laterality: N/A;   TONSILLECTOMY     TONSILLECTOMY AND ADENOIDECTOMY     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   GERD (gastroesophageal reflux disease)    with chronic cough   Headache(784.0)    mild   Hypertension    Loop Biotronik loop implant 12/16/2020 12/16/2020   Mental disorder    depression   Neuropathy    feet    Pain    arthritis pain - takes tramadol  as needed   Pulmonary fibrosis, unspecified (HCC)    Rectal cancer (HCC)    Rectal polyp    very little bleeding with bowel movements- no pain   Stroke (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   Suicide attempt (HCC)    BP 119/79   Pulse 71   Ht 5' 2 (1.575 m)   Wt 163 lb 6.4 oz (74.1 kg)   SpO2 98%   BMI 29.89 kg/m   Opioid Risk Score:   Fall Risk Score:  `1  Depression screen [facility] 2/9     12/01/2023   10:56 AM 10/19/2023   10:30 AM 04/08/2022   10:57 AM 11/26/2021    2:15 PM 04/20/2021    2:17 PM 12/11/2020    3:28 PM 12/03/2020    9:41 AM  Depression screen PHQ 2/9  Decreased Interest 1 1 1 1 1 2  0  Down, Depressed, Hopeless 1 1 1 1 1 1 1   PHQ - 2 Score 2 2 2 2 2 3 1   Altered sleeping  0     1  Tired, decreased  energy  1     3  Change in appetite  0     2  Feeling bad or failure about yourself   0     0  Trouble concentrating  0     3  Moving slowly or fidgety/restless  3     1  Suicidal thoughts  0     0  PHQ-9 Score  6     11  Difficult doing work/chores  Not difficult at all     Very difficult     Review of Systems  Musculoskeletal:  Positive for gait problem.       Left knee pain  All other systems reviewed and are negative.      Objective:   Physical Exam  Motor strength is 4+ bilateral deltoid, bicep, tricep, grip, hip flexion, knee extension, ankle dorsiflexion plantarflexion There is mild dysmetria with finger-nose-finger testing left upper extremity, no evidence of dysmetria right upper extremity No evidence of dysdiadochokinesis with rapid alternating movements of the upper extremity pronator supinator Ambulates with a cane slow step length widened base support no evidence of toe drag and instability Speech with mild dysarthria Increased latency of response Neurologic exam appears stable compared to previous visit on 10/19/2023 Reduced sensation right lower facial Neuro:  Eyes without evidence of nystagmus  Tone is normal without evidence of spasticity  Cranial nerves II- Visual fields are intact to confrontation testing, no blurring of vision III- no evidence of ptosis, upward, downward and medial gaze intact IV- no vertical diplopia or head tilt V- no facial numbness or masseter weakness VI- no pupil abduction weakness VII- no facial droop, good lid closure VII-  normal auditory acuity IX- no pharygeal weakness, gag nl X- no pharyngeal weakness, no hoarseness XI- no trap or SCM weakness XII- no glossal weakness      Assessment & Plan:   1.  Patient with history of multiple CVAs bilateral subcortical with small vessel disease etiology.  Patient is on clopidogrel  she has done relatively well from a functional standpoint.  Most recently she has had a couple choking  episodes.  She has had issues with swallowing while on the inpatient side and according to speech therapy there may be an apraxic component.  In addition she also has decreased sensation right side of her oral cavity and pharynx as well as right lower facial Will make referral to outpatient speech therapy The patient has an appointment with neurology next week which I have encouraged her to keep.  They may address the headaches, consider Topamax .

## 2023-12-05 NOTE — Progress Notes (Unsigned)
 [facility] [address] [place]. [place] [address] [phone]       OFFICE FOLLOW UP NOTE  Erica [name] Date of Birth:  25-May-1956 Medical Record Number:  [id number]   Reason for visit: hospital stroke follow up    SUBJECTIVE:   CHIEF COMPLAINT:  Chief Complaint  Patient presents with   Cerebrovascular Accident    Rm 3 alone Pt is well, reports she is still having residual L sided weakness and daily headaches. No other concerns.      HPI:   Update 12/06/2023 JM: Patient returns for scheduled follow-up visit but unfortunately was hospitalized on 09/25/2023 after presenting with right tongue and right groin numbness, difficulty walking and AMS with evidence of left thalamic infarct likely secondary to small vessel disease.  Loop recorder interrogated without evidence of A-fib.  CTA head/neck unremarkable.  EF 55 to 60%.  On aspirin  PTA, recommended DAPT for 3 weeks on Plavix  alone, continue Crestor  40 mg daily and added Zetia  for LDL 78.  A1c 5.6.  Discharged to CIR for 12 days prior to returning home.  Currently, reports residual numbness in lips, tongue and jaw. She can have painful sensation that radiates from side of mouth into ear at times. She also reports swelling in right jaw, recently saw dentist without concerning findings.  She denies any numbness or weakness in right upper or lower extremity. She does report difficulty swallowing even on saliva, has frequent choking episodes. She did complete swallow study during CIR stay and recommended Dys 2 diet but admits to not maintaining this. Dr. Carilyn referred her to SLP recently, has not yet scheduled appointment. Reports she completed HH SLP and OT last week, has 1 more session of HH PT. She is concerned regarding continued left sided weakness from prior stroke, she also reports continued short term memory difficulty and speech difficulty. Ambulates with cane, no recent falls. Does live alone, trying  to get aide assistance as at times she has difficulty fully maintaining ADLs and IADLs.  She does not drive, she took an Pharmacist, community to todays appointment. She also mentions have daily headaches over the past several weeks, located right occipital/parietal area, reports they last until she takes something such as Tylenol  but also reports Tylenol  doesn't seem to help. Denies photophobia, phonophobia or N/V with headaches. Denies vision changes or new/worsening stroke/TIA symptoms since onset of headaches. Completed 3 weeks DAPT, remains on Plavix  alone as well as Crestor  and Zetia . She also reports occasional use of aspirin  as advised by PCP.  Blood pressure well-controlled.  Routinely follows with PCP for stroke risk factor management. She is scheduled for pulmonology consult next week to evaluate for possible underlying sleep apnea.       History provided for reference purposes only Update 04/20/2023 JM: Patient returns for hospital follow-up.  She presented to [facility] ED with altered mental status on 03/04/2023. CTH negative for acute stroke.  MRI brain showed small acute to subacute bilateral centrum semiovale infarcts.  MRA brain normal.  Carotid duplex less than 50% stenosis bilateral ICAs, minimal calcified plaque at left ICA origin.  EF 65 to 70%.  Evaluated by neurology team, suspected infarcts bilateral small vessel disease.  Recommended DAPT for 3 weeks then aspirin  alone and increase Crestor  from 20 mg to 40 mg daily. LDL 86. A1c 6.9.  She was discharged to CIR.  She returned to ED 10/5 after syncopal episode.  Noted orthostatic hypotension likely contributing to syncopal event.  CTH negative.  CTA head/neck minimal arthrosclerotic calcifications of bilateral ICAs without significant stenosis otherwise unremarkable.  MRI brain showed acute left thalamic infarct and multiple chronic infarcts including bilateral occipital lobe infarcts, watershed territory infarcts and diffuse subcortical white matter disease,  and bilateral basal ganglia and cerebellar infarcts.  She was seen by the neurology team who felt etiology likely small vessel disease.  Recommended DAPT for 21 days and continue Crestor .  Loop recorder interrogated without evidence of A-fib.  She was discharged with home health therapies.  Currently, she reports being stable since discharge.  She reports residual difficulty with speech, worsening left-sided weakness weakness (compared to baseline from prior stroke) and some imbalance. Has not been able to start therapy due to difficulty scheduling.  Ambulates with cane, no recent falls. Lives in own home but does have son and sister to assist as needed.  She does not drive, was brought to todays visit by family, waiting in waiting room. Denies new stroke/TIA symptoms.   She does report excessive daytime fatigue even prior to recent strokes.  Has difficulty falling asleep at night, will use trazodone  if needed but prefers to avoid if able as this causes night terrors.  Has been told that she snores.  Unsure if witnessed apneas as she sleeps alone.  She did undergo prior sleep study in 2018 but she reports she did not sleep well so they were not able to get accurate results, did review results which did show concern of OSA.   Completed 3 weeks DAPT, remains on aspirin  as advised and Crestor  without side effects.  Routinely follows with PCP for stroke risk factor management - reports starting Actos yesterday by PCP and stopping insulin  but she is concerned glucose levels elevated this morning, she was encouraged to reach out to their office to further discuss.  Blood pressure today slightly elevated, routinely monitors at home and can fluctuate typically ranging 100-140s/70-80s, reports BP managed by PCP. Was seen by cardiology on 11/6, was referred to hematology for hypercoagulable workup given recurrent strokes, has initial visit 12/18.  Loop recorder has not shown evidence of A-fib thus far.  Update  12/02/2021 JM: Patient returns for stroke follow-up after prior visit 7 months ago.  She did present to ED on 6/12 with complaints of worsening left-sided facial numbness and gradual left leg weakness throughout the day as well as walking difficulty on 6/10, due to persisting she proceeded to ED.  Completed MRI brain which was negative for acute stroke.  Symptoms gradually improved prior to discharge. Lab work markable except elevated creatinine level advise follow-up with cardiology to discuss Entresto .  She has been doing since then. Does confirm she returned back to baseline functioning prior to leaving.  Feels as though left-sided weakness has improved since prior visit.  Ambulating with cane, denies any recent falls.  Does still have mild left hand numbness and left lip tingling.  She is concerned regarding her short-term memory which has been poor since her stroke. She will forget recent conversations or walk in to a room and forget why she went in to the room. No issues with long term.  Does do word searches at home. Does admit to some underlying anxiety and difficulty concentrating.  Is currently on sertraline  and venlafaxine  managed by PCP.  Sleep is poor with longstanding insomnia, has sleep study in 2018 which was negative for sleep apnea (per pt report, unable to view results via epic). Reports her mother had Alzheimer's disease although never formally diagnosed and  did have a large stroke in her 70's which was around the time she started to have memory loss. She is fearful that she may be developing Alzheimer's dementia and would like to see a psychologist for such.   Reports compliance on aspirin  and Crestor , denies side effects.  Blood pressure today 145/85.  Reports recent A1c 5.2.  Routinely followed by PCP.   No further concerns at this time  Update 04/29/2021 JM: Returns for 3-month stroke follow-up unaccompanied  Continues to work with therapies making gradual improving of left sided  weakness. Continues to use RW and at times her cane. Denies any recent falls.  Denies new stroke/TIA symptoms. Her son continues to live with her but able to maintain ADLs and majority of IADLs independently.   Compliant on aspirin  and crestor  - denies side effects Blood pressure today slightly increased - reports increased stressors today but monitors at home and typically stable Glucose levels stable at home -has not had recent A1c check Recent direct LDL 37  Plans to contact PCP to schedule follow-up visit Routinely followed by pulmonology for pulmonary fibrosis  No new concerns at this time  Update 12/30/2020 JM: Erica [name] returns for hospital follow-up regarding recent stroke on 11/03/2020.  She presented with 2-3 day hx of headaches and worsening left-sided weakness.  Personally reviewed hospitalization pertinent progress notes, lab work and imaging summary provided.  Evaluated by Dr. Rosemarie with stroke work-up revealing right parietal occipital stroke likely due to small vessel disease although unable to rule out cardioembolic source given recent right carotid head and right anterior lateral thalamic infarcts.  MRA negative LVO.  Carotid Dopplers unremarkable.  Normal EF.  Loop recorder recommended but declined by patient inpatient.  Prior 14-day cardiac monitor completed which was negative for A. fib.  On Plavix  PTA and recommended DAPT for 3 weeks and aspirin  alone.  HTN stable.  LDL 56 on Crestor  20 mg daily.  Controlled DM with A1c 5.2.  Other stroke risk factors include recent stroke (see below), advanced age, former tobacco use and CAD.  Discharged to CIR on 11/06/2020 -11/21/2020.  Since discharge, she has been making gradual improvements.  Currently working with home health PT for residual left-sided weakness and left sided numbness. She has since completed SLP. She has also been having headaches since her stroke - has been taking tramadol  with benefit which was started by PMR.  Continues to  ambulate with RW at all times -denies any recent falls. Her son is currently living with her. Able to maintain ADLs independently. Completed 3 weeks DAPT and remains on aspirin  alone as well as Crestor  without associated side effects.  Blood pressure today 128/79.  Had loop recorder placed by Dr. Michele 7/6.   Sister jointed visit towards the end of visit - she is concerned regarding worsening depression and living situation as son does not assist with patients care.  She has not had recent visit with PCP.  She is currently on trazodone , venlafaxine , and buspirone   Initial visit 08/19/2020 JM: Erica [name] is being seen for hospital follow-up accompanied by her son.  She was discharged home from CIR on 07/15/2020.  Reports residual left sided weakness with numbness/tingling and left lower facial numbness Working with [employer] PT/OT 2x/ week -reports continued improvement since discharge Currently ambulating with rolling walker but she was not using previously -denies any recent falls Lives with her son who will assist as needed but she has been slowly becoming more independent with ADLs Denies new  stroke/TIA symptoms  Completed 3 weeks DAPT and remains on Plavix  alone -denies side effects Reports compliance on atorvastatin  40 mg daily -denies side effects Blood pressure today satisfactory 127/78 -monitors at home and typically stable Glucose levels routinely monitored at home which have been fluctuating per patient report  She has not had follow-up with PCP or cardiology since discharge  No further concerns at this time  Stroke admission 06/25/2020: Erica [name] is a 68 y.o. female with history of hypertension, hypercholesteremia, diabetes complicated by neuropathy, CAD, depression C/V prior suicide attempt, arthritis, 10-pack-year history of smoking quit in 2020, COPD/asthma, polysubstance abuse who presented to [facility] ED on 06/25/2020 with slurred speech on and off for past week and left leg weakness.     Personally reviewed hospitalization pertinent progress notes, lab work and imaging with summary provided.  Evaluated by Dr. Jerri with stroke work-up revealing right caudate head and right anterior lateral thalamic infarcts, likely secondary to small vessel disease however given 2 separate locations unable to rule out embolic source with 30-day cardiac event monitor outpatient.  Recommended DAPT for 3 weeks and Plavix  alone.  LDL 86 -increase atorvastatin  from 20 mg to 40 mg daily and ongoing use of fenofibrate  and Vascepa .  Controlled DM with A1c 5.2.  Other stroke risk factors include advanced age, obesity, CAD, hx of cocaine use and former tobacco use.  Residual deficits of left hemiparesis and left hemisensory impairment.  Evaluated by therapies and discharged to CIR for ongoing therapy needs  Stroke: Lacunar infarcts in right caudate head and right anterior lateral thalamus, likely due to small vessel disease. However, given two separate location will need to rule out emoblic source CT head: Lacunar infarcts in the right caudate and right cerebellum, new from 2012, but age indeterminate. MRI : 1 cm acute infarction in the anterolateral thalamus on the right, possibly with some involvement of the posterior limb internal capsule. Acute infarction within the right caudate head.  MRA : Both internal carotid arteries widely patent through the skull base and siphon regions. No stenosis.  Carotid Doppler: Velocities in the left and right ICA are consistent with a 1-39%  stenosis.  2D Echo: Left ventricular ejection fraction, by estimation, is 55 to 60%.  30 day cardiac event monitoring as outpt to rule out afib LDL 86 HgbA1c 5.2 RPR/HIV neg VTE prophylaxis -Lovenox  30 mg aspirin  81 mg daily prior to admission, now on aspirin  81 mg daily and Plavix  DAPT for 3 weeks, then Plavix  75 mg alone.  Therapy recommendations: CIR Disposition: d/c to CIR on 06/30/2020       ROS:   14 system review of systems  performed and negative with exception of those listed in HPI  PMH:  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   GERD (gastroesophageal reflux disease)    with chronic cough   Headache(784.0)    mild   Hypertension    Loop Biotronik loop implant 12/16/2020 12/16/2020   Mental disorder    depression   Neuropathy    feet    Pain    arthritis pain - takes tramadol  as needed   Pulmonary fibrosis, unspecified (HCC)    Rectal cancer (HCC)    Rectal polyp    very little bleeding with bowel movements- no  pain   Stroke (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   Suicide attempt (HCC)     PSH:  Past Surgical History:  Procedure Laterality Date   ANAL RECTAL MANOMETRY N/A 07/13/2016   Procedure: ANO RECTAL MANOMETRY;  Surgeon: Bernarda Ned, MD;  Location: [facility];  Service: Endoscopy;  Laterality: N/A;   CESAREAN SECTION     EUS N/A 11/21/2012   Procedure: LOWER ENDOSCOPIC ULTRASOUND (EUS);  Surgeon: Elsie Cree, MD;  Location: [facility];  Service: Endoscopy;  Laterality: N/A;   FLEXIBLE SIGMOIDOSCOPY N/A 11/21/2012   Procedure: FLEXIBLE SIGMOIDOSCOPY;  Surgeon: Elsie Cree, MD;  Location: [facility];  Service: Endoscopy;  Laterality: N/A;   FLEXIBLE SIGMOIDOSCOPY N/A 01/28/2013   Procedure: FLEXIBLE SIGMOIDOSCOPY;  Surgeon: Bernarda Ned, MD;  Location: [facility];  Service: Endoscopy;  Laterality: N/A;   LAPAROSCOPIC LOW ANTERIOR RESECTION N/A 01/29/2013   Procedure: LAPAROSCOPIC LOW ANTERIOR RESECTION, Rigid Proctoscopy;  Surgeon: Bernarda Ned, MD;  Location: [facility];  Service: General;  Laterality: N/A;   LAPAROSCOPIC SIGMOID COLECTOMY N/A 11/14/2012   Procedure: DIAGNOSTIC LAPAROSCOPY AND SIGMOIDMOIDOSCOPY ;  Surgeon: Donnice Bury, MD;  Location: [facility];  Service: General;  Laterality: N/A;   RECTAL ULTRASOUND N/A  07/13/2016   Procedure: RECTAL ULTRASOUND;  Surgeon: Bernarda Ned, MD;  Location: [facility];  Service: Endoscopy;  Laterality: N/A;   TONSILLECTOMY     TONSILLECTOMY AND ADENOIDECTOMY      Social History:  Social History   Socioeconomic History   Marital status: Single    Spouse name: Not on file   Number of children: 1   Years of education: Not on file   Highest education level: Not on file  Occupational History   Not on file  Tobacco Use   Smoking status: Former    Current packs/day: 0.00    Average packs/day: 0.5 packs/day for 20.0 years (10.0 ttl pk-yrs)    Types: Cigarettes    Start date: 01/05/1998    Quit date: 01/05/2018    Years since quitting: 5.9   Smokeless tobacco: Never   Tobacco comments:    Unknown   Vaping Use   Vaping status: Never Used  Substance and Sexual Activity   Alcohol  use: Not Currently   Drug use: Not Currently    Types: Crack cocaine    Comment: Unknown    Sexual activity: Not Currently    Birth control/protection: None  Other Topics Concern   Not on file  Social History Narrative   12/30/20 lives with others   Social Drivers of Health   Financial Resource Strain: Not on file  Food Insecurity: No Food Insecurity (09/26/2023)   Hunger Vital Sign    Worried About Running Out of Food in the Last Year: Never true    Ran Out of Food in the Last Year: Never true  Transportation Needs: No Transportation Needs (09/26/2023)   PRAPARE - Administrator, Civil Service (Medical): No    Lack of Transportation (Non-Medical): No  Physical Activity: Not on file  Stress: Not on file  Social Connections: Moderately Isolated (09/26/2023)   Social Connection and Isolation Panel    Frequency of Communication with Friends and Family: Once a week    Frequency of Social Gatherings with Friends and Family: Once a week    Attends Religious Services: 1 to 4 times per year    Active Member of [employer] or Organizations: Yes    Attends Tax inspector Meetings: 1 to 4 times per year  Marital Status: Divorced  Catering manager Violence: Not At Risk (09/26/2023)   Humiliation, Afraid, Rape, and Kick questionnaire    Fear of Current or Ex-Partner: No    Emotionally Abused: No    Physically Abused: No    Sexually Abused: No    Family History:  Family History  Problem Relation Age of Onset   Hypertension Father    Stroke Father    Diabetes Father    Heart disease Mother    Hypertension Mother    Hyperlipidemia Mother    Hypertension Sister    Hypertension Brother    Hypertension Sister    Hypertension Brother    Cancer Other 55       colon cancer    Cancer Maternal Aunt        cancer, unknown type    Cancer Maternal Uncle        bone cancer    Cancer Paternal Aunt        lung cancer   Cancer Paternal Uncle        lung cancer   Cancer Maternal Uncle        colon cancer and prostate cancer    Cancer Maternal Uncle        prostate cancer     Medications:   Current Outpatient Medications on File Prior to Visit  Medication Sig Dispense Refill   acetaminophen  (TYLENOL ) 325 MG tablet Take 2 tablets (650 mg total) by mouth every 4 (four) hours as needed for mild pain (pain score 1-3) (or temp > 37.5 C (99.5 F)).     albuterol  (VENTOLIN  HFA) 108 (90 Base) MCG/ACT inhaler Inhale 2 puffs into the lungs every 4 (four) hours as needed for wheezing. 6.7 g 5   amLODipine  (NORVASC ) 10 MG tablet Take 1 tablet (10 mg total) by mouth daily. 30 tablet 0   cetirizine  (ZYRTEC ) 10 MG tablet Take 1 tablet (10 mg total) by mouth daily. 30 tablet 0   clopidogrel  (PLAVIX ) 75 MG tablet Take 1 tablet (75 mg total) by mouth daily. 30 tablet 0   dapagliflozin  propanediol (FARXIGA ) 10 MG TABS tablet Take 1 tablet (10 mg total) by mouth daily. 30 tablet 0   diclofenac  Sodium (VOLTAREN ) 1 % GEL Apply 1 Application topically 2 (two) times daily as needed (pain). 100 g 0   ezetimibe  (ZETIA ) 10 MG tablet Take 1 tablet (10 mg total) by mouth  daily. 30 tablet 0   gabapentin  (NEURONTIN ) 100 MG capsule Take 1 capsule (100 mg total) by mouth 3 (three) times daily. 90 capsule 0   hydrALAZINE  (APRESOLINE ) 25 MG tablet Take 1 tablet (25 mg total) by mouth 3 (three) times daily. 90 tablet 0   insulin  glargine (LANTUS ) 100 UNIT/ML Solostar Pen Inject 10 Units into the skin daily. Pen expires 28 days after first use 15 mL 11   Insulin  Pen Needle 32G X 4 MM MISC Use with Lantus  pen 100 each 0   melatonin 3 MG TABS tablet Take 1 tablet (3 mg total) by mouth at bedtime. 30 tablet 0   naphazoline-glycerin  (CLEAR EYES REDNESS) 0.012-0.25 % SOLN Place 1-2 drops into both eyes 3 (three) times daily with meals. (Patient taking differently: Place 2 drops into both eyes daily as needed for eye irritation.)     pantoprazole  (PROTONIX ) 40 MG tablet Take 1 tablet (40 mg total) by mouth daily. 30 tablet 0   Pirfenidone  801 MG TABS TAKE 1 TABLET (801 MG) BY MOUTH WITH BREAKFAST, WITH LUNCH,  AND WITH EVENING MEAL. (Patient taking differently: Take 1 tablet by mouth in the morning and at bedtime.) 270 tablet 0   rosuvastatin  (CRESTOR ) 40 MG tablet Take 1 tablet (40 mg total) by mouth daily. 30 tablet 0   sertraline  (ZOLOFT ) 50 MG tablet Take 1 tablet (50 mg total) by mouth daily. 30 tablet 0   SYMBICORT  160-4.5 MCG/ACT inhaler Inhale 2 puffs into the lungs 2 (two) times daily. 10.2 g 5   venlafaxine  XR (EFFEXOR -XR) 150 MG 24 hr capsule Take 1 capsule (150 mg total) by mouth daily. 30 capsule 0   No current facility-administered medications on file prior to visit.    Allergies:   Allergies  Allergen Reactions   Penicillins Hives, Itching and Swelling    Tongue swelling      OBJECTIVE:  Physical Exam  Vitals:   12/06/23 0812  BP: 134/84  Pulse: 76  Weight: 161 lb (73 kg)  Height: 5' 3 (1.6 m)   Body mass index is 28.52 kg/m. No results found.  General: well developed, well nourished,  very pleasant elderly African-American female, seated,  in no evident distress  Neurologic Exam Mental Status: Awake and fully alert.  Mild dysarthria with speech hesitancy.  No evidence of aphasia.  Oriented to place and time. Recent memory impaired and remote memory intact. Attention span, concentration and fund of knowledge appropriate during visit. Mood and affect appropriate.  Cranial Nerves: Pupils equal, briskly reactive to light. Extraocular movements full without nystagmus. Visual fields full to confrontation. Hearing intact.  Decreased light touch left lower facial sensation.  Slight left lower facial weakness.  Tongue and palate moves normally and symmetrically.  Motor: Normal bulk and tone and normal strength on the right side.  4/5 left hemiparesis although questionable effort and some giveaway weakness Sensory.:  subjective odd feeling with light touch LUE and LLE compared to right side Coordination: Rapid alternating movements impaired L>R.  Gait and Station: Arises from chair without difficulty. Stance is normal. Gait demonstrates slightly decreased step height of LLE with use of cane, mild unsteadiness initially.  Unable to complete tandem walk or heel toe.   Reflexes: 1+ and symmetric. Toes downgoing.          ASSESSMENT: AHRIA SLAPPEY is a 68 y.o. year old female with multiple recurrent strokes, most recently 09/2023 left thalamic stroke likely secondary to small vessel disease and previously bilateral centrum semiovale infarcts 02/12/2023 and left thalamic infarct on 03/18/2023 likely secondary to small vessel disease, recurrent right MCA strokes on 11/03/2020 and 06/25/2020 likely in setting of small vessel disease although unable to rule out cardioembolic source s/p Loop recorder 12/2020 by Dr. Michele [facility] cardiology).  Vascular risk factors include HTN, HLD, DM, CAD, history of tobacco use and history of polysubstance abuse and multiple prior strokes noted on imaging.       PLAN:  History of multiple strokes:  Residual  deficit: right facial numbness/pain and swallowing difficulty since recent stroke. Chronic mild left hemiparesis, gait impairment, dysarthria, and short term memory difficulties.  Advised to complete [facility] PT, consider outpatient PT if needed once completed Advised to schedule f/u visit with SLP upon leaving building today for swallowing concerns Continued use of cane at all times for fall prevention  Continue Plavix , Zetia  and Crestor  for secondary stroke prevention managed/prescribed by PCP.  Advised to discontinue aspirin  as prolonged DAPT not indicated from stroke standpoint. S/p ILR 12/16/2020 - monitored by [facility] cardiology Dr. Michele.  No evidence of A-fib  thus far Hypercoag workup negative for underlying clotting disorder discussed secondary stroke prevention measures and importance of close PCP follow up for aggressive stroke risk factor management including BP goal<130/90, HLD with LDL goal<70 and DM with A1c.<7  Stroke labs 09/2023 LDL 78, A1c 5.6  Tension type headaches: Present over the past 2 weeks, no red flag symptoms and neuro exam intact therefore will hold off on repeat imaging at this time but can consider if headaches persist. Patient agrees with plan.  Recommend starting topiramate  25 mg twice daily.  Discussed potential side effects and to call with any difficulty tolerating.  Consider increasing dosage after 2 to 3 weeks if needed.  Advised this may also help with right sided facial pain likely post stroke.  Advised to limit use of OTC pain relieves as this can cause rebound headache Currently on gabapentin  and venlafaxine   At risk for sleep apnea: has evaluation with pulmonology 12/12/2023. Discussed risk of untreated sleep apnea and importance of pursing this evaluation.      Follow-up in 4 months or call earlier if needed    CC:  PCP: Delores Rojelio Caldron, NP    I personally spent a total of 55 minutes in the care of the patient today including preparing to see the  patient, getting/reviewing separately obtained history, performing a medically appropriate exam/evaluation, counseling and educating, placing orders, and documenting clinical information in the EHR. Time also spend reviewing recent hospitalization.  Harlene Bogaert, AGNP-BC  [facility] [address] [place], [place] [address]  Phone [phone] Fax [phone] Note: This document was prepared with digital dictation and possible smart phrase technology. Any transcriptional errors that result from this process are unintentional.

## 2023-12-05 NOTE — Progress Notes (Signed)
 [facility] Summary Report / Loop Recorder

## 2023-12-06 ENCOUNTER — Encounter: Payer: Self-pay | Admitting: Adult Health

## 2023-12-06 ENCOUNTER — Ambulatory Visit (INDEPENDENT_AMBULATORY_CARE_PROVIDER_SITE_OTHER): Admitting: Adult Health

## 2023-12-06 VITALS — BP 134/84 | HR 76 | Ht 63.0 in | Wt 161.0 lb

## 2023-12-06 DIAGNOSIS — Z9189 Other specified personal risk factors, not elsewhere classified: Secondary | ICD-10-CM

## 2023-12-06 DIAGNOSIS — I69398 Other sequelae of cerebral infarction: Secondary | ICD-10-CM | POA: Diagnosis not present

## 2023-12-06 DIAGNOSIS — I69354 Hemiplegia and hemiparesis following cerebral infarction affecting left non-dominant side: Secondary | ICD-10-CM | POA: Diagnosis not present

## 2023-12-06 DIAGNOSIS — G44209 Tension-type headache, unspecified, not intractable: Secondary | ICD-10-CM | POA: Diagnosis not present

## 2023-12-06 DIAGNOSIS — M792 Neuralgia and neuritis, unspecified: Secondary | ICD-10-CM

## 2023-12-06 DIAGNOSIS — Z8673 Personal history of transient ischemic attack (TIA), and cerebral infarction without residual deficits: Secondary | ICD-10-CM

## 2023-12-06 MED ORDER — TOPIRAMATE 25 MG PO TABS: 25.0000 mg | ORAL_TABLET | Freq: Two times a day (BID) | ORAL | 11 refills | Status: DC

## 2023-12-06 NOTE — Patient Instructions (Addendum)
 Recommend starting topiramate  25 mg twice daily for headache prevention - if headaches persist over the next couple of weeks, please let me know and we can consider dosage increase. Please let me know sooner with any difficulty tolerating.   Suspect facial numbness and pain secondary to your prior stroke. Hopefully the topamax  will also help with this pain.   Schedule evaluation with outpatient speech therapy upon leaving office today   Continue working with PT, if you wish to do additional outpatient therapy, please let me know   Continue clopidogrel  75 mg daily, Crestor  and Zetia  for secondary stroke prevention. Please discontinue aspirin  as prolonged DAPT (aspirin  and plavix  therapy) is not recommended from a stroke standpoint   Follow up with pulmonology next week as scheduled to evaluate for underlying sleep apnea   Continue to follow up with PCP regarding blood pressure, diabetes and cholesterol management  Maintain strict control of hypertension with blood pressure goal below 130/90, diabetes with A1c goal <7 and cholesterol with LDL cholesterol (bad cholesterol) goal below 70 mg/dL.   Signs of a Stroke? Follow the BEFAST method:  Balance Watch for a sudden loss of balance, trouble with coordination or vertigo Eyes Is there a sudden loss of vision in one or both eyes? Or double vision?  Face: Ask the person to smile. Does one side of the face droop or is it numb?  Arms: Ask the person to raise both arms. Does one arm drift downward? Is there weakness or numbness of a leg? Speech: Ask the person to repeat a simple phrase. Does the speech sound slurred/strange? Is the person confused ? Time: If you observe any of these signs, call 911.     Followup in the future with me in 4 months or call earlier if needed      Thank you for coming to see us  at [facility]. I hope we have been able to provide you high quality care today.  You may receive a patient  satisfaction survey over the next few weeks. We would appreciate your feedback and comments so that we may continue to improve ourselves and the health of our patients.    Topiramate  Tablets What is this medication? TOPIRAMATE  (toe PYRE a mate) prevents and controls seizures in people with epilepsy. It may also be used to prevent migraine headaches. It works by calming overactive nerves in your body. This medicine may be used for other purposes; ask your health care provider or pharmacist if you have questions. COMMON BRAND NAME(S): Topamax , Topiragen What should I tell my care team before I take this medication? They need to know if you have any of these conditions: Bleeding disorder Kidney disease Lung disease Suicidal thoughts, plans, or attempt by you or a family member An unusual or allergic reaction to topiramate , other medications, foods, dyes, or preservatives Pregnant or trying to get pregnant Breast-feeding How should I use this medication? Take this medication by mouth with water. Take it as directed on the prescription label at the same time every day. Do not cut, crush or chew this medicine. Swallow the tablets whole. You can take it with or without food. If it upsets your stomach, take it with food. Keep taking it unless your care team tells you to stop. A special MedGuide will be given to you by the pharmacist with each prescription and refill. Be sure to read this information carefully each time. Talk to your care team about the use of this medication in children.  While it may be prescribed for children as young as 2 years for selected conditions, precautions do apply. Overdosage: If you think you have taken too much of this medicine contact a poison control center or emergency room at once. NOTE: This medicine is only for you. Do not share this medicine with others. What if I miss a dose? If you miss a dose, take it as soon as you can unless it is within 6 hours of the next  dose. If it is within 6 hours of the next dose, skip the missed dose. Take the next dose at the normal time. Do not take double or extra doses. What may interact with this medication? Acetazolamide Alcohol  Antihistamines for allergy, cough, and cold Aspirin  and aspirin -like medications Atropine Certain medications for anxiety or sleep Certain medications for bladder problems, such as oxybutynin, tolterodine Certain medications for depression, such as amitriptyline, fluoxetine, sertraline  Certain medications for Parkinson disease, such as benztropine, trihexyphenidyl Certain medications for seizures, such as carbamazepine, lamotrigine, phenobarbital, phenytoin, primidone, valproic acid, zonisamide Certain medications for stomach problems, such as dicyclomine, hyoscyamine Certain medications for travel sickness, such as scopolamine Certain medications that treat or prevent blood clots, such as warfarin, enoxaparin , dalteparin, apixaban , dabigatran, rivaroxaban Digoxin Diltiazem Estrogen and progestin hormones General anesthetics, such as halothane, isoflurane, methoxyflurane, propofol  Glyburide Hydrochlorothiazide  Ipratropium Lithium Medications that relax muscles Metformin  NSAIDs, medications for pain and inflammation, such as ibuprofen or naproxen Opioid medications for pain Phenothiazines, such as chlorpromazine, mesoridazine, prochlorperazine , thioridazine Pioglitazone This list may not describe all possible interactions. Give your health care provider a list of all the medicines, herbs, non-prescription drugs, or dietary supplements you use. Also tell them if you smoke, drink alcohol , or use illegal drugs. Some items may interact with your medicine. What should I watch for while using this medication? Visit your care team for regular checks on your progress. Tell your care team if your symptoms do not start to get better or if they get worse. Do not suddenly stop taking this  medication. You may develop a severe reaction. Your care team will tell you how much medication to take. If your care team wants you to stop the medication, the dose may be slowly lowered over time to avoid any side effects. Wear a medical ID bracelet or chain. Carry a card that describes your condition. List the medications and doses you take on the card. This medication may affect your coordination, reaction time, or judgment. Do not drive or operate machinery until you know how this medication affects you. Sit up or stand slowly to reduce the risk of dizzy or fainting spells. Drinking alcohol  with this medication can increase the risk of these side effects. This medication may cause serious skin reactions. They can happen weeks to months after starting the medication. Contact your care team right away if you notice fevers or flu-like symptoms with a rash. The rash may be red or purple and then turn into blisters or peeling of the skin. You may also notice a red rash with swelling of the face, lips, or lymph nodes in your neck or under your arms. This medication may cause thoughts of suicide or depression. This includes sudden changes in mood, behaviors, or thoughts. These changes can happen at any time but are more common in the beginning of treatment or after a change in dose. Call your care team right away if you experience these thoughts or worsening depression. This medication may slow your child's growth if it is  taken for a long time at high doses. Your child's care team will monitor your child's growth. Using this medication for a long time may weaken your bones. The risk of bone fractures may be increased. Talk to your care team about your bone health. Discuss this medication with your care team if you may be pregnant. Serious birth defects can occur if you take this medication during pregnancy. There are benefits and risks to taking medications during pregnancy. Your care team can help you find  the option that works for you. Contraception is recommended while taking this medication. Estrogen and progestin hormones may not work as well while you are taking this medication. Your care team can help you find the option that works for you. Talk to your care team before breastfeeding. Changes to your treatment plan may be needed. What side effects may I notice from receiving this medication? Side effects that you should report to your care team as soon as possible: Allergic reactions--skin rash, itching, hives, swelling of the face, lips, tongue, or throat High acid level--trouble breathing, unusual weakness or fatigue, confusion, headache, fast or irregular heartbeat, nausea, vomiting High ammonia level--unusual weakness or fatigue, confusion, loss of appetite, nausea, vomiting, seizures Fever that does not go away, decrease in sweat Kidney stones--blood in the urine, pain or trouble passing urine, pain in the lower back or sides Redness, blistering, peeling or loosening of the skin, including inside the mouth Sudden eye pain or change in vision such as blurry vision, seeing halos around lights, vision loss Thoughts of suicide or self-harm, worsening mood, feelings of depression Side effects that usually do not require medical attention (report to your care team if they continue or are bothersome): Burning or tingling sensation in hands or feet Difficulty with paying attention, memory, or speech Dizziness Drowsiness Fatigue Loss of appetite with weight loss Slow or sluggish movements of the body This list may not describe all possible side effects. Call your doctor for medical advice about side effects. You may report side effects to FDA at 1-800-FDA-1088. Where should I keep my medication? Keep out of the reach of children and pets. Store between 15 and 30 degrees C (59 and 86 degrees F). Protect from moisture. Keep the container tightly closed. Get rid of any unused medication after the  expiration date. To get rid of medications that are no longer needed or have expired: Take the medication to a medication take-back program. Check with your pharmacy or law enforcement to find a location. If you cannot return the medication, check the label or package insert to see if the medication should be thrown out in the garbage or flushed down the toilet. If you are not sure, ask your care team. If it is safe to put it in the trash, empty the medication out of the container. Mix the medication with cat litter, dirt, coffee grounds, or other unwanted substance. Seal the mixture in a bag or container. Put it in the trash. NOTE: This sheet is a summary. It may not cover all possible information. If you have questions about this medicine, talk to your doctor, pharmacist, or health care provider.  2024 Elsevier/Gold Standard (2021-10-21 00:00:00)

## 2023-12-12 ENCOUNTER — Ambulatory Visit: Payer: Self-pay

## 2023-12-12 ENCOUNTER — Telehealth (HOSPITAL_COMMUNITY): Payer: Self-pay | Admitting: *Deleted

## 2023-12-12 ENCOUNTER — Encounter: Payer: Self-pay | Admitting: Internal Medicine

## 2023-12-12 ENCOUNTER — Encounter: Admitting: Internal Medicine

## 2023-12-12 NOTE — Telephone Encounter (Signed)
 Attempted to contact patient to schedule OP MBS. Unable to leave message @ [phone], VM not working. RKEEL

## 2023-12-12 NOTE — Telephone Encounter (Signed)
 Can you help with rescheduling this pt looks like she also needs a PFT

## 2023-12-12 NOTE — Progress Notes (Signed)
 This encounter was created in error - please disregard.

## 2023-12-12 NOTE — Telephone Encounter (Addendum)
 Pt initially called to reschedule missed [facility]BP[facility] appt today, but was transferred to NT d/t being symptomatic. Triage call was disconnected prematurely and [facility]ager was unsuccessful with getting back in contact with pt to finish triage.   Answer Assessment - Initial Assessment Questions E2C2 Pulmonary Triage - Initial Assessment Questions Chief Complaint (e.g., cough, sob, wheezing, fever, chills, sweat or additional symptoms) *Go to specific symptom protocol after initial questions. 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 A month  Have you tested for COVID or Flu? Note: If not, ask patient if a home test can be taken. If so, instruct patient to call back for positive results. No  MEDICINES:   Have you used any OTC meds to help with symptoms? No If yes, ask What medications? N/a  Have you used your inhalers/maintenance medication? Yes If yes, What medications? Albuterol  INH - 2 puffs 4x a day Symbicort ? Per chart  If inhaler, ask How many puffs and how often? Note: Review instructions on medication in the chart. See above  OXYGEN: Do you wear supplemental oxygen? No If yes, How many liters are you supposed to use? N/a  Do you monitor your oxygen levels? No If yes, What is your reading (oxygen level) today? N/a  What is your usual oxygen saturation reading?  (Note: Pulmonary O2 sats should be 90% or greater) N/a     1. RESPIRATORY STATUS: Describe your breathing? (e.g., wheezing, shortness of breath, unable to speak, severe coughing)      *No Answer* 2. ONSET: When did this breathing problem begin?      *No Answer* 3. PATTERN Does the difficult breathing come and go, or has it been constant since it started?      *No Answer* 4. SEVERITY: How bad is your breathing? (e.g., mild, moderate, severe)    -  MILD: No SOB at rest, mild SOB with walking, speaks normally in sentences, can lie down, no retractions, pulse < 100.    - MODERATE: SOB at rest, SOB with minimal exertion and prefers to sit, cannot lie down flat, speaks in phrases, mild retractions, audible wheezing, pulse 100-120.    - SEVERE: Very SOB at rest, speaks in single words, struggling to breathe, sitting hunched forward, retractions, pulse > 120      *No Answer* 5. RECURRENT SYMPTOM: Have you had difficulty breathing before? If Yes, ask: When was the last time? and What happened that time?      *No Answer* 6. CARDIAC HISTORY: Do you have any history of heart disease? (e.g., heart attack, angina, bypass surgery, angioplasty)      *No Answer* 7. LUNG HISTORY: Do you have any history of lung disease?  (e.g., pulmonary embolus, asthma, emphysema)     *No Answer* 8. CAUSE: What do you think is causing the breathing problem?      *No Answer* 9. OTHER SYMPTOMS: Do you have any other symptoms? (e.g., dizziness, runny nose, cough, chest pain, fever)     *No Answer* 10. O2 SATURATION MONITOR:  Do you use an oxygen saturation monitor (pulse oximeter) at home? If Yes, ask: What is your reading (oxygen level) today? What is your usual oxygen saturation reading? (e.g., 95%)       *No Answer* 11. PREGNANCY: Is there any chance you are pregnant? When was your last menstrual period?       *  No Answer* 12. TRAVEL: Have you traveled out of the country in the last month? (e.g., travel history, exposures)       *No Answer*  Protocols used: Breathing Difficulty-A-AH

## 2023-12-12 NOTE — Patient Instructions (Signed)
 ICD-10-CM   1. IPF (idiopathic pulmonary fibrosis) (HCC)  J84.112 Hepatic function panel    Pulmonary function test    Hepatic function panel    2. Medication monitoring encounter  Z51.81     3. Pulmonary emphysema, unspecified emphysema type (HCC)  J43.9     4. Chronic kidney disease, unspecified CKD stage  N18.9     5. Anemia, unspecified type  D64.9         Clinically pulmonary fibrosis is stable and overall toeleraring esbriet well  Liver, Anemia and kidney are stable as of Oct 2024  Glad domestic situation is better  Plan -Check cbc, bmet liver function test today 04/21/2023 ad repeat in 3 months  - cMA to put order  --Continue pirfenidone currently at full dose protocol --Continue inhaler therapy Symbicort scheduled for your emphysema  - no clinical trial for now given social situation - do spiromety and dlco in 6 months   Follow-up -Return in 6 months to see Dr. Marchelle Gearing but after PFT   Symptom sscore and walk test at followup;15 min visit

## 2023-12-12 NOTE — Telephone Encounter (Signed)
 FYI Only or Action Required?: Action required by provider: request for appointment and patient needs rescheduling from missed appointment.  Patient is followed in Pulmonology for IPF--Idiopathic Pulmonary Fibrosis, last seen on 04/21/2023 by Erica Amel, MD. Called Nurse Triage reporting Shortness of Breath. Symptoms began   Triage Disposition: See PCP Within 2 Weeks  Patient/caregiver understands and will follow disposition?: No---No appointments are available until September with Dr Erica per the CAL. CAL also advised that this visit was supposed to be a followup from PFT that it didn't look like the patient     Patient disconnected from previous nurse triage.  This RN was transferred the patient on a call back and patient states that she wanted to reschedule an appt.  When asked if anything had changed she did say that she had a stroke two months ago. Patient stayed in the hospital for two weeks at that time and has been following up with her PCP for this.  Patient was noted to have been seen 12/06/2023 for an office visit with [facility]  Patient states that she doesn't feel like she needs to go to the Emergency Room or another doctor today---she feels like she just needs to reschedule her appointment.  Patient states her sister might come over later when asked if she had anyone there with her. Patient denies chest pain, difficulty breathing, history blood clots, fever Patient states she just feels tired   CAL advised [facility] didn't have any openings until September. Patient said she missed her appt today and it was a followup. CAL advised this appt today was supposed to be a follow up a PFT which the patient appeared to have not gone to.  Patient states no falls and no changes.  Patient is advised that if anything changes or worsened to call 911 for an ambulance to take her to the Emergency Room. Patient verbalized understanding.     Reason for  Disposition  [1] MILD longstanding difficulty breathing AND [2]  SAME as normal    Patient states that nothing has changed from her normal with her breathing and that she always has a cough.  She states today was just a followup  Protocols used: Breathing Difficulty-A-AH

## 2023-12-12 NOTE — Telephone Encounter (Signed)
 Copied from CRM [id number]. Topic: Clinical - Red Word Triage >> Dec 12, 2023  1:30 PM Erica [name] wrote: Red Word that prompted transfer to Nurse Triage: SOB due to stroke.  She had appt today, but forgot.she says cannot remember things

## 2023-12-13 ENCOUNTER — Other Ambulatory Visit

## 2023-12-13 ENCOUNTER — Telehealth: Payer: Self-pay

## 2023-12-13 NOTE — Telephone Encounter (Signed)
 Copied from CRM [id number]. Topic: Appointments - Scheduling Inquiry for Clinic >> Dec 12, 2023  4:10 PM Rozanna G wrote: Reason for CRM: PT RETURNING CALL STATED SHE MISSED APPT TODAY, LOOKS LIKE SOMEONE TRIED TO CALL HER BACK, I ATTEMTPED TO RESCH BUT FIRST AVAILABLE IS SEPT. SHE ALSO STATED NO VOICEMAIL TO LEAVE MESSAGE.

## 2023-12-14 NOTE — Telephone Encounter (Signed)
 Tried calling patient but number on file did not work. Sent MyChart message for her to give our office a call.

## 2023-12-18 ENCOUNTER — Ambulatory Visit: Payer: Medicare HMO

## 2023-12-18 DIAGNOSIS — I639 Cerebral infarction, unspecified: Secondary | ICD-10-CM | POA: Diagnosis not present

## 2023-12-19 ENCOUNTER — Other Ambulatory Visit (HOSPITAL_COMMUNITY): Payer: Self-pay | Admitting: Nurse Practitioner

## 2023-12-19 DIAGNOSIS — R1310 Dysphagia, unspecified: Secondary | ICD-10-CM

## 2023-12-19 DIAGNOSIS — R059 Cough, unspecified: Secondary | ICD-10-CM

## 2023-12-19 LAB — CUP PACEART REMOTE DEVICE CHECK
Date Time Interrogation Session: 20250707121037
Implantable Pulse Generator Implant Date: 20220706
Pulse Gen Model: 436066
Pulse Gen Serial Number: 94069553

## 2023-12-21 ENCOUNTER — Ambulatory Visit
Admission: RE | Admit: 2023-12-21 | Discharge: 2023-12-21 | Disposition: A | Discharge status: Home / Self Care | Payer: Medicare HMO | Source: Ambulatory Visit | Attending: Nurse Practitioner | Admitting: Nurse Practitioner

## 2023-12-21 DIAGNOSIS — Z1231 Encounter for screening mammogram for malignant neoplasm of breast: Secondary | ICD-10-CM

## 2023-12-30 ENCOUNTER — Ambulatory Visit: Payer: Self-pay | Admitting: Cardiology

## 2024-01-01 ENCOUNTER — Ambulatory Visit: Payer: Self-pay | Admitting: Internal Medicine

## 2024-01-01 DIAGNOSIS — J84112 Idiopathic pulmonary fibrosis: Secondary | ICD-10-CM

## 2024-01-01 NOTE — Telephone Encounter (Signed)
 Attempted to call [facility] at 1:15 PM to assist with getting patient rescheduled for PFT and appointment     FYI Only or Action Required?: Action required by provider: request for appointment.  Patient is followed in Pulmonology for IPF--Idiopathic Pulmonary Fibrosis, last seen on 04/21/2023 by Geronimo Amel, MD.  Called Nurse Triage reporting Shortness of Breath.  Symptoms began patient states 2-3 weeks.  Interventions attempted: Rescue inhaler.  Symptoms are: unchanged.  Triage Disposition: See PCP Within 2 Weeks  Patient/caregiver understands and will follow disposition?: Yes          Copied from CRM [id number]. Topic: Clinical - Red Word Triage >> Jan 01, 2024  1:00 PM Joesph PARAS wrote: Red Word that prompted transfer to Nurse Triage: SOB, states would like to reschedule an acute appointment where SOB is persisting. Not worse but still occurring. Reason for Disposition  [1] MILD longstanding difficulty breathing (e.g., minimal/no SOB at rest, SOB with walking, pulse < 100) AND [2] SAME as normal  Answer Assessment - Initial Assessment Questions Patient states that nothing has changed since she missed her scheduled appointment    E2C2 Pulmonary Triage - Initial Assessment Questions Chief Complaint (e.g., cough, sob, wheezing, fever, chills, sweat or additional symptoms) *Go to specific symptom protocol after initial questions. Shortness of breath off and on depending on the weather  How long have symptoms been present? 2-3 weeks  Have you tested for COVID or Flu? Note: If not, ask patient if a home test can be taken. If so, instruct patient to call back for positive results. No  MEDICINES:   Have you used any OTC meds to help with symptoms? No If yes, ask What medications? N/A  Have you used your inhalers/maintenance medication? Yes If yes, What medications? Albuterol  Inhaler--Inhale 2 puffs into the lungs every 4 (four) hours as needed for  wheezing. Symbicort  Inhaler-- Inhale 2 puffs into the lungs 2 (two) times daily.  If inhaler, ask How many puffs and how often? Note: Review instructions on medication in the chart. Albuterol  Inhaler--Inhale 2 puffs into the lungs every 4 (four) hours as needed for wheezing. Symbicort  Inhaler-- Inhale 2 puffs into the lungs 2 (two) times daily.  OXYGEN: Do you wear supplemental oxygen? No If yes, How many liters are you supposed to use? N/A  Do you monitor your oxygen levels? No If yes, What is your reading (oxygen level) today? N/A  What is your usual oxygen saturation reading?  (Note: Pulmonary O2 sats should be 90% or greater) N/A    3. PATTERN Does the difficult breathing come and go, or has it been constant since it started?      Comes and goes 4. SEVERITY: How bad is your breathing? (e.g., mild, moderate, severe)      Patient states breathing is fine at this time but she states that she can't be outside long in this weather 5. RECURRENT SYMPTOM: Have you had difficulty breathing before? If Yes, ask: When was the last time? and What happened that time?      ---- 6. CARDIAC HISTORY: Do you have any history of heart disease? (e.g., heart attack, angina, bypass surgery, angioplasty)      Patient states that she has a cardiologist 7. LUNG HISTORY: Do you have any history of lung disease?  (e.g., pulmonary embolus, asthma, emphysema)     COPD 8. CAUSE: What do you think is causing the breathing problem?      Unknown 9. OTHER SYMPTOMS: Do you have any  other symptoms? (e.g., chest pain, cough, dizziness, fever, runny nose)     Small cough with some yellowish phlegm 10. O2 SATURATION MONITOR:  Do you use an oxygen saturation monitor (pulse oximeter) at home? If Yes, ask: What is your reading (oxygen level) today? What is your usual oxygen saturation reading? (e.g., 95%)       N/A  Protocols used: Breathing Difficulty-A-AH

## 2024-01-01 NOTE — Telephone Encounter (Signed)
 Routing to the front office to see if they can get these scheduled.

## 2024-01-03 NOTE — Telephone Encounter (Signed)
 Patient is scheduled for a PFT and f/u visit. Can Ramaswamy put in the order for the PFT so we can attach it to the appointment, thanks!

## 2024-01-03 NOTE — Telephone Encounter (Signed)
 PFT order placed  Thanks!

## 2024-01-04 ENCOUNTER — Ambulatory Visit (HOSPITAL_COMMUNITY)
Admission: RE | Admit: 2024-01-04 | Discharge: 2024-01-04 | Disposition: A | Discharge status: Home / Self Care | Source: Ambulatory Visit | Attending: *Deleted | Admitting: *Deleted

## 2024-01-04 ENCOUNTER — Telehealth: Payer: Self-pay | Admitting: Adult Health

## 2024-01-04 ENCOUNTER — Ambulatory Visit (HOSPITAL_COMMUNITY)
Admission: RE | Admit: 2024-01-04 | Discharge: 2024-01-04 | Disposition: A | Discharge status: Home / Self Care | Source: Ambulatory Visit | Attending: Nurse Practitioner | Admitting: Nurse Practitioner

## 2024-01-04 ENCOUNTER — Encounter (HOSPITAL_COMMUNITY): Payer: Self-pay

## 2024-01-04 DIAGNOSIS — R1311 Dysphagia, oral phase: Secondary | ICD-10-CM | POA: Insufficient documentation

## 2024-01-04 DIAGNOSIS — R09A2 Foreign body sensation, throat: Secondary | ICD-10-CM | POA: Diagnosis not present

## 2024-01-04 DIAGNOSIS — R1310 Dysphagia, unspecified: Secondary | ICD-10-CM

## 2024-01-04 DIAGNOSIS — R0989 Other specified symptoms and signs involving the circulatory and respiratory systems: Secondary | ICD-10-CM | POA: Diagnosis present

## 2024-01-04 DIAGNOSIS — I639 Cerebral infarction, unspecified: Secondary | ICD-10-CM | POA: Diagnosis not present

## 2024-01-04 DIAGNOSIS — R059 Cough, unspecified: Secondary | ICD-10-CM

## 2024-01-04 NOTE — Evaluation (Signed)
 Modified Barium Swallow Study  Patient Details  Name: Erica [name] MRN: [id number] Date of Birth: 1956/03/15  Today's Date: 01/04/2024  Modified Barium Swallow completed.  Full report located under Chart Review in the Imaging Section.  History of Present Illness PMH signfiicant for multiple CVAs. 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 Pt endorses globus sensation, coughing/choking with liquids>solids, eating softer foods to aid in ease of swallowing, increased vocal hoarseness, choking on saliva. Presents today with mild dysarthria.   Clinical Impression Pt presents with functional oropharyngeal swallow, with mild impairments noted for oral phase of swallow efficiency. Pt accepted PO trials including range of liquids and solids. Oral phase was notable for oral holding and lingual pumping which resulted in challenges with a/p transit of liquids and solids. Pt initiate pharyngeal swallow at level of pyriforms. Full amplitude of hyolaryngeal excursion appreciated with full laryngeal vestibule closure. She has trace pharyngeal residue, no aspiration or penetration. Barium tablet attempted but pt lacked oral coordination required for a/p transit despite multiple attempts. Pt primary barriers are timely and efficient a/p transit and eliciting pharyngeal swallow. UES distension is sufficient for full passage of bolus. Esophageal sweep limited but when no retention is appreciated following brief delay. Pt reports occasional choking on saliva, endorses anterior loss. Suspect lack of spontaneous swallowing to manage saliva contributing to this sx. Pt may benefit from OPST to address swallow deficits. Factors that may increase risk of adverse event in presence of aspiration Noe & Erica [name] 2021): Limited mobility  Swallow Evaluation  Recommendations Recommendations: PO diet PO Diet Recommendation: Dysphagia 3 (Mechanical soft);Thin liquids (Level 0) Liquid Administration via: Cup;Straw Medication Administration: Crushed with puree Supervision: Patient able to self-feed Swallowing strategies  : Small bites/sips Postural changes: Position pt fully upright for meals;Stay upright 30-60 min after meals Oral care recommendations: Oral care BID (2x/day)      Harlene LITTIE Ned 01/04/2024,12:37 PM

## 2024-01-04 NOTE — Telephone Encounter (Signed)
 Appointment details confirmed

## 2024-01-05 ENCOUNTER — Telehealth (HOSPITAL_COMMUNITY): Payer: Self-pay | Admitting: Nurse Practitioner

## 2024-01-05 NOTE — Telephone Encounter (Signed)
 OP MBS was completed on 01/04/24 at [facility]. ([facility])

## 2024-01-05 NOTE — Telephone Encounter (Signed)
 Sent return fax to [facility]) seeking clarity on order for on 12/05/23, no correspondence from that office. Called facility at [phone] spoke with Barnie regarding order clarification. Deborah sent request as urgent to ordering provider. ([facility])

## 2024-01-08 ENCOUNTER — Other Ambulatory Visit

## 2024-01-18 ENCOUNTER — Ambulatory Visit (INDEPENDENT_AMBULATORY_CARE_PROVIDER_SITE_OTHER): Payer: Medicare HMO

## 2024-01-18 DIAGNOSIS — I639 Cerebral infarction, unspecified: Secondary | ICD-10-CM | POA: Diagnosis not present

## 2024-01-19 LAB — CUP PACEART REMOTE DEVICE CHECK
Date Time Interrogation Session: 20250807121159
Implantable Pulse Generator Implant Date: 20220706
Pulse Gen Model: 436066
Pulse Gen Serial Number: 94069553

## 2024-01-21 ENCOUNTER — Ambulatory Visit: Payer: Self-pay | Admitting: Cardiology

## 2024-01-23 ENCOUNTER — Encounter: Admitting: Speech Pathology

## 2024-01-24 ENCOUNTER — Ambulatory Visit: Attending: Nurse Practitioner | Admitting: Speech Pathology

## 2024-01-26 ENCOUNTER — Ambulatory Visit

## 2024-01-26 ENCOUNTER — Ambulatory Visit (INDEPENDENT_AMBULATORY_CARE_PROVIDER_SITE_OTHER): Admitting: Podiatry

## 2024-01-26 DIAGNOSIS — M79674 Pain in right toe(s): Secondary | ICD-10-CM

## 2024-01-26 DIAGNOSIS — M79675 Pain in left toe(s): Secondary | ICD-10-CM | POA: Diagnosis not present

## 2024-01-26 DIAGNOSIS — B351 Tinea unguium: Secondary | ICD-10-CM

## 2024-01-26 NOTE — Progress Notes (Signed)
 This model shoe is running big cannot send back now has been over 6 months I explained to patient she needs to let us  know sooner if things are not fitting properly  I will this time order a replacement for her / her shoe is not avail I am ordering her 2 other shoes to try / inserts are here in cupboard Prior shoes are in great shape / barely worn. Will keep for stock

## 2024-01-26 NOTE — Progress Notes (Unsigned)
  Subjective:  Patient ID: Erica [name], female    DOB: 1955-12-13,  MRN: [id number]  Chief Complaint  Patient presents with   Nail Problem   68 y.o. female returns for the above complaint.  Patient presents with thickened elongated dystrophic mycotic toenails x 10 mild pain on palpation with ambulation and shoe pressure she would like for me debride she is not able to do her self denies any other acute complaints.  Objective:  There were no vitals filed for this visit. Podiatric Exam: Vascular: dorsalis pedis and posterior tibial pulses are palpable bilateral. Capillary return is immediate. Temperature gradient is WNL. Skin turgor WNL  Sensorium: Normal Semmes Weinstein monofilament test. Normal tactile sensation bilaterally. Nail Exam: Pt has thick disfigured discolored nails with subungual debris noted bilateral entire nail hallux through fifth toenails.  Pain on palpation to the nails. Ulcer Exam: There is no evidence of ulcer or pre-ulcerative changes or infection. Orthopedic Exam: Muscle tone and strength are WNL. No limitations in general ROM. No crepitus or effusions noted.  Skin: No Porokeratosis. No infection or ulcers    Assessment & Plan:   1. Pain due to onychomycosis of toenails of both feet     Patient was evaluated and treated and all questions answered.  Onychomycosis with pain  -Nails palliatively debrided as below. -Educated on self-care  Procedure: Nail Debridement Rationale: pain  Type of Debridement: manual, sharp debridement. Instrumentation: Nail nipper, rotary burr. Number of Nails: 10  Procedures and Treatment: Consent by patient was obtained for treatment procedures. The patient understood the discussion of treatment and procedures well. All questions were answered thoroughly reviewed. Debridement of mycotic and hypertrophic toenails, 1 through 5 bilateral and clearing of subungual debris. No ulceration, no infection noted.  Return Visit-Office  Procedure: Patient instructed to return to the office for a follow up visit 3 months for continued evaluation and treatment.  Franky Blanch, DPM    No follow-ups on file.

## 2024-02-19 ENCOUNTER — Ambulatory Visit (INDEPENDENT_AMBULATORY_CARE_PROVIDER_SITE_OTHER): Payer: Medicare HMO

## 2024-02-19 DIAGNOSIS — I639 Cerebral infarction, unspecified: Secondary | ICD-10-CM | POA: Diagnosis not present

## 2024-02-20 LAB — CUP PACEART REMOTE DEVICE CHECK
Date Time Interrogation Session: 20250908111224
Implantable Pulse Generator Implant Date: 20220706
Pulse Gen Model: 436066
Pulse Gen Serial Number: 94069553

## 2024-02-22 ENCOUNTER — Encounter (HOSPITAL_COMMUNITY): Payer: Self-pay | Admitting: Emergency Medicine

## 2024-02-22 ENCOUNTER — Other Ambulatory Visit: Payer: Self-pay

## 2024-02-22 ENCOUNTER — Emergency Department (HOSPITAL_COMMUNITY)

## 2024-02-22 ENCOUNTER — Observation Stay (HOSPITAL_COMMUNITY)
Admission: EM | Admit: 2024-02-22 | Discharge: 2024-02-25 | Disposition: A | Discharge status: Home / Self Care | Attending: Infectious Diseases | Admitting: Infectious Diseases

## 2024-02-22 DIAGNOSIS — J449 Chronic obstructive pulmonary disease, unspecified: Secondary | ICD-10-CM | POA: Diagnosis not present

## 2024-02-22 DIAGNOSIS — R0602 Shortness of breath: Secondary | ICD-10-CM | POA: Diagnosis present

## 2024-02-22 DIAGNOSIS — E1165 Type 2 diabetes mellitus with hyperglycemia: Secondary | ICD-10-CM | POA: Insufficient documentation

## 2024-02-22 DIAGNOSIS — Z794 Long term (current) use of insulin: Secondary | ICD-10-CM | POA: Diagnosis not present

## 2024-02-22 DIAGNOSIS — R4182 Altered mental status, unspecified: Secondary | ICD-10-CM | POA: Diagnosis present

## 2024-02-22 DIAGNOSIS — D7389 Other diseases of spleen: Secondary | ICD-10-CM | POA: Insufficient documentation

## 2024-02-22 DIAGNOSIS — Z85048 Personal history of other malignant neoplasm of rectum, rectosigmoid junction, and anus: Secondary | ICD-10-CM | POA: Diagnosis not present

## 2024-02-22 DIAGNOSIS — K8080 Other cholelithiasis without obstruction: Secondary | ICD-10-CM | POA: Insufficient documentation

## 2024-02-22 DIAGNOSIS — E1142 Type 2 diabetes mellitus with diabetic polyneuropathy: Secondary | ICD-10-CM | POA: Diagnosis not present

## 2024-02-22 DIAGNOSIS — I6930 Unspecified sequelae of cerebral infarction: Secondary | ICD-10-CM

## 2024-02-22 DIAGNOSIS — I129 Hypertensive chronic kidney disease with stage 1 through stage 4 chronic kidney disease, or unspecified chronic kidney disease: Secondary | ICD-10-CM | POA: Diagnosis not present

## 2024-02-22 DIAGNOSIS — I69323 Fluency disorder following cerebral infarction: Secondary | ICD-10-CM | POA: Diagnosis not present

## 2024-02-22 DIAGNOSIS — F332 Major depressive disorder, recurrent severe without psychotic features: Secondary | ICD-10-CM | POA: Insufficient documentation

## 2024-02-22 DIAGNOSIS — E1122 Type 2 diabetes mellitus with diabetic chronic kidney disease: Secondary | ICD-10-CM | POA: Diagnosis not present

## 2024-02-22 DIAGNOSIS — K219 Gastro-esophageal reflux disease without esophagitis: Secondary | ICD-10-CM | POA: Diagnosis not present

## 2024-02-22 DIAGNOSIS — N1832 Chronic kidney disease, stage 3b: Secondary | ICD-10-CM | POA: Insufficient documentation

## 2024-02-22 DIAGNOSIS — Z87891 Personal history of nicotine dependence: Secondary | ICD-10-CM | POA: Diagnosis not present

## 2024-02-22 DIAGNOSIS — N179 Acute kidney failure, unspecified: Secondary | ICD-10-CM | POA: Insufficient documentation

## 2024-02-22 DIAGNOSIS — Z79899 Other long term (current) drug therapy: Secondary | ICD-10-CM | POA: Insufficient documentation

## 2024-02-22 DIAGNOSIS — K8020 Calculus of gallbladder without cholecystitis without obstruction: Secondary | ICD-10-CM | POA: Diagnosis not present

## 2024-02-22 DIAGNOSIS — Z7902 Long term (current) use of antithrombotics/antiplatelets: Secondary | ICD-10-CM | POA: Diagnosis not present

## 2024-02-22 DIAGNOSIS — I6381 Other cerebral infarction due to occlusion or stenosis of small artery: Secondary | ICD-10-CM | POA: Insufficient documentation

## 2024-02-22 DIAGNOSIS — J849 Interstitial pulmonary disease, unspecified: Secondary | ICD-10-CM | POA: Insufficient documentation

## 2024-02-22 DIAGNOSIS — I6389 Other cerebral infarction: Secondary | ICD-10-CM | POA: Insufficient documentation

## 2024-02-22 DIAGNOSIS — I639 Cerebral infarction, unspecified: Secondary | ICD-10-CM | POA: Diagnosis present

## 2024-02-22 DIAGNOSIS — J4489 Other specified chronic obstructive pulmonary disease: Secondary | ICD-10-CM | POA: Insufficient documentation

## 2024-02-22 DIAGNOSIS — E11649 Type 2 diabetes mellitus with hypoglycemia without coma: Secondary | ICD-10-CM | POA: Diagnosis not present

## 2024-02-22 LAB — I-STAT CHEM 8, ED
BUN: 44 mg/dL — ABNORMAL HIGH (ref 8–23)
Calcium, Ion: 1.18 mmol/L (ref 1.15–1.40)
Chloride: 115 mmol/L — ABNORMAL HIGH (ref 98–111)
Creatinine, Ser: 2.2 mg/dL — ABNORMAL HIGH (ref 0.44–1.00)
Glucose, Bld: 185 mg/dL — ABNORMAL HIGH (ref 70–99)
HCT: 39 % (ref 36.0–46.0)
Hemoglobin: 13.3 g/dL (ref 12.0–15.0)
Potassium: 5.1 mmol/L (ref 3.5–5.1)
Sodium: 139 mmol/L (ref 135–145)
TCO2: 16 mmol/L — ABNORMAL LOW (ref 22–32)

## 2024-02-22 LAB — DIFFERENTIAL
Abs Immature Granulocytes: 0.04 K/uL (ref 0.00–0.07)
Basophils Absolute: 0 K/uL (ref 0.0–0.1)
Basophils Relative: 0 %
Eosinophils Absolute: 0 K/uL (ref 0.0–0.5)
Eosinophils Relative: 1 %
Immature Granulocytes: 1 %
Lymphocytes Relative: 20 %
Lymphs Abs: 1.4 K/uL (ref 0.7–4.0)
Monocytes Absolute: 0.5 K/uL (ref 0.1–1.0)
Monocytes Relative: 8 %
Neutro Abs: 5 K/uL (ref 1.7–7.7)
Neutrophils Relative %: 70 %

## 2024-02-22 LAB — COMPREHENSIVE METABOLIC PANEL WITH GFR
ALT: 15 U/L (ref 0–44)
AST: 19 U/L (ref 15–41)
Albumin: 3.7 g/dL (ref 3.5–5.0)
Alkaline Phosphatase: 92 U/L (ref 38–126)
Anion gap: 9 (ref 5–15)
BUN: 47 mg/dL — ABNORMAL HIGH (ref 8–23)
CO2: 16 mmol/L — ABNORMAL LOW (ref 22–32)
Calcium: 8.9 mg/dL (ref 8.9–10.3)
Chloride: 111 mmol/L (ref 98–111)
Creatinine, Ser: 2.21 mg/dL — ABNORMAL HIGH (ref 0.44–1.00)
GFR, Estimated: 24 mL/min — ABNORMAL LOW (ref >60)
Glucose, Bld: 189 mg/dL — ABNORMAL HIGH (ref 70–99)
Potassium: 4.9 mmol/L (ref 3.5–5.1)
Sodium: 136 mmol/L (ref 135–145)
Total Bilirubin: 1.1 mg/dL (ref 0.0–1.2)
Total Protein: 7.2 g/dL (ref 6.5–8.1)

## 2024-02-22 LAB — CBC
HCT: 38.6 % (ref 36.0–46.0)
Hemoglobin: 13.8 g/dL (ref 12.0–15.0)
MCH: 32.2 pg (ref 26.0–34.0)
MCHC: 35.8 g/dL (ref 30.0–36.0)
MCV: 90 fL (ref 80.0–100.0)
Platelets: 166 K/uL (ref 150–400)
RBC: 4.29 MIL/uL (ref 3.87–5.11)
RDW: 16.3 % — ABNORMAL HIGH (ref 11.5–15.5)
WBC: 7 K/uL (ref 4.0–10.5)
nRBC: 0 % (ref 0.0–0.2)

## 2024-02-22 MED ORDER — SODIUM CHLORIDE 0.9 % IV BOLUS
1000.0000 mL | Freq: Once | INTRAVENOUS | Status: AC
  Administered 2024-02-23: 1000 mL via INTRAVENOUS

## 2024-02-22 MED ORDER — SODIUM CHLORIDE 0.9% FLUSH
3.0000 mL | Freq: Once | INTRAVENOUS | Status: AC
  Administered 2024-02-23: 3 mL via INTRAVENOUS

## 2024-02-22 NOTE — ED Provider Triage Note (Signed)
 Emergency Medicine Provider Triage Evaluation Note  Erica [name] , a 68 y.o. female  was evaluated in triage.  Pt complains of being brought in by EMS due to worsening stuttering starting around 9:00 when she was on the phone with her son.  Patient states she is not exactly sure what happened but something came over her and now she says she feels a little bit better but is still having some stuttering.  She feels weak all over and nursing report that she became hypotensive briefly and got diaphoretic but improved with lying down.  Patient denies any pain, shortness of breath, nausea or vomiting.  Also complaining of some neck pain.  Review of Systems  Positive: Stuttering, generalized weakness Negative: Chest pain, shortness of breath, fever, vomiting  Physical Exam  BP 137/81   Pulse 78   Temp 98 F (36.7 C)   Resp 16   Wt 73 kg   SpO2 99%   BMI 28.51 kg/m  Gen:   Awake, no distress   Resp:  Normal effort  MSK:   No swelling noted the extremities Other:  Patient is mildly diaphoretic.  When asked to lift her lower extremities she can barely lift her heel off the bed bilaterally.  She is able to hold up both arms and has no significant significant pronator drift.  No facial droop.  She does have some mild pain in the left side of her neck.  Medical Decision Making  Medically screening exam initiated at 11:21 PM.  Appropriate orders placed.  Erica [name] was informed that the remainder of the evaluation will be completed by another provider, this initial triage assessment does not replace that evaluation, and the importance of remaining in the ED until their evaluation is complete.     Doretha Folks, MD 02/22/24 [id number]

## 2024-02-22 NOTE — ED Notes (Signed)
 Patient transported to CT

## 2024-02-22 NOTE — ED Provider Notes (Signed)
 [place] EMERGENCY DEPARTMENT AT [facility] Provider Note   CSN: [id number] Arrival date & time: 02/22/24  2211   Patient presents with: Anxiety and Stuttering  Erica [name] is a 68 y.o. female with history of multiple CVAs, CKD stage III, COPD, GERD, remote history of rectal cancer.,  Remote history of smoking who presented to the emergency room after her son called EMS today.  Patient has residual left-sided deficits and intermittent stuttering following her CVAs in the past.  States she was talking on the phone with her son this evening when he became concerned that she was stuttering more than normal and called EMS.  Patient reports that she has been short of breath for last few days.  Of note patient is notably delayed in her cognition at time my interview.  She is accompanied by her sister and niece at the bedside to state that her cognition is delayed from baseline but she is not exhibiting any new slurring of her speech.  Patient denies any worsening of her weakness from baseline.  Denies cough, fevers, chills, or known ill contacts.  Denies urinary symptoms.  L5 caveat due to patient's altered mental status.   HPI     Prior to Admission medications   Medication Sig Start Date End Date Taking? Authorizing Provider  acetaminophen  (TYLENOL ) 325 MG tablet Take 2 tablets (650 mg total) by mouth every 4 (four) hours as needed for mild pain (pain score 1-3) (or temp > 37.5 C (99.5 F)). 09/28/23   Elgergawy, Brayton RAMAN, MD  albuterol  (VENTOLIN  HFA) 108 (90 Base) MCG/ACT inhaler Inhale 2 puffs into the lungs every 4 (four) hours as needed for wheezing. 10/09/23   Angiulli, Toribio PARAS, PA-C  amLODipine  (NORVASC ) 10 MG tablet Take 1 tablet (10 mg total) by mouth daily. 10/09/23   Angiulli, Toribio PARAS, PA-C  cetirizine  (ZYRTEC ) 10 MG tablet Take 1 tablet (10 mg total) by mouth daily. 06/09/23 06/01/24  Waddell Sluder, PA-C  clopidogrel  (PLAVIX ) 75 MG tablet Take 1 tablet (75 mg total) by mouth  daily. 10/09/23   Angiulli, Toribio PARAS, PA-C  dapagliflozin  propanediol (FARXIGA ) 10 MG TABS tablet Take 1 tablet (10 mg total) by mouth daily. 10/09/23   Angiulli, Toribio PARAS, PA-C  diclofenac  Sodium (VOLTAREN ) 1 % GEL Apply 1 Application topically 2 (two) times daily as needed (pain). 10/09/23   Angiulli, Toribio PARAS, PA-C  ezetimibe  (ZETIA ) 10 MG tablet Take 1 tablet (10 mg total) by mouth daily. 10/09/23   Angiulli, Toribio PARAS, PA-C  gabapentin  (NEURONTIN ) 100 MG capsule Take 1 capsule (100 mg total) by mouth 3 (three) times daily. 10/09/23   Angiulli, Toribio PARAS, PA-C  hydrALAZINE  (APRESOLINE ) 25 MG tablet Take 1 tablet (25 mg total) by mouth 3 (three) times daily. 10/09/23   Angiulli, Toribio PARAS, PA-C  insulin  glargine (LANTUS ) 100 UNIT/ML Solostar Pen Inject 10 Units into the skin daily. Pen expires 28 days after first use 10/09/23   Angiulli, Daniel J, PA-C  Insulin  Pen Needle 32G X 4 MM MISC Use with Lantus  pen 10/10/23   Angiulli, Toribio PARAS, PA-C  melatonin 3 MG TABS tablet Take 1 tablet (3 mg total) by mouth at bedtime. 10/09/23   Angiulli, Toribio PARAS, PA-C  naphazoline-glycerin  (CLEAR EYES REDNESS) 0.012-0.25 % SOLN Place 1-2 drops into both eyes 3 (three) times daily with meals. Patient taking differently: Place 2 drops into both eyes daily as needed for eye irritation. 03/14/23   Love, Sharlet RAMAN, PA-C  pantoprazole  (PROTONIX )  40 MG tablet Take 1 tablet (40 mg total) by mouth daily. 10/09/23   Angiulli, Toribio PARAS, PA-C  Pirfenidone  801 MG TABS TAKE 1 TABLET (801 MG) BY MOUTH WITH BREAKFAST, WITH LUNCH, AND WITH EVENING MEAL. Patient taking differently: Take 1 tablet by mouth in the morning and at bedtime. 08/22/22   Geronimo Amel, MD  rosuvastatin  (CRESTOR ) 40 MG tablet Take 1 tablet (40 mg total) by mouth daily. 10/09/23 06/01/24  Angiulli, Toribio PARAS, PA-C  sertraline  (ZOLOFT ) 50 MG tablet Take 1 tablet (50 mg total) by mouth daily. 10/09/23   Angiulli, Toribio PARAS, PA-C  SYMBICORT  160-4.5 MCG/ACT inhaler Inhale 2  puffs into the lungs 2 (two) times daily. 10/09/23   Angiulli, Toribio PARAS, PA-C  topiramate  (TOPAMAX ) 25 MG tablet Take 1 tablet (25 mg total) by mouth 2 (two) times daily. 12/06/23   Whitfield Raisin, NP  venlafaxine  XR (EFFEXOR -XR) 150 MG 24 hr capsule Take 1 capsule (150 mg total) by mouth daily. 10/09/23   Angiulli, Toribio PARAS, PA-C    Allergies: Penicillins    Review of Systems  Unable to perform ROS: Mental status change  Neurological:  Positive for speech difficulty.  Psychiatric/Behavioral:  Positive for confusion.     Updated Vital Signs BP (!) 154/89   Pulse 69   Temp 98 F (36.7 C) (Oral)   Resp (!) 22   Wt 73 kg   SpO2 100%   BMI 28.51 kg/m   Physical Exam Vitals and nursing note reviewed.  Constitutional:      General: She is not in acute distress.    Appearance: She is not ill-appearing or toxic-appearing.  HENT:     Head: Normocephalic and atraumatic.     Mouth/Throat:     Mouth: Mucous membranes are moist.     Pharynx: No oropharyngeal exudate or posterior oropharyngeal erythema.  Eyes:     General:        Right eye: No discharge.        Left eye: No discharge.     Extraocular Movements: Extraocular movements intact.     Conjunctiva/sclera: Conjunctivae normal.     Pupils: Pupils are equal, round, and reactive to light.  Cardiovascular:     Rate and Rhythm: Normal rate and regular rhythm.     Pulses: Normal pulses.  Pulmonary:     Effort: Pulmonary effort is normal. No respiratory distress.     Breath sounds: Normal breath sounds. No wheezing or rales.  Abdominal:     General: Bowel sounds are normal. There is no distension.     Palpations: Abdomen is soft.     Tenderness: There is no abdominal tenderness. There is no right CVA tenderness, left CVA tenderness, guarding or rebound.  Musculoskeletal:        General: No deformity.     Cervical back: Neck supple.     Right lower leg: No edema.     Left lower leg: No edema.  Skin:    General: Skin is warm  and dry.     Capillary Refill: Capillary refill takes less than 2 seconds.  Neurological:     General: No focal deficit present.     Mental Status: She is alert and oriented to person, place, and time.     GCS: GCS eye subscore is 4. GCS verbal subscore is 4. GCS motor subscore is 6.     Cranial Nerves: Cranial nerves 2-12 are intact.     Sensory: Sensory deficit present.  Motor: Weakness present.     Comments: Baseline sensory deficit in the left face, generalized symmetric weakness.  Exam limited by patient's confusion and generalized weakness.  Delayed cognition.   Psychiatric:        Mood and Affect: Mood normal.     (all labs ordered are listed, but only abnormal results are displayed) Labs Reviewed  CBC - Abnormal; Notable for the following components:      Result Value   RDW 16.3 (*)    All other components within normal limits  COMPREHENSIVE METABOLIC PANEL WITH GFR - Abnormal; Notable for the following components:   CO2 16 (*)    Glucose, Bld 189 (*)    BUN 47 (*)    Creatinine, Ser 2.21 (*)    GFR, Estimated 24 (*)    All other components within normal limits  URINALYSIS, W/ REFLEX TO CULTURE (INFECTION SUSPECTED) - Abnormal; Notable for the following components:   Color, Urine STRAW (*)    Glucose, UA >=500 (*)    Protein, ur 30 (*)    Bacteria, UA RARE (*)    All other components within normal limits  I-STAT CHEM 8, ED - Abnormal; Notable for the following components:   Chloride 115 (*)    BUN 44 (*)    Creatinine, Ser 2.20 (*)    Glucose, Bld 185 (*)    TCO2 16 (*)    All other components within normal limits  CBG MONITORING, ED - Abnormal; Notable for the following components:   Glucose-Capillary 174 (*)    All other components within normal limits  RESP PANEL BY RT-PCR (RSV, FLU A&B, COVID)  RVPGX2  DIFFERENTIAL  ETHANOL  PROTIME-INR  APTT  I-STAT VENOUS BLOOD GAS, ED  TROPONIN I (HIGH SENSITIVITY)  TROPONIN I (HIGH SENSITIVITY)     EKG: None  Radiology: CT CHEST ABDOMEN PELVIS WO CONTRAST Result Date: 02/23/2024 EXAM: CT CHEST, ABDOMEN AND PELVIS WITHOUT CONTRAST 02/23/2024 04:26:54 AM TECHNIQUE: CT of the chest, abdomen and pelvis was performed without the administration of intravenous contrast. Multiplanar reformatted images are provided for review. Automated exposure control, iterative reconstruction, and/or weight based adjustment of the mA/kV was utilized to reduce the radiation dose to as low as reasonably achievable. COMPARISON: CT chest from 03/07/2022 and CT abdomen and pelvis from 08/17/2016. CLINICAL HISTORY: Respiratory distress. FINDINGS: CHEST: MEDIASTINUM AND LYMPH NODES: Heart size is at the upper limits of normal. Coronary artery calcifications are present. Loop recorder identified within the left ventral chest wall. No mediastinal, hilar or axillary lymphadenopathy. LUNGS AND PLEURA: Signs of fibrotic interstitial lung disease with bilateral peripheral predominant interstitial reticulation, traction bronchiectasis and subpleural honeycombing. 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 No suspicious lung nodules. No pleural fluid, interstitial edema or pneumothorax. ABDOMEN AND PELVIS: LIVER: The liver is unremarkable. GALLBLADDER AND BILE DUCTS: Multiple gallstones are identified which measure up to 5 mm, image 54/3. No CT findings of gallbladder wall thickening or pericholecystic inflammation. No bile duct dilatation. SPLEEN: Peripheral low attenuation structure within the spleen measures 1.3 cm, image 46/3. Not seen on previous exams. PANCREAS: No pancreatic inflammation, main duct dilatation or mass. ADRENAL GLANDS: Normal appearance of the adrenal glands. KIDNEYS, URETERS AND BLADDER: No stones in the kidneys or ureters. No hydronephrosis. No perinephric or periureteral stranding. Urinary bladder is unremarkable. GI AND BOWEL: The appendix is visualized and appears normal.  Colonic diverticula identified without signs of acute diverticulitis. No abnormal bowel dilatation or inflammation. Signs of previous  distal colonic resection with reanastomosis at the level of the rectum, image 102/3. REPRODUCTIVE ORGANS: The uterus appears normal. No adnexal mass. PERITONEUM AND RETROPERITONEUM: No free fluid or fluid collections within the abdomen or pelvis. No free air. VASCULATURE: Mild aortic atherosclerotic calcification. ABDOMINAL AND PELVIS LYMPH NODES: No lymphadenopathy. BONES AND SOFT TISSUES: L5-S1 degenerative disc disease. No acute osseous abnormality. No focal soft tissue abnormality. IMPRESSION: 1. 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 2. Multiple gallstones, the largest measuring 5 mm, without CT findings of gallbladder wall thickening or pericholecystic inflammation. 3. Peripheral low attenuation structure within the spleen measuring 1.3 cm, not seen on previous exams. This is an indeterminate finding with differential considerations including benign and malignant splenic lesions as well as sequelae of the splenic infarct. Solitary splenic lesions are typically benign. However, in a patient with a known malignancy, consider follow-up imaging with non-emergent contrast-enhanced MRI of the abdomen. Electronically signed by: Waddell Calk MD 02/23/2024 06:10 AM EDT RP Workstation: [id number]   DG Chest Portable 1 View Result Date: 02/23/2024 EXAM: 1 VIEW XRAY OF THE CHEST 02/23/2024 12:33:47 AM COMPARISON: 03/18/2023 CLINICAL HISTORY: SOB. FINDINGS: LUNGS AND PLEURA: Diffuse interstitial coarsening bilaterally greater on the left. No focal pulmonary opacity. No pulmonary edema. No pleural effusion. No pneumothorax. HEART AND MEDIASTINUM: Stable cardiomediastinal silhouette. Loop recorder. No  acute abnormality of the cardiac and mediastinal silhouettes. BONES AND SOFT TISSUES: No acute osseous abnormality. IMPRESSION: 1. Diffuse interstitial coarsening bilaterally, greater on the left. This is compatible with interstitial lung disease. Superimposed infection or edema is difficult to exclude. Electronically signed by: Norman Gatlin MD 02/23/2024 12:40 AM EDT RP Workstation: [id number]   CT HEAD WO CONTRAST Result Date: 02/22/2024 EXAM: CT HEAD WITHOUT CONTRAST 02/22/2024 11:35:20 PM TECHNIQUE: CT of the head was performed without the administration of intravenous contrast. Automated exposure control, iterative reconstruction, and/or weight based adjustment of the mA/kV was utilized to reduce the radiation dose to as low as reasonably achievable. COMPARISON: CT and MRI 09/25/2023 CLINICAL HISTORY: Neuro deficit, acute, stroke suspected. Stroke suspected; Hx of several strokes. FINDINGS: BRAIN AND VENTRICLES: No acute hemorrhage. No evidence of acute infarct. No hydrocephalus. No extra-axial collection. No mass effect or midline shift. Chronic microvascular ischemic changes and generalized atrophy. Small chronic infarcts in the bilateral cerebellar hemispheres. Chronic lacunar infarcts in the thalamus and caudate bilaterally. ORBITS: No acute abnormality. SINUSES: Chronic sphenoid sinusitis. SOFT TISSUES AND SKULL: No acute soft tissue abnormality. No skull fracture. IMPRESSION: 1. No acute intracranial abnormality. 2. Similar microvascular ischemic change and chronic infarcts. Electronically signed by: Norman Gatlin MD 02/22/2024 11:43 PM EDT RP Workstation: [id number]     Procedures   Medications Ordered in the ED  sodium chloride  flush (NS) 0.9 % injection 3 mL (has no administration in time range)  ipratropium-albuterol  (DUONEB) 0.5-2.5 (3) MG/3ML nebulizer solution 3 mL (has no administration in time range)  sodium chloride  0.9 % bolus 1,000 mL (0 mLs Intravenous Stopped 02/23/24 0515)     Clinical Course as of 02/23/24 0657  Fri Feb 23, 2024  0632 Multiple CVAs -- on ASA, plavix , baseline left sided deficits and intermittent stuttering. Sister at bedside reports seems fairly stable compared to baseline, but confused over the last few days. Slow to comprehend on exam. Some new shob, ILD on CT, and low bicarb. Normal CT head, ILD on CT chest. Reassess, possible admit for AMS vs close  follow up. [CP]    Clinical Course User Index [CP] Rosan Sherlean DEL, PA-C                                 Medical Decision Making 68 y/o female who presents with concern for stuttering speech, confusion.   VS reassuring on intake, Patient confused. Cardiopulmonary and abdominal exams are unremarkable. Neuro exam appears to be at patient's baseline.   The differential diagnosis for AMS is extensive and includes, but is not limited to:  Drug overdose - opioids, alcohol , sedatives, antipsychotics, drug withdrawal, others Metabolic: hypoxia, hypoglycemia, hyperglycemia, hypercalcemia, hypernatremia, hyponatremia, uremia, hepatic encephalopathy, hypothyroidism, hyperthyroidism, vitamin B12 or thiamine  deficiency, carbon monoxide poisoning, Wilson's disease, Lactic acidosis, DKA/HHOS Infectious: meningitis, encephalitis, bacteremia/sepsis, urinary tract infection, pneumonia, neurosyphilis Structural: Space-occupying lesion, (brain tumor, subdural hematoma, hydrocephalus,) Vascular: stroke, subarachnoid hemorrhage, coronary ischemia, hypertensive encephalopathy, CNS vasculitis, thrombotic thrombocytopenic purpura, disseminated intravascular coagulation, hyperviscosity Psychiatric: Schizophrenia, depression; Other: Seizure, hypothermia, heat stroke, ICU psychosis, dementia -sundowning.   Amount and/or Complexity of Data Reviewed Labs: ordered.    Details: CBC unremarkable, CMP with baseline renal function, troponins are negative x 2, UA without evidence of infection, RVP is  negative. Radiology: ordered.    Details: Chest x-ray with interstitial lung disease, question superimposed infection.  CT chest abdomen pelvis obtained Contrast evident poor baseline renal function with interstitial lung disease without definitive acute superimposed infection.  New splenic abnormality recommend nonemergent contrast MRI.  Risk Prescription drug management.   Patient pending CABG, nebulizer treatments and reevaluation attempt to change.Care of this patient signed out to oncoming ED provider C. Prosperi, PA-C at time of shift change. All pertinent HPI, physical exam, and laboratory findings were discussed with them prior to my departure. Disposition of patient pending completion of workup, reevaluation, and clinical judgement of oncoming ED provider.   This chart was dictated using voice recognition software, Dragon. Despite the best efforts of this provider to proofread and correct errors, errors may still occur which can change documentation meaning.     Final diagnoses:  None    ED Discharge Orders     None          Bobette Pleasant JONELLE DEVONNA 02/23/24 [id number]    Lorette Mayo, MD 03/01/24 0200

## 2024-02-22 NOTE — ED Notes (Signed)
 Pt suddenly called out of triage room, I don't feel good and got very diaphoretic and hypotensive. Pt then moved to stretcher chair and laid back in trendelenburg, bp improved. Awaiting room placement

## 2024-02-22 NOTE — ED Triage Notes (Addendum)
 Pt in from home via [facility] with reported anxiety. Initial callout was for concern of stroke - son called 911, states they were talking on the phone and she was stuttering, hx TIA's and CVA w/L side deficits and dementia. Per EMS, son and pt don't have best relationship and it seems phonecall caused some stress. Stroke scale negative per EMS. Pt alert to self and place on ED arrival.  2223: this RN called Gordy (son [phone]) and he states they were talking on the phone about 9pm and he noticed she was stuttering frequently, but he says this is common after her last stroke. LSN at 9pm during the phonecall with son and when he was able to see her on the camera at her house.  VS w/EMS: 168/100 50HR 22RR 99%RA CBG 197

## 2024-02-22 NOTE — ED Notes (Signed)
 Pt back from CT

## 2024-02-23 ENCOUNTER — Emergency Department (HOSPITAL_COMMUNITY)

## 2024-02-23 ENCOUNTER — Observation Stay (HOSPITAL_BASED_OUTPATIENT_CLINIC_OR_DEPARTMENT_OTHER)

## 2024-02-23 ENCOUNTER — Observation Stay (HOSPITAL_COMMUNITY)

## 2024-02-23 DIAGNOSIS — R4182 Altered mental status, unspecified: Secondary | ICD-10-CM | POA: Diagnosis present

## 2024-02-23 DIAGNOSIS — I639 Cerebral infarction, unspecified: Secondary | ICD-10-CM

## 2024-02-23 DIAGNOSIS — J449 Chronic obstructive pulmonary disease, unspecified: Secondary | ICD-10-CM | POA: Diagnosis not present

## 2024-02-23 DIAGNOSIS — I6350 Cerebral infarction due to unspecified occlusion or stenosis of unspecified cerebral artery: Secondary | ICD-10-CM

## 2024-02-23 DIAGNOSIS — E1165 Type 2 diabetes mellitus with hyperglycemia: Secondary | ICD-10-CM

## 2024-02-23 DIAGNOSIS — I6389 Other cerebral infarction: Secondary | ICD-10-CM

## 2024-02-23 DIAGNOSIS — G9341 Metabolic encephalopathy: Secondary | ICD-10-CM

## 2024-02-23 DIAGNOSIS — R569 Unspecified convulsions: Secondary | ICD-10-CM | POA: Diagnosis not present

## 2024-02-23 DIAGNOSIS — D7389 Other diseases of spleen: Secondary | ICD-10-CM

## 2024-02-23 DIAGNOSIS — J849 Interstitial pulmonary disease, unspecified: Secondary | ICD-10-CM | POA: Diagnosis not present

## 2024-02-23 DIAGNOSIS — Z79899 Other long term (current) drug therapy: Secondary | ICD-10-CM

## 2024-02-23 DIAGNOSIS — Z7982 Long term (current) use of aspirin: Secondary | ICD-10-CM

## 2024-02-23 DIAGNOSIS — Z7952 Long term (current) use of systemic steroids: Secondary | ICD-10-CM

## 2024-02-23 DIAGNOSIS — Z794 Long term (current) use of insulin: Secondary | ICD-10-CM

## 2024-02-23 LAB — PROTIME-INR
INR: 1.2 (ref 0.8–1.2)
Prothrombin Time: 15.6 s — ABNORMAL HIGH (ref 11.4–15.2)

## 2024-02-23 LAB — GLUCOSE, CAPILLARY
Glucose-Capillary: 163 mg/dL — ABNORMAL HIGH (ref 70–99)
Glucose-Capillary: 192 mg/dL — ABNORMAL HIGH (ref 70–99)

## 2024-02-23 LAB — I-STAT VENOUS BLOOD GAS, ED
Acid-base deficit: 10 mmol/L — ABNORMAL HIGH (ref 0.0–2.0)
Bicarbonate: 14.5 mmol/L — ABNORMAL LOW (ref 20.0–28.0)
Calcium, Ion: 1.05 mmol/L — ABNORMAL LOW (ref 1.15–1.40)
HCT: 33 % — ABNORMAL LOW (ref 36.0–46.0)
Hemoglobin: 11.2 g/dL — ABNORMAL LOW (ref 12.0–15.0)
O2 Saturation: 81 %
Potassium: 4.8 mmol/L (ref 3.5–5.1)
Sodium: 140 mmol/L (ref 135–145)
TCO2: 15 mmol/L — ABNORMAL LOW (ref 22–32)
pCO2, Ven: 26.2 mmHg — ABNORMAL LOW (ref 44–60)
pH, Ven: 7.35 (ref 7.25–7.43)
pO2, Ven: 46 mmHg — ABNORMAL HIGH (ref 32–45)

## 2024-02-23 LAB — URINALYSIS, W/ REFLEX TO CULTURE (INFECTION SUSPECTED)
Bilirubin Urine: NEGATIVE
Glucose, UA: >=500 mg/dL — AB
Hgb urine dipstick: NEGATIVE
Ketones, ur: NEGATIVE mg/dL
Leukocytes,Ua: NEGATIVE
Nitrite: NEGATIVE
Protein, ur: 30 mg/dL — AB
Specific Gravity, Urine: 1.01 (ref 1.005–1.030)
pH: 5 (ref 5.0–8.0)

## 2024-02-23 LAB — CBG MONITORING, ED
Glucose-Capillary: 174 mg/dL — ABNORMAL HIGH (ref 70–99)
Glucose-Capillary: 188 mg/dL — ABNORMAL HIGH (ref 70–99)

## 2024-02-23 LAB — TROPONIN I (HIGH SENSITIVITY)
Troponin I (High Sensitivity): 9 ng/L (ref <18)
Troponin I (High Sensitivity): 9 ng/L (ref <18)

## 2024-02-23 LAB — RESP PANEL BY RT-PCR (RSV, FLU A&B, COVID)  RVPGX2
Influenza A by PCR: NEGATIVE
Influenza B by PCR: NEGATIVE
Resp Syncytial Virus by PCR: NEGATIVE
SARS Coronavirus 2 by RT PCR: NEGATIVE

## 2024-02-23 LAB — APTT: aPTT: 29 s (ref 24–36)

## 2024-02-23 LAB — ETHANOL: Alcohol, Ethyl (B): <15 mg/dL (ref <15)

## 2024-02-23 MED ORDER — IPRATROPIUM-ALBUTEROL 0.5-2.5 (3) MG/3ML IN SOLN
3.0000 mL | Freq: Once | RESPIRATORY_TRACT | Status: AC
  Administered 2024-02-23: 3 mL via RESPIRATORY_TRACT
  Filled 2024-02-23: qty 3

## 2024-02-23 MED ORDER — NAPHAZOLINE-GLYCERIN 0.012-0.25 % OP SOLN
1.0000 [drp] | Freq: Three times a day (TID) | OPHTHALMIC | Status: DC
  Filled 2024-02-23: qty 15

## 2024-02-23 MED ORDER — ALBUTEROL SULFATE (2.5 MG/3ML) 0.083% IN NEBU
2.5000 mg | INHALATION_SOLUTION | RESPIRATORY_TRACT | Status: DC | PRN
  Administered 2024-02-24: 2.5 mg via RESPIRATORY_TRACT
  Filled 2024-02-23: qty 3

## 2024-02-23 MED ORDER — STROKE: EARLY STAGES OF RECOVERY BOOK
Freq: Once | Status: AC
  Filled 2024-02-23: qty 1

## 2024-02-23 MED ORDER — ACETAMINOPHEN 325 MG PO TABS
650.0000 mg | ORAL_TABLET | ORAL | Status: DC | PRN
  Administered 2024-02-24: 650 mg via ORAL
  Filled 2024-02-23: qty 2

## 2024-02-23 MED ORDER — CLOPIDOGREL BISULFATE 75 MG PO TABS
75.0000 mg | ORAL_TABLET | Freq: Every day | ORAL | Status: DC
Start: 2024-02-23 — End: 2024-02-25
  Administered 2024-02-23 – 2024-02-25 (×3): 75 mg via ORAL
  Filled 2024-02-23 (×3): qty 1

## 2024-02-23 MED ORDER — INSULIN ASPART 100 UNIT/ML IJ SOLN
0.0000 [IU] | Freq: Three times a day (TID) | INTRAMUSCULAR | Status: DC
  Administered 2024-02-23 (×2): 2 [IU] via SUBCUTANEOUS
  Administered 2024-02-24: 3 [IU] via SUBCUTANEOUS
  Administered 2024-02-24: 2 [IU] via SUBCUTANEOUS
  Administered 2024-02-24: 7 [IU] via SUBCUTANEOUS
  Administered 2024-02-25: 3 [IU] via SUBCUTANEOUS

## 2024-02-23 MED ORDER — HYDRALAZINE HCL 25 MG PO TABS
37.5000 mg | ORAL_TABLET | Freq: Three times a day (TID) | ORAL | Status: DC
  Administered 2024-02-23 – 2024-02-25 (×6): 37.5 mg via ORAL
  Filled 2024-02-23 (×6): qty 2

## 2024-02-23 MED ORDER — HEPARIN SODIUM (PORCINE) 5000 UNIT/ML IJ SOLN
5000.0000 [IU] | Freq: Three times a day (TID) | INTRAMUSCULAR | Status: DC
  Administered 2024-02-23 – 2024-02-25 (×6): 5000 [IU] via SUBCUTANEOUS
  Filled 2024-02-23 (×6): qty 1

## 2024-02-23 MED ORDER — INSULIN ASPART 100 UNIT/ML IJ SOLN: 0.0000 [IU] | Freq: Every day | INTRAMUSCULAR | Status: DC

## 2024-02-23 MED ORDER — EZETIMIBE 10 MG PO TABS
10.0000 mg | ORAL_TABLET | Freq: Every day | ORAL | Status: DC
  Administered 2024-02-23 – 2024-02-25 (×3): 10 mg via ORAL
  Filled 2024-02-23 (×3): qty 1

## 2024-02-23 MED ORDER — PANTOPRAZOLE SODIUM 40 MG PO TBEC
40.0000 mg | DELAYED_RELEASE_TABLET | Freq: Every day | ORAL | Status: DC
Start: 2024-02-23 — End: 2024-02-25
  Administered 2024-02-23 – 2024-02-25 (×3): 40 mg via ORAL
  Filled 2024-02-23 (×3): qty 1

## 2024-02-23 MED ORDER — AMLODIPINE BESYLATE 5 MG PO TABS
10.0000 mg | ORAL_TABLET | Freq: Every day | ORAL | Status: DC
  Administered 2024-02-23 – 2024-02-25 (×3): 10 mg via ORAL
  Filled 2024-02-23 (×3): qty 2

## 2024-02-23 MED ORDER — ROSUVASTATIN CALCIUM 20 MG PO TABS
40.0000 mg | ORAL_TABLET | Freq: Every day | ORAL | Status: DC
  Administered 2024-02-23 – 2024-02-25 (×3): 40 mg via ORAL
  Filled 2024-02-23 (×3): qty 2

## 2024-02-23 MED ORDER — POLYVINYL ALCOHOL 1.4 % OP SOLN
1.0000 [drp] | Freq: Three times a day (TID) | OPHTHALMIC | Status: DC
  Administered 2024-02-23 – 2024-02-24 (×4): 2 [drp] via OPHTHALMIC
  Administered 2024-02-24 – 2024-02-25 (×3): 1 [drp] via OPHTHALMIC
  Filled 2024-02-23: qty 15

## 2024-02-23 MED ORDER — ASPIRIN 81 MG PO TBEC
81.0000 mg | DELAYED_RELEASE_TABLET | Freq: Every day | ORAL | Status: DC
  Administered 2024-02-23 – 2024-02-24 (×2): 81 mg via ORAL
  Filled 2024-02-23 (×2): qty 1

## 2024-02-23 NOTE — Progress Notes (Signed)
 Carotid duplex has been completed.   Results can be found under chart review under CV PROC. 02/23/2024 2:43 PM Fabricio Endsley RVT, RDMS

## 2024-02-23 NOTE — ED Notes (Signed)
 Pt back from CT

## 2024-02-23 NOTE — ED Notes (Signed)
 Patient transported to CT

## 2024-02-23 NOTE — ED Notes (Signed)
 The pt just had a eeg completed

## 2024-02-23 NOTE — Plan of Care (Signed)
  Problem: Education: Goal: Ability to describe self-care measures that may prevent or decrease complications (Diabetes Survival Skills Education) will improve Outcome: Progressing Goal: Individualized Educational Video(s) Outcome: Progressing   Problem: Coping: Goal: Ability to adjust to condition or change in health will improve Outcome: Progressing   Problem: Fluid Volume: Goal: Ability to maintain a balanced intake and output will improve Outcome: Progressing   Problem: Health Behavior/Discharge Planning: Goal: Ability to identify and utilize available resources and services will improve Outcome: Progressing Goal: Ability to manage health-related needs will improve Outcome: Progressing   Problem: Metabolic: Goal: Ability to maintain appropriate glucose levels will improve Outcome: Progressing   Problem: Nutritional: Goal: Maintenance of adequate nutrition will improve Outcome: Progressing Goal: Progress toward achieving an optimal weight will improve Outcome: Progressing   Problem: Skin Integrity: Goal: Risk for impaired skin integrity will decrease Outcome: Progressing   Problem: Tissue Perfusion: Goal: Adequacy of tissue perfusion will improve Outcome: Progressing   Problem: Education: Goal: Knowledge of disease or condition will improve Outcome: Progressing Goal: Knowledge of secondary prevention will improve (MUST DOCUMENT ALL) Outcome: Progressing Goal: Knowledge of patient specific risk factors will improve (DELETE if not current risk factor) Outcome: Progressing   Problem: Ischemic Stroke/TIA Tissue Perfusion: Goal: Complications of ischemic stroke/TIA will be minimized Outcome: Progressing   Problem: Coping: Goal: Will verbalize positive feelings about self Outcome: Progressing Goal: Will identify appropriate support needs Outcome: Progressing   Problem: Health Behavior/Discharge Planning: Goal: Ability to manage health-related needs will  improve Outcome: Progressing Goal: Goals will be collaboratively established with patient/family Outcome: Progressing   Problem: Self-Care: Goal: Ability to participate in self-care as condition permits will improve Outcome: Progressing Goal: Verbalization of feelings and concerns over difficulty with self-care will improve Outcome: Progressing Goal: Ability to communicate needs accurately will improve Outcome: Progressing   Problem: Nutrition: Goal: Risk of aspiration will decrease Outcome: Progressing Goal: Dietary intake will improve Outcome: Progressing

## 2024-02-23 NOTE — Procedures (Signed)
 Patient Name: Erica [name]  MRN: [id number]  Epilepsy Attending: Arlin MALVA Krebs  Referring Physician/Provider: Jerri Pfeiffer, MD  Date: 02/23/2024  Duration: 29.24 mins  Patient history: 68 y.o. female with PMH of hypertension, diabetes, HLD, COPD, recurrent strokes, CKD3b admitted for confusion and not able to speak well.  EEG to evaluate for seizure  Level of alertness: Awake, asleep  AEDs during EEG study: None  Technical aspects: This EEG study was done with scalp electrodes positioned according to the 10-20 International system of electrode placement. Electrical activity was reviewed with band pass filter of 1-70Hz , sensitivity of 7 uV/mm, display speed of 65mm/sec with a 60Hz  notched filter applied as appropriate. EEG data were recorded continuously and digitally stored.  Video monitoring was available and reviewed as appropriate.  Description: The posterior dominant rhythm consists of 8-9 Hz activity of moderate voltage (25-35 uV) seen predominantly in posterior head regions, symmetric and reactive to eye opening and eye closing. Sleep was characterized by vertex waves, sleep spindles (12 to 14 Hz), maximal frontocentral region. EEG showed intermittent 3 to 5 Hz theta-delta slowing in left temporal region. Hyperventilation and photic stimulation were not performed.      ABNORMALITY - Intermittent slow, left temporal region   IMPRESSION: This study is suggestive of cortical dysfunction arising from left temporal region. No seizures or definite epileptiform discharges were seen throughout the recording.   Please note lack of epileptiform abnormality during interictal EEG does not exclude the diagnosis of epilepsy.     Shameek Nyquist O Coleston Dirosa

## 2024-02-23 NOTE — Care Management Obs Status (Signed)
 MEDICARE OBSERVATION STATUS NOTIFICATION   Patient Details  Name: Erica [name] MRN: [id number] Date of Birth: 1955/11/06   Medicare Observation Status Notification Given:  Yes    Jon Cruel 02/23/2024, 3:23 PM

## 2024-02-23 NOTE — ED Notes (Signed)
 Pt told we needed a urine sample and this RN offered an in and out cath, pt expressed they did not want an in and out cath at this time.

## 2024-02-23 NOTE — Progress Notes (Signed)
 Routine EEG completed, results pending Neurology review and interpretation

## 2024-02-23 NOTE — Consult Note (Addendum)
 Stroke Neurology Consultation Note  Consult Requested by: dr. Rosan  Reason for Consult: stroke  Consult Date: 02/23/24   The history was obtained from the pt and chart.  During history and examination, all items were able to obtain unless otherwise noted.  History of Present Illness:  Erica [name] is a 68 y.o. African American female with PMH of hypertension, diabetes, HLD, COPD, recurrent strokes, CKD3b admitted for stroke. Per pt, she went to her sister's house yesterday and feeling fine. Later she was at home, and she called her son and she felt she was confused and not able to speak well. Her son called EMS and she was sent to ED for evaluation. MRI showed right thalamic and right frontal cortical 2 small infarcts.  UA neg.   Per chart, pt during the call, she was not responding appropriately and appeared different from her baseline, despite her known history of dementia. Sister also started a few days of confusion and mental status change. Pt stated that she had some mild HA for the last 2-3 days and now is better but not resolved.   She had multiple strokes in the past, the last one in 09/2023. She has loop recorder and no afib found to date. She also has residue speech difficulty and left sided weakness and numbness. She is not sure which blood thinner she is on at home.   LSN: several days ago TNK Given: No: outside window IR Thrombectomy? 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   GERD (gastroesophageal reflux disease)    with chronic cough   Headache(784.0)    mild   Hypertension    Loop Biotronik loop implant 12/16/2020 12/16/2020   Mental disorder    depression   Neuropathy    feet    Pain     arthritis pain - takes tramadol  as needed   Pulmonary fibrosis, unspecified (HCC)    Rectal cancer (HCC)    Rectal polyp    very little bleeding with bowel movements- no pain   Stroke (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   Suicide attempt [facility])     Past Surgical History:  Procedure Laterality Date   ANAL RECTAL MANOMETRY N/A 07/13/2016   Procedure: ANO RECTAL MANOMETRY;  Surgeon: Bernarda Ned, MD;  Location: [facility];  Service: Endoscopy;  Laterality: N/A;   CESAREAN SECTION     EUS N/A 11/21/2012   Procedure: LOWER ENDOSCOPIC ULTRASOUND (EUS);  Surgeon: Elsie Cree, MD;  Location: [facility];  Service: Endoscopy;  Laterality: N/A;   FLEXIBLE SIGMOIDOSCOPY N/A 11/21/2012   Procedure: FLEXIBLE SIGMOIDOSCOPY;  Surgeon: Elsie Cree, MD;  Location: [facility];  Service: Endoscopy;  Laterality: N/A;   FLEXIBLE SIGMOIDOSCOPY N/A 01/28/2013   Procedure: FLEXIBLE SIGMOIDOSCOPY;  Surgeon: Bernarda Ned, MD;  Location: [facility];  Service: Endoscopy;  Laterality: N/A;   LAPAROSCOPIC LOW ANTERIOR RESECTION N/A 01/29/2013   Procedure: LAPAROSCOPIC LOW ANTERIOR RESECTION, Rigid Proctoscopy;  Surgeon: Bernarda Ned, MD;  Location: [facility];  Service: General;  Laterality: N/A;   LAPAROSCOPIC SIGMOID COLECTOMY N/A 11/14/2012   Procedure: DIAGNOSTIC LAPAROSCOPY AND SIGMOIDMOIDOSCOPY ;  Surgeon: Donnice Bury, MD;  Location: [facility];  Service: General;  Laterality: N/A;   RECTAL ULTRASOUND N/A 07/13/2016  Procedure: RECTAL ULTRASOUND;  Surgeon: Bernarda Ned, MD;  Location: [facility];  Service: Endoscopy;  Laterality: N/A;   TONSILLECTOMY     TONSILLECTOMY AND ADENOIDECTOMY      Family History  Problem Relation Age of Onset   Heart disease Mother    Hypertension Mother    Hyperlipidemia Mother    Hypertension Father    Stroke Father    Diabetes Father    Hypertension Sister    Hypertension Sister    Cancer Maternal Aunt        cancer, unknown type    Bone cancer Maternal Uncle    Colon  cancer Maternal Uncle    Prostate cancer Maternal Uncle    Prostate cancer Maternal Uncle    Lung cancer Paternal Aunt    Lung cancer Paternal Uncle    Hypertension Brother    Hypertension Brother    Colon cancer Other 22   Breast cancer Neg Hx    BRCA 1/2 Neg Hx     Social History:  reports that she quit smoking about 6 years ago. Her smoking use included cigarettes. She started smoking about 26 years ago. She has a 10 pack-year smoking history. She has never used smokeless tobacco. She reports that she does not currently use alcohol . She reports that she does not currently use drugs after having used the following drugs: Crack cocaine.  Allergies:  Allergies  Allergen Reactions   Penicillins Hives, Itching and Swelling    Tongue swelling    No current facility-administered medications on file prior to encounter.   Current Outpatient Medications on File Prior to Encounter  Medication Sig Dispense Refill   acetaminophen  (TYLENOL ) 325 MG tablet Take 2 tablets (650 mg total) by mouth every 4 (four) hours as needed for mild pain (pain score 1-3) (or temp > 37.5 C (99.5 F)).     albuterol  (VENTOLIN  HFA) 108 (90 Base) MCG/ACT inhaler Inhale 2 puffs into the lungs every 4 (four) hours as needed for wheezing. 6.7 g 5   amLODipine  (NORVASC ) 10 MG tablet Take 1 tablet (10 mg total) by mouth daily. 30 tablet 0   benzoyl peroxide-erythromycin (BENZAMYCIN) gel Apply 1 Application topically every other day.     budesonide -formoterol  (SYMBICORT ) 160-4.5 MCG/ACT inhaler Inhale 2 puffs into the lungs 2 (two) times daily as needed (Asthma).     clindamycin  (CLEOCIN  T) 1 % lotion Apply 1 Application topically daily as needed (Irritation/Acne).     clopidogrel  (PLAVIX ) 75 MG tablet Take 1 tablet (75 mg total) by mouth daily. 30 tablet 0   dapagliflozin  propanediol (FARXIGA ) 10 MG TABS tablet Take 1 tablet (10 mg total) by mouth daily. 30 tablet 0   diclofenac  Sodium (VOLTAREN ) 1 % GEL Apply 1  Application topically 2 (two) times daily as needed (pain). 100 g 0   ezetimibe  (ZETIA ) 10 MG tablet Take 1 tablet (10 mg total) by mouth daily. 30 tablet 0   gabapentin  (NEURONTIN ) 100 MG capsule Take 1 capsule (100 mg total) by mouth 3 (three) times daily. (Patient taking differently: Take 100 mg by mouth daily as needed (Pain).) 90 capsule 0   hydrALAZINE  (APRESOLINE ) 25 MG tablet Take 37.5 mg by mouth 3 (three) times daily.     insulin  glargine (LANTUS ) 100 UNIT/ML Solostar Pen Inject 10 Units into the skin daily. Pen expires 28 days after first use (Patient taking differently: Inject 18-24 Units into the skin daily. Inject 24 units into the skin every morning and 18 units at night.)  15 mL 11   metroNIDAZOLE  (METROCREAM ) 0.75 % cream Apply 1 Application topically every other day.     naphazoline-glycerin  (CLEAR EYES REDNESS) 0.012-0.25 % SOLN Place 1-2 drops into both eyes 3 (three) times daily with meals. (Patient taking differently: Place 2 drops into both eyes daily as needed for eye irritation.)     nystatin  (MYCOSTATIN ) 100000 UNIT/ML suspension Take 5 mLs by mouth in the morning, at noon, and at bedtime.     pantoprazole  (PROTONIX ) 40 MG tablet Take 1 tablet (40 mg total) by mouth daily. 30 tablet 0   rosuvastatin  (CRESTOR ) 40 MG tablet Take 1 tablet (40 mg total) by mouth daily. 30 tablet 0   sertraline  (ZOLOFT ) 50 MG tablet Take 1 tablet (50 mg total) by mouth daily. 30 tablet 0   topiramate  (TOPAMAX ) 25 MG tablet Take 1 tablet (25 mg total) by mouth 2 (two) times daily. 60 tablet 11   Pirfenidone  801 MG TABS TAKE 1 TABLET (801 MG) BY MOUTH WITH BREAKFAST, WITH LUNCH, AND WITH EVENING MEAL. (Patient not taking: Reported on 02/23/2024) 270 tablet 0   venlafaxine  XR (EFFEXOR -XR) 150 MG 24 hr capsule Take 1 capsule (150 mg total) by mouth daily. (Patient not taking: Reported on 02/23/2024) 30 capsule 0    Review of Systems: A full ROS was attempted today and was able to be performed.  Systems  assessed include - Constitutional, Eyes, HENT, Respiratory, Cardiovascular, Gastrointestinal, Genitourinary, Integument/breast, Hematologic/lymphatic, Musculoskeletal, Neurological, Behavioral/Psych, Endocrine, Allergic/Immunologic - with pertinent responses as per HPI.  Physical Examination: Temp:  [97.8 F (36.6 C)-98.7 F (37.1 C)] 97.8 F (36.6 C) (09/12 1051) Pulse Rate:  [61-78] 70 (09/12 0845) Resp:  [16-25] 25 (09/12 0845) BP: (76-163)/(50-98) 146/80 (09/12 0845) SpO2:  [99 %-100 %] 100 % (09/12 0845) Weight:  [73 kg] 73 kg (09/11 2229)  General - well nourished, well developed, in no apparent distress.    Ophthalmologic - fundi not visualized due to noncooperation.    Cardiovascular - regular rhythm and rate  Neuro - awake, alert, eyes open, orientated to age, place, time and situation.  Mild hesitation of speech, psychomotor slowing, but following all simple commands. Able to name and repeat in dysarthric voice. No gaze palsy, tracking bilaterally, visual field full. No significant facial droop. Tongue midline.  Right upper and lower extremity 4/5, left upper extremity 3 -/5 and left lower extremity 2+/5. Sensation mildly decreased on the left, right FTN intact, left finger-to-nose ataxic not out of proportion to the weakness, gait not tested.    Data Reviewed: MR BRAIN WO CONTRAST Result Date: 02/23/2024 CLINICAL DATA:  68 year old female neurologic deficit. Respiratory distress. EXAM: MRI HEAD WITHOUT CONTRAST TECHNIQUE: Multiplanar, multiecho pulse sequences of the brain and surrounding structures were obtained without intravenous contrast. COMPARISON:  Head CT yesterday.  Brain MRI 09/25/2023. FINDINGS: Brain: Chronic infarcts and encephalomalacia: -Small chronic and patchy bilateral cerebellar infarcts. Multiple chronic lacunar infarcts in the bilateral thalami. Bilateral PCA territory encephalomalacia affecting the parietal and occipital lobes. Similar cortical and subcortical  white matter encephalomalacia at the bilateral superior frontal gyri. Additional periventricular white matter signal changes and multifocal discrete involvement of the corpus callosum. Comparatively mild to moderate chronic T2 heterogeneity in the pons. Superimposed punctate cortical infarct in the anterior right middle or superior frontal gyrus series 3, image 38. Punctate infarct also in the central right thalamus (series 3, image 25). No other convincing diffusion restriction. But chronic microhemorrhage in the left thalamus is new since April. Chronic hemosiderin in  the deep right cerebellar nuclei appears stable. No midline shift, mass effect, evidence of mass lesion, ventriculomegaly, extra-axial collection or acute intracranial hemorrhage. Cervicomedullary junction and pituitary are within normal limits. Vascular: Major intracranial vascular flow voids are stable since April. Skull and upper cervical spine: Normal background bone marrow signal. Age appropriate visible cervical spine degeneration. Sinuses/Orbits: Stable, negative. Other: Mastoids remain clear.  Negative visible scalp and face IMPRESSION: 1. Acute on chronic small vessel disease in the form of Punctate lacunar infarcts in both the right thalamus and the right frontal lobe. No associated hemorrhage or mass effect. 2. Underlying advanced chronic ischemic disease, largely stable since April this year although new chronic microhemorrhage in the left thalamus since that time. Electronically Signed   By: VEAR Hurst M.D.   On: 02/23/2024 09:53   CT CHEST ABDOMEN PELVIS WO CONTRAST Result Date: 02/23/2024 EXAM: CT CHEST, ABDOMEN AND PELVIS WITHOUT CONTRAST 02/23/2024 04:26:54 AM TECHNIQUE: CT of the chest, abdomen and pelvis was performed without the administration of intravenous contrast. Multiplanar reformatted images are provided for review. Automated exposure control, iterative reconstruction, and/or weight based adjustment of the mA/kV was  utilized to reduce the radiation dose to as low as reasonably achievable. COMPARISON: CT chest from 03/07/2022 and CT abdomen and pelvis from 08/17/2016. CLINICAL HISTORY: Respiratory distress. FINDINGS: CHEST: MEDIASTINUM AND LYMPH NODES: Heart size is at the upper limits of normal. Coronary artery calcifications are present. Loop recorder identified within the left ventral chest wall. No mediastinal, hilar or axillary lymphadenopathy. LUNGS AND PLEURA: Signs of fibrotic interstitial lung disease with bilateral peripheral predominant interstitial reticulation, traction bronchiectasis and subpleural honeycombing. 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 No suspicious lung nodules. No pleural fluid, interstitial edema or pneumothorax. ABDOMEN AND PELVIS: LIVER: The liver is unremarkable. GALLBLADDER AND BILE DUCTS: Multiple gallstones are identified which measure up to 5 mm, image 54/3. No CT findings of gallbladder wall thickening or pericholecystic inflammation. No bile duct dilatation. SPLEEN: Peripheral low attenuation structure within the spleen measures 1.3 cm, image 46/3. Not seen on previous exams. PANCREAS: No pancreatic inflammation, main duct dilatation or mass. ADRENAL GLANDS: Normal appearance of the adrenal glands. KIDNEYS, URETERS AND BLADDER: No stones in the kidneys or ureters. No hydronephrosis. No perinephric or periureteral stranding. Urinary bladder is unremarkable. GI AND BOWEL: The appendix is visualized and appears normal. Colonic diverticula identified without signs of acute diverticulitis. No abnormal bowel dilatation or inflammation. Signs of previous distal colonic resection with reanastomosis at the level of the rectum, image 102/3. REPRODUCTIVE ORGANS: The uterus appears normal. No adnexal mass. PERITONEUM AND RETROPERITONEUM: No free fluid or fluid collections within the abdomen or pelvis. No free air. VASCULATURE: Mild aortic atherosclerotic  calcification. ABDOMINAL AND PELVIS LYMPH NODES: No lymphadenopathy. BONES AND SOFT TISSUES: L5-S1 degenerative disc disease. No acute osseous abnormality. No focal soft tissue abnormality. IMPRESSION: 1. 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 2. Multiple gallstones, the largest measuring 5 mm, without CT findings of gallbladder wall thickening or pericholecystic inflammation. 3. Peripheral low attenuation structure within the spleen measuring 1.3 cm, not seen on previous exams. This is an indeterminate finding with differential considerations including benign and malignant splenic lesions as well as sequelae of the splenic infarct. Solitary splenic lesions are typically benign. However, in a patient with a known malignancy, consider follow-up imaging with non-emergent contrast-enhanced MRI of the abdomen. Electronically signed  by: Waddell Calk MD 02/23/2024 06:10 AM EDT RP Workstation: [id number]   DG Chest Portable 1 View Result Date: 02/23/2024 EXAM: 1 VIEW XRAY OF THE CHEST 02/23/2024 12:33:47 AM COMPARISON: 03/18/2023 CLINICAL HISTORY: SOB. FINDINGS: LUNGS AND PLEURA: Diffuse interstitial coarsening bilaterally greater on the left. No focal pulmonary opacity. No pulmonary edema. No pleural effusion. No pneumothorax. HEART AND MEDIASTINUM: Stable cardiomediastinal silhouette. Loop recorder. No acute abnormality of the cardiac and mediastinal silhouettes. BONES AND SOFT TISSUES: No acute osseous abnormality. IMPRESSION: 1. Diffuse interstitial coarsening bilaterally, greater on the left. This is compatible with interstitial lung disease. Superimposed infection or edema is difficult to exclude. Electronically signed by: Norman Gatlin MD 02/23/2024 12:40 AM EDT RP Workstation: [id number]   CT HEAD WO CONTRAST Result Date:  02/22/2024 EXAM: CT HEAD WITHOUT CONTRAST 02/22/2024 11:35:20 PM TECHNIQUE: CT of the head was performed without the administration of intravenous contrast. Automated exposure control, iterative reconstruction, and/or weight based adjustment of the mA/kV was utilized to reduce the radiation dose to as low as reasonably achievable. COMPARISON: CT and MRI 09/25/2023 CLINICAL HISTORY: Neuro deficit, acute, stroke suspected. Stroke suspected; Hx of several strokes. FINDINGS: BRAIN AND VENTRICLES: No acute hemorrhage. No evidence of acute infarct. No hydrocephalus. No extra-axial collection. No mass effect or midline shift. Chronic microvascular ischemic changes and generalized atrophy. Small chronic infarcts in the bilateral cerebellar hemispheres. Chronic lacunar infarcts in the thalamus and caudate bilaterally. ORBITS: No acute abnormality. SINUSES: Chronic sphenoid sinusitis. SOFT TISSUES AND SKULL: No acute soft tissue abnormality. No skull fracture. IMPRESSION: 1. No acute intracranial abnormality. 2. Similar microvascular ischemic change and chronic infarcts. Electronically signed by: Norman Gatlin MD 02/22/2024 11:43 PM EDT RP Workstation: [id number]   CUP PACEART REMOTE DEVICE CHECK Result Date: 02/20/2024 ILR summary report received. Battery status OK. Normal device function. No new symptom, tachy, brady, or pause episodes. Up to 20 new AF episodes per day per trends, longest 1 hr 44 min on 02/05/24 at 9:03 pm, EGMs c/w false detection due to SR with PAC and PVC ectopy, undersensing and oversensing; no true atrial arrhythmia appreciated. Monthly summary reports and ROV/PRN. MC, CVRS   Assessment: 68 y.o. female with PMH of hypertension, diabetes, HLD, COPD, recurrent strokes, CKD3b admitted for confusion and not able to speak well.  Reported mental status change and headache for last 2 to 3 days. She had multiple strokes in the past, the last one in 09/2023. She has loop recorder and no afib found to date.  She also has residue speech difficulty and left sided weakness and numbness. She is not sure which blood thinner she is on at home. MRI showed right thalamic and right frontal cortical 2 small infarcts.  Pt symptoms can not be fully explained by the MRI, will also work up to rule out metabolic encephalopathy. Recommend further stroke work up with MRA and carotid doppler, check LDL, A1C, P2Y12, B12 and TSH. UA neg so far. On DAPT now, will decide on the regimen based on P2Y12. Loop recorder interrogation. Seizure less likely but given hx of strokes, will do EEG to rule out further.    Plan: - HgbA1c, fasting lipid panel - MRA  of the brain without contrast - PT consult, OT consult, Speech consult - check P2Y12, B12, TSH and ammonia level - Carotid dopplers and routine EEG - loop recorder interrogation - Prophylactic therapy-ASA and plavix  for now and continue statin - Risk factor modification - Telemetry monitoring - Frequent neuro checks - will follow  Thank you for this consultation and allowing us  to participate in the care of this patient.  Ary Cummins, MD PhD Stroke Neurology 02/23/2024 1:58 PM

## 2024-02-23 NOTE — ED Provider Notes (Signed)
 Accepted handoff at shift change from Rebekah Sponseller PA-C. Please see prior provider note for more detail.   Briefly: Patient is 68 y.o.   DDX: concern for Multiple CVAs -- on ASA, plavix , baseline left sided deficits and intermittent stuttering. Sister at bedside reports seems fairly stable compared to baseline, but confused over the last few days. Slow to comprehend on exam. Some new shob, ILD on CT, and low bicarb. Normal CT head, ILD on CT chest. Reassess, possible admit for AMS vs close follow up.  Plan: Consulted with internal medicine team who agrees to admission for AMS of unknown etiology, possible hypercarbia.       Teanna Elem H, PA-C 02/23/24 [id number]    Lorette Mayo, MD 03/01/24 0200

## 2024-02-23 NOTE — H&P (Addendum)
 Date: 02/23/2024               Patient Name:  Erica [name] MRN: [id number]  DOB: [birth year] Age / Sex: 68 y.o., female   PCP: Delores Rojelio Caldron, NP         Medical Service: Internal Medicine Teaching Service         Attending Physician: Dr. Dayton Eastern      First Contact: Letha Cheadle, MD}    Second Contact: Dr. Roetta Chars, MD          Pager Information: First Contact Pager: [phone]   Second Contact Pager: [phone]   SUBJECTIVE   Chief Complaint: Altered mental status  History of Present Illness: Erica [name] is a 68 y.o. female with PMH of  CVAs, CKD stage III, COPD, GERD  who presented to the ED via EMS due to concerns for altered mental status for the past few days.   The patient's son reports that during a recent phone call, he noticed she was not responding appropriately and appeared different from her baseline, despite her known history of dementia. Her sister also observed behavioral changes and increased confusion over the past few days. The patient herself reports experiencing shortness of breath for several days and an increase in the frequency of her chronic headaches over the past week. These headaches, which last for a few minutes, are not triggered by light or movement. She takes topiramate  for headache management.  Notably, the patient was previously admitted on 09/25/2023 for acute right facial and tongue numbness with slurred speech; imaging revealed an acute lacunar infarct in the left thalamus along with multiple remote infarcts. She reports good adherence to her medications since that hospitalization.  She denies new weakness, cough, fever, chills, urinary symptoms, recent sick contacts, poor appetite, or recent falls.  ED Course: Labs : Complete blood count is normal, metabolic panel shows stable renal function, two troponin tests are negative, urinalysis reveals no infection, and the respiratory viral panel is negative. Imaging : Chest X-ray shows interstitial  lung disease with a possible superimposed infection but follow up  CT of the chest, abdomen, and pelvis was performed with contrast despite poor baseline renal function, revealing interstitial lung disease but no definitive evidence of acute superimposed infection. Received : Duonebs  Consulted :IMTS  Meds:  Patient reported: Patient couldn't mention her home meds. Please see chart  Past Medical History COPD Asthma Embolic stroke GERD Hypertension Diabetes mellitus Depression Pulmonary fibrosis Previous suicide attempt Peripheral neuropathy Arthritis  Past Surgical History Tonsillectomy and adenoidectomy  Social:  Lives With: Apartment by self  Occupation: Disabled Support: Self, son and sister  Level of Function: Has home health aid to help her with iADLs. But able to shower and use the bathroom .Uses a cane/walker for mobility.  PCP:  Delores Rojelio Caldron, NP  Substances: -Tobacco: Used to smoke 1/2 a day for over 40 years  . Last smoke was 4 years ago  -Alcohol : Last drink was over 9 years ago  -Recreational Drug: Cocaine when she was young  but nothing for over 40 years now.  Family History:  Mother -Hypertension, stroke, hyperlipidemia Father -hypertension, stroke, diabetes  Allergies: Penicillins - hives   Review of Systems: A complete ROS was negative except as per HPI.   OBJECTIVE:   Physical Exam: Blood pressure (!) 163/86, pulse 62, temperature 98.7 F (37.1 C), temperature source Oral, resp. rate 19, weight 73 kg, SpO2 100%.  Constitutional: Chronically ill appearing lady,laying  in bed , in no acte distress  HENT: normocephalic atraumatic, mucous membranes moist Cardiovascular: regular rate and rhythm, no m/r/g Pulmonary/Chest: normal work of breathing on room air, lungs clear to auscultation bilaterally Abdominal: soft, non-tender, non-distended MSK: normal bulk and tone Mental Status: Patient is awake, alert, oriented x3 Cranial Nerves: II: Pupils  equal, round, and reactive to light.   III,IV, VI: EOMI without ptosis or diploplia.  V: Facial sensation is symmetric to light touch and temperature. VII: Facial movement is mildly decreased on the left   VIII: Hearing is intact to voice X: Uvula elevates symmetrically XI: Shoulder shrug is symmetric. XII: Tongue is midline without atrophy or fasciculations.  Motor: 4/5 bilateral UE, 3/5 bilateral LE Sensory: Sensory deficit on face more pronounced on the left  Skin: warm and dry Psych: Normal mood and affect  Labs: CBC    Component Value Date/Time   WBC 7.0 02/22/2024 2250   RBC 4.29 02/22/2024 2250   HGB 11.2 (L) 02/23/2024 0749   HGB 10.7 (L) 06/21/2023 0841   HGB 12.5 03/02/2017 1428   HCT 33.0 (L) 02/23/2024 0749   HCT 35.4 03/02/2017 1428   PLT 166 02/22/2024 2250   PLT 157 06/21/2023 0841   PLT 192 03/02/2017 1428   MCV 90.0 02/22/2024 2250   MCV 94.3 03/02/2017 1428   MCH 32.2 02/22/2024 2250   MCHC 35.8 02/22/2024 2250   RDW 16.3 (H) 02/22/2024 2250   RDW 14.6 (H) 03/02/2017 1428   LYMPHSABS 1.4 02/22/2024 2250   LYMPHSABS 1.1 03/02/2017 1428   MONOABS 0.5 02/22/2024 2250   MONOABS 0.4 03/02/2017 1428   EOSABS 0.0 02/22/2024 2250   EOSABS 0.1 03/02/2017 1428   BASOSABS 0.0 02/22/2024 2250   BASOSABS 0.1 03/02/2017 1428     CMP     Component Value Date/Time   NA 140 02/23/2024 0749   NA 142 11/01/2021 1403   NA 131 (L) 03/02/2017 1428   K 4.8 02/23/2024 0749   K 4.8 03/02/2017 1428   CL 115 (H) 02/22/2024 2305   CO2 16 (L) 02/22/2024 2250   CO2 24 03/02/2017 1428   GLUCOSE 185 (H) 02/22/2024 2305   GLUCOSE 482 (H) 03/02/2017 1428   BUN 44 (H) 02/22/2024 2305   BUN 27 11/01/2021 1403   BUN 20.0 03/02/2017 1428   CREATININE 2.20 (H) 02/22/2024 2305   CREATININE 1.62 (H) 06/21/2023 0841   CREATININE 1.90 (H) 01/12/2022 1218   CREATININE 1.7 (H) 03/02/2017 1428   CALCIUM  8.9 02/22/2024 2250   CALCIUM  9.2 03/02/2017 1428   PROT 7.2 02/22/2024 2250    PROT 7.5 03/08/2021 1144   PROT 8.0 03/02/2017 1428   ALBUMIN 3.7 02/22/2024 2250   ALBUMIN 4.3 03/08/2021 1144   ALBUMIN 3.2 (L) 03/02/2017 1428   AST 19 02/22/2024 2250   AST 14 (L) 06/21/2023 0841   AST 12 03/02/2017 1428   ALT 15 02/22/2024 2250   ALT 8 06/21/2023 0841   ALT 8 03/02/2017 1428   ALKPHOS 92 02/22/2024 2250   ALKPHOS 139 03/02/2017 1428   BILITOT 1.1 02/22/2024 2250   BILITOT 0.8 06/21/2023 0841   BILITOT 0.99 03/02/2017 1428   GFRNONAA 24 (L) 02/22/2024 2250   GFRNONAA 34 (L) 06/21/2023 0841   GFRNONAA 36 (L) 11/30/2020 1505   GFRAA 42 (L) 11/30/2020 1505    Imaging:  MR BRAIN WO CONTRAST Result Date: 02/23/2024 CLINICAL DATA:  68 year old female neurologic deficit. Respiratory distress. EXAM: MRI HEAD WITHOUT CONTRAST TECHNIQUE: Multiplanar, multiecho  pulse sequences of the brain and surrounding structures were obtained without intravenous contrast. COMPARISON:  Head CT yesterday.  Brain MRI 09/25/2023. FINDINGS: Brain: Chronic infarcts and encephalomalacia: -Small chronic and patchy bilateral cerebellar infarcts. Multiple chronic lacunar infarcts in the bilateral thalami. Bilateral PCA territory encephalomalacia affecting the parietal and occipital lobes. Similar cortical and subcortical white matter encephalomalacia at the bilateral superior frontal gyri. Additional periventricular white matter signal changes and multifocal discrete involvement of the corpus callosum. Comparatively mild to moderate chronic T2 heterogeneity in the pons. Superimposed punctate cortical infarct in the anterior right middle or superior frontal gyrus series 3, image 38. Punctate infarct also in the central right thalamus (series 3, image 25). No other convincing diffusion restriction. But chronic microhemorrhage in the left thalamus is new since April. Chronic hemosiderin in the deep right cerebellar nuclei appears stable. No midline shift, mass effect, evidence of mass lesion,  ventriculomegaly, extra-axial collection or acute intracranial hemorrhage. Cervicomedullary junction and pituitary are within normal limits. Vascular: Major intracranial vascular flow voids are stable since April. Skull and upper cervical spine: Normal background bone marrow signal. Age appropriate visible cervical spine degeneration. Sinuses/Orbits: Stable, negative. Other: Mastoids remain clear.  Negative visible scalp and face IMPRESSION: 1. Acute on chronic small vessel disease in the form of Punctate lacunar infarcts in both the right thalamus and the right frontal lobe. No associated hemorrhage or mass effect. 2. Underlying advanced chronic ischemic disease, largely stable since April this year although new chronic microhemorrhage in the left thalamus since that time. Electronically Signed   By: VEAR Hurst M.D.   On: 02/23/2024 09:53   CT CHEST ABDOMEN PELVIS WO CONTRAST Result Date: 02/23/2024 EXAM: CT CHEST, ABDOMEN AND PELVIS WITHOUT CONTRAST 02/23/2024 04:26:54 AM TECHNIQUE: CT of the chest, abdomen and pelvis was performed without the administration of intravenous contrast. Multiplanar reformatted images are provided for review. Automated exposure control, iterative reconstruction, and/or weight based adjustment of the mA/kV was utilized to reduce the radiation dose to as low as reasonably achievable. COMPARISON: CT chest from 03/07/2022 and CT abdomen and pelvis from 08/17/2016. CLINICAL HISTORY: Respiratory distress. FINDINGS: CHEST: MEDIASTINUM AND LYMPH NODES: Heart size is at the upper limits of normal. Coronary artery calcifications are present. Loop recorder identified within the left ventral chest wall. No mediastinal, hilar or axillary lymphadenopathy. LUNGS AND PLEURA: Signs of fibrotic interstitial lung disease with bilateral peripheral predominant interstitial reticulation, traction bronchiectasis and subpleural honeycombing. This has been previously characterized as usual interstitial  pneumonia (UIP). No suspicious lung nodules. No pleural fluid, interstitial edema or pneumothorax. ABDOMEN AND PELVIS: LIVER: The liver is unremarkable. GALLBLADDER AND BILE DUCTS: Multiple gallstones are identified which measure up to 5 mm, image 54/3. No CT findings of gallbladder wall thickening or pericholecystic inflammation. No bile duct dilatation. SPLEEN: Peripheral low attenuation structure within the spleen measures 1.3 cm, image 46/3. Not seen on previous exams. PANCREAS: No pancreatic inflammation, main duct dilatation or mass. ADRENAL GLANDS: Normal appearance of the adrenal glands. KIDNEYS, URETERS AND BLADDER: No stones in the kidneys or ureters. No hydronephrosis. No perinephric or periureteral stranding. Urinary bladder is unremarkable. GI AND BOWEL: The appendix is visualized and appears normal. Colonic diverticula identified without signs of acute diverticulitis. No abnormal bowel dilatation or inflammation. Signs of previous distal colonic resection with reanastomosis at the level of the rectum, image 102/3. REPRODUCTIVE ORGANS: The uterus appears normal. No adnexal mass. PERITONEUM AND RETROPERITONEUM: No free fluid or fluid collections within the abdomen or pelvis. No free  air. VASCULATURE: Mild aortic atherosclerotic calcification. ABDOMINAL AND PELVIS LYMPH NODES: No lymphadenopathy. BONES AND SOFT TISSUES: L5-S1 degenerative disc disease. No acute osseous abnormality. No focal soft tissue abnormality. IMPRESSION: 1. 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 2. Multiple gallstones, the largest measuring 5 mm, without CT findings of gallbladder wall thickening or pericholecystic inflammation. 3. Peripheral low attenuation structure within the spleen measuring 1.3 cm, not seen on previous exams. This is an  indeterminate finding with differential considerations including benign and malignant splenic lesions as well as sequelae of the splenic infarct. Solitary splenic lesions are typically benign. However, in a patient with a known malignancy, consider follow-up imaging with non-emergent contrast-enhanced MRI of the abdomen. Electronically signed by: Waddell Calk MD 02/23/2024 06:10 AM EDT RP Workstation: [id number]   DG Chest Portable 1 View Result Date: 02/23/2024 EXAM: 1 VIEW XRAY OF THE CHEST 02/23/2024 12:33:47 AM COMPARISON: 03/18/2023 CLINICAL HISTORY: SOB. FINDINGS: LUNGS AND PLEURA: Diffuse interstitial coarsening bilaterally greater on the left. No focal pulmonary opacity. No pulmonary edema. No pleural effusion. No pneumothorax. HEART AND MEDIASTINUM: Stable cardiomediastinal silhouette. Loop recorder. No acute abnormality of the cardiac and mediastinal silhouettes. BONES AND SOFT TISSUES: No acute osseous abnormality. IMPRESSION: 1. Diffuse interstitial coarsening bilaterally, greater on the left. This is compatible with interstitial lung disease. Superimposed infection or edema is difficult to exclude. Electronically signed by: Norman Gatlin MD 02/23/2024 12:40 AM EDT RP Workstation: [id number]   CT HEAD WO CONTRAST Result Date: 02/22/2024 EXAM: CT HEAD WITHOUT CONTRAST 02/22/2024 11:35:20 PM TECHNIQUE: CT of the head was performed without the administration of intravenous contrast. Automated exposure control, iterative reconstruction, and/or weight based adjustment of the mA/kV was utilized to reduce the radiation dose to as low as reasonably achievable. COMPARISON: CT and MRI 09/25/2023 CLINICAL HISTORY: Neuro deficit, acute, stroke suspected. Stroke suspected; Hx of several strokes. FINDINGS: BRAIN AND VENTRICLES: No acute hemorrhage. No evidence of acute infarct. No hydrocephalus. No extra-axial collection. No mass effect or midline shift. Chronic microvascular ischemic changes and generalized  atrophy. Small chronic infarcts in the bilateral cerebellar hemispheres. Chronic lacunar infarcts in the thalamus and caudate bilaterally. ORBITS: No acute abnormality. SINUSES: Chronic sphenoid sinusitis. SOFT TISSUES AND SKULL: No acute soft tissue abnormality. No skull fracture. IMPRESSION: 1. No acute intracranial abnormality. 2. Similar microvascular ischemic change and chronic infarcts. Electronically signed by: Norman Gatlin MD 02/22/2024 11:43 PM EDT RP Workstation: [id number]     EKG: personally reviewed my interpretation is normal sinus rhythm   ASSESSMENT & PLAN:   Assessment & Plan by Problem: Principal Problem:   Acute alteration in mental status Active Problems:   Interstitial lung disease (HCC)   COPD (chronic obstructive pulmonary disease) (HCC)   GERD (gastroesophageal reflux disease)   MDD (major depressive disorder), recurrent severe, without psychosis (HCC)   Acute CVA (cerebrovascular accident) (HCC)   Chronic kidney disease, stage 3b (HCC)   History of CVA with residual deficit   Type 2 diabetes mellitus with hypoglycemia, with long-term current use of insulin  (HCC)   Erica [name] is a 68 year old woman with a history of COPD, recent cerebrovascular accident  and chronic kidney disease, who presented with altered mental status and was admitted for acute punctate lacunar infarcts in the right thalamus and right frontal lobe.  #Altered mental status Erica [name] presents with altered mental status likely from an acute on chronic small vessel disease in  the form of punctate lacunar infarcts in both the right thalamus and the right frontal lobe as seen on brain MRI and complicated by  hypoxia-related encephalopathy in the setting of chronic lung disease and resulting hypoxemia. Initial workup reveals metabolic acidosis with respiratory compensation on venous blood gas, hypoxemia, and interstitial lung disease on chest imaging, with no definitive evidence of acute infection on  CT. Laboratory studies--including CBC, CMP with stable renal function, troponins, urinalysis, and respiratory viral panel--are unremarkable. - Case discussed with Dr Jerri( Neurology)  and will see patient today - Appreciate Neurology recs  - Neurologic checks - Resume her home ASA and Plavix , Crestor  and Zetia   - Administer supplemental oxygen if needed to maintain SpO? > 92% - Monitor serial VBGs and BMP to assess acid-base status - Consider holding or adjusting all centrally acting medications  AMS persists or worsens - Fall precautions  - PT/OT - Speech Therapy   #Acute on chronic punctate lacunar infarcts in both the right thalamus and the right frontal lobe MRI brain showed acute on chronic small vessel disease in the form of punctate lacunar infarcts in both the right thalamus and the right frontal lobe . Fortunately no new deficit noted on exam from baseline.Neurology is following to see if any immediate intervention is warranted.  - Follow up on neuro recs per above  - HTN control   #COPD Not in acute execration at this time - Duonebs as needed   #Interstitial Lung disease  -  Out patient pulmonology consult for interstitial lung disease management as needed  #Multiple gallstones without gallbladder wall thickening or pericholecystic inflammation - No immediate intervention needed - Patient is currently asymptomatic or has no signs of biliary obstruction or infection. - I counseled the patient on  signs and symptoms of biliary colic and cholecystitis and when to seek medical attentions   #Incidental Solitary splenic lesions  The findings are likely benign. At this time, no urgent intervention is required. A follow-up MRI can be scheduled non-emergently in the future if clinical symptoms develop or if further evaluation is warranted.  #Type 2 diabetes mellitus with hyperglycemia with long-term current use of insulin  Her diabetes appears well-controlled, with a hemoglobin A1c of  5.6 during her last admission in April. Given this, it is unclear whether she requires ongoing insulin  therapy at home, although her blood glucose levels have been in the 170s during this hospitalization. I will initiate a carbohydrate-modified diet and begin sliding scale insulin  during her inpatient stay. - CBG monitoring  - Blood glucose levels elevated in the 170s despite prior A1c of 5.6. - Start carbohydrate-controlled diet. - Initiate sliding scale insulin  with close monitoring of blood glucose. - Reassess need for long-term insulin  therapy prior to discharge.  #CKD 3b - Cr at baseline of (1.6 - 2.3 ) - Monitor I/Os and daily weights - Trend Cr   #HTN - Resume home Amlodipine  10 mg daily   #GERD - Resume home Protonix    #Major depressive disorder, recurrent severe without psychosis - Holding home Effexor  and Zoloft  in the setting of acute mental status change    Best practice: Diet: Carb/Renal VTE: Heparin  IVF: None,None Code: DNR  Disposition planning: Prior to Admission Living Arrangement: Home, living by self  Anticipated Discharge Location: pending further work up   Dispo: Admit patient to Observation with expected length of stay less than 2 midnights. Given her recent stroke and that she lives alone, I am concerned that the burden of new medications may  be impacting her adherence, which could have contributed to her recurrent strokes, despite her report of using a pill pack. A more reliable medication adherence plan must be established before discharge to reduce the risk of readmission.  Signed: Renne Homans, MD Internal Medicine Resident  02/23/2024, 1:43 PM  On Call pager: [phone]

## 2024-02-23 NOTE — Progress Notes (Incomplete)
 HD#0 SUBJECTIVE:  Patient Summary: Erica [name] is a 68 y.o. 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   Overnight Events: NAEON  Interim History: Patient states that she feels much better this morning.  Patient's sister who is her main caretaker was on the phone.  Patient wants to live on her own.  She did not understand that she had a stroke which we shared with the patient today.  Notes that she is having some trouble with leg weakness and trouble speaking since her last stroke.  Will start speech therapy outpatient soon.  OBJECTIVE:  Vital Signs: Vitals:   02/24/24 0432 02/24/24 0616 02/24/24 0759 02/24/24 1201  BP: (!) 143/84 (!) 166/96  139/82  Pulse: 65   71  Resp:    16  Temp: (!) 97.5 F (36.4 C)  (!) 97.5 F (36.4 C) 98.5 F (36.9 C)  TempSrc: Oral  Oral Oral  SpO2: 100%  100% 100%  Weight:      Height:        Filed Weights   02/22/24 2229  Weight: 73 kg    No intake or output data in the 24 hours ending 02/24/24 1246 Net IO Since Admission: No IO data has been entered for this period [02/24/24 1246]  Physical Exam: Constitutional: chronically ill-appearing, well-nourished, in no acute distress HENT: normocephalic atraumatic, mucous membranes moist Eyes: conjunctiva non-erythematous, PERRL, no scleral icterus Cardiovascular: regular rate and rhythm, no m/r/g Pulmonary/Chest: normal work of breathing on room air Abdominal: soft, non-tender, non-distended, bowel sounds normal MSK: normal bulk and tone Skin: warm and dry Extremities: no edema or cyanosis; peripheral pulses intact Psych: normal mood and affect, thought content normal Neurological: alert & oriented to person, place, year, and month. Did not understand  reason for current hospitalization.  Word-finding difficulty.   Patient Lines/Drains/Airways Status     Active Line/Drains/Airways     Name Placement date Placement time Site Days   Peripheral IV 02/22/24 20 G Anterior;Left;Proximal Forearm 02/22/24  2251  Forearm  1            Pertinent labs and imaging:     Latest Ref Rng & Units 02/24/2024    5:46 AM 02/23/2024    7:49 AM 02/22/2024   11:05 PM  CBC  WBC 4.0 - 10.5 K/uL 3.8     Hemoglobin 12.0 - 15.0 g/dL 87.1  88.7  86.6   Hematocrit 36.0 - 46.0 % 35.9  33.0  39.0   Platelets 150 - 400 K/uL 151          Latest Ref Rng & Units 02/24/2024    5:46 AM 02/23/2024    7:49 AM 02/22/2024   11:05 PM  CMP  Glucose 70 - 99 mg/dL 820   814   BUN 8 - 23 mg/dL 34   44   Creatinine 9.55 - 1.00 mg/dL 8.19   7.79   Sodium 864 - 145 mmol/L 140  140  139   Potassium 3.5 - 5.1 mmol/L 4.4  4.8  5.1   Chloride 98 - 111 mmol/L 114   115   CO2 22 - 32 mmol/L 16     Calcium  8.9 - 10.3 mg/dL 8.9     Total Protein 6.5 - 8.1 g/dL 6.9  Total Bilirubin 0.0 - 1.2 mg/dL 0.8     Alkaline Phos 38 - 126 U/L 80     AST 15 - 41 U/L 18     ALT 0 - 44 U/L 15       MR ANGIO HEAD WO CONTRAST Result Date: 02/23/2024 CLINICAL DATA:  Follow-up examination for acute stroke. EXAM: MRA HEAD WITHOUT CONTRAST TECHNIQUE: Angiographic images of the Circle of Willis were acquired using MRA technique without intravenous contrast. COMPARISON:  Comparison with brain MRI from earlier the same day as well as prior CTA from 09/25/2023. FINDINGS: Anterior circulation: Both internal carotid arteries are patent through the siphons without stenosis or other abnormality. A1 segments patent bilaterally. Normal anterior communicating artery complex. Minor atheromatous irregularity about the ACAs without significant stenosis. No M1 stenosis or occlusion. No proximal MCA branch occlusion. Mild atheromatous irregularity about the distal MCA branches without high-grade stenosis.  Posterior circulation: Both V4 segments patent without stenosis. Both PICA patent. Focal fenestration at the vertebrobasilar junction noted. Basilar widely patent without stenosis. Superior cerebellar arteries patent bilaterally. Both PCAs supplied via the basilar. Minimal atheromatous irregularity about the PCAs without significant stenosis. Anatomic variants: None significant. Other: No intracranial aneurysm. IMPRESSION: 1. Negative intracranial MRA for large vessel occlusion. 2. Mild intracranial atherosclerotic disease without hemodynamically significant or correctable stenosis. Electronically Signed   By: Morene Hoard M.D.   On: 02/23/2024 19:18   VAS US  CAROTID Result Date: 02/23/2024 Carotid Arterial Duplex Study Patient Name:  Erica [name]  Date of Exam:   02/23/2024 Medical Rec #: [id number]     Accession #:    [id number] Date of Birth: 06-01-1956     Patient Gender: F Patient Age:   46 years Exam Location:  [facility] Procedure:      VAS US  CAROTID Referring Phys: ARY XU --------------------------------------------------------------------------------  Indications:       CVA. Risk Factors:      Hypertension, hyperlipidemia, Diabetes, past history of                    smoking, prior CVA. Other Factors:     CKD. Comparison Study:  Previous exam on 03/05/2023 BIL ICA <50% Performing Technologist: Ezzie Potters RVT, RDMS  Examination Guidelines: A complete evaluation includes B-mode imaging, spectral Doppler, color Doppler, and power Doppler as needed of all accessible portions of each vessel. Bilateral testing is considered an integral part of a complete examination. Limited examinations for reoccurring indications may be performed as noted.  Right Carotid Findings: +----------+--------+--------+--------+------------------+------------------+           PSV cm/sEDV cm/sStenosisPlaque DescriptionComments            +----------+--------+--------+--------+------------------+------------------+ CCA Prox  61      9                                                    +----------+--------+--------+--------+------------------+------------------+ CCA Distal34      10                                intimal thickening +----------+--------+--------+--------+------------------+------------------+ ICA Prox  41      15                                                   +----------+--------+--------+--------+------------------+------------------+  ICA Mid                                             tortuous           +----------+--------+--------+--------+------------------+------------------+ ICA Distal93      29                                tortuous           +----------+--------+--------+--------+------------------+------------------+ ECA       25      4                                                    +----------+--------+--------+--------+------------------+------------------+ +----------+--------+-------+----------------+-------------------+           PSV cm/sEDV cmsDescribe        Arm Pressure (mmHG) +----------+--------+-------+----------------+-------------------+ [id number]             Multiphasic, WNL                    +----------+--------+-------+----------------+-------------------+ +---------+--------+--+--------+--+---------+ VertebralPSV cm/s40EDV cm/s10Antegrade +---------+--------+--+--------+--+---------+  Left Carotid Findings: +----------+--------+--------+--------+------------------+------------------+           PSV cm/sEDV cm/sStenosisPlaque DescriptionComments           +----------+--------+--------+--------+------------------+------------------+ CCA Prox  43      11                                intimal thickening +----------+--------+--------+--------+------------------+------------------+ CCA Distal46      12                                 intimal thickening +----------+--------+--------+--------+------------------+------------------+ ICA Prox  37      14              calcific and focal                   +----------+--------+--------+--------+------------------+------------------+ ICA Mid                                             tortuous           +----------+--------+--------+--------+------------------+------------------+ ICA Distal34      13                                tortuous           +----------+--------+--------+--------+------------------+------------------+ ECA       27      6                                                    +----------+--------+--------+--------+------------------+------------------+ +----------+--------+--------+----------------+-------------------+           PSV cm/sEDV cm/sDescribe        Arm Pressure (  mmHG) +----------+--------+--------+----------------+-------------------+ [id number]              Multiphasic, WNL                    +----------+--------+--------+----------------+-------------------+ +---------+--------+--+--------+--+---------+ VertebralPSV cm/s34EDV cm/s12Antegrade +---------+--------+--+--------+--+---------+   Summary: Right Carotid: The extracranial vessels were near-normal with only minimal wall                thickening or plaque. Left Carotid: The extracranial vessels were near-normal with only minimal wall               thickening or plaque. Vertebrals:  Bilateral vertebral arteries demonstrate antegrade flow. Subclavians: Normal flow hemodynamics were seen in bilateral subclavian              arteries. *See table(s) above for measurements and observations.  Electronically signed by Ary Cummins MD on 02/23/2024 at 6:20:45 PM.    Final    EEG adult Result Date: 02/23/2024 Shelton Arlin KIDD, MD     02/23/2024  4:22 PM Patient Name: SHAMIYAH NGU MRN: [id number] Epilepsy Attending: Arlin KIDD Shelton Referring Physician/Provider: Cummins Ary, MD Date: 02/23/2024 Duration: 29.24 mins Patient history: 68 y.o. female with PMH of hypertension, diabetes, HLD, COPD, recurrent strokes, CKD3b admitted for confusion and not able to speak well.  EEG to evaluate for seizure Level of alertness: Awake, asleep AEDs during EEG study: None Technical aspects: This EEG study was done with scalp electrodes positioned according to the 10-20 International system of electrode placement. Electrical activity was reviewed with band pass filter of 1-70Hz , sensitivity of 7 uV/mm, display speed of 31mm/sec with a 60Hz  notched filter applied as appropriate. EEG data were recorded continuously and digitally stored.  Video monitoring was available and reviewed as appropriate. Description: The posterior dominant rhythm consists of 8-9 Hz activity of moderate voltage (25-35 uV) seen predominantly in posterior head regions, symmetric and reactive to eye opening and eye closing. Sleep was characterized by vertex waves, sleep spindles (12 to 14 Hz), maximal frontocentral region. EEG showed intermittent 3 to 5 Hz theta-delta slowing in left temporal region. Hyperventilation and photic stimulation were not performed.    ABNORMALITY - Intermittent slow, left temporal region  IMPRESSION: This study is suggestive of cortical dysfunction arising from left temporal region. No seizures or definite epileptiform discharges were seen throughout the recording.  Please note lack of epileptiform abnormality during interictal EEG does not exclude the diagnosis of epilepsy.   Arlin KIDD Shelton    ASSESSMENT/PLAN:  Assessment: Principal Problem:   Acute alteration in mental status Active Problems:   Interstitial lung disease (HCC)   COPD (chronic obstructive pulmonary disease) (HCC)   GERD (gastroesophageal reflux disease)   MDD (major depressive disorder), recurrent severe, without psychosis (HCC)   Acute CVA (cerebrovascular accident) (HCC)   Chronic kidney disease, stage 3b (HCC)    History of CVA with residual deficit   Type 2 diabetes mellitus with hypoglycemia, with long-term current use of insulin  (HCC)   Plan: #Acute Ischemic CVA #History of Multiple Ischemic CVA Initial presentation of altered mental status has improved according to the patient. No new obvious deficits today. Patient appears improved in terms of orientation from admission.  Carotid US  showed minimal thickening/plaque, MRI brain confirmed acute on chronic vascular changes within acute punctate lacunar infarcts in the right thalamus and right frontal lobe without hemorrhage or mass effect.  MRA was negative for large vessel occlusion. Loop recorder interrogation showed no atrial fibrillation episodes. TSH, vitamin B12  wnl. A1c and LDL at goal. Repeat CVA likely in the setting of hypertension and medication non-adherence given that she likely has baseline vascular dementia and lives alone.  Unclear if she was taking her medications.  - Neurology was consulted, appreciate recs:  - Continue Aspirin  81 mg daily  - Continue Plavix  75 mg daily  - Continue Crestor  40 mg daily  - Continue Zetia  10 mg daily - Frequent neuro checks - Telemetry monitoring - Routine EEG - showing cortical dysfunction from left temporal region; no seizures seen -P2Y12 pending -PT recommending rollator with outpatient PT -OT without any recs  #Hypertension Patient hypertensive upon arrival with blood pressures up to 160s/80s. Will continue patient's home medications. - Continue home amlodipine  10 mg daily  #AKI #CKD3b Serum creatinine on admission of 2.21 which has improved to 1.80 today.  Likely prerenal in the setting of poor p.o. intake and acute illness with poor perfusion.  Will continue to monitor for resolution. - Trend BMPs  #NAGMA with respiratory compensation VBG from the ED showing decreased pCO2 of 26.2 with bicarb of 14.5.  Patient without any respiratory symptoms at this time.  This appears stable.  Not likely  because of acute altered mental status. - Continue to trend  #T2DM Hemoglobin A1c of 6.2 during this admission.  Prior value was 5.6 on 09/26/2023. Initial CBGs are in the 170-180s.  Will continue SSI with carb controlled diet. - SSI - Carb controlled diet - CBGs  #COPD #ILD No acute concerns for COPD exacerbation.  CT imaging showing signs of fibrotic interstitial lung disease which is consistent with history.  She received DuoNeb in the ED. - DuoNebs as needed - Albuterol  nebulizer as needed  #Hypoattenuated splenic structure Patient not currently reporting abdominal pain.  Nontender on exam.  This incidental finding is indeterminate could be benign versus malignant splenic lesions versus sequelae of splenic infarct.  Given the patient's history of rectal cancer in 2016, we will order an MRI of the abdomen with contrast in the outpatient setting for follow-up.  #Asymptomatic cholelithiasis Patient without abdominal pain.  CT imaging findings showing multiple gallstones largest measuring 5 mm without gallbladder wall thickening or pericholecystic inflammation.  Can consider outpatient follow-up if symptomatic.  #GERD - continue home pantoprazole  40mg  daily  #MDD Holding home Effexor  and Zoloft  in the setting of altered mental status.  Can restart upon discharge.  Best Practice: Diet: Carb-Modified IVF: Fluids: None currently VTE: heparin  injection 5,000 Units Start: 02/23/24 1630 SCDs Start: 02/23/24 0859 Code: DNR  Disposition planning: Therapy Recs: Pending, DME: pending Family Contact: Sister, on phone during evaluation DISPO: Anticipated discharge to home pending safe transportation.  Signature:  Letha Cheadle, MD [facility]  PGY-1 02/24/2024, 12:46 PM  On Call pager [phone]

## 2024-02-23 NOTE — ED Notes (Addendum)
 Pt back from CT

## 2024-02-23 NOTE — Plan of Care (Signed)
  Problem: Education: Goal: Ability to describe self-care measures that may prevent or decrease complications (Diabetes Survival Skills Education) will improve Outcome: Progressing Goal: Individualized Educational Video(s) Outcome: Progressing   Problem: Coping: Goal: Ability to adjust to condition or change in health will improve Outcome: Progressing   Problem: Health Behavior/Discharge Planning: Goal: Ability to identify and utilize available resources and services will improve Outcome: Progressing Goal: Ability to manage health-related needs will improve Outcome: Progressing   Problem: Metabolic: Goal: Ability to maintain appropriate glucose levels will improve Outcome: Progressing   Problem: Education: Goal: Knowledge of disease or condition will improve Outcome: Progressing Goal: Knowledge of secondary prevention will improve (MUST DOCUMENT ALL) Outcome: Progressing Goal: Knowledge of patient specific risk factors will improve (DELETE if not current risk factor) Outcome: Progressing   Problem: Tissue Perfusion: Goal: Adequacy of tissue perfusion will improve Outcome: Progressing   Problem: Health Behavior/Discharge Planning: Goal: Ability to manage health-related needs will improve Outcome: Progressing Goal: Goals will be collaboratively established with patient/family Outcome: Progressing   Problem: Education: Goal: Knowledge of General Education information will improve Description: Including pain rating scale, medication(s)/side effects and non-pharmacologic comfort measures Outcome: Progressing   Problem: Activity: Goal: Risk for activity intolerance will decrease Outcome: Progressing

## 2024-02-24 ENCOUNTER — Observation Stay (HOSPITAL_COMMUNITY)

## 2024-02-24 ENCOUNTER — Other Ambulatory Visit (HOSPITAL_COMMUNITY): Payer: Self-pay

## 2024-02-24 DIAGNOSIS — R4182 Altered mental status, unspecified: Secondary | ICD-10-CM | POA: Diagnosis not present

## 2024-02-24 DIAGNOSIS — I639 Cerebral infarction, unspecified: Secondary | ICD-10-CM | POA: Diagnosis not present

## 2024-02-24 DIAGNOSIS — Z85048 Personal history of other malignant neoplasm of rectum, rectosigmoid junction, and anus: Secondary | ICD-10-CM

## 2024-02-24 DIAGNOSIS — I129 Hypertensive chronic kidney disease with stage 1 through stage 4 chronic kidney disease, or unspecified chronic kidney disease: Secondary | ICD-10-CM

## 2024-02-24 DIAGNOSIS — Z7902 Long term (current) use of antithrombotics/antiplatelets: Secondary | ICD-10-CM | POA: Diagnosis not present

## 2024-02-24 DIAGNOSIS — I63541 Cerebral infarction due to unspecified occlusion or stenosis of right cerebellar artery: Secondary | ICD-10-CM | POA: Diagnosis not present

## 2024-02-24 DIAGNOSIS — I6389 Other cerebral infarction: Secondary | ICD-10-CM | POA: Diagnosis not present

## 2024-02-24 DIAGNOSIS — E1151 Type 2 diabetes mellitus with diabetic peripheral angiopathy without gangrene: Secondary | ICD-10-CM | POA: Diagnosis not present

## 2024-02-24 DIAGNOSIS — E1122 Type 2 diabetes mellitus with diabetic chronic kidney disease: Secondary | ICD-10-CM | POA: Diagnosis not present

## 2024-02-24 DIAGNOSIS — D739 Disease of spleen, unspecified: Secondary | ICD-10-CM

## 2024-02-24 DIAGNOSIS — N1832 Chronic kidney disease, stage 3b: Secondary | ICD-10-CM

## 2024-02-24 LAB — VITAMIN B12: Vitamin B-12: 848 pg/mL (ref 180–914)

## 2024-02-24 LAB — COMPREHENSIVE METABOLIC PANEL WITH GFR
ALT: 15 U/L (ref 0–44)
AST: 18 U/L (ref 15–41)
Albumin: 3.6 g/dL (ref 3.5–5.0)
Alkaline Phosphatase: 80 U/L (ref 38–126)
Anion gap: 10 (ref 5–15)
BUN: 34 mg/dL — ABNORMAL HIGH (ref 8–23)
CO2: 16 mmol/L — ABNORMAL LOW (ref 22–32)
Calcium: 8.9 mg/dL (ref 8.9–10.3)
Chloride: 114 mmol/L — ABNORMAL HIGH (ref 98–111)
Creatinine, Ser: 1.8 mg/dL — ABNORMAL HIGH (ref 0.44–1.00)
GFR, Estimated: 30 mL/min — ABNORMAL LOW (ref >60)
Glucose, Bld: 179 mg/dL — ABNORMAL HIGH (ref 70–99)
Potassium: 4.4 mmol/L (ref 3.5–5.1)
Sodium: 140 mmol/L (ref 135–145)
Total Bilirubin: 0.8 mg/dL (ref 0.0–1.2)
Total Protein: 6.9 g/dL (ref 6.5–8.1)

## 2024-02-24 LAB — AMMONIA: Ammonia: 29 umol/L (ref 9–35)

## 2024-02-24 LAB — CBC
HCT: 35.9 % — ABNORMAL LOW (ref 36.0–46.0)
Hemoglobin: 12.8 g/dL (ref 12.0–15.0)
MCH: 31.8 pg (ref 26.0–34.0)
MCHC: 35.7 g/dL (ref 30.0–36.0)
MCV: 89.1 fL (ref 80.0–100.0)
Platelets: 151 K/uL (ref 150–400)
RBC: 4.03 MIL/uL (ref 3.87–5.11)
RDW: 15.9 % — ABNORMAL HIGH (ref 11.5–15.5)
WBC: 3.8 K/uL — ABNORMAL LOW (ref 4.0–10.5)
nRBC: 0 % (ref 0.0–0.2)

## 2024-02-24 LAB — LIPID PANEL
Cholesterol: 103 mg/dL (ref 0–200)
HDL: 61 mg/dL (ref >40)
LDL Cholesterol: 28 mg/dL (ref 0–99)
Total CHOL/HDL Ratio: 1.7 ratio
Triglycerides: 68 mg/dL (ref <150)
VLDL: 14 mg/dL (ref 0–40)

## 2024-02-24 LAB — GLUCOSE, CAPILLARY
Glucose-Capillary: 170 mg/dL — ABNORMAL HIGH (ref 70–99)
Glucose-Capillary: 231 mg/dL — ABNORMAL HIGH (ref 70–99)
Glucose-Capillary: 343 mg/dL — ABNORMAL HIGH (ref 70–99)
Glucose-Capillary: 280 mg/dL — ABNORMAL HIGH (ref 70–99)
Glucose-Capillary: 188 mg/dL — ABNORMAL HIGH (ref 70–99)

## 2024-02-24 LAB — TSH: TSH: 3.332 u[IU]/mL (ref 0.350–4.500)

## 2024-02-24 LAB — HEMOGLOBIN A1C
Hgb A1c MFr Bld: 6.2 % — ABNORMAL HIGH (ref 4.8–5.6)
Mean Plasma Glucose: 131.24 mg/dL

## 2024-02-24 LAB — MAGNESIUM: Magnesium: 2.1 mg/dL (ref 1.7–2.4)

## 2024-02-24 MED ORDER — DICLOFENAC SODIUM 1 % EX GEL
4.0000 g | Freq: Two times a day (BID) | CUTANEOUS | Status: DC | PRN
  Administered 2024-02-24: 4 g via TOPICAL
  Filled 2024-02-24: qty 100

## 2024-02-24 MED ORDER — CILOSTAZOL 100 MG PO TABS
100.0000 mg | ORAL_TABLET | Freq: Two times a day (BID) | ORAL | Status: DC
  Administered 2024-02-24 – 2024-02-25 (×2): 100 mg via ORAL
  Filled 2024-02-24 (×3): qty 1

## 2024-02-24 MED ORDER — CILOSTAZOL 100 MG PO TABS
100.0000 mg | ORAL_TABLET | Freq: Two times a day (BID) | ORAL | 0 refills | Status: DC
  Filled 2024-02-24: qty 60, 30d supply, fill #0

## 2024-02-24 MED ORDER — IOHEXOL 350 MG/ML SOLN
75.0000 mL | Freq: Once | INTRAVENOUS | Status: AC | PRN
  Administered 2024-02-24: 75 mL via INTRAVENOUS

## 2024-02-24 NOTE — Progress Notes (Addendum)
 Evaluated patient at bedside for severe left leg cramping. Patient reports similar leg cramps at home that resolve after time without medication intervention. Since the RN massaged her leg and placed the SCDs, the L leg cramping has resolved.   On exam, there is no edema of the LLE, no tenderness to deep palpation of the gastrocnemius. 3/5 LLE motor strength.   Plan: -Check magnesium, potassium WNL -Continue SCDs

## 2024-02-24 NOTE — Progress Notes (Addendum)
 STROKE TEAM PROGRESS NOTE   SUBJECTIVE (INTERVAL HISTORY) No family is at the bedside. Last night event noted. Pt reported she got up to bathroom and feeling hot all over and then she felt a little weaker on the left arm along with mild HA. Exam found some functional component. CT, CTA and MRI negative for acute finding.    OBJECTIVE Temp:  [97.5 F (36.4 C)-98.7 F (37.1 C)] 97.5 F (36.4 C) (09/13 0759) Pulse Rate:  [65-69] 65 (09/13 0432) Cardiac Rhythm: Normal sinus rhythm (09/13 0700) Resp:  [17-20] 18 (09/12 2035) BP: (131-166)/(70-96) 166/96 (09/13 0616) SpO2:  [100 %] 100 % (09/13 0759)  Recent Labs  Lab 02/23/24 1215 02/23/24 1737 02/23/24 2112 02/24/24 0604 02/24/24 1137  GLUCAP 188* 163* 192* 170* 231*   Recent Labs  Lab 02/22/24 2250 02/22/24 2305 02/23/24 0749 02/24/24 0546  NA 136 139 140 140  K 4.9 5.1 4.8 4.4  CL 111 115*  --  114*  CO2 16*  --   --  16*  GLUCOSE 189* 185*  --  179*  BUN 47* 44*  --  34*  CREATININE 2.21* 2.20*  --  1.80*  CALCIUM  8.9  --   --  8.9   Recent Labs  Lab 02/22/24 2250 02/24/24 0546  AST 19 18  ALT 15 15  ALKPHOS 92 80  BILITOT 1.1 0.8  PROT 7.2 6.9  ALBUMIN 3.7 3.6   Recent Labs  Lab 02/22/24 2250 02/22/24 2305 02/23/24 0749 02/24/24 0546  WBC 7.0  --   --  3.8*  NEUTROABS 5.0  --   --   --   HGB 13.8 13.3 11.2* 12.8  HCT 38.6 39.0 33.0* 35.9*  MCV 90.0  --   --  89.1  PLT 166  --   --  151   No results for input(s): CKTOTAL, CKMB, CKMBINDEX, TROPONINI in the last 168 hours. Recent Labs    02/23/24 2021  LABPROT 15.6*  INR 1.2   Recent Labs    02/23/24 0208  COLORURINE STRAW*  LABSPEC 1.010  PHURINE 5.0  GLUCOSEU >=500*  HGBUR NEGATIVE  BILIRUBINUR NEGATIVE  KETONESUR NEGATIVE  PROTEINUR 30*  NITRITE NEGATIVE  LEUKOCYTESUR NEGATIVE       Component Value Date/Time   CHOL 103 02/24/2024 0546   CHOL 132 03/08/2021 1143   TRIG 68 02/24/2024 0546   HDL 61 02/24/2024 0546   HDL  76 03/08/2021 1143   CHOLHDL 1.7 02/24/2024 0546   VLDL 14 02/24/2024 0546   LDLCALC 28 02/24/2024 0546   LDLCALC 31 07/16/2021 0000   Lab Results  Component Value Date   HGBA1C 6.2 (H) 02/24/2024      Component Value Date/Time   LABOPIA NONE DETECTED 03/18/2023 1635   LABOPIA NONE DETECTED 06/26/2020 0643   COCAINSCRNUR NONE DETECTED 03/18/2023 1635   LABBENZ NONE DETECTED 03/18/2023 1635   LABBENZ NONE DETECTED 06/26/2020 0643   AMPHETMU NONE DETECTED 03/18/2023 1635   AMPHETMU NONE DETECTED 06/26/2020 0643   THCU NONE DETECTED 03/18/2023 1635   THCU NONE DETECTED 06/26/2020 0643   LABBARB NONE DETECTED 03/18/2023 1635   LABBARB NONE DETECTED 06/26/2020 0643    Recent Labs  Lab 02/23/24 2021  ETH <15    I have personally reviewed the radiological images below and agree with the radiology interpretations.  MR ANGIO HEAD WO CONTRAST Result Date: 02/23/2024 CLINICAL DATA:  Follow-up examination for acute stroke. EXAM: MRA HEAD WITHOUT CONTRAST TECHNIQUE: Angiographic images of the Circle  of Willis were acquired using MRA technique without intravenous contrast. COMPARISON:  Comparison with brain MRI from earlier the same day as well as prior CTA from 09/25/2023. FINDINGS: Anterior circulation: Both internal carotid arteries are patent through the siphons without stenosis or other abnormality. A1 segments patent bilaterally. Normal anterior communicating artery complex. Minor atheromatous irregularity about the ACAs without significant stenosis. No M1 stenosis or occlusion. No proximal MCA branch occlusion. Mild atheromatous irregularity about the distal MCA branches without high-grade stenosis. Posterior circulation: Both V4 segments patent without stenosis. Both PICA patent. Focal fenestration at the vertebrobasilar junction noted. Basilar widely patent without stenosis. Superior cerebellar arteries patent bilaterally. Both PCAs supplied via the basilar. Minimal atheromatous  irregularity about the PCAs without significant stenosis. Anatomic variants: None significant. Other: No intracranial aneurysm. IMPRESSION: 1. Negative intracranial MRA for large vessel occlusion. 2. Mild intracranial atherosclerotic disease without hemodynamically significant or correctable stenosis. Electronically Signed   By: Morene Hoard M.D.   On: 02/23/2024 19:18   VAS US  CAROTID Result Date: 02/23/2024 Carotid Arterial Duplex Study Patient Name:  BYRD TERRERO  Date of Exam:   02/23/2024 Medical Rec #: [id number]     Accession #:    [id number] Date of Birth: Apr 08, 1956     Patient Gender: F Patient Age:   68 years Exam Location:  [facility] Procedure:      VAS US  CAROTID Referring Phys: ARY CUMMINS --------------------------------------------------------------------------------  Indications:       CVA. Risk Factors:      Hypertension, hyperlipidemia, Diabetes, past history of                    smoking, prior CVA. Other Factors:     CKD. Comparison Study:  Previous exam on 03/05/2023 BIL ICA <50% Performing Technologist: Ezzie Potters RVT, RDMS  Examination Guidelines: A complete evaluation includes B-mode imaging, spectral Doppler, color Doppler, and power Doppler as needed of all accessible portions of each vessel. Bilateral testing is considered an integral part of a complete examination. Limited examinations for reoccurring indications may be performed as noted.  Right Carotid Findings: +----------+--------+--------+--------+------------------+------------------+           PSV cm/sEDV cm/sStenosisPlaque DescriptionComments           +----------+--------+--------+--------+------------------+------------------+ CCA Prox  61      9                                                    +----------+--------+--------+--------+------------------+------------------+ CCA Distal34      10                                intimal thickening  +----------+--------+--------+--------+------------------+------------------+ ICA Prox  41      15                                                   +----------+--------+--------+--------+------------------+------------------+ ICA Mid  tortuous           +----------+--------+--------+--------+------------------+------------------+ ICA Distal93      29                                tortuous           +----------+--------+--------+--------+------------------+------------------+ ECA       25      4                                                    +----------+--------+--------+--------+------------------+------------------+ +----------+--------+-------+----------------+-------------------+           PSV cm/sEDV cmsDescribe        Arm Pressure (mmHG) +----------+--------+-------+----------------+-------------------+ [id number]             Multiphasic, WNL                    +----------+--------+-------+----------------+-------------------+ +---------+--------+--+--------+--+---------+ VertebralPSV cm/s40EDV cm/s10Antegrade +---------+--------+--+--------+--+---------+  Left Carotid Findings: +----------+--------+--------+--------+------------------+------------------+           PSV cm/sEDV cm/sStenosisPlaque DescriptionComments           +----------+--------+--------+--------+------------------+------------------+ CCA Prox  43      11                                intimal thickening +----------+--------+--------+--------+------------------+------------------+ CCA Distal46      12                                intimal thickening +----------+--------+--------+--------+------------------+------------------+ ICA Prox  37      14              calcific and focal                   +----------+--------+--------+--------+------------------+------------------+ ICA Mid                                              tortuous           +----------+--------+--------+--------+------------------+------------------+ ICA Distal34      13                                tortuous           +----------+--------+--------+--------+------------------+------------------+ ECA       27      6                                                    +----------+--------+--------+--------+------------------+------------------+ +----------+--------+--------+----------------+-------------------+           PSV cm/sEDV cm/sDescribe        Arm Pressure (mmHG) +----------+--------+--------+----------------+-------------------+ [id number]              Multiphasic, WNL                    +----------+--------+--------+----------------+-------------------+ +---------+--------+--+--------+--+---------+ VertebralPSV cm/s34EDV cm/s12Antegrade +---------+--------+--+--------+--+---------+   Summary:  Right Carotid: The extracranial vessels were near-normal with only minimal wall                thickening or plaque. Left Carotid: The extracranial vessels were near-normal with only minimal wall               thickening or plaque. Vertebrals:  Bilateral vertebral arteries demonstrate antegrade flow. Subclavians: Normal flow hemodynamics were seen in bilateral subclavian              arteries. *See table(s) above for measurements and observations.  Electronically signed by Ary Cummins MD on 02/23/2024 at 6:20:45 PM.    Final    EEG adult Result Date: 02/23/2024 Shel[place] Arlin KIDD, MD     02/23/2024  4:22 PM Patient Name: IDANIA DESOUZA MRN: [id number] Epilepsy Attending: Arlin KIDD [place] Referring Physician/Provider: Cummins Ary, MD Date: 02/23/2024 Duration: 29.24 mins Patient history: 68 y.o. female with PMH of hypertension, diabetes, HLD, COPD, recurrent strokes, CKD3b admitted for confusion and not able to speak well.  EEG to evaluate for seizure Level of alertness: Awake, asleep AEDs during EEG study: None Technical aspects:  This EEG study was done with scalp electrodes positioned according to the 10-20 International system of electrode placement. Electrical activity was reviewed with band pass filter of 1-70Hz , sensitivity of 7 uV/mm, display speed of 70mm/sec with a 60Hz  notched filter applied as appropriate. EEG data were recorded continuously and digitally stored.  Video monitoring was available and reviewed as appropriate. Description: The posterior dominant rhythm consists of 8-9 Hz activity of moderate voltage (25-35 uV) seen predominantly in posterior head regions, symmetric and reactive to eye opening and eye closing. Sleep was characterized by vertex waves, sleep spindles (12 to 14 Hz), maximal frontocentral region. EEG showed intermittent 3 to 5 Hz theta-delta slowing in left temporal region. Hyperventilation and photic stimulation were not performed.    ABNORMALITY - Intermittent slow, left temporal region  IMPRESSION: This study is suggestive of cortical dysfunction arising from left temporal region. No seizures or definite epileptiform discharges were seen throughout the recording.  Please note lack of epileptiform abnormality during interictal EEG does not exclude the diagnosis of epilepsy.   Arlin KIDD [place]   MR BRAIN WO CONTRAST Result Date: 02/23/2024 CLINICAL DATA:  68 year old female neurologic deficit. Respiratory distress. EXAM: MRI HEAD WITHOUT CONTRAST TECHNIQUE: Multiplanar, multiecho pulse sequences of the brain and surrounding structures were obtained without intravenous contrast. COMPARISON:  Head CT yesterday.  Brain MRI 09/25/2023. FINDINGS: Brain: Chronic infarcts and encephalomalacia: -Small chronic and patchy bilateral cerebellar infarcts. Multiple chronic lacunar infarcts in the bilateral thalami. Bilateral PCA territory encephalomalacia affecting the parietal and occipital lobes. Similar cortical and subcortical white matter encephalomalacia at the bilateral superior frontal gyri. Additional  periventricular white matter signal changes and multifocal discrete involvement of the corpus callosum. Comparatively mild to moderate chronic T2 heterogeneity in the pons. Superimposed punctate cortical infarct in the anterior right middle or superior frontal gyrus series 3, image 38. Punctate infarct also in the central right thalamus (series 3, image 25). No other convincing diffusion restriction. But chronic microhemorrhage in the left thalamus is new since April. Chronic hemosiderin in the deep right cerebellar nuclei appears stable. No midline shift, mass effect, evidence of mass lesion, ventriculomegaly, extra-axial collection or acute intracranial hemorrhage. Cervicomedullary junction and pituitary are within normal limits. Vascular: Major intracranial vascular flow voids are stable since April. Skull and upper cervical spine: Normal background bone marrow signal. Age appropriate visible cervical spine  degeneration. Sinuses/Orbits: Stable, negative. Other: Mastoids remain clear.  Negative visible scalp and face IMPRESSION: 1. Acute on chronic small vessel disease in the form of Punctate lacunar infarcts in both the right thalamus and the right frontal lobe. No associated hemorrhage or mass effect. 2. Underlying advanced chronic ischemic disease, largely stable since April this year although new chronic microhemorrhage in the left thalamus since that time. Electronically Signed   By: VEAR Hurst M.D.   On: 02/23/2024 09:53   CT CHEST ABDOMEN PELVIS WO CONTRAST Result Date: 02/23/2024 EXAM: CT CHEST, ABDOMEN AND PELVIS WITHOUT CONTRAST 02/23/2024 04:26:54 AM TECHNIQUE: CT of the chest, abdomen and pelvis was performed without the administration of intravenous contrast. Multiplanar reformatted images are provided for review. Automated exposure control, iterative reconstruction, and/or weight based adjustment of the mA/kV was utilized to reduce the radiation dose to as low as reasonably achievable. COMPARISON: CT  chest from 03/07/2022 and CT abdomen and pelvis from 08/17/2016. CLINICAL HISTORY: Respiratory distress. FINDINGS: CHEST: MEDIASTINUM AND LYMPH NODES: Heart size is at the upper limits of normal. Coronary artery calcifications are present. Loop recorder identified within the left ventral chest wall. No mediastinal, hilar or axillary lymphadenopathy. LUNGS AND PLEURA: Signs of fibrotic interstitial lung disease with bilateral peripheral predominant interstitial reticulation, traction bronchiectasis and subpleural honeycombing. 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 No suspicious lung nodules. No pleural fluid, interstitial edema or pneumothorax. ABDOMEN AND PELVIS: LIVER: The liver is unremarkable. GALLBLADDER AND BILE DUCTS: Multiple gallstones are identified which measure up to 5 mm, image 54/3. No CT findings of gallbladder wall thickening or pericholecystic inflammation. No bile duct dilatation. SPLEEN: Peripheral low attenuation structure within the spleen measures 1.3 cm, image 46/3. Not seen on previous exams. PANCREAS: No pancreatic inflammation, main duct dilatation or mass. ADRENAL GLANDS: Normal appearance of the adrenal glands. KIDNEYS, URETERS AND BLADDER: No stones in the kidneys or ureters. No hydronephrosis. No perinephric or periureteral stranding. Urinary bladder is unremarkable. GI AND BOWEL: The appendix is visualized and appears normal. Colonic diverticula identified without signs of acute diverticulitis. No abnormal bowel dilatation or inflammation. Signs of previous distal colonic resection with reanastomosis at the level of the rectum, image 102/3. REPRODUCTIVE ORGANS: The uterus appears normal. No adnexal mass. PERITONEUM AND RETROPERITONEUM: No free fluid or fluid collections within the abdomen or pelvis. No free air. VASCULATURE: Mild aortic atherosclerotic calcification. ABDOMINAL AND PELVIS LYMPH NODES: No lymphadenopathy. BONES AND SOFT TISSUES:  L5-S1 degenerative disc disease. No acute osseous abnormality. No focal soft tissue abnormality. IMPRESSION: 1. 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 2. Multiple gallstones, the largest measuring 5 mm, without CT findings of gallbladder wall thickening or pericholecystic inflammation. 3. Peripheral low attenuation structure within the spleen measuring 1.3 cm, not seen on previous exams. This is an indeterminate finding with differential considerations including benign and malignant splenic lesions as well as sequelae of the splenic infarct. Solitary splenic lesions are typically benign. However, in a patient with a known malignancy, consider follow-up imaging with non-emergent contrast-enhanced MRI of the abdomen. Electronically signed by: Waddell Calk MD 02/23/2024 06:10 AM EDT RP Workstation: [id number]   DG Chest Portable 1 View Result Date: 02/23/2024 EXAM: 1 VIEW XRAY OF THE CHEST 02/23/2024 12:33:47 AM COMPARISON: 03/18/2023 CLINICAL HISTORY: SOB. FINDINGS: LUNGS AND PLEURA: Diffuse interstitial coarsening bilaterally greater on the left. No focal pulmonary opacity. No pulmonary edema. No  pleural effusion. No pneumothorax. HEART AND MEDIASTINUM: Stable cardiomediastinal silhouette. Loop recorder. No acute abnormality of the cardiac and mediastinal silhouettes. BONES AND SOFT TISSUES: No acute osseous abnormality. IMPRESSION: 1. Diffuse interstitial coarsening bilaterally, greater on the left. This is compatible with interstitial lung disease. Superimposed infection or edema is difficult to exclude. Electronically signed by: Norman Gatlin MD 02/23/2024 12:40 AM EDT RP Workstation: [id number]   CT HEAD WO CONTRAST Result Date: 02/22/2024 EXAM: CT HEAD WITHOUT CONTRAST 02/22/2024 11:35:20 PM TECHNIQUE: CT of the head  was performed without the administration of intravenous contrast. Automated exposure control, iterative reconstruction, and/or weight based adjustment of the mA/kV was utilized to reduce the radiation dose to as low as reasonably achievable. COMPARISON: CT and MRI 09/25/2023 CLINICAL HISTORY: Neuro deficit, acute, stroke suspected. Stroke suspected; Hx of several strokes. FINDINGS: BRAIN AND VENTRICLES: No acute hemorrhage. No evidence of acute infarct. No hydrocephalus. No extra-axial collection. No mass effect or midline shift. Chronic microvascular ischemic changes and generalized atrophy. Small chronic infarcts in the bilateral cerebellar hemispheres. Chronic lacunar infarcts in the thalamus and caudate bilaterally. ORBITS: No acute abnormality. SINUSES: Chronic sphenoid sinusitis. SOFT TISSUES AND SKULL: No acute soft tissue abnormality. No skull fracture. IMPRESSION: 1. No acute intracranial abnormality. 2. Similar microvascular ischemic change and chronic infarcts. Electronically signed by: Norman Gatlin MD 02/22/2024 11:43 PM EDT RP Workstation: [id number]   CUP PACEART REMOTE DEVICE CHECK Result Date: 02/20/2024 ILR summary report received. Battery status OK. Normal device function. No new symptom, tachy, brady, or pause episodes. Up to 20 new AF episodes per day per trends, longest 1 hr 44 min on 02/05/24 at 9:03 pm, EGMs c/w false detection due to SR with PAC and PVC ectopy, undersensing and oversensing; no true atrial arrhythmia appreciated. Monthly summary reports and ROV/PRN. MC, CVRS    PHYSICAL EXAM  Temp:  [97.5 F (36.4 C)-98.7 F (37.1 C)] 97.5 F (36.4 C) (09/13 0759) Pulse Rate:  [65-69] 65 (09/13 0432) Resp:  [17-20] 18 (09/12 2035) BP: (131-166)/(70-96) 166/96 (09/13 0616) SpO2:  [100 %] 100 % (09/13 0759)  General - well nourished, well developed, in no apparent distress.     Ophthalmologic - fundi not visualized due to noncooperation.     Cardiovascular - regular rhythm  and rate   Neuro - awake, alert, eyes open, orientated to age, place, time and situation.  Mild hesitation of speech, psychomotor slowing, but following all simple commands. Able to name and repeat in dysarthric voice. No gaze palsy, tracking bilaterally, visual field full. No significant facial droop. Tongue midline.  Right upper and lower extremity 4/5, left upper extremity 3 -/5 and left lower extremity 2+/5. Sensation mildly decreased on the left, right FTN intact, left finger-to-nose ataxic not out of proportion to the weakness, gait not tested.    ASSESSMENT/PLAN Ms. LACRESIA DARWISH is a 68 y.o. female with history of hypertension, diabetes, hyperlipidemia, COPD, recurrent strokes, CKD 3B admitted for confusion and speech difficulty. No TNK given due to outside window.    Stroke: 3 small infarcts involving right cerebellum, thalamus and frontal cortex, etiology unclear, small vessel disease vs cardioembolic source CT no acute abnormality, chronic lacunar infarct in the thalamus and caudate bilaterally. MRI acute infarcts in both the right thalamus and right frontal lobe MRA negative for LVO, mild intracranial atherosclerosis CTA head and neck Mild for age atheromatous change about the carotid siphons without hemodynamically significant stenosis. Carotid Doppler unremarkable MRI repeat limited - stable MRI with punctate acute ischemic  infarcts involving the right thalamus, high anterior right frontal lobe, and lateral right cerebellum Loop recorder interrogation no real A-fib 2D Echo EF 55 to 60% in 09/2023 LDL 28 HgbA1c 6.2 P2 Y12 not able to perform in [facility] Heparin  subcu for VTE prophylaxis clopidogrel  75 mg daily prior to admission, now on cilostazol  100 mg twice daily and Plavix  75 mg daily based on [email or web address] trial.  Continue on discharge. Patient counseled to be compliant with her antithrombotic medications Ongoing aggressive stroke risk factor management Therapy recommendations:  outpt  PT Disposition:  pending  History of stroke 06/2020 admitted for right caudate head and right lateral thalamus infarcts.  MRI and carotid Doppler negative.  EF 55 to 60%.  LDL 86, A1c 5.2.  Recommend 30-day CardioNet monitor.  Discharged on DAPT and Lipitor 40. 10/2020 admitted for right small parietal occipital infarct.  LDL 56, A1c 5.2.  Loop recorder placed.  Discharged on DAPT. 02/2023 admitted for bilateral CR small infarcts.  Loop recorder interrogation no in A-fib.  Discharged on DAPT 03/2023 admitted for syncope and found to have right-sided weakness.  MRI showed left thalamic infarct.  Discharged on DAPT and Crestor  06/2023 hypercoag workup negative with oncology 09/2023 admitted for right sided numbness, difficulty walking and altered mental status.  CT no acute abnormality.  CT head and neck unremarkable.  MRI shows small left thalamic infarct.  EF 55 to 60%.  Loop recorder no A-fib.  LDL 78, A1c 5.6.  P2 Y12 = 161, put on DAPT for 3 weeks and then Plavix  alone.  Continue Crestor  40 and add Zetia .  Diabetes HgbA1c 6.2 goal < 7.0 Controlled CBG monitoring SSI DM education and close PCP follow up  Hypertension Stable Avoid low BP Long term BP goal normotensive  Hyperlipidemia Home meds: Crestor  40 and Zetia  10 LDL 28, goal < 70 Now on home Crestor  40 and Zetia  10 Continue statin at discharge  Other Stroke Risk Factors Advanced age  Other Active Problems COPD CKD 3B, creatinine 2.21--1.80  Hospital day # 0  Neurology will sign off. Please call with questions. Pt will follow up with stroke clinic NP Jessica at [facility] on 04/04/2024. Thanks for the consult.   Ary Cummins, MD PhD Stroke Neurology 02/24/2024 11:43 AM    To contact Stroke Continuity provider, please refer to [email or web address]. After hours, contact General Neurology

## 2024-02-24 NOTE — Progress Notes (Signed)
 OT Screen Note  Patient Details Name: Erica [name] MRN: [id number] DOB: 12-29-1955   Cancelled Treatment:    Reason Eval/Treat Not Completed: OT screened, no needs identified, will sign off (Had discussion with providing PT and then had thorough discussion with pt regading her level of function. Pt has nursing aide assist PRN with ADLs, prepackaged meds, reports being at baseilne function with UEs from prior CVAs. Pt also starting outpt OT.) Acute OT will sign off as pt still at baseline, pt to continue to progress with her Outpatient therapy services once she starts. OT signing off.   02/24/2024  AB, OTR/L  Acute Rehabilitation Services  Office: [phone]   [name] 02/24/2024, 9:15 AM

## 2024-02-24 NOTE — Discharge Summary (Incomplete)
 Name: Erica [name] MRN: [id number] DOB: 1955/10/07 68 y.o. PCP: Delores Rojelio Caldron, NP  Date of Admission: 02/22/2024 10:11 PM Date of Discharge: 02/25/2024 Attending Physician: Dr. Eben   Discharge Diagnosis: Principal Problem:   Acute alteration in mental status Active Problems:   Interstitial lung disease (HCC)   COPD (chronic obstructive pulmonary disease) (HCC)   GERD (gastroesophageal reflux disease)   MDD (major depressive disorder), recurrent severe, without psychosis (HCC)   Acute CVA (cerebrovascular accident) (HCC)   Chronic kidney disease, stage 3b (HCC)   History of CVA with residual deficit   Type 2 diabetes mellitus with hypoglycemia, with long-term current use of insulin  [facility])    Discharge Medications: Allergies as of 02/25/2024       Reactions   Penicillins Hives, Itching, Swelling   Tongue swelling        Medication List     TAKE these medications    acetaminophen  325 MG tablet Commonly known as: TYLENOL  Take 2 tablets (650 mg total) by mouth every 4 (four) hours as needed for mild pain (pain score 1-3) (or temp > 37.5 C (99.5 F)).   albuterol  108 (90 Base) MCG/ACT inhaler Commonly known as: VENTOLIN  HFA Inhale 2 puffs into the lungs every 4 (four) hours as needed for wheezing.   amLODipine  10 MG tablet Commonly known as: NORVASC  Take 1 tablet (10 mg total) by mouth daily.   benzoyl peroxide-erythromycin gel Commonly known as: BENZAMYCIN Apply 1 Application topically every other day.   budesonide -formoterol  160-4.5 MCG/ACT inhaler Commonly known as: SYMBICORT  Inhale 2 puffs into the lungs 2 (two) times daily as needed (Asthma).   cilostazol  100 MG tablet Commonly known as: PLETAL  Take 1 tablet (100 mg total) by mouth 2 (two) times daily.   clindamycin  1 % lotion Commonly known as: CLEOCIN  T Apply 1 Application topically daily as needed (Irritation/Acne).   clopidogrel  75 MG tablet Commonly known as: PLAVIX  Take 1 tablet (75 mg  total) by mouth daily.   dapagliflozin  propanediol 10 MG Tabs tablet Commonly known as: FARXIGA  Take 1 tablet (10 mg total) by mouth daily.   diclofenac  Sodium 1 % Gel Commonly known as: VOLTAREN  Apply 1 Application topically 2 (two) times daily as needed (pain).   ezetimibe  10 MG tablet Commonly known as: ZETIA  Take 1 tablet (10 mg total) by mouth daily.   gabapentin  100 MG capsule Commonly known as: NEURONTIN  Take 1 capsule (100 mg total) by mouth 3 (three) times daily. What changed:  when to take this reasons to take this   hydrALAZINE  25 MG tablet Commonly known as: APRESOLINE  Take 37.5 mg by mouth 3 (three) times daily.   insulin  glargine 100 UNIT/ML Solostar Pen Commonly known as: LANTUS  Inject 10 Units into the skin daily. Pen expires 28 days after first use What changed:  how much to take additional instructions   metroNIDAZOLE  0.75 % cream Commonly known as: METROCREAM  Apply 1 Application topically every other day.   naphazoline-glycerin  0.012-0.25 % Soln Commonly known as: CLEAR EYES REDNESS Place 1-2 drops into both eyes 3 (three) times daily with meals. What changed:  how much to take when to take this reasons to take this   nystatin  100000 UNIT/ML suspension Commonly known as: MYCOSTATIN  Take 5 mLs by mouth in the morning, at noon, and at bedtime.   pantoprazole  40 MG tablet Commonly known as: PROTONIX  Take 1 tablet (40 mg total) by mouth daily.   Pirfenidone  801 MG Tabs TAKE 1 TABLET (801 MG) BY  MOUTH WITH BREAKFAST, WITH LUNCH, AND WITH EVENING MEAL.   rosuvastatin  40 MG tablet Commonly known as: CRESTOR  Take 1 tablet (40 mg total) by mouth daily.   sertraline  50 MG tablet Commonly known as: ZOLOFT  Take 1 tablet (50 mg total) by mouth daily.   topiramate  25 MG tablet Commonly known as: TOPAMAX  Take 1 tablet (25 mg total) by mouth 2 (two) times daily.   venlafaxine  XR 150 MG 24 hr capsule Commonly known as: EFFEXOR -XR Take 1 capsule  (150 mg total) by mouth daily.               Durable Medical Equipment  (From admission, onward)           Start     Ordered   02/24/24 1223  For home use only DME 4 wheeled rolling walker with seat  Once       Question:  Patient needs a walker to treat with the following condition  Answer:  Stroke [facility])   02/24/24 1222            Disposition and follow-up:   Ms.Malynn A Madole was discharged from [facility] in Stable condition.  At the hospital follow up visit please address:  1.  Follow-up:  A) Acute on chronic ischemic CVA: Patient discharged on Cilostazol  and Plavix  as well as Crestor  and Zetia .  Continue to monitor secondary risk factors outpatient. Stopped Aspirin  at discharge   B) AKI:  Patient had AKI on admission.  Creatinine back to baseline at discharge.  Continue to monitor kidney function outpatient.  C) Hypoattenuated splenic structure: Patient found to have incidental finding of hypoattenuation on spleen.  Given history of rectal cancer, recommend outpatient MRI to further characterize.   2.  Labs / imaging needed at time of follow-up: MRI of abdomen, CBC, BMP  3.  Pending labs/ test needing follow-up: N/A  4.  Medication Changes  Stopped Aspirin  and started Cilostazol . Continued Plavix . No other medication changes   Follow-up Appointments:  Follow-up Information     Whitfield Raisin, NP. Go on 04/04/2024.   Specialty: Neurology Why: As scheduled, stroke clinic Contact information: [address] [place] [address] [phone]         Delores Rojelio Caldron, NP. Call in 1 day(s).   Specialty: Nurse Practitioner Why: Please call to make an appointment for hospital follow-up. Contact information: [address] [place] [address] [phone]         [facility]. Call in 1 day(s).   Specialty: Neurology Why: To make an appointment for hospital follow-up. Contact  information: [address] [place]  [phone]               Hospital Course by problem list: #Acute Ischemic CVA #History of Multiple Ischemic CVA Patient initially presented to the emergency department for concerns of altered mental status.  Patient had imaging that revealed acute on chronic infarcts.  Patient was admitted for secondary risk factor modifications.  A1c at goal, LDL at goal.  Patient was followed by neurology.  Stroke thought to be in the setting of missed medication doses.  Patient worked with PT/OT who recommended outpatient PT.  Patient did well during hospitalization.  On night before discharge, patient had headache which prompted MRI.  MRI did not show any new acute concerns.  Patient discharged on Cilostazol , Plavix , Crestor , and Zetia .  #Hypertension Continued home antihypertensive medication with amlodipine  10 mg  daily.  No acute concerns during hospitalization.   #AKI #CKD3b During hospitalization, patient found to have AKI with admission creatinine of 2.21.  Improved during hospitalization.    #NAGMA with respiratory compensation NAGMA likely secondary to chronic renal insufficiency.  No acute concerns during hospitalization.   #T2DM A1c during hospitalization found to be 6.2.  Diabetes is well-controlled.  During hospitalization patient was on sliding scale insulin .  At discharge, recommended diet modification.  No need for medications at this time.    #COPD #ILD No acute concerns for COPD exacerbation.  CT imaging showing signs of fibrotic interstitial lung disease which is consistent with history.     #Hypoattenuated splenic structure This incidental finding is indeterminate could be benign versus malignant splenic lesions versus sequelae of splenic infarct.  Given the patient's history of rectal cancer in 2016, we recommend outpatient MRI for further evaluation.   #Asymptomatic cholelithiasis Patient without  abdominal pain.  CT imaging findings showing multiple gallstones largest measuring 5 mm without gallbladder wall thickening or pericholecystic inflammation.  Can consider outpatient follow-up if symptomatic.   #GERD Continue home pantoprazole  during hospitalization.   #MDD Held Zoloft  and Effexor  during hospitalization.  Can resume at discharge.  Discharge Subjective: Patient evaluated at bedside this morning.  She feels better.  She has no concerns.  She states she is ready to go home.  Discharge Exam:   BP 134/80 (BP Location: Left Arm)   Pulse 76   Temp 98.2 F (36.8 C) (Oral)   Resp 17   Ht 5' 3 (1.6 m)   Wt 73 kg   SpO2 100%   BMI 28.51 kg/m  Constitutional: Resting bed, no acute distress HENT: normocephalic atraumatic Cardiovascular: regular rate and rhythm, no m/r/g Pulmonary/Chest: normal work of breathing on room air, lungs clear to auscultation bilaterally Abdominal: soft, non-tender, non-distended Neuro: Left upper extremity with 3/5 strength on shoulder abduction.  Grip strength 5 out of 5 to bilateral upper extremities.  Left lower extremity with 3/5 strength on hip flexion, right lower extremity with 5/5 strength on hip flexion.  Pertinent Labs, Studies, and Procedures:     Latest Ref Rng & Units 02/24/2024    5:46 AM 02/23/2024    7:49 AM 02/22/2024   11:05 PM  CBC  WBC 4.0 - 10.5 K/uL 3.8     Hemoglobin 12.0 - 15.0 g/dL 87.1  88.7  86.6   Hematocrit 36.0 - 46.0 % 35.9  33.0  39.0   Platelets 150 - 400 K/uL 151          Latest Ref Rng & Units 02/24/2024    5:46 AM 02/23/2024    7:49 AM 02/22/2024   11:05 PM  CMP  Glucose 70 - 99 mg/dL 820   814   BUN 8 - 23 mg/dL 34   44   Creatinine 9.55 - 1.00 mg/dL 8.19   7.79   Sodium 864 - 145 mmol/L 140  140  139   Potassium 3.5 - 5.1 mmol/L 4.4  4.8  5.1   Chloride 98 - 111 mmol/L 114   115   CO2 22 - 32 mmol/L 16     Calcium  8.9 - 10.3 mg/dL 8.9     Total Protein 6.5 - 8.1 g/dL 6.9     Total Bilirubin 0.0 -  1.2 mg/dL 0.8     Alkaline Phos 38 - 126 U/L 80     AST 15 - 41 U/L 18     ALT 0 -  44 U/L 15       MR ANGIO HEAD WO CONTRAST Result Date: 02/23/2024 CLINICAL DATA:  Follow-up examination for acute stroke. EXAM: MRA HEAD WITHOUT CONTRAST TECHNIQUE: Angiographic images of the Circle of Willis were acquired using MRA technique without intravenous contrast. COMPARISON:  Comparison with brain MRI from earlier the same day as well as prior CTA from 09/25/2023. FINDINGS: Anterior circulation: Both internal carotid arteries are patent through the siphons without stenosis or other abnormality. A1 segments patent bilaterally. Normal anterior communicating artery complex. Minor atheromatous irregularity about the ACAs without significant stenosis. No M1 stenosis or occlusion. No proximal MCA branch occlusion. Mild atheromatous irregularity about the distal MCA branches without high-grade stenosis. Posterior circulation: Both V4 segments patent without stenosis. Both PICA patent. Focal fenestration at the vertebrobasilar junction noted. Basilar widely patent without stenosis. Superior cerebellar arteries patent bilaterally. Both PCAs supplied via the basilar. Minimal atheromatous irregularity about the PCAs without significant stenosis. Anatomic variants: None significant. Other: No intracranial aneurysm. IMPRESSION: 1. Negative intracranial MRA for large vessel occlusion. 2. Mild intracranial atherosclerotic disease without hemodynamically significant or correctable stenosis. Electronically Signed   By: Morene Hoard M.D.   On: 02/23/2024 19:18   VAS US  CAROTID Result Date: 02/23/2024 Carotid Arterial Duplex Study Patient Name:  ARETHA LEVI  Date of Exam:   02/23/2024 Medical Rec #: [id number]     Accession #:    [id number] Date of Birth: 04-03-1956     Patient Gender: F Patient Age:   23 years Exam Location:  [facility] Procedure:      VAS US  CAROTID Referring Phys: ARY XU  --------------------------------------------------------------------------------  Indications:       CVA. Risk Factors:      Hypertension, hyperlipidemia, Diabetes, past history of                    smoking, prior CVA. Other Factors:     CKD. Comparison Study:  Previous exam on 03/05/2023 BIL ICA <50% Performing Technologist: Ezzie Potters RVT, RDMS  Examination Guidelines: A complete evaluation includes B-mode imaging, spectral Doppler, color Doppler, and power Doppler as needed of all accessible portions of each vessel. Bilateral testing is considered an integral part of a complete examination. Limited examinations for reoccurring indications may be performed as noted.  Right Carotid Findings: +----------+--------+--------+--------+------------------+------------------+           PSV cm/sEDV cm/sStenosisPlaque DescriptionComments           +----------+--------+--------+--------+------------------+------------------+ CCA Prox  61      9                                                    +----------+--------+--------+--------+------------------+------------------+ CCA Distal34      10                                intimal thickening +----------+--------+--------+--------+------------------+------------------+ ICA Prox  41      15                                                   +----------+--------+--------+--------+------------------+------------------+ ICA Mid  tortuous           +----------+--------+--------+--------+------------------+------------------+ ICA Distal93      29                                tortuous           +----------+--------+--------+--------+------------------+------------------+ ECA       25      4                                                    +----------+--------+--------+--------+------------------+------------------+ +----------+--------+-------+----------------+-------------------+            PSV cm/sEDV cmsDescribe        Arm Pressure (mmHG) +----------+--------+-------+----------------+-------------------+ [id number]             Multiphasic, WNL                    +----------+--------+-------+----------------+-------------------+ +---------+--------+--+--------+--+---------+ VertebralPSV cm/s40EDV cm/s10Antegrade +---------+--------+--+--------+--+---------+  Left Carotid Findings: +----------+--------+--------+--------+------------------+------------------+           PSV cm/sEDV cm/sStenosisPlaque DescriptionComments           +----------+--------+--------+--------+------------------+------------------+ CCA Prox  43      11                                intimal thickening +----------+--------+--------+--------+------------------+------------------+ CCA Distal46      12                                intimal thickening +----------+--------+--------+--------+------------------+------------------+ ICA Prox  37      14              calcific and focal                   +----------+--------+--------+--------+------------------+------------------+ ICA Mid                                             tortuous           +----------+--------+--------+--------+------------------+------------------+ ICA Distal34      13                                tortuous           +----------+--------+--------+--------+------------------+------------------+ ECA       27      6                                                    +----------+--------+--------+--------+------------------+------------------+ +----------+--------+--------+----------------+-------------------+           PSV cm/sEDV cm/sDescribe        Arm Pressure (mmHG) +----------+--------+--------+----------------+-------------------+ [id number]              Multiphasic, WNL                    +----------+--------+--------+----------------+-------------------+  +---------+--------+--+--------+--+---------+ VertebralPSV cm/s34EDV cm/s12Antegrade +---------+--------+--+--------+--+---------+  Summary: Right Carotid: The extracranial vessels were near-normal with only minimal wall                thickening or plaque. Left Carotid: The extracranial vessels were near-normal with only minimal wall               thickening or plaque. Vertebrals:  Bilateral vertebral arteries demonstrate antegrade flow. Subclavians: Normal flow hemodynamics were seen in bilateral subclavian              arteries. *See table(s) above for measurements and observations.  Electronically signed by Ary Cummins MD on 02/23/2024 at 6:20:45 PM.    Final    EEG adult Result Date: 02/23/2024 Shelton Arlin KIDD, MD     02/23/2024  4:22 PM Patient Name: KAJAH SANTIZO MRN: [id number] Epilepsy Attending: Arlin KIDD Shelton Referring Physician/Provider: Cummins Ary, MD Date: 02/23/2024 Duration: 29.24 mins Patient history: 68 y.o. female with PMH of hypertension, diabetes, HLD, COPD, recurrent strokes, CKD3b admitted for confusion and not able to speak well.  EEG to evaluate for seizure Level of alertness: Awake, asleep AEDs during EEG study: None Technical aspects: This EEG study was done with scalp electrodes positioned according to the 10-20 International system of electrode placement. Electrical activity was reviewed with band pass filter of 1-70Hz , sensitivity of 7 uV/mm, display speed of 75mm/sec with a 60Hz  notched filter applied as appropriate. EEG data were recorded continuously and digitally stored.  Video monitoring was available and reviewed as appropriate. Description: The posterior dominant rhythm consists of 8-9 Hz activity of moderate voltage (25-35 uV) seen predominantly in posterior head regions, symmetric and reactive to eye opening and eye closing. Sleep was characterized by vertex waves, sleep spindles (12 to 14 Hz), maximal frontocentral region. EEG showed intermittent 3 to 5 Hz  theta-delta slowing in left temporal region. Hyperventilation and photic stimulation were not performed.    ABNORMALITY - Intermittent slow, left temporal region  IMPRESSION: This study is suggestive of cortical dysfunction arising from left temporal region. No seizures or definite epileptiform discharges were seen throughout the recording.  Please note lack of epileptiform abnormality during interictal EEG does not exclude the diagnosis of epilepsy.   Arlin KIDD Shelton   MR BRAIN WO CONTRAST Result Date: 02/23/2024 CLINICAL DATA:  68 year old female neurologic deficit. Respiratory distress. EXAM: MRI HEAD WITHOUT CONTRAST TECHNIQUE: Multiplanar, multiecho pulse sequences of the brain and surrounding structures were obtained without intravenous contrast. COMPARISON:  Head CT yesterday.  Brain MRI 09/25/2023. FINDINGS: Brain: Chronic infarcts and encephalomalacia: -Small chronic and patchy bilateral cerebellar infarcts. Multiple chronic lacunar infarcts in the bilateral thalami. Bilateral PCA territory encephalomalacia affecting the parietal and occipital lobes. Similar cortical and subcortical white matter encephalomalacia at the bilateral superior frontal gyri. Additional periventricular white matter signal changes and multifocal discrete involvement of the corpus callosum. Comparatively mild to moderate chronic T2 heterogeneity in the pons. Superimposed punctate cortical infarct in the anterior right middle or superior frontal gyrus series 3, image 38. Punctate infarct also in the central right thalamus (series 3, image 25). No other convincing diffusion restriction. But chronic microhemorrhage in the left thalamus is new since April. Chronic hemosiderin in the deep right cerebellar nuclei appears stable. No midline shift, mass effect, evidence of mass lesion, ventriculomegaly, extra-axial collection or acute intracranial hemorrhage. Cervicomedullary junction and pituitary are within normal limits. Vascular:  Major intracranial vascular flow voids are stable since April. Skull and upper cervical spine: Normal background bone marrow signal. Age appropriate visible cervical  spine degeneration. Sinuses/Orbits: Stable, negative. Other: Mastoids remain clear.  Negative visible scalp and face IMPRESSION: 1. Acute on chronic small vessel disease in the form of Punctate lacunar infarcts in both the right thalamus and the right frontal lobe. No associated hemorrhage or mass effect. 2. Underlying advanced chronic ischemic disease, largely stable since April this year although new chronic microhemorrhage in the left thalamus since that time. Electronically Signed   By: VEAR Hurst M.D.   On: 02/23/2024 09:53   CT CHEST ABDOMEN PELVIS WO CONTRAST Result Date: 02/23/2024 EXAM: CT CHEST, ABDOMEN AND PELVIS WITHOUT CONTRAST 02/23/2024 04:26:54 AM TECHNIQUE: CT of the chest, abdomen and pelvis was performed without the administration of intravenous contrast. Multiplanar reformatted images are provided for review. Automated exposure control, iterative reconstruction, and/or weight based adjustment of the mA/kV was utilized to reduce the radiation dose to as low as reasonably achievable. COMPARISON: CT chest from 03/07/2022 and CT abdomen and pelvis from 08/17/2016. CLINICAL HISTORY: Respiratory distress. FINDINGS: CHEST: MEDIASTINUM AND LYMPH NODES: Heart size is at the upper limits of normal. Coronary artery calcifications are present. Loop recorder identified within the left ventral chest wall. No mediastinal, hilar or axillary lymphadenopathy. LUNGS AND PLEURA: Signs of fibrotic interstitial lung disease with bilateral peripheral predominant interstitial reticulation, traction bronchiectasis and subpleural honeycombing. 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 No suspicious lung nodules. No pleural fluid, interstitial edema or pneumothorax. ABDOMEN AND PELVIS: LIVER: The liver is unremarkable.  GALLBLADDER AND BILE DUCTS: Multiple gallstones are identified which measure up to 5 mm, image 54/3. No CT findings of gallbladder wall thickening or pericholecystic inflammation. No bile duct dilatation. SPLEEN: Peripheral low attenuation structure within the spleen measures 1.3 cm, image 46/3. Not seen on previous exams. PANCREAS: No pancreatic inflammation, main duct dilatation or mass. ADRENAL GLANDS: Normal appearance of the adrenal glands. KIDNEYS, URETERS AND BLADDER: No stones in the kidneys or ureters. No hydronephrosis. No perinephric or periureteral stranding. Urinary bladder is unremarkable. GI AND BOWEL: The appendix is visualized and appears normal. Colonic diverticula identified without signs of acute diverticulitis. No abnormal bowel dilatation or inflammation. Signs of previous distal colonic resection with reanastomosis at the level of the rectum, image 102/3. REPRODUCTIVE ORGANS: The uterus appears normal. No adnexal mass. PERITONEUM AND RETROPERITONEUM: No free fluid or fluid collections within the abdomen or pelvis. No free air. VASCULATURE: Mild aortic atherosclerotic calcification. ABDOMINAL AND PELVIS LYMPH NODES: No lymphadenopathy. BONES AND SOFT TISSUES: L5-S1 degenerative disc disease. No acute osseous abnormality. No focal soft tissue abnormality. IMPRESSION: 1. 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 2. Multiple gallstones, the largest measuring 5 mm, without CT findings of gallbladder wall thickening or pericholecystic inflammation. 3. Peripheral low attenuation structure within the spleen measuring 1.3 cm, not seen on previous exams. This is an indeterminate finding with differential considerations including benign and malignant splenic lesions as well as sequelae of the splenic infarct. Solitary  splenic lesions are typically benign. However, in a patient with a known malignancy, consider follow-up imaging with non-emergent contrast-enhanced MRI of the abdomen. Electronically signed by: Waddell Calk MD 02/23/2024 06:10 AM EDT RP Workstation: [id number]   DG Chest Portable 1 View Result Date: 02/23/2024 EXAM: 1 VIEW XRAY OF THE CHEST 02/23/2024 12:33:47 AM COMPARISON: 03/18/2023 CLINICAL HISTORY: SOB. FINDINGS: LUNGS AND PLEURA: Diffuse interstitial coarsening bilaterally greater on the left. No focal pulmonary opacity. No pulmonary edema.  No pleural effusion. No pneumothorax. HEART AND MEDIASTINUM: Stable cardiomediastinal silhouette. Loop recorder. No acute abnormality of the cardiac and mediastinal silhouettes. BONES AND SOFT TISSUES: No acute osseous abnormality. IMPRESSION: 1. Diffuse interstitial coarsening bilaterally, greater on the left. This is compatible with interstitial lung disease. Superimposed infection or edema is difficult to exclude. Electronically signed by: Norman Gatlin MD 02/23/2024 12:40 AM EDT RP Workstation: [id number]   CT HEAD WO CONTRAST Result Date: 02/22/2024 EXAM: CT HEAD WITHOUT CONTRAST 02/22/2024 11:35:20 PM TECHNIQUE: CT of the head was performed without the administration of intravenous contrast. Automated exposure control, iterative reconstruction, and/or weight based adjustment of the mA/kV was utilized to reduce the radiation dose to as low as reasonably achievable. COMPARISON: CT and MRI 09/25/2023 CLINICAL HISTORY: Neuro deficit, acute, stroke suspected. Stroke suspected; Hx of several strokes. FINDINGS: BRAIN AND VENTRICLES: No acute hemorrhage. No evidence of acute infarct. No hydrocephalus. No extra-axial collection. No mass effect or midline shift. Chronic microvascular ischemic changes and generalized atrophy. Small chronic infarcts in the bilateral cerebellar hemispheres. Chronic lacunar infarcts in the thalamus and caudate bilaterally. ORBITS: No acute  abnormality. SINUSES: Chronic sphenoid sinusitis. SOFT TISSUES AND SKULL: No acute soft tissue abnormality. No skull fracture. IMPRESSION: 1. No acute intracranial abnormality. 2. Similar microvascular ischemic change and chronic infarcts. Electronically signed by: Norman Gatlin MD 02/22/2024 11:43 PM EDT RP Workstation: [id number]     Discharge Instructions: Discharge Instructions     Ambulatory referral to Physical Therapy   Complete by: As directed    Call MD for:  hives   Complete by: As directed    Call MD for:  persistant dizziness or light-headedness   Complete by: As directed    Call MD for:  persistant nausea and vomiting   Complete by: As directed    Call MD for:  severe uncontrolled pain   Complete by: As directed    Call MD for:  temperature >100.4   Complete by: As directed    Diet - low sodium heart healthy   Complete by: As directed    Discharge instructions   Complete by: As directed    Ms. Kaleyah Labreck, It was a pleasure taking care of you at [facility]. You were admitted for a stroke and now that you are doing better we are discharging home.  We did make some medication changes. Please follow the following instructions.   1) START taking cilostazol  100 mg twice daily.  Continue taking Plavix  75 mg daily.  STOP taking aspirin .  Please continue taking all of your other medications.  2) Please follow-up with your primary care physician in 1 week for hospital follow-up.  Please also follow-up with your neurologist.  3) For any migraines, you can take Tylenol .  4) If you develop any sudden weakness or sensation losses, please return back to the emergency department.  5) We have set you up with outpatient physical therapy and we have set you up with a rollator to help you outpatient.  Take care,  Dr. Libby Blanch, DO   Increase activity slowly   Complete by: As directed        Signed: Blanch Libby, DO 02/25/2024, 10:10 AM   Internal Medicine Resident  PGY-3

## 2024-02-24 NOTE — Progress Notes (Signed)
 Was asked to evaluate this patient at bedside by bedside and rapid response RN for a change in exam. She was noted to be weaker in her L arm and has a headache in the back of her head.  Exam shows effort dependent weakness in LUE. The LUE hand grip improves with encouragement/coaxing. Intially unable to lift L leg of fthe bed but with encouragement, able to hold L leg off the bed for more than5 secs without any drift.  Given recent strokes, she is at risk so will get CT Head and CTA head and neck. If negative, nothing further to do overnight. Will add her to the stroke list again.  Erica [name] [facility]

## 2024-02-24 NOTE — Progress Notes (Signed)
 Patient called urgently to the nurse's station for bathroom assistance. Patient had not had urgency all day today. RN arrived to find patient attempting to get out of bed which she had not done today. She had called and waited for someone to arrive previously. Patient was found to be incontinent which was new as well. While sitting on the side of the bed she appeared to have difficulty focusing and her breathing was labored. She acknowledged being short of breath, dizzy, very hot, and she had a sudden headache. She was also weaker in extremities from earlier assessments. Patient was assisted back in bed. Rapid response and charge nurse made aware of new neuro symptoms. Leonor, [place], RN and charge nurse Jon arrived at bedside to assist with patient. Vital signs and blood sugar were stable. Dr. Vanessa arrived at bedside shortly after. Patient was transported to CT.

## 2024-02-24 NOTE — Significant Event (Addendum)
 Rapid Response Event Note   Reason for Call :  Sudden onset headache, generalized weakness, shortness of breath  Initial Focused Assessment:  Patient endorses 9/10 headache described as pain and pressure behind her eyes. Generalized weakness, worsening left sided weakness then on admission. Initially some dysarthria that improved. Response to questions are delayed and patient disoriented to month. Breathing regular, unlabored. Clear breath sounds.  VS: BP 140/92, HR 74, RR 21, SpO2 98%  Interventions:  -STAT CT, CTA  Plan of Care:  -Continue current treatment plan  Event Summary:  MD Notified: Dr. Vanessa Call Time: 1830 Arrival Time: 1831 End Time: 1910  Erica LITTIE Danker, RN

## 2024-02-24 NOTE — Hospital Course (Addendum)
#  Acute Ischemic CVA #History of Multiple Ischemic CVA Patient initially presented to the emergency department for concerns of altered mental status.  Patient had imaging that revealed acute on chronic infarcts.  Patient was admitted for secondary risk factor modifications.  A1c at goal, LDL at goal.  Patient was followed by neurology.  Stroke thought to be in the setting of missed medication doses.  Patient worked with PT/OT who recommended outpatient PT.  Patient did well during hospitalization.  Patient discharged on aspirin , Plavix , Crestor , and Zetia .  #Hypertension Continued home antihypertensive medication with amlodipine  10 mg daily.  No acute concerns during hospitalization.   #AKI #CKD3b During hospitalization, patient found to have AKI with admission creatinine of 2.21.  Improved during hospitalization.  Discharge creatinine at ***.   #NAGMA with respiratory compensation NAGMA likely secondary to chronic renal insufficiency.  No acute concerns during hospitalization.   #T2DM A1c during hospitalization found to be 6.2.  Diabetes is well-controlled.  During hospitalization patient was on sliding scale insulin .  At discharge, recommended diet modification.  No need for medications at this time.    #COPD #ILD No acute concerns for COPD exacerbation.  CT imaging showing signs of fibrotic interstitial lung disease which is consistent with history.     #Hypoattenuated splenic structure This incidental finding is indeterminate could be benign versus malignant splenic lesions versus sequelae of splenic infarct.  Given the patient's history of rectal cancer in 2016, we recommend outpatient MRI for further evaluation.   #Asymptomatic cholelithiasis Patient without abdominal pain.  CT imaging findings showing multiple gallstones largest measuring 5 mm without gallbladder wall thickening or pericholecystic inflammation.  Can consider outpatient follow-up if symptomatic.   #GERD Continue home  pantoprazole  during hospitalization.   #MDD Held Zoloft  and Effexor  during hospitalization.  Can resume at discharge.  Follow-up:  A) Acute on chronic ischemic CVA: Patient discharged on aspirin  and Plavix  as well as Crestor  and Zetia .  Continue to monitor secondary risk factors outpatient.  B) AKI:  Patient had AKI on admission.  Creatinine back to baseline at discharge.  Continue to monitor kidney function outpatient.  C) Hypoattenuated splenic structure: Patient found to have incidental finding of hypoattenuation on spleen.  Given history of rectal cancer, recommend outpatient MRI to further characterize.

## 2024-02-24 NOTE — Evaluation (Signed)
 Physical Therapy Evaluation Patient Details Name: Erica [name] MRN: [id number] DOB: [birth year] Today's Date: 02/24/2024  History of Present Illness  Patient is a 68 y/o female who presents on 02/22/24 with altered behavior as noted by family. Chest CT showed interstitial lung disease but no infectious process. MRI showed R thalamic and R frontal infarcts.  PMH:  COPD/IPF, DM, CKD IIIb, HTN, HLD, asthma, GERD, pulmonary fibrosis, peripheral neuropathy, depression/anxiety with previous suicide attempt, multiple CVA's (most recent 4/25 with residual L weakness and slurred speech), dementia.  Clinical Impression  Pt admitted with above diagnosis. Pt from home alone but has frequent checks from son as well as aide 3 days/wk. Pt mobilizing at supervision level with RW, fatigues quickly and has used her sister's rollator when visiting, would be a good device for her with balance impairment and energy deficits. Recommend f/u with outpt PT as she was about to start.  Pt currently with functional limitations due to the deficits listed below (see PT Problem List). Pt will benefit from acute skilled PT to increase their independence and safety with mobility to allow discharge.           If plan is discharge home, recommend the following: Assistance with cooking/housework;Direct supervision/assist for medications management;Direct supervision/assist for financial management;Assist for transportation   Can travel by private Data processing manager (4 wheels)  Recommendations for Other Services       Functional Status Assessment Patient has not had a recent decline in their functional status     Precautions / Restrictions Precautions Precautions: Fall Recall of Precautions/Restrictions: Intact Restrictions Weight Bearing Restrictions Per Provider Order: No      Mobility  Bed Mobility Overal bed mobility: Needs Assistance Bed Mobility: Supine to Sit     Supine to sit:  Supervision     General bed mobility comments: with increased time    Transfers Overall transfer level: Needs assistance Equipment used: Rolling walker (2 wheels) Transfers: Sit to/from Stand Sit to Stand: Contact guard assist           General transfer comment: vc's for hand placement, increased time needed. Practiced from bed and toilet    Ambulation/Gait Ambulation/Gait assistance: Contact guard assist Gait Distance (Feet): 20 Feet Assistive device: Rolling walker (2 wheels) Gait Pattern/deviations: Step-through pattern Gait velocity: decreased Gait velocity interpretation: <1.8 ft/sec, indicate of risk for recurrent falls   General Gait Details: pt with balance impairment that has been present since last CVA, better with RW, fatigues quickly, discussed use of rollator (she uses it when at her sister's house)  Stairs            Wheelchair Mobility     Tilt Bed    Modified Rankin (Stroke Patients Only) Modified Rankin (Stroke Patients Only) Pre-Morbid Rankin Score: Moderate disability Modified Rankin: Moderate disability     Balance Overall balance assessment: Needs assistance, History of Falls Sitting-balance support: No upper extremity supported, Feet supported Sitting balance-Leahy Scale: Good     Standing balance support: No upper extremity supported, During functional activity Standing balance-Leahy Scale: Fair Standing balance comment: able to stand without UE support to perform perineal care                             Pertinent Vitals/Pain Pain Assessment Pain Assessment: Faces Faces Pain Scale: Hurts little more Pain Location: LLE Pain Descriptors / Indicators: Tingling Pain Intervention(s): Limited activity within  patient's tolerance, Monitored during session    Home Living Family/patient expects to be discharged to:: Private residence Living Arrangements: Alone Available Help at Discharge: Family;Available  PRN/intermittently Type of Home: Apartment Home Access: Stairs to enter Entrance Stairs-Rails: Right Entrance Stairs-Number of Steps: 2   Home Layout: One level Home Equipment: Cane - single point;Shower Counsellor (2 wheels);Wheelchair - manual Additional Comments: has an aide 3x/week for 2 hrs/ visit, Son comes by daily to check on Pt.    Prior Function Prior Level of Function : Independent/Modified Independent             Mobility Comments: uses cane versus walker depending on the day ADLs Comments: sister and son assist with IADL's, she prepares microwave meals     Extremity/Trunk Assessment   Upper Extremity Assessment Upper Extremity Assessment: Defer to OT evaluation    Lower Extremity Assessment Lower Extremity Assessment: RLE deficits/detail;LLE deficits/detail;Generalized weakness RLE Deficits / Details: grossly 3+/5 throughout, is at or close to her baseline RLE Sensation: WNL RLE Coordination: WNL LLE Deficits / Details: grossly 3/5 throughout, has had tingling and shooting pain since last CVA LLE Sensation:  (altered) LLE Coordination: decreased gross motor    Cervical / Trunk Assessment Cervical / Trunk Assessment: Normal  Communication   Communication Communication: No apparent difficulties    Cognition Arousal: Alert Behavior During Therapy: WFL for tasks assessed/performed   PT - Cognitive impairments: History of cognitive impairments, Problem solving                       PT - Cognition Comments: slow processing Following commands: Intact       Cueing Cueing Techniques: Verbal cues     General Comments General comments (skin integrity, edema, etc.): VSS    Exercises     Assessment/Plan    PT Assessment Patient needs continued PT services  PT Problem List Decreased strength;Decreased balance;Decreased coordination;Decreased activity tolerance;Pain       PT Treatment Interventions DME instruction;Gait  training;Stair training;Functional mobility training;Therapeutic activities;Therapeutic exercise;Balance training;Patient/family education    PT Goals (Current goals can be found in the Care Plan section)  Acute Rehab PT Goals Patient Stated Goal: return home PT Goal Formulation: With patient Time For Goal Achievement: 03/09/24 Potential to Achieve Goals: Good    Frequency Min 2X/week     Co-evaluation               AM-PAC PT 6 Clicks Mobility  Outcome Measure Help needed turning from your back to your side while in a flat bed without using bedrails?: None Help needed moving from lying on your back to sitting on the side of a flat bed without using bedrails?: None Help needed moving to and from a bed to a chair (including a wheelchair)?: A Little Help needed standing up from a chair using your arms (e.g., wheelchair or bedside chair)?: A Little Help needed to walk in hospital room?: A Little Help needed climbing 3-5 steps with a railing? : A Little 6 Click Score: 20    End of Session Equipment Utilized During Treatment: Gait belt Activity Tolerance: Patient tolerated treatment well Patient left: in chair;with call bell/phone within reach;with chair alarm set Nurse Communication: Mobility status PT Visit Diagnosis: Unsteadiness on feet (R26.81)    Time: 9187-9145 PT Time Calculation (min) (ACUTE ONLY): 42 min   Charges:   PT Evaluation $PT Eval Moderate Complexity: 1 Mod PT Treatments $Gait Training: 8-22 mins $Therapeutic Activity: 8-22 mins PT General  Charges $$ ACUTE PT VISIT: 1 Visit         Richerd Lipoma, PT  Acute Rehab Services Secure chat preferred Office [phone]   Richerd CROME Liann Spaeth 02/24/2024, 10:02 AM

## 2024-02-25 ENCOUNTER — Other Ambulatory Visit (HOSPITAL_COMMUNITY): Payer: Self-pay

## 2024-02-25 DIAGNOSIS — D739 Disease of spleen, unspecified: Secondary | ICD-10-CM | POA: Diagnosis not present

## 2024-02-25 DIAGNOSIS — I639 Cerebral infarction, unspecified: Secondary | ICD-10-CM | POA: Diagnosis not present

## 2024-02-25 DIAGNOSIS — E1122 Type 2 diabetes mellitus with diabetic chronic kidney disease: Secondary | ICD-10-CM | POA: Diagnosis not present

## 2024-02-25 DIAGNOSIS — Z85048 Personal history of other malignant neoplasm of rectum, rectosigmoid junction, and anus: Secondary | ICD-10-CM | POA: Diagnosis not present

## 2024-02-25 DIAGNOSIS — R4182 Altered mental status, unspecified: Secondary | ICD-10-CM | POA: Diagnosis not present

## 2024-02-25 LAB — GLUCOSE, CAPILLARY: Glucose-Capillary: 214 mg/dL — ABNORMAL HIGH (ref 70–99)

## 2024-02-25 MED ORDER — CILOSTAZOL 100 MG PO TABS
100.0000 mg | ORAL_TABLET | Freq: Two times a day (BID) | ORAL | 0 refills | Status: AC
  Filled 2024-02-25: qty 60, 30d supply, fill #0

## 2024-02-25 NOTE — Progress Notes (Signed)
 PT Cancellation Note  Patient Details Name: Erica [name] MRN: [id number] DOB: [birth year]   Cancelled Treatment:    Reason Eval/Treat Not Completed: (P) Other (comment). Pt currently in transport chair being discharged from hospital. If pt does not d/c will plan to follow-up another day.   Theo Ferretti, PT, DPT Acute Rehabilitation Services  Office: [phone]    Theo CHRISTELLA Ferretti 02/25/2024, 12:37 PM

## 2024-02-25 NOTE — Plan of Care (Signed)
  Problem: Education: Goal: Ability to describe self-care measures that may prevent or decrease complications (Diabetes Survival Skills Education) will improve Outcome: Progressing Goal: Individualized Educational Video(s) Outcome: Progressing   Problem: Coping: Goal: Ability to adjust to condition or change in health will improve Outcome: Progressing   Problem: Fluid Volume: Goal: Ability to maintain a balanced intake and output will improve Outcome: Progressing   Problem: Health Behavior/Discharge Planning: Goal: Ability to identify and utilize available resources and services will improve Outcome: Progressing Goal: Ability to manage health-related needs will improve Outcome: Progressing   Problem: Metabolic: Goal: Ability to maintain appropriate glucose levels will improve Outcome: Progressing   Problem: Tissue Perfusion: Goal: Adequacy of tissue perfusion will improve Outcome: Progressing   Problem: Education: Goal: Knowledge of disease or condition will improve Outcome: Progressing Goal: Knowledge of secondary prevention will improve (MUST DOCUMENT ALL) Outcome: Progressing Goal: Knowledge of patient specific risk factors will improve (DELETE if not current risk factor) Outcome: Progressing   Problem: Ischemic Stroke/TIA Tissue Perfusion: Goal: Complications of ischemic stroke/TIA will be minimized Outcome: Progressing   Problem: Health Behavior/Discharge Planning: Goal: Ability to manage health-related needs will improve Outcome: Progressing Goal: Goals will be collaboratively established with patient/family Outcome: Progressing   Problem: Self-Care: Goal: Ability to participate in self-care as condition permits will improve Outcome: Progressing Goal: Verbalization of feelings and concerns over difficulty with self-care will improve Outcome: Progressing Goal: Ability to communicate needs accurately will improve Outcome: Progressing   Problem: Health  Behavior/Discharge Planning: Goal: Ability to manage health-related needs will improve Outcome: Progressing   Problem: Education: Goal: Knowledge of General Education information will improve Description: Including pain rating scale, medication(s)/side effects and non-pharmacologic comfort measures Outcome: Progressing   Problem: Clinical Measurements: Goal: Ability to maintain clinical measurements within normal limits will improve Outcome: Progressing Goal: Will remain free from infection Outcome: Progressing Goal: Diagnostic test results will improve Outcome: Progressing Goal: Respiratory complications will improve Outcome: Progressing Goal: Cardiovascular complication will be avoided Outcome: Progressing

## 2024-02-25 NOTE — Discharge Instructions (Addendum)
 [name], It was a pleasure taking care of you at [facility]. You were admitted for a stroke and now that you are doing better we are discharging home.  We did make some medication changes. Please follow the following instructions.   1) START taking cilostazol  100 mg twice daily.  Continue taking Plavix  75 mg daily.  STOP taking aspirin . Please continue taking all of your other medications.  2) Please follow-up with your primary care physician in 1 week for hospital follow-up.  Please also follow-up with your neurologist.  3) For any migraines, you can take Tylenol .  4) If you develop any sudden weakness or sensation losses, please return back to the emergency department.  5) We have set you up with outpatient physical therapy and we have set you up with a rollator to help you outpatient.  Take care,  Dr. Libby Blanch, DO

## 2024-02-26 ENCOUNTER — Other Ambulatory Visit (HOSPITAL_COMMUNITY): Payer: Self-pay

## 2024-02-26 ENCOUNTER — Encounter: Payer: Self-pay | Admitting: Occupational Therapy

## 2024-02-26 NOTE — Therapy (Unsigned)
 [facility] [address] [place], [place], [address] Phone: [phone]   Fax:  [phone]  Patient Details  Name: Erica [name] MRN: [id number] Date of Birth: 1955/12/31 Referring Provider:  No ref. provider found  Encounter Date: 02/26/2024  OCCUPATIONAL THERAPY DISCHARGE SUMMARY  Visits from Start of Care: 4  Current functional level related to goals / functional outcomes: Unknown - pt had new stroke and unable to assess secondary to not returning   Remaining deficits: Same as initial evaluation   Education / Equipment: HEP's, safety considerations for sensory loss   Patient agrees to discharge. Patient goals were not met. Patient is being discharged due to a change in medical status.SABRA Burnard JINNY Abelardo, OT 02/26/2024, 10:25 AM  [facility] [address] [place], [place], [address] Phone: [phone]   Fax:  [phone]

## 2024-02-27 ENCOUNTER — Ambulatory Visit: Attending: Infectious Diseases | Admitting: Physical Therapy

## 2024-02-27 ENCOUNTER — Encounter: Payer: Self-pay | Admitting: Physical Therapy

## 2024-02-27 ENCOUNTER — Ambulatory Visit

## 2024-02-27 VITALS — BP 117/79 | HR 87

## 2024-02-27 DIAGNOSIS — R41840 Attention and concentration deficit: Secondary | ICD-10-CM | POA: Insufficient documentation

## 2024-02-27 DIAGNOSIS — I69354 Hemiplegia and hemiparesis following cerebral infarction affecting left non-dominant side: Secondary | ICD-10-CM | POA: Diagnosis present

## 2024-02-27 DIAGNOSIS — R2681 Unsteadiness on feet: Secondary | ICD-10-CM | POA: Diagnosis present

## 2024-02-27 DIAGNOSIS — M6281 Muscle weakness (generalized): Secondary | ICD-10-CM | POA: Diagnosis present

## 2024-02-27 DIAGNOSIS — I639 Cerebral infarction, unspecified: Secondary | ICD-10-CM | POA: Diagnosis not present

## 2024-02-27 NOTE — Therapy (Signed)
 OUTPATIENT PHYSICAL THERAPY NEURO EVALUATION   Patient Name: Erica [name] MRN: [id number] DOB:1955/10/20, 68 y.o., female Today's Date: 02/27/2024   PCP: Delores Rojelio Caldron, NP REFERRING PROVIDER: Eben Reyes BROCKS, MD  END OF SESSION:  PT End of Session - 02/27/24 1015     Visit Number 1    Number of Visits 13    Date for PT Re-Evaluation 04/16/24    Authorization Type [employer] Dual Complete    PT Start Time 1010    PT Stop Time 1048    PT Time Calculation (min) 38 min    Equipment Utilized During Treatment Other (comment)   Foot-up brace on LLE   Activity Tolerance Patient tolerated treatment well    Behavior During Therapy WFL for tasks assessed/performed   Confused         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   GERD (gastroesophageal reflux disease)    with chronic cough   Headache(784.0)    mild   Hypertension    Loop Biotronik loop implant 12/16/2020 12/16/2020   Mental disorder    depression   Neuropathy    feet    Pain    arthritis pain - takes tramadol  as needed   Pulmonary fibrosis, unspecified (HCC)    Rectal cancer (HCC)    Rectal polyp    very little bleeding with bowel movements- no pain   Stroke (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   Suicide attempt [facility])    Past Surgical History:  Procedure Laterality Date   ANAL RECTAL MANOMETRY N/A 07/13/2016   Procedure: ANO RECTAL MANOMETRY;  Surgeon: Bernarda Ned, MD;  Location: [facility];  Service: Endoscopy;  Laterality: N/A;   CESAREAN SECTION     EUS N/A 11/21/2012   Procedure: LOWER ENDOSCOPIC ULTRASOUND (EUS);  Surgeon: Elsie Cree, MD;  Location: [facility];  Service: Endoscopy;  Laterality: N/A;   FLEXIBLE SIGMOIDOSCOPY N/A 11/21/2012   Procedure: FLEXIBLE SIGMOIDOSCOPY;  Surgeon: Elsie Cree, MD;  Location: [facility];  Service:  Endoscopy;  Laterality: N/A;   FLEXIBLE SIGMOIDOSCOPY N/A 01/28/2013   Procedure: FLEXIBLE SIGMOIDOSCOPY;  Surgeon: Bernarda Ned, MD;  Location: [facility];  Service: Endoscopy;  Laterality: N/A;   LAPAROSCOPIC LOW ANTERIOR RESECTION N/A 01/29/2013   Procedure: LAPAROSCOPIC LOW ANTERIOR RESECTION, Rigid Proctoscopy;  Surgeon: Bernarda Ned, MD;  Location: [facility];  Service: General;  Laterality: N/A;   LAPAROSCOPIC SIGMOID COLECTOMY N/A 11/14/2012   Procedure: DIAGNOSTIC LAPAROSCOPY AND SIGMOIDMOIDOSCOPY ;  Surgeon: Donnice Bury, MD;  Location: [facility];  Service: General;  Laterality: N/A;   RECTAL ULTRASOUND N/A 07/13/2016   Procedure: RECTAL ULTRASOUND;  Surgeon: Bernarda Ned, MD;  Location: [facility];  Service: Endoscopy;  Laterality: N/A;   TONSILLECTOMY     TONSILLECTOMY AND ADENOIDECTOMY     Patient Active Problem List   Diagnosis Date Noted   Acute alteration in mental status 02/23/2024   Alteration of sensation as late effect of stroke 10/19/2023   Gait disturbance, post-stroke 10/19/2023   Left thalamic infarction (HCC) 09/28/2023   Acute lacunar infarction in the left thalamus (HCC) 09/25/2023   History of CVA with residual deficit 09/25/2023   Type 2 diabetes mellitus with hypoglycemia, with long-term current use of insulin  (HCC) 09/25/2023   Idiopathic pulmonary fibrosis (HCC) 09/25/2023   Hyperlipidemia associated with type  2 diabetes mellitus (HCC) 09/25/2023   Syncope 03/18/2023   Stroke (cerebrum) (HCC) 03/08/2023   Acute ischemic stroke (HCC) 03/05/2023   Stroke-like symptom 03/04/2023   Research study patient 07/15/2022   Encounter for loop recorder check 01/02/2021   Loop Biotronik loop implant 12/16/2020 12/16/2020   Leukopenia    Controlled type 2 diabetes mellitus with hyperglycemia, with long-term current use of insulin  (HCC)    Vascular headache    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   Sinus bradycardia on ECG  11/03/2020   Benign essential HTN    Pancytopenia (HCC)    Chronic kidney disease, stage 3b (HCC)    Anemia    Labile blood glucose    Labile blood pressure    Diabetic peripheral neuropathy (HCC)    Hypertension associated with diabetes (HCC)    AKI (acute kidney injury) (HCC)    Ataxia, late effect of cerebrovascular disease    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   Acute CVA (cerebrovascular accident) (HCC) 06/26/2020   CVA (cerebral vascular accident) (HCC) 06/25/2020   BMI 32.0-32.9,adult 06/03/2020   Left upper lobe pneumonia 03/30/2020   Chest pain 10/01/2019   MDD (major depressive disorder), recurrent severe, 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   Suicide attempt (HCC) 05/09/2019   Cocaine abuse (HCC) 05/09/2019   Interstitial lung disease (HCC) 06/21/2016   COPD (chronic obstructive pulmonary disease) (HCC) 06/21/2016   Cough 06/21/2016   GERD (gastroesophageal reflux disease) 06/21/2016   Hypersomnia 06/21/2016   Tobacco user 06/21/2016   Rectal cancer (HCC) 01/30/2013    ONSET DATE: 02/24/2024 (referral)   REFERRING DIAG: I63.9 (ICD-10-CM) - Acute CVA (cerebrovascular accident) (HCC)  THERAPY DIAG:  Unsteadiness on feet  Muscle weakness (generalized)  Attention and concentration deficit  Hemiplegia and hemiparesis following cerebral infarction affecting left non-dominant side (HCC)  Rationale for Evaluation and Treatment: Rehabilitation  SUBJECTIVE:                                                                                                                                                                                             SUBJECTIVE STATEMENT: Pt returns to clinic following new CVA, using SBQC that is improperly adjusted. Pt reports she had another stroke but is doing pretty well for an old lady. Pt reports she is still living alone, is not driving, used an Pharmacist, community to get here. Denies falls. Had a rollator ordered when in the hospital but has  not received it yet.   States her son provides meals for her that she can warm up in the air fryer. Has an aide that comes  3 hours/day that assists w/cleaning, cannot remember how many days per week that the aide comes. Later states that an aide is not currently coming to her house and she is to call to set this up. States her sister comes to check on her 1x/day but otherwise is alone at home.   Pt reports she needs to have a colonoscopy because she was told she had rectal cancer >10 years ago and every time she tries to get her colonoscopy, she has a stroke.   Pt accompanied by: self  PERTINENT HISTORY: COPD/IPF, DM, CKD IIIb, HTN, HLD, asthma, GERD, pulmonary fibrosis, peripheral neuropathy, depression/anxiety with previous suicide attempt, multiple CVA's, dementia.  PAIN:  Are you having pain? No Pt reports she frequently has pain in her legs, but took her pain meds so currently does not have pain   PRECAUTIONS: Fall and Other: loop recorder   RED FLAGS: None   WEIGHT BEARING RESTRICTIONS: No  FALLS: Has patient fallen in last 6 months? No  LIVING ENVIRONMENT: Lives with: lives alone and Sister comes to check on her daily  Lives in: House/apartment Stairs: Yes: External: 2 steps; pt cannot remember, thinks she has rails  Has following equipment at home: Quad cane small base, Environmental consultant - 2 wheeled, Wheelchair (manual), shower chair, and Grab bars  PLOF: Requires assistive device for independence, Needs assistance with ADLs, Needs assistance with homemaking, and Needs assistance with gait  PATIENT GOALS: I want to be more independent and drive again   OBJECTIVE:  Note: Objective measures were completed at Evaluation unless otherwise noted.  DIAGNOSTIC FINDINGS: MRI of brain from 02/24/24  FINDINGS: Brain: Cerebral volume within normal limits. Patchy and confluent T2/FLAIR hyperintensity involving the periventricular deep white matter both cerebral hemispheres as well as the  pons, consistent with chronic small vessel ischemic disease. Scattered chronic lacunar infarcts about the bilateral basal ganglia and thalami. Multiple remote cerebellar infarcts. Chronic encephalomalacia involving the bilateral frontoparietal regions and occipital lobes, stable.   Previously identified punctate infarct involving the right thalamus, stable (series 5, image 71). Additional punctate cortical infarct at the high anterior right frontal lobe (series 5, image 84), also unchanged. Additional 5 mm acute ischemic infarct involving the lateral right cerebellum (series 5, image 54), stable in retrospect. No associated hemorrhage or mass effect. No other evidence for new or interval infarction. No acute intracranial hemorrhage. Few small chronic micro hemorrhages involving the right cerebellum and left greater than right thalami noted.   No mass lesion, midline shift, or mass effect. No hydrocephalus or extra-axial fluid collection. Pituitary gland and suprasellar region within normal limits.   Vascular: Major intracranial vascular flow voids are maintained.   Skull and upper cervical spine: Craniocervical junction within normal limits. Bone marrow signal intensity normal. No scalp soft tissue abnormality.   Sinuses/Orbits: Globes and orbital soft tissues within normal limits. Small volume layering secretions noted within the left sphenoid sinus. Paranasal sinuses are otherwise largely clear. No significant mastoid effusion.   Other: None.   IMPRESSION: 1. Stable brain MRI as compared to 02/23/2024. Punctate acute ischemic infarcts involving the right thalamus, high anterior right frontal lobe, and lateral right cerebellum (stable in retrospect). No associated hemorrhage or mass effect. 2. Underlying chronic microvascular ischemic disease with multiple remote infarcts as above.    COGNITION: Overall cognitive status: Impaired and Difficulty to assess due to: no  family present   SENSATION: Pt reports frequent cramping/spasms in BLEs    POSTURE: rounded shoulders and increased thoracic kyphosis  LOWER EXTREMITY ROM:     Active  Right Eval Left Eval  Hip flexion    Hip extension    Hip abduction    Hip adduction    Hip internal rotation    Hip external rotation    Knee flexion    Knee extension    Ankle dorsiflexion    Ankle plantarflexion    Ankle inversion    Ankle eversion     (Blank rows = not tested)  LOWER EXTREMITY MMT:  Tested in seated position   MMT Right Eval Left Eval  Hip flexion 4+ 4-  Hip extension    Hip abduction 4+ 4+  Hip adduction 4+ 4  Hip internal rotation    Hip external rotation    Knee flexion 4+ 4  Knee extension 4+ 4  Ankle dorsiflexion 4+ 4  Ankle plantarflexion    Ankle inversion    Ankle eversion    (Blank rows = not tested)  BED MOBILITY:  Not tested Pt reports she is independent w/this but would like a step stool to help get in/out due to the height of her bed   TRANSFERS: Sit to stand: CGA  Assistive device utilized: Counselling psychologist     Stand to sit: CGA  Assistive device utilized: Counselling psychologist      RAMP:  Not tested  CURB:  Not tested  STAIRS: Not tested GAIT: Gait pattern: step through pattern, decreased step length- Right, decreased stance time- Left, decreased stride length, decreased hip/knee flexion- Right, decreased hip/knee flexion- Left, decreased ankle dorsiflexion- Right, decreased ankle dorsiflexion- Left, lateral hip instability, decreased trunk rotation, narrow BOS, poor foot clearance- Right, and poor foot clearance- Left Distance walked: Various clinic distances  Assistive device utilized: Quad cane small base and foot-up brace on LLE  Level of assistance: CGA and Min A Comments: Pt ambulated into clinic w/SBQC, turned in wrong direction so pt tripping over cane, requiring min A for safety. Pt wearing foot-up brace on LLE despite demonstrating 4/5 L  ankle DF MMT. Pt began scuffing L foot w/fatigue, requiring min A to prevent fall. Pt continued to trip over [facility] despite therapist readjusting it.    FUNCTIONAL TESTS:   [facility] PT Assessment - 02/27/24 1033       Transfers   Five time sit to stand comments  19.88s   UE support on rep 1 only, weight shift to R side, lack of full hip/knee extension     Ambulation/Gait   Gait velocity 32.8' over 30.25s =1.08 ft/s w/SBQC   Min A due to frequent tripping over LLE, high fall risk     Timed Up and Go Test   Normal TUG (seconds) 32.85   SBQC, min cues for proper technique          VITALS  Vitals:   02/27/24 1027  BP: 117/79  Pulse: 87  TREATMENT:   Self-care  Assessed vitals (see above) and WNL Informed pt of her high fall risk and significant safety concerns with her being alone at home. Pt reports she will call to set up an aide today, initially stating she only needed one during the day and then later reporting she only needs an aide at night so she will not fall if she gets up to use the restroom. Recommended pt use RW at all times at home for safety, as she is not stable with the cane. Pt verbalized understanding and agreement. Informed pt to bring the RW or rollator (if she gets it) with her to her next session.    PATIENT EDUCATION: Education details: POC, eval findings, see self-care above  Person educated: Patient Education method: Explanation Education comprehension: verbalized understanding and needs further education  HOME EXERCISE PROGRAM: To be reviewed from previous POC: [id number]   GOALS: Goals reviewed with patient? Yes  SHORT TERM GOALS: Target date: 03/26/2024   Pt will be independent with initial HEP for improved strength, balance, transfers and gait.  Baseline: Goal status: INITIAL  2.  Pt will be compliant w/use of RW or  rollator at all times for reduced fall risk and improved independence  Baseline: using SBQC  Goal status: INITIAL  3.  Pt will improve 5 x STS to less than or equal to 17 seconds w/equal weightbearing through BLEs to demonstrate improved functional strength and transfer efficiency.   Baseline: 19.88s w/weight shift to R  Goal status: INITIAL  4.  Pt will improve gait velocity to at least 1.4 ft/s w/LRAD and CGA for improved gait efficiency and independence   Baseline: 1.08 ft/s w/SBQC and min A Goal status: INITIAL  5.  Pt will improve normal TUG to less than or equal to 25 seconds w/LRAD and SBA for improved functional mobility and decreased fall risk.  Baseline: 32.85s w/SBQC and min cues Goal status: INITIAL   LONG TERM GOALS: Target date: 04/09/2024   Pt will be independent with final HEP for improved strength, balance, transfers and gait.  Baseline:  Goal status: INITIAL  2.  Pt will improve 5 x STS to less than or equal to 15 seconds to demonstrate improved functional strength and transfer efficiency.   Baseline: 19.88s Goal status: INITIAL  3.  Pt will improve gait velocity to at least 1.65ft/s w/LRAD and SBA for improved gait efficiency and reduced fall risk   Baseline: 1.08 ft/s w/SBQC and min A Goal status: INITIAL  4.  Pt will improve normal TUG to less than or equal to 20 seconds w/LRAD and SBA for improved functional mobility and decreased fall risk.  Baseline: 32.85s w/SBQC and min cues  Goal status: INITIAL  ASSESSMENT:  CLINICAL IMPRESSION: Patient is a 68 year old female referred to Neuro OPPT for acute CVA.   Pt's PMH is significant for: COPD/IPF, DM, CKD IIIb, HTN, HLD, asthma, GERD, pulmonary fibrosis, peripheral neuropathy, depression/anxiety with previous suicide attempt, multiple CVA's, dementia. The following deficits were present during the exam: decreased safety awareness, impaired functional strength, impaired sensation, improper gait kinematics  and global deconditioning. Based on impaired cognition, gait speed and TUG, pt is an incr risk for falls. Pt would benefit from skilled PT to address these impairments and functional limitations to maximize functional mobility independence.    OBJECTIVE IMPAIRMENTS: Abnormal gait, decreased activity tolerance, decreased balance, decreased cognition, decreased coordination, decreased endurance, decreased knowledge of condition, decreased knowledge of use of DME, decreased mobility, difficulty  walking, decreased strength, decreased safety awareness, impaired perceived functional ability, increased muscle spasms, impaired sensation, impaired tone, improper body mechanics, and pain.   ACTIVITY LIMITATIONS: carrying, lifting, bending, standing, squatting, sleeping, stairs, transfers, bathing, hygiene/grooming, locomotion level, and caring for others  PARTICIPATION LIMITATIONS: meal prep, cleaning, laundry, medication management, personal finances, interpersonal relationship, driving, shopping, community activity, and yard work  PERSONAL FACTORS: Behavior pattern, Fitness, Past/current experiences, Transportation, and 1-2 comorbidities: Dementia and chronic CVA are also affecting patient's functional outcome.   REHAB POTENTIAL: Fair Due to cognitive impairments and chronic CVA  CLINICAL DECISION MAKING: Evolving/moderate complexity  EVALUATION COMPLEXITY: Moderate  PLAN:  PT FREQUENCY: 2x/week  PT DURATION: 6 weeks  PLANNED INTERVENTIONS: 97164- PT Re-evaluation, 97750- Physical Performance Testing, 97110-Therapeutic exercises, 97530- Therapeutic activity, [id number]- Neuromuscular re-education, 97535- Self Care, [id number]- Manual therapy, [id number]- Gait training, [phone]- Orthotic Initial, [phone]- Orthotic/Prosthetic subsequent, [phone]- Aquatic Therapy, [phone]- Electrical stimulation (manual), [phone] (1-2 muscles), [id number] (3+ muscles)- Dry Needling, Patient/Family education, Balance training, Stair training, Joint  mobilization, Spinal mobilization, Vestibular training, DME instructions, and Wheelchair mobility training  PLAN FOR NEXT SESSION: Monitor vitals. Did she bring RW or rollator? Work on endurance, LLE NMR (step taps, obstacle navigation). Sit to stands w/shift to L side, visual tracking.    Maevyn Riordan E Anyelo Mccue, PT, DPT 02/27/2024, 10:57 AM

## 2024-02-29 NOTE — Progress Notes (Signed)
 Remote Loop Recorder Transmission

## 2024-03-03 ENCOUNTER — Ambulatory Visit: Payer: Self-pay | Admitting: Cardiology

## 2024-03-04 ENCOUNTER — Ambulatory Visit: Admitting: Physical Therapy

## 2024-03-07 ENCOUNTER — Other Ambulatory Visit: Payer: Self-pay | Admitting: *Deleted

## 2024-03-07 ENCOUNTER — Ambulatory Visit: Admitting: Physical Therapy

## 2024-03-07 MED ORDER — TOPIRAMATE 25 MG PO TABS: 25.0000 mg | ORAL_TABLET | Freq: Two times a day (BID) | ORAL | 8 refills | Status: DC

## 2024-03-09 NOTE — Progress Notes (Signed)
 Remote Loop Recorder Transmission

## 2024-03-11 ENCOUNTER — Ambulatory Visit: Admitting: Physical Therapy

## 2024-03-12 ENCOUNTER — Ambulatory Visit

## 2024-03-12 NOTE — Progress Notes (Signed)
 Patient was here exchange made patient is very happy with new shoes shoes were fit with DM inserts as well  Lolita Schultze Cped, CFo, CFm

## 2024-03-13 ENCOUNTER — Ambulatory Visit: Admitting: Physical Therapy

## 2024-03-18 ENCOUNTER — Ambulatory Visit: Admitting: Physical Therapy

## 2024-03-20 ENCOUNTER — Ambulatory Visit: Admitting: Physical Therapy

## 2024-03-25 ENCOUNTER — Ambulatory Visit: Admitting: Physical Therapy

## 2024-03-25 NOTE — Progress Notes (Signed)
 Remote Loop Recorder Transmission

## 2024-03-27 ENCOUNTER — Ambulatory Visit: Admitting: Physical Therapy

## 2024-04-01 ENCOUNTER — Ambulatory Visit: Admitting: Physical Therapy

## 2024-04-03 ENCOUNTER — Ambulatory Visit: Admitting: Physical Therapy

## 2024-04-03 ENCOUNTER — Telehealth: Payer: Self-pay | Admitting: Adult Health

## 2024-04-03 NOTE — Progress Notes (Unsigned)
 [facility] [address] [place]. [place] [address] [phone]       OFFICE FOLLOW UP NOTE  [name] Date of Birth:  Aug 07, 1955 Medical Record Number:  [id number]   Reason for visit: hospital stroke follow up    SUBJECTIVE:   CHIEF COMPLAINT:  No chief complaint on file.    HPI:    Update 04/04/2024 JM: Patient returns for scheduled follow-up visit but unfortunately had recent admission on 9/11 after presenting to ED with confusion and speech difficulty.  Stroke workup revealed 3 small infarcts involving right cerebellum, thalamus and frontal cortex of unclear etiology, possibly small vessel disease versus cardioembolic source.  CTA negative LVO, carotid Doppler unremarkable.  Loop recorder interrogated without evidence of A-fib.  LDL 28.  A1c 6.2.  On Plavix  PTA and added Pletal  100mg  BID. Continued crestor  and zetia .       Completed swallowing study back in July with functional oropharyngeal swallowing with mild impairments noted for oral phase of swallow efficiency.  Recommended dysphagia 3 diet with thin liquids.    History provided for reference purposes only Update 12/06/2023 JM: Patient returns for scheduled follow-up visit but unfortunately was hospitalized on 09/25/2023 after presenting with right tongue and right groin numbness, difficulty walking and AMS with evidence of left thalamic infarct likely secondary to small vessel disease.  Loop recorder interrogated without evidence of A-fib.  CTA head/neck unremarkable.  EF 55 to 60%.  On aspirin  PTA, recommended DAPT for 3 weeks on Plavix  alone, continue Crestor  40 mg daily and added Zetia  for LDL 78.  A1c 5.6.  Discharged to CIR for 12 days prior to returning home.  Currently, reports residual numbness in lips, tongue and jaw. She can have painful sensation that radiates from side of mouth into ear at times. She also reports swelling in right jaw, recently saw dentist without concerning  findings.  She denies any numbness or weakness in right upper or lower extremity. She does report difficulty swallowing even on saliva, has frequent choking episodes. She did complete swallow study during CIR stay and recommended Dys 2 diet but admits to not maintaining this. Dr. Carilyn referred her to SLP recently, has not yet scheduled appointment. Reports she completed HH SLP and OT last week, has 1 more session of HH PT. She is concerned regarding continued left sided weakness from prior stroke, she also reports continued short term memory difficulty and speech difficulty. Ambulates with cane, no recent falls. Does live alone, trying to get aide assistance as at times she has difficulty fully maintaining ADLs and IADLs.  She does not drive, she took an Pharmacist, community to todays appointment. She also mentions have daily headaches over the past several weeks, located right occipital/parietal area, reports they last until she takes something such as Tylenol  but also reports Tylenol  doesn't seem to help. Denies photophobia, phonophobia or N/V with headaches. Denies vision changes or new/worsening stroke/TIA symptoms since onset of headaches. Completed 3 weeks DAPT, remains on Plavix  alone as well as Crestor  and Zetia . She also reports occasional use of aspirin  as advised by PCP.  Blood pressure well-controlled.  Routinely follows with PCP for stroke risk factor management. She is scheduled for pulmonology consult next week to evaluate for possible underlying sleep apnea.  Update 04/20/2023 JM: Patient returns for hospital follow-up.  She presented to [facility] ED with altered mental status on 03/04/2023. CTH negative for acute stroke.  MRI brain showed small acute to subacute bilateral centrum semiovale infarcts.  MRA brain normal.  Carotid duplex less than 50% stenosis bilateral ICAs, minimal calcified plaque at left ICA origin.  EF 65 to 70%.  Evaluated by neurology team, suspected infarcts bilateral small vessel disease.   Recommended DAPT for 3 weeks then aspirin  alone and increase Crestor  from 20 mg to 40 mg daily. LDL 86. A1c 6.9.  She was discharged to CIR.  She returned to ED 10/5 after syncopal episode.  Noted orthostatic hypotension likely contributing to syncopal event.  CTH negative. CTA head/neck minimal arthrosclerotic calcifications of bilateral ICAs without significant stenosis otherwise unremarkable.  MRI brain showed acute left thalamic infarct and multiple chronic infarcts including bilateral occipital lobe infarcts, watershed territory infarcts and diffuse subcortical white matter disease, and bilateral basal ganglia and cerebellar infarcts.  She was seen by the neurology team who felt etiology likely small vessel disease.  Recommended DAPT for 21 days and continue Crestor .  Loop recorder interrogated without evidence of A-fib.  She was discharged with home health therapies.  Currently, she reports being stable since discharge.  She reports residual difficulty with speech, worsening left-sided weakness weakness (compared to baseline from prior stroke) and some imbalance. Has not been able to start therapy due to difficulty scheduling.  Ambulates with cane, no recent falls. Lives in own home but does have son and sister to assist as needed.  She does not drive, was brought to todays visit by family, waiting in waiting room. Denies new stroke/TIA symptoms.   She does report excessive daytime fatigue even prior to recent strokes.  Has difficulty falling asleep at night, will use trazodone  if needed but prefers to avoid if able as this causes night terrors.  Has been told that she snores.  Unsure if witnessed apneas as she sleeps alone.  She did undergo prior sleep study in 2018 but she reports she did not sleep well so they were not able to get accurate results, did review results which did show concern of OSA.   Completed 3 weeks DAPT, remains on aspirin  as advised and Crestor  without side effects.  Routinely  follows with PCP for stroke risk factor management - reports starting Actos yesterday by PCP and stopping insulin  but she is concerned glucose levels elevated this morning, she was encouraged to reach out to their office to further discuss.  Blood pressure today slightly elevated, routinely monitors at home and can fluctuate typically ranging 100-140s/70-80s, reports BP managed by PCP. Was seen by cardiology on 11/6, was referred to hematology for hypercoagulable workup given recurrent strokes, has initial visit 12/18.  Loop recorder has not shown evidence of A-fib thus far.  Update 12/02/2021 JM: Patient returns for stroke follow-up after prior visit 7 months ago.  She did present to ED on 6/12 with complaints of worsening left-sided facial numbness and gradual left leg weakness throughout the day as well as walking difficulty on 6/10, due to persisting she proceeded to ED.  Completed MRI brain which was negative for acute stroke.  Symptoms gradually improved prior to discharge. Lab work markable except elevated creatinine level advise follow-up with cardiology to discuss Entresto .  She has been doing since then. Does confirm she returned back to baseline functioning prior to leaving.  Feels as though left-sided weakness has improved since prior visit.  Ambulating with cane, denies any recent falls.  Does still have mild left hand numbness and left lip tingling.  She is concerned regarding her short-term memory which has been poor since her stroke. She will forget recent  conversations or walk in to a room and forget why she went in to the room. No issues with long term.  Does do word searches at home. Does admit to some underlying anxiety and difficulty concentrating.  Is currently on sertraline  and venlafaxine  managed by PCP.  Sleep is poor with longstanding insomnia, has sleep study in 2018 which was negative for sleep apnea (per pt report, unable to view results via epic). Reports her mother had Alzheimer's  disease although never formally diagnosed and did have a large stroke in her 61's which was around the time she started to have memory loss. She is fearful that she may be developing Alzheimer's dementia and would like to see a psychologist for such.   Reports compliance on aspirin  and Crestor , denies side effects.  Blood pressure today 145/85.  Reports recent A1c 5.2.  Routinely followed by PCP.   No further concerns at this time  Update 04/29/2021 JM: Returns for 74-month stroke follow-up unaccompanied  Continues to work with therapies making gradual improving of left sided weakness. Continues to use RW and at times her cane. Denies any recent falls.  Denies new stroke/TIA symptoms. Her son continues to live with her but able to maintain ADLs and majority of IADLs independently.   Compliant on aspirin  and crestor  - denies side effects Blood pressure today slightly increased - reports increased stressors today but monitors at home and typically stable Glucose levels stable at home -has not had recent A1c check Recent direct LDL 37  Plans to contact PCP to schedule follow-up visit Routinely followed by pulmonology for pulmonary fibrosis  No new concerns at this time  Update 12/30/2020 JM: Ms. Brechtel returns for hospital follow-up regarding recent stroke on 11/03/2020.  She presented with 2-3 day hx of headaches and worsening left-sided weakness.  Personally reviewed hospitalization pertinent progress notes, lab work and imaging summary provided.  Evaluated by Dr. Rosemarie with stroke work-up revealing right parietal occipital stroke likely due to small vessel disease although unable to rule out cardioembolic source given recent right carotid head and right anterior lateral thalamic infarcts.  MRA negative LVO.  Carotid Dopplers unremarkable.  Normal EF.  Loop recorder recommended but declined by patient inpatient.  Prior 14-day cardiac monitor completed which was negative for A. fib.  On Plavix  PTA and  recommended DAPT for 3 weeks and aspirin  alone.  HTN stable.  LDL 56 on Crestor  20 mg daily.  Controlled DM with A1c 5.2.  Other stroke risk factors include recent stroke (see below), advanced age, former tobacco use and CAD.  Discharged to CIR on 11/06/2020 -11/21/2020.  Since discharge, she has been making gradual improvements.  Currently working with home health PT for residual left-sided weakness and left sided numbness. She has since completed SLP. She has also been having headaches since her stroke - has been taking tramadol  with benefit which was started by PMR.  Continues to ambulate with RW at all times -denies any recent falls. Her son is currently living with her. Able to maintain ADLs independently. Completed 3 weeks DAPT and remains on aspirin  alone as well as Crestor  without associated side effects.  Blood pressure today 128/79.  Had loop recorder placed by Dr. Michele 7/6.   Sister jointed visit towards the end of visit - she is concerned regarding worsening depression and living situation as son does not assist with patients care.  She has not had recent visit with PCP.  She is currently on trazodone , venlafaxine , and buspirone   Initial visit 08/19/2020 JM: Ms. Thetford is being seen for hospital follow-up accompanied by her son.  She was discharged home from CIR on 07/15/2020.  Reports residual left sided weakness with numbness/tingling and left lower facial numbness Working with [facility] PT/OT 2x/ week -reports continued improvement since discharge Currently ambulating with rolling walker but she was not using previously -denies any recent falls Lives with her son who will assist as needed but she has been slowly becoming more independent with ADLs Denies new stroke/TIA symptoms  Completed 3 weeks DAPT and remains on Plavix  alone -denies side effects Reports compliance on atorvastatin  40 mg daily -denies side effects Blood pressure today satisfactory 127/78 -monitors at home and typically  stable Glucose levels routinely monitored at home which have been fluctuating per patient report  She has not had follow-up with PCP or cardiology since discharge  No further concerns at this time  Stroke admission 06/25/2020: Ms. MERIAL MORITZ is a 68 y.o. female with history of hypertension, hypercholesteremia, diabetes complicated by neuropathy, CAD, depression C/V prior suicide attempt, arthritis, 10-pack-year history of smoking quit in 2020, COPD/asthma, polysubstance abuse who presented to [facility] ED on 06/25/2020 with slurred speech on and off for past week and left leg weakness.    Personally reviewed hospitalization pertinent progress notes, lab work and imaging with summary provided.  Evaluated by Dr. Jerri with stroke work-up revealing right caudate head and right anterior lateral thalamic infarcts, likely secondary to small vessel disease however given 2 separate locations unable to rule out embolic source with 30-day cardiac event monitor outpatient.  Recommended DAPT for 3 weeks and Plavix  alone.  LDL 86 -increase atorvastatin  from 20 mg to 40 mg daily and ongoing use of fenofibrate  and Vascepa .  Controlled DM with A1c 5.2.  Other stroke risk factors include advanced age, obesity, CAD, hx of cocaine use and former tobacco use.  Residual deficits of left hemiparesis and left hemisensory impairment.  Evaluated by therapies and discharged to CIR for ongoing therapy needs  Stroke: Lacunar infarcts in right caudate head and right anterior lateral thalamus, likely due to small vessel disease. However, given two separate location will need to rule out emoblic source CT head: Lacunar infarcts in the right caudate and right cerebellum, new from 2012, but age indeterminate. MRI : 1 cm acute infarction in the anterolateral thalamus on the right, possibly with some involvement of the posterior limb internal capsule. Acute infarction within the right caudate head.  MRA : Both internal carotid arteries widely  patent through the skull base and siphon regions. No stenosis.  Carotid Doppler: Velocities in the left and right ICA are consistent with a 1-39%  stenosis.  2D Echo: Left ventricular ejection fraction, by estimation, is 55 to 60%.  30 day cardiac event monitoring as outpt to rule out afib LDL 86 HgbA1c 5.2 RPR/HIV neg VTE prophylaxis -Lovenox  30 mg aspirin  81 mg daily prior to admission, now on aspirin  81 mg daily and Plavix  DAPT for 3 weeks, then Plavix  75 mg alone.  Therapy recommendations: CIR Disposition: d/c to CIR on 06/30/2020       ROS:   14 system review of systems performed and negative with exception of those listed in HPI  PMH:  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  GERD (gastroesophageal reflux disease)    with chronic cough   Headache(784.0)    mild   Hypertension    Loop Biotronik loop implant 12/16/2020 12/16/2020   Mental disorder    depression   Neuropathy    feet    Pain    arthritis pain - takes tramadol  as needed   Pulmonary fibrosis, unspecified (HCC)    Rectal cancer (HCC)    Rectal polyp    very little bleeding with bowel movements- no pain   Stroke (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   Suicide attempt (HCC)     PSH:  Past Surgical History:  Procedure Laterality Date   ANAL RECTAL MANOMETRY N/A 07/13/2016   Procedure: ANO RECTAL MANOMETRY;  Surgeon: Bernarda Ned, MD;  Location: [facility];  Service: Endoscopy;  Laterality: N/A;   CESAREAN SECTION     EUS N/A 11/21/2012   Procedure: LOWER ENDOSCOPIC ULTRASOUND (EUS);  Surgeon: Elsie Cree, MD;  Location: [facility];  Service: Endoscopy;  Laterality: N/A;   FLEXIBLE SIGMOIDOSCOPY N/A 11/21/2012   Procedure: FLEXIBLE SIGMOIDOSCOPY;  Surgeon: Elsie Cree, MD;  Location: [facility];  Service: Endoscopy;  Laterality: N/A;    FLEXIBLE SIGMOIDOSCOPY N/A 01/28/2013   Procedure: FLEXIBLE SIGMOIDOSCOPY;  Surgeon: Bernarda Ned, MD;  Location: [facility];  Service: Endoscopy;  Laterality: N/A;   LAPAROSCOPIC LOW ANTERIOR RESECTION N/A 01/29/2013   Procedure: LAPAROSCOPIC LOW ANTERIOR RESECTION, Rigid Proctoscopy;  Surgeon: Bernarda Ned, MD;  Location: [facility];  Service: General;  Laterality: N/A;   LAPAROSCOPIC SIGMOID COLECTOMY N/A 11/14/2012   Procedure: DIAGNOSTIC LAPAROSCOPY AND SIGMOIDMOIDOSCOPY ;  Surgeon: Donnice Bury, MD;  Location: [facility];  Service: General;  Laterality: N/A;   RECTAL ULTRASOUND N/A 07/13/2016   Procedure: RECTAL ULTRASOUND;  Surgeon: Bernarda Ned, MD;  Location: [facility];  Service: Endoscopy;  Laterality: N/A;   TONSILLECTOMY     TONSILLECTOMY AND ADENOIDECTOMY      Social History:  Social History   Socioeconomic History   Marital status: Single    Spouse name: Not on file   Number of children: 1   Years of education: Not on file   Highest education level: Not on file  Occupational History   Not on file  Tobacco Use   Smoking status: Former    Current packs/day: 0.00    Average packs/day: 0.5 packs/day for 20.0 years (10.0 ttl pk-yrs)    Types: Cigarettes    Start date: 01/05/1998    Quit date: 01/05/2018    Years since quitting: 6.2   Smokeless tobacco: Never   Tobacco comments:    Unknown   Vaping Use   Vaping status: Never Used  Substance and Sexual Activity   Alcohol  use: Not Currently   Drug use: Not Currently    Types: Crack cocaine    Comment: Unknown    Sexual activity: Not Currently    Birth control/protection: None  Other Topics Concern   Not on file  Social History Narrative   12/30/20 lives with others   Social Drivers of Health   Financial Resource Strain: Not on file  Food Insecurity: No Food Insecurity (02/23/2024)   Hunger Vital Sign    Worried About Running Out of Food in the Last Year: Never true    Ran Out of Food in the Last Year:  Never true  Transportation Needs: No Transportation Needs (02/23/2024)   PRAPARE - Administrator, Civil Service (Medical): No    Lack of Transportation (Non-Medical): No  Physical Activity: Not on  file  Stress: Not on file  Social Connections: Moderately Isolated (02/23/2024)   Social Connection and Isolation Panel    Frequency of Communication with Friends and Family: Once a week    Frequency of Social Gatherings with Friends and Family: Once a week    Attends Religious Services: 1 to 4 times per year    Active Member of [employer] or Organizations: Yes    Attends Banker Meetings: 1 to 4 times per year    Marital Status: Divorced  Intimate Partner Violence: Not At Risk (02/23/2024)   Humiliation, Afraid, Rape, and Kick questionnaire    Fear of Current or Ex-Partner: No    Emotionally Abused: No    Physically Abused: No    Sexually Abused: No    Family History:  Family History  Problem Relation Age of Onset   Heart disease Mother    Hypertension Mother    Hyperlipidemia Mother    Hypertension Father    Stroke Father    Diabetes Father    Hypertension Sister    Hypertension Sister    Cancer Maternal Aunt        cancer, unknown type    Bone cancer Maternal Uncle    Colon cancer Maternal Uncle    Prostate cancer Maternal Uncle    Prostate cancer Maternal Uncle    Lung cancer Paternal Aunt    Lung cancer Paternal Uncle    Hypertension Brother    Hypertension Brother    Colon cancer Other 47   Breast cancer Neg Hx    BRCA 1/2 Neg Hx     Medications:   Current Outpatient Medications on File Prior to Visit  Medication Sig Dispense Refill   acetaminophen  (TYLENOL ) 325 MG tablet Take 2 tablets (650 mg total) by mouth every 4 (four) hours as needed for mild pain (pain score 1-3) (or temp > 37.5 C (99.5 F)).     albuterol  (VENTOLIN  HFA) 108 (90 Base) MCG/ACT inhaler Inhale 2 puffs into the lungs every 4 (four) hours as needed for wheezing. 6.7 g 5    amLODipine  (NORVASC ) 10 MG tablet Take 1 tablet (10 mg total) by mouth daily. 30 tablet 0   benzoyl peroxide-erythromycin (BENZAMYCIN) gel Apply 1 Application topically every other day.     budesonide -formoterol  (SYMBICORT ) 160-4.5 MCG/ACT inhaler Inhale 2 puffs into the lungs 2 (two) times daily as needed (Asthma).     cilostazol  (PLETAL ) 100 MG tablet Take 1 tablet (100 mg total) by mouth 2 (two) times daily. 60 tablet 0   clindamycin  (CLEOCIN  T) 1 % lotion Apply 1 Application topically daily as needed (Irritation/Acne).     clopidogrel  (PLAVIX ) 75 MG tablet Take 1 tablet (75 mg total) by mouth daily. 30 tablet 0   dapagliflozin  propanediol (FARXIGA ) 10 MG TABS tablet Take 1 tablet (10 mg total) by mouth daily. 30 tablet 0   diclofenac  Sodium (VOLTAREN ) 1 % GEL Apply 1 Application topically 2 (two) times daily as needed (pain). 100 g 0   ezetimibe  (ZETIA ) 10 MG tablet Take 1 tablet (10 mg total) by mouth daily. 30 tablet 0   gabapentin  (NEURONTIN ) 100 MG capsule Take 1 capsule (100 mg total) by mouth 3 (three) times daily. (Patient taking differently: Take 100 mg by mouth daily as needed (Pain).) 90 capsule 0   hydrALAZINE  (APRESOLINE ) 25 MG tablet Take 37.5 mg by mouth 3 (three) times daily.     insulin  glargine (LANTUS ) 100 UNIT/ML Solostar Pen Inject 10 Units into the  skin daily. Pen expires 28 days after first use (Patient taking differently: Inject 18-24 Units into the skin daily. Inject 24 units into the skin every morning and 18 units at night.) 15 mL 11   metroNIDAZOLE  (METROCREAM ) 0.75 % cream Apply 1 Application topically every other day.     naphazoline-glycerin  (CLEAR EYES REDNESS) 0.012-0.25 % SOLN Place 1-2 drops into both eyes 3 (three) times daily with meals. (Patient taking differently: Place 2 drops into both eyes daily as needed for eye irritation.)     nystatin  (MYCOSTATIN ) 100000 UNIT/ML suspension Take 5 mLs by mouth in the morning, at noon, and at bedtime.     pantoprazole   (PROTONIX ) 40 MG tablet Take 1 tablet (40 mg total) by mouth daily. 30 tablet 0   Pirfenidone  801 MG TABS TAKE 1 TABLET (801 MG) BY MOUTH WITH BREAKFAST, WITH LUNCH, AND WITH EVENING MEAL. (Patient not taking: Reported on 02/23/2024) 270 tablet 0   rosuvastatin  (CRESTOR ) 40 MG tablet Take 1 tablet (40 mg total) by mouth daily. 30 tablet 0   sertraline  (ZOLOFT ) 50 MG tablet Take 1 tablet (50 mg total) by mouth daily. 30 tablet 0   topiramate  (TOPAMAX ) 25 MG tablet Take 1 tablet (25 mg total) by mouth 2 (two) times daily. 60 tablet 8   venlafaxine  XR (EFFEXOR -XR) 150 MG 24 hr capsule Take 1 capsule (150 mg total) by mouth daily. (Patient not taking: Reported on 02/23/2024) 30 capsule 0   No current facility-administered medications on file prior to visit.    Allergies:   Allergies  Allergen Reactions   Penicillins Hives, Itching and Swelling    Tongue swelling      OBJECTIVE:  Physical Exam  There were no vitals filed for this visit.  There is no height or weight on file to calculate BMI. No results found.  General: well developed, well nourished,  very pleasant elderly African-American female, seated, in no evident distress  Neurologic Exam Mental Status: Awake and fully alert.  Mild dysarthria with speech hesitancy.  No evidence of aphasia.  Oriented to place and time. Recent memory impaired and remote memory intact. Attention span, concentration and fund of knowledge appropriate during visit. Mood and affect appropriate.  Cranial Nerves: Pupils equal, briskly reactive to light. Extraocular movements full without nystagmus. Visual fields full to confrontation. Hearing intact.  Decreased light touch left lower facial sensation.  Slight left lower facial weakness.  Tongue and palate moves normally and symmetrically.  Motor: Normal bulk and tone and normal strength on the right side.  4/5 left hemiparesis although questionable effort and some giveaway weakness Sensory.:  subjective odd  feeling with light touch LUE and LLE compared to right side Coordination: Rapid alternating movements impaired L>R.  Gait and Station: Arises from chair without difficulty. Stance is normal. Gait demonstrates slightly decreased step height of LLE with use of cane, mild unsteadiness initially.  Unable to complete tandem walk or heel toe.   Reflexes: 1+ and symmetric. Toes downgoing.          ASSESSMENT: Erica [name] is a 68 y.o. year old female with multiple recurrent strokes,   02/2024  right cerebellum, thalamus and frontal cortex, unclear cause, SVD vs cardioembolic source  10/7972 left thalamic stroke likely secondary to small vessel disease 03/2023 left thalamic infarct likely secondary to small vessel disease,  02/2023 bilateral centrum semiovale infarcts 06/2020 and 10/2020 recurrent right MCA strokes llikely in setting of small vessel disease although unable to rule out cardioembolic source  s/p Loop recorder 12/2020 by Dr. Michele [facility] cardiology).    Vascular risk factors include HTN, HLD, DM, CAD, history of tobacco use and history of polysubstance abuse and multiple prior strokes noted on imaging.       PLAN:  History of multiple strokes:  Residual deficit: right facial numbness/pain and swallowing difficulty since recent stroke. Chronic mild left hemiparesis, gait impairment, dysarthria, and short term memory difficulties.  Advised to complete [facility] PT, consider outpatient PT if needed once completed Advised to schedule f/u visit with SLP upon leaving building today for swallowing concerns Continued use of cane at all times for fall prevention  Continue Plavix , Zetia  and Crestor  for secondary stroke prevention managed/prescribed by PCP.  Advised to discontinue aspirin  as prolonged DAPT not indicated from stroke standpoint. S/p ILR 12/16/2020 - monitored by [facility] cardiology Dr. Michele.  No evidence of A-fib thus far Hypercoag workup negative for underlying clotting  disorder discussed secondary stroke prevention measures and importance of close PCP follow up for aggressive stroke risk factor management including BP goal<130/90, HLD with LDL goal<70 and DM with A1c.<7  Stroke labs 09/2023 LDL 78, A1c 5.6  Tension type headaches: Present over the past 2 weeks, no red flag symptoms and neuro exam intact therefore will hold off on repeat imaging at this time but can consider if headaches persist. Patient agrees with plan.  Recommend starting topiramate  25 mg twice daily.  Discussed potential side effects and to call with any difficulty tolerating.  Consider increasing dosage after 2 to 3 weeks if needed.  Advised this may also help with right sided facial pain likely post stroke.  Advised to limit use of OTC pain relieves as this can cause rebound headache Currently on gabapentin  and venlafaxine   At risk for sleep apnea: has evaluation with pulmonology 12/12/2023. Discussed risk of untreated sleep apnea and importance of pursing this evaluation.      Follow-up in 4 months or call earlier if needed    CC:  PCP: Delores Rojelio Caldron, NP    I personally spent a total of 55 minutes in the care of the patient today including preparing to see the patient, getting/reviewing separately obtained history, performing a medically appropriate exam/evaluation, counseling and educating, placing orders, and documenting clinical information in the EHR. Time also spend reviewing recent hospitalization.  Harlene Bogaert, AGNP-BC  [facility] [address] [place], [place] [address]  Phone [phone] Fax [phone] Note: This document was prepared with digital dictation and possible smart phrase technology. Any transcriptional errors that result from this process are unintentional.

## 2024-04-03 NOTE — Telephone Encounter (Signed)
 [facility] conf

## 2024-04-04 ENCOUNTER — Encounter

## 2024-04-04 ENCOUNTER — Ambulatory Visit: Admitting: *Deleted

## 2024-04-04 ENCOUNTER — Ambulatory Visit (INDEPENDENT_AMBULATORY_CARE_PROVIDER_SITE_OTHER): Admitting: Internal Medicine

## 2024-04-04 ENCOUNTER — Ambulatory Visit: Admitting: Internal Medicine

## 2024-04-04 ENCOUNTER — Ambulatory Visit: Admitting: Adult Health

## 2024-04-04 ENCOUNTER — Encounter: Payer: Self-pay | Admitting: Adult Health

## 2024-04-04 ENCOUNTER — Encounter: Payer: Self-pay | Admitting: Internal Medicine

## 2024-04-04 VITALS — BP 116/68 | HR 63 | Temp 97.8°F | Ht 64.0 in | Wt 159.0 lb

## 2024-04-04 VITALS — BP 118/76 | HR 72 | Ht 63.0 in | Wt 160.0 lb

## 2024-04-04 DIAGNOSIS — J84112 Idiopathic pulmonary fibrosis: Secondary | ICD-10-CM

## 2024-04-04 DIAGNOSIS — I69398 Other sequelae of cerebral infarction: Secondary | ICD-10-CM

## 2024-04-04 DIAGNOSIS — R269 Unspecified abnormalities of gait and mobility: Secondary | ICD-10-CM

## 2024-04-04 DIAGNOSIS — I69322 Dysarthria following cerebral infarction: Secondary | ICD-10-CM

## 2024-04-04 DIAGNOSIS — J438 Other emphysema: Secondary | ICD-10-CM

## 2024-04-04 DIAGNOSIS — I69391 Dysphagia following cerebral infarction: Secondary | ICD-10-CM | POA: Diagnosis not present

## 2024-04-04 DIAGNOSIS — R55 Syncope and collapse: Secondary | ICD-10-CM

## 2024-04-04 DIAGNOSIS — I69319 Unspecified symptoms and signs involving cognitive functions following cerebral infarction: Secondary | ICD-10-CM | POA: Diagnosis not present

## 2024-04-04 DIAGNOSIS — Z8673 Personal history of transient ischemic attack (TIA), and cerebral infarction without residual deficits: Secondary | ICD-10-CM

## 2024-04-04 DIAGNOSIS — I69354 Hemiplegia and hemiparesis following cerebral infarction affecting left non-dominant side: Secondary | ICD-10-CM

## 2024-04-04 LAB — PULMONARY FUNCTION TEST
DL/VA % pred: 50 %
DL/VA: 2.08 ml/min/mmHg/L
DLCO cor % pred: 42 %
DLCO cor: 8.24 ml/min/mmHg
DLCO unc % pred: 41 %
DLCO unc: 8.08 ml/min/mmHg
FEF 25-75 Pre: 1.78 L/s
FEF2575-%Pred-Pre: 91 %
FEV1-%Pred-Pre: 93 %
FEV1-Pre: 2.15 L
FEV1FVC-%Pred-Pre: 98 %
FEV6-%Pred-Pre: 97 %
FEV6-Pre: 2.82 L
FEV6FVC-%Pred-Pre: 103 %
FVC-%Pred-Pre: 94 %
FVC-Pre: 2.87 L
Pre FEV1/FVC ratio: 75 %
Pre FEV6/FVC Ratio: 99 %

## 2024-04-04 MED ORDER — TOPIRAMATE 25 MG PO TABS: 25.0000 mg | ORAL_TABLET | Freq: Two times a day (BID) | ORAL | 8 refills | Status: AC

## 2024-04-04 MED ORDER — CLOPIDOGREL BISULFATE 75 MG PO TABS: 75.0000 mg | ORAL_TABLET | Freq: Every day | ORAL | 1 refills | Status: AC

## 2024-04-04 NOTE — Progress Notes (Signed)
 Spirometry and diffusion capacity performed today.
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
## 2024-04-04 NOTE — Patient Instructions (Addendum)
 ICD-10-CM   1. IPF (idiopathic pulmonary fibrosis) (HCC)  J84.112     2. Other emphysema (HCC)  J43.8          Clinically pulmonary fibrosis is stable over time Your overall frailty with recent strokes has prevented you from   - taking pirfenidone  which was originally prescribed - And also keeping up with follow-up   Plan - Given overall frailty and stable lung disease I recommend supportive care - Do high-resolution CT chest in 6 months for monitoring  Follow-up -Return in 6 months to see nurse practitioner in 6 months but after CT

## 2024-04-04 NOTE — Patient Instructions (Signed)
 Spirometry and diffusion capacity performed today.

## 2024-04-04 NOTE — Patient Instructions (Addendum)
 Please ensure you are taking topamax  25mg  twice daily which can help with headache prevention   You will be called to schedule evaluation with speech therapy for speech, swallowing and memory  You will be scheduled to repeat EEG, we will also check lab work today, please call with any recurrent episodes of passing out and ensure you follow up with your PCP  restart clopidogrel  75 mg daily and continue Pletal  100mg  twice daily and continue Crestor  and Zetia   for secondary stroke prevention  Continue to follow up with PCP regarding cholesterol and blood pressure management  Maintain strict control of hypertension with blood pressure goal below 130/90, diabetes with hemoglobin A1c goal below 7.0 % and cholesterol with LDL cholesterol (bad cholesterol) goal below 70 mg/dL.   Signs of a Stroke? Follow the BEFAST method:  Balance Watch for a sudden loss of balance, trouble with coordination or vertigo Eyes Is there a sudden loss of vision in one or both eyes? Or double vision?  Face: Ask the person to smile. Does one side of the face droop or is it numb?  Arms: Ask the person to raise both arms. Does one arm drift downward? Is there weakness or numbness of a leg? Speech: Ask the person to repeat a simple phrase. Does the speech sound slurred/strange? Is the person confused ? Time: If you observe any of these signs, call 911.      Followup in the future with me in 3-4 months or call earlier if needed       Thank you for coming to see us  at [facility]. I hope we have been able to provide you high quality care today.  You may receive a patient satisfaction survey over the next few weeks. We would appreciate your feedback and comments so that we may continue to improve ourselves and the health of our patients.

## 2024-04-05 LAB — BASIC METABOLIC PANEL WITH GFR
BUN/Creatinine Ratio: 18 (ref 12–28)
BUN: 35 mg/dL — ABNORMAL HIGH (ref 8–27)
CO2: 18 mmol/L — ABNORMAL LOW (ref 20–29)
Calcium: 8.7 mg/dL (ref 8.7–10.3)
Chloride: 108 mmol/L — ABNORMAL HIGH (ref 96–106)
Creatinine, Ser: 1.97 mg/dL — ABNORMAL HIGH (ref 0.57–1.00)
Glucose: 228 mg/dL — ABNORMAL HIGH (ref 70–99)
Potassium: 5.2 mmol/L (ref 3.5–5.2)
Sodium: 139 mmol/L (ref 134–144)
eGFR: 27 mL/min/1.73 — ABNORMAL LOW (ref >59)

## 2024-04-08 ENCOUNTER — Ambulatory Visit: Payer: Self-pay | Admitting: Adult Health

## 2024-04-08 ENCOUNTER — Ambulatory Visit: Admitting: Physical Therapy

## 2024-04-08 NOTE — Telephone Encounter (Signed)
-----   Message from Harlene Bogaert sent at 04/08/2024  2:08 PM EDT ----- Please call patient per request (doesn't use mychart), her lab work was overall stable compared to her baseline. Please continue to follow with PCP for ongoing monitoring. Thank you.  ----- Message ----- From: Rebecka Memos Lab Results In Sent: 04/05/2024   5:37 AM EDT To: Harlene Bogaert, NP

## 2024-04-08 NOTE — Telephone Encounter (Signed)
 Called pt at [phone]. Received automated message that call could not be completed, unable to LVM.    Called sister/on DPR at [phone]. LVM.

## 2024-04-10 ENCOUNTER — Encounter: Admitting: Speech Pathology

## 2024-04-10 ENCOUNTER — Ambulatory Visit: Admitting: Physical Therapy

## 2024-04-15 ENCOUNTER — Encounter: Payer: Self-pay | Admitting: Physician Assistant

## 2024-04-15 ENCOUNTER — Encounter: Payer: Self-pay | Admitting: *Deleted

## 2024-04-15 NOTE — Telephone Encounter (Signed)
 Called and could not LM for results.  Mailed letter.

## 2024-04-15 NOTE — Telephone Encounter (Signed)
-----   Message from Harlene Bogaert sent at 04/08/2024  2:08 PM EDT ----- Please call patient per request (doesn't use mychart), her lab work was overall stable compared to her baseline. Please continue to follow with PCP for ongoing monitoring. Thank you.  ----- Message ----- From: Rebecka Memos Lab Results In Sent: 04/05/2024   5:37 AM EDT To: Harlene Bogaert, NP

## 2024-04-16 ENCOUNTER — Encounter: Payer: Self-pay | Admitting: *Deleted

## 2024-04-16 ENCOUNTER — Other Ambulatory Visit: Admitting: *Deleted

## 2024-04-19 LAB — CUP PACEART REMOTE DEVICE CHECK
Date Time Interrogation Session: 20251106090549
Implantable Pulse Generator Implant Date: 20220706
Pulse Gen Model: 436066
Pulse Gen Serial Number: 94069553

## 2024-04-22 ENCOUNTER — Ambulatory Visit: Payer: Self-pay

## 2024-04-22 ENCOUNTER — Ambulatory Visit: Payer: Self-pay | Admitting: Cardiology

## 2024-04-22 DIAGNOSIS — I639 Cerebral infarction, unspecified: Secondary | ICD-10-CM | POA: Diagnosis not present

## 2024-04-23 ENCOUNTER — Other Ambulatory Visit: Admitting: *Deleted

## 2024-04-23 NOTE — Progress Notes (Signed)
 Remote Loop Recorder Transmission

## 2024-04-24 ENCOUNTER — Other Ambulatory Visit: Payer: Self-pay | Admitting: Nurse Practitioner

## 2024-04-24 DIAGNOSIS — W19XXXA Unspecified fall, initial encounter: Secondary | ICD-10-CM

## 2024-04-25 ENCOUNTER — Ambulatory Visit
Admission: RE | Admit: 2024-04-25 | Discharge: 2024-04-25 | Disposition: A | Discharge status: Home / Self Care | Source: Ambulatory Visit | Attending: Nurse Practitioner | Admitting: Nurse Practitioner

## 2024-04-25 DIAGNOSIS — W19XXXA Unspecified fall, initial encounter: Secondary | ICD-10-CM

## 2024-04-30 ENCOUNTER — Other Ambulatory Visit: Admitting: *Deleted

## 2024-05-13 ENCOUNTER — Encounter: Payer: Self-pay | Admitting: *Deleted

## 2024-05-13 ENCOUNTER — Other Ambulatory Visit: Admitting: *Deleted

## 2024-05-17 ENCOUNTER — Telehealth: Payer: Self-pay | Admitting: Internal Medicine

## 2024-05-17 MED ORDER — ALBUTEROL SULFATE HFA 108 (90 BASE) MCG/ACT IN AERS: 2.0000 | INHALATION_SPRAY | RESPIRATORY_TRACT | 5 refills | Status: AC | PRN

## 2024-05-17 NOTE — Telephone Encounter (Signed)
 Called patient and sent in medication to pharmacy.NFN

## 2024-05-17 NOTE — Telephone Encounter (Signed)
 Copied from CRM #[id number]. Topic: Clinical - Medication Refill >> May 17, 2024  8:52 AM Joesph PARAS wrote: Medication:  albuterol  (VENTOLIN  HFA) 108 (90 Base) MCG/ACT inhaler  Has the patient contacted their pharmacy? Yes  This is the patient's preferred pharmacy:   [id number] - [place], [place] - [address] AT [facility] [address] [place] [address] Phone: [phone] Fax: [phone]  Is this the correct pharmacy for this prescription? Yes If no, delete pharmacy and type the correct one.   Has the prescription been filled recently? No  Is the patient out of the medication? Yes  Has the patient been seen for an appointment in the last year OR does the patient have an upcoming appointment? Yes  Can we respond through MyChart? Yes  Agent: Please be advised that Rx refills may take up to 3 business days. We ask that you follow-up with your pharmacy.

## 2024-05-23 ENCOUNTER — Telehealth: Payer: Self-pay | Admitting: Adult Health

## 2024-05-23 ENCOUNTER — Ambulatory Visit: Payer: Self-pay

## 2024-05-23 DIAGNOSIS — I639 Cerebral infarction, unspecified: Secondary | ICD-10-CM | POA: Diagnosis not present

## 2024-05-23 LAB — CUP PACEART REMOTE DEVICE CHECK
Date Time Interrogation Session: 20251211071830
Implantable Pulse Generator Implant Date: 20220706
Pulse Gen Model: 436066
Pulse Gen Serial Number: 94069553

## 2024-05-23 NOTE — Telephone Encounter (Signed)
 Attempted to call pt, phone rang a few time then recording stated call could not be completed. Sent mychart msg informing pt of need to reschedule 07/25/24 appt - NP out

## 2024-05-30 ENCOUNTER — Ambulatory Visit: Admitting: Physician Assistant

## 2024-05-30 NOTE — Progress Notes (Signed)
 Remote Loop Recorder Transmission

## 2024-05-30 NOTE — Progress Notes (Deleted)
 05/30/2024 Erica [name] [id number] [birth year]  Referring provider: Delores Rojelio Caldron, NP Primary GI doctor: {[id number]}  ASSESSMENT AND PLAN:  History of colorectal cancer Diagnosed April 2014 large rectal polyp showed high-grade dysplasia Status post low anterior resection Dr. 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 Incidental finding on CT abdomen and pelvis revealed hypoattenuating 1.3 cm splenic structure; recommended follow up with MRI of abdomen with contrast   Oropharyngeal dysphagia July 2025 with mild impairments noted for oral phase of swallow efficiency. Recommended dysphagia 3 diet with thin liquids   Patient Care Team: Delores Rojelio Caldron, NP as PCP - General (Nurse Practitioner) Michele Richardson, DO as PCP - Cardiology (Cardiology) Lanny Callander, MD as Consulting Physician (Hematology) [name] Hila, MD as Consulting Physician (General Surgery) Burnette Fallow, MD as Consulting Physician (Gastroenterology)  HISTORY OF PRESENT ILLNESS: 68 y.o. female with a past medical history listed below presents for evaluation of colonoscopy.     *** Discussed the use of AI scribe software for clinical note transcription with the patient, who gave verbal consent to proceed.  History of Present Illness            She  reports that she quit smoking about 6 years ago. Her smoking use included cigarettes. She started smoking about 26 years ago. She has a 10 pack-year smoking history. She has never used smokeless tobacco. She reports that she does not currently use alcohol . She reports that she does not currently use drugs after having used the following drugs: Crack cocaine.  RELEVANT GI HISTORY, IMAGING AND LABS: Results          CBC    Component Value Date/Time   WBC 3.8 (L) 02/24/2024 0546   RBC 4.03 02/24/2024  0546   HGB 12.8 02/24/2024 0546   HGB 10.7 (L) 06/21/2023 0841   HGB 12.5 03/02/2017 1428   HCT 35.9 (L) 02/24/2024 0546   HCT 35.4 03/02/2017 1428   PLT 151 02/24/2024 0546   PLT 157 06/21/2023 0841   PLT 192 03/02/2017 1428   MCV 89.1 02/24/2024 0546   MCV 94.3 03/02/2017 1428   MCH 31.8 02/24/2024 0546   MCHC 35.7 02/24/2024 0546   RDW 15.9 (H) 02/24/2024 0546   RDW 14.6 (H) 03/02/2017 1428   LYMPHSABS 1.4 02/22/2024 2250   LYMPHSABS 1.1 03/02/2017 1428   MONOABS 0.5 02/22/2024 2250   MONOABS 0.4 03/02/2017 1428   EOSABS 0.0 02/22/2024 2250   EOSABS 0.1 03/02/2017 1428   BASOSABS 0.0 02/22/2024 2250   BASOSABS 0.1 03/02/2017 1428   Recent Labs    09/25/23 1126 09/25/23 1130 09/26/23 0340 09/27/23 0903 09/28/23 1713 09/29/23 0533 02/22/24 2250 02/22/24 2305 02/23/24 0749 02/24/24 0546  HGB 12.0 11.2* 11.7* 12.1 11.8* 11.6* 13.8 13.3 11.2* 12.8    CMP     Component Value Date/Time   NA 139 04/04/2024 1345   NA 131 (L) 03/02/2017 1428   K 5.2 04/04/2024 1345   K 4.8 03/02/2017 1428   CL 108 (H) 04/04/2024 1345   CO2 18 (L) 04/04/2024 1345   CO2 24 03/02/2017 1428   GLUCOSE 228 (H) 04/04/2024 1345   GLUCOSE 179 (H) 02/24/2024 0546   GLUCOSE 482 (H) 03/02/2017 1428   BUN 35 (H) 04/04/2024 1345   BUN 20.0 03/02/2017 1428   CREATININE 1.97 (H) 04/04/2024 1345  CREATININE 1.62 (H) 06/21/2023 0841   CREATININE 1.90 (H) 01/12/2022 1218   CREATININE 1.7 (H) 03/02/2017 1428   CALCIUM  8.7 04/04/2024 1345   CALCIUM  9.2 03/02/2017 1428   PROT 6.9 02/24/2024 0546   PROT 7.5 03/08/2021 1144   PROT 8.0 03/02/2017 1428   ALBUMIN 3.6 02/24/2024 0546   ALBUMIN 4.3 03/08/2021 1144   ALBUMIN 3.2 (L) 03/02/2017 1428   AST 18 02/24/2024 0546   AST 14 (L) 06/21/2023 0841   AST 12 03/02/2017 1428   ALT 15 02/24/2024 0546   ALT 8 06/21/2023 0841   ALT 8 03/02/2017 1428   ALKPHOS 80 02/24/2024 0546   ALKPHOS 139 03/02/2017 1428   BILITOT 0.8 02/24/2024 0546   BILITOT  0.8 06/21/2023 0841   BILITOT 0.99 03/02/2017 1428   GFRNONAA 30 (L) 02/24/2024 0546   GFRNONAA 34 (L) 06/21/2023 0841   GFRNONAA 36 (L) 11/30/2020 1505   GFRAA 42 (L) 11/30/2020 1505      Latest Ref Rng & Units 02/24/2024    5:46 AM 02/22/2024   10:50 PM 09/29/2023    5:33 AM  Hepatic Function  Total Protein 6.5 - 8.1 g/dL 6.9  7.2  6.4   Albumin 3.5 - 5.0 g/dL 3.6  3.7  3.2   AST 15 - 41 U/L 18  19  18    ALT 0 - 44 U/L 15  15  11    Alk Phosphatase 38 - 126 U/L 80  92  76   Total Bilirubin 0.0 - 1.2 mg/dL 0.8  1.1  0.8       Current Medications:   Current Outpatient Medications (Endocrine & Metabolic):    dapagliflozin  propanediol (FARXIGA ) 10 MG TABS tablet, Take 1 tablet (10 mg total) by mouth daily.   insulin  glargine (LANTUS ) 100 UNIT/ML Solostar Pen, Inject 10 Units into the skin daily. Pen expires 28 days after first use  Current Outpatient Medications (Cardiovascular):    amLODipine  (NORVASC ) 10 MG tablet, Take 1 tablet (10 mg total) by mouth daily.   ezetimibe  (ZETIA ) 10 MG tablet, Take 1 tablet (10 mg total) by mouth daily.   hydrALAZINE  (APRESOLINE ) 25 MG tablet, Take 37.5 mg by mouth 3 (three) times daily.   rosuvastatin  (CRESTOR ) 40 MG tablet, Take 1 tablet (40 mg total) by mouth daily.  Current Outpatient Medications (Respiratory):    albuterol  (VENTOLIN  HFA) 108 (90 Base) MCG/ACT inhaler, Inhale 2 puffs into the lungs every 4 (four) hours as needed for wheezing.   budesonide -formoterol  (SYMBICORT ) 160-4.5 MCG/ACT inhaler, Inhale 2 puffs into the lungs 2 (two) times daily as needed (Asthma).  Current Outpatient Medications (Analgesics):    acetaminophen  (TYLENOL ) 325 MG tablet, Take 2 tablets (650 mg total) by mouth every 4 (four) hours as needed for mild pain (pain score 1-3) (or temp > 37.5 C (99.5 F)).  Current Outpatient Medications (Hematological):    cilostazol  (PLETAL ) 100 MG tablet, Take 1 tablet (100 mg total) by mouth 2 (two) times daily.   clopidogrel   (PLAVIX ) 75 MG tablet, Take 1 tablet (75 mg total) by mouth daily.  Current Outpatient Medications (Other):    benzoyl peroxide-erythromycin (BENZAMYCIN) gel, Apply 1 Application topically every other day.   clindamycin  (CLEOCIN  T) 1 % lotion, Apply 1 Application topically daily as needed (Irritation/Acne).   diclofenac  Sodium (VOLTAREN ) 1 % GEL, Apply 1 Application topically 2 (two) times daily as needed (pain).   gabapentin  (NEURONTIN ) 100 MG capsule, Take 1 capsule (100 mg total) by mouth 3 (three) times  daily.   ketoconazole (NIZORAL) 2 % cream, SMARTSIG:1 Application Topical 1 to 2 Times Daily   metroNIDAZOLE  (METROCREAM ) 0.75 % cream, Apply 1 Application topically every other day.   naphazoline-glycerin  (CLEAR EYES REDNESS) 0.012-0.25 % SOLN, Place 1-2 drops into both eyes 3 (three) times daily with meals.   pantoprazole  (PROTONIX ) 40 MG tablet, Take 1 tablet (40 mg total) by mouth daily.   sertraline  (ZOLOFT ) 50 MG tablet, Take 1 tablet (50 mg total) by mouth daily.   topiramate  (TOPAMAX ) 25 MG tablet, Take 1 tablet (25 mg total) by mouth 2 (two) times daily.  Medical History:  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   GERD (gastroesophageal reflux disease)    with chronic cough   Headache(784.0)    mild   Hypertension    Loop Biotronik loop implant 12/16/2020 12/16/2020   Mental disorder    depression   Neuropathy    feet    Pain    arthritis pain - takes tramadol  as needed   Pulmonary fibrosis, unspecified (HCC)    Rectal cancer (HCC)    Rectal polyp 2014   very little bleeding with bowel movements- no pain   Stroke (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   Suicide attempt (HCC)    Allergies: Allergies[1]   Surgical History:  She  has a past surgical history that includes Tonsillectomy and adenoidectomy; Cesarean  section; Tonsillectomy; Laparoscopic sigmoid colectomy (N/A, 11/14/2012); EUS (N/A, 11/21/2012); Flexible sigmoidoscopy (N/A, 11/21/2012); Flexible sigmoidoscopy (N/A, 01/28/2013); Laparoscopic low anterior resection (N/A, 01/29/2013); Rectal ultrasound (N/A, 07/13/2016); and Anal Rectal manometry (N/A, 07/13/2016). Family History:  Her family history includes Bone cancer in her maternal uncle; Cancer in her maternal aunt; Colon cancer in her maternal uncle; Colon cancer (age of onset: 46) in an other family member; Diabetes in her father; Heart disease in her mother; Hyperlipidemia in her mother; Hypertension in her brother, brother, father, mother, sister, and sister; Lung cancer in her paternal aunt and paternal uncle; Prostate cancer in her maternal uncle and maternal uncle; Stroke in her father.  REVIEW OF SYSTEMS  : All other systems reviewed and negative except where noted in the History of Present Illness.  PHYSICAL EXAM: There were no vitals taken for this visit. Physical Exam          Alan JONELLE Coombs, PA-C 7:56 AM      [1]  Allergies Allergen Reactions   Penicillins Hives, Itching and Swelling    Tongue swelling

## 2024-06-07 ENCOUNTER — Emergency Department (HOSPITAL_COMMUNITY)

## 2024-06-07 ENCOUNTER — Other Ambulatory Visit: Payer: Self-pay

## 2024-06-07 ENCOUNTER — Emergency Department (HOSPITAL_COMMUNITY)
Admission: EM | Admit: 2024-06-07 | Discharge: 2024-06-07 | Disposition: A | Discharge status: Home / Self Care | Attending: Emergency Medicine | Admitting: Emergency Medicine

## 2024-06-07 ENCOUNTER — Encounter (HOSPITAL_COMMUNITY): Payer: Self-pay

## 2024-06-07 DIAGNOSIS — R079 Chest pain, unspecified: Secondary | ICD-10-CM | POA: Diagnosis present

## 2024-06-07 LAB — CBG MONITORING, ED
Glucose-Capillary: 65 mg/dL — ABNORMAL LOW (ref 70–99)
Glucose-Capillary: 113 mg/dL — ABNORMAL HIGH (ref 70–99)

## 2024-06-07 LAB — BASIC METABOLIC PANEL WITH GFR
Anion gap: 12 (ref 5–15)
BUN: 20 mg/dL (ref 8–23)
CO2: 18 mmol/L — ABNORMAL LOW (ref 22–32)
Calcium: 8.9 mg/dL (ref 8.9–10.3)
Chloride: 112 mmol/L — ABNORMAL HIGH (ref 98–111)
Creatinine, Ser: 1.54 mg/dL — ABNORMAL HIGH (ref 0.44–1.00)
GFR, Estimated: 36 mL/min — ABNORMAL LOW
Glucose, Bld: 67 mg/dL — ABNORMAL LOW (ref 70–99)
Potassium: 3.7 mmol/L (ref 3.5–5.1)
Sodium: 141 mmol/L (ref 135–145)

## 2024-06-07 LAB — TROPONIN T, HIGH SENSITIVITY
Troponin T High Sensitivity: 28 ng/L — ABNORMAL HIGH (ref 0–19)
Troponin T High Sensitivity: 26 ng/L — ABNORMAL HIGH (ref 0–19)

## 2024-06-07 LAB — CBC
HCT: 29.1 % — ABNORMAL LOW (ref 36.0–46.0)
Hemoglobin: 10.5 g/dL — ABNORMAL LOW (ref 12.0–15.0)
MCH: 33.9 pg (ref 26.0–34.0)
MCHC: 36.1 g/dL — ABNORMAL HIGH (ref 30.0–36.0)
MCV: 93.9 fL (ref 80.0–100.0)
Platelets: 144 K/uL — ABNORMAL LOW (ref 150–400)
RBC: 3.1 MIL/uL — ABNORMAL LOW (ref 3.87–5.11)
RDW: 16.5 % — ABNORMAL HIGH (ref 11.5–15.5)
WBC: 3.8 K/uL — ABNORMAL LOW (ref 4.0–10.5)
nRBC: 0 % (ref 0.0–0.2)

## 2024-06-07 NOTE — Discharge Instructions (Addendum)
 You have been seen and discharged from the emergency department.  Your heart workup appeared stable for you.  We consulted cardiology today.  An ambulatory referral has been placed for them to get you into the office for further evaluation and testing.  Follow-up with your primary provider for further evaluation and further care. Take home medications as prescribed. If you have any worsening symptoms, return of chest pain or further concerns for your health please return to an emergency department for further evaluation.

## 2024-06-07 NOTE — ED Provider Notes (Signed)
 " [facility] Provider Note   CSN: [id number] Arrival date & time: 06/07/24  1503     Patient presents with: No chief complaint on file.   Erica [name] is a 68 y.o. female.   HPI   68 year old female presents to the emergency department with concern for episode of chest discomfort.  Patient states that she had chest discomfort that was intermittent for the past week.  Last episode of pain was about 3 days ago.  She states that it was midsternal, associated with mild shortness of breath.  The pain self resolved and she has been pain-free for the past 3 days.  States that she was at her doctor's office when she revealed this information and they sent her here for further evaluation.  Currently she is symptom-free.  No other acute changes in her health recently.  Prior to Admission medications  Medication Sig Start Date End Date Taking? Authorizing Provider  acetaminophen  (TYLENOL ) 325 MG tablet Take 2 tablets (650 mg total) by mouth every 4 (four) hours as needed for mild pain (pain score 1-3) (or temp > 37.5 C (99.5 F)). 09/28/23   Elgergawy, Brayton RAMAN, MD  albuterol  (VENTOLIN  HFA) 108 (90 Base) MCG/ACT inhaler Inhale 2 puffs into the lungs every 4 (four) hours as needed for wheezing. 05/17/24   Geronimo Amel, MD  amLODipine  (NORVASC ) 10 MG tablet Take 1 tablet (10 mg total) by mouth daily. 10/09/23   Angiulli, Daniel J, PA-C  benzoyl peroxide-erythromycin [facility]) gel Apply 1 Application topically every other day.    [provider]  budesonide -formoterol  (SYMBICORT ) 160-4.5 MCG/ACT inhaler Inhale 2 puffs into the lungs 2 (two) times daily as needed (Asthma).    [provider]  cilostazol  (PLETAL ) 100 MG tablet Take 1 tablet (100 mg total) by mouth 2 (two) times daily. 02/25/24   Tobie Gaines, DO  clindamycin  (CLEOCIN  T) 1 % lotion Apply 1 Application topically daily as needed (Irritation/Acne).    [provider]   clopidogrel  (PLAVIX ) 75 MG tablet Take 1 tablet (75 mg total) by mouth daily. 04/04/24   Whitfield Raisin, NP  dapagliflozin  propanediol (FARXIGA ) 10 MG TABS tablet Take 1 tablet (10 mg total) by mouth daily. 10/09/23   Angiulli, Daniel J, PA-C  diclofenac  Sodium (VOLTAREN ) 1 % GEL Apply 1 Application topically 2 (two) times daily as needed (pain). 10/09/23   Angiulli, Toribio PARAS, PA-C  ezetimibe  (ZETIA ) 10 MG tablet Take 1 tablet (10 mg total) by mouth daily. 10/09/23   Angiulli, Toribio PARAS, PA-C  gabapentin  (NEURONTIN ) 100 MG capsule Take 1 capsule (100 mg total) by mouth 3 (three) times daily. 10/09/23   Angiulli, Toribio PARAS, PA-C  hydrALAZINE  (APRESOLINE ) 25 MG tablet Take 37.5 mg by mouth 3 (three) times daily.    [provider]  insulin  glargine (LANTUS ) 100 UNIT/ML Solostar Pen Inject 10 Units into the skin daily. Pen expires 28 days after first use 10/09/23   Angiulli, Daniel J, PA-C  ketoconazole (NIZORAL) 2 % cream SMARTSIG:1 Application Topical 1 to 2 Times Daily 04/01/24   [provider]  metroNIDAZOLE  (METROCREAM ) 0.75 % cream Apply 1 Application topically every other day. 02/21/24   [provider]  naphazoline-glycerin  (CLEAR EYES REDNESS) 0.012-0.25 % SOLN Place 1-2 drops into both eyes 3 (three) times daily with meals. 03/14/23   Love, Sharlet RAMAN, PA-C  pantoprazole  (PROTONIX ) 40 MG tablet Take 1 tablet (40 mg total) by mouth daily. 10/09/23   Pegge Toribio  J, PA-C  rosuvastatin  (CRESTOR ) 40 MG tablet Take 1 tablet (40 mg total) by mouth daily. 10/09/23 06/01/24  Angiulli, Toribio PARAS, PA-C  sertraline  (ZOLOFT ) 50 MG tablet Take 1 tablet (50 mg total) by mouth daily. 10/09/23   Angiulli, Toribio PARAS, PA-C  topiramate  (TOPAMAX ) 25 MG tablet Take 1 tablet (25 mg total) by mouth 2 (two) times daily. 04/04/24   Whitfield Raisin, NP    Allergies: Penicillins    Review of Systems  Constitutional:  Negative for fever.  Respiratory:  Positive for shortness of breath. Negative for  cough and chest tightness.   Cardiovascular:  Positive for chest pain. Negative for palpitations and leg swelling.  Gastrointestinal:  Negative for abdominal pain, diarrhea and vomiting.  Skin:  Negative for rash.  Neurological:  Negative for headaches.    Updated Vital Signs BP (!) 162/96 (BP Location: Left Arm)   Pulse 63   Temp (!) 97.5 F (36.4 C) (Oral)   Resp 17   Ht 5' 3 (1.6 m)   Wt 69.4 kg   SpO2 100%   BMI 27.10 kg/m   Physical Exam Vitals and nursing note reviewed.  Constitutional:      General: She is not in acute distress.    Appearance: Normal appearance. She is not ill-appearing.  HENT:     Head: Normocephalic.     Mouth/Throat:     Mouth: Mucous membranes are moist.  Cardiovascular:     Rate and Rhythm: Normal rate.  Pulmonary:     Effort: Pulmonary effort is normal. No respiratory distress.  Abdominal:     Palpations: Abdomen is soft.     Tenderness: There is no abdominal tenderness.  Skin:    General: Skin is warm.  Neurological:     Mental Status: She is alert and oriented to person, place, and time. Mental status is at baseline.  Psychiatric:        Mood and Affect: Mood normal.     (all labs ordered are listed, but only abnormal results are displayed) Labs Reviewed  BASIC METABOLIC PANEL WITH GFR - Abnormal; Notable for the following components:      Result Value   Chloride 112 (*)    CO2 18 (*)    Glucose, Bld 67 (*)    Creatinine, Ser 1.54 (*)    GFR, Estimated 36 (*)    All other components within normal limits  CBC - Abnormal; Notable for the following components:   WBC 3.8 (*)    RBC 3.10 (*)    Hemoglobin 10.5 (*)    HCT 29.1 (*)    MCHC 36.1 (*)    RDW 16.5 (*)    Platelets 144 (*)    All other components within normal limits  CBG MONITORING, ED - Abnormal; Notable for the following components:   Glucose-Capillary 65 (*)    All other components within normal limits  CBG MONITORING, ED - Abnormal; Notable for the following  components:   Glucose-Capillary 113 (*)    All other components within normal limits  TROPONIN T, HIGH SENSITIVITY - Abnormal; Notable for the following components:   Troponin T High Sensitivity 28 (*)    All other components within normal limits  TROPONIN T, HIGH SENSITIVITY - Abnormal; Notable for the following components:   Troponin T High Sensitivity 26 (*)    All other components within normal limits    EKG: EKG Interpretation Date/Time:  Friday June 07 2024 15:28:29 EST Ventricular Rate:  75 PR  Interval:  150 QRS Duration:  86 QT Interval:  434 QTC Calculation: 484 R Axis:   -56  Text Interpretation: Sinus rhythm with Premature atrial complexes Left axis deviation Cannot rule out Anterior infarct , age undetermined Abnormal ECG When compared with ECG of 23-Feb-2024 00:35, PREVIOUS ECG IS PRESENT Similar to previous Confirmed by Bari Flank [phone]) on 06/07/2024 7:16:01 PM  Radiology: DG Chest 2 View Result Date: 06/07/2024 EXAM: 2 VIEW(S) XRAY OF THE CHEST 06/07/2024 06:28:00 PM COMPARISON: 02/23/2024 CLINICAL HISTORY: chest pain FINDINGS: LINES, TUBES AND DEVICES: Left chest loop recorder in place. LUNGS AND PLEURA: Fine reticular opacities of bilateral lungs with lower lung predominance. Compatible with chronic fibrosis. Stable compared to prior. No pleural effusion. No pneumothorax. HEART AND MEDIASTINUM: No acute abnormality of the cardiac and mediastinal silhouettes. BONES AND SOFT TISSUES: No acute osseous abnormality. IMPRESSION: 1. Fine reticular opacities of the bilateral lungs with lower lung predominance, compatible with chronic fibrosis, stable. 2. Left chest loop recorder in place. Electronically signed by: Dorethia Molt MD 06/07/2024 08:19 PM EST RP Workstation: [id number]     Procedures   Medications Ordered in the ED - No data to display                                  Medical Decision Making Amount and/or Complexity of Data Reviewed Labs:  ordered. Radiology: ordered.   68 year old female presents emergency department after having intermittent episodes of chest pain for the past week, last episode was about 3 days ago.  It self resolved, she is currently symptom-free.  Vitals are stable on arrival.  EKG is similar to previous without any acute changes.  Blood work is baseline/reassuring.  Troponin T is slightly elevated but flat.  Only have troponin on I to compare to.  Given the new finding of elevated troponin I consulted cardiology fellow.  Given that she is currently symptom-free with flat troponins they feel ambulatory referral and outpatient workup is appropriate.  I agree.  Given that she is asymptomatic I have lower suspicion for other acute cardiac/pulmonary diagnoses.  Patient at this time appears safe and stable for discharge and close outpatient follow up. Discharge plan and strict return to ED precautions discussed, patient verbalizes understanding and agreement.     Final diagnoses:  Chest pain, unspecified type    ED Discharge Orders          Ordered    Ambulatory referral to Cardiology        06/07/24 2043               Bari Flank HERO, DO 06/07/24 2056  "

## 2024-06-07 NOTE — ED Triage Notes (Signed)
 Pt bib [facility] from urgent care. Pt complains of chest pain for 1 week. Complains of slight SHOB with the chest pain. Ems reports EKG was SR with pvc's.   158/90  20g L hand

## 2024-06-07 NOTE — ED Notes (Signed)
 I heard this pts glucose monitor beeping so I used nursing judgement and checked her CBG. CBG 65, cranberry juice provided.

## 2024-06-07 NOTE — ED Notes (Signed)
 Pt provided with discharge and follow up instructions, medications discussed, pt verbalized understanding. LDA removed, VSS, pt ambulatory out of ED w/ all paperwork and belongings in NAD.

## 2024-06-09 ENCOUNTER — Ambulatory Visit: Payer: Self-pay | Admitting: Cardiology

## 2024-06-18 DIAGNOSIS — Z1231 Encounter for screening mammogram for malignant neoplasm of breast: Secondary | ICD-10-CM

## 2024-06-24 ENCOUNTER — Ambulatory Visit (INDEPENDENT_AMBULATORY_CARE_PROVIDER_SITE_OTHER): Payer: Self-pay

## 2024-06-24 DIAGNOSIS — I639 Cerebral infarction, unspecified: Secondary | ICD-10-CM

## 2024-06-27 LAB — CUP PACEART REMOTE DEVICE CHECK
Date Time Interrogation Session: 20260115122903
Implantable Pulse Generator Implant Date: 20220706
Pulse Gen Model: 436066
Pulse Gen Serial Number: 94069553

## 2024-06-28 NOTE — Progress Notes (Signed)
 Remote Loop Recorder Transmission

## 2024-06-30 ENCOUNTER — Ambulatory Visit: Payer: Self-pay | Admitting: Cardiology

## 2024-07-16 ENCOUNTER — Telehealth: Payer: Self-pay | Admitting: Adult Health

## 2024-07-16 NOTE — Telephone Encounter (Signed)
 [facility] conf

## 2024-07-17 ENCOUNTER — Encounter: Payer: Self-pay | Admitting: Adult Health

## 2024-07-17 ENCOUNTER — Ambulatory Visit: Admitting: Adult Health

## 2024-07-17 NOTE — Progress Notes (Unsigned)
 " [facility] [address]. [place] [address] [phone]       OFFICE FOLLOW UP NOTE  [name] Date of Birth:  Nov 12, 2005 Medical Record Number:  [id number]   Reason for visit: stroke follow up    SUBJECTIVE:   CHIEF COMPLAINT:  No chief complaint on file.    HPI:   Update 07/17/2024 JM: Patient returns for follow-up visit.  She has been stable without any new stroke/TIA symptoms.    Topiramate  25 mg twice daily   She remains compliant on Plavix , Zetia  and Crestor .  She is no longer taking Pletal .  At prior visit, she reported syncopal event.  Suspected more due to vasovagal based on described symptoms but also possibly postictal symptoms therefore recommended repeat EEG.  She canceled 2 EEG scheduled visits and no-showed 3rd appointment.      History provided for reference purposes only Update 04/04/2024 JM: Patient returns for scheduled follow-up visit but unfortunately had recent admission on 9/11 after presenting to ED with confusion and speech difficulty.  Stroke workup revealed 3 small infarcts involving right cerebellum, thalamus and frontal cortex of unclear etiology, possibly small vessel disease versus cardioembolic source.  CTA negative LVO, carotid Doppler unremarkable.  Loop recorder interrogated without evidence of A-fib.  LDL 28.  A1c 6.2.  On Plavix  PTA and added Pletal  100mg  BID. Continued crestor  and zetia .   Currently, reports overall stable without any new stroke/TIA symptoms. Feels her speech and cognition is worse after recent stroke.  She continues to struggle with swallowing but denies any worsening.  Continued chronic left hemiparesis and gait impairment without worsening.  Recently evaluated by PT.  She has not evaluated by SLP during recent admission. Completed swallowing study back in July with functional oropharyngeal swallowing with mild impairments noted for oral phase of swallow efficiency.  Recommended  dysphagia 3 diet with thin liquids.  No follow-up visits at that time. She continues to experience frequent headaches, she was started on topiramate  at prior visit but she does not believe she is taking. She does live alone, she is looking at getting aide assistance to help with IADLs. She does not drive. She ambulates with a cane, reports she was rx'd a walker during recent admission but has not heard anything else about this.   She is unsure if she has been taking Plavix  and doesn't believe she was taking prior to recent stroke. She has been taking Pletal , Crestor  and Zetia  without side effects. Blood pressure has been stable. Has f/u visit with PCP next month.  Previously discussed for following up with pulmonology and undergoing sleep study but never had this completed, was seen by pulmonology today for IPF, note reviewed  She also mentions a syncopal event on Tuesday while waiting for a bus for her dermatology appointment. Reports she started to feel very warm and diaphoretic and then collapsed to the ground. She woke up to people standing around her. Denies tongue biting or incontinence. She believes she felt confused some after and has been having increased fatigue since. No new stroke/TIA symptoms or worsening headaches.   Update 12/06/2023 JM: Patient returns for scheduled follow-up visit but unfortunately was hospitalized on 09/25/2023 after presenting with right tongue and right groin numbness, difficulty walking and AMS with evidence of left thalamic infarct likely secondary to small vessel disease.  Loop recorder interrogated without evidence of A-fib.  CTA head/neck unremarkable.  EF 55 to 60%.  On aspirin  PTA, recommended DAPT for 3  weeks on Plavix  alone, continue Crestor  40 mg daily and added Zetia  for LDL 78.  A1c 5.6.  Discharged to CIR for 12 days prior to returning home.  Currently, reports residual numbness in lips, tongue and jaw. She can have painful sensation that radiates from side of  mouth into ear at times. She also reports swelling in right jaw, recently saw dentist without concerning findings.  She denies any numbness or weakness in right upper or lower extremity. She does report difficulty swallowing even on saliva, has frequent choking episodes. She did complete swallow study during CIR stay and recommended Dys 2 diet but admits to not maintaining this. Dr. Carilyn referred her to SLP recently, has not yet scheduled appointment. Reports she completed HH SLP and OT last week, has 1 more session of HH PT. She is concerned regarding continued left sided weakness from prior stroke, she also reports continued short term memory difficulty and speech difficulty. Ambulates with cane, no recent falls. Does live alone, trying to get aide assistance as at times she has difficulty fully maintaining ADLs and IADLs.  She does not drive, she took an Pharmacist, [facility] to todays appointment. She also mentions have daily headaches over the past several weeks, located right occipital/parietal area, reports they last until she takes something such as Tylenol  but also reports Tylenol  doesn't seem to help. Denies photophobia, phonophobia or N/V with headaches. Denies vision changes or new/worsening stroke/TIA symptoms since onset of headaches. Completed 3 weeks DAPT, remains on Plavix  alone as well as Crestor  and Zetia . She also reports occasional use of aspirin  as advised by PCP.  Blood pressure well-controlled.  Routinely follows with PCP for stroke risk factor management. She is scheduled for pulmonology consult next week to evaluate for possible underlying sleep apnea.  Update 04/20/2023 JM: Patient returns for hospital follow-up.  She presented to [facility] ED with altered mental status on 03/04/2023. CTH negative for acute stroke.  MRI brain showed small acute to subacute bilateral centrum semiovale infarcts.  MRA brain normal.  Carotid duplex less than 50% stenosis bilateral ICAs, minimal calcified plaque at left ICA  origin.  EF 65 to 70%.  Evaluated by neurology team, suspected infarcts bilateral small vessel disease.  Recommended DAPT for 3 weeks then aspirin  alone and increase Crestor  from 20 mg to 40 mg daily. LDL 86. A1c 6.9.  She was discharged to CIR.  She returned to ED 10/5 after syncopal episode.  Noted orthostatic hypotension likely contributing to syncopal event.  CTH negative. CTA head/neck minimal arthrosclerotic calcifications of bilateral ICAs without significant stenosis otherwise unremarkable.  MRI brain showed acute left thalamic infarct and multiple chronic infarcts including bilateral occipital lobe infarcts, watershed territory infarcts and diffuse subcortical white matter disease, and bilateral basal ganglia and cerebellar infarcts.  She was seen by the neurology team who felt etiology likely small vessel disease.  Recommended DAPT for 21 days and continue Crestor .  Loop recorder interrogated without evidence of A-fib.  She was discharged with home health therapies.  Currently, she reports being stable since discharge.  She reports residual difficulty with speech, worsening left-sided weakness weakness (compared to baseline from prior stroke) and some imbalance. Has not been able to start therapy due to difficulty scheduling.  Ambulates with cane, no recent falls. Lives in own home but does have son and sister to assist as needed.  She does not drive, was brought to todays visit by family, waiting in waiting room. Denies new stroke/TIA symptoms.   She does report  excessive daytime fatigue even prior to recent strokes.  Has difficulty falling asleep at night, will use trazodone  if needed but prefers to avoid if able as this causes night terrors.  Has been told that she snores.  Unsure if witnessed apneas as she sleeps alone.  She did undergo prior sleep study in 2018 but she reports she did not sleep well so they were not able to get accurate results, did review results which did show concern of OSA.    Completed 3 weeks DAPT, remains on aspirin  as advised and Crestor  without side effects.  Routinely follows with PCP for stroke risk factor management - reports starting Actos yesterday by PCP and stopping insulin  but she is concerned glucose levels elevated this morning, she was encouraged to reach out to their office to further discuss.  Blood pressure today slightly elevated, routinely monitors at home and can fluctuate typically ranging 100-140s/70-80s, reports BP managed by PCP. Was seen by cardiology on 11/6, was referred to hematology for hypercoagulable workup given recurrent strokes, has initial visit 12/18.  Loop recorder has not shown evidence of A-fib thus far.  Update 12/02/2021 JM: Patient returns for stroke follow-up after prior visit 7 months ago.  She did present to ED on 6/12 with complaints of worsening left-sided facial numbness and gradual left leg weakness throughout the day as well as walking difficulty on 6/10, due to persisting she proceeded to ED.  Completed MRI brain which was negative for acute stroke.  Symptoms gradually improved prior to discharge. Lab work markable except elevated creatinine level advise follow-up with cardiology to discuss Entresto .  She has been doing since then. Does confirm she returned back to baseline functioning prior to leaving.  Feels as though left-sided weakness has improved since prior visit.  Ambulating with cane, denies any recent falls.  Does still have mild left hand numbness and left lip tingling.  She is concerned regarding her short-term memory which has been poor since her stroke. She will forget recent conversations or walk in to a room and forget why she went in to the room. No issues with long term.  Does do word searches at home. Does admit to some underlying anxiety and difficulty concentrating.  Is currently on sertraline  and venlafaxine  managed by PCP.  Sleep is poor with longstanding insomnia, has sleep study in 2018 which was  negative for sleep apnea (per pt report, unable to view results via epic). Reports her mother had Alzheimer's disease although never formally diagnosed and did have a large stroke in her 54's which was around the time she started to have memory loss. She is fearful that she may be developing Alzheimer's dementia and would like to see a psychologist for such.   Reports compliance on aspirin  and Crestor , denies side effects.  Blood pressure today 145/85.  Reports recent A1c 5.2.  Routinely followed by PCP.   No further concerns at this time  Update 04/29/2021 JM: Returns for 4-month stroke follow-up unaccompanied  Continues to work with therapies making gradual improving of left sided weakness. Continues to use RW and at times her cane. Denies any recent falls.  Denies new stroke/TIA symptoms. Her son continues to live with her but able to maintain ADLs and majority of IADLs independently.   Compliant on aspirin  and crestor  - denies side effects Blood pressure today slightly increased - reports increased stressors today but monitors at home and typically stable Glucose levels stable at home -has not had recent A1c check Recent direct  LDL 37  Plans to contact PCP to schedule follow-up visit Routinely followed by pulmonology for pulmonary fibrosis  No new concerns at this time  Update 12/30/2020 JM: Ms. Lerner returns for hospital follow-up regarding recent stroke on 11/03/2020.  She presented with 2-3 day hx of headaches and worsening left-sided weakness.  Personally reviewed hospitalization pertinent progress notes, lab work and imaging summary provided.  Evaluated by Dr. Rosemarie with stroke work-up revealing right parietal occipital stroke likely due to small vessel disease although unable to rule out cardioembolic source given recent right carotid head and right anterior lateral thalamic infarcts.  MRA negative LVO.  Carotid Dopplers unremarkable.  Normal EF.  Loop recorder recommended but declined by  patient inpatient.  Prior 14-day cardiac monitor completed which was negative for A. fib.  On Plavix  PTA and recommended DAPT for 3 weeks and aspirin  alone.  HTN stable.  LDL 56 on Crestor  20 mg daily.  Controlled DM with A1c 5.2.  Other stroke risk factors include recent stroke (see below), advanced age, former tobacco use and CAD.  Discharged to CIR on 11/06/2020 -11/21/2020.  Since discharge, she has been making gradual improvements.  Currently working with home health PT for residual left-sided weakness and left sided numbness. She has since completed SLP. She has also been having headaches since her stroke - has been taking tramadol  with benefit which was started by PMR.  Continues to ambulate with RW at all times -denies any recent falls. Her son is currently living with her. Able to maintain ADLs independently. Completed 3 weeks DAPT and remains on aspirin  alone as well as Crestor  without associated side effects.  Blood pressure today 128/79.  Had loop recorder placed by Dr. Michele 7/6.   Sister jointed visit towards the end of visit - she is concerned regarding worsening depression and living situation as son does not assist with patients care.  She has not had recent visit with PCP.  She is currently on trazodone , venlafaxine , and buspirone   Initial visit 08/19/2020 JM: Ms. Mcglothlin is being seen for hospital follow-up accompanied by her son.  She was discharged home from CIR on 07/15/2020.  Reports residual left sided weakness with numbness/tingling and left lower facial numbness Working with [facility] PT/OT 2x/ week -reports continued improvement since discharge Currently ambulating with rolling walker but she was not using previously -denies any recent falls Lives with her son who will assist as needed but she has been slowly becoming more independent with ADLs Denies new stroke/TIA symptoms  Completed 3 weeks DAPT and remains on Plavix  alone -denies side effects Reports compliance on atorvastatin  40 mg  daily -denies side effects Blood pressure today satisfactory 127/78 -monitors at home and typically stable Glucose levels routinely monitored at home which have been fluctuating per patient report  She has not had follow-up with PCP or cardiology since discharge  No further concerns at this time  Stroke admission 06/25/2020: Ms. VALLERIE HENTZ is a 69 y.o. female with history of hypertension, hypercholesteremia, diabetes complicated by neuropathy, CAD, depression C/V prior suicide attempt, arthritis, 10-pack-year history of smoking quit in 2020, COPD/asthma, polysubstance abuse who presented to [facility] ED on 06/25/2020 with slurred speech on and off for past week and left leg weakness.    Personally reviewed hospitalization pertinent progress notes, lab work and imaging with summary provided.  Evaluated by Dr. Jerri with stroke work-up revealing right caudate head and right anterior lateral thalamic infarcts, likely secondary to small vessel disease however given 2 separate  locations unable to rule out embolic source with 30-day cardiac event monitor outpatient.  Recommended DAPT for 3 weeks and Plavix  alone.  LDL 86 -increase atorvastatin  from 20 mg to 40 mg daily and ongoing use of fenofibrate  and Vascepa .  Controlled DM with A1c 5.2.  Other stroke risk factors include advanced age, obesity, CAD, hx of cocaine use and former tobacco use.  Residual deficits of left hemiparesis and left hemisensory impairment.  Evaluated by therapies and discharged to CIR for ongoing therapy needs  Stroke: Lacunar infarcts in right caudate head and right anterior lateral thalamus, likely due to small vessel disease. However, given two separate location will need to rule out emoblic source CT head: Lacunar infarcts in the right caudate and right cerebellum, new from 2012, but age indeterminate. MRI : 1 cm acute infarction in the anterolateral thalamus on the right, possibly with some involvement of the posterior limb internal  capsule. Acute infarction within the right caudate head.  MRA : Both internal carotid arteries widely patent through the skull base and siphon regions. No stenosis.  Carotid Doppler: Velocities in the left and right ICA are consistent with a 1-39%  stenosis.  2D Echo: Left ventricular ejection fraction, by estimation, is 55 to 60%.  30 day cardiac event monitoring as outpt to rule out afib LDL 86 HgbA1c 5.2 RPR/HIV neg VTE prophylaxis -Lovenox  30 mg aspirin  81 mg daily prior to admission, now on aspirin  81 mg daily and Plavix  DAPT for 3 weeks, then Plavix  75 mg alone.  Therapy recommendations: CIR Disposition: d/c to CIR on 06/30/2020       ROS:   14 system review of systems performed and negative with exception of those listed in HPI  PMH:  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   GERD (gastroesophageal reflux disease)    with chronic cough   Headache(784.0)    mild   Hypertension    Loop Biotronik loop implant 12/16/2020 12/16/2020   Mental disorder    depression   Neuropathy    feet    Pain    arthritis pain - takes tramadol  as needed   Pulmonary fibrosis, unspecified (HCC)    Rectal cancer (HCC)    Rectal polyp 2014   very little bleeding with bowel movements- no pain   Stroke (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   Suicide attempt (HCC)     PSH:  Past Surgical History:  Procedure Laterality Date   ANAL RECTAL MANOMETRY N/A 07/13/2016   Procedure: ANO RECTAL MANOMETRY;  Surgeon: Bernarda Ned, MD;  Location: [facility];  Service: Endoscopy;  Laterality: N/A;   CESAREAN SECTION     EUS N/A 11/21/2012   Procedure: LOWER ENDOSCOPIC ULTRASOUND (EUS);  Surgeon: Elsie Cree, MD;  Location: [facility];  Service: Endoscopy;  Laterality: N/A;   FLEXIBLE SIGMOIDOSCOPY N/A 11/21/2012   Procedure: FLEXIBLE  SIGMOIDOSCOPY;  Surgeon: Elsie Cree, MD;  Location: [facility];  Service: Endoscopy;  Laterality: N/A;   FLEXIBLE SIGMOIDOSCOPY N/A 01/28/2013   Procedure: FLEXIBLE SIGMOIDOSCOPY;  Surgeon: Bernarda Ned, MD;  Location: [facility];  Service: Endoscopy;  Laterality: N/A;   LAPAROSCOPIC LOW ANTERIOR RESECTION N/A 01/29/2013   Procedure: LAPAROSCOPIC LOW ANTERIOR RESECTION, Rigid Proctoscopy;  Surgeon: Bernarda Ned, MD;  Location: [facility];  Service: General;  Laterality: N/A;  LAPAROSCOPIC SIGMOID COLECTOMY N/A 11/14/2012   Procedure: DIAGNOSTIC LAPAROSCOPY AND SIGMOIDMOIDOSCOPY ;  Surgeon: Donnice Bury, MD;  Location: [facility];  Service: General;  Laterality: N/A;   RECTAL ULTRASOUND N/A 07/13/2016   Procedure: RECTAL ULTRASOUND;  Surgeon: Bernarda Ned, MD;  Location: [facility];  Service: Endoscopy;  Laterality: N/A;   TONSILLECTOMY     TONSILLECTOMY AND ADENOIDECTOMY      Social History:  Social History   Socioeconomic History   Marital status: Single    Spouse name: Not on file   Number of children: 1   Years of education: Not on file   Highest education level: Not on file  Occupational History   Not on file  Tobacco Use   Smoking status: Former    Current packs/day: 0.00    Average packs/day: 0.5 packs/day for 20.0 years (10.0 ttl pk-yrs)    Types: Cigarettes    Start date: 01/05/1998    Quit date: 01/05/2018    Years since quitting: 6.5   Smokeless tobacco: Never   Tobacco comments:    Unknown   Vaping Use   Vaping status: Never Used  Substance and Sexual Activity   Alcohol  use: Not Currently   Drug use: Not Currently    Types: Crack cocaine    Comment: Unknown    Sexual activity: Not Currently    Birth control/protection: None  Other Topics Concern   Not on file  Social History Narrative   12/30/20 lives with others   Social Drivers of Health   Tobacco Use: Medium Risk (06/07/2024)   Patient History    Smoking Tobacco Use: Former    Smokeless  Tobacco Use: Never    Passive Exposure: Not on Actuary Strain: Not on file  Food Insecurity: No Food Insecurity (02/23/2024)   Epic    Worried About Programme Researcher, Broadcasting/film/video in the Last Year: Never true    Ran Out of Food in the Last Year: Never true  Transportation Needs: No Transportation Needs (02/23/2024)   Epic    Lack of Transportation (Medical): No    Lack of Transportation (Non-Medical): No  Physical Activity: Not on file  Stress: Not on file  Social Connections: Moderately Isolated (02/23/2024)   Social Connection and Isolation Panel    Frequency of Communication with Friends and Family: Once a week    Frequency of Social Gatherings with Friends and Family: Once a week    Attends Religious Services: 1 to 4 times per year    Active Member of Clubs or Organizations: Yes    Attends Banker Meetings: 1 to 4 times per year    Marital Status: Divorced  Intimate Partner Violence: Not At Risk (02/23/2024)   Epic    Fear of Current or Ex-Partner: No    Emotionally Abused: No    Physically Abused: No    Sexually Abused: No  Depression (PHQ2-9): Low Risk (12/01/2023)   Depression (PHQ2-9)    PHQ-2 Score: 2  Recent Concern: Depression (PHQ2-9) - Medium Risk (10/19/2023)   Depression (PHQ2-9)    PHQ-2 Score: 6  Alcohol  Screen: Not on file  Housing: Low Risk (02/23/2024)   Epic    Unable to Pay for Housing in the Last Year: No    Number of Times Moved in the Last Year: 0    Homeless in the Last Year: No  Utilities: Not At Risk (02/23/2024)   Epic    Threatened with loss of utilities: No  Health  Literacy: Not on file    Family History:  Family History  Problem Relation Age of Onset   Heart disease Mother    Hypertension Mother    Hyperlipidemia Mother    Hypertension Father    Stroke Father    Diabetes Father    Hypertension Sister    Hypertension Sister    Cancer Maternal Aunt        cancer, unknown type    Bone cancer Maternal Uncle    Colon  cancer Maternal Uncle    Prostate cancer Maternal Uncle    Prostate cancer Maternal Uncle    Lung cancer Paternal Aunt    Lung cancer Paternal Uncle    Hypertension Brother    Hypertension Brother    Colon cancer Other 47   Breast cancer Neg Hx    BRCA 1/2 Neg Hx     Medications:   Current Outpatient Medications on File Prior to Visit  Medication Sig Dispense Refill   acetaminophen  (TYLENOL ) 325 MG tablet Take 2 tablets (650 mg total) by mouth every 4 (four) hours as needed for mild pain (pain score 1-3) (or temp > 37.5 C (99.5 F)).     albuterol  (VENTOLIN  HFA) 108 (90 Base) MCG/ACT inhaler Inhale 2 puffs into the lungs every 4 (four) hours as needed for wheezing. 6.7 g 5   amLODipine  (NORVASC ) 10 MG tablet Take 1 tablet (10 mg total) by mouth daily. 30 tablet 0   benzoyl peroxide-erythromycin (BENZAMYCIN) gel Apply 1 Application topically every other day.     budesonide -formoterol  (SYMBICORT ) 160-4.5 MCG/ACT inhaler Inhale 2 puffs into the lungs 2 (two) times daily as needed (Asthma).     cilostazol  (PLETAL ) 100 MG tablet Take 1 tablet (100 mg total) by mouth 2 (two) times daily. 60 tablet 0   clindamycin  (CLEOCIN  T) 1 % lotion Apply 1 Application topically daily as needed (Irritation/Acne).     clopidogrel  (PLAVIX ) 75 MG tablet Take 1 tablet (75 mg total) by mouth daily. 90 tablet 1   dapagliflozin  propanediol (FARXIGA ) 10 MG TABS tablet Take 1 tablet (10 mg total) by mouth daily. 30 tablet 0   diclofenac  Sodium (VOLTAREN ) 1 % GEL Apply 1 Application topically 2 (two) times daily as needed (pain). 100 g 0   ezetimibe  (ZETIA ) 10 MG tablet Take 1 tablet (10 mg total) by mouth daily. 30 tablet 0   gabapentin  (NEURONTIN ) 100 MG capsule Take 1 capsule (100 mg total) by mouth 3 (three) times daily. 90 capsule 0   hydrALAZINE  (APRESOLINE ) 25 MG tablet Take 37.5 mg by mouth 3 (three) times daily.     insulin  glargine (LANTUS ) 100 UNIT/ML Solostar Pen Inject 10 Units into the skin daily. Pen  expires 28 days after first use 15 mL 11   ketoconazole (NIZORAL) 2 % cream SMARTSIG:1 Application Topical 1 to 2 Times Daily     metroNIDAZOLE  (METROCREAM ) 0.75 % cream Apply 1 Application topically every other day.     naphazoline-glycerin  (CLEAR EYES REDNESS) 0.012-0.25 % SOLN Place 1-2 drops into both eyes 3 (three) times daily with meals.     pantoprazole  (PROTONIX ) 40 MG tablet Take 1 tablet (40 mg total) by mouth daily. 30 tablet 0   rosuvastatin  (CRESTOR ) 40 MG tablet Take 1 tablet (40 mg total) by mouth daily. 30 tablet 0   sertraline  (ZOLOFT ) 50 MG tablet Take 1 tablet (50 mg total) by mouth daily. 30 tablet 0   topiramate  (TOPAMAX ) 25 MG tablet Take 1 tablet (25 mg total)  by mouth 2 (two) times daily. 60 tablet 8   No current facility-administered medications on file prior to visit.    Allergies:   Allergies  Allergen Reactions   Penicillins Hives, Itching and Swelling    Tongue swelling      OBJECTIVE:  Physical Exam  There were no vitals filed for this visit.  There is no height or weight on file to calculate BMI. No results found.  General: well developed, well nourished,  very pleasant elderly African-American female, seated, in no evident distress  Neurologic Exam Mental Status: Awake and fully alert.  Mild dysarthria with speech hesitancy and hypophonia.  No evidence of aphasia.  Oriented to place and time. Recent memory impaired and remote memory intact. Attention span, concentration and fund of knowledge appropriate during visit. Mood and affect appropriate.  Cranial Nerves: Pupils equal, briskly reactive to light. Extraocular movements full without nystagmus. Visual fields full to confrontation. Hearing intact. Slight left lower facial weakness.  Tongue and palate moves normally and symmetrically.  Motor: Normal bulk and tone and normal strength on the right side.  4/5 left hemiparesis although questionable effort and some giveaway weakness Sensory.:  Intact to  upper and lower extremities bilaterally Coordination: Rapid alternating movements impaired L>R.  Gait and Station: Arises from chair without difficulty. Stance is normal. Gait demonstrates slightly decreased step height of LLE with use of cane and foot up brace, mild unsteadiness initially.  Unable to complete tandem walk or heel toe.   Reflexes: 1+ and symmetric. Toes downgoing.          ASSESSMENT: Erica [name] is a 69 y.o. year old female with multiple recurrent strokes,   02/2024  right cerebellum, thalamus and frontal cortex, unclear cause, SVD vs cardioembolic source  10/7972 left thalamic stroke likely secondary to small vessel disease 03/2023 left thalamic infarct likely secondary to small vessel disease,  02/2023 bilateral centrum semiovale infarcts 06/2020 and 10/2020 recurrent right MCA strokes llikely in setting of small vessel disease although unable to rule out cardioembolic source s/p Loop recorder 12/2020 by Dr. Michele [facility] cardiology).    Vascular risk factors include HTN, HLD, DM, CAD, history of tobacco use and history of polysubstance abuse and multiple prior strokes noted on imaging.       PLAN:  History of multiple strokes:  Residual deficit: Worsening dysarthria and cognition after recent stroke.  Chronic mild left hemiparesis, dysphagia and gait impairment Continue working with PT Referral placed to SLP for worsening dysarthria and cognition and ongoing dysphagia DME order placed to obtain rollator walker for prolonged ambulation and she does have difficulty ambulating for prolonged period of time Advised to restart Plavix  and continue Pletal . Will contact pharmacy regarding last fill of Plavix . If she was noncompliant prior to recent stroke, can discontinue Pletal  and continue on Plavix  alone. Continue Crestor  and Zetia . Request ongoing refills by PCP. S/p ILR 12/16/2020 - monitored by [facility] cardiology Dr. Michele.  No evidence of A-fib thus far Hypercoag  workup negative for underlying clotting disorder discussed secondary stroke prevention measures and importance of close PCP follow up for aggressive stroke risk factor management including BP goal<130/90, HLD with LDL goal<70 and DM with A1c.<7  Stroke labs 02/2024 LDL 28, A1c 6.2  Tension type headaches: Continued frequent headaches Unclear if taking Topamax , new order placed. Advised to call if headaches should persist  Advised to limit use of OTC pain relieves as this can cause rebound headache Currently on gabapentin  and Lexapro   At  risk for sleep apnea:  Advised to follow back up with pulmonology regarding further evaluation of possible sleep apnea.  Discussed risk of untreated sleep apnea and importance of pursing this evaluation especially in setting of recurrent strokes  Syncope: Reports recent episode on 10/21 after standing waiting for a bus, become warm and diaphoretic prior to passing out Suspect more vasovagal but she does report some confusion upon coming to and increased fatigue since, no tongue biting or incontinence Will repeat EEG to rule out seizures and complete BMP DME order placed to obtain rollator walker for longer distance, possibly could have been avoided if she was able to sit Advised to contact PCP if this should occur again, may need further eval by cardiology       Follow-up in 3-4 months or call earlier if needed    CC:  PCP: Delores Rojelio Caldron, NP    I personally spent a total of 50 minutes in the care of the patient today including preparing to see the patient, getting/reviewing separately obtained history, performing a medically appropriate exam/evaluation, counseling and educating, placing orders, and documenting clinical information in the EHR. Time also spend reviewing recent hospitalization.  Harlene Bogaert, AGNP-BC  [facility] [address] [place], [place] [address]  Phone [phone] Fax  [phone] Note: This document was prepared with digital dictation and possible smart phrase technology. Any transcriptional errors that result from this process are unintentional.     "

## 2024-07-25 ENCOUNTER — Ambulatory Visit: Admitting: Adult Health

## 2024-08-05 ENCOUNTER — Ambulatory Visit: Admitting: Cardiology

## 2024-12-23 ENCOUNTER — Ambulatory Visit
